# Patient Record
Sex: Female | Born: 1970
Health system: Southern US, Community
[De-identification: ages and names within clinical notes are randomized; demographics above are authoritative.]

## PROBLEM LIST (undated history)

## (undated) ENCOUNTER — Emergency Department (HOSPITAL_COMMUNITY): Admission: EM | Payer: Medicare Other | Source: Home / Self Care

## (undated) DIAGNOSIS — K589 Irritable bowel syndrome without diarrhea: Secondary | ICD-10-CM

## (undated) DIAGNOSIS — I509 Heart failure, unspecified: Secondary | ICD-10-CM

## (undated) DIAGNOSIS — F418 Other specified anxiety disorders: Secondary | ICD-10-CM

## (undated) DIAGNOSIS — D649 Anemia, unspecified: Secondary | ICD-10-CM

## (undated) DIAGNOSIS — I10 Essential (primary) hypertension: Secondary | ICD-10-CM

## (undated) DIAGNOSIS — G629 Polyneuropathy, unspecified: Secondary | ICD-10-CM

## (undated) DIAGNOSIS — M797 Fibromyalgia: Secondary | ICD-10-CM

## (undated) DIAGNOSIS — L732 Hidradenitis suppurativa: Secondary | ICD-10-CM

## (undated) DIAGNOSIS — K219 Gastro-esophageal reflux disease without esophagitis: Secondary | ICD-10-CM

## (undated) DIAGNOSIS — R0989 Other specified symptoms and signs involving the circulatory and respiratory systems: Secondary | ICD-10-CM

## (undated) DIAGNOSIS — J381 Polyp of vocal cord and larynx: Secondary | ICD-10-CM

## (undated) DIAGNOSIS — M545 Low back pain, unspecified: Secondary | ICD-10-CM

## (undated) DIAGNOSIS — G43909 Migraine, unspecified, not intractable, without status migrainosus: Secondary | ICD-10-CM

## (undated) DIAGNOSIS — K635 Polyp of colon: Secondary | ICD-10-CM

## (undated) DIAGNOSIS — F410 Panic disorder [episodic paroxysmal anxiety] without agoraphobia: Secondary | ICD-10-CM

## (undated) DIAGNOSIS — R600 Localized edema: Secondary | ICD-10-CM

## (undated) DIAGNOSIS — N189 Chronic kidney disease, unspecified: Secondary | ICD-10-CM

## (undated) DIAGNOSIS — G709 Myoneural disorder, unspecified: Secondary | ICD-10-CM

## (undated) DIAGNOSIS — R630 Anorexia: Secondary | ICD-10-CM

## (undated) DIAGNOSIS — I639 Cerebral infarction, unspecified: Secondary | ICD-10-CM

## (undated) HISTORY — DX: Polyp of vocal cord and larynx: J38.1

## (undated) HISTORY — DX: Irritable bowel syndrome, unspecified: K58.9

## (undated) HISTORY — DX: Myoneural disorder, unspecified: G70.9

## (undated) HISTORY — PX: AXILLARY HIDRADENITIS EXCISION: SUR522

## (undated) HISTORY — DX: Polyp of colon: K63.5

## (undated) HISTORY — DX: Fibromyalgia: M79.7

## (undated) HISTORY — DX: Low back pain: M54.5

## (undated) HISTORY — DX: Anemia, unspecified: D64.9

## (undated) HISTORY — DX: Low back pain, unspecified: M54.50

## (undated) HISTORY — PX: UPPER GASTROINTESTINAL ENDOSCOPY: SHX188

## (undated) HISTORY — PX: INGUINAL HIDRADENITIS EXCISION: SHX1827

## (undated) HISTORY — DX: Polyneuropathy, unspecified: G62.9

## (undated) HISTORY — DX: Panic disorder (episodic paroxysmal anxiety): F41.0

## (undated) HISTORY — DX: Essential (primary) hypertension: I10

## (undated) HISTORY — DX: Migraine, unspecified, not intractable, without status migrainosus: G43.909

## (undated) HISTORY — PX: HEMORRHOID SURGERY: SHX153

## (undated) HISTORY — DX: Morbid (severe) obesity due to excess calories: E66.01

## (undated) HISTORY — DX: Gastro-esophageal reflux disease without esophagitis: K21.9

## (undated) HISTORY — DX: Localized edema: R60.0

## (undated) HISTORY — DX: Anorexia: R63.0

## (undated) HISTORY — DX: Other specified symptoms and signs involving the circulatory and respiratory systems: R09.89

## (undated) HISTORY — DX: Other specified anxiety disorders: F41.8

## (undated) HISTORY — DX: Hidradenitis suppurativa: L73.2

---

## 1997-03-12 ENCOUNTER — Inpatient Hospital Stay (HOSPITAL_COMMUNITY): Admission: AD | Admit: 1997-03-12 | Discharge: 1997-03-13 | Payer: Self-pay | Admitting: Obstetrics

## 1997-03-24 ENCOUNTER — Ambulatory Visit (HOSPITAL_COMMUNITY): Admission: RE | Admit: 1997-03-24 | Discharge: 1997-03-24 | Payer: Self-pay | Admitting: Obstetrics

## 1997-05-09 ENCOUNTER — Inpatient Hospital Stay (HOSPITAL_COMMUNITY): Admission: AD | Admit: 1997-05-09 | Discharge: 1997-05-09 | Payer: Self-pay | Admitting: Obstetrics

## 1997-05-18 ENCOUNTER — Ambulatory Visit (HOSPITAL_COMMUNITY): Admission: RE | Admit: 1997-05-18 | Discharge: 1997-05-18 | Payer: Self-pay | Admitting: Obstetrics

## 1997-07-14 ENCOUNTER — Other Ambulatory Visit: Admission: RE | Admit: 1997-07-14 | Discharge: 1997-07-14 | Payer: Self-pay | Admitting: Obstetrics

## 1997-07-19 ENCOUNTER — Emergency Department (HOSPITAL_COMMUNITY): Admission: EM | Admit: 1997-07-19 | Discharge: 1997-07-19 | Payer: Self-pay | Admitting: Emergency Medicine

## 1997-08-16 ENCOUNTER — Inpatient Hospital Stay (HOSPITAL_COMMUNITY): Admission: AD | Admit: 1997-08-16 | Discharge: 1997-08-19 | Payer: Self-pay | Admitting: Obstetrics

## 1998-01-12 ENCOUNTER — Encounter: Admission: RE | Admit: 1998-01-12 | Discharge: 1998-01-12 | Payer: Self-pay | Admitting: Hematology and Oncology

## 1998-04-06 ENCOUNTER — Ambulatory Visit (HOSPITAL_BASED_OUTPATIENT_CLINIC_OR_DEPARTMENT_OTHER): Admission: RE | Admit: 1998-04-06 | Discharge: 1998-04-06 | Payer: Self-pay | Admitting: General Surgery

## 1998-05-31 ENCOUNTER — Encounter: Admission: RE | Admit: 1998-05-31 | Discharge: 1998-05-31 | Payer: Self-pay | Admitting: Hematology and Oncology

## 1998-06-09 ENCOUNTER — Encounter: Admission: RE | Admit: 1998-06-09 | Discharge: 1998-06-09 | Payer: Self-pay | Admitting: Internal Medicine

## 1998-06-12 ENCOUNTER — Encounter: Payer: Self-pay | Admitting: *Deleted

## 1998-06-12 ENCOUNTER — Ambulatory Visit (HOSPITAL_COMMUNITY): Admission: RE | Admit: 1998-06-12 | Discharge: 1998-06-12 | Payer: Self-pay | Admitting: *Deleted

## 1998-06-12 ENCOUNTER — Encounter: Admission: RE | Admit: 1998-06-12 | Discharge: 1998-06-12 | Payer: Self-pay | Admitting: Internal Medicine

## 1998-06-27 ENCOUNTER — Encounter: Admission: RE | Admit: 1998-06-27 | Discharge: 1998-06-27 | Payer: Self-pay | Admitting: Obstetrics

## 1998-07-10 ENCOUNTER — Other Ambulatory Visit: Admission: RE | Admit: 1998-07-10 | Discharge: 1998-07-10 | Payer: Self-pay | Admitting: Obstetrics

## 1998-08-07 ENCOUNTER — Ambulatory Visit (HOSPITAL_COMMUNITY): Admission: RE | Admit: 1998-08-07 | Discharge: 1998-08-07 | Payer: Self-pay | Admitting: General Surgery

## 1998-08-07 ENCOUNTER — Encounter (HOSPITAL_BASED_OUTPATIENT_CLINIC_OR_DEPARTMENT_OTHER): Payer: Self-pay | Admitting: General Surgery

## 1998-08-18 ENCOUNTER — Ambulatory Visit (HOSPITAL_COMMUNITY): Admission: RE | Admit: 1998-08-18 | Discharge: 1998-08-18 | Payer: Self-pay | Admitting: *Deleted

## 1998-09-01 ENCOUNTER — Encounter: Payer: Self-pay | Admitting: *Deleted

## 1998-09-01 ENCOUNTER — Ambulatory Visit (HOSPITAL_COMMUNITY): Admission: RE | Admit: 1998-09-01 | Discharge: 1998-09-01 | Payer: Self-pay | Admitting: *Deleted

## 1998-11-01 ENCOUNTER — Inpatient Hospital Stay (HOSPITAL_COMMUNITY): Admission: AD | Admit: 1998-11-01 | Discharge: 1998-11-01 | Payer: Self-pay | Admitting: *Deleted

## 1999-01-03 ENCOUNTER — Encounter: Payer: Self-pay | Admitting: Emergency Medicine

## 1999-01-03 ENCOUNTER — Emergency Department (HOSPITAL_COMMUNITY): Admission: EM | Admit: 1999-01-03 | Discharge: 1999-01-03 | Payer: Self-pay | Admitting: Emergency Medicine

## 1999-01-10 ENCOUNTER — Encounter: Admission: RE | Admit: 1999-01-10 | Discharge: 1999-01-10 | Payer: Self-pay | Admitting: Internal Medicine

## 1999-01-13 ENCOUNTER — Emergency Department (HOSPITAL_COMMUNITY): Admission: EM | Admit: 1999-01-13 | Discharge: 1999-01-13 | Payer: Self-pay | Admitting: Emergency Medicine

## 1999-02-09 ENCOUNTER — Encounter: Admission: RE | Admit: 1999-02-09 | Discharge: 1999-02-09 | Payer: Self-pay | Admitting: Internal Medicine

## 1999-03-07 ENCOUNTER — Ambulatory Visit (HOSPITAL_COMMUNITY): Admission: RE | Admit: 1999-03-07 | Discharge: 1999-03-07 | Payer: Self-pay | Admitting: Internal Medicine

## 1999-03-09 ENCOUNTER — Encounter: Admission: RE | Admit: 1999-03-09 | Discharge: 1999-03-09 | Payer: Self-pay | Admitting: Internal Medicine

## 1999-04-06 ENCOUNTER — Encounter: Admission: RE | Admit: 1999-04-06 | Discharge: 1999-04-06 | Payer: Self-pay | Admitting: Internal Medicine

## 1999-05-04 ENCOUNTER — Encounter: Admission: RE | Admit: 1999-05-04 | Discharge: 1999-05-04 | Payer: Self-pay | Admitting: Hematology and Oncology

## 1999-05-11 ENCOUNTER — Encounter (INDEPENDENT_AMBULATORY_CARE_PROVIDER_SITE_OTHER): Payer: Self-pay | Admitting: Specialist

## 1999-05-12 ENCOUNTER — Observation Stay (HOSPITAL_COMMUNITY): Admission: EM | Admit: 1999-05-12 | Discharge: 1999-05-12 | Payer: Self-pay | Admitting: Emergency Medicine

## 1999-07-30 ENCOUNTER — Encounter: Admission: RE | Admit: 1999-07-30 | Discharge: 1999-07-30 | Payer: Self-pay | Admitting: Internal Medicine

## 1999-08-03 ENCOUNTER — Inpatient Hospital Stay (HOSPITAL_COMMUNITY): Admission: EM | Admit: 1999-08-03 | Discharge: 1999-08-03 | Payer: Self-pay | Admitting: *Deleted

## 1999-08-23 ENCOUNTER — Encounter: Admission: RE | Admit: 1999-08-23 | Discharge: 1999-08-23 | Payer: Self-pay | Admitting: Hematology and Oncology

## 2000-03-04 ENCOUNTER — Encounter: Admission: RE | Admit: 2000-03-04 | Discharge: 2000-03-04 | Payer: Self-pay | Admitting: Hematology and Oncology

## 2000-03-13 ENCOUNTER — Encounter: Admission: RE | Admit: 2000-03-13 | Discharge: 2000-03-13 | Payer: Self-pay

## 2000-03-15 ENCOUNTER — Emergency Department (HOSPITAL_COMMUNITY): Admission: EM | Admit: 2000-03-15 | Discharge: 2000-03-15 | Payer: Self-pay | Admitting: Emergency Medicine

## 2000-03-15 ENCOUNTER — Encounter: Payer: Self-pay | Admitting: Emergency Medicine

## 2000-03-17 ENCOUNTER — Encounter: Admission: RE | Admit: 2000-03-17 | Discharge: 2000-03-17 | Payer: Self-pay | Admitting: Internal Medicine

## 2000-03-18 ENCOUNTER — Encounter: Admission: RE | Admit: 2000-03-18 | Discharge: 2000-03-18 | Payer: Self-pay | Admitting: Obstetrics & Gynecology

## 2000-03-18 ENCOUNTER — Ambulatory Visit (HOSPITAL_COMMUNITY): Admission: RE | Admit: 2000-03-18 | Discharge: 2000-03-18 | Payer: Self-pay | Admitting: Obstetrics

## 2000-03-19 ENCOUNTER — Encounter: Admission: RE | Admit: 2000-03-19 | Discharge: 2000-03-19 | Payer: Self-pay | Admitting: Internal Medicine

## 2000-04-30 ENCOUNTER — Encounter: Admission: RE | Admit: 2000-04-30 | Discharge: 2000-07-29 | Payer: Self-pay | Admitting: *Deleted

## 2000-05-01 ENCOUNTER — Encounter: Admission: RE | Admit: 2000-05-01 | Discharge: 2000-05-01 | Payer: Self-pay | Admitting: Hematology and Oncology

## 2000-05-22 ENCOUNTER — Encounter: Admission: RE | Admit: 2000-05-22 | Discharge: 2000-05-22 | Payer: Self-pay | Admitting: Hematology and Oncology

## 2000-05-23 ENCOUNTER — Ambulatory Visit (HOSPITAL_COMMUNITY): Admission: RE | Admit: 2000-05-23 | Discharge: 2000-05-23 | Payer: Self-pay | Admitting: Internal Medicine

## 2000-05-23 ENCOUNTER — Encounter: Payer: Self-pay | Admitting: Internal Medicine

## 2000-06-04 ENCOUNTER — Encounter: Admission: RE | Admit: 2000-06-04 | Discharge: 2000-06-04 | Payer: Self-pay | Admitting: Hematology and Oncology

## 2000-07-11 ENCOUNTER — Encounter: Admission: RE | Admit: 2000-07-11 | Discharge: 2000-07-11 | Payer: Self-pay | Admitting: Internal Medicine

## 2000-08-21 ENCOUNTER — Encounter: Admission: RE | Admit: 2000-08-21 | Discharge: 2000-08-21 | Payer: Self-pay | Admitting: Internal Medicine

## 2000-09-07 ENCOUNTER — Emergency Department (HOSPITAL_COMMUNITY): Admission: EM | Admit: 2000-09-07 | Discharge: 2000-09-07 | Payer: Self-pay | Admitting: Emergency Medicine

## 2000-09-11 ENCOUNTER — Encounter: Admission: RE | Admit: 2000-09-11 | Discharge: 2000-09-11 | Payer: Self-pay | Admitting: Internal Medicine

## 2000-10-08 ENCOUNTER — Encounter: Admission: RE | Admit: 2000-10-08 | Discharge: 2000-10-08 | Payer: Self-pay | Admitting: Internal Medicine

## 2000-12-23 ENCOUNTER — Encounter: Admission: RE | Admit: 2000-12-23 | Discharge: 2000-12-23 | Payer: Self-pay | Admitting: Internal Medicine

## 2000-12-25 ENCOUNTER — Emergency Department (HOSPITAL_COMMUNITY): Admission: EM | Admit: 2000-12-25 | Discharge: 2000-12-25 | Payer: Self-pay | Admitting: Emergency Medicine

## 2000-12-25 ENCOUNTER — Encounter: Payer: Self-pay | Admitting: Emergency Medicine

## 2001-03-11 ENCOUNTER — Encounter: Payer: Self-pay | Admitting: Emergency Medicine

## 2001-03-11 ENCOUNTER — Emergency Department (HOSPITAL_COMMUNITY): Admission: EM | Admit: 2001-03-11 | Discharge: 2001-03-11 | Payer: Self-pay | Admitting: Emergency Medicine

## 2001-03-31 ENCOUNTER — Encounter: Admission: RE | Admit: 2001-03-31 | Discharge: 2001-03-31 | Payer: Self-pay | Admitting: Internal Medicine

## 2001-07-30 ENCOUNTER — Encounter: Admission: RE | Admit: 2001-07-30 | Discharge: 2001-07-30 | Payer: Self-pay | Admitting: Internal Medicine

## 2001-07-30 ENCOUNTER — Ambulatory Visit (HOSPITAL_COMMUNITY): Admission: RE | Admit: 2001-07-30 | Discharge: 2001-07-30 | Payer: Self-pay | Admitting: Internal Medicine

## 2001-07-30 ENCOUNTER — Encounter: Payer: Self-pay | Admitting: Internal Medicine

## 2001-09-11 ENCOUNTER — Emergency Department (HOSPITAL_COMMUNITY): Admission: EM | Admit: 2001-09-11 | Discharge: 2001-09-11 | Payer: Self-pay | Admitting: Emergency Medicine

## 2001-09-13 ENCOUNTER — Emergency Department (HOSPITAL_COMMUNITY): Admission: EM | Admit: 2001-09-13 | Discharge: 2001-09-13 | Payer: Self-pay | Admitting: Emergency Medicine

## 2001-09-26 ENCOUNTER — Emergency Department (HOSPITAL_COMMUNITY): Admission: EM | Admit: 2001-09-26 | Discharge: 2001-09-27 | Payer: Self-pay | Admitting: Emergency Medicine

## 2001-09-27 ENCOUNTER — Emergency Department (HOSPITAL_COMMUNITY): Admission: EM | Admit: 2001-09-27 | Discharge: 2001-09-27 | Payer: Self-pay | Admitting: Emergency Medicine

## 2001-12-08 ENCOUNTER — Encounter: Admission: RE | Admit: 2001-12-08 | Discharge: 2001-12-08 | Payer: Self-pay | Admitting: Internal Medicine

## 2001-12-14 ENCOUNTER — Encounter: Admission: RE | Admit: 2001-12-14 | Discharge: 2001-12-14 | Payer: Self-pay | Admitting: Urology

## 2001-12-14 ENCOUNTER — Encounter: Payer: Self-pay | Admitting: Urology

## 2001-12-14 IMAGING — CT CT PELVIS W/O CM
1 series · 16 of 32 positions shown, 20 images · non-contrast
Comparison: none

FINDINGS
CLINICAL DATA: HEMATURIA, BACK PAIN.
CT ABDOMEN WITHOUT CONTRAST
MULTIDETECTOR HELICAL SCANS THROUGH THE ABDOMEN WERE PERFORMED IN THE UNENHANCED STATE, USING
PROTOCOL TO EVALUATE FOR RENAL OR URETERAL CALCULI.
THE LUNG BASES ARE CLEAR.  THE LIVER IS GROSSLY NORMAL IN THE UNENHANCED STATE.  THE PANCREAS AND
ADRENAL GLANDS ARE GROSSLY NORMAL.  SCANNING THROUGH THE KIDNEYS, NO RENAL CALCULI AR SEEN.  THERE
IS NO EVIDENCE OF HYDRONEPHROSIS.  THE URETERS ARE NORMAL IN CALIBER. THE ABDOMINAL AORTA IS NORMAL.
IMPRESSION
NEGATIVE CT OF THE ABDOMEN.  NO RENAL OR URETERAL CALCULI NOTED.  NO HYDRONEPHROSIS.
CT PELVIS WITHOUT CONTRAST
SCANS WERE CONTINUED THROUGH THE PELVIS IN THE UNENHANCED STATE. THE DISTAL URETERS ARE NORMAL IN
CALIBER.  THERE ARE CALCIFICATIONS IN THE BONY PELVIS WHICH APPEAR MOST TYPICAL OF PHLEBOLITHS. THE
UTERUS IS NORMAL IN SIZE. THE URINARY BLADDER IS UNREMARKABLE.  NO FLUID IS SEEN WITHIN THE PELVIS.
NEGATIVE CT OF THE PELVIS.  NO EVIDENCE OF DISTAL URETERAL CALCULI IS SEEN.

[Series 2: renal stone · axial · 0.94mm/px · z∈[-421,-116]mm · 16 of 69 slices shown, 20 images]
[im 5/69  soft-tissue]
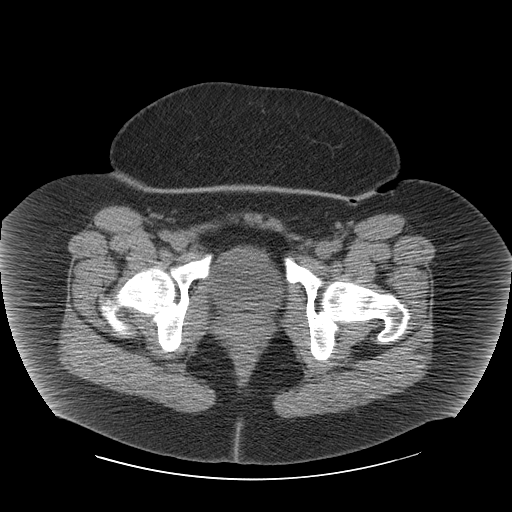
[im 5/69  bone]
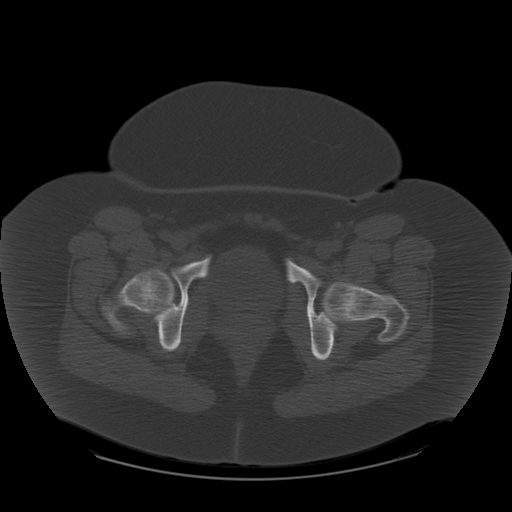
[im 9/69  soft-tissue]
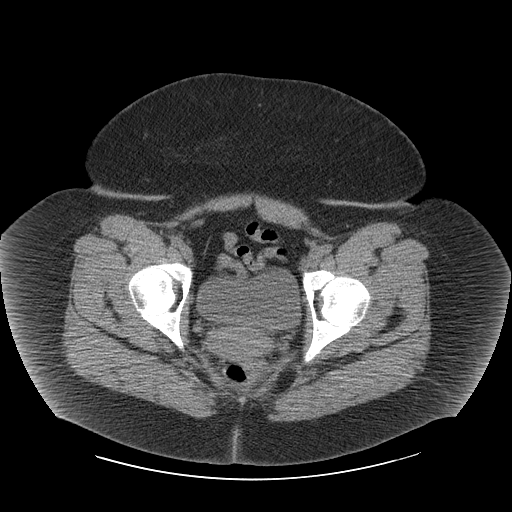
[im 14/69  soft-tissue]
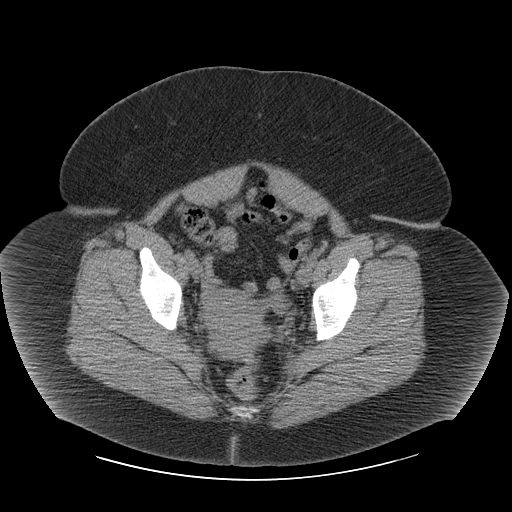
[im 18/69  soft-tissue]
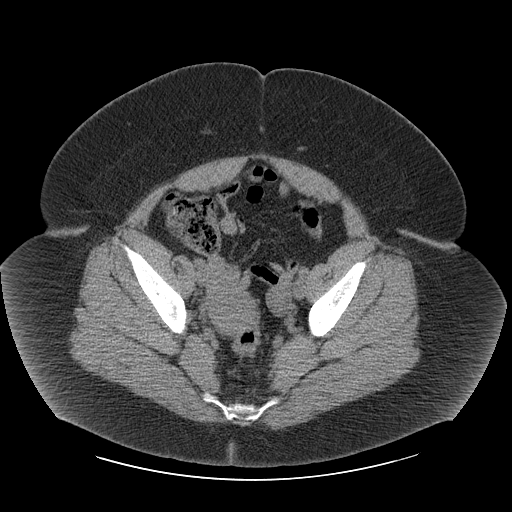
[im 22/69  soft-tissue]
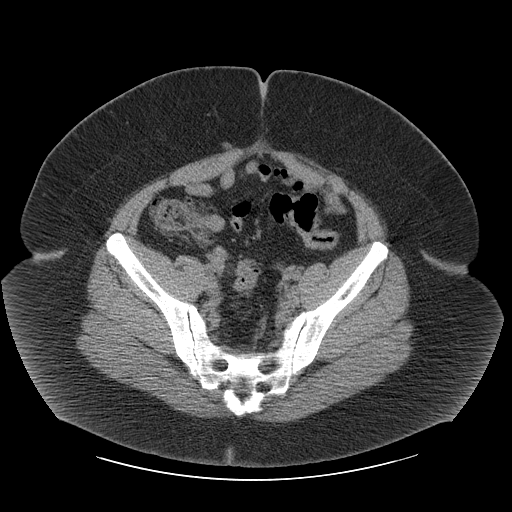
[im 27/69  soft-tissue]
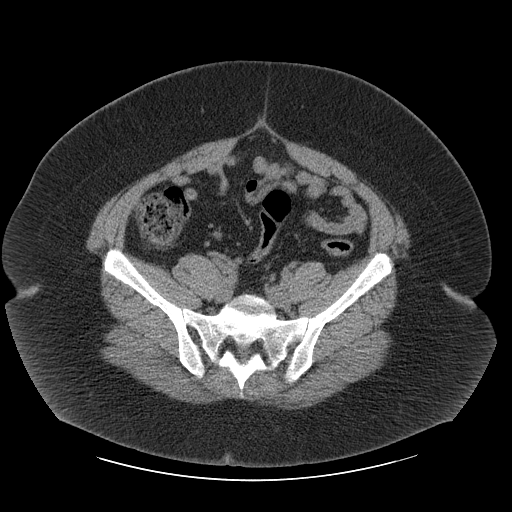
[im 31/69  soft-tissue]
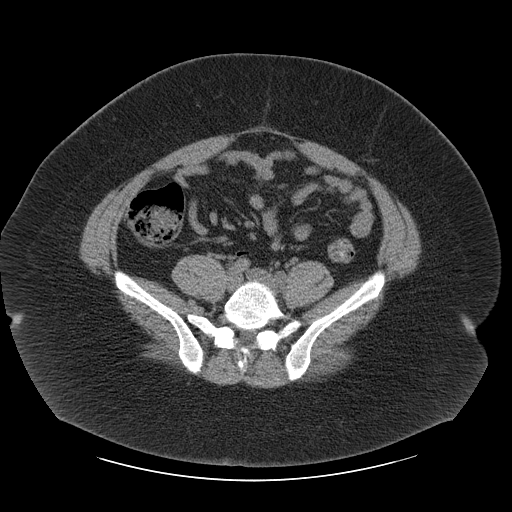
[im 38/69  soft-tissue]
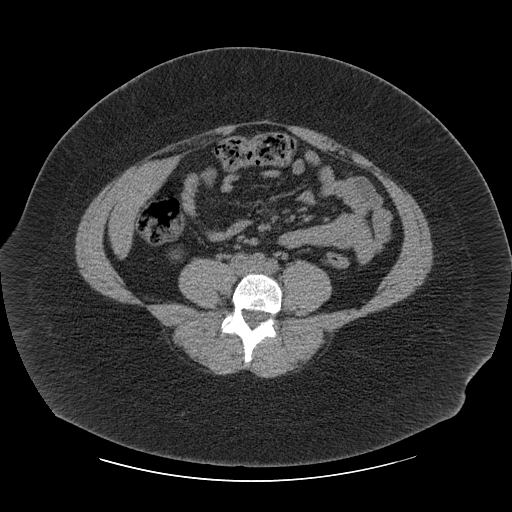
[im 42/69  soft-tissue]
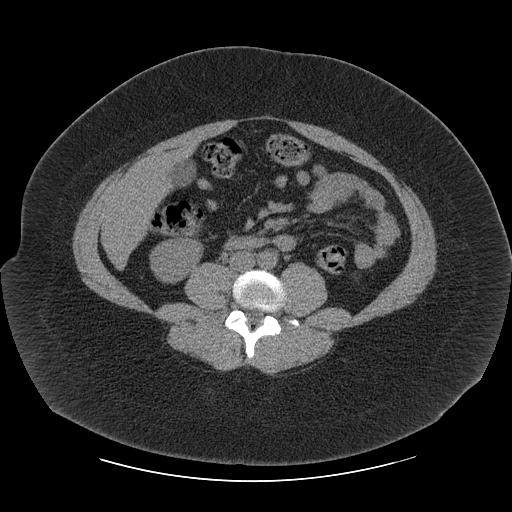
[im 42/69  bone]
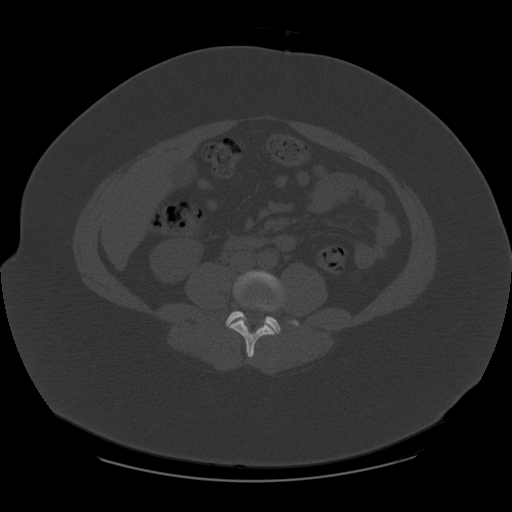
[im 47/69  soft-tissue]
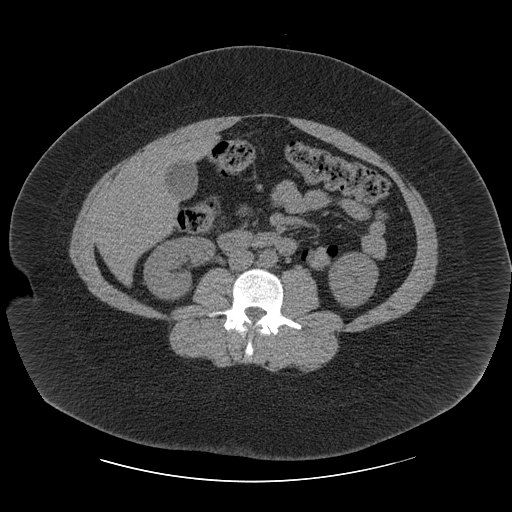
[im 51/69  soft-tissue]
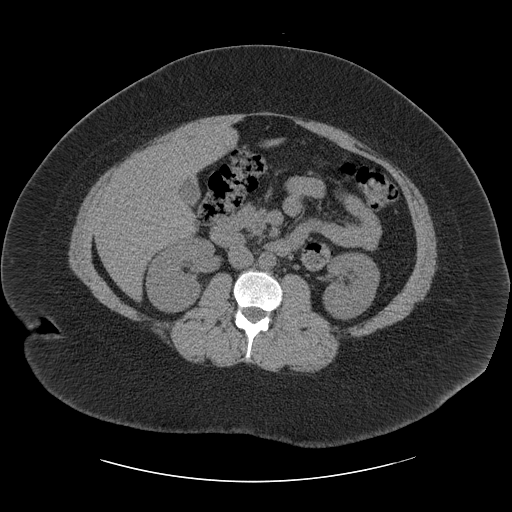
[im 55/69  soft-tissue]
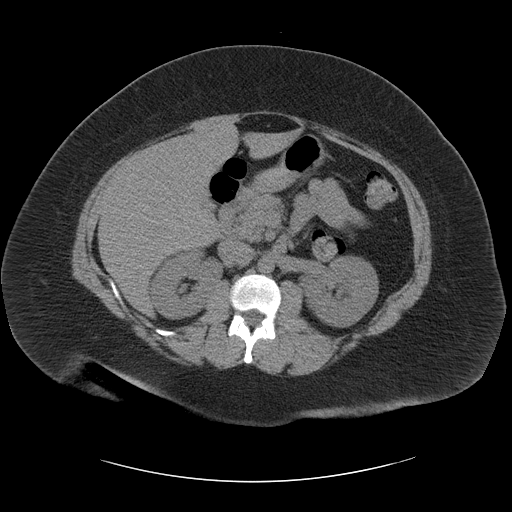
[im 60/69  soft-tissue]
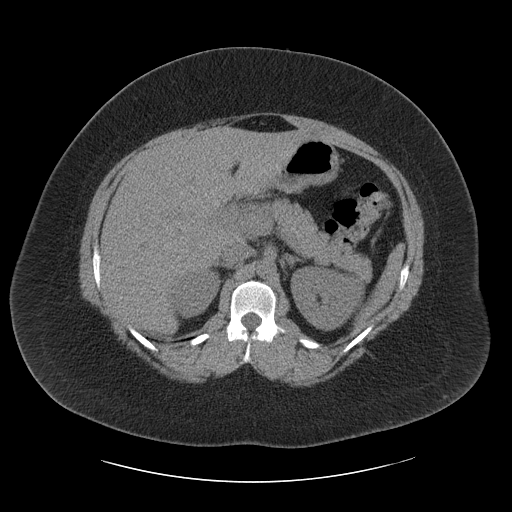
[im 60/69  lung]
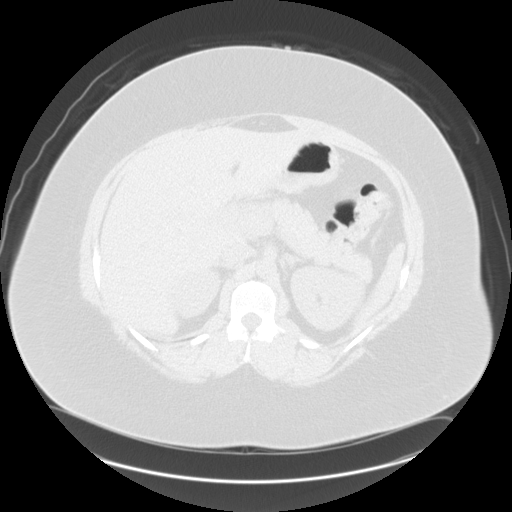
[im 62/69  lung]
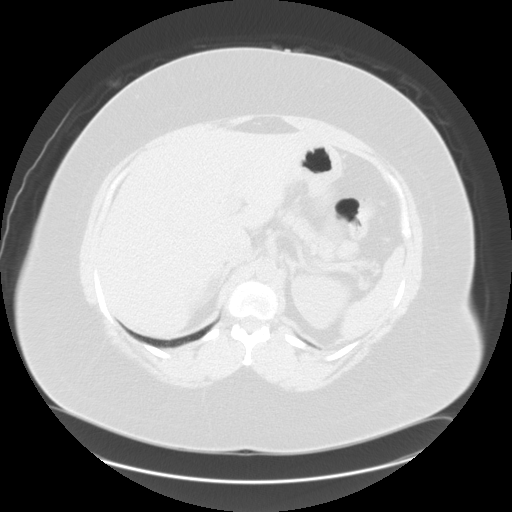
[im 64/69  soft-tissue]
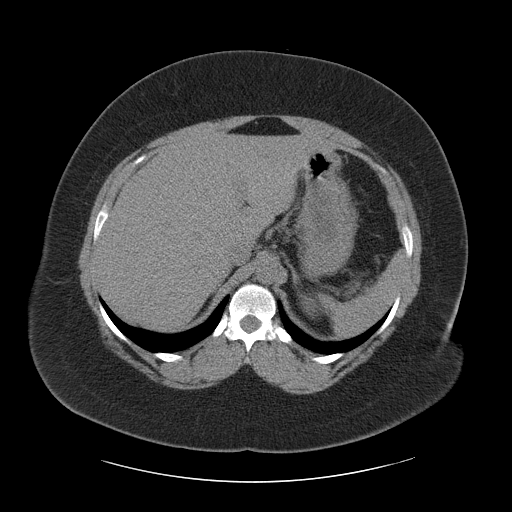
[im 64/69  lung]
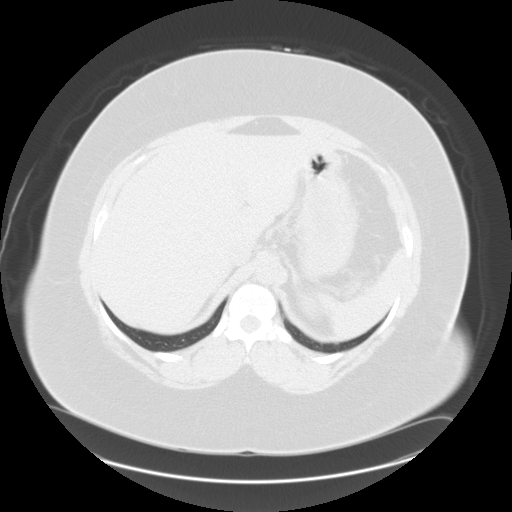
[im 66/69  lung]
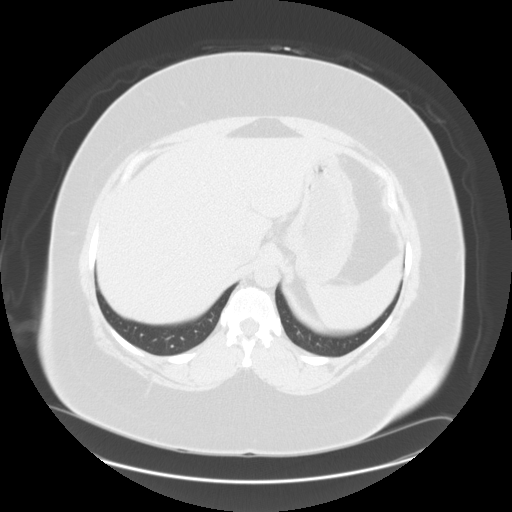

[16 of 32 positions shown; findings below may reference images not displayed]

## 2002-01-15 ENCOUNTER — Encounter: Admission: RE | Admit: 2002-01-15 | Discharge: 2002-01-15 | Payer: Self-pay | Admitting: Internal Medicine

## 2002-05-24 ENCOUNTER — Encounter: Admission: RE | Admit: 2002-05-24 | Discharge: 2002-05-24 | Payer: Self-pay | Admitting: Internal Medicine

## 2002-05-28 ENCOUNTER — Encounter: Admission: RE | Admit: 2002-05-28 | Discharge: 2002-05-28 | Payer: Self-pay | Admitting: Internal Medicine

## 2002-06-14 ENCOUNTER — Encounter: Admission: RE | Admit: 2002-06-14 | Discharge: 2002-06-14 | Payer: Self-pay | Admitting: Internal Medicine

## 2002-06-24 ENCOUNTER — Encounter: Admission: RE | Admit: 2002-06-24 | Discharge: 2002-06-24 | Payer: Self-pay | Admitting: Internal Medicine

## 2002-07-14 ENCOUNTER — Emergency Department (HOSPITAL_COMMUNITY): Admission: EM | Admit: 2002-07-14 | Discharge: 2002-07-14 | Payer: Self-pay | Admitting: Emergency Medicine

## 2002-08-20 ENCOUNTER — Emergency Department (HOSPITAL_COMMUNITY): Admission: EM | Admit: 2002-08-20 | Discharge: 2002-08-20 | Payer: Self-pay

## 2002-12-29 ENCOUNTER — Encounter: Admission: RE | Admit: 2002-12-29 | Discharge: 2002-12-29 | Payer: Self-pay | Admitting: Internal Medicine

## 2003-01-18 ENCOUNTER — Encounter: Admission: RE | Admit: 2003-01-18 | Discharge: 2003-01-18 | Payer: Self-pay | Admitting: Internal Medicine

## 2003-01-31 ENCOUNTER — Encounter: Admission: RE | Admit: 2003-01-31 | Discharge: 2003-01-31 | Payer: Self-pay | Admitting: Internal Medicine

## 2003-03-27 ENCOUNTER — Emergency Department (HOSPITAL_COMMUNITY): Admission: EM | Admit: 2003-03-27 | Discharge: 2003-03-27 | Payer: Self-pay | Admitting: Emergency Medicine

## 2003-03-27 IMAGING — US US ABDOMEN COMPLETE
1 series · 14 of 25 positions shown · non-contrast
Comparison: [DATE].

CLINICAL DATA: Right upper quadrant abdominal pain.  
 ABDOMINAL ULTRASOUND COMPLETE

[Series 1: abdomen · 0.27mm/px · 14 of 75 slices shown]
[im 1/75]
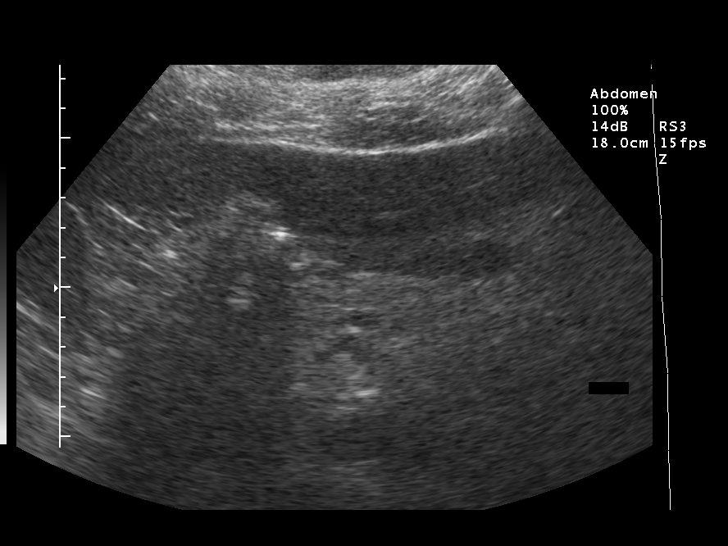
[im 7/75]
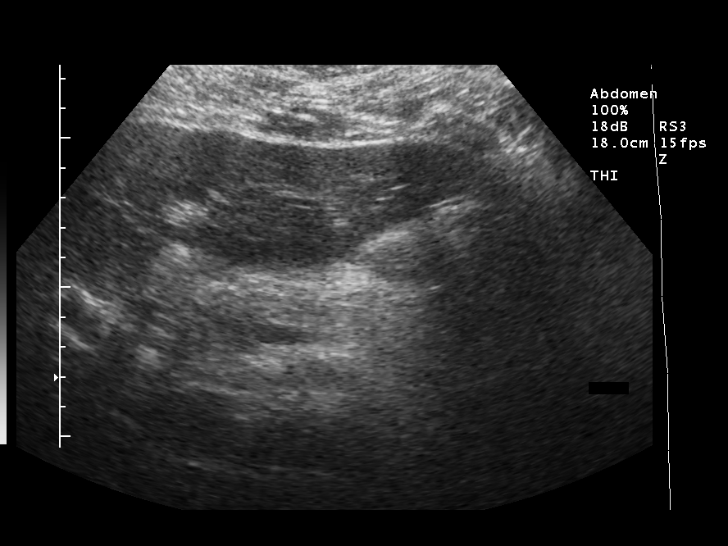
[im 13/75]
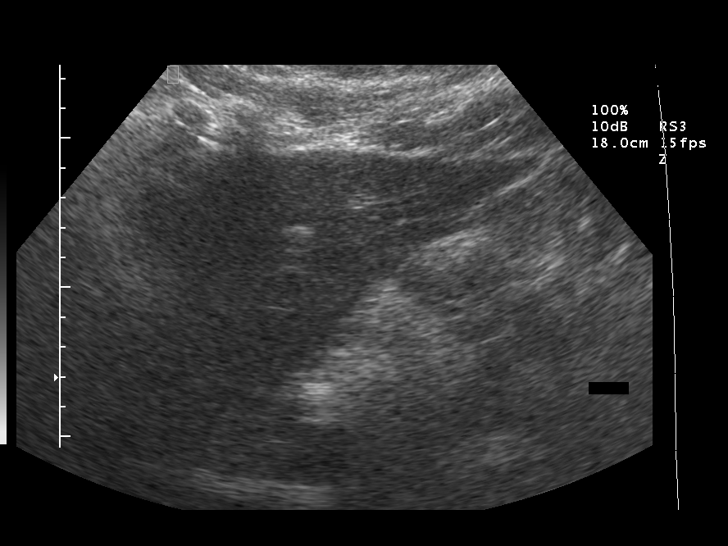
[im 19/75]
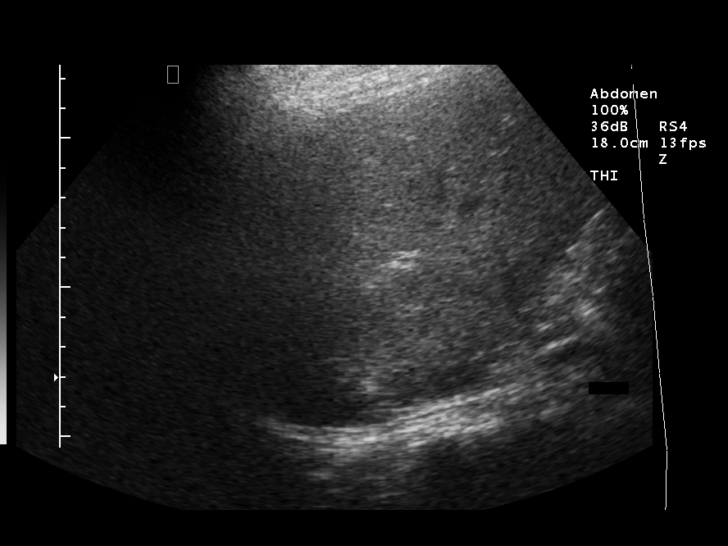
[im 25/75]
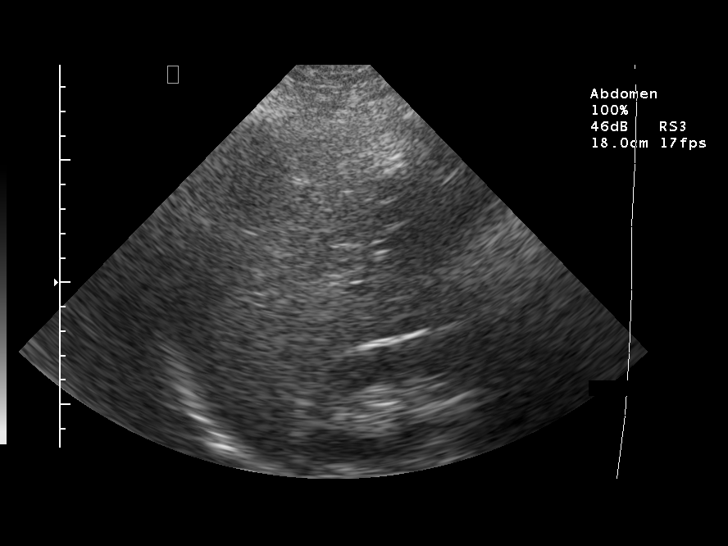
[im 28/75]
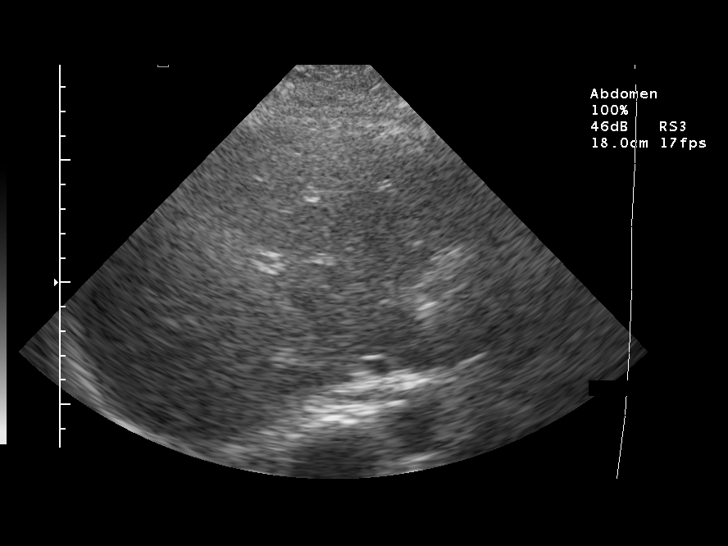
[im 34/75]
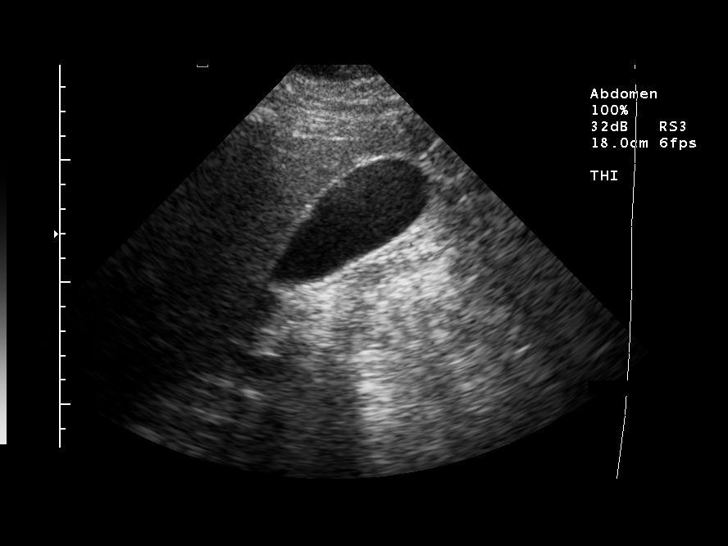
[im 41/75]
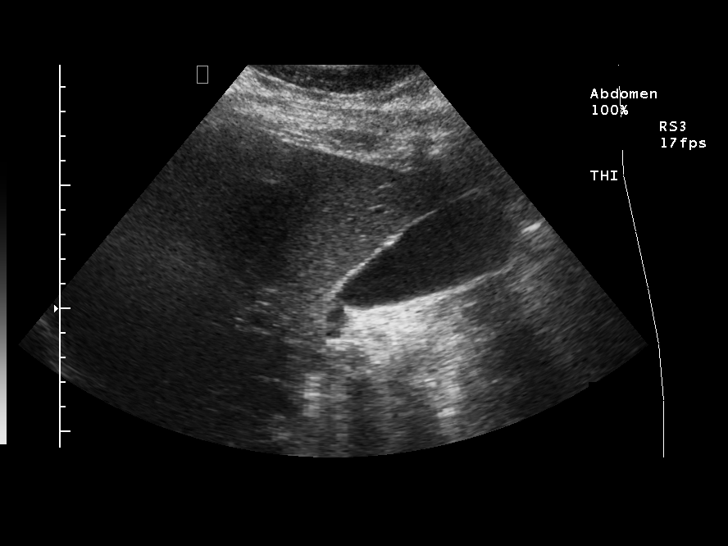
[im 47/75]
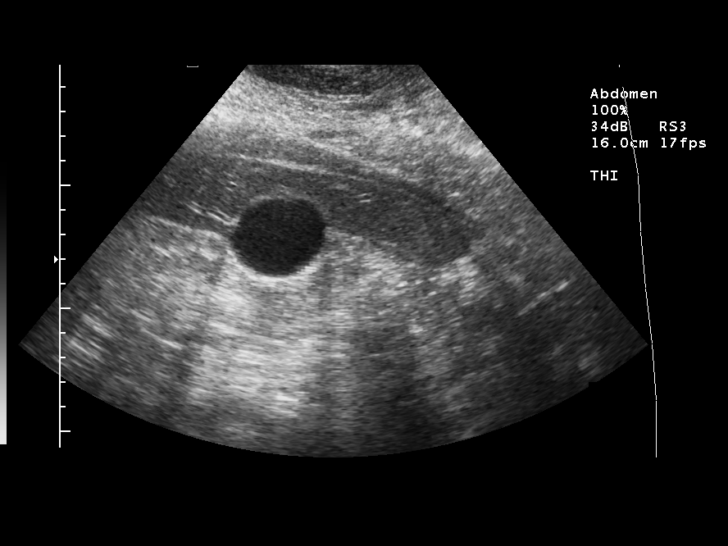
[im 50/75]
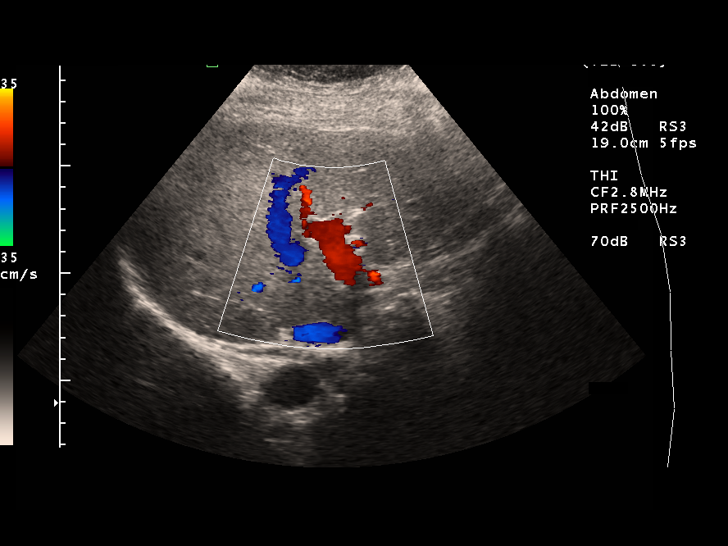
[im 56/75]
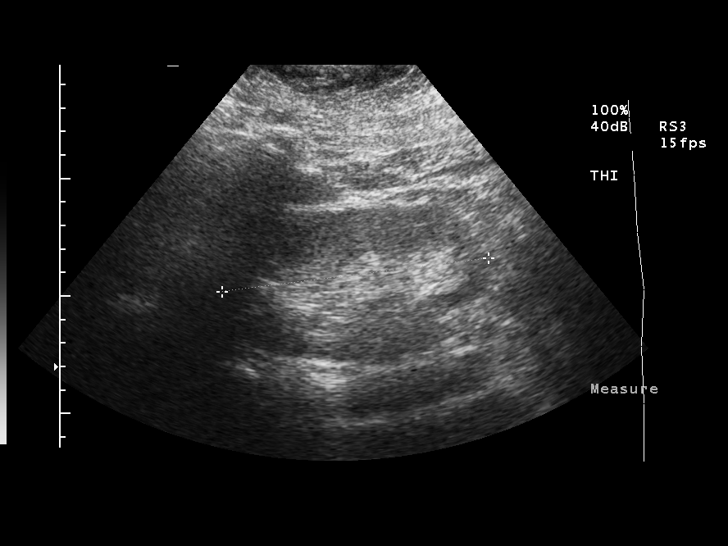
[im 62/75]
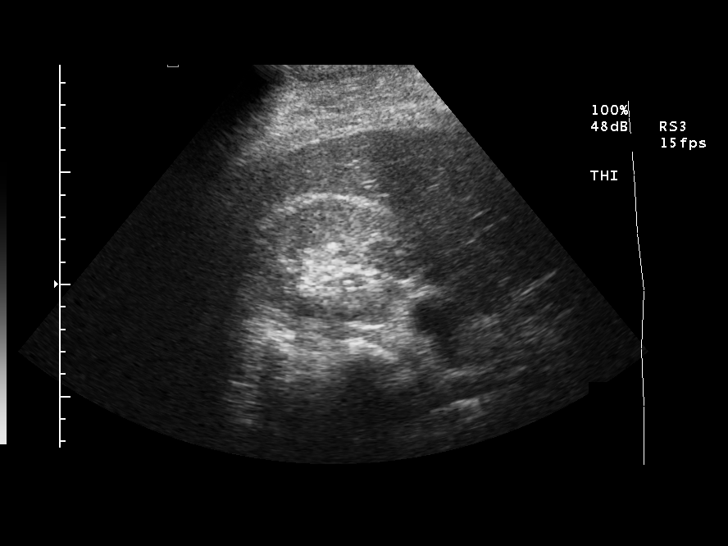
[im 68/75]
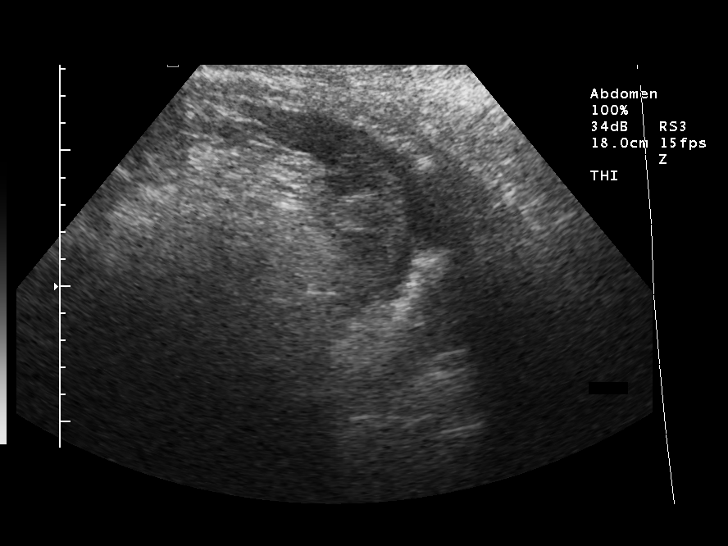
[im 75/75]
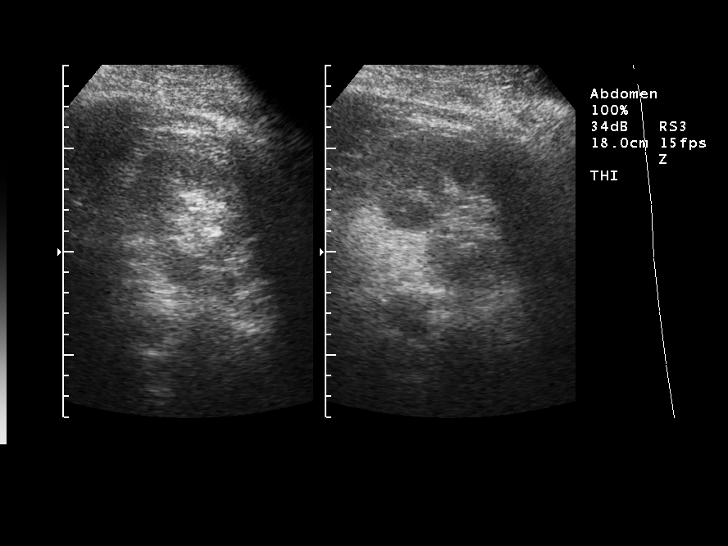

[14 of 25 positions shown; findings below may reference images not displayed]

Gallbladder remains normal in appearance without evidence of cholelithiasis, wall thickening or surrounding fluid.  There is no biliary dilatation.  The hepatic echogenicity is increased, most likely due to fatty infiltration.  No focal hepatic abnormalities are seen.  The IVC and visualized aorta appear unremarkable.  Distal aorta and the pancreatic body and tail are obscured by bowel gas.  The pancreatic head appears unremarkable.  The spleen and kidneys appear normal.  The right kidney measures 11.4 cm in length and the left kidney 13.3 cm.  
 IMPRESSION
 Fatty infiltration of the liver.  No evidence of gallbladder or biliary disease.  Portions of the pancreas and aorta are obscured by bowel gas.  There is a small amount of perihepatic ascites.

## 2003-03-30 ENCOUNTER — Encounter: Admission: RE | Admit: 2003-03-30 | Discharge: 2003-03-30 | Payer: Self-pay | Admitting: Internal Medicine

## 2003-06-05 IMAGING — CR DG CHEST 2V
2 series · 2 of 2 positions shown · non-contrast
Comparison: none

CLINICAL DATA: 33 year old female with right chest pain, shortness of breath and sore throat. 
 CHEST (2 VIEW):

[w chest pa]
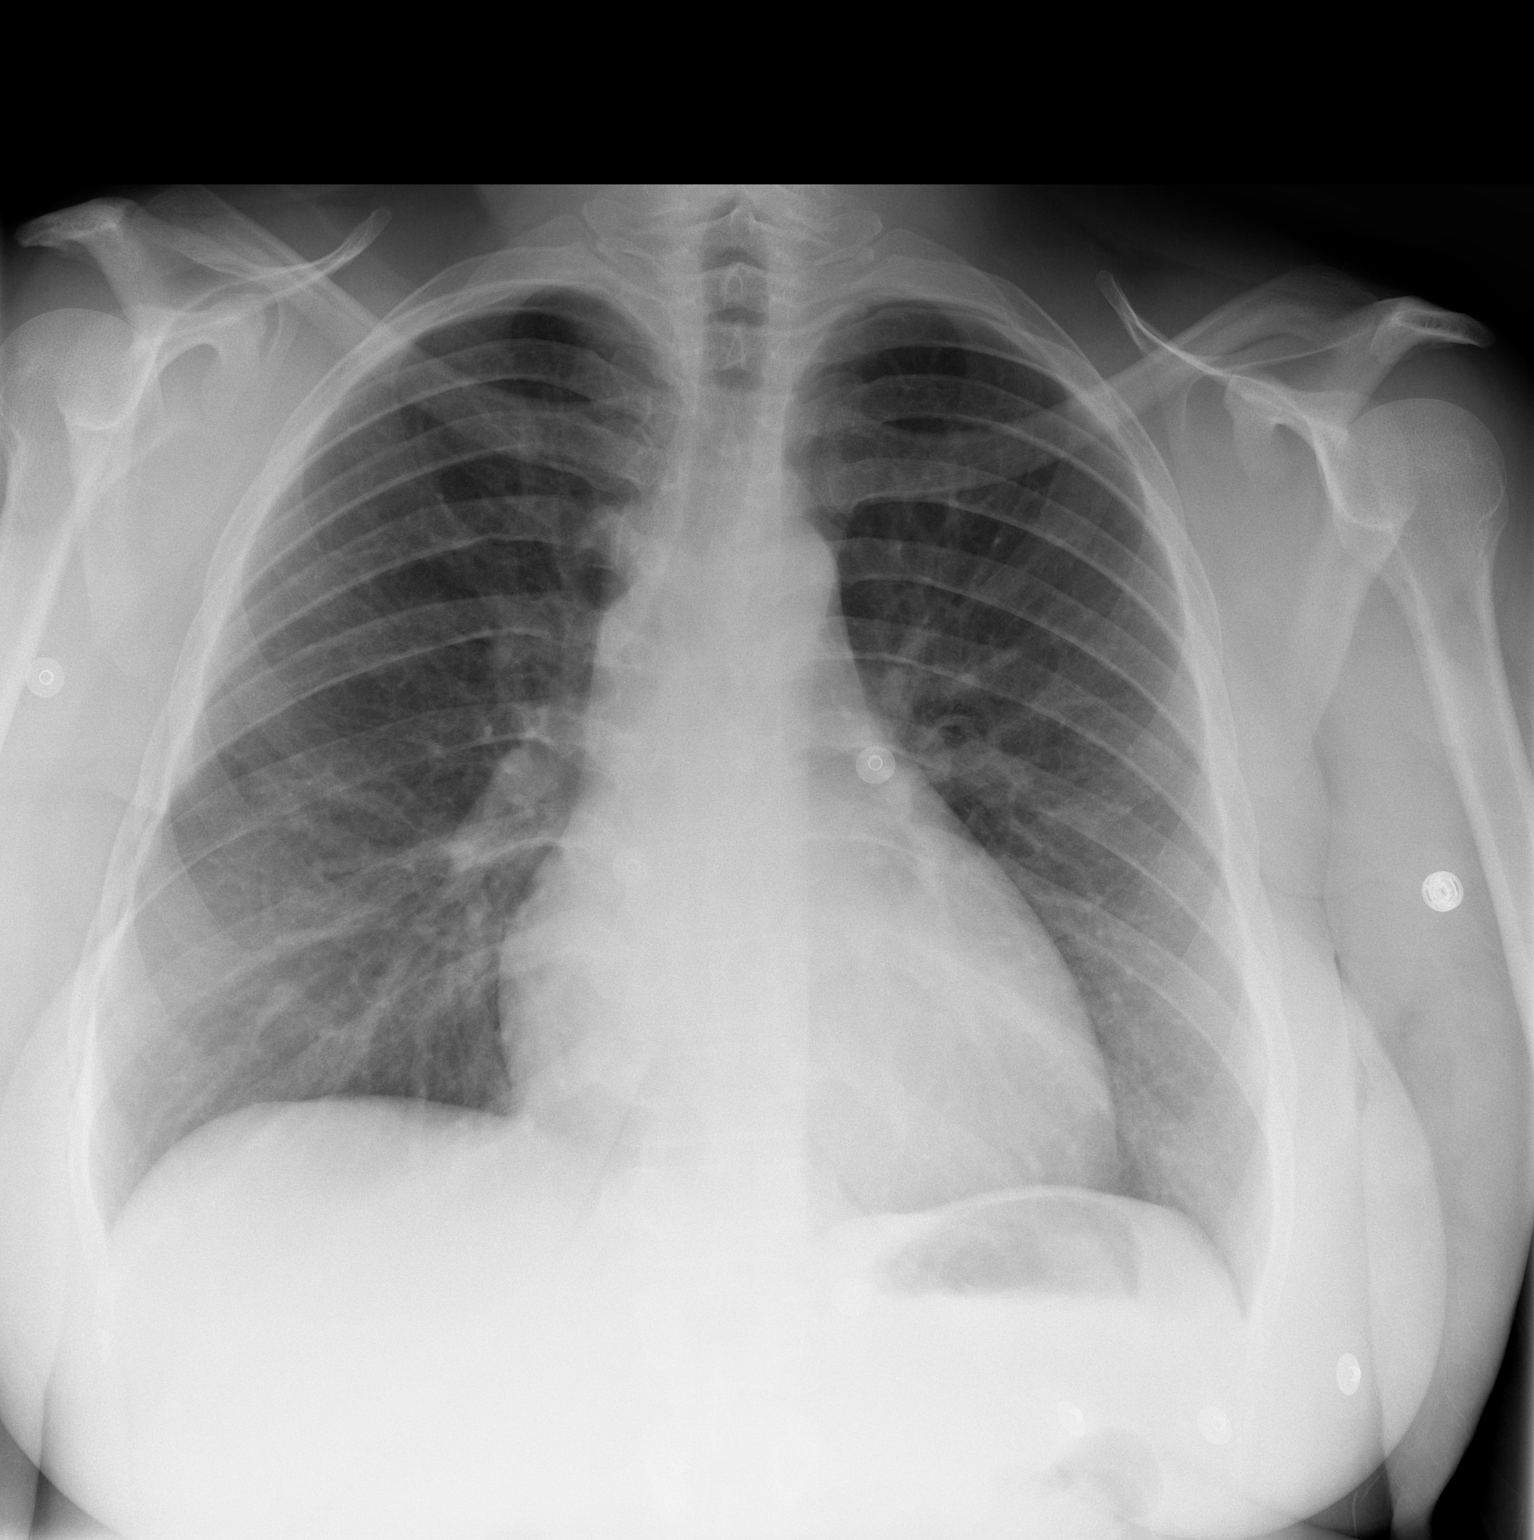

[w chest lat]
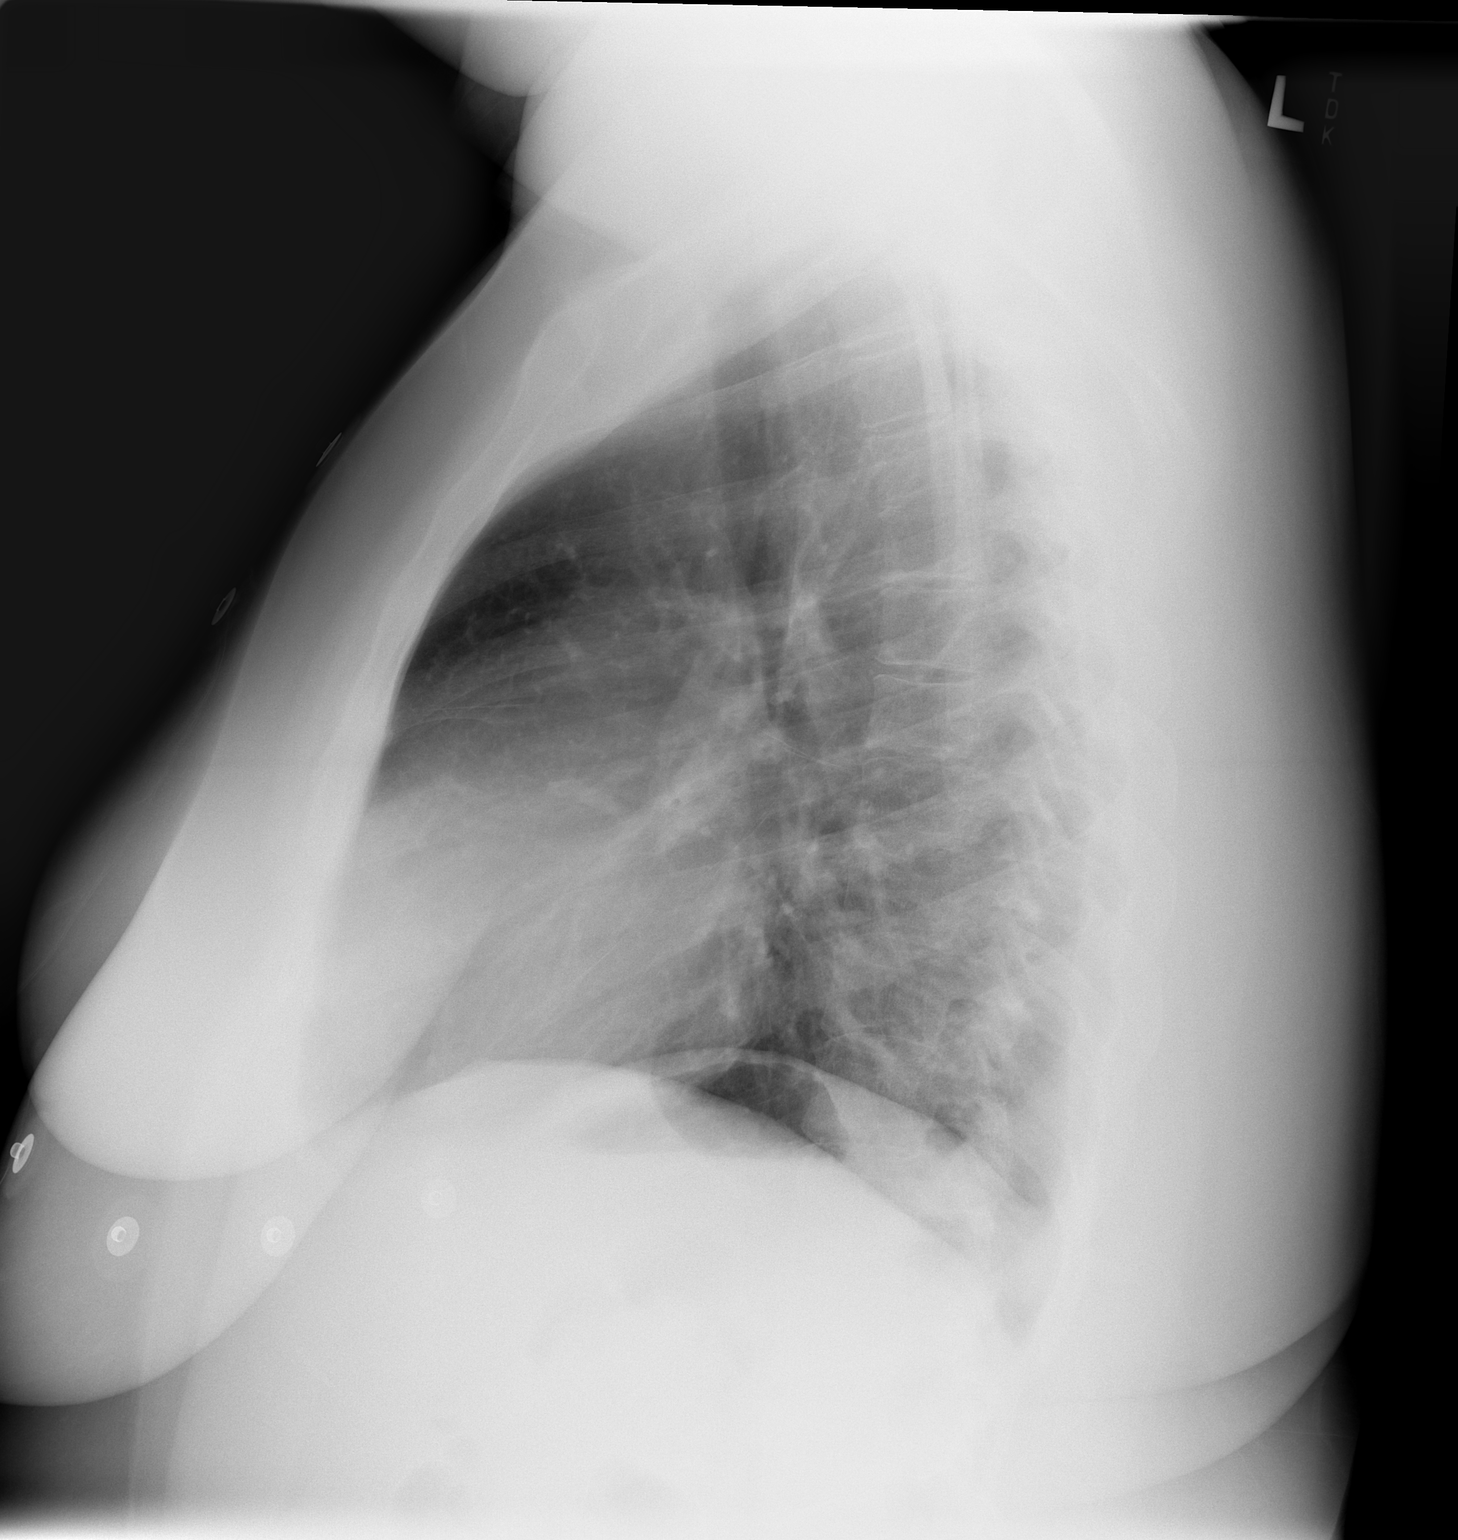

[2 of 2 positions shown; findings below may reference images not displayed]

FINDINGS: The lungs are clear. Heart size is prominent.  No acute pneumonia, consolidation, effusion or pneumothorax.
IMPRESSION: No acute chest disease.

## 2003-07-01 ENCOUNTER — Encounter: Admission: RE | Admit: 2003-07-01 | Discharge: 2003-07-01 | Payer: Self-pay | Admitting: Internal Medicine

## 2003-10-13 ENCOUNTER — Emergency Department (HOSPITAL_COMMUNITY): Admission: EM | Admit: 2003-10-13 | Discharge: 2003-10-13 | Payer: Self-pay | Admitting: Emergency Medicine

## 2003-10-13 IMAGING — CR DG LUMBAR SPINE COMPLETE 4+V
5 series · 5 of 5 positions shown · non-contrast
Comparison: none

CLINICAL DATA: Right flank pain.  
 LUMBAR SPINE COMPLETE 

  There is no evidence of fracture. Alignment is normal. The intervertebral disk spaces are within normal limits and no other significant bone abnormalities are identified. 
 IMPRESSION
 Normal study.

[view not recorded (1 of 5)]
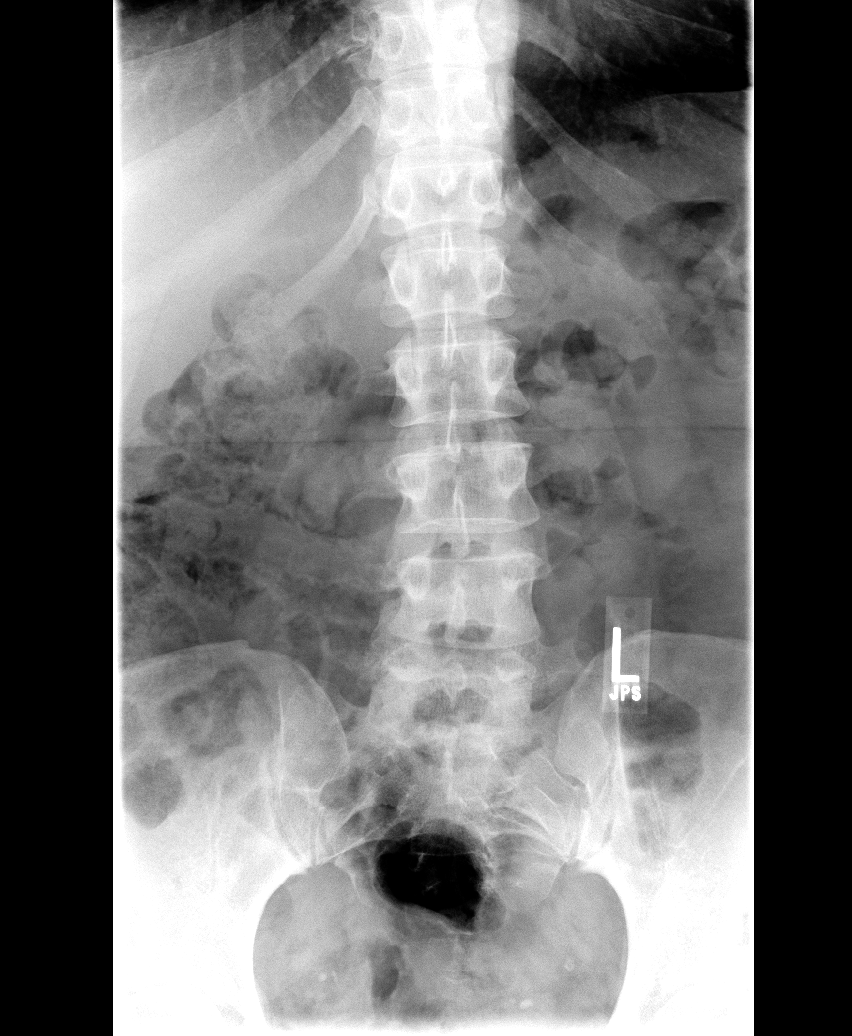

[view not recorded (2 of 5)]
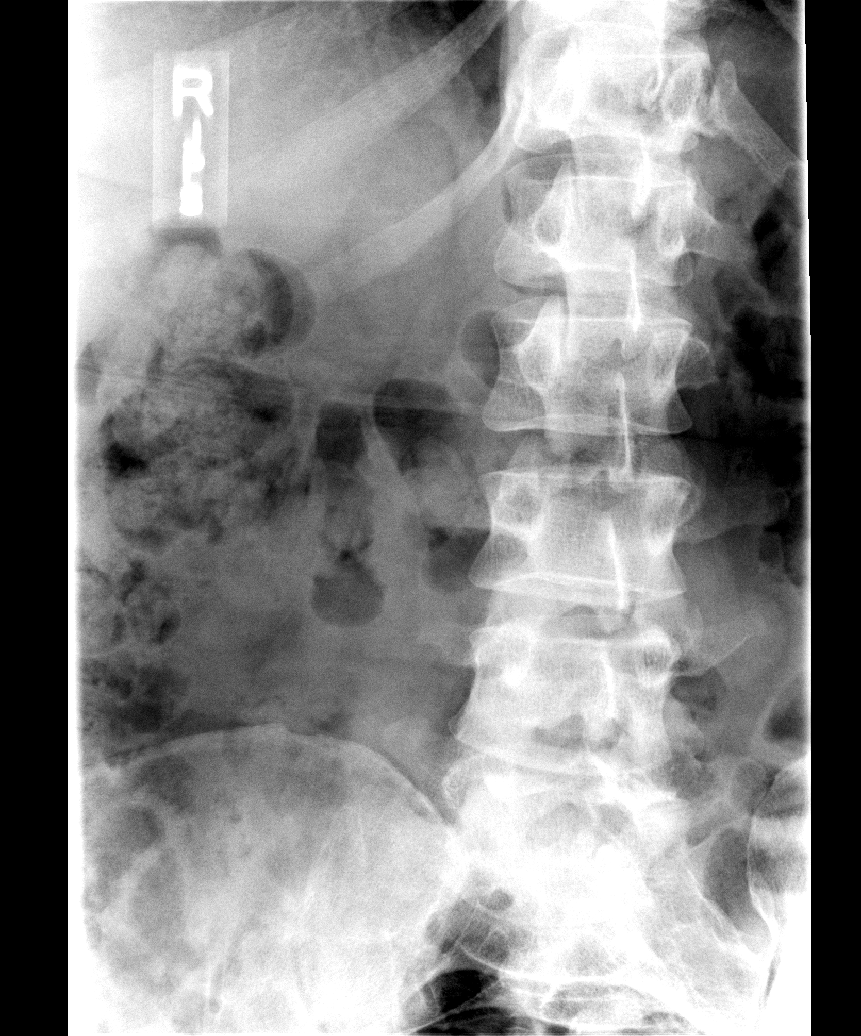

[view not recorded (3 of 5)]
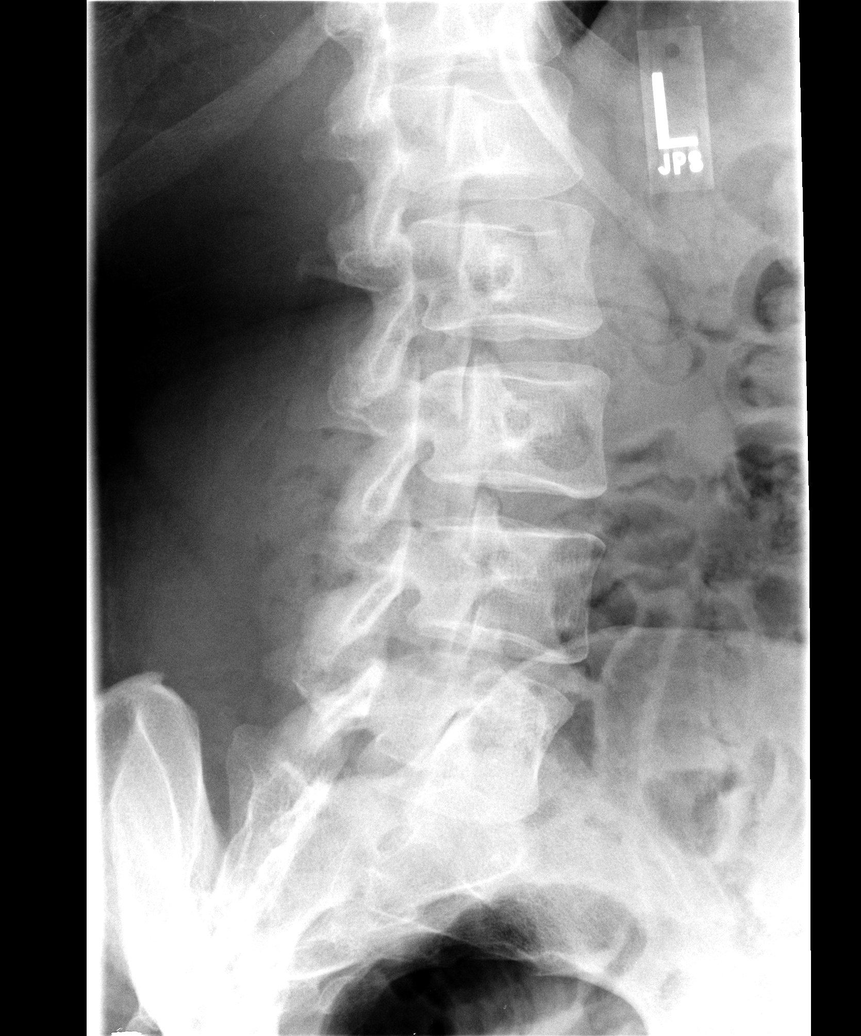

[view not recorded (4 of 5)]
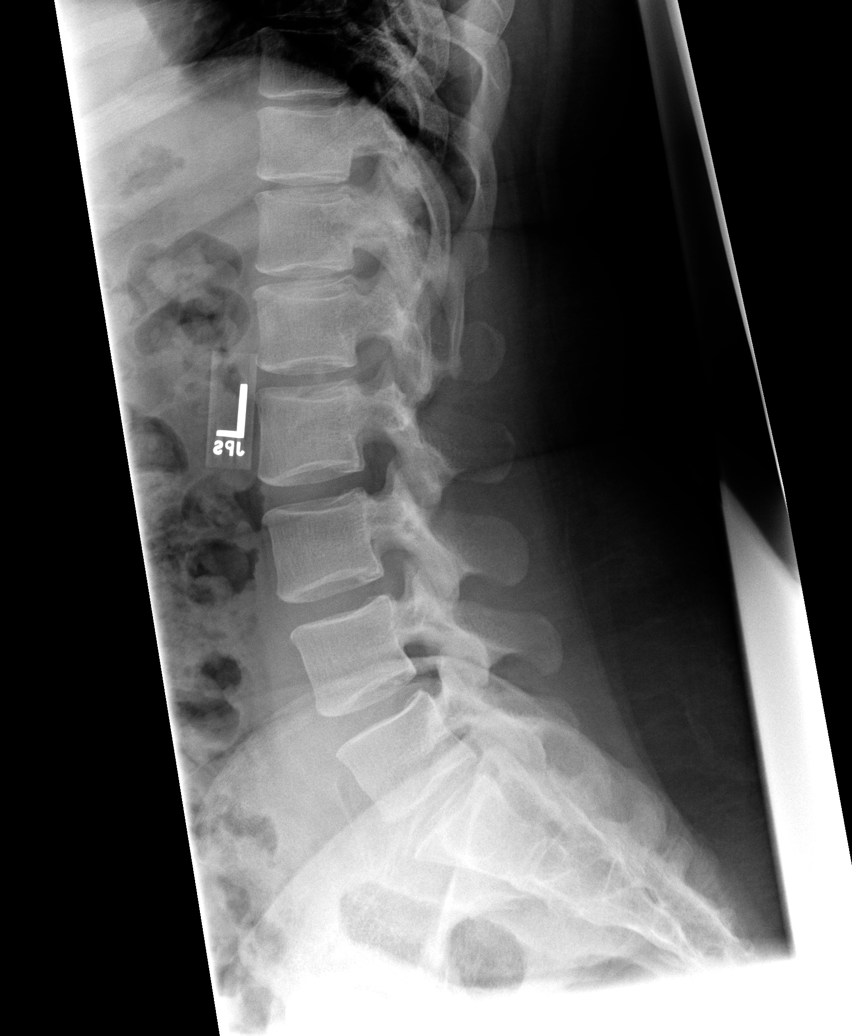

[view not recorded (5 of 5)]
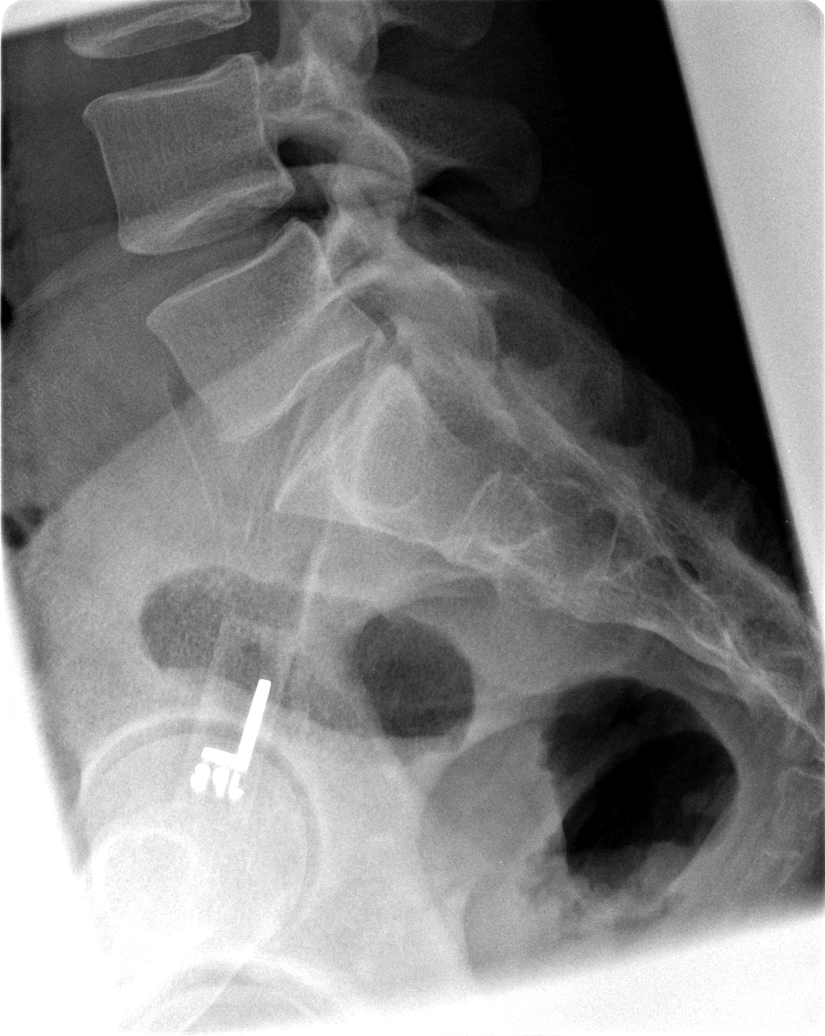

[5 of 5 positions shown; findings below may reference images not displayed]

## 2003-11-03 ENCOUNTER — Emergency Department (HOSPITAL_COMMUNITY): Admission: EM | Admit: 2003-11-03 | Discharge: 2003-11-03 | Payer: Self-pay | Admitting: Emergency Medicine

## 2003-11-03 IMAGING — CT CT PELVIS W/O CM
1 series · 16 of 32 positions shown, 20 images · non-contrast
Comparison: none

CLINICAL DATA: Right flank pain.  Question kidney stone.
CT SCAN OF THE ABDOMEN AND PELVIS UTILIZING RENAL CELL PROTOCOL WITHOUT INTRAVENOUS OR ORAL CONTRAST ? [DATE] 
No comparison CT scans.
CT ABDOMEN:
The lung bases are clear.  The unenhanced visualized portions of the liver, spleen, pancreas, kidneys, and adrenal glands are unremarkable.  No evidence of abdominal aortic aneurysm.  Mild degenerative changes of the lumbar spine.  No evidence of abnormal fluid collection or obvious inflammatory process.  Lack of oral and intravenous contrast limits evaluation of the bowel.  By CT, the gallbladder appears unremarkable.  
IMPRESSION
No evidence of renal calculi or findings to suggest collecting system obstruction.
CT PELVIS:
No evidence of ureteral calculi.  No evidence of abnormal fluid collection or inflammatory process.  No evidence of abnormality along the course of the appendix.  Degenerative changes of the lower lumbar spine.
1.  No evidence of ureteral calculi.
2.  Degenerative changes of lower lumbar spine.

[Series 2: abd/pelvis stone protocol · axial · 0.83mm/px · z∈[-473,-123]mm · 16 of 78 slices shown, 20 images]
[im 5/78  soft-tissue]
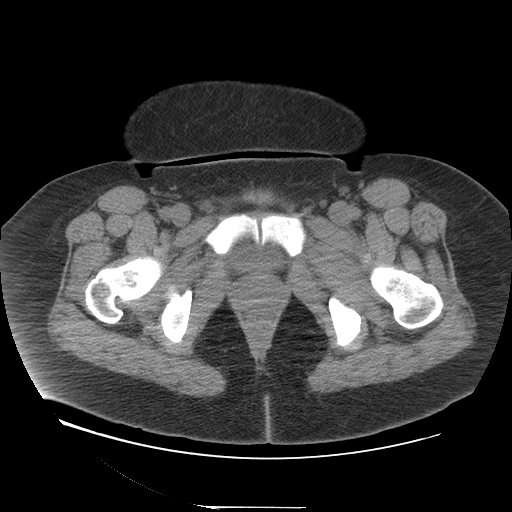
[im 5/78  bone]
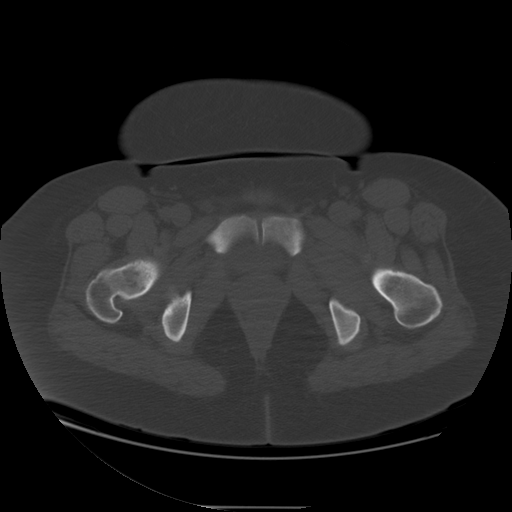
[im 10/78  soft-tissue]
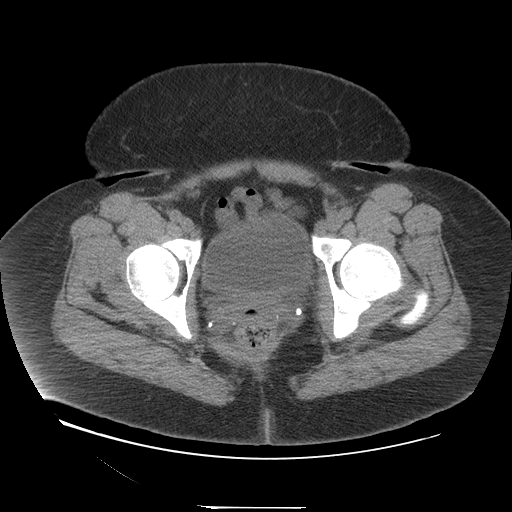
[im 15/78  soft-tissue]
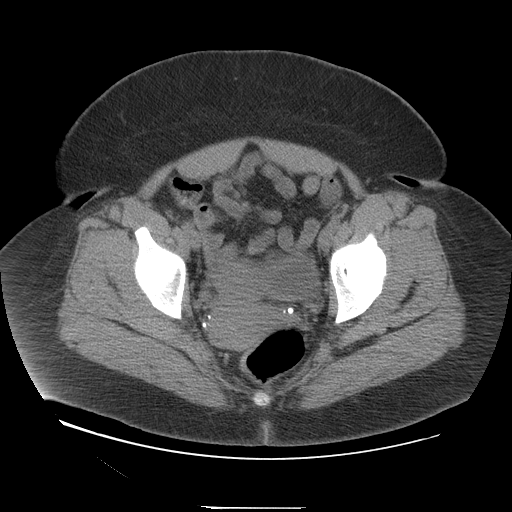
[im 20/78  soft-tissue]
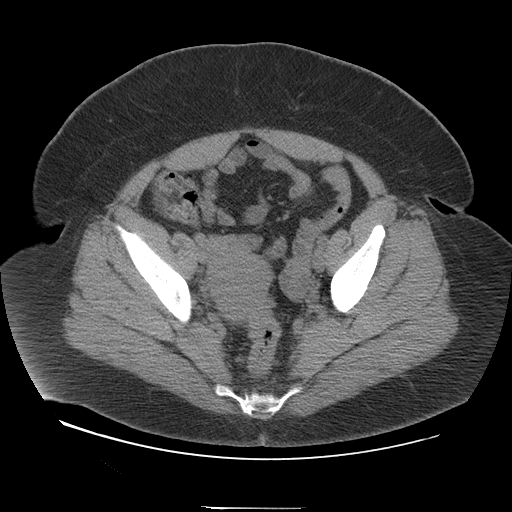
[im 25/78  soft-tissue]
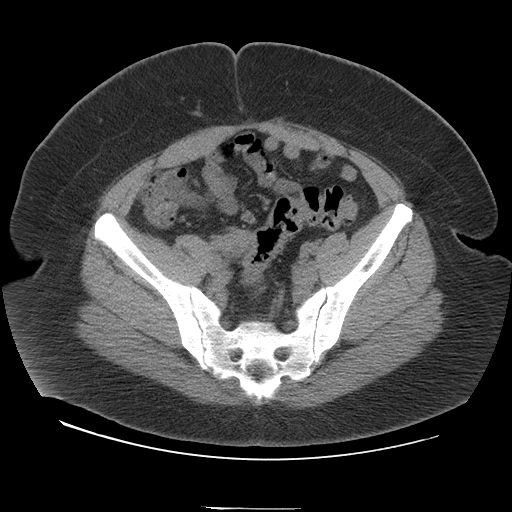
[im 30/78  soft-tissue]
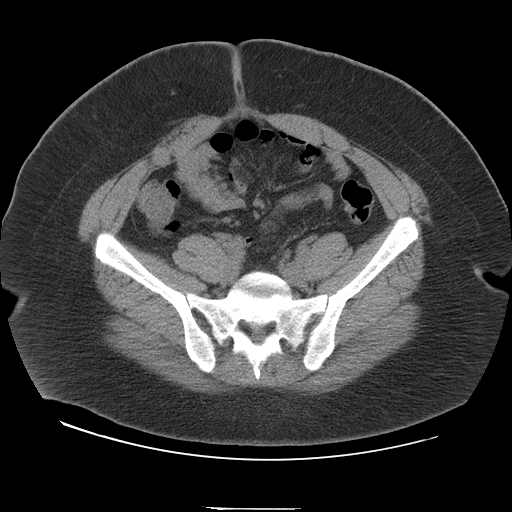
[im 35/78  soft-tissue]
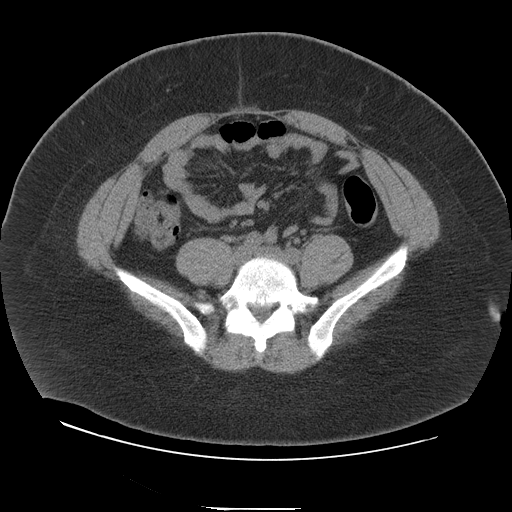
[im 43/78  soft-tissue]
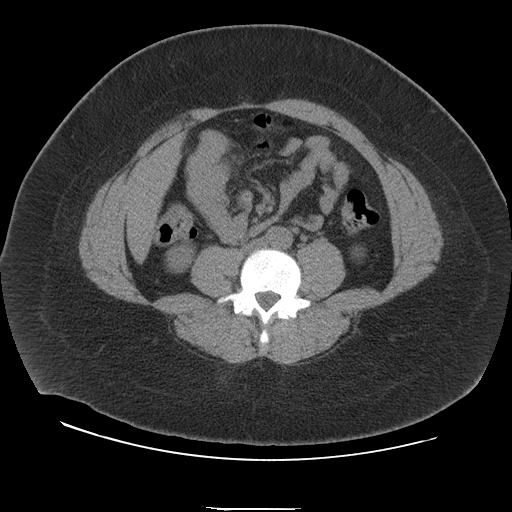
[im 48/78  soft-tissue]
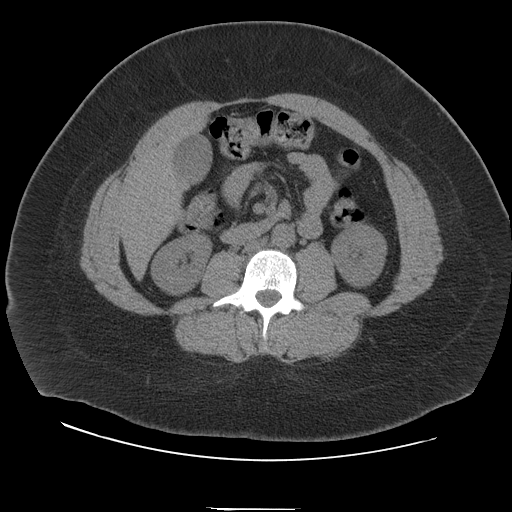
[im 48/78  bone]
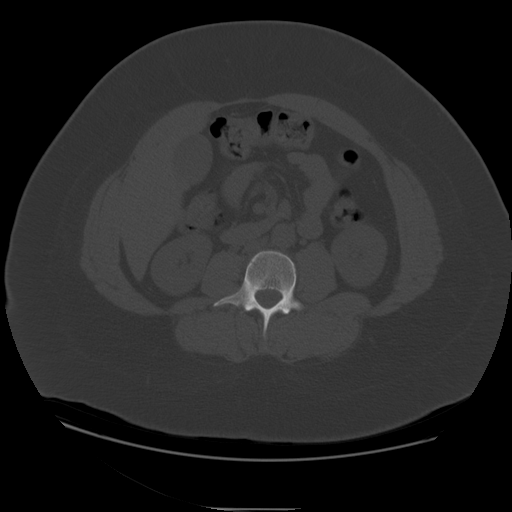
[im 53/78  soft-tissue]
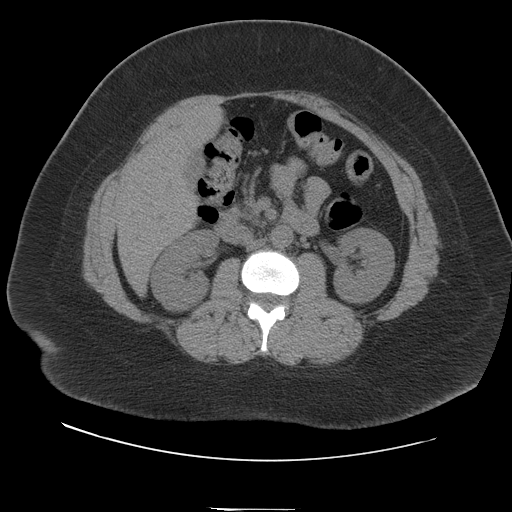
[im 58/78  soft-tissue]
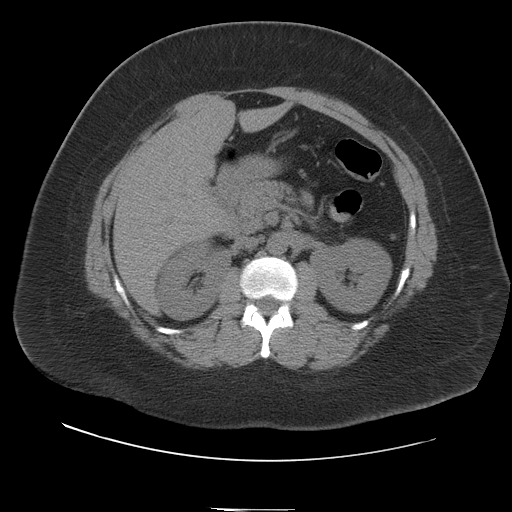
[im 63/78  soft-tissue]
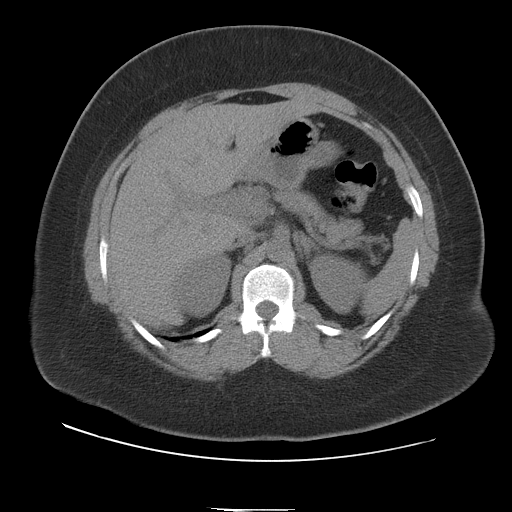
[im 68/78  soft-tissue]
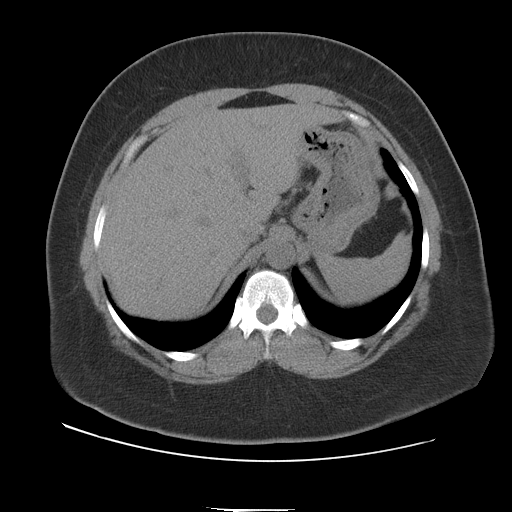
[im 68/78  lung]
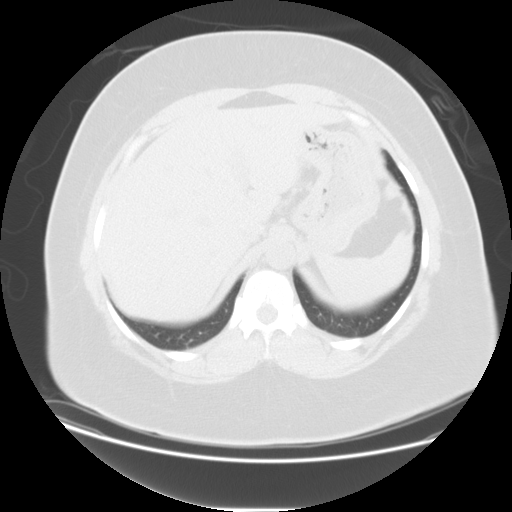
[im 70/78  lung]
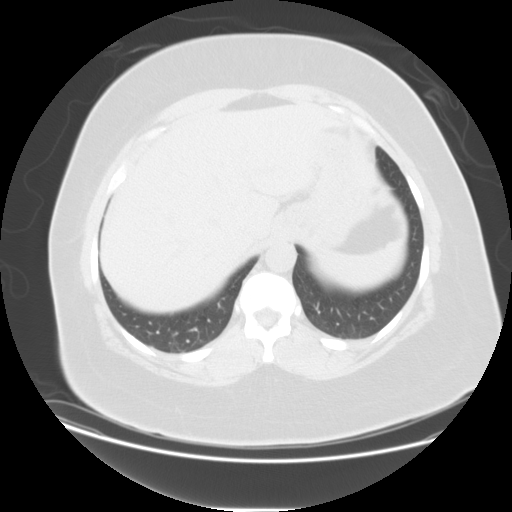
[im 73/78  soft-tissue]
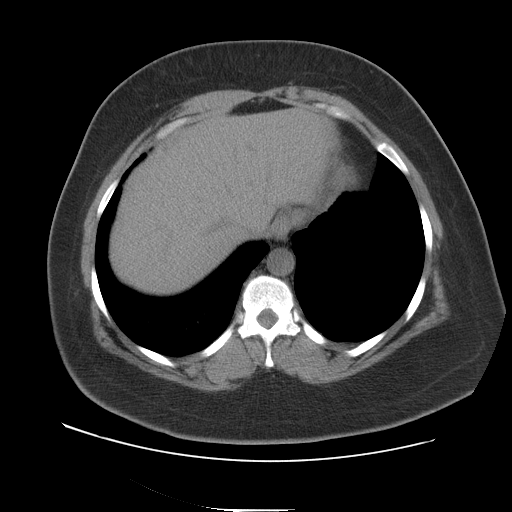
[im 73/78  lung]
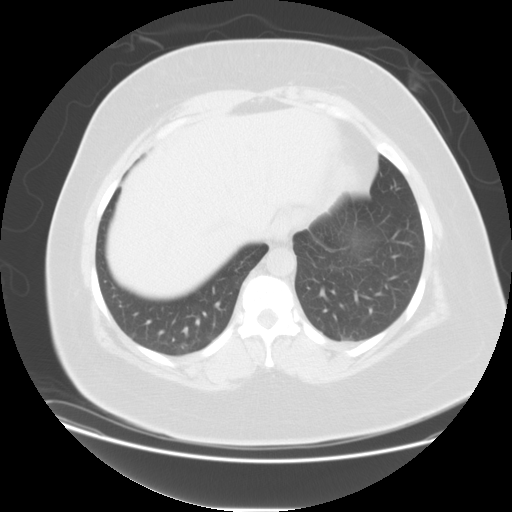
[im 75/78  lung]
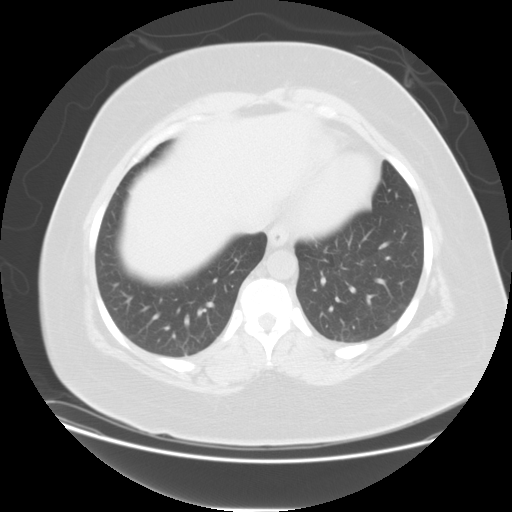

[16 of 32 positions shown; findings below may reference images not displayed]

## 2004-03-20 ENCOUNTER — Ambulatory Visit: Payer: Self-pay | Admitting: Internal Medicine

## 2004-04-18 ENCOUNTER — Emergency Department (HOSPITAL_COMMUNITY): Admission: EM | Admit: 2004-04-18 | Discharge: 2004-04-18 | Payer: Self-pay | Admitting: Emergency Medicine

## 2004-05-27 ENCOUNTER — Emergency Department (HOSPITAL_COMMUNITY): Admission: EM | Admit: 2004-05-27 | Discharge: 2004-05-27 | Payer: Self-pay | Admitting: Emergency Medicine

## 2004-06-13 ENCOUNTER — Emergency Department (HOSPITAL_COMMUNITY): Admission: EM | Admit: 2004-06-13 | Discharge: 2004-06-13 | Payer: Self-pay | Admitting: Emergency Medicine

## 2004-06-13 IMAGING — CT CT HEAD W/O CM
1 series · 16 of 30 positions shown, 20 images · non-contrast
Comparison: None.   
 CT HEAD WITHOUT CONTRAST:

CLINICAL DATA: Headache.
TECHNIQUE: Contiguous axial CT images were obtained from the skull base to the vertex without contrast administration.

[Series 2: brain · axial · 0.47mm/px · z∈[+184,+323]mm · 16 of 30 slices shown, 20 images]
[im 2/30  brain]
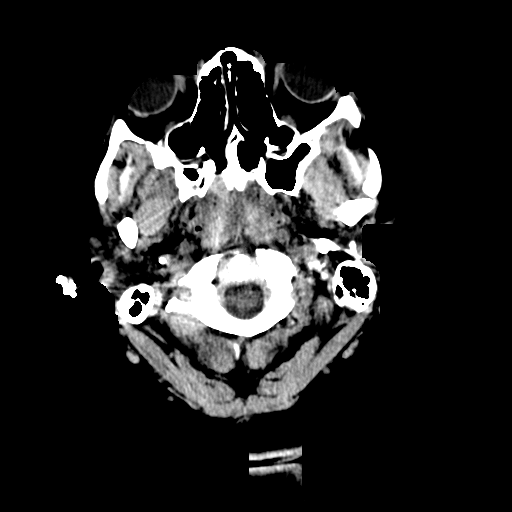
[im 2/30  bone]
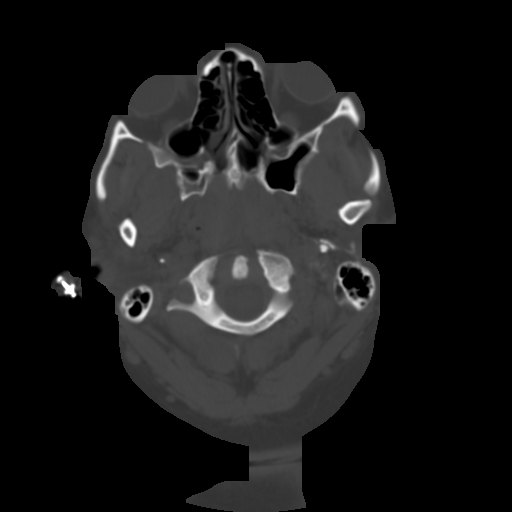
[im 4/30  brain]
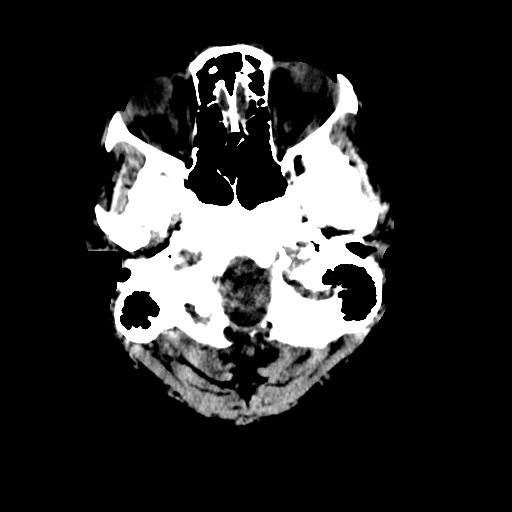
[im 6/30  brain]
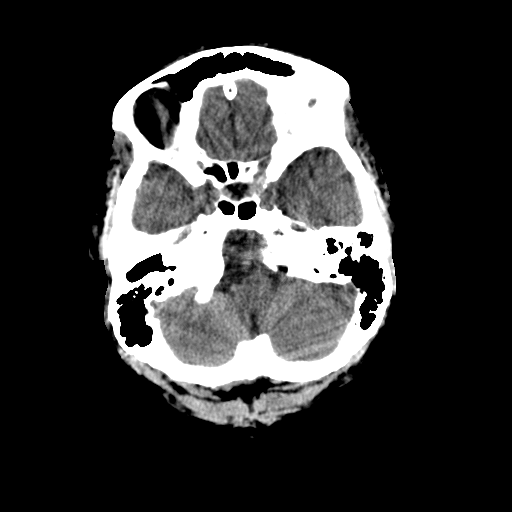
[im 8/30  brain]
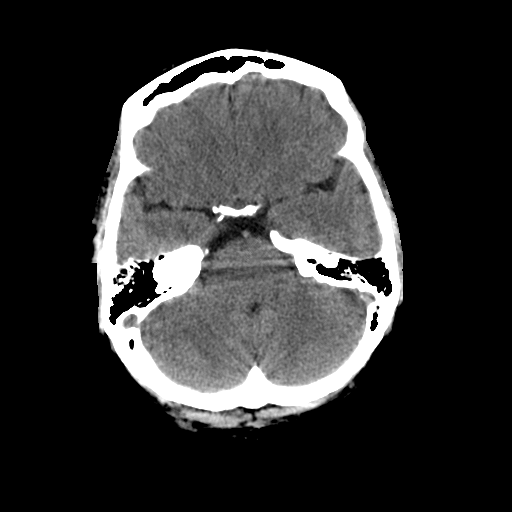
[im 9/30  brain]
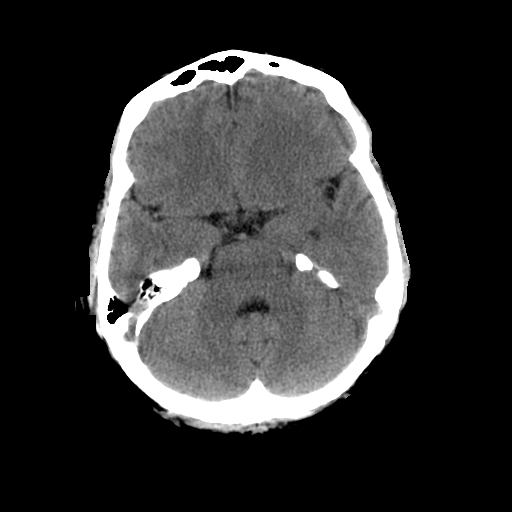
[im 9/30  bone]
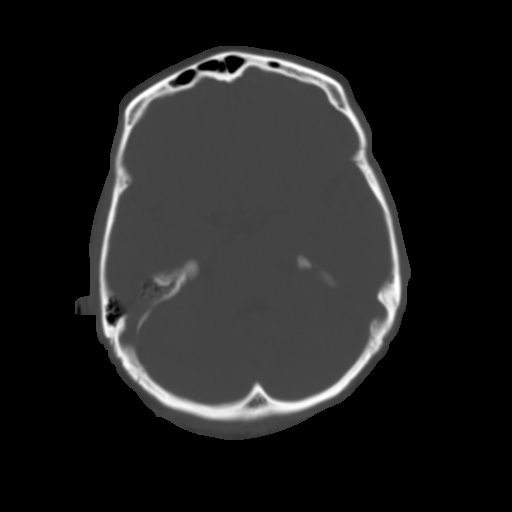
[im 11/30  brain]
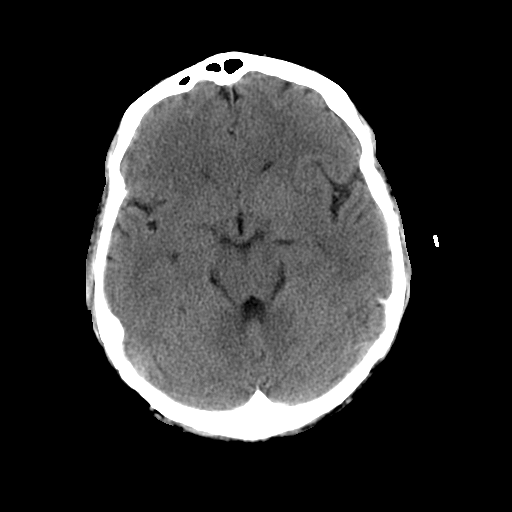
[im 13/30  brain]
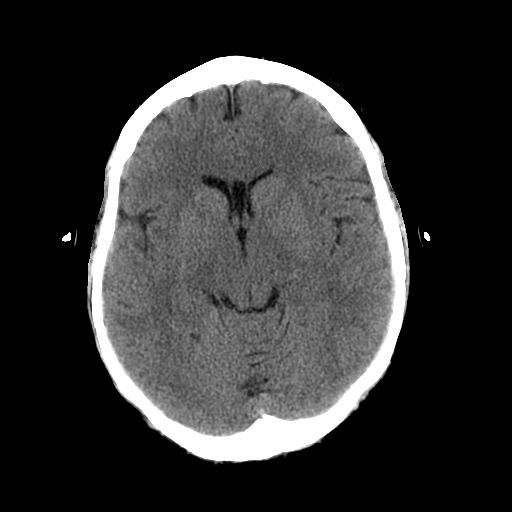
[im 15/30  brain]
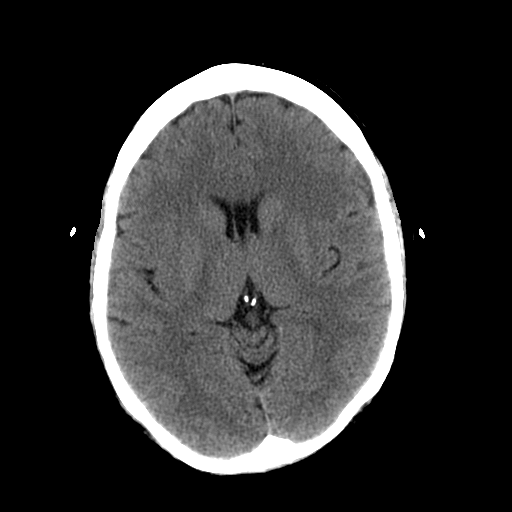
[im 16/30  brain]
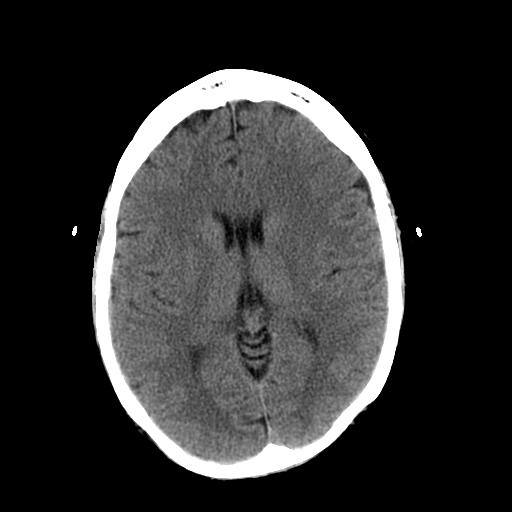
[im 16/30  bone]
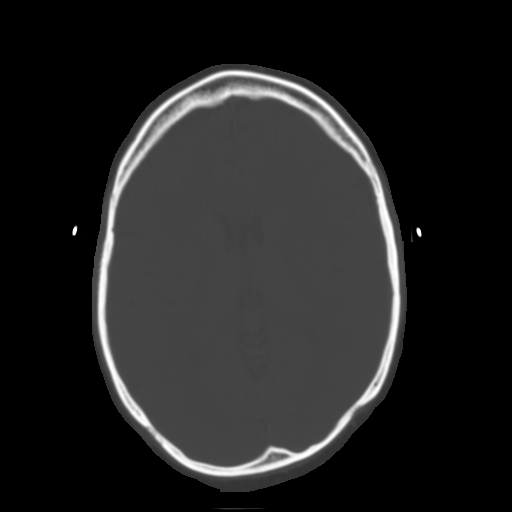
[im 18/30  brain]
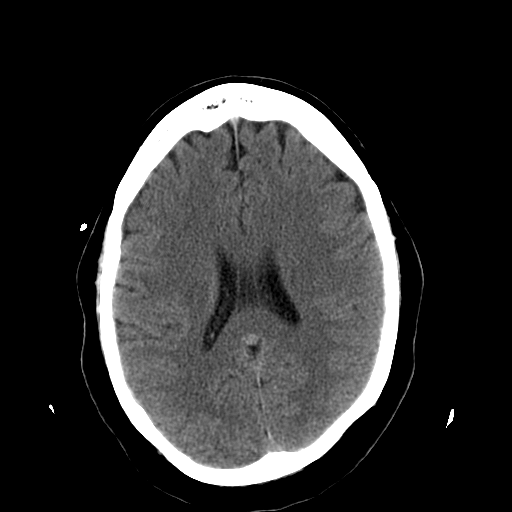
[im 20/30  brain]
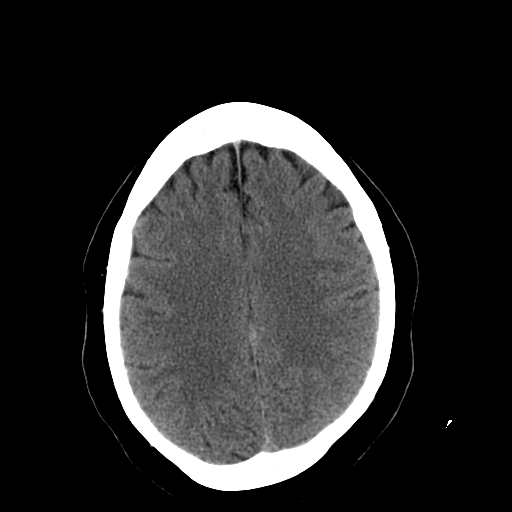
[im 22/30  brain]
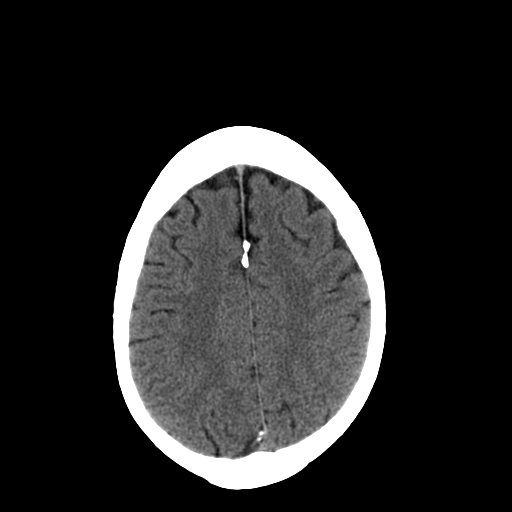
[im 23/30  brain]
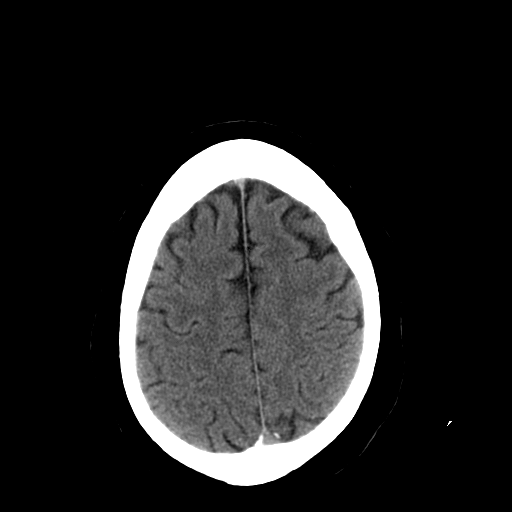
[im 23/30  bone]
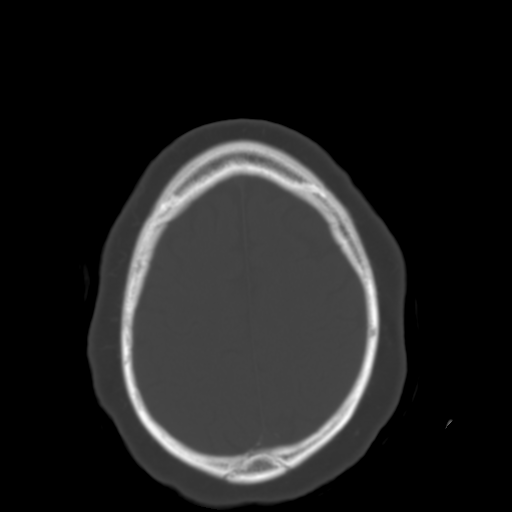
[im 25/30  brain]
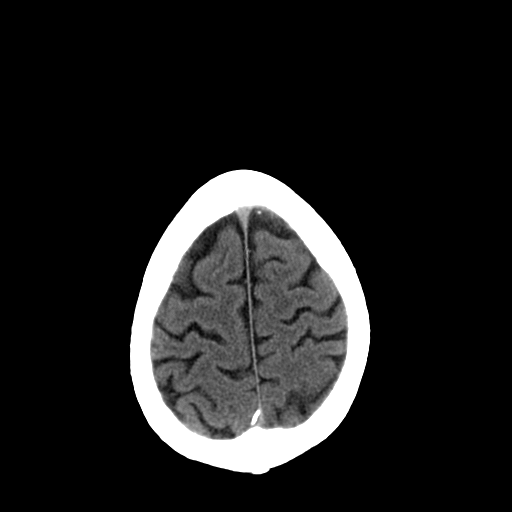
[im 27/30  brain]
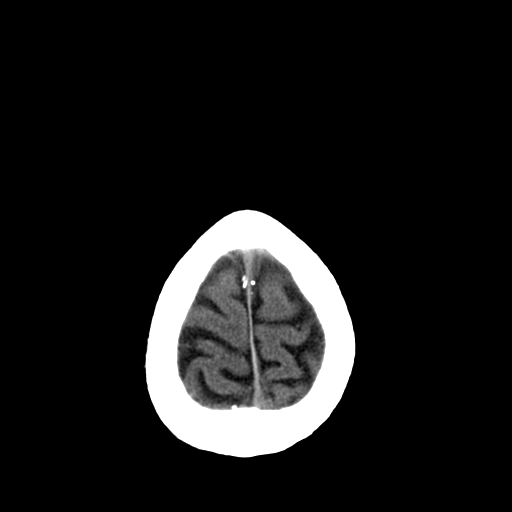
[im 29/30  brain]
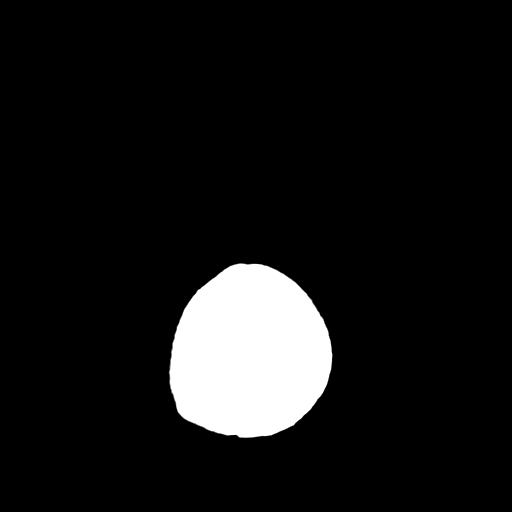

[16 of 30 positions shown; findings below may reference images not displayed]

FINDINGS: No evidence of acute intracranial abnormality including hemorrhage, infarct, mass, mass effect, midline shift, or abnormal extra-axial fluid collection.  No hydrocephalus.  Note is made of a cavum septum pellucidum.  Imaged paranasal sinuses and mastoid air cells are clear.
IMPRESSION: No acute finding. 
 [REDACTED]

## 2004-06-16 ENCOUNTER — Emergency Department (HOSPITAL_COMMUNITY): Admission: EM | Admit: 2004-06-16 | Discharge: 2004-06-16 | Payer: Self-pay | Admitting: Emergency Medicine

## 2004-06-29 ENCOUNTER — Ambulatory Visit: Payer: Self-pay | Admitting: Internal Medicine

## 2004-07-05 ENCOUNTER — Ambulatory Visit: Payer: Self-pay | Admitting: Internal Medicine

## 2004-07-13 ENCOUNTER — Ambulatory Visit: Payer: Self-pay | Admitting: Internal Medicine

## 2004-07-18 ENCOUNTER — Ambulatory Visit: Payer: Self-pay | Admitting: Internal Medicine

## 2004-08-06 ENCOUNTER — Ambulatory Visit: Payer: Self-pay | Admitting: Internal Medicine

## 2005-01-14 ENCOUNTER — Emergency Department (HOSPITAL_COMMUNITY): Admission: EM | Admit: 2005-01-14 | Discharge: 2005-01-14 | Payer: Self-pay | Admitting: Emergency Medicine

## 2005-01-14 IMAGING — CR DG CHEST 2V
3 series · 3 of 3 positions shown · non-contrast
Comparison: [DATE] which was negative.

CLINICAL DATA: Shortness of breath, cough, and fever.
 CHEST - 2 VIEWS:

[w chest pa *]
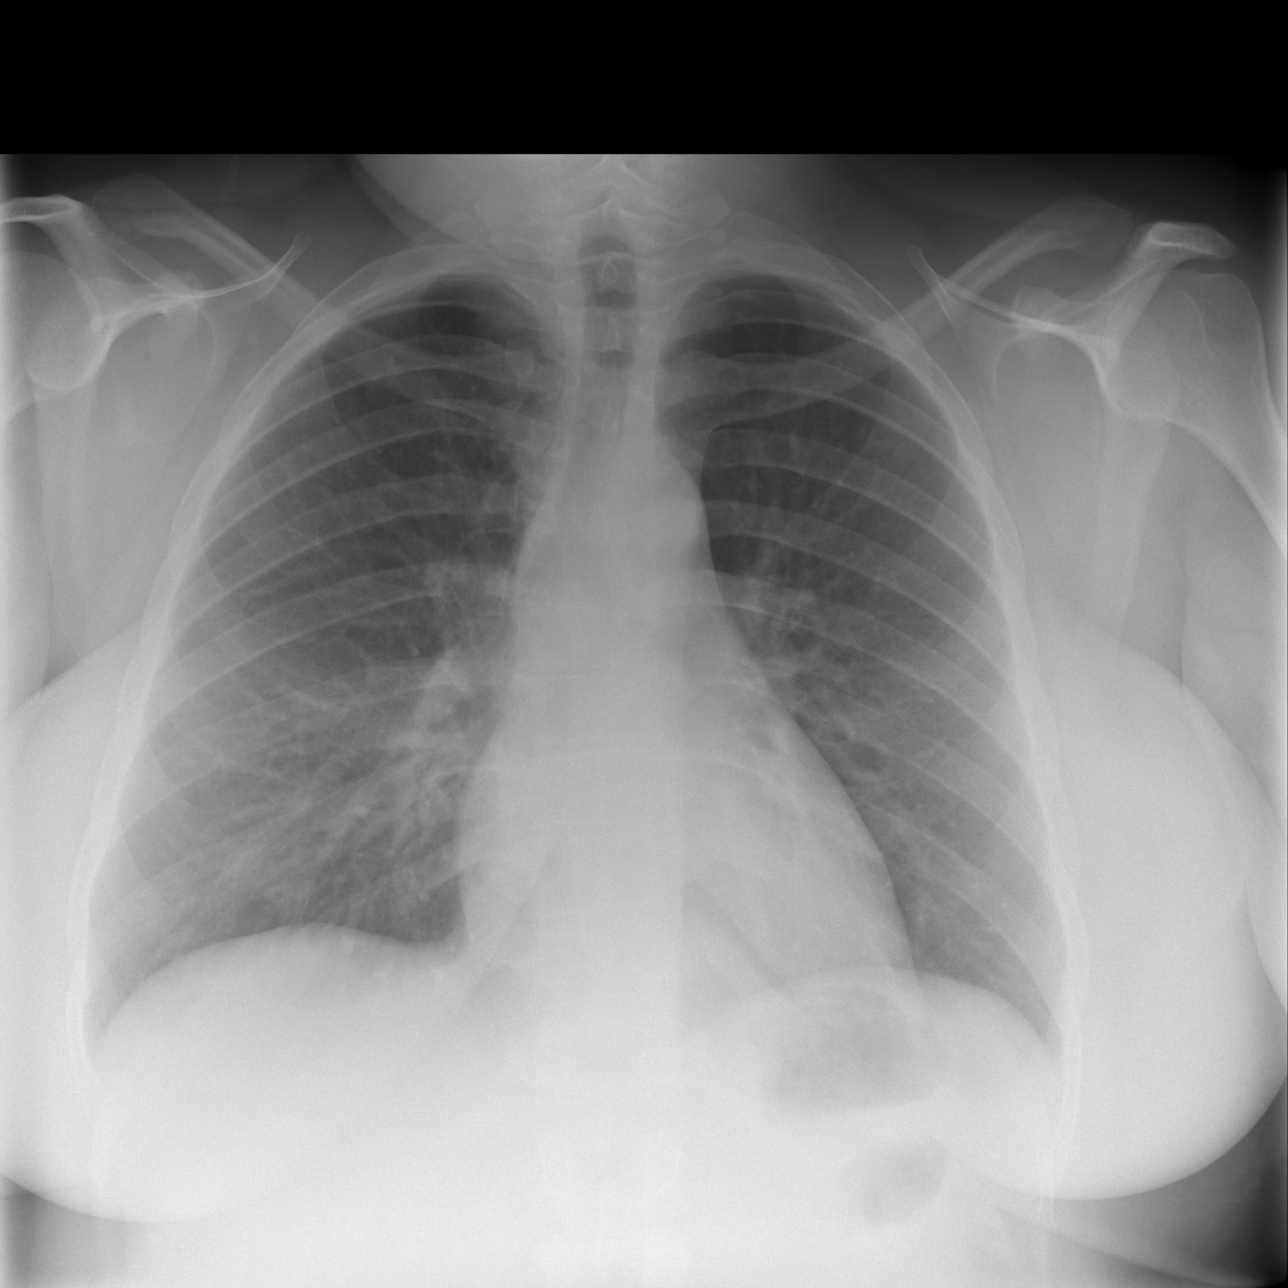

[w chest lat * (1 of 2)]
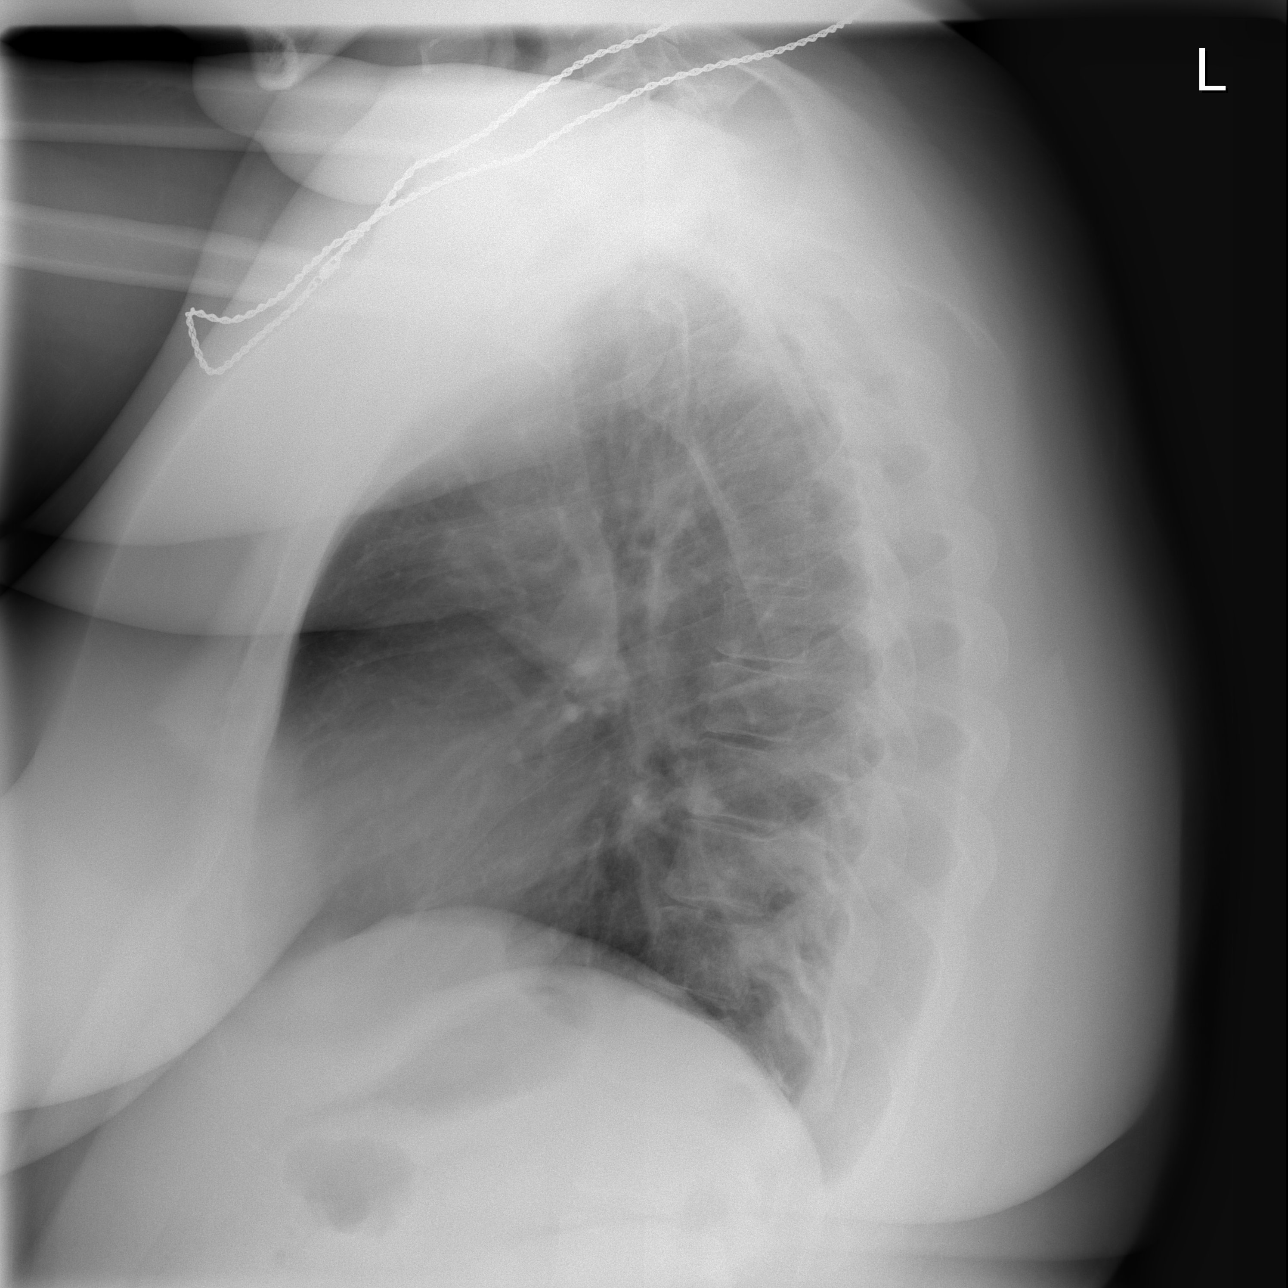

[w chest lat * (2 of 2)]
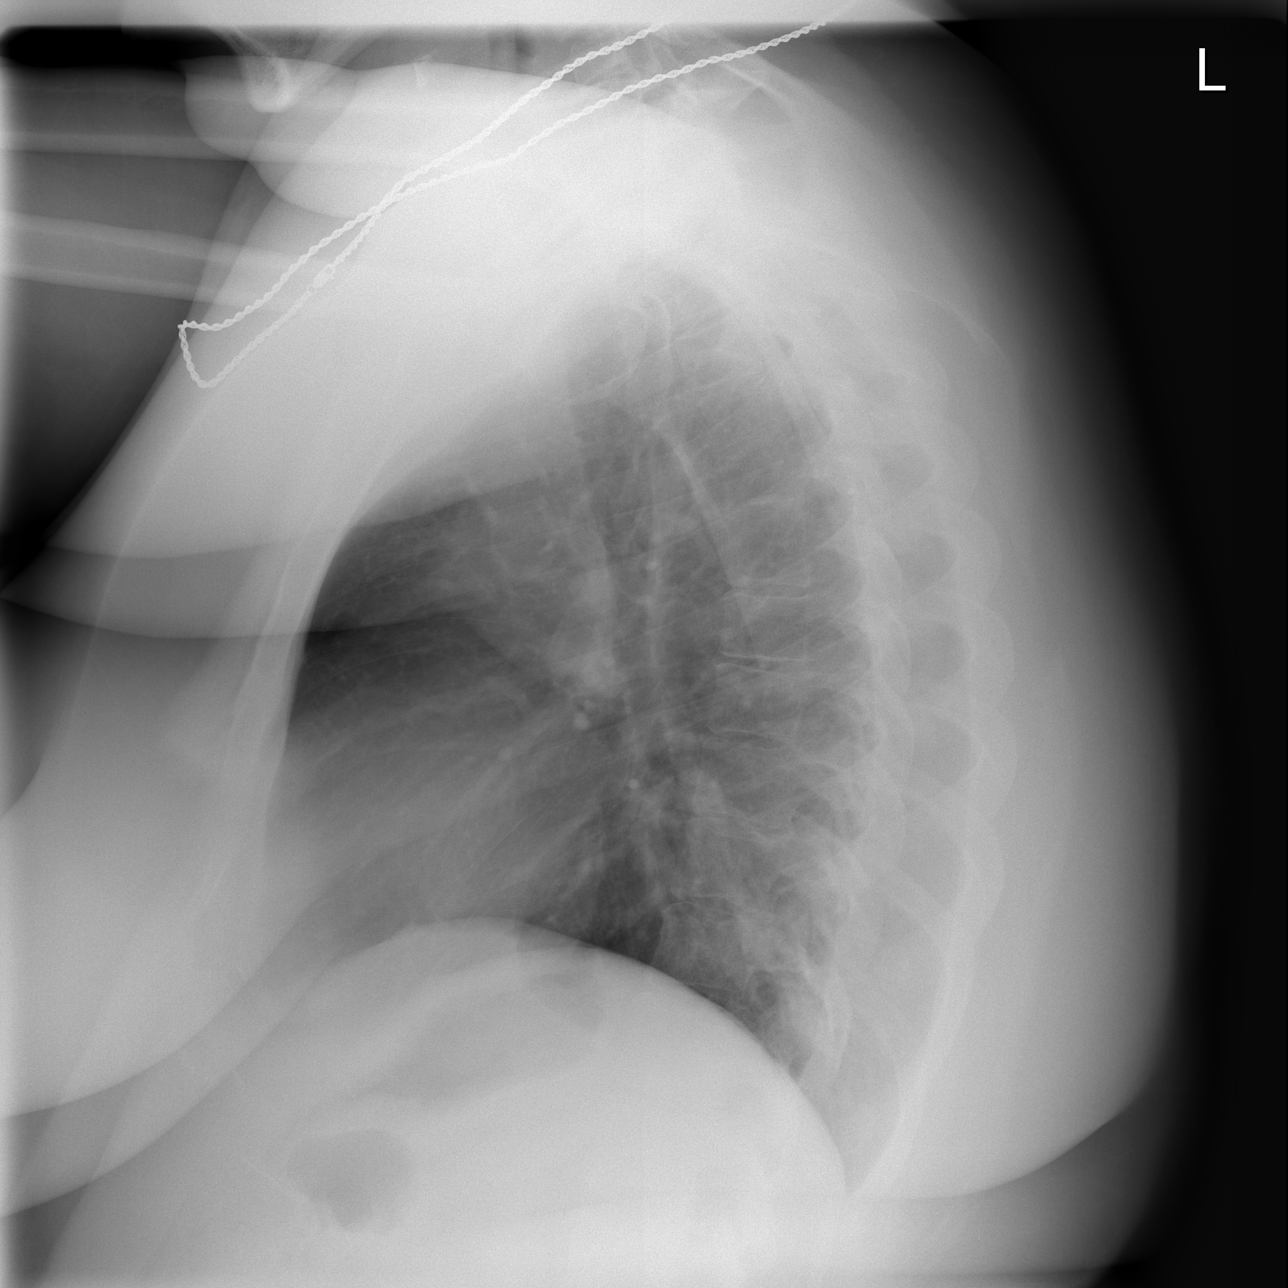

[3 of 3 positions shown; findings below may reference images not displayed]

FINDINGS: PA and lateral views of the chest reveal the heart size to be normal.  The lung fields are well aerated without acute abnormality.
IMPRESSION: No active disease.

## 2005-03-20 ENCOUNTER — Emergency Department (HOSPITAL_COMMUNITY): Admission: EM | Admit: 2005-03-20 | Discharge: 2005-03-20 | Payer: Self-pay | Admitting: *Deleted

## 2005-04-23 ENCOUNTER — Emergency Department (HOSPITAL_COMMUNITY): Admission: EM | Admit: 2005-04-23 | Discharge: 2005-04-24 | Payer: Self-pay | Admitting: *Deleted

## 2005-04-24 IMAGING — CT CT ABDOMEN W/ CM
2 of 5 series · 17 of 46 positions shown, 19 images · IV contrast (omnipaque)
Comparison: [DATE].

CLINICAL DATA: 34-year-old with abdominal pain.  Nausea. 
 ABDOMEN CT WITH CONTRAST:
TECHNIQUE: Multidetector CT imaging of the abdomen was performed following the standard protocol during bolus administration of intravenous contrast.
 Contrast:  100 cc Omnipaque 300.
TECHNIQUE: Multidetector CT imaging of the pelvis was performed following the standard protocol during bolus administration of intravenous contrast.

[Series 2: abd/pelv with 5.0 b31f st · axial · 0.85mm/px · z∈[-528,-138]mm · 14 of 90 slices shown, 16 images]
[im 6/90  soft-tissue]
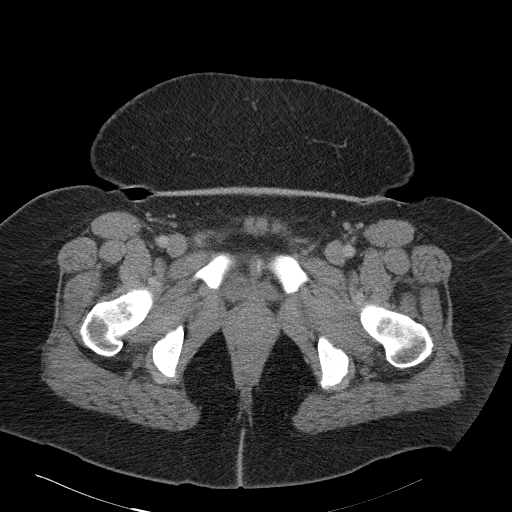
[im 6/90  bone]
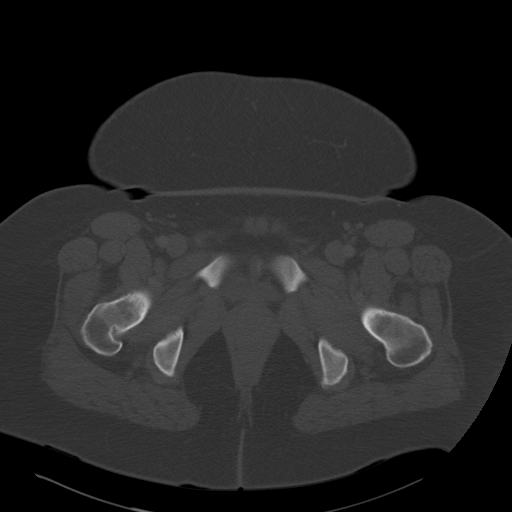
[im 11/90  soft-tissue]
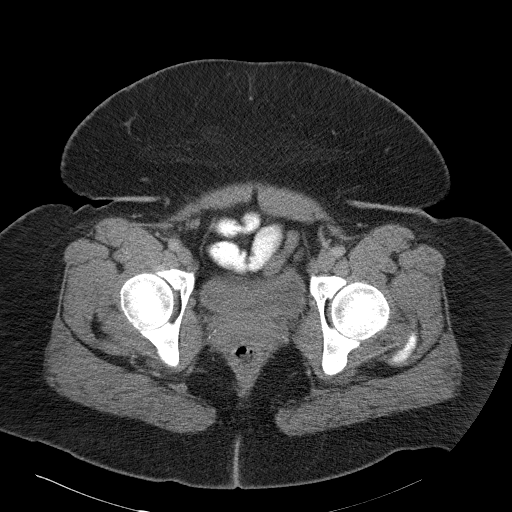
[im 16/90  soft-tissue]
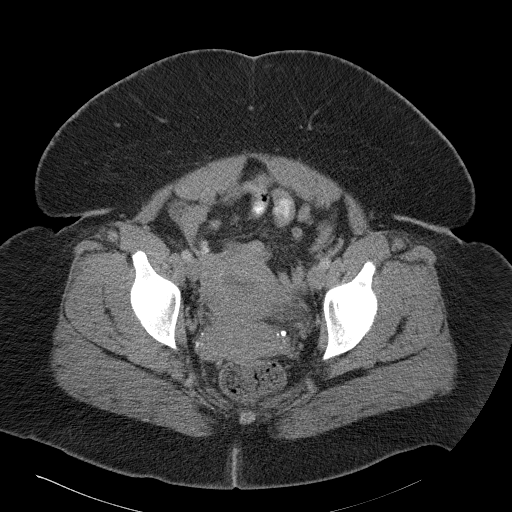
[im 27/90  soft-tissue]
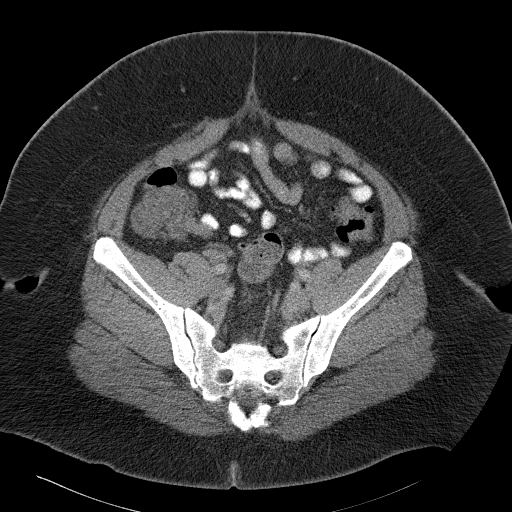
[im 32/90  soft-tissue]
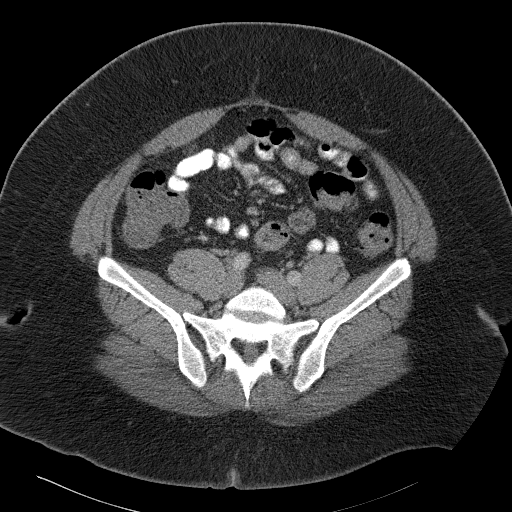
[im 37/90  soft-tissue]
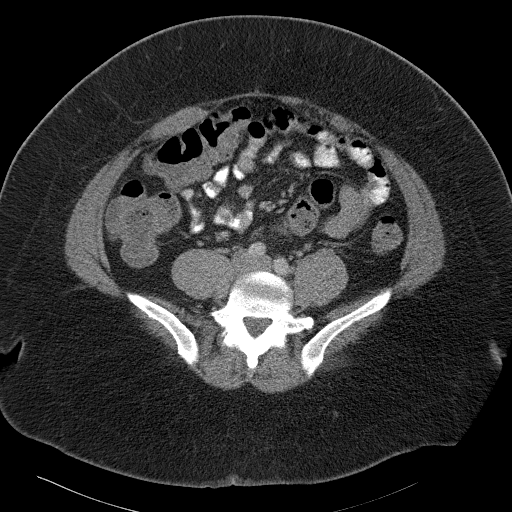
[im 42/90  soft-tissue]
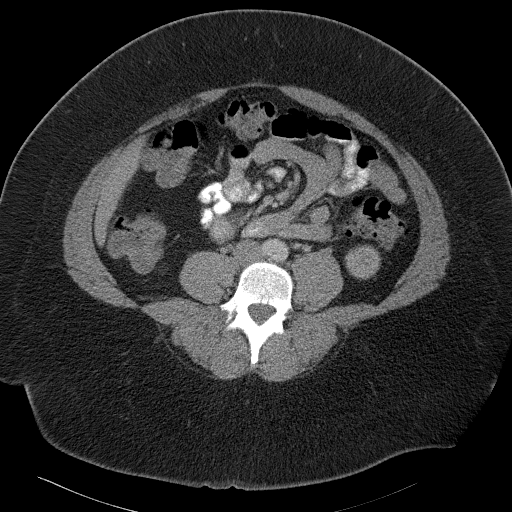
[im 48/90  soft-tissue]
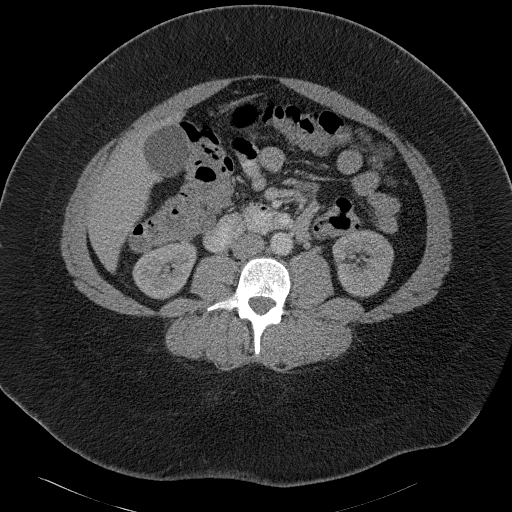
[im 53/90  soft-tissue]
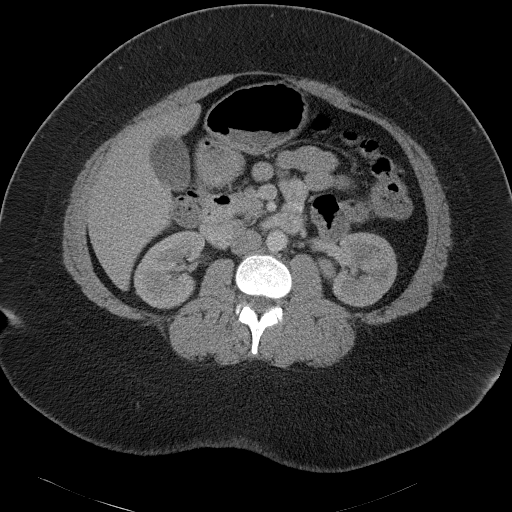
[im 53/90  bone]
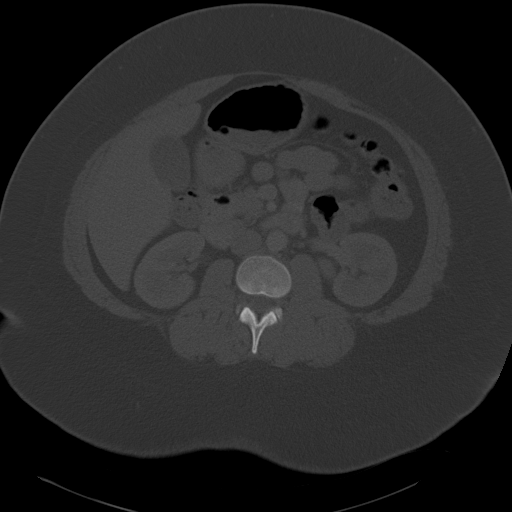
[im 58/90  soft-tissue]
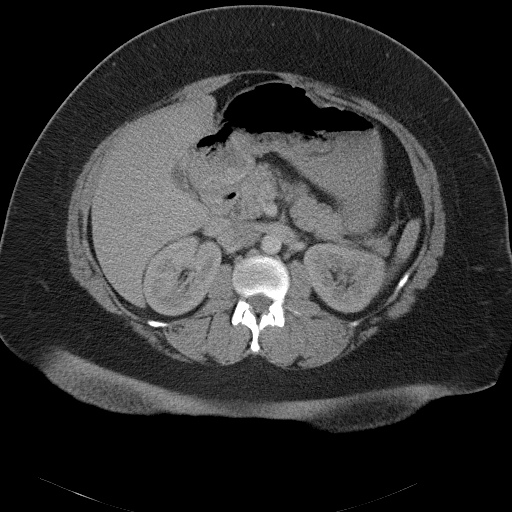
[im 69/90  soft-tissue]
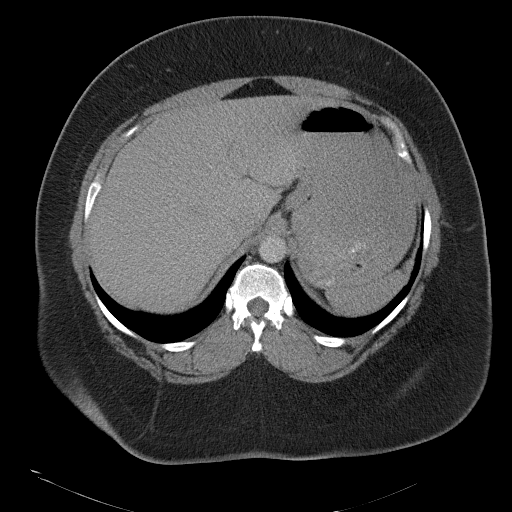
[im 74/90  soft-tissue]
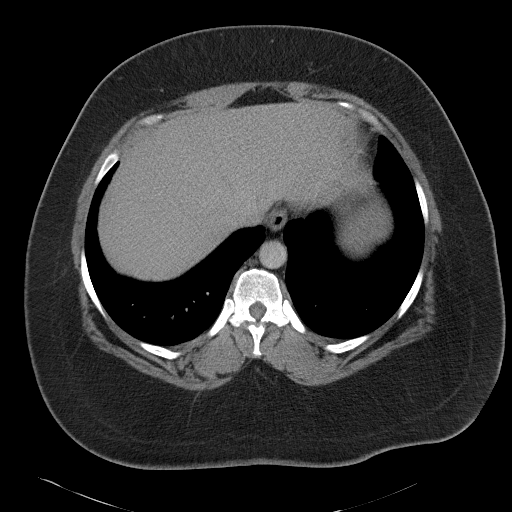
[im 79/90  soft-tissue]
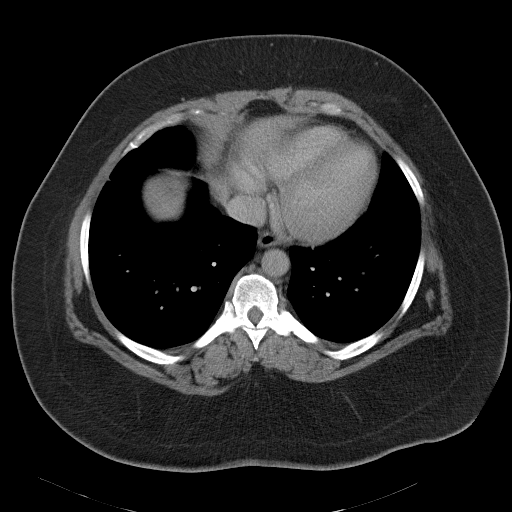
[im 84/90  soft-tissue]
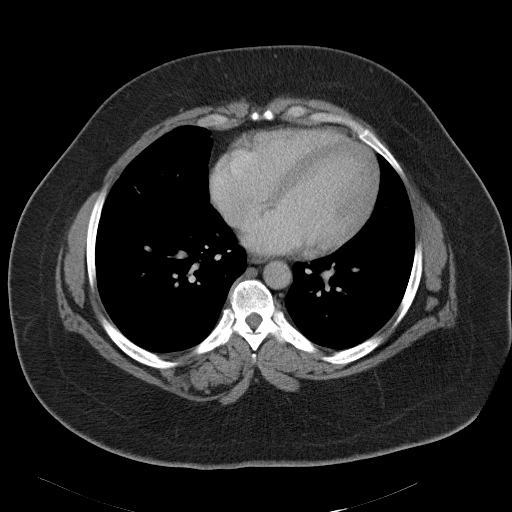

[Series 4: coronal · coronal · 0.88mm/px · 3 of 168 slices shown]
[im 56/168  soft-tissue]
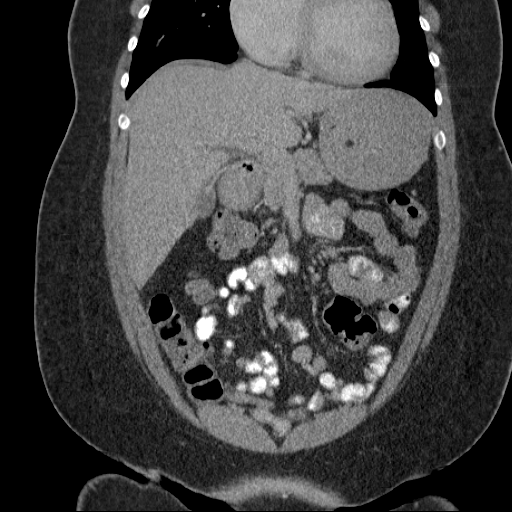
[im 75/168  soft-tissue]
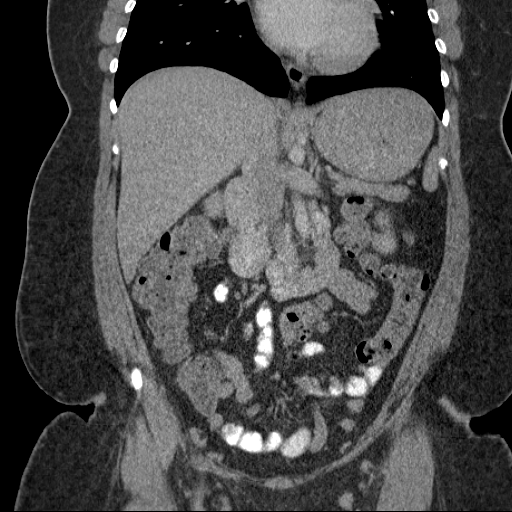
[im 93/168  soft-tissue]
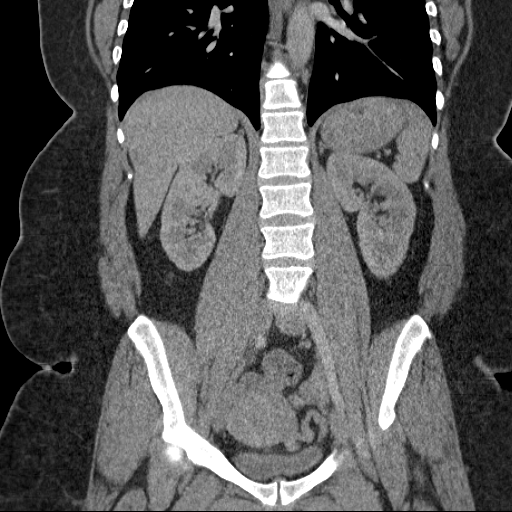

[17 of 46 positions shown; findings below may reference images not displayed]

FINDINGS: The lung bases are clear.  
 The liver, spleen, pancreas, adrenal glands, and kidneys are unremarkable.  I cannot exclude small gallstones but no findings for acute cholecystitis.  The aorta is normal in caliber.  No dissection.  Major branch vessels are normal.  The stomach is distended with fluid and food.  No gross abnormalities.  The duodenum, small bowel and colon are unremarkable except for mild constipation.  No mesenteric or retroperitoneal masses or adenopathy.
IMPRESSION: Unremarkable CT examination of the abdomen.  No acute abdominal findings.
 PELVIS CT WITH CONTRAST:
FINDINGS: The uterus and ovaries are grossly normal.  The rectum, sigmoid colon and visualized small bowel loops appear normal.  Scattered colonic diverticula.  The appendix is visualized and it is normal.  Bladder is unremarkable.  No pelvic masses, adenopathy, or free pelvic fluid collections.  
 No significant bony findings.
IMPRESSION: No acute pelvic findings.

## 2005-04-28 ENCOUNTER — Emergency Department (HOSPITAL_COMMUNITY): Admission: EM | Admit: 2005-04-28 | Discharge: 2005-04-28 | Payer: Self-pay | Admitting: Emergency Medicine

## 2005-06-04 ENCOUNTER — Ambulatory Visit: Payer: Self-pay | Admitting: Internal Medicine

## 2005-06-11 ENCOUNTER — Ambulatory Visit: Payer: Self-pay | Admitting: Internal Medicine

## 2005-09-03 ENCOUNTER — Emergency Department (HOSPITAL_COMMUNITY): Admission: EM | Admit: 2005-09-03 | Discharge: 2005-09-03 | Payer: Self-pay | Admitting: Emergency Medicine

## 2005-09-16 ENCOUNTER — Emergency Department (HOSPITAL_COMMUNITY): Admission: EM | Admit: 2005-09-16 | Discharge: 2005-09-16 | Payer: Self-pay | Admitting: Emergency Medicine

## 2005-11-18 DIAGNOSIS — F329 Major depressive disorder, single episode, unspecified: Secondary | ICD-10-CM

## 2005-11-18 DIAGNOSIS — F411 Generalized anxiety disorder: Secondary | ICD-10-CM | POA: Insufficient documentation

## 2005-11-18 DIAGNOSIS — G90519 Complex regional pain syndrome I of unspecified upper limb: Secondary | ICD-10-CM | POA: Insufficient documentation

## 2005-11-18 DIAGNOSIS — L732 Hidradenitis suppurativa: Secondary | ICD-10-CM

## 2005-11-18 DIAGNOSIS — F410 Panic disorder [episodic paroxysmal anxiety] without agoraphobia: Secondary | ICD-10-CM

## 2006-01-27 ENCOUNTER — Emergency Department (HOSPITAL_COMMUNITY): Admission: EM | Admit: 2006-01-27 | Discharge: 2006-01-27 | Payer: Self-pay | Admitting: Emergency Medicine

## 2006-01-27 IMAGING — CR DG CHEST 2V
2 series · 2 of 2 positions shown · non-contrast
Comparison: [DATE]

CLINICAL DATA: Cough

CHEST - 2 VIEW:

[w chest pa]
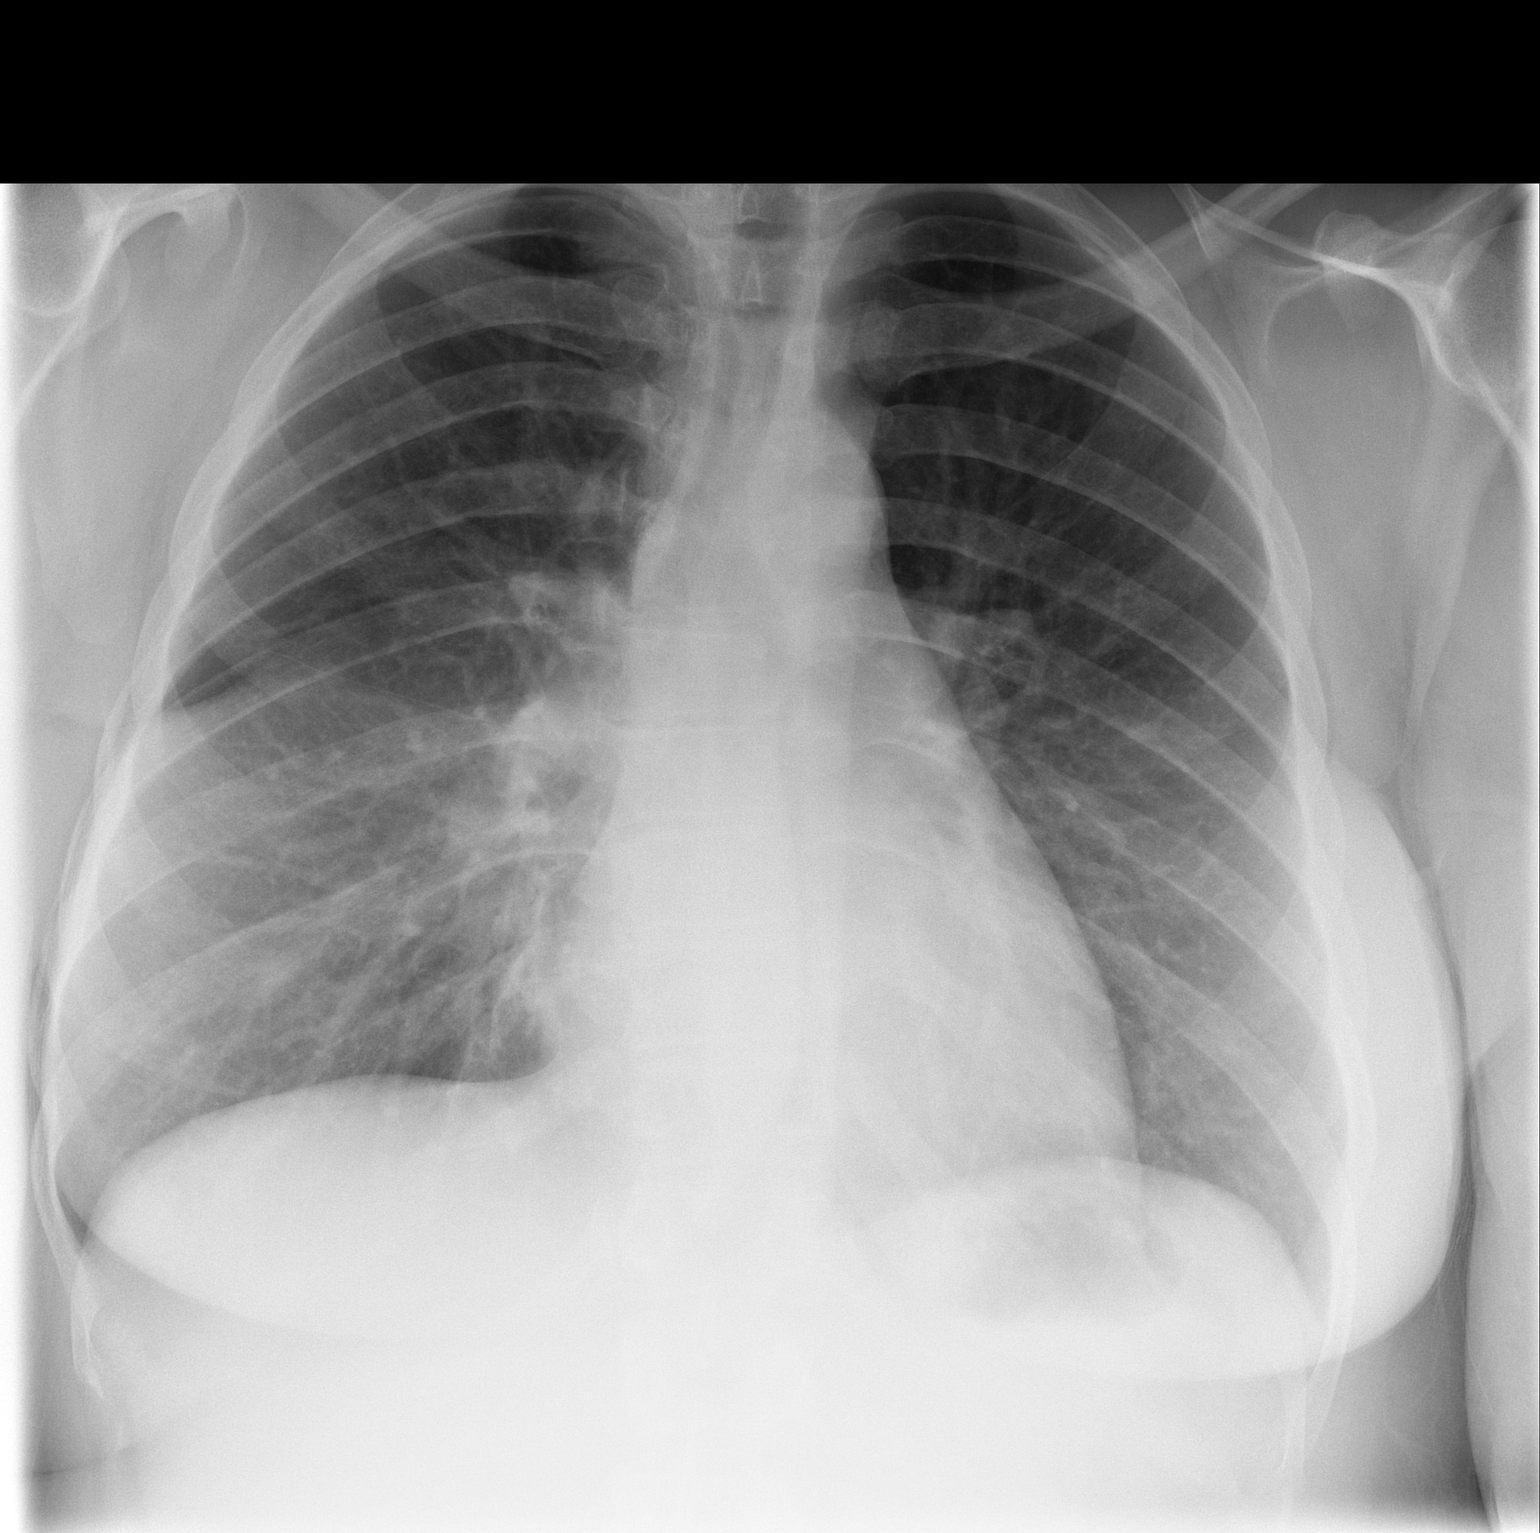

[w chest lat]
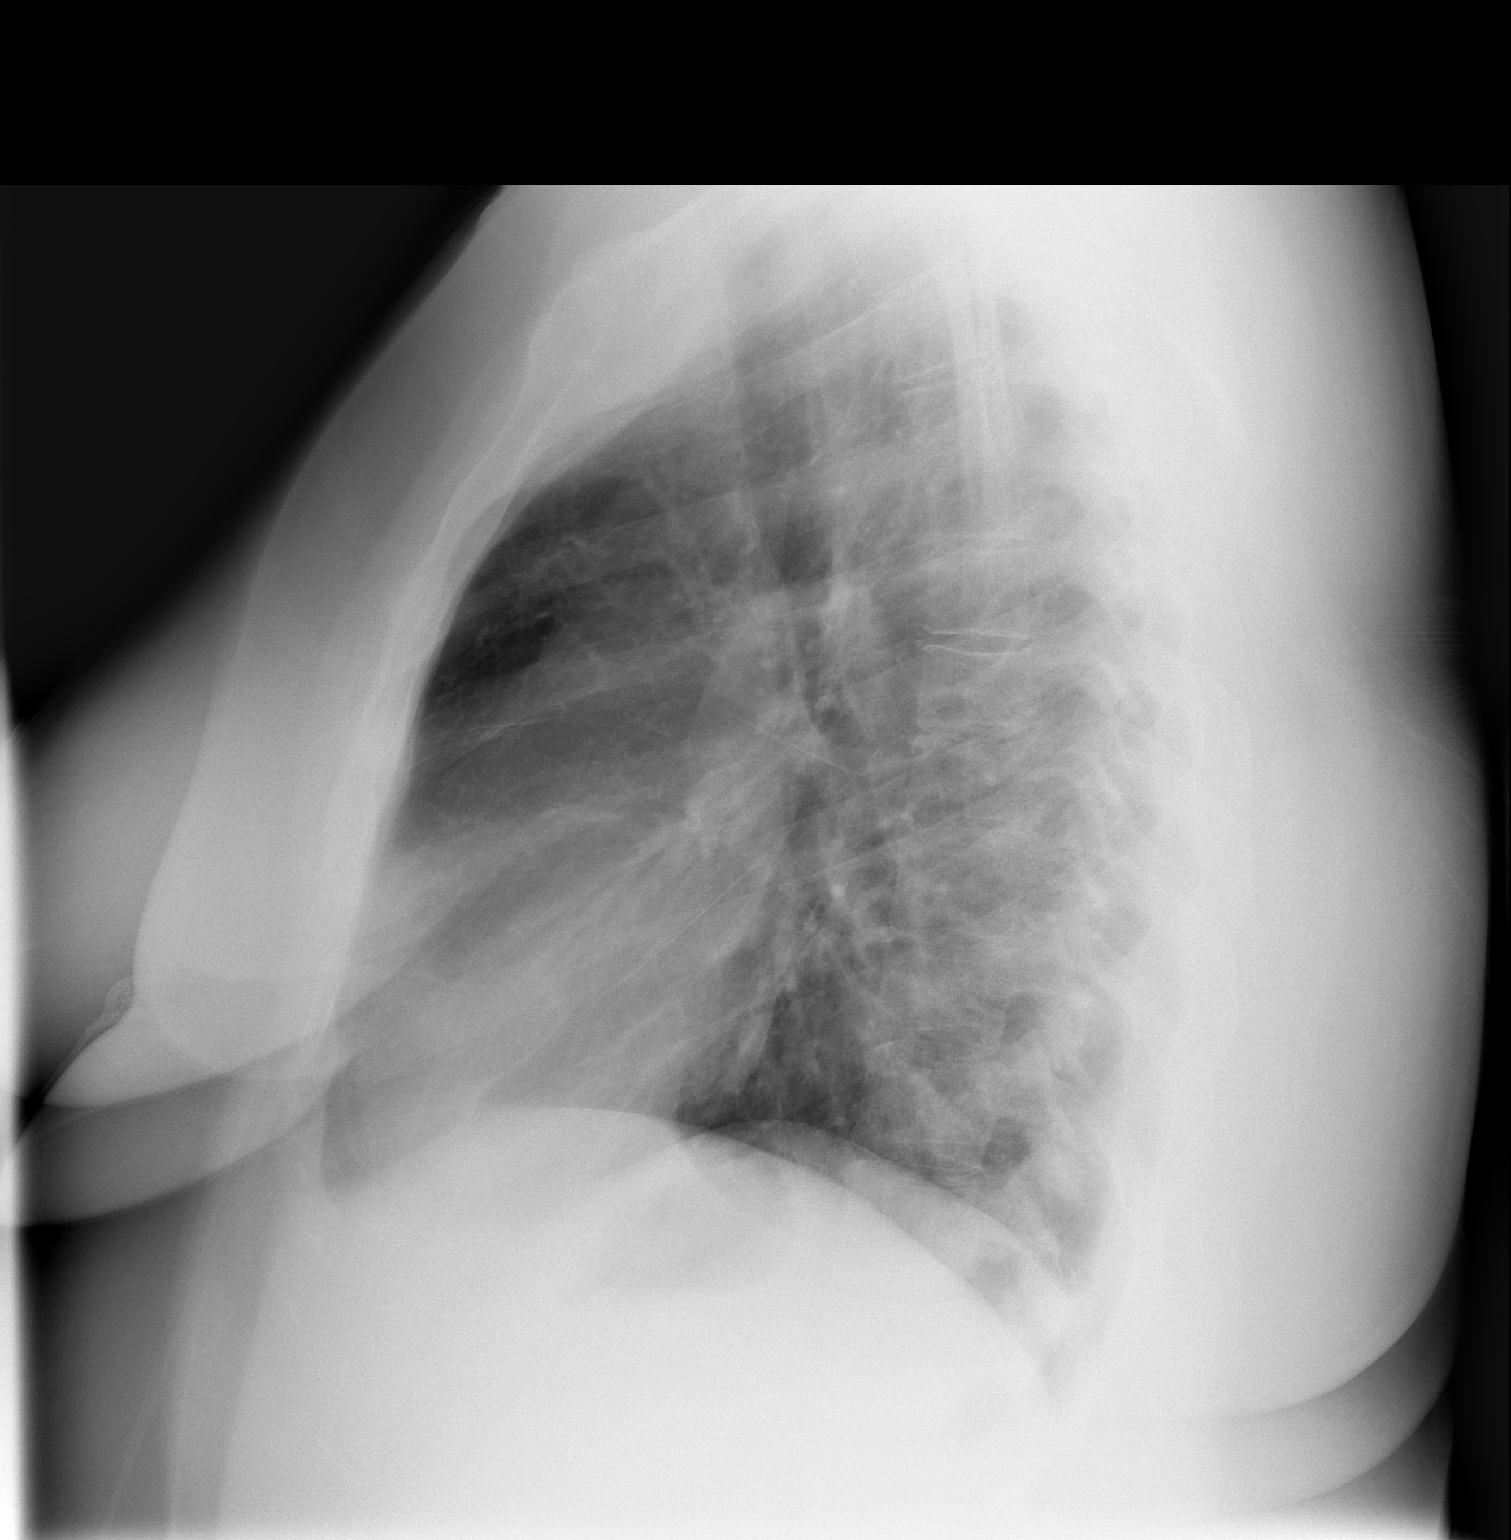

[2 of 2 positions shown; findings below may reference images not displayed]

FINDINGS: The heart size and mediastinal contours are within normal limits. 
Both lungs are clear.  The visualized skeletal structures are unremarkable.
IMPRESSION: No active cardiopulmonary disease

## 2006-02-06 DIAGNOSIS — I10 Essential (primary) hypertension: Secondary | ICD-10-CM | POA: Insufficient documentation

## 2006-02-06 DIAGNOSIS — D509 Iron deficiency anemia, unspecified: Secondary | ICD-10-CM

## 2006-04-04 ENCOUNTER — Emergency Department (HOSPITAL_COMMUNITY): Admission: EM | Admit: 2006-04-04 | Discharge: 2006-04-04 | Payer: Self-pay | Admitting: Emergency Medicine

## 2006-04-04 IMAGING — CR DG CHEST 2V
2 series · 2 of 2 positions shown · non-contrast
Comparison: [DATE].

CLINICAL DATA: Chronic cough, bronchitis, new onset fever.
 CHEST - 2 VIEW:

[w chest pa]
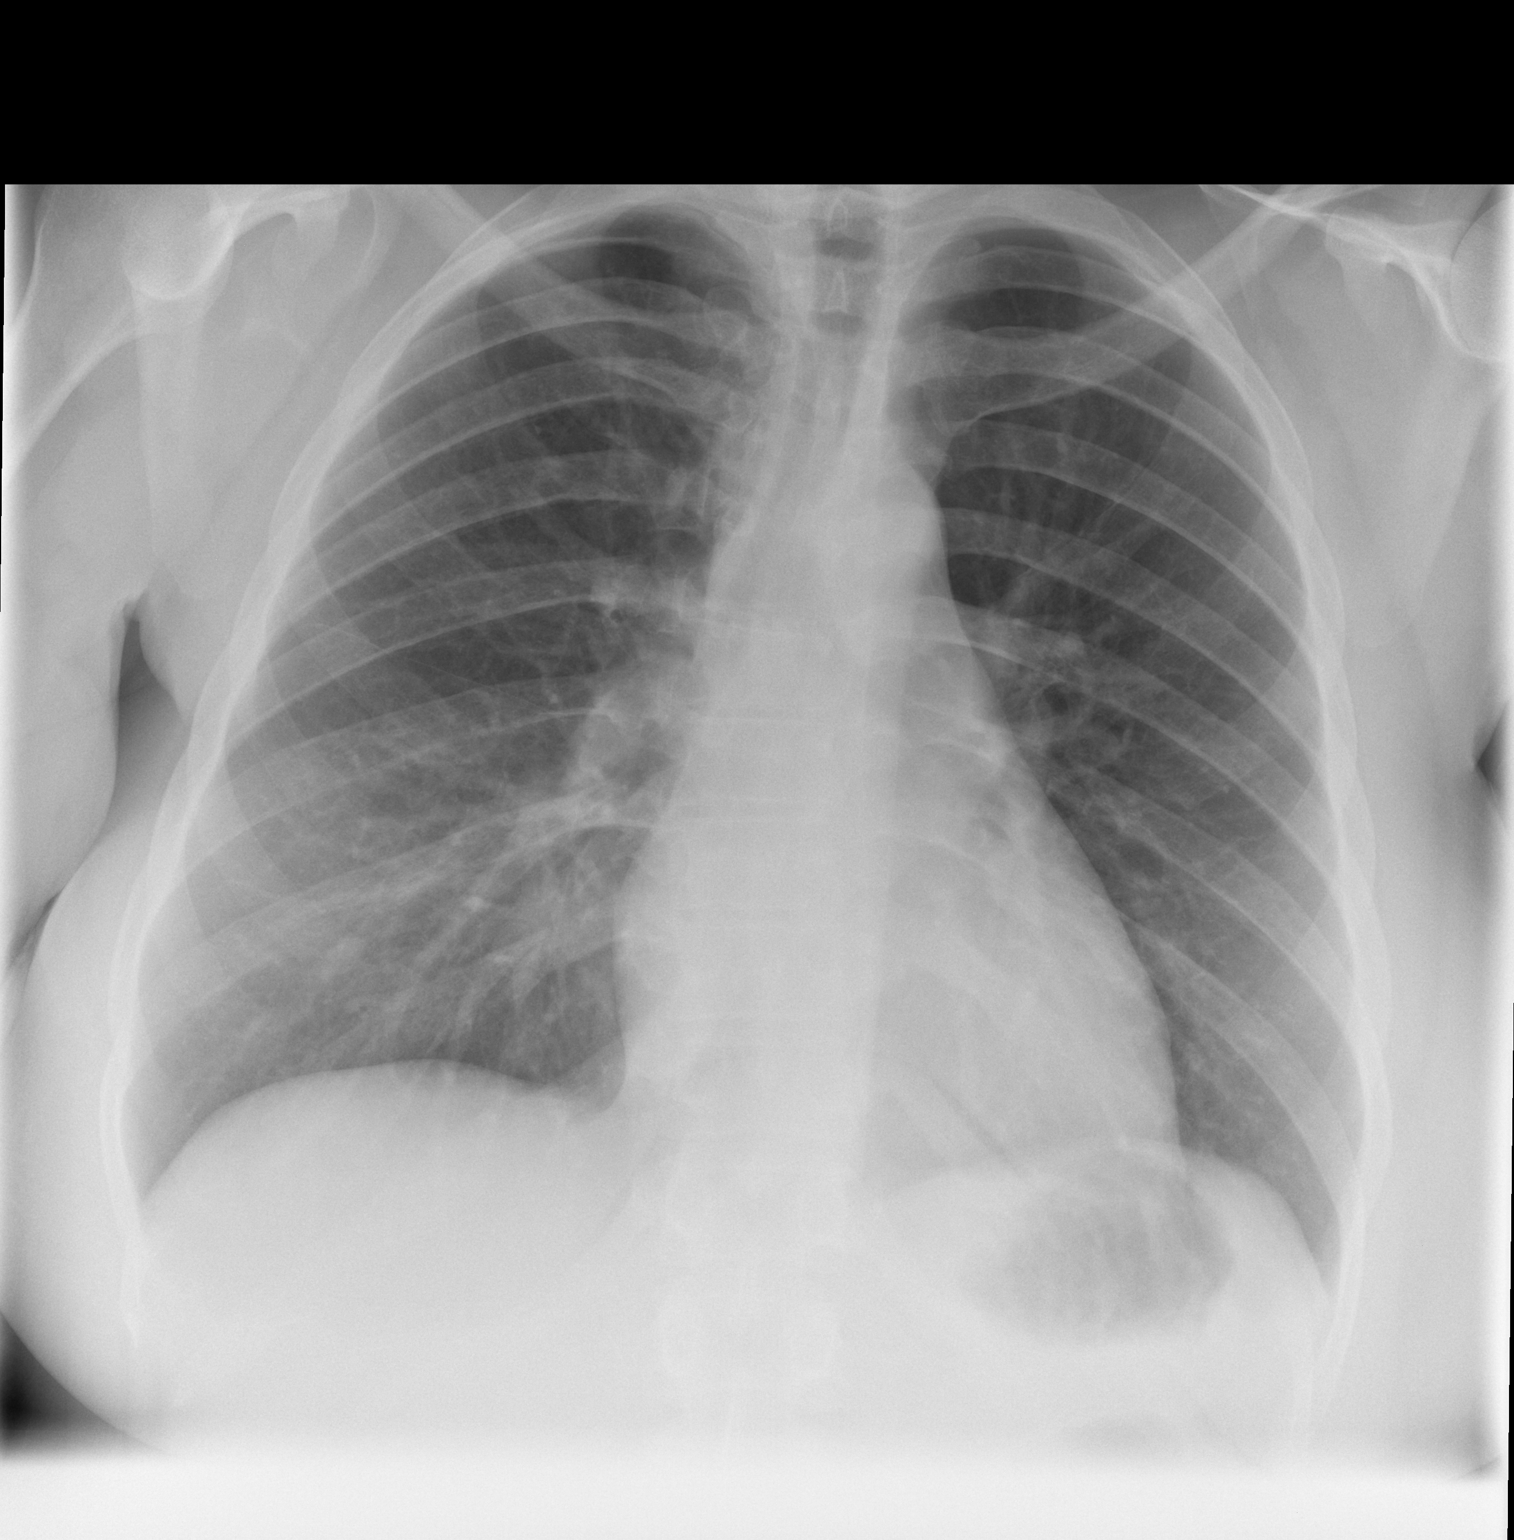

[w chest lat]
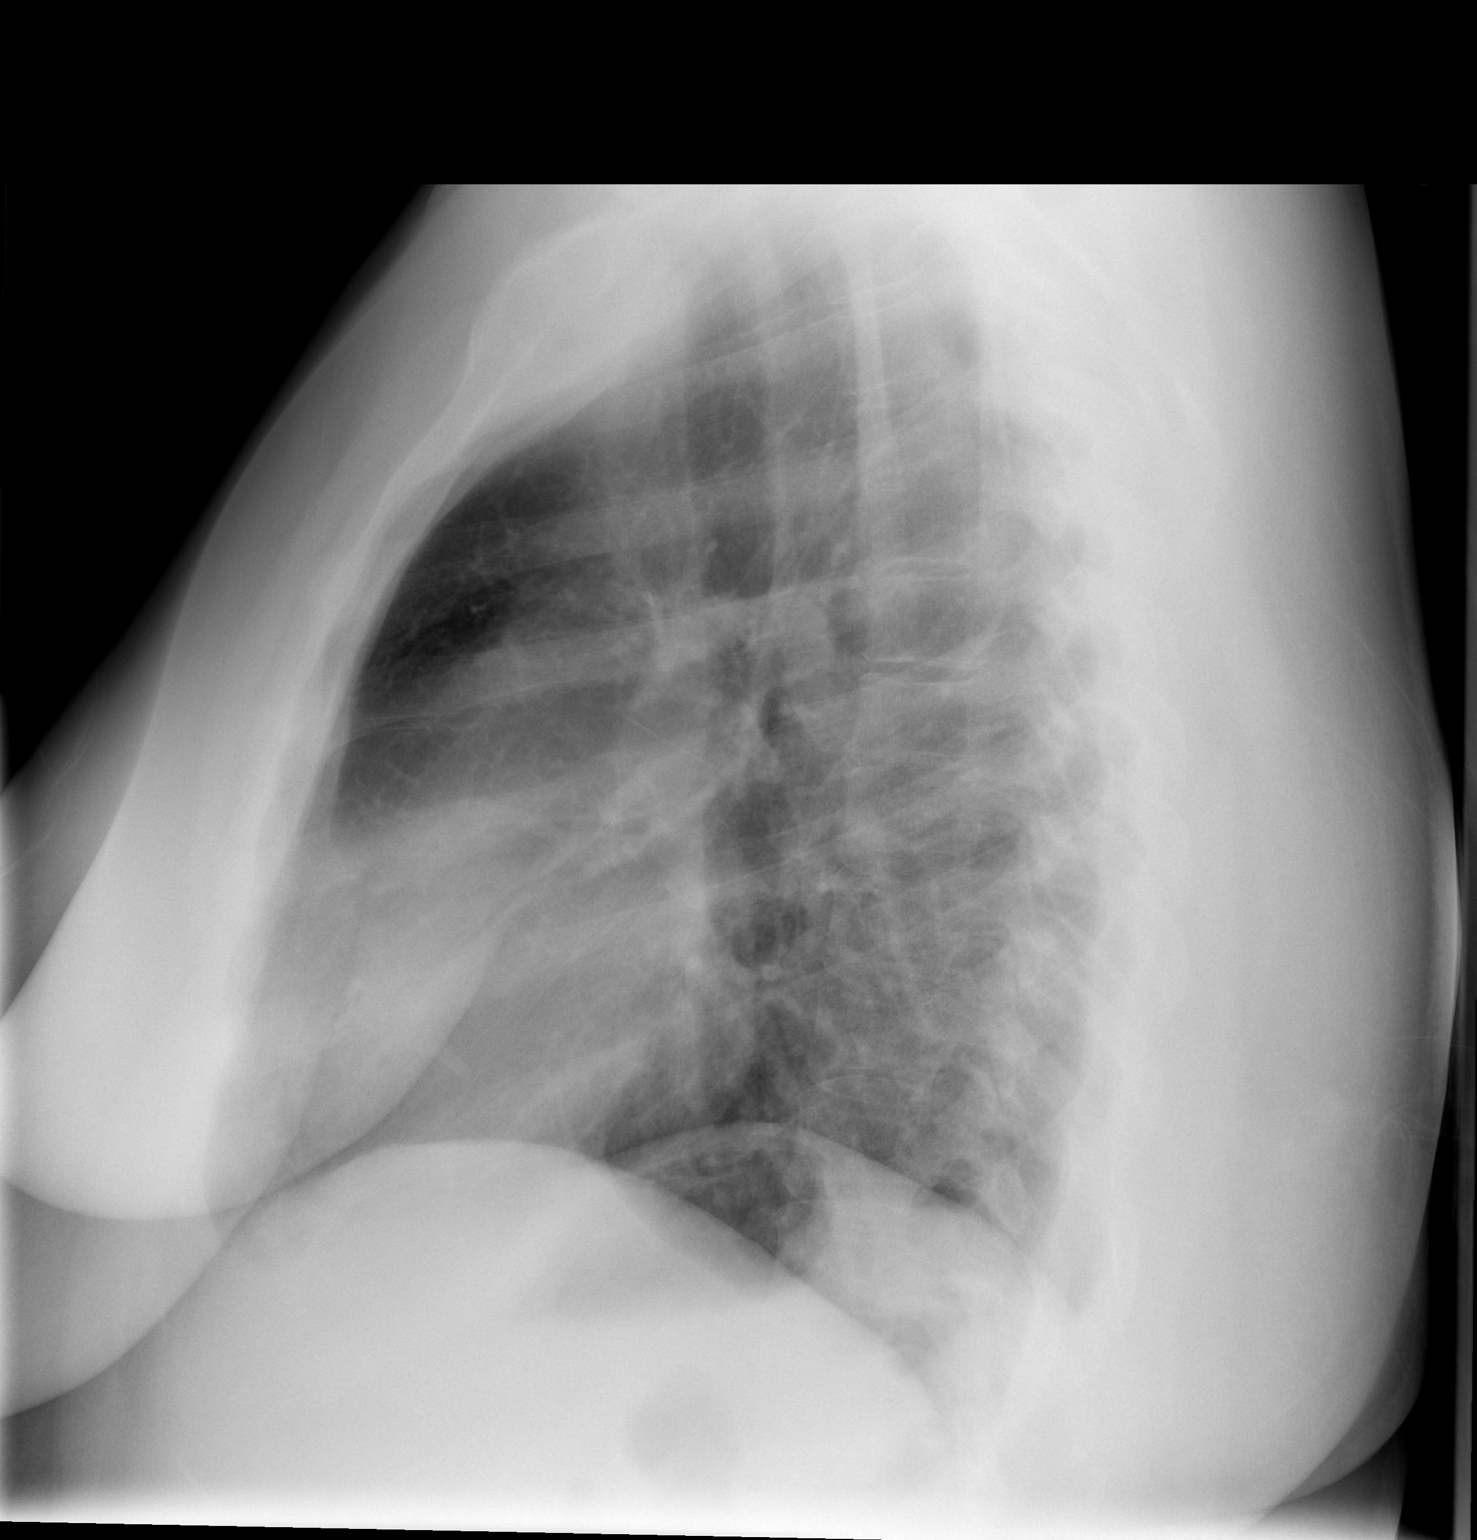

[2 of 2 positions shown; findings below may reference images not displayed]

FINDINGS: The heart size and mediastinal contours are within normal limits.  Both lungs are clear.  The visualized skeletal structures are unremarkable.
IMPRESSION: No active cardiopulmonary disease.

## 2006-04-11 ENCOUNTER — Ambulatory Visit: Payer: Self-pay | Admitting: Internal Medicine

## 2006-04-11 ENCOUNTER — Encounter (INDEPENDENT_AMBULATORY_CARE_PROVIDER_SITE_OTHER): Payer: Self-pay | Admitting: Internal Medicine

## 2006-04-11 DIAGNOSIS — R809 Proteinuria, unspecified: Secondary | ICD-10-CM

## 2006-04-14 ENCOUNTER — Telehealth: Payer: Self-pay | Admitting: *Deleted

## 2006-04-30 ENCOUNTER — Telehealth (INDEPENDENT_AMBULATORY_CARE_PROVIDER_SITE_OTHER): Payer: Self-pay | Admitting: *Deleted

## 2006-04-30 ENCOUNTER — Ambulatory Visit: Payer: Self-pay | Admitting: Hospitalist

## 2006-04-30 DIAGNOSIS — N921 Excessive and frequent menstruation with irregular cycle: Secondary | ICD-10-CM | POA: Insufficient documentation

## 2006-05-02 ENCOUNTER — Telehealth (INDEPENDENT_AMBULATORY_CARE_PROVIDER_SITE_OTHER): Payer: Self-pay | Admitting: *Deleted

## 2006-05-03 LAB — CONVERTED CEMR LAB
AST: 9 units/L (ref 0–37)
Alkaline Phosphatase: 52 units/L (ref 39–117)
BUN: 7 mg/dL (ref 6–23)
Basophils Relative: 1 % (ref 0–1)
Bilirubin Urine: NEGATIVE
Creatinine, Ser: 0.77 mg/dL (ref 0.40–1.20)
Eosinophils Absolute: 0.2 10*3/uL (ref 0.0–0.7)
Hemoglobin: 12.7 g/dL (ref 12.0–15.0)
MCHC: 31.4 g/dL (ref 30.0–36.0)
MCV: 80.7 fL (ref 78.0–100.0)
Monocytes Absolute: 0.6 10*3/uL (ref 0.2–0.7)
Monocytes Relative: 10 % (ref 3–11)
RBC: 5.02 M/uL (ref 3.87–5.11)
RDW: 18.4 % — ABNORMAL HIGH (ref 11.5–14.0)
Specific Gravity, Urine: 1.027 (ref 1.005–1.03)
Total Bilirubin: 0.5 mg/dL (ref 0.3–1.2)
Urine Glucose: NEGATIVE mg/dL
Urobilinogen, UA: 1 (ref 0.0–1.0)
pH: 8 (ref 5.0–8.0)

## 2006-05-06 ENCOUNTER — Telehealth (INDEPENDENT_AMBULATORY_CARE_PROVIDER_SITE_OTHER): Payer: Self-pay | Admitting: *Deleted

## 2006-05-27 ENCOUNTER — Encounter (INDEPENDENT_AMBULATORY_CARE_PROVIDER_SITE_OTHER): Payer: Self-pay | Admitting: Internal Medicine

## 2006-06-04 ENCOUNTER — Ambulatory Visit: Payer: Self-pay | Admitting: Internal Medicine

## 2006-06-04 ENCOUNTER — Encounter (INDEPENDENT_AMBULATORY_CARE_PROVIDER_SITE_OTHER): Payer: Self-pay | Admitting: Internal Medicine

## 2006-06-04 LAB — CONVERTED CEMR LAB: Protein, Ur: 143 mg/24hr — ABNORMAL HIGH (ref 50–100)

## 2006-06-05 ENCOUNTER — Telehealth (INDEPENDENT_AMBULATORY_CARE_PROVIDER_SITE_OTHER): Payer: Self-pay | Admitting: *Deleted

## 2006-06-18 ENCOUNTER — Telehealth (INDEPENDENT_AMBULATORY_CARE_PROVIDER_SITE_OTHER): Payer: Self-pay | Admitting: Internal Medicine

## 2006-06-26 ENCOUNTER — Ambulatory Visit (HOSPITAL_COMMUNITY): Payer: Self-pay | Admitting: Psychiatry

## 2006-07-06 ENCOUNTER — Emergency Department (HOSPITAL_COMMUNITY): Admission: EM | Admit: 2006-07-06 | Discharge: 2006-07-06 | Payer: Self-pay | Admitting: *Deleted

## 2006-07-16 ENCOUNTER — Ambulatory Visit (HOSPITAL_COMMUNITY): Payer: Self-pay | Admitting: Psychiatry

## 2006-07-17 ENCOUNTER — Emergency Department (HOSPITAL_COMMUNITY): Admission: EM | Admit: 2006-07-17 | Discharge: 2006-07-17 | Payer: Self-pay | Admitting: Emergency Medicine

## 2006-10-16 ENCOUNTER — Emergency Department (HOSPITAL_COMMUNITY): Admission: EM | Admit: 2006-10-16 | Discharge: 2006-10-16 | Payer: Self-pay | Admitting: Emergency Medicine

## 2006-11-07 ENCOUNTER — Emergency Department (HOSPITAL_COMMUNITY): Admission: EM | Admit: 2006-11-07 | Discharge: 2006-11-07 | Payer: Self-pay | Admitting: Emergency Medicine

## 2006-11-11 ENCOUNTER — Ambulatory Visit (HOSPITAL_COMMUNITY): Payer: Self-pay | Admitting: Psychiatry

## 2006-11-13 ENCOUNTER — Ambulatory Visit (HOSPITAL_COMMUNITY): Payer: Self-pay | Admitting: Marriage and Family Therapist

## 2006-11-20 ENCOUNTER — Ambulatory Visit (HOSPITAL_COMMUNITY): Payer: Self-pay | Admitting: Marriage and Family Therapist

## 2006-12-04 ENCOUNTER — Ambulatory Visit (HOSPITAL_COMMUNITY): Payer: Self-pay | Admitting: Marriage and Family Therapist

## 2006-12-11 ENCOUNTER — Ambulatory Visit (HOSPITAL_COMMUNITY): Payer: Self-pay | Admitting: Marriage and Family Therapist

## 2006-12-15 ENCOUNTER — Ambulatory Visit (HOSPITAL_COMMUNITY): Payer: Self-pay | Admitting: Psychiatry

## 2006-12-18 ENCOUNTER — Ambulatory Visit (HOSPITAL_COMMUNITY): Payer: Self-pay | Admitting: Marriage and Family Therapist

## 2006-12-22 ENCOUNTER — Ambulatory Visit (HOSPITAL_COMMUNITY): Payer: Self-pay | Admitting: Marriage and Family Therapist

## 2007-01-01 ENCOUNTER — Ambulatory Visit (HOSPITAL_COMMUNITY): Payer: Self-pay | Admitting: Marriage and Family Therapist

## 2007-01-08 ENCOUNTER — Ambulatory Visit (HOSPITAL_COMMUNITY): Payer: Self-pay | Admitting: Marriage and Family Therapist

## 2007-01-13 ENCOUNTER — Ambulatory Visit (HOSPITAL_COMMUNITY): Payer: Self-pay | Admitting: Psychiatry

## 2007-01-15 ENCOUNTER — Ambulatory Visit (HOSPITAL_COMMUNITY): Payer: Self-pay | Admitting: Marriage and Family Therapist

## 2007-01-29 DIAGNOSIS — K635 Polyp of colon: Secondary | ICD-10-CM

## 2007-01-29 HISTORY — DX: Polyp of colon: K63.5

## 2007-02-01 ENCOUNTER — Emergency Department (HOSPITAL_COMMUNITY): Admission: EM | Admit: 2007-02-01 | Discharge: 2007-02-02 | Payer: Self-pay | Admitting: Emergency Medicine

## 2007-02-06 ENCOUNTER — Ambulatory Visit (HOSPITAL_COMMUNITY): Payer: Self-pay | Admitting: Marriage and Family Therapist

## 2007-02-10 ENCOUNTER — Ambulatory Visit (HOSPITAL_COMMUNITY): Payer: Self-pay | Admitting: Psychiatry

## 2007-02-12 ENCOUNTER — Ambulatory Visit (HOSPITAL_COMMUNITY): Payer: Self-pay | Admitting: Marriage and Family Therapist

## 2007-02-19 ENCOUNTER — Ambulatory Visit (HOSPITAL_COMMUNITY): Payer: Self-pay | Admitting: Marriage and Family Therapist

## 2007-02-26 ENCOUNTER — Ambulatory Visit (HOSPITAL_COMMUNITY): Payer: Self-pay | Admitting: Marriage and Family Therapist

## 2007-03-06 ENCOUNTER — Ambulatory Visit (HOSPITAL_COMMUNITY): Payer: Self-pay | Admitting: Marriage and Family Therapist

## 2007-03-10 ENCOUNTER — Ambulatory Visit (HOSPITAL_COMMUNITY): Payer: Self-pay | Admitting: Marriage and Family Therapist

## 2007-03-12 ENCOUNTER — Ambulatory Visit (HOSPITAL_COMMUNITY): Payer: Self-pay | Admitting: Marriage and Family Therapist

## 2007-03-17 ENCOUNTER — Ambulatory Visit (HOSPITAL_COMMUNITY): Payer: Self-pay | Admitting: Marriage and Family Therapist

## 2007-03-18 ENCOUNTER — Ambulatory Visit (HOSPITAL_COMMUNITY): Payer: Self-pay | Admitting: Psychiatry

## 2007-03-19 ENCOUNTER — Ambulatory Visit (HOSPITAL_COMMUNITY): Payer: Self-pay | Admitting: Marriage and Family Therapist

## 2007-03-24 ENCOUNTER — Ambulatory Visit (HOSPITAL_COMMUNITY): Payer: Self-pay | Admitting: Marriage and Family Therapist

## 2007-03-26 ENCOUNTER — Ambulatory Visit (HOSPITAL_COMMUNITY): Payer: Self-pay | Admitting: Marriage and Family Therapist

## 2007-03-31 ENCOUNTER — Ambulatory Visit (HOSPITAL_COMMUNITY): Payer: Self-pay | Admitting: Psychiatry

## 2007-04-02 ENCOUNTER — Ambulatory Visit (HOSPITAL_COMMUNITY): Payer: Self-pay | Admitting: Marriage and Family Therapist

## 2007-04-07 ENCOUNTER — Ambulatory Visit (HOSPITAL_COMMUNITY): Payer: Self-pay | Admitting: Psychiatry

## 2007-04-09 ENCOUNTER — Ambulatory Visit (HOSPITAL_COMMUNITY): Payer: Self-pay | Admitting: Psychiatry

## 2007-04-14 ENCOUNTER — Ambulatory Visit (HOSPITAL_COMMUNITY): Payer: Self-pay | Admitting: Psychiatry

## 2007-04-16 ENCOUNTER — Ambulatory Visit (HOSPITAL_COMMUNITY): Payer: Self-pay | Admitting: Marriage and Family Therapist

## 2007-04-23 ENCOUNTER — Ambulatory Visit (HOSPITAL_COMMUNITY): Payer: Self-pay | Admitting: Marriage and Family Therapist

## 2007-04-30 ENCOUNTER — Ambulatory Visit (HOSPITAL_COMMUNITY): Payer: Self-pay | Admitting: Marriage and Family Therapist

## 2007-05-07 ENCOUNTER — Ambulatory Visit (HOSPITAL_COMMUNITY): Payer: Self-pay | Admitting: Marriage and Family Therapist

## 2007-05-14 ENCOUNTER — Ambulatory Visit (HOSPITAL_COMMUNITY): Payer: Self-pay | Admitting: Marriage and Family Therapist

## 2007-05-15 ENCOUNTER — Ambulatory Visit (HOSPITAL_COMMUNITY): Payer: Self-pay | Admitting: Psychiatry

## 2007-05-18 ENCOUNTER — Ambulatory Visit: Payer: Self-pay | Admitting: Infectious Disease

## 2007-05-18 ENCOUNTER — Ambulatory Visit (HOSPITAL_COMMUNITY): Admission: RE | Admit: 2007-05-18 | Discharge: 2007-05-18 | Payer: Self-pay | Admitting: Infectious Disease

## 2007-05-18 ENCOUNTER — Encounter (INDEPENDENT_AMBULATORY_CARE_PROVIDER_SITE_OTHER): Payer: Self-pay | Admitting: Infectious Diseases

## 2007-05-18 DIAGNOSIS — K645 Perianal venous thrombosis: Secondary | ICD-10-CM

## 2007-05-18 LAB — CONVERTED CEMR LAB
ALT: 12 units/L (ref 0–35)
Alkaline Phosphatase: 51 units/L (ref 39–117)
Basophils Absolute: 0.1 10*3/uL (ref 0.0–0.1)
CO2: 28 meq/L (ref 19–32)
Eosinophils Absolute: 0.1 10*3/uL (ref 0.0–0.7)
Eosinophils Relative: 1 % (ref 0–5)
HCT: 37.4 % (ref 36.0–46.0)
Lymphocytes Relative: 43 % (ref 12–46)
Platelets: 273 10*3/uL (ref 150–400)
RDW: 16.1 % — ABNORMAL HIGH (ref 11.5–15.5)
Sodium: 141 meq/L (ref 135–145)
Total Bilirubin: 0.1 mg/dL — ABNORMAL LOW (ref 0.3–1.2)
Total Protein: 6.2 g/dL (ref 6.0–8.3)

## 2007-05-18 IMAGING — CR DG CHEST 2V
2 series · 2 of 2 positions shown · non-contrast
Comparison: [DATE]

CLINICAL DATA: Shortness of breath

CHEST - 2 VIEW

[w chest pa]
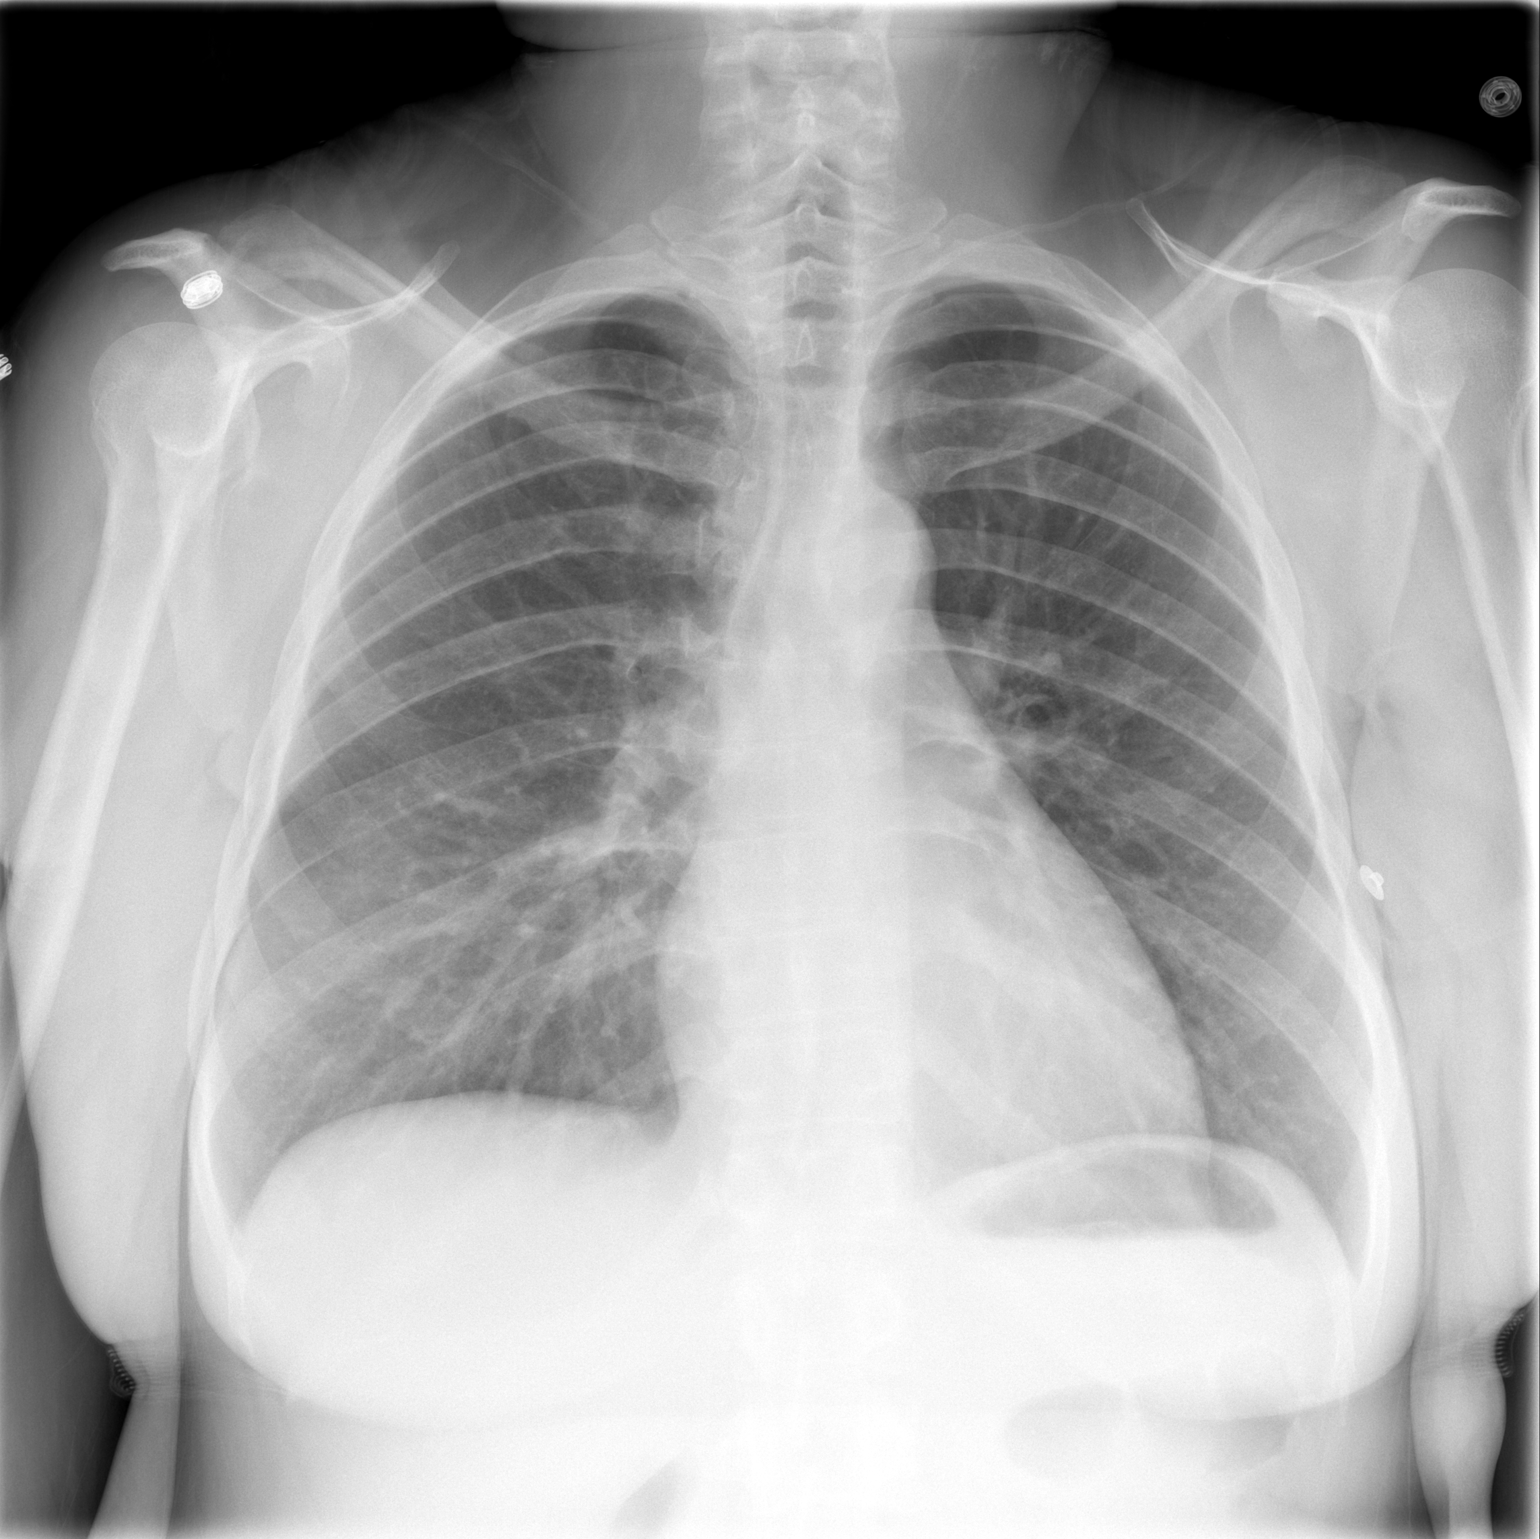

[w chest lat]
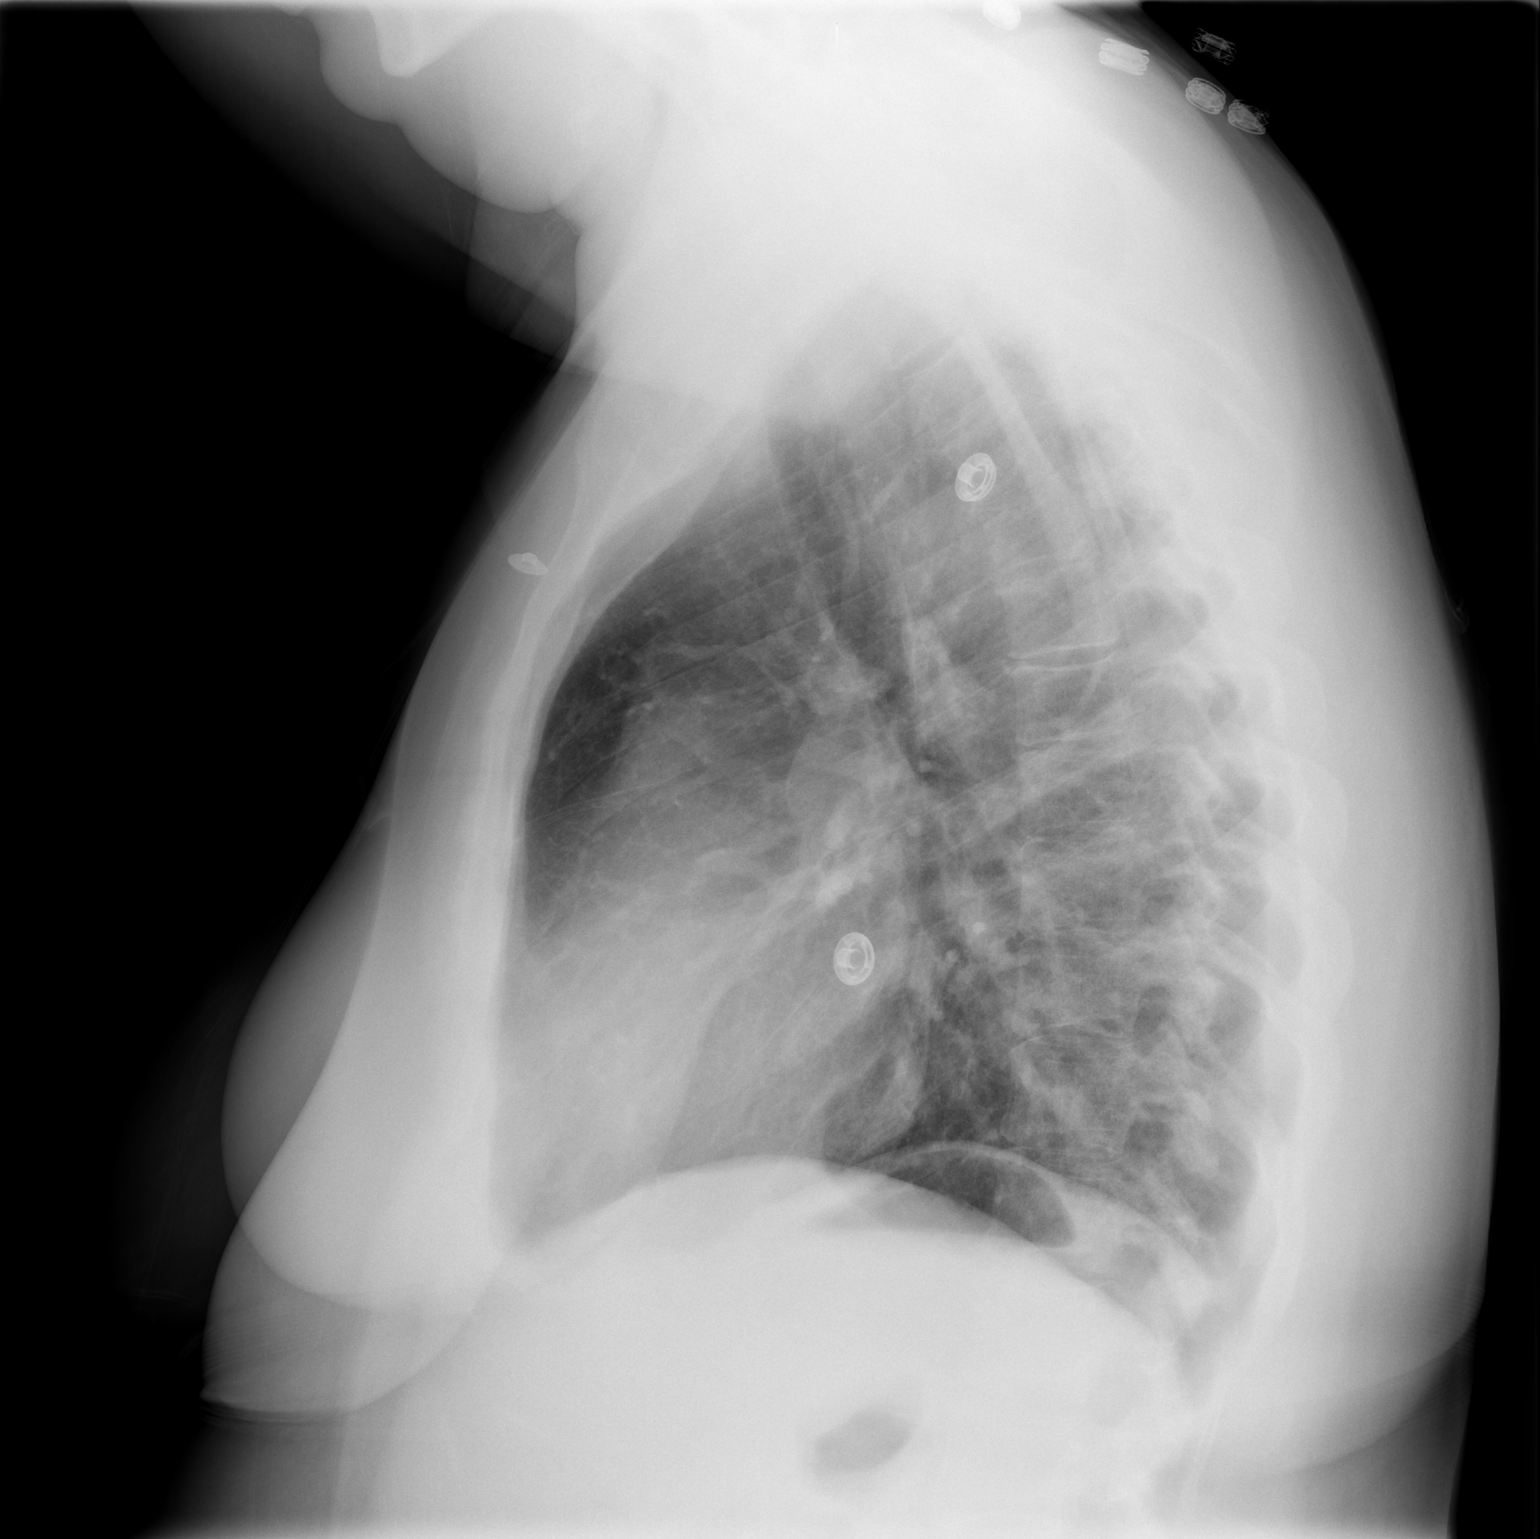

[2 of 2 positions shown; findings below may reference images not displayed]

FINDINGS: Lungs clear.  Heart size and pulmonary vascularity
normal.  No effusion.  Visualized bones unremarkable.]
IMPRESSION: [1.  No acute disease

## 2007-05-21 ENCOUNTER — Ambulatory Visit (HOSPITAL_COMMUNITY): Payer: Self-pay | Admitting: Marriage and Family Therapist

## 2007-05-25 ENCOUNTER — Ambulatory Visit (HOSPITAL_COMMUNITY): Payer: Self-pay | Admitting: Marriage and Family Therapist

## 2007-05-28 ENCOUNTER — Ambulatory Visit (HOSPITAL_COMMUNITY): Payer: Self-pay | Admitting: Marriage and Family Therapist

## 2007-06-02 ENCOUNTER — Ambulatory Visit (HOSPITAL_COMMUNITY): Payer: Self-pay | Admitting: Marriage and Family Therapist

## 2007-06-04 ENCOUNTER — Ambulatory Visit (HOSPITAL_COMMUNITY): Payer: Self-pay | Admitting: Marriage and Family Therapist

## 2007-06-11 ENCOUNTER — Ambulatory Visit: Payer: Self-pay | Admitting: Vascular Surgery

## 2007-06-11 ENCOUNTER — Ambulatory Visit (HOSPITAL_COMMUNITY): Payer: Self-pay | Admitting: Marriage and Family Therapist

## 2007-06-11 ENCOUNTER — Encounter (INDEPENDENT_AMBULATORY_CARE_PROVIDER_SITE_OTHER): Payer: Self-pay | Admitting: Emergency Medicine

## 2007-06-11 ENCOUNTER — Emergency Department (HOSPITAL_COMMUNITY): Admission: EM | Admit: 2007-06-11 | Discharge: 2007-06-11 | Payer: Self-pay | Admitting: Emergency Medicine

## 2007-06-16 ENCOUNTER — Ambulatory Visit: Payer: Self-pay | Admitting: Internal Medicine

## 2007-06-16 ENCOUNTER — Encounter (INDEPENDENT_AMBULATORY_CARE_PROVIDER_SITE_OTHER): Payer: Self-pay | Admitting: Internal Medicine

## 2007-06-16 DIAGNOSIS — L03119 Cellulitis of unspecified part of limb: Secondary | ICD-10-CM

## 2007-06-16 DIAGNOSIS — R634 Abnormal weight loss: Secondary | ICD-10-CM

## 2007-06-16 DIAGNOSIS — K589 Irritable bowel syndrome without diarrhea: Secondary | ICD-10-CM | POA: Insufficient documentation

## 2007-06-16 DIAGNOSIS — L02419 Cutaneous abscess of limb, unspecified: Secondary | ICD-10-CM

## 2007-06-16 LAB — CONVERTED CEMR LAB: GC Probe Amp, Genital: NEGATIVE

## 2007-06-17 ENCOUNTER — Ambulatory Visit (HOSPITAL_COMMUNITY): Payer: Self-pay | Admitting: Marriage and Family Therapist

## 2007-06-24 ENCOUNTER — Ambulatory Visit (HOSPITAL_COMMUNITY): Payer: Self-pay | Admitting: Marriage and Family Therapist

## 2007-07-02 ENCOUNTER — Ambulatory Visit (HOSPITAL_COMMUNITY): Payer: Self-pay | Admitting: Marriage and Family Therapist

## 2007-07-09 ENCOUNTER — Ambulatory Visit (HOSPITAL_COMMUNITY): Payer: Self-pay | Admitting: Marriage and Family Therapist

## 2007-07-14 ENCOUNTER — Ambulatory Visit (HOSPITAL_COMMUNITY): Payer: Self-pay | Admitting: Psychiatry

## 2007-07-14 ENCOUNTER — Ambulatory Visit: Payer: Self-pay | Admitting: Gastroenterology

## 2007-07-14 LAB — CONVERTED CEMR LAB
Albumin: 3.8 g/dL (ref 3.5–5.2)
Alkaline Phosphatase: 52 units/L (ref 39–117)
BUN: 4 mg/dL — ABNORMAL LOW (ref 6–23)
Eosinophils Relative: 1.4 % (ref 0.0–5.0)
GFR calc Af Amer: 104 mL/min
GFR calc non Af Amer: 86 mL/min
Glucose, Bld: 70 mg/dL (ref 70–99)
HCT: 36.5 % (ref 36.0–46.0)
Hemoglobin: 11.9 g/dL — ABNORMAL LOW (ref 12.0–15.0)
Monocytes Absolute: 0.8 10*3/uL (ref 0.1–1.0)
Monocytes Relative: 12.1 % — ABNORMAL HIGH (ref 3.0–12.0)
Neutro Abs: 2.5 10*3/uL (ref 1.4–7.7)
RBC: 4.34 M/uL (ref 3.87–5.11)
Total Bilirubin: 0.5 mg/dL (ref 0.3–1.2)
WBC: 6.5 10*3/uL (ref 4.5–10.5)

## 2007-07-15 ENCOUNTER — Telehealth: Payer: Self-pay | Admitting: Gastroenterology

## 2007-07-16 ENCOUNTER — Ambulatory Visit (HOSPITAL_COMMUNITY): Payer: Self-pay | Admitting: Marriage and Family Therapist

## 2007-07-23 ENCOUNTER — Ambulatory Visit (HOSPITAL_COMMUNITY): Payer: Self-pay | Admitting: Marriage and Family Therapist

## 2007-07-30 ENCOUNTER — Ambulatory Visit (HOSPITAL_COMMUNITY): Payer: Self-pay | Admitting: Marriage and Family Therapist

## 2007-08-04 ENCOUNTER — Telehealth (INDEPENDENT_AMBULATORY_CARE_PROVIDER_SITE_OTHER): Payer: Self-pay | Admitting: *Deleted

## 2007-08-06 ENCOUNTER — Ambulatory Visit (HOSPITAL_COMMUNITY): Admission: RE | Admit: 2007-08-06 | Discharge: 2007-08-06 | Payer: Self-pay | Admitting: Gastroenterology

## 2007-08-06 ENCOUNTER — Encounter: Payer: Self-pay | Admitting: Gastroenterology

## 2007-08-07 ENCOUNTER — Ambulatory Visit (HOSPITAL_COMMUNITY): Payer: Self-pay | Admitting: Marriage and Family Therapist

## 2007-08-09 ENCOUNTER — Encounter: Payer: Self-pay | Admitting: Gastroenterology

## 2007-08-11 ENCOUNTER — Ambulatory Visit: Payer: Self-pay | Admitting: Gastroenterology

## 2007-08-13 ENCOUNTER — Ambulatory Visit (HOSPITAL_COMMUNITY): Payer: Self-pay | Admitting: Marriage and Family Therapist

## 2007-08-20 ENCOUNTER — Ambulatory Visit (HOSPITAL_COMMUNITY): Payer: Self-pay | Admitting: Marriage and Family Therapist

## 2007-08-27 ENCOUNTER — Ambulatory Visit (HOSPITAL_COMMUNITY): Payer: Self-pay | Admitting: Marriage and Family Therapist

## 2007-08-28 ENCOUNTER — Ambulatory Visit (HOSPITAL_COMMUNITY): Payer: Self-pay | Admitting: Psychiatry

## 2007-09-03 ENCOUNTER — Ambulatory Visit (HOSPITAL_COMMUNITY): Payer: Self-pay | Admitting: Marriage and Family Therapist

## 2007-09-10 ENCOUNTER — Ambulatory Visit (HOSPITAL_COMMUNITY): Payer: Self-pay | Admitting: Marriage and Family Therapist

## 2007-09-25 ENCOUNTER — Emergency Department (HOSPITAL_COMMUNITY): Admission: EM | Admit: 2007-09-25 | Discharge: 2007-09-25 | Payer: Self-pay | Admitting: Emergency Medicine
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
## 2007-09-28 ENCOUNTER — Ambulatory Visit (HOSPITAL_COMMUNITY): Payer: Self-pay | Admitting: Marriage and Family Therapist

## 2007-09-29 ENCOUNTER — Ambulatory Visit (HOSPITAL_COMMUNITY): Payer: Self-pay | Admitting: Marriage and Family Therapist

## 2007-10-12 ENCOUNTER — Encounter (INDEPENDENT_AMBULATORY_CARE_PROVIDER_SITE_OTHER): Payer: Self-pay | Admitting: Internal Medicine

## 2007-10-12 ENCOUNTER — Ambulatory Visit: Payer: Self-pay | Admitting: Internal Medicine

## 2007-10-12 ENCOUNTER — Telehealth (INDEPENDENT_AMBULATORY_CARE_PROVIDER_SITE_OTHER): Payer: Self-pay | Admitting: Internal Medicine

## 2007-10-12 ENCOUNTER — Ambulatory Visit (HOSPITAL_COMMUNITY): Payer: Self-pay | Admitting: Marriage and Family Therapist

## 2007-10-12 DIAGNOSIS — N912 Amenorrhea, unspecified: Secondary | ICD-10-CM

## 2007-10-12 LAB — CONVERTED CEMR LAB: Beta hcg, urine, semiquantitative: POSITIVE

## 2007-10-13 ENCOUNTER — Ambulatory Visit (HOSPITAL_COMMUNITY): Payer: Self-pay | Admitting: Psychiatry

## 2007-10-22 ENCOUNTER — Ambulatory Visit (HOSPITAL_COMMUNITY): Payer: Self-pay | Admitting: Marriage and Family Therapist

## 2007-10-22 ENCOUNTER — Telehealth: Payer: Self-pay | Admitting: *Deleted

## 2007-10-25 ENCOUNTER — Emergency Department (HOSPITAL_COMMUNITY): Admission: EM | Admit: 2007-10-25 | Discharge: 2007-10-25 | Payer: Self-pay | Admitting: Emergency Medicine
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
## 2007-10-29 ENCOUNTER — Ambulatory Visit (HOSPITAL_COMMUNITY): Payer: Self-pay | Admitting: Marriage and Family Therapist

## 2007-11-03 ENCOUNTER — Emergency Department (HOSPITAL_COMMUNITY): Admission: EM | Admit: 2007-11-03 | Discharge: 2007-11-03 | Payer: Self-pay | Admitting: Emergency Medicine

## 2007-11-03 IMAGING — CR DG WRIST COMPLETE 3+V*L*
4 series · 4 of 4 positions shown · non-contrast
Comparison: None

CLINICAL DATA: Pain

LEFT WRIST - COMPLETE 3+ VIEW

[x wrist pa left]
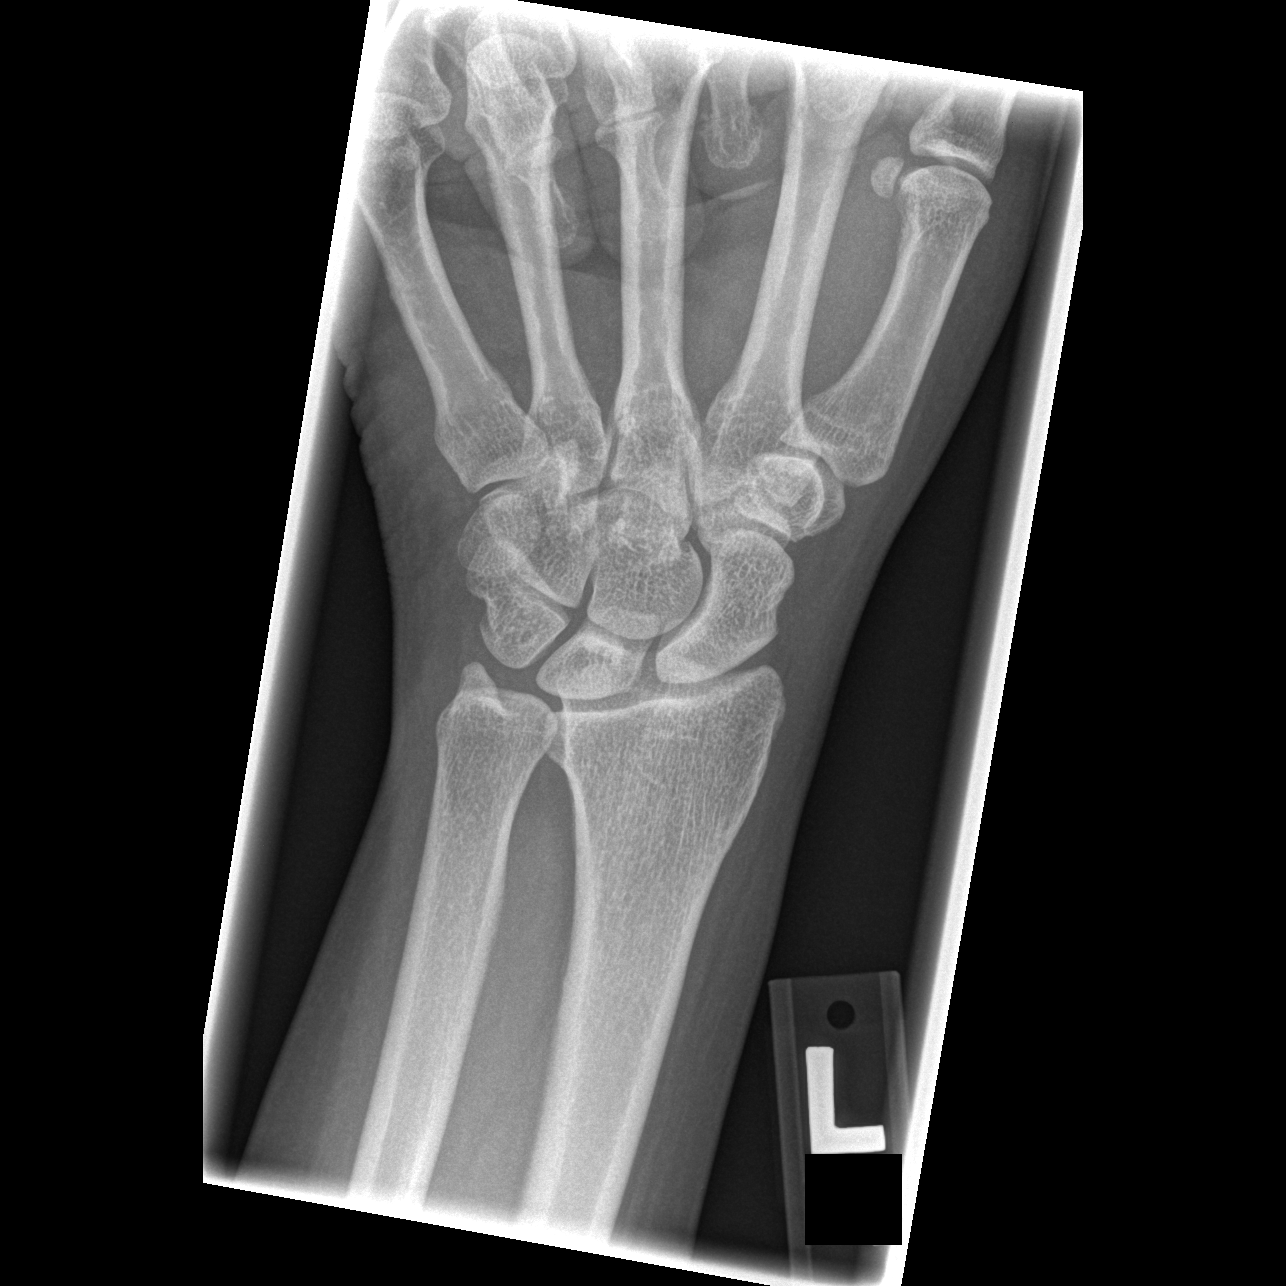

[x wrist obl left]
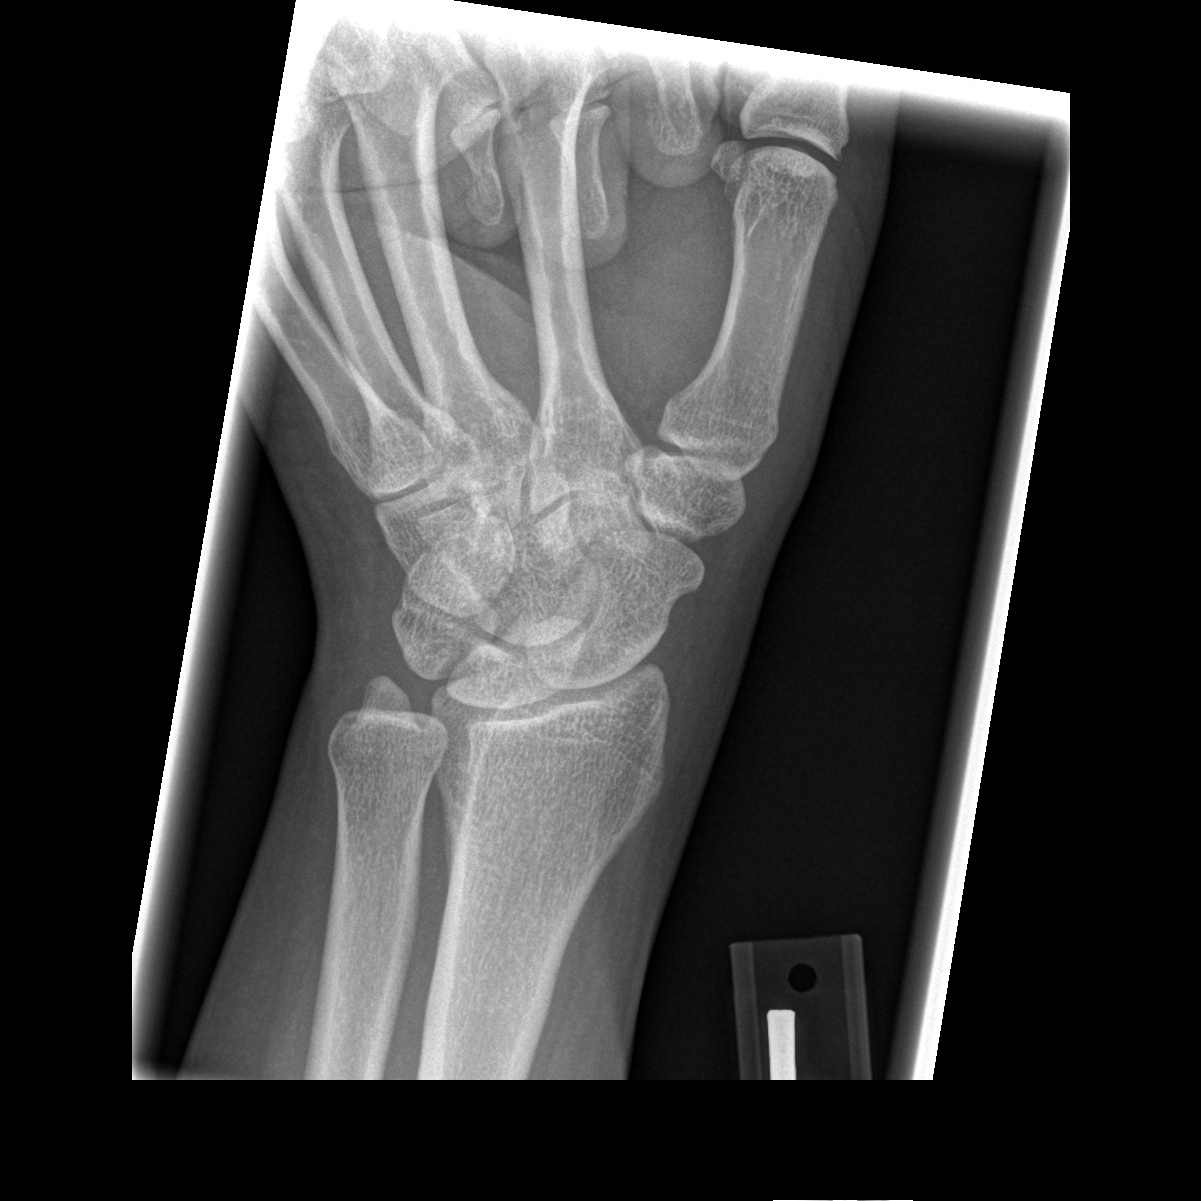

[x wrist lat left]
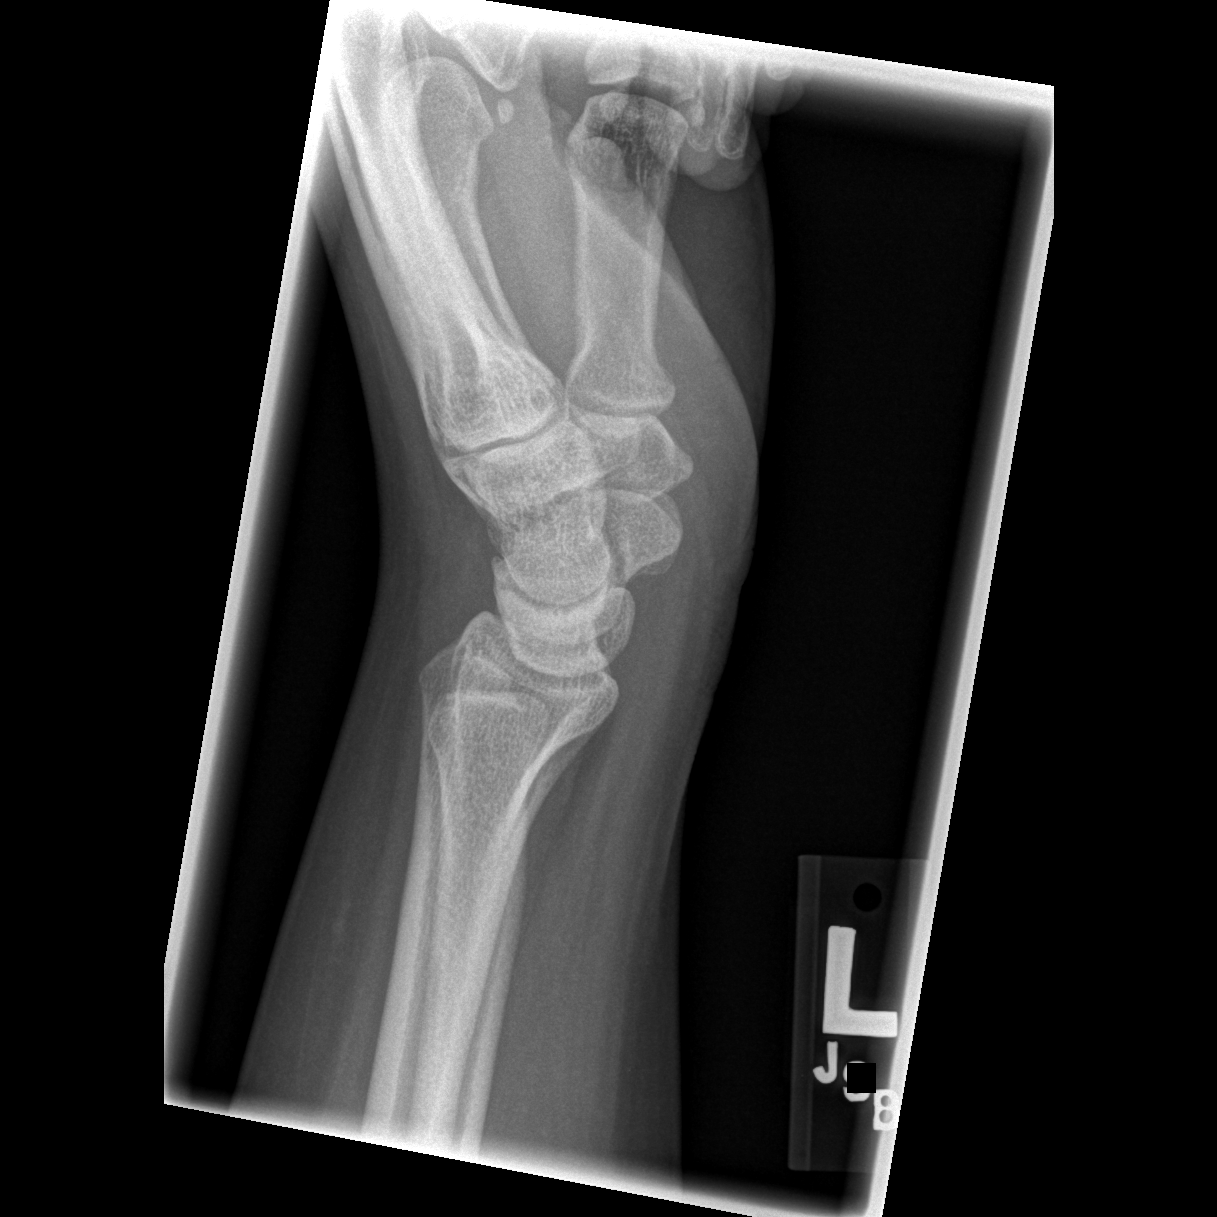

[x navicular]
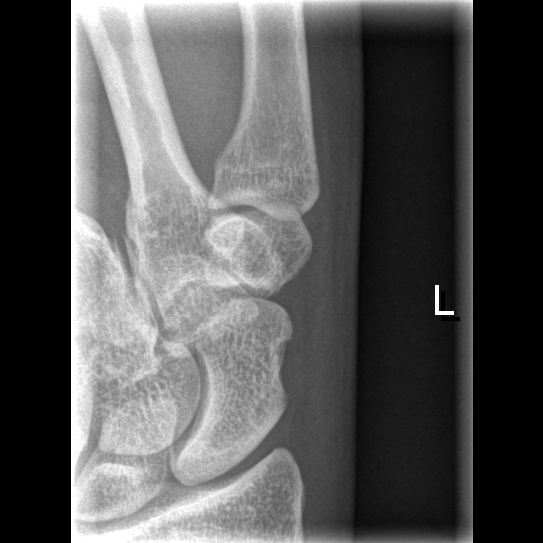

[4 of 4 positions shown; findings below may reference images not displayed]

FINDINGS: There is no evidence of fracture or dislocation.  There
is no evidence of arthropathy or other focal bone abnormality.
Soft tissues are unremarkable.
IMPRESSION: Negative.

## 2007-11-12 ENCOUNTER — Ambulatory Visit (HOSPITAL_COMMUNITY): Payer: Self-pay | Admitting: Marriage and Family Therapist

## 2007-11-19 ENCOUNTER — Ambulatory Visit (HOSPITAL_COMMUNITY): Payer: Self-pay | Admitting: Marriage and Family Therapist

## 2007-11-26 ENCOUNTER — Ambulatory Visit (HOSPITAL_COMMUNITY): Payer: Self-pay | Admitting: Marriage and Family Therapist

## 2007-12-03 ENCOUNTER — Ambulatory Visit (HOSPITAL_COMMUNITY): Payer: Self-pay | Admitting: Marriage and Family Therapist

## 2007-12-10 ENCOUNTER — Ambulatory Visit (HOSPITAL_COMMUNITY): Payer: Self-pay | Admitting: Marriage and Family Therapist

## 2007-12-17 ENCOUNTER — Ambulatory Visit (HOSPITAL_COMMUNITY): Payer: Self-pay | Admitting: Marriage and Family Therapist

## 2007-12-21 ENCOUNTER — Ambulatory Visit (HOSPITAL_COMMUNITY): Payer: Self-pay | Admitting: Marriage and Family Therapist

## 2007-12-23 ENCOUNTER — Ambulatory Visit (HOSPITAL_COMMUNITY): Payer: Self-pay | Admitting: Psychiatry

## 2007-12-30 ENCOUNTER — Ambulatory Visit (HOSPITAL_COMMUNITY): Payer: Self-pay | Admitting: Marriage and Family Therapist

## 2008-01-07 ENCOUNTER — Ambulatory Visit (HOSPITAL_COMMUNITY): Payer: Self-pay | Admitting: Marriage and Family Therapist

## 2008-01-14 ENCOUNTER — Ambulatory Visit (HOSPITAL_COMMUNITY): Payer: Self-pay | Admitting: Marriage and Family Therapist

## 2008-01-19 ENCOUNTER — Ambulatory Visit (HOSPITAL_COMMUNITY): Payer: Self-pay | Admitting: Marriage and Family Therapist

## 2008-01-26 ENCOUNTER — Ambulatory Visit (HOSPITAL_COMMUNITY): Payer: Self-pay | Admitting: Marriage and Family Therapist

## 2008-02-04 ENCOUNTER — Ambulatory Visit (HOSPITAL_COMMUNITY): Payer: Self-pay | Admitting: Marriage and Family Therapist

## 2008-02-09 ENCOUNTER — Ambulatory Visit (HOSPITAL_COMMUNITY): Payer: Self-pay | Admitting: Marriage and Family Therapist

## 2008-02-10 ENCOUNTER — Ambulatory Visit (HOSPITAL_COMMUNITY): Payer: Self-pay | Admitting: Psychiatry

## 2008-02-18 ENCOUNTER — Ambulatory Visit (HOSPITAL_COMMUNITY): Payer: Self-pay | Admitting: Marriage and Family Therapist

## 2008-02-25 ENCOUNTER — Ambulatory Visit (HOSPITAL_COMMUNITY): Payer: Self-pay | Admitting: Marriage and Family Therapist

## 2008-03-03 ENCOUNTER — Ambulatory Visit (HOSPITAL_COMMUNITY): Payer: Self-pay | Admitting: Marriage and Family Therapist

## 2008-03-10 ENCOUNTER — Ambulatory Visit (HOSPITAL_COMMUNITY): Payer: Self-pay | Admitting: Marriage and Family Therapist

## 2008-03-15 ENCOUNTER — Ambulatory Visit (HOSPITAL_COMMUNITY): Payer: Self-pay | Admitting: Marriage and Family Therapist

## 2008-03-21 ENCOUNTER — Emergency Department (HOSPITAL_COMMUNITY): Admission: EM | Admit: 2008-03-21 | Discharge: 2008-03-21 | Payer: Self-pay | Admitting: Emergency Medicine

## 2008-03-21 IMAGING — CT CT HEAD W/O CM
1 series · 16 of 30 positions shown, 20 images · non-contrast
Comparison: [DATE]

CLINICAL DATA: Headache.  Nausea.

CT HEAD WITHOUT CONTRAST
TECHNIQUE: Contiguous axial images were obtained from the base of
the skull through the vertex without contrast.

[Series 2: head_seq 4.5 h37s st · axial · 0.43mm/px · z∈[-219,-75]mm · 16 of 36 slices shown, 20 images]
[im 2/36  brain]
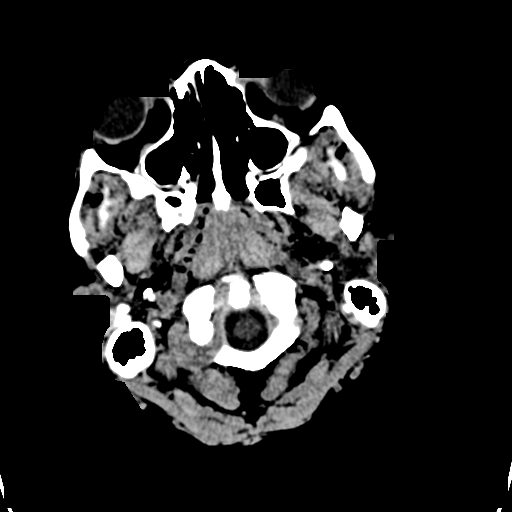
[im 2/36  bone]
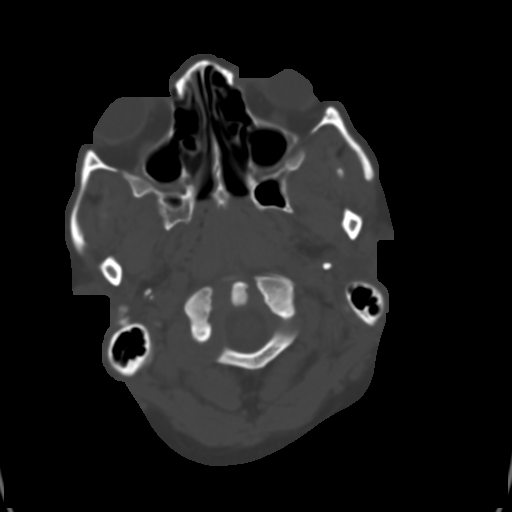
[im 4/36  brain]
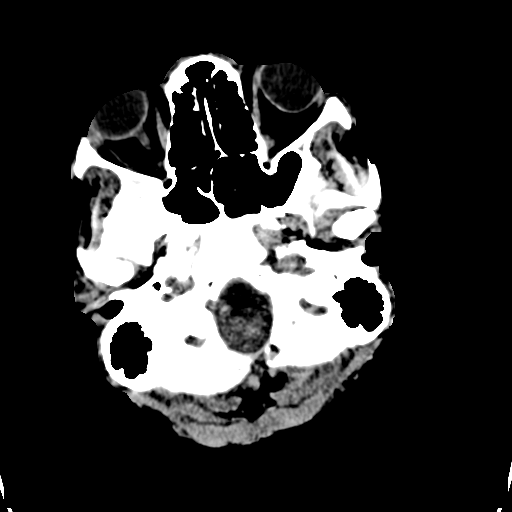
[im 7/36  brain]
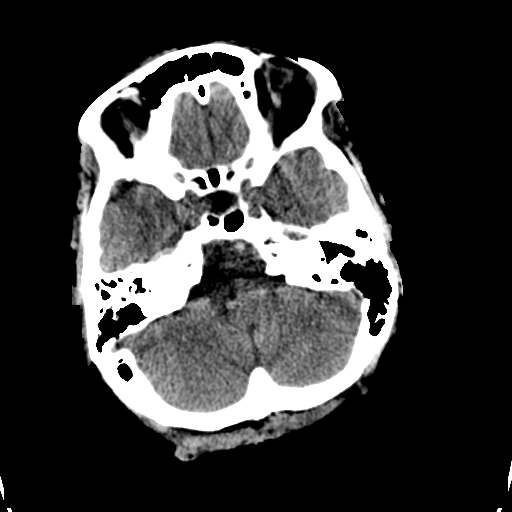
[im 9/36  brain]
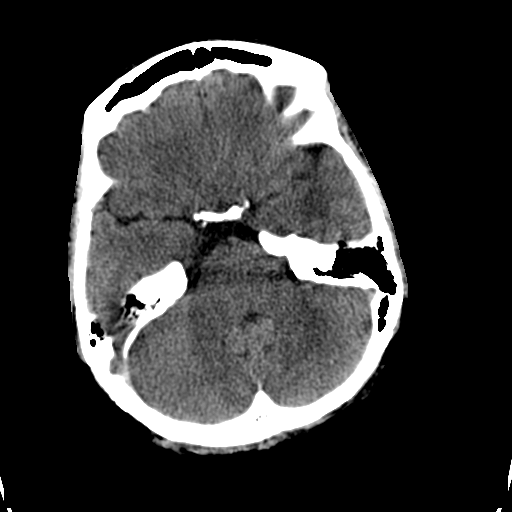
[im 10/36  brain]
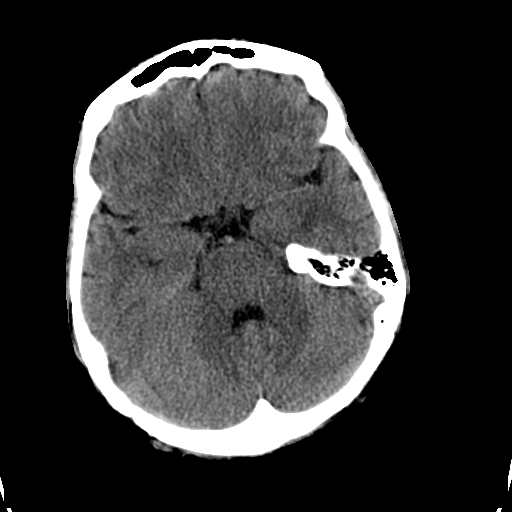
[im 10/36  bone]
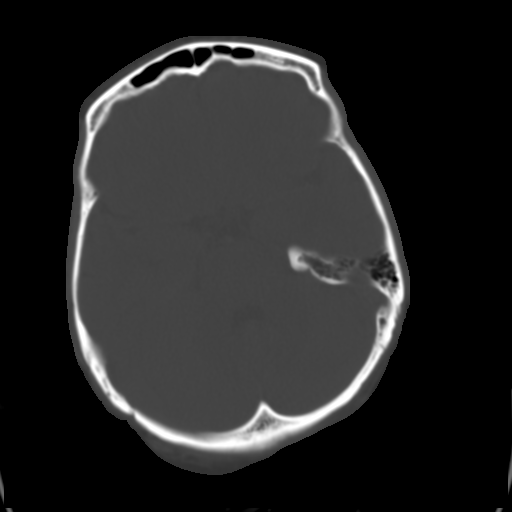
[im 13/36  brain]
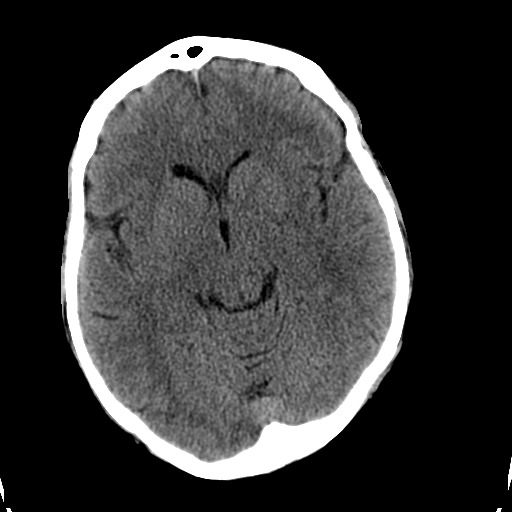
[im 15/36  brain]
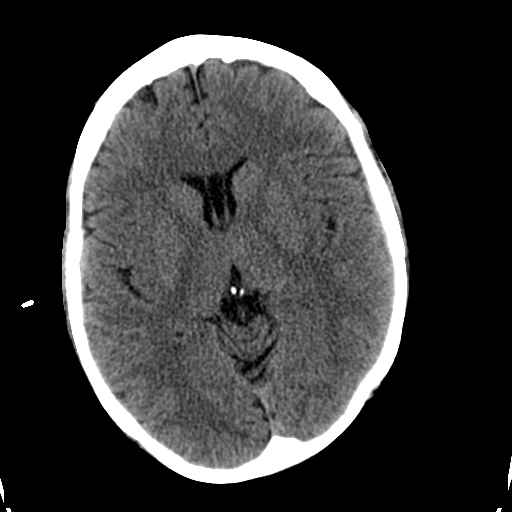
[im 17/36  brain]
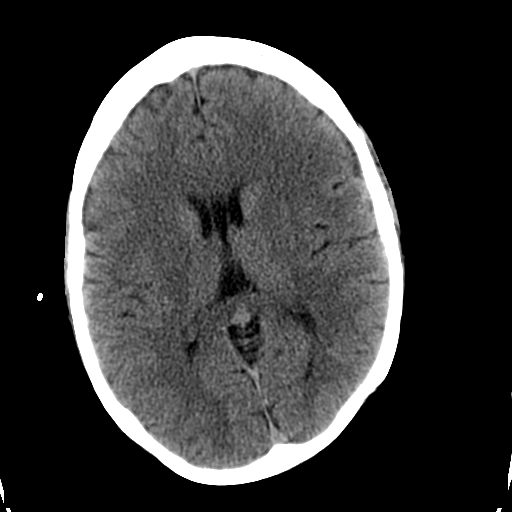
[im 19/36  brain]
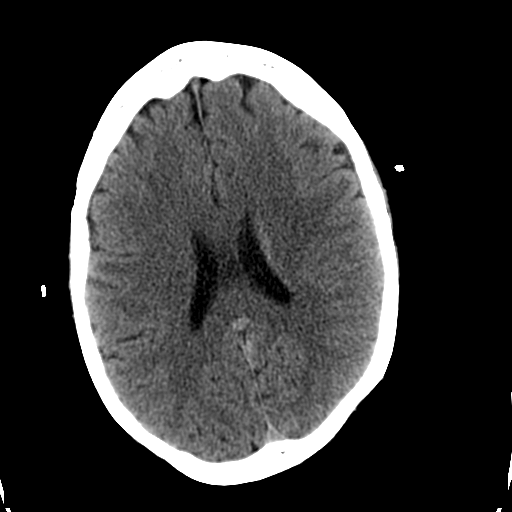
[im 19/36  bone]
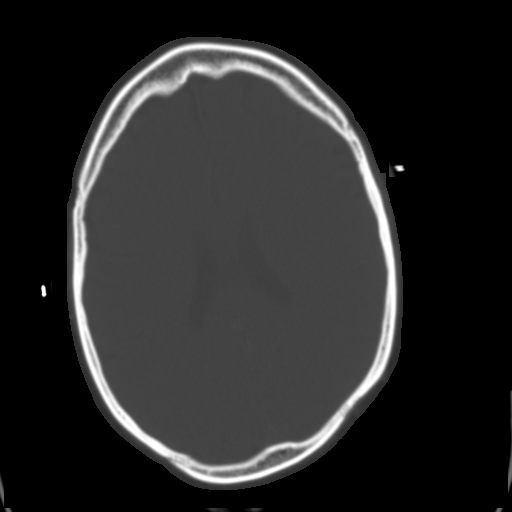
[im 21/36  brain]
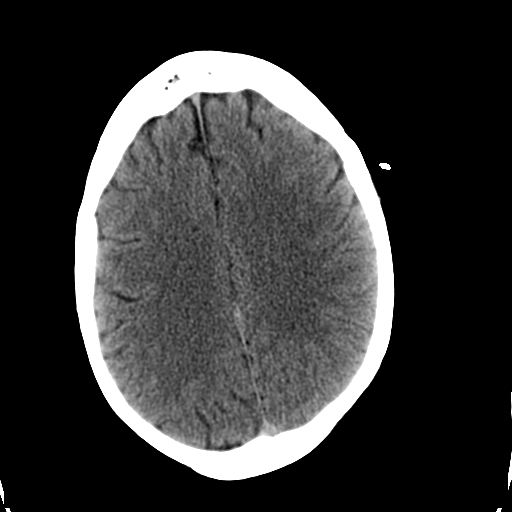
[im 23/36  brain]
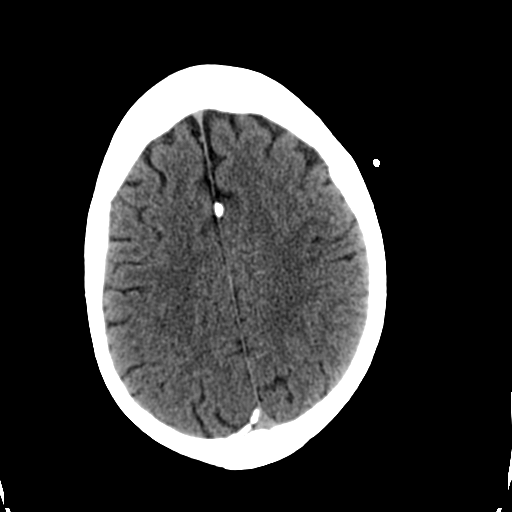
[im 26/36  brain]
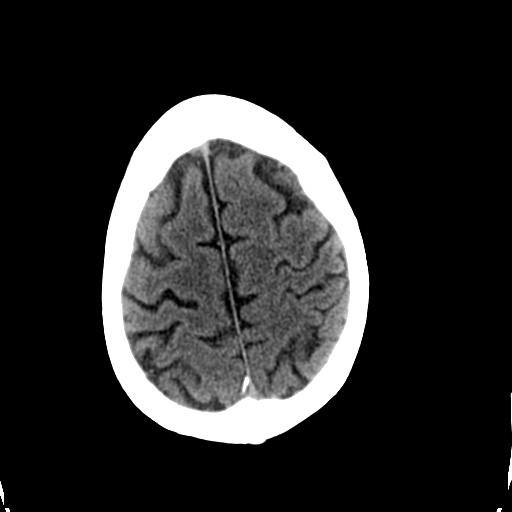
[im 27/36  brain]
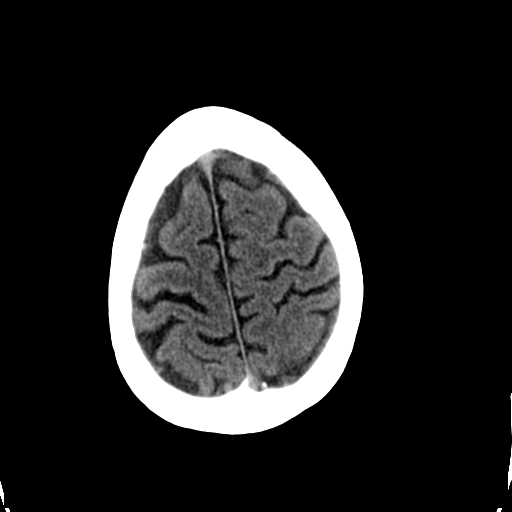
[im 27/36  bone]
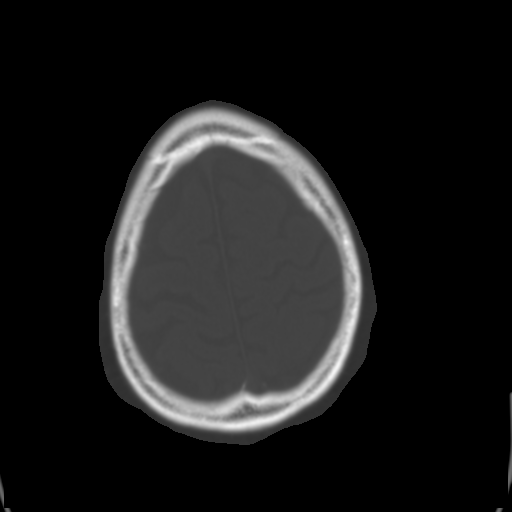
[im 29/36  brain]
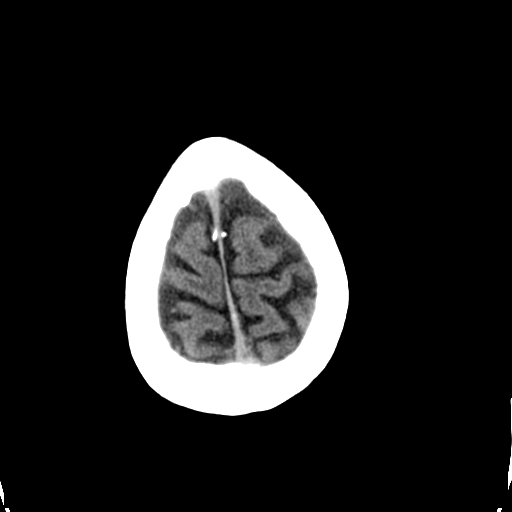
[im 32/36  brain]
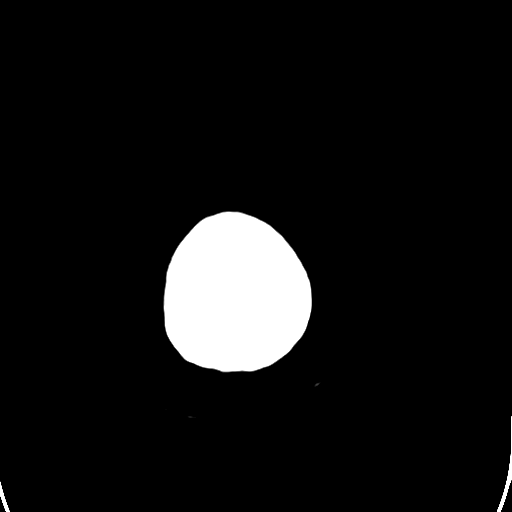
[im 34/36  brain]
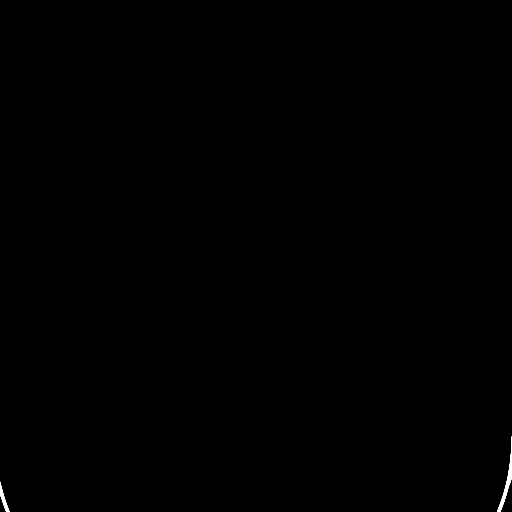

[16 of 30 positions shown; findings below may reference images not displayed]

FINDINGS: The brain has a normal appearance without evidence of
atrophy, infarction, mass lesion, hemorrhage, hydrocephalus or
extra-axial collection.  Calvarium is unremarkable.  The sinuses,
middle ears and mastoids are clear.
IMPRESSION: Normal examination

## 2008-03-24 ENCOUNTER — Ambulatory Visit (HOSPITAL_COMMUNITY): Payer: Self-pay | Admitting: Marriage and Family Therapist

## 2008-03-31 ENCOUNTER — Ambulatory Visit (HOSPITAL_COMMUNITY): Payer: Self-pay | Admitting: Marriage and Family Therapist

## 2008-04-04 ENCOUNTER — Ambulatory Visit: Payer: Self-pay | Admitting: Internal Medicine

## 2008-04-04 ENCOUNTER — Telehealth: Payer: Self-pay | Admitting: *Deleted

## 2008-04-04 ENCOUNTER — Encounter (INDEPENDENT_AMBULATORY_CARE_PROVIDER_SITE_OTHER): Payer: Self-pay | Admitting: Internal Medicine

## 2008-04-04 DIAGNOSIS — R630 Anorexia: Secondary | ICD-10-CM | POA: Insufficient documentation

## 2008-04-04 DIAGNOSIS — Z332 Encounter for elective termination of pregnancy: Secondary | ICD-10-CM | POA: Insufficient documentation

## 2008-04-04 LAB — CONVERTED CEMR LAB
ALT: 8 units/L (ref 0–35)
AST: 10 units/L (ref 0–37)
Albumin: 4.2 g/dL (ref 3.5–5.2)
BUN: 5 mg/dL — ABNORMAL LOW (ref 6–23)
Calcium: 8.7 mg/dL (ref 8.4–10.5)
Chloride: 110 meq/L (ref 96–112)
Hemoglobin: 12.5 g/dL (ref 12.0–15.0)
Potassium: 4.3 meq/L (ref 3.5–5.3)
RBC: 4.77 M/uL (ref 3.87–5.11)
RDW: 17.3 % — ABNORMAL HIGH (ref 11.5–15.5)
Sodium: 141 meq/L (ref 135–145)
Total Protein: 6.9 g/dL (ref 6.0–8.3)

## 2008-04-07 ENCOUNTER — Ambulatory Visit (HOSPITAL_COMMUNITY): Payer: Self-pay | Admitting: Marriage and Family Therapist

## 2008-04-14 ENCOUNTER — Ambulatory Visit (HOSPITAL_COMMUNITY): Payer: Self-pay | Admitting: Marriage and Family Therapist

## 2008-04-21 ENCOUNTER — Ambulatory Visit (HOSPITAL_COMMUNITY): Payer: Self-pay | Admitting: Marriage and Family Therapist

## 2008-04-28 ENCOUNTER — Ambulatory Visit (HOSPITAL_COMMUNITY): Payer: Self-pay | Admitting: Marriage and Family Therapist

## 2008-05-09 ENCOUNTER — Telehealth: Payer: Self-pay | Admitting: *Deleted

## 2008-05-11 ENCOUNTER — Ambulatory Visit (HOSPITAL_COMMUNITY): Payer: Self-pay | Admitting: Psychiatry

## 2008-05-11 ENCOUNTER — Telehealth: Payer: Self-pay | Admitting: Internal Medicine

## 2008-05-12 ENCOUNTER — Ambulatory Visit (HOSPITAL_COMMUNITY): Payer: Self-pay | Admitting: Marriage and Family Therapist

## 2008-05-17 ENCOUNTER — Ambulatory Visit (HOSPITAL_COMMUNITY): Payer: Self-pay | Admitting: Marriage and Family Therapist

## 2008-05-18 ENCOUNTER — Ambulatory Visit (HOSPITAL_COMMUNITY): Payer: Self-pay | Admitting: Marriage and Family Therapist

## 2008-05-25 ENCOUNTER — Ambulatory Visit: Payer: Self-pay | Admitting: Internal Medicine

## 2008-05-31 ENCOUNTER — Ambulatory Visit (HOSPITAL_COMMUNITY): Payer: Self-pay | Admitting: Marriage and Family Therapist

## 2008-06-02 ENCOUNTER — Ambulatory Visit (HOSPITAL_COMMUNITY): Payer: Self-pay | Admitting: Marriage and Family Therapist

## 2008-06-14 ENCOUNTER — Ambulatory Visit (HOSPITAL_COMMUNITY): Payer: Self-pay | Admitting: Marriage and Family Therapist

## 2008-06-16 ENCOUNTER — Ambulatory Visit (HOSPITAL_COMMUNITY): Payer: Self-pay | Admitting: Marriage and Family Therapist

## 2008-06-22 ENCOUNTER — Ambulatory Visit (HOSPITAL_COMMUNITY): Payer: Self-pay | Admitting: Marriage and Family Therapist

## 2008-06-23 ENCOUNTER — Ambulatory Visit: Payer: Self-pay | Admitting: Family Medicine

## 2008-06-23 ENCOUNTER — Encounter (INDEPENDENT_AMBULATORY_CARE_PROVIDER_SITE_OTHER): Payer: Self-pay | Admitting: Family Medicine

## 2008-06-23 ENCOUNTER — Other Ambulatory Visit: Admission: RE | Admit: 2008-06-23 | Discharge: 2008-06-23 | Payer: Self-pay | Admitting: Obstetrics & Gynecology

## 2008-06-24 ENCOUNTER — Encounter (INDEPENDENT_AMBULATORY_CARE_PROVIDER_SITE_OTHER): Payer: Self-pay | Admitting: Internal Medicine

## 2008-06-24 LAB — CONVERTED CEMR LAB
HCT: 36.6 % (ref 36.0–46.0)
MCHC: 33.3 g/dL (ref 30.0–36.0)
MCV: 82.4 fL (ref 78.0–100.0)
Platelets: 249 10*3/uL (ref 150–400)
RDW: 16.5 % — ABNORMAL HIGH (ref 11.5–15.5)
WBC: 7 10*3/uL (ref 4.0–10.5)

## 2008-06-26 ENCOUNTER — Ambulatory Visit: Payer: Self-pay | Admitting: Obstetrics and Gynecology

## 2008-06-26 ENCOUNTER — Inpatient Hospital Stay (HOSPITAL_COMMUNITY): Admission: AD | Admit: 2008-06-26 | Discharge: 2008-06-26 | Payer: Self-pay | Admitting: Obstetrics & Gynecology

## 2008-06-28 ENCOUNTER — Encounter (INDEPENDENT_AMBULATORY_CARE_PROVIDER_SITE_OTHER): Payer: Self-pay | Admitting: Internal Medicine

## 2008-06-29 ENCOUNTER — Ambulatory Visit (HOSPITAL_COMMUNITY): Admission: RE | Admit: 2008-06-29 | Discharge: 2008-06-29 | Payer: Self-pay | Admitting: Obstetrics and Gynecology

## 2008-06-29 IMAGING — US US PELVIS COMPLETE MODIFY
1 series · 14 of 25 positions shown · non-contrast
Comparison: None

CLINICAL DATA: Dysfunctional uterine bleeding

TRANSABDOMINAL AND TRANSVAGINAL ULTRASOUND OF PELVIS
TECHNIQUE: Both transabdominal and transvaginal ultrasound
examinations of the pelvis were performed including evaluation of
the uterus, ovaries, adnexal regions, and pelvic cul-de-sac.

[Series 1: us pelvis complete modify · 44 acquisitions, 14 frames shown]
[im 1/44]
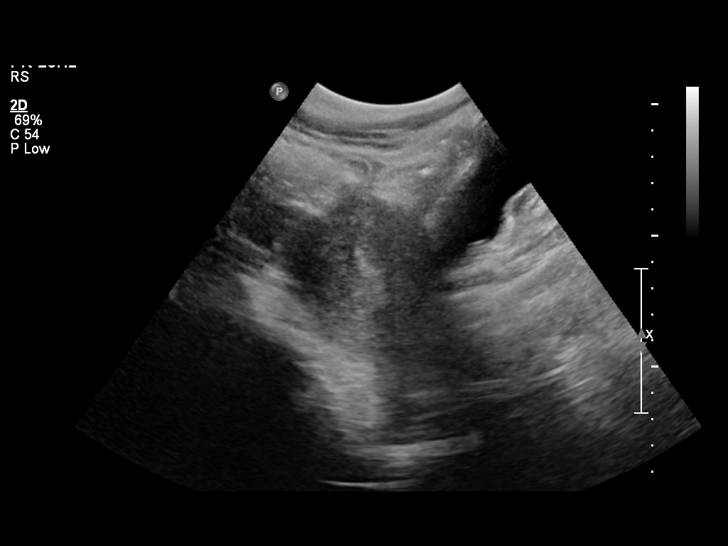
[im 4/44]
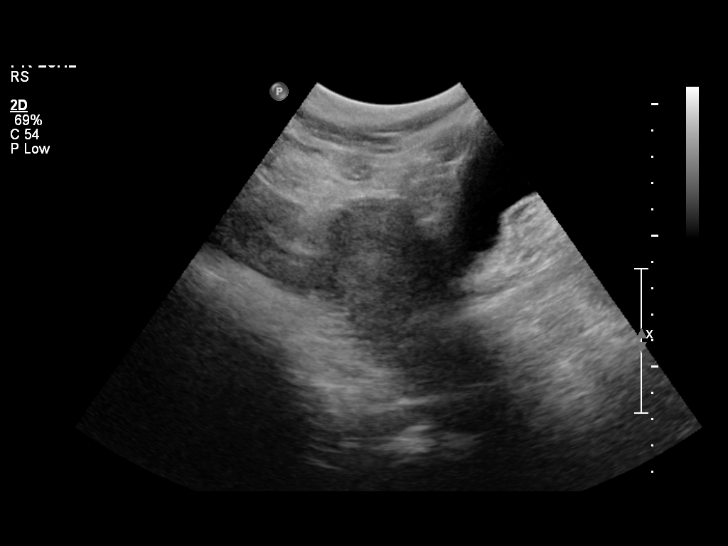
[im 8/44]
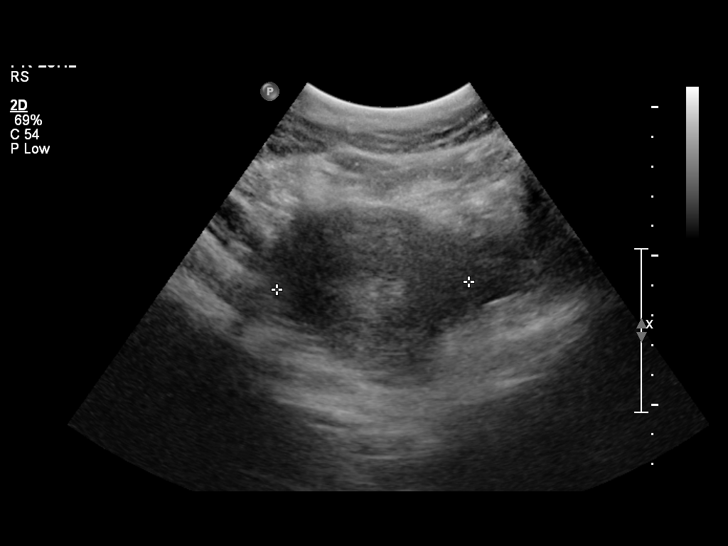
[im 11/44]
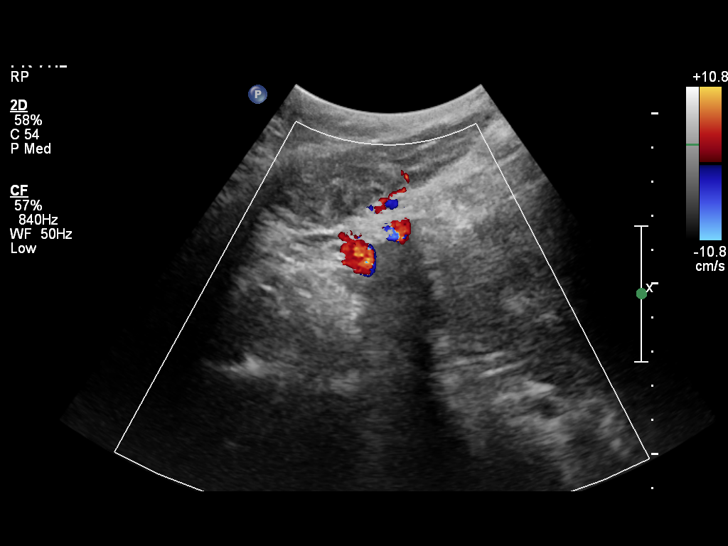
[im 15/44]
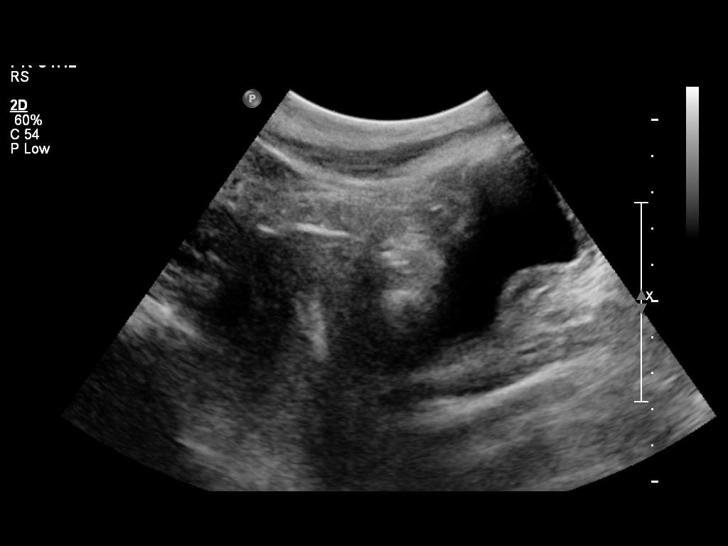
[im 17/44]
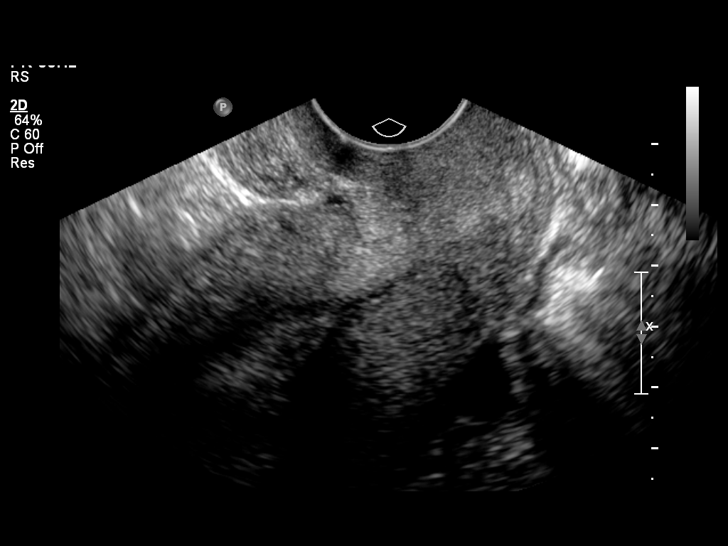
[im 20/44]
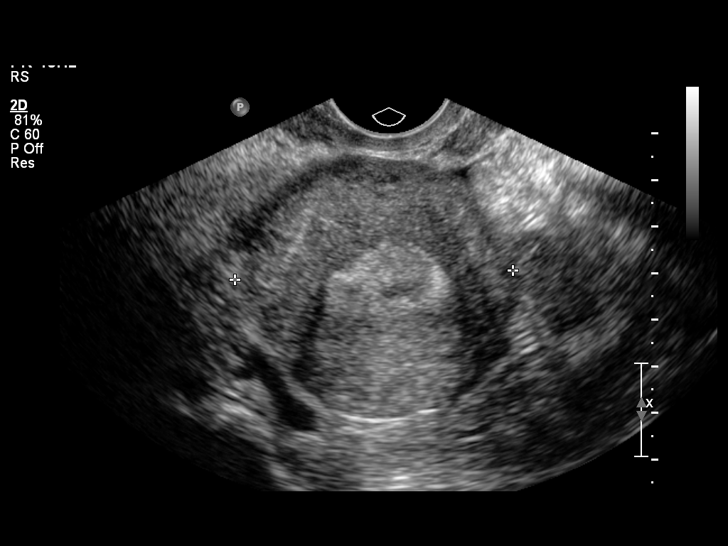
[im 24/44]
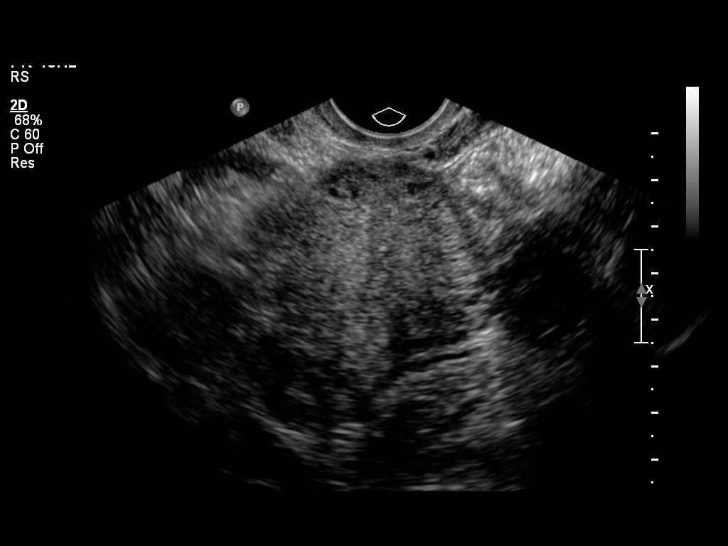
[im 27/44]
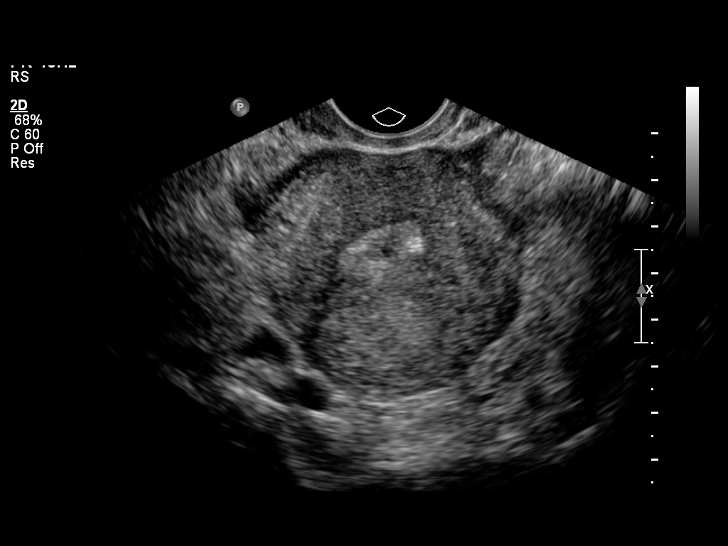
[im 29/44]
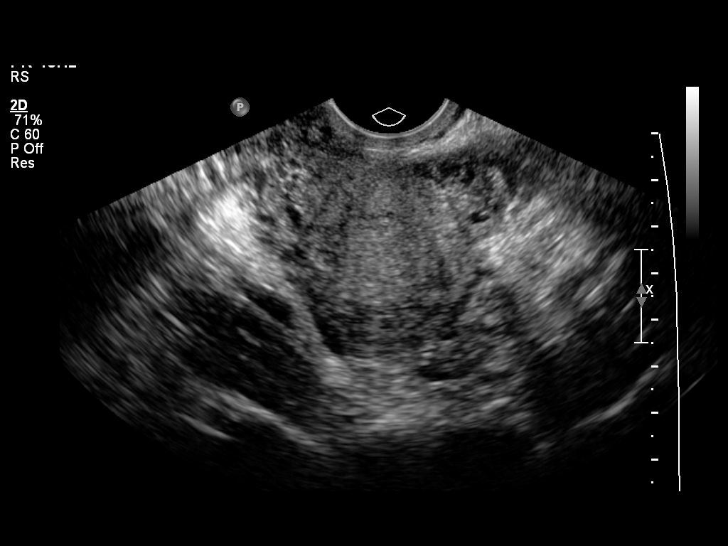
[im 33/44]
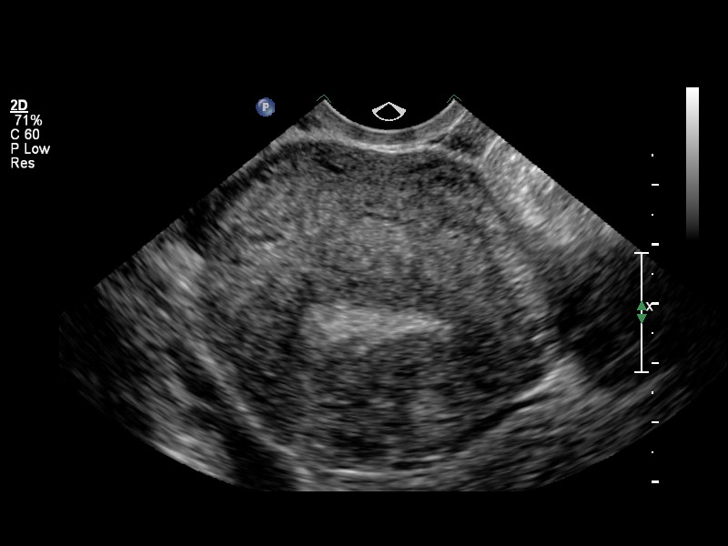
[im 36/44]
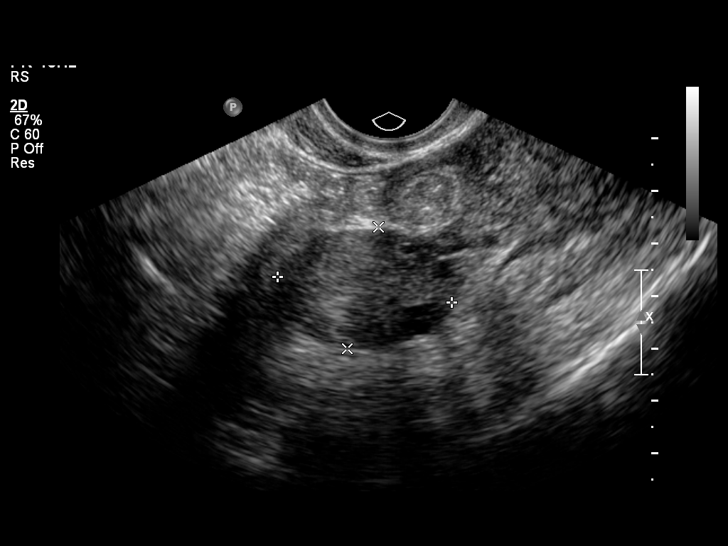
[im 40/44]
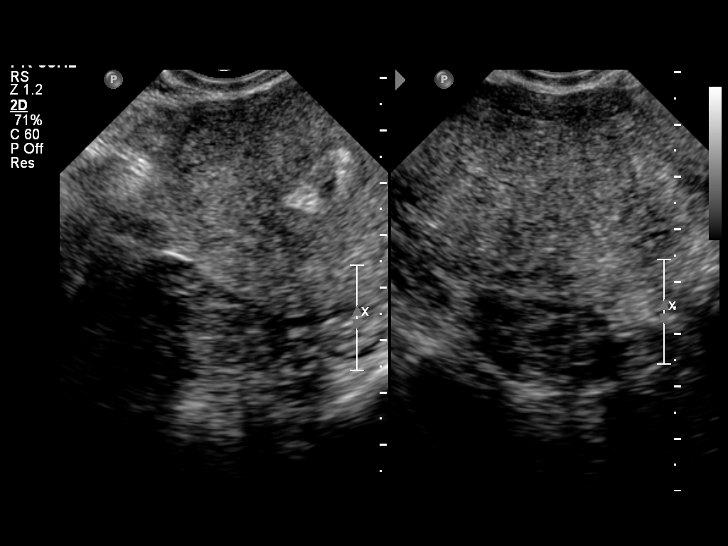
[im 44/44]
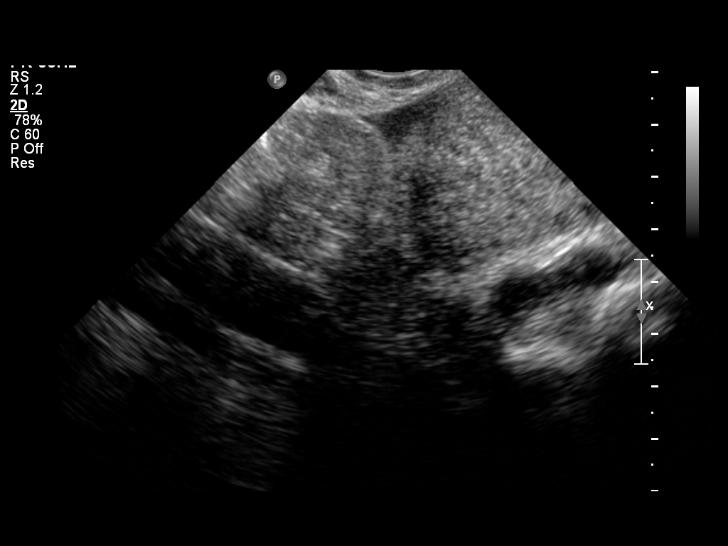

[14 of 25 positions shown; findings below may reference images not displayed]

FINDINGS: Uterus:  Anteverted, anteflexed, unremarkable

Endometrium:  9 mm, uniformly echogenic.  Trace endometrial fluid
incidentally noted.

Right Ovary:  Normal

Left Ovary:  Normal

Other Findings:  Small amount of free pelvic fluid incidentally
identified.
IMPRESSION: No acute intrapelvic finding.  No etiology is seen to explain the
history of dysfunctional uterine bleeding.  If symptoms continue,
sonohysterogram could be considered if there is suspicion for
submucosal fibroid or polyp.

## 2008-06-30 ENCOUNTER — Ambulatory Visit (HOSPITAL_COMMUNITY): Payer: Self-pay | Admitting: Marriage and Family Therapist

## 2008-07-07 ENCOUNTER — Ambulatory Visit (HOSPITAL_COMMUNITY): Payer: Self-pay | Admitting: Marriage and Family Therapist

## 2008-07-14 ENCOUNTER — Ambulatory Visit (HOSPITAL_COMMUNITY): Payer: Self-pay | Admitting: Marriage and Family Therapist

## 2008-07-15 IMAGING — CR DG FOOT COMPLETE 3+V*R*
3 series · 3 of 3 positions shown · non-contrast
Comparison: None.

CLINICAL DATA: Fell - pain and third through fifth toes

RIGHT FOOT COMPLETE - 3+ VIEW

[t foot lat right]
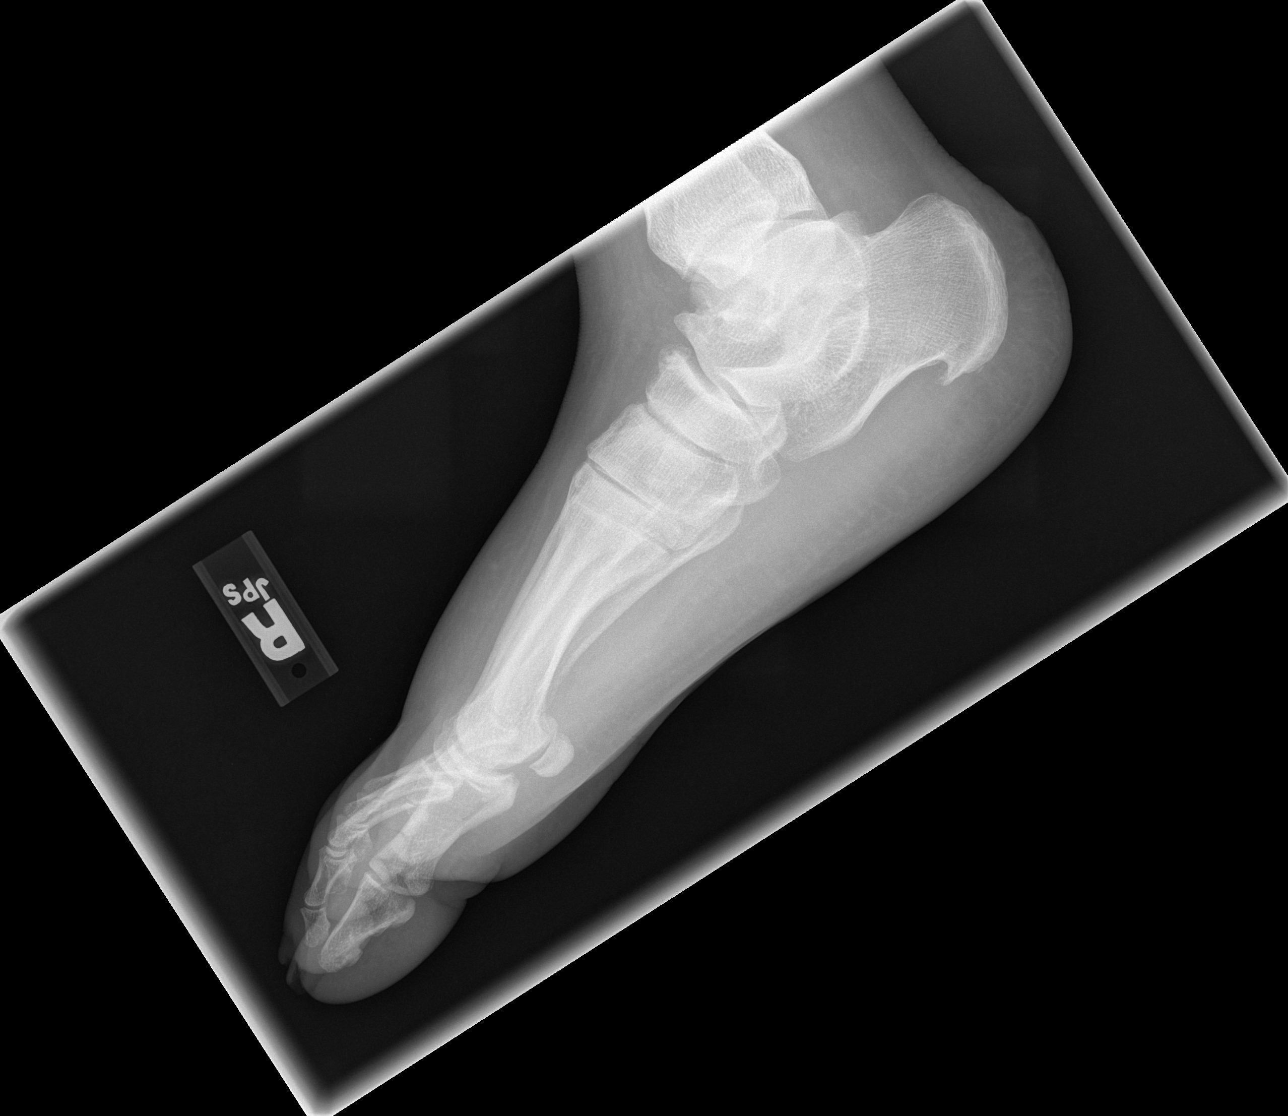

[t foot ap right]
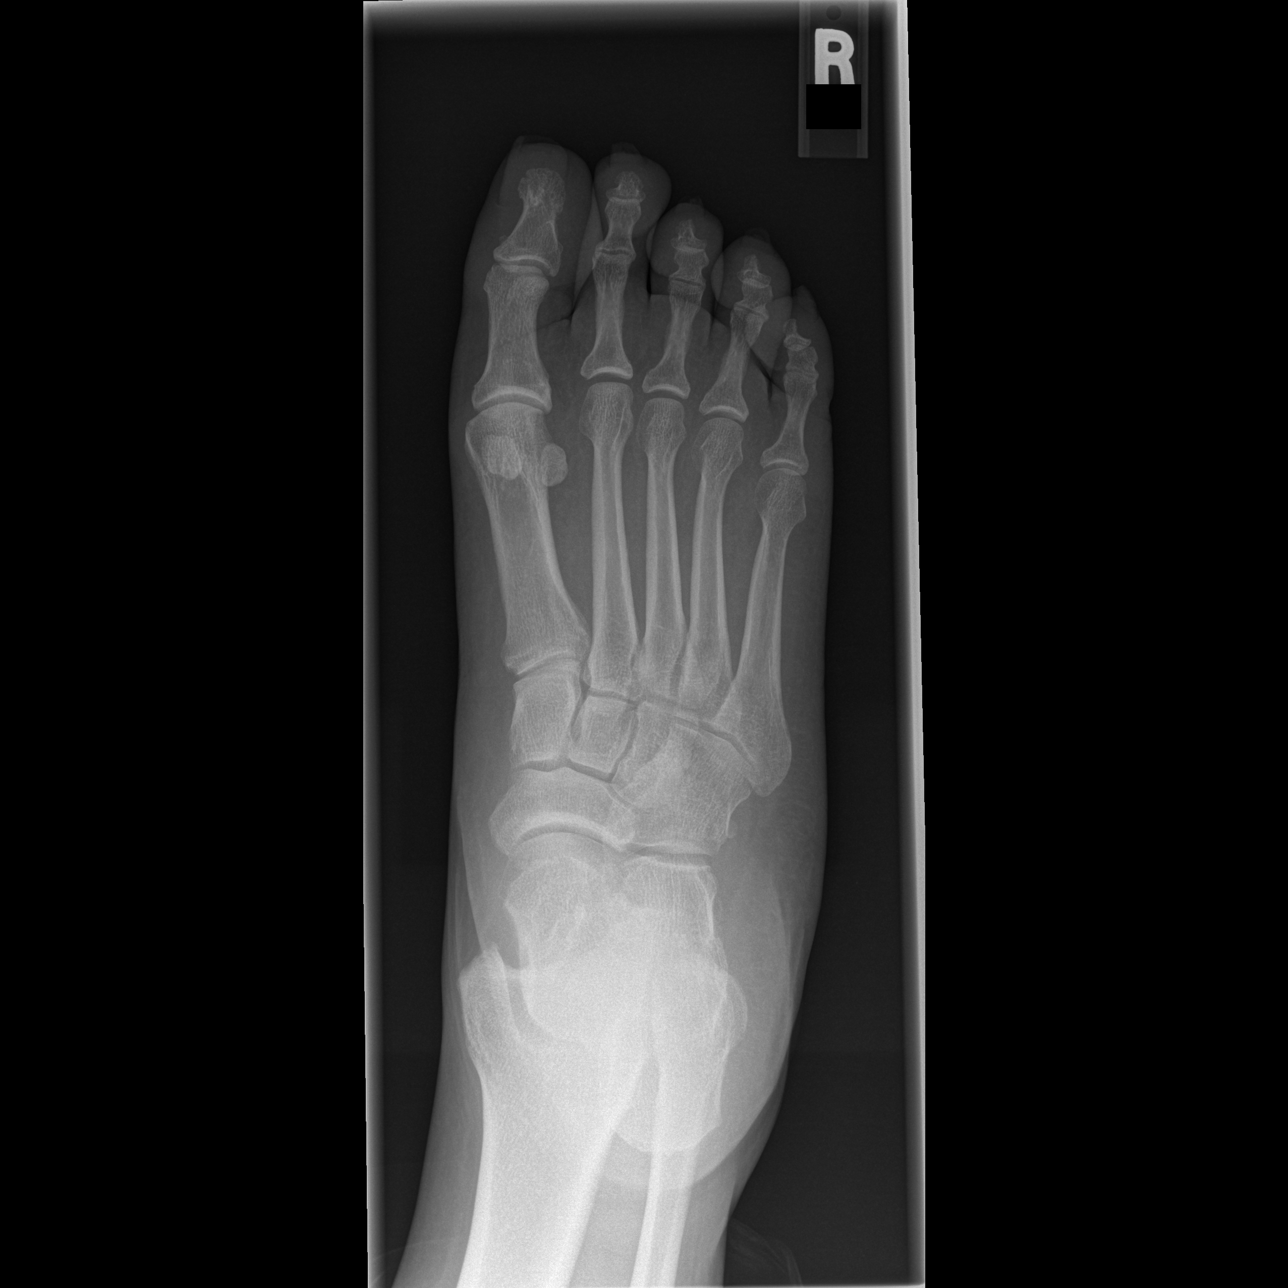

[t foot oblique right]
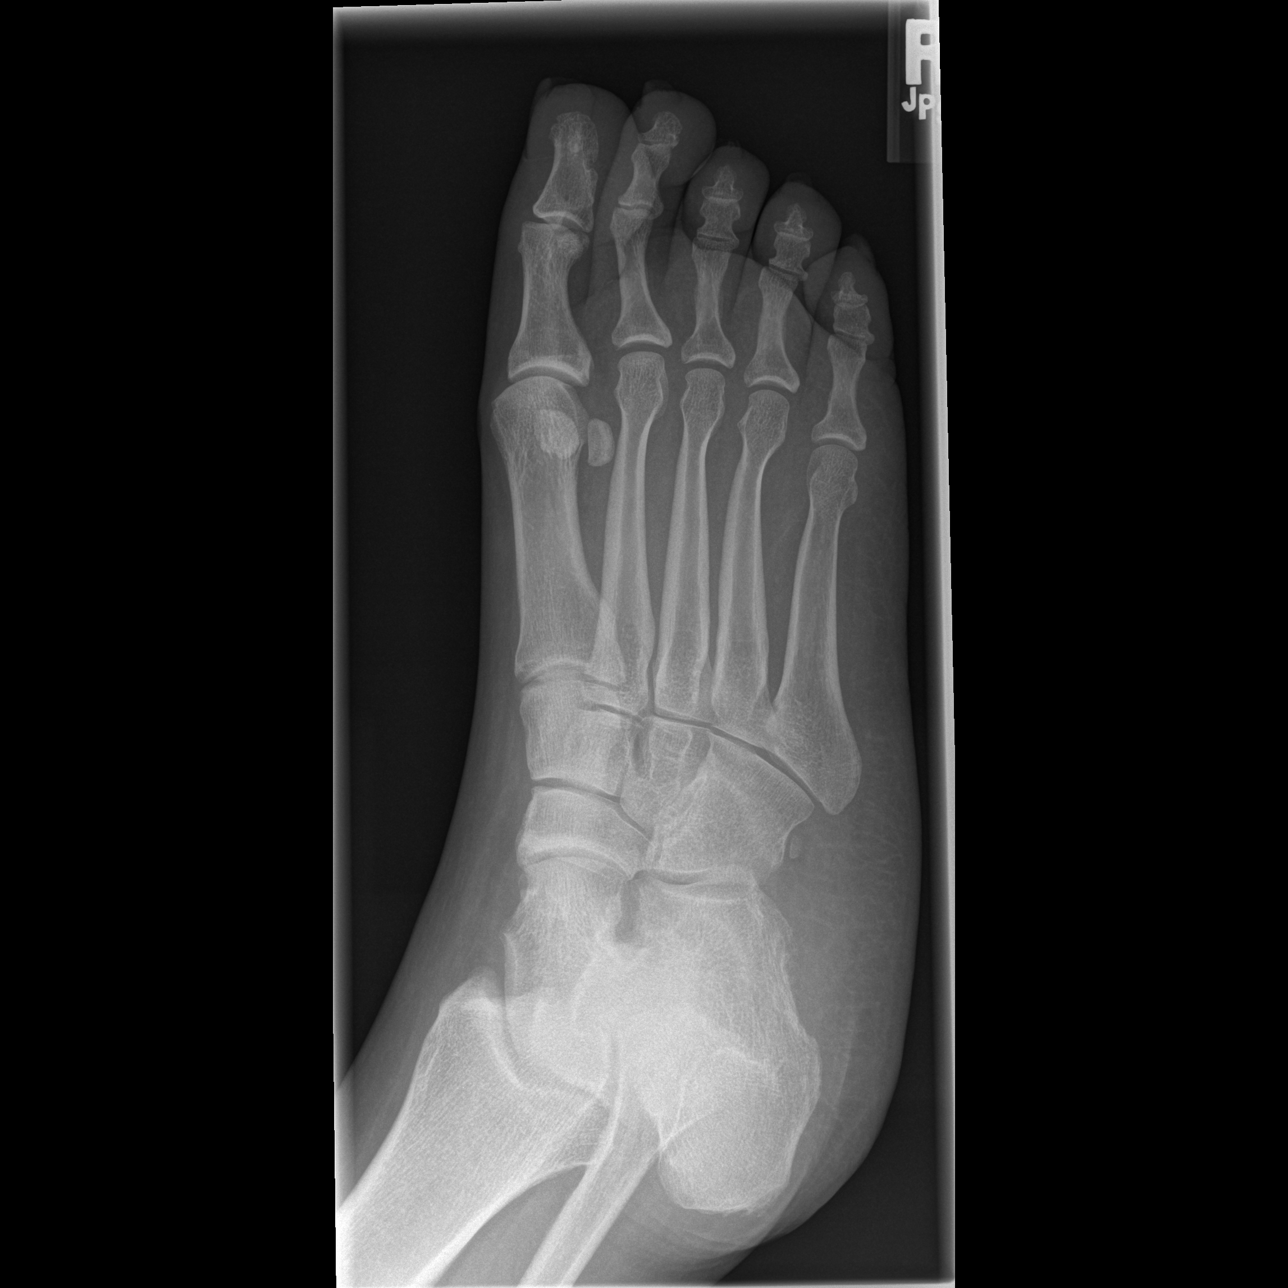

[3 of 3 positions shown; findings below may reference images not displayed]

FINDINGS: No fracture or dislocation noted.  Prominent calcaneal
spur present.
IMPRESSION: No acute findings.

## 2008-07-15 IMAGING — CR DG ANKLE COMPLETE 3+V*R*
3 series · 3 of 3 positions shown · non-contrast
Comparison: None.

CLINICAL DATA: Right foot pain

RIGHT ANKLE - COMPLETE 3+ VIEW

[t ankle joint ap right]
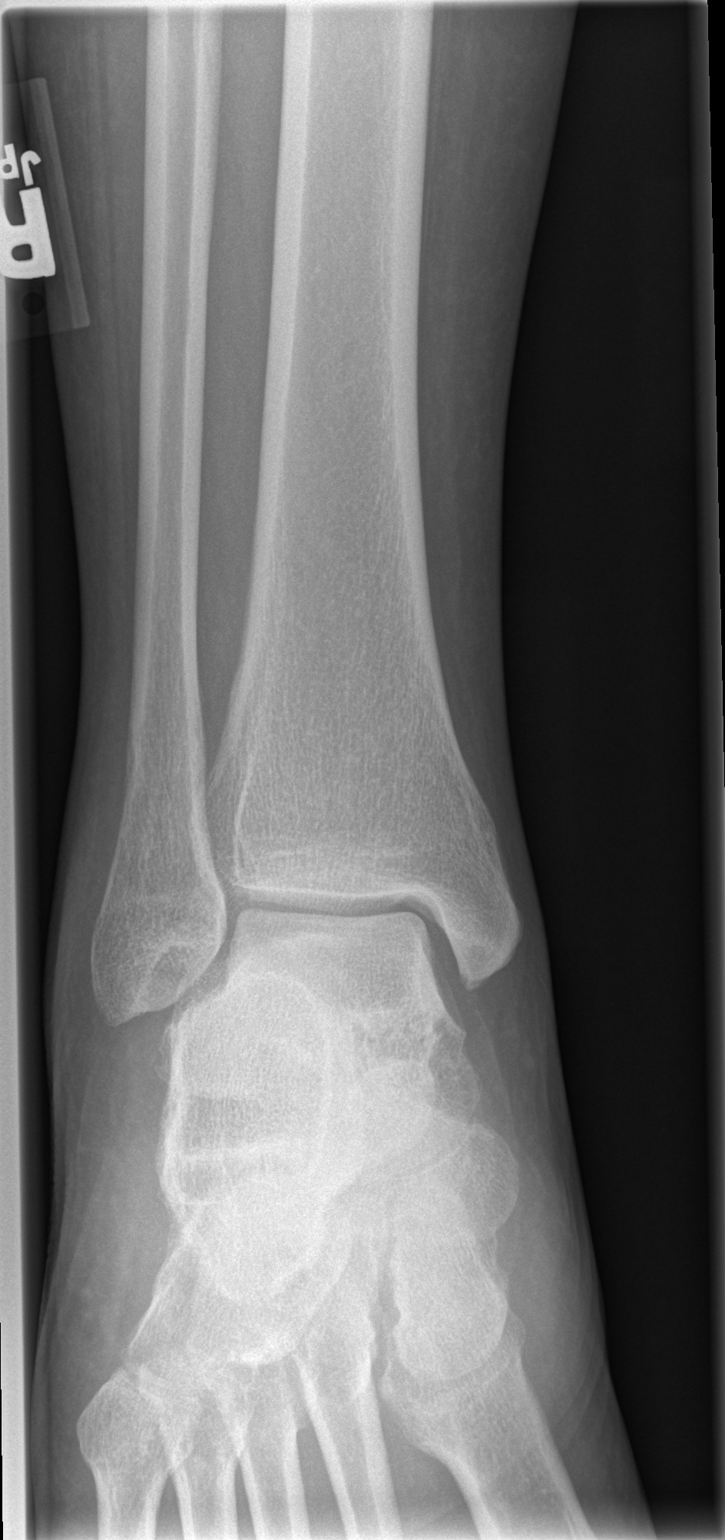

[t ankle joint oblique right]
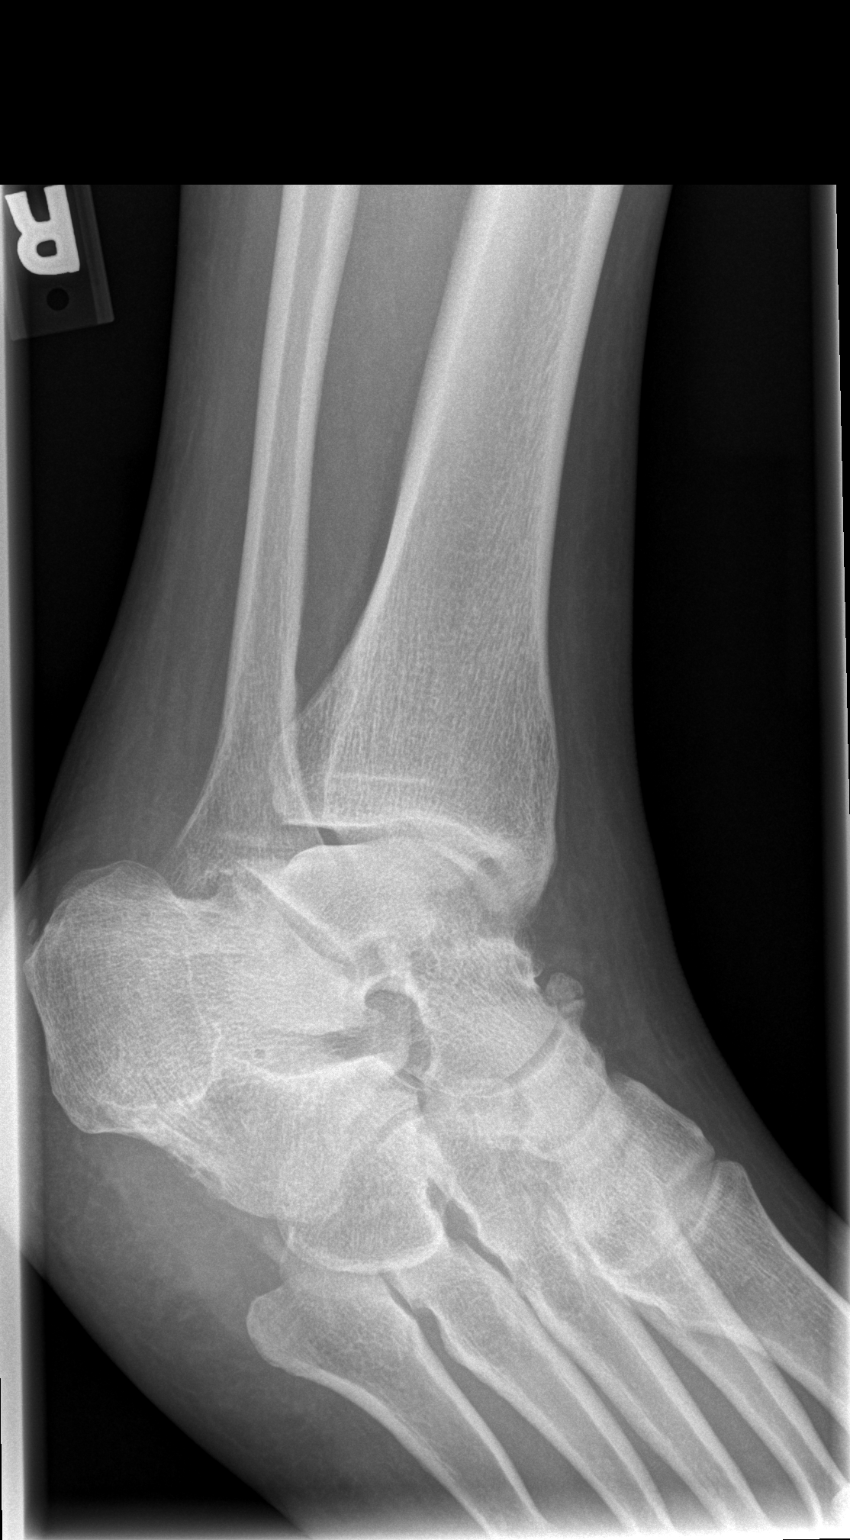

[t ankle joint lat right]
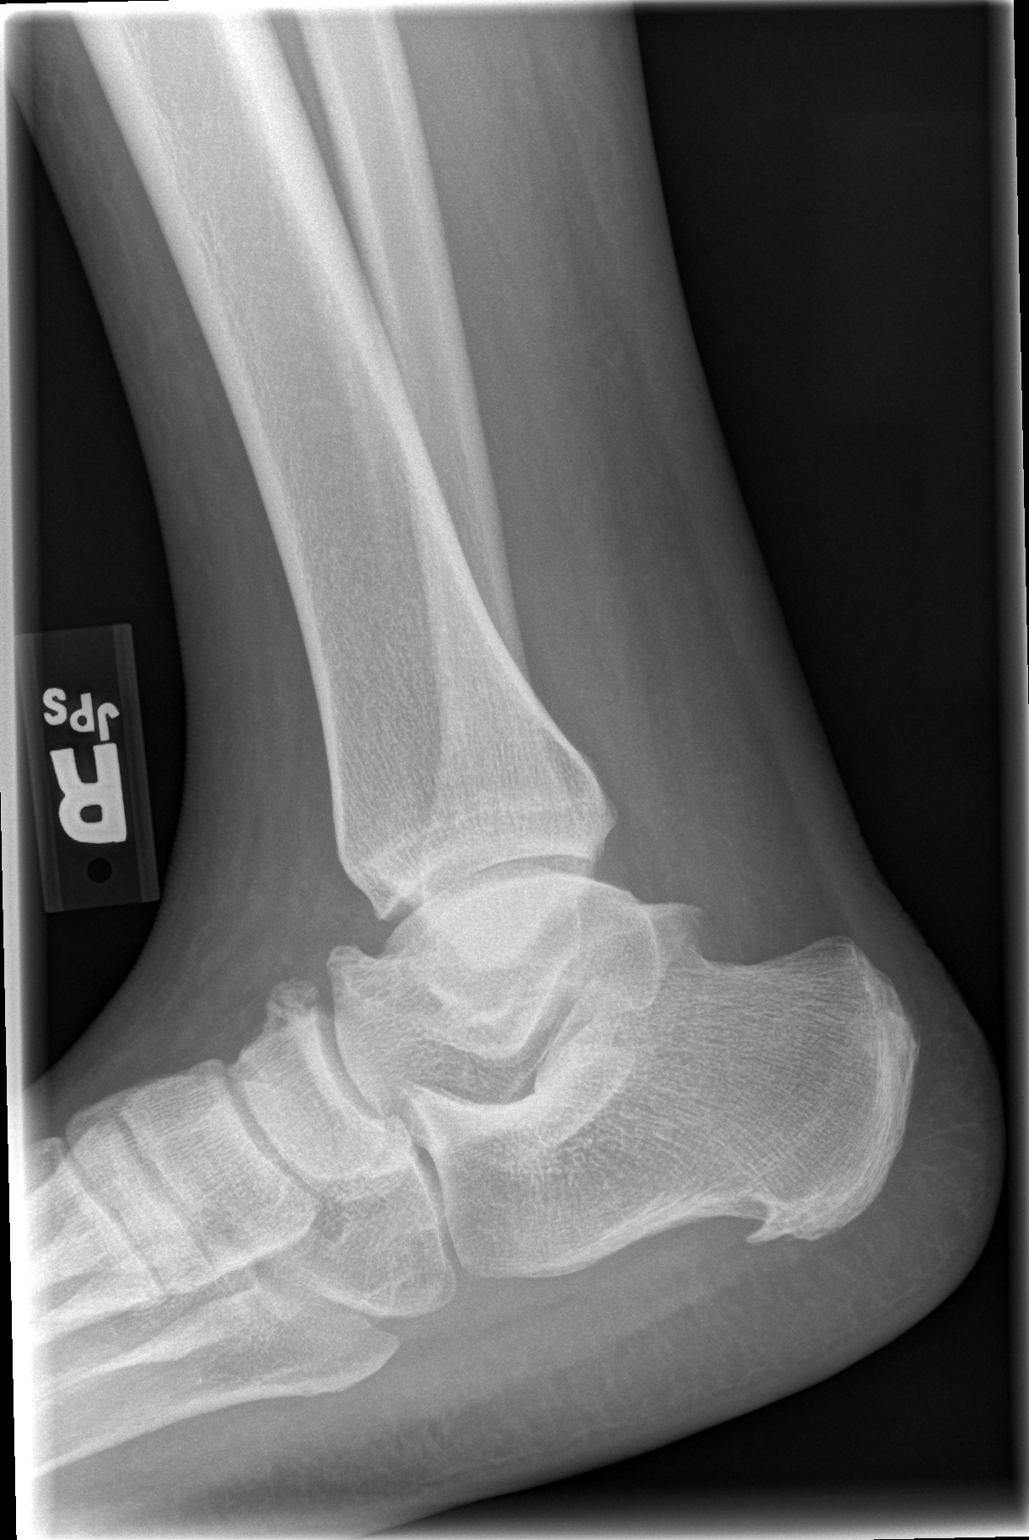

[3 of 3 positions shown; findings below may reference images not displayed]

FINDINGS: Three views of the right ankle submitted.  No acute
fracture or subluxation.  Ankle mortise is preserved. Plantar
spurring of the calcaneus is noted.
IMPRESSION: No acute fracture or subluxation. Plantar spur of the calcaneus.

## 2008-08-09 ENCOUNTER — Ambulatory Visit (HOSPITAL_COMMUNITY): Payer: Self-pay | Admitting: Psychiatry

## 2008-08-11 ENCOUNTER — Ambulatory Visit (HOSPITAL_COMMUNITY): Payer: Self-pay | Admitting: Marriage and Family Therapist

## 2008-08-18 ENCOUNTER — Ambulatory Visit (HOSPITAL_COMMUNITY): Payer: Self-pay | Admitting: Marriage and Family Therapist

## 2008-08-22 ENCOUNTER — Ambulatory Visit: Payer: Self-pay | Admitting: Internal Medicine

## 2008-08-25 ENCOUNTER — Ambulatory Visit (HOSPITAL_COMMUNITY): Payer: Self-pay | Admitting: Marriage and Family Therapist

## 2008-08-31 ENCOUNTER — Encounter (INDEPENDENT_AMBULATORY_CARE_PROVIDER_SITE_OTHER): Payer: Self-pay | Admitting: Internal Medicine

## 2008-09-01 ENCOUNTER — Emergency Department (HOSPITAL_COMMUNITY): Admission: EM | Admit: 2008-09-01 | Discharge: 2008-09-01 | Payer: Self-pay | Admitting: Emergency Medicine

## 2008-09-06 ENCOUNTER — Ambulatory Visit: Payer: Self-pay | Admitting: Internal Medicine

## 2008-09-06 ENCOUNTER — Telehealth: Payer: Self-pay | Admitting: *Deleted

## 2008-09-06 ENCOUNTER — Encounter (INDEPENDENT_AMBULATORY_CARE_PROVIDER_SITE_OTHER): Payer: Self-pay | Admitting: Internal Medicine

## 2008-09-06 DIAGNOSIS — M545 Low back pain: Secondary | ICD-10-CM

## 2008-09-06 DIAGNOSIS — R609 Edema, unspecified: Secondary | ICD-10-CM

## 2008-09-06 DIAGNOSIS — R109 Unspecified abdominal pain: Secondary | ICD-10-CM

## 2008-09-06 LAB — CONVERTED CEMR LAB
ALT: 9 units/L (ref 0–35)
Alkaline Phosphatase: 47 units/L (ref 39–117)
Basophils Absolute: 0 10*3/uL (ref 0.0–0.1)
Basophils Relative: 1 % (ref 0–1)
Bilirubin Urine: NEGATIVE
Bilirubin Urine: NEGATIVE
Chlamydia, Swab/Urine, PCR: NEGATIVE
Creatinine, Ser: 0.69 mg/dL (ref 0.40–1.20)
Eosinophils Relative: 1 % (ref 0–5)
HCT: 38.8 % (ref 36.0–46.0)
Hemoglobin: 12.6 g/dL (ref 12.0–15.0)
Leukocytes, UA: NEGATIVE
Lymphocytes Relative: 37 % (ref 12–46)
MCHC: 32.5 g/dL (ref 30.0–36.0)
Monocytes Absolute: 0.8 10*3/uL (ref 0.1–1.0)
Monocytes Relative: 12 % (ref 3–12)
Neutro Abs: 3.3 10*3/uL (ref 1.7–7.7)
RBC: 4.59 M/uL (ref 3.87–5.11)
RDW: 17.2 % — ABNORMAL HIGH (ref 11.5–15.5)
Sodium: 140 meq/L (ref 135–145)
Specific Gravity, Urine: 1.015
Total Bilirubin: 0.4 mg/dL (ref 0.3–1.2)
Total Protein: 7.1 g/dL (ref 6.0–8.3)
Urine Glucose: NEGATIVE mg/dL
Urobilinogen, UA: 0.2
WBC Urine, dipstick: NEGATIVE
pH: 5.5
pH: 6 (ref 5.0–8.0)

## 2008-09-07 ENCOUNTER — Encounter (INDEPENDENT_AMBULATORY_CARE_PROVIDER_SITE_OTHER): Payer: Self-pay | Admitting: Internal Medicine

## 2008-09-07 ENCOUNTER — Ambulatory Visit (HOSPITAL_COMMUNITY): Payer: Self-pay | Admitting: Marriage and Family Therapist

## 2008-09-08 ENCOUNTER — Telehealth (INDEPENDENT_AMBULATORY_CARE_PROVIDER_SITE_OTHER): Payer: Self-pay | Admitting: *Deleted

## 2008-09-13 ENCOUNTER — Ambulatory Visit (HOSPITAL_COMMUNITY): Admission: RE | Admit: 2008-09-13 | Discharge: 2008-09-13 | Payer: Self-pay | Admitting: Internal Medicine

## 2008-09-13 IMAGING — US US RENAL
1 series · 14 of 25 positions shown · non-contrast
Comparison: [DATE]

CLINICAL DATA: Pelvic and flank pain

RENAL/URINARY TRACT ULTRASOUND COMPLETE

[Series 1: us renal · 0.30mm/px · 14 of 51 slices shown]
[im 1/51]
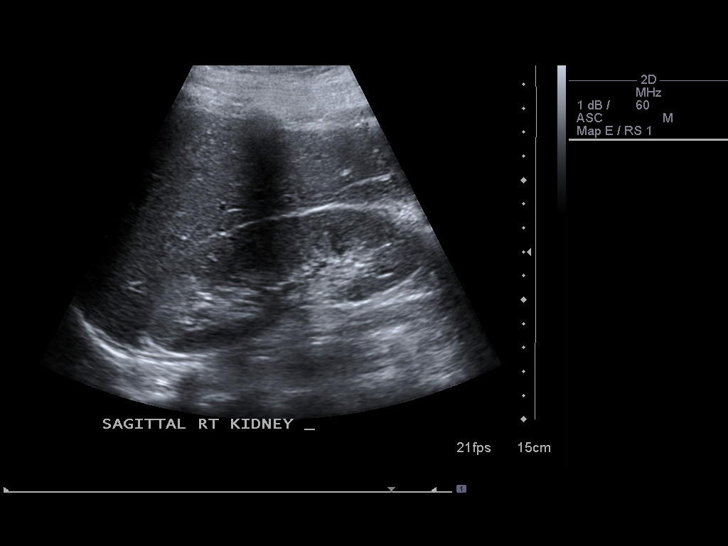
[im 5/51]
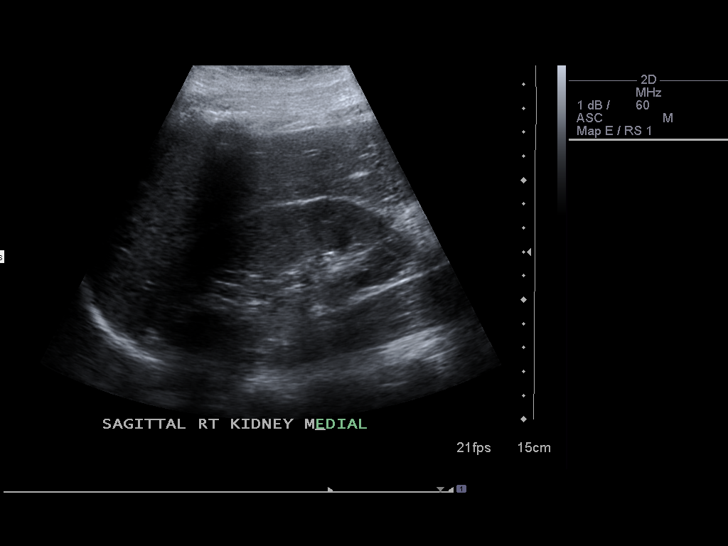
[im 9/51]
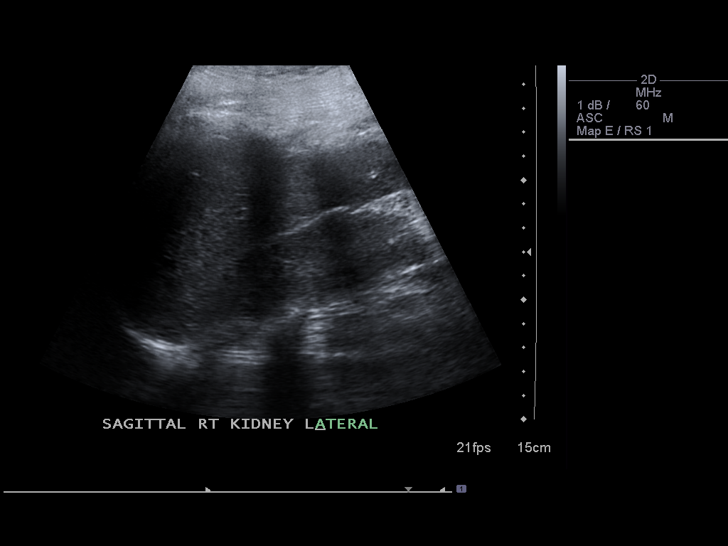
[im 13/51]
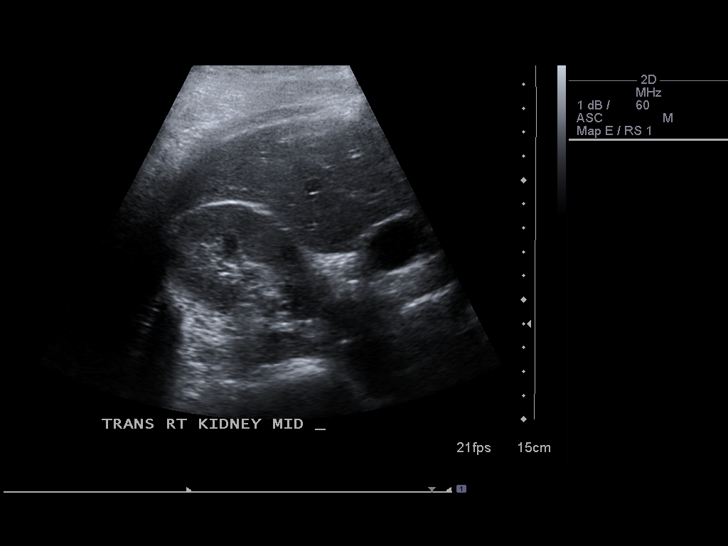
[im 17/51]
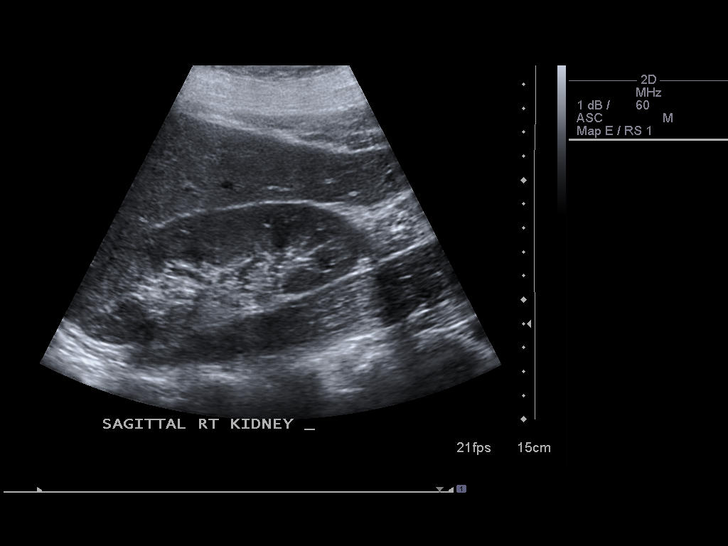
[im 19/51]
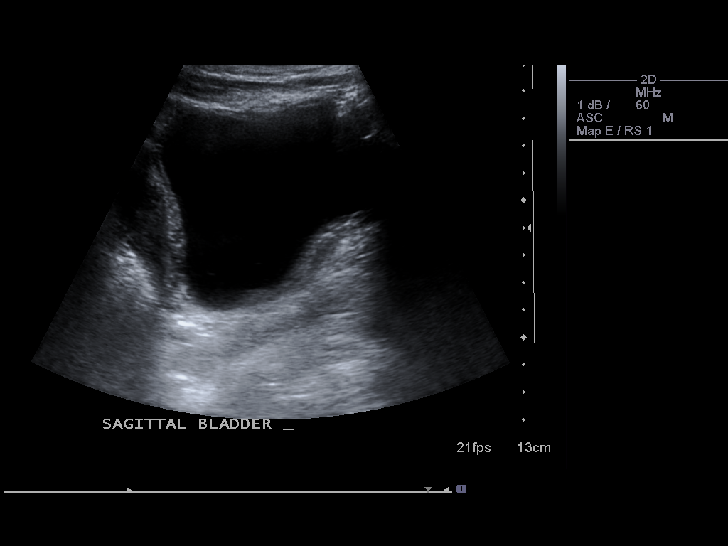
[im 23/51]
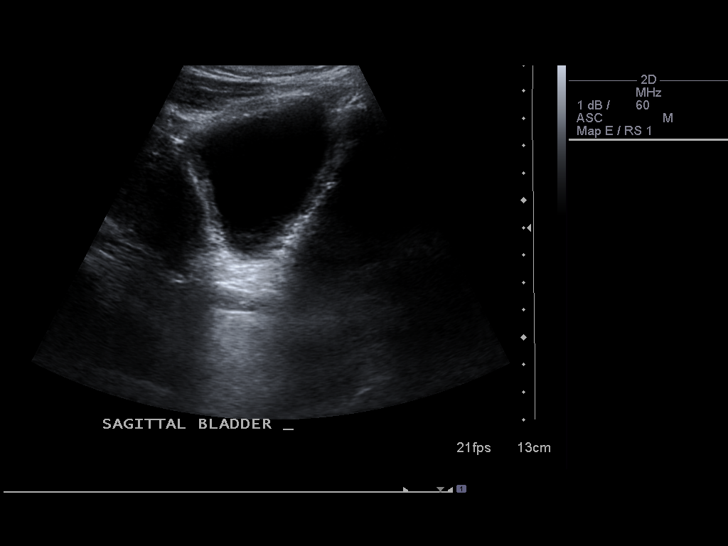
[im 28/51]
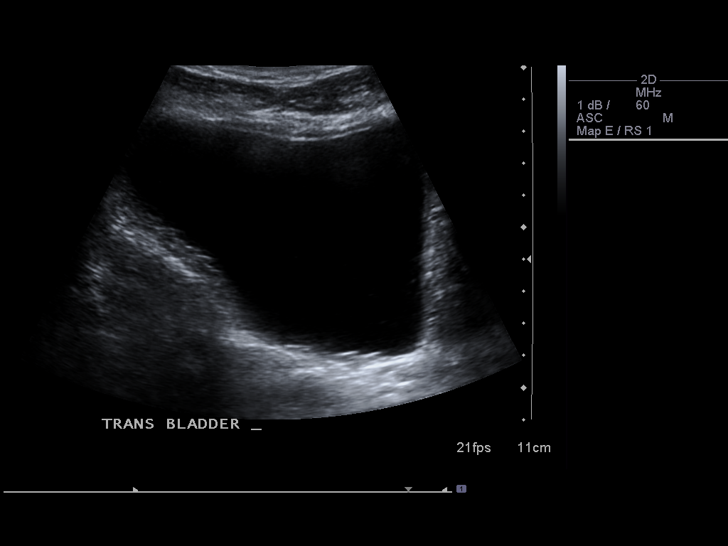
[im 32/51]
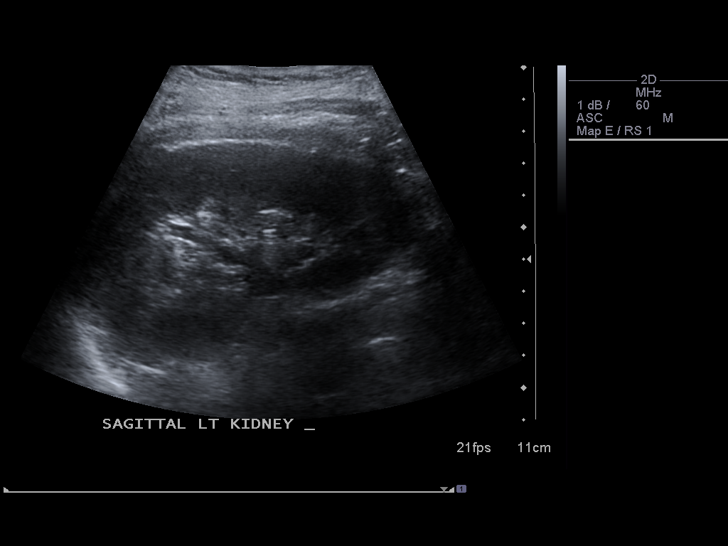
[im 34/51]
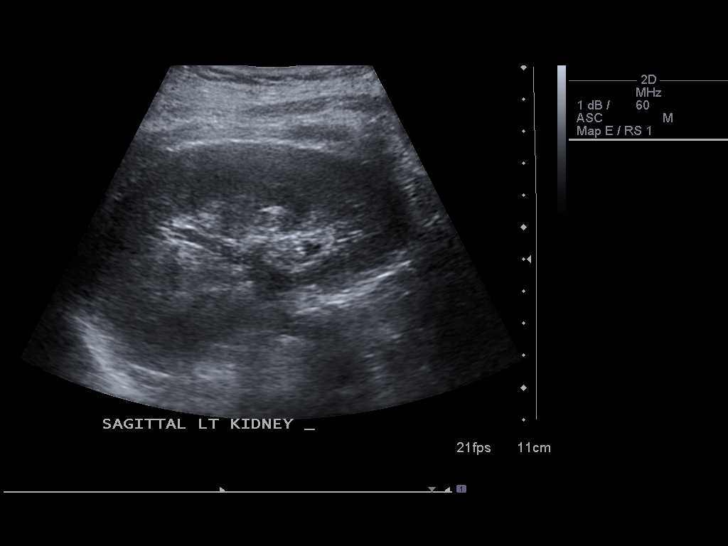
[im 38/51]
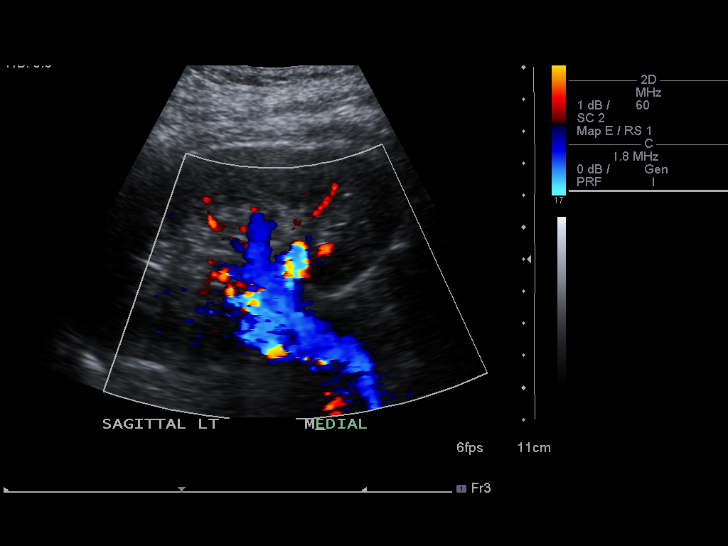
[im 42/51]
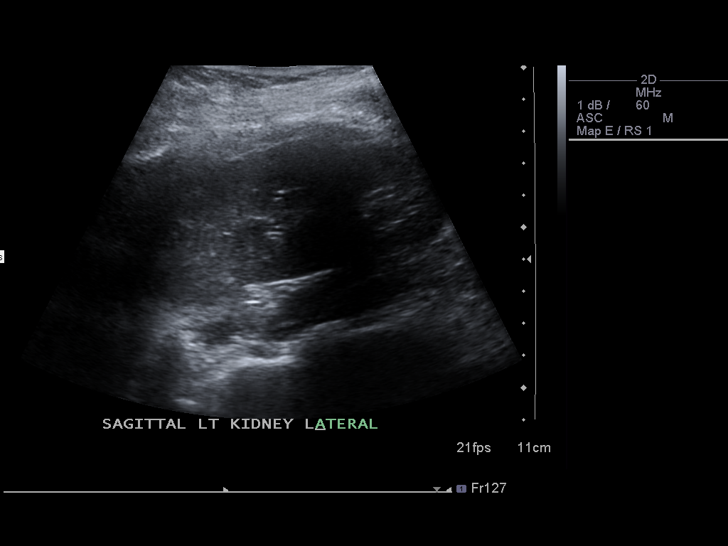
[im 46/51]
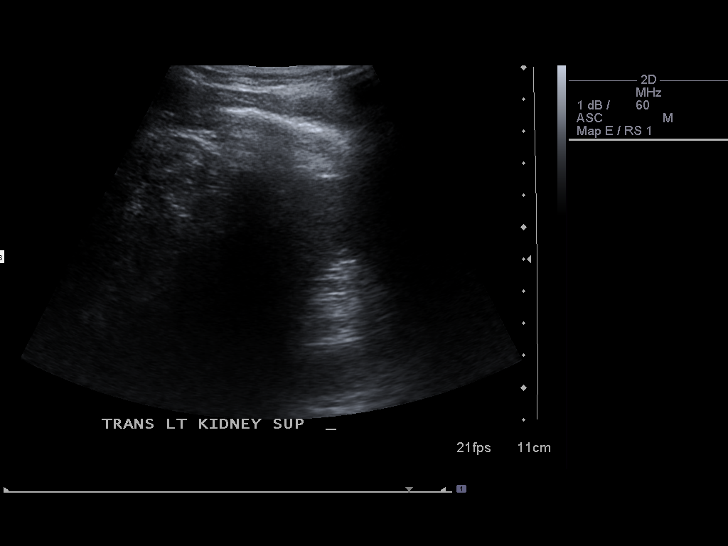
[im 51/51]
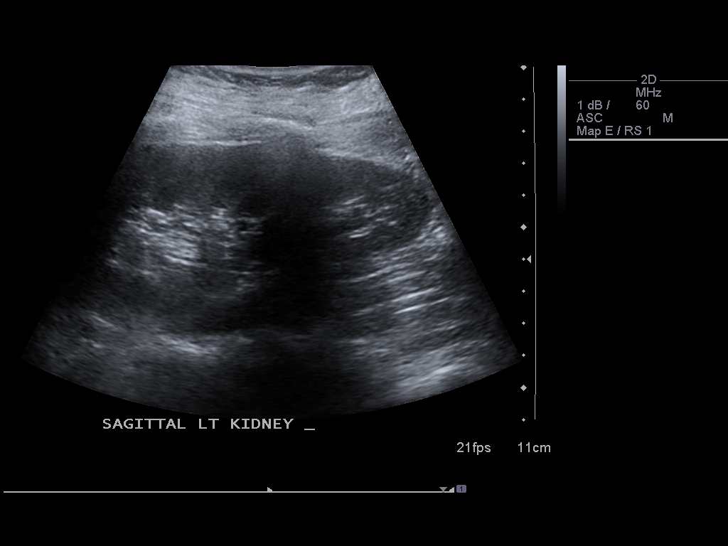

[14 of 25 positions shown; findings below may reference images not displayed]

FINDINGS: Right Kidney:  Normal in size and parenchymal echogenicity.  No
evidence of mass or hydronephrosis.

Left Kidney:  Normal in size and parenchymal echogenicity.  No
evidence of mass or hydronephrosis.

Bladder:  There is moderate, diffuse bladder wall thickening.
IMPRESSION: Normal kidneys.

There is moderate, diffuse bladder wall thickening which can be
seen with cystitis.

## 2008-09-15 ENCOUNTER — Ambulatory Visit (HOSPITAL_COMMUNITY): Payer: Self-pay | Admitting: Marriage and Family Therapist

## 2008-09-29 ENCOUNTER — Ambulatory Visit (HOSPITAL_COMMUNITY): Payer: Self-pay | Admitting: Marriage and Family Therapist

## 2008-10-06 ENCOUNTER — Ambulatory Visit (HOSPITAL_COMMUNITY): Payer: Self-pay | Admitting: Marriage and Family Therapist

## 2008-10-13 ENCOUNTER — Ambulatory Visit (HOSPITAL_COMMUNITY): Payer: Self-pay | Admitting: Marriage and Family Therapist

## 2008-10-18 ENCOUNTER — Ambulatory Visit: Payer: Self-pay | Admitting: Internal Medicine

## 2008-10-18 DIAGNOSIS — J387 Other diseases of larynx: Secondary | ICD-10-CM

## 2008-10-18 LAB — CONVERTED CEMR LAB
Anti Nuclear Antibody(ANA): NEGATIVE
Sed Rate: 2 mm/hr (ref 0–22)

## 2008-10-20 ENCOUNTER — Ambulatory Visit (HOSPITAL_COMMUNITY): Payer: Self-pay | Admitting: Marriage and Family Therapist

## 2008-10-31 ENCOUNTER — Ambulatory Visit (HOSPITAL_COMMUNITY): Payer: Self-pay | Admitting: Marriage and Family Therapist

## 2008-11-01 ENCOUNTER — Ambulatory Visit (HOSPITAL_COMMUNITY): Payer: Self-pay | Admitting: Psychiatry

## 2008-11-03 ENCOUNTER — Ambulatory Visit (HOSPITAL_COMMUNITY): Payer: Self-pay | Admitting: Marriage and Family Therapist

## 2008-11-04 ENCOUNTER — Ambulatory Visit: Payer: Self-pay | Admitting: Obstetrics & Gynecology

## 2008-11-10 ENCOUNTER — Ambulatory Visit (HOSPITAL_COMMUNITY): Payer: Self-pay | Admitting: Marriage and Family Therapist

## 2008-11-17 ENCOUNTER — Ambulatory Visit (HOSPITAL_COMMUNITY): Payer: Self-pay | Admitting: Marriage and Family Therapist

## 2008-11-18 ENCOUNTER — Ambulatory Visit: Payer: Self-pay | Admitting: Obstetrics & Gynecology

## 2008-11-21 ENCOUNTER — Encounter (INDEPENDENT_AMBULATORY_CARE_PROVIDER_SITE_OTHER): Payer: Self-pay | Admitting: Internal Medicine

## 2008-11-21 ENCOUNTER — Encounter: Payer: Self-pay | Admitting: Internal Medicine

## 2008-11-23 ENCOUNTER — Ambulatory Visit: Payer: Self-pay | Admitting: Internal Medicine

## 2008-11-24 ENCOUNTER — Ambulatory Visit (HOSPITAL_COMMUNITY): Payer: Self-pay | Admitting: Marriage and Family Therapist

## 2008-12-01 ENCOUNTER — Ambulatory Visit (HOSPITAL_COMMUNITY): Payer: Self-pay | Admitting: Marriage and Family Therapist

## 2008-12-08 ENCOUNTER — Ambulatory Visit (HOSPITAL_COMMUNITY): Payer: Self-pay | Admitting: Marriage and Family Therapist

## 2008-12-13 ENCOUNTER — Ambulatory Visit (HOSPITAL_COMMUNITY): Payer: Self-pay | Admitting: Marriage and Family Therapist

## 2008-12-15 ENCOUNTER — Ambulatory Visit (HOSPITAL_COMMUNITY): Payer: Self-pay | Admitting: Marriage and Family Therapist

## 2008-12-29 ENCOUNTER — Ambulatory Visit (HOSPITAL_COMMUNITY): Payer: Self-pay | Admitting: Marriage and Family Therapist

## 2008-12-29 ENCOUNTER — Telehealth (INDEPENDENT_AMBULATORY_CARE_PROVIDER_SITE_OTHER): Payer: Self-pay | Admitting: Internal Medicine

## 2008-12-30 ENCOUNTER — Ambulatory Visit: Payer: Self-pay | Admitting: Obstetrics & Gynecology

## 2008-12-30 LAB — CONVERTED CEMR LAB
Trich, Wet Prep: NONE SEEN
Yeast Wet Prep HPF POC: NONE SEEN

## 2009-01-05 ENCOUNTER — Ambulatory Visit (HOSPITAL_COMMUNITY): Payer: Self-pay | Admitting: Marriage and Family Therapist

## 2009-01-16 ENCOUNTER — Ambulatory Visit (HOSPITAL_COMMUNITY): Payer: Self-pay | Admitting: Marriage and Family Therapist

## 2009-01-19 ENCOUNTER — Ambulatory Visit (HOSPITAL_COMMUNITY): Payer: Self-pay | Admitting: Marriage and Family Therapist

## 2009-01-31 ENCOUNTER — Ambulatory Visit (HOSPITAL_COMMUNITY): Payer: Self-pay | Admitting: Marriage and Family Therapist

## 2009-02-09 ENCOUNTER — Ambulatory Visit (HOSPITAL_COMMUNITY): Payer: Self-pay | Admitting: Marriage and Family Therapist

## 2009-02-16 ENCOUNTER — Ambulatory Visit (HOSPITAL_COMMUNITY): Payer: Self-pay | Admitting: Marriage and Family Therapist

## 2009-02-17 ENCOUNTER — Ambulatory Visit (HOSPITAL_COMMUNITY): Payer: Self-pay | Admitting: Psychiatry

## 2009-03-02 ENCOUNTER — Ambulatory Visit (HOSPITAL_COMMUNITY): Payer: Self-pay | Admitting: Marriage and Family Therapist

## 2009-03-03 ENCOUNTER — Inpatient Hospital Stay (HOSPITAL_COMMUNITY): Admission: AD | Admit: 2009-03-03 | Discharge: 2009-03-03 | Payer: Self-pay | Admitting: Obstetrics & Gynecology

## 2009-03-03 ENCOUNTER — Ambulatory Visit: Payer: Self-pay | Admitting: Obstetrics and Gynecology

## 2009-03-16 ENCOUNTER — Ambulatory Visit (HOSPITAL_COMMUNITY): Payer: Self-pay | Admitting: Marriage and Family Therapist

## 2009-03-21 ENCOUNTER — Telehealth (INDEPENDENT_AMBULATORY_CARE_PROVIDER_SITE_OTHER): Payer: Self-pay | Admitting: Internal Medicine

## 2009-03-23 ENCOUNTER — Ambulatory Visit (HOSPITAL_COMMUNITY): Payer: Self-pay | Admitting: Marriage and Family Therapist

## 2009-03-30 ENCOUNTER — Ambulatory Visit (HOSPITAL_COMMUNITY): Payer: Self-pay | Admitting: Marriage and Family Therapist

## 2009-04-04 ENCOUNTER — Emergency Department (HOSPITAL_COMMUNITY): Admission: EM | Admit: 2009-04-04 | Discharge: 2009-04-04 | Payer: Self-pay | Admitting: Emergency Medicine

## 2009-04-04 IMAGING — CR DG ABDOMEN ACUTE W/ 1V CHEST
3 series · 3 of 3 positions shown · non-contrast
Comparison: None

CLINICAL DATA: Abdominal pain and fever.

ACUTE ABDOMEN SERIES (ABDOMEN 2 VIEW & CHEST 1 VIEW)

[w chest pa]
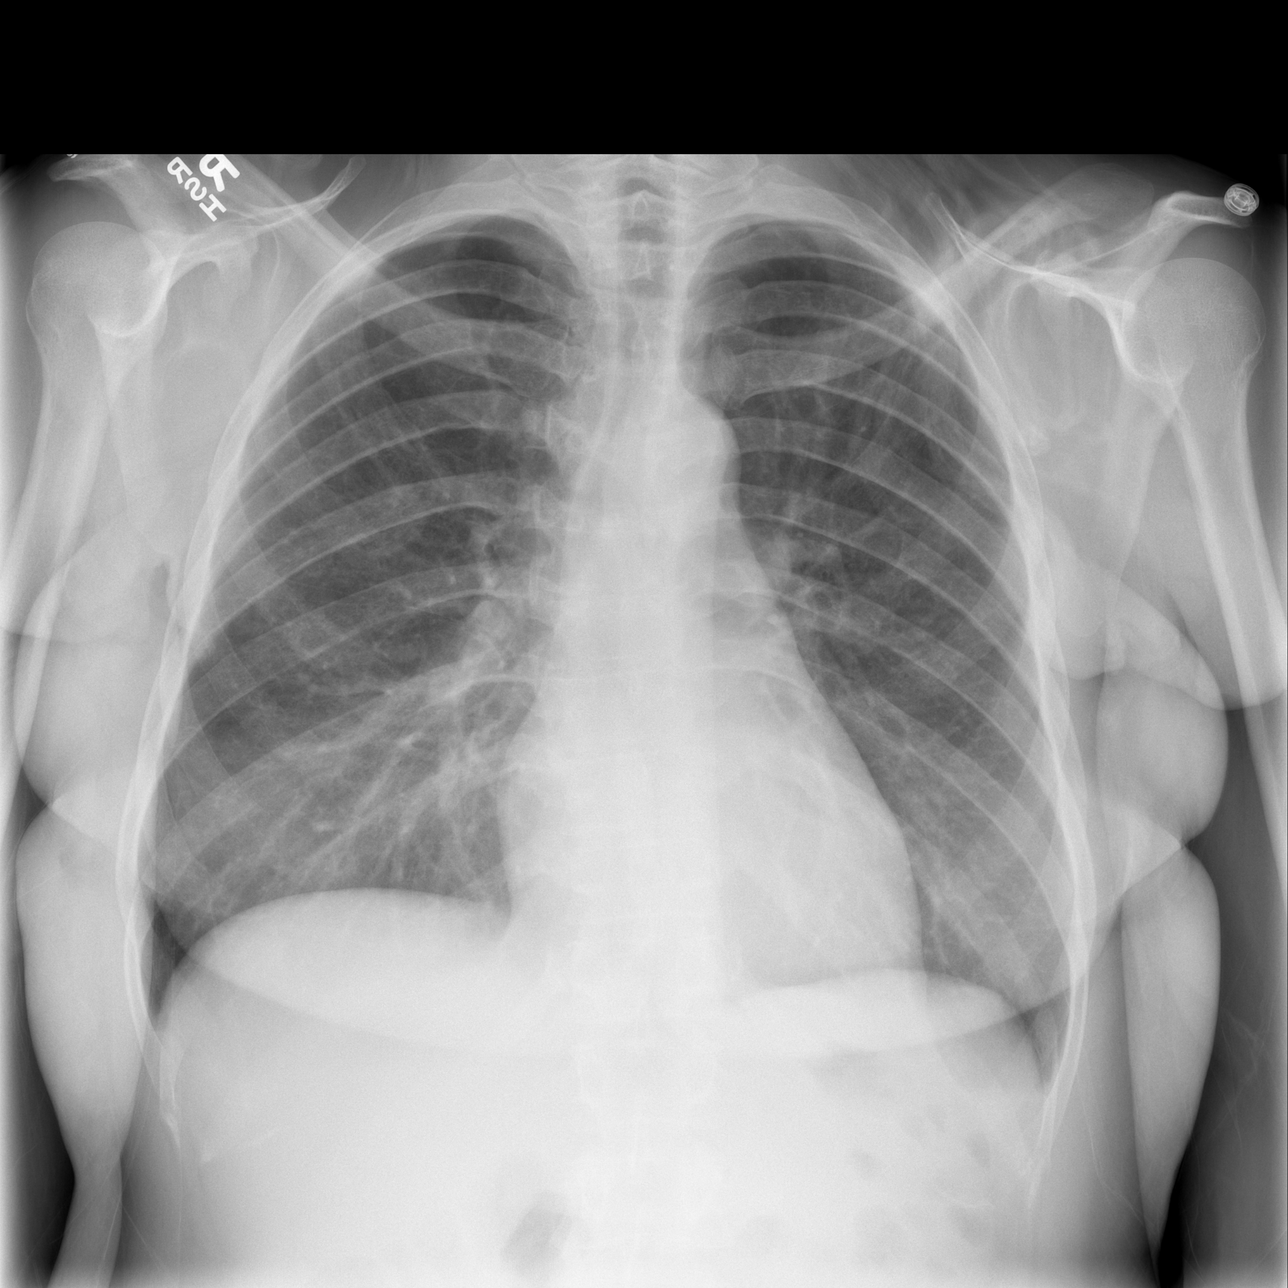

[w abdomen upright]
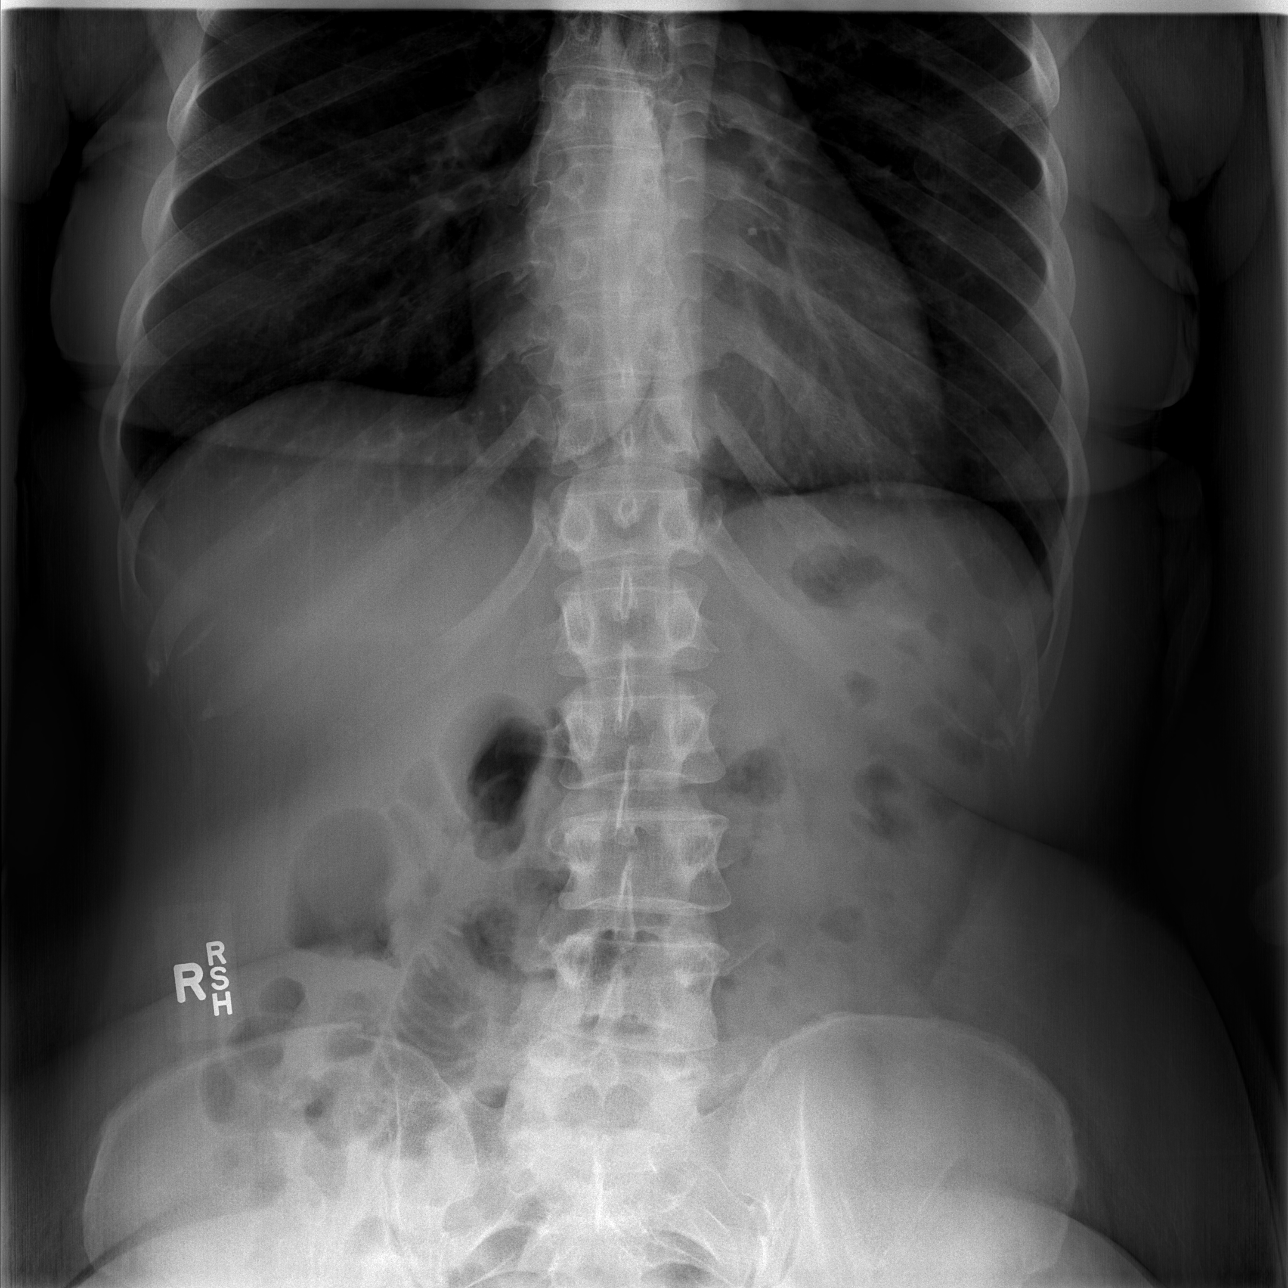

[t abdomen supine]
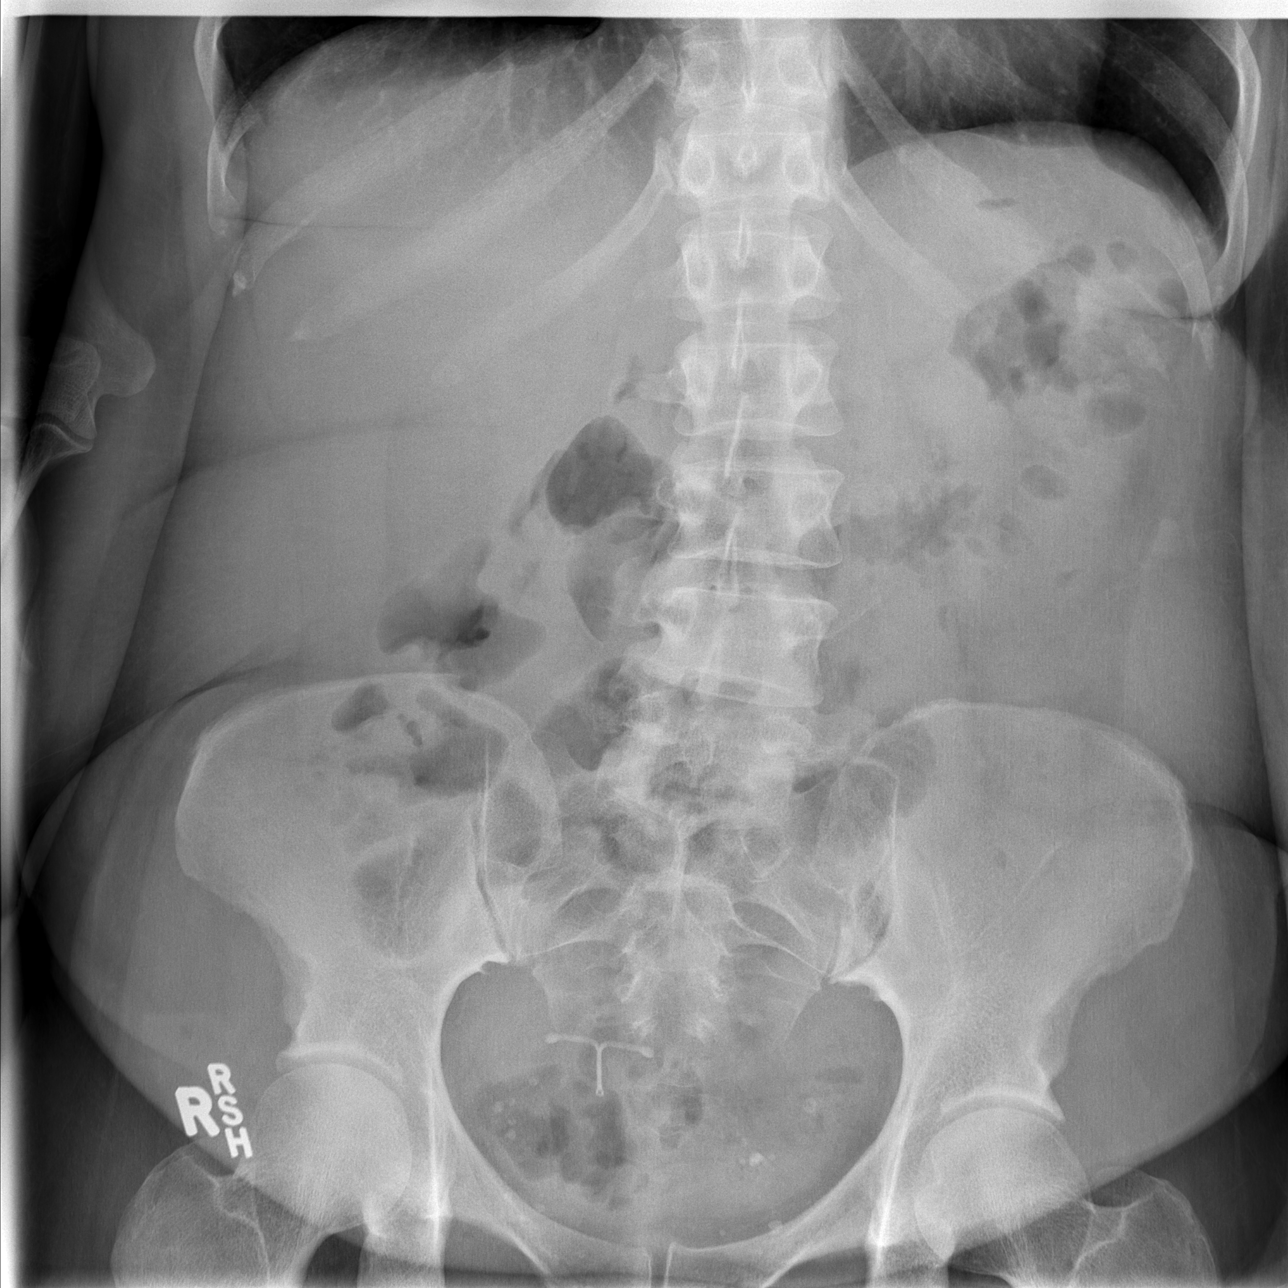

[3 of 3 positions shown; findings below may reference images not displayed]

FINDINGS: The cardiomediastinal silhouette is unremarkable.
The lungs are clear.
There is no evidence of focal airspace disease, pleural effusion,
or pneumothorax.

Nondistended gas-filled loops of small bowel are identified.
Gas in the colon and rectum are present.
There is no evidence of pneumoperitoneum.
An IUD is identified.
Mild apex left lumbar scoliosis is present without acute bony
abnormality.
IMPRESSION: Nonspecific nonobstructive bowel gas pattern - no evidence of
pneumoperitoneum.

No evidence of acute cardiopulmonary disease.

## 2009-04-06 ENCOUNTER — Ambulatory Visit (HOSPITAL_COMMUNITY): Payer: Self-pay | Admitting: Marriage and Family Therapist

## 2009-04-11 ENCOUNTER — Emergency Department (HOSPITAL_COMMUNITY): Admission: EM | Admit: 2009-04-11 | Discharge: 2009-04-11 | Payer: Self-pay | Admitting: Emergency Medicine

## 2009-04-11 ENCOUNTER — Ambulatory Visit: Payer: Self-pay | Admitting: Internal Medicine

## 2009-04-11 ENCOUNTER — Telehealth (INDEPENDENT_AMBULATORY_CARE_PROVIDER_SITE_OTHER): Payer: Self-pay | Admitting: Internal Medicine

## 2009-04-11 IMAGING — US US PELVIS LIMITED
1 series · 14 of 25 positions shown · non-contrast
Comparison: None.

CLINICAL DATA: Hidradentitis suppurativa with inflammatory lesion
right labia.

US PELVIS TRANSVAGINAL

[Series 1: us pelvis limited · 0.06mm/px · 14 of 26 slices shown]
[im 1/26]
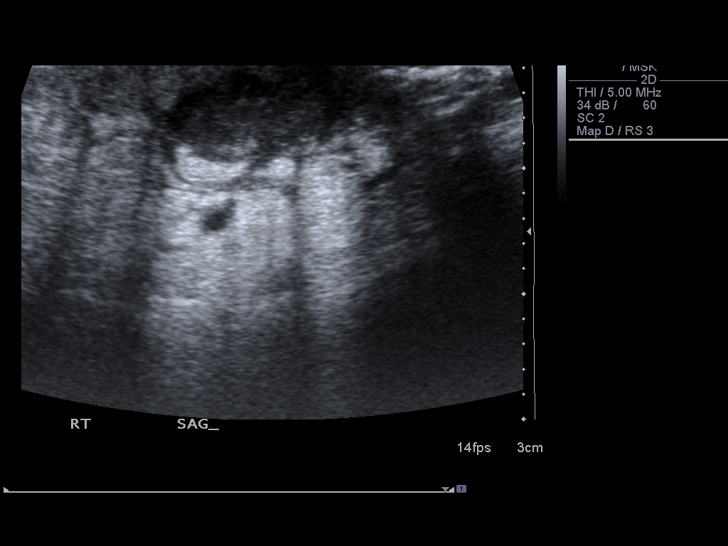
[im 3/26]
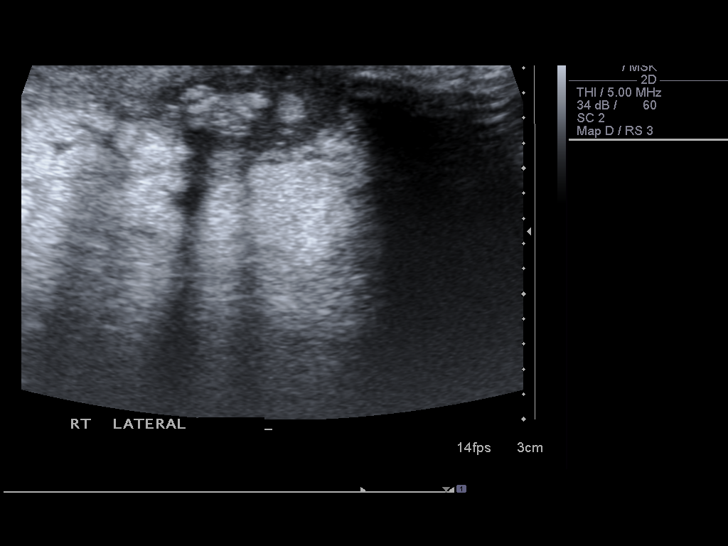
[im 5/26]
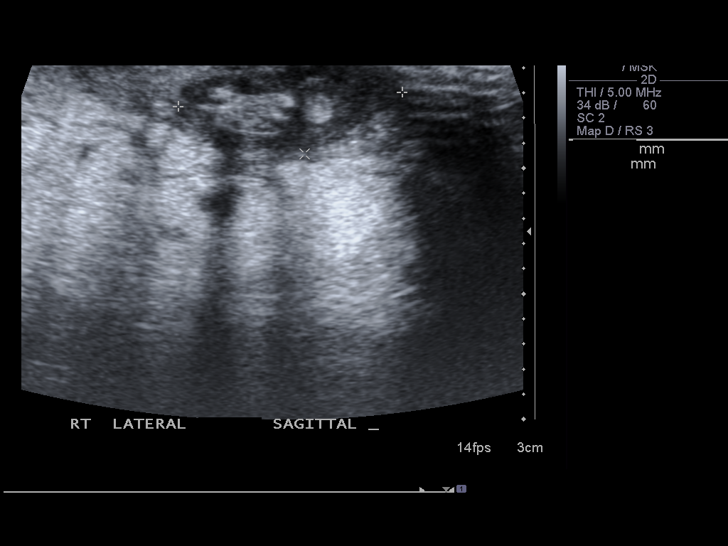
[im 7/26]
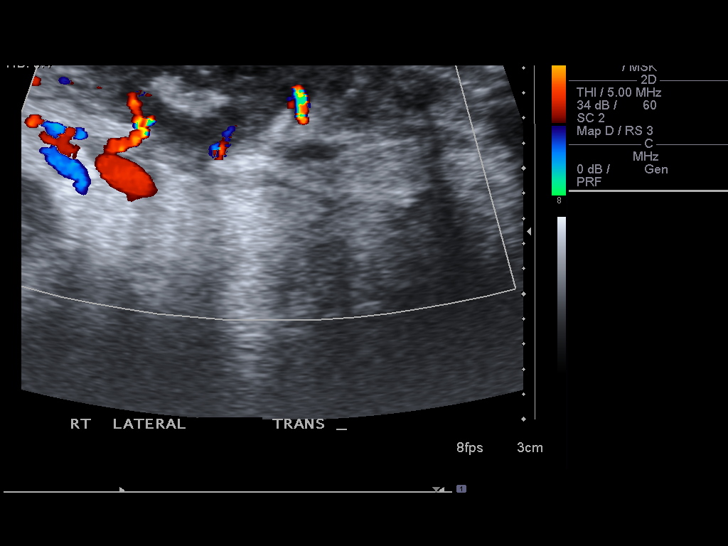
[im 9/26]
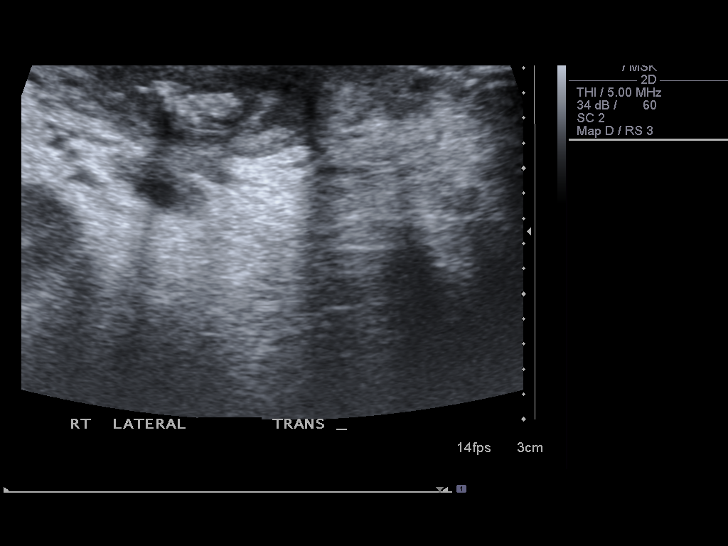
[im 10/26]
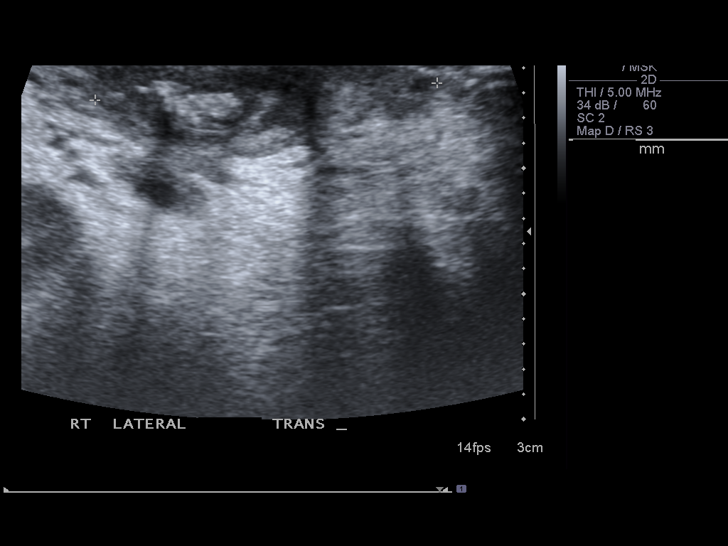
[im 12/26]
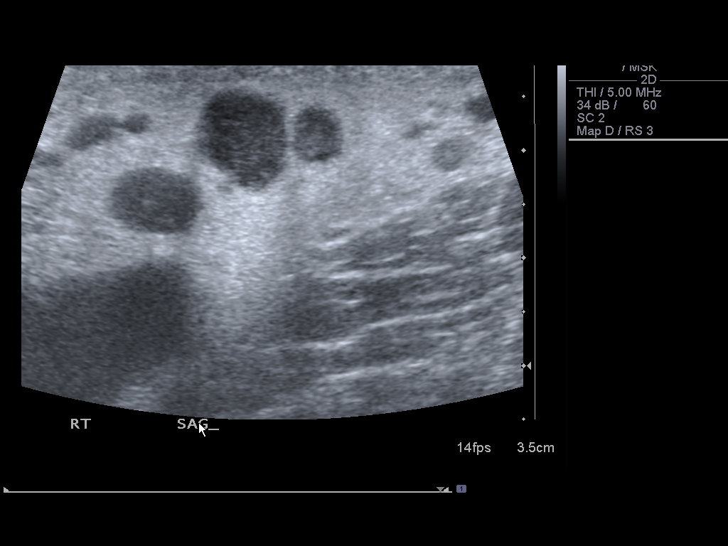
[im 14/26]
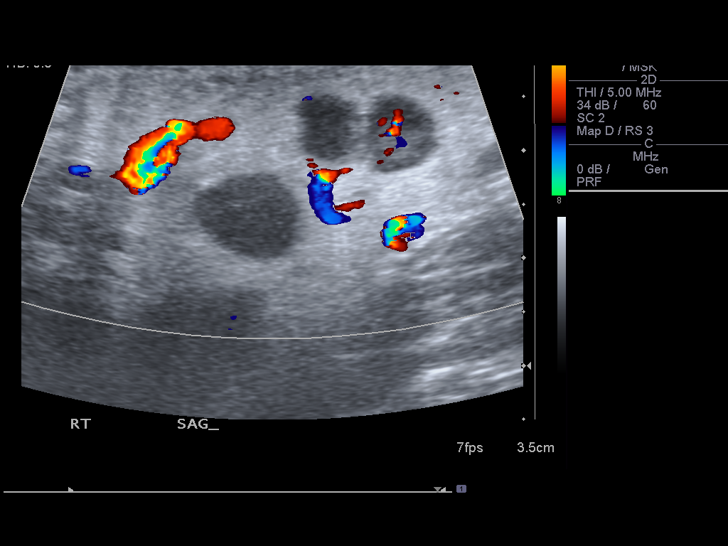
[im 16/26]
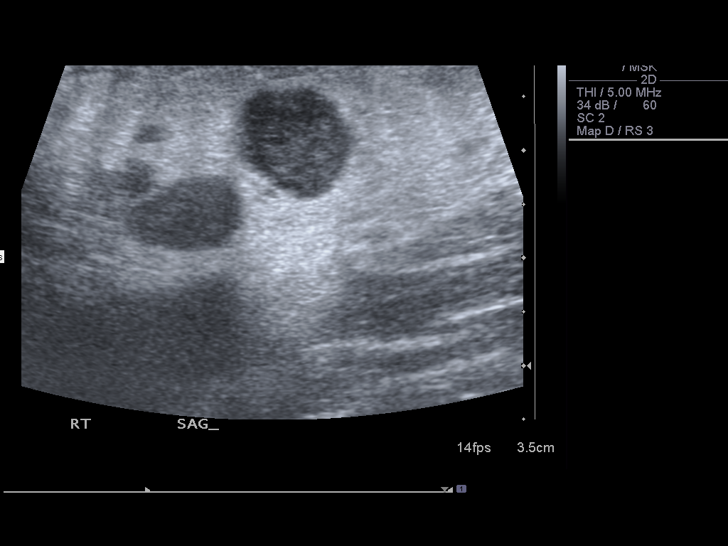
[im 17/26]
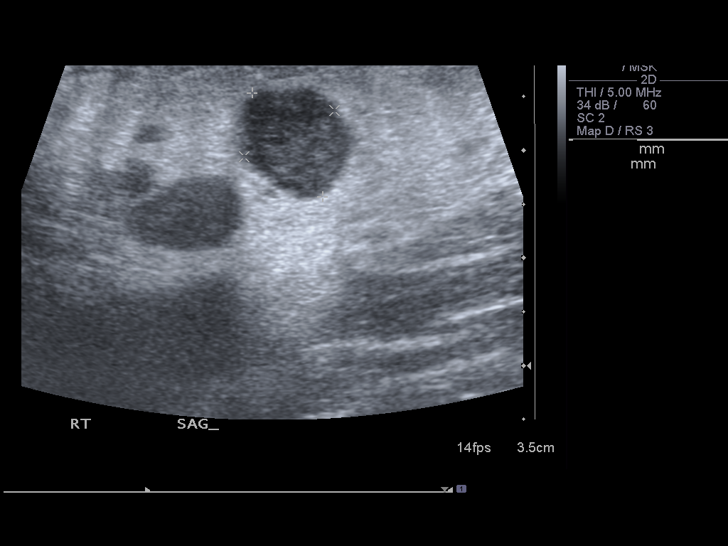
[im 19/26]
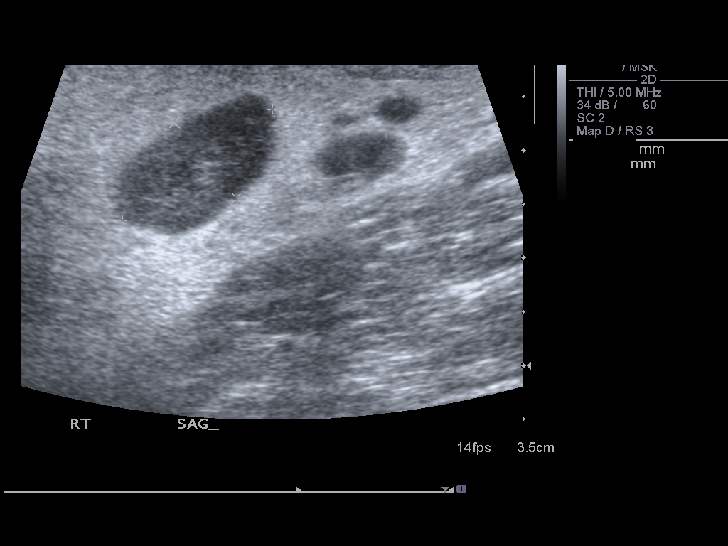
[im 21/26]
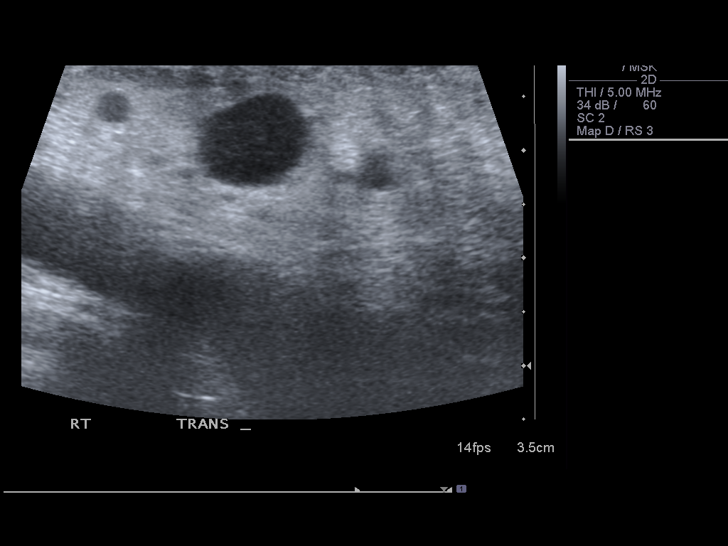
[im 23/26]
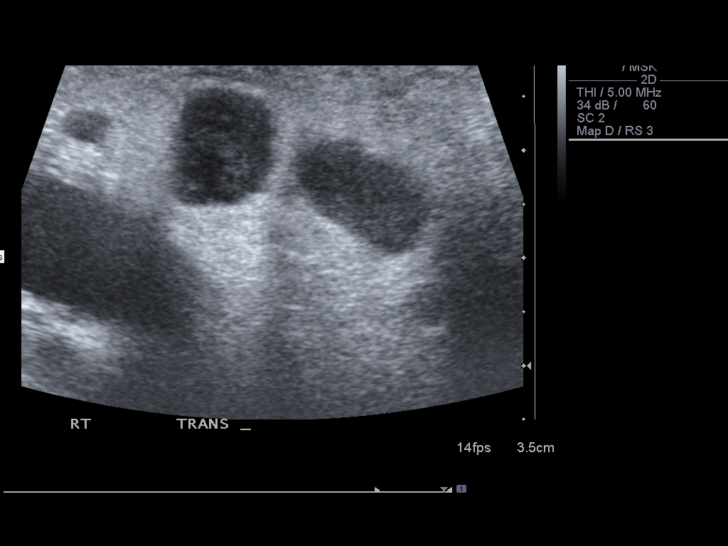
[im 26/26]
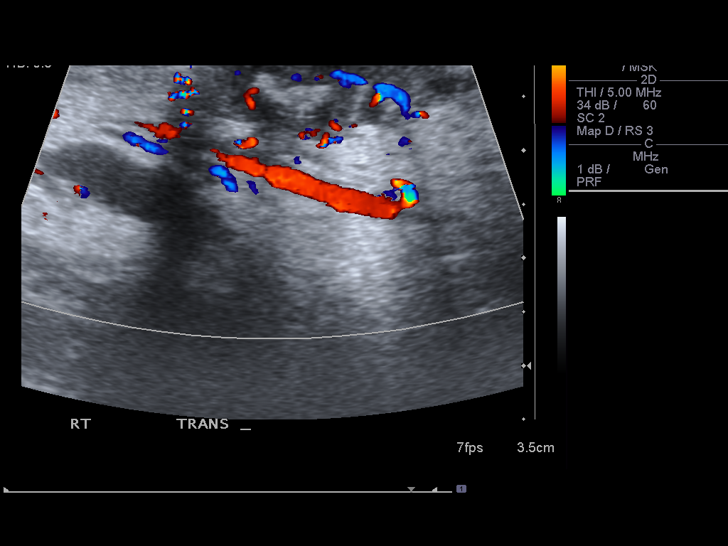

[14 of 25 positions shown; findings below may reference images not displayed]

FINDINGS: Ovoid complex focus measuring 1.8 cm long X 0.8 cm AP X
2.7 cm wide is consistent with clinical history hidradenitis
suppurativa superficially at symptomatic right labia as indicated
by patient.  Increased number small size right inguinal lymph nodes
with largest lymph node measuring upper limits of normal 1.7 cm
long X 0.9 cm AP X 1.1 cm wide.
IMPRESSION: 1.  In region of the suspected hidradenitis suppurativa at the
right labia is nonspecific focus consistent with inflamed apocrine
gland measuring 1.8 cm long X 0.8 cm AP X 2.7 cm wide.
2.  Slight right inguinal adenitis.

## 2009-04-20 ENCOUNTER — Ambulatory Visit (HOSPITAL_COMMUNITY): Payer: Self-pay | Admitting: Marriage and Family Therapist

## 2009-04-28 ENCOUNTER — Ambulatory Visit (HOSPITAL_COMMUNITY): Payer: Self-pay | Admitting: Psychiatry

## 2009-05-04 ENCOUNTER — Ambulatory Visit (HOSPITAL_COMMUNITY): Payer: Self-pay | Admitting: Marriage and Family Therapist

## 2009-05-11 ENCOUNTER — Ambulatory Visit (HOSPITAL_COMMUNITY): Payer: Self-pay | Admitting: Marriage and Family Therapist

## 2009-05-18 ENCOUNTER — Ambulatory Visit (HOSPITAL_COMMUNITY): Payer: Self-pay | Admitting: Marriage and Family Therapist

## 2009-05-23 ENCOUNTER — Ambulatory Visit (HOSPITAL_COMMUNITY): Payer: Self-pay | Admitting: Psychiatry

## 2009-05-29 ENCOUNTER — Emergency Department (HOSPITAL_COMMUNITY): Admission: EM | Admit: 2009-05-29 | Discharge: 2009-05-29 | Payer: Self-pay | Admitting: Emergency Medicine

## 2009-05-29 IMAGING — CR DG HAND COMPLETE 3+V*R*
3 series · 3 of 3 positions shown · non-contrast
Comparison: [DATE]

CLINICAL DATA: Status post fall, right hand pain

RIGHT HAND - COMPLETE 3+ VIEW

[x hand pa right]
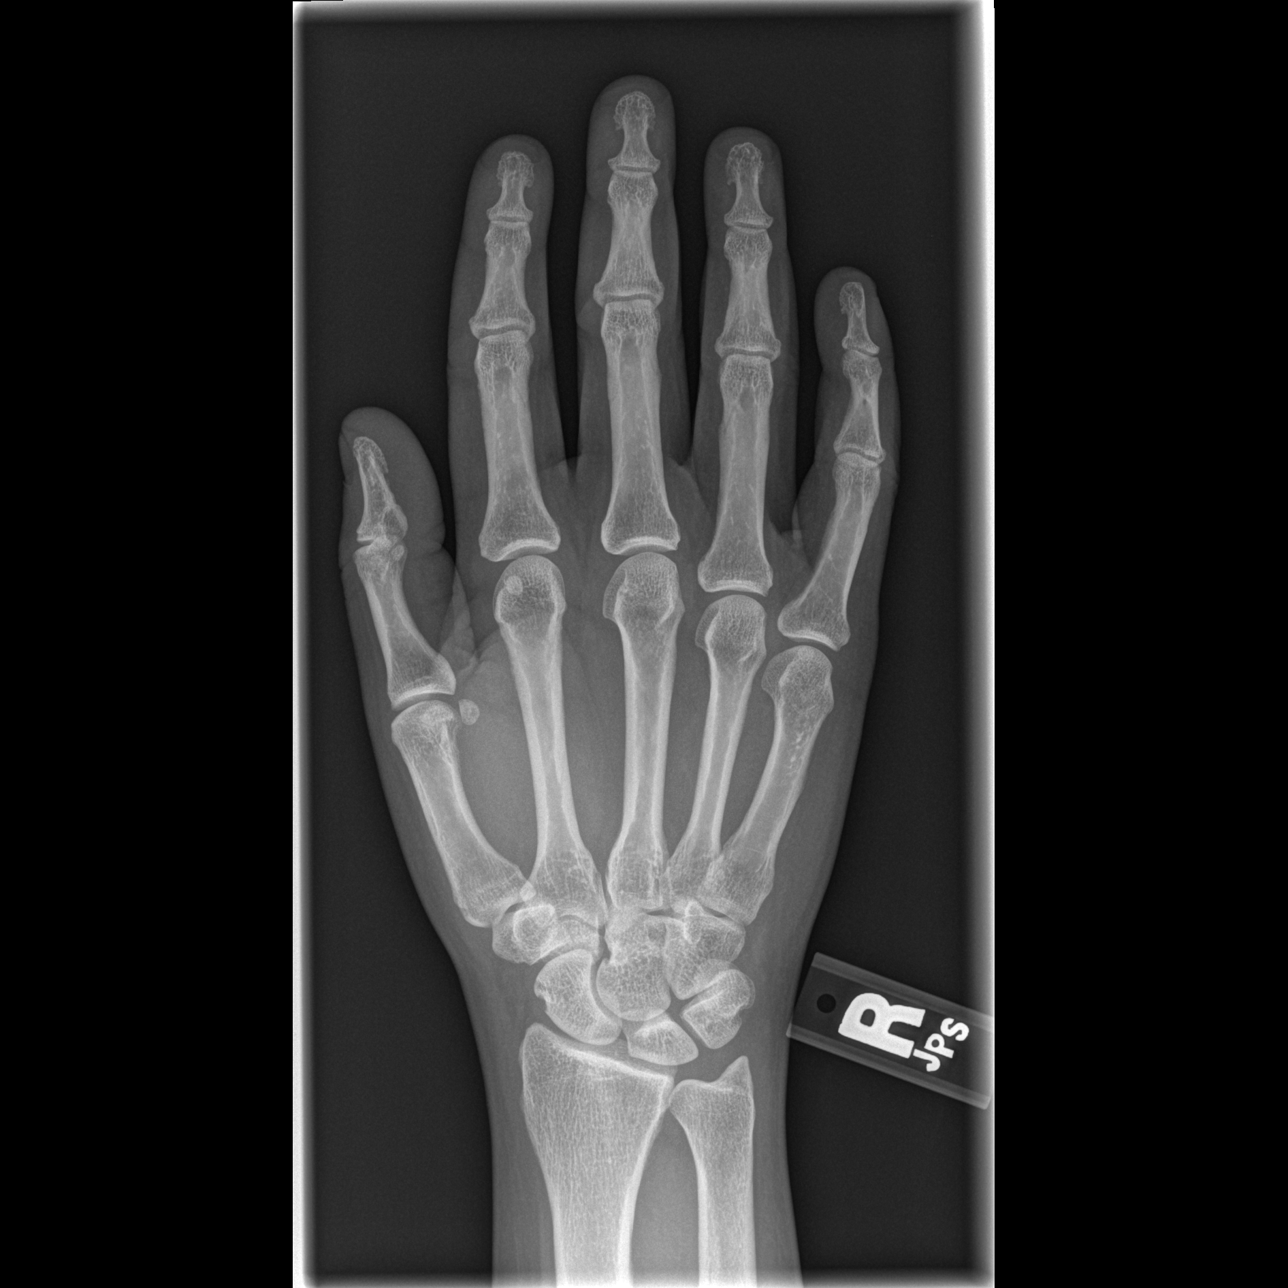

[x hand oblique right]
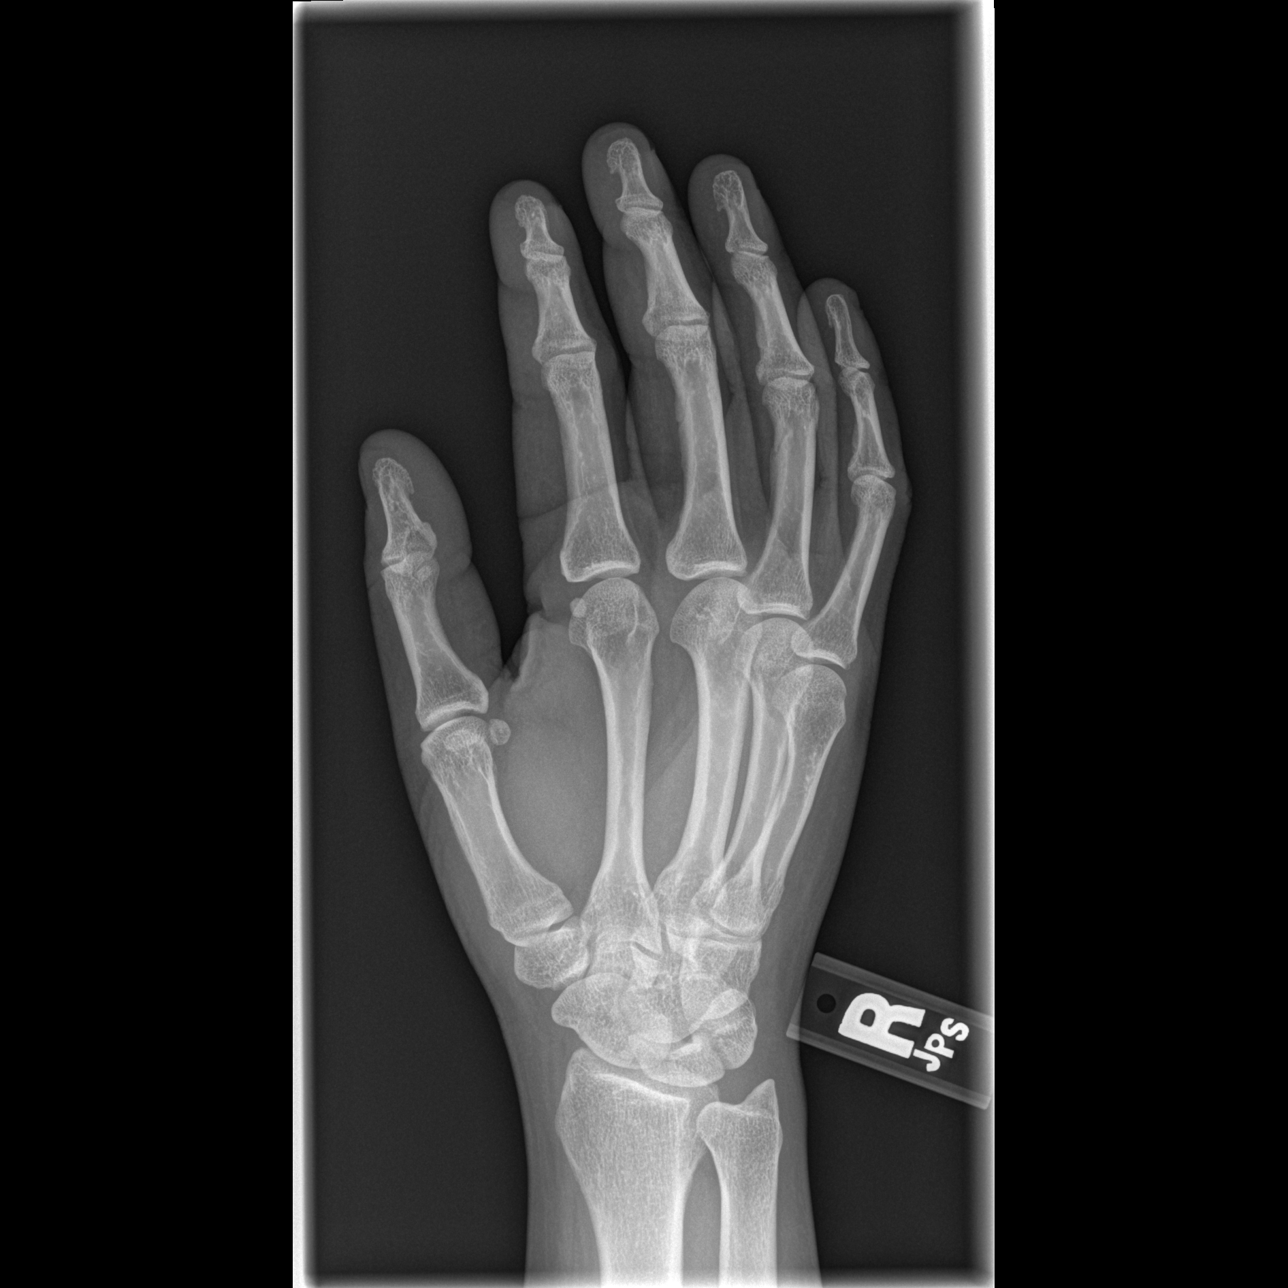

[x hand lat right]
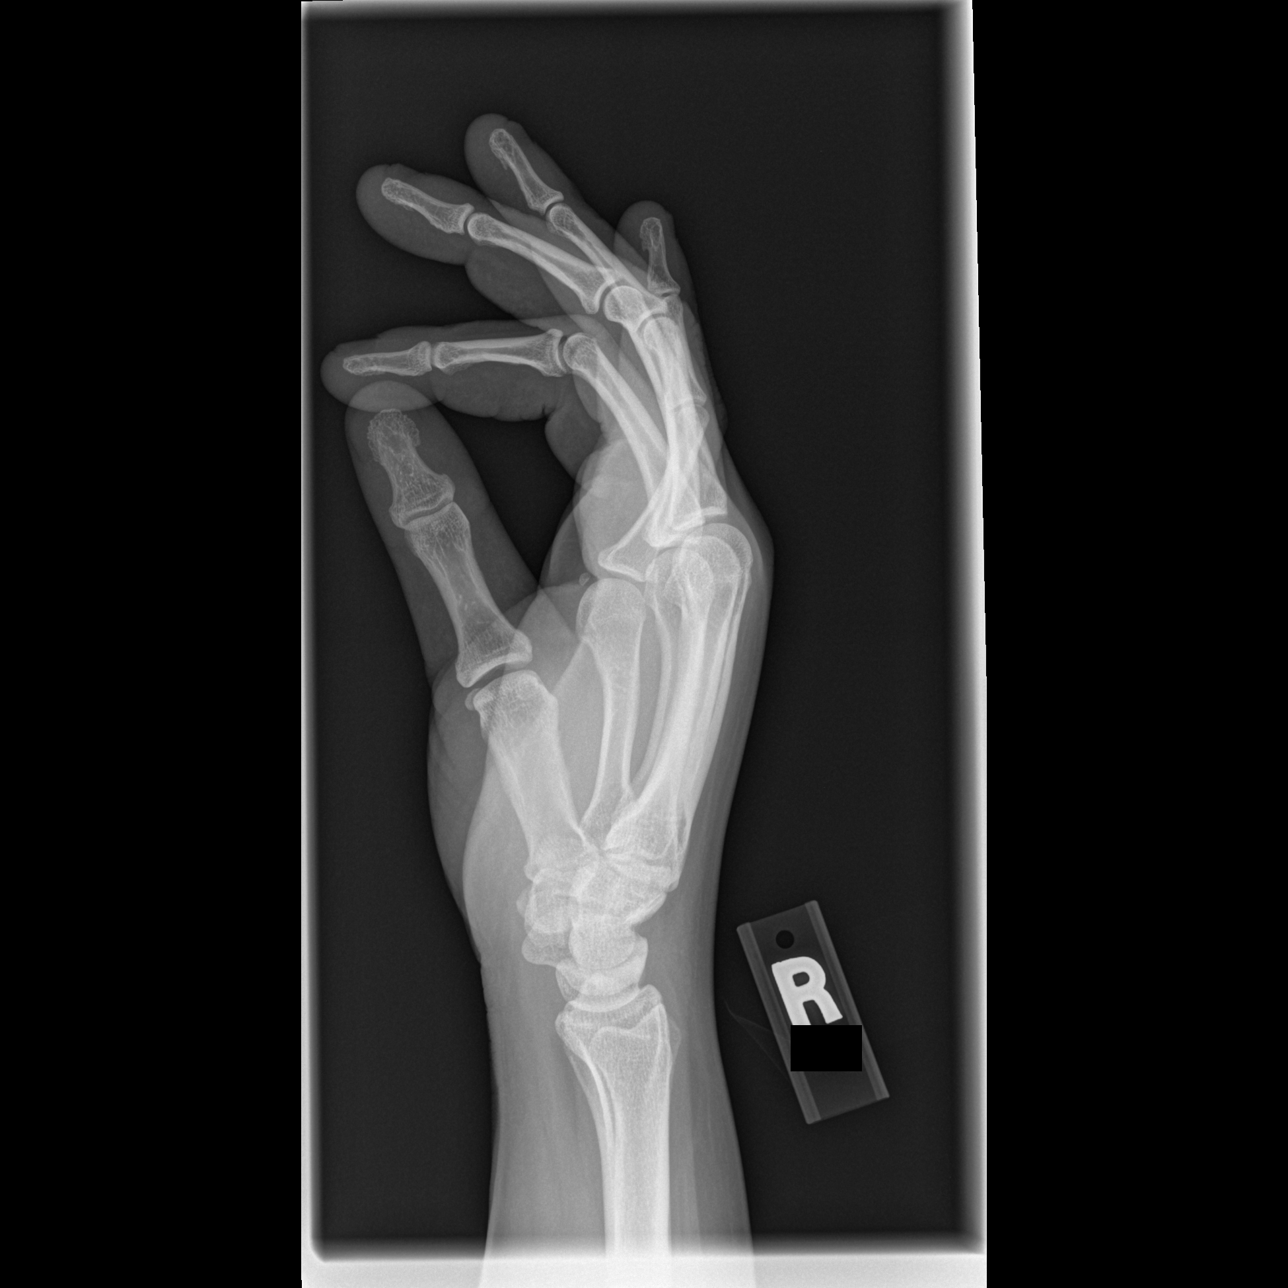

[3 of 3 positions shown; findings below may reference images not displayed]

FINDINGS: Three views of the right hand submitted.  No acute
fracture or subluxation.  No radiopaque foreign body.
IMPRESSION: No acute fracture or subluxation.

## 2009-06-08 ENCOUNTER — Ambulatory Visit (HOSPITAL_COMMUNITY): Payer: Self-pay | Admitting: Marriage and Family Therapist

## 2009-06-15 ENCOUNTER — Ambulatory Visit (HOSPITAL_COMMUNITY): Payer: Self-pay | Admitting: Marriage and Family Therapist

## 2009-06-21 ENCOUNTER — Ambulatory Visit: Payer: Self-pay | Admitting: Internal Medicine

## 2009-06-21 ENCOUNTER — Ambulatory Visit (HOSPITAL_COMMUNITY): Admission: RE | Admit: 2009-06-21 | Discharge: 2009-06-21 | Payer: Self-pay | Admitting: Internal Medicine

## 2009-06-21 IMAGING — CR DG HAND COMPLETE 3+V*R*
3 series · 3 of 3 positions shown · non-contrast
Comparison: Right hand films of [DATE]

CLINICAL DATA: Patient fell with pain and swelling

RIGHT HAND - COMPLETE 3+ VIEW

[x hand pa right]
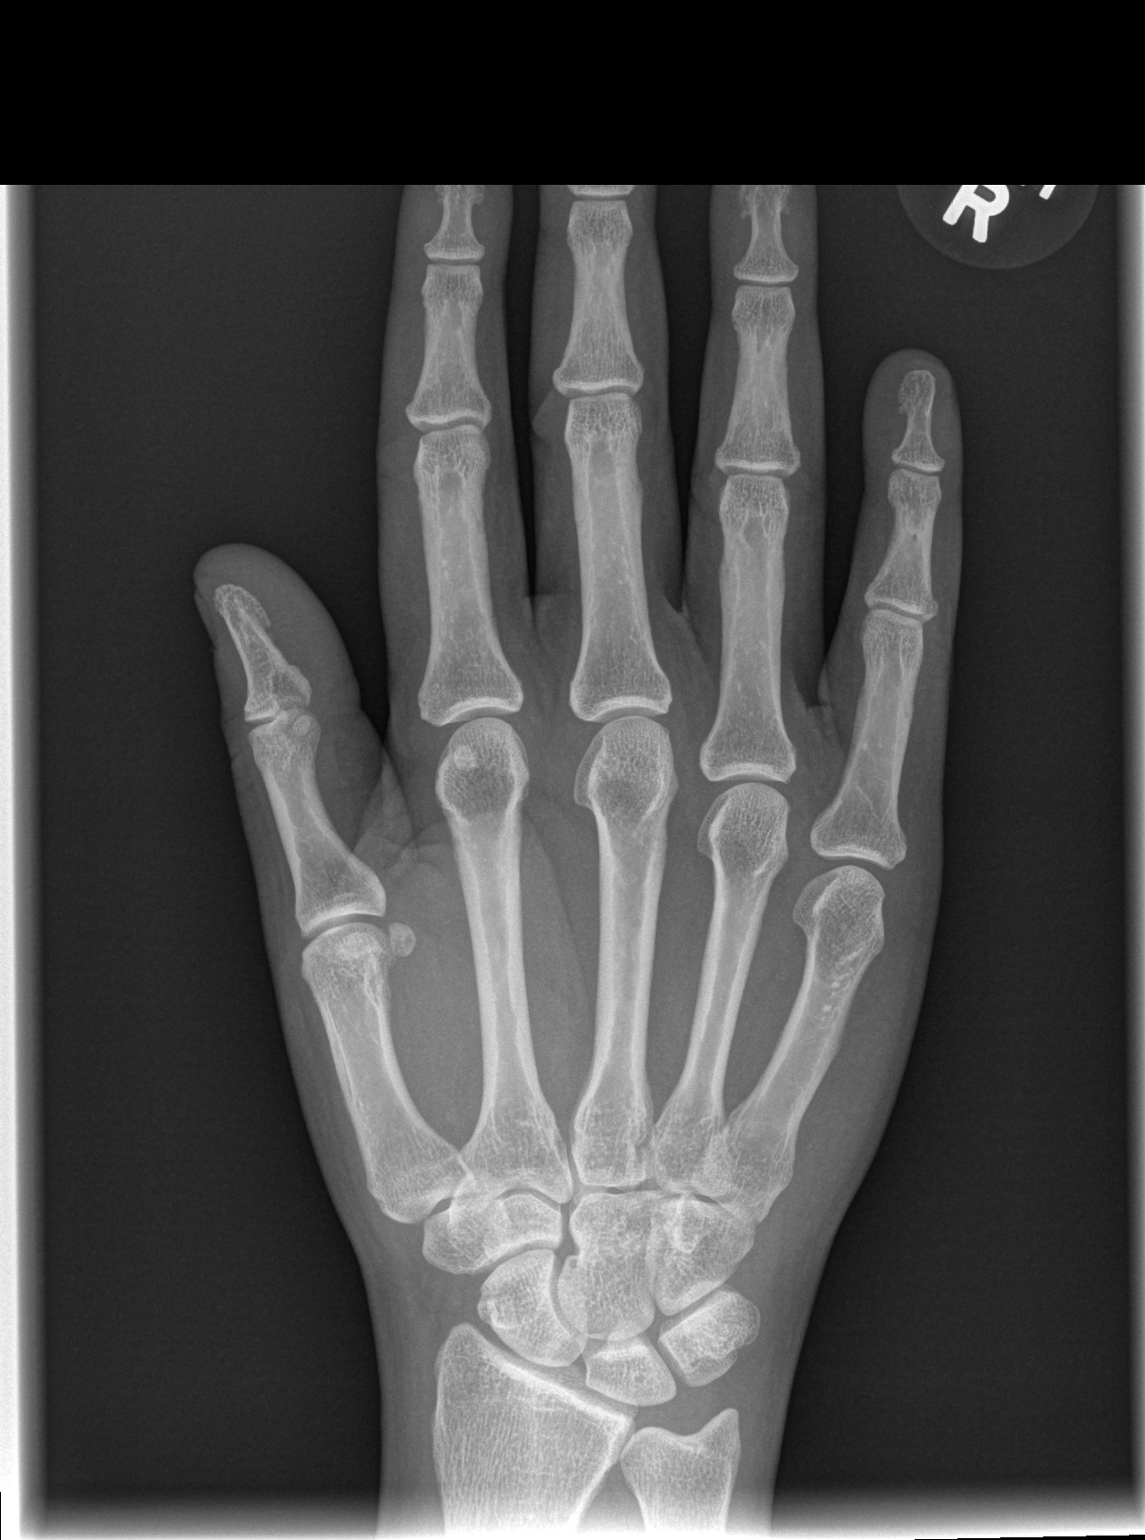

[x hand oblique right]
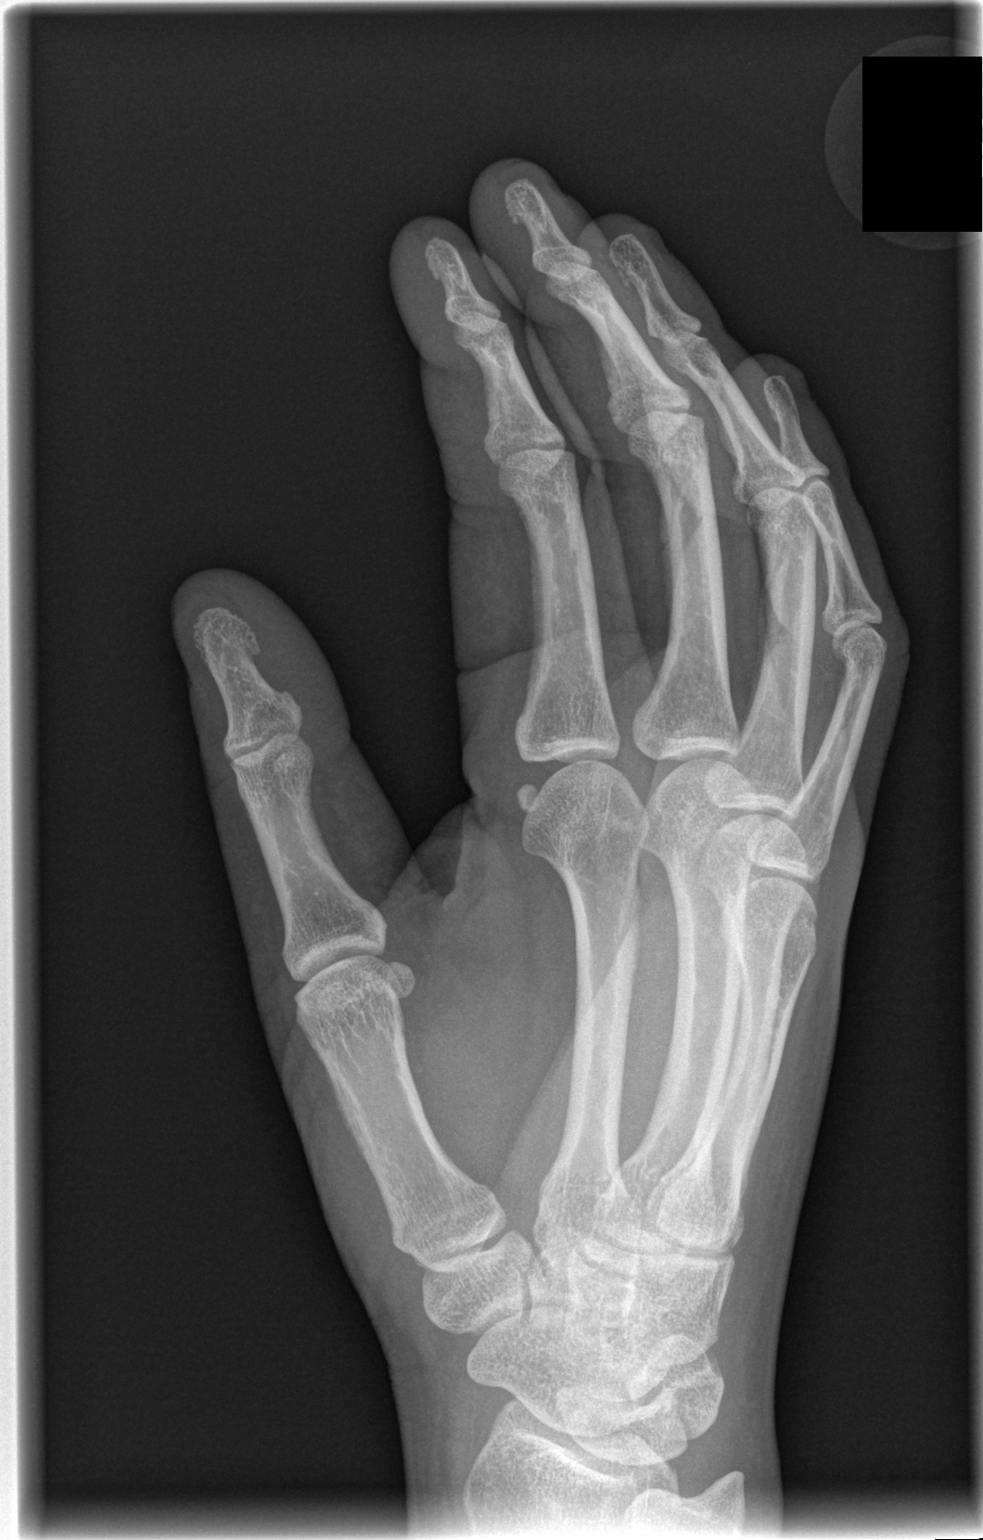

[x hand lat right]
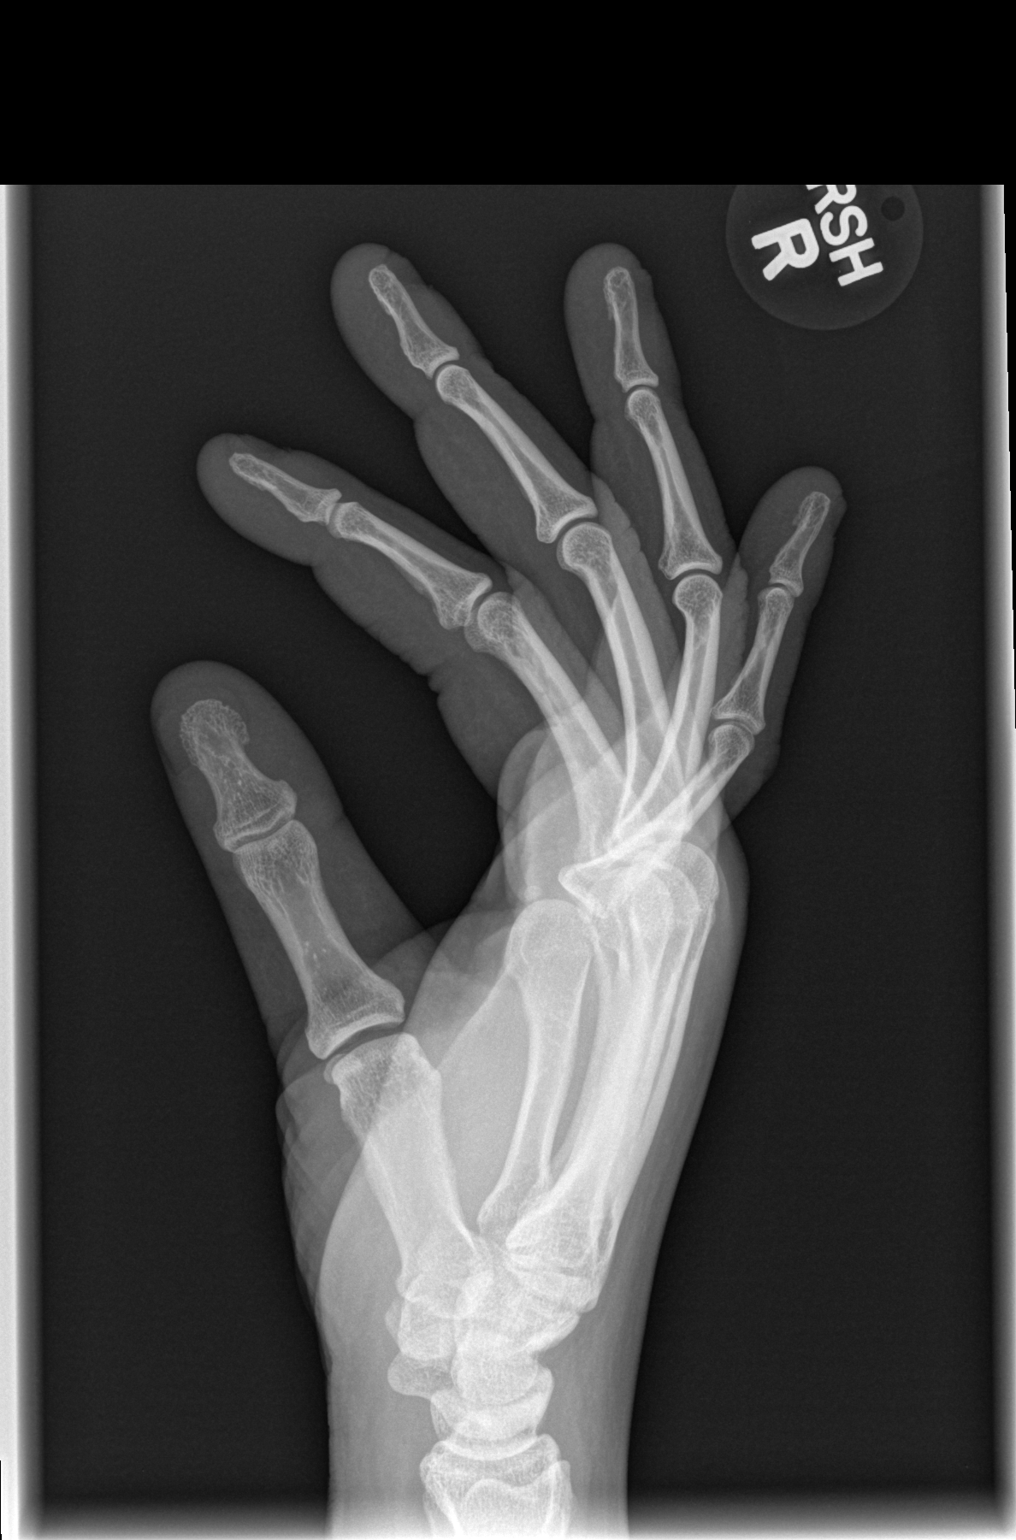

[3 of 3 positions shown; findings below may reference images not displayed]

FINDINGS: The carpal bones appear to be in normal position.  MCP,
PIP, and DIP joints appear normal.  No fracture is seen.
IMPRESSION: No acute fracture.

## 2009-06-21 IMAGING — CR DG FOOT COMPLETE 3+V*R*
3 series · 3 of 3 positions shown · non-contrast
Comparison: Right foot films of [DATE]

CLINICAL DATA: Fell with pain and swelling

RIGHT FOOT COMPLETE - 3+ VIEW

[t foot ap right]
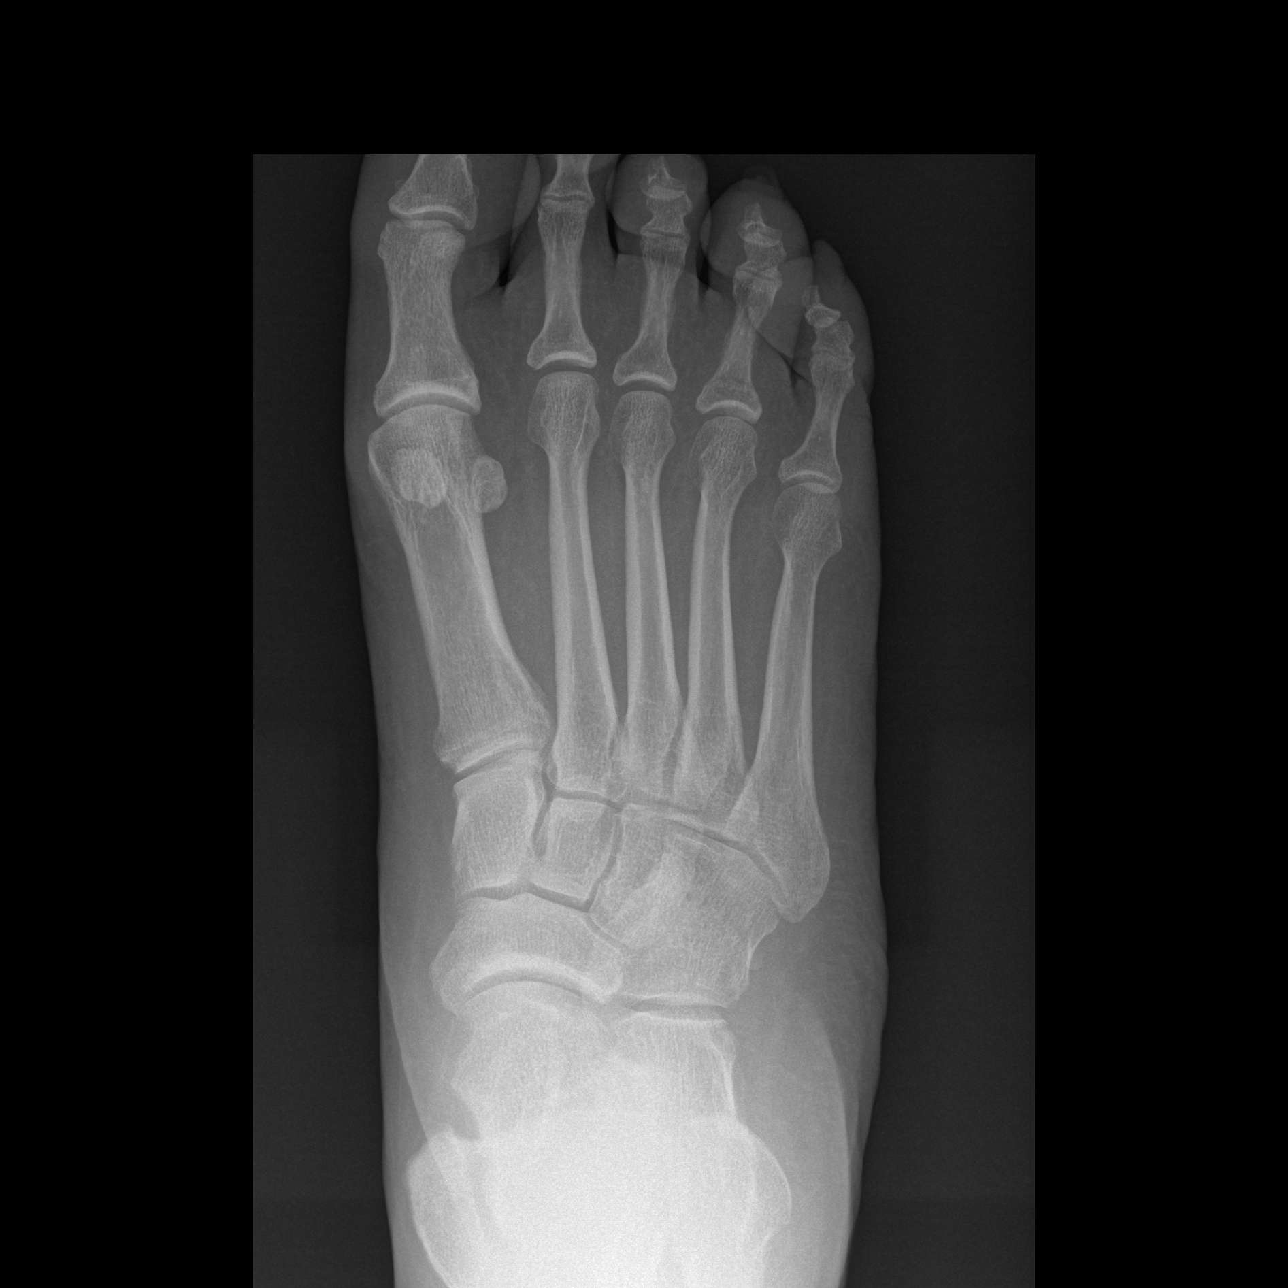

[t foot oblique right]
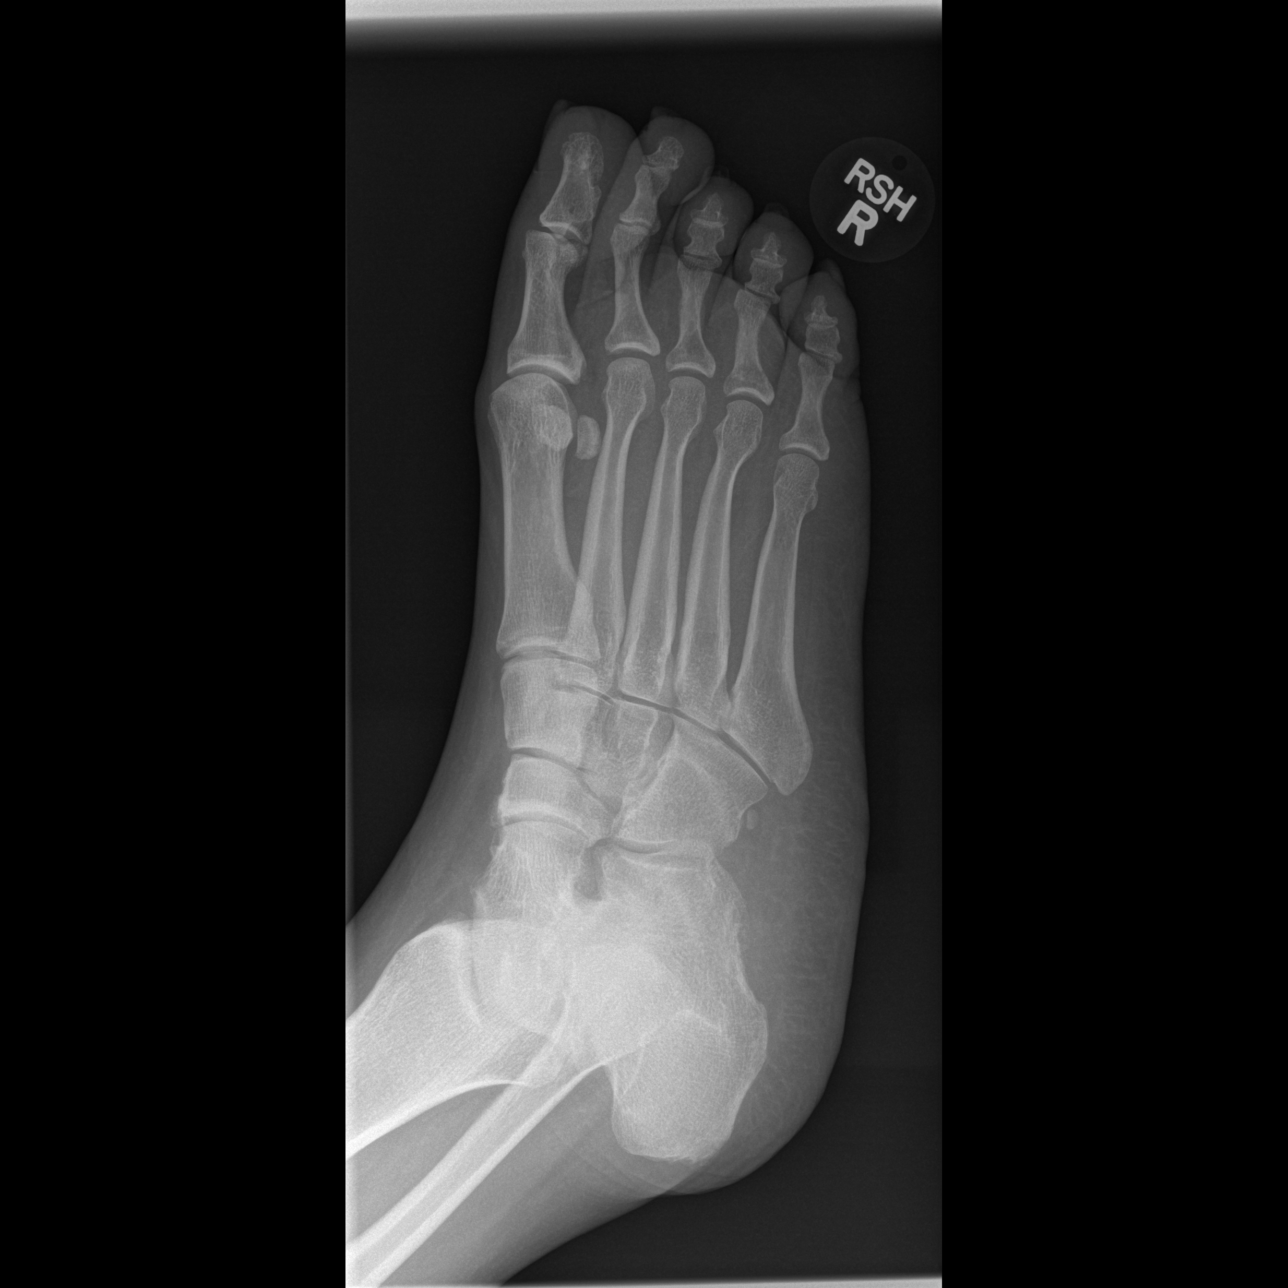

[t foot lat right]
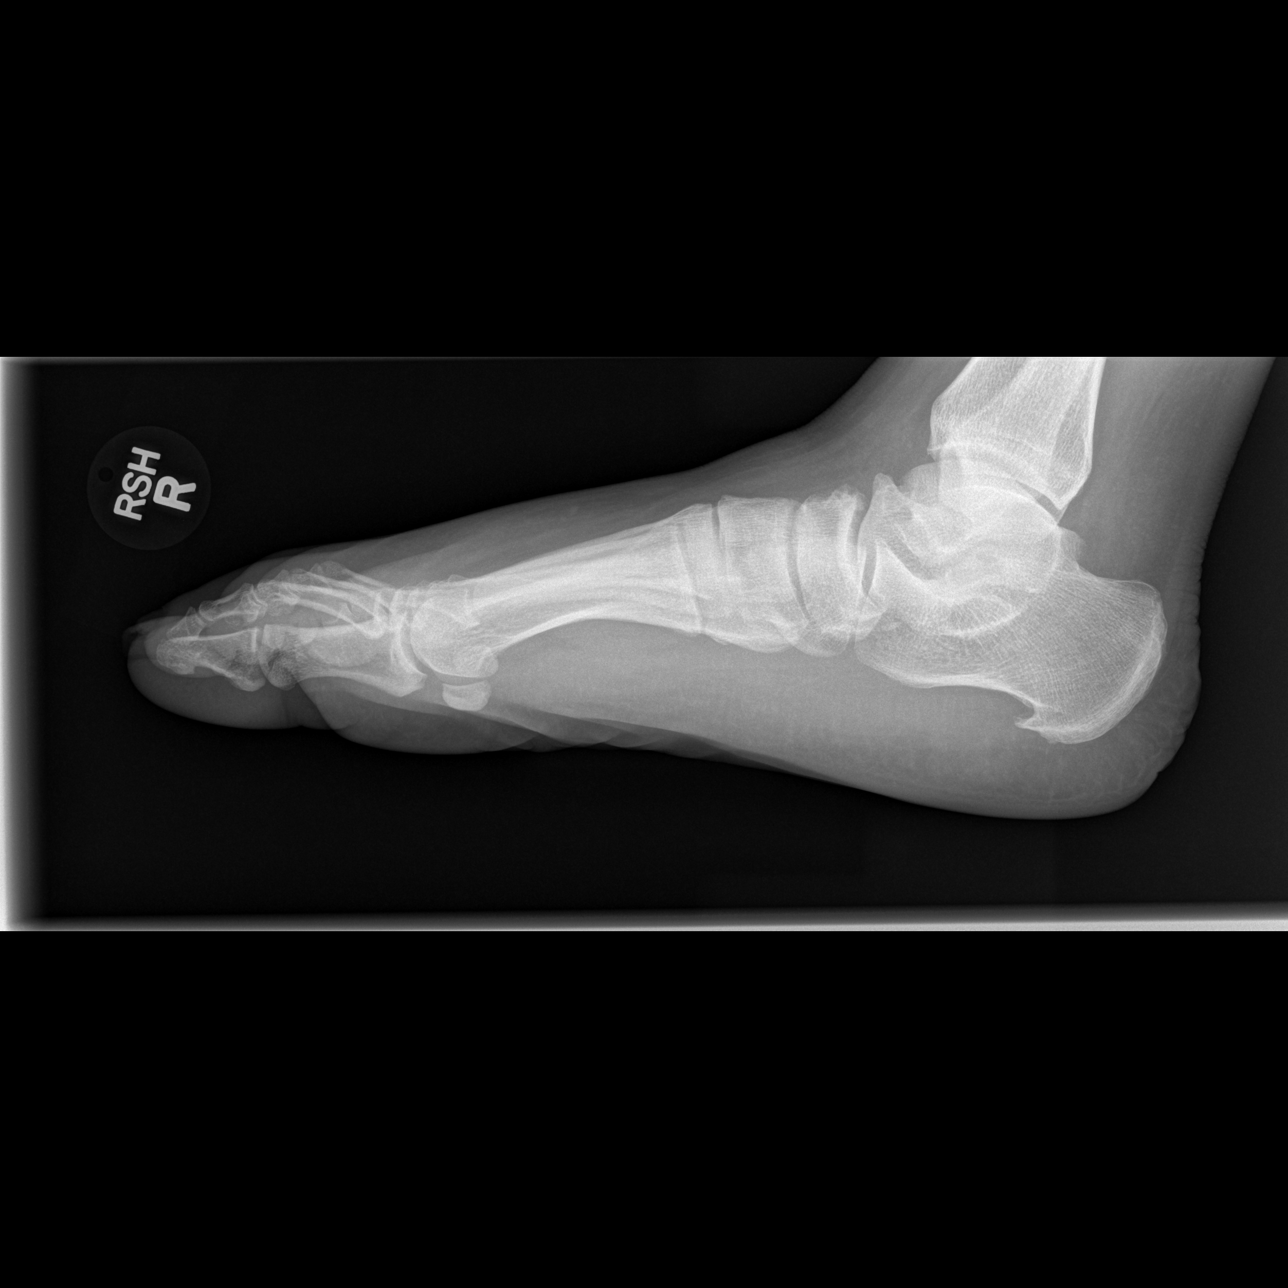

[3 of 3 positions shown; findings below may reference images not displayed]

FINDINGS: Tarsal - metatarsal alignment is normal.  No acute
fracture is seen.  Plantar calcaneal degenerative spur is noted and
there is some degenerative change in the midfoot.
IMPRESSION: No acute bony abnormality.

## 2009-06-22 ENCOUNTER — Ambulatory Visit (HOSPITAL_COMMUNITY): Payer: Self-pay | Admitting: Marriage and Family Therapist

## 2009-06-27 ENCOUNTER — Telehealth (INDEPENDENT_AMBULATORY_CARE_PROVIDER_SITE_OTHER): Payer: Self-pay | Admitting: Internal Medicine

## 2009-06-29 ENCOUNTER — Ambulatory Visit (HOSPITAL_COMMUNITY): Payer: Self-pay | Admitting: Marriage and Family Therapist

## 2009-07-13 ENCOUNTER — Ambulatory Visit (HOSPITAL_COMMUNITY): Payer: Self-pay | Admitting: Marriage and Family Therapist

## 2009-07-20 ENCOUNTER — Ambulatory Visit (HOSPITAL_COMMUNITY): Payer: Self-pay | Admitting: Marriage and Family Therapist

## 2009-07-20 ENCOUNTER — Emergency Department (HOSPITAL_COMMUNITY): Admission: EM | Admit: 2009-07-20 | Discharge: 2009-07-20 | Payer: Self-pay | Admitting: Emergency Medicine

## 2009-07-27 ENCOUNTER — Ambulatory Visit (HOSPITAL_COMMUNITY): Payer: Self-pay | Admitting: Marriage and Family Therapist

## 2009-08-03 ENCOUNTER — Ambulatory Visit (HOSPITAL_COMMUNITY): Payer: Self-pay | Admitting: Marriage and Family Therapist

## 2009-08-09 ENCOUNTER — Encounter: Admission: RE | Admit: 2009-08-09 | Discharge: 2009-09-14 | Payer: Self-pay | Admitting: Internal Medicine

## 2009-08-10 ENCOUNTER — Ambulatory Visit (HOSPITAL_COMMUNITY): Payer: Self-pay | Admitting: Marriage and Family Therapist

## 2009-08-16 ENCOUNTER — Telehealth: Payer: Self-pay | Admitting: Internal Medicine

## 2009-08-16 ENCOUNTER — Encounter: Payer: Self-pay | Admitting: Internal Medicine

## 2009-08-16 IMAGING — CR DG CHEST 2V
2 series · 2 of 2 positions shown · non-contrast
Comparison: [DATE]

CLINICAL DATA: 38-year-old female preop evaluation

CHEST - 2 VIEW

[w chest pa]
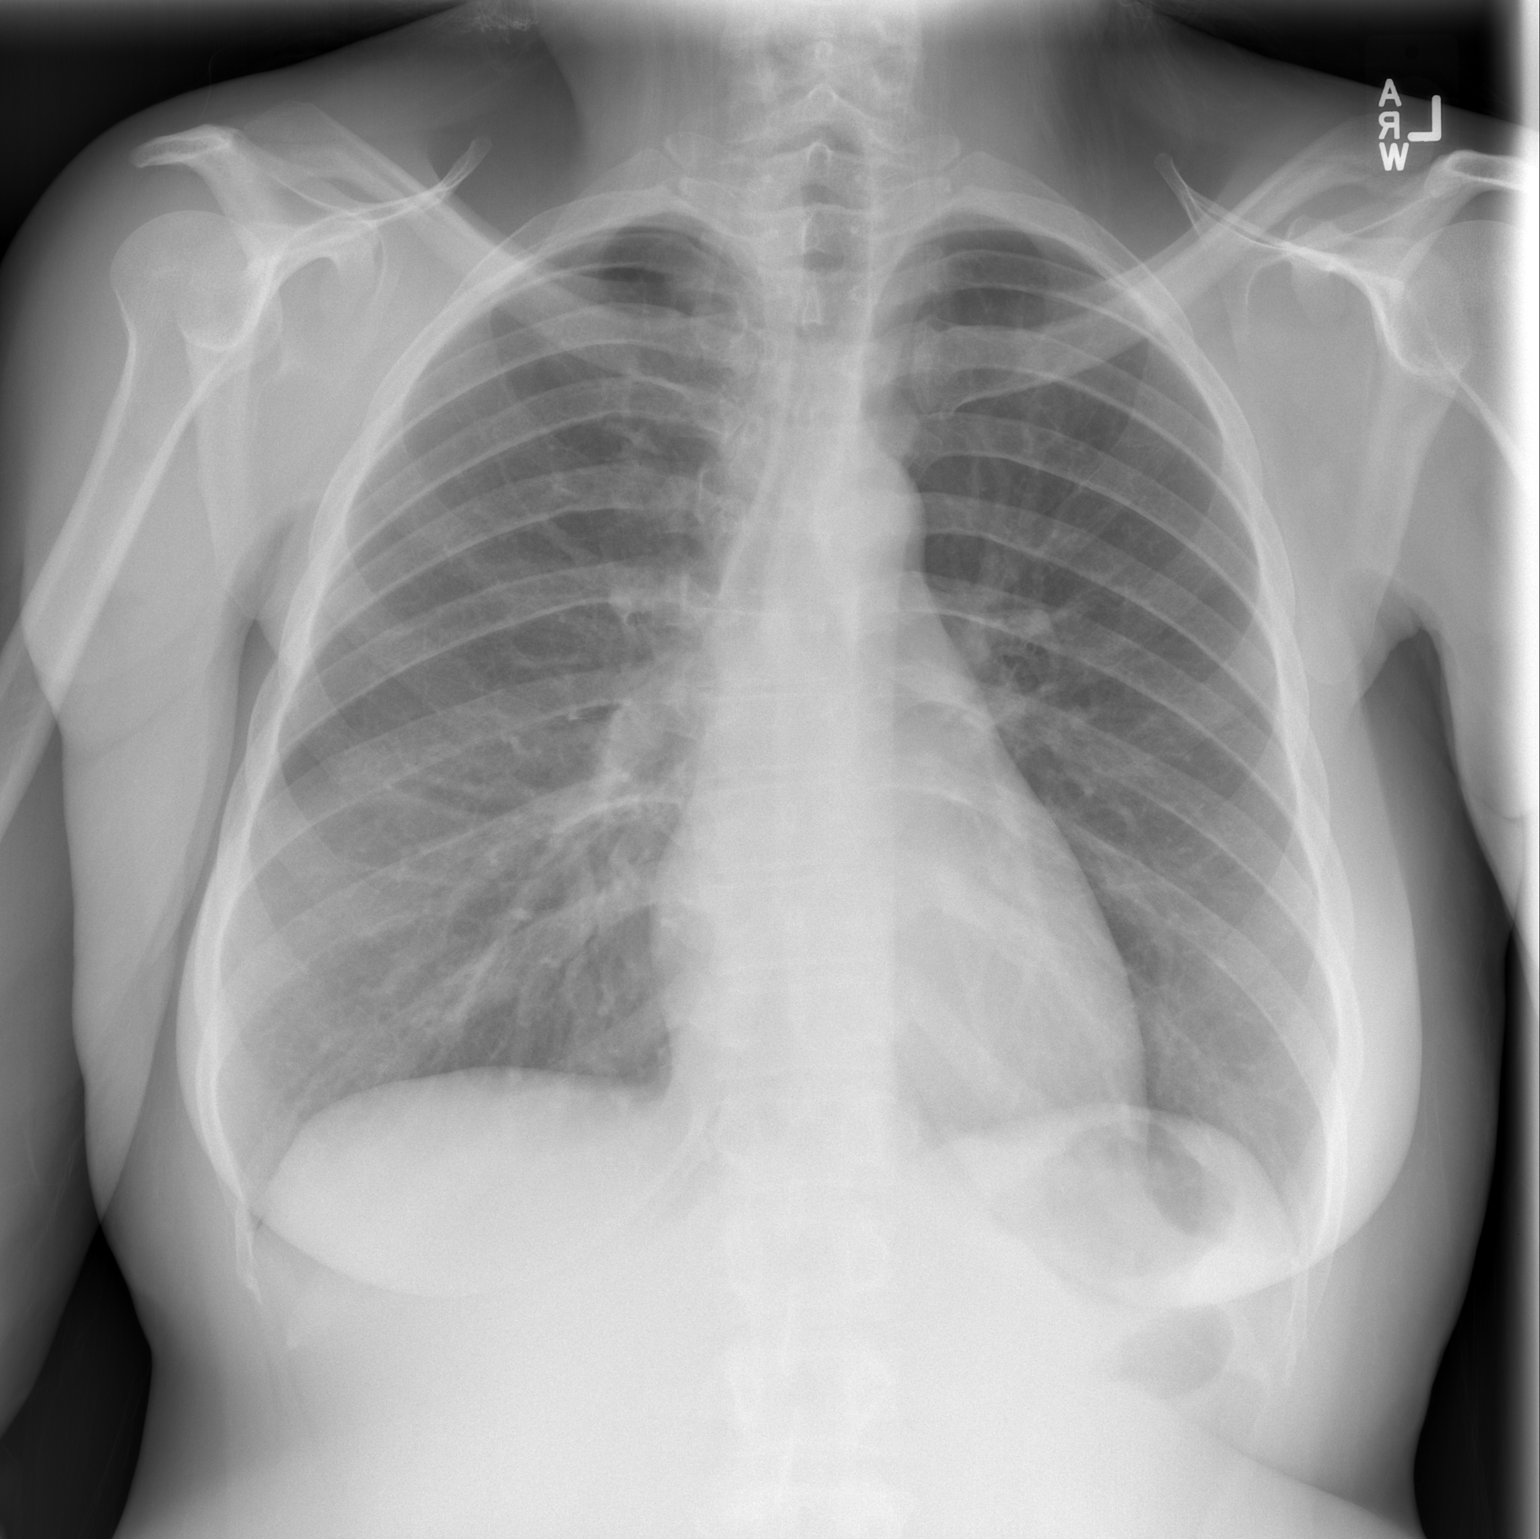

[w chest lat]
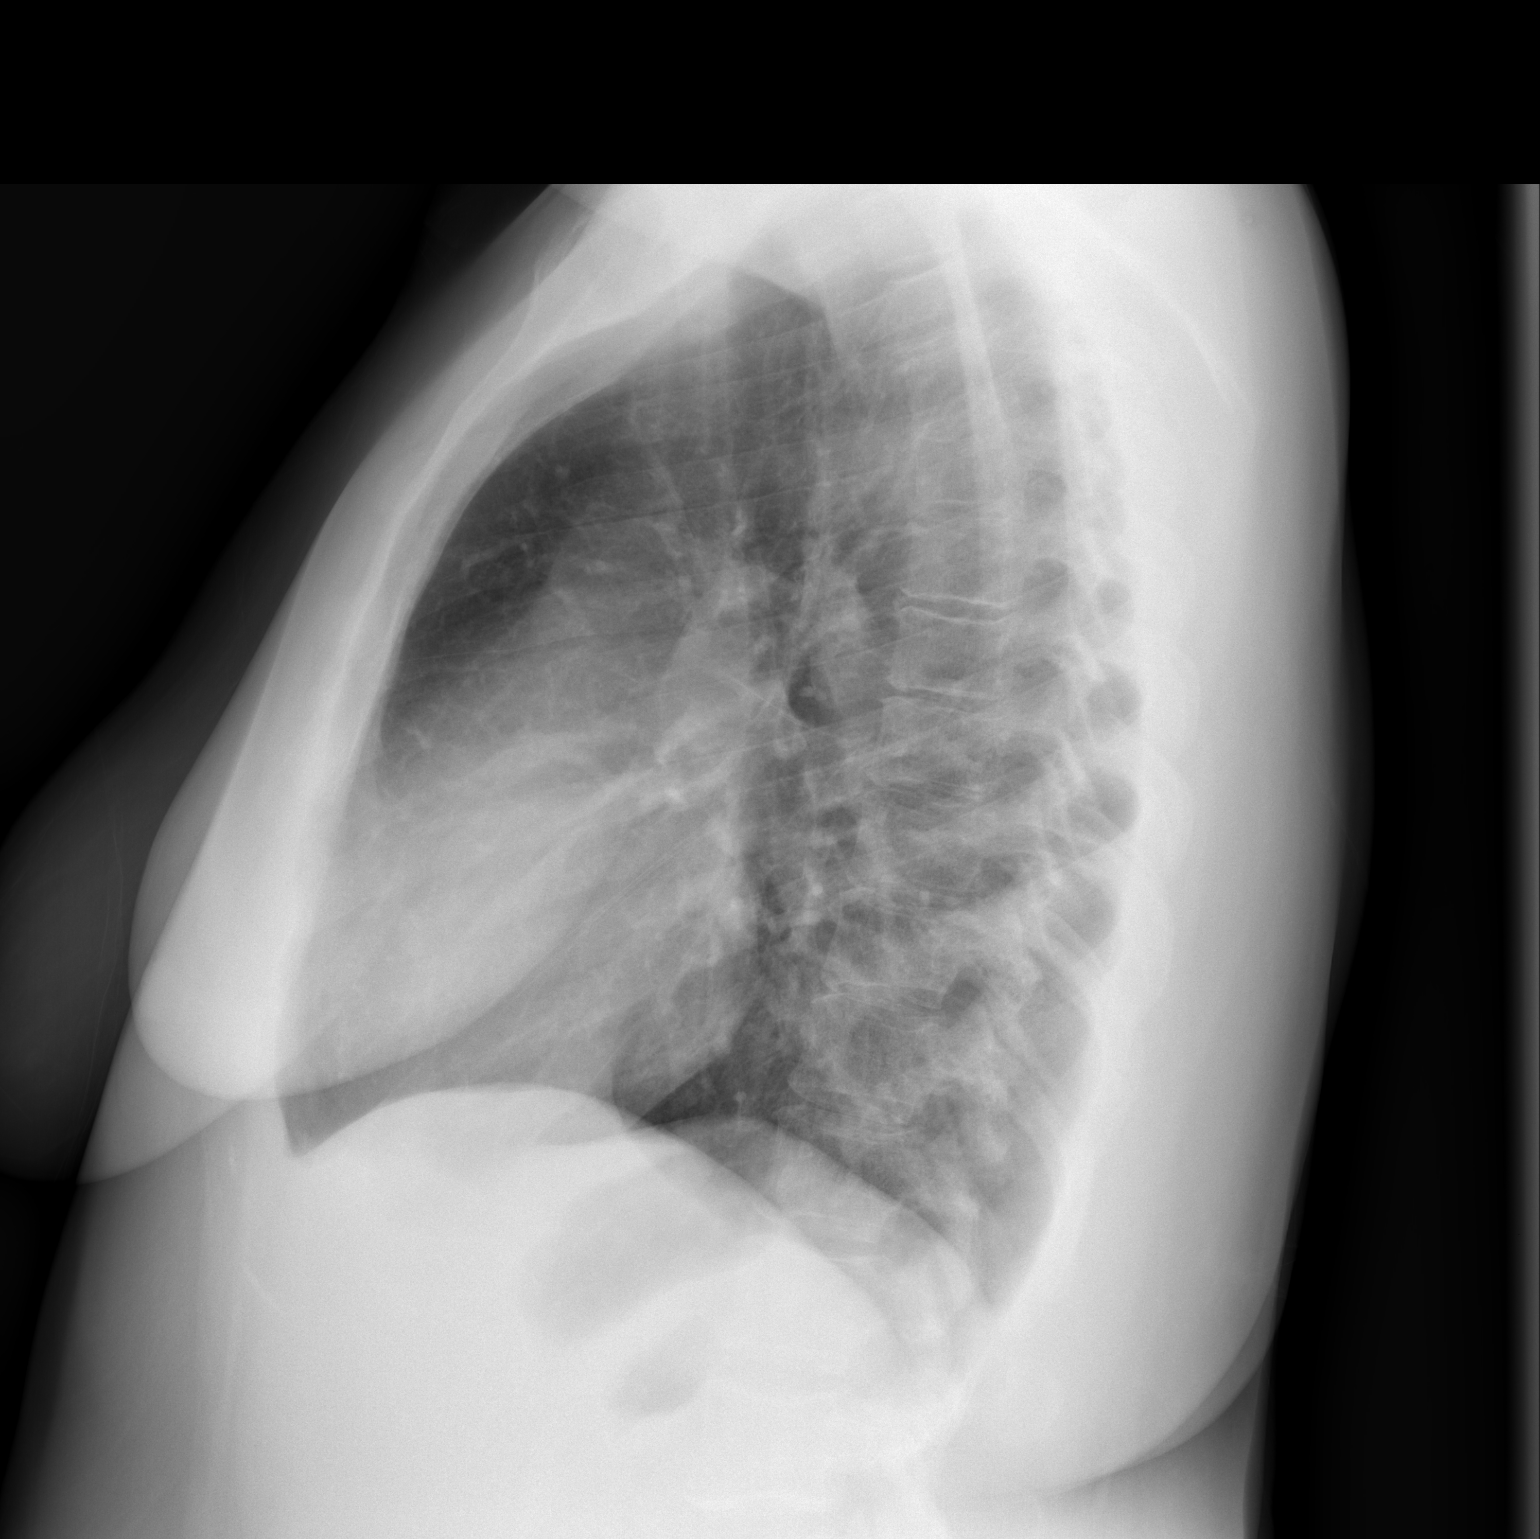

[2 of 2 positions shown; findings below may reference images not displayed]

FINDINGS: The lungs are clear bilaterally.  No confluent airspace
opacities or pleural effusions are seen.  The heart is normal in
size.  The upper abdomen and osseous structures are also normal.
IMPRESSION: No acute cardiopulmonary disease.

## 2009-08-17 ENCOUNTER — Ambulatory Visit (HOSPITAL_COMMUNITY): Payer: Self-pay | Admitting: Marriage and Family Therapist

## 2009-08-21 ENCOUNTER — Encounter (INDEPENDENT_AMBULATORY_CARE_PROVIDER_SITE_OTHER): Payer: Self-pay | Admitting: General Surgery

## 2009-08-21 ENCOUNTER — Ambulatory Visit (HOSPITAL_COMMUNITY): Admission: RE | Admit: 2009-08-21 | Discharge: 2009-08-22 | Payer: Self-pay | Admitting: General Surgery

## 2009-08-24 ENCOUNTER — Ambulatory Visit (HOSPITAL_COMMUNITY): Payer: Self-pay | Admitting: Marriage and Family Therapist

## 2009-09-07 ENCOUNTER — Ambulatory Visit (HOSPITAL_COMMUNITY): Payer: Self-pay | Admitting: Marriage and Family Therapist

## 2009-09-12 ENCOUNTER — Ambulatory Visit (HOSPITAL_COMMUNITY): Payer: Self-pay | Admitting: Marriage and Family Therapist

## 2009-09-13 ENCOUNTER — Encounter: Payer: Self-pay | Admitting: Internal Medicine

## 2009-09-21 ENCOUNTER — Ambulatory Visit (HOSPITAL_COMMUNITY): Payer: Self-pay | Admitting: Marriage and Family Therapist

## 2009-09-28 ENCOUNTER — Ambulatory Visit (HOSPITAL_COMMUNITY): Payer: Self-pay | Admitting: Marriage and Family Therapist

## 2009-09-29 ENCOUNTER — Emergency Department (HOSPITAL_COMMUNITY): Admission: EM | Admit: 2009-09-29 | Discharge: 2009-09-29 | Payer: Self-pay | Admitting: Emergency Medicine

## 2009-10-05 ENCOUNTER — Ambulatory Visit (HOSPITAL_COMMUNITY): Payer: Self-pay | Admitting: Marriage and Family Therapist

## 2009-10-24 ENCOUNTER — Telehealth: Payer: Self-pay | Admitting: *Deleted

## 2009-10-26 ENCOUNTER — Ambulatory Visit (HOSPITAL_COMMUNITY): Payer: Self-pay | Admitting: Marriage and Family Therapist

## 2009-10-31 ENCOUNTER — Ambulatory Visit (HOSPITAL_COMMUNITY): Payer: Self-pay | Admitting: Marriage and Family Therapist

## 2009-11-09 ENCOUNTER — Ambulatory Visit (HOSPITAL_COMMUNITY): Payer: Self-pay | Admitting: Marriage and Family Therapist

## 2009-11-13 ENCOUNTER — Ambulatory Visit: Payer: Self-pay | Admitting: Internal Medicine

## 2009-11-13 ENCOUNTER — Telehealth: Payer: Self-pay | Admitting: Internal Medicine

## 2009-11-13 DIAGNOSIS — R519 Headache, unspecified: Secondary | ICD-10-CM | POA: Insufficient documentation

## 2009-11-13 DIAGNOSIS — R51 Headache: Secondary | ICD-10-CM

## 2009-11-16 ENCOUNTER — Ambulatory Visit (HOSPITAL_COMMUNITY): Payer: Self-pay | Admitting: Marriage and Family Therapist

## 2009-11-28 ENCOUNTER — Encounter: Payer: Self-pay | Admitting: Internal Medicine

## 2009-11-30 ENCOUNTER — Ambulatory Visit (HOSPITAL_COMMUNITY): Payer: Self-pay | Admitting: Marriage and Family Therapist

## 2009-12-04 ENCOUNTER — Telehealth: Payer: Self-pay | Admitting: Internal Medicine

## 2009-12-19 ENCOUNTER — Ambulatory Visit (HOSPITAL_COMMUNITY): Payer: Self-pay | Admitting: Marriage and Family Therapist

## 2009-12-28 ENCOUNTER — Ambulatory Visit (HOSPITAL_COMMUNITY): Payer: Self-pay | Admitting: Marriage and Family Therapist

## 2009-12-29 ENCOUNTER — Encounter: Payer: Self-pay | Admitting: Internal Medicine

## 2009-12-29 ENCOUNTER — Ambulatory Visit: Payer: Self-pay | Admitting: Internal Medicine

## 2009-12-29 LAB — CONVERTED CEMR LAB
Amphetamine Screen, Ur: NEGATIVE
Barbiturate Quant, Ur: NEGATIVE
Marijuana Metabolite: POSITIVE — AB
Methadone: NEGATIVE
Opiates: NEGATIVE
Propoxyphene: NEGATIVE

## 2010-01-04 ENCOUNTER — Ambulatory Visit (HOSPITAL_COMMUNITY): Payer: Self-pay | Admitting: Marriage and Family Therapist

## 2010-01-09 ENCOUNTER — Encounter: Payer: Self-pay | Admitting: Internal Medicine

## 2010-01-11 ENCOUNTER — Telehealth: Payer: Self-pay | Admitting: Internal Medicine

## 2010-01-11 ENCOUNTER — Ambulatory Visit (HOSPITAL_COMMUNITY): Payer: Self-pay | Admitting: Marriage and Family Therapist

## 2010-01-18 ENCOUNTER — Ambulatory Visit (HOSPITAL_COMMUNITY): Payer: Self-pay | Admitting: Marriage and Family Therapist

## 2010-01-23 ENCOUNTER — Ambulatory Visit (HOSPITAL_COMMUNITY): Payer: Self-pay | Admitting: Psychiatry

## 2010-01-23 ENCOUNTER — Ambulatory Visit: Payer: Self-pay

## 2010-01-24 ENCOUNTER — Ambulatory Visit (HOSPITAL_COMMUNITY): Payer: Self-pay | Admitting: Marriage and Family Therapist

## 2010-01-30 ENCOUNTER — Ambulatory Visit (HOSPITAL_COMMUNITY): Admit: 2010-01-30 | Payer: Self-pay | Admitting: Marriage and Family Therapist

## 2010-02-06 ENCOUNTER — Ambulatory Visit: Admit: 2010-02-06 | Payer: Self-pay

## 2010-02-08 ENCOUNTER — Ambulatory Visit (HOSPITAL_COMMUNITY)
Admission: RE | Admit: 2010-02-08 | Discharge: 2010-02-08 | Payer: Self-pay | Source: Home / Self Care | Attending: Marriage and Family Therapist | Admitting: Marriage and Family Therapist

## 2010-02-15 ENCOUNTER — Ambulatory Visit (HOSPITAL_COMMUNITY): Admit: 2010-02-15 | Payer: Self-pay | Admitting: Marriage and Family Therapist

## 2010-02-18 ENCOUNTER — Encounter: Payer: Self-pay | Admitting: Unknown Physician Specialty

## 2010-02-22 ENCOUNTER — Ambulatory Visit (HOSPITAL_COMMUNITY): Admit: 2010-02-22 | Payer: Self-pay | Admitting: Marriage and Family Therapist

## 2010-02-22 ENCOUNTER — Ambulatory Visit (HOSPITAL_COMMUNITY)
Admission: RE | Admit: 2010-02-22 | Discharge: 2010-02-22 | Payer: Self-pay | Source: Home / Self Care | Attending: Psychiatry | Admitting: Psychiatry
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
## 2010-02-27 NOTE — Progress Notes (Signed)
Summary: med change/gp  Phone Note Refill Request Message from:  Fax from Pharmacy on Jun 27, 2009 10:36 AM  Refills Requested: Medication #1:  LIDOCAINE HCL 3 % CREA apply on hemorroids area as needed Lidocaine 3% cream is not availabe; but can get Gel 2% or Ointment 5%.   Thanks   Method Requested: Electronic Initial call taken by: Chinita Pester RN,  Jun 27, 2009 10:38 AM  Follow-up for Phone Call        I have changed the prescription to something that should work better.  Additional Follow-up for Phone Call Additional follow up Details #1::        Rx faxed to pharmacy Additional Follow-up by: Angelina Ok RN,  June 30, 2009 12:38 PM    New/Updated Medications: PROCTOZONE-HC 2.5 % CREA (HYDROCORTISONE) Apply as directed Prescriptions: PROCTOZONE-HC 2.5 % CREA (HYDROCORTISONE) Apply as directed  #1 tube x 1   Entered and Authorized by:   Elby Showers MD   Signed by:   Elby Showers MD on 06/29/2009   Method used:   [facility]        [facility] [address] [phone]* (retail)       [address]       [place], [place]  [address]       Ph: [phone] or [phone]       Fax: [phone]   RxID:   [id number]

## 2010-02-27 NOTE — Progress Notes (Signed)
Summary: refill/gg  Phone Note Refill Request  on March 21, 2009 4:39 PM  Refills Requested: Medication #1:  PROMETHAZINE HCL 12.5 MG TABS take 1-2 tablets every 6 hours as needed for for nausea   Dosage confirmed as above?Dosage Confirmed   Brand Name Necessary? No   Supply Requested: 3 months   Last Refilled: 02/24/2009  Method Requested: Electronic Initial call taken by: Merrie Roof RN,  March 21, 2009 4:39 PM    Prescriptions: PROMETHAZINE HCL 12.5 MG TABS (PROMETHAZINE HCL) take 1-2 tablets every 6 hours as needed for for nausea  #60 x 3   Entered and Authorized by:   Nilda Riggs MD   Signed by:   Nilda Riggs MD on 03/21/2009   Method used:   [facility]        [facility] [address] [phone]* (retail)       [address]       [place], [place]  [address]       Ph: [phone] or [phone]       Fax: [phone]   RxID:   [id number]

## 2010-02-27 NOTE — Progress Notes (Signed)
Summary: Refill/gh  Phone Note Refill Request Message from:  Patient on August 16, 2009 4:10 PM  Refills Requested: Medication #1:  PROMETHAZINE HCL 25 MG TABS Take 1 tablet by mouth four times a day as needed for nausea  Medication #2:  PROCTOZONE-HC 2.5 % CREA Apply as directed  Medication #3:  Vicodin 5/500 mg. Wants something for pain as well.  Has surgery scheduled for next week on Monday for her Hemmorrhoids.   Phone disconnected before end of conversation.   Method Requested: Electronic Initial call taken by: Angelina Ok RN,  August 16, 2009 4:10 PM  Follow-up for Phone Call        Why is she asking for vicodin, has she got vocodin before ?  Follow-up by: Bethel Born MD,  August 17, 2009 8:51 AM    Prescriptions: PROCTOZONE-HC 2.5 % CREA (HYDROCORTISONE) Apply as directed  #1 tube x 1   Entered and Authorized by:   Bethel Born MD   Signed by:   Bethel Born MD on 08/17/2009   Method used:   [facility]        [facility]  [address] [phone]* (retail)       [address]       [place], [place]  [address]       Ph: [phone] or [phone]       Fax: [phone]   RxID:   [id number] PROMETHAZINE HCL 25 MG TABS (PROMETHAZINE HCL) Take 1 tablet by mouth four times a day as needed for nausea  #25 x 1   Entered and Authorized by:   Bethel Born MD   Signed by:   Bethel Born MD on 08/17/2009   Method used:   [facility]ronically to        [facility]  [address] [phone]* (retail)       [address]       [place], [place]  [address]       Ph: [phone] or [phone]       Fax: [phone]   RxID:   [id number]   Appended Document: Refill/gh Vicodin was prescribed in the ER gor the abscess/growth in her groin as well as the Hemmorrhoids.  Both of which are going to be operated on on Monday.   Appended Document: Refill/gh ED records reiveed, patient was given vicodin for pain from hemorrhoids and  buttock abcess untill she gets the surgery.    Clinical Lists Changes  Medications: Added new medication of VICODIN 5-500 MG TABS (HYDROCODONE-ACETAMINOPHEN) 1 tab every 6 hrs as needed for pain - Signed Rx of VICODIN 5-500 MG TABS (HYDROCODONE-ACETAMINOPHEN) 1 tab every 6 hrs as needed for pain;  #20 x 0;  Signed;  Entered by: Bethel Born MD;  Authorized by: Bethel Born MD;  Method used: [facility] [address] [phone]*, [address] [place], [place], [place]  [address], Ph: [phone] or [phone], Fax: [phone]    Prescriptions: VICODIN 5-500 MG TABS (HYDROCODONE-ACETAMINOPHEN) 1 tab every 6 hrs as needed for pain  #20 x 0   Entered and Authorized by:   Bethel Born MD   Signed by:   Bethel Born MD on 08/17/2009   Method used:   Telephoned to ...       [facility]  [address] [phone]* (retail)       [address]  [place], [place]  [address]       Ph: [phone] or [phone]       Fax: [phone]   RxID:   [id number]

## 2010-02-27 NOTE — Progress Notes (Signed)
Summary: phone/gg  Phone Note Call from Patient   Caller: Patient Summary of Call: Pt called with c/o headache for several weeks.  She does not have her insurance card yet so can't be seen at headache clinic. Pt wants appointment tomorrow but clinic full this week.  Pt advised to go to ED for evaluation of headache, also call us tomorrow for appointment next week. Patient/caller verbalizes understanding of these instructions.  Initial call taken by: Merrie Roof RN,  October 24, 2009 4:21 PM

## 2010-02-27 NOTE — Miscellaneous (Signed)
Summary: [facility]  [facility]   Imported By: Shon Hough 09/28/2009 12:33:56  _____________________________________________________________________  External Attachment:    Type:   Image     Comment:   External Document

## 2010-02-27 NOTE — Consult Note (Signed)
Summary: [facility]   [facility]   Imported By: Florinda Marker 02/02/2009 14:20:28  _____________________________________________________________________  External Attachment:    Type:   Image     Comment:   External Document

## 2010-02-27 NOTE — Progress Notes (Signed)
Summary: Headaches  Phone Note Call from Patient   Caller: Patient Call For: Bethel Born MD Summary of Call: Call from pt c/o severe headaches.  Hoarseness.  Fevers.  Wants to be seen.  Pt was given an appointment for this am.Gladys Herbin RN  November 13, 2009 10:11 AM  Initial call taken by: Angelina Ok RN,  November 13, 2009 10:11 AM  Follow-up for Phone Call        Noted Follow-up by: Blanch Media MD,  November 13, 2009 10:24 AM

## 2010-02-27 NOTE — Progress Notes (Signed)
Summary: Refill/gh  Phone Note Refill Request Message from:  Fax from Pharmacy on December 04, 2009 11:01 AM  Refills Requested: Medication #1:  PROMETHAZINE HCL 12.5 MG TABS 1 tab every 6 hrs as needed for nausea. Pt is now taking 25 mg of the Promethazine.  Said that she called about increasing the medication.  Pt is taking medications on an empty stomach and this makes her sick.  Goes to [employer].   Taking the extra dosage hepls het to keep the meds and food down.    Method Requested: Electronic Initial call taken by: Angelina Ok RN,  December 04, 2009 11:04 AM  Follow-up for Phone Call        This patient will need to be seen-I cannot find anywhere in her medical record about underlying cause of chronic nausea or why it was started in the first place. Denied script until evaluation can be done- its probably not a good idea to just treat symptoms and not figure out the underlying cause. I am not infavor of giving her anti-nausea medicine so she can take other medicine unless it is lifesaving medicine and there are no other options. Follow-up by: Julaine Fusi  DO,  December 04, 2009 11:53 AM

## 2010-02-27 NOTE — Progress Notes (Signed)
Summary: phone/gg  Phone Note Call from Patient   Summary of Call: Pt was seen here today and given 2 Rx for promethazine and bentyl He went to ED after visit here.  He lost both Rx's and wants refill. Initial call taken by: Merrie Roof RN,  April 11, 2009 5:23 PM  Follow-up for Phone Call        please call inrx  thanks Follow-up by: Clydie Braun MD,  April 11, 2009 5:26 PM  Additional Follow-up for Phone Call Additional follow up Details #1::        meds called in Additional Follow-up by: Merrie Roof RN,  April 11, 2009 5:31 PM

## 2010-02-27 NOTE — Consult Note (Signed)
Summary: [facility]  [facility]   Imported By: Louretta Parma 12/01/2009 15:29:25  _____________________________________________________________________  External Attachment:    Type:   Image     Comment:   External Document

## 2010-02-27 NOTE — Miscellaneous (Signed)
Summary: [facility]  [facility]   Imported By: Shon Hough 08/23/2009 12:13:27  _____________________________________________________________________  External Attachment:    Type:   Image     Comment:   External Document

## 2010-02-27 NOTE — Medication Information (Signed)
Summary: CONTROLLED MEDICATION   CONTROLLED MEDICATION   Imported By: Margie Billet 01/02/2010 11:28:06  _____________________________________________________________________  External Attachment:    Type:   Image     Comment:   External Document

## 2010-02-27 NOTE — Assessment & Plan Note (Signed)
Summary: ER/FU-FALL HURT FINGERS AND TOE HARD TO HOLD THINGS/(EVANS)/[facility]   Vital Signs:  Patient profile:   40 year old female Height:      70.5 inches (179.07 cm) Weight:      222.8 pounds (101.27 kg) BMI:     31.63 Temp:     97.8 degrees F (36.56 degrees C) oral Pulse rate:   75 / minute BP sitting:   124 / 73  (right arm)  Vitals Entered By: Stanton Kidney Ditzler RN (Jun 21, 2009 9:43 AM) Is Patient Diabetic? No Pain Assessment Patient in pain? yes     Location: stomach,fingers and toes Intensity: 10 Type: sharp Onset of pain  past 4 weeks Nutritional Status BMI of > 30 = obese Nutritional Status Detail appetite fair  Have you ever been in a relationship where you felt threatened, hurt or afraid?denies   Does patient need assistance? Functional Status Self care Ambulation Normal Comments ER FU - lost balance and fell aganist TV. Still having problems with hands and feet.   Primary Care Provider:  Nilda Riggs MD   History of Present Illness: 38yof with pmh outlined in chart here for follow up after a fall.  Per the ED note she fell on 05/29/09 (3 weeks ago) when she tripped at home.  There was no LOC.  She had injury to the right hand, foot and ankle.  xrays at that time were negative for fracture. She comes in today with complaint of continued pain in these sites.  She also has questions about who we have refered her to so that she can make her own appointments.  She says that her schedule is very complicated taking care of her disabled daughter and it would be easier for her if she made these appontments herself.  Depression History:      The patient denies a depressed mood most of the day and a diminished interest in her usual daily activities.         Preventive Screening-Counseling & Management  Alcohol-Tobacco     Alcohol drinks/day: 0     Smoking Status: current     Smoking Cessation Counseling: yes     Packs/Day: 2cigs perday     Year Started: smoking x 9  yrs  Caffeine-Diet-Exercise     Does Patient Exercise: no  Current Medications (verified): 1)  Neurontin 800 Mg Tabs (Gabapentin) .... Take 1 Tablet By Mouth Three Times A Day 2)  Clorazepate Dipotassium 15 Mg Tabs (Clorazepate Dipotassium) .... Take 1 Tablet By Mouth Three Times A Day 3)  Lidocaine Hcl 3 % Crea (Lidocaine Hcl) .... Apply On Hemorroids Area As Needed 4)  Bentyl 20 Mg Tabs (Dicyclomine Hcl) .... Take 1 Tablet By Mouth Four Times A Day For Stomach Spasm 5)  Promethazine Hcl 25 Mg Tabs (Promethazine Hcl) .... Take 1 Tablet By Mouth Four Times A Day As Needed For Nausea  Allergies (verified): No Known Drug Allergies  Review of Systems       per hpi  Physical Exam  General:  alert and overweight-appearing.   Head:  normocephalic and atraumatic.   Lungs:  normal respiratory effort and normal breath sounds.   Heart:  normal rate, regular rhythm, and no murmur.   Msk:  right foot: no swelling, warmth or erythema.  No skin breaks.  pain in the lateral 4 toes, pain is with palpation or with any movement of these digits. right hand: no swelling, warmth or erythema.  no skin breaks.  pain  in the entire hand with palpation of the fingers (not the thumb) and with any motion of the fingers. Pulses:  2+ Extremities:  no edema Neurologic:  alert & oriented X3, cranial nerves II-XII intact, and gait normal.   Psych:  Oriented X3, memory intact for recent and remote, depressed affect, withdrawn, and poor eye contact.     Impression & Recommendations:  Problem # 1:  SCREENING FOR MALIGNANT NEOPLASM OF THE CERVIX (ICD-V76.2) hx of ASCUS pap in 2009 says that she went to [facility] hospital to follow this up, and she continues to go there.  Most recent pap 05/2008 was normal. continues to have recurrent BV, following at gyn clinic  Problem # 2:  HYPERTENSION (ICD-401.9) BP is great today  BP today: 124/73 Prior BP: 126/88 (04/11/2009)  Labs Reviewed: K+: 4.2 (09/06/2008) Creat:  : 0.69 (09/06/2008)     Problem # 3:  UNSPECIFIED FALL (ICD-E888.9)  will rexray the hand, and foot in case there could be some hairline fractures that were missed on the xray imediatly after the fall.  if not would consider physical therapy so that she regains motion in the hand especially.  Looks like she has been keeping it imoblie most of the time and I am concerned she could develop some contracture or atrophy.  Prescription for dicolfenac.  Orders: Diagnostic X-Ray/Fluoroscopy (Diagnostic X-Ray/Flu)  Problem # 4:  EXTERNAL HEMORRHOIDS, THROMBOSED (ICD-455.4) complains of pain today.   she wants to make her own appointment with [facility] for hemrhoidectomy, referal is already in, she can make the appointment.  Complete Medication List: 1)  Neurontin 800 Mg Tabs (Gabapentin) .... Take 1 tablet by mouth three times a day 2)  Clorazepate Dipotassium 15 Mg Tabs (Clorazepate dipotassium) .... Take 1 tablet by mouth three times a day 3)  Lidocaine Hcl 3 % Crea (Lidocaine hcl) .... Apply on hemorroids area as needed 4)  Bentyl 20 Mg Tabs (Dicyclomine hcl) .... Take 1 tablet by mouth four times a day for stomach spasm 5)  Promethazine Hcl 25 Mg Tabs (Promethazine hcl) .... Take 1 tablet by mouth four times a day as needed for nausea 6)  Diclofenac Potassium 50 Mg Tabs (Diclofenac potassium) .... Take one tablet three times a day for hand and foot pain.  Patient Instructions: 1)  You will have xrays of your hand and foot to be sure there are no small fractures.  If the xrays are normal we will refer you to physical therapy. 2)  You have a new prescription for a medication for pain and inflamation.  Take this with food. Prescriptions: LIDOCAINE HCL 3 % CREA (LIDOCAINE HCL) apply on hemorroids area as needed  #1 tube x 1   Entered and Authorized by:   Elby Showers MD   Signed by:   Elby Showers MD on 06/21/2009   Method used:   Electronically to        [facility] [address] [phone]* (retail)        [address]       [place], [place]  [id number]       Ph: [phone] or [phone]       Fax: [phone]   RxID:   [id number] BENTYL 20 MG TABS (DICYCLOMINE HCL) Take 1 tablet by mouth four times a day for stomach spasm  #25 x 1   Entered and Authorized by:   Elby Showers MD   Signed by:   Santina Evans  Clent Ridges MD on 06/21/2009   Method used:   Electronically to        [facility] [address] [phone]* (retail)       [address]       [place], [place]  [id number]       Ph: [phone] or [phone]       Fax: [phone]   RxID:   [id number] DICLOFENAC POTASSIUM 50 MG TABS (DICLOFENAC POTASSIUM) Take one tablet three times a day for hand and foot pain.  #30 x 0   Entered and Authorized by:   Elby Showers MD   Signed by:   Elby Showers MD on 06/21/2009   Method used:   [facility]        [facility] [address] [phone]* (retail)       [address]       [place], [place]  [id number]       Ph: [phone] or [phone]       Fax: [phone]   RxID:   [id number]    Prevention & Chronic Care Immunizations   Influenza vaccine: Fluvax 3+  (11/23/2008)   Influenza vaccine deferral: Deferred  (04/11/2009)    Tetanus booster: Not documented   Td booster deferral: Deferred  (04/11/2009)    Pneumococcal vaccine: Not documented   Pneumococcal vaccine deferral: Not indicated  (04/11/2009)  Other Screening   Pap smear:  Specimen Adequacy: Satisfactory for evaluation.   Ancillary Testing: HPV Typing.    Epithelial Cell Abnormalities.  Squamous Cell: Atypical Squamous Cells of undetermined significance (ASC-US).  Location: [facility].    (06/16/2007)   Pap smear action/deferral: Not indicated-other  (11/23/2008)   Pap smear due: 12/2007   Smoking status: current  (06/21/2009)   Smoking cessation counseling: yes  (06/21/2009)  Lipids   Total  Cholesterol: Not documented   Lipid panel action/deferral: Lipid Panel ordered   LDL: Not documented   LDL Direct: Not documented   HDL: Not documented   Triglycerides: Not documented  Hypertension   Last Blood Pressure: 124 / 73  (06/21/2009)   Serum creatinine: 0.69  (09/06/2008)   Serum potassium 4.2  (09/06/2008)    Hypertension flowsheet reviewed?: Yes   Progress toward BP goal: At goal  Self-Management Support :   Personal Goals (by the next clinic visit) :      Personal blood pressure goal: 140/90  (10/18/2008)   Patient will work on the following items until the next clinic visit to reach self-care goals:     Medications and monitoring: take my medicines every day, bring all of my medications to every visit  (06/21/2009)     Eating: eat more vegetables, use fresh or frozen vegetables, eat foods that are low in salt, eat baked foods instead of fried foods, eat fruit for snacks and desserts, limit or avoid alcohol  (06/21/2009)     Activity: take a 30 minute walk every day  (06/21/2009)    Hypertension self-management support: Written self-care plan, Resources for patients handout  (06/21/2009)   Hypertension self-care plan printed.      Resource handout printed.
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
## 2010-03-01 ENCOUNTER — Ambulatory Visit (HOSPITAL_COMMUNITY): Admit: 2010-03-01 | Payer: Self-pay | Admitting: Psychiatry

## 2010-03-01 ENCOUNTER — Encounter (HOSPITAL_COMMUNITY): Payer: Self-pay | Admitting: Marriage and Family Therapist

## 2010-03-01 NOTE — Consult Note (Signed)
Summary: [facility] 07-07-2009/08-10-2009  [facility] 07-07-2009/08-10-2009   Imported By: Margie Billet 01/09/2010 14:39:01  _____________________________________________________________________  External Attachment:    Type:   Image     Comment:   External Document

## 2010-03-01 NOTE — Progress Notes (Signed)
Summary: refill/ hla  Phone Note Refill Request Message from:  Patient on January 11, 2010 2:35 PM  Refills Requested: Medication #1:  BENTYL 20 MG TABS Take 1 tablet by mouth four times a day for stomach spasm   Dosage confirmed as above?Dosage Confirmed Initial call taken by: Marin Roberts RN,  January 11, 2010 2:35 PM  Follow-up for Phone Call       Follow-up by: Bethel Born MD,  January 11, 2010 2:41 PM    Prescriptions: BENTYL 20 MG TABS (DICYCLOMINE HCL) Take 1 tablet by mouth four times a day for stomach spasm  #25 x 1   Entered and Authorized by:   Bethel Born MD   Signed by:   Bethel Born MD on 01/11/2010   Method used:   Electronically to        [facility]  [address] [phone]* (retail)       [address]       [place], [place]  [id number]       Ph: [phone] or [phone]       Fax: [phone]   RxID:   [id number]

## 2010-03-08 ENCOUNTER — Other Ambulatory Visit: Payer: Self-pay | Admitting: Internal Medicine

## 2010-03-08 ENCOUNTER — Encounter (HOSPITAL_COMMUNITY): Payer: Medicare Other | Admitting: Marriage and Family Therapist

## 2010-03-08 DIAGNOSIS — F339 Major depressive disorder, recurrent, unspecified: Secondary | ICD-10-CM

## 2010-03-08 DIAGNOSIS — F410 Panic disorder [episodic paroxysmal anxiety] without agoraphobia: Secondary | ICD-10-CM

## 2010-03-10 ENCOUNTER — Other Ambulatory Visit: Payer: Self-pay | Admitting: Internal Medicine

## 2010-03-10 DIAGNOSIS — K589 Irritable bowel syndrome without diarrhea: Secondary | ICD-10-CM

## 2010-03-10 MED ORDER — DICYCLOMINE HCL 20 MG PO TABS: 20.0000 mg | ORAL_TABLET | Freq: Three times a day (TID) | ORAL | Status: DC | PRN

## 2010-03-15 ENCOUNTER — Encounter (HOSPITAL_COMMUNITY): Payer: Medicare Other | Admitting: Marriage and Family Therapist

## 2010-03-15 DIAGNOSIS — F339 Major depressive disorder, recurrent, unspecified: Secondary | ICD-10-CM

## 2010-03-15 DIAGNOSIS — F410 Panic disorder [episodic paroxysmal anxiety] without agoraphobia: Secondary | ICD-10-CM

## 2010-03-22 ENCOUNTER — Encounter (HOSPITAL_COMMUNITY): Payer: Medicare Other | Admitting: Marriage and Family Therapist

## 2010-03-29 ENCOUNTER — Encounter (HOSPITAL_COMMUNITY): Payer: Medicare Other | Admitting: Marriage and Family Therapist

## 2010-03-29 DIAGNOSIS — F339 Major depressive disorder, recurrent, unspecified: Secondary | ICD-10-CM

## 2010-03-29 DIAGNOSIS — F410 Panic disorder [episodic paroxysmal anxiety] without agoraphobia: Secondary | ICD-10-CM

## 2010-04-05 ENCOUNTER — Encounter (HOSPITAL_COMMUNITY): Payer: Medicare Other | Admitting: Marriage and Family Therapist

## 2010-04-10 ENCOUNTER — Encounter (HOSPITAL_COMMUNITY): Payer: Medicare Other | Admitting: Marriage and Family Therapist

## 2010-04-12 ENCOUNTER — Encounter (HOSPITAL_COMMUNITY): Payer: Medicare Other | Admitting: Marriage and Family Therapist

## 2010-04-12 DIAGNOSIS — F339 Major depressive disorder, recurrent, unspecified: Secondary | ICD-10-CM

## 2010-04-12 DIAGNOSIS — F410 Panic disorder [episodic paroxysmal anxiety] without agoraphobia: Secondary | ICD-10-CM

## 2010-04-12 LAB — POCT RAPID STREP A (OFFICE): Streptococcus, Group A Screen (Direct): NEGATIVE

## 2010-04-14 LAB — BASIC METABOLIC PANEL
CO2: 25 mEq/L (ref 19–32)
Calcium: 8.9 mg/dL (ref 8.4–10.5)
Chloride: 108 mEq/L (ref 96–112)
GFR calc Af Amer: >60 mL/min (ref >60)
Glucose, Bld: 80 mg/dL (ref 70–99)
Potassium: 3.7 mEq/L (ref 3.5–5.1)
Sodium: 140 mEq/L (ref 135–145)

## 2010-04-14 LAB — DIFFERENTIAL
Eosinophils Absolute: 0 10*3/uL (ref 0.0–0.7)
Lymphs Abs: 2.4 10*3/uL (ref 0.7–4.0)
Monocytes Absolute: 0.5 10*3/uL (ref 0.1–1.0)
Monocytes Relative: 10 % (ref 3–12)
Neutrophils Relative %: 39 % — ABNORMAL LOW (ref 43–77)

## 2010-04-14 LAB — CBC
HCT: 41.3 % (ref 36.0–46.0)
Hemoglobin: 13.8 g/dL (ref 12.0–15.0)
MCH: 29 pg (ref 26.0–34.0)
RBC: 4.78 MIL/uL (ref 3.87–5.11)

## 2010-04-18 LAB — URINALYSIS, ROUTINE W REFLEX MICROSCOPIC
Glucose, UA: NEGATIVE mg/dL
Hgb urine dipstick: NEGATIVE
Ketones, ur: NEGATIVE mg/dL
pH: 5.5 (ref 5.0–8.0)

## 2010-04-18 LAB — WET PREP, GENITAL: Trich, Wet Prep: NONE SEEN

## 2010-04-18 LAB — POCT PREGNANCY, URINE: Preg Test, Ur: NEGATIVE

## 2010-04-19 ENCOUNTER — Encounter (HOSPITAL_COMMUNITY): Payer: Medicare Other | Admitting: Marriage and Family Therapist

## 2010-04-19 DIAGNOSIS — F339 Major depressive disorder, recurrent, unspecified: Secondary | ICD-10-CM

## 2010-04-19 DIAGNOSIS — F410 Panic disorder [episodic paroxysmal anxiety] without agoraphobia: Secondary | ICD-10-CM

## 2010-04-23 LAB — COMPREHENSIVE METABOLIC PANEL
ALT: 16 U/L (ref 0–35)
AST: 24 U/L (ref 0–37)
Albumin: 4.2 g/dL (ref 3.5–5.2)
Alkaline Phosphatase: 57 U/L (ref 39–117)
CO2: 20 mEq/L (ref 19–32)
Chloride: 108 mEq/L (ref 96–112)
Creatinine, Ser: 0.86 mg/dL (ref 0.4–1.2)
GFR calc Af Amer: >60 mL/min (ref >60)
GFR calc non Af Amer: >60 mL/min (ref >60)
Potassium: 3.6 mEq/L (ref 3.5–5.1)
Total Bilirubin: 0.9 mg/dL (ref 0.3–1.2)

## 2010-04-23 LAB — URINE MICROSCOPIC-ADD ON

## 2010-04-23 LAB — URINALYSIS, ROUTINE W REFLEX MICROSCOPIC
Glucose, UA: NEGATIVE mg/dL
Ketones, ur: 40 mg/dL — AB
pH: 6 (ref 5.0–8.0)

## 2010-04-23 LAB — CBC
MCHC: 33.5 g/dL (ref 30.0–36.0)
RBC: 5.15 MIL/uL — ABNORMAL HIGH (ref 3.87–5.11)
WBC: 5 10*3/uL (ref 4.0–10.5)

## 2010-04-24 ENCOUNTER — Other Ambulatory Visit (INDEPENDENT_AMBULATORY_CARE_PROVIDER_SITE_OTHER): Payer: Medicare Other | Admitting: *Deleted

## 2010-04-24 ENCOUNTER — Telehealth: Payer: Self-pay | Admitting: *Deleted

## 2010-04-24 DIAGNOSIS — K589 Irritable bowel syndrome without diarrhea: Secondary | ICD-10-CM

## 2010-04-24 MED ORDER — PROMETHAZINE HCL 12.5 MG PO TABS: 12.5000 mg | ORAL_TABLET | Freq: Four times a day (QID) | ORAL | Status: DC | PRN

## 2010-04-24 NOTE — Telephone Encounter (Signed)
Pt c/o stomach spasms and nausea.  Pt is out of phenergan. She is having abd pain every time she eats or drinks. Onset 1 week ago, pain increasing.  She has Hx IBS  Appointment offered today but pt is unable to come in  Appointment given for tomorrow but pt asked to go to ED  If any changes  Or increase pain. Pt voices understanding. Chilon will schedule.

## 2010-04-25 ENCOUNTER — Emergency Department (HOSPITAL_COMMUNITY): Payer: Medicare Other

## 2010-04-25 ENCOUNTER — Emergency Department (HOSPITAL_COMMUNITY)
Admission: EM | Admit: 2010-04-25 | Discharge: 2010-04-25 | Disposition: A | Discharge status: Home / Self Care | Payer: Medicare Other | Attending: Emergency Medicine | Admitting: Emergency Medicine

## 2010-04-25 ENCOUNTER — Ambulatory Visit: Payer: Medicare Other | Admitting: Internal Medicine

## 2010-04-25 DIAGNOSIS — K589 Irritable bowel syndrome without diarrhea: Secondary | ICD-10-CM | POA: Insufficient documentation

## 2010-04-25 DIAGNOSIS — R109 Unspecified abdominal pain: Secondary | ICD-10-CM | POA: Insufficient documentation

## 2010-04-25 DIAGNOSIS — IMO0001 Reserved for inherently not codable concepts without codable children: Secondary | ICD-10-CM | POA: Insufficient documentation

## 2010-04-25 DIAGNOSIS — I10 Essential (primary) hypertension: Secondary | ICD-10-CM | POA: Insufficient documentation

## 2010-04-25 DIAGNOSIS — R197 Diarrhea, unspecified: Secondary | ICD-10-CM | POA: Insufficient documentation

## 2010-04-25 LAB — CBC
Hemoglobin: 14.4 g/dL (ref 12.0–15.0)
MCH: 28.9 pg (ref 26.0–34.0)
MCHC: 34.7 g/dL (ref 30.0–36.0)
RDW: 15.6 % — ABNORMAL HIGH (ref 11.5–15.5)

## 2010-04-25 LAB — LIPASE, BLOOD: Lipase: 19 U/L (ref 11–59)

## 2010-04-25 LAB — DIFFERENTIAL
Basophils Absolute: 0 10*3/uL (ref 0.0–0.1)
Basophils Relative: 0 % (ref 0–1)
Eosinophils Relative: 2 % (ref 0–5)
Monocytes Absolute: 0.9 10*3/uL (ref 0.1–1.0)
Monocytes Relative: 11 % (ref 3–12)
Neutro Abs: 4.1 10*3/uL (ref 1.7–7.7)

## 2010-04-25 LAB — POCT I-STAT, CHEM 8
BUN: 7 mg/dL (ref 6–23)
Calcium, Ion: 1.08 mmol/L — ABNORMAL LOW (ref 1.12–1.32)
Creatinine, Ser: 1 mg/dL (ref 0.4–1.2)
Glucose, Bld: 76 mg/dL (ref 70–99)
Hemoglobin: 16.3 g/dL — ABNORMAL HIGH (ref 12.0–15.0)
Sodium: 139 mEq/L (ref 135–145)
TCO2: 23 mmol/L (ref 0–100)

## 2010-04-25 LAB — COMPREHENSIVE METABOLIC PANEL
AST: 14 U/L (ref 0–37)
CO2: 26 mEq/L (ref 19–32)
Calcium: 8.9 mg/dL (ref 8.4–10.5)
Creatinine, Ser: 0.74 mg/dL (ref 0.4–1.2)
GFR calc Af Amer: >60 mL/min (ref >60)
GFR calc non Af Amer: >60 mL/min (ref >60)
Glucose, Bld: 71 mg/dL (ref 70–99)
Sodium: 138 mEq/L (ref 135–145)
Total Protein: 7.3 g/dL (ref 6.0–8.3)

## 2010-04-25 IMAGING — CR DG ABDOMEN ACUTE W/ 1V CHEST
3 series · 3 of 3 positions shown · non-contrast
Comparison: [DATE]

CLINICAL DATA: Cough/epigastric pain

ACUTE ABDOMEN SERIES (ABDOMEN 2 VIEW & CHEST 1 VIEW)

[w chest pa]
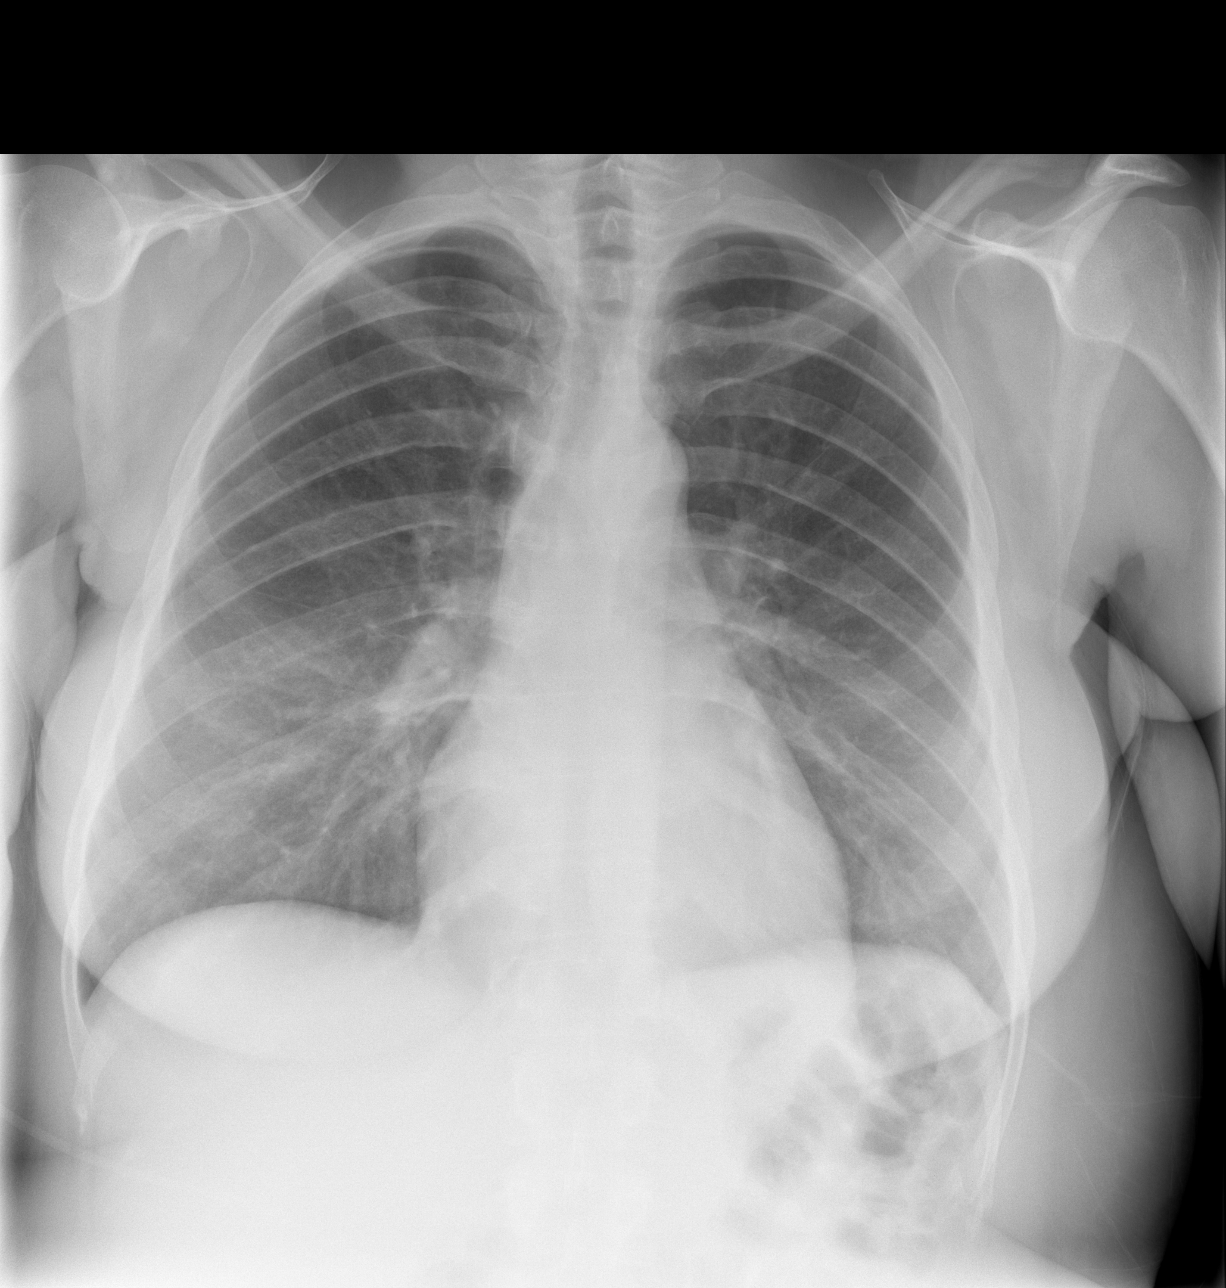

[w abdomen upright]
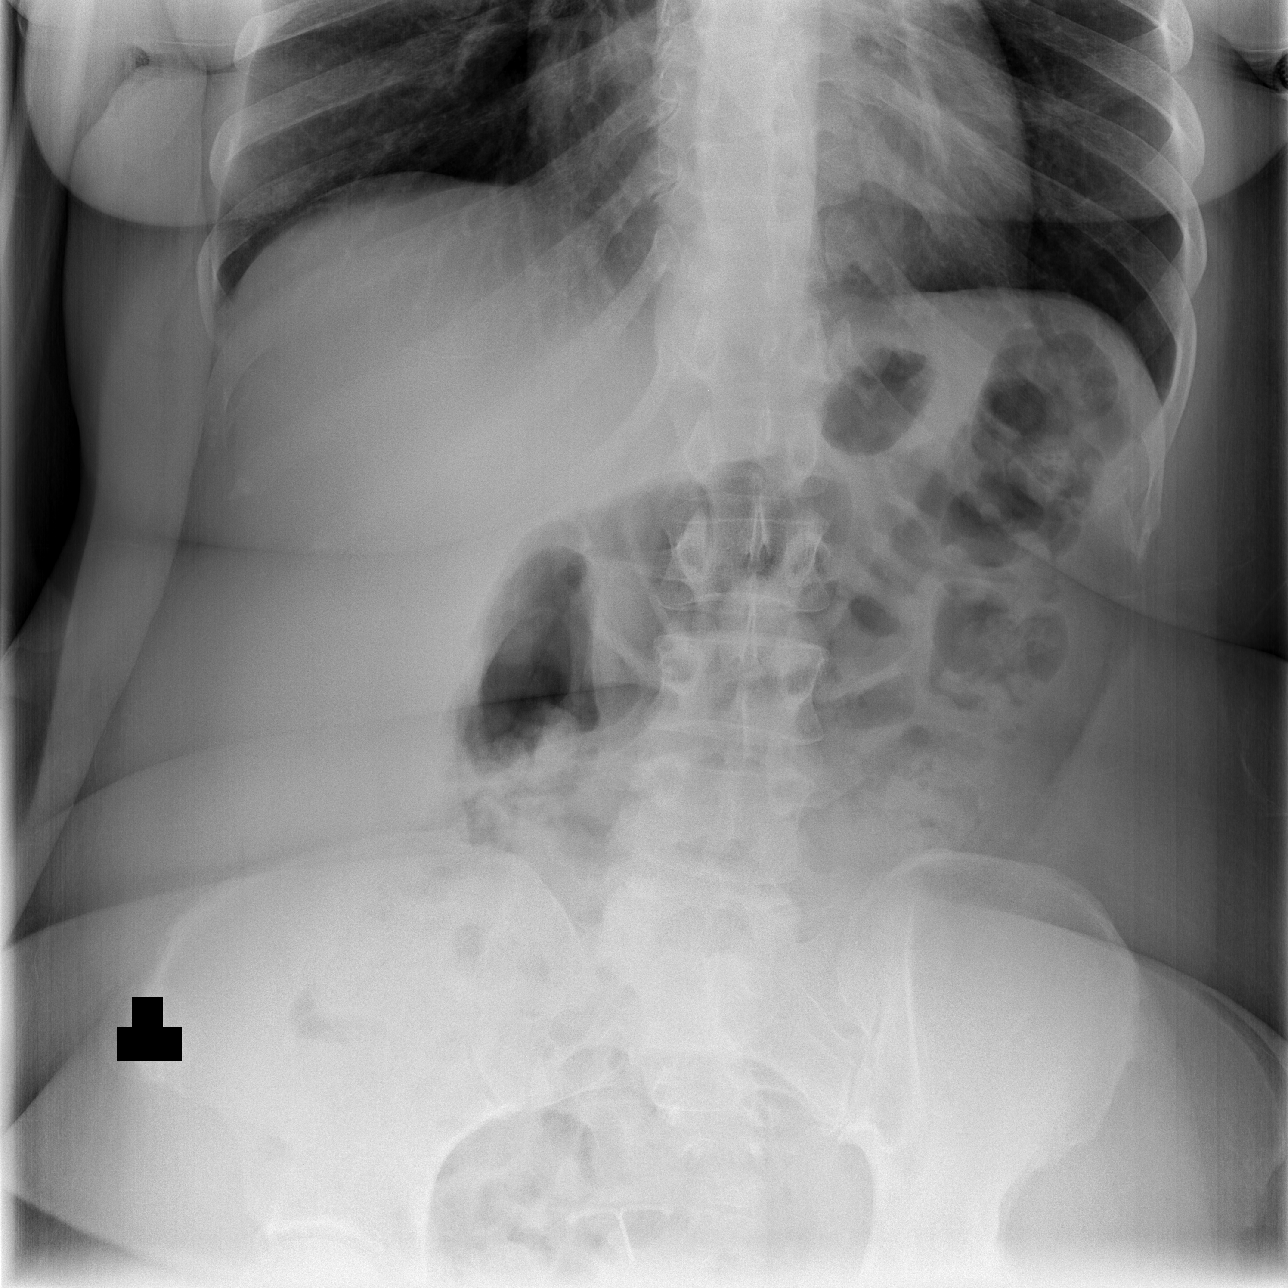

[t abdomen supine]
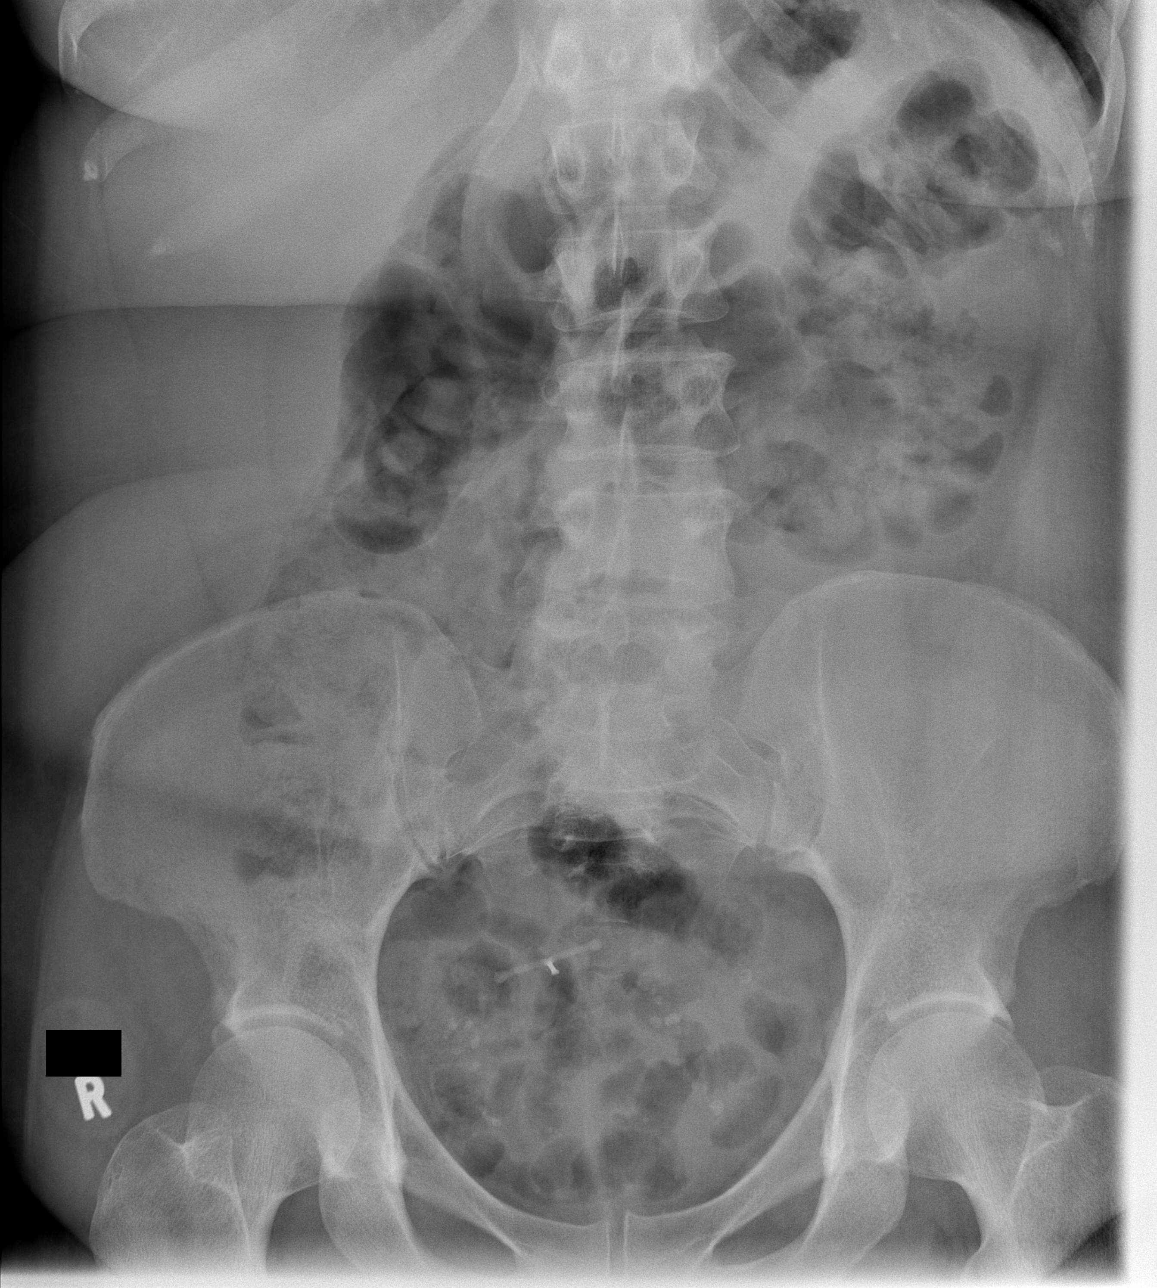

[3 of 3 positions shown; findings below may reference images not displayed]

FINDINGS: No active cardiopulmonary disease in one-view.  No free
air or acute/specific abnormality of the bowel gas pattern.  Psoas
margins intact.  No pathological calcifications.  IUD again noted.
IMPRESSION: 1.  No active cardiopulmonary disease in one-view.
2.  No acute or specific abdominal findings.

## 2010-04-26 ENCOUNTER — Encounter (HOSPITAL_COMMUNITY): Payer: Medicare Other | Admitting: Marriage and Family Therapist

## 2010-05-02 ENCOUNTER — Encounter (HOSPITAL_COMMUNITY): Payer: Medicare Other | Admitting: Marriage and Family Therapist

## 2010-05-03 ENCOUNTER — Telehealth: Payer: Self-pay | Admitting: Gastroenterology

## 2010-05-03 ENCOUNTER — Encounter: Payer: Self-pay | Admitting: Internal Medicine

## 2010-05-03 ENCOUNTER — Ambulatory Visit (INDEPENDENT_AMBULATORY_CARE_PROVIDER_SITE_OTHER): Payer: Medicare Other | Admitting: Internal Medicine

## 2010-05-03 DIAGNOSIS — K589 Irritable bowel syndrome without diarrhea: Secondary | ICD-10-CM

## 2010-05-03 DIAGNOSIS — G43909 Migraine, unspecified, not intractable, without status migrainosus: Secondary | ICD-10-CM

## 2010-05-03 DIAGNOSIS — R51 Headache: Secondary | ICD-10-CM

## 2010-05-03 DIAGNOSIS — R5383 Other fatigue: Secondary | ICD-10-CM

## 2010-05-03 DIAGNOSIS — R5381 Other malaise: Secondary | ICD-10-CM

## 2010-05-03 LAB — POCT URINALYSIS DIP (DEVICE)
Glucose, UA: NEGATIVE mg/dL
Protein, ur: NEGATIVE mg/dL
Urobilinogen, UA: 0.2 mg/dL (ref 0.0–1.0)

## 2010-05-03 LAB — T4, FREE: Free T4: 0.92 ng/dL (ref 0.80–1.80)

## 2010-05-03 LAB — TSH: TSH: 0.782 u[IU]/mL (ref 0.350–4.500)

## 2010-05-03 MED ORDER — TRAMADOL HCL 50 MG PO TABS: 50.0000 mg | ORAL_TABLET | Freq: Four times a day (QID) | ORAL | Status: DC | PRN

## 2010-05-03 MED ORDER — PROMETHAZINE HCL 25 MG PO TABS: 25.0000 mg | ORAL_TABLET | Freq: Four times a day (QID) | ORAL | Status: DC | PRN

## 2010-05-03 MED ORDER — DICYCLOMINE HCL 20 MG PO TABS: 20.0000 mg | ORAL_TABLET | Freq: Four times a day (QID) | ORAL | Status: DC

## 2010-05-03 MED ORDER — LOPERAMIDE HCL 2 MG PO TABS: 2.0000 mg | ORAL_TABLET | Freq: Four times a day (QID) | ORAL | Status: AC | PRN

## 2010-05-03 MED ORDER — HYDROCODONE-ACETAMINOPHEN 5-500 MG PO TABS: 2.0000 | ORAL_TABLET | Freq: Four times a day (QID) | ORAL | Status: DC | PRN

## 2010-05-03 MED ORDER — TOPIRAMATE 25 MG PO CPSP: 25.0000 mg | ORAL_CAPSULE | Freq: Two times a day (BID) | ORAL | Status: DC

## 2010-05-03 NOTE — Progress Notes (Signed)
  Subjective:    Patient ID: Lindsay [name], female    DOB: 1971-01-10, 40 y.o.   MRN: [id number]  HPI Patient is a 40 year old female with past medical history significant for irritable bowel syndrome has been having a flare for the past 2 weeks she has been seen by the emergency room physicians must come and have told her to followup with her GI and primary care doctor's. Today she complains of persistence symptoms of abdominal pain related to her irritable bowel syndrome, with 3-12 bowel movements a day but mucousy stools. Patient would like a referral back to Dr. Christella Hartigan for [facility] for further management. Patient also complains of migraine headaches and requests refills on her medications. Patient currently denies fevers chills night sweats.    Review of Systems  [all other systems reviewed and are negative       Objective:   Physical Exam  Constitutional: She is oriented to person, place, and time. She appears well-developed and well-nourished.  HENT:  Head: Normocephalic and atraumatic.  Eyes: Pupils are equal, round, and reactive to light.  Neck: Normal range of motion. Neck supple.  Cardiovascular: Normal rate, regular rhythm and normal heart sounds.   Pulmonary/Chest: Effort normal and breath sounds normal.  Abdominal: Soft. Bowel sounds are normal. She exhibits no distension and no mass. There is tenderness. There is no rebound and no guarding.  Musculoskeletal: Normal range of motion.  Neurological: She is alert and oriented to person, place, and time.  Skin: Skin is warm and dry.          Assessment & Plan:

## 2010-05-03 NOTE — Assessment & Plan Note (Addendum)
Patient complains of persistent migraines, and this chronic issue, I will give the patient a prescription of Topamax along with a prescription of tramadol, and 20 pills of Vicodin for severe pain.

## 2010-05-03 NOTE — Patient Instructions (Signed)
Irritable Bowel Syndrome You have a problem with your large bowel. This is called irritable bowel syndrome or spastic colon. It can cause cramping in the lower belly. You may also have more rumbling, gurgling, and bloating. Diarrhea and mucus in the stool are common. Constipation with hard small stools is also common. The pain will often recede after a bowel movement. This problem can come and go for months or years. The cause is unknown. A poor diet and emotional stress can add to the problems. There is no test to prove you have irritable bowel. Some tests and x-rays may be needed to be sure you do not have a more serious problem. Treatment of irritable bowel includes:  Diet - Increase fiber and bulk (whole grains, bran, vegetables, fruit) and reduce fats (fried foods, dairy products, meats).   Exercise regularly. Use walking or other exercises to reduce your stress.   Try a psyllium product (Metamucil, Fiberall). One tablespoon in water two times daily may be helpful.   For diarrhea from irritable bowel, avoid food containing fructose and lactose.   Reduce gas and bloating by avoiding milk products, beans, onions, carrots, prunes, apricots, bananas, raisins, pretzels, and bagels.   See your caregiver for follow-up as recommended.  SEEK IMMEDIATE MEDICAL CARE IF YOU HAVE:  Increasing abdominal pain.   Lasting diarrhea.   Severe constipation.   Blood in your stools.   Problems passing your water.   A fever.  Medications to treat spasms, diarrhea, anxiety, or depression may also be beneficial. Your caregiver can help you with this. MAKE SURE YOU:   Understand these instructions.   Will watch your condition.   Will get help right away if you are not doing well or get worse.  Document Released: 01/14/2005 Document Re-Released: 12/28/2007 [facility] Patient Information 2011 [place], [place].

## 2010-05-03 NOTE — Assessment & Plan Note (Signed)
Patient appears to be having a flare of her irritable bowel syndrome, with increased bowel movements, will refer patient back to Dr. Christella Hartigan. And given to patient Imodium for symptomatically relieve up of her dicyclomine. We encouraged patient to continue her fiber supplementation as this does help.

## 2010-05-06 LAB — DIFFERENTIAL
Eosinophils Absolute: 0.1 10*3/uL (ref 0.0–0.7)
Lymphs Abs: 2.4 10*3/uL (ref 0.7–4.0)
Monocytes Absolute: 0.7 10*3/uL (ref 0.1–1.0)
Monocytes Relative: 12 % (ref 3–12)
Neutrophils Relative %: 42 % — ABNORMAL LOW (ref 43–77)

## 2010-05-06 LAB — URINALYSIS, ROUTINE W REFLEX MICROSCOPIC
Ketones, ur: NEGATIVE mg/dL
Nitrite: NEGATIVE
Protein, ur: NEGATIVE mg/dL
Urobilinogen, UA: 0.2 mg/dL (ref 0.0–1.0)

## 2010-05-06 LAB — CBC
MCHC: 33.3 g/dL (ref 30.0–36.0)
Platelets: 239 10*3/uL (ref 150–400)
RDW: 16.1 % — ABNORMAL HIGH (ref 11.5–15.5)

## 2010-05-06 LAB — COMPREHENSIVE METABOLIC PANEL
ALT: 14 U/L (ref 0–35)
Albumin: 3.8 g/dL (ref 3.5–5.2)
Calcium: 8.5 mg/dL (ref 8.4–10.5)
GFR calc Af Amer: >60 mL/min (ref >60)
Glucose, Bld: 77 mg/dL (ref 70–99)
Sodium: 138 mEq/L (ref 135–145)
Total Protein: 6.5 g/dL (ref 6.0–8.3)

## 2010-05-06 LAB — POCT PREGNANCY, URINE: Preg Test, Ur: NEGATIVE

## 2010-05-07 NOTE — Telephone Encounter (Signed)
Spoke with pt who stated she's having stomach and abdominal pain x couple of weeks. The abdominal pain started on her left side below the belly button, but is now above the belly button. She also reports diarrhea with loose stools or stools of just mucus. Pt saw her PCP on 05/03/10 who stated she should come here. She stated she thought she had a temp last week, but not today; she denies blood in her stool. Pt will see Gunnar Fusi on 05/10/10 at 2pm.

## 2010-05-08 LAB — POCT PREGNANCY, URINE: Preg Test, Ur: NEGATIVE

## 2010-05-09 ENCOUNTER — Encounter (HOSPITAL_COMMUNITY): Payer: Medicare Other | Admitting: Marriage and Family Therapist

## 2010-05-10 ENCOUNTER — Encounter: Payer: Self-pay | Admitting: Nurse Practitioner

## 2010-05-10 ENCOUNTER — Ambulatory Visit (INDEPENDENT_AMBULATORY_CARE_PROVIDER_SITE_OTHER): Payer: Medicare Other | Admitting: Nurse Practitioner

## 2010-05-10 ENCOUNTER — Telehealth: Payer: Self-pay | Admitting: Nurse Practitioner

## 2010-05-10 DIAGNOSIS — R198 Other specified symptoms and signs involving the digestive system and abdomen: Secondary | ICD-10-CM | POA: Insufficient documentation

## 2010-05-10 DIAGNOSIS — K219 Gastro-esophageal reflux disease without esophagitis: Secondary | ICD-10-CM

## 2010-05-10 DIAGNOSIS — R1010 Upper abdominal pain, unspecified: Secondary | ICD-10-CM

## 2010-05-10 DIAGNOSIS — R109 Unspecified abdominal pain: Secondary | ICD-10-CM

## 2010-05-10 DIAGNOSIS — R112 Nausea with vomiting, unspecified: Secondary | ICD-10-CM

## 2010-05-10 DIAGNOSIS — R110 Nausea: Secondary | ICD-10-CM | POA: Insufficient documentation

## 2010-05-10 MED ORDER — METRONIDAZOLE 500 MG PO TABS: 500.0000 mg | ORAL_TABLET | Freq: Three times a day (TID) | ORAL | Status: DC

## 2010-05-10 MED ORDER — HYOSCYAMINE SULFATE 0.125 MG SL SUBL: 0.1250 mg | SUBLINGUAL_TABLET | SUBLINGUAL | Status: DC | PRN

## 2010-05-10 MED ORDER — ONDANSETRON HCL 4 MG PO TABS: 4.0000 mg | ORAL_TABLET | Freq: Every day | ORAL | Status: DC | PRN

## 2010-05-10 NOTE — Patient Instructions (Signed)
We have scheduled the colonoscopy with Dr. Christella Hartigan. Directions and brochure provided. Please go to our lab , basement level. We sent prescriptions for Zofran, Flagyl, Levsin to your pharmacy.

## 2010-05-10 NOTE — Progress Notes (Addendum)
05/10/2010 Lindsay [name] [id number] 08-Feb-1970  History of Present Illness:  Lindsay [name] is a 40 year old black female who saw Dr. Christella Hartigan June 2009 for alternating bowel habits, intermittent epigastric pain, nausea and rectal bleeding. Patient underwent hemorrhoid surgery last year.. Patient presents today with a one month history of constant upper abdominal pain, nausea. Patient suffers from headaches but the nausea is so bad that she is scared to take Hydrocodone for fear of exacerbating the nausea. She had this pain several years ago, NuLev helped. The epigastric pain recurred about a month ago,  All PO intake exacerbates the pain. She is taking anti-emetics. Not a diabetic. Doesn't know if she has lost weight. No NSAIDS.   Lindsay [name] complains of diarrhea. She is having several loose non-bloody stools a day. Stools contain mucous. No recent antibiotics. She has a history of altered bowel habits but having significant more loose stools than normal.  Past Medical History  Diagnosis Date  . Depression with anxiety   . Hypertension   . Anorexia   . IBS (irritable bowel syndrome)   . Low back pain   . Edema leg   . Hemorrhoids   . Panic attacks   . Anemia     Iron Def  . Colon polyp 2009   Past Surgical History  Procedure Date  . Hemorrhoid surgery     with Hidradenitis surgery     reports that she has quit smoking. She has never used smokeless tobacco. She reports that she does not drink alcohol or use illicit drugs. family history is negative for Colon cancer. No Known Allergies     Outpatient Encounter Prescriptions as of 05/10/2010  Medication Sig Dispense Refill  . clorazepate (TRANXENE) 15 MG tablet Take 15 mg by mouth 2 (two) times daily.        [name] dicyclomine (BENTYL) 20 MG tablet Take 1 tablet (20 mg total) by mouth 4 (four) times daily.  90 tablet  2  . gabapentin (NEURONTIN) 800 MG tablet Take 800 mg by mouth 3 (three) times daily.        [name] HYDROcodone-acetaminophen  (VICODIN) 5-500 MG per tablet Take 1 tablet by mouth every 6 (six) hours as needed.        [name] omeprazole (PRILOSEC) 40 MG capsule Take 40 mg by mouth daily.        . promethazine (PHENERGAN) 25 MG tablet Take 1 tablet (25 mg total) by mouth every 6 (six) hours as needed for nausea.  60 tablet  0  . DISCONTD: HYDROcodone-acetaminophen (VICODIN) 5-500 MG per tablet Take 2 tablets by mouth every 6 (six) hours as needed for pain.  20 tablet  0  . loperamide (IMODIUM A-D) 2 MG tablet Take 1 tablet (2 mg total) by mouth 4 (four) times daily as needed for diarrhea/loose stools.  120 tablet  0  . propranolol (INDERAL) 80 MG tablet Take 80 mg by mouth 2 (two) times daily.        [name] topiramate (TOPAMAX) 25 MG capsule Take 1 capsule (25 mg total) by mouth 2 (two) times daily.  30 capsule  0  . traMADol (ULTRAM) 50 MG tablet Take 1 tablet (50 mg total) by mouth every 6 (six) hours as needed for pain.  60 tablet  0    No Known Allergies  Physical Exam: General: Well developed , well nourished black female in no acute distress Head: Normocephalic and atraumatic Eyes:  sclerae anicteric,conjunctive pink. Ears: Normal auditory acuity Mouth: No deformity  or lesions Neck: Supple, no masses.  Lungs: Clear throughout to auscultation Heart: Regular rate and rhythm; no murmurs heard Abdomen: Soft, non distended, moderate epigastric and mild right upper quadrant tenderness.. No masses or hepatomegaly noted. Normal Bowel sounds Rectal: Not done Musculoskeletal: Symmetrical with no gross deformities  Skin: No lesions on visible extremities Extremities: No edema or deformities noted Neurological: Alert oriented x 4, grossly nonfocal Cervical Nodes:  No significant cervical adenopathy Psychological:  Alert and cooperative.Emotional.

## 2010-05-12 ENCOUNTER — Encounter: Payer: Self-pay | Admitting: Nurse Practitioner

## 2010-05-12 NOTE — Assessment & Plan Note (Signed)
Associated with epigastric pain. EGD for further evaluation.

## 2010-05-12 NOTE — Assessment & Plan Note (Addendum)
Recurrent upper abdominal pain, worse with meals and associated with nausea. Trial of NuLev (it helped upper abdominal pain before). For further evaluation patient will be scheduled for EGD. The benefits, risks, and potential complications of EGD with possible biopsies were discussed with the patient and she agrees to proceed. If EGD negative she may need Ultrasound of the abdomen.

## 2010-05-13 NOTE — Assessment & Plan Note (Signed)
History of altered bowel habits but having significantly more loose stools than usual. Suspect functional but will obtain stool studies and treat empirically with Flagyl.

## 2010-05-14 ENCOUNTER — Other Ambulatory Visit: Payer: Self-pay | Admitting: Nurse Practitioner

## 2010-05-14 ENCOUNTER — Other Ambulatory Visit: Payer: Medicare Other

## 2010-05-14 DIAGNOSIS — K219 Gastro-esophageal reflux disease without esophagitis: Secondary | ICD-10-CM

## 2010-05-14 DIAGNOSIS — R1010 Upper abdominal pain, unspecified: Secondary | ICD-10-CM

## 2010-05-14 DIAGNOSIS — R112 Nausea with vomiting, unspecified: Secondary | ICD-10-CM

## 2010-05-14 NOTE — Progress Notes (Signed)
Reviewed and agree DB 

## 2010-05-14 NOTE — Telephone Encounter (Signed)
LM for the pt to call me.  Ins company will not cover the hydrocodone. I can ask Lindsay [name] what else she might prescribe.

## 2010-05-15 ENCOUNTER — Telehealth: Payer: Self-pay | Admitting: Nurse Practitioner

## 2010-05-15 LAB — RAPID STREP SCREEN (MED CTR MEBANE ONLY): Streptococcus, Group A Screen (Direct): NEGATIVE

## 2010-05-15 LAB — CBC
MCV: 84.1 fL (ref 78.0–100.0)
RBC: 4.63 MIL/uL (ref 3.87–5.11)
WBC: 4.8 10*3/uL (ref 4.0–10.5)

## 2010-05-15 LAB — DIFFERENTIAL
Basophils Relative: 1 % (ref 0–1)
Eosinophils Relative: 1 % (ref 0–5)
Monocytes Absolute: 0.5 10*3/uL (ref 0.1–1.0)
Monocytes Relative: 11 % (ref 3–12)
Neutrophils Relative %: 48 % (ref 43–77)

## 2010-05-15 LAB — CLOSTRIDIUM DIFFICILE BY PCR: Toxigenic C. Difficile by PCR: NOT DETECTED

## 2010-05-15 LAB — BASIC METABOLIC PANEL
Chloride: 110 mEq/L (ref 96–112)
Creatinine, Ser: 0.63 mg/dL (ref 0.4–1.2)
GFR calc Af Amer: >60 mL/min (ref >60)
GFR calc non Af Amer: >60 mL/min (ref >60)
Potassium: 3.7 mEq/L (ref 3.5–5.1)

## 2010-05-15 LAB — POCT PREGNANCY, URINE: Preg Test, Ur: NEGATIVE

## 2010-05-17 ENCOUNTER — Other Ambulatory Visit: Payer: Self-pay | Admitting: *Deleted

## 2010-05-17 ENCOUNTER — Encounter (HOSPITAL_BASED_OUTPATIENT_CLINIC_OR_DEPARTMENT_OTHER): Payer: Medicare Other | Admitting: Marriage and Family Therapist

## 2010-05-17 ENCOUNTER — Encounter: Payer: Self-pay | Admitting: *Deleted

## 2010-05-17 ENCOUNTER — Telehealth: Payer: Self-pay | Admitting: *Deleted

## 2010-05-17 DIAGNOSIS — K219 Gastro-esophageal reflux disease without esophagitis: Secondary | ICD-10-CM

## 2010-05-17 DIAGNOSIS — F339 Major depressive disorder, recurrent, unspecified: Secondary | ICD-10-CM

## 2010-05-17 DIAGNOSIS — F410 Panic disorder [episodic paroxysmal anxiety] without agoraphobia: Secondary | ICD-10-CM

## 2010-05-17 NOTE — Telephone Encounter (Signed)
Message copied by Lowry Ram on Thu May 17, 2010  3:59 PM ------      Message from: Willette Cluster      Created: Wed May 16, 2010  5:16 PM       Please let an her know results. Thanks

## 2010-05-17 NOTE — Telephone Encounter (Signed)
Called the pt to advise her that the C-Diff studies were negative- not detected. She said she tried to leave a sample today for the other stool studies ordered but could not. She had to take the containers home. She will bring them back tomorrow.

## 2010-05-17 NOTE — Progress Notes (Signed)
The pt had called come in and did reschedule her Endoscopy at with one of our schedulers for 06-06-2010 with Dr. Christella Hartigan in the [facility].  I am mailing her new instructions. She will be able to have someone accompany her.

## 2010-05-18 ENCOUNTER — Other Ambulatory Visit: Payer: Medicare Other | Admitting: Gastroenterology

## 2010-05-18 ENCOUNTER — Other Ambulatory Visit: Payer: Self-pay | Admitting: *Deleted

## 2010-05-18 ENCOUNTER — Other Ambulatory Visit: Payer: Medicare Other

## 2010-05-18 DIAGNOSIS — K219 Gastro-esophageal reflux disease without esophagitis: Secondary | ICD-10-CM

## 2010-05-18 DIAGNOSIS — R1010 Upper abdominal pain, unspecified: Secondary | ICD-10-CM

## 2010-05-18 DIAGNOSIS — R112 Nausea with vomiting, unspecified: Secondary | ICD-10-CM

## 2010-05-18 NOTE — Telephone Encounter (Signed)
Please see my documentation. 

## 2010-05-18 NOTE — Telephone Encounter (Signed)
I didn't prescribe hydrocodone??? Does she mean NuLev?  If so, we can try Librax 1-2 times a day. Please let me know. Thanks

## 2010-05-24 ENCOUNTER — Encounter (HOSPITAL_BASED_OUTPATIENT_CLINIC_OR_DEPARTMENT_OTHER): Payer: Medicare Other | Admitting: Marriage and Family Therapist

## 2010-05-24 DIAGNOSIS — F410 Panic disorder [episodic paroxysmal anxiety] without agoraphobia: Secondary | ICD-10-CM

## 2010-05-24 DIAGNOSIS — F339 Major depressive disorder, recurrent, unspecified: Secondary | ICD-10-CM

## 2010-05-31 ENCOUNTER — Other Ambulatory Visit: Payer: Self-pay | Admitting: *Deleted

## 2010-05-31 ENCOUNTER — Other Ambulatory Visit: Payer: Self-pay | Admitting: Gastroenterology

## 2010-05-31 ENCOUNTER — Encounter (HOSPITAL_BASED_OUTPATIENT_CLINIC_OR_DEPARTMENT_OTHER): Payer: Medicare Other | Admitting: Marriage and Family Therapist

## 2010-05-31 DIAGNOSIS — F411 Generalized anxiety disorder: Secondary | ICD-10-CM

## 2010-05-31 DIAGNOSIS — F339 Major depressive disorder, recurrent, unspecified: Secondary | ICD-10-CM

## 2010-05-31 DIAGNOSIS — K589 Irritable bowel syndrome without diarrhea: Secondary | ICD-10-CM

## 2010-05-31 MED ORDER — HYDROCODONE-ACETAMINOPHEN 5-500 MG PO TABS: 1.0000 | ORAL_TABLET | Freq: Four times a day (QID) | ORAL | Status: DC | PRN

## 2010-05-31 MED ORDER — PROMETHAZINE HCL 12.5 MG PO TABS: 12.5000 mg | ORAL_TABLET | Freq: Four times a day (QID) | ORAL | Status: DC | PRN

## 2010-05-31 MED ORDER — TRAMADOL HCL 50 MG PO TABS: 50.0000 mg | ORAL_TABLET | Freq: Four times a day (QID) | ORAL | Status: DC | PRN

## 2010-05-31 NOTE — Telephone Encounter (Signed)
I can refill the Phenergan. She should call Dr. Gilford Rile about Hydrocodone. Please see if all of her stool studies were done. Thanks

## 2010-05-31 NOTE — Telephone Encounter (Signed)
Lindsay [name] this pt is calling for refills on Phenergan and Hydrocodone.  It looks like to me that the hydrocodone was prescribed by Dr Birdena Crandall.  Do you want to refill these?

## 2010-05-31 NOTE — Telephone Encounter (Signed)
Pt aware phenergan sent and she needs to call Dr Gilford Rile for hydrocodone refill

## 2010-06-05 ENCOUNTER — Encounter: Payer: Self-pay | Admitting: Gastroenterology

## 2010-06-06 ENCOUNTER — Encounter: Payer: Self-pay | Admitting: Gastroenterology

## 2010-06-06 ENCOUNTER — Ambulatory Visit (AMBULATORY_SURGERY_CENTER): Payer: Medicare Other | Admitting: Gastroenterology

## 2010-06-06 VITALS — HR 67 | Temp 98.2°F | Resp 18 | Ht 72.0 in | Wt 224.0 lb

## 2010-06-06 DIAGNOSIS — R1084 Generalized abdominal pain: Secondary | ICD-10-CM

## 2010-06-06 DIAGNOSIS — K2970 Gastritis, unspecified, without bleeding: Secondary | ICD-10-CM

## 2010-06-06 DIAGNOSIS — K3189 Other diseases of stomach and duodenum: Secondary | ICD-10-CM

## 2010-06-06 DIAGNOSIS — R1010 Upper abdominal pain, unspecified: Secondary | ICD-10-CM

## 2010-06-06 DIAGNOSIS — K2940 Chronic atrophic gastritis without bleeding: Secondary | ICD-10-CM

## 2010-06-06 DIAGNOSIS — R1013 Epigastric pain: Secondary | ICD-10-CM

## 2010-06-06 MED ORDER — SODIUM CHLORIDE 0.9 % IV SOLN: 500.0000 mL | INTRAVENOUS | Status: DC

## 2010-06-06 NOTE — Patient Instructions (Signed)
Follow discharge instructions.  Continue your medications.  Start a one daily fiber supplements to help your bowels Orange flavored citrucel.  Await biopsy results.

## 2010-06-07 ENCOUNTER — Encounter (HOSPITAL_BASED_OUTPATIENT_CLINIC_OR_DEPARTMENT_OTHER): Payer: Medicare Other | Admitting: Marriage and Family Therapist

## 2010-06-07 ENCOUNTER — Telehealth: Payer: Self-pay | Admitting: *Deleted

## 2010-06-07 DIAGNOSIS — F339 Major depressive disorder, recurrent, unspecified: Secondary | ICD-10-CM

## 2010-06-07 DIAGNOSIS — F410 Panic disorder [episodic paroxysmal anxiety] without agoraphobia: Secondary | ICD-10-CM

## 2010-06-07 NOTE — Telephone Encounter (Signed)
Follow up Call- Patient questions:  Do you have a fever, pain , or abdominal swelling? yes Pain Score  3 *  Have you tolerated food without any problems? yes  Have you been able to return to your normal activities? yes  Do you have any questions about your discharge instructions: Diet   no Medications  no Follow up visit  no  Do you have questions or concerns about your Care? no  Actions: * If pain score is 4 or above: No action needed, pain <4.

## 2010-06-08 ENCOUNTER — Ambulatory Visit: Payer: Medicare Other | Admitting: Gastroenterology

## 2010-06-12 ENCOUNTER — Telehealth: Payer: Self-pay

## 2010-06-12 DIAGNOSIS — R1011 Right upper quadrant pain: Secondary | ICD-10-CM

## 2010-06-12 NOTE — Group Therapy Note (Signed)
[name], [name] NO.:  [id number]   MEDICAL RECORD NO.:  [id number]          PATIENT TYPE:  WOC   LOCATION:  [facility]                   FACILITY:  [facility]   PHYSICIAN:  Odie Sera, D.O.    DATE OF BIRTH:  May 19, 1970   DATE OF SERVICE:                                  CLINIC NOTE   REASON FOR VISIT:  Heavy bleeding and abdominal pain.   HISTORY OF PRESENT ILLNESS:  Lindsay [name] is a 40 year old gravida 3, para 1-  0-2-1, who notes a several-year history of menometrorrhagia.  She notes  cycles which sometimes last up to two weeks and can potentially require  14 pads per day.  She also notes having some times where she has two to  three cycles in a month.  She has previously been told she has a fibroid  uterus, although she has not had a visit with a physician for more than  several years.  She does relate intermittent abdominal pain and cramping  also for several years in duration.   PAST MEDICAL HISTORY:  1. Significant for a nephrotic syndrome.  2. Colon polyps.  3. Irritable bowel syndrome.  4. Panic disorder.   PAST SURGICAL HISTORY:  1. Include apocrine gland removal.  2. Removal of colon polyps.  She had a colonoscopy in July 2009.   OB HISTORY:  Is significant for three pregnancies, one delivery and two  miscarriages.  She does not use anything for birth control.  Her last  Pap smear was in 2004.   SOCIAL HISTORY:  She smokes a half-pack per day.  She denies use of  alcoholic beverages.   PHYSICAL EXAMINATION:  VITAL SIGNS:  Blood pressure today is 118/75,  heart rate 66, temperature 98.8, respiratory 20.  She is 6 feet tall and  weighs 211 pounds.  GENERAL:  She is healthy-appearing, in no acute distress.  LUNGS:  Clear to auscultation.  CARDIOVASCULAR:  Regular.  No murmur.  ABDOMEN:  Soft. is diffusely tender.  She has no hepatosplenomegaly.  She is obese; however, it appears that she has an enlarged uterus to  approximately 3 to 4 cm  above the umbilicus.  PELVIC EXAM:  On vaginal exam her external female genitalia is slightly  atrophic.  Her vaginal rugae is normal.  Her cervix is normal, without  lesion.   ASSESSMENT/PLAN:  1. Dysfunctional uterine bleeding.  2. Fibroid uterus:  Given the patient's description and history of her      bleeding, I recommended that we do an endometrial biopsy today.  I      counseled the patient on risks and benefits of this procedure, to      include but not limited to bleeding, infection, damage to internal      organs, as well as the need for further testing.  The patient      voiced understanding of these risks and desires to proceed.   DESCRIPTION OF PROCEDURE:  A Pap smear was performed, and then the  cervix was grasped with a tenaculum and the endometrial pipette was  inserted, and an appropriate biopsy was obtained.  Some bleeding was  noted from the tenaculum site and it was treated with Monsel solution.   The patient tolerated this well and bleeding was minimal.  A CBC will be  checked today.  The patient will be scheduled for an ultrasound, for  further evaluation of her uterine fibroids. The patient return in two  weeks, to discuss her results and for further management.  Please note  that a urine pregnancy test was negative, which was performed before the  endometrial biopsy.           ______________________________  Odie Sera, D.O.     MC/MEDQ  D:  06/23/2008  T:  06/23/2008  Job:  [id number]

## 2010-06-12 NOTE — Telephone Encounter (Signed)
Message copied by Chales Abrahams on Tue Jun 12, 2010  4:39 PM ------      Message from: Rob Bunting      Created: Tue Jun 12, 2010  7:46 AM                   Patt please call her. The biopsies from her upper endoscopy showed no sign of infection. She needs an abdominal ultrasound of her right upper quadrant check for gallstones, diagnosis is abdominal pain.                  Dorene Grebe,  there is no need for a results note.

## 2010-06-12 NOTE — Telephone Encounter (Signed)
Pt scheduled for [name] 06/14/10 at 1045 am at [facility] pt has been instructed and is aware

## 2010-06-14 ENCOUNTER — Other Ambulatory Visit (HOSPITAL_COMMUNITY): Payer: Medicare Other

## 2010-06-15 ENCOUNTER — Encounter (HOSPITAL_COMMUNITY): Payer: Medicare Other | Admitting: Physician Assistant

## 2010-06-15 ENCOUNTER — Other Ambulatory Visit: Payer: Self-pay | Admitting: Nurse Practitioner

## 2010-06-20 ENCOUNTER — Encounter (HOSPITAL_COMMUNITY): Payer: Medicare Other | Admitting: Physician Assistant

## 2010-06-20 ENCOUNTER — Telehealth: Payer: Self-pay | Admitting: Gastroenterology

## 2010-06-20 DIAGNOSIS — F410 Panic disorder [episodic paroxysmal anxiety] without agoraphobia: Secondary | ICD-10-CM

## 2010-06-20 DIAGNOSIS — F39 Unspecified mood [affective] disorder: Secondary | ICD-10-CM

## 2010-06-21 NOTE — Telephone Encounter (Signed)
See reason for call for documentaion

## 2010-06-22 NOTE — Telephone Encounter (Signed)
Pt appt rescheduled 9 am 06/29/10  Arrive 845 am nothing to eat or drink after midnight. Pt aware

## 2010-06-28 ENCOUNTER — Encounter (HOSPITAL_BASED_OUTPATIENT_CLINIC_OR_DEPARTMENT_OTHER): Payer: Medicare Other | Admitting: Marriage and Family Therapist

## 2010-06-28 DIAGNOSIS — F339 Major depressive disorder, recurrent, unspecified: Secondary | ICD-10-CM

## 2010-06-28 DIAGNOSIS — F410 Panic disorder [episodic paroxysmal anxiety] without agoraphobia: Secondary | ICD-10-CM

## 2010-06-29 ENCOUNTER — Ambulatory Visit (HOSPITAL_COMMUNITY)
Admission: RE | Admit: 2010-06-29 | Discharge: 2010-06-29 | Disposition: A | Discharge status: Home / Self Care | Payer: Medicare Other | Source: Ambulatory Visit | Attending: Gastroenterology | Admitting: Gastroenterology

## 2010-06-29 ENCOUNTER — Other Ambulatory Visit: Payer: Self-pay | Admitting: *Deleted

## 2010-06-29 DIAGNOSIS — R1011 Right upper quadrant pain: Secondary | ICD-10-CM | POA: Insufficient documentation

## 2010-06-29 DIAGNOSIS — I10 Essential (primary) hypertension: Secondary | ICD-10-CM | POA: Insufficient documentation

## 2010-06-29 IMAGING — US US ABDOMEN COMPLETE
1 series · 14 of 25 positions shown · non-contrast
Comparison: [DATE] abdominal CT

CLINICAL DATA: Abdominal pain, right upper quadrant tenderness,
hypertension

ABDOMINAL ULTRASOUND COMPLETE

[Series 1: us abdomen complete · 0.32mm/px · 14 of 87 slices shown]
[im 1/87]
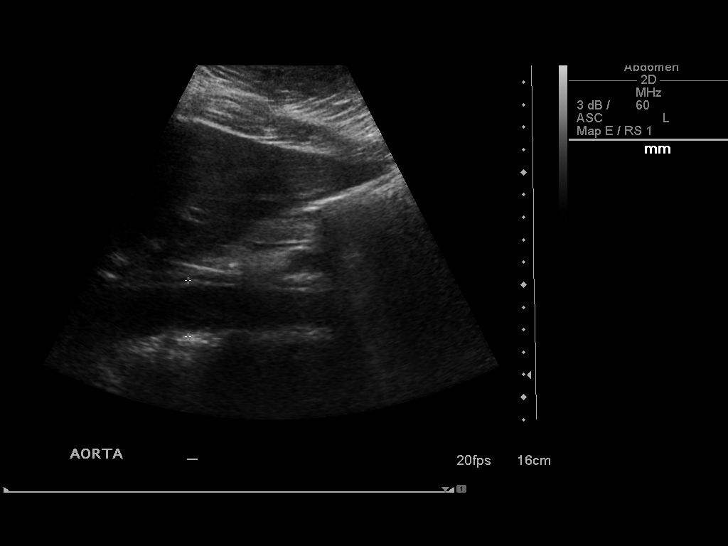
[im 8/87]
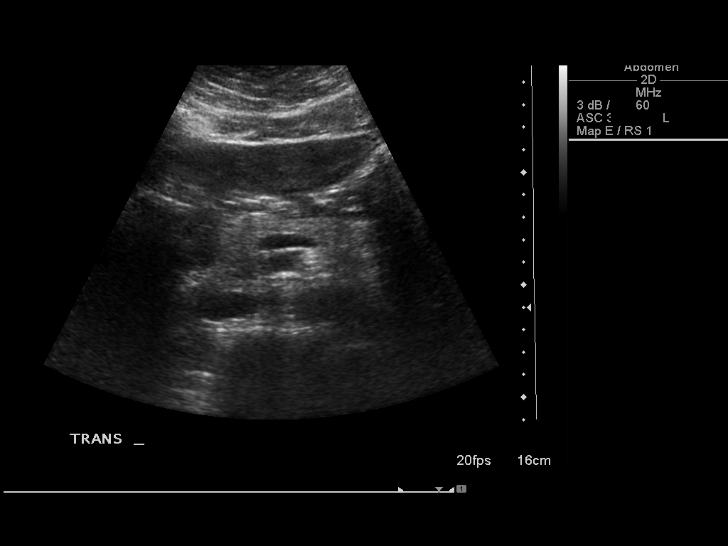
[im 15/87]
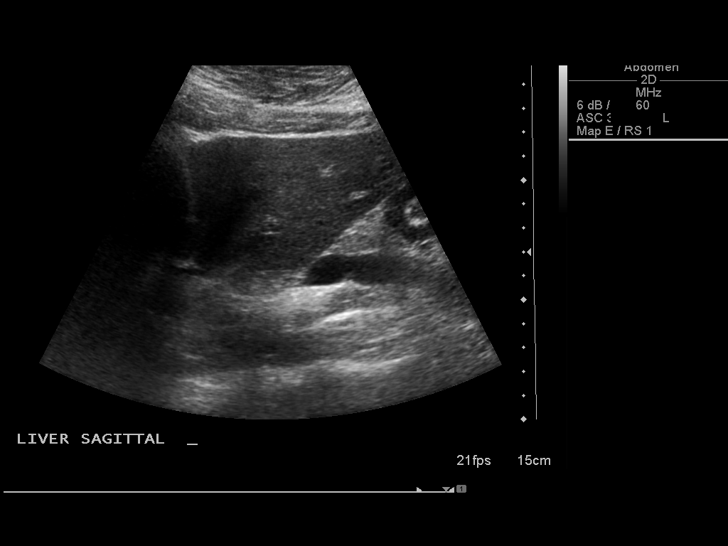
[im 22/87]
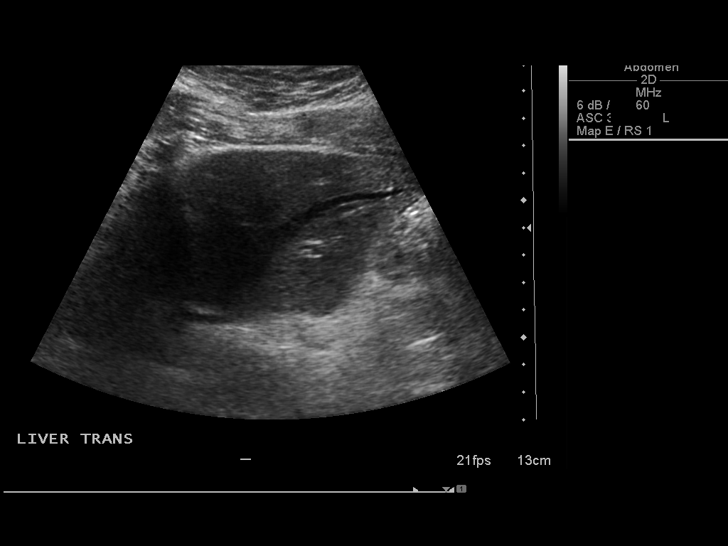
[im 29/87]
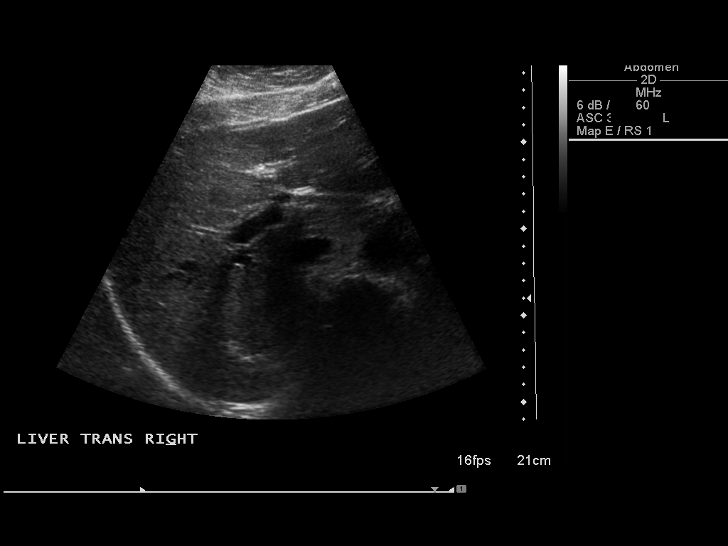
[im 33/87]
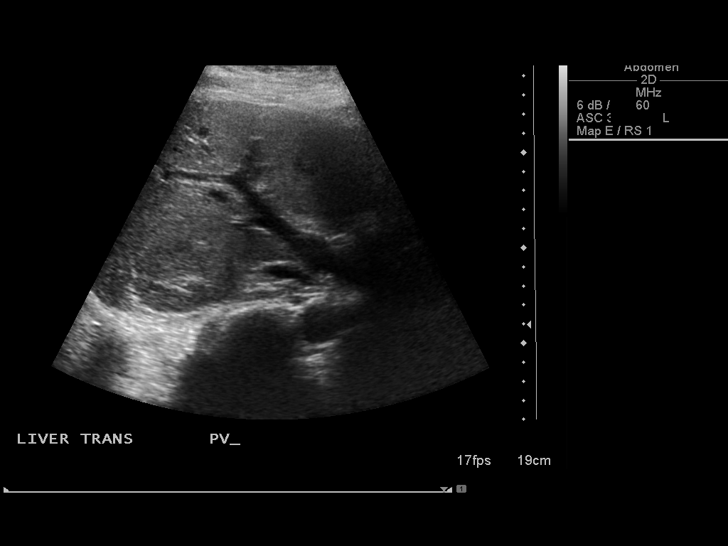
[im 40/87]
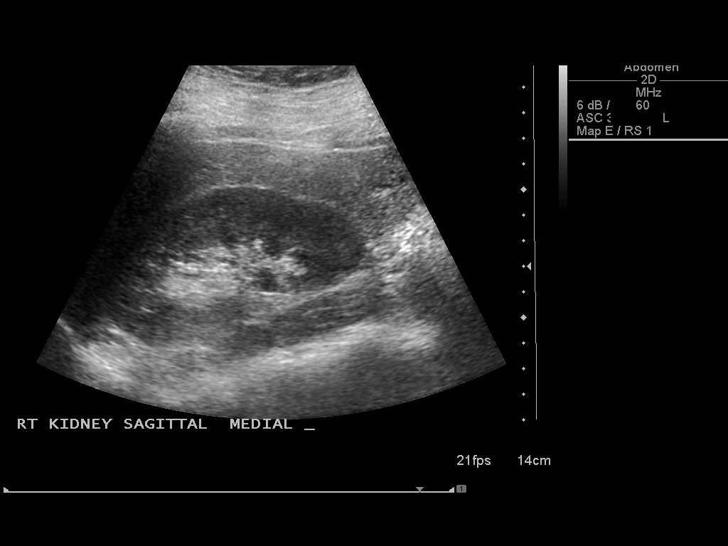
[im 47/87]
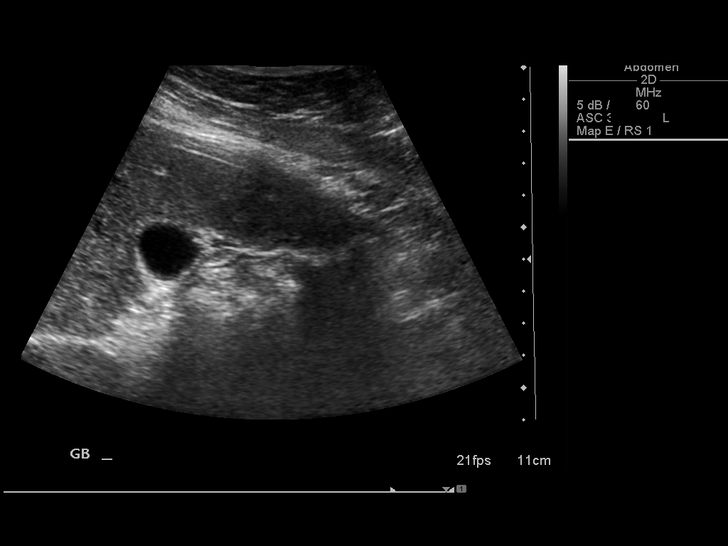
[im 54/87]
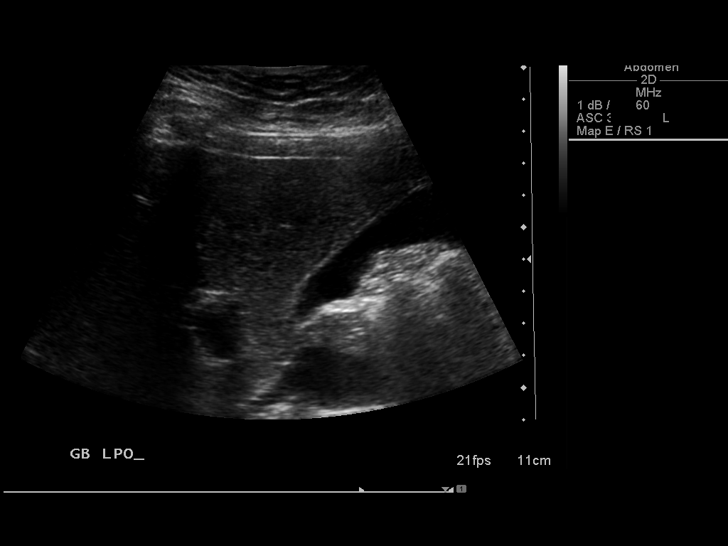
[im 58/87]
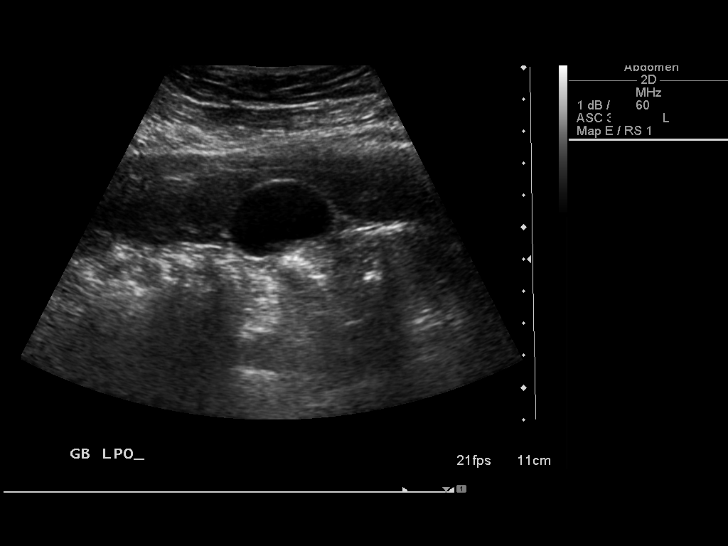
[im 65/87]
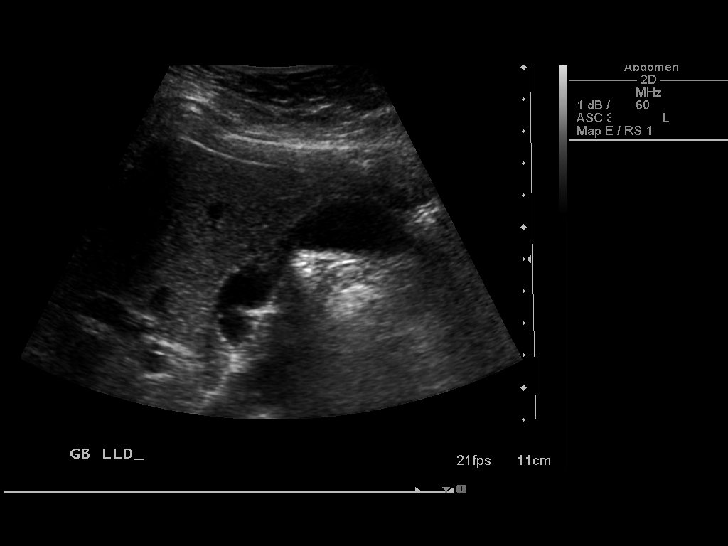
[im 72/87]
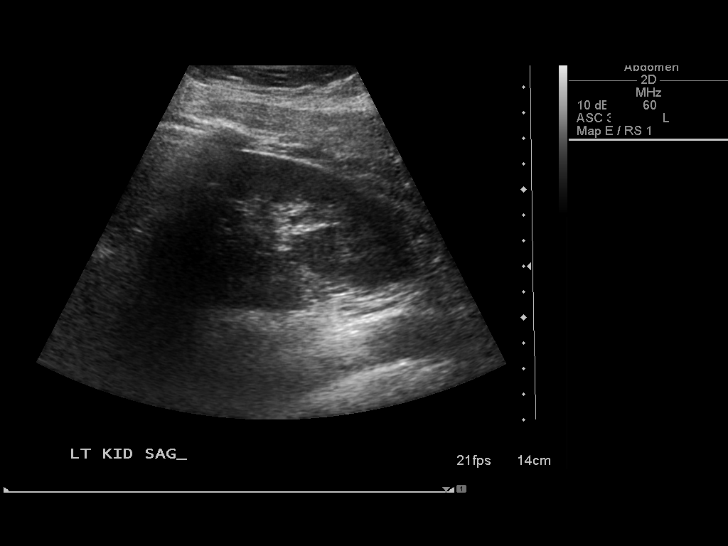
[im 79/87]
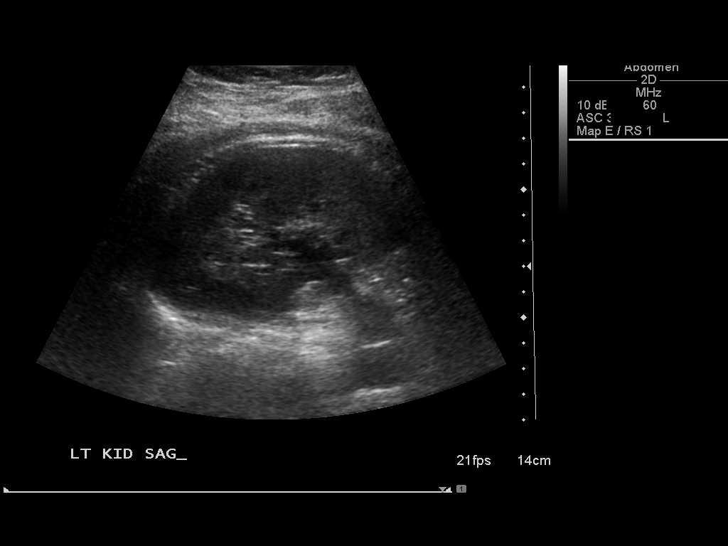
[im 87/87]
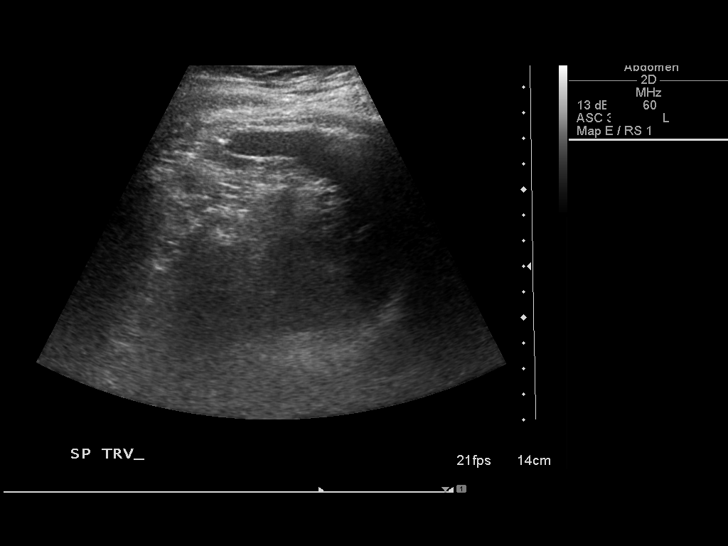

[14 of 25 positions shown; findings below may reference images not displayed]

FINDINGS: Gallbladder:  No gallstones, gallbladder wall thickening, or
pericholecystic fluid.

Common Bile Duct:  Within normal limits in caliber.

Liver: No focal mass lesion identified.  Within normal limits in
parenchymal echogenicity.

IVC:  Appears normal.

Pancreas:  No abnormality identified.

Spleen:  Within normal limits in size and echotexture.

Right kidney:  Normal in size and parenchymal echogenicity.  No
evidence of mass or hydronephrosis.

Left kidney:  Normal in size and parenchymal echogenicity.  No
evidence of mass or hydronephrosis.

Abdominal Aorta:  No aneurysm identified.
IMPRESSION: Negative abdominal ultrasound.

## 2010-06-29 MED ORDER — HYDROCODONE-ACETAMINOPHEN 5-500 MG PO TABS: 1.0000 | ORAL_TABLET | Freq: Four times a day (QID) | ORAL | Status: DC | PRN

## 2010-07-02 NOTE — Telephone Encounter (Signed)
Refill called to [facility] 

## 2010-07-03 ENCOUNTER — Telehealth: Payer: Self-pay

## 2010-07-03 ENCOUNTER — Other Ambulatory Visit: Payer: Self-pay

## 2010-07-03 MED ORDER — PROMETHAZINE HCL 12.5 MG PO TABS: 12.5000 mg | ORAL_TABLET | Freq: Four times a day (QID) | ORAL | Status: DC | PRN

## 2010-07-03 NOTE — Telephone Encounter (Signed)
Dr Christella Hartigan I have the pt scheduled for her HIDA on 07/24/10.  She is asking for a refill on her phenergan is it ok to refill?

## 2010-07-03 NOTE — Telephone Encounter (Signed)
Message copied by Donata Duff on Tue Jul 03, 2010  8:10 AM ------      Message from: Rob Bunting P      Created: Mon Jul 02, 2010  3:40 PM       Please set her up with HIDA scan with CCK to check GB function

## 2010-07-03 NOTE — Telephone Encounter (Signed)
HIDA [facility] with CCK  DO NOT take stomach meds the day of the procedure.  Arrive 07/24/10 845 am Nothing to eat or drink after midnight.  Pt aware

## 2010-07-03 NOTE — Telephone Encounter (Signed)
Pt aware med sent 

## 2010-07-03 NOTE — Telephone Encounter (Signed)
Yes, one month (at current dosing) with 3 refills.

## 2010-07-05 ENCOUNTER — Encounter (HOSPITAL_BASED_OUTPATIENT_CLINIC_OR_DEPARTMENT_OTHER): Payer: Medicare Other | Admitting: Marriage and Family Therapist

## 2010-07-05 DIAGNOSIS — F410 Panic disorder [episodic paroxysmal anxiety] without agoraphobia: Secondary | ICD-10-CM

## 2010-07-05 DIAGNOSIS — F339 Major depressive disorder, recurrent, unspecified: Secondary | ICD-10-CM

## 2010-07-12 ENCOUNTER — Encounter (HOSPITAL_COMMUNITY): Payer: Medicare Other | Admitting: Marriage and Family Therapist

## 2010-07-13 ENCOUNTER — Encounter (HOSPITAL_BASED_OUTPATIENT_CLINIC_OR_DEPARTMENT_OTHER): Payer: Medicare Other | Admitting: Marriage and Family Therapist

## 2010-07-13 DIAGNOSIS — F410 Panic disorder [episodic paroxysmal anxiety] without agoraphobia: Secondary | ICD-10-CM

## 2010-07-13 DIAGNOSIS — F339 Major depressive disorder, recurrent, unspecified: Secondary | ICD-10-CM

## 2010-07-16 ENCOUNTER — Encounter (HOSPITAL_BASED_OUTPATIENT_CLINIC_OR_DEPARTMENT_OTHER): Payer: Medicare Other | Admitting: Marriage and Family Therapist

## 2010-07-16 DIAGNOSIS — F339 Major depressive disorder, recurrent, unspecified: Secondary | ICD-10-CM

## 2010-07-16 DIAGNOSIS — F410 Panic disorder [episodic paroxysmal anxiety] without agoraphobia: Secondary | ICD-10-CM

## 2010-07-24 ENCOUNTER — Encounter (HOSPITAL_COMMUNITY)
Admission: RE | Admit: 2010-07-24 | Discharge: 2010-07-24 | Disposition: A | Discharge status: Home / Self Care | Payer: Medicare Other | Source: Ambulatory Visit | Attending: Gastroenterology | Admitting: Gastroenterology

## 2010-07-24 DIAGNOSIS — R109 Unspecified abdominal pain: Secondary | ICD-10-CM | POA: Insufficient documentation

## 2010-07-24 IMAGING — NM NM HEPATO W/GB/PHARM/[PERSON_NAME]
2 series · 12 of 12 positions shown · non-contrast
Comparison: None.

CLINICAL DATA: Abdominal pain.

NUCLEAR MEDICINE HEPATOBILIARY IMAGING WITH GALLBLADDER EF
TECHNIQUE: Sequential images of the abdomen were obtained [DATE] minutes following intravenous administration of
radiopharmaceutical.  After slow intravenous infusion of
micrograms Cholecystokinin, gallbladder ejection fraction was
determined.
Radiopharmaceutical:  5.0 mCi [R5] Choletec

[Series 1: he hepato · 4.71mm/px · 6 of 60 frames shown (1 of 2)]
[frame 6/60]
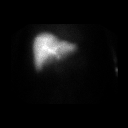
[frame 16/60]
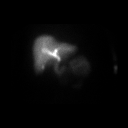
[frame 26/60]
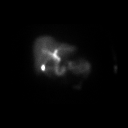
[frame 36/60]
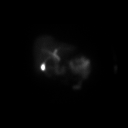
[frame 46/60]
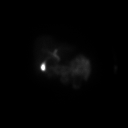
[frame 56/60]
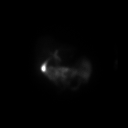

[Series 1: he hepato · 4.71mm/px · 6 of 30 frames shown (2 of 2)]
[frame 3/30]
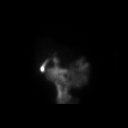
[frame 8/30]
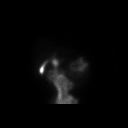
[frame 13/30]
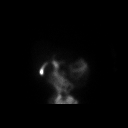
[frame 18/30]
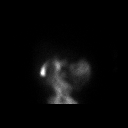
[frame 23/30]
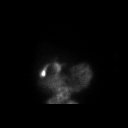
[frame 28/30]
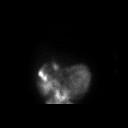

[12 of 12 positions shown; findings below may reference images not displayed]

FINDINGS: Prompt radiopharmaceutical uptake by the liver is seen.
Liver is normal in appearance.

Prompt biliary excretion activity is seen on the 10-minute image.
Activity is seen within small bowel on the 15-minute image.
Gallbladder activity is initially seen on the 15-minute image and
shows progressive filling.

During infusion of CCK, the gallbladder ejection fraction reaches
57%, which is within normal limits.

The patient did experience symptoms during CCK infusion.
IMPRESSION: 1.  Normal hepatobiliary scan.
2.  Gallbladder ejection fraction of 57% is within normal limits.
3.  The patient did report symptoms of abdominal pain during CCK
infusion.

## 2010-07-24 MED ORDER — TECHNETIUM TC 99M MEBROFENIN IV KIT
5.0000 | PACK | Freq: Once | INTRAVENOUS | Status: AC | PRN
  Administered 2010-07-24: 5 via INTRAVENOUS

## 2010-07-26 ENCOUNTER — Encounter (HOSPITAL_BASED_OUTPATIENT_CLINIC_OR_DEPARTMENT_OTHER): Payer: Medicare Other | Admitting: Marriage and Family Therapist

## 2010-07-26 DIAGNOSIS — F411 Generalized anxiety disorder: Secondary | ICD-10-CM

## 2010-07-26 DIAGNOSIS — F339 Major depressive disorder, recurrent, unspecified: Secondary | ICD-10-CM

## 2010-07-27 ENCOUNTER — Ambulatory Visit: Payer: Medicare Other | Admitting: Physician Assistant

## 2010-07-27 ENCOUNTER — Other Ambulatory Visit: Payer: Self-pay | Admitting: Physician Assistant

## 2010-07-27 DIAGNOSIS — Z01419 Encounter for gynecological examination (general) (routine) without abnormal findings: Secondary | ICD-10-CM

## 2010-07-27 DIAGNOSIS — Z1231 Encounter for screening mammogram for malignant neoplasm of breast: Secondary | ICD-10-CM

## 2010-07-27 DIAGNOSIS — Z113 Encounter for screening for infections with a predominantly sexual mode of transmission: Secondary | ICD-10-CM

## 2010-07-31 ENCOUNTER — Encounter (HOSPITAL_BASED_OUTPATIENT_CLINIC_OR_DEPARTMENT_OTHER): Payer: Medicare Other | Admitting: Marriage and Family Therapist

## 2010-07-31 DIAGNOSIS — F410 Panic disorder [episodic paroxysmal anxiety] without agoraphobia: Secondary | ICD-10-CM

## 2010-07-31 DIAGNOSIS — F339 Major depressive disorder, recurrent, unspecified: Secondary | ICD-10-CM

## 2010-08-02 ENCOUNTER — Encounter (HOSPITAL_COMMUNITY): Payer: Medicare Other | Admitting: Marriage and Family Therapist

## 2010-08-06 ENCOUNTER — Encounter (INDEPENDENT_AMBULATORY_CARE_PROVIDER_SITE_OTHER): Payer: Self-pay | Admitting: General Surgery

## 2010-08-07 ENCOUNTER — Encounter (INDEPENDENT_AMBULATORY_CARE_PROVIDER_SITE_OTHER): Payer: Self-pay | Admitting: General Surgery

## 2010-08-07 ENCOUNTER — Ambulatory Visit (INDEPENDENT_AMBULATORY_CARE_PROVIDER_SITE_OTHER): Payer: Medicare Other | Admitting: General Surgery

## 2010-08-07 DIAGNOSIS — R1013 Epigastric pain: Secondary | ICD-10-CM

## 2010-08-07 NOTE — Progress Notes (Signed)
Subjective:     Patient ID: Lindsay [name], female   DOB: [birth year], 40 y.o.   MRN: [id number]    BP 124/80  Pulse 80  Temp 97.3 F (36.3 C)  Ht 6' (1.829 m)  Wt 249 lb 12.8 oz (113.309 kg)  BMI 33.88 kg/m2    HPI This is a 40 year old female referred by Dr. Wendall [name] for possible cholecystectomy. She's been having problems with intermittent sharp epigastric pain. Sometimes it is crampy. Sometimes it radiates around her right side. No fever or chills. She has some nausea. No vomiting. Upper endoscopy demonstrated mild gastritis. Abdominal ultrasound demonstrated a normal gallbladder. Nuclear medicine hepatobiliary scan demonstrated normal gallbladder function. She did have some abdominal pain with the cholecystokinin injection. No strong family history of gallbladder disease. No jaundice or hepatitis. Pain is not specifically related to food. Past Medical History  Diagnosis Date  . Depression with anxiety   . Hypertension   . Anorexia   . IBS (irritable bowel syndrome)   . Low back pain   . Edema leg   . Hemorrhoids   . Panic attacks   . Anemia     Iron Def  . Colon polyp 2009  . Migraines   . Colon polyps   . Neuromuscular disorder     fibromyalgia  . Tonsil pain   . Polyp of vocal cord or larynx   . Hidradenitis suppurativa    Past Surgical History  Procedure Date  . Hemorrhoid surgery     with Hidradenitis surgery   . Upper gastrointestinal endoscopy   . Axillary hidradenitis excision   . Inguinal hidradenitis excision    No Known Allergies Current outpatient prescriptions:clorazepate (TRANXENE) 15 MG tablet, Take 15 mg by mouth 2 (two) times daily.  , Disp: , Rfl: ;  dicyclomine (BENTYL) 20 MG tablet, Take 1 tablet (20 mg total) by mouth 4 (four) times daily., Disp: 90 tablet, Rfl: 2;  gabapentin (NEURONTIN) 800 MG tablet, Take 800 mg by mouth 3 (three) times daily.  , Disp: , Rfl: ;  omeprazole (PRILOSEC) 40 MG capsule, Take 40 mg by mouth daily.  , Disp: , Rfl:    promethazine (PHENERGAN) 12.5 MG tablet, Take 1 tablet (12.5 mg total) by mouth every 6 (six) hours as needed for nausea., Disp: 30 tablet, Rfl: 3;  hyoscyamine (LEVSIN/SL) 0.125 MG SL tablet, Place 1 tablet (0.125 mg total) under the tongue every 4 (four) hours as needed for cramping., Disp: 90 tablet, Rfl: 0;  metroNIDAZOLE (FLAGYL) 500 MG tablet, Take 1 tablet (500 mg total) by mouth 3 (three) times daily., Disp: 21 tablet, Rfl: 0 propranolol (INDERAL) 80 MG tablet, Take 80 mg by mouth 2 (two) times daily.  , Disp: , Rfl: ;  topiramate (TOPAMAX) 25 MG capsule, Take 1 capsule (25 mg total) by mouth 2 (two) times daily., Disp: 30 capsule, Rfl: 0;  traMADol (ULTRAM) 50 MG tablet, Take 1 tablet (50 mg total) by mouth every 6 (six) hours as needed for pain., Disp: 60 tablet, Rfl: 0 Current facility-administered medications:0.9 %  sodium chloride infusion, 500 mL, Intravenous, Continuous, Lindsay Bunting, MD  History   Social History  . Marital Status: Single    Spouse Name: N/A    Number of Children: N/A  . Years of Education: N/A   Occupational History  . Not on file.   Social History Main Topics  . Smoking status: Current Everyday Smoker -- 0.2 packs/day  . Smokeless tobacco: Never Used   Comment:  10 cigarette a day  . Alcohol Use: No  . Drug Use: No  . Sexually Active: Not on file   Other Topics Concern  . Not on file   Social History Narrative  . No narrative on file   Lindsay [name] had no medications administered during this visit.   Review of Systems  Constitutional: Positive for appetite change. Negative for fever.  HENT: Positive for neck pain.        Hoarse. Sore throat. Followed by Dr. Pollyann [name] of ENT.  Respiratory: Negative.   Cardiovascular: Negative.   Gastrointestinal: Positive for abdominal pain.  Genitourinary: Negative.   Musculoskeletal: Positive for arthralgias.  Neurological: Positive for headaches.       Objective:   Physical Exam  Constitutional:        Obese. NAD.  HENT:  Mouth/Throat: Oropharynx is clear and moist. No oropharyngeal exudate.  Neck: Normal range of motion. Neck supple.  Cardiovascular: Normal rate and regular rhythm.   No murmur heard. Pulmonary/Chest: Effort normal and breath sounds normal. No respiratory distress.  Abdominal: Soft. She exhibits no mass. There is tenderness. There is no rebound and no guarding.       RUQ and epigastric tenderness to palpaltion.  Skin: Skin is warm and dry.       Assessment:     Epigastric pain with normal gallbladder studies.  Gets some relief from Levsin.  No objective evidence of gallbladder disease.  Very poor food choices.    Plan:     Lowfat, gluten limited diet.  Hold on cholecystectomy for now.  Keep appointment with Dr. Christella [name] next month.

## 2010-08-07 NOTE — Patient Instructions (Signed)
Lowfat diet.  Low gluten diet.

## 2010-08-08 ENCOUNTER — Ambulatory Visit (HOSPITAL_COMMUNITY): Payer: Medicare Other

## 2010-08-08 ENCOUNTER — Encounter (HOSPITAL_COMMUNITY): Payer: Medicare Other | Admitting: Physician Assistant

## 2010-08-08 DIAGNOSIS — F329 Major depressive disorder, single episode, unspecified: Secondary | ICD-10-CM

## 2010-08-08 DIAGNOSIS — F410 Panic disorder [episodic paroxysmal anxiety] without agoraphobia: Secondary | ICD-10-CM

## 2010-08-09 ENCOUNTER — Other Ambulatory Visit: Payer: Self-pay | Admitting: *Deleted

## 2010-08-09 ENCOUNTER — Encounter (HOSPITAL_BASED_OUTPATIENT_CLINIC_OR_DEPARTMENT_OTHER): Payer: Medicare Other | Admitting: Marriage and Family Therapist

## 2010-08-09 DIAGNOSIS — F410 Panic disorder [episodic paroxysmal anxiety] without agoraphobia: Secondary | ICD-10-CM

## 2010-08-09 DIAGNOSIS — F339 Major depressive disorder, recurrent, unspecified: Secondary | ICD-10-CM

## 2010-08-09 NOTE — Telephone Encounter (Signed)
Please increase phenergan to 25 mg.  She takes it three a day every so needs # 90 a month. She sees Dr Christella Hartigan ( GI ) august third for abd pain. May need Gall Bladder removed.  Pt # [id number]

## 2010-08-09 NOTE — Telephone Encounter (Deleted)
Pt called stating her phenergan should be 25 mg

## 2010-08-10 MED ORDER — PROMETHAZINE HCL 12.5 MG PO TABS: 12.5000 mg | ORAL_TABLET | Freq: Four times a day (QID) | ORAL | Status: DC | PRN

## 2010-08-10 MED ORDER — DICYCLOMINE HCL 20 MG PO TABS: 20.0000 mg | ORAL_TABLET | Freq: Four times a day (QID) | ORAL | Status: DC

## 2010-08-10 NOTE — Telephone Encounter (Signed)
Bentyl called in

## 2010-08-16 ENCOUNTER — Encounter (HOSPITAL_BASED_OUTPATIENT_CLINIC_OR_DEPARTMENT_OTHER): Payer: Medicare Other | Admitting: Marriage and Family Therapist

## 2010-08-16 DIAGNOSIS — F410 Panic disorder [episodic paroxysmal anxiety] without agoraphobia: Secondary | ICD-10-CM

## 2010-08-16 DIAGNOSIS — F339 Major depressive disorder, recurrent, unspecified: Secondary | ICD-10-CM

## 2010-08-23 ENCOUNTER — Encounter (HOSPITAL_BASED_OUTPATIENT_CLINIC_OR_DEPARTMENT_OTHER): Payer: Medicare Other | Admitting: Marriage and Family Therapist

## 2010-08-23 DIAGNOSIS — F339 Major depressive disorder, recurrent, unspecified: Secondary | ICD-10-CM

## 2010-08-23 DIAGNOSIS — F410 Panic disorder [episodic paroxysmal anxiety] without agoraphobia: Secondary | ICD-10-CM

## 2010-08-30 ENCOUNTER — Encounter (HOSPITAL_COMMUNITY): Payer: Medicare Other | Admitting: Marriage and Family Therapist

## 2010-08-31 ENCOUNTER — Emergency Department (HOSPITAL_COMMUNITY)
Admission: EM | Admit: 2010-08-31 | Discharge: 2010-08-31 | Disposition: A | Discharge status: Home / Self Care | Payer: Medicare Other | Attending: Emergency Medicine | Admitting: Emergency Medicine

## 2010-08-31 ENCOUNTER — Ambulatory Visit (INDEPENDENT_AMBULATORY_CARE_PROVIDER_SITE_OTHER): Payer: Medicare Other | Admitting: Gastroenterology

## 2010-08-31 ENCOUNTER — Encounter: Payer: Self-pay | Admitting: Gastroenterology

## 2010-08-31 ENCOUNTER — Ambulatory Visit: Payer: Medicare Other | Admitting: Internal Medicine

## 2010-08-31 ENCOUNTER — Emergency Department (HOSPITAL_COMMUNITY): Payer: Medicare Other

## 2010-08-31 ENCOUNTER — Telehealth: Payer: Self-pay | Admitting: *Deleted

## 2010-08-31 DIAGNOSIS — R109 Unspecified abdominal pain: Secondary | ICD-10-CM | POA: Insufficient documentation

## 2010-08-31 DIAGNOSIS — IMO0001 Reserved for inherently not codable concepts without codable children: Secondary | ICD-10-CM | POA: Insufficient documentation

## 2010-08-31 DIAGNOSIS — I10 Essential (primary) hypertension: Secondary | ICD-10-CM | POA: Insufficient documentation

## 2010-08-31 DIAGNOSIS — F411 Generalized anxiety disorder: Secondary | ICD-10-CM | POA: Insufficient documentation

## 2010-08-31 LAB — POCT I-STAT, CHEM 8
Creatinine, Ser: 0.9 mg/dL (ref 0.50–1.10)
HCT: 45 % (ref 36.0–46.0)
Hemoglobin: 15.3 g/dL — ABNORMAL HIGH (ref 12.0–15.0)
Potassium: 3.8 mEq/L (ref 3.5–5.1)
Sodium: 141 mEq/L (ref 135–145)

## 2010-08-31 LAB — CBC
Hemoglobin: 13.5 g/dL (ref 12.0–15.0)
MCHC: 33.8 g/dL (ref 30.0–36.0)
Platelets: 258 10*3/uL (ref 150–400)
RDW: 15.9 % — ABNORMAL HIGH (ref 11.5–15.5)

## 2010-08-31 LAB — DIFFERENTIAL
Basophils Absolute: 0 10*3/uL (ref 0.0–0.1)
Basophils Relative: 1 % (ref 0–1)
Eosinophils Absolute: 0.1 10*3/uL (ref 0.0–0.7)
Monocytes Absolute: 0.7 10*3/uL (ref 0.1–1.0)
Neutro Abs: 3.2 10*3/uL (ref 1.7–7.7)

## 2010-08-31 NOTE — Telephone Encounter (Signed)
I agree with this plan. Thanks 

## 2010-08-31 NOTE — Patient Instructions (Addendum)
One of your biggest health concerns is your smoking.  This increases your risk for most cancers and serious cardiovascular diseases such as strokes, heart attacks.  You should try your best to stop.  If you need assistence, please contact your PCP or Smoking Cessation Class at [facility] [phone]) or [facility] Quit-Line (1-800-QUIT-NOW). It is not clear that your pains are related to GI tract. You will be set up for a CT scan of abdomen and pelvis with IV and oral contrast (to abdominal pain).  You have had this 2003, 2005, 2007 for right sided abdominal pains without clear explanation. Should follow up with primary care for your chronic back, abdominal pains.   You have been scheduled for a CT scan of the abdomen and pelvis at [facility] ([address]---this is in the same building as [facility]ural [facility]).   You are scheduled on 09/03/10 at 100 pm  You should arrive 15 minutes prior to your appointment time for registration. Please follow the written instructions below on the day of your exam:  1) Do not eat or drink anything after 9 am  (4 hours prior to your test) 2) You have been given 2 bottles of oral contrast to drink. The solution may taste better if refrigerated, but do NOT add ice or any other liquid to this solution. Shake well before drinking.  Drink 1 bottle of contrast @ 11 am (2 hours prior to your exam)  Drink 1 bottle of contrast @ 12 pm (1 hour prior to your exam)  You may take any medications as prescribed with a small amount of water except for the following: Metformin, Glucophage, Glucovance, Avandamet, Riomet, Fortamet, Actoplus Met, Janumet, Glumetza or Metaglip. The above medications must be held the day of the exam and 48 hours after the exam.  The purpose of you drinking the oral contrast is to aid in the visualization of your intestinal tract. The contrast solution may cause some diarrhea. Before your exam is started, you will be given a small  amount of fluid to drink. Depending on your individual set of symptoms, you may also receive an intravenous injection of x-ray contrast/dye.  If you have any questions regarding your exam or if you need to reschedule, you may call the CT department at [phone] between the hours of 8:00 am and 5:00 pm, Monday-Friday.  ________________________________________________________________________

## 2010-08-31 NOTE — Telephone Encounter (Signed)
Pt calls from dr Larae Grooms office c/o abd and back pain stating that dr Christella Hartigan does not feel pain is gi related but has ordered a ct on mon 8/ 6. Pt states she needs something done and she is tired of the "students" that don't know what's wrong with her. She states she needs to see her pcp, she was informed that dr Gilford Rile has no immediate appts. She states she wants pain medicine for her back pain, i asked her if she was being seen for abd pain or back pain and she answered she has started to have the back pain a few days ago. She was offered an appt today and mon and refuses due to she has other obligations. She then ask for wed and was scheduled wed 8/8 at 1015 w/ dr Thad Ranger. She ask what she was to do about pain medicine for her back and was informed she may go to ED for eval of her back pain between now and wed, she states the ED won't see her they will tell her to go to opclinic, i informed her that the ED could not refuse to provide services to her under normal circumstances and she would be seen. She finally stated she would keep the appt in clinic for wed and if needed go to ED. appt was changed 4 times.

## 2010-08-31 NOTE — Progress Notes (Signed)
Review of pertinent gastrointestinal problems: 1. intermittent abdominal pains, right-sided; EGD showed mild gastritis, biopsies showed no H. pylori he June 2012. Abdominal ultrasound was normal. HIDA scan showed normal ejection fraction but she did have pain with CCK injection.  Evaluation by general surgeon recommended against cholecystectomy due to lack of evidence that would help 2 routine risk for colon cancer, colonoscopy 2009 found single small polyp that was hyperplastic. Also hemorrhoids.   HPI: This is a 40 year old woman with chronic abdominal pains of unclear etiology.  Looking back in her records she has had right-sided abdominal pains going back at least 9 years.  CT scan from 2003, 2005, 2007 failed to show any clear etiology. She has had a colonoscopy, she's had an EGD, she has had ultrasound and HIDA scan. Today in the office she was complaining of the same pains, she was getting tearful. She was also holding her back saying she has back pains as well.  She has BM once a day without much pushing or straining. Can have alternating loose stools and minor constipation with mucous.  She has "so many pains going on."     Has been having low back pains.  Sitting, laying, standing can all aggrivate the back pains.   Past Medical History:   Depression with anxiety                                      Hypertension                                                 Anorexia                                                     IBS (irritable bowel syndrome)                               Low back pain                                                Edema leg                                                    Hemorrhoids                                                  Panic attacks                                                Anemia  Comment:Iron Def   Colon polyp                                     2009         Migraines                                                     Colon polyps                                                 Neuromuscular disorder                                         Comment:fibromyalgia   Tonsil pain                                                  Polyp of vocal cord or larynx                                Hidradenitis suppurativa                                    Past Surgical History:   HEMORRHOID SURGERY                                             Comment:with Hidradenitis surgery    UPPER GASTROINTESTINAL ENDOSCOPY                             AXILLARY HIDRADENITIS EXCISION                               INGUINAL HIDRADENITIS EXCISION                               reports that she has been smoking.  She has never used smokeless tobacco. She reports that she does not drink alcohol or use illicit drugs.  family history includes Cancer in her father; Diabetes in her mother; and Heart disease in her father and mother.  There is no history of Colon cancer.    Current medicines and allergies were reviewed in [facility]    Physical Exam: BP 108/72  Pulse 64  Ht 6' (1.829 m)  Wt 245 lb (111.131 kg)  BMI 33.23 kg/m2  LMP 07/29/2010 Constitutional: generally well-appearing Psychiatric: alert and oriented x3 Abdomen: soft, nontender, nondistended, no obvious ascites, no peritoneal signs, normal bowel sounds     Assessment and plan: 39  y.o. female with chronic abdominal pain, back pain  It is not clear that her pains are gastrointestinal related. She has had very thorough workup, see that summarized above. She has not had abdominal cross-sectional imaging in about 5 years and so we will set her up with CT scan at her soonest convenience. I recommended she followup with her primary care physician for her chronic back and abdominal pains since I am unable to clearly identify the cause.

## 2010-09-02 ENCOUNTER — Emergency Department (HOSPITAL_COMMUNITY): Payer: Medicare Other

## 2010-09-02 ENCOUNTER — Emergency Department (HOSPITAL_COMMUNITY)
Admission: EM | Admit: 2010-09-02 | Discharge: 2010-09-02 | Disposition: A | Discharge status: Home / Self Care | Payer: Medicare Other | Attending: Emergency Medicine | Admitting: Emergency Medicine

## 2010-09-02 DIAGNOSIS — G8929 Other chronic pain: Secondary | ICD-10-CM | POA: Insufficient documentation

## 2010-09-02 DIAGNOSIS — M545 Low back pain, unspecified: Secondary | ICD-10-CM | POA: Insufficient documentation

## 2010-09-02 DIAGNOSIS — R109 Unspecified abdominal pain: Secondary | ICD-10-CM | POA: Insufficient documentation

## 2010-09-02 DIAGNOSIS — I10 Essential (primary) hypertension: Secondary | ICD-10-CM | POA: Insufficient documentation

## 2010-09-02 LAB — COMPREHENSIVE METABOLIC PANEL
ALT: 8 U/L (ref 0–35)
AST: 12 U/L (ref 0–37)
Albumin: 3.8 g/dL (ref 3.5–5.2)
CO2: 26 mEq/L (ref 19–32)
Calcium: 8.9 mg/dL (ref 8.4–10.5)
Chloride: 106 mEq/L (ref 96–112)
Creatinine, Ser: 0.76 mg/dL (ref 0.50–1.10)
GFR calc non Af Amer: >60 mL/min (ref >60)
Sodium: 138 mEq/L (ref 135–145)

## 2010-09-02 LAB — URINALYSIS, ROUTINE W REFLEX MICROSCOPIC
Ketones, ur: NEGATIVE mg/dL
Leukocytes, UA: NEGATIVE
Nitrite: NEGATIVE
Protein, ur: NEGATIVE mg/dL
Urobilinogen, UA: 1 mg/dL (ref 0.0–1.0)

## 2010-09-02 LAB — DIFFERENTIAL
Basophils Relative: 1 % (ref 0–1)
Eosinophils Absolute: 0.1 10*3/uL (ref 0.0–0.7)
Eosinophils Relative: 2 % (ref 0–5)
Neutrophils Relative %: 40 % — ABNORMAL LOW (ref 43–77)

## 2010-09-02 LAB — CBC
Platelets: 239 10*3/uL (ref 150–400)
RBC: 4.59 MIL/uL (ref 3.87–5.11)
RDW: 15.8 % — ABNORMAL HIGH (ref 11.5–15.5)
WBC: 5.1 10*3/uL (ref 4.0–10.5)

## 2010-09-02 LAB — POCT PREGNANCY, URINE: Preg Test, Ur: NEGATIVE

## 2010-09-02 IMAGING — CT CT ABD-PELV W/ CM
3 of 4 series · 16 of 46 positions shown, 20 images · IV contrast (APPLIED)
Comparison: [DATE]

CLINICAL DATA: Abdominal pain

CT ABDOMEN AND PELVIS WITH CONTRAST
TECHNIQUE: Multidetector CT imaging of the abdomen and pelvis was
performed following the standard protocol during bolus
administration of intravenous contrast.
Contrast: 125 ml [EZ]

[Series 2: abd_pel 5.0 b40f st · axial · 0.83mm/px · z∈[-494,-134]mm · 12 of 83 slices shown, 16 images]
[im 7/83  soft-tissue]
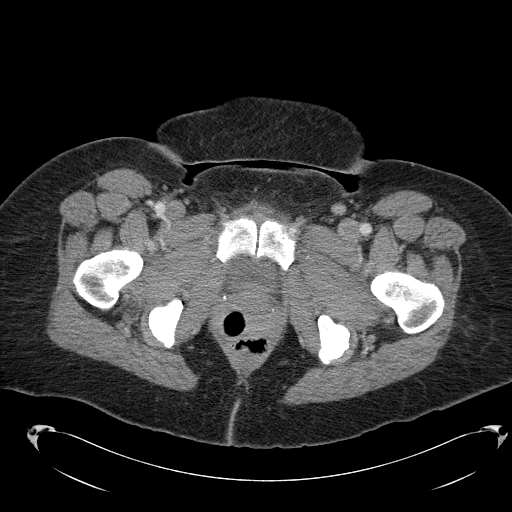
[im 7/83  bone]
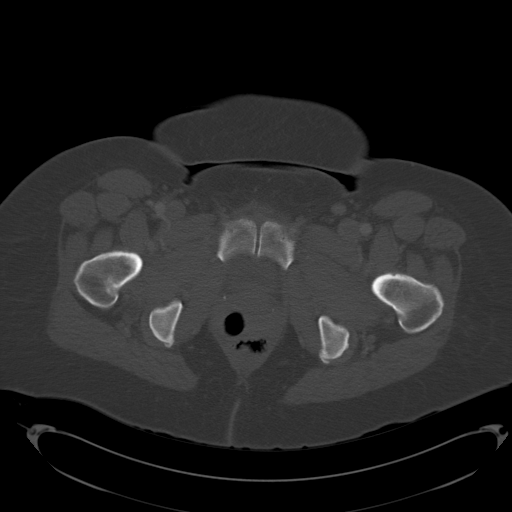
[im 14/83  soft-tissue]
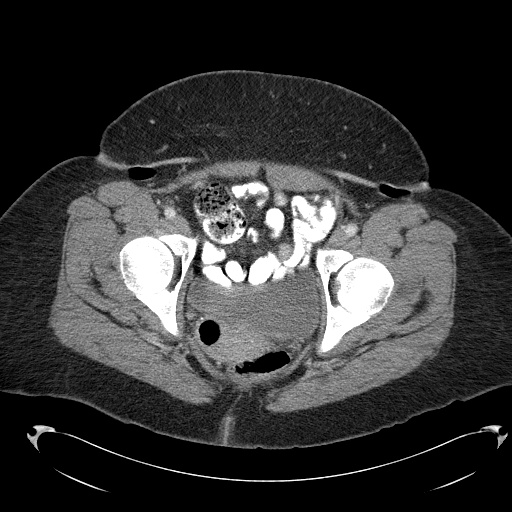
[im 23/83  soft-tissue]
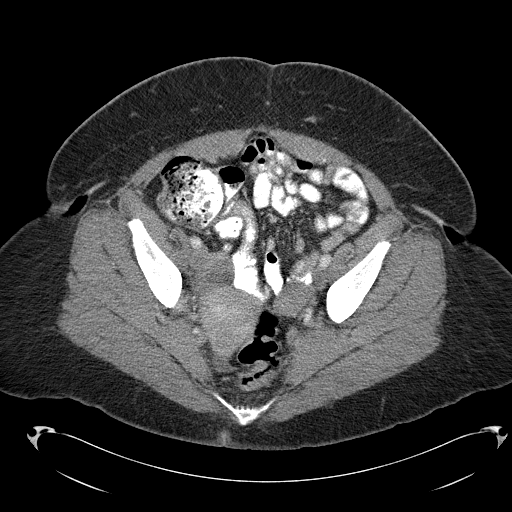
[im 30/83  soft-tissue]
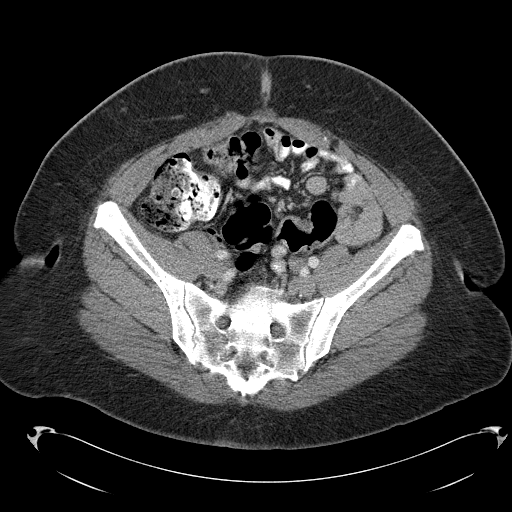
[im 37/83  soft-tissue]
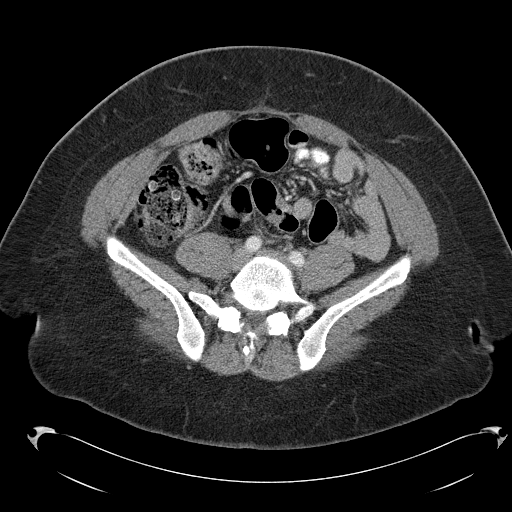
[im 46/83  soft-tissue]
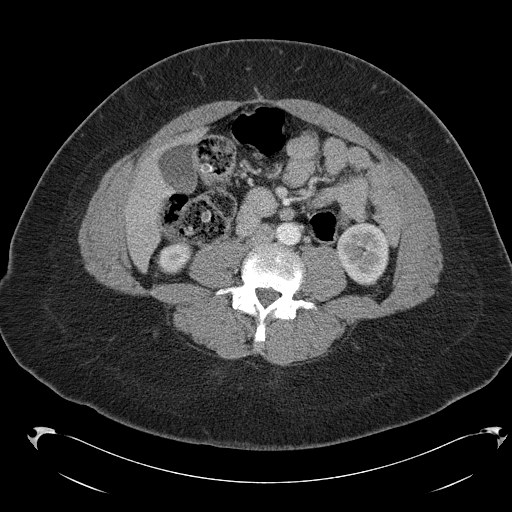
[im 53/83  soft-tissue]
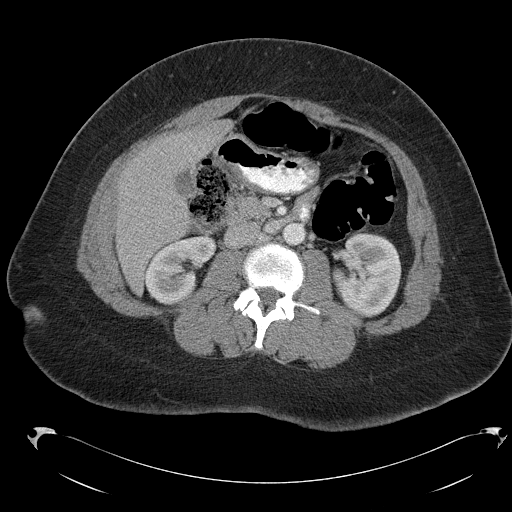
[im 63/83  soft-tissue]
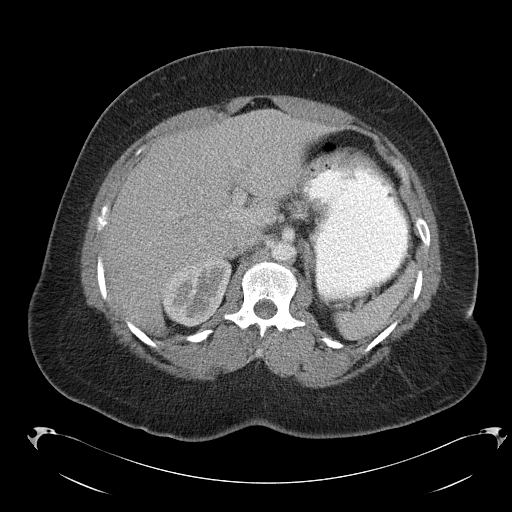
[im 69/83  soft-tissue]
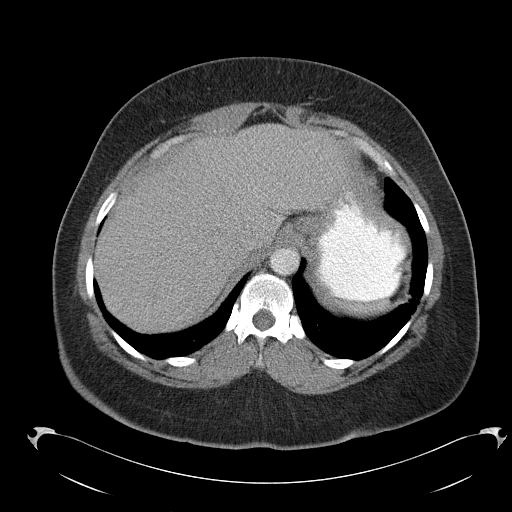
[im 69/83  lung]
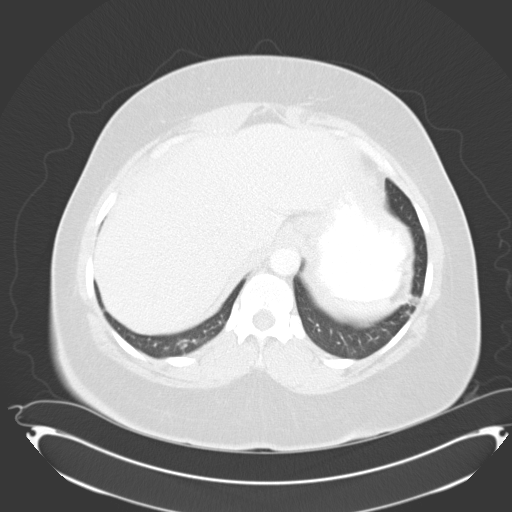
[im 69/83  bone]
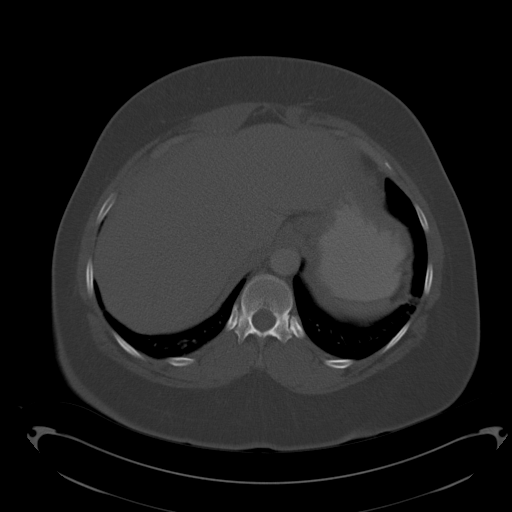
[im 73/83  lung]
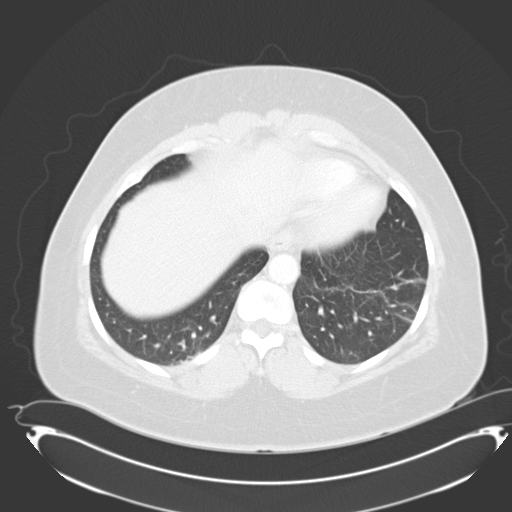
[im 76/83  soft-tissue]
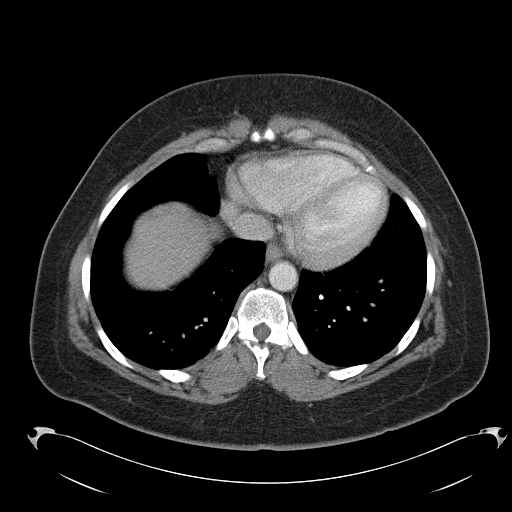
[im 76/83  lung]
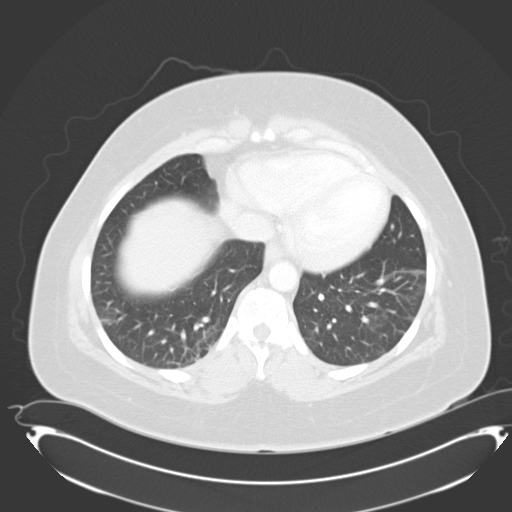
[im 79/83  lung]
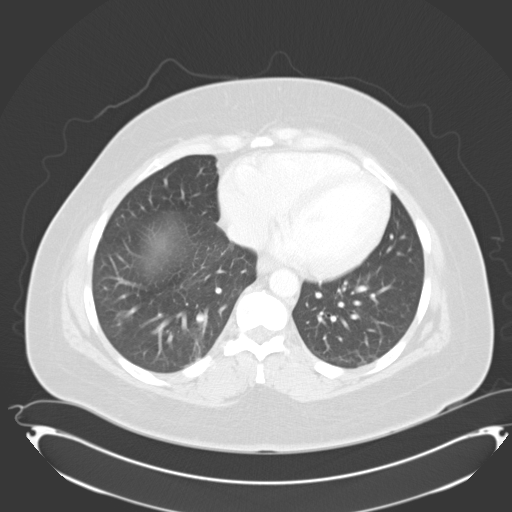

[Series 602: coronal abdomen · coronal · 0.84mm/px · 3 of 154 slices shown]
[im 52/154  soft-tissue]
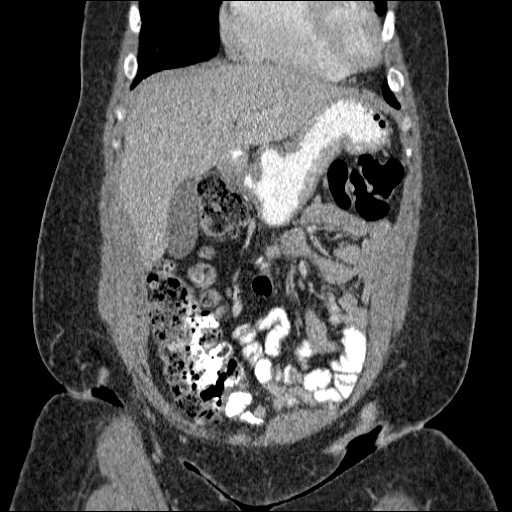
[im 69/154  soft-tissue]
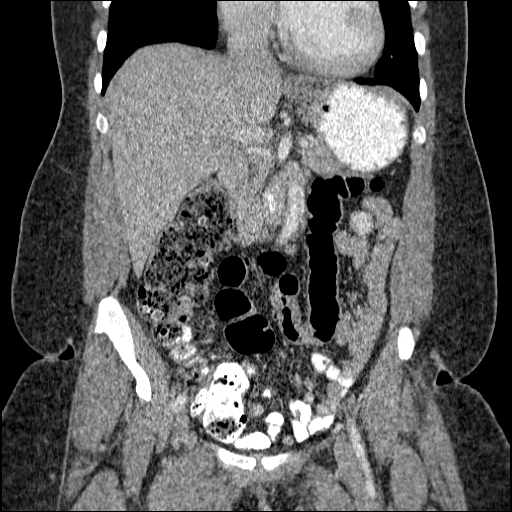
[im 86/154  soft-tissue]
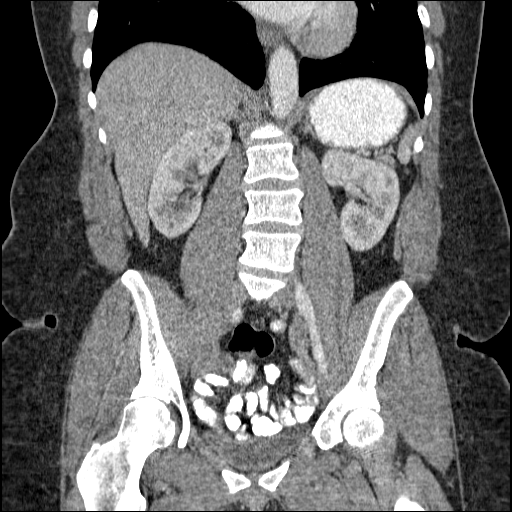

[Series 603: sagittal abdomen · sagittal · 0.84mm/px · 1 of 185 slices shown]
[im 62/185  soft-tissue]
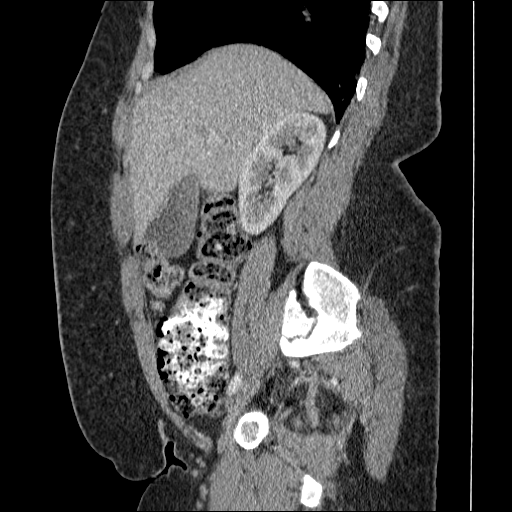

[16 of 46 positions shown; findings below may reference images not displayed]

FINDINGS: Liver, spleen, adrenal glands, kidneys, pancreas are
within normal limits.  Sludge in the gallbladder.  Prominent stool
in the ascending and transverse colon.

Normal appendix.  IUD device is present within the uterus.
Unremarkable bladder.  Adnexa are within normal limits.  Mild
diffuse lumbar degenerative disc disease.
IMPRESSION: No acute intra-abdominal pathology.  Prominent stool.

## 2010-09-02 MED ORDER — IOHEXOL 300 MG/ML  SOLN
125.0000 mL | Freq: Once | INTRAMUSCULAR | Status: AC | PRN
  Administered 2010-09-02: 125 mL via INTRAVENOUS

## 2010-09-03 ENCOUNTER — Other Ambulatory Visit: Payer: Medicare Other

## 2010-09-04 ENCOUNTER — Telehealth: Payer: Self-pay | Admitting: *Deleted
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
## 2010-09-05 ENCOUNTER — Encounter: Payer: Self-pay | Admitting: Internal Medicine

## 2010-09-05 ENCOUNTER — Ambulatory Visit (INDEPENDENT_AMBULATORY_CARE_PROVIDER_SITE_OTHER): Payer: Medicare Other | Admitting: Internal Medicine

## 2010-09-05 ENCOUNTER — Ambulatory Visit: Payer: Medicare Other | Admitting: Internal Medicine

## 2010-09-05 ENCOUNTER — Telehealth: Payer: Self-pay | Admitting: *Deleted

## 2010-09-05 VITALS — BP 107/68 | HR 85 | Temp 99.3°F | Ht 72.0 in | Wt 255.9 lb

## 2010-09-05 DIAGNOSIS — G8929 Other chronic pain: Secondary | ICD-10-CM | POA: Insufficient documentation

## 2010-09-05 DIAGNOSIS — R109 Unspecified abdominal pain: Secondary | ICD-10-CM

## 2010-09-05 DIAGNOSIS — M545 Low back pain: Secondary | ICD-10-CM | POA: Insufficient documentation

## 2010-09-05 MED ORDER — ACETAMINOPHEN 500 MG PO TABS: 500.0000 mg | ORAL_TABLET | Freq: Four times a day (QID) | ORAL | Status: DC

## 2010-09-05 NOTE — Progress Notes (Signed)
Subjective:    Patient ID: Lindsay [name], female    DOB: Dec 20, 1970, 40 y.o.   MRN: [id number]  HPI Pt is a 40 y.o. female who  has a past medical history of Depression with anxiety, colon polyps, chronic abdominal pain and presents to clinic today for the following:  1. Chronic abdominal pain - has been ongoing x 1 year. Pain is in epigastric area and right side, occasionally on left. Has had extensive GI workup with Dr. Christella Hartigan who performed EGD showed mild gastritis, biopsies showed no H. pylori he June 2012. Abdominal ultrasound was normal. HIDA scan showed normal ejection fraction but she did have pain with CCK injection. Evaluation by general surgeon recommended against cholecystectomy due to lack of evidence that would help. Also had colonoscopy 2009 found single small polyp that was hyperplastic. Also hemorrhoids. She actually also went to ER and had a CT abd performed on 08/05 that was negative for acute issues, shows some sludge in the gallbladder. Associated symptoms include nausea. Denies vomiting, fevers, chills, dysuria, hematuria. No history of diabetes or other neurologic conditions. Was recommend a gluten-free diet,   2) Back pain - Patient describes aching back pain located over left > right lower back that involves the flank as well. Rated 10/10 in severity. Aggravated by  physical activity. Alleviated by nothing. denies associated numbness, tingling, dysuria, leg weakness, history of cancer, or history of osteoporosis.   Review of Systems Per HPI.  Current Outpatient Medications Medication Sig  . amoxicillin-clavulanate (AUGMENTIN) 500-125 MG per tablet Take 1 tablet by mouth 3 (three) times daily.    [name] buPROPion (WELLBUTRIN SR) 100 MG 12 hr tablet Take 100 mg by mouth daily.    . clorazepate (TRANXENE) 15 MG tablet Take 15 mg by mouth 2 (two) times daily.    [name] dicyclomine (BENTYL) 20 MG tablet Take 1 tablet (20 mg total) by mouth 4 (four) times daily.  [name] gabapentin  (NEURONTIN) 800 MG tablet Take 800 mg by mouth 3 (three) times daily.    [name] omeprazole (PRILOSEC) 40 MG capsule Take 40 mg by mouth daily.      Allergies Review of patient's allergies indicates no known allergies.  Past Medical History  Diagnosis Date  . Depression with anxiety   . Hypertension   . Anorexia   . IBS (irritable bowel syndrome)   . Low back pain   . Edema leg   . Hemorrhoids   . Panic attacks   . Anemia     Iron Def  . Colon polyp 2009  . Migraines   . Colon polyps   . Neuromuscular disorder     fibromyalgia  . Tonsil pain   . Polyp of vocal cord or larynx   . Hidradenitis suppurativa     Past Surgical History  Procedure Date  . Hemorrhoid surgery     with Hidradenitis surgery   . Upper gastrointestinal endoscopy   . Axillary hidradenitis excision   . Inguinal hidradenitis excision        Objective:   Physical Exam   Filed Vitals:   09/05/10 1326  BP: 107/68  Pulse: 85  Temp: 99.3 F (37.4 C)      General: Vital signs reviewed and noted. Well-developed, well-nourished, in no acute distress; alert, appropriate and cooperative throughout examination.  Head: Normocephalic, atraumatic.  Neck: No deformities, masses, or tenderness noted.  Lungs:  Normal respiratory effort. Clear to auscultation BL without crackles or wheezes.  Heart: RRR. S1 and S2  normal without gallop, murmur, or rubs.  Abdomen:  BS normoactive. Soft, Nondistended, non-tender.  No masses or organomegaly.  Extremities: No pretibial edema. Normal DTRs, muscle strength and unable to assess straight leg raise due to abdominal pain. Tenderness to palpation over lumbar paraspinal musculature. Normal gait.         Assessment & Plan:  Case and plan of care discussed with Dr. Doneen Poisson.

## 2010-09-05 NOTE — Telephone Encounter (Signed)
Return pt.'s call. Pt wanted to know why she was given a rx for Tylenol,which she already has at home.  The pharmacist told her it was OTC medication. Stated she went to the pharmacy for nothing; she should had been told it was an OTC med. I talked to Dr. Saralyn Pilar; stated pt may take 1 or 2 tablets of the Tylenol. No new medication ordered. Pt was informed of this; not very happy.  Stated she will go to the ED.

## 2010-09-05 NOTE — Telephone Encounter (Signed)
I have discussed this patient's case with Dr. Josem Kaufmann, and he agrees that we will not be prescribing narcotic pain medication for this patient. The patient was given Tylenol 500 mg which he can take one to 2 tablets every 6 hours and was recommended to take it around the clock for the next 2 weeks, with subsequent deescalation to when necessary dosing. Although I am sympathetic with the patient's pain, eye, in conjunction with Dr. Josem Kaufmann, have decided that narcotic pain medication is not appropriate or indicated at this time. If she feels that she should ER, that is to the patient's discretion, however she has no red flag symptoms to suggest she needs further evaluation or treatment at this time.  Johnette Abraham, D.O.

## 2010-09-05 NOTE — Patient Instructions (Addendum)
   Please follow-up at the clinic in 1-2 months with your PCP, at which time we will reevaluate your abdominal pain.  You Tylenol only as directed (1 tab every 6 hours x 2 weeks, then only as needed, do not exceed 4 grams/day)  If you have been started on new medication(s), and you develop throat closing, tongue swelling, rash, please stop the medication and call the clinic at [phone] and go to the ER.  Please get your gastric emptying study done, we will follow-up with you regarding the results if abnormal.   If symptoms worsen, or new symptoms arise, please call the clinic or go to the ER.  Please bring all of your medications in a bag to your next visit.    Back Exercises Back exercises help treat and prevent back injuries. The goal of back exercises is to increase the strength of your abdominal and back muscles and the flexibility of your back. These exercises should be started when you no longer have back pain. Back exercises include: 1. Pelvic Tilt - Lie on your back with your knees bent. Tilt your pelvis until the lower part of your back is against the floor. Hold this position 5-10 sec and repeat 5-10 times.  2. Knee to Chest - Pull first one knee up against your chest and hold for 20-30 seconds, repeat this with the other knee, and then both knees. This may be done with the other leg straight or bent, whichever feels better.  3. Sit-Ups or Curl-Ups - Bend your knees 90 degrees. Start with tilting your pelvis, and do a partial, slow sit-up, lifting your trunk only 30-45 degrees off the floor. Take at least 2-3 sec for each sit-up. Do not do sit-ups with your knees out straight. If partial sit-ups are difficult, simply do the above but with only tightening your abdominal muscles and holding it as directed.  4. Hip-Lift - Lie on your back with your knees flexed 90 degrees. Push down with your feet and shoulders as you raise your hips a couple inches off the floor; hold for 10 sec, repeat  5-10 times.  5. Back arches - Lie on your stomach, propping yourself up on bent elbows. Slowly press on your hands, causing an arch in your low back. Repeat 3-5 times. Any initial stiffness and discomfort should lessen with repetition over time.  6. Shoulder-Lifts - Lie face down with arms beside your body. Keep hips and torso pressed to floor as you slowly lift your head and shoulders off the floor.  Do not overdo your exercises, especially in the beginning. Exercises may cause you some mild back discomfort which lasts for a few minutes; however, if the pain is more severe, or lasts for more than 15 minutes, do not continue exercises until you see your caregiver. Improvement with exercise therapy for back problems is slow.   See your caregivers for assistance with developing a proper back exercise program. Document Released: 02/22/2004 Document Re-Released: 04/12/2008 [facility] Patient Information 2011 [place], [place].

## 2010-09-05 NOTE — Assessment & Plan Note (Signed)
Patient has chronic abdominal pain, with no clear etiology. She has had an extensive GI workup including hiatus scan, CT abdomen, EGD within 2012, and colonoscopy (2009) without clear-cut evidence for the source of her pain. She does describe symptoms that may suggest gastroparesis particularly that she gets nauseous even after eating a small amount of food, and has early satiety. Therefore, gastric emptying study seems reasonable at this time. This patient's pain may otherwise be associated with her irritable bowel syndrome versus depression (which has just recently within the past 2 weeks been addressed with behavioral health at which time she was started on Wellbutrin). Patient even had an evaluation by general surgery, who did not feel that she is a candidate for cystectomy at this time given that there is no clear-cut gallbladder pathology noted. - Will continue gluten-free diet as recommended by general surgery. - Will get gastric emptying study to evaluate for evidence of gastroparesis, with consideration of motility agent such as Reglan if indicated - No further imaging aside from as mentioned above is indicated at this time here - We must also make sure that the patient continues to followup with behavioral health for management of her depression and anxiety

## 2010-09-05 NOTE — Assessment & Plan Note (Addendum)
The patient indicates a two-week course of low back pain the only mild diffuse lumbar degenerative disc disease noted per CT abdomen on 09/02/2010. Her physical exam findings are most consistent with lumbar strain, and she has therefore been recommended lumbar stretching exercises. There is no evidence of impingement or radiculopathy these are not consistent with the patient's presenting symptoms. - Will avoid NSAIDs given her complicated chronic abdominal pain. - Will start Tylenol 500 mg, 1-2 tablets every 6 hours for 2 weeks, then decrease to as-needed basis. - Will provide handout regarding back stretching and strengthening exercises. - I spoke with Dr. Josem Kaufmann (attending, program director), and we agreed that narcotics are not indicated at this time. She has not even attempted conservative therapy such as the recommendations for PT/ back stretching/ Ibuprofen that were recommended to her in the ER when she presented for evaluation on 09/02/2010. There is concern of the patient exhibiting red flag behavior, she gets very angry at the thought of not getting strong prescription strength medications for her pain.

## 2010-09-06 ENCOUNTER — Encounter (INDEPENDENT_AMBULATORY_CARE_PROVIDER_SITE_OTHER): Payer: Medicaid Other | Admitting: Marriage and Family Therapist

## 2010-09-06 DIAGNOSIS — F410 Panic disorder [episodic paroxysmal anxiety] without agoraphobia: Secondary | ICD-10-CM

## 2010-09-06 DIAGNOSIS — F339 Major depressive disorder, recurrent, unspecified: Secondary | ICD-10-CM

## 2010-09-13 ENCOUNTER — Encounter (HOSPITAL_BASED_OUTPATIENT_CLINIC_OR_DEPARTMENT_OTHER): Payer: Medicare Other | Admitting: Marriage and Family Therapist

## 2010-09-13 DIAGNOSIS — F410 Panic disorder [episodic paroxysmal anxiety] without agoraphobia: Secondary | ICD-10-CM

## 2010-09-13 DIAGNOSIS — F339 Major depressive disorder, recurrent, unspecified: Secondary | ICD-10-CM

## 2010-09-14 ENCOUNTER — Other Ambulatory Visit (HOSPITAL_COMMUNITY): Payer: Medicare Other

## 2010-09-16 ENCOUNTER — Emergency Department (HOSPITAL_COMMUNITY)
Admission: EM | Admit: 2010-09-16 | Discharge: 2010-09-16 | Disposition: A | Discharge status: Home / Self Care | Payer: Medicare Other | Attending: Emergency Medicine | Admitting: Emergency Medicine

## 2010-09-16 DIAGNOSIS — G8929 Other chronic pain: Secondary | ICD-10-CM | POA: Insufficient documentation

## 2010-09-16 DIAGNOSIS — I10 Essential (primary) hypertension: Secondary | ICD-10-CM | POA: Insufficient documentation

## 2010-09-16 DIAGNOSIS — R112 Nausea with vomiting, unspecified: Secondary | ICD-10-CM | POA: Insufficient documentation

## 2010-09-16 DIAGNOSIS — M549 Dorsalgia, unspecified: Secondary | ICD-10-CM | POA: Insufficient documentation

## 2010-09-16 DIAGNOSIS — Z79899 Other long term (current) drug therapy: Secondary | ICD-10-CM | POA: Insufficient documentation

## 2010-09-19 ENCOUNTER — Encounter (HOSPITAL_COMMUNITY): Payer: Medicare Other | Admitting: Marriage and Family Therapist

## 2010-09-20 ENCOUNTER — Encounter (HOSPITAL_COMMUNITY): Payer: Medicare Other | Admitting: Physician Assistant

## 2010-09-20 ENCOUNTER — Encounter (HOSPITAL_COMMUNITY): Payer: Medicare Other | Admitting: Marriage and Family Therapist

## 2010-09-27 ENCOUNTER — Encounter (HOSPITAL_COMMUNITY): Payer: Medicare Other | Admitting: Marriage and Family Therapist

## 2010-10-04 ENCOUNTER — Encounter (INDEPENDENT_AMBULATORY_CARE_PROVIDER_SITE_OTHER): Payer: Medicare Other | Admitting: Marriage and Family Therapist

## 2010-10-04 DIAGNOSIS — F411 Generalized anxiety disorder: Secondary | ICD-10-CM

## 2010-10-11 ENCOUNTER — Encounter (INDEPENDENT_AMBULATORY_CARE_PROVIDER_SITE_OTHER): Payer: Medicare Other | Admitting: Marriage and Family Therapist

## 2010-10-11 DIAGNOSIS — F411 Generalized anxiety disorder: Secondary | ICD-10-CM

## 2010-10-11 DIAGNOSIS — F339 Major depressive disorder, recurrent, unspecified: Secondary | ICD-10-CM

## 2010-10-16 ENCOUNTER — Telehealth: Payer: Self-pay | Admitting: Gastroenterology

## 2010-10-16 NOTE — Telephone Encounter (Signed)
Pt was advised to call the prescribing Dr and advise them that the med has caused constipation,  She has been taking Miralax, but wanted to know abut the welbutrin.  She was again advised to call her Prescribing Dr.

## 2010-10-17 ENCOUNTER — Telehealth: Payer: Self-pay | Admitting: *Deleted

## 2010-10-17 DIAGNOSIS — R110 Nausea: Secondary | ICD-10-CM

## 2010-10-17 MED ORDER — PROMETHAZINE HCL 12.5 MG PO TABS: 12.5000 mg | ORAL_TABLET | Freq: Four times a day (QID) | ORAL | Status: DC | PRN

## 2010-10-17 NOTE — Telephone Encounter (Signed)
Promethazine prescription was sent to [facility] as requested.  Please call Lindsay [name] to advise that her request for anti-nausea medication has been sent to [facility].  Thanks.

## 2010-10-17 NOTE — Telephone Encounter (Signed)
Call from pt stating that she would prefer to get the Phenergan as a suppository because of her Tonsillectomy tomorrow.  Dr. Coralee Pesa was consulted prescription changed to Promethazine 12/5 mg suppositories .  Po order was cancelled.

## 2010-10-17 NOTE — Telephone Encounter (Signed)
Call from pt said that she is having nausea.  Pt said that she is scheduled to have her Tonsils removed on tomorrow.  [facility] that she will need to contact her PCP.  No fever or chills.  Has had the nausea for awhile.  Not vomiting at present.  Wants to know if she can get something for this.  Med [facility] to [facility] on [address].

## 2010-10-18 ENCOUNTER — Other Ambulatory Visit: Payer: Self-pay | Admitting: Otolaryngology

## 2010-10-18 ENCOUNTER — Encounter (HOSPITAL_COMMUNITY): Payer: Medicare Other | Admitting: Marriage and Family Therapist

## 2010-10-18 HISTORY — PX: TONSILLECTOMY: SUR1361

## 2010-10-18 LAB — COMPREHENSIVE METABOLIC PANEL
ALT: 11
AST: 16
Albumin: 4.1
Alkaline Phosphatase: 53
BUN: 7
Chloride: 107
GFR calc Af Amer: >60
Potassium: 3.4 — ABNORMAL LOW
Sodium: 135
Total Bilirubin: 0.6
Total Protein: 7.1

## 2010-10-18 LAB — DIFFERENTIAL
Basophils Absolute: 0
Basophils Relative: 0
Eosinophils Relative: 0
Monocytes Absolute: 0.8
Monocytes Relative: 12
Neutro Abs: 4.8

## 2010-10-18 LAB — CBC
HCT: 42.5
Platelets: 317
RDW: 16.2 — ABNORMAL HIGH
WBC: 6.6

## 2010-10-18 LAB — URINALYSIS, ROUTINE W REFLEX MICROSCOPIC
Glucose, UA: NEGATIVE
Specific Gravity, Urine: >1.03 — ABNORMAL HIGH
Urobilinogen, UA: 0.2

## 2010-10-18 LAB — URINE MICROSCOPIC-ADD ON

## 2010-10-18 LAB — POCT PREGNANCY, URINE: Operator id: 294341

## 2010-10-24 ENCOUNTER — Encounter (INDEPENDENT_AMBULATORY_CARE_PROVIDER_SITE_OTHER): Payer: Medicare Other | Admitting: Marriage and Family Therapist

## 2010-10-24 DIAGNOSIS — F411 Generalized anxiety disorder: Secondary | ICD-10-CM

## 2010-10-24 DIAGNOSIS — F339 Major depressive disorder, recurrent, unspecified: Secondary | ICD-10-CM

## 2010-10-24 LAB — URINALYSIS, ROUTINE W REFLEX MICROSCOPIC
Glucose, UA: NEGATIVE
Ketones, ur: NEGATIVE
Protein, ur: NEGATIVE
Urobilinogen, UA: 0.2

## 2010-10-24 LAB — PROTIME-INR
INR: 1
Prothrombin Time: 13.7

## 2010-10-24 LAB — APTT: aPTT: 32

## 2010-10-24 LAB — CBC
MCV: 82.8
Platelets: 281
RBC: 4.44
WBC: 6

## 2010-10-24 LAB — DIFFERENTIAL
Eosinophils Absolute: 0.1
Lymphocytes Relative: 31
Lymphs Abs: 1.9
Monocytes Relative: 12
Neutro Abs: 3.3
Neutrophils Relative %: 54

## 2010-10-24 LAB — BASIC METABOLIC PANEL
BUN: 5 — ABNORMAL LOW
Calcium: 8.5
Chloride: 108
Creatinine, Ser: 0.64
GFR calc Af Amer: >60
GFR calc non Af Amer: >60

## 2010-10-25 LAB — BASIC METABOLIC PANEL
CO2: 27
Chloride: 108
Creatinine, Ser: 0.71
GFR calc Af Amer: >60
Glucose, Bld: 84
Sodium: 141

## 2010-10-25 LAB — HEMOGLOBIN AND HEMATOCRIT, BLOOD: HCT: 36.7

## 2010-10-30 ENCOUNTER — Encounter (INDEPENDENT_AMBULATORY_CARE_PROVIDER_SITE_OTHER): Payer: Medicare Other | Admitting: Physician Assistant

## 2010-10-30 DIAGNOSIS — F329 Major depressive disorder, single episode, unspecified: Secondary | ICD-10-CM

## 2010-10-30 DIAGNOSIS — F410 Panic disorder [episodic paroxysmal anxiety] without agoraphobia: Secondary | ICD-10-CM

## 2010-10-30 LAB — RAPID STREP SCREEN (MED CTR MEBANE ONLY): Streptococcus, Group A Screen (Direct): NEGATIVE

## 2010-11-01 ENCOUNTER — Telehealth: Payer: Self-pay | Admitting: *Deleted

## 2010-11-01 ENCOUNTER — Encounter (INDEPENDENT_AMBULATORY_CARE_PROVIDER_SITE_OTHER): Payer: Medicare Other | Admitting: Marriage and Family Therapist

## 2010-11-01 DIAGNOSIS — F339 Major depressive disorder, recurrent, unspecified: Secondary | ICD-10-CM

## 2010-11-01 DIAGNOSIS — F410 Panic disorder [episodic paroxysmal anxiety] without agoraphobia: Secondary | ICD-10-CM

## 2010-11-01 NOTE — Telephone Encounter (Signed)
Call from pt said that her insurance will not cover the Phenergan Suppositories.  Pt would now like for the prescription to be changed back to the pills if possible.

## 2010-11-08 ENCOUNTER — Encounter (INDEPENDENT_AMBULATORY_CARE_PROVIDER_SITE_OTHER): Payer: Medicare Other | Admitting: Marriage and Family Therapist

## 2010-11-08 DIAGNOSIS — F339 Major depressive disorder, recurrent, unspecified: Secondary | ICD-10-CM

## 2010-11-08 DIAGNOSIS — F410 Panic disorder [episodic paroxysmal anxiety] without agoraphobia: Secondary | ICD-10-CM

## 2010-11-08 LAB — POCT CARDIAC MARKERS
CKMB, poc: <1 — ABNORMAL LOW
Myoglobin, poc: 90.1
Troponin i, poc: <0.05

## 2010-11-15 ENCOUNTER — Encounter (INDEPENDENT_AMBULATORY_CARE_PROVIDER_SITE_OTHER): Payer: Medicare Other | Admitting: Marriage and Family Therapist

## 2010-11-15 DIAGNOSIS — F411 Generalized anxiety disorder: Secondary | ICD-10-CM

## 2010-11-15 DIAGNOSIS — F339 Major depressive disorder, recurrent, unspecified: Secondary | ICD-10-CM

## 2010-11-21 ENCOUNTER — Encounter (HOSPITAL_COMMUNITY): Payer: Medicare Other | Admitting: Marriage and Family Therapist

## 2010-11-22 ENCOUNTER — Encounter (HOSPITAL_COMMUNITY): Payer: Medicare Other | Admitting: Marriage and Family Therapist

## 2010-11-27 ENCOUNTER — Encounter: Payer: Self-pay | Admitting: Internal Medicine

## 2010-11-27 ENCOUNTER — Ambulatory Visit (INDEPENDENT_AMBULATORY_CARE_PROVIDER_SITE_OTHER): Payer: Medicare Other | Admitting: Internal Medicine

## 2010-11-27 DIAGNOSIS — B373 Candidiasis of vulva and vagina: Secondary | ICD-10-CM | POA: Insufficient documentation

## 2010-11-27 DIAGNOSIS — R51 Headache: Secondary | ICD-10-CM

## 2010-11-27 DIAGNOSIS — F3289 Other specified depressive episodes: Secondary | ICD-10-CM

## 2010-11-27 DIAGNOSIS — F329 Major depressive disorder, single episode, unspecified: Secondary | ICD-10-CM

## 2010-11-27 DIAGNOSIS — R110 Nausea: Secondary | ICD-10-CM

## 2010-11-27 DIAGNOSIS — Z23 Encounter for immunization: Secondary | ICD-10-CM

## 2010-11-27 DIAGNOSIS — B3731 Acute candidiasis of vulva and vagina: Secondary | ICD-10-CM | POA: Insufficient documentation

## 2010-11-27 MED ORDER — HYDROCODONE-ACETAMINOPHEN 5-500 MG PO TABS: 2.0000 | ORAL_TABLET | Freq: Four times a day (QID) | ORAL | Status: DC | PRN

## 2010-11-27 MED ORDER — FLUCONAZOLE 150 MG PO TABS: 150.0000 mg | ORAL_TABLET | Freq: Once | ORAL | Status: DC

## 2010-11-27 MED ORDER — PROMETHAZINE HCL 12.5 MG PO TABS: 12.5000 mg | ORAL_TABLET | Freq: Four times a day (QID) | ORAL | Status: DC | PRN

## 2010-11-27 NOTE — Patient Instructions (Signed)
Please take medications as prescribed.

## 2010-11-27 NOTE — Assessment & Plan Note (Signed)
Patient having chronic migraine, unable to take Topamax given that her insurance does not pay for it, and also unable to afford sumatriptan. Recommended patient take nonsteroidal anti-inflammatory medications for mild headaches, and I gave her prescription of Vicodin for severe headaches.

## 2010-11-27 NOTE — Assessment & Plan Note (Signed)
Stable, followed up by a behavioral health

## 2010-11-27 NOTE — Progress Notes (Signed)
  Subjective:    Patient ID: Lindsay [name], female    DOB: 1970-02-08, 40 y.o.   MRN: [id number]  HPI  Pt is a 40 y.o. female who has a past medical history of Depression with anxiety, colon polyps, chronic abdominal pain which had been fully worked up in the past by Dr. Christella Hartigan, findings were negative. Today presents to clinic today for the following:  1. Vaginal discharge ongoing for several days, described as white thick, occurred post antibiotic treatment and is associated with mild abdominal tenderness. 2) headache and nausea, this is likely related to chronic migraine, patient is unable to the go to her headaches and her due to insurance difficulties, and the patient is unable to afford a triptan, we discussed other possibilities for treatment.  She denies any fever chills nausea vomiting, or any other complaints  Review of Systems  Gastrointestinal: Positive for abdominal pain.  Genitourinary: Positive for vaginal discharge.  Neurological: Positive for weakness and headaches.  Psychiatric/Behavioral: Positive for decreased concentration.  All other systems reviewed and are negative.       Objective:   Physical Exam  Vitals reviewed. Constitutional: She is oriented to person, place, and time. She appears well-developed and well-nourished.  HENT:  Head: Normocephalic and atraumatic.  Eyes: Pupils are equal, round, and reactive to light.  Neck: Normal range of motion. Neck supple. No JVD present. No thyromegaly present.  Cardiovascular: Normal rate, regular rhythm and normal heart sounds.   No murmur heard. Pulmonary/Chest: Effort normal and breath sounds normal. She has no wheezes. She has no rales.  Abdominal: Soft. Bowel sounds are normal.  Genitourinary: Vaginal discharge found.  Musculoskeletal: Normal range of motion. She exhibits no edema.  Neurological: She is alert and oriented to person, place, and time.  Skin: Skin is warm and dry.          Assessment & Plan:

## 2010-11-27 NOTE — Assessment & Plan Note (Signed)
His post antibiotic treatment, she has thick white discharge and no other complaints denies any fever. Empirically treat with Diflucan. If symptoms do not resolve will need to perform a pelvic exam and cultures for definitive diagnosis

## 2010-11-29 ENCOUNTER — Encounter (INDEPENDENT_AMBULATORY_CARE_PROVIDER_SITE_OTHER): Payer: Medicare Other | Admitting: Marriage and Family Therapist

## 2010-11-29 DIAGNOSIS — F339 Major depressive disorder, recurrent, unspecified: Secondary | ICD-10-CM

## 2010-11-29 DIAGNOSIS — F410 Panic disorder [episodic paroxysmal anxiety] without agoraphobia: Secondary | ICD-10-CM

## 2010-12-06 ENCOUNTER — Ambulatory Visit (HOSPITAL_COMMUNITY): Payer: Medicare Other | Admitting: Marriage and Family Therapist

## 2010-12-18 ENCOUNTER — Other Ambulatory Visit: Payer: Self-pay | Admitting: Internal Medicine

## 2010-12-19 ENCOUNTER — Ambulatory Visit (HOSPITAL_COMMUNITY): Payer: Medicare Other | Admitting: Marriage and Family Therapist

## 2010-12-27 ENCOUNTER — Ambulatory Visit (INDEPENDENT_AMBULATORY_CARE_PROVIDER_SITE_OTHER): Payer: Medicare Other | Admitting: Marriage and Family Therapist

## 2010-12-27 ENCOUNTER — Other Ambulatory Visit (HOSPITAL_COMMUNITY): Payer: Self-pay | Admitting: Physician Assistant

## 2010-12-27 DIAGNOSIS — F329 Major depressive disorder, single episode, unspecified: Secondary | ICD-10-CM

## 2010-12-27 DIAGNOSIS — F331 Major depressive disorder, recurrent, moderate: Secondary | ICD-10-CM

## 2010-12-27 DIAGNOSIS — F410 Panic disorder [episodic paroxysmal anxiety] without agoraphobia: Secondary | ICD-10-CM

## 2010-12-27 NOTE — Progress Notes (Signed)
   THERAPIST PROGRESS NOTE  Session Time: 2:00 - 3:00 p.m.  Participation Level: Active  Behavioral Response: Well GroomedAlertAnxious and Depressed  Type of Therapy: Individual Therapy  Treatment Goals addressed: Coping  Interventions: CBT, Strength-based, Assertiveness Training, Supportive and Anger Management Training  Summary: Lindsay [name] is a 40 y.o. female who presents with anxiety and depression.  She was referred by [name], PA.  Patient's demeanor showed patient as calm, she was laughing and telling stories during the session.  However, when asked to rate her anxiety and depression over the past two days she rated them a "7-8" from a 0-10 scale.  Patient stated it had to do with how she was feeling physically because of her recent surgery.  Patient also discussed what she was doing to change her life and talked about getting baptized this Sunday because it is her birthday.  She also reports starting school for her GED because she got a ride.  Patient also talked about how she is trying not to get angry with others which is why she was acting calm in the session.  Suicidal/Homicidal: Nowithout intent/plan  Therapist Response: Helped patient identify how her behavior (not being angry) was not matching her rating and whether or not her thinking was distorted.  Also talked about patient making changes to see if she would follow through in the future with same.  Plan: Return again in 2 weeks.  Diagnosis: Axis I: Major depression, current, moderate; anxiety disorder, panic type    Axis II: Deferred    Dahlila Pfahler, LMFT 12/27/2010

## 2011-01-01 ENCOUNTER — Ambulatory Visit (HOSPITAL_COMMUNITY): Payer: Medicare Other | Admitting: Physician Assistant

## 2011-01-07 ENCOUNTER — Ambulatory Visit (HOSPITAL_COMMUNITY): Payer: Medicare Other | Admitting: Marriage and Family Therapist

## 2011-01-07 NOTE — Progress Notes (Unsigned)
   THERAPIST PROGRESS NOTE  Session Time: 3:00 - 4:00 p.m.  Participation Level: Did Not Attend  Behavioral Response:   Type of Therapy: Individual Therapy  Treatment Goals addressed:   Interventions:   Summary: Lindsay [name] is a 40 y.o. female who presents with anxiety and depression.  She was referred by [name], PA. Patient did not show up for her appointment.  States she got the date wrong.  Suicidal/Homicidal:   Therapist Response: ***  Plan: Return again in 2 weeks.  Diagnosis: Axis I: Anxiety D/O, panic type; Major depressive D/O, moderate    Axis II: Deferred    Saintclair Schroader, LMFT 01/07/2011

## 2011-01-09 ENCOUNTER — Ambulatory Visit (HOSPITAL_COMMUNITY): Payer: Medicare Other | Admitting: Marriage and Family Therapist

## 2011-01-09 ENCOUNTER — Ambulatory Visit (INDEPENDENT_AMBULATORY_CARE_PROVIDER_SITE_OTHER): Payer: Medicare Other | Admitting: Marriage and Family Therapist

## 2011-01-09 ENCOUNTER — Other Ambulatory Visit (HOSPITAL_COMMUNITY): Payer: Self-pay

## 2011-01-09 DIAGNOSIS — F410 Panic disorder [episodic paroxysmal anxiety] without agoraphobia: Secondary | ICD-10-CM

## 2011-01-09 DIAGNOSIS — F331 Major depressive disorder, recurrent, moderate: Secondary | ICD-10-CM

## 2011-01-09 NOTE — Progress Notes (Signed)
   THERAPIST PROGRESS NOTE  Session Time: 2:00 - 3:00 p.m.  Participation Level: Active  Behavioral Response: Well GroomedAlertAnxious and Depressed  Type of Therapy: Individual Therapy  Treatment Goals addressed: Coping  Interventions: Strength-based, Supportive and Family Systems  Summary: Lindsay [name] is a 40 y.o. female who presents with anxiety and depression.  She was referred by [name], PA. Patient reports that on 01/06/11 she finally got baptized.  She reports that since that time although she is behaving in the same manner as before (getting angry, cursing, smoked cigarettes and marijuana) there is a voice inside her that is reminding her that she is now baptized.  Patient also states that the church has suggested that she be part of grant applying since she has some skills in that area.  Overall patient was happy during the session, smiling and laughing.   Suicidal/Homicidal: Nowithout intent/plan  Therapist Response: Continue to work on helping patient to grow in a healthy way by using her experience of being baptized.  Plan: Return again in 2 weeks.  Diagnosis: Axis I: Major depressive d/o; anxiety d/o, panic type    Axis II: Deferred    Brylea Pita, LMFT 01/09/2011

## 2011-01-12 ENCOUNTER — Other Ambulatory Visit: Payer: Self-pay | Admitting: Internal Medicine

## 2011-01-14 ENCOUNTER — Ambulatory Visit (HOSPITAL_COMMUNITY): Payer: Medicare Other | Admitting: Marriage and Family Therapist

## 2011-01-18 ENCOUNTER — Emergency Department (HOSPITAL_COMMUNITY): Payer: Medicare Other

## 2011-01-18 ENCOUNTER — Emergency Department (HOSPITAL_COMMUNITY)
Admission: EM | Admit: 2011-01-18 | Discharge: 2011-01-18 | Payer: Medicare Other | Attending: Emergency Medicine | Admitting: Emergency Medicine

## 2011-01-18 ENCOUNTER — Encounter (HOSPITAL_COMMUNITY): Payer: Self-pay

## 2011-01-18 ENCOUNTER — Ambulatory Visit (INDEPENDENT_AMBULATORY_CARE_PROVIDER_SITE_OTHER): Payer: Medicare Other | Admitting: Marriage and Family Therapist

## 2011-01-18 DIAGNOSIS — F341 Dysthymic disorder: Secondary | ICD-10-CM | POA: Insufficient documentation

## 2011-01-18 DIAGNOSIS — F17200 Nicotine dependence, unspecified, uncomplicated: Secondary | ICD-10-CM | POA: Insufficient documentation

## 2011-01-18 DIAGNOSIS — R197 Diarrhea, unspecified: Secondary | ICD-10-CM | POA: Insufficient documentation

## 2011-01-18 DIAGNOSIS — M542 Cervicalgia: Secondary | ICD-10-CM | POA: Insufficient documentation

## 2011-01-18 DIAGNOSIS — Z79899 Other long term (current) drug therapy: Secondary | ICD-10-CM | POA: Insufficient documentation

## 2011-01-18 DIAGNOSIS — F410 Panic disorder [episodic paroxysmal anxiety] without agoraphobia: Secondary | ICD-10-CM

## 2011-01-18 DIAGNOSIS — R519 Headache, unspecified: Secondary | ICD-10-CM

## 2011-01-18 DIAGNOSIS — I10 Essential (primary) hypertension: Secondary | ICD-10-CM | POA: Insufficient documentation

## 2011-01-18 DIAGNOSIS — F331 Major depressive disorder, recurrent, moderate: Secondary | ICD-10-CM

## 2011-01-18 DIAGNOSIS — J029 Acute pharyngitis, unspecified: Secondary | ICD-10-CM

## 2011-01-18 DIAGNOSIS — R51 Headache: Secondary | ICD-10-CM | POA: Insufficient documentation

## 2011-01-18 DIAGNOSIS — R112 Nausea with vomiting, unspecified: Secondary | ICD-10-CM

## 2011-01-18 LAB — DIFFERENTIAL
Basophils Absolute: 0 10*3/uL (ref 0.0–0.1)
Basophils Relative: 0 % (ref 0–1)
Monocytes Absolute: 0.6 10*3/uL (ref 0.1–1.0)
Neutro Abs: 3.3 10*3/uL (ref 1.7–7.7)
Neutrophils Relative %: 47 % (ref 43–77)

## 2011-01-18 LAB — CBC
MCHC: 33.6 g/dL (ref 30.0–36.0)
Platelets: 269 10*3/uL (ref 150–400)
RDW: 15.7 % — ABNORMAL HIGH (ref 11.5–15.5)
WBC: 7.1 10*3/uL (ref 4.0–10.5)

## 2011-01-18 LAB — BASIC METABOLIC PANEL
BUN: 6 mg/dL (ref 6–23)
Calcium: 9.1 mg/dL (ref 8.4–10.5)
Creatinine, Ser: 0.68 mg/dL (ref 0.50–1.10)
GFR calc Af Amer: >90 mL/min (ref >90)
GFR calc non Af Amer: >90 mL/min (ref >90)

## 2011-01-18 IMAGING — CT CT NECK W/ CM
3 of 4 series · 11 of 20 positions shown, 13 images · IV contrast (100 ML OMNI 300)
Comparison: None.

CLINICAL DATA: Status post tonsillectomy.  Pain in back of throat.
Difficulty opening mouth.

CT NECK WITH CONTRAST
TECHNIQUE: Multidetector CT imaging of the neck was performed with
intravenous contrast.
Contrast: 100mL OMNIPAQUE IOHEXOL 300 MG/ML IV SOLN

[Series 2: neck st · axial · 0.39mm/px · z∈[-163,-63]mm · 3 of 100 slices shown]
[im 25/100  bone]
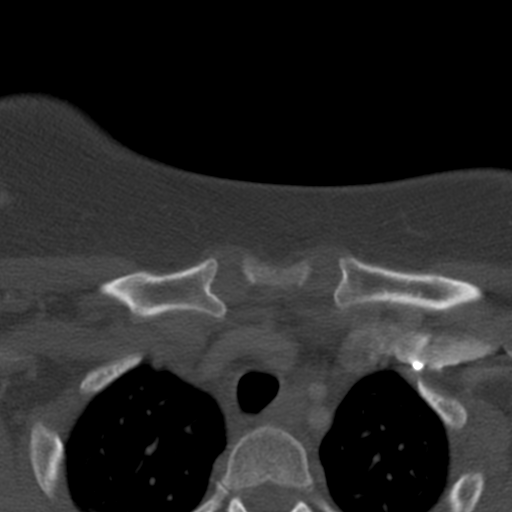
[im 50/100  bone]
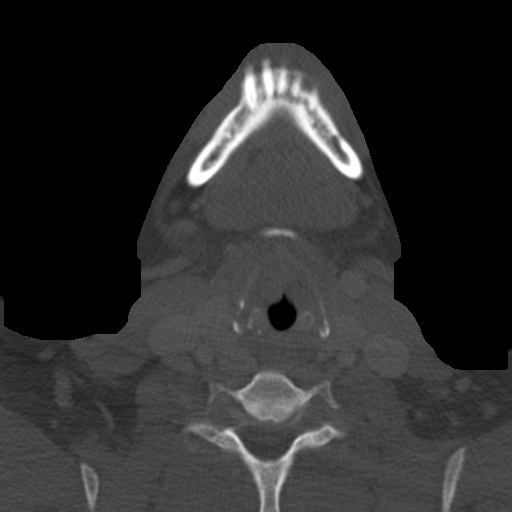
[im 75/100  bone]
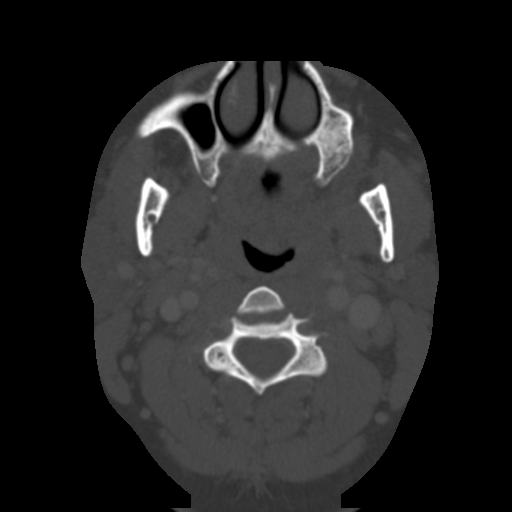

[Series 602: axial reformats · axial · 0.39mm/px · z∈[-228,-74]mm · 5 of 125 slices shown, 7 images]
[im 21/125  soft-tissue]
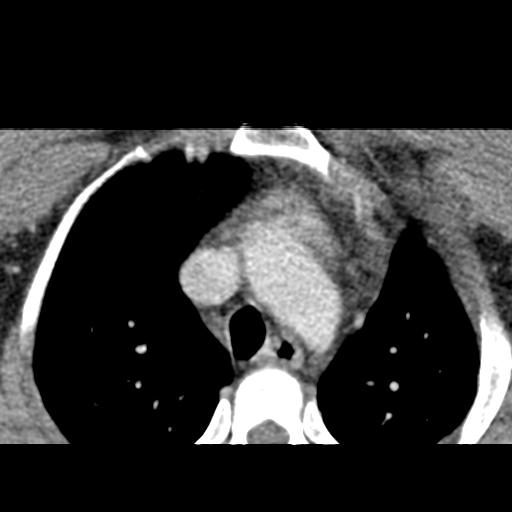
[im 21/125  bone]
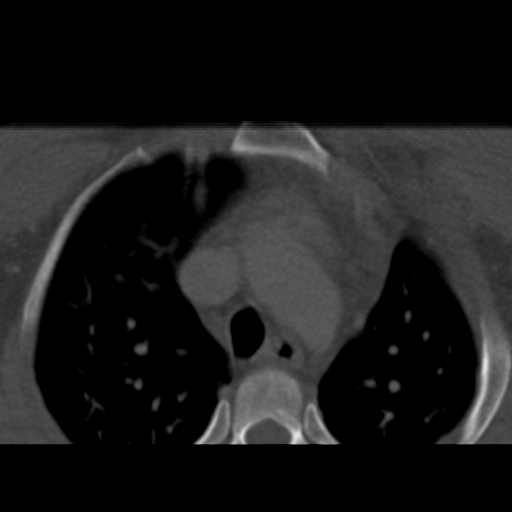
[im 42/125  bone]
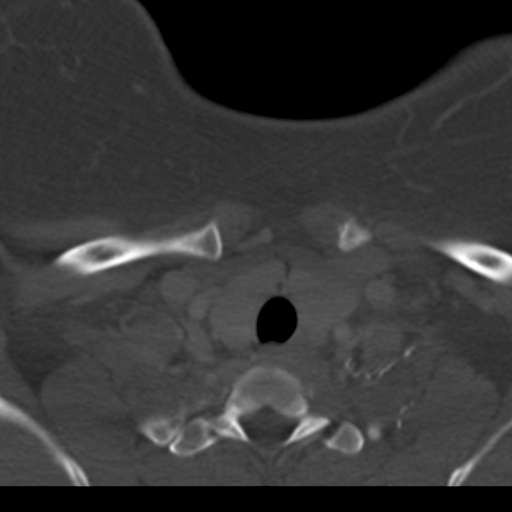
[im 63/125  bone]
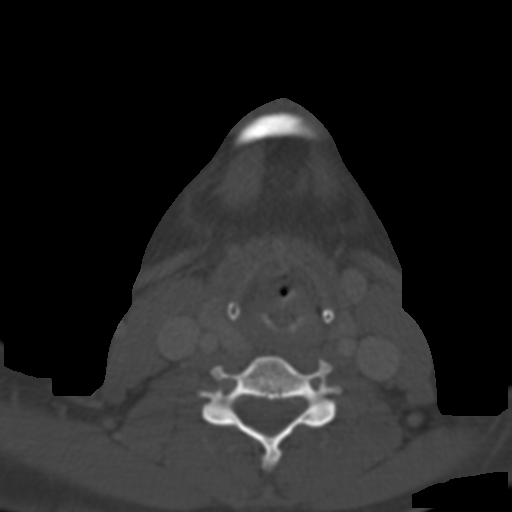
[im 83/125  bone]
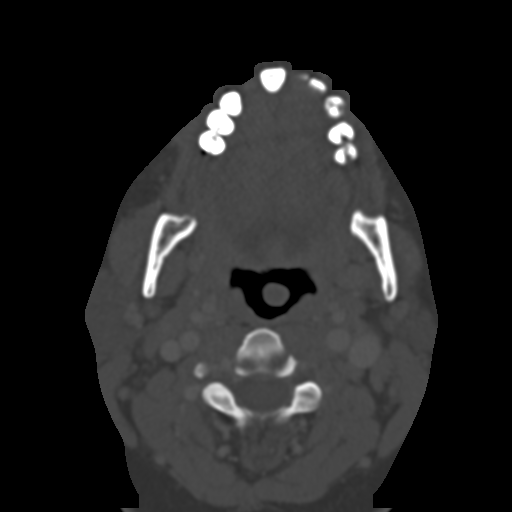
[im 104/125  soft-tissue]
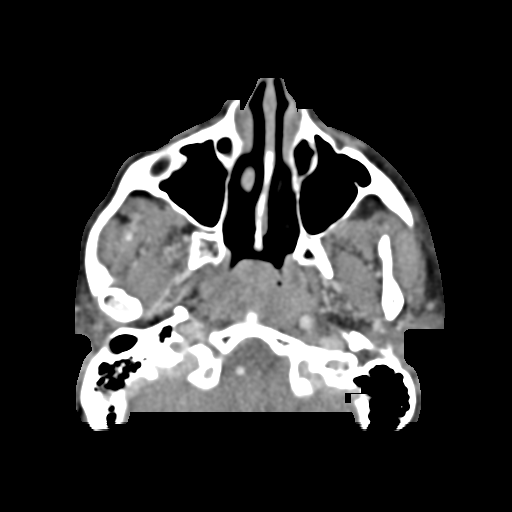
[im 104/125  bone]
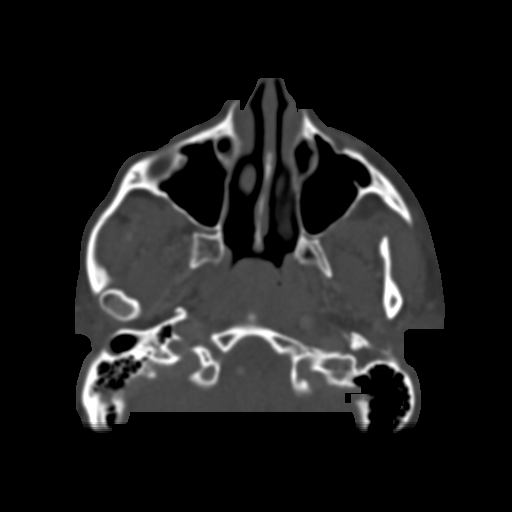

[Series 603: coronal images · coronal · 0.39mm/px · 3 of 99 slices shown]
[im 33/99  bone]
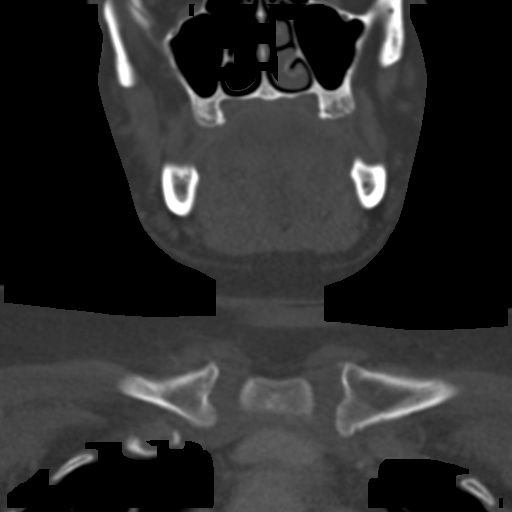
[im 44/99  bone]
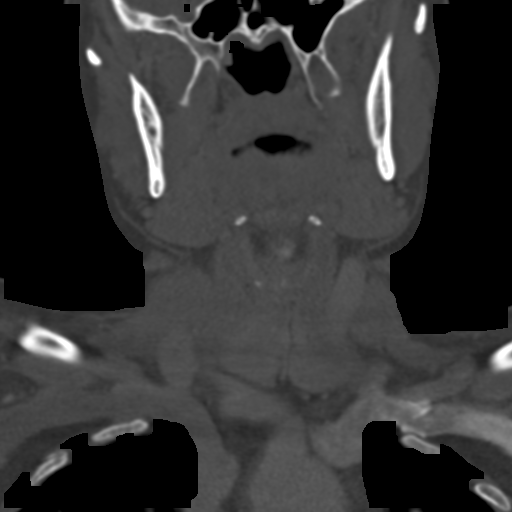
[im 55/99  bone]
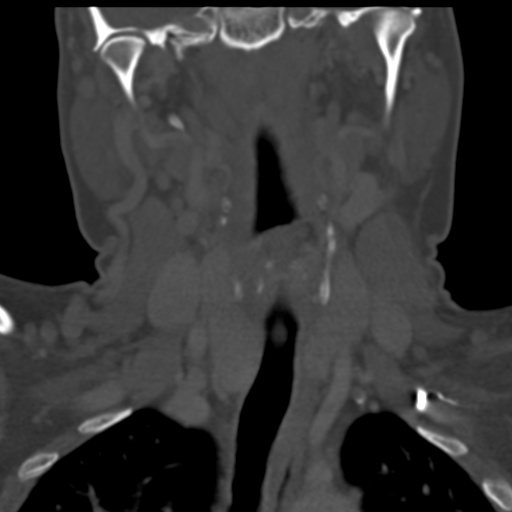

[11 of 20 positions shown; findings below may reference images not displayed]

FINDINGS: .

Suprahyoid neck:  Mild fullness in the region of the lingual and
adenoid tonsils.  No peritonsillar abscess.  No retropharyngeal
fluid collection.  No enhancing mucosal lesion.  No enlargement of
the epiglottis.  Airway patent. Normal salivary glands.

Larynx:  Normal.

Infrahyoid neck:  Normal thyroid.  No neck masses.  No subcutaneous
stranding or cellulitis.

Lymph nodes:  Mildly enlarged and relatively symmetric level II,
III, IV, and V lymph nodes, none greater than 1 cm short axis. No
mediastinal adenopathy.  No necrotic lymph nodes.

Upper chest/mediastinum:  Unremarkable.

Additional:  No significant spondylosis. Craniocervical vasculature
patent.
IMPRESSION: No evidence for postoperative peritonsillar or
retropharyngeal abscess.  Mild symmetric lymphadenopathy.  No
compromise of the airway.

## 2011-01-18 MED ORDER — ONDANSETRON HCL 4 MG PO TABS: 4.0000 mg | ORAL_TABLET | Freq: Four times a day (QID) | ORAL | Status: AC | PRN

## 2011-01-18 MED ORDER — ONDANSETRON HCL 4 MG/2ML IJ SOLN
4.0000 mg | Freq: Once | INTRAMUSCULAR | Status: AC
  Administered 2011-01-18: 4 mg via INTRAVENOUS
  Filled 2011-01-18: qty 2

## 2011-01-18 MED ORDER — OXYCODONE-ACETAMINOPHEN 5-325 MG PO TABS: 1.0000 | ORAL_TABLET | Freq: Four times a day (QID) | ORAL | Status: AC | PRN

## 2011-01-18 MED ORDER — SODIUM CHLORIDE 0.9 % IV BOLUS (SEPSIS)
1000.0000 mL | Freq: Once | INTRAVENOUS | Status: AC
  Administered 2011-01-18: 1000 mL via INTRAVENOUS

## 2011-01-18 MED ORDER — IOHEXOL 300 MG/ML  SOLN
100.0000 mL | Freq: Once | INTRAMUSCULAR | Status: AC | PRN
  Administered 2011-01-18: 100 mL via INTRAVENOUS

## 2011-01-18 MED ORDER — MORPHINE SULFATE 4 MG/ML IJ SOLN
4.0000 mg | Freq: Once | INTRAMUSCULAR | Status: AC
  Administered 2011-01-18: 4 mg via INTRAVENOUS
  Filled 2011-01-18: qty 1

## 2011-01-18 MED ORDER — MORPHINE SULFATE 4 MG/ML IJ SOLN
6.0000 mg | Freq: Once | INTRAMUSCULAR | Status: AC
  Administered 2011-01-18: 6 mg via INTRAVENOUS
  Filled 2011-01-18: qty 2

## 2011-01-18 NOTE — ED Notes (Signed)
Lights out, has call bell, blanket given for comfort.

## 2011-01-18 NOTE — ED Notes (Signed)
Pt c/o sharp pain primarily to LT side of face d/t pain in LT side of throat.  Had tonsillectomy 10/18/2010 but has been having some problems since.  Last 2wks pain is worse-states "it tastes like i have strep throat".  States the pain is making her migraine (has had constant migraine x 1.82yrs) worse.     Also c/o n/v/d since last night.  Vomited x2 last night and has diarrhea x 6 since 0400.

## 2011-01-18 NOTE — ED Notes (Signed)
Pt reports swollen left tr oat and reports minor difficulty swallowing. ABC intact.
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
## 2011-01-18 NOTE — ED Provider Notes (Signed)
History     CSN: [id number]  Arrival date & time 01/18/11  1107   First MD Initiated Contact with Patient 01/18/11 1306      Chief Complaint  Patient presents with  . Sore Throat    s/p tonsilectomy Sept. 20, 2012.  pt states she feels like she has strep throat.  pain > on left than right-states it is sharp pain.   . Nausea    vomited last night x 2  . Diarrhea    x 6 total since 0400 today.    (Consider location/radiation/quality/duration/timing/severity/associated sxs/prior treatment) HPI Comments: Pt presents with sorethroat x 3 mo s/p tonsillectomy, N/V/D x 2 days. 6 diarrhea episodes since last night. Daughter just got over the stomach flu.  In addition patient is complaining of left sided neck pain that goes up to her jaw.  She states it hurts when she opens and closes her mouth and when she moves her head.  ENT: Dr. Annalee Genta tonsillectomy 10/18/2010. Has not seen in 2-3 mo PCP: Dr. Kathlee Nations, [facility], seen last mo for same mouth, throat face pain   Patient is a 40 y.o. female presenting with pharyngitis and diarrhea. The history is provided by the patient.  Sore Throat This is a new problem. Associated symptoms include nausea, neck pain, a sore throat and vomiting. Pertinent negatives include no abdominal pain, anorexia, chest pain, chills, congestion, coughing, fever, headaches, numbness or weakness. The symptoms are aggravated by twisting.  Diarrhea The primary symptoms include nausea, vomiting and diarrhea. Primary symptoms do not include fever, abdominal pain or dysuria. The illness began yesterday.  The vomiting began yesterday. Vomiting occurred once. The emesis contains stomach contents.  The illness does not include chills, anorexia, bloating or constipation.    Past Medical History  Diagnosis Date  . Depression with anxiety   . Hypertension   . Anorexia   . IBS (irritable bowel syndrome)   . Low back pain   . Edema leg   . Hemorrhoids   . Panic  attacks   . Anemia     Iron Def  . Colon polyp 2009  . Migraines   . Colon polyps   . Neuromuscular disorder     fibromyalgia  . Tonsil pain   . Polyp of vocal cord or larynx   . Hidradenitis suppurativa     Past Surgical History  Procedure Date  . Hemorrhoid surgery     with Hidradenitis surgery   . Upper gastrointestinal endoscopy   . Axillary hidradenitis excision   . Inguinal hidradenitis excision   . Tonsillectomy 10/18/2010    by Dr. Annalee Genta    Family History  Problem Relation Age of Onset  . Colon cancer Neg Hx   . Diabetes Mother   . Heart disease Mother     valve leak  . Cancer Father     prostate  . Heart disease Father     History  Substance Use Topics  . Smoking status: Current Some Day Smoker -- 0.2 packs/day    Types: Cigarettes  . Smokeless tobacco: Never Used   Comment: 10 cigarette a day  . Alcohol Use: No    OB History    Grav Para Term Preterm Abortions TAB SAB Ect Mult Living                  Review of Systems  Constitutional: Negative for fever, chills and appetite change.  HENT: Positive for sore throat and neck pain. Negative for  ear pain, congestion, facial swelling, trouble swallowing, neck stiffness, dental problem and voice change.   Eyes: Negative for visual disturbance.  Respiratory: Negative for cough and shortness of breath.   Cardiovascular: Negative for chest pain and leg swelling.  Gastrointestinal: Positive for nausea, vomiting and diarrhea. Negative for abdominal pain, constipation, blood in stool, bloating and anorexia.  Genitourinary: Negative for dysuria, urgency and frequency.  Neurological: Negative for dizziness, syncope, weakness, light-headedness, numbness and headaches.  Psychiatric/Behavioral: Negative for confusion.    Allergies  Review of patient's allergies indicates no known allergies.  Home Medications   Current Outpatient Rx  Name Route Sig Dispense Refill  . ACETAMINOPHEN 500 MG PO TABS Oral  Take 1 tablet (500 mg total) by mouth every 6 (six) hours. Every 6 hours x 2 weeks, then use only as needed (DO NOT EXCEED 4 grams/ day), then change to as needed. 80 tablet 0  . BUPROPION HCL ER (XL) 150 MG PO TB24  TAKE 1 TABLET BY MOUTH EVERY DAY 30 tablet 1  . CLORAZEPATE DIPOTASSIUM 15 MG PO TABS Oral Take 15 mg by mouth 2 (two) times daily.      [name] GABAPENTIN 800 MG PO TABS Oral Take 800 mg by mouth 2 (two) times daily.     [name] HYDROCODONE-ACETAMINOPHEN 5-500 MG PO TABS Oral Take 2 tablets by mouth every 6 (six) hours as needed for pain. 20 tablet 0  . IBUPROFEN 200 MG PO TABS Oral Take 200 mg by mouth every 6 (six) hours as needed. PAIN     . OMEPRAZOLE 40 MG PO CPDR Oral Take 40 mg by mouth daily.      [name] PROMETHAZINE HCL 12.5 MG PO TABS  TAKE 1 TABLET (12.5 MG TOTAL) BY MOUTH EVERY 6 (SIX) HOURS AS NEEDED FOR NAUSEA. 30 tablet 0  . DICYCLOMINE HCL 20 MG PO TABS Oral Take 1 tablet (20 mg total) by mouth 4 (four) times daily. 90 tablet 2    BP 122/77  Pulse 87  Temp(Src) 99.5 F (37.5 C) (Oral)  Resp 22  Ht 6' (1.829 m)  Wt 220 lb (99.791 kg)  BMI 29.84 kg/m2  SpO2 100%  Physical Exam  Nursing note and vitals reviewed. Constitutional: She is oriented to person, place, and time. She appears well-developed and well-nourished. No distress.  HENT:  Head: Normocephalic and atraumatic. No trismus in the jaw.  Right Ear: Hearing, tympanic membrane, external ear and ear canal normal.  Left Ear: Hearing, tympanic membrane, external ear and ear canal normal.  Mouth/Throat: Uvula is midline and oropharynx is clear and moist. Abnormal dentition (multiple dental extractions ). No dental abscesses or uvula swelling. No oropharyngeal exudate.  Eyes: Conjunctivae and EOM are normal. No scleral icterus.  Neck: Normal range of motion. Neck supple. No tracheal deviation present. No thyromegaly present.  Cardiovascular: Normal rate, regular rhythm, normal heart sounds and intact distal pulses.     Pulmonary/Chest: Effort normal and breath sounds normal. No stridor.  Abdominal: Soft. Bowel sounds are normal.  Musculoskeletal: Normal range of motion. She exhibits no edema and no tenderness.  Neurological: She is alert and oriented to person, place, and time. She has normal reflexes. Coordination normal.  Skin: Skin is warm and dry. No rash noted. She is not diaphoretic. No erythema. No pallor.  Psychiatric: She has a normal mood and affect. Her behavior is normal.    ED Course  Procedures (including critical care time)  Labs Reviewed - No data to display No results  found.   No diagnosis found.  3:43 PM  Received call from radiologist who explained that there is no acute findings on CT of neck.  I discussed results with patient and explained that I recommended her followup with her ENT doctor that performed the surgery.  Patient became tearful and explained that she's been trying to get into his office for a week now and is not know what she is going to do about her pain.  I'll be discharging the patient with painkillers and strict followup instructions.  As for her nausea vomiting and diarrhea she'll be given Zofran to help with the nausea.  This patient was discussed and seen with Dr. Rosalia Hammers who agrees with my plan to discharge.  Based on the results of no acute findings on CT, no dental abscesses or gum abnormalities, no trismus, and that the patient is hemodynamically stable, afebrile, and has no abnormal labs including an elevated white blood count.  MDM  N/V/D Left side throat/neck pain          Juda, [place] 01/18/11 [phone]

## 2011-01-19 NOTE — ED Provider Notes (Signed)
History/physical exam/procedure(s) were performed by non-physician practitioner and as supervising physician I was immediately available for consultation/collaboration. I have reviewed all notes and am in agreement with care and plan.   Hilario Quarry, MD 01/19/11 1017

## 2011-01-24 ENCOUNTER — Ambulatory Visit (INDEPENDENT_AMBULATORY_CARE_PROVIDER_SITE_OTHER): Payer: Medicare Other | Admitting: Marriage and Family Therapist

## 2011-01-24 DIAGNOSIS — F331 Major depressive disorder, recurrent, moderate: Secondary | ICD-10-CM

## 2011-01-24 DIAGNOSIS — F410 Panic disorder [episodic paroxysmal anxiety] without agoraphobia: Secondary | ICD-10-CM
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
## 2011-01-31 ENCOUNTER — Ambulatory Visit (INDEPENDENT_AMBULATORY_CARE_PROVIDER_SITE_OTHER): Payer: Medicare Other | Admitting: Marriage and Family Therapist

## 2011-01-31 DIAGNOSIS — F4001 Agoraphobia with panic disorder: Secondary | ICD-10-CM

## 2011-01-31 DIAGNOSIS — F339 Major depressive disorder, recurrent, unspecified: Secondary | ICD-10-CM
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
## 2011-02-04 ENCOUNTER — Other Ambulatory Visit (HOSPITAL_COMMUNITY): Payer: Self-pay | Admitting: Physician Assistant

## 2011-02-04 DIAGNOSIS — F410 Panic disorder [episodic paroxysmal anxiety] without agoraphobia: Secondary | ICD-10-CM

## 2011-02-06 ENCOUNTER — Ambulatory Visit (HOSPITAL_COMMUNITY): Payer: Medicare Other | Admitting: Physician Assistant

## 2011-02-11 ENCOUNTER — Other Ambulatory Visit: Payer: Self-pay | Admitting: Internal Medicine

## 2011-02-12 DIAGNOSIS — M26609 Unspecified temporomandibular joint disorder, unspecified side: Secondary | ICD-10-CM | POA: Diagnosis not present

## 2011-02-12 DIAGNOSIS — H9209 Otalgia, unspecified ear: Secondary | ICD-10-CM | POA: Diagnosis not present

## 2011-02-14 NOTE — Progress Notes (Signed)
Addended by: Cleophas Dunker on: 02/14/2011 03:26 PM   Modules accepted: Level of Service

## 2011-02-19 ENCOUNTER — Ambulatory Visit (INDEPENDENT_AMBULATORY_CARE_PROVIDER_SITE_OTHER): Payer: Medicare Other | Admitting: Marriage and Family Therapist

## 2011-02-19 DIAGNOSIS — F419 Anxiety disorder, unspecified: Secondary | ICD-10-CM

## 2011-02-19 DIAGNOSIS — F339 Major depressive disorder, recurrent, unspecified: Secondary | ICD-10-CM

## 2011-02-19 DIAGNOSIS — F411 Generalized anxiety disorder: Secondary | ICD-10-CM
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
## 2011-02-26 ENCOUNTER — Ambulatory Visit (INDEPENDENT_AMBULATORY_CARE_PROVIDER_SITE_OTHER): Payer: Medicare Other | Admitting: Marriage and Family Therapist

## 2011-02-26 ENCOUNTER — Telehealth: Payer: Self-pay | Admitting: *Deleted

## 2011-02-26 DIAGNOSIS — F410 Panic disorder [episodic paroxysmal anxiety] without agoraphobia: Secondary | ICD-10-CM

## 2011-02-26 DIAGNOSIS — F331 Major depressive disorder, recurrent, moderate: Secondary | ICD-10-CM

## 2011-02-26 NOTE — Telephone Encounter (Signed)
I think the eval on Friday is OK for her sxs.

## 2011-02-26 NOTE — Telephone Encounter (Signed)
Pt called to let us know she had her tonsils extracted in September by ENT, Dr Annalee Genta. Two weeks ago she was having problems with opening her mouth so she went back to him and was sent to DDS.   The DDS told her it was nerve pain and sent her to a surgeon who sent her back to ENT.  She feels she is getting the "run around"  Today she is also c/o right side pain with radiation to back. States pain is constant and has been like this for one month!   She was seen for this while in the ED and told to f/u with PCP.  Pt given appointment in clinic on this Friday 2/1 for evaluation of problems. Please advise.

## 2011-03-01 ENCOUNTER — Encounter: Payer: Medicare Other | Admitting: Internal Medicine

## 2011-03-04 ENCOUNTER — Other Ambulatory Visit (HOSPITAL_COMMUNITY): Payer: Self-pay | Admitting: *Deleted

## 2011-03-04 DIAGNOSIS — F329 Major depressive disorder, single episode, unspecified: Secondary | ICD-10-CM

## 2011-03-04 DIAGNOSIS — F410 Panic disorder [episodic paroxysmal anxiety] without agoraphobia: Secondary | ICD-10-CM

## 2011-03-04 DIAGNOSIS — F3289 Other specified depressive episodes: Secondary | ICD-10-CM

## 2011-03-04 MED ORDER — CLORAZEPATE DIPOTASSIUM 15 MG PO TABS: ORAL_TABLET | ORAL | Status: DC

## 2011-03-07 ENCOUNTER — Other Ambulatory Visit (HOSPITAL_COMMUNITY): Payer: Self-pay | Admitting: Physician Assistant

## 2011-03-07 ENCOUNTER — Other Ambulatory Visit: Payer: Self-pay | Admitting: Internal Medicine

## 2011-03-07 ENCOUNTER — Ambulatory Visit (HOSPITAL_COMMUNITY): Payer: Medicare Other | Admitting: Marriage and Family Therapist

## 2011-03-07 DIAGNOSIS — F332 Major depressive disorder, recurrent severe without psychotic features: Secondary | ICD-10-CM

## 2011-03-07 NOTE — Progress Notes (Unsigned)
   THERAPIST PROGRESS NOTE  Session Time:  1:00 - 2:00 a.m.  Participation Level: Active  Behavioral Response: NeatAlertAnxious  Type of Therapy: Individual Therapy  Treatment Goals addressed: Coping  Interventions: Strength-based, Assertiveness Training, Supportive and Family Systems  Summary: Lindsay [name] is a 41 y.o. female who presents with anxiety and depression.  She He was referred by [name], PA.   Suicidal/Homicidal: Nowithout intent/plan  Therapist Response: ***  Plan: Return again in 1 weeks.  Diagnosis: Axis I: ***    Axis II: {psych axis 2:31910}    [name], LMFT 03/07/2011

## 2011-03-11 ENCOUNTER — Ambulatory Visit (INDEPENDENT_AMBULATORY_CARE_PROVIDER_SITE_OTHER): Payer: Medicare Other | Admitting: Physician Assistant

## 2011-03-11 DIAGNOSIS — F410 Panic disorder [episodic paroxysmal anxiety] without agoraphobia: Secondary | ICD-10-CM | POA: Diagnosis not present

## 2011-03-11 DIAGNOSIS — F329 Major depressive disorder, single episode, unspecified: Secondary | ICD-10-CM

## 2011-03-11 NOTE — Progress Notes (Signed)
   [facility] Follow-up Outpatient Visit  Lindsay [name] [birth year]  Date: 03/11/11   Subjective: Jericho presents today to followup on her medications for anxiety, agitation, and depression. She expresses frustration around trying to get her daughter and 2 middle college. She endorses anxiety and a feeling of being overwhelmed by that process. She also reports that she has cut back significantly on the amount of marijuana she smokes. She had been smoking multiple times daily, and now smokes approximately twice weekly. She is attending church on a daily basis. She denies any SI/HI or AVH. She reports multiple medical problems for which she needs to see her primary care doctor, and a gynecologist. She has multiple bald spots on her head, and reports she feels like she is giving birth to a large baby. She also expresses some frustration with not being able to see her therapist as often as she would like.  There were no vitals filed for this visit.  Mental Status Examination  Appearance: Casual Alert: Yes Attention: good  Cooperative: Yes Eye Contact: Good Speech: Clear and even Psychomotor Activity: Normal Memory/Concentration: Intact Oriented: person, place, time/date and situation Mood: Euthymic and Irritable Affect: Congruent Thought Processes and Associations: Circumstantial and Logical Fund of Knowledge: Fair Thought Content:  Insight: Fair Judgement: Fair  Diagnosis: Panic disorder, depressive disorder not otherwise specified  Treatment Plan: We will continue her Wellbutrin X. are at 150 mg daily, Neurontin 400 mg 3 times a day, and clorazepate 15 mg: 2 each morning, 2 at noon, and one at bedtime. She will return in 2 months for followup  Juanito Gonyer, PA

## 2011-03-14 ENCOUNTER — Ambulatory Visit (HOSPITAL_COMMUNITY): Payer: Medicare Other | Admitting: Marriage and Family Therapist

## 2011-03-26 ENCOUNTER — Ambulatory Visit (INDEPENDENT_AMBULATORY_CARE_PROVIDER_SITE_OTHER): Payer: Medicare Other | Admitting: Marriage and Family Therapist

## 2011-03-26 DIAGNOSIS — F331 Major depressive disorder, recurrent, moderate: Secondary | ICD-10-CM

## 2011-03-26 DIAGNOSIS — F410 Panic disorder [episodic paroxysmal anxiety] without agoraphobia: Secondary | ICD-10-CM
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
## 2011-04-01 ENCOUNTER — Encounter: Payer: Medicare Other | Admitting: Internal Medicine

## 2011-04-02 ENCOUNTER — Ambulatory Visit (INDEPENDENT_AMBULATORY_CARE_PROVIDER_SITE_OTHER): Payer: Medicare Other | Admitting: Marriage and Family Therapist

## 2011-04-02 DIAGNOSIS — F331 Major depressive disorder, recurrent, moderate: Secondary | ICD-10-CM

## 2011-04-02 DIAGNOSIS — F410 Panic disorder [episodic paroxysmal anxiety] without agoraphobia: Secondary | ICD-10-CM
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
## 2011-04-04 ENCOUNTER — Other Ambulatory Visit: Payer: Self-pay | Admitting: Internal Medicine

## 2011-04-04 ENCOUNTER — Ambulatory Visit (HOSPITAL_COMMUNITY): Payer: Medicare Other | Admitting: Marriage and Family Therapist

## 2011-04-05 ENCOUNTER — Ambulatory Visit (INDEPENDENT_AMBULATORY_CARE_PROVIDER_SITE_OTHER): Payer: Medicare Other | Admitting: Internal Medicine

## 2011-04-05 ENCOUNTER — Encounter: Payer: Self-pay | Admitting: Internal Medicine

## 2011-04-05 ENCOUNTER — Other Ambulatory Visit: Payer: Self-pay | Admitting: Internal Medicine

## 2011-04-05 ENCOUNTER — Other Ambulatory Visit (HOSPITAL_COMMUNITY)
Admission: RE | Admit: 2011-04-05 | Discharge: 2011-04-05 | Disposition: A | Discharge status: Home / Self Care | Payer: Medicare Other | Source: Ambulatory Visit | Attending: Internal Medicine | Admitting: Internal Medicine

## 2011-04-05 DIAGNOSIS — Z113 Encounter for screening for infections with a predominantly sexual mode of transmission: Secondary | ICD-10-CM | POA: Diagnosis not present

## 2011-04-05 DIAGNOSIS — G8929 Other chronic pain: Secondary | ICD-10-CM

## 2011-04-05 DIAGNOSIS — F410 Panic disorder [episodic paroxysmal anxiety] without agoraphobia: Secondary | ICD-10-CM

## 2011-04-05 DIAGNOSIS — F332 Major depressive disorder, recurrent severe without psychotic features: Secondary | ICD-10-CM

## 2011-04-05 DIAGNOSIS — K219 Gastro-esophageal reflux disease without esophagitis: Secondary | ICD-10-CM

## 2011-04-05 DIAGNOSIS — F329 Major depressive disorder, single episode, unspecified: Secondary | ICD-10-CM

## 2011-04-05 DIAGNOSIS — R109 Unspecified abdominal pain: Secondary | ICD-10-CM | POA: Diagnosis not present

## 2011-04-05 DIAGNOSIS — F3289 Other specified depressive episodes: Secondary | ICD-10-CM

## 2011-04-05 LAB — CBC
HCT: 41.4 % (ref 36.0–46.0)
Hemoglobin: 13.3 g/dL (ref 12.0–15.0)
MCH: 27.3 pg (ref 26.0–34.0)
MCHC: 32.1 g/dL (ref 30.0–36.0)
RBC: 4.87 MIL/uL (ref 3.87–5.11)

## 2011-04-05 LAB — BASIC METABOLIC PANEL
BUN: 6 mg/dL (ref 6–23)
CO2: 27 mEq/L (ref 19–32)
Glucose, Bld: 51 mg/dL — ABNORMAL LOW (ref 70–99)
Potassium: 4.4 mEq/L (ref 3.5–5.3)

## 2011-04-05 MED ORDER — DICYCLOMINE HCL 20 MG PO TABS: 20.0000 mg | ORAL_TABLET | Freq: Four times a day (QID) | ORAL | Status: DC

## 2011-04-05 MED ORDER — CLORAZEPATE DIPOTASSIUM 15 MG PO TABS: ORAL_TABLET | ORAL | Status: DC

## 2011-04-05 MED ORDER — OMEPRAZOLE 20 MG PO CPDR: 40.0000 mg | DELAYED_RELEASE_CAPSULE | Freq: Every day | ORAL | Status: DC

## 2011-04-05 MED ORDER — GABAPENTIN 800 MG PO TABS: 800.0000 mg | ORAL_TABLET | Freq: Two times a day (BID) | ORAL | Status: DC

## 2011-04-05 MED ORDER — PROMETHAZINE HCL 12.5 MG PO TABS: 12.5000 mg | ORAL_TABLET | Freq: Four times a day (QID) | ORAL | Status: DC | PRN

## 2011-04-05 MED ORDER — OMEPRAZOLE 40 MG PO CPDR: 40.0000 mg | DELAYED_RELEASE_CAPSULE | Freq: Every day | ORAL | Status: DC

## 2011-04-05 NOTE — Progress Notes (Signed)
Addended by: Bufford Spikes on: 04/05/2011 02:15 PM   Modules accepted: Orders

## 2011-04-05 NOTE — Assessment & Plan Note (Signed)
This is been worked up in the past by Dr. Christella Hartigan with negative findings, denies any discharge, dysuria or change in bowel habits. today will check CBC, basic metabolic panel, urinalysis and urine GC and Chlamydia to rule out any obvious causes that may worsen her abdominal pain. We'll refer the patient to GYN for further workup and evaluate if her IUDs the cause of her abdominal pain.

## 2011-04-05 NOTE — Telephone Encounter (Addendum)
  INTERNAL MEDICINE RESIDENCY PROGRAM After-Hours Telephone Call    Reason for call:   I received a call from [name] indicating that her chronic right sided abdominal pain is persistent - was seen in clinic today, and was supposed to get a refill of vicodin, but it was not called in. Also, she had issues with her diflucan that was not sent in, as well as omeprazole at 40mg  instead of the 20mg  x2 to allow for insurance coverage. Does confirm vaginal discharge, for which she was prescribed diflucan. She confirms chills, night sweats, but no overt fevers, urinary complaints.   Pt is frustrated about her continued pain, and threatens to call [facility] Fordyce because of the issues with her medications and continued pain.  I reviewed clinic note from today, and there is no mention of the Vicodin. It is clinic policy NOT to prescribe narcotic medications during after-hours and over the phone unless clear documentation specifies     Historical data:   04/05/2011 - Last clinic appointment with PCP, Dr. Gilford Rile for evaluation of chronic abdominal pain that was subjectively worsening. She otherwise denied dysuria, fevers, chills, bowel changes, nausea/ vomiting diarrhea. He prescribed diflucan, and checked bmet, UA, GC/ chlamydia, which are all pending at this time.  Pt has been evaluated by Dr. Christella Hartigan (GI) for this chronic abd pain in the past, extensively  Last EGD (05/2010) showing moderate gastritis, H.pylori negative.   Last CT abd/ pelvis (08/2010) - normal appendix, IUD device in place, no acute intraabdominal pathology, prominent stool.   Last Colonoscopy (07/2007) - single 8mm colon polyp that appears hyperplastic, (+) hemorrhoids  Last abdominal ultrasound (06/2010) - No gallstones, no gallbladder wall thickening, or pericholecystic fluid. Liver is within normal limits in parenchymal echogenicity. Negative abd [name].    Plan:   Patient informed that per clinic policy, I cannot prescribe  narcotics over the phone after hours, if there is not clear documentation of their need in last clinic note.  I offered to prescribe tramadol, extra strength tylenol, or ibuprofen - pt refused, stating she cannot tolerate tramadol, and tylenol/ NSAIDs do not work for her pain.  I tried to empathize with the pt, and informed that I am sorry that she is in pain, however, there is limited capability on my part to address her needs over the phone if the offered medications are refused.  Therefore, pt plans to come to the ER for evaluation and pain control, likely this evening.  I did fill her diflucan and omeprazole and sent to [facility] pharmacy.    Johnette Abraham, D.O.  04/05/2011, 9:28 PM

## 2011-04-05 NOTE — Progress Notes (Signed)
Subjective:     Patient ID: Lindsay [name], female   DOB: 05/03/1970, 41 y.o.   MRN: [id number]  HPI Pt is a 41 y.o. female who has a past medical history of Depression with anxiety, colon polyps, chronic abdominal pain which had been fully worked up in the past by Dr. Christella Hartigan, findings were negative. Today presents to clinic today for the subjective worsening of her abdominal pain denies any vaginal discharge, or dysuria, reports being able to tolerate oral intake . Denies any fever or chills, no change in bowel habit noted. No N/V/D.   Review of Systems  All other systems reviewed and are negative.       Objective:   Physical Exam  Vitals reviewed. Constitutional: She is oriented to person, place, and time. She appears well-developed and well-nourished.  HENT:  Head: Normocephalic and atraumatic.  Eyes: Pupils are equal, round, and reactive to light.  Neck: Normal range of motion. Neck supple. No JVD present. No thyromegaly present.  Cardiovascular: Normal rate, regular rhythm and normal heart sounds.   No murmur heard. Pulmonary/Chest: Effort normal and breath sounds normal. She has no wheezes. She has no rales.  Abdominal: Soft. Bowel sounds are normal. There is tenderness.  Musculoskeletal: Normal range of motion. She exhibits no edema.  Neurological: She is alert and oriented to person, place, and time.  Skin: Skin is warm and dry.

## 2011-04-05 NOTE — Patient Instructions (Signed)
Please followup with gynecology for her abdominal pain.

## 2011-04-06 LAB — URINALYSIS, ROUTINE W REFLEX MICROSCOPIC
Hgb urine dipstick: NEGATIVE
Leukocytes, UA: NEGATIVE
Nitrite: NEGATIVE
Protein, ur: NEGATIVE mg/dL

## 2011-04-08 ENCOUNTER — Other Ambulatory Visit: Payer: Self-pay | Admitting: *Deleted

## 2011-04-08 DIAGNOSIS — R51 Headache: Secondary | ICD-10-CM

## 2011-04-08 MED ORDER — ESOMEPRAZOLE MAGNESIUM 40 MG PO CPDR: 40.0000 mg | DELAYED_RELEASE_CAPSULE | Freq: Every day | ORAL | Status: DC

## 2011-04-08 MED ORDER — HYDROCODONE-ACETAMINOPHEN 5-500 MG PO TABS: 2.0000 | ORAL_TABLET | Freq: Four times a day (QID) | ORAL | Status: DC | PRN

## 2011-04-08 NOTE — Telephone Encounter (Signed)
Vicodin 5/500mg  called to [facility].

## 2011-04-08 NOTE — Telephone Encounter (Signed)
Fax from [facility] - Omeprazole 40mg  is not covered by pt's insurance Part D. Dexilant 60, Nexium 40, and Omeprazole 20 are covered; need new rx .  Thanks

## 2011-04-08 NOTE — Telephone Encounter (Signed)
Also per [facility] - Omeprazole 40mg  is not coverd by Part D.  Dexilant 60, Nexium 40, and Omeprazole 20 are covered; need new x.  Thanks

## 2011-04-08 NOTE — Telephone Encounter (Signed)
Script for nexium placed.

## 2011-04-11 ENCOUNTER — Ambulatory Visit (INDEPENDENT_AMBULATORY_CARE_PROVIDER_SITE_OTHER): Payer: Medicare Other | Admitting: Marriage and Family Therapist

## 2011-04-11 DIAGNOSIS — F331 Major depressive disorder, recurrent, moderate: Secondary | ICD-10-CM

## 2011-04-11 DIAGNOSIS — F410 Panic disorder [episodic paroxysmal anxiety] without agoraphobia: Secondary | ICD-10-CM
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
## 2011-04-16 ENCOUNTER — Telehealth (HOSPITAL_COMMUNITY): Payer: Self-pay | Admitting: *Deleted

## 2011-04-16 DIAGNOSIS — F332 Major depressive disorder, recurrent severe without psychotic features: Secondary | ICD-10-CM

## 2011-04-16 NOTE — Telephone Encounter (Signed)
Asks for refill on "all" medications. Next appt 4/17. Also, would like to know if Wellbutrin can be increased.

## 2011-04-18 ENCOUNTER — Ambulatory Visit (HOSPITAL_COMMUNITY): Payer: Self-pay | Admitting: Marriage and Family Therapist

## 2011-04-25 ENCOUNTER — Ambulatory Visit (HOSPITAL_COMMUNITY): Payer: Self-pay | Admitting: Marriage and Family Therapist

## 2011-04-25 ENCOUNTER — Other Ambulatory Visit (HOSPITAL_COMMUNITY): Payer: Self-pay | Admitting: *Deleted

## 2011-04-25 DIAGNOSIS — F332 Major depressive disorder, recurrent severe without psychotic features: Secondary | ICD-10-CM

## 2011-04-25 DIAGNOSIS — F329 Major depressive disorder, single episode, unspecified: Secondary | ICD-10-CM

## 2011-04-25 DIAGNOSIS — F410 Panic disorder [episodic paroxysmal anxiety] without agoraphobia: Secondary | ICD-10-CM

## 2011-04-25 MED ORDER — BUPROPION HCL ER (XL) 150 MG PO TB24: 150.0000 mg | ORAL_TABLET | ORAL | Status: DC

## 2011-04-25 MED ORDER — GABAPENTIN 400 MG PO CAPS: 800.0000 mg | ORAL_CAPSULE | Freq: Three times a day (TID) | ORAL | Status: DC

## 2011-04-25 MED ORDER — CLORAZEPATE DIPOTASSIUM 15 MG PO TABS: ORAL_TABLET | ORAL | Status: DC

## 2011-04-29 ENCOUNTER — Other Ambulatory Visit: Payer: Self-pay | Admitting: Internal Medicine

## 2011-04-29 DIAGNOSIS — R110 Nausea: Secondary | ICD-10-CM

## 2011-05-01 NOTE — Progress Notes (Signed)
   THERAPIST PROGRESS NOTE  Session Time:  10:00 - 11:00 a.m.  Participation Level: Active  Behavioral Response: CasualAlertAnxious  Type of Therapy: Individual Therapy  Treatment Goals addressed: Coping  Interventions: Strength-based, Assertiveness Training, Supportive and Family Systems  Summary: Lindsay [name] is a 41 y.o. female who presents with anxiety and depression.  She was referred by [name], PA.  Patient continues to focus more on the care of her daughter.  Talked about several instances of trying to get her through school both behaviorally and academically.  She reports she has neglected to get to church and also she continues to not feel well and having pain resting at home.   Suicidal/Homicidal: Nowithout intent/plan  Therapist Response:  When asked about whether or not she contacted the [facility] [place] regarding another gynecologist patient stated she forgot although when it came to her daughter's care she was focused immediately on her daughter's needs.  Confronted her on this.  The plan in next session is to give patient the [facility] number in session and have her set up the appointment in session.  Plan: Return again in 1 weeks.  Diagnosis: Axis I: Major depressive d/o, moderate; anxiety d/o, panic type    Axis II: Deferred    Reba Hulett, LMFT 05/01/2011

## 2011-05-02 ENCOUNTER — Ambulatory Visit (INDEPENDENT_AMBULATORY_CARE_PROVIDER_SITE_OTHER): Payer: Medicare Other | Admitting: Marriage and Family Therapist

## 2011-05-02 DIAGNOSIS — F410 Panic disorder [episodic paroxysmal anxiety] without agoraphobia: Secondary | ICD-10-CM

## 2011-05-02 DIAGNOSIS — F331 Major depressive disorder, recurrent, moderate: Secondary | ICD-10-CM

## 2011-05-09 ENCOUNTER — Ambulatory Visit (INDEPENDENT_AMBULATORY_CARE_PROVIDER_SITE_OTHER): Payer: Medicare Other | Admitting: Marriage and Family Therapist

## 2011-05-09 DIAGNOSIS — F331 Major depressive disorder, recurrent, moderate: Secondary | ICD-10-CM

## 2011-05-09 DIAGNOSIS — F410 Panic disorder [episodic paroxysmal anxiety] without agoraphobia: Secondary | ICD-10-CM
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
## 2011-05-15 ENCOUNTER — Ambulatory Visit (INDEPENDENT_AMBULATORY_CARE_PROVIDER_SITE_OTHER): Payer: Medicare Other | Admitting: Physician Assistant

## 2011-05-15 DIAGNOSIS — F410 Panic disorder [episodic paroxysmal anxiety] without agoraphobia: Secondary | ICD-10-CM

## 2011-05-15 DIAGNOSIS — F329 Major depressive disorder, single episode, unspecified: Secondary | ICD-10-CM | POA: Diagnosis not present

## 2011-05-15 MED ORDER — DULOXETINE HCL 30 MG PO CPEP: 30.0000 mg | ORAL_CAPSULE | Freq: Every day | ORAL | Status: DC

## 2011-05-16 ENCOUNTER — Ambulatory Visit (INDEPENDENT_AMBULATORY_CARE_PROVIDER_SITE_OTHER): Payer: Medicare Other | Admitting: Marriage and Family Therapist

## 2011-05-16 DIAGNOSIS — F410 Panic disorder [episodic paroxysmal anxiety] without agoraphobia: Secondary | ICD-10-CM

## 2011-05-16 DIAGNOSIS — F331 Major depressive disorder, recurrent, moderate: Secondary | ICD-10-CM

## 2011-05-16 NOTE — Progress Notes (Signed)
   [facility] Follow-up Outpatient Visit  Lindsay [name] 04-26-1970  Date: 05/15/2011  Subjective: Lindsay [name] presents today to followup on medications prescribed for depression and anxiety. She complains of persistent right flank pain for which she has been seen by a gastroenterologist who referred her for an abdominal ultrasound. She has been unable to get that ultrasound because of other health concerns. She reports that she has gained a significant amount of weight, and has increased from a size 12 to a size 18 over about 1-1/2 year period. She also reports that she has frequent headaches and diarrhea. She continues to crave and he junk food. She continues to smoke marijuana about 3-5 days per week and is smoking cigarettes at about 1 pack per day. She complains of poor sleep. She endorses depressed mood, and isolating behavior. She denies any suicidal or homicidal ideation. She denies any auditory or visual hallucinations.   There were no vitals filed for this visit.  Mental Status Examination  Appearance: Casual Alert: Yes Attention: good  Cooperative: Yes Eye Contact: Good Speech: Clear and even Psychomotor Activity: Normal Memory/Concentration: Intact Oriented: person, place, time/date and situation Mood: Anxious and Depressed Affect: Constricted Thought Processes and Associations: Logical Fund of Knowledge: Good Thought Content: Normal Insight: Fair Judgement: Fair  Diagnosis: Generalized anxiety disorder, depressive disorder NOS, cannabis abuse  Treatment Plan: We will change her Wellbutrin to Cymbalta, and start at 30 mg daily. She has been instructed to continue to take the Wellbutrin until it runs out, which should be about 2 weeks. Also she is encouraged to try to increase her level of exercise, and we discussed getting a scholarship to go to the [facility]. She will return for followup in 4 weeks.  Jakita Dutkiewicz, PA-C
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
## 2011-05-23 ENCOUNTER — Other Ambulatory Visit: Payer: Self-pay | Admitting: Internal Medicine

## 2011-06-06 ENCOUNTER — Ambulatory Visit (INDEPENDENT_AMBULATORY_CARE_PROVIDER_SITE_OTHER): Payer: Medicare Other | Admitting: Marriage and Family Therapist

## 2011-06-06 DIAGNOSIS — F331 Major depressive disorder, recurrent, moderate: Secondary | ICD-10-CM

## 2011-06-06 DIAGNOSIS — F410 Panic disorder [episodic paroxysmal anxiety] without agoraphobia: Secondary | ICD-10-CM
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
## 2011-06-08 ENCOUNTER — Other Ambulatory Visit: Payer: Self-pay | Admitting: Internal Medicine
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
## 2011-06-13 ENCOUNTER — Ambulatory Visit (INDEPENDENT_AMBULATORY_CARE_PROVIDER_SITE_OTHER): Payer: Medicare Other | Admitting: Marriage and Family Therapist

## 2011-06-13 DIAGNOSIS — F331 Major depressive disorder, recurrent, moderate: Secondary | ICD-10-CM

## 2011-06-13 DIAGNOSIS — F410 Panic disorder [episodic paroxysmal anxiety] without agoraphobia: Secondary | ICD-10-CM

## 2011-06-20 ENCOUNTER — Other Ambulatory Visit (HOSPITAL_COMMUNITY): Payer: Self-pay | Admitting: *Deleted

## 2011-06-20 ENCOUNTER — Ambulatory Visit (INDEPENDENT_AMBULATORY_CARE_PROVIDER_SITE_OTHER): Payer: Medicare Other | Admitting: Marriage and Family Therapist

## 2011-06-20 DIAGNOSIS — F410 Panic disorder [episodic paroxysmal anxiety] without agoraphobia: Secondary | ICD-10-CM

## 2011-06-20 DIAGNOSIS — F329 Major depressive disorder, single episode, unspecified: Secondary | ICD-10-CM

## 2011-06-20 DIAGNOSIS — F331 Major depressive disorder, recurrent, moderate: Secondary | ICD-10-CM

## 2011-06-20 MED ORDER — CLORAZEPATE DIPOTASSIUM 15 MG PO TABS: ORAL_TABLET | ORAL | Status: DC
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
## 2011-06-26 ENCOUNTER — Ambulatory Visit (HOSPITAL_COMMUNITY): Payer: Self-pay | Admitting: Marriage and Family Therapist

## 2011-06-26 NOTE — Progress Notes (Unsigned)
  THERAPIST PROGRESS NOTE  Session Time:  10:00 - 11:00 a.m.  Participation Level:   Behavioral Response:   Type of Therapy: Individual Therapy  Treatment Goals addressed:   Interventions:   Summary: Lindsay [name] is a 41 y.o. female who presents with history of anxiety and depression.  She also has multiple stressors.  She was referred by [name], PA. Patient did not show for her appointment.  Called patient who stated she thought her appointment was tomorrow.  Asked her if she wanted to do a telephone session, patient said "yes," but the telephone was disconnected. Tried to call her back but could only leave a message and patient never called back.  Suicidal/Homicidal:   Therapist Response: ***  Plan: Return again in 1 weeks.  Diagnosis: Axis I: Major depressive d/o, moderate; Panic d/o    Axis II: Deferred    Lindsay Delaughter, LMFT 06/26/2011

## 2011-06-27 ENCOUNTER — Ambulatory Visit (HOSPITAL_COMMUNITY): Payer: Self-pay | Admitting: Physician Assistant

## 2011-06-29 ENCOUNTER — Other Ambulatory Visit: Payer: Self-pay | Admitting: Internal Medicine

## 2011-06-29 ENCOUNTER — Other Ambulatory Visit (HOSPITAL_COMMUNITY): Payer: Self-pay | Admitting: Physician Assistant
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
## 2011-07-04 ENCOUNTER — Encounter (HOSPITAL_COMMUNITY): Payer: Self-pay | Admitting: *Deleted

## 2011-07-04 ENCOUNTER — Ambulatory Visit (INDEPENDENT_AMBULATORY_CARE_PROVIDER_SITE_OTHER): Payer: Medicare Other | Admitting: Marriage and Family Therapist

## 2011-07-04 DIAGNOSIS — F331 Major depressive disorder, recurrent, moderate: Secondary | ICD-10-CM

## 2011-07-04 DIAGNOSIS — F410 Panic disorder [episodic paroxysmal anxiety] without agoraphobia: Secondary | ICD-10-CM
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
## 2011-07-10 ENCOUNTER — Ambulatory Visit (INDEPENDENT_AMBULATORY_CARE_PROVIDER_SITE_OTHER): Payer: Medicare Other | Admitting: Marriage and Family Therapist

## 2011-07-10 DIAGNOSIS — F410 Panic disorder [episodic paroxysmal anxiety] without agoraphobia: Secondary | ICD-10-CM

## 2011-07-10 DIAGNOSIS — F331 Major depressive disorder, recurrent, moderate: Secondary | ICD-10-CM

## 2011-07-15 ENCOUNTER — Other Ambulatory Visit (HOSPITAL_COMMUNITY): Payer: Self-pay | Admitting: Physician Assistant

## 2011-07-18 ENCOUNTER — Ambulatory Visit (INDEPENDENT_AMBULATORY_CARE_PROVIDER_SITE_OTHER): Payer: Medicare Other | Admitting: Marriage and Family Therapist

## 2011-07-18 DIAGNOSIS — F331 Major depressive disorder, recurrent, moderate: Secondary | ICD-10-CM

## 2011-07-18 DIAGNOSIS — F410 Panic disorder [episodic paroxysmal anxiety] without agoraphobia: Secondary | ICD-10-CM
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
## 2011-07-23 ENCOUNTER — Other Ambulatory Visit: Payer: Self-pay | Admitting: Internal Medicine

## 2011-07-25 ENCOUNTER — Ambulatory Visit (INDEPENDENT_AMBULATORY_CARE_PROVIDER_SITE_OTHER): Payer: Medicare Other | Admitting: Marriage and Family Therapist

## 2011-07-25 DIAGNOSIS — F410 Panic disorder [episodic paroxysmal anxiety] without agoraphobia: Secondary | ICD-10-CM

## 2011-07-25 DIAGNOSIS — F331 Major depressive disorder, recurrent, moderate: Secondary | ICD-10-CM
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
## 2011-08-05 ENCOUNTER — Other Ambulatory Visit: Payer: Self-pay | Admitting: Internal Medicine

## 2011-08-08 ENCOUNTER — Ambulatory Visit (INDEPENDENT_AMBULATORY_CARE_PROVIDER_SITE_OTHER): Payer: Medicare Other | Admitting: Marriage and Family Therapist

## 2011-08-08 DIAGNOSIS — F410 Panic disorder [episodic paroxysmal anxiety] without agoraphobia: Secondary | ICD-10-CM

## 2011-08-08 DIAGNOSIS — F331 Major depressive disorder, recurrent, moderate: Secondary | ICD-10-CM

## 2011-08-08 NOTE — Progress Notes (Signed)
   THERAPIST PROGRESS NOTE  Session Time:  10:00 - 11:00 a.m.  Participation Level: Active  Behavioral Response: CasualAlertAnxious  Type of Therapy: Individual Therapy  Treatment Goals addressed: Coping  Interventions: Strength-based, Assertiveness Training and Supportive  Summary: Lindsay [name] is a 41 y.o. female who presents with depression and anxiety.  She was referred by [name], PA. Patient reports she continues to have problems with her medication Cymbalta.  She reports after taking the medication a few hours later she begins to "sweat profusely."  She states this has only been happening since her increase dosage of the medication.  Patient also reports that although she has been busy with her daughter's summer activities, that [facility] she has not been [facility] her [facility] and made an appointment with her to come in.  Patient has been there twice, once for the introduction and again to work out.  Patient likes this, and talked about going leading to her doing other things for herself, like getting back into church.  Suicidal/Homicidal: Nowithout intent/plan  Therapist Response:  Patient will be having an appointment with [name], PA and suggested she keep a list of symptoms associated with medications for the visit.  Encouraged patient to continue with exercising because it will help her both physically and mentally.  Patient again had to be confronted about focusing on her daughter versus focusing on herself but once she was confronted spent the session talking about [facility].  Discussed patient being courageous and coming out of her comfort zone by going to the [facility].  Plan: Return again in 1 weeks.  Diagnosis: Axis I:  Major depressive d/o, moderate; Panic d/o    Axis II: Deferred    Froylan Hobby, LMFT 08/08/2011

## 2011-08-14 ENCOUNTER — Ambulatory Visit (HOSPITAL_COMMUNITY): Payer: Self-pay | Admitting: Marriage and Family Therapist

## 2011-08-15 ENCOUNTER — Ambulatory Visit (INDEPENDENT_AMBULATORY_CARE_PROVIDER_SITE_OTHER): Payer: Medicare Other | Admitting: Physician Assistant

## 2011-08-15 DIAGNOSIS — F411 Generalized anxiety disorder: Secondary | ICD-10-CM | POA: Diagnosis not present

## 2011-08-15 DIAGNOSIS — F329 Major depressive disorder, single episode, unspecified: Secondary | ICD-10-CM

## 2011-08-15 DIAGNOSIS — F410 Panic disorder [episodic paroxysmal anxiety] without agoraphobia: Secondary | ICD-10-CM

## 2011-08-15 MED ORDER — GABAPENTIN 400 MG PO CAPS: 800.0000 mg | ORAL_CAPSULE | Freq: Three times a day (TID) | ORAL | Status: DC

## 2011-08-15 MED ORDER — CLORAZEPATE DIPOTASSIUM 15 MG PO TABS: ORAL_TABLET | ORAL | Status: DC

## 2011-08-15 MED ORDER — DULOXETINE HCL 60 MG PO CPEP: 60.0000 mg | ORAL_CAPSULE | Freq: Every day | ORAL | Status: DC

## 2011-08-15 NOTE — Progress Notes (Signed)
   [facility] Follow-up Outpatient Visit  Lindsay [name] [birth year]  Date: 08/15/2011   Subjective: Lindsay [name] presents today to followup on her treatment for anxiety and depression. She continues to experience multiple physical problems. She reports that she feels tired a lot, but she only sleeps a total of about 5 hours per day, and that is split between 2 hours in the daytime and 3 hours at night. She also reports that she is experiencing increased perspiration and hot flashes, with changes in her menstrual cycle. She also had reports that her headaches continue to be frequent. She reports that her diarrhea has resolved. She did get a scholarship to the [facility] and is getting a lot of attention and assistance there. She denies any suicidal or homicidal ideation. She denies any auditory or visual hallucinations.  There were no vitals filed for this visit.  Mental Status Examination  Appearance: Casual Alert: Yes Attention: good  Cooperative: Yes Eye Contact: Good Speech: Clear and coherent Psychomotor Activity: Normal Memory/Concentration: Intact Oriented: person, place, time/date and situation Mood: Dysphoric Affect: Congruent Thought Processes and Associations: Logical Fund of Knowledge: Good Thought Content: Normal Insight: Fair Judgement: Good  Diagnosis: Generalized anxiety disorder, depressive disorder NOS  Treatment Plan: We will continue her Cymbalta 60 mg daily, Tranxene 30 mg twice daily and 15 mg at bedtime, and Neurontin 800 mg 3 times daily. She will return for followup in 2 months.  Anginette Espejo, PA-C

## 2011-08-16 ENCOUNTER — Other Ambulatory Visit: Payer: Self-pay | Admitting: Internal Medicine

## 2011-08-22 ENCOUNTER — Ambulatory Visit (INDEPENDENT_AMBULATORY_CARE_PROVIDER_SITE_OTHER): Payer: Medicare Other | Admitting: Marriage and Family Therapist

## 2011-08-22 DIAGNOSIS — F331 Major depressive disorder, recurrent, moderate: Secondary | ICD-10-CM

## 2011-08-22 DIAGNOSIS — F410 Panic disorder [episodic paroxysmal anxiety] without agoraphobia: Secondary | ICD-10-CM

## 2011-08-22 NOTE — Progress Notes (Signed)
   THERAPIST PROGRESS NOTE  Session Time:  10:00 - 11:00 a.m.  Participation Level: Active  Behavioral Response:   CashualAlertAnxious and Depressed  Type of Therapy: Individual Therapy  Treatment Goals addressed: Coping  Interventions: Strength-based, Supportive and Family Systems  Summary: Lindsay [name] is a 41 y.o. female who presents with anxiety and depression.  She was referred by [name], PA.  Patient could not find transportation and had a telephone session.  Patient states she was alone for about four days when her daughter went to the beach with her Big Sister.  she reports taking the time to rest.  She did state going to a picnic in memory of one of her friends who was murdered last September.  She reports the girlfriend of the friend who died has been calling her every day still mourning the loss but not in a healthy way.  Patient was also talking about all of the losses she has experienced and what her living conditions are like.  Patient stated "this is below poverty and no one should live like this."  She reports she is angry over all the losses and how she lives.  She also discussed continuing to not feel well physically and is frustrated that she is not getting the medical help she needs.  Patient did state she was feeling better after talking.  Suicidal/Homicidal: Nowithout intent/plan  Therapist Response:  Discussed patient's self-care in small ways despite what is happening around her.  Also discussed that her friend's girlfriend needs help that patient cannot give her and her need to put limits on helping the friend's GF.  Basically supported patient regarding her life's conditions.    Plan: Return again in 1 weeks.  Diagnosis: Axis I: Major depressive d/o, moderate; Panic d/o    Axis II: Deferred    Ladarrell Cornwall, LMFT 08/22/2011

## 2011-08-28 ENCOUNTER — Ambulatory Visit (INDEPENDENT_AMBULATORY_CARE_PROVIDER_SITE_OTHER): Payer: Medicare Other | Admitting: Marriage and Family Therapist

## 2011-08-28 DIAGNOSIS — F410 Panic disorder [episodic paroxysmal anxiety] without agoraphobia: Secondary | ICD-10-CM

## 2011-08-28 DIAGNOSIS — F331 Major depressive disorder, recurrent, moderate: Secondary | ICD-10-CM
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
## 2011-09-05 ENCOUNTER — Ambulatory Visit (INDEPENDENT_AMBULATORY_CARE_PROVIDER_SITE_OTHER): Payer: Medicare Other | Admitting: Marriage and Family Therapist

## 2011-09-05 DIAGNOSIS — F410 Panic disorder [episodic paroxysmal anxiety] without agoraphobia: Secondary | ICD-10-CM

## 2011-09-05 DIAGNOSIS — F331 Major depressive disorder, recurrent, moderate: Secondary | ICD-10-CM

## 2011-09-05 NOTE — Progress Notes (Signed)
   THERAPIST PROGRESS NOTE  Session Time:  10:00 - 11:00 a.m.  Participation Level: Active  Behavioral Response: CasualAlertAnxious and Depressed  Type of Therapy: Individual Therapy  Treatment Goals addressed: Coping  Interventions: Strength-based, Assertiveness Training and Supportive  Summary: Lindsay [name] is a 41 y.o. female who presents with anxiety and depression.  She was referred by [name], PA.  Patient continues to report not feeling well physically having pain throughout her body and having digestive problems.  Patient states that when she does go to the outpatient clinic or to the emergency room she believes they look at her medication list and find out she is a "mental health patient" and treat her differently.  Patient also states she has been "giving up" on other areas of her life including going to church because she is in pain and not sleeping, leading to her sitting in church and falling asleep.  She says her daughter has to nudge her to wake her up.  Patient also talked about starting a list of goals she wants to achieve in and out of session.  She also reports from the discussion from last session that she needs to not worry about how others are telling her to raise her child (e.g., telling her she needs to be punitive) and focus on her daughter having special needs.  Suicidal/Homicidal: Nowithout intent/plan  Therapist Response:  Discussed with patient how she has not gone to the doctor's office because she believes she is not getting the answers she needs to find out what is happening with her physically.  Specifically discussed how patient can be assertive with doctors and how to proceed in finding a doctor who will consistently see her based on her insurance.  Discussed whether her feelings were coming from her own embarrassment or from how others are treating her.  Also talked about developing another treatment plan and/or to discuss in next session whether patient  needs to add additional goals.  Patient will be making a list of goals and will discuss in next session.  Will continue to discuss patient parenting a special needs child, not a "bad child" in order to help patient view her child differently.  Plan: Return again in 1 weeks.  Diagnosis: Axis I: Major depressive d/o, moderate; Panic d/o    Axis II: Deferred    Detric Scalisi, LMFT 09/05/2011

## 2011-09-08 ENCOUNTER — Other Ambulatory Visit: Payer: Self-pay | Admitting: Internal Medicine

## 2011-09-12 ENCOUNTER — Ambulatory Visit (INDEPENDENT_AMBULATORY_CARE_PROVIDER_SITE_OTHER): Payer: Medicare Other | Admitting: Marriage and Family Therapist

## 2011-09-12 ENCOUNTER — Ambulatory Visit (HOSPITAL_COMMUNITY): Payer: Self-pay | Admitting: Marriage and Family Therapist

## 2011-09-12 ENCOUNTER — Other Ambulatory Visit: Payer: Self-pay | Admitting: Internal Medicine

## 2011-09-12 DIAGNOSIS — F331 Major depressive disorder, recurrent, moderate: Secondary | ICD-10-CM

## 2011-09-12 DIAGNOSIS — F410 Panic disorder [episodic paroxysmal anxiety] without agoraphobia: Secondary | ICD-10-CM

## 2011-09-12 NOTE — Progress Notes (Signed)
   THERAPIST PROGRESS NOTE  Session Time:  10:00 - 11:00 a.m.  Participation Level: Active  Behavioral Response: CasualAlertAnxious and Depressed  Type of Therapy: Individual Therapy  Treatment Goals addressed: Coping  Interventions: Supportive, strength-based, confrontational  Summary: Lindsay [name] is a 41 y.o. female who presents with depression and anxiety.  She He was referred by [name], PA.  Patient spent the session talking about one problem after another from problems with the government to problems in the neighborhood, her house, and her daughter, and that projects she wants to work on with her daughter away.  Right now her daughter is away at camp and patient states the only thing she is doing is sleeping.  She believes it is because of the Cymbalta.  She continues to complain about physical problems and not being able to get the help she needs.    Suicidal/Homicidal: Nowithout intent/plan  Therapist Response:  It was clear that patient was ruminating based on having to be stopped from jumping from one subject to another.  When confronted about not being focused and asking questions about her self care, she would complain about something else that explained why she could not take care of herself.  Patient was unable to focus during the entire session.  Will discuss with [name] patient's behavior during this session.  Plan: Return again in 1 weeks.  Diagnosis: Axis I: major depressive d/o moderate; panic d/o    Axis II: Deferred    Lindsay Schuermann, LMFT 09/12/2011

## 2011-09-12 NOTE — Telephone Encounter (Signed)
Ordered 08/16/2011 by our clinic, but no documentation noted as to reason.  Will speak with patient at next visit.  Likely for relief from chronic abdominal pain.

## 2011-09-19 ENCOUNTER — Ambulatory Visit (INDEPENDENT_AMBULATORY_CARE_PROVIDER_SITE_OTHER): Payer: Medicare Other | Admitting: Marriage and Family Therapist

## 2011-09-19 DIAGNOSIS — F410 Panic disorder [episodic paroxysmal anxiety] without agoraphobia: Secondary | ICD-10-CM

## 2011-09-19 DIAGNOSIS — F331 Major depressive disorder, recurrent, moderate: Secondary | ICD-10-CM
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
## 2011-09-26 ENCOUNTER — Ambulatory Visit (HOSPITAL_COMMUNITY): Payer: Self-pay | Admitting: Marriage and Family Therapist

## 2011-10-03 ENCOUNTER — Ambulatory Visit (INDEPENDENT_AMBULATORY_CARE_PROVIDER_SITE_OTHER): Payer: Medicare Other | Admitting: Marriage and Family Therapist

## 2011-10-03 DIAGNOSIS — F410 Panic disorder [episodic paroxysmal anxiety] without agoraphobia: Secondary | ICD-10-CM

## 2011-10-03 DIAGNOSIS — F331 Major depressive disorder, recurrent, moderate: Secondary | ICD-10-CM
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
## 2011-10-10 ENCOUNTER — Ambulatory Visit (INDEPENDENT_AMBULATORY_CARE_PROVIDER_SITE_OTHER): Payer: Medicare Other | Admitting: Marriage and Family Therapist

## 2011-10-10 DIAGNOSIS — F331 Major depressive disorder, recurrent, moderate: Secondary | ICD-10-CM

## 2011-10-10 DIAGNOSIS — F410 Panic disorder [episodic paroxysmal anxiety] without agoraphobia: Secondary | ICD-10-CM

## 2011-10-10 NOTE — Progress Notes (Signed)
   THERAPIST PROGRESS NOTE  Session Time:  10:00 - 11:00 a.m.  Participation Level: Active  Behavioral Response: CasualAlertAnxious and Depressed  Type of Therapy: Individual Therapy  Treatment Goals addressed: Coping  Interventions: Strength-based, Assertiveness Training, Supportive and Family Systems  Summary: Lindsay [name] is a 41 y.o. female who presents with anxiety and depression.  She was referred by [name], PA.  Patient reports she was unable to get more appointments until October 3.  Discussed patient spending time with her pastor who told her she "needed to let go of things that keep her aware from the [place]."  She reports feeling like she is going to [place] since she has not taken care of herself in the ways she needs to to keep her "closer to the [place]."  She talked about her anger, smoking cigarettes and marijuana, and getting caught up in other people's problems.  Patient talked about maybe part of why she feels sick physically is because of how she is living her life.  Suicidal/Homicidal: Nowithout intent/plan  Therapist Response:  Took the fact that patient cannot get another appointment until October 3 for her to use self-care through her church since this is what patient states is so important to her.  Plan: Return again in 1 weeks.  Diagnosis: Axis I: Major depressive d/o, moderate; Panic d/o    Axis II: Deferred    Adrik Khim, LMFT 10/10/2011

## 2011-10-16 ENCOUNTER — Ambulatory Visit (INDEPENDENT_AMBULATORY_CARE_PROVIDER_SITE_OTHER): Payer: Medicare Other | Admitting: Physician Assistant

## 2011-10-16 DIAGNOSIS — F988 Other specified behavioral and emotional disorders with onset usually occurring in childhood and adolescence: Secondary | ICD-10-CM

## 2011-10-16 DIAGNOSIS — F329 Major depressive disorder, single episode, unspecified: Secondary | ICD-10-CM

## 2011-10-16 DIAGNOSIS — F411 Generalized anxiety disorder: Secondary | ICD-10-CM | POA: Diagnosis not present

## 2011-10-16 DIAGNOSIS — F410 Panic disorder [episodic paroxysmal anxiety] without agoraphobia: Secondary | ICD-10-CM

## 2011-10-16 DIAGNOSIS — F331 Major depressive disorder, recurrent, moderate: Secondary | ICD-10-CM | POA: Diagnosis not present

## 2011-10-16 DIAGNOSIS — F3289 Other specified depressive episodes: Secondary | ICD-10-CM

## 2011-10-16 MED ORDER — GABAPENTIN 400 MG PO CAPS: 800.0000 mg | ORAL_CAPSULE | Freq: Three times a day (TID) | ORAL | Status: DC

## 2011-10-16 MED ORDER — DULOXETINE HCL 60 MG PO CPEP: 60.0000 mg | ORAL_CAPSULE | Freq: Every day | ORAL | Status: DC

## 2011-10-16 MED ORDER — LISDEXAMFETAMINE DIMESYLATE 20 MG PO CAPS: 20.0000 mg | ORAL_CAPSULE | ORAL | Status: DC

## 2011-10-16 MED ORDER — CLORAZEPATE DIPOTASSIUM 15 MG PO TABS: ORAL_TABLET | ORAL | Status: DC
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
## 2011-10-23 ENCOUNTER — Other Ambulatory Visit (HOSPITAL_COMMUNITY): Payer: Self-pay | Admitting: *Deleted

## 2011-10-23 DIAGNOSIS — F988 Other specified behavioral and emotional disorders with onset usually occurring in childhood and adolescence: Secondary | ICD-10-CM

## 2011-10-23 MED ORDER — AMPHETAMINE-DEXTROAMPHET ER 10 MG PO CP24: 10.0000 mg | ORAL_CAPSULE | ORAL | Status: DC

## 2011-10-23 MED ORDER — AMPHETAMINE-DEXTROAMPHET ER 20 MG PO CP24: 20.0000 mg | ORAL_CAPSULE | ORAL | Status: DC

## 2011-10-23 NOTE — Telephone Encounter (Signed)
Correction of original order from provider for Adderall XR 10 mg, instead of 20 mg as printed.

## 2011-10-31 ENCOUNTER — Ambulatory Visit (HOSPITAL_COMMUNITY): Payer: Self-pay | Admitting: Marriage and Family Therapist

## 2011-10-31 ENCOUNTER — Telehealth (HOSPITAL_COMMUNITY): Payer: Self-pay

## 2011-10-31 ENCOUNTER — Ambulatory Visit (INDEPENDENT_AMBULATORY_CARE_PROVIDER_SITE_OTHER): Payer: Medicare Other | Admitting: Marriage and Family Therapist

## 2011-10-31 ENCOUNTER — Other Ambulatory Visit: Payer: Self-pay | Admitting: Internal Medicine

## 2011-10-31 DIAGNOSIS — F410 Panic disorder [episodic paroxysmal anxiety] without agoraphobia: Secondary | ICD-10-CM | POA: Diagnosis not present

## 2011-10-31 DIAGNOSIS — F331 Major depressive disorder, recurrent, moderate: Secondary | ICD-10-CM | POA: Diagnosis not present

## 2011-10-31 NOTE — Telephone Encounter (Signed)
10/31/11 9:07am pt pick-up medication during her appt./sh

## 2011-10-31 NOTE — Progress Notes (Signed)
   THERAPIST PROGRESS NOTE  Session Time:  9:00 - 10:00 a.m.  Participation Level: Active  Behavioral Response: CasualDrowsyDepressed  Type of Therapy: Individual Therapy  Treatment Goals addressed: Coping  Interventions: Strength-based and Supportive  Summary: Lindsay [name] is a 41 y.o. female who presents with anxiety and depression. She was referred by [name], PA.  Patient admits that she only went to church once since her last session.  She reports she does not know why she did not go.  She reports basically she took care of her daughter and stayed home.  Patient was focused on the changes in her medication due to Medicare not paying for what was prescribed.  Overall patient has done very little to help herself in the last three weeks.  Suicidal/Homicidal: Nowithout intent/plan  Therapist Response:  Discussed with patient that this writer the idea that there cannot be anymore done for her if she was not willing to take care of herself.  Did discuss patient being very fatigued and that if she gets her medications right it may help her with her energy levels.  Assessed for depression and suicide and patient is depressed but shows no evidence of suicide, only fatigue.  Plan: Return again in 1 weeks.  Diagnosis: Axis I: Major depressive d/o moderate; Panic d/o    Axis II: Deferred    Caci Orren, LMFT 10/31/2011

## 2011-11-07 ENCOUNTER — Ambulatory Visit (INDEPENDENT_AMBULATORY_CARE_PROVIDER_SITE_OTHER): Payer: Medicare Other | Admitting: Marriage and Family Therapist

## 2011-11-07 DIAGNOSIS — F410 Panic disorder [episodic paroxysmal anxiety] without agoraphobia: Secondary | ICD-10-CM

## 2011-11-07 DIAGNOSIS — F331 Major depressive disorder, recurrent, moderate: Secondary | ICD-10-CM

## 2011-11-07 NOTE — Progress Notes (Signed)
   THERAPIST PROGRESS NOTE  Session Time:  1:00 - 2:00 p.m.  Participation Level: Active  Behavioral Response: Patient sick, completed session by telephoneDrowsyDepressed  Type of Therapy: Individual Therapy  Treatment Goals addressed: Coping  Interventions: Strength-based and Supportive  Summary: Lindsay [name] is a 41 y.o. female who presents with depression and anxiety.  She was referred by [name], PA.  Note that patient was unable to come in to therapy and requested a telephone session.  Patient reports she is feeling depressed because of her medical condition (patient reports continued chronic intense pain).  She also talked about not being able to get the help she needs because "no one can find out what is wrong with me."  Patient also talked about not being able to get to church due to her pain.  She talked about having a "soul sickness," relating to her psychological condition effecting her medical condition.  Suicidal/Homicidal: Nowithout intent/plan  Therapist Response:  Patient presented with depression today evidenced by her self-report.  Discussed with patient how the mental can effect the physical.  Also continue to encourage patient not only going to church (something patient states is important to her) but also talking more to her pastor and his wife about how she is really doing (patient states she trusts them).  Plan: Return again in 1 weeks.  Diagnosis: Axis I: Major depressive d/o, moderate; panic d/o    Axis II: Deferred    Mathius Birkeland, LMFT 11/07/2011

## 2011-11-14 ENCOUNTER — Ambulatory Visit (INDEPENDENT_AMBULATORY_CARE_PROVIDER_SITE_OTHER): Payer: Medicare Other | Admitting: Marriage and Family Therapist

## 2011-11-14 DIAGNOSIS — F331 Major depressive disorder, recurrent, moderate: Secondary | ICD-10-CM

## 2011-11-14 DIAGNOSIS — F410 Panic disorder [episodic paroxysmal anxiety] without agoraphobia: Secondary | ICD-10-CM | POA: Diagnosis not present

## 2011-11-14 NOTE — Progress Notes (Signed)
   THERAPIST PROGRESS NOTE  Session Time:  9:00 - 10:00 a.m.  Participation Level: Active  Behavioral Response: CasualAlertAnxious and Depressed  Type of Therapy: Individual Therapy  Treatment Goals addressed: Coping  Interventions: Strength-based, Supportive and Family Systems  Summary: Lindsay [name] is a 41 y.o. female who presents with depression and anxiety.  She was referred by [name], PA.  Patient reports she has taken two days of Cymbalta for "my spinning thoughts."  She reports noticing she has less intrusive thoughts.  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  She talked at length about her having "soul-sickness".  Patient states she believes this is her biggest problem.  She wrote a poem about this sickness in her.  She also stated she has a battle going in in her where one part of her "continues to do the wrong thing and the other part of her wants to be healthy and do the right thing." patient states it is difficult to move forward in her life because of her environment.  Patient wants to record what goes on in her house and bring in to session to validate her home stressors leading to why it is so difficult for her to move forward consistently.  Suicidal/Homicidal: Nowithout intent/plan  Therapist Response:  Patient continues to express depression, anxiety.  Continue to help and educate patient relating to healthy behaviors.  Will continue to use the term "soul-sickness" in her moving forward since this term seems to have stayed with patient.  Plan: Return again in 1 weeks.  Diagnosis: Axis I: Major depressive d/o, moderate; Panic  d/o    Axis II: Deferred    Laurie Penado, LMFT 11/14/2011

## 2011-11-18 ENCOUNTER — Other Ambulatory Visit (HOSPITAL_COMMUNITY): Payer: Self-pay | Admitting: Physician Assistant

## 2011-11-18 DIAGNOSIS — F988 Other specified behavioral and emotional disorders with onset usually occurring in childhood and adolescence: Secondary | ICD-10-CM

## 2011-11-18 MED ORDER — AMPHETAMINE-DEXTROAMPHET ER 20 MG PO CP24: 20.0000 mg | ORAL_CAPSULE | ORAL | Status: DC

## 2011-11-21 ENCOUNTER — Ambulatory Visit (HOSPITAL_COMMUNITY): Payer: Self-pay | Admitting: Marriage and Family Therapist

## 2011-11-28 ENCOUNTER — Ambulatory Visit (INDEPENDENT_AMBULATORY_CARE_PROVIDER_SITE_OTHER): Payer: Medicare Other | Admitting: Marriage and Family Therapist

## 2011-11-28 DIAGNOSIS — F410 Panic disorder [episodic paroxysmal anxiety] without agoraphobia: Secondary | ICD-10-CM

## 2011-11-28 DIAGNOSIS — F331 Major depressive disorder, recurrent, moderate: Secondary | ICD-10-CM

## 2011-11-28 NOTE — Progress Notes (Signed)
   THERAPIST PROGRESS NOTE  Session Time:  11:00 - Noon  Participation Level: Active  Behavioral Response: CasualAlertDepressed  Type of Therapy: Individual Therapy  Treatment Goals addressed: Coping  Interventions: Strength-based and Supportive  Summary: Lindsay [name] is a 41 y.o. female who presents with depression and anxiety.  Patient was referred by [name], PA.  Patient continues to focus on her health and states it makes it very difficult to focus on her mental health.  She reports she has not done anything to get medical help due to trying in the past and "not being taken seriously."  She reports she is in a lot of pain throughout her body due to multiple problems.  Patient did state she was "changing her soul suffering" through the church.  She reports talking to the pastor's wife about getting together food bags for the hungry by using teens who are troubled.  She spent the remainder talking about being worried about her daughter who is sexually acting out.  Patient states she is not sure if the ADHD medication is working but that it has been increased to 20 mg. Daily.    Suicidal/Homicidal: Nowithout intent/plan  Therapist Response:  It has become difficult to help patient focus on decreasing her mental health symptoms since she is so focused on her medical symptoms and her daughter.  Commended patient for being able to focus on getting more involved in the church.  Plan: Return again in 1 weeks.  Diagnosis: Axis I: Major depressive d/o, moderate; Panic d/o    Axis II: Deferred    Lindsay Roberson, LMFT 11/28/2011

## 2011-12-04 ENCOUNTER — Other Ambulatory Visit: Payer: Self-pay | Admitting: Internal Medicine

## 2011-12-04 MED ORDER — PROMETHAZINE HCL 12.5 MG PO TABS: 12.5000 mg | ORAL_TABLET | Freq: Four times a day (QID) | ORAL | Status: DC | PRN

## 2011-12-05 ENCOUNTER — Ambulatory Visit (INDEPENDENT_AMBULATORY_CARE_PROVIDER_SITE_OTHER): Payer: Medicare Other | Admitting: Marriage and Family Therapist

## 2011-12-05 DIAGNOSIS — F410 Panic disorder [episodic paroxysmal anxiety] without agoraphobia: Secondary | ICD-10-CM | POA: Diagnosis not present

## 2011-12-05 DIAGNOSIS — F331 Major depressive disorder, recurrent, moderate: Secondary | ICD-10-CM | POA: Diagnosis not present

## 2011-12-05 NOTE — Progress Notes (Signed)
   THERAPIST PROGRESS NOTE  Session Time:  11:00 - Noon  Participation Level: Active  Behavioral Response: CasualAlertIrritable  Type of Therapy: Individual Therapy  Treatment Goals addressed: Coping  Interventions: Other: Reality therapy; confrontational  Summary: Lindsay [name] is a 41 y.o. female who presents with depression and anxiety.  She was referred by [name].  Patient talked about "all the deaths in the past week."  Patient also continued to talk about her daughter as well as her mother relating to the negativity in the house.  She reports she has not been to church, and could not come up with any self-care. She also continues to report health problems.  She admits she started drinking.  She reports "I hope I'm not starting another problem."  She stated, "I've got to focus."  She did state she stopped biting her nails, indicating that the ADHD medication may be calming her down.  Suicidal/Homicidal: Nowithout intent/plan  Therapist Response:  This Clinical research associate asked specifics relating to who died and patient stated a friend's uncle but also what she was hearing on the news.  Told her to stop watching the news.  The entire session was spent talking about others and their problems, complaining about her health but not seeing a doctor, and has started drinking.  Since patient continuously is not taking care of herself. Confronted patient about her doing little to nothing to help herself in a healthy way and this writer did not know anything else that could be done in therapy unless she helped herself.  Left her with this thought.   Plan: Return again in 1 weeks.  Diagnosis: Axis I: Major depressive d/o, moderate; Panic d/o    Axis II: Deferred    [name], LMFT 12/05/2011

## 2011-12-12 ENCOUNTER — Ambulatory Visit (INDEPENDENT_AMBULATORY_CARE_PROVIDER_SITE_OTHER): Payer: Medicare Other | Admitting: Marriage and Family Therapist

## 2011-12-12 DIAGNOSIS — F410 Panic disorder [episodic paroxysmal anxiety] without agoraphobia: Secondary | ICD-10-CM

## 2011-12-12 DIAGNOSIS — F331 Major depressive disorder, recurrent, moderate: Secondary | ICD-10-CM

## 2011-12-12 NOTE — Progress Notes (Signed)
   THERAPIST PROGRESS NOTE  Session Time:  Noon - 1 p.m.  Participation Level: Active  Behavioral Response: CasualAlertDepressed  Type of Therapy: Individual Therapy  Treatment Goals addressed: Coping  Interventions: Solution Focused  Summary: Lindsay [name] is a 41 y.o. female who presents with depression and anxiety.  She was referred by [name], PA.  Patient came in stating she continues not to feel well.  She also admitted that she did nothing to take care of herself this past week.  Suicidal/Homicidal: Nowithout intent/plan  Therapist Response:  Because of patient's lack of progress over the past six years, this writer had to give patient an ultimatum to start making some changes in her self-care in order to alleviate her mental health symptoms.  Gave patient 30 days (wrote letter to patient stating same along with several other referrals).  Was specific in what is expected of her including multiple self-care suggestions given to patient throughout the years including the most recent being patient beginning daily help to alleviate her "soul sickness."  Patient admits she has not accomplished any part of her treatment plan because she is overwhelmed.  She also refused any suggestions.  Patient gave multiple reasons why she cannot progress i.e., "no one will help me, the women at the church are bitches so I can't be involved in the church, everyone comes to me for help."  She will not go anywhere else if she cannot come here (threatening).  Gave patient the book, "Man's search for meaning" by Marlin Canary, who found beauty in a concentration [place].  She understands that this is her homework and will discuss in next week's session.  Plan: Return again in 1 weeks.  Diagnosis: Axis I: Major depressive d/o, moderate; panic d/o    Axis II: Deferred    Sofi Bryars, LMFT 12/12/2011

## 2011-12-18 ENCOUNTER — Ambulatory Visit (HOSPITAL_COMMUNITY): Payer: Self-pay | Admitting: Physician Assistant

## 2011-12-19 ENCOUNTER — Ambulatory Visit (HOSPITAL_COMMUNITY): Payer: Self-pay | Admitting: Marriage and Family Therapist

## 2011-12-19 ENCOUNTER — Telehealth (HOSPITAL_COMMUNITY): Payer: Self-pay | Admitting: Marriage and Family Therapist

## 2011-12-19 NOTE — Progress Notes (Unsigned)
   THERAPIST PROGRESS NOTE  Session Time:  10:00 - 11:00 a.m.  Participation Level: Did Not Attend  Behavioral Response: {Appearance:22683}{BHH LEVEL OF CONSCIOUSNESS:22305}{BHH MOOD:22306}  Type of Therapy: Individual Therapy  Treatment Goals addressed: Coping  Interventions: {CHL AMB BH Type of Intervention:21022753}  Summary: Lindsay [name] is a 41 y.o. female who presents with depression and anxiety.  She was referred by [name], PA.   Suicidal/Homicidal: {BHH YES OR NO:22294}{yes/no/with/without intent/plan:22693}  Therapist Response: ***PATIENT DID NOT SHOW FOR APPOINTMENT.  THIS WRITER DID SPEAK WITH HER.  SHE STATED SHE FORGOT.  Plan: Return again in 1 weeks.  Diagnosis: Axis I: Major depressive d/o moderate; Panic d/o    Axis II: Deferred    Dalayna Lauter, LMFT 12/19/2011

## 2011-12-19 NOTE — Telephone Encounter (Signed)
Patient did not show for her session.  Called and patient picked up.  She reports forgetting both this session and her appointment with [name], PA yesterday.  She reports after her session last week she "shut down," and has been forgetful.  Patient did say she had been writing lists of short- and long-term goals that she would like to achieve.  Told patient that she has until January 30, 2012 to make changes discussed in last-week's session.  Patient agreed.  Cleophas Dunker, LMFT, CTS

## 2011-12-20 ENCOUNTER — Telehealth (HOSPITAL_COMMUNITY): Payer: Self-pay

## 2011-12-20 DIAGNOSIS — F410 Panic disorder [episodic paroxysmal anxiety] without agoraphobia: Secondary | ICD-10-CM

## 2011-12-20 DIAGNOSIS — F988 Other specified behavioral and emotional disorders with onset usually occurring in childhood and adolescence: Secondary | ICD-10-CM

## 2011-12-20 DIAGNOSIS — F329 Major depressive disorder, single episode, unspecified: Secondary | ICD-10-CM

## 2011-12-20 MED ORDER — DULOXETINE HCL 60 MG PO CPEP: 60.0000 mg | ORAL_CAPSULE | Freq: Every day | ORAL | Status: DC

## 2011-12-20 MED ORDER — CLORAZEPATE DIPOTASSIUM 15 MG PO TABS: ORAL_TABLET | ORAL | Status: DC

## 2011-12-20 MED ORDER — GABAPENTIN 400 MG PO CAPS: 800.0000 mg | ORAL_CAPSULE | Freq: Three times a day (TID) | ORAL | Status: DC

## 2011-12-20 MED ORDER — AMPHETAMINE-DEXTROAMPHET ER 20 MG PO CP24: 20.0000 mg | ORAL_CAPSULE | ORAL | Status: DC

## 2011-12-20 NOTE — Telephone Encounter (Signed)
Pt requested refills.  Needs to schedule a follow-up appt, as she no-showed earlier this week.  Refills authorized.

## 2012-01-02 ENCOUNTER — Ambulatory Visit (INDEPENDENT_AMBULATORY_CARE_PROVIDER_SITE_OTHER): Payer: Medicare Other | Admitting: Marriage and Family Therapist

## 2012-01-02 ENCOUNTER — Telehealth (HOSPITAL_COMMUNITY): Payer: Self-pay

## 2012-01-02 DIAGNOSIS — F331 Major depressive disorder, recurrent, moderate: Secondary | ICD-10-CM

## 2012-01-02 DIAGNOSIS — F410 Panic disorder [episodic paroxysmal anxiety] without agoraphobia: Secondary | ICD-10-CM

## 2012-01-02 NOTE — Progress Notes (Signed)
   THERAPIST PROGRESS NOTE  Session Time:  10:00 - 11:00 a.m.  Participation Level: Active  Behavioral Response: CasualAlertAnxious  Type of Therapy: Individual Therapy  Treatment Goals addressed: Coping  Interventions: Supportive  Summary: Lindsay [name] is a 41 y.o. female who presents with depression, anxiety.  She was referred by [name], PA.  Patient started the session talking about her self-care and what she has been thinking about doing.  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  She has been interacting with the teacher in a way a daughter and mother does, lovingly and with support.  Patient states she wants to have her teacher in for a session to discuss ways to help patient develop a better pattern of self-care.  Patient also reports she accidently (her word) took twice the dosage of Adderall (40 mg).  She reports "I could sit down and my thoughts were not spiraling and I did not feel rushed."  Patient admitted that she did not know how to start to take care of herself and asked for help from this writer.  Suicidal/Homicidal: Nowithout intent/plan  Therapist Response:  Discussed patient talking to [name], PA about her experience on 40 mg of Adderall.  Told patient this Clinical research associate has nothing to with her medications and did not know the average dose of Adderall.  Regarding self-care, on patient's IPAD she wrote down columns: physical, emotional, and spiritual and helped patient come up with what she could do over the next week to help herself pertaining to each category.  For physical patient states she will make an appointment with her primary care physician and her surgeon relating to multiple symptoms she is experiencing.  Under emotional she will contact her teach to ask her to help her including coming in for a session (patient has a difficult time asking for help for herself so this will be progress).  Under spiritual  patient will contact the pastor's wife and start talking about what is happening with her relating to her needing help.  If she cannot get in touch the the pastor's wife and will go to the next person in the church until she gets to talk to someone.  She will report how she does in her next session.  Plan: Return again in 1 weeks.  Diagnosis: Axis I: Major depressive d/o moderate; Panic d/o    Axis II: Deferred    Srijan Givan, LMFT 01/02/2012

## 2012-01-09 ENCOUNTER — Ambulatory Visit (INDEPENDENT_AMBULATORY_CARE_PROVIDER_SITE_OTHER): Payer: Medicare Other | Admitting: Marriage and Family Therapist

## 2012-01-09 DIAGNOSIS — F410 Panic disorder [episodic paroxysmal anxiety] without agoraphobia: Secondary | ICD-10-CM

## 2012-01-09 DIAGNOSIS — F331 Major depressive disorder, recurrent, moderate: Secondary | ICD-10-CM

## 2012-01-09 NOTE — Progress Notes (Signed)
   THERAPIST PROGRESS NOTE  Session Time:  10:00 - 11:00 a.m.  Participation Level: Active  Behavioral Response: CasualAlertAnxious and Depressed (mild)  Type of Therapy: Individual Therapy  Treatment Goals addressed: Coping  Interventions: Strength-based and Supportive  Summary: Lindsay [name] is a 41 y.o. female who presents with anxiety and depression.  She was referred by [name], PA.  Patient came in with her second-grade teacher, Marylu Lund (see release).  Patient wanted to bring her teacher in because she has been willing to help patient.  Discussed in detail ways her teacher can be helpful including helping patient with her daughter's needs being provided for at school.  Also discussed patient's situation physically, emotionally, and her situation and discussed how patient's life changed when her father died.  Discussed patient not being able to obtain her GED because of lack of support.  Patient states that her teacher is part of her self-care (her words).  She reports feeling very happy she found the teacher and how much she is looking forward to spending time with her.  Patient's teacher admitted that helping patient was also for her own self-care because she needed to feel like she was making a difference in someone's life, particularly since she retired and no longer taught students.  Suicidal/Homicidal: Nowithout intent/plan  Therapist Response:  Overall patient and her teacher did most of the talking.  The purpose was to give them a forum to discuss specifics on how her teacher can help her and that her teacher would commit to following through on the help.  Each made a plan on how they would utilize each other's help.  Patient has an appointment at her daughter's school and her teacher will be going with her next week. They will also be getting together to go to church.  Will discuss in next session how patient is utilizing this help.  Plan: Return again in 1  weeks.  Diagnosis: Axis I: Major depressive d/o, moderate; Panic d/o    Axis II: Deferred    Aviraj Kentner, LMFT 01/09/2012

## 2012-01-15 ENCOUNTER — Other Ambulatory Visit (HOSPITAL_COMMUNITY): Payer: Self-pay | Admitting: *Deleted

## 2012-01-15 DIAGNOSIS — F988 Other specified behavioral and emotional disorders with onset usually occurring in childhood and adolescence: Secondary | ICD-10-CM

## 2012-01-15 MED ORDER — AMPHETAMINE-DEXTROAMPHET ER 20 MG PO CP24: 20.0000 mg | ORAL_CAPSULE | ORAL | Status: DC

## 2012-01-15 NOTE — Telephone Encounter (Signed)
Patient requested refill of Adderall. States she wants to talk to provider about increasing dose.  Informed pt that she should discuss this with provider at next appt 02/24/11.

## 2012-01-15 NOTE — Progress Notes (Signed)
   THERAPIST PROGRESS NOTE  Session Time:  10:00 - 11:00 a.m.  Participation Level: Active  Behavioral Response: CasualConfusedAnxious  Type of Therapy: Individual Therapy  Treatment Goals addressed: Coping  Interventions: Strength-based and Supportive  Summary: Lindsay [name] is a 41 y.o. female who presents with depression and anxiety.  She was referred by [name], PA.  Patient talked about her new relationship with her school teacher and what that could mean to her, getting back into the church, having support from her friend's wife.  She talked about a school meeting for her daughter and how she was trying to find time to get her daughter Christmas presents.  She talked about her relationship with her mother and being worried about mom's health.  Suicidal/Homicidal: Nowithout intent/plan  Therapist Response:  Patient had to be interrupted multiple times because she was jumping from one topic to another and also not making sense at times.  Made several attempts to keep patient on task and had to point out to her that she was not making senseShe reported she was "spiraling" because she has not had her Adderall for six days because the medication was damaged (spoke to [name] who did see her).    Plan: Return again in 2 weeks.  Diagnosis: Axis I: Major depressive d/o, moderate; Panic d/o    Axis II: Deferred    Madilynne Mullan, LMFT 01/15/2012

## 2012-01-16 ENCOUNTER — Ambulatory Visit (INDEPENDENT_AMBULATORY_CARE_PROVIDER_SITE_OTHER): Payer: Medicare Other | Admitting: Marriage and Family Therapist

## 2012-01-16 DIAGNOSIS — F410 Panic disorder [episodic paroxysmal anxiety] without agoraphobia: Secondary | ICD-10-CM | POA: Diagnosis not present

## 2012-01-16 DIAGNOSIS — F331 Major depressive disorder, recurrent, moderate: Secondary | ICD-10-CM | POA: Diagnosis not present

## 2012-01-17 ENCOUNTER — Other Ambulatory Visit: Payer: Self-pay | Admitting: *Deleted

## 2012-01-17 MED ORDER — DICYCLOMINE HCL 20 MG PO TABS: 20.0000 mg | ORAL_TABLET | Freq: Four times a day (QID) | ORAL | Status: DC

## 2012-01-20 ENCOUNTER — Encounter: Payer: Self-pay | Admitting: Internal Medicine

## 2012-01-30 ENCOUNTER — Ambulatory Visit (INDEPENDENT_AMBULATORY_CARE_PROVIDER_SITE_OTHER): Payer: Medicare Other | Admitting: Marriage and Family Therapist

## 2012-01-30 DIAGNOSIS — F331 Major depressive disorder, recurrent, moderate: Secondary | ICD-10-CM

## 2012-01-30 DIAGNOSIS — F410 Panic disorder [episodic paroxysmal anxiety] without agoraphobia: Secondary | ICD-10-CM | POA: Diagnosis not present

## 2012-01-30 NOTE — Progress Notes (Signed)
   THERAPIST PROGRESS NOTE  Session Time:  10:00 - 11:00 a.m.  Participation Level: Active  Behavioral Response: CasualAlertAnxious and Depressed  Type of Therapy: Individual Therapy  Treatment Goals addressed: Coping  Interventions: Strength-based and Supportive  Summary: Lindsay [name] is a 42 y.o. female who presents with depression and anxiety.  She was referred by [name], PA.  Patient cried throughout the session.  She reports "everyone in my life is getting on my case."  She reports her sister and mother are telling her she is angry "all the time" but patient does not see her anger.  She reports she does not feel the support here since she is on "probation."  She reports she does not feel the support at church.  However, patient reports on New Year's Eve she went to the church at midnight, fell to her knees, cried and prayed.  She reports it was a service and she told the members she "needs help and is not strong enough to cope with the world outside of church."  She did reports getting a lot of support from the members.    Suicidal/Homicidal: Nowithout intent/plan  Therapist Response:  Discussed patient being at a "crossroad" in her life.  Discussed keeping in mind the three areas of her life, physical, psychological/emotional, and spiritual as the areas where she needs to change.  Also discussed the idea of "what can I do today to make myself better?"  Discussed patient "surrendering," what this word means.  Discussed patient not listening to her negative thoughts and trying to control her life but to listen to what others are telling her and then do what they tell her to do (through church).  Discussed patient allowing her ego to get in the way of her gaining support.  Also discussed patient not having this guidance until now.  Patient equated [place]."  Patient will use these discussions to continue with her self-care daily.  Plan: Return again in 1  weeks.  Diagnosis: Axis I: Major depressive d/o, moderate; Panic d/o    Axis II: Deferred    Diamantina Edinger, LMFT, CTS 01/30/2012

## 2012-02-03 ENCOUNTER — Other Ambulatory Visit: Payer: Self-pay | Admitting: *Deleted

## 2012-02-03 DIAGNOSIS — R51 Headache: Secondary | ICD-10-CM

## 2012-02-04 ENCOUNTER — Ambulatory Visit (HOSPITAL_COMMUNITY): Payer: Medicare Other | Admitting: Marriage and Family Therapist

## 2012-02-04 ENCOUNTER — Telehealth: Payer: Self-pay | Admitting: *Deleted

## 2012-02-04 ENCOUNTER — Other Ambulatory Visit: Payer: Self-pay | Admitting: *Deleted

## 2012-02-04 MED ORDER — ESOMEPRAZOLE MAGNESIUM 40 MG PO CPDR: 40.0000 mg | DELAYED_RELEASE_CAPSULE | Freq: Every day | ORAL | Status: DC

## 2012-02-04 NOTE — Telephone Encounter (Signed)
Talked with pt  - to make appt to be seen in clinic. Pt wants Phenergan.

## 2012-02-04 NOTE — Progress Notes (Signed)
This encounter was created in error - please disregard.

## 2012-02-04 NOTE — Telephone Encounter (Signed)
Ms. Hartney has not been seen by the clinic since 03/2011. She has had refill requests refused due to needing an appointment and has had a letter asking her to schedule an appointment.   From our records, she has not had these medications(phenergan/vicodin) filled since 03/2011 as well.  Will only refill her omeprazole at this time, however, I won't refill phenergan/vicodin or diflucan until she is seen and I can assess for needs/reasoning for continued therapy.  She will also need a pain contract and discussion re: goals of care for narcotics (headache not necessarily an appropriate diagnosis, but I can assess this better when I see her).    Also, if she is having a yeast infection (diflucan) she could get OTC medication for that until she is seen.   Thanks  Can you please seen another appointment letter out?  EBM

## 2012-02-04 NOTE — Telephone Encounter (Addendum)
Pt calls c/o because her meds were not refilled, she states she mainly needs phenergan at this time, she is informed about dr mullen's answer to refills, she is made an appt for 1/13 at 0815 dr sadek per charsetta h., she has multiple complaints and is informed that the visit will be for her nausea and all other issues will be addressed at her appt w/ dr Criselda Peaches unless dr Burtis Junes feels others are emergent, she is informed she may go to ED or urg care if the problem cannot wait until 1/13. She starts naming all the concerns that she has at the moment... H/a, draining areas, birth control device issues... She is told that the ED or urg care are for emergent issues not problems you would address at a pcp visit and is ask to only address the nausea but that she will be evaluated by the md at [facility] care and he/ she will decide what can wait.

## 2012-02-05 ENCOUNTER — Ambulatory Visit (INDEPENDENT_AMBULATORY_CARE_PROVIDER_SITE_OTHER): Payer: Medicare Other | Admitting: Physician Assistant

## 2012-02-05 DIAGNOSIS — F39 Unspecified mood [affective] disorder: Secondary | ICD-10-CM

## 2012-02-05 DIAGNOSIS — F411 Generalized anxiety disorder: Secondary | ICD-10-CM | POA: Diagnosis not present

## 2012-02-05 DIAGNOSIS — F909 Attention-deficit hyperactivity disorder, unspecified type: Secondary | ICD-10-CM | POA: Diagnosis not present

## 2012-02-05 DIAGNOSIS — F988 Other specified behavioral and emotional disorders with onset usually occurring in childhood and adolescence: Secondary | ICD-10-CM

## 2012-02-05 MED ORDER — PROMETHAZINE HCL 12.5 MG PO TABS: 12.5000 mg | ORAL_TABLET | Freq: Four times a day (QID) | ORAL | Status: DC | PRN

## 2012-02-05 MED ORDER — AMPHETAMINE-DEXTROAMPHET ER 25 MG PO CP24: 25.0000 mg | ORAL_CAPSULE | ORAL | Status: DC

## 2012-02-05 NOTE — Telephone Encounter (Signed)
Will refill X 1 until her appointment with Dr. Burtis Junes, next week.  If she takes TID, this will give her a 10 day supply.   Thanks  EBM

## 2012-02-05 NOTE — Telephone Encounter (Signed)
I filled her phenergan for a 10 day supply, to get her through until she sees Dr. Burtis Junes.  She has not had these meds from [name] since 03/2011.  Regardless, Dr. Burtis Junes can address her needs for further nausea medication at the appointment.  She also has one with me at the end of the month I believe and we can address more chronic issues then.  Thanks!  EBM

## 2012-02-05 NOTE — Progress Notes (Signed)
   [facility] Follow-up Outpatient Visit  Lindsay [name] 03/27/1970  Date: 02/05/2012   Subjective: Lindsay [name] presents today to followup on her treatment for mood disorder, anxiety, and ADHD. She complains that the holidays have been rough. She states that it is cold in her house and she has to sleep and her close. She reports that her daughter has been diagnosed with some food intolerance, and she has to be on a special diet. Velvia needs to watch what she feeds her daughter, and his having to cook much more than she is used to. She feels that the Adderall XO R. 20 mg has helped her in that she is more able to organize her thoughts, and she has stopped biting her fingernails. She is more able to talk with people and build her sleep port network. She also reports that it helps her with cravings for marijuana and nicotine. She reports that she continues to have some disorganization and racing of thought, and feels an increase in Adderall may help more with that.. She states that the Adderall lasts about 12 hours. She states that her mood is stable.  There were no vitals filed for this visit.  Mental Status Examination  Appearance: Well groomed and casually dressed Alert: Yes Attention: good  Cooperative: Yes Eye Contact: Good Speech: Clear and coherent Psychomotor Activity: Psychomotor Retardation Memory/Concentration: Intact Oriented: person, place, time/date and situation Mood: Dysphoric Affect: Blunt Thought Processes and Associations: Logical Fund of Knowledge: Fair Thought Content: Normal Insight: Fair Judgement: Good  Diagnosis: Mood disorder NOS, generalized anxiety disorder, ADHD  Treatment Plan: We will increase her Adderall XR to 25 mg daily, continue her Cymbalta 60 mg daily, Neurontin 400 mg 3 times daily, and clorazepate 30 mg twice daily and 15 mg in the evening.  She has an appointment already scheduled for January 27, and she will followup at that  time.  Samay Delcarlo, PA-C

## 2012-02-05 NOTE — Telephone Encounter (Signed)
Pt aware.

## 2012-02-07 NOTE — Telephone Encounter (Signed)
i have tried to call pt 3 times, i have left messages no rtc as of yet

## 2012-02-10 ENCOUNTER — Ambulatory Visit: Payer: Self-pay | Admitting: Internal Medicine

## 2012-02-10 ENCOUNTER — Telehealth: Payer: Self-pay | Admitting: *Deleted

## 2012-02-10 NOTE — Telephone Encounter (Signed)
Thank you for resch

## 2012-02-10 NOTE — Telephone Encounter (Signed)
Pt calls and states transportation has not arrived and when called they state she will be next, she is ask to cancel this am and to be here at 1345 today for dr Collier Bullock clinic, she is agreeable

## 2012-02-13 ENCOUNTER — Ambulatory Visit (INDEPENDENT_AMBULATORY_CARE_PROVIDER_SITE_OTHER): Payer: Medicare Other | Admitting: Marriage and Family Therapist

## 2012-02-13 DIAGNOSIS — F331 Major depressive disorder, recurrent, moderate: Secondary | ICD-10-CM

## 2012-02-13 DIAGNOSIS — F410 Panic disorder [episodic paroxysmal anxiety] without agoraphobia: Secondary | ICD-10-CM | POA: Diagnosis not present

## 2012-02-13 NOTE — Progress Notes (Signed)
   THERAPIST PROGRESS NOTE  Session Time:  10:00 - 11:00 a.m.  Participation Level: Active  Behavioral Response: CasualAlertAnxious and Depressed  Type of Therapy: Individual Therapy  Treatment Goals addressed: Coping  Interventions: Strength-based and Supportive  Summary: Lindsay [name] is a 42 y.o. female who presents with depression and anxiety.  She was referred by [name], PA.   Patient reports she has experienced some roadblocks pertaining to her self-care.  She reports the people who have been handling her daughter's fund are stopping patient from spending the monies due to their concern that her daughter will not have any money by age 43.  She reports she no longer want her daughter to use a QP.  The daughter's QP has been helpful in patient getting some time off relating to the daughter's special needs.  She also reports her QP has told patient the reason her daughter calls her so frequently is because she is concerned about her mother's health.  She also states she has seen very little of her second grade teacher but that she expressed to her teacher that she was not happy about not spending time together.  She also reports she has not been in church.  She has also made appointments with physicians relating to multiple medical problems including a new primary physician.  She said their office called to cancel her appointment.   She is concerned that she will not be able to make appointments due to transportation problems as well. In spite of these setbacks, patient states she is not going to let these roadblocks get in her way.    Suicidal/Homicidal: Nowithout intent/plan  Therapist Response:  Discussed patient appealing the decision to stop some of the monies of her daughter, particularly around the daughter having a QP.  Discussed patient not seeing her teacher, stating that the teacher agreed to spend time with her.  Also commended patient for telling her teacher how she felt.     Plan: Return again in 2 weeks.  Diagnosis: Axis I: Major depressive disorder, moderate; Panic d/o    Axis II: Deferred    Bretta Fees, LMFT, CTS 02/13/2012

## 2012-02-18 NOTE — Progress Notes (Signed)
This encounter was created in error - please disregard.

## 2012-02-18 NOTE — Telephone Encounter (Signed)
No contact.

## 2012-02-19 ENCOUNTER — Other Ambulatory Visit (HOSPITAL_COMMUNITY): Payer: Self-pay | Admitting: Physician Assistant

## 2012-02-19 DIAGNOSIS — F39 Unspecified mood [affective] disorder: Secondary | ICD-10-CM

## 2012-02-24 ENCOUNTER — Ambulatory Visit (INDEPENDENT_AMBULATORY_CARE_PROVIDER_SITE_OTHER): Payer: Medicare Other | Admitting: Physician Assistant

## 2012-02-24 DIAGNOSIS — F988 Other specified behavioral and emotional disorders with onset usually occurring in childhood and adolescence: Secondary | ICD-10-CM | POA: Diagnosis not present

## 2012-02-24 DIAGNOSIS — F39 Unspecified mood [affective] disorder: Secondary | ICD-10-CM | POA: Diagnosis not present

## 2012-02-24 DIAGNOSIS — F3289 Other specified depressive episodes: Secondary | ICD-10-CM

## 2012-02-24 DIAGNOSIS — F410 Panic disorder [episodic paroxysmal anxiety] without agoraphobia: Secondary | ICD-10-CM | POA: Diagnosis not present

## 2012-02-24 DIAGNOSIS — F329 Major depressive disorder, single episode, unspecified: Secondary | ICD-10-CM

## 2012-02-24 MED ORDER — AMPHETAMINE-DEXTROAMPHET ER 25 MG PO CP24: 25.0000 mg | ORAL_CAPSULE | ORAL | Status: DC

## 2012-02-24 MED ORDER — AMPHETAMINE-DEXTROAMPHETAMINE 10 MG PO TABS: 10.0000 mg | ORAL_TABLET | Freq: Every day | ORAL | Status: DC

## 2012-02-24 MED ORDER — CLORAZEPATE DIPOTASSIUM 15 MG PO TABS: ORAL_TABLET | ORAL | Status: DC

## 2012-02-24 MED ORDER — GABAPENTIN 400 MG PO CAPS: 800.0000 mg | ORAL_CAPSULE | Freq: Three times a day (TID) | ORAL | Status: DC
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
## 2012-03-03 ENCOUNTER — Ambulatory Visit (INDEPENDENT_AMBULATORY_CARE_PROVIDER_SITE_OTHER): Payer: Medicare Other | Admitting: Marriage and Family Therapist

## 2012-03-03 DIAGNOSIS — F331 Major depressive disorder, recurrent, moderate: Secondary | ICD-10-CM | POA: Diagnosis not present

## 2012-03-03 DIAGNOSIS — F410 Panic disorder [episodic paroxysmal anxiety] without agoraphobia: Secondary | ICD-10-CM
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
## 2012-03-12 ENCOUNTER — Ambulatory Visit (HOSPITAL_COMMUNITY): Payer: Self-pay | Admitting: Marriage and Family Therapist

## 2012-03-18 ENCOUNTER — Ambulatory Visit (INDEPENDENT_AMBULATORY_CARE_PROVIDER_SITE_OTHER): Payer: Medicare Other | Admitting: Marriage and Family Therapist

## 2012-03-18 DIAGNOSIS — F410 Panic disorder [episodic paroxysmal anxiety] without agoraphobia: Secondary | ICD-10-CM | POA: Diagnosis not present

## 2012-03-18 NOTE — Progress Notes (Signed)
   THERAPIST PROGRESS NOTE  Session Time: 10:00 - 11:00 a.m.  Participation Level: Active  Behavioral Response: CasualLethargicDepressed  Type of Therapy: Individual Therapy  Treatment Goals addressed: Coping  Interventions: Strength-based and Supportive  Summary: Lindsay [name] is a 42 y.o. female who presents with depression and anxiety.  She was referred by [name], PA.  Patient reports she has been feeling hopeless and depressed.  She reports having little food in the house and others who do not live there are taking the food.  She reports feeling like she is getting no support from others in her family and in her neighborhood and with friends.  She did state that she has been asking for help from others, including friends and family.  Specifically patient stated her mother said patient does not seem to need the help like patient's sisters need help.  Patient states she has not been in church since January.  She talked about her discussions with her teacher.  She also had difficulty walking today and her feet, ankles, and thighs were very swollen.  Suicidal/Homicidal: Nowithout intent/plan  Therapist Response:  Assessed suicidal ideation and patient denies same.  However, she admits to feeling hopeless.  After discussion it seemed that patient may be feeling hopeless because she has been asking for help but is not getting the help through asking.  Discussed patient not being used to asking for help from her friends, the neighborhood, and her family, therefore, this behavior is new for her.  Discussed patient not going to church since January, the time when she asked for help from the congregation.  She still continues to communicate with her teacher but is afraid she is scaring her teacher because of the amount of help she needs.  She is also not taking care of herself physically, but did promise to go to the emergency room in the next 24 hours.  Plan: Return again in 1  weeks.  Diagnosis: Axis I: Major depressive d/o, moderate, severe; Panic d/o    Axis II: Deferred    Ladarrius Bogdanski, LMFT, CTS 03/18/2012

## 2012-03-25 ENCOUNTER — Ambulatory Visit (HOSPITAL_COMMUNITY): Payer: Self-pay | Admitting: Marriage and Family Therapist

## 2012-03-26 ENCOUNTER — Ambulatory Visit (INDEPENDENT_AMBULATORY_CARE_PROVIDER_SITE_OTHER): Payer: Medicare Other | Admitting: Marriage and Family Therapist

## 2012-03-26 NOTE — Progress Notes (Signed)
   THERAPIST PROGRESS NOTE  Session Time: 10;00 - 11:00 a.m.  Participation Level: Active  Behavioral Response: CasualAlertAngry  Type of Therapy: Individual Therapy  Treatment Goals addressed: Coping  Interventions: Strength-based and Supportive  Summary: Lindsay [name] is a 42 y.o. female who presents with  Depression and anxiety.  Today patient reports she has been asking, "begging," for help from her community and her school teacher.  She reports she has been yelling and arguing with others in the neighborhood because she is asking for help and no one is responding.  She reports she is "looked at like she is not human."  She said she is "tired of 42 years of being treated as a "non-human by others."  She also talked about how she gets no support from her family and tired of her mother "screaming at her."  She believes she scared her teacher away because of needing so much help.   Suicidal/Homicidal: Nowithout intent/plan  Therapist Response:  It was evident by patient's communication and body language and self-report that she is angry and feeling rage over how she has been treated in her life.  Discussed the idea that patient is not one to lash out like she has been lashing out towards others but thinks it is because she has been holding on to "42 years of anger."  She has been in touch with the church for support which is helping.  Discussed how patient has been making changes in her life including trying to use support for self-care with little success.  She believes, the more I ask, the more people don't help me."  Discussed the idea that patient's anger is healthy, and will be discussed more in session.  Told patient not to act out on her anger but she wants to express it through recording and writing and states this is what she is doing.   Plan: Return again in 1 weeks.  Diagnosis: Axis I: Major depressive d/o; moderate; Panic d/o    Axis II: Deferred    Leva Baine, LMFT,  CTS 03/26/2012

## 2012-04-01 ENCOUNTER — Ambulatory Visit (HOSPITAL_COMMUNITY): Payer: Self-pay | Admitting: Marriage and Family Therapist

## 2012-04-02 ENCOUNTER — Ambulatory Visit (HOSPITAL_COMMUNITY): Payer: Self-pay | Admitting: Marriage and Family Therapist

## 2012-04-02 NOTE — Progress Notes (Unsigned)
   THERAPIST PROGRESS NOTE  Session Time: 10:00 - 11;00 a.m.  Participation Level: Active  Behavioral Response: {Appearance:22683}{BHH LEVEL OF CONSCIOUSNESS:22305}{BHH MOOD:22306}  Type of Therapy: Individual Therapy  Treatment Goals addressed: Coping  Interventions: {CHL AMB BH Type of Intervention:21022753}  Summary: Lindsay [name] is a 42 y.o. female who presents with depression and anxiety.  She was referred by [name], PA.   Suicidal/Homicidal: {BHH YES OR NO:22294}{yes/no/with/without intent/plan:22693}  Therapist Response: ***  Plan: Return again in 1 weeks.  Diagnosis: Axis I: Major depressive d/o, severe; Panic d/o    Axis II: Deferred    TOMAR,JOANIE, LMFT, CTS 04/02/2012

## 2012-04-09 ENCOUNTER — Ambulatory Visit (HOSPITAL_COMMUNITY): Payer: Self-pay | Admitting: Marriage and Family Therapist

## 2012-04-16 ENCOUNTER — Ambulatory Visit (INDEPENDENT_AMBULATORY_CARE_PROVIDER_SITE_OTHER): Payer: Medicare Other | Admitting: Marriage and Family Therapist

## 2012-04-16 DIAGNOSIS — F410 Panic disorder [episodic paroxysmal anxiety] without agoraphobia: Secondary | ICD-10-CM
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
## 2012-04-17 ENCOUNTER — Other Ambulatory Visit (HOSPITAL_COMMUNITY): Payer: Self-pay | Admitting: Physician Assistant

## 2012-04-17 DIAGNOSIS — F329 Major depressive disorder, single episode, unspecified: Secondary | ICD-10-CM

## 2012-04-23 ENCOUNTER — Ambulatory Visit (INDEPENDENT_AMBULATORY_CARE_PROVIDER_SITE_OTHER): Payer: Medicare Other | Admitting: Marriage and Family Therapist

## 2012-04-23 DIAGNOSIS — F331 Major depressive disorder, recurrent, moderate: Secondary | ICD-10-CM

## 2012-04-23 DIAGNOSIS — F411 Generalized anxiety disorder: Secondary | ICD-10-CM | POA: Diagnosis not present
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
## 2012-04-27 ENCOUNTER — Telehealth (HOSPITAL_COMMUNITY): Payer: Self-pay | Admitting: *Deleted

## 2012-04-27 ENCOUNTER — Ambulatory Visit (HOSPITAL_COMMUNITY): Payer: Self-pay | Admitting: Physician Assistant

## 2012-04-27 DIAGNOSIS — F329 Major depressive disorder, single episode, unspecified: Secondary | ICD-10-CM

## 2012-04-27 DIAGNOSIS — F410 Panic disorder [episodic paroxysmal anxiety] without agoraphobia: Secondary | ICD-10-CM

## 2012-04-27 DIAGNOSIS — F988 Other specified behavioral and emotional disorders with onset usually occurring in childhood and adolescence: Secondary | ICD-10-CM

## 2012-04-27 MED ORDER — CLORAZEPATE DIPOTASSIUM 15 MG PO TABS: ORAL_TABLET | ORAL | Status: DC

## 2012-04-27 MED ORDER — AMPHETAMINE-DEXTROAMPHET ER 25 MG PO CP24: 25.0000 mg | ORAL_CAPSULE | ORAL | Status: DC

## 2012-04-27 MED ORDER — AMPHETAMINE-DEXTROAMPHETAMINE 10 MG PO TABS
10.0000 mg | ORAL_TABLET | Freq: Every day | ORAL | Status: DC
Start: 2012-04-27 — End: 2012-06-26

## 2012-04-27 MED ORDER — GABAPENTIN 400 MG PO CAPS: 800.0000 mg | ORAL_CAPSULE | Freq: Three times a day (TID) | ORAL | Status: DC

## 2012-04-27 NOTE — Telephone Encounter (Signed)
Verified Cymbalta refilled on 04/17/12 with pharmacy.They state it is available for pick up. Neurontin, Clorazepate and Adderall will be refilled.

## 2012-04-30 ENCOUNTER — Telehealth (HOSPITAL_COMMUNITY): Payer: Self-pay

## 2012-04-30 ENCOUNTER — Ambulatory Visit (INDEPENDENT_AMBULATORY_CARE_PROVIDER_SITE_OTHER): Payer: Medicare Other | Admitting: Marriage and Family Therapist

## 2012-04-30 DIAGNOSIS — F331 Major depressive disorder, recurrent, moderate: Secondary | ICD-10-CM

## 2012-04-30 DIAGNOSIS — F410 Panic disorder [episodic paroxysmal anxiety] without agoraphobia: Secondary | ICD-10-CM

## 2012-04-30 NOTE — Telephone Encounter (Signed)
10:49AM 04/30/12 GAVE PT HER RX SCRIPT BEFORE LEAVING HER APPT/SH

## 2012-04-30 NOTE — Progress Notes (Signed)
   THERAPIST PROGRESS NOTE  Session Time: 10:00 - 11:00 a.m.  Participation Level: Active  Behavioral Response: CasualAlertDepressed  Type of Therapy: Individual Therapy  Treatment Goals addressed: Coping  Interventions: Strength-based and Supportive  Summary: Lindsay [name] is a 42 y.o. female who presents with depression and anxiety.  She was referred by [name], PA.  Patient states she believes over the past few months the events that have been happening in her environment and financially have been getting worse.  She reports feeling confused even though she is on ADHD medications.  She admits that although things are getting worse she continues to try to find resources for herself relating to eventually providing herself with better care.  For example, patient is trying to get an appointment with an attorney to have her daughter's funds released in order to help herself including respite care.  She also talked to the deacon at her church about the difficulties getting her daughter to come to church with her (patient's mother does not want the child home while patient goes to church).  She states the deacon volunteered to come to her home to get her daughter to go (supporting patient.)  Suicidal/Homicidal: Nowithout intent/plan  Therapist Response:  Commended patient for trying to better her life despite difficulties.  Discussed at length what patient is trying to do and what she can continue to do.  She states her homework is to get to church this week with the help of her deacon.  Plan: Return again in 1 weeks.  Diagnosis: Axis I: Major depressive d/o, moderate; Panic d/o    Axis II: Deferred    [name], LMFT, CTS 04/30/2012

## 2012-05-07 ENCOUNTER — Ambulatory Visit (INDEPENDENT_AMBULATORY_CARE_PROVIDER_SITE_OTHER): Payer: Medicare Other | Admitting: Marriage and Family Therapist

## 2012-05-07 DIAGNOSIS — F410 Panic disorder [episodic paroxysmal anxiety] without agoraphobia: Secondary | ICD-10-CM | POA: Diagnosis not present

## 2012-05-07 DIAGNOSIS — F331 Major depressive disorder, recurrent, moderate: Secondary | ICD-10-CM

## 2012-05-07 NOTE — Progress Notes (Signed)
   THERAPIST PROGRESS NOTE  Session Time: 10:00 - 11:00 a.m.  Participation Level: Active  Behavioral Response: CasualConfusedAnxious  Type of Therapy: Individual Therapy  Treatment Goals addressed: Coping  Interventions: Strength-based and Supportive  Summary: Lindsay [name] is a 42 y.o. female who presents with depression and anxiety.  She was referred by [name], PA.  Patient was 20 minutes late for the session due to transportation problems.  She did report going to church for the first time but that she did not get more involved that praying in the church.  She reports it is strange for her because the church members seem to seek her out to try and help her.  She talked about now having any money and is unable to pay her bills.  She also talked about problems at school that her daughter is having.  She talked about "no one caring about me" and wanting to detach from family and friends that are not good for her.  Suicidal/Homicidal: No  Therapist Response:  Patient's communication was tangential and she jumping from one subject to another.  This Clinical research associate pointed this out.  It was very difficult to get patient focused today and it was very difficult following her training of thought.  Patient says she will not be seeing [name] until some time in March and this writer recommended she try to get in sooner.  Patient states she will work this next week to get more involved with people who are healthier and seem to care for her (patient states for her it is the church).  Plan: Return again in 1 weeks.  Diagnosis: Axis I: Major depressive d/o, moderate; Panic d/o    Axis II: Deferred    De Jaworski, LMFT, CTS 05/07/2012

## 2012-05-14 ENCOUNTER — Ambulatory Visit (INDEPENDENT_AMBULATORY_CARE_PROVIDER_SITE_OTHER): Payer: Medicare Other | Admitting: Marriage and Family Therapist

## 2012-05-14 DIAGNOSIS — F331 Major depressive disorder, recurrent, moderate: Secondary | ICD-10-CM | POA: Diagnosis not present

## 2012-05-14 DIAGNOSIS — F410 Panic disorder [episodic paroxysmal anxiety] without agoraphobia: Secondary | ICD-10-CM

## 2012-05-14 NOTE — Progress Notes (Signed)
   THERAPIST PROGRESS NOTE  Session Time: 10:00 - 11:00 a.m.  Participation Level: Active  Behavioral Response: CasualAlertDepressed  Type of Therapy: Individual Therapy  Treatment Goals addressed: Coping  Interventions: Strength-based and Supportive  Summary: Lindsay [name] is a 42 y.o. female who presents with depression and anxiety.  She was referred by [name], PA.  Patient reports her daughter is failing 9th grade and she is trying to get her services.  She admits she has been again focused more on her daughter's needs and not on her own.  Patient talked about health problems continuing and states her daughter's guardianship members have suggested patient put her daughter in foster care and then take the time to take care of herself.  Patient states this is not an option.  Patient did at first say (regarding her homework: reaching out to her church) that she prayed this week.  Suicidal/Homicidal: No  Therapist Response: confronted patient on not focusing on herself.  Suggested to patient that when she first comes in to talk about her homework assignment.  Told patient she would be getting assignments every week in order for her to see progress despite what is going on in her life.  Patient finally reported that she requested of the minister's wife that she would like to volunteer in some capacity.  She talked to another church member about the church being involved in some of patient's ideas around creating a non-profit agency to help teens with mental health problems.  Patient did say she got a good response.  Patient's communication today continues to be tangential and it was difficult to keep her focused on one subject at a time.  However, she is doing her homework.  Continue to push patient to obtain a healthy support through the church since this is what she has requested.    Plan: Return again in 1 weeks.  Diagnosis: Axis I: Major depressive d/o, moderate; Panic d/o    Axis II:  Deferred    Lateef Juncaj, LMFT, CTS 05/14/2012

## 2012-05-21 ENCOUNTER — Ambulatory Visit (HOSPITAL_COMMUNITY): Payer: Self-pay | Admitting: Marriage and Family Therapist

## 2012-05-21 ENCOUNTER — Telehealth (HOSPITAL_COMMUNITY): Payer: Self-pay | Admitting: Marriage and Family Therapist

## 2012-05-21 NOTE — Progress Notes (Unsigned)
   THERAPIST PROGRESS NOTE  Session Time:  9:00 - 10:00 a.m.  Participation Level:  DID NOT ATTEND  Behavioral Response:   Type of Therapy: Individual Therapy  Treatment Goals addressed: Coping  Interventions:   Summary: Lindsay [name] is a 42 y.o. female who presents with anxiety and depression.  She was referred by [name], PA.  PATIENT DID NOT SHOW FOR HER APPOINTMENT.  CALLED HER AND SHE RETURNED THE CALL STATING SHE THOUGHT HER APPOINTMENT WAS AT 10 RATHER THAN 9 A.M.  Suicidal/Homicidal:   Therapist Response: PUT HER IN AS A LATE CANCELLATION.  DISCUSSED WITH PATIENT HER NEED TO DEPEND MORE ON HER  CHURCH GROUP WHEN SHE CANNOT GET IN FOR A SESSION.  PATIENT AGREED THAT SHE WOULD MAKE PLANS THIS WEEK TO CONTACT THEM.  Plan: Return again in 1 weeks.  Diagnosis: Axis I: Major depressive disorder, moderate; Panic d/o    Axis II: Deferred    Kiaan Overholser, LMFT, CTS 05/21/2012

## 2012-05-21 NOTE — Telephone Encounter (Signed)
When patient did not show for her 9 a.m. Appointment called and left message.  Patient immediately called back stating she thought her appointment was for 10 a.m.  She was crying, and stated she needed to get in to "get things off of her chest."  Talked about patient needing to use her church group as her plan A rather than using her therapist as plan A since they are in her life.  Patient promised she would and stated one of the church members was just over her house talking to her.  Cleophas Dunker, LMFT, CTS

## 2012-05-28 ENCOUNTER — Ambulatory Visit (INDEPENDENT_AMBULATORY_CARE_PROVIDER_SITE_OTHER): Payer: Medicare Other | Admitting: Marriage and Family Therapist

## 2012-05-28 DIAGNOSIS — F410 Panic disorder [episodic paroxysmal anxiety] without agoraphobia: Secondary | ICD-10-CM | POA: Diagnosis not present

## 2012-05-28 DIAGNOSIS — F331 Major depressive disorder, recurrent, moderate: Secondary | ICD-10-CM
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
## 2012-06-04 ENCOUNTER — Ambulatory Visit (HOSPITAL_BASED_OUTPATIENT_CLINIC_OR_DEPARTMENT_OTHER): Payer: Medicare Other | Admitting: Marriage and Family Therapist

## 2012-06-04 DIAGNOSIS — F410 Panic disorder [episodic paroxysmal anxiety] without agoraphobia: Secondary | ICD-10-CM | POA: Diagnosis not present

## 2012-06-04 DIAGNOSIS — F332 Major depressive disorder, recurrent severe without psychotic features: Secondary | ICD-10-CM | POA: Diagnosis not present
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
## 2012-06-11 ENCOUNTER — Ambulatory Visit (INDEPENDENT_AMBULATORY_CARE_PROVIDER_SITE_OTHER): Payer: Medicare Other | Admitting: Marriage and Family Therapist

## 2012-06-11 DIAGNOSIS — F410 Panic disorder [episodic paroxysmal anxiety] without agoraphobia: Secondary | ICD-10-CM

## 2012-06-11 DIAGNOSIS — F331 Major depressive disorder, recurrent, moderate: Secondary | ICD-10-CM

## 2012-06-11 NOTE — Progress Notes (Signed)
   THERAPIST PROGRESS NOTE  Session Time: 10:00 - 11:00 a.m.  Participation Level: Active  Behavioral Response: CasualAlertAnxious and Depressed  Type of Therapy: Individual Therapy  Treatment Goals addressed: Coping  Interventions: Strength-based and Supportive  Summary: Deziray Nabi is a 42 y.o. female who presents with depression and anxiety.  She was referred by [name], PA.  Patient was very late for her appointment.  She admits that the cab driver continues to be more and more late, causing her problems getting her.  Patient reported she did not know if she "could take anymore stress."  Her biggest stressor right now is that she owes over $900 in rent and has no money.   She did say she is going to church.  She also states her second grade teacher stopped by to tell her she loved her and is thinking of her which helped patient feel better for a short time.  Suicidal/Homicidal: Yeswithout intent/plan  Therapist Response:  Discussed patient not getting her needs met in therapy because of the problems with the cab driver.  Patient states she will talk to him again about the importance of getting her to her appointments on time.  Discussed concerns that patient is overwhelmed and depressed.  Discussed inpatient.  Patient states there is no one to take care of her child.  Assessed for suicide.  Patient is having passive thoughts with no plan e.g., "maybe I will go away or die."   Discussed this writer going on vacation and what patient needs to do if her ability to handle stress gets worse.  Note:  Discussed concerns relating to patient's stressful situation and difficulty getting to appointments with [name], RN.  Plan: Return again in 3 weeks.  Diagnosis: Axis I: Major depressive d/o, moderate; Panic d/o    Axis II:  Deferred    Davidlee Jeanbaptiste, LMFT, CTS 06/11/2012

## 2012-06-17 ENCOUNTER — Ambulatory Visit (HOSPITAL_COMMUNITY): Payer: Self-pay | Admitting: Physician Assistant

## 2012-06-17 ENCOUNTER — Telehealth (HOSPITAL_COMMUNITY): Payer: Self-pay

## 2012-06-18 ENCOUNTER — Telehealth (HOSPITAL_COMMUNITY): Payer: Self-pay | Admitting: *Deleted

## 2012-06-18 DIAGNOSIS — F988 Other specified behavioral and emotional disorders with onset usually occurring in childhood and adolescence: Secondary | ICD-10-CM

## 2012-06-18 DIAGNOSIS — F329 Major depressive disorder, single episode, unspecified: Secondary | ICD-10-CM

## 2012-06-18 DIAGNOSIS — F410 Panic disorder [episodic paroxysmal anxiety] without agoraphobia: Secondary | ICD-10-CM

## 2012-06-18 NOTE — Telephone Encounter (Signed)
Patient left 3 long, rambling Voice Mails regarding herself and her daughter (also pt in this clinic). Summary of VM follows: Could not come to appt 5/21 due to lack of money and transportation. She states she tried to press #2 to cancel appt, but it did not work. Won't go to [place] or [place] or anywhere else because she does not trust them. Really wants provider, [name], PA to contact her as she thinks talking to him would help. States she could just come and sit here in the clinic and talk to him all day. States he is her "lifeline" right now while Mis Dorina Hoyer is gone. Also requests refills of "all" her medicine, especially her Adderall

## 2012-06-23 ENCOUNTER — Telehealth (HOSPITAL_COMMUNITY): Payer: Self-pay | Admitting: Physician Assistant

## 2012-06-23 NOTE — Telephone Encounter (Signed)
Returned patient's call. Patient missed her appointment on 06/17/2012 due to transportation problems. Feel she needs to get in to see me emergently, but has no appointment scheduled. Told patient to call and have appointment scheduled as early as possible.

## 2012-06-25 ENCOUNTER — Other Ambulatory Visit (HOSPITAL_COMMUNITY): Payer: Self-pay | Admitting: *Deleted

## 2012-06-25 NOTE — Telephone Encounter (Signed)
Patient called 5/27 to repeat message left on 5/22. Patient called 5/29 again stating she and daughter (pt in this clinic) need medications. Has appt 6/18

## 2012-06-25 NOTE — Addendum Note (Signed)
Addended by: Tonny Bollman on: 06/25/2012 01:38 PM   Modules accepted: Orders

## 2012-06-26 ENCOUNTER — Other Ambulatory Visit (HOSPITAL_COMMUNITY): Payer: Self-pay | Admitting: Physician Assistant

## 2012-06-26 ENCOUNTER — Telehealth (HOSPITAL_COMMUNITY): Payer: Self-pay

## 2012-06-26 DIAGNOSIS — F329 Major depressive disorder, single episode, unspecified: Secondary | ICD-10-CM

## 2012-06-26 DIAGNOSIS — F410 Panic disorder [episodic paroxysmal anxiety] without agoraphobia: Secondary | ICD-10-CM

## 2012-06-26 DIAGNOSIS — F988 Other specified behavioral and emotional disorders with onset usually occurring in childhood and adolescence: Secondary | ICD-10-CM

## 2012-06-26 MED ORDER — GABAPENTIN 400 MG PO CAPS: 800.0000 mg | ORAL_CAPSULE | Freq: Three times a day (TID) | ORAL | Status: DC

## 2012-06-26 MED ORDER — AMPHETAMINE-DEXTROAMPHETAMINE 10 MG PO TABS: 10.0000 mg | ORAL_TABLET | Freq: Every day | ORAL | Status: DC

## 2012-06-26 MED ORDER — AMPHETAMINE-DEXTROAMPHET ER 25 MG PO CP24: 25.0000 mg | ORAL_CAPSULE | ORAL | Status: DC

## 2012-06-26 MED ORDER — DULOXETINE HCL 60 MG PO CPEP: ORAL_CAPSULE | ORAL | Status: DC

## 2012-06-26 MED ORDER — CLORAZEPATE DIPOTASSIUM 15 MG PO TABS: ORAL_TABLET | ORAL | Status: DC

## 2012-06-29 ENCOUNTER — Telehealth (HOSPITAL_COMMUNITY): Payer: Self-pay

## 2012-06-29 NOTE — Telephone Encounter (Signed)
8:03am 06/29/12 Called cell# - left msg..called hm# s/w pt informed that rx script for ADDERALL XR 25MG  AND ADDERALL 10MG  is ready for pick-up/sh

## 2012-07-06 ENCOUNTER — Ambulatory Visit (INDEPENDENT_AMBULATORY_CARE_PROVIDER_SITE_OTHER): Payer: Medicare Other | Admitting: Physician Assistant

## 2012-07-06 ENCOUNTER — Encounter (HOSPITAL_COMMUNITY): Payer: Self-pay | Admitting: Physician Assistant

## 2012-07-06 VITALS — BP 121/80 | HR 80 | Ht 72.0 in | Wt 223.0 lb

## 2012-07-06 DIAGNOSIS — F331 Major depressive disorder, recurrent, moderate: Secondary | ICD-10-CM | POA: Diagnosis not present

## 2012-07-06 DIAGNOSIS — F988 Other specified behavioral and emotional disorders with onset usually occurring in childhood and adolescence: Secondary | ICD-10-CM

## 2012-07-06 DIAGNOSIS — F410 Panic disorder [episodic paroxysmal anxiety] without agoraphobia: Secondary | ICD-10-CM

## 2012-07-06 MED ORDER — AMPHETAMINE-DEXTROAMPHETAMINE 10 MG PO TABS: 10.0000 mg | ORAL_TABLET | Freq: Every day | ORAL | Status: DC

## 2012-07-06 MED ORDER — AMPHETAMINE-DEXTROAMPHET ER 25 MG PO CP24: 25.0000 mg | ORAL_CAPSULE | ORAL | Status: DC

## 2012-07-06 NOTE — Progress Notes (Signed)
[facility] [id number] Progress Note  Lindsay [name] [id number] 42 y.o.  07/06/2012 1:49 PM  Chief Complaint: Depression, anxiety, inability to focus  History of Present Illness: Lindsay [name] presents today to follow up on her treatment for depression, anxiety, and ADHD. When asked how she is doing she replied "blah." She states that she ran out of her Adderall and Cymbalta for a period of about one week, and has been back on them for about one week now. She reports that she was unable to focus during that time that her medication had run out. She reports that she is extremely overwhelmed with life. She endorses poor sleep only 3-4 hours per night. She also endorses a poor appetite and only eats a full meal about twice monthly. She states that she normally eats crackers and drinks water. She denies any suicidal or homicidal ideation. She denies any auditory or visual hallucinations.  Suicidal Ideation: No Plan Formed: No Patient has means to carry out plan: No  Homicidal Ideation: No Plan Formed: No Patient has means to carry out plan: No  Review of Systems: Psychiatric: Agitation: Yes Hallucination: No Depressed Mood: Yes Insomnia: Yes Hypersomnia: No Altered Concentration: Yes Feels Worthless: Yes Grandiose Ideas: No Belief In Special Powers: No New/Increased Substance Abuse: No Compulsions: No  Neurologic: Headache: No Seizure: No Paresthesias: No  Outpatient Encounter Prescriptions as of 07/06/2012  Medication Sig Dispense Refill  . acetaminophen (TYLENOL) 500 MG tablet Take 1 tablet (500 mg total) by mouth every 6 (six) hours. Every 6 hours x 2 weeks, then use only as needed (DO NOT EXCEED 4 grams/ day), then change to as needed.  80 tablet  0  . amphetamine-dextroamphetamine (ADDERALL XR) 25 MG 24 hr capsule Take 1 capsule (25 mg total) by mouth every morning.  30 capsule  0  . amphetamine-dextroamphetamine (ADDERALL) 10 MG tablet Take 1 tablet (10 mg total) by mouth  daily.  30 tablet  0  . clorazepate (TRANXENE) 15 MG tablet Take 2 (two) tablets in the morning, Take 2 (two) tablets at noon, and Take 1 tablet in the evening  150 tablet  1  . dicyclomine (BENTYL) 20 MG tablet Take 1 tablet (20 mg total) by mouth 4 (four) times daily.  120 tablet  2  . DULoxetine (CYMBALTA) 60 MG capsule TAKE 1 CAPSULE (60 MG TOTAL) BY MOUTH DAILY.  30 capsule  1  . esomeprazole (NEXIUM) 40 MG capsule Take 1 capsule (40 mg total) by mouth daily before breakfast.  30 capsule  2  . fluconazole (DIFLUCAN) 150 MG tablet TAKE 1 TABLET (150 MG TOTAL) BY MOUTH ONCE.  2 tablet  0  . gabapentin (NEURONTIN) 400 MG capsule Take 2 capsules (800 mg total) by mouth 3 (three) times daily.  180 capsule  1  . ibuprofen (ADVIL,MOTRIN) 200 MG tablet Take 200 mg by mouth every 6 (six) hours as needed. PAIN       . omeprazole (PRILOSEC) 20 MG capsule Take 2 capsules (40 mg total) by mouth daily.  60 capsule  5  . promethazine (PHENERGAN) 12.5 MG tablet Take 1 tablet (12.5 mg total) by mouth every 6 (six) hours as needed for nausea.  30 tablet  0  . promethazine (PHENERGAN) 12.5 MG tablet Take 1 tablet (12.5 mg total) by mouth every 6 (six) hours as needed for nausea.  30 tablet  0  . promethazine (PHENERGAN) 12.5 MG tablet Take 1 tablet (12.5 mg total) by mouth every 6 (six) hours as  needed for nausea.  30 tablet  0  . promethazine (PHENERGAN) 25 MG tablet Take 1 tablet (25 mg total) by mouth every 6 (six) hours as needed for nausea.  60 tablet  0  . [DISCONTINUED] amphetamine-dextroamphetamine (ADDERALL XR) 25 MG 24 hr capsule Take 1 capsule (25 mg total) by mouth every morning.  30 capsule  0  . [DISCONTINUED] amphetamine-dextroamphetamine (ADDERALL) 10 MG tablet Take 1 tablet (10 mg total) by mouth daily.  30 tablet  0  . [DISCONTINUED] buPROPion (WELLBUTRIN XL) 150 MG 24 hr tablet TAKE 1 TABLET (150 MG TOTAL) BY MOUTH EVERY MORNING.  30 tablet  3   No facility-administered encounter medications on  file as of 07/06/2012.    Past Psychiatric History/Hospitalization(s): Anxiety: Yes Bipolar Disorder: No Depression: Yes Mania: No Psychosis: No Schizophrenia: No Personality Disorder: No Hospitalization for psychiatric illness: No History of Electroconvulsive Shock Therapy: No Prior Suicide Attempts: No  Physical Exam: Constitutional:  BP 121/80  Pulse 80  Ht 6' (1.829 m)  Wt 223 lb (101.152 kg)  BMI 30.24 kg/m2  General Appearance: alert, oriented, no acute distress, well nourished and casually dressed  Musculoskeletal: Strength & Muscle Tone: within normal limits Gait & Station: normal Patient leans: N/A  Psychiatric: Speech (describe rate, volume, coherence, spontaneity, and abnormalities if any): Coherent, yet somewhat garbled, with a regular rate and rhythm and normal volume  Thought Process (describe rate, content, abstract reasoning, and computation): Somewhat tangential with flight of ideas  Associations: Tangential and Loose  Thoughts: normal  Mental Status: Orientation: oriented to person, place, time/date and situation Mood & Affect: depressed affect Attention Span & Concentration: Intact  Medical Decision Making (Choose Three): Established Problem, Stable/Improving (1), Review of Psycho-Social Stressors (1), Review and summation of old records (2) and Review of Medication Regimen & Side Effects (2)  Assessment: Axis I: Maj. depressive disorder, recurrent, moderate; panic disorder; ADHD, inattentive type  Axis II: Deferred  Axis III: Irritable bowel syndrome, low back pain, GERD  Axis IV: Moderate  Axis V: 60   Plan: We will continue her medications as previously prescribed: Adderall XR 25 mg daily, Adderall 10 mg to be taken daily in the afternoon. We will continue her Cymbalta 60 mg daily, Neurontin 800 mg 3 times daily, and clorazepate 30 mg twice a day and 15 mg at bedtime.  She has been instructed to learn how to separate herself from on  healthy people in her life. She'll return for followup in 2 months.  Lindsay Zent, PA-C 07/06/2012

## 2012-07-09 ENCOUNTER — Ambulatory Visit (INDEPENDENT_AMBULATORY_CARE_PROVIDER_SITE_OTHER): Payer: Medicare Other | Admitting: Marriage and Family Therapist

## 2012-07-09 DIAGNOSIS — F332 Major depressive disorder, recurrent severe without psychotic features: Secondary | ICD-10-CM | POA: Diagnosis not present

## 2012-07-09 DIAGNOSIS — F410 Panic disorder [episodic paroxysmal anxiety] without agoraphobia: Secondary | ICD-10-CM
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
## 2012-07-15 ENCOUNTER — Ambulatory Visit (HOSPITAL_COMMUNITY): Payer: Self-pay | Admitting: Physician Assistant

## 2012-07-23 ENCOUNTER — Ambulatory Visit (INDEPENDENT_AMBULATORY_CARE_PROVIDER_SITE_OTHER): Payer: Medicare Other | Admitting: Marriage and Family Therapist

## 2012-07-23 DIAGNOSIS — F331 Major depressive disorder, recurrent, moderate: Secondary | ICD-10-CM

## 2012-07-23 DIAGNOSIS — F410 Panic disorder [episodic paroxysmal anxiety] without agoraphobia: Secondary | ICD-10-CM
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
## 2012-07-30 ENCOUNTER — Ambulatory Visit (HOSPITAL_COMMUNITY): Payer: Self-pay | Admitting: Marriage and Family Therapist

## 2012-07-30 NOTE — Progress Notes (Unsigned)
   THERAPIST PROGRESS NOTE  Session Time:  9:00 - 10:00 a.m.  Participation Level: DID NOT ATTEND     Behavioral Response:   Type of Therapy: Individual Therapy  Treatment Goals addressed: Coping  Interventions:   Summary: Lindsay [name] is a 42 y.o. female who presents with depression and anxiety.  He was referred by Dr. Lolly Mustache.  PATIENT DID NOT SHOW FOR HER APPOINTMENT.  SHE REPORTED HER CARD SAID 10 AM VERSUS 9 AM.  Suicidal/Homicidal:   Therapist Response:  PATIENT WAS PUT IN AS A NO-SHOW.  CANNOT CHARGE SINCE PATIENT IS ON DISABILITY.  Plan: Return again in 1 weeks.  Diagnosis: Axis I: Major depressive d/o, moderate; Panic d/o    Axis II: Deferred    Alistair Senft, LMFT, CTS 07/30/2012

## 2012-08-06 ENCOUNTER — Ambulatory Visit (INDEPENDENT_AMBULATORY_CARE_PROVIDER_SITE_OTHER): Payer: Medicare Other | Admitting: Marriage and Family Therapist

## 2012-08-06 DIAGNOSIS — F410 Panic disorder [episodic paroxysmal anxiety] without agoraphobia: Secondary | ICD-10-CM | POA: Diagnosis not present

## 2012-08-06 DIAGNOSIS — F331 Major depressive disorder, recurrent, moderate: Secondary | ICD-10-CM | POA: Diagnosis not present

## 2012-08-06 NOTE — Progress Notes (Signed)
   THERAPIST PROGRESS NOTE  Session Time: 10:00 - 11:00 a.m.  Participation Level: Minimal  Behavioral Response: CasualAlertDepressed  Type of Therapy: Individual Therapy  Treatment Goals addressed: Coping  Interventions: Strength-based and Supportive  Summary: Lindsay [name] is a 42 y.o. female who presents with depression and anxiety.  She was referred by [name], PA. Patient was able to get to the session on time because she was able to get a ride.  She reports asking [employer] for a ride and believes she was successful.  Patient did talk about how she was not able to get support from her family, the [facility], [employer], her daughter's guardianship, and others who have not helped her.  She admits her biggest problem right now is financial and that she continues to be unable to keep up with her rent.  She stated, "I know it should be God who should be my biggest problem."  Suicidal/Homicidal: Nowithout intent/plan  Therapist Response:  Discussed patient's problems relating to above.  Also talked about patient's daughter's guardianship's concern that patient was possibly committing fraud to get monies through them.  Patient denied same.  Also discussed patient having "kidney failure" and patient did admit she has some sort of kidney problem.  It was again difficult to get patient pinned down to any subject without her jumping to another subject and had to be redirected several times.  Talked about patient needing more care and suggested patient get involved in an in-home therapy group.  Patient became angry and started crying, stating "nothing ever works," and "are you saying I can't come here?"  Eventually patient stated she no longer was going to come here and to give her the name of an in-home agency, at which point she stopped talking.  This writer decided not to give patient the name due to patient's reaction to same, but rather told her to think about it for a week and decide  what she would like to do.  Reiterated patient needing more help and that the reason for the recommendation was to help her, not reject her or to try to get her out of our agency.  The agency's name is [facility] [place], [phone].  Plan: Return again in 1 weeks.  Diagnosis: Axis I: Major depressive d/o, moderate; Panic d/o    Axis II: Deferred    Lexie Morini, LMFT, CTS 08/06/2012

## 2012-08-17 ENCOUNTER — Telehealth (HOSPITAL_COMMUNITY): Payer: Self-pay | Admitting: *Deleted

## 2012-08-17 NOTE — Telephone Encounter (Signed)
VM left by Chevis Pretty, court appointed Trustee from [employer] - they oversee the [facility] for pt's daughter, Molly Maduro. Ms. Melvyn Novas states they made a home visit, 3hrs on 08/13/12. She is reporting concerns about pt's mother Terre Hanneman to this office because she knows mother is pt here. She reports Ms. Aycock was very disorganized, anxious at times and perseverated on topics. Unable to sequence at times. Of concern to [facility]-- according to Ms. Winebarger, she is unable to get her medicines from this office. Ms.Johns is requesting call back from Ms. Nordmeyer's provider. States she can be reached at [phone], okay to leave message with call back time as she may be out of office.

## 2012-08-20 ENCOUNTER — Inpatient Hospital Stay (HOSPITAL_COMMUNITY)
Admission: AD | Admit: 2012-08-20 | Discharge: 2012-08-21 | DRG: 885 | Disposition: A | Discharge status: Home / Self Care | Payer: Medicare Other | Source: Ambulatory Visit | Attending: Psychiatry | Admitting: Psychiatry

## 2012-08-20 ENCOUNTER — Encounter (HOSPITAL_COMMUNITY): Payer: Self-pay | Admitting: *Deleted

## 2012-08-20 ENCOUNTER — Ambulatory Visit (INDEPENDENT_AMBULATORY_CARE_PROVIDER_SITE_OTHER): Payer: Medicare Other | Admitting: Marriage and Family Therapist

## 2012-08-20 DIAGNOSIS — F410 Panic disorder [episodic paroxysmal anxiety] without agoraphobia: Secondary | ICD-10-CM

## 2012-08-20 DIAGNOSIS — I129 Hypertensive chronic kidney disease with stage 1 through stage 4 chronic kidney disease, or unspecified chronic kidney disease: Secondary | ICD-10-CM | POA: Diagnosis present

## 2012-08-20 DIAGNOSIS — F332 Major depressive disorder, recurrent severe without psychotic features: Secondary | ICD-10-CM

## 2012-08-20 DIAGNOSIS — N189 Chronic kidney disease, unspecified: Secondary | ICD-10-CM | POA: Diagnosis present

## 2012-08-20 DIAGNOSIS — Z79899 Other long term (current) drug therapy: Secondary | ICD-10-CM

## 2012-08-20 DIAGNOSIS — F411 Generalized anxiety disorder: Secondary | ICD-10-CM | POA: Diagnosis not present

## 2012-08-20 DIAGNOSIS — F329 Major depressive disorder, single episode, unspecified: Secondary | ICD-10-CM

## 2012-08-20 HISTORY — DX: Chronic kidney disease, unspecified: N18.9

## 2012-08-20 MED ORDER — HYDROXYZINE HCL 25 MG PO TABS
25.0000 mg | ORAL_TABLET | Freq: Four times a day (QID) | ORAL | Status: DC | PRN
  Administered 2012-08-20: 25 mg via ORAL

## 2012-08-20 MED ORDER — ALUM & MAG HYDROXIDE-SIMETH 200-200-20 MG/5ML PO SUSP: 30.0000 mL | ORAL | Status: DC | PRN

## 2012-08-20 MED ORDER — HYDROXYZINE HCL 50 MG PO TABS
50.0000 mg | ORAL_TABLET | Freq: Every evening | ORAL | Status: DC | PRN
  Administered 2012-08-20: 50 mg via ORAL
  Filled 2012-08-20 (×6): qty 1

## 2012-08-20 MED ORDER — MAGNESIUM HYDROXIDE 400 MG/5ML PO SUSP: 30.0000 mL | Freq: Every day | ORAL | Status: DC | PRN

## 2012-08-20 MED ORDER — PNEUMOCOCCAL VAC POLYVALENT 25 MCG/0.5ML IJ INJ: 0.5000 mL | INJECTION | INTRAMUSCULAR | Status: DC

## 2012-08-20 MED ORDER — NICOTINE 21 MG/24HR TD PT24
21.0000 mg | MEDICATED_PATCH | Freq: Every day | TRANSDERMAL | Status: DC
  Administered 2012-08-20 – 2012-08-21 (×2): 21 mg via TRANSDERMAL
  Filled 2012-08-20 (×5): qty 1

## 2012-08-20 MED ORDER — ACETAMINOPHEN 325 MG PO TABS
650.0000 mg | ORAL_TABLET | Freq: Four times a day (QID) | ORAL | Status: DC | PRN
  Administered 2012-08-20 – 2012-08-21 (×2): 650 mg via ORAL

## 2012-08-20 NOTE — Clinical Social Work Note (Addendum)
Writer aadvised by RN that patient's elderlymother who is elderly, sick, and home alone . RN also advised Clinical research associate there is no running water in th homeWriter asked to contact APS for help.  Writer met with patient who provided Clinical research associate with contact information. Message left on APS voice mail with writer's office number and cell phone number to be called if writer has left for the afternoon.    Writer spoke Nadeen Landau, APS, who took information on patient's mother.  She advised she would take the report but it could take up to 72  Hours for them to send someone out.  She stated she needed patient name due to her being mother's caretaker.  She also asked that writer contact 911 for immediately assistance.  Writer called 911 who advised they will have someone go out and check on patient's mother.  They were given phone number for adult RN station to call back to speak with Charge RN.  Patient's number was not released to 911.
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
## 2012-08-20 NOTE — Progress Notes (Signed)
Patient ID: Lindsay [name], female   DOB: [birth year], 42 y.o.   MRN: [id number] D: Patient denies SI/HI/AVH and pain. Pt seems to have a depressed mood and flat affect.  Pt has been crying in bathroom. Pt reports people talking about her and she feels she been mistreated by staff and roommate. Pt c/o why she is here and does not understand why she is IVC. Pt did not attend evening karaoke group because of increase anxiety. Pt denies any needs or concerns.  Cooperative with assessment. No acute distressed noted at this time.   A: Met with pt 1:1. Medications administered as prescribed. Writer encouraged pt to discuss feelings. Pt encouraged to come to staff with any question or concerns. 15 minutes checks for safety.  R: Patient remains safe. She is complaint with medications and denies any adverse reaction. Continue current POC.

## 2012-08-20 NOTE — Progress Notes (Signed)
   THERAPIST PROGRESS NOTE  Session Time:  10:00 - 11:00 a.m.  Participation Level: Active  Behavioral Response: Bizarre and GuardedConfusedDysphoric, paranoid, speech disorganized and tangential.  Type of Therapy: Individual Therapy  Treatment Goals addressed: Coping  Interventions: Other: implementing having patient admitted  Summary: Lindsay [name] is a 42 y.o. female who presents with depression and anxiety.  She was referred by [name], PA.    This Clinical research associate was contacted by [name] RN  and Dr. Lucianne Muss before patient's session.  Dr. Lucianne Muss reported having an appointment with patient's daughter.  She observed that the patient appeared tangential in speech and disorganized in thought jumping from one sentence, not finishing the sentence and then talking about another subject.  She observed her pulling her hair out and picking at her skin.  Dr. Lucianne Muss stated patient needed to be admitted inpatient.  [name] was notified and this Clinical research associate and [name] met with patient to discuss her behavior and to tell her she was being admitted.  Patient stated she would not go and was concerned about who would take care of her child.  Patient cried throughout continuing to jump from one sentence to another and focusing in on "not pulling her hair out in front of Dr. Lucianne Muss."  It took over an hour to help patient get too assessment.  She spoke to her daughter before taking her.   Suicidal/Homicidal: Nowithout intent/plan  Therapist Response:  This Clinical research associate worked with patient to help her understand why she was being admitted including weeks of concern in sessions about patient's behavior.  Explained some of what she could expect inpatient including medication assessment, group attendance, support, that she would be able to have visitors and talk on the phone and average length of time.  Discussed how to help mother find a place for the daughter to stay while mother is inpatient with [name] and the daughter  called her "big sister" who said she could stay with her.  Mother was present for this conversation.    Plan: Return again after discharge.  Diagnosis: Axis I:  Major depressive disorder, severe with disorganized thinking, Panic d/o    Axis II: Deferred    Cliffie Gingras, LMFT, CTS 08/20/2012

## 2012-08-20 NOTE — BHH Group Notes (Signed)
[facility] LCSW Group Therapy  [facility] 1:15 - 2:30 PM  08/20/2012   Type of Therapy:  Group Therapy  Wynn Banker 08/20/2012

## 2012-08-20 NOTE — Tx Team (Signed)
Initial Interdisciplinary Treatment Plan  PATIENT STRENGTHS: (choose at least two) Average or above average intelligence Capable of independent living Religious Affiliation  PATIENT STRESSORS: Financial difficulties Health problems Occupational concerns   PROBLEM LIST: Problem List/Patient Goals Date to be addressed Date deferred Reason deferred Estimated date of resolution  Depression with psychotic features 20 Aug 2012                                                      DISCHARGE CRITERIA:  Ability to meet basic life and health needs Adequate post-discharge living arrangements Improved stabilization in mood, thinking, and/or behavior Medical problems require only outpatient monitoring Motivation to continue treatment in a less acute level of care  PRELIMINARY DISCHARGE PLAN: Outpatient therapy Participate in family therapy  PATIENT/FAMIILY INVOLVEMENT: This treatment plan has been presented to and reviewed with the patient, Lindsay [name], and/or family member.  The patient and family have been given the opportunity to ask questions and make suggestions.  [name] 08/20/2012, 6:44 PM

## 2012-08-21 DIAGNOSIS — F332 Major depressive disorder, recurrent severe without psychotic features: Principal | ICD-10-CM

## 2012-08-21 DIAGNOSIS — F411 Generalized anxiety disorder: Secondary | ICD-10-CM

## 2012-08-21 LAB — CBC
HCT: 49.6 % — ABNORMAL HIGH (ref 36.0–46.0)
MCH: 29.4 pg (ref 26.0–34.0)
MCV: 87.3 fL (ref 78.0–100.0)
Platelets: 260 10*3/uL (ref 150–400)
RBC: 5.68 MIL/uL — ABNORMAL HIGH (ref 3.87–5.11)

## 2012-08-21 LAB — COMPREHENSIVE METABOLIC PANEL
BUN: 7 mg/dL (ref 6–23)
CO2: 29 mEq/L (ref 19–32)
Calcium: 9.1 mg/dL (ref 8.4–10.5)
Creatinine, Ser: 0.78 mg/dL (ref 0.50–1.10)
GFR calc Af Amer: >90 mL/min (ref >90)
GFR calc non Af Amer: >90 mL/min (ref >90)
Glucose, Bld: 97 mg/dL (ref 70–99)
Total Bilirubin: 1 mg/dL (ref 0.3–1.2)

## 2012-08-21 LAB — TSH: TSH: 1.84 u[IU]/mL (ref 0.350–4.500)

## 2012-08-21 LAB — T4, FREE: Free T4: 0.84 ng/dL (ref 0.80–1.80)

## 2012-08-21 MED ORDER — GABAPENTIN 400 MG PO CAPS
800.0000 mg | ORAL_CAPSULE | Freq: Two times a day (BID) | ORAL | Status: DC
  Administered 2012-08-21: 800 mg via ORAL
  Filled 2012-08-21 (×4): qty 2

## 2012-08-21 MED ORDER — CLORAZEPATE DIPOTASSIUM 15 MG PO TABS: 15.0000 mg | ORAL_TABLET | Freq: Three times a day (TID) | ORAL | Status: DC

## 2012-08-21 MED ORDER — ACETAMINOPHEN 500 MG PO TABS
500.0000 mg | ORAL_TABLET | Freq: Four times a day (QID) | ORAL | Status: DC
  Filled 2012-08-21 (×9): qty 1

## 2012-08-21 MED ORDER — ACETAMINOPHEN 500 MG PO TABS
1000.0000 mg | ORAL_TABLET | Freq: Four times a day (QID) | ORAL | Status: DC
  Filled 2012-08-21 (×4): qty 2

## 2012-08-21 MED ORDER — GABAPENTIN 400 MG PO CAPS: 800.0000 mg | ORAL_CAPSULE | Freq: Two times a day (BID) | ORAL | Status: DC

## 2012-08-21 MED ORDER — IBUPROFEN 800 MG PO TABS: 800.0000 mg | ORAL_TABLET | Freq: Four times a day (QID) | ORAL | Status: DC | PRN

## 2012-08-21 NOTE — Progress Notes (Signed)
BHH Group Notes:  (Nursing/MHT/Case Management/Adjunct)  Date:  08/21/2012  Time:  6:34 PM  Type of Therapy:  Therapeutic Activity  Participation Level:  Minimal  Participation Quality:  Attentive  Affect:  Appropriate  Cognitive:  Appropriate  Insight:  Good  Engagement in Group:  Developing/Improving  Modes of Intervention:  Activity, Discussion, Exploration and Socialization  Summary of Progress/Problems: Antonae attended and participated in coping skills pictionary, where one person in the group comes up and chooses a coping skill and draws a picture on the board and patient guesses what the picture is. Patients learned how communication was a big part of the game and how you interpret and share information with others to get your point across.    Karleen Hampshire Brittini 08/21/2012, 6:34 PM

## 2012-08-21 NOTE — BHH Suicide Risk Assessment (Signed)
Suicide Risk Assessment  Discharge Assessment     Demographic Factors:  Adolescent or young adult, Low socioeconomic status and Unemployed  Mental Status Per Nursing Assessment::   On Admission:  NA  Current Mental Status by Physician: Patient is calm, and cooperative. she has normal speech and thought process. she has sad mood and dysphoric affect when asked to stay for medication stabilization. she is worried about loosing her benefits because of staying in hospital but denied SI/HI and has no evidence of psychosis.   Loss Factors: Financial problems/change in socioeconomic status  Historical Factors: Family history of mental illness or substance abuse  Risk Reduction Factors:   Responsible for children under 40 years of age, Sense of responsibility to family, Religious beliefs about death, Living with another person, especially a relative, Positive social support, Positive therapeutic relationship and Positive coping skills or problem solving skills  Continued Clinical Symptoms:  Severe Anxiety and/or Agitation Depression:   Recent sense of peace/wellbeing Severe Previous Psychiatric Diagnoses and Treatments Medical Diagnoses and Treatments/Surgeries  Cognitive Features That Contribute To Risk:  Loss of executive function    Suicide Risk:  Minimal: No identifiable suicidal ideation.  Patients presenting with no risk factors but with morbid ruminations; may be classified as minimal risk based on the severity of the depressive symptoms  Discharge Diagnoses:   AXIS I:  Generalized Anxiety Disorder and Major Depression, Recurrent severe AXIS II:  Deferred AXIS III:   Past Medical History  Diagnosis Date  . Depression with anxiety   . Hypertension   . Anorexia   . IBS (irritable bowel syndrome)   . Low back pain   . Edema leg   . Hemorrhoids   . Panic attacks   . Anemia     Iron Def  . Colon polyp 2009  . Migraines   . Colon polyps   . Neuromuscular disorder    fibromyalgia  . Tonsil pain   . Polyp of vocal cord or larynx   . Hidradenitis suppurativa   . Depression   . Chronic kidney disease     Nephrotic syndrome   AXIS IV:  economic problems, occupational problems, other psychosocial or environmental problems and problems related to social environment AXIS V:  51-60 moderate symptoms  Plan Of Care/Follow-up recommendations:  Activity:  as tolerated Diet:  Regular  Is patient on multiple antipsychotic therapies at discharge:  No   Has Patient had three or more failed trials of antipsychotic monotherapy by history:  No  Recommended Plan for Multiple Antipsychotic Therapies: Not applicable  Illias Pantano,JANARDHAHA R. 08/21/2012, 12:51 PM

## 2012-08-21 NOTE — BHH Group Notes (Signed)
[facility] LCSW Aftercare Discharge Planning Group Note   08/21/2012 8:45 AM  Participation Quality:  Alert and Appropriate   Mood/Affect:  Appropriate, Labile, Tearful  Depression Rating:  5  Anxiety Rating:  5  Thoughts of Suicide:  Pt denies SI/HI  Will you contract for safety?   Yes  Current AVH:  No  Plan for Discharge/Comments:  Pt attended discharge planning group and actively participated in group.  CSW provided pt with today's workbook.  Pt states that she was downstairs in outpatient for her daughter's appointment and the doctor committed her; pt reports not knowing what she did to have to come inpatient.  Pt was tearful, stating she is stressed out about her recent eviction notice and finances.  Pt reports having outpatient providers with [facility].  CSW will confirm pt's follow up appointments.  No further needs voiced by pt at this time.    Transportation Means: Pt reports access to transportation  Supports: No supports mentioned at this time  Lindsay [name], LCSWA 08/21/2012 10:06 AM
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
## 2012-08-21 NOTE — Progress Notes (Signed)
[facility] Adult Case Management Discharge Plan :  Will you be returning to the same living situation after discharge: Yes,  returning home At discharge, do you have transportation home?:Yes,  access to transportation Do you have the ability to pay for your medications:Yes,  access to meds  Release of information consent forms completed and in the chart;  Patient's signature needed at discharge.  Patient to Follow up at: Follow-up Information   Follow up with [facility] On 08/31/2012. (Appointment scheduled at 1:30 pm with [name], PA for medication management)    Contact information:   [address] [place], [place] [address] [phone]      Follow up with [facility] On 09/03/2012. (Appointment scheduled at 10:00 am with Cleophas Dunker for therapy)    Contact information:   [address] [place], [place] [address] [phone]      Patient denies SI/HI:   Yes,  denies SI/HI    Safety Planning and Suicide Prevention discussed:  Yes,  discussed with pt.  N/A to contact family/friend.  See suicide prevention note.   Carmina Miller 08/21/2012, 11:24 AM

## 2012-08-21 NOTE — Discharge Summary (Signed)
Physician Discharge Summary Note  Patient:  Lindsay [name] is an 42 y.o., female MRN:  [id number] DOB:  04/25/1970 Patient phone:  [phone] (home)  Patient address:   [address] [place] [address],   Date of Admission:  08/20/2012 Date of Discharge: 08/21/2012  Reason for Admission:  disorganized  Discharge Diagnoses: Active Problems:   * No active hospital problems. *  Review of Systems  Constitutional: Negative.  Negative for fever, chills, weight loss, malaise/fatigue and diaphoresis.  HENT: Negative for congestion and sore throat.   Eyes: Negative for blurred vision, double vision and photophobia.  Respiratory: Negative for cough, shortness of breath and wheezing.   Cardiovascular: Negative for chest pain, palpitations and PND.  Gastrointestinal: Negative for heartburn, nausea, vomiting, abdominal pain, diarrhea and constipation.  Musculoskeletal: Negative for myalgias, joint pain and falls.  Neurological: Negative for dizziness, tingling, tremors, sensory change, speech change, focal weakness, seizures, loss of consciousness, weakness and headaches.  Endo/Heme/Allergies: Negative for polydipsia. Does not bruise/bleed easily.  Psychiatric/Behavioral: Negative for depression, suicidal ideas, hallucinations, memory loss and substance abuse. The patient is not nervous/anxious and does not have insomnia.   Discharge Diagnoses:  AXIS I: Generalized Anxiety Disorder and Major Depression, Recurrent severe  AXIS II: Deferred  AXIS III:  Past Medical History   Diagnosis  Date   .  Depression with anxiety    .  Hypertension    .  Anorexia    .  IBS (irritable bowel syndrome)    .  Low back pain    .  Edema leg    .  Hemorrhoids    .  Panic attacks    .  Anemia      Iron Def   .  Colon polyp  2009   .  Migraines    .  Colon polyps    .  Neuromuscular disorder      fibromyalgia   .  Tonsil pain    .  Polyp of vocal cord or larynx    .  Hidradenitis suppurativa    .   Depression    .  Chronic kidney disease      Nephrotic syndrome    AXIS IV: economic problems, occupational problems, other psychosocial or environmental problems and problems related to social environment  AXIS V: 51-60 moderate symptoms     Level of Care:  OP  Hospital Course:  Patient was admitted but did not meet requirement for acute psychiatric hospitalization. The patient was discharged home with plans to follow up with the out patient program as noted below.  Consults:  None  Significant Diagnostic Studies:  None  Discharge Vitals:   Blood pressure 126/85, pulse 101, temperature 98 F (36.7 C), temperature source Oral, resp. rate 20, height 6' (1.829 m), weight 93.441 kg (206 lb), last menstrual period 08/19/2012. Body mass index is 27.93 kg/(m^2). Lab Results:   Results for orders placed during the hospital encounter of 08/20/12 (from the past 72 hour(s))  CBC     Status: Abnormal   Collection Time    08/21/12  6:49 AM      Result Value Range   WBC 5.9  4.0 - 10.5 K/uL   RBC 5.68 (*) 3.87 - 5.11 MIL/uL   Hemoglobin 16.7 (*) 12.0 - 15.0 g/dL   HCT 69.6 (*) 29.5 - 28.4 %   MCV 87.3  78.0 - 100.0 fL   MCH 29.4  26.0 - 34.0 pg   MCHC 33.7  30.0 -  36.0 g/dL   RDW 13.0 (*) 86.5 - 78.4 %   Platelets 260  150 - 400 K/uL  COMPREHENSIVE METABOLIC PANEL     Status: None   Collection Time    08/21/12  6:49 AM      Result Value Range   Sodium 139  135 - 145 mEq/L   Potassium 3.6  3.5 - 5.1 mEq/L   Chloride 103  96 - 112 mEq/L   CO2 29  19 - 32 mEq/L   Glucose, Bld 97  70 - 99 mg/dL   BUN 7  6 - 23 mg/dL   Creatinine, Ser 6.96  0.50 - 1.10 mg/dL   Calcium 9.1  8.4 - 29.5 mg/dL   Total Protein 7.0  6.0 - 8.3 g/dL   Albumin 3.7  3.5 - 5.2 g/dL   AST 13  0 - 37 U/L   ALT 12  0 - 35 U/L   Alkaline Phosphatase 56  39 - 117 U/L   Total Bilirubin 1.0  0.3 - 1.2 mg/dL   GFR calc non Af Amer >90  >90 mL/min   GFR calc Af Amer >90  >90 mL/min   Comment:            The eGFR  has been calculated     using the CKD EPI equation.     This calculation has not been     validated in all clinical     situations.     eGFR's persistently     <90 mL/min signify     possible Chronic Kidney Disease.  T4, FREE     Status: None   Collection Time    08/21/12  6:49 AM      Result Value Range   Free T4 0.84  0.80 - 1.80 ng/dL  TSH     Status: None   Collection Time    08/21/12  6:49 AM      Result Value Range   TSH 1.840  0.350 - 4.500 uIU/mL    Physical Findings: AIMS: Facial and Oral Movements Muscles of Facial Expression: None, normal Lips and Perioral Area: None, normal Jaw: None, normal Tongue: None, normal,Extremity Movements Upper (arms, wrists, hands, fingers): None, normal Lower (legs, knees, ankles, toes): None, normal, Trunk Movements Neck, shoulders, hips: None, normal, Overall Severity Severity of abnormal movements (highest score from questions above): None, normal Incapacitation due to abnormal movements: None, normal Patient's awareness of abnormal movements (rate only patient's report): No Awareness, Dental Status Current problems with teeth and/or dentures?: No Does patient usually wear dentures?: No  CIWA:    COWS:     Psychiatric Specialty Exam: See Psychiatric Specialty Exam and Suicide Risk Assessment completed by Attending Physician prior to discharge.  Discharge destination:  Home  Is patient on multiple antipsychotic therapies at discharge:  No   Has Patient had three or more failed trials of antipsychotic monotherapy by history:  No  Recommended Plan for Multiple Antipsychotic Therapies:Not applicable   Discharge Orders   Future Appointments Provider Department Dept Phone   09/03/2012 10:00 AM Cleophas Dunker, LMFT [facility] OUT[facility] THER[facility] [place] [phone]   09/10/2012 10:00 AM Cleophas Dunker, LMFT [facility] OUTPATIENT THERAPY [place] [phone]   09/24/2012 11:00 AM Cleophas Dunker, LMFT BE[facility]  OUTPATIENT THERAPY [place] [phone]   10/01/2012 10:00 AM Cleophas Dunker, LMFT [facility] OUTPATIENT THERAPY  [phone]   Future Orders Complete By Expires     Diet - low sodium heart healthy  As  directed     Discharge instructions  As directed     Comments:      Take your medications as prescribed by your provider. Keep all follow up appointments as scheduled.  We have not prescribed any medications for you at this time.    Increase activity slowly  As directed         Medication List    STOP taking these medications       acetaminophen 500 MG tablet  Commonly known as:  TYLENOL     amphetamine-dextroamphetamine 10 MG tablet  Commonly known as:  ADDERALL     amphetamine-dextroamphetamine 25 MG 24 hr capsule  Commonly known as:  ADDERALL XR     DULoxetine 60 MG capsule  Commonly known as:  CYMBALTA     ibuprofen 200 MG tablet  Commonly known as:  ADVIL,MOTRIN      TAKE these medications     Indication   clorazepate 15 MG tablet  Commonly known as:  TRANXENE  Take 1 tablet (15 mg total) by mouth 3 (three) times daily. Take 2 (two) tablets in the morning, Take 2 (two) tablets at noon, and Take 1 tablet in the evening      gabapentin 400 MG capsule  Commonly known as:  NEURONTIN  Take 2 capsules (800 mg total) by mouth 2 (two) times daily.   Indication:  Fibromyalgia Syndrome           Follow-up Information   Follow up with [facility] On 08/31/2012. (Appointment scheduled at 1:30 pm with [name], PA for medication management)    Contact information:   [address] [place], [place] [address] [phone]      Follow up with [facility] On 09/03/2012. (Appointment scheduled at 10:00 am with Cleophas Dunker for therapy)    Contact information:   [address] [place], [place] [address] [phone]      Follow-up recommendations:   Activities: Resume activity as tolerated. Diet: Heart healthy low  sodium diet Tests: Follow up testing will be determined by your out patient provider. Comments:    Total Discharge Time:  Less than 30 minutes.  Signed: MASHBURN,NEIL 08/21/2012, 1:34 PM  Patient was seen and evaluated for suicide risk assessment, formulated discharge plans and case discussed with physician extender.Reviewed the information documented and agree with the treatment plan.  Nehemiah Settle., M.D. 09/01/2012 12:52 PM

## 2012-08-21 NOTE — Progress Notes (Signed)
Adult Psychoeducational Group Note  Date:  08/21/2012 Time:  1:06 PM  Group Topic/Focus:  Relapse Prevention Planning:   The focus of this group is to define relapse and discuss the need for planning to combat relapse.  Participation Level:  None  Participation Quality:  Drowsy and Inattentive  Affect:  Flat  Cognitive:  Oriented  Insight: Lacking and Limited  Engagement in Group:  Pt fell asleep in group.  Modes of Intervention:  Activity, Discussion, Socialization and Support  Additional Comments:  Pt came to group and was engaged in the beginning, but pt fell asleep in group and slept until the end.  Cathlean Cower 08/21/2012, 1:06 PM

## 2012-08-21 NOTE — Progress Notes (Signed)
Patient ID: Lindsay [name], female   DOB: 1970-11-30, 42 y.o.   MRN: [id number]     Psychiatric Admission Note  Patient was admitted from outpatient program for being confused, disorganized am unable to support her family with her multiple psychosocial stressors especially financial problems. Patient has and that he mother and they teenaged daughter with her multiple behavioral and emotional problems. Patient denies suicidal and homicidal ideation disturbed last. She has no evidence of psychotic symptoms.   Mental Status Examination: Patient appeared as per his stated age, casually dressed, and fairly groomed, and maintaining good eye contact. Patient has good mood and his affect was constricted. He has normal rate, rhythm, and volume of speech. His thought process is linear and goal directed. Patient has denied suicidal, homicidal ideations, intentions or plans. Patient has no evidence of auditory or visual hallucinations, delusions, and paranoia. Patient has fair insight judgment and impulse control. Assessment: #1 Maj. depressive disorder recurrent, #2 generalized anxiety disorder  Plan: Patient will be referred to the outpatient psychiatric services as she does not meet criteria for inpatient psychiatric hospitalization as she has normal specific suicidal or homicidal ideations intentions or plans and she has no evidence of psychotic symptoms.

## 2012-08-21 NOTE — Progress Notes (Signed)
Discharge Note: Discharge instructions and bus pass given to patient. Patient verbalized understanding of discharge instructions. Returned belongings to patient. Denies SI/HI/AVH. Patient d/c without incident to the lobby and transported by [employer] city bus.

## 2012-08-21 NOTE — Progress Notes (Signed)
Patient ID: Lindsay [name], female   DOB: October 25, 1970, 42 y.o.   MRN: [id number] D. The patient was resting in bed with eyes closed. C/o that it was too cold in her room.  A. Heat adjusted for the patient in her room. Reassessed for headache pain. R. Resting. Safety maintained.

## 2012-08-21 NOTE — [facility] Suicide Risk Assessment (Signed)
[facility] INPATIENT: Family/Significant Other Suicide Prevention Education  Suicide Prevention Education:  Education Completed; No one has been identified by the patient as the family member/significant other with whom the patient will be residing, and identified as the person(s) who will aid the patient in the event of a mental health crisis (suicidal ideations/suicide attempt). With written consent from the patient, the family member/significant other has been provided the following suicide prevention education, prior to the and/or following the discharge of the patient.  The suicide prevention education provided includes the following:  Suicide risk factors  Suicide prevention and interventions  National Suicide Hotline telephone number  [facility] assessment telephone number  [facility] Emergency Assistance 911  [facility] and/or Residential Mobile Crisis Unit telephone number Request made of family/significant other to:  Remove weapons (e.g., guns, rifles, knives), all items previously/currently identified as safety concern.  Remove drugs/medications (over-the-counter, prescriptions, illicit drugs), all items previously/currently identified as a safety concern. The family member/significant other verbalizes understanding of the suicide prevention education information provided. The family member/significant other agrees to remove the items of safety concern listed above. Pt did not c/o SI at admission, nor have they endorsed SI during their stay here. SPE not required.  Reyes Ivan, LCSWA 08/21/2012  10:05 AM

## 2012-08-21 NOTE — Tx Team (Addendum)
Interdisciplinary Treatment Plan Update (Adult)  Date: 08/21/2012  Time Reviewed:  9:45 AM  Progress in Treatment: Attending groups: Yes Participating in groups:  Yes Taking medication as prescribed:  Yes Tolerating medication:  Yes Family/Significant othe contact made: N/A Patient understands diagnosis:  Yes Discussing patient identified problems/goals with staff:  Yes Medical problems stabilized or resolved:  Yes Denies suicidal/homicidal ideation: Yes Issues/concerns per patient self-inventory:  Yes Other:  New problem(s) identified: N/A  Discharge Plan or Barriers: Pt has follow up scheduled with [facility] for medication management and therapy.    Reason for Continuation of Hospitalization: Stable to d/c  Comments: N/A  Estimated length of stay: D/C today  For review of initial/current patient goals, please see plan of care.  Attendees: Patient:     Family:     Physician:  Dr. Javier Glazier 08/21/2012 10:13 AM   Nursing:   Rowan Blase, RN 08/21/2012 10:13 AM   Clinical Social Worker:  Reyes Ivan, LCSWA 08/21/2012 10:13 AM   Other: Verne Spurr, PA 08/21/2012 10:13 AM   Other:  Alease Frame, RN 08/21/2012 10:13 AM   Other:  Juline Patch, LCSW 08/21/2012 10:13 AM   Other:  Onnie Boer, case manager 08/21/2012 10:13 AM   Other:    Other:    Other:    Other:    Other:    Other:     Scribe for Treatment Team:   Carmina Miller, 08/21/2012 10:13 AM
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
## 2012-08-26 NOTE — Progress Notes (Signed)
Patient Discharge Instructions:  Next Level Care Provider Has Access to the EMR, 08/27/12 Records provided to [facility] via [facility]/Epic access.  Jerelene Redden, 08/26/2012, 3:18 PM

## 2012-08-31 ENCOUNTER — Ambulatory Visit (HOSPITAL_COMMUNITY): Payer: Self-pay | Admitting: Physician Assistant

## 2012-09-03 ENCOUNTER — Ambulatory Visit (INDEPENDENT_AMBULATORY_CARE_PROVIDER_SITE_OTHER): Payer: Medicare Other | Admitting: Marriage and Family Therapist

## 2012-09-03 ENCOUNTER — Other Ambulatory Visit (HOSPITAL_COMMUNITY): Payer: Self-pay | Admitting: *Deleted

## 2012-09-03 DIAGNOSIS — F331 Major depressive disorder, recurrent, moderate: Secondary | ICD-10-CM | POA: Diagnosis not present

## 2012-09-03 DIAGNOSIS — F410 Panic disorder [episodic paroxysmal anxiety] without agoraphobia: Secondary | ICD-10-CM | POA: Diagnosis not present

## 2012-09-03 MED ORDER — CLORAZEPATE DIPOTASSIUM 15 MG PO TABS: ORAL_TABLET | ORAL | Status: DC

## 2012-09-03 MED ORDER — GABAPENTIN 400 MG PO CAPS: 800.0000 mg | ORAL_CAPSULE | Freq: Two times a day (BID) | ORAL | Status: DC
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
## 2012-09-03 NOTE — Telephone Encounter (Signed)
Per provider, refill Tranxene and Gabapentin as Adderall and Cymbalta discontinued when patient discharged from inpatient unit.

## 2012-09-03 NOTE — Telephone Encounter (Signed)
Talked with pt today 09/03/12, while here to see therapist. Informed her that Tranxene and Gabapentin would be refilled through escripts if acceptable, or asked if did pt needed printed prescriptions. Patient states she only needs printed RX for Adderall. Informed pt that per discharge instructions, only Tranxene and Gabapentin were continued,  Adderall and Cymbalta were to be stopped. Patient states she did not receive these instructions at discharge.  Informed her that only Tranxene and Gabapentin would be refilled per provider and other mediation s would be discussed at next appt.

## 2012-09-10 ENCOUNTER — Ambulatory Visit (HOSPITAL_COMMUNITY): Payer: Self-pay | Admitting: Marriage and Family Therapist

## 2012-09-10 ENCOUNTER — Encounter (HOSPITAL_COMMUNITY): Payer: Self-pay

## 2012-09-10 NOTE — Progress Notes (Unsigned)
   THERAPIST PROGRESS NOTE  Session Time:  10:00 - 11:00 a.m.  Participation Level: {BHH PARTICIPATION LEVEL:22264}  Behavioral Response: {Appearance:22683}{BHH LEVEL OF CONSCIOUSNESS:22305}{BHH MOOD:22306}  Type of Therapy: Individual Therapy  Treatment Goals addressed: Coping  Interventions: {CHL AMB BH Type of Intervention:21022753}  Summary: Lindsay [name] is a 42 y.o. female who presents with depression and anxiety.  She was referred by [name], PA.   Suicidal/Homicidal: {BHH YES OR NO:22294}{yes/no/with/without intent/plan:22693}  Therapist Response: ***  Plan: Return again in 1 weeks.  Diagnosis: Axis I: Major depressive d/o, moderate; Panic d/o    Axis II: Deferred    Daryan Buell, LMFT, CTS 09/10/2012

## 2012-09-17 ENCOUNTER — Ambulatory Visit (HOSPITAL_COMMUNITY): Payer: Self-pay | Admitting: Marriage and Family Therapist

## 2012-09-24 ENCOUNTER — Ambulatory Visit (INDEPENDENT_AMBULATORY_CARE_PROVIDER_SITE_OTHER): Payer: Medicare Other | Admitting: Marriage and Family Therapist

## 2012-09-24 DIAGNOSIS — F410 Panic disorder [episodic paroxysmal anxiety] without agoraphobia: Secondary | ICD-10-CM

## 2012-09-24 DIAGNOSIS — F331 Major depressive disorder, recurrent, moderate: Secondary | ICD-10-CM | POA: Diagnosis not present
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
## 2012-09-30 ENCOUNTER — Other Ambulatory Visit: Payer: Self-pay | Admitting: Internal Medicine

## 2012-09-30 ENCOUNTER — Other Ambulatory Visit (HOSPITAL_COMMUNITY): Payer: Self-pay | Admitting: Physician Assistant

## 2012-09-30 NOTE — Telephone Encounter (Signed)
Not seen in well over a year.  If she is having a yeast infection, she needs to be seen or try something OTC.   Thanks

## 2012-09-30 NOTE — Telephone Encounter (Signed)
Will refill X 1, however, she has not been seen in [facility] since 3/13, and never by me.  No further refills until seen either by me (PCP) or in clinic for chronic visit.   Thanks  EBM

## 2012-10-01 ENCOUNTER — Ambulatory Visit (HOSPITAL_COMMUNITY): Payer: Self-pay | Admitting: Marriage and Family Therapist

## 2012-10-05 ENCOUNTER — Ambulatory Visit: Payer: Self-pay | Admitting: Internal Medicine

## 2012-10-06 ENCOUNTER — Encounter: Payer: Self-pay | Admitting: Internal Medicine

## 2012-10-08 ENCOUNTER — Ambulatory Visit (HOSPITAL_COMMUNITY): Payer: Self-pay | Admitting: Marriage and Family Therapist

## 2012-10-13 ENCOUNTER — Ambulatory Visit (INDEPENDENT_AMBULATORY_CARE_PROVIDER_SITE_OTHER): Payer: Medicare Other | Admitting: Physician Assistant

## 2012-10-13 ENCOUNTER — Telehealth (HOSPITAL_COMMUNITY): Payer: Self-pay

## 2012-10-13 ENCOUNTER — Encounter (HOSPITAL_COMMUNITY): Payer: Self-pay | Admitting: Physician Assistant

## 2012-10-13 VITALS — BP 118/89 | HR 81 | Ht 72.0 in | Wt 208.0 lb

## 2012-10-13 DIAGNOSIS — F331 Major depressive disorder, recurrent, moderate: Secondary | ICD-10-CM | POA: Diagnosis not present

## 2012-10-13 DIAGNOSIS — F411 Generalized anxiety disorder: Secondary | ICD-10-CM | POA: Insufficient documentation

## 2012-10-13 MED ORDER — HYDROXYZINE HCL 25 MG PO TABS: 25.0000 mg | ORAL_TABLET | Freq: Every day | ORAL | Status: DC

## 2012-10-13 MED ORDER — GABAPENTIN 400 MG PO CAPS: 800.0000 mg | ORAL_CAPSULE | Freq: Two times a day (BID) | ORAL | Status: DC

## 2012-10-13 MED ORDER — CLORAZEPATE DIPOTASSIUM 15 MG PO TABS: ORAL_TABLET | ORAL | Status: DC

## 2012-10-13 NOTE — Progress Notes (Signed)
[facility] [id number] Progress Note  Lindsay [name] [id number] 42 y.o.  10/13/2012 2:26 PM  Chief Complaint: Depression and insomnia  History of Present Illness: Lindsay [name] presents today to followup on her treatment for anxiety and depression. This is her first clinic visit since she was hospitalized at [facility] for disorganized thought. She reports that the hospitalization was rather traumatic. She missed a previously scheduled appointment on August 4 because of transportation problems. She has been discharged by her therapist and referred to another therapist in the community. She reports that she continues to be very depressed, and is not sleeping well. She states that she goes to bed around 1 or 2 AM, then experiences about one to one and a half hours of sleep latency, and must be up at 6 to get her to our to school. She states that her appetite is a little better than it was the last time she described it. She is cooking her own meals and eating vegetables. She feels that her anxiety is well managed with the Tranxene. She denies any suicidal or homicidal ideation. She denies any auditory or visual hallucinations. She reports that she is going to church 2 times per week.  Suicidal Ideation: No Plan Formed: No Patient has means to carry out plan: No  Homicidal Ideation: No Plan Formed: No Patient has means to carry out plan: No  Review of Systems: Psychiatric: Agitation: No Hallucination: No Depressed Mood: Yes Insomnia: Yes Hypersomnia: No Altered Concentration: No Feels Worthless: No Grandiose Ideas: No Belief In Special Powers: No New/Increased Substance Abuse: No Compulsions: No  Neurologic: Headache: No Seizure: No Paresthesias: No  Past Medical History: Hypertension, low back pain, IBS, migraines, hemorrhoids, chronic kidney disease  Outpatient Encounter Prescriptions as of 10/13/2012  Medication Sig Dispense Refill  . clorazepate (TRANXENE) 15 MG tablet Take 2 (two)  tablets in the morning, Take 2 (two) tablets at noon, and Take 1 tablet in the evening  150 tablet  1  . gabapentin (NEURONTIN) 400 MG capsule Take 2 capsules (800 mg total) by mouth 2 (two) times daily.  120 capsule  1  . hydrOXYzine (ATARAX/VISTARIL) 25 MG tablet Take 1 tablet (25 mg total) by mouth at bedtime.  30 tablet  1  . promethazine (PHENERGAN) 12.5 MG tablet TAKE 1 TABLET (12.5 MG TOTAL) BY MOUTH EVERY 6 (SIX) HOURS AS NEEDED FOR NAUSEA.  30 tablet  0  . [DISCONTINUED] clorazepate (TRANXENE) 15 MG tablet Take 2 (two) tablets in the morning, Take 2 (two) tablets at noon, and Take 1 tablet in the evening  150 tablet  0  . [DISCONTINUED] gabapentin (NEURONTIN) 400 MG capsule Take 2 capsules (800 mg total) by mouth 2 (two) times daily.  120 capsule  0   No facility-administered encounter medications on file as of 10/13/2012.    Past Psychiatric History/Hospitalization(s): Anxiety: Yes Bipolar Disorder: No Depression: Yes Mania: No Psychosis: No Schizophrenia: No Personality Disorder: No Hospitalization for psychiatric illness: Yes History of Electroconvulsive Shock Therapy: No Prior Suicide Attempts: No  Physical Exam: Constitutional:  BP 118/89  Pulse 81  Ht 6' (1.829 m)  Wt 208 lb (94.348 kg)  BMI 28.2 kg/m2  General Appearance: alert, oriented, no acute distress, well nourished and disheveled  Musculoskeletal: Strength & Muscle Tone: within normal limits Gait & Station: normal Patient leans: N/A  Psychiatric: Speech (describe rate, volume, coherence, spontaneity, and abnormalities if any): Clear and coherent a regular rate and rhythm and normal volume  Thought Process (describe rate,  content, abstract reasoning, and computation): Within normal limits  Associations: Disorganized  Thoughts: Illogical  Mental Status: Orientation: oriented to person, place, time/date and situation Mood & Affect: anxiety Attention Span & Concentration: Intact  Medical Decision  Making (Choose Three): Established Problem, Stable/Improving (1), Review of Psycho-Social Stressors (1), Review of Medication Regimen & Side Effects (2) and Review of New Medication or Change in Dosage (2)  Assessment: Axis I: Maj. depressive disorder, recurrent, moderate; generalized anxiety disorder  Axis II: Deferred  Axis III: Hypertension, low back pain, IBS, migraines, hemorrhoids, chronic kidney disease  Axis IV: Severe  Axis V: 50   Plan: She has been given information on, and instructed to contact the [facility] at the [facility] [place] to begin taking classes there. We will start her on trazodone 50 mg at bedtime for sleep, continue her temazepam 30 mg twice daily and 15 mg at bedtime, and Neurontin 800 mg 2 times daily. We will discontinue her Adderall and Cymbalta per her discharge from inpatient hospitalization. She will return for followup in 4 weeks.  Jaire Pinkham, PA-C 10/13/2012

## 2012-10-13 NOTE — Telephone Encounter (Signed)
10/13/12 1:59PM Patient stated that she needs a 48hrs notice for cancels due to transportation - need appt times around 1 - 2pm due to picking child up from school.Lindsay KitchenMarguerite Olea

## 2012-10-22 ENCOUNTER — Ambulatory Visit: Payer: Self-pay | Admitting: Internal Medicine

## 2012-10-22 ENCOUNTER — Encounter: Payer: Self-pay | Admitting: Internal Medicine

## 2012-11-09 ENCOUNTER — Ambulatory Visit (HOSPITAL_COMMUNITY): Payer: Self-pay | Admitting: Physician Assistant

## 2012-11-11 ENCOUNTER — Encounter (HOSPITAL_COMMUNITY): Payer: Self-pay

## 2012-11-24 ENCOUNTER — Encounter (HOSPITAL_COMMUNITY): Payer: Self-pay | Admitting: Psychiatry

## 2012-11-24 ENCOUNTER — Ambulatory Visit (INDEPENDENT_AMBULATORY_CARE_PROVIDER_SITE_OTHER): Payer: Medicare Other | Admitting: Psychiatry

## 2012-11-24 ENCOUNTER — Encounter (INDEPENDENT_AMBULATORY_CARE_PROVIDER_SITE_OTHER): Payer: Self-pay

## 2012-11-24 VITALS — BP 130/88 | HR 76 | Ht 72.0 in | Wt 214.4 lb

## 2012-11-24 DIAGNOSIS — F411 Generalized anxiety disorder: Secondary | ICD-10-CM

## 2012-11-24 DIAGNOSIS — F331 Major depressive disorder, recurrent, moderate: Secondary | ICD-10-CM

## 2012-11-24 MED ORDER — BUPROPION HCL ER (XL) 150 MG PO TB24: 150.0000 mg | ORAL_TABLET | ORAL | Status: DC

## 2012-11-24 MED ORDER — CLORAZEPATE DIPOTASSIUM 15 MG PO TABS: ORAL_TABLET | ORAL | Status: DC

## 2012-11-24 MED ORDER — ARIPIPRAZOLE 2 MG PO TABS: 2.0000 mg | ORAL_TABLET | Freq: Every day | ORAL | Status: DC

## 2012-11-24 MED ORDER — HYDROXYZINE HCL 25 MG PO TABS: 25.0000 mg | ORAL_TABLET | Freq: Every day | ORAL | Status: DC

## 2012-11-24 MED ORDER — GABAPENTIN 400 MG PO CAPS: 800.0000 mg | ORAL_CAPSULE | Freq: Every day | ORAL | Status: DC
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
## 2012-11-25 ENCOUNTER — Ambulatory Visit (HOSPITAL_COMMUNITY): Payer: Self-pay | Admitting: Marriage and Family Therapist

## 2012-12-01 ENCOUNTER — Other Ambulatory Visit (HOSPITAL_COMMUNITY): Payer: Self-pay | Admitting: Physician Assistant

## 2012-12-09 ENCOUNTER — Ambulatory Visit (HOSPITAL_COMMUNITY): Payer: Self-pay | Admitting: Marriage and Family Therapist

## 2012-12-22 ENCOUNTER — Encounter (HOSPITAL_COMMUNITY): Payer: Self-pay | Admitting: Psychiatry

## 2012-12-22 ENCOUNTER — Ambulatory Visit (INDEPENDENT_AMBULATORY_CARE_PROVIDER_SITE_OTHER): Payer: Medicare Other | Admitting: Psychiatry

## 2012-12-22 VITALS — BP 132/81 | HR 73 | Ht 72.0 in | Wt 224.0 lb

## 2012-12-22 DIAGNOSIS — F411 Generalized anxiety disorder: Secondary | ICD-10-CM | POA: Diagnosis not present

## 2012-12-22 DIAGNOSIS — F331 Major depressive disorder, recurrent, moderate: Secondary | ICD-10-CM

## 2012-12-22 MED ORDER — CLORAZEPATE DIPOTASSIUM 15 MG PO TABS: ORAL_TABLET | ORAL | Status: DC

## 2012-12-22 MED ORDER — GABAPENTIN 400 MG PO CAPS: 800.0000 mg | ORAL_CAPSULE | Freq: Every day | ORAL | Status: DC

## 2012-12-22 MED ORDER — ARIPIPRAZOLE 2 MG PO TABS: 2.0000 mg | ORAL_TABLET | Freq: Every day | ORAL | Status: DC

## 2012-12-22 MED ORDER — BUPROPION HCL ER (XL) 300 MG PO TB24: 300.0000 mg | ORAL_TABLET | ORAL | Status: DC
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
## 2012-12-30 ENCOUNTER — Other Ambulatory Visit (HOSPITAL_COMMUNITY): Payer: Self-pay | Admitting: Psychiatry

## 2012-12-30 DIAGNOSIS — F331 Major depressive disorder, recurrent, moderate: Secondary | ICD-10-CM

## 2013-01-01 ENCOUNTER — Other Ambulatory Visit (HOSPITAL_COMMUNITY): Payer: Self-pay | Admitting: Psychiatry

## 2013-03-01 ENCOUNTER — Encounter (HOSPITAL_COMMUNITY): Payer: Self-pay | Admitting: Psychiatry

## 2013-03-01 ENCOUNTER — Ambulatory Visit (INDEPENDENT_AMBULATORY_CARE_PROVIDER_SITE_OTHER): Payer: Medicare Other | Admitting: Psychiatry

## 2013-03-01 VITALS — BP 117/85 | HR 71 | Ht 72.0 in | Wt 242.0 lb

## 2013-03-01 DIAGNOSIS — F331 Major depressive disorder, recurrent, moderate: Secondary | ICD-10-CM

## 2013-03-01 MED ORDER — GABAPENTIN 400 MG PO CAPS: 800.0000 mg | ORAL_CAPSULE | Freq: Every day | ORAL | Status: DC

## 2013-03-01 MED ORDER — BUPROPION HCL ER (XL) 300 MG PO TB24: 300.0000 mg | ORAL_TABLET | ORAL | Status: DC

## 2013-03-01 MED ORDER — CLORAZEPATE DIPOTASSIUM 15 MG PO TABS: ORAL_TABLET | ORAL | Status: DC

## 2013-03-01 MED ORDER — HYDROXYZINE HCL 25 MG PO TABS: ORAL_TABLET | ORAL | Status: DC

## 2013-03-01 MED ORDER — ARIPIPRAZOLE 5 MG PO TABS: 5.0000 mg | ORAL_TABLET | Freq: Every day | ORAL | Status: DC
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
## 2013-03-03 ENCOUNTER — Emergency Department (HOSPITAL_COMMUNITY)
Admission: EM | Admit: 2013-03-03 | Discharge: 2013-03-03 | Disposition: A | Discharge status: Home / Self Care | Payer: Medicare Other | Attending: Emergency Medicine | Admitting: Emergency Medicine

## 2013-03-03 ENCOUNTER — Encounter (HOSPITAL_COMMUNITY): Payer: Self-pay | Admitting: Emergency Medicine

## 2013-03-03 DIAGNOSIS — N189 Chronic kidney disease, unspecified: Secondary | ICD-10-CM | POA: Insufficient documentation

## 2013-03-03 DIAGNOSIS — Z862 Personal history of diseases of the blood and blood-forming organs and certain disorders involving the immune mechanism: Secondary | ICD-10-CM | POA: Diagnosis not present

## 2013-03-03 DIAGNOSIS — Z8719 Personal history of other diseases of the digestive system: Secondary | ICD-10-CM | POA: Diagnosis not present

## 2013-03-03 DIAGNOSIS — G43909 Migraine, unspecified, not intractable, without status migrainosus: Secondary | ICD-10-CM | POA: Diagnosis not present

## 2013-03-03 DIAGNOSIS — L03317 Cellulitis of buttock: Principal | ICD-10-CM

## 2013-03-03 DIAGNOSIS — L0231 Cutaneous abscess of buttock: Secondary | ICD-10-CM | POA: Diagnosis not present

## 2013-03-03 DIAGNOSIS — F410 Panic disorder [episodic paroxysmal anxiety] without agoraphobia: Secondary | ICD-10-CM | POA: Insufficient documentation

## 2013-03-03 DIAGNOSIS — I129 Hypertensive chronic kidney disease with stage 1 through stage 4 chronic kidney disease, or unspecified chronic kidney disease: Secondary | ICD-10-CM | POA: Diagnosis not present

## 2013-03-03 DIAGNOSIS — Z860100 Personal history of colon polyps, unspecified: Secondary | ICD-10-CM | POA: Insufficient documentation

## 2013-03-03 DIAGNOSIS — Z79899 Other long term (current) drug therapy: Secondary | ICD-10-CM | POA: Insufficient documentation

## 2013-03-03 DIAGNOSIS — I10 Essential (primary) hypertension: Secondary | ICD-10-CM | POA: Diagnosis not present

## 2013-03-03 DIAGNOSIS — F17200 Nicotine dependence, unspecified, uncomplicated: Secondary | ICD-10-CM | POA: Diagnosis not present

## 2013-03-03 DIAGNOSIS — R590 Localized enlarged lymph nodes: Secondary | ICD-10-CM

## 2013-03-03 DIAGNOSIS — F341 Dysthymic disorder: Secondary | ICD-10-CM | POA: Diagnosis not present

## 2013-03-03 DIAGNOSIS — Z8709 Personal history of other diseases of the respiratory system: Secondary | ICD-10-CM | POA: Insufficient documentation

## 2013-03-03 DIAGNOSIS — R599 Enlarged lymph nodes, unspecified: Secondary | ICD-10-CM | POA: Insufficient documentation

## 2013-03-03 MED ORDER — ONDANSETRON 4 MG PO TBDP
4.0000 mg | ORAL_TABLET | Freq: Once | ORAL | Status: AC
  Administered 2013-03-03: 4 mg via ORAL
  Filled 2013-03-03: qty 1

## 2013-03-03 MED ORDER — HYDROCODONE-ACETAMINOPHEN 5-325 MG PO TABS
1.0000 | ORAL_TABLET | Freq: Once | ORAL | Status: AC
  Administered 2013-03-03: 1 via ORAL
  Filled 2013-03-03: qty 1

## 2013-03-03 MED ORDER — HYDROCODONE-ACETAMINOPHEN 5-325 MG PO TABS: 2.0000 | ORAL_TABLET | ORAL | Status: DC | PRN

## 2013-03-03 NOTE — ED Provider Notes (Signed)
CSN: [id number]     Arrival date & time 03/03/13  1204 History  This chart was scribed for non-physician practitioner Wille Glaser, PA-C working with Wandra Arthurs, MD by Anastasia Pall, ED scribe. This patient was seen in room [place] and the patient's care was started at 12:53 PM.   Chief Complaint  Patient presents with  . Abscess   (Consider location/radiation/quality/duration/timing/severity/associated sxs/prior Treatment) The history is provided by the patient. No language interpreter was used.   HPI Comments: Lindsay [name] is a 43 y.o. female who presents to the Emergency Department complaining of a gradually worsening, hard abscess over her right buttocks, with drainage and constant pain to the area, onset several years ago. She denies any abscess and pain in the rectal area. She also reports small abscesses over her groin/genital area, but denies drainage. She reports a h/o similar abscesses. She states she normally sees Dr. Barkley Bruns for her abscesses, having h/o axilla abscesses and sweat gland removal. She reports drainage from anus due to h/o IBS. She denies fever, and any other associated symptoms.   PCP - Gilles Chiquito, MD  Past Medical History  Diagnosis Date  . Depression with anxiety   . Hypertension   . Anorexia   . IBS (irritable bowel syndrome)   . Low back pain   . Edema leg   . Hemorrhoids   . Panic attacks   . Anemia     Iron Def  . Colon polyp 2009  . Migraines   . Colon polyps   . Neuromuscular disorder     fibromyalgia  . Tonsil pain   . Polyp of vocal cord or larynx   . Hidradenitis suppurativa   . Depression   . Chronic kidney disease     Nephrotic syndrome   Past Surgical History  Procedure Laterality Date  . Hemorrhoid surgery      with Hidradenitis surgery   . Upper gastrointestinal endoscopy    . Axillary hidradenitis excision    . Inguinal hidradenitis excision    . Tonsillectomy  10/18/2010    by Dr. Wilburn Cornelia   Family History   Problem Relation Age of Onset  . Colon cancer Neg Hx   . Diabetes Mother   . Heart disease Mother     valve leak  . Cancer Father     prostate  . Heart disease Father    History  Substance Use Topics  . Smoking status: Current Every Day Smoker -- 1.50 packs/day for 25 years    Types: Cigarettes  . Smokeless tobacco: Never Used     Comment: 10 cigarette a day  . Alcohol Use: No     Comment: Occasional   OB History   Grav Para Term Preterm Abortions TAB SAB Ect Mult Living                 Review of Systems  All other systems reviewed and are negative.    Allergies  Review of patient's allergies indicates no known allergies.  Home Medications   Current Outpatient Rx  Name  Route  Sig  Dispense  Refill  . ARIPiprazole (ABILIFY) 5 MG tablet   Oral   Take 5 mg by mouth every morning.         . Ascorbic Acid (VITAMIN C PO)   Oral   Take 1 tablet by mouth daily as needed (For Vitamin C supplement.).          [name] buPROPion (WELLBUTRIN XL) 300 MG 24  hr tablet   Oral   Take 1 tablet (300 mg total) by mouth every morning.   30 tablet   1   . clorazepate (TRANXENE) 15 MG tablet   Oral   Take 15 mg by mouth every morning.         . gabapentin (NEURONTIN) 400 MG capsule   Oral   Take 2 capsules (800 mg total) by mouth daily at 8 pm.   60 capsule   1   . hydrOXYzine (ATARAX/VISTARIL) 25 MG tablet   Oral   Take 25 mg by mouth at bedtime as needed (For anxiety or sleep.).          [name] ibuprofen (ADVIL,MOTRIN) 200 MG tablet   Oral   Take 800 mg by mouth every 6 (six) hours as needed (For pain.).          BP 126/74  Pulse 80  Temp(Src) 98.7 F (37.1 C) (Oral)  Resp 18  SpO2 100%  Physical Exam  Nursing note and vitals reviewed. Constitutional: She is oriented to person, place, and time. She appears well-developed and well-nourished. No distress.  HENT:  Head: Normocephalic and atraumatic.  Mouth/Throat: Oropharynx is clear and moist.  Eyes:  Conjunctivae are normal. No scleral icterus.  Pulmonary/Chest: Effort normal.  Musculoskeletal: Normal range of motion. She exhibits no edema and no tenderness.  Lymphadenopathy:       Right: Inguinal adenopathy present.       Left: Inguinal adenopathy present.  Neurological: She is alert and oriented to person, place, and time. She exhibits normal muscle tone. Coordination normal.  Skin: Skin is warm and dry. No rash noted. No pallor.     2 cm area of induration with fluctuance - no surrounding erythema, bilateral groin tenderness with 2cm firm nodules without fluctuance  Psychiatric: She has a normal mood and affect. Her behavior is normal. Judgment and thought content normal.    ED Course  Procedures (including critical care time)  DIAGNOSTIC STUDIES: Oxygen Saturation is 100% on room air, normal by my interpretation.    COORDINATION OF CARE: 12:57 PM-Discussed treatment plan which includes I&D with pt at bedside and pt agreed to plan.   INCISION AND DRAINAGE PROCEDURE NOTE: Patient identification was confirmed and verbal consent was obtained. This procedure was performed by Wille Glaser, PA-C at 1:44 PM. Site: right buttock Sterile procedures observed: yes Needle size: 27 guage Anesthetic used (type and amt): 2% lidocaine without epi Blade size: 11 Drainage: small Complexity: Complex Packing used: none  Patient was not able to tolerate infusion of 93ml of lidocaine, continued to complain that area was not adequately anethetized, though I was able to insert an 11 blade in and obtained small amount of purulent drainage, I attempted to insert curved hemostat to break up loculations but patient was not able to tolerate this and asked that the procedure stop which I did.  Labs Review Labs Reviewed - No data to display Imaging Review No results found.  EKG Interpretation   None      Medications - No data to display  MDM  Right buttock  abscess Lymphadenopathy  Patient here with history of hidradenitis suppurativa who sees Dr. Zella Richer for her abscesses.  She is unable to see him for a week - I attempted to perform I&D of right buttocks abscess was able to get out small amount of drainage but patient was not able to tolerate the procedure.  I have instructed her to do sitz baths, follow  up with Dr. Zella Richer and take the pain medication.  I personally performed the services described in this documentation, which was scribed in my presence. The recorded information has been reviewed and is accurate.   Idalia Needle Joelyn Oms, PA-C 03/03/13 1414

## 2013-03-03 NOTE — ED Notes (Signed)
Pt states she has an abscess on buttock area on panty line. Pt states it stared off small and now has grown and is hard for her to sit down. Pt states she has had them in the past. Pain is 10/10

## 2013-03-03 NOTE — ED Provider Notes (Signed)
Medical screening examination/treatment/procedure(s) were performed by non-physician practitioner and as supervising physician I was immediately available for consultation/collaboration.  EKG Interpretation   None         Wandra Arthurs, MD 03/03/13 [phone]

## 2013-03-03 NOTE — Discharge Instructions (Signed)
Abscess An abscess is an infected area that contains a collection of pus and debris.It can occur in almost any part of the body. An abscess is also known as a furuncle or boil. CAUSES  An abscess occurs when tissue gets infected. This can occur from blockage of oil or sweat glands, infection of hair follicles, or a minor injury to the skin. As the body tries to fight the infection, pus collects in the area and creates pressure under the skin. This pressure causes pain. People with weakened immune systems have difficulty fighting infections and get certain abscesses more often.  SYMPTOMS Usually an abscess develops on the skin and becomes a painful mass that is red, warm, and tender. If the abscess forms under the skin, you may feel a moveable soft area under the skin. Some abscesses break open (rupture) on their own, but most will continue to get worse without care. The infection can spread deeper into the body and eventually into the bloodstream, causing you to feel ill.  DIAGNOSIS  Your caregiver will take your medical history and perform a physical exam. A sample of fluid may also be taken from the abscess to determine what is causing your infection. TREATMENT  Your caregiver may prescribe antibiotic medicines to fight the infection. However, taking antibiotics alone usually does not cure an abscess. Your caregiver may need to make a small cut (incision) in the abscess to drain the pus. In some cases, gauze is packed into the abscess to reduce pain and to continue draining the area. HOME CARE INSTRUCTIONS   Only take over-the-counter or prescription medicines for pain, discomfort, or fever as directed by your caregiver.  If you were prescribed antibiotics, take them as directed. Finish them even if you start to feel better.  If gauze is used, follow your caregiver's directions for changing the gauze.  To avoid spreading the infection:  Keep your draining abscess covered with a  bandage.  Wash your hands well.  Do not share personal care items, towels, or whirlpools with others.  Avoid skin contact with others.  Keep your skin and clothes clean around the abscess.  Keep all follow-up appointments as directed by your caregiver. SEEK MEDICAL CARE IF:   You have increased pain, swelling, redness, fluid drainage, or bleeding.  You have muscle aches, chills, or a general ill feeling.  You have a fever. MAKE SURE YOU:   Understand these instructions.  Will watch your condition.  Will get help right away if you are not doing well or get worse. Document Released: 10/24/2004 Document Revised: 07/16/2011 Document Reviewed: 03/29/2011 [facility] Patient Information 2014 [place].  Lymphadenopathy Lymphadenopathy means "disease of the lymph glands." But the term is usually used to describe swollen or enlarged lymph glands, also called lymph nodes. These are the bean-shaped organs found in many locations including the neck, underarm, and groin. Lymph glands are part of the immune system, which fights infections in your body. Lymphadenopathy can occur in just one area of the body, such as the neck, or it can be generalized, with lymph node enlargement in several areas. The nodes found in the neck are the most common sites of lymphadenopathy. CAUSES  When your immune system responds to germs (such as viruses or bacteria ), infection-fighting cells and fluid build up. This causes the glands to grow in size. This is usually not something to worry about. Sometimes, the glands themselves can become infected and inflamed. This is called lymphadenitis. Enlarged lymph nodes can be  caused by many diseases:  Bacterial disease, such as strep throat or a skin infection.  Viral disease, such as a common cold.  Other germs, such as lyme disease, tuberculosis, or sexually transmitted diseases.  Cancers, such as lymphoma (cancer of the lymphatic system) or leukemia (cancer of  the white blood cells).  Inflammatory diseases such as lupus or rheumatoid arthritis.  Reactions to medications. Many of the diseases above are rare, but important. This is why you should see your caregiver if you have lymphadenopathy. SYMPTOMS   Swollen, enlarged lumps in the neck, back of the head or other locations.  Tenderness.  Warmth or redness of the skin over the lymph nodes.  Fever. DIAGNOSIS  Enlarged lymph nodes are often near the source of infection. They can help healthcare providers diagnose your illness. For instance:   Swollen lymph nodes around the jaw might be caused by an infection in the mouth.  Enlarged glands in the neck often signal a throat infection.  Lymph nodes that are swollen in more than one area often indicate an illness caused by a virus. Your caregiver most likely will know what is causing your lymphadenopathy after listening to your history and examining you. Blood tests, x-rays or other tests may be needed. If the cause of the enlarged lymph node cannot be found, and it does not go away by itself, then a biopsy may be needed. Your caregiver will discuss this with you. TREATMENT  Treatment for your enlarged lymph nodes will depend on the cause. Many times the nodes will shrink to normal size by themselves, with no treatment. Antibiotics or other medicines may be needed for infection. Only take over-the-counter or prescription medicines for pain, discomfort or fever as directed by your caregiver. HOME CARE INSTRUCTIONS  Swollen lymph glands usually return to normal when the underlying medical condition goes away. If they persist, contact your health-care provider. He/she might prescribe antibiotics or other treatments, depending on the diagnosis. Take any medications exactly as prescribed. Keep any follow-up appointments made to check on the condition of your enlarged nodes.  SEEK MEDICAL CARE IF:   Swelling lasts for more than two weeks.  You have  symptoms such as weight loss, night sweats, fatigue or fever that does not go away.  The lymph nodes are hard, seem fixed to the skin or are growing rapidly.  Skin over the lymph nodes is red and inflamed. This could mean there is an infection. SEEK IMMEDIATE MEDICAL CARE IF:   Fluid starts leaking from the area of the enlarged lymph node.  You develop a fever of 102 F (38.9 C) or greater.  Severe pain develops (not necessarily at the site of a large lymph node).  You develop chest pain or shortness of breath.  You develop worsening abdominal pain. MAKE SURE YOU:   Understand these instructions.  Will watch your condition.  Will get help right away if you are not doing well or get worse. Document Released: 10/24/2007 Document Revised: 04/08/2011 Document Reviewed: 10/24/2007 [facility] Patient Information 2014 [place].

## 2013-03-22 ENCOUNTER — Other Ambulatory Visit (HOSPITAL_COMMUNITY): Payer: Self-pay | Admitting: Psychiatry

## 2013-03-22 NOTE — Telephone Encounter (Signed)
Given new script of 5 mg on 03/01/13.

## 2013-04-07 ENCOUNTER — Telehealth (HOSPITAL_COMMUNITY): Payer: Self-pay | Admitting: *Deleted

## 2013-04-07 NOTE — Telephone Encounter (Signed)
Patient left [id number] Dr. Adele Schilder to know she feels anxious and agitated.Cannot focus and not able to remember things.Taking her medicine as directed. Contacted patient:Informed her that MD out of office today and message will be placed in MD "In Basket".

## 2013-04-29 ENCOUNTER — Ambulatory Visit (INDEPENDENT_AMBULATORY_CARE_PROVIDER_SITE_OTHER): Payer: Medicare Other | Admitting: Psychiatry

## 2013-04-29 ENCOUNTER — Encounter (HOSPITAL_COMMUNITY): Payer: Self-pay | Admitting: Psychiatry

## 2013-04-29 VITALS — BP 120/80 | HR 80 | Ht 68.5 in | Wt 255.4 lb

## 2013-04-29 DIAGNOSIS — F411 Generalized anxiety disorder: Secondary | ICD-10-CM | POA: Diagnosis not present

## 2013-04-29 DIAGNOSIS — F331 Major depressive disorder, recurrent, moderate: Secondary | ICD-10-CM | POA: Diagnosis not present

## 2013-04-29 MED ORDER — HYDROXYZINE HCL 25 MG PO TABS: 25.0000 mg | ORAL_TABLET | Freq: Every evening | ORAL | Status: DC | PRN

## 2013-04-29 MED ORDER — ARIPIPRAZOLE 5 MG PO TABS: 5.0000 mg | ORAL_TABLET | Freq: Every morning | ORAL | Status: DC

## 2013-04-29 MED ORDER — CLORAZEPATE DIPOTASSIUM 15 MG PO TABS: 15.0000 mg | ORAL_TABLET | Freq: Every morning | ORAL | Status: DC

## 2013-04-29 MED ORDER — LAMOTRIGINE 25 MG PO TABS: 25.0000 mg | ORAL_TABLET | Freq: Every day | ORAL | Status: DC

## 2013-04-29 MED ORDER — BUPROPION HCL ER (XL) 300 MG PO TB24: 300.0000 mg | ORAL_TABLET | ORAL | Status: DC

## 2013-04-29 MED ORDER — GABAPENTIN 400 MG PO CAPS: 800.0000 mg | ORAL_CAPSULE | Freq: Every day | ORAL | Status: DC
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
## 2013-06-02 ENCOUNTER — Encounter (HOSPITAL_COMMUNITY): Payer: Self-pay | Admitting: Psychiatry

## 2013-06-02 ENCOUNTER — Ambulatory Visit (INDEPENDENT_AMBULATORY_CARE_PROVIDER_SITE_OTHER): Payer: Medicare Other | Admitting: Psychiatry

## 2013-06-02 VITALS — BP 141/89 | HR 80 | Ht 68.0 in | Wt 259.0 lb

## 2013-06-02 DIAGNOSIS — F411 Generalized anxiety disorder: Secondary | ICD-10-CM

## 2013-06-02 DIAGNOSIS — F331 Major depressive disorder, recurrent, moderate: Secondary | ICD-10-CM | POA: Diagnosis not present

## 2013-06-02 MED ORDER — GABAPENTIN 400 MG PO CAPS: 400.0000 mg | ORAL_CAPSULE | Freq: Every day | ORAL | Status: DC
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
## 2013-06-25 ENCOUNTER — Other Ambulatory Visit (HOSPITAL_COMMUNITY): Payer: Self-pay | Admitting: Psychiatry

## 2013-06-25 DIAGNOSIS — F331 Major depressive disorder, recurrent, moderate: Secondary | ICD-10-CM

## 2013-06-30 ENCOUNTER — Other Ambulatory Visit (HOSPITAL_COMMUNITY): Payer: Self-pay | Admitting: Psychiatry

## 2013-07-08 ENCOUNTER — Other Ambulatory Visit (HOSPITAL_COMMUNITY): Payer: Self-pay | Admitting: Psychiatry

## 2013-07-08 DIAGNOSIS — F331 Major depressive disorder, recurrent, moderate: Secondary | ICD-10-CM

## 2013-07-08 MED ORDER — ARIPIPRAZOLE 5 MG PO TABS: 5.0000 mg | ORAL_TABLET | Freq: Every morning | ORAL | Status: DC

## 2013-07-09 ENCOUNTER — Telehealth (HOSPITAL_COMMUNITY): Payer: Self-pay

## 2013-07-12 ENCOUNTER — Other Ambulatory Visit (HOSPITAL_COMMUNITY): Payer: Self-pay | Admitting: Psychiatry

## 2013-07-13 NOTE — Telephone Encounter (Signed)
Returned patient's phone call.  She is complaining of not getting Abilify ample amount because pharmacy does not have enough medication.  I explained that medicine recently got generic and if she can try a different pharmacy then she should let us know and I am happy to transfer her prescription to that pharmacy.  Patient told us she will find out and call us back.

## 2013-07-28 ENCOUNTER — Other Ambulatory Visit (HOSPITAL_COMMUNITY): Payer: Self-pay | Admitting: Psychiatry

## 2013-08-05 DIAGNOSIS — F331 Major depressive disorder, recurrent, moderate: Secondary | ICD-10-CM | POA: Diagnosis not present

## 2013-08-05 DIAGNOSIS — F411 Generalized anxiety disorder: Secondary | ICD-10-CM | POA: Diagnosis not present

## 2013-09-01 ENCOUNTER — Other Ambulatory Visit (HOSPITAL_COMMUNITY): Payer: Self-pay | Admitting: Psychiatry

## 2013-09-02 ENCOUNTER — Encounter (HOSPITAL_COMMUNITY): Payer: Self-pay | Admitting: Psychiatry

## 2013-09-02 ENCOUNTER — Ambulatory Visit (INDEPENDENT_AMBULATORY_CARE_PROVIDER_SITE_OTHER): Payer: Medicare Other | Admitting: Psychiatry

## 2013-09-02 VITALS — BP 131/87 | HR 71 | Wt 267.0 lb

## 2013-09-02 DIAGNOSIS — F331 Major depressive disorder, recurrent, moderate: Secondary | ICD-10-CM | POA: Diagnosis not present

## 2013-09-02 DIAGNOSIS — Z79899 Other long term (current) drug therapy: Secondary | ICD-10-CM

## 2013-09-02 MED ORDER — BUPROPION HCL ER (XL) 300 MG PO TB24: ORAL_TABLET | ORAL | Status: DC

## 2013-09-02 MED ORDER — GABAPENTIN 400 MG PO CAPS
ORAL_CAPSULE | ORAL | Status: DC
Start: 2013-09-02 — End: 2013-11-02

## 2013-09-02 MED ORDER — CLORAZEPATE DIPOTASSIUM 15 MG PO TABS: 15.0000 mg | ORAL_TABLET | Freq: Every morning | ORAL | Status: DC

## 2013-09-02 MED ORDER — ARIPIPRAZOLE 2 MG PO TABS: 2.0000 mg | ORAL_TABLET | Freq: Every morning | ORAL | Status: DC

## 2013-09-02 MED ORDER — HYDROXYZINE HCL 25 MG PO TABS: 25.0000 mg | ORAL_TABLET | Freq: Two times a day (BID) | ORAL | Status: DC
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
## 2013-09-15 DIAGNOSIS — Z79899 Other long term (current) drug therapy: Secondary | ICD-10-CM | POA: Diagnosis not present

## 2013-09-15 DIAGNOSIS — F331 Major depressive disorder, recurrent, moderate: Secondary | ICD-10-CM | POA: Diagnosis not present

## 2013-09-15 LAB — LIPID PANEL
CHOLESTEROL: 119 mg/dL (ref 0–200)
HDL: 58 mg/dL (ref >39)
LDL CALC: 53 mg/dL (ref 0–99)
TRIGLYCERIDES: 38 mg/dL (ref <150)
Total CHOL/HDL Ratio: 2.1 Ratio
VLDL: 8 mg/dL (ref 0–40)

## 2013-09-15 LAB — CBC WITH DIFFERENTIAL/PLATELET
BASOS ABS: 0 10*3/uL (ref 0.0–0.1)
Basophils Relative: 0 % (ref 0–1)
EOS PCT: 2 % (ref 0–5)
Eosinophils Absolute: 0.1 10*3/uL (ref 0.0–0.7)
HCT: 40.3 % (ref 36.0–46.0)
HEMOGLOBIN: 13.2 g/dL (ref 12.0–15.0)
LYMPHS ABS: 2 10*3/uL (ref 0.7–4.0)
Lymphocytes Relative: 35 % (ref 12–46)
MCH: 28.8 pg (ref 26.0–34.0)
MCHC: 32.8 g/dL (ref 30.0–36.0)
MCV: 87.8 fL (ref 78.0–100.0)
MONO ABS: 0.5 10*3/uL (ref 0.1–1.0)
MONOS PCT: 9 % (ref 3–12)
Neutro Abs: 3.1 10*3/uL (ref 1.7–7.7)
Neutrophils Relative %: 54 % (ref 43–77)
Platelets: 261 10*3/uL (ref 150–400)
RBC: 4.59 MIL/uL (ref 3.87–5.11)
RDW: 14.4 % (ref 11.5–15.5)
WBC: 5.8 10*3/uL (ref 4.0–10.5)

## 2013-09-15 LAB — COMPREHENSIVE METABOLIC PANEL
ALT: 12 U/L (ref 0–35)
AST: 13 U/L (ref 0–37)
Albumin: 3.9 g/dL (ref 3.5–5.2)
Alkaline Phosphatase: 56 U/L (ref 39–117)
BUN: 8 mg/dL (ref 6–23)
CALCIUM: 8.6 mg/dL (ref 8.4–10.5)
CHLORIDE: 107 meq/L (ref 96–112)
CO2: 27 meq/L (ref 19–32)
Creat: 0.88 mg/dL (ref 0.50–1.10)
GLUCOSE: 78 mg/dL (ref 70–99)
Potassium: 4.5 mEq/L (ref 3.5–5.3)
Sodium: 140 mEq/L (ref 135–145)
Total Bilirubin: 0.4 mg/dL (ref 0.2–1.2)
Total Protein: 6.6 g/dL (ref 6.0–8.3)

## 2013-09-15 LAB — TSH: TSH: 0.705 u[IU]/mL (ref 0.350–4.500)

## 2013-09-15 LAB — HEMOGLOBIN A1C
HEMOGLOBIN A1C: 5.6 % (ref <5.7)
Mean Plasma Glucose: 114 mg/dL (ref <117)

## 2013-09-20 DIAGNOSIS — F331 Major depressive disorder, recurrent, moderate: Secondary | ICD-10-CM | POA: Diagnosis not present

## 2013-09-20 DIAGNOSIS — F411 Generalized anxiety disorder: Secondary | ICD-10-CM | POA: Diagnosis not present

## 2013-10-13 ENCOUNTER — Ambulatory Visit: Payer: Self-pay | Admitting: Internal Medicine

## 2013-10-13 ENCOUNTER — Ambulatory Visit (INDEPENDENT_AMBULATORY_CARE_PROVIDER_SITE_OTHER): Payer: Medicare Other | Admitting: Internal Medicine

## 2013-10-13 VITALS — BP 141/93 | HR 82 | Temp 98.4°F

## 2013-10-13 DIAGNOSIS — Z20818 Contact with and (suspected) exposure to other bacterial communicable diseases: Secondary | ICD-10-CM | POA: Insufficient documentation

## 2013-10-13 DIAGNOSIS — Z0000 Encounter for general adult medical examination without abnormal findings: Secondary | ICD-10-CM | POA: Insufficient documentation

## 2013-10-13 DIAGNOSIS — A499 Bacterial infection, unspecified: Secondary | ICD-10-CM | POA: Diagnosis not present

## 2013-10-13 DIAGNOSIS — Z2089 Contact with and (suspected) exposure to other communicable diseases: Secondary | ICD-10-CM

## 2013-10-13 DIAGNOSIS — B9689 Other specified bacterial agents as the cause of diseases classified elsewhere: Secondary | ICD-10-CM

## 2013-10-13 DIAGNOSIS — H1089 Other conjunctivitis: Secondary | ICD-10-CM | POA: Diagnosis not present

## 2013-10-13 DIAGNOSIS — H109 Unspecified conjunctivitis: Secondary | ICD-10-CM | POA: Insufficient documentation

## 2013-10-13 MED ORDER — AZITHROMYCIN 1 % OP SOLN: 1.0000 [drp] | Freq: Two times a day (BID) | OPHTHALMIC | Status: DC

## 2013-10-13 MED ORDER — AMOXICILLIN 500 MG PO CAPS: 500.0000 mg | ORAL_CAPSULE | Freq: Two times a day (BID) | ORAL | Status: DC

## 2013-10-13 NOTE — Assessment & Plan Note (Addendum)
Daughter with strep throat. Pt with 3d sore throat with lymphadenopathy and chills. No pharyngeal exudate but with her exposure and other symptoms, will treat empirically with Amoxicillin. - Amoxicillin 500mg  BID x10d.  - Sending off Rapid Strep A Ag

## 2013-10-13 NOTE — Assessment & Plan Note (Signed)
Referred pt to GYN for pap/pelvic

## 2013-10-13 NOTE — Patient Instructions (Addendum)
**Take the Amoxicillin 500mg  2 times a day for 10 days. We will call you with the results of the strep test. If you do not hear anything, please call us on Friday.  **Use the Azithromycin eye drops, 1 drop in the right eye 2 times a day for 2 days, then 1 drop daily for 5 days. Also apply warm compresses to the eye. Try to avoid rubbing the eye and keep your hands clean.  **Return to the clinic in 1 week for a follow up with your primary care provider.     General Instructions:   Please bring your medicines with you each time you come to clinic.  Medicines may include prescription medications, over-the-counter medications, herbal remedies, eye drops, vitamins, or other pills.   Progress Toward Treatment Goals:  No flowsheet data found.  Self Care Goals & Plans:  No flowsheet data found.  No flowsheet data found.   Care Management & Community Referrals:  No flowsheet data found.        Bacterial Conjunctivitis Bacterial conjunctivitis, commonly called pink eye, is an inflammation of the clear membrane that covers the white part of the eye (conjunctiva). The inflammation can also happen on the underside of the eyelids. The blood vessels in the conjunctiva become inflamed, causing the eye to become red or pink. Bacterial conjunctivitis may spread easily from one eye to another and from person to person (contagious).  CAUSES  Bacterial conjunctivitis is caused by bacteria. The bacteria may come from your own skin, your upper respiratory tract, or from someone else with bacterial conjunctivitis. SYMPTOMS  The normally white color of the eye or the underside of the eyelid is usually pink or red. The pink eye is usually associated with irritation, tearing, and some sensitivity to light. Bacterial conjunctivitis is often associated with a thick, yellowish discharge from the eye. The discharge may turn into a crust on the eyelids overnight, which causes your eyelids to stick together.  If a discharge is present, there may also be some blurred vision in the affected eye. DIAGNOSIS  Bacterial conjunctivitis is diagnosed by your caregiver through an eye exam and the symptoms that you report. Your caregiver looks for changes in the surface tissues of your eyes, which may point to the specific type of conjunctivitis. A sample of any discharge may be collected on a cotton-tip swab if you have a severe case of conjunctivitis, if your cornea is affected, or if you keep getting repeat infections that do not respond to treatment. The sample will be sent to a lab to see if the inflammation is caused by a bacterial infection and to see if the infection will respond to antibiotic medicines. TREATMENT   Bacterial conjunctivitis is treated with antibiotics. Antibiotic eyedrops are most often used. However, antibiotic ointments are also available. Antibiotics pills are sometimes used. Artificial tears or eye washes may ease discomfort. HOME CARE INSTRUCTIONS   To ease discomfort, apply a cool, clean washcloth to your eye for 10-20 minutes, 3-4 times a day.  Gently wipe away any drainage from your eye with a warm, wet washcloth or a cotton ball.  Wash your hands often with soap and water. Use paper towels to dry your hands.  Do not share towels or washcloths. This may spread the infection.  Change or wash your pillowcase every day.  You should not use eye makeup until the infection is gone.  Do not operate machinery or drive if your vision is blurred.  Stop using contact  lenses. Ask your caregiver how to sterilize or replace your contacts before using them again. This depends on the type of contact lenses that you use.  When applying medicine to the infected eye, do not touch the edge of your eyelid with the eyedrop bottle or ointment tube. SEEK IMMEDIATE MEDICAL CARE IF:   Your infection has not improved within 3 days after beginning treatment.  You had yellow discharge from your eye  and it returns.  You have increased eye pain.  Your eye redness is spreading.  Your vision becomes blurred.  You have a fever or persistent symptoms for more than 2-3 days.  You have a fever and your symptoms suddenly get worse.  You have facial pain, redness, or swelling. MAKE SURE YOU:   Understand these instructions.  Will watch your condition.  Will get help right away if you are not doing well or get worse. Document Released: 01/14/2005 Document Revised: 05/31/2013 Document Reviewed: 06/17/2011 [facility] Patient Information 2015 [place], [place]. This information is not intended to replace advice given to you by your health care provider. Make sure you discuss any questions you have with your health care provider.

## 2013-10-13 NOTE — Assessment & Plan Note (Signed)
Daughter with Pink Eye and is on eye drops per pt. Pt endorses yellow-green discharge from the eye, which is itchy and painful. On exam, the right eye is very injected with clear drainage. Given her exposure to conjunctivitis and her symptoms, will treat for bacterial conjunctivitis. - Azithromycin 1% opthalmic solution: 1 drop in the right eye BID x2 days, then 1 drop qday x5 days - Pt instructed to apply warm compresses to the eye frequently - Pt instructed to avoid touching her eye and keep her hands clean as bacterial conjunctivitis is easily spread.

## 2013-10-13 NOTE — Progress Notes (Signed)
Patient ID: Lindsay [name], female   DOB: 1970/10/28, 43 y.o.   MRN: [id number]  Subjective:   Patient ID: Lindsay [name] female   DOB: [birth year] 43 y.o.   MRN: [id number]  HPI: [name] is a 43 y.o. F w/ PMH GAD, depression, and GERD presents with acute onset sore throat.   She states that she began having a sore throat, chills, and neck tenderness 3-4days ago and is having eye redness, drainage, and pain x3-4 days as well. She reports that her daughter is sick with strep throat and pink eye.     Past Medical History  Diagnosis Date  . Depression with anxiety   . Hypertension   . Anorexia   . IBS (irritable bowel syndrome)   . Low back pain   . Edema leg   . Hemorrhoids   . Panic attacks   . Anemia     Iron Def  . Colon polyp 2009  . Migraines   . Colon polyps   . Neuromuscular disorder     fibromyalgia  . Tonsil pain   . Polyp of vocal cord or larynx   . Hidradenitis suppurativa   . Depression   . Chronic kidney disease     Nephrotic syndrome   Current Outpatient Prescriptions  Medication Sig Dispense Refill  . ARIPiprazole (ABILIFY) 2 MG tablet Take 1 tablet (2 mg total) by mouth every morning.  30 tablet  1  . Ascorbic Acid (VITAMIN C PO) Take 1 tablet by mouth daily as needed (For Vitamin C supplement.).       [name] buPROPion (WELLBUTRIN XL) 300 MG 24 hr tablet TAKE 1 TABLET (300 MG TOTAL) BY MOUTH EVERY MORNING.  30 tablet  1  . clorazepate (TRANXENE) 15 MG tablet Take 1 tablet (15 mg total) by mouth every morning.  30 tablet  1  . gabapentin (NEURONTIN) 400 MG capsule TAKE 1 CAPSULE (400 MG TOTAL) BY MOUTH AT BEDTIME.  30 capsule  1  . hydrOXYzine (ATARAX/VISTARIL) 25 MG tablet Take 1 tablet (25 mg total) by mouth 2 (two) times daily.  60 tablet  1  . ibuprofen (ADVIL,MOTRIN) 200 MG tablet Take 800 mg by mouth every 6 (six) hours as needed (For pain.).       No current facility-administered medications for this visit.   Family History  Problem Relation Age of  Onset  . Colon cancer Neg Hx   . Diabetes Mother   . Heart disease Mother     valve leak  . Cancer Father     prostate  . Heart disease Father    History   Social History  . Marital Status: Single    Spouse Name: N/A    Number of Children: N/A  . Years of Education: N/A   Social History Main Topics  . Smoking status: Current Every Day Smoker -- 1.50 packs/day for 25 years    Types: Cigarettes  . Smokeless tobacco: Never Used     Comment: 10 cigarette a day  . Alcohol Use: No     Comment: Occasional  . Drug Use: No     Comment: Occasional  . Sexual Activity: No     Comment: States she has a Lenda Kelp that was placed aprroximately 2 years ago   Other Topics Concern  . Not on file   Social History Narrative  . No narrative on file   Review of Systems: A 12 point ROS was performed; pertinent positives and negatives are noted below  Constitutional: Denies fever. +chills and fatigue.  HEENT: +R eye pain, redness, itching, green-yellow drainage. +sore throat, hoarseness, and tenderness in her neck to touch. Respiratory: Denies SOB. + productive cough with yellow mucus.   Cardiovascular: Denies chest pain.  Skin: Denies rash   Objective:  Physical Exam: Filed Vitals:   10/13/13 1429  BP: 141/93  Pulse: 82  Temp: 98.4 F (36.9 C)  TempSrc: Oral  SpO2: 100%   Constitutional: Vital signs reviewed.  Patient is a well-developed and well-nourished female who is in moderate distress. Alert and oriented x3.  Head: Normocephalic and atraumatic Ear: TM appear normal bilaterally, exam limited by cerumen in bilateral ear canals Nose: Greenish drainage noted.  Turbinates normal Mouth: +erythema. No exudates Eyes: PERRL, EOMI, No scleral icterus.  Neck: Supple, Trachea midline, normal ROM Cardiovascular: RRR, no MRG Pulmonary/Chest: Normal respiratory effort, CTAB, no wheezes, rales, or rhonchi Abdominal: Soft. Non-tender, non-distended Musculoskeletal: Moves all 4  extremities Hematology: +sublingual adenopathy.  Neurological: A&O x3, no focal deficits   Skin: Warm, dry and intact.  Psychiatric: Appears ill.   Assessment & Plan:   Please refer to Problem List based Assessment and Plan

## 2013-10-13 NOTE — Progress Notes (Signed)
INTERNAL MEDICINE TEACHING ATTENDING ADDENDUM - Talal Fritchman, MD: I reviewed and discussed at the time of visit with the resident Dr. Glenn, the patient's medical history, physical examination, diagnosis and results of pertinent tests and treatment and I agree with the patient's care as documented.  

## 2013-10-14 ENCOUNTER — Telehealth: Payer: Self-pay | Admitting: *Deleted

## 2013-10-14 DIAGNOSIS — F411 Generalized anxiety disorder: Secondary | ICD-10-CM | POA: Diagnosis not present

## 2013-10-14 DIAGNOSIS — F331 Major depressive disorder, recurrent, moderate: Secondary | ICD-10-CM | POA: Diagnosis not present

## 2013-10-14 LAB — RAPID STREP SCREEN (MED CTR MEBANE ONLY): STREPTOCOCCUS, GROUP A SCREEN (DIRECT): NEGATIVE

## 2013-10-14 NOTE — Telephone Encounter (Signed)
Received PA request from pt's pharmacy for azasite 1% eye drops. Medication is non-preferred. See preferred list of all antibiotics below (oral, topical, and drops)

## 2013-10-15 ENCOUNTER — Other Ambulatory Visit: Payer: Self-pay | Admitting: Internal Medicine

## 2013-10-15 DIAGNOSIS — F411 Generalized anxiety disorder: Secondary | ICD-10-CM | POA: Diagnosis not present

## 2013-10-15 DIAGNOSIS — F331 Major depressive disorder, recurrent, moderate: Secondary | ICD-10-CM | POA: Diagnosis not present

## 2013-10-15 MED ORDER — AZITHROMYCIN 1 % OP SOLN: OPHTHALMIC | Status: DC

## 2013-10-16 LAB — CULTURE, GROUP A STREP: Organism ID, Bacteria: NORMAL

## 2013-10-18 DIAGNOSIS — F331 Major depressive disorder, recurrent, moderate: Secondary | ICD-10-CM | POA: Diagnosis not present

## 2013-10-18 DIAGNOSIS — F411 Generalized anxiety disorder: Secondary | ICD-10-CM | POA: Diagnosis not present

## 2013-10-20 ENCOUNTER — Encounter: Payer: Self-pay | Admitting: Internal Medicine

## 2013-10-20 ENCOUNTER — Ambulatory Visit (INDEPENDENT_AMBULATORY_CARE_PROVIDER_SITE_OTHER): Payer: Medicare Other | Admitting: Internal Medicine

## 2013-10-20 VITALS — BP 150/87 | HR 78 | Temp 98.4°F | Ht 70.0 in | Wt 267.0 lb

## 2013-10-20 DIAGNOSIS — A499 Bacterial infection, unspecified: Secondary | ICD-10-CM

## 2013-10-20 DIAGNOSIS — H60393 Other infective otitis externa, bilateral: Secondary | ICD-10-CM

## 2013-10-20 DIAGNOSIS — H109 Unspecified conjunctivitis: Secondary | ICD-10-CM

## 2013-10-20 DIAGNOSIS — J302 Other seasonal allergic rhinitis: Secondary | ICD-10-CM

## 2013-10-20 DIAGNOSIS — B9689 Other specified bacterial agents as the cause of diseases classified elsewhere: Secondary | ICD-10-CM | POA: Diagnosis not present

## 2013-10-20 DIAGNOSIS — J040 Acute laryngitis: Secondary | ICD-10-CM

## 2013-10-20 DIAGNOSIS — F331 Major depressive disorder, recurrent, moderate: Secondary | ICD-10-CM | POA: Diagnosis not present

## 2013-10-20 DIAGNOSIS — H60399 Other infective otitis externa, unspecified ear: Secondary | ICD-10-CM

## 2013-10-20 DIAGNOSIS — J301 Allergic rhinitis due to pollen: Secondary | ICD-10-CM | POA: Diagnosis not present

## 2013-10-20 DIAGNOSIS — F411 Generalized anxiety disorder: Secondary | ICD-10-CM | POA: Diagnosis not present

## 2013-10-20 DIAGNOSIS — H1089 Other conjunctivitis: Secondary | ICD-10-CM | POA: Diagnosis not present

## 2013-10-20 MED ORDER — CETIRIZINE HCL 10 MG PO CHEW: 10.0000 mg | CHEWABLE_TABLET | Freq: Every day | ORAL | Status: DC

## 2013-10-20 MED ORDER — ERYTHROMYCIN 5 MG/GM OP OINT: 1.0000 "application " | TOPICAL_OINTMENT | Freq: Four times a day (QID) | OPHTHALMIC | Status: DC

## 2013-10-20 MED ORDER — ERYTHROMYCIN 5 MG/GM OP OINT: 1.0000 "application " | TOPICAL_OINTMENT | Freq: Every day | OPHTHALMIC | Status: DC

## 2013-10-20 MED ORDER — OFLOXACIN 0.3 % OT SOLN: 10.0000 [drp] | Freq: Every day | OTIC | Status: DC

## 2013-10-20 NOTE — Patient Instructions (Signed)
-You have otitis externa, which is an infection of the ear canal which is worse on the left side. Start using the ofloxacin ear drops with 10 drops in each ear for 10 days. You may apply a cotton ball to the outside of your ears to keep the drops from leaking out.  -You have bacterial conjunctivitis. Start using erythromycin ointment, apply a pea size to each eye 4 times per day. Use it for 5 to 7 days depending how quickly your symptoms improve.  -Stay hydrated, you may want drink cold beverages to soothe your throat.  -You may take Motrin and Tylenol for the inflammation in your throat.  -Continue using lozenges to soothe your throat.  -Go to the ER if you develop severe jaw pain, if you have changes in vision, fever/chills, or vomiting and you are not able to eat or drink.   Laryngitis Laryngitis is redness, soreness, and puffiness (inflammation) of the vocal cords. It causes hoarseness, cough, loss of voice, sore throat, and dry throat. It may be caused by:  Infection.  Too much smoking.  Too much talking or yelling.  Breathing in of toxic fumes.  Allergies.  A backup of acid from your stomach. HOME CARE  Drink enough fluids to keep your pee (urine) clear or pale yellow.  Rest until you no longer have problems or as told by your doctor.  Breathe in moist air.  Take all medicine as told by your doctor.  Do not smoke.  Talk as little as possible (this includes whispering).  Write on paper instead of talking until your voice is back to normal.  Follow up with your doctor if you have not improved after 10 days. GET HELP IF:   You have trouble breathing.  You cough up blood.  You have a fever that will not go away.  You have increasing pain.  You have trouble swallowing. MAKE SURE YOU:  Understand these instructions.  Will watch your condition.  Will get help right away if you are not doing well or get worse. Document Released: 01/03/2011 Document Revised:  04/08/2011 Document Reviewed: 01/03/2011 [facility] Patient Information 2015 [place], [place]. This information is not intended to replace advice given to you by your health care provider. Make sure you discuss any questions you have with your health care provider.  Bacterial Conjunctivitis Bacterial conjunctivitis, commonly called pink eye, is an inflammation of the clear membrane that covers the white part of the eye (conjunctiva). The inflammation can also happen on the underside of the eyelids. The blood vessels in the conjunctiva become inflamed, causing the eye to become red or pink. Bacterial conjunctivitis may spread easily from one eye to another and from person to person (contagious).  CAUSES  Bacterial conjunctivitis is caused by bacteria. The bacteria may come from your own skin, your upper respiratory tract, or from someone else with bacterial conjunctivitis. SYMPTOMS  The normally white color of the eye or the underside of the eyelid is usually pink or red. The pink eye is usually associated with irritation, tearing, and some sensitivity to light. Bacterial conjunctivitis is often associated with a thick, yellowish discharge from the eye. The discharge may turn into a crust on the eyelids overnight, which causes your eyelids to stick together. If a discharge is present, there may also be some blurred vision in the affected eye. DIAGNOSIS  Bacterial conjunctivitis is diagnosed by your caregiver through an eye exam and the symptoms that you report. Your caregiver looks for changes in  the surface tissues of your eyes, which may point to the specific type of conjunctivitis. A sample of any discharge may be collected on a cotton-tip swab if you have a severe case of conjunctivitis, if your cornea is affected, or if you keep getting repeat infections that do not respond to treatment. The sample will be sent to a lab to see if the inflammation is caused by a bacterial infection and to see if the  infection will respond to antibiotic medicines. TREATMENT   Bacterial conjunctivitis is treated with antibiotics. Antibiotic eyedrops are most often used. However, antibiotic ointments are also available. Antibiotics pills are sometimes used. Artificial tears or eye washes may ease discomfort. HOME CARE INSTRUCTIONS   To ease discomfort, apply a cool, clean washcloth to your eye for 10-20 minutes, 3-4 times a day.  Gently wipe away any drainage from your eye with a warm, wet washcloth or a cotton ball.  Wash your hands often with soap and water. Use paper towels to dry your hands.  Do not share towels or washcloths. This may spread the infection.  Change or wash your pillowcase every day.  You should not use eye makeup until the infection is gone.  Do not operate machinery or drive if your vision is blurred.  Stop using contact lenses. Ask your caregiver how to sterilize or replace your contacts before using them again. This depends on the type of contact lenses that you use.  When applying medicine to the infected eye, do not touch the edge of your eyelid with the eyedrop bottle or ointment tube. SEEK IMMEDIATE MEDICAL CARE IF:   Your infection has not improved within 3 days after beginning treatment.  You had yellow discharge from your eye and it returns.  You have increased eye pain.  Your eye redness is spreading.  Your vision becomes blurred.  You have a fever or persistent symptoms for more than 2-3 days.  You have a fever and your symptoms suddenly get worse.  You have facial pain, redness, or swelling. MAKE SURE YOU:   Understand these instructions.  Will watch your condition.  Will get help right away if you are not doing well or get worse. Document Released: 01/14/2005 Document Revised: 05/31/2013 Document Reviewed: 06/17/2011 [facility] Patient Information 2015 [place], [place]. This information is not intended to replace advice given to you by your health care  provider. Make sure you discuss any questions you have with your health care provider.  Otitis Externa Otitis externa is a bacterial or fungal infection of the outer ear canal. This is the area from the eardrum to the outside of the ear. Otitis externa is sometimes called "swimmer's ear." CAUSES  Possible causes of infection include:  Swimming in dirty water.  Moisture remaining in the ear after swimming or bathing.  Mild injury (trauma) to the ear.  Objects stuck in the ear (foreign body).  Cuts or scrapes (abrasions) on the outside of the ear. SIGNS AND SYMPTOMS  The first symptom of infection is often itching in the ear canal. Later signs and symptoms may include swelling and redness of the ear canal, ear pain, and yellowish-white fluid (pus) coming from the ear. The ear pain may be worse when pulling on the earlobe. DIAGNOSIS  Your health care provider will perform a physical exam. A sample of fluid may be taken from the ear and examined for bacteria or fungi. TREATMENT  Antibiotic ear drops are often given for 10 to 14 days. Treatment may also  include pain medicine or corticosteroids to reduce itching and swelling. HOME CARE INSTRUCTIONS   Apply antibiotic ear drops to the ear canal as prescribed by your health care provider.  Take medicines only as directed by your health care provider.  If you have diabetes, follow any additional treatment instructions from your health care provider.  Keep all follow-up visits as directed by your health care provider. PREVENTION   Keep your ear dry. Use the corner of a towel to absorb water out of the ear canal after swimming or bathing.  Avoid scratching or putting objects inside your ear. This can damage the ear canal or remove the protective wax that lines the canal. This makes it easier for bacteria and fungi to grow.  Avoid swimming in lakes, polluted water, or poorly chlorinated pools.  You may use ear drops made of rubbing alcohol  and vinegar after swimming. Combine equal parts of white vinegar and alcohol in a bottle. Put 3 or 4 drops into each ear after swimming. SEEK MEDICAL CARE IF:   You have a fever.  Your ear is still red, swollen, painful, or draining pus after 3 days.  Your redness, swelling, or pain gets worse.  You have a severe headache.  You have redness, swelling, pain, or tenderness in the area behind your ear. MAKE SURE YOU:   Understand these instructions.  Will watch your condition.  Will get help right away if you are not doing well or get worse. Document Released: 01/14/2005 Document Revised: 05/31/2013 Document Reviewed: 01/31/2011 [facility] Patient Information 2015 [place], [place]. This information is not intended to replace advice given to you by your health care provider. Make sure you discuss any questions you have with your health care provider.

## 2013-10-21 DIAGNOSIS — H60399 Other infective otitis externa, unspecified ear: Secondary | ICD-10-CM | POA: Insufficient documentation

## 2013-10-21 DIAGNOSIS — J302 Other seasonal allergic rhinitis: Secondary | ICD-10-CM | POA: Insufficient documentation

## 2013-10-21 DIAGNOSIS — J040 Acute laryngitis: Secondary | ICD-10-CM | POA: Insufficient documentation

## 2013-10-21 DIAGNOSIS — H109 Unspecified conjunctivitis: Secondary | ICD-10-CM | POA: Insufficient documentation

## 2013-10-21 NOTE — Assessment & Plan Note (Addendum)
Rx erythromycin op ointment 4 times  X 7days which has no co-pay with her insurance

## 2013-10-21 NOTE — Progress Notes (Signed)
   Subjective:    Patient ID: Lindsay [name], female    DOB: [birth year], 43 y.o.   MRN: [id number]  HPI [name] is a 43 year old woman with PMH of anxiety, depression, presenting for follow up visit for URI symptoms, bilateral conjunctivitis for 2 weeks and ear pain for 2-3 days. She was seen at the [facility] last week, was prescribed amoxicillin PO which she finished the day prior to this visit but states that her sore throat is worse. She was concerned that the had Strep throat because her daughter had been diagnosed with this but throat swab and culture were negative for infectious organisms. She could not afford the eye drops and continues to have bilaterally injected conjunctiva with discharge noticed in the morning.  She started having ear pain 3 days ago, denies scratching her ears or using Qtips, has no decreased hearing.    Review of Systems  Constitutional: Negative for fever, chills, diaphoresis, activity change, appetite change, fatigue and unexpected weight change.  HENT: Positive for congestion, sore throat and voice change. Negative for hearing loss, postnasal drip, rhinorrhea, sinus pressure and sneezing.   Eyes: Positive for pain, discharge and redness. Negative for photophobia and itching.  Respiratory: Positive for cough. Negative for shortness of breath.   Cardiovascular: Negative for chest pain.  Gastrointestinal: Negative for nausea, vomiting, abdominal pain and diarrhea.  Genitourinary: Negative for dysuria.  Skin: Negative for rash.  Neurological: Negative for dizziness, weakness and light-headedness.  Hematological: Negative for adenopathy.  Psychiatric/Behavioral: Negative for agitation.       Objective:   Physical Exam  Nursing note and vitals reviewed. Constitutional: She is oriented to person, place, and time. She appears well-developed and well-nourished.  Tired appearing  HENT:  Right ear canal with redness, no bleeding, no discharge, TM intact, non bulging Left  ear canal with mild redness, intact TM Tonsils with erythema but no edema or exudate Hoarseness   Eyes: Right eye exhibits no discharge. Left eye exhibits no discharge.  Bilateral conjunctiva diffusely injected with no noticed discharge   Cardiovascular: Normal rate and regular rhythm.   Pulmonary/Chest: Effort normal. No respiratory distress. She has no wheezes. She has no rales.  Lymphadenopathy:    She has no cervical adenopathy.  Neurological: She is alert and oriented to person, place, and time.  Skin: Skin is warm and dry. She is not diaphoretic.  Psychiatric: She has a normal mood and affect.          Assessment & Plan:

## 2013-10-21 NOTE — Assessment & Plan Note (Signed)
She will start Zyrtec 10mg  daily

## 2013-10-21 NOTE — Assessment & Plan Note (Addendum)
Likely due to viral etiology with no stridor or SOB.  Oral hydration, rest, lemon tea, lozenges She was advised to go to the ED if she has fever/chills, not able to eat/drink or her symptoms worsen She was given work note with return to work date on 9/28

## 2013-10-21 NOTE — Assessment & Plan Note (Signed)
Pt did not improve with amoxicillin Ofloxacin 0.3% otic solution, 10 drops in each ear for 10 days.

## 2013-10-25 DIAGNOSIS — F411 Generalized anxiety disorder: Secondary | ICD-10-CM | POA: Diagnosis not present

## 2013-10-25 DIAGNOSIS — F331 Major depressive disorder, recurrent, moderate: Secondary | ICD-10-CM | POA: Diagnosis not present

## 2013-10-25 NOTE — Progress Notes (Signed)
Medicine attending: Medical history, presented problems, physical findings, medications, reviewed with resident physician Dr. Hayes Ludwig on the day of the patient's clinic visit and I concurred with her evaluation and management plan. Murriel Hopper, M.D., [place]

## 2013-10-27 ENCOUNTER — Encounter: Payer: Self-pay | Admitting: Obstetrics & Gynecology

## 2013-10-27 DIAGNOSIS — F331 Major depressive disorder, recurrent, moderate: Secondary | ICD-10-CM | POA: Diagnosis not present

## 2013-10-27 DIAGNOSIS — F411 Generalized anxiety disorder: Secondary | ICD-10-CM | POA: Diagnosis not present

## 2013-10-29 ENCOUNTER — Other Ambulatory Visit (HOSPITAL_COMMUNITY): Payer: Self-pay | Admitting: Psychiatry

## 2013-10-29 NOTE — Telephone Encounter (Signed)
Chart reviewed, refill not appropriate. Has appointment 10/6 at time refill is due. Will be reviewed at appointment and filled if needed.

## 2013-11-01 DIAGNOSIS — F331 Major depressive disorder, recurrent, moderate: Secondary | ICD-10-CM | POA: Diagnosis not present

## 2013-11-01 DIAGNOSIS — F411 Generalized anxiety disorder: Secondary | ICD-10-CM | POA: Diagnosis not present

## 2013-11-02 ENCOUNTER — Ambulatory Visit (INDEPENDENT_AMBULATORY_CARE_PROVIDER_SITE_OTHER): Payer: Medicare Other | Admitting: Psychiatry

## 2013-11-02 ENCOUNTER — Encounter (HOSPITAL_COMMUNITY): Payer: Self-pay | Admitting: Psychiatry

## 2013-11-02 VITALS — BP 128/88 | HR 80 | Ht 70.0 in | Wt 272.8 lb

## 2013-11-02 DIAGNOSIS — F331 Major depressive disorder, recurrent, moderate: Secondary | ICD-10-CM | POA: Diagnosis not present

## 2013-11-02 DIAGNOSIS — F411 Generalized anxiety disorder: Secondary | ICD-10-CM | POA: Diagnosis not present

## 2013-11-02 MED ORDER — ARIPIPRAZOLE 2 MG PO TABS: 2.0000 mg | ORAL_TABLET | Freq: Every morning | ORAL | Status: DC

## 2013-11-02 MED ORDER — BUPROPION HCL ER (XL) 300 MG PO TB24: ORAL_TABLET | ORAL | Status: DC

## 2013-11-02 MED ORDER — GABAPENTIN 400 MG PO CAPS: ORAL_CAPSULE | ORAL | Status: DC

## 2013-11-02 MED ORDER — CLORAZEPATE DIPOTASSIUM 15 MG PO TABS: 15.0000 mg | ORAL_TABLET | Freq: Every morning | ORAL | Status: DC
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
## 2013-11-03 DIAGNOSIS — F331 Major depressive disorder, recurrent, moderate: Secondary | ICD-10-CM | POA: Diagnosis not present

## 2013-11-03 DIAGNOSIS — F411 Generalized anxiety disorder: Secondary | ICD-10-CM | POA: Diagnosis not present

## 2013-11-07 ENCOUNTER — Other Ambulatory Visit (HOSPITAL_COMMUNITY): Payer: Self-pay | Admitting: Psychiatry

## 2013-11-07 NOTE — Telephone Encounter (Signed)
Given on 11/02/13 with refills.

## 2013-11-08 DIAGNOSIS — F411 Generalized anxiety disorder: Secondary | ICD-10-CM | POA: Diagnosis not present

## 2013-11-08 DIAGNOSIS — F331 Major depressive disorder, recurrent, moderate: Secondary | ICD-10-CM | POA: Diagnosis not present

## 2013-11-10 DIAGNOSIS — F411 Generalized anxiety disorder: Secondary | ICD-10-CM | POA: Diagnosis not present

## 2013-11-10 DIAGNOSIS — F331 Major depressive disorder, recurrent, moderate: Secondary | ICD-10-CM | POA: Diagnosis not present

## 2013-11-15 DIAGNOSIS — F331 Major depressive disorder, recurrent, moderate: Secondary | ICD-10-CM | POA: Diagnosis not present

## 2013-11-15 DIAGNOSIS — F411 Generalized anxiety disorder: Secondary | ICD-10-CM | POA: Diagnosis not present

## 2013-11-18 DIAGNOSIS — F331 Major depressive disorder, recurrent, moderate: Secondary | ICD-10-CM | POA: Diagnosis not present

## 2013-11-18 DIAGNOSIS — F411 Generalized anxiety disorder: Secondary | ICD-10-CM | POA: Diagnosis not present

## 2013-11-22 DIAGNOSIS — F411 Generalized anxiety disorder: Secondary | ICD-10-CM | POA: Diagnosis not present

## 2013-11-22 DIAGNOSIS — F331 Major depressive disorder, recurrent, moderate: Secondary | ICD-10-CM | POA: Diagnosis not present

## 2013-11-25 DIAGNOSIS — F411 Generalized anxiety disorder: Secondary | ICD-10-CM | POA: Diagnosis not present

## 2013-11-25 DIAGNOSIS — F331 Major depressive disorder, recurrent, moderate: Secondary | ICD-10-CM | POA: Diagnosis not present

## 2013-11-29 DIAGNOSIS — F331 Major depressive disorder, recurrent, moderate: Secondary | ICD-10-CM | POA: Diagnosis not present

## 2013-11-29 DIAGNOSIS — F411 Generalized anxiety disorder: Secondary | ICD-10-CM | POA: Diagnosis not present

## 2013-12-02 DIAGNOSIS — F411 Generalized anxiety disorder: Secondary | ICD-10-CM | POA: Diagnosis not present

## 2013-12-02 DIAGNOSIS — F331 Major depressive disorder, recurrent, moderate: Secondary | ICD-10-CM | POA: Diagnosis not present

## 2013-12-06 DIAGNOSIS — F331 Major depressive disorder, recurrent, moderate: Secondary | ICD-10-CM | POA: Diagnosis not present

## 2013-12-06 DIAGNOSIS — F411 Generalized anxiety disorder: Secondary | ICD-10-CM | POA: Diagnosis not present

## 2013-12-08 ENCOUNTER — Encounter: Payer: Self-pay | Admitting: Obstetrics & Gynecology

## 2013-12-08 ENCOUNTER — Ambulatory Visit (INDEPENDENT_AMBULATORY_CARE_PROVIDER_SITE_OTHER): Payer: Medicare Other | Admitting: Obstetrics & Gynecology

## 2013-12-08 ENCOUNTER — Other Ambulatory Visit (HOSPITAL_COMMUNITY)
Admission: RE | Admit: 2013-12-08 | Discharge: 2013-12-08 | Disposition: A | Discharge status: Home / Self Care | Payer: Medicare Other | Source: Ambulatory Visit | Attending: Obstetrics & Gynecology | Admitting: Obstetrics & Gynecology

## 2013-12-08 VITALS — BP 138/84 | HR 80 | Temp 98.6°F | Resp 20 | Ht 69.0 in | Wt 279.9 lb

## 2013-12-08 DIAGNOSIS — Z1231 Encounter for screening mammogram for malignant neoplasm of breast: Secondary | ICD-10-CM

## 2013-12-08 DIAGNOSIS — G8929 Other chronic pain: Secondary | ICD-10-CM | POA: Diagnosis not present

## 2013-12-08 DIAGNOSIS — R102 Pelvic and perineal pain: Secondary | ICD-10-CM

## 2013-12-08 DIAGNOSIS — Z01419 Encounter for gynecological examination (general) (routine) without abnormal findings: Secondary | ICD-10-CM | POA: Diagnosis not present

## 2013-12-08 DIAGNOSIS — Z124 Encounter for screening for malignant neoplasm of cervix: Secondary | ICD-10-CM | POA: Diagnosis not present

## 2013-12-08 DIAGNOSIS — F331 Major depressive disorder, recurrent, moderate: Secondary | ICD-10-CM | POA: Diagnosis not present

## 2013-12-08 DIAGNOSIS — Z1151 Encounter for screening for human papillomavirus (HPV): Secondary | ICD-10-CM | POA: Insufficient documentation

## 2013-12-08 DIAGNOSIS — Z01812 Encounter for preprocedural laboratory examination: Secondary | ICD-10-CM | POA: Diagnosis not present

## 2013-12-08 DIAGNOSIS — Z30432 Encounter for removal of intrauterine contraceptive device: Secondary | ICD-10-CM | POA: Diagnosis not present

## 2013-12-08 DIAGNOSIS — F411 Generalized anxiety disorder: Secondary | ICD-10-CM | POA: Diagnosis not present

## 2013-12-08 DIAGNOSIS — N949 Unspecified condition associated with female genital organs and menstrual cycle: Secondary | ICD-10-CM | POA: Diagnosis not present

## 2013-12-08 DIAGNOSIS — Z30019 Encounter for initial prescription of contraceptives, unspecified: Secondary | ICD-10-CM

## 2013-12-08 LAB — POCT PREGNANCY, URINE: Preg Test, Ur: NEGATIVE

## 2013-12-08 MED ORDER — NORETHINDRONE 0.35 MG PO TABS: 1.0000 | ORAL_TABLET | Freq: Every day | ORAL | Status: DC

## 2013-12-08 NOTE — Progress Notes (Signed)
    GYNECOLOGY CLINIC ANNUAL PREVENTATIVE CARE ENCOUNTER NOTE  Subjective:     Lindsay [name] is a 43 y.o. female here for a routine annual gynecologic exam.  Current complaints: Mirena IUD has been in place for over 7 years, she is concerned it is embedded as she cannot feel the strings.  Wants this removed if possible. Also reports chronic pelvic pain which she attributes to the IUD.  No other concerns.     Gynecologic History Patient's last menstrual period was 12/06/2013. Contraception: IUD Last Pap: 07/27/10. Results were: normal.  Never had a mammogram.  The following portions of the patient's history were reviewed and updated as appropriate: allergies, current medications, past family history, past medical history, past social history, past surgical history and problem list.  Review of Systems Pertinent items are noted in HPI.    Objective:   BP 138/84 mmHg  Pulse 80  Temp(Src) 98.6 F (37 C) (Oral)  Resp 20  Ht 5\' 9"  (1.753 m)  Wt 279 lb 14.4 oz (126.962 kg)  BMI 41.32 kg/m2  LMP 12/06/2013 GENERAL: Well-developed, well-nourished female in no acute distress.  HEENT: Normocephalic, atraumatic. Sclerae anicteric.  NECK: Supple. Normal thyroid.  LUNGS: Clear to auscultation bilaterally.  HEART: Regular rate and rhythm. BREASTS: Symmetric in size. No masses, skin changes, nipple drainage, or lymphadenopathy. ABDOMEN: Soft, nontender, nondistended. No organomegaly. PELVIC: Normal external female genitalia. Vagina is pink and rugated.  Normal discharge. Normal cervix contour; IUD strings initially not visualized but were extruded from cervical canal using cytobrush during Pap smear. Unable to feel uterus or adnexa secondary to habitus.  Diffuse tenderness on exam.  EXTREMITIES: No cyanosis, clubbing, or edema, 2+ distal pulses.   IUD Removal  During the pelvic exam, the strings of the IUD were grasped and pulled using ring forceps. The IUD was removed in its entirety.   Patient tolerated the procedure well.  Patient will use Micronor for contraception   Assessment:   Annual gynecologic examination Mirena removal Chronic pelvic pain   Plan:   Pap done, will follow up results and manage accordingly. Micronor prescribed for now. Smokes a lot and borderline HTN, increased risk of thromboembolic disease with estrogen. May decide to have Mirena replaced after analysis for pelvic pain. Mammogram and pelvic ultrasound scheduled Routine preventative health maintenance measures emphasized   Verita Schneiders, MD, FACOG Attending [place] for [place], [place]

## 2013-12-08 NOTE — Patient Instructions (Signed)
Pelvic Pain Female pelvic pain can be caused by many different things and start from a variety of places. Pelvic pain refers to pain that is located in the lower half of the abdomen and between your hips. The pain may occur over a short period of time (acute) or may be reoccurring (chronic). The cause of pelvic pain may be related to disorders affecting the female reproductive organs (gynecologic), but it may also be related to the bladder, kidney stones, an intestinal complication, or muscle or skeletal problems. Getting help right away for pelvic pain is important, especially if there has been severe, sharp, or a sudden onset of unusual pain. It is also important to get help right away because some types of pelvic pain can be life threatening.  CAUSES  Below are only some of the causes of pelvic pain. The causes of pelvic pain can be in one of several categories.   Gynecologic.  Pelvic inflammatory disease.  Sexually transmitted infection.  Ovarian cyst or a twisted ovarian ligament (ovarian torsion).  Uterine lining that grows outside the uterus (endometriosis).  Fibroids, cysts, or tumors.  Ovulation.  Pregnancy.  Pregnancy that occurs outside the uterus (ectopic pregnancy).  Miscarriage.  Labor.  Abruption of the placenta or ruptured uterus.  Infection.  Uterine infection (endometritis).  Bladder infection.  Diverticulitis.  Miscarriage related to a uterine infection (septic abortion).  Bladder.  Inflammation of the bladder (cystitis).  Kidney stone(s).  Gastrointestinal.  Constipation.  Diverticulitis.  Neurologic.  Trauma.  Feeling pelvic pain because of mental or emotional causes (psychosomatic).  Cancers of the bowel or pelvis. EVALUATION  Your caregiver will want to take a careful history of your concerns. This includes recent changes in your health, a careful gynecologic history of your periods (menses), and a sexual history. Obtaining your family  history and medical history is also important. Your caregiver may suggest a pelvic exam. A pelvic exam will help identify the location and severity of the pain. It also helps in the evaluation of which organ system may be involved. In order to identify the cause of the pelvic pain and be properly treated, your caregiver may order tests. These tests may include:   A pregnancy test.  Pelvic ultrasonography.  An X-ray exam of the abdomen.  A urinalysis or evaluation of vaginal discharge.  Blood tests. HOME CARE INSTRUCTIONS   Only take over-the-counter or prescription medicines for pain, discomfort, or fever as directed by your caregiver.   Rest as directed by your caregiver.   Eat a balanced diet.   Drink enough fluids to make your urine clear or pale yellow, or as directed.   Avoid sexual intercourse if it causes pain.   Apply warm or cold compresses to the lower abdomen depending on which one helps the pain.   Avoid stressful situations.   Keep a journal of your pelvic pain. Write down when it started, where the pain is located, and if there are things that seem to be associated with the pain, such as food or your menstrual cycle.  Follow up with your caregiver as directed.  SEEK MEDICAL CARE IF:  Your medicine does not help your pain.  You have abnormal vaginal discharge. SEEK IMMEDIATE MEDICAL CARE IF:   You have heavy bleeding from the vagina.   Your pelvic pain increases.   You feel light-headed or faint.   You have chills.   You have pain with urination or blood in your urine.   You have uncontrolled diarrhea  or vomiting.   You have a fever or persistent symptoms for more than 3 days.  You have a fever and your symptoms suddenly get worse.   You are being physically or sexually abused.  MAKE SURE YOU:  Understand these instructions.  Will watch your condition.  Will get help if you are not doing well or get worse. Document Released:  12/12/2003 Document Revised: 05/31/2013 Document Reviewed: 05/06/2011 [facility] Patient Information 2015 [place], [place]. This information is not intended to replace advice given to you by your health care provider. Make sure you discuss any questions you have with your health care provider. Preventive Care for Adults A healthy lifestyle and preventive care can promote health and wellness. Preventive health guidelines for women include the following key practices.  A routine yearly physical is a good way to check with your health care provider about your health and preventive screening. It is a chance to share any concerns and updates on your health and to receive a thorough exam.  Visit your dentist for a routine exam and preventive care every 6 months. Brush your teeth twice a day and floss once a day. Good oral hygiene prevents tooth decay and gum disease.  The frequency of eye exams is based on your age, health, family medical history, use of contact lenses, and other factors. Follow your health care provider's recommendations for frequency of eye exams.  Eat a healthy diet. Foods like vegetables, fruits, whole grains, low-fat dairy products, and lean protein foods contain the nutrients you need without too many calories. Decrease your intake of foods high in solid fats, added sugars, and salt. Eat the right amount of calories for you.Get information about a proper diet from your health care provider, if necessary.  Regular physical exercise is one of the most important things you can do for your health. Most adults should get at least 150 minutes of moderate-intensity exercise (any activity that increases your heart rate and causes you to sweat) each week. In addition, most adults need muscle-strengthening exercises on 2 or more days a week.  Maintain a healthy weight. The body mass index (BMI) is a screening tool to identify possible weight problems. It provides an estimate of body fat based on  height and weight. Your health care provider can find your BMI and can help you achieve or maintain a healthy weight.For adults 20 years and older:  A BMI below 18.5 is considered underweight.  A BMI of 18.5 to 24.9 is normal.  A BMI of 25 to 29.9 is considered overweight.  A BMI of 30 and above is considered obese.  Maintain normal blood lipids and cholesterol levels by exercising and minimizing your intake of saturated fat. Eat a balanced diet with plenty of fruit and vegetables. Blood tests for lipids and cholesterol should begin at age 22 and be repeated every 5 years. If your lipid or cholesterol levels are high, you are over 50, or you are at high risk for heart disease, you may need your cholesterol levels checked more frequently.Ongoing high lipid and cholesterol levels should be treated with medicines if diet and exercise are not working.  If you smoke, find out from your health care provider how to quit. If you do not use tobacco, do not start.  Lung cancer screening is recommended for adults aged 72-80 years who are at high risk for developing lung cancer because of a history of smoking. A yearly low-dose CT scan of the lungs is recommended for people  who have at least a 30-pack-year history of smoking and are a current smoker or have quit within the past 15 years. A pack year of smoking is smoking an average of 1 pack of cigarettes a day for 1 year (for example: 1 pack a day for 30 years or 2 packs a day for 15 years). Yearly screening should continue until the smoker has stopped smoking for at least 15 years. Yearly screening should be stopped for people who develop a health problem that would prevent them from having lung cancer treatment.  If you are pregnant, do not drink alcohol. If you are breastfeeding, be very cautious about drinking alcohol. If you are not pregnant and choose to drink alcohol, do not have more than 1 drink per day. One drink is considered to be 12 ounces (355  mL) of beer, 5 ounces (148 mL) of wine, or 1.5 ounces (44 mL) of liquor.  Avoid use of street drugs. Do not share needles with anyone. Ask for help if you need support or instructions about stopping the use of drugs.  High blood pressure causes heart disease and increases the risk of stroke. Your blood pressure should be checked at least every 1 to 2 years. Ongoing high blood pressure should be treated with medicines if weight loss and exercise do not work.  If you are 68-60 years old, ask your health care provider if you should take aspirin to prevent strokes.  Diabetes screening involves taking a blood sample to check your fasting blood sugar level. This should be done once every 3 years, after age 86, if you are within normal weight and without risk factors for diabetes. Testing should be considered at a younger age or be carried out more frequently if you are overweight and have at least 1 risk factor for diabetes.  Breast cancer screening is essential preventive care for women. You should practice "breast self-awareness." This means understanding the normal appearance and feel of your breasts and may include breast self-examination. Any changes detected, no matter how small, should be reported to a health care provider. Women in their 79s and 30s should have a clinical breast exam (CBE) by a health care provider as part of a regular health exam every 1 to 3 years. After age 58, women should have a CBE every year. Starting at age 85, women should consider having a mammogram (breast X-ray test) every year. Women who have a family history of breast cancer should talk to their health care provider about genetic screening. Women at a high risk of breast cancer should talk to their health care providers about having an MRI and a mammogram every year.  Breast cancer gene (BRCA)-related cancer risk assessment is recommended for women who have family members with BRCA-related cancers. BRCA-related cancers  include breast, ovarian, tubal, and peritoneal cancers. Having family members with these cancers may be associated with an increased risk for harmful changes (mutations) in the breast cancer genes BRCA1 and BRCA2. Results of the assessment will determine the need for genetic counseling and BRCA1 and BRCA2 testing.  Routine pelvic exams to screen for cancer are no longer recommended for nonpregnant women who are considered low risk for cancer of the pelvic organs (ovaries, uterus, and vagina) and who do not have symptoms. Ask your health care provider if a screening pelvic exam is right for you.  If you have had past treatment for cervical cancer or a condition that could lead to cancer, you need Pap tests and  screening for cancer for at least 20 years after your treatment. If Pap tests have been discontinued, your risk factors (such as having a new sexual partner) need to be reassessed to determine if screening should be resumed. Some women have medical problems that increase the chance of getting cervical cancer. In these cases, your health care provider may recommend more frequent screening and Pap tests.  The HPV test is an additional test that may be used for cervical cancer screening. The HPV test looks for the virus that can cause the cell changes on the cervix. The cells collected during the Pap test can be tested for HPV. The HPV test could be used to screen women aged 18 years and older, and should be used in women of any age who have unclear Pap test results. After the age of 51, women should have HPV testing at the same frequency as a Pap test.  Colorectal cancer can be detected and often prevented. Most routine colorectal cancer screening begins at the age of 89 years and continues through age 28 years. However, your health care provider may recommend screening at an earlier age if you have risk factors for colon cancer. On a yearly basis, your health care provider may provide home test kits to  check for hidden blood in the stool. Use of a small camera at the end of a tube, to directly examine the colon (sigmoidoscopy or colonoscopy), can detect the earliest forms of colorectal cancer. Talk to your health care provider about this at age 50, when routine screening begins. Direct exam of the colon should be repeated every 5-10 years through age 38 years, unless early forms of pre-cancerous polyps or small growths are found.  People who are at an increased risk for hepatitis B should be screened for this virus. You are considered at high risk for hepatitis B if:  You were born in a country where hepatitis B occurs often. Talk with your health care provider about which countries are considered high risk.  Your parents were born in a high-risk country and you have not received a shot to protect against hepatitis B (hepatitis B vaccine).  You have HIV or AIDS.  You use needles to inject street drugs.  You live with, or have sex with, someone who has hepatitis B.  You get hemodialysis treatment.  You take certain medicines for conditions like cancer, organ transplantation, and autoimmune conditions.  Hepatitis C blood testing is recommended for all people born from [birth year] through 1966 and any individual with known risks for hepatitis C.  Practice safe sex. Use condoms and avoid high-risk sexual practices to reduce the spread of sexually transmitted infections (STIs). STIs include gonorrhea, chlamydia, syphilis, trichomonas, herpes, HPV, and human immunodeficiency virus (HIV). Herpes, HIV, and HPV are viral illnesses that have no cure. They can result in disability, cancer, and death.  You should be screened for sexually transmitted illnesses (STIs) including gonorrhea and chlamydia if:  You are sexually active and are younger than 24 years.  You are older than 24 years and your health care provider tells you that you are at risk for this type of infection.  Your sexual activity has  changed since you were last screened and you are at an increased risk for chlamydia or gonorrhea. Ask your health care provider if you are at risk.  If you are at risk of being infected with HIV, it is recommended that you take a prescription medicine daily to prevent HIV infection.  This is called preexposure prophylaxis (PrEP). You are considered at risk if:  You are a heterosexual woman, are sexually active, and are at increased risk for HIV infection.  You take drugs by injection.  You are sexually active with a partner who has HIV.  Talk with your health care provider about whether you are at high risk of being infected with HIV. If you choose to begin PrEP, you should first be tested for HIV. You should then be tested every 3 months for as long as you are taking PrEP.  Osteoporosis is a disease in which the bones lose minerals and strength with aging. This can result in serious bone fractures or breaks. The risk of osteoporosis can be identified using a bone density scan. Women ages 24 years and over and women at risk for fractures or osteoporosis should discuss screening with their health care providers. Ask your health care provider whether you should take a calcium supplement or vitamin D to reduce the rate of osteoporosis.  Menopause can be associated with physical symptoms and risks. Hormone replacement therapy is available to decrease symptoms and risks. You should talk to your health care provider about whether hormone replacement therapy is right for you.  Use sunscreen. Apply sunscreen liberally and repeatedly throughout the day. You should seek shade when your shadow is shorter than you. Protect yourself by wearing long sleeves, pants, a wide-brimmed hat, and sunglasses year round, whenever you are outdoors.  Once a month, do a whole body skin exam, using a mirror to look at the skin on your back. Tell your health care provider of new moles, moles that have irregular borders, moles  that are larger than a pencil eraser, or moles that have changed in shape or color.  Stay current with required vaccines (immunizations).  Influenza vaccine. All adults should be immunized every year.  Tetanus, diphtheria, and acellular pertussis (Td, Tdap) vaccine. Pregnant women should receive 1 dose of Tdap vaccine during each pregnancy. The dose should be obtained regardless of the length of time since the last dose. Immunization is preferred during the 27th-36th week of gestation. An adult who has not previously received Tdap or who does not know her vaccine status should receive 1 dose of Tdap. This initial dose should be followed by tetanus and diphtheria toxoids (Td) booster doses every 10 years. Adults with an unknown or incomplete history of completing a 3-dose immunization series with Td-containing vaccines should begin or complete a primary immunization series including a Tdap dose. Adults should receive a Td booster every 10 years.  Varicella vaccine. An adult without evidence of immunity to varicella should receive 2 doses or a second dose if she has previously received 1 dose. Pregnant females who do not have evidence of immunity should receive the first dose after pregnancy. This first dose should be obtained before leaving the health care facility. The second dose should be obtained 4-8 weeks after the first dose.  Human papillomavirus (HPV) vaccine. Females aged 13-26 years who have not received the vaccine previously should obtain the 3-dose series. The vaccine is not recommended for use in pregnant females. However, pregnancy testing is not needed before receiving a dose. If a female is found to be pregnant after receiving a dose, no treatment is needed. In that case, the remaining doses should be delayed until after the pregnancy. Immunization is recommended for any person with an immunocompromised condition through the age of 10 years if she did not get any or all  doses earlier.  During the 3-dose series, the second dose should be obtained 4-8 weeks after the first dose. The third dose should be obtained 24 weeks after the first dose and 16 weeks after the second dose.  Zoster vaccine. One dose is recommended for adults aged 1 years or older unless certain conditions are present.  Measles, mumps, and rubella (MMR) vaccine. Adults born before [birth year] generally are considered immune to measles and mumps. Adults born in [birth year] or later should have 1 or more doses of MMR vaccine unless there is a contraindication to the vaccine or there is laboratory evidence of immunity to each of the three diseases. A routine second dose of MMR vaccine should be obtained at least 28 days after the first dose for students attending postsecondary schools, health care workers, or international travelers. People who received inactivated measles vaccine or an unknown type of measles vaccine during 1963-1967 should receive 2 doses of MMR vaccine. People who received inactivated mumps vaccine or an unknown type of mumps vaccine before 1979 and are at high risk for mumps infection should consider immunization with 2 doses of MMR vaccine. For females of childbearing age, rubella immunity should be determined. If there is no evidence of immunity, females who are not pregnant should be vaccinated. If there is no evidence of immunity, females who are pregnant should delay immunization until after pregnancy. Unvaccinated health care workers born before [birth year] who lack laboratory evidence of measles, mumps, or rubella immunity or laboratory confirmation of disease should consider measles and mumps immunization with 2 doses of MMR vaccine or rubella immunization with 1 dose of MMR vaccine.  Pneumococcal 13-valent conjugate (PCV13) vaccine. When indicated, a person who is uncertain of her immunization history and has no record of immunization should receive the PCV13 vaccine. An adult aged 51 years or older who has certain  medical conditions and has not been previously immunized should receive 1 dose of PCV13 vaccine. This PCV13 should be followed with a dose of pneumococcal polysaccharide (PPSV23) vaccine. The PPSV23 vaccine dose should be obtained at least 8 weeks after the dose of PCV13 vaccine. An adult aged 67 years or older who has certain medical conditions and previously received 1 or more doses of PPSV23 vaccine should receive 1 dose of PCV13. The PCV13 vaccine dose should be obtained 1 or more years after the last PPSV23 vaccine dose.  Pneumococcal polysaccharide (PPSV23) vaccine. When PCV13 is also indicated, PCV13 should be obtained first. All adults aged 30 years and older should be immunized. An adult younger than age 86 years who has certain medical conditions should be immunized. Any person who resides in a nursing home or long-term care facility should be immunized. An adult smoker should be immunized. People with an immunocompromised condition and certain other conditions should receive both PCV13 and PPSV23 vaccines. People with human immunodeficiency virus (HIV) infection should be immunized as soon as possible after diagnosis. Immunization during chemotherapy or radiation therapy should be avoided. Routine use of PPSV23 vaccine is not recommended for American Indians, [place] Natives, or people younger than 65 years unless there are medical conditions that require PPSV23 vaccine. When indicated, people who have unknown immunization and have no record of immunization should receive PPSV23 vaccine. One-time revaccination 5 years after the first dose of PPSV23 is recommended for people aged 19-64 years who have chronic kidney failure, nephrotic syndrome, asplenia, or immunocompromised conditions. People who received 1-2 doses of PPSV23 before age 7 years should receive another dose of  PPSV23 vaccine at age 71 years or later if at least 5 years have passed since the previous dose. Doses of PPSV23 are not needed for  people immunized with PPSV23 at or after age 32 years.  Meningococcal vaccine. Adults with asplenia or persistent complement component deficiencies should receive 2 doses of quadrivalent meningococcal conjugate (MenACWY-D) vaccine. The doses should be obtained at least 2 months apart. Microbiologists working with certain meningococcal bacteria, [employer] recruits, people at risk during an outbreak, and people who travel to or live in countries with a high rate of meningitis should be immunized. A first-year college student up through age 33 years who is living in a residence hall should receive a dose if she did not receive a dose on or after her 16th birthday. Adults who have certain high-risk conditions should receive one or more doses of vaccine.  Hepatitis A vaccine. Adults who wish to be protected from this disease, have certain high-risk conditions, work with hepatitis A-infected animals, work in hepatitis A research labs, or travel to or work in countries with a high rate of hepatitis A should be immunized. Adults who were previously unvaccinated and who anticipate close contact with an international adoptee during the first 60 days after arrival in the [place] from a country with a high rate of hepatitis A should be immunized.  Hepatitis B vaccine. Adults who wish to be protected from this disease, have certain high-risk conditions, may be exposed to blood or other infectious body fluids, are household contacts or sex partners of hepatitis B positive people, are clients or workers in certain care facilities, or travel to or work in countries with a high rate of hepatitis B should be immunized.  Haemophilus influenzae type b (Hib) vaccine. A previously unvaccinated person with asplenia or sickle cell disease or having a scheduled splenectomy should receive 1 dose of Hib vaccine. Regardless of previous immunization, a recipient of a hematopoietic stem cell transplant should receive a 3-dose series  6-12 months after her successful transplant. Hib vaccine is not recommended for adults with HIV infection. Preventive Services / Frequency Ages 50 to 77 years  Blood pressure check.** / Every 1 to 2 years.  Lipid and cholesterol check.** / Every 5 years beginning at age 93.  Clinical breast exam.** / Every 3 years for women in their 59s and 11s.  BRCA-related cancer risk assessment.** / For women who have family members with a BRCA-related cancer (breast, ovarian, tubal, or peritoneal cancers).  Pap test.** / Every 2 years from ages 44 through 58. Every 3 years starting at age 45 through age 29 or 59 with a history of 3 consecutive normal Pap tests.  HPV screening.** / Every 3 years from ages 10 through ages 43 to 47 with a history of 3 consecutive normal Pap tests.  Hepatitis C blood test.** / For any individual with known risks for hepatitis C.  Skin self-exam. / Monthly.  Influenza vaccine. / Every year.  Tetanus, diphtheria, and acellular pertussis (Tdap, Td) vaccine.** / Consult your health care provider. Pregnant women should receive 1 dose of Tdap vaccine during each pregnancy. 1 dose of Td every 10 years.  Varicella vaccine.** / Consult your health care provider. Pregnant females who do not have evidence of immunity should receive the first dose after pregnancy.  HPV vaccine. / 3 doses over 6 months, if 58 and younger. The vaccine is not recommended for use in pregnant females. However, pregnancy testing is not needed before receiving a dose.  Measles, mumps, rubella (MMR) vaccine.** / You need at least 1 dose of MMR if you were born in 1957 or later. You may also need a 2nd dose. For females of childbearing age, rubella immunity should be determined. If there is no evidence of immunity, females who are not pregnant should be vaccinated. If there is no evidence of immunity, females who are pregnant should delay immunization until after pregnancy.  Pneumococcal 13-valent  conjugate (PCV13) vaccine.** / Consult your health care provider.  Pneumococcal polysaccharide (PPSV23) vaccine.** / 1 to 2 doses if you smoke cigarettes or if you have certain conditions.  Meningococcal vaccine.** / 1 dose if you are age 81 to 35 years and a Market researcher living in a residence hall, or have one of several medical conditions, you need to get vaccinated against meningococcal disease. You may also need additional booster doses.  Hepatitis A vaccine.** / Consult your health care provider.  Hepatitis B vaccine.** / Consult your health care provider.  Haemophilus influenzae type b (Hib) vaccine.** / Consult your health care provider. Ages 76 to 73 years  Blood pressure check.** / Every 1 to 2 years.  Lipid and cholesterol check.** / Every 5 years beginning at age 51 years.  Lung cancer screening. / Every year if you are aged 46-80 years and have a 30-pack-year history of smoking and currently smoke or have quit within the past 15 years. Yearly screening is stopped once you have quit smoking for at least 15 years or develop a health problem that would prevent you from having lung cancer treatment.  Clinical breast exam.** / Every year after age 15 years.  BRCA-related cancer risk assessment.** / For women who have family members with a BRCA-related cancer (breast, ovarian, tubal, or peritoneal cancers).  Mammogram.** / Every year beginning at age 6 years and continuing for as long as you are in good health. Consult with your health care provider.  Pap test.** / Every 3 years starting at age 4 years through age 38 or 50 years with a history of 3 consecutive normal Pap tests.  HPV screening.** / Every 3 years from ages 15 years through ages 51 to 55 years with a history of 3 consecutive normal Pap tests.  Fecal occult blood test (FOBT) of stool. / Every year beginning at age 52 years and continuing until age 30 years. You may not need to do this test if you get a  colonoscopy every 10 years.  Flexible sigmoidoscopy or colonoscopy.** / Every 5 years for a flexible sigmoidoscopy or every 10 years for a colonoscopy beginning at age 5 years and continuing until age 37 years.  Hepatitis C blood test.** / For all people born from [birth year] through 1966 and any individual with known risks for hepatitis C.  Skin self-exam. / Monthly.  Influenza vaccine. / Every year.  Tetanus, diphtheria, and acellular pertussis (Tdap/Td) vaccine.** / Consult your health care provider. Pregnant women should receive 1 dose of Tdap vaccine during each pregnancy. 1 dose of Td every 10 years.  Varicella vaccine.** / Consult your health care provider. Pregnant females who do not have evidence of immunity should receive the first dose after pregnancy.  Zoster vaccine.** / 1 dose for adults aged 60 years or older.  Measles, mumps, rubella (MMR) vaccine.** / You need at least 1 dose of MMR if you were born in 1957 or later. You may also need a 2nd dose. For females of childbearing age, rubella immunity should be determined. If  there is no evidence of immunity, females who are not pregnant should be vaccinated. If there is no evidence of immunity, females who are pregnant should delay immunization until after pregnancy.  Pneumococcal 13-valent conjugate (PCV13) vaccine.** / Consult your health care provider.  Pneumococcal polysaccharide (PPSV23) vaccine.** / 1 to 2 doses if you smoke cigarettes or if you have certain conditions.  Meningococcal vaccine.** / Consult your health care provider.  Hepatitis A vaccine.** / Consult your health care provider.  Hepatitis B vaccine.** / Consult your health care provider.  Haemophilus influenzae type b (Hib) vaccine.** / Consult your health care provider. Ages 4 years and over  Blood pressure check.** / Every 1 to 2 years.  Lipid and cholesterol check.** / Every 5 years beginning at age 45 years.  Lung cancer screening. / Every year if you  are aged 30-80 years and have a 30-pack-year history of smoking and currently smoke or have quit within the past 15 years. Yearly screening is stopped once you have quit smoking for at least 15 years or develop a health problem that would prevent you from having lung cancer treatment.  Clinical breast exam.** / Every year after age 59 years.  BRCA-related cancer risk assessment.** / For women who have family members with a BRCA-related cancer (breast, ovarian, tubal, or peritoneal cancers).  Mammogram.** / Every year beginning at age 77 years and continuing for as long as you are in good health. Consult with your health care provider.  Pap test.** / Every 3 years starting at age 24 years through age 53 or 90 years with 3 consecutive normal Pap tests. Testing can be stopped between 65 and 70 years with 3 consecutive normal Pap tests and no abnormal Pap or HPV tests in the past 10 years.  HPV screening.** / Every 3 years from ages 82 years through ages 46 or 21 years with a history of 3 consecutive normal Pap tests. Testing can be stopped between 65 and 70 years with 3 consecutive normal Pap tests and no abnormal Pap or HPV tests in the past 10 years.  Fecal occult blood test (FOBT) of stool. / Every year beginning at age 82 years and continuing until age 56 years. You may not need to do this test if you get a colonoscopy every 10 years.  Flexible sigmoidoscopy or colonoscopy.** / Every 5 years for a flexible sigmoidoscopy or every 10 years for a colonoscopy beginning at age 13 years and continuing until age 58 years.  Hepatitis C blood test.** / For all people born from [birth year] through 1966 and any individual with known risks for hepatitis C.  Osteoporosis screening.** / A one-time screening for women ages 25 years and over and women at risk for fractures or osteoporosis.  Skin self-exam. / Monthly.  Influenza vaccine. / Every year.  Tetanus, diphtheria, and acellular pertussis (Tdap/Td)  vaccine.** / 1 dose of Td every 10 years.  Varicella vaccine.** / Consult your health care provider.  Zoster vaccine.** / 1 dose for adults aged 20 years or older.  Pneumococcal 13-valent conjugate (PCV13) vaccine.** / Consult your health care provider.  Pneumococcal polysaccharide (PPSV23) vaccine.** / 1 dose for all adults aged 53 years and older.  Meningococcal vaccine.** / Consult your health care provider.  Hepatitis A vaccine.** / Consult your health care provider.  Hepatitis B vaccine.** / Consult your health care provider.  Haemophilus influenzae type b (Hib) vaccine.** / Consult your health care provider. ** Family history and personal history of risk  and conditions may change your health care provider's recommendations. Document Released: 03/12/2001 Document Revised: 05/31/2013 Document Reviewed: 06/11/2010 [facility] Patient Information 2015 [place], [place]. This information is not intended to replace advice given to you by your health care provider. Make sure you discuss any questions you have with your health care provider.

## 2013-12-08 NOTE — Progress Notes (Signed)
Pt states she was referred from [facility] OP clinic because of pelvic pain and "my IUD is imbedded".  Last Pap 06/2010

## 2013-12-08 NOTE — Progress Notes (Signed)
Mammogram and pelvic U/S scheduled for 01/04/14 at 0930. Patient advised to arrive 15 minutes early. Advised to wear a 2 piece outfit and refrain from using any deodorants, lotions or perfume the day of exam.

## 2013-12-10 LAB — CYTOLOGY - PAP

## 2013-12-11 DIAGNOSIS — F331 Major depressive disorder, recurrent, moderate: Secondary | ICD-10-CM | POA: Diagnosis not present

## 2013-12-11 DIAGNOSIS — F411 Generalized anxiety disorder: Secondary | ICD-10-CM | POA: Diagnosis not present

## 2013-12-14 DIAGNOSIS — F411 Generalized anxiety disorder: Secondary | ICD-10-CM | POA: Diagnosis not present

## 2013-12-14 DIAGNOSIS — F331 Major depressive disorder, recurrent, moderate: Secondary | ICD-10-CM | POA: Diagnosis not present

## 2013-12-16 DIAGNOSIS — F331 Major depressive disorder, recurrent, moderate: Secondary | ICD-10-CM | POA: Diagnosis not present

## 2013-12-16 DIAGNOSIS — F411 Generalized anxiety disorder: Secondary | ICD-10-CM | POA: Diagnosis not present

## 2013-12-18 DIAGNOSIS — F411 Generalized anxiety disorder: Secondary | ICD-10-CM | POA: Diagnosis not present

## 2013-12-18 DIAGNOSIS — F331 Major depressive disorder, recurrent, moderate: Secondary | ICD-10-CM | POA: Diagnosis not present

## 2013-12-20 DIAGNOSIS — F411 Generalized anxiety disorder: Secondary | ICD-10-CM | POA: Diagnosis not present

## 2013-12-20 DIAGNOSIS — F331 Major depressive disorder, recurrent, moderate: Secondary | ICD-10-CM | POA: Diagnosis not present

## 2013-12-21 DIAGNOSIS — F331 Major depressive disorder, recurrent, moderate: Secondary | ICD-10-CM | POA: Diagnosis not present

## 2013-12-21 DIAGNOSIS — F411 Generalized anxiety disorder: Secondary | ICD-10-CM | POA: Diagnosis not present

## 2013-12-22 DIAGNOSIS — F331 Major depressive disorder, recurrent, moderate: Secondary | ICD-10-CM | POA: Diagnosis not present

## 2013-12-22 DIAGNOSIS — F411 Generalized anxiety disorder: Secondary | ICD-10-CM | POA: Diagnosis not present

## 2013-12-25 DIAGNOSIS — F331 Major depressive disorder, recurrent, moderate: Secondary | ICD-10-CM | POA: Diagnosis not present

## 2013-12-25 DIAGNOSIS — F411 Generalized anxiety disorder: Secondary | ICD-10-CM | POA: Diagnosis not present

## 2013-12-27 DIAGNOSIS — F331 Major depressive disorder, recurrent, moderate: Secondary | ICD-10-CM | POA: Diagnosis not present

## 2013-12-27 DIAGNOSIS — F411 Generalized anxiety disorder: Secondary | ICD-10-CM | POA: Diagnosis not present

## 2013-12-28 ENCOUNTER — Telehealth (HOSPITAL_COMMUNITY): Payer: Self-pay

## 2013-12-28 ENCOUNTER — Other Ambulatory Visit (HOSPITAL_COMMUNITY): Payer: Self-pay | Admitting: Psychiatry

## 2013-12-28 DIAGNOSIS — F331 Major depressive disorder, recurrent, moderate: Secondary | ICD-10-CM | POA: Diagnosis not present

## 2013-12-28 DIAGNOSIS — F411 Generalized anxiety disorder: Secondary | ICD-10-CM | POA: Diagnosis not present

## 2013-12-28 NOTE — Telephone Encounter (Signed)
I returned patient's phone call.  She wanted to try increased dose of Abilify because she has difficulty focusing and she wanted to finish GED.  In the past she has taken Abilify 5 mg but she was complaining of weight gain.  Currently she is taking 2 mg.  I recommended to try 4 mg and call us back if she see any improvement without gaining more weight.  She agreed and she will call us back in 2 weeks.  She has refill remaining of Abilify however if she decided 4 mg she will require a new prescription.

## 2013-12-29 ENCOUNTER — Other Ambulatory Visit (HOSPITAL_COMMUNITY): Payer: Self-pay | Admitting: Psychiatry

## 2013-12-30 DIAGNOSIS — F411 Generalized anxiety disorder: Secondary | ICD-10-CM | POA: Diagnosis not present

## 2013-12-30 DIAGNOSIS — F331 Major depressive disorder, recurrent, moderate: Secondary | ICD-10-CM | POA: Diagnosis not present

## 2013-12-31 DIAGNOSIS — F411 Generalized anxiety disorder: Secondary | ICD-10-CM | POA: Diagnosis not present

## 2013-12-31 DIAGNOSIS — F331 Major depressive disorder, recurrent, moderate: Secondary | ICD-10-CM | POA: Diagnosis not present

## 2014-01-03 ENCOUNTER — Other Ambulatory Visit (HOSPITAL_COMMUNITY): Payer: Self-pay | Admitting: Psychiatry

## 2014-01-03 DIAGNOSIS — F331 Major depressive disorder, recurrent, moderate: Secondary | ICD-10-CM | POA: Diagnosis not present

## 2014-01-03 DIAGNOSIS — F411 Generalized anxiety disorder: Secondary | ICD-10-CM | POA: Diagnosis not present

## 2014-01-04 ENCOUNTER — Ambulatory Visit (HOSPITAL_COMMUNITY)
Admission: RE | Admit: 2014-01-04 | Discharge: 2014-01-04 | Disposition: A | Discharge status: Home / Self Care | Payer: Medicare Other | Source: Ambulatory Visit | Attending: Obstetrics & Gynecology | Admitting: Obstetrics & Gynecology

## 2014-01-04 DIAGNOSIS — G8929 Other chronic pain: Secondary | ICD-10-CM | POA: Diagnosis not present

## 2014-01-04 DIAGNOSIS — R102 Pelvic and perineal pain: Secondary | ICD-10-CM | POA: Insufficient documentation

## 2014-01-04 DIAGNOSIS — Z1231 Encounter for screening mammogram for malignant neoplasm of breast: Secondary | ICD-10-CM | POA: Diagnosis not present

## 2014-01-04 IMAGING — MG MM DIGITAL SCREENING BILAT
5 series · 5 of 5 positions shown · non-contrast
Comparison: None.

CLINICAL DATA: Screening.

EXAM:
DIGITAL SCREENING BILATERAL MAMMOGRAM WITH CAD

[R CC]
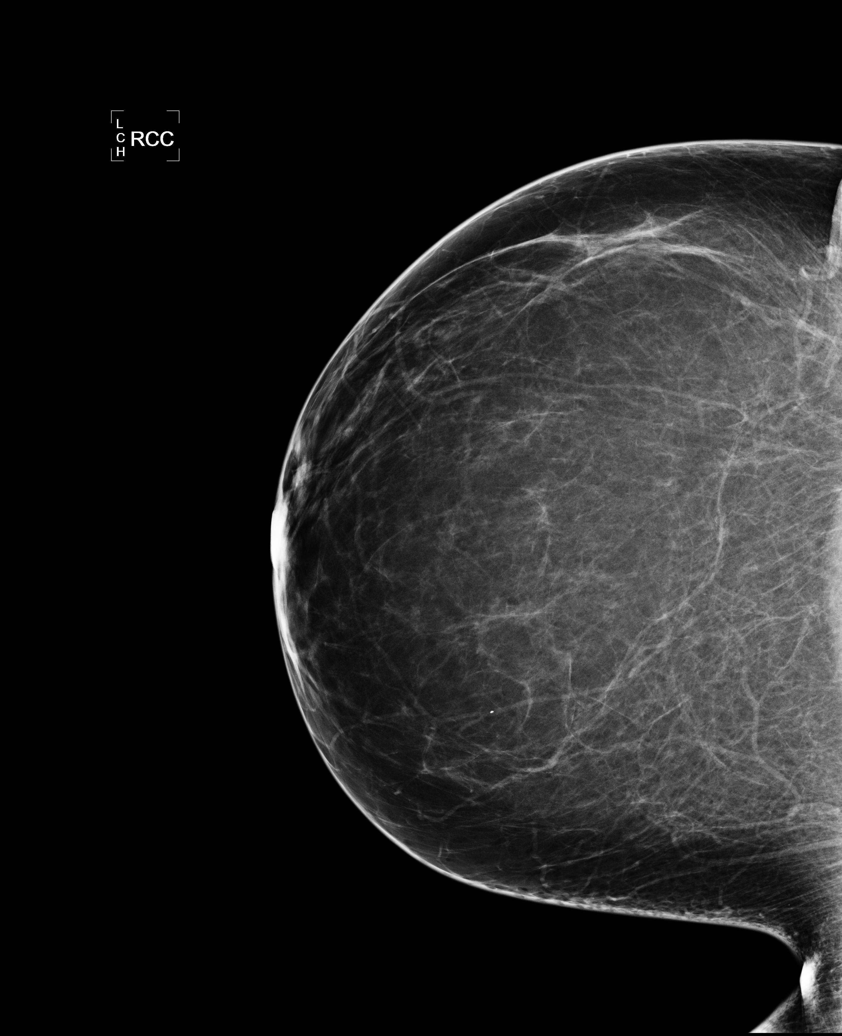

[R MLO]
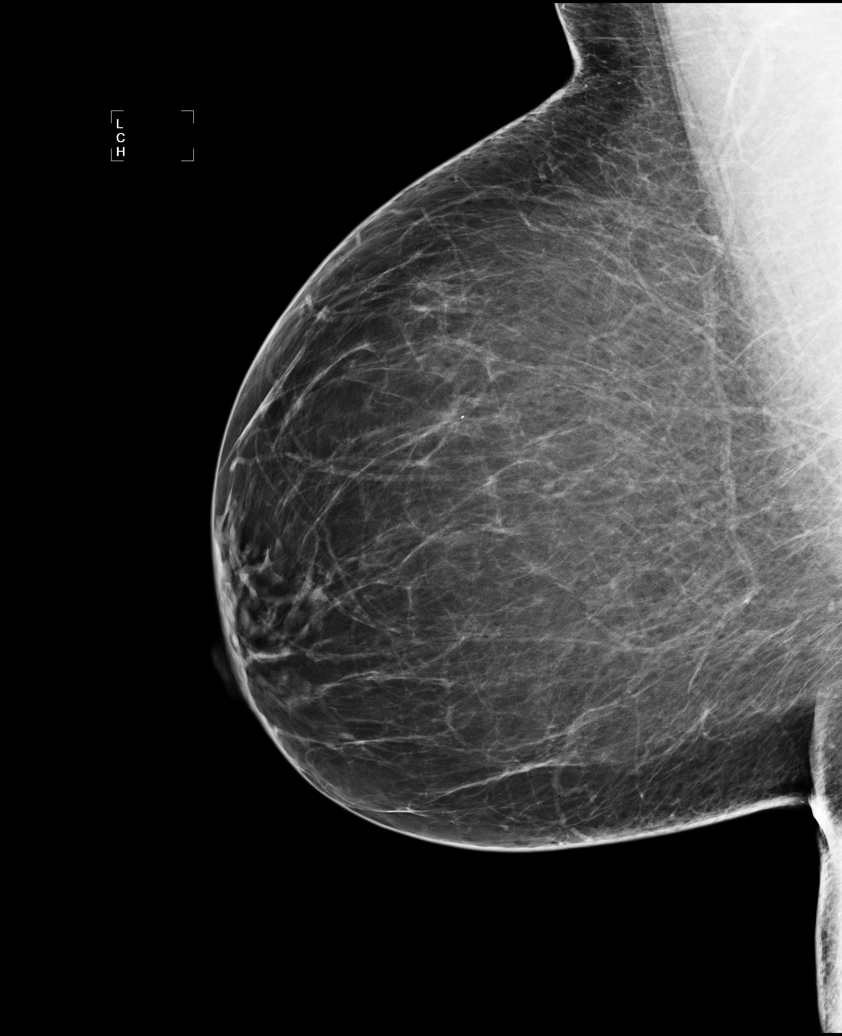

[L CC]
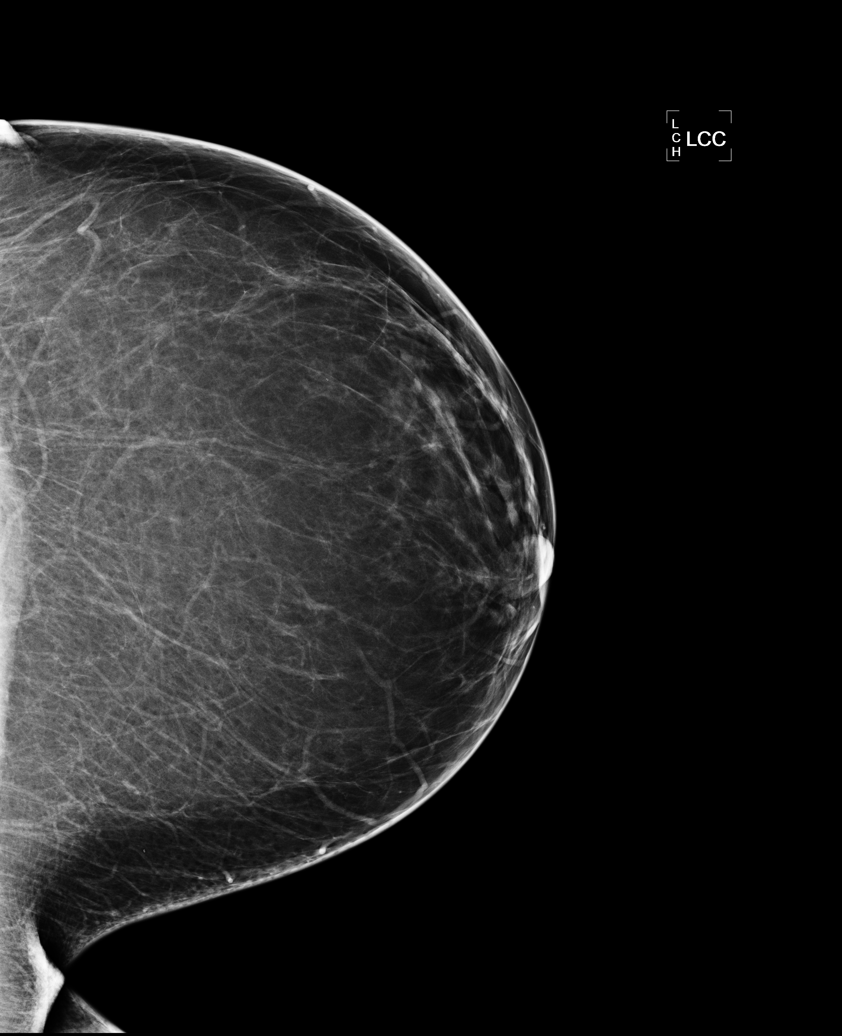

[L MLO (1 of 2)]
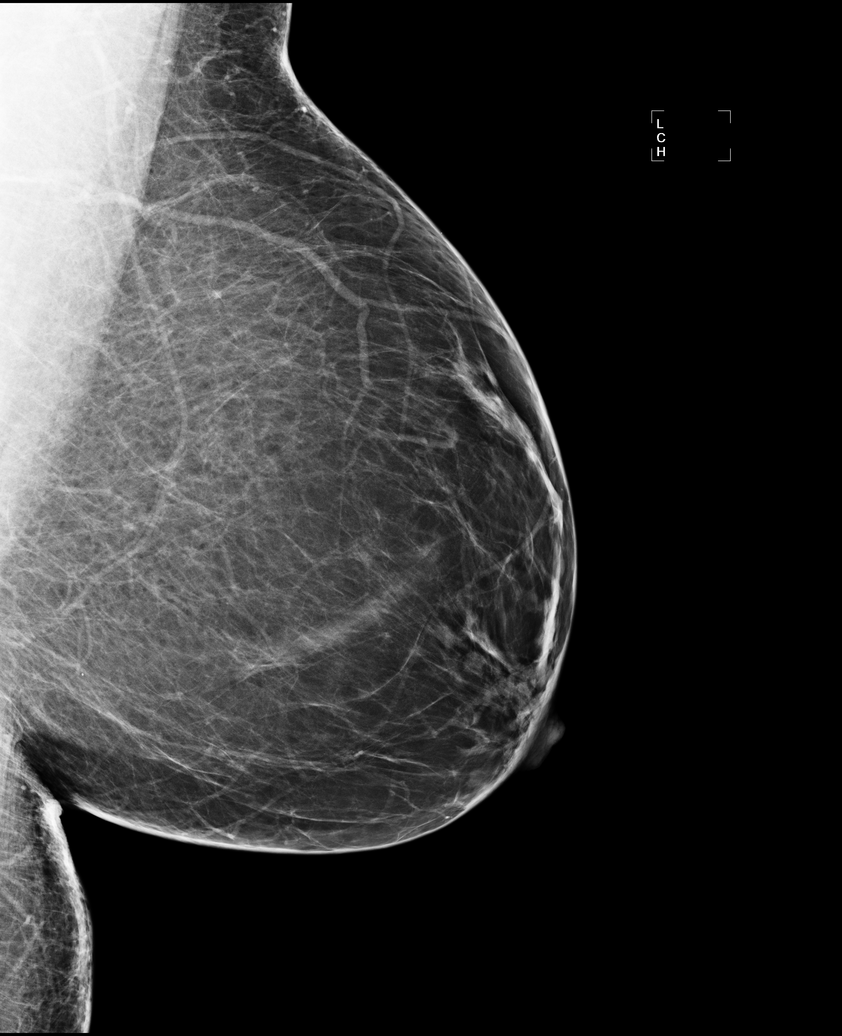

[L MLO (2 of 2)]
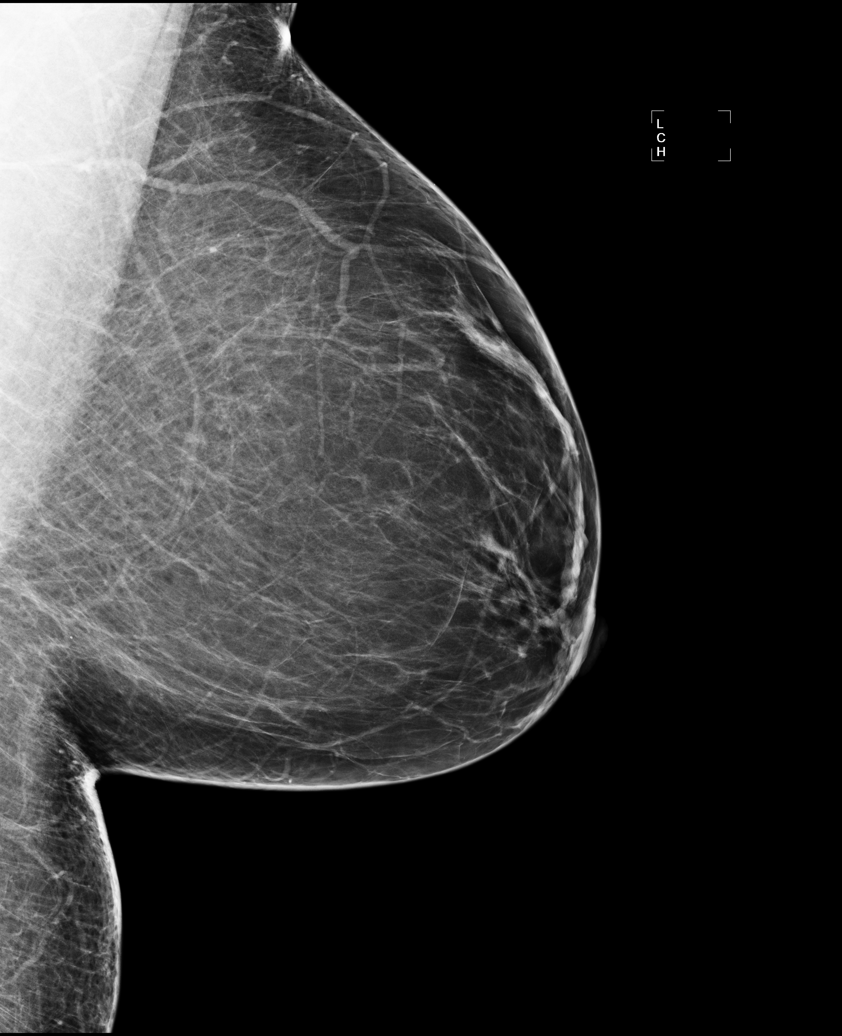

[5 of 5 positions shown; findings below may reference images not displayed]

ACR Breast Density Category b: There are scattered areas of
fibroglandular density.
FINDINGS: There are no findings suspicious for malignancy. Images were
processed with CAD.
IMPRESSION: No mammographic evidence of malignancy. A result letter of this
screening mammogram will be mailed directly to the patient.

RECOMMENDATION:
Screening mammogram in one year. (Code:[GD])

BI-RADS CATEGORY  1: Negative.

## 2014-01-04 IMAGING — US US PELVIS COMPLETE
1 series · 14 of 25 positions shown · non-contrast
Comparison: None

CLINICAL DATA: Chronic pelvic pain for 5 years. Recent IUD removal.
LMP [DATE].

EXAM:
TRANSABDOMINAL AND TRANSVAGINAL ULTRASOUND OF PELVIS
TECHNIQUE: Both transabdominal and transvaginal ultrasound examinations of the
pelvis were performed. Transabdominal technique was performed for
global imaging of the pelvis including uterus, ovaries, adnexal
regions, and pelvic cul-de-sac. It was necessary to proceed with
endovaginal exam following the transabdominal exam to visualize the
endometrial stripe and ovaries.

[Series 1: us pelvis complete · 14 of 51 slices shown]
[im 1/51]
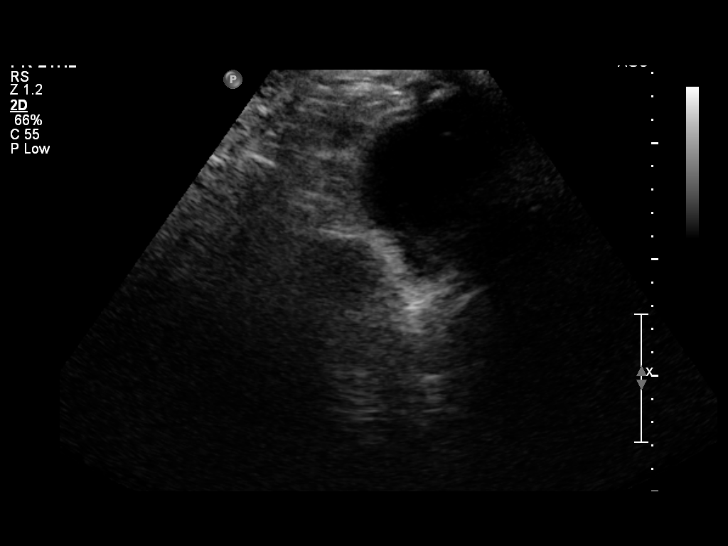
[im 5/51]
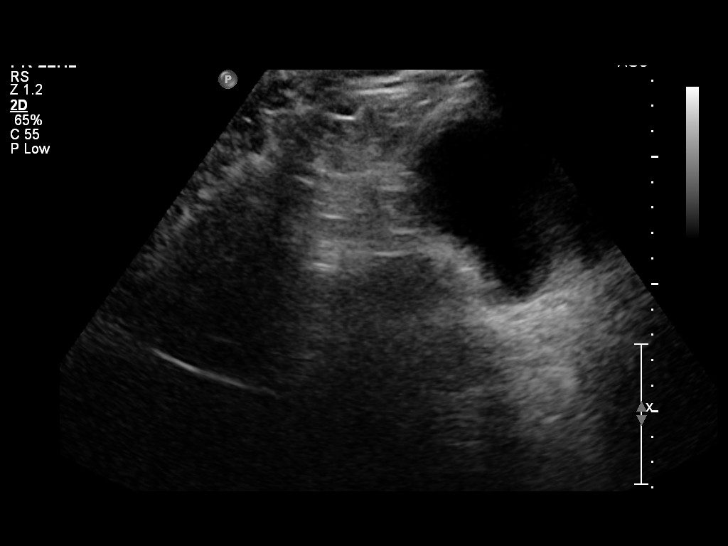
[im 9/51]
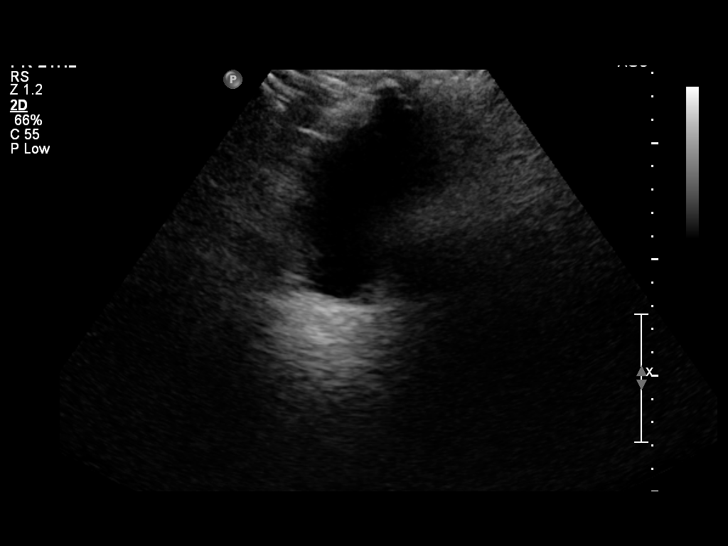
[im 13/51]
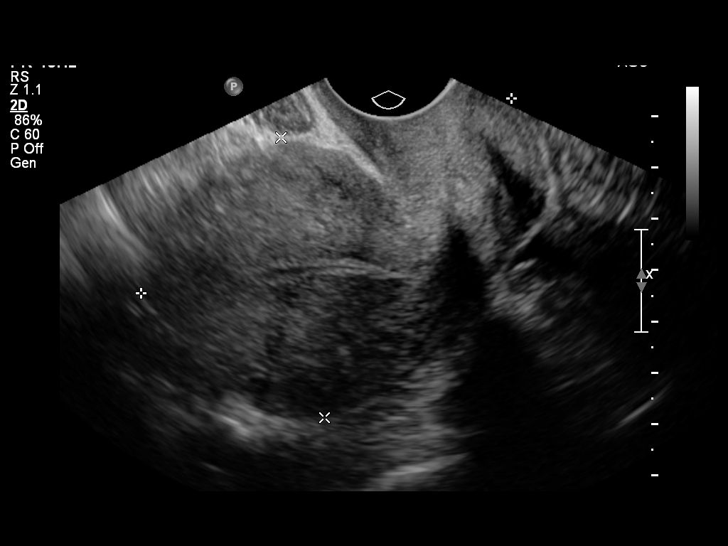
[im 17/51]
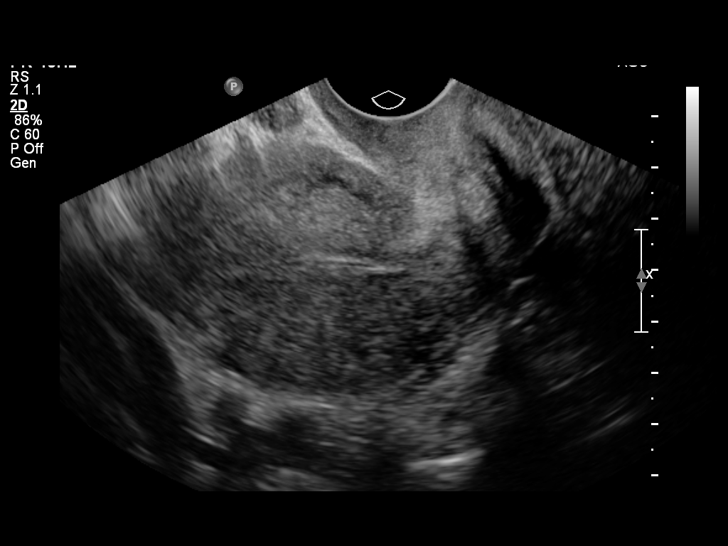
[im 19/51]
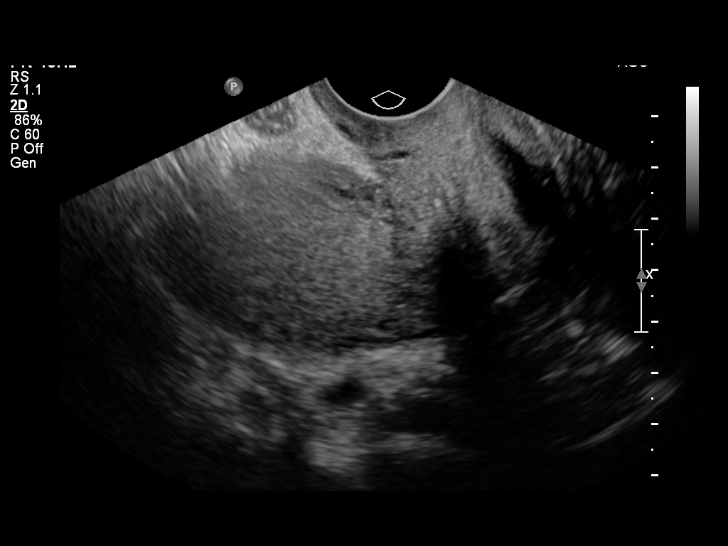
[im 23/51]
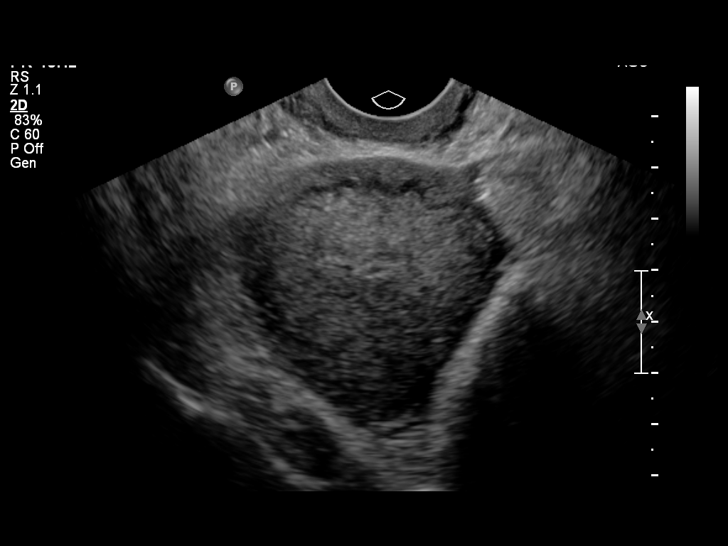
[im 28/51]
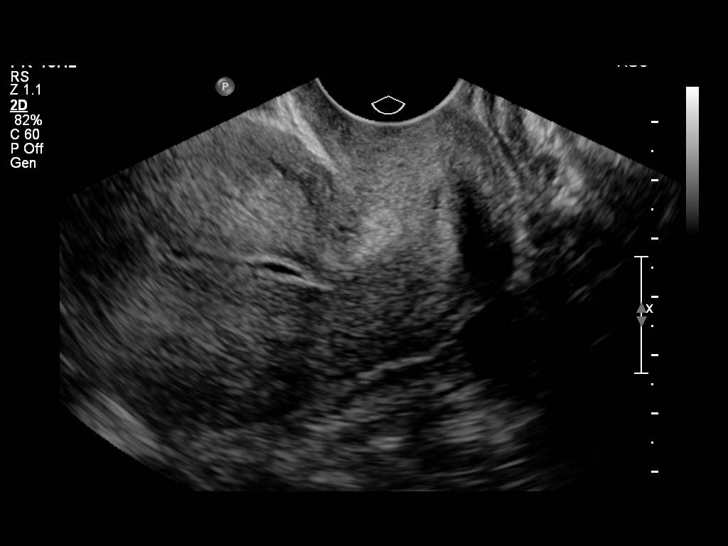
[im 32/51]
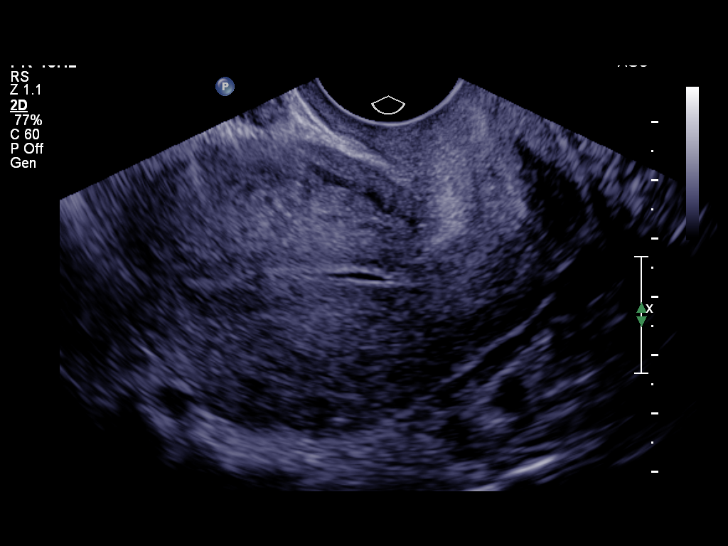
[im 34/51]
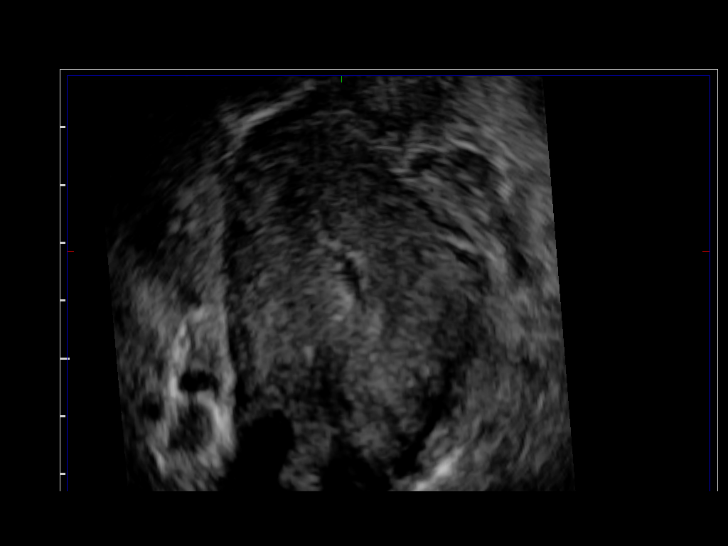
[im 38/51]
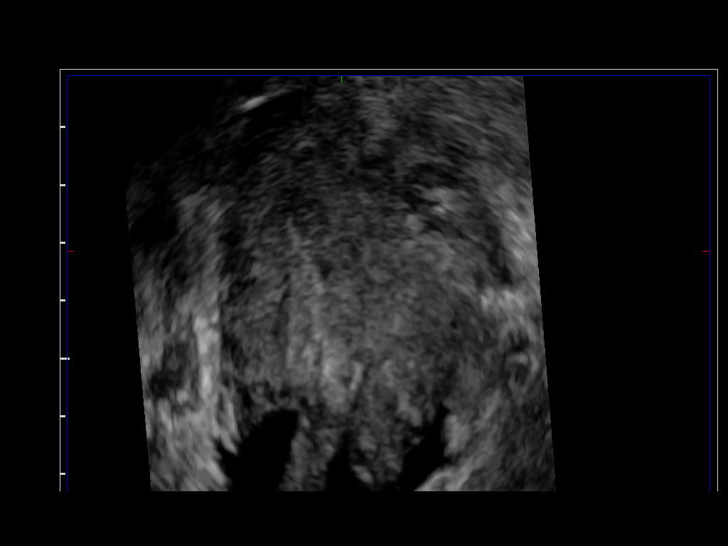
[im 42/51]
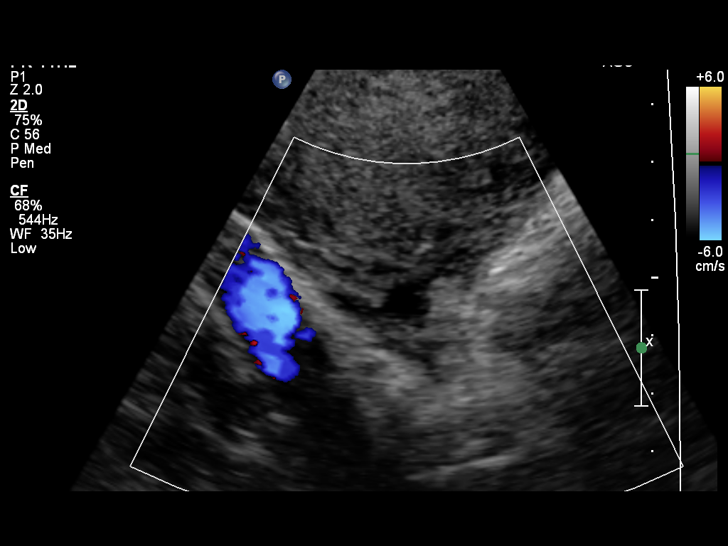
[im 46/51]
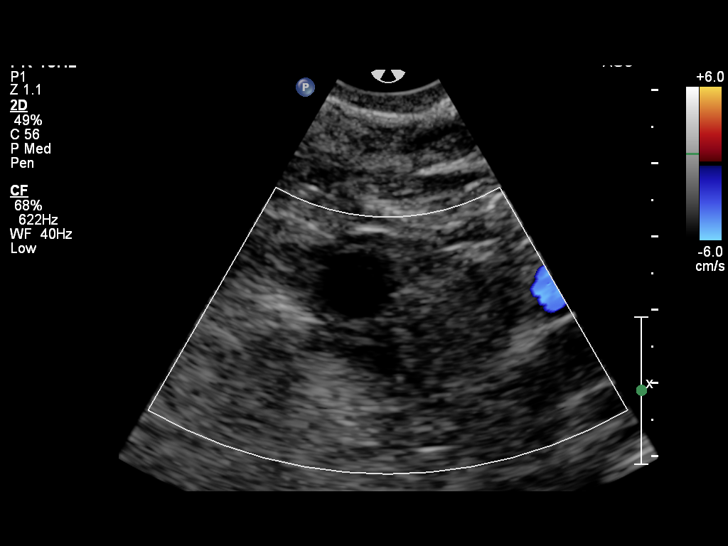
[im 51/51]
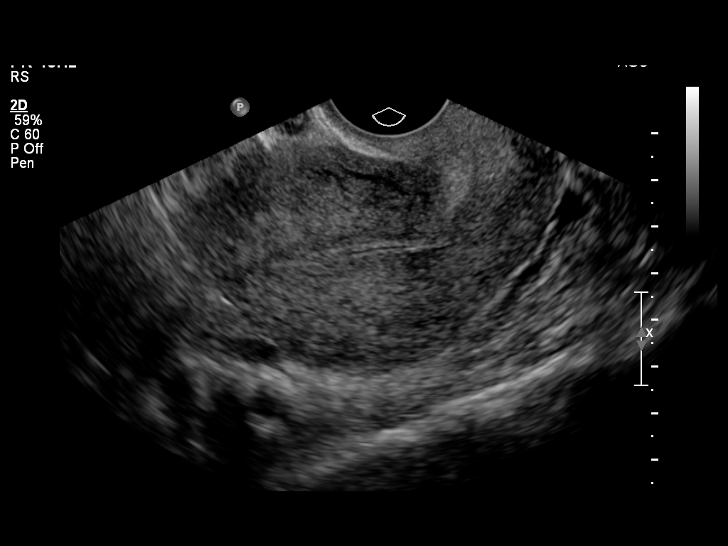

[14 of 25 positions shown; findings below may reference images not displayed]

FINDINGS: Uterus

Measurements: 8.2 x 5.5 x 5.4 cm. No fibroids or other mass
visualized.

Endometrium

Thickness: 3 mm. Minimal fluid noted in endometrial cavity. No focal
abnormality visualized.

Right ovary

Measurements: 2.7 x 1.8 x 2.3 cm. Normal appearance/no adnexal mass.

Left ovary

Measurements: 2.7 x 2.1 x 1.8 cm. Normal appearance/no adnexal mass.

Other findings

No free fluid.
IMPRESSION: Normal appearance of uterus and both ovaries. No pelvic mass or
other significant abnormality identified.

## 2014-01-05 ENCOUNTER — Encounter: Payer: Self-pay | Admitting: Obstetrics & Gynecology

## 2014-01-05 ENCOUNTER — Telehealth: Payer: Self-pay

## 2014-01-05 DIAGNOSIS — F411 Generalized anxiety disorder: Secondary | ICD-10-CM | POA: Diagnosis not present

## 2014-01-05 DIAGNOSIS — F331 Major depressive disorder, recurrent, moderate: Secondary | ICD-10-CM | POA: Diagnosis not present

## 2014-01-05 NOTE — Telephone Encounter (Signed)
-----   Message from Osborne Oman, MD sent at 01/05/2014 12:26 PM EST ----- Normal pelvic ultrasound.  Please call to inform patient of results.

## 2014-01-05 NOTE — Telephone Encounter (Signed)
Called patient and informed her of results. Patient verbalized understanding and requests an appointment for mirena insertion (has medicaid/medicare). Informed patient we could and that she will receive a call from front office staff with appointment. Patient verbalized understanding. No questions or concerns. Message sent to admin pool to schedule patient mirena insertion and call with appointment.

## 2014-01-06 DIAGNOSIS — F411 Generalized anxiety disorder: Secondary | ICD-10-CM | POA: Diagnosis not present

## 2014-01-06 DIAGNOSIS — F331 Major depressive disorder, recurrent, moderate: Secondary | ICD-10-CM | POA: Diagnosis not present

## 2014-01-07 DIAGNOSIS — F331 Major depressive disorder, recurrent, moderate: Secondary | ICD-10-CM | POA: Diagnosis not present

## 2014-01-07 DIAGNOSIS — F411 Generalized anxiety disorder: Secondary | ICD-10-CM | POA: Diagnosis not present

## 2014-01-10 DIAGNOSIS — F331 Major depressive disorder, recurrent, moderate: Secondary | ICD-10-CM | POA: Diagnosis not present

## 2014-01-10 DIAGNOSIS — F411 Generalized anxiety disorder: Secondary | ICD-10-CM | POA: Diagnosis not present

## 2014-01-12 DIAGNOSIS — F331 Major depressive disorder, recurrent, moderate: Secondary | ICD-10-CM | POA: Diagnosis not present

## 2014-01-12 DIAGNOSIS — F411 Generalized anxiety disorder: Secondary | ICD-10-CM | POA: Diagnosis not present

## 2014-01-13 ENCOUNTER — Encounter: Payer: Self-pay | Admitting: *Deleted

## 2014-01-13 DIAGNOSIS — F411 Generalized anxiety disorder: Secondary | ICD-10-CM | POA: Diagnosis not present

## 2014-01-13 DIAGNOSIS — F331 Major depressive disorder, recurrent, moderate: Secondary | ICD-10-CM | POA: Diagnosis not present

## 2014-01-14 ENCOUNTER — Other Ambulatory Visit (HOSPITAL_COMMUNITY): Payer: Self-pay | Admitting: Psychiatry

## 2014-01-14 ENCOUNTER — Telehealth (HOSPITAL_COMMUNITY): Payer: Self-pay | Admitting: *Deleted

## 2014-01-14 DIAGNOSIS — F411 Generalized anxiety disorder: Secondary | ICD-10-CM | POA: Diagnosis not present

## 2014-01-14 DIAGNOSIS — F331 Major depressive disorder, recurrent, moderate: Secondary | ICD-10-CM | POA: Diagnosis not present

## 2014-01-14 MED ORDER — ARIPIPRAZOLE 2 MG PO TABS: 4.0000 mg | ORAL_TABLET | Freq: Every morning | ORAL | Status: DC

## 2014-01-14 NOTE — Telephone Encounter (Signed)
Chart reviewed. Abilify 5mg  refill not appropriate. Was last given 4mg  12/28/13 by [name] and patient was to call back if refill was needed for 4mg  if no weight gain. Pt no longer on 5mg .

## 2014-01-14 NOTE — Telephone Encounter (Signed)
Pt called stating 4mg  Abilify has been working well for her and she would like to refill as Dr. Adele Schilder suggested. Chart reviewed. Refill appropriate as per Dr Adele Schilder note 12/28/13.

## 2014-01-17 ENCOUNTER — Telehealth (HOSPITAL_COMMUNITY): Payer: Self-pay | Admitting: *Deleted

## 2014-01-17 ENCOUNTER — Other Ambulatory Visit (HOSPITAL_COMMUNITY): Payer: Self-pay | Admitting: *Deleted

## 2014-01-17 DIAGNOSIS — F411 Generalized anxiety disorder: Secondary | ICD-10-CM | POA: Diagnosis not present

## 2014-01-17 DIAGNOSIS — F331 Major depressive disorder, recurrent, moderate: Secondary | ICD-10-CM | POA: Diagnosis not present

## 2014-01-17 MED ORDER — ARIPIPRAZOLE 5 MG PO TABS: 5.0000 mg | ORAL_TABLET | Freq: Every day | ORAL | Status: DC

## 2014-01-17 NOTE — Telephone Encounter (Signed)
RX for Abioify 5 mg printed in error. Phoned in to pharmacy at this time.

## 2014-01-17 NOTE — Telephone Encounter (Signed)
Insurance plan only allows one tablet daily of Abilify. When new RX sent for Abilify 2mg  BID - Insurance would not pay due to quantity.  NOTE:Patient originally on 5 mg, was decreased by Dr. Adele Schilder on 09/02/13 to 2 mg due to concerns ID:[id number]. Patient did not tolerate decrease in dose, was increased to 4 mg on 12/28/13 - pt took medication from current RX. When new RX sent for Abilify 2mg  BID - Insurance would not pay due to quantity.  Patient states she will be fine on 5 mg and asked if she could have 5 mg dose? Pharmacy sent request to change to 5 mg due to insurance. Information reviewed by Dr. Donnelly Angelica in Dr. [name] absence. Abilify 5 mg daily ordered.

## 2014-01-18 DIAGNOSIS — F331 Major depressive disorder, recurrent, moderate: Secondary | ICD-10-CM | POA: Diagnosis not present

## 2014-01-18 DIAGNOSIS — F411 Generalized anxiety disorder: Secondary | ICD-10-CM | POA: Diagnosis not present

## 2014-01-19 DIAGNOSIS — F331 Major depressive disorder, recurrent, moderate: Secondary | ICD-10-CM | POA: Diagnosis not present

## 2014-01-19 DIAGNOSIS — F411 Generalized anxiety disorder: Secondary | ICD-10-CM | POA: Diagnosis not present

## 2014-01-26 ENCOUNTER — Other Ambulatory Visit (HOSPITAL_COMMUNITY): Payer: Self-pay | Admitting: Psychiatry

## 2014-01-29 ENCOUNTER — Other Ambulatory Visit (HOSPITAL_COMMUNITY): Payer: Self-pay | Admitting: Psychiatry

## 2014-01-31 DIAGNOSIS — F331 Major depressive disorder, recurrent, moderate: Secondary | ICD-10-CM | POA: Diagnosis not present

## 2014-02-02 ENCOUNTER — Encounter (HOSPITAL_COMMUNITY): Payer: Self-pay | Admitting: Psychiatry

## 2014-02-02 ENCOUNTER — Ambulatory Visit (INDEPENDENT_AMBULATORY_CARE_PROVIDER_SITE_OTHER): Payer: Medicare Other | Admitting: Psychiatry

## 2014-02-02 VITALS — BP 138/90 | HR 92 | Ht 70.0 in | Wt 288.8 lb

## 2014-02-02 DIAGNOSIS — F331 Major depressive disorder, recurrent, moderate: Secondary | ICD-10-CM

## 2014-02-02 DIAGNOSIS — F411 Generalized anxiety disorder: Secondary | ICD-10-CM

## 2014-02-02 MED ORDER — ARIPIPRAZOLE 5 MG PO TABS: 5.0000 mg | ORAL_TABLET | Freq: Every day | ORAL | Status: DC

## 2014-02-02 MED ORDER — BUPROPION HCL ER (XL) 300 MG PO TB24: ORAL_TABLET | ORAL | Status: DC

## 2014-02-02 MED ORDER — CLORAZEPATE DIPOTASSIUM 15 MG PO TABS: 15.0000 mg | ORAL_TABLET | Freq: Every morning | ORAL | Status: DC

## 2014-02-02 MED ORDER — GABAPENTIN 400 MG PO CAPS: 400.0000 mg | ORAL_CAPSULE | Freq: Every day | ORAL | Status: DC
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
## 2014-02-14 ENCOUNTER — Ambulatory Visit (HOSPITAL_COMMUNITY): Payer: Self-pay | Admitting: Clinical

## 2014-02-14 DIAGNOSIS — F331 Major depressive disorder, recurrent, moderate: Secondary | ICD-10-CM | POA: Diagnosis not present

## 2014-02-15 DIAGNOSIS — F331 Major depressive disorder, recurrent, moderate: Secondary | ICD-10-CM | POA: Diagnosis not present

## 2014-02-16 DIAGNOSIS — F331 Major depressive disorder, recurrent, moderate: Secondary | ICD-10-CM | POA: Diagnosis not present

## 2014-02-17 DIAGNOSIS — F331 Major depressive disorder, recurrent, moderate: Secondary | ICD-10-CM | POA: Diagnosis not present

## 2014-02-18 DIAGNOSIS — F331 Major depressive disorder, recurrent, moderate: Secondary | ICD-10-CM | POA: Diagnosis not present

## 2014-02-21 ENCOUNTER — Ambulatory Visit (INDEPENDENT_AMBULATORY_CARE_PROVIDER_SITE_OTHER): Payer: Medicare Other | Admitting: Obstetrics & Gynecology

## 2014-02-21 ENCOUNTER — Encounter: Payer: Self-pay | Admitting: Obstetrics & Gynecology

## 2014-02-21 VITALS — BP 143/87 | HR 88 | Ht 70.0 in | Wt 293.9 lb

## 2014-02-21 DIAGNOSIS — N938 Other specified abnormal uterine and vaginal bleeding: Secondary | ICD-10-CM

## 2014-02-21 DIAGNOSIS — Z30430 Encounter for insertion of intrauterine contraceptive device: Secondary | ICD-10-CM

## 2014-02-21 DIAGNOSIS — Z01812 Encounter for preprocedural laboratory examination: Secondary | ICD-10-CM

## 2014-02-21 DIAGNOSIS — F331 Major depressive disorder, recurrent, moderate: Secondary | ICD-10-CM | POA: Diagnosis not present

## 2014-02-21 LAB — POCT PREGNANCY, URINE: PREG TEST UR: NEGATIVE

## 2014-02-21 MED ORDER — LEVONORGESTREL 20 MCG/24HR IU IUD
INTRAUTERINE_SYSTEM | Freq: Once | INTRAUTERINE | Status: AC
  Administered 2014-02-21: 1 via INTRAUTERINE

## 2014-02-21 NOTE — Patient Instructions (Signed)
Levonorgestrel intrauterine device (IUD) What is this medicine? LEVONORGESTREL IUD (LEE voe nor jes trel) is a contraceptive (birth control) device. The device is placed inside the uterus by a healthcare professional. It is used to prevent pregnancy and can also be used to treat heavy bleeding that occurs during your period. Depending on the device, it can be used for 3 to 5 years. This medicine may be used for other purposes; ask your health care provider or pharmacist if you have questions. COMMON BRAND NAME(S): LILETTA, Mirena, Skyla What should I tell my health care provider before I take this medicine? They need to know if you have any of these conditions: -abnormal Pap smear -cancer of the breast, uterus, or cervix -diabetes -endometritis -genital or pelvic infection now or in the past -have more than one sexual partner or your partner has more than one partner -heart disease -history of an ectopic or tubal pregnancy -immune system problems -IUD in place -liver disease or tumor -problems with blood clots or take blood-thinners -use intravenous drugs -uterus of unusual shape -vaginal bleeding that has not been explained -an unusual or allergic reaction to levonorgestrel, other hormones, silicone, or polyethylene, medicines, foods, dyes, or preservatives -pregnant or trying to get pregnant -breast-feeding How should I use this medicine? This device is placed inside the uterus by a health care professional. Talk to your pediatrician regarding the use of this medicine in children. Special care may be needed. Overdosage: If you think you have taken too much of this medicine contact a poison control center or emergency room at once. NOTE: This medicine is only for you. Do not share this medicine with others. What if I miss a dose? This does not apply. What may interact with this medicine? Do not take this medicine with any of the following  medications: -amprenavir -bosentan -fosamprenavir This medicine may also interact with the following medications: -aprepitant -barbiturate medicines for inducing sleep or treating seizures -bexarotene -griseofulvin -medicines to treat seizures like carbamazepine, ethotoin, felbamate, oxcarbazepine, phenytoin, topiramate -modafinil -pioglitazone -rifabutin -rifampin -rifapentine -some medicines to treat HIV infection like atazanavir, indinavir, lopinavir, nelfinavir, tipranavir, ritonavir -St. John's wort -warfarin This list may not describe all possible interactions. Give your health care provider a list of all the medicines, herbs, non-prescription drugs, or dietary supplements you use. Also tell them if you smoke, drink alcohol, or use illegal drugs. Some items may interact with your medicine. What should I watch for while using this medicine? Visit your doctor or health care professional for regular check ups. See your doctor if you or your partner has sexual contact with others, becomes HIV positive, or gets a sexual transmitted disease. This product does not protect you against HIV infection (AIDS) or other sexually transmitted diseases. You can check the placement of the IUD yourself by reaching up to the top of your vagina with clean fingers to feel the threads. Do not pull on the threads. It is a good habit to check placement after each menstrual period. Call your doctor right away if you feel more of the IUD than just the threads or if you cannot feel the threads at all. The IUD may come out by itself. You may become pregnant if the device comes out. If you notice that the IUD has come out use a backup birth control method like condoms and call your health care provider. Using tampons will not change the position of the IUD and are okay to use during your period. What side effects may   I notice from receiving this medicine? Side effects that you should report to your doctor or  health care professional as soon as possible: -allergic reactions like skin rash, itching or hives, swelling of the face, lips, or tongue -fever, flu-like symptoms -genital sores -high blood pressure -no menstrual period for 6 weeks during use -pain, swelling, warmth in the leg -pelvic pain or tenderness -severe or sudden headache -signs of pregnancy -stomach cramping -sudden shortness of breath -trouble with balance, talking, or walking -unusual vaginal bleeding, discharge -yellowing of the eyes or skin Side effects that usually do not require medical attention (report to your doctor or health care professional if they continue or are bothersome): -acne -breast pain -change in sex drive or performance -changes in weight -cramping, dizziness, or faintness while the device is being inserted -headache -irregular menstrual bleeding within first 3 to 6 months of use -nausea This list may not describe all possible side effects. Call your doctor for medical advice about side effects. You may report side effects to FDA at 1-800-FDA-1088. Where should I keep my medicine? This does not apply. NOTE: This sheet is a summary. It may not cover all possible information. If you have questions about this medicine, talk to your doctor, pharmacist, or health care provider.  2015, Elsevier/Gold Standard. (2011-02-14 13:54:04)  

## 2014-02-21 NOTE — Progress Notes (Signed)
Patient ID: Lindsay [name], female   DOB: [birth year], 44 y.o.   MRN: [id number] GYNECOLOGY CLINIC PROCEDURE NOTE  Lindsay [name] is a 44 y.o. No obstetric history on file. here for Mirena IUD insertion. No GYN concerns.  Last pap smear was on 11/2013 and was normal.  IUD Insertion Procedure Note Patient identified, informed consent performed.  Discussed risks of irregular bleeding, cramping, infection, malpositioning or misplacement of the IUD outside the uterus which may require further procedures. Time out was performed.  Urine pregnancy test negative.  Speculum placed in the vagina.  Cervix visualized.  Cleaned with Betadine x 2.  Grasped anteriorly with a single tooth tenaculum.  Uterus sounded to 8 cm.  Mirena IUD placed per manufacturer's recommendations.  Strings trimmed to 3 cm. Tenaculum was removed, good hemostasis noted.  Patient tolerated procedure well.   Patient was given post-procedure instructions.  Patient was also asked to check IUD strings periodically and follow up in 4 weeks for IUD check.

## 2014-02-22 DIAGNOSIS — F331 Major depressive disorder, recurrent, moderate: Secondary | ICD-10-CM | POA: Diagnosis not present

## 2014-02-23 DIAGNOSIS — F331 Major depressive disorder, recurrent, moderate: Secondary | ICD-10-CM | POA: Diagnosis not present

## 2014-02-25 DIAGNOSIS — F331 Major depressive disorder, recurrent, moderate: Secondary | ICD-10-CM | POA: Diagnosis not present

## 2014-03-01 ENCOUNTER — Ambulatory Visit (INDEPENDENT_AMBULATORY_CARE_PROVIDER_SITE_OTHER): Payer: Medicare Other | Admitting: Clinical

## 2014-03-01 ENCOUNTER — Encounter (HOSPITAL_COMMUNITY): Payer: Self-pay | Admitting: Clinical

## 2014-03-01 DIAGNOSIS — F331 Major depressive disorder, recurrent, moderate: Secondary | ICD-10-CM

## 2014-03-01 DIAGNOSIS — F411 Generalized anxiety disorder: Secondary | ICD-10-CM | POA: Diagnosis not present
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
## 2014-03-07 ENCOUNTER — Other Ambulatory Visit (HOSPITAL_COMMUNITY): Payer: Self-pay | Admitting: Psychiatry

## 2014-03-07 DIAGNOSIS — F331 Major depressive disorder, recurrent, moderate: Secondary | ICD-10-CM | POA: Diagnosis not present

## 2014-03-07 NOTE — Telephone Encounter (Signed)
Telephone call from patient requesting a refill of her Tranxene.  Discussed patient's status with receiving a new prescription on 02/02/14 with 2 refills.  Agreed to call patient's [facility] pharmacy as they are reporting to her they need a new order.  Telephone call with pharmacist at [facility] on [address] to verify they had patient's most current prescription.  Pharmacist stated they were looking at an old order but they indeed did have patients 02/02/14 new order for Tranxene.  Called patient back to inform they were now filling her prescription and had the one from 02/02/14.  Patient to call back if any further difficulties filling the medicine today.

## 2014-03-08 DIAGNOSIS — F331 Major depressive disorder, recurrent, moderate: Secondary | ICD-10-CM | POA: Diagnosis not present

## 2014-03-09 DIAGNOSIS — F331 Major depressive disorder, recurrent, moderate: Secondary | ICD-10-CM | POA: Diagnosis not present

## 2014-03-10 DIAGNOSIS — F331 Major depressive disorder, recurrent, moderate: Secondary | ICD-10-CM | POA: Diagnosis not present

## 2014-03-11 ENCOUNTER — Telehealth: Payer: Self-pay | Admitting: Obstetrics and Gynecology

## 2014-03-11 DIAGNOSIS — F331 Major depressive disorder, recurrent, moderate: Secondary | ICD-10-CM | POA: Diagnosis not present

## 2014-03-11 NOTE — Telephone Encounter (Signed)
Patient called with c/o pelvic pain and vaginal bleeding post IUD insertion on 02/21/14. Pt states pad count is 1-2 blood saturated pad per hour. Pt follow up appt on 03/25/13 moved up to this coming Monday 03/14/14 @ 1230p with Monna Fam, [place]. Ms Lorenso Courier at front desk aware to make appt changes. Pt advised that if symptoms worsens such as increase in bleeding and/or pain, that she is to go to [facility] for evaluation. Pt agrees to all and satisfied.

## 2014-03-14 ENCOUNTER — Ambulatory Visit: Payer: Medicare Other | Admitting: Nurse Practitioner

## 2014-03-17 ENCOUNTER — Encounter (HOSPITAL_COMMUNITY): Payer: Self-pay | Admitting: Clinical

## 2014-03-17 ENCOUNTER — Ambulatory Visit (INDEPENDENT_AMBULATORY_CARE_PROVIDER_SITE_OTHER): Payer: Medicare Other | Admitting: Clinical

## 2014-03-17 DIAGNOSIS — F411 Generalized anxiety disorder: Secondary | ICD-10-CM

## 2014-03-17 DIAGNOSIS — F331 Major depressive disorder, recurrent, moderate: Secondary | ICD-10-CM

## 2014-03-17 NOTE — Progress Notes (Deleted)
   THERAPIST PROGRESS NOTE  Session Time: ***  Participation Level: {BHH PARTICIPATION LEVEL:22264}  Behavioral Response: {Appearance:22683}{BHH LEVEL OF CONSCIOUSNESS:22305}{BHH MOOD:22306}  Type of Therapy: {CHL AMB BH Type of Therapy:21022741}  Treatment Goals addressed: {CHL AMB BH Treatment Goals Addressed:21022754}  Interventions: {CHL AMB BH Type of Intervention:21022753}  Summary: Lindsay [name] is a 44 y.o. female who presents with ***.   Suicidal/Homicidal: {BHH YES OR NO:22294}{yes/no/with/without intent/plan:22693}  Therapist Response: ***  Plan: Return again in *** weeks.  Diagnosis: Axis I: {psych axis 1:31909}    Axis II: {psych axis 2:31910}    Lindsay [name] A, LCSW 03/17/2014   [id number]

## 2014-03-18 DIAGNOSIS — F331 Major depressive disorder, recurrent, moderate: Secondary | ICD-10-CM | POA: Diagnosis not present

## 2014-03-18 NOTE — Progress Notes (Signed)
   THERAPIST PROGRESS NOTE  Session Time: 8:07 -9:03  Participation Level: Active  Behavioral Response: Casual and NeatAlertDepressed  Type of Therapy: Individual Therapy  Treatment Goals addressed: Psychiatric Symptoms, Calming Skills, Emotional Regulation Skills  Interventions: Motivational Interviewing and Other: Grounding and mindfulness techniques  Summary: Lindsay [name] is a 44 y.o. female who presents with Major Depressive Disorder and Generalized Anxiety Disorder.   Suicidal/Homicidal: Nowithout intent/plan  Therapist Response:  Haylynn met with clinician for an individual session. Ashliegh discussed her psychiatric symptoms and current life events. Beverlee  Shared that she experiences a lot of depression related to taking care of the needs of her family. She shared that she loves her family but that she is responsible for her daughter and her mother's  Appointments and care. She shared that care taking others leaves little time for self care. Client and clinician discussed the things that she can change and those that she cannot.  Aolani shared that she was aware that worrying and ruminating about her tasks in between her tasks increases her feelings of being overwhelmed. Clinician discussed how she could begin to change her thoughts. Clinician introduced  Grounding and mindfulness techniques. Clinician discussed the process and application of the techniques. Client and clinician practiced some of the techniques together. Keegan agreed to practice the techniques until next session.   Plan: Return again in 2 weeks.  Diagnosis: Axis I: Major Depressive Disorder and Generalized Anxiety Disorder.        Kallum Jorgensen A, LCSW 03/18/2014

## 2014-03-21 DIAGNOSIS — F331 Major depressive disorder, recurrent, moderate: Secondary | ICD-10-CM | POA: Diagnosis not present

## 2014-03-23 DIAGNOSIS — F331 Major depressive disorder, recurrent, moderate: Secondary | ICD-10-CM | POA: Diagnosis not present

## 2014-03-24 DIAGNOSIS — F331 Major depressive disorder, recurrent, moderate: Secondary | ICD-10-CM | POA: Diagnosis not present

## 2014-03-25 ENCOUNTER — Encounter: Payer: Self-pay | Admitting: *Deleted

## 2014-03-25 ENCOUNTER — Ambulatory Visit (INDEPENDENT_AMBULATORY_CARE_PROVIDER_SITE_OTHER): Payer: Medicare Other | Admitting: Family Medicine

## 2014-03-25 ENCOUNTER — Encounter: Payer: Self-pay | Admitting: Obstetrics & Gynecology

## 2014-03-25 VITALS — BP 126/76 | HR 77 | Temp 98.6°F | Wt 298.0 lb

## 2014-03-25 DIAGNOSIS — R102 Pelvic and perineal pain: Secondary | ICD-10-CM | POA: Diagnosis not present

## 2014-03-25 DIAGNOSIS — N951 Menopausal and female climacteric states: Secondary | ICD-10-CM

## 2014-03-25 DIAGNOSIS — N949 Unspecified condition associated with female genital organs and menstrual cycle: Secondary | ICD-10-CM | POA: Diagnosis not present

## 2014-03-25 DIAGNOSIS — R232 Flushing: Secondary | ICD-10-CM

## 2014-03-25 DIAGNOSIS — F331 Major depressive disorder, recurrent, moderate: Secondary | ICD-10-CM | POA: Diagnosis not present

## 2014-03-25 DIAGNOSIS — R1110 Vomiting, unspecified: Secondary | ICD-10-CM

## 2014-03-25 LAB — FOLLICLE STIMULATING HORMONE: FSH: 6.5 m[IU]/mL

## 2014-03-25 MED ORDER — PROMETHAZINE HCL 25 MG PO TABS: 25.0000 mg | ORAL_TABLET | Freq: Four times a day (QID) | ORAL | Status: DC | PRN

## 2014-03-25 MED ORDER — DICLOFENAC SODIUM 75 MG PO TBEC: 75.0000 mg | DELAYED_RELEASE_TABLET | Freq: Two times a day (BID) | ORAL | Status: DC

## 2014-03-25 NOTE — Progress Notes (Signed)
    Subjective:    Patient ID: Lindsay [name] is a 44 y.o. female presenting with IUD string check  on 03/25/2014  HPI: Reports serious cramping lower abdominal pain since placement of IUD 02/21/14. Previously had Mirena x 7 years. Had cycle regularly and was using her IUD for period control. No pain with that one.  Has tried papmprin and Motrin without relief. Pain is better while sleeping. Has had spotting and bleeding since. Notes bleeding was really heavy x 3 wks.  Has since lightened up. Notes recurrent hot flashes. No intercourse since insertion.  Review of Systems  Constitutional: Negative.  Negative for fever and chills.  Respiratory: Negative for shortness of breath.   Cardiovascular: Negative for chest pain.  Gastrointestinal: Negative for nausea, vomiting and abdominal pain.  Genitourinary: Positive for vaginal bleeding and pelvic pain. Negative for dysuria. Difficulty urinating: hot flashes.  Skin: Negative for rash.      Objective:    BP 126/76 mmHg  Pulse 77  Temp(Src) 98.6 F (37 C)  Wt 298 lb (135.172 kg)  LMP 02/21/2014 Physical Exam  Constitutional: She is oriented to person, place, and time. She appears well-developed and well-nourished. No distress.  HENT:  Head: Normocephalic and atraumatic.  Eyes: No scleral icterus.  Neck: Neck supple.  Cardiovascular: Normal rate.   Pulmonary/Chest: Effort normal.  Abdominal: Soft.  Genitourinary: Uterus is tender. Cervix exhibits no motion tenderness. Right adnexum displays no mass and no tenderness. Left adnexum displays no mass and no tenderness. There is bleeding in the vagina.  Neurological: She is alert and oriented to person, place, and time.  Skin: Skin is warm and dry.  Psychiatric: She has a normal mood and affect.        Assessment & Plan:    Problem List Items Addressed This Visit      Unprioritized   Pelvic pain in female - Primary    The patient has had pain since IUD insertion.  Suspect this  might be related to malpositioned IUD.  Will check U/s and GC/Chlam today.      Relevant Medications   diclofenac (VOLTAREN) EC tablet   Other Relevant Orders   US Pelvis Complete   US Transvaginal Non-OB   GC/Chlamydia Probe Amp    Other Visit Diagnoses    Intractable vomiting with nausea, vomiting of unspecified type        Trial of phenergan, ? relation to pain.    Relevant Medications    promethazine (PHENERGAN)  tablet    Hot flashes        Will check [facility] and see how close she is to menopause.    Relevant Orders    Follicle stimulating hormone

## 2014-03-25 NOTE — Patient Instructions (Signed)
Pelvic Pain Female pelvic pain can be caused by many different things and start from a variety of places. Pelvic pain refers to pain that is located in the lower half of the abdomen and between your hips. The pain may occur over a short period of time (acute) or may be reoccurring (chronic). The cause of pelvic pain may be related to disorders affecting the female reproductive organs (gynecologic), but it may also be related to the bladder, kidney stones, an intestinal complication, or muscle or skeletal problems. Getting help right away for pelvic pain is important, especially if there has been severe, sharp, or a sudden onset of unusual pain. It is also important to get help right away because some types of pelvic pain can be life threatening.  CAUSES  Below are only some of the causes of pelvic pain. The causes of pelvic pain can be in one of several categories.   Gynecologic.  Pelvic inflammatory disease.  Sexually transmitted infection.  Ovarian cyst or a twisted ovarian ligament (ovarian torsion).  Uterine lining that grows outside the uterus (endometriosis).  Fibroids, cysts, or tumors.  Ovulation.  Pregnancy.  Pregnancy that occurs outside the uterus (ectopic pregnancy).  Miscarriage.  Labor.  Abruption of the placenta or ruptured uterus.  Infection.  Uterine infection (endometritis).  Bladder infection.  Diverticulitis.  Miscarriage related to a uterine infection (septic abortion).  Bladder.  Inflammation of the bladder (cystitis).  Kidney stone(s).  Gastrointestinal.  Constipation.  Diverticulitis.  Neurologic.  Trauma.  Feeling pelvic pain because of mental or emotional causes (psychosomatic).  Cancers of the bowel or pelvis. EVALUATION  Your caregiver will want to take a careful history of your concerns. This includes recent changes in your health, a careful gynecologic history of your periods (menses), and a sexual history. Obtaining your family  history and medical history is also important. Your caregiver may suggest a pelvic exam. A pelvic exam will help identify the location and severity of the pain. It also helps in the evaluation of which organ system may be involved. In order to identify the cause of the pelvic pain and be properly treated, your caregiver may order tests. These tests may include:   A pregnancy test.  Pelvic ultrasonography.  An X-ray exam of the abdomen.  A urinalysis or evaluation of vaginal discharge.  Blood tests. HOME CARE INSTRUCTIONS   Only take over-the-counter or prescription medicines for pain, discomfort, or fever as directed by your caregiver.   Rest as directed by your caregiver.   Eat a balanced diet.   Drink enough fluids to make your urine clear or pale yellow, or as directed.   Avoid sexual intercourse if it causes pain.   Apply warm or cold compresses to the lower abdomen depending on which one helps the pain.   Avoid stressful situations.   Keep a journal of your pelvic pain. Write down when it started, where the pain is located, and if there are things that seem to be associated with the pain, such as food or your menstrual cycle.  Follow up with your caregiver as directed.  SEEK MEDICAL CARE IF:  Your medicine does not help your pain.  You have abnormal vaginal discharge. SEEK IMMEDIATE MEDICAL CARE IF:   You have heavy bleeding from the vagina.   Your pelvic pain increases.   You feel light-headed or faint.   You have chills.   You have pain with urination or blood in your urine.   You have uncontrolled diarrhea   or vomiting.   You have a fever or persistent symptoms for more than 3 days.  You have a fever and your symptoms suddenly get worse.   You are being physically or sexually abused.  MAKE SURE YOU:  Understand these instructions.  Will watch your condition.  Will get help if you are not doing well or get worse. Document Released:  12/12/2003 Document Revised: 05/31/2013 Document Reviewed: 05/06/2011 ExitCare Patient Information 2015 [employer]. This information is not intended to replace advice given to you by your health care provider. Make sure you discuss any questions you have with your health care provider.  

## 2014-03-25 NOTE — Progress Notes (Signed)
Here for IUD check. States has been bleeding since IUD placed. C/o nausea and pelvic pain - worse on right lower side.

## 2014-03-25 NOTE — Assessment & Plan Note (Signed)
The patient has had pain since IUD insertion.  Suspect this might be related to malpositioned IUD.  Will check U/s and GC/Chlam today.

## 2014-03-26 DIAGNOSIS — F331 Major depressive disorder, recurrent, moderate: Secondary | ICD-10-CM | POA: Diagnosis not present

## 2014-03-26 LAB — GC/CHLAMYDIA PROBE AMP
CT Probe RNA: NEGATIVE
GC Probe RNA: NEGATIVE

## 2014-03-28 DIAGNOSIS — F331 Major depressive disorder, recurrent, moderate: Secondary | ICD-10-CM | POA: Diagnosis not present

## 2014-03-29 DIAGNOSIS — F331 Major depressive disorder, recurrent, moderate: Secondary | ICD-10-CM | POA: Diagnosis not present

## 2014-03-30 DIAGNOSIS — F331 Major depressive disorder, recurrent, moderate: Secondary | ICD-10-CM | POA: Diagnosis not present

## 2014-03-31 DIAGNOSIS — F331 Major depressive disorder, recurrent, moderate: Secondary | ICD-10-CM | POA: Diagnosis not present

## 2014-04-01 DIAGNOSIS — F331 Major depressive disorder, recurrent, moderate: Secondary | ICD-10-CM | POA: Diagnosis not present

## 2014-04-02 DIAGNOSIS — F331 Major depressive disorder, recurrent, moderate: Secondary | ICD-10-CM | POA: Diagnosis not present

## 2014-04-04 DIAGNOSIS — F331 Major depressive disorder, recurrent, moderate: Secondary | ICD-10-CM | POA: Diagnosis not present

## 2014-04-05 ENCOUNTER — Ambulatory Visit (HOSPITAL_COMMUNITY): Payer: Medicare Other

## 2014-04-05 DIAGNOSIS — F331 Major depressive disorder, recurrent, moderate: Secondary | ICD-10-CM | POA: Diagnosis not present

## 2014-04-06 ENCOUNTER — Ambulatory Visit (HOSPITAL_COMMUNITY)
Admission: RE | Admit: 2014-04-06 | Discharge: 2014-04-06 | Disposition: A | Discharge status: Home / Self Care | Payer: Medicare Other | Source: Ambulatory Visit | Attending: Family Medicine | Admitting: Family Medicine

## 2014-04-06 DIAGNOSIS — R102 Pelvic and perineal pain: Secondary | ICD-10-CM

## 2014-04-06 DIAGNOSIS — Z975 Presence of (intrauterine) contraceptive device: Secondary | ICD-10-CM | POA: Diagnosis not present

## 2014-04-06 DIAGNOSIS — F331 Major depressive disorder, recurrent, moderate: Secondary | ICD-10-CM | POA: Diagnosis not present

## 2014-04-06 IMAGING — US US TRANSVAGINAL NON-OB
1 series · 14 of 25 positions shown · non-contrast
Comparison: [DATE]

CLINICAL DATA: Pelvic pain since IUD insertion.

EXAM:
ULTRASOUND PELVIS TRANSVAGINAL
TECHNIQUE: Transvaginal ultrasound examination of the pelvis was performed
including evaluation of the uterus, ovaries, adnexal regions, and
pelvic cul-de-sac.

[Series 1: us transvaginal non-ob · 14 of 27 slices shown]
[im 1/27]
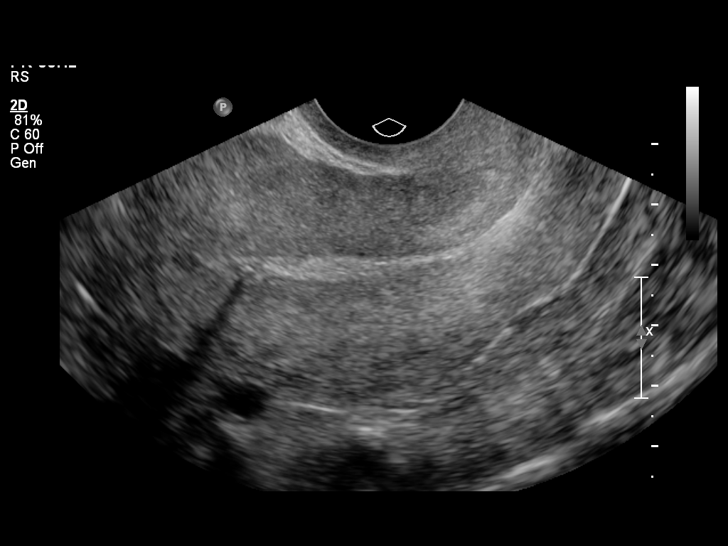
[im 3/27]
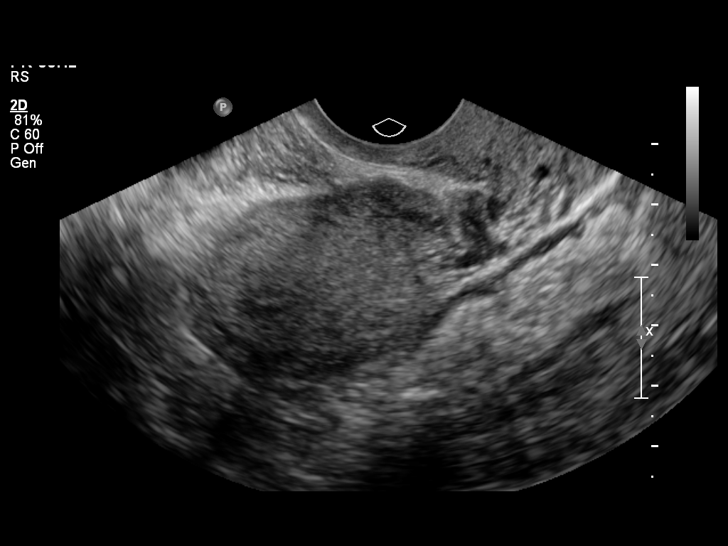
[im 5/27]
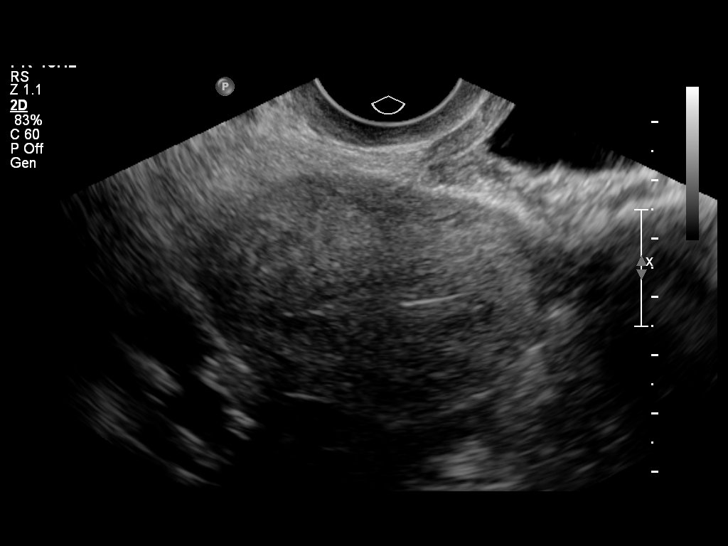
[im 7/27]
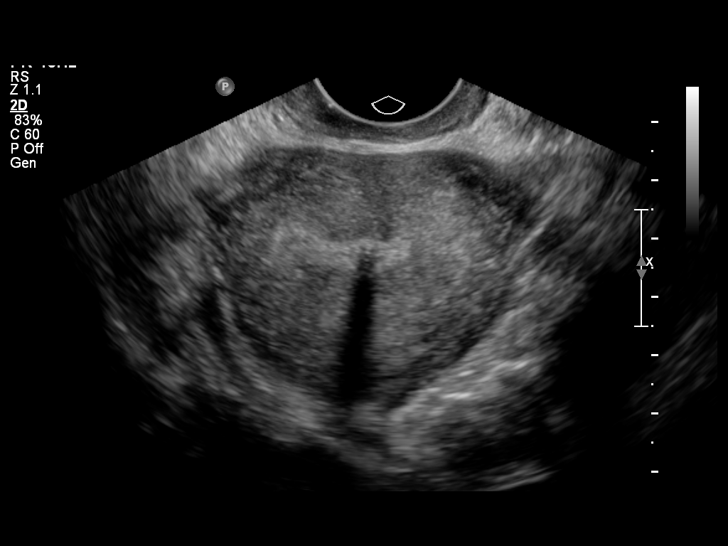
[im 9/27]
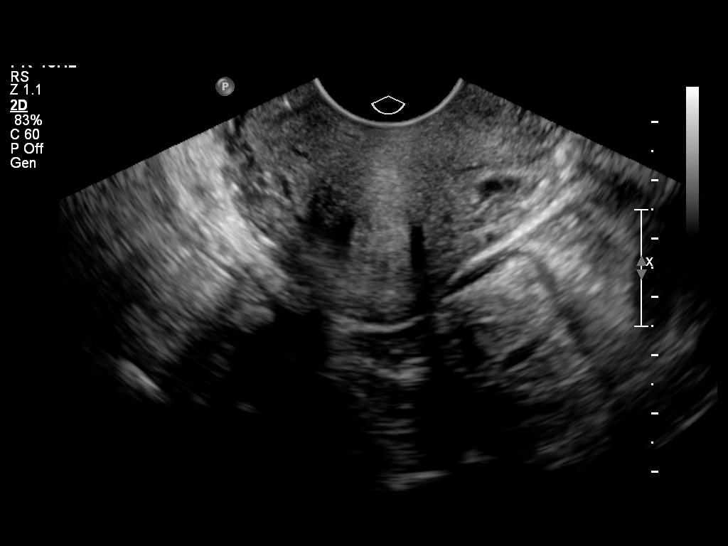
[im 10/27]
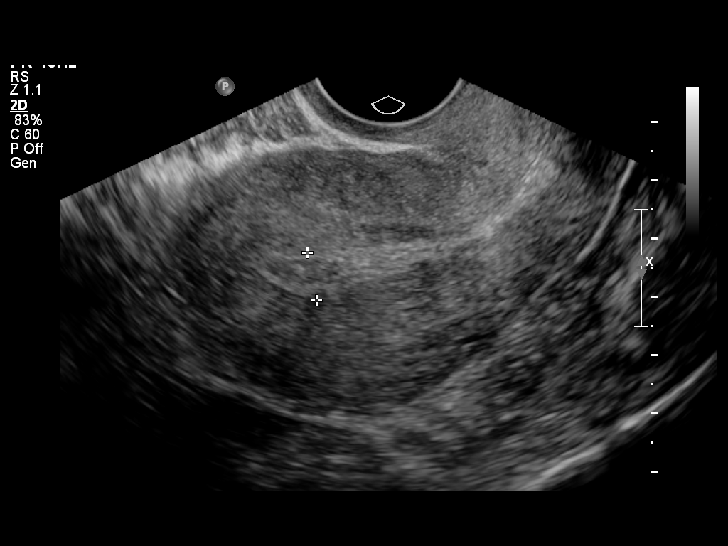
[im 12/27]
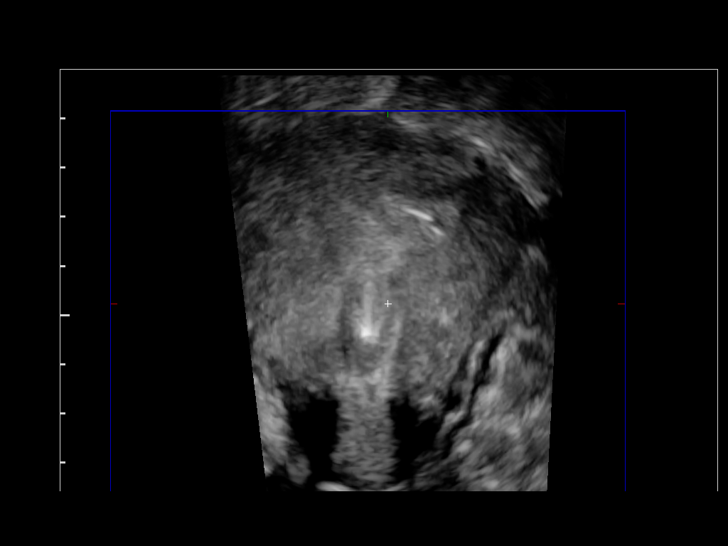
[im 15/27]
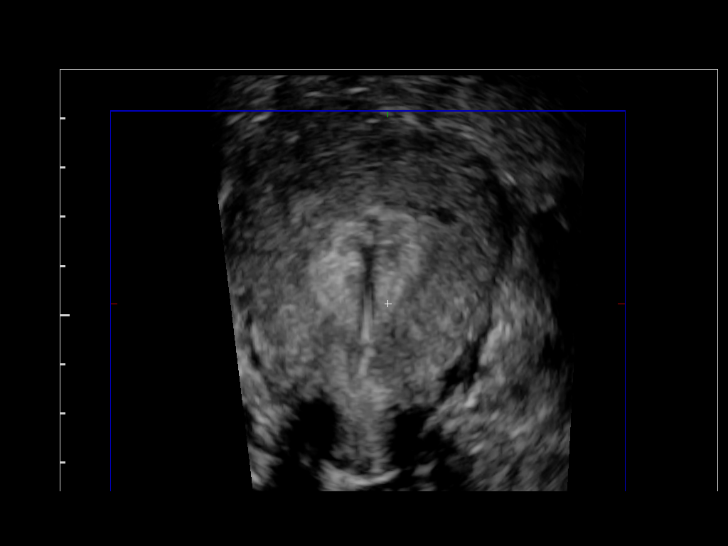
[im 17/27]
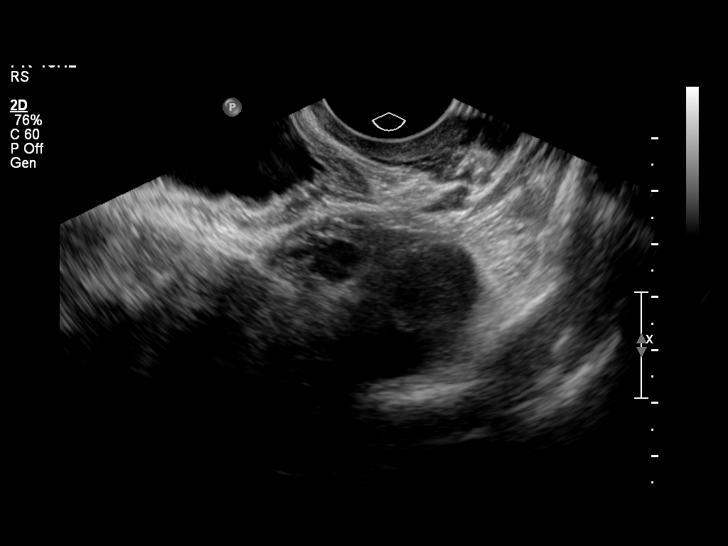
[im 18/27]
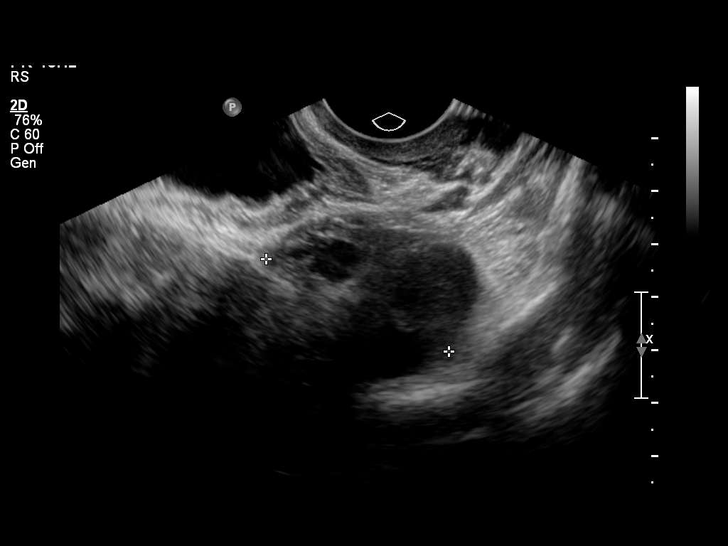
[im 20/27]
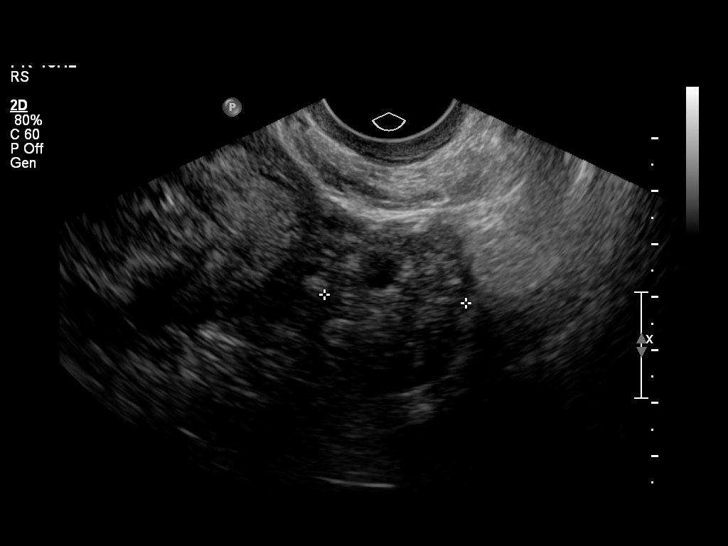
[im 22/27]
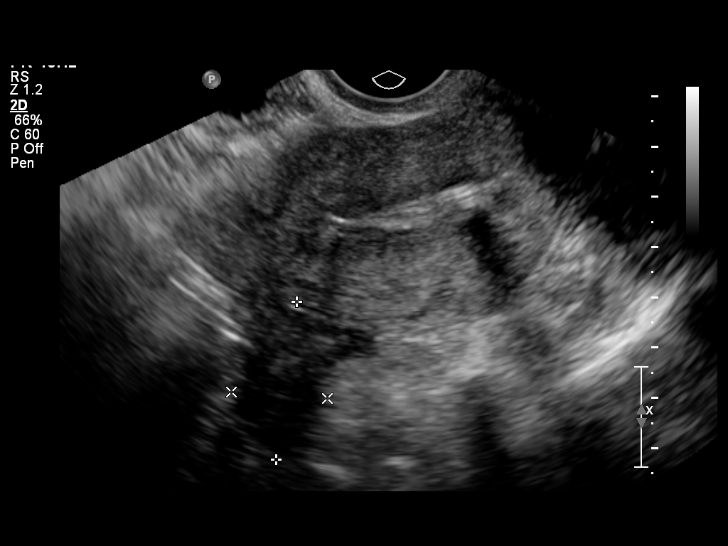
[im 24/27]
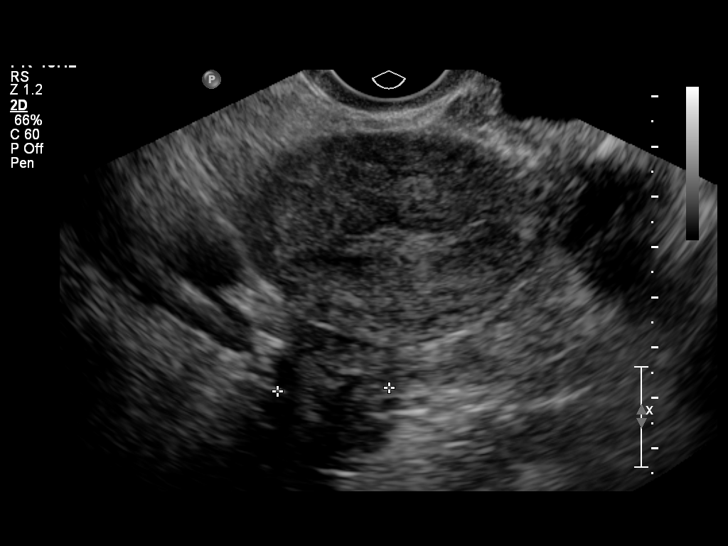
[im 27/27]
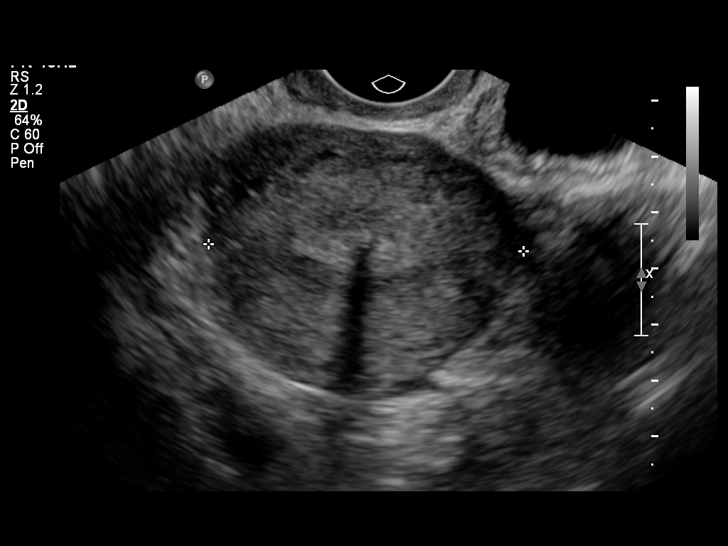

[14 of 25 positions shown; findings below may reference images not displayed]

FINDINGS: Uterus

Measurements: 9.2 x 5.0 x 5.6 cm. No fibroids or other mass
visualized.

Endometrium

Thickness: 8 mm in thickness. IUD in expected position. No focal
abnormality visualized.

Right ovary

Measurements: 3.1 x 1.9 x 2.2 cm. Normal appearance/no adnexal mass.

Left ovary

Measurements: 3.9 x 3.4 x 2.7 cm. Normal appearance/no adnexal mass.

Other findings:  No free fluid
IMPRESSION: IUD in expected position.  No acute findings.

## 2014-04-07 ENCOUNTER — Ambulatory Visit (INDEPENDENT_AMBULATORY_CARE_PROVIDER_SITE_OTHER): Payer: Medicare Other | Admitting: Family Medicine

## 2014-04-07 ENCOUNTER — Encounter: Payer: Self-pay | Admitting: Family Medicine

## 2014-04-07 ENCOUNTER — Encounter: Payer: Self-pay | Admitting: *Deleted

## 2014-04-07 VITALS — BP 128/74 | HR 82 | Temp 98.6°F | Ht 70.0 in | Wt 303.0 lb

## 2014-04-07 DIAGNOSIS — F331 Major depressive disorder, recurrent, moderate: Secondary | ICD-10-CM | POA: Diagnosis not present

## 2014-04-07 DIAGNOSIS — R102 Pelvic and perineal pain: Secondary | ICD-10-CM

## 2014-04-07 DIAGNOSIS — N938 Other specified abnormal uterine and vaginal bleeding: Secondary | ICD-10-CM | POA: Diagnosis not present

## 2014-04-07 DIAGNOSIS — Z30433 Encounter for removal and reinsertion of intrauterine contraceptive device: Secondary | ICD-10-CM | POA: Diagnosis not present

## 2014-04-07 MED ORDER — ETONOGESTREL 68 MG ~~LOC~~ IMPL
68.0000 mg | DRUG_IMPLANT | Freq: Once | SUBCUTANEOUS | Status: AC
  Administered 2014-04-07: 68 mg via SUBCUTANEOUS

## 2014-04-07 NOTE — Assessment & Plan Note (Signed)
Have removed possible offending IUD.  Replaced with Nexplanon to improve cycle control and for contraception.  Advised of bleeding profile.

## 2014-04-07 NOTE — Patient Instructions (Signed)
Etonogestrel implant What is this medicine? ETONOGESTREL (et oh noe JES trel) is a contraceptive (birth control) device. It is used to prevent pregnancy. It can be used for up to 3 years. This medicine may be used for other purposes; ask your health care provider or pharmacist if you have questions. COMMON BRAND NAME(S): Implanon, Nexplanon What should I tell my health care provider before I take this medicine? They need to know if you have any of these conditions: -abnormal vaginal bleeding -blood vessel disease or blood clots -cancer of the breast, cervix, or liver -depression -diabetes -gallbladder disease -headaches -heart disease or recent heart attack -high blood pressure -high cholesterol -kidney disease -liver disease -renal disease -seizures -tobacco smoker -an unusual or allergic reaction to etonogestrel, other hormones, anesthetics or antiseptics, medicines, foods, dyes, or preservatives -pregnant or trying to get pregnant -breast-feeding How should I use this medicine? This device is inserted just under the skin on the inner side of your upper arm by a health care professional. Talk to your pediatrician regarding the use of this medicine in children. Special care may be needed. Overdosage: If you think you've taken too much of this medicine contact a poison control center or emergency room at once. Overdosage: If you think you have taken too much of this medicine contact a poison control center or emergency room at once. NOTE: This medicine is only for you. Do not share this medicine with others. What if I miss a dose? This does not apply. What may interact with this medicine? Do not take this medicine with any of the following medications: -amprenavir -bosentan -fosamprenavir This medicine may also interact with the following medications: -barbiturate medicines for inducing sleep or treating seizures -certain medicines for fungal infections like ketoconazole and  itraconazole -griseofulvin -medicines to treat seizures like carbamazepine, felbamate, oxcarbazepine, phenytoin, topiramate -modafinil -phenylbutazone -rifampin -some medicines to treat HIV infection like atazanavir, indinavir, lopinavir, nelfinavir, tipranavir, ritonavir -St. John's wort This list may not describe all possible interactions. Give your health care provider a list of all the medicines, herbs, non-prescription drugs, or dietary supplements you use. Also tell them if you smoke, drink alcohol, or use illegal drugs. Some items may interact with your medicine. What should I watch for while using this medicine? This product does not protect you against HIV infection (AIDS) or other sexually transmitted diseases. You should be able to feel the implant by pressing your fingertips over the skin where it was inserted. Tell your doctor if you cannot feel the implant. What side effects may I notice from receiving this medicine? Side effects that you should report to your doctor or health care professional as soon as possible: -allergic reactions like skin rash, itching or hives, swelling of the face, lips, or tongue -breast lumps -changes in vision -confusion, trouble speaking or understanding -dark urine -depressed mood -general ill feeling or flu-like symptoms -light-colored stools -loss of appetite, nausea -right upper belly pain -severe headaches -severe pain, swelling, or tenderness in the abdomen -shortness of breath, chest pain, swelling in a leg -signs of pregnancy -sudden numbness or weakness of the face, arm or leg -trouble walking, dizziness, loss of balance or coordination -unusual vaginal bleeding, discharge -unusually weak or tired -yellowing of the eyes or skin Side effects that usually do not require medical attention (Report these to your doctor or health care professional if they continue or are bothersome.): -acne -breast pain -changes in  weight -cough -fever or chills -headache -irregular menstrual bleeding -itching, burning, and   vaginal discharge -pain or difficulty passing urine -sore throat This list may not describe all possible side effects. Call your doctor for medical advice about side effects. You may report side effects to FDA at 1-800-FDA-1088. Where should I keep my medicine? This drug is given in a hospital or clinic and will not be stored at home. NOTE: This sheet is a summary. It may not cover all possible information. If you have questions about this medicine, talk to your doctor, pharmacist, or health care provider.  2015, Elsevier/Gold Standard. (2011-07-22 15:37:45)  

## 2014-04-07 NOTE — Progress Notes (Signed)
   Subjective:    Patient ID: Lindsay [name] is a 44 y.o. female presenting with Follow-up  on 04/07/2014  HPI: Here following up for acute lower abdominal and pelvic pain related to IUD placement.  We did an u/s to see if the IUD was somehow misplaced.  U/s shows normal placement.  We checked cultures, which were negative.  She continues to have pain and some dysfunctional bleeding.  She had previously been using it for contraception as well as for bleeding.   Review of Systems  Constitutional: Negative for fever and chills.  HENT: Negative for congestion.   Respiratory: Negative for shortness of breath.   Cardiovascular: Negative for leg swelling.  Gastrointestinal: Positive for abdominal pain. Negative for nausea and vomiting.  Genitourinary: Positive for menstrual problem and pelvic pain. Negative for dysuria.  Musculoskeletal: Negative for arthralgias.      Objective:    BP 128/74 mmHg  Pulse 82  Temp(Src) 98.6 F (37 C) (Oral)  Ht 5\' 10"  (1.778 m)  Wt 303 lb (137.44 kg)  BMI 43.48 kg/m2  LMP 02/21/2014 Physical Exam  Constitutional: She is oriented to person, place, and time. She appears well-developed and well-nourished. No distress.  HENT:  Head: Normocephalic and atraumatic.  Eyes: No scleral icterus.  Neck: Neck supple.  Cardiovascular: Normal rate.   Pulmonary/Chest: Effort normal.  Abdominal: Soft.  Neurological: She is alert and oriented to person, place, and time.  Skin: Skin is warm and dry.  Psychiatric: She has a normal mood and affect.    Procedure: Speculum placed inside vagina.  Cervix visualized.  Strings grasped with ring forceps.  IUD removed intact. Procedure: Patient given informed consent, signed copy in the chart, time out was performed. Appropriate time out taken.  Patient's left arm was prepped and draped in the usual sterile fashion.. The ruler used to measure and mark insertion area.  Pt was prepped with alcohol swab and then injected with 3  cc of 1% lidocaine with epinephrine.  Pt was prepped with betadine, Nexplanon removed form packaging,  Device confirmed in needle, then inserted full length of needle and withdrawn per handbook instructions.  Pt insertion site covered with pressure dressing.   Minimal blood loss.  Pt tolerated the procedure well.       Assessment & Plan:   Problem List Items Addressed This Visit      Unprioritized   Pelvic pain in female    Have removed possible offending IUD.  Replaced with Nexplanon to improve cycle control and for contraception.  Advised of bleeding profile.       Other Visit Diagnoses    DUB (dysfunctional uterine bleeding)    -  Primary    Relevant Medications    etonogestrel (NEXPLANON) implant 68 mg (Completed)      Return in about 4 weeks (around 05/05/2014) for a follow-up.

## 2014-04-08 DIAGNOSIS — F331 Major depressive disorder, recurrent, moderate: Secondary | ICD-10-CM | POA: Diagnosis not present

## 2014-04-09 DIAGNOSIS — F331 Major depressive disorder, recurrent, moderate: Secondary | ICD-10-CM | POA: Diagnosis not present

## 2014-04-11 DIAGNOSIS — F331 Major depressive disorder, recurrent, moderate: Secondary | ICD-10-CM | POA: Diagnosis not present

## 2014-04-12 ENCOUNTER — Encounter (HOSPITAL_COMMUNITY): Payer: Self-pay | Admitting: Clinical

## 2014-04-12 ENCOUNTER — Ambulatory Visit (INDEPENDENT_AMBULATORY_CARE_PROVIDER_SITE_OTHER): Payer: Medicare Other | Admitting: Clinical

## 2014-04-12 DIAGNOSIS — F411 Generalized anxiety disorder: Secondary | ICD-10-CM | POA: Diagnosis not present

## 2014-04-12 DIAGNOSIS — F331 Major depressive disorder, recurrent, moderate: Secondary | ICD-10-CM | POA: Diagnosis not present

## 2014-04-12 NOTE — Progress Notes (Signed)
   THERAPIST PROGRESS NOTE  Session Time: 8:03 -905  Participation Level: Active  Behavioral Response: CasualDrowsyDepressed  Type of Therapy: Individual Therapy  Treatment Goals addressed: improve psychiatric symptoms, calming skills, healthy coping skills (self-care), emotional regulations skills  Interventions: CBT, Motivational Interviewing and Other: Mindfulness and grounding techniques  Summary: Lindsay [name] is a 44 y.o. female who presents with Major Depressive Disorder, and Generalized Anxiety.   Suicidal/Homicidal: No    -   without intent/plan  Therapist Response:  Lindsay [name] met with clinician for an individual session. Lindsay [name] discussed her psychiatric symptoms, her current life events and her homework. Lindsay [name] shared that she had been felling tired and depressed. She shared that she is often running from appointment to appointment and caring for  either for her daughter or mother - both of which have significant health issues. Client shared that she does not have much time for herself and that when she does she feels too exhausted to do anything. Client and clinician discussed her homework. Lindsay [name] shared that while she has done some she has not done as well as she would like.. Client and clinician reviewed her homework and discussed ways she could do it within her time schedule. Lindsay [name] shared that she spends time in the waiting rooms. Client and clinician brainstormed how she could use this time to practice her calming techniques. Clinician introduced her to guided mindfulness meditations (apps / pod cast) Cherine agreed to try some before next session. Lindsay [name] shared she liked being out doors. Clinician encouraged her to take breaks in her yard if only for ten minutes to either do yard work (which she reported she enjoys) or to quite herself and ground. Lindsay [name] agreed to try. Clinician introduced cbt and client and clinician worked a Human resources officer together, Lindsay [name] reported  her emotions would be less anxious if she believed her healthier alternative thoughts. Client and clinician agreed to explore this further at next session  Plan: Return again in 2 weeks.  Diagnosis: Axis I: Major Depressive Disorder, and Generalized Anxiety       Masoud Nyce A, LCSW 04/12/2014

## 2014-04-13 DIAGNOSIS — F331 Major depressive disorder, recurrent, moderate: Secondary | ICD-10-CM | POA: Diagnosis not present

## 2014-04-15 DIAGNOSIS — F331 Major depressive disorder, recurrent, moderate: Secondary | ICD-10-CM | POA: Diagnosis not present

## 2014-04-16 DIAGNOSIS — F331 Major depressive disorder, recurrent, moderate: Secondary | ICD-10-CM | POA: Diagnosis not present

## 2014-04-18 DIAGNOSIS — F331 Major depressive disorder, recurrent, moderate: Secondary | ICD-10-CM | POA: Diagnosis not present

## 2014-04-19 DIAGNOSIS — F331 Major depressive disorder, recurrent, moderate: Secondary | ICD-10-CM | POA: Diagnosis not present

## 2014-04-20 DIAGNOSIS — F331 Major depressive disorder, recurrent, moderate: Secondary | ICD-10-CM | POA: Diagnosis not present

## 2014-04-21 ENCOUNTER — Telehealth: Payer: Self-pay | Admitting: General Practice

## 2014-04-21 DIAGNOSIS — N939 Abnormal uterine and vaginal bleeding, unspecified: Secondary | ICD-10-CM

## 2014-04-21 MED ORDER — DOXYCYCLINE HYCLATE 100 MG PO CAPS: 100.0000 mg | ORAL_CAPSULE | Freq: Two times a day (BID) | ORAL | Status: DC

## 2014-04-21 NOTE — Telephone Encounter (Signed)
Patient called and left message stating she is calling about her implant. States she is still having bleeding since January and feels weak, tired, and having hot flashes, please call back. Called patient and she states her bleeding has even worsened since coming in to see [name]. Called Dr Kennon Rounds who stated this is normal for patient to experience but can do trial of doxycycline 100mg  BID x 10 days to see if patient's bleeding improves. Discussed with patient that when changing birth control options bleeding is a side effect. Told patient we will send a medication to her pharmacy to see if that helps with the bleeding. She will need to take it twice a day for 10 days. Patient verbalized understanding to all and had no questions

## 2014-04-22 DIAGNOSIS — F331 Major depressive disorder, recurrent, moderate: Secondary | ICD-10-CM | POA: Diagnosis not present

## 2014-04-23 DIAGNOSIS — F331 Major depressive disorder, recurrent, moderate: Secondary | ICD-10-CM | POA: Diagnosis not present

## 2014-04-25 DIAGNOSIS — F331 Major depressive disorder, recurrent, moderate: Secondary | ICD-10-CM | POA: Diagnosis not present

## 2014-04-26 DIAGNOSIS — F331 Major depressive disorder, recurrent, moderate: Secondary | ICD-10-CM | POA: Diagnosis not present

## 2014-04-27 DIAGNOSIS — F331 Major depressive disorder, recurrent, moderate: Secondary | ICD-10-CM | POA: Diagnosis not present

## 2014-04-28 DIAGNOSIS — F331 Major depressive disorder, recurrent, moderate: Secondary | ICD-10-CM | POA: Diagnosis not present

## 2014-04-29 DIAGNOSIS — F331 Major depressive disorder, recurrent, moderate: Secondary | ICD-10-CM | POA: Diagnosis not present

## 2014-05-03 ENCOUNTER — Encounter: Payer: Self-pay | Admitting: General Practice

## 2014-05-03 DIAGNOSIS — F331 Major depressive disorder, recurrent, moderate: Secondary | ICD-10-CM | POA: Diagnosis not present

## 2014-05-04 ENCOUNTER — Ambulatory Visit (INDEPENDENT_AMBULATORY_CARE_PROVIDER_SITE_OTHER): Payer: Medicare Other | Admitting: Psychiatry

## 2014-05-04 ENCOUNTER — Encounter (HOSPITAL_COMMUNITY): Payer: Self-pay | Admitting: Psychiatry

## 2014-05-04 VITALS — BP 134/80 | HR 81 | Ht 70.0 in | Wt 315.0 lb

## 2014-05-04 DIAGNOSIS — F411 Generalized anxiety disorder: Secondary | ICD-10-CM | POA: Diagnosis not present

## 2014-05-04 DIAGNOSIS — F331 Major depressive disorder, recurrent, moderate: Secondary | ICD-10-CM | POA: Diagnosis not present

## 2014-05-04 MED ORDER — ARIPIPRAZOLE 5 MG PO TABS: 5.0000 mg | ORAL_TABLET | Freq: Every day | ORAL | Status: DC

## 2014-05-04 MED ORDER — CLORAZEPATE DIPOTASSIUM 15 MG PO TABS: 15.0000 mg | ORAL_TABLET | Freq: Every morning | ORAL | Status: DC

## 2014-05-04 MED ORDER — BUPROPION HCL ER (XL) 300 MG PO TB24: ORAL_TABLET | ORAL | Status: DC

## 2014-05-04 MED ORDER — GABAPENTIN 400 MG PO CAPS: 400.0000 mg | ORAL_CAPSULE | Freq: Every day | ORAL | Status: DC
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
## 2014-05-05 DIAGNOSIS — F331 Major depressive disorder, recurrent, moderate: Secondary | ICD-10-CM | POA: Diagnosis not present

## 2014-05-11 ENCOUNTER — Encounter: Payer: Self-pay | Admitting: Internal Medicine

## 2014-05-11 ENCOUNTER — Ambulatory Visit (HOSPITAL_COMMUNITY)
Admission: RE | Admit: 2014-05-11 | Discharge: 2014-05-11 | Disposition: A | Discharge status: Home / Self Care | Payer: Medicare Other | Source: Ambulatory Visit | Attending: Oncology | Admitting: Oncology

## 2014-05-11 ENCOUNTER — Ambulatory Visit (INDEPENDENT_AMBULATORY_CARE_PROVIDER_SITE_OTHER): Payer: Medicare Other | Admitting: Internal Medicine

## 2014-05-11 VITALS — BP 140/81 | HR 85 | Temp 98.3°F | Ht 70.0 in | Wt 316.6 lb

## 2014-05-11 DIAGNOSIS — N920 Excessive and frequent menstruation with regular cycle: Secondary | ICD-10-CM | POA: Diagnosis not present

## 2014-05-11 DIAGNOSIS — R5381 Other malaise: Secondary | ICD-10-CM

## 2014-05-11 DIAGNOSIS — M25571 Pain in right ankle and joints of right foot: Secondary | ICD-10-CM | POA: Diagnosis not present

## 2014-05-11 DIAGNOSIS — R51 Headache: Secondary | ICD-10-CM | POA: Diagnosis not present

## 2014-05-11 DIAGNOSIS — R635 Abnormal weight gain: Secondary | ICD-10-CM | POA: Insufficient documentation

## 2014-05-11 DIAGNOSIS — R5383 Other fatigue: Secondary | ICD-10-CM

## 2014-05-11 DIAGNOSIS — M799 Soft tissue disorder, unspecified: Secondary | ICD-10-CM | POA: Insufficient documentation

## 2014-05-11 DIAGNOSIS — N921 Excessive and frequent menstruation with irregular cycle: Secondary | ICD-10-CM | POA: Diagnosis not present

## 2014-05-11 DIAGNOSIS — M19071 Primary osteoarthritis, right ankle and foot: Secondary | ICD-10-CM | POA: Diagnosis not present

## 2014-05-11 DIAGNOSIS — F17210 Nicotine dependence, cigarettes, uncomplicated: Secondary | ICD-10-CM | POA: Diagnosis not present

## 2014-05-11 DIAGNOSIS — M7989 Other specified soft tissue disorders: Secondary | ICD-10-CM | POA: Diagnosis not present

## 2014-05-11 DIAGNOSIS — R109 Unspecified abdominal pain: Secondary | ICD-10-CM

## 2014-05-11 DIAGNOSIS — R519 Headache, unspecified: Secondary | ICD-10-CM

## 2014-05-11 DIAGNOSIS — G8929 Other chronic pain: Secondary | ICD-10-CM

## 2014-05-11 LAB — CBC WITH DIFFERENTIAL/PLATELET
BASOS ABS: 0 10*3/uL (ref 0.0–0.1)
BASOS PCT: 0 % (ref 0–1)
EOS PCT: 2 % (ref 0–5)
Eosinophils Absolute: 0.2 10*3/uL (ref 0.0–0.7)
HCT: 43 % (ref 36.0–46.0)
Hemoglobin: 14 g/dL (ref 12.0–15.0)
Lymphocytes Relative: 38 % (ref 12–46)
Lymphs Abs: 3.4 10*3/uL (ref 0.7–4.0)
MCH: 28.2 pg (ref 26.0–34.0)
MCHC: 32.6 g/dL (ref 30.0–36.0)
MCV: 86.7 fL (ref 78.0–100.0)
MONO ABS: 0.8 10*3/uL (ref 0.1–1.0)
MPV: 8.8 fL (ref 8.6–12.4)
Monocytes Relative: 9 % (ref 3–12)
Neutro Abs: 4.5 10*3/uL (ref 1.7–7.7)
Neutrophils Relative %: 51 % (ref 43–77)
PLATELETS: 345 10*3/uL (ref 150–400)
RBC: 4.96 MIL/uL (ref 3.87–5.11)
RDW: 14.9 % (ref 11.5–15.5)
WBC: 8.9 10*3/uL (ref 4.0–10.5)

## 2014-05-11 LAB — FERRITIN: FERRITIN: 39 ng/mL (ref 10–291)

## 2014-05-11 IMAGING — CR DG ANKLE COMPLETE 3+V*R*
3 series · 3 of 3 positions shown · non-contrast
Comparison: [DATE].

CLINICAL DATA: RIGHT ankle pain. Swelling. Symptoms for 3 months.
No injury. Lateral malleolar pain. Initial encounter.

EXAM:
RIGHT ANKLE - COMPLETE 3+ VIEW

[x ankle ap right]
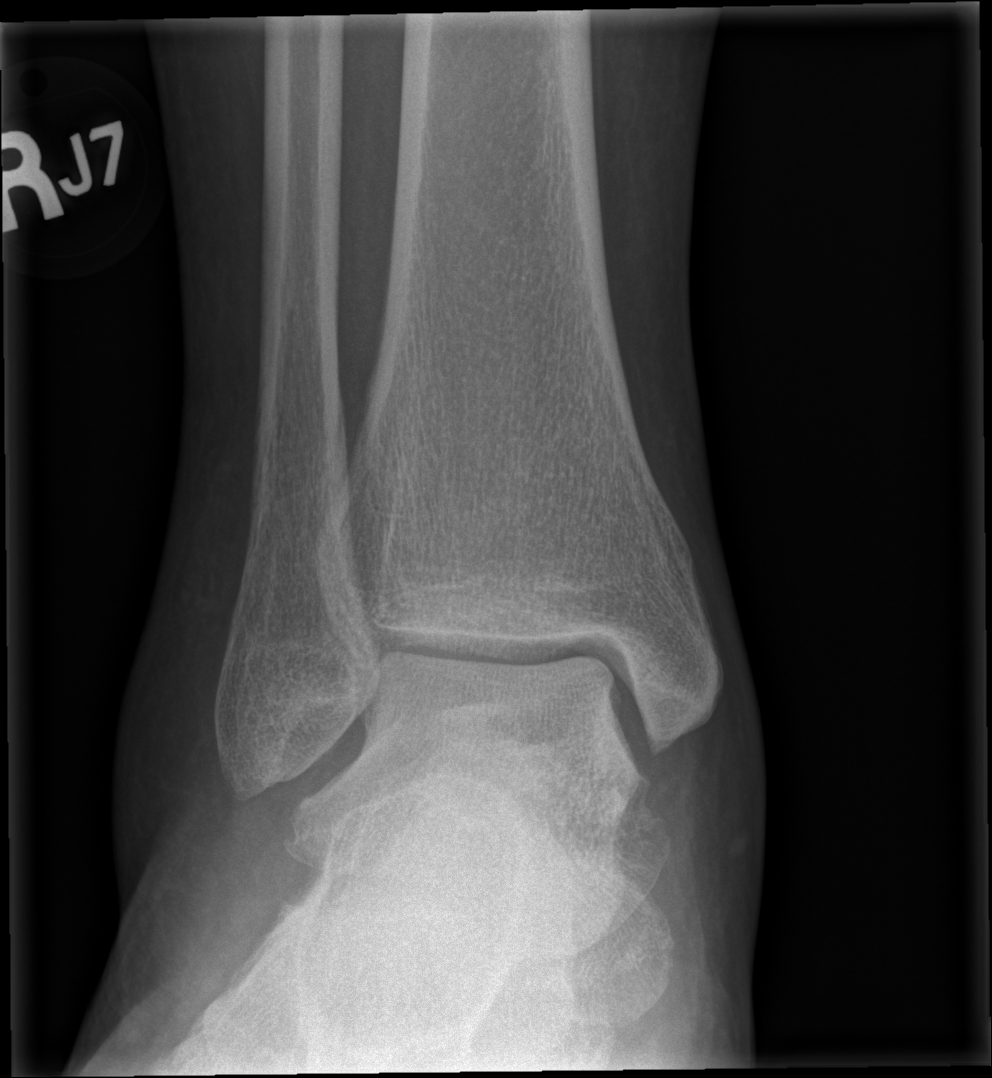

[x ankle obl right]
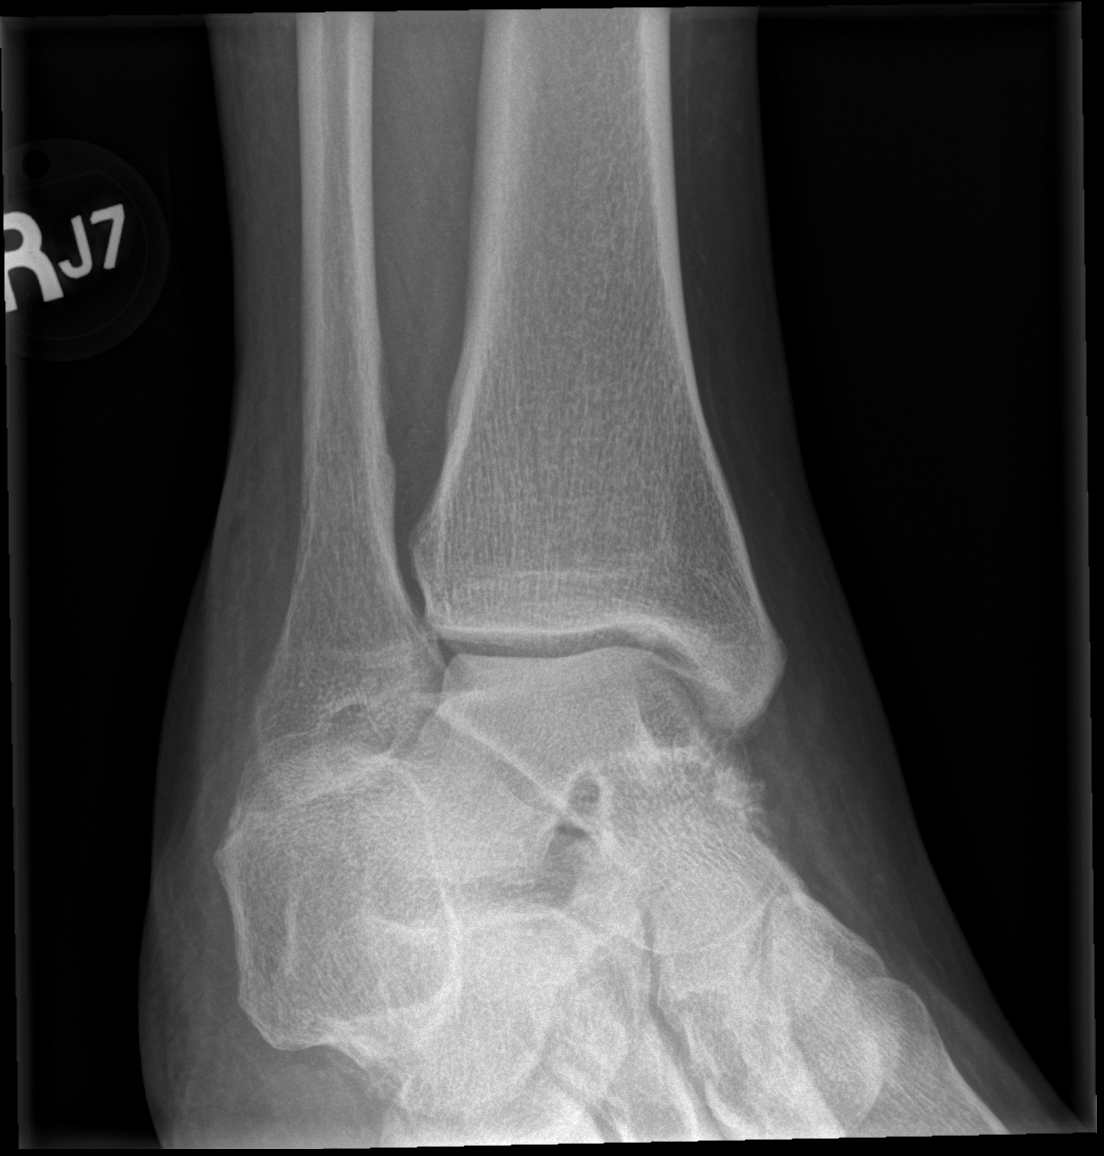

[x ankle lat right]
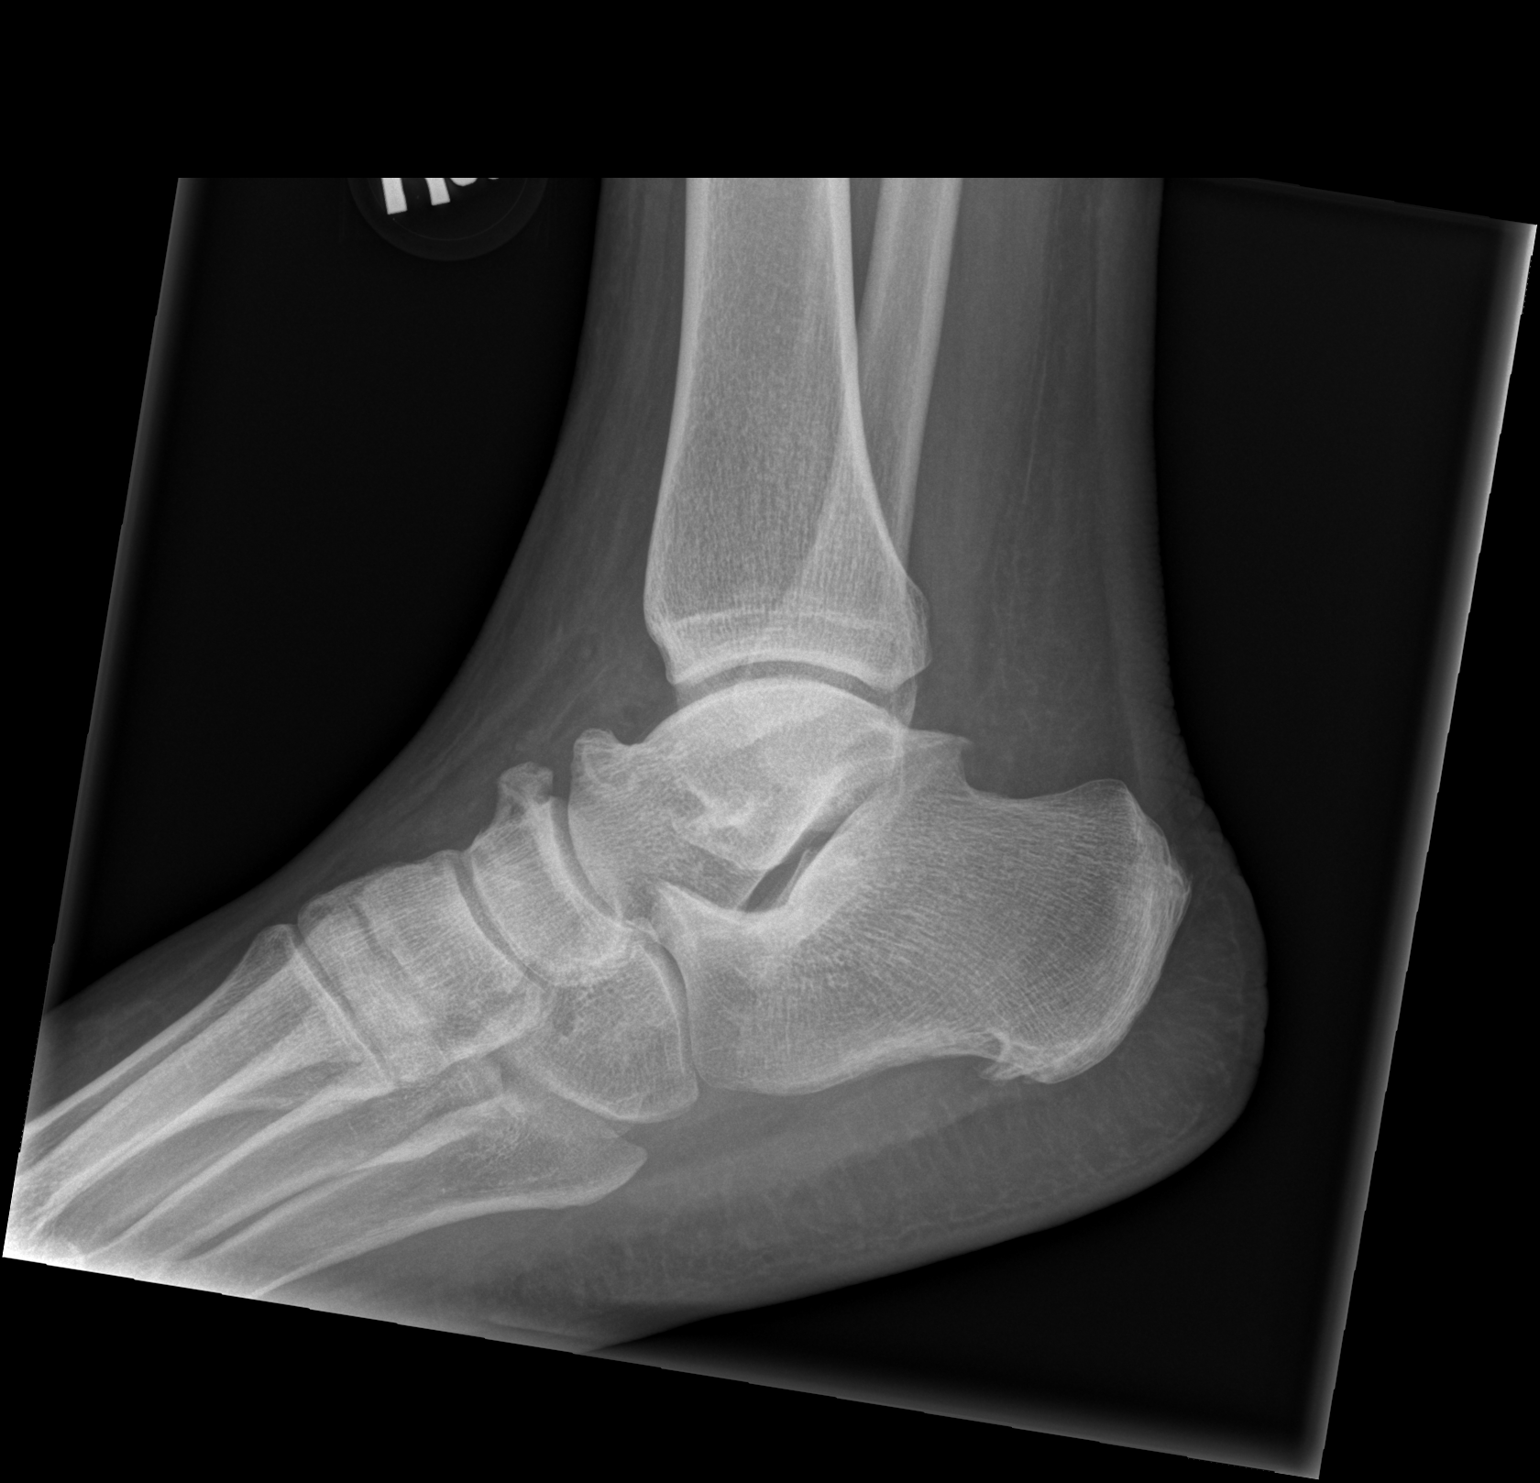

[3 of 3 positions shown; findings below may reference images not displayed]

FINDINGS: Anatomic alignment of the ankle. Talar dome is intact. No fracture.
Progressive talonavicular osteoarthritis is present with progressive
dorsal spurring at the talonavicular joint. Subtalar joint spaces
appear preserved. Relatively flat talar dome suggests an old injury
and with talonavicular osteoarthritis, posttraumatic osteoarthritis
is suspected. The posterior talus is hypoplastic. Calcaneal spurs
are incidentally noted.
IMPRESSION: No acute abnormality. Progressive talonavicular osteoarthritis and
hypoplastic talus, suggesting remote injury.

## 2014-05-11 MED ORDER — TOPIRAMATE 25 MG PO TABS: 25.0000 mg | ORAL_TABLET | Freq: Every day | ORAL | Status: DC

## 2014-05-11 NOTE — Assessment & Plan Note (Signed)
-  Patient has a 1cm dark tender lesion on her foot (reports has grown over 4 months) - Will refer to podiatry for further evaluation.

## 2014-05-11 NOTE — Patient Instructions (Signed)
General Instructions: Please start taking topiramate 25mg  once a day to reduce the number of headaches.  I will talk to you psychiatrist about potentially starting you on other medications than abilify as i am concerned this could be causing your  Weight gain.  Please bring your medicines with you each time you come to clinic.  Medicines may include prescription medications, over-the-counter medications, herbal remedies, eye drops, vitamins, or other pills.   Progress Toward Treatment Goals:  No flowsheet data found.  Self Care Goals & Plans:  Self Care Goal 10/20/2013  Manage my medications take my medicines as prescribed; bring my medications to every visit; refill my medications on time  Stop smoking cut down the number of cigarettes smoked    No flowsheet data found.   Care Management & Community Referrals:  No flowsheet data found.

## 2014-05-11 NOTE — Assessment & Plan Note (Signed)
-  Reports previous diagnosis of Migraine headaches which is consistent with her reports.  She reports she was previously followed at the "[facility]." She does not think Imitrex was effective, she has been on topiramate in the past. - Currently using diclofenac, reports 5 headahces a week - Will add low dose topiramate for prevention, which may have added benefit of weight loss.

## 2014-05-11 NOTE — Assessment & Plan Note (Signed)
Wt Readings from Last 5 Encounters:  05/11/14 316 lb 9.6 oz (143.609 kg)  05/04/14 315 lb (142.883 kg)  04/07/14 303 lb (137.44 kg)  03/25/14 298 lb (135.172 kg)  02/21/14 293 lb 14.4 oz (133.312 kg)   She has continued to gain weight. I have reviewed her weight chart which seems to show that her weight was stable around 205-215 until late 2014 when she started to steadily gain weight.  She reports she is not eating more food in fact is eating less and still having weight gain.  She has discussed this with her psychiatrist who does not feel this is due to abilify and she was tried on a lower dose but still had weight gain. I am concerned that this could still be a medication side effect of abilify

## 2014-05-11 NOTE — Assessment & Plan Note (Signed)
-  Ankle pain since 3 months ago, no trauma. - Obtain Xray today

## 2014-05-11 NOTE — Progress Notes (Signed)
Medicine attending: I personally interviewed and briefly examined this patient and reviewed pertinent clinical data together with resident physician Dr.Erik Heber Robins and I concur with his evaluation and management plan. Patient weight gain clearly correlated with initiation of abilify in 2014 and has not improved despite dose decrease. We will communicate with her Psychiatrist to find a suitable substitution. She had a tender, hyperpigmented, non-ulcerated lesion along medial border, right foot, growing over last 4 months.  Will will make a Podiatry referral for further eval. A melanoma would not be typically tender

## 2014-05-11 NOTE — Progress Notes (Signed)
[facility] Subjective:   Patient ID: Lindsay [name] female   DOB: 1970/04/15 44 y.o.   MRN: [id number]  HPI: [name] is a 44 y.o. female with a PMH detailed below who presents for abdominal pain.  Patient reports she has had abdominal fullness and abdominal pain for a few months  (she does have history of chronic abdominal pain over the past number of years documented in our EHR). She has been seeing OBgyn and had and IUD replaced, removed, replaced again (confirmed in correct position with U/S) and removed again.  She now has the Nexplanon implant to improve her dysmenorrhea.  She reports associated symptoms of occasional nausea and abdominal bloating.  She also reports decreased energy.    Weight gain: review of her chart appears that she was previously at a fairly steady weight of 205-215 until late 2014 when she started steadily gaining weight to 316 today.  Per [facility] she started Abilify in 10-2012.  She notes that her psychiatrist dried a lower dose to see if this was due to the medication but that she continued to gain weight.  She has previously had an extensive workup for weight gain in August last year with a CMP, CBC, TSH, and A1c, all of which was normal.  She denies increased oral intake.  She also reports a lesion on her right foot which has grown in size since she first noticed it in January.  She reports it is painful, it has not drained anything.  In addition she reports HA, please see further details under assessment and plan below  In addition she reports right ankle pain for the last 3 months, no history of trauma.      Past Medical History  Diagnosis Date  . Depression with anxiety   . Hypertension   . Anorexia   . IBS (irritable bowel syndrome)   . Low back pain   . Edema leg   . Hemorrhoids   . Panic attacks   . Anemia     Iron Def  . Colon polyp 2009  . Migraines   . Colon polyps   . Neuromuscular disorder     fibromyalgia   . Tonsil pain   . Polyp of vocal cord or larynx   . Hidradenitis suppurativa   . Depression   . Chronic kidney disease     Nephrotic syndrome   Current Outpatient Prescriptions  Medication Sig Dispense Refill  . ARIPiprazole (ABILIFY) 5 MG tablet Take 1 tablet (5 mg total) by mouth daily. 30 tablet 2  . buPROPion (WELLBUTRIN XL) 300 MG 24 hr tablet TAKE 1 TABLET (300 MG TOTAL) BY MOUTH EVERY MORNING. 30 tablet 2  . clorazepate (TRANXENE) 15 MG tablet Take 1 tablet (15 mg total) by mouth every morning. 30 tablet 2  . diclofenac (VOLTAREN) 75 MG EC tablet Take 1 tablet (75 mg total) by mouth 2 (two) times daily with a meal. (Patient taking differently: Take 75 mg by mouth daily. ) 60 tablet 2  . doxycycline (VIBRAMYCIN) 100 MG capsule Take 1 capsule (100 mg total) by mouth 2 (two) times daily. 20 capsule 0  . gabapentin (NEURONTIN) 400 MG capsule Take 1 capsule (400 mg total) by mouth daily. 30 capsule 2  . promethazine (PHENERGAN) 25 MG tablet Take 1 tablet (25 mg total) by mouth every 6 (six) hours as needed for nausea. 42 tablet 2  . topiramate (TOPAMAX) 25 MG tablet Take 1 tablet (25 mg total) by mouth  daily. 30 tablet 2   No current facility-administered medications for this visit.   Family History  Problem Relation Age of Onset  . Colon cancer Neg Hx   . Diabetes Mother   . Heart disease Mother     valve leak  . Anxiety disorder Mother   . Depression Mother   . Cancer Father     prostate  . Heart disease Father   . Learning disabilities Sister   . Depression Sister   . Depression Sister   . Anxiety disorder Sister    History   Social History  . Marital Status: Single    Spouse Name: N/A  . Number of Children: N/A  . Years of Education: N/A   Social History Main Topics  . Smoking status: Current Some Day Smoker -- 0.50 packs/day for 25 years    Types: Cigarettes  . Smokeless tobacco: Never Used     Comment: 10 cigarette a day  . Alcohol Use: No     Comment:  Occasional  . Drug Use: No  . Sexual Activity: No     Comment: States she has a Lenda Kelp that was placed aprroximately 2 years ago   Other Topics Concern  . None   Social History Narrative   Review of Systems: Review of Systems  Constitutional: Negative for fever and chills.  Respiratory: Negative for cough and shortness of breath.   Cardiovascular: Negative for chest pain.  Gastrointestinal: Positive for nausea and abdominal pain. Negative for vomiting, diarrhea, constipation and blood in stool.  Genitourinary: Negative for dysuria.  Musculoskeletal: Positive for joint pain.  Neurological: Positive for headaches. Negative for dizziness.  Psychiatric/Behavioral: Positive for depression. Negative for suicidal ideas.     Objective:  Physical Exam: Filed Vitals:   05/11/14 0906  BP: 140/81  Pulse: 85  Temp: 98.3 F (36.8 C)  TempSrc: Oral  Height: 5\' 10"  (1.778 m)  Weight: 316 lb 9.6 oz (143.609 kg)  SpO2: 100%  Physical Exam  Constitutional:  Obese female  HENT:  Head: Normocephalic and atraumatic.  Neck:  No appreciated "buffalo hump"  Cardiovascular: Normal rate and regular rhythm.   Pulmonary/Chest: Effort normal and breath sounds normal.  Abdominal: Soft. Bowel sounds are normal. There is no tenderness.  obese  Musculoskeletal: She exhibits no edema.  Right ankle normal temperature, normal active and passive ROM, no tenderness. Right foot has a 1cm darked raised lesion at the medial aspect of the arch of the foot, this is tender to palpation.  Psychiatric: She is not agitated. She exhibits a depressed mood.  tearful  Nursing note and vitals reviewed.   Assessment & Plan:  Case discussed with Dr. Beryle Beams  Right ankle pain -Ankle pain since 3 months ago, no trauma. - Obtain Xray today   Headache -Reports previous diagnosis of Migraine headaches which is consistent with her reports.  She reports she was previously followed at the "[facility] center." She  does not think Imitrex was effective, she has been on topiramate in the past. - Currently using diclofenac, reports 5 headahces a week - Will add low dose topiramate for prevention, which may have added benefit of weight loss.   Soft tissue lesion of foot -Patient has a 1cm dark tender lesion on her foot (reports has grown over 4 months) - Will refer to podiatry for further evaluation.   Weight gain Wt Readings from Last 5 Encounters:  05/11/14 316 lb 9.6 oz (143.609 kg)  05/04/14 315 lb (142.883 kg)  04/07/14  303 lb (137.44 kg)  03/25/14 298 lb (135.172 kg)  02/21/14 293 lb 14.4 oz (133.312 kg)   She has continued to gain weight. I have reviewed her weight chart which seems to show that her weight was stable around 205-215 until late 2014 when she started to steadily gain weight.  She reports she is not eating more food in fact is eating less and still having weight gain.  She has discussed this with her psychiatrist who does not feel this is due to abilify and she was tried on a lower dose but still had weight gain. I am concerned that this could still be a medication side effect of abilify     Medications Ordered Meds ordered this encounter  Medications  . topiramate (TOPAMAX) 25 MG tablet    Sig: Take 1 tablet (25 mg total) by mouth daily.    Dispense:  30 tablet    Refill:  2   Other Orders Orders Placed This Encounter  Procedures  . DG Ankle Complete Right    Standing Status: Future     Number of Occurrences:      Standing Expiration Date: 07/11/2015    Order Specific Question:  Reason for Exam (SYMPTOM  OR DIAGNOSIS REQUIRED)    Answer:  right ankle pain for 3 months, no history of trauma    Order Specific Question:  Is the patient pregnant?    Answer:  No    Order Specific Question:  Preferred imaging location?    Answer:  [facility]  . CBC with Diff  . Ferritin  . Ambulatory referral to Podiatry    Referral Priority:  Routine    Referral Type:   Consultation    Referral Reason:  Specialty Services Required    Requested Specialty:  Podiatry    Number of Visits Requested:  1

## 2014-05-12 ENCOUNTER — Encounter: Payer: Self-pay | Admitting: Internal Medicine

## 2014-05-12 DIAGNOSIS — F331 Major depressive disorder, recurrent, moderate: Secondary | ICD-10-CM | POA: Diagnosis not present

## 2014-05-17 DIAGNOSIS — F331 Major depressive disorder, recurrent, moderate: Secondary | ICD-10-CM | POA: Diagnosis not present

## 2014-05-18 ENCOUNTER — Encounter: Payer: Self-pay | Admitting: *Deleted

## 2014-05-19 DIAGNOSIS — F331 Major depressive disorder, recurrent, moderate: Secondary | ICD-10-CM | POA: Diagnosis not present

## 2014-05-24 DIAGNOSIS — F331 Major depressive disorder, recurrent, moderate: Secondary | ICD-10-CM | POA: Diagnosis not present

## 2014-05-26 DIAGNOSIS — F331 Major depressive disorder, recurrent, moderate: Secondary | ICD-10-CM | POA: Diagnosis not present

## 2014-05-30 ENCOUNTER — Other Ambulatory Visit: Payer: Self-pay | Admitting: Family Medicine

## 2014-06-03 ENCOUNTER — Telehealth: Payer: Self-pay | Admitting: *Deleted

## 2014-06-03 NOTE — Telephone Encounter (Signed)
Pt called stating Topamax is not and has never helped her headaches.   Can she try something else?   She also never heard back about her results of her Ankle xrays, a letter was mailed out with lab results.  Pt # [id number]

## 2014-06-08 NOTE — Telephone Encounter (Signed)
I called Ms Paullin back and discussed the results of her ankle Xray and told her to continue Diclofenac, weight loss, and exercise. She reports she is still having the same number of HA and doesn't think Topimax is working, she would like to be referred to a HA clinic.  I discussed that we will need to get her back in clinic with her PCP to discuss that referral.  Front desk could you please schedule her a visit with her PCP at the next available time?  -Cindee Salt

## 2014-06-10 ENCOUNTER — Encounter: Payer: Self-pay | Admitting: *Deleted

## 2014-06-29 ENCOUNTER — Ambulatory Visit: Payer: Medicare Other | Admitting: Podiatry

## 2014-07-02 ENCOUNTER — Emergency Department (HOSPITAL_COMMUNITY)
Admission: EM | Admit: 2014-07-02 | Discharge: 2014-07-02 | Disposition: A | Discharge status: Home / Self Care | Payer: Medicare Other | Attending: Emergency Medicine | Admitting: Emergency Medicine

## 2014-07-02 ENCOUNTER — Encounter (HOSPITAL_COMMUNITY): Payer: Self-pay | Admitting: Emergency Medicine

## 2014-07-02 DIAGNOSIS — Z792 Long term (current) use of antibiotics: Secondary | ICD-10-CM | POA: Diagnosis not present

## 2014-07-02 DIAGNOSIS — Z86010 Personal history of colonic polyps: Secondary | ICD-10-CM | POA: Diagnosis not present

## 2014-07-02 DIAGNOSIS — Z79899 Other long term (current) drug therapy: Secondary | ICD-10-CM | POA: Insufficient documentation

## 2014-07-02 DIAGNOSIS — N189 Chronic kidney disease, unspecified: Secondary | ICD-10-CM | POA: Insufficient documentation

## 2014-07-02 DIAGNOSIS — Z862 Personal history of diseases of the blood and blood-forming organs and certain disorders involving the immune mechanism: Secondary | ICD-10-CM | POA: Diagnosis not present

## 2014-07-02 DIAGNOSIS — Z872 Personal history of diseases of the skin and subcutaneous tissue: Secondary | ICD-10-CM | POA: Insufficient documentation

## 2014-07-02 DIAGNOSIS — Z720 Tobacco use: Secondary | ICD-10-CM | POA: Diagnosis not present

## 2014-07-02 DIAGNOSIS — Z3202 Encounter for pregnancy test, result negative: Secondary | ICD-10-CM | POA: Diagnosis not present

## 2014-07-02 DIAGNOSIS — L0231 Cutaneous abscess of buttock: Secondary | ICD-10-CM | POA: Diagnosis not present

## 2014-07-02 DIAGNOSIS — F329 Major depressive disorder, single episode, unspecified: Secondary | ICD-10-CM | POA: Diagnosis not present

## 2014-07-02 DIAGNOSIS — F410 Panic disorder [episodic paroxysmal anxiety] without agoraphobia: Secondary | ICD-10-CM | POA: Diagnosis not present

## 2014-07-02 DIAGNOSIS — I129 Hypertensive chronic kidney disease with stage 1 through stage 4 chronic kidney disease, or unspecified chronic kidney disease: Secondary | ICD-10-CM | POA: Insufficient documentation

## 2014-07-02 DIAGNOSIS — G43909 Migraine, unspecified, not intractable, without status migrainosus: Secondary | ICD-10-CM | POA: Diagnosis not present

## 2014-07-02 DIAGNOSIS — M797 Fibromyalgia: Secondary | ICD-10-CM | POA: Insufficient documentation

## 2014-07-02 DIAGNOSIS — F418 Other specified anxiety disorders: Secondary | ICD-10-CM | POA: Diagnosis not present

## 2014-07-02 DIAGNOSIS — Z791 Long term (current) use of non-steroidal anti-inflammatories (NSAID): Secondary | ICD-10-CM | POA: Insufficient documentation

## 2014-07-02 DIAGNOSIS — R51 Headache: Secondary | ICD-10-CM | POA: Diagnosis present

## 2014-07-02 LAB — CBC WITH DIFFERENTIAL/PLATELET
BASOS PCT: 0 % (ref 0–1)
Basophils Absolute: 0 10*3/uL (ref 0.0–0.1)
Eosinophils Absolute: 0.1 10*3/uL (ref 0.0–0.7)
Eosinophils Relative: 1 % (ref 0–5)
HEMATOCRIT: 40.8 % (ref 36.0–46.0)
Hemoglobin: 13.5 g/dL (ref 12.0–15.0)
LYMPHS ABS: 2.4 10*3/uL (ref 0.7–4.0)
Lymphocytes Relative: 29 % (ref 12–46)
MCH: 28 pg (ref 26.0–34.0)
MCHC: 33.1 g/dL (ref 30.0–36.0)
MCV: 84.5 fL (ref 78.0–100.0)
MONO ABS: 0.8 10*3/uL (ref 0.1–1.0)
Monocytes Relative: 9 % (ref 3–12)
Neutro Abs: 5 10*3/uL (ref 1.7–7.7)
Neutrophils Relative %: 61 % (ref 43–77)
PLATELETS: 320 10*3/uL (ref 150–400)
RBC: 4.83 MIL/uL (ref 3.87–5.11)
RDW: 15 % (ref 11.5–15.5)
WBC: 8.4 10*3/uL (ref 4.0–10.5)

## 2014-07-02 LAB — COMPREHENSIVE METABOLIC PANEL
ALBUMIN: 3.8 g/dL (ref 3.5–5.0)
ALK PHOS: 69 U/L (ref 38–126)
ALT: 14 U/L (ref 14–54)
AST: 13 U/L — ABNORMAL LOW (ref 15–41)
Anion gap: 9 (ref 5–15)
BUN: 7 mg/dL (ref 6–20)
CALCIUM: 9 mg/dL (ref 8.9–10.3)
CHLORIDE: 109 mmol/L (ref 101–111)
CO2: 21 mmol/L — ABNORMAL LOW (ref 22–32)
Creatinine, Ser: 0.87 mg/dL (ref 0.44–1.00)
GFR calc Af Amer: >60 mL/min (ref >60)
GFR calc non Af Amer: >60 mL/min (ref >60)
Glucose, Bld: 90 mg/dL (ref 65–99)
Potassium: 3.9 mmol/L (ref 3.5–5.1)
Sodium: 139 mmol/L (ref 135–145)
TOTAL PROTEIN: 7.4 g/dL (ref 6.5–8.1)
Total Bilirubin: 0.6 mg/dL (ref 0.3–1.2)

## 2014-07-02 LAB — I-STAT BETA HCG BLOOD, ED (MC, WL, AP ONLY): I-stat hCG, quantitative: <5 m[IU]/mL (ref <5)

## 2014-07-02 MED ORDER — OXYCODONE-ACETAMINOPHEN 5-325 MG PO TABS
2.0000 | ORAL_TABLET | Freq: Once | ORAL | Status: AC
  Administered 2014-07-02: 2 via ORAL
  Filled 2014-07-02: qty 2

## 2014-07-02 MED ORDER — HYDROMORPHONE HCL 1 MG/ML IJ SOLN
2.0000 mg | Freq: Once | INTRAMUSCULAR | Status: AC
  Administered 2014-07-02: 2 mg via INTRAMUSCULAR
  Filled 2014-07-02: qty 2

## 2014-07-02 MED ORDER — OXYCODONE-ACETAMINOPHEN 5-325 MG PO TABS: 1.0000 | ORAL_TABLET | ORAL | Status: DC | PRN

## 2014-07-02 MED ORDER — SULFAMETHOXAZOLE-TRIMETHOPRIM 800-160 MG PO TABS: 1.0000 | ORAL_TABLET | Freq: Two times a day (BID) | ORAL | Status: AC

## 2014-07-02 MED ORDER — LIDOCAINE-EPINEPHRINE (PF) 2 %-1:200000 IJ SOLN
10.0000 mL | Freq: Once | INTRAMUSCULAR | Status: AC
  Administered 2014-07-02: 10 mL
  Filled 2014-07-02: qty 20

## 2014-07-02 MED ORDER — OXYCODONE-ACETAMINOPHEN 5-325 MG PO TABS
2.0000 | ORAL_TABLET | Freq: Once | ORAL | Status: DC
  Filled 2014-07-02: qty 2

## 2014-07-02 NOTE — ED Notes (Signed)
Pt here with c/o abscess to right buttock area x 3 days. Pt also c/o migraine headaches, neck pain, N/V. Pt thinks she is beginning to get an abscess in her right groin area.

## 2014-07-02 NOTE — Discharge Instructions (Signed)
Abscess °An abscess (boil or furuncle) is an infected area on or under the skin. This area is filled with yellowish-white fluid (pus) and other material (debris). °HOME CARE  °· Only take medicines as told by your doctor. °· If you were given antibiotic medicine, take it as directed. Finish the medicine even if you start to feel better. °· If gauze is used, follow your doctor's directions for changing the gauze. °· To avoid spreading the infection: °¨ Keep your abscess covered with a bandage. °¨ Wash your hands well. °¨ Do not share personal care items, towels, or whirlpools with others. °¨ Avoid skin contact with others. °· Keep your skin and clothes clean around the abscess. °· Keep all doctor visits as told. °GET HELP RIGHT AWAY IF:  °· You have more pain, puffiness (swelling), or redness in the wound site. °· You have more fluid or blood coming from the wound site. °· You have muscle aches, chills, or you feel sick. °· You have a fever. °MAKE SURE YOU:  °· Understand these instructions. °· Will watch your condition. °· Will get help right away if you are not doing well or get worse. °Document Released: 07/03/2007 Document Revised: 07/16/2011 Document Reviewed: 03/29/2011 °ExitCare® Patient Information ©2015 [employer]. This information is not intended to replace advice given to you by your health care provider. Make sure you discuss any questions you have with your health care provider. ° °

## 2014-07-02 NOTE — ED Provider Notes (Addendum)
CSN: [id number]     Arrival date & time 07/02/14  8127 History   First MD Initiated Contact with Patient 07/02/14 [phone]     Chief Complaint  Patient presents with  . Abscess  . Neck Pain  . Headache  . Emesis     (Consider location/radiation/quality/duration/timing/severity/associated sxs/prior Treatment) HPI 44 year old female with history of hidradenitis suppurativa who presents today complaining of area of swelling and tenderness to the right meal we'll area. She also feels that there is an abscess forming in the right groin area. The first abscess has been present for the past 2-3 days. There is worsening in nature. It is painful. The right groin area started about 2 days ago. She has not noted any definite swelling there. She has not done anything for pain. She was last on nonantibiotic approximately a month ago. She also complained of some headache. She states it is in the left side of her for head and radiates behind her ear and down into her neck. This is consistent with her prior migraine headaches. She has noted an episode of diaphoresis that started outside in the heat at the [employer] today. He also felt somewhat generally weak. She has not had any trauma to her head, history of IV drug abuse, exposure to any known meningitis, or other noted risk factors for meningitis or epidural abscess.  Past Medical History  Diagnosis Date  . Depression with anxiety   . Hypertension   . Anorexia   . IBS (irritable bowel syndrome)   . Low back pain   . Edema leg   . Hemorrhoids   . Panic attacks   . Anemia     Iron Def  . Colon polyp 2009  . Migraines   . Colon polyps   . Neuromuscular disorder     fibromyalgia  . Tonsil pain   . Polyp of vocal cord or larynx   . Hidradenitis suppurativa   . Depression   . Chronic kidney disease     Nephrotic syndrome   Past Surgical History  Procedure Laterality Date  . Hemorrhoid surgery      with Hidradenitis surgery   . Upper  gastrointestinal endoscopy    . Axillary hidradenitis excision    . Inguinal hidradenitis excision    . Tonsillectomy  10/18/2010    by Dr. Wilburn Cornelia   Family History  Problem Relation Age of Onset  . Colon cancer Neg Hx   . Diabetes Mother   . Heart disease Mother     valve leak  . Anxiety disorder Mother   . Depression Mother   . Cancer Father     prostate  . Heart disease Father   . Learning disabilities Sister   . Depression Sister   . Depression Sister   . Anxiety disorder Sister    History  Substance Use Topics  . Smoking status: Current Some Day Smoker -- 0.50 packs/day for 25 years    Types: Cigarettes  . Smokeless tobacco: Never Used     Comment: 10 cigarette a day  . Alcohol Use: No     Comment: Occasional   OB History    Gravida Para Term Preterm AB TAB SAB Ectopic Multiple Living   2 1   1 1          Review of Systems  All other systems reviewed and are negative.     Allergies  Review of patient's allergies indicates no known allergies.  Home Medications   Prior  to Admission medications   Medication Sig Start Date End Date Taking? Authorizing Provider  ARIPiprazole (ABILIFY) 5 MG tablet Take 1 tablet (5 mg total) by mouth daily. 05/04/14   Kathlee Nations, MD  buPROPion (WELLBUTRIN XL) 300 MG 24 hr tablet TAKE 1 TABLET (300 MG TOTAL) BY MOUTH EVERY MORNING. 05/04/14   Kathlee Nations, MD  clorazepate (TRANXENE) 15 MG tablet Take 1 tablet (15 mg total) by mouth every morning. 05/04/14   Kathlee Nations, MD  diclofenac (VOLTAREN) 75 MG EC tablet Take 1 tablet (75 mg total) by mouth 2 (two) times daily with a meal. Patient taking differently: Take 75 mg by mouth daily.  03/25/14   Donnamae Jude, MD  doxycycline (VIBRAMYCIN) 100 MG capsule Take 1 capsule (100 mg total) by mouth 2 (two) times daily. 04/21/14   Donnamae Jude, MD  gabapentin (NEURONTIN) 400 MG capsule Take 1 capsule (400 mg total) by mouth daily. 05/04/14   Kathlee Nations, MD  promethazine (PHENERGAN) 25 MG  tablet TAKE 1 TABLET (25 MG TOTAL) BY MOUTH EVERY 6 (SIX) HOURS AS NEEDED FOR NAUSEA. 05/30/14   Donnamae Jude, MD  topiramate (TOPAMAX) 25 MG tablet Take 1 tablet (25 mg total) by mouth daily. 05/11/14   Lucious Groves, DO   BP 131/72 mmHg  Pulse 100  Temp(Src) 99.1 F (37.3 C) (Oral)  Resp 18  Ht 5\' 11"  (1.803 m)  Wt 320 lb (145.151 kg)  BMI 44.65 kg/m2  SpO2 98%  LMP 05/11/2014 Physical Exam  Constitutional: She is oriented to person, place, and time. She appears well-developed and well-nourished.  Morbidly obese  HENT:  Head: Normocephalic and atraumatic.  Right Ear: External ear normal.  Left Ear: External ear normal.  Nose: Nose normal.  Mouth/Throat: Oropharynx is clear and moist.  Eyes: Conjunctivae and EOM are normal. Pupils are equal, round, and reactive to light.  Neck: Normal range of motion. Neck supple. No JVD present. No tracheal deviation present. No thyromegaly present.  Cardiovascular: Normal rate, regular rhythm, normal heart sounds and intact distal pulses.   Pulmonary/Chest: Effort normal and breath sounds normal. She has no wheezes.  Abdominal: Soft. Bowel sounds are normal. She exhibits no mass. There is no tenderness. There is no guarding.  Musculoskeletal: Normal range of motion.  Lymphadenopathy:    She has no cervical adenopathy.  Neurological: She is alert and oriented to person, place, and time. She has normal reflexes. No cranial nerve deficit or sensory deficit. Gait normal. GCS eye subscore is 4. GCS verbal subscore is 5. GCS motor subscore is 6.  Reflex Scores:      Bicep reflexes are 2+ on the right side and 2+ on the left side.      Patellar reflexes are 2+ on the right side and 2+ on the left side. Strength is normal and equal throughout. Cranial nerves grossly intact. Patient fluent. No gross ataxia and patient able to ambulate without difficulty.  Skin: Skin is warm and dry.     Tender, fluctuant area to medial right buttock does not extend  to perirectal area. Right groin diffusely mildly tender without evidence of induration or fluctuance.  Psychiatric: She has a normal mood and affect. Her behavior is normal. Judgment and thought content normal.  Nursing note and vitals reviewed.   ED Course  INCISION AND DRAINAGE Date/Time: 07/02/2014 10:10 AM Performed by: Pattricia Boss Authorized by: Pattricia Boss Consent: Verbal consent obtained. Risks and benefits: risks, benefits and  alternatives were discussed Consent given by: patient Required items: required blood products, implants, devices, and special equipment available Patient identity confirmed: verbally with patient Type: abscess Body area: anogenital Location details: gluteal cleft Anesthesia: local infiltration Local anesthetic: lidocaine 1% with epinephrine Patient sedated: no Scalpel size: 11 Incision type: single straight Complexity: simple Drainage: purulent Drainage amount: moderate Patient tolerance: Patient tolerated the procedure well with no immediate complications   (including critical care time) Labs Review Labs Reviewed - No data to display  Imaging Review No results found.   EKG Interpretation None      MDM   Final diagnoses:  Abscess, gluteal, right  Migraine without status migrainosus, not intractable, unspecified migraine type      44 year old female with hidradenitis and migraine headaches who presents today with abscess in the right gluteal area and migraine headache. She is treated here with pain medicine and I and D.. She is hemodynamically stable. I have noted the statement in the nurse's notes that her neck is stiff. On my exam, she has no neck stiffness. She does complain of pain from her headache radiating into her neck which is normal for her migraine headaches. I have discussed follow-up plans with her including antibodies pain medicine and she voices understanding.  Pattricia Boss, MD 07/02/14 [place],  MD 07/02/14 1027

## 2014-07-04 ENCOUNTER — Ambulatory Visit (INDEPENDENT_AMBULATORY_CARE_PROVIDER_SITE_OTHER): Payer: Medicare Other | Admitting: Internal Medicine

## 2014-07-04 ENCOUNTER — Encounter: Payer: Self-pay | Admitting: Internal Medicine

## 2014-07-04 VITALS — BP 133/81 | HR 92 | Temp 98.8°F | Wt 319.3 lb

## 2014-07-04 DIAGNOSIS — Z79899 Other long term (current) drug therapy: Secondary | ICD-10-CM

## 2014-07-04 DIAGNOSIS — G43001 Migraine without aura, not intractable, with status migrainosus: Secondary | ICD-10-CM

## 2014-07-04 LAB — POCT GLYCOSYLATED HEMOGLOBIN (HGB A1C): Hemoglobin A1C: 5.3

## 2014-07-04 LAB — GLUCOSE, CAPILLARY: Glucose-Capillary: 113 mg/dL — ABNORMAL HIGH (ref 65–99)

## 2014-07-04 MED ORDER — KETOROLAC TROMETHAMINE 60 MG/2ML IM SOLN
60.0000 mg | Freq: Once | INTRAMUSCULAR | Status: AC
  Administered 2014-07-04: 60 mg via INTRAMUSCULAR

## 2014-07-04 NOTE — Assessment & Plan Note (Addendum)
Though her weight gain could have been contributed by Abilify use, she does report not drinking water, not exercising due to tingling in her feet.  She has gained >15 lbs in 2 months and is interested in being screened for DM2.  POCT A1c today is normal at 5.3% She is interested in starting using the [facility] for exercise--application for scholarship was discussed with her during this visit.

## 2014-07-04 NOTE — Assessment & Plan Note (Addendum)
She has had headache in the past and used to be seen at the headache clinic, she is interested in being referred there again. Topamax did not improve the HA. The headache is present all over, worse when she does not sleep and stress out. Recently she has not slept much due to her daughter being hospitalized. She reports that her headaches used to improve with medications IM injection. She takes phenergan PRN for nausea due to headaches and has enough of this medication at home.  -Toradol 60mg  once -Referred to HA clinic -Pt to continue taking phenergan PRN for nausea due to headache.

## 2014-07-04 NOTE — Assessment & Plan Note (Addendum)
She had been on abilify which may increase incident of diabetes. Checked POCT HgA1C today that was normal at 5.3%.

## 2014-07-04 NOTE — Progress Notes (Signed)
Internal Medicine Clinic Attending  Case discussed with Dr. Kennerly at the time of the visit.  We reviewed the resident's history and exam and pertinent patient test results.  I agree with the assessment, diagnosis, and plan of care documented in the resident's note.  

## 2014-07-04 NOTE — Patient Instructions (Addendum)
General Instructions: -It was a pleasure taking care of you today! -Please call and make a follow up appointment with your psychiatrist to discuss the use of Abilify.  -You have been referred to the headache clinic. Try to keep a headache diary until then as provided to you today. Drink plenty of water, and sleep at least 7-8 hours per night.  -Starting regular exercises at the [facility] is an excellent plan for your weight loss goal. Let us know if we can further assist you with this goal.  -Follow up with [name] or go to the ED if the headache worsens.    Please bring your medicines with you each time you come to clinic.  Medicines may include prescription medications, over-the-counter medications, herbal remedies, eye drops, vitamins, or other pills.   Progress Toward Treatment Goals:  No flowsheet data found.  Self Care Goals & Plans:  Self Care Goal 07/04/2014  Manage my medications refill my medications on time  Stop smoking go to the [employer] website (https://scott-booker.info/); cut down the number of cigarettes smoked    No flowsheet data found.   Care Management & Community Referrals:  No flowsheet data found.    Migraine Headache A migraine headache is an intense, throbbing pain on one or both sides of your head. A migraine can last for 30 minutes to several hours. CAUSES  The exact cause of a migraine headache is not always known. However, a migraine may be caused when nerves in the brain become irritated and release chemicals that cause inflammation. This causes pain. Certain things may also trigger migraines, such as:  Alcohol.  Smoking.  Stress.  Menstruation.  Aged cheeses.  Foods or drinks that contain nitrates, glutamate, aspartame, or tyramine.  Lack of sleep.  Chocolate.  Caffeine.  Hunger.  Physical exertion.  Fatigue.  Medicines used to treat chest pain (nitroglycerine), birth control pills, estrogen, and some blood pressure medicines. SIGNS AND  SYMPTOMS  Pain on one or both sides of your head.  Pulsating or throbbing pain.  Severe pain that prevents daily activities.  Pain that is aggravated by any physical activity.  Nausea, vomiting, or both.  Dizziness.  Pain with exposure to bright lights, loud noises, or activity.  General sensitivity to bright lights, loud noises, or smells. Before you get a migraine, you may get warning signs that a migraine is coming (aura). An aura may include:  Seeing flashing lights.  Seeing bright spots, halos, or zigzag lines.  Having tunnel vision or blurred vision.  Having feelings of numbness or tingling.  Having trouble talking.  Having muscle weakness. DIAGNOSIS  A migraine headache is often diagnosed based on:  Symptoms.  Physical exam.  A CT scan or MRI of your head. These imaging tests cannot diagnose migraines, but they can help rule out other causes of headaches. TREATMENT Medicines may be given for pain and nausea. Medicines can also be given to help prevent recurrent migraines.  HOME CARE INSTRUCTIONS  Only take over-the-counter or prescription medicines for pain or discomfort as directed by your health care provider. The use of long-term narcotics is not recommended.  Lie down in a dark, quiet room when you have a migraine.  Keep a journal to find out what may trigger your migraine headaches. For example, write down:  What you eat and drink.  How much sleep you get.  Any change to your diet or medicines.  Limit alcohol consumption.  Quit smoking if you smoke.  Get 7-9 hours of  sleep, or as recommended by your health care provider.  Limit stress.  Keep lights dim if bright lights bother you and make your migraines worse. SEEK IMMEDIATE MEDICAL CARE IF:   Your migraine becomes severe.  You have a fever.  You have a stiff neck.  You have vision loss.  You have muscular weakness or loss of muscle control.  You start losing your balance or have  trouble walking.  You feel faint or pass out.  You have severe symptoms that are different from your first symptoms. MAKE SURE YOU:   Understand these instructions.  Will watch your condition.  Will get help right away if you are not doing well or get worse. Document Released: 01/14/2005 Document Revised: 05/31/2013 Document Reviewed: 09/21/2012 [facility] Patient Information 2015 [place], [place]. This information is not intended to replace advice given to you by your health care provider. Make sure you discuss any questions you have with your health care provider.  Exercise to Lose Weight Exercise and a healthy diet may help you lose weight. Your doctor may suggest specific exercises. EXERCISE IDEAS AND TIPS  Choose low-cost things you enjoy doing, such as walking, bicycling, or exercising to workout videos.  Take stairs instead of the elevator.  Walk during your lunch break.  Park your car further away from work or school.  Go to a gym or an exercise class.  Start with 5 to 10 minutes of exercise each day. Build up to 30 minutes of exercise 4 to 6 days a week.  Wear shoes with good support and comfortable clothes.  Stretch before and after working out.  Work out until you breathe harder and your heart beats faster.  Drink extra water when you exercise.  Do not do so much that you hurt yourself, feel dizzy, or get very short of breath. Exercises that burn about 150 calories:  Running 1  miles in 15 minutes.  Playing volleyball for 45 to 60 minutes.  Washing and waxing a car for 45 to 60 minutes.  Playing touch football for 45 minutes.  Walking 1  miles in 35 minutes.  Pushing a stroller 1  miles in 30 minutes.  Playing basketball for 30 minutes.  Raking leaves for 30 minutes.  Bicycling 5 miles in 30 minutes.  Walking 2 miles in 30 minutes.  Dancing for 30 minutes.  Shoveling snow for 15 minutes.  Swimming laps for 20 minutes.  Walking up stairs  for 15 minutes.  Bicycling 4 miles in 15 minutes.  Gardening for 30 to 45 minutes.  Jumping rope for 15 minutes.  Washing windows or floors for 45 to 60 minutes. Document Released: 02/16/2010 Document Revised: 04/08/2011 Document Reviewed: 02/16/2010 [facility] Patient Information 2015 [place], [place]. This information is not intended to replace advice given to you by your health care provider. Make sure you discuss any questions you have with your health care provider.  Calorie Counting for Weight Loss Calories are energy you get from the things you eat and drink. Your body uses this energy to keep you going throughout the day. The number of calories you eat affects your weight. When you eat more calories than your body needs, your body stores the extra calories as fat. When you eat fewer calories than your body needs, your body burns fat to get the energy it needs. Calorie counting means keeping track of how many calories you eat and drink each day. If you make sure to eat fewer calories than your body needs,  you should lose weight. In order for calorie counting to work, you will need to eat the number of calories that are right for you in a day to lose a healthy amount of weight per week. A healthy amount of weight to lose per week is usually 1-2 lb (0.5-0.9 kg). A dietitian can determine how many calories you need in a day and give you suggestions on how to reach your calorie goal.  WHAT IS MY MY PLAN? My goal is to have __________ calories per day.  If I have this many calories per day, I should lose around __________ pounds per week. WHAT DO I NEED TO KNOW ABOUT CALORIE COUNTING? In order to meet your daily calorie goal, you will need to:  Find out how many calories are in each food you would like to eat. Try to do this before you eat.  Decide how much of the food you can eat.  Write down what you ate and how many calories it had. Doing this is called keeping a food log. WHERE DO I FIND  CALORIE INFORMATION? The number of calories in a food can be found on a Nutrition Facts label. Note that all the information on a label is based on a specific serving of the food. If a food does not have a Nutrition Facts label, try to look up the calories online or ask your dietitian for help. HOW DO I DECIDE HOW MUCH TO EAT? To decide how much of the food you can eat, you will need to consider both the number of calories in one serving and the size of one serving. This information can be found on the Nutrition Facts label. If a food does not have a Nutrition Facts label, look up the information online or ask your dietitian for help. Remember that calories are listed per serving. If you choose to have more than one serving of a food, you will have to multiply the calories per serving by the amount of servings you plan to eat. For example, the label on a package of bread might say that a serving size is 1 slice and that there are 90 calories in a serving. If you eat 1 slice, you will have eaten 90 calories. If you eat 2 slices, you will have eaten 180 calories. HOW DO I KEEP A FOOD LOG? After each meal, record the following information in your food log:  What you ate.  How much of it you ate.  How many calories it had.  Then, add up your calories. Keep your food log near you, such as in a small notebook in your pocket. Another option is to use a mobile app or website. Some programs will calculate calories for you and show you how many calories you have left each time you add an item to the log. WHAT ARE SOME CALORIE COUNTING TIPS?  Use your calories on foods and drinks that will fill you up and not leave you hungry. Some examples of this include foods like nuts and nut butters, vegetables, lean proteins, and high-fiber foods (more than 5 g fiber per serving).  Eat nutritious foods and avoid empty calories. Empty calories are calories you get from foods or beverages that do not have many  nutrients, such as candy and soda. It is better to have a nutritious high-calorie food (such as an avocado) than a food with few nutrients (such as a bag of chips).  Know how many calories are in the foods you  eat most often. This way, you do not have to look up how many calories they have each time you eat them.  Look out for foods that may seem like low-calorie foods but are really high-calorie foods, such as baked goods, soda, and fat-free candy.  Pay attention to calories in drinks. Drinks such as sodas, specialty coffee drinks, alcohol, and juices have a lot of calories yet do not fill you up. Choose low-calorie drinks like water and diet drinks.  Focus your calorie counting efforts on higher calorie items. Logging the calories in a garden salad that contains only vegetables is less important than calculating the calories in a milk shake.  Find a way of tracking calories that works for you. Get creative. Most people who are successful find ways to keep track of how much they eat in a day, even if they do not count every calorie. WHAT ARE SOME PORTION CONTROL TIPS?  Know how many calories are in a serving. This will help you know how many servings of a certain food you can have.  Use a measuring cup to measure serving sizes. This is helpful when you start out. With time, you will be able to estimate serving sizes for some foods.  Take some time to put servings of different foods on your favorite plates, bowls, and cups so you know what a serving looks like.  Try not to eat straight from a bag or box. Doing this can lead to overeating. Put the amount you would like to eat in a cup or on a plate to make sure you are eating the right portion.  Use smaller plates, glasses, and bowls to prevent overeating. This is a quick and easy way to practice portion control. If your plate is smaller, less food can fit on it.  Try not to multitask while eating, such as watching TV or using your computer. If  it is time to eat, sit down at a table and enjoy your food. Doing this will help you to start recognizing when you are full. It will also make you more aware of what and how much you are eating. HOW CAN I CALORIE COUNT WHEN EATING OUT?  Ask for smaller portion sizes or child-sized portions.  Consider sharing an entree and sides instead of getting your own entree.  If you get your own entree, eat only half. Ask for a box at the beginning of your meal and put the rest of your entree in it so you are not tempted to eat it.  Look for the calories on the menu. If calories are listed, choose the lower calorie options.  Choose dishes that include vegetables, fruits, whole grains, low-fat dairy products, and lean protein. Focusing on smart food choices from each of the 5 food groups can help you stay on track at restaurants.  Choose items that are boiled, broiled, grilled, or steamed.  Choose water, milk, unsweetened iced tea, or other drinks without added sugars. If you want an alcoholic beverage, choose a lower calorie option. For example, a regular margarita can have up to 700 calories and a glass of wine has around 150.  Stay away from items that are buttered, battered, fried, or served with cream sauce. Items labeled "crispy" are usually fried, unless stated otherwise.  Ask for dressings, sauces, and syrups on the side. These are usually very high in calories, so do not eat much of them.  Watch out for salads. Many people think salads are a healthy  option, but this is often not the case. Many salads come with bacon, fried chicken, lots of cheese, fried chips, and dressing. All of these items have a lot of calories. If you want a salad, choose a garden salad and ask for grilled meats or steak. Ask for the dressing on the side, or ask for olive oil and vinegar or lemon to use as dressing.  Estimate how many servings of a food you are given. For example, a serving of cooked rice is  cup or about  the size of half a tennis ball or one cupcake wrapper. Knowing serving sizes will help you be aware of how much food you are eating at restaurants. The list below tells you how big or small some common portion sizes are based on everyday objects.  1 oz--4 stacked dice.  3 oz--1 deck of cards.  1 tsp--1 dice.  1 Tbsp-- a Ping-Pong ball.  2 Tbsp--1 Ping-Pong ball.   cup--1 tennis ball or 1 cupcake wrapper.  1 cup--1 baseball. Document Released: 01/14/2005 Document Revised: 05/31/2013 Document Reviewed: 11/19/2012 [facility] Patient Information 2015 [place], [place]. This information is not intended to replace advice given to you by your health care provider. Make sure you discuss any questions you have with your health care provider.

## 2014-07-04 NOTE — Progress Notes (Signed)
   Subjective:    Patient ID: Lindsay [name], female    DOB: August 20, 1970, 44 y.o.   MRN: [id number]  HPI  Ms. Wilds is a 44 yr old woman with PMH of major depression disorder, anxiety, hidradenitis suppurative who presents for follow up visit for migraine headache and weight gain. She states that she has stopped taking abilify since she say Dr. Heber [place] but still has gained weight. She describes sensation of bloatness at times with stomach discomfort that improves when she takes omeprazole.  She complains of tingling in her feet and is interesting in being screened for diabetes. She had labs done this past weekend in the ED when she was seen for a boil in her buttocks that was drained. She is taking antibiotic for this boil and states that it is improving.  She is under a lot of stress with her daughter in the hospital currently for a flair up of HS. She has not slept well and is not drinking much water and notes that her headache worsened in the past 1.5 weeks. Her headache is accompanied by sensitivity to light, nausea, and is all over her head, sometimes going down her shoulders. Has had no fever/chills, rash, neck stiffness.    Review of Systems  Constitutional: Negative for fever, chills, activity change, appetite change and fatigue.  HENT: Negative for congestion, ear pain, postnasal drip, rhinorrhea, sinus pressure and sore throat.   Eyes: Negative for pain.  Respiratory: Negative for cough and shortness of breath.   Cardiovascular: Negative for chest pain and leg swelling.  Gastrointestinal: Negative for abdominal pain, diarrhea and constipation.  Genitourinary: Negative for dysuria and difficulty urinating.  Neurological: Positive for headaches. Negative for dizziness and weakness.  Psychiatric/Behavioral: Negative for agitation.       Objective:   Physical Exam  Constitutional: She is oriented to person, place, and time. No distress.  Morbidly obese  HENT:  Head: Normocephalic and  atraumatic.  Eyes: Conjunctivae are normal. Right eye exhibits no discharge. Left eye exhibits no discharge. No scleral icterus.  Cardiovascular: Normal rate.   Pulmonary/Chest: Effort normal. No respiratory distress. She has no wheezes. She has no rales.  Abdominal: Soft. There is no tenderness. There is no rebound.  Musculoskeletal: She exhibits no edema.  Neurological: She is alert and oriented to person, place, and time. Coordination normal.  No neck stiffness   Skin: Skin is warm and dry. No rash noted. She is not diaphoretic.  Psychiatric: She has a normal mood and affect.  Nursing note and vitals reviewed.         Assessment & Plan:

## 2014-07-11 ENCOUNTER — Encounter: Payer: Self-pay | Admitting: *Deleted

## 2014-07-12 ENCOUNTER — Other Ambulatory Visit (HOSPITAL_COMMUNITY): Payer: Self-pay | Admitting: Psychiatry

## 2014-07-12 ENCOUNTER — Telehealth (HOSPITAL_COMMUNITY): Payer: Self-pay

## 2014-07-12 NOTE — Telephone Encounter (Signed)
I tried to return phone call but the voicemail is full and cannot leave a message.

## 2014-07-13 ENCOUNTER — Telehealth (HOSPITAL_COMMUNITY): Payer: Self-pay | Admitting: Psychiatry

## 2014-07-13 MED ORDER — HYDROXYZINE PAMOATE 25 MG PO CAPS: 25.0000 mg | ORAL_CAPSULE | Freq: Every day | ORAL | Status: DC | PRN

## 2014-07-13 NOTE — Telephone Encounter (Signed)
I returned patient's phone call.  She is complaining of increased anxiety and psychosocial stressors.  She cannot use transportation because it is under investigation for fraud.  Her daughter is in the hospital.  She is no longer taking Abilify because her primary care physician stopped Abilify due to weight gain.  She is requesting to increase clorazepate however I feel that she should see a therapist for her psychosocial stressors.  I suggested not to increase clorazepate however I will give Vistaril 25 mg as needed for anxiety.  Encouraged to keep appointment and we will discuss more on her next appointment.  Patient agreed.

## 2014-07-13 NOTE — Telephone Encounter (Signed)
Telephone call with patient to inform of need to continue to take clorazepate only one a day as directed.  Informed Dr. Adele Schilder wanted patient to reconsider coming back in for therapy as a way to deal with periodic panic attacks and not self medicating with more medication than prescribed.  Patient informed nurse she had been going through a lot, her daughter had been in the hospital, she lost her Social Security check, bills were backing up and she lost the company that use to help her with transportation in for therapy appointments as they were closed down for fraud investigation.  Patient stated she could not currently afford to come in for therapy and was also upset her appointment for 08/03/14 with Dr. Adele Schilder was moved back to 08/29/14 due to provider being out of town.  Patient stated she wanted to be able to come in to discuss problems with provider "but now I can't see him until August".  Patient stated concern she is going to run out of clorazepate at least 6 days early as reports on approximately 6 days she took 2 a day.  Patient requests since she cannot see MD until August she would like to discuss medications further with Dr. Adele Schilder as she reports she "needs something" to help during days with increased anxiety.

## 2014-07-17 ENCOUNTER — Emergency Department (HOSPITAL_COMMUNITY): Payer: Medicare Other

## 2014-07-17 ENCOUNTER — Encounter (HOSPITAL_COMMUNITY): Payer: Self-pay | Admitting: Emergency Medicine

## 2014-07-17 ENCOUNTER — Emergency Department (HOSPITAL_COMMUNITY)
Admission: EM | Admit: 2014-07-17 | Discharge: 2014-07-17 | Disposition: A | Discharge status: Home / Self Care | Payer: Medicare Other | Attending: Emergency Medicine | Admitting: Emergency Medicine

## 2014-07-17 DIAGNOSIS — F418 Other specified anxiety disorders: Secondary | ICD-10-CM | POA: Diagnosis not present

## 2014-07-17 DIAGNOSIS — Z86010 Personal history of colonic polyps: Secondary | ICD-10-CM | POA: Insufficient documentation

## 2014-07-17 DIAGNOSIS — K5900 Constipation, unspecified: Secondary | ICD-10-CM | POA: Insufficient documentation

## 2014-07-17 DIAGNOSIS — R109 Unspecified abdominal pain: Secondary | ICD-10-CM

## 2014-07-17 DIAGNOSIS — Z8709 Personal history of other diseases of the respiratory system: Secondary | ICD-10-CM | POA: Insufficient documentation

## 2014-07-17 DIAGNOSIS — N189 Chronic kidney disease, unspecified: Secondary | ICD-10-CM | POA: Diagnosis not present

## 2014-07-17 DIAGNOSIS — Z720 Tobacco use: Secondary | ICD-10-CM | POA: Insufficient documentation

## 2014-07-17 DIAGNOSIS — Z3202 Encounter for pregnancy test, result negative: Secondary | ICD-10-CM | POA: Diagnosis not present

## 2014-07-17 DIAGNOSIS — Z872 Personal history of diseases of the skin and subcutaneous tissue: Secondary | ICD-10-CM | POA: Insufficient documentation

## 2014-07-17 DIAGNOSIS — Z862 Personal history of diseases of the blood and blood-forming organs and certain disorders involving the immune mechanism: Secondary | ICD-10-CM | POA: Insufficient documentation

## 2014-07-17 DIAGNOSIS — G43909 Migraine, unspecified, not intractable, without status migrainosus: Secondary | ICD-10-CM | POA: Insufficient documentation

## 2014-07-17 DIAGNOSIS — M549 Dorsalgia, unspecified: Secondary | ICD-10-CM | POA: Diagnosis not present

## 2014-07-17 DIAGNOSIS — Z79899 Other long term (current) drug therapy: Secondary | ICD-10-CM | POA: Insufficient documentation

## 2014-07-17 DIAGNOSIS — I129 Hypertensive chronic kidney disease with stage 1 through stage 4 chronic kidney disease, or unspecified chronic kidney disease: Secondary | ICD-10-CM | POA: Diagnosis not present

## 2014-07-17 DIAGNOSIS — G709 Myoneural disorder, unspecified: Secondary | ICD-10-CM | POA: Insufficient documentation

## 2014-07-17 LAB — I-STAT BETA HCG BLOOD, ED (MC, WL, AP ONLY): I-stat hCG, quantitative: <5 m[IU]/mL (ref <5)

## 2014-07-17 LAB — CBC WITH DIFFERENTIAL/PLATELET
Basophils Absolute: 0 10*3/uL (ref 0.0–0.1)
Basophils Relative: 0 % (ref 0–1)
Eosinophils Absolute: 0.1 10*3/uL (ref 0.0–0.7)
Eosinophils Relative: 2 % (ref 0–5)
HEMATOCRIT: 39.1 % (ref 36.0–46.0)
Hemoglobin: 12.8 g/dL (ref 12.0–15.0)
Lymphocytes Relative: 42 % (ref 12–46)
Lymphs Abs: 2.7 10*3/uL (ref 0.7–4.0)
MCH: 27.6 pg (ref 26.0–34.0)
MCHC: 32.7 g/dL (ref 30.0–36.0)
MCV: 84.3 fL (ref 78.0–100.0)
MONOS PCT: 10 % (ref 3–12)
Monocytes Absolute: 0.7 10*3/uL (ref 0.1–1.0)
NEUTROS PCT: 46 % (ref 43–77)
Neutro Abs: 3 10*3/uL (ref 1.7–7.7)
Platelets: 321 10*3/uL (ref 150–400)
RBC: 4.64 MIL/uL (ref 3.87–5.11)
RDW: 14.8 % (ref 11.5–15.5)
WBC: 6.4 10*3/uL (ref 4.0–10.5)

## 2014-07-17 LAB — URINALYSIS, ROUTINE W REFLEX MICROSCOPIC
Bilirubin Urine: NEGATIVE
GLUCOSE, UA: NEGATIVE mg/dL
Hgb urine dipstick: NEGATIVE
Ketones, ur: NEGATIVE mg/dL
Leukocytes, UA: NEGATIVE
NITRITE: NEGATIVE
PROTEIN: NEGATIVE mg/dL
SPECIFIC GRAVITY, URINE: 1.016 (ref 1.005–1.030)
Urobilinogen, UA: 0.2 mg/dL (ref 0.0–1.0)
pH: 7 (ref 5.0–8.0)

## 2014-07-17 LAB — COMPREHENSIVE METABOLIC PANEL
ALBUMIN: 3.3 g/dL — AB (ref 3.5–5.0)
ALK PHOS: 64 U/L (ref 38–126)
ALT: 14 U/L (ref 14–54)
AST: 16 U/L (ref 15–41)
Anion gap: 4 — ABNORMAL LOW (ref 5–15)
BUN: 5 mg/dL — ABNORMAL LOW (ref 6–20)
CALCIUM: 8.5 mg/dL — AB (ref 8.9–10.3)
CHLORIDE: 108 mmol/L (ref 101–111)
CO2: 25 mmol/L (ref 22–32)
Creatinine, Ser: 0.86 mg/dL (ref 0.44–1.00)
GFR calc Af Amer: >60 mL/min (ref >60)
GFR calc non Af Amer: >60 mL/min (ref >60)
GLUCOSE: 97 mg/dL (ref 65–99)
Potassium: 4.2 mmol/L (ref 3.5–5.1)
Sodium: 137 mmol/L (ref 135–145)
Total Bilirubin: 0.5 mg/dL (ref 0.3–1.2)
Total Protein: 6.5 g/dL (ref 6.5–8.1)

## 2014-07-17 LAB — URINE MICROSCOPIC-ADD ON

## 2014-07-17 IMAGING — CT CT ABD-PELV W/ CM
2 of 5 series · 17 of 46 positions shown, 19 images · IV contrast (Omni 300)
Comparison: [DATE].

CLINICAL DATA: Left abdominal pain and nausea for the past 4 days.
Back and flank pain. Constipation.

EXAM:
CT ABDOMEN AND PELVIS WITH CONTRAST
TECHNIQUE: Multidetector CT imaging of the abdomen and pelvis was performed
using the standard protocol following bolus administration of
intravenous contrast.
CONTRAST:  100mL OMNIPAQUE IOHEXOL 300 MG/ML  SOLN

[Series 2: abd/ pelvis 5.0 i30f 1 · axial · 0.83mm/px · z∈[+1039,+1424]mm · 14 of 87 slices shown, 16 images]
[im 5/87  soft-tissue]
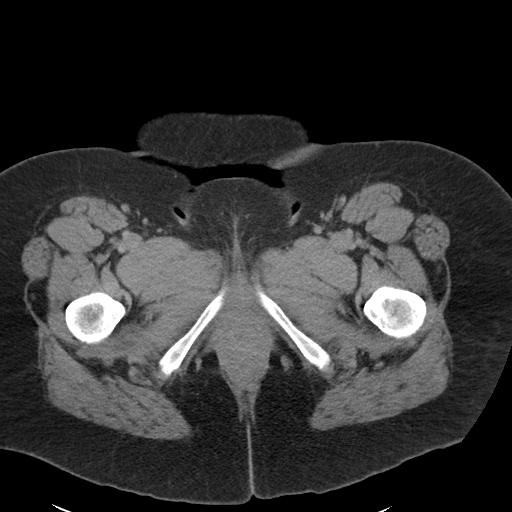
[im 5/87  bone]
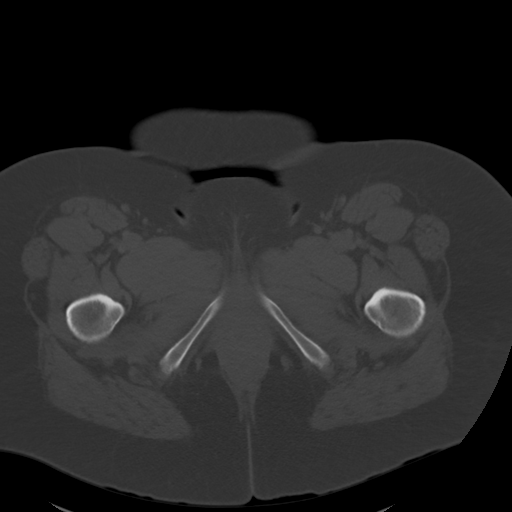
[im 13/87  soft-tissue]
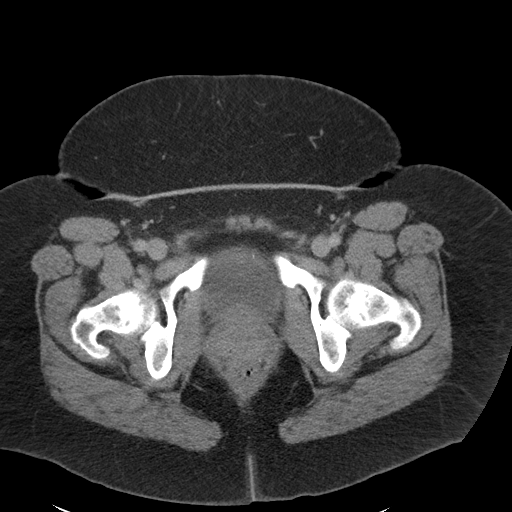
[im 18/87  soft-tissue]
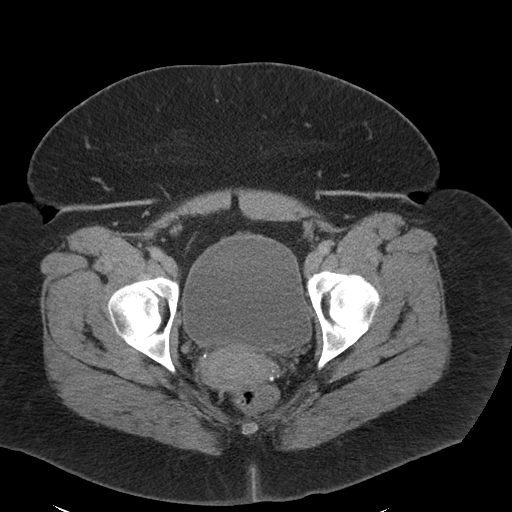
[im 22/87  soft-tissue]
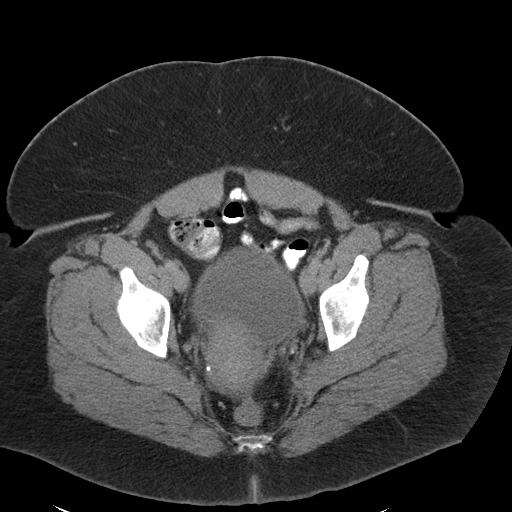
[im 31/87  soft-tissue]
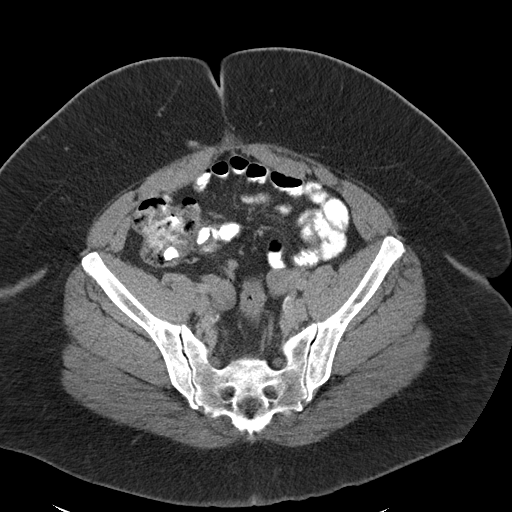
[im 35/87  soft-tissue]
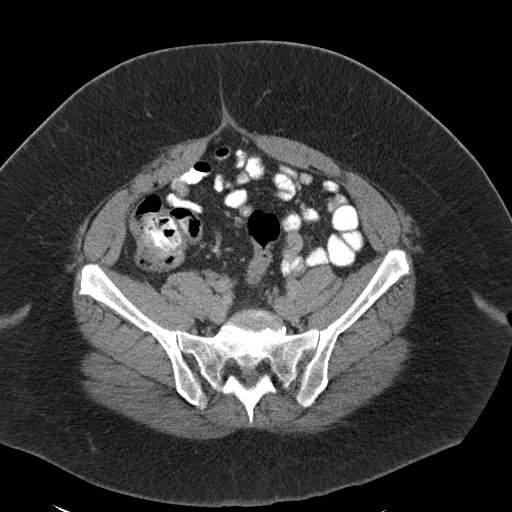
[im 39/87  soft-tissue]
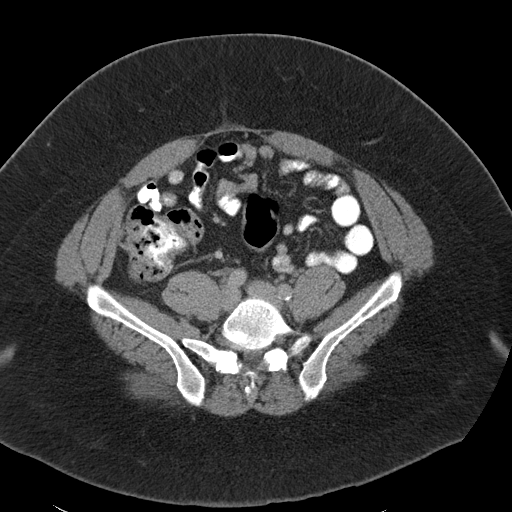
[im 48/87  soft-tissue]
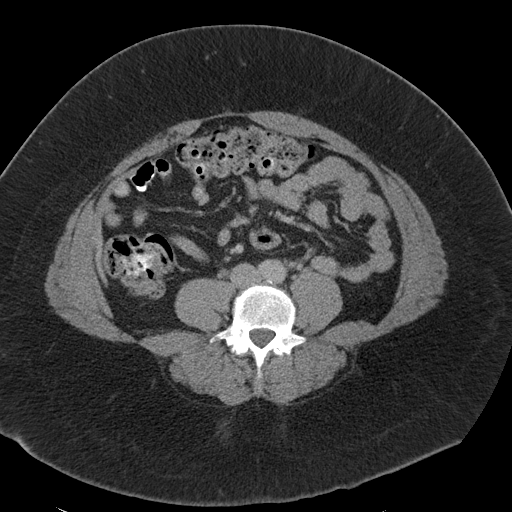
[im 52/87  soft-tissue]
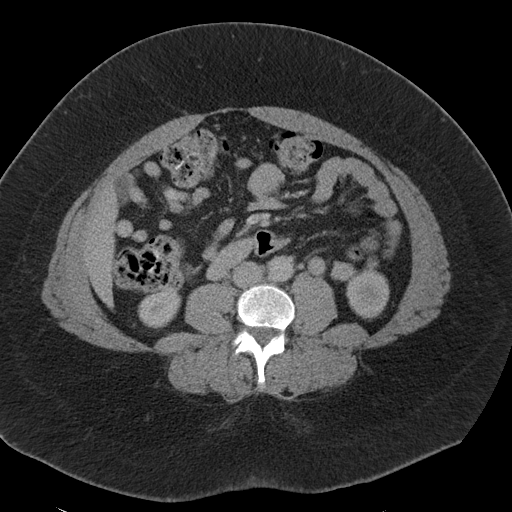
[im 52/87  bone]
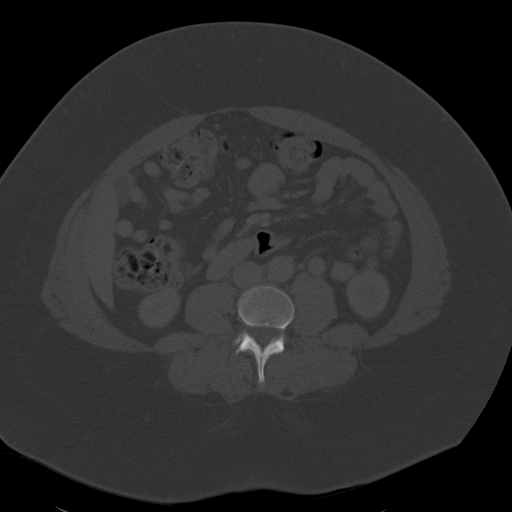
[im 56/87  soft-tissue]
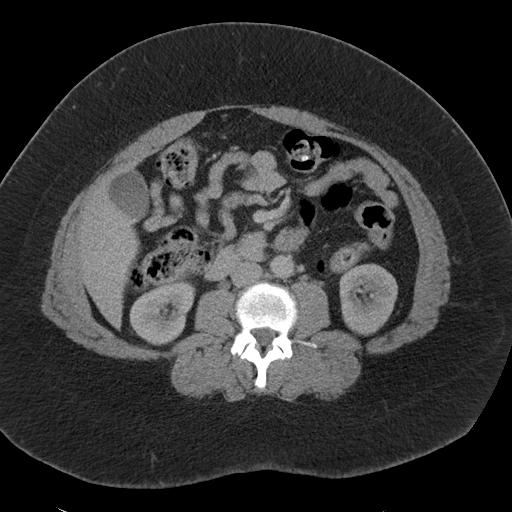
[im 65/87  soft-tissue]
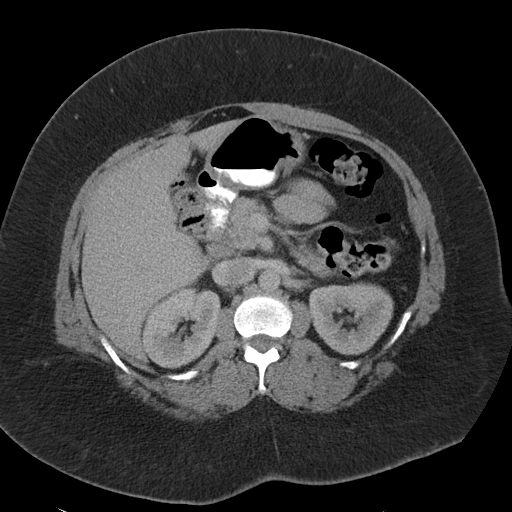
[im 69/87  soft-tissue]
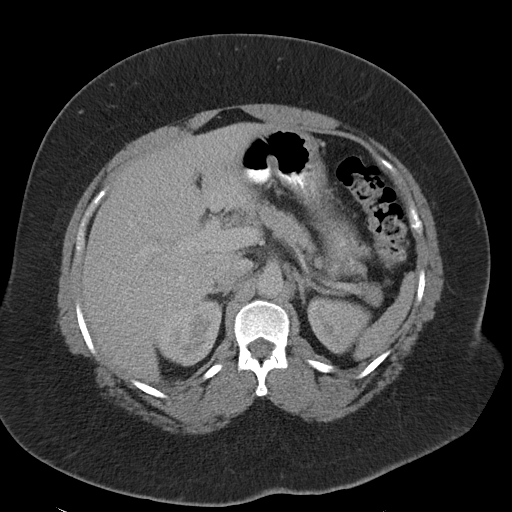
[im 74/87  soft-tissue]
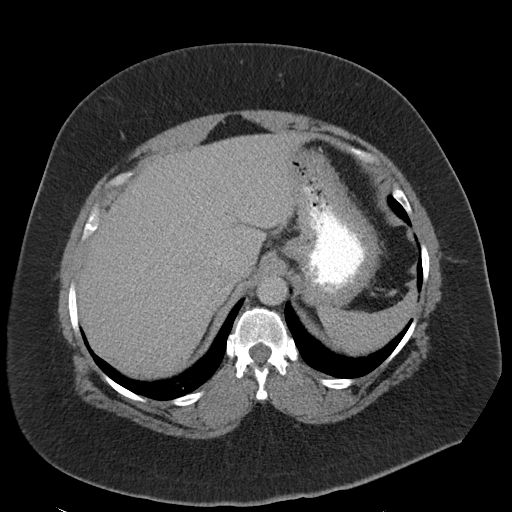
[im 82/87  soft-tissue]
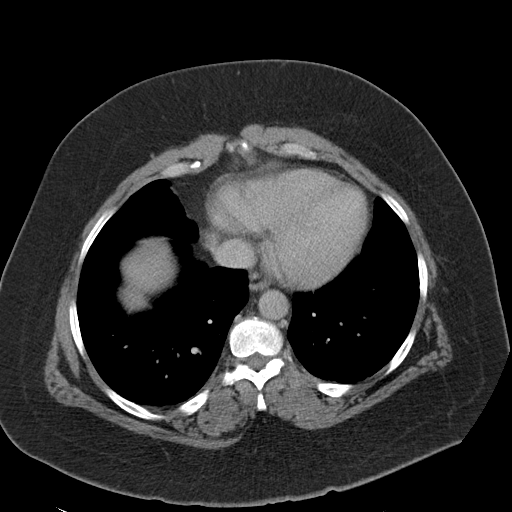

[Series 5: coronals · coronal · 0.75mm/px · 3 of 188 slices shown]
[im 63/188  soft-tissue]
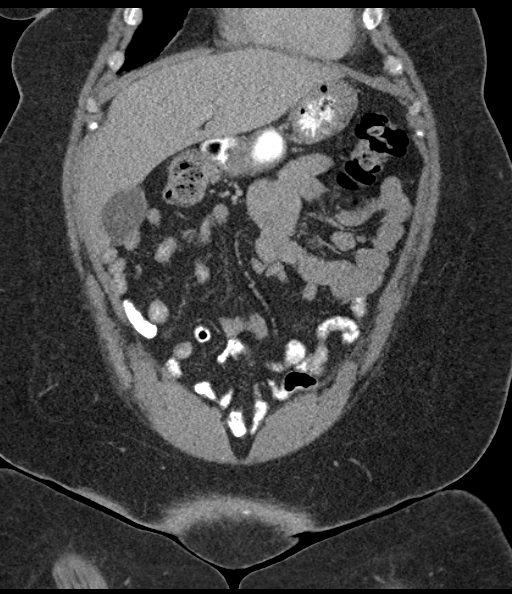
[im 84/188  soft-tissue]
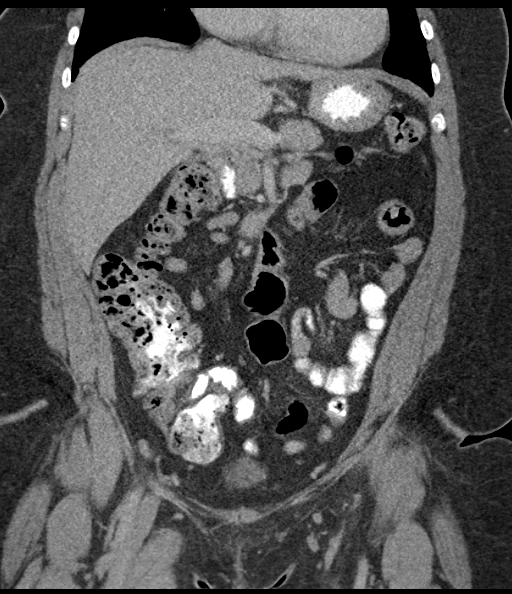
[im 104/188  soft-tissue]
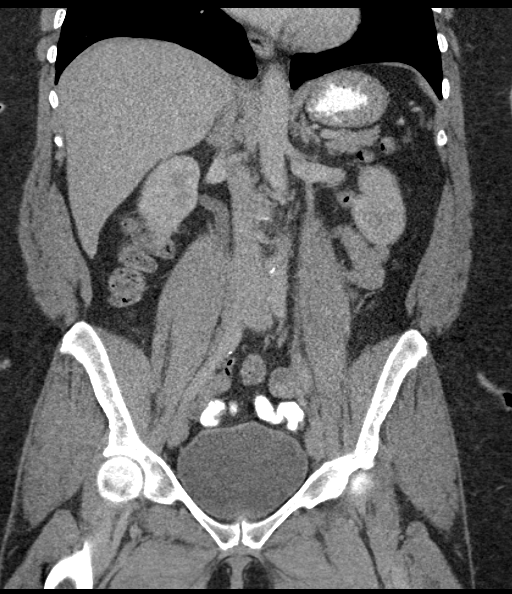

[17 of 46 positions shown; findings below may reference images not displayed]

FINDINGS: Normal appearing liver, spleen, pancreas, gallbladder, adrenal
glands, kidneys, ureters, urinary bladder, uterus and ovaries. No
urinary tract calculi or hydronephrosis. No gastrointestinal
abnormalities or enlarged lymph nodes. Mildly prominent stool.
Normal appearing appendix. Stable mild linear scarring at the left
lung base. Mild lumbar and lower thoracic spine degenerative changes
and mild scoliosis.
IMPRESSION: No acute abnormality.  Mildly prominent stool.

## 2014-07-17 MED ORDER — HYDROMORPHONE HCL 1 MG/ML IJ SOLN
1.0000 mg | Freq: Once | INTRAMUSCULAR | Status: AC
  Administered 2014-07-17: 1 mg via INTRAVENOUS
  Filled 2014-07-17: qty 1

## 2014-07-17 MED ORDER — SODIUM CHLORIDE 0.9 % IV BOLUS (SEPSIS)
1000.0000 mL | Freq: Once | INTRAVENOUS | Status: AC
  Administered 2014-07-17: 1000 mL via INTRAVENOUS

## 2014-07-17 MED ORDER — IOHEXOL 300 MG/ML  SOLN
100.0000 mL | Freq: Once | INTRAMUSCULAR | Status: AC | PRN
  Administered 2014-07-17: 100 mL via INTRAVENOUS

## 2014-07-17 MED ORDER — DIAZEPAM 5 MG PO TABS: 5.0000 mg | ORAL_TABLET | Freq: Three times a day (TID) | ORAL | Status: DC | PRN

## 2014-07-17 MED ORDER — DOCUSATE SODIUM 100 MG PO CAPS: 100.0000 mg | ORAL_CAPSULE | Freq: Two times a day (BID) | ORAL | Status: DC | PRN

## 2014-07-17 MED ORDER — DIAZEPAM 5 MG PO TABS
5.0000 mg | ORAL_TABLET | Freq: Once | ORAL | Status: AC
  Administered 2014-07-17: 5 mg via ORAL
  Filled 2014-07-17: qty 1

## 2014-07-17 MED ORDER — ONDANSETRON HCL 4 MG/2ML IJ SOLN
4.0000 mg | Freq: Once | INTRAMUSCULAR | Status: AC
  Administered 2014-07-17: 4 mg via INTRAVENOUS
  Filled 2014-07-17: qty 2

## 2014-07-17 MED ORDER — IOHEXOL 300 MG/ML  SOLN
25.0000 mL | Freq: Once | INTRAMUSCULAR | Status: AC | PRN
  Administered 2014-07-17: 25 mL via ORAL

## 2014-07-17 NOTE — Discharge Instructions (Signed)
Read the information below.  Use the prescribed medication as directed.  Please discuss all new medications with your pharmacist.  You may return to the Emergency Department at any time for worsening condition or any new symptoms that concern you.   If you develop high fevers, worsening abdominal/flank pain, uncontrolled vomiting, or are unable to tolerate fluids by mouth, return to the ER for a recheck.   °

## 2014-07-17 NOTE — ED Notes (Signed)
Pt c/o left low back pain that radiates around to left flank area. Pt denies urinary symptoms or recent injury.

## 2014-07-17 NOTE — ED Provider Notes (Signed)
CSN: [id number]     Arrival date & time 07/17/14  1194 History   First MD Initiated Contact with Patient 07/17/14 0840     Chief Complaint  Patient presents with  . Back Pain  . Flank Pain     (Consider location/radiation/quality/duration/timing/severity/associated sxs/prior Treatment) The history is provided by the patient.     Pt p/w left sided back and flank pain that has been sharp/aching, nonradiating, constant x 4 days.  Mild constipation.  No associated symptoms.  Has taken percocet and 800mg  ibuprofen without relief.  Denies exacerbating or palliative symptoms.  Denies fevers, chills, loss of control of bowel or bladder, weakness of numbness of the extremities, saddle anesthesia, urinary, or vaginal complaints.   Denies diarrhea or bloody stool.   Past Medical History  Diagnosis Date  . Depression with anxiety   . Hypertension   . Anorexia   . IBS (irritable bowel syndrome)   . Low back pain   . Edema leg   . Hemorrhoids   . Panic attacks   . Anemia     Iron Def  . Colon polyp 2009  . Migraines   . Colon polyps   . Neuromuscular disorder     fibromyalgia  . Tonsil pain   . Polyp of vocal cord or larynx   . Hidradenitis suppurativa   . Depression   . Chronic kidney disease     Nephrotic syndrome   Past Surgical History  Procedure Laterality Date  . Hemorrhoid surgery      with Hidradenitis surgery   . Upper gastrointestinal endoscopy    . Axillary hidradenitis excision    . Inguinal hidradenitis excision    . Tonsillectomy  10/18/2010    by Dr. Wilburn Cornelia   Family History  Problem Relation Age of Onset  . Colon cancer Neg Hx   . Diabetes Mother   . Heart disease Mother     valve leak  . Anxiety disorder Mother   . Depression Mother   . Cancer Father     prostate  . Heart disease Father   . Learning disabilities Sister   . Depression Sister   . Depression Sister   . Anxiety disorder Sister    History  Substance Use Topics  . Smoking status:  Current Some Day Smoker -- 0.50 packs/day for 25 years    Types: Cigarettes  . Smokeless tobacco: Never Used     Comment: 10 cigarette a day  . Alcohol Use: No     Comment: Occasional   OB History    Gravida Para Term Preterm AB TAB SAB Ectopic Multiple Living   2 1   1 1          Review of Systems  All other systems reviewed and are negative.     Allergies  Review of patient's allergies indicates no known allergies.  Home Medications   Prior to Admission medications   Medication Sig Start Date End Date Taking? Authorizing Provider  ARIPiprazole (ABILIFY) 5 MG tablet Take 1 tablet (5 mg total) by mouth daily. Patient not taking: Reported on 07/02/2014 05/04/14   Kathlee Nations, MD  buPROPion (WELLBUTRIN XL) 300 MG 24 hr tablet TAKE 1 TABLET (300 MG TOTAL) BY MOUTH EVERY MORNING. 05/04/14   Kathlee Nations, MD  clorazepate (TRANXENE) 15 MG tablet Take 1 tablet (15 mg total) by mouth every morning. 05/04/14   Kathlee Nations, MD  gabapentin (NEURONTIN) 400 MG capsule Take 1 capsule (400 mg total)  by mouth daily. 05/04/14   Kathlee Nations, MD  hydrOXYzine (VISTARIL) 25 MG capsule Take 1 capsule (25 mg total) by mouth daily as needed for anxiety. 07/13/14   Kathlee Nations, MD  omeprazole (PRILOSEC) 40 MG capsule Take 40 mg by mouth daily.    Historical Provider, MD  oxyCODONE-acetaminophen (PERCOCET/ROXICET) 5-325 MG per tablet Take 1 tablet by mouth every 4 (four) hours as needed for severe pain. 07/02/14   Pattricia Boss, MD  promethazine (PHENERGAN) 25 MG tablet TAKE 1 TABLET (25 MG TOTAL) BY MOUTH EVERY 6 (SIX) HOURS AS NEEDED FOR NAUSEA. 05/30/14   Donnamae Jude, MD  topiramate (TOPAMAX) 25 MG tablet Take 1 tablet (25 mg total) by mouth daily. Patient not taking: Reported on 07/02/2014 05/11/14   Lucious Groves, DO   BP 145/86 mmHg  Pulse 101  Temp(Src) 98.4 F (36.9 C)  Resp 20  Ht 5\' 11"  (1.803 m)  Wt 319 lb 3.6 oz (144.8 kg)  BMI 44.54 kg/m2  SpO2 98%  LMP 05/11/2014 Physical Exam    Constitutional: She appears well-developed and well-nourished. No distress.  HENT:  Head: Normocephalic and atraumatic.  Neck: Neck supple.  Cardiovascular: Normal rate and regular rhythm.   Pulmonary/Chest: Effort normal and breath sounds normal. No respiratory distress. She has no wheezes. She has no rales.  Abdominal: Soft. She exhibits no distension and no mass. There is tenderness. There is no rebound and no guarding.    Neurological: She is alert.  Skin: She is not diaphoretic.  Nursing note and vitals reviewed.   ED Course  Procedures (including critical care time) Labs Review Labs Reviewed  COMPREHENSIVE METABOLIC PANEL - Abnormal; Notable for the following:    BUN 5 (*)    Calcium 8.5 (*)    Albumin 3.3 (*)    Anion gap 4 (*)    All other components within normal limits  URINALYSIS, ROUTINE W REFLEX MICROSCOPIC (NOT AT [facility]) - Abnormal; Notable for the following:    APPearance TURBID (*)    All other components within normal limits  URINE MICROSCOPIC-ADD ON - Abnormal; Notable for the following:    Squamous Epithelial / LPF MANY (*)    Bacteria, UA MANY (*)    All other components within normal limits  CBC WITH DIFFERENTIAL/PLATELET  I-STAT BETA HCG BLOOD, ED ([facility], [facility], [facility] ONLY)    Imaging Review Ct Abdomen Pelvis W Contrast  07/17/2014   CLINICAL DATA:  Left abdominal pain and nausea for the past 4 days. Back and flank pain. Constipation.  EXAM: CT ABDOMEN AND PELVIS WITH CONTRAST  TECHNIQUE: Multidetector CT imaging of the abdomen and pelvis was performed using the standard protocol following bolus administration of intravenous contrast.  CONTRAST:  168mL OMNIPAQUE IOHEXOL 300 MG/ML  SOLN  COMPARISON:  09/02/2010.  FINDINGS: Normal appearing liver, spleen, pancreas, gallbladder, adrenal glands, kidneys, ureters, urinary bladder, uterus and ovaries. No urinary tract calculi or hydronephrosis. No gastrointestinal abnormalities or enlarged lymph nodes. Mildly prominent  stool. Normal appearing appendix. Stable mild linear scarring at the left lung base. Mild lumbar and lower thoracic spine degenerative changes and mild scoliosis.  IMPRESSION: No acute abnormality.  Mildly prominent stool.   Electronically Signed   By: Claudie Revering M.D.   On: 07/17/2014 11:19     EKG Interpretation None      MDM   Final diagnoses:  Left flank pain  Constipation, unspecified constipation type    Afebrile, nontoxic patient with left flank pain  x 4 days without associated symptoms.  Labs, UA unremarkable.  CT abd/pelvis without acute abnormality, there is a significant stool burden.  IVF, pain/nausea medication given.  After workup, pt notes the pain did start after she suddenly got up from bed to answer the door - possibility this is muscular, also possibly constipation.    D/C home with laxative, valium to serve as muscle relaxer.  I did not prescribe pt narcotics as this has potential to worsen his constipation.  PCP follow up.   Discussed result, findings, treatment, and follow up  with patient.  Pt given return precautions.  Pt verbalizes understanding and agrees with plan.         Clayton Bibles, PA-C 07/17/14 [place], MD 07/17/14 [phone]

## 2014-07-22 ENCOUNTER — Telehealth: Payer: Self-pay | Admitting: *Deleted

## 2014-07-22 NOTE — Telephone Encounter (Signed)
CALLED PATIENT LEFT VOICE MESSAGE / PATIENT WILL NEED TO CALL THE HEADACHE OFFICE TO LET THEM KNOW WHEN SHE CAN BE SEEN. PATIENT CAN CALL [facility] FOR THE PHONE NUMBER IF NEED BE.

## 2014-07-25 ENCOUNTER — Ambulatory Visit (INDEPENDENT_AMBULATORY_CARE_PROVIDER_SITE_OTHER): Payer: Medicare Other

## 2014-07-25 ENCOUNTER — Encounter: Payer: Self-pay | Admitting: Podiatry

## 2014-07-25 ENCOUNTER — Telehealth: Payer: Self-pay | Admitting: *Deleted

## 2014-07-25 ENCOUNTER — Ambulatory Visit (INDEPENDENT_AMBULATORY_CARE_PROVIDER_SITE_OTHER): Payer: Medicare Other | Admitting: Podiatry

## 2014-07-25 VITALS — BP 128/78 | HR 86 | Resp 12

## 2014-07-25 DIAGNOSIS — M19079 Primary osteoarthritis, unspecified ankle and foot: Secondary | ICD-10-CM | POA: Diagnosis not present

## 2014-07-25 DIAGNOSIS — M2141 Flat foot [pes planus] (acquired), right foot: Secondary | ICD-10-CM | POA: Diagnosis not present

## 2014-07-25 DIAGNOSIS — B351 Tinea unguium: Secondary | ICD-10-CM

## 2014-07-25 DIAGNOSIS — L603 Nail dystrophy: Secondary | ICD-10-CM

## 2014-07-25 DIAGNOSIS — S92912A Unspecified fracture of left toe(s), initial encounter for closed fracture: Secondary | ICD-10-CM

## 2014-07-25 DIAGNOSIS — R52 Pain, unspecified: Secondary | ICD-10-CM

## 2014-07-25 DIAGNOSIS — M2142 Flat foot [pes planus] (acquired), left foot: Secondary | ICD-10-CM | POA: Diagnosis not present

## 2014-07-25 NOTE — Progress Notes (Signed)
Subjective:    Patient ID: Lindsay [name], female    DOB: December 11, 1970, 44 y.o.   MRN: [id number]  HPI 44 year old female presented also with multiple complaints. For she initially made the appointment for painful callus on the right foot which is been ongoing for about 4-6 months and she has pain to the area particular with pressure in shoe gear. She denies any drainage or redness from around the area. She said no prior treatment for this. She also states that over the last couple weeks she has started to notice increased pain to her ankles. She has matured or pain with ambulation or prolonged weightbearing. She denies any swelling or redness. Denies any tingling or numbness. She's had no prior treatment for this either. She has a states that she's had some pain to her left fourth and fifth toes after she stopped her toes approximate one week ago. She has pain particular treatment then teratoma pressure to the toes. She states this been some mild swelling to the area. No treatment for this. Lastly she also states that her nails are thick and discolored which cause pain particular shoe gear and pressure. Denies any redness or drainage on nail sites. No other complaints at this time.    Review of Systems  Constitutional: Positive for activity change.  Gastrointestinal: Positive for nausea.  Musculoskeletal: Positive for back pain and joint swelling.  All other systems reviewed and are negative.      Objective:   Physical Exam AAO x3, NAD, obese DP/PT pulses palpable bilaterally, CRT less than 3 seconds Protective sensation intact with Simms Weinstein monofilament, vibratory sensation intact, Achilles tendon reflex intact There is a decrease in medial arch height upon weightbearing bilaterally. There is a decrease in range of motion of the subtalar joint on the right side with the left and there is mild crepitation with range of motion on the right side. Joint range of motion is intact bilaterally  and pain-free. There is mild discomfort along midtarsal joint range of motion on the right side. MTPJ range of motion is intact bilaterally.  There is tenderness to palpation overlying the left fourth and fifth digits bilaterally with mild edema overlying the area. There is no overlying edema, erythema, increase in warmth bilaterally except for otherwise stated. MMT 5/5, ROM WNL except for otherwise stated. On the medial aspect of the right foot there is a new hyperkeratotic lesion with tenderness overlying the area. Upon debridement no underlying ulceration, drainage or any clinical signs of infection. No open lesions or other pre-ulcerative lesions.  Nails are hypertrophic, dystrophic, brittle, elongated, discolored 10. No swelling erythema or drainage on nail sites. No overlying edema, erythema, increase in warmth to bilateral lower extremities.  No pain with calf compression, swelling, warmth, erythema bilaterally.       Assessment & Plan:  44 year old female with bilateral symptomatic flatfoot with arthritic changes; symptomatically hyperkeratotic lesion; onychodystrophy, likely onychomycosis; possible fracture left 4th/5th toes.  -X-rays were obtained and reviewed with the patient.  -Treatment options discussed including all alternatives, risks, and complications -In regards the hyperkeratotic lesion was sharply debrided without consultation/bleeding. Upon was applied around the area followed by salinocaine and a bandage. Post procedure care was discussed the patient for which she verbally understood. Monitoring signs or symptoms of infection and directed to call the office immediately should any occur or go to the ER. -For the likely fracture to the fourth and fifth toes of left foot will place and surgical shoe which was dispensed  to her today. Ice and elevation. -Discussed likely etiology for foot pain. There is arthritic changes present more on the right and the left due to her with a  flatfoot. She would likely benefit from brace/inserts. Rx given for [employer] for brace/inserts.  -Nails are biopsied and sent to [facility] labs for evaluation of onychomycosis. Discussed her treatment options however we will await the results of the biopsy before proceeding with treatment. -Follow-up in 4 weeks or sooner should any problems arise or any change in symptoms. Call any questions, concerns in the meantime. Follow-up with PCP for other issues mentioned in the review of systems.  Celesta Gentile, DPM

## 2014-07-26 NOTE — Telephone Encounter (Signed)
Pt's insurance is Medicaid and toenail fragments are sent to [facility].

## 2014-07-29 ENCOUNTER — Telehealth: Payer: Self-pay | Admitting: *Deleted

## 2014-07-29 NOTE — Telephone Encounter (Signed)
[facility] called to speak with you about test results for pt.  She said she spoke with you yesterday, return number is  [phone], option 3. Reference pt. Name Lindsay [name] DOB 1970-03-11

## 2014-08-03 ENCOUNTER — Ambulatory Visit (HOSPITAL_COMMUNITY): Payer: Self-pay | Admitting: Psychiatry

## 2014-08-05 ENCOUNTER — Other Ambulatory Visit (HOSPITAL_COMMUNITY): Payer: Self-pay | Admitting: Psychiatry

## 2014-08-05 ENCOUNTER — Other Ambulatory Visit: Payer: Self-pay | Admitting: Family Medicine

## 2014-08-05 DIAGNOSIS — F331 Major depressive disorder, recurrent, moderate: Secondary | ICD-10-CM

## 2014-08-05 NOTE — Telephone Encounter (Signed)
Met with Dr. Salem Senate who approved one time refill orders of patients Tranxene and Wellbutrin XL.  Patient's Wellbutrin XL e-scribed into patient's [facility] but Tranxene was printed.  Nurse went to call in Tranxene order and learned from patient's pharmacy they had gotten an order for Valium 73m, one every 8 hours, #10 from another provider.  Discussed with CDarlyne Russian PA-C voided prescription as will let Dr. AAdele Schilderdecide if patient should still be taking at this time.

## 2014-08-06 ENCOUNTER — Other Ambulatory Visit (HOSPITAL_COMMUNITY): Payer: Self-pay | Admitting: Psychiatry

## 2014-08-06 DIAGNOSIS — F331 Major depressive disorder, recurrent, moderate: Secondary | ICD-10-CM

## 2014-08-09 NOTE — Telephone Encounter (Signed)
Telephone call with patient after she left a message requesting a refill of her Tranxene.  Patient denied any misuse of Valium or other benzodiazepines, stating she was given 10 pills of Valium at an ED visit 07/17/14 for problems related to constipation pain and did not take them.  Patient stated "I only take what you all prescribed for me" and stated she was taking medications daily as Dr. Adele Schilder has written.  Discussed with Dr. Salem Senate who agreed to have a one time refill of patient's requested Tranxene called in for patient this date with reminder patient would need to keep appointment set with Dr. Adele Schilder on 08/29/14 for any further refills.  Patient agreed with plan and called in new one time Tranxene order to [facility] on [address] with Barnabas Lister, Software engineer.

## 2014-08-11 DIAGNOSIS — G43719 Chronic migraine without aura, intractable, without status migrainosus: Secondary | ICD-10-CM | POA: Diagnosis not present

## 2014-08-11 DIAGNOSIS — Z049 Encounter for examination and observation for unspecified reason: Secondary | ICD-10-CM | POA: Diagnosis not present

## 2014-08-12 DIAGNOSIS — M791 Myalgia: Secondary | ICD-10-CM | POA: Diagnosis not present

## 2014-08-12 DIAGNOSIS — R51 Headache: Secondary | ICD-10-CM | POA: Diagnosis not present

## 2014-08-12 DIAGNOSIS — G43719 Chronic migraine without aura, intractable, without status migrainosus: Secondary | ICD-10-CM | POA: Diagnosis not present

## 2014-08-12 DIAGNOSIS — M542 Cervicalgia: Secondary | ICD-10-CM | POA: Diagnosis not present

## 2014-08-12 DIAGNOSIS — G518 Other disorders of facial nerve: Secondary | ICD-10-CM | POA: Diagnosis not present

## 2014-08-15 ENCOUNTER — Institutional Professional Consult (permissible substitution): Payer: Self-pay | Admitting: Internal Medicine

## 2014-08-26 ENCOUNTER — Ambulatory Visit (INDEPENDENT_AMBULATORY_CARE_PROVIDER_SITE_OTHER): Payer: Medicare Other

## 2014-08-26 ENCOUNTER — Encounter: Payer: Self-pay | Admitting: Podiatry

## 2014-08-26 ENCOUNTER — Ambulatory Visit (INDEPENDENT_AMBULATORY_CARE_PROVIDER_SITE_OTHER): Payer: Medicare Other | Admitting: Podiatry

## 2014-08-26 VITALS — BP 119/75 | HR 89 | Resp 15

## 2014-08-26 DIAGNOSIS — M6789 Other specified disorders of synovium and tendon, multiple sites: Secondary | ICD-10-CM

## 2014-08-26 DIAGNOSIS — S92912A Unspecified fracture of left toe(s), initial encounter for closed fracture: Secondary | ICD-10-CM | POA: Diagnosis not present

## 2014-08-26 DIAGNOSIS — R52 Pain, unspecified: Secondary | ICD-10-CM | POA: Diagnosis not present

## 2014-08-26 DIAGNOSIS — M76829 Posterior tibial tendinitis, unspecified leg: Secondary | ICD-10-CM

## 2014-08-26 DIAGNOSIS — M19079 Primary osteoarthritis, unspecified ankle and foot: Secondary | ICD-10-CM | POA: Diagnosis not present

## 2014-08-26 DIAGNOSIS — M2141 Flat foot [pes planus] (acquired), right foot: Secondary | ICD-10-CM

## 2014-08-26 DIAGNOSIS — L603 Nail dystrophy: Secondary | ICD-10-CM

## 2014-08-26 DIAGNOSIS — M2142 Flat foot [pes planus] (acquired), left foot: Secondary | ICD-10-CM | POA: Diagnosis not present

## 2014-08-26 MED ORDER — TRAMADOL HCL 50 MG PO TABS: 50.0000 mg | ORAL_TABLET | Freq: Three times a day (TID) | ORAL | Status: DC | PRN

## 2014-08-26 NOTE — Patient Instructions (Signed)
Over the counter treatment for nail fungus is fungi-nail

## 2014-08-29 ENCOUNTER — Ambulatory Visit (HOSPITAL_COMMUNITY): Payer: Self-pay | Admitting: Psychiatry

## 2014-08-31 DIAGNOSIS — G43719 Chronic migraine without aura, intractable, without status migrainosus: Secondary | ICD-10-CM | POA: Diagnosis not present

## 2014-08-31 DIAGNOSIS — M542 Cervicalgia: Secondary | ICD-10-CM | POA: Diagnosis not present

## 2014-08-31 DIAGNOSIS — G518 Other disorders of facial nerve: Secondary | ICD-10-CM | POA: Diagnosis not present

## 2014-08-31 DIAGNOSIS — R51 Headache: Secondary | ICD-10-CM | POA: Diagnosis not present

## 2014-08-31 DIAGNOSIS — M791 Myalgia: Secondary | ICD-10-CM | POA: Diagnosis not present

## 2014-09-01 ENCOUNTER — Other Ambulatory Visit: Payer: Self-pay | Admitting: Student in an Organized Health Care Education/Training Program

## 2014-09-01 ENCOUNTER — Other Ambulatory Visit (HOSPITAL_COMMUNITY): Payer: Self-pay | Admitting: Psychiatry

## 2014-09-01 ENCOUNTER — Other Ambulatory Visit: Payer: Self-pay | Admitting: Internal Medicine

## 2014-09-01 DIAGNOSIS — R51 Headache: Principal | ICD-10-CM

## 2014-09-01 DIAGNOSIS — R519 Headache, unspecified: Secondary | ICD-10-CM

## 2014-09-01 NOTE — Progress Notes (Addendum)
Patient ID: [name], female   DOB: 17-Dec-1970, 44 y.o.   MRN: [id number]  Subjective: 44 year old female presents the office today for follow-up evaluation of bilateral foot pain. She states that she has consistent pain to her feet for which she points the arch of her feet which also goes into her ankles at times. She does not have much pain at rest and the majority of the pain is with weightbearing and ambulation. She denies any history of injury or trauma. She has continued some pain to the fourth and fifth toes on the left foot as well. She states this has improved. Denies any swelling or redness overlying the toes. Chest presents to discuss nail biopsy results. No other complaints at this time.  Objective: AAO 3, NAD DP/PT pulses palpable, CRT less than 3 seconds Protective sensation intact with Simms Weinstein monofilament; negative Tinel sign Upon weightbearing there is a significant decrease in medial arch height upon weightbearing bilaterally. There is decreased range of motion of the subtalar joint on the right or the left and there is crepitation with range of motion on the right side. Ankle joint range of motion is intact. There is mild discomfort along the dorsal medial aspect of the foot on the talonavicular joint. MTPJ range of motion is intact. There is mild discomfort along the course of the posterior tibial tendon on the insertion into the navicular tuberosity.  There is mild discomfort about fashion along the fourth and fifth digits in the left foot. There is no other areas of tenderness to bilateral lower extremities. There is no overlying edema, erythema, increase in warmth to bilateral lower extremity's. Nails are hypertrophic, dystrophic, discolored, brittle 10. There is no surrounding erythema or drainage on nail sites. No open lesions or pre-ulcerative lesions. No pain with calf compression, swelling, warmth, erythema.  Assessment: 44 year old female with symptomatic  flatfoot deformity, possible healing fractures fourth and fifth left toes; onychodystrophy  Plan: -Treatment options discussed including all alternatives, risks, and complications -I can reviewed x-rays of the patient and I discussed with conservative and surgical treatment options were flatfoot deformity. I do believe that she would benefit from a AFO particularly on the right side given the arthritic changes and flatfoot. I will have her follow up with Betha to evaluate for AFO on the right, although the left is painful as well. Could we do bilateral braces? For now, dispensed trilock ankle brace to see if this helps.  -Taping for left 4th and 5th digital fractures -Discussed nail fungus cultures which is apparently negative for fungus. Clinically does appear to be onychomycosis. I discussed with her she could try an over-the-counter anti-fungal states this helps. Discussed sponge nail. Otherwise continue debridement and discussed possibly area cream. -Follow-up with Betha or sooner if any problems arise. In the meantime, encouraged to call the office with any questions, concerns, change in symptoms.   *Addendum: patient actually went to [facility] for the AFO. She should have it before the next appointment.   Celesta Gentile, DPM

## 2014-09-01 NOTE — Telephone Encounter (Signed)
Received fax from [place] for medication refill on Abilify 5mg . Per Dr. Adele Schilder, pt is authorized for a refill for Abilify 5mg , Qty 30. Prescription was sent to pharmacy. Pt has a f/u appt on 8/30. Called and informed pt of prescription status. PT states and shows understanding.

## 2014-09-02 ENCOUNTER — Other Ambulatory Visit (HOSPITAL_COMMUNITY): Payer: Self-pay | Admitting: *Deleted

## 2014-09-02 DIAGNOSIS — F331 Major depressive disorder, recurrent, moderate: Secondary | ICD-10-CM

## 2014-09-02 MED ORDER — BUPROPION HCL ER (XL) 300 MG PO TB24: ORAL_TABLET | ORAL | Status: DC

## 2014-09-02 NOTE — Telephone Encounter (Signed)
Pt called for refills of Wellbutrin and Clorazepate. Chart reviewed, refill given for Wellbutrin but note on Clorazepate states no further refills until evaluated. Pt does have appointment on 09/27/14. Will clarify with MD if refill can be given.

## 2014-09-05 MED ORDER — CLORAZEPATE DIPOTASSIUM 15 MG PO TABS: 15.0000 mg | ORAL_TABLET | Freq: Every morning | ORAL | Status: DC

## 2014-09-05 NOTE — Addendum Note (Signed)
Addended by: Watt Climes on: 09/05/2014 04:15 PM   Modules accepted: Orders

## 2014-09-05 NOTE — Telephone Encounter (Signed)
Met with Dr. Adele Schilder who authorized another one time refill of patient's Tranxene since patient's appointment was moved back now to 09/27/14.  Called patient and she will pick up new order on 09/06/14.   Agrees to keep rescheduled appointment for 09/27/14.

## 2014-09-07 ENCOUNTER — Ambulatory Visit (INDEPENDENT_AMBULATORY_CARE_PROVIDER_SITE_OTHER): Payer: Medicare Other | Admitting: Clinical

## 2014-09-07 ENCOUNTER — Telehealth (HOSPITAL_COMMUNITY): Payer: Self-pay

## 2014-09-07 DIAGNOSIS — F410 Panic disorder [episodic paroxysmal anxiety] without agoraphobia: Secondary | ICD-10-CM

## 2014-09-07 DIAGNOSIS — F411 Generalized anxiety disorder: Secondary | ICD-10-CM

## 2014-09-07 DIAGNOSIS — F331 Major depressive disorder, recurrent, moderate: Secondary | ICD-10-CM

## 2014-09-07 NOTE — Telephone Encounter (Signed)
[place] picked up prescription on 09/07/14 dlo

## 2014-09-12 ENCOUNTER — Encounter (HOSPITAL_COMMUNITY): Payer: Self-pay | Admitting: Clinical

## 2014-09-12 DIAGNOSIS — G5600 Carpal tunnel syndrome, unspecified upper limb: Secondary | ICD-10-CM | POA: Diagnosis not present

## 2014-09-12 DIAGNOSIS — G629 Polyneuropathy, unspecified: Secondary | ICD-10-CM | POA: Diagnosis not present
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
## 2014-09-12 NOTE — Progress Notes (Signed)
   THERAPIST PROGRESS NOTE  Session Time: 9:03 - 10:00  Participation Level: Active  Behavioral Response: CasualLethargicDepressed  Type of Therapy: Individual Therapy  Treatment Goals addressed: improve psychiatric symptoms, Calming skills  Interventions: Motivational Interviewing  Summary: Raynelle Fujikawa is a 44 y.o. female who presents with Major depressive disorder, recurrent episode, moderate, and generalized anxiety disorder.   Suicidal/Homicidal: Nowithout intent/plan  Therapist Response: Avri met with clinician for an individual session. Coree discussed his psychiatric symptoms and his current life events. Pearla and clinician had completed an assessment on 03/01/14. She reported that he had a problem scheduling and then making it to his follow up appointment. She reports that her symptoms have not improve. Client and clinician reviewed and updated her assessment and discussed her goals for therapy. Clinician introduced grounding and mindfulness techniques. Clinician explained the purpose, practice and application of the techniques. Client and clinician practiced some of the techniques together. Kamyia  asked some clarifying questions which clinician answered. Lilinoe agreed to practice the techniques daily until next session.  Plan: Return again in 1-2  weeks.  Diagnosis: Axis I: Major depressive disorder, recurrent episode, moderate, and generalized anxiety disorder.        Rutha Melgoza A, LCSW 09/12/2014

## 2014-09-14 ENCOUNTER — Ambulatory Visit (HOSPITAL_COMMUNITY): Payer: Self-pay | Admitting: Psychiatry

## 2014-09-26 ENCOUNTER — Telehealth: Payer: Self-pay | Admitting: *Deleted

## 2014-09-26 ENCOUNTER — Encounter: Payer: Self-pay | Admitting: Internal Medicine

## 2014-09-26 ENCOUNTER — Ambulatory Visit (INDEPENDENT_AMBULATORY_CARE_PROVIDER_SITE_OTHER): Payer: Medicare Other | Admitting: Internal Medicine

## 2014-09-26 ENCOUNTER — Ambulatory Visit: Payer: Self-pay | Admitting: Internal Medicine

## 2014-09-26 ENCOUNTER — Ambulatory Visit: Payer: Medicare Other | Admitting: Podiatry

## 2014-09-26 VITALS — BP 155/87 | HR 85 | Temp 98.6°F | Ht 71.0 in | Wt 322.7 lb

## 2014-09-26 DIAGNOSIS — M25562 Pain in left knee: Secondary | ICD-10-CM | POA: Insufficient documentation

## 2014-09-26 DIAGNOSIS — F17210 Nicotine dependence, cigarettes, uncomplicated: Secondary | ICD-10-CM | POA: Diagnosis not present

## 2014-09-26 DIAGNOSIS — M79606 Pain in leg, unspecified: Secondary | ICD-10-CM

## 2014-09-26 DIAGNOSIS — Z0000 Encounter for general adult medical examination without abnormal findings: Secondary | ICD-10-CM

## 2014-09-26 DIAGNOSIS — M7989 Other specified soft tissue disorders: Secondary | ICD-10-CM | POA: Diagnosis present

## 2014-09-26 DIAGNOSIS — G8929 Other chronic pain: Secondary | ICD-10-CM | POA: Insufficient documentation

## 2014-09-26 LAB — D-DIMER, QUANTITATIVE: D-Dimer, Quant: <0.27 ug/mL-FEU (ref 0.00–0.48)

## 2014-09-26 NOTE — Telephone Encounter (Signed)
Received a call from pt with complaints about last visit in clinic. She states her legs are swollen X 4 days on rt and X 1 1/2 days on left.  She is in pain and fells like doctor just brushed her off. I told pt I would talk with doctor and call her back in morning.  She can come back for a f/u opinion tomorrow.
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
## 2014-09-26 NOTE — Patient Instructions (Signed)
-  Will check your bloodwork today and will call you with the results  -We may need to do a ultrasound of your legs to make sure there's no clot in your leg -Keep using the boot and see your podiatrist soon   General Instructions:   Please bring your medicines with you each time you come to clinic.  Medicines may include prescription medications, over-the-counter medications, herbal remedies, eye drops, vitamins, or other pills.   Progress Toward Treatment Goals:  No flowsheet data found.  Self Care Goals & Plans:  Self Care Goal 09/26/2014  Manage my medications take my medicines as prescribed; bring my medications to every visit; refill my medications on time; follow the sick day instructions if I am sick  Monitor my health keep track of my weight  Eat healthy foods eat more vegetables; eat fruit for snacks and desserts; eat baked foods instead of fried foods; eat smaller portions  Be physically active find an activity I enjoy  Stop smoking -    No flowsheet data found.   Care Management & Community Referrals:  No flowsheet data found.

## 2014-09-26 NOTE — Assessment & Plan Note (Signed)
-  Obtain screening HIV Ab at next visit  -Inquire about annual influenza vaccination at next visit

## 2014-09-26 NOTE — Progress Notes (Signed)
Patient ID: Lindsay [name], female   DOB: [birth year], 44 y.o.   MRN: [id number]    Subjective:   Patient ID: Lindsay [name] female   DOB: [birth year] 44 y.o.   MRN: [id number]  HPI: [name] is a 44 y.o. woman with past medical history of depression, anxiety, IBS, migraine headaches, obesity, and GERD who presents with chief complaint of left lower extremity swelling.   She reports tripping over her daughter's slipper about 2 months ago with resulting hyperextension of her toes. She was seen by podiatry and had xray imaging of both feet on 8/4 which revealed possible left proximal phalanx fracture of the 4th and 5th digits. She also had evidence of flatfoot deformity and spurring in both feet. Her right foot revealed arthritic changes. She has been wearing a boot on her left foot that was given to her by podiatry. She was able to bear weight on her left LE but limping at times due to pain. For the past 1.5 weeks she has noticed left LE edema and pain without erythema or warmth. She has not been ambulating as much with resulting right LE swelling for the past 4 days. She reports "bumping" her left toe about 2 weeks ago. She denies history of DVT. Her last CMP on 07/17/14 revealed normal kidney and liver function and UA revealed no proteinuria. She reports having nephrotic syndrome in the past. She does not have history of CHF and denies dyspnea with exertion, orthopnea, weight gain, and PND. She denies history of thyroid problems. She has been using tramadol for the pain with moderate relief of pain. She was to see her podiatrist today but rescheduled. She has chronic neuropathy in her feet and is on gabapentin 400 mg daily which was previously reduced in dosage to interaction with her other medications. She has occasional chills but denies fever, pleuritic chest pain, palpitations, or dyspnea.     Past Medical History  Diagnosis Date  . Depression with anxiety   . Hypertension   . Anorexia   .  IBS (irritable bowel syndrome)   . Low back pain   . Edema leg   . Hemorrhoids   . Panic attacks   . Anemia     Iron Def  . Colon polyp 2009  . Migraines   . Colon polyps   . Neuromuscular disorder     fibromyalgia  . Tonsil pain   . Polyp of vocal cord or larynx   . Hidradenitis suppurativa   . Depression   . Chronic kidney disease     Nephrotic syndrome   Current Outpatient Prescriptions  Medication Sig Dispense Refill  . ARIPiprazole (ABILIFY) 5 MG tablet TAKE 1 TABLET (5 MG TOTAL) BY MOUTH DAILY. 30 tablet 0  . baclofen (LIORESAL) 10 MG tablet TAKE 1 TABLET BY MOUTH TWICE A DAY AS NEEDED . AVOID DAILY USE. LIMIIT USE TO 1-2 DAYS PER WEEK  0  . buPROPion (WELLBUTRIN XL) 300 MG 24 hr tablet TAKE 1 TABLET (300 MG TOTAL) BY MOUTH EVERY MORNING. 30 tablet 0  . clorazepate (TRANXENE) 15 MG tablet Take 1 tablet (15 mg total) by mouth every morning. 30 tablet 0  . gabapentin (NEURONTIN) 400 MG capsule Take 1 capsule (400 mg total) by mouth daily. 30 capsule 2  . hydrOXYzine (VISTARIL) 25 MG capsule Take 1 capsule (25 mg total) by mouth daily as needed for anxiety. 30 capsule 0  . omeprazole (PRILOSEC) 40 MG capsule Take 40 mg by mouth daily.    [name]  promethazine (PHENERGAN) 25 MG tablet TAKE 1 TABLET (25 MG TOTAL) BY MOUTH EVERY 6 (SIX) HOURS AS NEEDED FOR NAUSEA. 42 tablet 2  . topiramate (TOPAMAX) 25 MG tablet TAKE 1 TABLET (25 MG TOTAL) BY MOUTH DAILY. 30 tablet 2  . traMADol (ULTRAM) 50 MG tablet Take 1 tablet (50 mg total) by mouth every 8 (eight) hours as needed. 30 tablet 0  . zonisamide (ZONEGRAN) 25 MG capsule INCREASE BY 25MG  WEEKLY AS DIRECTED TO A TOTAL OF 4 AT BEDTIME  1   No current facility-administered medications for this visit.   Family History  Problem Relation Age of Onset  . Colon cancer Neg Hx   . Diabetes Mother   . Heart disease Mother     valve leak  . Anxiety disorder Mother   . Depression Mother   . Cancer Father     prostate  . Heart disease Father     . Learning disabilities Sister   . Depression Sister   . Depression Sister   . Anxiety disorder Sister    Social History   Social History  . Marital Status: Single    Spouse Name: N/A  . Number of Children: N/A  . Years of Education: N/A   Social History Main Topics  . Smoking status: Current Some Day Smoker -- 0.50 packs/day for 25 years    Types: Cigarettes  . Smokeless tobacco: Never Used     Comment: 10 cigarette a day  . Alcohol Use: No     Comment: Occasional  . Drug Use: No  . Sexual Activity: No     Comment: States she has a Lenda Kelp that was placed aprroximately 2 years ago   Other Topics Concern  . None   Social History Narrative   Review of Systems: Review of Systems  Constitutional: Positive for chills. Negative for fever.  Eyes: Negative for blurred vision.  Respiratory: Negative for cough, shortness of breath and wheezing.   Cardiovascular: Positive for leg swelling (Left>right). Negative for chest pain, palpitations and orthopnea.  Gastrointestinal: Positive for diarrhea (loose stools). Negative for nausea, vomiting, abdominal pain and constipation.  Genitourinary: Negative for dysuria, urgency and frequency.  Musculoskeletal: Positive for joint pain (b/l feet). Negative for falls.  Neurological: Positive for sensory change (b/l feet) and headaches (chronic migraines).  Psychiatric/Behavioral: Positive for depression (follows with psychiatry ).    Objective:  Physical Exam: Filed Vitals:   09/26/14 1440  BP: 155/87  Pulse: 85  Temp: 98.6 F (37 C)  TempSrc: Oral  Height: 5\' 11"  (1.803 m)  Weight: 322 lb 11.2 oz (146.376 kg)  SpO2: 100%    Physical Exam  Constitutional: She is oriented to person, place, and time. She appears well-developed and well-nourished. No distress.  HENT:  Head: Normocephalic and atraumatic.  Right Ear: External ear normal.  Left Ear: External ear normal.  Nose: Nose normal.  Mouth/Throat: Oropharynx is clear and  moist. No oropharyngeal exudate.  Eyes: Conjunctivae and EOM are normal. Pupils are equal, round, and reactive to light. Right eye exhibits no discharge. Left eye exhibits no discharge. No scleral icterus.  Neck: Normal range of motion. Neck supple.  Cardiovascular: Normal rate, regular rhythm and normal heart sounds.   Pulmonary/Chest: Effort normal and breath sounds normal. No respiratory distress. She has no wheezes. She has no rales.  Abdominal: Soft. Bowel sounds are normal. She exhibits no distension. There is no tenderness. There is no rebound and no guarding.  Musculoskeletal: She exhibits edema (Difficult  to assess extent of swelling due to excessive adipose tissue, +1 left LE nonpitting edema to knee, trace right LE edema ) and tenderness (b/l LE and b/l feet).  Neurological: She is alert and oriented to person, place, and time.  Skin: Skin is warm and dry. No rash noted. She is not diaphoretic. No erythema. No pallor.  Psychiatric:  Angry at times    Assessment & Plan:   Please see problem list for problem-based assessment and plan

## 2014-09-27 ENCOUNTER — Ambulatory Visit (INDEPENDENT_AMBULATORY_CARE_PROVIDER_SITE_OTHER): Payer: Medicare Other | Admitting: Psychiatry

## 2014-09-27 ENCOUNTER — Encounter (HOSPITAL_COMMUNITY): Payer: Self-pay | Admitting: Psychiatry

## 2014-09-27 VITALS — BP 137/83 | HR 93 | Ht 71.0 in | Wt 322.7 lb

## 2014-09-27 DIAGNOSIS — F331 Major depressive disorder, recurrent, moderate: Secondary | ICD-10-CM

## 2014-09-27 DIAGNOSIS — F411 Generalized anxiety disorder: Secondary | ICD-10-CM | POA: Diagnosis not present

## 2014-09-27 MED ORDER — CLORAZEPATE DIPOTASSIUM 15 MG PO TABS: 15.0000 mg | ORAL_TABLET | Freq: Every morning | ORAL | Status: DC

## 2014-09-27 MED ORDER — BUPROPION HCL ER (XL) 300 MG PO TB24: ORAL_TABLET | ORAL | Status: DC

## 2014-09-27 MED ORDER — GABAPENTIN 400 MG PO CAPS: 400.0000 mg | ORAL_CAPSULE | Freq: Every day | ORAL | Status: DC

## 2014-09-27 MED ORDER — BREXPIPRAZOLE 1 MG PO TABS: 1.0000 mg | ORAL_TABLET | Freq: Every day | ORAL | Status: DC

## 2014-09-27 NOTE — Telephone Encounter (Signed)
Talked with pt again and scheduled her for a f/u visit on Friday Sept 2 with Dr Hulen Luster. Pt agrees.
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
## 2014-09-30 ENCOUNTER — Ambulatory Visit (INDEPENDENT_AMBULATORY_CARE_PROVIDER_SITE_OTHER): Payer: Medicare Other | Admitting: Internal Medicine

## 2014-09-30 ENCOUNTER — Encounter: Payer: Self-pay | Admitting: Internal Medicine

## 2014-09-30 VITALS — BP 147/82 | HR 88 | Temp 98.3°F | Ht 71.0 in | Wt 327.0 lb

## 2014-09-30 DIAGNOSIS — R0602 Shortness of breath: Secondary | ICD-10-CM

## 2014-09-30 DIAGNOSIS — R635 Abnormal weight gain: Secondary | ICD-10-CM | POA: Diagnosis not present

## 2014-09-30 DIAGNOSIS — R600 Localized edema: Secondary | ICD-10-CM | POA: Diagnosis not present

## 2014-09-30 LAB — BRAIN NATRIURETIC PEPTIDE: B NATRIURETIC PEPTIDE 5: 27.3 pg/mL (ref 0.0–100.0)

## 2014-09-30 MED ORDER — GABAPENTIN 300 MG PO CAPS: 300.0000 mg | ORAL_CAPSULE | Freq: Three times a day (TID) | ORAL | Status: DC

## 2014-09-30 MED ORDER — PROMETHAZINE HCL 25 MG PO TABS: ORAL_TABLET | ORAL | Status: DC

## 2014-09-30 NOTE — Assessment & Plan Note (Signed)
Endorses SOB x 3.5 weeks. Lungs CTAB on exam. Having increased pedal edema, 2+ today b/l compared to 1+ on rt and trace on left 4 days ago. Also complaining of fatigue and has a hx of iron deficiency anemia.   - checking CBC and BNP

## 2014-09-30 NOTE — Assessment & Plan Note (Signed)
Pt fractured her 4th and 5th metatarsals on left foot 3 months ago. She then developed lt LE edema and was seen in clinic for this 4 days ago. D- dimer at that time was negative. Today she is complaining of b/l LE edema up to her thighs with a numb/tingling sensation on the top of her feet up to her thigh. Edema has actually improved today compared to Monday. This is associated with a sharp shooting sensation that starts at her toes. She has b/l leg pain that feels like a constant ache. Nothing makes pain better, she was given tramadol at her last appt but has not used it b/c it sets off her migraines. Movement aggravates pain. She denies being sedentary and has been more active with spring cleaning and walking her daughter to the bus stop. She does not have difficulty with ambulation. NCS were ordered by her headache specialist and is schedule for Sept 15th. She is currently on gabapentin 400mg  qhs. She also endorses increased SOB x 3.5 weeks, moving in bed will cause her to be SOB. She has a hx of nephrotic syndrome and she had HTN with mild hand edema on presentation during that time. She has also been drinking more water than usual. Today she weighs 327lbs and 4 days ago weighed 322lbs, she states is wearing the same clothes as that visit 4 days ago. Likely pt experiencing neuropathy, other DDx include CHF, nephrotic syndrome, psychogenic polydipsia, hypoalbuminemia, liver etiology, and hypothyroidism.   - CMET  - albumin - BNP, TSH, UA, CBC - compression stockings given to patient in the clinic, instructed to use on rt leg. She can start using on left leg once fractured toes are well healed.  - increased gabapentin to 300mg  TID  - f/u in 2 weeks.

## 2014-09-30 NOTE — Progress Notes (Signed)
   Subjective:    Patient ID: Lindsay [name], female    DOB: 11/23/1970, 44 y.o.   MRN: [id number]  HPI 43 y/o F w/ PMHx of IBS, seasonal allergies, anxiety, depression, and morbid obesity who presents for LE swelling and weight gain. Please see problem list for further details.      Review of Systems  Constitutional: Positive for activity change (increased activity level), fatigue and unexpected weight change (weight gain). Negative for fever.  Respiratory: Positive for shortness of breath.   Cardiovascular: Positive for leg swelling. Negative for chest pain.  Endocrine: Positive for polydipsia.  Genitourinary: Negative for difficulty urinating.  Musculoskeletal: Positive for joint swelling. Negative for gait problem.  Skin: Negative for color change and pallor.  Neurological: Positive for numbness (b/l LEs).       Objective:   Physical Exam  Constitutional: She appears well-developed and well-nourished. No distress.  HENT:  Head: Normocephalic.  Cardiovascular: Normal rate, regular rhythm and normal heart sounds.   No murmur heard. Pulmonary/Chest: Effort normal and breath sounds normal. No respiratory distress. She has no wheezes. She has no rales.  Abdominal: Soft. Bowel sounds are normal. She exhibits distension (obese). There is no tenderness.  Musculoskeletal: She exhibits edema (2+ pitting edema up to mid calf b/l).  Neurological: She is alert.  Intact sensation in b/l LE.   Skin: Skin is warm and dry. No pallor (neg for conjuctival pallor).          Assessment & Plan:  Please see problem based assessment and plan.

## 2014-09-30 NOTE — Patient Instructions (Signed)
Start taking gabapentin 300mg  three times a day. You can use ted hose for your rt leg swelling as well.   If you continue to notice an increase in your weight or shortness of breath call the clinic for an appointment.

## 2014-10-01 LAB — CBC
HEMOGLOBIN: 12.9 g/dL (ref 11.1–15.9)
Hematocrit: 41.5 % (ref 34.0–46.6)
MCH: 27.4 pg (ref 26.6–33.0)
MCHC: 31.1 g/dL — AB (ref 31.5–35.7)
MCV: 88 fL (ref 79–97)
Platelets: 356 10*3/uL (ref 150–379)
RBC: 4.7 x10E6/uL (ref 3.77–5.28)
RDW: 15.2 % (ref 12.3–15.4)
WBC: 7.2 10*3/uL (ref 3.4–10.8)

## 2014-10-01 LAB — URINALYSIS, ROUTINE W REFLEX MICROSCOPIC
Bilirubin, UA: NEGATIVE
Glucose, UA: NEGATIVE
KETONES UA: NEGATIVE
LEUKOCYTES UA: NEGATIVE
Nitrite, UA: NEGATIVE
PROTEIN UA: NEGATIVE
RBC, UA: NEGATIVE
SPEC GRAV UA: 1.017 (ref 1.005–1.030)
Urobilinogen, Ur: 0.2 mg/dL (ref 0.2–1.0)
pH, UA: 5.5 (ref 5.0–7.5)

## 2014-10-01 LAB — COMPREHENSIVE METABOLIC PANEL
ALBUMIN: 4 g/dL (ref 3.5–5.5)
ALT: 10 IU/L (ref 0–32)
AST: 10 IU/L (ref 0–40)
Albumin/Globulin Ratio: 1.5 (ref 1.1–2.5)
Alkaline Phosphatase: 73 IU/L (ref 39–117)
BUN / CREAT RATIO: 11 (ref 9–23)
BUN: 9 mg/dL (ref 6–24)
Bilirubin Total: 0.3 mg/dL (ref 0.0–1.2)
CALCIUM: 8.6 mg/dL — AB (ref 8.7–10.2)
CO2: 21 mmol/L (ref 18–29)
CREATININE: 0.79 mg/dL (ref 0.57–1.00)
Chloride: 105 mmol/L (ref 97–108)
GFR, EST AFRICAN AMERICAN: 105 mL/min/{1.73_m2} (ref >59)
GFR, EST NON AFRICAN AMERICAN: 91 mL/min/{1.73_m2} (ref >59)
GLUCOSE: 88 mg/dL (ref 65–99)
Globulin, Total: 2.6 g/dL (ref 1.5–4.5)
Potassium: 4.4 mmol/L (ref 3.5–5.2)
Sodium: 139 mmol/L (ref 134–144)
TOTAL PROTEIN: 6.6 g/dL (ref 6.0–8.5)

## 2014-10-01 LAB — TSH: TSH: 0.594 u[IU]/mL (ref 0.450–4.500)

## 2014-10-02 ENCOUNTER — Other Ambulatory Visit (HOSPITAL_COMMUNITY): Payer: Self-pay | Admitting: Psychiatry

## 2014-10-04 NOTE — Progress Notes (Signed)
Internal Medicine Clinic Attending  Case discussed with Dr. Truong at the time of the visit.  We reviewed the resident's history and exam and pertinent patient test results.  I agree with the assessment, diagnosis, and plan of care documented in the resident's note.  

## 2014-10-04 NOTE — Progress Notes (Signed)
Internal Medicine Clinic Attending  Case discussed with Dr. Rabbani soon after the resident saw the patient.  We reviewed the resident's history and exam and pertinent patient test results.  I agree with the assessment, diagnosis, and plan of care documented in the resident's note.  

## 2014-10-04 NOTE — Addendum Note (Signed)
Addended by: Truddie Crumble on: 10/04/2014 09:10 AM   Modules accepted: Orders

## 2014-10-11 ENCOUNTER — Ambulatory Visit (HOSPITAL_COMMUNITY): Payer: Self-pay | Admitting: Clinical

## 2014-10-13 DIAGNOSIS — G629 Polyneuropathy, unspecified: Secondary | ICD-10-CM | POA: Diagnosis not present

## 2014-10-19 ENCOUNTER — Other Ambulatory Visit: Payer: Self-pay | Admitting: Podiatry

## 2014-10-19 DIAGNOSIS — G518 Other disorders of facial nerve: Secondary | ICD-10-CM | POA: Diagnosis not present

## 2014-10-19 DIAGNOSIS — M791 Myalgia: Secondary | ICD-10-CM | POA: Diagnosis not present

## 2014-10-19 DIAGNOSIS — R51 Headache: Secondary | ICD-10-CM | POA: Diagnosis not present

## 2014-10-19 DIAGNOSIS — G43719 Chronic migraine without aura, intractable, without status migrainosus: Secondary | ICD-10-CM | POA: Diagnosis not present

## 2014-10-19 DIAGNOSIS — M542 Cervicalgia: Secondary | ICD-10-CM | POA: Diagnosis not present

## 2014-10-19 NOTE — Telephone Encounter (Signed)
Pt request Tramadol refill last appt 09/02/2014.  I spoke with pt and asked if she was having a lot of pain, and she states that in the last 3 weeks she has had a lot of pain and swelling.  I offered pt an appt, and transferred to schedulers.

## 2014-10-20 ENCOUNTER — Encounter: Payer: Self-pay | Admitting: Internal Medicine

## 2014-10-20 ENCOUNTER — Ambulatory Visit (INDEPENDENT_AMBULATORY_CARE_PROVIDER_SITE_OTHER): Payer: Medicare Other | Admitting: Internal Medicine

## 2014-10-20 VITALS — BP 124/68 | HR 86 | Temp 98.6°F | Ht 71.0 in | Wt 322.5 lb

## 2014-10-20 DIAGNOSIS — G5792 Unspecified mononeuropathy of left lower limb: Secondary | ICD-10-CM | POA: Diagnosis not present

## 2014-10-20 DIAGNOSIS — G5793 Unspecified mononeuropathy of bilateral lower limbs: Secondary | ICD-10-CM

## 2014-10-20 DIAGNOSIS — R600 Localized edema: Secondary | ICD-10-CM | POA: Diagnosis not present

## 2014-10-20 DIAGNOSIS — G5791 Unspecified mononeuropathy of right lower limb: Secondary | ICD-10-CM

## 2014-10-20 DIAGNOSIS — R0602 Shortness of breath: Secondary | ICD-10-CM | POA: Diagnosis not present

## 2014-10-20 DIAGNOSIS — Z23 Encounter for immunization: Secondary | ICD-10-CM

## 2014-10-20 MED ORDER — TRAMADOL HCL 50 MG PO TABS: 50.0000 mg | ORAL_TABLET | Freq: Three times a day (TID) | ORAL | Status: DC | PRN

## 2014-10-20 MED ORDER — PROMETHAZINE HCL 25 MG PO TABS: ORAL_TABLET | ORAL | Status: DC

## 2014-10-20 MED ORDER — GABAPENTIN 400 MG PO CAPS: 400.0000 mg | ORAL_CAPSULE | Freq: Three times a day (TID) | ORAL | Status: DC

## 2014-10-20 NOTE — Progress Notes (Signed)
   Subjective:    Patient ID: Lindsay [name], female    DOB: 22-Oct-1970, 44 y.o.   MRN: [id number]  HPI Lindsay [name] is a 44 y.o. with PMH of morbid obesity, anxiety and depression, and bilateral lower extremity pain and swelling who presents to [facility] today for routine follow-up. Please see problem-based charting for further pertinent information.    Review of Systems  Constitutional: Positive for unexpected weight change. Negative for fever, chills and diaphoresis.  HENT: Negative for ear pain, sinus pressure and sore throat.   Eyes: Negative for visual disturbance.  Respiratory: Negative for cough, shortness of breath and wheezing.   Cardiovascular: Positive for leg swelling. Negative for chest pain and palpitations.  Gastrointestinal: Negative for nausea, vomiting, abdominal pain, diarrhea, constipation and blood in stool.  Endocrine: Negative for cold intolerance, heat intolerance, polydipsia, polyphagia and polyuria.  Genitourinary: Negative for dysuria, urgency, frequency, flank pain, decreased urine volume and difficulty urinating.  Musculoskeletal: Positive for joint swelling and arthralgias. Negative for back pain and gait problem.  Skin: Negative for rash.  Neurological: Positive for numbness. Negative for dizziness, syncope, weakness and headaches.  Hematological: Does not bruise/bleed easily.  Psychiatric/Behavioral: Negative for confusion and agitation.  All other systems reviewed and are negative.      Objective:   Physical Exam  Constitutional: She is oriented to person, place, and time. She appears well-nourished. No distress.  HENT:  Head: Normocephalic and atraumatic.  Right Ear: External ear normal.  Left Ear: External ear normal.  Eyes: Conjunctivae are normal. Pupils are equal, round, and reactive to light.  Neck: Normal range of motion. Neck supple.  Cardiovascular: Normal rate, regular rhythm, normal heart sounds and intact distal pulses.  Exam reveals no  gallop and no friction rub.   No murmur heard. Pulmonary/Chest: Effort normal and breath sounds normal. She has no wheezes. She has no rales.  Abdominal: Soft. Bowel sounds are normal. She exhibits no distension. There is no tenderness.  Musculoskeletal: Normal range of motion. She exhibits edema. She exhibits no tenderness.  Patient has L foot brace from metatarsal fracture. Swelling up to midcalf bilaterally, left worse than right. Left is pitting, right is non-pitting. Strength 5/5 in all extremities bilaterally, normal ROM in bilat lower extremities.  Neurological: She is alert and oriented to person, place, and time. She has normal reflexes. No cranial nerve deficit. Coordination normal.  There is decreased sensation to light touch symmetrically in the distal phalanges. Normal sensation in the palmar and dorsal aspects of the hands. Decreased sensation in the toes bilaterally, symmetric. No other neurologic deficits appreciated on exam.  Skin: Skin is warm. No rash noted. She is not diaphoretic. No erythema.  Psychiatric: She has a normal mood and affect. Her behavior is normal.  Nursing note and vitals reviewed.         Assessment & Plan:  Please see problem-based charting for assessment and plan.  Blane Ohara, MD Resident Physician, PGY-1 Department of Internal Medicine [facility]

## 2014-10-20 NOTE — Progress Notes (Signed)
Medicine attending: I personally interviewed and briefly examined this patient, and reviewed pertinent clinical laboratory  data  with resident physician Dr.William Merrilyn Puma and we discussed a   management plan. Idiopathic painful, distal neuropathy, hands and feet in a young, non-diabetic, non-anemic, overly nourished woman. No lead or toxic exposures. Currently on gabapentin 300 mg TID. Recent nerve conduction studies - told c/w carpal tunnel but this does not explain lower extrem sxs. We will do a screening lab panel & urine heavy metal screen. Increase gaba dose while eval in progress.

## 2014-10-20 NOTE — Assessment & Plan Note (Signed)
Patient states SOB has improved, and CBC, BNP normal.

## 2014-10-20 NOTE — Assessment & Plan Note (Signed)
Pt still endorses L > R LE swelling as well as achy pain in knees bilaterally and shooting pain/numbness in both feet, exacerbated by walking. Weight is stable from last visit 3 weeks ago. Her headache specialist did nerve conduction studies which showed bilateral moderate carpal tunnel as well as peripheral neuropathy in both lower extremities. Labs drawn 3 weeks ago otherwise negative for diabetes, renal, cardiac, hepatic, thyroid, polydipsia etiologies. Of note, patient was unable to wear compression stockings because didn't fit her. In light of normal previous labs, other etiologies include chronic venous insufficiency, folate/B12 deficiencies, heavy metal exposure, and monoclonal gammaopathies, although unlikely.  -Order IFE, urine heavy metal screen, B12/MMA, folate -Re-measure compression stockings and provide -Re-filled tramadol; neurologist said will not exacerbate headaches and patient says she has had no issue with this in the past -Increased gabapentin to 400mg  TID. -F/u in 1 month

## 2014-10-20 NOTE — Progress Notes (Signed)
TED hose placed in the outgoing mail.

## 2014-10-20 NOTE — Patient Instructions (Signed)
Gabapentin capsules or tablets What is this medicine? GABAPENTIN (GA ba pen tin) is used to control partial seizures in adults with epilepsy. It is also used to treat certain types of nerve pain. This medicine may be used for other purposes; ask your health care provider or pharmacist if you have questions. COMMON BRAND NAME(S): Gabarone, Neurontin What should I tell my health care provider before I take this medicine? They need to know if you have any of these conditions: -kidney disease -suicidal thoughts, plans, or attempt; a previous suicide attempt by you or a family member -an unusual or allergic reaction to gabapentin, other medicines, foods, dyes, or preservatives -pregnant or trying to get pregnant -breast-feeding How should I use this medicine? Take this medicine by mouth with a glass of water. Follow the directions on the prescription label. You can take it with or without food. If it upsets your stomach, take it with food.Take your medicine at regular intervals. Do not take it more often than directed. Do not stop taking except on your doctor's advice. If you are directed to break the 600 or 800 mg tablets in half as part of your dose, the extra half tablet should be used for the next dose. If you have not used the extra half tablet within 28 days, it should be thrown away. A special MedGuide will be given to you by the pharmacist with each prescription and refill. Be sure to read this information carefully each time. Talk to your pediatrician regarding the use of this medicine in children. Special care may be needed. Overdosage: If you think you have taken too much of this medicine contact a poison control center or emergency room at once. NOTE: This medicine is only for you. Do not share this medicine with others. What if I miss a dose? If you miss a dose, take it as soon as you can. If it is almost time for your next dose, take only that dose. Do not take double or extra  doses. What may interact with this medicine? Do not take this medicine with any of the following medications: -other gabapentin products This medicine may also interact with the following medications: -alcohol -antacids -antihistamines for allergy, cough and cold -certain medicines for anxiety or sleep -certain medicines for depression or psychotic disturbances -homatropine; hydrocodone -naproxen -narcotic medicines (opiates) for pain -phenothiazines like chlorpromazine, mesoridazine, prochlorperazine, thioridazine This list may not describe all possible interactions. Give your health care provider a list of all the medicines, herbs, non-prescription drugs, or dietary supplements you use. Also tell them if you smoke, drink alcohol, or use illegal drugs. Some items may interact with your medicine. What should I watch for while using this medicine? Visit your doctor or health care professional for regular checks on your progress. You may want to keep a record at home of how you feel your condition is responding to treatment. You may want to share this information with your doctor or health care professional at each visit. You should contact your doctor or health care professional if your seizures get worse or if you have any new types of seizures. Do not stop taking this medicine or any of your seizure medicines unless instructed by your doctor or health care professional. Stopping your medicine suddenly can increase your seizures or their severity. Wear a medical identification bracelet or chain if you are taking this medicine for seizures, and carry a card that lists all your medications. You may get drowsy, dizzy, or have blurred   vision. Do not drive, use machinery, or do anything that needs mental alertness until you know how this medicine affects you. To reduce dizzy or fainting spells, do not sit or stand up quickly, especially if you are an older patient. Alcohol can increase drowsiness and  dizziness. Avoid alcoholic drinks. Your mouth may get dry. Chewing sugarless gum or sucking hard candy, and drinking plenty of water will help. The use of this medicine may increase the chance of suicidal thoughts or actions. Pay special attention to how you are responding while on this medicine. Any worsening of mood, or thoughts of suicide or dying should be reported to your health care professional right away. Women who become pregnant while using this medicine may enroll in the [place] American Antiepileptic Drug Pregnancy Registry by calling 1-888-233-2334. This registry collects information about the safety of antiepileptic drug use during pregnancy. What side effects may I notice from receiving this medicine? Side effects that you should report to your doctor or health care professional as soon as possible: -allergic reactions like skin rash, itching or hives, swelling of the face, lips, or tongue -worsening of mood, thoughts or actions of suicide or dying Side effects that usually do not require medical attention (report to your doctor or health care professional if they continue or are bothersome): -constipation -difficulty walking or controlling muscle movements -dizziness -nausea -slurred speech -tiredness -tremors -weight gain This list may not describe all possible side effects. Call your doctor for medical advice about side effects. You may report side effects to FDA at 1-800-FDA-1088. Where should I keep my medicine? Keep out of reach of children. Store at room temperature between 15 and 30 degrees C (59 and 86 degrees F). Throw away any unused medicine after the expiration date. NOTE: This sheet is a summary. It may not cover all possible information. If you have questions about this medicine, talk to your doctor, pharmacist, or health care provider.  2015, Elsevier/Gold Standard. (2012-09-17 09:12:48)  

## 2014-10-21 ENCOUNTER — Ambulatory Visit: Payer: Medicare Other | Admitting: Podiatry

## 2014-10-21 LAB — FOLATE RBC
FOLATE, HEMOLYSATE: 395.9 ng/mL
Folate, RBC: 995 ng/mL (ref >498)
Hematocrit: 39.8 % (ref 34.0–46.6)

## 2014-10-22 LAB — PROTEIN ELECTROPHORESIS, SERUM, WITH REFLEX
A/G Ratio: 1 (ref 0.7–1.7)
ALBUMIN ELP: 3.3 g/dL (ref 2.9–4.4)
ALPHA 1: 0.3 g/dL (ref 0.0–0.4)
Alpha 2: 0.7 g/dL (ref 0.4–1.0)
Beta: 0.9 g/dL (ref 0.7–1.3)
Gamma Globulin: 1.3 g/dL (ref 0.4–1.8)
Globulin, Total: 3.2 g/dL (ref 2.2–3.9)
TOTAL PROTEIN: 6.5 g/dL (ref 6.0–8.5)

## 2014-10-23 LAB — METHYLMALONIC ACID, SERUM: Methylmalonic Acid: 92 nmol/L (ref 0–378)

## 2014-10-24 LAB — HEAVY METALS PROFILE II, URINE
ARSENIC UR: 19 ug/L (ref 0–50)
Arsenic(Inorganic),U: NOT DETECTED ug/L (ref 0–19)
Arsenic, 24H Ur: 1 ug/24 hr (ref 0–50)
CADMIUM 24H UR: 0.1 ug/(24.h) (ref 0.0–2.9)
CADMIUM UR: 2.5 ug/L
CADMIUM/CREAT. RATIO, URINE: 0.6 ug/g{creat} (ref 0.0–1.9)
CREATININE(CRT), U: 4.54 g/L — AB (ref 0.30–3.00)
LEAD/CREAT. RATIO: 0 ug/g{creat} (ref 0–49)
Lead, 24H Ur: 0 ug/24 hr (ref 0–80)
Lead, Rand Ur: 1 ug/L (ref 0–49)
Mercury, Ur: NOT DETECTED ug/L (ref 0–19)

## 2014-11-01 ENCOUNTER — Ambulatory Visit (HOSPITAL_COMMUNITY): Payer: Self-pay | Admitting: Clinical

## 2014-11-07 ENCOUNTER — Ambulatory Visit (INDEPENDENT_AMBULATORY_CARE_PROVIDER_SITE_OTHER): Payer: Medicare Other | Admitting: Podiatry

## 2014-11-07 ENCOUNTER — Encounter: Payer: Self-pay | Admitting: Podiatry

## 2014-11-07 VITALS — BP 140/88 | HR 87 | Resp 18

## 2014-11-07 DIAGNOSIS — M2141 Flat foot [pes planus] (acquired), right foot: Secondary | ICD-10-CM | POA: Diagnosis not present

## 2014-11-07 DIAGNOSIS — M76829 Posterior tibial tendinitis, unspecified leg: Secondary | ICD-10-CM

## 2014-11-07 DIAGNOSIS — M2142 Flat foot [pes planus] (acquired), left foot: Secondary | ICD-10-CM

## 2014-11-07 DIAGNOSIS — M79676 Pain in unspecified toe(s): Secondary | ICD-10-CM | POA: Diagnosis not present

## 2014-11-07 DIAGNOSIS — M6789 Other specified disorders of synovium and tendon, multiple sites: Secondary | ICD-10-CM | POA: Diagnosis not present

## 2014-11-07 DIAGNOSIS — B351 Tinea unguium: Secondary | ICD-10-CM | POA: Diagnosis not present

## 2014-11-07 DIAGNOSIS — M19079 Primary osteoarthritis, unspecified ankle and foot: Secondary | ICD-10-CM | POA: Diagnosis not present

## 2014-11-07 NOTE — Progress Notes (Signed)
Patient ID: Lindsay [name], female   DOB: 04/03/1970, 44 y.o.   MRN: [id number]  Subjective: 44 year old female presents the office today for follow-up evaluation of bilateral foot pain. She states that she continues have pain to both of her feet without much change. She did go to [employer] and she was told the inserts will not covered however they would cover AFOs. She also gets numbness and tingling to her feet. She was recently placed on gabapentin 400 mg 3 times a day. She states that she was at a higher dose previously but was decreased by her behavioral health specialist. She does not feel that the gabapentin is helping at this dosage. She gets cramping to her legs when walking at times but not on a consistent basis. She denies any swelling or redness to her feet. No other complaints at this time in no acute changes.  Objective: AAO 3, NAD DP/PT pulses palpable, CRT less than 3 seconds Protective sensation mildly decreased with Simms Weinstein monofilament; decreased vibratory sensation.  Upon weightbearing there is a decrease in medial arch height upon weightbearing bilaterally. There is decreased range of motion of the subtalar joint on the right or the left and there is crepitation with range of motion on the right side. Ankle joint range of motion is intact. There is mild discomfort along the dorsal medial aspect of the foot on the talonavicular joint. MTPJ range of motion is intact. There is continuation of mild discomfort along the course of the posterior tibial tendon on the insertion into the navicular tuberosity.  There is slight discomfort about fashion along the fourth and fifth digits in the left foot. There are  no other areas of tenderness to bilateral lower extremities. There is no overlying edema, erythema, increase in warmth to bilateral lower extremities.  No open lesions or pre-ulcerative lesions. No pain with calf compression, swelling, warmth, erythema.  Assessment: 44 year old  female with symptomatic flatfoot deformity, neuropathy   Plan: -Treatment options discussed including all alternatives, risks, and complications -As she was unable to get the orthotics, rx for AFO was given -Also discussed with her possibly increase her gabapentin or change and to Lyrica. Also discussed possible neurology evaluation. As it seems to be some discrepancy between her primary care physician and her behavioral health doctor in regards to the good dosage will refer to her primary care physician for this. -Continue supportive shoe gear. -Follow-up after AFO or sooner if any palms are to arise. In the meantime call the office with any questions, concerns, change in symptoms.  Celesta Gentile, DPM

## 2014-11-08 ENCOUNTER — Telehealth: Payer: Self-pay | Admitting: *Deleted

## 2014-11-08 ENCOUNTER — Ambulatory Visit (HOSPITAL_COMMUNITY): Payer: Self-pay | Admitting: Psychiatry

## 2014-11-08 DIAGNOSIS — R0989 Other specified symptoms and signs involving the circulatory and respiratory systems: Secondary | ICD-10-CM

## 2014-11-08 NOTE — Telephone Encounter (Signed)
Informed pt of Dr. Leigh Aurora orders for B/L arterial dopplers and location of testing.  Orders faxed to [facility] [phone].

## 2014-11-12 ENCOUNTER — Other Ambulatory Visit: Payer: Self-pay | Admitting: Internal Medicine

## 2014-11-18 ENCOUNTER — Inpatient Hospital Stay (HOSPITAL_COMMUNITY): Admission: RE | Admit: 2014-11-18 | Payer: Self-pay | Source: Ambulatory Visit

## 2014-11-25 ENCOUNTER — Other Ambulatory Visit (HOSPITAL_COMMUNITY): Payer: Self-pay | Admitting: Psychiatry

## 2014-11-29 ENCOUNTER — Ambulatory Visit: Payer: Self-pay | Admitting: Internal Medicine

## 2014-11-29 ENCOUNTER — Other Ambulatory Visit (HOSPITAL_COMMUNITY): Payer: Self-pay | Admitting: Psychiatry

## 2014-11-29 DIAGNOSIS — F331 Major depressive disorder, recurrent, moderate: Secondary | ICD-10-CM

## 2014-11-29 MED ORDER — BUPROPION HCL ER (XL) 300 MG PO TB24: ORAL_TABLET | ORAL | Status: DC

## 2014-11-30 ENCOUNTER — Ambulatory Visit (INDEPENDENT_AMBULATORY_CARE_PROVIDER_SITE_OTHER): Payer: Medicare Other | Admitting: Internal Medicine

## 2014-11-30 ENCOUNTER — Encounter: Payer: Self-pay | Admitting: Internal Medicine

## 2014-11-30 ENCOUNTER — Ambulatory Visit (HOSPITAL_BASED_OUTPATIENT_CLINIC_OR_DEPARTMENT_OTHER): Payer: Medicare Other | Attending: Student in an Organized Health Care Education/Training Program | Admitting: Radiology

## 2014-11-30 VITALS — BP 142/79 | HR 97 | Temp 98.3°F | Wt 327.7 lb

## 2014-11-30 DIAGNOSIS — L732 Hidradenitis suppurativa: Secondary | ICD-10-CM

## 2014-11-30 DIAGNOSIS — M7989 Other specified soft tissue disorders: Secondary | ICD-10-CM | POA: Diagnosis not present

## 2014-11-30 DIAGNOSIS — R519 Headache, unspecified: Secondary | ICD-10-CM

## 2014-11-30 DIAGNOSIS — G4719 Other hypersomnia: Secondary | ICD-10-CM | POA: Insufficient documentation

## 2014-11-30 DIAGNOSIS — R600 Localized edema: Secondary | ICD-10-CM

## 2014-11-30 DIAGNOSIS — R5383 Other fatigue: Secondary | ICD-10-CM | POA: Insufficient documentation

## 2014-11-30 DIAGNOSIS — Z1239 Encounter for other screening for malignant neoplasm of breast: Secondary | ICD-10-CM

## 2014-11-30 DIAGNOSIS — R51 Headache: Secondary | ICD-10-CM | POA: Insufficient documentation

## 2014-11-30 DIAGNOSIS — R0683 Snoring: Secondary | ICD-10-CM | POA: Diagnosis not present

## 2014-11-30 DIAGNOSIS — N912 Amenorrhea, unspecified: Secondary | ICD-10-CM

## 2014-11-30 MED ORDER — GABAPENTIN 400 MG PO CAPS: 800.0000 mg | ORAL_CAPSULE | Freq: Three times a day (TID) | ORAL | Status: DC

## 2014-11-30 MED ORDER — BENZONATATE 100 MG PO CAPS: 100.0000 mg | ORAL_CAPSULE | Freq: Three times a day (TID) | ORAL | Status: DC | PRN

## 2014-11-30 MED ORDER — GABAPENTIN 800 MG PO TABS: 800.0000 mg | ORAL_TABLET | Freq: Three times a day (TID) | ORAL | Status: DC

## 2014-11-30 MED ORDER — NAPROXEN SODIUM 550 MG PO TABS: 550.0000 mg | ORAL_TABLET | Freq: Two times a day (BID) | ORAL | Status: DC

## 2014-11-30 MED ORDER — CLINDAMYCIN PHOSPHATE 1 % EX SOLN: CUTANEOUS | Status: DC

## 2014-11-30 NOTE — Progress Notes (Signed)
   Subjective:    Patient ID: Lindsay [name], female    DOB: 08-Dec-1970, 44 y.o.   MRN: [id number]  HPI [name] is a 44 y.o. who presents to [facility] today for routine follow-up. Her only new issue is URI like symptoms for the past 5 days, with productive cough, sore throat, subjective fevers and malaise, and flank pain from coughing. Please see problem-based charting for further pertinent information.   Review of Systems  Constitutional: Positive for fever. Negative for chills and diaphoresis.  HENT: Positive for sore throat. Negative for ear pain and sinus pressure.   Eyes: Negative for visual disturbance.  Respiratory: Positive for cough. Negative for shortness of breath and wheezing.   Cardiovascular: Negative for chest pain, palpitations and leg swelling.  Gastrointestinal: Negative for nausea, vomiting, abdominal pain, diarrhea, constipation and blood in stool.  Endocrine: Negative for polyuria.  Genitourinary: Negative for dysuria, urgency, frequency, flank pain, decreased urine volume and difficulty urinating.  Musculoskeletal: Positive for back pain. Negative for gait problem.  Skin: Negative for rash.  Neurological: Positive for numbness. Negative for dizziness, syncope, weakness and headaches.  Hematological: Does not bruise/bleed easily.  Psychiatric/Behavioral: Negative for confusion and agitation.  All other systems reviewed and are negative.      Objective:   Physical Exam  Constitutional: She is oriented to person, place, and time. She appears well-nourished. No distress.  HENT:  Head: Normocephalic and atraumatic.  Right Ear: External ear normal.  Left Ear: External ear normal.  Eyes: Conjunctivae are normal. Pupils are equal, round, and reactive to light.  Neck: Normal range of motion. Neck supple.  Cardiovascular: Normal rate, regular rhythm, normal heart sounds and intact distal pulses.  Exam reveals no gallop and no friction rub.   No murmur  heard. Pulmonary/Chest: Effort normal and breath sounds normal. She has no wheezes. She has no rales.  Abdominal: Soft. Bowel sounds are normal. She exhibits no distension. There is no tenderness.  Musculoskeletal: Normal range of motion. She exhibits edema. She exhibits no tenderness.  Patient has L foot brace from metatarsal fracture. Only mild swelling up to midcalf bilaterally, left worse than right but improved from previous exams. Left is pitting, right is non-pitting. Strength 5/5 in all extremities bilaterally, normal RO  Neurological: She is alert and oriented to person, place, and time. She has normal reflexes. No cranial nerve deficit. Coordination normal.  There is decreased sensation to light touch symmetrically in the distal phalanges. Normal sensation in the palmar and dorsal aspects of the hands. Decreased sensation in the toes bilaterally, symmetric. No other neurologic deficits appreciated on exam.   Skin: Skin is warm. No rash noted. She is not diaphoretic. No erythema.  Psychiatric: She has a normal mood and affect. Her behavior is normal.  Nursing note and vitals reviewed.         Assessment & Plan:  Please see problem-based charting for assessment and plan.  Blane Ohara, MD Resident Physician, PGY-1 Department of Internal Medicine [facility]

## 2014-11-30 NOTE — Assessment & Plan Note (Signed)
Pt says L > R LE swelling is mildly improved with compression stocking use. However, achy pain in bilateral knees and shooting pain/numbess in both feet is still persistent. Previous labs are essentially normal, even going as far as IFE, heavy metal screen, vitamin levels. While patient does not have classic symptoms of claudication, vascular study is important to consider. -Increased gabapentin to 800mg  TID, which was her previous dose for upper extremity neuropathy in 2012, but decreased more recently to evaluate causes of her weight gain - weight has not changed at decreased doses so we are comfortable increasing again to previous dose -Continue tramadol -Follow-up vascular studies -F/u in 3 months

## 2014-11-30 NOTE — Assessment & Plan Note (Signed)
Patient w h/o hidradenitis s/p R axillary excision. Now says she has in her genital/groin area. Has tried her daughter's topical clindamycin which has helped significantly. Likely Hurley stage I. -Start clinda ointment -If does not improve, will need to better stage and consider oral abx/steroids.

## 2014-11-30 NOTE — Patient Instructions (Signed)
Mrs Banales,  Please increase your gabapentin to 800mg  three times per day. Take the Tessalon for your cough and the naproxen for your side pain. Let us know if these things are working. Make sure to get the circulation studies and the sleep study.  Thanks!  Blane Ohara

## 2014-12-01 NOTE — Progress Notes (Signed)
Internal Medicine Clinic Attending  I saw and evaluated the patient.  I personally confirmed the key portions of the history and exam documented by Dr. Kennedy and I reviewed pertinent patient test results.  The assessment, diagnosis, and plan were formulated together and I agree with the documentation in the resident's note.  

## 2014-12-04 ENCOUNTER — Other Ambulatory Visit (HOSPITAL_COMMUNITY): Payer: Self-pay | Admitting: Psychiatry

## 2014-12-05 ENCOUNTER — Other Ambulatory Visit (HOSPITAL_COMMUNITY): Payer: Self-pay | Admitting: Psychiatry

## 2014-12-05 ENCOUNTER — Other Ambulatory Visit: Payer: Self-pay | Admitting: Internal Medicine

## 2014-12-05 NOTE — Telephone Encounter (Signed)
rx phoned in

## 2014-12-10 DIAGNOSIS — R51 Headache: Secondary | ICD-10-CM | POA: Diagnosis not present

## 2014-12-10 NOTE — Progress Notes (Signed)
  Patient Name: Lindsay [name], Lindsay [name] Date: 11/30/2014 Gender: Female D.O.B: 07/03/1970 Age (years): 43 Referring Provider: Axel Filler Height (inches): 64 Interpreting Physician: Baird Lyons MD, ABSM Weight (lbs): 327 RPSGT: Carolin Coy BMI: 39 MRN: [id number] Neck Size: 16.00 CLINICAL INFORMATION Sleep Study Type: NPSG Indication for sleep study: Excessive Daytime Sleepiness, Fatigue, Morning Headaches, Snoring Epworth Sleepiness Score: 10 SLEEP STUDY TECHNIQUE As per the AASM Manual for the Scoring of Sleep and Associated Events v2.3 (April 2016) with a hypopnea requiring 4% desaturations. The channels recorded and monitored were frontal, central and occipital EEG, electrooculogram (EOG), submentalis EMG (chin), nasal and oral airflow, thoracic and abdominal wall motion, anterior tibialis EMG, snore microphone, electrocardiogram, and pulse oximetry.  MEDICATIONS Patient's medications include: charted for review. Medications self-administered by patient during sleep study : No sleep medicine administered.  SLEEP ARCHITECTURE The study was initiated at 10:23:26 PM and ended at 4:51:05 AM. Sleep onset time was 46.6 minutes and the sleep efficiency was 83.2%. The total sleep time was 322.5 minutes. Stage REM latency was 162.0 minutes. The patient spent 6.36% of the night in stage N1 sleep, 75.04% in stage N2 sleep, 0.00% in stage N3 and 18.60% in REM. Alpha intrusion was absent. Supine sleep was 74.47%.  RESPIRATORY PARAMETERS The overall apnea/hypopnea index (AHI) was 3.5 per hour. There were 12 total apneas, including 12 obstructive, 0 central and 0 mixed apneas. There were 7 hypopneas and 5 RERAs. The AHI during Stage REM sleep was 19.0 per hour. AHI while supine was 4.5 per hour. The mean oxygen saturation was 92.95%. The minimum SpO2 during sleep was 80.00%. Moderate snoring was noted during this study.  CARDIAC DATA The 2 lead EKG demonstrated sinus  rhythm. The mean heart rate was 74.77 beats per minute. Other EKG findings include: None.  LEG MOVEMENT DATA The total PLMS were 0 with a resulting PLMS index of 0.00. Associated arousal with leg movement index was 0.0 .  IMPRESSIONS - No significant obstructive sleep apnea occurred during this study (AHI = 3.5/h). - No significant central sleep apnea occurred during this study (CAI = 0.0/h). - Moderate oxygen desaturation was noted during this study (Min O2 = 80.00%). - The patient snored with Moderate snoring volume. - No cardiac abnormalities were noted during this study. - Clinically significant periodic limb movements did not occur during sleep. No significant associated arousals.  DIAGNOSIS - Primary Snoring (786.09 [R06.83 ICD-10]) - Normal study  RECOMMENDATIONS - Avoid alcohol, sedatives and other CNS depressants that may worsen sleep apnea and disrupt normal sleep architecture. - Sleep hygiene should be reviewed to assess factors that may improve sleep quality. - Weight management and regular exercise should be initiated or continued if appropriate.  [place], American Board of Sleep Medicine  ELECTRONICALLY SIGNED ON:  12/10/2014, 11:14 AM [place] PH: [phone]   FX: [phone] [place]

## 2014-12-15 ENCOUNTER — Encounter (HOSPITAL_COMMUNITY): Payer: Self-pay | Admitting: Clinical

## 2014-12-15 ENCOUNTER — Ambulatory Visit (INDEPENDENT_AMBULATORY_CARE_PROVIDER_SITE_OTHER): Payer: Medicare Other | Admitting: Clinical

## 2014-12-15 DIAGNOSIS — F411 Generalized anxiety disorder: Secondary | ICD-10-CM | POA: Diagnosis not present

## 2014-12-15 DIAGNOSIS — F331 Major depressive disorder, recurrent, moderate: Secondary | ICD-10-CM | POA: Diagnosis not present

## 2014-12-15 DIAGNOSIS — F410 Panic disorder [episodic paroxysmal anxiety] without agoraphobia: Secondary | ICD-10-CM | POA: Diagnosis not present

## 2014-12-17 IMAGING — US US EXTREM LOW*L* LIMITED
1 series · 14 of 25 positions shown · non-contrast
Comparison: No recent prior.

CLINICAL DATA: Leg pain.

EXAM:
ULTRASOUND LEFT LOWER EXTREMITY LIMITED
TECHNIQUE: Ultrasound examination of the lower extremity soft tissues was
performed in the area of clinical concern.

[Series 1: us extrem low*left* limited · 0.06mm/px · 33 acquisitions, 14 frames shown]
[im 1/33]
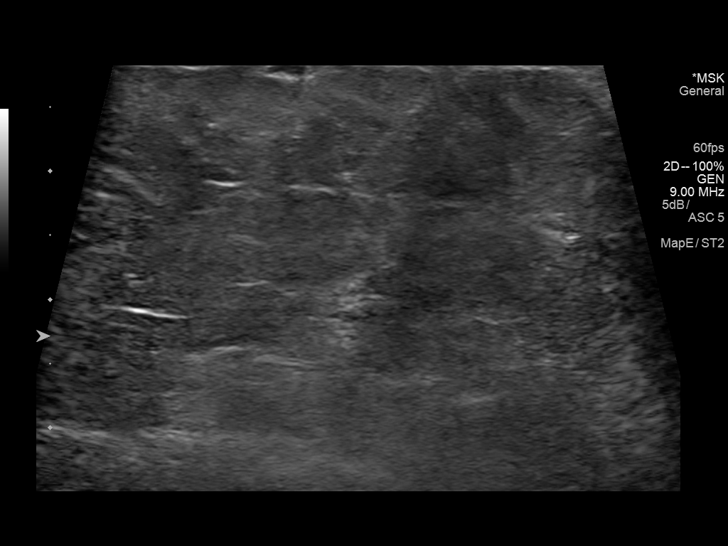
[im 3/33]
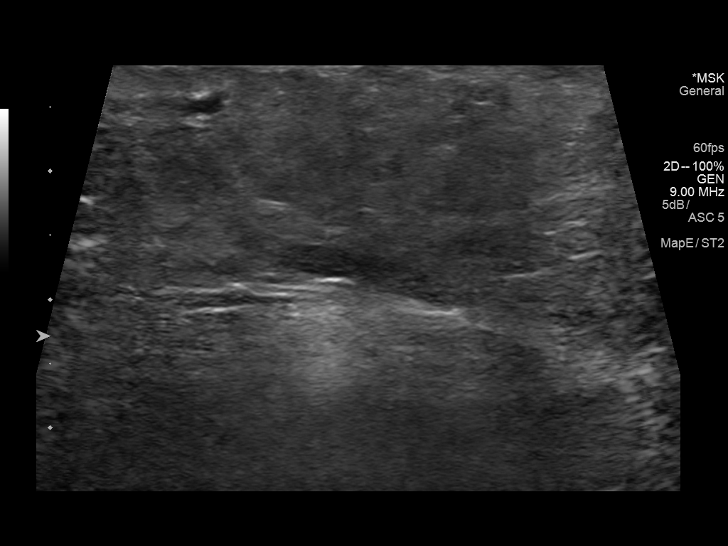
[im 6/33]
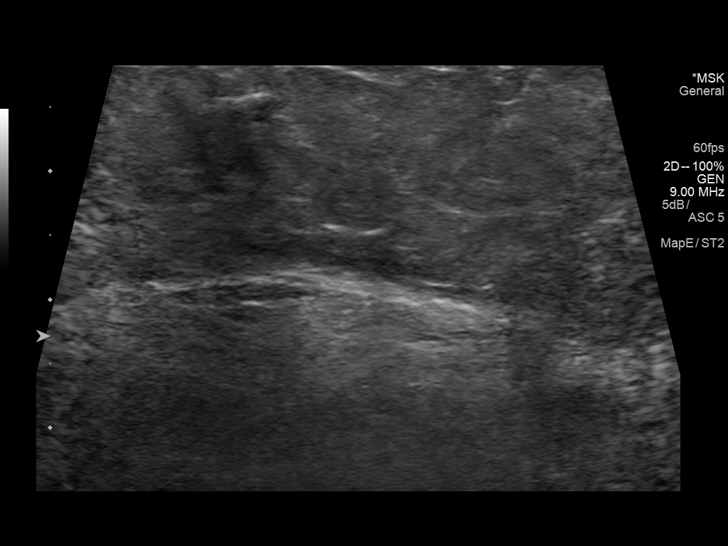
[im 9/33]
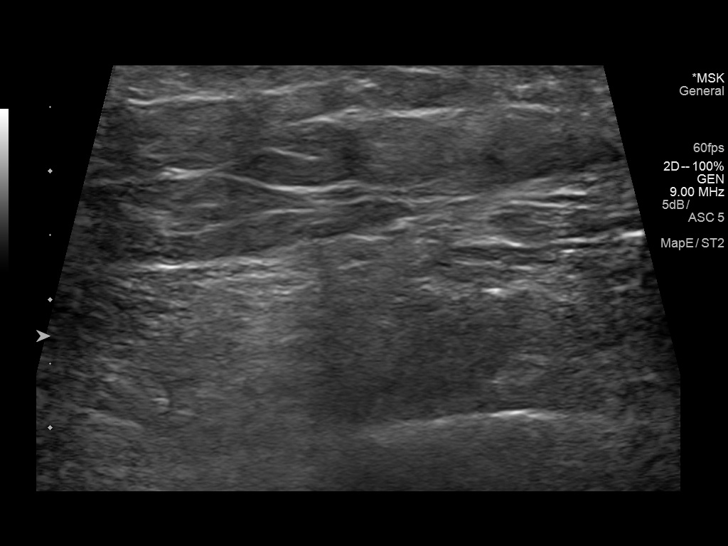
[im 11/33]
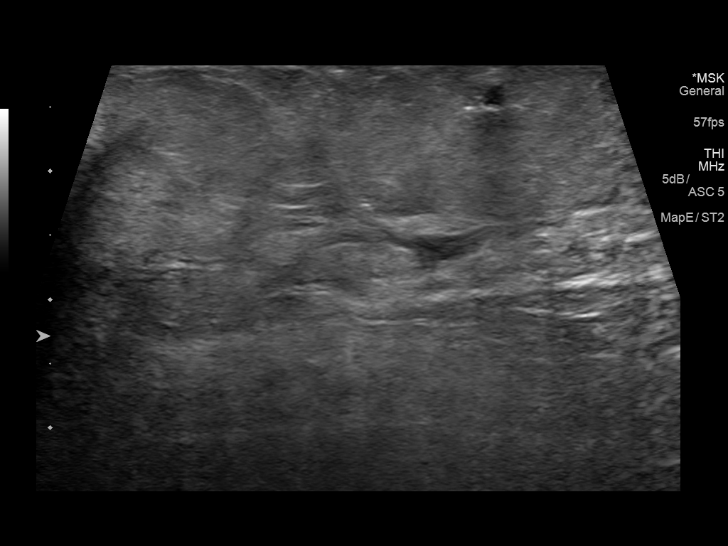
[im 13/33]
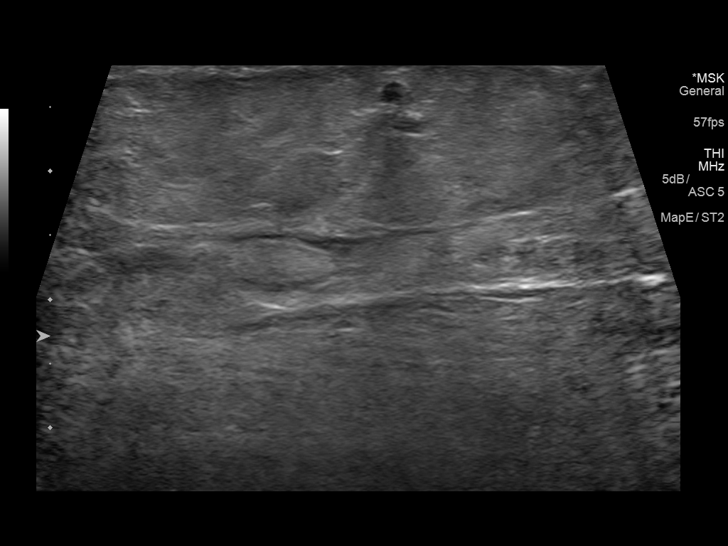
[im 15/33]
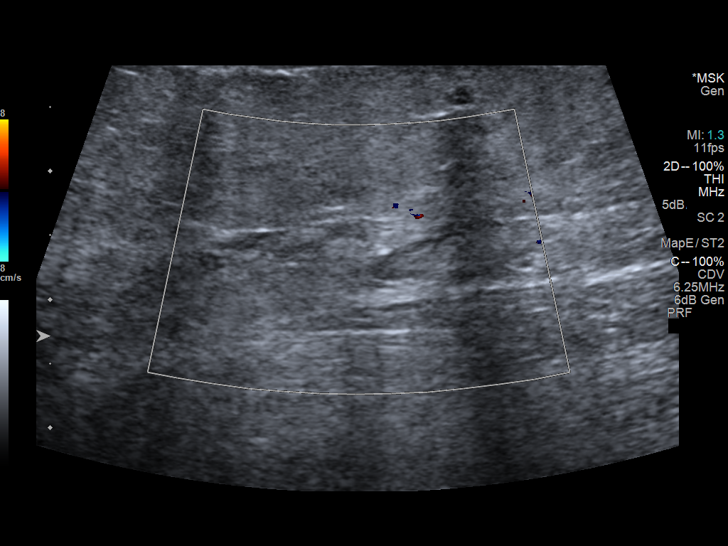
[im 18/33]
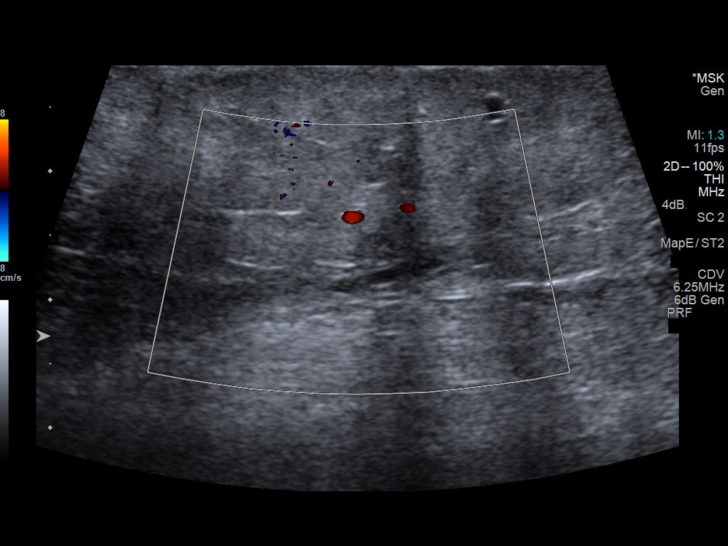
[im 21/33]
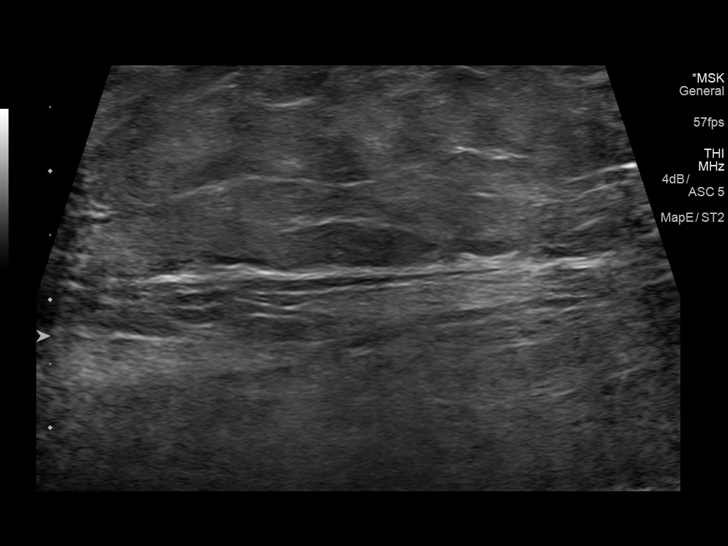
[im 22/33]
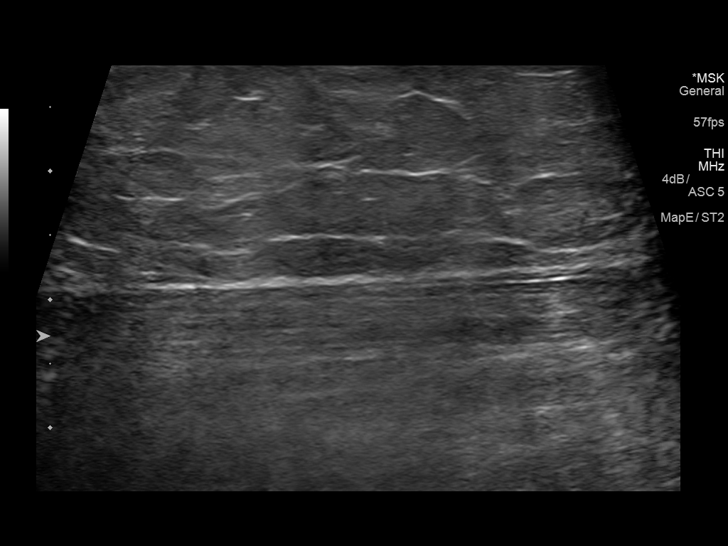
[im 25/33]
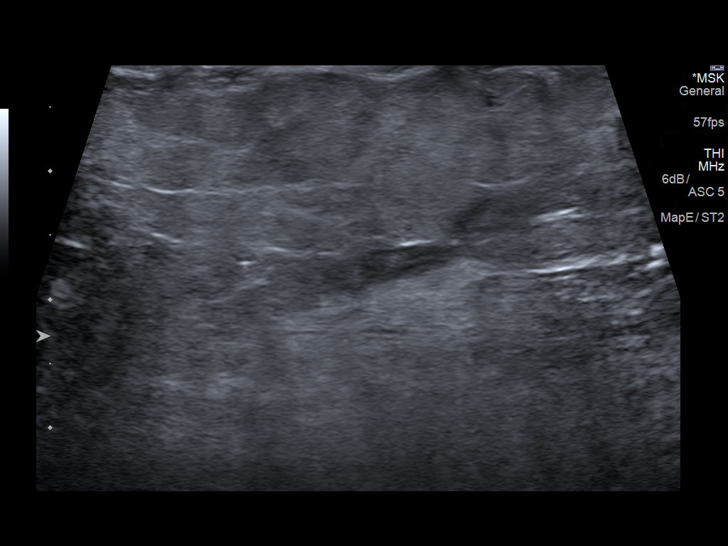
[im 27/33]
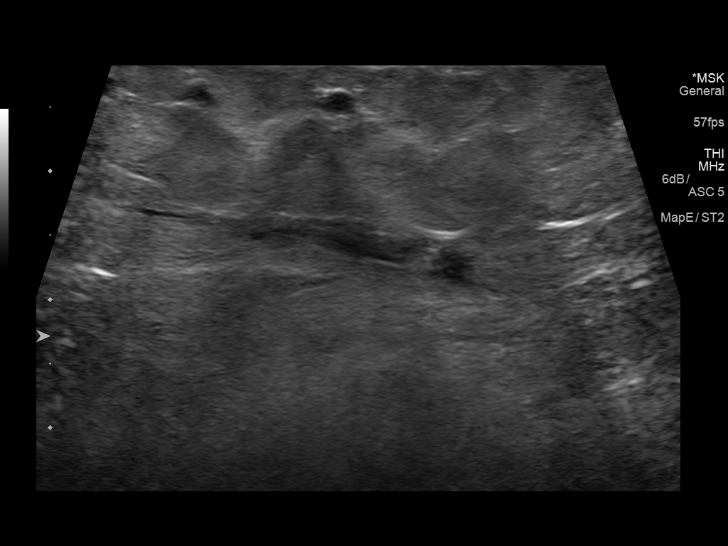
[im 30/33]
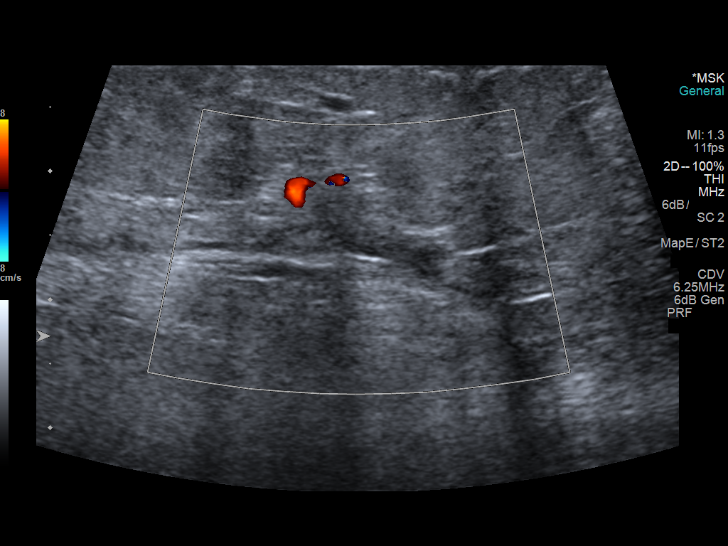
[im 33/33]
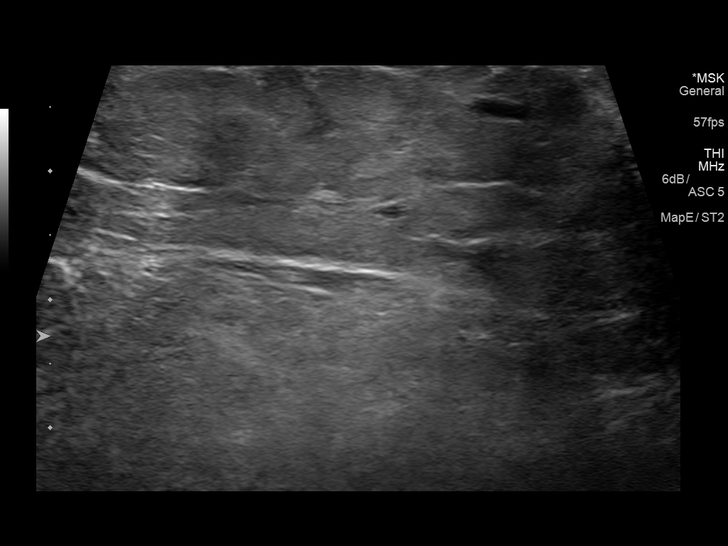

[14 of 25 positions shown; findings below may reference images not displayed]

FINDINGS: Small linear inter or intramuscular fluid collection versus edema
noted. No solid mass lesion noted. Further evaluation with
gadolinium-enhanced MRI of the lower extremity can be obtained .
IMPRESSION: Small linear inter or intramuscular fluid collection noted in the
left calf region. No solid mass lesion noted. Further evaluation
with gadolinium-enhanced MRI of the left lower extremity can be
obtained .

## 2014-12-20 ENCOUNTER — Ambulatory Visit (HOSPITAL_COMMUNITY): Payer: Self-pay | Admitting: Psychiatry

## 2014-12-20 ENCOUNTER — Other Ambulatory Visit: Payer: Self-pay | Admitting: Podiatry

## 2014-12-20 DIAGNOSIS — R0989 Other specified symptoms and signs involving the circulatory and respiratory systems: Secondary | ICD-10-CM

## 2014-12-25 NOTE — Progress Notes (Addendum)
   THERAPIST PROGRESS NOTE  Session Time: 9:00 -9:57  Participation Level: Active  Behavioral Response: CasualAlertDepressed  Type of Therapy: Individual Therapy  Treatment Goals addressed: improve psychiatric symptoms, Calming skills  Interventions: Motivational Interviewing  Summary: Lindsay [name] is a 44 y.o. female who presents with Major depressive disorder, recurrent episode, moderate, and generalized anxiety disorder.   Suicidal/Homicidal: Nowithout intent/plan  Therapist Response: [name] met with clinician for an individual session. [name] discussed her psychiatric symptoms her current life events, and her homework. [name] shared that she had been practicing her grounding and mindfulness techniques some but not everyday. Clinician encouraged her to continue to practice and to practice more frequently. [name] shared that from October until now she knew 24 young men who were either killed or had killed another young person. 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 She shared her daughter was grieving too. She shared this has caused her to have more trouble sleeping, feeling sad, and not wanting to look in the mirror. Client and clinician discussed the grieving process and support of others. [name] shared about her support system and her current stage of grief. [name] shared  [name] shared they were moving their stuff back into her apartment. She  She that they stayed there but that all their stuff had to be moved into storage while the landlord fixed issues with mold. She shared she was unsure if it was properly fixed because she has been experiencing a cough a lot lately. She stated that she is behind on her rent now because the building was not in compliance, her funding could not be applied and the land lord still charged the same rent. She shared she lives with her mother.She shared she would like to  move but cannot due to finances. She stated she had a sleep study done but it waiting on the results. She believes that if she sleeps better she will not have as much depression.  Client and clinician discussed some basic cbt concepts. Client and clinician discussed her thoughts about what is in her power to change and what is not. [name] agreed to complete some cbt homework as well as continue her grounding and mindfulness techniques until next session.    Plan: Return again in 1-2 weeks.  Diagnosis:Axis I: Major depressive disorder, recurrent episode, moderate, and generalized anxiety disorder.         Justino Boze A, LCSW 12/25/2014

## 2014-12-29 ENCOUNTER — Other Ambulatory Visit: Payer: Self-pay | Admitting: Internal Medicine

## 2014-12-29 ENCOUNTER — Other Ambulatory Visit (HOSPITAL_COMMUNITY): Payer: Self-pay | Admitting: Psychiatry

## 2015-01-02 NOTE — Telephone Encounter (Signed)
Met with Dr. Adele Schilder who approved new orders for patient's Rexulti, Wellbutrin XL and Tranxene.  Called in Tranxene order with Barnabas Lister, pharmacist at [facility] on [facility] for Tranxene $RemoveBefo'15mg'[id number]$ , one each morning, #30 plus one refill and e-scribed in new Rexulti and Wellbutrin XL orders plus one refill as ordered by Dr. Adele Schilder.  Called patient to inform orders were sent to her pharmacy as she had left a message with request.

## 2015-01-09 ENCOUNTER — Ambulatory Visit (HOSPITAL_COMMUNITY)
Admission: RE | Admit: 2015-01-09 | Discharge: 2015-01-09 | Disposition: A | Discharge status: Home / Self Care | Payer: Medicare Other | Source: Ambulatory Visit | Attending: Cardiology | Admitting: Cardiology

## 2015-01-09 DIAGNOSIS — R0989 Other specified symptoms and signs involving the circulatory and respiratory systems: Secondary | ICD-10-CM

## 2015-01-09 DIAGNOSIS — N189 Chronic kidney disease, unspecified: Secondary | ICD-10-CM | POA: Diagnosis not present

## 2015-01-09 DIAGNOSIS — I129 Hypertensive chronic kidney disease with stage 1 through stage 4 chronic kidney disease, or unspecified chronic kidney disease: Secondary | ICD-10-CM | POA: Diagnosis not present

## 2015-01-16 ENCOUNTER — Telehealth: Payer: Self-pay | Admitting: *Deleted

## 2015-01-16 NOTE — Telephone Encounter (Addendum)
-----   Message from Trula Slade, DPM sent at 01/16/2015  7:55 AM EST ----- Please let her know arterial studies were normal.  Informed pt of results.  Pt states she's not certain if the AFO braces will be approved but the feet are hurting worse.  I offered pt an appt and she said she will call next week.

## 2015-01-17 ENCOUNTER — Other Ambulatory Visit: Payer: Self-pay | Admitting: Internal Medicine

## 2015-02-07 ENCOUNTER — Other Ambulatory Visit: Payer: Self-pay | Admitting: Internal Medicine

## 2015-02-08 ENCOUNTER — Ambulatory Visit (HOSPITAL_COMMUNITY): Payer: Self-pay | Admitting: Psychiatry

## 2015-02-21 ENCOUNTER — Encounter (HOSPITAL_COMMUNITY): Payer: Self-pay | Admitting: Psychiatry

## 2015-02-21 ENCOUNTER — Ambulatory Visit (INDEPENDENT_AMBULATORY_CARE_PROVIDER_SITE_OTHER): Payer: Medicare Other | Admitting: Psychiatry

## 2015-02-21 VITALS — BP 136/84 | HR 86 | Ht 71.0 in | Wt 343.0 lb

## 2015-02-21 DIAGNOSIS — F331 Major depressive disorder, recurrent, moderate: Secondary | ICD-10-CM

## 2015-02-21 MED ORDER — CLORAZEPATE DIPOTASSIUM 15 MG PO TABS: ORAL_TABLET | ORAL | Status: DC

## 2015-02-21 MED ORDER — HYDROXYZINE PAMOATE 25 MG PO CAPS: 25.0000 mg | ORAL_CAPSULE | Freq: Every day | ORAL | Status: DC | PRN

## 2015-02-21 MED ORDER — BREXPIPRAZOLE 1 MG PO TABS: 1.0000 | ORAL_TABLET | Freq: Every day | ORAL | Status: DC

## 2015-02-21 MED ORDER — BUPROPION HCL ER (XL) 300 MG PO TB24: ORAL_TABLET | ORAL | Status: DC
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
## 2015-03-09 ENCOUNTER — Other Ambulatory Visit: Payer: Self-pay | Admitting: Internal Medicine

## 2015-03-10 ENCOUNTER — Telehealth: Payer: Self-pay | Admitting: Internal Medicine

## 2015-03-10 MED ORDER — PROMETHAZINE HCL 25 MG PO TABS: 25.0000 mg | ORAL_TABLET | Freq: Four times a day (QID) | ORAL | Status: DC | PRN

## 2015-03-10 MED ORDER — GABAPENTIN 800 MG PO TABS: 800.0000 mg | ORAL_TABLET | Freq: Three times a day (TID) | ORAL | Status: DC

## 2015-03-10 NOTE — Telephone Encounter (Signed)
Patient requesting a refill on her gabapentin and also that the patient states the quantity and has been without it for 4 days and . The Patient states she has been without her Phenergan for 7 days she took her last one on Saturday.  She would like a call back today.

## 2015-03-10 NOTE — Telephone Encounter (Signed)
I filled the gabapenetin  She is on a boat load of phenergan. She got #42 on 11/7, 12/1, 12/20, and 1/10. That equals 168 for 3 months or 56 per 30 days, so basically 2 per day each and every day. 2016 note indicated she used this for HA. IF this is true, we need to work on better HA control. She has appt [facility] next week - she must keep this appt or jeopardize refills.

## 2015-03-10 NOTE — Telephone Encounter (Signed)
Request is pending to her PCP as of yesterday afternoon, we have updated the quantity on the gabapentin to make it a months supply per her orders.  Spoke with patient to update her, she is still very nauseated and can't function well without the phenergan, will task to Dr. Merrilyn Puma as urgent since pt has been out and tomorrow starts the weekend.

## 2015-03-14 NOTE — Telephone Encounter (Signed)
Called pt, reminded her of appt, she is agreeable

## 2015-03-16 ENCOUNTER — Ambulatory Visit (INDEPENDENT_AMBULATORY_CARE_PROVIDER_SITE_OTHER): Payer: Medicare Other | Admitting: Internal Medicine

## 2015-03-16 ENCOUNTER — Encounter: Payer: Self-pay | Admitting: Internal Medicine

## 2015-03-16 VITALS — BP 151/95 | HR 98 | Temp 98.6°F | Ht 71.0 in | Wt 347.7 lb

## 2015-03-16 DIAGNOSIS — M797 Fibromyalgia: Secondary | ICD-10-CM

## 2015-03-16 DIAGNOSIS — K909 Intestinal malabsorption, unspecified: Secondary | ICD-10-CM

## 2015-03-16 DIAGNOSIS — M7989 Other specified soft tissue disorders: Secondary | ICD-10-CM | POA: Diagnosis not present

## 2015-03-16 DIAGNOSIS — R1084 Generalized abdominal pain: Secondary | ICD-10-CM

## 2015-03-16 DIAGNOSIS — G44001 Cluster headache syndrome, unspecified, intractable: Secondary | ICD-10-CM

## 2015-03-16 MED ORDER — OMEPRAZOLE 40 MG PO CPDR: 40.0000 mg | DELAYED_RELEASE_CAPSULE | Freq: Every day | ORAL | Status: DC

## 2015-03-16 MED ORDER — KETOROLAC TROMETHAMINE 30 MG/ML IJ SOLN
60.0000 mg | Freq: Once | INTRAMUSCULAR | Status: AC
  Administered 2015-03-16: 60 mg via INTRAMUSCULAR

## 2015-03-16 MED ORDER — GABAPENTIN 600 MG PO TABS: 300.0000 mg | ORAL_TABLET | Freq: Every day | ORAL | Status: DC

## 2015-03-16 NOTE — Progress Notes (Signed)
   Patient ID: Lindsay [name] female   DOB: 1970-08-11 45 y.o.   MRN: [id number]  Subjective:   HPI: [name] is a 45 y.o. with PMH of morbid obesity, fibromyalgia, anxiety/depression, chronic headaches, and chronic bilateral lower extremity edema and neuropathy who presents to [facility] today for routine follow-up. She says recently, her chronic tension headache has worsened x 1 week without any red flag symptoms, unchanged in quality. Her only new complaint is that over the past 2 months, she has noticed that her bowel movements have been all 'undigested food' mixed with mucus and, on 2 occasions, scant bright red blood. She does have a history of chronic nausea and abdominal pain but denies any changes in these recently.  Please see problem-based charting for status of medical issues pertinent to this visit.  Review of Systems: Pertinent items noted in HPI and remainder of comprehensive ROS otherwise negative.  Objective:  Physical Exam: Filed Vitals:   03/16/15 1400  BP: 151/95  Pulse: 98  Temp: 98.6 F (37 C)  TempSrc: Oral  Height: 5\' 11"  (1.803 m)  Weight: 347 lb 11.2 oz (157.716 kg)  SpO2: 100%   Gen: Tired-appearing, alert and oriented to person, place, and time. Morbidly obese. HEENT: Oropharynx clear without erythema or exudate.  Neck: No cervical LAD, no thyromegaly or nodules, no JVD noted. CV: Normal rate, regular rhythm, no murmurs, rubs, or gallops Pulmonary: Normal effort, CTA bilaterally, no wheezing, rales, or rhonchi Abdominal: Soft, non-tender, non-distended, without rebound, guarding, or masses Extremities: Distal pulses 2+ in upper and lower extremities bilaterally, no tenderness, erythema or edema Skin: No atypical appearing moles. No rashes  Assessment & Plan:  Please see problem-based charting for assessment and plan.  Blane Ohara, MD Resident Physician, PGY-1 Department of Internal Medicine [facility]

## 2015-03-17 ENCOUNTER — Encounter (INDEPENDENT_AMBULATORY_CARE_PROVIDER_SITE_OTHER): Payer: Self-pay

## 2015-03-17 ENCOUNTER — Encounter: Payer: Self-pay | Admitting: Internal Medicine

## 2015-03-17 DIAGNOSIS — R109 Unspecified abdominal pain: Secondary | ICD-10-CM | POA: Insufficient documentation

## 2015-03-17 NOTE — Assessment & Plan Note (Signed)
Bilateral LE swelling is stable, if not slightly improved with compression stocking use and her pain/numbness is stable as well despite her titrating down to gabapentin 300mg  QD. Vascular studies were normal. -Continue tramadol -Continue gabapentin 300mg  QD

## 2015-03-17 NOTE — Assessment & Plan Note (Signed)
Chronic, intermittent nature previously followed by Dr. Ardis Hughs a few years' back. Says this is unchanged in nature recently, managed with phenergan PRN, but does have increased BMs of 'undigested food' x 2 months, mixed with mucus and very rarely, scant blood. Mentioned all this in passing at the end of our encounter. Would like to be seen again by Dr. Ardis Hughs. It appears that previous workup did not yield any causes of her abdominal pain (likely functional, related to depression/fibromyalgia) but new bowel symptoms are concerning for a malabsorptive process. -Refer to GI for evaluation of possible malabsorption syndrome

## 2015-03-17 NOTE — Assessment & Plan Note (Signed)
She continues to follow with HA clinic, says her typical tension-like headache has increased over the past week, which responds well to IM toradol. -Toradol 60mg  once -Follow-up with HA clinic, next appt next month -Continue PRN phenergan (1-2 times/day) for HA-induced nausea

## 2015-03-17 NOTE — Assessment & Plan Note (Addendum)
She continues to gain weight, more than 20 lbs in a 24-month span, now well over 100 pounds in the past 2 1/2 years, which she has attributed to [name] increasing her gabapentin back to previous doses (last visit increased to 800mg  TID) for her neuropathy. She tapered down to 300mg  QD of gabapentin herself, and has gained 4 lbs over the past 4 weeks (less than the first 2 months but still gaining). She says she 'doesn't eat' because of her chronic nausea for which she takes phenergan for 1-2 times/day. She is on brexipiprazole, which is much more likely to be the source of any iatrogenic weight gain than gapapentin, if medications are contributing. More likely, this is a combination of her dietary and exercise habits as well as depression. She continues to not exercise because of the strain on her back and getting short of breath. We have done an extensive workup for organic causes of this, but lab and imaging has been negative so far. Sleep study was normal. -Counseled on improved dietary habits -Wrote doctor's letter for [employer] - will call in 2 weeks to see if she has had any issues going -Consider further medication changes if still gaining weight

## 2015-03-17 NOTE — Progress Notes (Signed)
Internal Medicine Clinic Attending  Case discussed with Dr. Kennedy at the time of the visit.  We reviewed the resident's history and exam and pertinent patient test results.  I agree with the assessment, diagnosis, and plan of care documented in the resident's note.  

## 2015-03-21 ENCOUNTER — Encounter (HOSPITAL_COMMUNITY): Payer: Self-pay | Admitting: Clinical

## 2015-03-21 ENCOUNTER — Ambulatory Visit (INDEPENDENT_AMBULATORY_CARE_PROVIDER_SITE_OTHER): Payer: Medicare Other | Admitting: Clinical

## 2015-03-21 DIAGNOSIS — F411 Generalized anxiety disorder: Secondary | ICD-10-CM

## 2015-03-21 DIAGNOSIS — F331 Major depressive disorder, recurrent, moderate: Secondary | ICD-10-CM
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
## 2015-03-24 ENCOUNTER — Other Ambulatory Visit: Payer: Self-pay | Admitting: Internal Medicine

## 2015-03-30 ENCOUNTER — Other Ambulatory Visit (HOSPITAL_COMMUNITY): Payer: Self-pay | Admitting: Psychiatry

## 2015-03-30 DIAGNOSIS — F331 Major depressive disorder, recurrent, moderate: Secondary | ICD-10-CM

## 2015-03-30 NOTE — Telephone Encounter (Signed)
Met with Dr. Adele Schilder who approved a new one time order for patient's prescribed Hydroxyzine 25 mg, one capsule by mouth daily as needed for anxiety, #30 with no refills and order e-scribed to patient's [facility] on [address] as prescribed.

## 2015-04-04 ENCOUNTER — Encounter: Payer: Self-pay | Admitting: Gastroenterology

## 2015-04-04 ENCOUNTER — Ambulatory Visit (INDEPENDENT_AMBULATORY_CARE_PROVIDER_SITE_OTHER): Payer: Medicare Other | Admitting: Gastroenterology

## 2015-04-04 VITALS — BP 130/80 | HR 84 | Ht 71.0 in | Wt 354.4 lb

## 2015-04-04 DIAGNOSIS — R194 Change in bowel habit: Secondary | ICD-10-CM | POA: Diagnosis not present

## 2015-04-04 DIAGNOSIS — K625 Hemorrhage of anus and rectum: Secondary | ICD-10-CM

## 2015-04-04 NOTE — Patient Instructions (Addendum)
You have been given a separate informational sheet regarding your tobacco use, the importance of quitting and local resources to help you quit.  We will contact you about scheduling a colonoscopy once we hear back from the doctor.   Please start benefiber daily.

## 2015-04-06 ENCOUNTER — Encounter: Payer: Self-pay | Admitting: Gastroenterology

## 2015-04-06 DIAGNOSIS — R194 Change in bowel habit: Secondary | ICD-10-CM | POA: Insufficient documentation

## 2015-04-06 NOTE — Progress Notes (Signed)
04/04/2015 Lindsay [name] [id number] 11/14/1970   HISTORY OF PRESENT ILLNESS:  This is a 45 year old female previously known to Dr. Ardis Hughs.  Last seen here in 2012, suspected to have some functional bowel issues.  Had colonoscopy in 07/2007 with one polyp that was removed and was a hyperplastic polyp; also had hemorrhoids.  Her PMH includes bipolar/depression and anxiety, fibromyalgia, migraines, and hidradenitis.  Presents to the office today at the request of her PCP, Dr. Merrilyn Puma, with complaints of rectal bleeding and mucus in her stool.  Says that since November she's had two episodes of rectal bleeding with moderate amount of blood.  Each time bleeding only occurred with one stool before resolving.  Also passes mucus from her rectum as well.  Says that she moves her bowels once a day but sometimes they are loose and sometimes hard and feels constipated.  Says that she sees undigested food such as lettuce floating in her stool at times.  Also has bloating and diffuse abdominal discomfort on and off.  Has gained 100 pounds in the past 1.5 years, which is thought to possibly have been from the Abilify that she was on previously.  Does not take any medications for her bowels.  Says that she used to take a fiber supplement but hasn't taken that since 2010 or 2011.  Last labs were in September of 2016 with normal CBC, TSH, and CMP at that time.  Past Medical History  Diagnosis Date  . Depression with anxiety   . Hypertension   . Anorexia   . IBS (irritable bowel syndrome)   . Low back pain   . Edema leg   . Hemorrhoids   . Panic attacks   . Anemia     Iron Def  . Colon polyp 2009  . Migraines   . Colon polyps   . Neuromuscular disorder (HCC)     fibromyalgia  . Tonsil pain   . Polyp of vocal cord or larynx   . Hidradenitis suppurativa   . Depression   . Chronic kidney disease     Nephrotic syndrome   Past Surgical History  Procedure Laterality Date  . Hemorrhoid surgery      with  Hidradenitis surgery   . Upper gastrointestinal endoscopy    . Axillary hidradenitis excision    . Inguinal hidradenitis excision    . Tonsillectomy  10/18/2010    by Dr. Wilburn Cornelia    reports that she has been smoking Cigarettes.  She has a 12.5 pack-year smoking history. She has never used smokeless tobacco. She reports that she does not drink alcohol or use illicit drugs. family history includes Anxiety disorder in her mother and sister; Cancer in her father; Depression in her mother, sister, and sister; Diabetes in her mother; Heart disease in her father and mother; Learning disabilities in her sister. There is no history of Colon cancer. No Known Allergies    Outpatient Encounter Prescriptions as of 04/04/2015  Medication Sig  . BIOTIN PO Take 1 capsule by mouth daily.  . Brexpiprazole (REXULTI) 1 MG TABS Take 1 tablet by mouth daily.  [name] buPROPion (WELLBUTRIN XL) 300 MG 24 hr tablet TAKE 1 TABLET (300 MG TOTAL) BY MOUTH EVERY MORNING.  . cholecalciferol (VITAMIN D) 1000 units tablet Take 1,000 Units by mouth daily.  . clindamycin (CLEOCIN T) 1 % external solution Apply 1 application topically as needed. For hidradenitis flare ups  . clorazepate (TRANXENE) 15 MG tablet TAKE 1 TABLET BY  MOUTH EVERY DAY IN THE MORNING  . gabapentin (NEURONTIN) 600 MG tablet Take 0.5 tablets (300 mg total) by mouth daily.  . hydrOXYzine (VISTARIL) 25 MG capsule TAKE 1 CAPSULE (25 MG TOTAL) BY MOUTH DAILY AS NEEDED FOR ANXIETY.  . Multiple Vitamins-Minerals (MULTIVITAMIN PO) Take 1 capsule by mouth daily.  [name] omeprazole (PRILOSEC) 40 MG capsule Take 1 capsule (40 mg total) by mouth daily.  . promethazine (PHENERGAN) 25 MG tablet TAKE 1 TABLET (25 MG TOTAL) BY MOUTH EVERY 6 (SIX) HOURS AS NEEDED. FOR NAUSEA  . [DISCONTINUED] clindamycin (CLEOCIN-T) 1 % external solution Apply to affected area 2 times daily (Patient taking differently: as needed. Apply to affected area prn for hidradentis flares)   No  facility-administered encounter medications on file as of 04/04/2015.     REVIEW OF SYSTEMS  : All other systems reviewed and negative except where noted in the History of Present Illness.   PHYSICAL EXAM: BP 130/80 mmHg  Pulse 84  Ht 5\' 11"  (1.803 m)  Wt 354 lb 6.4 oz (160.755 kg)  BMI 49.45 kg/m2  LMP  General: Well developed black female in no acute distress Head: Normocephalic and atraumatic Eyes:  Sclerae anicteric, conjunctiva pink. Ears: Normal auditory acuity Lungs: Clear throughout to auscultation Heart: Regular rate and rhythm Abdomen: Soft, non-distended.  Obese.  Normal bowel sounds.  Non-tender. Rectal:  Will be done at the time of colonoscopy. Musculoskeletal: Symmetrical with no gross deformities  Skin: No lesions on visible extremities Extremities: No edema  Neurological: Alert oriented x 4, grossly non-focal Psychological:  Alert and cooperative. Normal mood and affect  ASSESSMENT AND PLAN: -45 year old AA female with alternating bowel habits of constipation and loose stools who has also had rectal bleeding and mucus with BM's.  Very likely could have IBS/functional symptoms as suspected in the past and rectal bleeding from hemorrhoids, but she is at the age for regular colonoscopies and last was almost 8 years ago.  Will schedule for colonoscopy, but due to her weight she cannot be done in the [facility].  Will discuss with Dr. Ardis Hughs regarding when we can add her on for a hospital case.  The risks, benefits, and alternatives to colonoscopy were discussed with the patient and she consents to proceed.  Will have her start taking a daily fiber supplement such as Benefiber for her alternating bowel habits.  CC:  Norval Gable, MD

## 2015-04-07 NOTE — Progress Notes (Signed)
We can add her on to my next available EUS Thursday with MAC.  I'll send to patty as well.

## 2015-04-10 ENCOUNTER — Other Ambulatory Visit: Payer: Self-pay

## 2015-04-10 ENCOUNTER — Telehealth: Payer: Self-pay

## 2015-04-10 DIAGNOSIS — R194 Change in bowel habit: Secondary | ICD-10-CM

## 2015-04-10 DIAGNOSIS — K625 Hemorrhage of anus and rectum: Secondary | ICD-10-CM

## 2015-04-10 NOTE — Telephone Encounter (Signed)
Pt has been scheduled for colon at [facility] 04/27/15 1130 am she has the written instructions, she needs to be verbally instructed.  Phone was not accepting calls or messages.  I tried the emergency contact as well and left a message to have the pt call our office.

## 2015-04-10 NOTE — Telephone Encounter (Signed)
-----   Message from Milus Banister, MD sent at 04/07/2015  8:12 AM EST -----   ----- Message -----    From: Loralie Champagne, PA-C    Sent: 04/06/2015   3:30 PM      To: Milus Banister, MD

## 2015-04-11 NOTE — Telephone Encounter (Signed)
Left message on machine to call back  

## 2015-04-12 NOTE — Telephone Encounter (Signed)
Colon scheduled, pt instructed and medications reviewed.  Patient instructions were given to her at her office visit, she has filled in the appt date, time and arrival time as well as directions for completing the prep.  Patient to call with any questions or concerns.

## 2015-04-15 ENCOUNTER — Other Ambulatory Visit: Payer: Self-pay | Admitting: Internal Medicine

## 2015-04-24 ENCOUNTER — Telehealth: Payer: Self-pay | Admitting: Gastroenterology

## 2015-04-24 NOTE — Telephone Encounter (Signed)
The pt has been rescheduled for 05/25/15 730 am she has been reinstructed and will call with any further concerns

## 2015-04-26 ENCOUNTER — Ambulatory Visit (HOSPITAL_COMMUNITY): Payer: Self-pay | Admitting: Clinical

## 2015-05-01 DIAGNOSIS — G43719 Chronic migraine without aura, intractable, without status migrainosus: Secondary | ICD-10-CM | POA: Diagnosis not present

## 2015-05-13 ENCOUNTER — Other Ambulatory Visit (HOSPITAL_COMMUNITY): Payer: Self-pay | Admitting: Psychiatry

## 2015-05-15 ENCOUNTER — Other Ambulatory Visit: Payer: Self-pay | Admitting: Internal Medicine

## 2015-05-17 ENCOUNTER — Other Ambulatory Visit (HOSPITAL_COMMUNITY): Payer: Self-pay | Admitting: Psychiatry

## 2015-05-17 DIAGNOSIS — F331 Major depressive disorder, recurrent, moderate: Secondary | ICD-10-CM

## 2015-05-17 MED ORDER — HYDROXYZINE PAMOATE 25 MG PO CAPS: ORAL_CAPSULE | ORAL | Status: DC

## 2015-05-22 ENCOUNTER — Telehealth: Payer: Self-pay | Admitting: Gastroenterology

## 2015-05-22 MED ORDER — NA SULFATE-K SULFATE-MG SULF 17.5-3.13-1.6 GM/177ML PO SOLN: 1.0000 | Freq: Once | ORAL | Status: DC

## 2015-05-22 NOTE — Telephone Encounter (Signed)
Prescription has been sent as requested.

## 2015-05-23 ENCOUNTER — Encounter (HOSPITAL_COMMUNITY): Payer: Self-pay | Admitting: Psychiatry

## 2015-05-23 ENCOUNTER — Ambulatory Visit (INDEPENDENT_AMBULATORY_CARE_PROVIDER_SITE_OTHER): Payer: Medicare Other | Admitting: Psychiatry

## 2015-05-23 VITALS — BP 132/84 | HR 89 | Ht 71.0 in | Wt 355.0 lb

## 2015-05-23 DIAGNOSIS — F331 Major depressive disorder, recurrent, moderate: Secondary | ICD-10-CM | POA: Diagnosis not present

## 2015-05-23 MED ORDER — BREXPIPRAZOLE 1 MG PO TABS: 1.0000 | ORAL_TABLET | Freq: Every day | ORAL | Status: DC

## 2015-05-23 MED ORDER — CLORAZEPATE DIPOTASSIUM 15 MG PO TABS: ORAL_TABLET | ORAL | Status: DC

## 2015-05-23 MED ORDER — BUPROPION HCL ER (XL) 300 MG PO TB24: ORAL_TABLET | ORAL | Status: DC

## 2015-05-23 MED ORDER — HYDROXYZINE PAMOATE 25 MG PO CAPS: ORAL_CAPSULE | ORAL | Status: DC
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
## 2015-05-24 ENCOUNTER — Telehealth: Payer: Self-pay | Admitting: Gastroenterology

## 2015-05-24 ENCOUNTER — Encounter (HOSPITAL_COMMUNITY): Payer: Self-pay | Admitting: *Deleted

## 2015-05-24 MED ORDER — NA SULFATE-K SULFATE-MG SULF 17.5-3.13-1.6 GM/177ML PO SOLN: 1.0000 | Freq: Once | ORAL | Status: DC

## 2015-05-24 NOTE — Telephone Encounter (Signed)
Patient called c/o that she accidentally dropped the 1st bottle of the kit when she started gagging after drinking some. Advised patient to drink small sips and can mix it with crystal light to flavor. Sent a new Rx for suprep kit to pharmacy

## 2015-05-24 NOTE — Telephone Encounter (Signed)
The pt was advised that she can continue with the procedure as scheduled but to follow the instructions carefully from this point on.  The pt agreed and verbalized understanding of the instructions

## 2015-05-25 ENCOUNTER — Encounter (HOSPITAL_COMMUNITY): Payer: Self-pay

## 2015-05-25 ENCOUNTER — Ambulatory Visit (HOSPITAL_COMMUNITY): Payer: Medicare Other | Admitting: Certified Registered Nurse Anesthetist

## 2015-05-25 ENCOUNTER — Ambulatory Visit (HOSPITAL_COMMUNITY)
Admission: RE | Admit: 2015-05-25 | Discharge: 2015-05-25 | Disposition: A | Discharge status: Home / Self Care | Payer: Medicare Other | Source: Ambulatory Visit | Attending: Gastroenterology | Admitting: Gastroenterology

## 2015-05-25 ENCOUNTER — Telehealth: Payer: Self-pay

## 2015-05-25 ENCOUNTER — Telehealth: Payer: Self-pay | Admitting: Gastroenterology

## 2015-05-25 ENCOUNTER — Encounter (HOSPITAL_COMMUNITY)
Admission: RE | Disposition: A | Discharge status: Home / Self Care | Payer: Self-pay | Source: Ambulatory Visit | Attending: Gastroenterology

## 2015-05-25 DIAGNOSIS — N189 Chronic kidney disease, unspecified: Secondary | ICD-10-CM | POA: Insufficient documentation

## 2015-05-25 DIAGNOSIS — F329 Major depressive disorder, single episode, unspecified: Secondary | ICD-10-CM | POA: Diagnosis not present

## 2015-05-25 DIAGNOSIS — K644 Residual hemorrhoidal skin tags: Secondary | ICD-10-CM | POA: Diagnosis not present

## 2015-05-25 DIAGNOSIS — K648 Other hemorrhoids: Secondary | ICD-10-CM | POA: Insufficient documentation

## 2015-05-25 DIAGNOSIS — D124 Benign neoplasm of descending colon: Secondary | ICD-10-CM | POA: Diagnosis not present

## 2015-05-25 DIAGNOSIS — K625 Hemorrhage of anus and rectum: Secondary | ICD-10-CM | POA: Diagnosis present

## 2015-05-25 DIAGNOSIS — D123 Benign neoplasm of transverse colon: Secondary | ICD-10-CM | POA: Diagnosis not present

## 2015-05-25 DIAGNOSIS — R194 Change in bowel habit: Secondary | ICD-10-CM | POA: Diagnosis not present

## 2015-05-25 DIAGNOSIS — D125 Benign neoplasm of sigmoid colon: Secondary | ICD-10-CM | POA: Diagnosis not present

## 2015-05-25 DIAGNOSIS — K921 Melena: Secondary | ICD-10-CM | POA: Diagnosis not present

## 2015-05-25 DIAGNOSIS — K635 Polyp of colon: Secondary | ICD-10-CM | POA: Diagnosis not present

## 2015-05-25 DIAGNOSIS — F17200 Nicotine dependence, unspecified, uncomplicated: Secondary | ICD-10-CM | POA: Insufficient documentation

## 2015-05-25 DIAGNOSIS — Z79899 Other long term (current) drug therapy: Secondary | ICD-10-CM | POA: Insufficient documentation

## 2015-05-25 DIAGNOSIS — I129 Hypertensive chronic kidney disease with stage 1 through stage 4 chronic kidney disease, or unspecified chronic kidney disease: Secondary | ICD-10-CM | POA: Diagnosis not present

## 2015-05-25 DIAGNOSIS — Z6841 Body Mass Index (BMI) 40.0 and over, adult: Secondary | ICD-10-CM | POA: Insufficient documentation

## 2015-05-25 DIAGNOSIS — Z8719 Personal history of other diseases of the digestive system: Secondary | ICD-10-CM | POA: Diagnosis not present

## 2015-05-25 HISTORY — PX: COLONOSCOPY WITH PROPOFOL: SHX5780

## 2015-05-25 SURGERY — COLONOSCOPY WITH PROPOFOL
Anesthesia: Monitor Anesthesia Care

## 2015-05-25 MED ORDER — SODIUM CHLORIDE 0.9 % IV SOLN: INTRAVENOUS | Status: DC

## 2015-05-25 MED ORDER — PROPOFOL 10 MG/ML IV BOLUS
INTRAVENOUS | Status: AC
  Filled 2015-05-25: qty 20

## 2015-05-25 MED ORDER — ONDANSETRON HCL 4 MG/2ML IJ SOLN
INTRAMUSCULAR | Status: DC | PRN
  Administered 2015-05-25: 4 mg via INTRAVENOUS

## 2015-05-25 MED ORDER — LIDOCAINE HCL (CARDIAC) 20 MG/ML IV SOLN
INTRAVENOUS | Status: DC | PRN
  Administered 2015-05-25: 60 mg via INTRATRACHEAL

## 2015-05-25 MED ORDER — PROPOFOL 10 MG/ML IV BOLUS
INTRAVENOUS | Status: AC
  Filled 2015-05-25: qty 40

## 2015-05-25 MED ORDER — ONDANSETRON HCL 4 MG/2ML IJ SOLN
INTRAMUSCULAR | Status: AC
  Filled 2015-05-25: qty 2

## 2015-05-25 MED ORDER — EPHEDRINE SULFATE 50 MG/ML IJ SOLN
INTRAMUSCULAR | Status: AC
  Filled 2015-05-25: qty 1

## 2015-05-25 MED ORDER — SODIUM CHLORIDE 0.9 % IJ SOLN
INTRAMUSCULAR | Status: AC
  Filled 2015-05-25: qty 10

## 2015-05-25 MED ORDER — PROPOFOL 10 MG/ML IV BOLUS
INTRAVENOUS | Status: DC | PRN
  Administered 2015-05-25 (×3): 10 mg via INTRAVENOUS

## 2015-05-25 MED ORDER — LACTATED RINGERS IV SOLN
INTRAVENOUS | Status: DC
  Administered 2015-05-25: 1000 mL via INTRAVENOUS

## 2015-05-25 MED ORDER — LIDOCAINE HCL (CARDIAC) 20 MG/ML IV SOLN
INTRAVENOUS | Status: AC
  Filled 2015-05-25: qty 5

## 2015-05-25 MED ORDER — PROPOFOL 500 MG/50ML IV EMUL
INTRAVENOUS | Status: DC | PRN
  Administered 2015-05-25: 100 ug/kg/min via INTRAVENOUS

## 2015-05-25 SURGICAL SUPPLY — 21 items

## 2015-05-25 NOTE — Op Note (Signed)
[facility] Patient Name: Lindsay [name] Procedure Date: 05/25/2015 MRN: [id number] Attending MD: Milus Banister , MD Date of Birth: [birth year] CSN:  Age: 45 Admit Type: Outpatient Procedure:                Colonoscopy Indications:              Hematochezia Providers:                Milus Banister, MD, Cleda Daub, RN, Ralene Bathe, Technician, Cherylynn Ridges, Technician Referring MD:              Medicines:                Monitored Anesthesia Care Complications:            No immediate complications. Estimated blood loss:                            None. Estimated Blood Loss:     Estimated blood loss: none. Procedure:                Pre-Anesthesia Assessment:                           - Prior to the procedure, a History and Physical                            was performed, and patient medications and                            allergies were reviewed. The patient's tolerance of                            previous anesthesia was also reviewed. The risks                            and benefits of the procedure and the sedation                            options and risks were discussed with the patient.                            All questions were answered, and informed consent                            was obtained. Prior Anticoagulants: The patient has                            taken no previous anticoagulant or antiplatelet                            agents. ASA Grade Assessment: III - A patient with                            severe systemic  disease. After reviewing the risks                            and benefits, the patient was deemed in                            satisfactory condition to undergo the procedure.                           After obtaining informed consent, the colonoscope                            was passed under direct vision. Throughout the                            procedure, the patient's blood pressure,  pulse, and                            oxygen saturations were monitored continuously. The                            [id number]) scope was introduced through                            the anus and advanced to the the ascending colon.                            This was an incomplete examination. Due to patients                            body habitus (weight 355 pounds, BMI 50) I could                            not advance into the cecum despite position changes                            and abdominal pressure. The colonoscopy was                            performed without difficulty. The patient tolerated                            the procedure well. The quality of the bowel                            preparation was excellent. The ileocecal valve was                            photographed. Scope In: 7:40:36 AM Scope Out: 8:01:02 AM Total Procedure Duration: 0 hours 20 minutes 26 seconds  Findings:      A 5 mm polyp was found in the transverse colon. The polyp was sessile.       The polyp was removed with a cold snare. Resection and retrieval were  complete.      Three pedunculated polyps were found in the sigmoid colon and descending       colon. The polyps were 6 to 9 mm in size. These polyps were removed with       a hot snare. Resection and retrieval were complete.      External and internal hemorrhoids were found. The hemorrhoids were small.      The exam was otherwise without abnormality on direct and retroflexion       views. Impression:               - One 5 mm polyp in the transverse colon, removed                            with a cold snare. Resected and retrieved.                           - Three 6 to 9 mm polyps in the sigmoid colon and                            in the descending colon, removed with a hot snare.                            Resected and retrieved.                           - External and internal hemorrhoids (these are the                             cause of your mild intermittent rectal bleeding).                           - The examination was otherwise normal on direct                            and retroflexion views.                           - This was an incomplete examination. Due to                            patients body habitus (weight 355 pounds, BMI 50) I                            could not advance into the cecum despite position                            changes and abdominal pressure Moderate Sedation:      N/A- Per Anesthesia Care Recommendation:           - Patient has a contact number available for                            emergencies. The signs and symptoms of potential  delayed complications were discussed with the                            patient. Return to normal activities tomorrow.                            Written discharge instructions were provided to the                            patient.                           - Resume previous diet.                           - Continue present medications.                           Dr. Ardis Hughs' office will contact you about barium                            enema to complete the examination of your colon                            (cecum was not seen during this examination).                           You will receive a letter within 2-3 weeks with the                            pathology results and my final recommendations.                           If the polyp(s) is proven to be 'pre-cancerous' on                            pathology, you will need repeat colonoscopy in 3                            years. If the polyp(s) is NOT 'precancerous' on                            pathology then you should repeat colon cancer                            screening in 10 years with colonoscopy without need                            for colon cancer screening by any method prior to                            then (including  stool testing). Procedure Code(s):        --- Professional ---  [id number], 52, Colonoscopy, flexible; with removal of                            tumor(s), polyp(s), or other lesion(s) by snare                            technique Diagnosis Code(s):        --- Professional ---                           D12.3, Benign neoplasm of transverse colon (hepatic                            flexure or splenic flexure)                           D12.5, Benign neoplasm of sigmoid colon                           D12.4, Benign neoplasm of descending colon                           K64.8, Other hemorrhoids                           K92.1, Melena (includes Hematochezia) CPT copyright 2016 American Medical Association. All rights reserved. The codes documented in this report are preliminary and upon coder review may  be revised to meet current compliance requirements. Milus Banister, MD 05/25/2015 8:15:45 AM This report has been signed electronically. Number of Addenda: 0

## 2015-05-25 NOTE — Telephone Encounter (Signed)
Pt aware she can take medication for her HA, she takes a migraine HA preventative Zonigran.

## 2015-05-25 NOTE — Transfer of Care (Signed)
Immediate Anesthesia Transfer of Care Note  Patient: Lindsay [name]  Procedure(s) Performed: Procedure(s): COLONOSCOPY WITH PROPOFOL (N/A)  Patient Location: PACU  Anesthesia Type:MAC  Level of Consciousness:  sedated, patient cooperative and responds to stimulation  Airway & Oxygen Therapy:Patient Spontanous Breathing and Patient connected to face mask oxgen  Post-op Assessment:  Report given to PACU RN and Post -op Vital signs reviewed and stable  Post vital signs:  Reviewed and stable  Last Vitals:  Filed Vitals:   05/25/15 0629  BP: 158/93  Pulse: 81  Temp: 36.9 C  Resp: 17    Complications: No apparent anesthesia complications

## 2015-05-25 NOTE — Discharge Instructions (Signed)

## 2015-05-25 NOTE — Anesthesia Preprocedure Evaluation (Signed)
Anesthesia Evaluation  Patient identified by MRN, date of birth, ID band Patient awake    Reviewed: Allergy & Precautions, NPO status , Patient's Chart, lab work & pertinent test results  Airway Mallampati: II  TM Distance: >3 FB Neck ROM: Full    Dental no notable dental hx.    Pulmonary Current Smoker,    Pulmonary exam normal breath sounds clear to auscultation + decreased breath sounds      Cardiovascular hypertension, Pt. on medications Normal cardiovascular exam Rhythm:Regular Rate:Normal     Neuro/Psych negative neurological ROS  negative psych ROS   GI/Hepatic negative GI ROS, Neg liver ROS,   Endo/Other  Morbid obesity  Renal/GU negative Renal ROS  negative genitourinary   Musculoskeletal negative musculoskeletal ROS (+)   Abdominal (+) + obese,   Peds negative pediatric ROS (+)  Hematology negative hematology ROS (+)   Anesthesia Other Findings   Reproductive/Obstetrics negative OB ROS                             Anesthesia Physical Anesthesia Plan  ASA: III  Anesthesia Plan: MAC   Post-op Pain Management:    Induction: Intravenous  Airway Management Planned: Simple Face Mask  Additional Equipment:   Intra-op Plan:   Post-operative Plan: Extubation in OR  Informed Consent: I have reviewed the patients History and Physical, chart, labs and discussed the procedure including the risks, benefits and alternatives for the proposed anesthesia with the patient or authorized representative who has indicated his/her understanding and acceptance.   Dental advisory given  Plan Discussed with: CRNA and Surgeon  Anesthesia Plan Comments:         Anesthesia Quick Evaluation

## 2015-05-25 NOTE — Anesthesia Procedure Notes (Signed)
Procedure Name: MAC Date/Time: 05/25/2015 7:36 AM Performed by: West Pugh Pre-anesthesia Checklist: Patient identified, Timeout performed, Emergency Drugs available, Suction available and Patient being monitored Patient Re-evaluated:Patient Re-evaluated prior to inductionOxygen Delivery Method: Simple face mask Dental Injury: Teeth and Oropharynx as per pre-operative assessment

## 2015-05-25 NOTE — Anesthesia Postprocedure Evaluation (Signed)
Anesthesia Post Note  Patient: Lindsay [name]  Procedure(s) Performed: Procedure(s) (LRB): COLONOSCOPY WITH PROPOFOL (N/A)  Patient location during evaluation: PACU Anesthesia Type: MAC Level of consciousness: awake and alert Pain management: pain level controlled Vital Signs Assessment: post-procedure vital signs reviewed and stable Respiratory status: spontaneous breathing, nonlabored ventilation, respiratory function stable and patient connected to nasal cannula oxygen Cardiovascular status: blood pressure returned to baseline and stable Postop Assessment: no signs of nausea or vomiting Anesthetic complications: no    Last Vitals:  Filed Vitals:   05/25/15 0814 05/25/15 0820  BP: 142/84 138/77  Pulse:    Temp: 36.7 C   Resp:      Last Pain: There were no vitals filed for this visit.               Gudrun Axe S

## 2015-05-25 NOTE — H&P (Signed)
04/04/2015 Lindsay [name] [id number] 1970/07/01   HISTORY OF PRESENT ILLNESS: This is a 45 year old female previously known to Dr. Ardis Hughs. Last seen here in 2012, suspected to have some functional bowel issues. Had colonoscopy in 07/2007 with one polyp that was removed and was a hyperplastic polyp; also had hemorrhoids. Her PMH includes bipolar/depression and anxiety, fibromyalgia, migraines, and hidradenitis. Presents to the office today at the request of her PCP, Dr. Merrilyn Puma, with complaints of rectal bleeding and mucus in her stool. Says that since November she's had two episodes of rectal bleeding with moderate amount of blood. Each time bleeding only occurred with one stool before resolving. Also passes mucus from her rectum as well. Says that she moves her bowels once a day but sometimes they are loose and sometimes hard and feels constipated. Says that she sees undigested food such as lettuce floating in her stool at times. Also has bloating and diffuse abdominal discomfort on and off. Has gained 100 pounds in the past 1.5 years, which is thought to possibly have been from the Abilify that she was on previously. Does not take any medications for her bowels. Says that she used to take a fiber supplement but hasn't taken that since 2010 or 2011. Last labs were in September of 2016 with normal CBC, TSH, and CMP at that time.  Past Medical History  Diagnosis Date  . Depression with anxiety   . Hypertension   . Anorexia   . IBS (irritable bowel syndrome)   . Low back pain   . Edema leg   . Hemorrhoids   . Panic attacks   . Anemia     Iron Def  . Colon polyp 2009  . Migraines   . Colon polyps   . Neuromuscular disorder (HCC)     fibromyalgia  . Tonsil pain   . Polyp of vocal cord or larynx   . Hidradenitis suppurativa   . Depression   . Chronic kidney disease     Nephrotic syndrome   Past Surgical History   Procedure Laterality Date  . Hemorrhoid surgery      with Hidradenitis surgery   . Upper gastrointestinal endoscopy    . Axillary hidradenitis excision    . Inguinal hidradenitis excision    . Tonsillectomy  10/18/2010    by Dr. Wilburn Cornelia    reports that she has been smoking Cigarettes. She has a 12.5 pack-year smoking history. She has never used smokeless tobacco. She reports that she does not drink alcohol or use illicit drugs. family history includes Anxiety disorder in her mother and sister; Cancer in her father; Depression in her mother, sister, and sister; Diabetes in her mother; Heart disease in her father and mother; Learning disabilities in her sister. There is no history of Colon cancer. No Known Allergies   Outpatient Encounter Prescriptions as of 04/04/2015  Medication Sig  . BIOTIN PO Take 1 capsule by mouth daily.  . Brexpiprazole (REXULTI) 1 MG TABS Take 1 tablet by mouth daily.  [name] buPROPion (WELLBUTRIN XL) 300 MG 24 hr tablet TAKE 1 TABLET (300 MG TOTAL) BY MOUTH EVERY MORNING.  . cholecalciferol (VITAMIN D) 1000 units tablet Take 1,000 Units by mouth daily.  . clindamycin (CLEOCIN T) 1 % external solution Apply 1 application topically as needed. For hidradenitis flare ups  . clorazepate (TRANXENE) 15 MG tablet TAKE 1 TABLET BY MOUTH EVERY DAY IN THE MORNING  . gabapentin (NEURONTIN) 600 MG tablet Take 0.5 tablets (300 mg total) by  mouth daily.  . hydrOXYzine (VISTARIL) 25 MG capsule TAKE 1 CAPSULE (25 MG TOTAL) BY MOUTH DAILY AS NEEDED FOR ANXIETY.  . Multiple Vitamins-Minerals (MULTIVITAMIN PO) Take 1 capsule by mouth daily.  [name] omeprazole (PRILOSEC) 40 MG capsule Take 1 capsule (40 mg total) by mouth daily.  . promethazine (PHENERGAN) 25 MG tablet TAKE 1 TABLET (25 MG TOTAL) BY MOUTH EVERY 6 (SIX) HOURS AS NEEDED. FOR NAUSEA  . [DISCONTINUED] clindamycin (CLEOCIN-T) 1 % external solution Apply to  affected area 2 times daily (Patient taking differently: as needed. Apply to affected area prn for hidradentis flares)   No facility-administered encounter medications on file as of 04/04/2015.     REVIEW OF SYSTEMS : All other systems reviewed and negative except where noted in the History of Present Illness.   PHYSICAL EXAM: BP 130/80 mmHg  Pulse 84  Ht 5\' 11"  (1.803 m)  Wt 354 lb 6.4 oz (160.755 kg)  BMI 49.45 kg/m2  LMP  General: Well developed black female in no acute distress Head: Normocephalic and atraumatic Eyes: Sclerae anicteric, conjunctiva pink. Ears: Normal auditory acuity Lungs: Clear throughout to auscultation Heart: Regular rate and rhythm Abdomen: Soft, non-distended. Obese. Normal bowel sounds. Non-tender. Rectal: Will be done at the time of colonoscopy. Musculoskeletal: Symmetrical with no gross deformities  Skin: No lesions on visible extremities Extremities: No edema  Neurological: Alert oriented x 4, grossly non-focal Psychological: Alert and cooperative. Normal mood and affect  ASSESSMENT AND PLAN: -45 year old AA female with alternating bowel habits of constipation and loose stools who has also had rectal bleeding and mucus with BM's. Very likely could have IBS/functional symptoms as suspected in the past and rectal bleeding from hemorrhoids, but she is at the age for regular colonoscopies and last was almost 8 years ago. Will schedule for colonoscopy, but due to her weight she cannot be done in the [place]. Will discuss with Dr. Ardis Hughs regarding when we can add her on for a hospital case. The risks, benefits, and alternatives to colonoscopy were discussed with the patient and she consents to proceed. Will have her start taking a daily fiber supplement such as Benefiber for her alternating bowel habits.

## 2015-05-25 NOTE — Telephone Encounter (Signed)
-----   Message from Milus Banister, MD sent at 05/25/2015  8:17 AM EDT ----- Marykay Lex,  She needs barium enema to complete the examination of her colon.  Colonoscopy today was not able to visualize her cecum.  Thanks  dj

## 2015-05-27 ENCOUNTER — Encounter (HOSPITAL_COMMUNITY): Payer: Self-pay | Admitting: Gastroenterology

## 2015-05-29 ENCOUNTER — Other Ambulatory Visit: Payer: Self-pay

## 2015-05-29 DIAGNOSIS — R933 Abnormal findings on diagnostic imaging of other parts of digestive tract: Secondary | ICD-10-CM

## 2015-05-29 NOTE — Telephone Encounter (Signed)
See alternate note  

## 2015-06-02 ENCOUNTER — Ambulatory Visit (HOSPITAL_COMMUNITY): Payer: Medicare Other

## 2015-06-14 ENCOUNTER — Other Ambulatory Visit: Payer: Self-pay | Admitting: Internal Medicine

## 2015-06-14 NOTE — Telephone Encounter (Signed)
Last filled 4/29 per [facility].

## 2015-06-28 ENCOUNTER — Encounter (HOSPITAL_COMMUNITY): Payer: Self-pay | Admitting: Clinical

## 2015-06-28 ENCOUNTER — Ambulatory Visit (INDEPENDENT_AMBULATORY_CARE_PROVIDER_SITE_OTHER): Payer: Medicare Other | Admitting: Clinical

## 2015-06-28 DIAGNOSIS — F411 Generalized anxiety disorder: Secondary | ICD-10-CM | POA: Diagnosis not present

## 2015-06-28 DIAGNOSIS — F410 Panic disorder [episodic paroxysmal anxiety] without agoraphobia: Secondary | ICD-10-CM

## 2015-06-28 DIAGNOSIS — F331 Major depressive disorder, recurrent, moderate: Secondary | ICD-10-CM
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
## 2015-06-30 ENCOUNTER — Ambulatory Visit: Payer: Self-pay | Admitting: Internal Medicine

## 2015-07-03 NOTE — Progress Notes (Signed)
   THERAPIST PROGRESS NOTE  Session Time: 11:02 -11:58  Participation Level: Active  Behavioral Response: CasualAlertDepressed  Type of Therapy: Individual Therapy  Treatment Goals addressed: improve psychiatric symptoms, calming skills, healthy coping skills (self-care), emotional regulations skills  Interventions: CBT, Motivational Interviewing and Other: Mindfulness and grounding techniques  Summary: Lindsay [name] is a 45 y.o. female who presents with Major Depressive Disorder, and Generalized Anxiety, panic disorder without agoraphobia  Suicidal/Homicidal: No    -   without intent/plan  Therapist Response:  Lindsay [name] met with clinician for an individual session. Lindsay [name] discussed her psychiatric symptoms, her current life events. Client and clinician updated her assessment. Lindsay [name] shared that she has been going to the doctor a lot due to pain, taking care of her mother and her daughter. Lindsay [name] shared that she has been overwhelmed with this. Client and clinician reviewed and discussed the coping skills she has been using. Client shared that she has not been practicing what she learned in therapy as often as would be helpful. Client and clinician reviewed some healthy coping skills. Client and clinician reviewed some of the grounding and mindfulness techniques and practiced one together. Clinician gave Lindsay [name] a homework packet which she agreed to complete before next session in addition to practicing her grounding and mindfulness techniques.  Plan: Return again in 2 weeks.  Diagnosis: Axis I: Major Depressive Disorder, and Generalized Anxiety, panic disorder without agoraphobia    Lindsay [name] A, LCSW 07/03/2015

## 2015-07-04 ENCOUNTER — Encounter: Payer: Self-pay | Admitting: Internal Medicine

## 2015-07-04 ENCOUNTER — Ambulatory Visit (INDEPENDENT_AMBULATORY_CARE_PROVIDER_SITE_OTHER): Payer: Medicare Other | Admitting: Internal Medicine

## 2015-07-04 VITALS — BP 177/91 | HR 90 | Temp 98.6°F | Wt 361.0 lb

## 2015-07-04 DIAGNOSIS — M797 Fibromyalgia: Secondary | ICD-10-CM | POA: Diagnosis not present

## 2015-07-04 DIAGNOSIS — K649 Unspecified hemorrhoids: Secondary | ICD-10-CM

## 2015-07-04 DIAGNOSIS — Z6841 Body Mass Index (BMI) 40.0 and over, adult: Secondary | ICD-10-CM | POA: Diagnosis not present

## 2015-07-04 DIAGNOSIS — R194 Change in bowel habit: Secondary | ICD-10-CM

## 2015-07-04 DIAGNOSIS — K625 Hemorrhage of anus and rectum: Secondary | ICD-10-CM

## 2015-07-04 MED ORDER — KETOROLAC TROMETHAMINE 30 MG/ML IJ SOLN
60.0000 mg | Freq: Once | INTRAMUSCULAR | Status: AC
  Administered 2015-07-04: 60 mg via INTRAMUSCULAR

## 2015-07-04 MED ORDER — LIDOCAINE (ANORECTAL) 5 % EX GEL: 1.0000 [drp] | Freq: Every day | CUTANEOUS | Status: DC | PRN

## 2015-07-04 NOTE — Patient Instructions (Signed)
Mrs. Sturdivant,  Make sure to make her appointments with the counselor and psychiatrist to see about adjusting her medications to help with the weight. I hope he injection today did help with your pain and will help you be able to start going to the [facility]. I did give you a scholarship letter again today. I really do think even 20 minutes of exercise a few times a week will help you feel better and will actually help your pain long-term as well as help with the weight. We do want to see you in a couple of months, after you've seen the psychiatrist at the end of July. I have also prescribed some topical lidocaine that should give you some relief.  Thanks, Blane Ohara

## 2015-07-05 DIAGNOSIS — M797 Fibromyalgia: Secondary | ICD-10-CM | POA: Insufficient documentation

## 2015-07-05 DIAGNOSIS — K649 Unspecified hemorrhoids: Secondary | ICD-10-CM | POA: Insufficient documentation

## 2015-07-05 NOTE — Assessment & Plan Note (Addendum)
Continues to steadily gain weight and likely due to combination of increased caloric intake, decreased physical activity, and her psychiatric medicines, almost certainly brexipiprazole. I doubt that the degree of this waking is due solely to medications that she is only taking very low-dose of gabapentin and only taking intermittently suggests that that is not the cause. She has not yet gone to the [facility] despite me writing a letter. I reiterated the importance of this and she is in agreement. -Decrease caloric intake -Re-wrote scholarship letter for [employer]. Patient says she will do this this week. Our ideal goal is 20 minutes a day a few times a week, even if it is just walking. -Follow-up with psychiatry regarding changing her psychiatric meds for her disorder and fibromyalgia. She has an appointment next month. Brexipiprazole is likely contributing to her weight gain and this may need to be switched

## 2015-07-05 NOTE — Progress Notes (Signed)
   Patient ID: Lindsay [name] female   DOB: 1970-05-18 45 y.o.   MRN: [id number]  Subjective:   HPI: [name] is a 45 y.o. with PMH of morbid obesity, fibromyalgia, anxiety/depression, chronic headaches, and chronic bilateral lower extremity edema and neuropathy who presents to [facility] today for evaluation of worsening fibromyalgia and weight gain.   Please see problem-based charting for status of medical issues pertinent to this visit.  Review of Systems: Pertinent items noted in HPI and remainder of comprehensive ROS otherwise negative.  Objective:  Physical Exam: Filed Vitals:   07/04/15 1050 07/04/15 1138  BP: 181/115 177/91  Pulse: 90   Temp: 98.6 F (37 C)   TempSrc: Oral   Weight: 361 lb (163.749 kg)   SpO2: 100%    Gen: Tired-appearing, alert and oriented to person, place, and time. Morbidly obese. HEENT: Oropharynx clear without erythema or exudate.  Neck: No cervical LAD, no thyromegaly or nodules, no JVD noted. CV: Normal rate, regular rhythm, no murmurs, rubs, or gallops Pulmonary: Normal effort, CTA bilaterally, no wheezing, rales, or rhonchi Abdominal: Soft, non-tender, non-distended, without rebound, guarding, or masses Extremities: Distal pulses 2+ in upper and lower extremities bilaterally, diffuse tenderness in several areas over all extremities and back, erythema or edema Skin: No atypical appearing moles. No rashes  Assessment & Plan:  Please see problem-based charting for assessment and plan.  Blane Ohara, MD Resident Physician, PGY-1 Department of Internal Medicine [facility]

## 2015-07-05 NOTE — Assessment & Plan Note (Signed)
Patient to follow-up with Dr. Ardis Hughs for repeat colonoscopy, but exam was limited due to incomplete prep. She is supposed to get a barium study to complete. -Instructed patient follow-up with Dr. Ardis Hughs regarding this

## 2015-07-05 NOTE — Assessment & Plan Note (Signed)
Describes no recent worsening of rectal bleeding or itching pain or irritation. She does request topical lidocaine gel as this is helped in the past better than Preparation H. -Prescribed topical lidocaine 5% anorectal

## 2015-07-05 NOTE — Assessment & Plan Note (Signed)
Patient has worsening diffuse achiness in her upper and lower extremities as well as her lower back and neck and head, consistent with worsening fibromyalgia flare. She denies any apparent triggers. In terms of her concomitant depressive symptoms, she describes stable but severe avolition as her most disabling symptom. -Toradol 60 mg injection here for fibromyalgia flare -Encourage physical activity. Once again wrote a scholarship for her to go to the [facility] for membership, which she has done in the past -Follow-up with psychiatry and behavioral counseling

## 2015-07-05 NOTE — Progress Notes (Signed)
Internal Medicine Clinic Attending  Case discussed with Dr. Kennedy at the time of the visit.  We reviewed the resident's history and exam and pertinent patient test results.  I agree with the assessment, diagnosis, and plan of care documented in the resident's note.  

## 2015-07-06 ENCOUNTER — Telehealth: Payer: Self-pay

## 2015-07-06 NOTE — Telephone Encounter (Signed)
Mendel Ryder from [place] requesting the nurse to call back regarding Lidocaine.

## 2015-07-06 NOTE — Telephone Encounter (Signed)
Pharm calls and needs clarification on the lidocaine, is this : Lidocaine 5% w/ hydrocortisone 2.5% rectal, is this what you wanted Or do you want a lidocaine 5% topical gel- Do you want rectal or topical? Please advise

## 2015-07-07 NOTE — Telephone Encounter (Signed)
Lindsay [name],  I meant to order topical lidocaine 5% gel (not with epi) rectally for hemorrhoids.  Lindsay [name]

## 2015-07-07 NOTE — Telephone Encounter (Signed)
Called pharm and clarified.

## 2015-07-07 NOTE — Telephone Encounter (Signed)
Pharm now calls and states that the one you sent in is no longer stocked, they do stock nupercainal ointment, would this be acceptable? If so could you send a new script?

## 2015-07-11 ENCOUNTER — Other Ambulatory Visit: Payer: Self-pay | Admitting: Internal Medicine

## 2015-07-11 MED ORDER — HYDROCORTISONE 1 % EX CREA: TOPICAL_CREAM | CUTANEOUS | Status: DC

## 2015-07-18 ENCOUNTER — Encounter: Payer: Self-pay | Admitting: *Deleted

## 2015-07-20 ENCOUNTER — Other Ambulatory Visit: Payer: Self-pay | Admitting: Internal Medicine

## 2015-08-03 ENCOUNTER — Encounter (HOSPITAL_COMMUNITY): Payer: Self-pay | Admitting: Clinical

## 2015-08-03 ENCOUNTER — Ambulatory Visit (INDEPENDENT_AMBULATORY_CARE_PROVIDER_SITE_OTHER): Payer: Medicare Other | Admitting: Clinical

## 2015-08-03 DIAGNOSIS — F331 Major depressive disorder, recurrent, moderate: Secondary | ICD-10-CM | POA: Diagnosis not present

## 2015-08-03 DIAGNOSIS — F411 Generalized anxiety disorder: Secondary | ICD-10-CM | POA: Diagnosis not present

## 2015-08-03 NOTE — Progress Notes (Signed)
   THERAPIST PROGRESS NOTE  Session Time:8:00 - 8:55  Participation Level: Active  Behavioral Response: CasualAlertDepressed  Type of Therapy: Individual Therapy  Treatment Goals addressed: improve psychiatric symptoms, calming skills, healthy coping skills (self-care), emotional regulations skills  Interventions: CBT, Motivational Interviewing and Other: Mindfulness  techniques  Summary: Lindsay [name] is a 45 y.o. female who presents with Major Depressive Disorder, and Generalized Anxiety, panic disorder without agoraphobia  Suicidal/Homicidal: No    -   without intent/plan  Therapist Response:  Arrielle met with clinician for an individual session. Lindsay [name] discussed her psychiatric symptoms, her current life events and her homework. Lindsay [name] shared that she has been depressed, in a lot of physical pain and overwhelmed. Lindsay [name] shared that she is exhausted taking care of her mother and daughter. She shared that she also has her sister and niece living with her. Lindsay [name] shared about the family dynamics and her role. Clinician asked open ended questions and Lindsay [name] identified some of her thoughts and behaviors that were contributing to her overdoing for others. Lindsay [name] and clinician discussed some basic cbt concepts. Lindsay [name] and clinician discussed how our thoughts inform our emotions and actions. Clinician asked open ended questions and Lindsay [name] identified some healthy boundaries she could create and maintain while still taking care of her "duties". Lindsay [name] and clinician discussed self care. Clinician introduced some mindfulness techniques. Clinician gave her resources she could use to support her practice. Lindsay [name] and clinician discussed the benefit of doing these daily. Lindsay [name] agreed to try to practice daily.  Plan: Return again in 2 weeks.  Diagnosis: Axis I: Major Depressive Disorder, and Generalized Anxiety, panic disorder without agoraphobia   Chonte Ricke A, LCSW 08/03/2015

## 2015-08-07 ENCOUNTER — Other Ambulatory Visit: Payer: Self-pay | Admitting: Internal Medicine

## 2015-08-21 ENCOUNTER — Ambulatory Visit (HOSPITAL_COMMUNITY): Payer: Self-pay | Admitting: Psychiatry

## 2015-08-26 ENCOUNTER — Other Ambulatory Visit (HOSPITAL_COMMUNITY): Payer: Self-pay | Admitting: Psychiatry

## 2015-08-26 DIAGNOSIS — F331 Major depressive disorder, recurrent, moderate: Secondary | ICD-10-CM

## 2015-09-05 ENCOUNTER — Other Ambulatory Visit (HOSPITAL_COMMUNITY): Payer: Self-pay | Admitting: Psychiatry

## 2015-09-05 DIAGNOSIS — F331 Major depressive disorder, recurrent, moderate: Secondary | ICD-10-CM

## 2015-09-06 ENCOUNTER — Ambulatory Visit (HOSPITAL_COMMUNITY): Payer: Self-pay | Admitting: Clinical

## 2015-09-06 ENCOUNTER — Other Ambulatory Visit (HOSPITAL_COMMUNITY): Payer: Self-pay

## 2015-09-06 DIAGNOSIS — F331 Major depressive disorder, recurrent, moderate: Secondary | ICD-10-CM

## 2015-09-06 MED ORDER — CLORAZEPATE DIPOTASSIUM 15 MG PO TABS: ORAL_TABLET | ORAL | 0 refills | Status: DC

## 2015-09-06 MED ORDER — BREXPIPRAZOLE 1 MG PO TABS: 1.0000 | ORAL_TABLET | Freq: Every day | ORAL | 0 refills | Status: DC

## 2015-09-14 ENCOUNTER — Telehealth (HOSPITAL_COMMUNITY): Payer: Self-pay | Admitting: *Deleted

## 2015-09-14 NOTE — Telephone Encounter (Signed)
Prior authorization for Wellbutrin received. Called pharmacy to verify it still needed. They state it was done on 8/3 and no longer required.

## 2015-09-18 ENCOUNTER — Other Ambulatory Visit: Payer: Self-pay | Admitting: Internal Medicine

## 2015-09-26 ENCOUNTER — Ambulatory Visit (INDEPENDENT_AMBULATORY_CARE_PROVIDER_SITE_OTHER): Payer: Medicare Other | Admitting: Clinical

## 2015-09-26 DIAGNOSIS — F331 Major depressive disorder, recurrent, moderate: Secondary | ICD-10-CM

## 2015-09-26 DIAGNOSIS — F411 Generalized anxiety disorder: Secondary | ICD-10-CM | POA: Diagnosis not present

## 2015-09-26 DIAGNOSIS — F410 Panic disorder [episodic paroxysmal anxiety] without agoraphobia: Secondary | ICD-10-CM

## 2015-09-26 NOTE — Progress Notes (Signed)
   THERAPIST PROGRESS NOTE  Session Time: 8:05 - 8:58  Participation Level: Active  Behavioral Response: CasualAlertDepressed  Type of Therapy: Individual Therapy  Treatment Goals addressed: improve psychiatric symptoms, calming skills, healthy coping skills (self-care), emotional regulations skills  Interventions: CBT, Motivational Interviewing and grounding techniques  Summary: Lindsay [name] is a 45 y.o. female who presents with Major Depressive Disorder, and Generalized Anxiety, panic disorder without agoraphobia  Suicidal/Homicidal: No    -   without intent/plan  Therapist Response:  Latrell met with clinician for an individual session. Nikeria discussed her psychiatric symptoms and her current life events. Edelin shared that she has not completed a homework packet. She shared that she has continued to use her grounding and mindfulness techniques. She shared that she continues to feel depressed she shared that  young man in her area continue to die. She shared that this is contributed to her depression has also a affected her daughter. Client and clinician discussed her thoughts and her emotions. Client and clinician discussed grief. Clanging clinician discussed what was in her power to change and what was not. Client and clinician discussed of emotional regulation skills and choice focus. Clinician used a 7 panel thought record sheet to use as an example. Tyronda identified negative thought. client and clinician discussed the evidence for and against the thought thought. Kaprice was able to formulate healthier alternative thoughts client and clinician discussed how the different thoughts would affect her emotions and in turn her actions. Clinician asked open ended questions and client identified ways she could practice self care. Client and clinician practiced a grounding technique together    Plan: Return again in 2 weeks.  Diagnosis: Axis I: Major Depressive Disorder, and  Generalized Anxiety, panic disorder without agoraphobia     Zak Gondek A, LCSW 09/26/2015

## 2015-09-27 ENCOUNTER — Other Ambulatory Visit: Payer: Self-pay | Admitting: Internal Medicine

## 2015-09-27 ENCOUNTER — Other Ambulatory Visit (HOSPITAL_COMMUNITY): Payer: Self-pay | Admitting: Psychiatry

## 2015-09-27 DIAGNOSIS — F331 Major depressive disorder, recurrent, moderate: Secondary | ICD-10-CM

## 2015-10-03 ENCOUNTER — Encounter (HOSPITAL_COMMUNITY): Payer: Self-pay | Admitting: Clinical

## 2015-10-05 ENCOUNTER — Other Ambulatory Visit (HOSPITAL_COMMUNITY): Payer: Self-pay | Admitting: Psychiatry

## 2015-10-05 DIAGNOSIS — F331 Major depressive disorder, recurrent, moderate: Secondary | ICD-10-CM

## 2015-10-06 ENCOUNTER — Ambulatory Visit (HOSPITAL_COMMUNITY): Payer: Self-pay | Admitting: Psychiatry

## 2015-10-06 ENCOUNTER — Other Ambulatory Visit: Payer: Self-pay | Admitting: *Deleted

## 2015-10-08 ENCOUNTER — Other Ambulatory Visit (HOSPITAL_COMMUNITY): Payer: Self-pay | Admitting: Psychiatry

## 2015-10-08 DIAGNOSIS — F331 Major depressive disorder, recurrent, moderate: Secondary | ICD-10-CM

## 2015-10-09 ENCOUNTER — Ambulatory Visit (INDEPENDENT_AMBULATORY_CARE_PROVIDER_SITE_OTHER): Payer: Medicare Other | Admitting: Internal Medicine

## 2015-10-09 ENCOUNTER — Other Ambulatory Visit (HOSPITAL_COMMUNITY): Payer: Self-pay

## 2015-10-09 VITALS — BP 161/102 | HR 86 | Temp 98.4°F | Ht 71.0 in | Wt 359.4 lb

## 2015-10-09 DIAGNOSIS — Z833 Family history of diabetes mellitus: Secondary | ICD-10-CM | POA: Diagnosis not present

## 2015-10-09 DIAGNOSIS — Z0000 Encounter for general adult medical examination without abnormal findings: Secondary | ICD-10-CM

## 2015-10-09 DIAGNOSIS — M797 Fibromyalgia: Secondary | ICD-10-CM

## 2015-10-09 DIAGNOSIS — K649 Unspecified hemorrhoids: Secondary | ICD-10-CM

## 2015-10-09 DIAGNOSIS — Z8249 Family history of ischemic heart disease and other diseases of the circulatory system: Secondary | ICD-10-CM | POA: Diagnosis not present

## 2015-10-09 DIAGNOSIS — I10 Essential (primary) hypertension: Secondary | ICD-10-CM

## 2015-10-09 DIAGNOSIS — M79605 Pain in left leg: Secondary | ICD-10-CM | POA: Diagnosis not present

## 2015-10-09 DIAGNOSIS — R194 Change in bowel habit: Secondary | ICD-10-CM | POA: Diagnosis not present

## 2015-10-09 DIAGNOSIS — K648 Other hemorrhoids: Secondary | ICD-10-CM

## 2015-10-09 DIAGNOSIS — Z86010 Personal history of colonic polyps: Secondary | ICD-10-CM

## 2015-10-09 DIAGNOSIS — Z23 Encounter for immunization: Secondary | ICD-10-CM | POA: Diagnosis not present

## 2015-10-09 DIAGNOSIS — F17210 Nicotine dependence, cigarettes, uncomplicated: Secondary | ICD-10-CM

## 2015-10-09 DIAGNOSIS — M7989 Other specified soft tissue disorders: Secondary | ICD-10-CM

## 2015-10-09 DIAGNOSIS — Z6841 Body Mass Index (BMI) 40.0 and over, adult: Secondary | ICD-10-CM | POA: Diagnosis not present

## 2015-10-09 DIAGNOSIS — M79602 Pain in left arm: Secondary | ICD-10-CM | POA: Diagnosis not present

## 2015-10-09 DIAGNOSIS — F331 Major depressive disorder, recurrent, moderate: Secondary | ICD-10-CM

## 2015-10-09 MED ORDER — CLORAZEPATE DIPOTASSIUM 15 MG PO TABS: ORAL_TABLET | ORAL | 1 refills | Status: DC

## 2015-10-09 MED ORDER — BREXPIPRAZOLE 1 MG PO TABS: 1.0000 | ORAL_TABLET | Freq: Every day | ORAL | 1 refills | Status: DC

## 2015-10-09 MED ORDER — LISINOPRIL 10 MG PO TABS
10.0000 mg | ORAL_TABLET | Freq: Every day | ORAL | 11 refills | Status: DC
Start: 2015-10-09 — End: 2015-11-06

## 2015-10-09 MED ORDER — GABAPENTIN 600 MG PO TABS: 600.0000 mg | ORAL_TABLET | Freq: Two times a day (BID) | ORAL | 3 refills | Status: DC

## 2015-10-09 MED ORDER — LIDOCAINE (ANORECTAL) 5 % EX GEL: 1.0000 [drp] | Freq: Every day | CUTANEOUS | 3 refills | Status: DC | PRN

## 2015-10-09 MED ORDER — PROMETHAZINE HCL 25 MG PO TABS: ORAL_TABLET | ORAL | 0 refills | Status: DC

## 2015-10-09 MED ORDER — MELOXICAM 7.5 MG PO TABS: 7.5000 mg | ORAL_TABLET | Freq: Every day | ORAL | 2 refills | Status: DC

## 2015-10-09 MED ORDER — BUPROPION HCL ER (XL) 300 MG PO TB24: 300.0000 mg | ORAL_TABLET | Freq: Every morning | ORAL | 1 refills | Status: DC

## 2015-10-09 NOTE — Telephone Encounter (Signed)
Promethazine being refilled today.

## 2015-10-09 NOTE — Assessment & Plan Note (Signed)
We gave her flu shot today.

## 2015-10-09 NOTE — Assessment & Plan Note (Signed)
She has history of worsening pain for the last couple of weeks, she states the pain is worse in his lower extremity especially around the hip and knee bilaterally. It's mostly dull ache which she said around 10/10 most of the time.  She gives history of more pain around her left knee accompanied with some swelling and increased in temperature. She complains of having fever on last Thursday and Saturday, but she was afebrile today, she denies any recent use of ibuprofen or naproxen. She is unable to walk, not been able to get a good night's sleep due to this pain. She also complains of some shooting pains accompanied with pain and needles sensation in her leg bilaterally, she gets some relieved with Neurontin. She has an history of fibromyalgia, has tried ibuprofen 800 mg 3 times a day, naproxen 500 mg twice daily without any relief. Assessment. It might be due to her fibromyalgia exacerbation. Her increased in temperature and more swelling around left knee can be due to some infective causes. She complains of having fever on Thursday and Saturday but she was afebrile during this visit. She denies any recent use of antipyretics. There was increase in temperature and more swelling of left below knee as compared to right. Plan. Ultrasound of her left leg. Meloxicam  7.5 mg daily for inflammation. Increase Neurontin to 600 mg twice a day. I will consider starting her on amitriptyline 25 mg at night after ruling out any acute infection or inflammation.

## 2015-10-09 NOTE — Assessment & Plan Note (Signed)
BP Readings from Last 3 Encounters:  10/09/15 (!) 161/102  07/04/15 (!) 177/91  05/25/15 138/77  She states that she was also told about increased in blood pressure during check at some pharmacies. Plan. Start her on lisinopril 10 mg daily Reassess during next visit.

## 2015-10-09 NOTE — Progress Notes (Signed)
   [id number] body aches and weight gain.  HPI:  [name] is a 45 y.o. with past medical history as listed below, presented to the clinic today with complaints of worsening body aches and pains mostly in her legs and lower back. She has an history of fibromyalgia, has tried ibuprofen 800 mg 3 times a day, naproxen 500 mg twice daily without any relief. She has history of worsening pain for the last couple of weeks, she states the pain is worse in his lower extremity especially around the hip and knee bilaterally. It's mostly dull ache which she said around 10/10 most of the time.  She gives history of more pain around her left knee accompanied with some swelling and increased in temperature. She complains of having fever on last Thursday and Saturday, but she was afebrile today, she denies any recent use of ibuprofen or naproxen. She is unable to walk, not been able to get a good night's sleep due to this pain. She also complains of some shooting pains accompanied with pain and needles sensation in her leg bilaterally, she gets some relieved with Neurontin. She also complains of nausea almost every day for which she uses Phenergan 50 mg in the morning. She denies any vomiting, change in her bowel habits.  She is being followed up at behavioral health for her psychological issues and on multiple medicines for depression.  She is still a smoker, stating that she is trying to quit and now uses just 2 cigarettes per day. Denies any alcohol or illicit drug use.  Her family history is positive for CHF, diabetes, hypertension in her mother. Her 13 year old daughter is recently diagnosed with diabetes.    Past Medical History:  Diagnosis Date  . Anemia    Iron Def  . Anorexia   . Chronic kidney disease    Nephrotic syndrome  . Colon polyp 2009  . Colon polyps   . Depression   . Depression with anxiety   . Edema leg   . Hemorrhoids   . Hidradenitis suppurativa   . Hypertension     . IBS (irritable bowel syndrome)   . Low back pain   . Migraines   . Neuromuscular disorder (HCC)    fibromyalgia  . Panic attacks   . Polyp of vocal cord or larynx   . Tonsil pain     Review of Systems:  As per HPI.  Physical Exam:  Vitals:   10/09/15 1011  BP: (!) 161/102  Pulse: 86  Temp: 98.4 F (36.9 C)  TempSrc: Oral  SpO2: 98%  Weight: (!) 359 lb 6.4 oz (163 kg)  Height: 5\' 11"  (1.803 m)   Gen. well-built, obese lady, looks uncomfortable because of pain, no acute distress. Musculoskeletal. She has increased temperature and little more swelling around her left knee as compared to right. She has bilateral swelling just below her both knees. She has tenderness around both knees, hips, lower back and ankles. Lungs. Clear bilaterally CV.RRR, No R/M/G. Abdomen. Soft, nontender, obese, bowel sounds positive. Extremities. No pedal edema, peripheral pulse is 2+ bilaterally.  Assessment & Plan:   See Encounters Tab for problem based charting.  Patient seen with Dr. Eppie Gibson

## 2015-10-09 NOTE — Assessment & Plan Note (Signed)
She has an history of adenomatous polyp which were removed in April 2017 and she was advised to repeat colonoscopy in 3 years. She was also scheduled to have barium studies due to incomplete viewing of colon in May 2017. She never went for that appointment stating that she was not feeling well and she never rescheduled either. I advised her to follow-up with her gastroenterology for that purpose.

## 2015-10-09 NOTE — Assessment & Plan Note (Signed)
She states that lidocaine gel really helps and she needs a refill. Refill was provided.

## 2015-10-09 NOTE — Assessment & Plan Note (Signed)
Her current symptoms may be due to her fibromyalgia exacerbation. I will consider starting amitriptyline 25 mg daily at night after ruling out any acute inflammation.

## 2015-10-09 NOTE — Assessment & Plan Note (Addendum)
She states that she wants to lose weight but these pains are not letting her do so. [facility] but recently was unable to go due to increased in her pain. Her Rexulti might be a contributor. She was encouraged to continue her exercise regimen and try to eat healthy.

## 2015-10-09 NOTE — Patient Instructions (Addendum)
It was pleasure taking care of few today. As we have discussed we will does some blood workup and ultrasound of your left leg to find any cause of that pain. I am giving you Mobic for your pain and increasing Neurontin to 600 mg twice daily. I am starting a new medicine name lisinopril 10 mg daily for your increase in blood pressure. Please let us know if you experience any excessive coughing or swelling off your face and lips. Please, and see [name] again after your ultrasound.

## 2015-10-10 LAB — CBC WITH DIFFERENTIAL/PLATELET
Basophils Absolute: 0 10*3/uL (ref 0.0–0.2)
Basos: 0 %
EOS (ABSOLUTE): 0.2 10*3/uL (ref 0.0–0.4)
Eos: 3 %
HEMATOCRIT: 42.2 % (ref 34.0–46.6)
Hemoglobin: 13.5 g/dL (ref 11.1–15.9)
IMMATURE GRANULOCYTES: 0 %
Immature Grans (Abs): 0 10*3/uL (ref 0.0–0.1)
LYMPHS ABS: 3.6 10*3/uL — AB (ref 0.7–3.1)
Lymphs: 40 %
MCH: 26.3 pg — ABNORMAL LOW (ref 26.6–33.0)
MCHC: 32 g/dL (ref 31.5–35.7)
MCV: 82 fL (ref 79–97)
MONOS ABS: 0.9 10*3/uL (ref 0.1–0.9)
Monocytes: 10 %
NEUTROS PCT: 47 %
Neutrophils Absolute: 4.3 10*3/uL (ref 1.4–7.0)
PLATELETS: 375 10*3/uL (ref 150–379)
RBC: 5.14 x10E6/uL (ref 3.77–5.28)
RDW: 15.9 % — AB (ref 12.3–15.4)
WBC: 9.1 10*3/uL (ref 3.4–10.8)

## 2015-10-10 LAB — BMP8+ANION GAP
ANION GAP: 17 mmol/L (ref 10.0–18.0)
BUN / CREAT RATIO: 8 — AB (ref 9–23)
BUN: 7 mg/dL (ref 6–24)
CHLORIDE: 101 mmol/L (ref 96–106)
CO2: 21 mmol/L (ref 18–29)
Calcium: 8.9 mg/dL (ref 8.7–10.2)
Creatinine, Ser: 0.84 mg/dL (ref 0.57–1.00)
GFR calc Af Amer: 97 mL/min/{1.73_m2} (ref >59)
GFR calc non Af Amer: 84 mL/min/{1.73_m2} (ref >59)
GLUCOSE: 71 mg/dL (ref 65–99)
POTASSIUM: 4.3 mmol/L (ref 3.5–5.2)
SODIUM: 139 mmol/L (ref 134–144)

## 2015-10-10 NOTE — Progress Notes (Signed)
Patient ID: Lindsay [name], female   DOB: August 15, 1970, 45 y.o.   MRN: [id number]  I saw and evaluated the patient. I personally confirmed the key portions of Dr. Latina Craver history and exam and reviewed pertinent patient test results. The assessment, diagnosis, and plan were formulated together and I agree with the documentation in the resident's note.

## 2015-10-16 ENCOUNTER — Ambulatory Visit: Payer: Self-pay

## 2015-10-17 ENCOUNTER — Encounter: Payer: Self-pay | Admitting: Internal Medicine

## 2015-10-18 NOTE — Addendum Note (Signed)
Addended by: Colin Rhein on: 10/18/2015 03:32 PM   Modules accepted: Orders

## 2015-10-24 ENCOUNTER — Encounter (HOSPITAL_COMMUNITY): Payer: Self-pay

## 2015-10-24 ENCOUNTER — Ambulatory Visit (HOSPITAL_COMMUNITY): Payer: Medicare Other

## 2015-10-26 ENCOUNTER — Ambulatory Visit (HOSPITAL_COMMUNITY): Payer: Self-pay | Admitting: Clinical

## 2015-10-26 ENCOUNTER — Other Ambulatory Visit: Payer: Self-pay | Admitting: Internal Medicine

## 2015-10-27 ENCOUNTER — Other Ambulatory Visit: Payer: Self-pay | Admitting: *Deleted

## 2015-10-31 ENCOUNTER — Other Ambulatory Visit: Payer: Self-pay | Admitting: Internal Medicine

## 2015-10-31 ENCOUNTER — Ambulatory Visit (HOSPITAL_COMMUNITY)
Admission: RE | Admit: 2015-10-31 | Discharge: 2015-10-31 | Disposition: A | Discharge status: Home / Self Care | Payer: Medicare Other | Source: Ambulatory Visit | Attending: Internal Medicine | Admitting: Internal Medicine

## 2015-10-31 ENCOUNTER — Ambulatory Visit (HOSPITAL_BASED_OUTPATIENT_CLINIC_OR_DEPARTMENT_OTHER)
Admission: RE | Admit: 2015-10-31 | Discharge: 2015-10-31 | Disposition: A | Discharge status: Home / Self Care | Payer: Medicare Other | Source: Ambulatory Visit | Attending: Internal Medicine | Admitting: Internal Medicine

## 2015-10-31 DIAGNOSIS — M79605 Pain in left leg: Secondary | ICD-10-CM | POA: Diagnosis not present

## 2015-10-31 DIAGNOSIS — M7989 Other specified soft tissue disorders: Secondary | ICD-10-CM

## 2015-10-31 MED ORDER — PROMETHAZINE HCL 25 MG PO TABS: ORAL_TABLET | ORAL | 0 refills | Status: DC

## 2015-10-31 NOTE — Progress Notes (Addendum)
**  Preliminary report by tech**  Left lower extremity venous duplex completed. There is no evidence of deep or superficial vein thrombosis involving the left lower extremity. All visualized vessels appear patent and compressible. There is no evidence of a Baker's cyst on the left. Results given to Dr. Reesa Chew at [phone].  10/31/15 8:40 AM Carlos Levering RVT

## 2015-11-03 ENCOUNTER — Other Ambulatory Visit: Payer: Self-pay | Admitting: Internal Medicine

## 2015-11-03 DIAGNOSIS — Z1231 Encounter for screening mammogram for malignant neoplasm of breast: Secondary | ICD-10-CM

## 2015-11-06 ENCOUNTER — Ambulatory Visit (INDEPENDENT_AMBULATORY_CARE_PROVIDER_SITE_OTHER): Payer: Medicare Other | Admitting: Internal Medicine

## 2015-11-06 VITALS — BP 155/78 | HR 83 | Temp 98.5°F | Ht 71.0 in | Wt 363.0 lb

## 2015-11-06 DIAGNOSIS — M797 Fibromyalgia: Secondary | ICD-10-CM | POA: Diagnosis not present

## 2015-11-06 DIAGNOSIS — F418 Other specified anxiety disorders: Secondary | ICD-10-CM | POA: Diagnosis not present

## 2015-11-06 DIAGNOSIS — I10 Essential (primary) hypertension: Secondary | ICD-10-CM | POA: Diagnosis not present

## 2015-11-06 DIAGNOSIS — K581 Irritable bowel syndrome with constipation: Secondary | ICD-10-CM

## 2015-11-06 DIAGNOSIS — R600 Localized edema: Secondary | ICD-10-CM | POA: Diagnosis not present

## 2015-11-06 DIAGNOSIS — Z79899 Other long term (current) drug therapy: Secondary | ICD-10-CM | POA: Diagnosis not present

## 2015-11-06 DIAGNOSIS — F17210 Nicotine dependence, cigarettes, uncomplicated: Secondary | ICD-10-CM | POA: Diagnosis not present

## 2015-11-06 DIAGNOSIS — M7989 Other specified soft tissue disorders: Secondary | ICD-10-CM

## 2015-11-06 DIAGNOSIS — M2141 Flat foot [pes planus] (acquired), right foot: Secondary | ICD-10-CM | POA: Diagnosis not present

## 2015-11-06 DIAGNOSIS — M2142 Flat foot [pes planus] (acquired), left foot: Secondary | ICD-10-CM | POA: Diagnosis not present

## 2015-11-06 MED ORDER — HYDROCHLOROTHIAZIDE 12.5 MG PO TABS: 12.5000 mg | ORAL_TABLET | Freq: Every day | ORAL | 2 refills | Status: DC

## 2015-11-06 NOTE — Assessment & Plan Note (Signed)
Patient with pain in feet with radiation to knee, worsened with walking. On exam, no alarm signs are present, extremities are warm, well perfused, without lesions. She does have bilateral pes planus that is rigid and pain is reproducible with palpation.   Plan: --discussed with patient importance of arch support, supportive therapies for stretching plantar fascia

## 2015-11-06 NOTE — Progress Notes (Signed)
CC: leg pain and swelling  HPI:  [name] is a 45 y.o. with a PMH listed below presenting to clinic for continued leg pain and swelling and f/u for HTN.   Patient has had about 1 year history of bilateral leg pain radiating from feet up to knees that is worse with walking, as well as general aching at rest and with activity and swelling. Patient has tried ibuprofen 800mg  TID and naproxen 500mg  BID in the past without improvement. She was started on meloxicam 7.5mg  daily and neurontin 600mg  BID on 10/09/2015. She states she has not had any change in her pain with the addition of these medicines but that they have given her constipation. She also had a ultrasound of her left leg since at the time, it had increased swelling and warmth compared to the right leg; ultrasound was negative for DVT, and was positive for small linear inter or intramuscular collection of fluid in the left calf. Patient states she has been on an antidepressant in the past, but has had unfavorable side-effects like aggression; she does not specifically remember which SSRI's she was on in the past.  For her hypertension, she has been started on lisinopril. Since starting it, she has developed a dry cough, without shortness of breath, sputum production, or chest pain. She has not had any mucosal swelling.  Please see problem based Assessment and Plan for status of patients chronic conditions.  Past Medical History:  Diagnosis Date  . Anemia    Iron Def  . Anorexia   . Chronic kidney disease    Nephrotic syndrome  . Colon polyp 2009  . Colon polyps   . Depression   . Depression with anxiety   . Edema leg   . Hemorrhoids   . Hidradenitis suppurativa   . Hypertension   . IBS (irritable bowel syndrome)   . Low back pain   . Migraines   . Neuromuscular disorder (HCC)    fibromyalgia  . Panic attacks   . Polyp of vocal cord or larynx   . Tonsil pain     Review of Systems:   Review of Systems    Constitutional: Positive for fever (2weeks ago).  Eyes: Negative for blurred vision.  Respiratory: Positive for cough. Negative for sputum production and shortness of breath.   Cardiovascular: Positive for leg swelling. Negative for chest pain and orthopnea.  Gastrointestinal: Positive for constipation and nausea (2 weeks ago). Negative for abdominal pain, blood in stool, diarrhea, heartburn, melena and vomiting.  Genitourinary: Negative for dysuria, frequency, hematuria and urgency.  Musculoskeletal: Positive for joint pain and myalgias. Negative for falls.  Skin: Negative for rash.  Neurological: Positive for sensory change.    Physical Exam:  Vitals:   11/06/15 0818 11/06/15 0820  BP: (!) 155/78   Pulse: 83   Temp:  98.5 F (36.9 C)  TempSrc:  Oral  SpO2: 100%   Weight: (!) 363 lb (164.7 kg)   Height: 5\' 11"  (1.803 m)    Physical Exam  Constitutional: mild distress, teary at times CV: RRR, no murmurs, rubs or gallops Resp: CTAB, no wheezing or crackles, no increased work of breathing Back: no spinal tenderness; some Right sided paraspinal tendernes Abd: soft, +BS, obese, NT Ext: warm, 1+ bilateral LE pulses, 1+ pretibial and pedal pitting edema bilaterally, bilateral pes planus with extreme tenderness and reproduction of radiating pain with palpation, LE diffusely tender, range of motion decreased in ankles due to swelling, decreased sensation on medial  LLE  Assessment & Plan:   See Encounters Tab for problem based charting.   Patient seen with Dr. Abelardo Diesel, MD Internal Medicine PGY1

## 2015-11-06 NOTE — Assessment & Plan Note (Signed)
Patient with history of fibromyalgia, depression, anxiety and IBS on multiple psychiatric medications and bilateral leg pain radiating from feet to knees. Pain is consistent with fibromyalgia and can be made worse with edema as discussed in 'HTN' and 'swelling'. Patient states she has been on antidepressants in the past but has had unfavorable reactions to them. Patient has already tried and failed NSAID therapy and gabapentin; amitryptaline is a choice but is contraindicated with Buproprion  Plan: --d/c meloxicam and neurontin as they are not effective --will try and get in touch with Dr. Adele Schilder of [facility] see what medical options are available without interfering with current medical therapy of MDD and anxiety.

## 2015-11-06 NOTE — Assessment & Plan Note (Signed)
Patient with bilateral leg swelling and pain. [name] has ruled out DVT in left leg as she was having increased swelling and perhaps warmth in left leg on prior presentation. On today's evaluation, patient has significant LE pitting edema with diffuse tenderness.  Plan: --started on HCTZ 12.5mg  daily; f/u in 4 weeks for medication management

## 2015-11-06 NOTE — Assessment & Plan Note (Signed)
Patient with hypertension who was recently started on lisinopril but has since developed a cough. She also shows signs of pitting edema and would benefit from a diuretic, that will hopefully relieve some pain as well.  Plan: --stop lisinopril --start HCTZ 12.5mg  daily, f/u in 4 weeks to evaluate effectiveness; BMP on 9/11 was unremarkable

## 2015-11-06 NOTE — Patient Instructions (Addendum)
I have sent in a new blood pressure medicine to help control your high blood pressure and relieve some of the swelling in your legs.  I will get in touch with your behavioral health office and see what options we have for fibromyalgia and what you have already tried.   Since the gabapentin has not changed your pain, we will stop it for right now.   As we talked about constipation, please increase your water intake to about 4 bottles of water a day and take your stool softener daily to clear out your bowels. Once they are regulated again, I expect your bloating will get better.  Plantar Fasciitis Plantar fasciitis is a painful foot condition that affects the heel. It occurs when the band of tissue that connects the toes to the heel bone (plantar fascia) becomes irritated. This can happen after exercising too much or doing other repetitive activities (overuse injury). The pain from plantar fasciitis can range from mild irritation to severe pain that makes it difficult for you to walk or move. The pain is usually worse in the morning or after you have been sitting or lying down for a while. CAUSES This condition may be caused by:  Standing for long periods of time.  Wearing shoes that do not fit.  Doing high-impact activities, including running, aerobics, and ballet.  Being overweight.  Having an abnormal way of walking (gait).  Having tight calf muscles.  Having high arches in your feet.  Starting a new athletic activity. SYMPTOMS The main symptom of this condition is heel pain. Other symptoms include:  Pain that gets worse after activity or exercise.  Pain that is worse in the morning or after resting.  Pain that goes away after you walk for a few minutes. DIAGNOSIS This condition may be diagnosed based on your signs and symptoms. Your health care provider will also do a physical exam to check for:  A tender area on the bottom of your foot.  A high arch in your foot.  Pain  when you move your foot.  Difficulty moving your foot. You may also need to have imaging studies to confirm the diagnosis. These can include:  X-rays.  Ultrasound.  MRI. TREATMENT  Treatment for plantar fasciitis depends on the severity of the condition. Your treatment may include:  Rest, ice, and over-the-counter pain medicines to manage your pain.  Exercises to stretch your calves and your plantar fascia.  A splint that holds your foot in a stretched, upward position while you sleep (night splint).  Physical therapy to relieve symptoms and prevent problems in the future.  Cortisone injections to relieve severe pain.  Extracorporeal shock wave therapy (ESWT) to stimulate damaged plantar fascia with electrical impulses. It is often used as a last resort before surgery.  Surgery, if other treatments have not worked after 12 months. HOME CARE INSTRUCTIONS  Take medicines only as directed by your health care provider.  Avoid activities that cause pain.  Roll the bottom of your foot over a bag of ice or a bottle of cold water. Do this for 20 minutes, 3-4 times a day.  Perform simple stretches as directed by your health care provider.  Try wearing athletic shoes with air-sole or gel-sole cushions or soft shoe inserts.  Wear a night splint while sleeping, if directed by your health care provider.  Keep all follow-up appointments with your health care provider. PREVENTION   Do not perform exercises or activities that cause heel pain.  Consider  finding low-impact activities if you continue to have problems.  Lose weight if you need to. The best way to prevent plantar fasciitis is to avoid the activities that aggravate your plantar fascia. SEEK MEDICAL CARE IF:  Your symptoms do not go away after treatment with home care measures.  Your pain gets worse.  Your pain affects your ability to move or do your daily activities.   This information is not intended to replace  advice given to you by your health care provider. Make sure you discuss any questions you have with your health care provider.   Document Released: 10/09/2000 Document Revised: 10/05/2014 Document Reviewed: 11/24/2013 Elsevier Interactive Patient Education [employer].

## 2015-11-07 ENCOUNTER — Other Ambulatory Visit (HOSPITAL_COMMUNITY): Payer: Self-pay | Admitting: Psychiatry

## 2015-11-07 DIAGNOSIS — F331 Major depressive disorder, recurrent, moderate: Secondary | ICD-10-CM

## 2015-11-08 NOTE — Progress Notes (Signed)
Internal Medicine Clinic Attending  I saw and evaluated the patient.  I personally confirmed the key portions of the history and exam documented by Dr. Svalina and I reviewed pertinent patient test results.  The assessment, diagnosis, and plan were formulated together and I agree with the documentation in the resident's note.  

## 2015-11-17 ENCOUNTER — Ambulatory Visit
Admission: RE | Admit: 2015-11-17 | Discharge: 2015-11-17 | Disposition: A | Discharge status: Home / Self Care | Payer: Medicare Other | Source: Ambulatory Visit | Attending: Family Medicine | Admitting: Family Medicine

## 2015-11-17 DIAGNOSIS — Z1231 Encounter for screening mammogram for malignant neoplasm of breast: Secondary | ICD-10-CM

## 2015-11-22 ENCOUNTER — Other Ambulatory Visit: Payer: Self-pay | Admitting: *Deleted

## 2015-11-22 NOTE — Telephone Encounter (Signed)
Last filled on 10/3 #30.  If this is going to be a long-term rx please consider adding additional refills if appropriate.Lindsay [name], Lindsay [employer] Cassady10/25/201710:15 AM

## 2015-11-23 ENCOUNTER — Telehealth: Payer: Self-pay | Admitting: *Deleted

## 2015-11-23 NOTE — Telephone Encounter (Signed)
Pt calls and is mad, after calming her down she states she has "been treated like dirt", she states dr Reesa Chew stated to her when she walked in the room "you dont have any teeth, you make me feel old" she states dr Jari Favre was rude and dr Dareen Piano insinuated she is crazy. She does not like being thrown from doctor to doctor and being treated like dirt. She was informed that all patients are told that they may not be able to see their pcp at each visit and that since this is a residency program that it is impossible for the doctors to be here daily. She was ask what she would like to happen, she hesitated and said I want my medicine. She was advised that she would need to be seen and it would not be with dr's amin and svalina. appt made

## 2015-11-29 ENCOUNTER — Ambulatory Visit (INDEPENDENT_AMBULATORY_CARE_PROVIDER_SITE_OTHER): Payer: Medicare Other | Admitting: Internal Medicine

## 2015-11-29 ENCOUNTER — Encounter: Payer: Self-pay | Admitting: Internal Medicine

## 2015-11-29 VITALS — BP 152/81 | HR 89 | Temp 98.3°F | Ht 71.0 in | Wt 354.8 lb

## 2015-11-29 DIAGNOSIS — M79604 Pain in right leg: Secondary | ICD-10-CM | POA: Insufficient documentation

## 2015-11-29 DIAGNOSIS — M79605 Pain in left leg: Secondary | ICD-10-CM

## 2015-11-29 DIAGNOSIS — R51 Headache: Secondary | ICD-10-CM | POA: Diagnosis not present

## 2015-11-29 DIAGNOSIS — Z6841 Body Mass Index (BMI) 40.0 and over, adult: Secondary | ICD-10-CM

## 2015-11-29 DIAGNOSIS — Z79899 Other long term (current) drug therapy: Secondary | ICD-10-CM

## 2015-11-29 DIAGNOSIS — F39 Unspecified mood [affective] disorder: Secondary | ICD-10-CM | POA: Diagnosis not present

## 2015-11-29 DIAGNOSIS — F17210 Nicotine dependence, cigarettes, uncomplicated: Secondary | ICD-10-CM | POA: Diagnosis not present

## 2015-11-29 MED ORDER — KETOROLAC TROMETHAMINE 30 MG/ML IJ SOLN
30.0000 mg | Freq: Once | INTRAMUSCULAR | Status: AC
  Administered 2015-11-29: 30 mg via INTRAMUSCULAR

## 2015-11-29 MED ORDER — MELOXICAM 15 MG PO TABS: 15.0000 mg | ORAL_TABLET | Freq: Every day | ORAL | 0 refills | Status: DC

## 2015-11-29 MED ORDER — PROMETHAZINE HCL 25 MG PO TABS: ORAL_TABLET | ORAL | 0 refills | Status: DC

## 2015-11-29 MED ORDER — KETOROLAC TROMETHAMINE 30 MG/ML IJ SOLN: 30.0000 mg | Freq: Once | INTRAMUSCULAR | Status: DC

## 2015-11-29 NOTE — Progress Notes (Signed)
   CC: "Pain in my legs"  HPI:  [name] is a 45 y.o. female who presents today for "pain in my legs." Please see assessment & plan for status of chronic medical problems.   Past Medical History:  Diagnosis Date  . Anemia    Iron Def  . Anorexia   . Chronic kidney disease    Nephrotic syndrome  . Colon polyp 2009  . Colon polyps   . Depression   . Depression with anxiety   . Edema leg   . Hemorrhoids   . Hidradenitis suppurativa   . Hypertension   . IBS (irritable bowel syndrome)   . Low back pain   . Migraines   . Neuromuscular disorder (HCC)    fibromyalgia  . Panic attacks   . Polyp of vocal cord or larynx   . Tonsil pain     Review of Systems:  Please see each problem below for a pertinent review of systems.  Physical Exam:  Vitals:   11/29/15 0847  BP: (!) 152/81  Pulse: 89  Temp: 98.3 F (36.8 C)  TempSrc: Oral  SpO2: 98%  Weight: (!) 354 lb 12.8 oz (160.9 kg)  Height: 5\' 11"  (1.803 m)   Physical Exam  Constitutional: No distress.  HENT:  Head: Normocephalic and atraumatic.  Eyes: Conjunctivae are normal. No scleral icterus.  Pulmonary/Chest: No respiratory distress.  Musculoskeletal:  Tenderness to palpation overlying bilateral lower extremities. No calf tenderness noted bilaterally. 1+ pitting edema bilaterally.  Skin: She is not diaphoretic.     Assessment & Plan:   See Encounters Tab for problem based charting.  Patient discussed with Dr. Beryle Beams

## 2015-11-29 NOTE — Patient Instructions (Addendum)
With regards to your leg pain, it's safe to say that you do not have blood clots or problems with your circulation.  For your headaches, there are medications we can try but need to have input from the mental health doctor.   Please see your PCP, Dr. Reesa Chew, back in 1 month to discuss.

## 2015-11-29 NOTE — Assessment & Plan Note (Addendum)
Assessment Social office visit here, she continues to complain of bilateral leg pain. She felt some relief with meloxicam 7.5 mg daily that was unable to take it for the last 5 days because she ran out of her nausea medication. Her pain has been persistent over the last year and is particular worse when she tries to get out of bed. She also has difficulty with ambulation. She does complain of numbness and tingling of both feet bilaterally with radiation of pain upwards. She describes her pain as "stretching and pulling, like working out more than your body can tolerate."  Review prior workup is unremarkable for thromboembolic disease as well as peripheral vascular disease which could very well masquerade in her current presentation. The complaints of her feet pain suggest neuropathy though she is not diabetic and has been worked up for this in the past as well. Review of her most recent CT abdominal imaging in June 2016 was notable for mild degenerative changes of the lumbar and lower thoracic spine which are expected given her morbid obesity. Certainly, her symptoms could be associated with fibromyalgia though it would be a diagnosis of exclusion.  Given her comorbid mood disorder, I wonder if duloxetine may be helpful though would need clearance from her psychiatrist.  Plan -Give ketorolac 30 mg IM today -Prescribed meloxicam 15 mg 30 days and promethazine 25 mg 30 days. I advised her that these medication should be short-term and not long-term to which she expressed understanding. -Speak with her psychiatrist about duloxetine -Follow-up with PCP in one month

## 2015-11-30 ENCOUNTER — Encounter (HOSPITAL_COMMUNITY): Payer: Self-pay | Admitting: Psychiatry

## 2015-11-30 ENCOUNTER — Ambulatory Visit (INDEPENDENT_AMBULATORY_CARE_PROVIDER_SITE_OTHER): Payer: Medicare Other | Admitting: Psychiatry

## 2015-11-30 DIAGNOSIS — F331 Major depressive disorder, recurrent, moderate: Secondary | ICD-10-CM | POA: Diagnosis not present

## 2015-11-30 MED ORDER — CLORAZEPATE DIPOTASSIUM 15 MG PO TABS: ORAL_TABLET | ORAL | 2 refills | Status: DC

## 2015-11-30 MED ORDER — BREXPIPRAZOLE 1 MG PO TABS: 1.0000 | ORAL_TABLET | Freq: Every day | ORAL | 2 refills | Status: DC

## 2015-11-30 MED ORDER — HYDROXYZINE PAMOATE 25 MG PO CAPS: ORAL_CAPSULE | ORAL | 2 refills | Status: DC

## 2015-11-30 MED ORDER — BUPROPION HCL ER (XL) 300 MG PO TB24: 300.0000 mg | ORAL_TABLET | Freq: Every morning | ORAL | 2 refills | Status: DC
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
## 2015-11-30 NOTE — Progress Notes (Signed)
Medicine attending: Medical history, presenting problems, physical findings, and medications, reviewed with resident physician Dr Rushil Patel on the day of the patient visit and I concur with his evaluation and management plan. 

## 2015-12-26 ENCOUNTER — Other Ambulatory Visit: Payer: Self-pay | Admitting: Internal Medicine

## 2015-12-26 DIAGNOSIS — M79604 Pain in right leg: Secondary | ICD-10-CM

## 2015-12-26 DIAGNOSIS — M79605 Pain in left leg: Principal | ICD-10-CM

## 2015-12-26 NOTE — Telephone Encounter (Signed)
Per Dr. Serita Grit note (11/29/15) promethazine was meant to be a short term prescription. It is documented that she voiced understand at that time. Refill declined - we will discuss at her upcoming appointment on 01/08/2016.

## 2015-12-27 ENCOUNTER — Encounter: Payer: Self-pay | Admitting: Internal Medicine

## 2015-12-27 DIAGNOSIS — N921 Excessive and frequent menstruation with irregular cycle: Secondary | ICD-10-CM

## 2015-12-27 NOTE — Telephone Encounter (Signed)
Patient called back @ 4:37p.m. Requesting her Phenergan until her appt on 01/08/2016.  Explained to patient that the office was closed and that she will get a call back on tomorrow in reference to this message.

## 2015-12-28 MED ORDER — PROMETHAZINE HCL 25 MG PO TABS: ORAL_TABLET | ORAL | 0 refills | Status: DC

## 2015-12-28 NOTE — Telephone Encounter (Signed)
I had lm for pt to rtc, I called again early this am and got her, the "rod" in her arm is a birth control device that [facility] was advised to call them and see about a f/u appt, could you place a referral in case she needs one? She is scheduled in [facility] for tues 12/5 for her nausea and f/u from her 11/1 appt.

## 2015-12-28 NOTE — Telephone Encounter (Addendum)
Called and talked to pt - informed Dr Philipp Ovens had refill Phenergan; given her enough med to last until her appt on 12/11. Pt voiced understanding.

## 2015-12-28 NOTE — Telephone Encounter (Signed)
Pt stated it was explained at her visit w/Dr Posey Pronto that he would prescribe enough Phenergan until her appt "with my new doctor". Stated she will run out of med by tomorrow (Dec 1). Told her I would relay her message to Dr Philipp Ovens.

## 2015-12-28 NOTE — Telephone Encounter (Signed)
Patient requesting refill of her phenergan to make it to her scheduled appointment on 01/08/2016. I will approve a short supply (15 tabs) to last her the next two weeks. We will discuss continued use of this medication at her up coming appointment.

## 2016-01-01 ENCOUNTER — Ambulatory Visit (INDEPENDENT_AMBULATORY_CARE_PROVIDER_SITE_OTHER): Payer: Medicare Other | Admitting: Clinical

## 2016-01-01 DIAGNOSIS — F411 Generalized anxiety disorder: Secondary | ICD-10-CM

## 2016-01-01 DIAGNOSIS — F410 Panic disorder [episodic paroxysmal anxiety] without agoraphobia: Secondary | ICD-10-CM | POA: Diagnosis not present

## 2016-01-01 DIAGNOSIS — F331 Major depressive disorder, recurrent, moderate: Secondary | ICD-10-CM | POA: Diagnosis not present

## 2016-01-01 NOTE — Progress Notes (Signed)
THERAPIST PROGRESS NOTE  Session Time: 9:03 - 10:00   Participation Level: Active  Behavioral Response: CasualAlertDepressed  Type of Therapy: Individual Therapy  Treatment Goals addressed: improve psychiatric symptoms,  healthy coping skills (self-care), emotional regulations skills  Interventions: CBT, Motivational Interviewing   Summary: Lindsay [name] is a 45 y.o. female who presents with Major Depressive Disorder, and Generalized Anxiety, panic disorder without agoraphobia  Suicidal/Homicidal: No    -   without intent/plan  Therapist Response:  Lindsay [name] met with clinician for an individual session. Lindsay [name] discussed her psychiatric symptoms and her current life events. Lindsay [name] reports that she has been depressed lately. She shared that her depression has been increased because she is also having a lot of physical pain. Lindsay [name] shared that it was her birthday a few days ago but that she did not enjoy herself. She shared that her symptoms of depression are also increased due to family issues and her "role" of being a care taker. Clinician asked open ended questions and Lindsay [name] shared about her desire to have more time for herself to do things she would like. Lindsay [name] and clinician discussed using a schedule to map her time so that she could schedule time slots to do the things she would like to do. Lindsay [name] also shared that one of the things that keeps her isolating is how others react to her outside the home. She shared that others make negative comments about her looks , size and darkness of her skin. She reports that this has happened since she was in elementary school. Lindsay [name] and clinician discussed her beliefs about herself. Lindsay [name] and clinician discussed how it affects her mood (depression and isolation) . Lindsay [name] and clinician discussed how to begin to challenge the belief. Lindsay [name] and clinician agreed to discuss it further at future session.   Plan: Return again in 2  weeks.  Diagnosis: Axis I: Major Depressive Disorder, and Generalized Anxiety, panic disorder without agoraphobia    Lindsay [name] A, LCSW 01/01/2016

## 2016-01-02 ENCOUNTER — Ambulatory Visit: Payer: Self-pay

## 2016-01-03 ENCOUNTER — Encounter (HOSPITAL_COMMUNITY): Payer: Self-pay | Admitting: Clinical

## 2016-01-05 ENCOUNTER — Telehealth: Payer: Self-pay | Admitting: Internal Medicine

## 2016-01-05 NOTE — Telephone Encounter (Signed)
APT. REMINDER CALL, LMTCB °

## 2016-01-08 ENCOUNTER — Ambulatory Visit (INDEPENDENT_AMBULATORY_CARE_PROVIDER_SITE_OTHER): Payer: Medicare Other | Admitting: Internal Medicine

## 2016-01-08 ENCOUNTER — Encounter: Payer: Self-pay | Admitting: Internal Medicine

## 2016-01-08 VITALS — BP 141/91 | HR 83 | Temp 98.7°F | Ht 71.0 in | Wt 363.0 lb

## 2016-01-08 DIAGNOSIS — R600 Localized edema: Secondary | ICD-10-CM | POA: Diagnosis not present

## 2016-01-08 DIAGNOSIS — N920 Excessive and frequent menstruation with regular cycle: Secondary | ICD-10-CM | POA: Diagnosis not present

## 2016-01-08 DIAGNOSIS — G8929 Other chronic pain: Secondary | ICD-10-CM | POA: Diagnosis not present

## 2016-01-08 DIAGNOSIS — Z978 Presence of other specified devices: Secondary | ICD-10-CM

## 2016-01-08 DIAGNOSIS — M7989 Other specified soft tissue disorders: Secondary | ICD-10-CM

## 2016-01-08 DIAGNOSIS — F17210 Nicotine dependence, cigarettes, uncomplicated: Secondary | ICD-10-CM

## 2016-01-08 DIAGNOSIS — R6889 Other general symptoms and signs: Secondary | ICD-10-CM | POA: Diagnosis not present

## 2016-01-08 DIAGNOSIS — Z79899 Other long term (current) drug therapy: Secondary | ICD-10-CM

## 2016-01-08 DIAGNOSIS — N921 Excessive and frequent menstruation with irregular cycle: Secondary | ICD-10-CM | POA: Insufficient documentation

## 2016-01-08 DIAGNOSIS — M79605 Pain in left leg: Secondary | ICD-10-CM

## 2016-01-08 DIAGNOSIS — M79604 Pain in right leg: Secondary | ICD-10-CM | POA: Diagnosis not present

## 2016-01-08 DIAGNOSIS — Z0000 Encounter for general adult medical examination without abnormal findings: Secondary | ICD-10-CM

## 2016-01-08 DIAGNOSIS — I10 Essential (primary) hypertension: Secondary | ICD-10-CM

## 2016-01-08 DIAGNOSIS — K219 Gastro-esophageal reflux disease without esophagitis: Secondary | ICD-10-CM

## 2016-01-08 DIAGNOSIS — G43909 Migraine, unspecified, not intractable, without status migrainosus: Secondary | ICD-10-CM

## 2016-01-08 LAB — GLUCOSE, CAPILLARY: Glucose-Capillary: 94 mg/dL (ref 65–99)

## 2016-01-08 LAB — POCT GLYCOSYLATED HEMOGLOBIN (HGB A1C): HEMOGLOBIN A1C: 5.7

## 2016-01-08 MED ORDER — OMEPRAZOLE 40 MG PO CPDR: 40.0000 mg | DELAYED_RELEASE_CAPSULE | Freq: Every day | ORAL | 11 refills | Status: DC

## 2016-01-08 MED ORDER — MELOXICAM 15 MG PO TABS: 15.0000 mg | ORAL_TABLET | Freq: Every day | ORAL | 2 refills | Status: DC

## 2016-01-08 MED ORDER — PROMETHAZINE HCL 25 MG PO TABS: ORAL_TABLET | ORAL | 0 refills | Status: DC

## 2016-01-08 MED ORDER — HYDROCHLOROTHIAZIDE 12.5 MG PO TABS: 12.5000 mg | ORAL_TABLET | Freq: Every day | ORAL | 2 refills | Status: DC

## 2016-01-08 NOTE — Assessment & Plan Note (Addendum)
Patient reports progressive worsening of her bilateral leg pain in the past year. Description of pain is concerning for claudication vs. Neuropathy. She complains of a pins and needles sensation in her bilateral toes as well as throbbing pain below the knees bilaterally that is worse with exertion. Risk factors for PVD include HTN and obesity. Distal pulses on exam are 1+ bilaterally. She is not a diabetic and her last A1C was 5.3 in June of 2016. Patient struggles with depression and she reports that her leg pain is limiting her ability to function and making her more depressed.  -- ABIs for vascular work up -- A1C, TSH, B12, HIV, and RPR for neuropathy work-up. If negative may consider nerve conduction studies.  -- Follow up in 3 months

## 2016-01-08 NOTE — Assessment & Plan Note (Signed)
Started on HCTZ 12.5 at her last visit for worsening lower extremity edema. Somewhat improved today per patient. BP today 141/91. Will recheck at her next visit.  -- Continue HCTZ 12.5 mg daily -- Follow up 3 months

## 2016-01-08 NOTE — Progress Notes (Signed)
   CC: Leg pain  HPI:  [name] is a 45 y.o. F here for follow up of her bilateral leg pain. She reports the pain started about one year ago and has progressively worsened. She describes the pain as numbness and tingling in her toes with aching and throbbing pain (that is sometimes sharp) below the knees bilaterally. Pain is worse with activity and exertion. She was started on HCTZ at her last visit for lower extremity edema which has helped somewhat. She also complaining of heavy menstrual flow with cramps and nausea. She had a birth control implant placed in her arm by her gynecologist about one year ago. She began having issue with her menstrual cycle in October and has been bleeding ever since. She was prescribed phenergan at a prior appointment for the nausea which helps and she is requesting a refill. She is also requesting a refill of her meloxicam for her migraines and another referral to the headache clinic. She was previously seen by Dr. Domingo Cocking at the [facility] but missed an appointment and was told she needed another referral.   Past Medical History:  Diagnosis Date  . Anemia    Iron Def  . Anorexia   . Chronic kidney disease    Nephrotic syndrome  . Colon polyp 2009  . Colon polyps   . Depression   . Depression with anxiety   . Edema leg   . Hemorrhoids   . Hidradenitis suppurativa   . Hypertension   . IBS (irritable bowel syndrome)   . Low back pain   . Migraines   . Neuromuscular disorder (HCC)    fibromyalgia  . Panic attacks   . Polyp of vocal cord or larynx   . Tonsil pain     Review of Systems:  All pertinents listed in HPI, otherwise negative   Physical Exam:  Vitals:   01/08/16 1320  BP: (!) 141/91  Pulse: 83  Temp: 98.7 F (37.1 C)  TempSrc: Oral  SpO2: 99%  Weight: (!) 363 lb (164.7 kg)  Height: 5\' 11"  (1.803 m)   Physical Exam Constitutional: Obese, appears comfortable Cardiovascular: Ascultation limited due to body  habitus but RRR, no murmurs, rubs, or gallops.  Pulmonary/Chest: CTAB, no wheezes, rales, or rhonchi. No chest wall abnormalities.  Abdominal: Soft, non tender, non distended. +BS.  Extremities: 2+ pitting edema to mid shins bilaterally. RLE slightly cooler to touch than LLE. Distal pulses 1+ bilaterally.  Neurological: A&Ox3, CN II - XII grossly intact.  Skin: No rashes or erythema  Psychiatric: Normal mood and affect  Assessment & Plan:   See Encounters Tab for problem based charting.  Patient seen with Dr. Evette Doffing

## 2016-01-08 NOTE — Assessment & Plan Note (Signed)
Patient reports abnormal uterine bleeding that has been continuous since October. Bleeding is associated with cramps and nausea. She has a birth control implant in her arm that was placed by her gynecologist one year ago. She was prescribed phenergan one month ago for the nausea. Dr. Posey Pronto discussed with the patient at that time that phenergan was not meant to be a long term medication. Patient called prior to her appointment requesting a refill and I initially declined. She called back asking for a refill just to last her until she could be seen in our clinic due to her severe uncontrolled nausea. I approved a short 2 week course. Today she is again requesting a refill of the medication. I talked with patient about needing to follow up with her gynecologist to have her nexplanon removed. I placed a referral prior to her appointment but she said she had not yet been contacted. Placed a second referral today. I approved one more refill of her phenergan for her nausea but stressed the importance of following up with her gynecologist to have the implant removed. Will  -- Phenergan 25 mg q6 hr (#30 tabs) refilled once  -- Gynecology referral

## 2016-01-08 NOTE — Patient Instructions (Signed)
I have refilled your medications and sent them to your pharmacy. I will call you with the results of your blood work today. Please follow up in 3 months for your leg pain. Please call your gynecologist to schedule an appointment to have your birth control implant removed. If you have any questions or concerns, call our clinic at [phone] or after hours call [phone] and ask for the internal medicine resident on call. Thank you!

## 2016-01-08 NOTE — Addendum Note (Signed)
Addended by: Hulan Fray on: 01/08/2016 05:49 PM   Modules accepted: Orders

## 2016-01-08 NOTE — Assessment & Plan Note (Signed)
Patient reports a history of migraines. Well controlled with meloxicam. Previously seen at the [facility] by Dr. Domingo Cocking but missed an appointment and is requesting another referral.  -- Refilled meloxicam 15 mg daily prn for headaches -- Referred to [facility]

## 2016-01-08 NOTE — Assessment & Plan Note (Addendum)
Patient has 2+ lower extremity edema on exam. She was started on HCTZ at her last visit with some improvement in the swelling. She does not have a history of heart failure and previous lower extremity dopplers were negative for DVTs.  -- Will check urine protein/creatinine ratio -- If negative may consider getting an ECHO

## 2016-01-09 LAB — VITAMIN B12: VITAMIN B 12: 288 pg/mL (ref 232–1245)

## 2016-01-09 LAB — TSH: TSH: 1.14 u[IU]/mL (ref 0.450–4.500)

## 2016-01-09 LAB — MICROALBUMIN / CREATININE URINE RATIO
Creatinine, Urine: 186.6 mg/dL
Microalb/Creat Ratio: 22 mg/g creat (ref 0.0–30.0)
Microalbumin, Urine: 41 ug/mL

## 2016-01-09 LAB — RPR: RPR Ser Ql: NONREACTIVE

## 2016-01-09 LAB — HIV ANTIBODY (ROUTINE TESTING W REFLEX): HIV SCREEN 4TH GENERATION: NONREACTIVE

## 2016-01-09 NOTE — Progress Notes (Signed)
Internal Medicine Clinic Attending  I saw and evaluated the patient.  I personally confirmed the key portions of the history and exam documented by Dr. Guilloud and I reviewed pertinent patient test results.  The assessment, diagnosis, and plan were formulated together and I agree with the documentation in the resident's note.  

## 2016-01-12 ENCOUNTER — Encounter: Payer: Self-pay | Admitting: Internal Medicine

## 2016-01-12 ENCOUNTER — Other Ambulatory Visit: Payer: Self-pay | Admitting: Internal Medicine

## 2016-01-16 ENCOUNTER — Encounter: Payer: Self-pay | Admitting: Internal Medicine

## 2016-01-18 ENCOUNTER — Telehealth: Payer: Self-pay | Admitting: *Deleted

## 2016-01-18 NOTE — Telephone Encounter (Signed)
PATIENT WAS CONTACTED WITH THIS APPOINTMENT FOR GYN : January 9.018 @ 2:00PM TO ARRIVE 1:45PM. PATIENT WAS INSTRUCTED TO CALL THEIR OFFICE AT [id number] IF THIS APPOINTMENT WAS NOT A GOOD TIME.

## 2016-02-01 ENCOUNTER — Ambulatory Visit (HOSPITAL_COMMUNITY): Payer: Self-pay | Admitting: Clinical

## 2016-02-02 ENCOUNTER — Other Ambulatory Visit: Payer: Self-pay | Admitting: Internal Medicine

## 2016-02-02 DIAGNOSIS — M79604 Pain in right leg: Secondary | ICD-10-CM

## 2016-02-02 DIAGNOSIS — M79605 Pain in left leg: Principal | ICD-10-CM

## 2016-02-02 NOTE — Telephone Encounter (Signed)
Patient is requesting a refill of her Phenergan again. I suspect her nausea is a side effect from her birth control implant. Per chart review, patient has an appointment scheduled on 02/06/16 with gynecology to have it removed. Will provide her with a short course (10 tabs) to last until that appointment.

## 2016-02-05 DIAGNOSIS — H40013 Open angle with borderline findings, low risk, bilateral: Secondary | ICD-10-CM | POA: Diagnosis not present

## 2016-02-06 ENCOUNTER — Encounter: Payer: Self-pay | Admitting: Medical

## 2016-02-14 ENCOUNTER — Other Ambulatory Visit (HOSPITAL_COMMUNITY): Payer: Self-pay | Admitting: Psychiatry

## 2016-02-14 DIAGNOSIS — F331 Major depressive disorder, recurrent, moderate: Secondary | ICD-10-CM

## 2016-02-16 ENCOUNTER — Encounter: Payer: Self-pay | Admitting: Internal Medicine

## 2016-02-21 ENCOUNTER — Ambulatory Visit (INDEPENDENT_AMBULATORY_CARE_PROVIDER_SITE_OTHER): Payer: Medicare Other | Admitting: Neurology

## 2016-02-21 ENCOUNTER — Encounter: Payer: Self-pay | Admitting: Neurology

## 2016-02-21 DIAGNOSIS — M79605 Pain in left leg: Secondary | ICD-10-CM

## 2016-02-21 DIAGNOSIS — M79604 Pain in right leg: Secondary | ICD-10-CM | POA: Diagnosis not present

## 2016-02-21 DIAGNOSIS — E538 Deficiency of other specified B group vitamins: Secondary | ICD-10-CM | POA: Diagnosis not present

## 2016-02-21 MED ORDER — DULOXETINE HCL 30 MG PO CPEP: 30.0000 mg | ORAL_CAPSULE | Freq: Two times a day (BID) | ORAL | 3 refills | Status: DC

## 2016-02-21 NOTE — Progress Notes (Signed)
Reason for visit: Leg discomfort  Referring physician: Dr. Margretta Ditty is a 46 y.o. female  History of present illness:  Lindsay [name] is a 46 year old right-handed black female with a history of morbid obesity and a history of chronic daily headaches. The patient is followed by Dr. Domingo Cocking from the [facility] for the headaches. The patient is sent to this office for evaluation of back pain and bilateral lower extremity discomfort. The patient claims that the symptoms began around June 2016. The patient began having discomfort in the feet and she was seen by Dr. Jacqualyn Posey from podiatry. The patient eventually began developing tingling in the feet and toes and the tingling sensations have ascended both legs across the ankle on the right, and up to the knee level on the left. The patient also reports inability to fully extend the left knee, with pain in the left knee with weightbearing, and pain in both ankles with weightbearing. The patient weighs greater than 350 pounds. The patient does report some low back pain, she has pain radiating from the back down the left leg, not down the right leg. The patient believes that there is some weakness of the legs. She denies issues controlling the bowels or the bladder. She has tingling in the hands bilaterally, she indicates that nerve conduction studies done previously have shown bilateral carpal tunnel syndrome. The patient has a cold sensation in the feet, she has difficulty sleeping at night because of this. She is on gabapentin without full control of the neuropathy type pain. She is sent to this office for further evaluation. She denies a history of diabetes.   Past Medical History:  Diagnosis Date  . Acid reflux   . Anemia    Iron Def  . Anorexia   . Chronic kidney disease    Nephrotic syndrome  . Colon polyp 2009  . Depression with anxiety   . Edema leg   . Fibromyalgia   . Hemorrhoids   . Hidradenitis suppurativa   .  Hypertension   . IBS (irritable bowel syndrome)   . Low back pain   . Migraines   . Morbidly obese ([place])   . Neuromuscular disorder ([facility])    fibromyalgia  . Neuropathy ([place])   . Panic attacks   . Polyp of vocal cord or larynx   . Tonsil pain     Past Surgical History:  Procedure Laterality Date  . AXILLARY HIDRADENITIS EXCISION    . COLONOSCOPY WITH PROPOFOL N/A 05/25/2015   Procedure: COLONOSCOPY WITH PROPOFOL;  Surgeon: Milus Banister, MD;  Location: [facility];  Service: Endoscopy;  Laterality: N/A;  . HEMORRHOID SURGERY     with Hidradenitis surgery   . INGUINAL HIDRADENITIS EXCISION    . TONSILLECTOMY  10/18/2010   by Dr. Wilburn Cornelia  . UPPER GASTROINTESTINAL ENDOSCOPY      Family History  Problem Relation Age of Onset  . Diabetes Mother   . Heart disease Mother     valve leak  . Anxiety disorder Mother   . Depression Mother   . High blood pressure Mother   . Kidney failure Mother   . Cancer Father     prostate  . Heart disease Father   . Learning disabilities Sister   . Depression Sister   . Depression Sister   . Anxiety disorder Sister   . Colon cancer Neg Hx     Social history:  reports that she has been smoking Cigarettes.  She  has a 12.50 pack-year smoking history. She has never used smokeless tobacco. She reports that she does not drink alcohol or use drugs.  Medications:  Prior to Admission medications   Medication Sig Start Date End Date Taking? Authorizing Provider  BIOTIN PO Take 1 capsule by mouth daily.   Yes Historical Provider, MD  Brexpiprazole (REXULTI) 1 MG TABS Take 1 tablet (1 mg total) by mouth daily. 11/30/15  Yes Kathlee Nations, MD  buPROPion (WELLBUTRIN XL) 300 MG 24 hr tablet Take 1 tablet (300 mg total) by mouth every morning. 11/30/15  Yes Kathlee Nations, MD  clorazepate (TRANXENE) 15 MG tablet TAKE 1 TABLET BY MOUTH EVERY DAY IN THE MORNING 11/30/15  Yes Kathlee Nations, MD  gabapentin (NEURONTIN) 600 MG tablet Take 1 tablet (600 mg  total) by mouth 2 (two) times daily. 10/09/15  Yes Lorella Nimrod, MD  hydrochlorothiazide (HYDRODIURIL) 12.5 MG tablet Take 1 tablet (12.5 mg total) by mouth daily. 01/08/16  Yes Velna Ochs, MD  hydrOXYzine (VISTARIL) 25 MG capsule TAKE 1 CAPSULE BY MOUTH DAILY AS NEEDED FOR ANXIETY. 02/16/16  Yes Kathlee Nations, MD  ibuprofen (ADVIL,MOTRIN) 800 MG tablet Take 1 tablet (800 mg total) by mouth every 8 (eight) hours as needed. 08/30/15  Yes Oval Linsey, MD  Lidocaine, Anorectal, 5 % GEL Apply 1 drop topically daily as needed. 10/09/15  Yes Lorella Nimrod, MD  meloxicam (MOBIC) 15 MG tablet Take 1 tablet (15 mg total) by mouth daily. 01/08/16 01/07/17 Yes Velna Ochs, MD  Multiple Vitamins-Minerals (MULTIVITAMIN PO) Take 1 capsule by mouth daily.   Yes Historical Provider, MD  omeprazole (PRILOSEC) 40 MG capsule Take 1 capsule (40 mg total) by mouth daily. 01/08/16  Yes Velna Ochs, MD  promethazine (PHENERGAN) 25 MG tablet TAKE 1 TABLET (25 MG TOTAL) BY MOUTH EVERY 6 (SIX) HOURS AS NEEDED. FOR NAUSEA 02/02/16  Yes Velna Ochs, MD      Allergies  Allergen Reactions  . Lisinopril Cough    ROS:  Out of a complete 14 system review of symptoms, the patient complains only of the following symptoms, and all other reviewed systems are negative.  Weight gain, fatigue  Swelling in the legs Birthmarks Snoring Feeling hot Joint pain, achy muscles Allergies Headache, numbness, weakness Depression, anxiety, not enough sleep, decreased energy, change in appetite, disinterest in activities Insomnia   Blood pressure (!) 151/98, pulse 87, height 5\' 11"  (1.803 m), weight (!) 358 lb (162.4 kg).  Physical Exam  General: The patient is alert and cooperative at the time of the examination.The patient is morbidly obese.  Eyes: Pupils are equal, round, and reactive to light. Discs are flat bilaterally.  Neck: The neck is supple, no carotid bruits are noted.  Respiratory: The respiratory  examination is clear.  Cardiovascular: The cardiovascular examination reveals a regular rate and rhythm, no obvious murmurs or rubs are noted.  Neuromuscular: The patient is able to flex the low back to about 80. There is good rotational movement of the low back.  Skin: Extremities are with 1+ edema of ankles bilaterally.  Neurologic Exam  Mental status: The patient is alert and oriented x 3 at the time of the examination. The patient has apparent normal recent and remote memory, with an apparently normal attention span and concentration ability.  Cranial nerves: Facial symmetry is present. There is good sensation of the face to pinprick and soft touch bilaterally. The strength of the facial muscles and the muscles to head turning and  shoulder shrug are normal bilaterally. Speech is well enunciated, no aphasia or dysarthria is noted. Extraocular movements are full. Visual fields are full. The tongue is midline, and the patient has symmetric elevation of the soft palate. No obvious hearing deficits are noted.  Motor: The motor testing reveals 5 over 5 strength of all 4 extremities. Good symmetric motor tone is noted throughout.  Sensory: Sensory testing is intact to pinprick, soft touch, vibration sensation, and position sense on the upper  extremities.With the lower extremity is, there is a stocking pattern pinprick sensory deficit about 1 [address] up the legs bilaterally. Vibration sensation is intact in both feet, position sensation is mildly reduced in both feet.  No evidence of extinction is noted.  Coordination: Cerebellar testing reveals good finger-nose-finger and heel-to-shin bilaterally.  Gait and station: Gait is normal. Tandem gait is unsteady. Romberg is negative. No drift is seen.  Reflexes: Deep tendon reflexes are symmetric, but are depressed  bilaterally. Toes are downgoing bilaterally.   Assessment/Plan:  1. Numbness in the hands and feet, possible peripheral neuropathy     2. Probable degenerative arthritic pain in the left knee and both ankles   3. Morbid obesity   4. Chronic low back pain   5. Chronic daily headache   The patient suffers from morbid obesity. She indicates that she has never been considered for bariatric surgery, this should be a consideration in the future. The patient is developing degenerative changes in the left knee and both ankles that are resulting in pain. The patient is also having tingling and dysesthesias in the feet that may represent a peripheral neuropathy. She will be set up for nerve conduction studies of both legs and one arm, she will have EMG evaluation of the left leg. Given the low back pain and pain down the left leg, we need to exclude a contribution from the lumbar spine with the lower extremity symptoms. The patient will follow-up for the EMG evaluation. Blood work will be done today. The patient will be placed on low-dose Cymbalta, if this is effective, the Wellbutrin may be discontinued and the Cymbalta increased.   Jill Alexanders MD 02/21/2016 8:35 AM  [facility] [address] [place], [place] [address]  Phone [phone] Fax [phone]

## 2016-02-21 NOTE — Patient Instructions (Signed)
   We will get blood work today and get EMG and NCV evaluation.   With the Cymbalta 30 mg, start on one tablet daily for 1 week, then take one twice a day.

## 2016-02-23 ENCOUNTER — Other Ambulatory Visit: Payer: Self-pay | Admitting: *Deleted

## 2016-02-23 ENCOUNTER — Encounter: Payer: Self-pay | Admitting: Internal Medicine

## 2016-02-23 DIAGNOSIS — M79605 Pain in left leg: Principal | ICD-10-CM

## 2016-02-23 DIAGNOSIS — M79604 Pain in right leg: Secondary | ICD-10-CM

## 2016-02-23 MED ORDER — PROMETHAZINE HCL 25 MG PO TABS: 25.0000 mg | ORAL_TABLET | Freq: Two times a day (BID) | ORAL | 0 refills | Status: DC | PRN

## 2016-02-26 LAB — MULTIPLE MYELOMA PANEL, SERUM
ALBUMIN/GLOB SERPL: 1.1 (ref 0.7–1.7)
Albumin SerPl Elph-Mcnc: 3.5 g/dL (ref 2.9–4.4)
Alpha 1: 0.3 g/dL (ref 0.0–0.4)
Alpha2 Glob SerPl Elph-Mcnc: 0.8 g/dL (ref 0.4–1.0)
B-GLOBULIN SERPL ELPH-MCNC: 1.1 g/dL (ref 0.7–1.3)
GAMMA GLOB SERPL ELPH-MCNC: 1.1 g/dL (ref 0.4–1.8)
GLOBULIN, TOTAL: 3.4 g/dL (ref 2.2–3.9)
IGA/IMMUNOGLOBULIN A, SERUM: 110 mg/dL (ref 87–352)
IgG (Immunoglobin G), Serum: 1170 mg/dL (ref 700–1600)
IgM (Immunoglobulin M), Srm: 65 mg/dL (ref 26–217)
Total Protein: 6.9 g/dL (ref 6.0–8.5)

## 2016-02-26 LAB — VITAMIN B12: Vitamin B-12: 361 pg/mL (ref 232–1245)

## 2016-02-26 LAB — B. BURGDORFI ANTIBODIES: Lyme IgG/IgM Ab: <0.91 {ISR} (ref 0.00–0.90)

## 2016-02-26 LAB — SEDIMENTATION RATE: SED RATE: 34 mm/h — AB (ref 0–32)

## 2016-02-26 LAB — ANA W/REFLEX: Anti Nuclear Antibody(ANA): NEGATIVE

## 2016-02-26 LAB — RHEUMATOID FACTOR

## 2016-02-26 LAB — ANGIOTENSIN CONVERTING ENZYME: ANGIO CONVERT ENZYME: 36 U/L (ref 14–82)

## 2016-02-28 ENCOUNTER — Other Ambulatory Visit (HOSPITAL_COMMUNITY): Payer: Self-pay | Admitting: Psychiatry

## 2016-02-28 ENCOUNTER — Telehealth: Payer: Self-pay | Admitting: Neurology

## 2016-02-28 DIAGNOSIS — F331 Major depressive disorder, recurrent, moderate: Secondary | ICD-10-CM

## 2016-02-28 MED ORDER — BREXPIPRAZOLE 1 MG PO TABS
1.0000 | ORAL_TABLET | Freq: Every day | ORAL | 0 refills | Status: DC
Start: 2016-02-28 — End: 2016-04-09

## 2016-02-28 NOTE — Telephone Encounter (Signed)
Returned call and spoke to pt. She was started on Cymbalta one 30 mg tab daily x 1 wk, then is to take one twice a day. Let her know that if this is effective, the Wellbutrin may then be discontinued and the Cymbalta increased. She verbalized understanding and appreciation for call. Agreed to keep [name] informed on toleration/benefit of new med.

## 2016-02-28 NOTE — Telephone Encounter (Signed)
Pt called, needs clarification that while taking Cymbalta she does not take Wellbutrin. Please call

## 2016-03-01 ENCOUNTER — Ambulatory Visit: Payer: Medicare Other | Admitting: Clinical

## 2016-03-01 ENCOUNTER — Ambulatory Visit (INDEPENDENT_AMBULATORY_CARE_PROVIDER_SITE_OTHER): Payer: Medicare Other | Admitting: Family Medicine

## 2016-03-01 VITALS — BP 144/98 | HR 90 | Ht 71.0 in | Wt 348.8 lb

## 2016-03-01 DIAGNOSIS — N921 Excessive and frequent menstruation with irregular cycle: Secondary | ICD-10-CM

## 2016-03-01 DIAGNOSIS — R110 Nausea: Secondary | ICD-10-CM | POA: Diagnosis not present

## 2016-03-01 DIAGNOSIS — F331 Major depressive disorder, recurrent, moderate: Secondary | ICD-10-CM

## 2016-03-01 NOTE — Patient Instructions (Addendum)
Menorrhagia Menorrhagia is the medical term for when your menstrual periods are heavy or last longer than usual. With menorrhagia, every period you have may cause enough blood loss and cramping that you are unable to maintain your usual activities. CAUSES  In some cases, the cause of heavy periods is unknown, but a number of conditions may cause menorrhagia. Common causes include:  A problem with the hormone-producing thyroid gland (hypothyroid).  Noncancerous growths in the uterus (polyps or fibroids).  An imbalance of the estrogen and progesterone hormones.  One of your ovaries not releasing an egg during one or more months.  Side effects of having an intrauterine device (IUD).  Side effects of some medicines, such as anti-inflammatory medicines or blood thinners.  A bleeding disorder that stops your blood from clotting normally. SIGNS AND SYMPTOMS  During a normal period, bleeding lasts between 4 and 8 days. Signs that your periods are too heavy include:  You routinely have to change your pad or tampon every 1 or 2 hours because it is completely soaked.  You pass blood clots larger than 1 inch (2.5 cm) in size.  You have bleeding for more than 7 days.  You need to use pads and tampons at the same time because of heavy bleeding.  You need to wake up to change your pads or tampons during the night.  You have symptoms of anemia, such as tiredness, fatigue, or shortness of breath. DIAGNOSIS  Your health care provider will perform a physical exam and ask you questions about your symptoms and menstrual history. Other tests may be ordered based on what the health care provider finds during the exam. These tests can include:  Blood tests. Blood tests are used to check if you are pregnant or have hormonal changes, a bleeding or thyroid disorder, low iron levels (anemia), or other problems.  Endometrial biopsy. Your health care provider takes a sample of tissue from the inside of your  uterus to be examined under a microscope.  Pelvic ultrasound. This test uses sound waves to make a picture of your uterus, ovaries, and vagina. The pictures can show if you have fibroids or other growths.  Hysteroscopy. For this test, your health care provider will use a small telescope to look inside your uterus. Based on the results of your initial tests, your health care provider may recommend further testing. TREATMENT  Treatment may not be needed. If it is needed, your health care provider may recommend treatment with one or more medicines first. If these do not reduce bleeding enough, a surgical treatment might be an option. The best treatment for you will depend on:   Whether you need to prevent pregnancy.  Your desire to have children in the future.  The cause and severity of your bleeding.  Your opinion and personal preference.  Medicines for menorrhagia may include:  Birth control methods that use hormones. These include the pill, skin patch, vaginal ring, shots that you get every 3 months, hormonal IUD, and implant. These treatments reduce bleeding during your menstrual period.  Medicines that thicken blood and slow bleeding.  Medicines that reduce swelling, such as ibuprofen.  Medicines that contain a synthetic hormone called progestin.   Medicines that make the ovaries stop working for a short time.  You may need surgical treatment for menorrhagia if the medicines are unsuccessful. Treatment options include:  Dilation and curettage (D&C). In this procedure, your health care provider opens (dilates) your cervix and then scrapes or suctions tissue from   the lining of your uterus to reduce menstrual bleeding.  Operative hysteroscopy. This procedure uses a tiny tube with a light (hysteroscope) to view your uterine cavity and can help in the surgical removal of a polyp that may be causing heavy periods.  Endometrial ablation. Through various techniques, your health care  provider permanently destroys the entire lining of your uterus (endometrium). After endometrial ablation, most women have little or no menstrual flow. Endometrial ablation reduces your ability to become pregnant.  Endometrial resection. This surgical procedure uses an electrosurgical wire loop to remove the lining of the uterus. This procedure also reduces your ability to become pregnant.  Hysterectomy. Surgical removal of the uterus and cervix is a permanent procedure that stops menstrual periods. Pregnancy is not possible after a hysterectomy. This procedure requires anesthesia and hospitalization. HOME CARE INSTRUCTIONS   Only take over-the-counter or prescription medicines as directed by your health care provider. Take prescribed medicines exactly as directed. Do not change or switch medicines without consulting your health care provider.  Take any prescribed iron pills exactly as directed by your health care provider. Long-term heavy bleeding may result in low iron levels. Iron pills help replace the iron your body lost from heavy bleeding. Iron may cause constipation. If this becomes a problem, increase the bran, fruits, and roughage in your diet.  Do not take aspirin or medicines that contain aspirin 1 week before or during your menstrual period. Aspirin may make the bleeding worse.  If you need to change your sanitary pad or tampon more than once every 2 hours, stay in bed and rest as much as possible until the bleeding stops.  Eat well-balanced meals. Eat foods high in iron. Examples are leafy green vegetables, meat, liver, eggs, and whole grain breads and cereals. Do not try to lose weight until the abnormal bleeding has stopped and your blood iron level is back to normal. SEEK MEDICAL CARE IF:   You soak through a pad or tampon every 1 or 2 hours, and this happens every time you have a period.  You need to use pads and tampons at the same time because you are bleeding so much.  You  need to change your pad or tampon during the night.  You have a period that lasts for more than 8 days.  You pass clots bigger than 1 inch wide.  You have irregular periods that happen more or less often than once a month.  You feel dizzy or faint.  You feel very weak or tired.  You feel short of breath or feel your heart is beating too fast when you exercise.  You have nausea and vomiting or diarrhea while you are taking your medicine.  You have any problems that may be related to the medicine you are taking. SEEK IMMEDIATE MEDICAL CARE IF:   You soak through 4 or more pads or tampons in 2 hours.  You have any bleeding while you are pregnant. MAKE SURE YOU:   Understand these instructions.  Will watch your condition.  Will get help right away if you are not doing well or get worse. This information is not intended to replace advice given to you by your health care provider. Make sure you discuss any questions you have with your health care provider. Document Released: 01/14/2005 Document Revised: 05/08/2015 Document Reviewed: 07/05/2012 Elsevier Interactive Patient Education  2017 Elsevier Inc.  Nausea, Adult Introduction Feeling sick to your stomach (nausea) means that your stomach is upset or you feel  like you have to throw up (vomit). Feeling sick to your stomach is usually not serious, but it may be an early sign of a more serious medical problem. As you feel sicker to your stomach, it can lead to throwing up (vomiting). If you throw up, or if you are not able to drink enough fluids, there is a risk of dehydration. Dehydration can make you feel tired and thirsty, have a dry mouth, and pee (urinate) less often. Older adults and people who have other diseases or a weak defense (immune) system have a higher risk of dehydration. The main goal of treating this condition is to:  Limit how often you feel sick to your stomach.  Prevent throwing up and dehydration. Follow these  instructions at home: Follow instructions from your doctor about how to care for yourself at home. Eating and drinking Follow these recommendations as told by your doctor:  Take an oral rehydration solution (ORS). This is a drink that is sold at pharmacies and stores.  Drink clear fluids in small amounts as you are able, such as:  Water.  Ice chips.  Fruit juice that has water added (diluted fruit juice).  Low-calorie sports drinks.  Eat bland, easy to digest foods in small amounts as you are able, such as:  Bananas.  Applesauce.  Rice.  Lean meats.  Toast.  Crackers.  Avoid drinking fluids that contain a lot of sugar or caffeine.  Avoid alcohol.  Avoid spicy or fatty foods.  Try SeaBands  Try Ginger root General instructions  Drink enough fluid to keep your pee (urine) clear or pale yellow.  Wash your hands often. If you cannot use soap and water, use hand sanitizer.  Make sure that all people in your household wash their hands well and often.  Rest at home while you get better.  Take over-the-counter and prescription medicines only as told by your doctor.  Breathe slowly and deeply when you feel sick to your stomach.  Watch your condition for any changes.  Keep all follow-up visits as told by your doctor. This is important. Contact a doctor if:  You have a headache.  You have new symptoms.  You feel sicker to your stomach.  You have a fever.  You feel light-headed or dizzy.  You throw up.  You are not able to keep fluids down. Get help right away if:  You have pain in your chest, neck, arm, or jaw.  You feel very weak or you pass out (faint).  You have throw up that is bright red or looks like coffee grounds.  You have bloody or black poop (stools), or poop that looks like tar.  You have a very bad headache, a stiff neck, or both.  You have very bad pain, cramping, or bloating in your belly.  You have a rash.  You have trouble  breathing or you are breathing very quickly.  Your heart is beating very quickly.  Your skin feels cold and clammy.  You feel confused.  You have pain while peeing.  You have signs of dehydration, such as:  Dark pee, or very little or no pee.  Cracked lips.  Dry mouth.  Sunken eyes.  Sleepiness.  Weakness. These symptoms may be an emergency. Do not wait to see if the symptoms will go away. Get medical help right away. Call your local emergency services (911 in the [place] Do not drive yourself to the hospital.  This information is not intended to replace advice  given to you by your health care provider. Make sure you discuss any questions you have with your health care provider. Document Released: 01/03/2011 Document Revised: 06/22/2015 Document Reviewed: 09/20/2014  2017 Elsevier

## 2016-03-01 NOTE — Progress Notes (Signed)
Pt has nexplanon in arm. Her pcp wants her to have it removed due to prolonged menstrual bleeding, however patient does not want it taken out.

## 2016-03-01 NOTE — Progress Notes (Signed)
    Subjective:    Patient ID: Dajsha Turk is a 46 y.o. female presenting with Menstrual Problem  on 03/01/2016  HPI: Patient with on Mirena x 7 days. Removed in 03/2014 and placed Nexplanon. Initially had no cycle. Has had more abnormal bleeding with prolonged bleeding. Patient is referred for removal due to needing phenergan too often. She does have irregular bleeding but her nausea occurs all the time, with or without her cycle. It is worse after eating and she has h/o w/u for gall stones including hepatobiliary scan which elicited pain during the excretion phase. She would like to keep her Nexplanon. She is not anemic. Irregular bleeding is normal with this contraceptive.  Review of Systems  Constitutional: Negative for chills and fever.  Respiratory: Negative for shortness of breath.   Cardiovascular: Negative for chest pain.  Gastrointestinal: Positive for nausea. Negative for abdominal pain and vomiting.  Genitourinary: Positive for vaginal bleeding. Negative for dysuria.  Musculoskeletal: Positive for arthralgias, joint swelling and myalgias.  Skin: Negative for rash.  Neurological: Positive for numbness.      Objective:    BP (!) 144/98   Pulse 90   Ht 5\' 11"  (1.803 m)   Wt (!) 348 lb 12.8 oz (158.2 kg)   BMI 48.65 kg/m  Physical Exam  Constitutional: She is oriented to person, place, and time. She appears well-developed and well-nourished. No distress.  HENT:  Head: Normocephalic and atraumatic.  Eyes: No scleral icterus.  Neck: Neck supple.  Cardiovascular: Normal rate.   Pulmonary/Chest: Effort normal.  Abdominal: Soft.  Neurological: She is alert and oriented to person, place, and time.  Skin: Skin is warm and dry.  Nexplanon easily palpated in LUE  Psychiatric: She has a normal mood and affect.        Assessment & Plan:   Problem List Items Addressed This Visit      Unprioritized   Nausea in adult    Unclear etiology--? Gall bladder disease vs.  Gastroparesis. Unlikely related to her Nexplanon and not associated with menses. Further w/u is left to her primary MD. She will try ginger root and Sea Bands to avoid Phenergan overuse.      Menorrhagia - Primary    Not bleeding heavily, and no anemia, and bleeding does not bother the patient. She wants to keep it in. Will allow--needs to be changed in 3/19 and would need pap at that time as well.         Total face-to-face time with patient: 25 minutes. Over 50% of encounter was spent on counseling and coordination of care. Return in about 6 months (around 08/29/2016) for repeat pap.  Donnamae Jude 03/01/2016 8:33 AM

## 2016-03-01 NOTE — BH Specialist Note (Signed)
Session Start time: 8:15   End Time: 8:25 Total Time:  10 minutes Type of Service: [facility] Interpreter: No.   Interpreter Name & Language: n/a # [facility] Visits July 2017-June 2018: 1st  SUBJECTIVE: Lindsay [name] is a 46 y.o. female  Pt. was referred by Dr Kennon Rounds for:  depression. Pt. reports the following symptoms/concerns: Pt states that she is being treated at [facility] via therapy and psychiatry; attributes depression to daily physical pain. Pt copes by listening to soothing sounds(ocean, rain, etc.). Duration of problem:  Undetermined number of years Severity: Moderately severe Previous treatment: Currently in treatment   OBJECTIVE: Mood: Appropriate & Affect: Appropriate Risk of harm to self or others: No known risk of harm to self or others Assessments administered: PHQ9: 18/ GAD7: 8  LIFE CONTEXT:  Family & Social: - (Who,family proximity, relationship, friends) Higher education careers adviser Work: - (Where, how often, or financial support) Self-Care: Soothing sounds for self-care, therapy Life changes: - What is important to pt/family (values): Coping with pain  GOALS ADDRESSED:  -Reduce symptoms of depression  INTERVENTIONS: Supportive   ASSESSMENT:  Pt currently experiencing Major depressive disorder, .  Pt may benefit from psychoeducation and supportive counseling regarding coping with symptoms of depression.   PLAN: 1. F/U with behavioral health clinician: As needed 2. Behavioral Health meds: Wellbutrin, Cymbalta 3. Behavioral recommendations:  -Continue with regular therapy sessions at [facility] with psychiatry appointments at [facility] -Consider reading educational material regarding coping with symptoms of depression -Consider additional apps, discussed in office visit, for additional self-care strategy 4. Referral: Psychoeducation and Supportive Counseling 5. From scale of 1-10, how likely are you to follow plan: [place]:   Warm Hand Off Completed.        Depression screen [facility] 2/9 03/01/2016 01/08/2016 11/29/2015 11/06/2015 10/09/2015  Decreased Interest 3 3 - 3 0  Down, Depressed, Hopeless 1 3 - 3 3  PHQ - 2 Score 4 6 - 6 3  Altered sleeping 3 3 - 3 -  Tired, decreased energy 3 3 - 3 -  Change in appetite 3 3 - 3 -  Feeling bad or failure about yourself  1 3 - 2 -  Trouble concentrating 2 2 - 3 -  Moving slowly or fidgety/restless 2 3 - 3 -  Suicidal thoughts 0 0 - 0 -  PHQ-9 Score 18 23 - 23 -  Difficult doing work/chores - Extremely dIfficult Extremely dIfficult Extremely dIfficult -  Some encounter information is confidential and restricted. Go to Review Flowsheets activity to see all data.  Some recent data might be hidden   GAD 7 : Generalized Anxiety Score 03/01/2016  Nervous, Anxious, on Edge 1  Control/stop worrying 1  Worry too much - different things 2  Trouble relaxing 3  Restless 0  Easily annoyed or irritable 0  Afraid - awful might happen 1  Total GAD 7 Score 8

## 2016-03-01 NOTE — Assessment & Plan Note (Signed)
Unclear etiology--? Gall bladder disease vs. Gastroparesis. Unlikely related to her Nexplanon and not associated with menses. Further w/u is left to her primary MD. She will try ginger root and Sea Bands to avoid Phenergan overuse.

## 2016-03-01 NOTE — Assessment & Plan Note (Signed)
Not bleeding heavily, and no anemia, and bleeding does not bother the patient. She wants to keep it in. Will allow--needs to be changed in 3/19 and would need pap at that time as well.

## 2016-03-05 ENCOUNTER — Ambulatory Visit (HOSPITAL_COMMUNITY): Payer: Self-pay | Admitting: Psychiatry

## 2016-03-18 ENCOUNTER — Encounter: Payer: Self-pay | Admitting: Internal Medicine

## 2016-03-21 ENCOUNTER — Telehealth: Payer: Self-pay | Admitting: *Deleted

## 2016-03-21 MED ORDER — DULOXETINE HCL 60 MG PO CPEP: 60.0000 mg | ORAL_CAPSULE | Freq: Two times a day (BID) | ORAL | 5 refills | Status: DC

## 2016-03-21 NOTE — Telephone Encounter (Signed)
I called patient. She is doing well with Cymbalta taking 30 mg twice daily. We will go to 90 mg daily for 2 weeks, then go to 60 mg twice daily.  Will send in a prescription for the Cymbalta 60 mg.

## 2016-03-21 NOTE — Telephone Encounter (Signed)
Called and spoke to pt. Advised we received fax request to send in 90 day supply cymbalta 1 capsule po 2x/day. Advised per CW,MD last OV note, he spoke with her about increasing the dose more if she tolerated medication well.  She stated she is doing well on cymbalta. At first, she thought it made her sleepy but doing better on it now. She is interested in increasing the dose if possible and ok with CW,MD.  Advised I will send message to CW,MD to see if ok. May just send month supply to make sure she tolerates increase in dose. She is ok with this.   Reminded/verified appt for EMG/NCS on 04/01/16.

## 2016-03-22 ENCOUNTER — Other Ambulatory Visit (HOSPITAL_COMMUNITY): Payer: Self-pay | Admitting: Psychiatry

## 2016-03-22 DIAGNOSIS — F331 Major depressive disorder, recurrent, moderate: Secondary | ICD-10-CM

## 2016-03-26 ENCOUNTER — Ambulatory Visit (INDEPENDENT_AMBULATORY_CARE_PROVIDER_SITE_OTHER): Payer: Medicare Other | Admitting: Psychiatry

## 2016-03-26 ENCOUNTER — Other Ambulatory Visit (HOSPITAL_COMMUNITY): Payer: Self-pay

## 2016-03-26 ENCOUNTER — Encounter (HOSPITAL_COMMUNITY): Payer: Self-pay | Admitting: Psychiatry

## 2016-03-26 DIAGNOSIS — Z810 Family history of intellectual disabilities: Secondary | ICD-10-CM | POA: Diagnosis not present

## 2016-03-26 DIAGNOSIS — Z79899 Other long term (current) drug therapy: Secondary | ICD-10-CM | POA: Diagnosis not present

## 2016-03-26 DIAGNOSIS — Z818 Family history of other mental and behavioral disorders: Secondary | ICD-10-CM

## 2016-03-26 DIAGNOSIS — Z841 Family history of disorders of kidney and ureter: Secondary | ICD-10-CM

## 2016-03-26 DIAGNOSIS — Z8042 Family history of malignant neoplasm of prostate: Secondary | ICD-10-CM | POA: Diagnosis not present

## 2016-03-26 DIAGNOSIS — F17210 Nicotine dependence, cigarettes, uncomplicated: Secondary | ICD-10-CM

## 2016-03-26 DIAGNOSIS — F331 Major depressive disorder, recurrent, moderate: Secondary | ICD-10-CM

## 2016-03-26 DIAGNOSIS — Z833 Family history of diabetes mellitus: Secondary | ICD-10-CM

## 2016-03-26 DIAGNOSIS — Z8249 Family history of ischemic heart disease and other diseases of the circulatory system: Secondary | ICD-10-CM | POA: Diagnosis not present

## 2016-03-26 DIAGNOSIS — Z9889 Other specified postprocedural states: Secondary | ICD-10-CM | POA: Diagnosis not present

## 2016-03-26 MED ORDER — HYDROXYZINE PAMOATE 25 MG PO CAPS: ORAL_CAPSULE | ORAL | 0 refills | Status: DC

## 2016-03-26 MED ORDER — CLORAZEPATE DIPOTASSIUM 15 MG PO TABS: ORAL_TABLET | ORAL | 2 refills | Status: DC
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
## 2016-03-28 ENCOUNTER — Other Ambulatory Visit: Payer: Self-pay | Admitting: Internal Medicine

## 2016-03-28 ENCOUNTER — Other Ambulatory Visit (HOSPITAL_COMMUNITY): Payer: Self-pay | Admitting: Psychiatry

## 2016-03-28 DIAGNOSIS — I10 Essential (primary) hypertension: Secondary | ICD-10-CM

## 2016-03-28 DIAGNOSIS — M79604 Pain in right leg: Secondary | ICD-10-CM

## 2016-03-28 DIAGNOSIS — M7989 Other specified soft tissue disorders: Secondary | ICD-10-CM

## 2016-03-28 DIAGNOSIS — F331 Major depressive disorder, recurrent, moderate: Secondary | ICD-10-CM

## 2016-03-28 DIAGNOSIS — M79605 Pain in left leg: Secondary | ICD-10-CM

## 2016-04-01 ENCOUNTER — Encounter: Payer: Self-pay | Admitting: Neurology

## 2016-04-01 ENCOUNTER — Ambulatory Visit (INDEPENDENT_AMBULATORY_CARE_PROVIDER_SITE_OTHER): Payer: Self-pay | Admitting: Neurology

## 2016-04-01 ENCOUNTER — Ambulatory Visit (INDEPENDENT_AMBULATORY_CARE_PROVIDER_SITE_OTHER): Payer: Medicare Other | Admitting: Neurology

## 2016-04-01 DIAGNOSIS — M79604 Pain in right leg: Secondary | ICD-10-CM

## 2016-04-01 DIAGNOSIS — M79605 Pain in left leg: Secondary | ICD-10-CM

## 2016-04-01 NOTE — Procedures (Signed)
     HISTORY:  Lindsay [name] is a 46 year old patient with a history of morbid obesity who complains of bilateral knee and ankle discomfort, she has some back pain with pain down the left leg. The patient is being evaluated for these issues.   NERVE CONDUCTION STUDIES:  Nerve conduction studies were performed on the right upper extremity. The distal motor latency for the right median nerve was prolonged, with a normal motor amplitude. The distal motor latency and motor amplitudes for the right ulnar nerve was normal. The F wave latencies for the right median and ulnar nerves were normal, with normal nerve conduction velocities seen for these nerves. The sensory latencies for the right median nerve was prolonged, slightly prolonged as well for the right ulnar nerve.  Nerve conduction studies were performed on both lower extremities. The distal motor latency for the peroneal nerves were normal on the right with a normal motor amplitude and normal nerve conduction velocity. The left peroneal nerve was unobtainable. The posterior tibial nerves were unobtainable on the right, and there was prolongation of the distal motor latency on the left with a normal motor amplitude and normal nerve conduction velocity for this nerve. The sensory latencies for the peroneal nerves were unobtainable bilaterally, the H reflex latencies were unobtainable bilaterally.  EMG STUDIES:  EMG study was performed on the left lower extremity:  The tibialis anterior muscle reveals 2 to 4K motor units with full recruitment. No fibrillations or positive waves were seen. The peroneus tertius muscle reveals 2 to 4K motor units with full recruitment. No fibrillations or positive waves were seen. The medial gastrocnemius muscle reveals 1 to 3K motor units with full recruitment. No fibrillations or positive waves were seen. The vastus lateralis muscle reveals 2 to 4K motor units with full recruitment. No fibrillations or positive  waves were seen. The iliopsoas muscle reveals 2 to 4K motor units with full recruitment. No fibrillations or positive waves were seen. The biceps femoris muscle (long head) reveals 2 to 4K motor units with full recruitment. No fibrillations or positive waves were seen. The lumbosacral paraspinal muscles were tested at 3 levels, and revealed no abnormalities of insertional activity at all 3 levels tested. There was good relaxation.   IMPRESSION:  Nerve conduction studies done on both lower extremities and on the right upper extremity shows evidence for mild right carpal tunnel syndrome and evidence of an overlying primarily axonal peripheral neuropathy. The nerve conduction studies were technically difficult, however secondary to body habitus. EMG evaluation of the left lower extremity was completely normal, no evidence of a lumbosacral radiculopathy was seen. The EMG evaluation of the left leg suggests that if a peripheral neuropathy is present it is relatively mild.  Jill Alexanders MD 04/01/2016 1:03 PM  [facility] [address] [place], [place] [address]  Phone [phone] Fax [phone]

## 2016-04-01 NOTE — Progress Notes (Signed)
Please refer to EMG and nerve conduction study evaluation. 

## 2016-04-05 ENCOUNTER — Other Ambulatory Visit: Payer: Self-pay | Admitting: Internal Medicine

## 2016-04-09 ENCOUNTER — Other Ambulatory Visit (HOSPITAL_COMMUNITY): Payer: Self-pay | Admitting: Psychiatry

## 2016-04-09 DIAGNOSIS — F331 Major depressive disorder, recurrent, moderate: Secondary | ICD-10-CM

## 2016-04-22 ENCOUNTER — Ambulatory Visit (INDEPENDENT_AMBULATORY_CARE_PROVIDER_SITE_OTHER): Payer: Medicare Other | Admitting: Clinical

## 2016-04-22 DIAGNOSIS — F331 Major depressive disorder, recurrent, moderate: Secondary | ICD-10-CM | POA: Diagnosis not present

## 2016-04-22 DIAGNOSIS — F4310 Post-traumatic stress disorder, unspecified: Secondary | ICD-10-CM | POA: Diagnosis not present

## 2016-04-22 DIAGNOSIS — F411 Generalized anxiety disorder: Secondary | ICD-10-CM

## 2016-04-24 ENCOUNTER — Encounter (HOSPITAL_COMMUNITY): Payer: Self-pay | Admitting: Clinical
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
## 2016-04-24 NOTE — Progress Notes (Signed)
   THERAPIST PROGRESS NOTE  Session Time: 9:00 -9:54  Participation Level: Active  Behavioral Response: CasualAlertDepressed  Type of Therapy: Individual Therapy  Treatment Goals addressed: improve psychiatric symptoms, review and update assessment  Interventions: motivational interviewing  Summary: Lindsay [name] is a 46 y.o. female who presents with major depressive disorder, recurrent, moderate and generalized anxiety disorder and PTSD.   Suicidal/Homicidal: Nowithout intent/plan  Therapist Response: Lindsay [name] met with clinician for an individual session. Lindsay [name] had not been to therapy in several months. Client and clinician worked together to review and update her assessment. Clinician asked open ended questions and Lindsay [name] identified her motivations for therapy.  Plan: Return again in 1-2 weeks.  Diagnosis: Axis I: major depressive disorder, recurrent, moderate and generalized anxiety disorder and PTSD.       Rheta Hemmelgarn A, LCSW 04/24/2016

## 2016-04-25 ENCOUNTER — Encounter: Payer: Self-pay | Admitting: Internal Medicine

## 2016-04-29 ENCOUNTER — Encounter: Payer: Self-pay | Admitting: Internal Medicine

## 2016-04-29 ENCOUNTER — Telehealth: Payer: Self-pay | Admitting: *Deleted

## 2016-04-29 ENCOUNTER — Ambulatory Visit: Payer: Self-pay

## 2016-04-29 NOTE — Telephone Encounter (Signed)
Pt states she's having leg pain and "sick on my stomach"- hear gagging in the background. States having trouble keeping food down. And when she takes meds prescribed by neurologist,  she throws up b/c unable to eat.and when she eats, she throws up. States Promethazine works well but has been taken off by her doctor.. She has an appt today at 1:45 PM; she has called Medicaid transportation who she hopes will bring her. Wants to know if something could be prescribed for N/V that her insurance will cover - told I did not know but need to keep today's appt . Informed if condition worsens in meantime, go to UC or ER.

## 2016-04-29 NOTE — Telephone Encounter (Signed)
Thank you [name]... We will evaluate her in [facility] today

## 2016-05-10 ENCOUNTER — Other Ambulatory Visit (HOSPITAL_COMMUNITY): Payer: Self-pay | Admitting: Psychiatry

## 2016-05-10 DIAGNOSIS — F331 Major depressive disorder, recurrent, moderate: Secondary | ICD-10-CM

## 2016-05-13 NOTE — Telephone Encounter (Signed)
Met with Dr. Adele Schilder who approved a one time refill of patient's Rexulti 1 mg, #30 with no refills until patient returns to see him on 06/20/16.  [facility] [address] as approved by Dr. Adele Schilder.

## 2016-05-15 ENCOUNTER — Ambulatory Visit (INDEPENDENT_AMBULATORY_CARE_PROVIDER_SITE_OTHER): Payer: Medicare Other | Admitting: Clinical

## 2016-05-15 ENCOUNTER — Encounter (HOSPITAL_COMMUNITY): Payer: Self-pay | Admitting: Clinical

## 2016-05-15 DIAGNOSIS — F410 Panic disorder [episodic paroxysmal anxiety] without agoraphobia: Secondary | ICD-10-CM | POA: Diagnosis not present

## 2016-05-15 DIAGNOSIS — F331 Major depressive disorder, recurrent, moderate: Secondary | ICD-10-CM | POA: Diagnosis not present

## 2016-05-15 DIAGNOSIS — F411 Generalized anxiety disorder: Secondary | ICD-10-CM
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
## 2016-05-16 ENCOUNTER — Other Ambulatory Visit (HOSPITAL_COMMUNITY): Payer: Self-pay | Admitting: Psychiatry

## 2016-05-16 DIAGNOSIS — F331 Major depressive disorder, recurrent, moderate: Secondary | ICD-10-CM

## 2016-05-27 ENCOUNTER — Other Ambulatory Visit (HOSPITAL_COMMUNITY): Payer: Self-pay

## 2016-05-27 DIAGNOSIS — F331 Major depressive disorder, recurrent, moderate: Secondary | ICD-10-CM

## 2016-05-27 MED ORDER — HYDROXYZINE PAMOATE 25 MG PO CAPS: ORAL_CAPSULE | ORAL | 0 refills | Status: DC

## 2016-06-11 ENCOUNTER — Telehealth: Payer: Self-pay

## 2016-06-11 NOTE — Telephone Encounter (Signed)
Left MSG for appt reminder.  

## 2016-06-12 ENCOUNTER — Ambulatory Visit (INDEPENDENT_AMBULATORY_CARE_PROVIDER_SITE_OTHER): Payer: Medicare Other | Admitting: Internal Medicine

## 2016-06-12 ENCOUNTER — Other Ambulatory Visit (HOSPITAL_COMMUNITY): Payer: Self-pay | Admitting: Psychiatry

## 2016-06-12 VITALS — BP 141/76 | HR 90 | Temp 99.0°F | Wt 346.0 lb

## 2016-06-12 DIAGNOSIS — K08409 Partial loss of teeth, unspecified cause, unspecified class: Secondary | ICD-10-CM

## 2016-06-12 DIAGNOSIS — E559 Vitamin D deficiency, unspecified: Secondary | ICD-10-CM | POA: Diagnosis not present

## 2016-06-12 DIAGNOSIS — S025XXB Fracture of tooth (traumatic), initial encounter for open fracture: Secondary | ICD-10-CM | POA: Diagnosis not present

## 2016-06-12 DIAGNOSIS — X58XXXA Exposure to other specified factors, initial encounter: Secondary | ICD-10-CM

## 2016-06-12 DIAGNOSIS — R0789 Other chest pain: Secondary | ICD-10-CM | POA: Diagnosis not present

## 2016-06-12 DIAGNOSIS — B3731 Acute candidiasis of vulva and vagina: Secondary | ICD-10-CM

## 2016-06-12 DIAGNOSIS — J302 Other seasonal allergic rhinitis: Secondary | ICD-10-CM

## 2016-06-12 DIAGNOSIS — R112 Nausea with vomiting, unspecified: Secondary | ICD-10-CM | POA: Diagnosis not present

## 2016-06-12 DIAGNOSIS — F17210 Nicotine dependence, cigarettes, uncomplicated: Secondary | ICD-10-CM

## 2016-06-12 DIAGNOSIS — S025XXA Fracture of tooth (traumatic), initial encounter for closed fracture: Secondary | ICD-10-CM | POA: Insufficient documentation

## 2016-06-12 DIAGNOSIS — E611 Iron deficiency: Secondary | ICD-10-CM

## 2016-06-12 DIAGNOSIS — M79661 Pain in right lower leg: Secondary | ICD-10-CM | POA: Diagnosis not present

## 2016-06-12 DIAGNOSIS — K0889 Other specified disorders of teeth and supporting structures: Secondary | ICD-10-CM | POA: Diagnosis not present

## 2016-06-12 DIAGNOSIS — G8929 Other chronic pain: Secondary | ICD-10-CM | POA: Diagnosis not present

## 2016-06-12 DIAGNOSIS — D509 Iron deficiency anemia, unspecified: Secondary | ICD-10-CM | POA: Diagnosis not present

## 2016-06-12 DIAGNOSIS — M79605 Pain in left leg: Principal | ICD-10-CM

## 2016-06-12 DIAGNOSIS — B373 Candidiasis of vulva and vagina: Secondary | ICD-10-CM

## 2016-06-12 DIAGNOSIS — F17200 Nicotine dependence, unspecified, uncomplicated: Secondary | ICD-10-CM | POA: Insufficient documentation

## 2016-06-12 DIAGNOSIS — M79604 Pain in right leg: Secondary | ICD-10-CM | POA: Diagnosis not present

## 2016-06-12 DIAGNOSIS — J309 Allergic rhinitis, unspecified: Secondary | ICD-10-CM | POA: Insufficient documentation

## 2016-06-12 DIAGNOSIS — M79662 Pain in left lower leg: Secondary | ICD-10-CM

## 2016-06-12 DIAGNOSIS — R110 Nausea: Secondary | ICD-10-CM

## 2016-06-12 DIAGNOSIS — Z6841 Body Mass Index (BMI) 40.0 and over, adult: Secondary | ICD-10-CM

## 2016-06-12 DIAGNOSIS — F331 Major depressive disorder, recurrent, moderate: Secondary | ICD-10-CM

## 2016-06-12 MED ORDER — FLUTICASONE PROPIONATE 50 MCG/ACT NA SUSP: 2.0000 | Freq: Every day | NASAL | 2 refills | Status: DC

## 2016-06-12 MED ORDER — CYCLOBENZAPRINE HCL 5 MG PO TABS: 5.0000 mg | ORAL_TABLET | Freq: Three times a day (TID) | ORAL | 0 refills | Status: DC | PRN

## 2016-06-12 MED ORDER — PREGABALIN 100 MG PO CAPS: 100.0000 mg | ORAL_CAPSULE | Freq: Three times a day (TID) | ORAL | 2 refills | Status: DC

## 2016-06-12 MED ORDER — PROMETHAZINE HCL 25 MG PO TABS: 25.0000 mg | ORAL_TABLET | Freq: Two times a day (BID) | ORAL | 0 refills | Status: DC | PRN

## 2016-06-12 MED ORDER — AMOXICILLIN-POT CLAVULANATE 875-125 MG PO TABS: 1.0000 | ORAL_TABLET | Freq: Two times a day (BID) | ORAL | 0 refills | Status: DC

## 2016-06-12 MED ORDER — NICOTINE 7 MG/24HR TD PT24: 7.0000 mg | MEDICATED_PATCH | TRANSDERMAL | 0 refills | Status: DC

## 2016-06-12 MED ORDER — NICOTINE 14 MG/24HR TD PT24: 14.0000 mg | MEDICATED_PATCH | TRANSDERMAL | 0 refills | Status: DC

## 2016-06-12 MED ORDER — BENZOCAINE 10 % MT GEL: 1.0000 "application " | OROMUCOSAL | 0 refills | Status: DC | PRN

## 2016-06-12 MED ORDER — KETOROLAC TROMETHAMINE 30 MG/ML IJ SOLN
30.0000 mg | Freq: Once | INTRAMUSCULAR | Status: AC
  Administered 2016-06-12: 30 mg via INTRAMUSCULAR

## 2016-06-12 NOTE — Assessment & Plan Note (Signed)
Patient reports seasonal allergies and cough. She is endorsing symptoms of nasal congestion and postnasal drip.  -- Flonase daily

## 2016-06-12 NOTE — Progress Notes (Signed)
   CC: Tooth pain, cough, leg pain, rib pain   HPI:  [name] is a 46 y.o. female with past medical history outlined below here with multiple complaints of tooth pain, cough, leg pain, and rib pain. For the details of today's visit, please refer to the assessment and plan.  Past Medical History:  Diagnosis Date  . Acid reflux   . Anemia    Iron Def  . Anorexia   . Chronic kidney disease    Nephrotic syndrome  . Colon polyp 2009  . Depression with anxiety   . Edema leg   . Fibromyalgia   . Hemorrhoids   . Hidradenitis suppurativa   . Hypertension   . IBS (irritable bowel syndrome)   . Low back pain   . Migraines   . Morbidly obese ([place])   . Neuromuscular disorder (HCC)    fibromyalgia  . Neuropathy   . Panic attacks   . Polyp of vocal cord or larynx   . Tonsil pain     Review of Systems:  All pertinents listed in HPI, otherwise negative  Physical Exam:  Vitals:   06/12/16 1344  BP: (!) 141/76  Pulse: 90  Temp: 99 F (37.2 C)  SpO2: 97%  Weight: (!) 346 lb (156.9 kg)    Constitutional: Obese, NAD, appears comfortable HEENT: Multiple missing teeth, poor dentition, central incisor is open and fractured, surrounding gum is erythematous and inflamed Cardiovascular: RRR, no murmurs, rubs, or gallops.  Pulmonary/Chest: CTAB, Reproducible chest wall tenderness over left 4th rib space Abdominal: Soft, non tender, non distended. +BS.  Extremities: Warm and well perfused. No edema. Strength and sensation intact bilaterally  Neurological: A&Ox3, CN II - XII grossly intact.   Assessment & Plan:   See Encounters Tab for problem based charting.  Patient seen with Dr. Angelia Mould

## 2016-06-12 NOTE — Assessment & Plan Note (Signed)
Patient is complaining of pinpoint tenderness over one of her ribs that she feels like is consistent with a muscular strain. On exam she has reproducible chest wall tenderness over her left fourth rib space. Etiology is likely MSK secondary to her cough from allergies and post nasal drip. Patient requested a Toradol injection as this has worked well for her in the past. -- IV Toradol 30 mg x 1 today  -- Flexeril 5 mg TID prn x 5 days  -- Flonase for cough

## 2016-06-12 NOTE — Patient Instructions (Addendum)
Ms. Tuccillo,  For you dental pain, I have sent a prescription for Orajel to your pharmacy. You may use this 3 times a day as needed for pain. I have also sent a prescription for an antibiotic, Augmentin, to your pharmacy. Please take this twice a day for the next 5 days. It is important that you follow up with a dentis. For your chest wall pain, we have given you an injection of Toradol today in clinic. I have also sent a prescription for Flexeril, a muscle relaxer, to your pharmacy. You may take this 3 times a day as needed. For your cough, please use Flonase twice a day. For your nausea, I have sent a short course of Phenergan to your pharmacy. Please use this very sparingly. I have changed her gabapentin to Lyrica for your leg pain. I will call you with the results of your lab work today. I have sent prescriptions for nicotine patches to your pharmacy. Please use the 14 g patches daily for 2 weeks then switch to the lower dose 7 g patches for 2 weeks, then stop. Please do not smoke while using these patches. Please follow up with me in 1 month. If you have any questions or concerns, call our clinic at [phone] or after hours call [phone] and ask for the internal medicine resident on call. Thank you!  - Dr. Philipp Ovens

## 2016-06-12 NOTE — Assessment & Plan Note (Signed)
Patient was recently started on Cymbalta for her leg pain per neurology and her Wellbutrin was discontinued. Since stopping the Wellbutrin, her smoking has increased to 1/2 - 1 PPD. She had previously cut back to 2 cigarettes a day. Patient has tried Nicorette gum in the past without success. Today we discussed nicotine patches and patient is agreeable if covered by her insurance. Given that patient had a successful response to the Wellbutrin in the past, we may consider adding this back if the patches are ineffective. Wellbutrin should be safe to use in combination with Cymbalta. -- Nicotine patches: 14 mg patch q24 hours x 2 weeks, then 7 mg patch q24 hours x 2 weeks, then stop  -- Consider restarted wellbutrin if patches are ineffective

## 2016-06-12 NOTE — Assessment & Plan Note (Addendum)
Patient has a history of chronic bilateral leg pain. She has undergone extensive workup and etiology is unclear. She complains of a pins and needle sensation in her bilateral feet as well as throbbing pain below the knees bilaterally that is worse with exertion. Neuropathy workup thus far has remained negative (A1c, TSH, B12, HIV, and RPR). She was referred to neurology and underwent a nerve conduction study that was normal - no evidence of lumbar radiculopathy. ABIs were ordered for vasular work up unfortunately patient never got these done. My suspicion for vascular disease at this point is low. Her symptoms do not sound consistent with claudication. Unfortuantely patient is inconsistent with her pain description and is very easily to lead with questions. She is pan positive on ROS. She was recently started on Cymbalta by her neurologist and does feel that this is helping somewhat. Today she is complaining that the pain is worse at night and keeps her awake throughout the night. There could be a component of restless leg syndrome. We discussed changing her gabapentin to lyrica and patient is agreeable. We will also check for iron deficiency anemia and vitamin D deficiency has this can lead to non specific leg pain and exacerbate restless leg as well. Of note - patient is morbidly obese with a BMI of 49. I advised patient that weight loss would relieve a lot of pressure off of her legs and likely be the most beneficial thing for her leg pain. She is actively walking 30-45 a day and attempting to lose weight. I encouraged patient to keep up the good work.  -- Encouraged weight loss  -- Stop gabapentin; start Lyrica 100 mg TID  -- Ferritin, Vit D level  -- F/u 1 month   ADDENDUM: Ferritin low normal at 16, Vitamin D is low at 6.2. Called patient with results. Sent prescription for 50,000 units qweekly to her pharmacy. Will decrease to 800 units daily after 8 weeks and repeat Vit D levels.

## 2016-06-12 NOTE — Assessment & Plan Note (Signed)
Patient is complaining of dental pain today. She reports fracturing 2 of her teeth a few days ago. On exam she has very poor dentition. She is missing multiple teeth and has 2 teeth with open fractures. The surrounding gum is inflamed and erythematous. I provided a list of local dental resources that accept medicare. -- Orajel PRN for pain  -- Toradol 30 mg injection x 1 today in clinic  -- Augmentin BID x 5 days  -- F/u dentist

## 2016-06-12 NOTE — Assessment & Plan Note (Addendum)
Checking Vit D levels in the setting of her chronic, non specific leg pain.    ADDENDUM: Vitamin D is low at 6.2. Called patient with results. Sent prescription for 50,000 units qweekly to her pharmacy. Will decrease to 800 units daily after 8 weeks and repeat Vit D levels.

## 2016-06-12 NOTE — Assessment & Plan Note (Signed)
Etiology unclear but patient has chronic nausea and is requesting a refill of her Phenergan. She denies abdominal pain. No emesis. Nausea is constant and unrelated to food. A1c checked in December was 5.7. She is not diabetic and I do not feel this is consistent with gastroparesis. Patient is on multiple psychiatric medications and many medications that can potentially exacerbate nausea. Given the chronicity of her symptoms, her nausea is likely medication induced. Encouraged patient to follow up with her psychiatrist. We will need to work on narrowing down her medication list in the future. -- Refilled phenergan PRN #20 tabs, encouraged her to use sparingly.  -- Review med list -- F/u psychiatry

## 2016-06-13 LAB — VITAMIN D 25 HYDROXY (VIT D DEFICIENCY, FRACTURES): Vit D, 25-Hydroxy: 6.2 ng/mL — ABNORMAL LOW (ref 30.0–100.0)

## 2016-06-13 LAB — FERRITIN: FERRITIN: 16 ng/mL (ref 15–150)

## 2016-06-13 MED ORDER — VITAMIN D (ERGOCALCIFEROL) 1.25 MG (50000 UNIT) PO CAPS: 50000.0000 [IU] | ORAL_CAPSULE | ORAL | 0 refills | Status: DC

## 2016-06-13 NOTE — Addendum Note (Signed)
Addended by: Jodean Lima on: 06/13/2016 09:11 AM   Modules accepted: Orders

## 2016-06-14 NOTE — Progress Notes (Signed)
Internal Medicine Clinic Attending  Case discussed with Dr. Philipp Ovens at the time of the visit.  We reviewed the resident's history and exam and pertinent patient test results.  I agree with the assessment, diagnosis, and plan of care documented in the resident's note. I agree with Dr Philipp Ovens that vitamin d may be the cause, she has started treatment for this.  I would also recommend starting daily oral iron supplementation and treat to a ferritin >70.

## 2016-06-17 DIAGNOSIS — E611 Iron deficiency: Secondary | ICD-10-CM

## 2016-06-17 DIAGNOSIS — D539 Nutritional anemia, unspecified: Secondary | ICD-10-CM | POA: Insufficient documentation

## 2016-06-17 MED ORDER — FERROUS SULFATE 325 (65 FE) MG PO TABS: 325.0000 mg | ORAL_TABLET | Freq: Every day | ORAL | 3 refills | Status: DC

## 2016-06-17 MED ORDER — FLUCONAZOLE 150 MG PO TABS: 150.0000 mg | ORAL_TABLET | ORAL | 0 refills | Status: DC | PRN

## 2016-06-17 NOTE — Telephone Encounter (Signed)
Medication refill request - [facility] [address] [facility] a [facility] of her prescribed Rexulti, last filled 05/13/16 + 0. Pt. canceled appt. 06/18/16 and rescheduled for 06/20/16.

## 2016-06-17 NOTE — Assessment & Plan Note (Addendum)
Called patient with labs results today and she is complaining of a vaginal yeast infection, requesting diflucan. She gets vaginal yeast infections often and is well aware of the symptoms. She is having excessive white discharge and vaginal itching.  -- Diflucan 150 mg q72 hours x 2 tablets  -- Instructed patient to return to clinic for evaluation if symptoms do not resolve with diflucan

## 2016-06-17 NOTE — Assessment & Plan Note (Signed)
Ferritin low at 16. Called in iron supplements to her pharmacy. -- Ferrous sulfate 325 mg daily

## 2016-06-17 NOTE — Addendum Note (Signed)
Addended by: Jodean Lima on: 06/17/2016 10:07 AM   Modules accepted: Orders

## 2016-06-18 ENCOUNTER — Other Ambulatory Visit (HOSPITAL_COMMUNITY): Payer: Self-pay

## 2016-06-18 ENCOUNTER — Ambulatory Visit (HOSPITAL_COMMUNITY): Payer: Medicare Other | Admitting: Psychiatry

## 2016-06-18 DIAGNOSIS — F331 Major depressive disorder, recurrent, moderate: Secondary | ICD-10-CM

## 2016-06-18 MED ORDER — BREXPIPRAZOLE 1 MG PO TABS: 1.0000 | ORAL_TABLET | Freq: Every day | ORAL | 0 refills | Status: DC

## 2016-06-18 NOTE — Progress Notes (Signed)
Patient called and is out of Rexulti, per protocol I sent a 30 day supply to pharmacy, patient follows up Thursday

## 2016-06-20 ENCOUNTER — Ambulatory Visit (HOSPITAL_COMMUNITY): Payer: Medicare Other | Admitting: Psychiatry

## 2016-06-26 ENCOUNTER — Other Ambulatory Visit: Payer: Self-pay | Admitting: Internal Medicine

## 2016-06-26 DIAGNOSIS — I10 Essential (primary) hypertension: Secondary | ICD-10-CM

## 2016-06-26 DIAGNOSIS — M79604 Pain in right leg: Secondary | ICD-10-CM

## 2016-06-26 DIAGNOSIS — M79605 Pain in left leg: Principal | ICD-10-CM

## 2016-06-26 DIAGNOSIS — M7989 Other specified soft tissue disorders: Secondary | ICD-10-CM

## 2016-06-30 ENCOUNTER — Other Ambulatory Visit (HOSPITAL_COMMUNITY): Payer: Self-pay | Admitting: Psychiatry

## 2016-06-30 DIAGNOSIS — F331 Major depressive disorder, recurrent, moderate: Secondary | ICD-10-CM

## 2016-07-16 ENCOUNTER — Other Ambulatory Visit (HOSPITAL_COMMUNITY): Payer: Self-pay | Admitting: Psychiatry

## 2016-07-16 DIAGNOSIS — F331 Major depressive disorder, recurrent, moderate: Secondary | ICD-10-CM

## 2016-07-23 ENCOUNTER — Telehealth: Payer: Self-pay

## 2016-07-23 DIAGNOSIS — M79605 Pain in left leg: Principal | ICD-10-CM

## 2016-07-23 DIAGNOSIS — M79604 Pain in right leg: Secondary | ICD-10-CM

## 2016-07-23 NOTE — Telephone Encounter (Signed)
Fax from pharmacy: alternative requested on Lyrica 100 mg insurance requiring new hard copy for Dr. Roosvelt Harps number please send new Rx if appropriate

## 2016-07-26 ENCOUNTER — Other Ambulatory Visit (HOSPITAL_COMMUNITY): Payer: Self-pay | Admitting: Psychiatry

## 2016-07-26 ENCOUNTER — Other Ambulatory Visit (HOSPITAL_COMMUNITY): Payer: Self-pay

## 2016-07-26 DIAGNOSIS — F331 Major depressive disorder, recurrent, moderate: Secondary | ICD-10-CM

## 2016-07-26 MED ORDER — CLORAZEPATE DIPOTASSIUM 15 MG PO TABS: ORAL_TABLET | ORAL | 0 refills | Status: DC

## 2016-07-26 MED ORDER — BREXPIPRAZOLE 1 MG PO TABS: 1.0000 | ORAL_TABLET | Freq: Every day | ORAL | 0 refills | Status: DC

## 2016-07-26 MED ORDER — PREGABALIN 100 MG PO CAPS: 100.0000 mg | ORAL_CAPSULE | Freq: Three times a day (TID) | ORAL | 2 refills | Status: DC

## 2016-07-26 NOTE — Telephone Encounter (Signed)
Lyrica called in to [facility]

## 2016-07-30 ENCOUNTER — Other Ambulatory Visit: Payer: Self-pay

## 2016-07-30 DIAGNOSIS — E611 Iron deficiency: Secondary | ICD-10-CM

## 2016-07-30 MED ORDER — FERROUS SULFATE 325 (65 FE) MG PO TABS: 325.0000 mg | ORAL_TABLET | Freq: Every day | ORAL | 3 refills | Status: DC

## 2016-08-13 ENCOUNTER — Encounter (HOSPITAL_COMMUNITY): Payer: Self-pay | Admitting: Psychiatry

## 2016-08-13 ENCOUNTER — Ambulatory Visit (INDEPENDENT_AMBULATORY_CARE_PROVIDER_SITE_OTHER): Payer: Medicare Other | Admitting: Psychiatry

## 2016-08-13 DIAGNOSIS — F419 Anxiety disorder, unspecified: Secondary | ICD-10-CM | POA: Diagnosis not present

## 2016-08-13 DIAGNOSIS — F17210 Nicotine dependence, cigarettes, uncomplicated: Secondary | ICD-10-CM

## 2016-08-13 DIAGNOSIS — F331 Major depressive disorder, recurrent, moderate: Secondary | ICD-10-CM | POA: Diagnosis not present

## 2016-08-13 MED ORDER — CLORAZEPATE DIPOTASSIUM 15 MG PO TABS: ORAL_TABLET | ORAL | 2 refills | Status: DC

## 2016-08-13 MED ORDER — BREXPIPRAZOLE 1 MG PO TABS: 1.0000 | ORAL_TABLET | Freq: Every day | ORAL | 2 refills | Status: DC

## 2016-08-13 MED ORDER — HYDROXYZINE PAMOATE 50 MG PO CAPS: ORAL_CAPSULE | ORAL | 2 refills | Status: DC
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
## 2016-09-27 ENCOUNTER — Ambulatory Visit (INDEPENDENT_AMBULATORY_CARE_PROVIDER_SITE_OTHER): Payer: Medicare Other | Admitting: Internal Medicine

## 2016-09-27 VITALS — BP 155/95 | HR 83 | Temp 98.2°F | Ht 71.0 in | Wt 336.0 lb

## 2016-09-27 DIAGNOSIS — M1711 Unilateral primary osteoarthritis, right knee: Secondary | ICD-10-CM

## 2016-09-27 DIAGNOSIS — M7989 Other specified soft tissue disorders: Secondary | ICD-10-CM

## 2016-09-27 DIAGNOSIS — M25561 Pain in right knee: Secondary | ICD-10-CM

## 2016-09-27 DIAGNOSIS — S025XXB Fracture of tooth (traumatic), initial encounter for open fracture: Secondary | ICD-10-CM

## 2016-09-27 DIAGNOSIS — K0381 Cracked tooth: Secondary | ICD-10-CM

## 2016-09-27 MED ORDER — BENZOCAINE 10 % MT GEL: 1.0000 "application " | OROMUCOSAL | 0 refills | Status: DC | PRN

## 2016-09-27 NOTE — Assessment & Plan Note (Addendum)
Patient presents with complaint of left lower extremity swelling and pain that is chronic in nature and is associated with numbness and tingling. Pain is constant and located on lateral aspect of L calf. It radiates up to mid L thigh. She has tried Tylenol, Ibuprofen, Naproxen and ice with no relief. She has also tried cymbalta and gabapentin with no improvement in symptoms.  Movement aggravates the pain. She also endorses morning stiffness that persists throughout the day. Denies prolonged immobilization and recent long distance travel. Denies recent trauma, but bruising appreciated on exam. Patient has had extensive workup that has been unrevealing (A1c, TSH, B12, HIV, RPR, and nerve conduction studies). She was last seen 06/12/2016 and found to be vitamin D deficient with Vit D 6.2. She was started on D supplementation which she remains compliant with. On exam, there is significant swelling on the lateral aspect of left calf that feels warm to the touch. There is no erythema, induration, or fluctuance. Bruising noted on the same area, but no tenderness to palpation. [name] of LLE unrevealing. Initially concern for DVT however unlikely given chronicity, location of swelling, absence of risk factors and calf tenderness, and no findings on imaging.   - Continue vitamin D supplementation - Continue her Lyrica100 mg TID - Patient has follow appt with PCP 9/24

## 2016-09-27 NOTE — Progress Notes (Signed)
   CC: Right knee pain   HPI:  [name] is a 46 y.o. female with PMH listed below who presents to clinic for R knee pain. Please see problem based assessment and plan for further details.   Past Medical History:  Diagnosis Date  . Acid reflux   . Anemia    Iron Def  . Anorexia   . Chronic kidney disease    Nephrotic syndrome  . Colon polyp 2009  . Depression with anxiety   . Edema leg   . Fibromyalgia   . Hemorrhoids   . Hidradenitis suppurativa   . Hypertension   . IBS (irritable bowel syndrome)   . Low back pain   . Migraines   . Morbidly obese ([place])   . Neuromuscular disorder (HCC)    fibromyalgia  . Neuropathy   . Panic attacks   . Polyp of vocal cord or larynx   . Tonsil pain    Review of Systems:   Review of Systems  Constitutional: Negative for chills and fever.  Cardiovascular: Positive for leg swelling.  Musculoskeletal: Positive for joint pain. Negative for back pain, falls and myalgias.  Skin: Negative for rash.  Neurological: Positive for sensory change. Negative for focal weakness.    Physical Exam:  Vitals:   09/27/16 0948  BP: (!) 155/95  Pulse: 83  Temp: 98.2 F (36.8 C)  TempSrc: Oral  SpO2: 100%  Weight: (!) 336 lb (152.4 kg)  Height: 5\' 11"  (1.803 m)   General: obese female, well-developed, in pain but in no acute distress  Mouth: very poor dentition, remnant of fractured central incisor noted in tooth socket, no gingivitis observed  Neuro: limited examination secondary to severe pain and tenderness, 5/5 strength on both lower extremities  LLE: there is an area of swelling on the posterolateral aspect of LLE below the knee that feels warm to the touch, several hyperpigmented lesions noted in the same area consistent with bruising, no erythema, induration, or fluctuance noted. Crepitus noted on active and passive range of motion RLE: examination is limited by severe tenderness to palpation on lateral aspect of R knee. No erythema,  warmth, induration or fluctuance noted. Crepitus noted on active and passive range of motion  Ext: warm and well perfused, mild pitting edema up to ankles     Assessment & Plan:   See Encounters Tab for problem based charting.  Patient seen with Dr. Angelia Mould   Knee Injection Note  Pre-operative Diagnosis: right    Indications: Pain secondary to osteoarthritis of the right knee   Anesthesia: Lidocaine 1% without epinephrine without added sodium bicarbonate  Procedure Details   Point of care ultrasound was used to identify knee structures and evaluate for joint effusion. Consent was obtained for the procedure. The lateral aspect of right knee was prepped with iodine and injected with 2 mL 1% lidocaine and 1 mL of triamcinolone (KENALOG) 40mg /ml using a 22 gauge needle.  The needle was removed and a dressing was applied.  Complications:  None; patient tolerated the procedure well.

## 2016-09-27 NOTE — Assessment & Plan Note (Signed)
Patient complains of central incisor tooth pain. She has very poor dentition on exam and remnant of fractured tooth noted inside tooth socket. No gingival erythema or inflammation or other signs of infection appreciate on exam.   - Orajel 10% mucosal gel for pain  - Recommended follow up with dentist

## 2016-09-27 NOTE — Assessment & Plan Note (Addendum)
Patient presents with complaint of non-radiating, constant pain in the R knee that started 3 days ago. Denies progressive worsening. States pain is present all day and she has been unable to identify alleviating factors. Has tried Tylenol, Ibuprofen, Naproxen, and ice with no improvement in pain. Movement aggravates the pain. Denies past and recent trauma. No recent illnesses. On exam she is exquisitely tender to palpation over R knee, but no erythema, warmth, induration or fluctuance noted. [name] of R knee performed today unrevealing. No knee imaging available on chart to review. Suspect pain is due osteoarthritis secondary to obesity. BMI 47. Do not suspect ligament or meniscal injury given no recent trauma. She does not have risk factors or signs/symptoms of infection concerning for septic arthritis.   - Intra-articular steroid injection of R knee  - Patient instructed to call [facility] clinic or go to the ED if she develops new fever, swelling, or worsening pain of R knee - Patient has scheduled appt with PCP 9/24

## 2016-09-27 NOTE — Patient Instructions (Addendum)
It was nice to meet you today, Lindsay [name].    You received a steroid injection today for the pain in your right knee. Some times it can take a few days to notice improvement in pain.   Please follow up with your primary care doctor on 10/21/2016.   Please the internal medicine clinic or go the ED if you start to experience fever, swelling of the right knee, or worsening pain in the next days.

## 2016-09-30 NOTE — Progress Notes (Signed)
Internal Medicine Clinic Attending  I saw and evaluated the patient.  I personally confirmed the key portions of the history and exam documented by Dr. Isac Sarna and I reviewed pertinent patient test results.  The assessment, diagnosis, and plan were formulated together and I agree with the documentation in the resident's note. I was present for the entire procedure, however to correct the procedure note:the needle size was 25 guage 1.5 inch.

## 2016-10-03 ENCOUNTER — Other Ambulatory Visit (HOSPITAL_COMMUNITY): Payer: Self-pay | Admitting: Psychiatry

## 2016-10-03 DIAGNOSIS — F331 Major depressive disorder, recurrent, moderate: Secondary | ICD-10-CM

## 2016-10-18 ENCOUNTER — Other Ambulatory Visit (HOSPITAL_COMMUNITY): Payer: Self-pay | Admitting: Psychiatry

## 2016-10-18 ENCOUNTER — Other Ambulatory Visit: Payer: Self-pay | Admitting: Internal Medicine

## 2016-10-18 DIAGNOSIS — M79604 Pain in right leg: Secondary | ICD-10-CM

## 2016-10-18 DIAGNOSIS — F331 Major depressive disorder, recurrent, moderate: Secondary | ICD-10-CM

## 2016-10-18 DIAGNOSIS — M79605 Pain in left leg: Principal | ICD-10-CM

## 2016-10-21 ENCOUNTER — Other Ambulatory Visit: Payer: Self-pay | Admitting: Neurology

## 2016-10-21 ENCOUNTER — Encounter: Payer: Self-pay | Admitting: Internal Medicine

## 2016-10-23 ENCOUNTER — Telehealth (HOSPITAL_COMMUNITY): Payer: Self-pay

## 2016-10-23 DIAGNOSIS — F331 Major depressive disorder, recurrent, moderate: Secondary | ICD-10-CM

## 2016-10-23 NOTE — Telephone Encounter (Signed)
Medication management - Telephone call with Caryl Pina, pharmacist at [facility] about patient's requested Tanxene refill to inform pt. was given a prescription on 08/13/16 + 2 refills. Pharmacist stated this had not been brought in so would call pt. for order.  Patient to call our office back if problems locating her prescription.

## 2016-10-24 NOTE — Telephone Encounter (Signed)
Medication problem - Patient called back stating she did not have the order from 08/13/16. Stated not sure if given to pharmacy but reports she is not overtaking.  Checked with Caryl Pina, pharmacist at [facility] who verified they had no new order.  Caryl Pina, pharmacist reported patient last had an order filled there on 07/07/16 and 08/06/16 from a June prescription but no other orders on file.  Patient requests Dr. Adele Schilder rewrite or authorize a refill and agreed to send request to him.  Patient set to return 11/13/16.

## 2016-10-24 NOTE — Telephone Encounter (Signed)
She was given a hard copy, not clear if she lost but if she has not filled the prescription we can provide 30 day supply.

## 2016-10-25 ENCOUNTER — Other Ambulatory Visit (HOSPITAL_COMMUNITY): Payer: Self-pay | Admitting: Psychiatry

## 2016-10-25 DIAGNOSIS — F331 Major depressive disorder, recurrent, moderate: Secondary | ICD-10-CM

## 2016-10-25 MED ORDER — CLORAZEPATE DIPOTASSIUM 15 MG PO TABS: ORAL_TABLET | ORAL | 0 refills | Status: DC

## 2016-10-25 NOTE — Telephone Encounter (Signed)
Medication management - Telephone call with Lindsay [name], pharmacist at [facility] on [address] who verified pt has not filled Tranxens since 08/06/16 and provided one time new order per Dr. Adele Schilder verbal approval with no refills.   Called patient back to inform the one time order was approved and called into her pharmacy and reminded patient of need to keep appointment set with Dr. Adele Schilder 11/13/16 for any further orders.

## 2016-10-25 NOTE — Addendum Note (Signed)
Addended by: Watt Climes on: 10/25/2016 01:11 PM   Modules accepted: Orders

## 2016-11-04 ENCOUNTER — Ambulatory Visit (INDEPENDENT_AMBULATORY_CARE_PROVIDER_SITE_OTHER): Payer: Medicare Other | Admitting: Internal Medicine

## 2016-11-04 ENCOUNTER — Encounter: Payer: Self-pay | Admitting: Internal Medicine

## 2016-11-04 VITALS — BP 165/99 | HR 73 | Temp 98.3°F | Wt 337.4 lb

## 2016-11-04 DIAGNOSIS — M79605 Pain in left leg: Secondary | ICD-10-CM | POA: Diagnosis not present

## 2016-11-04 DIAGNOSIS — G8929 Other chronic pain: Secondary | ICD-10-CM

## 2016-11-04 DIAGNOSIS — E611 Iron deficiency: Secondary | ICD-10-CM | POA: Diagnosis not present

## 2016-11-04 DIAGNOSIS — J309 Allergic rhinitis, unspecified: Secondary | ICD-10-CM

## 2016-11-04 DIAGNOSIS — D509 Iron deficiency anemia, unspecified: Secondary | ICD-10-CM | POA: Diagnosis not present

## 2016-11-04 DIAGNOSIS — E559 Vitamin D deficiency, unspecified: Secondary | ICD-10-CM | POA: Diagnosis not present

## 2016-11-04 DIAGNOSIS — K219 Gastro-esophageal reflux disease without esophagitis: Secondary | ICD-10-CM | POA: Diagnosis not present

## 2016-11-04 DIAGNOSIS — I10 Essential (primary) hypertension: Secondary | ICD-10-CM | POA: Diagnosis not present

## 2016-11-04 DIAGNOSIS — F17210 Nicotine dependence, cigarettes, uncomplicated: Secondary | ICD-10-CM | POA: Diagnosis not present

## 2016-11-04 DIAGNOSIS — M7989 Other specified soft tissue disorders: Secondary | ICD-10-CM | POA: Diagnosis not present

## 2016-11-04 DIAGNOSIS — Z23 Encounter for immunization: Secondary | ICD-10-CM | POA: Diagnosis not present

## 2016-11-04 MED ORDER — FERROUS SULFATE 325 (65 FE) MG PO TABS: 325.0000 mg | ORAL_TABLET | Freq: Every day | ORAL | 3 refills | Status: DC

## 2016-11-04 MED ORDER — FLUTICASONE PROPIONATE 50 MCG/ACT NA SUSP: 2.0000 | Freq: Every day | NASAL | 2 refills | Status: DC

## 2016-11-04 MED ORDER — OMEPRAZOLE 40 MG PO CPDR: 40.0000 mg | DELAYED_RELEASE_CAPSULE | Freq: Every day | ORAL | 11 refills | Status: DC

## 2016-11-04 MED ORDER — HYDROCHLOROTHIAZIDE 12.5 MG PO TABS: 12.5000 mg | ORAL_TABLET | Freq: Every day | ORAL | 1 refills | Status: DC

## 2016-11-04 MED ORDER — NICOTINE 21 MG/24HR TD PT24: 21.0000 mg | MEDICATED_PATCH | TRANSDERMAL | 0 refills | Status: DC

## 2016-11-04 NOTE — Assessment & Plan Note (Addendum)
Patient was diagnosed with Vit D deficiency in the setting of chronic leg pain earlier this year. She completed 8 weeks of 50,000 units of vit D but then ran out of the prescription and did not follow up. Plan was to decrease to 800 units daily after the initial 8 weeks. Given that she has gone so long without supplements, we will recheck level today and re-dose pending level.  -- F/u Vit D level   ADDENDUM: Vit D low but improved from prior lab, 6.2 -> 16.7. Called patient with results, left message with daughter. [facility] on [address]. 1,000 unit Vit D daily x 3 months, then repeat level.

## 2016-11-04 NOTE — Assessment & Plan Note (Signed)
BP uncontrolled today 165/99 but patient ran out of her BP medications.  -- Refilled HCTZ 12.5 mg daily

## 2016-11-04 NOTE — Assessment & Plan Note (Signed)
Patient has chronic left lower leg pain associated with numbness and tingling that has been extensively worked up and difficult to control. She is currently taking cymbalta and lyrica without relief. NSAIDs, tylenol, muscle relaxers, and heating pads have all been tried and ineffective as well. Work up thus far is significantly only for vit D and iron deficiency which she has only been partially compliant with supplements for. A1c, TSH, B12, HIV, RPR, and nerve conduction studies were all normal. She is requesting something for pain management. She reports that it interferes with her day to day activities and limits her ability to leave her home. We discussed referral to pain management and patient is agreeable.  -- Will follow up Vit D level & replete pending level  -- Refilled iron supplements, goal ferritin > 70 -- Continue lyrica and duloxetine  -- Pain clinic referral

## 2016-11-04 NOTE — Assessment & Plan Note (Addendum)
Last ferritin checked in May of 2018 was low at 16. Goal of > 70 in the setting of chronic leg pain. Patient reports she ran out of her iron supplements and has not been taking them.  -- Refilled Ferrous sulfate tablets  -- F/u Repeat Ferritin level   ADDENDUM: Ferritin 13. Continue supplements, repeat at follow up.

## 2016-11-04 NOTE — Progress Notes (Signed)
   CC: Chronic leg pain follow up  HPI:  [name] is a 46 y.o. female with past medical history outlined below here for follow up of chronic leg pain. For the details of today's visit, please refer to the assessment and plan.  Past Medical History:  Diagnosis Date  . Acid reflux   . Anemia    Iron Def  . Anorexia   . Chronic kidney disease    Nephrotic syndrome  . Colon polyp 2009  . Depression with anxiety   . Edema leg   . Fibromyalgia   . Hemorrhoids   . Hidradenitis suppurativa   . Hypertension   . IBS (irritable bowel syndrome)   . Low back pain   . Migraines   . Morbidly obese ([place])   . Neuromuscular disorder (HCC)    fibromyalgia  . Neuropathy   . Panic attacks   . Polyp of vocal cord or larynx   . Tonsil pain     Review of Systems  Musculoskeletal: Negative for falls.  Neurological: Negative for weakness.    Physical Exam:  Vitals:   11/04/16 1311  BP: (!) 165/99  Pulse: 73  Temp: 98.3 F (36.8 C)  TempSrc: Oral  SpO2: 97%  Weight: (!) 337 lb 6.4 oz (153 kg)    Constitutional: Obese, NAD, appears comfortable Cardiovascular: RRR, no murmurs, rubs, or gallops.  Pulmonary/Chest: CTAB, no wheezes, rales, or rhonchi.   Abdominal: Soft, non tender, non distended. +BS.  Extremities: Warm and well perfused. No edema. Diffuse tenderness to light palpation of left lower extremity  Psychiatric: Normal mood and affect  Assessment & Plan:   See Encounters Tab for problem based charting.  Patient discussed with Dr. Angelia Mould

## 2016-11-04 NOTE — Progress Notes (Signed)
Internal Medicine Clinic Attending  Case discussed with Dr. Guilloud at the time of the visit.  We reviewed the resident's history and exam and pertinent patient test results.  I agree with the assessment, diagnosis, and plan of care documented in the resident's note.  

## 2016-11-04 NOTE — Assessment & Plan Note (Signed)
Endorses sinus congestion, rhinorrhea, sinus pain, and ear pain. Advised flonase 2 sprays each nostril daily.  -- Sent flonase refill to pharmacy

## 2016-11-04 NOTE — Patient Instructions (Signed)
Lindsay [name],  It was a pleasure to see you today. For your sinus headaches, please use flonase 2 sprays each nostril daily. I have refilled your blood pressure medicine, iron tablets, and nicotine patches. Please continue to take all of your medicaitons as previously prescribed. I am rechecking your vitamin D levels today and based on the level will call you in a prescription to your pharmacy. For your leg pain, I have referred you to a pain specialist. You will be contacted for an appointment. If you have any questions or concerns, call our clinic at [phone] or after hours call [phone] and ask for the internal medicine resident on call. Thank you!  - Dr. Philipp Ovens

## 2016-11-04 NOTE — Assessment & Plan Note (Signed)
Chronic smoker, currently smoking ~1/2 PPD but trying to quit. Felt that nicotine patches were only partially effective at reducing her cravings, requesting higher dose. Would like to attempt cessation again.  -- Refilled nicotine patches

## 2016-11-05 LAB — FERRITIN: FERRITIN: 13 ng/mL — AB (ref 15–150)

## 2016-11-05 LAB — VITAMIN D 25 HYDROXY (VIT D DEFICIENCY, FRACTURES): Vit D, 25-Hydroxy: 16.7 ng/mL — ABNORMAL LOW (ref 30.0–100.0)

## 2016-11-06 ENCOUNTER — Telehealth: Payer: Self-pay | Admitting: Internal Medicine

## 2016-11-06 MED ORDER — VITAMIN D 1000 UNITS PO TABS: 1000.0000 [IU] | ORAL_TABLET | Freq: Every day | ORAL | 2 refills | Status: DC

## 2016-11-06 NOTE — Addendum Note (Signed)
Addended by: Jodean Lima on: 11/06/2016 08:46 AM   Modules accepted: Orders

## 2016-11-06 NOTE — Telephone Encounter (Signed)
CONSTANT COUGH, WANTS A REFILL FOR PEARLS, [facility] [place]

## 2016-11-07 MED ORDER — BENZONATATE 100 MG PO CAPS: 100.0000 mg | ORAL_CAPSULE | Freq: Three times a day (TID) | ORAL | 0 refills | Status: DC | PRN

## 2016-11-07 NOTE — Telephone Encounter (Signed)
[facility] [address].

## 2016-11-13 ENCOUNTER — Ambulatory Visit (HOSPITAL_COMMUNITY): Payer: Self-pay | Admitting: Psychiatry

## 2016-11-18 ENCOUNTER — Ambulatory Visit (HOSPITAL_COMMUNITY): Payer: Self-pay | Admitting: Psychiatry

## 2016-11-28 ENCOUNTER — Other Ambulatory Visit: Payer: Self-pay | Admitting: Neurology

## 2016-11-28 ENCOUNTER — Other Ambulatory Visit (HOSPITAL_COMMUNITY): Payer: Self-pay | Admitting: Psychiatry

## 2016-11-28 DIAGNOSIS — F331 Major depressive disorder, recurrent, moderate: Secondary | ICD-10-CM

## 2016-12-02 ENCOUNTER — Other Ambulatory Visit (HOSPITAL_COMMUNITY): Payer: Self-pay

## 2016-12-02 DIAGNOSIS — F331 Major depressive disorder, recurrent, moderate: Secondary | ICD-10-CM

## 2016-12-02 MED ORDER — BREXPIPRAZOLE 1 MG PO TABS: 1.0000 | ORAL_TABLET | Freq: Every day | ORAL | 0 refills | Status: DC

## 2016-12-04 ENCOUNTER — Ambulatory Visit (INDEPENDENT_AMBULATORY_CARE_PROVIDER_SITE_OTHER): Payer: Medicare Other | Admitting: Psychiatry

## 2016-12-04 ENCOUNTER — Encounter (HOSPITAL_COMMUNITY): Payer: Self-pay | Admitting: Psychiatry

## 2016-12-04 DIAGNOSIS — Z818 Family history of other mental and behavioral disorders: Secondary | ICD-10-CM | POA: Diagnosis not present

## 2016-12-04 DIAGNOSIS — F17210 Nicotine dependence, cigarettes, uncomplicated: Secondary | ICD-10-CM | POA: Diagnosis not present

## 2016-12-04 DIAGNOSIS — F419 Anxiety disorder, unspecified: Secondary | ICD-10-CM

## 2016-12-04 DIAGNOSIS — F331 Major depressive disorder, recurrent, moderate: Secondary | ICD-10-CM | POA: Diagnosis not present

## 2016-12-04 DIAGNOSIS — M549 Dorsalgia, unspecified: Secondary | ICD-10-CM

## 2016-12-04 DIAGNOSIS — M2550 Pain in unspecified joint: Secondary | ICD-10-CM | POA: Diagnosis not present

## 2016-12-04 MED ORDER — CLORAZEPATE DIPOTASSIUM 15 MG PO TABS: ORAL_TABLET | ORAL | 2 refills | Status: DC

## 2016-12-04 MED ORDER — BREXPIPRAZOLE 1 MG PO TABS: 1.0000 | ORAL_TABLET | Freq: Every day | ORAL | 2 refills | Status: DC

## 2016-12-04 MED ORDER — HYDROXYZINE PAMOATE 50 MG PO CAPS: ORAL_CAPSULE | ORAL | 2 refills | Status: DC
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
## 2016-12-23 ENCOUNTER — Other Ambulatory Visit: Payer: Self-pay | Admitting: Internal Medicine

## 2016-12-23 DIAGNOSIS — M79604 Pain in right leg: Secondary | ICD-10-CM

## 2016-12-23 DIAGNOSIS — M79605 Pain in left leg: Principal | ICD-10-CM

## 2017-01-26 ENCOUNTER — Other Ambulatory Visit: Payer: Self-pay | Admitting: Internal Medicine

## 2017-01-26 ENCOUNTER — Other Ambulatory Visit: Payer: Self-pay | Admitting: Neurology

## 2017-01-26 DIAGNOSIS — M79605 Pain in left leg: Principal | ICD-10-CM

## 2017-01-26 DIAGNOSIS — M79604 Pain in right leg: Secondary | ICD-10-CM

## 2017-02-25 ENCOUNTER — Other Ambulatory Visit: Payer: Self-pay | Admitting: Neurology

## 2017-03-03 ENCOUNTER — Other Ambulatory Visit (INDEPENDENT_AMBULATORY_CARE_PROVIDER_SITE_OTHER): Payer: Self-pay

## 2017-03-03 ENCOUNTER — Other Ambulatory Visit: Payer: Self-pay | Admitting: Internal Medicine

## 2017-03-03 ENCOUNTER — Other Ambulatory Visit: Payer: Self-pay | Admitting: Neurology

## 2017-03-03 DIAGNOSIS — M79604 Pain in right leg: Secondary | ICD-10-CM

## 2017-03-03 DIAGNOSIS — M79605 Pain in left leg: Principal | ICD-10-CM

## 2017-03-03 MED ORDER — DULOXETINE HCL 60 MG PO CPEP: 60.0000 mg | ORAL_CAPSULE | Freq: Two times a day (BID) | ORAL | 0 refills | Status: DC

## 2017-03-04 NOTE — Telephone Encounter (Signed)
Refill for tessalon perles and phenergan declined. Patient needs an appointment for these. Thanks.

## 2017-03-06 ENCOUNTER — Ambulatory Visit: Payer: Self-pay

## 2017-03-06 ENCOUNTER — Ambulatory Visit (HOSPITAL_COMMUNITY): Payer: Self-pay | Admitting: Psychiatry

## 2017-03-06 ENCOUNTER — Other Ambulatory Visit: Payer: Self-pay | Admitting: Internal Medicine

## 2017-03-06 DIAGNOSIS — J309 Allergic rhinitis, unspecified: Secondary | ICD-10-CM

## 2017-03-06 DIAGNOSIS — M79604 Pain in right leg: Secondary | ICD-10-CM

## 2017-03-06 DIAGNOSIS — E611 Iron deficiency: Secondary | ICD-10-CM

## 2017-03-06 DIAGNOSIS — M79605 Pain in left leg: Secondary | ICD-10-CM

## 2017-03-06 DIAGNOSIS — E559 Vitamin D deficiency, unspecified: Secondary | ICD-10-CM

## 2017-03-06 DIAGNOSIS — M7989 Other specified soft tissue disorders: Secondary | ICD-10-CM

## 2017-03-06 DIAGNOSIS — I10 Essential (primary) hypertension: Secondary | ICD-10-CM

## 2017-03-06 NOTE — Telephone Encounter (Signed)
Patient unable to make appt, she is wanting a refills on meds. Pls call patient

## 2017-03-07 MED ORDER — PREGABALIN 100 MG PO CAPS: 100.0000 mg | ORAL_CAPSULE | Freq: Three times a day (TID) | ORAL | 0 refills | Status: DC

## 2017-03-07 MED ORDER — VITAMIN D 1000 UNITS PO TABS: 1000.0000 [IU] | ORAL_TABLET | Freq: Every day | ORAL | 0 refills | Status: DC

## 2017-03-07 MED ORDER — HYDROCHLOROTHIAZIDE 12.5 MG PO TABS: 12.5000 mg | ORAL_TABLET | Freq: Every day | ORAL | 0 refills | Status: DC

## 2017-03-07 MED ORDER — FERROUS SULFATE 325 (65 FE) MG PO TABS: 325.0000 mg | ORAL_TABLET | Freq: Every day | ORAL | 0 refills | Status: DC

## 2017-03-07 MED ORDER — FLUTICASONE PROPIONATE 50 MCG/ACT NA SUSP: 2.0000 | Freq: Every day | NASAL | 2 refills | Status: DC

## 2017-03-07 NOTE — Telephone Encounter (Signed)
Approved 1 month [facility] [address]. Please have her reschedule follow up. Thanks.

## 2017-03-25 ENCOUNTER — Encounter (HOSPITAL_COMMUNITY): Payer: Self-pay | Admitting: *Deleted

## 2017-03-25 ENCOUNTER — Telehealth: Payer: Self-pay | Admitting: Internal Medicine

## 2017-03-25 ENCOUNTER — Emergency Department (HOSPITAL_COMMUNITY)
Admit: 2017-03-25 | Discharge: 2017-03-25 | Disposition: A | Discharge status: Home / Self Care | Payer: Medicare Other | Attending: Student in an Organized Health Care Education/Training Program | Admitting: Student in an Organized Health Care Education/Training Program

## 2017-03-25 ENCOUNTER — Emergency Department (HOSPITAL_COMMUNITY)
Admission: EM | Admit: 2017-03-25 | Discharge: 2017-03-25 | Disposition: A | Discharge status: Home / Self Care | Payer: Medicare Other | Attending: Emergency Medicine | Admitting: Emergency Medicine

## 2017-03-25 ENCOUNTER — Ambulatory Visit (INDEPENDENT_AMBULATORY_CARE_PROVIDER_SITE_OTHER): Payer: Medicare Other | Admitting: Internal Medicine

## 2017-03-25 ENCOUNTER — Encounter: Payer: Self-pay | Admitting: Internal Medicine

## 2017-03-25 ENCOUNTER — Other Ambulatory Visit: Payer: Self-pay | Admitting: Internal Medicine

## 2017-03-25 ENCOUNTER — Other Ambulatory Visit: Payer: Self-pay

## 2017-03-25 VITALS — BP 162/95 | HR 87 | Temp 98.7°F | Ht 71.0 in | Wt 322.9 lb

## 2017-03-25 DIAGNOSIS — R110 Nausea: Secondary | ICD-10-CM

## 2017-03-25 DIAGNOSIS — R059 Cough, unspecified: Secondary | ICD-10-CM

## 2017-03-25 DIAGNOSIS — R5383 Other fatigue: Secondary | ICD-10-CM

## 2017-03-25 DIAGNOSIS — L732 Hidradenitis suppurativa: Secondary | ICD-10-CM

## 2017-03-25 DIAGNOSIS — R52 Pain, unspecified: Secondary | ICD-10-CM | POA: Diagnosis not present

## 2017-03-25 DIAGNOSIS — J3489 Other specified disorders of nose and nasal sinuses: Secondary | ICD-10-CM

## 2017-03-25 DIAGNOSIS — R05 Cough: Secondary | ICD-10-CM

## 2017-03-25 DIAGNOSIS — M79604 Pain in right leg: Secondary | ICD-10-CM

## 2017-03-25 DIAGNOSIS — R0602 Shortness of breath: Secondary | ICD-10-CM

## 2017-03-25 DIAGNOSIS — R600 Localized edema: Secondary | ICD-10-CM | POA: Insufficient documentation

## 2017-03-25 DIAGNOSIS — M791 Myalgia, unspecified site: Secondary | ICD-10-CM

## 2017-03-25 DIAGNOSIS — R0981 Nasal congestion: Secondary | ICD-10-CM | POA: Diagnosis not present

## 2017-03-25 DIAGNOSIS — Z5321 Procedure and treatment not carried out due to patient leaving prior to being seen by health care provider: Secondary | ICD-10-CM | POA: Insufficient documentation

## 2017-03-25 DIAGNOSIS — F17200 Nicotine dependence, unspecified, uncomplicated: Secondary | ICD-10-CM | POA: Diagnosis not present

## 2017-03-25 DIAGNOSIS — R093 Abnormal sputum: Secondary | ICD-10-CM | POA: Diagnosis not present

## 2017-03-25 DIAGNOSIS — R5381 Other malaise: Secondary | ICD-10-CM | POA: Diagnosis not present

## 2017-03-25 DIAGNOSIS — R509 Fever, unspecified: Secondary | ICD-10-CM | POA: Diagnosis not present

## 2017-03-25 DIAGNOSIS — M79605 Pain in left leg: Principal | ICD-10-CM

## 2017-03-25 IMAGING — DX DG CHEST 2V
2 series · 2 of 2 positions shown · non-contrast
Comparison: Chest x-ray of [DATE]

CLINICAL DATA: Cough for 2 weeks, intermittent fever, smoking
history

EXAM:
CHEST  2 VIEW

[chest pa]
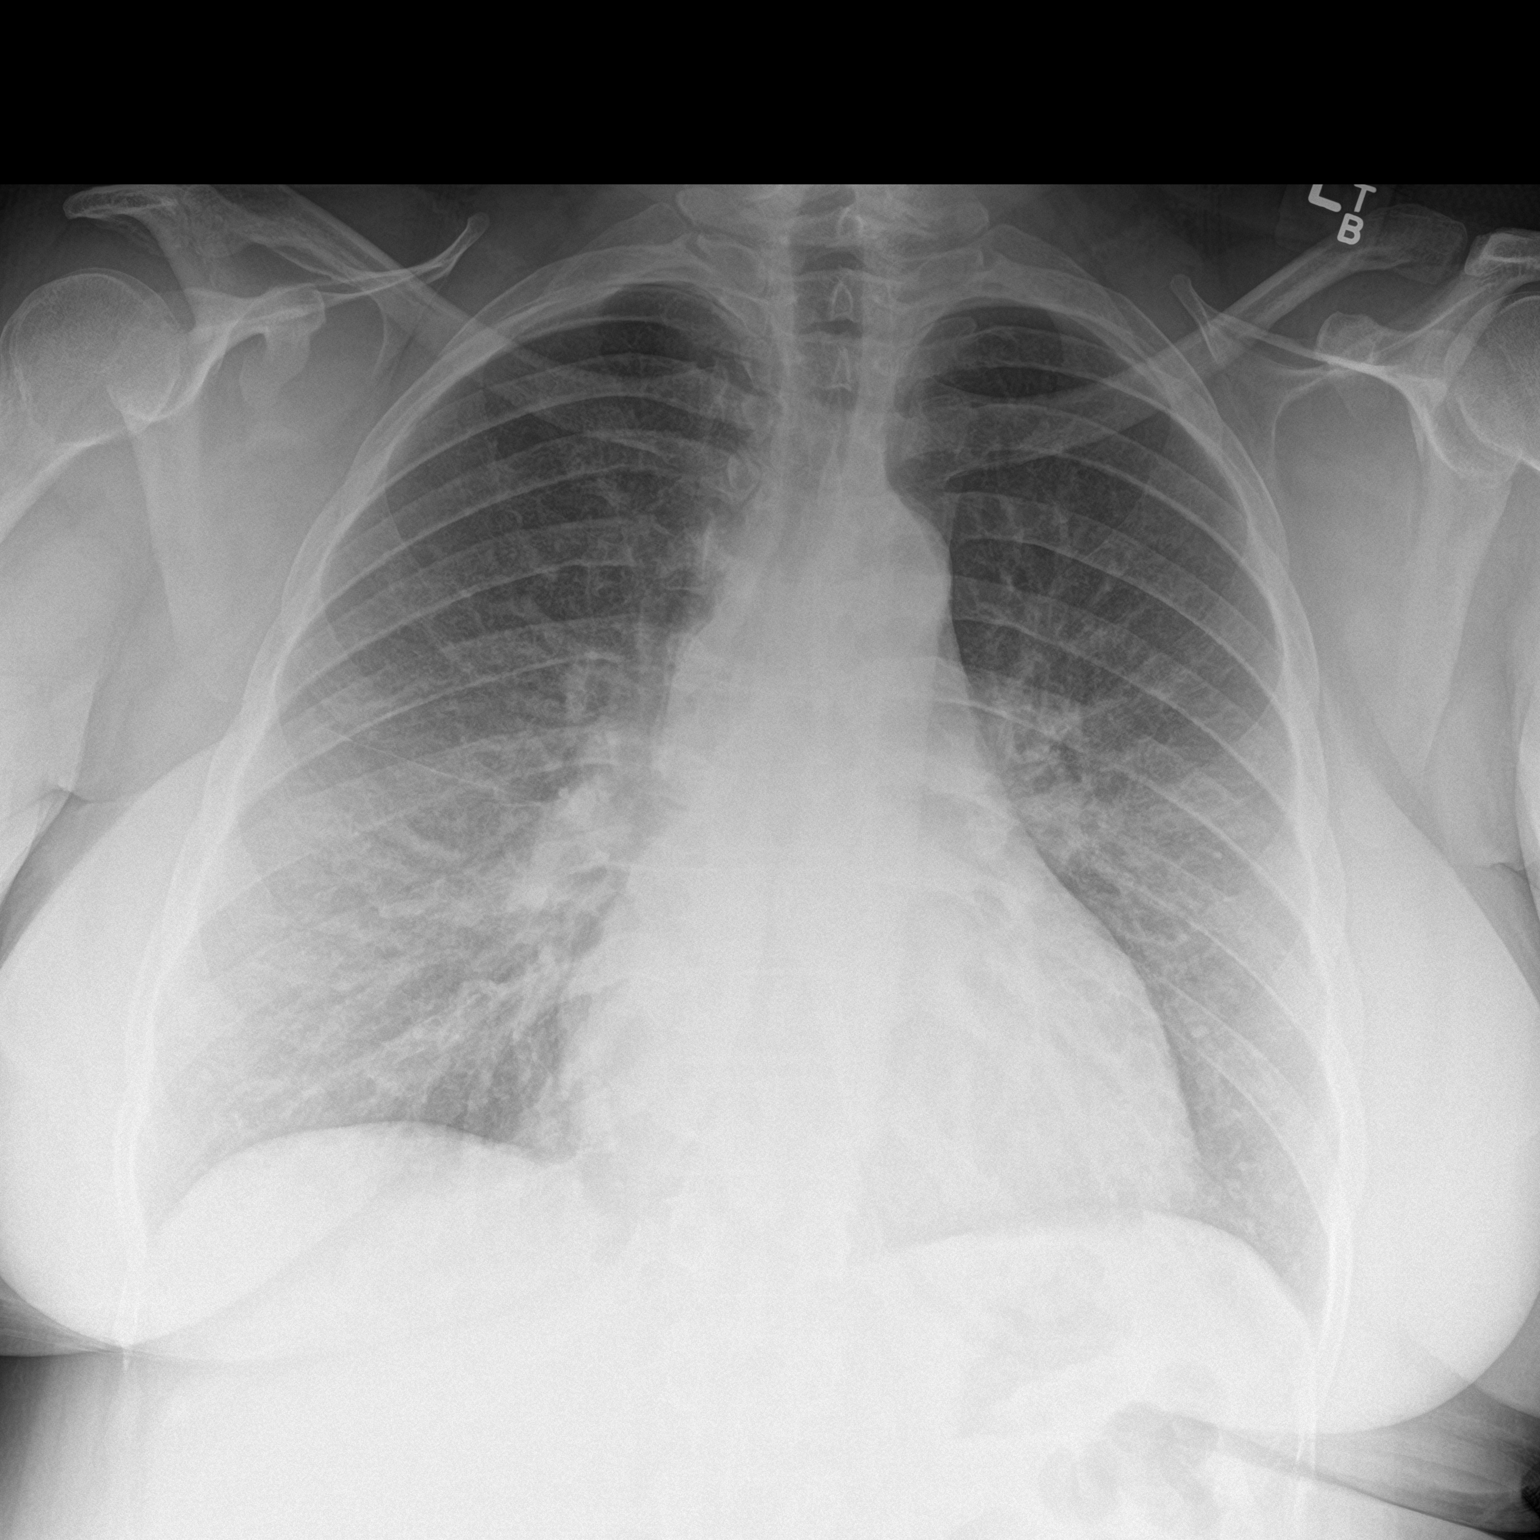

[chest lat]
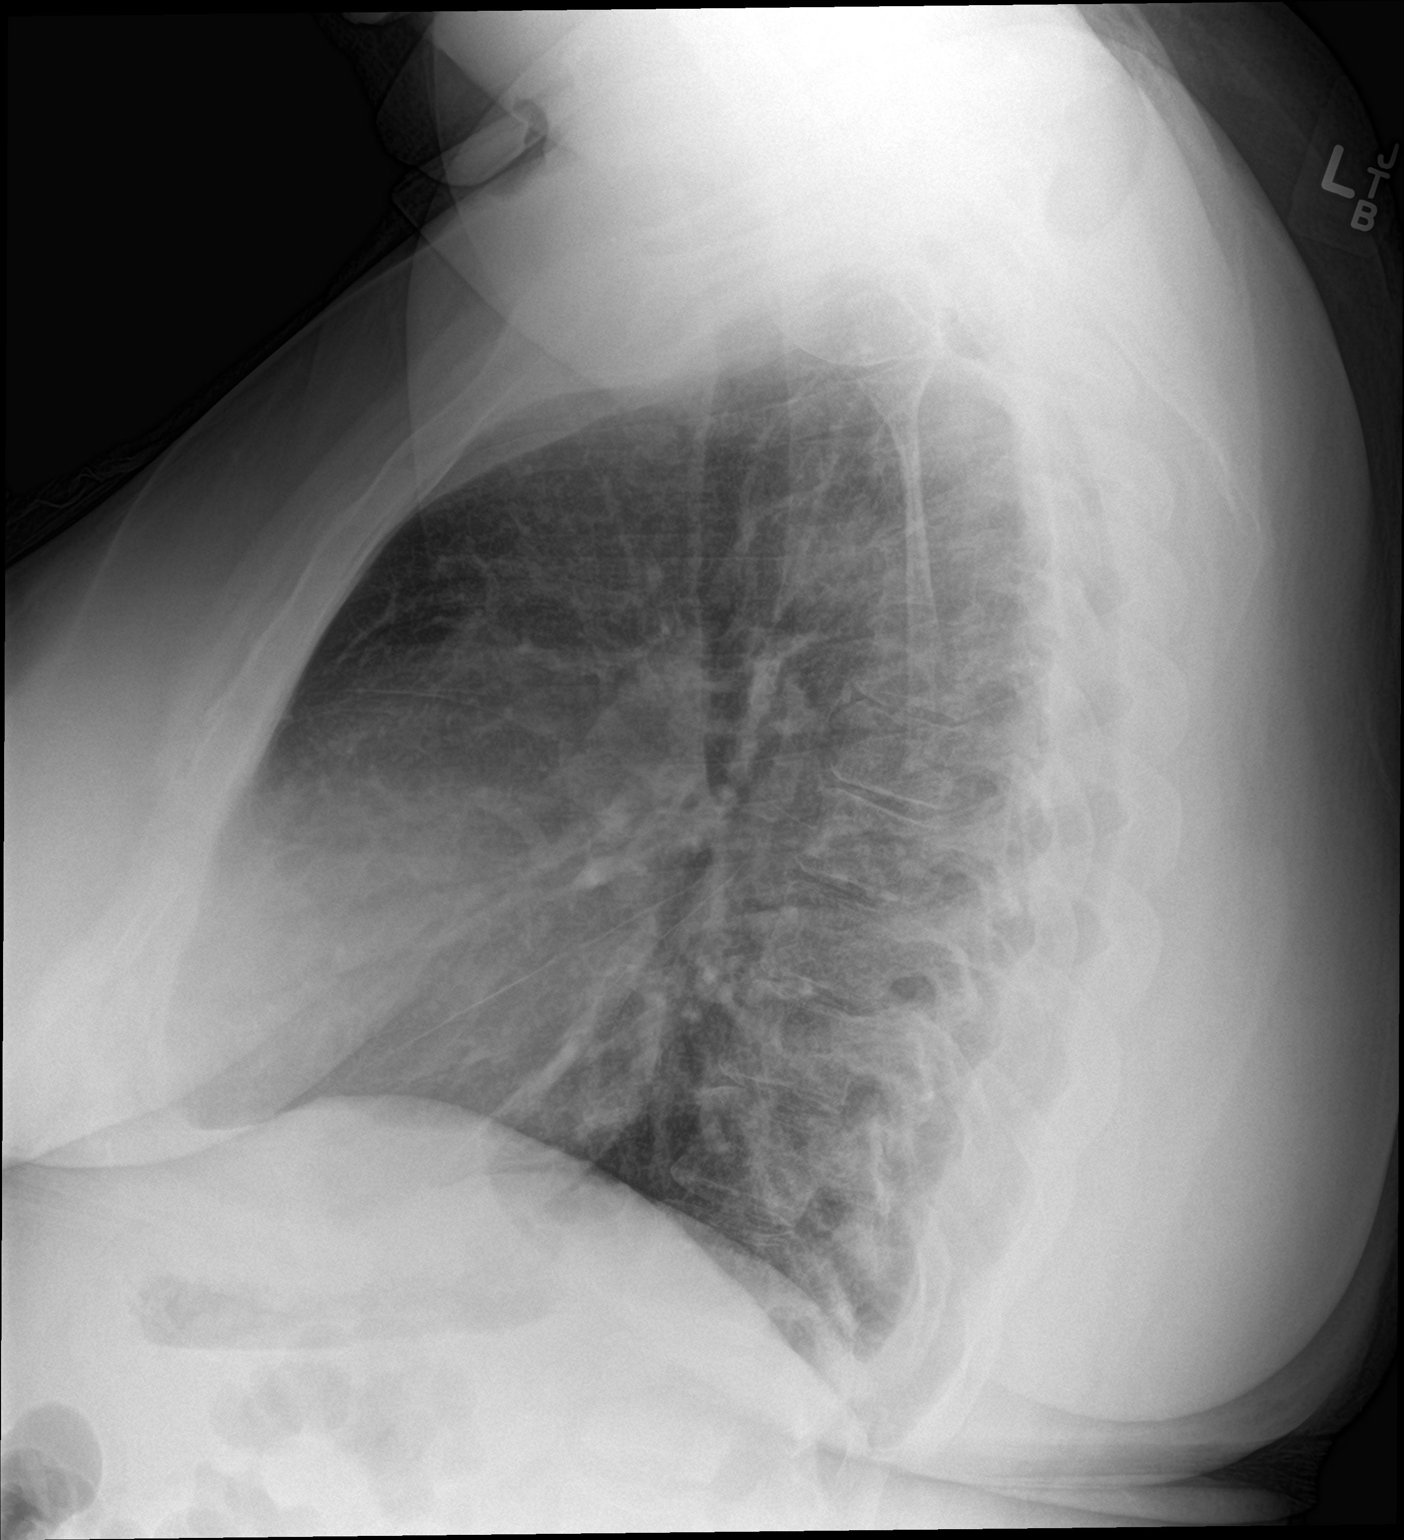

[2 of 2 positions shown; findings below may reference images not displayed]

FINDINGS: There is slight prominence of interstitial markings throughout the
lungs which may be related to the patient's smoking history. No
focal infiltrate is seen and no mass is noted. Mediastinal and hilar
contours are unremarkable. The heart is mildly enlarged. No bony
abnormality is seen.
IMPRESSION: Slightly more prominent interstitial markings may be related to the
patient's smoking history. No pneumonia is noted and there is no
evidence of pleural effusion.

## 2017-03-25 MED ORDER — CLINDAMYCIN PHOSPHATE 1 % EX GEL: Freq: Two times a day (BID) | CUTANEOUS | 1 refills | Status: DC

## 2017-03-25 NOTE — Telephone Encounter (Signed)
PT WANTS TEST RESULTS FROM X-RAY TODAY

## 2017-03-25 NOTE — Patient Instructions (Signed)
Lindsay [name],   Please go to radiology to get a chest x ray. I will call you later today with the results and let you know if you will or will not need medication.   Please call us or make an appointment if you symptoms have not improved in the next 1-2 weeks.   I will send a refill for topical clindamycin to your pharmacy.

## 2017-03-25 NOTE — Progress Notes (Signed)
   CC: Cough and hidradenitis suppurativa   HPI:  [name] is a 47 y.o. female with PMH listed below who presents to clinic for cough and hidradenitis suppurativa. Please see problem based assessment and plan for further details.   Past Medical History:  Diagnosis Date  . Acid reflux   . Anemia    Iron Def  . Anorexia   . Chronic kidney disease    Nephrotic syndrome  . Colon polyp 2009  . Depression with anxiety   . Edema leg   . Fibromyalgia   . Hemorrhoids   . Hidradenitis suppurativa   . Hypertension   . IBS (irritable bowel syndrome)   . Low back pain   . Migraines   . Morbidly obese ([place])   . Neuromuscular disorder (HCC)    fibromyalgia  . Neuropathy   . Panic attacks   . Polyp of vocal cord or larynx   . Tonsil pain    Review of Systems:   Review of Systems  Constitutional: Positive for chills, fever and malaise/fatigue.  HENT: Positive for congestion and sinus pain.   Respiratory: Positive for cough, sputum production and shortness of breath. Negative for hemoptysis and wheezing.   Gastrointestinal: Positive for nausea. Negative for abdominal pain and vomiting.  Musculoskeletal: Positive for myalgias.    Physical Exam:  Vitals:   03/25/17 0857 03/25/17 1022  BP: (!) 165/100 (!) 162/95  Pulse: 87 87  Temp: 98.7 F (37.1 C)   TempSrc: Oral   SpO2: 96% 97%  Weight: (!) 322 lb 14.4 oz (146.5 kg)   Height: 5\' 11"  (1.803 m)    Physical Exam  Constitutional:  Patient appears tired but is able to speak in full sentences   HENT:  Mouth/Throat: Oropharynx is clear and moist. No oropharyngeal exudate.  Neck: Normal range of motion. Neck supple.  Cardiovascular: Normal rate, regular rhythm and normal heart sounds. Exam reveals no gallop and no friction rub.  No murmur heard. Pulmonary/Chest: Effort normal and breath sounds normal. No respiratory distress. She has no wheezes. She has no rales.  Lymphadenopathy:    She has no cervical adenopathy.     Assessment & Plan:   See Encounters Tab for problem based charting.  Patient seen with Dr. Evette Doffing

## 2017-03-25 NOTE — ED Notes (Signed)
Called pt for room multiple times. No answer

## 2017-03-25 NOTE — ED Notes (Signed)
Called x2 for room

## 2017-03-25 NOTE — ED Triage Notes (Addendum)
Pt reports cough, congestion, fevers and bodyaches for approx 1 week. Pt was prescribed tessalon pearls and phenergan previously for this however she has ran out. Pt reports a "cyst"under her left arm as well.

## 2017-03-26 ENCOUNTER — Encounter: Payer: Self-pay | Admitting: Internal Medicine

## 2017-03-26 ENCOUNTER — Other Ambulatory Visit: Payer: Self-pay | Admitting: Internal Medicine

## 2017-03-26 DIAGNOSIS — M79604 Pain in right leg: Secondary | ICD-10-CM

## 2017-03-26 DIAGNOSIS — M79605 Pain in left leg: Principal | ICD-10-CM

## 2017-03-26 MED ORDER — PROMETHAZINE HCL 25 MG PO TABS: 25.0000 mg | ORAL_TABLET | Freq: Two times a day (BID) | ORAL | 0 refills | Status: DC | PRN

## 2017-03-26 MED ORDER — BENZONATATE 100 MG PO CAPS: 100.0000 mg | ORAL_CAPSULE | Freq: Three times a day (TID) | ORAL | 0 refills | Status: DC | PRN

## 2017-03-26 NOTE — Assessment & Plan Note (Addendum)
Patient presents with a 2-week history of cough productive of yellow/green sputum that is associated with fever, chills, nausea, and myalgias. She also endorses shortness of breath and poor appetite during this time. Denies sick contacts. Received flu shot on 10/2016. Denies history of asthma or COPD, but is a current smoker. States she has been unable to smoke for the last 3-4 days due worsening of her cough. She has been taking Tessalon pearls which help with the cough as well as Dayquil and Nyquil. On exam, she is satting 96% on RA.  She appears tired, but is able to speak in full sentences. Cardiac and lung exam are benign and no muscle retraction observed. Suspect URI vs acute bronchitis vs pneumonia. Will obtain CXR today and start antibiotic therapy if consolidation is seen.   - CXR  - Advise patient to purchase products with dextromethorphan HBR if she desires to purchase OTC medications for symptom relief  - Advise patient to return to clinic if symptoms worsen or if no improvement within the next 2 weeks   CXR with prominent interstitial markings and no consolidations or pleural effusions. Suspect LRI from viral etiology and will continue supportive care at this time. Called patient to discuss results and unable to reach her. Will attempt again today (2/27).

## 2017-03-26 NOTE — Progress Notes (Signed)
Internal Medicine Clinic Attending  I saw and evaluated the patient.  I personally confirmed the key portions of the history and exam documented by Dr. Santos-Sanchez and I reviewed pertinent patient test results.  The assessment, diagnosis, and plan were formulated together and I agree with the documentation in the resident's note. 

## 2017-03-26 NOTE — Assessment & Plan Note (Signed)
Patient presents with painful lesion on L axilla. States she noticed it ~ 2 weeks ago and has been using topical clindamycin with good results, but ran out of it 2 days ago. Has not noticed drainage. On exam there is a pink nodule on L axilla that is tender to the touch. No fluctuance noted, sinus tract, or drainage noted.   - Refilled topical clindamycin gel

## 2017-03-30 ENCOUNTER — Other Ambulatory Visit (HOSPITAL_COMMUNITY): Payer: Self-pay | Admitting: Psychiatry

## 2017-03-30 DIAGNOSIS — F331 Major depressive disorder, recurrent, moderate: Secondary | ICD-10-CM

## 2017-03-31 ENCOUNTER — Other Ambulatory Visit: Payer: Self-pay | Admitting: Neurology

## 2017-04-01 ENCOUNTER — Other Ambulatory Visit: Payer: Self-pay | Admitting: Neurology

## 2017-04-02 ENCOUNTER — Other Ambulatory Visit (HOSPITAL_COMMUNITY): Payer: Self-pay

## 2017-04-02 ENCOUNTER — Other Ambulatory Visit (HOSPITAL_COMMUNITY): Payer: Self-pay | Admitting: Psychiatry

## 2017-04-02 ENCOUNTER — Encounter: Payer: Self-pay | Admitting: *Deleted

## 2017-04-02 ENCOUNTER — Other Ambulatory Visit: Payer: Self-pay | Admitting: Neurology

## 2017-04-02 DIAGNOSIS — F331 Major depressive disorder, recurrent, moderate: Secondary | ICD-10-CM

## 2017-04-02 MED ORDER — BREXPIPRAZOLE 1 MG PO TABS: 1.0000 | ORAL_TABLET | Freq: Every day | ORAL | 0 refills | Status: DC

## 2017-04-02 MED ORDER — CLORAZEPATE DIPOTASSIUM 15 MG PO TABS: ORAL_TABLET | ORAL | 0 refills | Status: DC

## 2017-04-02 MED ORDER — HYDROXYZINE PAMOATE 50 MG PO CAPS: ORAL_CAPSULE | ORAL | 0 refills | Status: DC

## 2017-04-03 NOTE — Telephone Encounter (Signed)
Dr Frederico Hamman addressed this 2/27 via phone

## 2017-04-05 DIAGNOSIS — M25579 Pain in unspecified ankle and joints of unspecified foot: Secondary | ICD-10-CM | POA: Diagnosis not present

## 2017-04-05 DIAGNOSIS — M25549 Pain in joints of unspecified hand: Secondary | ICD-10-CM | POA: Diagnosis not present

## 2017-04-05 DIAGNOSIS — G894 Chronic pain syndrome: Secondary | ICD-10-CM | POA: Diagnosis not present

## 2017-04-05 DIAGNOSIS — M545 Low back pain: Secondary | ICD-10-CM | POA: Diagnosis not present

## 2017-04-05 DIAGNOSIS — M25569 Pain in unspecified knee: Secondary | ICD-10-CM | POA: Diagnosis not present

## 2017-04-07 ENCOUNTER — Other Ambulatory Visit: Payer: Self-pay | Admitting: Internal Medicine

## 2017-04-07 DIAGNOSIS — E559 Vitamin D deficiency, unspecified: Secondary | ICD-10-CM

## 2017-04-07 NOTE — Telephone Encounter (Signed)
Has PCP appt next week. May get repeat Vit D F/U level then

## 2017-04-08 ENCOUNTER — Encounter: Payer: Self-pay | Admitting: Internal Medicine

## 2017-04-10 ENCOUNTER — Telehealth: Payer: Self-pay | Admitting: *Deleted

## 2017-04-10 NOTE — Telephone Encounter (Signed)
Called pt - no answer; left message to call the office to schedule an appt tomorrow.

## 2017-04-10 NOTE — Telephone Encounter (Signed)
Called pt again - no answer.  Forward message to front office - please schedule pt a Friday appt per Dr Eppie Gibson . Thanks

## 2017-04-10 NOTE — Telephone Encounter (Signed)
Pt answered ph will be in [facility] 3/15 at 1300 if she can get transportation, if she cant triage will try to arrange transportation

## 2017-04-10 NOTE — Telephone Encounter (Signed)
-----   Message from Oval Linsey, MD sent at 04/10/2017  3:44 PM EDT ----- Regarding: RE: Please get records from [facility] [place] Dear Lindsay [name] or Bonnita Nasuti,  Please schedule [name] in [facility] tomorrow for cardiac exam with stethoscope, ECG, and lower extremity pulse check.  I e-mailed her that we would be doing this.  Thanks,   Fritz Pickerel ----- Message ----- From: [name], RN Sent: 04/10/2017   8:53 AM To: Oval Linsey, MD Subject: RE: Please get records from [facility]  Dr Eppie Gibson Notes are in Dr Rivka Safer mailbox. [name] ----- Message ----- From: Oval Linsey, MD Sent: 04/08/2017   2:44 PM To: [name], RN, Velna Ochs, MD Subject: Please get records from [facility] #  Dear Lindsay Raring,  Ms. Pujol e-mailed Dr. Philipp Ovens.  I can not address her concerns until we have the records from Dr. Mirna Mires at [facility] that is referred in her e-mail.  Please obtain these records for our review.  She claims to have visited the clinic on Saturday March 9th.  Thanks,   Terressa Koyanagi

## 2017-04-11 ENCOUNTER — Ambulatory Visit (HOSPITAL_COMMUNITY)
Admission: RE | Admit: 2017-04-11 | Discharge: 2017-04-11 | Disposition: A | Discharge status: Home / Self Care | Payer: Medicare Other | Source: Ambulatory Visit | Attending: Family Medicine | Admitting: Family Medicine

## 2017-04-11 ENCOUNTER — Ambulatory Visit (INDEPENDENT_AMBULATORY_CARE_PROVIDER_SITE_OTHER): Payer: Medicare Other | Admitting: Internal Medicine

## 2017-04-11 ENCOUNTER — Encounter: Payer: Self-pay | Admitting: Internal Medicine

## 2017-04-11 VITALS — BP 161/97 | HR 90 | Temp 98.3°F | Wt 326.1 lb

## 2017-04-11 DIAGNOSIS — R002 Palpitations: Secondary | ICD-10-CM | POA: Insufficient documentation

## 2017-04-11 DIAGNOSIS — Z6841 Body Mass Index (BMI) 40.0 and over, adult: Secondary | ICD-10-CM | POA: Diagnosis not present

## 2017-04-11 DIAGNOSIS — G8929 Other chronic pain: Secondary | ICD-10-CM

## 2017-04-11 DIAGNOSIS — I10 Essential (primary) hypertension: Secondary | ICD-10-CM

## 2017-04-11 DIAGNOSIS — M79605 Pain in left leg: Secondary | ICD-10-CM

## 2017-04-11 DIAGNOSIS — M79604 Pain in right leg: Secondary | ICD-10-CM

## 2017-04-11 DIAGNOSIS — G43909 Migraine, unspecified, not intractable, without status migrainosus: Secondary | ICD-10-CM | POA: Diagnosis not present

## 2017-04-11 DIAGNOSIS — E669 Obesity, unspecified: Secondary | ICD-10-CM | POA: Diagnosis not present

## 2017-04-11 DIAGNOSIS — Z720 Tobacco use: Secondary | ICD-10-CM | POA: Diagnosis not present

## 2017-04-11 MED ORDER — KETOROLAC TROMETHAMINE 30 MG/ML IJ SOLN
15.0000 mg | Freq: Once | INTRAMUSCULAR | Status: AC
  Administered 2017-04-11: 15 mg via INTRAMUSCULAR

## 2017-04-11 MED ORDER — KETOROLAC TROMETHAMINE 15 MG/ML IJ SOLN: 15.0000 mg | Freq: Once | INTRAMUSCULAR | Status: DC

## 2017-04-11 MED ORDER — KETOROLAC TROMETHAMINE 30 MG/ML IJ SOLN: 15.0000 mg | Freq: Once | INTRAMUSCULAR | Status: DC

## 2017-04-11 NOTE — Assessment & Plan Note (Signed)
She has chronic bilateral lower extremity pain. She was recently evaluated by pain management who felt that some of her pain may be related to her vascular system. ABIs were obtained today that were within normal limits. The patient will follow-up with pain management at the end of this month. No changes to her medications were made today.

## 2017-04-11 NOTE — Progress Notes (Signed)
   CC: Palpitations  HPI:  [name] is a 47 y.o. female who presented to clinic after being evaluated by Dr. Mirna Mires with pain management and being told she had an abnormal heart rhythm and absent pulses in her left leg. She is very concerned. She does feel that she has had increasing left LE pain but states it is the same pain she typically has. She has also noticed tachycardia and palpitations with physical exertion. These symptoms occur daily. She denies chest pain, lightheadedness, dizziness, abdominal pain, nausea/vomiting, shortness of breath at rest.  Past Medical History:  Diagnosis Date  . Acid reflux   . Anemia    Iron Def  . Anorexia   . Chronic kidney disease    Nephrotic syndrome  . Colon polyp 2009  . Depression with anxiety   . Edema leg   . Fibromyalgia   . Hemorrhoids   . Hidradenitis suppurativa   . Hypertension   . IBS (irritable bowel syndrome)   . Low back pain   . Migraines   . Morbidly obese ([place])   . Neuromuscular disorder (HCC)    fibromyalgia  . Neuropathy   . Panic attacks   . Polyp of vocal cord or larynx   . Tonsil pain    Review of Systems:   Denies chest pain, lightheadedness, dizziness, abdominal pain, nausea/vomiting, shortness of breath at rest.  Physical Exam: Vitals:   04/11/17 1317  BP: (!) 161/97  Pulse: 90  Temp: 98.3 F (36.8 C)  TempSrc: Oral  SpO2: 96%  Weight: (!) 326 lb 1.6 oz (147.9 kg)   General: Obese female in no acute distress Pulm: Good air movement with no wheezing or crackles  CV: RRR, no murmurs, no rubs  Extremities: LE are cool. Both dorsalis pedis and posterior tibial are palpable in the bilateral LEs. ABIs within normal limits.   Assessment & Plan:   See Encounters Tab for problem based charting.  Patient discussed with Dr. Evette Doffing

## 2017-04-11 NOTE — Assessment & Plan Note (Signed)
Patient presented with 4 to 5 days of intense unilateral throbbing headache. Associated symptoms include nausea/vomiting and inability to sleep. She is requesting a Toradol shot today. Most recent labs reviewed and creatinine is within normal limits.  Plan: - Ketoralac 15 mg IM

## 2017-04-11 NOTE — Assessment & Plan Note (Signed)
Patient presented with exertional palpitations and irregular heartbeat noted at her recent pain management visit. EKG obtained today is normal sinus rhythm with normal access and intervals. No ST segment elevation or T-wave conversion. With ambulation the patient becomes tachycardic but her rhythm is still regular. She does have multiple risk factors for a fib/flutter including her obesity, hypertension, and tobacco use. We will refer her to cardiology for further evaluation and cardiac monitoring.  Plan: - Referral to cardiology for outpatient cardiac monitoring

## 2017-04-11 NOTE — Patient Instructions (Signed)
Thank you for allowing [name] to provide your care. I have sent over a referral to cardiology to get you on a heart monitor. Please let us know if you have not heard anything within the next week.

## 2017-04-14 ENCOUNTER — Encounter: Payer: Self-pay | Admitting: Internal Medicine

## 2017-04-14 ENCOUNTER — Telehealth: Payer: Self-pay | Admitting: Internal Medicine

## 2017-04-14 NOTE — Progress Notes (Signed)
Internal Medicine Clinic Attending  Case discussed with Dr. Helberg at the time of the visit.  We reviewed the resident's history and exam and pertinent patient test results.  I agree with the assessment, diagnosis, and plan of care documented in the resident's note.    

## 2017-04-14 NOTE — Telephone Encounter (Signed)
Called Heart care to discuss patient referral. They have her scheduled form 05/06/17 at 10 am. I discussed this with the patient. She is concerned that she will see a PA. We discussed that I have no doubts that she will get the care she needs.

## 2017-04-18 ENCOUNTER — Other Ambulatory Visit: Payer: Self-pay | Admitting: Internal Medicine

## 2017-04-18 DIAGNOSIS — M79604 Pain in right leg: Secondary | ICD-10-CM

## 2017-04-18 DIAGNOSIS — M79605 Pain in left leg: Principal | ICD-10-CM

## 2017-04-21 NOTE — Telephone Encounter (Signed)
Refill request for phenergan declined. Patient no showed her last appointment with me. Please have her schedule follow up. Thank you!

## 2017-04-21 NOTE — Telephone Encounter (Signed)
Please assist patient in scheduling appt with PCP for refill on phenergan. Hubbard Hartshorn, RN, BSN

## 2017-04-22 ENCOUNTER — Encounter (HOSPITAL_COMMUNITY): Payer: Self-pay | Admitting: Psychiatry

## 2017-04-22 ENCOUNTER — Ambulatory Visit (INDEPENDENT_AMBULATORY_CARE_PROVIDER_SITE_OTHER): Payer: Medicare Other | Admitting: Psychiatry

## 2017-04-22 DIAGNOSIS — F331 Major depressive disorder, recurrent, moderate: Secondary | ICD-10-CM

## 2017-04-22 DIAGNOSIS — M549 Dorsalgia, unspecified: Secondary | ICD-10-CM

## 2017-04-22 DIAGNOSIS — Z818 Family history of other mental and behavioral disorders: Secondary | ICD-10-CM | POA: Diagnosis not present

## 2017-04-22 DIAGNOSIS — M2550 Pain in unspecified joint: Secondary | ICD-10-CM

## 2017-04-22 DIAGNOSIS — F17210 Nicotine dependence, cigarettes, uncomplicated: Secondary | ICD-10-CM

## 2017-04-22 DIAGNOSIS — F419 Anxiety disorder, unspecified: Secondary | ICD-10-CM

## 2017-04-22 MED ORDER — BREXPIPRAZOLE 1 MG PO TABS: 1.0000 | ORAL_TABLET | Freq: Every day | ORAL | 0 refills | Status: DC

## 2017-04-22 MED ORDER — HYDROXYZINE PAMOATE 50 MG PO CAPS: ORAL_CAPSULE | ORAL | 0 refills | Status: DC

## 2017-04-22 MED ORDER — CLORAZEPATE DIPOTASSIUM 15 MG PO TABS: ORAL_TABLET | ORAL | 2 refills | Status: DC
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
## 2017-05-02 ENCOUNTER — Encounter: Payer: Self-pay | Admitting: Internal Medicine

## 2017-05-02 ENCOUNTER — Ambulatory Visit (INDEPENDENT_AMBULATORY_CARE_PROVIDER_SITE_OTHER): Payer: Medicare Other | Admitting: Internal Medicine

## 2017-05-02 ENCOUNTER — Other Ambulatory Visit: Payer: Self-pay

## 2017-05-02 VITALS — BP 154/77 | HR 71 | Temp 98.6°F | Ht 71.0 in | Wt 334.8 lb

## 2017-05-02 DIAGNOSIS — G5600 Carpal tunnel syndrome, unspecified upper limb: Secondary | ICD-10-CM | POA: Diagnosis not present

## 2017-05-02 DIAGNOSIS — E611 Iron deficiency: Secondary | ICD-10-CM | POA: Diagnosis not present

## 2017-05-02 DIAGNOSIS — G629 Polyneuropathy, unspecified: Secondary | ICD-10-CM

## 2017-05-02 DIAGNOSIS — Z8719 Personal history of other diseases of the digestive system: Secondary | ICD-10-CM | POA: Diagnosis not present

## 2017-05-02 DIAGNOSIS — R110 Nausea: Secondary | ICD-10-CM | POA: Diagnosis not present

## 2017-05-02 DIAGNOSIS — I10 Essential (primary) hypertension: Secondary | ICD-10-CM

## 2017-05-02 DIAGNOSIS — G8929 Other chronic pain: Secondary | ICD-10-CM | POA: Diagnosis not present

## 2017-05-02 DIAGNOSIS — K589 Irritable bowel syndrome without diarrhea: Secondary | ICD-10-CM

## 2017-05-02 DIAGNOSIS — K219 Gastro-esophageal reflux disease without esophagitis: Secondary | ICD-10-CM

## 2017-05-02 DIAGNOSIS — M7989 Other specified soft tissue disorders: Secondary | ICD-10-CM

## 2017-05-02 DIAGNOSIS — Z79899 Other long term (current) drug therapy: Secondary | ICD-10-CM

## 2017-05-02 DIAGNOSIS — E538 Deficiency of other specified B group vitamins: Secondary | ICD-10-CM | POA: Insufficient documentation

## 2017-05-02 DIAGNOSIS — M79605 Pain in left leg: Secondary | ICD-10-CM

## 2017-05-02 DIAGNOSIS — E559 Vitamin D deficiency, unspecified: Secondary | ICD-10-CM

## 2017-05-02 DIAGNOSIS — M79604 Pain in right leg: Secondary | ICD-10-CM

## 2017-05-02 MED ORDER — HYOSCYAMINE SULFATE ER 0.375 MG PO TB12: 0.3750 mg | ORAL_TABLET | Freq: Two times a day (BID) | ORAL | 5 refills | Status: DC

## 2017-05-02 MED ORDER — OMEPRAZOLE 40 MG PO CPDR: 40.0000 mg | DELAYED_RELEASE_CAPSULE | Freq: Every day | ORAL | 11 refills | Status: DC

## 2017-05-02 MED ORDER — HYDROCHLOROTHIAZIDE 25 MG PO TABS: 25.0000 mg | ORAL_TABLET | Freq: Every day | ORAL | 2 refills | Status: DC

## 2017-05-02 MED ORDER — PROMETHAZINE HCL 25 MG PO TABS: 25.0000 mg | ORAL_TABLET | Freq: Two times a day (BID) | ORAL | 0 refills | Status: DC | PRN

## 2017-05-02 NOTE — Progress Notes (Signed)
   CC: Nausea f/u, leg pain  HPI:  [name] is a 47 y.o. female with past medical history outlined below here for follow up of nausea and chronic leg pain. For the details of today's visit, please refer to the assessment and plan.  Past Medical History:  Diagnosis Date  . Acid reflux   . Anemia    Iron Def  . Anorexia   . Chronic kidney disease    Nephrotic syndrome  . Colon polyp 2009  . Depression with anxiety   . Edema leg   . Fibromyalgia   . Hemorrhoids   . Hidradenitis suppurativa   . Hypertension   . IBS (irritable bowel syndrome)   . Low back pain   . Migraines   . Morbidly obese ([place])   . Neuromuscular disorder (HCC)    fibromyalgia  . Neuropathy   . Panic attacks   . Polyp of vocal cord or larynx   . Tonsil pain     Review of Systems  Gastrointestinal: Positive for abdominal pain and nausea. Negative for vomiting.   Physical Exam:  Vitals:   05/02/17 0933 05/02/17 1003  BP: (!) 174/96 (!) 154/77  Pulse: 81 71  Temp: 98.6 F (37 C)   TempSrc: Oral   SpO2: 95%   Weight: (!) 334 lb 12.8 oz (151.9 kg)   Height: 5\' 11"  (1.803 m)     Constitutional: Obese, NAD, appears comfortable Cardiovascular: RRR, no murmurs, rubs, or gallops.  Pulmonary/Chest: CTAB, no wheezes, rales, or rhonchi.  Abdominal: Soft, non tender, non distended. +BS.  Extremities: Warm and well perfused. No edema.  Neurological: A&Ox3, CN II - XII grossly intact. Proximal lower extremity muscle strength intact, decreased 4/5 distal lower extremity strength bilaterally (L>R) but also limited due to poor effort.. Arefelix patellar tendon bialterally. Sensation to light touch intact.  Psychiatric: Normal mood and affect  Assessment & Plan:   See Encounters Tab for problem based charting.  Patient discussed with Dr. Angelia Mould

## 2017-05-02 NOTE — Assessment & Plan Note (Signed)
Patient has chronic bilateral lower extremity leg pain which has been extensively worked up (normal ABIs 04/14/17, TSH, RPR, HIV, Vit B12, ACE, RF, ANA, SED rate, Lyme serologies, & MM panel - all negative / non contributory). Today she reports the pain, numbness, and tingling has spread to her bilateral fingers and hands causing her to frequently drop things. She does have iron and vitamin D deficiency which we are repleting in hopes of some improvement, however we have been unable to get her ferritin above goal of 70 or vitamin D over 20. I suspect she is non compliant with vitamin D supplements as her last prescription was almost 1 year ago, but she did refill her iron supplements two months ago. We will repeat levels today. She may ultimately require IV iron. She was referred to neurology for this leg pain and underwent nerve conduction studies on 04/01/16 consistent with a mild left lower extremity peripheral neuropathy and mild carpal tunnel. She has follow up scheduled with neurology in the next 1-2 months.  -- F/u pain management  -- F/u neurology  -- F/u repeat ferritin & vitamin D

## 2017-05-02 NOTE — Assessment & Plan Note (Addendum)
Reports she is still taking her vitamin D supplements however last prescription was written almost one year ago and she has not followed up. Rechecking level today.   ADDENDUM: Vitamin D low. Called patient with results, prescription sent to pharmacy. Repeat level 3 months.

## 2017-05-02 NOTE — Patient Instructions (Addendum)
FOLLOW-UP INSTRUCTIONS When: 3 months PCP For: BP & nausea f/u What to bring: Medications   Lindsay [name],  It was a pleasure to see you. For your nausea, I have restarted you on nulev. Please take this twice a day. I have also refilled your phenergan, please only take this when needed. Ideally, I would like to eventually get you off of this medicine.   For your leg pain, I am rechecking your iron levels, vitamin B12 levels, and vitamin D levels. I would like you to follow up with your neurologist, Dr. Jannifer Franklin. Please give his office a call to schedule an appointment.   Your blood pressure is elevated today. I have doubled the dose of your hydrochlorothiazide. You may take two pills until you run out then start your new prescription.  Please follow up with me again in 3 months. If you have any questions or concerns, call our clinic at [phone] or after hours call [phone] and ask for the internal medicine resident on call. Thank you!  - Dr. Philipp Ovens

## 2017-05-02 NOTE — Assessment & Plan Note (Addendum)
Rechecking ferritin. Continue iron supplements.   ADDENDUM: ferritin persistently low.  Refilled iron supplements.

## 2017-05-02 NOTE — Assessment & Plan Note (Signed)
Patient has a history of chronic uncontrolled nausea with multiple non specific GI complaints for which she takes PRN phenergan. She reports her nausea is constant, but sometimes worse after eating or taking medications causing her to dry heave. Nausea is sometimes accompanied by cramping suprapubic abdominal pain, but not always. She denies emesis or change in her stool. Of note she is on multiple psychiatric medications that could potentially exacerbate nausea. She asked her psychiatrist at her last office visit about these medications however was told it was unlikely the cause. She does have a prior history of IBS. She reports being on hyoscyamine BID and sublingual prn many years ago with relief of her nausea. It is possible this is a symptom of her IBS or a form of functional nausea. She is agreeable to restarting this medicine with the hopes of eventually stopping the phenergan. If nausea persist despite this, consider GI follow up for further evaluation.  -- Start hyoscyamine 0.375 mg BID -- Consider adding additional SL prn in future if needed  -- Refilled prn phenergan, plan to stop if improvement with hyoscyamine  -- Consider GI follow up if no improvement

## 2017-05-02 NOTE — Assessment & Plan Note (Addendum)
Last vitamin B12 level was low normal. In the setting of her peripheral neuropathy, will repeat level today.   ADDENDUM: vitamin B12 is low.  Supplements called into pharmacy.  Repeat level in 3 months.

## 2017-05-02 NOTE — Assessment & Plan Note (Addendum)
Blood pressure uncontrolled today, 174/96 and 154/77 on repeat.  -- Increase HCTZ 12.5 mg -> 25 mg daily  -- Follow up 3 months

## 2017-05-03 LAB — FERRITIN: FERRITIN: 17 ng/mL (ref 15–150)

## 2017-05-03 LAB — VITAMIN B12: Vitamin B-12: 216 pg/mL — ABNORMAL LOW (ref 232–1245)

## 2017-05-03 LAB — VITAMIN D 25 HYDROXY (VIT D DEFICIENCY, FRACTURES): VIT D 25 HYDROXY: 14.9 ng/mL — AB (ref 30.0–100.0)

## 2017-05-05 NOTE — Progress Notes (Signed)
Cardiology Office Note   Date:  05/06/2017   ID:  Lindsay [name], DOB 1970-07-29, MRN [id number]  PCP:  Velna Ochs, MD  Cardiologist:  New Chief Complaint  Patient presents with  . Palpitations    new patient  . Shortness of Breath     History of Present Illness: Lindsay [name] is a 47 y.o. female who presents at the request of Dr. Ina Homes for palpitations and mild chest discomfort. She is on disability due to chronic pain in her legs and feet. She has had ABI's which were negative on 04/11/2017. Negative sleep study in 2016.   She states HR increases with minimal exertion, walking or doing ADL's. She discussed this with her PCP who recommended that she have cardiac evaluation.    Past Medical History:  Diagnosis Date  . Acid reflux   . Anemia    Iron Def  . Anorexia   . Chronic kidney disease    Nephrotic syndrome  . Colon polyp 2009  . Depression with anxiety   . Edema leg   . Fibromyalgia   . Hemorrhoids   . Hidradenitis suppurativa   . Hypertension   . IBS (irritable bowel syndrome)   . Low back pain   . Migraines   . Morbidly obese ([place])   . Neuromuscular disorder (HCC)    fibromyalgia  . Neuropathy   . Panic attacks   . Polyp of vocal cord or larynx   . Tonsil pain     Past Surgical History:  Procedure Laterality Date  . AXILLARY HIDRADENITIS EXCISION    . COLONOSCOPY WITH PROPOFOL N/A 05/25/2015   Procedure: COLONOSCOPY WITH PROPOFOL;  Surgeon: Milus Banister, MD;  Location: [facility];  Service: Endoscopy;  Laterality: N/A;  . HEMORRHOID SURGERY     with Hidradenitis surgery   . INGUINAL HIDRADENITIS EXCISION    . TONSILLECTOMY  10/18/2010   by Dr. Wilburn Cornelia  . UPPER GASTROINTESTINAL ENDOSCOPY       Current Outpatient Medications  Medication Sig Dispense Refill  . benzonatate (TESSALON PERLES) 100 MG capsule Take 1 capsule (100 mg total) by mouth 3 (three) times daily as needed for cough. 30 capsule 0  . Brexpiprazole (REXULTI) 1 MG  TABS Take 1 tablet (1 mg total) by mouth daily. 90 tablet 0  . cholecalciferol (VITAMIN D) 1000 units tablet Take 1 tablet (1,000 Units total) by mouth daily. 30 tablet 3  . clindamycin (CLINDAGEL) 1 % gel Apply topically 2 (two) times daily. Apply to left axilla. 30 g 1  . clorazepate (TRANXENE) 15 MG tablet TAKE 1 TABLET BY MOUTH EVERY DAY IN THE MORNING 30 tablet 2  . DULoxetine (CYMBALTA) 60 MG capsule TAKE 1 CAPSULE (60 MG TOTAL) BY MOUTH 2 (TWO) TIMES DAILY. 60 capsule 4  . ferrous sulfate 325 (65 FE) MG tablet Take 1 tablet (325 mg total) by mouth daily. 30 tablet 3  . fluticasone (FLONASE) 50 MCG/ACT nasal spray Place 2 sprays into both nostrils daily. 16 g 2  . hydrochlorothiazide (HYDRODIURIL) 25 MG tablet Take 1 tablet (25 mg total) by mouth daily. 30 tablet 2  . hydrOXYzine (VISTARIL) 50 MG capsule TAKE 1 CAPSULE BY MOUTH DAILY AS NEEDED FOR ANXIETY. 90 capsule 0  . omeprazole (PRILOSEC) 40 MG capsule Take 1 capsule (40 mg total) by mouth daily. 30 capsule 11  . pregabalin (LYRICA) 100 MG capsule Take 1 capsule (100 mg total) by mouth 3 (three) times daily. 90 capsule 0  . promethazine (PHENERGAN)  25 MG tablet Take 1 tablet (25 mg total) by mouth 2 (two) times daily as needed for nausea or vomiting. 60 tablet 0  . vitamin B-12 (CYANOCOBALAMIN) 1000 MCG tablet Take 1 tablet (1,000 mcg total) by mouth daily. 30 tablet 3  . Vitamin D, Ergocalciferol, (DRISDOL) 50000 units CAPS capsule Take 1 capsule (50,000 Units total) by mouth every 7 (seven) days. 8 capsule 0   No current facility-administered medications for this visit.     Allergies:   Lisinopril    Social History:  The patient  reports that she has been smoking cigarettes.  She has a 12.50 pack-year smoking history. She has never used smokeless tobacco. She reports that she does not drink alcohol or use drugs.   Family History:  The patient's family history includes Anxiety disorder in her mother and sister; Cancer in her  father; Depression in her mother, sister, and sister; Diabetes in her mother; Heart disease in her father and mother; High blood pressure in her mother; Kidney failure in her mother; Learning disabilities in her sister.    ROS: All other systems are reviewed and negative. Unless otherwise mentioned in H&P    PHYSICAL EXAM: VS:  BP (!) 158/98 (BP Location: Left Arm)   Pulse 82   Ht 5\' 11"  (1.803 m)   Wt (!) 329 lb 12.8 oz (149.6 kg)   BMI 46.00 kg/m  , BMI Body mass index is 46 kg/m. GEN: Well nourished, well developed, in no acute distress  HEENT: normal  Neck: no JVD, carotid bruits, or masses Cardiac: RRR; no murmurs, rubs, or gallops,no edema  Respiratory:  clear to auscultation bilaterally, normal work of breathing GI: soft, nontender, nondistended, + BS MS: no deformity or atrophy  Skin: warm and dry, no rash Neuro:  [name] and sensation are intact Psych: euthymic mood, full affect   EKG:  NSR rate of 82 bpm mild LVH.  Recent Labs: No results found for requested labs within last 8760 hours.    Lipid Panel    Component Value Date/Time   CHOL 119 09/15/2013 1032   TRIG 38 09/15/2013 1032   HDL 58 09/15/2013 1032   CHOLHDL 2.1 09/15/2013 1032   VLDL 8 09/15/2013 1032   LDLCALC 53 09/15/2013 1032      Wt Readings from Last 3 Encounters:  05/06/17 (!) 329 lb 12.8 oz (149.6 kg)  05/02/17 (!) 334 lb 12.8 oz (151.9 kg)  04/11/17 (!) 326 lb 1.6 oz (147.9 kg)      Other studies Reviewed: Sleep study and ABI's as discussed above.    ASSESSMENT AND PLAN:  1. Frequent palpitations and heart racing: Self reported by patient and associated with minimal exertion. I will place a 30 day cardiac monitor to evaluate for frequency and morphology of her palpitations.   2. DOE: May be multifactorial with morbid obesity, deconditioning, and possible OSA. She has gained a considerable amount of weight and may need to be restudied, as this has not been completed since 2016. I  will check echocardiogram for structural heart disease and LV fx.   3. Hypertension: Elevated today. She is currently not on antihypertensive therapy, Will follow up on this. If continues elevated on next visit (this is a new patient visit), will need to start her on ARB or CCB. She is to have BMET today.   4. Unknown lipid status: Will check fasting lipids and LFT's   5. Morbid Obesity: I have looked at recent labs. TSH is normal. She  has not had Hgb A1C recently. Will check this on blood draw.   Current medicines are reviewed at length with the patient today.    Labs/ tests ordered today include: Echocardiogram, BMET, Hgb A1c. Cardiac monitor.   Phill Myron. [place] Pugh, ANP, AACC   05/06/2017 12:38 PM     [facility] [address], [place], [place] [address] Phone: [phone]; Fax: [phone]

## 2017-05-06 ENCOUNTER — Encounter: Payer: Self-pay | Admitting: Adult Health

## 2017-05-06 ENCOUNTER — Ambulatory Visit (INDEPENDENT_AMBULATORY_CARE_PROVIDER_SITE_OTHER): Payer: Medicare Other | Admitting: Adult Health

## 2017-05-06 VITALS — BP 158/98 | HR 82 | Ht 71.0 in | Wt 329.8 lb

## 2017-05-06 DIAGNOSIS — Z79899 Other long term (current) drug therapy: Secondary | ICD-10-CM | POA: Diagnosis not present

## 2017-05-06 DIAGNOSIS — I10 Essential (primary) hypertension: Secondary | ICD-10-CM | POA: Diagnosis not present

## 2017-05-06 DIAGNOSIS — R000 Tachycardia, unspecified: Secondary | ICD-10-CM

## 2017-05-06 DIAGNOSIS — R0609 Other forms of dyspnea: Secondary | ICD-10-CM

## 2017-05-06 MED ORDER — VITAMIN B-12 1000 MCG PO TABS: 1000.0000 ug | ORAL_TABLET | Freq: Every day | ORAL | 3 refills | Status: DC

## 2017-05-06 MED ORDER — VITAMIN D3 25 MCG (1000 UNIT) PO TABS: 1000.0000 [IU] | ORAL_TABLET | Freq: Every day | ORAL | 3 refills | Status: DC

## 2017-05-06 MED ORDER — FERROUS SULFATE 325 (65 FE) MG PO TABS: 325.0000 mg | ORAL_TABLET | Freq: Every day | ORAL | 3 refills | Status: DC

## 2017-05-06 NOTE — Patient Instructions (Signed)
If you need a refill on your cardiac medications before your next appointment, please call your pharmacy.  Labwork: A1C,LIPID,LFT AND BMET TODAY HERE IN OUR OFFICE AT [facility]  Take the provided lab slips for you to take with you to the lab for you blood draw.   Testing/Procedures: Echocardiogram - Your physician has requested that you have an echocardiogram. Echocardiography is a painless test that uses sound waves to create images of your heart. It provides your doctor with information about the size and shape of your heart and how well your heart's chambers and valves are working. This procedure takes approximately one hour. There are no restrictions for this procedure. This will be performed at our [facility] location - [address], [address].  Your physician has recommended that you wear an event monitor. This will be performed at our [facility] location - [address], [address].  Event monitors are medical devices that record the heart's electrical activity. Doctors most often [name] these monitors to diagnose arrhythmias. Arrhythmias are problems with the speed or rhythm of the heartbeat. The monitor is a small, portable device. You can wear one while you do your normal daily activities. This is usually used to diagnose what is causing palpitations/syncope (passing out).  Follow-Up: Your physician wants you to follow-up in: AFTER TESTING/MONITOR ~5 WEEKS WITH DR Stanford Breed   Thank you for choosing [facility] at [facility]!!

## 2017-05-06 NOTE — Addendum Note (Signed)
Addended by: Jodean Lima on: 05/06/2017 08:21 AM   Modules accepted: Orders

## 2017-05-07 LAB — LIPID PANEL
Chol/HDL Ratio: 2 ratio (ref 0.0–4.4)
Cholesterol, Total: 139 mg/dL (ref 100–199)
HDL: 69 mg/dL (ref >39)
LDL CALC: 61 mg/dL (ref 0–99)
Triglycerides: 47 mg/dL (ref 0–149)
VLDL Cholesterol Cal: 9 mg/dL (ref 5–40)

## 2017-05-07 LAB — BASIC METABOLIC PANEL
BUN / CREAT RATIO: 10 (ref 9–23)
BUN: 8 mg/dL (ref 6–24)
CO2: 27 mmol/L (ref 20–29)
CREATININE: 0.84 mg/dL (ref 0.57–1.00)
Calcium: 8.9 mg/dL (ref 8.7–10.2)
Chloride: 103 mmol/L (ref 96–106)
GFR, EST AFRICAN AMERICAN: 96 mL/min/{1.73_m2} (ref >59)
GFR, EST NON AFRICAN AMERICAN: 83 mL/min/{1.73_m2} (ref >59)
Glucose: 70 mg/dL (ref 65–99)
Potassium: 4.9 mmol/L (ref 3.5–5.2)
Sodium: 141 mmol/L (ref 134–144)

## 2017-05-07 LAB — HEMOGLOBIN A1C
ESTIMATED AVERAGE GLUCOSE: 111 mg/dL
HEMOGLOBIN A1C: 5.5 % (ref 4.8–5.6)

## 2017-05-07 LAB — HEPATIC FUNCTION PANEL
ALBUMIN: 4.1 g/dL (ref 3.5–5.5)
ALT: 11 IU/L (ref 0–32)
AST: 11 IU/L (ref 0–40)
Alkaline Phosphatase: 87 IU/L (ref 39–117)
Bilirubin Total: 0.3 mg/dL (ref 0.0–1.2)
Bilirubin, Direct: 0.1 mg/dL (ref 0.00–0.40)
TOTAL PROTEIN: 6.6 g/dL (ref 6.0–8.5)

## 2017-05-08 ENCOUNTER — Ambulatory Visit: Payer: Medicare Other | Admitting: Psychology

## 2017-05-08 NOTE — Progress Notes (Signed)
Internal Medicine Clinic Attending  Case discussed with Dr. Guilloud at the time of the visit.  We reviewed the resident's history and exam and pertinent patient test results.  I agree with the assessment, diagnosis, and plan of care documented in the resident's note.  

## 2017-05-18 ENCOUNTER — Other Ambulatory Visit: Payer: Self-pay | Admitting: Internal Medicine

## 2017-05-18 DIAGNOSIS — I10 Essential (primary) hypertension: Secondary | ICD-10-CM

## 2017-05-18 DIAGNOSIS — M7989 Other specified soft tissue disorders: Secondary | ICD-10-CM

## 2017-05-19 ENCOUNTER — Ambulatory Visit (HOSPITAL_COMMUNITY): Payer: Medicare Other | Attending: Internal Medicine

## 2017-05-19 ENCOUNTER — Other Ambulatory Visit: Payer: Self-pay

## 2017-05-19 ENCOUNTER — Ambulatory Visit (INDEPENDENT_AMBULATORY_CARE_PROVIDER_SITE_OTHER): Payer: Medicare Other

## 2017-05-19 DIAGNOSIS — Z79899 Other long term (current) drug therapy: Secondary | ICD-10-CM | POA: Diagnosis not present

## 2017-05-19 DIAGNOSIS — D649 Anemia, unspecified: Secondary | ICD-10-CM | POA: Diagnosis not present

## 2017-05-19 DIAGNOSIS — R000 Tachycardia, unspecified: Secondary | ICD-10-CM

## 2017-05-19 DIAGNOSIS — R609 Edema, unspecified: Secondary | ICD-10-CM | POA: Insufficient documentation

## 2017-05-19 DIAGNOSIS — N189 Chronic kidney disease, unspecified: Secondary | ICD-10-CM | POA: Diagnosis not present

## 2017-05-19 DIAGNOSIS — Z6841 Body Mass Index (BMI) 40.0 and over, adult: Secondary | ICD-10-CM | POA: Diagnosis not present

## 2017-05-19 DIAGNOSIS — R0609 Other forms of dyspnea: Secondary | ICD-10-CM | POA: Diagnosis not present

## 2017-05-19 DIAGNOSIS — G629 Polyneuropathy, unspecified: Secondary | ICD-10-CM | POA: Insufficient documentation

## 2017-05-19 DIAGNOSIS — I129 Hypertensive chronic kidney disease with stage 1 through stage 4 chronic kidney disease, or unspecified chronic kidney disease: Secondary | ICD-10-CM | POA: Insufficient documentation

## 2017-05-19 DIAGNOSIS — G43909 Migraine, unspecified, not intractable, without status migrainosus: Secondary | ICD-10-CM | POA: Diagnosis not present

## 2017-05-19 DIAGNOSIS — E669 Obesity, unspecified: Secondary | ICD-10-CM | POA: Diagnosis not present

## 2017-05-24 ENCOUNTER — Other Ambulatory Visit: Payer: Self-pay | Admitting: Internal Medicine

## 2017-05-24 DIAGNOSIS — I10 Essential (primary) hypertension: Secondary | ICD-10-CM

## 2017-05-31 ENCOUNTER — Other Ambulatory Visit: Payer: Self-pay | Admitting: Internal Medicine

## 2017-05-31 DIAGNOSIS — L732 Hidradenitis suppurativa: Secondary | ICD-10-CM

## 2017-06-16 NOTE — Progress Notes (Deleted)
HPI: Follow-up chest pain and palpitations.  Recently by Jory Sims for the symptoms.  Echocardiogram April 2019 showed normal LV systolic function, grade 1 diastolic dysfunction and mild left atrial enlargement.  Monitor showed .  Potassium 4.9.  Since last seen  Current Outpatient Medications  Medication Sig Dispense Refill  . benzonatate (TESSALON PERLES) 100 MG capsule Take 1 capsule (100 mg total) by mouth 3 (three) times daily as needed for cough. 30 capsule 0  . Brexpiprazole (REXULTI) 1 MG TABS Take 1 tablet (1 mg total) by mouth daily. 90 tablet 0  . cholecalciferol (VITAMIN D) 1000 units tablet Take 1 tablet (1,000 Units total) by mouth daily. 30 tablet 3  . clindamycin (CLINDAGEL) 1 % gel APPLY TOPICALLY 2 (TWO) TIMES DAILY. APPLY TO LEFT AXILLA. 30 g 0  . clorazepate (TRANXENE) 15 MG tablet TAKE 1 TABLET BY MOUTH EVERY DAY IN THE MORNING 30 tablet 2  . DULoxetine (CYMBALTA) 60 MG capsule TAKE 1 CAPSULE (60 MG TOTAL) BY MOUTH 2 (TWO) TIMES DAILY. 60 capsule 4  . ferrous sulfate 325 (65 FE) MG tablet Take 1 tablet (325 mg total) by mouth daily. 30 tablet 3  . fluticasone (FLONASE) 50 MCG/ACT nasal spray Place 2 sprays into both nostrils daily. 16 g 2  . hydrochlorothiazide (HYDRODIURIL) 25 MG tablet Take 1 tablet (25 mg total) by mouth daily. 30 tablet 2  . hydrOXYzine (VISTARIL) 50 MG capsule TAKE 1 CAPSULE BY MOUTH DAILY AS NEEDED FOR ANXIETY. 90 capsule 0  . omeprazole (PRILOSEC) 40 MG capsule Take 1 capsule (40 mg total) by mouth daily. 30 capsule 11  . pregabalin (LYRICA) 100 MG capsule Take 1 capsule (100 mg total) by mouth 3 (three) times daily. 90 capsule 0  . promethazine (PHENERGAN) 25 MG tablet Take 1 tablet (25 mg total) by mouth 2 (two) times daily as needed for nausea or vomiting. 60 tablet 0  . vitamin B-12 (CYANOCOBALAMIN) 1000 MCG tablet Take 1 tablet (1,000 mcg total) by mouth daily. 30 tablet 3  . Vitamin D, Ergocalciferol, (DRISDOL) 50000 units CAPS  capsule Take 1 capsule (50,000 Units total) by mouth every 7 (seven) days. 8 capsule 0   No current facility-administered medications for this visit.      Past Medical History:  Diagnosis Date  . Acid reflux   . Anemia    Iron Def  . Anorexia   . Chronic kidney disease    Nephrotic syndrome  . Colon polyp 2009  . Depression with anxiety   . Edema leg   . Fibromyalgia   . Hemorrhoids   . Hidradenitis suppurativa   . Hypertension   . IBS (irritable bowel syndrome)   . Low back pain   . Migraines   . Morbidly obese ([place])   . Neuromuscular disorder (HCC)    fibromyalgia  . Neuropathy   . Panic attacks   . Polyp of vocal cord or larynx   . Tonsil pain     Past Surgical History:  Procedure Laterality Date  . AXILLARY HIDRADENITIS EXCISION    . COLONOSCOPY WITH PROPOFOL N/A 05/25/2015   Procedure: COLONOSCOPY WITH PROPOFOL;  Surgeon: Milus Banister, MD;  Location: [facility];  Service: Endoscopy;  Laterality: N/A;  . HEMORRHOID SURGERY     with Hidradenitis surgery   . INGUINAL HIDRADENITIS EXCISION    . TONSILLECTOMY  10/18/2010   by Dr. Wilburn Cornelia  . UPPER GASTROINTESTINAL ENDOSCOPY      Social History  Socioeconomic History  . Marital status: Single    Spouse name: Not on file  . Number of children: 1  . Years of education: 76  . Highest education level: Not on file  Occupational History  . Occupation: Disabled  Social Needs  . Financial resource strain: Not on file  . Food insecurity:    Worry: Not on file    Inability: Not on file  . Transportation needs:    Medical: Not on file    Non-medical: Not on file  Tobacco Use  . Smoking status: Current Some Day Smoker    Packs/day: 0.50    Years: 25.00    Pack years: 12.50    Types: Cigarettes  . Smokeless tobacco: Never Used  . Tobacco comment: 5-7 cigarette a day  Substance and Sexual Activity  . Alcohol use: No    Alcohol/week: 0.0 oz    Comment: occassionally  . Drug use: No  . Sexual  activity: Not on file  Lifestyle  . Physical activity:    Days per week: Not on file    Minutes per session: Not on file  . Stress: Not on file  Relationships  . Social connections:    Talks on phone: Not on file    Gets together: Not on file    Attends religious service: Not on file    Active member of club or organization: Not on file    Attends meetings of clubs or organizations: Not on file    Relationship status: Not on file  . Intimate partner violence:    Fear of current or ex partner: Not on file    Emotionally abused: Not on file    Physically abused: Not on file    Forced sexual activity: Not on file  Other Topics Concern  . Not on file  Social History Narrative   Lives at home w/ her mother and daughter   Right-handed   Caffeine: about 3 Cokes per week    Family History  Problem Relation Age of Onset  . Diabetes Mother   . Heart disease Mother        valve leak  . Anxiety disorder Mother   . Depression Mother   . High blood pressure Mother   . Kidney failure Mother   . Cancer Father        prostate  . Heart disease Father   . Learning disabilities Sister   . Depression Sister   . Depression Sister   . Anxiety disorder Sister   . Colon cancer Neg Hx     ROS: no fevers or chills, productive cough, hemoptysis, dysphasia, odynophagia, melena, hematochezia, dysuria, hematuria, rash, seizure activity, orthopnea, PND, pedal edema, claudication. Remaining systems are negative.  Physical Exam: Well-developed well-nourished in no acute distress.  Skin is warm and dry.  HEENT is normal.  Neck is supple.  Chest is clear to auscultation with normal expansion.  Cardiovascular exam is regular rate and rhythm.  Abdominal exam nontender or distended. No masses palpated. Extremities show no edema. neuro grossly intact  ECG- personally reviewed  A/P  1  Kirk Ruths, MD

## 2017-06-24 ENCOUNTER — Ambulatory Visit: Payer: Medicare Other | Admitting: Cardiology

## 2017-07-01 ENCOUNTER — Ambulatory Visit: Payer: Medicare Other | Admitting: Psychology

## 2017-07-03 ENCOUNTER — Ambulatory Visit: Payer: Medicare Other | Admitting: Psychology

## 2017-07-05 ENCOUNTER — Other Ambulatory Visit: Payer: Self-pay | Admitting: Internal Medicine

## 2017-07-05 DIAGNOSIS — M79604 Pain in right leg: Secondary | ICD-10-CM

## 2017-07-05 DIAGNOSIS — M79605 Pain in left leg: Principal | ICD-10-CM

## 2017-07-10 ENCOUNTER — Other Ambulatory Visit: Payer: Self-pay | Admitting: Internal Medicine

## 2017-07-10 ENCOUNTER — Other Ambulatory Visit: Payer: Self-pay

## 2017-07-10 DIAGNOSIS — M79605 Pain in left leg: Principal | ICD-10-CM

## 2017-07-10 DIAGNOSIS — M79604 Pain in right leg: Secondary | ICD-10-CM

## 2017-07-10 DIAGNOSIS — L732 Hidradenitis suppurativa: Secondary | ICD-10-CM

## 2017-07-10 NOTE — Telephone Encounter (Signed)
promethazine (PHENERGAN) 25 MG tablet, [facility] [address].

## 2017-07-11 MED ORDER — PROMETHAZINE HCL 25 MG PO TABS: 25.0000 mg | ORAL_TABLET | Freq: Two times a day (BID) | ORAL | 0 refills | Status: DC | PRN

## 2017-07-11 MED ORDER — CLINDAMYCIN PHOSPHATE 1 % EX GEL: Freq: Two times a day (BID) | CUTANEOUS | 0 refills | Status: DC

## 2017-07-11 NOTE — Telephone Encounter (Signed)
I have approved one month of phenergan. Please have her schedule follow up either with me or in [facility] in the next month. Plan was to stop phenergan if nausea improved with hyoscyamine, or refer to GI if no improvement. I do not want to continue phenergan long term she has already been on this for too long. Thank you!

## 2017-07-14 NOTE — Progress Notes (Deleted)
Cardiology Office Note:    Date:  07/14/2017   ID:  [name], DOB 1970-02-12, MRN [id number]  PCP:  Velna Ochs, MD  Cardiologist:  Pixie Casino, MD   Referring MD: Velna Ochs, MD   No chief complaint on file. ***  History of Present Illness:    Lindsay [name] is a 47 y.o. female with a hx of HTN, migraines, morbid obesity, fibromyalgia, and palpitations. She is on disability for chronic pain in her legs and feet. ABIs negative 04/11/17.  She was first seen by [facility] 05/06/17 as a new patient to establish care at the request of her PCP for palpitations. She reported frequent palpitations, heart racing with minimal exertion, and DOE. 30-day event monitor was ordered along with lipids, LFTs, BMP, and A1c.  She presents today for follow up of her event monitor. Monitor showed normal sinus rhythm to sinus tachcyardia with PVCs. A1c was normal, lipids well-controlled, BMP WNL. She reports     Palpitations Will obtain a magnesium.   HTN Pressure remains elevated*** Will start losartan ***          Past Medical History:  Diagnosis Date  . Acid reflux   . Anemia    Iron Def  . Anorexia   . Chronic kidney disease    Nephrotic syndrome  . Colon polyp 2009  . Depression with anxiety   . Edema leg   . Fibromyalgia   . Hemorrhoids   . Hidradenitis suppurativa   . Hypertension   . IBS (irritable bowel syndrome)   . Low back pain   . Migraines   . Morbidly obese ([place])   . Neuromuscular disorder (HCC)    fibromyalgia  . Neuropathy   . Panic attacks   . Polyp of vocal cord or larynx   . Tonsil pain     Past Surgical History:  Procedure Laterality Date  . AXILLARY HIDRADENITIS EXCISION    . COLONOSCOPY WITH PROPOFOL N/A 05/25/2015   Procedure: COLONOSCOPY WITH PROPOFOL;  Surgeon: Milus Banister, MD;  Location: [facility];  Service: Endoscopy;  Laterality: N/A;  . HEMORRHOID SURGERY     with Hidradenitis surgery   . INGUINAL HIDRADENITIS  EXCISION    . TONSILLECTOMY  10/18/2010   by Dr. Wilburn Cornelia  . UPPER GASTROINTESTINAL ENDOSCOPY      Current Medications: No outpatient medications have been marked as taking for the 07/16/17 encounter (Appointment) with Almyra Deforest, [place].     Allergies:   Lisinopril   Social History   Socioeconomic History  . Marital status: Single    Spouse name: Not on file  . Number of children: 1  . Years of education: 16  . Highest education level: Not on file  Occupational History  . Occupation: Disabled  Social Needs  . Financial resource strain: Not on file  . Food insecurity:    Worry: Not on file    Inability: Not on file  . Transportation needs:    Medical: Not on file    Non-medical: Not on file  Tobacco Use  . Smoking status: Current Some Day Smoker    Packs/day: 0.50    Years: 25.00    Pack years: 12.50    Types: Cigarettes  . Smokeless tobacco: Never Used  . Tobacco comment: 5-7 cigarette a day  Substance and Sexual Activity  . Alcohol use: No    Alcohol/week: 0.0 oz    Comment: occassionally  . Drug use: No  . Sexual activity: Not on  file  Lifestyle  . Physical activity:    Days per week: Not on file    Minutes per session: Not on file  . Stress: Not on file  Relationships  . Social connections:    Talks on phone: Not on file    Gets together: Not on file    Attends religious service: Not on file    Active member of club or organization: Not on file    Attends meetings of clubs or organizations: Not on file    Relationship status: Not on file  Other Topics Concern  . Not on file  Social History Narrative   Lives at home w/ her mother and daughter   Right-handed   Caffeine: about 3 Cokes per week     Family History: The patient's ***family history includes Anxiety disorder in her mother and sister; Cancer in her father; Depression in her mother, sister, and sister; Diabetes in her mother; Heart disease in her father and mother; High blood pressure in her  mother; Kidney failure in her mother; Learning disabilities in her sister. There is no history of Colon cancer.  ROS:   Please see the history of present illness.    *** All other systems reviewed and are negative.  EKGs/Labs/Other Studies Reviewed:    The following studies were reviewed today: ***  EKG:  EKG is *** ordered today.  The ekg ordered today demonstrates ***  Recent Labs: 05/06/2017: ALT 11; BUN 8; Creatinine, Ser 0.84; Potassium 4.9; Sodium 141  Recent Lipid Panel    Component Value Date/Time   CHOL 139 05/06/2017 1114   TRIG 47 05/06/2017 1114   HDL 69 05/06/2017 1114   CHOLHDL 2.0 05/06/2017 1114   CHOLHDL 2.1 09/15/2013 1032   VLDL 8 09/15/2013 1032   LDLCALC 61 05/06/2017 1114    Physical Exam:    VS:  There were no vitals taken for this visit.    Wt Readings from Last 3 Encounters:  05/06/17 (!) 329 lb 12.8 oz (149.6 kg)  05/02/17 (!) 334 lb 12.8 oz (151.9 kg)  04/11/17 (!) 326 lb 1.6 oz (147.9 kg)     GEN: *** Well nourished, well developed in no acute distress HEENT: Normal NECK: No JVD; No carotid bruits LYMPHATICS: No lymphadenopathy CARDIAC: ***RRR, no murmurs, rubs, gallops RESPIRATORY:  Clear to auscultation without rales, wheezing or rhonchi  ABDOMEN: Soft, non-tender, non-distended MUSCULOSKELETAL:  No edema; No deformity  SKIN: Warm and dry NEUROLOGIC:  Alert and oriented x 3 PSYCHIATRIC:  Normal affect   ASSESSMENT:    No diagnosis found. PLAN:    In order of problems listed above:  No diagnosis found.   Medication Adjustments/Labs and Tests Ordered: Current medicines are reviewed at length with the patient today.  Concerns regarding medicines are outlined above.  No orders of the defined types were placed in this encounter.  No orders of the defined types were placed in this encounter.   Signed, Ledora Bottcher, [place]  07/14/2017 4:27 PM    [facility]

## 2017-07-15 NOTE — Telephone Encounter (Signed)
Rx sent in on 06/14 #60-request denied

## 2017-07-16 ENCOUNTER — Encounter

## 2017-07-16 ENCOUNTER — Ambulatory Visit: Payer: Medicare Other | Admitting: Physician Assistant

## 2017-07-23 ENCOUNTER — Ambulatory Visit (HOSPITAL_COMMUNITY): Payer: Medicare Other | Admitting: Psychiatry

## 2017-07-25 ENCOUNTER — Other Ambulatory Visit (HOSPITAL_COMMUNITY): Payer: Self-pay | Admitting: Psychiatry

## 2017-07-25 DIAGNOSIS — F331 Major depressive disorder, recurrent, moderate: Secondary | ICD-10-CM

## 2017-07-27 ENCOUNTER — Other Ambulatory Visit (HOSPITAL_COMMUNITY): Payer: Self-pay | Admitting: Psychiatry

## 2017-07-27 DIAGNOSIS — F331 Major depressive disorder, recurrent, moderate: Secondary | ICD-10-CM

## 2017-08-01 ENCOUNTER — Telehealth (HOSPITAL_COMMUNITY): Payer: Self-pay

## 2017-08-01 NOTE — Telephone Encounter (Signed)
Medication refill request - [facility] of Rexulti, last ordered 04/22/17 for 90 days.  Patient no showed 07/23/17 and no follow up scheduled currently.

## 2017-08-04 ENCOUNTER — Other Ambulatory Visit: Payer: Self-pay | Admitting: Neurology

## 2017-08-06 NOTE — Telephone Encounter (Signed)
A new one time 30 day order for patient's Hydroxyzine 50 mg approved by Dr. Dwyane Dee as patient no showed for appointment 07/23/17 and requested patient call for a new appointment.

## 2017-08-08 ENCOUNTER — Other Ambulatory Visit: Payer: Self-pay | Admitting: Internal Medicine

## 2017-08-08 DIAGNOSIS — I10 Essential (primary) hypertension: Secondary | ICD-10-CM

## 2017-08-08 NOTE — Telephone Encounter (Signed)
Next appt scheduled 10/21 with PCP.

## 2017-08-19 ENCOUNTER — Ambulatory Visit: Payer: Self-pay | Admitting: Neurology

## 2017-08-19 ENCOUNTER — Telehealth: Payer: Self-pay | Admitting: Neurology

## 2017-08-19 NOTE — Telephone Encounter (Signed)
This patient did not show for a revisit appointment today. 

## 2017-08-20 ENCOUNTER — Encounter: Payer: Self-pay | Admitting: Neurology

## 2017-08-23 ENCOUNTER — Other Ambulatory Visit: Payer: Self-pay | Admitting: Internal Medicine

## 2017-08-23 DIAGNOSIS — M79604 Pain in right leg: Secondary | ICD-10-CM

## 2017-08-23 DIAGNOSIS — M79605 Pain in left leg: Principal | ICD-10-CM

## 2017-08-25 ENCOUNTER — Telehealth: Payer: Self-pay | Admitting: *Deleted

## 2017-08-25 NOTE — Telephone Encounter (Signed)
Return letter for missed appt.

## 2017-08-28 ENCOUNTER — Other Ambulatory Visit: Payer: Self-pay | Admitting: Internal Medicine

## 2017-08-28 DIAGNOSIS — M79604 Pain in right leg: Secondary | ICD-10-CM

## 2017-08-28 DIAGNOSIS — M79605 Pain in left leg: Principal | ICD-10-CM

## 2017-08-28 NOTE — Telephone Encounter (Signed)
Next appt scheduled 10/21 with PCP.

## 2017-08-29 NOTE — Telephone Encounter (Signed)
Refill for phenergan declined. I approved a 1 month refill on 6/14 with the stipulation that she needed to be seen in the next 30 days. She has not followed up. I am in [facility] until 8/16. Please have her schedule an appointment. Thanks.

## 2017-08-29 NOTE — Telephone Encounter (Signed)
Called pt - no answer; left message to call the office about scheduling an appt by 8/16.

## 2017-08-29 NOTE — Telephone Encounter (Signed)
Return call from pt - explained she needs to be seen earlier than 10/21 to be eval for Phenergan refill. Appt scheduled on Monday 8/5 @ 0915 AM in [facility].

## 2017-09-01 ENCOUNTER — Encounter: Payer: Self-pay | Admitting: Internal Medicine

## 2017-09-01 ENCOUNTER — Ambulatory Visit (INDEPENDENT_AMBULATORY_CARE_PROVIDER_SITE_OTHER): Payer: Medicare Other | Admitting: Internal Medicine

## 2017-09-01 ENCOUNTER — Other Ambulatory Visit: Payer: Self-pay

## 2017-09-01 VITALS — BP 145/86 | HR 80 | Temp 98.6°F | Ht 71.0 in | Wt 331.4 lb

## 2017-09-01 DIAGNOSIS — R110 Nausea: Secondary | ICD-10-CM | POA: Diagnosis not present

## 2017-09-01 DIAGNOSIS — Z79899 Other long term (current) drug therapy: Secondary | ICD-10-CM | POA: Diagnosis not present

## 2017-09-01 DIAGNOSIS — W19XXXA Unspecified fall, initial encounter: Secondary | ICD-10-CM

## 2017-09-01 DIAGNOSIS — M62830 Muscle spasm of back: Secondary | ICD-10-CM

## 2017-09-01 DIAGNOSIS — Z6841 Body Mass Index (BMI) 40.0 and over, adult: Secondary | ICD-10-CM

## 2017-09-01 MED ORDER — PROMETHAZINE HCL 25 MG PO TABS: 25.0000 mg | ORAL_TABLET | Freq: Two times a day (BID) | ORAL | 0 refills | Status: DC | PRN

## 2017-09-01 MED ORDER — CYCLOBENZAPRINE HCL 5 MG PO TABS: 5.0000 mg | ORAL_TABLET | Freq: Three times a day (TID) | ORAL | 0 refills | Status: DC | PRN

## 2017-09-01 NOTE — Assessment & Plan Note (Signed)
Patient reports a fall Friday afternoon while she was moving out of her apartment and wearing flip flops. Her flip flops folded back on themselves and she fell onto her left side onto the concrete. She has been having back spasms every since. No relief with OTC NSAIDs or tylenol.  -- Flexeril 5 mg prn (#10 tabs) -- Reassure patient MSK may take weeks to heal  -- Follow up as needed

## 2017-09-01 NOTE — Assessment & Plan Note (Signed)
Patient has a difficult case of chronic nausea, I suspect is either functional with underlying IBS vs. Medication induced from her antipsychotics. Weight today and per chart review is stable. She denies emesis. No abdominal pain, no change in BMs. I have been unsuccessful in getting her off chronic phenergan. At her last appointment, she told me she was on an antispasmodic years ago that she found helpful. I prescribed hyoscyamine unfortunately she was unable to afford the medicine and did not follow up appropriately. She is here today requesting a refill of her phenergan. We discussed again today that phenergan is not appropriate chronic medication, and that we need to figure out what is causing her nausea. She is agreeable to follow up with her GI doctor, Dr. Ardis Hughs.  -- Refilled 1 month supply of phenergan to bridge her to see GI -- Re-referred to GI, appreciate any recommendations

## 2017-09-01 NOTE — Progress Notes (Signed)
Internal Medicine Clinic Attending  Case discussed with Dr. Guilloud at the time of the visit.  We reviewed the resident's history and exam and pertinent patient test results.  I agree with the assessment, diagnosis, and plan of care documented in the resident's note.  

## 2017-09-01 NOTE — Patient Instructions (Signed)
Ms. Glaus,  It was a pleasure to see you. I am sorry to hear you are still having issues with nausea. I have refilled your phenergan and put in a referral for you to see Dr. Ardis Hughs. You will be called to schedule this appointment. If you have any questions or concerns, call our clinic at [phone] or after hours call [phone] and ask for the internal medicine resident on call. Thank you!  - Dr. Philipp Ovens

## 2017-09-01 NOTE — Progress Notes (Signed)
   CC: Chronic nausea follow up   HPI:  [name] is a 47 y.o. female with past medical history outlined below here for chronic nausea follow up. For the details of today's visit, please refer to the assessment and plan.  Past Medical History:  Diagnosis Date  . Acid reflux   . Anemia    Iron Def  . Anorexia   . Chronic kidney disease    Nephrotic syndrome  . Colon polyp 2009  . Depression with anxiety   . Edema leg   . Fibromyalgia   . Hemorrhoids   . Hidradenitis suppurativa   . Hypertension   . IBS (irritable bowel syndrome)   . Low back pain   . Migraines   . Morbidly obese ([place])   . Neuromuscular disorder (HCC)    fibromyalgia  . Neuropathy   . Panic attacks   . Polyp of vocal cord or larynx   . Tonsil pain     Review of Systems  Gastrointestinal: Positive for nausea. Negative for vomiting.    Physical Exam:  Vitals:   09/01/17 0857  BP: (!) 145/86  Pulse: 80  Temp: 98.6 F (37 C)  TempSrc: Oral  SpO2: 97%  Weight: (!) 331 lb 6.4 oz (150.3 kg)  Height: 5\' 11"  (1.803 m)   Constitutional: Morbidly obese, NAD, appears comfortable Cardiovascular: RRR, no murmurs, rubs, or gallops.  Pulmonary/Chest: CTAB, no wheezes, rales, or rhonchi.  Abdominal: Soft, non tender, non distended. +BS.  Extremities: Warm and well perfused. No edema.   Assessment & Plan:   See Encounters Tab for problem based charting.  Patient discussed with Dr. Dareen Piano

## 2017-09-12 ENCOUNTER — Other Ambulatory Visit (HOSPITAL_COMMUNITY): Payer: Self-pay | Admitting: Psychiatry

## 2017-09-12 DIAGNOSIS — F331 Major depressive disorder, recurrent, moderate: Secondary | ICD-10-CM

## 2017-09-15 ENCOUNTER — Encounter: Payer: Self-pay | Admitting: Internal Medicine

## 2017-09-24 ENCOUNTER — Other Ambulatory Visit (HOSPITAL_COMMUNITY): Payer: Self-pay | Admitting: Psychiatry

## 2017-09-24 DIAGNOSIS — F331 Major depressive disorder, recurrent, moderate: Secondary | ICD-10-CM

## 2017-09-26 ENCOUNTER — Other Ambulatory Visit (HOSPITAL_COMMUNITY): Payer: Self-pay | Admitting: Psychiatry

## 2017-09-26 DIAGNOSIS — F331 Major depressive disorder, recurrent, moderate: Secondary | ICD-10-CM

## 2017-10-01 ENCOUNTER — Other Ambulatory Visit (HOSPITAL_COMMUNITY): Payer: Self-pay

## 2017-10-01 DIAGNOSIS — I10 Essential (primary) hypertension: Secondary | ICD-10-CM

## 2017-10-01 DIAGNOSIS — F331 Major depressive disorder, recurrent, moderate: Secondary | ICD-10-CM

## 2017-10-01 MED ORDER — CLORAZEPATE DIPOTASSIUM 15 MG PO TABS: ORAL_TABLET | ORAL | 0 refills | Status: DC

## 2017-10-01 MED ORDER — HYDROXYZINE PAMOATE 50 MG PO CAPS: ORAL_CAPSULE | ORAL | 0 refills | Status: DC

## 2017-10-01 MED ORDER — BREXPIPRAZOLE 1 MG PO TABS: 1.0000 | ORAL_TABLET | Freq: Every day | ORAL | 0 refills | Status: DC

## 2017-10-23 ENCOUNTER — Ambulatory Visit (INDEPENDENT_AMBULATORY_CARE_PROVIDER_SITE_OTHER): Payer: Medicare Other | Admitting: Psychiatry

## 2017-10-23 ENCOUNTER — Encounter (HOSPITAL_COMMUNITY): Payer: Self-pay | Admitting: Psychiatry

## 2017-10-23 VITALS — BP 126/84 | HR 90 | Ht 69.25 in | Wt 328.0 lb

## 2017-10-23 DIAGNOSIS — F4323 Adjustment disorder with mixed anxiety and depressed mood: Secondary | ICD-10-CM

## 2017-10-23 DIAGNOSIS — F17210 Nicotine dependence, cigarettes, uncomplicated: Secondary | ICD-10-CM | POA: Diagnosis not present

## 2017-10-23 DIAGNOSIS — F331 Major depressive disorder, recurrent, moderate: Secondary | ICD-10-CM

## 2017-10-23 MED ORDER — HYDROXYZINE PAMOATE 50 MG PO CAPS: ORAL_CAPSULE | ORAL | 1 refills | Status: DC

## 2017-10-23 MED ORDER — BREXPIPRAZOLE 1 MG PO TABS: 2.0000 | ORAL_TABLET | Freq: Every day | ORAL | 1 refills | Status: DC

## 2017-10-23 MED ORDER — CLORAZEPATE DIPOTASSIUM 15 MG PO TABS: ORAL_TABLET | ORAL | 1 refills | Status: DC
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
## 2017-10-25 ENCOUNTER — Other Ambulatory Visit: Payer: Self-pay | Admitting: Internal Medicine

## 2017-10-25 ENCOUNTER — Ambulatory Visit (HOSPITAL_COMMUNITY): Payer: Self-pay | Admitting: Psychiatry

## 2017-10-25 DIAGNOSIS — R110 Nausea: Secondary | ICD-10-CM

## 2017-10-27 ENCOUNTER — Telehealth (HOSPITAL_COMMUNITY): Payer: Self-pay

## 2017-10-27 NOTE — Telephone Encounter (Signed)
Next appt scheduled 10/21 with PCP.

## 2017-10-27 NOTE — Telephone Encounter (Signed)
Patient called and said that her Rexulti was increased from 1mg  to 2mg  and the insurance will not pay for the 2mg . Should it be back decrease or changed to something different? Please review and advise

## 2017-10-28 ENCOUNTER — Other Ambulatory Visit (HOSPITAL_COMMUNITY): Payer: Self-pay | Admitting: Psychiatry

## 2017-10-28 DIAGNOSIS — F331 Major depressive disorder, recurrent, moderate: Secondary | ICD-10-CM

## 2017-10-28 NOTE — Telephone Encounter (Signed)
We can provide samples of Rexulti 2 mg until she comes for her appointment.  We will discuss other options on her next appointment.  Please call patient to pick up the samples.

## 2017-10-28 NOTE — Telephone Encounter (Signed)
Refill declined. We discussed at her last appointment that I will not continue filling her phenergan, she needs to follow up with GI (Dr. Ardis Hughs) for her chronic nausea.

## 2017-10-30 NOTE — Telephone Encounter (Signed)
Patient came to pick up samples on 10-29-17.

## 2017-10-31 ENCOUNTER — Other Ambulatory Visit: Payer: Self-pay | Admitting: Internal Medicine

## 2017-10-31 DIAGNOSIS — R110 Nausea: Secondary | ICD-10-CM

## 2017-11-07 ENCOUNTER — Encounter (HOSPITAL_COMMUNITY): Payer: Self-pay

## 2017-11-07 ENCOUNTER — Telehealth: Payer: Self-pay

## 2017-11-07 ENCOUNTER — Inpatient Hospital Stay (HOSPITAL_COMMUNITY)
Admission: EM | Admit: 2017-11-07 | Discharge: 2017-11-09 | DRG: 202 | Disposition: A | Discharge status: Home / Self Care | Payer: Medicare Other | Attending: Internal Medicine | Admitting: Internal Medicine

## 2017-11-07 ENCOUNTER — Other Ambulatory Visit: Payer: Self-pay

## 2017-11-07 ENCOUNTER — Emergency Department (HOSPITAL_COMMUNITY): Payer: Medicare Other

## 2017-11-07 ENCOUNTER — Telehealth: Payer: Self-pay | Admitting: *Deleted

## 2017-11-07 DIAGNOSIS — K589 Irritable bowel syndrome without diarrhea: Secondary | ICD-10-CM | POA: Diagnosis present

## 2017-11-07 DIAGNOSIS — N926 Irregular menstruation, unspecified: Secondary | ICD-10-CM | POA: Diagnosis not present

## 2017-11-07 DIAGNOSIS — I509 Heart failure, unspecified: Secondary | ICD-10-CM | POA: Diagnosis not present

## 2017-11-07 DIAGNOSIS — Z8042 Family history of malignant neoplasm of prostate: Secondary | ICD-10-CM

## 2017-11-07 DIAGNOSIS — R0602 Shortness of breath: Secondary | ICD-10-CM | POA: Diagnosis not present

## 2017-11-07 DIAGNOSIS — R079 Chest pain, unspecified: Secondary | ICD-10-CM | POA: Diagnosis not present

## 2017-11-07 DIAGNOSIS — Z79899 Other long term (current) drug therapy: Secondary | ICD-10-CM

## 2017-11-07 DIAGNOSIS — J181 Lobar pneumonia, unspecified organism: Secondary | ICD-10-CM | POA: Diagnosis not present

## 2017-11-07 DIAGNOSIS — D509 Iron deficiency anemia, unspecified: Secondary | ICD-10-CM | POA: Diagnosis present

## 2017-11-07 DIAGNOSIS — F411 Generalized anxiety disorder: Secondary | ICD-10-CM | POA: Diagnosis present

## 2017-11-07 DIAGNOSIS — R05 Cough: Secondary | ICD-10-CM | POA: Diagnosis not present

## 2017-11-07 DIAGNOSIS — J9621 Acute and chronic respiratory failure with hypoxia: Secondary | ICD-10-CM | POA: Diagnosis not present

## 2017-11-07 DIAGNOSIS — Z818 Family history of other mental and behavioral disorders: Secondary | ICD-10-CM

## 2017-11-07 DIAGNOSIS — L732 Hidradenitis suppurativa: Secondary | ICD-10-CM | POA: Diagnosis present

## 2017-11-07 DIAGNOSIS — Z8249 Family history of ischemic heart disease and other diseases of the circulatory system: Secondary | ICD-10-CM

## 2017-11-07 DIAGNOSIS — Z23 Encounter for immunization: Secondary | ICD-10-CM

## 2017-11-07 DIAGNOSIS — Z888 Allergy status to other drugs, medicaments and biological substances status: Secondary | ICD-10-CM | POA: Diagnosis not present

## 2017-11-07 DIAGNOSIS — K219 Gastro-esophageal reflux disease without esophagitis: Secondary | ICD-10-CM | POA: Diagnosis present

## 2017-11-07 DIAGNOSIS — J189 Pneumonia, unspecified organism: Secondary | ICD-10-CM | POA: Diagnosis present

## 2017-11-07 DIAGNOSIS — J9620 Acute and chronic respiratory failure, unspecified whether with hypoxia or hypercapnia: Secondary | ICD-10-CM | POA: Diagnosis not present

## 2017-11-07 DIAGNOSIS — Z833 Family history of diabetes mellitus: Secondary | ICD-10-CM

## 2017-11-07 DIAGNOSIS — J206 Acute bronchitis due to rhinovirus: Secondary | ICD-10-CM | POA: Diagnosis present

## 2017-11-07 DIAGNOSIS — J9611 Chronic respiratory failure with hypoxia: Secondary | ICD-10-CM

## 2017-11-07 DIAGNOSIS — Z841 Family history of disorders of kidney and ureter: Secondary | ICD-10-CM

## 2017-11-07 DIAGNOSIS — F17210 Nicotine dependence, cigarettes, uncomplicated: Secondary | ICD-10-CM | POA: Diagnosis present

## 2017-11-07 DIAGNOSIS — F329 Major depressive disorder, single episode, unspecified: Secondary | ICD-10-CM | POA: Diagnosis present

## 2017-11-07 DIAGNOSIS — Z6841 Body Mass Index (BMI) 40.0 and over, adult: Secondary | ICD-10-CM

## 2017-11-07 DIAGNOSIS — I5033 Acute on chronic diastolic (congestive) heart failure: Secondary | ICD-10-CM | POA: Diagnosis not present

## 2017-11-07 DIAGNOSIS — E559 Vitamin D deficiency, unspecified: Secondary | ICD-10-CM | POA: Diagnosis not present

## 2017-11-07 DIAGNOSIS — Z86010 Personal history of colonic polyps: Secondary | ICD-10-CM

## 2017-11-07 DIAGNOSIS — J9601 Acute respiratory failure with hypoxia: Secondary | ICD-10-CM | POA: Diagnosis not present

## 2017-11-07 DIAGNOSIS — Z87441 Personal history of nephrotic syndrome: Secondary | ICD-10-CM | POA: Diagnosis not present

## 2017-11-07 DIAGNOSIS — M25512 Pain in left shoulder: Secondary | ICD-10-CM | POA: Diagnosis not present

## 2017-11-07 DIAGNOSIS — R0902 Hypoxemia: Secondary | ICD-10-CM | POA: Diagnosis not present

## 2017-11-07 DIAGNOSIS — I110 Hypertensive heart disease with heart failure: Secondary | ICD-10-CM | POA: Diagnosis present

## 2017-11-07 DIAGNOSIS — F339 Major depressive disorder, recurrent, unspecified: Secondary | ICD-10-CM | POA: Diagnosis not present

## 2017-11-07 DIAGNOSIS — M797 Fibromyalgia: Secondary | ICD-10-CM | POA: Diagnosis not present

## 2017-11-07 DIAGNOSIS — E669 Obesity, unspecified: Secondary | ICD-10-CM | POA: Diagnosis not present

## 2017-11-07 DIAGNOSIS — M542 Cervicalgia: Secondary | ICD-10-CM | POA: Diagnosis not present

## 2017-11-07 DIAGNOSIS — K5900 Constipation, unspecified: Secondary | ICD-10-CM | POA: Diagnosis present

## 2017-11-07 DIAGNOSIS — M25511 Pain in right shoulder: Secondary | ICD-10-CM | POA: Diagnosis not present

## 2017-11-07 LAB — RESPIRATORY PANEL BY PCR
Adenovirus: NOT DETECTED
Bordetella pertussis: NOT DETECTED
Chlamydophila pneumoniae: NOT DETECTED
Coronavirus 229E: NOT DETECTED
Coronavirus HKU1: NOT DETECTED
Coronavirus NL63: NOT DETECTED
Coronavirus OC43: NOT DETECTED
Influenza A H1 2009: NOT DETECTED
Influenza A H1: NOT DETECTED
Influenza A H3: NOT DETECTED
Influenza A: NOT DETECTED
Influenza B: NOT DETECTED
Metapneumovirus: NOT DETECTED
Mycoplasma pneumoniae: NOT DETECTED
Parainfluenza Virus 1: NOT DETECTED
Parainfluenza Virus 2: NOT DETECTED
Parainfluenza Virus 3: NOT DETECTED
Parainfluenza Virus 4: NOT DETECTED
Respiratory Syncytial Virus: NOT DETECTED
Rhinovirus / Enterovirus: DETECTED — AB

## 2017-11-07 LAB — CBC
HCT: 39.5 % (ref 36.0–46.0)
Hemoglobin: 11.8 g/dL — ABNORMAL LOW (ref 12.0–15.0)
MCH: 23.6 pg — ABNORMAL LOW (ref 26.0–34.0)
MCHC: 29.9 g/dL — ABNORMAL LOW (ref 30.0–36.0)
MCV: 79 fL — ABNORMAL LOW (ref 80.0–100.0)
Platelets: 387 10*3/uL (ref 150–400)
RBC: 5 MIL/uL (ref 3.87–5.11)
RDW: 19.4 % — ABNORMAL HIGH (ref 11.5–15.5)
WBC: 9.4 10*3/uL (ref 4.0–10.5)
nRBC: 0 % (ref 0.0–0.2)

## 2017-11-07 LAB — BASIC METABOLIC PANEL
Anion gap: 5 (ref 5–15)
BUN: 7 mg/dL (ref 6–20)
CO2: 27 mmol/L (ref 22–32)
Calcium: 8.4 mg/dL — ABNORMAL LOW (ref 8.9–10.3)
Chloride: 106 mmol/L (ref 98–111)
Creatinine, Ser: 0.82 mg/dL (ref 0.44–1.00)
GFR calc Af Amer: >60 mL/min (ref >60)
GFR calc non Af Amer: >60 mL/min (ref >60)
Glucose, Bld: 82 mg/dL (ref 70–99)
Potassium: 3.9 mmol/L (ref 3.5–5.1)
Sodium: 138 mmol/L (ref 135–145)

## 2017-11-07 LAB — I-STAT TROPONIN, ED: Troponin i, poc: 0 ng/mL (ref 0.00–0.08)

## 2017-11-07 LAB — I-STAT BETA HCG BLOOD, ED (MC, WL, AP ONLY): I-stat hCG, quantitative: <5 m[IU]/mL (ref <5)

## 2017-11-07 LAB — BRAIN NATRIURETIC PEPTIDE: B Natriuretic Peptide: 295.5 pg/mL — ABNORMAL HIGH (ref 0.0–100.0)

## 2017-11-07 LAB — PROCALCITONIN: Procalcitonin: <0.1 ng/mL

## 2017-11-07 IMAGING — DX DG CHEST 2V
2 series · 2 of 2 positions shown · non-contrast
Comparison: Chest x-ray dated [DATE].

CLINICAL DATA: Shortness of breath and productive cough for the
past 3-4 days. Fever.

EXAM:
CHEST - 2 VIEW

[chest pa]
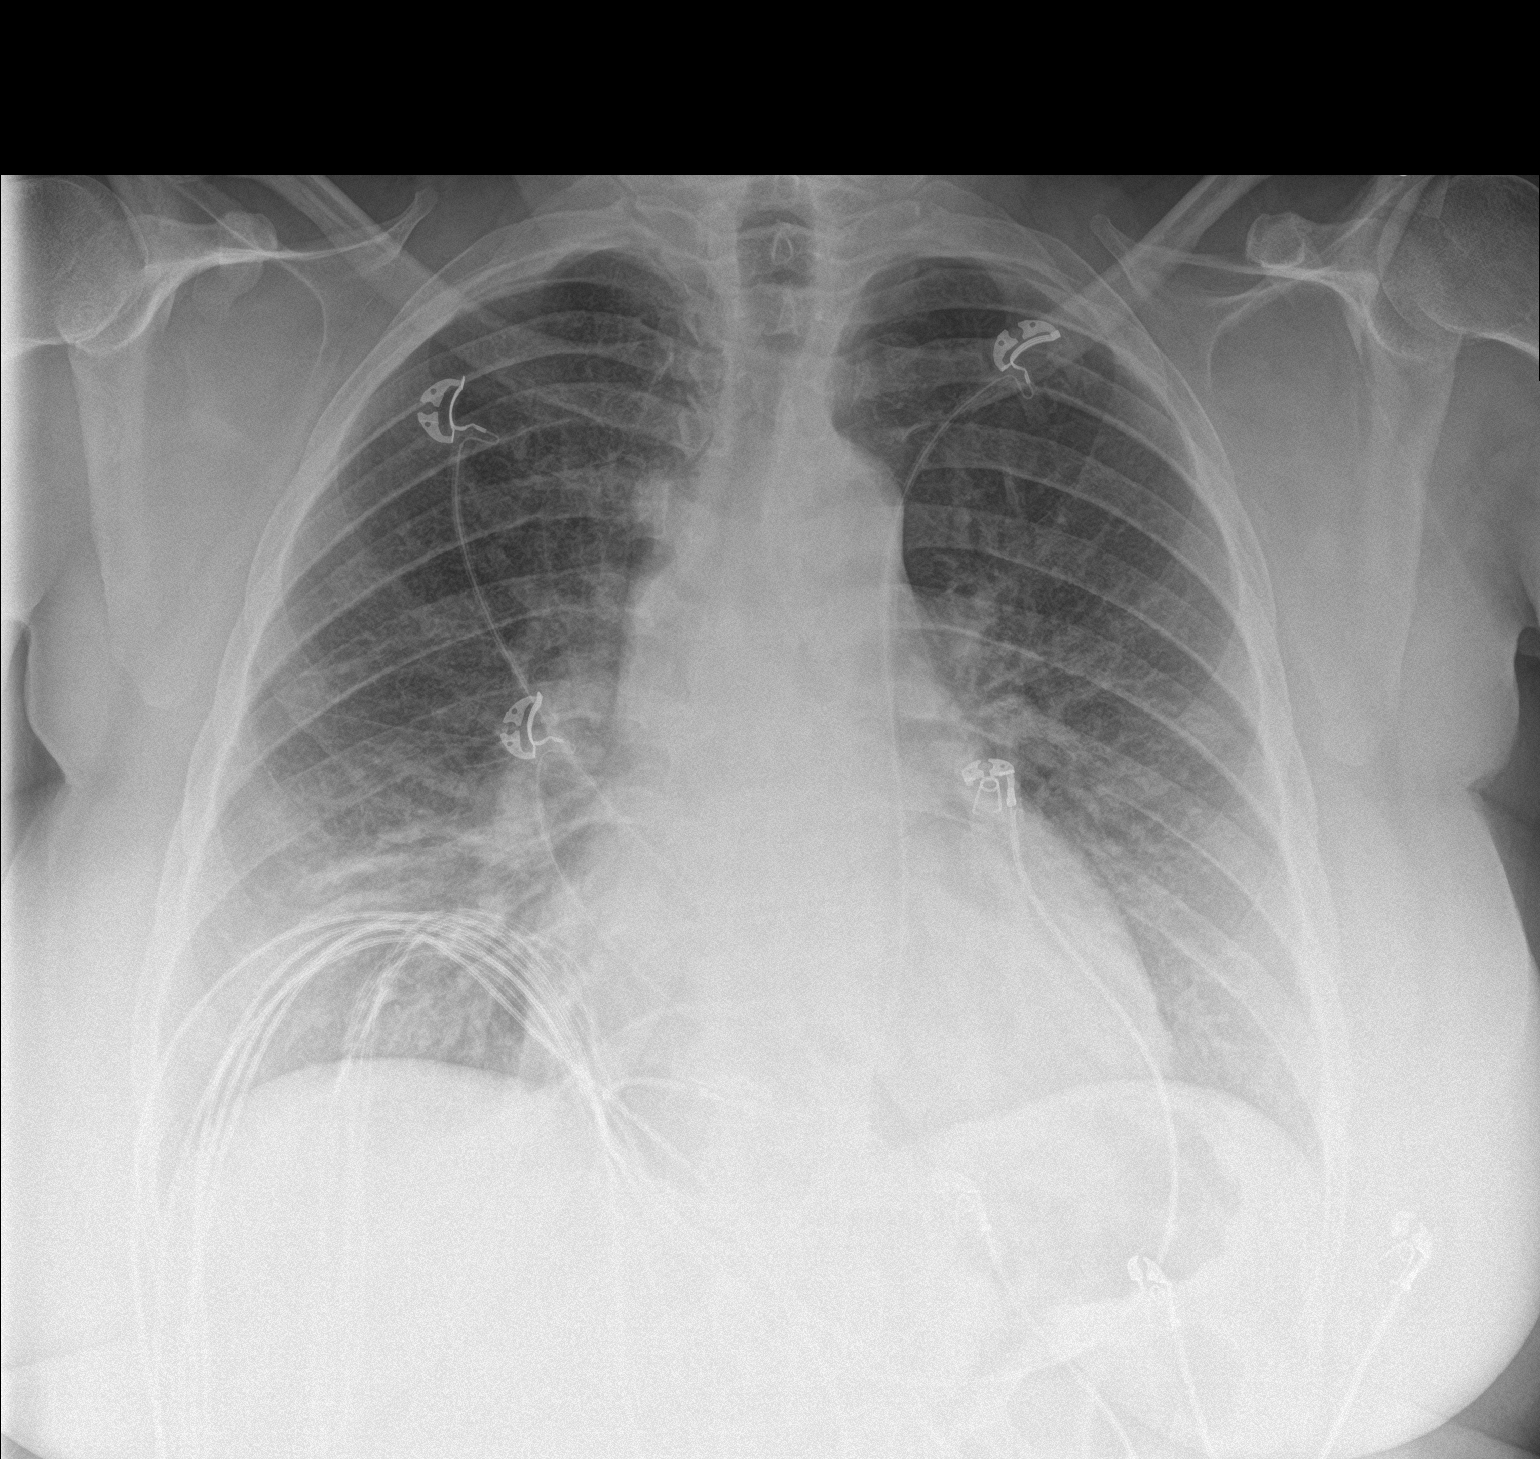

[chest lat]
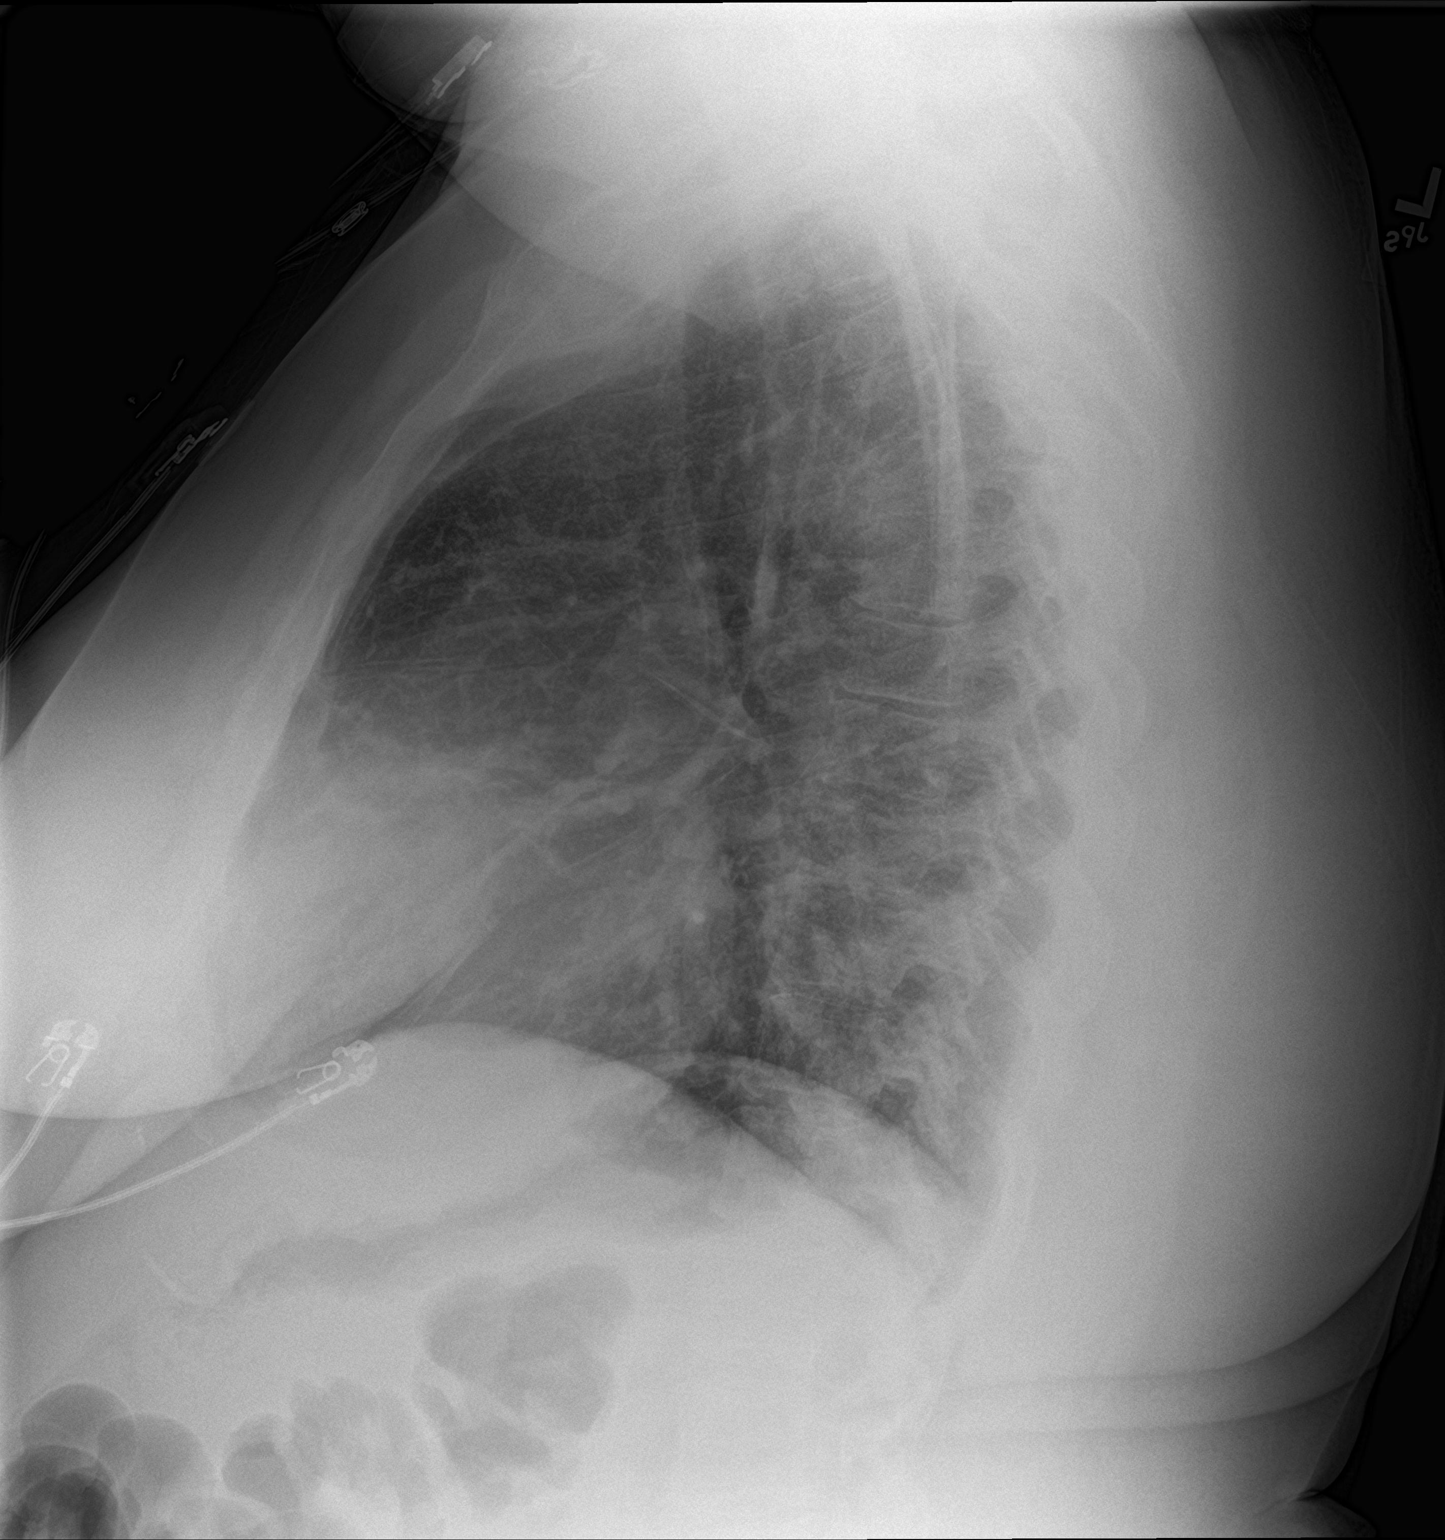

[2 of 2 positions shown; findings below may reference images not displayed]

FINDINGS: Stable mild cardiomegaly. Normal pulmonary vascularity. Coarsened
interstitial markings are similar to prior study and may be
smoking-related. Increased patchy opacity in the right lower lobe.
No pleural effusion or pneumothorax. No acute osseous abnormality.
IMPRESSION: Patchy density in the right lower lobe, suspicious for pneumonia
given clinical history.

## 2017-11-07 MED ORDER — ENOXAPARIN SODIUM 40 MG/0.4ML ~~LOC~~ SOLN
40.0000 mg | SUBCUTANEOUS | Status: DC
  Administered 2017-11-07 – 2017-11-08 (×2): 40 mg via SUBCUTANEOUS
  Filled 2017-11-07 (×2): qty 0.4

## 2017-11-07 MED ORDER — DULOXETINE HCL 60 MG PO CPEP
60.0000 mg | ORAL_CAPSULE | Freq: Two times a day (BID) | ORAL | Status: DC
  Administered 2017-11-07 – 2017-11-09 (×4): 60 mg via ORAL
  Filled 2017-11-07 (×4): qty 1

## 2017-11-07 MED ORDER — CLINDAMYCIN PHOSPHATE 1 % EX GEL: Freq: Two times a day (BID) | CUTANEOUS | Status: DC

## 2017-11-07 MED ORDER — FLUTICASONE PROPIONATE 50 MCG/ACT NA SUSP
2.0000 | Freq: Every day | NASAL | Status: DC
  Administered 2017-11-07 – 2017-11-09 (×3): 2 via NASAL
  Filled 2017-11-07: qty 16

## 2017-11-07 MED ORDER — ALBUTEROL SULFATE (2.5 MG/3ML) 0.083% IN NEBU
2.5000 mg | INHALATION_SOLUTION | RESPIRATORY_TRACT | Status: DC | PRN
  Administered 2017-11-07: 2.5 mg via RESPIRATORY_TRACT
  Filled 2017-11-07: qty 3

## 2017-11-07 MED ORDER — ALBUTEROL SULFATE (2.5 MG/3ML) 0.083% IN NEBU
5.0000 mg | INHALATION_SOLUTION | Freq: Once | RESPIRATORY_TRACT | Status: AC
  Administered 2017-11-07: 5 mg via RESPIRATORY_TRACT
  Filled 2017-11-07: qty 6

## 2017-11-07 MED ORDER — PREGABALIN 100 MG PO CAPS
100.0000 mg | ORAL_CAPSULE | Freq: Three times a day (TID) | ORAL | Status: DC
  Administered 2017-11-07 – 2017-11-09 (×6): 100 mg via ORAL
  Filled 2017-11-07 (×6): qty 1

## 2017-11-07 MED ORDER — CLONAZEPAM 0.5 MG PO TABS
0.5000 mg | ORAL_TABLET | Freq: Two times a day (BID) | ORAL | Status: DC
  Administered 2017-11-07 – 2017-11-09 (×4): 0.5 mg via ORAL
  Filled 2017-11-07 (×4): qty 1

## 2017-11-07 MED ORDER — ONDANSETRON HCL 4 MG/2ML IJ SOLN: 4.0000 mg | Freq: Once | INTRAMUSCULAR | Status: DC

## 2017-11-07 MED ORDER — DOXYCYCLINE HYCLATE 100 MG PO TABS
100.0000 mg | ORAL_TABLET | Freq: Once | ORAL | Status: AC
  Administered 2017-11-07: 100 mg via ORAL
  Filled 2017-11-07: qty 1

## 2017-11-07 MED ORDER — ACETAMINOPHEN 325 MG PO TABS
650.0000 mg | ORAL_TABLET | Freq: Four times a day (QID) | ORAL | Status: DC | PRN
  Administered 2017-11-07 – 2017-11-09 (×3): 650 mg via ORAL
  Filled 2017-11-07 (×3): qty 2

## 2017-11-07 MED ORDER — ACETAMINOPHEN 325 MG PO TABS
650.0000 mg | ORAL_TABLET | Freq: Once | ORAL | Status: AC
  Administered 2017-11-07: 650 mg via ORAL
  Filled 2017-11-07: qty 2

## 2017-11-07 MED ORDER — GUAIFENESIN ER 600 MG PO TB12
600.0000 mg | ORAL_TABLET | Freq: Two times a day (BID) | ORAL | Status: DC | PRN
  Administered 2017-11-07 – 2017-11-09 (×3): 600 mg via ORAL
  Filled 2017-11-07 (×3): qty 1

## 2017-11-07 MED ORDER — CLORAZEPATE DIPOTASSIUM 7.5 MG PO TABS: 15.0000 mg | ORAL_TABLET | Freq: Every day | ORAL | Status: DC

## 2017-11-07 MED ORDER — HYDROCHLOROTHIAZIDE 25 MG PO TABS
25.0000 mg | ORAL_TABLET | Freq: Every day | ORAL | Status: DC
  Administered 2017-11-08 – 2017-11-09 (×2): 25 mg via ORAL
  Filled 2017-11-07 (×2): qty 1

## 2017-11-07 MED ORDER — PROMETHAZINE HCL 25 MG PO TABS
12.5000 mg | ORAL_TABLET | Freq: Four times a day (QID) | ORAL | Status: DC | PRN
  Administered 2017-11-08: 12.5 mg via ORAL
  Filled 2017-11-07: qty 1

## 2017-11-07 MED ORDER — PANTOPRAZOLE SODIUM 40 MG PO TBEC
40.0000 mg | DELAYED_RELEASE_TABLET | Freq: Every day | ORAL | Status: DC
  Administered 2017-11-08 – 2017-11-09 (×2): 40 mg via ORAL
  Filled 2017-11-07 (×2): qty 1

## 2017-11-07 MED ORDER — BREXPIPRAZOLE 1 MG PO TABS
2.0000 | ORAL_TABLET | Freq: Every day | ORAL | Status: DC
  Administered 2017-11-08 – 2017-11-09 (×2): 2 mg via ORAL
  Filled 2017-11-07 (×2): qty 2

## 2017-11-07 MED ORDER — ACETAMINOPHEN 650 MG RE SUPP: 650.0000 mg | Freq: Four times a day (QID) | RECTAL | Status: DC | PRN

## 2017-11-07 MED ORDER — ONDANSETRON 4 MG PO TBDP
4.0000 mg | ORAL_TABLET | Freq: Once | ORAL | Status: AC
  Administered 2017-11-07: 4 mg via ORAL
  Filled 2017-11-07: qty 1

## 2017-11-07 MED ORDER — FUROSEMIDE 10 MG/ML IJ SOLN
20.0000 mg | Freq: Once | INTRAMUSCULAR | Status: AC
  Administered 2017-11-07: 20 mg via INTRAVENOUS
  Filled 2017-11-07: qty 2

## 2017-11-07 MED ORDER — POLYETHYLENE GLYCOL 3350 17 G PO PACK
17.0000 g | PACK | Freq: Every day | ORAL | Status: DC
  Administered 2017-11-09: 17 g via ORAL
  Filled 2017-11-07 (×2): qty 1

## 2017-11-07 MED ORDER — HYDROXYZINE PAMOATE 50 MG PO CAPS
50.0000 mg | ORAL_CAPSULE | Freq: Every day | ORAL | Status: DC | PRN
  Filled 2017-11-07: qty 1

## 2017-11-07 NOTE — ED Triage Notes (Signed)
Pt presents for sob with productive cough x 3-4 days. Reports fevers at home. States some runny nose and congestion. Denies cp.

## 2017-11-07 NOTE — Telephone Encounter (Signed)
Pt needs to speak with the nurse about getting promethazine (PHENERGAN) 25 MG tablet. Please call pt back.

## 2017-11-07 NOTE — ED Notes (Signed)
Reposition pulse ox to the ear, with results 100%, per RN O2 decreased to 2L

## 2017-11-07 NOTE — ED Notes (Signed)
Admitting at bedside 

## 2017-11-07 NOTE — Telephone Encounter (Signed)
Pt presented to lobby, c/o short of breath and "not feeling good" very hard tight cough, no production. Taken vis wh/ch to ED for eval.

## 2017-11-07 NOTE — H&P (Addendum)
Date: 11/07/2017               Patient Name:  Lindsay [name] MRN: [id number]  DOB: 1970-08-13 Age / Sex: 47 y.o., female   PCP: Velna Ochs, MD         Medical Service: Internal Medicine Teaching Service         Attending Physician: Dr. Lucious Groves, DO    First Contact: Dr. Myrtie Hawk Pager: [phone]  Second Contact: Dr. Tarri Abernethy Pager: [phone]       After Hours (After 5p/  First Contact Pager: [phone]  weekends / holidays): Second Contact Pager: [phone]   Chief Complaint: Cough  History of Present Illness: Ms. Adryanna Friedt is a 47 y.o female with IBS, fibromyalgia, HTN, and obesity who presented to the ED with 3-4 days of productive cough, rhinorrhea, and subjective fevers.  She endorses exertional dyspnea since 9/26 when she presented to behavioral health.   Endorses orthopnea but states that she feels it is more related to her secretions strangling her. Endorses oral ulcers, worsening of her hidrandenitis suppurativa, pleuritic chest pain, exertional dyspnea, lightheadedness, abdominal pain, N/V, alternating constipation-diarrhea, no blood in her stool, polyuria but has resolved, no dysuria. Reports some runny nose and congestion at her nose and throat but no sore throat. She reports family member with upper respiratory symptoms and some mild gi symptoms. Denies any traveling.  No hx of asthma or COPD, but use to be on inhalers about 20 years ago.   -She noted to be tachypneic with O2sat of 90% at room air when arrived to ED.  Meds:  Current Meds  Medication Sig  . Brexpiprazole (REXULTI) 1 MG TABS Take 2 tablets (2 mg total) by mouth daily.  . cholecalciferol (VITAMIN D) 1000 units tablet Take 1 tablet (1,000 Units total) by mouth daily.  . clorazepate (TRANXENE) 15 MG tablet TAKE 1 TABLET BY MOUTH EVERY DAY IN THE MORNING  . Dextromethorphan-guaiFENesin (TUSSIN DM) 10-100 MG/5ML liquid Take 10 mLs by mouth as needed (cough).  . DULoxetine (CYMBALTA) 60 MG  capsule TAKE 1 CAPSULE (60 MG TOTAL) BY MOUTH 2 (TWO) TIMES DAILY.  . fluticasone (FLONASE) 50 MCG/ACT nasal spray Place 2 sprays into both nostrils daily.  [name] guaiFENesin (MUCINEX) 600 MG 12 hr tablet Take 600 mg by mouth 2 (two) times daily as needed for cough or to loosen phlegm.  . hydrOXYzine (VISTARIL) 50 MG capsule Take one capsule daily as needed for anxiety  . LYRICA 100 MG capsule TAKE 1 CAPSULE BY MOUTH THREE TIMES A DAY (Patient taking differently: Take 100 mg by mouth 3 (three) times daily. )  . Multiple Vitamin (MULTIVITAMIN WITH MINERALS) TABS tablet Take 1 tablet by mouth daily.  [name] omeprazole (PRILOSEC) 40 MG capsule Take 1 capsule (40 mg total) by mouth daily.  . vitamin B-12 (CYANOCOBALAMIN) 1000 MCG tablet Take 1 tablet (1,000 mcg total) by mouth daily.  . vitamin C (ASCORBIC ACID) 500 MG tablet Take 500 mg by mouth daily.     Allergies: Allergies as of 11/07/2017 - Review Complete 11/07/2017  Allergen Reaction Noted  . Lisinopril Cough 11/06/2015   Past Medical History:  Diagnosis Date  . Acid reflux   . Anemia    Iron Def  . Anorexia   . Chronic kidney disease    Nephrotic syndrome  . Colon polyp 2009  . Depression with anxiety   . Edema leg   . Fibromyalgia   . Hemorrhoids   . Hidradenitis  suppurativa   . Hypertension   . IBS (irritable bowel syndrome)   . Low back pain   . Migraines   . Morbidly obese ([place])   . Neuromuscular disorder (HCC)    fibromyalgia  . Neuropathy   . Panic attacks   . Polyp of vocal cord or larynx   . Tonsil pain     Family History: Heart failure in mother, DM in mother, DM in daughter, Prostate cancer in father                             Social History: Smokes 5 cigarettes/day                           Denies any illicit drug use                           Denies alcohol use  Review of Systems: A complete ROS was negative except as per HPI.   Physical Exam: Blood pressure (!) 154/82, pulse 96, temperature 98.7 F  (37.1 C), temperature source Oral, resp. rate 18, last menstrual period 11/07/2017, SpO2 93 %.  Physical Exam  Constitutional: She is morbidly obese. Alert and oriented to person, place, and time. She appears well-developed and well-nourished.  HENT:  Head: Normocephalic and atraumatic.  Cardiovascular: Normal rate, regular rhythm and normal heart sounds.  No murmur heard. Pulmonary/Chest: Effort normal. No respiratory distress. She has no decreased breath sounds. She has wheezes in the right lower field and the left lower field. Some fine crackle at bases. Abdominal: Soft. There is tenderness.  Musculoskeletal:       Left lower leg: She exhibits no tenderness and no edema.  Neurological: She is alert and oriented to person, place, and time.    Small mildly tender hidradenitis at right axillary area. Trace drainage  EKG: personally reviewed my interpretation is occasional PVC, poor progression  CXR: personally reviewed my interpretation is bilateral increased interstitial marking at lower fields  Assessment & Plan by Problem: Active Problems:   Community acquired pneumonia  Fever, Cough, SOB and hypoxia: Hypoxia resolved nasal O2 , normal saturation at room air. no leukocytosis.  Chest x-ray shows some increased interstitial marking not highly consistent with pneumonia. Calcitonin negative.  Also reports similar but shorter course of symptoms in family members. Likely secondary to viral upper respiratory infection.  Patient some history of asthma 20 years ago but not on any inhalor for years. Also with Hx of HTN and grade 1 DD with EF: 55-60% (Echo 05/19/2017), recent HX of DOE can be in seeting of worsening of CHF.  CBC Latest Ref Rng & Units 11/07/2017 10/09/2015 10/20/2014  WBC 4.0 - 10.5 K/uL 9.4 9.1 -  Hemoglobin 12.0 - 15.0 g/dL 11.8(L) 13.5 -  Hematocrit 36.0 - 46.0 % 39.5 42.2 39.8  Platelets 150 - 400 K/uL 387 375 -  -Respiratory viral panel PCR -Albuterol nebulizer -BNP -20  mg IV lasix once -Flonase 2 spray daily -Mucinex 600 mg 2 times daily as needed   HTN: -Continue home dose of HCTZ 25 mg daily -BMP daily  Depression: Stable -Continue home dose of duloxetine, clonazepam  Hidradenitis: Small lesion at right axillary area with trace dried drainage. -Consider treating with doxycycline if not improved in next days   Diet: 2 gram salt diet Code: Full VTE: Levonox IV fluid: None  Dispo:  Admit patient to Inpatient with expected length of stay greater than 2 midnights.  SignedDewayne Hatch, MD 11/07/2017, 7:35 PM  Pager: [phone]

## 2017-11-07 NOTE — ED Notes (Signed)
IV access attempted without success.

## 2017-11-07 NOTE — ED Provider Notes (Signed)
[facility] EMERGENCY DEPARTMENT Provider Note   CSN: [id number] Arrival date & time: 11/07/17  1138     History   Chief Complaint Chief Complaint  Patient presents with  . Shortness of Breath    HPI Lindsay [name] is a 47 y.o. female with past medical history of iron deficiency anemia, CKD, hypertension, IBS, fibromyalgia, presenting to emergency department with 4 days of gradually worsening shortness of breath with productive cough of brown sputum.  She states she also has associated fever of 101.4 which she treated with Tylenol at home yesterday.  Feels it is difficult to catch her breath and her chest is tight.  She has associated nasal congestion and rhinorrhea.  Denies chest pain, hemoptysis, sore throat.  Also has complaint of patient x2 days, though without significant abdominal pain.  The history is provided by the patient.    Past Medical History:  Diagnosis Date  . Acid reflux   . Anemia    Iron Def  . Anorexia   . Chronic kidney disease    Nephrotic syndrome  . Colon polyp 2009  . Depression with anxiety   . Edema leg   . Fibromyalgia   . Hemorrhoids   . Hidradenitis suppurativa   . Hypertension   . IBS (irritable bowel syndrome)   . Low back pain   . Migraines   . Morbidly obese ([place])   . Neuromuscular disorder (HCC)    fibromyalgia  . Neuropathy   . Panic attacks   . Polyp of vocal cord or larynx   . Tonsil pain     Patient Active Problem List   Diagnosis Date Noted  . Fall 09/01/2017  . Low serum vitamin B12 05/02/2017  . Palpitations 04/11/2017  . Cough 03/25/2017  . 'light-for-dates' infant with signs of fetal malnutrition 03/06/2017  . Acute pain of right knee 09/27/2016  . Iron deficiency 06/17/2016  . Chest wall pain 06/12/2016  . Tooth fracture 06/12/2016  . Allergic rhinitis 06/12/2016  . Vitamin D deficiency, unspecified 06/12/2016  . Nicotine dependence 06/12/2016  . Menorrhagia 01/08/2016  . Migraines  01/08/2016  . Bilateral leg pain 11/29/2015  . Bilateral pes planus 11/06/2015  . Hemorrhoids 07/05/2015  . Fibromyalgia 07/05/2015  . Altered bowel habits 04/06/2015  . Chronic leg pain 09/26/2014  . Healthcare maintenance 09/26/2014  . Morbid obesity ([place]) 07/04/2014  . Major depressive disorder, recurrent episode, moderate ([place]) 10/13/2012  . Generalized anxiety disorder 10/13/2012  . Vaginal yeast infection 11/27/2010  . Chronic nausea 05/10/2010  . IBS (irritable bowel syndrome) 06/16/2007  . Essential hypertension 02/06/2006  . HIDRADENITIS 11/18/2005    Past Surgical History:  Procedure Laterality Date  . AXILLARY HIDRADENITIS EXCISION    . COLONOSCOPY WITH PROPOFOL N/A 05/25/2015   Procedure: COLONOSCOPY WITH PROPOFOL;  Surgeon: Milus Banister, MD;  Location: [facility];  Service: Endoscopy;  Laterality: N/A;  . HEMORRHOID SURGERY     with Hidradenitis surgery   . INGUINAL HIDRADENITIS EXCISION    . TONSILLECTOMY  10/18/2010   by Dr. Wilburn Cornelia  . UPPER GASTROINTESTINAL ENDOSCOPY       OB History    Gravida  2   Para  1   Term      Preterm      AB  1   Living        SAB      TAB  1   Ectopic      Multiple      Live  Births               Home Medications    Prior to Admission medications   Medication Sig Start Date End Date Taking? Authorizing Provider  Brexpiprazole (REXULTI) 1 MG TABS Take 2 tablets (2 mg total) by mouth daily. 10/23/17  Yes Charlcie Cradle, MD  cholecalciferol (VITAMIN D) 1000 units tablet Take 1 tablet (1,000 Units total) by mouth daily. 05/06/17  Yes Velna Ochs, MD  clorazepate (TRANXENE) 15 MG tablet TAKE 1 TABLET BY MOUTH EVERY DAY IN THE MORNING 10/23/17  Yes Charlcie Cradle, MD  Dextromethorphan-guaiFENesin (TUSSIN DM) 10-100 MG/5ML liquid Take 10 mLs by mouth as needed (cough).   Yes [provider]  DULoxetine (CYMBALTA) 60 MG capsule TAKE 1 CAPSULE (60 MG TOTAL) BY MOUTH 2 (TWO) TIMES DAILY. 04/02/17   Yes Kathrynn Ducking, MD  fluticasone [facility]) 50 MCG/ACT nasal spray Place 2 sprays into both nostrils daily. 03/07/17 03/07/18 Yes Velna Ochs, MD  guaiFENesin (MUCINEX) 600 MG 12 hr tablet Take 600 mg by mouth 2 (two) times daily as needed for cough or to loosen phlegm.   Yes [provider]  hydrOXYzine (VISTARIL) 50 MG capsule Take one capsule daily as needed for anxiety 10/23/17  Yes Charlcie Cradle, MD  LYRICA 100 MG capsule TAKE 1 CAPSULE BY MOUTH THREE TIMES A DAY Patient taking differently: Take 100 mg by mouth 3 (three) times daily.  08/26/17  Yes Velna Ochs, MD  Multiple Vitamin (MULTIVITAMIN WITH MINERALS) TABS tablet Take 1 tablet by mouth daily.   Yes [provider]  omeprazole (PRILOSEC) 40 MG capsule Take 1 capsule (40 mg total) by mouth daily. 05/02/17  Yes Velna Ochs, MD  vitamin B-12 (CYANOCOBALAMIN) 1000 MCG tablet Take 1 tablet (1,000 mcg total) by mouth daily. 05/06/17  Yes Velna Ochs, MD  vitamin C (ASCORBIC ACID) 500 MG tablet Take 500 mg by mouth daily.   Yes [provider]  benzonatate (TESSALON PERLES) 100 MG capsule Take 1 capsule (100 mg total) by mouth 3 (three) times daily as needed for cough. Patient not taking: Reported on 10/23/2017 03/26/17 03/26/18  Welford Roche, MD  clindamycin (CLINDAGEL) 1 % gel Apply topically 2 (two) times daily. Apply to left axilla. DIAG CODE L73.2 Patient not taking: Reported on 11/07/2017 07/11/17   Velna Ochs, MD  cyclobenzaprine (FLEXERIL) 5 MG tablet Take 1 tablet (5 mg total) by mouth 3 (three) times daily as needed for muscle spasms. Patient not taking: Reported on 10/23/2017 09/01/17   Velna Ochs, MD  ferrous sulfate 325 (65 FE) MG tablet Take 1 tablet (325 mg total) by mouth daily. Patient not taking: Reported on 11/07/2017 05/06/17 05/06/18  Velna Ochs, MD  hydrochlorothiazide (HYDRODIURIL) 25 MG tablet TAKE 1 TABLET BY MOUTH EVERY DAY Patient not taking:  Reported on 11/07/2017 08/08/17   Velna Ochs, MD  promethazine (PHENERGAN) 25 MG tablet Take 1 tablet (25 mg total) by mouth 2 (two) times daily as needed for nausea or vomiting. Patient not taking: Reported on 11/07/2017 09/01/17   Velna Ochs, MD  Vitamin D, Ergocalciferol, (DRISDOL) 50000 units CAPS capsule Take 1 capsule (50,000 Units total) by mouth every 7 (seven) days. Patient not taking: Reported on 11/07/2017 06/13/16   Velna Ochs, MD    Family History Family History  Problem Relation Age of Onset  . Diabetes Mother   . Heart disease Mother        valve leak  . Anxiety disorder Mother   . Depression  Mother   . High blood pressure Mother   . Kidney failure Mother   . Cancer Father        prostate  . Heart disease Father   . Learning disabilities Sister   . Depression Sister   . Depression Sister   . Anxiety disorder Sister   . Colon cancer Neg Hx     Social History Social History   Tobacco Use  . Smoking status: Current Some Day Smoker    Packs/day: 0.50    Years: 25.00    Pack years: 12.50    Types: Cigarettes  . Smokeless tobacco: Never Used  . Tobacco comment: 4-5 cigs per day trying to quit  Substance Use Topics  . Alcohol use: No    Alcohol/week: 0.0 standard drinks    Comment: occassionally  . Drug use: No     Allergies   Lisinopril   Review of Systems Review of Systems  Constitutional: Positive for fever.  Respiratory: Positive for cough, chest tightness and shortness of breath.   Cardiovascular: Negative for chest pain.  Gastrointestinal: Positive for constipation and nausea. Negative for abdominal pain.  All other systems reviewed and are negative.    Physical Exam Updated Vital Signs BP (!) 139/96   Pulse 93   Temp 98.9 F (37.2 C)   Resp 19   LMP 11/07/2017   SpO2 100%   Physical Exam  Constitutional: She appears well-developed and well-nourished.  Morbidly obese  HENT:  Head: Normocephalic and atraumatic.    Eyes: Conjunctivae are normal.  Neck: Normal range of motion.  Cardiovascular: Normal rate, regular rhythm and intact distal pulses.  Pulmonary/Chest: Effort normal.  Increased work of breathing though not in distress.  Lung sounds are rhonchorous with wheezing bilaterally.  Abdominal: Soft. Bowel sounds are normal. There is no tenderness. There is no guarding.  Neurological: She is alert.  Skin: Skin is warm.  Psychiatric: She has a normal mood and affect. Her behavior is normal.  Nursing note and vitals reviewed.    ED Treatments / Results  Labs (all labs ordered are listed, but only abnormal results are displayed) Labs Reviewed  BASIC METABOLIC PANEL - Abnormal; Notable for the following components:      Result Value   Calcium 8.4 (*)    All other components within normal limits  CBC - Abnormal; Notable for the following components:   Hemoglobin 11.8 (*)    MCV 79.0 (*)    MCH 23.6 (*)    MCHC 29.9 (*)    RDW 19.4 (*)    All other components within normal limits  I-STAT TROPONIN, ED  I-STAT BETA HCG BLOOD, ED ([facility], [facility], [facility] ONLY)    EKG EKG Interpretation  Date/Time:  Friday November 07 2017 11:41:28 EDT Ventricular Rate:  97 PR Interval:  146 QRS Duration: 88 QT Interval:  368 QTC Calculation: 467 R Axis:   75 Text Interpretation:  Sinus rhythm with occasional Premature ventricular complexes Septal infarct , age undetermined Abnormal ECG Confirmed by Virgel Manifold [phone]) on 11/07/2017 11:53:41 AM Also confirmed by Virgel Manifold [phone]), editor Hattie Perch [phone])  on 11/07/2017 1:58:43 PM   Radiology Dg Chest 2 View  Result Date: 11/07/2017 CLINICAL DATA:  Shortness of breath and productive cough for the past 3-4 days. Fever. EXAM: CHEST - 2 VIEW COMPARISON:  Chest x-ray dated March 25, 2017. FINDINGS: Stable mild cardiomegaly. Normal pulmonary vascularity. Coarsened interstitial markings are similar to prior study and may be smoking-related. Increased  patchy opacity in the right lower lobe. No pleural effusion or pneumothorax. No acute osseous abnormality. IMPRESSION: Patchy density in the right lower lobe, suspicious for pneumonia given clinical history. Electronically Signed   By: Titus Dubin M.D.   On: 11/07/2017 12:54    Procedures Procedures (including critical care time)  Medications Ordered in ED Medications  albuterol (PROVENTIL) (2.5 MG/3ML) 0.083% nebulizer solution 5 mg (has no administration in time range)  albuterol (PROVENTIL) (2.5 MG/3ML) 0.083% nebulizer solution 5 mg (5 mg Nebulization Given 11/07/17 1329)  acetaminophen (TYLENOL) tablet 650 mg (650 mg Oral Given 11/07/17 1353)  doxycycline (VIBRA-TABS) tablet 100 mg (100 mg Oral Given 11/07/17 1354)  ondansetron (ZOFRAN-ODT) disintegrating tablet 4 mg (4 mg Oral Given 11/07/17 1353)     Initial Impression / Assessment and Plan / ED Course  I have reviewed the triage vital signs and the nursing notes.  Pertinent labs & imaging results that were available during my care of the patient were reviewed by me and considered in my medical decision making (see chart for details).  Clinical Course as of Nov 07 1556  Fri Nov 07, 2017  1553 IM teaching service accepting admission.   [JR]    Clinical Course User Index [JR] Robinson, [place] N, PA-C    Patient presenting with shortness of breath, productive cough, fever and fatigue 4 days.  On arrival to the ED her O2 saturation is 90% on room air and she is tachypneic.  She has some increased work of breathing though is not in distress.  Lungs with wheezes and rhonchi bilaterally with some decreased movement.  Afebrile. Chest x-ray confirming right lower lobe pneumonia. She is also complaining of 2 days of constipation though abdomen is benign on exam.    Leukocytosis.  BMP unremarkable.  Troponin negative.  Patient given antibiotics and nebulizer treatment.  Albuterol improved symptoms with some improvement in lung sounds  though patient remains tachypneic with lowest oxygen saturation at 86%.  Requiring 2 L nasal cannula.  Recommend admission for community-acquired pneumonia with new oxygen requirement.  Internal medicine teaching service accepting admission.  Discussed with Dr. Lajuan Lines, who agrees with care plan for admission.  The patient appears reasonably stabilized for admission considering the current resources, flow, and capabilities available in the ED at this time, and I doubt any other [facility] requiring further screening and/or treatment in the ED prior to admission.,  Final Clinical Impressions(s) / ED Diagnoses   Final diagnoses:  Community acquired pneumonia of right lower lobe of lung [facility])    ED Discharge Orders    None       Robinson, [place] N, PA-C 11/07/17 1558    Virgel Manifold, MD 11/07/17 2315

## 2017-11-07 NOTE — Telephone Encounter (Signed)
Made appt for mon, may change til today if can find transportation

## 2017-11-07 NOTE — ED Notes (Signed)
Pt noted to desat to 70% with good pleth while asleep. Pt placed on 6L [place] and ED PA notified. Will cont to monitor.

## 2017-11-08 DIAGNOSIS — M797 Fibromyalgia: Secondary | ICD-10-CM

## 2017-11-08 DIAGNOSIS — Z79899 Other long term (current) drug therapy: Secondary | ICD-10-CM

## 2017-11-08 DIAGNOSIS — K589 Irritable bowel syndrome without diarrhea: Secondary | ICD-10-CM

## 2017-11-08 DIAGNOSIS — M542 Cervicalgia: Secondary | ICD-10-CM

## 2017-11-08 DIAGNOSIS — R079 Chest pain, unspecified: Secondary | ICD-10-CM

## 2017-11-08 DIAGNOSIS — R0902 Hypoxemia: Secondary | ICD-10-CM

## 2017-11-08 DIAGNOSIS — F339 Major depressive disorder, recurrent, unspecified: Secondary | ICD-10-CM

## 2017-11-08 DIAGNOSIS — L732 Hidradenitis suppurativa: Secondary | ICD-10-CM

## 2017-11-08 DIAGNOSIS — J9611 Chronic respiratory failure with hypoxia: Secondary | ICD-10-CM

## 2017-11-08 DIAGNOSIS — I509 Heart failure, unspecified: Secondary | ICD-10-CM

## 2017-11-08 DIAGNOSIS — J206 Acute bronchitis due to rhinovirus: Principal | ICD-10-CM

## 2017-11-08 DIAGNOSIS — E669 Obesity, unspecified: Secondary | ICD-10-CM

## 2017-11-08 DIAGNOSIS — I110 Hypertensive heart disease with heart failure: Secondary | ICD-10-CM

## 2017-11-08 DIAGNOSIS — M25512 Pain in left shoulder: Secondary | ICD-10-CM

## 2017-11-08 DIAGNOSIS — M25511 Pain in right shoulder: Secondary | ICD-10-CM

## 2017-11-08 LAB — CBC
HCT: 39.7 % (ref 36.0–46.0)
Hemoglobin: 11.4 g/dL — ABNORMAL LOW (ref 12.0–15.0)
MCH: 23.1 pg — ABNORMAL LOW (ref 26.0–34.0)
MCHC: 28.7 g/dL — ABNORMAL LOW (ref 30.0–36.0)
MCV: 80.4 fL (ref 80.0–100.0)
Platelets: 345 10*3/uL (ref 150–400)
RBC: 4.94 MIL/uL (ref 3.87–5.11)
RDW: 19.7 % — ABNORMAL HIGH (ref 11.5–15.5)
WBC: 8.8 10*3/uL (ref 4.0–10.5)
nRBC: 0 % (ref 0.0–0.2)

## 2017-11-08 LAB — BASIC METABOLIC PANEL
Anion gap: 7 (ref 5–15)
BUN: <5 mg/dL — ABNORMAL LOW (ref 6–20)
CO2: 27 mmol/L (ref 22–32)
Calcium: 8.2 mg/dL — ABNORMAL LOW (ref 8.9–10.3)
Chloride: 104 mmol/L (ref 98–111)
Creatinine, Ser: 0.77 mg/dL (ref 0.44–1.00)
GFR calc Af Amer: >60 mL/min (ref >60)
GFR calc non Af Amer: >60 mL/min (ref >60)
Glucose, Bld: 98 mg/dL (ref 70–99)
Potassium: 3.6 mmol/L (ref 3.5–5.1)
Sodium: 138 mmol/L (ref 135–145)

## 2017-11-08 LAB — HIV ANTIBODY (ROUTINE TESTING W REFLEX): HIV Screen 4th Generation wRfx: NONREACTIVE

## 2017-11-08 MED ORDER — FUROSEMIDE 10 MG/ML IJ SOLN
20.0000 mg | Freq: Once | INTRAMUSCULAR | Status: AC
  Administered 2017-11-08: 20 mg via INTRAVENOUS
  Filled 2017-11-08: qty 2

## 2017-11-08 MED ORDER — KETOROLAC TROMETHAMINE 15 MG/ML IJ SOLN: 15.0000 mg | Freq: Once | INTRAMUSCULAR | Status: DC | PRN

## 2017-11-08 MED ORDER — KETOROLAC TROMETHAMINE 15 MG/ML IJ SOLN
7.5000 mg | Freq: Once | INTRAMUSCULAR | Status: AC
  Administered 2017-11-08: 7.5 mg via INTRAVENOUS
  Filled 2017-11-08: qty 1

## 2017-11-08 MED ORDER — HYDROXYZINE HCL 25 MG PO TABS
50.0000 mg | ORAL_TABLET | Freq: Every day | ORAL | Status: DC | PRN
  Administered 2017-11-08: 50 mg via ORAL
  Filled 2017-11-08: qty 2

## 2017-11-08 MED ORDER — KETOROLAC TROMETHAMINE 15 MG/ML IJ SOLN
7.5000 mg | Freq: Once | INTRAMUSCULAR | Status: AC | PRN
  Administered 2017-11-08: 7.5 mg via INTRAVENOUS
  Filled 2017-11-08: qty 1

## 2017-11-08 MED ORDER — BENZONATATE 100 MG PO CAPS
100.0000 mg | ORAL_CAPSULE | Freq: Three times a day (TID) | ORAL | Status: DC | PRN
  Administered 2017-11-08 – 2017-11-09 (×4): 100 mg via ORAL
  Filled 2017-11-08 (×3): qty 1

## 2017-11-08 MED ORDER — KETOROLAC TROMETHAMINE 10 MG PO TABS
10.0000 mg | ORAL_TABLET | Freq: Four times a day (QID) | ORAL | Status: DC | PRN
  Administered 2017-11-09: 10 mg via ORAL
  Filled 2017-11-08 (×3): qty 1

## 2017-11-08 NOTE — Progress Notes (Signed)
   Subjective: Patient was seen and evaluated at bedside on morning rounds. She complained of some muscle pain at her shoulders and neck. Still has some cough.  Reports some chest  Pain when cough. SOB is improved with nasal O2.  Objective:  Vital signs in last 24 hours: Vitals:   11/07/17 2358 11/08/17 0724 11/08/17 0823 11/08/17 1058  BP: (!) 165/112  (!) 170/116 (!) 156/106  Pulse: (!) 120 (!) 101 92 97  Resp: (!) 30  18 18   Temp: 100 F (37.8 C)  98.8 F (37.1 C)   TempSrc: Oral  Oral   SpO2: 100% (!) 84% (!) 89% 93%   Physical Exam  VS reviewed. Still has hypoxia withoutO2 Constitutional: She is oriented to person, place, and time.  Cardiovascular: Normal rate, regular rhythm, normal heart sounds and intact distal pulses.  No murmur heard. Head and neck: no neck stiffness. Tenderness at shoulder and neck and face muscles. Ear exam: Tympanic membranes are intact. Pulmonary/Chest: Currently on 2 li nasal O2. Effort normal. No accessory muscle usage. No tachypnea. No respiratory distress. Has some diffuse wheezing. Bibasilar mild crackles Abdominal: Soft. There is no tenderness.  Neurological: She is alert and oriented to person, place, and time.  Extremitis: No LE edema   Assessment/Plan:  Active Problems:   Community acquired pneumonia  [name] is a 47 y.o female with IBS, fibromyalgia, HTN, and obesity who presented to the ED with 3-4 days of productive cough, rhinorrhea, and subjective fevers.  Found to have tachypnea with O2 sat of 90% at room air when arrived to ED.  Fever, Cough, SOB and hypoxia: Hypoxia resolved with nasal O2 Mild wheezing at lung auscultation and mostly at upper respiratory track. Mild bibasilar crackle  Exam is not consistent with PNA. Also no sign of PNA on CXR. No leucocytosis, PCT negative. Her symptoms is likely 2/2 viral upper respiratory infection. Respiratory viral panel came back + for Rhinovirus. Saturation improved today.  Talked to the patient about her symptoms are likely due to viral infection, and not PNA at this time but recommended come back to ED if worsened or not improved as she may develop PNA on top of it.  -Evaluate ambulatory O2 saturation without nasal oxygen----> 85-88% -Continue albuterol PRN and supportive care for viral upper respiratory infection -Will observe today to reassure about room air saturation. -Ketorolac PRN for body ache -Nasal O2 PRN 2-4 li  Grade 2 diastolic HF on echo at 30/9407: BNP increased at this admission.  Has mild bibasilar crackle. Her Hx of recent DOE and increased BNP may be in setting of CHF progression.   -IV Lasix 20 mg IV -Recoomend further follow up for repeating Echo out patient if shortness of breath persist after resolving of upper respiratory Infection.  HTN: Slightly hypertensive today. BP 156/100 this AM. likely 2/2 cough and fever.  -Continue home dose of HCTZ 25 mg daily -BMP daily -Monitor BP  Depression: Stable -Continue home dose of duloxetine, clonazepam  Hidradenitis: Small lesion at right axillary area with trace dried drainage. -f/u outpatient if worsened   Diet: 2 gram salt diet Code: Full VTE: Levonox IV fluid: None    Dispo: Anticipated discharge tomorrow Dewayne Hatch, MD 11/08/2017, 12:29 PM Pager: [phone]

## 2017-11-08 NOTE — Progress Notes (Signed)
SATURATION QUALIFICATIONS: (This note is used to comply with regulatory documentation for home oxygen)  Patient Saturations on Room Air at Rest = 87%  Patient Saturations on Room Air while Ambulating = 88%  Patient Saturations on 4Liters of oxygen while Ambulating = 90%  Please briefly explain why patient needs home oxygen: patient c/o of shortness of breath while walking

## 2017-11-08 NOTE — Progress Notes (Signed)
Internal Medicine Attending:   I saw and examined the patient. I reviewed the Dr Darcey Nora note and I agree with the resident's findings and plan as documented in the resident's note. See H&P attestaton for further details, overall she has Acute bronchitis due to Rhinovirus, tx with supportive care, however of note she does have greater than expected hypoxia based on pulse oximeter, this is not well explained by her CXR or medical history, BNP is elevated, no overt pulm edema on exam or CXR, but does have history of DD.  Will give dose of IV lasix and assess response.

## 2017-11-08 NOTE — Progress Notes (Addendum)
At [phone] this AM pt very upset because she says her head is hurting and bp elevated, pt has been coughing. Explained to pt about giving her bp med and pain  Med. Pt wanted something other than tylenol but MD said he did not want to give anthing due to elevated bp. Explained this to pt. MD was notified about bp. See MAR.  1735 notified Md of BP 176/110.

## 2017-11-08 NOTE — Progress Notes (Signed)
BP elevated see flowsheet notified MD of bp.

## 2017-11-09 ENCOUNTER — Other Ambulatory Visit: Payer: Self-pay | Admitting: Internal Medicine

## 2017-11-09 DIAGNOSIS — Z888 Allergy status to other drugs, medicaments and biological substances status: Secondary | ICD-10-CM

## 2017-11-09 DIAGNOSIS — Z6841 Body Mass Index (BMI) 40.0 and over, adult: Secondary | ICD-10-CM

## 2017-11-09 DIAGNOSIS — J9621 Acute and chronic respiratory failure with hypoxia: Secondary | ICD-10-CM

## 2017-11-09 DIAGNOSIS — N926 Irregular menstruation, unspecified: Secondary | ICD-10-CM

## 2017-11-09 DIAGNOSIS — R110 Nausea: Secondary | ICD-10-CM

## 2017-11-09 DIAGNOSIS — I5033 Acute on chronic diastolic (congestive) heart failure: Secondary | ICD-10-CM

## 2017-11-09 DIAGNOSIS — Z23 Encounter for immunization: Secondary | ICD-10-CM | POA: Diagnosis not present

## 2017-11-09 MED ORDER — ALBUTEROL SULFATE HFA 108 (90 BASE) MCG/ACT IN AERS: 2.0000 | INHALATION_SPRAY | Freq: Four times a day (QID) | RESPIRATORY_TRACT | 2 refills | Status: DC | PRN

## 2017-11-09 MED ORDER — INFLUENZA VAC SPLIT QUAD 0.5 ML IM SUSY: 0.5000 mL | PREFILLED_SYRINGE | INTRAMUSCULAR | Status: DC

## 2017-11-09 MED ORDER — LOSARTAN POTASSIUM 25 MG PO TABS: 25.0000 mg | ORAL_TABLET | Freq: Every day | ORAL | 1 refills | Status: DC

## 2017-11-09 MED ORDER — ALBUTEROL SULFATE (2.5 MG/3ML) 0.083% IN NEBU: 2.5000 mg | INHALATION_SOLUTION | RESPIRATORY_TRACT | 12 refills | Status: DC

## 2017-11-09 MED ORDER — ALBUTEROL SULFATE (2.5 MG/3ML) 0.083% IN NEBU
2.5000 mg | INHALATION_SOLUTION | RESPIRATORY_TRACT | Status: DC
  Administered 2017-11-09 (×2): 2.5 mg via RESPIRATORY_TRACT
  Filled 2017-11-09: qty 3

## 2017-11-09 MED ORDER — ALBUTEROL SULFATE (2.5 MG/3ML) 0.083% IN NEBU: 2.5000 mg | INHALATION_SOLUTION | RESPIRATORY_TRACT | Status: DC | PRN

## 2017-11-09 MED ORDER — LOSARTAN POTASSIUM 25 MG PO TABS
25.0000 mg | ORAL_TABLET | Freq: Every day | ORAL | Status: DC
  Administered 2017-11-09: 25 mg via ORAL
  Filled 2017-11-09: qty 1

## 2017-11-09 MED ORDER — INFLUENZA VAC SPLIT QUAD 0.5 ML IM SUSY
0.5000 mL | PREFILLED_SYRINGE | INTRAMUSCULAR | Status: AC | PRN
  Administered 2017-11-09: 0.5 mL via INTRAMUSCULAR
  Filled 2017-11-09: qty 0.5

## 2017-11-09 NOTE — Progress Notes (Signed)
DC and f/u paperwork reviewed with patient with verbal understanding. Awaiting delivery of o2 tank for home use

## 2017-11-09 NOTE — Progress Notes (Addendum)
Spoke w patient. She would like to use [facility] for DME O2. Referral made to [facility], clinical liaison.  O2 and nebulizer will be delivered to room prior to DC. Patient aware this may take two hours for insurance to process and to be delivered. Patient will obtain ride home from family once DME has been delivered to room. No other CM needs, signing off. 14:30 Notified [facility] that patient asking about O2 ETA. They state approx 30 minutes.

## 2017-11-09 NOTE — Progress Notes (Signed)
   Subjective: Patient is doing okay this AM. She was not able to get much sleep because of menstrual irregularities causing frequent bedding changes. She had a good urine output with the dose of IV furosemide yesterday. She feels a little better and would like to go home today. We discussed the plan for repeat ambulatory pulse ox and if okay discharge home. She agrees.   Objective: Vital signs in last 24 hours: Vitals:   11/08/17 1910 11/08/17 2000 11/08/17 2111 11/09/17 0411  BP:   (!) 155/76 (!) 176/115  Pulse:  78 73 82  Resp:   16 15  Temp:   97.8 F (36.6 C) 97.8 F (36.6 C)  TempSrc:   Oral Oral  SpO2: (!) 88% 92% 97% 98%  Weight:    (!) 147.7 kg   General: Obese female in no acute distress Pulm: Good air movement with diffuse wheezing  CV: RRR, no murmurs, no rubs  Abdomen: Soft, non-distended, no tenderness to palpation  Extremities: No LE edema   Assessment/Plan:  Lindsay [name] is a 47 y.o female with IBS, Fibromyalgia, HTN, obesity and HFpEF who presented the ED with 3-4 days of subjective fevers, cough, and rhinorrhea. She was subsequently found to have rhinovirus. We are continuing supportive care as outline below.   Acute Bronchitis secondary to Rhinovirus - Afebrile since admission  - Continuing supportive care  - Recommend using acetaminophen for fevers/pain, avoid NSAIDs due to HTN  - Continue Tessalon perils for cough suppression  - Albuterol PRN   Possible acute on chronic diastolic heart failure with hypoxia - Patient continues to have an oxygen requirement. Goal is to maintain O2 >88%   - Has received lasix x 2  - Will try to ambulate today, please ensure good waveform and pre-treat the patiet with albuterol  - If she remains hypoxic the next step may be to obtain an ABG while on NRB and obtain a repeat CXR   Dispo: Anticipated discharge possibly today is ambulatory pulse ox is within normal range.   Ina Homes, MD 11/09/2017, 7:11 AM

## 2017-11-09 NOTE — Progress Notes (Signed)
Patient Sp02 test in halls on RA. Sp02 consistently dips to mid 80's although patient asymptomatic.   Placed back on 2L Sp02 92-94%

## 2017-11-09 NOTE — Discharge Instructions (Signed)
Thank you for allowing [name] to provide your care. You have bronchitis cause by a virus. It is important that you stay hydrate. I have sent out a prescription of an inhaler and a nebulizer machine. You will also be going home with some oxygen. Please use this until it is determined that you do not need it.   Acute Bronchitis, Adult Acute bronchitis is when air tubes (bronchi) in the lungs suddenly get swollen. The condition can make it hard to breathe. It can also cause these symptoms:  A cough.  Coughing up clear, yellow, or green mucus.  Wheezing.  Chest congestion.  Shortness of breath.  A fever.  Body aches.  Chills.  A sore throat.  Follow these instructions at home: Medicines  Take over-the-counter and prescription medicines only as told by your doctor.  If you were prescribed an antibiotic medicine, take it as told by your doctor. Do not stop taking the antibiotic even if you start to feel better. General instructions  Rest.  Drink enough fluids to keep your pee (urine) clear or pale yellow.  Avoid smoking and secondhand smoke. If you smoke and you need help quitting, ask your doctor. Quitting will help your lungs heal faster.  Use an inhaler, cool mist vaporizer, or humidifier as told by your doctor.  Keep all follow-up visits as told by your doctor. This is important. How is this prevented? To lower your risk of getting this condition again:  Wash your hands often with soap and water. If you cannot use soap and water, use hand sanitizer.  Avoid contact with people who have cold symptoms.  Try not to touch your hands to your mouth, nose, or eyes.  Make sure to get the flu shot every year.  Contact a doctor if:  Your symptoms do not get better in 2 weeks. Get help right away if:  You cough up blood.  You have chest pain.  You have very bad shortness of breath.  You become dehydrated.  You faint (pass out) or keep feeling like you are going to pass  out.  You keep throwing up (vomiting).  You have a very bad headache.  Your fever or chills gets worse. This information is not intended to replace advice given to you by your health care provider. Make sure you discuss any questions you have with your health care provider. Document Released: 07/03/2007 Document Revised: 08/23/2015 Document Reviewed: 07/05/2015 Elsevier Interactive Patient Education  [employer].

## 2017-11-09 NOTE — Discharge Summary (Addendum)
Name: Lindsay [name] MRN: [id number] DOB: [birth year] 47 y.o. PCP: Velna Ochs, MD  Date of Admission: 11/07/2017 11:39 AM Date of Discharge:  Attending Physician: Lucious Groves, DO  Discharge Diagnosis: 1. Acute Bronchitis 2/2 Rhinovirus  2. Acute on Chronic Respiratory Failure with Hypoxia  3. Hypertension   Discharge Medications: Allergies as of 11/09/2017      Reactions   Lisinopril Cough      Medication List    STOP taking these medications   TUSSIN DM 10-100 MG/5ML liquid Generic drug:  Dextromethorphan-guaiFENesin     TAKE these medications   albuterol (2.5 MG/3ML) 0.083% nebulizer solution Commonly known as:  PROVENTIL Take 3 mLs (2.5 mg total) by nebulization every 4 (four) hours.   albuterol 108 (90 Base) MCG/ACT inhaler Commonly known as:  PROVENTIL HFA;VENTOLIN HFA Inhale 2 puffs into the lungs every 6 (six) hours as needed for wheezing or shortness of breath.   benzonatate 100 MG capsule Commonly known as:  TESSALON Take 1 capsule (100 mg total) by mouth 3 (three) times daily as needed for cough.   Brexpiprazole 1 MG Tabs Take 2 tablets (2 mg total) by mouth daily.   cholecalciferol 1000 units tablet Commonly known as:  VITAMIN D Take 1 tablet (1,000 Units total) by mouth daily.   clindamycin 1 % gel Commonly known as:  CLINDAGEL Apply topically 2 (two) times daily. Apply to left axilla. DIAG CODE L73.2   clorazepate 15 MG tablet Commonly known as:  TRANXENE TAKE 1 TABLET BY MOUTH EVERY DAY IN THE MORNING   cyclobenzaprine 5 MG tablet Commonly known as:  FLEXERIL Take 1 tablet (5 mg total) by mouth 3 (three) times daily as needed for muscle spasms.   DULoxetine 60 MG capsule Commonly known as:  CYMBALTA TAKE 1 CAPSULE (60 MG TOTAL) BY MOUTH 2 (TWO) TIMES DAILY.   ferrous sulfate 325 (65 FE) MG tablet Take 1 tablet (325 mg total) by mouth daily.   fluticasone 50 MCG/ACT nasal spray Commonly known as:  FLONASE Place 2 sprays into  both nostrils daily.   guaiFENesin 600 MG 12 hr tablet Commonly known as:  MUCINEX Take 600 mg by mouth 2 (two) times daily as needed for cough or to loosen phlegm.   hydrochlorothiazide 25 MG tablet Commonly known as:  HYDRODIURIL TAKE 1 TABLET BY MOUTH EVERY DAY   hydrOXYzine 50 MG capsule Commonly known as:  VISTARIL Take one capsule daily as needed for anxiety   losartan 25 MG tablet Commonly known as:  COZAAR Take 1 tablet (25 mg total) by mouth daily. Start taking on:  11/10/2017   LYRICA 100 MG capsule Generic drug:  pregabalin TAKE 1 CAPSULE BY MOUTH THREE TIMES A DAY What changed:  See the new instructions.   multivitamin with minerals Tabs tablet Take 1 tablet by mouth daily.   omeprazole 40 MG capsule Commonly known as:  PRILOSEC Take 1 capsule (40 mg total) by mouth daily.   promethazine 25 MG tablet Commonly known as:  PHENERGAN Take 1 tablet (25 mg total) by mouth 2 (two) times daily as needed for nausea or vomiting.   vitamin B-12 1000 MCG tablet Commonly known as:  CYANOCOBALAMIN Take 1 tablet (1,000 mcg total) by mouth daily.   vitamin C 500 MG tablet Commonly known as:  ASCORBIC ACID Take 500 mg by mouth daily.   Vitamin D (Ergocalciferol) 50000 units Caps capsule Commonly known as:  DRISDOL Take 1 capsule (50,000 Units total) by mouth every 7 (  seven) days.            Durable Medical Equipment  (From admission, onward)         Start     Ordered   11/09/17 1049  For home use only DME oxygen  Once    Question Answer Comment  Mode or (Route) Nasal cannula   Liters per Minute 2   Frequency Continuous (stationary and portable oxygen unit needed)   Oxygen conserving device No   Oxygen delivery system Gas      11/09/17 1048   11/09/17 0000  DME Nebulizer machine    Question Answer Comment  Patient needs a nebulizer to treat with the following condition Hypoxia   Patient needs a nebulizer to treat with the following condition Bronchitis       11/09/17 1100         Disposition and follow-up:   [name] was discharged from [facility] in Stable condition.  At the hospital follow up visit please address:  1.  Acute on Chronic Respiratory Failure with Hypoxia. Please preform an ambulatory pulse ox in clinic to assess the need for continue oxygen therapy. If she continues to need oxygen further investigation should be pursued. It is possible it is related to her HFpEF although her CXR does not show significant pulmonary edema. I would recommend placing the patient on NRB and obtaining an ABG to assess her A-a gradient. This would help to narrow the differential. Of note her bicarb is elevated to 27 which indicates this may be more of a chronic process. Hypertension. Patient was consistently hypertensive while hospitalized losartan was added to her medication regimen.   2.  Labs / imaging needed at time of follow-up: Ambulatory pulse ox  3.  Pending labs/ test needing follow-up: None  Follow-up Appointments: Follow-up Information    Velna Ochs, MD Follow up.   Specialty:  Internal Medicine Contact information: [place] [address] [phone]        Pixie Casino, MD .   Specialty:  Cardiology Contact information: [place] [address] [place] Hospital Course by problem list:  Acute Bronchitis 2/2 Rhinovirus. [name] is a 47 y.o female with IBS, Fibromyalgia, HTN, obesity and HFpEF who presented the ED with 3-4 days of subjective fevers, cough, and rhinorrhea. She was subsequently found to have rhinovirus. She was treated with symptomatic management.   Acute on Chronic Respiratory Failure with Hypoxia. [name] was found to have a new oxygen requirement on admission, primarily with ambulation. Chest x-ray did not show overt pulmonary edema; however, her BNP was elevated at 295 (likely falsely low given her obesity).  Diuresis was attempted with IV furosemide 20 mg. She had adequate response and it somewhat decreased her oxygen requirement from 4 L per minute with ambulation to 2 L per minute with ambulation. She was discharged home on 2 L per minute nasal cannula with ambulation. At follow-up I would recommend repeating an ambulatory pulse ox to assess the patient's need for continued oxygen and diuretic therapy. Her etiology is likely multifactorial with her HFpEF and obesity. Her bicarbonate of 27 indicates that she may be a chronic CO2 retainer from her obesity which can lead to hypoxia.  Hypertension. [name] remains hypertensive, both systolic and diastolic, throughout her hospitalization. Outpatient records were reviewed that she was noted to be on hydrochlorothiazide 25 mg daily. She was  previously on lisinopril however this was stopped due to cough. Losartan 25 mg is added to her medication regimen.  Discharge Vitals:   BP (!) 152/72   Pulse 81   Temp 98.8 F (37.1 C) (Oral)   Resp 18   Wt (!) 147.7 kg   LMP 11/07/2017   SpO2 96%   BMI 47.74 kg/m   Discharge Instructions: Discharge Instructions    DME Nebulizer machine   Complete by:  As directed    Patient needs a nebulizer to treat with the following condition:   Hypoxia Bronchitis     Diet - low sodium heart healthy   Complete by:  As directed    Increase activity slowly   Complete by:  As directed      Signed: Ina Homes, MD 11/09/2017, 11:00 AM

## 2017-11-10 ENCOUNTER — Ambulatory Visit: Payer: Self-pay

## 2017-11-10 DIAGNOSIS — J9620 Acute and chronic respiratory failure, unspecified whether with hypoxia or hypercapnia: Secondary | ICD-10-CM | POA: Diagnosis not present

## 2017-11-10 NOTE — Telephone Encounter (Signed)
Refill declined. See last telephone note from me on 10/1.

## 2017-11-10 NOTE — Telephone Encounter (Signed)
Next appt scheduled 10/21 with PCP.

## 2017-11-11 DIAGNOSIS — J9620 Acute and chronic respiratory failure, unspecified whether with hypoxia or hypercapnia: Secondary | ICD-10-CM | POA: Diagnosis not present

## 2017-11-13 ENCOUNTER — Telehealth: Payer: Self-pay | Admitting: Internal Medicine

## 2017-11-13 NOTE — Telephone Encounter (Signed)
Thank you helen. Yes sounds like she needs to be seen. If she is feeling worse and cannot get transportation to come to [facility], she needs to present to the ER.

## 2017-11-13 NOTE — Telephone Encounter (Signed)
Larene Beach stated that pt doesn't have any home health services on file, pt does seems like she still have some congestion; pls contact [phone]

## 2017-11-13 NOTE — Telephone Encounter (Signed)
Spoke to [facility] nurse, she is concerned after speaking w/ pt via ph that pt "sounds bad" but she states pt has transportation problems and could not come in today for f/u since admission. Pt does have appt Monday. Larene Beach will try to help w/ transportation and have pt call for appt in [facility] tomorrow. She also ask about HH resp support.  Will send to pcp

## 2017-11-17 ENCOUNTER — Ambulatory Visit (INDEPENDENT_AMBULATORY_CARE_PROVIDER_SITE_OTHER): Payer: Medicare Other | Admitting: Internal Medicine

## 2017-11-17 ENCOUNTER — Other Ambulatory Visit: Payer: Self-pay

## 2017-11-17 ENCOUNTER — Other Ambulatory Visit: Payer: Self-pay | Admitting: Internal Medicine

## 2017-11-17 ENCOUNTER — Telehealth: Payer: Self-pay | Admitting: *Deleted

## 2017-11-17 ENCOUNTER — Encounter: Payer: Self-pay | Admitting: Internal Medicine

## 2017-11-17 VITALS — BP 157/95 | HR 86 | Temp 99.2°F | Ht 71.0 in | Wt 330.9 lb

## 2017-11-17 DIAGNOSIS — M79605 Pain in left leg: Secondary | ICD-10-CM | POA: Diagnosis not present

## 2017-11-17 DIAGNOSIS — J9601 Acute respiratory failure with hypoxia: Secondary | ICD-10-CM

## 2017-11-17 DIAGNOSIS — M79604 Pain in right leg: Secondary | ICD-10-CM

## 2017-11-17 DIAGNOSIS — E611 Iron deficiency: Secondary | ICD-10-CM

## 2017-11-17 DIAGNOSIS — Z79899 Other long term (current) drug therapy: Secondary | ICD-10-CM

## 2017-11-17 DIAGNOSIS — J206 Acute bronchitis due to rhinovirus: Secondary | ICD-10-CM | POA: Diagnosis not present

## 2017-11-17 DIAGNOSIS — I10 Essential (primary) hypertension: Secondary | ICD-10-CM

## 2017-11-17 LAB — D-DIMER, QUANTITATIVE (NOT AT ARMC): D DIMER QUANT: 0.78 ug{FEU}/mL — AB (ref 0.00–0.50)

## 2017-11-17 LAB — FERRITIN: FERRITIN: 22 ng/mL (ref 11–307)

## 2017-11-17 MED ORDER — FUROSEMIDE 10 MG/ML IJ SOLN
20.0000 mg | Freq: Once | INTRAMUSCULAR | Status: AC
  Administered 2017-11-17: 20 mg via INTRAMUSCULAR

## 2017-11-17 MED ORDER — HYDROCHLOROTHIAZIDE 25 MG PO TABS: 25.0000 mg | ORAL_TABLET | Freq: Every day | ORAL | 3 refills | Status: DC

## 2017-11-17 MED ORDER — KETOROLAC TROMETHAMINE 30 MG/ML IJ SOLN
60.0000 mg | Freq: Once | INTRAMUSCULAR | Status: AC
  Administered 2017-11-17: 60 mg via INTRAVENOUS

## 2017-11-17 MED ORDER — FUROSEMIDE 20 MG PO TABS: 20.0000 mg | ORAL_TABLET | Freq: Every day | ORAL | 0 refills | Status: DC

## 2017-11-17 MED ORDER — KETOROLAC TROMETHAMINE 10 MG PO TABS: 10.0000 mg | ORAL_TABLET | Freq: Four times a day (QID) | ORAL | 0 refills | Status: DC | PRN

## 2017-11-17 MED ORDER — KETOROLAC TROMETHAMINE 30 MG/ML IJ SOLN: 60.0000 mg | Freq: Once | INTRAMUSCULAR | Status: DC

## 2017-11-17 NOTE — Assessment & Plan Note (Signed)
Patient is non compliant with iron supplements because they upset her stomach. Will recheck ferritin today and arrange for IV iron infusion if needed.

## 2017-11-17 NOTE — Assessment & Plan Note (Signed)
Blood pressure is uncontrolled, 155/93. There was some confusion about her medications on discharge. Patient was started on losartan in addition to her HCTZ, however she was under the impression HCTZ was discontinued and has only been taking losartan. Instructed patient to take both. Refills sent to pharmacy.  -- Continue Losartan 25 mg daily -- Re start HCTZ 25 mg daily

## 2017-11-17 NOTE — Progress Notes (Signed)
   CC: Hypoxia follow up  HPI:  [name] is a 47 y.o. female with past medical history outlined below here for hypoxia follow up. For the details of today's visit, please refer to the assessment and plan.  Past Medical History:  Diagnosis Date  . Acid reflux   . Anemia    Iron Def  . Anorexia   . Chronic kidney disease    Nephrotic syndrome  . Colon polyp 2009  . Depression with anxiety   . Edema leg   . Fibromyalgia   . Hemorrhoids   . Hidradenitis suppurativa   . Hypertension   . IBS (irritable bowel syndrome)   . Low back pain   . Migraines   . Morbidly obese ([place])   . Neuromuscular disorder (HCC)    fibromyalgia  . Neuropathy   . Panic attacks   . Polyp of vocal cord or larynx   . Tonsil pain     Review of Systems  Respiratory: Positive for cough and shortness of breath.     Physical Exam:  Vitals:   11/17/17 1317 11/17/17 1342  BP: (!) 155/93 (!) 157/95  Pulse: 97 86  Temp: 99.2 F (37.3 C)   TempSrc: Oral   SpO2: 93% 93%  Weight: (!) 330 lb 14.4 oz (150.1 kg)   Height: 5\' 11"  (1.803 m)     Constitutional: Obese, NAD, appears comfortable Cardiovascular: RRR, no murmurs, rubs, or gallops.  Pulmonary/Chest: CTAB, no wheezes, rales, or rhonchi.  Extremities: Warm and well perfused. No edema.  Psychiatric: Normal mood and affect  Assessment & Plan:   See Encounters Tab for problem based charting.  Patient discussed with Dr. Lynnae January

## 2017-11-17 NOTE — Progress Notes (Signed)
IV Lasix given with IV NS at 100 cc/HR per hospital policy.[id number]. [place]  [id number].  Patient tolerated well.  IV was discontinued after Lasix and Toradol administration.  Dry dressing was applied to site.   Sander Nephew, RN 11/17/2017 4:43 PM.

## 2017-11-17 NOTE — Assessment & Plan Note (Signed)
Symptoms are slowly improving. Continue supportive treatment with mucinex & rest.

## 2017-11-17 NOTE — Patient Instructions (Addendum)
Ms. Bobb,  It was a pleasure to see you. I am sorry to hear you are still not feeling well. I will call you with the results of your blood work today. We have given you a dose of IV lasix that will help pull fluid off your lungs. Tomorrow, please start taking lasix 20 mg once daily until your follow up appointment on Friday.   For your blood pressure, please take both losartan (the new medication) and hydrochlorothiazide once daily. I have sent refills to your pharmacy.   If you have any questions or concerns, call our clinic at [phone] or after hours call [phone] and ask for the internal medicine resident on call. Thank you!  Dr. Philipp Ovens

## 2017-11-17 NOTE — Assessment & Plan Note (Signed)
Patient requesting dose of toradol and prescription as this has previously helped. Prior work up for etiology negative aside from iron deficiency anemia. Repeating ferritin, will arrange for IV iron if persistently low. -- IV toradol 60 x 1 -- Toradol 10 mg q6h prn

## 2017-11-17 NOTE — Telephone Encounter (Addendum)
Alerted by lab staff that patient's D Dimer elevated to 0.78. Page placed to Dr. Philipp Ovens. Dr. Eppie Gibson aware.  Dr. Philipp Ovens and Dr. Eppie Gibson are in communication regarding result via text messages. Hubbard Hartshorn, RN, BSN

## 2017-11-17 NOTE — Assessment & Plan Note (Addendum)
Patient is here for hospital follow up. She was recently discharged on 11/09/2017 after admission for acute bronchitis 2/2 rhinovirus and new onset acute hypoxic respiratory failure. Hypoxia seemed to be out of proportion to her viral bronchitis, and there was some concern for possible decompensated CHF. She has no prior cardiac history. Echocardiogram obtain in April of 2019 for heart palpitations demonstrated normal EF with grade 1 diastolic dysfunction. In the hospital she was given IV lasix 20 mg x 1 with good UOP and improvement in her oxygen requirement from 4L -> 2L. She was discharged on home oxygen. On arrival today, she was saturating 93% at rest on RA, but desaturated quickly to 78% when ambulating. She recovered to 98% on 2L [place]. Again - unclear reason for persistent hypoxia which is not explained by her viral bronchitis. Weight is up 3 kg since hospital discharge. I do not appreciate crackles on ascultation but patient is morbidly obese and exam is limited. Volume status is difficult to assess. PE is another consideration, however her pre test probability is low. Given that patient previously responded well to diuretics, will empirically treat as decompensated diastolic HF exacerbation. Will also check d-dimer to hopefully rule out PE.  -- IV lasix 20 mg x 1 given in clinic -- PO lasix 20 mg daily until follow up later this week -- Check BMP at follow up -- F/u D-dimer, if elevated will need to pursue further work up with CTA chest  -- If hypoxia does not respond to diuresis and d-dimer is normal, may need to evaluate for shunt etiology   ADDENDUM: D-dimer elevated. Called patient with results, explained PE cannot be ruled out and further testing is needed. CTA chest ordered. She reports feeling "a little better" than yesterday. Still short of breath.

## 2017-11-18 DIAGNOSIS — J9620 Acute and chronic respiratory failure, unspecified whether with hypoxia or hypercapnia: Secondary | ICD-10-CM | POA: Diagnosis not present

## 2017-11-18 NOTE — Addendum Note (Signed)
Addended by: Jodean Lima on: 11/18/2017 09:03 AM   Modules accepted: Orders

## 2017-11-18 NOTE — Progress Notes (Signed)
Internal Medicine Clinic Attending  Case discussed with Dr. Philipp Ovens at the time of the visit.  We reviewed the resident's history and exam and pertinent patient test results.  I agree with the assessment, diagnosis, and plan of care documented in the resident's note. Pt did have recent hospitalization with new 02 demand. Well's low risk. D Dimer elevated. Agree with CT chest.

## 2017-11-19 ENCOUNTER — Ambulatory Visit (HOSPITAL_COMMUNITY): Payer: Self-pay | Admitting: Licensed Clinical Social Worker

## 2017-11-21 ENCOUNTER — Ambulatory Visit (HOSPITAL_COMMUNITY): Payer: Medicare Other | Attending: Internal Medicine

## 2017-11-21 ENCOUNTER — Ambulatory Visit: Payer: Self-pay

## 2017-11-21 DIAGNOSIS — J9620 Acute and chronic respiratory failure, unspecified whether with hypoxia or hypercapnia: Secondary | ICD-10-CM | POA: Diagnosis not present

## 2017-11-25 ENCOUNTER — Other Ambulatory Visit: Payer: Self-pay | Admitting: Internal Medicine

## 2017-11-25 DIAGNOSIS — J9601 Acute respiratory failure with hypoxia: Secondary | ICD-10-CM

## 2017-11-25 DIAGNOSIS — R110 Nausea: Secondary | ICD-10-CM

## 2017-11-26 NOTE — Telephone Encounter (Signed)
Phenergan refill declined again. Needs to follow up with GI.

## 2017-11-28 ENCOUNTER — Telehealth: Payer: Self-pay

## 2017-11-28 DIAGNOSIS — M79605 Pain in left leg: Principal | ICD-10-CM

## 2017-11-28 DIAGNOSIS — G8929 Other chronic pain: Secondary | ICD-10-CM

## 2017-11-28 NOTE — Telephone Encounter (Signed)
Spoke to pt, made f/u appt for wed 11/6, will have CT 11/5. She also ask about promethazine and ketorolac, informed her that GI would need to be asked for promethazine, she is agreeable but she states she would like dr guilloud to consider some ketorolac tablets, states she would like it for h/a and pain from coughing. Please advise, is appt wed ok?

## 2017-11-28 NOTE — Telephone Encounter (Signed)
Needs to speak with a nurse about meds. Please call back.  

## 2017-12-02 ENCOUNTER — Other Ambulatory Visit: Payer: Self-pay | Admitting: Internal Medicine

## 2017-12-02 ENCOUNTER — Ambulatory Visit (HOSPITAL_COMMUNITY): Admission: RE | Admit: 2017-12-02 | Payer: Medicare Other | Source: Ambulatory Visit

## 2017-12-02 NOTE — Telephone Encounter (Signed)
I gave prescription for ketoralac (toradol) on 10/21. Does she needs a refill?

## 2017-12-03 ENCOUNTER — Ambulatory Visit: Payer: Self-pay

## 2017-12-03 MED ORDER — KETOROLAC TROMETHAMINE 10 MG PO TABS: 10.0000 mg | ORAL_TABLET | Freq: Four times a day (QID) | ORAL | 1 refills | Status: DC | PRN

## 2017-12-03 NOTE — Addendum Note (Signed)
Addended by: Jodean Lima on: 12/03/2017 01:59 PM   Modules accepted: Orders

## 2017-12-03 NOTE — Telephone Encounter (Signed)
She states she needs refill

## 2017-12-03 NOTE — Telephone Encounter (Signed)
Done

## 2017-12-03 NOTE — Telephone Encounter (Signed)
Refill sent to pharmacy.   

## 2017-12-04 ENCOUNTER — Ambulatory Visit (HOSPITAL_COMMUNITY)
Admission: RE | Admit: 2017-12-04 | Discharge: 2017-12-04 | Disposition: A | Discharge status: Home / Self Care | Payer: Medicare Other | Source: Ambulatory Visit | Attending: Internal Medicine | Admitting: Internal Medicine

## 2017-12-04 DIAGNOSIS — J9601 Acute respiratory failure with hypoxia: Secondary | ICD-10-CM | POA: Insufficient documentation

## 2017-12-04 DIAGNOSIS — R0902 Hypoxemia: Secondary | ICD-10-CM | POA: Diagnosis not present

## 2017-12-04 IMAGING — CT CT ANGIO CHEST
2 of 6 series · 18 of 36 positions shown · IV contrast (iopamidol)
Comparison: Chest radiography [DATE]

CLINICAL DATA: Poor oxygen saturation.

EXAM:
CT ANGIOGRAPHY CHEST WITH CONTRAST
TECHNIQUE: Multidetector CT imaging of the chest was performed using the
standard protocol during bolus administration of intravenous
contrast. Multiplanar CT image reconstructions and MIPs were
obtained to evaluate the vascular anatomy.
CONTRAST:  100mL [VK] IOPAMIDOL ([VK]) INJECTION 76%

[Series 7: pe thins · axial · 0.83mm/px · z∈[+970,+1293]mm · 17 of 514 slices shown]
[im 26/514  lung]
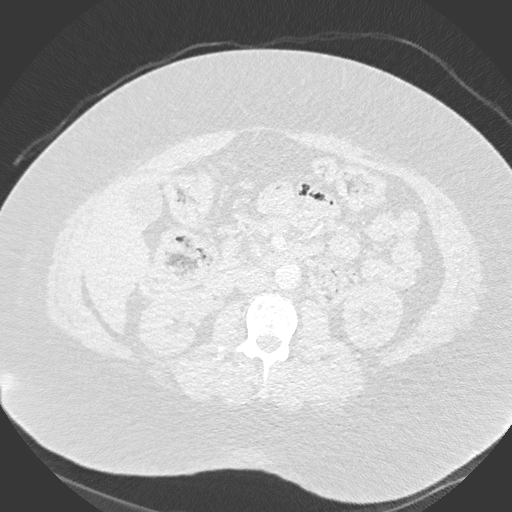
[im 52/514  mediastinal]
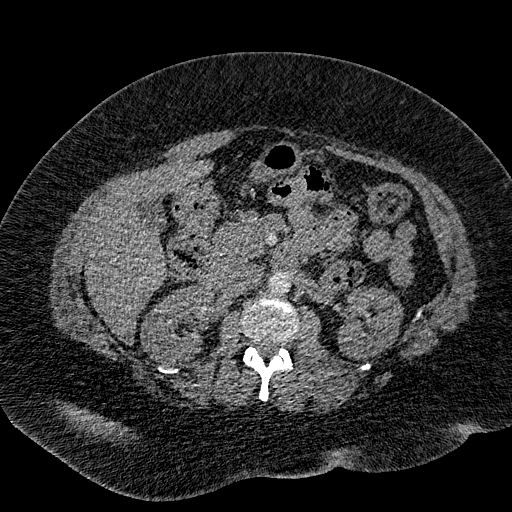
[im 77/514  lung]
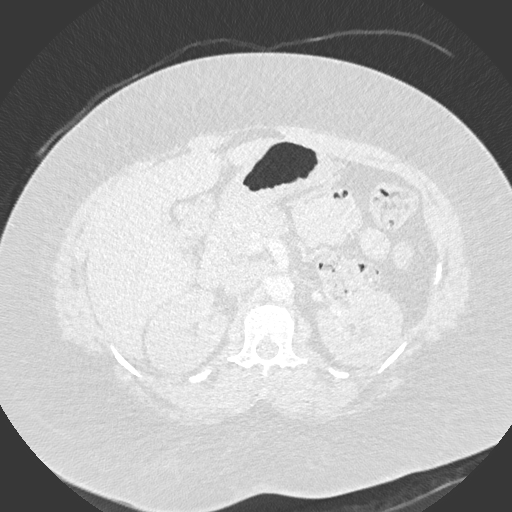
[im 103/514  mediastinal]
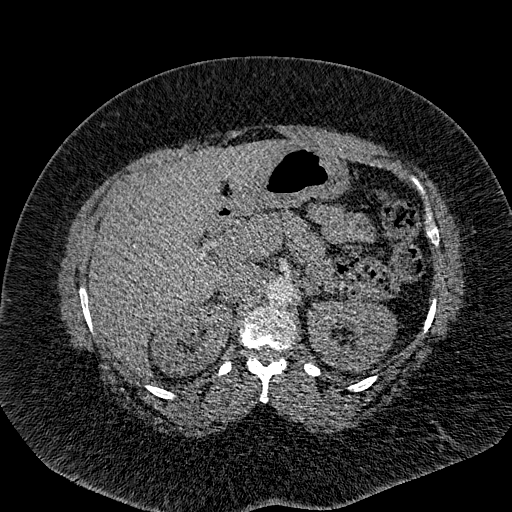
[im 154/514  lung]
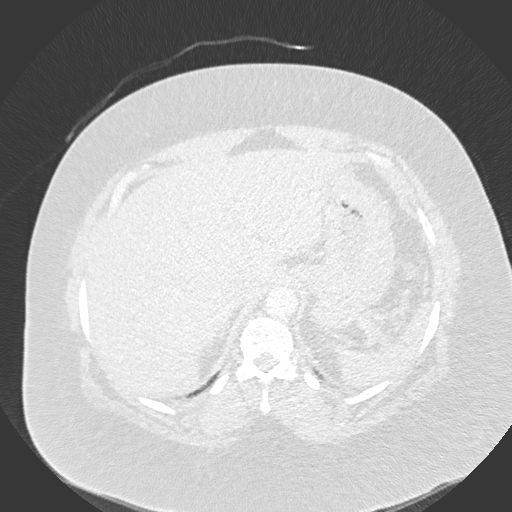
[im 180/514  mediastinal]
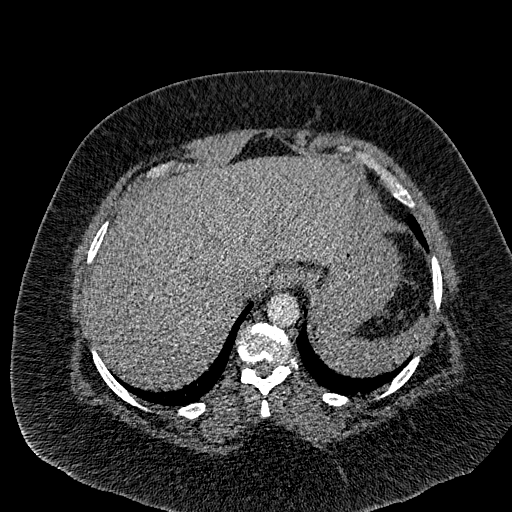
[im 206/514  lung]
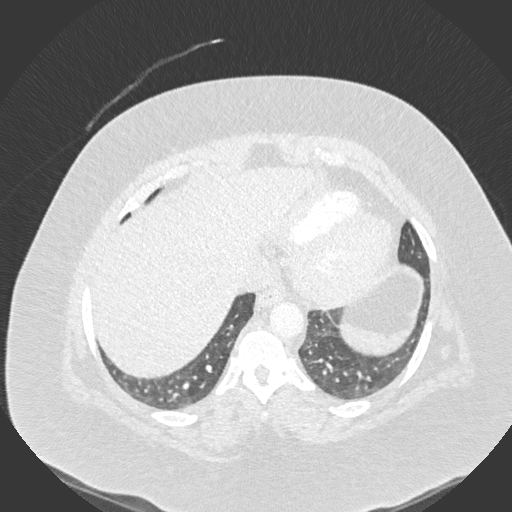
[im 231/514  mediastinal]
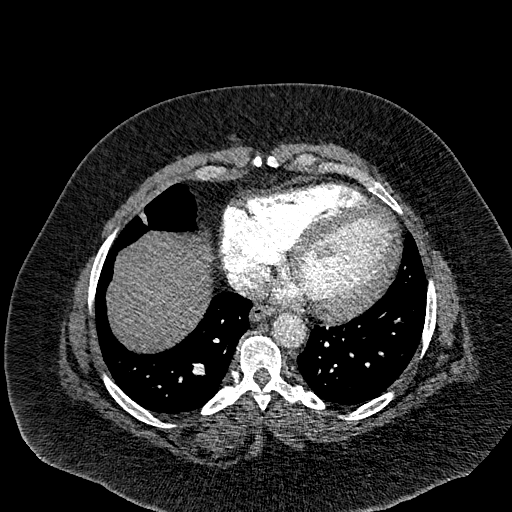
[im 257/514  lung]
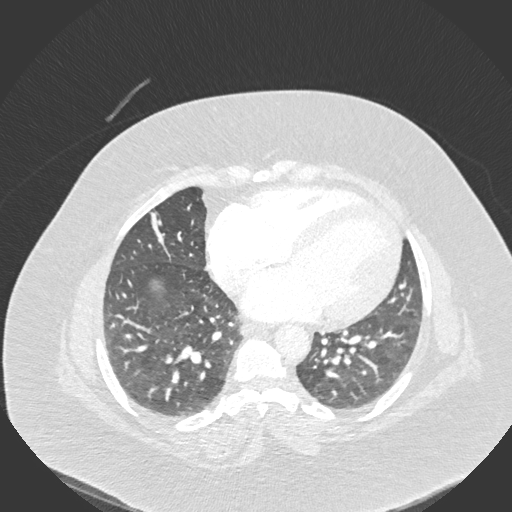
[im 283/514  mediastinal]
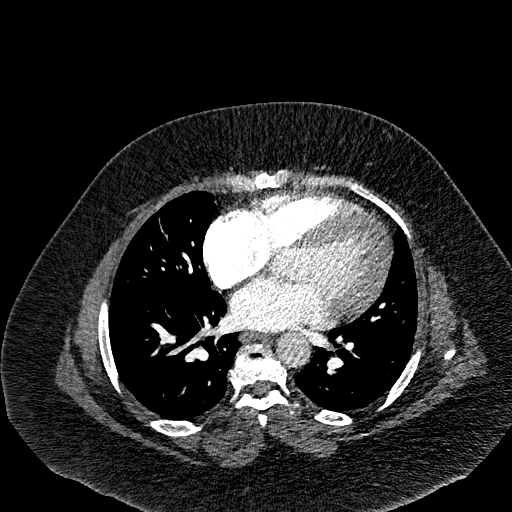
[im 308/514  lung]
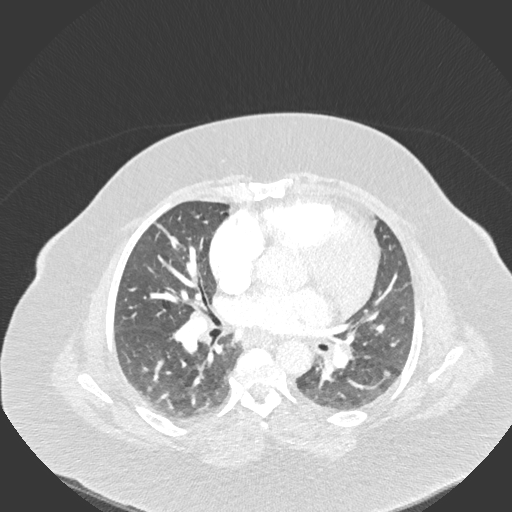
[im 334/514  mediastinal]
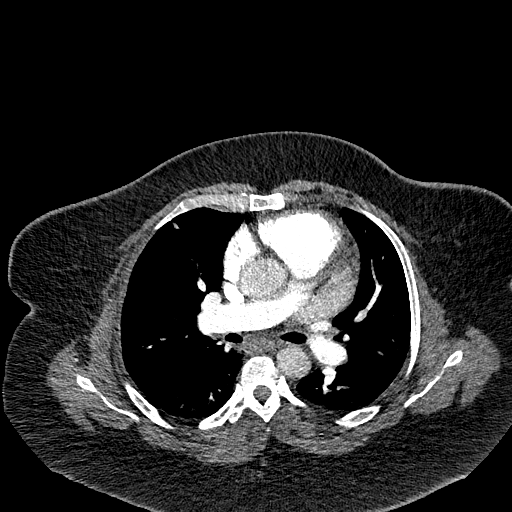
[im 360/514  lung]
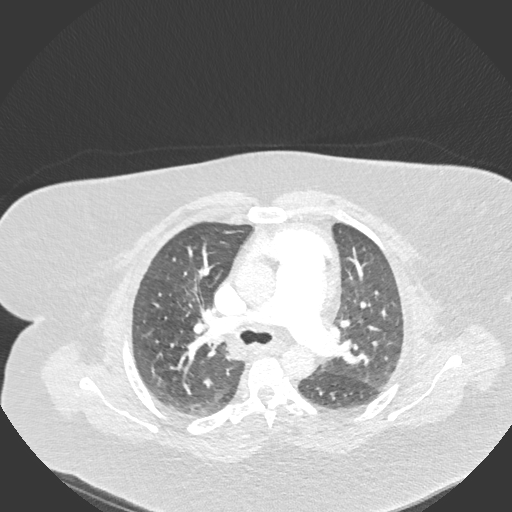
[im 411/514  mediastinal]
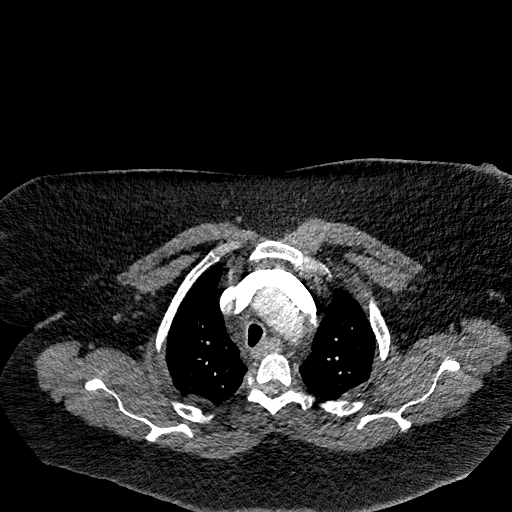
[im 437/514  lung]
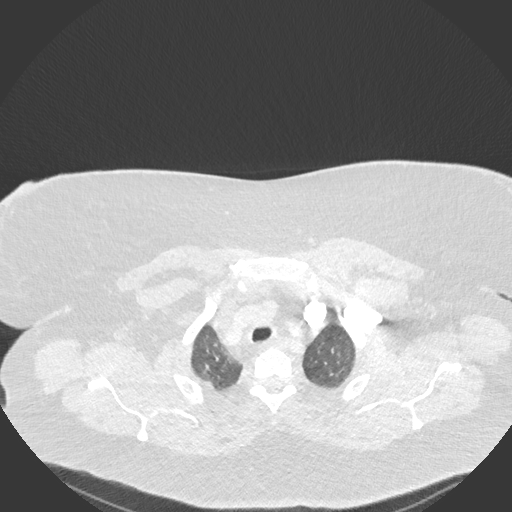
[im 462/514  mediastinal]
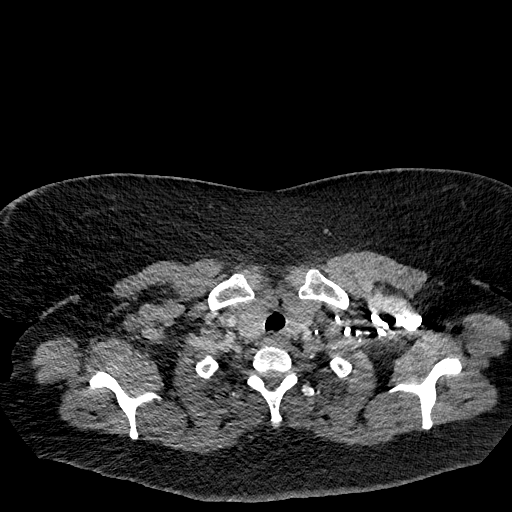
[im 488/514  lung]
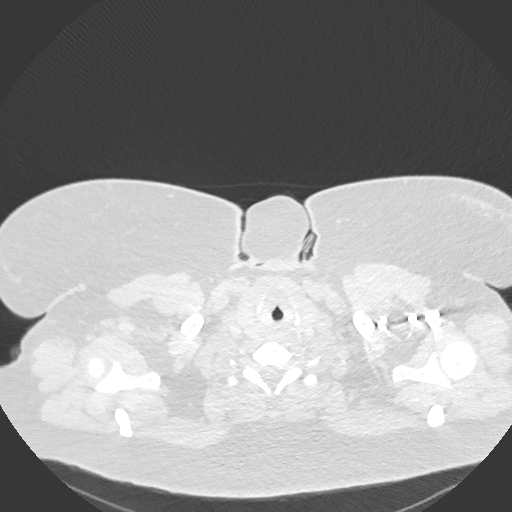

[Series 8: pe 2mm cor · coronal · 0.73mm/px · 1 of 151 slices shown]
[im 76/151  mediastinal]
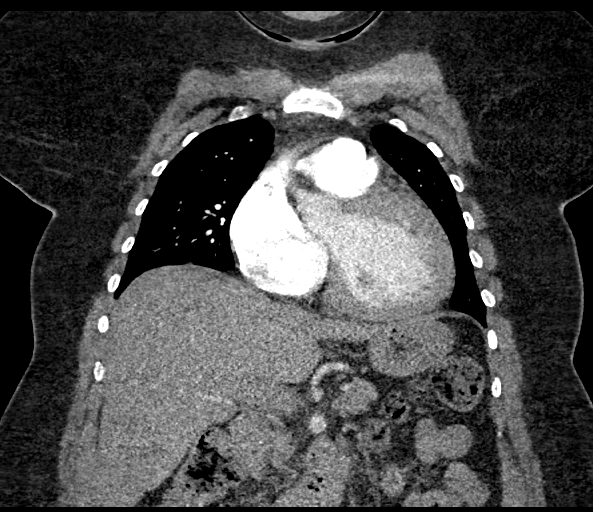

[18 of 36 positions shown; findings below may reference images not displayed]

FINDINGS: Cardiovascular: Pulmonary arterial opacification is excellent. There
are no pulmonary emboli. Heart size is normal. No pericardial fluid.
No aortic atherosclerosis identified. No coronary artery
calcification is seen. The pulmonary artery is large, however,
measuring up to 4 cm in diameter. This suggest pulmonary arterial
hypertension.

Mediastinum/Nodes: No mass or lymphadenopathy.

Lungs/Pleura: No pleural effusion. The left lung does not show any
focal lesion. There is a band of atelectasis within the right middle
lobe. I do not see an endobronchial lesion in the right middle lobe
bronchial tree. Mild mosaic attenuation probably secondary to
pulmonary arterial disease. Cannot rule out the earliest findings of
centrilobular emphysema.

Upper Abdomen: Normal

Musculoskeletal: Mild spinal curvature. No acute or significant bone
finding.

Review of the MIP images confirms the above findings.
IMPRESSION: No pulmonary emboli. Pulmonary artery is large, measuring up to 4
cm. This suggests pulmonary arterial hypertensive pathology. There
is also mosaic attenuation of the lungs that could be secondary to
pulmonary vascular pathology. Cannot rule out the earliest findings
of central lobular emphysema.

Band of linear atelectasis in the right middle lobe, etiology
uncertain. I do not see a proximal endobronchial lesion.

## 2017-12-04 MED ORDER — IOPAMIDOL (ISOVUE-370) INJECTION 76%
100.0000 mL | Freq: Once | INTRAVENOUS | Status: AC | PRN
  Administered 2017-12-04: 100 mL via INTRAVENOUS

## 2017-12-05 ENCOUNTER — Other Ambulatory Visit: Payer: Self-pay

## 2017-12-05 ENCOUNTER — Ambulatory Visit (INDEPENDENT_AMBULATORY_CARE_PROVIDER_SITE_OTHER): Payer: Medicare Other | Admitting: Internal Medicine

## 2017-12-05 VITALS — BP 147/90 | HR 77 | Temp 98.9°F | Ht 71.0 in | Wt 339.6 lb

## 2017-12-05 DIAGNOSIS — J9601 Acute respiratory failure with hypoxia: Secondary | ICD-10-CM | POA: Diagnosis not present

## 2017-12-05 DIAGNOSIS — R05 Cough: Secondary | ICD-10-CM

## 2017-12-05 DIAGNOSIS — K047 Periapical abscess without sinus: Secondary | ICD-10-CM | POA: Insufficient documentation

## 2017-12-05 DIAGNOSIS — M549 Dorsalgia, unspecified: Secondary | ICD-10-CM

## 2017-12-05 DIAGNOSIS — G8929 Other chronic pain: Secondary | ICD-10-CM | POA: Insufficient documentation

## 2017-12-05 DIAGNOSIS — M79601 Pain in right arm: Secondary | ICD-10-CM | POA: Insufficient documentation

## 2017-12-05 DIAGNOSIS — I10 Essential (primary) hypertension: Secondary | ICD-10-CM | POA: Diagnosis not present

## 2017-12-05 DIAGNOSIS — R059 Cough, unspecified: Secondary | ICD-10-CM

## 2017-12-05 DIAGNOSIS — Z9981 Dependence on supplemental oxygen: Secondary | ICD-10-CM

## 2017-12-05 DIAGNOSIS — Z79899 Other long term (current) drug therapy: Secondary | ICD-10-CM

## 2017-12-05 DIAGNOSIS — F1190 Opioid use, unspecified, uncomplicated: Secondary | ICD-10-CM | POA: Insufficient documentation

## 2017-12-05 DIAGNOSIS — Z87891 Personal history of nicotine dependence: Secondary | ICD-10-CM

## 2017-12-05 LAB — INFLUENZA PANEL BY PCR (TYPE A & B)
Influenza A By PCR: NEGATIVE
Influenza B By PCR: NEGATIVE

## 2017-12-05 LAB — POCT URINALYSIS DIPSTICK
Bilirubin, UA: NEGATIVE
Blood, UA: NEGATIVE
Glucose, UA: NEGATIVE
Ketones, UA: NEGATIVE
LEUKOCYTES UA: NEGATIVE
Nitrite, UA: NEGATIVE
PROTEIN UA: NEGATIVE
Spec Grav, UA: 1.025 (ref 1.010–1.025)
Urobilinogen, UA: 0.2 E.U./dL
pH, UA: 6.5 (ref 5.0–8.0)

## 2017-12-05 MED ORDER — FUROSEMIDE 20 MG PO TABS: 40.0000 mg | ORAL_TABLET | Freq: Every day | ORAL | 0 refills | Status: DC

## 2017-12-05 MED ORDER — AMOXICILLIN-POT CLAVULANATE 875-125 MG PO TABS: 1.0000 | ORAL_TABLET | Freq: Two times a day (BID) | ORAL | 0 refills | Status: AC

## 2017-12-05 MED ORDER — FLUCONAZOLE 150 MG PO TABS: 150.0000 mg | ORAL_TABLET | Freq: Every day | ORAL | 0 refills | Status: DC

## 2017-12-05 NOTE — Assessment & Plan Note (Addendum)
HPI: Patient reports that her right side hurts when she urinates. This has been occurring since she started the lasix on 10/21, which has made her urinate more frequently. The pain lasts for a few hours after each void. She denies dysuria, urinary urgency, or abdominal pain.   Assessment: UA negative for nitrites or leukocytes. Unlikely to be a UTI. Most likely a muscular strain of the abdominal side wall from her excessive coughing that is exacerbated with urination. The patient's toradol was refilled on 11/6, but she hasn't picked this up yet. I advised her that the toradol should help with the side wall pain as well.  Plan 1. Continue toradol PRN for pain

## 2017-12-05 NOTE — Assessment & Plan Note (Signed)
HPI: Patient reports a few days of pain and swelling of her upper middle gum. She has had an abscess there in the past that was treated with tooth extraction and antibiotics. She does not regularly see a dentist.   Assessment: On exam, the pocket with the missing tooth appears erythematous and swollen. Unable to appreciate any fluctuance. The abx for her sinusitis should also cover this infection. Advised the patient to return if the pain does not improve for consideration of referral to oral surgery.  Plan 1. Augmentin x7 days

## 2017-12-05 NOTE — Assessment & Plan Note (Signed)
Was last seen in [facility] on 10/21 for hospital f/u after an admission for acute bronchitis 2/2 rhinovirus and new onset acute hypoxic respiratory failure, requiring discharge home on supplemental oxygen. Her saturation in clinic today is 100% on 2L [place]. However, she reports that she still feels dyspneic and needs to turn her oxygen up to 3L when she exerts herself, for example when she walks to the mailbox. Her continued hypoxia seems incongruent with viral bronchitis. Decompensated diastolic heart failure has also been considered as the etiology of respiratory failure. She was given 20mg  IV lasix in the [facility] on 10/21 followed by PO lasix 20mg  daily. D-dimer was elevated at this visit. She underwent CTA yesterday which was negative for PE, but did show a large pulmonary artery (up to 4cm) suggestive of pulmonary arterial hypertension as well as a band of linear atelectasis in the right middle lobe without observed endobronchial lesion.   Assessment: Lungs are clear on exam. She appears fluid overloaded despite lasix therapy based on her lower extremity edema and her 9-lb weight gain since 10/21 (330 --> 339lbs). The most likely causes of pulmonary hypertension in this patient would be heart failure, intrinsic lung disease like COPD given smoking history and mild emphysematous findings on CTA, and OSA. Her most recent ECHO in 04/2017 showed normal EF with grade 1 diastolic dysfunction. Will repeat ECHO at this time given volume status and possible PAH. She has never had PFTs before. She had a sleep study in 2016 which showed 3 apneic events per hour, and thus did not meet criteria for OSA.   Plan 1. Increase lasix from 20mg  daily to 40mg  BID for 3 days. Then continue on lasix 40mg  daily. 2. Repeat BMP today 3. ECHO 4. PFTs 5. Consider a second sleep study if current workup is unrevealing

## 2017-12-05 NOTE — Progress Notes (Signed)
   CC: cough and dyspnea  HPI:   [name] is a 47 y.o. female with a medical history of HTN and hypoxic respiratory failure on supplemental oxygen (2L) as well as the other medical conditions as listed below who presents to the internal medicine clinic for continued dyspnea and new cough. Please see problem based charting for the status of the patient's current and chronic medical conditions.   Past Medical History:  Diagnosis Date  . Acid reflux   . Anemia    Iron Def  . Anorexia   . Chronic kidney disease    Nephrotic syndrome  . Colon polyp 2009  . Depression with anxiety   . Edema leg   . Fibromyalgia   . Hemorrhoids   . Hidradenitis suppurativa   . Hypertension   . IBS (irritable bowel syndrome)   . Low back pain   . Migraines   . Morbidly obese ([place])   . Neuromuscular disorder (HCC)    fibromyalgia  . Neuropathy   . Panic attacks   . Polyp of vocal cord or larynx   . Tonsil pain     Review of Systems:   Pertinent positives mentioned in HPI. Remainder of all ROS negative.   Physical Exam: Vitals:   12/05/17 0912  BP: (!) 147/90  Pulse: 77  Temp: 98.9 F (37.2 C)  TempSrc: Oral  SpO2: 100%  Weight: (!) 339 lb 9.6 oz (154 kg)  Height: 5\' 11"  (1.803 m)   Physical Exam  Constitutional: Obese. No distress.  Eyes: Pupils are equal, round, and reactive to light. EOM are normal.  HENT: Mildly erythematous oropharynx. Maxillary sinus tenderness to palpation. No cervical lymphadenopathy. Normal TM left ear; unable to visualize TM in the right ear due to cerumen. Cardiovascular: Normal rate and regular rhythm. No murmurs, rubs, or gallops. Pulmonary/Chest: Effort normal. Clear to auscultation bilaterally. No wheezes, rales, or rhonchi. Abdominal: Bowel sounds present. Soft, non-distended. TTP of RLQ that wraps around to the right CVA. Ext: Right upper lateral arm has 0.5x0.5cm round mass that is tender to palpation. No surrounding erythema or increased  warmth. Bilateral lower extremity edema that is TTP. Unable to assess pitting status due to tenderness. Skin: Warm and dry. No rashes or wounds.   Assessment & Plan:   See Encounters Tab for problem based charting.  Patient seen with Dr. Angelia Mould

## 2017-12-05 NOTE — Assessment & Plan Note (Addendum)
HPI: Lindsay [name] reports that after she was seen in clinic on 10/21, her symptoms of cough and congestion improved. However, about one week ago they returned and began to worsen. Her cough is productive of a yellow-brown sputum. She endorses fever at home to 101 (although afebrile in clinic), body aches, right ear pain that radiates down into her throat, sore throat, and sinus pain. She denies any sick contacts.  Assessment: Afebrile. Erythematous oropharynx on exam with mild maxillary sinus tenderness, but otherwise HEENT and lung exam are normal. Influenza swab today was negative. Given that the patient improved after her rhinovirus bronchitis, but then experienced worsening of symptoms, I suspect that her URI has evolved into a bacterial sinusitis. Will treat with Augmentin. As the patient has a history of yeast infections with abx, will provide her with diflucan 150mg  x2 tablets to take if she experiences symptoms of yeast infection while on or immediately after the abx.  Plan 1. Augmentin 875-125mg  BID for 7 days 2. Diflucan 150mg . Instructed patient to take 1 tablet if she experiences symptoms of yeast infection. If not resolved, can take another tablet 3 days later.

## 2017-12-05 NOTE — Assessment & Plan Note (Signed)
Patient reports swelling and pain in the right arm since she received a toradol injection on 10/21. On exam, there is a small mass under the skin at the injection site, but no erythema, increased warmth, or fluctuance. This most likely represents either a hematoma or muscular inflammation at the site of the injection and should improve with time. Low suspicion of infection.

## 2017-12-05 NOTE — Patient Instructions (Addendum)
It was a pleasure taking care of you today, Ms. Spare!  For your cough, sinus congestion, and gum pain, I have prescribed you a 7-day course of an antibiotic called augmentin. Take 1 pill twice a day.  - We also collected a flu test, and I will contact you with those results.  For your difficulty breathing and fluid accumulation - Increase your lasix from 20mg  daily to 40mg  daily. Take 40mg  twice a day for the first 3 days, and then switch to 40mg  once a day.  - I have ordered an ECHO ([name] of your heart) and pulmonary function tests (to tests your lungs) - We may consider repeating a sleep study in the near future  For your back pain with urination - We will get a urine sample today to test for infection  Please come back to clinic if your symptoms do not improve. Feel free to call the clinic at [phone] if you have any questions. Once all of the test have been done, we would like to see you back. Please make an appointment in 1 month or sooner if needed.  Thanks, Dr. Annie Paras

## 2017-12-05 NOTE — Assessment & Plan Note (Signed)
BP still uncontrolled at 147/90 today. Is currently taking losartan 25mg  and hctz 25mg . Volume overload may be contributing to HTN.  Plan 1. Increased lasix to 40mg  daily.  2. If still uncontrolled, consider increasing losartan at next visit.

## 2017-12-06 LAB — BMP8+ANION GAP
ANION GAP: 9 mmol/L — AB (ref 10.0–18.0)
BUN/Creatinine Ratio: 11 (ref 9–23)
BUN: 7 mg/dL (ref 6–24)
CALCIUM: 8.4 mg/dL — AB (ref 8.7–10.2)
CHLORIDE: 104 mmol/L (ref 96–106)
CO2: 27 mmol/L (ref 20–29)
Creatinine, Ser: 0.65 mg/dL (ref 0.57–1.00)
GFR calc Af Amer: 122 mL/min/{1.73_m2} (ref >59)
GFR calc non Af Amer: 106 mL/min/{1.73_m2} (ref >59)
GLUCOSE: 72 mg/dL (ref 65–99)
POTASSIUM: 4.5 mmol/L (ref 3.5–5.2)
Sodium: 140 mmol/L (ref 134–144)

## 2017-12-08 NOTE — Progress Notes (Signed)
Internal Medicine Clinic Attending  I saw and evaluated the patient.  I personally confirmed the key portions of the history and exam documented by Dr. Annie Paras and I reviewed pertinent patient test results.  The assessment, diagnosis, and plan were formulated together and I agree with the documentation in the resident's note.    Agree lungs are clear on auscultation, suggestion is that she may have pulmonary HTN.  Will repeat echo (reassess LV function, estimate of PA pressure) as well as obtain PFTS. If still unrevealing will need to consider V/Q for chronic VTE versus referral to cardiology for right heart cath.

## 2017-12-10 DIAGNOSIS — J9620 Acute and chronic respiratory failure, unspecified whether with hypoxia or hypercapnia: Secondary | ICD-10-CM | POA: Diagnosis not present

## 2017-12-11 DIAGNOSIS — J9620 Acute and chronic respiratory failure, unspecified whether with hypoxia or hypercapnia: Secondary | ICD-10-CM | POA: Diagnosis not present

## 2017-12-16 ENCOUNTER — Other Ambulatory Visit: Payer: Self-pay | Admitting: Neurology

## 2017-12-18 ENCOUNTER — Ambulatory Visit (INDEPENDENT_AMBULATORY_CARE_PROVIDER_SITE_OTHER): Payer: Medicare Other | Admitting: Psychiatry

## 2017-12-18 ENCOUNTER — Encounter (HOSPITAL_COMMUNITY): Payer: Self-pay | Admitting: Psychiatry

## 2017-12-18 VITALS — BP 111/82 | HR 68 | Ht 71.0 in | Wt 328.0 lb

## 2017-12-18 DIAGNOSIS — F331 Major depressive disorder, recurrent, moderate: Secondary | ICD-10-CM | POA: Diagnosis not present

## 2017-12-18 DIAGNOSIS — F411 Generalized anxiety disorder: Secondary | ICD-10-CM | POA: Diagnosis not present

## 2017-12-18 MED ORDER — HYDROXYZINE PAMOATE 50 MG PO CAPS: ORAL_CAPSULE | ORAL | 1 refills | Status: DC

## 2017-12-18 MED ORDER — CLORAZEPATE DIPOTASSIUM 15 MG PO TABS: ORAL_TABLET | ORAL | 1 refills | Status: DC

## 2017-12-18 MED ORDER — BREXPIPRAZOLE 2 MG PO TABS: 2.0000 mg | ORAL_TABLET | Freq: Every day | ORAL | 1 refills | Status: DC
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
## 2017-12-22 ENCOUNTER — Ambulatory Visit (HOSPITAL_COMMUNITY): Payer: Medicare Other | Admitting: Licensed Clinical Social Worker

## 2017-12-23 ENCOUNTER — Ambulatory Visit: Payer: Self-pay | Admitting: Gastroenterology

## 2017-12-27 ENCOUNTER — Other Ambulatory Visit: Payer: Self-pay | Admitting: Internal Medicine

## 2017-12-27 DIAGNOSIS — J9601 Acute respiratory failure with hypoxia: Secondary | ICD-10-CM

## 2017-12-29 NOTE — Telephone Encounter (Signed)
Approved 1 month refill of lasix 40 mg daily. Please have her schedule follow up with me or in [facility] this month.

## 2017-12-30 ENCOUNTER — Ambulatory Visit (HOSPITAL_COMMUNITY): Admission: RE | Admit: 2017-12-30 | Payer: Medicare Other | Source: Ambulatory Visit

## 2017-12-30 ENCOUNTER — Encounter (HOSPITAL_COMMUNITY): Payer: Self-pay

## 2018-01-09 DIAGNOSIS — J9620 Acute and chronic respiratory failure, unspecified whether with hypoxia or hypercapnia: Secondary | ICD-10-CM | POA: Diagnosis not present

## 2018-01-10 DIAGNOSIS — J9620 Acute and chronic respiratory failure, unspecified whether with hypoxia or hypercapnia: Secondary | ICD-10-CM | POA: Diagnosis not present

## 2018-01-11 ENCOUNTER — Other Ambulatory Visit: Payer: Self-pay | Admitting: Neurology

## 2018-01-12 ENCOUNTER — Other Ambulatory Visit: Payer: Self-pay

## 2018-01-12 ENCOUNTER — Encounter: Payer: Self-pay | Admitting: Internal Medicine

## 2018-01-12 ENCOUNTER — Ambulatory Visit (INDEPENDENT_AMBULATORY_CARE_PROVIDER_SITE_OTHER): Payer: Medicare Other | Admitting: Internal Medicine

## 2018-01-12 VITALS — BP 135/69 | HR 85 | Temp 99.2°F | Ht 71.0 in | Wt 342.1 lb

## 2018-01-12 DIAGNOSIS — M79604 Pain in right leg: Secondary | ICD-10-CM

## 2018-01-12 DIAGNOSIS — J9601 Acute respiratory failure with hypoxia: Secondary | ICD-10-CM

## 2018-01-12 DIAGNOSIS — Z79899 Other long term (current) drug therapy: Secondary | ICD-10-CM

## 2018-01-12 DIAGNOSIS — J9611 Chronic respiratory failure with hypoxia: Secondary | ICD-10-CM

## 2018-01-12 DIAGNOSIS — M79605 Pain in left leg: Secondary | ICD-10-CM

## 2018-01-12 DIAGNOSIS — M797 Fibromyalgia: Secondary | ICD-10-CM | POA: Diagnosis not present

## 2018-01-12 MED ORDER — PREGABALIN 100 MG PO CAPS: ORAL_CAPSULE | ORAL | 2 refills | Status: DC

## 2018-01-12 MED ORDER — FUROSEMIDE 80 MG PO TABS: 80.0000 mg | ORAL_TABLET | Freq: Two times a day (BID) | ORAL | 0 refills | Status: DC

## 2018-01-12 MED ORDER — KETOROLAC TROMETHAMINE 60 MG/2ML IM SOLN
60.0000 mg | Freq: Once | INTRAMUSCULAR | Status: AC
  Administered 2018-01-12: 60 mg via INTRAMUSCULAR

## 2018-01-12 NOTE — Progress Notes (Signed)
   CC: Hypoxia follow up  HPI:  [name] is a 47 y.o. female with past medical history outlined below here for hypoxia follow up. For the details of today's visit, please refer to the assessment and plan.  Past Medical History:  Diagnosis Date  . Acid reflux   . Anemia    Iron Def  . Anorexia   . Chronic kidney disease    Nephrotic syndrome  . Colon polyp 2009  . Depression with anxiety   . Edema leg   . Fibromyalgia   . Hemorrhoids   . Hidradenitis suppurativa   . Hypertension   . IBS (irritable bowel syndrome)   . Low back pain   . Migraines   . Morbidly obese ([place])   . Neuromuscular disorder (HCC)    fibromyalgia  . Neuropathy   . Panic attacks   . Polyp of vocal cord or larynx   . Tonsil pain     Review of Systems  Respiratory: Positive for shortness of breath.   Musculoskeletal: Positive for back pain and myalgias.    Physical Exam:  Vitals:   01/12/18 1412  BP: 135/69  Pulse: 85  Temp: 99.2 F (37.3 C)  TempSrc: Oral  SpO2: 97%  Weight: (!) 342 lb 1.6 oz (155.2 kg)  Height: 5\' 11"  (1.803 m)    Constitutional: NAD, appears comfortable, morbidly obese  Cardiovascular: RRR, no murmurs, rubs, or gallops.  Pulmonary/Chest: CTAB, no wheezes, rales, or rhonchi.  Extremities: Warm and well perfused. No edema.  Psychiatric: Normal mood and affect  Assessment & Plan:   See Encounters Tab for problem based charting.  Patient discussed with Dr. Eppie Gibson

## 2018-01-12 NOTE — Assessment & Plan Note (Addendum)
Patient is here for follow up of her now subacute / chronic hypoxia. She was initially hospitalized in October, 2019 for acute hypoxic respiratory failure attributed to viral bronchitis. Unfortunately her hypoxia has persisted and now inconsistent with this diagnosis. There has also been some concern for diastolic heart failure as she responded well to IV lasix while admitted. She had a prior echo in April of this year prior to hospitalization that demonstrated normal systolic function and grade 1 diastolic dysfunction. She was seen by me for hospital follow up on 10/21, at which point he was oxygenating well at rest but desaturated to 78% with ambulation. She was noted to have a 6.5 lb weight gain. She was given 1 x IV lasix in clinic and started on 20 mg PO daily. CTA chest was also done and negative for PE, but noted a dilated pulmonary artery of 4 cm, concerning for pulmonary artery hypertension. See was seen again for follow up on 11/8, at which point she had gained an additional 9 lbs despite lasix. PFTs and repeat echocardiogram were ordered for work up of pulmonary artery hypertension. Her lasix was increased to 40 mg BID.   Today, she is complaining of feeling lethargic and having poor appetite. She has persistent shortness of breath that is significantly worse with exertion. She is only able to ambulate a short distance before needing to rest. She currently wears 3L [place] at night and 2L with ambulation. She has not yet had her repeat echo or PFTs done. Ambulatory pulse ox today is significant for desaturation to 83% with exertion. She recovered to 93% on RA with rest. On further review of her prior echocardiogram, there is documentation of "Aneursymal interatrial septum - cannot exclude PFO". Currently the differential is most concerning for intracardiac shunt vs. Chronic PE with possible PAH. Although PA was dilated on recent CTA chest, pressure was estimated to be 18 on recent echo. Repeat echocardiogram  will be helpful to re evaluate pulmonary pressures and possible shunt etiology. If DLCO is decreased on PFTs, would pursue V/Q scan to rule out chronic PEs. Today we will increase her lasix to 80 mg BID given her on going weight gain. She is up 14 lbs in the past month. Unfortunately volume status is difficult to judge given her morbid obesity. Of note - patient did have a sleep study in 2016 that was not consistent with OSA. We have discussed repeating this study pending her echo and PFTs if other etiology cannot be identified.  -- Increase lasix 80 mg BID -- Check BMP -- Repeat Echo & PFTs; further work up pending results  -- Follow up 2 weeks [facility]

## 2018-01-12 NOTE — Progress Notes (Signed)
SATURATION QUALIFICATIONS: (This note is used to comply with regulatory documentation for home oxygen)  Patient Saturations on Room Air at Rest = 93%  Patient Saturations on Room Air while Ambulating = 83%  Patient Saturations on  Room Air Liters of oxygen while Ambulating = 95%  Rested after 1 minute no Oxygen was given   Please briefly explain why patient needs home oxygen:

## 2018-01-12 NOTE — Patient Instructions (Addendum)
Lindsay [name],  It was a pleasure to see you. I am sorry to hear you are not doing well. We still have some test we need to complete to help [name] figure out why your oxygen levels are low. The following tests have been ordered:  - Echocardiogram - Pulmonary Function testing  I have increased your lasix to 80 mg BID. I will call you with the results of your blood work. Please follow up with [name] again in 2-4 weeks.   If you have any questions or concerns, call our clinic at [phone] or after hours call [phone] and ask for the internal medicine resident on call. Thank you!  Dr. Philipp Ovens

## 2018-01-12 NOTE — Assessment & Plan Note (Signed)
Patient has chronic fibromyalgia currently in flair, with muscular pain in her neck, bilateral shoulders, and upper back. Therapy has been limited due to her multiple psychiatric medications and potential interaction. She has achieved moderate control on TID lyrica. She is currently unable to exercise due to chronic hypoxia and exertional dyspnea. She responds well to NSAIDs, in particular IM toradol. Requesting injection today.  -- Refilled lyrica 100 mg TID -- IM toradol 60 mg x 1

## 2018-01-13 LAB — BMP8+ANION GAP
ANION GAP: 14 mmol/L (ref 10.0–18.0)
BUN/Creatinine Ratio: 13 (ref 9–23)
BUN: 9 mg/dL (ref 6–24)
CO2: 21 mmol/L (ref 20–29)
CREATININE: 0.72 mg/dL (ref 0.57–1.00)
Calcium: 8.8 mg/dL (ref 8.7–10.2)
Chloride: 105 mmol/L (ref 96–106)
GFR calc Af Amer: 115 mL/min/{1.73_m2} (ref >59)
GFR calc non Af Amer: 99 mL/min/{1.73_m2} (ref >59)
Glucose: 70 mg/dL (ref 65–99)
Potassium: 4.5 mmol/L (ref 3.5–5.2)
SODIUM: 140 mmol/L (ref 134–144)

## 2018-01-14 ENCOUNTER — Other Ambulatory Visit: Payer: Self-pay | Admitting: Internal Medicine

## 2018-01-14 MED ORDER — PROMETHAZINE HCL 12.5 MG PO TABS: 12.5000 mg | ORAL_TABLET | Freq: Two times a day (BID) | ORAL | 0 refills | Status: DC | PRN

## 2018-01-14 NOTE — Progress Notes (Signed)
Patient called this evening with nausea feeling sick to her stomach.  She is not sure if it is due to sinus drainage, her dental abscess drainage or the abx prescribed for it.  She denies other systemic symptoms dizziness, fever, tachycardia, itching, skin changes etc.  Will send her a short course of phenergan and have her follow up with her dentist tomorrow about possibly switching to a different abx if necessary.

## 2018-01-15 DIAGNOSIS — J9620 Acute and chronic respiratory failure, unspecified whether with hypoxia or hypercapnia: Secondary | ICD-10-CM | POA: Diagnosis not present

## 2018-01-15 NOTE — Progress Notes (Signed)
Case discussed with Dr. Guilloud at the time of the visit. We reviewed the resident's history and exam and pertinent patient test results. I agree with the assessment, diagnosis, and plan of care documented in the resident's note. 

## 2018-01-23 ENCOUNTER — Ambulatory Visit (HOSPITAL_COMMUNITY): Admission: RE | Admit: 2018-01-23 | Payer: Medicare Other | Source: Ambulatory Visit

## 2018-01-29 ENCOUNTER — Ambulatory Visit: Payer: Self-pay

## 2018-01-29 ENCOUNTER — Other Ambulatory Visit (HOSPITAL_COMMUNITY): Payer: Self-pay | Admitting: Internal Medicine

## 2018-01-29 NOTE — Telephone Encounter (Signed)
Next appt scheduled 03/16/18 with PCP. 

## 2018-01-30 NOTE — Telephone Encounter (Signed)
Refill for albuterol declined. This was prescribed during hospitalization in October for bronchitis. She is currently being worked up for new hypoxia. Unfortunately no showed her echocardiogram two times now. Please ask patient to reschedule this. Thank you.

## 2018-01-30 NOTE — Telephone Encounter (Signed)
Per Epic, echo and PFTs have been re-scheduled for 1/13.

## 2018-02-05 ENCOUNTER — Other Ambulatory Visit: Payer: Self-pay | Admitting: Internal Medicine

## 2018-02-05 DIAGNOSIS — J9601 Acute respiratory failure with hypoxia: Secondary | ICD-10-CM

## 2018-02-06 ENCOUNTER — Telehealth: Payer: Self-pay

## 2018-02-06 NOTE — Telephone Encounter (Signed)
Patient requesting refill of her lasix. This was increased 12/16 with plan for 2 week follow up. She has an appointment scheduled with me in February. Please ask her to keep this appointment but schedule another appointment in [facility] next week. Approved 1 week of lasix.

## 2018-02-06 NOTE — Telephone Encounter (Signed)
Pt called and notified she will need to be seen in [facility] next week for evalulation for additional refills for Lasix per Dr. Rivka Safer instructions.  Pt states she has another MD appt on Monday morning and will be at [facility], appt scheduled for 02-09-18 @ 1015.    SChaplin, RN,BSN

## 2018-02-09 ENCOUNTER — Ambulatory Visit (INDEPENDENT_AMBULATORY_CARE_PROVIDER_SITE_OTHER): Payer: Medicare Other | Admitting: Internal Medicine

## 2018-02-09 ENCOUNTER — Ambulatory Visit (HOSPITAL_COMMUNITY)
Admission: RE | Admit: 2018-02-09 | Discharge: 2018-02-09 | Disposition: A | Discharge status: Home / Self Care | Payer: Medicare Other | Source: Ambulatory Visit | Attending: Internal Medicine | Admitting: Internal Medicine

## 2018-02-09 ENCOUNTER — Encounter: Payer: Self-pay | Admitting: Internal Medicine

## 2018-02-09 VITALS — BP 144/74 | HR 81 | Temp 98.9°F | Ht 71.0 in | Wt 338.3 lb

## 2018-02-09 DIAGNOSIS — J9611 Chronic respiratory failure with hypoxia: Secondary | ICD-10-CM

## 2018-02-09 DIAGNOSIS — M797 Fibromyalgia: Secondary | ICD-10-CM

## 2018-02-09 DIAGNOSIS — L732 Hidradenitis suppurativa: Secondary | ICD-10-CM

## 2018-02-09 DIAGNOSIS — R002 Palpitations: Secondary | ICD-10-CM | POA: Insufficient documentation

## 2018-02-09 DIAGNOSIS — Z79899 Other long term (current) drug therapy: Secondary | ICD-10-CM

## 2018-02-09 DIAGNOSIS — E669 Obesity, unspecified: Secondary | ICD-10-CM | POA: Diagnosis not present

## 2018-02-09 DIAGNOSIS — M79604 Pain in right leg: Secondary | ICD-10-CM

## 2018-02-09 DIAGNOSIS — G43909 Migraine, unspecified, not intractable, without status migrainosus: Secondary | ICD-10-CM

## 2018-02-09 DIAGNOSIS — F339 Major depressive disorder, recurrent, unspecified: Secondary | ICD-10-CM

## 2018-02-09 DIAGNOSIS — J9601 Acute respiratory failure with hypoxia: Secondary | ICD-10-CM | POA: Diagnosis not present

## 2018-02-09 DIAGNOSIS — Z6841 Body Mass Index (BMI) 40.0 and over, adult: Secondary | ICD-10-CM

## 2018-02-09 DIAGNOSIS — M79605 Pain in left leg: Secondary | ICD-10-CM

## 2018-02-09 DIAGNOSIS — F419 Anxiety disorder, unspecified: Secondary | ICD-10-CM

## 2018-02-09 LAB — PULMONARY FUNCTION TEST
DL/VA % pred: 60 %
DL/VA: 3.36 ml/min/mmHg/L
DLCO UNC % PRED: 40 %
DLCO unc: 13.67 ml/min/mmHg
FEF 25-75 PRE: 1.33 L/s
FEF 25-75 Post: 1.14 L/sec
FEF2575-%Change-Post: -14 %
FEF2575-%Pred-Post: 37 %
FEF2575-%Pred-Pre: 43 %
FEV1-%CHANGE-POST: -3 %
FEV1-%Pred-Post: 55 %
FEV1-%Pred-Pre: 57 %
FEV1-Post: 1.67 L
FEV1-Pre: 1.74 L
FEV1FVC-%Change-Post: 2 %
FEV1FVC-%PRED-PRE: 93 %
FEV6-%Change-Post: -6 %
FEV6-%Pred-Post: 57 %
FEV6-%Pred-Pre: 61 %
FEV6-Post: 2.13 L
FEV6-Pre: 2.28 L
FEV6FVC-%Pred-Post: 102 %
FEV6FVC-%Pred-Pre: 102 %
FVC-%Change-Post: -6 %
FVC-%Pred-Post: 56 %
FVC-%Pred-Pre: 60 %
FVC-Post: 2.13 L
FVC-Pre: 2.28 L
POST FEV1/FVC RATIO: 78 %
Post FEV6/FVC ratio: 100 %
Pre FEV1/FVC ratio: 76 %
Pre FEV6/FVC Ratio: 100 %
RV % pred: 112 %
RV: 2.35 L
TLC % PRED: 74 %
TLC: 4.55 L

## 2018-02-09 MED ORDER — KETOROLAC TROMETHAMINE 30 MG/ML IJ SOLN
15.0000 mg | Freq: Once | INTRAMUSCULAR | Status: AC
  Administered 2018-02-09: 15 mg via INTRAMUSCULAR

## 2018-02-09 MED ORDER — ALBUTEROL SULFATE (2.5 MG/3ML) 0.083% IN NEBU
2.5000 mg | INHALATION_SOLUTION | Freq: Once | RESPIRATORY_TRACT | Status: AC
  Administered 2018-02-09: 2.5 mg via RESPIRATORY_TRACT

## 2018-02-09 NOTE — Progress Notes (Signed)
   CC: chronic hypoxic respiratory failure  HPI:  [name] is a 48 y.o. with a PMH of morbid obesity, MDD with anxiety, fibromyalgia, hidradenitis suppurative, migraines, ?nephrotic syndrome, and chronic hypoxic respiratory failure presenting to clinic for follow up on her respiratory failure.  Patient endorses continued symptoms of shortness of breath with exertion which limits her activity. After increasing her lasix at last visit, she did notice increased urine output but no change in her symptoms. She ended up increasing her oxygen to 4L continuous which does help some. She has attempted checking her O2 sat at home but is not able to get an accurate and consistent value. She endorses bil LE L>R w/o pain, erythema, warmth.   Please see problem based Assessment and Plan for status of patients chronic conditions.  Past Medical History:  Diagnosis Date  . Acid reflux   . Anemia    Iron Def  . Anorexia   . Chronic kidney disease    Nephrotic syndrome  . Colon polyp 2009  . Depression with anxiety   . Edema leg   . Fibromyalgia   . Hemorrhoids   . Hidradenitis suppurativa   . Hypertension   . IBS (irritable bowel syndrome)   . Low back pain   . Migraines   . Morbidly obese ([place])   . Neuromuscular disorder (HCC)    fibromyalgia  . Neuropathy   . Panic attacks   . Polyp of vocal cord or larynx   . Tonsil pain     Review of Systems:   Endorses shortness of breath, LE swelling L>R, bil leg pain, chronic nausea, headaches.   Physical Exam:  Vitals:   02/09/18 0930  BP: (!) 144/74  Pulse: 81  Temp: 98.9 F (37.2 C)  TempSrc: Oral  SpO2: 97%  Weight: (!) 338 lb 4.8 oz (153.5 kg)  Height: 5\' 11"  (1.803 m)   GENERAL- alert, co-operative, appears as stated age, not in any distress. HEENT- oral mucosa appears moist. CARDIAC- RRR, no murmurs, rubs or gallops. RESP- Moving equal volumes of air, and clear to auscultation bilaterally, no wheezes or  crackles. EXTREMITIES- pulse 2+ PT, symmetric. Bil LE edema L (+1 pitting) >R with diffuse tenderness. Mild increased warmth over edematous areas in LEs over anterior and posterior areas.  Assessment & Plan:   See Encounters Tab for problem based charting.   Patient discussed with Dr. Gerrit Friends, MD Internal Medicine PGY-3

## 2018-02-09 NOTE — Progress Notes (Signed)
  Echocardiogram 2D Echocardiogram has been performed.  Lindsay [name] 02/09/2018, 8:27 AM

## 2018-02-09 NOTE — Patient Instructions (Signed)
We'll plan on doing a different breathing test called a VQ scan. You should get a call in the next week to schedule this.   Continue your lasix like before.   F/u with Dr. Philipp Ovens in February, however if you feel like your symptoms are changing please schedule an earlier appointment.  We'll start the process of getting you a home health aid.

## 2018-02-09 NOTE — Assessment & Plan Note (Addendum)
Patient reports symptoms of shortness of breath that began in Dec 2018; during Oct 2019 admission for bronchitis she was found to have hypoxia on ambulation and was started on oxygen therapy. The thought was her hypoxia was acute and transient in the setting of bronchitis, however it has persisted. Initially she had some improvement with Lasix.   Patient was ambulated with pulse ox: maintained oxygenation >92% at rest, desaturated to 87% with ambulation  Workup has consisted of: Echo 04/2017 (prior to finding of hypoxia): G1DD, normal PA pressures (18), trivial tricuspid regurg. Intra-atrial aneurysmal dilation CTA 11/2017: no evidence of acute PE; dilation of pulmonary artery to 4cm ?PAH; some atelectic changes to RLL w/o nodules or consolidation Sleep study 11/2014 - did not meet criteria for second half of split study but did have some apneic episodes.  PFTs 02/09/2018: moderate restriction with low DLCO, some obstruction (FEV1/FVC 93%, FVC 60%, TLC 74%, DLCO 40). Echo 02/09/2018: no mention of PA pressures but still trivial tricuspid regurg so likely w/in normal limits (though this is imperfect assessment), no mention of intraatrial septum aneurysmal dilation.  Plan: --will continue workup with a VQ scan to evaluate for chronic PE  --if negative can pursue RHC to best assess whether she does have PAH  --PCS ordered for patient per request as she is having trouble with ADLs due to her symptoms --continue lasix as previous --f/u w/in 1 month

## 2018-02-10 ENCOUNTER — Encounter: Payer: Self-pay | Admitting: Internal Medicine

## 2018-02-10 ENCOUNTER — Telehealth: Payer: Self-pay | Admitting: *Deleted

## 2018-02-10 DIAGNOSIS — J9620 Acute and chronic respiratory failure, unspecified whether with hypoxia or hypercapnia: Secondary | ICD-10-CM | POA: Diagnosis not present

## 2018-02-10 NOTE — Telephone Encounter (Signed)
Request for Independent Assessment for Personal Care Services Attestation of Medical Need given to Dr. Jari Favre to complete. Once completed will need to fax to [place] at [phone].  Hubbard Hartshorn, RN, BSN

## 2018-02-10 NOTE — Telephone Encounter (Signed)
Completed and signed Request for Independent Assessment for [place] of Medical Need faxed to [place] at [phone].  Hubbard Hartshorn, RN, BSN

## 2018-02-10 NOTE — Telephone Encounter (Signed)
Documentation completed. Thanks for your help!  [name]

## 2018-02-11 NOTE — Progress Notes (Signed)
Internal Medicine Clinic Attending  Case discussed with Dr. Svalina  at the time of the visit.  We reviewed the resident's history and exam and pertinent patient test results.  I agree with the assessment, diagnosis, and plan of care documented in the resident's note.  

## 2018-02-17 ENCOUNTER — Encounter: Payer: Self-pay | Admitting: Gastroenterology

## 2018-02-17 ENCOUNTER — Ambulatory Visit (INDEPENDENT_AMBULATORY_CARE_PROVIDER_SITE_OTHER): Payer: Medicare Other | Admitting: Gastroenterology

## 2018-02-17 VITALS — BP 132/96 | HR 82 | Ht 71.0 in | Wt 339.2 lb

## 2018-02-17 DIAGNOSIS — Z86010 Personal history of colonic polyps: Secondary | ICD-10-CM

## 2018-02-17 DIAGNOSIS — R109 Unspecified abdominal pain: Secondary | ICD-10-CM

## 2018-02-17 DIAGNOSIS — R112 Nausea with vomiting, unspecified: Secondary | ICD-10-CM

## 2018-02-17 MED ORDER — PEG 3350-KCL-NA BICARB-NACL 420 G PO SOLR: 4000.0000 mL | ORAL | 0 refills | Status: DC

## 2018-02-17 MED ORDER — PROMETHAZINE HCL 12.5 MG PO TABS: 12.5000 mg | ORAL_TABLET | Freq: Four times a day (QID) | ORAL | 11 refills | Status: DC | PRN

## 2018-02-17 NOTE — Patient Instructions (Addendum)
You will be set up for a colonoscopy at [facility] with MAC sedation for h/o precancerous polyps, generalized chronic abd pains.  New prescription for phenergan 12.36m pills, one pill bid PRN nauesa.  Disp 60 with 11 refills.  #1 side effect of cymbalta is nausea.  Thank you for entrusting [facility] with your care and [facility]  Dr JArdis Hughs

## 2018-02-17 NOTE — Progress Notes (Signed)
Review of pertinent gastrointestinal problems: 1. Adenomatous polyps in her colon;  colonoscopy in 07/2007 with one polyp that was removed and was a hyperplastic polyp; also had hemorrhoids. Colonoscopy  04/2015 Dr. Ardis Hughs (minor rectal bleeding); this was an incomplete colonoscopy 2 to BMI 50, I was only able to get to the ascending colon; 4 polyps were removed, 3 of them were adenomatous.  My office arranged barium enema but she never had that done. 2.  Dyspepsia led to upper endoscopy May 2012.  Minor gastritis was noted, biopsies showed no sign of H. Pylori.  Ultrasound June 2012 was normal.  HIDA scan June 2012 was also normal.  CT scan August 2012 was essentially normal except for "prominent stool"   HPI: This is a very pleasant 48 year old woman who was referred to me by Velna Ochs, MD  to evaluate chronic nausea, generalized abdominal pains.    She was last seen here in our office March 2017.  That was for minor rectal bleeding.  I did a colonoscopy.  See those results summarized above.  She is here today for a different problem.  She has chronic nausea for years.  Zofran has helped her in the past but it is too expensive on her insurance plan.  Phenergan 12-1/2 mg pills which she takes once or twice a day is also very helpful.  She tells me her primary care physician prefers that she be seen for continued prescription for the Phenergan.  I did an upper endoscopy for.  See those results summarized above.  She is morbidly obese.  She is on 4 L of oxygen nasal cannula chronically.  She has chronic generalized abdominal discomforts that have been worked up extensively with imaging multiple times.  Without significant findings.   I let her know that the #1 side effect of Cymbalta is nausea  Chief complaint is chronic nausea, generalized abdominal pains     Review of systems: Pertinent positive and negative review of systems were noted in the above HPI section. All other review  negative.   Past Medical History:  Diagnosis Date  . Acid reflux   . Anemia    Iron Def  . Anorexia   . Chronic kidney disease    Nephrotic syndrome  . Colon polyp 2009  . Depression with anxiety   . Edema leg   . Fibromyalgia   . Hemorrhoids   . Hidradenitis suppurativa   . Hypertension   . IBS (irritable bowel syndrome)   . Low back pain   . Migraines   . Morbidly obese ([place])   . Neuromuscular disorder (HCC)    fibromyalgia  . Neuropathy   . Panic attacks   . Polyp of vocal cord or larynx   . Tonsil pain     Past Surgical History:  Procedure Laterality Date  . AXILLARY HIDRADENITIS EXCISION    . COLONOSCOPY WITH PROPOFOL N/A 05/25/2015   Procedure: COLONOSCOPY WITH PROPOFOL;  Surgeon: Milus Banister, MD;  Location: [facility] ENDOSCOPY;  Service: Endoscopy;  Laterality: N/A;  . HEMORRHOID SURGERY     with Hidradenitis surgery   . INGUINAL HIDRADENITIS EXCISION    . TONSILLECTOMY  10/18/2010   by Dr. Wilburn Cornelia  . UPPER GASTROINTESTINAL ENDOSCOPY      Current Outpatient Medications  Medication Sig Dispense Refill  . albuterol (PROVENTIL HFA;VENTOLIN HFA) 108 (90 Base) MCG/ACT inhaler Inhale 2 puffs into the lungs every 6 (six) hours as needed for wheezing or shortness of breath. 1 Inhaler 2  .  albuterol (PROVENTIL) (2.5 MG/3ML) 0.083% nebulizer solution Take 3 mLs (2.5 mg total) by nebulization every 4 (four) hours. 75 mL 12  . Brexpiprazole (REXULTI) 2 MG TABS Take 2 mg by mouth daily. 30 tablet 1  . cholecalciferol (VITAMIN D) 1000 units tablet Take 1 tablet (1,000 Units total) by mouth daily. 30 tablet 3  . clorazepate (TRANXENE) 15 MG tablet TAKE 1 TABLET BY MOUTH EVERY DAY IN THE MORNING 30 tablet 1  . DULoxetine (CYMBALTA) 60 MG capsule TAKE 1 CAPSULE (60 MG TOTAL) BY MOUTH 2 (TWO) TIMES DAILY. 60 capsule 4  . fluticasone (FLONASE) 50 MCG/ACT nasal spray Place 2 sprays into both nostrils daily. 16 g 2  . furosemide (LASIX) 80 MG tablet TAKE 1 TABLET (80 MG TOTAL) BY  MOUTH 2 (TWO) TIMES DAILY. 14 tablet 0  . guaiFENesin (MUCINEX) 600 MG 12 hr tablet Take 600 mg by mouth 2 (two) times daily as needed for cough or to loosen phlegm.    . hydrochlorothiazide (HYDRODIURIL) 25 MG tablet Take 1 tablet (25 mg total) by mouth daily. 90 tablet 3  . hydrOXYzine (VISTARIL) 50 MG capsule Take one capsule daily as needed for anxiety 30 capsule 1  . losartan (COZAAR) 25 MG tablet TAKE 1 TABLET BY MOUTH EVERY DAY 90 tablet 1  . Multiple Vitamin (MULTIVITAMIN WITH MINERALS) TABS tablet Take 1 tablet by mouth daily.    [name] omeprazole (PRILOSEC) 40 MG capsule Take 1 capsule (40 mg total) by mouth daily. 30 capsule 11  . pregabalin (LYRICA) 100 MG capsule TAKE 1 CAPSULE BY MOUTH THREE TIMES A DAY 90 capsule 2  . promethazine (PHENERGAN) 12.5 MG tablet Take 1 tablet (12.5 mg total) by mouth every 12 (twelve) hours as needed for nausea or vomiting. 14 tablet 0  . vitamin B-12 (CYANOCOBALAMIN) 1000 MCG tablet Take 1 tablet (1,000 mcg total) by mouth daily. 30 tablet 3  . vitamin C (ASCORBIC ACID) 500 MG tablet Take 500 mg by mouth daily.     No current facility-administered medications for this visit.     Allergies as of 02/17/2018 - Review Complete 02/17/2018  Allergen Reaction Noted  . Lisinopril Cough 11/06/2015    Family History  Problem Relation Age of Onset  . Diabetes Mother   . Heart disease Mother        valve leak  . Anxiety disorder Mother   . Depression Mother   . High blood pressure Mother   . Kidney failure Mother   . Cancer Father        prostate  . Heart disease Father   . Learning disabilities Sister   . Depression Sister   . Depression Sister   . Anxiety disorder Sister   . Colon cancer Neg Hx     Social History   Socioeconomic History  . Marital status: Single    Spouse name: Not on file  . Number of children: 1  . Years of education: 38  . Highest education level: Not on file  Occupational History  . Occupation: Disabled  Social Needs   . Financial resource strain: Not on file  . Food insecurity:    Worry: Not on file    Inability: Not on file  . Transportation needs:    Medical: Not on file    Non-medical: Not on file  Tobacco Use  . Smoking status: Current Some Day Smoker    Packs/day: 0.50    Years: 25.00    Pack years: 12.50  Types: Cigarettes  . Smokeless tobacco: Never Used  . Tobacco comment: 3-4  cig per day trying to quit  Substance and Sexual Activity  . Alcohol use: No    Alcohol/week: 0.0 standard drinks    Comment: occassionally  . Drug use: No  . Sexual activity: Not Currently    Birth control/protection: Implant  Lifestyle  . Physical activity:    Days per week: Not on file    Minutes per session: Not on file  . Stress: Not on file  Relationships  . Social connections:    Talks on phone: Not on file    Gets together: Not on file    Attends religious service: Not on file    Active member of club or organization: Not on file    Attends meetings of clubs or organizations: Not on file    Relationship status: Not on file  . Intimate partner violence:    Fear of current or ex partner: Not on file    Emotionally abused: Not on file    Physically abused: Not on file    Forced sexual activity: Not on file  Other Topics Concern  . Not on file  Social History Narrative   Lives at home w/ her mother and daughter   Right-handed   Caffeine: about 3 Cokes per week     Physical Exam: BP (!) 132/96   Pulse 82   Ht 5\' 11"  (1.803 m)   Wt (!) 339 lb 4 oz (153.9 kg)   LMP 01/19/2018   BMI 47.32 kg/m  Constitutional: generally well-appearing Psychiatric: alert and oriented x3 Eyes: extraocular movements intact Mouth: oral pharynx moist, no lesions Neck: supple no lymphadenopathy Cardiovascular: heart regular rate and rhythm Lungs: clear to auscultation bilaterally Abdomen: soft, nontender, nondistended, no obvious ascites, no peritoneal signs, normal bowel sounds Extremities: no lower  extremity edema bilaterally Skin: no lesions on visible extremities   Assessment and plan: 48 y.o. female with morbid obesity, chronic nausea that is likely medicine related, personal history of precancerous colon polyps  First she is due for repeat colonoscopy for personal history of precancerous colon polyps.  We discussed the fact that I had ordered a barium enema for her in the past.  She tells me that she never had it done.  She is not sure why.  Hopefully I will be able to get all the way around with a colonoscopy now, she is due for surveillance colonoscopy in 2 or 3 months from now.  We will schedule that for her.  She understands she is at increased risk for procedure related complications given her significant comorbid conditions including massive obesity, chronic respiratory insufficiency on home oxygen 4 L daily.  Procedure will be done at the hospital with anesthesia assistance.  Second I am happy to refill her relatively low-dose Phenergan on a chronic basis.  I think her nausea is related to her morbid obesity and her Cymbalta that she takes twice a day.  The #1 side effect of Cymbalta is nausea.   Please see the "Patient Instructions" section for addition details about the plan.   Owens Loffler, MD [facility] 02/17/2018, 9:50 AM  Cc: Velna Ochs, MD

## 2018-02-18 ENCOUNTER — Ambulatory Visit (HOSPITAL_COMMUNITY): Payer: Medicare Other | Admitting: Psychiatry

## 2018-02-18 ENCOUNTER — Other Ambulatory Visit (HOSPITAL_COMMUNITY): Payer: Self-pay

## 2018-02-18 DIAGNOSIS — F331 Major depressive disorder, recurrent, moderate: Secondary | ICD-10-CM

## 2018-02-18 DIAGNOSIS — F411 Generalized anxiety disorder: Secondary | ICD-10-CM

## 2018-02-18 MED ORDER — CLORAZEPATE DIPOTASSIUM 15 MG PO TABS: ORAL_TABLET | ORAL | 0 refills | Status: DC

## 2018-02-19 ENCOUNTER — Telehealth: Payer: Self-pay

## 2018-02-19 ENCOUNTER — Other Ambulatory Visit (HOSPITAL_COMMUNITY): Payer: Self-pay | Admitting: Psychiatry

## 2018-02-19 DIAGNOSIS — J9620 Acute and chronic respiratory failure, unspecified whether with hypoxia or hypercapnia: Secondary | ICD-10-CM | POA: Diagnosis not present

## 2018-02-19 DIAGNOSIS — F411 Generalized anxiety disorder: Secondary | ICD-10-CM

## 2018-02-19 DIAGNOSIS — F331 Major depressive disorder, recurrent, moderate: Secondary | ICD-10-CM

## 2018-02-19 DIAGNOSIS — J9611 Chronic respiratory failure with hypoxia: Secondary | ICD-10-CM

## 2018-02-19 NOTE — Telephone Encounter (Signed)
Needs to speak with you about portable oxygen. Please call pt back.

## 2018-02-20 NOTE — Telephone Encounter (Signed)
Ok just let me know what I need to order. Thanks

## 2018-02-20 NOTE — Telephone Encounter (Signed)
Patient called in states per [facility] she needs new oxygen concentrator now that she is on 4 L. Message sent to Darlina Guys at [facility] to see exactly what needs to be ordered. Hubbard Hartshorn, RN, BSN

## 2018-02-23 NOTE — Telephone Encounter (Signed)
Community message sent to Jolee Ewing at [facility] that order has been placed. Hubbard Hartshorn, RN, BSN

## 2018-02-23 NOTE — Telephone Encounter (Signed)
Following [employer] received from Jolee Ewing at [facility]:  DME Other--You would just write in due to patient liter flow increasing to 4lpm she will require a 10 liter concentrator.   Hubbard Hartshorn, RN, BSN

## 2018-02-24 ENCOUNTER — Telehealth: Payer: Self-pay

## 2018-02-24 DIAGNOSIS — J9620 Acute and chronic respiratory failure, unspecified whether with hypoxia or hypercapnia: Secondary | ICD-10-CM | POA: Diagnosis not present

## 2018-02-24 NOTE — Telephone Encounter (Signed)
Received a refill request from [facility] on [address] [place] for Duloxetine 60 mg 1 tab daily.  Refill has been denied based on pt's last o/v being on 02/21/2016. Pharmacy advised to have the pt call our office to schedule f/u. # [phone] provided to pharmacy.  If patients call's regarding this refill, please schedule f/u, then refills may be given.

## 2018-02-25 ENCOUNTER — Telehealth: Payer: Self-pay | Admitting: *Deleted

## 2018-02-25 NOTE — Telephone Encounter (Signed)
Pt would cyclobenzaprine 5mg  take 1 tablet 3 times daily as needed for muscle spasms

## 2018-02-27 NOTE — Telephone Encounter (Signed)
Needs an appointment, thanks

## 2018-03-03 ENCOUNTER — Telehealth: Payer: Self-pay | Admitting: Internal Medicine

## 2018-03-03 NOTE — Telephone Encounter (Signed)
Per Epic, pt has an [facility] appt on 2/5 and with Dr Philipp Ovens on 2/17.

## 2018-03-04 ENCOUNTER — Other Ambulatory Visit: Payer: Self-pay

## 2018-03-04 ENCOUNTER — Encounter: Payer: Self-pay | Admitting: Internal Medicine

## 2018-03-04 ENCOUNTER — Ambulatory Visit (HOSPITAL_COMMUNITY)
Admission: RE | Admit: 2018-03-04 | Discharge: 2018-03-04 | Disposition: A | Discharge status: Home / Self Care | Payer: Medicare Other | Source: Ambulatory Visit | Attending: Internal Medicine | Admitting: Internal Medicine

## 2018-03-04 ENCOUNTER — Ambulatory Visit (INDEPENDENT_AMBULATORY_CARE_PROVIDER_SITE_OTHER): Payer: Medicare Other | Admitting: Internal Medicine

## 2018-03-04 VITALS — BP 154/98 | HR 95 | Temp 98.9°F | Ht 71.0 in | Wt 329.2 lb

## 2018-03-04 DIAGNOSIS — E611 Iron deficiency: Secondary | ICD-10-CM | POA: Diagnosis not present

## 2018-03-04 DIAGNOSIS — Z9981 Dependence on supplemental oxygen: Secondary | ICD-10-CM | POA: Diagnosis not present

## 2018-03-04 DIAGNOSIS — Z79899 Other long term (current) drug therapy: Secondary | ICD-10-CM

## 2018-03-04 DIAGNOSIS — J101 Influenza due to other identified influenza virus with other respiratory manifestations: Secondary | ICD-10-CM

## 2018-03-04 DIAGNOSIS — M79604 Pain in right leg: Secondary | ICD-10-CM

## 2018-03-04 DIAGNOSIS — M79605 Pain in left leg: Secondary | ICD-10-CM

## 2018-03-04 DIAGNOSIS — M797 Fibromyalgia: Secondary | ICD-10-CM

## 2018-03-04 DIAGNOSIS — R6889 Other general symptoms and signs: Secondary | ICD-10-CM

## 2018-03-04 DIAGNOSIS — J9611 Chronic respiratory failure with hypoxia: Secondary | ICD-10-CM | POA: Diagnosis not present

## 2018-03-04 DIAGNOSIS — R053 Chronic cough: Secondary | ICD-10-CM | POA: Insufficient documentation

## 2018-03-04 DIAGNOSIS — R05 Cough: Secondary | ICD-10-CM | POA: Insufficient documentation

## 2018-03-04 LAB — INFLUENZA PANEL BY PCR (TYPE A & B)
INFLAPCR: POSITIVE — AB
Influenza B By PCR: NEGATIVE

## 2018-03-04 IMAGING — DX DG CHEST 2V
2 series · 2 of 2 positions shown · non-contrast
Comparison: [DATE]

CLINICAL DATA: Cough and hypoxia over the last 4 days.

EXAM:
CHEST - 2 VIEW

[chest pa]
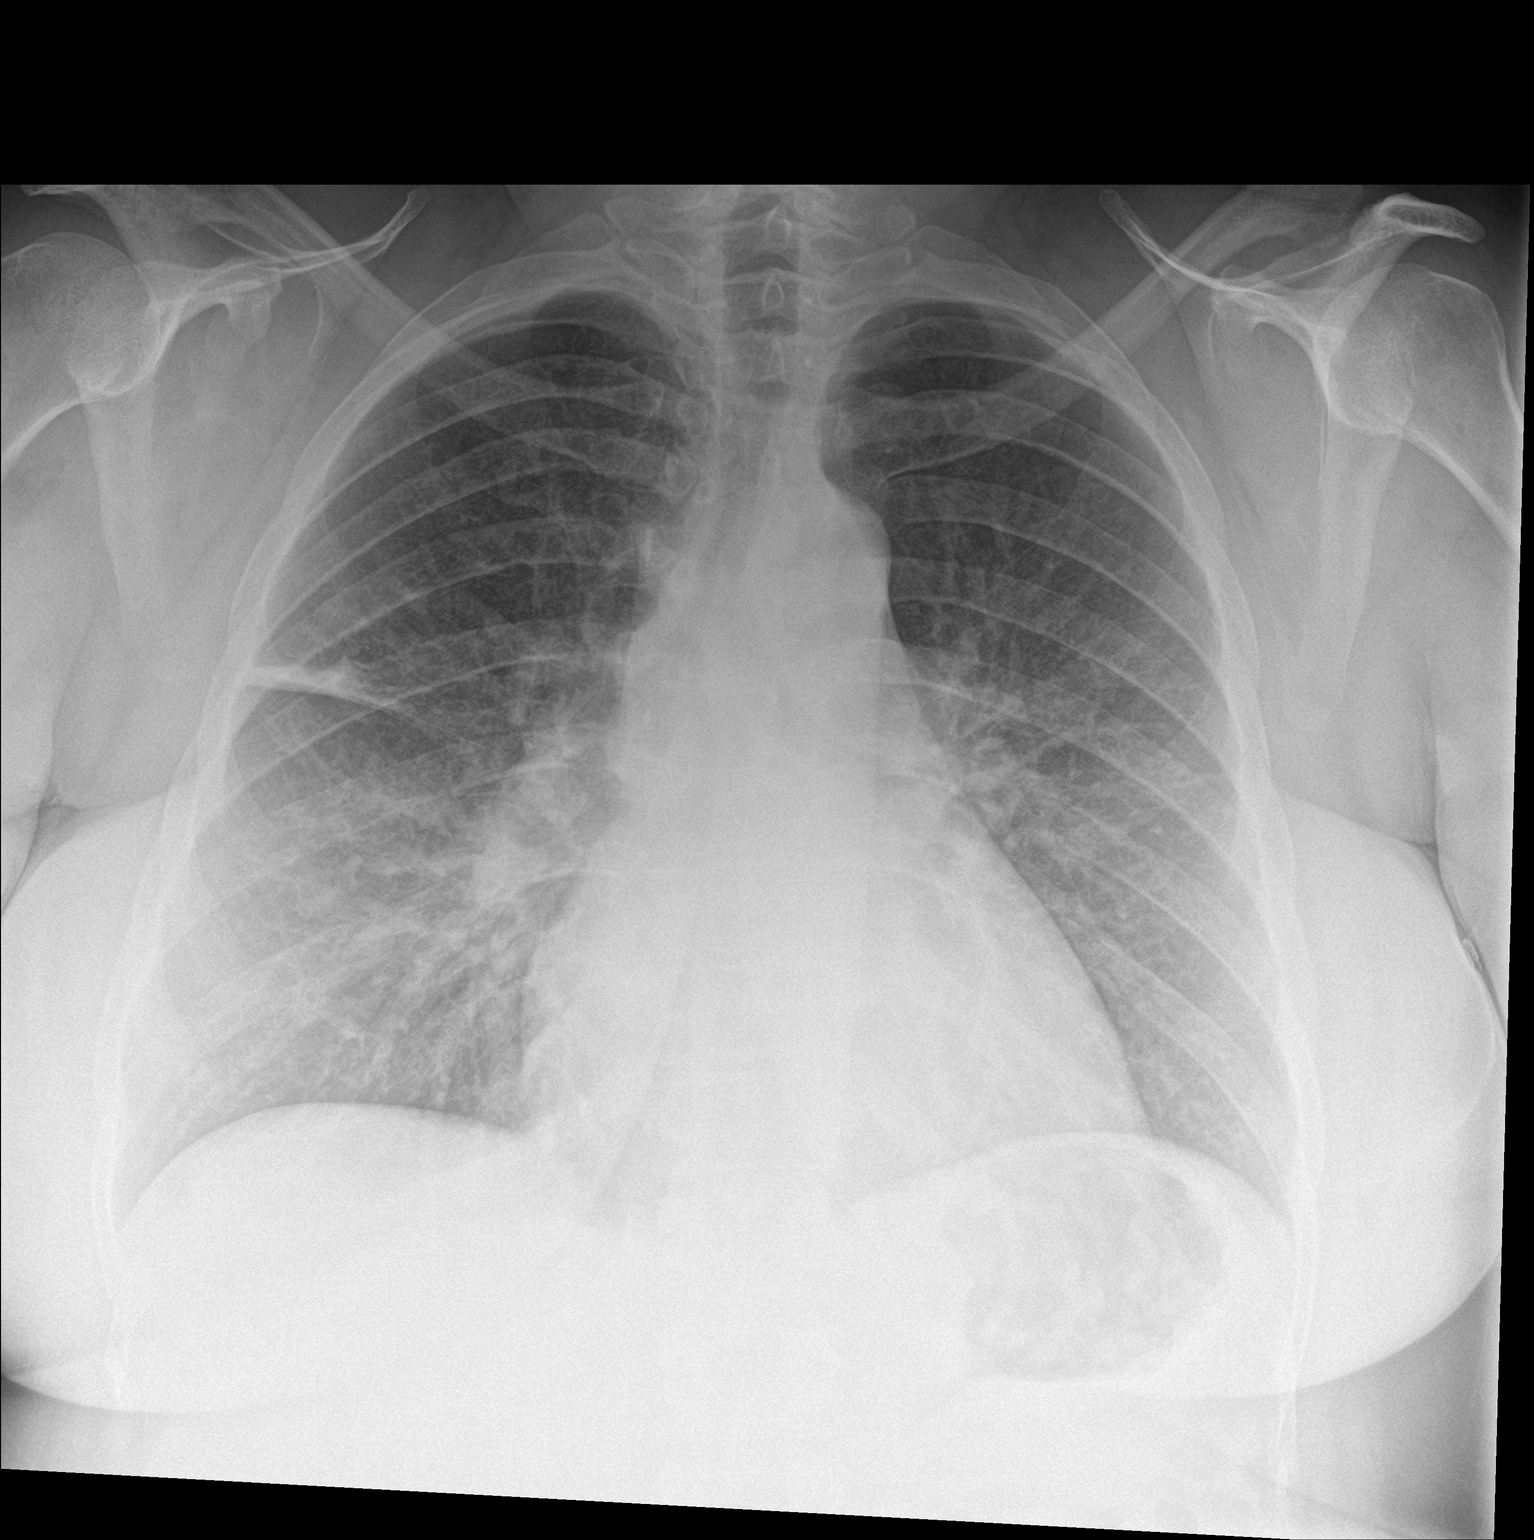

[chest lat]
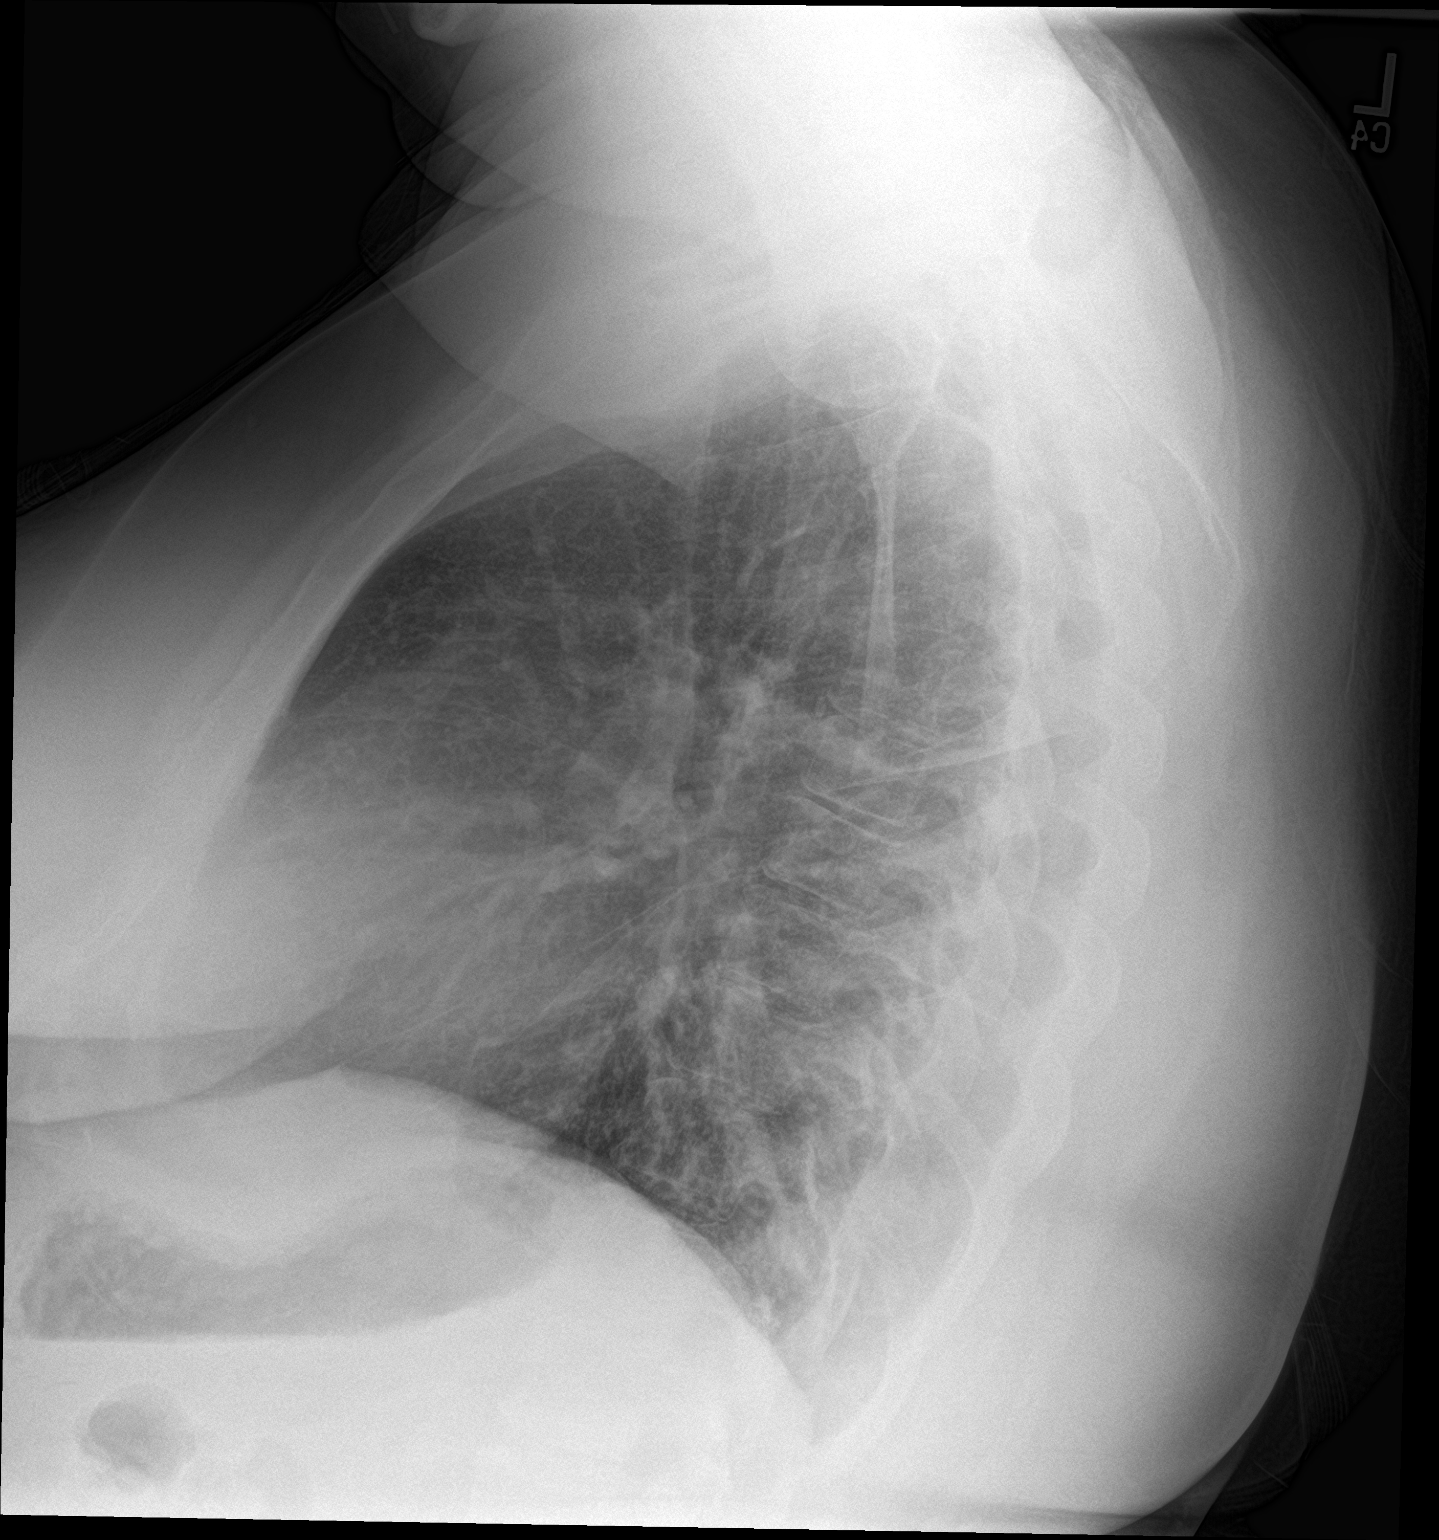

[2 of 2 positions shown; findings below may reference images not displayed]

FINDINGS: Heart size upper limits of normal. Mediastinal shadows are normal.
There chronic interstitial lung markings that appears somewhat more
accentuated, probably indicating bronchitis. There is a small amount
of fluid in the major fissure on the right. No lobar consolidation
or collapse.
IMPRESSION: Chronic interstitial markings. Probable superimposed bronchitis.
Small amount of pleural fluid on the right.

## 2018-03-04 MED ORDER — BENZONATATE 100 MG PO CAPS: 100.0000 mg | ORAL_CAPSULE | Freq: Three times a day (TID) | ORAL | 0 refills | Status: DC | PRN

## 2018-03-04 MED ORDER — PREGABALIN 100 MG PO CAPS: ORAL_CAPSULE | ORAL | 2 refills | Status: DC

## 2018-03-04 MED ORDER — DULOXETINE HCL 60 MG PO CPEP: 60.0000 mg | ORAL_CAPSULE | Freq: Two times a day (BID) | ORAL | 4 refills | Status: DC

## 2018-03-04 NOTE — Patient Instructions (Addendum)
Ms. Welge,  I am sorry to hear you have not been feeling well. For your cough and cold symptoms, you may take tessalon perles up to three times a day as needed. You may also take over the counter mucinex for congestion as needed.   I will call you with the results of your blood test and your chest x-ray. Please keep your scheduled appointment with me later this month. If you have any questions or concerns, call our clinic at [phone] or after hours call [phone] and ask for the internal medicine resident on call. Thank you!  Dr. Philipp Ovens

## 2018-03-04 NOTE — Assessment & Plan Note (Signed)
Attempted to arrange IV iron infusion previously, was not scheduled. Will follow up and re order if needed.

## 2018-03-04 NOTE — Assessment & Plan Note (Addendum)
Patient is here with complaint of cough, myalgias, fevers, nausea, and one episode of emesis ongoing for the past week. Other family members in the house have been sick with similar symptoms. She is afebrile here, hypoxic but this is chronic. On exam she has bilateral expiratory wheezing and decreased air movement which is new. Discussed possibly etiology of influenza vs. Other viral URIs and supportive treatment. Will check a CXR. Out side of window for tamiflu treatment but will check flu swab for diagnostic purposes.  -- CXR  -- Flu A/B swab  -- Tessalon perles prn -- Albuterol prn   ADDENDUM: Flu A positive, CXR without definite consolidation. Possible early development of right upper lobe infiltrate. Will hold off on antibiotics for now, continue supportive care. No tamiflu because outside of treatment window. Low threshold to treat for superimposed bacterial pneumonia if she does not improve or symptoms worsen.

## 2018-03-04 NOTE — Addendum Note (Signed)
Addended by: Jodean Lima on: 03/04/2018 03:54 PM   Modules accepted: Orders

## 2018-03-04 NOTE — Assessment & Plan Note (Signed)
Patient has developed chronic hypoxia after hospitalization in October for viral bronchitis that is currently being worked up. Please see prior office visit notes. Patient is now requiring 4L [place] at home. She desaturated to 84% during ambulatory pulse ox today in clinic, improved to low 90s with rest and supplemental oxygen.    Work up thus far has consisted of: -- 12/14/2014 Sleep Study: did not meet criteria for OSA or second half of split study but did have some apneic episodes.  -- 05/19/2017  ECHO (prior to hospitalization & hypoxia): EF 55-60%, G1DD, normal PA pressures (18), Intra-atrial aneurysmal dilation, ?PFO -- 10/2017 Hospitalized for viral bronchitis; persistent hypoxia on discharge -- 11/17/2017 Elevated D-dimer -- 12/04/2017 CTA Chest: no evidence of acute PE; dilation of pulmonary artery to 4cm ?PAH; some atelectic changes to RLL w/o nodules or consolidation -- 02/09/2018 PFTs: No obstruction, mild restriction, severe diffusion defect with low DLCO (FEV1/FVC 93%, FEV1, 57, TLC 74%, DLCO 40) -- 02/09/2018 Repeat ECHO: EF 55-60%, mild right atrial dilation, no mention of PA pressures or diastolic function, no mention of intraatrial septum aneurysmal dilation.  Assessment:  Etiology at this point is concerning for PAH (dilated on CT chest but estimated normal pressures on ehco), Chronic PEs, or intracardiac shunt. V/Q scan has been ordered but not yet scheduled. If V/Q scan is negative, may need right heart cath and / or bubbles study for further work up. Of note, she did have some improvement in her hypoxia initially with diuresis. It is possible there is a component of diastolic heart failure contributing, however hypoxia has now persisted despite adequate diuresis. She is currently taking 80 mg lasix BID and weight is down 10 lbs since her last visit.   Plan: -- Continue Supplemental O2 -- V/Q scan, further work up pending results  -- Continue lasix 80 mg BID for now; checking BMP

## 2018-03-04 NOTE — Progress Notes (Signed)
   CC: Cough, shortness of breath   HPI:  [name] is a 48 y.o. female with past medical history outlined below here for cough and shortness of breath. For the details of today's visit, please refer to the assessment and plan.  Past Medical History:  Diagnosis Date  . Acid reflux   . Anemia    Iron Def  . Anorexia   . Chronic kidney disease    Nephrotic syndrome  . Colon polyp 2009  . Depression with anxiety   . Edema leg   . Fibromyalgia   . Hemorrhoids   . Hidradenitis suppurativa   . Hypertension   . IBS (irritable bowel syndrome)   . Low back pain   . Migraines   . Morbidly obese ([place])   . Neuromuscular disorder (HCC)    fibromyalgia  . Neuropathy   . Panic attacks   . Polyp of vocal cord or larynx   . Tonsil pain     Review of Systems  Respiratory: Positive for cough, shortness of breath and wheezing.   Gastrointestinal: Positive for nausea and vomiting.  Musculoskeletal: Positive for myalgias.    Physical Exam:  Vitals:   03/04/18 0837  BP: (!) 154/98  Pulse: 95  Temp: 98.9 F (37.2 C)  TempSrc: Oral  SpO2: 95%  Weight: (!) 329 lb 3.2 oz (149.3 kg)  Height: 5\' 11"  (1.803 m)    Constitutional: NAD, appears comfortable Cardiovascular: RRR, no m/r/g Pulmonary/Chest: Bilateral expiratory wheezing, no rales or rhonchi  Extremities: Warm and well perfused. No edema.  Psychiatric: Normal mood and affect  Assessment & Plan:   See Encounters Tab for problem based charting.  Patient discussed with Dr. Lynnae January

## 2018-03-04 NOTE — Progress Notes (Signed)
Internal Medicine Clinic Attending  Case discussed with Dr. Guilloud at the time of the visit.  We reviewed the resident's history and exam and pertinent patient test results.  I agree with the assessment, diagnosis, and plan of care documented in the resident's note.  

## 2018-03-05 LAB — BMP8+ANION GAP
Anion Gap: 20 mmol/L — ABNORMAL HIGH (ref 10.0–18.0)
BUN/Creatinine Ratio: 9 (ref 9–23)
BUN: 6 mg/dL (ref 6–24)
CHLORIDE: 106 mmol/L (ref 96–106)
CO2: 19 mmol/L — ABNORMAL LOW (ref 20–29)
Calcium: 9.1 mg/dL (ref 8.7–10.2)
Creatinine, Ser: 0.68 mg/dL (ref 0.57–1.00)
GFR calc Af Amer: 120 mL/min/{1.73_m2} (ref >59)
GFR calc non Af Amer: 104 mL/min/{1.73_m2} (ref >59)
GLUCOSE: 109 mg/dL — AB (ref 65–99)
Potassium: 4 mmol/L (ref 3.5–5.2)
Sodium: 145 mmol/L — ABNORMAL HIGH (ref 134–144)

## 2018-03-06 ENCOUNTER — Other Ambulatory Visit (HOSPITAL_COMMUNITY): Payer: Self-pay | Admitting: Psychiatry

## 2018-03-06 DIAGNOSIS — F331 Major depressive disorder, recurrent, moderate: Secondary | ICD-10-CM

## 2018-03-06 DIAGNOSIS — F411 Generalized anxiety disorder: Secondary | ICD-10-CM

## 2018-03-09 ENCOUNTER — Other Ambulatory Visit: Payer: Self-pay | Admitting: Internal Medicine

## 2018-03-09 DIAGNOSIS — J9611 Chronic respiratory failure with hypoxia: Secondary | ICD-10-CM

## 2018-03-16 ENCOUNTER — Encounter: Payer: Self-pay | Admitting: Internal Medicine

## 2018-03-16 ENCOUNTER — Telehealth: Payer: Self-pay

## 2018-03-16 ENCOUNTER — Other Ambulatory Visit: Payer: Self-pay | Admitting: Internal Medicine

## 2018-03-16 ENCOUNTER — Telehealth: Payer: Self-pay | Admitting: Internal Medicine

## 2018-03-16 ENCOUNTER — Telehealth (HOSPITAL_COMMUNITY): Payer: Self-pay

## 2018-03-16 DIAGNOSIS — F331 Major depressive disorder, recurrent, moderate: Secondary | ICD-10-CM

## 2018-03-16 DIAGNOSIS — E611 Iron deficiency: Secondary | ICD-10-CM

## 2018-03-16 DIAGNOSIS — F411 Generalized anxiety disorder: Secondary | ICD-10-CM

## 2018-03-16 DIAGNOSIS — J189 Pneumonia, unspecified organism: Secondary | ICD-10-CM

## 2018-03-16 MED ORDER — CEFDINIR 300 MG PO CAPS: 300.0000 mg | ORAL_CAPSULE | Freq: Two times a day (BID) | ORAL | 0 refills | Status: DC

## 2018-03-16 NOTE — Telephone Encounter (Signed)
Patient called and reported that she has been very sick, she had the Flu and respiratory failure (see chart) - she is supposed to come in on 2/20 , but is concerned about her illness. I advised patient that it would be up to you and that I would call her back tomorrow. Patient states that she has not been seen since November and is concerned she will not get refills. I advised patient that I would discuss with you and call her back tomorrow.

## 2018-03-16 NOTE — Telephone Encounter (Addendum)
NM pulmonary perf and vent is approved by the insurance from 03/16/2018-07/30/2018. The authorization number is [id number]

## 2018-03-16 NOTE — Telephone Encounter (Signed)
Patient was called to reschedule appointment. V/Q scan has not yet been done due to pending insurance approval. She reports persistent cough and fevers with worsening mucous production. Given her recent CXR with possible early consolidation and recent influenza infection, will cover empirically for superimposed bacterial PNA. Called in [facility] [address]

## 2018-03-17 MED ORDER — FERUMOXYTOL INJECTION 510 MG/17 ML: 510.0000 mg | Freq: Once | INTRAVENOUS | 0 refills | Status: DC

## 2018-03-17 NOTE — Progress Notes (Signed)
Orders placed for IV feraheme 510 mg x 2. Second dose to be given 1 week after first dose.

## 2018-03-17 NOTE — Telephone Encounter (Signed)
We can provide 30-day supply until she get better and seen in the office.

## 2018-03-18 ENCOUNTER — Ambulatory Visit (INDEPENDENT_AMBULATORY_CARE_PROVIDER_SITE_OTHER): Payer: Medicare Other | Admitting: Internal Medicine

## 2018-03-18 ENCOUNTER — Ambulatory Visit (HOSPITAL_COMMUNITY)
Admission: RE | Admit: 2018-03-18 | Discharge: 2018-03-18 | Disposition: A | Discharge status: Home / Self Care | Payer: Medicare Other | Source: Ambulatory Visit | Attending: Internal Medicine | Admitting: Internal Medicine

## 2018-03-18 ENCOUNTER — Encounter: Payer: Self-pay | Admitting: Internal Medicine

## 2018-03-18 ENCOUNTER — Other Ambulatory Visit: Payer: Self-pay

## 2018-03-18 VITALS — BP 163/93 | HR 96 | Temp 98.6°F | Ht 71.0 in | Wt 341.1 lb

## 2018-03-18 DIAGNOSIS — J9611 Chronic respiratory failure with hypoxia: Secondary | ICD-10-CM

## 2018-03-18 DIAGNOSIS — Z9981 Dependence on supplemental oxygen: Secondary | ICD-10-CM

## 2018-03-18 DIAGNOSIS — R05 Cough: Secondary | ICD-10-CM

## 2018-03-18 DIAGNOSIS — Z792 Long term (current) use of antibiotics: Secondary | ICD-10-CM

## 2018-03-18 DIAGNOSIS — R0602 Shortness of breath: Secondary | ICD-10-CM | POA: Diagnosis not present

## 2018-03-18 DIAGNOSIS — R058 Other specified cough: Secondary | ICD-10-CM

## 2018-03-18 DIAGNOSIS — Z8709 Personal history of other diseases of the respiratory system: Secondary | ICD-10-CM

## 2018-03-18 DIAGNOSIS — G43909 Migraine, unspecified, not intractable, without status migrainosus: Secondary | ICD-10-CM

## 2018-03-18 IMAGING — DX DG CHEST 2V
2 series · 2 of 2 positions shown · non-contrast
Comparison: [DATE]

CLINICAL DATA: Shortness of breath

EXAM:
CHEST - 2 VIEW

[chest pa]
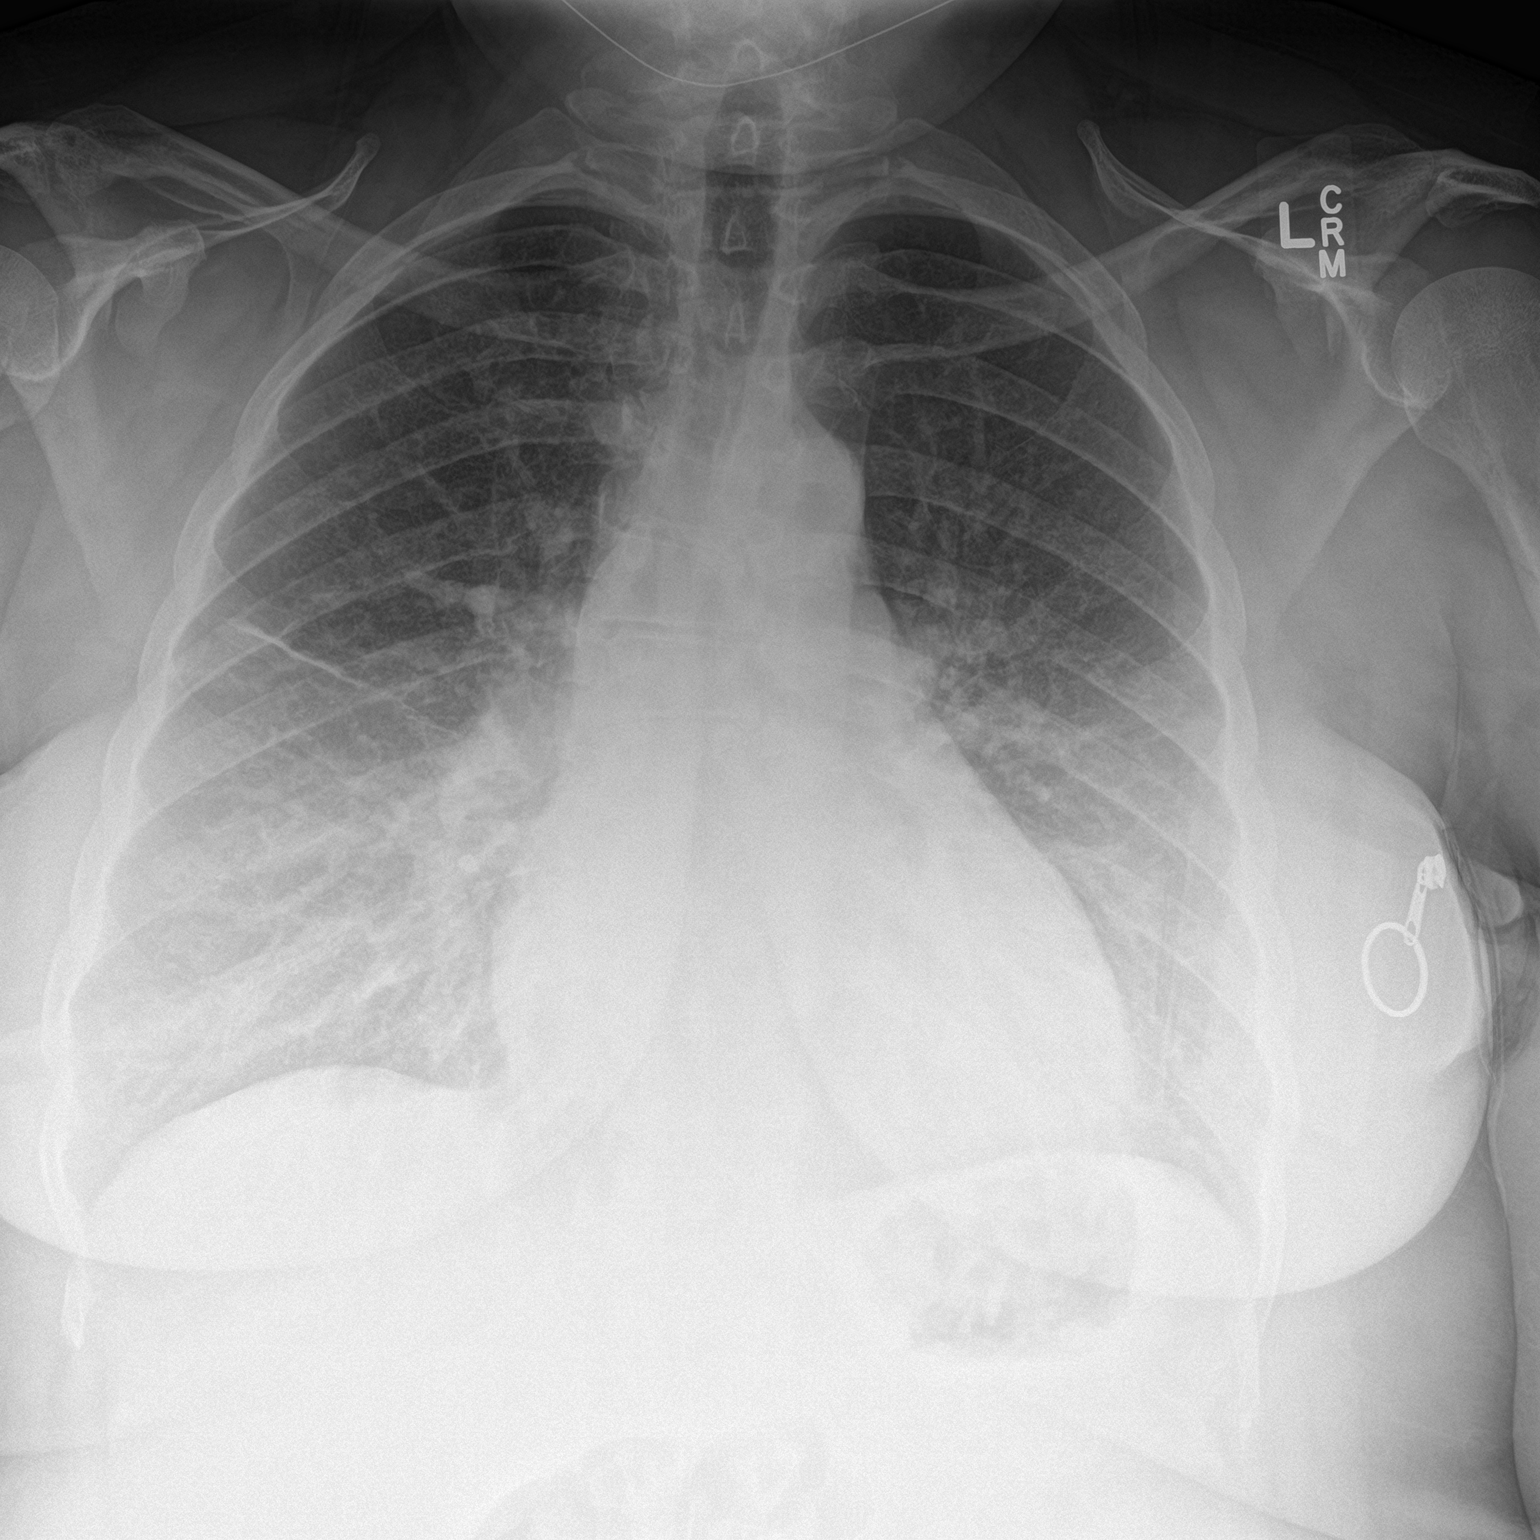

[chest lat]
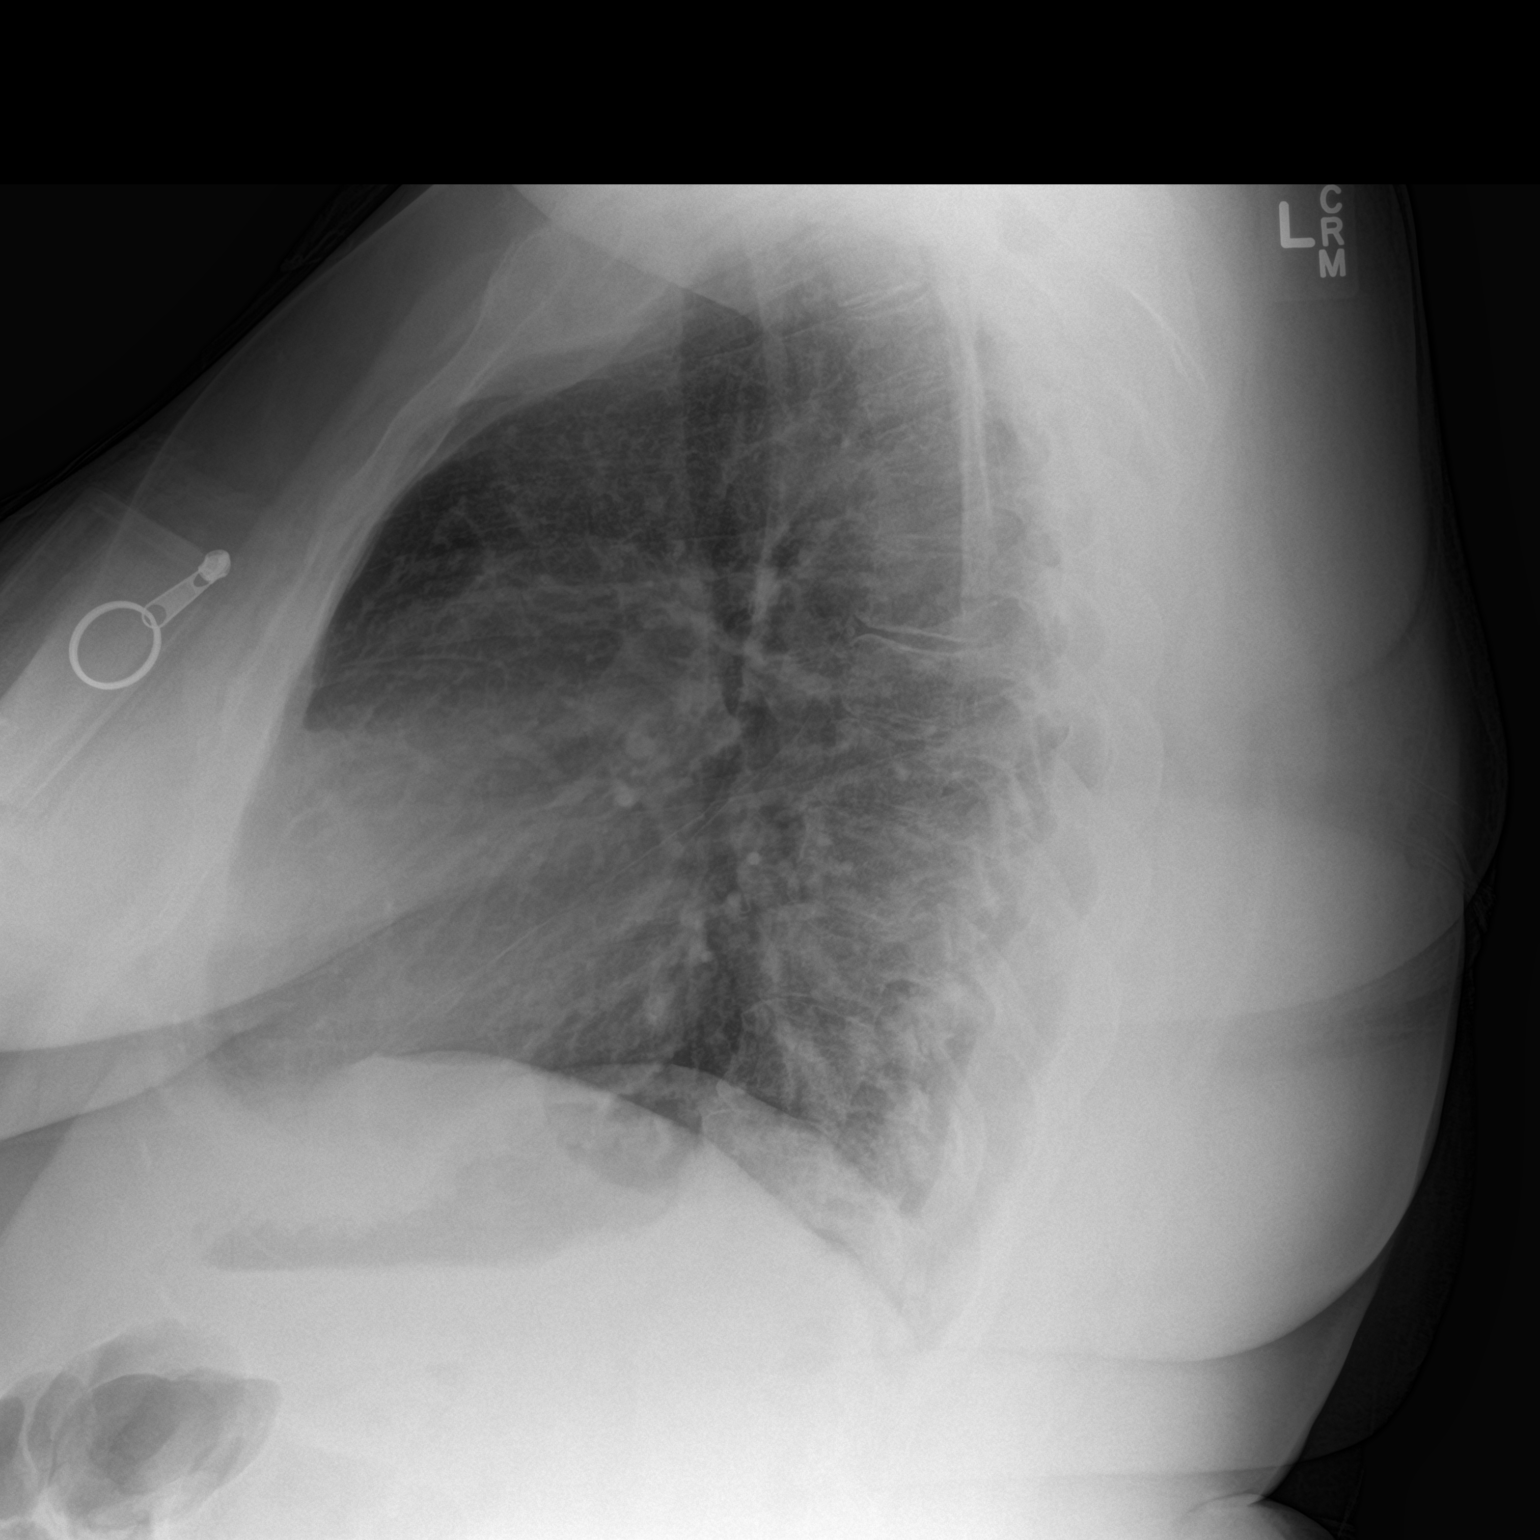

[2 of 2 positions shown; findings below may reference images not displayed]

FINDINGS: There is atelectatic change in the right lung. There is no edema or
consolidation. Heart is upper normal in size with pulmonary
vascularity normal. No adenopathy. No bone lesions.
IMPRESSION: Atelectatic change right mid lung. No edema or consolidation. Heart
upper normal in size.

## 2018-03-18 IMAGING — NM NM PULMONARY VENT & PERF
15 series · 15 of 15 positions shown · non-contrast
Comparison: None chest radiograph [DATE]

CLINICAL DATA: Hypoxia and shortness of breath

EXAM:
NUCLEAR MEDICINE VENTILATION - PERFUSION LUNG SCAN
VIEWS:
Anterior, posterior, left lateral, right lateral, RPO, LPO, RAO,
LAO-ventilation perfusion
RADIOPHARMACEUTICALS:  31.4 mCi of [E4] DTPA aerosol inhalation
and 4.3 mCi [E4] MAA IV

[Series 1: ant/post vent · 4.14mm/px · 1 of 1 slices shown]
[im 1/1]
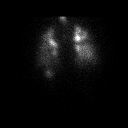

[Series 2: lao/rpo vent · 4.14mm/px · 1 of 1 slices shown (1 of 2)]
[im 1/1]
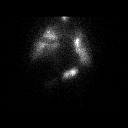

[Series 2: lao/rpo vent · 4.14mm/px · 1 of 1 slices shown (2 of 2)]
[im 1/1]
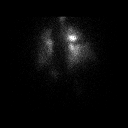

[Series 3: lpo/rao vent · 4.14mm/px · 1 of 1 slices shown (1 of 2)]
[im 1/1]
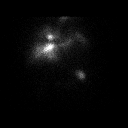

[Series 3: lpo/rao vent · 4.14mm/px · 1 of 1 slices shown (2 of 2)]
[im 1/1]
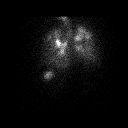

[Series 4: lt lat/rt lat vent · 4.14mm/px · 1 of 1 slices shown (1 of 2)]
[im 1/1]
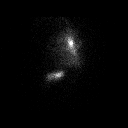

[Series 4: lt lat/rt lat vent · 4.14mm/px · 1 of 1 slices shown (2 of 2)]
[im 1/1]
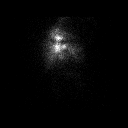

[Series 5: lt lat/rt lat perf · 4.14mm/px · 1 of 1 slices shown (1 of 2)]
[im 1/1]
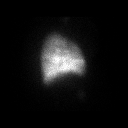

[Series 5: lt lat/rt lat perf · 4.14mm/px · 1 of 1 slices shown (2 of 2)]
[im 1/1]
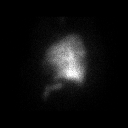

[Series 6: lpo/rao perf · 4.14mm/px · 1 of 1 slices shown (1 of 2)]
[im 1/1]
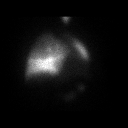

[Series 6: lpo/rao perf · 4.14mm/px · 1 of 1 slices shown (2 of 2)]
[im 1/1]
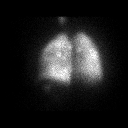

[Series 7: ant/post perf · 4.14mm/px · 1 of 1 slices shown (1 of 2)]
[im 1/1]
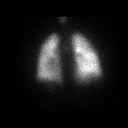

[Series 7: ant/post perf · 4.14mm/px · 1 of 1 slices shown (2 of 2)]
[im 1/1]
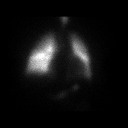

[Series 8: lao/rpo perf · 4.14mm/px · 1 of 1 slices shown (1 of 2)]
[im 1/1]
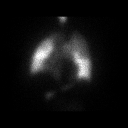

[Series 8: lao/rpo perf · 4.14mm/px · 1 of 1 slices shown (2 of 2)]
[im 1/1]
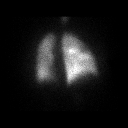

[15 of 15 positions shown; findings below may reference images not displayed]

FINDINGS: Ventilation: Radiotracer uptake is homogeneous and symmetric
bilaterally. Relative photopenia along the minor fissure on the
right is not felt to be indicative of pulmonary emboli. No segmental
type photopenic defects are evident.

Perfusion: Radiotracer uptake is homogeneous and symmetric
bilaterally. No perfusion defects are evident.
IMPRESSION: No perfusion defects evident. Ventilation study appears
unremarkable. Decreased uptake on the ventilation study in the
region of the right minor fissure is not felt to have clinical
significance. This study constitutes a very low probability of
pulmonary embolus.

## 2018-03-18 MED ORDER — KETOROLAC TROMETHAMINE 30 MG/ML IJ SOLN
60.0000 mg | Freq: Once | INTRAMUSCULAR | Status: AC
  Administered 2018-03-18: 60 mg via INTRAMUSCULAR

## 2018-03-18 MED ORDER — TECHNETIUM TO 99M ALBUMIN AGGREGATED
4.3000 | Freq: Once | INTRAVENOUS | Status: AC | PRN
  Administered 2018-03-18: 4.3 via INTRAVENOUS

## 2018-03-18 MED ORDER — BENZONATATE 100 MG PO CAPS: 100.0000 mg | ORAL_CAPSULE | Freq: Four times a day (QID) | ORAL | 1 refills | Status: DC | PRN

## 2018-03-18 MED ORDER — NAPROXEN 500 MG PO TABS: 500.0000 mg | ORAL_TABLET | Freq: Two times a day (BID) | ORAL | 0 refills | Status: DC | PRN

## 2018-03-18 MED ORDER — TECHNETIUM TC 99M DIETHYLENETRIAME-PENTAACETIC ACID
31.4000 | Freq: Once | INTRAVENOUS | Status: AC | PRN
  Administered 2018-03-18: 31.4 via RESPIRATORY_TRACT

## 2018-03-18 NOTE — Assessment & Plan Note (Addendum)
Patient has developed chronic hypoxia following hospitalization in October for viral bronchitis that is currently being worked up.  Please see prior office visit notes.  She underwent VQ scan today and radiology read is currently pending.  She is still requiring 4 L of nasal cannula with exertion.  She is persistently hypoxic with ambulation to the 80s today, improved to the low 90s with rest.  Prior work-up as listed below.   Work up thus far has consisted of: -- 12/14/2014 Sleep Study: did not meet criteria for OSA or second half of split study but did have some apneic episodes.  -- 05/19/2017  ECHO (prior to hospitalization & hypoxia): EF 55-60%, G1DD, normal PA pressures (18), Intra-atrial aneurysmal dilation, ?PFO -- 10/2017 Hospitalized for viral bronchitis; persistent hypoxia on discharge -- 11/17/2017 Elevated D-dimer -- 12/04/2017 CTA Chest: no evidence of acute PE; dilation of pulmonary artery to 4cm ?PAH; some atelectic changes to RLL w/o nodules or consolidation -- 02/09/2018 PFTs: No obstruction, mild restriction, severe diffusion defect with low DLCO (FEV1/FVC 93%, FEV1, 57, TLC 74%, DLCO 40) -- 02/09/2018 Repeat ECHO: EF 55-60%, mild right atrial dilation, no mention of PA pressures or diastolic function, no mention of intraatrial septum aneurysmal dilation.  PLAN: --Continue supplemental oxygen -Follow-up VQ scan --Will likely need cardiology referral for pulmonary hypertension and RHC  ADDENDUM: VQ scan with no perfusion defects and normal ventilation study. Overall puzzling clinical picture. Unclear reason for persistent hypoxia. Work up thus far significant only for a dilated pulmonary artery on CTA chest but normal estimated pressure on two echocardiograms as well as PFTs with isolated decreased DLCO. Will refer to cardiology for evaluation for possible RHC to work up pulmonary artery hypertension. May also need bubbles study to rule out intrapulmonary shunt.

## 2018-03-18 NOTE — Progress Notes (Signed)
Medicine attending: Medical history, presenting problems, physical findings, and medications, reviewed with resident physician Dr Carolyn Guilioud on the day of the patient visit and I concur with her evaluation and management plan. 

## 2018-03-18 NOTE — Assessment & Plan Note (Signed)
Patient is complaining of a headache typical of her normal migraine.  She has not taken anything for this yet.  Normally responds well to Toradol. -IM Toradol 601 in clinic -Naproxen 500 twice daily as needed for headaches

## 2018-03-18 NOTE — Patient Instructions (Addendum)
Lindsay [name],  It was a pleasure to see you. I am sorry you are not feeling well. I sent a prescription for tessalon perels to your pharmacy. I have given you a prescription for naproxen, you may take this twice a day with food as needed for your headaches. We have given you a toradol shot today.   Follow up with me again in 3 months or sooner if you have any issues. If you have any questions or concerns, call our clinic at [phone] or after hours call [phone] and ask for the internal medicine resident on call. Thank you!  Dr. Philipp Ovens

## 2018-03-18 NOTE — Assessment & Plan Note (Signed)
Patient is here for persistent cough following influenza infection. Symptoms have been ongoing now for over 2 weeks.  She initially presented outside of the window for Tamiflu and was treated supportively.  Unfortunately her cough and symptoms continue to worsen.  She was covered empirically for superimposed bacterial infection given questionable early infiltrate on chest x-ray.  She is currently taking cefdinir 300 mg twice a day (day 3 of 7).  She continues to have productive cough, feels that her shortness of breath may be slightly improved.  Advised patient to complete antibiotic course. --Continue cefdinir to complete course -Refilled Tessalon Perles

## 2018-03-18 NOTE — Progress Notes (Signed)
   CC: cough  HPI:  [name] is a 48 y.o. female with past medical history outlined below here for cough. For the details of today's visit, please refer to the assessment and plan.  Past Medical History:  Diagnosis Date  . Acid reflux   . Anemia    Iron Def  . Anorexia   . Chronic kidney disease    Nephrotic syndrome  . Colon polyp 2009  . Depression with anxiety   . Edema leg   . Fibromyalgia   . Hemorrhoids   . Hidradenitis suppurativa   . Hypertension   . IBS (irritable bowel syndrome)   . Low back pain   . Migraines   . Morbidly obese ([place])   . Neuromuscular disorder (HCC)    fibromyalgia  . Neuropathy   . Panic attacks   . Polyp of vocal cord or larynx   . Tonsil pain     Review of Systems  Respiratory: Positive for cough, sputum production and shortness of breath.   Neurological: Positive for headaches.    Physical Exam:  Vitals:   03/18/18 1400  BP: (!) 163/93  Pulse: 96  Temp: 98.6 F (37 C)  TempSrc: Oral  SpO2: 91%  Weight: (!) 341 lb 1.6 oz (154.7 kg)  Height: 5\' 11"  (1.803 m)    Constitutional: NAD, appears comfortable HEENT: left tympanic membrane erythematous, right tympanic membrane appears normal  Cardiovascular: RRR, no m/r/g Pulmonary/Chest: CTAB, no wheezes, rales, or rhonchi.  Extremities: Warm and well perfused. No edema.  Psychiatric: Normal mood and affect  Assessment & Plan:   See Encounters Tab for problem based charting.  Patient discussed with Dr. Beryle Beams

## 2018-03-19 ENCOUNTER — Ambulatory Visit (HOSPITAL_COMMUNITY): Payer: Medicare Other | Admitting: Psychiatry

## 2018-03-19 NOTE — Addendum Note (Signed)
Addended by: Jodean Lima on: 03/19/2018 09:54 AM   Modules accepted: Orders

## 2018-03-21 MED ORDER — CLORAZEPATE DIPOTASSIUM 15 MG PO TABS: ORAL_TABLET | ORAL | 0 refills | Status: DC

## 2018-03-21 MED ORDER — BREXPIPRAZOLE 2 MG PO TABS: 2.0000 mg | ORAL_TABLET | Freq: Every day | ORAL | 0 refills | Status: DC

## 2018-03-21 MED ORDER — HYDROXYZINE PAMOATE 50 MG PO CAPS: ORAL_CAPSULE | ORAL | 0 refills | Status: DC

## 2018-03-21 NOTE — Telephone Encounter (Signed)
30 day orders sent to the pharmacy

## 2018-03-23 ENCOUNTER — Telehealth: Payer: Self-pay | Admitting: *Deleted

## 2018-03-23 ENCOUNTER — Telehealth: Payer: Self-pay | Admitting: Internal Medicine

## 2018-03-23 DIAGNOSIS — M797 Fibromyalgia: Secondary | ICD-10-CM

## 2018-03-23 DIAGNOSIS — M79605 Pain in left leg: Secondary | ICD-10-CM

## 2018-03-23 DIAGNOSIS — M79604 Pain in right leg: Secondary | ICD-10-CM

## 2018-03-23 NOTE — Telephone Encounter (Signed)
Pt needs a prior approval on medicine ;pt contact [phone]

## 2018-03-23 NOTE — Telephone Encounter (Signed)
We discussed this at her visit. Her insurance does not cover toradol so I sent naproxen instead. Please tell her to try the naproxen first. Thanks!

## 2018-03-23 NOTE — Telephone Encounter (Signed)
Addressed in new encounter.

## 2018-03-23 NOTE — Telephone Encounter (Signed)
Would like a refill for toradol, last script was done in last year, she never filled, pharm has now explained she needs a PA. She would like a refill and PA

## 2018-03-24 ENCOUNTER — Telehealth: Payer: Self-pay | Admitting: Gastroenterology

## 2018-03-24 ENCOUNTER — Telehealth: Payer: Self-pay | Admitting: *Deleted

## 2018-03-24 NOTE — Telephone Encounter (Signed)
Call to patient to ask which medication a PA was needed.   Patient stated Toradol.  Message was addressed on yesterday by Dt.  Guilloud who wants patient to try Naproxen first.  Patient stated that she has already tried the Naproxen and this does nor helpthe pain in her knee.  Message to be sent to Dr. Philipp Ovens to order another med for pain.  Sander Nephew, RN 03/24/2018 11:34 AM.

## 2018-03-24 NOTE — Telephone Encounter (Signed)
Pt called in and stated that she lost her prescription that was giving to her by the doctor and if one can be sent into the drug store for her to pick up .

## 2018-03-24 NOTE — Telephone Encounter (Signed)
Spoke to patient. Informed her that her prescription was sent to her pharmacy at her last office visit. She also said she lost her procedure instruction for colonoscopy. Instruction printed out and mailed to patient. She will call if she has any questions

## 2018-03-25 ENCOUNTER — Telehealth: Payer: Self-pay | Admitting: Gastroenterology

## 2018-03-25 NOTE — Telephone Encounter (Signed)
Dr Ardis Hughs the pt has a procedure scheduled for 3/12 at [facility] and was seen by her internal med MD and would like for you to review to be sure she is safe to proceed. Please advise    Office Visit   03/04/2018 [place]    Velna Ochs, MD  Internal Medicine   Chronic respiratory failure with hypoxia [facility]) +5 more  Dx   Cough , Fever , Muscle Pain   ; Referred by Velna Ochs, MD  Reason for Visit   Additional Documentation   Vitals:   BP 154/98 (BP Location: Right Arm, Patient Position: Sitting, Cuff Size: Large)   Pulse 95   Temp 98.9 F (37.2 C) (Oral)   Ht 5\' 11"  (1.803 m)   Wt 329 lb 3.2 oz (149.3 kg)    LMP 02/17/2018 (Exact Date)   SpO2 95%    BMI 45.91 kg/m   BSA 2.73 m   Pain [place] 10-Worst pain ever     More Vitals   Flowsheets:   MEWS Score,   Anthropometrics,   Clinical Intake,   Falls,   Functional Status Survey,   Healthcare Directives,   PHQ-9 Depression Scale   .[name].(3 more)  Encounter Info:   Billing Info,   History,   Allergies,   Detailed Report     All Notes   Progress Notes by Bartholomew Crews, MD at 03/04/2018 8:45 AM  Author: Bartholomew Crews, MD Author Type: Physician Filed: 03/04/2018 4:10 PM  Note Status: Signed Cosign: Cosign Not Required Encounter Date: 03/04/2018  Editor: Bartholomew Crews, MD (Physician)    Internal Medicine Clinic Attending  Case discussed with Dr. Ivan Anchors the time of the visit. We reviewed the resident's history and exam and pertinent patient test results. I agree with the assessment, diagnosis, and plan of care documented in the resident's note.       Addendum Note by Velna Ochs, MD at 03/04/2018 8:45 AM  Author: Velna Ochs, MD Author Type: Resident Filed: 03/04/2018 3:54 PM  Note Status: Signed Cosign: Cosign Not Required Encounter Date: 03/04/2018  Editor: Velna Ochs, MD (Resident)    Addended by: Jodean Lima on: 03/04/2018 03:54 PM    Modules accepted: Orders     Assessment & Plan Note by Velna Ochs, MD at 03/04/2018 9:19 AM  Author: Velna Ochs, MD Author Type: Resident Filed: 03/04/2018 3:53 PM  Note Status: [place] List: Cosign Not Required Encounter Date: 03/04/2018  Problem: Influenza A  Editor: Velna Ochs, MD (Resident)  Prior Versions: 1. Velna Ochs, MD (Resident) at 03/04/2018 9:24 AM - Written    Patient is here with complaint of cough, myalgias, fevers, nausea, and one episode of emesis ongoing for the past week. Other family members in the house have been sick with similar symptoms. She is afebrile here, hypoxic but this is chronic. On exam she has bilateral expiratory wheezing and decreased air movement which is new. Discussed possibly etiology of influenza vs. Other viral URIs and supportive treatment. Will check a CXR. Out side of window for tamiflu treatment but will check flu swab for diagnostic purposes.  -- CXR  -- Flu A/B swab  -- Tessalon perles prn -- Albuterol prn   ADDENDUM: Flu A positive, CXR without definite consolidation. Possible early development of right upper lobe infiltrate. Will hold off on antibiotics for now, continue supportive care. No tamiflu because outside of treatment window. Low threshold to treat for superimposed bacterial pneumonia if she does not  improve or symptoms worsen.     Progress Notes by Velna Ochs, MD at 03/04/2018 8:45 AM  Author: Velna Ochs, MD Author Type: Resident Filed: 03/04/2018 10:01 AM  Note Status: Signed Cosign: Cosign Not Required Encounter Date: 03/04/2018  Editor: Velna Ochs, MD (Resident)      CC: Cough, shortness of breath   HPI:  Lindsay [name] is a 48 y.o. female with past medical history outlined below here for cough and shortness of breath. For the details of today's visit, please refer to the assessment and plan.      Past Medical History:  Diagnosis Date  . Acid reflux   . Anemia    Iron  Def  . Anorexia   . Chronic kidney disease    Nephrotic syndrome  . Colon polyp 2009  . Depression with anxiety   . Edema leg   . Fibromyalgia   . Hemorrhoids   . Hidradenitis suppurativa   . Hypertension   . IBS (irritable bowel syndrome)   . Low back pain   . Migraines   . Morbidly obese ([place])   . Neuromuscular disorder (HCC)    fibromyalgia  . Neuropathy   . Panic attacks   . Polyp of vocal cord or larynx   . Tonsil pain     Review of Systems  Respiratory: Positive for cough, shortness of breath and wheezing.   Gastrointestinal: Positive for nausea and vomiting.  Musculoskeletal: Positive for myalgias.    Physical Exam:     Vitals:   03/04/18 0837  BP: (!) 154/98  Pulse: 95  Temp: 98.9 F (37.2 C)  TempSrc: Oral  SpO2: 95%  Weight: (!) 329 lb 3.2 oz (149.3 kg)  Height: 5\' 11"  (1.803 m)    Constitutional: NAD, appears comfortable Cardiovascular: RRR, no m/r/g Pulmonary/Chest: Bilateral expiratory wheezing, no rales or rhonchi  Extremities: Warm and well perfused. No edema.  Psychiatric: Normal mood and affect  Assessment & Plan:   See Encounters Tab for problem based charting.  Patient discussed with Dr. Lynnae January    Assessment & Plan Note by Velna Ochs, MD at 03/04/2018 10:00 AM  Author: Velna Ochs, MD Author Type: Resident Filed: 03/04/2018 10:01 AM  Note Status: Written Cosign: Cosign Not Required Encounter Date: 03/04/2018  Problem: Iron deficiency  Editor: Velna Ochs, MD (Resident)    Attempted to arrange IV iron infusion previously, was not scheduled. Will follow up and re order if needed.     Assessment & Plan Note by Velna Ochs, MD at 03/04/2018 9:24 AM  Author: Velna Ochs, MD Author Type: Resident Filed: 03/04/2018 9:58 AM  Note Status: Written Cosign: Cosign Not Required Encounter Date: 03/04/2018  Problem: Chronic respiratory failure with hypoxia [facility])  Editor: Velna Ochs, MD  (Resident)    Patient has developed chronic hypoxia after hospitalization in October for viral bronchitis that is currently being worked up. Please see prior office visit notes. Patient is now requiring 4L [place] at home. She desaturated to 84% during ambulatory pulse ox today in clinic, improved to low 90s with rest and supplemental oxygen.    Work up thus far has consisted of: -- 12/14/2014 Sleep Study: did not meet criteria for OSA or second half of split study but did have some apneic episodes.  -- 05/19/2017  ECHO (prior to hospitalization & hypoxia): EF 55-60%, G1DD, normal PA pressures (18), Intra-atrial aneurysmal dilation, ?PFO -- 10/2017 Hospitalized for viral bronchitis; persistent hypoxia on discharge -- 11/17/2017 Elevated D-dimer -- 12/04/2017 CTA Chest: no  evidence of acute PE; dilation of pulmonary artery to 4cm ?PAH; some atelectic changes to RLL w/o nodules or consolidation -- 02/09/2018 PFTs: No obstruction, mild restriction, severe diffusion defect with low DLCO (FEV1/FVC 93%, FEV1, 57, TLC 74%, DLCO 40) -- 02/09/2018 Repeat ECHO: EF 55-60%, mild right atrial dilation, no mention of PA pressures or diastolic function, no mention of intraatrial septum aneurysmal dilation.  Assessment:  Etiology at this point is concerning for PAH (dilated on CT chest but estimated normal pressures on ehco), Chronic PEs, or intracardiac shunt. V/Q scan has been ordered but not yet scheduled. If V/Q scan is negative, may need right heart cath and / or bubbles study for further work up. Of note, she did have some improvement in her hypoxia initially with diuresis. It is possible there is a component of diastolic heart failure contributing, however hypoxia has now persisted despite adequate diuresis. She is currently taking 80 mg lasix BID and weight is down 10 lbs since her last visit.   Plan: -- Continue Supplemental O2 -- V/Q scan, further work up pending results  -- Continue lasix 80 mg BID for now;  checking BMP     Patient Instructions by Velna Ochs, MD at 03/04/2018 8:45 AM  Author: Velna Ochs, MD Author Type: Resident Filed: 03/04/2018 9:15 AM  Note Status: Addendum Cosign: Cosign Not Required Encounter Date: 03/04/2018  Editor: Velna Ochs, MD (Resident)  Prior Versions: 1. Velna Ochs, MD (Resident) at 03/04/2018 9:13 AM - Signed    Ms. Wonders,  I am sorry to hear you have not been feeling well. For your cough and cold symptoms, you may take tessalon perles up to three times a day as needed. You may also take over the counter mucinex for congestion as needed.   I will call you with the results of your blood test and your chest x-ray. Please keep your scheduled appointment with me later this month. If you have any questions or concerns, call our clinic at [phone] or after hours call [phone] and ask for the internal medicine resident on call. Thank you!  Dr. Philipp Ovens     Instructions   Ms. Dittmar,  I am sorry to hear you have not been feeling well. For your cough and cold symptoms, you may take tessalon perles up to three times a day as needed. You may also take over the counter mucinex for congestion as needed.   I will call you with the results of your blood test and your chest x-ray. Please keep your scheduled appointment with me later this month. If you have any questions or concerns, call our clinic at [phone] or after hours call [phone] and ask for the internal medicine resident on call. Thank you!  Dr. Philipp Ovens         After Visit Summary (Printed 03/04/2018)

## 2018-03-25 NOTE — Telephone Encounter (Signed)
It should be safe for her to proceed as planned with colonoscopy in early to mid March.

## 2018-03-25 NOTE — Telephone Encounter (Signed)
PT said that she is having an appt with her doctor for chronic respiratory failure with hypoxia on 3-13 and would like to know if she should have her appt with [name] after the visit with her doctor to make sure she is okay to be put to sleep. Pt apt with [name] is on 3-12 at [facility].

## 2018-03-25 NOTE — Telephone Encounter (Signed)
The patient has been notified of this information and all questions answered. The pt has been advised of the information and verbalized understanding.    

## 2018-03-25 NOTE — Telephone Encounter (Signed)
Maxie Barb, RN    03/24/18 11:34 AM  Note    Call to patient to ask which medication a PA was needed.   Patient stated Toradol.  Message was addressed on yesterday by Dt.  Guilloud who wants patient to try Naproxen first.  Patient stated that she has already tried the Naproxen and this does nor helpthe pain in her knee.  Message to be sent to Dr. Philipp Ovens to order another med for pain.  Sander Nephew, RN 03/24/2018 11:34 AM.     Above conversation copied from 03/24/18 telephone encounter.  Spoke with patient again today-she verbalized same message above. States pain keeps her from standing at times, pain 9/10 today.  Will f/u with pcp for a response to this request.[place], Darlene Cassady2/26/202011:27 AM

## 2018-03-25 NOTE — Telephone Encounter (Signed)
Sorry for the duplicate request, but pt asked that I speak to you again regarding request for toradol.  Please advise.Despina Hidden Cassady2/26/20203:57 PM

## 2018-03-26 MED ORDER — KETOROLAC TROMETHAMINE 10 MG PO TABS: 10.0000 mg | ORAL_TABLET | Freq: Four times a day (QID) | ORAL | 1 refills | Status: DC | PRN

## 2018-03-26 NOTE — Telephone Encounter (Signed)
Prescription for toradol sent to [facility] on [address].

## 2018-03-27 DIAGNOSIS — J9620 Acute and chronic respiratory failure, unspecified whether with hypoxia or hypercapnia: Secondary | ICD-10-CM | POA: Diagnosis not present

## 2018-04-06 ENCOUNTER — Telehealth: Payer: Self-pay | Admitting: Gastroenterology

## 2018-04-06 ENCOUNTER — Other Ambulatory Visit: Payer: Self-pay | Admitting: Internal Medicine

## 2018-04-06 ENCOUNTER — Other Ambulatory Visit (HOSPITAL_COMMUNITY): Payer: Self-pay | Admitting: Psychiatry

## 2018-04-06 DIAGNOSIS — J9601 Acute respiratory failure with hypoxia: Secondary | ICD-10-CM

## 2018-04-06 DIAGNOSIS — F331 Major depressive disorder, recurrent, moderate: Secondary | ICD-10-CM

## 2018-04-06 DIAGNOSIS — F411 Generalized anxiety disorder: Secondary | ICD-10-CM

## 2018-04-06 NOTE — Telephone Encounter (Signed)
The pt called to cancel her procedure has possible pneumonia.  Will call back to reschedule when better.

## 2018-04-06 NOTE — Telephone Encounter (Signed)
Pt called to cancel her procedure scheduled at [facility] on 3/12. She stated that she has fever and feels very sick. She will call back to r/s.

## 2018-04-07 ENCOUNTER — Ambulatory Visit: Payer: Self-pay

## 2018-04-08 ENCOUNTER — Ambulatory Visit (INDEPENDENT_AMBULATORY_CARE_PROVIDER_SITE_OTHER): Payer: Medicare Other | Admitting: Internal Medicine

## 2018-04-08 ENCOUNTER — Other Ambulatory Visit: Payer: Self-pay | Admitting: Internal Medicine

## 2018-04-08 ENCOUNTER — Other Ambulatory Visit: Payer: Self-pay

## 2018-04-08 ENCOUNTER — Other Ambulatory Visit (HOSPITAL_COMMUNITY): Payer: Self-pay | Admitting: Internal Medicine

## 2018-04-08 VITALS — BP 154/79 | HR 86 | Temp 99.0°F | Wt 343.1 lb

## 2018-04-08 DIAGNOSIS — G8929 Other chronic pain: Secondary | ICD-10-CM

## 2018-04-08 DIAGNOSIS — Z923 Personal history of irradiation: Secondary | ICD-10-CM

## 2018-04-08 DIAGNOSIS — R093 Abnormal sputum: Secondary | ICD-10-CM | POA: Diagnosis not present

## 2018-04-08 DIAGNOSIS — Z9981 Dependence on supplemental oxygen: Secondary | ICD-10-CM

## 2018-04-08 DIAGNOSIS — M79605 Pain in left leg: Principal | ICD-10-CM

## 2018-04-08 DIAGNOSIS — R509 Fever, unspecified: Secondary | ICD-10-CM | POA: Diagnosis not present

## 2018-04-08 DIAGNOSIS — M79606 Pain in leg, unspecified: Secondary | ICD-10-CM

## 2018-04-08 DIAGNOSIS — R51 Headache: Secondary | ICD-10-CM | POA: Diagnosis not present

## 2018-04-08 DIAGNOSIS — J9611 Chronic respiratory failure with hypoxia: Secondary | ICD-10-CM

## 2018-04-08 DIAGNOSIS — R05 Cough: Secondary | ICD-10-CM | POA: Diagnosis not present

## 2018-04-08 DIAGNOSIS — Z8709 Personal history of other diseases of the respiratory system: Secondary | ICD-10-CM

## 2018-04-08 DIAGNOSIS — R062 Wheezing: Secondary | ICD-10-CM | POA: Diagnosis not present

## 2018-04-08 DIAGNOSIS — I10 Essential (primary) hypertension: Secondary | ICD-10-CM

## 2018-04-08 DIAGNOSIS — J309 Allergic rhinitis, unspecified: Secondary | ICD-10-CM

## 2018-04-08 DIAGNOSIS — R053 Chronic cough: Secondary | ICD-10-CM

## 2018-04-08 MED ORDER — ACETAMINOPHEN-CODEINE #3 300-30 MG PO TABS: 1.0000 | ORAL_TABLET | ORAL | 0 refills | Status: AC | PRN

## 2018-04-08 MED ORDER — FERUMOXYTOL INJECTION 510 MG/17 ML: 510.0000 mg | Freq: Once | INTRAVENOUS | Status: DC

## 2018-04-08 MED ORDER — PREDNISONE 20 MG PO TABS: 40.0000 mg | ORAL_TABLET | Freq: Every day | ORAL | 0 refills | Status: AC

## 2018-04-08 MED ORDER — AMLODIPINE BESYLATE 10 MG PO TABS: 10.0000 mg | ORAL_TABLET | Freq: Every day | ORAL | 0 refills | Status: DC

## 2018-04-08 NOTE — Patient Instructions (Addendum)
It was a pleasure to see you today Lindsay [name]. I am sorry to hear that you are continuing to not feel well.   -Please stop losartan and start taking amlodipine 10mg  daily -Please start taking prednisone 40mg  daily (during the daytime) for 5 days -Please use albuterol inhaler on an as needed basis  -Please use tylenol#3 every 4 hours for next 5 days -Follow up in 4 weeks   If you have any questions or concerns, please call our clinic at [phone] between 9am-5pm and after hours call [phone] and ask for the internal medicine resident on call. If you feel you are having a medical emergency please call 911.   Thank you, we look forward to help you remain healthy!  Lars Mage, MD Internal Medicine PGY2

## 2018-04-08 NOTE — Assessment & Plan Note (Signed)
Patient's feraheme order has expired will refill and place new hospital order.

## 2018-04-08 NOTE — Progress Notes (Signed)
I saw and evaluated the patient. I personally confirmed the key portions of Dr. Thea Gist history and exam and reviewed pertinent patient test results. The assessment, diagnosis, and plan were formulated together and I agree with the documentation in the resident's note.  She has had brown sputum X 1 week.  With the reported fever we were concerned initially about a pneumonia, although Lindsay [name] was appropriately concerned about all of the radiation she has recently received and therefore asked if the CXR could be deferred unless absolutely necessary.  As she explained her symptomatology and time course, we became more concerned about asthma given obstruction on examination, worsening cough (? acute exacerbation), prior history of asthma, failure to respond to a decent antibiotic course, and onset of her more chronic symptoms coming after moving to a different home.  Her main complaint to me was the cough and associated headache.  We decided to empirically treat for an asthma exacerbation with prednisone 40 mg PO QAM X 5 days and treat the associated headache with tylenol #3 one tablet PO Q6H X 7 days.  We will reassess her response to this therapy.  If positive, she will require a steroid inhaler and likely a LABA for maintenance.  This still does not explain the hypoxia and she was encouraged to complete the work-up including a bubble Echo scheduled for Friday.  We will call her on Monday to assess her response to the steroid burst, and if significant, will then initiate the steroid/LABA combination.  We will also assess the cough off of the ARB.

## 2018-04-08 NOTE — Assessment & Plan Note (Signed)
Patient continuing to require 4 L via nasal cannula she is scheduled for bubble study and cardiology referral for right heart cath to further evaluate because of chronic hypoxia.

## 2018-04-08 NOTE — Progress Notes (Signed)
   CC: Respiratory symptoms  HPI:  Lindsay [name] is a 48 y.o. with essential hypertension, allergic rhinitis who presents for elevation of upper respiratory symptoms. Please see problem based charting for evaluation, assessment, and plan.  Past Medical History:  Diagnosis Date  . Acid reflux   . Anemia    Iron Def  . Anorexia   . Chronic kidney disease    Nephrotic syndrome  . Colon polyp 2009  . Depression with anxiety   . Edema leg   . Fibromyalgia   . Hemorrhoids   . Hidradenitis suppurativa   . Hypertension   . IBS (irritable bowel syndrome)   . Low back pain   . Migraines   . Morbidly obese ([place])   . Neuromuscular disorder (HCC)    fibromyalgia  . Neuropathy   . Panic attacks   . Polyp of vocal cord or larynx   . Tonsil pain    Review of Systems:    Review of Systems  Constitutional: Positive for fever and malaise/fatigue. Negative for chills and weight loss.  Respiratory: Positive for cough, sputum production, shortness of breath and wheezing.   Cardiovascular: Negative for chest pain and leg swelling.  Gastrointestinal: Positive for constipation. Negative for abdominal pain, nausea and vomiting.  Musculoskeletal: Positive for joint pain and myalgias.  Neurological: Positive for headaches. Negative for dizziness.   Physical Exam:  Vitals:   04/08/18 0834  BP: (!) 154/79  Pulse: 86  Temp: 99 F (37.2 C)  TempSrc: Oral  SpO2: 95%  Weight: (!) 343 lb 1.6 oz (155.6 kg)   Physical Exam  Constitutional: She appears well-developed and well-nourished. No distress.  HENT:  Head: Normocephalic and atraumatic.  Eyes: Conjunctivae are normal.  Cardiovascular: Normal rate, regular rhythm and normal heart sounds.  Respiratory: Effort normal. No respiratory distress. She has wheezes (mild amount throughout lung fields).  GI: Soft. Bowel sounds are normal. She exhibits no distension. There is no abdominal tenderness.  Musculoskeletal:        General:  Tenderness (to palpation of bilateral lower extremities ) present. No edema.  Neurological: She is alert.  Skin: She is not diaphoretic. No erythema.  Psychiatric: She has a normal mood and affect. Her behavior is normal. Judgment and thought content normal.   Assessment & Plan:   See Encounters Tab for problem based charting.  Patient discussed with Dr. Eppie Gibson

## 2018-04-08 NOTE — Progress Notes (Signed)
Placed order for feraheme infusion

## 2018-04-08 NOTE — Assessment & Plan Note (Signed)
Patient states that she has been having productive cough, subjective fevers and chills, sweating, bilateral ear pain, myalgias. The cough has been present for the past 3-4 months with recent change over the past week of bringing up yellowish brown colored sputum. She states that her temperature has been 101.2 this morning. Sick contacts of little sister who had the flu and niece who had a cold approximately one month ago. She has been using tylenol and ibuprofen without relief.   Patient was diagnosed with influenza a on 03/04/2018, but she was outside the treatment window so was not treated with Tamiflu. At subsequent encounter when symptoms persisted she was given a 7-day course of cefdinir 300 mg twice daily.  Assessment and plan  The patient has been having a chronic cough for the past 3-4 months now. There is concern for the patient having asthma. She used to use albuterol and another inhaler in the past. Her family member who lives with her also has asthma. She states that she recently moved in July 2019 and she felt that her cough became worse at that time. Due to environmental change precipitating asthma we will treat.  PFTs done 02/09/2018 showed FEV1/FVC 93%, FEV1 57%, DLCO 40.  Although PFTs do not suggest asthma it is still possible for patient to have this diagnosis as it might not have been captured on pfts.  -Prescribed prednisone 40mg  daily for 5 days  -Prescribed tylenol #3 q4hrs for 5 days  -Stopped losartan and started patient on amlodipine 10mg  qd -Patient is to follow up in 4 weeks time. If patient's cough persists at that time would recommend chest xray and placing patient on ppi therapy for gerd

## 2018-04-08 NOTE — Progress Notes (Deleted)
Cardiology Office Note   Date:  04/08/2018   ID:  Lindsay [name], DOB 1971-01-01, MRN [id number]  PCP:  Velna Ochs, MD  Cardiologist: Dr. Kirk Ruths, MD  No chief complaint on file.  History of Present Illness: Lindsay [name] is a 48 y.o. female who presents for 6 month follow up, seen for Dr. Stanford Breed.    Lindsay [name] has a prior hx of fibromyalgia, depression, anxiety and GERD who was last seen as a new pt in the office 05/06/2017 for palpitations. She was sent to Cardiology at the request of Dr. Ina Homes for palpitations and mild chest discomfort. She was noted to be on disability due to chronic pain in her legs and feet. She has had ABI's which were negative on 04/11/2017. Negative sleep study in 2016.   During her last appointment, she reported that her HR would increase with minimal exertion including walking or doing ADL's. She discussed this with her PCP who recommended that she have cardiac evaluation.   She was then placed on a 30-day monitor of which she wore. This showed no significant arrthymias with NSR and PVCs. She missed her follow-up appointment and has not been seen in the office since this time.   On a separate occasion, she had an echocardiogram completed earlier this year (01/2018) which showed a normal LVEF and no WMA.   She has been followed by [facility] clinic in which she has had issues with chronic respiratory failure after hospitalization in October for viral bronchitis. She was seen 03/04/2018 and tested + for the flu. CXR was without consolidation. At last office visit, she was requiring 4L [place] at home. She desaturated to 84% during ambulatory pulse ox in clinic, improved to low 90s with rest and supplemental oxygen. So far, her workup has included a sleep study which did not meet criteria for OSA, performed 12/14/2014. Normal echo 05/19/2017. 10/2017 hospitalized with bronchitis and has had persistent hypoxia since that time. 11/17/2017,  elevated d-dimer. CTA 12/04/2017 with no evidence of PE however had pulmonary artery dilation to 4cm consistent with PAH??. PFT's 02/09/2018 with no obstruction, mild restriction, severe diffusion defect with low DLCO (FEV1/FVC 93%, FEV1, 57, TLC 74%, DLCO 40). 02/09/2018 Repeat ECHO: EF 55-60%, mild right atrial dilation, no mention of PA pressures or diastolic function, no mention of intraatrial septum aneurysmal dilation. Given no clear etiology for her persistent hypoxia and dilated PA on CTA, there is concern for PAH. Plan was for V/Q scan per IM. This was performed 03/18/2018 which showed no perfusion defects.    At this point, may require right heart cath for more definitive evaluation. She currently takes 80mg  Lasix twice daily. Additionally, she is to undergo a bubble study.   Today she presents     1. Frequent palpitations and heart racing: Self reported by patient and associated with minimal exertion. I will place a 30 day cardiac monitor to evaluate for frequency and morphology of her palpitations.   2. DOE: May be multifactorial with morbid obesity, deconditioning, and possible OSA. She has gained a considerable amount of weight and may need to be restudied, as this has not been completed since 2016. I will check echocardiogram for structural heart disease and LV fx.   3. Hypertension: Elevated today. She is currently not on antihypertensive therapy, Will follow up on this. If continues elevated on next visit (this is a new patient visit), will need to start her on ARB or CCB. She is to have  BMET today.   4. Unknown lipid status: Will check fasting lipids and LFT's   5. Morbid Obesity: I have looked at recent labs. TSH is normal. She has not had Hgb A1C recently. Will check this on blood draw    Past Medical History:  Diagnosis Date  . Acid reflux   . Anemia    Iron Def  . Anorexia   . Chronic kidney disease    Nephrotic syndrome  . Colon polyp 2009  . Depression with  anxiety   . Edema leg   . Fibromyalgia   . Hemorrhoids   . Hidradenitis suppurativa   . Hypertension   . IBS (irritable bowel syndrome)   . Low back pain   . Migraines   . Morbidly obese ([place])   . Neuromuscular disorder (HCC)    fibromyalgia  . Neuropathy   . Panic attacks   . Polyp of vocal cord or larynx   . Tonsil pain     Past Surgical History:  Procedure Laterality Date  . AXILLARY HIDRADENITIS EXCISION    . COLONOSCOPY WITH PROPOFOL N/A 05/25/2015   Procedure: COLONOSCOPY WITH PROPOFOL;  Surgeon: Milus Banister, MD;  Location: [facility];  Service: Endoscopy;  Laterality: N/A;  . HEMORRHOID SURGERY     with Hidradenitis surgery   . INGUINAL HIDRADENITIS EXCISION    . TONSILLECTOMY  10/18/2010   by Dr. Wilburn Cornelia  . UPPER GASTROINTESTINAL ENDOSCOPY       Current Outpatient Medications  Medication Sig Dispense Refill  . acetaminophen-codeine (TYLENOL #3) 300-30 MG tablet Take 1 tablet by mouth every 4 (four) hours as needed for up to 5 days for moderate pain. 30 tablet 0  . albuterol (PROVENTIL HFA;VENTOLIN HFA) 108 (90 Base) MCG/ACT inhaler Inhale 2 puffs into the lungs every 6 (six) hours as needed for wheezing or shortness of breath. 1 Inhaler 2  . albuterol (PROVENTIL) (2.5 MG/3ML) 0.083% nebulizer solution Take 3 mLs (2.5 mg total) by nebulization every 4 (four) hours. 75 mL 12  . amLODipine (NORVASC) 10 MG tablet Take 1 tablet (10 mg total) by mouth daily. 90 tablet 0  . benzonatate (TESSALON PERLES) 100 MG capsule Take 1 capsule (100 mg total) by mouth every 6 (six) hours as needed for cough. 30 capsule 1  . Brexpiprazole (REXULTI) 2 MG TABS Take 2 mg by mouth daily. 30 tablet 0  . cefdinir (OMNICEF) 300 MG capsule Take 1 capsule (300 mg total) by mouth 2 (two) times daily. 14 capsule 0  . cholecalciferol (VITAMIN D) 1000 units tablet Take 1 tablet (1,000 Units total) by mouth daily. 30 tablet 3  . clorazepate (TRANXENE) 15 MG tablet TAKE 1 TABLET BY MOUTH EVERY  DAY IN THE MORNING 30 tablet 0  . DULoxetine (CYMBALTA) 60 MG capsule Take 1 capsule (60 mg total) by mouth 2 (two) times daily. 60 capsule 4  . ferumoxytol (FERAHEME) 510 MG/17ML SOLN injection Inject 17 mLs (510 mg total) into the vein once for 1 dose. Rx 2 of 2; please administer 1 week after first dose 17 mL 0  . ferumoxytol (FERAHEME) 510 MG/17ML SOLN injection Inject 17 mLs (510 mg total) into the vein once for 1 dose. Rx 1 of 2 17 mL 0  . fluticasone (FLONASE) 50 MCG/ACT nasal spray Place 2 sprays into both nostrils daily. 16 g 2  . furosemide (LASIX) 80 MG tablet TAKE 1 TABLET (80 MG TOTAL) BY MOUTH 2 (TWO) TIMES DAILY. 14 tablet 0  . guaiFENesin (MUCINEX)  600 MG 12 hr tablet Take 600 mg by mouth 2 (two) times daily as needed for cough or to loosen phlegm.    . hydrochlorothiazide (HYDRODIURIL) 25 MG tablet Take 1 tablet (25 mg total) by mouth daily. 90 tablet 3  . hydrOXYzine (VISTARIL) 50 MG capsule Take one capsule daily as needed for anxiety 30 capsule 0  . ketorolac (TORADOL) 10 MG tablet Take 1 tablet (10 mg total) by mouth every 6 (six) hours as needed. 30 tablet 1  . Multiple Vitamin (MULTIVITAMIN WITH MINERALS) TABS tablet Take 1 tablet by mouth daily.    . naproxen (NAPROSYN) 500 MG tablet Take 1 tablet (500 mg total) by mouth 2 (two) times daily as needed for headache (with meals). 30 tablet 0  . omeprazole (PRILOSEC) 40 MG capsule Take 1 capsule (40 mg total) by mouth daily. 30 capsule 11  . polyethylene glycol-electrolytes (NULYTELY/GOLYTELY) 420 g solution Take 4,000 mLs by mouth as directed. 4000 mL 0  . predniSONE (DELTASONE) 20 MG tablet Take 2 tablets (40 mg total) by mouth daily with breakfast for 5 days. 10 tablet 0  . pregabalin (LYRICA) 100 MG capsule TAKE 1 CAPSULE BY MOUTH THREE TIMES A DAY 90 capsule 2  . promethazine (PHENERGAN) 12.5 MG tablet Take 1 tablet (12.5 mg total) by mouth every 12 (twelve) hours as needed for nausea or vomiting. 14 tablet 0  . promethazine  (PHENERGAN) 12.5 MG tablet Take 1 tablet (12.5 mg total) by mouth every 6 (six) hours as needed for nausea or vomiting. 60 tablet 11  . vitamin B-12 (CYANOCOBALAMIN) 1000 MCG tablet Take 1 tablet (1,000 mcg total) by mouth daily. 30 tablet 3  . vitamin C (ASCORBIC ACID) 500 MG tablet Take 500 mg by mouth daily.     Current Facility-Administered Medications  Medication Dose Route Frequency Provider Last Rate Last Dose  . ferumoxytol [facility]) injection 510 mg  510 mg Intravenous Once Chundi, Verne Spurr, MD        Allergies:   Lisinopril    Social History:  The patient  reports that she has been smoking cigarettes. She has a 12.50 pack-year smoking history. She has never used smokeless tobacco. She reports that she does not drink alcohol or use drugs.   Family History:  The patient'sfamily history includes Anxiety disorder in her mother and sister; Cancer in her father; Depression in her mother, sister, and sister; Diabetes in her mother; Heart disease in her father and mother; High blood pressure in her mother; Kidney failure in her mother; Learning disabilities in her sister.    ROS:  Please see the history of present illness. Otherwise, review of systems are positive for {NONE DEFAULTED:18576::"none"}. All other systems are reviewed and negative.    PHYSICAL EXAM: VS:  There were no vitals taken for this visit. , BMI There is no height or weight on file to calculate BMI.   General: Well developed, well nourished, NAD Skin: Warm, dry, intact  Head: Normocephalic, atraumatic, sclera non-icteric, no xanthomas, clear, moist mucus membranes. Neck: Negative for carotid bruits. No JVD Lungs:Clear to ausculation bilaterally. No wheezes, rales, or rhonchi. Breathing is unlabored. Cardiovascular: RRR with S1 S2. No murmurs, rubs, gallops, or LV heave appreciated. Abdomen: Soft, non-tender, non-distended with normoactive bowel sounds. No hepatomegaly, No rebound/guarding. No obvious abdominal  masses. MSK: Strength and tone appear normal for age. 5/5 in all extremities Extremities: No edema. No clubbing or cyanosis. DP/PT pulses 2+ bilaterally Neuro: Alert and oriented. No focal deficits. No facial  asymmetry. MAE spontaneously. Psych: Responds to questions appropriately with normal affect.     EKG:  EKG {ACTION; IS/IS GMW:10272536} ordered today. The ekg ordered today demonstrates ***   Recent Labs: 05/06/2017: ALT 11 11/07/2017: B Natriuretic Peptide 295.5 11/08/2017: Hemoglobin 11.4; Platelets 345 03/04/2018: BUN 6; Creatinine, Ser 0.68; Potassium 4.0; Sodium 145    Lipid Panel    Component Value Date/Time   CHOL 139 05/06/2017 1114   TRIG 47 05/06/2017 1114   HDL 69 05/06/2017 1114   CHOLHDL 2.0 05/06/2017 1114   CHOLHDL 2.1 09/15/2013 1032   VLDL 8 09/15/2013 1032   LDLCALC 61 05/06/2017 1114      Wt Readings from Last 3 Encounters:  04/08/18 (!) 343 lb 1.6 oz (155.6 kg)  03/18/18 (!) 341 lb 1.6 oz (154.7 kg)  03/04/18 (!) 329 lb 3.2 oz (149.3 kg)    Other studies Reviewed: Additional studies/ records that were reviewed today include:   Echocardiogram 02/09/2018:  Study Conclusions  - Left ventricle: The cavity size was normal. Wall thickness was   normal. Systolic function was normal. The estimated ejection   fraction was in the range of 55% to 60%. Wall motion was normal;   there were no regional wall motion abnormalities. - Right atrium: The atrium was mildly dilated.   Event monitor 05/19/2017:  Normal sinus rhythm to sinus tachycardia with PVCs. Kirk Ruths, MD  ASSESSMENT AND PLAN:  1.  ***   Current medicines are reviewed at length with the patient today.  The patient {ACTIONS; HAS/DOES NOT HAVE:19233} concerns regarding medicines.  The following changes have been made:  {PLAN; NO CHANGE:13088:s}  Labs/ tests ordered today include: *** No orders of the defined types were placed in this encounter.    Disposition:   FU with ***  in {gen number 6-44:034742} {Days to years:10300}  Signed, Kathyrn Drown, NP  04/08/2018 7:54 PM    [facility] [place], [place], [place]  [address] Phone: [phone]; Fax: [phone]

## 2018-04-09 ENCOUNTER — Other Ambulatory Visit: Payer: Self-pay

## 2018-04-09 ENCOUNTER — Ambulatory Visit (HOSPITAL_COMMUNITY): Admission: RE | Admit: 2018-04-09 | Payer: Medicare Other | Source: Home / Self Care | Admitting: Gastroenterology

## 2018-04-09 DIAGNOSIS — J9601 Acute respiratory failure with hypoxia: Secondary | ICD-10-CM

## 2018-04-09 SURGERY — COLONOSCOPY WITH PROPOFOL
Anesthesia: Monitor Anesthesia Care

## 2018-04-09 MED ORDER — FUROSEMIDE 80 MG PO TABS: 80.0000 mg | ORAL_TABLET | Freq: Two times a day (BID) | ORAL | 0 refills | Status: DC

## 2018-04-09 NOTE — Telephone Encounter (Signed)
Lindsay [name] with [facility] needs to speak with a nurse about furosemide (LASIX) 80 MG tablet. Please call back @ [phone].

## 2018-04-09 NOTE — Telephone Encounter (Signed)
Called [facility] packaging - pt needs refill on lasix.  Called pt - stated she's now using them ;delivery to her home.

## 2018-04-10 ENCOUNTER — Encounter: Payer: Self-pay | Admitting: *Deleted

## 2018-04-10 ENCOUNTER — Ambulatory Visit: Payer: Medicare Other | Admitting: Cardiology

## 2018-04-10 ENCOUNTER — Other Ambulatory Visit (HOSPITAL_COMMUNITY): Payer: Self-pay | Admitting: Internal Medicine

## 2018-04-10 ENCOUNTER — Ambulatory Visit: Payer: Medicare Other | Admitting: Physician Assistant

## 2018-04-10 NOTE — Addendum Note (Signed)
Addended by: Hulan Fray on: 04/10/2018 04:12 PM   Modules accepted: Orders

## 2018-04-13 ENCOUNTER — Other Ambulatory Visit (HOSPITAL_COMMUNITY): Payer: Self-pay

## 2018-04-13 DIAGNOSIS — F411 Generalized anxiety disorder: Secondary | ICD-10-CM

## 2018-04-13 DIAGNOSIS — F331 Major depressive disorder, recurrent, moderate: Secondary | ICD-10-CM

## 2018-04-13 MED ORDER — CLORAZEPATE DIPOTASSIUM 15 MG PO TABS: ORAL_TABLET | ORAL | 0 refills | Status: DC

## 2018-04-13 MED ORDER — HYDROXYZINE PAMOATE 50 MG PO CAPS: ORAL_CAPSULE | ORAL | 0 refills | Status: DC

## 2018-04-13 MED ORDER — BREXPIPRAZOLE 2 MG PO TABS: 2.0000 mg | ORAL_TABLET | Freq: Every day | ORAL | 0 refills | Status: DC

## 2018-04-14 ENCOUNTER — Telehealth: Payer: Self-pay | Admitting: *Deleted

## 2018-04-14 ENCOUNTER — Telehealth: Payer: Self-pay | Admitting: General Practice

## 2018-04-14 NOTE — Telephone Encounter (Signed)
RETURNED CALL TO PATIENT, PATIENT STATES SHE HAS BEEN CALLING [facility] FOR ABOUT 2-3 WEEKS LEAVING MESSAGES WITH NO CALL BACK.  OFFICE CONTACTED / OFFICE TO FOLLOW UP.

## 2018-04-14 NOTE — Telephone Encounter (Signed)
Refill for lasix declined. Refill was just sent on 3/12.

## 2018-04-14 NOTE — Telephone Encounter (Signed)
Received call from patient's PCP office stating the patient told them she had been trying to call us for weeks but cannot reach anyone. They are requesting we contact the patient.  Called patient and she states she wants to make an appt with Dr Kennon Rounds and has been calling for weeks. Patient states she is always on hold for 40 minutes and no one calls her back. Apologized to patient and told her I would have someone from our front office call her with an appt. Patient verbalized understanding & had no questions.

## 2018-04-15 ENCOUNTER — Encounter: Payer: Self-pay | Admitting: Family Medicine

## 2018-04-15 ENCOUNTER — Ambulatory Visit: Payer: Self-pay | Admitting: Family Medicine

## 2018-04-16 DIAGNOSIS — R0602 Shortness of breath: Secondary | ICD-10-CM | POA: Diagnosis not present

## 2018-04-16 DIAGNOSIS — J9620 Acute and chronic respiratory failure, unspecified whether with hypoxia or hypercapnia: Secondary | ICD-10-CM | POA: Diagnosis not present

## 2018-04-16 DIAGNOSIS — J40 Bronchitis, not specified as acute or chronic: Secondary | ICD-10-CM | POA: Diagnosis not present

## 2018-04-22 ENCOUNTER — Telehealth: Payer: Self-pay | Admitting: *Deleted

## 2018-04-22 NOTE — Telephone Encounter (Signed)
Sounds like a tough situation. I don't have an easy solution to deliver over the phone. I think we can offer her an [facility] appointment tomorrow, eval for active infection, and trouble shoot from there. Thanks.

## 2018-04-22 NOTE — Telephone Encounter (Signed)
Returned call to patient. [facility] appt scheduled for tomorrow at 0915. Hubbard Hartshorn, RN, BSN

## 2018-04-22 NOTE — Telephone Encounter (Signed)
Patient called in stating she is having nausea and vomiting X 6 days she thinks is related to abscessed front tooth. No relief with phenergan. Tooth is broken at gum line. Dentist [facility] said he cannot remove tooth because she would need anesthesia and he will not do that with her health problems. Patient rates tooth pain at 9/10. Also running fever 99-100.9. Has been keeping liquids down but no solid food other than crackers. Had been feeling better while taking cefdnir for potential pneumonia in February 2020. Patient denies cough. Will route to PCP to advise. Hubbard Hartshorn, RN, BSN

## 2018-04-23 ENCOUNTER — Other Ambulatory Visit (HOSPITAL_COMMUNITY)
Admission: RE | Admit: 2018-04-23 | Discharge: 2018-04-23 | Disposition: A | Discharge status: Home / Self Care | Payer: Medicare Other | Source: Ambulatory Visit | Attending: Internal Medicine | Admitting: Internal Medicine

## 2018-04-23 ENCOUNTER — Ambulatory Visit (INDEPENDENT_AMBULATORY_CARE_PROVIDER_SITE_OTHER): Payer: Medicare Other | Admitting: Internal Medicine

## 2018-04-23 ENCOUNTER — Other Ambulatory Visit: Payer: Self-pay

## 2018-04-23 ENCOUNTER — Encounter: Payer: Self-pay | Admitting: Internal Medicine

## 2018-04-23 VITALS — BP 140/73 | HR 47 | Temp 99.4°F | Ht 71.0 in | Wt 340.6 lb

## 2018-04-23 DIAGNOSIS — R05 Cough: Secondary | ICD-10-CM

## 2018-04-23 DIAGNOSIS — K047 Periapical abscess without sinus: Secondary | ICD-10-CM | POA: Diagnosis not present

## 2018-04-23 DIAGNOSIS — R351 Nocturia: Secondary | ICD-10-CM

## 2018-04-23 DIAGNOSIS — R1031 Right lower quadrant pain: Secondary | ICD-10-CM

## 2018-04-23 DIAGNOSIS — I10 Essential (primary) hypertension: Secondary | ICD-10-CM

## 2018-04-23 DIAGNOSIS — Z79899 Other long term (current) drug therapy: Secondary | ICD-10-CM

## 2018-04-23 DIAGNOSIS — R053 Chronic cough: Secondary | ICD-10-CM

## 2018-04-23 LAB — POCT URINALYSIS DIPSTICK
Bilirubin, UA: NEGATIVE
Blood, UA: NEGATIVE
Glucose, UA: NEGATIVE
Ketones, UA: NEGATIVE
Leukocytes, UA: NEGATIVE
Nitrite, UA: NEGATIVE
Protein, UA: NEGATIVE
Spec Grav, UA: 1.02 (ref 1.010–1.025)
Urobilinogen, UA: 0.2 E.U./dL
pH, UA: 7 (ref 5.0–8.0)

## 2018-04-23 LAB — GLUCOSE, CAPILLARY: GLUCOSE-CAPILLARY: 82 mg/dL (ref 70–99)

## 2018-04-23 MED ORDER — BUDESONIDE-FORMOTEROL FUMARATE 160-4.5 MCG/ACT IN AERO: 2.0000 | INHALATION_SPRAY | Freq: Two times a day (BID) | RESPIRATORY_TRACT | 1 refills | Status: DC

## 2018-04-23 MED ORDER — AMOXICILLIN-POT CLAVULANATE 875-125 MG PO TABS: 1.0000 | ORAL_TABLET | Freq: Two times a day (BID) | ORAL | 0 refills | Status: AC

## 2018-04-23 NOTE — Assessment & Plan Note (Addendum)
  The patient presents to be seen for tooth pain for the past week. The tooth pain is present in the front and radiating to right side. She states that she has had dental pain intermittently for the past year. The patient was seen by dentist Dr. Diona Browner who mentioned to her that he wanted to take her teeth out under general anesthesia, but due to patient's comorbidies and respiratory issues did not feel that it was safe to perform the procedure.   Patient reports that the her temperature has been around 100-101.   Assessment and plan  On exam, there was not a significant amount of erythema or drainage around the central incisor. However, due to the patient's fever there is concern for infectious process and thus will treat with 7 day course of Augmentin.

## 2018-04-23 NOTE — Assessment & Plan Note (Addendum)
Patient states that she has been urinating frequently especially at night time. She also has pain in right lower abdomen when urinating. Denies burning while urinating.   She takes both hctz and furosemide. Her last dose of furosemide is at 12/2pm in the afternoon.   She denies vaginal discharge or vaginal itching and states that her last sexual encounter was 1 year ago.   Assessment and plan  Got ua complete and urine culture to evaluate for uti. Will also look for amy hematuria to see if patient may have nephrolithiasis. Patient's glucose was checked to note if it can explain symptoms, but it was 82 (within normal limits).

## 2018-04-23 NOTE — Progress Notes (Signed)
   CC: Tooth infection  HPI:  [name] is a 49 y.o. female with hypertension, dental infections who presents for tooth infection. Please see problem based charting for evaluation, assessment, and plan.  Past Medical History:  Diagnosis Date  . Acid reflux   . Anemia    Iron Def  . Anorexia   . Chronic kidney disease    Nephrotic syndrome  . Colon polyp 2009  . Depression with anxiety   . Edema leg   . Fibromyalgia   . Hemorrhoids   . Hidradenitis suppurativa   . Hypertension   . IBS (irritable bowel syndrome)   . Low back pain   . Migraines   . Morbidly obese ([place])   . Neuromuscular disorder (HCC)    fibromyalgia  . Neuropathy   . Panic attacks   . Polyp of vocal cord or larynx   . Tonsil pain    Review of Systems:    Review of Systems  Constitutional: Positive for fever. Negative for chills.  Respiratory: Positive for cough. Negative for shortness of breath.   Gastrointestinal: Positive for abdominal pain. Negative for nausea and vomiting.  Genitourinary: Positive for flank pain and frequency. Negative for dysuria.  Neurological: Negative for dizziness.   Physical Exam:  Vitals:   04/23/18 0927 04/23/18 1002  BP: (!) 164/100 140/73  Pulse: 95 (!) 47  Temp: 99.4 F (37.4 C)   TempSrc: Oral   SpO2: 96%   Weight: (!) 340 lb 9.6 oz (154.5 kg)   Height: 5\' 11"  (1.803 m)    Physical Exam  Constitutional: She appears well-developed and well-nourished. No distress.  HENT:  Head: Normocephalic and atraumatic.  Mouth/Throat: Abnormal dentition. No dental abscesses or uvula swelling. No oropharyngeal exudate or posterior oropharyngeal erythema.  Eyes: Conjunctivae are normal.  Cardiovascular: Normal rate, regular rhythm and normal heart sounds.  Respiratory: Effort normal and breath sounds normal. No respiratory distress. She has no wheezes.  GI: Soft. Bowel sounds are normal. She exhibits no distension. There is abdominal tenderness (rlq ).   Musculoskeletal:        General: Tenderness (right flank) present.  Neurological: She is alert.  Skin: She is not diaphoretic.  Psychiatric: She has a normal mood and affect. Her behavior is normal. Judgment and thought content normal.       Assessment & Plan:   See Encounters Tab for problem based charting.  Patient discussed with Dr. Beryle Beams

## 2018-04-23 NOTE — Assessment & Plan Note (Signed)
The patient responded well to prednisone and therefore will continue to treat patient with symbicort for maintenance.

## 2018-04-23 NOTE — Patient Instructions (Signed)
It was a pleasure to see you today [name]. Please make the following changes:  -For your tooth infection: use augmentin twice a day for 7 days. please alternate tylenol and ibuprofen for pain. -For your breathing: please start taking symbicort 2 puffs twice a day -For your frequent urination: I have ordered some tests and will call you with results  If you have any questions or concerns, please call our clinic at [phone] between 9am-5pm and after hours call [phone] and ask for the internal medicine resident on call. If you feel you are having a medical emergency please call 911.   Thank you, we look forward to help you remain healthy!  Lars Mage, MD Internal Medicine PGY2

## 2018-04-24 LAB — URINALYSIS, COMPLETE
Bilirubin, UA: NEGATIVE
Glucose, UA: NEGATIVE
Ketones, UA: NEGATIVE
Leukocytes, UA: NEGATIVE
Nitrite, UA: NEGATIVE
Protein, UA: NEGATIVE
RBC UA: NEGATIVE
SPEC GRAV UA: 1.011 (ref 1.005–1.030)
Urobilinogen, Ur: 0.2 mg/dL (ref 0.2–1.0)
pH, UA: 7.5 (ref 5.0–7.5)

## 2018-04-24 LAB — CERVICOVAGINAL ANCILLARY ONLY
Bacterial vaginitis: POSITIVE — AB
Candida vaginitis: NEGATIVE
Chlamydia: NEGATIVE
Neisseria Gonorrhea: NEGATIVE
Trichomonas: NEGATIVE

## 2018-04-24 LAB — MICROSCOPIC EXAMINATION
CASTS: NONE SEEN /LPF
WBC, UA: NONE SEEN /hpf (ref 0–5)

## 2018-04-24 MED ORDER — METRONIDAZOLE 500 MG PO TABS: 500.0000 mg | ORAL_TABLET | Freq: Two times a day (BID) | ORAL | 0 refills | Status: AC

## 2018-04-24 NOTE — Progress Notes (Signed)
Medicine attending: Medical history, presenting problems, physical findings, and medications, reviewed with resident physician Dr Lars Mage on the day of the patient visit and I concur with her evaluation and management plan.

## 2018-04-24 NOTE — Addendum Note (Signed)
Addended by: Lars Mage on: 04/24/2018 03:32 PM   Modules accepted: Orders

## 2018-04-25 DIAGNOSIS — J9620 Acute and chronic respiratory failure, unspecified whether with hypoxia or hypercapnia: Secondary | ICD-10-CM | POA: Diagnosis not present

## 2018-04-25 LAB — URINE CULTURE: Organism ID, Bacteria: NO GROWTH

## 2018-04-27 ENCOUNTER — Telehealth: Payer: Self-pay | Admitting: *Deleted

## 2018-04-27 DIAGNOSIS — K047 Periapical abscess without sinus: Secondary | ICD-10-CM

## 2018-04-27 NOTE — Telephone Encounter (Signed)
Patient seen on 04/23/2018 for tooth pain. States she has not had any relief from antibiotic. She has been alternating 1000 mg ibuprofen with 650 mg tylenol every 4 hours with no relief. Rates "throbbing" gum pain and right sided facial pain extending to right ear 10/10. States temp is 99 degrees F. Has eaten potatoes today but eating is very difficult and painful. Trying to stay hydrated. She will call dentist today to see if there are any options to remove tooth that don't involve anesthesia, however, doesn't know what to do in interim. Will forward to PCP and provider who saw pt on 3/26. Hubbard Hartshorn, RN, BSN

## 2018-04-27 NOTE — Telephone Encounter (Signed)
Recommend the patient be seen by dentist if she continues to not have pain and worsening of her symptoms.

## 2018-04-29 ENCOUNTER — Ambulatory Visit (INDEPENDENT_AMBULATORY_CARE_PROVIDER_SITE_OTHER): Payer: Medicare Other | Admitting: Psychiatry

## 2018-04-29 ENCOUNTER — Other Ambulatory Visit: Payer: Self-pay

## 2018-04-29 DIAGNOSIS — F331 Major depressive disorder, recurrent, moderate: Secondary | ICD-10-CM | POA: Diagnosis not present

## 2018-04-29 DIAGNOSIS — F411 Generalized anxiety disorder: Secondary | ICD-10-CM

## 2018-04-29 MED ORDER — BENZOCAINE 10 % MT GEL: 1.0000 "application " | Freq: Three times a day (TID) | OROMUCOSAL | 1 refills | Status: DC | PRN

## 2018-04-29 MED ORDER — HYDROXYZINE PAMOATE 50 MG PO CAPS: ORAL_CAPSULE | ORAL | 0 refills | Status: DC

## 2018-04-29 MED ORDER — BREXPIPRAZOLE 3 MG PO TABS: 3.0000 mg | ORAL_TABLET | Freq: Every day | ORAL | 0 refills | Status: DC

## 2018-04-29 MED ORDER — CLORAZEPATE DIPOTASSIUM 15 MG PO TABS: ORAL_TABLET | ORAL | 0 refills | Status: DC

## 2018-04-29 NOTE — Telephone Encounter (Signed)
Pres[facility] [address]. Tell her to apply to affected area three times daily as needed for pain. Thank you!
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
## 2018-05-03 ENCOUNTER — Other Ambulatory Visit: Payer: Self-pay | Admitting: Internal Medicine

## 2018-05-03 DIAGNOSIS — G43909 Migraine, unspecified, not intractable, without status migrainosus: Secondary | ICD-10-CM

## 2018-05-06 ENCOUNTER — Telehealth: Payer: Self-pay | Admitting: Internal Medicine

## 2018-05-06 DIAGNOSIS — B3731 Acute candidiasis of vulva and vagina: Secondary | ICD-10-CM

## 2018-05-06 DIAGNOSIS — B373 Candidiasis of vulva and vagina: Secondary | ICD-10-CM

## 2018-05-06 MED ORDER — FLUCONAZOLE 150 MG PO TABS: 150.0000 mg | ORAL_TABLET | ORAL | 0 refills | Status: DC | PRN

## 2018-05-06 NOTE — Telephone Encounter (Signed)
Sent!

## 2018-05-06 NOTE — Telephone Encounter (Signed)
Patient notified that naproxen refill was sent to her pharmacy yesterday. States she wasn't aware and that this should be enough to help with H/A and left leg pain. States she doesn't need the Toradol now.   Also, completed course of augmentin and flagy 2 days ago and now with 4 day hx of vaginal itching and white, malodorous vaginal discharge. States she always needs 2 doses of diflucan to eliminate these symptoms. Hubbard Hartshorn, RN, BSN

## 2018-05-06 NOTE — Telephone Encounter (Signed)
Spoke with patient regarding another matter. Asked patient if she was aware of this rx and she stated she did pick this up but had already been using this at home along with tylenol. Only c/o today are H/A and left leg pain which is being addressed in separate encounter. Hubbard Hartshorn, RN, BSN

## 2018-05-06 NOTE — Telephone Encounter (Signed)
[facility] [address]. Take 1 tablet every 3 days as needed. Thank you.

## 2018-05-06 NOTE — Telephone Encounter (Signed)
Need refill on naproxen (NAPROSYN) 500 MG tablet ketorolac (TORADOL) 10 MG tablet  ;pt contact [phone] [id number] [place], [place]

## 2018-05-07 NOTE — Telephone Encounter (Signed)
Patient notified and is very Patent attorney. Hubbard Hartshorn, RN, BSN

## 2018-05-07 NOTE — Telephone Encounter (Signed)
Thank you :)

## 2018-05-14 ENCOUNTER — Other Ambulatory Visit: Payer: Self-pay | Admitting: Internal Medicine

## 2018-05-14 NOTE — Telephone Encounter (Signed)
Called pt to schedule an [facility] tele health appt; explained to pt who's agreeable. Appt scheduled tomorrow @ 55831 PM.

## 2018-05-14 NOTE — Telephone Encounter (Signed)
Called pt - stated she continues to have face/head pain. Stated her dentist (Dr Quincy Simmonds) will not be open until May 4th. Stated Tylenol and Aleve do not help. She will like a refill or something else like Tramadol. Thanks

## 2018-05-14 NOTE — Telephone Encounter (Signed)
She needs to be seen before refill.  Please have her placed for a telehealth visit.  She was prescribed for cough, not for tooth pain, so I am not clear on a good indication for refill.   Thanks.

## 2018-05-15 ENCOUNTER — Ambulatory Visit (INDEPENDENT_AMBULATORY_CARE_PROVIDER_SITE_OTHER): Payer: Medicare Other | Admitting: Internal Medicine

## 2018-05-15 ENCOUNTER — Other Ambulatory Visit: Payer: Self-pay | Admitting: Internal Medicine

## 2018-05-15 ENCOUNTER — Other Ambulatory Visit: Payer: Self-pay

## 2018-05-15 DIAGNOSIS — R053 Chronic cough: Secondary | ICD-10-CM

## 2018-05-15 DIAGNOSIS — B3731 Acute candidiasis of vulva and vagina: Secondary | ICD-10-CM

## 2018-05-15 DIAGNOSIS — B373 Candidiasis of vulva and vagina: Secondary | ICD-10-CM

## 2018-05-15 DIAGNOSIS — K047 Periapical abscess without sinus: Secondary | ICD-10-CM | POA: Diagnosis not present

## 2018-05-15 DIAGNOSIS — R05 Cough: Secondary | ICD-10-CM

## 2018-05-15 MED ORDER — FLUCONAZOLE 150 MG PO TABS: 150.0000 mg | ORAL_TABLET | ORAL | 0 refills | Status: DC | PRN

## 2018-05-15 MED ORDER — ACETAMINOPHEN-CODEINE #3 300-30 MG PO TABS: 1.0000 | ORAL_TABLET | Freq: Four times a day (QID) | ORAL | 0 refills | Status: AC | PRN

## 2018-05-15 MED ORDER — AMOXICILLIN-POT CLAVULANATE 875-125 MG PO TABS: 1.0000 | ORAL_TABLET | Freq: Two times a day (BID) | ORAL | 0 refills | Status: AC

## 2018-05-15 NOTE — Progress Notes (Signed)
   CC: Tooth pain  This is a telephone encounter between [name] and [facility] on 05/15/2018 for tooth pain. The visit was conducted with the patient located at home and [facility] at [facility]. The patient's identity was confirmed using their DOB and current address. The patient has consented to being evaluated through a telephone encounter and understands the associated risks (an examination cannot be done and the patient may need to come in for an appointment) / benefits (allows the patient to remain at home, decreasing exposure to coronavirus). I personally spent 8 minutes on medical discussion.   HPI:  [name] is a 48 y.o. with PMH as below.   Please see A&P for assessment of the patient's acute and chronic medical conditions.   Past Medical History:  Diagnosis Date  . Acid reflux   . Anemia    Iron Def  . Anorexia   . Chronic kidney disease    Nephrotic syndrome  . Colon polyp 2009  . Depression with anxiety   . Edema leg   . Fibromyalgia   . Hemorrhoids   . Hidradenitis suppurativa   . Hypertension   . IBS (irritable bowel syndrome)   . Low back pain   . Migraines   . Morbidly obese ([facility])   . Neuromuscular disorder (HCC)    fibromyalgia  . Neuropathy   . Panic attacks   . Polyp of vocal cord or larynx   . Tonsil pain    Review of Systems:  Performed and all others negative.  Assessment & Plan:   See Encounters Tab for problem based charting.  Patient discussed with Dr. Beryle Beams

## 2018-05-15 NOTE — Assessment & Plan Note (Signed)
HPI: Patient called with concerns of tooth pain. She states that she was seen on 3/26 prior to compaction and prescribed antibiotics at that point. Her pain significantly improved however 2 to 3 days ago her pain returned and she now has purulent discharge and a foul taste in her mouth. She was scheduled to see a dentist however with recent [place] outbreak has been pushed off till May 4. She has had multiple dental infections requiring several rounds of antibiotics and pain prescriptions.  A/P: - Without seeing the patient I think that she likely has a dental infection. We will start Augmentin 875 BID for seven days and Tylenol #3 for pain control - Ultimately she needs to see a dentist have her teeth pulled.

## 2018-05-15 NOTE — Progress Notes (Signed)
Medicine attending: Medical history, presenting problems, , and medications, reviewed with resident physician Dr Ina Homes on the day of the patient telephone consultation and I concur with his evaluation and management plan. 48 y/o known poor dentition. Suspected dental abscess s/P one course Augmentin. Now recurrent dental pain. Her dentist's office closed for Coronavirus. Refill antibiotic/Tylenol#3; add diflucan for secondary vaginal candidiasis.

## 2018-05-17 ENCOUNTER — Other Ambulatory Visit (HOSPITAL_COMMUNITY): Payer: Self-pay | Admitting: Psychiatry

## 2018-05-17 DIAGNOSIS — F331 Major depressive disorder, recurrent, moderate: Secondary | ICD-10-CM

## 2018-05-17 DIAGNOSIS — F411 Generalized anxiety disorder: Secondary | ICD-10-CM

## 2018-05-25 ENCOUNTER — Other Ambulatory Visit: Payer: Self-pay | Admitting: Internal Medicine

## 2018-05-25 DIAGNOSIS — G43909 Migraine, unspecified, not intractable, without status migrainosus: Secondary | ICD-10-CM

## 2018-05-26 DIAGNOSIS — J9620 Acute and chronic respiratory failure, unspecified whether with hypoxia or hypercapnia: Secondary | ICD-10-CM | POA: Diagnosis not present

## 2018-05-30 ENCOUNTER — Other Ambulatory Visit: Payer: Self-pay | Admitting: Internal Medicine

## 2018-05-30 DIAGNOSIS — M79605 Pain in left leg: Principal | ICD-10-CM

## 2018-05-30 DIAGNOSIS — J9601 Acute respiratory failure with hypoxia: Secondary | ICD-10-CM

## 2018-05-30 DIAGNOSIS — M797 Fibromyalgia: Secondary | ICD-10-CM

## 2018-05-30 DIAGNOSIS — M79604 Pain in right leg: Secondary | ICD-10-CM

## 2018-06-02 NOTE — Telephone Encounter (Signed)
LVM for patient to call back to schedule an appt.

## 2018-06-02 NOTE — Telephone Encounter (Signed)
Called pt to schedule an apt - no answer; left message to call the office.

## 2018-06-02 NOTE — Telephone Encounter (Signed)
Refill for lyrica approved, refill for lasix 80 mg BID declined. Please have patient schedule an in person follow up for this. She needs to have lab work done and be re evaluated for on going lasix need. Thank you.

## 2018-06-11 ENCOUNTER — Telehealth (HOSPITAL_COMMUNITY): Payer: Self-pay

## 2018-06-11 NOTE — Telephone Encounter (Signed)
Spoke with patient. She requested to have Clorazepate 15mg  transferred [facility] [address] [facility] [place], [place]. I have done that.

## 2018-06-11 NOTE — Telephone Encounter (Signed)
Patient called and requested her Rexulti 3mg  be transferred [facility] [place], [place] [facility] the [facility] [address]. I  called the [facility] in [place] and discontinued her Rexulti and her Hydroxyzine 50mg  and transferred them to the [facility] on [address]. She has an appointment scheduled for 08/03/18. Thank you.

## 2018-06-12 ENCOUNTER — Encounter: Payer: Self-pay | Admitting: Gastroenterology

## 2018-06-15 ENCOUNTER — Other Ambulatory Visit: Payer: Self-pay

## 2018-06-15 ENCOUNTER — Ambulatory Visit (INDEPENDENT_AMBULATORY_CARE_PROVIDER_SITE_OTHER): Payer: Medicare Other | Admitting: Internal Medicine

## 2018-06-15 VITALS — BP 159/93 | HR 93 | Temp 98.8°F | Ht 71.0 in | Wt 336.1 lb

## 2018-06-15 DIAGNOSIS — G629 Polyneuropathy, unspecified: Secondary | ICD-10-CM

## 2018-06-15 DIAGNOSIS — I10 Essential (primary) hypertension: Secondary | ICD-10-CM

## 2018-06-15 DIAGNOSIS — M79605 Pain in left leg: Secondary | ICD-10-CM | POA: Diagnosis not present

## 2018-06-15 DIAGNOSIS — E538 Deficiency of other specified B group vitamins: Secondary | ICD-10-CM

## 2018-06-15 DIAGNOSIS — R109 Unspecified abdominal pain: Secondary | ICD-10-CM

## 2018-06-15 DIAGNOSIS — E559 Vitamin D deficiency, unspecified: Secondary | ICD-10-CM | POA: Diagnosis not present

## 2018-06-15 DIAGNOSIS — K047 Periapical abscess without sinus: Secondary | ICD-10-CM | POA: Diagnosis not present

## 2018-06-15 DIAGNOSIS — N921 Excessive and frequent menstruation with irregular cycle: Secondary | ICD-10-CM

## 2018-06-15 DIAGNOSIS — D539 Nutritional anemia, unspecified: Secondary | ICD-10-CM | POA: Diagnosis not present

## 2018-06-15 DIAGNOSIS — J9601 Acute respiratory failure with hypoxia: Secondary | ICD-10-CM

## 2018-06-15 DIAGNOSIS — E611 Iron deficiency: Secondary | ICD-10-CM

## 2018-06-15 DIAGNOSIS — N920 Excessive and frequent menstruation with regular cycle: Secondary | ICD-10-CM

## 2018-06-15 DIAGNOSIS — M79604 Pain in right leg: Secondary | ICD-10-CM

## 2018-06-15 DIAGNOSIS — D509 Iron deficiency anemia, unspecified: Secondary | ICD-10-CM

## 2018-06-15 DIAGNOSIS — Z79899 Other long term (current) drug therapy: Secondary | ICD-10-CM

## 2018-06-15 DIAGNOSIS — M79606 Pain in leg, unspecified: Secondary | ICD-10-CM | POA: Diagnosis not present

## 2018-06-15 MED ORDER — NAPROXEN 500 MG PO TABS: 500.0000 mg | ORAL_TABLET | Freq: Two times a day (BID) | ORAL | 0 refills | Status: DC | PRN

## 2018-06-15 MED ORDER — VALSARTAN 160 MG PO TABS: 160.0000 mg | ORAL_TABLET | Freq: Every day | ORAL | 0 refills | Status: DC

## 2018-06-15 MED ORDER — AMLODIPINE BESYLATE 10 MG PO TABS: 5.0000 mg | ORAL_TABLET | Freq: Every day | ORAL | 0 refills | Status: DC

## 2018-06-15 MED ORDER — AMOXICILLIN-POT CLAVULANATE 875-125 MG PO TABS: 1.0000 | ORAL_TABLET | Freq: Two times a day (BID) | ORAL | 0 refills | Status: DC

## 2018-06-15 NOTE — Assessment & Plan Note (Signed)
Patient has a history of iron deficiency anemia, she was supposed to get a Feraheme injection her last visit however has been unable to schedule it due to scheduling issues.  She is currently having significant vaginal bleeding which may be contributing to her iron deficiency.  Vitals have been stable and she has been denying any symptomatic anemia.  Will repeat labs today and patient will still need to get the Feraheme.  Plan: -Check CBC, iron, IBC, and ferritin -Advised patient to schedule the Feraheme infusion

## 2018-06-15 NOTE — Assessment & Plan Note (Signed)
Patient has a history of vaginal bleeding that she has been having since February, she is currently using the super plus pad and requires changing it every 2 hours.  Patient also reports abdominal cramping and is requesting ibuprofen.  Patient is going to follow-up with the OB/GYN however there is an issue with the transportation and timing of the appointment so she was unable to get this.  She does not appear to have symptomatic anemia at this time, denies any lightheadedness, dizziness, fatigue, or generalized weakness.  Plan: -Check CBC -Advised patient to follow-up with the OB/GYN -We will give a small dose of naproxen for abdominal cramps as well as her other comorbidities

## 2018-06-15 NOTE — Assessment & Plan Note (Signed)
Patient has a history of bilateral lower extremity leg pain, and received Toradol in the past.  She reports that she still having occasional leg pain, the left is worse than the right, it is sharp in nature, continuous aching pain, and worse with movement.  Patient has had this since 2016 and has been diagnosed with neuropathy. She reports that she has been using Tylenol which she states does not help very much.  On exam she does have some tenderness in her left lower extremity, she has bilateral lower extremity swelling that is equal, pulses were normal.  Has a history of iron deficiency anemia, the low iron may be contributing to her symptoms, will need to have a ferritin goal of greater than 70.

## 2018-06-15 NOTE — Assessment & Plan Note (Signed)
Patient has been having frequent tooth infections, she had a telehealth visit on 4/17 they were concerned for a dental infection, given Augmentin 8 7598 twice daily for 7 days and Tylenol for pain control.  Today she reports that it has been feeling a little better, however about 1 to 2 days ago she started having worsening mouth pain, has a cracked tooth, some swelling, and is not currently draining anything.  She reported that she was try to follow-up with a dentist, however reports that they not excepting anybody until July due to the coronavirus.  She denied any fevers, chills, nausea, vomiting, difficulty swallowing, changes in voice, or shortness of breath.  On exam there is some tenderness to palpation on the right mandibular area, as well as a missing teeth and a small cracked tooth(see image on physical exam).  Unclear if she currently has an infection, however will give 1 more week of antibiotics to cover and advised patient to follow-up with the dentist.  Plan: -Augmentin 500 mg twice daily x1 week -Giving naproxen for her abdominal cramping, this may help with her teeth pain as well -Advised patient to follow-up with the dentist when able to

## 2018-06-15 NOTE — Progress Notes (Signed)
Internal Medicine Clinic Attending  Case discussed with Dr. Krienke at the time of the visit.  We reviewed the resident's history and exam and pertinent patient test results.  I agree with the assessment, diagnosis, and plan of care documented in the resident's note.    

## 2018-06-15 NOTE — Progress Notes (Signed)
   CC: Dental issues, bilateral leg pain, vaginal bleeding and cramps, and hypertension  HPI:  [name] is a 48 y.o.   Past Medical History:  Diagnosis Date  . Acid reflux   . Anemia    Iron Def  . Anorexia   . Chronic kidney disease    Nephrotic syndrome  . Colon polyp 2009  . Depression with anxiety   . Edema leg   . Fibromyalgia   . Hemorrhoids   . Hidradenitis suppurativa   . Hypertension   . IBS (irritable bowel syndrome)   . Low back pain   . Migraines   . Morbidly obese (Lindsay [name])   . Neuromuscular disorder (HCC)    fibromyalgia  . Neuropathy   . Panic attacks   . Polyp of vocal cord or larynx   . Tonsil pain    Review of Systems: Reports pain on the right side of her mouth, swelling, lower extremity leg pain, vaginal bleeding, and abdominal cramps.  Denies fevers, chills, nausea, vomiting, headaches, lightheadedness, dizziness, chest pain, shortness of breath, difficulty swallowing, or other symptoms.  Physical Exam:  Vitals:   06/15/18 0914  BP: (!) 159/93  Pulse: 93  Temp: 98.8 F (37.1 C)  TempSrc: Oral  SpO2: 98%  Weight: (!) 336 lb 1.6 oz (152.5 kg)  Height: 5\' 11"  (1.803 m)   Physical Exam  Constitutional: She is oriented to person, place, and time.  Tired appearing female, no acute distress  HENT:  Mouth/Throat: No oropharyngeal exudate.  Tender to palpation on right mandibular area, no swelling noted, multiple teeth missing with some cracked and damaged teeth, no obvious drainage noted  Neck: Normal range of motion. Neck supple.  Cardiovascular: Normal rate, regular rhythm and normal heart sounds.  Pulmonary/Chest: Effort normal and breath sounds normal. No respiratory distress.  Abdominal: Soft. Bowel sounds are normal.  Musculoskeletal:        General: Tenderness (TTP of left LE) and edema (1+ BL LE edema) present.  Lymphadenopathy:    She has no cervical adenopathy.  Neurological: She is alert and oriented to person, place, and time.   Skin: Skin is warm and dry.  Psychiatric: Mood and affect normal.         Assessment & Plan:   See Encounters Tab for problem based charting.  Patient discussed with Dr. Angelia Mould

## 2018-06-15 NOTE — Patient Instructions (Addendum)
Ms. Lavada Langsam,  It was a pleasure to see you today. Thank you for coming in.   Today we discussed your dental issues, your leg pain, cramping, and your blood pressure. Please make the following changes:  Follow up with the Dentist, I will send a message to the front desk to see if we can get you in earlier Start taking Augmentin 500 mg twice a day for 1 week  For your blood pressure: Stop taking Lasix 80 mg twice daily Decrease your amlodipine to 5 mg daily Continue taking hydrochlorothiazide 25 mg daily Start taking valsartan 160 mg daily Continue to check your blood pressure at home  In regards to your bleeding and cramping I have sent in a prescription for naproxen 500 mg twice daily, after this has completed you can alternate Tylenol and ibuprofen for your pain.  Please follow-up with the OB/GYN to further discuss this. Check some labs and will contact you with the results.  Please return to clinic in 1 month or sooner if needed.   Thank you again for coming in.   Asencion Noble.D.

## 2018-06-15 NOTE — Assessment & Plan Note (Signed)
Patient's vitamin B12 was low and she was started on B12 supplementation.  Repeating B12 levels at this time.

## 2018-06-15 NOTE — Assessment & Plan Note (Signed)
Blood pressure still uncontrolled at 159/93.  Patient is currently taking amlodipine 10 mg daily, hydrochlorothiazide 25 mg daily, and Lasix 80 mg twice daily.  She had been taken off her losartan due to possible connection to her cough, patient reports that this was not the cause of the cough and did not think that they needed. She denied any symptoms of fluid overload including shortness of breath, orthopnea, or changes in her weight. On exam she does have bilateral lower extremity edema, however lungs were clear to auscultation, and she had no noted JVD.  We will stop her Lasix today and start her on valsartan 160 mg daily.  We will also decrease her of amlodipine from 10 mg to 5 mg to decrease the risk of developing lower extremity edema.  Plan -Discontinue Lasix -Continue hydrochlorothiazide 25 mg daily -Decrease amlodipine from 10 mg to 5 mg daily to decrease risk of lower extremity edema -Add valsartan 160 mg daily -Return to clinic in 1 month to follow-up blood pressure, if well controlled can switch to [place] for better compliance

## 2018-06-16 LAB — BMP8+ANION GAP
Anion Gap: 14 mmol/L (ref 10.0–18.0)
BUN/Creatinine Ratio: 10 (ref 9–23)
BUN: 6 mg/dL (ref 6–24)
CO2: 23 mmol/L (ref 20–29)
Calcium: 8.9 mg/dL (ref 8.7–10.2)
Chloride: 104 mmol/L (ref 96–106)
Creatinine, Ser: 0.6 mg/dL (ref 0.57–1.00)
GFR calc Af Amer: 125 mL/min/{1.73_m2} (ref >59)
GFR calc non Af Amer: 108 mL/min/{1.73_m2} (ref >59)
Glucose: 76 mg/dL (ref 65–99)
Potassium: 4 mmol/L (ref 3.5–5.2)
Sodium: 141 mmol/L (ref 134–144)

## 2018-06-16 LAB — CBC
Hematocrit: 40.7 % (ref 34.0–46.6)
Hemoglobin: 11.9 g/dL (ref 11.1–15.9)
MCH: 21.8 pg — ABNORMAL LOW (ref 26.6–33.0)
MCHC: 29.2 g/dL — ABNORMAL LOW (ref 31.5–35.7)
MCV: 74 fL — ABNORMAL LOW (ref 79–97)
Platelets: 486 10*3/uL — ABNORMAL HIGH (ref 150–450)
RBC: 5.47 x10E6/uL — ABNORMAL HIGH (ref 3.77–5.28)
RDW: 18.1 % — ABNORMAL HIGH (ref 11.7–15.4)
WBC: 8.1 10*3/uL (ref 3.4–10.8)

## 2018-06-16 LAB — IRON AND TIBC
Iron Saturation: 5 % — CL (ref 15–55)
Iron: 21 ug/dL — ABNORMAL LOW (ref 27–159)
Total Iron Binding Capacity: 385 ug/dL (ref 250–450)
UIBC: 364 ug/dL (ref 131–425)

## 2018-06-16 LAB — FERRITIN: Ferritin: 20 ng/mL (ref 15–150)

## 2018-06-16 LAB — VITAMIN B12: Vitamin B-12: 231 pg/mL — ABNORMAL LOW (ref 232–1245)

## 2018-06-16 LAB — VITAMIN D 25 HYDROXY (VIT D DEFICIENCY, FRACTURES): Vit D, 25-Hydroxy: 16.5 ng/mL — ABNORMAL LOW (ref 30.0–100.0)

## 2018-06-17 ENCOUNTER — Telehealth: Payer: Self-pay | Admitting: Internal Medicine

## 2018-06-17 ENCOUNTER — Other Ambulatory Visit: Payer: Self-pay | Admitting: *Deleted

## 2018-06-17 DIAGNOSIS — E611 Iron deficiency: Secondary | ICD-10-CM

## 2018-06-17 MED ORDER — SODIUM CHLORIDE 0.9 % IV SOLN: 510.0000 mg | INTRAVENOUS | Status: AC

## 2018-06-17 NOTE — Progress Notes (Signed)
Signed. Thank you.

## 2018-06-18 ENCOUNTER — Telehealth: Payer: Self-pay | Admitting: *Deleted

## 2018-06-18 NOTE — Telephone Encounter (Signed)
Pt was called / informed order for feraheme infusion has been placed and to call LaVerne, short stay at [facility], to schedule an appt. [phone]). Pt requesting recent lab results - sending her request to her doctor.. Thanks

## 2018-06-22 ENCOUNTER — Telehealth: Payer: Self-pay | Admitting: Internal Medicine

## 2018-06-22 NOTE — Telephone Encounter (Signed)
Calls in after a fall at home.  She reports she was getting something out of a storage building stepdown into some mild and slipped fell on her back.  She did not hit her head.  After the fall she reports some knee pain in her right knee.  She is still able to ambulate but now the knee is painful.  She is currently treating it with RICE therapy and NSAIDs.  She feels it has not improved very much.  She was hoping to get an appointment in the clinic today but it was closed for [facility] Day.  I explained may be she could go to an urgent care center for evaluation but she reports that she has no transportation.  She then asked about Tylenol with codeine, I explained that if she does not have transportation how will she get the Tylenol with codeine and she replied "I see what you are saying".    -I told her I would let her primary care provider know in the morning and that maybe we can get an appointment tomorrow morning.  She agreed but said that if her pain does not improve she may call 911 -I advised her to continue over the counter NSAIDs and RICE therapy

## 2018-06-23 NOTE — Telephone Encounter (Signed)
F/U call - stated she has not call to schedule feraheme appt; stated she does not have the # which I gave to her. Then she stated it must be in her phone. But she will call today ; she has an [facility] appt tomorrow b/c she had a recent fall.

## 2018-06-24 ENCOUNTER — Other Ambulatory Visit: Payer: Self-pay

## 2018-06-24 ENCOUNTER — Emergency Department (HOSPITAL_COMMUNITY): Payer: Medicare Other

## 2018-06-24 ENCOUNTER — Emergency Department (HOSPITAL_COMMUNITY)
Admission: EM | Admit: 2018-06-24 | Discharge: 2018-06-24 | Disposition: A | Discharge status: Home / Self Care | Payer: Medicare Other | Attending: Emergency Medicine | Admitting: Emergency Medicine

## 2018-06-24 ENCOUNTER — Ambulatory Visit: Payer: Medicare Other

## 2018-06-24 ENCOUNTER — Encounter (HOSPITAL_COMMUNITY): Payer: Self-pay | Admitting: Emergency Medicine

## 2018-06-24 ENCOUNTER — Other Ambulatory Visit: Payer: Self-pay | Admitting: Internal Medicine

## 2018-06-24 DIAGNOSIS — M79604 Pain in right leg: Secondary | ICD-10-CM

## 2018-06-24 DIAGNOSIS — M7918 Myalgia, other site: Secondary | ICD-10-CM | POA: Diagnosis not present

## 2018-06-24 DIAGNOSIS — N189 Chronic kidney disease, unspecified: Secondary | ICD-10-CM | POA: Diagnosis not present

## 2018-06-24 DIAGNOSIS — I129 Hypertensive chronic kidney disease with stage 1 through stage 4 chronic kidney disease, or unspecified chronic kidney disease: Secondary | ICD-10-CM | POA: Insufficient documentation

## 2018-06-24 DIAGNOSIS — S299XXA Unspecified injury of thorax, initial encounter: Secondary | ICD-10-CM | POA: Diagnosis not present

## 2018-06-24 DIAGNOSIS — R079 Chest pain, unspecified: Secondary | ICD-10-CM | POA: Diagnosis not present

## 2018-06-24 DIAGNOSIS — M549 Dorsalgia, unspecified: Secondary | ICD-10-CM | POA: Diagnosis not present

## 2018-06-24 DIAGNOSIS — F17210 Nicotine dependence, cigarettes, uncomplicated: Secondary | ICD-10-CM | POA: Diagnosis not present

## 2018-06-24 DIAGNOSIS — Z79899 Other long term (current) drug therapy: Secondary | ICD-10-CM | POA: Insufficient documentation

## 2018-06-24 DIAGNOSIS — S199XXA Unspecified injury of neck, initial encounter: Secondary | ICD-10-CM | POA: Diagnosis not present

## 2018-06-24 DIAGNOSIS — M542 Cervicalgia: Secondary | ICD-10-CM | POA: Diagnosis present

## 2018-06-24 DIAGNOSIS — M797 Fibromyalgia: Secondary | ICD-10-CM

## 2018-06-24 DIAGNOSIS — M79605 Pain in left leg: Secondary | ICD-10-CM

## 2018-06-24 LAB — BASIC METABOLIC PANEL
Anion gap: 14 (ref 5–15)
BUN: 8 mg/dL (ref 6–20)
CO2: 23 mmol/L (ref 22–32)
Calcium: 8.9 mg/dL (ref 8.9–10.3)
Chloride: 104 mmol/L (ref 98–111)
Creatinine, Ser: 0.81 mg/dL (ref 0.44–1.00)
GFR calc Af Amer: >60 mL/min (ref >60)
GFR calc non Af Amer: >60 mL/min (ref >60)
Glucose, Bld: 83 mg/dL (ref 70–99)
Potassium: 3.7 mmol/L (ref 3.5–5.1)
Sodium: 141 mmol/L (ref 135–145)

## 2018-06-24 LAB — TROPONIN I: Troponin I: <0.03 ng/mL (ref <0.03)

## 2018-06-24 LAB — I-STAT BETA HCG BLOOD, ED (MC, WL, AP ONLY): I-stat hCG, quantitative: <5 m[IU]/mL (ref <5)

## 2018-06-24 LAB — CBC
HCT: 38.1 % (ref 36.0–46.0)
Hemoglobin: 11.3 g/dL — ABNORMAL LOW (ref 12.0–15.0)
MCH: 21.7 pg — ABNORMAL LOW (ref 26.0–34.0)
MCHC: 29.7 g/dL — ABNORMAL LOW (ref 30.0–36.0)
MCV: 73.1 fL — ABNORMAL LOW (ref 80.0–100.0)
Platelets: 504 10*3/uL — ABNORMAL HIGH (ref 150–400)
RBC: 5.21 MIL/uL — ABNORMAL HIGH (ref 3.87–5.11)
RDW: 19.8 % — ABNORMAL HIGH (ref 11.5–15.5)
WBC: 10.2 10*3/uL (ref 4.0–10.5)
nRBC: 0 % (ref 0.0–0.2)

## 2018-06-24 IMAGING — DX THORACIC SPINE 2 VIEWS
2 series · 2 of 2 positions shown · non-contrast
Comparison: Chest CT [DATE]

CLINICAL DATA: Fall yesterday with back pain.  Initial encounter.

EXAM:
THORACIC SPINE 2 VIEWS

[t-spine ap]
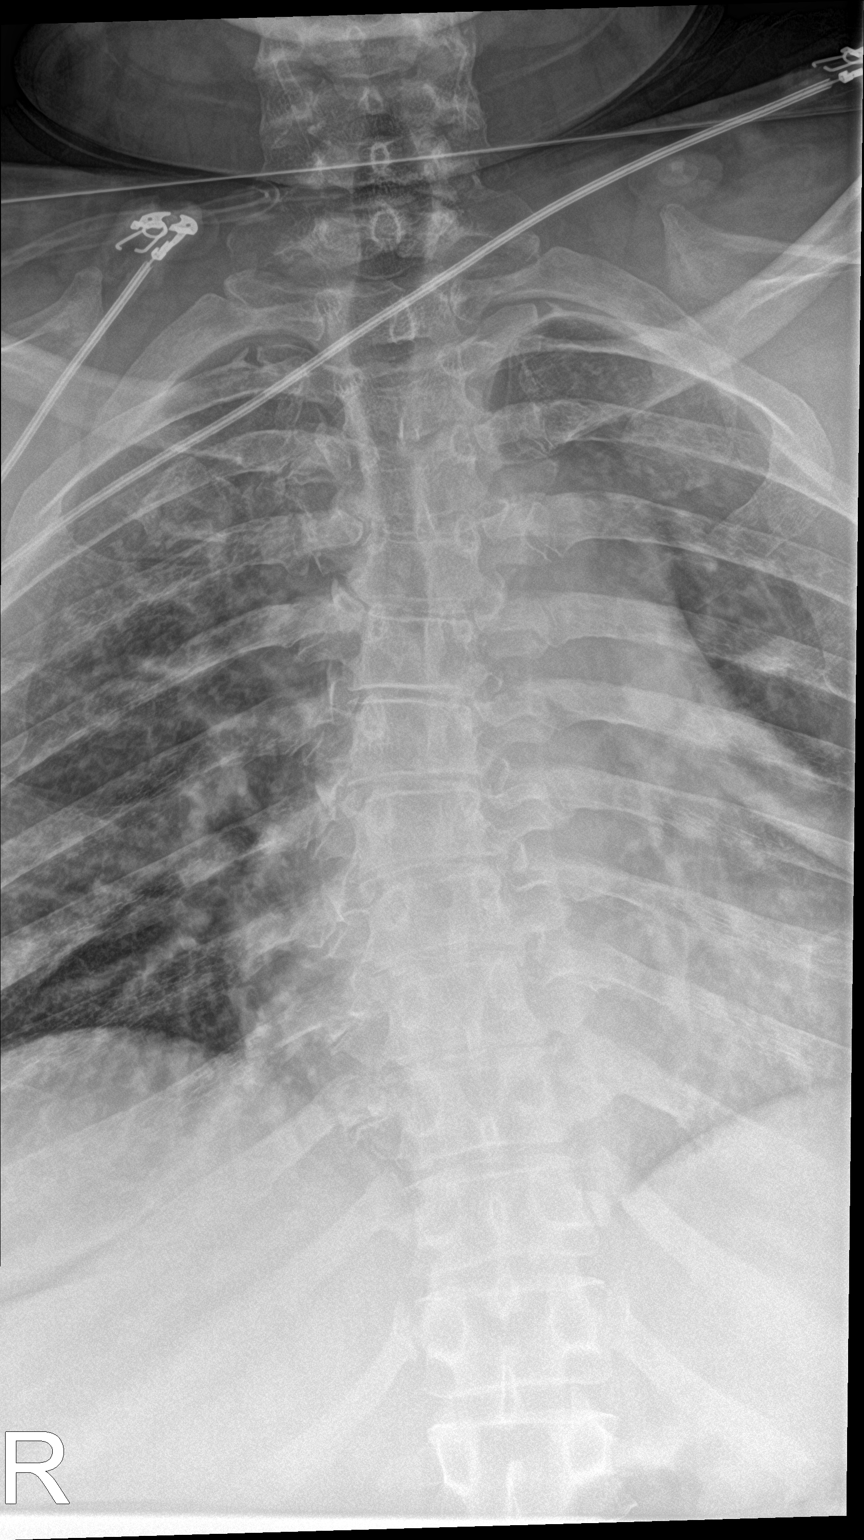

[t-spine lat]
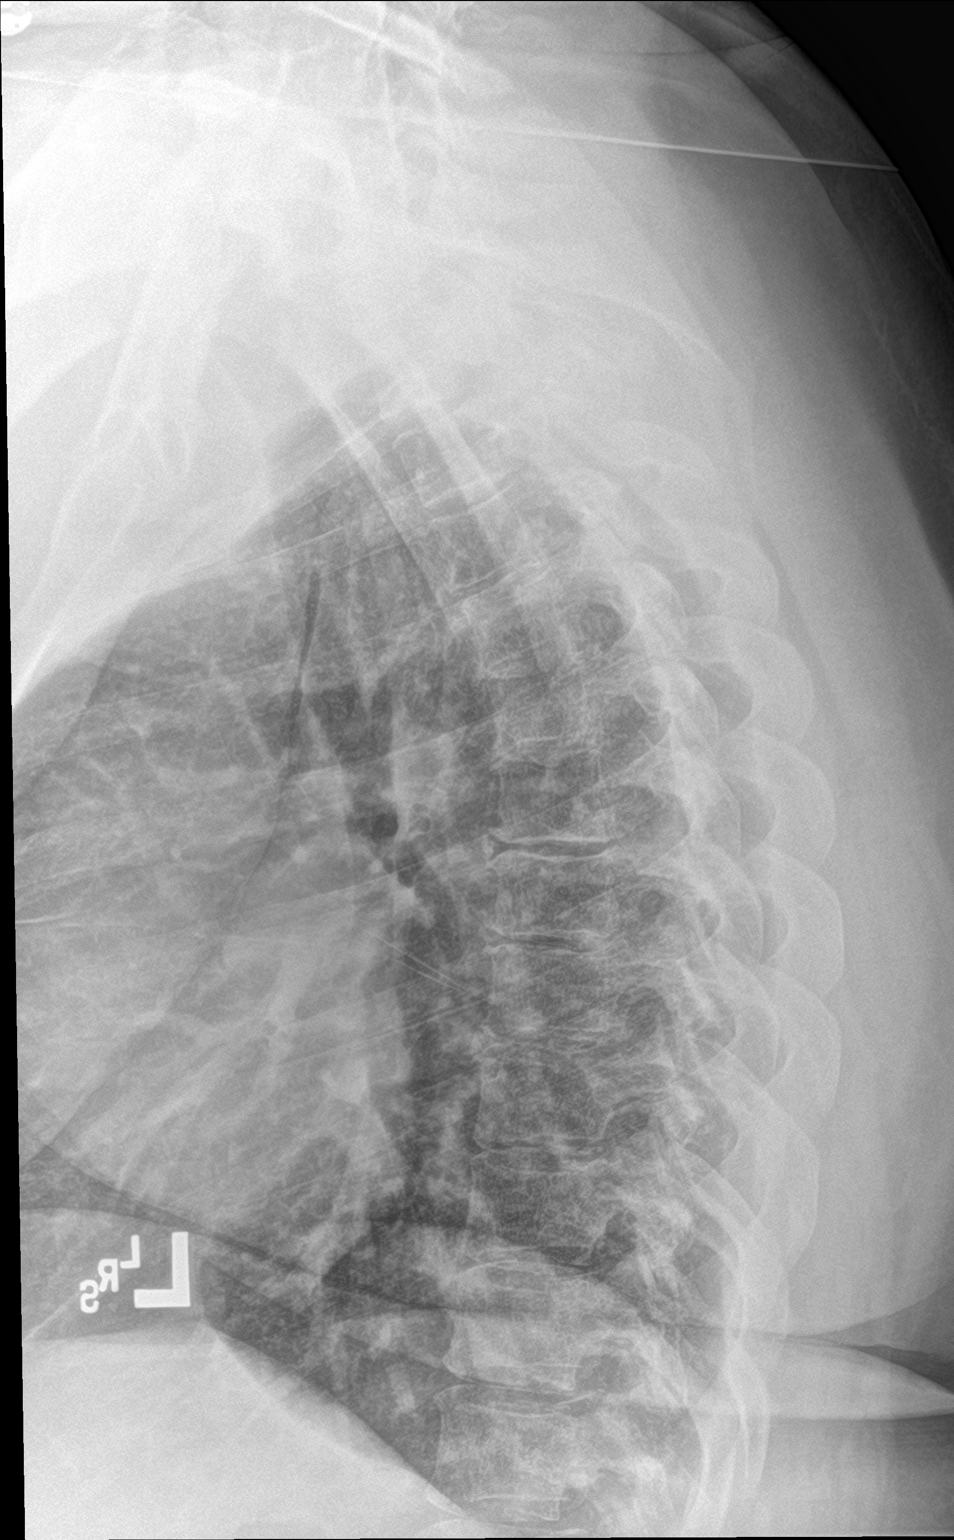

[2 of 2 positions shown; findings below may reference images not displayed]

FINDINGS: No evidence of fracture or traumatic malalignment. Mild scoliosis
with generalized disc narrowing and endplate spurring, stable.
Maintained posterior mediastinal fat planes.
IMPRESSION: 1. No acute finding.
2. Spondylosis and disc narrowing.  Mild scoliosis.

## 2018-06-24 IMAGING — DX CHEST - 2 VIEW
2 series · 2 of 2 positions shown · non-contrast
Comparison: [DATE]

CLINICAL DATA: Chest pain

EXAM:
CHEST - 2 VIEW

[chest lat]
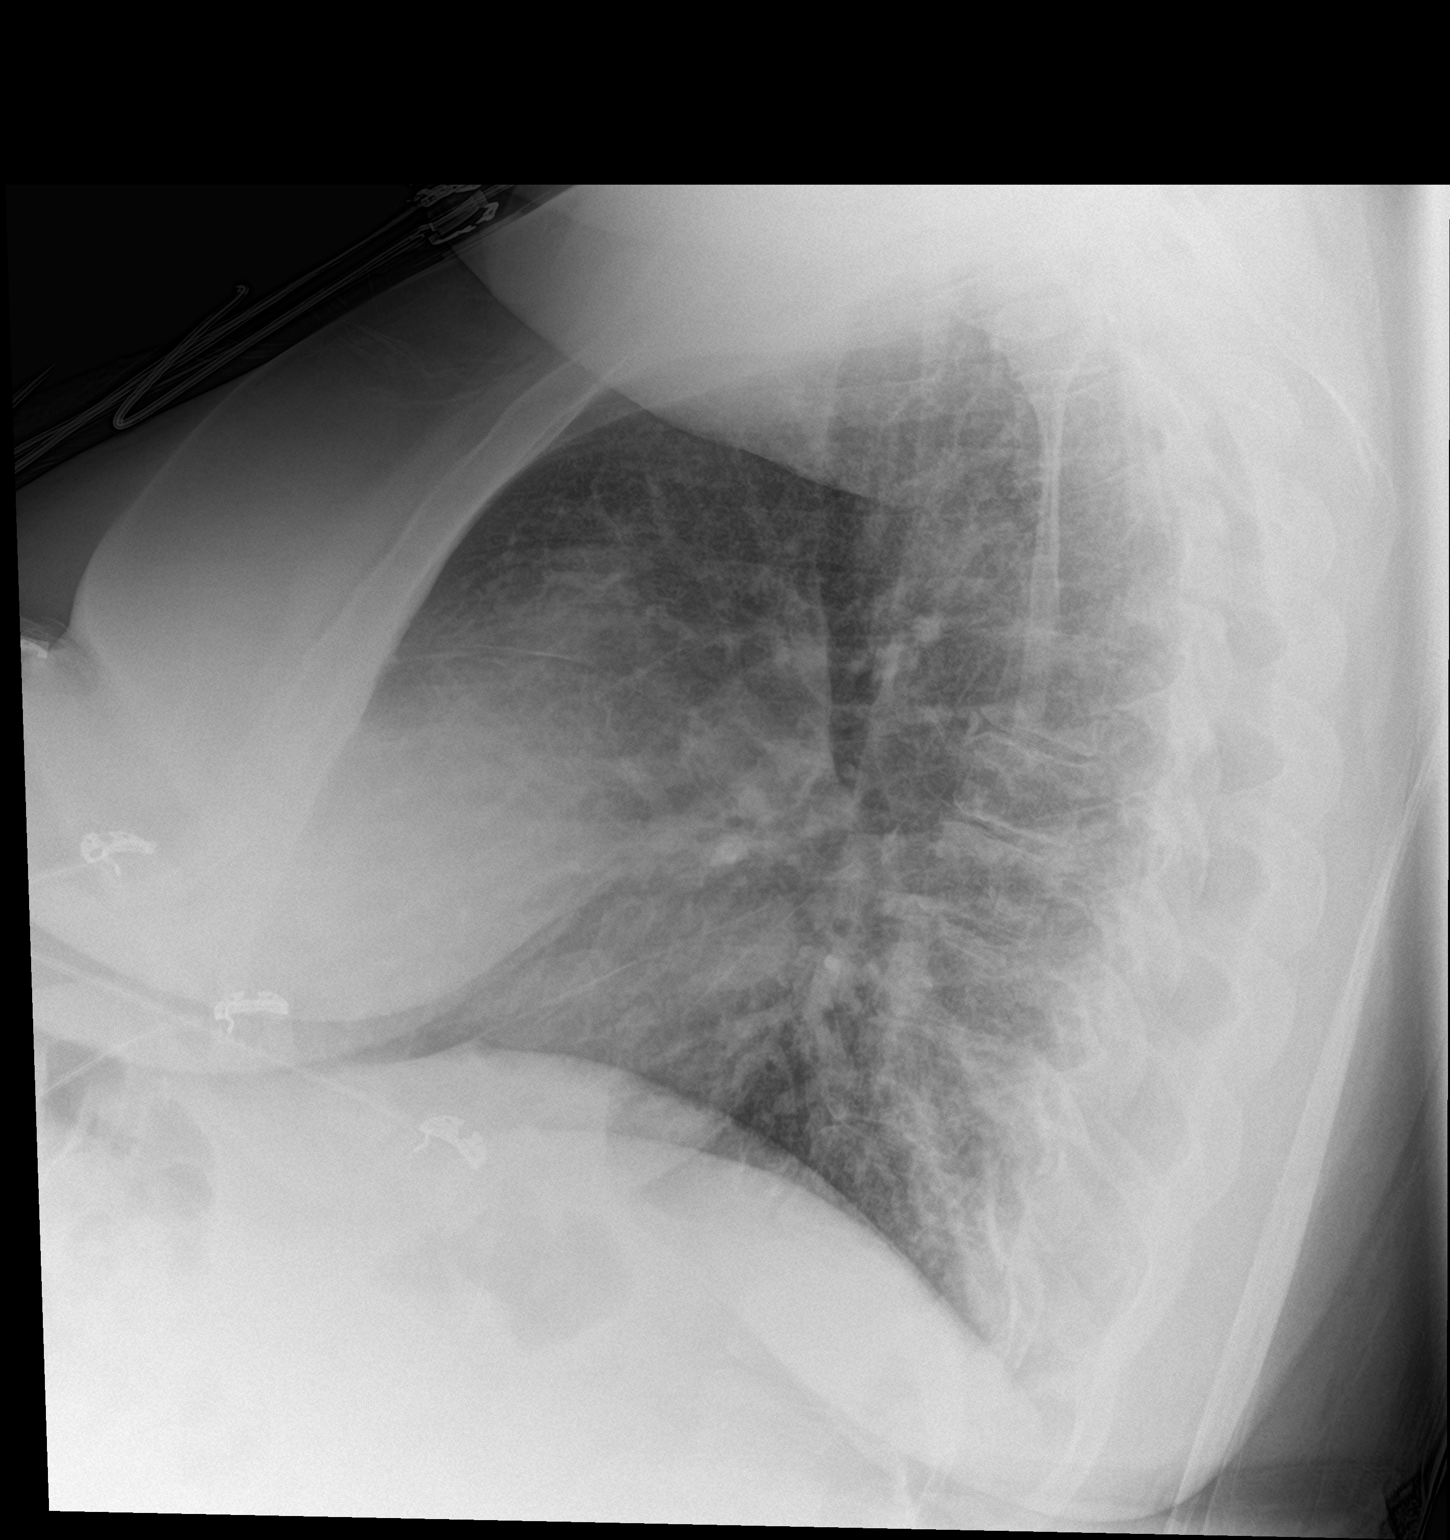

[chest ap]
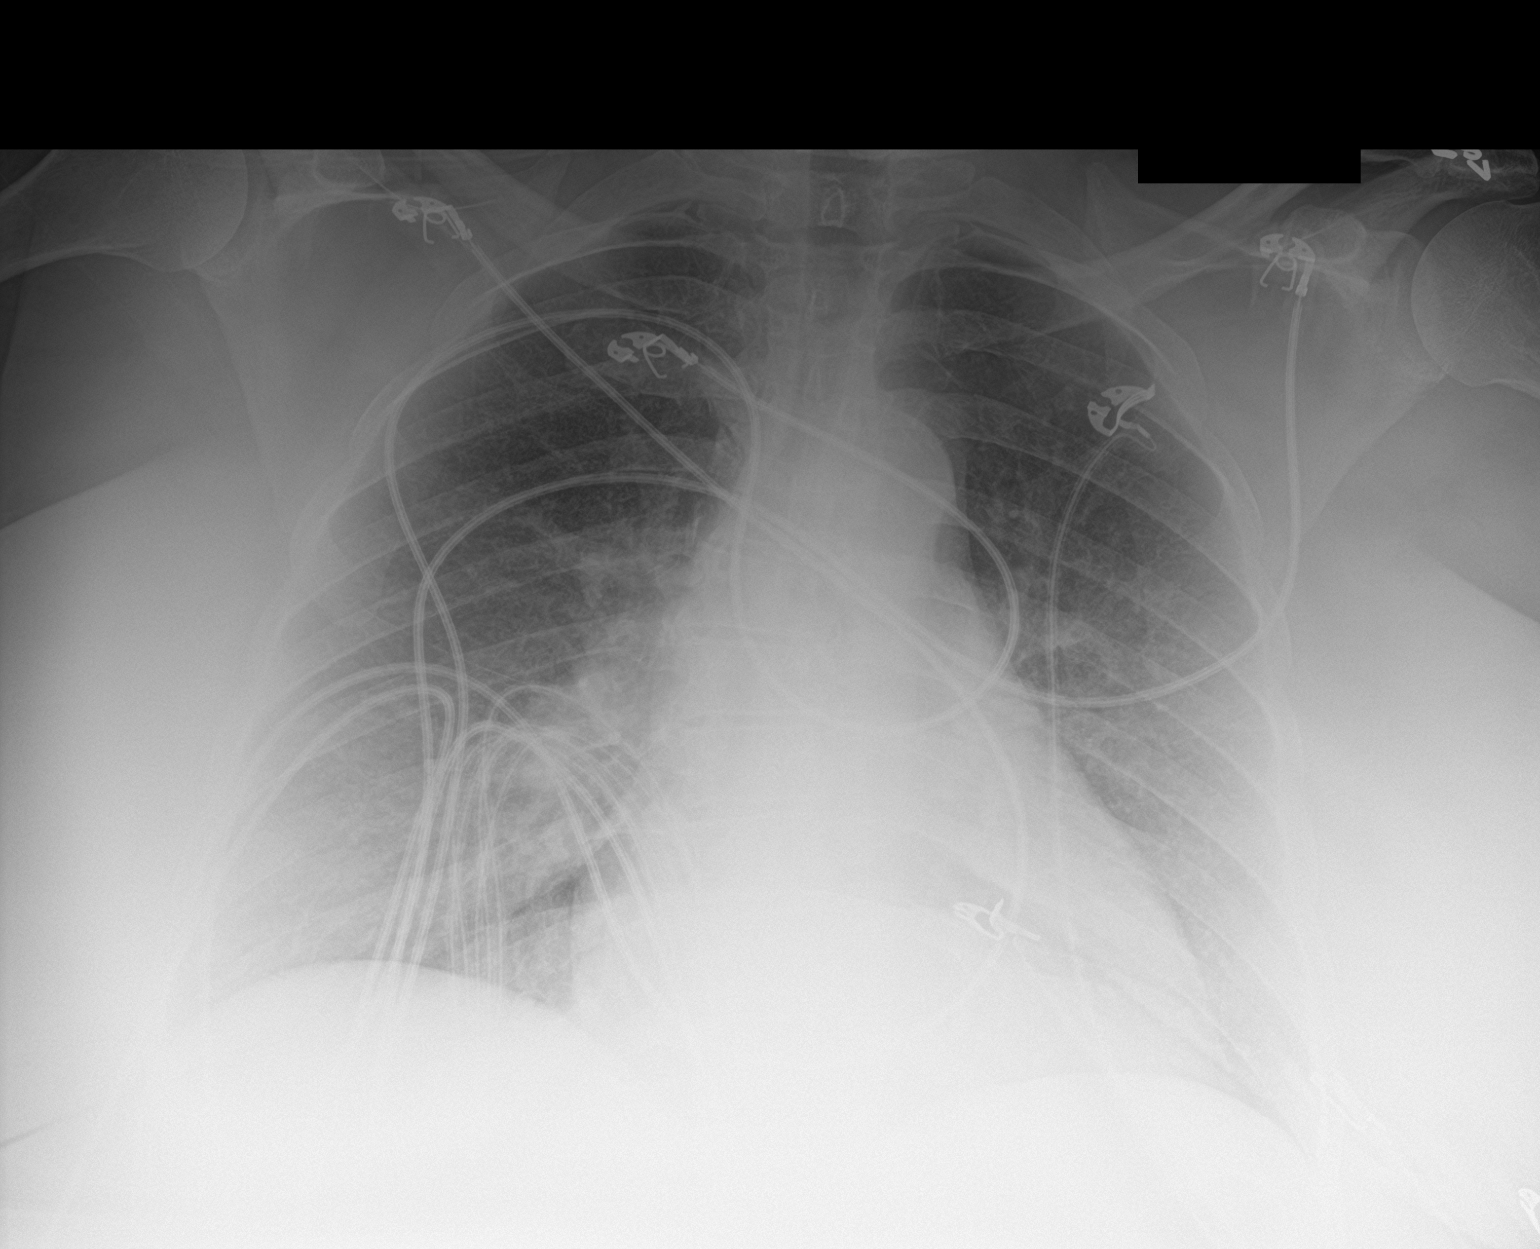

[2 of 2 positions shown; findings below may reference images not displayed]

FINDINGS: Cardiac shadows within normal limits. The lungs are well aerated
bilaterally. No focal infiltrate or effusion is seen. No acute bony
abnormality is noted.
IMPRESSION: No acute abnormality noted.

## 2018-06-24 IMAGING — DX CERVICAL SPINE - COMPLETE 4+ VIEW
6 series · 6 of 6 positions shown · non-contrast
Comparison: Neck CT [DATE]

CLINICAL DATA: Fall with back pain.

EXAM:
CERVICAL SPINE - COMPLETE 4+ VIEW

[c-spine lat]
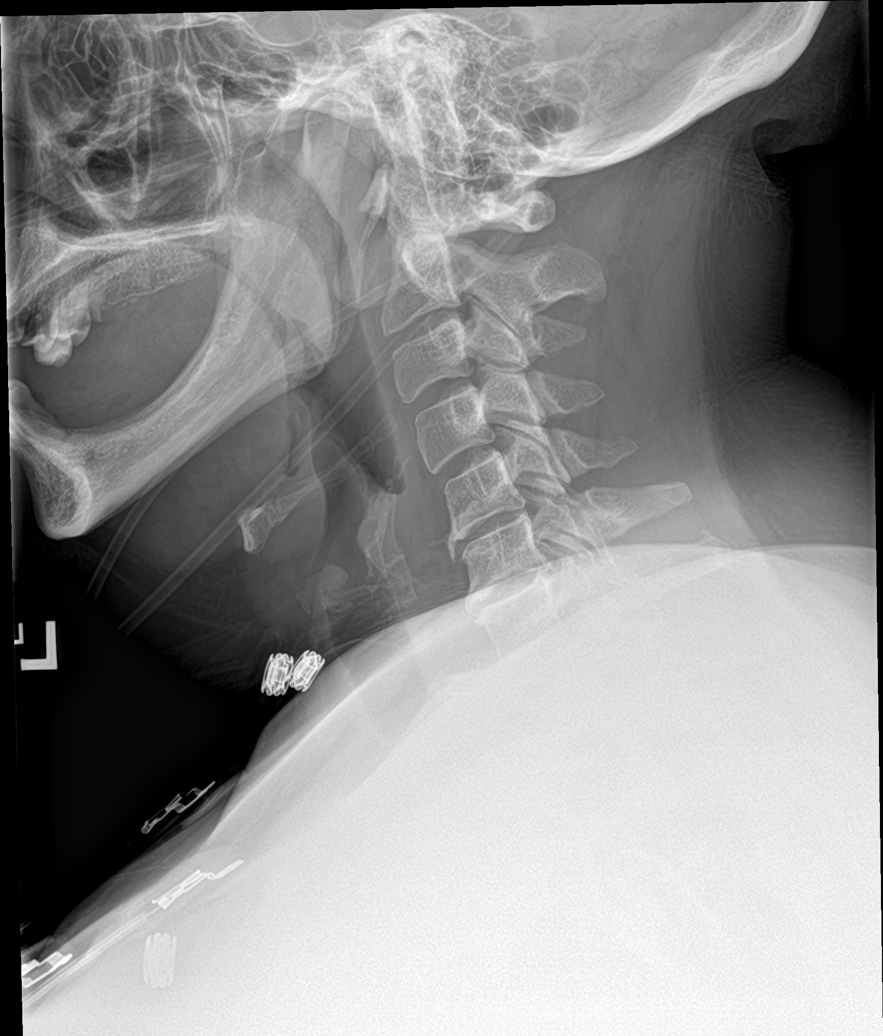

[c-spine obl (1 of 2)]
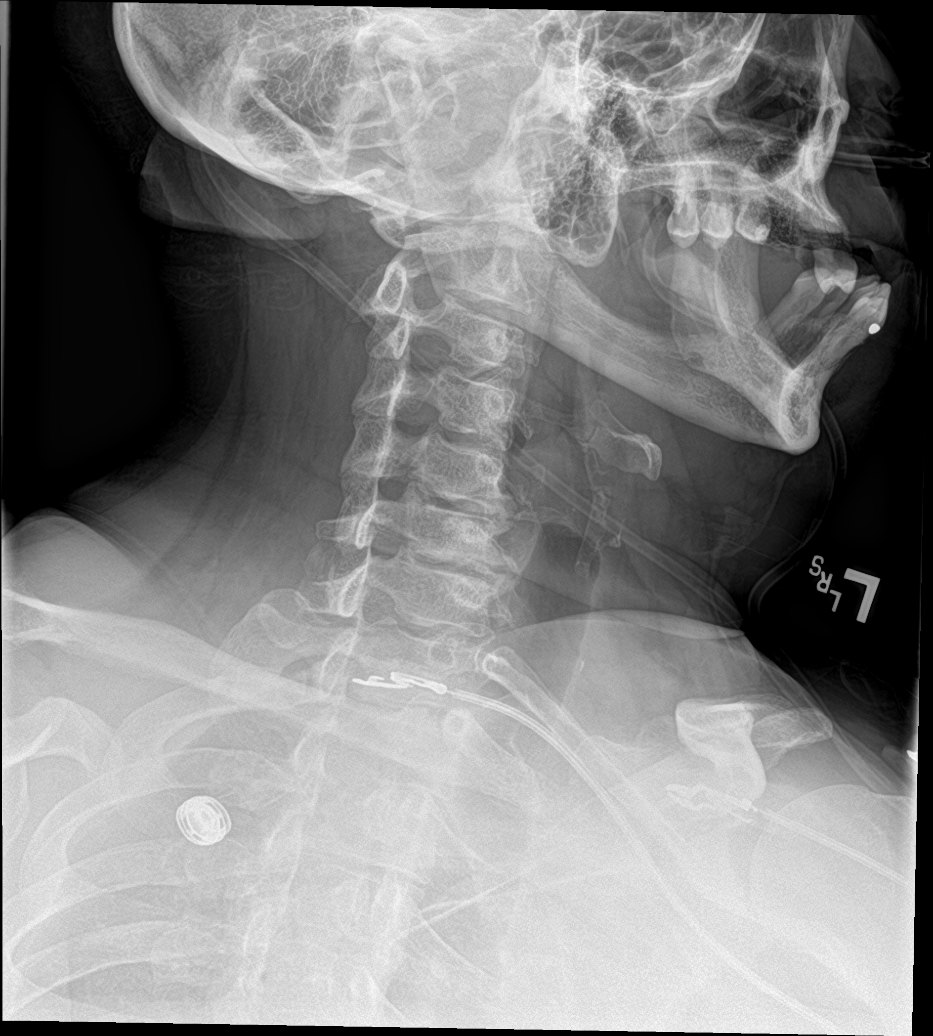

[c-spine obl (2 of 2)]
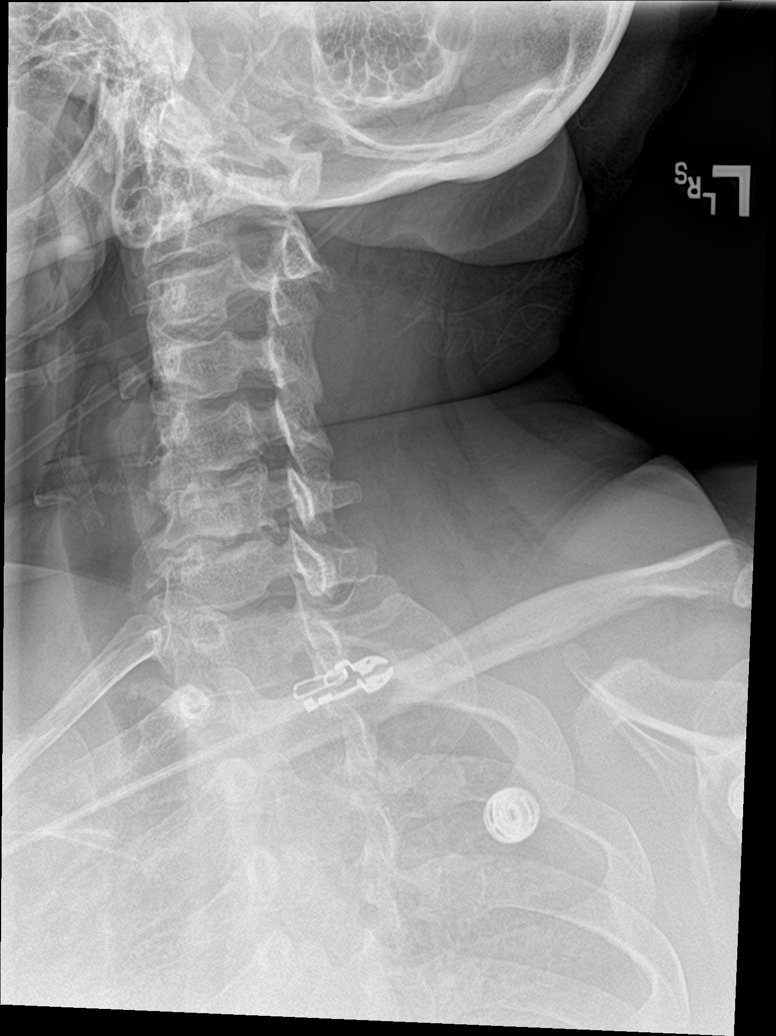

[c-spine ap]
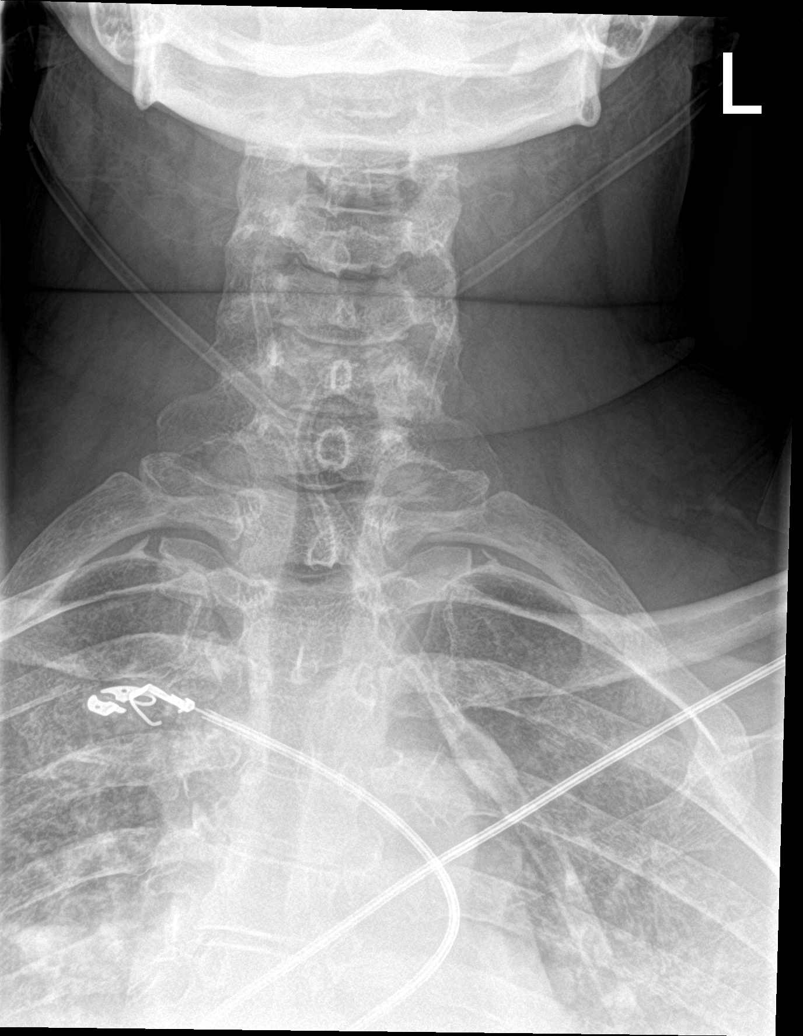

[c-spine open mouth]
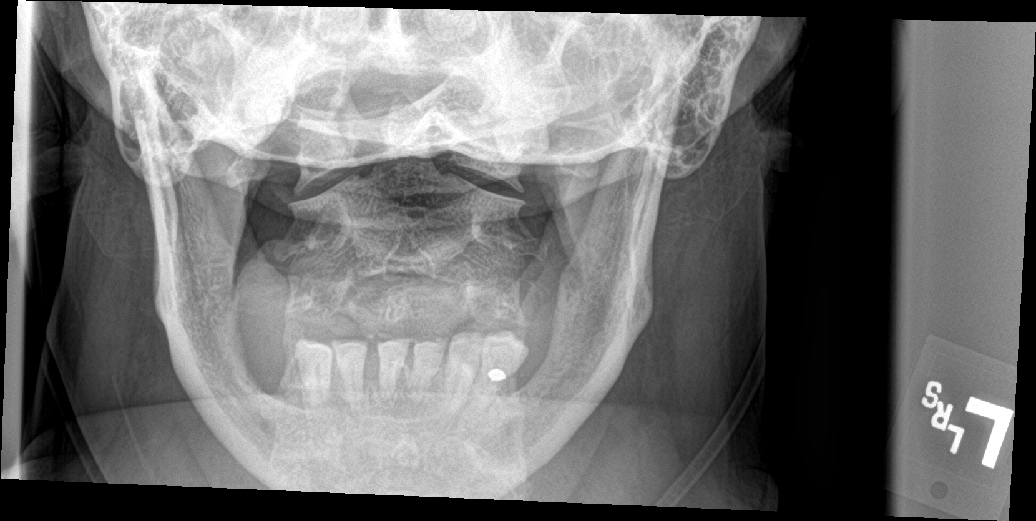

[c-spine swimmers]
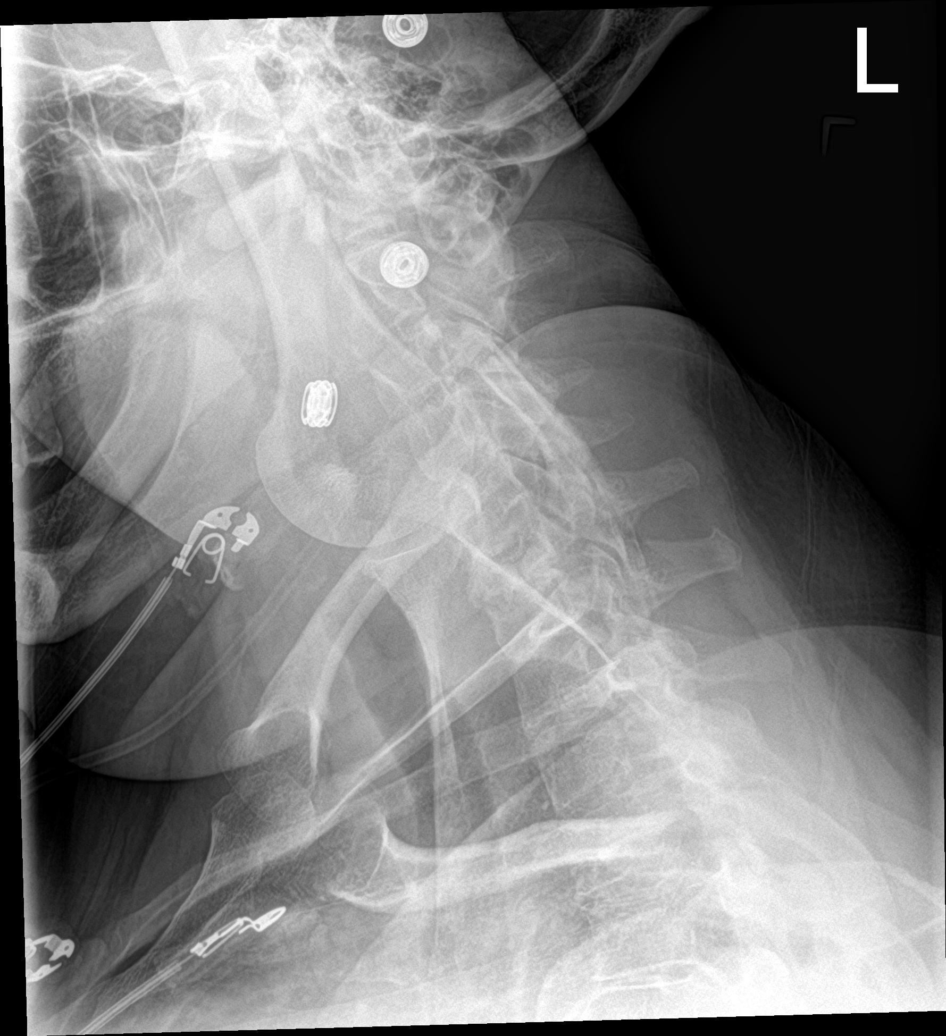

[6 of 6 positions shown; findings below may reference images not displayed]

FINDINGS: There is no evidence of cervical spine fracture or prevertebral soft
tissue swelling. Left C6-7 uncovertebral spur and foraminal
narrowing.
IMPRESSION: No evidence of cervical spine injury.

## 2018-06-24 MED ORDER — OXYCODONE HCL 5 MG PO TABS
5.0000 mg | ORAL_TABLET | Freq: Once | ORAL | Status: AC
  Administered 2018-06-24: 5 mg via ORAL
  Filled 2018-06-24: qty 1

## 2018-06-24 MED ORDER — SODIUM CHLORIDE 0.9% FLUSH: 3.0000 mL | Freq: Once | INTRAVENOUS | Status: DC

## 2018-06-24 MED ORDER — METHOCARBAMOL 500 MG PO TABS
1000.0000 mg | ORAL_TABLET | Freq: Once | ORAL | Status: AC
  Administered 2018-06-24: 04:00:00 1000 mg via ORAL
  Filled 2018-06-24: qty 2

## 2018-06-24 MED ORDER — METHOCARBAMOL 500 MG PO TABS: 1000.0000 mg | ORAL_TABLET | Freq: Four times a day (QID) | ORAL | 0 refills | Status: DC

## 2018-06-24 NOTE — Discharge Instructions (Signed)
Please read and follow all provided instructions.  Your diagnoses today include:  1. Musculoskeletal pain     Tests performed today include:  An EKG of your heart  A chest x-ray  Cardiac enzymes - a blood test for heart muscle damage  Blood counts and electrolytes  Vital signs. See below for your results today.   Medications prescribed:   Robaxin (methocarbamol) - muscle relaxer medication  DO NOT drive or perform any activities that require you to be awake and alert because this medicine can make you drowsy.   Take any prescribed medications only as directed.  Follow-up instructions: Please follow-up with your primary care provider as soon as you can for further evaluation of your symptoms.   Return instructions:  SEEK IMMEDIATE MEDICAL ATTENTION IF:  You have severe chest pain, especially if the pain is crushing or pressure-like and spreads to the arms, back, neck, or jaw, or if you have sweating, nausea (feeling sick to your stomach), or shortness of breath. THIS IS AN EMERGENCY. Don't wait to see if the pain will go away. Get medical help at once. Call 911 or 0 (operator). DO NOT drive yourself to the hospital.   Your chest pain gets worse and does not go away with rest.   You have an attack of chest pain lasting longer than usual, despite rest and treatment with the medications your caregiver has prescribed.   You wake from sleep with chest pain or shortness of breath.  You feel dizzy or faint.  You have chest pain not typical of your usual pain for which you originally saw your caregiver.   You have any other emergent concerns regarding your health.  Additional Information: Chest pain comes from many different causes. Your caregiver has diagnosed you as having chest pain that is not specific for one problem, but does not require admission.  You are at low risk for an acute heart condition or other serious illness.   Your vital signs today were: BP (!) 146/98     Pulse 88    Temp 98.5 F (36.9 C) (Oral)    Resp (!) 23    Ht 5\' 11"  (1.803 m)    Wt (!) 152.5 kg    SpO2 100%    BMI 46.89 kg/m  If your blood pressure (BP) was elevated above 135/85 this visit, please have this repeated by your doctor within one month. --------------

## 2018-06-24 NOTE — ED Notes (Signed)
Patient verbalizes understanding of discharge instructions. Opportunity for questioning and answers were provided. Armband removed by staff, pt discharged from ED.  

## 2018-06-24 NOTE — ED Notes (Signed)
Ambulated pt in hall, pt.tolerated well and O2 sats were 100 consistent at 3 L

## 2018-06-24 NOTE — ED Provider Notes (Signed)
[facility] EMERGENCY DEPARTMENT Provider Note   CSN: [id number] Arrival date & time: 06/24/18  0215    History   Chief Complaint Chief Complaint  Patient presents with  . Chest Pain    HPI Lindsay [name] is a 48 y.o. female.     Patient with history of fibromyalgia --presents the emergency department after a fall.  Patient states that the fall occurred 2 days ago.  She tripped and her legs went out from under her.  She fell onto her buttocks and onto her upper back.  She also twisted her knee.  Patient's main complaint tonight is neck pain, upper back pain, and chest pain.  Over-the-counter medications at home have not been helping.  She states that she could not wait any longer to go see her primary care doctor.  She has contacted the office for an appointment.  She denies any numbness, tingling, weakness in her legs. Patient denies warning symptoms of back pain including: fecal incontinence, urinary retention or overflow incontinence.  Pain is worse with movement and palpation.  Pain is gradually getting worse.  Onset of symptoms acute.  Course is constant.      Past Medical History:  Diagnosis Date  . Acid reflux   . Anemia    Iron Def  . Anorexia   . Chronic kidney disease    Nephrotic syndrome  . Colon polyp 2009  . Depression with anxiety   . Edema leg   . Fibromyalgia   . Hemorrhoids   . Hidradenitis suppurativa   . Hypertension   . IBS (irritable bowel syndrome)   . Low back pain   . Migraines   . Morbidly obese ([place])   . Neuromuscular disorder (HCC)    fibromyalgia  . Neuropathy   . Panic attacks   . Polyp of vocal cord or larynx   . Tonsil pain     Patient Active Problem List   Diagnosis Date Noted  . Acute respiratory failure with hypoxia ([place]) 06/15/2018  . Frequent urination at night 04/23/2018  . Chronic cough 03/04/2018  . Dental infection 12/05/2017  . Right arm pain 12/05/2017  . Chronic respiratory failure with hypoxia  ([place])   . Low serum vitamin B12 05/02/2017  . Palpitations 04/11/2017  . Iron deficiency 06/17/2016  . Allergic rhinitis 06/12/2016  . Vitamin D deficiency, unspecified 06/12/2016  . Nicotine dependence 06/12/2016  . Menorrhagia 01/08/2016  . Migraines 01/08/2016  . Bilateral leg pain 11/29/2015  . Hemorrhoids 07/05/2015  . Fibromyalgia 07/05/2015  . Chronic leg pain 09/26/2014  . Healthcare maintenance 09/26/2014  . Morbid obesity ([place]) 07/04/2014  . Major depressive disorder, recurrent episode, moderate ([place]) 10/13/2012  . Generalized anxiety disorder 10/13/2012  . Chronic nausea 05/10/2010  . IBS (irritable bowel syndrome) 06/16/2007  . Essential hypertension 02/06/2006  . HIDRADENITIS 11/18/2005    Past Surgical History:  Procedure Laterality Date  . AXILLARY HIDRADENITIS EXCISION    . COLONOSCOPY WITH PROPOFOL N/A 05/25/2015   Procedure: COLONOSCOPY WITH PROPOFOL;  Surgeon: Milus Banister, MD;  Location: [facility];  Service: Endoscopy;  Laterality: N/A;  . HEMORRHOID SURGERY     with Hidradenitis surgery   . INGUINAL HIDRADENITIS EXCISION    . TONSILLECTOMY  10/18/2010   by Dr. Wilburn Cornelia  . UPPER GASTROINTESTINAL ENDOSCOPY       OB History    Gravida  2   Para  1   Term      Preterm  AB  1   Living        SAB      TAB  1   Ectopic      Multiple      Live Births               Home Medications    Prior to Admission medications   Medication Sig Start Date End Date Taking? Authorizing Provider  albuterol (PROVENTIL HFA;VENTOLIN HFA) 108 (90 Base) MCG/ACT inhaler Inhale 2 puffs into the lungs every 6 (six) hours as needed for wheezing or shortness of breath. 11/09/17   Ina Homes, MD  albuterol (PROVENTIL) (2.5 MG/3ML) 0.083% nebulizer solution Take 3 mLs (2.5 mg total) by nebulization every 4 (four) hours. 11/09/17   Ina Homes, MD  amLODipine (NORVASC) 10 MG tablet Take 0.5 tablets (5 mg total) by mouth daily. 06/15/18 09/13/18   Asencion Noble, MD  amoxicillin-clavulanate (AUGMENTIN) 875-125 MG tablet Take 1 tablet by mouth 2 (two) times daily. 06/15/18   Asencion Noble, MD  benzocaine (ORAJEL) 10 % mucosal gel Use as directed 1 application in the mouth or throat 3 (three) times daily as needed for mouth pain. 04/29/18   Velna Ochs, MD  benzonatate (TESSALON PERLES) 100 MG capsule Take 1 capsule (100 mg total) by mouth every 6 (six) hours as needed for cough. 03/18/18 03/18/19  Velna Ochs, MD  Brexpiprazole (REXULTI) 3 MG TABS Take 3 mg by mouth daily. 04/29/18   [name], Arlyce Harman, MD  budesonide-formoterol (SYMBICORT) 160-4.5 MCG/ACT inhaler Inhale 2 puffs into the lungs 2 (two) times daily. 04/23/18   Chundi, Verne Spurr, MD  cefdinir (OMNICEF) 300 MG capsule Take 1 capsule (300 mg total) by mouth 2 (two) times daily. 03/16/18   Velna Ochs, MD  cholecalciferol (VITAMIN D) 1000 units tablet Take 1 tablet (1,000 Units total) by mouth daily. 05/06/17   Velna Ochs, MD  clorazepate (TRANXENE) 15 MG tablet TAKE 1 TABLET BY MOUTH EVERY DAY IN THE MORNING 04/29/18   [name], Arlyce Harman, MD  DULoxetine (CYMBALTA) 60 MG capsule Take 1 capsule (60 mg total) by mouth 2 (two) times daily. 03/04/18   Velna Ochs, MD  ferumoxytol [facility]) 510 MG/17ML SOLN injection Inject 17 mLs (510 mg total) into the vein once for 1 dose. Rx 2 of 2; please administer 1 week after first dose 03/17/18 03/17/18  Velna Ochs, MD  ferumoxytol [facility]) 510 MG/17ML SOLN injection Inject 17 mLs (510 mg total) into the vein once for 1 dose. Rx 1 of 2 03/17/18 03/17/18  Velna Ochs, MD  fluconazole (DIFLUCAN) 150 MG tablet Take 1 tablet (150 mg total) by mouth every three (3) days as needed. 05/15/18   Ina Homes, MD  fluticasone (FLONASE) 50 MCG/ACT nasal spray Place 2 sprays into both nostrils daily. 03/07/17 03/07/18  Velna Ochs, MD  guaiFENesin (MUCINEX) 600 MG 12 hr tablet Take 600 mg by mouth 2 (two) times daily as needed for  cough or to loosen phlegm.    [provider]  hydrochlorothiazide (HYDRODIURIL) 25 MG tablet Take 1 tablet (25 mg total) by mouth daily. 11/17/17   Velna Ochs, MD  hydrOXYzine (VISTARIL) 50 MG capsule Take one capsule daily as needed for anxiety 04/29/18   [name], Arlyce Harman, MD  Multiple Vitamin (MULTIVITAMIN WITH MINERALS) TABS tablet Take 1 tablet by mouth daily.    [provider]  naproxen (NAPROSYN) 500 MG tablet Take 1 tablet (500 mg total) by mouth 2 (two) times daily as needed for  headache (with meals). 06/15/18 06/15/19  Asencion Noble, MD  omeprazole (PRILOSEC) 40 MG capsule Take 1 capsule (40 mg total) by mouth daily. 05/02/17   Velna Ochs, MD  polyethylene glycol-electrolytes (NULYTELY/GOLYTELY) 420 g solution Take 4,000 mLs by mouth as directed. 02/17/18   Milus Banister, MD  pregabalin (LYRICA) 100 MG capsule TAKE 1 CAPSULE BY MOUTH THREE TIMES A DAY 06/02/18   Velna Ochs, MD  promethazine (PHENERGAN) 12.5 MG tablet Take 1 tablet (12.5 mg total) by mouth every 12 (twelve) hours as needed for nausea or vomiting. 01/14/18   Katherine Roan, MD  promethazine (PHENERGAN) 12.5 MG tablet Take 1 tablet (12.5 mg total) by mouth every 6 (six) hours as needed for nausea or vomiting. 02/17/18   Milus Banister, MD  valsartan (DIOVAN) 160 MG tablet Take 1 tablet (160 mg total) by mouth daily. 06/15/18   Asencion Noble, MD  vitamin B-12 (CYANOCOBALAMIN) 1000 MCG tablet Take 1 tablet (1,000 mcg total) by mouth daily. 05/06/17   Velna Ochs, MD  vitamin C (ASCORBIC ACID) 500 MG tablet Take 500 mg by mouth daily.    [provider]    Family History Family History  Problem Relation Age of Onset  . Diabetes Mother   . Heart disease Mother        valve leak  . Anxiety disorder Mother   . Depression Mother   . High blood pressure Mother   . Kidney failure Mother   . Cancer Father        prostate  . Heart disease Father   . Learning  disabilities Sister   . Depression Sister   . Depression Sister   . Anxiety disorder Sister   . Colon cancer Neg Hx     Social History Social History   Tobacco Use  . Smoking status: Current Some Day Smoker    Packs/day: 0.50    Years: 25.00    Pack years: 12.50    Types: Cigarettes  . Smokeless tobacco: Never Used  . Tobacco comment: 3-4  cig per day trying to quit.  Stopped 02/27/2018   Substance Use Topics  . Alcohol use: No    Alcohol/week: 0.0 standard drinks    Comment: occassionally  . Drug use: No     Allergies   Lisinopril   Review of Systems Review of Systems  Constitutional: Negative for fever and unexpected weight change.  HENT: Negative for rhinorrhea and sore throat.   Eyes: Negative for redness.  Respiratory: Negative for cough and shortness of breath.   Cardiovascular: Positive for chest pain. Negative for leg swelling.  Gastrointestinal: Negative for abdominal pain, constipation, diarrhea, nausea and vomiting.       Negative for fecal incontinence.   Genitourinary: Negative for dysuria, flank pain, hematuria, pelvic pain, vaginal bleeding and vaginal discharge.       Negative for urinary incontinence or retention.  Musculoskeletal: Positive for arthralgias, back pain, gait problem, myalgias and neck pain.  Skin: Negative for rash.  Neurological: Negative for weakness, numbness and headaches.       Denies saddle paresthesias.     Physical Exam Updated Vital Signs BP 136/84 (BP Location: Right Arm)   Pulse 88   Temp 98.5 F (36.9 C) (Oral)   Resp (!) 21   Ht 5\' 11"  (1.803 m)   Wt (!) 152.5 kg   SpO2 100%   BMI 46.89 kg/m   Physical Exam Vitals signs and nursing note reviewed.  Constitutional:  Appearance: She is well-developed. She is not diaphoretic.  HENT:     Head: Normocephalic and atraumatic.     Mouth/Throat:     Mouth: Mucous membranes are not dry.  Eyes:     Conjunctiva/sclera: Conjunctivae normal.  Neck:      Musculoskeletal: Normal range of motion and neck supple. No muscular tenderness.     Vascular: Normal carotid pulses. No carotid bruit or JVD.     Trachea: Trachea normal. No tracheal deviation.  Cardiovascular:     Rate and Rhythm: Normal rate and regular rhythm.     Pulses: No decreased pulses.     Heart sounds: Normal heart sounds, S1 normal and S2 normal. No murmur.     Comments: Patient winces with palpation over the anterior chest wall.  No ecchymosis or external signs of trauma. Pulmonary:     Effort: Pulmonary effort is normal. No respiratory distress.     Breath sounds: No wheezing.  Chest:     Chest wall: No tenderness.  Abdominal:     General: Bowel sounds are normal.     Palpations: Abdomen is soft.     Tenderness: There is no abdominal tenderness. There is no guarding or rebound.     Comments: No abdominal tenderness to palpation.  Musculoskeletal:     Right shoulder: She exhibits tenderness. She exhibits normal range of motion and no bony tenderness.     Left shoulder: She exhibits tenderness. She exhibits normal range of motion and no bony tenderness.     Right hip: Normal.     Left hip: Normal.     Right knee: She exhibits normal range of motion, no swelling and no effusion. Tenderness found.     Left knee: She exhibits normal range of motion, no swelling and no effusion. No tenderness found.     Right ankle: Normal.     Left ankle: Normal.     Cervical back: She exhibits decreased range of motion, tenderness and bony tenderness.     Thoracic back: She exhibits decreased range of motion, tenderness and bony tenderness.       Back:     Right lower leg: No edema.     Left lower leg: No edema.     Comments: No step-off noted with palpation of spine.   Skin:    General: Skin is warm and dry.     Coloration: Skin is not pale.     Findings: No rash.  Neurological:     Mental Status: She is alert.     Sensory: No sensory deficit.     Deep Tendon Reflexes: Reflexes  are normal and symmetric.     Comments: 5/5 strength in entire lower extremities bilaterally. No sensation deficit distally of the upper or lower extremities.      ED Treatments / Results  Labs (all labs ordered are listed, but only abnormal results are displayed) Labs Reviewed  CBC - Abnormal; Notable for the following components:      Result Value   RBC 5.21 (*)    Hemoglobin 11.3 (*)    MCV 73.1 (*)    MCH 21.7 (*)    MCHC 29.7 (*)    RDW 19.8 (*)    Platelets 504 (*)    All other components within normal limits  BASIC METABOLIC PANEL  TROPONIN I  I-STAT BETA HCG BLOOD, ED ([facility], [facility], [facility] ONLY)    EKG EKG Interpretation  Date/Time:  Wednesday Jun 24 2018 02:24:45 EDT Ventricular  Rate:  98 PR Interval:  152 QRS Duration: 80 QT Interval:  370 QTC Calculation: 472 R Axis:   59 Text Interpretation:  Normal sinus rhythm Septal infarct , age undetermined Abnormal ECG No significant change since last tracing Confirmed by Ripley Fraise [phone]) on 06/24/2018 3:24:29 AM   Radiology Dg Chest 2 View  Result Date: 06/24/2018 CLINICAL DATA:  Chest pain EXAM: CHEST - 2 VIEW COMPARISON:  03/18/2018 FINDINGS: Cardiac shadows within normal limits. The lungs are well aerated bilaterally. No focal infiltrate or effusion is seen. No acute bony abnormality is noted. IMPRESSION: No acute abnormality noted. Electronically Signed   By: Inez Catalina M.D.   On: 06/24/2018 03:53   Dg Cervical Spine Complete  Result Date: 06/24/2018 CLINICAL DATA:  Fall with back pain. EXAM: CERVICAL SPINE - COMPLETE 4+ VIEW COMPARISON:  Neck CT 01/18/2011 FINDINGS: There is no evidence of cervical spine fracture or prevertebral soft tissue swelling. Left C6-7 uncovertebral spur and foraminal narrowing. IMPRESSION: No evidence of cervical spine injury. Electronically Signed   By: Monte Fantasia M.D.   On: 06/24/2018 05:00   Dg Thoracic Spine 2 View  Result Date: 06/24/2018 CLINICAL DATA:  Fall yesterday with  back pain.  Initial encounter. EXAM: THORACIC SPINE 2 VIEWS COMPARISON:  Chest CT 12/04/2017 FINDINGS: No evidence of fracture or traumatic malalignment. Mild scoliosis with generalized disc narrowing and endplate spurring, stable. Maintained posterior mediastinal fat planes. IMPRESSION: 1. No acute finding. 2. Spondylosis and disc narrowing.  Mild scoliosis. Electronically Signed   By: Monte Fantasia M.D.   On: 06/24/2018 05:01    Procedures Procedures (including critical care time)  Medications Ordered in ED Medications  sodium chloride flush (NS) 0.9 % injection 3 mL (has no administration in time range)  oxyCODONE (Oxy IR/ROXICODONE) immediate release tablet 5 mg (5 mg Oral Given 06/24/18 0403)  methocarbamol (ROBAXIN) tablet 1,000 mg (1,000 mg Oral Given 06/24/18 0403)     Initial Impression / Assessment and Plan / ED Course  I have reviewed the triage vital signs and the nursing notes.  Pertinent labs & imaging results that were available during my care of the patient were reviewed by me and considered in my medical decision making (see chart for details).        Patient seen and examined.  Lab work and history/exam not suggestive of ACS or cardiac etiology.  Chest x-ray is clear.  Lab work does not demonstrate any acute abnormalities or cardiac causes.  I sent the patient back to x-ray for x-ray of her cervical and thoracic spine to ensure no compression fractures.  Pain seems more musculoskeletal in nature.  Will treat with a dose of oxycodone here and Robaxin.  If x-rays are negative will attempt to ambulate the patient.  Vital signs reviewed and are as follows: BP 136/84 (BP Location: Right Arm)   Pulse 88   Temp 98.5 F (36.9 C) (Oral)   Resp (!) 21   Ht 5\' 11"  (1.803 m)   Wt (!) 152.5 kg   SpO2 100%   BMI 46.89 kg/m   5:45 AM patient has ambulated.  Updated on x-ray results.  We will discharged home with prescription for Robaxin.  She will continue rice protocol,  heating pad.  Encourage PCP follow-up as needed.  We discussed the typical course of symptoms after injury such as she had.  Encourage PCP follow-up as needed.  Patient counseled on proper use of muscle relaxant medication.  They were told not to  drink alcohol, drive any vehicle, or do any dangerous activities while taking this medication.  Patient verbalized understanding.   Final Clinical Impressions(s) / ED Diagnoses   Final diagnoses:  Musculoskeletal pain   Patient with several areas of pain after a mechanical fall.  Chest pain labs and x-ray were ordered given her complaint of chest pain however this seems musculoskeletal in nature.  Patient also has pain in her neck and back as well as her lower extremities.  Imaging of the cervical and thoracic spines do not demonstrate any compression fractures or other abnormalities.  She does not have any neurological deficits on her exam or warning symptoms of back pain to necessitate advanced imaging at this point.  Patient has been ambulatory in the department.  Symptoms relatively controlled after administration of Robaxin and dose of oxycodone.  Will be given prescription for muscle relaxer.  ED Discharge Orders    None       Carlisle Cater, Hershal Coria 06/24/18 0550    Ripley Fraise, MD 06/24/18 [phone]

## 2018-06-24 NOTE — ED Triage Notes (Signed)
Reports falling down yesterday landing on her butt.  Having neck/upper back pain and now reports having sharp central chest pain.

## 2018-06-25 ENCOUNTER — Other Ambulatory Visit: Payer: Self-pay | Admitting: *Deleted

## 2018-06-25 DIAGNOSIS — J9620 Acute and chronic respiratory failure, unspecified whether with hypoxia or hypercapnia: Secondary | ICD-10-CM | POA: Diagnosis not present

## 2018-06-25 DIAGNOSIS — M79604 Pain in right leg: Secondary | ICD-10-CM

## 2018-06-25 NOTE — Telephone Encounter (Signed)
Called pt - [facility] appt schedule for Tues 6/2 @ 1015 AM after iron infusion that morning. Pt upset; stated she needs something her appt like naproxen and lyrica. Informed pt lyrica was refilled today.

## 2018-06-25 NOTE — Telephone Encounter (Signed)
Call from [facility] - stated pt had a fall recently; talked to Dr Shan Levans on 5/25 also went to ED yesterday. Pt is still havinga lot of pain in spite of taking mus relaxant as prescribed.  I called pt - c/o shoulder,back and neck pain. No available appts until next week. Stated she has take Tylenol # 3 before. Wants to to know if her doctor could prescribe this or something else. Thanks

## 2018-06-25 NOTE — Telephone Encounter (Signed)
Please have her schedule a follow up appointment in [facility] to be seen. Thank you!

## 2018-06-26 ENCOUNTER — Other Ambulatory Visit: Payer: Self-pay | Admitting: Internal Medicine

## 2018-06-26 ENCOUNTER — Other Ambulatory Visit: Payer: Self-pay

## 2018-06-26 DIAGNOSIS — M79605 Pain in left leg: Secondary | ICD-10-CM

## 2018-06-26 DIAGNOSIS — M79604 Pain in right leg: Secondary | ICD-10-CM

## 2018-06-26 MED ORDER — PEG 3350-KCL-NA BICARB-NACL 420 G PO SOLR: 4000.0000 mL | ORAL | 0 refills | Status: DC

## 2018-06-26 MED ORDER — NAPROXEN 500 MG PO TABS: 500.0000 mg | ORAL_TABLET | Freq: Two times a day (BID) | ORAL | 0 refills | Status: DC | PRN

## 2018-06-26 NOTE — Patient Outreach (Signed)
[facility]) Care Management  06/26/2018  Lindsay [name] 1970-05-30 [id number]   Telephone Screen  Referral Date: 06/26/2018 Referral Source: [facility] Dept. Referral Reason: "sometimes it is difficult for her to afford her medications" Insurance: [facility] Medicare   Outreach attempt # 1 to patient. No answer. RN CM left HIPAA compliant voicemail message along with contact info.    Plan: RN CM will make outreach attempt to patient within 3-4 business days. RN CM will send unsuccessful outreach letter to patient.  Enzo Montgomery, RN,BSN,CCM [facility] Telephonic Care Management Coordinator Direct Phone: [phone] Toll Free: [phone] Fax: [phone]

## 2018-06-29 ENCOUNTER — Other Ambulatory Visit: Payer: Self-pay | Admitting: Internal Medicine

## 2018-06-29 ENCOUNTER — Other Ambulatory Visit: Payer: Self-pay | Admitting: *Deleted

## 2018-06-29 ENCOUNTER — Telehealth: Payer: Self-pay | Admitting: Internal Medicine

## 2018-06-29 DIAGNOSIS — I10 Essential (primary) hypertension: Secondary | ICD-10-CM

## 2018-06-29 DIAGNOSIS — K219 Gastro-esophageal reflux disease without esophagitis: Secondary | ICD-10-CM

## 2018-06-29 MED ORDER — OMEPRAZOLE 40 MG PO CPDR: 40.0000 mg | DELAYED_RELEASE_CAPSULE | Freq: Every day | ORAL | 11 refills | Status: DC

## 2018-06-29 NOTE — Discharge Instructions (Signed)

## 2018-06-29 NOTE — Telephone Encounter (Signed)
Medical Day Marissa Nestle is needing orders in the system for patient appt tomorrow [phone]

## 2018-06-30 ENCOUNTER — Other Ambulatory Visit: Payer: Self-pay

## 2018-06-30 ENCOUNTER — Other Ambulatory Visit (HOSPITAL_COMMUNITY): Payer: Self-pay | Admitting: *Deleted

## 2018-06-30 ENCOUNTER — Encounter (HOSPITAL_COMMUNITY)
Admission: RE | Admit: 2018-06-30 | Discharge: 2018-06-30 | Disposition: A | Discharge status: Home / Self Care | Payer: Medicare Other | Source: Ambulatory Visit | Attending: Internal Medicine | Admitting: Internal Medicine

## 2018-06-30 ENCOUNTER — Encounter: Payer: Self-pay | Admitting: Internal Medicine

## 2018-06-30 ENCOUNTER — Ambulatory Visit (INDEPENDENT_AMBULATORY_CARE_PROVIDER_SITE_OTHER): Payer: Medicare Other | Admitting: Internal Medicine

## 2018-06-30 VITALS — BP 140/89 | HR 84 | Temp 98.5°F | Ht 71.0 in | Wt 342.6 lb

## 2018-06-30 DIAGNOSIS — M542 Cervicalgia: Secondary | ICD-10-CM

## 2018-06-30 DIAGNOSIS — G8911 Acute pain due to trauma: Secondary | ICD-10-CM | POA: Diagnosis not present

## 2018-06-30 DIAGNOSIS — M62838 Other muscle spasm: Secondary | ICD-10-CM

## 2018-06-30 DIAGNOSIS — R0789 Other chest pain: Secondary | ICD-10-CM | POA: Diagnosis not present

## 2018-06-30 DIAGNOSIS — Z791 Long term (current) use of non-steroidal anti-inflammatories (NSAID): Secondary | ICD-10-CM

## 2018-06-30 DIAGNOSIS — M940 Chondrocostal junction syndrome [Tietze]: Secondary | ICD-10-CM

## 2018-06-30 DIAGNOSIS — M546 Pain in thoracic spine: Secondary | ICD-10-CM | POA: Diagnosis not present

## 2018-06-30 DIAGNOSIS — D509 Iron deficiency anemia, unspecified: Secondary | ICD-10-CM | POA: Insufficient documentation

## 2018-06-30 DIAGNOSIS — W19XXXD Unspecified fall, subsequent encounter: Secondary | ICD-10-CM

## 2018-06-30 DIAGNOSIS — Z9181 History of falling: Secondary | ICD-10-CM

## 2018-06-30 DIAGNOSIS — W19XXXA Unspecified fall, initial encounter: Secondary | ICD-10-CM | POA: Insufficient documentation

## 2018-06-30 MED ORDER — SODIUM CHLORIDE 0.9 % IV SOLN
510.0000 mg | INTRAVENOUS | Status: DC
  Administered 2018-06-30: 510 mg via INTRAVENOUS
  Filled 2018-06-30: qty 510

## 2018-06-30 NOTE — Patient Outreach (Signed)
[facility]) Care Management  06/30/2018  Lindsay [name] December 03, 1970 [id number]   Telephone Screen  Referral Date: 06/26/2018 Referral Source: [facility] Dept. Referral Reason: "sometimes it is difficult for her to afford her medications" Insurance: [facility] Medicare   Outreach attempt #2 to patient. Spoke with patient. She voiced that she was at iron infusion appt and could not talk at present.      Plan: RN CM will make outreach attempt to patient within 3-4 business days.   Enzo Montgomery, RN,BSN,CCM [place] [facility] Telephonic Care Management Coordinator Direct Phone: [phone] Toll Free: [phone] Fax: [phone]

## 2018-06-30 NOTE — Progress Notes (Signed)
CC: Pain, ED Follow up  HPI:  [name] is a 48 y.o. F with PMHx listed below presenting for Pain, ED Follow up. Please see the A&P for the status of the patient's chronic medical problems.  Patient experienced a fall on 5/25. OTC pain medication was not working, so patient went to the ED on 5/27. She had X-ray of C-spine, T-spine and chest that showed no acute changes, but did show known mild scoliosis.   She describes R neck tightness and pain. And Chest wall pain on the right. She states the pain is around a 9/10, but appears comfortable. Her neck pain is worse with any movement of her neck or palpation. Her chest wall pain is worsen with palpation, movement, and pressure on her back.  She has tried Tylenol, Naproxen, and BC powder at various time for her pain, which did not help. The robaxin she received in the ED did not help either. She was alternated heat and ice and that did not seem to help much either.  She has plenty of her Lyrica and naproxen that she takes chronically.   Past Medical History:  Diagnosis Date  . Acid reflux   . Anemia    Iron Def  . Anorexia   . Chronic kidney disease    Nephrotic syndrome  . Colon polyp 2009  . Depression with anxiety   . Edema leg   . Fibromyalgia   . Hemorrhoids   . Hidradenitis suppurativa   . Hypertension   . IBS (irritable bowel syndrome)   . Low back pain   . Migraines   . Morbidly obese ([place])   . Neuromuscular disorder (HCC)    fibromyalgia  . Neuropathy   . Panic attacks   . Polyp of vocal cord or larynx   . Tonsil pain    Review of Systems:  Performed and all others negative.  Physical Exam:  Vitals:   06/30/18 1043  BP: 140/89  Pulse: 84  Temp: 98.5 F (36.9 C)  TempSrc: Oral  SpO2: 97%  Weight: (!) 342 lb 9.6 oz (155.4 kg)  Height: 5\' 11"  (1.803 m)   Physical Exam Constitutional:      General: She is not in acute distress.    Appearance: Normal appearance.     Comments: Obese Female in wheel  chair  Cardiovascular:     Rate and Rhythm: Normal rate and regular rhythm.     Pulses: Normal pulses.     Heart sounds: Normal heart sounds.  Pulmonary:     Effort: Pulmonary effort is normal. No respiratory distress.     Breath sounds: Normal breath sounds.  Abdominal:     General: There is no distension.     Palpations: Abdomen is soft.     Tenderness: There is no abdominal tenderness.  Musculoskeletal:        General: Tenderness present.     Comments: Cervical Spine - At rest, appears laterally flexed to the right - ROM limited by pain for rotation, flexion, and extension - - TTP on Right - Mild neck pain with >90 degree of Shoulder Abduction N Thoracic Spine - Paraspinal thoracic-spine muscle tenderness Chest Wall - Tender to palpation of right costochondral joints.    Skin:    General: Skin is warm and dry.  Neurological:     General: No focal deficit present.     Mental Status: Mental status is at baseline.    Assessment & Plan:   See Encounters Tab  for problem based charting.  Patient discussed with Dr. Daryll Drown

## 2018-06-30 NOTE — Assessment & Plan Note (Addendum)
Patient experienced a fall on 5/25. OTC pain medication was not working, so patient went to the ED on 5/27. She had X-ray of C-spine, T-spine and chest that showed no acute changes, but did show known mild scoliosis.   She describes R neck tightness and pain. And Chest wall pain on the right. She states the pain is around a 9/10, but appears comfortable. Her neck pain is worse with any movement of her neck or palpation. Her chest wall pain is worsen with palpation, movement, and pressure on her back.  She has tried Tylenol, Naproxen, and BC powder at various time for her pain, which did not help. The robaxin she received in the ED did not help either. She was alternated heat and ice and that did not seem to help much either.  She has plenty of her Lyrica and naproxen that she takes chronically.   On exam, cervical rotation, flexion, and extension are limited by pain. Neck is tender to palpation. At rest, patient's head appears laterally flexed to the right. Chest wall is tender to palpation of costochondral joints. Paraspinal thoracic-spine muscle tenderness.  Symptoms from fall consistent with R costochondritis, muscle spasm of her R neck, MSK pain of her thoracic spine. - Naproxen 500mg  scheduled BID for 2 weeks, then PRN - Continue to apply heat neck 3-4 times per day - Perform gentle neck stretching exercises (information provided) - Referral to physical therapy

## 2018-06-30 NOTE — Patient Instructions (Addendum)
Thank you for allowing [name] to care for you  Your symptoms from your fall are consistent with joint inflammation of your chest wall, and muscle spasm of your neck and back - Take your Naproxen 500mg  Twice a day for 2 weeks - Continue to apply heat to your neck 3-4 times per day - Perform gentle neck stretching exercises as below (hold about 10 seconds, several repetitions, 2-3 times per day - We have sent a referral to physical therapy  Please follow up with PCP as scheduled. Return for significant worsening of pain   Cervical Strain and Sprain Rehab Ask your health care provider which exercises are safe for you. Do exercises exactly as told by your health care provider and adjust them as directed. It is normal to feel mild stretching, pulling, tightness, or discomfort as you do these exercises, but you should stop right away if you feel sudden pain or your pain gets worse.Do not begin these exercises until told by your health care provider. Stretching and range of motion exercises These exercises warm up your muscles and joints and improve the movement and flexibility of your neck. These exercises also help to relieve pain, numbness, and tingling. Exercise A: Cervical side bend  1. Using good posture, sit on a stable chair or stand up. 2. Without moving your shoulders, slowly tilt your left / right ear to your shoulder until you feel a stretch in your neck muscles. You should be looking straight ahead. 3. Hold for __________ seconds. 4. Repeat with the other side of your neck. Repeat __________ times. Complete this exercise __________ times a day. Exercise B: Cervical rotation  1. Using good posture, sit on a stable chair or stand up. 2. Slowly turn your head to the side as if you are looking over your left / right shoulder. ? Keep your eyes level with the ground. ? Stop when you feel a stretch along the side and the back of your neck. 3. Hold for __________ seconds. 4. Repeat this by  turning to your other side. Repeat __________ times. Complete this exercise __________ times a day. Exercise C: Thoracic extension and pectoral stretch 1. Roll a towel or a small blanket so it is about 4 inches (10 cm) in diameter. 2. Lie down on your back on a firm surface. 3. Put the towel lengthwise, under your spine in the middle of your back. It should not be not under your shoulder blades. The towel should line up with your spine from your middle back to your lower back. 4. Put your hands behind your head and let your elbows fall out to your sides. 5. Hold for __________ seconds. Repeat __________ times. Complete this exercise __________ times a day. Strengthening exercises These exercises build strength and endurance in your neck. Endurance is the ability to use your muscles for a long time, even after your muscles get tired. Exercise D: Upper cervical flexion, isometric 1. Lie on your back with a thin pillow behind your head and a small rolled-up towel under your neck. 2. Gently tuck your chin toward your chest and nod your head down to look toward your feet. Do not lift your head off the pillow. 3. Hold for __________ seconds. 4. Release the tension slowly. Relax your neck muscles completely before you repeat this exercise. Repeat __________ times. Complete this exercise __________ times a day. Exercise E: Cervical extension, isometric  1. Stand about 6 inches (15 cm) away from a wall, with your back facing the  wall. 2. Place a soft object, about 6-8 inches (15-20 cm) in diameter, between the back of your head and the wall. A soft object could be a small pillow, a ball, or a folded towel. 3. Gently tilt your head back and press into the soft object. Keep your jaw and forehead relaxed. 4. Hold for __________ seconds. 5. Release the tension slowly. Relax your neck muscles completely before you repeat this exercise. Repeat __________ times. Complete this exercise __________ times a  day. Posture and body mechanics Body mechanics refers to the movements and positions of your body while you do your daily activities. Posture is part of body mechanics. Good posture and healthy body mechanics can help to relieve stress in your body's tissues and joints. Good posture means that your spine is in its natural S-curve position (your spine is neutral), your shoulders are pulled back slightly, and your head is not tipped forward. The following are general guidelines for applying improved posture and body mechanics to your everyday activities. Standing   When standing, keep your spine neutral and keep your feet about hip-width apart. Keep a slight bend in your knees. Your ears, shoulders, and hips should line up.  When you do a task in which you stand in one place for a long time, place one foot up on a stable object that is 2-4 inches (5-10 cm) high, such as a footstool. This helps keep your spine neutral. Sitting   When sitting, keep your spine neutral and your keep feet flat on the floor. Use a footrest, if necessary, and keep your thighs parallel to the floor. Avoid rounding your shoulders, and avoid tilting your head forward.  When working at a desk or a computer, keep your desk at a height where your hands are slightly lower than your elbows. Slide your chair under your desk so you are close enough to maintain good posture.  When working at a computer, place your monitor at a height where you are looking straight ahead and you do not have to tilt your head forward or downward to look at the screen. Resting When lying down and resting, avoid positions that are most painful for you. Try to support your neck in a neutral position. You can use a contour pillow or a small rolled-up towel. Your pillow should support your neck but not push on it. This information is not intended to replace advice given to you by your health care provider. Make sure you discuss any questions you have with  your health care provider. Document Released: 01/14/2005 Document Revised: 09/21/2015 Document Reviewed: 12/21/2014 Elsevier Interactive Patient Education  2019 [employer].

## 2018-07-01 ENCOUNTER — Other Ambulatory Visit: Payer: Self-pay

## 2018-07-01 NOTE — Patient Outreach (Signed)
[facility]) Care Management  07/01/2018  Lindsay [name] Apr 10, 1970 [id number]   Successful outreach to patient regarding social work referral for transportation resources.  Patient has transportation benefit through [employer] but has almost exhausted all rides.  She is also able to utilize Medicaid transportation but prefers not to   [place] educated her about SCAT services and the application/assessment process.  BSW completed application via phone and submitted to eligibility.  BSW will follow up with her within the next two weeks regarding status of application.  Ronn Melena, BSW Social Worker [phone]

## 2018-07-01 NOTE — Patient Outreach (Signed)
[facility]) Care Management  07/01/2018  Lindsay [name] 08-04-1970 [id number]   Telephone Screen  Referral Date:06/26/2018 Referral Source:[facility]. Referral Reason:"sometimes it is difficult for her to afford her medications" Insurance:[employer] Medicare   Outreach attempt #3 to patient. Spoke with patient. RN CM reviewed and discussed referral reason and source with patient. Patient looking for assistance with paying for Toradol. Advised patient that federal regulations prohibit assistance with pain and narcotic meds. Patient then reported that she has [facility] Medicare and Medicaid. She states that her med co-pays frange from $1 to $4 and she is able to get them. She reports that she has almost used up all her transportation benefit rides with [facility] and will run out before the end of the year. Patient requesting assistance with SCAT application and securing Medicaid transportation. She is agreeable to SW referral for further info and possible assistance. She has supportive daughter who assists her as needed. She denies any issues managing her chronic conditions. She voices no further RN CM needs or concerns at this time.       Plan: RN CM will send [facility] SW referral for possible transportation assistance and resources.  Enzo Montgomery, RN,BSN,CCM [facility] Telephonic Care Management Coordinator Direct Phone: [phone] Toll Free: [phone] Fax: [phone]

## 2018-07-01 NOTE — Progress Notes (Signed)
Internal Medicine Clinic Attending  Case discussed with Dr. Melvin  at the time of the visit.  We reviewed the resident's history and exam and pertinent patient test results.  I agree with the assessment, diagnosis, and plan of care documented in the resident's note.  

## 2018-07-03 ENCOUNTER — Other Ambulatory Visit: Payer: Self-pay | Admitting: Internal Medicine

## 2018-07-03 NOTE — Telephone Encounter (Signed)
Needs refill on pain medicine pain [id number] [place], [place] - [place]  ;pt contact [phone]  Pls call patient back [phone], she is in pain

## 2018-07-03 NOTE — Telephone Encounter (Signed)
rtc to pt, lm for rtc 

## 2018-07-06 ENCOUNTER — Other Ambulatory Visit: Payer: Self-pay | Admitting: Internal Medicine

## 2018-07-06 ENCOUNTER — Other Ambulatory Visit: Payer: Self-pay

## 2018-07-06 DIAGNOSIS — M79604 Pain in right leg: Secondary | ICD-10-CM

## 2018-07-06 NOTE — Telephone Encounter (Signed)
Dr. Philipp Ovens, Juluis Rainier.[name] Pt was also referred out to [facility], no pending appointment.  This nurse called neuro-rehab, spoke with [place] and she states she will call patient now to schedule appt in neuro-rehab. Thanks, SChaplin, RN,BSN

## 2018-07-06 NOTE — Telephone Encounter (Signed)
Return call to patient.  States she is still having neck and head pain which is sharp and continuous.  She was seen in [facility] on 06/30/18 and has a f/u with PCP on 07/23/18.   She is requesting refill on Robaxin and naproxen. Will forward to PCP. SChaplin, RN,BSN

## 2018-07-06 NOTE — Telephone Encounter (Signed)
Pt missed call on Friday pls contact back

## 2018-07-06 NOTE — Telephone Encounter (Signed)
Thank you :)

## 2018-07-07 ENCOUNTER — Encounter (HOSPITAL_COMMUNITY)
Admission: RE | Admit: 2018-07-07 | Discharge: 2018-07-07 | Disposition: A | Discharge status: Home / Self Care | Payer: Medicare Other | Source: Ambulatory Visit | Attending: Internal Medicine | Admitting: Internal Medicine

## 2018-07-07 DIAGNOSIS — D509 Iron deficiency anemia, unspecified: Secondary | ICD-10-CM | POA: Diagnosis not present

## 2018-07-07 MED ORDER — METHOCARBAMOL 500 MG PO TABS: 500.0000 mg | ORAL_TABLET | Freq: Three times a day (TID) | ORAL | 0 refills | Status: DC | PRN

## 2018-07-07 MED ORDER — NAPROXEN 500 MG PO TABS: 500.0000 mg | ORAL_TABLET | Freq: Two times a day (BID) | ORAL | 0 refills | Status: DC | PRN

## 2018-07-07 MED ORDER — SODIUM CHLORIDE 0.9 % IV SOLN
510.0000 mg | INTRAVENOUS | Status: DC
  Administered 2018-07-07: 510 mg via INTRAVENOUS
  Filled 2018-07-07: qty 510

## 2018-07-07 NOTE — Telephone Encounter (Signed)
Return pt's call - stated she's in a lot of pain with any kind of movement; unable to lie flat. Neuro PT is schedule 6/29. She's out of naproxen and robaxin. She has an appt on Friday in [place]; stated she waiting for a response from Transportation.

## 2018-07-07 NOTE — Telephone Encounter (Signed)
Refills approved for 1 week supply with dose and quantity changes made

## 2018-07-07 NOTE — Telephone Encounter (Signed)
Pt is calling back, pls contact pt

## 2018-07-07 NOTE — Telephone Encounter (Signed)
Looks like she is on campus today getting feraheme infusion (says "arrived" under encounters). Can she be seen in [facility] today? Forwarded refill request to Dr. Trilby Drummer who saw her in [facility].

## 2018-07-08 ENCOUNTER — Other Ambulatory Visit: Payer: Self-pay | Admitting: Internal Medicine

## 2018-07-08 DIAGNOSIS — I10 Essential (primary) hypertension: Secondary | ICD-10-CM

## 2018-07-08 NOTE — Telephone Encounter (Signed)
Pt called / informed of refills. 

## 2018-07-10 ENCOUNTER — Other Ambulatory Visit: Payer: Self-pay

## 2018-07-10 ENCOUNTER — Encounter: Payer: Self-pay | Admitting: Internal Medicine

## 2018-07-10 ENCOUNTER — Ambulatory Visit (INDEPENDENT_AMBULATORY_CARE_PROVIDER_SITE_OTHER): Payer: Medicare Other | Admitting: Internal Medicine

## 2018-07-10 ENCOUNTER — Other Ambulatory Visit: Payer: Self-pay | Admitting: Internal Medicine

## 2018-07-10 VITALS — BP 140/89 | HR 92 | Temp 99.1°F | Wt 340.0 lb

## 2018-07-10 DIAGNOSIS — Z9181 History of falling: Secondary | ICD-10-CM | POA: Diagnosis not present

## 2018-07-10 DIAGNOSIS — M542 Cervicalgia: Secondary | ICD-10-CM | POA: Diagnosis not present

## 2018-07-10 DIAGNOSIS — Z9981 Dependence on supplemental oxygen: Secondary | ICD-10-CM

## 2018-07-10 DIAGNOSIS — G8911 Acute pain due to trauma: Secondary | ICD-10-CM | POA: Diagnosis not present

## 2018-07-10 DIAGNOSIS — R0789 Other chest pain: Secondary | ICD-10-CM | POA: Diagnosis not present

## 2018-07-10 DIAGNOSIS — M79604 Pain in right leg: Secondary | ICD-10-CM

## 2018-07-10 DIAGNOSIS — W19XXXD Unspecified fall, subsequent encounter: Secondary | ICD-10-CM

## 2018-07-10 NOTE — Patient Instructions (Signed)
For your ongoing pain and muscle spasm following your fall - Continue with naproxen and heat - Apply voltaren gel 4 times a day to your chest wall - While wait for PT we recommend gentle massage, since no one is at home to help with this you can try "kneaded [employer]". They have professional and student massages.

## 2018-07-10 NOTE — Progress Notes (Signed)
   CC: Fall follow up  HPI:  [name] is a 48 y.o. F with PMHx listed below presenting for Fall follow up. Please see the A&P for the status of the patient's chronic medical problems.  Past Medical History:  Diagnosis Date  . Acid reflux   . Anemia    Iron Def  . Anorexia   . Chronic kidney disease    Nephrotic syndrome  . Colon polyp 2009  . Depression with anxiety   . Edema leg   . Fibromyalgia   . Hemorrhoids   . Hidradenitis suppurativa   . Hypertension   . IBS (irritable bowel syndrome)   . Low back pain   . Migraines   . Morbidly obese ([place])   . Neuromuscular disorder (HCC)    fibromyalgia  . Neuropathy   . Panic attacks   . Polyp of vocal cord or larynx   . Tonsil pain    Review of Systems: Performed and all others negative.  Physical Exam:  Vitals:   07/10/18 0912  BP: 140/89  Pulse: 92  Temp: 99.1 F (37.3 C)  TempSrc: Oral  SpO2: 93%  Weight: (!) 340 lb (154.2 kg)    Physical Exam Constitutional:      General: She is not in acute distress.    Appearance: Normal appearance.     Comments: Obese Female in wheel chair  Cardiovascular:     Rate and Rhythm: Normal rate and regular rhythm.     Pulses: Normal pulses.     Heart sounds: Normal heart sounds.  Pulmonary:     Effort: Pulmonary effort is normal. No respiratory distress.     Breath sounds: Normal breath sounds.  Abdominal:     General: There is no distension.     Palpations: Abdomen is soft.     Tenderness: There is no abdominal tenderness.  Musculoskeletal:        General: Tenderness present.     Comments: Cervical Spine - At rest, appears laterally flexed to the right (able to straighten) - ROM limited by pain for rotation, flexion, and extension - - TTP on Right - Neck pain with >90 degree of Shoulder Abduction Thoracic Spine - Paraspinal thoracic-spine muscle tenderness R>L Chest Wall - Tender to palpation of costochondral joints R>L   Skin:    General: Skin is warm  and dry.  Neurological:     General: No focal deficit present.     Mental Status: Mental status is at baseline.    Assessment & Plan:   See Encounters Tab for problem based charting.  Patient discussed with Dr. Daryll Drown

## 2018-07-10 NOTE — Assessment & Plan Note (Addendum)
Patient presents again for follow up after a fall on 5/25. See previous note from 6/2 for details. She comes in today reporting minimal improvement with treatments provided (Naproxen, Heat, Stretching, Muscle relaxant). She has been referred to PT, but her appointment is not until 6/29.  She states her neck pain has persisted and the muscle relaxant has allowed her to function but she remains in pain. The pain continues to radiate down her back in the distribution of her trapezius and causes intermittent tension headaches.  She continue to experience chest wall pain at her costochondral joints and a sharper pain in the same area with deep inspiration. Pain is reproducible on palpation. She states she has additionally tried alternating Aspercreme and Voltaren gel on her chest.  She does not have anyone at home to help her with her stretching or to provide therapeutic massage. She does not have PT until 6/29. A local massage practice with discounts was discussed with her as a possible option while she awaits PT, but she is unable to afford this.  She asks if there is any possible of collapsed lung or rib fracture that was not caught on the first set of imaging. She was informed that a collapsed lung was not seen on imaging and is not likely given her oxygen saturation of 93% in the setting of her chronic lung problems. She was informed that a rib fracture can, at times, be missed; but repeat imaging would not change her management.  When discussing that she is overall on the right medications to treat her muscle spasms and musculoskeletal pain, patient became frustrated and stated "so y'all ain't gonna do shit for me". I attempted to comfort patient and inform that it can take time and she should see further improvement with PT; but she was too frustrated to listen at the time.  She stood up, pick-up her oxygen take and walked out of the office before she could receive her AVS. Wheel chair was offered, but  patient refuse. She was able to walk out under her own power, standing up straight, while carrying her oxygen. While she remained frustrated I am encourage by the posture she was able to maintain and her relative ease with walking that she is improving and does retain adequate function.  Will continue current medications. Patient was advised before she left to try using Voltaren gel alone instead of alternating this to give it the best chance to work. There is some concern that her pain may be further exacerbated by her history of Anxiety, Depression and Fibromyalgia; but there was not an opportunity to ask further question about this. - Naproxen 500mg , PRN BID - Zanaflex PRN (muscle spasm) - Voltaren gel, QID PRN (chest wall pain) - Home stretches and massage as able - PT starting on June 29 - She remains on chronic Lyrica - Refills will be provided as needed, as patient left before this could be adressed

## 2018-07-11 ENCOUNTER — Telehealth: Payer: Self-pay | Admitting: Internal Medicine

## 2018-07-11 DIAGNOSIS — M79604 Pain in right leg: Secondary | ICD-10-CM

## 2018-07-11 NOTE — Telephone Encounter (Signed)
   Reason for call:   I received a call from [name] at ~2 PM indicating that her chest pain for which she was evaluated for in the ED on 5/27 and again in the IMTS clinic twice 2/2 to a fall was persisting.   Pertinent Data:   Chest Pain: fell on 5/25, called and reported knee pain and discussed with Dr. Shan Levans.  Called again on 05/27 stating she now had neck/back and sharp chest pain after the fall.   Was seen in the ED with negative troponin and EKG unremarkable. CXR did not reveal an occult fracture.   Seen in [facility] and treated with muscle relaxer due to spasm and naproxen.   Returned to [facility] on 06/12 for same due to persistent pain.  Today her primary concern is with the persistent pleuritic chest pain. After personally reviewing the images I am unable to identify any change missed by the radiologist. Exam is limited by body habitus.     Assessment / Plan / Recommendations:   I am not certain what to make of her pain but do not feel it is cardiac in nature given the progression of the pain and persistence since her fall. Likely musculoskeletal vs small rib fracture as this sound suspicious for such. Low suspicion for a spinal injury but possible. Plain films unremarkable.   May need to consider additional imaging if pain persists.   Will refill the naproxen for a short additional course as well as a muscle relaxer.   Advised patient to call back if her symptoms worsen, she develops new symptoms or has other concerns.   As always, pt is advised that if symptoms worsen or new symptoms arise, they should go to an urgent care facility or to to ER for further evaluation.   Kathi Ludwig, MD   07/11/2018, 2:21 PM

## 2018-07-14 NOTE — Progress Notes (Signed)
Internal Medicine Clinic Attending  Case discussed with Dr. Melvin  at the time of the visit.  We reviewed the resident's history and exam and pertinent patient test results.  I agree with the assessment, diagnosis, and plan of care documented in the resident's note.  

## 2018-07-15 ENCOUNTER — Other Ambulatory Visit: Payer: Self-pay

## 2018-07-15 NOTE — Patient Outreach (Signed)
[facility]) Care Management  07/15/2018  Tor[place] Sponsel 10-10-1970 [id number]  Patient certified for [facility] services.  Contacted her today to inform her of this and that she will receive certification packet via mail.  BSW closing case but encouraged her to call if additional needs arise.  Ronn Melena, BSW Social Worker [phone]

## 2018-07-16 ENCOUNTER — Telehealth: Payer: Self-pay

## 2018-07-16 DIAGNOSIS — J9620 Acute and chronic respiratory failure, unspecified whether with hypoxia or hypercapnia: Secondary | ICD-10-CM | POA: Diagnosis not present

## 2018-07-16 NOTE — Telephone Encounter (Signed)
Received TC from pt:  Has multiple complaints starting with she can't see a dentist about her mouth/tooth abscess until after 7/4 due to "covid restrictions at dental office".  Informed pt to keep her standing appt with dentist. Pt also c/o continued back and neck pain from a fall in late May which she presented to ER for evaluation.  She states pain is the same and it "hurts to breathe when she smells a flower".  Pt alternating naproxen and tylenol, using Voltaren gel, states no relief.  Hasn't tried massage therapy because "she can't afford it". Pt asking several times for referral to a pain management clinic. Pt has appt w PCP on 07/23/18 and [facility] on 07/27/18.  Pt instructed to keep both appointments and discuss pain management at [facility].  Pt instructed if pain becomes severe or she experiences SOB, difficulty breathing, to go to ER, she verbalized understanding. Will route to PCP to further advise. SChaplin, RN,BSN

## 2018-07-17 ENCOUNTER — Other Ambulatory Visit: Payer: Self-pay | Admitting: Internal Medicine

## 2018-07-17 NOTE — Telephone Encounter (Signed)
Pt is stating is calling her back, she is in pain  pls contact pt [phone] Pt she is not wanting to go through this pain all weekend, she is wanting something for the pain

## 2018-07-20 NOTE — Telephone Encounter (Signed)
Please send refill request to Dr. Trilby Drummer, looks like he has been addressing this issue with her in [facility]. Thank you.

## 2018-07-20 NOTE — Addendum Note (Signed)
Addended by: Neva Seat B on: 07/20/2018 12:17 PM   Modules accepted: Orders

## 2018-07-20 NOTE — Telephone Encounter (Signed)
Refill approved.

## 2018-07-22 NOTE — Telephone Encounter (Signed)
Entered in error

## 2018-07-23 ENCOUNTER — Ambulatory Visit (INDEPENDENT_AMBULATORY_CARE_PROVIDER_SITE_OTHER): Payer: Medicare Other | Admitting: Internal Medicine

## 2018-07-23 ENCOUNTER — Other Ambulatory Visit: Payer: Self-pay

## 2018-07-23 DIAGNOSIS — G8929 Other chronic pain: Secondary | ICD-10-CM

## 2018-07-23 DIAGNOSIS — J9611 Chronic respiratory failure with hypoxia: Secondary | ICD-10-CM | POA: Diagnosis not present

## 2018-07-23 DIAGNOSIS — E661 Drug-induced obesity: Secondary | ICD-10-CM | POA: Diagnosis not present

## 2018-07-23 DIAGNOSIS — M797 Fibromyalgia: Secondary | ICD-10-CM | POA: Diagnosis not present

## 2018-07-23 DIAGNOSIS — M79604 Pain in right leg: Secondary | ICD-10-CM

## 2018-07-23 DIAGNOSIS — Z79899 Other long term (current) drug therapy: Secondary | ICD-10-CM

## 2018-07-23 DIAGNOSIS — E538 Deficiency of other specified B group vitamins: Secondary | ICD-10-CM | POA: Diagnosis not present

## 2018-07-23 DIAGNOSIS — Z9181 History of falling: Secondary | ICD-10-CM

## 2018-07-23 DIAGNOSIS — E611 Iron deficiency: Secondary | ICD-10-CM

## 2018-07-23 DIAGNOSIS — Z9981 Dependence on supplemental oxygen: Secondary | ICD-10-CM

## 2018-07-23 MED ORDER — NAPROXEN 500 MG PO TABS: 500.0000 mg | ORAL_TABLET | Freq: Two times a day (BID) | ORAL | 2 refills | Status: DC | PRN

## 2018-07-23 NOTE — Progress Notes (Signed)
    This is a telephone encounter between [name] and Velna Ochs on 07/23/2018 for  . The visit was conducted with the patient located at home and Velna Ochs at [facility]. The patient's identity was confirmed using their DOB and current address. The patient has consented to being evaluated through a telephone encounter and understands the associated risks (an examination cannot be done and the patient may need to come in for an appointment) / benefits (allows the patient to remain at home, decreasing exposure to coronavirus). I personally spent 18 minutes on medical discussion.   CC: leg and back pain  HPI:  [name] is a 47 y.o. female with past medical history outlined below. Patient was contacted for a telehealth appointment for leg and back pain. For the details of today's visit, please refer to the assessment and plan.   Review of Systems  Respiratory: Positive for shortness of breath.   Musculoskeletal: Positive for back pain, falls and myalgias.     Assessment & Plan:   See Encounters Tab for problem based charting.  Patient discussed with Dr. Daryll Drown

## 2018-07-23 NOTE — Assessment & Plan Note (Signed)
Patient has history of vitamin B12 on supplementation. Level was rechecked at her last visit and still borderline low, however patient admits she was not taking her supplements regularly. She has fallen twice this year resulting in severe MSK strains. She reports both falls as mechanical, but in light of her chronic leg pains, discussed the importance of repleting vit B12 in case this is contributing. Instructed patient to take Vit B12 daily. Will recheck again in 1 month. If persistently low, she may need work up for malabsorption and pernicious anemia. Interestingly, she also has concomitant iron and vitamin D deficiency for which she is being treated.  -- Continue Vitamin B12 1,000 daily  -- Encouraged compliance -- Repeat 1 month

## 2018-07-23 NOTE — Assessment & Plan Note (Signed)
Patient reports recently receiving both IV iron infusion (unable to tolerate oral). Will repeat iron and ferritin in 1 month.

## 2018-07-23 NOTE — Assessment & Plan Note (Signed)
Patient has chronic pain and fibromyalgia that is poorly controlled with her current medications. She has tried gabapentin, SSRIs, NSAIDs, over the counter topicals, tylenol, and Voltaren gel without relief. She is currently on lyrica and cymbalta which worked for her initially, but no longer controls her pain. She reports being unable to exercise for multiple reasons including gyms being closed with COVID-19. She has tried heating pads with no relief. We have had difficulty getting her vitamin D, iron, and vitamin B12 levels repleted due to compliance and follow up issues. I have discussed my concerns with her that these deficiencies are likely contributing to her symptoms. She has very poor coping skills and is very distressed with about her pain. Muscle relaxers have been the only thing that seem to provide her relief. Discussed my concerns with patient that muscle relaxers are not a good long term option, and concerning to take with her chronic benzodiazepine use, especially since she has fallen twice now. I do not feel it is safe to prescribe opioids since she has not been able to show compliance with her other medications, adhere to our treatment plans for her multiple vitamin deficiencies, or be reliable in follow up. I have no other non opioid therapies to offer aside from lifestyle modifications to which she has already dismissed. Offered pain clinic referral and patient agreed.  -- Continue cymbalta & lyrica for now -- Pain clinic referral placed  -- Continue repleting

## 2018-07-23 NOTE — Assessment & Plan Note (Signed)
Patient has chronic hypoxic respiratory failure on 4L [place]. She has had extensive work up listed below.   Work up thus far has consisted of: -- 11/16/2016Sleep Study:did not meet criteria for OSA orsecond half of split study but did have some apneic episodes. -- 05/19/2017 ECHO(prior to hospitalization &hypoxia):EF 55-60%,G1DD, normal PA pressures (18), Intra-atrial aneurysmal dilation, ?PFO -- 10/2017 Hospitalized for viral bronchitis; persistent hypoxia on discharge -- 11/17/2017 Elevated D-dimer -- 12/04/2017 CTA Chest:no evidence of acute PE; dilation of pulmonary artery to 4cm ?PAH; some atelectic changes to RLL w/o nodules or consolidation -- 1/13/2020PFTs: No obstruction, mildrestriction, severe diffusion defectwith low DLCO (FEV1/FVC 93%, FEV1, 57,TLC 74%, DLCO 40) -- 02/09/2018 Repeat ECHO: EF 55-60%, mild right atrial dilation,no mention of PA pressuresor diastolic function,no mention of intraatrial septum aneurysmal  -- 03/18/2018 VQ Scan: Low probability of PE  Overall puzzling clinical picture. Unclear reason for persistent hypoxia. Work up thus far significant only for a dilated pulmonary artery on CTA chest but normal estimated pressure on two echocardiograms as well as PFTs with isolated decreased DLCO. Discussed informally with cardiology due to concern for [facility] and need for RHC. Cardiology recommended pulmonology referral first, as echocardiogram pressures were normal. Patient may benefit from a bubble study to look for intrapulmonary shunt.   -- Pulmonology referral placed

## 2018-07-26 DIAGNOSIS — J9620 Acute and chronic respiratory failure, unspecified whether with hypoxia or hypercapnia: Secondary | ICD-10-CM | POA: Diagnosis not present

## 2018-07-27 ENCOUNTER — Ambulatory Visit: Payer: Medicare Other | Attending: Physical Therapy | Admitting: Physical Therapy

## 2018-07-28 ENCOUNTER — Telehealth: Payer: Self-pay | Admitting: *Deleted

## 2018-07-28 DIAGNOSIS — K047 Periapical abscess without sinus: Secondary | ICD-10-CM

## 2018-07-28 MED ORDER — AMOXICILLIN-POT CLAVULANATE 875-125 MG PO TABS: 1.0000 | ORAL_TABLET | Freq: Two times a day (BID) | ORAL | 0 refills | Status: DC
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
## 2018-07-28 NOTE — Telephone Encounter (Signed)
Since it is draining what sounds like pus, will prescribe augmentin, but defnitive treatment will be dental intervention, so it is imperative she goes. Thanks!

## 2018-07-29 NOTE — Telephone Encounter (Signed)
Pt informed

## 2018-07-31 ENCOUNTER — Other Ambulatory Visit: Payer: Self-pay | Admitting: Internal Medicine

## 2018-07-31 DIAGNOSIS — R05 Cough: Secondary | ICD-10-CM

## 2018-07-31 DIAGNOSIS — R053 Chronic cough: Secondary | ICD-10-CM

## 2018-08-02 NOTE — Progress Notes (Signed)
Internal Medicine Clinic Attending  Case discussed with Dr. Guilloud at the time of the visit.  We reviewed the resident's history and exam and pertinent patient test results.  I agree with the assessment, diagnosis, and plan of care documented in the resident's note.  

## 2018-08-03 ENCOUNTER — Other Ambulatory Visit: Payer: Self-pay | Admitting: Internal Medicine

## 2018-08-03 ENCOUNTER — Encounter (HOSPITAL_COMMUNITY): Payer: Self-pay | Admitting: Psychiatry

## 2018-08-03 ENCOUNTER — Ambulatory Visit (INDEPENDENT_AMBULATORY_CARE_PROVIDER_SITE_OTHER): Payer: Medicare Other | Admitting: Psychiatry

## 2018-08-03 ENCOUNTER — Other Ambulatory Visit: Payer: Self-pay

## 2018-08-03 DIAGNOSIS — F411 Generalized anxiety disorder: Secondary | ICD-10-CM | POA: Diagnosis not present

## 2018-08-03 DIAGNOSIS — F331 Major depressive disorder, recurrent, moderate: Secondary | ICD-10-CM

## 2018-08-03 MED ORDER — REXULTI 3 MG PO TABS: 3.0000 mg | ORAL_TABLET | Freq: Every day | ORAL | 0 refills | Status: DC

## 2018-08-03 MED ORDER — HYDROXYZINE PAMOATE 50 MG PO CAPS: ORAL_CAPSULE | ORAL | 0 refills | Status: DC

## 2018-08-03 MED ORDER — CLORAZEPATE DIPOTASSIUM 15 MG PO TABS: ORAL_TABLET | ORAL | 0 refills | Status: DC

## 2018-08-03 NOTE — Telephone Encounter (Signed)
Will send one refill, she has a referral out to a pain clinic, so would anticipate this will be taken over by the pain clinic in the future.
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
## 2018-08-10 ENCOUNTER — Encounter: Payer: Self-pay | Admitting: Physical Therapy

## 2018-08-10 ENCOUNTER — Ambulatory Visit: Payer: Medicare Other | Attending: Internal Medicine | Admitting: Physical Therapy

## 2018-08-10 ENCOUNTER — Other Ambulatory Visit: Payer: Self-pay

## 2018-08-10 VITALS — HR 100

## 2018-08-10 DIAGNOSIS — M545 Low back pain, unspecified: Secondary | ICD-10-CM

## 2018-08-10 DIAGNOSIS — Z9181 History of falling: Secondary | ICD-10-CM | POA: Diagnosis not present

## 2018-08-10 DIAGNOSIS — G8929 Other chronic pain: Secondary | ICD-10-CM | POA: Diagnosis not present

## 2018-08-10 DIAGNOSIS — M6281 Muscle weakness (generalized): Secondary | ICD-10-CM | POA: Insufficient documentation

## 2018-08-10 DIAGNOSIS — R293 Abnormal posture: Secondary | ICD-10-CM

## 2018-08-10 DIAGNOSIS — R601 Generalized edema: Secondary | ICD-10-CM | POA: Insufficient documentation

## 2018-08-10 DIAGNOSIS — M542 Cervicalgia: Secondary | ICD-10-CM | POA: Insufficient documentation

## 2018-08-10 DIAGNOSIS — R2689 Other abnormalities of gait and mobility: Secondary | ICD-10-CM | POA: Diagnosis not present

## 2018-08-10 DIAGNOSIS — R2681 Unsteadiness on feet: Secondary | ICD-10-CM | POA: Diagnosis not present

## 2018-08-10 NOTE — Therapy (Signed)
[facility] [address] [place], [place], [address] Phone: [phone]   Fax:  [phone]  Physical Therapy Evaluation  Patient Details  Name: Lindsay [name] MRN: [id number] Date of Birth: 04-04-1970 Referring Provider (PT): Gilles Chiquito, MD   Encounter Date: 08/10/2018   CLINIC OPERATION CHANGES: [facility] is open at lower capacity following universal masking, social distancing, and patient screening.  The patient's COVID risk of complications score is 3.   PT End of Session - 08/10/18 1223    Visit Number  1    Number of Visits  25    Date for PT Re-Evaluation  11/06/18    Authorization Type  [employer]HC Medicare & Medicaid    PT Start Time  0705    PT Stop Time  0755    PT Time Calculation (min)  50 min    Equipment Utilized During Treatment  Gait belt;Oxygen   arrived without her oxygen on & PT had her put on her oxygen   Activity Tolerance  Patient tolerated treatment well;Patient limited by fatigue;Patient limited by pain    Behavior During Therapy  [facility] for tasks assessed/performed       Past Medical History:  Diagnosis Date  . Acid reflux   . Anemia    Iron Def  . Anorexia   . Chronic kidney disease    Nephrotic syndrome  . Colon polyp 2009  . Depression with anxiety   . Edema leg   . Fibromyalgia   . Hemorrhoids   . Hidradenitis suppurativa   . Hypertension   . IBS (irritable bowel syndrome)   . Low back pain   . Migraines   . Morbidly obese ([place])   . Neuromuscular disorder (HCC)    fibromyalgia  . Neuropathy   . Panic attacks   . Polyp of vocal cord or larynx   . Tonsil pain     Past Surgical History:  Procedure Laterality Date  . AXILLARY HIDRADENITIS EXCISION    . COLONOSCOPY WITH PROPOFOL N/A 05/25/2015   Procedure: COLONOSCOPY WITH PROPOFOL;  Surgeon: Milus Banister, MD;  Location: [facility];  Service: Endoscopy;  Laterality: N/A;  . HEMORRHOID SURGERY     with  Hidradenitis surgery   . INGUINAL HIDRADENITIS EXCISION    . TONSILLECTOMY  10/18/2010   by Dr. Wilburn Cornelia  . UPPER GASTROINTESTINAL ENDOSCOPY      Vitals:   08/10/18 0713 08/10/18 0731 08/10/18 0742  Pulse: 90 85 100  SpO2: 90% 96% 96%     Subjective Assessment - 08/10/18 0713    Subjective  This 48yo female was referred for PT evaluation on 06/30/2018 by Gilles Chiquito, MD with Fall on 06/22/2018. The wet pavement with mud & grass on it caused fall. She has history of fibromyalgia and now pain is worse.    Pertinent History  Chronic Kidney disease, Fibromyalgia, HTN, IBS, LBP, neuropathy,    Patient Stated Goals  To improve walking, move legs better. tolerating pain.    Currently in Pain?  Yes    Pain Score  8    in last week, worst 10/10, best 7/10   Pain Location  Neck    Pain Orientation  Upper;Right;Left   neck, upper trapezius   Pain Descriptors / Indicators  Aching    Pain Type  Chronic pain    Pain Onset  More than a month ago    Pain Frequency  Constant    Aggravating Factors   movement &  trying to lay down on both back or sides    Pain Relieving Factors  medication    Multiple Pain Sites  Yes    Pain Score  7   in last week, worst 9/10, best 5/10   Pain Location  Back    Pain Orientation  Right;Left;Mid;Lower    Pain Descriptors / Indicators  Aching;Sore    Pain Type  Chronic pain    Pain Radiating Towards  into buttocks & left leg to ankle    Pain Onset  More than a month ago    Pain Frequency  Constant    Aggravating Factors   walking, standing or sitting too lone    Pain Relieving Factors  lay on side         [facility] PT Assessment - 08/10/18 0700      Assessment   Medical Diagnosis  fall & chronic pain    Referring Provider (PT)  Gilles Chiquito, MD    Onset Date/Surgical Date  06/22/18    Hand Dominance  Right      Precautions   Precautions  Fall    Precaution Comments  wear oxygen 4 liters      Balance Screen   Has the patient fallen in the past 6  months  Yes    How many times?  2    Has the patient had a decrease in activity level because of a fear of falling?   Yes    Is the patient reluctant to leave their home because of a fear of falling?   Yes      [place] residence    Living Arrangements  Parent;Children   mother with CHF & dtr special needs   Type of [place] to enter;Other (comment)   inclined   Entrance Stairs-Number of Steps  2    Entrance Stairs-Rails  Right    Home Layout  One level    Home Equipment  None      Prior Function   Level of Independence  Independent;Independent with household mobility without device;Independent with community mobility without device    Vocation  On disability      Posture/Postural Control   Posture/Postural Control  Postural limitations    Postural Limitations  Rounded Shoulders;Forward head;Flexed trunk      ROM / Strength   AROM / PROM / Strength  AROM;Strength      AROM   Cervical Flexion  17   posterior neck pain   Cervical Extension  20   posterior neck pain   Cervical - Right Side Bend  20   left side muscle pain   Cervical - Left Side Bend  30   no pain   Cervical - Right Rotation  31   right side pain   Cervical - Left Rotation  22   left neck pain   Lumbar Flexion  50% ROM   posterior lumbar & gluteal pain   Lumbar Extension  25% of ROM    posterior lumbar pain   Lumbar - Right Side Bend  25% of ROM   left lumbar & right hip pain   Lumbar - Left Side Bend  25% of ROM   right lumbar & left hip pain   Lumbar - Right Rotation  25% of ROM   left lumbar & left hip pain   Lumbar - Left Rotation  25% of ROM  right lumbar & hip pain     Strength   Overall Strength  Deficits    Overall Strength Comments  BUEs grossly 5/5    Strength Assessment Site  Hip;Knee;Ankle    Right Hip Flexion  3-/5    Right Hip Extension  3-/5   tested in standing with BUE support   Right Hip ABduction  3-/5    tested in standing with BUE support   Left Hip Flexion  3-/5    Left Hip Extension  3-/5   tested in standing with BUE support   Left Hip ABduction  3-/5   tested in standing with BUE support   Right Knee Flexion  3-/5   tested in standing with BUE support   Right Knee Extension  3-/5    Left Knee Flexion  3-/5   tested in standing with BUE support   Left Knee Extension  3-/5    Right Ankle Dorsiflexion  4/5    Right Ankle Plantar Flexion  2+/5   tested in standing with BUE support   Left Ankle Dorsiflexion  4/5    Left Ankle Plantar Flexion  4/5   tested in standing with BUE support     Transfers   Transfers  Sit to Stand;Stand to Sit    Sit to Stand  5: Supervision;With upper extremity assist;With armrests;From chair/3-in-1   uses back of legs against chair to stabilize   Stand to Sit  5: Supervision;With upper extremity assist;With armrests;To chair/3-in-1   uses back of legs against chair to control / stabilize     Ambulation/Gait   Ambulation/Gait  Yes    Ambulation/Gait Assistance  5: Supervision    Ambulation Distance (Feet)  200 Feet    Assistive device  None;Other (Comment)   PT carrying oxygen tank   Gait Pattern  Step-through pattern;Decreased arm swing - left;Decreased arm swing - right;Decreased stride length;Right foot flat;Left foot flat;Right flexed knee in stance;Left flexed knee in stance;Shuffle;Lateral hip instability;Decreased trunk rotation;Trunk flexed;Wide base of support    Ambulation Surface  Indoor;Level    Gait velocity  2.34 ft/sec    Stairs  Yes    Stairs Assistance  5: Supervision    Stairs Assistance Details (indicate cue type and reason)  slow controlled movements    Stair Management Technique  Two rails;Step to pattern;Forwards   unable to perform with single rail   Number of Stairs  4    Height of Stairs  6      Standardized Balance Assessment   Standardized Balance Assessment  Berg Balance Test      Berg Balance Test   Sit to Stand   Able to stand  independently using hands    Standing Unsupported  Able to stand safely 2 minutes    Sitting with Back Unsupported but Feet Supported on Floor or Stool  Able to sit safely and securely 2 minutes    Stand to Sit  Controls descent by using hands    Transfers  Able to transfer safely, minor use of hands    Standing Unsupported with Eyes Closed  Able to stand 10 seconds with supervision    Standing Unsupported with Feet Together  Able to place feet together independently and stand for 1 minute with supervision    From Standing, Reach Forward with Outstretched Arm  Can reach forward >12 cm safely (5")    From Standing Position, Pick up Object from Floor  Unable to pick up shoe, but reaches 2-5  cm (1-2") from shoe and balances independently    From Standing Position, Turn to Look Behind Over each Shoulder  Looks behind one side only/other side shows less weight shift    Turn 360 Degrees  Able to turn 360 degrees safely but slowly    Standing Unsupported, Alternately Place Feet on Step/Stool  Able to complete 4 steps without aid or supervision    Standing Unsupported, One Foot in Front  Able to take small step independently and hold 30 seconds    Standing on One Leg  Tries to lift leg/unable to hold 3 seconds but remains standing independently    Total Score  39      Functional Gait  Assessment   Gait assessed   Yes    Gait Level Surface  Walks 20 ft, slow speed, abnormal gait pattern, evidence for imbalance or deviates 10-15 in outside of the 12 in walkway width. Requires more than 7 sec to ambulate 20 ft.    Change in Gait Speed  Cannot change speeds, deviates greater than 15 in outside 12 in walkway width, or loses balance and has to reach for wall or be caught.    Gait with Horizontal Head Turns  Performs head turns with moderate changes in gait velocity, slows down, deviates 10-15 in outside 12 in walkway width but recovers, can continue to walk.    Gait with Vertical Head Turns   Performs task with moderate change in gait velocity, slows down, deviates 10-15 in outside 12 in walkway width but recovers, can continue to walk.    Gait and Pivot Turn  Turns slowly, requires verbal cueing, or requires several small steps to catch balance following turn and stop    Step Over Obstacle  Cannot perform without assistance.    Gait with Narrow Base of Support  Ambulates less than 4 steps heel to toe or cannot perform without assistance.    Gait with Eyes Closed  Cannot walk 20 ft without assistance, severe gait deviations or imbalance, deviates greater than 15 in outside 12 in walkway width or will not attempt task.    Ambulating Backwards  Cannot walk 20 ft without assistance, severe gait deviations or imbalance, deviates greater than 15 in outside 12 in walkway width or will not attempt task.    Steps  Two feet to a stair, must use rail.    Total Score  5                Objective measurements completed on examination: See above findings.              PT Education - 08/10/18 0757    Education Details  POC and compliance with HEP to make gains    Person(s) Educated  Patient    Methods  Explanation;Verbal cues    Comprehension  Verbalized understanding;Need further instruction       PT Short Term Goals - 08/10/18 1834      PT SHORT TERM GOAL #1   Title  Patient verbalizes & demonstrates understanding of initial HEP. (All STGs Target Date: 09/09/2018)    Time  4    Period  Weeks    Status  New    Target Date  09/09/18      PT SHORT TERM GOAL #2   Title  Patient reports 25% improvement in cervical pain.    Time  4    Period  Weeks    Status  New    Target Date  09/09/18      PT SHORT TERM GOAL #3   Title  Patient reports 25% improvement in lumbar & buttocks / hip pain.    Time  4    Period  Weeks    Status  New    Target Date  09/09/18      PT SHORT TERM GOAL #4   Title  Berg Balance Test >/= 43/56    Time  4    Period  Weeks    Status   New    Target Date  09/09/18      PT SHORT TERM GOAL #5   Title  Patient ambulates with head turns to scan environment without change in pace or path.    Time  4    Period  Weeks    Status  New    Target Date  09/09/18      Additional Short Term Goals   Additional Short Term Goals  Yes      PT SHORT TERM GOAL #6   Title  Gait Velocity >2.5 ft/sec    Time  4    Period  Weeks    Status  New    Target Date  09/09/18        PT Long Term Goals - 08/10/18 1824      PT LONG TERM GOAL #1   Title  Patient verbalizes & demonstrates ongoing HEP / fitness plan and exercise program for [employer]. (All LTGs Target Date 11/06/2018)    Time  12    Period  Weeks    Status  New    Target Date  11/06/18      PT LONG TERM GOAL #2   Title  Patient reports cervical pain to </= 5/10 with activities.    Time  12    Period  Weeks    Status  New    Target Date  11/06/18      PT LONG TERM GOAL #3   Title  Patient reports lumbar & buttock / hip pain </= 4/10 with standing & gait activities.    Time  12    Period  Weeks    Status  New    Target Date  11/06/18      PT LONG TERM GOAL #4   Title  Berg Balance Test >/= 47/56 to indicate lower fall risk.    Time  12    Period  Weeks    Status  New    Target Date  11/06/18      PT LONG TERM GOAL #5   Title  Functional Gait Assessment >/= 15/30 to indicate lower fall risk.    Time  12    Period  Weeks    Status  New    Target Date  11/06/18      Additional Long Term Goals   Additional Long Term Goals  Yes      PT LONG TERM GOAL #6   Title  Gait Velocity >2.62 ft/sec to indicate fuller community mobility.    Time  12    Period  Weeks    Status  New    Target Date  11/06/18             Plan - 08/10/18 1758    Clinical Impression Statement  This 48yo female had fall 06/22/2018 resulting in flare up of her fibromylagia. She has neck pain ranging 7/10-10/10 and lumbar / buttocks / hip ranging from 5/10-9/10 which is higher  than prior  to fall. She reports that she went to [facility] to work out prior to [employer] pandemic closing facility. She has increased sedentary behavior with pain & limited knowledge of appropriate exercises.  She arrived to PT evaluation without oxygen & short walk to gym caused oxygen saturation 90%. PT had patient put on her oxygen at 4L prescribed. Oxygen saturation after standing balance 96% and after gait assessment 96%. Her heart rate increased from 85bpm to 100bpm with 5 minutes light intensity activity. Berg Balance test [phone] indicates high fall risk. Gait velocity of 2.34 ft/sec indicates limited community mobility. Functional Gait Assessment of 35/30 indicates high fall risk with gait activities. Patient would benefit from skilled care to improve functional mobility and safety.    Personal Factors and Comorbidities  Comorbidity 3+;Behavior Pattern;Fitness;Past/Current Experience;Social Background;Time since onset of injury/illness/exacerbation    Comorbidities  Chronic Kidney disease, Fibromyalgia, HTN, IBS, LBP, neuropathy,    Examination-Activity Limitations  Bend;Caring for Others;Carry;Lift;Locomotion Level;Reach Overhead;Squat;Stairs;Stand;Transfers    Examination-Participation Restrictions  Community Activity    Stability/Clinical Decision Making  Evolving/Moderate complexity    Clinical Decision Making  Moderate    Rehab Potential  Good    PT Frequency  2x / week    PT Duration  12 weeks    PT Treatment/Interventions  ADLs/Self Care Home Management;Aquatic Therapy;Cryotherapy;Electrical Stimulation;Moist Heat;Traction;Ultrasound;DME Instruction;Gait training;Stair training;Functional mobility training;Therapeutic activities;Therapeutic exercise;Balance training;Neuromuscular re-education;Patient/family education;Manual techniques;Passive range of motion;Dry needling;Vestibular;Spinal Manipulations;Joint Manipulations    PT Next Visit Plan  HEP for spinal flexibility & core stabilization, manual therapy  for pain management    Consulted and Agree with Plan of Care  Patient       Patient will benefit from skilled therapeutic intervention in order to improve the following deficits and impairments:  Abnormal gait, Decreased activity tolerance, Cardiopulmonary status limiting activity, Decreased balance, Decreased endurance, Decreased knowledge of use of DME, Decreased mobility, Decreased range of motion, Decreased strength, Dizziness, Increased edema, Impaired flexibility, Postural dysfunction, Obesity, Pain  Visit Diagnosis: 1. Muscle weakness (generalized)   2. History of falling   3. Unsteadiness on feet   4. Other abnormalities of gait and mobility   5. Chronic midline low back pain without sciatica   6. Cervicalgia   7. Abnormal posture   8. Generalized edema        Problem List Patient Active Problem List   Diagnosis Date Noted  . Fall 06/30/2018  . Frequent urination at night 04/23/2018  . Chronic cough 03/04/2018  . Dental infection 12/05/2017  . Right arm pain 12/05/2017  . Chronic respiratory failure with hypoxia ([place])   . Low serum vitamin B12 05/02/2017  . Palpitations 04/11/2017  . Nutritional anemia 06/17/2016  . Allergic rhinitis 06/12/2016  . Vitamin D deficiency, unspecified 06/12/2016  . Nicotine dependence 06/12/2016  . Menorrhagia 01/08/2016  . Migraines 01/08/2016  . Bilateral leg pain 11/29/2015  . Hemorrhoids 07/05/2015  . Fibromyalgia 07/05/2015  . Chronic leg pain 09/26/2014  . Healthcare maintenance 09/26/2014  . Morbid obesity ([place]) 07/04/2014  . Major depressive disorder, recurrent episode, moderate ([place]) 10/13/2012  . Generalized anxiety disorder 10/13/2012  . Chronic nausea 05/10/2010  . IBS (irritable bowel syndrome) 06/16/2007  . Essential hypertension 02/06/2006  . HIDRADENITIS 11/18/2005    Jeray Shugart PT, DPT 08/10/2018, 6:40 PM  [facility] [address] [place], [place], [address] Phone: [phone]   Fax:  [phone]  Name: Evadna Donaghy MRN: [id number] Date of Birth: 12-21-1970

## 2018-08-12 ENCOUNTER — Encounter: Payer: Self-pay | Admitting: Pulmonary Disease

## 2018-08-12 ENCOUNTER — Other Ambulatory Visit: Payer: Self-pay

## 2018-08-12 ENCOUNTER — Ambulatory Visit: Payer: Self-pay

## 2018-08-12 ENCOUNTER — Ambulatory Visit (INDEPENDENT_AMBULATORY_CARE_PROVIDER_SITE_OTHER): Payer: Medicare Other | Admitting: Pulmonary Disease

## 2018-08-12 VITALS — BP 136/62 | HR 99 | Temp 98.2°F | Ht 71.0 in | Wt 346.0 lb

## 2018-08-12 DIAGNOSIS — G4733 Obstructive sleep apnea (adult) (pediatric): Secondary | ICD-10-CM

## 2018-08-12 DIAGNOSIS — R05 Cough: Secondary | ICD-10-CM | POA: Diagnosis not present

## 2018-08-12 DIAGNOSIS — F17210 Nicotine dependence, cigarettes, uncomplicated: Secondary | ICD-10-CM

## 2018-08-12 DIAGNOSIS — R053 Chronic cough: Secondary | ICD-10-CM

## 2018-08-12 DIAGNOSIS — Z23 Encounter for immunization: Secondary | ICD-10-CM | POA: Diagnosis not present

## 2018-08-12 MED ORDER — SYMBICORT 160-4.5 MCG/ACT IN AERO: INHALATION_SPRAY | RESPIRATORY_TRACT | 1 refills | Status: DC

## 2018-08-12 MED ORDER — VARENICLINE TARTRATE 0.5 MG X 11 & 1 MG X 42 PO MISC: ORAL | 0 refills | Status: DC

## 2018-08-12 MED ORDER — ALBUTEROL SULFATE HFA 108 (90 BASE) MCG/ACT IN AERS: 2.0000 | INHALATION_SPRAY | Freq: Four times a day (QID) | RESPIRATORY_TRACT | 1 refills | Status: DC | PRN

## 2018-08-12 MED ORDER — ALBUTEROL SULFATE (2.5 MG/3ML) 0.083% IN NEBU: 2.5000 mg | INHALATION_SOLUTION | RESPIRATORY_TRACT | 12 refills | Status: DC

## 2018-08-12 MED ORDER — BUDESONIDE-FORMOTEROL FUMARATE 160-4.5 MCG/ACT IN AERO: 2.0000 | INHALATION_SPRAY | Freq: Two times a day (BID) | RESPIRATORY_TRACT | 0 refills | Status: DC

## 2018-08-12 NOTE — Progress Notes (Signed)
QUITLINE [place] REFERRAL:   Referring Organization Information:     Organization: [employer] [place]:  Beulah Gandy Zip code: [phone]  In order to receive a participant's Outcome Report, you must be a HIPAA-Covered Entity __x___ Yes, we are a HIPAA-Covered Entity __x___ Yes, I would like to receive an Outcome Report  Provider: Dr. Ander Slade  Fax:[phone] Phone: [phone]    Person being referred to [facility]:  Name: Lindsay [name] DOB: [birth year]  Address:  [address] [place] [address]  Gender: female Pregnant:no  Contact Information: [phone]   Language preference: English English  _YES_ I am ready to quit tobacco use within the next 30 days or have recently quit. I request [facility] to contact me to help me with my quit plan.  ____ I DO NOT give permission to [facility] to leave a message when contacting me.  Signature: Patient gave verbal permission to provider  Date: 08/12/2018   Check the BEST time for [facility] to call:  - 9am - 12pm - 12pm - 3pm - 3pm - 6pm - 6pm - 9pm - 9pm - 12am  NOTE: [facility] is open 24/7, but call attempts to participants are only made until midnight. Calls made over the weekend may be made at times outside of the 3-hour time frame selection.  Confidentiality Notice: This facsimile contains confidential information. If you have received this facsimile in error, please notify the sender immediately by telephone and confidentially dispose of the material. Do not review, disclose, copy, or distribute.  In Chart Review, highlight encounter and choose Route Choose 'Other" and enter [facility]ITLINE and the fax number [phone] Accept and send.

## 2018-08-12 NOTE — Patient Instructions (Signed)
Shortness of breath  Multiple factors contributing including obstructive sleep apnea, morbid obesity, some component of deconditioning  Continue Symbicort Continue albuterol use  Immunization-23 valent  Smoking cessation counseling Initiation of Chantix-watch for symptoms of worsening depression/anxiety/abnormal thoughts/dreams -Discontinue medication if above occurring  Will see back in the office in 4 to 6 weeks  Continue oxygen use and try and keep her saturations above 89%

## 2018-08-12 NOTE — Progress Notes (Signed)
Subjective:    Patient ID: Lindsay [name], female    DOB: [birth year], 48 y.o.   MRN: [id number]  Patient with a history of obstructive sleep apnea Recurrent bronchitis Recent pneumonia Being seen in the office today for shortness of breath  Active smoker On chronic home O2 [facility] a [facility] Did have influenza a few months later  Limited with activities of daily living Shortness of breath with about a block  History of PTSD History of depression   PFT in the past did reveal obstructive disease Previous echocardiogram showed normal systolic function Previous sleep study from 2016 did reveal evidence of sleep apnea     Review of Systems  Constitutional: Negative for fever and unexpected weight change.  HENT: Positive for dental problem, ear pain and sinus pressure. Negative for congestion, nosebleeds, postnasal drip, rhinorrhea, sneezing, sore throat and trouble swallowing.   Eyes: Negative for redness and itching.  Respiratory: Positive for cough and shortness of breath. Negative for chest tightness and wheezing.   Cardiovascular: Positive for palpitations and leg swelling.  Gastrointestinal: Positive for nausea. Negative for vomiting.  Genitourinary: Negative for dysuria.  Musculoskeletal: Negative for joint swelling.  Skin: Negative for rash.  Allergic/Immunologic: Positive for environmental allergies. Negative for food allergies and immunocompromised state.  Neurological: Positive for headaches.  Hematological: Does not bruise/bleed easily.  Psychiatric/Behavioral: Negative for dysphoric mood. The patient is not nervous/anxious.    Past Medical History:  Diagnosis Date  . Acid reflux   . Anemia    Iron Def  . Anorexia   . Chronic kidney disease    Nephrotic syndrome  . Colon polyp 2009  . Depression with anxiety   . Edema leg   . Fibromyalgia   . Hemorrhoids   . Hidradenitis suppurativa   . Hypertension   . IBS  (irritable bowel syndrome)   . Low back pain   . Migraines   . Morbidly obese ([place])   . Neuromuscular disorder (HCC)    fibromyalgia  . Neuropathy   . Panic attacks   . Polyp of vocal cord or larynx   . Tonsil pain    Social History   Socioeconomic History  . Marital status: Single    Spouse name: Not on file  . Number of children: 1  . Years of education: 45  . Highest education level: Not on file  Occupational History  . Occupation: Disabled  Social Needs  . Financial resource strain: Not on file  . Food insecurity    Worry: Not on file    Inability: Not on file  . Transportation needs    Medical: Not on file    Non-medical: Not on file  Tobacco Use  . Smoking status: Current Some Day Smoker    Packs/day: 0.50    Years: 25.00    Pack years: 12.50    Types: Cigarettes  . Smokeless tobacco: Never Used  . Tobacco comment: 1/2 pack a day 08/12/18  Substance and Sexual Activity  . Alcohol use: No    Alcohol/week: 0.0 standard drinks    Comment: occassionally  . Drug use: No  . Sexual activity: Not Currently    Birth control/protection: Implant  Lifestyle  . Physical activity    Days per week: Not on file    Minutes per session: Not on file  . Stress: Not on file  Relationships  . Social connections    Talks on phone: Not on file  Gets together: Not on file    Attends religious service: Not on file    Active member of club or organization: Not on file    Attends meetings of clubs or organizations: Not on file    Relationship status: Not on file  . Intimate partner violence    Fear of current or ex partner: Not on file    Emotionally abused: Not on file    Physically abused: Not on file    Forced sexual activity: Not on file  Other Topics Concern  . Not on file  Social History Narrative   Lives at home w/ her mother and daughter   Right-handed   Caffeine: about 3 Cokes per week   Family History  Problem Relation Age of Onset  . Diabetes Mother   .  Heart disease Mother        valve leak  . Anxiety disorder Mother   . Depression Mother   . High blood pressure Mother   . Kidney failure Mother   . Cancer Father        prostate  . Heart disease Father   . Learning disabilities Sister   . Depression Sister   . Depression Sister   . Anxiety disorder Sister   . Colon cancer Neg Hx        Objective:   Physical Exam Constitutional:      Appearance: Normal appearance.  HENT:     Head: Normocephalic and atraumatic.     Mouth/Throat:     Comments: Crowded oropharynx Eyes:     Extraocular Movements: Extraocular movements intact.     Pupils: Pupils are equal, round, and reactive to light.  Neck:     Musculoskeletal: Normal range of motion and neck supple.  Cardiovascular:     Rate and Rhythm: Normal rate and regular rhythm.     Pulses: Normal pulses.     Heart sounds: Normal heart sounds. No murmur.  Pulmonary:     Effort: Pulmonary effort is normal. No respiratory distress.     Breath sounds: Normal breath sounds. No stridor. No wheezing.  Abdominal:     General: There is no distension.     Palpations: There is no mass.     Tenderness: There is no abdominal tenderness.  Musculoskeletal: Normal range of motion.        General: Swelling present.     Right lower leg: Edema present.     Left lower leg: Edema present.  Skin:    General: Skin is warm and dry.     Coloration: Skin is not jaundiced or pale.  Neurological:     General: No focal deficit present.     Mental Status: She is alert.     Cranial Nerves: No cranial nerve deficit.     Sensory: No sensory deficit.  Psychiatric:        Mood and Affect: Mood normal.    Vitals:   08/12/18 0902  BP: 136/62  Pulse: 99  Temp: 98.2 F (36.8 C)  SpO2: 97%   Echo from 2020 did reveal normal systolic function Polysomnogram 2016-moderate obstructive sleep apnea    Assessment & Plan:  Chronic respiratory failure -Likely multifactorial -Underlying respiratory  disease-COPD -Recent echo was unremarkable -Continue oxygen supplementation  .  Obstructive lung disease -Bronchodilators -Symbicort -Nebulizer use  Obstructive sleep apnea -We will repeat a split-night study -Last study was 2016 showing moderate obstructive sleep apnea -has gained weight   Fatigue/tiredness -Multifactorial  .  History  of depression .  History of PTSD  Plan: Smoking cessation counseling Initiated [facility] Immunization-we will get 23 valent pneumonia shot Continue oxygen supplementation Obtain split-night study  The most significant change in medications losing weight and quitting smoking Encouraged to call with any significant concerns  Multifactorial reasons for shortness of breath

## 2018-08-13 ENCOUNTER — Telehealth: Payer: Self-pay | Admitting: Internal Medicine

## 2018-08-13 NOTE — Telephone Encounter (Signed)
Pt wants a letter to have an escort to ride with her on the bus; pt contact [phone]  Fax# [phone] [facility]

## 2018-08-14 ENCOUNTER — Telehealth: Payer: Self-pay | Admitting: Internal Medicine

## 2018-08-14 NOTE — Telephone Encounter (Signed)
I think she needs to be evaluated. Please ask her to go to urgent care today or make a follow up appointment for Monday. Thank you

## 2018-08-14 NOTE — Telephone Encounter (Signed)
Called pt - feet and ankles are swollen bilaterally; right knee hurts. Stated they have been swollen intermittently X 2 weeks. She went to sleep and when she woke up; they were swollen and "shiny". Stated BP 140/90.

## 2018-08-14 NOTE — Telephone Encounter (Signed)
Called pt - she does not think it had been done but she will call transportation to find out b/c the letter needs to be faxed to them.

## 2018-08-14 NOTE — Telephone Encounter (Signed)
Pt took nap and legs are swollen, pt contact [phone]

## 2018-08-14 NOTE — Telephone Encounter (Signed)
Has this been addressed?

## 2018-08-14 NOTE — Telephone Encounter (Signed)
Thank you. Let me know

## 2018-08-14 NOTE — Telephone Encounter (Signed)
Talked to pt - informed she needs to be seen by someone; suggested UC. Stated she will go to UC.

## 2018-08-14 NOTE — Telephone Encounter (Signed)
Also what is the reason she needs a companion?

## 2018-08-17 ENCOUNTER — Other Ambulatory Visit: Payer: Self-pay | Admitting: Internal Medicine

## 2018-08-17 DIAGNOSIS — M797 Fibromyalgia: Secondary | ICD-10-CM

## 2018-08-17 NOTE — Telephone Encounter (Signed)
I agree. Thank you.

## 2018-08-17 NOTE — Telephone Encounter (Signed)
F/U call - pt stated she did not have transportation to go to UC over the w/e. Stated her ankles/ feet are still "the same". Call transferred to front office to schedule an [facility] appt - appt scheduled on Thurs 7/23 @ 1015 AM.

## 2018-08-20 ENCOUNTER — Ambulatory Visit: Payer: Medicare Other | Admitting: Physical Therapy

## 2018-08-20 ENCOUNTER — Ambulatory Visit (INDEPENDENT_AMBULATORY_CARE_PROVIDER_SITE_OTHER): Payer: Medicare Other | Admitting: Internal Medicine

## 2018-08-20 ENCOUNTER — Other Ambulatory Visit: Payer: Self-pay

## 2018-08-20 VITALS — BP 119/81 | HR 101 | Temp 99.1°F | Ht 71.0 in | Wt 340.6 lb

## 2018-08-20 DIAGNOSIS — E538 Deficiency of other specified B group vitamins: Secondary | ICD-10-CM | POA: Diagnosis not present

## 2018-08-20 DIAGNOSIS — G8929 Other chronic pain: Secondary | ICD-10-CM

## 2018-08-20 DIAGNOSIS — E559 Vitamin D deficiency, unspecified: Secondary | ICD-10-CM | POA: Diagnosis not present

## 2018-08-20 DIAGNOSIS — D539 Nutritional anemia, unspecified: Secondary | ICD-10-CM

## 2018-08-20 DIAGNOSIS — M79604 Pain in right leg: Secondary | ICD-10-CM | POA: Diagnosis not present

## 2018-08-20 DIAGNOSIS — E611 Iron deficiency: Secondary | ICD-10-CM

## 2018-08-20 DIAGNOSIS — D509 Iron deficiency anemia, unspecified: Secondary | ICD-10-CM

## 2018-08-20 DIAGNOSIS — Z793 Long term (current) use of hormonal contraceptives: Secondary | ICD-10-CM

## 2018-08-20 DIAGNOSIS — N921 Excessive and frequent menstruation with irregular cycle: Secondary | ICD-10-CM | POA: Diagnosis not present

## 2018-08-20 DIAGNOSIS — M79605 Pain in left leg: Secondary | ICD-10-CM | POA: Diagnosis not present

## 2018-08-20 DIAGNOSIS — Z79899 Other long term (current) drug therapy: Secondary | ICD-10-CM

## 2018-08-20 DIAGNOSIS — G629 Polyneuropathy, unspecified: Secondary | ICD-10-CM

## 2018-08-20 MED ORDER — GABAPENTIN 100 MG PO CAPS: 100.0000 mg | ORAL_CAPSULE | Freq: Three times a day (TID) | ORAL | 0 refills | Status: DC

## 2018-08-20 MED ORDER — POLYSACCHARIDE IRON COMPLEX 150 MG PO CAPS: 150.0000 mg | ORAL_CAPSULE | Freq: Every day | ORAL | 5 refills | Status: DC

## 2018-08-20 MED ORDER — CAPSAICIN 0.075 % EX GEL: 1.0000 "application " | Freq: Four times a day (QID) | CUTANEOUS | 0 refills | Status: DC | PRN

## 2018-08-20 MED ORDER — CYANOCOBALAMIN 1000 MCG/ML IJ SOLN
1000.0000 ug | Freq: Once | INTRAMUSCULAR | Status: AC
  Administered 2018-08-20: 1000 ug via INTRAMUSCULAR

## 2018-08-20 NOTE — Progress Notes (Signed)
   CC: leg pain   HPI:  [name] is a 48 y.o. with PMH as below presenting with LE pain. She has a history of chronic bilateral lower extremity pain with burning and tingling thought to be secondary to iron deficiency anemia and Vitamin B12 deficiency. Her pain is increased today with numbness, burning and tingling in both of her feet in addition to tingling in her hands now, which is fairly new. She endorses diffuse weakness in her hands as well. She has chronic shortness of breath and recently saw her pulmonologist, this is not increased from usual. She has been taking her OTC vitamin D and B12. She is not taking po iron supplement but last had an iron infusion two weeks ago. She has been taking a lot of naproxen and tylenol for her pain, and it is not helping. She will be establishing care with pain clinic soon, but is worried because they stated they would not be able to prescribe medication for pain during her first visit.  She also has a history of heavy menstrual bleeding, thought to be the source of her anemia. She has the nexplanon, which she thinks is what causes her heavy bleeding. She states she is supposed to follow-up with ob/gyn July 30th.   Please see A&P for assessment of the patient's acute and chronic medical conditions.    Past Medical History:  Diagnosis Date  . Acid reflux   . Anemia    Iron Def  . Anorexia   . Chronic kidney disease    Nephrotic syndrome  . Colon polyp 2009  . Depression with anxiety   . Edema leg   . Fibromyalgia   . Hemorrhoids   . Hidradenitis suppurativa   . Hypertension   . IBS (irritable bowel syndrome)   . Low back pain   . Migraines   . Morbidly obese ([place])   . Neuromuscular disorder (HCC)    fibromyalgia  . Neuropathy   . Panic attacks   . Polyp of vocal cord or larynx   . Tonsil pain    Review of Systems:   ROS negative except as noted in HPI   Physical Exam:  Constitution: mild distress, obese Eyes: no conjunctival  pallor, no icterus Cardio: RRR, no m/r/g; no LE edema  MSK: LE and UE b/l diffusely TTP, full ROM intact strength 4/5 secondary to pain Neuro: alert and oriented, anxious affect  Skin: clean dry and intact    Vitals:   08/20/18 1014  BP: 119/81  Pulse: (!) 101  Temp: 99.1 F (37.3 C)  TempSrc: Oral  SpO2: 95%  Weight: (!) 340 lb 9.6 oz (154.5 kg)  Height: 5\' 11"  (1.803 m)    Assessment & Plan:   See Encounters Tab for problem based charting.  Patient discussed with Dr. Rebeca Alert

## 2018-08-20 NOTE — Patient Instructions (Addendum)
Thank you for allowing [name] to provide your care today. Today we discussed your lower leg pain and anemia.   I have ordered the following labs for you:  B12, Vitamin D, complete metabolic panel    I will call if any are abnormal.    I am giving your a B12 shot for your B12 deficiency.   Today we made the following changes to your medications:   Please START taking  Nu-iron 150 mg tablet - please take one tablet once per day   Gabapentin 100 mg tablet - please take one tablet three times per day. If you have dizziness or weakness please stop this medication.   Take Vitamin D 2,000 Units once per day for one month. After this decrease to 1,000 Units per day.   Please continue to take your vitamin B12 daily.   Please make sure to follow-up with your Ob/Gyn.     Please follow-up in two weeks for B12 shot. Follow-up in two months for repeat labs.    Should you have any questions or concerns please call the internal medicine clinic at [phone].

## 2018-08-21 LAB — IRON AND TIBC
Iron Saturation: 40 % (ref 15–55)
Iron: 131 ug/dL (ref 27–159)
Total Iron Binding Capacity: 326 ug/dL (ref 250–450)
UIBC: 195 ug/dL (ref 131–425)

## 2018-08-21 LAB — FERRITIN: Ferritin: 23 ng/mL (ref 15–150)

## 2018-08-21 NOTE — Assessment & Plan Note (Signed)
See chronic leg pain for A&P details.   - start nu-iron 150 mg po  - CBC, ferritin  - f/u with ob/gyn 7/30

## 2018-08-21 NOTE — Assessment & Plan Note (Signed)
Increase vitamin D to 2,000 U for one month, then 1,000 U. See leg pain for more information  - vitamin D level today  - f/u two months for repeat labs

## 2018-08-21 NOTE — Assessment & Plan Note (Signed)
She is currently on duloxetine 60 mg qd and pregabalin 100 mg bid for her pain. Had extensive discussion that her pain and neuropathy is related her anemia from iron deficiency and B12 deficiency in addition to Vitamin D deficiency and that control of these deficiencies are very important to improving her pain. B12, ferritin, vitamin D consistently decreased, last labs in May. There has been concern in the past for adherence to necessary medications and vitamins. She also has not tolerated po ferrous sulfate in the past.  She used to be on gabapentin alone, but this was stopped and switched to pregabalin. I spent much of the visit explaining that we needed to get control of anemia and vitamin D deficiency and this would likely help her pain significantly.   - counseled on high dose nsaid use - continue ppi  - trial dose of triple therapy - start gabapentin 100 mg tid - can increase to 200 mg tid if this does not help, cont. pregabalin and duloxetine  - f/u one week telehealth visit, f/u two weeks for repeat B12 shot, f/u two months for repeat labs  - capsaicin cream prn  - B12 shot today - start nu-iron  - Vit D level, B12, MMA, CBC, ferritin - increase Vitamin D to 2000 U for one months, then decrease to 1,000 U - continue physical therapy  - encouraged her to f/u with gyn

## 2018-08-21 NOTE — Assessment & Plan Note (Signed)
See chronic leg pain for details.   - B12 shot today - B12 level obtained before shot  - f/u two weeks for next injection  - continue B12 supplementation

## 2018-08-24 ENCOUNTER — Telehealth: Payer: Self-pay | Admitting: Physical Therapy

## 2018-08-24 ENCOUNTER — Ambulatory Visit: Payer: Medicare Other | Admitting: Physical Therapy

## 2018-08-24 ENCOUNTER — Encounter: Payer: Self-pay | Admitting: Physical Therapy

## 2018-08-24 ENCOUNTER — Other Ambulatory Visit: Payer: Self-pay

## 2018-08-24 DIAGNOSIS — R601 Generalized edema: Secondary | ICD-10-CM | POA: Diagnosis not present

## 2018-08-24 DIAGNOSIS — Z9181 History of falling: Secondary | ICD-10-CM | POA: Diagnosis not present

## 2018-08-24 DIAGNOSIS — M6281 Muscle weakness (generalized): Secondary | ICD-10-CM

## 2018-08-24 DIAGNOSIS — R2689 Other abnormalities of gait and mobility: Secondary | ICD-10-CM

## 2018-08-24 DIAGNOSIS — R293 Abnormal posture: Secondary | ICD-10-CM

## 2018-08-24 DIAGNOSIS — G8929 Other chronic pain: Secondary | ICD-10-CM | POA: Diagnosis not present

## 2018-08-24 DIAGNOSIS — R2681 Unsteadiness on feet: Secondary | ICD-10-CM | POA: Diagnosis not present

## 2018-08-24 DIAGNOSIS — M542 Cervicalgia: Secondary | ICD-10-CM

## 2018-08-24 DIAGNOSIS — J9611 Chronic respiratory failure with hypoxia: Secondary | ICD-10-CM

## 2018-08-24 DIAGNOSIS — M545 Low back pain: Secondary | ICD-10-CM | POA: Diagnosis not present

## 2018-08-24 LAB — CBC
Hematocrit: 50.3 % — ABNORMAL HIGH (ref 34.0–46.6)
Hemoglobin: 14.9 g/dL (ref 11.1–15.9)
MCH: 24.5 pg — ABNORMAL LOW (ref 26.6–33.0)
MCHC: 29.6 g/dL — ABNORMAL LOW (ref 31.5–35.7)
MCV: 83 fL (ref 79–97)
Platelets: 375 10*3/uL (ref 150–450)
RBC: 6.07 x10E6/uL — ABNORMAL HIGH (ref 3.77–5.28)
RDW: 22 % — ABNORMAL HIGH (ref 11.7–15.4)
WBC: 7 10*3/uL (ref 3.4–10.8)

## 2018-08-24 LAB — VITAMIN D 1,25 DIHYDROXY
Vitamin D 1, 25 (OH)2 Total: 54 pg/mL
Vitamin D2 1, 25 (OH)2: <10 pg/mL
Vitamin D3 1, 25 (OH)2: 53 pg/mL

## 2018-08-24 LAB — VITAMIN B12: Vitamin B-12: 489 pg/mL (ref 232–1245)

## 2018-08-24 LAB — METHYLMALONIC ACID, SERUM: Methylmalonic Acid: 169 nmol/L (ref 0–378)

## 2018-08-24 NOTE — Patient Instructions (Signed)
Access Code: [email or web address]  URL: [email or web address]  Date: 08/24/2018  Prepared by: Jamey Reas   Exercises Seated Cervical Traction - 3 reps - 1 sets - 10 hold - 1x daily - 7x weekly Seated Assisted Cervical Rotation with Towel - 3 reps - 1 sets - 10 hold - 1x daily - 7x weekly Seated Levator Scapulae Stretch - 3 reps - 1 sets - 10 hold - 1x daily - 7x weekly Seated Lumbar Flexion Stretch - 2 reps - 1 sets - 30 hold - 1x daily -7x weekly Standing Forward Trunk Flexion - 10 reps - 1 sets - 5 hold - 3x daily - 7x weekly

## 2018-08-24 NOTE — Telephone Encounter (Signed)
I have ordered this walker for Lindsay [name].    Thank you!  Gilles Chiquito, MD

## 2018-08-24 NOTE — Therapy (Signed)
[name] [address] [place], [place], [address] Phone: [phone]   Fax:  [phone]  Physical Therapy Treatment  Patient Details  Name: Lindsay [name] MRN: [id number] Date of Birth: April 07, 1970 Referring Provider (PT): Gilles Chiquito, MD   Encounter Date: 08/24/2018   CLINIC OPERATION CHANGES: Outpatient [facility] is open at lower capacity following universal masking, social distancing, and patient screening.  The patient's COVID risk of complications score is 3.   PT End of Session - 08/24/18 0752    Visit Number  2    Number of Visits  25    Date for PT Re-Evaluation  11/06/18    Authorization Type  [employer] Medicare & Medicaid    PT Start Time  0737    PT Stop Time  0741    PT Time Calculation (min)  46 min    Equipment Utilized During Treatment  Gait belt;Oxygen   arrived without her oxygen on & PT had her put on her oxygen   Activity Tolerance  Patient tolerated treatment well;Patient limited by fatigue;Patient limited by pain    Behavior During Therapy  [facility] for tasks assessed/performed       Past Medical History:  Diagnosis Date  . Acid reflux   . Anemia    Iron Def  . Anorexia   . Chronic kidney disease    Nephrotic syndrome  . Colon polyp 2009  . Depression with anxiety   . Edema leg   . Fibromyalgia   . Hemorrhoids   . Hidradenitis suppurativa   . Hypertension   . IBS (irritable bowel syndrome)   . Low back pain   . Migraines   . Morbidly obese ([name])   . Neuromuscular disorder (HCC)    fibromyalgia  . Neuropathy   . Panic attacks   . Polyp of vocal cord or larynx   . Tonsil pain     Past Surgical History:  Procedure Laterality Date  . AXILLARY HIDRADENITIS EXCISION    . COLONOSCOPY WITH PROPOFOL N/A 05/25/2015   Procedure: COLONOSCOPY WITH PROPOFOL;  Surgeon: Milus Banister, MD;  Location: [facility];  Service: Endoscopy;  Laterality: N/A;  . HEMORRHOID SURGERY     with  Hidradenitis surgery   . INGUINAL HIDRADENITIS EXCISION    . TONSILLECTOMY  10/18/2010   by Dr. Wilburn Cornelia  . UPPER GASTROINTESTINAL ENDOSCOPY      There were no vitals filed for this visit.  Subjective Assessment - 08/24/18 0658    Subjective  She fell last night when tripped over uneven concrete. She hit her left UE & LE.    Pertinent History  Chronic Kidney disease, Fibromyalgia, HTN, IBS, LBP, neuropathy,    Patient Stated Goals  To improve walking, move legs better. tolerating pain.    Currently in Pain?  Yes    Pain Score  8    in last week, range 8/10-10/10   Pain Location  Neck    Pain Orientation  Posterior;Right;Left    Pain Descriptors / Indicators  Aching;Burning    Pain Type  Chronic pain    Pain Onset  More than a month ago    Pain Frequency  Constant    Aggravating Factors   laying down & turning    Pain Relieving Factors  nothing    Pain Score  10   in last week, range 10/10   Pain Location  Back    Pain Orientation  Posterior    Pain Descriptors /  Indicators  Aching;Burning    Pain Type  Chronic pain    Pain Radiating Towards  hips/buttocks bilateral    Pain Onset  More than a month ago    Pain Frequency  Constant    Aggravating Factors   nothing       Patient arrived with oxygen tank but not connected. She ambulated 100' to gym with supervision. SpO2 91% HR 105 PT recommended applying O2  See patient education & instructions.   PT demo safe utilization of rollator walker including ability to transport oxygen & enable seated rest.  Pt ambulated 100' to lobby with rollator walker with improved posture & fluency.  SpO2 90% HR 92  Pt reports body weight a little over 350# and ht 5'11" Pt in agreement with PT requesting Rx from MD for Bariatric Tall Adult rollator walker                        PT Education - 08/24/18 0733    Education Details  spinal anatomy & stenosis and relationship to posture,  low oxygen rates & hypoxia,   initiated HEP (4 seated),  use & benefits to rollator walker use    Person(s) Educated  Patient    Methods  Explanation;Demonstration;Verbal cues;Handout    Comprehension  Verbalized understanding;Returned demonstration;Verbal cues required;Tactile cues required;Need further instruction       PT Short Term Goals - 08/10/18 1834      PT SHORT TERM GOAL #1   Title  Patient verbalizes & demonstrates understanding of initial HEP. (All STGs Target Date: 09/09/2018)    Time  4    Period  Weeks    Status  New    Target Date  09/09/18      PT SHORT TERM GOAL #2   Title  Patient reports 25% improvement in cervical pain.    Time  4    Period  Weeks    Status  New    Target Date  09/09/18      PT SHORT TERM GOAL #3   Title  Patient reports 25% improvement in lumbar & buttocks / hip pain.    Time  4    Period  Weeks    Status  New    Target Date  09/09/18      PT SHORT TERM GOAL #4   Title  Berg Balance Test >/= 43/56    Time  4    Period  Weeks    Status  New    Target Date  09/09/18      PT SHORT TERM GOAL #5   Title  Patient ambulates with head turns to scan environment without change in pace or path.    Time  4    Period  Weeks    Status  New    Target Date  09/09/18      Additional Short Term Goals   Additional Short Term Goals  Yes      PT SHORT TERM GOAL #6   Title  Gait Velocity >2.5 ft/sec    Time  4    Period  Weeks    Status  New    Target Date  09/09/18        PT Long Term Goals - 08/10/18 1824      PT LONG TERM GOAL #1   Title  Patient verbalizes & demonstrates ongoing HEP / fitness plan and exercise program for [employer]. (All LTGs Target Date 11/06/2018)  Time  12    Period  Weeks    Status  New    Target Date  11/06/18      PT LONG TERM GOAL #2   Title  Patient reports cervical pain to </= 5/10 with activities.    Time  12    Period  Weeks    Status  New    Target Date  11/06/18      PT LONG TERM GOAL #3   Title  Patient reports lumbar &  buttock / hip pain </= 4/10 with standing & gait activities.    Time  12    Period  Weeks    Status  New    Target Date  11/06/18      PT LONG TERM GOAL #4   Title  Berg Balance Test >/= 47/56 to indicate lower fall risk.    Time  12    Period  Weeks    Status  New    Target Date  11/06/18      PT LONG TERM GOAL #5   Title  Functional Gait Assessment >/= 15/30 to indicate lower fall risk.    Time  12    Period  Weeks    Status  New    Target Date  11/06/18      Additional Long Term Goals   Additional Long Term Goals  Yes      PT LONG TERM GOAL #6   Title  Gait Velocity >2.62 ft/sec to indicate fuller community mobility.    Time  12    Period  Weeks    Status  New    Target Date  11/06/18            Plan - 08/24/18 0752    Clinical Impression Statement  Patient would benefit from rollator walker for community mobility as would decrease falls, energy expenditure & transport her oxygen. She has general understanding to initial HEP.    Personal Factors and Comorbidities  Comorbidity 3+;Behavior Pattern;Fitness;Past/Current Experience;Social Background;Time since onset of injury/illness/exacerbation    Comorbidities  Chronic Kidney disease, Fibromyalgia, HTN, IBS, LBP, neuropathy,    Examination-Activity Limitations  Bend;Caring for Others;Carry;Lift;Locomotion Level;Reach Overhead;Squat;Stairs;Stand;Transfers    Examination-Participation Restrictions  Community Activity    Stability/Clinical Decision Making  Evolving/Moderate complexity    Rehab Potential  Good    PT Frequency  2x / week    PT Duration  12 weeks    PT Treatment/Interventions  ADLs/Self Care Home Management;Aquatic Therapy;Cryotherapy;Electrical Stimulation;Moist Heat;Traction;Ultrasound;DME Instruction;Gait training;Stair training;Functional mobility training;Therapeutic activities;Therapeutic exercise;Balance training;Neuromuscular re-education;Patient/family education;Manual techniques;Passive range of  motion;Dry needling;Vestibular;Spinal Manipulations;Joint Manipulations    PT Next Visit Plan  check & add to HEP for spinal flexibility & core stabilization, gait with rollator walker including ramps & curbs,  manual therapy for pain management    PT Home Exercise Plan  Access Code: [email or web address]    Consulted and Agree with Plan of Care  Patient       Patient will benefit from skilled therapeutic intervention in order to improve the following deficits and impairments:  Abnormal gait, Decreased activity tolerance, Cardiopulmonary status limiting activity, Decreased balance, Decreased endurance, Decreased knowledge of use of DME, Decreased mobility, Decreased range of motion, Decreased strength, Dizziness, Increased edema, Impaired flexibility, Postural dysfunction, Obesity, Pain  Visit Diagnosis: 1. Muscle weakness (generalized)   2. Cervicalgia   3. Unsteadiness on feet   4. Other abnormalities of gait and mobility   5. Chronic midline low back pain without sciatica  6. Abnormal posture        Problem List Patient Active Problem List   Diagnosis Date Noted  . Fall 06/30/2018  . Frequent urination at night 04/23/2018  . Chronic cough 03/04/2018  . Dental infection 12/05/2017  . Right arm pain 12/05/2017  . Chronic respiratory failure with hypoxia ([place])   . Low serum vitamin B12 05/02/2017  . Palpitations 04/11/2017  . Nutritional anemia 06/17/2016  . Allergic rhinitis 06/12/2016  . Vitamin D deficiency, unspecified 06/12/2016  . Nicotine dependence 06/12/2016  . Menorrhagia 01/08/2016  . Migraines 01/08/2016  . Bilateral leg pain 11/29/2015  . Hemorrhoids 07/05/2015  . Fibromyalgia 07/05/2015  . Chronic leg pain 09/26/2014  . Healthcare maintenance 09/26/2014  . Morbid obesity ([place]) 07/04/2014  . Major depressive disorder, recurrent episode, moderate ([place]) 10/13/2012  . Generalized anxiety disorder 10/13/2012  . Chronic nausea 05/10/2010  . IBS (irritable bowel syndrome)  06/16/2007  . Essential hypertension 02/06/2006  . HIDRADENITIS 11/18/2005    Jina Olenick PT, DPT 08/24/2018, 7:56 AM  [facility] [address] [place], [place], [address] Phone: [phone]   Fax:  [phone]  Name: Jakeya Gherardi MRN: [id number] Date of Birth: Nov 28, 1970

## 2018-08-24 NOTE — Telephone Encounter (Signed)
Lindsay [name] would benefit from a Bariatric Tall Adult Rollator Walker with oxygen tank transporter. She weighs over 350# & 5'11".  Use of rollator walker decreases her fall risk, lower energy expenditure and transporting oxygen. Please place a prescription for Bariatric Tall Adult Rollator Walker with oxygen tank transporter Please contact me with any questions, Jamey Reas, PT, DPT PT Specializing in [place] 08/24/2018@ 8:07 AM Phone:  [phone]  Fax:  [phone] [facility] [address] [place], [place] [address]

## 2018-08-25 ENCOUNTER — Encounter (HOSPITAL_BASED_OUTPATIENT_CLINIC_OR_DEPARTMENT_OTHER): Payer: Medicare Other

## 2018-08-25 ENCOUNTER — Other Ambulatory Visit: Payer: Self-pay | Admitting: Internal Medicine

## 2018-08-25 DIAGNOSIS — J9620 Acute and chronic respiratory failure, unspecified whether with hypoxia or hypercapnia: Secondary | ICD-10-CM | POA: Diagnosis not present

## 2018-08-25 DIAGNOSIS — R05 Cough: Secondary | ICD-10-CM

## 2018-08-25 DIAGNOSIS — R053 Chronic cough: Secondary | ICD-10-CM

## 2018-08-26 NOTE — Telephone Encounter (Signed)
This was filled in July according to the chart. Does she need a refill?

## 2018-08-27 ENCOUNTER — Ambulatory Visit: Payer: Medicare Other | Admitting: Internal Medicine

## 2018-08-27 ENCOUNTER — Other Ambulatory Visit: Payer: Self-pay

## 2018-08-27 ENCOUNTER — Other Ambulatory Visit: Payer: Self-pay | Admitting: Internal Medicine

## 2018-08-27 ENCOUNTER — Ambulatory Visit: Payer: Medicare Other | Admitting: Family Medicine

## 2018-08-27 ENCOUNTER — Ambulatory Visit: Payer: Medicare Other | Admitting: Physical Therapy

## 2018-08-27 DIAGNOSIS — M79604 Pain in right leg: Secondary | ICD-10-CM

## 2018-08-27 DIAGNOSIS — M79605 Pain in left leg: Secondary | ICD-10-CM

## 2018-08-27 DIAGNOSIS — G629 Polyneuropathy, unspecified: Secondary | ICD-10-CM

## 2018-08-27 DIAGNOSIS — W19XXXA Unspecified fall, initial encounter: Secondary | ICD-10-CM

## 2018-08-27 MED ORDER — GABAPENTIN 100 MG PO CAPS: 300.0000 mg | ORAL_CAPSULE | Freq: Three times a day (TID) | ORAL | 0 refills | Status: DC

## 2018-08-27 NOTE — Progress Notes (Signed)
   CC: follow-up leg pain  This is a telephone encounter between [name] and Lindsay [name] on 08/27/2018 for follow-up leg pain. The visit was conducted with the patient located at home and Lindsay [name] at [facility]. The patient's identity was confirmed using their DOB and current address. The patient has consented to being evaluated through a telephone encounter and understands the associated risks (an examination cannot be done and the patient may need to come in for an appointment) / benefits (allows the patient to remain at home, decreasing exposure to coronavirus). I personally spent 14 minutes on medical discussion.   HPI:  [name] is a 48 y.o. with PMH as below presenting via telehealth visit for follow-up after appointment last week for bilateral leg pain and neuropathy. She continues to have lower extremity pain. At her last visit gabapentin 100 mg tid was started with plans to increase to 200 mg tid if no effect. She states she has increased to 400 mg tid and this is not helping at all.  She states she also fell four days ago when she tripped on the sidewalk and fell onto a chair she was trying to carry. She did not hit her head but has a lot of bruises on her arms and is in a lot of pain. She went to PT the next and was barely able to participate and had to cancel her appointment today due to the pain. She is very concerned that she is having a lot of pain in her legs and where she fell and that she cannot participate in PT due to her current pain.  Please see A&P for assessment of the patient's acute and chronic medical conditions.   Past Medical History:  Diagnosis Date  . Acid reflux   . Anemia    Iron Def  . Anorexia   . Chronic kidney disease    Nephrotic syndrome  . Colon polyp 2009  . Depression with anxiety   . Edema leg   . Fibromyalgia   . Hemorrhoids   . Hidradenitis suppurativa   . Hypertension   . IBS (irritable bowel syndrome)   . Low back pain   .  Migraines   . Morbidly obese ([place])   . Neuromuscular disorder (HCC)    fibromyalgia  . Neuropathy   . Panic attacks   . Polyp of vocal cord or larynx   . Tonsil pain    Review of Systems:   ROS negative except as noted in HPI.     Assessment & Plan:   See Encounters Tab for problem based charting.  Patient discussed with Dr. Evette Doffing

## 2018-08-27 NOTE — Assessment & Plan Note (Signed)
With her increased pain and possible injury I think it prudent to have her come into the clinic to be evaluated after her recent fall. I discussed that she is nearly at maximal medical therapy currently and options are very limited as she is on several centrally acting medications and opioids and muscle relaxers are not indicated long term for her neuropathic type pain. She has follow-up planned with the pain clinic August 3rd but they will not prescribe her pain medication at her first visit, which she is very concerned about. She is agreeable to come in and be evaluated as she has increased pain and bruising from her recent fall.

## 2018-08-27 NOTE — Assessment & Plan Note (Deleted)
-   with her increased pain and possible injury I think it prudent to have her come into the clinic to be evaluated after her recent fall. I discussed that she is nearly at maximal medical therapy currently and options are very limited as she is on several centrally acting medications and opioids and muscle relaxers are not indicated long term for her neuropathic type pain. She has follow-up planned with the pain clinic August 3rd but they will not prescribe her pain medication at her first visit, which she is very concerned about but would like to come in to be evaluated to make sure she has no acute injury.

## 2018-08-28 NOTE — Progress Notes (Signed)
Internal Medicine Clinic Attending  Case discussed with Dr. Seawell at the time of the visit.  We reviewed the resident's history and exam and pertinent patient test results.  I agree with the assessment, diagnosis, and plan of care documented in the resident's note.    

## 2018-08-31 ENCOUNTER — Ambulatory Visit: Payer: Medicare Other | Attending: Internal Medicine | Admitting: Physical Therapy

## 2018-08-31 ENCOUNTER — Telehealth: Payer: Self-pay

## 2018-08-31 ENCOUNTER — Other Ambulatory Visit: Payer: Self-pay

## 2018-08-31 DIAGNOSIS — R293 Abnormal posture: Secondary | ICD-10-CM

## 2018-08-31 DIAGNOSIS — M545 Low back pain, unspecified: Secondary | ICD-10-CM

## 2018-08-31 DIAGNOSIS — G8929 Other chronic pain: Secondary | ICD-10-CM

## 2018-08-31 DIAGNOSIS — M6281 Muscle weakness (generalized): Secondary | ICD-10-CM

## 2018-08-31 DIAGNOSIS — R2681 Unsteadiness on feet: Secondary | ICD-10-CM | POA: Diagnosis not present

## 2018-08-31 DIAGNOSIS — R2689 Other abnormalities of gait and mobility: Secondary | ICD-10-CM

## 2018-08-31 DIAGNOSIS — M542 Cervicalgia: Secondary | ICD-10-CM

## 2018-08-31 NOTE — Patient Instructions (Signed)
Access Code: [email or web address]  URL: [email or web address]  Date: 08/31/2018  Prepared by: Elsie Ra   Exercises  Supine Hamstring Stretch with Strap - 2 reps - 1 sets - 30 hold - 2x daily - 6x weekly  Hooklying Single Knee to Chest Stretch - 2 reps - 1 sets - 30 hold - 2x daily - 6x weekly  Seated Lumbar Flexion Stretch - 10 reps - 1 sets - 5 hold - 2x daily - 6x weekly  Supine Lower Trunk Rotation - 10 reps - 1-2 sets - 5 hold - 2x daily - 6x weekly  Hooklying Lumbar Traction - 10 reps - 2 sets - 5 hold - 2x daily - 6x weekly

## 2018-08-31 NOTE — Telephone Encounter (Signed)
Referral to quitline

## 2018-08-31 NOTE — Therapy (Signed)
[facility] [address] [place], [place], [address] Phone: [phone]   Fax:  [phone]  Physical Therapy Treatment  Patient Details  Name: Lindsay [name] MRN: [id number] Date of Birth: [birth year] Referring Provider (PT): Gilles Chiquito, MD   Encounter Date: 08/31/2018  PT End of Session - 08/31/18 1101    Visit Number  3    Number of Visits  25    Date for PT Re-Evaluation  11/06/18    Authorization Type  [employer] Medicare & Medicaid    PT Start Time  0800    PT Stop Time  0845    PT Time Calculation (min)  45 min    Equipment Utilized During Treatment  Gait belt;Oxygen   arrived without her oxygen on & PT had her put on her oxygen   Activity Tolerance  Patient tolerated treatment well;Patient limited by fatigue;Patient limited by pain    Behavior During Therapy  [facility] for tasks assessed/performed       Past Medical History:  Diagnosis Date  . Acid reflux   . Anemia    Iron Def  . Anorexia   . Chronic kidney disease    Nephrotic syndrome  . Colon polyp 2009  . Depression with anxiety   . Edema leg   . Fibromyalgia   . Hemorrhoids   . Hidradenitis suppurativa   . Hypertension   . IBS (irritable bowel syndrome)   . Low back pain   . Migraines   . Morbidly obese ([place])   . Neuromuscular disorder (HCC)    fibromyalgia  . Neuropathy   . Panic attacks   . Polyp of vocal cord or larynx   . Tonsil pain     Past Surgical History:  Procedure Laterality Date  . AXILLARY HIDRADENITIS EXCISION    . COLONOSCOPY WITH PROPOFOL N/A 05/25/2015   Procedure: COLONOSCOPY WITH PROPOFOL;  Surgeon: Milus Banister, MD;  Location: [facility];  Service: Endoscopy;  Laterality: N/A;  . HEMORRHOID SURGERY     with Hidradenitis surgery   . INGUINAL HIDRADENITIS EXCISION    . TONSILLECTOMY  10/18/2010   by Dr. Wilburn Cornelia  . UPPER GASTROINTESTINAL ENDOSCOPY      There were no vitals filed for this visit.  Subjective  Assessment - 08/31/18 0810    Subjective  Relays her back and legs are really bothering her.    Pertinent History  Chronic Kidney disease, Fibromyalgia, HTN, IBS, LBP, neuropathy,    Patient Stated Goals  To improve walking, move legs better. tolerating pain.    Currently in Pain?  Yes    Pain Score  9     Pain Location  Back    Pain Orientation  Lower    Pain Descriptors / Indicators  Aching    Pain Type  Chronic pain    Pain Radiating Towards  both legs    Pain Onset  More than a month ago    Pain Frequency  Constant    Pain Onset  More than a month ago                       [facility] Adult PT Treatment/Exercise - 08/31/18 0001      Ambulation/Gait   Ambulation/Gait  Yes    Ambulation/Gait Assistance  5: Supervision    Ambulation Distance (Feet)  200 Feet   one lap with rollator and one lap without with gait training for safe rollator use of breaks and  using seat     Exercises   Exercises  Lumbar      Lumbar Exercises: Stretches   Passive Hamstring Stretch  Right;Left;2 reps;30 seconds    Passive Hamstring Stretch Limitations  supine with strap    Single Knee to Chest Stretch  Right;Left;2 reps;30 seconds    Single Knee to Chest Stretch Limitations  supine with strap    Lower Trunk Rotation Limitations  5 sec X 10 each side    Other Lumbar Stretch Exercise  seated flexion stretch 5 sec X 10      Lumbar Exercises: Aerobic   Nustep  L1 5 min with one rest break      Manual Therapy   Manual therapy comments  supine long axis distraction mobs Lt leg and Rt leg               PT Short Term Goals - 08/10/18 1834      PT SHORT TERM GOAL #1   Title  Patient verbalizes & demonstrates understanding of initial HEP. (All STGs Target Date: 09/09/2018)    Time  4    Period  Weeks    Status  New    Target Date  09/09/18      PT SHORT TERM GOAL #2   Title  Patient reports 25% improvement in cervical pain.    Time  4    Period  Weeks    Status  New     Target Date  09/09/18      PT SHORT TERM GOAL #3   Title  Patient reports 25% improvement in lumbar & buttocks / hip pain.    Time  4    Period  Weeks    Status  New    Target Date  09/09/18      PT SHORT TERM GOAL #4   Title  Berg Balance Test >/= 43/56    Time  4    Period  Weeks    Status  New    Target Date  09/09/18      PT SHORT TERM GOAL #5   Title  Patient ambulates with head turns to scan environment without change in pace or path.    Time  4    Period  Weeks    Status  New    Target Date  09/09/18      Additional Short Term Goals   Additional Short Term Goals  Yes      PT SHORT TERM GOAL #6   Title  Gait Velocity >2.5 ft/sec    Time  4    Period  Weeks    Status  New    Target Date  09/09/18        PT Long Term Goals - 08/10/18 1824      PT LONG TERM GOAL #1   Title  Patient verbalizes & demonstrates ongoing HEP / fitness plan and exercise program for [employer]. (All LTGs Target Date 11/06/2018)    Time  12    Period  Weeks    Status  New    Target Date  11/06/18      PT LONG TERM GOAL #2   Title  Patient reports cervical pain to </= 5/10 with activities.    Time  12    Period  Weeks    Status  New    Target Date  11/06/18      PT LONG TERM GOAL #3   Title  Patient reports lumbar &  buttock / hip pain </= 4/10 with standing & gait activities.    Time  12    Period  Weeks    Status  New    Target Date  11/06/18      PT LONG TERM GOAL #4   Title  Berg Balance Test >/= 47/56 to indicate lower fall risk.    Time  12    Period  Weeks    Status  New    Target Date  11/06/18      PT LONG TERM GOAL #5   Title  Functional Gait Assessment >/= 15/30 to indicate lower fall risk.    Time  12    Period  Weeks    Status  New    Target Date  11/06/18      Additional Long Term Goals   Additional Long Term Goals  Yes      PT LONG TERM GOAL #6   Title  Gait Velocity >2.62 ft/sec to indicate fuller community mobility.    Time  12    Period  Weeks     Status  New    Target Date  11/06/18            Plan - 08/31/18 1101    Clinical Impression Statement  Session focused on lumbar today as this was her biggest compliant. She was treated with stretching and manual therapy and her HEP was updated for lumbar stretching. After this she rode nu step for endurance followed by gait training and endurance with rollator. Her O2 levels did drop to 80% after one lap but then rebounded in 2 min to >90%.    Personal Factors and Comorbidities  Comorbidity 3+;Behavior Pattern;Fitness;Past/Current Experience;Social Background;Time since onset of injury/illness/exacerbation    Comorbidities  Chronic Kidney disease, Fibromyalgia, HTN, IBS, LBP, neuropathy,    Examination-Activity Limitations  Bend;Caring for Others;Carry;Lift;Locomotion Level;Reach Overhead;Squat;Stairs;Stand;Transfers    Examination-Participation Restrictions  Community Activity    Stability/Clinical Decision Making  Evolving/Moderate complexity    Rehab Potential  Good    PT Frequency  2x / week    PT Duration  12 weeks    PT Treatment/Interventions  ADLs/Self Care Home Management;Aquatic Therapy;Cryotherapy;Electrical Stimulation;Moist Heat;Traction;Ultrasound;DME Instruction;Gait training;Stair training;Functional mobility training;Therapeutic activities;Therapeutic exercise;Balance training;Neuromuscular re-education;Patient/family education;Manual techniques;Passive range of motion;Dry needling;Vestibular;Spinal Manipulations;Joint Manipulations    PT Next Visit Plan  check & add to HEP for spinal flexibility & core stabilization, gait with rollator walker including ramps & curbs,  manual therapy for pain management    PT Home Exercise Plan  Access Code: [email or web address]    Consulted and Agree with Plan of Care  Patient       Patient will benefit from skilled therapeutic intervention in order to improve the following deficits and impairments:  Abnormal gait, Decreased activity tolerance,  Cardiopulmonary status limiting activity, Decreased balance, Decreased endurance, Decreased knowledge of use of DME, Decreased mobility, Decreased range of motion, Decreased strength, Dizziness, Increased edema, Impaired flexibility, Postural dysfunction, Obesity, Pain  Visit Diagnosis: 1. Muscle weakness (generalized)   2. Cervicalgia   3. Unsteadiness on feet   4. Other abnormalities of gait and mobility   5. Chronic midline low back pain without sciatica   6. Abnormal posture        Problem List Patient Active Problem List   Diagnosis Date Noted  . Fall 06/30/2018  . Frequent urination at night 04/23/2018  . Chronic cough 03/04/2018  . Dental infection 12/05/2017  . Right arm pain 12/05/2017  . Chronic  respiratory failure with hypoxia ([place])   . Low serum vitamin B12 05/02/2017  . Palpitations 04/11/2017  . Nutritional anemia 06/17/2016  . Allergic rhinitis 06/12/2016  . Vitamin D deficiency, unspecified 06/12/2016  . Nicotine dependence 06/12/2016  . Menorrhagia 01/08/2016  . Migraines 01/08/2016  . Bilateral leg pain 11/29/2015  . Hemorrhoids 07/05/2015  . Fibromyalgia 07/05/2015  . Chronic leg pain 09/26/2014  . Healthcare maintenance 09/26/2014  . Morbid obesity ([place]) 07/04/2014  . Major depressive disorder, recurrent episode, moderate ([place]) 10/13/2012  . Generalized anxiety disorder 10/13/2012  . Chronic nausea 05/10/2010  . IBS (irritable bowel syndrome) 06/16/2007  . Essential hypertension 02/06/2006  . HIDRADENITIS 11/18/2005    Silvestre Mesi 08/31/2018, 11:09 AM  [facility] [address] [place], [place], [address] Phone: [phone]   Fax:  [phone]  Name: Leen Tworek MRN: [id number] Date of Birth: 1970/10/20

## 2018-09-01 ENCOUNTER — Other Ambulatory Visit: Payer: Self-pay | Admitting: *Deleted

## 2018-09-01 ENCOUNTER — Other Ambulatory Visit: Payer: Self-pay

## 2018-09-01 DIAGNOSIS — M797 Fibromyalgia: Secondary | ICD-10-CM

## 2018-09-01 DIAGNOSIS — M79604 Pain in right leg: Secondary | ICD-10-CM

## 2018-09-01 DIAGNOSIS — M79605 Pain in left leg: Secondary | ICD-10-CM

## 2018-09-01 DIAGNOSIS — G629 Polyneuropathy, unspecified: Secondary | ICD-10-CM

## 2018-09-01 NOTE — Telephone Encounter (Signed)
Requesting to speak with a nurse about   gabapentin (NEURONTIN) 100 MG capsule   Please call pt back.

## 2018-09-01 NOTE — Telephone Encounter (Signed)
Will discuss the gabapentin with Dr. Dareen Piano, but her pregabalin should be sent to her PCP for refill.

## 2018-09-01 NOTE — Telephone Encounter (Signed)
I do not believe that patient should be on both gabapentin and lyrica. I will forward this to Dr. Sharon Seller who last saw the patient and started her on gabapentin for further review

## 2018-09-02 ENCOUNTER — Ambulatory Visit: Payer: Medicare Other | Admitting: Physical Therapy

## 2018-09-02 ENCOUNTER — Other Ambulatory Visit: Payer: Self-pay

## 2018-09-02 ENCOUNTER — Other Ambulatory Visit: Payer: Self-pay | Admitting: *Deleted

## 2018-09-02 DIAGNOSIS — M79605 Pain in left leg: Secondary | ICD-10-CM

## 2018-09-02 DIAGNOSIS — R2681 Unsteadiness on feet: Secondary | ICD-10-CM

## 2018-09-02 DIAGNOSIS — R2689 Other abnormalities of gait and mobility: Secondary | ICD-10-CM | POA: Diagnosis not present

## 2018-09-02 DIAGNOSIS — R293 Abnormal posture: Secondary | ICD-10-CM

## 2018-09-02 DIAGNOSIS — M79604 Pain in right leg: Secondary | ICD-10-CM

## 2018-09-02 DIAGNOSIS — G629 Polyneuropathy, unspecified: Secondary | ICD-10-CM

## 2018-09-02 DIAGNOSIS — G8929 Other chronic pain: Secondary | ICD-10-CM | POA: Diagnosis not present

## 2018-09-02 DIAGNOSIS — M6281 Muscle weakness (generalized): Secondary | ICD-10-CM | POA: Diagnosis not present

## 2018-09-02 DIAGNOSIS — M542 Cervicalgia: Secondary | ICD-10-CM | POA: Diagnosis not present

## 2018-09-02 DIAGNOSIS — M545 Low back pain: Secondary | ICD-10-CM | POA: Diagnosis not present

## 2018-09-02 NOTE — Therapy (Signed)
[facility] [address] [place], [place], [address] Phone: [phone]   Fax:  [phone]  Physical Therapy Treatment  Patient Details  Name: Lindsay [name] MRN: [id number] Date of Birth: March 27, 1970 Referring Provider (PT): Gilles Chiquito, MD   Encounter Date: 09/02/2018  PT End of Session - 09/02/18 0846    Visit Number  4    Number of Visits  25    Date for PT Re-Evaluation  11/06/18    Authorization Type  [employer]HC Medicare & Medicaid    PT Start Time  0805    PT Stop Time  0835    PT Time Calculation (min)  30 min    Equipment Utilized During Treatment  Gait belt;Oxygen   arrived without her oxygen on & PT had her put on her oxygen   Activity Tolerance  Patient tolerated treatment well;Patient limited by fatigue;Patient limited by pain    Behavior During Therapy  [facility] for tasks assessed/performed       Past Medical History:  Diagnosis Date  . Acid reflux   . Anemia    Iron Def  . Anorexia   . Chronic kidney disease    Nephrotic syndrome  . Colon polyp 2009  . Depression with anxiety   . Edema leg   . Fibromyalgia   . Hemorrhoids   . Hidradenitis suppurativa   . Hypertension   . IBS (irritable bowel syndrome)   . Low back pain   . Migraines   . Morbidly obese ([place])   . Neuromuscular disorder (HCC)    fibromyalgia  . Neuropathy   . Panic attacks   . Polyp of vocal cord or larynx   . Tonsil pain     Past Surgical History:  Procedure Laterality Date  . AXILLARY HIDRADENITIS EXCISION    . COLONOSCOPY WITH PROPOFOL N/A 05/25/2015   Procedure: COLONOSCOPY WITH PROPOFOL;  Surgeon: Milus Banister, MD;  Location: [facility];  Service: Endoscopy;  Laterality: N/A;  . HEMORRHOID SURGERY     with Hidradenitis surgery   . INGUINAL HIDRADENITIS EXCISION    . TONSILLECTOMY  10/18/2010   by Dr. Wilburn Cornelia  . UPPER GASTROINTESTINAL ENDOSCOPY      There were no vitals filed for this visit.  Subjective  Assessment - 09/02/18 0815    Subjective  Relays her Lt leg is really bothering her "15 out of 10, but not so severe she needs to go to hospital"    Pertinent History  Chronic Kidney disease, Fibromyalgia, HTN, IBS, LBP, neuropathy,    Patient Stated Goals  To improve walking, move legs better. tolerating pain.    Pain Onset  More than a month ago    Pain Onset  More than a month ago                       [facility] Adult PT Treatment/Exercise - 09/02/18 0001      Lumbar Exercises: Stretches   Passive Hamstring Stretch  Right;Left;2 reps;30 seconds    Passive Hamstring Stretch Limitations  manual    Single Knee to Chest Stretch  Right;Left    Single Knee to Chest Stretch Limitations  5 sec  X10 supine with strap    Lower Trunk Rotation Limitations  5 sec X 10 each side    Pelvic Tilt  20 reps    Other Lumbar Stretch Exercise  lumbar DKTC with feet on ball with PT overpressure 5 sec X 10  Lumbar Exercises: Supine   Clam Limitations  unilat X 10    Other Supine Lumbar Exercises  hip add ball sq 5 sec X 15             PT Education - 09/02/18 0816    Education Details  instructions to go to [facility] to pick up her rollator and they have prescription in epic.       PT Short Term Goals - 08/10/18 1834      PT SHORT TERM GOAL #1   Title  Patient verbalizes & demonstrates understanding of initial HEP. (All STGs Target Date: 09/09/2018)    Time  4    Period  Weeks    Status  New    Target Date  09/09/18      PT SHORT TERM GOAL #2   Title  Patient reports 25% improvement in cervical pain.    Time  4    Period  Weeks    Status  New    Target Date  09/09/18      PT SHORT TERM GOAL #3   Title  Patient reports 25% improvement in lumbar & buttocks / hip pain.    Time  4    Period  Weeks    Status  New    Target Date  09/09/18      PT SHORT TERM GOAL #4   Title  Berg Balance Test >/= 43/56    Time  4    Period  Weeks    Status  New    Target Date   09/09/18      PT SHORT TERM GOAL #5   Title  Patient ambulates with head turns to scan environment without change in pace or path.    Time  4    Period  Weeks    Status  New    Target Date  09/09/18      Additional Short Term Goals   Additional Short Term Goals  Yes      PT SHORT TERM GOAL #6   Title  Gait Velocity >2.5 ft/sec    Time  4    Period  Weeks    Status  New    Target Date  09/09/18        PT Long Term Goals - 08/10/18 1824      PT LONG TERM GOAL #1   Title  Patient verbalizes & demonstrates ongoing HEP / fitness plan and exercise program for [employer]. (All LTGs Target Date 11/06/2018)    Time  12    Period  Weeks    Status  New    Target Date  11/06/18      PT LONG TERM GOAL #2   Title  Patient reports cervical pain to </= 5/10 with activities.    Time  12    Period  Weeks    Status  New    Target Date  11/06/18      PT LONG TERM GOAL #3   Title  Patient reports lumbar & buttock / hip pain </= 4/10 with standing & gait activities.    Time  12    Period  Weeks    Status  New    Target Date  11/06/18      PT LONG TERM GOAL #4   Title  Berg Balance Test >/= 47/56 to indicate lower fall risk.    Time  12    Period  Weeks    Status  New    Target Date  11/06/18      PT LONG TERM GOAL #5   Title  Functional Gait Assessment >/= 15/30 to indicate lower fall risk.    Time  12    Period  Weeks    Status  New    Target Date  11/06/18      Additional Long Term Goals   Additional Long Term Goals  Yes      PT LONG TERM GOAL #6   Title  Gait Velocity >2.62 ft/sec to indicate fuller community mobility.    Time  12    Period  Weeks    Status  New    Target Date  11/06/18            Plan - 09/02/18 5621    Clinical Impression Statement  session again focused on Leg and lumbar stretching as this was her biggest complaint. She shows up with her O2 tank empty so some therex and all endurance activity was held today so she did not get SOB. She was  informed that her perscripton for rollator was in epic and that she can take it down to [facility] and pick it up.    Personal Factors and Comorbidities  Comorbidity 3+;Behavior Pattern;Fitness;Past/Current Experience;Social Background;Time since onset of injury/illness/exacerbation    Comorbidities  Chronic Kidney disease, Fibromyalgia, HTN, IBS, LBP, neuropathy,    Examination-Activity Limitations  Bend;Caring for Others;Carry;Lift;Locomotion Level;Reach Overhead;Squat;Stairs;Stand;Transfers    Examination-Participation Restrictions  Community Activity    Stability/Clinical Decision Making  Evolving/Moderate complexity    Rehab Potential  Good    PT Frequency  2x / week    PT Duration  12 weeks    PT Treatment/Interventions  ADLs/Self Care Home Management;Aquatic Therapy;Cryotherapy;Electrical Stimulation;Moist Heat;Traction;Ultrasound;DME Instruction;Gait training;Stair training;Functional mobility training;Therapeutic activities;Therapeutic exercise;Balance training;Neuromuscular re-education;Patient/family education;Manual techniques;Passive range of motion;Dry needling;Vestibular;Spinal Manipulations;Joint Manipulations    PT Next Visit Plan  check & add to HEP for spinal flexibility & core stabilization, gait with rollator walker including ramps & curbs,  manual therapy for pain management    PT Home Exercise Plan  Access Code: [email or web address]    Consulted and Agree with Plan of Care  Patient       Patient will benefit from skilled therapeutic intervention in order to improve the following deficits and impairments:  Abnormal gait, Decreased activity tolerance, Cardiopulmonary status limiting activity, Decreased balance, Decreased endurance, Decreased knowledge of use of DME, Decreased mobility, Decreased range of motion, Decreased strength, Dizziness, Increased edema, Impaired flexibility, Postural dysfunction, Obesity, Pain  Visit Diagnosis: 1. Muscle weakness (generalized)   2. Unsteadiness on  feet   3. Other abnormalities of gait and mobility   4. Chronic midline low back pain without sciatica   5. Abnormal posture        Problem List Patient Active Problem List   Diagnosis Date Noted  . Fall 06/30/2018  . Frequent urination at night 04/23/2018  . Chronic cough 03/04/2018  . Dental infection 12/05/2017  . Right arm pain 12/05/2017  . Chronic respiratory failure with hypoxia ([place])   . Low serum vitamin B12 05/02/2017  . Palpitations 04/11/2017  . Nutritional anemia 06/17/2016  . Allergic rhinitis 06/12/2016  . Vitamin D deficiency, unspecified 06/12/2016  . Nicotine dependence 06/12/2016  . Menorrhagia 01/08/2016  . Migraines 01/08/2016  . Bilateral leg pain 11/29/2015  . Hemorrhoids 07/05/2015  . Fibromyalgia 07/05/2015  . Chronic leg pain 09/26/2014  . Healthcare maintenance 09/26/2014  . Morbid obesity ([place]) 07/04/2014  .  Major depressive disorder, recurrent episode, moderate ([place]) 10/13/2012  . Generalized anxiety disorder 10/13/2012  . Chronic nausea 05/10/2010  . IBS (irritable bowel syndrome) 06/16/2007  . Essential hypertension 02/06/2006  . HIDRADENITIS 11/18/2005    Debbe Odea, PT,DPT 09/02/2018, 8:47 AM  [facility] [address] [place], [place], [address] Phone: [phone]   Fax:  [phone]  Name: Lindsay [name] MRN: [id number] Date of Birth: May 31, 1970

## 2018-09-02 NOTE — Telephone Encounter (Signed)
Sent new script to dr Dareen Piano

## 2018-09-03 ENCOUNTER — Ambulatory Visit (HOSPITAL_COMMUNITY): Payer: Medicare Other | Admitting: Licensed Clinical Social Worker

## 2018-09-03 ENCOUNTER — Other Ambulatory Visit: Payer: Self-pay | Admitting: Internal Medicine

## 2018-09-03 NOTE — Telephone Encounter (Signed)
Please send the refill to Dr. Sharon Seller. I think that after discussion with Dr. Maudie Mercury she was going to d/c this medication

## 2018-09-04 NOTE — Progress Notes (Signed)
Internal Medicine Clinic Attending  Case discussed with Dr. Seawell at the time of the visit.  We reviewed the resident's history and exam and pertinent patient test results.  I agree with the assessment, diagnosis, and plan of care documented in the resident's note.  Alexander Raines, M.D., Ph.D.  

## 2018-09-05 ENCOUNTER — Other Ambulatory Visit: Payer: Self-pay | Admitting: Pulmonary Disease

## 2018-09-07 ENCOUNTER — Ambulatory Visit: Payer: Medicare Other | Admitting: Physical Therapy

## 2018-09-07 MED ORDER — VARENICLINE TARTRATE 1 MG PO TABS: 1.0000 mg | ORAL_TABLET | Freq: Two times a day (BID) | ORAL | 0 refills | Status: DC

## 2018-09-08 ENCOUNTER — Other Ambulatory Visit: Payer: Self-pay | Admitting: Student in an Organized Health Care Education/Training Program

## 2018-09-08 DIAGNOSIS — M797 Fibromyalgia: Secondary | ICD-10-CM

## 2018-09-08 DIAGNOSIS — H40013 Open angle with borderline findings, low risk, bilateral: Secondary | ICD-10-CM | POA: Diagnosis not present

## 2018-09-08 LAB — HM DIABETES EYE EXAM

## 2018-09-08 NOTE — Telephone Encounter (Signed)
Filled 7/20 for 2 months. Right pharmacy. Nl method

## 2018-09-09 ENCOUNTER — Other Ambulatory Visit: Payer: Self-pay

## 2018-09-09 ENCOUNTER — Ambulatory Visit: Payer: Medicare Other | Admitting: Physical Therapy

## 2018-09-09 DIAGNOSIS — M6281 Muscle weakness (generalized): Secondary | ICD-10-CM

## 2018-09-09 DIAGNOSIS — R2681 Unsteadiness on feet: Secondary | ICD-10-CM

## 2018-09-09 DIAGNOSIS — R2689 Other abnormalities of gait and mobility: Secondary | ICD-10-CM | POA: Diagnosis not present

## 2018-09-09 DIAGNOSIS — M542 Cervicalgia: Secondary | ICD-10-CM | POA: Diagnosis not present

## 2018-09-09 DIAGNOSIS — R293 Abnormal posture: Secondary | ICD-10-CM | POA: Diagnosis not present

## 2018-09-09 DIAGNOSIS — H5213 Myopia, bilateral: Secondary | ICD-10-CM | POA: Diagnosis not present

## 2018-09-09 DIAGNOSIS — G8929 Other chronic pain: Secondary | ICD-10-CM | POA: Diagnosis not present

## 2018-09-09 DIAGNOSIS — M545 Low back pain: Secondary | ICD-10-CM | POA: Diagnosis not present

## 2018-09-09 NOTE — Therapy (Signed)
[facility] [address] [place], [place], [address] Phone: [phone]   Fax:  [phone]  Physical Therapy Treatment  Patient Details  Name: Lindsay [name] MRN: [id number] Date of Birth: May 06, 1970 Referring Provider (PT): Gilles Chiquito, MD   Encounter Date: 09/09/2018  PT End of Session - 09/09/18 0849    Visit Number  5    Number of Visits  25    Date for PT Re-Evaluation  11/06/18    Authorization Type  UHC Medicare & Medicaid    PT Start Time  0805    PT Stop Time  0845    PT Time Calculation (min)  40 min    Equipment Utilized During Treatment  Gait belt;Oxygen   arrived without her oxygen on & PT had her put on her oxygen   Activity Tolerance  Patient tolerated treatment well;Patient limited by fatigue;Patient limited by pain    Behavior During Therapy  [facility] for tasks assessed/performed       Past Medical History:  Diagnosis Date  . Acid reflux   . Anemia    Iron Def  . Anorexia   . Chronic kidney disease    Nephrotic syndrome  . Colon polyp 2009  . Depression with anxiety   . Edema leg   . Fibromyalgia   . Hemorrhoids   . Hidradenitis suppurativa   . Hypertension   . IBS (irritable bowel syndrome)   . Low back pain   . Migraines   . Morbidly obese ([place])   . Neuromuscular disorder (HCC)    fibromyalgia  . Neuropathy   . Panic attacks   . Polyp of vocal cord or larynx   . Tonsil pain     Past Surgical History:  Procedure Laterality Date  . AXILLARY HIDRADENITIS EXCISION    . COLONOSCOPY WITH PROPOFOL N/A 05/25/2015   Procedure: COLONOSCOPY WITH PROPOFOL;  Surgeon: Milus Banister, MD;  Location: [facility];  Service: Endoscopy;  Laterality: N/A;  . HEMORRHOID SURGERY     with Hidradenitis surgery   . INGUINAL HIDRADENITIS EXCISION    . TONSILLECTOMY  10/18/2010   by Dr. Wilburn Cornelia  . UPPER GASTROINTESTINAL ENDOSCOPY      There were no vitals filed for this visit.  Subjective  Assessment - 09/09/18 0826    Subjective  Relays her Lt leg pain is 9/10 and her back pain is 8/10.    Pertinent History  Chronic Kidney disease, Fibromyalgia, HTN, IBS, LBP, neuropathy,    Patient Stated Goals  To improve walking, move legs better. tolerating pain.    Pain Onset  More than a month ago    Pain Onset  More than a month ago                       [facility] Adult PT Treatment/Exercise - 09/09/18 0001      Ambulation/Gait   Gait Comments  175 ft no AD with supervision       High Level Balance   High Level Balance Comments  tandem balance 30 sec X 2 bilat, then balance on airex feet together 1 min progressed to head turns X 10      Lumbar Exercises: Stretches   Passive Hamstring Stretch  Right;Left;2 reps;30 seconds    Passive Hamstring Stretch Limitations  seated with foot on stool    Pelvic Tilt  20 reps    Pelvic Tilt Limitations  standing A-P and lateral  Lumbar Exercises: Aerobic   Nustep  L3 5 min UE/LE      Lumbar Exercises: Standing   Other Standing Lumbar Exercises  hip abd and flexion and  hamstring curl  x10 each with 2 lbs all with UE support.       Lumbar Exercises: Seated   Long Arc Quad on Chair  Both;2 sets;10 reps    LAQ on Chair Weights (lbs)  2 lbs    Other Seated Lumbar Exercises  add ball sq 3 sec  X15               PT Short Term Goals - 08/10/18 1834      PT SHORT TERM GOAL #1   Title  Patient verbalizes & demonstrates understanding of initial HEP. (All STGs Target Date: 09/09/2018)    Time  4    Period  Weeks    Status  New    Target Date  09/09/18      PT SHORT TERM GOAL #2   Title  Patient reports 25% improvement in cervical pain.    Time  4    Period  Weeks    Status  New    Target Date  09/09/18      PT SHORT TERM GOAL #3   Title  Patient reports 25% improvement in lumbar & buttocks / hip pain.    Time  4    Period  Weeks    Status  New    Target Date  09/09/18      PT SHORT TERM GOAL #4   Title   Berg Balance Test >/= 43/56    Time  4    Period  Weeks    Status  New    Target Date  09/09/18      PT SHORT TERM GOAL #5   Title  Patient ambulates with head turns to scan environment without change in pace or path.    Time  4    Period  Weeks    Status  New    Target Date  09/09/18      Additional Short Term Goals   Additional Short Term Goals  Yes      PT SHORT TERM GOAL #6   Title  Gait Velocity >2.5 ft/sec    Time  4    Period  Weeks    Status  New    Target Date  09/09/18        PT Long Term Goals - 08/10/18 1824      PT LONG TERM GOAL #1   Title  Patient verbalizes & demonstrates ongoing HEP / fitness plan and exercise program for [employer]. (All LTGs Target Date 11/06/2018)    Time  12    Period  Weeks    Status  New    Target Date  11/06/18      PT LONG TERM GOAL #2   Title  Patient reports cervical pain to </= 5/10 with activities.    Time  12    Period  Weeks    Status  New    Target Date  11/06/18      PT LONG TERM GOAL #3   Title  Patient reports lumbar & buttock / hip pain </= 4/10 with standing & gait activities.    Time  12    Period  Weeks    Status  New    Target Date  11/06/18      PT LONG TERM GOAL #  4   Title  [employer] >/= 47/56 to indicate lower fall risk.    Time  12    Period  Weeks    Status  New    Target Date  11/06/18      PT LONG TERM GOAL #5   Title  Functional Gait Assessment >/= 15/30 to indicate lower fall risk.    Time  12    Period  Weeks    Status  New    Target Date  11/06/18      Additional Long Term Goals   Additional Long Term Goals  Yes      PT LONG TERM GOAL #6   Title  Gait Velocity >2.62 ft/sec to indicate fuller community mobility.    Time  12    Period  Weeks    Status  New    Target Date  11/06/18            Plan - 09/09/18 0850    Clinical Impression Statement  She was able to progress her strength and balance and ambulation tolerance today without getting SOB but she was on 1 L O2. Pt  will progress as able.    Personal Factors and Comorbidities  Comorbidity 3+;Behavior Pattern;Fitness;Past/Current Experience;Social Background;Time since onset of injury/illness/exacerbation    Comorbidities  Chronic Kidney disease, Fibromyalgia, HTN, IBS, LBP, neuropathy,    Examination-Activity Limitations  Bend;Caring for Others;Carry;Lift;Locomotion Level;Reach Overhead;Squat;Stairs;Stand;Transfers    Examination-Participation Restrictions  Community Activity    Stability/Clinical Decision Making  Evolving/Moderate complexity    Rehab Potential  Good    PT Frequency  2x / week    PT Duration  12 weeks    PT Treatment/Interventions  ADLs/Self Care Home Management;Aquatic Therapy;Cryotherapy;Electrical Stimulation;Moist Heat;Traction;Ultrasound;DME Instruction;Gait training;Stair training;Functional mobility training;Therapeutic activities;Therapeutic exercise;Balance training;Neuromuscular re-education;Patient/family education;Manual techniques;Passive range of motion;Dry needling;Vestibular;Spinal Manipulations;Joint Manipulations    PT Next Visit Plan  check & add to HEP for spinal flexibility & core stabilization, gait with rollator walker including ramps & curbs,  manual therapy for pain management    PT Home Exercise Plan  Access Code: [email or web address]    Consulted and Agree with Plan of Care  Patient       Patient will benefit from skilled therapeutic intervention in order to improve the following deficits and impairments:  Abnormal gait, Decreased activity tolerance, Cardiopulmonary status limiting activity, Decreased balance, Decreased endurance, Decreased knowledge of use of DME, Decreased mobility, Decreased range of motion, Decreased strength, Dizziness, Increased edema, Impaired flexibility, Postural dysfunction, Obesity, Pain  Visit Diagnosis: 1. Muscle weakness (generalized)   2. Unsteadiness on feet   3. Other abnormalities of gait and mobility   4. Chronic midline low back pain  without sciatica   5. Abnormal posture   6. Cervicalgia        Problem List Patient Active Problem List   Diagnosis Date Noted  . Fall 06/30/2018  . Frequent urination at night 04/23/2018  . Chronic cough 03/04/2018  . Dental infection 12/05/2017  . Right arm pain 12/05/2017  . Chronic respiratory failure with hypoxia ([place])   . Low serum vitamin B12 05/02/2017  . Palpitations 04/11/2017  . Nutritional anemia 06/17/2016  . Allergic rhinitis 06/12/2016  . Vitamin D deficiency, unspecified 06/12/2016  . Nicotine dependence 06/12/2016  . Menorrhagia 01/08/2016  . Migraines 01/08/2016  . Bilateral leg pain 11/29/2015  . Hemorrhoids 07/05/2015  . Fibromyalgia 07/05/2015  . Chronic leg pain 09/26/2014  . Healthcare maintenance 09/26/2014  . Morbid obesity ([place]) 07/04/2014  .  Major depressive disorder, recurrent episode, moderate ([place]) 10/13/2012  . Generalized anxiety disorder 10/13/2012  . Chronic nausea 05/10/2010  . IBS (irritable bowel syndrome) 06/16/2007  . Essential hypertension 02/06/2006  . HIDRADENITIS 11/18/2005    Debbe Odea , PT,DPT 09/09/2018, 8:52 AM  [facility] [address] [place], [place], [address] Phone: [phone]   Fax:  [phone]  Name: Skarleth Delmonico MRN: [id number] Date of Birth: 07-29-1970

## 2018-09-10 ENCOUNTER — Other Ambulatory Visit: Payer: Self-pay | Admitting: Internal Medicine

## 2018-09-10 DIAGNOSIS — M4807 Spinal stenosis, lumbosacral region: Secondary | ICD-10-CM | POA: Diagnosis not present

## 2018-09-10 DIAGNOSIS — I10 Essential (primary) hypertension: Secondary | ICD-10-CM

## 2018-09-10 DIAGNOSIS — M47897 Other spondylosis, lumbosacral region: Secondary | ICD-10-CM | POA: Diagnosis not present

## 2018-09-10 DIAGNOSIS — M48062 Spinal stenosis, lumbar region with neurogenic claudication: Secondary | ICD-10-CM | POA: Diagnosis not present

## 2018-09-10 DIAGNOSIS — M940 Chondrocostal junction syndrome [Tietze]: Secondary | ICD-10-CM | POA: Diagnosis not present

## 2018-09-10 DIAGNOSIS — M792 Neuralgia and neuritis, unspecified: Secondary | ICD-10-CM | POA: Diagnosis not present

## 2018-09-10 DIAGNOSIS — G894 Chronic pain syndrome: Secondary | ICD-10-CM | POA: Diagnosis not present

## 2018-09-10 DIAGNOSIS — M542 Cervicalgia: Secondary | ICD-10-CM | POA: Diagnosis not present

## 2018-09-10 DIAGNOSIS — G8929 Other chronic pain: Secondary | ICD-10-CM | POA: Diagnosis not present

## 2018-09-10 DIAGNOSIS — M545 Low back pain: Secondary | ICD-10-CM | POA: Diagnosis not present

## 2018-09-10 NOTE — Telephone Encounter (Signed)
Last note addressing amlodipine from Dr. Sherry Ruffing 05/2018 states plan was to decrease amlodipine to 5 mg daily. Will edit dose and refill at 5 mg daily.

## 2018-09-14 ENCOUNTER — Other Ambulatory Visit: Payer: Self-pay

## 2018-09-14 ENCOUNTER — Telehealth (HOSPITAL_COMMUNITY): Payer: Self-pay

## 2018-09-14 ENCOUNTER — Ambulatory Visit: Payer: Medicare Other | Admitting: Physical Therapy

## 2018-09-14 DIAGNOSIS — R293 Abnormal posture: Secondary | ICD-10-CM

## 2018-09-14 DIAGNOSIS — R2681 Unsteadiness on feet: Secondary | ICD-10-CM

## 2018-09-14 DIAGNOSIS — G8929 Other chronic pain: Secondary | ICD-10-CM

## 2018-09-14 DIAGNOSIS — M542 Cervicalgia: Secondary | ICD-10-CM

## 2018-09-14 DIAGNOSIS — R2689 Other abnormalities of gait and mobility: Secondary | ICD-10-CM

## 2018-09-14 DIAGNOSIS — M6281 Muscle weakness (generalized): Secondary | ICD-10-CM

## 2018-09-14 DIAGNOSIS — M545 Low back pain: Secondary | ICD-10-CM | POA: Diagnosis not present

## 2018-09-14 NOTE — Therapy (Signed)
[facility] [address] [place], [place], [address] Phone: [phone]   Fax:  [phone]  Physical Therapy Treatment  Patient Details  Name: Lindsay [name] MRN: [id number] Date of Birth: 09-30-1970 Referring Provider (PT): Gilles Chiquito, MD   Encounter Date: 09/14/2018  PT End of Session - 09/14/18 0835    Visit Number  6    Number of Visits  25    Date for PT Re-Evaluation  11/06/18    Authorization Type  UHC Medicare & Medicaid    PT Start Time  0805    PT Stop Time  0845    PT Time Calculation (min)  40 min    Equipment Utilized During Treatment  Gait belt;Oxygen   arrived without her oxygen on & PT had her put on her oxygen   Activity Tolerance  Patient tolerated treatment well;Patient limited by pain    Behavior During Therapy  [facility] for tasks assessed/performed       Past Medical History:  Diagnosis Date  . Acid reflux   . Anemia    Iron Def  . Anorexia   . Chronic kidney disease    Nephrotic syndrome  . Colon polyp 2009  . Depression with anxiety   . Edema leg   . Fibromyalgia   . Hemorrhoids   . Hidradenitis suppurativa   . Hypertension   . IBS (irritable bowel syndrome)   . Low back pain   . Migraines   . Morbidly obese ([place])   . Neuromuscular disorder (HCC)    fibromyalgia  . Neuropathy   . Panic attacks   . Polyp of vocal cord or larynx   . Tonsil pain     Past Surgical History:  Procedure Laterality Date  . AXILLARY HIDRADENITIS EXCISION    . COLONOSCOPY WITH PROPOFOL N/A 05/25/2015   Procedure: COLONOSCOPY WITH PROPOFOL;  Surgeon: Milus Banister, MD;  Location: [facility];  Service: Endoscopy;  Laterality: N/A;  . HEMORRHOID SURGERY     with Hidradenitis surgery   . INGUINAL HIDRADENITIS EXCISION    . TONSILLECTOMY  10/18/2010   by Dr. Wilburn Cornelia  . UPPER GASTROINTESTINAL ENDOSCOPY      There were no vitals filed for this visit.  Subjective Assessment - 09/14/18 0820    Subjective  Relays still constant Lt leg pain that is 9/10. She says that the medical supply store does not have her prescribed rollator in stock and she is waiting on this. She at least says she has overall less neck pain and she can turn it more                       [facility] Adult PT Treatment/Exercise - 09/14/18 0001      Ambulation/Gait   Gait Comments  220 ft no AD with supervision but PT did need to carry her O2 tank for her       High Level Balance   High Level Balance Comments  tandem balance, sidestepping, and retrowalking      Lumbar Exercises: Stretches   Passive Hamstring Stretch  Right;Left;2 reps;30 seconds    Passive Hamstring Stretch Limitations  seated with foot on stool    Lower Trunk Rotation Limitations  10 reps hold 3 sec, supine laying on wedge    Pelvic Tilt  20 reps    Pelvic Tilt Limitations  supine laying on wedge    Piriformis Stretch  Right;Left;2 reps;30 seconds    Piriformis Stretch  Limitations  supine laying on wegde      Lumbar Exercises: Aerobic   Nustep  L1 7 min      Lumbar Exercises: Standing   Other Standing Lumbar Exercises  heel toe raises X 15 ea      Lumbar Exercises: Seated   Long Arc Quad on Chair  Both;15 reps    LAQ on Chair Weights (lbs)  3 lbs               PT Short Term Goals - 08/10/18 1834      PT SHORT TERM GOAL #1   Title  Patient verbalizes & demonstrates understanding of initial HEP. (All STGs Target Date: 09/09/2018)    Time  4    Period  Weeks    Status  New    Target Date  09/09/18      PT SHORT TERM GOAL #2   Title  Patient reports 25% improvement in cervical pain.    Time  4    Period  Weeks    Status  New    Target Date  09/09/18      PT SHORT TERM GOAL #3   Title  Patient reports 25% improvement in lumbar & buttocks / hip pain.    Time  4    Period  Weeks    Status  New    Target Date  09/09/18      PT SHORT TERM GOAL #4   Title  Berg Balance Test >/= 43/56    Time  4    Period   Weeks    Status  New    Target Date  09/09/18      PT SHORT TERM GOAL #5   Title  Patient ambulates with head turns to scan environment without change in pace or path.    Time  4    Period  Weeks    Status  New    Target Date  09/09/18      Additional Short Term Goals   Additional Short Term Goals  Yes      PT SHORT TERM GOAL #6   Title  Gait Velocity >2.5 ft/sec    Time  4    Period  Weeks    Status  New    Target Date  09/09/18        PT Long Term Goals - 08/10/18 1824      PT LONG TERM GOAL #1   Title  Patient verbalizes & demonstrates ongoing HEP / fitness plan and exercise program for [employer]. (All LTGs Target Date 11/06/2018)    Time  12    Period  Weeks    Status  New    Target Date  11/06/18      PT LONG TERM GOAL #2   Title  Patient reports cervical pain to </= 5/10 with activities.    Time  12    Period  Weeks    Status  New    Target Date  11/06/18      PT LONG TERM GOAL #3   Title  Patient reports lumbar & buttock / hip pain </= 4/10 with standing & gait activities.    Time  12    Period  Weeks    Status  New    Target Date  11/06/18      PT LONG TERM GOAL #4   Title  Berg Balance Test >/= 47/56 to indicate lower fall risk.    Time  12    Period  Weeks    Status  New    Target Date  11/06/18      PT LONG TERM GOAL #5   Title  Functional Gait Assessment >/= 15/30 to indicate lower fall risk.    Time  12    Period  Weeks    Status  New    Target Date  11/06/18      Additional Long Term Goals   Additional Long Term Goals  Yes      PT LONG TERM GOAL #6   Title  Gait Velocity >2.62 ft/sec to indicate fuller community mobility.    Time  12    Period  Weeks    Status  New    Target Date  11/06/18            Plan - 09/14/18 0836    Clinical Impression Statement  Her biggest complaint is back and Lt leg pain so session focused more on stretching for these along with continued progression of activity tolerance and balance. She was able to  ambulate further today before needing rest break.    Personal Factors and Comorbidities  Comorbidity 3+;Behavior Pattern;Fitness;Past/Current Experience;Social Background;Time since onset of injury/illness/exacerbation    Comorbidities  Chronic Kidney disease, Fibromyalgia, HTN, IBS, LBP, neuropathy,    Examination-Activity Limitations  Bend;Caring for Others;Carry;Lift;Locomotion Level;Reach Overhead;Squat;Stairs;Stand;Transfers    Examination-Participation Restrictions  Community Activity    Stability/Clinical Decision Making  Evolving/Moderate complexity    Rehab Potential  Good    PT Frequency  2x / week    PT Duration  12 weeks    PT Treatment/Interventions  ADLs/Self Care Home Management;Aquatic Therapy;Cryotherapy;Electrical Stimulation;Moist Heat;Traction;Ultrasound;DME Instruction;Gait training;Stair training;Functional mobility training;Therapeutic activities;Therapeutic exercise;Balance training;Neuromuscular re-education;Patient/family education;Manual techniques;Passive range of motion;Dry needling;Vestibular;Spinal Manipulations;Joint Manipulations    PT Next Visit Plan  check & add to HEP for spinal flexibility & core stabilization, gait with rollator walker including ramps & curbs,  manual therapy for pain management    PT Home Exercise Plan  Access Code: [email or web address]    Consulted and Agree with Plan of Care  Patient       Patient will benefit from skilled therapeutic intervention in order to improve the following deficits and impairments:  Abnormal gait, Decreased activity tolerance, Cardiopulmonary status limiting activity, Decreased balance, Decreased endurance, Decreased knowledge of use of DME, Decreased mobility, Decreased range of motion, Decreased strength, Dizziness, Increased edema, Impaired flexibility, Postural dysfunction, Obesity, Pain  Visit Diagnosis: 1. Muscle weakness (generalized)   2. Unsteadiness on feet   3. Other abnormalities of gait and mobility   4. Chronic  midline low back pain without sciatica   5. Cervicalgia   6. Abnormal posture        Problem List Patient Active Problem List   Diagnosis Date Noted  . Fall 06/30/2018  . Frequent urination at night 04/23/2018  . Chronic cough 03/04/2018  . Dental infection 12/05/2017  . Right arm pain 12/05/2017  . Chronic respiratory failure with hypoxia ([place])   . Low serum vitamin B12 05/02/2017  . Palpitations 04/11/2017  . Nutritional anemia 06/17/2016  . Allergic rhinitis 06/12/2016  . Vitamin D deficiency, unspecified 06/12/2016  . Nicotine dependence 06/12/2016  . Menorrhagia 01/08/2016  . Migraines 01/08/2016  . Bilateral leg pain 11/29/2015  . Hemorrhoids 07/05/2015  . Fibromyalgia 07/05/2015  . Chronic leg pain 09/26/2014  . Healthcare maintenance 09/26/2014  . Morbid obesity ([place]) 07/04/2014  . Major depressive disorder, recurrent episode, moderate ([place]) 10/13/2012  .  Generalized anxiety disorder 10/13/2012  . Chronic nausea 05/10/2010  . IBS (irritable bowel syndrome) 06/16/2007  . Essential hypertension 02/06/2006  . HIDRADENITIS 11/18/2005    Lindsay [name] 09/14/2018, 8:46 AM  [facility] [address] [place], [place], [address] Phone: [phone]   Fax:  [phone]  Name: Lindsay [name] MRN: [id number] Date of Birth: 17-Jan-1971

## 2018-09-14 NOTE — Telephone Encounter (Signed)
She can have a letter stating that she is taking Tranxene for her generalized anxiety disorder.

## 2018-09-14 NOTE — Telephone Encounter (Signed)
Patient called, she needs a letter of medical necessity for her new pain management doctor Dr. Mohammed Kindle. She needs the letter because she is taking Tranxene 15 mg at bedtime and it should state that you prescribe for anxiety and sleep. Please review and advise, thank you

## 2018-09-16 ENCOUNTER — Ambulatory Visit: Payer: Medicare Other | Admitting: Physical Therapy

## 2018-09-16 ENCOUNTER — Other Ambulatory Visit: Payer: Self-pay

## 2018-09-16 ENCOUNTER — Telehealth: Payer: Self-pay | Admitting: *Deleted

## 2018-09-16 ENCOUNTER — Telehealth: Payer: Self-pay

## 2018-09-16 DIAGNOSIS — M542 Cervicalgia: Secondary | ICD-10-CM | POA: Diagnosis not present

## 2018-09-16 DIAGNOSIS — M545 Low back pain: Secondary | ICD-10-CM | POA: Diagnosis not present

## 2018-09-16 DIAGNOSIS — R293 Abnormal posture: Secondary | ICD-10-CM

## 2018-09-16 DIAGNOSIS — R2689 Other abnormalities of gait and mobility: Secondary | ICD-10-CM

## 2018-09-16 DIAGNOSIS — M6281 Muscle weakness (generalized): Secondary | ICD-10-CM

## 2018-09-16 DIAGNOSIS — G8929 Other chronic pain: Secondary | ICD-10-CM | POA: Diagnosis not present

## 2018-09-16 DIAGNOSIS — R2681 Unsteadiness on feet: Secondary | ICD-10-CM

## 2018-09-16 NOTE — Telephone Encounter (Signed)
thanks

## 2018-09-16 NOTE — Telephone Encounter (Signed)
I don't think I have ever met this patient and would not be able to write this letter. She was seen by Dr. Seawell on her last 2 visits and the attendings who supervised were Dr. Vincent and Dr. Raines. They would probably be able to help you more than me.   

## 2018-09-16 NOTE — Telephone Encounter (Signed)
Pt want to notified the doctor,  GYN office cancel her appt for 09/23/2018 due emergency in the office.

## 2018-09-16 NOTE — Therapy (Signed)
[facility] [address] [place], [place], [address] Phone: [phone]   Fax:  [phone]  Physical Therapy Treatment  Patient Details  Name: Lindsay [name] MRN: [id number] Date of Birth: 1970-04-25 Referring Provider (PT): Gilles Chiquito, MD   Encounter Date: 09/16/2018  PT End of Session - 09/16/18 0848    Visit Number  7    Number of Visits  25    Date for PT Re-Evaluation  11/06/18    Authorization Type  UHC Medicare & Medicaid    PT Start Time  0807    PT Stop Time  0847    PT Time Calculation (min)  40 min    Equipment Utilized During Treatment  Gait belt;Oxygen   arrived without her oxygen on & PT had her put on her oxygen   Activity Tolerance  Patient tolerated treatment well    Behavior During Therapy  [facility] for tasks assessed/performed       Past Medical History:  Diagnosis Date  . Acid reflux   . Anemia    Iron Def  . Anorexia   . Chronic kidney disease    Nephrotic syndrome  . Colon polyp 2009  . Depression with anxiety   . Edema leg   . Fibromyalgia   . Hemorrhoids   . Hidradenitis suppurativa   . Hypertension   . IBS (irritable bowel syndrome)   . Low back pain   . Migraines   . Morbidly obese ([place])   . Neuromuscular disorder (HCC)    fibromyalgia  . Neuropathy   . Panic attacks   . Polyp of vocal cord or larynx   . Tonsil pain     Past Surgical History:  Procedure Laterality Date  . AXILLARY HIDRADENITIS EXCISION    . COLONOSCOPY WITH PROPOFOL N/A 05/25/2015   Procedure: COLONOSCOPY WITH PROPOFOL;  Surgeon: Milus Banister, MD;  Location: [facility];  Service: Endoscopy;  Laterality: N/A;  . HEMORRHOID SURGERY     with Hidradenitis surgery   . INGUINAL HIDRADENITIS EXCISION    . TONSILLECTOMY  10/18/2010   by Dr. Wilburn Cornelia  . UPPER GASTROINTESTINAL ENDOSCOPY      There were no vitals filed for this visit.  Subjective Assessment - 09/16/18 0841    Subjective  Primary  complaint today is Rt heel pain, "it may be after doing the heel toe raises last time".    Pertinent History  Chronic Kidney disease, Fibromyalgia, HTN, IBS, LBP, neuropathy,    Patient Stated Goals  To improve walking, move legs better. tolerating pain.    Pain Onset  More than a month ago    Pain Onset  More than a month ago                       [facility] Adult PT Treatment/Exercise - 09/16/18 0001      Ambulation/Gait   Gait Comments  330 ft and carrying her own O2 tank this time with supervision and 2L      Lumbar Exercises: Stretches   Passive Hamstring Stretch  Right;Left;2 reps;30 seconds    Passive Hamstring Stretch Limitations  seated with foot on stool    Lower Trunk Rotation Limitations  10 reps hold 3 sec, supine laying on wedge    Piriformis Stretch  Right;Left;2 reps;30 seconds    Piriformis Stretch Limitations  supine laying on wegde    Gastroc Stretch Limitations  Rt leg 2 times seated with strap, and 2  times standing using wall       Lumbar Exercises: Aerobic   Nustep  Sci fit 6 min      Lumbar Exercises: Seated   Sit to Stand Limitations  2X5, had normal O2 levels after first set but then after 2nd set dropped below 85 so advised to put on her O2               PT Short Term Goals - 08/10/18 1834      PT SHORT TERM GOAL #1   Title  Patient verbalizes & demonstrates understanding of initial HEP. (All STGs Target Date: 09/09/2018)    Time  4    Period  Weeks    Status  New    Target Date  09/09/18      PT SHORT TERM GOAL #2   Title  Patient reports 25% improvement in cervical pain.    Time  4    Period  Weeks    Status  New    Target Date  09/09/18      PT SHORT TERM GOAL #3   Title  Patient reports 25% improvement in lumbar & buttocks / hip pain.    Time  4    Period  Weeks    Status  New    Target Date  09/09/18      PT SHORT TERM GOAL #4   Title  Berg Balance Test >/= 43/56    Time  4    Period  Weeks    Status  New     Target Date  09/09/18      PT SHORT TERM GOAL #5   Title  Patient ambulates with head turns to scan environment without change in pace or path.    Time  4    Period  Weeks    Status  New    Target Date  09/09/18      Additional Short Term Goals   Additional Short Term Goals  Yes      PT SHORT TERM GOAL #6   Title  Gait Velocity >2.5 ft/sec    Time  4    Period  Weeks    Status  New    Target Date  09/09/18        PT Long Term Goals - 08/10/18 1824      PT LONG TERM GOAL #1   Title  Patient verbalizes & demonstrates ongoing HEP / fitness plan and exercise program for [employer]. (All LTGs Target Date 11/06/2018)    Time  12    Period  Weeks    Status  New    Target Date  11/06/18      PT LONG TERM GOAL #2   Title  Patient reports cervical pain to </= 5/10 with activities.    Time  12    Period  Weeks    Status  New    Target Date  11/06/18      PT LONG TERM GOAL #3   Title  Patient reports lumbar & buttock / hip pain </= 4/10 with standing & gait activities.    Time  12    Period  Weeks    Status  New    Target Date  11/06/18      PT LONG TERM GOAL #4   Title  Berg Balance Test >/= 47/56 to indicate lower fall risk.    Time  12    Period  Weeks    Status  New    Target Date  11/06/18      PT LONG TERM GOAL #5   Title  Functional Gait Assessment >/= 15/30 to indicate lower fall risk.    Time  12    Period  Weeks    Status  New    Target Date  11/06/18      Additional Long Term Goals   Additional Long Term Goals  Yes      PT LONG TERM GOAL #6   Title  Gait Velocity >2.62 ft/sec to indicate fuller community mobility.    Time  12    Period  Weeks    Status  New    Target Date  11/06/18            Plan - 09/16/18 0844    Clinical Impression Statement  Lt leg did not appear to be bothering her today instead it was Rt heel pain so she was shown calf stretching and discontinued Heel toe raises. She trialed first half of session without supplemental O2 and  had good tolerance with sci fit for endurance, and stretching, but when she performed her 2nd set of sit to stands her O2 levels dropped below 85% so advised to put on her supplemental O2, she was then able to ambulate further today (330 ft) with normal O2 levels on 2L. She was advised so set up more PT appointments to continue to work on endurance, strength, and function.    Personal Factors and Comorbidities  Comorbidity 3+;Behavior Pattern;Fitness;Past/Current Experience;Social Background;Time since onset of injury/illness/exacerbation    Comorbidities  Chronic Kidney disease, Fibromyalgia, HTN, IBS, LBP, neuropathy,    Examination-Activity Limitations  Bend;Caring for Others;Carry;Lift;Locomotion Level;Reach Overhead;Squat;Stairs;Stand;Transfers    Examination-Participation Restrictions  Community Activity    Stability/Clinical Decision Making  Evolving/Moderate complexity    Rehab Potential  Good    PT Frequency  2x / week    PT Duration  12 weeks    PT Treatment/Interventions  ADLs/Self Care Home Management;Aquatic Therapy;Cryotherapy;Electrical Stimulation;Moist Heat;Traction;Ultrasound;DME Instruction;Gait training;Stair training;Functional mobility training;Therapeutic activities;Therapeutic exercise;Balance training;Neuromuscular re-education;Patient/family education;Manual techniques;Passive range of motion;Dry needling;Vestibular;Spinal Manipulations;Joint Manipulations    PT Next Visit Plan  check & add to HEP for spinal flexibility & core stabilization, gait with rollator walker including ramps & curbs,  manual therapy for pain management    PT Home Exercise Plan  Access Code: [email or web address]    Consulted and Agree with Plan of Care  Patient       Patient will benefit from skilled therapeutic intervention in order to improve the following deficits and impairments:  Abnormal gait, Decreased activity tolerance, Cardiopulmonary status limiting activity, Decreased balance, Decreased endurance,  Decreased knowledge of use of DME, Decreased mobility, Decreased range of motion, Decreased strength, Dizziness, Increased edema, Impaired flexibility, Postural dysfunction, Obesity, Pain  Visit Diagnosis: 1. Muscle weakness (generalized)   2. Unsteadiness on feet   3. Other abnormalities of gait and mobility   4. Chronic midline low back pain without sciatica   5. Abnormal posture        Problem List Patient Active Problem List   Diagnosis Date Noted  . Fall 06/30/2018  . Frequent urination at night 04/23/2018  . Chronic cough 03/04/2018  . Dental infection 12/05/2017  . Right arm pain 12/05/2017  . Chronic respiratory failure with hypoxia ([place])   . Low serum vitamin B12 05/02/2017  . Palpitations 04/11/2017  . Nutritional anemia 06/17/2016  . Allergic rhinitis 06/12/2016  . Vitamin D deficiency, unspecified 06/12/2016  .  Nicotine dependence 06/12/2016  . Menorrhagia 01/08/2016  . Migraines 01/08/2016  . Bilateral leg pain 11/29/2015  . Hemorrhoids 07/05/2015  . Fibromyalgia 07/05/2015  . Chronic leg pain 09/26/2014  . Healthcare maintenance 09/26/2014  . Morbid obesity ([place]) 07/04/2014  . Major depressive disorder, recurrent episode, moderate ([place]) 10/13/2012  . Generalized anxiety disorder 10/13/2012  . Chronic nausea 05/10/2010  . IBS (irritable bowel syndrome) 06/16/2007  . Essential hypertension 02/06/2006  . HIDRADENITIS 11/18/2005    Silvestre Mesi 09/16/2018, 8:48 AM  [facility] [address] [place], [place], [address] Phone: [phone]   Fax:  [phone]  Name: Lindsay [name] MRN: [id number] Date of Birth: Apr 30, 1970

## 2018-09-16 NOTE — Telephone Encounter (Signed)
I think she will need to make an appointment with her PCP or maybe a telehealth visit. We did not discuss her vision during her acute visit.

## 2018-09-16 NOTE — Telephone Encounter (Signed)
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

## 2018-09-17 ENCOUNTER — Encounter: Payer: Self-pay | Admitting: Internal Medicine

## 2018-09-17 ENCOUNTER — Other Ambulatory Visit: Payer: Self-pay | Admitting: Internal Medicine

## 2018-09-17 ENCOUNTER — Ambulatory Visit (INDEPENDENT_AMBULATORY_CARE_PROVIDER_SITE_OTHER): Payer: Medicare Other | Admitting: Internal Medicine

## 2018-09-17 DIAGNOSIS — Z9981 Dependence on supplemental oxygen: Secondary | ICD-10-CM | POA: Diagnosis not present

## 2018-09-17 DIAGNOSIS — H538 Other visual disturbances: Secondary | ICD-10-CM | POA: Insufficient documentation

## 2018-09-17 DIAGNOSIS — J9611 Chronic respiratory failure with hypoxia: Secondary | ICD-10-CM | POA: Diagnosis not present

## 2018-09-17 NOTE — Progress Notes (Signed)
  [facility] Internal Medicine Residency Telephone Encounter Continuity Care Appointment  HPI:   This telephone encounter was created for Lindsay [name] on 09/17/2018 for the following purpose/cc: Requesting a letter to receive escort to her clinic appointment.  [name] is a 48 year old woman with medical history of chronic hypoxic respiratory failure on 4 L O2 at home and vision abnormalities currently being worked up by ophthalmology who called to the clinic requesting a letter to have an escort to her clinic appointments.   Past Medical History:  Past Medical History:  Diagnosis Date  . Acid reflux   . Anemia    Iron Def  . Anorexia   . Chronic kidney disease    Nephrotic syndrome  . Colon polyp 2009  . Depression with anxiety   . Edema leg   . Fibromyalgia   . Hemorrhoids   . Hidradenitis suppurativa   . Hypertension   . IBS (irritable bowel syndrome)   . Low back pain   . Migraines   . Morbidly obese ([place])   . Neuromuscular disorder (HCC)    fibromyalgia  . Neuropathy   . Panic attacks   . Polyp of vocal cord or larynx   . Tonsil pain       ROS:  Review of Systems  Eyes: Positive for blurred vision. Negative for redness.     Assessment / Plan / Recommendations:   Please see A&P under problem oriented charting for assessment of the patient's acute and chronic medical conditions.   As always, pt is advised that if symptoms worsen or new symptoms arise, they should go to an urgent care facility or to to ER for further evaluation.   Consent and Medical Decision Making:   Patient discussed with Dr. Evette Doffing  This is a telephone encounter between Druscilla [name] and Lindsay [name] on 09/17/2018 for patient requesting letter for escort services. The visit was conducted with the patient located at home and Lindsay [name] at [facility]. The patient's identity was confirmed using their DOB and current address. The patient has consented to being evaluated through a telephone  encounter and understands the associated risks (an examination cannot be done and the patient may need to come in for an appointment) / benefits (allows the patient to remain at home, decreasing exposure to coronavirus). I personally spent 6 minutes on medical discussion.

## 2018-09-17 NOTE — Telephone Encounter (Signed)
Hi,   I called Ms. Hodgman but was unable to reach her so I contacted the doctor at [facility]. She saw Dr. Wynetta Emery on 8/11 and was referred to Dr. Franklyn Lor for glaucoma evaluation and Dr. Anibal Henderson for "possible hole in retina." I will write the letter stating she requires an escort and an official note once I am able to reach her.   Thanks

## 2018-09-17 NOTE — Progress Notes (Signed)
Internal Medicine Clinic Attending  Case discussed with Dr. Agyei at the time of the visit.  We reviewed the resident's history and exam and pertinent patient test results.  I agree with the assessment, diagnosis, and plan of care documented in the resident's note.    

## 2018-09-17 NOTE — Assessment & Plan Note (Signed)
Lindsay [name] reports that she has been experiencing blurry vision and a sensation of "tear in eye" and also "feeling like something is on her eye."She has been evaluated at Dr. Wynetta Emery at [facility] on September 08, 2018 who referred her to see Dr. Carolynn Sayers for glaucoma evaluation.  She is also to see Dr. Anibal Henderson on September 14 to evaluate for possible hole in her right retina.  His phone number is [phone].  I have provided her a letter in my chart and have also printed out a physical copy to provide to the agency that we will evaluate her for escort services to her clinic appointments.

## 2018-09-17 NOTE — Telephone Encounter (Signed)
Called pt - stated she needs a letter stating she needs an escort to go with her to her appts since she using a rolator now and went to [facility] on the 11th, she has right "macular hole" and has to carry oxygen. She agreed to a telehealth visit ; scheduled for today, pt informed to be available between 3 -4 PM.

## 2018-09-18 NOTE — Telephone Encounter (Signed)
Called pt to let her know letter is ready; she asked to fax it to transportation [phone]). But incorrect #.  I called [employer] for correct fax# [phone]). Fax was received per Pamala Hurry.

## 2018-09-21 ENCOUNTER — Other Ambulatory Visit: Payer: Self-pay

## 2018-09-21 ENCOUNTER — Ambulatory Visit: Payer: Medicare Other | Admitting: Physical Therapy

## 2018-09-21 DIAGNOSIS — R2681 Unsteadiness on feet: Secondary | ICD-10-CM | POA: Diagnosis not present

## 2018-09-21 DIAGNOSIS — M545 Low back pain: Secondary | ICD-10-CM | POA: Diagnosis not present

## 2018-09-21 DIAGNOSIS — M6281 Muscle weakness (generalized): Secondary | ICD-10-CM

## 2018-09-21 DIAGNOSIS — G8929 Other chronic pain: Secondary | ICD-10-CM | POA: Diagnosis not present

## 2018-09-21 DIAGNOSIS — M542 Cervicalgia: Secondary | ICD-10-CM

## 2018-09-21 DIAGNOSIS — R293 Abnormal posture: Secondary | ICD-10-CM

## 2018-09-21 DIAGNOSIS — R2689 Other abnormalities of gait and mobility: Secondary | ICD-10-CM | POA: Diagnosis not present

## 2018-09-21 NOTE — Therapy (Signed)
[facility] [address] [place], [place], [address] Phone: [phone]   Fax:  [phone]  Physical Therapy Treatment  Patient Details  Name: Lindsay [name] MRN: [id number] Date of Birth: Mar 15, 1970 Referring Provider (PT): Gilles Chiquito, MD   Encounter Date: 09/21/2018  PT End of Session - 09/21/18 0841    Visit Number  8    Number of Visits  25    Date for PT Re-Evaluation  11/06/18    Authorization Type  [employer] Medicare & Medicaid    PT Start Time  0800    PT Stop Time  0838    PT Time Calculation (min)  38 min    Equipment Utilized During Treatment  --   arrived without her oxygen on & PT had her put on her oxygen   Activity Tolerance  Patient tolerated treatment well    Behavior During Therapy  [facility] for tasks assessed/performed       Past Medical History:  Diagnosis Date  . Acid reflux   . Anemia    Iron Def  . Anorexia   . Chronic kidney disease    Nephrotic syndrome  . Colon polyp 2009  . Depression with anxiety   . Edema leg   . Fibromyalgia   . Hemorrhoids   . Hidradenitis suppurativa   . Hypertension   . IBS (irritable bowel syndrome)   . Low back pain   . Migraines   . Morbidly obese ([place])   . Neuromuscular disorder (HCC)    fibromyalgia  . Neuropathy   . Panic attacks   . Polyp of vocal cord or larynx   . Tonsil pain     Past Surgical History:  Procedure Laterality Date  . AXILLARY HIDRADENITIS EXCISION    . COLONOSCOPY WITH PROPOFOL N/A 05/25/2015   Procedure: COLONOSCOPY WITH PROPOFOL;  Surgeon: Milus Banister, MD;  Location: [facility];  Service: Endoscopy;  Laterality: N/A;  . HEMORRHOID SURGERY     with Hidradenitis surgery   . INGUINAL HIDRADENITIS EXCISION    . TONSILLECTOMY  10/18/2010   by Dr. Wilburn Cornelia  . UPPER GASTROINTESTINAL ENDOSCOPY      There were no vitals filed for this visit.  Subjective Assessment - 09/21/18 0810    Subjective  Relays she tossed and turned  all night so did not sleep well, did not rate her pain today but says overall her neck pain does not bother her, still some back and leg pain   Pertinent History  Chronic Kidney disease, Fibromyalgia, HTN, IBS, LBP, neuropathy,    Patient Stated Goals  To improve walking, move legs better. tolerating pain.    Pain Onset  More than a month ago    Pain Onset  More than a month ago                       [facility] Adult PT Treatment/Exercise - 09/21/18 0001      Ambulation/Gait   Gait Comments  220 ft X 2 without O2, needed seated rest break and pursed lip breathing instructions after each 220 ft set      Lumbar Exercises: Stretches   Passive Hamstring Stretch  Right;Left;2 reps;30 seconds    Passive Hamstring Stretch Limitations  seated with foot on stool    Lower Trunk Rotation Limitations  10 reps hold 3 sec, supine laying on wedge    Piriformis Stretch  Right;Left;2 reps;30 seconds    Piriformis Stretch Limitations  supine laying on wegde      Lumbar Exercises: Aerobic   Nustep  7 min L5 UE/LE      Lumbar Exercises: Seated   Sit to Stand Limitations  3X5 checking o2 levels after each set and allowing time for rebound and pursed lip breathing                PT Short Term Goals - 09/21/18 0844      PT SHORT TERM GOAL #1   Title  Patient verbalizes & demonstrates understanding of initial HEP. (All STGs Target Date: 09/09/2018)    Time  4    Period  Weeks    Status  Achieved    Target Date  09/09/18      PT SHORT TERM GOAL #2   Title  Patient reports 25% improvement in cervical pain.    Time  4    Period  Weeks    Status  Achieved    Target Date  09/09/18      PT SHORT TERM GOAL #3   Title  Patient reports 25% improvement in lumbar & buttocks / hip pain.    Time  4    Period  Weeks    Status  On-going    Target Date  09/09/18      PT SHORT TERM GOAL #4   Title  Berg Balance Test >/= 43/56    Baseline  not tested, will retest on progress note visit 10     Time  4    Period  Weeks    Status  On-going    Target Date  09/09/18      PT SHORT TERM GOAL #5   Title  Patient ambulates with head turns to scan environment without change in pace or path.    Time  4    Period  Weeks    Status  Achieved    Target Date  09/09/18      PT SHORT TERM GOAL #6   Title  Gait Velocity >2.5 ft/sec    Baseline  not tested today, will restest on progress note 10    Time  4    Period  Weeks    Status  On-going    Target Date  09/09/18        PT Long Term Goals - 08/10/18 1824      PT LONG TERM GOAL #1   Title  Patient verbalizes & demonstrates ongoing HEP / fitness plan and exercise program for [employer]. (All LTGs Target Date 11/06/2018)    Time  12    Period  Weeks    Status  New    Target Date  11/06/18      PT LONG TERM GOAL #2   Title  Patient reports cervical pain to </= 5/10 with activities.    Time  12    Period  Weeks    Status  New    Target Date  11/06/18      PT LONG TERM GOAL #3   Title  Patient reports lumbar & buttock / hip pain </= 4/10 with standing & gait activities.    Time  12    Period  Weeks    Status  New    Target Date  11/06/18      PT LONG TERM GOAL #4   Title  Berg Balance Test >/= 47/56 to indicate lower fall risk.    Time  12    Period  Weeks  Status  New    Target Date  11/06/18      PT LONG TERM GOAL #5   Title  Functional Gait Assessment >/= 15/30 to indicate lower fall risk.    Time  12    Period  Weeks    Status  New    Target Date  11/06/18      Additional Long Term Goals   Additional Long Term Goals  Yes      PT LONG TERM GOAL #6   Title  Gait Velocity >2.62 ft/sec to indicate fuller community mobility.    Time  12    Period  Weeks    Status  New    Target Date  11/06/18            Plan - 09/21/18 0841    Clinical Impression Statement  performed session without her supplemental O2, she had pulse ox on and does dip below 90% with ambulation or repeated sit to stands however she  was able to rebound to 97% with rest and pursed lip breathing. Then continued endurance training today without O2 with rest breaks as needed. No evidence of gait instability today and overall her balance is improving    Personal Factors and Comorbidities  Comorbidity 3+;Behavior Pattern;Fitness;Past/Current Experience;Social Background;Time since onset of injury/illness/exacerbation    Comorbidities  Chronic Kidney disease, Fibromyalgia, HTN, IBS, LBP, neuropathy,    Examination-Activity Limitations  Bend;Caring for Others;Carry;Lift;Locomotion Level;Reach Overhead;Squat;Stairs;Stand;Transfers    Examination-Participation Restrictions  Community Activity    Stability/Clinical Decision Making  Evolving/Moderate complexity    Rehab Potential  Good    PT Frequency  2x / week    PT Duration  12 weeks    PT Treatment/Interventions  ADLs/Self Care Home Management;Aquatic Therapy;Cryotherapy;Electrical Stimulation;Moist Heat;Traction;Ultrasound;DME Instruction;Gait training;Stair training;Functional mobility training;Therapeutic activities;Therapeutic exercise;Balance training;Neuromuscular re-education;Patient/family education;Manual techniques;Passive range of motion;Dry needling;Vestibular;Spinal Manipulations;Joint Manipulations    PT Next Visit Plan  check & add to HEP for spinal flexibility & core stabilization, gait with rollator walker including ramps & curbs,  manual therapy for pain management    PT Home Exercise Plan  Access Code: [email or web address]    Consulted and Agree with Plan of Care  Patient       Patient will benefit from skilled therapeutic intervention in order to improve the following deficits and impairments:  Abnormal gait, Decreased activity tolerance, Cardiopulmonary status limiting activity, Decreased balance, Decreased endurance, Decreased knowledge of use of DME, Decreased mobility, Decreased range of motion, Decreased strength, Dizziness, Increased edema, Impaired flexibility, Postural  dysfunction, Obesity, Pain  Visit Diagnosis: Muscle weakness (generalized)  Unsteadiness on feet  Other abnormalities of gait and mobility  Chronic midline low back pain without sciatica  Abnormal posture  Cervicalgia     Problem List Patient Active Problem List   Diagnosis Date Noted  . Vision blurring 09/17/2018  . Fall 06/30/2018  . Frequent urination at night 04/23/2018  . Chronic cough 03/04/2018  . Dental infection 12/05/2017  . Right arm pain 12/05/2017  . Chronic respiratory failure with hypoxia ([place])   . Low serum vitamin B12 05/02/2017  . Palpitations 04/11/2017  . Nutritional anemia 06/17/2016  . Allergic rhinitis 06/12/2016  . Vitamin D deficiency, unspecified 06/12/2016  . Nicotine dependence 06/12/2016  . Menorrhagia 01/08/2016  . Migraines 01/08/2016  . Bilateral leg pain 11/29/2015  . Hemorrhoids 07/05/2015  . Fibromyalgia 07/05/2015  . Chronic leg pain 09/26/2014  . Healthcare maintenance 09/26/2014  . Morbid obesity ([place]) 07/04/2014  . Major depressive disorder, recurrent  episode, moderate ([place]) 10/13/2012  . Generalized anxiety disorder 10/13/2012  . Chronic nausea 05/10/2010  . IBS (irritable bowel syndrome) 06/16/2007  . Essential hypertension 02/06/2006  . HIDRADENITIS 11/18/2005    Debbe Odea, PT,DPT 09/21/2018, 8:47 AM  [facility] [address] [place], [place], [address] Phone: [phone]   Fax:  [phone]  Name: Lindsay [name] MRN: [id number] Date of Birth: [birth year]

## 2018-09-22 ENCOUNTER — Telehealth: Payer: Self-pay | Admitting: Internal Medicine

## 2018-09-22 NOTE — Telephone Encounter (Signed)
Pt is requesting a call back  [phone] °

## 2018-09-23 ENCOUNTER — Other Ambulatory Visit: Payer: Self-pay

## 2018-09-23 ENCOUNTER — Other Ambulatory Visit: Payer: Self-pay | Admitting: Internal Medicine

## 2018-09-23 ENCOUNTER — Ambulatory Visit: Payer: Medicare Other | Admitting: Family Medicine

## 2018-09-23 ENCOUNTER — Encounter (HOSPITAL_COMMUNITY): Payer: Self-pay

## 2018-09-23 ENCOUNTER — Ambulatory Visit: Payer: Medicare Other | Admitting: Physical Therapy

## 2018-09-23 ENCOUNTER — Encounter: Payer: Self-pay | Admitting: Internal Medicine

## 2018-09-23 DIAGNOSIS — M545 Low back pain: Secondary | ICD-10-CM | POA: Diagnosis not present

## 2018-09-23 DIAGNOSIS — G8929 Other chronic pain: Secondary | ICD-10-CM

## 2018-09-23 DIAGNOSIS — M6281 Muscle weakness (generalized): Secondary | ICD-10-CM | POA: Diagnosis not present

## 2018-09-23 DIAGNOSIS — M542 Cervicalgia: Secondary | ICD-10-CM

## 2018-09-23 DIAGNOSIS — R293 Abnormal posture: Secondary | ICD-10-CM

## 2018-09-23 DIAGNOSIS — R2689 Other abnormalities of gait and mobility: Secondary | ICD-10-CM | POA: Diagnosis not present

## 2018-09-23 DIAGNOSIS — R2681 Unsteadiness on feet: Secondary | ICD-10-CM | POA: Diagnosis not present

## 2018-09-23 DIAGNOSIS — H538 Other visual disturbances: Secondary | ICD-10-CM

## 2018-09-23 NOTE — Telephone Encounter (Signed)
Letter done and faxed to Dr. Primus Bravo. Patient is asking about a substance abuse program, I am forwarding this message to [facility] to call patient back.

## 2018-09-23 NOTE — Progress Notes (Signed)
Ophthalmology referral placed per patient request.

## 2018-09-23 NOTE — Progress Notes (Signed)
Letter sent.

## 2018-09-23 NOTE — Therapy (Signed)
[facility] [address] [place], [place], [address] Phone: [phone]   Fax:  [phone]  Physical Therapy Treatment  Patient Details  Name: Lindsay [name] MRN: [id number] Date of Birth: 08-07-1970 Referring Provider (PT): Gilles Chiquito, MD   Encounter Date: 09/23/2018  PT End of Session - 09/23/18 0814    Visit Number  9    Number of Visits  25    Date for PT Re-Evaluation  11/06/18    Authorization Type  [employer]HC Medicare & Medicaid    PT Start Time  0800    PT Stop Time  0838    PT Time Calculation (min)  38 min    Equipment Utilized During Treatment  --   arrived without her oxygen on & PT had her put on her oxygen   Activity Tolerance  Patient tolerated treatment well    Behavior During Therapy  [facility] for tasks assessed/performed       Past Medical History:  Diagnosis Date  . Acid reflux   . Anemia    Iron Def  . Anorexia   . Chronic kidney disease    Nephrotic syndrome  . Colon polyp 2009  . Depression with anxiety   . Edema leg   . Fibromyalgia   . Hemorrhoids   . Hidradenitis suppurativa   . Hypertension   . IBS (irritable bowel syndrome)   . Low back pain   . Migraines   . Morbidly obese ([place])   . Neuromuscular disorder (HCC)    fibromyalgia  . Neuropathy   . Panic attacks   . Polyp of vocal cord or larynx   . Tonsil pain     Past Surgical History:  Procedure Laterality Date  . AXILLARY HIDRADENITIS EXCISION    . COLONOSCOPY WITH PROPOFOL N/A 05/25/2015   Procedure: COLONOSCOPY WITH PROPOFOL;  Surgeon: Milus Banister, MD;  Location: [facility];  Service: Endoscopy;  Laterality: N/A;  . HEMORRHOID SURGERY     with Hidradenitis surgery   . INGUINAL HIDRADENITIS EXCISION    . TONSILLECTOMY  10/18/2010   by Dr. Wilburn Cornelia  . UPPER GASTROINTESTINAL ENDOSCOPY      There were no vitals filed for this visit.  Subjective Assessment - 09/23/18 0813    Subjective  Relays 9/10 pain today and  that her stomach hurts. she also feels more unsteady on her feet today    Pertinent History  Chronic Kidney disease, Fibromyalgia, HTN, IBS, LBP, neuropathy,    Patient Stated Goals  To improve walking, move legs better. tolerating pain.    Pain Onset  More than a month ago    Pain Onset  More than a month ago                       [facility] Adult PT Treatment/Exercise - 09/23/18 0001      Ambulation/Gait   Gait Comments  110 ft X 1 with supervision, only able to complete one lap today due to SOB      High Level Balance   High Level Balance Comments  in bars, tandem walk and retro walk up/down X 3 ea with intermit UE support as needed      Lumbar Exercises: Stretches   Passive Hamstring Stretch  Right;Left;2 reps;30 seconds    Passive Hamstring Stretch Limitations  seated with foot on stool    Lower Trunk Rotation Limitations  --    Piriformis Stretch  --    Piriformis  Stretch Limitations  --    Gastroc Stretch  Right;Left;2 reps;30 seconds      Lumbar Exercises: Aerobic   Nustep  7 min L5 UE/LE      Lumbar Exercises: Standing   Other Standing Lumbar Exercises  standing hip flexion, abd, and ext X 10 ea with intermitt UE support PRN               PT Short Term Goals - 09/21/18 0844      PT SHORT TERM GOAL #1   Title  Patient verbalizes & demonstrates understanding of initial HEP. (All STGs Target Date: 09/09/2018)    Time  4    Period  Weeks    Status  Achieved    Target Date  09/09/18      PT SHORT TERM GOAL #2   Title  Patient reports 25% improvement in cervical pain.    Time  4    Period  Weeks    Status  Achieved    Target Date  09/09/18      PT SHORT TERM GOAL #3   Title  Patient reports 25% improvement in lumbar & buttocks / hip pain.    Time  4    Period  Weeks    Status  On-going    Target Date  09/09/18      PT SHORT TERM GOAL #4   Title  Berg Balance Test >/= 43/56    Baseline  not tested, will retest on progress note visit 10     Time  4    Period  Weeks    Status  On-going    Target Date  09/09/18      PT SHORT TERM GOAL #5   Title  Patient ambulates with head turns to scan environment without change in pace or path.    Time  4    Period  Weeks    Status  Achieved    Target Date  09/09/18      PT SHORT TERM GOAL #6   Title  Gait Velocity >2.5 ft/sec    Baseline  not tested today, will restest on progress note 10    Time  4    Period  Weeks    Status  On-going    Target Date  09/09/18        PT Long Term Goals - 08/10/18 1824      PT LONG TERM GOAL #1   Title  Patient verbalizes & demonstrates ongoing HEP / fitness plan and exercise program for [employer]. (All LTGs Target Date 11/06/2018)    Time  12    Period  Weeks    Status  New    Target Date  11/06/18      PT LONG TERM GOAL #2   Title  Patient reports cervical pain to </= 5/10 with activities.    Time  12    Period  Weeks    Status  New    Target Date  11/06/18      PT LONG TERM GOAL #3   Title  Patient reports lumbar & buttock / hip pain </= 4/10 with standing & gait activities.    Time  12    Period  Weeks    Status  New    Target Date  11/06/18      PT LONG TERM GOAL #4   Title  Berg Balance Test >/= 47/56 to indicate lower fall risk.    Time  12  Period  Weeks    Status  New    Target Date  11/06/18      PT LONG TERM GOAL #5   Title  Functional Gait Assessment >/= 15/30 to indicate lower fall risk.    Time  12    Period  Weeks    Status  New    Target Date  11/06/18      Additional Long Term Goals   Additional Long Term Goals  Yes      PT LONG TERM GOAL #6   Title  Gait Velocity >2.62 ft/sec to indicate fuller community mobility.    Time  12    Period  Weeks    Status  New    Target Date  11/06/18            Plan - 09/23/18 1036    Clinical Impression Statement  She was more fatiged and SOB today, she needed to don her supplemental O2 two times as her numbers did not rebound with rest breaks. Her O2 was less  than 80% with light activity and needed 2L of 02 for a minute to rebound to >90%. She reported being more unsteady so more dynamic balance training added today.    Comorbidities  Chronic Kidney disease, Fibromyalgia, HTN, IBS, LBP, neuropathy,    Rehab Potential  Good    PT Frequency  2x / week    PT Treatment/Interventions  ADLs/Self Care Home Management;Aquatic Therapy;Cryotherapy;Electrical Stimulation;Moist Heat;Traction;Ultrasound;DME Instruction;Gait training;Stair training;Functional mobility training;Therapeutic activities;Therapeutic exercise;Balance training;Neuromuscular re-education;Patient/family education;Manual techniques;Passive range of motion;Dry needling;Vestibular;Spinal Manipulations;Joint Manipulations    PT Next Visit Plan  will need progress note next visit, needs endurance, leg strength, dynamic balance, monitor O2 levels    PT Home Exercise Plan  Access Code: [email or web address]    Consulted and Agree with Plan of Care  Patient       Patient will benefit from skilled therapeutic intervention in order to improve the following deficits and impairments:     Visit Diagnosis: Muscle weakness (generalized)  Unsteadiness on feet  Other abnormalities of gait and mobility  Chronic midline low back pain without sciatica  Abnormal posture  Cervicalgia     Problem List Patient Active Problem List   Diagnosis Date Noted  . Vision blurring 09/17/2018  . Fall 06/30/2018  . Frequent urination at night 04/23/2018  . Chronic cough 03/04/2018  . Dental infection 12/05/2017  . Right arm pain 12/05/2017  . Chronic respiratory failure with hypoxia ([place])   . Low serum vitamin B12 05/02/2017  . Palpitations 04/11/2017  . Nutritional anemia 06/17/2016  . Allergic rhinitis 06/12/2016  . Vitamin D deficiency, unspecified 06/12/2016  . Nicotine dependence 06/12/2016  . Menorrhagia 01/08/2016  . Migraines 01/08/2016  . Bilateral leg pain 11/29/2015  . Hemorrhoids 07/05/2015  .  Fibromyalgia 07/05/2015  . Chronic leg pain 09/26/2014  . Healthcare maintenance 09/26/2014  . Morbid obesity ([place]) 07/04/2014  . Major depressive disorder, recurrent episode, moderate ([place]) 10/13/2012  . Generalized anxiety disorder 10/13/2012  . Chronic nausea 05/10/2010  . IBS (irritable bowel syndrome) 06/16/2007  . Essential hypertension 02/06/2006  . HIDRADENITIS 11/18/2005    Silvestre Mesi 09/23/2018, 10:41 AM  [facility] [address] [place], [place], [address] Phone: [phone]   Fax:  [phone]  Name: Lindsay [name] MRN: [id number] Date of Birth: Jun 11, 1970

## 2018-09-25 ENCOUNTER — Other Ambulatory Visit: Payer: Self-pay | Admitting: Internal Medicine

## 2018-09-25 DIAGNOSIS — J9620 Acute and chronic respiratory failure, unspecified whether with hypoxia or hypercapnia: Secondary | ICD-10-CM | POA: Diagnosis not present

## 2018-09-25 DIAGNOSIS — I10 Essential (primary) hypertension: Secondary | ICD-10-CM

## 2018-09-28 ENCOUNTER — Ambulatory Visit: Payer: Medicare Other | Admitting: Pulmonary Disease

## 2018-09-28 ENCOUNTER — Other Ambulatory Visit: Payer: Self-pay | Admitting: Pulmonary Disease

## 2018-09-28 NOTE — Telephone Encounter (Signed)
Scheduled CCA for 10/02/18 at 11am

## 2018-09-29 ENCOUNTER — Other Ambulatory Visit: Payer: Self-pay

## 2018-09-29 ENCOUNTER — Other Ambulatory Visit: Payer: Self-pay | Admitting: Gastroenterology

## 2018-09-29 ENCOUNTER — Ambulatory Visit: Payer: Medicare Other | Attending: Internal Medicine | Admitting: Physical Therapy

## 2018-09-29 ENCOUNTER — Telehealth: Payer: Self-pay | Admitting: Gastroenterology

## 2018-09-29 ENCOUNTER — Encounter: Payer: Self-pay | Admitting: Physical Therapy

## 2018-09-29 DIAGNOSIS — R2689 Other abnormalities of gait and mobility: Secondary | ICD-10-CM | POA: Insufficient documentation

## 2018-09-29 DIAGNOSIS — R293 Abnormal posture: Secondary | ICD-10-CM | POA: Diagnosis not present

## 2018-09-29 DIAGNOSIS — M6281 Muscle weakness (generalized): Secondary | ICD-10-CM | POA: Diagnosis not present

## 2018-09-29 DIAGNOSIS — M545 Low back pain, unspecified: Secondary | ICD-10-CM

## 2018-09-29 DIAGNOSIS — M542 Cervicalgia: Secondary | ICD-10-CM | POA: Insufficient documentation

## 2018-09-29 DIAGNOSIS — G8929 Other chronic pain: Secondary | ICD-10-CM | POA: Insufficient documentation

## 2018-09-29 DIAGNOSIS — R2681 Unsteadiness on feet: Secondary | ICD-10-CM | POA: Insufficient documentation

## 2018-09-29 MED ORDER — PROMETHAZINE HCL 12.5 MG PO TABS: 12.5000 mg | ORAL_TABLET | Freq: Four times a day (QID) | ORAL | 1 refills | Status: DC | PRN

## 2018-09-29 NOTE — Therapy (Addendum)
[facility] [address] [place], [place], [address] Phone: [phone]   Fax:  [phone]  Physical Therapy Treatment & 10th Visit Progress Note  Patient Details  Name: Lindsay [name] MRN: [id number] Date of Birth: February 04, 1970 Referring Provider (PT): Gilles Chiquito, MD   Encounter Date: 09/29/2018   CLINIC OPERATION CHANGES: [facility]patient [facility] is open at lower capacity following universal masking, social distancing, and patient screening.  The patient's COVID risk of complications score is 3.  Progress Note Reporting Period 08/10/2018 to 09/29/2018  See note below for Objective Data and Assessment of Progress/Goals.        PT End of Session - 09/29/18 0917    Visit Number  10    Number of Visits  25    Date for PT Re-Evaluation  11/06/18    Authorization Type  UHC Medicare & Medicaid    PT Start Time  0710    PT Stop Time  0800    PT Time Calculation (min)  50 min    Equipment Utilized During Treatment  Oxygen   arrived without her oxygen on & PT had her put on her oxygen   Activity Tolerance  Patient tolerated treatment well;Patient limited by pain    Behavior During Therapy  [facility] for tasks assessed/performed       Past Medical History:  Diagnosis Date  . Acid reflux   . Anemia    Iron Def  . Anorexia   . Chronic kidney disease    Nephrotic syndrome  . Colon polyp 2009  . Depression with anxiety   . Edema leg   . Fibromyalgia   . Hemorrhoids   . Hidradenitis suppurativa   . Hypertension   . IBS (irritable bowel syndrome)   . Low back pain   . Migraines   . Morbidly obese ([place])   . Neuromuscular disorder (HCC)    fibromyalgia  . Neuropathy   . Panic attacks   . Polyp of vocal cord or larynx   . Tonsil pain     Past Surgical History:  Procedure Laterality Date  . AXILLARY HIDRADENITIS EXCISION    . COLONOSCOPY WITH PROPOFOL N/A 05/25/2015   Procedure: COLONOSCOPY WITH PROPOFOL;  Surgeon:  Milus Banister, MD;  Location: [facility];  Service: Endoscopy;  Laterality: N/A;  . HEMORRHOID SURGERY     with Hidradenitis surgery   . INGUINAL HIDRADENITIS EXCISION    . TONSILLECTOMY  10/18/2010   by Dr. Wilburn Cornelia  . UPPER GASTROINTESTINAL ENDOSCOPY      There were no vitals filed for this visit.  Subjective Assessment - 09/29/18 0708    Subjective  She is stiffer in mornings & her back hurts worse. She has not tried stretches yet this morning. She tried yesterday but was sick to her stomach.    Pertinent History  Chronic Kidney disease, Fibromyalgia, HTN, IBS, LBP, neuropathy,    Patient Stated Goals  To improve walking, move legs better. tolerating pain.    Currently in Pain?  Yes    Pain Score  9    in last week, worst 10/10, best 7/10   Pain Location  Back    Pain Orientation  Lower;Right;Left   right > left   Pain Descriptors / Indicators  Throbbing;Sharp    Pain Type  Chronic pain    Pain Onset  More than a month ago    Pain Frequency  Constant    Aggravating Factors   urinating increasing sharp  pain in area of kidneys,  epidural is constant throbbing    Pain Relieving Factors  on its own, nothing helps to decrease it (heat, ice, medications, exercises)    Pain Score  8   in last week, worst 10/10,  best 8/10   Pain Location  Leg   knee to foot   Pain Orientation  Right;Left    Pain Descriptors / Indicators  Numbness;Tingling;Sharp    Pain Type  Chronic pain    Pain Onset  More than a month ago    Pain Frequency  Constant    Aggravating Factors   standing & walking    Pain Relieving Factors  nothing         Patient ambulated from lobby to gym 54' carrying oxygen tank with O2 with supervision. O2 Saturation 91%  Patient education provided while seated with back support & LEs positioned on Yoga Blocks to level thigh in sitting. Discussed how to do this at home. Pt verbalized understanding.   SciFit 2 minutes Level 1 with BUEs & BLEs O2 Saturation 90%    Seated rest for 2 minutes & O2 Saturation 96%                      PT Education - 09/29/18 0755    Education Details  See pt instructions for benefits of rollator walker for community & well-rounded exercise program, sitting LE posture    Person(s) Educated  Patient    Methods  Explanation;Demonstration;Verbal cues    Comprehension  Verbalized understanding;Returned demonstration;Need further instruction       PT Short Term Goals - 09/29/18 [id number]      PT SHORT TERM GOAL #1   Title  Patient verbalizes & demonstrates understanding of initial HEP. (All STGs Target Date: 09/09/2018)    Time  4    Period  Weeks    Status  Achieved    Target Date  09/09/18      PT SHORT TERM GOAL #2   Title  Patient reports 25% improvement in cervical pain.    Time  4    Period  Weeks    Status  Achieved    Target Date  09/09/18      PT SHORT TERM GOAL #3   Title  Patient reports 25% improvement in lumbar & buttocks / hip pain.    Time  4    Period  Weeks    Status  On-going    Target Date  10/09/18      PT SHORT TERM GOAL #4   Title  Berg Balance Test >/= 43/56    Baseline  not tested, will retest on progress note visit 10    Time  4    Period  Weeks    Status  On-going    Target Date  10/09/18      PT SHORT TERM GOAL #5   Title  Patient ambulates with head turns to scan environment without change in pace or path.    Time  4    Period  Weeks    Status  Achieved      PT SHORT TERM GOAL #6   Title  Gait Velocity >2.5 ft/sec    Baseline  not tested today, will restest on progress note 10    Time  4    Period  Weeks    Status  On-going    Target Date  10/09/18        PT  Long Term Goals - 08/10/18 1824      PT LONG TERM GOAL #1   Title  Patient verbalizes & demonstrates ongoing HEP / fitness plan and exercise program for [employer]. (All LTGs Target Date 11/06/2018)    Time  12    Period  Weeks    Status  New    Target Date  11/06/18      PT LONG TERM GOAL #2    Title  Patient reports cervical pain to </= 5/10 with activities.    Time  12    Period  Weeks    Status  New    Target Date  11/06/18      PT LONG TERM GOAL #3   Title  Patient reports lumbar & buttock / hip pain </= 4/10 with standing & gait activities.    Time  12    Period  Weeks    Status  New    Target Date  11/06/18      PT LONG TERM GOAL #4   Title  Berg Balance Test >/= 47/56 to indicate lower fall risk.    Time  12    Period  Weeks    Status  New    Target Date  11/06/18      PT LONG TERM GOAL #5   Title  Functional Gait Assessment >/= 15/30 to indicate lower fall risk.    Time  12    Period  Weeks    Status  New    Target Date  11/06/18      Additional Long Term Goals   Additional Long Term Goals  Yes      PT LONG TERM GOAL #6   Title  Gait Velocity >2.62 ft/sec to indicate fuller community mobility.    Time  12    Period  Weeks    Status  New    Target Date  11/06/18            Plan - 09/29/18 0919    Clinical Impression Statement  Today's skilled PT session focused on patient education to address behaviors that limit her function & mobility.  She is still waiting on [place] to get a bariatric rollator.  She appears to understand education provided regarding rollator walker use, compliance with HEP & well rounded exercise program and sitting posture /LE positioning.    Comorbidities  Chronic Kidney disease, Fibromyalgia, HTN, IBS, LBP, neuropathy,    Rehab Potential  Good    PT Frequency  2x / week    PT Treatment/Interventions  ADLs/Self Care Home Management;Aquatic Therapy;Cryotherapy;Electrical Stimulation;Moist Heat;Traction;Ultrasound;DME Instruction;Gait training;Stair training;Functional mobility training;Therapeutic activities;Therapeutic exercise;Balance training;Neuromuscular re-education;Patient/family education;Manual techniques;Passive range of motion;Dry needling;Vestibular;Spinal Manipulations;Joint Manipulations    PT Next Visit  Plan  check if she has gotten her rollator walker yet, needs endurance, leg strength, dynamic balance, monitor O2 levels    PT Home Exercise Plan  Access Code: [email or web address]    Consulted and Agree with Plan of Care  Patient       Patient will benefit from skilled therapeutic intervention in order to improve the following deficits and impairments:     Visit Diagnosis: Unsteadiness on feet  Muscle weakness (generalized)  Other abnormalities of gait and mobility  Chronic midline low back pain without sciatica  Abnormal posture  Cervicalgia     Problem List Patient Active Problem List   Diagnosis Date Noted  . Vision blurring 09/17/2018  . Fall 06/30/2018  . Frequent urination at  night 04/23/2018  . Chronic cough 03/04/2018  . Dental infection 12/05/2017  . Right arm pain 12/05/2017  . Chronic respiratory failure with hypoxia ([place])   . Low serum vitamin B12 05/02/2017  . Palpitations 04/11/2017  . Nutritional anemia 06/17/2016  . Allergic rhinitis 06/12/2016  . Vitamin D deficiency, unspecified 06/12/2016  . Nicotine dependence 06/12/2016  . Menorrhagia 01/08/2016  . Migraines 01/08/2016  . Bilateral leg pain 11/29/2015  . Hemorrhoids 07/05/2015  . Fibromyalgia 07/05/2015  . Chronic leg pain 09/26/2014  . Healthcare maintenance 09/26/2014  . Morbid obesity ([place]) 07/04/2014  . Major depressive disorder, recurrent episode, moderate ([place]) 10/13/2012  . Generalized anxiety disorder 10/13/2012  . Chronic nausea 05/10/2010  . IBS (irritable bowel syndrome) 06/16/2007  . Essential hypertension 02/06/2006  . HIDRADENITIS 11/18/2005    Kegan Mckeithan PT, DPT 09/29/2018, 9:24 AM  [facility] [address] [place], [place], [address] Phone: [phone]   Fax:  [phone]  Name: Lindsay [name] MRN: [id number] Date of Birth: 1970-07-31

## 2018-09-29 NOTE — Patient Instructions (Signed)
Benefits to using rollator walker for community mobility:  Transport oxygen, decrease energy expenditure with possible longer distances tolerated, support back with upright posture, permit resting point and transport objects like groceries.  Benefits & need for well-rounded exercise program to decrease sedentary issues, improve overall mobility & function, decrease pain limiting her function and decrease fall risk.  Well-rounded program addressed endurance, strength, flexibility and balance. Goal is to address each area at least 3 times per week.  You can divide into groups based on location like bed, sitting, standing near counter, etc. You can use Silver Sneakers to join [employer]. Using machine seated that uses both arms & legs to operate to do sets to build overall tolerance / endurance.  All exercises should be low intensity and not strenuous.

## 2018-09-29 NOTE — Telephone Encounter (Signed)
Sent phenergan 12.5 mg to take Q6hr PRN for nausea to [facility]. Pharmacy will contact patient when ready for pick up.

## 2018-09-30 DIAGNOSIS — M542 Cervicalgia: Secondary | ICD-10-CM | POA: Diagnosis not present

## 2018-09-30 DIAGNOSIS — M940 Chondrocostal junction syndrome [Tietze]: Secondary | ICD-10-CM | POA: Diagnosis not present

## 2018-09-30 DIAGNOSIS — M545 Low back pain: Secondary | ICD-10-CM | POA: Diagnosis not present

## 2018-09-30 DIAGNOSIS — G894 Chronic pain syndrome: Secondary | ICD-10-CM | POA: Diagnosis not present

## 2018-10-01 ENCOUNTER — Encounter: Payer: Self-pay | Admitting: Physical Therapy

## 2018-10-01 ENCOUNTER — Ambulatory Visit: Payer: Medicare Other | Admitting: Physical Therapy

## 2018-10-01 ENCOUNTER — Other Ambulatory Visit: Payer: Self-pay

## 2018-10-01 DIAGNOSIS — R293 Abnormal posture: Secondary | ICD-10-CM | POA: Diagnosis not present

## 2018-10-01 DIAGNOSIS — M542 Cervicalgia: Secondary | ICD-10-CM

## 2018-10-01 DIAGNOSIS — R2689 Other abnormalities of gait and mobility: Secondary | ICD-10-CM | POA: Diagnosis not present

## 2018-10-01 DIAGNOSIS — M6281 Muscle weakness (generalized): Secondary | ICD-10-CM | POA: Diagnosis not present

## 2018-10-01 DIAGNOSIS — M545 Low back pain, unspecified: Secondary | ICD-10-CM

## 2018-10-01 DIAGNOSIS — R2681 Unsteadiness on feet: Secondary | ICD-10-CM | POA: Diagnosis not present

## 2018-10-01 DIAGNOSIS — G8929 Other chronic pain: Secondary | ICD-10-CM | POA: Diagnosis not present

## 2018-10-01 NOTE — Therapy (Signed)
[facility] [address] [place], [place], [address] Phone: [phone]   Fax:  [phone]  Physical Therapy Treatment  Patient Details  Name: Lindsay [name] MRN: [id number] Date of Birth: [birth year] Referring Provider (PT): Gilles Chiquito, MD   Encounter Date: 10/01/2018  PT End of Session - 10/01/18 1128    Visit Number  11    Number of Visits  25    Date for PT Re-Evaluation  11/06/18    Authorization Type  UHC Medicare & Medicaid    PT Start Time  0802    PT Stop Time  0848    PT Time Calculation (min)  46 min    Equipment Utilized During Treatment  Oxygen   arrived without her oxygen on & PT had her put on her oxygen   Activity Tolerance  Patient tolerated treatment well    Behavior During Therapy  [facility] for tasks assessed/performed       Past Medical History:  Diagnosis Date  . Acid reflux   . Anemia    Iron Def  . Anorexia   . Chronic kidney disease    Nephrotic syndrome  . Colon polyp 2009  . Depression with anxiety   . Edema leg   . Fibromyalgia   . Hemorrhoids   . Hidradenitis suppurativa   . Hypertension   . IBS (irritable bowel syndrome)   . Low back pain   . Migraines   . Morbidly obese ([place])   . Neuromuscular disorder (HCC)    fibromyalgia  . Neuropathy   . Panic attacks   . Polyp of vocal cord or larynx   . Tonsil pain     Past Surgical History:  Procedure Laterality Date  . AXILLARY HIDRADENITIS EXCISION    . COLONOSCOPY WITH PROPOFOL N/A 05/25/2015   Procedure: COLONOSCOPY WITH PROPOFOL;  Surgeon: Milus Banister, MD;  Location: [facility];  Service: Endoscopy;  Laterality: N/A;  . HEMORRHOID SURGERY     with Hidradenitis surgery   . INGUINAL HIDRADENITIS EXCISION    . TONSILLECTOMY  10/18/2010   by Dr. Wilburn Cornelia  . UPPER GASTROINTESTINAL ENDOSCOPY      There were no vitals filed for this visit.  Subjective Assessment - 10/01/18 0804    Subjective  Pt reports feeling so-so  due to the fatigue and L knee pain.  Has a question about her exercises.    Pertinent History  Chronic Kidney disease, Fibromyalgia, HTN, IBS, LBP, neuropathy,    Patient Stated Goals  To improve walking, move legs better. tolerating pain.    Currently in Pain?  Yes    Pain Onset  More than a month ago    Pain Onset  More than a month ago         Access Code: [email or web address]: [email or web address]  Date: 10/01/2018  Prepared by: Misty Stanley   Exercises Seated Cervical Traction - 3 reps - 1 sets - 10 hold - 1x daily - 7x weekly Seated Assisted Cervical Rotation with Towel - 3 reps - 1 sets - 10 hold - 1x daily - 7x weekly Seated Upper Trapezius Stretch - 2 reps - 1 sets - 30 hold - 2x daily - 7x weekly Seated Levator Scapulae Stretch - 3 reps - 1 sets - 10 hold - 1x daily - 7x weekly Seated Cat Camel - 10 reps - 3 sets - 1x daily - 7x weekly Standing Shoulder Horizontal Abduction - Palms Down - 10 reps - 3  sets - 1x daily - 7x weekly Standing 'L' Stretch at [employer] - 2 reps - 1 sets - 30 hold - 2x daily - 7x weekly  Patient Education: Low Back Pain Handout     PT Education - 10/01/18 1125    Education Details  role of movement in joint health and mobility and importance of lifelong wellness and exercise, how stress affects fatigue and pain, how lack of sleep affects fatigue and pain; updated HEP    Person(s) Educated  Patient    Methods  Explanation;Demonstration;Handout    Comprehension  Verbalized understanding;Returned demonstration       PT Short Term Goals - 09/29/18 0918      PT SHORT TERM GOAL #1   Title  Patient verbalizes & demonstrates understanding of initial HEP. (All STGs Target Date: 09/09/2018)    Time  4    Period  Weeks    Status  Achieved    Target Date  09/09/18      PT SHORT TERM GOAL #2   Title  Patient reports 25% improvement in cervical pain.    Time  4    Period  Weeks    Status  Achieved    Target Date  09/09/18      PT SHORT  TERM GOAL #3   Title  Patient reports 25% improvement in lumbar & buttocks / hip pain.    Time  4    Period  Weeks    Status  On-going    Target Date  10/09/18      PT SHORT TERM GOAL #4   Title  Berg Balance Test >/= 43/56    Baseline  not tested, will retest on progress note visit 10    Time  4    Period  Weeks    Status  On-going    Target Date  10/09/18      PT SHORT TERM GOAL #5   Title  Patient ambulates with head turns to scan environment without change in pace or path.    Time  4    Period  Weeks    Status  Achieved      PT SHORT TERM GOAL #6   Title  Gait Velocity >2.5 ft/sec    Baseline  not tested today, will restest on progress note 10    Time  4    Period  Weeks    Status  On-going    Target Date  10/09/18        PT Long Term Goals - 08/10/18 1824      PT LONG TERM GOAL #1   Title  Patient verbalizes & demonstrates ongoing HEP / fitness plan and exercise program for [employer]. (All LTGs Target Date 11/06/2018)    Time  12    Period  Weeks    Status  New    Target Date  11/06/18      PT LONG TERM GOAL #2   Title  Patient reports cervical pain to </= 5/10 with activities.    Time  12    Period  Weeks    Status  New    Target Date  11/06/18      PT LONG TERM GOAL #3   Title  Patient reports lumbar & buttock / hip pain </= 4/10 with standing & gait activities.    Time  12    Period  Weeks    Status  New    Target Date  11/06/18  PT LONG TERM GOAL #4   Title  Berg Balance Test >/= 47/56 to indicate lower fall risk.    Time  12    Period  Weeks    Status  New    Target Date  11/06/18      PT LONG TERM GOAL #5   Title  Functional Gait Assessment >/= 15/30 to indicate lower fall risk.    Time  12    Period  Weeks    Status  New    Target Date  11/06/18      Additional Long Term Goals   Additional Long Term Goals  Yes      PT LONG TERM GOAL #6   Title  Gait Velocity >2.62 ft/sec to indicate fuller community mobility.    Time  12    Period   Weeks    Status  New    Target Date  11/06/18            Plan - 10/01/18 1128    Clinical Impression Statement  Pt having difficulty recalling all exercises and keeping up with handouts.  Pt has two separate Medbridge exercise programs so session focused on condensing, revising and reviewing first set of exercises focusing on spinal, shoulder and back ROM through active movement and breathing.  Removed exercises that required excessive trunk flexion or quadruped due to difficulty with breathing and pt unable to tolerate pressure on knee right now.  During exercises continued to educate pt on benefits of lifelong wellness and movement, current barriers to movement, role of therapy and patient's role to continue activity outside of therapy.  Pt reports she is looking into the [employer] so she can use the [employer].  Also continued education on benefits of movement, blood flow and 02 to decrease pain and fatigue and how stress and lack of sleep negatively impact fatigue and pain.  Discussed how exercises today can be used to reduce stiffness in the morning "motion is lotion".  Pt verbalized understanding.  Will continue to address in order to progress towards LTG.    Comorbidities  Chronic Kidney disease, Fibromyalgia, HTN, IBS, LBP, neuropathy,    Rehab Potential  Good    PT Frequency  2x / week    PT Treatment/Interventions  ADLs/Self Care Home Management;Aquatic Therapy;Cryotherapy;Electrical Stimulation;Moist Heat;Traction;Ultrasound;DME Instruction;Gait training;Stair training;Functional mobility training;Therapeutic activities;Therapeutic exercise;Balance training;Neuromuscular re-education;Patient/family education;Manual techniques;Passive range of motion;Dry needling;Vestibular;Spinal Manipulations;Joint Manipulations    PT Next Visit Plan  she has not received her rollator yet.  She has two medbridge exercise programs.  We condensed the first one but the second one needs to be condensed and added to  first so she has it all together; need to add LE strengthening to HEP (sit <> stand, standing strengthening).  Continue endurance on SCI fit    PT Home Exercise Plan  Access Code: [email or web address] (original exercises); second one [email or web address] - needs to be condensed and added to first one    Consulted and Agree with Plan of Care  Patient       Patient will benefit from skilled therapeutic intervention in order to improve the following deficits and impairments:     Visit Diagnosis: Unsteadiness on feet  Muscle weakness (generalized)  Other abnormalities of gait and mobility  Chronic midline low back pain without sciatica  Cervicalgia  Abnormal posture     Problem List Patient Active Problem List   Diagnosis Date Noted  . Vision blurring 09/17/2018  . Fall 06/30/2018  . Frequent urination  at night 04/23/2018  . Chronic cough 03/04/2018  . Dental infection 12/05/2017  . Right arm pain 12/05/2017  . Chronic respiratory failure with hypoxia ([place])   . Low serum vitamin B12 05/02/2017  . Palpitations 04/11/2017  . Nutritional anemia 06/17/2016  . Allergic rhinitis 06/12/2016  . Vitamin D deficiency, unspecified 06/12/2016  . Nicotine dependence 06/12/2016  . Menorrhagia 01/08/2016  . Migraines 01/08/2016  . Bilateral leg pain 11/29/2015  . Hemorrhoids 07/05/2015  . Fibromyalgia 07/05/2015  . Chronic leg pain 09/26/2014  . Healthcare maintenance 09/26/2014  . Morbid obesity ([place]) 07/04/2014  . Major depressive disorder, recurrent episode, moderate ([place]) 10/13/2012  . Generalized anxiety disorder 10/13/2012  . Chronic nausea 05/10/2010  . IBS (irritable bowel syndrome) 06/16/2007  . Essential hypertension 02/06/2006  . HIDRADENITIS 11/18/2005   Rico Junker, PT, DPT 10/01/18    11:37 AM    [facility] [address] [place], [place], [address] Phone: [phone]   Fax:  [phone]  Name: Lindsay [name] MRN:  [id number] Date of Birth: [birth year]

## 2018-10-01 NOTE — Patient Instructions (Addendum)
Access Code: [email or web address]  URL: [email or web address]  Date: 10/01/2018  Prepared by: Misty Stanley   Exercises Seated Cervical Traction - 3 reps - 1 sets - 10 hold - 1x daily - 7x weekly Seated Assisted Cervical Rotation with Towel - 3 reps - 1 sets - 10 hold - 1x daily - 7x weekly Seated Upper Trapezius Stretch - 2 reps - 1 sets - 30 hold - 2x daily - 7x weekly Seated Levator Scapulae Stretch - 3 reps - 1 sets - 10 hold - 1x daily - 7x weekly Seated Cat Camel - 10 reps - 3 sets - 1x daily - 7x weekly Standing Shoulder Horizontal Abduction - Palms Down - 10 reps - 3 sets - 1x daily - 7x weekly Standing 'L' Stretch at [employer] - 2 reps - 1 sets - 30 hold - 2x daily - 7x weekly  Patient Education: Low Back Pain Handout

## 2018-10-02 ENCOUNTER — Ambulatory Visit (INDEPENDENT_AMBULATORY_CARE_PROVIDER_SITE_OTHER): Payer: Medicare Other | Admitting: Licensed Clinical Social Worker

## 2018-10-02 ENCOUNTER — Encounter (HOSPITAL_COMMUNITY): Payer: Self-pay

## 2018-10-02 DIAGNOSIS — F411 Generalized anxiety disorder: Secondary | ICD-10-CM

## 2018-10-02 DIAGNOSIS — F331 Major depressive disorder, recurrent, moderate: Secondary | ICD-10-CM
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
## 2018-10-05 ENCOUNTER — Ambulatory Visit (HOSPITAL_COMMUNITY): Payer: Medicare Other | Admitting: Licensed Clinical Social Worker

## 2018-10-06 ENCOUNTER — Ambulatory Visit: Payer: Medicare Other | Admitting: Physical Therapy

## 2018-10-06 ENCOUNTER — Other Ambulatory Visit: Payer: Self-pay | Admitting: Student in an Organized Health Care Education/Training Program

## 2018-10-06 ENCOUNTER — Other Ambulatory Visit: Payer: Self-pay

## 2018-10-06 ENCOUNTER — Encounter: Payer: Self-pay | Admitting: Physical Therapy

## 2018-10-06 ENCOUNTER — Other Ambulatory Visit: Payer: Self-pay | Admitting: Pulmonary Disease

## 2018-10-06 DIAGNOSIS — M797 Fibromyalgia: Secondary | ICD-10-CM

## 2018-10-06 DIAGNOSIS — R2689 Other abnormalities of gait and mobility: Secondary | ICD-10-CM | POA: Diagnosis not present

## 2018-10-06 DIAGNOSIS — R2681 Unsteadiness on feet: Secondary | ICD-10-CM | POA: Diagnosis not present

## 2018-10-06 DIAGNOSIS — M542 Cervicalgia: Secondary | ICD-10-CM | POA: Diagnosis not present

## 2018-10-06 DIAGNOSIS — M6281 Muscle weakness (generalized): Secondary | ICD-10-CM | POA: Diagnosis not present

## 2018-10-06 DIAGNOSIS — G8929 Other chronic pain: Secondary | ICD-10-CM | POA: Diagnosis not present

## 2018-10-06 DIAGNOSIS — R293 Abnormal posture: Secondary | ICD-10-CM

## 2018-10-06 DIAGNOSIS — M545 Low back pain: Secondary | ICD-10-CM | POA: Diagnosis not present

## 2018-10-06 NOTE — Therapy (Signed)
[facility] [address] [place], [place], [address] Phone: [phone]   Fax:  [phone]  Physical Therapy Treatment  Patient Details  Name: Lindsay [name] MRN: [id number] Date of Birth: 10-Jul-1970 Referring Provider (PT): Gilles Chiquito, MD   Encounter Date: 10/06/2018   CLINIC OPERATION CHANGES: [facility] is open at lower capacity following universal masking, social distancing, and patient screening.  The patient's COVID risk of complications score is 3.   PT End of Session - 10/06/18 0844    Visit Number  12    Number of Visits  25    Date for PT Re-Evaluation  11/06/18    Authorization Type  [employer]HC Medicare & Medicaid    PT Start Time  0752    PT Stop Time  0844    PT Time Calculation (min)  52 min    Equipment Utilized During Treatment  Oxygen   arrived without her oxygen on & PT had her put on her oxygen   Activity Tolerance  Patient tolerated treatment well    Behavior During Therapy  WFL for tasks assessed/performed       Past Medical History:  Diagnosis Date  . Acid reflux   . Anemia    Iron Def  . Anorexia   . Chronic kidney disease    Nephrotic syndrome  . Colon polyp 2009  . Depression with anxiety   . Edema leg   . Fibromyalgia   . Hemorrhoids   . Hidradenitis suppurativa   . Hypertension   . IBS (irritable bowel syndrome)   . Low back pain   . Migraines   . Morbidly obese ([place])   . Neuromuscular disorder (HCC)    fibromyalgia  . Neuropathy   . Panic attacks   . Polyp of vocal cord or larynx   . Tonsil pain     Past Surgical History:  Procedure Laterality Date  . AXILLARY HIDRADENITIS EXCISION    . COLONOSCOPY WITH PROPOFOL N/A 05/25/2015   Procedure: COLONOSCOPY WITH PROPOFOL;  Surgeon: Milus Banister, MD;  Location: [facility];  Service: Endoscopy;  Laterality: N/A;  . HEMORRHOID SURGERY     with Hidradenitis surgery   . INGUINAL HIDRADENITIS EXCISION    .  TONSILLECTOMY  10/18/2010   by Dr. Wilburn Cornelia  . UPPER GASTROINTESTINAL ENDOSCOPY      There were no vitals filed for this visit.  Subjective Assessment - 10/06/18 0753    Subjective  Her left  knee flared up on Saturday (3 days ago) with normal walking. She spoke to [employer] on Thursday and they still don't have bariatric rollator.  She has been doing exercises from last session and they seem to help.  She has not verified that she has [employer].    Pertinent History  Chronic Kidney disease, Fibromyalgia, HTN, IBS, LBP, neuropathy,    Patient Stated Goals  To improve walking, move legs better. tolerating pain.    Currently in Pain?  Yes    Pain Score  9    in last week, worst 10/10, since Saturday best 9/10   Pain Location  Knee    Pain Orientation  Left    Pain Descriptors / Indicators  Sharp;Throbbing;Aching    Pain Type  Chronic pain    Pain Onset  More than a month ago    Pain Frequency  Constant    Aggravating Factors   standing & walking    Pain Relieving Factors  nothing  Pain Score  6   In last week, worst 9/10, best 6/10   Pain Location  Back    Pain Orientation  Lower;Right;Left    Pain Descriptors / Indicators  Aching;Throbbing    Pain Type  Chronic pain    Pain Onset  More than a month ago    Pain Frequency  Constant    Aggravating Factors   sitting too long    Pain Relieving Factors  stretch                       [facility] Adult PT Treatment/Exercise - 10/06/18 0800      Ambulation/Gait   Ambulation/Gait  Yes    Ambulation/Gait Assistance  5: Supervision    Ambulation/Gait Assistance Details  Oxygen saturation 94% HR 104 after gait without AD    Ambulation Distance (Feet)  100 Feet   100' X 2   Assistive device  None;Other (Comment)   carrying O2 tank   Gait Comments  --      High Level Balance   High Level Balance Comments  --      Lumbar Exercises: Stretches   Passive Hamstring Stretch  Right;Left;30 seconds;1 rep    Passive Hamstring  Stretch Limitations  seated with LE extended & towel to add gastroc stretch for combo    Single Knee to Chest Stretch  Right;Left   verbal cues as HEP   Lower Trunk Rotation  30 seconds   verbal cues as HEP   Gastroc Stretch  --      Lumbar Exercises: Aerobic   Nustep  5 minutes 3 sets with PT providing PT education during rest breaks & instructing in equipment while exercising  Oxygen saturation rate 93-96%     Lumbar Exercises: Standing   Other Standing Lumbar Exercises  --             PT Education - 10/06/18 0844    Education Details  Breaking up exercises by location (bed, chair & standing)  With neck, back, hip & knee pain issues, stretching only one area may not help another. But don't want to over work, so break up during day.  If goes to [facility], then can cut back those days on HEPs unless feels need to stretch.  At [facility], PT recommends recumbent machine that uses both arms & legs to operate starting with sets and SLOWLY building up.  Aquatic exercises may also help. If class she can reduce reps or skip exercises until she builds up tolerance.    Person(s) Educated  Patient    Methods  Explanation;Demonstration;Tactile cues;Verbal cues;Handout    Comprehension  Verbalized understanding;Need further instruction       PT Short Term Goals - 10/06/18 1140      PT SHORT TERM GOAL #1   Title  Patient verbalizes & demonstrates understanding of initial HEP. (All STGs Target Date: 09/09/2018)    Baseline  MET 10/06/2018    Time  4    Period  Weeks    Status  Achieved    Target Date  10/09/18      PT SHORT TERM GOAL #2   Title  Patient reports 25% improvement in cervical pain.    Baseline  MET 10/06/2018    Time  4    Period  Weeks    Status  Achieved    Target Date  10/09/18      PT SHORT TERM GOAL #3   Title  Patient reports  25% improvement in lumbar & buttocks / hip pain.    Time  4    Period  Weeks    Status  On-going    Target Date  10/09/18      PT SHORT TERM GOAL #4    Title  Berg Balance Test >/= 43/56    Baseline  not tested, will retest on progress note visit 10    Time  4    Period  Weeks    Status  On-going    Target Date  10/09/18      PT SHORT TERM GOAL #5   Title  Patient ambulates with head turns to scan environment without change in pace or path.    Baseline  MET 10/06/2018    Time  4    Period  Weeks    Status  Achieved      PT SHORT TERM GOAL #6   Title  Gait Velocity >2.5 ft/sec    Time  4    Period  Weeks    Status  On-going    Target Date  10/09/18        PT Long Term Goals - 08/10/18 1824      PT LONG TERM GOAL #1   Title  Patient verbalizes & demonstrates ongoing HEP / fitness plan and exercise program for [employer]. (All LTGs Target Date 11/06/2018)    Time  12    Period  Weeks    Status  New    Target Date  11/06/18      PT LONG TERM GOAL #2   Title  Patient reports cervical pain to </= 5/10 with activities.    Time  12    Period  Weeks    Status  New    Target Date  11/06/18      PT LONG TERM GOAL #3   Title  Patient reports lumbar & buttock / hip pain </= 4/10 with standing & gait activities.    Time  12    Period  Weeks    Status  New    Target Date  11/06/18      PT LONG TERM GOAL #4   Title  Berg Balance Test >/= 47/56 to indicate lower fall risk.    Time  12    Period  Weeks    Status  New    Target Date  11/06/18      PT LONG TERM GOAL #5   Title  Functional Gait Assessment >/= 15/30 to indicate lower fall risk.    Time  12    Period  Weeks    Status  New    Target Date  11/06/18      Additional Long Term Goals   Additional Long Term Goals  Yes      PT LONG TERM GOAL #6   Title  Gait Velocity >2.62 ft/sec to indicate fuller community mobility.    Time  12    Period  Weeks    Status  New    Target Date  11/06/18            Plan - 10/06/18 1141    Clinical Impression Statement  PT consolidated HEP but due to multiple areas of pain the HEP has 6 seated, 2 standing & 4 supine. She  appears to understand PT recommendation to break exercises by location & perform ea. location at different times per day.  Patient met 3 STGs checked today.    Comorbidities  Chronic Kidney disease, Fibromyalgia, HTN, IBS, LBP, neuropathy,    Rehab Potential  Good    PT Frequency  2x / week    PT Treatment/Interventions  ADLs/Self Care Home Management;Aquatic Therapy;Cryotherapy;Electrical Stimulation;Moist Heat;Traction;Ultrasound;DME Instruction;Gait training;Stair training;Functional mobility training;Therapeutic activities;Therapeutic exercise;Balance training;Neuromuscular re-education;Patient/family education;Manual techniques;Passive range of motion;Dry needling;Vestibular;Spinal Manipulations;Joint Manipulations    PT Next Visit Plan  she has not received her rollator yet.  check remaining 3 STGs.    PT Home Exercise Plan  Access Code: [email or web address]    Consulted and Agree with Plan of Care  Patient       Patient will benefit from skilled therapeutic intervention in order to improve the following deficits and impairments:     Visit Diagnosis: Muscle weakness (generalized)  Unsteadiness on feet  Other abnormalities of gait and mobility  Chronic midline low back pain without sciatica  Cervicalgia  Abnormal posture     Problem List Patient Active Problem List   Diagnosis Date Noted  . Vision blurring 09/17/2018  . Fall 06/30/2018  . Frequent urination at night 04/23/2018  . Chronic cough 03/04/2018  . Dental infection 12/05/2017  . Right arm pain 12/05/2017  . Chronic respiratory failure with hypoxia ([place])   . Low serum vitamin B12 05/02/2017  . Palpitations 04/11/2017  . Nutritional anemia 06/17/2016  . Allergic rhinitis 06/12/2016  . Vitamin D deficiency, unspecified 06/12/2016  . Nicotine dependence 06/12/2016  . Menorrhagia 01/08/2016  . Migraines 01/08/2016  . Bilateral leg pain 11/29/2015  . Hemorrhoids 07/05/2015  . Fibromyalgia 07/05/2015  . Chronic leg pain  09/26/2014  . Healthcare maintenance 09/26/2014  . Morbid obesity ([place]) 07/04/2014  . Major depressive disorder, recurrent episode, moderate ([place]) 10/13/2012  . Generalized anxiety disorder 10/13/2012  . Chronic nausea 05/10/2010  . IBS (irritable bowel syndrome) 06/16/2007  . Essential hypertension 02/06/2006  . HIDRADENITIS 11/18/2005    Kariem Wolfson PT, DPT 10/06/2018, 11:45 AM  [facility] [address] [place], [place], [address] Phone: [phone]   Fax:  [phone]  Name: Lindsay [name] MRN: [id number] Date of Birth: 1970-11-16

## 2018-10-06 NOTE — Patient Instructions (Signed)
Access Code: [email or web address]  URL: [email or web address]  Date: 10/06/2018  Prepared by: Jamey Reas   Exercises Seated Cervical Traction - 3 reps - 1 sets - 10 hold - 1x daily - 7x weekly Seated Assisted Cervical Rotation with Towel - 3 reps - 1 sets - 10 hold - 1x daily - 7x weekly Seated Upper Trapezius Stretch - 2 reps - 1 sets - 30 hold - 2x daily - 7x weekly Seated Levator Scapulae Stretch - 3 reps - 1 sets - 10 hold - 1x daily - 7x weekly Seated Cat Camel - 10 reps - 1 sets - 1x daily - 7x weekly Seated Combined Hamstring / Calf Stretch with cane - 2 reps - 1 sets - 30 seconds hold - 1x daily - 7x weekly Standing Shoulder Horizontal Abduction - Palms Down - 10 reps - 1 sets - 1x daily - 7x weekly Standing 'L' Stretch at [employer] - 2 reps - 1 sets - 30 hold - 2x daily - 7x weekly Hooklying Single Knee to Chest Stretch - 2 reps - 1 sets - 30 seconds hold - 1x daily - 7x weekly Supine Lower Trunk Rotation - 2 reps - 1 sets - 30 seconds hold - 1x daily - 7x weekly Supine Hip External Rotation Stretch - 2 reps - 1 sets - 30 seconds hold - 1x daily - 7x weekly Supine Bilateral Hip Internal Rotation Stretch - 2 reps - 1 sets - 30 seconds hold - 1x daily - 7x weekly

## 2018-10-07 ENCOUNTER — Ambulatory Visit: Payer: Medicare Other | Admitting: Physical Therapy

## 2018-10-07 DIAGNOSIS — R2689 Other abnormalities of gait and mobility: Secondary | ICD-10-CM | POA: Diagnosis not present

## 2018-10-07 DIAGNOSIS — M542 Cervicalgia: Secondary | ICD-10-CM | POA: Diagnosis not present

## 2018-10-07 DIAGNOSIS — G8929 Other chronic pain: Secondary | ICD-10-CM

## 2018-10-07 DIAGNOSIS — M6281 Muscle weakness (generalized): Secondary | ICD-10-CM

## 2018-10-07 DIAGNOSIS — R293 Abnormal posture: Secondary | ICD-10-CM

## 2018-10-07 DIAGNOSIS — R2681 Unsteadiness on feet: Secondary | ICD-10-CM | POA: Diagnosis not present

## 2018-10-07 DIAGNOSIS — M545 Low back pain: Secondary | ICD-10-CM | POA: Diagnosis not present

## 2018-10-07 NOTE — Therapy (Signed)
[facility] [address] [place], [place], [address] Phone: [phone]   Fax:  [phone]  Physical Therapy Treatment  Patient Details  Name: Lindsay [name] MRN: [id number] Date of Birth: August 30, 1970 Referring Provider (PT): Gilles Chiquito, MD   Encounter Date: 10/07/2018  PT End of Session - 10/07/18 0844    Visit Number  13    Number of Visits  25    Date for PT Re-Evaluation  11/06/18    Authorization Type  UHC Medicare & Medicaid    PT Start Time  0800    PT Stop Time  0840    PT Time Calculation (min)  40 min    Equipment Utilized During Treatment  Oxygen   arrived without her oxygen on & PT had her put on her oxygen   Activity Tolerance  Patient tolerated treatment well    Behavior During Therapy  [facility] for tasks assessed/performed       Past Medical History:  Diagnosis Date  . Acid reflux   . Anemia    Iron Def  . Anorexia   . Chronic kidney disease    Nephrotic syndrome  . Colon polyp 2009  . Depression with anxiety   . Edema leg   . Fibromyalgia   . Hemorrhoids   . Hidradenitis suppurativa   . Hypertension   . IBS (irritable bowel syndrome)   . Low back pain   . Migraines   . Morbidly obese ([place])   . Neuromuscular disorder (HCC)    fibromyalgia  . Neuropathy   . Panic attacks   . Polyp of vocal cord or larynx   . Tonsil pain     Past Surgical History:  Procedure Laterality Date  . AXILLARY HIDRADENITIS EXCISION    . COLONOSCOPY WITH PROPOFOL N/A 05/25/2015   Procedure: COLONOSCOPY WITH PROPOFOL;  Surgeon: Milus Banister, MD;  Location: [facility];  Service: Endoscopy;  Laterality: N/A;  . HEMORRHOID SURGERY     with Hidradenitis surgery   . INGUINAL HIDRADENITIS EXCISION    . TONSILLECTOMY  10/18/2010   by Dr. Wilburn Cornelia  . UPPER GASTROINTESTINAL ENDOSCOPY      There were no vitals filed for this visit.      [facility] PT Assessment - 10/07/18 0001      Assessment   Medical  Diagnosis  fall & chronic pain    Referring Provider (PT)  Gilles Chiquito, MD      Ambulation/Gait   Ambulation/Gait  Yes    Ambulation/Gait Assistance  5: Supervision    Ambulation Distance (Feet)  230 Feet    Gait velocity  2.5 ft/sec      Berg Balance Test   Sit to Stand  Able to stand without using hands and stabilize independently    Standing Unsupported  Able to stand safely 2 minutes    Sitting with Back Unsupported but Feet Supported on Floor or Stool  Able to sit safely and securely 2 minutes    Stand to Sit  Sits safely with minimal use of hands    Transfers  Able to transfer safely, minor use of hands    Standing Unsupported with Eyes Closed  Able to stand 10 seconds safely    Standing Unsupported with Feet Together  Able to place feet together independently and stand 1 minute safely    From Standing, Reach Forward with Outstretched Arm  Can reach forward >12 cm safely (5")    From Standing Position, Pick up  Object from Floor  Able to pick up shoe safely and easily    From Standing Position, Turn to Look Behind Over each Shoulder  Looks behind one side only/other side shows less weight shift    Turn 360 Degrees  Able to turn 360 degrees safely in 4 seconds or less    Standing Unsupported, Alternately Place Feet on Step/Stool  Able to stand independently and safely and complete 8 steps in 20 seconds    Standing Unsupported, One Foot in Front  Able to plae foot ahead of the other independently and hold 30 seconds    Standing on One Leg  Tries to lift leg/unable to hold 3 seconds but remains standing independently    Total Score  50                   [facility] Adult PT Treatment/Exercise - 10/07/18 0001      Lumbar Exercises: Stretches   Passive Hamstring Stretch  Right;Left;2 reps;30 seconds    Piriformis Stretch  Right;Left;2 reps;30 seconds    Piriformis Stretch Limitations  seated    Gastroc Stretch  Right;Left;2 reps;30 seconds    Gastroc Stretch Limitations  off  edge of step      Lumbar Exercises: Aerobic   Nustep  75mn L5 UE/LE      Lumbar Exercises: Standing   Other Standing Lumbar Exercises  up down stairs X 5 times with bilat UE support               PT Short Term Goals - 10/07/18 0846      PT SHORT TERM GOAL #1   Title  Patient verbalizes & demonstrates understanding of initial HEP. (All STGs Target Date: 09/09/2018)    Baseline  MET 10/06/2018    Time  4    Period  Weeks    Status  Achieved    Target Date  10/09/18      PT SHORT TERM GOAL #2   Title  Patient reports 25% improvement in cervical pain.    Baseline  MET 10/06/2018    Time  4    Period  Weeks    Status  Achieved    Target Date  10/09/18      PT SHORT TERM GOAL #3   Title  Patient reports 25% improvement in lumbar & buttocks / hip pain.    Time  4    Period  Weeks    Status  On-going    Target Date  10/09/18      PT SHORT TERM GOAL #4   Title  Berg Balance Test >/= 43/56    Baseline  50 on 9/9    Time  4    Period  Weeks    Status  Achieved    Target Date  10/09/18      PT SHORT TERM GOAL #5   Title  Patient ambulates with head turns to scan environment without change in pace or path.    Baseline  MET 10/06/2018    Time  4    Period  Weeks    Status  Achieved      PT SHORT TERM GOAL #6   Title  Gait Velocity >2.5 ft/sec    Baseline  2.5 ft/sec on 9/9    Time  4    Period  Weeks    Status  Achieved    Target Date  10/09/18        PT Long Term Goals -  08/10/18 1824      PT LONG TERM GOAL #1   Title  Patient verbalizes & demonstrates ongoing HEP / fitness plan and exercise program for [employer]. (All LTGs Target Date 11/06/2018)    Time  12    Period  Weeks    Status  New    Target Date  11/06/18      PT LONG TERM GOAL #2   Title  Patient reports cervical pain to </= 5/10 with activities.    Time  12    Period  Weeks    Status  New    Target Date  11/06/18      PT LONG TERM GOAL #3   Title  Patient reports lumbar & buttock / hip pain </=  4/10 with standing & gait activities.    Time  12    Period  Weeks    Status  New    Target Date  11/06/18      PT LONG TERM GOAL #4   Title  Berg Balance Test >/= 47/56 to indicate lower fall risk.    Time  12    Period  Weeks    Status  New    Target Date  11/06/18      PT LONG TERM GOAL #5   Title  Functional Gait Assessment >/= 15/30 to indicate lower fall risk.    Time  12    Period  Weeks    Status  New    Target Date  11/06/18      Additional Long Term Goals   Additional Long Term Goals  Yes      PT LONG TERM GOAL #6   Title  Gait Velocity >2.62 ft/sec to indicate fuller community mobility.    Time  12    Period  Weeks    Status  New    Target Date  11/06/18            Plan - 10/07/18 0844    Clinical Impression Statement  She expresses intrest in joinin [employer] and this was highly encouraged for her, she will need print out of what equipment to use once [facility] transitions [facility] to [facility] [employer]. The rest of her STG were tested today and she has met all STG except for back/leg pain goal. She showed big improvements in balance and gait speed however still lacks endurance and is limited by leg pain. She will get spinal injection soon she reports    Comorbidities  Chronic Kidney disease, Fibromyalgia, HTN, IBS, LBP, neuropathy,    Rehab Potential  Good    PT Frequency  2x / week    PT Treatment/Interventions  ADLs/Self Care Home Management;Aquatic Therapy;Cryotherapy;Electrical Stimulation;Moist Heat;Traction;Ultrasound;DME Instruction;Gait training;Stair training;Functional mobility training;Therapeutic activities;Therapeutic exercise;Balance training;Neuromuscular re-education;Patient/family education;Manual techniques;Passive range of motion;Dry needling;Vestibular;Spinal Manipulations;Joint Manipulations    PT Next Visit Plan  she has not received her rollator yet.  check remaining 3 STGs.    PT Home Exercise Plan  Access Code: [email or web address]    Consulted and Agree with Plan of  Care  Patient       Patient will benefit from skilled therapeutic intervention in order to improve the following deficits and impairments:     Visit Diagnosis: Muscle weakness (generalized)  Unsteadiness on feet  Other abnormalities of gait and mobility  Chronic midline low back pain without sciatica  Cervicalgia  Abnormal posture     Problem List Patient Active Problem List   Diagnosis Date Noted  .  Vision blurring 09/17/2018  . Fall 06/30/2018  . Frequent urination at night 04/23/2018  . Chronic cough 03/04/2018  . Dental infection 12/05/2017  . Right arm pain 12/05/2017  . Chronic respiratory failure with hypoxia ([place])   . Low serum vitamin B12 05/02/2017  . Palpitations 04/11/2017  . Nutritional anemia 06/17/2016  . Allergic rhinitis 06/12/2016  . Vitamin D deficiency, unspecified 06/12/2016  . Nicotine dependence 06/12/2016  . Menorrhagia 01/08/2016  . Migraines 01/08/2016  . Bilateral leg pain 11/29/2015  . Hemorrhoids 07/05/2015  . Fibromyalgia 07/05/2015  . Chronic leg pain 09/26/2014  . Healthcare maintenance 09/26/2014  . Morbid obesity ([place]) 07/04/2014  . Major depressive disorder, recurrent episode, moderate ([place]) 10/13/2012  . Generalized anxiety disorder 10/13/2012  . Chronic nausea 05/10/2010  . IBS (irritable bowel syndrome) 06/16/2007  . Essential hypertension 02/06/2006  . HIDRADENITIS 11/18/2005    Silvestre Mesi 10/07/2018, 8:56 AM  [facility] [address] [place], [place], [address] Phone: [phone]   Fax:  [phone]  Name: Lindsay [name] MRN: [id number] Date of Birth: Oct 08, 1970

## 2018-10-08 ENCOUNTER — Ambulatory Visit (INDEPENDENT_AMBULATORY_CARE_PROVIDER_SITE_OTHER): Payer: Medicare Other | Admitting: Licensed Clinical Social Worker

## 2018-10-08 ENCOUNTER — Other Ambulatory Visit (HOSPITAL_COMMUNITY): Payer: Self-pay | Admitting: Psychiatry

## 2018-10-08 ENCOUNTER — Encounter: Payer: Self-pay | Admitting: Internal Medicine

## 2018-10-08 DIAGNOSIS — F331 Major depressive disorder, recurrent, moderate: Secondary | ICD-10-CM | POA: Diagnosis not present

## 2018-10-08 DIAGNOSIS — F411 Generalized anxiety disorder: Secondary | ICD-10-CM
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
## 2018-10-10 ENCOUNTER — Other Ambulatory Visit: Payer: Self-pay | Admitting: Internal Medicine

## 2018-10-10 DIAGNOSIS — M79604 Pain in right leg: Secondary | ICD-10-CM

## 2018-10-11 ENCOUNTER — Emergency Department (HOSPITAL_COMMUNITY)
Admission: EM | Admit: 2018-10-11 | Discharge: 2018-10-11 | Disposition: A | Discharge status: Home / Self Care | Payer: Medicare Other | Attending: Emergency Medicine | Admitting: Emergency Medicine

## 2018-10-11 ENCOUNTER — Encounter (HOSPITAL_COMMUNITY): Payer: Self-pay

## 2018-10-11 ENCOUNTER — Other Ambulatory Visit: Payer: Self-pay

## 2018-10-11 ENCOUNTER — Emergency Department (HOSPITAL_COMMUNITY): Payer: Medicare Other

## 2018-10-11 DIAGNOSIS — R300 Dysuria: Secondary | ICD-10-CM | POA: Diagnosis not present

## 2018-10-11 DIAGNOSIS — S0990XA Unspecified injury of head, initial encounter: Secondary | ICD-10-CM

## 2018-10-11 DIAGNOSIS — F17210 Nicotine dependence, cigarettes, uncomplicated: Secondary | ICD-10-CM | POA: Diagnosis not present

## 2018-10-11 DIAGNOSIS — M542 Cervicalgia: Secondary | ICD-10-CM | POA: Diagnosis not present

## 2018-10-11 DIAGNOSIS — R51 Headache: Secondary | ICD-10-CM | POA: Diagnosis not present

## 2018-10-11 DIAGNOSIS — Z79899 Other long term (current) drug therapy: Secondary | ICD-10-CM | POA: Insufficient documentation

## 2018-10-11 DIAGNOSIS — S199XXA Unspecified injury of neck, initial encounter: Secondary | ICD-10-CM | POA: Diagnosis not present

## 2018-10-11 DIAGNOSIS — I129 Hypertensive chronic kidney disease with stage 1 through stage 4 chronic kidney disease, or unspecified chronic kidney disease: Secondary | ICD-10-CM | POA: Insufficient documentation

## 2018-10-11 DIAGNOSIS — Y92003 Bedroom of unspecified non-institutional (private) residence as the place of occurrence of the external cause: Secondary | ICD-10-CM | POA: Diagnosis not present

## 2018-10-11 DIAGNOSIS — S6992XA Unspecified injury of left wrist, hand and finger(s), initial encounter: Secondary | ICD-10-CM | POA: Diagnosis not present

## 2018-10-11 DIAGNOSIS — N189 Chronic kidney disease, unspecified: Secondary | ICD-10-CM | POA: Diagnosis not present

## 2018-10-11 DIAGNOSIS — R109 Unspecified abdominal pain: Secondary | ICD-10-CM | POA: Diagnosis not present

## 2018-10-11 DIAGNOSIS — R519 Headache, unspecified: Secondary | ICD-10-CM

## 2018-10-11 DIAGNOSIS — Y999 Unspecified external cause status: Secondary | ICD-10-CM | POA: Insufficient documentation

## 2018-10-11 DIAGNOSIS — Y939 Activity, unspecified: Secondary | ICD-10-CM | POA: Insufficient documentation

## 2018-10-11 DIAGNOSIS — W06XXXA Fall from bed, initial encounter: Secondary | ICD-10-CM | POA: Diagnosis not present

## 2018-10-11 DIAGNOSIS — M79642 Pain in left hand: Secondary | ICD-10-CM | POA: Diagnosis not present

## 2018-10-11 LAB — CBC WITH DIFFERENTIAL/PLATELET
Abs Immature Granulocytes: 0.02 10*3/uL (ref 0.00–0.07)
Basophils Absolute: 0.1 10*3/uL (ref 0.0–0.1)
Basophils Relative: 1 %
Eosinophils Absolute: 0.2 10*3/uL (ref 0.0–0.5)
Eosinophils Relative: 3 %
HCT: 51.3 % — ABNORMAL HIGH (ref 36.0–46.0)
Hemoglobin: 15.4 g/dL — ABNORMAL HIGH (ref 12.0–15.0)
Immature Granulocytes: 0 %
Lymphocytes Relative: 36 %
Lymphs Abs: 2.8 10*3/uL (ref 0.7–4.0)
MCH: 25.3 pg — ABNORMAL LOW (ref 26.0–34.0)
MCHC: 30 g/dL (ref 30.0–36.0)
MCV: 84.4 fL (ref 80.0–100.0)
Monocytes Absolute: 0.9 10*3/uL (ref 0.1–1.0)
Monocytes Relative: 11 %
Neutro Abs: 3.9 10*3/uL (ref 1.7–7.7)
Neutrophils Relative %: 49 %
Platelets: 386 10*3/uL (ref 150–400)
RBC: 6.08 MIL/uL — ABNORMAL HIGH (ref 3.87–5.11)
RDW: 19.1 % — ABNORMAL HIGH (ref 11.5–15.5)
WBC: 7.9 10*3/uL (ref 4.0–10.5)
nRBC: 0 % (ref 0.0–0.2)

## 2018-10-11 LAB — COMPREHENSIVE METABOLIC PANEL
ALT: 13 U/L (ref 0–44)
AST: 12 U/L — ABNORMAL LOW (ref 15–41)
Albumin: 4.3 g/dL (ref 3.5–5.0)
Alkaline Phosphatase: 79 U/L (ref 38–126)
Anion gap: 9 (ref 5–15)
BUN: 8 mg/dL (ref 6–20)
CO2: 27 mmol/L (ref 22–32)
Calcium: 8.7 mg/dL — ABNORMAL LOW (ref 8.9–10.3)
Chloride: 104 mmol/L (ref 98–111)
Creatinine, Ser: 0.67 mg/dL (ref 0.44–1.00)
GFR calc Af Amer: >60 mL/min (ref >60)
GFR calc non Af Amer: >60 mL/min (ref >60)
Glucose, Bld: 85 mg/dL (ref 70–99)
Potassium: 4 mmol/L (ref 3.5–5.1)
Sodium: 140 mmol/L (ref 135–145)
Total Bilirubin: 0.3 mg/dL (ref 0.3–1.2)
Total Protein: 8.3 g/dL — ABNORMAL HIGH (ref 6.5–8.1)

## 2018-10-11 LAB — URINALYSIS, ROUTINE W REFLEX MICROSCOPIC
Bilirubin Urine: NEGATIVE
Glucose, UA: NEGATIVE mg/dL
Hgb urine dipstick: NEGATIVE
Ketones, ur: NEGATIVE mg/dL
Leukocytes,Ua: NEGATIVE
Nitrite: NEGATIVE
Protein, ur: NEGATIVE mg/dL
Specific Gravity, Urine: 1.01 (ref 1.005–1.030)
pH: 6 (ref 5.0–8.0)

## 2018-10-11 LAB — POC URINE PREG, ED: Preg Test, Ur: NEGATIVE

## 2018-10-11 IMAGING — CT CT RENAL STONE PROTOCOL
2 of 4 series · 17 of 46 positions shown, 19 images · non-contrast
Comparison: [DATE] CT

CLINICAL DATA: 47-year-old female with acute RIGHT flank and
abdominal pain for 1 week.

EXAM:
CT ABDOMEN AND PELVIS WITHOUT CONTRAST
TECHNIQUE: Multidetector CT imaging of the abdomen and pelvis was performed
following the standard protocol without IV contrast.

[Series 2: axial st · axial · 0.87mm/px · z∈[-599,-169]mm · 14 of 98 slices shown, 16 images]
[im 6/98  soft-tissue]
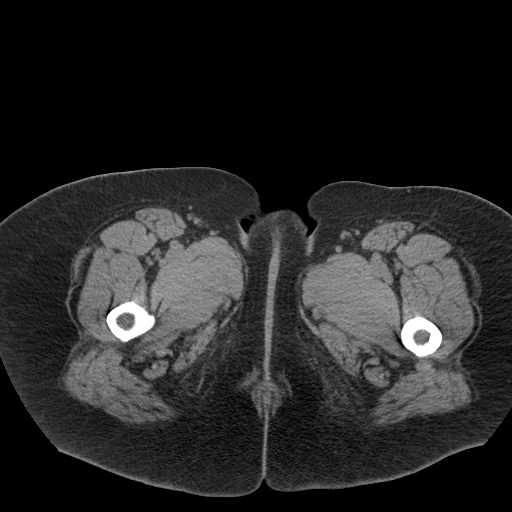
[im 6/98  bone]
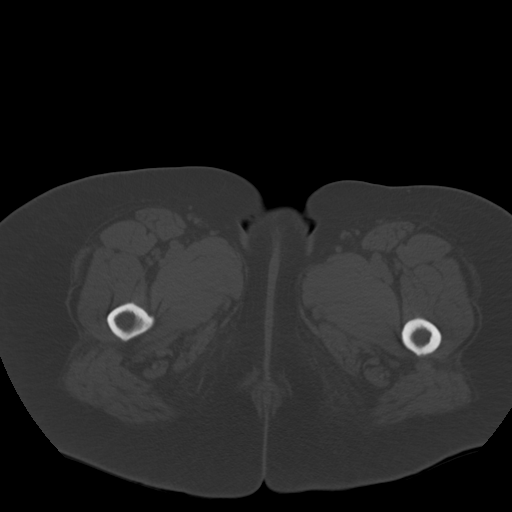
[im 12/98  soft-tissue]
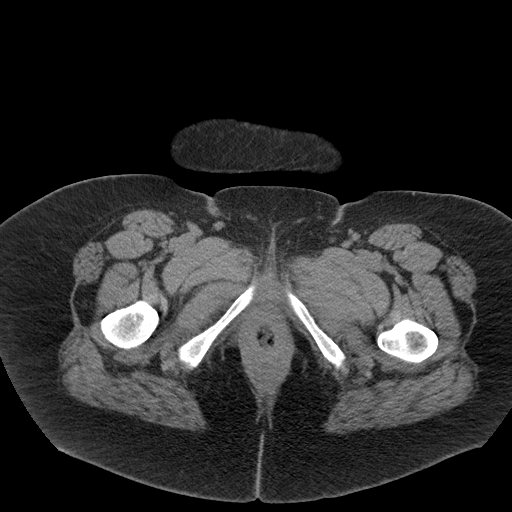
[im 18/98  soft-tissue]
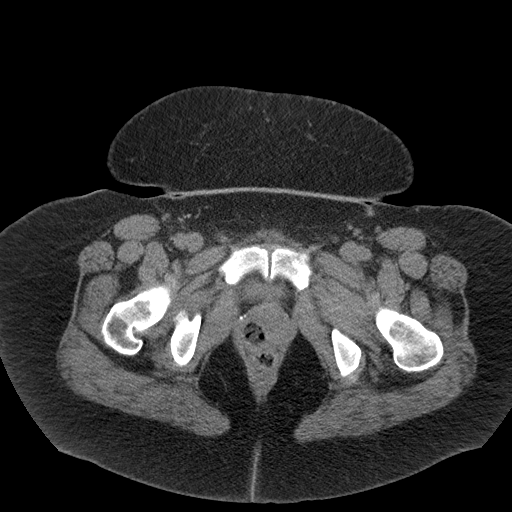
[im 29/98  soft-tissue]
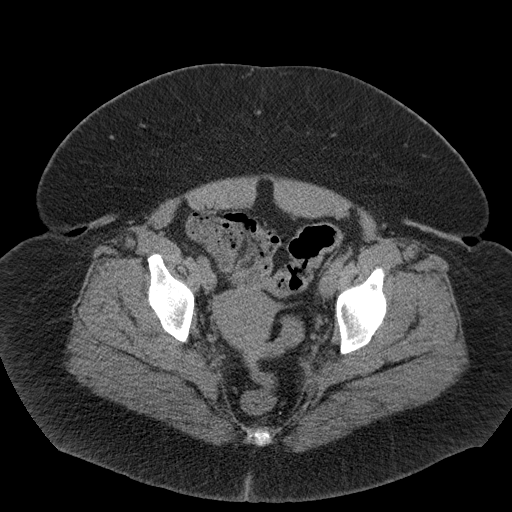
[im 35/98  soft-tissue]
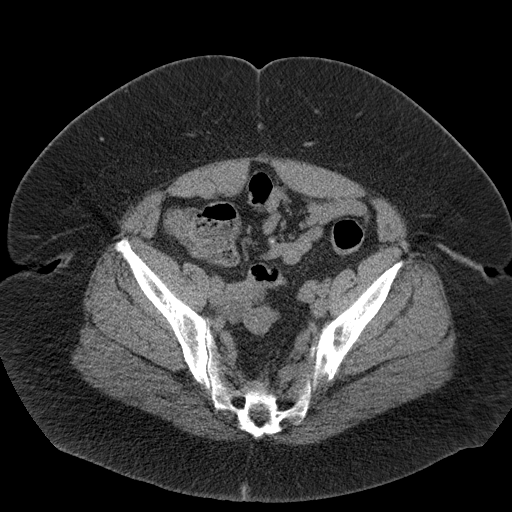
[im 40/98  soft-tissue]
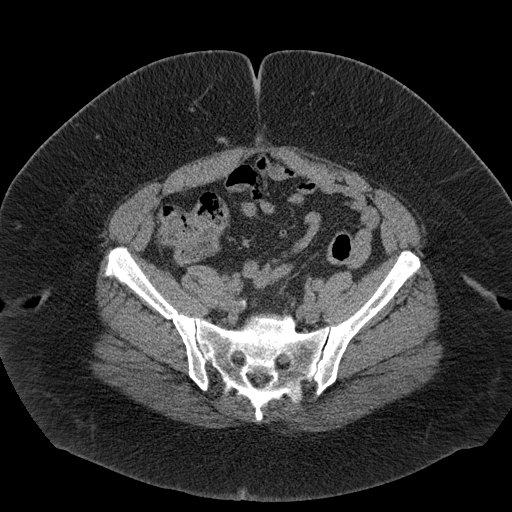
[im 46/98  soft-tissue]
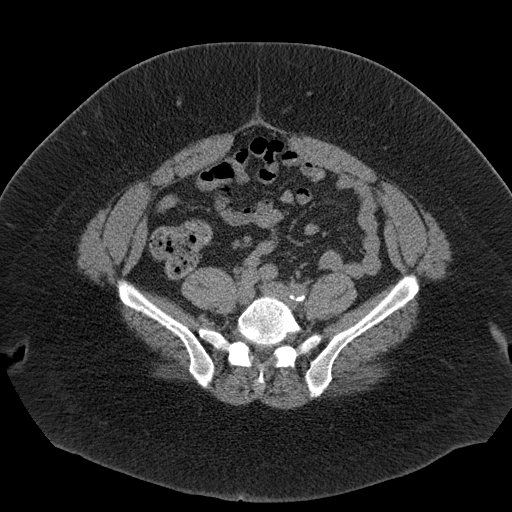
[im 52/98  soft-tissue]
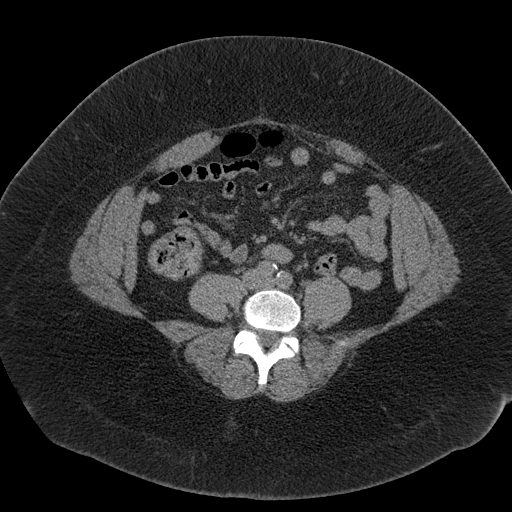
[im 58/98  soft-tissue]
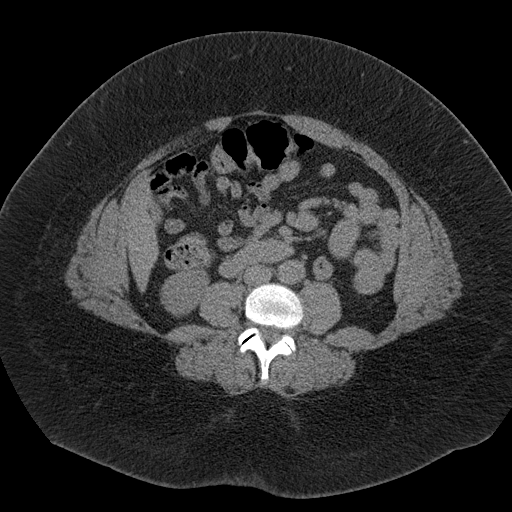
[im 58/98  bone]
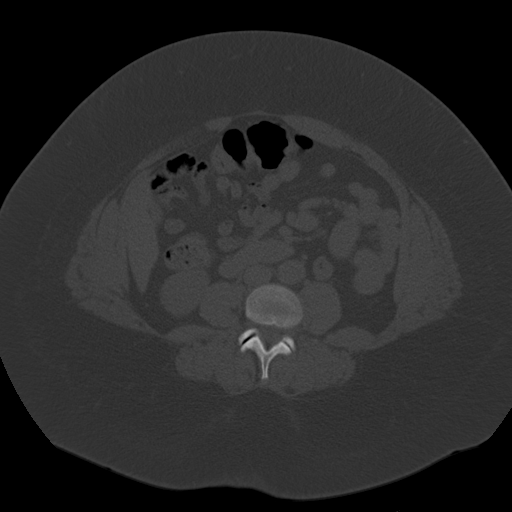
[im 63/98  soft-tissue]
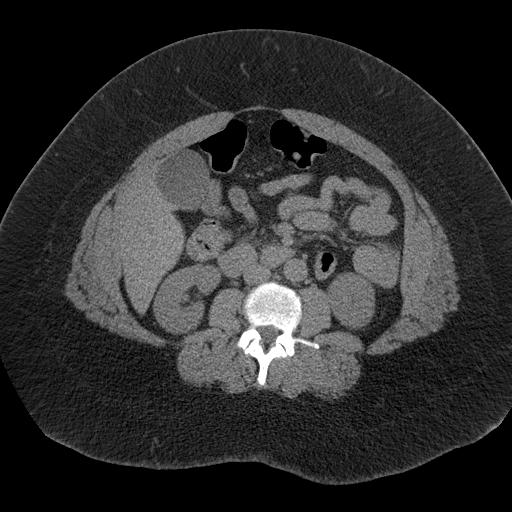
[im 75/98  soft-tissue]
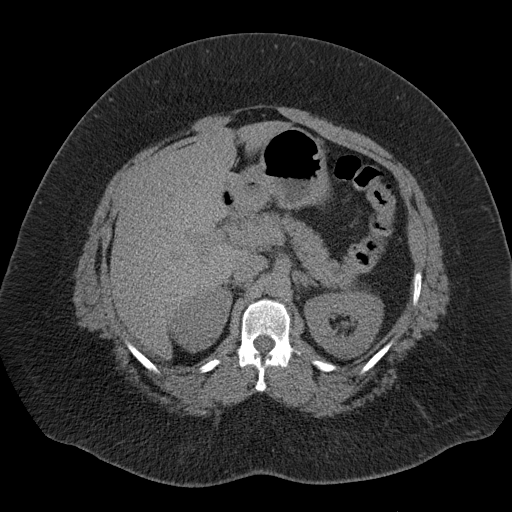
[im 80/98  soft-tissue]
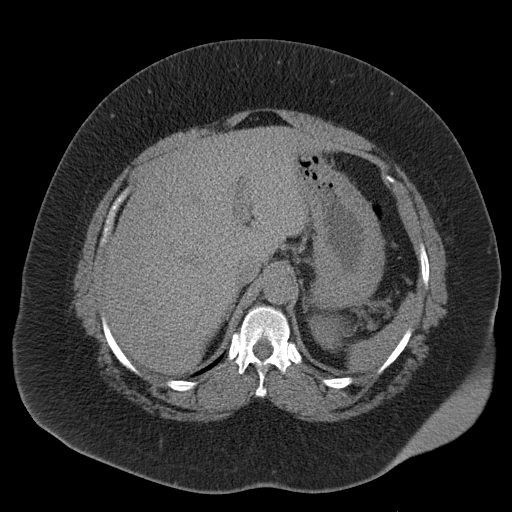
[im 86/98  soft-tissue]
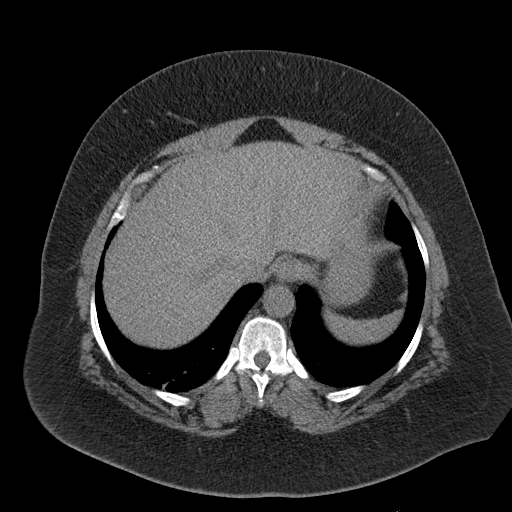
[im 92/98  soft-tissue]
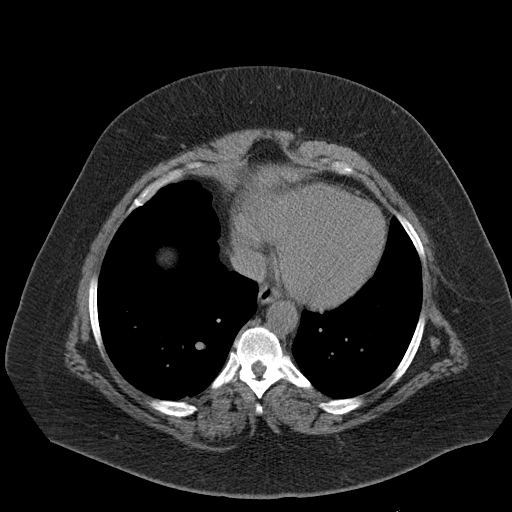

[Series 4: coronal · coronal · 0.96mm/px · 3 of 180 slices shown]
[im 60/180  soft-tissue]
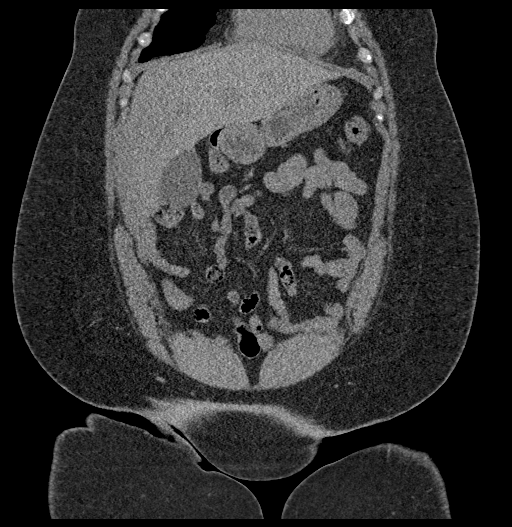
[im 80/180  soft-tissue]
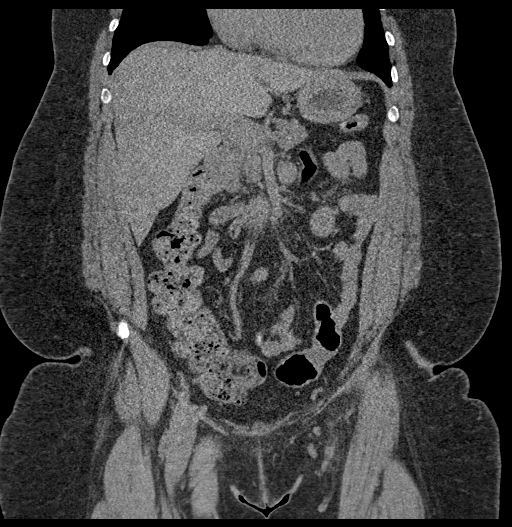
[im 100/180  soft-tissue]
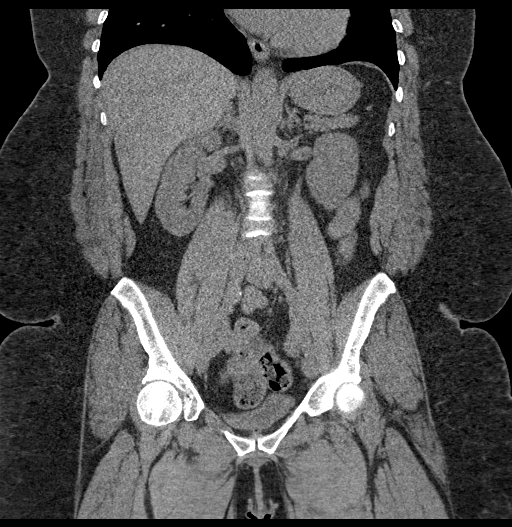

[17 of 46 positions shown; findings below may reference images not displayed]

FINDINGS: Please note that parenchymal abnormalities may be missed without
intravenous contrast.

Lower chest: No acute abnormality.

Hepatobiliary: The liver and gallbladder are unremarkable. No
biliary dilatation.

Pancreas: Unremarkable

Spleen: Unremarkable

Adrenals/Urinary Tract: The kidneys, adrenal glands and bladder are
unremarkable. No hydronephrosis or urinary calculi identified.

Stomach/Bowel: Stomach is within normal limits. Appendix appears
normal. No evidence of bowel wall thickening, distention, or
inflammatory changes.

Vascular/Lymphatic: Mild aortic atherosclerosis. No enlarged
abdominal or pelvic lymph nodes.

Reproductive: Uterus and bilateral adnexa are unremarkable.

Other: No ascites, pneumoperitoneum or focal collection.

Musculoskeletal: No acute or suspicious bony abnormalities.
IMPRESSION: 1. No evidence of acute abnormality. No CT findings to suggest a
cause for this patient's abdominal pain.
2.  Aortic Atherosclerosis ([VW]-[VW]).

## 2018-10-11 IMAGING — CT CT HEAD W/O CM
3 series · 14 of 47 positions shown, 16 images · non-contrast
Comparison: [DATE] CT

CLINICAL DATA: 47-year-old female with acute head and neck pain
following fall 2 days ago. Initial encounter.

EXAM:
CT HEAD WITHOUT CONTRAST
CT CERVICAL SPINE WITHOUT CONTRAST
TECHNIQUE: Multidetector CT imaging of the head and cervical spine was
performed following the standard protocol without intravenous
contrast. Multiplanar CT image reconstructions of the cervical spine
were also generated.

[Series 3: head wo · axial · 0.47mm/px · z∈[-138,-8]mm · 8 of 32 slices shown, 10 images]
[im 3/32  brain]
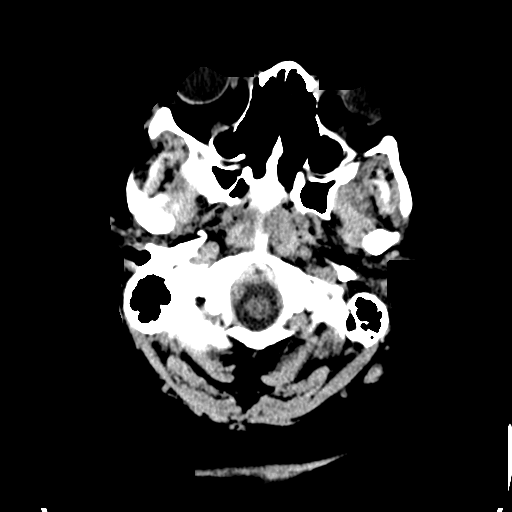
[im 3/32  bone]
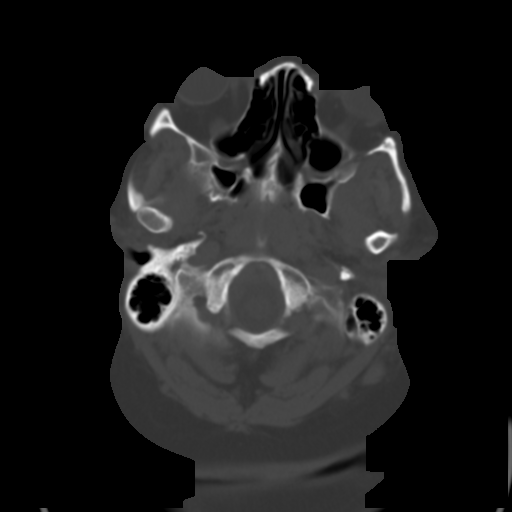
[im 7/32  brain]
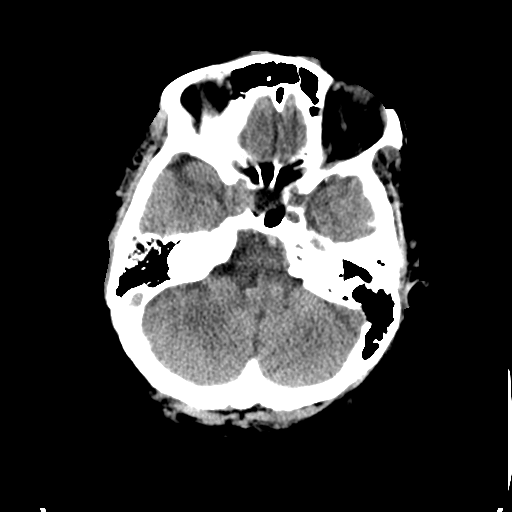
[im 10/32  brain]
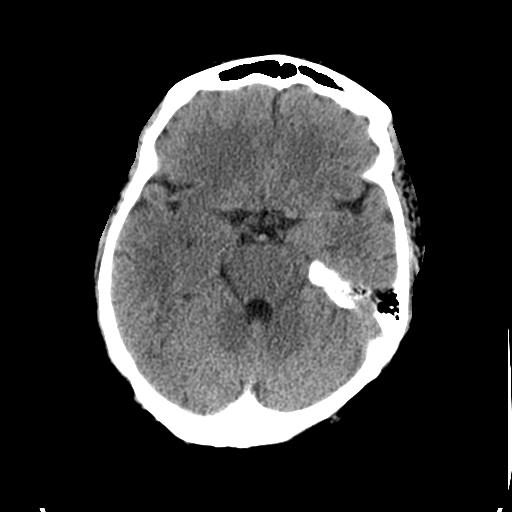
[im 14/32  brain]
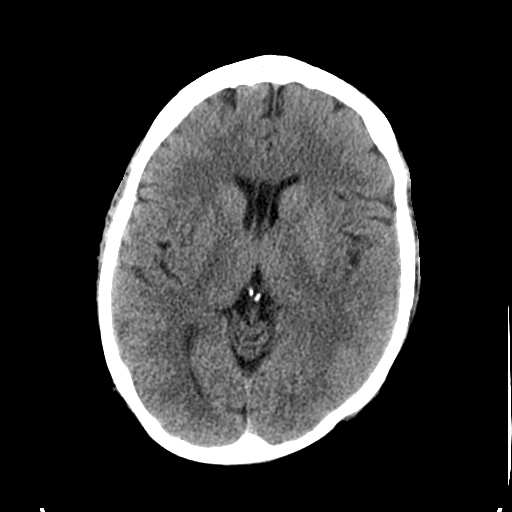
[im 18/32  brain]
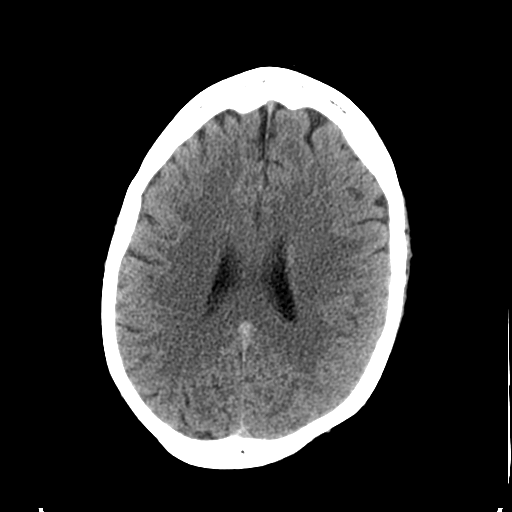
[im 18/32  bone]
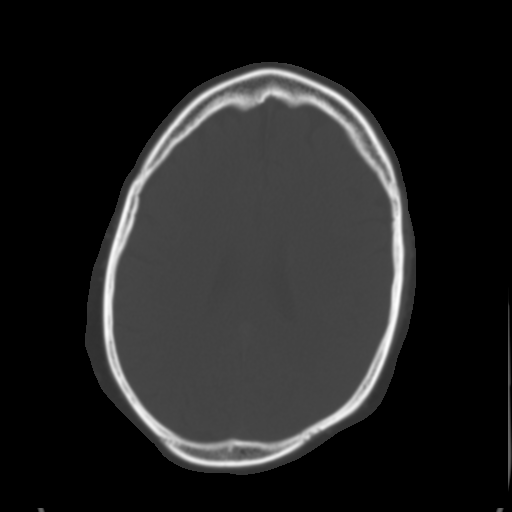
[im 22/32  brain]
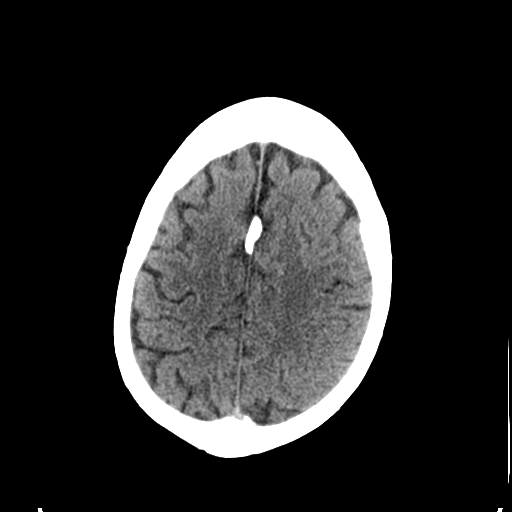
[im 25/32  brain]
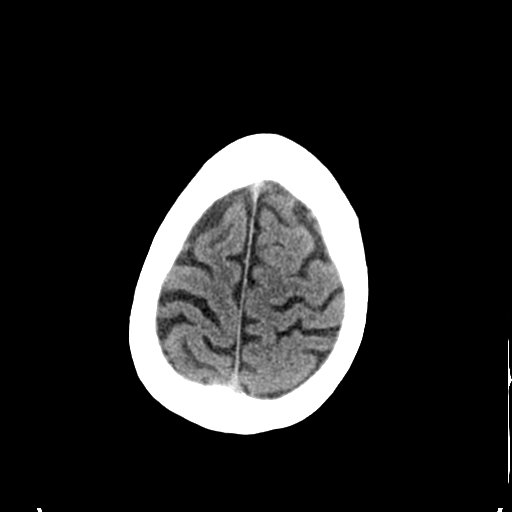
[im 29/32  brain]
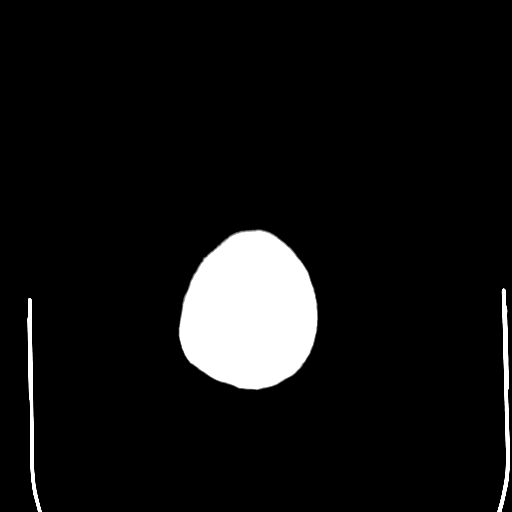

[Series 5: coronal soft tissue · coronal · 0.30mm/px · 3 of 65 slices shown]
[im 22/65  brain]
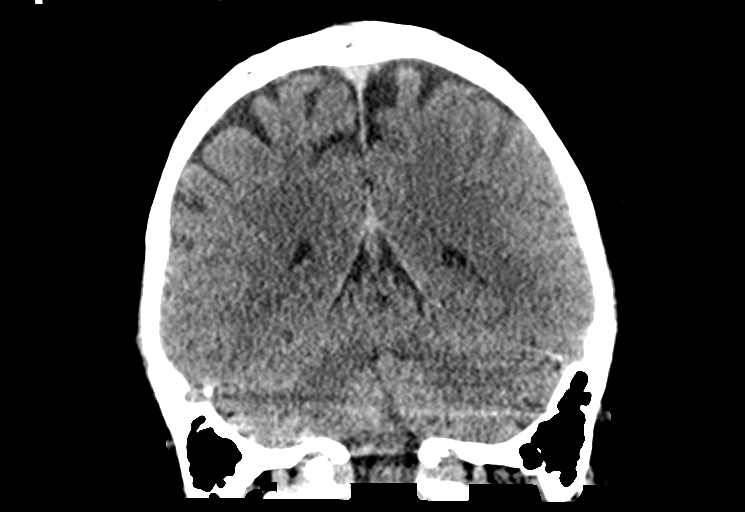
[im 29/65  brain]
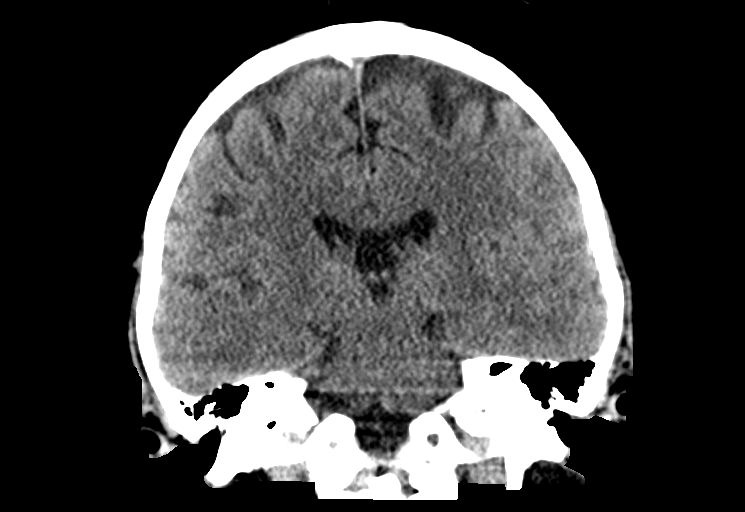
[im 36/65  brain]
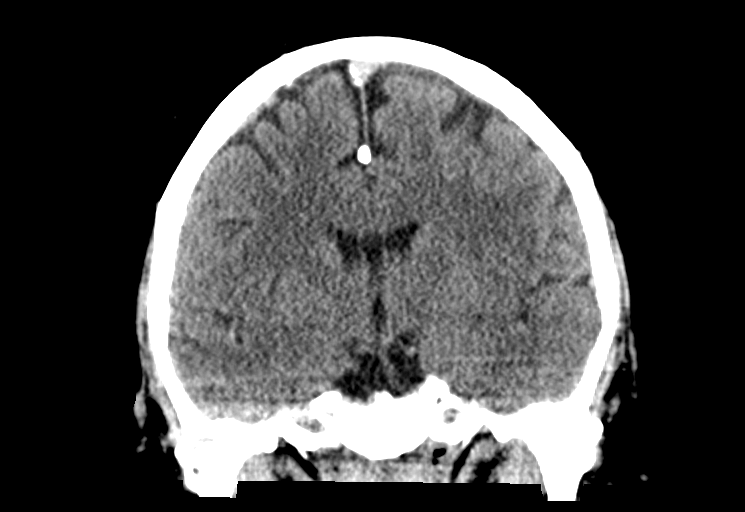

[Series 6: sagittal soft tissue · sagittal · 0.33mm/px · 3 of 52 slices shown]
[im 18/52  brain]
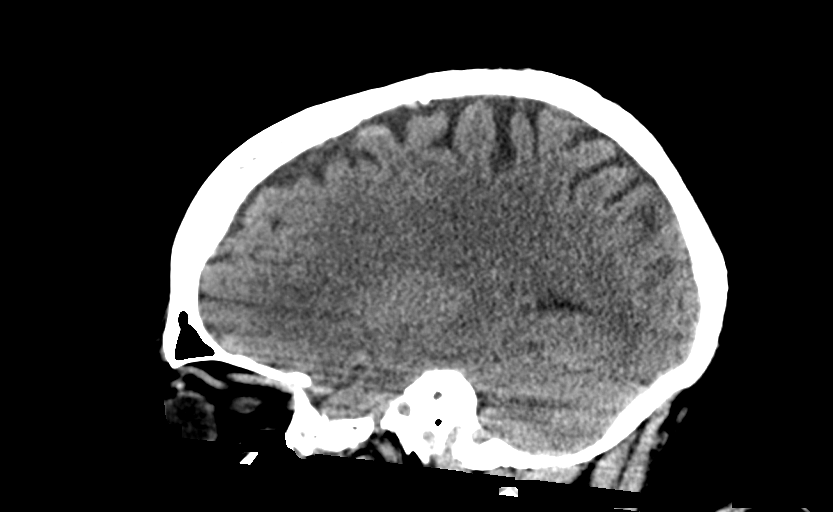
[im 26/52  brain]
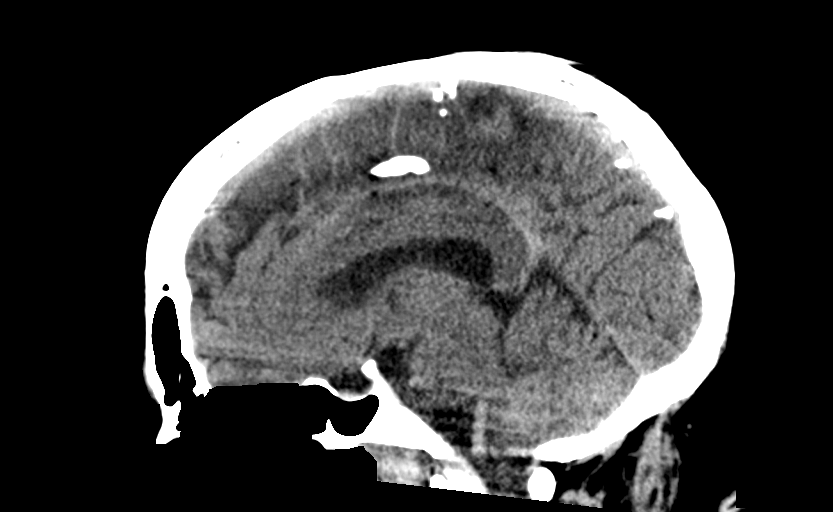
[im 35/52  brain]
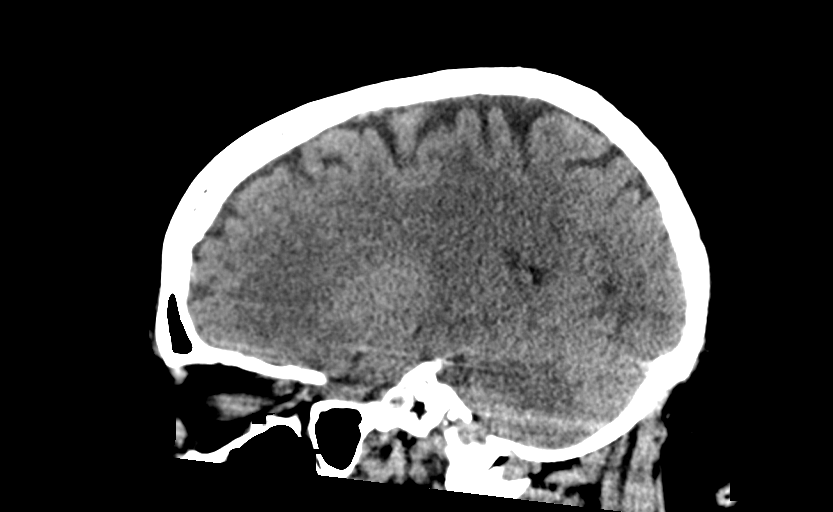

[14 of 47 positions shown; findings below may reference images not displayed]

FINDINGS: CT HEAD FINDINGS

Brain: No evidence of acute infarction, hemorrhage, hydrocephalus,
extra-axial collection or mass lesion/mass effect.

Vascular: No hyperdense vessel or unexpected calcification.

Skull: Normal. Negative for fracture or focal lesion.

Sinuses/Orbits: No acute finding.

Other: None.

CT CERVICAL SPINE FINDINGS

Alignment: Normal.

Skull base and vertebrae: No acute fracture. No primary bone lesion
or focal pathologic process.

Soft tissues and spinal canal: No prevertebral fluid or swelling. No
visible canal hematoma.

Disc levels: Mild to moderate degenerative disc disease at C5-6 and
C6-7 noted.

Upper chest: Negative.

Other: None
IMPRESSION: 1. No evidence of intracranial abnormality.
2. No static evidence of acute injury to the cervical spine. Mild to
moderate degenerative disc disease at C5-6 and C6-7.

## 2018-10-11 IMAGING — CR DG HAND COMPLETE 3+V*L*
3 series · 3 of 3 positions shown · non-contrast
Comparison: None.

CLINICAL DATA: Patient status post fall.  Fifth metacarpal pain.

EXAM:
LEFT HAND - COMPLETE 3+ VIEW

[x hand pa left]
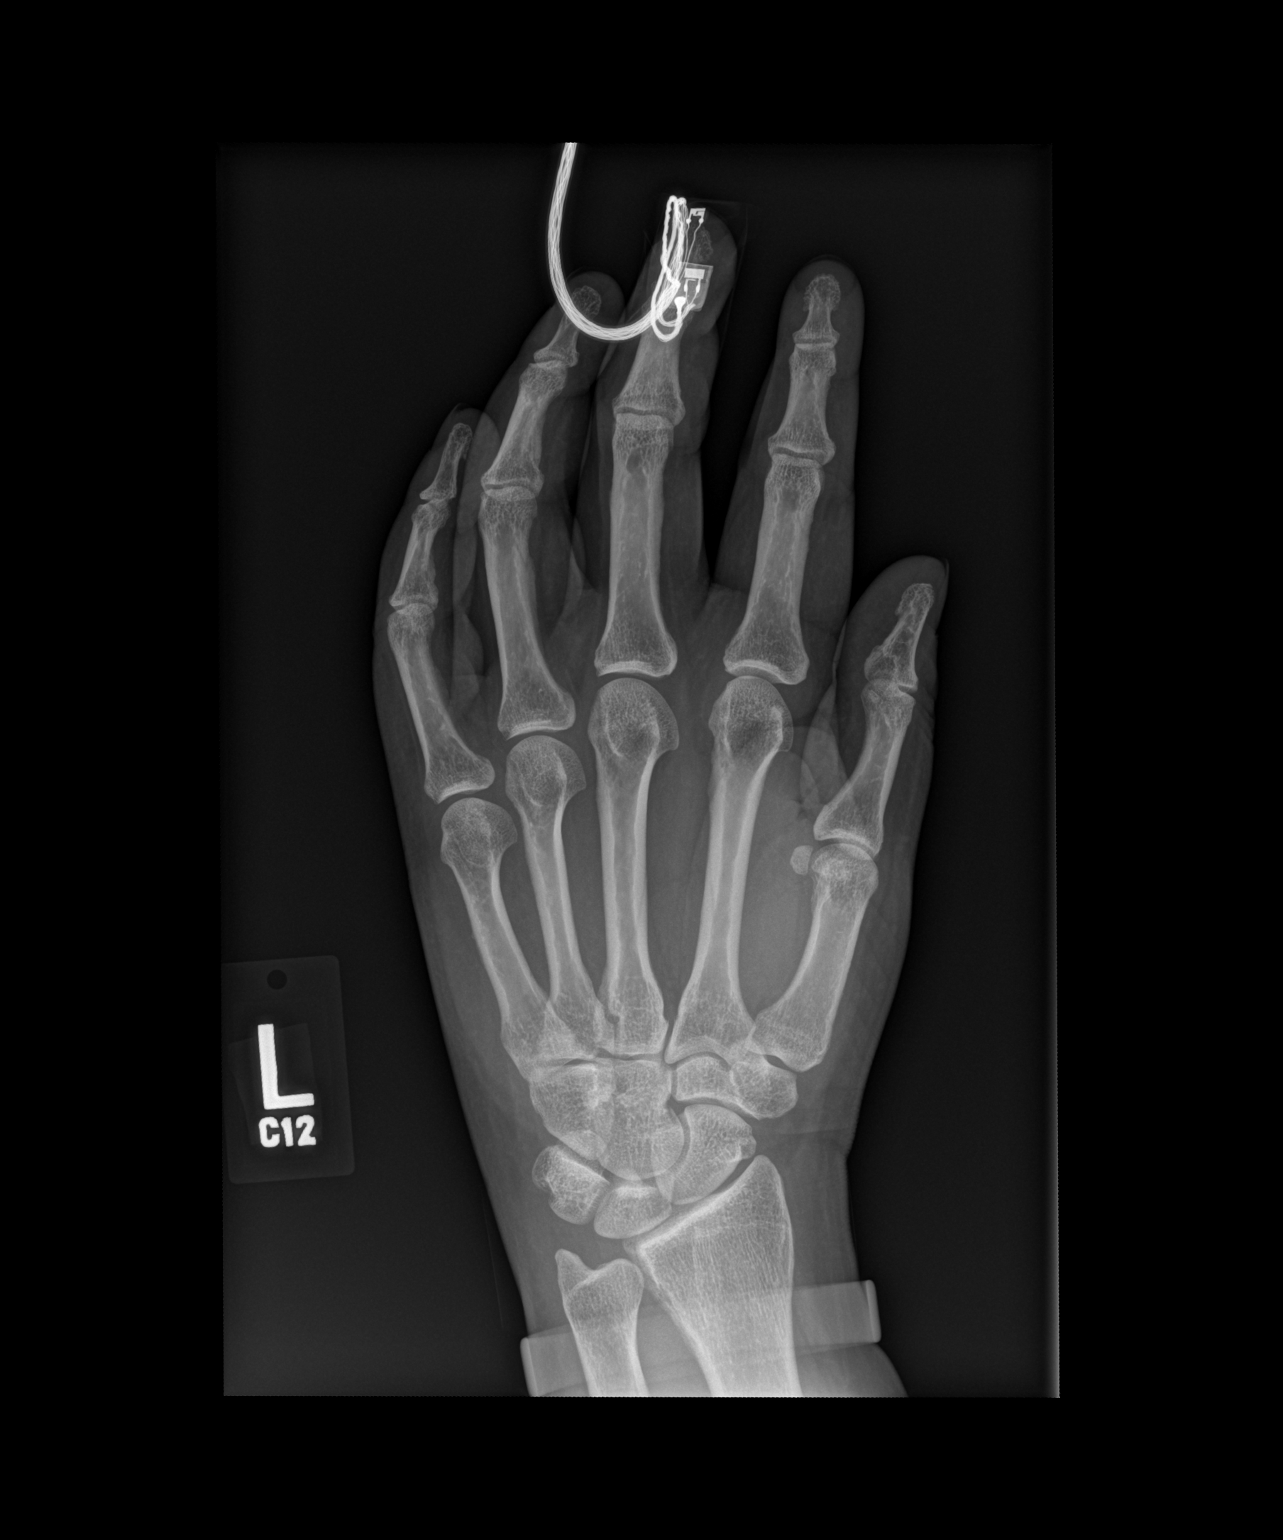

[x hand obl left]
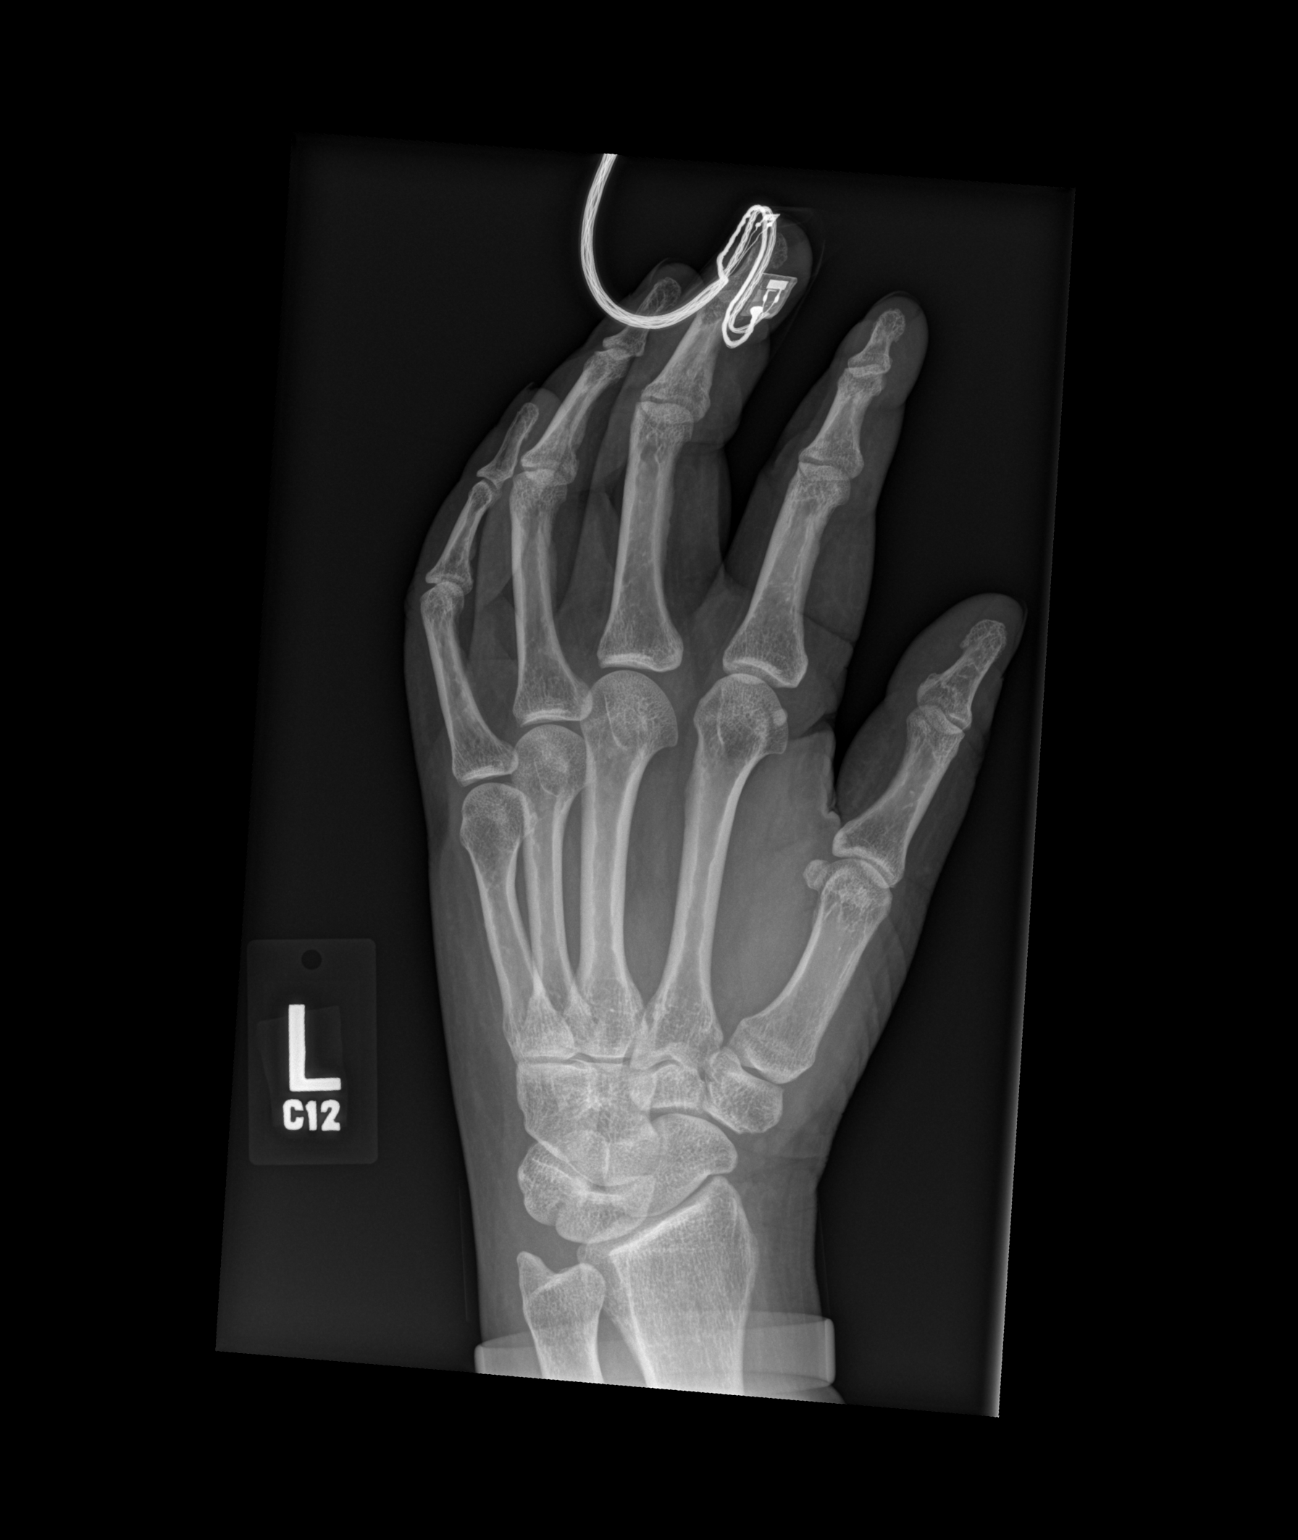

[x hand lat left]
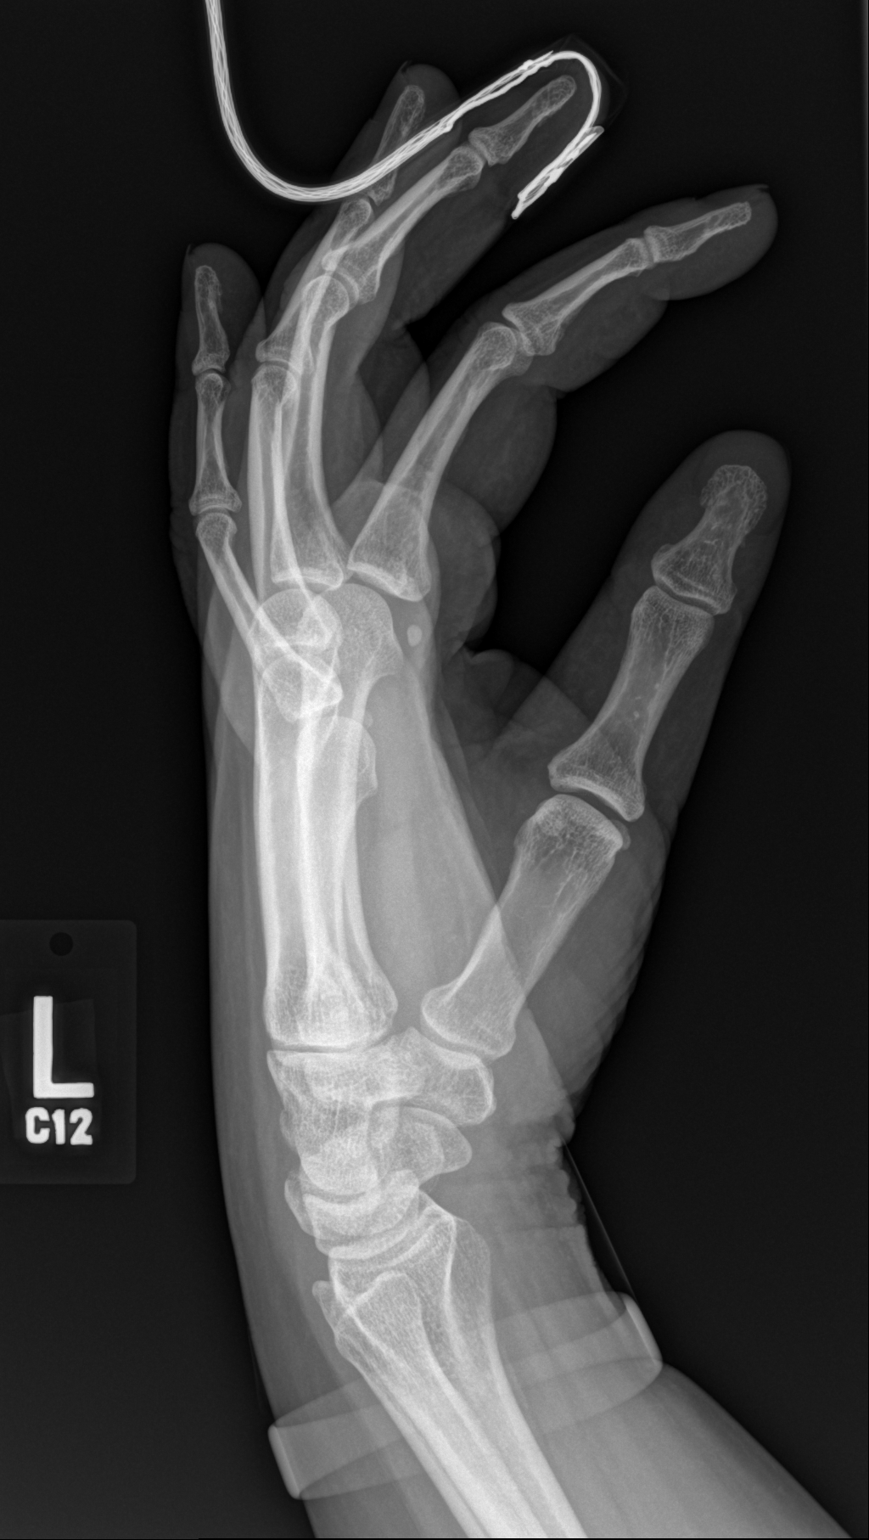

[3 of 3 positions shown; findings below may reference images not displayed]

FINDINGS: There is no evidence of fracture or dislocation. There is no
evidence of arthropathy or other focal bone abnormality. Soft
tissues are unremarkable.
IMPRESSION: No acute osseous abnormality.

## 2018-10-11 IMAGING — CT CT CERVICAL SPINE W/O CM
3 of 4 series · 12 of 33 positions shown, 14 images · non-contrast
Comparison: [DATE] CT

CLINICAL DATA: 47-year-old female with acute head and neck pain
following fall 2 days ago. Initial encounter.

EXAM:
CT HEAD WITHOUT CONTRAST
CT CERVICAL SPINE WITHOUT CONTRAST
TECHNIQUE: Multidetector CT imaging of the head and cervical spine was
performed following the standard protocol without intravenous
contrast. Multiplanar CT image reconstructions of the cervical spine
were also generated.

[Series 5: orthogonal bone · axial · 0.31mm/px · z∈[-280,-184]mm · 4 of 79 slices shown, 5 images]
[im 14/79  soft-tissue]
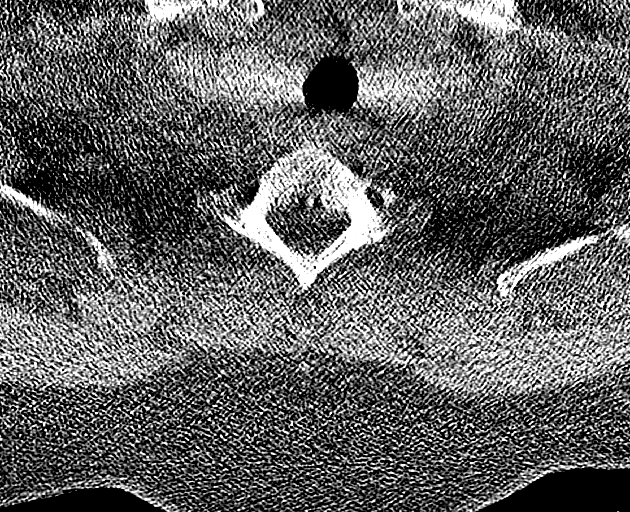
[im 14/79  bone]
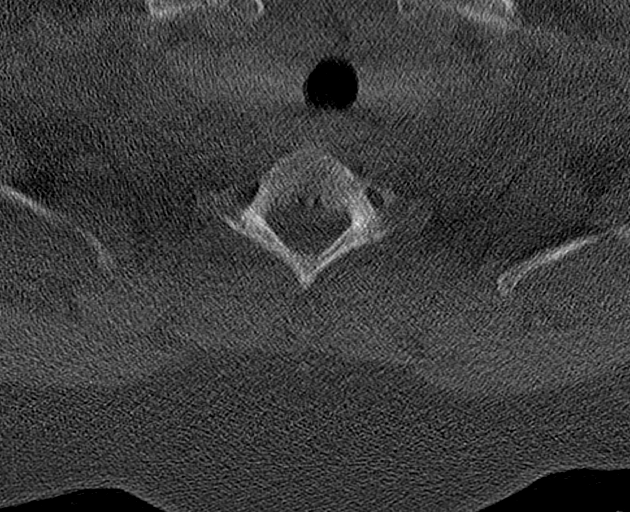
[im 27/79  bone]
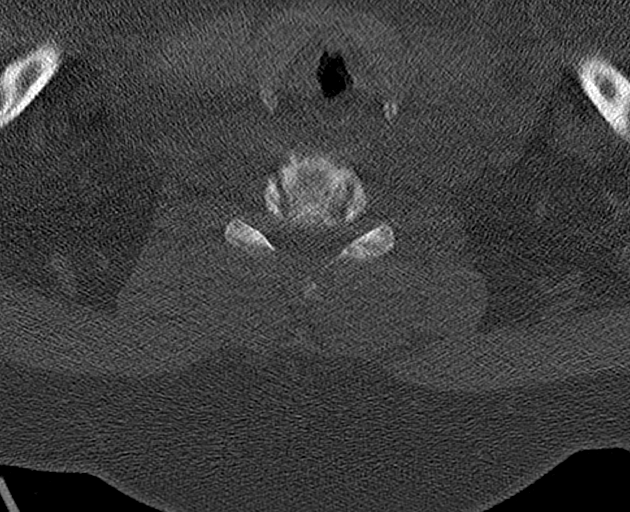
[im 53/79  bone]
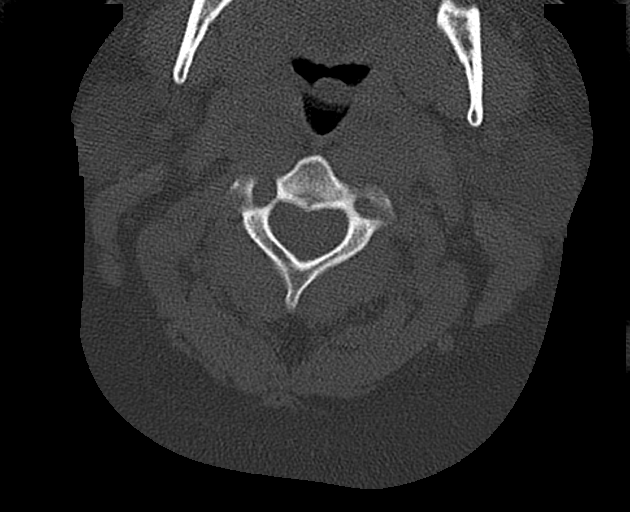
[im 66/79  bone]
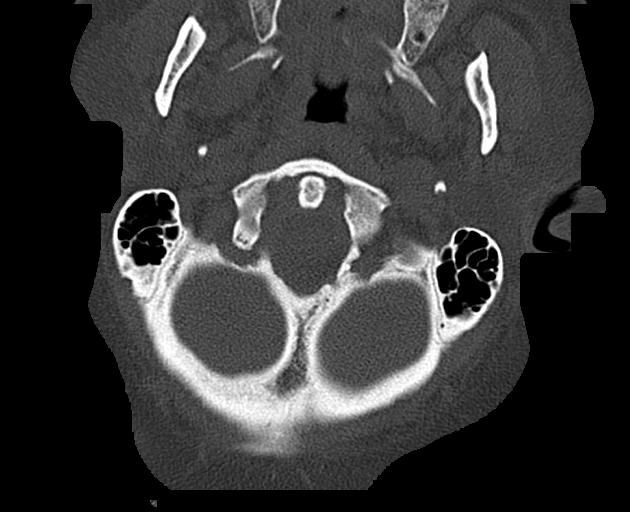

[Series 6: coronal bone · coronal · 0.24mm/px · 3 of 59 slices shown]
[im 12/59  bone]
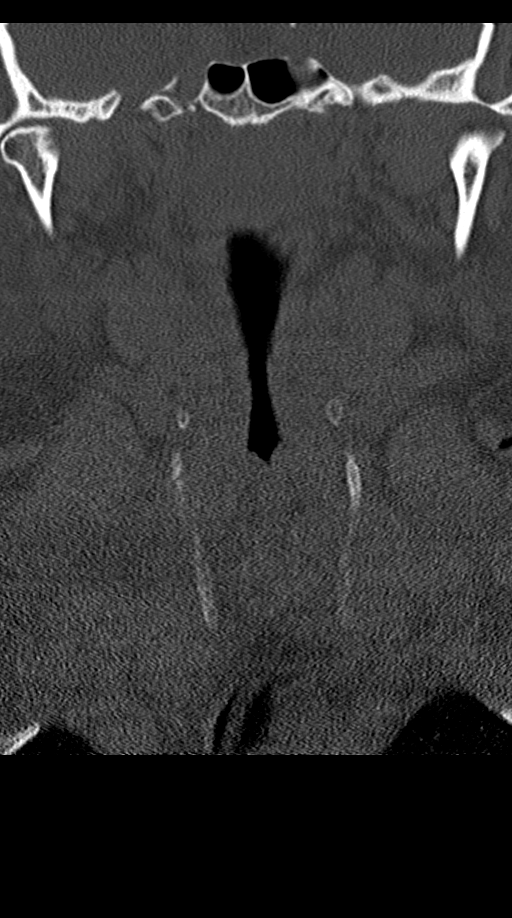
[im 24/59  bone]
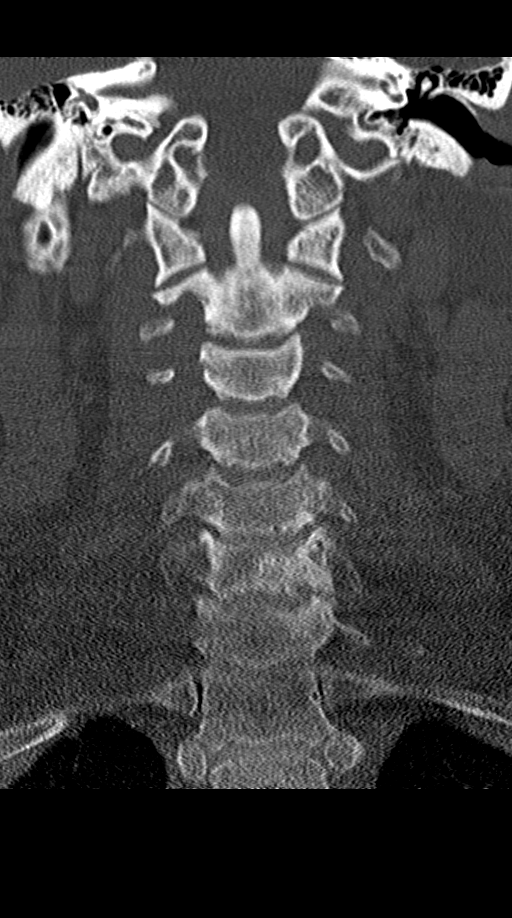
[im 35/59  bone]
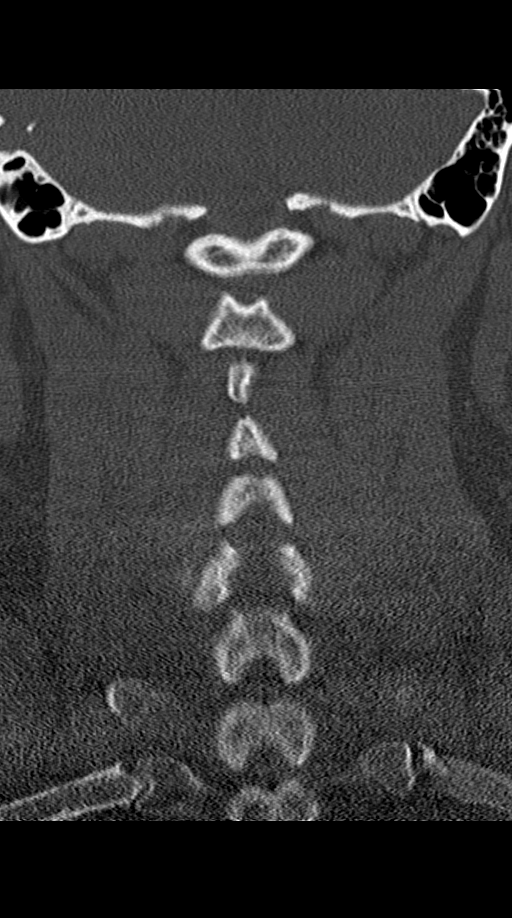

[Series 7: sagittal bone · sagittal · 0.39mm/px · 5 of 58 slices shown, 6 images]
[im 20/58  bone]
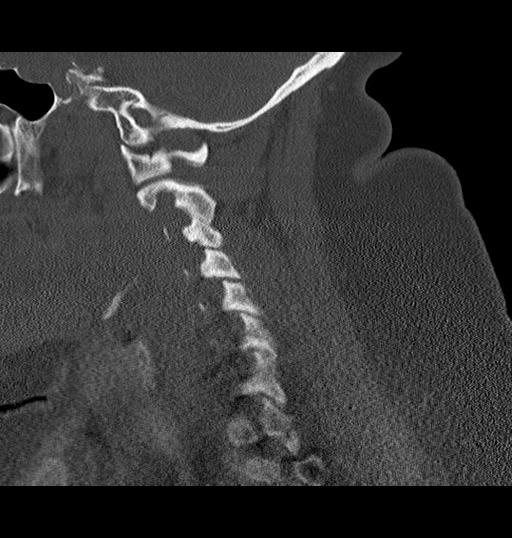
[im 24/58  bone]
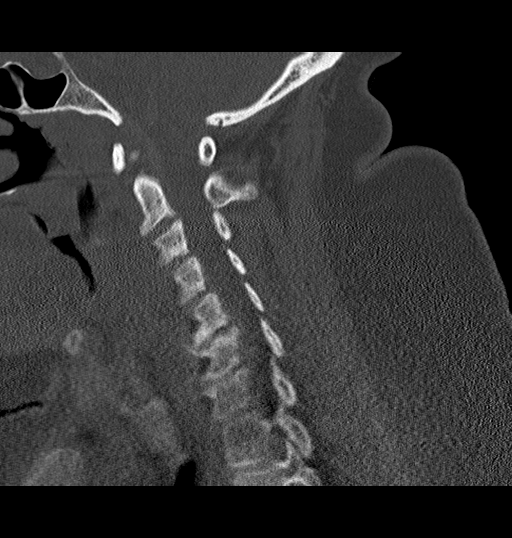
[im 29/58  soft-tissue]
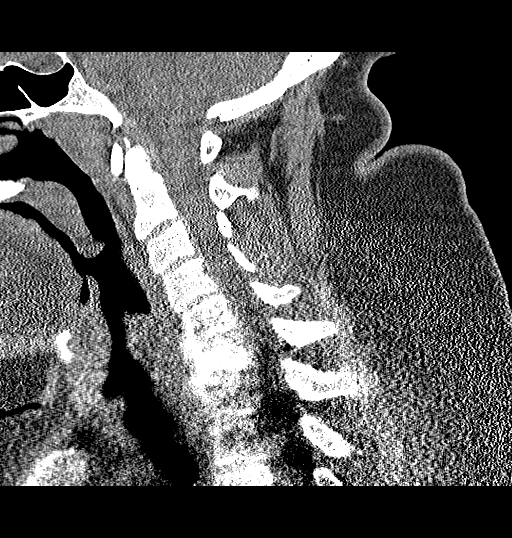
[im 29/58  bone]
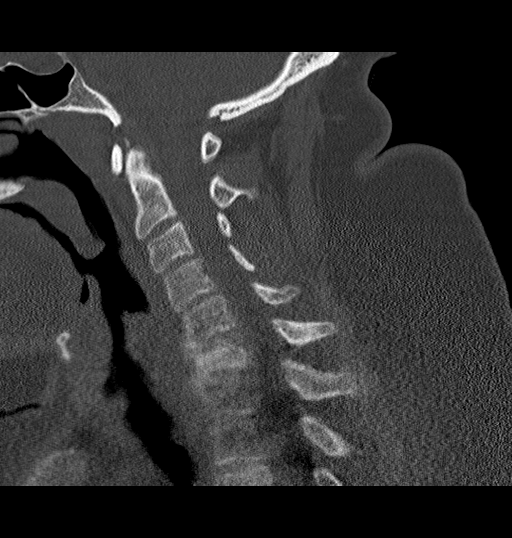
[im 34/58  bone]
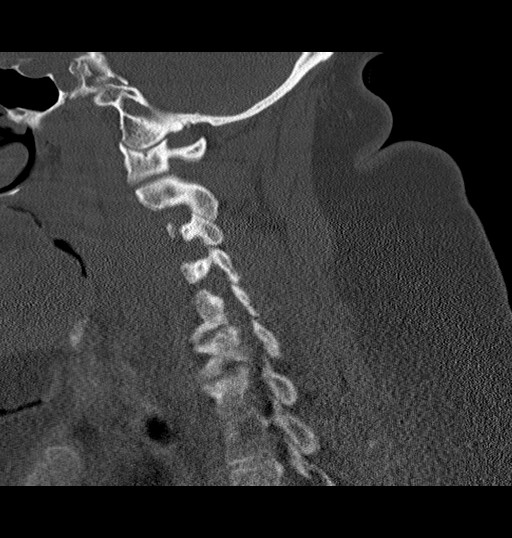
[im 39/58  bone]
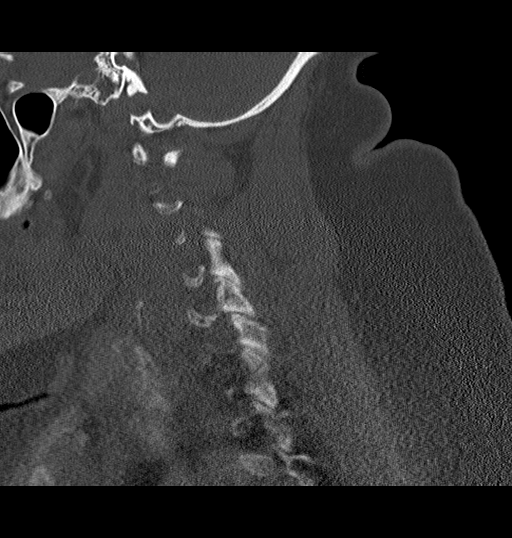

[12 of 33 positions shown; findings below may reference images not displayed]

FINDINGS: CT HEAD FINDINGS

Brain: No evidence of acute infarction, hemorrhage, hydrocephalus,
extra-axial collection or mass lesion/mass effect.

Vascular: No hyperdense vessel or unexpected calcification.

Skull: Normal. Negative for fracture or focal lesion.

Sinuses/Orbits: No acute finding.

Other: None.

CT CERVICAL SPINE FINDINGS

Alignment: Normal.

Skull base and vertebrae: No acute fracture. No primary bone lesion
or focal pathologic process.

Soft tissues and spinal canal: No prevertebral fluid or swelling. No
visible canal hematoma.

Disc levels: Mild to moderate degenerative disc disease at C5-6 and
C6-7 noted.

Upper chest: Negative.

Other: None
IMPRESSION: 1. No evidence of intracranial abnormality.
2. No static evidence of acute injury to the cervical spine. Mild to
moderate degenerative disc disease at C5-6 and C6-7.

## 2018-10-11 MED ORDER — AMLODIPINE BESYLATE 5 MG PO TABS
5.0000 mg | ORAL_TABLET | Freq: Once | ORAL | Status: AC
  Administered 2018-10-11: 5 mg via ORAL
  Filled 2018-10-11: qty 1

## 2018-10-11 MED ORDER — OXYCODONE-ACETAMINOPHEN 5-325 MG PO TABS
1.0000 | ORAL_TABLET | Freq: Once | ORAL | Status: AC
  Administered 2018-10-11: 1 via ORAL
  Filled 2018-10-11: qty 1

## 2018-10-11 NOTE — ED Notes (Signed)
ED Provider at bedside. 

## 2018-10-11 NOTE — ED Notes (Signed)
Gennette Pac RN at bedside with ultrasound

## 2018-10-11 NOTE — ED Notes (Signed)
Pt requesting medication for head and neck pain.  Message Dr Rex Kras in [place]. Awaiting new orders.

## 2018-10-11 NOTE — ED Notes (Signed)
Pt hard stick and requires ultrasound.

## 2018-10-11 NOTE — ED Provider Notes (Signed)
[facility] DEPT Provider Note   CSN: [id number] Arrival date & time: 10/11/18  1003     History   Chief Complaint Chief Complaint  Patient presents with  . Fall  . Neck Pain  . Urinary Frequency  . Dysuria    HPI Lindsay [name] is a 48 y.o. female.     48yo F w/ PMH including HTN, morbid obesity, CKD, fibromyalgia, anxiety/depression, anemia, IBS who p/w headache.2 days ago, she fell ~3.5 feet from her bed in the middle of the night when she tipped forward. No LOC. She landed on her head and since then has had severe top of head and neck pain. No extremity weakness/numbness. She also reports L hand pain since the fall. She has a h/o migraines but this feels different, started immediately after the fall.   Pt also reports 1 week of sharp, shooting R flank pain that wraps around towards bladder. Pain is constant but worse w/ urination. Denies hematuria, dysuria, or fever. Home health nurse this week told her she had protein in her urine (has h/o nephrotic syndrome.)  The history is provided by the patient.  Fall  Neck Pain Urinary Frequency  Dysuria   Past Medical History:  Diagnosis Date  . Acid reflux   . Anemia    Iron Def  . Anorexia   . Chronic kidney disease    Nephrotic syndrome  . Colon polyp 2009  . Depression with anxiety   . Edema leg   . Fibromyalgia   . Hemorrhoids   . Hidradenitis suppurativa   . Hypertension   . IBS (irritable bowel syndrome)   . Low back pain   . Migraines   . Morbidly obese ([place])   . Neuromuscular disorder (HCC)    fibromyalgia  . Neuropathy   . Panic attacks   . Polyp of vocal cord or larynx   . Tonsil pain     Patient Active Problem List   Diagnosis Date Noted  . Vision blurring 09/17/2018  . Fall 06/30/2018  . Frequent urination at night 04/23/2018  . Chronic cough 03/04/2018  . Dental infection 12/05/2017  . Right arm pain 12/05/2017  . Chronic respiratory failure with hypoxia  ([place])   . Low serum vitamin B12 05/02/2017  . Palpitations 04/11/2017  . Nutritional anemia 06/17/2016  . Allergic rhinitis 06/12/2016  . Vitamin D deficiency, unspecified 06/12/2016  . Nicotine dependence 06/12/2016  . Menorrhagia 01/08/2016  . Migraines 01/08/2016  . Bilateral leg pain 11/29/2015  . Hemorrhoids 07/05/2015  . Fibromyalgia 07/05/2015  . Chronic leg pain 09/26/2014  . Healthcare maintenance 09/26/2014  . Morbid obesity ([place]) 07/04/2014  . Major depressive disorder, recurrent episode, moderate ([place]) 10/13/2012  . Generalized anxiety disorder 10/13/2012  . Chronic nausea 05/10/2010  . IBS (irritable bowel syndrome) 06/16/2007  . Essential hypertension 02/06/2006  . HIDRADENITIS 11/18/2005    Past Surgical History:  Procedure Laterality Date  . AXILLARY HIDRADENITIS EXCISION    . COLONOSCOPY WITH PROPOFOL N/A 05/25/2015   Procedure: COLONOSCOPY WITH PROPOFOL;  Surgeon: Milus Banister, MD;  Location: [facility]COPY;  Service: Endoscopy;  Laterality: N/A;  . HEMORRHOID SURGERY     with Hidradenitis surgery   . INGUINAL HIDRADENITIS EXCISION    . TONSILLECTOMY  10/18/2010   by Dr. Wilburn Cornelia  . UPPER GASTROINTESTINAL ENDOSCOPY       OB History    Gravida  2   Para  1   Term      Preterm  AB  1   Living        SAB      TAB  1   Ectopic      Multiple      Live Births               Home Medications    Prior to Admission medications   Medication Sig Start Date End Date Taking? Authorizing Provider  albuterol (PROVENTIL) (2.5 MG/3ML) 0.083% nebulizer solution Take 3 mLs (2.5 mg total) by nebulization every 4 (four) hours. 08/12/18  Yes Olalere, Adewale A, MD  albuterol (VENTOLIN HFA) 108 (90 Base) MCG/ACT inhaler TAKE 2 PUFFS BY MOUTH EVERY 6 HOURS AS NEEDED FOR WHEEZE OR SHORTNESS OF BREATH Patient taking differently: Inhale 2 puffs into the lungs every 6 (six) hours as needed for wheezing or shortness of breath.  10/06/18  Yes Olalere,  Adewale A, MD  amLODipine (NORVASC) 10 MG tablet TAKE 1/2 TABLET BY MOUTH EVERY DAY Patient taking differently: Take 5 mg by mouth daily.  09/28/18  Yes Velna Ochs, MD  Brexpiprazole (REXULTI) 3 MG TABS Take 1 tablet (3 mg total) by mouth daily. 08/03/18  Yes [name], Arlyce Harman, MD  budesonide-formoterol (SYMBICORT) 160-4.5 MCG/ACT inhaler Inhale 2 puffs into the lungs 2 (two) times daily. 08/12/18  Yes Olalere, Adewale A, MD  Capsaicin 0.075 % GEL Apply 1 application topically 4 (four) times daily as needed. 08/20/18  Yes Seawell, Jaimie A, DO  CHANTIX CONTINUING MONTH PAK 1 MG tablet TAKE 1 TABLET BY MOUTH TWICE A DAY Patient taking differently: Take 1 mg by mouth 2 (two) times daily.  09/28/18  Yes Olalere, Adewale A, MD  cholecalciferol (VITAMIN D) 1000 units tablet Take 1 tablet (1,000 Units total) by mouth daily. 05/06/17  Yes Velna Ochs, MD  clorazepate (TRANXENE) 15 MG tablet TAKE 1 TABLET BY MOUTH EVERY DAY IN THE MORNING Patient taking differently: Take 7.5 mg by mouth daily.  08/03/18  Yes [name], Arlyce Harman, MD  DULoxetine (CYMBALTA) 60 MG capsule TAKE 1 CAPSULE BY MOUTH TWICE A DAY Patient taking differently: Take 60 mg by mouth 2 (two) times daily.  10/06/18  Yes Velna Ochs, MD  guaiFENesin (MUCINEX) 600 MG 12 hr tablet Take 600 mg by mouth 2 (two) times daily as needed for cough or to loosen phlegm.   Yes [provider]  hydrochlorothiazide (HYDRODIURIL) 25 MG tablet Take 1 tablet (25 mg total) by mouth daily. 11/17/17  Yes Velna Ochs, MD  hydrOXYzine (VISTARIL) 50 MG capsule Take one capsule daily as needed for anxiety Patient taking differently: Take 50 mg by mouth daily as needed for anxiety.  08/03/18  Yes [name], Arlyce Harman, MD  iron polysaccharides (NU-IRON) 150 MG capsule Take 1 capsule (150 mg total) by mouth daily. 08/20/18  Yes Seawell, Jaimie A, DO  Multiple Vitamin (MULTIVITAMIN WITH MINERALS) TABS tablet Take 1 tablet by mouth daily.   Yes [provider]  omeprazole (PRILOSEC) 40 MG capsule Take 1 capsule (40 mg total) by mouth daily. 06/29/18  Yes Sid Falcon, MD  pregabalin (LYRICA) 100 MG capsule TAKE 1 CAPSULE BY MOUTH THREE TIMES A DAY Patient taking differently: Take 100 mg by mouth 3 (three) times daily.  06/25/18  Yes Velna Ochs, MD  promethazine (PHENERGAN) 12.5 MG tablet Take 1 tablet (12.5 mg total) by mouth every 6 (six) hours as needed for nausea or vomiting. 09/29/18  Yes Milus Banister, MD  SYMBICORT 160-4.5 MCG/ACT inhaler TAKE 2 PUFFS  BY MOUTH TWICE A DAY Patient taking differently: Inhale 2 puffs into the lungs 2 (two) times daily. TAKE 2 PUFFS BY MOUTH TWICE A DAY 08/12/18  Yes Olalere, Adewale A, MD  valsartan (DIOVAN) 160 MG tablet Take 1 tablet (160 mg total) by mouth daily. 07/08/18  Yes Velna Ochs, MD  vitamin B-12 (CYANOCOBALAMIN) 1000 MCG tablet Take 1 tablet (1,000 mcg total) by mouth daily. 05/06/17  Yes Velna Ochs, MD  vitamin C (ASCORBIC ACID) 500 MG tablet Take 500 mg by mouth daily.   Yes [provider]  benzonatate (TESSALON PERLES) 100 MG capsule Take 1 capsule (100 mg total) by mouth every 6 (six) hours as needed for cough. Patient not taking: Reported on 10/11/2018 03/18/18 03/18/19  Velna Ochs, MD  naproxen (NAPROSYN) 500 MG tablet Take 1 tablet (500 mg total) by mouth 2 (two) times daily as needed. Patient not taking: Reported on 10/11/2018 07/23/18   Velna Ochs, MD  polyethylene glycol-electrolytes (NULYTELY/GOLYTELY) 420 g solution Take 4,000 mLs by mouth as directed. Patient not taking: Reported on 10/11/2018 06/26/18   Velna Ochs, MD  tiZANidine (ZANAFLEX) 4 MG tablet TAKE 1 TABLET BY MOUTH EVERY 8 HOURS AS NEEDED Patient not taking: Reported on 10/11/2018 08/03/18   Sid Falcon, MD  varenicline (CHANTIX PAK) 0.5 MG X 11 & 1 MG X 42 tablet Take one 0.5 mg tablet by mouth once daily for 3 days, then increase to one 0.5 mg tablet twice daily for 4  days, then increase to one 1 mg tablet twice daily. Patient not taking: Reported on 10/11/2018 08/12/18   Laurin Coder, MD    Family History Family History  Problem Relation Age of Onset  . Diabetes Mother   . Heart disease Mother        valve leak  . Anxiety disorder Mother   . Depression Mother   . High blood pressure Mother   . Kidney failure Mother   . Cancer Father        prostate  . Heart disease Father   . Learning disabilities Sister   . Depression Sister   . Depression Sister   . Anxiety disorder Sister   . Colon cancer Neg Hx     Social History Social History   Tobacco Use  . Smoking status: Current Some Day Smoker    Packs/day: 0.50    Years: 25.00    Pack years: 12.50    Types: Cigarettes  . Smokeless tobacco: Never Used  . Tobacco comment: 1/2 pack a day 08/12/18  Substance Use Topics  . Alcohol use: No    Alcohol/week: 0.0 standard drinks    Comment: occassionally  . Drug use: No     Allergies   Lisinopril   Review of Systems Review of Systems  Genitourinary: Positive for dysuria and frequency.  Musculoskeletal: Positive for neck pain.   All other systems reviewed and are negative except that which was mentioned in HPI   Physical Exam Updated Vital Signs BP (!) 149/101 (BP Location: Right Arm)   Pulse 78   Temp 98.7 F (37.1 C) (Oral)   Resp 17   Ht 5\' 11"  (1.803 m)   Wt (!) 163.3 kg   LMP 10/11/2018   SpO2 100%   BMI 50.21 kg/m   Physical Exam Vitals signs and nursing note reviewed.  Constitutional:      General: She is not in acute distress.    Appearance: She is well-developed. She is obese.  HENT:  Head: Normocephalic.     Comments: Tenderness with hematoma top of scalp, no abrasions Eyes:     Extraocular Movements: Extraocular movements intact.     Conjunctiva/sclera: Conjunctivae normal.     Pupils: Pupils are equal, round, and reactive to light.  Neck:     Musculoskeletal: Neck supple.  Cardiovascular:      Rate and Rhythm: Normal rate and regular rhythm.     Heart sounds: Normal heart sounds. No murmur.  Pulmonary:     Effort: Pulmonary effort is normal.     Breath sounds: Normal breath sounds.  Abdominal:     General: Bowel sounds are normal. There is no distension.     Palpations: Abdomen is soft.     Tenderness: There is no abdominal tenderness.  Musculoskeletal:        General: Tenderness present.     Comments: Tenderness dorsal L hand over 3rd and 4th metacarpals, full ROM LUE and hand  Skin:    General: Skin is warm and dry.  Neurological:     Mental Status: She is alert and oriented to person, place, and time.     Sensory: No sensory deficit.     Motor: No weakness.     Comments: Fluent speech  Psychiatric:        Judgment: Judgment normal.      ED Treatments / Results  Labs (all labs ordered are listed, but only abnormal results are displayed) Labs Reviewed  COMPREHENSIVE METABOLIC PANEL - Abnormal; Notable for the following components:      Result Value   Calcium 8.7 (*)    Total Protein 8.3 (*)    AST 12 (*)    All other components within normal limits  CBC WITH DIFFERENTIAL/PLATELET - Abnormal; Notable for the following components:   RBC 6.08 (*)    Hemoglobin 15.4 (*)    HCT 51.3 (*)    MCH 25.3 (*)    RDW 19.1 (*)    All other components within normal limits  URINE CULTURE  URINALYSIS, ROUTINE W REFLEX MICROSCOPIC  POC URINE PREG, ED    EKG None  Radiology Ct Head Wo Contrast  Result Date: 10/11/2018 CLINICAL DATA:  48 year old female with acute head and neck pain following fall 2 days ago. Initial encounter. EXAM: CT HEAD WITHOUT CONTRAST CT CERVICAL SPINE WITHOUT CONTRAST TECHNIQUE: Multidetector CT imaging of the head and cervical spine was performed following the standard protocol without intravenous contrast. Multiplanar CT image reconstructions of the cervical spine were also generated. COMPARISON:  03/21/2008 CT FINDINGS: CT HEAD FINDINGS Brain:  No evidence of acute infarction, hemorrhage, hydrocephalus, extra-axial collection or mass lesion/mass effect. Vascular: No hyperdense vessel or unexpected calcification. Skull: Normal. Negative for fracture or focal lesion. Sinuses/Orbits: No acute finding. Other: None. CT CERVICAL SPINE FINDINGS Alignment: Normal. Skull base and vertebrae: No acute fracture. No primary bone lesion or focal pathologic process. Soft tissues and spinal canal: No prevertebral fluid or swelling. No visible canal hematoma. Disc levels: Mild to moderate degenerative disc disease at C5-6 and C6-7 noted. Upper chest: Negative. Other: None IMPRESSION: 1. No evidence of intracranial abnormality. 2. No static evidence of acute injury to the cervical spine. Mild to moderate degenerative disc disease at C5-6 and C6-7. Electronically Signed   By: Margarette Canada M.D.   On: 10/11/2018 14:25   Ct Cervical Spine Wo Contrast  Result Date: 10/11/2018 CLINICAL DATA:  48 year old female with acute head and neck pain following fall 2 days ago. Initial encounter.  EXAM: CT HEAD WITHOUT CONTRAST CT CERVICAL SPINE WITHOUT CONTRAST TECHNIQUE: Multidetector CT imaging of the head and cervical spine was performed following the standard protocol without intravenous contrast. Multiplanar CT image reconstructions of the cervical spine were also generated. COMPARISON:  03/21/2008 CT FINDINGS: CT HEAD FINDINGS Brain: No evidence of acute infarction, hemorrhage, hydrocephalus, extra-axial collection or mass lesion/mass effect. Vascular: No hyperdense vessel or unexpected calcification. Skull: Normal. Negative for fracture or focal lesion. Sinuses/Orbits: No acute finding. Other: None. CT CERVICAL SPINE FINDINGS Alignment: Normal. Skull base and vertebrae: No acute fracture. No primary bone lesion or focal pathologic process. Soft tissues and spinal canal: No prevertebral fluid or swelling. No visible canal hematoma. Disc levels: Mild to moderate degenerative disc  disease at C5-6 and C6-7 noted. Upper chest: Negative. Other: None IMPRESSION: 1. No evidence of intracranial abnormality. 2. No static evidence of acute injury to the cervical spine. Mild to moderate degenerative disc disease at C5-6 and C6-7. Electronically Signed   By: Margarette Canada M.D.   On: 10/11/2018 14:25   Dg Hand Complete Left  Result Date: 10/11/2018 CLINICAL DATA:  Patient status post fall.  Fifth metacarpal pain. EXAM: LEFT HAND - COMPLETE 3+ VIEW COMPARISON:  None. FINDINGS: There is no evidence of fracture or dislocation. There is no evidence of arthropathy or other focal bone abnormality. Soft tissues are unremarkable. IMPRESSION: No acute osseous abnormality. Electronically Signed   By: Lovey Newcomer M.D.   On: 10/11/2018 13:37   Ct Renal Stone Study  Result Date: 10/11/2018 CLINICAL DATA:  48 year old female with acute RIGHT flank and abdominal pain for 1 week. EXAM: CT ABDOMEN AND PELVIS WITHOUT CONTRAST TECHNIQUE: Multidetector CT imaging of the abdomen and pelvis was performed following the standard protocol without IV contrast. COMPARISON:  07/17/2014 CT FINDINGS: Please note that parenchymal abnormalities may be missed without intravenous contrast. Lower chest: No acute abnormality. Hepatobiliary: The liver and gallbladder are unremarkable. No biliary dilatation. Pancreas: Unremarkable Spleen: Unremarkable Adrenals/Urinary Tract: The kidneys, adrenal glands and bladder are unremarkable. No hydronephrosis or urinary calculi identified. Stomach/Bowel: Stomach is within normal limits. Appendix appears normal. No evidence of bowel wall thickening, distention, or inflammatory changes. Vascular/Lymphatic: Mild aortic atherosclerosis. No enlarged abdominal or pelvic lymph nodes. Reproductive: Uterus and bilateral adnexa are unremarkable. Other: No ascites, pneumoperitoneum or focal collection. Musculoskeletal: No acute or suspicious bony abnormalities. IMPRESSION: 1. No evidence of acute  abnormality. No CT findings to suggest a cause for this patient's abdominal pain. 2.  Aortic Atherosclerosis (ICD10-I70.0). Electronically Signed   By: Margarette Canada M.D.   On: 10/11/2018 14:30    Procedures Procedures (including critical care time)  Medications Ordered in ED Medications  amLODipine (NORVASC) tablet 5 mg (5 mg Oral Given 10/11/18 1155)  oxyCODONE-acetaminophen (PERCOCET/ROXICET) 5-325 MG per tablet 1 tablet (1 tablet Oral Given 10/11/18 1236)     Initial Impression / Assessment and Plan / ED Course  I have reviewed the triage vital signs and the nursing notes.  Pertinent labs & imaging results that were available during my care of the patient were reviewed by me and considered in my medical decision making (see chart for details).        Neurologically intact on exam.  CT head and C-spine negative for acute injury.  Lab work reassuring including normal creatinine, normal WBC count, normal urine.  Given her recent trauma and preceding flank pain, obtain CT renal study to evaluate for stone or injury.  CT negative acute.  Suspect musculoskeletal cause  of pain.  I have discussed supportive measures and reviewed return precautions.  Instructed to follow-up with PCP if symptoms do not improve.  Patient voiced understanding.  Final Clinical Impressions(s) / ED Diagnoses   Final diagnoses:  Nonintractable headache, unspecified chronicity pattern, unspecified headache type  Injury of head, initial encounter  Right flank pain    ED Discharge Orders    None       Little, Wenda Overland, MD 10/11/18 1507

## 2018-10-11 NOTE — ED Triage Notes (Addendum)
Patient reports that she had to go to the bathroom 2 days ago at 0200 and she sat up, was very sleepy, still sitting on the side of the bed and tipped forward, hitting her head on a hardwood floor. Patient c/o headache and neck pain.  Patient also c/o dysuria and urinary frequency x 1 week.  Patient was also hypertensive in triage, but states she did not take her Amlodipine today. (191/120)  Patient has home O2 4L/min via [place].

## 2018-10-11 NOTE — ED Notes (Signed)
Pt. In X-ray. Nurse aware.

## 2018-10-12 ENCOUNTER — Other Ambulatory Visit (HOSPITAL_COMMUNITY): Payer: Self-pay

## 2018-10-12 ENCOUNTER — Ambulatory Visit: Payer: Medicare Other | Admitting: Physical Therapy

## 2018-10-12 DIAGNOSIS — H442E2 Degenerative myopia with other maculopathy, left eye: Secondary | ICD-10-CM | POA: Diagnosis not present

## 2018-10-12 DIAGNOSIS — H35462 Secondary vitreoretinal degeneration, left eye: Secondary | ICD-10-CM | POA: Diagnosis not present

## 2018-10-12 DIAGNOSIS — F411 Generalized anxiety disorder: Secondary | ICD-10-CM

## 2018-10-12 DIAGNOSIS — H43823 Vitreomacular adhesion, bilateral: Secondary | ICD-10-CM | POA: Diagnosis not present

## 2018-10-12 DIAGNOSIS — H35033 Hypertensive retinopathy, bilateral: Secondary | ICD-10-CM | POA: Diagnosis not present

## 2018-10-12 DIAGNOSIS — F331 Major depressive disorder, recurrent, moderate: Secondary | ICD-10-CM

## 2018-10-12 MED ORDER — REXULTI 3 MG PO TABS: 3.0000 mg | ORAL_TABLET | Freq: Every day | ORAL | 0 refills | Status: DC

## 2018-10-13 DIAGNOSIS — J9620 Acute and chronic respiratory failure, unspecified whether with hypoxia or hypercapnia: Secondary | ICD-10-CM | POA: Diagnosis not present

## 2018-10-13 DIAGNOSIS — H5203 Hypermetropia, bilateral: Secondary | ICD-10-CM | POA: Diagnosis not present

## 2018-10-14 ENCOUNTER — Ambulatory Visit: Payer: Medicare Other | Admitting: Physical Therapy

## 2018-10-14 ENCOUNTER — Telehealth: Payer: Self-pay | Admitting: Gastroenterology

## 2018-10-14 LAB — URINE CULTURE
Culture: 20000 — AB
Special Requests: NORMAL

## 2018-10-14 NOTE — Telephone Encounter (Signed)
Pt requested a call back to discuss pain management and drug interactions.  She also stated she was not able to tolerate the prep soln from previous colonoscopy.

## 2018-10-14 NOTE — Telephone Encounter (Signed)
The pt has been scheduled to come in and discuss colonoscopy and concerns about being sedated.

## 2018-10-15 ENCOUNTER — Telehealth: Payer: Self-pay

## 2018-10-15 NOTE — Telephone Encounter (Signed)
No treatment for UC ED 10/11/2018 per Providence Lanius PA

## 2018-10-15 NOTE — Progress Notes (Signed)
ED Antimicrobial Stewardship Positive Culture Follow Up   Lindsay [name] is an 48 y.o. female who presented to [facility] on 10/11/2018 with a chief complaint of  Fall, unrinary frequency, dysuria.  Chief Complaint  Patient presents with  . Fall  . Neck Pain  . Urinary Frequency  . Dysuria    Recent Results (from the past 720 hour(s))  Urine culture     Status: Abnormal   Collection Time: 10/11/18 11:43 AM   Specimen: Urine, Clean Catch  Result Value Ref Range Status   Specimen Description   Final    URINE, CLEAN CATCH Performed at [facility], [place] [address], [place], [place] [address]    Special Requests   Final    Normal Performed at [facility], [place] [address], [place], [place] [address]    Culture 20,000 COLONIES/mL KLEBSIELLA PNEUMONIAE (A)  Final   Report Status 10/14/2018 FINAL  Final   Organism ID, Bacteria KLEBSIELLA PNEUMONIAE (A)  Final      Susceptibility   Klebsiella pneumoniae - MIC*    AMPICILLIN >=32 RESISTANT Resistant     CEFAZOLIN >=64 RESISTANT Resistant     CEFTRIAXONE <=1 SENSITIVE Sensitive     CIPROFLOXACIN 1 SENSITIVE Sensitive     GENTAMICIN <=1 SENSITIVE Sensitive     IMIPENEM <=0.25 SENSITIVE Sensitive     NITROFURANTOIN 128 RESISTANT Resistant     TRIMETH/SULFA <=20 SENSITIVE Sensitive     AMPICILLIN/SULBACTAM >=32 RESISTANT Resistant     PIP/TAZO 8 SENSITIVE Sensitive     Extended ESBL NEGATIVE Sensitive     * 20,000 COLONIES/mL KLEBSIELLA PNEUMONIAE    Plan: - no treatment is needed at this time  ED Provider: Providence Lanius, PA-C   Dia Sitter P 10/15/2018, 9:44 AM Clinical Pharmacist [phone]

## 2018-10-16 DIAGNOSIS — M545 Low back pain: Secondary | ICD-10-CM | POA: Diagnosis not present

## 2018-10-19 ENCOUNTER — Ambulatory Visit: Payer: Medicare Other | Admitting: Physical Therapy

## 2018-10-19 ENCOUNTER — Other Ambulatory Visit: Payer: Self-pay | Admitting: Pulmonary Disease

## 2018-10-19 ENCOUNTER — Other Ambulatory Visit: Payer: Self-pay

## 2018-10-19 DIAGNOSIS — R2681 Unsteadiness on feet: Secondary | ICD-10-CM | POA: Diagnosis not present

## 2018-10-19 DIAGNOSIS — G8929 Other chronic pain: Secondary | ICD-10-CM

## 2018-10-19 DIAGNOSIS — R2689 Other abnormalities of gait and mobility: Secondary | ICD-10-CM | POA: Diagnosis not present

## 2018-10-19 DIAGNOSIS — M545 Low back pain, unspecified: Secondary | ICD-10-CM

## 2018-10-19 DIAGNOSIS — M6281 Muscle weakness (generalized): Secondary | ICD-10-CM | POA: Diagnosis not present

## 2018-10-19 DIAGNOSIS — M542 Cervicalgia: Secondary | ICD-10-CM | POA: Diagnosis not present

## 2018-10-19 DIAGNOSIS — R293 Abnormal posture: Secondary | ICD-10-CM

## 2018-10-19 NOTE — Therapy (Signed)
[facility] [address] [place], [place], [address] Phone: [phone]   Fax:  [phone]  Physical Therapy Treatment  Patient Details  Name: Lindsay [name] MRN: [id number] Date of Birth: Jul 20, 1970 Referring Provider (PT): Gilles Chiquito, MD   Encounter Date: 10/19/2018  PT End of Session - 10/19/18 0818    Visit Number  14    Number of Visits  25    Date for PT Re-Evaluation  11/06/18    Authorization Type  [employer] Medicare & Medicaid    PT Start Time  0805    PT Stop Time  0845    PT Time Calculation (min)  40 min    Equipment Utilized During Treatment  Oxygen   arrived without her oxygen on & PT had her put on her oxygen   Activity Tolerance  Patient tolerated treatment well    Behavior During Therapy  [facility] for tasks assessed/performed       Past Medical History:  Diagnosis Date  . Acid reflux   . Anemia    Iron Def  . Anorexia   . Chronic kidney disease    Nephrotic syndrome  . Colon polyp 2009  . Depression with anxiety   . Edema leg   . Fibromyalgia   . Hemorrhoids   . Hidradenitis suppurativa   . Hypertension   . IBS (irritable bowel syndrome)   . Low back pain   . Migraines   . Morbidly obese ([place])   . Neuromuscular disorder (HCC)    fibromyalgia  . Neuropathy   . Panic attacks   . Polyp of vocal cord or larynx   . Tonsil pain     Past Surgical History:  Procedure Laterality Date  . AXILLARY HIDRADENITIS EXCISION    . COLONOSCOPY WITH PROPOFOL N/A 05/25/2015   Procedure: COLONOSCOPY WITH PROPOFOL;  Surgeon: Milus Banister, MD;  Location: [facility];  Service: Endoscopy;  Laterality: N/A;  . HEMORRHOID SURGERY     with Hidradenitis surgery   . INGUINAL HIDRADENITIS EXCISION    . TONSILLECTOMY  10/18/2010   by Dr. Wilburn Cornelia  . UPPER GASTROINTESTINAL ENDOSCOPY      There were no vitals filed for this visit.  Subjective Assessment - 10/19/18 0819    Subjective  She missed last visit  due to having another fall when she got out of bed she tripped forward and hit her head. She went to ER and had CT scans of her head and neck which were negative for acute injury but do show some mild to mod DDD.    Pertinent History  Chronic Kidney disease, Fibromyalgia, HTN, IBS, LBP, neuropathy,    Patient Stated Goals  To improve walking, move legs better. tolerating pain.    Currently in Pain?  Yes    Pain Score  9     Pain Location  --   neck, back, legs, hand   Pain Descriptors / Indicators  Aching    Pain Type  Acute pain    Pain Onset  More than a month ago    Pain Onset  More than a month ago       Treatment/Interventions:  seated upper trap stretch 30 sec X 2 ea seated neck rotations AAROM with towel X 10 bilat chin tucks X 15 supine LTR X 10 bilat hold 3 sec and piriformis stretch 30 sec X 2 bilat Neurorehab: walking in hall with with head turns up/down and Lt/Rt, walking with eyes closed, retro  walking, and tandem walking up/down X 2 ea with supervision (performed without O2 and after this monitored O2 levels was 93%). Sit to stands no UE 2X5 Stairs no UE X 5 (O2 dropped to 86% but able to rebound in 2 min with pursed lip breathing >91% without need of supplemental O2)    PT Short Term Goals - 10/19/18 0841      PT SHORT TERM GOAL #1   Title  Patient verbalizes & demonstrates understanding of initial HEP. (All STGs Target Date: 09/09/2018)    Baseline  MET 10/06/2018    Time  4    Period  Weeks    Status  Achieved    Target Date  10/09/18      PT SHORT TERM GOAL #2   Title  Patient reports 25% improvement in cervical pain.    Baseline  MET 10/06/2018 but now more neck pain after recent fall    Time  4    Period  Weeks    Status  On-going   was met but now more neck pain after recent fall   Target Date  10/09/18      PT SHORT TERM GOAL #3   Title  Patient reports 25% improvement in lumbar & buttocks / hip pain.    Time  4    Period  Weeks    Status  On-going     Target Date  10/09/18      PT SHORT TERM GOAL #4   Title  Berg Balance Test >/= 43/56    Baseline  50 on 9/9    Time  4    Period  Weeks    Status  Achieved    Target Date  10/09/18      PT SHORT TERM GOAL #5   Title  Patient ambulates with head turns to scan environment without change in pace or path.    Baseline  MET 10/06/2018    Time  4    Period  Weeks    Status  Achieved      PT SHORT TERM GOAL #6   Title  Gait Velocity >2.5 ft/sec    Baseline  2.5 ft/sec on 9/9    Time  4    Period  Weeks    Status  Achieved    Target Date  10/09/18        PT Long Term Goals - 08/10/18 1824      PT LONG TERM GOAL #1   Title  Patient verbalizes & demonstrates ongoing HEP / fitness plan and exercise program for [employer]. (All LTGs Target Date 11/06/2018)    Time  12    Period  Weeks    Status  New    Target Date  11/06/18      PT LONG TERM GOAL #2   Title  Patient reports cervical pain to </= 5/10 with activities.    Time  12    Period  Weeks    Status  New    Target Date  11/06/18      PT LONG TERM GOAL #3   Title  Patient reports lumbar & buttock / hip pain </= 4/10 with standing & gait activities.    Time  12    Period  Weeks    Status  New    Target Date  11/06/18      PT LONG TERM GOAL #4   Title  Berg Balance Test >/= 47/56 to indicate lower fall risk.  Time  12    Period  Weeks    Status  New    Target Date  11/06/18      PT LONG TERM GOAL #5   Title  Functional Gait Assessment >/= 15/30 to indicate lower fall risk.    Time  12    Period  Weeks    Status  New    Target Date  11/06/18      Additional Long Term Goals   Additional Long Term Goals  Yes      PT LONG TERM GOAL #6   Title  Gait Velocity >2.62 ft/sec to indicate fuller community mobility.    Time  12    Period  Weeks    Status  New    Target Date  11/06/18            Plan - 10/19/18 0851    Clinical Impression Statement  Despite having a fall last week and being in more pain overall,  she showed improvements with dynamic gait/balance and endurance however still limited in these areas. Session performed without supplemental O2 and O2 levels were good until the end of session with up/down the stairs X 5 dropped her O2 levels to 86% but after 2 min of deep breathing she was able to rebound >91% without relying on her supplemental O2.    Comorbidities  Chronic Kidney disease, Fibromyalgia, HTN, IBS, LBP, neuropathy,    Rehab Potential  Good    PT Frequency  2x / week    PT Treatment/Interventions  ADLs/Self Care Home Management;Aquatic Therapy;Cryotherapy;Electrical Stimulation;Moist Heat;Traction;Ultrasound;DME Instruction;Gait training;Stair training;Functional mobility training;Therapeutic activities;Therapeutic exercise;Balance training;Neuromuscular re-education;Patient/family education;Manual techniques;Passive range of motion;Dry needling;Vestibular;Spinal Manipulations;Joint Manipulations    PT Next Visit Plan  she has not received her rollator yet.  check remaining 3 STGs.    PT Home Exercise Plan  Access Code: [email or web address]    Consulted and Agree with Plan of Care  Patient       Patient will benefit from skilled therapeutic intervention in order to improve the following deficits and impairments:     Visit Diagnosis: Muscle weakness (generalized)  Unsteadiness on feet  Other abnormalities of gait and mobility  Chronic midline low back pain without sciatica  Cervicalgia  Abnormal posture     Problem List Patient Active Problem List   Diagnosis Date Noted  . Vision blurring 09/17/2018  . Fall 06/30/2018  . Frequent urination at night 04/23/2018  . Chronic cough 03/04/2018  . Dental infection 12/05/2017  . Right arm pain 12/05/2017  . Chronic respiratory failure with hypoxia ([place])   . Low serum vitamin B12 05/02/2017  . Palpitations 04/11/2017  . Nutritional anemia 06/17/2016  . Allergic rhinitis 06/12/2016  . Vitamin D deficiency, unspecified 06/12/2016   . Nicotine dependence 06/12/2016  . Menorrhagia 01/08/2016  . Migraines 01/08/2016  . Bilateral leg pain 11/29/2015  . Hemorrhoids 07/05/2015  . Fibromyalgia 07/05/2015  . Chronic leg pain 09/26/2014  . Healthcare maintenance 09/26/2014  . Morbid obesity ([place]) 07/04/2014  . Major depressive disorder, recurrent episode, moderate ([place]) 10/13/2012  . Generalized anxiety disorder 10/13/2012  . Chronic nausea 05/10/2010  . IBS (irritable bowel syndrome) 06/16/2007  . Essential hypertension 02/06/2006  . HIDRADENITIS 11/18/2005    Silvestre Mesi 10/19/2018, 9:01 AM  [facility] [address] [employer] [place], [place], [address] Phone: [phone]   Fax:  [phone]  Name: Lindsay [name] MRN: [id number] Date of Birth: 1970/04/20

## 2018-10-21 ENCOUNTER — Other Ambulatory Visit: Payer: Self-pay

## 2018-10-21 ENCOUNTER — Ambulatory Visit: Payer: Medicare Other | Admitting: Physical Therapy

## 2018-10-21 DIAGNOSIS — M545 Low back pain: Secondary | ICD-10-CM | POA: Diagnosis not present

## 2018-10-21 DIAGNOSIS — R2689 Other abnormalities of gait and mobility: Secondary | ICD-10-CM

## 2018-10-21 DIAGNOSIS — R293 Abnormal posture: Secondary | ICD-10-CM | POA: Diagnosis not present

## 2018-10-21 DIAGNOSIS — G8929 Other chronic pain: Secondary | ICD-10-CM | POA: Diagnosis not present

## 2018-10-21 DIAGNOSIS — M542 Cervicalgia: Secondary | ICD-10-CM

## 2018-10-21 DIAGNOSIS — R2681 Unsteadiness on feet: Secondary | ICD-10-CM | POA: Diagnosis not present

## 2018-10-21 DIAGNOSIS — M6281 Muscle weakness (generalized): Secondary | ICD-10-CM

## 2018-10-21 NOTE — Therapy (Signed)
[facility] [address] [place], [place], [address] Phone: [phone]   Fax:  [phone]  Physical Therapy Treatment  Patient Details  Name: Lindsay [name] MRN: [id number] Date of Birth: 23-Feb-1970 Referring Provider (PT): Gilles Chiquito, MD   Encounter Date: 10/21/2018  PT End of Session - 10/21/18 0812    Visit Number  15    Number of Visits  25    Date for PT Re-Evaluation  11/06/18    Authorization Type  UHC Medicare & Medicaid    PT Start Time  0805    PT Stop Time  0845    PT Time Calculation (min)  40 min    Equipment Utilized During Treatment  Oxygen   arrived without her oxygen on & PT had her put on her oxygen   Activity Tolerance  Patient tolerated treatment well    Behavior During Therapy  [facility] for tasks assessed/performed       Past Medical History:  Diagnosis Date  . Acid reflux   . Anemia    Iron Def  . Anorexia   . Chronic kidney disease    Nephrotic syndrome  . Colon polyp 2009  . Depression with anxiety   . Edema leg   . Fibromyalgia   . Hemorrhoids   . Hidradenitis suppurativa   . Hypertension   . IBS (irritable bowel syndrome)   . Low back pain   . Migraines   . Morbidly obese ([place])   . Neuromuscular disorder (HCC)    fibromyalgia  . Neuropathy   . Panic attacks   . Polyp of vocal cord or larynx   . Tonsil pain     Past Surgical History:  Procedure Laterality Date  . AXILLARY HIDRADENITIS EXCISION    . COLONOSCOPY WITH PROPOFOL N/A 05/25/2015   Procedure: COLONOSCOPY WITH PROPOFOL;  Surgeon: Milus Banister, MD;  Location: [facility];  Service: Endoscopy;  Laterality: N/A;  . HEMORRHOID SURGERY     with Hidradenitis surgery   . INGUINAL HIDRADENITIS EXCISION    . TONSILLECTOMY  10/18/2010   by Dr. Wilburn Cornelia  . UPPER GASTROINTESTINAL ENDOSCOPY      There were no vitals filed for this visit.  Subjective Assessment - 10/21/18 0811    Subjective  Relays she feels a  little sick on her stomach so she is not sure how much she can do today. Says the colder weather is really making her hurt worse. Relays 9/10 pain in neck, back, legs         [facility] PT Assessment - 10/21/18 0001      Assessment   Medical Diagnosis  fall & chronic pain    Referring Provider (PT)  Gilles Chiquito, MD      AROM   Cervical Flexion  [facility]    Cervical Extension  25%    Cervical - Right Side Bend  50%    Cervical - Left Side Bend  50%    Cervical - Right Rotation  WFL    Cervical - Left Rotation  WFL    Lumbar Flexion  50% ROM   limited by pain   Lumbar Extension  50%   limited by pain   Lumbar - Right Side Bend  [facility]    Lumbar - Left Side Bend  [facility]    Lumbar - Right Rotation  [facility]    Lumbar - Left Rotation  [facility]      Strength   Right Hip Flexion  4/5  Right Hip Extension  4/5    Right Hip ABduction  4/5    Left Hip Flexion  4/5    Left Hip Extension  4/5    Left Hip ABduction  4/5    Right Knee Flexion  5/5    Right Knee Extension  5/5    Left Knee Flexion  5/5    Left Knee Extension  5/5                   [facility] Adult PT Treatment/Exercise - 10/21/18 0001      High Level Balance   High Level Balance Comments  in bars, tandem walk,etro walk, walking with head turns up/down and left/right, and sidestepping on foam beam all  up/down X 3 ea with intermit UE support as needed      Exercises   Exercises  Ankle      Lumbar Exercises: Stretches   Passive Hamstring Stretch  Right;Left;2 reps;30 seconds    Passive Hamstring Stretch Limitations  seated with LE extended & towel to add gastroc stretch for combo    Lower Trunk Rotation Limitations  10 reps X 5 sec    Piriformis Stretch  Right;Left;2 reps;30 seconds    Piriformis Stretch Limitations  seated    Other Lumbar Stretch Exercise  supine chin tucks X 20, then upper trap stretch for neck sidebending ROM 30 sec X 2 ea      Lumbar Exercises: Aerobic   Other Aerobic Exercise  7 min on sci fit UE/LE with  heat on her back               PT Short Term Goals - 10/19/18 0841      PT SHORT TERM GOAL #1   Title  Patient verbalizes & demonstrates understanding of initial HEP. (All STGs Target Date: 09/09/2018)    Baseline  MET 10/06/2018    Time  4    Period  Weeks    Status  Achieved    Target Date  10/09/18      PT SHORT TERM GOAL #2   Title  Patient reports 25% improvement in cervical pain.    Baseline  MET 10/06/2018 but now more neck pain after recent fall    Time  4    Period  Weeks    Status  On-going   was met but now more neck pain after recent fall   Target Date  10/09/18      PT SHORT TERM GOAL #3   Title  Patient reports 25% improvement in lumbar & buttocks / hip pain.    Time  4    Period  Weeks    Status  On-going    Target Date  10/09/18      PT SHORT TERM GOAL #4   Title  Berg Balance Test >/= 43/56    Baseline  50 on 9/9    Time  4    Period  Weeks    Status  Achieved    Target Date  10/09/18      PT SHORT TERM GOAL #5   Title  Patient ambulates with head turns to scan environment without change in pace or path.    Baseline  MET 10/06/2018    Time  4    Period  Weeks    Status  Achieved      PT SHORT TERM GOAL #6   Title  Gait Velocity >2.5 ft/sec    Baseline  2.5 ft/sec  on 9/9    Time  4    Period  Weeks    Status  Achieved    Target Date  10/09/18        PT Long Term Goals - 08/10/18 1824      PT LONG TERM GOAL #1   Title  Patient verbalizes & demonstrates ongoing HEP / fitness plan and exercise program for [employer]. (All LTGs Target Date 11/06/2018)    Time  12    Period  Weeks    Status  New    Target Date  11/06/18      PT LONG TERM GOAL #2   Title  Patient reports cervical pain to </= 5/10 with activities.    Time  12    Period  Weeks    Status  New    Target Date  11/06/18      PT LONG TERM GOAL #3   Title  Patient reports lumbar & buttock / hip pain </= 4/10 with standing & gait activities.    Time  12    Period  Weeks    Status   New    Target Date  11/06/18      PT LONG TERM GOAL #4   Title  Berg Balance Test >/= 47/56 to indicate lower fall risk.    Time  12    Period  Weeks    Status  New    Target Date  11/06/18      PT LONG TERM GOAL #5   Title  Functional Gait Assessment >/= 15/30 to indicate lower fall risk.    Time  12    Period  Weeks    Status  New    Target Date  11/06/18      Additional Long Term Goals   Additional Long Term Goals  Yes      PT LONG TERM GOAL #6   Title  Gait Velocity >2.62 ft/sec to indicate fuller community mobility.    Time  12    Period  Weeks    Status  New    Target Date  11/06/18            Plan - 10/21/18 0846    Clinical Impression Statement  Measurements updated which do show she has made some progress with overall strength and ROM but still has deficitis in these areas and lacks some endurance. Balance overall improving and she did good with higher level balance challenges today. O2 sats dropped to 88 after standing dynamic balance activites but rebounded to 94 quickly with pursed lip breathing and she did not need to put on her supplemental O2. She was encouraged to look into joining a gym to transition her program there at the end of PT.    Comorbidities  Chronic Kidney disease, Fibromyalgia, HTN, IBS, LBP, neuropathy,    Rehab Potential  Good    PT Frequency  2x / week    PT Treatment/Interventions  ADLs/Self Care Home Management;Aquatic Therapy;Cryotherapy;Electrical Stimulation;Moist Heat;Traction;Ultrasound;DME Instruction;Gait training;Stair training;Functional mobility training;Therapeutic activities;Therapeutic exercise;Balance training;Neuromuscular re-education;Patient/family education;Manual techniques;Passive range of motion;Dry needling;Vestibular;Spinal Manipulations;Joint Manipulations    PT Next Visit Plan  she has not received her rollator yet.  check remaining 3 STGs.    PT Home Exercise Plan  Access Code: [email or web address]    Consulted and Agree with  Plan of Care  Patient       Patient will benefit from skilled therapeutic intervention in order to improve the following deficits and impairments:  Visit Diagnosis: Muscle weakness (generalized)  Unsteadiness on feet  Other abnormalities of gait and mobility  Chronic midline low back pain without sciatica  Cervicalgia     Problem List Patient Active Problem List   Diagnosis Date Noted  . Vision blurring 09/17/2018  . Fall 06/30/2018  . Frequent urination at night 04/23/2018  . Chronic cough 03/04/2018  . Dental infection 12/05/2017  . Right arm pain 12/05/2017  . Chronic respiratory failure with hypoxia ([place])   . Low serum vitamin B12 05/02/2017  . Palpitations 04/11/2017  . Nutritional anemia 06/17/2016  . Allergic rhinitis 06/12/2016  . Vitamin D deficiency, unspecified 06/12/2016  . Nicotine dependence 06/12/2016  . Menorrhagia 01/08/2016  . Migraines 01/08/2016  . Bilateral leg pain 11/29/2015  . Hemorrhoids 07/05/2015  . Fibromyalgia 07/05/2015  . Chronic leg pain 09/26/2014  . Healthcare maintenance 09/26/2014  . Morbid obesity ([place]) 07/04/2014  . Major depressive disorder, recurrent episode, moderate ([place]) 10/13/2012  . Generalized anxiety disorder 10/13/2012  . Chronic nausea 05/10/2010  . IBS (irritable bowel syndrome) 06/16/2007  . Essential hypertension 02/06/2006  . HIDRADENITIS 11/18/2005    Silvestre Mesi 10/21/2018, 9:23 AM  [facility] [address] [place], [place], [address] Phone: [phone]   Fax:  [phone]  Name: Lindsay [name] MRN: [id number] Date of Birth: 01-30-1970

## 2018-10-26 ENCOUNTER — Ambulatory Visit: Payer: Medicare Other | Admitting: Physical Therapy

## 2018-10-26 DIAGNOSIS — J9620 Acute and chronic respiratory failure, unspecified whether with hypoxia or hypercapnia: Secondary | ICD-10-CM | POA: Diagnosis not present

## 2018-10-27 ENCOUNTER — Other Ambulatory Visit: Payer: Self-pay | Admitting: Physical Medicine and Rehabilitation

## 2018-10-27 DIAGNOSIS — M5136 Other intervertebral disc degeneration, lumbar region: Secondary | ICD-10-CM

## 2018-10-28 ENCOUNTER — Ambulatory Visit: Payer: Medicare Other | Admitting: Physical Therapy

## 2018-10-28 DIAGNOSIS — M940 Chondrocostal junction syndrome [Tietze]: Secondary | ICD-10-CM | POA: Diagnosis not present

## 2018-10-28 DIAGNOSIS — M542 Cervicalgia: Secondary | ICD-10-CM | POA: Diagnosis not present

## 2018-10-28 DIAGNOSIS — G894 Chronic pain syndrome: Secondary | ICD-10-CM | POA: Diagnosis not present

## 2018-10-28 DIAGNOSIS — M545 Low back pain: Secondary | ICD-10-CM | POA: Diagnosis not present

## 2018-10-30 ENCOUNTER — Other Ambulatory Visit: Payer: Self-pay | Admitting: Pulmonary Disease

## 2018-11-02 ENCOUNTER — Ambulatory Visit: Payer: Medicare Other | Admitting: Family Medicine

## 2018-11-02 ENCOUNTER — Ambulatory Visit: Payer: Medicare Other | Admitting: Physical Therapy

## 2018-11-03 ENCOUNTER — Ambulatory Visit: Payer: Medicare Other | Admitting: Physical Therapy

## 2018-11-04 ENCOUNTER — Ambulatory Visit (INDEPENDENT_AMBULATORY_CARE_PROVIDER_SITE_OTHER): Payer: Medicare Other | Admitting: Psychiatry

## 2018-11-04 ENCOUNTER — Other Ambulatory Visit: Payer: Self-pay

## 2018-11-04 ENCOUNTER — Ambulatory Visit: Payer: Medicare Other | Admitting: Family Medicine

## 2018-11-04 ENCOUNTER — Ambulatory Visit: Payer: Medicare Other | Admitting: Physical Therapy

## 2018-11-04 ENCOUNTER — Encounter (HOSPITAL_COMMUNITY): Payer: Self-pay | Admitting: Psychiatry

## 2018-11-04 DIAGNOSIS — F331 Major depressive disorder, recurrent, moderate: Secondary | ICD-10-CM

## 2018-11-04 DIAGNOSIS — F411 Generalized anxiety disorder: Secondary | ICD-10-CM | POA: Diagnosis not present

## 2018-11-04 MED ORDER — REXULTI 3 MG PO TABS: 3.0000 mg | ORAL_TABLET | Freq: Every day | ORAL | 0 refills | Status: DC

## 2018-11-04 MED ORDER — CLORAZEPATE DIPOTASSIUM 15 MG PO TABS: ORAL_TABLET | ORAL | 0 refills | Status: DC

## 2018-11-04 MED ORDER — HYDROXYZINE PAMOATE 50 MG PO CAPS: ORAL_CAPSULE | ORAL | 0 refills | Status: DC
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
## 2018-11-05 ENCOUNTER — Ambulatory Visit (HOSPITAL_COMMUNITY): Payer: Medicare Other | Admitting: Licensed Clinical Social Worker

## 2018-11-05 NOTE — Addendum Note (Signed)
Addended by: Hulan Fray on: 11/05/2018 05:47 PM   Modules accepted: Orders

## 2018-11-09 ENCOUNTER — Ambulatory Visit: Payer: Medicare Other | Attending: Internal Medicine | Admitting: Physical Therapy

## 2018-11-10 ENCOUNTER — Ambulatory Visit: Payer: Medicare Other | Admitting: Gastroenterology

## 2018-11-11 ENCOUNTER — Ambulatory Visit: Payer: Medicare Other | Admitting: Physical Therapy

## 2018-11-12 ENCOUNTER — Ambulatory Visit (INDEPENDENT_AMBULATORY_CARE_PROVIDER_SITE_OTHER): Payer: Medicare Other | Admitting: Licensed Clinical Social Worker

## 2018-11-12 DIAGNOSIS — F331 Major depressive disorder, recurrent, moderate: Secondary | ICD-10-CM | POA: Diagnosis not present

## 2018-11-12 DIAGNOSIS — F411 Generalized anxiety disorder: Secondary | ICD-10-CM | POA: Diagnosis not present
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
## 2018-11-13 ENCOUNTER — Other Ambulatory Visit: Payer: Self-pay | Admitting: Pulmonary Disease

## 2018-11-18 ENCOUNTER — Encounter: Payer: Self-pay | Admitting: Family Medicine

## 2018-11-18 ENCOUNTER — Other Ambulatory Visit (HOSPITAL_COMMUNITY)
Admission: RE | Admit: 2018-11-18 | Discharge: 2018-11-18 | Disposition: A | Discharge status: Home / Self Care | Payer: Medicare Other | Source: Ambulatory Visit | Attending: Family Medicine | Admitting: Family Medicine

## 2018-11-18 ENCOUNTER — Ambulatory Visit (INDEPENDENT_AMBULATORY_CARE_PROVIDER_SITE_OTHER): Payer: Medicare Other | Admitting: Family Medicine

## 2018-11-18 ENCOUNTER — Other Ambulatory Visit: Payer: Self-pay

## 2018-11-18 VITALS — BP 143/97 | HR 86 | Wt 351.9 lb

## 2018-11-18 DIAGNOSIS — Z23 Encounter for immunization: Secondary | ICD-10-CM

## 2018-11-18 DIAGNOSIS — Z01419 Encounter for gynecological examination (general) (routine) without abnormal findings: Secondary | ICD-10-CM | POA: Insufficient documentation

## 2018-11-18 DIAGNOSIS — Z30017 Encounter for initial prescription of implantable subdermal contraceptive: Secondary | ICD-10-CM

## 2018-11-18 DIAGNOSIS — N921 Excessive and frequent menstruation with irregular cycle: Secondary | ICD-10-CM | POA: Insufficient documentation

## 2018-11-18 DIAGNOSIS — Z1151 Encounter for screening for human papillomavirus (HPV): Secondary | ICD-10-CM | POA: Diagnosis not present

## 2018-11-18 DIAGNOSIS — Z3046 Encounter for surveillance of implantable subdermal contraceptive: Secondary | ICD-10-CM

## 2018-11-18 MED ORDER — ACETAMINOPHEN 500 MG PO TABS
500.0000 mg | ORAL_TABLET | Freq: Once | ORAL | Status: AC
  Administered 2018-11-18: 10:00:00 500 mg via ORAL

## 2018-11-18 MED ORDER — ETONOGESTREL 68 MG ~~LOC~~ IMPL
68.0000 mg | DRUG_IMPLANT | Freq: Once | SUBCUTANEOUS | Status: AC
  Administered 2018-11-18: 68 mg via SUBCUTANEOUS

## 2018-11-18 NOTE — Progress Notes (Signed)
Subjective:    Patient ID: Lindsay [name] is a 48 y.o. female presenting with abnormal bleeding  on 11/18/2018  HPI: Here for f/u. She continues to have bleeding. First seen for same in 2016. Had Mirena, and then had that removed and changed to  Hardin in 03/16. Minimal bleeding which she could tolerate, but bleeding is worse. Due for change in 2019. Bleeding is worse over last few months. No u/s recently. Needs pap, flu, mammogram.  Review of Systems  Constitutional: Negative for chills and fever.  Respiratory: Negative for shortness of breath.   Cardiovascular: Negative for chest pain.  Gastrointestinal: Negative for abdominal pain, nausea and vomiting.  Genitourinary: Negative for dysuria.  Skin: Negative for rash.      Objective:    BP (!) 143/97   Pulse 86   Wt (!) 351 lb 14.4 oz (159.6 kg)   LMP 11/16/2018 (LMP Unknown)   BMI 49.08 kg/m  Physical Exam Exam conducted with a chaperone present.  Constitutional:      Appearance: She is well-developed.  HENT:     Head: Normocephalic and atraumatic.  Eyes:     General: No scleral icterus.    Pupils: Pupils are equal, round, and reactive to light.  Neck:     Musculoskeletal: Normal range of motion.     Thyroid: No thyromegaly.  Cardiovascular:     Rate and Rhythm: Normal rate and regular rhythm.     Heart sounds: No murmur.  Pulmonary:     Effort: Pulmonary effort is normal.     Breath sounds: Normal breath sounds.  Chest:     Breasts:        Right: Normal. No inverted nipple or mass.        Left: Normal. No inverted nipple or mass.  Abdominal:     General: There is no distension.     Palpations: Abdomen is soft.     Tenderness: There is no abdominal tenderness.  Genitourinary:    General: Normal vulva.     Vagina: Normal.     Cervix: Normal.     Uterus: Normal.      Adnexa: Right adnexa normal and left adnexa normal.     Comments: Limited by body habitus Lymphadenopathy:     Upper Body:     Right  upper body: No supraclavicular or axillary adenopathy.     Left upper body: No supraclavicular or axillary adenopathy.  Skin:    General: Skin is warm and dry.  Neurological:     General: No focal deficit present.     Mental Status: She is alert and oriented to person, place, and time.      Procedure: Patient given informed consent, signed copy in the chart, time out was performed. Appropriate time out taken. . The patient was placed in the lithotomy position and the cervix brought into view with sterile speculum.  Portio of cervix cleansed x 2 with betadine swabs.  A tenaculum was placed in the anterior lip of the cervix.  The uterus was sounded for depth of 8 cm. A pipelle was introduced to into the uterus, suction created,  and an endometrial sample was obtained. All equipment was removed and accounted for.  The patient tolerated the procedure well.   Procedure: Patient given informed consent for removal of her Nexplanon.   Appropriate time out taken. Nexplanon site identified.  Area prepped in usual sterile fashon. Three cc of 1% lidocaine was used to anesthetize the area at the  distal end of the implant. A small stab incision was made right beside the implant on the distal portion.  The Nexplanon rod was grasped using hemostats and removed without difficulty.  Nexplanon removed form packaging,  Device confirmed in needle, then inserted full length of needle and withdrawn per handbook instructions. A small amount of antibiotic ointment and steri-strips were applied over the small incision.  A pressure bandage was applied to reduce any bruising.  The patient tolerated the procedure well and was given post procedure instructions. Minimal blood loss.  Pt tolerated the procedure well.   Assessment & Plan:   Problem List Items Addressed This Visit      Unprioritized   Menorrhagia    Await biopsy and u/s results. Hopefully Nexplanon will keep bleeding to a minimum.       Relevant Orders    Surgical pathology( [facility]/ [facility])   US PELVIC COMPLETE WITH TRANSVAGINAL    Other Visit Diagnoses    Well woman exam    -  Primary   Relevant Medications   acetaminophen (TYLENOL) tablet 500 mg (Completed)   Other Relevant Orders   MM DIGITAL SCREENING BILATERAL   Cytology - PAP( [facility])   Encounter for removal and reinsertion of Nexplanon          Total face-to-face time with patient: 25 minutes. Over 50% of encounter was spent on counseling and coordination of care. Return in 3 months (on 02/18/2019) for schedule mammgram, pelvic sono, a follow-up, in person.  Lindsay [name] 11/18/2018 11:57 AM

## 2018-11-18 NOTE — Assessment & Plan Note (Signed)
Await biopsy and u/s results. Hopefully Nexplanon will keep bleeding to a minimum.

## 2018-11-18 NOTE — Patient Instructions (Signed)

## 2018-11-19 ENCOUNTER — Other Ambulatory Visit: Payer: Medicare Other

## 2018-11-19 ENCOUNTER — Other Ambulatory Visit: Payer: Self-pay | Admitting: Gastroenterology

## 2018-11-19 ENCOUNTER — Encounter: Payer: Self-pay | Admitting: Physical Therapy

## 2018-11-19 ENCOUNTER — Encounter

## 2018-11-19 LAB — SURGICAL PATHOLOGY

## 2018-11-19 NOTE — Therapy (Signed)
[place] [address] [place], [place], [address] Phone: [phone]   Fax:  [phone]  Patient Details  Name: Varsha Knock MRN: [id number] Date of Birth: 1970/07/23 Referring Provider:  Gilles Chiquito, MD  Encounter Date: 11/19/2018  PHYSICAL THERAPY DISCHARGE SUMMARY  Visits from Start of Care: 15  Current functional level related to goals / functional outcomes: See last PT note on 10/21/2018.  She cancelled last 2 weeks of scheduled appointments due to not feeling well. Her Medicaid authorization period ended at this time.    Remaining deficits: Patient continues to have chronic pain issues limiting her mobility.  She is able to tolerate community based gait with rollator walker as this enables rest periods. She requires a tall adult rollator walker due to her height and a standard height version would result in poor posture and thereby increase her pain & balance issues.  A tall adult rollator walker prescription was written. [employer] is new company that replaced [employer] and only known company in area that will take & bill Medicaid. Per patient report, the rollator walker has been on back order since submitted the end of July. She reports regular phone calls to check on status but is told it's on order.  Not having this equipment significantly limits her mobility & adds to her deconditioning.     Education / Equipment: HEP, see note above for tall adult rollator walker status.   Plan: Patient agrees to discharge.  Patient goals were not met. Patient is being discharged due to                                                    [facility] authorization expired. Jamey Reas PT, DPT 11/19/2018, 8:01 AM  [place] [address] [place], [place], [address] Phone: [phone]   Fax:  [phone]

## 2018-11-20 LAB — CYTOLOGY - PAP
Comment: NEGATIVE
Diagnosis: NEGATIVE
High risk HPV: NEGATIVE

## 2018-11-24 ENCOUNTER — Ambulatory Visit (INDEPENDENT_AMBULATORY_CARE_PROVIDER_SITE_OTHER): Payer: Medicare Other | Admitting: Gastroenterology

## 2018-11-24 ENCOUNTER — Other Ambulatory Visit: Payer: Self-pay

## 2018-11-24 ENCOUNTER — Encounter: Payer: Self-pay | Admitting: Gastroenterology

## 2018-11-24 VITALS — BP 142/88 | HR 100 | Temp 98.1°F | Ht 70.0 in | Wt 355.6 lb

## 2018-11-24 DIAGNOSIS — Z860100 Personal history of colon polyps, unspecified: Secondary | ICD-10-CM

## 2018-11-24 NOTE — Progress Notes (Signed)
Review of pertinent gastrointestinal problems: 1. Adenomatous polyps in her colon;  colonoscopy in 07/2007 with one polyp that was removed and was a hyperplastic polyp; also had hemorrhoids. Colonoscopy  04/2015 Dr. Ardis Hughs (minor rectal bleeding); this was an incomplete colonoscopy 2 to BMI 50, I was only able to get to the ascending colon; 4 polyps were removed, 3 of them were adenomatous.  My office arranged barium enema but she never had that done. 2.  Dyspepsia led to upper endoscopy May 2012.  Minor gastritis was noted, biopsies showed no sign of H. Pylori.  Ultrasound June 2012 was normal.  HIDA scan June 2012 was also normal.  CT scan August 2012 was essentially normal except for "prominent stool"   HPI: This is a very pleasant 48 year old woman whom I last saw 9 months ago.  I last saw her January 2020.  At that time we discussed repeat colonoscopy for her personal history of precancerous colon polyps.  Given her morbid obesity and her chronic respiratory issues we decided it was safest to proceed with this at the hospital.  Since then she has gained 16 pounds and , her BMI is now greater than 50  She has had no changes in her bowels in the interim.  She is still on chronic oxygen therapy at 4 L nasal cannula 24 hours a day.    Chief complaint is personal history of adenomatous polyps  ROS: complete GI ROS as described in HPI, all other review negative.  Constitutional:  No unintentional weight loss   Past Medical History:  Diagnosis Date  . Acid reflux   . Anemia    Iron Def  . Anorexia   . Chronic kidney disease    Nephrotic syndrome  . Colon polyp 2009  . Depression with anxiety   . Edema leg   . Fibromyalgia   . Hemorrhoids   . Hidradenitis suppurativa   . Hypertension   . IBS (irritable bowel syndrome)   . Low back pain   . Migraines   . Morbidly obese ([place])   . Neuromuscular disorder (HCC)    fibromyalgia  . Neuropathy   . Panic attacks   . Polyp of vocal cord  or larynx   . Tonsil pain     Past Surgical History:  Procedure Laterality Date  . AXILLARY HIDRADENITIS EXCISION    . COLONOSCOPY WITH PROPOFOL N/A 05/25/2015   Procedure: COLONOSCOPY WITH PROPOFOL;  Surgeon: Milus Banister, MD;  Location: [facility];  Service: Endoscopy;  Laterality: N/A;  . HEMORRHOID SURGERY     with Hidradenitis surgery   . INGUINAL HIDRADENITIS EXCISION    . TONSILLECTOMY  10/18/2010   by Dr. Wilburn Cornelia  . UPPER GASTROINTESTINAL ENDOSCOPY      Current Outpatient Medications  Medication Sig Dispense Refill  . albuterol (PROVENTIL) (2.5 MG/3ML) 0.083% nebulizer solution Take 3 mLs (2.5 mg total) by nebulization every 4 (four) hours. 75 mL 12  . albuterol (VENTOLIN HFA) 108 (90 Base) MCG/ACT inhaler TAKE 2 PUFFS BY MOUTH EVERY 6 HOURS AS NEEDED FOR WHEEZE OR SHORTNESS OF BREATH 18 g 0  . amLODipine (NORVASC) 10 MG tablet TAKE 1/2 TABLET BY MOUTH EVERY DAY 45 tablet 0  . Brexpiprazole (REXULTI) 3 MG TABS Take 1 tablet (3 mg total) by mouth daily. 90 tablet 0  . budesonide-formoterol (SYMBICORT) 160-4.5 MCG/ACT inhaler Inhale 2 puffs into the lungs 2 (two) times daily. 1 Inhaler 0  . Capsaicin 0.075 % GEL Apply 1 application topically 4 (  four) times daily as needed. 30 g 0  . CHANTIX CONTINUING MONTH PAK 1 MG tablet TAKE 1 TABLET BY MOUTH TWICE A DAY 56 tablet 0  . cholecalciferol (VITAMIN D) 1000 units tablet Take 1 tablet (1,000 Units total) by mouth daily. 30 tablet 3  . clorazepate (TRANXENE) 15 MG tablet TAKE 1 TABLET BY MOUTH EVERY DAY IN THE MORNING 90 tablet 0  . DULoxetine (CYMBALTA) 60 MG capsule TAKE 1 CAPSULE BY MOUTH TWICE A DAY (Patient taking differently: Take 60 mg by mouth 2 (two) times daily. ) 180 capsule 1  . hydrOXYzine (VISTARIL) 50 MG capsule Take one capsule daily as needed for anxiety 90 capsule 0  . iron polysaccharides (NU-IRON) 150 MG capsule Take 1 capsule (150 mg total) by mouth daily. 30 capsule 5  . Multiple Vitamin (MULTIVITAMIN WITH  MINERALS) TABS tablet Take 1 tablet by mouth daily.    [name] omeprazole (PRILOSEC) 40 MG capsule Take 1 capsule (40 mg total) by mouth daily. 30 capsule 11  . OXYGEN Inhale 4 L into the lungs.    . pregabalin (LYRICA) 100 MG capsule TAKE 1 CAPSULE BY MOUTH THREE TIMES A DAY (Patient taking differently: Take 100 mg by mouth 3 (three) times daily. ) 90 capsule 2  . promethazine (PHENERGAN) 12.5 MG tablet TAKE 1 TABLET (12.5 MG TOTAL) BY MOUTH EVERY 6 (SIX) HOURS AS NEEDED FOR NAUSEA OR VOMITING. 30 tablet 1  . valsartan (DIOVAN) 160 MG tablet Take 1 tablet (160 mg total) by mouth daily. 90 tablet 3  . vitamin B-12 (CYANOCOBALAMIN) 1000 MCG tablet Take 1 tablet (1,000 mcg total) by mouth daily. 30 tablet 3  . vitamin C (ASCORBIC ACID) 500 MG tablet Take 500 mg by mouth daily.     Current Facility-Administered Medications  Medication Dose Route Frequency Provider Last Rate Last Dose  . ferumoxytol [facility]) injection 510 mg  510 mg Intravenous Once Lars Mage, MD        Allergies as of 11/24/2018 - Review Complete 11/24/2018  Allergen Reaction Noted  . Lisinopril Cough 11/06/2015    Family History  Problem Relation Age of Onset  . Diabetes Mother   . Heart disease Mother        valve leak  . Anxiety disorder Mother   . Depression Mother   . High blood pressure Mother   . Kidney failure Mother   . Cancer Father        prostate  . Heart disease Father   . Learning disabilities Sister   . Depression Sister   . Depression Sister   . Anxiety disorder Sister   . Colon cancer Neg Hx     Social History   Socioeconomic History  . Marital status: Single    Spouse name: Not on file  . Number of children: 1  . Years of education: 57  . Highest education level: Not on file  Occupational History  . Occupation: Disabled  Social Needs  . Financial resource strain: Not on file  . Food insecurity    Worry: Not on file    Inability: Not on file  . Transportation needs    Medical: Not  on file    Non-medical: Not on file  Tobacco Use  . Smoking status: Current Some Day Smoker    Packs/day: 0.50    Years: 25.00    Pack years: 12.50    Types: Cigarettes  . Smokeless tobacco: Never Used  . Tobacco comment: 1 cigeratte a day now  Substance and Sexual Activity  . Alcohol use: No    Alcohol/week: 0.0 standard drinks    Comment: occassionally  . Drug use: No  . Sexual activity: Not Currently    Birth control/protection: Implant  Lifestyle  . Physical activity    Days per week: Not on file    Minutes per session: Not on file  . Stress: Not on file  Relationships  . Social Herbalist on phone: Not on file    Gets together: Not on file    Attends religious service: Not on file    Active member of club or organization: Not on file    Attends meetings of clubs or organizations: Not on file    Relationship status: Not on file  . Intimate partner violence    Fear of current or ex partner: Not on file    Emotionally abused: Not on file    Physically abused: Not on file    Forced sexual activity: Not on file  Other Topics Concern  . Not on file  Social History Narrative   Lives at home w/ her mother and daughter   Right-handed   Caffeine: about 3 Cokes per week     Physical Exam: BP (!) 142/88   Pulse 100   Temp 98.1 F (36.7 C)   Ht 5\' 10"  (1.778 m)   Wt (!) 355 lb 9.6 oz (161.3 kg)   LMP 11/16/2018 (LMP Unknown)   BMI 51.02 kg/m  Constitutional: generally well-appearing Psychiatric: alert and oriented x3 Abdomen: soft, nontender, nondistended, no obvious ascites, no peritoneal signs, normal bowel sounds No peripheral edema noted in lower extremities  Assessment and plan: 48 y.o. female with personal history of adenomatous polyps, morbid obesity, chronic oxygen requirement  I recommended we proceed with a colonoscopy at her soonest convenience to be done at the hospital given her morbid obesity as well as her oxygen requiring respiratory  failure.  She had difficult time with previous GoLYTELY prep and so we will use a split dose MiraLAX, Gatorade prep instead this time.  I see no reason for any further blood tests or imaging studies prior to then.  Please see the "Patient Instructions" section for addition details about the plan.  Owens Loffler, MD [facility] 11/24/2018, 9:49 AM

## 2018-11-24 NOTE — Patient Instructions (Signed)
Due to recent COVID-19 restrictions implemented by our local and state authorities and in an effort to keep both patients and staff as safe as possible, our hospital system now requires COVID-19 testing prior to any scheduled hospital procedure. Please go to our [facility] location drive thru testing site ([address] [place], [place] [address]) on 12/28/18 at  9am. There will be multiple testing areas, the first checkpoint being for pre-procedure/surgery testing. Get into the right (yellow) lane that leads to the PAT testing team. You will not be billed at the time of testing but may receive a bill later depending on your insurance. The approximate cost of the test is $100. You must agree to quarantine from the time of your testing until the procedure date on 01/02/2019 . This should include staying at home with ONLY the people you live with. Avoid take-out, grocery store shopping or leaving the house for any non-emergent reason. Failure to have your COVID-19 test done on the date and time you have been scheduled will result in cancellation of procedure. Please call our office at [phone] if you have any questions.   You have been scheduled for a colonoscopy. Please follow written instructions given to you at your visit today.  Please pick up your prep supplies at the pharmacy within the next 1-3 days. If you use inhalers (even only as needed), please bring them with you on the day of your procedure. Your physician has requested that you go to www.startemmi.com and enter the access code given to you at your visit today. This web site gives a general overview about your procedure. However, you should still follow specific instructions given to you by our office regarding your preparation for the procedure.  Thank you for entrusting me with your care and choosing [facility].  Dr Ardis Hughs

## 2018-11-25 ENCOUNTER — Other Ambulatory Visit: Payer: Self-pay | Admitting: Pulmonary Disease

## 2018-11-25 DIAGNOSIS — R05 Cough: Secondary | ICD-10-CM

## 2018-11-25 DIAGNOSIS — J9620 Acute and chronic respiratory failure, unspecified whether with hypoxia or hypercapnia: Secondary | ICD-10-CM | POA: Diagnosis not present

## 2018-11-25 DIAGNOSIS — R059 Cough, unspecified: Secondary | ICD-10-CM

## 2018-11-27 ENCOUNTER — Encounter: Payer: Self-pay | Admitting: *Deleted

## 2018-11-27 ENCOUNTER — Other Ambulatory Visit: Payer: Self-pay | Admitting: Internal Medicine

## 2018-11-27 NOTE — Telephone Encounter (Signed)
This med was removed by dr Kennon Rounds on 10/21 due to pt not taking

## 2018-11-30 ENCOUNTER — Ambulatory Visit (INDEPENDENT_AMBULATORY_CARE_PROVIDER_SITE_OTHER): Payer: Medicare Other | Admitting: Licensed Clinical Social Worker

## 2018-11-30 DIAGNOSIS — F331 Major depressive disorder, recurrent, moderate: Secondary | ICD-10-CM

## 2018-11-30 DIAGNOSIS — F411 Generalized anxiety disorder: Secondary | ICD-10-CM | POA: Diagnosis not present

## 2018-11-30 NOTE — Progress Notes (Signed)
Virtual Visit via Telephone Note  I connected with Lindsay [name] on 11/30/18 at  4:00 PM EST by telephone and verified that I am speaking with the correct person using two identifiers.   I discussed the limitations, risks, security and privacy concerns of performing an evaluation and management service by telephone and the availability of in person appointments. I also discussed with the patient that there may be a patient responsible charge related to this service. The patient expressed understanding and agreed to proceed.  The patient was provided an opportunity to ask questions and all were answered. The patient agreed with the plan and demonstrated an understanding of the instructions.   The patient was advised to call back or seek an in-person evaluation if the symptoms worsen or if the condition fails to improve as anticipated.  I provided 52 minutes of non-face-to-face time during this encounter.   THERAPIST PROGRESS NOTE  Session Time: 4:05 PM to 4:57 PM  Participation Level: Active  Behavioral Response: AlertAnxious  Type of Therapy: Individual Therapy  Treatment Goals addressed:  Anxiety,Depression, self-esteem, coping  Interventions: Solution Focused, Strength-based, Supportive, Reframing and Other: addressing trauma symptoms, providing education on trauma, coping  Summary: Lindsay [name] is a 48 y.o. female who presents with not doing all right.  Since October 21 things have been a blur.  Reviewed difficulty with procedure of endometrial biopsy.  Patient describes she was not at the house and got a call from daughter who shared police had searched house.  Patient shares stress related to not being there.  Other stress related to not knowing how this came about.  Treating daughter she was a kingpin.  They had been searching 3 months in the garbage to get enough information to have a search for it.  Patient relates having marijuana, insignificant amounts anxiety about whether  she will be charged.  Have not been charged and talk to an attorney whose advice was to let things calm down and pointing out no charges have been filed.  Patient describes it is like a nightmare that she cannot wake up from.  Gust the stressors well being African-American and problems with the police that increases the stress and anxiety.  Therapist validated patient on it being very scary experience. Patient shares she has been having nightmares more than she was before talking in her sleep but now crying out for her daughter.  Hard for her to get to sleep.  Therapist reviewed some exercises that could help her with trauma.  Guided her in exercise that she could follow as a ritual before sleep to prepare for any nightmares that may occur.  The exercise helping her with dual awareness, grounding techniques when she wakes up.  Viewed options of running down her nightmare talking it through and rewriting its ending as a means to take control of dream.  Discussed important to look and try to understand the content of your nightmares.  In therapy to examine the meaning behind the nightmares and can give you a safe place to release associated emotions.  Therapist went on to do that with patient and patient describes moving around, mom's boyfriend in the dream (source of trauma) a lot of moving around moving from old to new neighborhood.  Reviewed not feeling comfortable in house and recent events have escalated anxiety.  Therapist reviewed recent event was probably a trigger for her past trauma.  In trauma her sense of safety is violated and recent event caused that to surface.  Reviewed  and trauma we have distorted ways of viewing the world and trauma work will help her see distortions such as there is about safety but it is a process trauma work that helps [name] to rewrite our narratives for more accurate and helpful perspectives.  Reviewed we still can work on managing triggers until ready when emotions are regulated  to work on trauma.  Therapist utilized intervention of having patient talk about nightmares having patient a safe place to release associated emotions.  Working on exploring useful sources of information that have an obvious and messages.  Dealing with nightmares to help reduce some of the hyperarousal and hypervigilance by desensitizing her to the content.  Discussed emotional regulation strategies in general that self-awareness leads to better self-management strategies.  1 thing to become aware as is her triggers, triggers related to trauma symptoms then we are better prepared with strategies to manage.  In this session we managed trigger by helping her with some strategies to help her de-escalate when she has nightmare.  This reviewed session with patient emphasizing importance of grounding to addressed symptoms.  Patient went on to share scared going out, afraid of going to jail been in jail thinking about it makes her heart start racing.  Something does not sit right looking at her trash.  Therapist directed patient to think about maybe never knowing the answer and also focusing on what she can control is effective way to direct her energy. It was also processed with patient feelings related to cousin whose place was rated by police as well then cross examined daughter who has no idea what the reasons for what is happening.  Thought him like a brother and now rethinking that relationship and this is something that adds to stressful events for her. Therapist provided strength based and supportive intervention  Suicidal/Homicidal: No  Plan: Return again in 1-2 weeks.2.Marland Kitchencoping strategies to address stressors, depression, anxiety, self-esteem, trauma  Diagnosis: Axis I:   major depressive disorder, recurrent, moderate, generalized anxiety disorder    Axis II: No diagnosis    [name], LCSW 11/30/2018

## 2018-12-02 DIAGNOSIS — J9621 Acute and chronic respiratory failure with hypoxia: Secondary | ICD-10-CM | POA: Diagnosis not present

## 2018-12-08 ENCOUNTER — Telehealth: Payer: Self-pay | Admitting: Internal Medicine

## 2018-12-08 DIAGNOSIS — G8929 Other chronic pain: Secondary | ICD-10-CM

## 2018-12-08 NOTE — Telephone Encounter (Signed)
Pt is requesting a New Referral be placed to [facility].  Pt states her old one has now expired.  Pt is requesting to continue her PT with [facility] to help with the New Rollator Walker they ordered for her.  Please Advise.

## 2018-12-08 NOTE — Telephone Encounter (Signed)
Pt is requesting a call back about pain in her legs and feet.  Pt states she was referred by [facility] currently sees Dr. Primus Bravo at the [facility]. Patient states she would like for someone to call her back about her pain. Requested records from Dr. Milana Na office and they have been received.  Patient also reporting she is unable to sleep and is waking up.  Pt also Currently see [facility].  Please call patient back.

## 2018-12-08 NOTE — Telephone Encounter (Signed)
Pt is

## 2018-12-08 NOTE — Telephone Encounter (Signed)
Called pt - informed her doctor has placed a new [facility] referral. Informed she needs to schedule an appt to discuss her other issues - agreed to a telehealth appt for tomorrow with Dr Philipp Ovens @ [phone] AM, informed to have her phone close by.

## 2018-12-08 NOTE — Telephone Encounter (Signed)
I have reviewed the patient's pain clinic note from Dr. Wyline Copas. He requested she follow up with her psychiatrist about her chronic benzodiazepine use prior to initiating opioid therapy. He prefers she be tapered off first, but if psychiatry wants to continue it he requested documentation from them that it is an essential medication for the patient. I think this is a completely reasonable and appropriate request. I agree with his assessment and plan. I do not feel that another referral is appropriate right now. She needs to discuss her concerns with Dr. Wyline Copas and follow up with her psychiatrist as instructed.   I have placed another referral to neuro rehab. Please have her schedule an appointment for her other concerns. Thank you!

## 2018-12-08 NOTE — Telephone Encounter (Signed)
Called pt - stated she does not want to see Dr Primus Bravo at the pain clinic "they have not done anything for me" ; requesting a new referral somewhere else. Also stated she had called physical therapy, she was told she needs a new referral. Received notes from pain clinic per [place]; I will put in Dr Rivka Safer box to review. Thanks

## 2018-12-09 ENCOUNTER — Encounter: Payer: Self-pay | Admitting: Internal Medicine

## 2018-12-09 ENCOUNTER — Ambulatory Visit (INDEPENDENT_AMBULATORY_CARE_PROVIDER_SITE_OTHER): Payer: Medicare Other | Admitting: Internal Medicine

## 2018-12-09 ENCOUNTER — Other Ambulatory Visit: Payer: Self-pay

## 2018-12-09 DIAGNOSIS — R0602 Shortness of breath: Secondary | ICD-10-CM

## 2018-12-09 DIAGNOSIS — J9611 Chronic respiratory failure with hypoxia: Secondary | ICD-10-CM

## 2018-12-09 DIAGNOSIS — R002 Palpitations: Secondary | ICD-10-CM

## 2018-12-09 DIAGNOSIS — Z8639 Personal history of other endocrine, nutritional and metabolic disease: Secondary | ICD-10-CM

## 2018-12-09 DIAGNOSIS — M79604 Pain in right leg: Secondary | ICD-10-CM

## 2018-12-09 DIAGNOSIS — M549 Dorsalgia, unspecified: Secondary | ICD-10-CM | POA: Diagnosis not present

## 2018-12-09 DIAGNOSIS — M79605 Pain in left leg: Secondary | ICD-10-CM | POA: Diagnosis not present

## 2018-12-09 DIAGNOSIS — F1290 Cannabis use, unspecified, uncomplicated: Secondary | ICD-10-CM

## 2018-12-09 DIAGNOSIS — Z79899 Other long term (current) drug therapy: Secondary | ICD-10-CM

## 2018-12-09 DIAGNOSIS — Z6841 Body Mass Index (BMI) 40.0 and over, adult: Secondary | ICD-10-CM

## 2018-12-09 DIAGNOSIS — G8929 Other chronic pain: Secondary | ICD-10-CM

## 2018-12-09 DIAGNOSIS — M797 Fibromyalgia: Secondary | ICD-10-CM

## 2018-12-09 DIAGNOSIS — F419 Anxiety disorder, unspecified: Secondary | ICD-10-CM

## 2018-12-09 DIAGNOSIS — M5030 Other cervical disc degeneration, unspecified cervical region: Secondary | ICD-10-CM

## 2018-12-09 DIAGNOSIS — Z862 Personal history of diseases of the blood and blood-forming organs and certain disorders involving the immune mechanism: Secondary | ICD-10-CM

## 2018-12-09 NOTE — Assessment & Plan Note (Addendum)
Overall difficult situation. Patient continues to have uncontrolled chronic leg and back pain that is likely multifactorial. She has a history of iron deficiency requiring IV repletion, vitamin B12 deficiency, and vitamin D deficiency. She also has fibromyalgia, is morbidly obese with a BMI of 51, and degenerative changes of her cervical spine seen on CT. Her adherence to our treatment plans have been questionable. We have tried numerous non opioid therapies without relief. I eventually referred her to a pain clinic where she has been seen multiple times. I have only received one note from them that appears to be her first visit. Will recheck the records that were sent and request the remaining visits be faxed over. Patient is very frustrated with her care at the pain clinic and how she has been treated. Apparently her urine was positive for [facility] and reports she was scolded for this. She also complains there was an incident where her confidentiality was breeched in the hallway around other patients when the clinic was discussing her drug screen results. She was very upset by this and is requesting referral to a different pain clinic. I advised patient that our clinic and every clinic has the same policy regarding marijuana use and will not prescribe controlled substances if she is using. She admits to regular marijuana use now to control her pain but is agreeable to stop if started on opioid therapy. She has quit in the past, and due to her extended daily use, reportedly took months to clear from her system.   I am not opposed to starting the patient on an opioid therapy but had a long discussion with patient regarding expectations if we were to move forward with a pain contract. I worry about her morbid obesity and chronic hypoxic respiratory failure. She will need to demonstrate compliance with her vitamin supplementation, work on weight loss, and meet with our clinic dietition. I also suggested possible medical  therapy for her weight loss and the option of bariatric surgery.  At this point, patient's mobility is limited due to morbidly obesity and she is unable to exercise. We will need to come up with an effective plan for her weight loss, and potentially start a medication like Liraglutide. She has also been referred for possible spinal injections and follows with Dr. Mina Marble at [facility]. She is scheduled for MRI in the near future. She will need to follow up with him regularly as well and not miss her appointments (history of no shows). She has been compliant with her physical therapy and a new referral was recently placed to continue with this. With the understanding that it will take time for her urine to test negative for [facility], she will also need to have a negative urine drug screen within 6 months of starting any therapy. Last, she is at risk for fall and oversedation due to polypharmacy. She is prescribed a chronic benzodiazepine by her psychiatrist. Her pain clinic physician had requested a letter from him stating medical necessity which she said was provided. We may need to request a copy of this. She is also on chronic hydroxyzine for sleep and anxiety and lyrica for her chronic pain. She seems resistant to stopping hydroxyzine as she finds it effective. Seems unlikely that we will be able to taper off or discontinue any of these medications.  She needs to be seen in person for repeat labs, further discussion, and possible pain contract.   25 minutes were spent on this encounter.

## 2018-12-09 NOTE — Telephone Encounter (Signed)
Referral has been rec'd ad sent to [facility] for sch.

## 2018-12-09 NOTE — Progress Notes (Signed)
    This is a telephone encounter between [name] and Velna Ochs on 12/09/2018 for Chronic pain follow up. The visit was conducted with the patient located at home and Velna Ochs at [facility]. The patient's identity was confirmed using their DOB and current address. The patient has consented to being evaluated through a telephone encounter and understands the associated risks (an examination cannot be done and the patient may need to come in for an appointment) / benefits (allows the patient to remain at home, decreasing exposure to coronavirus). I personally spent 24 minutes on medical discussion.   CC: Chronic pain   HPI:  [name] is a 48 y.o. female with past medical history outlined below. Patient was contacted for a telehealth appointment for chronic pain. For the details of today's visit, please refer to the assessment and plan.   Review of Systems  Constitutional: Negative for chills and fever.  Respiratory: Positive for shortness of breath.   Cardiovascular: Positive for palpitations.  Musculoskeletal: Positive for back pain and joint pain.     Assessment & Plan:   See Encounters Tab for problem based charting.

## 2018-12-10 ENCOUNTER — Ambulatory Visit (HOSPITAL_COMMUNITY): Payer: Medicare Other | Admitting: Licensed Clinical Social Worker

## 2018-12-10 ENCOUNTER — Ambulatory Visit (INDEPENDENT_AMBULATORY_CARE_PROVIDER_SITE_OTHER): Payer: Medicare Other | Admitting: Licensed Clinical Social Worker

## 2018-12-10 DIAGNOSIS — F331 Major depressive disorder, recurrent, moderate: Secondary | ICD-10-CM | POA: Diagnosis not present

## 2018-12-10 DIAGNOSIS — F411 Generalized anxiety disorder: Secondary | ICD-10-CM | POA: Diagnosis not present

## 2018-12-10 NOTE — Progress Notes (Signed)
Virtual Visit via Telephone Note  I connected with Lindsay [name] on 12/10/18 at  9:00 AM EST by telephone and verified that I am speaking with the correct person using two identifiers.   I discussed the limitations, risks, security and privacy concerns of performing an evaluation and management service by telephone and the availability of in person appointments. I also discussed with the patient that there may be a patient responsible charge related to this service. The patient expressed understanding and agreed to proceed.   I discussed the assessment and treatment plan with the patient. The patient was provided an opportunity to ask questions and all were answered. The patient agreed with the plan and demonstrated an understanding of the instructions.   The patient was advised to call back or seek an in-person evaluation if the symptoms worsen or if the condition fails to improve as anticipated.  I provided 52 minutes of non-face-to-face time during this encounter.   THERAPIST PROGRESS NOTE  Session Time: 9:02 AM to 9:56 AM  Participation Level: Active  Behavioral Response: AlertAnxious and Irritable  Type of Therapy: Individual Therapy  Treatment Goals addressed:  Anxiety,Depression, self-esteem, coping  Interventions: Solution Focused, Strength-based, Supportive, Reframing and Other: skills for managing trauma symptoms  Summary: Lindsay [name] is a 48 y.o. female who presents with afraid to leave the house. Still experiencing symptoms from police raid. Shares that people  shouldn't be coming house to house. Talked about negative experiences with police in the past. Shares can't sleep, wake up and thinks she heard something such as back door and nothing there. Sees mom at night because she can't sleep. Has been ongoing symptoms. Reviewed patient being influence to be triggered based on past trauma, and negative experiences from police.  Patient related her anxiety is manifested different  ways.  Reports identified symptoms she described as hypervigilance, hyperarousal.  Patient shares also anxiety manifested in different ways for example worries about bills she withdraws.  Therapist pointed out these are different ways anxiety can manifest himself.  Therapist encouraged her not to avoid symptoms but use grounding to work through them helping her to decrease symptoms and store memory.  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  Patient right now going to doctors to address eye issues but once that is resolved she can get back to reading.  Patient describes another symptom of PTSD continues to have memory of moving.  Emphasized using grounding to help her through distressing memory and current distress that will help her get through it. Better understanding of grounding, spirituality, self-care.  Suicidal/Homicidal: No  Therapist Response: I offered supportive, open questions, active listening and provided psychoeducation to patient regarding the acute stress response, emphasize response is a transient condition and normalize the response.  Reviewed helpful grounding techniques and patient plan to do self-care.  Utilize strength-based intervention and highlighting patient's skills and hobbies to help her with self-care.  Discussed flashbacks are part of the healing process her brain trying to store the memory and When you feel ready, you might want to write down about the flashback or anxiety attack, and how you got through it. This will help to remind you that you did get through it, and can again.  Plan: Return again in 3-4 weeks.2. Therapist work with patient on managing PTSD symptoms, working through trauma, emotional  regulation, coping.3.Patient will apply strategies of grounding and self-care  Diagnosis: Axis I:  major depressive disorder, recurrent, moderate, generalized anxiety disorder    Axis II: No diagnosis    [name], LCSW 12/10/2018

## 2018-12-11 ENCOUNTER — Other Ambulatory Visit: Payer: Self-pay | Admitting: Pulmonary Disease

## 2018-12-14 NOTE — Telephone Encounter (Signed)
Dr. Ander Slade, please advise if you are okay with [name] refilling med for pt. Thanks!

## 2018-12-16 ENCOUNTER — Telehealth: Payer: Self-pay

## 2018-12-16 ENCOUNTER — Encounter: Payer: Self-pay | Admitting: *Deleted

## 2018-12-16 ENCOUNTER — Ambulatory Visit: Payer: Medicare Other | Admitting: Physical Therapy

## 2018-12-16 ENCOUNTER — Telehealth: Payer: Self-pay | Admitting: Gastroenterology

## 2018-12-16 NOTE — Telephone Encounter (Signed)
Pt left VM on nurse line stating she had an endometrial biopsy with Kennon Rounds, MD on 11/18/18. Pt reports cramping and bleeding since that time. States she tried to make an appt with Kennon Rounds, MD but there was no availability until January and she would like to be seen before that time. Pt requesting a call back.

## 2018-12-16 NOTE — Telephone Encounter (Signed)
Patient is calling saying that the transportation from medicare and other transportation places do not transport people to [place] testing areas. So she has to try and find her own funds/transportation to get there and she will not be able to have transportation until the beginning of the month. She needs to change COVID test and procedure to after 12/3.

## 2018-12-17 NOTE — Telephone Encounter (Signed)
Pt called front office and requested to be seen by Dr. Kennon Rounds as soon as possible. [place] office gave first available appt and pt requested to speak with clinical staff.  Pt reports continued heavy bleeding and cramping that has not stopped since endometrial biopsy on 11/18/18. Dr. Kennon Rounds notified of pt's symptoms and recommends pt come to after hours clinic on Monday, 12/21/18 4:30- 7:30. Pt states she will not have transportation at that time.   Discussed pt's situation with front office. Offered pt appt today in office with another provider; pt states she will be unable to come today. Pt states she will go to the ED or come to after hours clinic. Explained to pt she needs to be seen in the office and the ED is not appropriate for her symptoms. Pt states she will try to come to after hours clinic and has no other concerns at this time.

## 2018-12-21 NOTE — Telephone Encounter (Signed)
I spoke with the pt and she advised that she will keep appt as planned and is actively trying to find transportation to get the COVID test.  She will call back in a few days if she is unable to work out the transportation issue.

## 2018-12-22 ENCOUNTER — Telehealth: Payer: Self-pay | Admitting: Internal Medicine

## 2018-12-22 NOTE — Telephone Encounter (Signed)
Made appt of pt's choosing 12/4 at 0915 [facility], can not do before due to colonoscopy

## 2018-12-22 NOTE — Telephone Encounter (Signed)
Pt is requesting pain medicine, she is in pain. Pls contact [phone]

## 2018-12-22 NOTE — Telephone Encounter (Signed)
Her pain is chronic. We discussed it at her last tele health visit. She can either call her pain clinic, wait to follow up with me in January, or make an [facility] appointment. Thank you!

## 2018-12-26 DIAGNOSIS — J9620 Acute and chronic respiratory failure, unspecified whether with hypoxia or hypercapnia: Secondary | ICD-10-CM | POA: Diagnosis not present

## 2018-12-28 ENCOUNTER — Other Ambulatory Visit (HOSPITAL_COMMUNITY)
Admission: RE | Admit: 2018-12-28 | Discharge: 2018-12-28 | Disposition: A | Discharge status: Home / Self Care | Payer: Medicare Other | Source: Ambulatory Visit | Attending: Gastroenterology | Admitting: Gastroenterology

## 2018-12-28 ENCOUNTER — Other Ambulatory Visit (HOSPITAL_COMMUNITY): Payer: Medicare Other

## 2018-12-28 ENCOUNTER — Ambulatory Visit: Payer: Medicare Other

## 2018-12-28 DIAGNOSIS — Z01812 Encounter for preprocedural laboratory examination: Secondary | ICD-10-CM | POA: Diagnosis not present

## 2018-12-28 DIAGNOSIS — Z20828 Contact with and (suspected) exposure to other viral communicable diseases: Secondary | ICD-10-CM | POA: Diagnosis not present

## 2018-12-29 ENCOUNTER — Other Ambulatory Visit: Payer: Medicare Other

## 2018-12-29 LAB — NOVEL CORONAVIRUS, NAA (HOSP ORDER, SEND-OUT TO REF LAB; TAT 18-24 HRS): SARS-CoV-2, NAA: NOT DETECTED

## 2018-12-30 ENCOUNTER — Other Ambulatory Visit: Payer: Self-pay

## 2018-12-30 ENCOUNTER — Encounter (HOSPITAL_COMMUNITY): Payer: Self-pay | Admitting: *Deleted

## 2018-12-30 ENCOUNTER — Other Ambulatory Visit: Payer: Self-pay | Admitting: Pulmonary Disease

## 2018-12-30 ENCOUNTER — Telehealth: Payer: Self-pay | Admitting: Gastroenterology

## 2018-12-30 DIAGNOSIS — R059 Cough, unspecified: Secondary | ICD-10-CM

## 2018-12-30 DIAGNOSIS — R05 Cough: Secondary | ICD-10-CM

## 2018-12-30 NOTE — Progress Notes (Signed)
   12/30/18 1417  OBSTRUCTIVE SLEEP APNEA  Have you ever been diagnosed with sleep apnea through a sleep study? No (was due to have a sleep study 2 months ago-waiting on appointment)  Do you snore loudly (loud enough to be heard through closed doors)?  1  Do you often feel tired, fatigued, or sleepy during the daytime (such as falling asleep during driving or talking to someone)? 1  Has anyone observed you stop breathing during your sleep? 1  Do you have, or are you being treated for high blood pressure? 1  BMI more than 35 kg/m2? 1  Age > 50 (1-yes) 0  Neck circumference greater than:Female 16 inches or larger, Female 17inches or larger? 1  Female Gender (Yes=1) 0  Obstructive Sleep Apnea Score 6  Score 5 or greater  Results sent to PCP

## 2018-12-30 NOTE — Telephone Encounter (Signed)
Yes, she should continue to prepare for the testing tomorrow.  Thanks

## 2018-12-30 NOTE — Telephone Encounter (Signed)
The pt is scheduled for colon tomorrow at [facility].  She is calling to make Dr Ardis Hughs aware that she has been taking BC powders and smoking marijuana.  She was advised that she could proceed with procedure as planned.  Dr Ardis Hughs is this ok?

## 2018-12-30 NOTE — Anesthesia Preprocedure Evaluation (Addendum)
Anesthesia Evaluation  Patient identified by MRN, date of birth, ID band Patient awake    Reviewed: Allergy & Precautions, NPO status , Patient's Chart, lab work & pertinent test results  Airway Mallampati: II  TM Distance: >3 FB Neck ROM: Full    Dental  (+) Dental Advisory Given, Poor Dentition, Missing, Chipped,    Pulmonary Current Smoker,    Pulmonary exam normal breath sounds clear to auscultation + decreased breath sounds      Cardiovascular hypertension, Pt. on medications Normal cardiovascular exam Rhythm:Regular Rate:Normal     Neuro/Psych  Headaches, PSYCHIATRIC DISORDERS Anxiety Depression    GI/Hepatic Neg liver ROS, GERD  ,  Endo/Other  Morbid obesity  Renal/GU Renal disease  negative genitourinary   Musculoskeletal  (+) Fibromyalgia -  Abdominal (+) + obese,   Peds negative pediatric ROS (+)  Hematology negative hematology ROS (+) anemia ,   Anesthesia Other Findings   Reproductive/Obstetrics negative OB ROS                            Anesthesia Physical  Anesthesia Plan  ASA: III  Anesthesia Plan: MAC   Post-op Pain Management:    Induction: Intravenous  PONV Risk Score and Plan:   Airway Management Planned: Simple Face Mask  Additional Equipment:   Intra-op Plan:   Post-operative Plan: Extubation in OR  Informed Consent: I have reviewed the patients History and Physical, chart, labs and discussed the procedure including the risks, benefits and alternatives for the proposed anesthesia with the patient or authorized representative who has indicated his/her understanding and acceptance.     Dental advisory given  Plan Discussed with: CRNA and Surgeon  Anesthesia Plan Comments:         Anesthesia Quick Evaluation

## 2018-12-31 ENCOUNTER — Encounter (HOSPITAL_COMMUNITY)
Admission: RE | Disposition: A | Discharge status: Home / Self Care | Payer: Self-pay | Source: Home / Self Care | Attending: Gastroenterology

## 2018-12-31 ENCOUNTER — Ambulatory Visit (HOSPITAL_COMMUNITY)
Admission: RE | Admit: 2018-12-31 | Discharge: 2018-12-31 | Disposition: A | Discharge status: Home / Self Care | Payer: Medicare Other | Attending: Gastroenterology | Admitting: Gastroenterology

## 2018-12-31 ENCOUNTER — Encounter (HOSPITAL_COMMUNITY): Payer: Self-pay | Admitting: *Deleted

## 2018-12-31 ENCOUNTER — Ambulatory Visit (HOSPITAL_COMMUNITY): Payer: Medicare Other | Admitting: Anesthesiology

## 2018-12-31 ENCOUNTER — Other Ambulatory Visit: Payer: Self-pay

## 2018-12-31 DIAGNOSIS — Z9981 Dependence on supplemental oxygen: Secondary | ICD-10-CM | POA: Insufficient documentation

## 2018-12-31 DIAGNOSIS — M797 Fibromyalgia: Secondary | ICD-10-CM | POA: Insufficient documentation

## 2018-12-31 DIAGNOSIS — Z8249 Family history of ischemic heart disease and other diseases of the circulatory system: Secondary | ICD-10-CM | POA: Insufficient documentation

## 2018-12-31 DIAGNOSIS — I129 Hypertensive chronic kidney disease with stage 1 through stage 4 chronic kidney disease, or unspecified chronic kidney disease: Secondary | ICD-10-CM | POA: Diagnosis not present

## 2018-12-31 DIAGNOSIS — I10 Essential (primary) hypertension: Secondary | ICD-10-CM | POA: Diagnosis not present

## 2018-12-31 DIAGNOSIS — Z841 Family history of disorders of kidney and ureter: Secondary | ICD-10-CM | POA: Diagnosis not present

## 2018-12-31 DIAGNOSIS — F418 Other specified anxiety disorders: Secondary | ICD-10-CM | POA: Insufficient documentation

## 2018-12-31 DIAGNOSIS — J9611 Chronic respiratory failure with hypoxia: Secondary | ICD-10-CM | POA: Diagnosis not present

## 2018-12-31 DIAGNOSIS — K5790 Diverticulosis of intestine, part unspecified, without perforation or abscess without bleeding: Secondary | ICD-10-CM | POA: Diagnosis not present

## 2018-12-31 DIAGNOSIS — F17210 Nicotine dependence, cigarettes, uncomplicated: Secondary | ICD-10-CM | POA: Diagnosis not present

## 2018-12-31 DIAGNOSIS — Z79899 Other long term (current) drug therapy: Secondary | ICD-10-CM | POA: Diagnosis not present

## 2018-12-31 DIAGNOSIS — Z6841 Body Mass Index (BMI) 40.0 and over, adult: Secondary | ICD-10-CM | POA: Insufficient documentation

## 2018-12-31 DIAGNOSIS — G629 Polyneuropathy, unspecified: Secondary | ICD-10-CM | POA: Insufficient documentation

## 2018-12-31 DIAGNOSIS — Z86010 Personal history of colonic polyps: Secondary | ICD-10-CM | POA: Diagnosis not present

## 2018-12-31 DIAGNOSIS — Z1211 Encounter for screening for malignant neoplasm of colon: Secondary | ICD-10-CM | POA: Diagnosis not present

## 2018-12-31 DIAGNOSIS — K219 Gastro-esophageal reflux disease without esophagitis: Secondary | ICD-10-CM | POA: Diagnosis not present

## 2018-12-31 DIAGNOSIS — N189 Chronic kidney disease, unspecified: Secondary | ICD-10-CM | POA: Insufficient documentation

## 2018-12-31 DIAGNOSIS — K5730 Diverticulosis of large intestine without perforation or abscess without bleeding: Secondary | ICD-10-CM | POA: Diagnosis not present

## 2018-12-31 DIAGNOSIS — Z888 Allergy status to other drugs, medicaments and biological substances status: Secondary | ICD-10-CM | POA: Insufficient documentation

## 2018-12-31 DIAGNOSIS — Z791 Long term (current) use of non-steroidal anti-inflammatories (NSAID): Secondary | ICD-10-CM | POA: Insufficient documentation

## 2018-12-31 HISTORY — PX: COLONOSCOPY WITH PROPOFOL: SHX5780

## 2018-12-31 SURGERY — COLONOSCOPY WITH PROPOFOL
Anesthesia: Monitor Anesthesia Care

## 2018-12-31 MED ORDER — PROPOFOL 10 MG/ML IV BOLUS
INTRAVENOUS | Status: AC
  Filled 2018-12-31: qty 20

## 2018-12-31 MED ORDER — LACTATED RINGERS IV SOLN
INTRAVENOUS | Status: DC
  Administered 2018-12-31: 1000 mL via INTRAVENOUS

## 2018-12-31 MED ORDER — HYDROXYZINE HCL 50 MG PO TABS
50.0000 mg | ORAL_TABLET | ORAL | Status: AC
  Administered 2018-12-31: 50 mg via ORAL
  Filled 2018-12-31: qty 1

## 2018-12-31 MED ORDER — PROPOFOL 10 MG/ML IV BOLUS
INTRAVENOUS | Status: DC | PRN
  Administered 2018-12-31: 30 mg via INTRAVENOUS
  Administered 2018-12-31: 20 mg via INTRAVENOUS

## 2018-12-31 MED ORDER — PROPOFOL 500 MG/50ML IV EMUL
INTRAVENOUS | Status: AC
  Filled 2018-12-31: qty 50

## 2018-12-31 MED ORDER — PROPOFOL 500 MG/50ML IV EMUL
INTRAVENOUS | Status: DC | PRN
  Administered 2018-12-31: 100 ug/kg/min via INTRAVENOUS

## 2018-12-31 MED ORDER — LIDOCAINE 2% (20 MG/ML) 5 ML SYRINGE
INTRAMUSCULAR | Status: DC | PRN
  Administered 2018-12-31: 100 mg via INTRAVENOUS

## 2018-12-31 MED ORDER — AMLODIPINE BESYLATE 10 MG PO TABS
10.0000 mg | ORAL_TABLET | ORAL | Status: AC
  Administered 2018-12-31: 10 mg via ORAL
  Filled 2018-12-31: qty 1

## 2018-12-31 MED ORDER — SODIUM CHLORIDE 0.9 % IV SOLN: INTRAVENOUS | Status: DC

## 2018-12-31 SURGICAL SUPPLY — 21 items

## 2018-12-31 NOTE — Transfer of Care (Signed)
Immediate Anesthesia Transfer of Care Note  Patient: Lindsay [name]  Procedure(s) Performed: COLONOSCOPY WITH PROPOFOL (N/A )  Patient Location: PACU  Anesthesia Type:MAC  Level of Consciousness: sedated  Airway & Oxygen Therapy: Patient Spontanous Breathing and Patient connected to face mask oxygen  Post-op Assessment: Report given to RN and Post -op Vital signs reviewed and stable  Post vital signs: Reviewed and stable  Last Vitals:  Vitals Value Taken Time  BP    Temp    Pulse    Resp    SpO2      Last Pain:  Vitals:   12/31/18 0637  TempSrc: Oral  PainSc: 0-No pain         Complications: No apparent anesthesia complications

## 2018-12-31 NOTE — Anesthesia Procedure Notes (Signed)
Date/Time: 12/31/2018 7:29 AM Performed by: Talbot Grumbling, CRNA Oxygen Delivery Method: Simple face mask

## 2018-12-31 NOTE — H&P (Signed)
HPI: This is a 48 yo woman  Chief complaint is history of precancerous colon polyps  ROS: complete GI ROS as described in HPI, all other review negative.  Constitutional:  No unintentional weight loss   Past Medical History:  Diagnosis Date  . Acid reflux   . Anemia    Iron Def  . Anorexia   . Chronic kidney disease    Nephrotic syndrome  . Colon polyp 2009  . Depression with anxiety   . Edema leg   . Fibromyalgia   . Hemorrhoids   . Hidradenitis suppurativa   . Hypertension   . IBS (irritable bowel syndrome)   . Low back pain   . Migraines   . Morbidly obese ([place])   . Neuromuscular disorder (HCC)    fibromyalgia  . Neuropathy   . Panic attacks   . Polyp of vocal cord or larynx   . Tonsil pain     Past Surgical History:  Procedure Laterality Date  . AXILLARY HIDRADENITIS EXCISION    . COLONOSCOPY WITH PROPOFOL N/A 05/25/2015   Procedure: COLONOSCOPY WITH PROPOFOL;  Surgeon: Milus Banister, MD;  Location: [facility] ENDOSCOPY;  Service: Endoscopy;  Laterality: N/A;  . HEMORRHOID SURGERY     with Hidradenitis surgery   . INGUINAL HIDRADENITIS EXCISION    . TONSILLECTOMY  10/18/2010   by Dr. Wilburn Cornelia  . UPPER GASTROINTESTINAL ENDOSCOPY      Current Facility-Administered Medications  Medication Dose Route Frequency Provider Last Rate Last Dose  . 0.9 %  sodium chloride infusion   Intravenous Continuous Milus Banister, MD      . lactated ringers infusion   Intravenous Continuous Milus Banister, MD 10 mL/hr at 12/31/18 0657 1,000 mL at 12/31/18 0657    Allergies as of 11/24/2018 - Review Complete 11/24/2018  Allergen Reaction Noted  . Lisinopril Cough 11/06/2015    Family History  Problem Relation Age of Onset  . Diabetes Mother   . Heart disease Mother        valve leak  . Anxiety disorder Mother   . Depression Mother   . High blood pressure Mother   . Kidney failure Mother   . Cancer Father        prostate  . Heart disease Father   . Learning  disabilities Sister   . Depression Sister   . Depression Sister   . Anxiety disorder Sister   . Colon cancer Neg Hx     Social History   Socioeconomic History  . Marital status: Single    Spouse name: Not on file  . Number of children: 1  . Years of education: 62  . Highest education level: Not on file  Occupational History  . Occupation: Disabled  Social Needs  . Financial resource strain: Not on file  . Food insecurity    Worry: Not on file    Inability: Not on file  . Transportation needs    Medical: Not on file    Non-medical: Not on file  Tobacco Use  . Smoking status: Current Some Day Smoker    Packs/day: 0.50    Years: 25.00    Pack years: 12.50    Types: Cigarettes  . Smokeless tobacco: Never Used  . Tobacco comment: 4  cigeratte a day now  Substance and Sexual Activity  . Alcohol use: Not Currently    Alcohol/week: 0.0 standard drinks    Comment: none sine 05/2018  . Drug use: Yes    Frequency:  3.0 times per week    Types: Marijuana    Comment: smoked marjuana 12/30/2018  . Sexual activity: Not Currently    Birth control/protection: Implant  Lifestyle  . Physical activity    Days per week: Not on file    Minutes per session: Not on file  . Stress: Not on file  Relationships  . Social Herbalist on phone: Not on file    Gets together: Not on file    Attends religious service: Not on file    Active member of club or organization: Not on file    Attends meetings of clubs or organizations: Not on file    Relationship status: Not on file  . Intimate partner violence    Fear of current or ex partner: Not on file    Emotionally abused: Not on file    Physically abused: Not on file    Forced sexual activity: Not on file  Other Topics Concern  . Not on file  Social History Narrative   Lives at home w/ her mother and daughter   Right-handed   Caffeine: about 3 Cokes per week     Physical Exam: BP (!) 156/89   Pulse 96   Temp 98.5 F  (36.9 C) (Oral)   Resp (!) 22   Ht 5\' 10"  (1.778 m)   Wt (!) 163.3 kg   LMP 10/31/2018 (Approximate) Comment: still been having a cycle from 10/2018  SpO2 98% Comment: wears 4L at home all the time per pt  BMI 51.65 kg/m  Constitutional: generally well-appearing, excpet for morbid obseity Psychiatric: alert and oriented x3 Abdomen: soft, nontender, nondistended, no obvious ascites, no peritoneal signs, normal bowel sounds No peripheral edema noted in lower extremities  Assessment and plan: 48 y.o. female with morbid obesity, chronic supplemental oxygen requirement, history of precancerous colon polyps  Colonoscopy today  Please see the "Patient Instructions" section for addition details about the plan.  Owens Loffler, MD [facility] 12/31/2018, 7:04 AM

## 2018-12-31 NOTE — Progress Notes (Signed)
Her post procedure BP was quite elevated. I recommended that we work to get it down, to a safer place and explained that she could have a stroke or heart attack or worse if we do not. Anesthesia advised the same. She decided to leave AMA instead.

## 2018-12-31 NOTE — Progress Notes (Addendum)
Patient had stable BP pre procedure and post procedure.  After IV was removed patient started to have elevated BP.  It was explained that for her safety it would be best if she stayed and let us give her some of her home meds to try to bring BP down.  Anesthesia MD and GI MD both spoke with patient and explained the risks for leaving.  Patient was starting to panic and just stated she needed to go. We were able to obtain patients home dose of Norvasc and hydroxyzine prior to patient departure.  Patient and daughter were instructed to have patient take her HCTZ and Valsartan on arrival at home. Patient left AMA. She did agree to be wheeled out via wheelchair.  She was taken to where her daughter was waiting and left with her.

## 2018-12-31 NOTE — Discharge Instructions (Signed)
YOU HAD AN ENDOSCOPIC PROCEDURE TODAY: Refer to the procedure report and other information in the discharge instructions given to you for any specific questions about what was found during the examination. If this information does not answer your questions, please call [place] office at [phone] to clarify.  ° °YOU SHOULD EXPECT: Some feelings of bloating in the abdomen. Passage of more gas than usual. Walking can help get rid of the air that was put into your GI tract during the procedure and reduce the bloating. If you had a lower endoscopy (such as a colonoscopy or flexible sigmoidoscopy) you may notice spotting of blood in your stool or on the toilet paper. Some abdominal soreness may be present for a day or two, also. ° °DIET: Your first meal following the procedure should be a light meal and then it is ok to progress to your normal diet. A half-sandwich or bowl of soup is an example of a good first meal. Heavy or fried foods are harder to digest and may make you feel nauseous or bloated. Drink plenty of fluids but you should avoid alcoholic beverages for 24 hours. If you had a esophageal dilation, please see attached instructions for diet.   ° °ACTIVITY: Your care partner should take you home directly after the procedure. You should plan to take it easy, moving slowly for the rest of the day. You can resume normal activity the day after the procedure however YOU SHOULD NOT DRIVE, use power tools, machinery or perform tasks that involve climbing or major physical exertion for 24 hours (because of the sedation medicines used during the test).  ° °SYMPTOMS TO REPORT IMMEDIATELY: °A gastroenterologist can be reached at any hour. Please call [phone]  for any of the following symptoms:  °Following lower endoscopy (colonoscopy, flexible sigmoidoscopy) °Excessive amounts of blood in the stool  °Significant tenderness, worsening of abdominal pains  °Swelling of the abdomen that is new, acute  °Fever of 100° or  higher  °Following upper endoscopy (EGD, EUS, ERCP, esophageal dilation) °Vomiting of blood or coffee ground material  °New, significant abdominal pain  °New, significant chest pain or pain under the shoulder blades  °Painful or persistently difficult swallowing  °New shortness of breath  °Black, tarry-looking or red, bloody stools ° °FOLLOW UP:  °If any biopsies were taken you will be contacted by phone or by letter within the next 1-3 weeks. Call [phone]  if you have not heard about the biopsies in 3 weeks.  °Please also call with any specific questions about appointments or follow up tests. ° °

## 2018-12-31 NOTE — Op Note (Signed)
[facility] Patient Name: Lindsay [name] Procedure Date: 12/31/2018 MRN: [id number] Attending MD: Milus Banister , MD Date of Birth: 1970-09-03 CSN: [id number] Age: 48 Admit Type: Outpatient Procedure:                Colonoscopy Indications:              High risk colon cancer surveillance: Personal                            history of colonic polyps; colonoscopy 2017                            (incomplete) found 3 adenomatous polyps Providers:                Milus Banister, MD, Cleda Daub, RN, Lazaro Arms, Technician Referring MD:              Medicines:                Monitored Anesthesia Care Complications:            No immediate complications. Estimated blood loss:                            None. Estimated Blood Loss:     Estimated blood loss: none. Procedure:                Pre-Anesthesia Assessment:                           - Prior to the procedure, a History and Physical                            was performed, and patient medications and                            allergies were reviewed. The patient's tolerance of                            previous anesthesia was also reviewed. The risks                            and benefits of the procedure and the sedation                            options and risks were discussed with the patient.                            All questions were answered, and informed consent                            was obtained. Prior Anticoagulants: The patient has                            taken no previous anticoagulant  or antiplatelet                            agents. ASA Grade Assessment: IV - A patient with                            severe systemic disease that is a constant threat                            to life. After reviewing the risks and benefits,                            the patient was deemed in satisfactory condition to                            undergo the procedure.                      After obtaining informed consent, the colonoscope                            was passed under direct vision. Throughout the                            procedure, the patient's blood pressure, pulse, and                            oxygen saturations were monitored continuously. The                            [id number] Olympus colonoscope was                            introduced through the anus and advanced to the the                            cecum, identified by appendiceal orifice and                            ileocecal valve. The colonoscopy was performed                            without difficulty. The patient tolerated the                            procedure well. The quality of the bowel                            preparation was good. The ileocecal valve,                            appendiceal orifice, and rectum were photographed. Scope In: 7:35:35 AM Scope Out: 7:52:37 AM Scope Withdrawal Time: 0 hours 11 minutes 20 seconds  Total Procedure Duration: 0 hours 17 minutes 2 seconds  Findings:  Multiple small-mouthed diverticula were found in the left colon.      The exam was otherwise without abnormality on direct and retroflexion       views. Impression:               - Diverticulosis in the left colon.                           - The examination was otherwise normal on direct                            and retroflexion views.                           - No polyps or cancers. Moderate Sedation:      Not Applicable - Patient had care per Anesthesia. Recommendation:           - Patient has a contact number available for                            emergencies. The signs and symptoms of potential                            delayed complications were discussed with the                            patient. Return to normal activities tomorrow.                            Written discharge instructions were provided to the                             patient.                           - Resume previous diet.                           - Continue present medications.                           - Repeat colonoscopy in 7 years for surveillance. Procedure Code(s):        --- Professional ---                           [phone], Colonoscopy, flexible; diagnostic, including                            collection of specimen(s) by brushing or washing,                            when performed (separate procedure) Diagnosis Code(s):        --- Professional ---                           Z86.010, Personal history of colonic polyps  K57.30, Diverticulosis of large intestine without                            perforation or abscess without bleeding CPT copyright 2019 [employer]. All rights reserved. The codes documented in this report are preliminary and upon coder review may  be revised to meet current compliance requirements. Milus Banister, MD 12/31/2018 7:58:01 AM This report has been signed electronically. Number of Addenda: 0

## 2018-12-31 NOTE — Anesthesia Postprocedure Evaluation (Signed)
Anesthesia Post Note  Patient: Lindsay [name]  Procedure(s) Performed: COLONOSCOPY WITH PROPOFOL (N/A )     Patient location during evaluation: PACU Anesthesia Type: MAC Level of consciousness: awake and alert Pain management: pain level controlled Vital Signs Assessment: post-procedure vital signs reviewed and stable Respiratory status: spontaneous breathing Cardiovascular status: stable Anesthetic complications: no Comments: Pt hypertensive in recovery. Called by RN for orders. Requested IV hydralazine, but they had already pulled her IV. Patient claims PAT told her not to take her BP meds. I requested that we give her PO meds. Pt states she wants to leave AMA. Dr. Ardis Hughs and I both spoke with her emphasizing the desire to get her BP under control before D/C. She adamantly refused and said she wants to leave AMA. She agreed to take PO meds prior to D/C and were able to get some of her BP meds in before she left.    Last Vitals:  Vitals:   12/31/18 0830 12/31/18 0845  BP: (!) 170/127 (!) 205/124  Pulse: 84   Resp: (!) 21   Temp:    SpO2: 97%     Last Pain:  Vitals:   12/31/18 0800  TempSrc: Oral  PainSc: 0-No pain                 Nolon Nations

## 2019-01-01 ENCOUNTER — Encounter: Payer: Self-pay | Admitting: Internal Medicine

## 2019-01-01 ENCOUNTER — Ambulatory Visit (INDEPENDENT_AMBULATORY_CARE_PROVIDER_SITE_OTHER): Payer: Medicare Other | Admitting: Internal Medicine

## 2019-01-01 ENCOUNTER — Telehealth: Payer: Self-pay | Admitting: Gastroenterology

## 2019-01-01 VITALS — BP 149/97 | HR 99 | Temp 98.8°F | Ht 70.0 in | Wt 351.0 lb

## 2019-01-01 DIAGNOSIS — Z79891 Long term (current) use of opiate analgesic: Secondary | ICD-10-CM

## 2019-01-01 DIAGNOSIS — D539 Nutritional anemia, unspecified: Secondary | ICD-10-CM | POA: Diagnosis not present

## 2019-01-01 DIAGNOSIS — G8929 Other chronic pain: Secondary | ICD-10-CM | POA: Diagnosis not present

## 2019-01-01 DIAGNOSIS — M25562 Pain in left knee: Secondary | ICD-10-CM | POA: Diagnosis not present

## 2019-01-01 DIAGNOSIS — E538 Deficiency of other specified B group vitamins: Secondary | ICD-10-CM

## 2019-01-01 DIAGNOSIS — Z79899 Other long term (current) drug therapy: Secondary | ICD-10-CM

## 2019-01-01 DIAGNOSIS — M79605 Pain in left leg: Secondary | ICD-10-CM

## 2019-01-01 DIAGNOSIS — M25561 Pain in right knee: Secondary | ICD-10-CM

## 2019-01-01 DIAGNOSIS — Z6841 Body Mass Index (BMI) 40.0 and over, adult: Secondary | ICD-10-CM

## 2019-01-01 DIAGNOSIS — E559 Vitamin D deficiency, unspecified: Secondary | ICD-10-CM

## 2019-01-01 MED ORDER — HYDROCODONE-ACETAMINOPHEN 5-325 MG PO TABS: 1.0000 | ORAL_TABLET | Freq: Four times a day (QID) | ORAL | 0 refills | Status: DC | PRN

## 2019-01-01 NOTE — Assessment & Plan Note (Signed)
Discussed importance of weight loss, it is imperative that she attempt to begin this process.  Advised her on avoiding prepared foods and or packaged foods instead substituting these with fresh food.  See chronic pain A/P for additional details from the same day

## 2019-01-01 NOTE — Telephone Encounter (Signed)
What medication was suppose to be sent in for this patient? Please advise.

## 2019-01-01 NOTE — Assessment & Plan Note (Signed)
Vitamin D serum lab obtained today

## 2019-01-01 NOTE — Patient Instructions (Signed)
FOLLOW-UP INSTRUCTIONS When: With Dr. Philipp Ovens in January For: Routine visit and pain follow-up What to bring: All of your medications  Today we discussed your chronic pain in your legs and neck. I agree with Dr. Philipp Ovens that non-weight bearing exercises such as water aerobics and an exercise bike are ideal. You will need to continue to work on weight loss as decreasing the strain on your joints is the only way to decrease the progression of your underlying illness. Please make certain you follow-up I will notify you of the results of any labs from today's evaluation when available to me.    Thank you for your visit to the [facility] today. If you have any questions or concerns please call us at [phone].

## 2019-01-01 NOTE — Progress Notes (Signed)
   CC: Acutely worsening of her chronic bilateral knee pain  HPI:[name] is a 48 y.o. female who presents for evaluation of knee pain. Please see individual problem based A/P for details.  Past Medical History:  Diagnosis Date  . Acid reflux   . Anemia    Iron Def  . Anorexia   . Chronic kidney disease    Nephrotic syndrome  . Colon polyp 2009  . Depression with anxiety   . Edema leg   . Fibromyalgia   . Hemorrhoids   . Hidradenitis suppurativa   . Hypertension   . IBS (irritable bowel syndrome)   . Low back pain   . Migraines   . Morbidly obese ([place])   . Neuromuscular disorder ([facility])    fibromyalgia  . Neuropathy   . Panic attacks   . Polyp of vocal cord or larynx   . Tonsil pain    Review of Systems:  ROS negative except as per HPI.  Physical Exam: Vitals:   01/01/19 0932  BP: (!) 149/97  Pulse: 99  Temp: 98.8 F (37.1 C)  TempSrc: Oral  SpO2: 99%  Weight: (!) 351 lb (159.2 kg)  Height: 5\' 10"  (1.778 m)   General: O x4, in no acute distress, afebrile, nondiaphoretic HEENT: PEERL, EMO intact MSK: BLE nontender, nonedematous Neuro: Alert,  conversational, strength 5/5 in the upper and lower extremities bilaterally Psych: Appropriate affect, not depressed in appearance, engages well  Assessment & Plan:   See Encounters Tab for problem based charting.  Patient discussed with Dr. Angelia Mould

## 2019-01-01 NOTE — Assessment & Plan Note (Signed)
Chronically low.  Will obtain serum B12 today

## 2019-01-01 NOTE — Assessment & Plan Note (Signed)
We will obtain a ferritin today for evaluation and follow-up

## 2019-01-01 NOTE — Assessment & Plan Note (Signed)
Acutely worsening of her chronic knee pain: Left leg pain from the knee to the foot, right knee 2-3 years now. The pain progressed from there to the extent that it is severe now. After her bought of pneumonia in the hospital in late 2019 her pain began to grow rapidly worse with the resulting immobilization. She subsequently developed respiratory failure due to the pulmonary illness and underlying process. This has led to decreased mobilization which in turn progressively worsens her pain. This past memorial day she stated that while exiting a storage unit she slipped on the wet surface and fell. She attempted to prevent her head from striking the ground but did hit her back hard causing her neck pain to worsen. Since that time she has not been to her baseline and feels that in the past two weeks she feels as if she has been working out in [employer]. Although she is not able to do this in reality. She continues to complete her physical therapy sessions which she finds to be very difficult given the stiffness and pain. She understands that she will need to lose weight but she has yet to make a concerted effort to do so.  We discussed the risks and benefits of not losing weight and how that if she is to continues on the only real benefit to prevent progression will be weight loss and exercise. She will need to make a more concerted effort to lose weight. She understands that she is scheduled for the MRI later this month with her orthopedic surgeon follow-up. We have discussed the benefits and risks of a GLP-1 agonist with weight loss.  However, it appears her insurance will not approve this and as such she is not able/willing to pay the co-pay and her out-of-pocket price. We discussed the risk versus benefits of opioid medication therapy including the risk of respiratory suppression.  She desires to continue with therapy.  Plan: Obtained labs for Vitamin D, vitamin B12 and ferritin today Continue physical  therapy Norco 60 tablets at a dose of 5/325 until her return visit in January 13 with Dr. Philipp Ovens, she understands this is the last until that appointment Guidance given weight loss exercise Patient to follow-up with orthopedic surgeon after MRI of the lumbar spine

## 2019-01-02 LAB — VITAMIN B12: Vitamin B-12: 1434 pg/mL — ABNORMAL HIGH (ref 232–1245)

## 2019-01-02 LAB — VITAMIN D 25 HYDROXY (VIT D DEFICIENCY, FRACTURES): Vit D, 25-Hydroxy: 17.3 ng/mL — ABNORMAL LOW (ref 30.0–100.0)

## 2019-01-02 LAB — FERRITIN: Ferritin: 32 ng/mL (ref 15–150)

## 2019-01-04 MED ORDER — PROMETHAZINE HCL 12.5 MG PO TABS: 12.5000 mg | ORAL_TABLET | Freq: Four times a day (QID) | ORAL | 1 refills | Status: DC | PRN

## 2019-01-04 NOTE — Telephone Encounter (Signed)
I apologize but I don't recall this and I don't see any notes in her colonoscopy report about it.  Can you ask her what this is about?

## 2019-01-04 NOTE — Progress Notes (Signed)
Internal Medicine Clinic Attending  Case discussed with Dr. Harbrecht at the time of the visit.  We reviewed the resident's history and exam and pertinent patient test results.  I agree with the assessment, diagnosis, and plan of care documented in the resident's note.   

## 2019-01-04 NOTE — Telephone Encounter (Signed)
Patient states she needs a refill on Promethazine. Script sent to pharmacy.

## 2019-01-07 ENCOUNTER — Telehealth: Payer: Self-pay | Admitting: Internal Medicine

## 2019-01-07 DIAGNOSIS — M79605 Pain in left leg: Secondary | ICD-10-CM

## 2019-01-07 DIAGNOSIS — G8929 Other chronic pain: Secondary | ICD-10-CM

## 2019-01-07 NOTE — Telephone Encounter (Signed)
Thank you Dr. Berline Lopes. If you could send in another prescription for the remaining tablets that would be great. If there are any more issues, let me know and I will call her pharmacy and insurance. Thank you!

## 2019-01-07 NOTE — Telephone Encounter (Signed)
Called patient to discuss why her prescription was only filled for #30 tablets. She stated that she is in notable pain still today. I called and spoke with the pharmacist at [facility] who states that the only manner in which he believes this could have occurred as of insurance refused to fill the prescription for 60 and opted instead to fill for 30 tablets an acute 7-day supply.  I am unsure why they were authorized to do this or how this is permitted but will attempt to rectify the situation.  I advised the patient to only take a maximum of 2 tablets daily as we had originally discussed as opposed to the manner in which the prescription was written by the pharmacist.  I will forward this to her PCP as well. I informed her that her Vit D was low at 17 and I advised her to increase this to 2000U daily.

## 2019-01-11 ENCOUNTER — Ambulatory Visit (HOSPITAL_COMMUNITY): Admission: RE | Admit: 2019-01-11 | Payer: Medicare Other | Source: Ambulatory Visit

## 2019-01-11 ENCOUNTER — Other Ambulatory Visit: Payer: Self-pay | Admitting: Pulmonary Disease

## 2019-01-11 ENCOUNTER — Ambulatory Visit (INDEPENDENT_AMBULATORY_CARE_PROVIDER_SITE_OTHER): Payer: Medicare Other | Admitting: Licensed Clinical Social Worker

## 2019-01-11 DIAGNOSIS — F411 Generalized anxiety disorder: Secondary | ICD-10-CM | POA: Diagnosis not present

## 2019-01-11 DIAGNOSIS — F331 Major depressive disorder, recurrent, moderate: Secondary | ICD-10-CM | POA: Diagnosis not present

## 2019-01-11 MED ORDER — HYDROCODONE-ACETAMINOPHEN 5-325 MG PO TABS: 1.0000 | ORAL_TABLET | Freq: Two times a day (BID) | ORAL | 0 refills | Status: DC | PRN

## 2019-01-11 NOTE — Addendum Note (Signed)
Addended by: Nicola Girt on: 01/11/2019 07:58 AM   Modules accepted: Orders
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
## 2019-01-12 ENCOUNTER — Other Ambulatory Visit (HOSPITAL_COMMUNITY): Payer: Self-pay | Admitting: Psychiatry

## 2019-01-12 ENCOUNTER — Ambulatory Visit: Payer: Medicare Other | Admitting: Family Medicine

## 2019-01-12 DIAGNOSIS — F331 Major depressive disorder, recurrent, moderate: Secondary | ICD-10-CM

## 2019-01-12 DIAGNOSIS — F411 Generalized anxiety disorder: Secondary | ICD-10-CM

## 2019-01-13 ENCOUNTER — Other Ambulatory Visit: Payer: Self-pay | Admitting: Internal Medicine

## 2019-01-13 DIAGNOSIS — M79605 Pain in left leg: Secondary | ICD-10-CM

## 2019-01-13 DIAGNOSIS — M79604 Pain in right leg: Secondary | ICD-10-CM

## 2019-01-13 DIAGNOSIS — M797 Fibromyalgia: Secondary | ICD-10-CM

## 2019-01-14 NOTE — Telephone Encounter (Addendum)
Received refill request for patient's lyrica. Per database, she has not filled this medication since June 30th of this year (six months ago) for a 1 month supply. Called patient with my concerns has she has called multiple times recently for uncontrolled pain requesting "stronger pain meds". Patient reports she did not realize she was running low on lyrica until recently, and has been only taking her tablet's once a day instead of TID to make them last. She tried to call her pharmacy for a refill but tells me that her refills had expired. This further validates my concern for non compliance. I informed patient it is very important that she stays on top of her lyrica and cymbalta prescriptions given her poorly controlled fibromyalgia. Moving forward, I will not provide additional opioid prescriptions unless she is able to reliably take these medications. She expressed understanding. Sent 6 months supply of lyrica to her pharmacy. She has a follow up appointment with me in 1 month.

## 2019-01-17 ENCOUNTER — Other Ambulatory Visit: Payer: Self-pay | Admitting: Internal Medicine

## 2019-01-17 DIAGNOSIS — M79604 Pain in right leg: Secondary | ICD-10-CM

## 2019-01-17 DIAGNOSIS — M79605 Pain in left leg: Secondary | ICD-10-CM

## 2019-01-20 ENCOUNTER — Ambulatory Visit
Admission: RE | Admit: 2019-01-20 | Discharge: 2019-01-20 | Disposition: A | Discharge status: Home / Self Care | Payer: Medicare Other | Source: Ambulatory Visit | Attending: Physical Medicine and Rehabilitation | Admitting: Physical Medicine and Rehabilitation

## 2019-01-20 DIAGNOSIS — M545 Low back pain: Secondary | ICD-10-CM | POA: Diagnosis not present

## 2019-01-20 DIAGNOSIS — M5136 Other intervertebral disc degeneration, lumbar region: Secondary | ICD-10-CM

## 2019-01-20 IMAGING — MR MR LUMBAR SPINE W/O CM
4 of 5 series · 27 of 48 positions shown · non-contrast
Comparison: Lumbar radiographs dated [DATE]

CLINICAL DATA: Low back pain and bilateral buttock pain since a
fall 1 year ago.

EXAM:
MRI LUMBAR SPINE WITHOUT CONTRAST
TECHNIQUE: Multiplanar, multisequence MR imaging of the lumbar spine was
performed. No intravenous contrast was administered.

[Series 3: T2 · sagittal · 4.0mm · 0.55mm/px · 5 of 14 slices shown (1 of 2)]
[im 1/14]
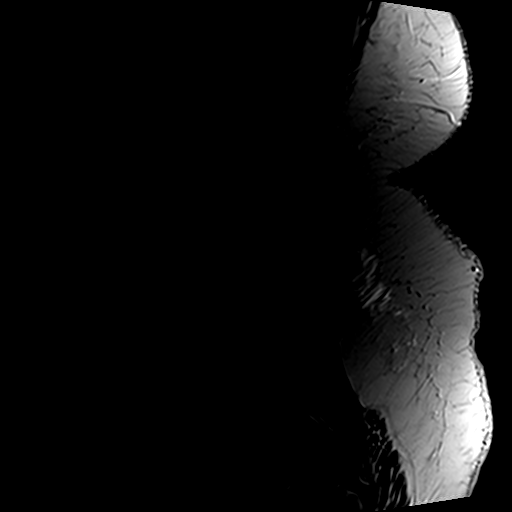
[im 4/14]
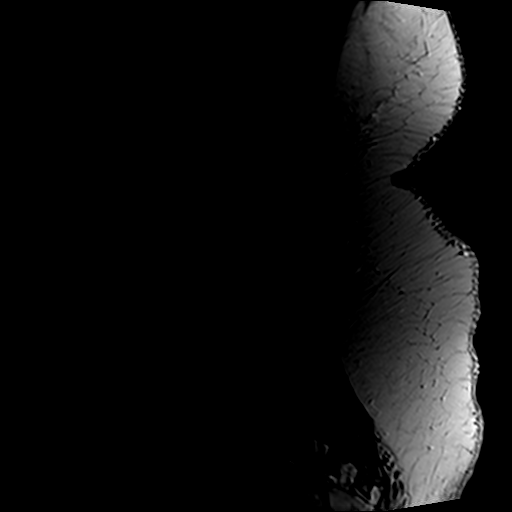
[im 7/14]
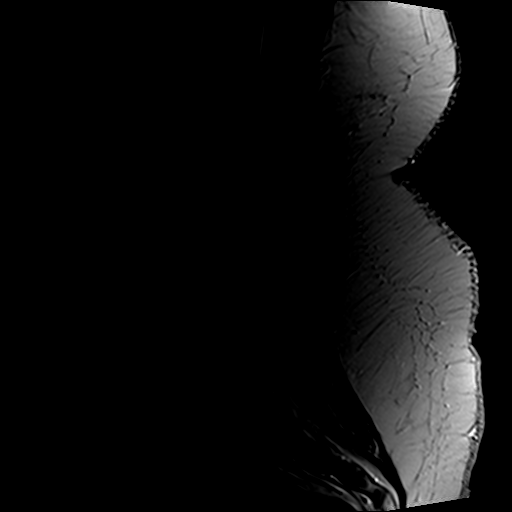
[im 10/14]
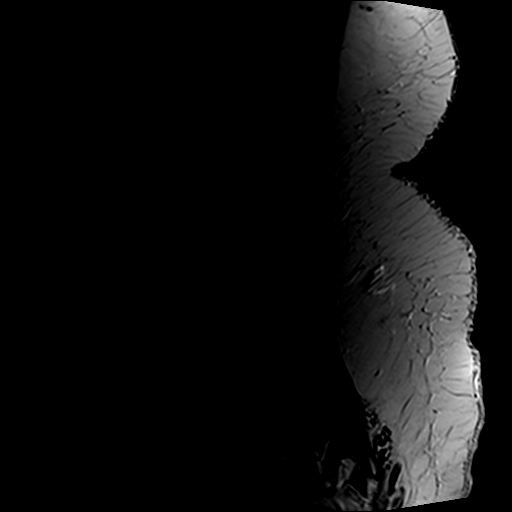
[im 14/14]
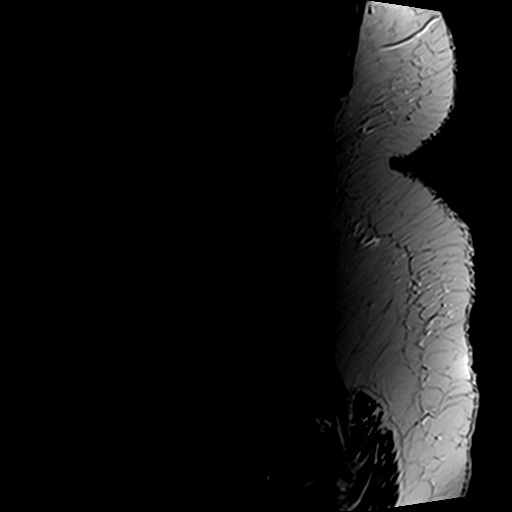

[Series 4: T1 · sagittal · 4.0mm · 0.55mm/px · 5 of 14 slices shown (1 of 2)]
[im 1/14]
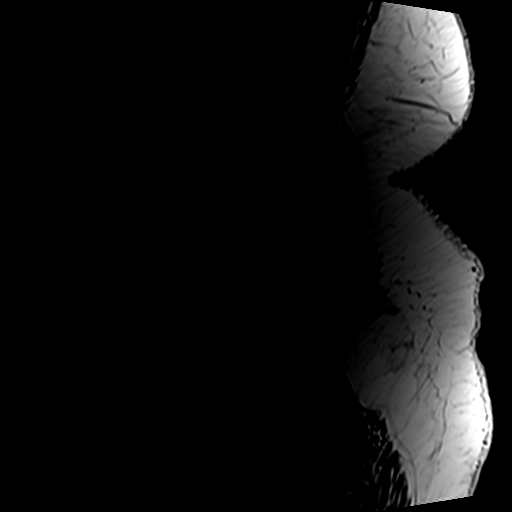
[im 4/14]
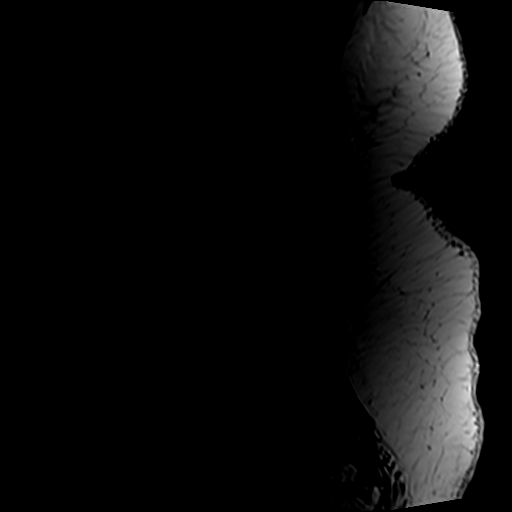
[im 7/14]
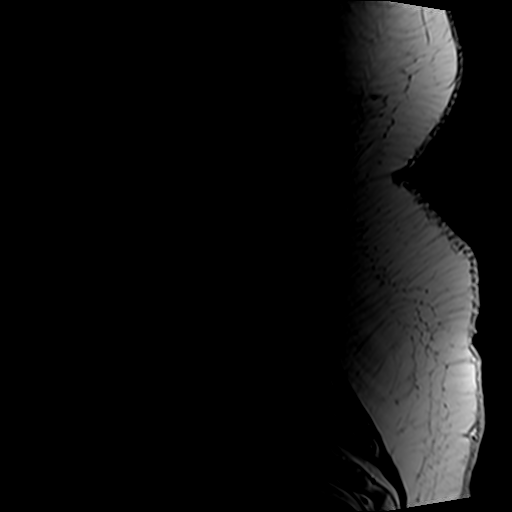
[im 10/14]
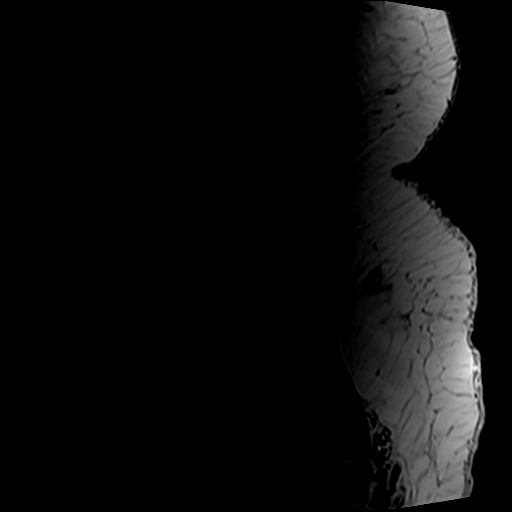
[im 14/14]
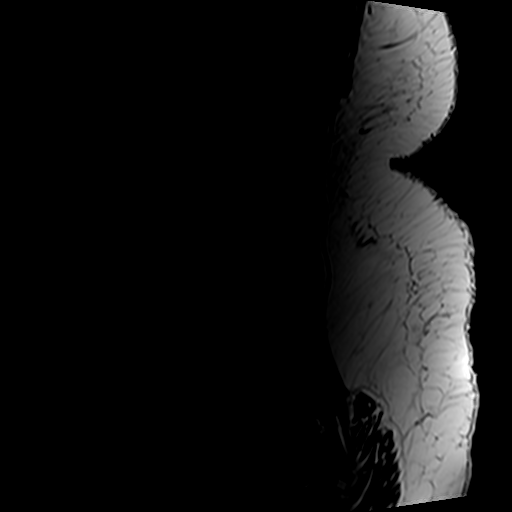

[Series 6: T2 · axial · 4.0mm · 0.70mm/px · z∈[-93,+114]mm · 10 of 39 slices shown (2 of 2)]
[im 3/39]
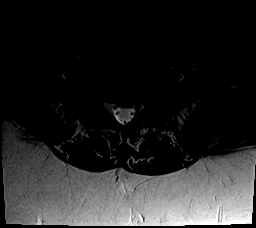
[im 6/39]
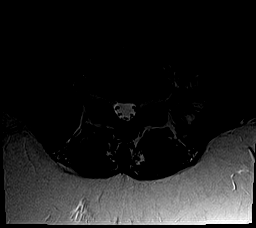
[im 8/39]
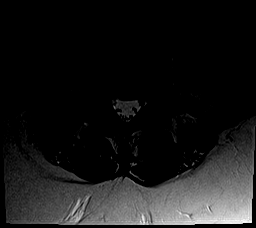
[im 13/39]
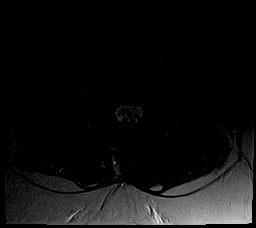
[im 18/39]
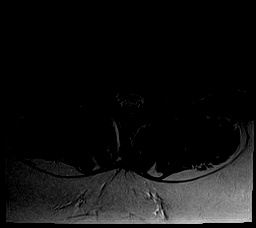
[im 21/39]
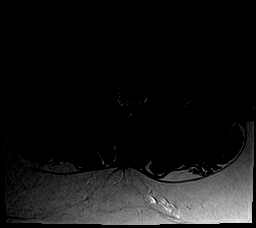
[im 23/39]
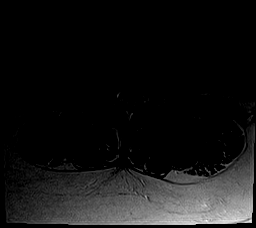
[im 28/39]
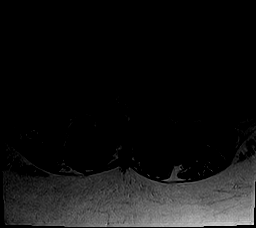
[im 33/39]
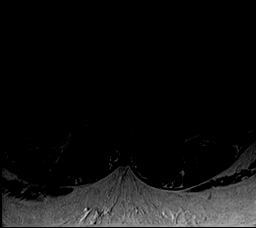
[im 39/39]
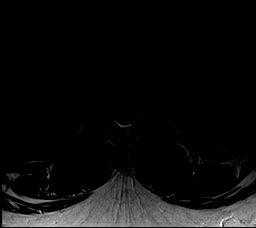

[Series 7: T1 · axial · 4.0mm · 0.35mm/px · z∈[-93,+84]mm · 7 of 39 slices shown (2 of 2)]
[im 3/39]
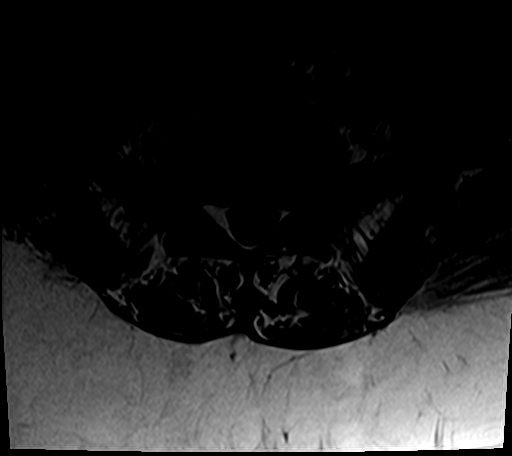
[im 6/39]
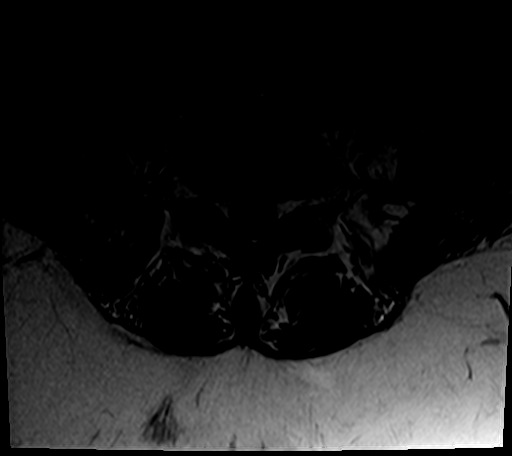
[im 8/39]
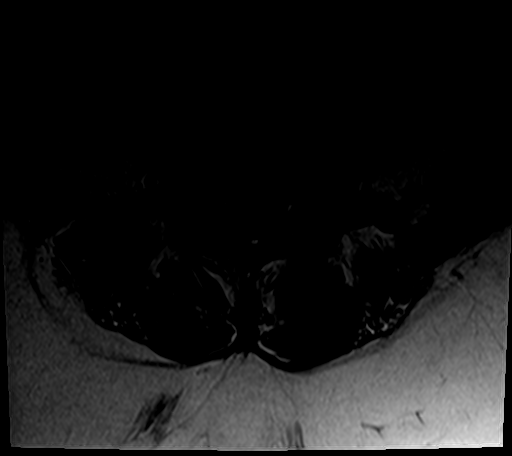
[im 13/39]
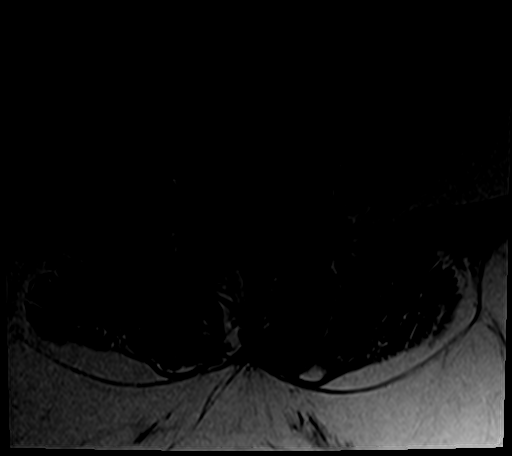
[im 18/39]
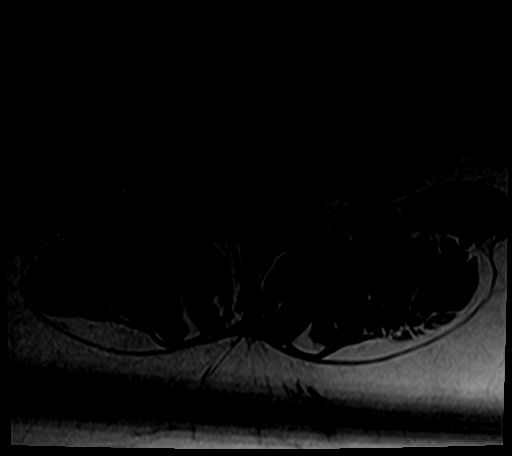
[im 21/39]
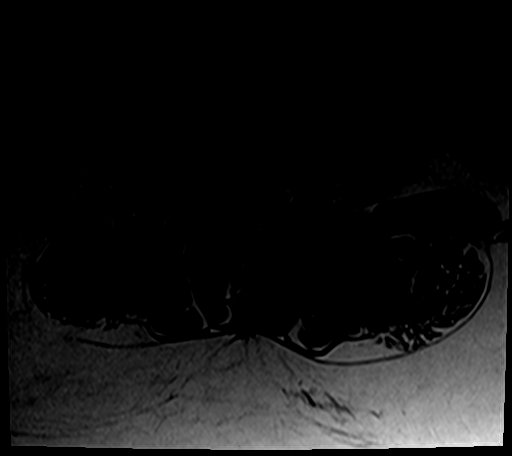
[im 33/39]
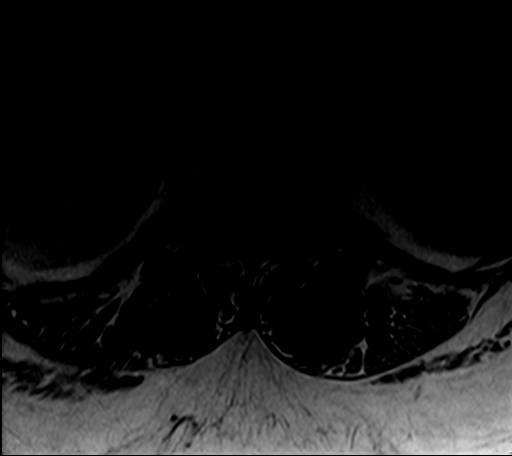

[27 of 48 positions shown; findings below may reference images not displayed]

FINDINGS: Segmentation:  Standard.

Alignment:  Normal.

Vertebrae:  No fracture, evidence of discitis, or bone lesion.

Conus medullaris and cauda equina: Conus extends to the L2 level.
Conus and cauda equina appear normal.

Paraspinal and other soft tissues: Negative.

Disc levels:

T12-L1 and L1-2: Normal.

L2-3 through L4-5: Minimal disc desiccation. No disc bulging or
protrusion. No spinal or foraminal stenosis. No facet arthritis.
Tiny Schmorl's node in the inferior endplate of L4.

L5-S1: Normal.
IMPRESSION: No significant abnormality of the lumbar spine.

## 2019-01-23 ENCOUNTER — Other Ambulatory Visit: Payer: Self-pay | Admitting: Pulmonary Disease

## 2019-01-25 ENCOUNTER — Other Ambulatory Visit: Payer: Self-pay

## 2019-01-25 ENCOUNTER — Ambulatory Visit
Admission: RE | Admit: 2019-01-25 | Discharge: 2019-01-25 | Disposition: A | Discharge status: Home / Self Care | Payer: Medicare Other | Source: Ambulatory Visit | Attending: Family Medicine | Admitting: Family Medicine

## 2019-01-25 DIAGNOSIS — Z01419 Encounter for gynecological examination (general) (routine) without abnormal findings: Secondary | ICD-10-CM

## 2019-01-25 DIAGNOSIS — J9620 Acute and chronic respiratory failure, unspecified whether with hypoxia or hypercapnia: Secondary | ICD-10-CM | POA: Diagnosis not present

## 2019-01-25 DIAGNOSIS — Z1231 Encounter for screening mammogram for malignant neoplasm of breast: Secondary | ICD-10-CM | POA: Diagnosis not present

## 2019-01-25 IMAGING — MG DIGITAL SCREENING BILAT W/ CAD
4 series · 4 of 4 positions shown · non-contrast
Comparison: Previous exam(s).

ACR Breast Density Category a: The breast tissue is almost entirely
fatty.

CLINICAL DATA: Screening.

EXAM:
DIGITAL SCREENING BILATERAL MAMMOGRAM WITH CAD

[R CC]
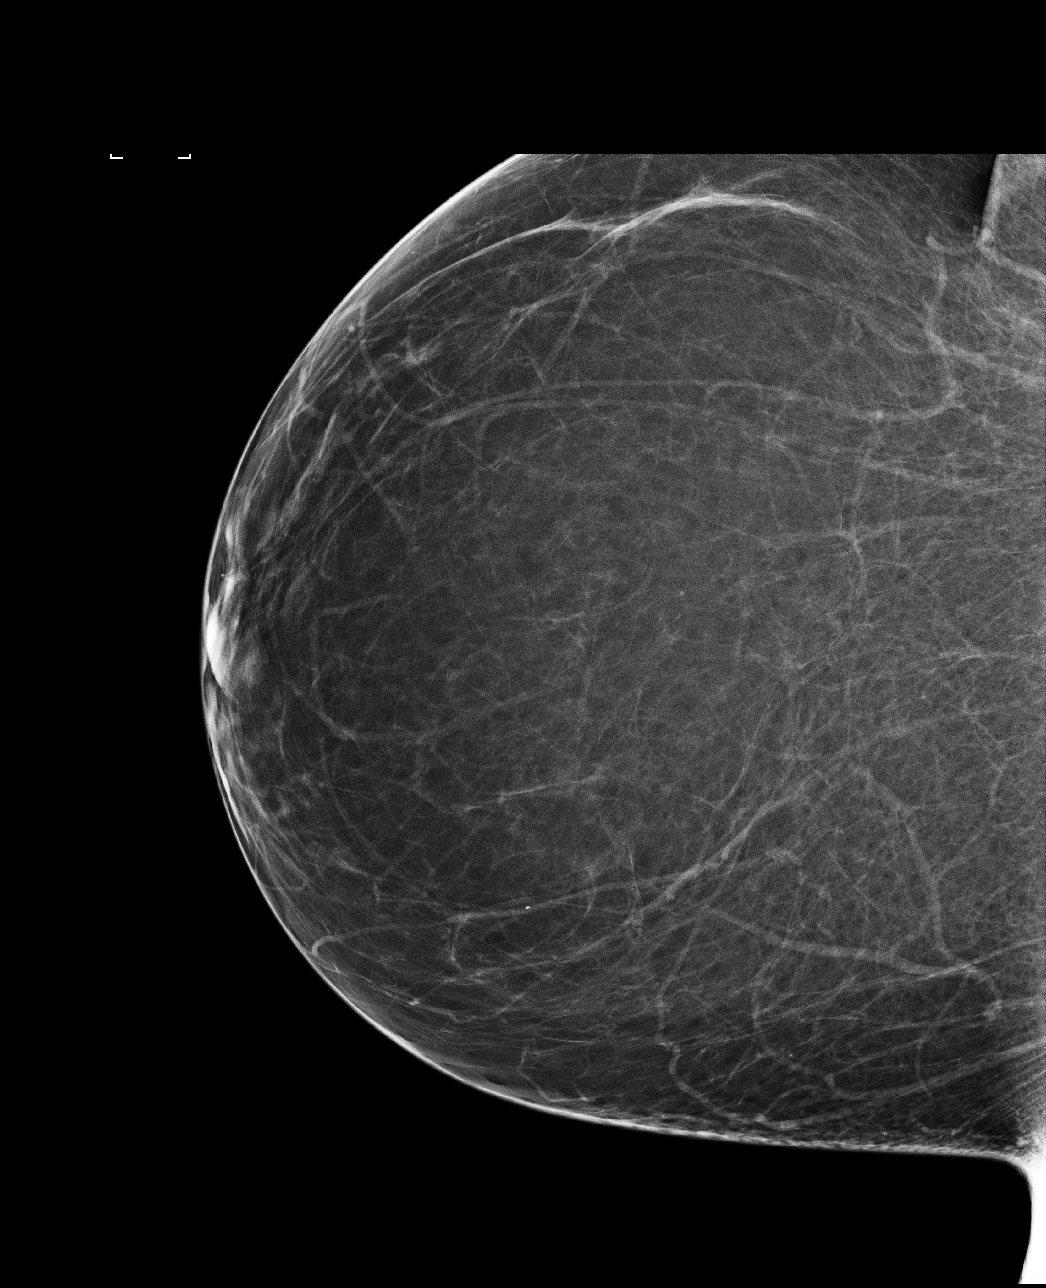

[R MLO]
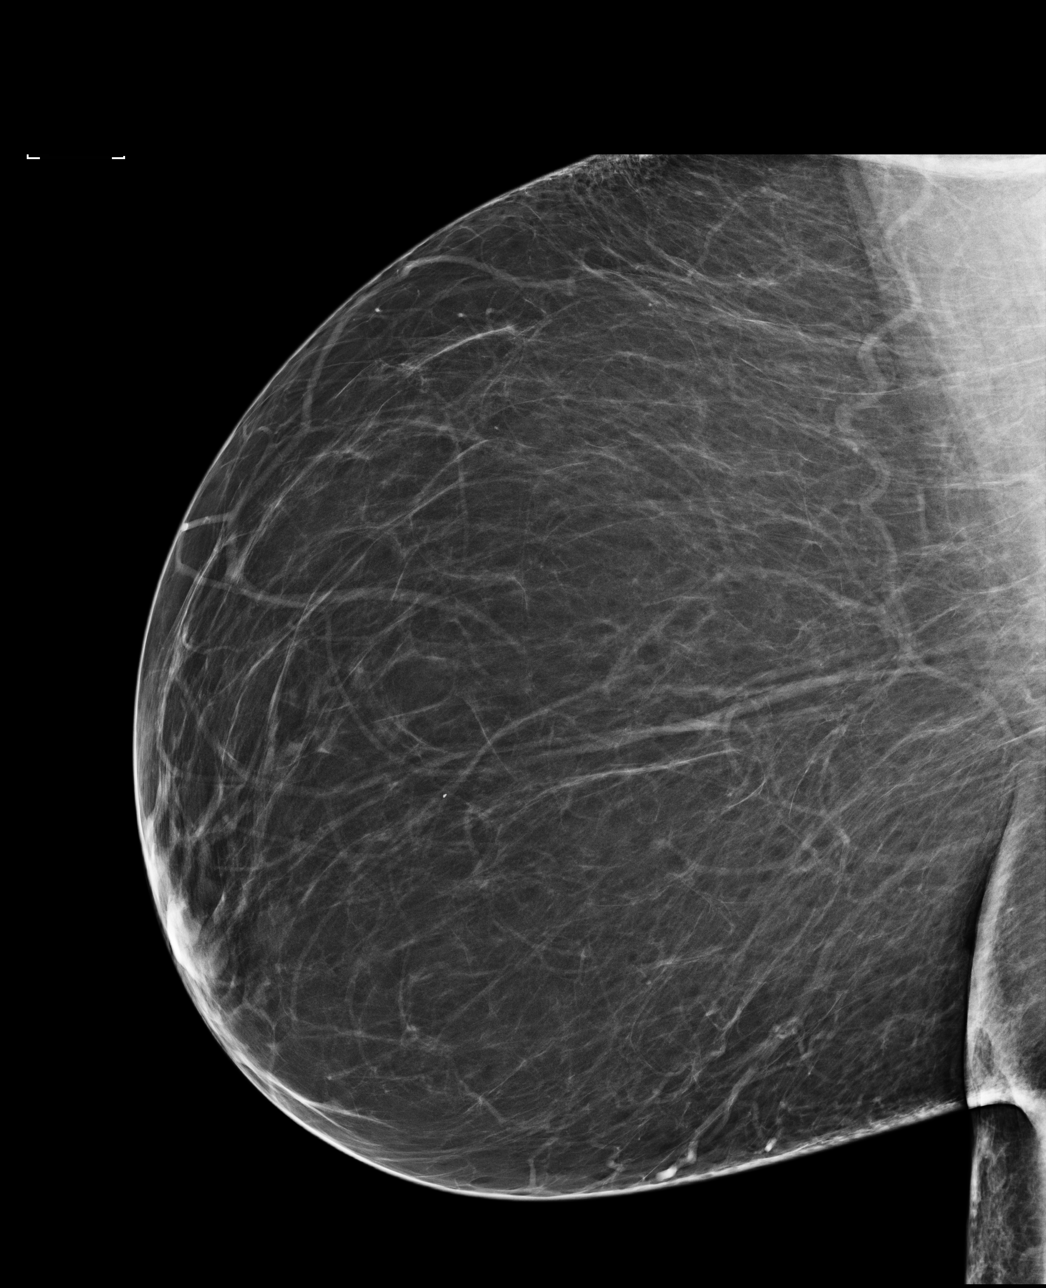

[L MLO]
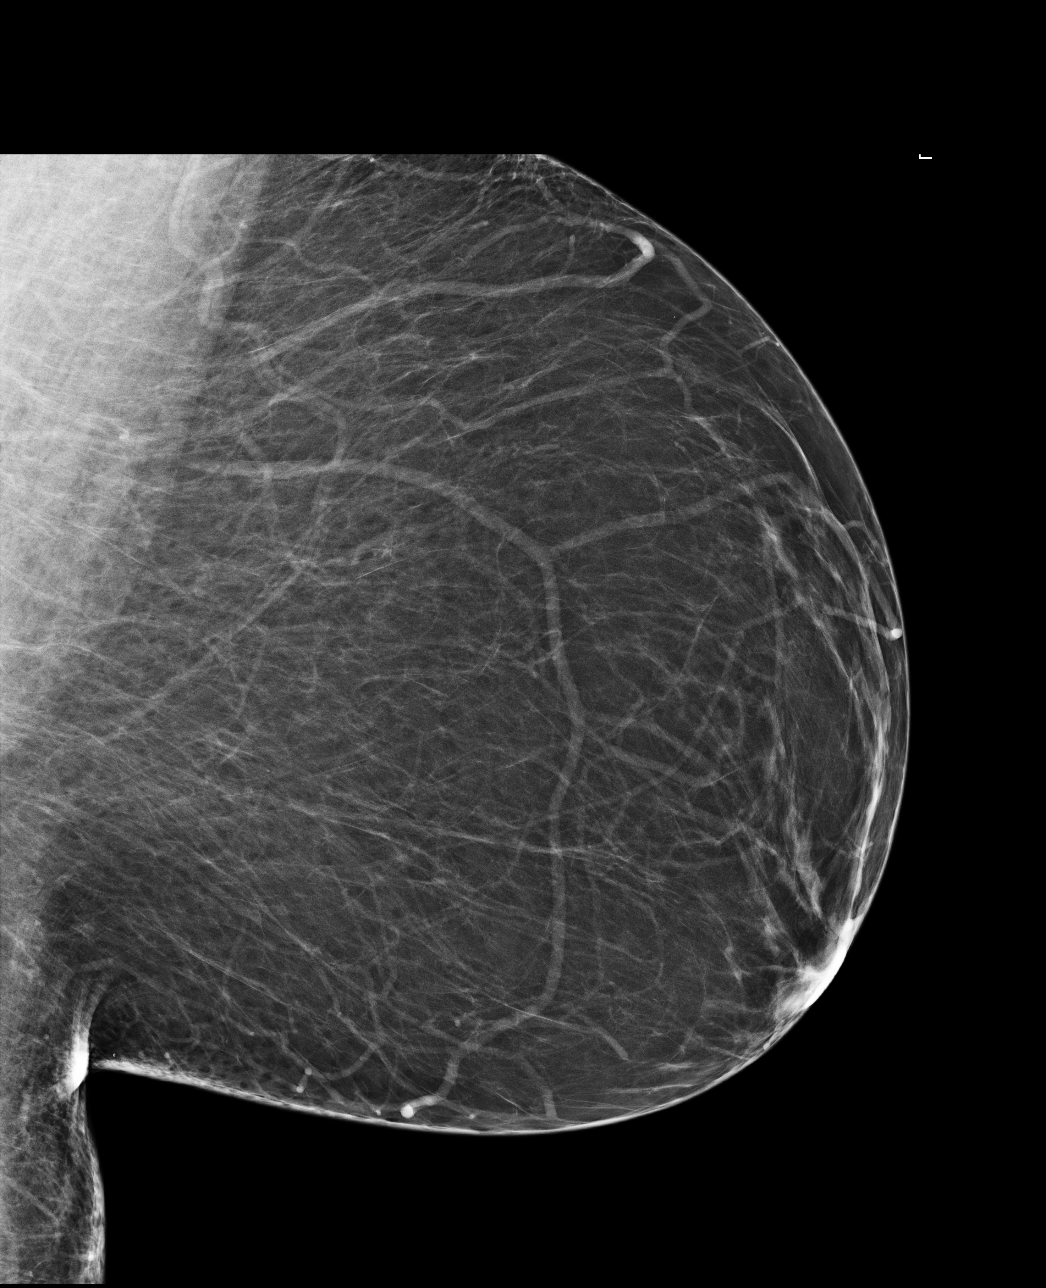

[L CC]
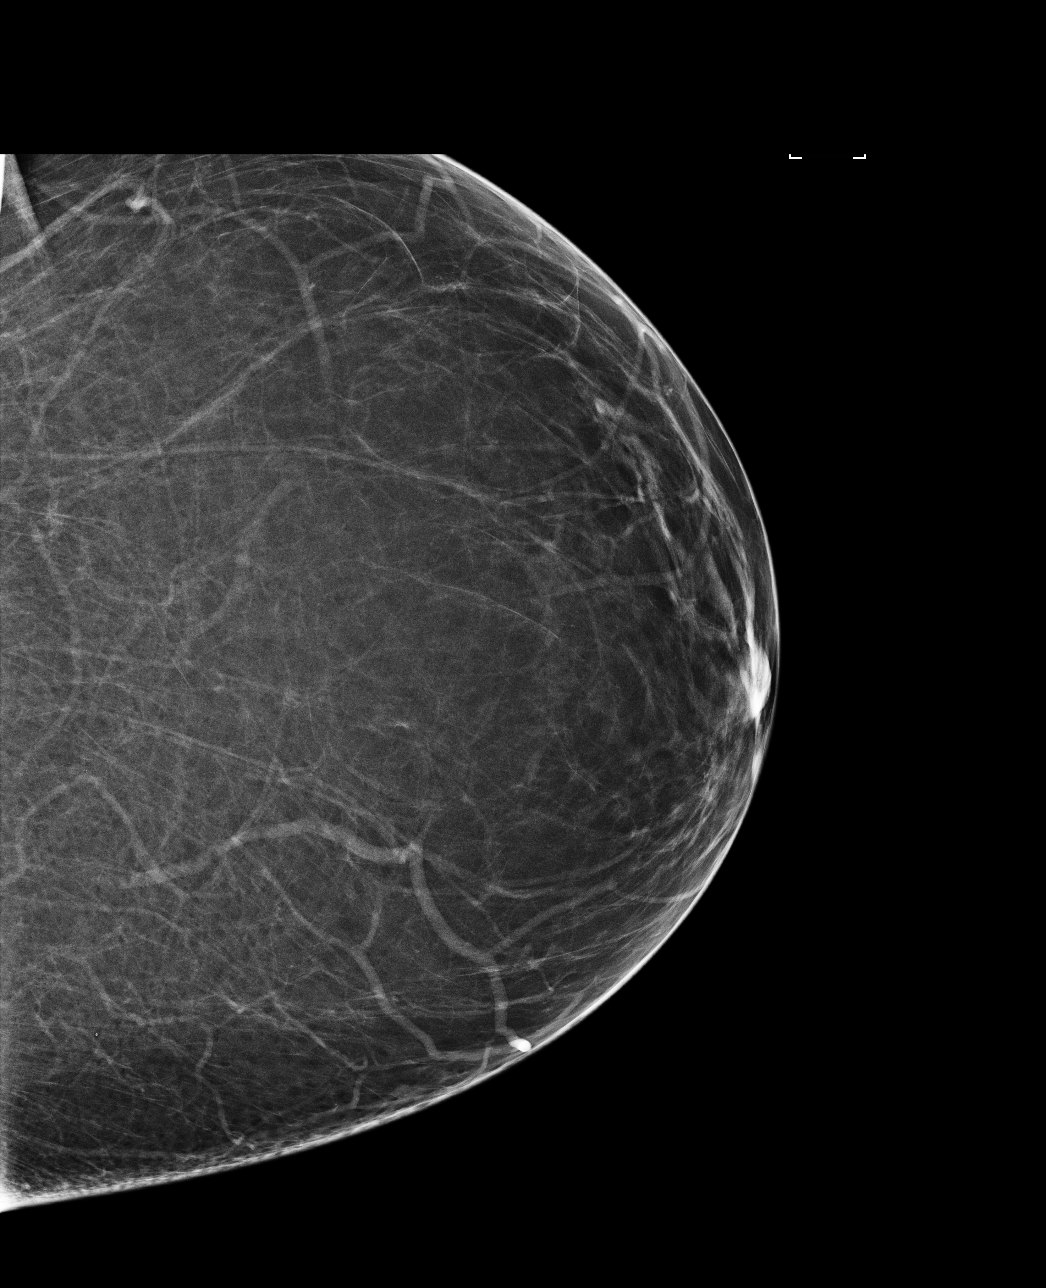

[4 of 4 positions shown; findings below may reference images not displayed]

FINDINGS: There are no findings suspicious for malignancy. Images were
processed with CAD.
IMPRESSION: No mammographic evidence of malignancy. A result letter of this
screening mammogram will be mailed directly to the patient.

RECOMMENDATION:
Screening mammogram in one year. (Code:[0V])

BI-RADS CATEGORY  1: Negative.

## 2019-02-01 ENCOUNTER — Ambulatory Visit: Payer: Medicare Other | Attending: Internal Medicine

## 2019-02-03 ENCOUNTER — Other Ambulatory Visit: Payer: Self-pay | Admitting: Pulmonary Disease

## 2019-02-04 ENCOUNTER — Other Ambulatory Visit: Payer: Self-pay

## 2019-02-04 ENCOUNTER — Ambulatory Visit (INDEPENDENT_AMBULATORY_CARE_PROVIDER_SITE_OTHER): Payer: Medicare Other | Admitting: Psychiatry

## 2019-02-04 ENCOUNTER — Encounter (HOSPITAL_COMMUNITY): Payer: Self-pay | Admitting: Psychiatry

## 2019-02-04 DIAGNOSIS — F411 Generalized anxiety disorder: Secondary | ICD-10-CM | POA: Diagnosis not present

## 2019-02-04 DIAGNOSIS — F331 Major depressive disorder, recurrent, moderate: Secondary | ICD-10-CM

## 2019-02-04 MED ORDER — REXULTI 3 MG PO TABS: 3.0000 mg | ORAL_TABLET | Freq: Every day | ORAL | 0 refills | Status: DC

## 2019-02-04 MED ORDER — CLORAZEPATE DIPOTASSIUM 7.5 MG PO TABS: 7.5000 mg | ORAL_TABLET | Freq: Every day | ORAL | 1 refills | Status: DC

## 2019-02-04 MED ORDER — HYDROXYZINE PAMOATE 50 MG PO CAPS: 50.0000 mg | ORAL_CAPSULE | Freq: Every evening | ORAL | 1 refills | Status: DC | PRN

## 2019-02-04 MED ORDER — LAMOTRIGINE 25 MG PO TABS: ORAL_TABLET | ORAL | 1 refills | Status: DC
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
## 2019-02-05 ENCOUNTER — Other Ambulatory Visit: Payer: Self-pay | Admitting: Gastroenterology

## 2019-02-08 ENCOUNTER — Other Ambulatory Visit: Payer: Self-pay

## 2019-02-08 ENCOUNTER — Ambulatory Visit (INDEPENDENT_AMBULATORY_CARE_PROVIDER_SITE_OTHER): Payer: Medicare Other | Admitting: Licensed Clinical Social Worker

## 2019-02-08 DIAGNOSIS — F411 Generalized anxiety disorder: Secondary | ICD-10-CM

## 2019-02-08 DIAGNOSIS — F331 Major depressive disorder, recurrent, moderate: Secondary | ICD-10-CM | POA: Diagnosis not present

## 2019-02-08 NOTE — Progress Notes (Signed)
Virtual Visit via Telephone Note  I connected with Lindsay [name] on 02/08/19 at  9:00 AM EST by telephone and verified that I am speaking with the correct person using two identifiers.   I discussed the limitations, risks, security and privacy concerns of performing an evaluation and management service by telephone and the availability of in person appointments. I also discussed with the patient that there may be a patient responsible charge related to this service. The patient expressed understanding and agreed to proceed. I discussed the assessment and treatment plan with the patient. The patient was provided an opportunity to ask questions and all were answered. The patient agreed with the plan and demonstrated an understanding of the instructions.   The patient was advised to call back or seek an in-person evaluation if the symptoms worsen or if the condition fails to improve as anticipated.  I provided 52 minutes of non-face-to-face time during this encounter.  THERAPIST PROGRESS NOTE  Session Time: 9:03 AM to 9:55 AM  Participation Level: Active  Behavioral Response: CasualAlertDysphoric  Type of Therapy: Individual Therapy  Treatment Goals addressed:  Anxiety,Depression, self-esteem, coping  Interventions: Solution Focused, Strength-based, Supportive and Reframing, psychoeducation on marijuana, stress management, coping  Summary: Lindsay [name] is a 49 y.o. female who presents with struggling. One issues is dealing with her medical issues. Worried about getting basic necessities as a normal person. Daughter just having dystonic reactions for third time to medications. Going to call today at behavioral health to get help. Talking to doctor about edibles and explains that this has what she has used to manage stress and a lot of pain. Discussed doctor concern about this and patient recognizes his expertise to give her guidance with medications. Norco was helping but not enough now to  really help. Shares "I am not going to  ingest any medications that does not do my body any good." Says that if anything such as gummies or tea that would step the pain she would take it. Lower back causes lots of pain (she describes nocrotic syndrome) gets out of breath quickly. Shares she feels regressing and not progressing. Health declining. Started with a doctor (PCP) is getting ready to manage pain, on 13th. Drug test to get pain pill. Leaving pain clinic because way treating her. See what they can do until she can get to a more specialized pain management clinic. Only smoke two cigarettes a day. Discussed this as a major accomplishment. Discussed getting out of breath easily related to being overweight and compressed lungs, also respiratory failure.  Reviewed taking care of health as get older and patient shares "Every time pick myself I fall down again" descrbies feeing worn out and "dragged out". Shares news that daughter's grandfather passing away. Didn't sound good and started crying. "Tried to not take on things like that, I have enough already". Friend run over. All this going on after she talked to the doctor all happened within a week of talking to doctor. Discussed negative impact of marijuana and patient shares that it is not going to be hard to make a change. Need to make a change will make a change. Needs to be more disciplined about praying. Start back into religion believes that is where will get her best results, let God handle things but doesn't do that and takes it back. Review healthy diet and problem is drinking Dr. Malachi Bonds and eating candy. Realizes that she needs to pick up water instead of soda. Discussed basics to address stressors  related to having a bed where she not is sleeping close to the floor. Patient had suggestions of what she can look into and therapist will look as well.           Suicidal/Homicidal: No  Therapist Response: Reviewed symptoms, discussed stressors  facilitated expression of thoughts and feelings.  Therapist pointed out positive attitudes patient has about moving in the direction of healthier choices such as not wanting to ingest any medications that does not do her body good.  As discussed in general recognizing aging process and needing to take care of our bodies by making healthy choices and assess as a positive thing patient recognizes this.  Reviewed negative impact of smoking marijuana by providing education withdrawal causes anxiety, smoking not good for respiratory as she is inhaling a lot more carcinogenic's than cigarettes.  Of note assessed as positive patient reports not wanting to continue to smoke marijuana. Utilize reframing to help patient perceived things and hopeful will use such as starting with a new doctor to help with pain management.  Explored stress management, engaged in problem solving to address specific stressors of day-to-day living that would help with pain and alleviate stress and mental health symptoms.  Provided strength based and supportive intervention  Plan: Return again in 2 weeks.2.Complete assessment.3.Look into any programs that would donate furniture.3.Therapist work with patient on mood management, coping  Diagnosis: Axis I:  ajor depressive disorder, recurrent, moderate, generalized anxiety disorder    Axis II: No diagnosis    [name], LCSW 02/08/2019

## 2019-02-10 ENCOUNTER — Encounter: Payer: Self-pay | Admitting: Internal Medicine

## 2019-02-10 ENCOUNTER — Ambulatory Visit (INDEPENDENT_AMBULATORY_CARE_PROVIDER_SITE_OTHER): Payer: Medicare Other | Admitting: Internal Medicine

## 2019-02-10 ENCOUNTER — Other Ambulatory Visit: Payer: Self-pay

## 2019-02-10 VITALS — BP 161/86 | HR 92 | Temp 98.8°F | Ht 70.0 in | Wt 360.7 lb

## 2019-02-10 DIAGNOSIS — G8929 Other chronic pain: Secondary | ICD-10-CM | POA: Diagnosis not present

## 2019-02-10 DIAGNOSIS — M549 Dorsalgia, unspecified: Secondary | ICD-10-CM

## 2019-02-10 DIAGNOSIS — I10 Essential (primary) hypertension: Secondary | ICD-10-CM | POA: Diagnosis not present

## 2019-02-10 DIAGNOSIS — F329 Major depressive disorder, single episode, unspecified: Secondary | ICD-10-CM

## 2019-02-10 DIAGNOSIS — M79605 Pain in left leg: Secondary | ICD-10-CM

## 2019-02-10 DIAGNOSIS — M25562 Pain in left knee: Secondary | ICD-10-CM

## 2019-02-10 DIAGNOSIS — E559 Vitamin D deficiency, unspecified: Secondary | ICD-10-CM | POA: Diagnosis not present

## 2019-02-10 DIAGNOSIS — M25561 Pain in right knee: Secondary | ICD-10-CM | POA: Diagnosis not present

## 2019-02-10 DIAGNOSIS — Z720 Tobacco use: Secondary | ICD-10-CM

## 2019-02-10 DIAGNOSIS — Z79899 Other long term (current) drug therapy: Secondary | ICD-10-CM

## 2019-02-10 DIAGNOSIS — E611 Iron deficiency: Secondary | ICD-10-CM | POA: Diagnosis not present

## 2019-02-10 DIAGNOSIS — M797 Fibromyalgia: Secondary | ICD-10-CM | POA: Diagnosis not present

## 2019-02-10 DIAGNOSIS — Z9114 Patient's other noncompliance with medication regimen: Secondary | ICD-10-CM

## 2019-02-10 DIAGNOSIS — J9611 Chronic respiratory failure with hypoxia: Secondary | ICD-10-CM

## 2019-02-10 DIAGNOSIS — F419 Anxiety disorder, unspecified: Secondary | ICD-10-CM

## 2019-02-10 DIAGNOSIS — Z6841 Body Mass Index (BMI) 40.0 and over, adult: Secondary | ICD-10-CM

## 2019-02-10 MED ORDER — DULOXETINE HCL 60 MG PO CPEP: ORAL_CAPSULE | ORAL | 0 refills | Status: DC

## 2019-02-10 MED ORDER — DULOXETINE HCL 30 MG PO CPEP: ORAL_CAPSULE | ORAL | 0 refills | Status: DC

## 2019-02-10 MED ORDER — PREGABALIN 300 MG PO CAPS: 300.0000 mg | ORAL_CAPSULE | Freq: Two times a day (BID) | ORAL | 2 refills | Status: DC

## 2019-02-10 NOTE — Progress Notes (Signed)
Subjective:   Patient ID: Lindsay [name] female   DOB: 25-Jan-1971 49 y.o.   MRN: [id number]  HPI: [name] is a 49 y.o. female with past medical history outlined below here for follow up of chronic pain. For the details of today's visit, please refer to the assessment and plan.   Past Medical History:  Diagnosis Date  . Acid reflux   . Anemia    Iron Def  . Anorexia   . Chronic kidney disease    Nephrotic syndrome  . Colon polyp 2009  . Depression with anxiety   . Edema leg   . Fibromyalgia   . Hemorrhoids   . Hidradenitis suppurativa   . Hypertension   . IBS (irritable bowel syndrome)   . Low back pain   . Migraines   . Morbidly obese ([place])   . Neuromuscular disorder (HCC)    fibromyalgia  . Neuropathy   . Panic attacks   . Polyp of vocal cord or larynx   . Tonsil pain    Current Outpatient Medications  Medication Sig Dispense Refill  . albuterol (PROVENTIL) (2.5 MG/3ML) 0.083% nebulizer solution Take 3 mLs (2.5 mg total) by nebulization every 4 (four) hours. 75 mL 12  . albuterol (VENTOLIN HFA) 108 (90 Base) MCG/ACT inhaler TAKE 2 PUFFS BY MOUTH EVERY 6 HOURS AS NEEDED FOR WHEEZE OR SHORTNESS OF BREATH 18 g 0  . amLODipine (NORVASC) 10 MG tablet TAKE 1/2 TABLET BY MOUTH EVERY DAY (Patient taking differently: Take 5 mg by mouth daily. ) 45 tablet 0  . Ascorbic Acid (VITAMIN C PO) Take 1 tablet by mouth daily.    [name] BIOTIN PO Take 1 tablet by mouth daily.    . Brexpiprazole (REXULTI) 3 MG TABS Take 1 tablet (3 mg total) by mouth daily. 90 tablet 0  . Capsaicin 0.075 % GEL Apply 1 application topically 4 (four) times daily as needed. (Patient not taking: Reported on 12/23/2018) 30 g 0  . CHANTIX CONTINUING MONTH PAK 1 MG tablet TAKE 1 TABLET BY MOUTH TWICE A DAY 56 tablet 0  . cholecalciferol (VITAMIN D) 1000 units tablet Take 1 tablet (1,000 Units total) by mouth daily. 30 tablet 3  . clorazepate (TRANXENE) 7.5 MG tablet Take 1 tablet (7.5 mg total) by mouth daily.  30 tablet 1  . DULoxetine (CYMBALTA) 30 MG capsule Take once daily x 1 week then stop to complete taper 7 capsule 0  . hydrochlorothiazide (HYDRODIURIL) 25 MG tablet TAKE 1 TABLET BY MOUTH EVERY DAY (Patient not taking: Reported on 12/23/2018) 90 tablet 1  . HYDROcodone-acetaminophen (NORCO) 5-325 MG tablet Take 1 tablet by mouth every 12 (twelve) hours as needed for moderate pain. 30 tablet 0  . hydrOXYzine (VISTARIL) 50 MG capsule Take 1 capsule (50 mg total) by mouth at bedtime as needed for anxiety. 30 capsule 1  . iron polysaccharides (NU-IRON) 150 MG capsule Take 1 capsule (150 mg total) by mouth daily. 30 capsule 5  . lamoTRIgine (LAMICTAL) 25 MG tablet Take one tab daily for 2 one week and than two tab daily 60 tablet 1  . naproxen (NAPROSYN) 500 MG tablet Take 1 tablet (500 mg total) by mouth 2 (two) times daily as needed. 60 tablet 1  . omeprazole (PRILOSEC) 40 MG capsule Take 1 capsule (40 mg total) by mouth daily. 30 capsule 11  . OXYGEN Inhale 4 L into the lungs.    . pregabalin (LYRICA) 300 MG capsule Take 1 capsule (300 mg total) by  mouth 2 (two) times daily. 60 capsule 2  . Probiotic Product (PROBIOTIC PO) Take 1 capsule by mouth daily.    . promethazine (PHENERGAN) 12.5 MG tablet TAKE 1 TABLET (12.5 MG TOTAL) BY MOUTH EVERY 6 (SIX) HOURS AS NEEDED FOR NAUSEA OR VOMITING. 30 tablet 1  . SYMBICORT 160-4.5 MCG/ACT inhaler TAKE 2 PUFFS BY MOUTH TWICE A DAY 10.2 Inhaler 3  . valsartan (DIOVAN) 160 MG tablet Take 1 tablet (160 mg total) by mouth daily. 90 tablet 3  . valsartan (DIOVAN) 80 MG tablet Take 160 mg by mouth daily.    . vitamin B-12 (CYANOCOBALAMIN) 1000 MCG tablet Take 1 tablet (1,000 mcg total) by mouth daily. 30 tablet 3  . VITAMIN E PO Take 1 capsule by mouth daily.     Current Facility-Administered Medications  Medication Dose Route Frequency Provider Last Rate Last Admin  . ferumoxytol (FERAHEME) injection 510 mg  510 mg Intravenous Once Chundi, Verne Spurr, MD        Family History  Problem Relation Age of Onset  . Diabetes Mother   . Heart disease Mother        valve leak  . Anxiety disorder Mother   . Depression Mother   . High blood pressure Mother   . Kidney failure Mother   . Cancer Father        prostate  . Heart disease Father   . Learning disabilities Sister   . Depression Sister   . Depression Sister   . Anxiety disorder Sister   . Colon cancer Neg Hx    Social History   Socioeconomic History  . Marital status: Single    Spouse name: Not on file  . Number of children: 1  . Years of education: 7  . Highest education level: Not on file  Occupational History  . Occupation: Disabled  Tobacco Use  . Smoking status: Current Every Day Smoker    Packs/day: 0.10    Years: 25.00    Pack years: 2.50    Types: Cigarettes  . Smokeless tobacco: Never Used  . Tobacco comment: DOWN TO 1 A DAY  Substance and Sexual Activity  . Alcohol use: Not Currently    Alcohol/week: 0.0 standard drinks    Comment: none sine 05/2018  . Drug use: Yes    Frequency: 3.0 times per week    Types: Marijuana  . Sexual activity: Not Currently    Birth control/protection: Implant  Other Topics Concern  . Not on file  Social History Narrative   Lives at home w/ her mother and daughter   Right-handed   Caffeine: about 3 Cokes per week   Social Determinants of Health   Financial Resource Strain:   . Difficulty of Paying Living Expenses: Not on file  Food Insecurity:   . Worried About Charity fundraiser in the Last Year: Not on file  . Ran Out of Food in the Last Year: Not on file  Transportation Needs:   . Lack of Transportation (Medical): Not on file  . Lack of Transportation (Non-Medical): Not on file  Physical Activity:   . Days of Exercise per Week: Not on file  . Minutes of Exercise per Session: Not on file  Stress:   . Feeling of Stress : Not on file  Social Connections:   . Frequency of Communication with Friends and Family: Not on  file  . Frequency of Social Gatherings with Friends and Family: Not on file  . Attends Religious Services:  Not on file  . Active Member of Clubs or Organizations: Not on file  . Attends Archivist Meetings: Not on file  . Marital Status: Not on file    Review of Systems: Review of Systems  Respiratory: Negative for shortness of breath.   Cardiovascular: Negative for chest pain.  Musculoskeletal: Positive for back pain and joint pain. Negative for falls.     Objective:  Physical Exam:  Vitals:   02/10/19 0842  BP: (!) 161/86  Pulse: 92  Temp: 98.8 F (37.1 C)  TempSrc: Oral  SpO2: 98%  Weight: (!) 360 lb 11.2 oz (163.6 kg)  Height: 5\' 10"  (1.778 m)    Physical Exam  Constitutional: She is oriented to person, place, and time and well-developed, well-nourished, and in no distress.  Morbidly obese   Cardiovascular: Normal rate, regular rhythm and normal heart sounds.  Pulmonary/Chest: Effort normal and breath sounds normal. No respiratory distress.  Musculoskeletal:     Comments: Bilateral knees tender to palpation. Landmarks difficult to identify 2/2 to body habitus but no obvious joint effusion.   Neurological: She is alert and oriented to person, place, and time. No cranial nerve deficit.     Assessment & Plan:   See Encounters Tab for problem based charting.

## 2019-02-10 NOTE — Assessment & Plan Note (Signed)
Reports compliance with vit D supplements. Rechecking level today.

## 2019-02-10 NOTE — Assessment & Plan Note (Signed)
Rechecking ferritin.

## 2019-02-11 ENCOUNTER — Ambulatory Visit: Payer: Medicare Other

## 2019-02-11 ENCOUNTER — Encounter: Payer: Self-pay | Admitting: Internal Medicine

## 2019-02-11 ENCOUNTER — Telehealth: Payer: Self-pay | Admitting: Internal Medicine

## 2019-02-11 LAB — BMP8+ANION GAP
Anion Gap: 15 mmol/L (ref 10.0–18.0)
BUN/Creatinine Ratio: 13 (ref 9–23)
BUN: 9 mg/dL (ref 6–24)
CO2: 22 mmol/L (ref 20–29)
Calcium: 9 mg/dL (ref 8.7–10.2)
Chloride: 104 mmol/L (ref 96–106)
Creatinine, Ser: 0.71 mg/dL (ref 0.57–1.00)
GFR calc Af Amer: 116 mL/min/{1.73_m2} (ref >59)
GFR calc non Af Amer: 100 mL/min/{1.73_m2} (ref >59)
Glucose: 63 mg/dL — ABNORMAL LOW (ref 65–99)
Potassium: 4.3 mmol/L (ref 3.5–5.2)
Sodium: 141 mmol/L (ref 134–144)

## 2019-02-11 LAB — CBC
Hematocrit: 47.3 % — ABNORMAL HIGH (ref 34.0–46.6)
Hemoglobin: 14.7 g/dL (ref 11.1–15.9)
MCH: 27 pg (ref 26.6–33.0)
MCHC: 31.1 g/dL — ABNORMAL LOW (ref 31.5–35.7)
MCV: 87 fL (ref 79–97)
Platelets: 374 10*3/uL (ref 150–450)
RBC: 5.44 x10E6/uL — ABNORMAL HIGH (ref 3.77–5.28)
RDW: 15.2 % (ref 11.7–15.4)
WBC: 7 10*3/uL (ref 3.4–10.8)

## 2019-02-11 LAB — FERRITIN: Ferritin: 26 ng/mL (ref 15–150)

## 2019-02-11 LAB — VITAMIN D 25 HYDROXY (VIT D DEFICIENCY, FRACTURES): Vit D, 25-Hydroxy: 21.9 ng/mL — ABNORMAL LOW (ref 30.0–100.0)

## 2019-02-11 NOTE — Telephone Encounter (Signed)
Called pt - c/o sharp, shooting pain in her knees esp right knee after receiving injections yesterday; stated pain started last night Thanks

## 2019-02-11 NOTE — Telephone Encounter (Signed)
Pr call to report excruciating pain in both legs since her injections in her knee at yesterday's 02/10/2019 OV with Dr. Philipp Ovens.  Pt requesting a call back as soon as possible.

## 2019-02-11 NOTE — Assessment & Plan Note (Signed)
Uncontrolled today. Has not taken her medications this morning. Emphasized the importance of compliance.

## 2019-02-11 NOTE — Telephone Encounter (Signed)
[facility] appt scheduled tomorrow @ [id number] AM; stated she call if she's unable to come.

## 2019-02-11 NOTE — Telephone Encounter (Signed)
Please have her schedule [facility] appointment to be evaluated in person if pain persists.

## 2019-02-11 NOTE — Telephone Encounter (Signed)
No available appts today.  Called pt - stated she will not have transportation for today. Advised pt to go to Urgent Care.  Talked to Dr Philipp Ovens - stated to add pt to [facility].  Called pt back - stated she does not want to go to UC d/t close space. She told her aide not to come today; so no transportation to the clinic.  I asked if she can come tomorrow - stated she will see if her aide can bring her and call us back.

## 2019-02-11 NOTE — Assessment & Plan Note (Addendum)
Patient is here for chronic pain follow up. She has difficult to controlled fibromyalgia that I think is complicated by poor coping skills and poorly controlled anxiety and depression. We have tried numerous pharmacologic therapies without benefit. She is currently on lyrica and duloxetine. I had previously referred her to a pain clinic but she was unhappy with her care and declined to follow up. I had finally agreed to a trial of opioids under the agreement that she would stop using THC products, but this was prior to me realizing she had been off her lyrica for 6 months (see prior notes). I have had suspicion for awhile now that she is non compliant or at least inconsistent in her current therapies. She was seen by psychiatry last week. Due to [facility] in her urine, she is being weaned off her benzodiazepine and was started on lamictal instead. Pyschiatry discussed the possibility of starting another antidepressant if she is able to be weaned off duloxetine. Because she has not noticed any benefit with cymbalta, I agree with titrating this off today. She has not followed with neurology since her normal EMG studies; her Cymbalta and lyrica are managed by me. She is currently taking lyrica 100 mg TID but does not think this is helping with her pain either. We will increase this to 300 mg BID. Today she is mainly complaining of pain in her bilateral knees. We discussed the importance of weight loss for her joints, current BMI is 51. She was also agreeable to trying bilateral knee injections today for pain relief.   Plan:  -- Taper off duloxetine; decrease to 60 mg once a day x 1 week, then 30 mg once a day x 1 week, then stop  -- Increase Lyrica to 300 mg BID -- Moving forward will continue to avoid opioid therapy if possible  -- Bilateral knee injections today; procedure note below -- Recommended she attend the bariatric surgery seminar at [facility]  -- I agree with tapering off her benzodiazepine but will  defer this to psychiatry  -- Follow up psychiatry for depression and anxiety; I think this is complicating her chronic pain picture and should definitely take priority    Procedure Note  Indication:  Bilateral Knee Pain 2/2 osteoarthritis   Operator: Dr. Philipp Ovens  The patient was provided with risks, benefits, and alternatives to intraarticular injection. She consented to intraarticular knee injections for her bilateral knee pain.  After a time out was preformed, both knees were prepped in a sterile fashion. A mixture of 1 cc 1% lidocaine and 40 mg of Kenalog were injected into both knees using a 25 gauge 1 and 1/2 inch needle.  The needle was inserted into the lateral inferior patellar aspect of the knee. The left knee space was entered successfully on the first attempt, the right knee space entered successfully on the second attempt. The patient tolerated the procedures well without complication.

## 2019-02-12 ENCOUNTER — Ambulatory Visit: Payer: Medicare Other

## 2019-02-12 ENCOUNTER — Encounter: Payer: Self-pay | Admitting: Internal Medicine

## 2019-02-16 ENCOUNTER — Other Ambulatory Visit: Payer: Self-pay | Admitting: Internal Medicine

## 2019-02-16 DIAGNOSIS — I10 Essential (primary) hypertension: Secondary | ICD-10-CM

## 2019-02-19 ENCOUNTER — Ambulatory Visit: Payer: Medicare Other

## 2019-02-20 ENCOUNTER — Other Ambulatory Visit: Payer: Self-pay | Admitting: Pulmonary Disease

## 2019-02-22 ENCOUNTER — Other Ambulatory Visit: Payer: Self-pay

## 2019-02-22 ENCOUNTER — Ambulatory Visit (HOSPITAL_COMMUNITY): Payer: Medicare Other | Admitting: Licensed Clinical Social Worker

## 2019-02-22 DIAGNOSIS — J9620 Acute and chronic respiratory failure, unspecified whether with hypoxia or hypercapnia: Secondary | ICD-10-CM | POA: Diagnosis not present

## 2019-02-23 ENCOUNTER — Telehealth: Payer: Self-pay | Admitting: *Deleted

## 2019-02-23 ENCOUNTER — Telehealth (HOSPITAL_COMMUNITY): Payer: Self-pay | Admitting: *Deleted

## 2019-02-23 DIAGNOSIS — F411 Generalized anxiety disorder: Secondary | ICD-10-CM

## 2019-02-23 NOTE — Telephone Encounter (Signed)
Pt calls and can barely talk, she states she has severe congestion in her head and when checks Temp 99.0 to 99.8 oral, states her resp. Are more labored. She also talks about shes worried because dr Philipp Ovens is changing her meds so much, states she called and spoke to a nurse with dr Adele Schilder and he is supposed to be looking at her psych meds.  [facility] 1/29 telehealth at [place] pt ph# for appt to have COVID testing but she is not sure she is going to do that.

## 2019-02-23 NOTE — Telephone Encounter (Signed)
Pt called c/o "night terrors" and "talking out in my sleep" which is waking her up. As you are aware she is getting Lyrica and Cymbalta from her primary Dr. Velna [name]. Pt was very difficult to understand due to poor connection and respiratory issues. Seems she would like medication for "night terrors" and for you to coordinate care with PCP. Provider notes ae in Epic.Please review and advise.

## 2019-02-24 ENCOUNTER — Ambulatory Visit (INDEPENDENT_AMBULATORY_CARE_PROVIDER_SITE_OTHER): Payer: Medicare Other | Admitting: Internal Medicine

## 2019-02-24 ENCOUNTER — Other Ambulatory Visit: Payer: Self-pay | Admitting: Pulmonary Disease

## 2019-02-24 ENCOUNTER — Other Ambulatory Visit: Payer: Self-pay

## 2019-02-24 DIAGNOSIS — B3731 Acute candidiasis of vulva and vagina: Secondary | ICD-10-CM

## 2019-02-24 DIAGNOSIS — R0981 Nasal congestion: Secondary | ICD-10-CM

## 2019-02-24 DIAGNOSIS — B373 Candidiasis of vulva and vagina: Secondary | ICD-10-CM

## 2019-02-24 DIAGNOSIS — J449 Chronic obstructive pulmonary disease, unspecified: Secondary | ICD-10-CM | POA: Diagnosis not present

## 2019-02-24 DIAGNOSIS — F411 Generalized anxiety disorder: Secondary | ICD-10-CM

## 2019-02-24 DIAGNOSIS — J9610 Chronic respiratory failure, unspecified whether with hypoxia or hypercapnia: Secondary | ICD-10-CM

## 2019-02-24 DIAGNOSIS — Z79899 Other long term (current) drug therapy: Secondary | ICD-10-CM

## 2019-02-24 DIAGNOSIS — J069 Acute upper respiratory infection, unspecified: Secondary | ICD-10-CM

## 2019-02-24 DIAGNOSIS — Z9981 Dependence on supplemental oxygen: Secondary | ICD-10-CM

## 2019-02-24 MED ORDER — ESCITALOPRAM OXALATE 10 MG PO TABS: ORAL_TABLET | ORAL | 0 refills | Status: DC

## 2019-02-24 NOTE — Telephone Encounter (Signed)
Thank you Lindsay [name]. With her chronic hypoxia she needs to have a low threshold to come in for evaluation if her breathing gets worse.

## 2019-02-24 NOTE — Assessment & Plan Note (Signed)
49 yo female with chronic respiratory failure (4LNC) and COPD who requested a televisit for 2w history of sinus congestion. Endorses clear nasal drainage. Denies fever, cough, shortness of breath, n/v/d, headache or rash. She has been using OTC medications with some relief. No recent known sick contacts. Notes she was tested for COVID on 1/30 which was negative. Assessment: likely post viral. May be bacterial however she is already on amoxacillin for a dental infection so she should be covered from that standpoint as well. Plan: discussed conservative management. Encouraged her to obtain another COVID test to rule that out. Instructed to report to the ED if she develops SOB or other worsening symptoms.

## 2019-02-24 NOTE — Progress Notes (Signed)
[facility] Internal Medicine Residency Telephone Encounter Continuity Care Appointment  HPI:   This telephone encounter was created for Lindsay [name] on 02/24/2019 for the following purpose/cc nasal congestion, yeast infection.   Past Medical History:  Past Medical History:  Diagnosis Date  . Acid reflux   . Anemia    Iron Def  . Anorexia   . Chronic kidney disease    Nephrotic syndrome  . Colon polyp 2009  . Depression with anxiety   . Edema leg   . Fibromyalgia   . Hemorrhoids   . Hidradenitis suppurativa   . Hypertension   . IBS (irritable bowel syndrome)   . Low back pain   . Migraines   . Morbidly obese ([place])   . Neuromuscular disorder (HCC)    fibromyalgia  . Neuropathy   . Panic attacks   . Polyp of vocal cord or larynx   . Tonsil pain       ROS:  Review of Systems - General ROS: negative for - chills or fever ENT ROS: positive for - nasal congestion Respiratory ROS: negative for - cough, shortness of breath, sputum changes, tachypnea or wheezing Gastrointestinal ROS: negative for - abdominal pain, diarrhea or nausea/vomiting Genito-Urinary ROS: positive for - vulvar/vaginal symptoms negative for - urinary frequency/urgency    Assessment / Plan / Recommendations:  1. Sinus congestion. 49 yo female with chronic respiratory failure (4LNC) and COPD who requested a televisit for 2w history of sinus congestion. Endorses clear nasal drainage. Denies fever, cough, shortness of breath, n/v/d, headache or rash. She has been using OTC medications with some relief. No recent known sick contacts. Notes she was tested for COVID on 1/30 which was negative. Assessment: likely post viral. May be bacterial however she is already on amoxacillin for a dental infection so she should be covered from that standpoint as well. Plan: discussed conservative management. Encouraged her to obtain another COVID test to rule that out. Instructed to report to the ED if she develops SOB  or other worsening symptoms.  2. Vaginal yeast infection. Patient notes symptoms began after starting amoxacillin for a dental infection. Endorses recurrent yeast infections with antibiotic use and typically takes fluconazole on day #1 and #3 which resolves it. She has tried monistat without relief.  Plan: will send in 2 doses of fluconazole to be taken on day #1 and #3 and instructed her to let us know if symptoms do not resolve.  3. GAD. Patient notes that several of her psychiatric medications including Cymbalta, lyrica and klonopin were recently changed as well as starting lamotrigine. Since that time, she has been talking in her sleep but not acting out her dreams. I told her I would discuss this with her PCP Dr. Philipp Ovens and have her follow up with Vincente Liberty to avoid to many people adjusting her medications.  As always, pt is advised that if symptoms worsen or new symptoms arise, they should go to an urgent care facility or to to ER for further evaluation.   Consent and Medical Decision Making:   Patient discussed with Dr. Dareen Piano  This is a telephone encounter between Lindsay [name] and Afomia Blackley on 02/24/2019 for nasal congestion and yeast infection. The visit was conducted with the patient located at home and Lindsay [name] at [facility]. The patient's identity was confirmed using their DOB and current address. The patient has consented to being evaluated through a telephone encounter and understands the associated risks (an examination cannot be done and the patient  may need to come in for an appointment) / benefits (allows the patient to remain at home, decreasing exposure to coronavirus). I personally spent 15 minutes on medical discussion.    Mitzi Hansen, MD Internal medicine Resident-PGY1 02/24/19

## 2019-02-24 NOTE — Assessment & Plan Note (Signed)
Patient notes symptoms began after starting amoxacillin for a dental infection. Endorses recurrent yeast infections with antibiotic use and typically takes fluconazole on day #1 and #3 which resolves it. She has tried monistat without relief.  Plan: will send in 2 doses of fluconazole to be taken on day #1 and #3 and instructed her to let us know if symptoms do not resolve.

## 2019-02-24 NOTE — Telephone Encounter (Signed)
I returned patient's phone call.  She is no longer on Cymbalta which was weaned off from her PCP.  She is now taking higher dose of Lyrica.  She is having a lot of anxiety, night terrors along with symptoms of congestion, low-grade fever, sometimes difficulty breathing and chronic pain.  She is taking Lamictal 50 mg without any side effects.  She noticed marginal improvement in her depression but feels very nervous and anxious.  I discussed her anxiety could be withdrawal from Cymbalta since she had stopped after weaned.  In the past she had tried Paxil and Zoloft with poor outcome.  She recalled good response with Lexapro but not sure why it was discontinued.  I recommend to start Lexapro 10 mg a day but she will take half tablet for few days to avoid any side effects.  Continue Lamictal 50 mg daily since it is helping her depression.  I also encouraged that she should get Covid test since she had low-grade fever, congestion and difficulty breathing.  She agreed with the plan.  I have called Lexapro to her pharmacy.  I recommend if symptoms do not improve then she should call us back.  I would also forward my note to her PCP.

## 2019-02-24 NOTE — Assessment & Plan Note (Signed)
Patient notes that several of her psychiatric medications including symbalta, lyrica and klonapin were recently changed as well as starting lamotragine. Since that time, she has been talking in her sleep but not acting out her dreams. I told her I would discuss this with her PCP Dr. Philipp Ovens and have her follow up with Vincente Liberty to avoid to many people adjusting her medications.

## 2019-02-25 DIAGNOSIS — J9620 Acute and chronic respiratory failure, unspecified whether with hypoxia or hypercapnia: Secondary | ICD-10-CM | POA: Diagnosis not present

## 2019-02-25 NOTE — Progress Notes (Signed)
Internal Medicine Clinic Attending  Case discussed with Dr. Christian at the time of the visit.  We reviewed the resident's history and exam and pertinent patient test results.  I agree with the assessment, diagnosis, and plan of care documented in the resident's note.    

## 2019-02-26 ENCOUNTER — Other Ambulatory Visit (HOSPITAL_COMMUNITY): Payer: Self-pay | Admitting: Psychiatry

## 2019-02-26 ENCOUNTER — Ambulatory Visit (INDEPENDENT_AMBULATORY_CARE_PROVIDER_SITE_OTHER): Payer: Medicare Other | Admitting: Licensed Clinical Social Worker

## 2019-02-26 ENCOUNTER — Other Ambulatory Visit: Payer: Self-pay

## 2019-02-26 DIAGNOSIS — F331 Major depressive disorder, recurrent, moderate: Secondary | ICD-10-CM | POA: Diagnosis not present

## 2019-02-26 DIAGNOSIS — F411 Generalized anxiety disorder: Secondary | ICD-10-CM

## 2019-02-26 NOTE — Progress Notes (Signed)
Virtual Visit via Telephone Note  I connected with [name] on 02/26/19 at  8:00 AM EST by telephone and verified that I am speaking with the correct person using two identifiers.   I discussed the limitations, risks, security and privacy concerns of performing an evaluation and management service by telephone and the availability of in person appointments. I also discussed with the patient that there may be a patient responsible charge related to this service. The patient expressed understanding and agreed to proceed.   I discussed the assessment and treatment plan with the patient. The patient was provided an opportunity to ask questions and all were answered. The patient agreed with the plan and demonstrated an understanding of the instructions.   The patient was advised to call back or seek an in-person evaluation if the symptoms worsen or if the condition fails to improve as anticipated.  I provided 16 minutes of non-face-to-face time during this encounter.  THERAPIST PROGRESS NOTE  Session Time: 8:00 AM to 8:16 AM  Participation Level: Active  Behavioral Response: CasualAlertAnxious  Type of Therapy: Individual Therapy  Treatment Goals addressed:  Anxiety,Depression, self-esteem, coping Interventions: Solution Focused, Strength-based, Supportive and Other: coping  Summary: Lindsay [name] is a 49 y.o. female who presents with having chills, temperature about 99 F, sharp pains in head, shortness of breath and on oxygen.  She is going to go to the hospital her nurses aide is coming at 54 to take her.  Patient shared she has a lot talk about friend was run over, having night terrors she believes because of changes in medication.  Relates off of Cymbalta cutting down on Tranxene, want to start Lexapro.  Shares she is scared with all these changes in medication.  Therapist assured patient that the doctors goal is to improve her symptoms, he is there to help her feel better, as well as  therapist goal.  That in this process it may take a while to get there.  Patient provided positive response to therapist input indicating treatment team has the school.  Therapist encourage patient to rest and relax, and due to shortness of breath difficulty in communicating with therapist also on oxygen and difficulty here patient session was ended early so patient could rest.  Suicidal/Homicidal: No  Plan: Return again in 2 weeks.  Diagnosis: Axis I: Major depressive disorder, recurrent, moderate, generalized anxiety disorder    Axis II: No diagnosis    [name], LCSW 02/26/2019

## 2019-03-06 ENCOUNTER — Other Ambulatory Visit: Payer: Self-pay

## 2019-03-06 ENCOUNTER — Emergency Department (HOSPITAL_COMMUNITY): Payer: Medicare Other

## 2019-03-06 ENCOUNTER — Encounter (HOSPITAL_COMMUNITY): Payer: Self-pay | Admitting: Emergency Medicine

## 2019-03-06 ENCOUNTER — Inpatient Hospital Stay (HOSPITAL_COMMUNITY)
Admission: EM | Admit: 2019-03-06 | Discharge: 2019-03-08 | DRG: 542 | Disposition: A | Discharge status: Home / Self Care | Payer: Medicare Other | Attending: Student in an Organized Health Care Education/Training Program | Admitting: Student in an Organized Health Care Education/Training Program

## 2019-03-06 DIAGNOSIS — M8000XA Age-related osteoporosis with current pathological fracture, unspecified site, initial encounter for fracture: Secondary | ICD-10-CM | POA: Diagnosis not present

## 2019-03-06 DIAGNOSIS — F418 Other specified anxiety disorders: Secondary | ICD-10-CM | POA: Diagnosis present

## 2019-03-06 DIAGNOSIS — K219 Gastro-esophageal reflux disease without esophagitis: Secondary | ICD-10-CM | POA: Diagnosis present

## 2019-03-06 DIAGNOSIS — S22030A Wedge compression fracture of third thoracic vertebra, initial encounter for closed fracture: Secondary | ICD-10-CM | POA: Diagnosis not present

## 2019-03-06 DIAGNOSIS — R7989 Other specified abnormal findings of blood chemistry: Secondary | ICD-10-CM | POA: Diagnosis not present

## 2019-03-06 DIAGNOSIS — J309 Allergic rhinitis, unspecified: Secondary | ICD-10-CM | POA: Diagnosis present

## 2019-03-06 DIAGNOSIS — I10 Essential (primary) hypertension: Secondary | ICD-10-CM | POA: Diagnosis not present

## 2019-03-06 DIAGNOSIS — Z9981 Dependence on supplemental oxygen: Secondary | ICD-10-CM

## 2019-03-06 DIAGNOSIS — Z833 Family history of diabetes mellitus: Secondary | ICD-10-CM

## 2019-03-06 DIAGNOSIS — F1290 Cannabis use, unspecified, uncomplicated: Secondary | ICD-10-CM | POA: Diagnosis present

## 2019-03-06 DIAGNOSIS — R0602 Shortness of breath: Secondary | ICD-10-CM | POA: Diagnosis not present

## 2019-03-06 DIAGNOSIS — G8929 Other chronic pain: Secondary | ICD-10-CM | POA: Diagnosis not present

## 2019-03-06 DIAGNOSIS — J449 Chronic obstructive pulmonary disease, unspecified: Secondary | ICD-10-CM | POA: Diagnosis not present

## 2019-03-06 DIAGNOSIS — Z6841 Body Mass Index (BMI) 40.0 and over, adult: Secondary | ICD-10-CM | POA: Diagnosis not present

## 2019-03-06 DIAGNOSIS — Z818 Family history of other mental and behavioral disorders: Secondary | ICD-10-CM

## 2019-03-06 DIAGNOSIS — I2721 Secondary pulmonary arterial hypertension: Secondary | ICD-10-CM | POA: Diagnosis not present

## 2019-03-06 DIAGNOSIS — J9600 Acute respiratory failure, unspecified whether with hypoxia or hypercapnia: Secondary | ICD-10-CM | POA: Diagnosis present

## 2019-03-06 DIAGNOSIS — Z20822 Contact with and (suspected) exposure to covid-19: Secondary | ICD-10-CM | POA: Diagnosis present

## 2019-03-06 DIAGNOSIS — Z8249 Family history of ischemic heart disease and other diseases of the circulatory system: Secondary | ICD-10-CM | POA: Diagnosis not present

## 2019-03-06 DIAGNOSIS — F419 Anxiety disorder, unspecified: Secondary | ICD-10-CM | POA: Diagnosis not present

## 2019-03-06 DIAGNOSIS — Z888 Allergy status to other drugs, medicaments and biological substances status: Secondary | ICD-10-CM | POA: Diagnosis not present

## 2019-03-06 DIAGNOSIS — Z79899 Other long term (current) drug therapy: Secondary | ICD-10-CM | POA: Diagnosis not present

## 2019-03-06 DIAGNOSIS — R0789 Other chest pain: Secondary | ICD-10-CM | POA: Diagnosis not present

## 2019-03-06 DIAGNOSIS — M797 Fibromyalgia: Secondary | ICD-10-CM | POA: Diagnosis present

## 2019-03-06 DIAGNOSIS — K589 Irritable bowel syndrome without diarrhea: Secondary | ICD-10-CM | POA: Diagnosis not present

## 2019-03-06 DIAGNOSIS — E8770 Fluid overload, unspecified: Secondary | ICD-10-CM | POA: Diagnosis not present

## 2019-03-06 DIAGNOSIS — S22000A Wedge compression fracture of unspecified thoracic vertebra, initial encounter for closed fracture: Secondary | ICD-10-CM | POA: Insufficient documentation

## 2019-03-06 DIAGNOSIS — M4854XA Collapsed vertebra, not elsewhere classified, thoracic region, initial encounter for fracture: Principal | ICD-10-CM | POA: Diagnosis present

## 2019-03-06 DIAGNOSIS — J9601 Acute respiratory failure with hypoxia: Secondary | ICD-10-CM | POA: Diagnosis not present

## 2019-03-06 DIAGNOSIS — Z7951 Long term (current) use of inhaled steroids: Secondary | ICD-10-CM | POA: Diagnosis not present

## 2019-03-06 DIAGNOSIS — J9611 Chronic respiratory failure with hypoxia: Secondary | ICD-10-CM

## 2019-03-06 DIAGNOSIS — M8448XA Pathological fracture, other site, initial encounter for fracture: Secondary | ICD-10-CM | POA: Diagnosis not present

## 2019-03-06 DIAGNOSIS — J9621 Acute and chronic respiratory failure with hypoxia: Secondary | ICD-10-CM | POA: Diagnosis not present

## 2019-03-06 DIAGNOSIS — J9690 Respiratory failure, unspecified, unspecified whether with hypoxia or hypercapnia: Secondary | ICD-10-CM | POA: Diagnosis not present

## 2019-03-06 DIAGNOSIS — R079 Chest pain, unspecified: Secondary | ICD-10-CM | POA: Diagnosis not present

## 2019-03-06 DIAGNOSIS — Z791 Long term (current) use of non-steroidal anti-inflammatories (NSAID): Secondary | ICD-10-CM

## 2019-03-06 DIAGNOSIS — R0781 Pleurodynia: Secondary | ICD-10-CM | POA: Diagnosis not present

## 2019-03-06 DIAGNOSIS — F17210 Nicotine dependence, cigarettes, uncomplicated: Secondary | ICD-10-CM | POA: Diagnosis present

## 2019-03-06 DIAGNOSIS — F329 Major depressive disorder, single episode, unspecified: Secondary | ICD-10-CM | POA: Diagnosis not present

## 2019-03-06 LAB — CBC
HCT: 48.4 % — ABNORMAL HIGH (ref 36.0–46.0)
HCT: 47.9 % — ABNORMAL HIGH (ref 36.0–46.0)
Hemoglobin: 15 g/dL (ref 12.0–15.0)
Hemoglobin: 14.8 g/dL (ref 12.0–15.0)
MCH: 26.6 pg (ref 26.0–34.0)
MCH: 26.2 pg (ref 26.0–34.0)
MCHC: 31 g/dL (ref 30.0–36.0)
MCHC: 30.9 g/dL (ref 30.0–36.0)
MCV: 85.8 fL (ref 80.0–100.0)
MCV: 84.9 fL (ref 80.0–100.0)
Platelets: 500 10*3/uL — ABNORMAL HIGH (ref 150–400)
Platelets: 469 10*3/uL — ABNORMAL HIGH (ref 150–400)
RBC: 5.64 MIL/uL — ABNORMAL HIGH (ref 3.87–5.11)
RBC: 5.64 MIL/uL — ABNORMAL HIGH (ref 3.87–5.11)
RDW: 17.8 % — ABNORMAL HIGH (ref 11.5–15.5)
RDW: 17 % — ABNORMAL HIGH (ref 11.5–15.5)
WBC: 6.9 10*3/uL (ref 4.0–10.5)
WBC: 7.8 10*3/uL (ref 4.0–10.5)
nRBC: 0.3 % — ABNORMAL HIGH (ref 0.0–0.2)
nRBC: 0 % (ref 0.0–0.2)

## 2019-03-06 LAB — HEPATIC FUNCTION PANEL
ALT: 20 U/L (ref 0–44)
AST: 16 U/L (ref 15–41)
Albumin: 3.8 g/dL (ref 3.5–5.0)
Alkaline Phosphatase: 67 U/L (ref 38–126)
Bilirubin, Direct: <0.1 mg/dL (ref 0.0–0.2)
Total Bilirubin: 0.7 mg/dL (ref 0.3–1.2)
Total Protein: 7.2 g/dL (ref 6.5–8.1)

## 2019-03-06 LAB — D-DIMER, QUANTITATIVE: D-Dimer, Quant: 0.92 ug/mL-FEU — ABNORMAL HIGH (ref 0.00–0.50)

## 2019-03-06 LAB — CREATININE, SERUM
Creatinine, Ser: 0.96 mg/dL (ref 0.44–1.00)
GFR calc Af Amer: >60 mL/min (ref >60)
GFR calc non Af Amer: >60 mL/min (ref >60)

## 2019-03-06 LAB — I-STAT BETA HCG BLOOD, ED (MC, WL, AP ONLY): I-stat hCG, quantitative: <5 m[IU]/mL (ref <5)

## 2019-03-06 LAB — BASIC METABOLIC PANEL
Anion gap: 11 (ref 5–15)
BUN: <5 mg/dL — ABNORMAL LOW (ref 6–20)
CO2: 26 mmol/L (ref 22–32)
Calcium: 9.1 mg/dL (ref 8.9–10.3)
Chloride: 102 mmol/L (ref 98–111)
Creatinine, Ser: 0.95 mg/dL (ref 0.44–1.00)
GFR calc Af Amer: >60 mL/min (ref >60)
GFR calc non Af Amer: >60 mL/min (ref >60)
Glucose, Bld: 91 mg/dL (ref 70–99)
Potassium: 3.5 mmol/L (ref 3.5–5.1)
Sodium: 139 mmol/L (ref 135–145)

## 2019-03-06 LAB — BRAIN NATRIURETIC PEPTIDE: B Natriuretic Peptide: 332.8 pg/mL — ABNORMAL HIGH (ref 0.0–100.0)

## 2019-03-06 LAB — RESPIRATORY PANEL BY RT PCR (FLU A&B, COVID)
Influenza A by PCR: NEGATIVE
Influenza B by PCR: NEGATIVE
SARS Coronavirus 2 by RT PCR: NEGATIVE

## 2019-03-06 LAB — TROPONIN I (HIGH SENSITIVITY)
Troponin I (High Sensitivity): 15 ng/L (ref <18)
Troponin I (High Sensitivity): 14 ng/L (ref <18)

## 2019-03-06 LAB — HIV ANTIBODY (ROUTINE TESTING W REFLEX): HIV Screen 4th Generation wRfx: NONREACTIVE

## 2019-03-06 IMAGING — CR DG CHEST 2V
2 series · 2 of 2 positions shown · non-contrast
Comparison: [DATE].

CLINICAL DATA: Chest pain.

EXAM:
CHEST - 2 VIEW

[chest lat]
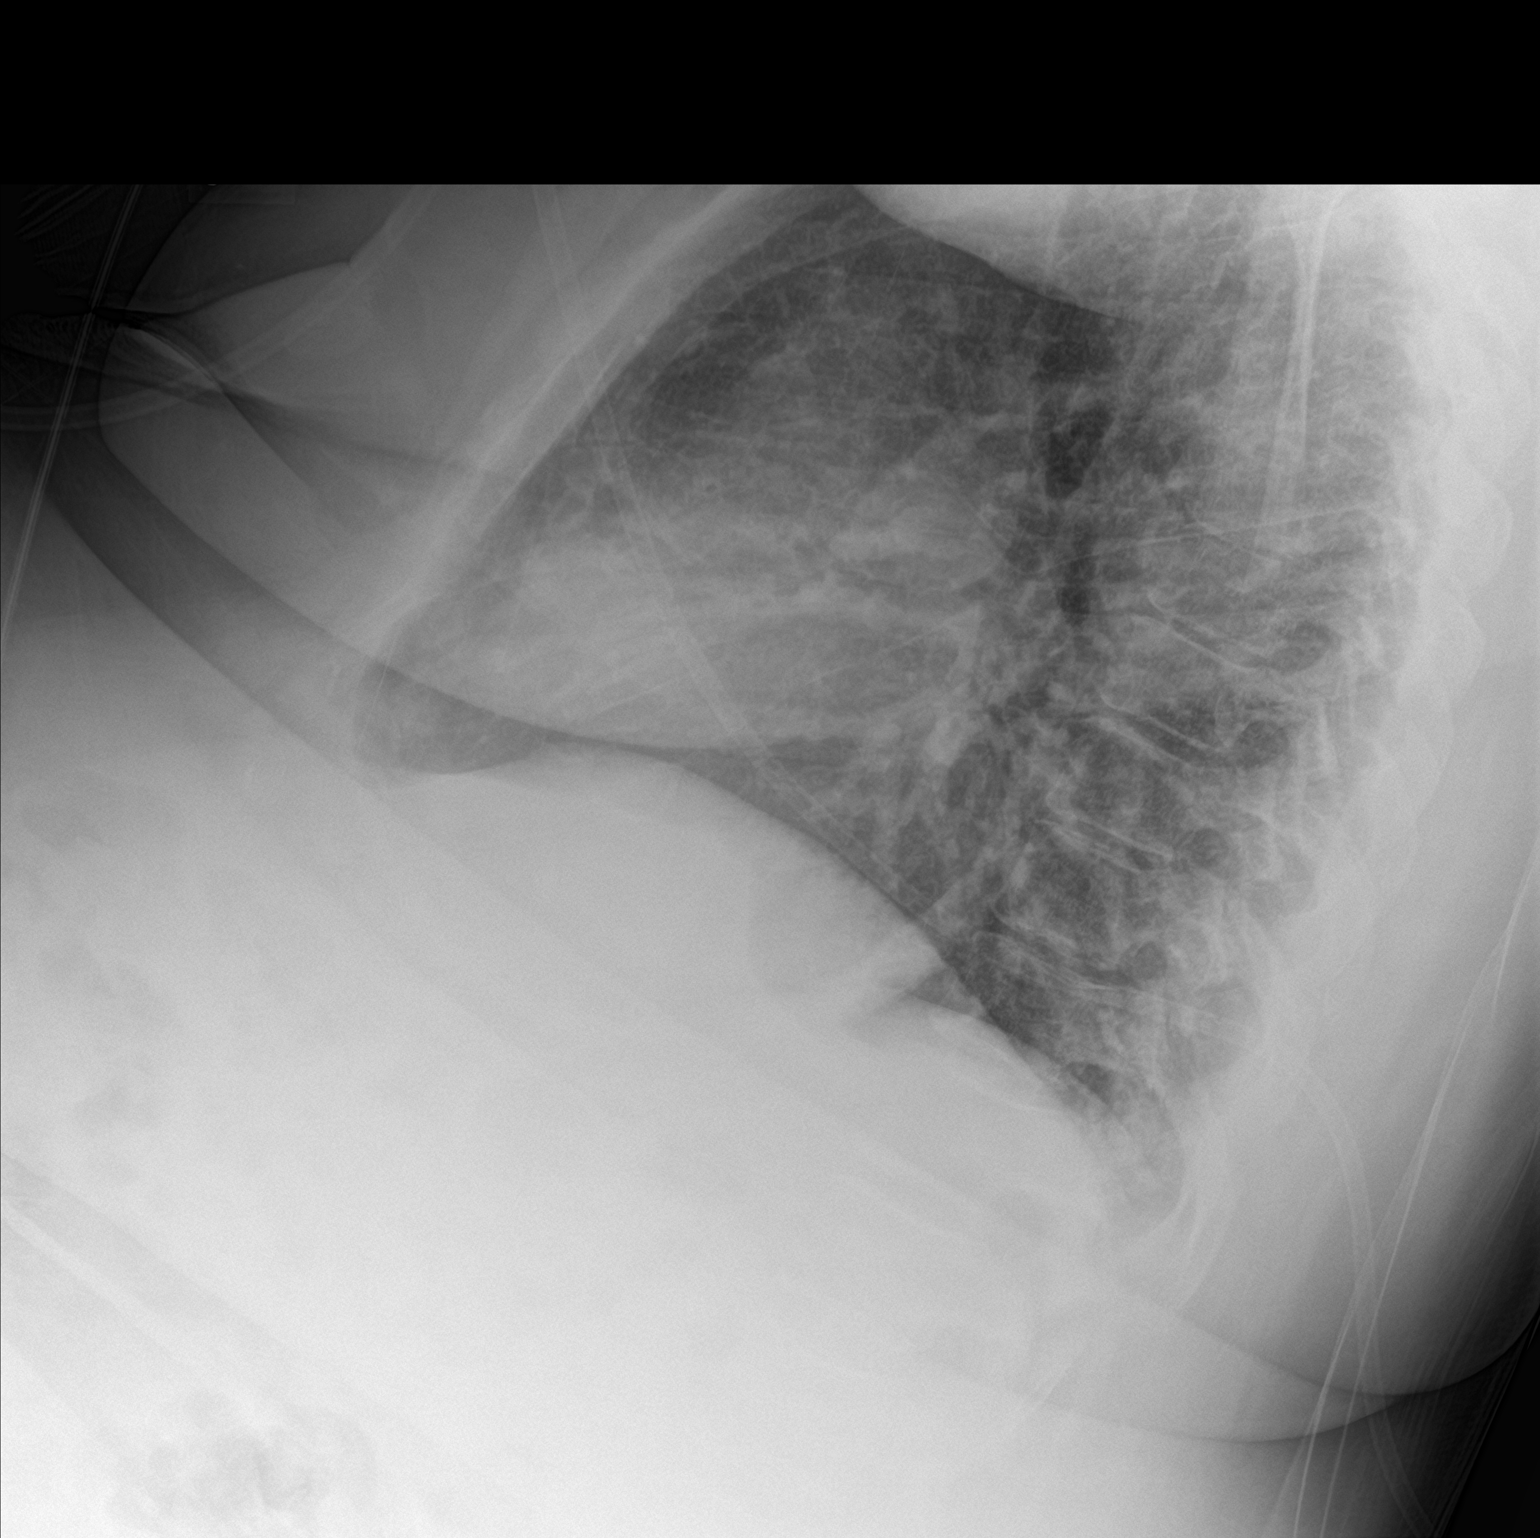

[chest ap]
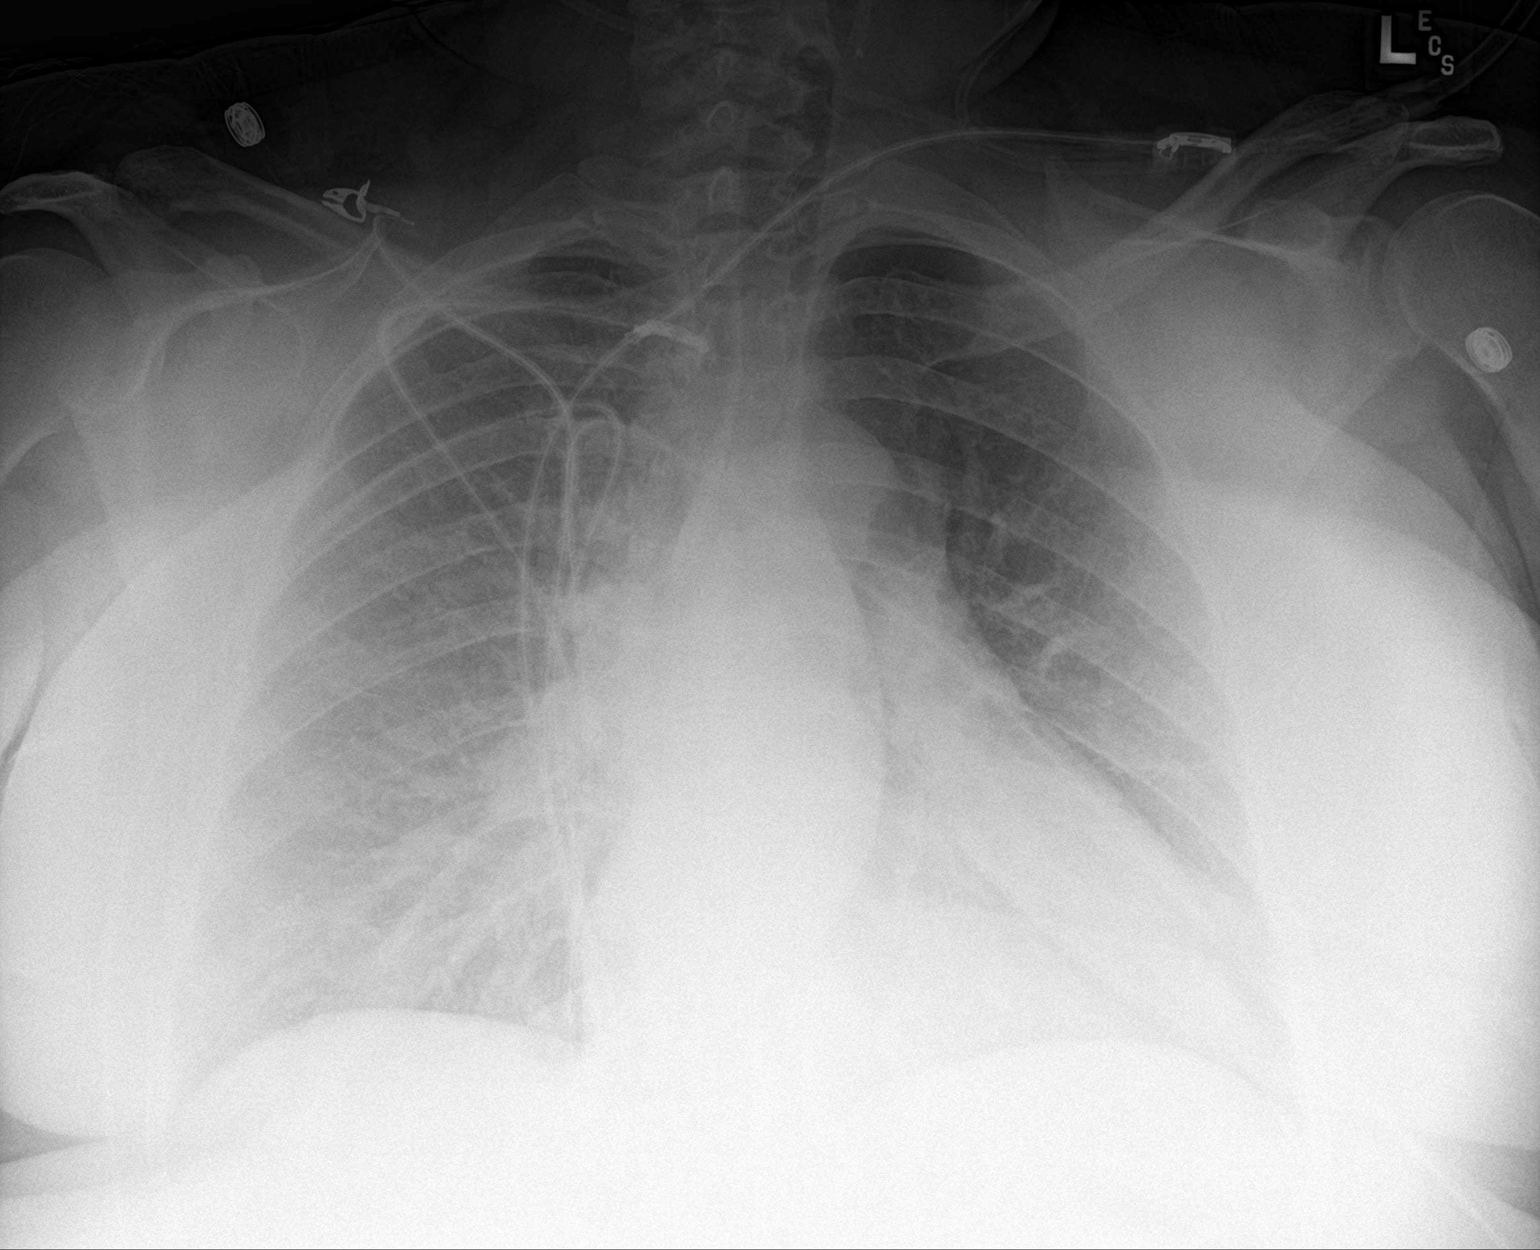

[2 of 2 positions shown; findings below may reference images not displayed]

FINDINGS: Stable cardiomediastinal silhouette. No pneumothorax or pleural
effusion is noted. Both lungs are clear. The visualized skeletal
structures are unremarkable.
IMPRESSION: No active cardiopulmonary disease.

## 2019-03-06 IMAGING — CT CT ANGIO CHEST
2 of 7 series · 18 of 46 positions shown · IV contrast (omnipaque)
Comparison: Prior CT scan of the chest [DATE]

CLINICAL DATA: 48-year-old female with respiratory failure, chest
pain and swelling for the past 4 days. Positive D-dimer, low to
intermediate probability for pulmonary emboli.

EXAM:
CT ANGIOGRAPHY CHEST WITH CONTRAST
TECHNIQUE: Multidetector CT imaging of the chest was performed using the
standard protocol during bolus administration of intravenous
contrast. Multiplanar CT image reconstructions and MIPs were
obtained to evaluate the vascular anatomy.
CONTRAST:  77mL OMNIPAQUE IOHEXOL 350 MG/ML SOLN

[Series 7: thins · axial · 0.74mm/px · z∈[-386,-113]mm · 15 of 440 slices shown]
[im 25/440  lung]
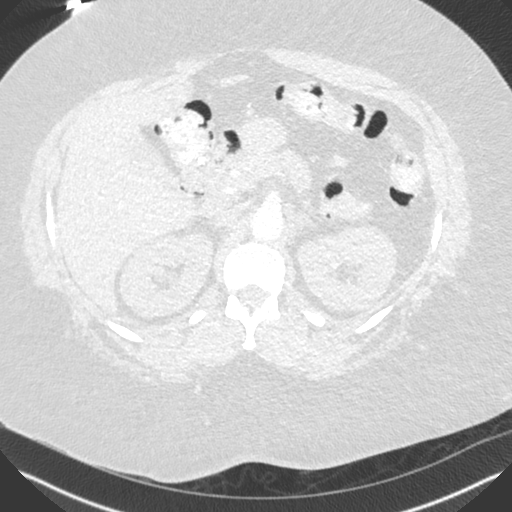
[im 49/440  soft-tissue]
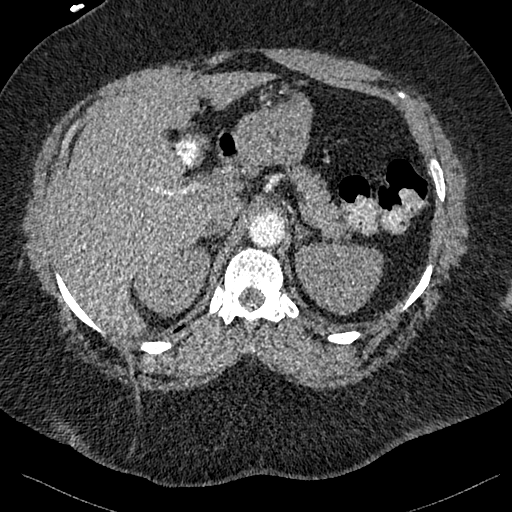
[im 74/440  lung]
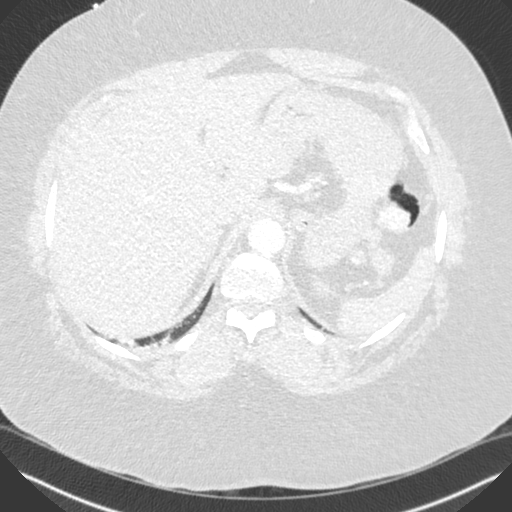
[im 98/440  soft-tissue]
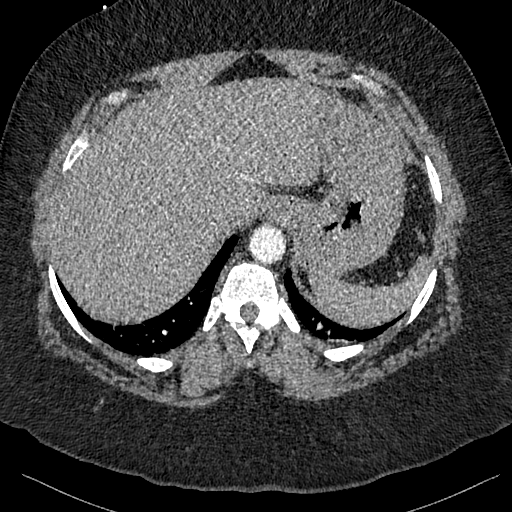
[im 147/440  lung]
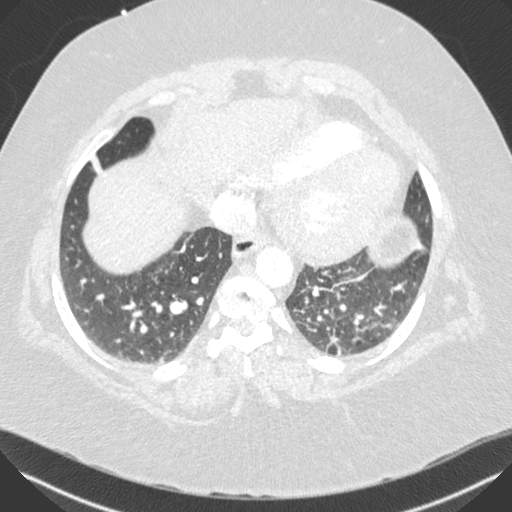
[im 171/440  soft-tissue]
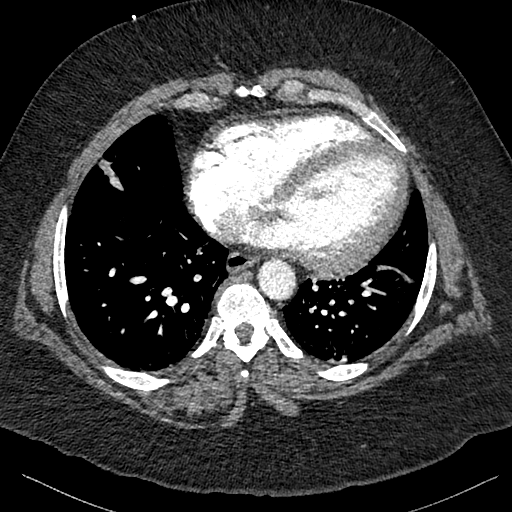
[im 196/440  lung]
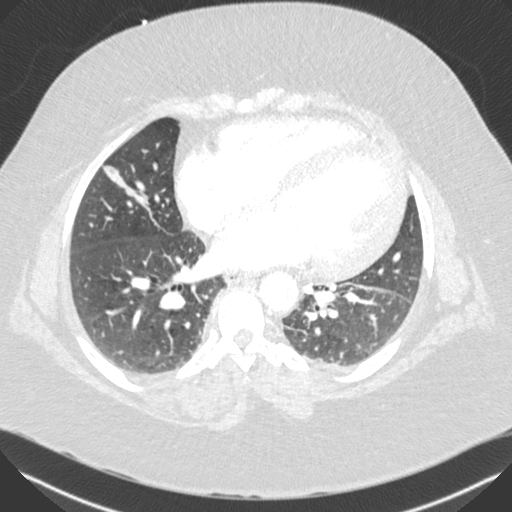
[im 220/440  soft-tissue]
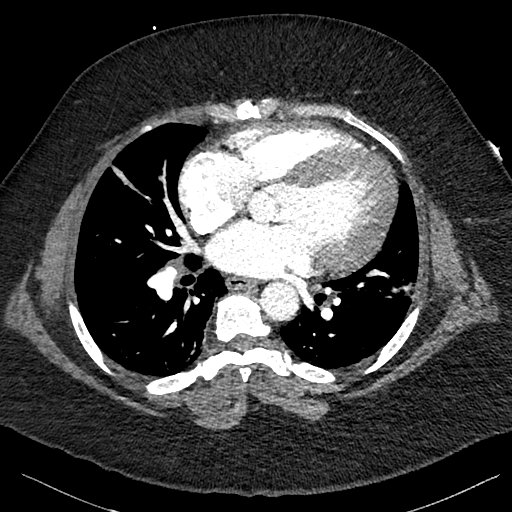
[im 244/440  lung]
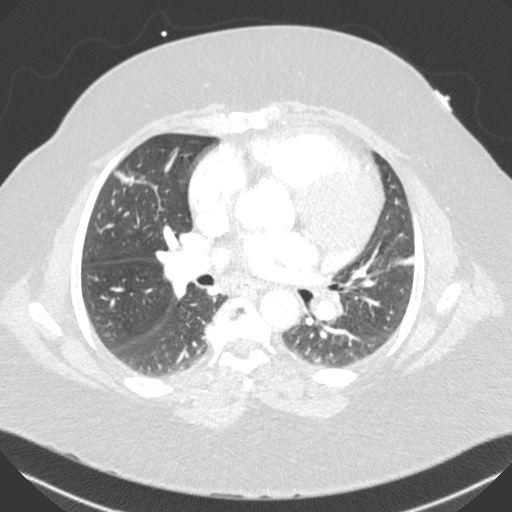
[im 269/440  soft-tissue]
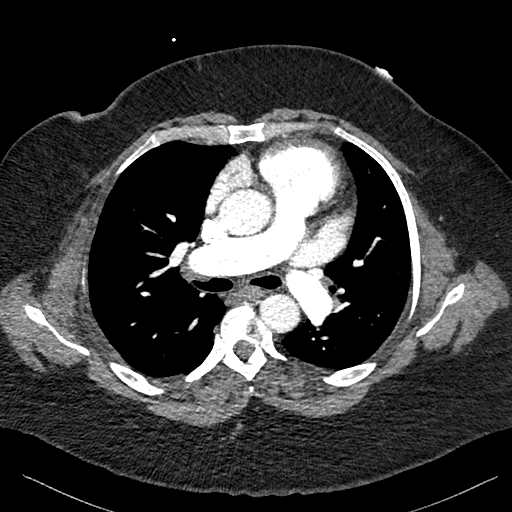
[im 293/440  lung]
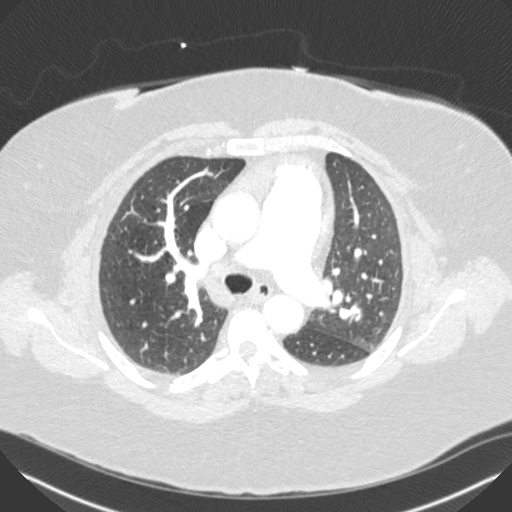
[im 342/440  soft-tissue]
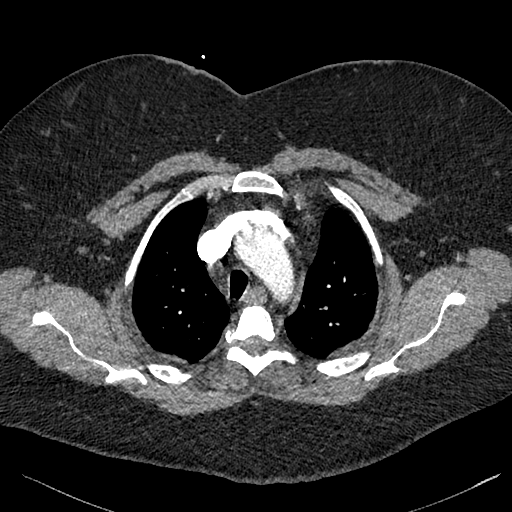
[im 366/440  lung]
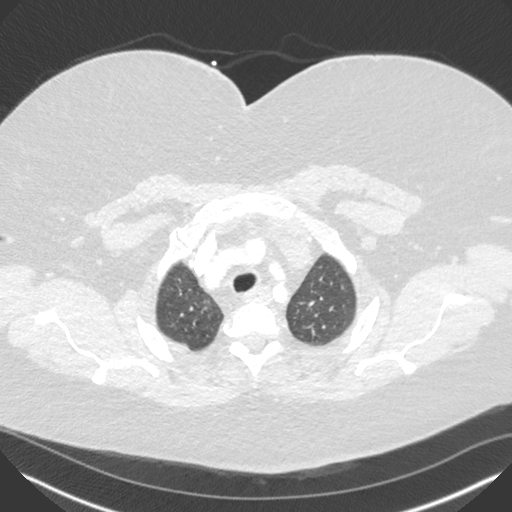
[im 391/440  soft-tissue]
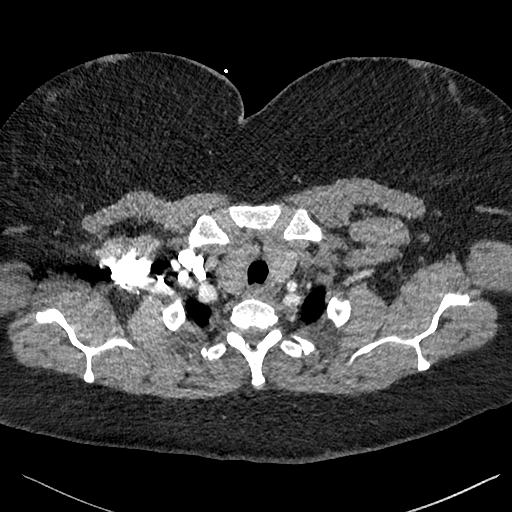
[im 415/440  lung]
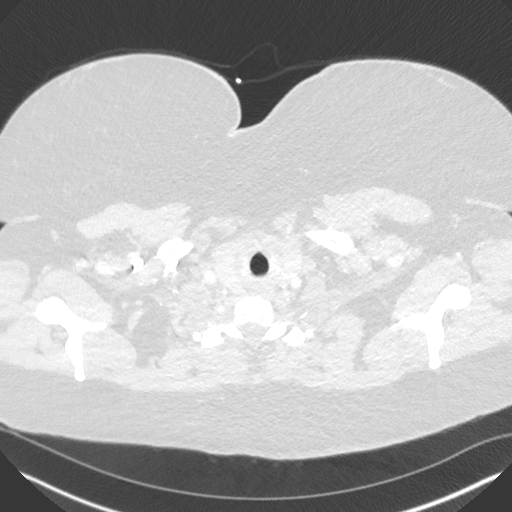

[Series 8: cor · coronal · 0.63mm/px · 3 of 169 slices shown]
[im 43/169  soft-tissue]
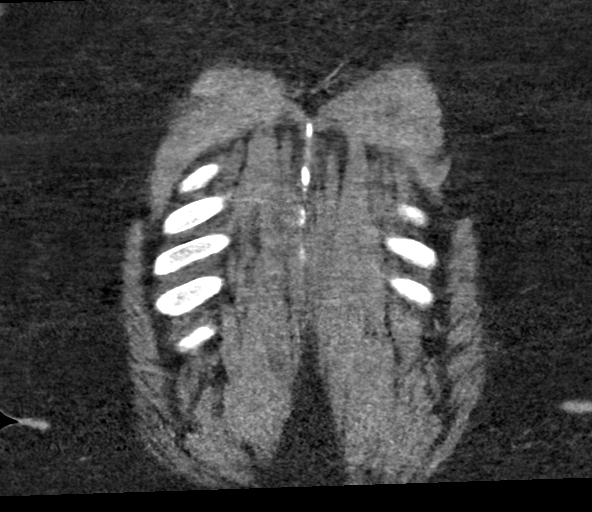
[im 85/169  soft-tissue]
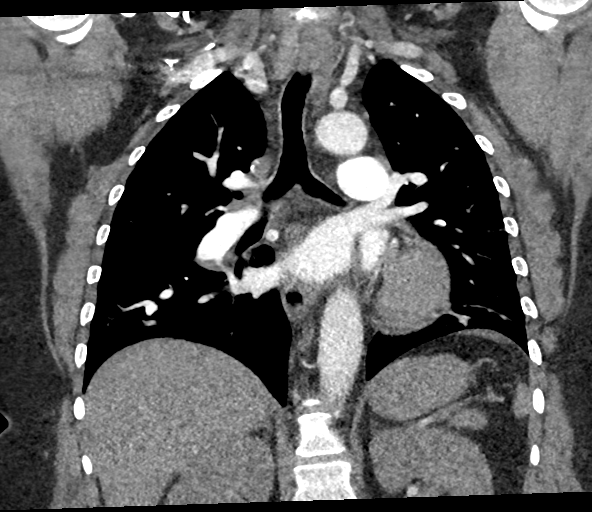
[im 127/169  soft-tissue]
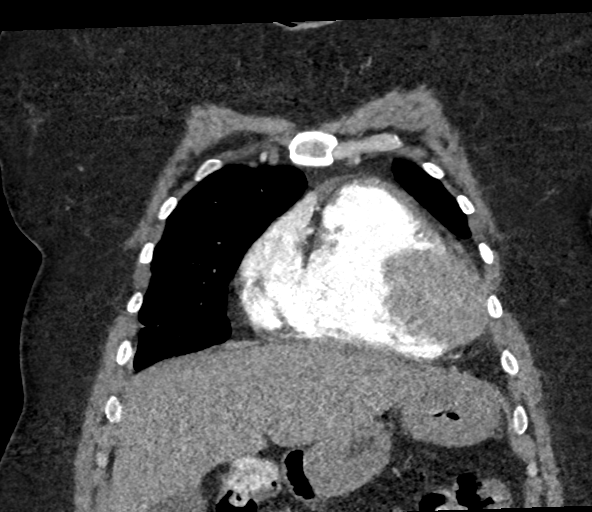

[18 of 46 positions shown; findings below may reference images not displayed]

FINDINGS: Cardiovascular: Excellent opacification of the pulmonary arteries to
the proximal segmental level. No evidence of central filling defect
to suggest acute pulmonary embolus. However, the main pulmonary
artery is markedly enlarged at 4.2 cm indicative of pulmonary
arterial hypertension. The heart is enlarged. The aorta is normal in
caliber. No evidence of aneurysm. No significant atherosclerotic
plaque. Conventional 3 vessel arch anatomy. No pericardial effusion.

Mediastinum/Nodes: Unremarkable CT appearance of the thyroid gland.
No suspicious mediastinal or hilar adenopathy. No soft tissue
mediastinal mass. The thoracic esophagus is unremarkable.

Lungs/Pleura: Mild dependent atelectasis in the lower lobes. Areas
of linear atelectasis versus scarring are also identified in the
inferior right middle lobe and lingula. No suspicious pulmonary mass
or nodule. No evidence of pleural effusion, pulmonary edema or
pneumothorax.

Upper Abdomen: No acute abnormality in the upper abdomen.

Musculoskeletal: Compression deformity present at T3. There is less
than 20% height loss anteriorly. This is a new finding compared to
prior imaging from [DATE].

Review of the MIP images confirms the above findings.
IMPRESSION: 1. Negative for acute pulmonary embolus, pneumonia or other acute
cardiopulmonary process.
2. Enlarged main and central pulmonary arteries consistent with
pulmonary arterial hypertension.
3. Stable cardiomegaly.
4. Scattered areas of dependent atelectasis.
5. Age indeterminate compression fracture of the superior endplate
of T3 is a new finding compared to [DATE].

## 2019-03-06 MED ORDER — ALBUTEROL SULFATE (2.5 MG/3ML) 0.083% IN NEBU
2.5000 mg | INHALATION_SOLUTION | RESPIRATORY_TRACT | Status: DC
  Administered 2019-03-06: 2.5 mg via RESPIRATORY_TRACT
  Filled 2019-03-06: qty 3

## 2019-03-06 MED ORDER — LAMOTRIGINE 25 MG PO TABS
25.0000 mg | ORAL_TABLET | Freq: Two times a day (BID) | ORAL | Status: DC
  Administered 2019-03-06 – 2019-03-08 (×4): 25 mg via ORAL
  Filled 2019-03-06 (×4): qty 1

## 2019-03-06 MED ORDER — ACETAMINOPHEN 325 MG PO TABS
650.0000 mg | ORAL_TABLET | Freq: Four times a day (QID) | ORAL | Status: DC | PRN
  Administered 2019-03-06: 650 mg via ORAL
  Filled 2019-03-06: qty 2

## 2019-03-06 MED ORDER — SENNOSIDES-DOCUSATE SODIUM 8.6-50 MG PO TABS: 1.0000 | ORAL_TABLET | Freq: Every evening | ORAL | Status: DC | PRN

## 2019-03-06 MED ORDER — PREGABALIN 100 MG PO CAPS
300.0000 mg | ORAL_CAPSULE | Freq: Two times a day (BID) | ORAL | Status: DC
  Administered 2019-03-06 – 2019-03-08 (×5): 300 mg via ORAL
  Filled 2019-03-06 (×5): qty 3

## 2019-03-06 MED ORDER — PROMETHAZINE HCL 25 MG PO TABS: 12.5000 mg | ORAL_TABLET | Freq: Four times a day (QID) | ORAL | Status: DC | PRN

## 2019-03-06 MED ORDER — SODIUM CHLORIDE 0.9% FLUSH
3.0000 mL | Freq: Once | INTRAVENOUS | Status: AC
  Administered 2019-03-06: 3 mL via INTRAVENOUS

## 2019-03-06 MED ORDER — ALBUTEROL SULFATE HFA 108 (90 BASE) MCG/ACT IN AERS
2.0000 | INHALATION_SPRAY | Freq: Once | RESPIRATORY_TRACT | Status: AC
  Administered 2019-03-06: 10:00:00 2 via RESPIRATORY_TRACT
  Filled 2019-03-06: qty 6.7

## 2019-03-06 MED ORDER — AMLODIPINE BESYLATE 5 MG PO TABS
5.0000 mg | ORAL_TABLET | Freq: Every day | ORAL | Status: DC
  Administered 2019-03-07 – 2019-03-08 (×2): 5 mg via ORAL
  Filled 2019-03-06 (×2): qty 1

## 2019-03-06 MED ORDER — MOMETASONE FURO-FORMOTEROL FUM 200-5 MCG/ACT IN AERO
2.0000 | INHALATION_SPRAY | Freq: Two times a day (BID) | RESPIRATORY_TRACT | Status: DC
  Administered 2019-03-06 – 2019-03-08 (×4): 2 via RESPIRATORY_TRACT
  Filled 2019-03-06: qty 8.8

## 2019-03-06 MED ORDER — VITAMIN D 25 MCG (1000 UNIT) PO TABS
1000.0000 [IU] | ORAL_TABLET | Freq: Every day | ORAL | Status: DC
  Administered 2019-03-06 – 2019-03-08 (×3): 1000 [IU] via ORAL
  Filled 2019-03-06 (×3): qty 1

## 2019-03-06 MED ORDER — FUROSEMIDE 10 MG/ML IJ SOLN
40.0000 mg | Freq: Once | INTRAMUSCULAR | Status: AC
  Administered 2019-03-06: 40 mg via INTRAVENOUS
  Filled 2019-03-06: qty 4

## 2019-03-06 MED ORDER — IOHEXOL 350 MG/ML SOLN
100.0000 mL | Freq: Once | INTRAVENOUS | Status: AC | PRN
  Administered 2019-03-06: 12:00:00 77 mL via INTRAVENOUS

## 2019-03-06 MED ORDER — ENOXAPARIN SODIUM 80 MG/0.8ML ~~LOC~~ SOLN
80.0000 mg | SUBCUTANEOUS | Status: DC
  Administered 2019-03-06 – 2019-03-07 (×2): 80 mg via SUBCUTANEOUS
  Filled 2019-03-06 (×2): qty 0.8

## 2019-03-06 MED ORDER — IBUPROFEN 600 MG PO TABS
600.0000 mg | ORAL_TABLET | Freq: Four times a day (QID) | ORAL | Status: AC | PRN
  Administered 2019-03-07 (×2): 600 mg via ORAL
  Filled 2019-03-06 (×3): qty 1

## 2019-03-06 MED ORDER — HYDROXYZINE HCL 25 MG PO TABS
50.0000 mg | ORAL_TABLET | Freq: Once | ORAL | Status: AC
  Administered 2019-03-06: 50 mg via ORAL
  Filled 2019-03-06 (×2): qty 2

## 2019-03-06 MED ORDER — ACETAMINOPHEN 650 MG RE SUPP: 650.0000 mg | Freq: Four times a day (QID) | RECTAL | Status: DC | PRN

## 2019-03-06 MED ORDER — PANTOPRAZOLE SODIUM 40 MG PO TBEC
80.0000 mg | DELAYED_RELEASE_TABLET | Freq: Every day | ORAL | Status: DC
  Administered 2019-03-06 – 2019-03-08 (×3): 80 mg via ORAL
  Filled 2019-03-06 (×3): qty 2

## 2019-03-06 MED ORDER — OXYCODONE HCL 5 MG PO TABS
5.0000 mg | ORAL_TABLET | Freq: Four times a day (QID) | ORAL | Status: AC | PRN
  Administered 2019-03-06 – 2019-03-07 (×2): 5 mg via ORAL
  Filled 2019-03-06 (×2): qty 1

## 2019-03-06 MED ORDER — ASPIRIN 81 MG PO CHEW
324.0000 mg | CHEWABLE_TABLET | Freq: Once | ORAL | Status: AC
  Administered 2019-03-06: 324 mg via ORAL
  Filled 2019-03-06: qty 4

## 2019-03-06 MED ORDER — ALBUTEROL SULFATE HFA 108 (90 BASE) MCG/ACT IN AERS: 2.0000 | INHALATION_SPRAY | Freq: Four times a day (QID) | RESPIRATORY_TRACT | Status: DC | PRN

## 2019-03-06 NOTE — ED Notes (Signed)
Attempted report x 2 

## 2019-03-06 NOTE — H&P (Signed)
Date: 03/06/2019               Patient Name:  Lindsay [name] MRN: [id number]  DOB: November 07, 1970 Age / Sex: 49 y.o., female   PCP: Velna Ochs, MD         Medical Service: Internal Medicine Teaching Service         Attending Physician: Dr. Sid Falcon, MD    First Contact: Dr. Benjamine Mola Pager: [phone]  Second Contact: Dr. Maricela Bo Pager: [phone]       After Hours (After 5p/  First Contact Pager: [phone]  weekends / holidays): Second Contact Pager: [phone]   Chief Complaint: Shortness of breath  History of Present Illness:  Patient is a 49 year old female with PMH significant for COPD on 4L at home, HTN, and depression who presented with a 7 day history of shortness of breath.  Patient reports that prior to this she had some congestion and felt sick.  Patient reports progressive worsening shortness of breath along with 4 pillow orthopnea and PND every night. Patient also reports sweats at night, denies fever. Patient states she has a sharp, right-sided, intermittent chest pain that radiates to the left, which started 7 days ago, is worse with deep breathing and when lying on left side. Patient also reports that over the past week she has had leg swelling, weight gain, and abdominal pain with bloating. Patient reports that she can ambulate by herself at home but uses a rollator when going outside. She states that she gets short of breath when walking short distances.  Meds:  Current Facility-Administered Medications for the 03/06/19 encounter [facility] Encounter)  Medication  . ferumoxytol [facility]) injection 510 mg   Current Meds  Medication Sig  . acetaminophen (TYLENOL) 650 MG CR tablet Take 1,300 mg by mouth every 8 (eight) hours as needed for pain.  [name] albuterol (PROVENTIL) (2.5 MG/3ML) 0.083% nebulizer solution Take 3 mLs (2.5 mg total) by nebulization every 4 (four) hours.  [name] albuterol (VENTOLIN HFA) 108 (90 Base) MCG/ACT inhaler TAKE 2 PUFFS BY MOUTH EVERY 6 HOURS AS  NEEDED FOR WHEEZE OR SHORTNESS OF BREATH (Patient taking differently: Inhale 2 puffs into the lungs every 6 (six) hours as needed for wheezing or shortness of breath. )  . amLODipine (NORVASC) 10 MG tablet TAKE 1/2 TABLET BY MOUTH EVERY DAY (Patient taking differently: Take 5 mg by mouth daily. )  . Ascorbic Acid (VITAMIN C PO) Take 1 tablet by mouth daily.  . Aspirin-Salicylamide-Caffeine (BC HEADACHE POWDER PO) Take 2 Packages by mouth as needed (headache).  [name] BIOTIN PO Take 1 tablet by mouth daily.  . Brexpiprazole (REXULTI) 3 MG TABS Take 1 tablet (3 mg total) by mouth daily.  Hendricks Limes CONTINUING MONTH PAK 1 MG tablet TAKE 1 TABLET BY MOUTH TWICE A DAY (Patient taking differently: Take 1 mg by mouth 2 (two) times daily. )  . cholecalciferol (VITAMIN D) 1000 units tablet Take 1 tablet (1,000 Units total) by mouth daily.  . clorazepate (TRANXENE) 7.5 MG tablet Take 1 tablet (7.5 mg total) by mouth daily.  . hydrochlorothiazide (HYDRODIURIL) 25 MG tablet TAKE 1 TABLET BY MOUTH EVERY DAY (Patient taking differently: Take 25 mg by mouth daily. )  . hydrOXYzine (VISTARIL) 50 MG capsule Take 1 capsule (50 mg total) by mouth at bedtime as needed for anxiety.  . lamoTRIgine (LAMICTAL) 25 MG tablet Take one tab daily for 2 one week and than two tab daily (Patient taking differently: Take  25 mg by mouth 2 (two) times daily. )  . omeprazole (PRILOSEC) 40 MG capsule Take 1 capsule (40 mg total) by mouth daily.  . OXYGEN Inhale 4 L into the lungs.  . pregabalin (LYRICA) 300 MG capsule Take 1 capsule (300 mg total) by mouth 2 (two) times daily.  . Probiotic Product (PROBIOTIC PO) Take 1 capsule by mouth daily.  . promethazine (PHENERGAN) 12.5 MG tablet TAKE 1 TABLET (12.5 MG TOTAL) BY MOUTH EVERY 6 (SIX) HOURS AS NEEDED FOR NAUSEA OR VOMITING.  . SYMBICORT 160-4.5 MCG/ACT inhaler TAKE 2 PUFFS BY MOUTH TWICE A DAY (Patient taking differently: Inhale 2 puffs into the lungs 2 (two) times daily. )  . valsartan  (DIOVAN) 160 MG tablet Take 1 tablet (160 mg total) by mouth daily.  . vitamin B-12 (CYANOCOBALAMIN) 1000 MCG tablet Take 1 tablet (1,000 mcg total) by mouth daily.  [name] VITAMIN E PO Take 1 capsule by mouth daily.    Allergies: Allergies as of 03/06/2019 - Review Complete 03/06/2019  Allergen Reaction Noted  . Lisinopril Rash and Cough 11/06/2015   Past Medical History:  Diagnosis Date  . Acid reflux   . Anemia    Iron Def  . Anorexia   . Chronic kidney disease    Nephrotic syndrome  . Colon polyp 2009  . Depression with anxiety   . Edema leg   . Fibromyalgia   . Hemorrhoids   . Hidradenitis suppurativa   . Hypertension   . IBS (irritable bowel syndrome)   . Low back pain   . Migraines   . Morbidly obese ([place])   . Neuromuscular disorder (HCC)    fibromyalgia  . Neuropathy   . Panic attacks   . Polyp of vocal cord or larynx   . Tonsil pain     Family History:  * Denies family history of lung disease  Social History:  * Patient denies alcohol or other substance usage. Patient states she used to spoke 1 ppd for 21 years but now takes chantix * Patient states she lives in [place] with mother and daughter. Patient has a nurse aide that comes in every day for 2 hours and helps with ADLs  Review of Systems: A complete ROS was negative except as per HPI.   Physical Exam: Blood pressure (!) 160/105, pulse 99, temperature 98.6 F (37 C), resp. rate 18, height 5\' 11"  (1.803 m), weight (!) 155.1 kg, last menstrual period 02/26/2019, SpO2 99 %. Physical Exam  Constitutional: She is well-developed, well-nourished, and in no distress.  HENT:  Head: Normocephalic and atraumatic.  Eyes: EOM are normal. Right eye exhibits no discharge. Left eye exhibits no discharge.  Neck: No tracheal deviation present.  Cardiovascular: Normal rate and regular rhythm. Exam reveals no gallop and no friction rub.  No murmur heard. Pulmonary/Chest: Effort normal and breath sounds normal. No  respiratory distress. She has no wheezes. She has no rales.  Abdominal: Soft. She exhibits no distension. There is abdominal tenderness (Left-sided abdmoinal pain to moderate palpation). There is no rebound and no guarding.  Musculoskeletal:        General: No tenderness, deformity or edema. Normal range of motion.     Cervical back: Normal range of motion.     Comments: Pain when pressing on sternum  Neurological: She is alert. Coordination normal.  Skin: Skin is warm and dry. No rash noted. She is not diaphoretic. No erythema.  Psychiatric: Memory and judgment normal.   EKG: personally reviewed my  interpretation is sinus tachycardia  CXR: personally reviewed my interpretation is no acute cardiopulmonary process  Assessment & Plan by Problem: Active Problems:   Acute respiratory failure [facility])  Patient is a 49 year old female with PMH significant for COPD on 4L at home, HTN, and depression who presented with a 7 day history of shortness of breath.   # Acute hypoxic respiratory failure # COPD Patient with increased oxygen requirement from baseline, pleuritic chest pain, CT angiogram on admission was negative for PE or other acute cardiopulmonary process.  CT angiogram does demonstrate enlarged main and central pulmonary arteries consistent with pulmonary arterial hypertension. PFTs from January 2020 show FEV1/FVC ratio of 78, FEV1 %Pred of 55. Patient is without wheezing on exam or increased cough. Patient articulates symptoms suggestive of heart failure but patient is without history of heart failure and physical exam is not consistent with volume-overload. Patient was given lasix 40 mg IV in the ER. The signs of pulmonary hypertension could be secondary to chronic lung disease vs. left-sided heart failure. * Will get TTE  * Albuterol nebulizer Q4HRs * Dulera 2 puff twice daily * PT/OT evaluation * Continuous pulse ox, telemetry * Strict I/Os, daily weights  # Hypertension: Amlodipine 5  mg daily  # GERD: Pantoprazole 80 mg daily   Diet: Heart healthy DVT Ppx: Lovenox 80 mg daily Dispo: Admit patient to Inpatient with expected length of stay greater than 2 midnights.  Signed: Jeanmarie Hubert, MD 03/06/2019, 5:13 PM  Pager: [phone]

## 2019-03-06 NOTE — ED Provider Notes (Signed)
[facility] EMERGENCY DEPARTMENT Provider Note   CSN: [id number] Arrival date & time: 03/06/19  [id number]     History Chief Complaint  Patient presents with  . Chest Pain  . Leg Swelling    Lindsay [name] is a 49 y.o. female.  HPI   49 year old female history of GERD, anorexia, CKD, depression/anxiety, hidradenitis suppurativa, hypertension, migraines, morbidly obese, neuromuscular disorder, and COPD (on chronic 4L O2) who presents the emergency department today for evaluation of shortness of breath, chest pain and leg swelling.  She states that about a week ago she noticed swelling to her legs.  She also noticed that she has been more short of breath especially when laying flat.  She also complains of right-sided chest pain that feels sharp and aching.  It radiates to the left side of her chest.  She rates it an 8/10.  It has been intermittent.  Pain is exacerbated by lifting the right upper extremity and is also exacerbated with exertion.  She reports a chronic history of nausea which is unchanged and she has had some intermittent episodes of vomiting.  She reports some diaphoresis and states the chest pain is somewhat pleuritic in nature.  She has not had any increased cough.  Denies lhemoptysis, recent surgery/trauma, recent long travel, hormone use, personal hx of cancer, or hx of DVT/PE.    Past Medical History:  Diagnosis Date  . Acid reflux   . Anemia    Iron Def  . Anorexia   . Chronic kidney disease    Nephrotic syndrome  . Colon polyp 2009  . Depression with anxiety   . Edema leg   . Fibromyalgia   . Hemorrhoids   . Hidradenitis suppurativa   . Hypertension   . IBS (irritable bowel syndrome)   . Low back pain   . Migraines   . Morbidly obese ([place])   . Neuromuscular disorder (HCC)    fibromyalgia  . Neuropathy   . Panic attacks   . Polyp of vocal cord or larynx   . Tonsil pain     Patient Active Problem List   Diagnosis Date Noted  . URI  (upper respiratory infection) 02/24/2019  . Iron deficiency 02/10/2019  . History of colon polyps   . Vision blurring 09/17/2018  . Fall 06/30/2018  . Frequent urination at night 04/23/2018  . Chronic cough 03/04/2018  . Dental infection 12/05/2017  . Right arm pain 12/05/2017  . Chronic respiratory failure with hypoxia ([place])   . Low serum vitamin B12 05/02/2017  . Palpitations 04/11/2017  . Nutritional anemia 06/17/2016  . Allergic rhinitis 06/12/2016  . Vitamin D deficiency, unspecified 06/12/2016  . Nicotine dependence 06/12/2016  . Menorrhagia 01/08/2016  . Migraines 01/08/2016  . Bilateral leg pain 11/29/2015  . Hemorrhoids 07/05/2015  . Fibromyalgia 07/05/2015  . Chronic leg pain 09/26/2014  . Healthcare maintenance 09/26/2014  . Morbid obesity ([place]) 07/04/2014  . Major depressive disorder, recurrent episode, moderate ([place]) 10/13/2012  . Generalized anxiety disorder 10/13/2012  . Vaginal yeast infection 11/27/2010  . Chronic nausea 05/10/2010  . IBS (irritable bowel syndrome) 06/16/2007  . Essential hypertension 02/06/2006  . HIDRADENITIS 11/18/2005    Past Surgical History:  Procedure Laterality Date  . AXILLARY HIDRADENITIS EXCISION    . COLONOSCOPY WITH PROPOFOL N/A 05/25/2015   Procedure: COLONOSCOPY WITH PROPOFOL;  Surgeon: Milus Banister, MD;  Location: [facility] END[facility]COPY;  Service: Endoscopy;  Laterality: N/A;  . COLONOSCOPY WITH PROPOFOL N/A 12/31/2018   Procedure:  COLONOSCOPY WITH PROPOFOL;  Surgeon: Milus Banister, MD;  Location: [facility];  Service: Endoscopy;  Laterality: N/A;  . HEMORRHOID SURGERY     with Hidradenitis surgery   . INGUINAL HIDRADENITIS EXCISION    . TONSILLECTOMY  10/18/2010   by Dr. Wilburn Cornelia  . UPPER GASTROINTESTINAL ENDOSCOPY       OB History    Gravida  2   Para  1   Term      Preterm      AB  1   Living        SAB      TAB  1   Ectopic      Multiple      Live Births              Family History    Problem Relation Age of Onset  . Diabetes Mother   . Heart disease Mother        valve leak  . Anxiety disorder Mother   . Depression Mother   . High blood pressure Mother   . Kidney failure Mother   . Cancer Father        prostate  . Heart disease Father   . Learning disabilities Sister   . Depression Sister   . Depression Sister   . Anxiety disorder Sister   . Colon cancer Neg Hx     Social History   Tobacco Use  . Smoking status: Current Every Day Smoker    Packs/day: 0.10    Years: 25.00    Pack years: 2.50    Types: Cigarettes  . Smokeless tobacco: Never Used  . Tobacco comment: DOWN TO 1 A DAY  Substance Use Topics  . Alcohol use: Not Currently    Alcohol/week: 0.0 standard drinks    Comment: none sine 05/2018  . Drug use: Not Currently    Frequency: 3.0 times per week    Types: Marijuana    Home Medications Prior to Admission medications   Medication Sig Start Date End Date Taking? Authorizing Provider  acetaminophen (TYLENOL) 650 MG CR tablet Take 1,300 mg by mouth every 8 (eight) hours as needed for pain.   Yes [provider]  albuterol (PROVENTIL) (2.5 MG/3ML) 0.083% nebulizer solution Take 3 mLs (2.5 mg total) by nebulization every 4 (four) hours. 08/12/18  Yes Olalere, Adewale A, MD  albuterol (VENTOLIN HFA) 108 (90 Base) MCG/ACT inhaler TAKE 2 PUFFS BY MOUTH EVERY 6 HOURS AS NEEDED FOR WHEEZE OR SHORTNESS OF BREATH Patient taking differently: Inhale 2 puffs into the lungs every 6 (six) hours as needed for wheezing or shortness of breath.  02/22/19  Yes Olalere, Adewale A, MD  amLODipine (NORVASC) 10 MG tablet TAKE 1/2 TABLET BY MOUTH EVERY DAY Patient taking differently: Take 5 mg by mouth daily.  02/16/19  Yes Velna Ochs, MD  Ascorbic Acid (VITAMIN C PO) Take 1 tablet by mouth daily.   Yes [provider]  Aspirin-Salicylamide-Caffeine (BC HEADACHE POWDER PO) Take 2 Packages by mouth as needed (headache).   Yes [provider]  BIOTIN PO Take 1 tablet by mouth daily.   Yes [provider]  Brexpiprazole (REXULTI) 3 MG TABS Take 1 tablet (3 mg total) by mouth daily. 02/04/19  Yes [name], Arlyce Harman, MD  CHANTIX CONTINUING MONTH PAK 1 MG tablet TAKE 1 TABLET BY MOUTH TWICE A DAY Patient taking differently: Take 1 mg by mouth 2 (two) times daily.  02/03/19  Yes Olalere, Adewale A,  MD  cholecalciferol (VITAMIN D) 1000 units tablet Take 1 tablet (1,000 Units total) by mouth daily. 05/06/17  Yes Velna Ochs, MD  clorazepate (TRANXENE) 7.5 MG tablet Take 1 tablet (7.5 mg total) by mouth daily. 02/04/19  Yes [name], Arlyce Harman, MD  hydrochlorothiazide (HYDRODIURIL) 25 MG tablet TAKE 1 TABLET BY MOUTH EVERY DAY Patient taking differently: Take 25 mg by mouth daily.  11/27/18  Yes Velna Ochs, MD  hydrOXYzine (VISTARIL) 50 MG capsule Take 1 capsule (50 mg total) by mouth at bedtime as needed for anxiety. 02/04/19  Yes [name], Arlyce Harman, MD  lamoTRIgine (LAMICTAL) 25 MG tablet Take one tab daily for 2 one week and than two tab daily Patient taking differently: Take 25 mg by mouth 2 (two) times daily.  02/04/19  Yes [name], Arlyce Harman, MD  omeprazole (PRILOSEC) 40 MG capsule Take 1 capsule (40 mg total) by mouth daily. 06/29/18  Yes Sid Falcon, MD  OXYGEN Inhale 4 L into the lungs.   Yes [provider]  pregabalin (LYRICA) 300 MG capsule Take 1 capsule (300 mg total) by mouth 2 (two) times daily. 02/10/19  Yes Velna Ochs, MD  Probiotic Product (PROBIOTIC PO) Take 1 capsule by mouth daily.   Yes [provider]  promethazine (PHENERGAN) 12.5 MG tablet TAKE 1 TABLET (12.5 MG TOTAL) BY MOUTH EVERY 6 (SIX) HOURS AS NEEDED FOR NAUSEA OR VOMITING. 02/08/19  Yes Milus Banister, MD  SYMBICORT 160-4.5 MCG/ACT inhaler TAKE 2 PUFFS BY MOUTH TWICE A DAY Patient taking differently: Inhale 2 puffs into the lungs 2 (two) times daily.  12/30/18  Yes Olalere, Adewale A, MD  valsartan (DIOVAN) 160 MG tablet Take  1 tablet (160 mg total) by mouth daily. 07/08/18  Yes Velna Ochs, MD  vitamin B-12 (CYANOCOBALAMIN) 1000 MCG tablet Take 1 tablet (1,000 mcg total) by mouth daily. 05/06/17  Yes Velna Ochs, MD  VITAMIN E PO Take 1 capsule by mouth daily.   Yes [provider]  Capsaicin 0.075 % GEL Apply 1 application topically 4 (four) times daily as needed. Patient not taking: Reported on 12/23/2018 08/20/18   Molli Hazard A, DO  escitalopram (LEXAPRO) 10 MG tablet Take 1/2 tab daily for one week and than full tab daily Patient not taking: Reported on 03/06/2019 02/24/19   [name], Arlyce Harman, MD  HYDROcodone-acetaminophen (NORCO) 5-325 MG tablet Take 1 tablet by mouth every 12 (twelve) hours as needed for moderate pain. Patient not taking: Reported on 03/06/2019 01/11/19   Kathi Ludwig, MD  iron polysaccharides (NU-IRON) 150 MG capsule Take 1 capsule (150 mg total) by mouth daily. Patient not taking: Reported on 03/06/2019 08/20/18   Seawell, Andris Baumann A, DO  naproxen (NAPROSYN) 500 MG tablet Take 1 tablet (500 mg total) by mouth 2 (two) times daily as needed. Patient not taking: Reported on 03/06/2019 01/18/19   Velna Ochs, MD    Allergies    Lisinopril  Review of Systems   Review of Systems  Constitutional: Negative for fever.  HENT: Negative for ear pain and sore throat.   Eyes: Negative for visual disturbance.  Respiratory: Positive for shortness of breath. Negative for cough.   Cardiovascular: Positive for chest pain and leg swelling. Negative for palpitations.  Gastrointestinal: Positive for nausea and vomiting. Negative for abdominal pain, constipation and diarrhea.  Genitourinary: Negative for dysuria and hematuria.  Musculoskeletal: Negative for back pain.  Skin: Negative for color change and rash.  Neurological: Negative for headaches.  All other systems reviewed  and are negative.   Physical Exam Updated Vital Signs BP (!) 147/100   Pulse (!) 103   Temp 98.5 F  (36.9 C) (Oral)   Resp 16   Ht 5\' 11"  (1.803 m)   Wt (!) 165.6 kg   LMP 02/26/2019   SpO2 98%   BMI 50.91 kg/m   Physical Exam Vitals and nursing note reviewed.  Constitutional:      General: She is not in acute distress.    Appearance: She is well-developed.  HENT:     Head: Normocephalic and atraumatic.  Eyes:     Conjunctiva/sclera: Conjunctivae normal.  Cardiovascular:     Rate and Rhythm: Normal rate and regular rhythm.     Heart sounds: No murmur.  Pulmonary:     Effort: Pulmonary effort is normal. Tachypnea present. No respiratory distress.     Breath sounds: Examination of the right-middle field reveals decreased breath sounds. Examination of the left-middle field reveals decreased breath sounds. Examination of the right-lower field reveals decreased breath sounds. Examination of the left-lower field reveals decreased breath sounds. Decreased breath sounds present.  Abdominal:     Palpations: Abdomen is soft.     Tenderness: There is no abdominal tenderness.  Musculoskeletal:     Cervical back: Neck supple.     Comments: 2+ pitting edema to the BLE, no calf TTP  Skin:    General: Skin is warm and dry.  Neurological:     Mental Status: She is alert.     ED Results / Procedures / Treatments   Labs (all labs ordered are listed, but only abnormal results are displayed) Labs Reviewed  BASIC METABOLIC PANEL - Abnormal; Notable for the following components:      Result Value   BUN <5 (*)    All other components within normal limits  CBC - Abnormal; Notable for the following components:   RBC 5.64 (*)    HCT 48.4 (*)    RDW 17.8 (*)    Platelets 500 (*)    nRBC 0.3 (*)    All other components within normal limits  BRAIN NATRIURETIC PEPTIDE - Abnormal; Notable for the following components:   B Natriuretic Peptide 332.8 (*)    All other components within normal limits  D-DIMER, QUANTITATIVE (NOT AT [facility]) - Abnormal; Notable for the following components:    D-Dimer, Quant 0.92 (*)    All other components within normal limits  RESPIRATORY PANEL BY RT PCR (FLU A&B, COVID)  HEPATIC FUNCTION PANEL  I-STAT BETA HCG BLOOD, ED ([facility], [facility], [facility] ONLY)  TROPONIN I (HIGH SENSITIVITY)  TROPONIN I (HIGH SENSITIVITY)    EKG EKG Interpretation  Date/Time:  Saturday March 06 2019 08:46:11 EST Ventricular Rate:  110 PR Interval:  142 QRS Duration: 86 QT Interval:  360 QTC Calculation: 487 R Axis:   38 Text Interpretation: Sinus tachycardia with frequent Premature ventricular complexes Right atrial enlargement Minimal voltage criteria for LVH, may be normal variant ( Cornell product ) Septal infarct , age undetermined Abnormal ECG Confirmed by Quintella Reichert [phone]) on 03/06/2019 10:56:57 AM   Radiology DG Chest 2 View  Result Date: 03/06/2019 CLINICAL DATA:  Chest pain. EXAM: CHEST - 2 VIEW COMPARISON:  Jun 24, 2018. FINDINGS: Stable cardiomediastinal silhouette. No pneumothorax or pleural effusion is noted. Both lungs are clear. The visualized skeletal structures are unremarkable. IMPRESSION: No active cardiopulmonary disease. Electronically Signed   By: Marijo Conception M.D.   On: 03/06/2019 10:34   CT Angio  Chest PE W and/or Wo Contrast  Result Date: 03/06/2019 CLINICAL DATA:  49 year old female with respiratory failure, chest pain and swelling for the past 4 days. Positive D-dimer, low to intermediate probability for pulmonary emboli. EXAM: CT ANGIOGRAPHY CHEST WITH CONTRAST TECHNIQUE: Multidetector CT imaging of the chest was performed using the standard protocol during bolus administration of intravenous contrast. Multiplanar CT image reconstructions and MIPs were obtained to evaluate the vascular anatomy. CONTRAST:  85mL OMNIPAQUE IOHEXOL 350 MG/ML SOLN COMPARISON:  Prior CT scan of the chest 12/04/2017 FINDINGS: Cardiovascular: Excellent opacification of the pulmonary arteries to the proximal segmental level. No evidence of central filling defect to  suggest acute pulmonary embolus. However, the main pulmonary artery is markedly enlarged at 4.2 cm indicative of pulmonary arterial hypertension. The heart is enlarged. The aorta is normal in caliber. No evidence of aneurysm. No significant atherosclerotic plaque. Conventional 3 vessel arch anatomy. No pericardial effusion. Mediastinum/Nodes: Unremarkable CT appearance of the thyroid gland. No suspicious mediastinal or hilar adenopathy. No soft tissue mediastinal mass. The thoracic esophagus is unremarkable. Lungs/Pleura: Mild dependent atelectasis in the lower lobes. Areas of linear atelectasis versus scarring are also identified in the inferior right middle lobe and lingula. No suspicious pulmonary mass or nodule. No evidence of pleural effusion, pulmonary edema or pneumothorax. Upper Abdomen: No acute abnormality in the upper abdomen. Musculoskeletal: Compression deformity present at T3. There is less than 20% height loss anteriorly. This is a new finding compared to prior imaging from November 2019. Review of the MIP images confirms the above findings. IMPRESSION: 1. Negative for acute pulmonary embolus, pneumonia or other acute cardiopulmonary process. 2. Enlarged main and central pulmonary arteries consistent with pulmonary arterial hypertension. 3. Stable cardiomegaly. 4. Scattered areas of dependent atelectasis. 5. Age indeterminate compression fracture of the superior endplate of T3 is a new finding compared to November of 2019. Electronically Signed   By: Jacqulynn Cadet M.D.   On: 03/06/2019 12:35    Procedures Procedures (including critical care time)  Medications Ordered in ED Medications  sodium chloride flush (NS) 0.9 % injection 3 mL (3 mLs Intravenous Given 03/06/19 0916)  aspirin chewable tablet 324 mg (324 mg Oral Given 03/06/19 1023)  albuterol (VENTOLIN HFA) 108 (90 Base) MCG/ACT inhaler 2 puff (2 puffs Inhalation Given 03/06/19 1023)  furosemide (LASIX) injection 40 mg (40 mg Intravenous  Given 03/06/19 1117)  iohexol (OMNIPAQUE) 350 MG/ML injection 100 mL (77 mLs Intravenous Contrast Given 03/06/19 1203)    ED Course  I have reviewed the triage vital signs and the nursing notes.  Pertinent labs & imaging results that were available during my care of the patient were reviewed by me and considered in my medical decision making (see chart for details).    MDM Rules/Calculators/A&P                      49 year old female with a history of COPD, respiratory failure, who presents emergency department today for evaluation of shortness of breath.  She is also had bilateral lower extremity swelling and chest pain for the last week.  On arrival patient somewhat hypertensive, somewhat tachypneic.  She is on her home 4 L of O2.  The remainder of her vital signs are within normal limits.  She does appear to be fluid overloaded with peripheral edema.  Abdomen soft and nontender.  She has decreased breath sounds throughout.  Heart with regular rate and rhythm.  cbc nonacute Bmp nonacute Liver enzymes wnl Beta hcg  neg Trop neg x2 bnp elevated  ddimer elevated  - will get cta  EKG with sinus tachycardia with frequent Premature ventricular complexes Right atrial enlargement Minimal voltage criteria for LVH, may be normal variant ( Cornell product ) Septal infarct , age undetermined Abnormal ECG   CXR neg for acute abnormality  CTA chest Negative for acute pulmonary embolus, pneumonia or other acute cardiopulmonary process. 2. Enlarged main and central pulmonary arteries consistent with pulmonary arterial hypertension. 3. Stable cardiomegaly. 4. Scattered areas of dependent atelectasis. 5. Age indeterminate compression fracture of the superior endplate of T3 is a new finding compared to November of 2019   Pt was ambulated with pulse ox home O2. She became dyspneic which is not normal for her and her sats dropped to 91% on RA.   Will plan for admission for further tx of hypervolemia with  concern for new onset heart failure. Pt given 40 lasix IV in the ED.   2:04 PM Discussed case with Dr. Romualdo Bolk with internal medicine residency service who accepts patient for admission.   Final Clinical Impression(s) / ED Diagnoses Final diagnoses:  Hypervolemia, unspecified hypervolemia type    Rx / DC Orders ED Discharge Orders    None       Bishop Dublin 03/06/19 1412    Quintella Reichert, MD 03/07/19 [phone]

## 2019-03-06 NOTE — ED Notes (Signed)
Pt's O2 started at 97% on 3.5 L [place] while at rest. Pt ambulated around room and O2 sat dropped to 91% on 4 L [place]. Pt stated that she felt short of breath while ambulating around the room.

## 2019-03-06 NOTE — ED Notes (Addendum)
ED TO INPATIENT HANDOFF REPORT  ED Nurse Name and Phone #:   S Name/Age/Gender Lindsay [name] 49 y.o. female Room/Bed: 029C/029C  Code Status   Code Status: Full Code  Home/SNF/Other Home Patient oriented to: self, place, time and situation Is this baseline? Yes   Triage Complete: Triage complete  Chief Complaint Acute respiratory failure ([place]) [J96.00]  Triage Note Pt. Stated, I have respiratory failure, chest pain , and swelling that started about 4 days ago.I go to Dr. Internal medicine. I can't seem to get my oxygen up any.     Allergies Allergies  Allergen Reactions  . Lisinopril Rash and Cough    Level of Care/Admitting Diagnosis ED Disposition    ED Disposition Condition Comment   Admit  Hospital Area: [facility] [[id number]]  Level of Care: Telemetry Medical [[id number]]  Covid Evaluation: Confirmed COVID Negative  Date Laboratory Confirmed COVID Negative: 03/06/2019  Diagnosis: Acute respiratory failure ([place]) [518.81.ICD-9-CM]  Admitting Physician: Sid Falcon [phone]  Attending Physician: Sid Falcon [phone]  Estimated length of stay: past midnight tomorrow  Certification:: I certify this patient will need inpatient services for at least 2 midnights       B Medical/Surgery History Past Medical History:  Diagnosis Date  . Acid reflux   . Anemia    Iron Def  . Anorexia   . Chronic kidney disease    Nephrotic syndrome  . Colon polyp 2009  . Depression with anxiety   . Edema leg   . Fibromyalgia   . Hemorrhoids   . Hidradenitis suppurativa   . Hypertension   . IBS (irritable bowel syndrome)   . Low back pain   . Migraines   . Morbidly obese ([place])   . Neuromuscular disorder (H[facility])    fibromyalgia  . Neuropathy   . Panic attacks   . Polyp of vocal cord or larynx   . Tonsil pain    Past Surgical History:  Procedure Laterality Date  . AXILLARY HIDRADENITIS EXCISION    . COLONOSCOPY WITH PROPOFOL N/A 05/25/2015   Procedure:  COLONOSCOPY WITH PROPOFOL;  Surgeon: Milus Banister, MD;  Location: [facility];  Service: Endoscopy;  Laterality: N/A;  . COLONOSCOPY WITH PROPOFOL N/A 12/31/2018   Procedure: COLONOSCOPY WITH PROPOFOL;  Surgeon: Milus Banister, MD;  Location: [facility];  Service: Endoscopy;  Laterality: N/A;  . HEMORRHOID SURGERY     with Hidradenitis surgery   . INGUINAL HIDRADENITIS EXCISION    . TONSILLECTOMY  10/18/2010   by Dr. Wilburn Cornelia  . UPPER GASTROINTESTINAL ENDOSCOPY       A IV Location/Drains/Wounds Patient Lines/Drains/Airways Status   Active Line/Drains/Airways    Name:   Placement date:   Placement time:   Site:   Days:   Peripheral IV 03/06/19 Right Antecubital   03/06/19    0916    Antecubital   less than 1   External Urinary Catheter   03/06/19    1255    --   less than 1          Intake/Output Last 24 hours  Intake/Output Summary (Last 24 hours) at 03/06/2019 1517 Last data filed at 03/06/2019 1253 Gross per 24 hour  Intake --  Output 800 ml  Net -800 ml    Labs/Imaging Results for orders placed or performed during the hospital encounter of 03/06/19 (from the past 48 hour(s))  Basic metabolic panel     Status: Abnormal   Collection Time: 03/06/19  9:20 AM  Result Value Ref Range   Sodium 139 135 - 145 mmol/L   Potassium 3.5 3.5 - 5.1 mmol/L   Chloride 102 98 - 111 mmol/L   CO2 26 22 - 32 mmol/L   Glucose, Bld 91 70 - 99 mg/dL   BUN <5 (L) 6 - 20 mg/dL   Creatinine, Ser 0.95 0.44 - 1.00 mg/dL   Calcium 9.1 8.9 - 10.3 mg/dL   GFR calc non Af Amer >60 >60 mL/min   GFR calc Af Amer >60 >60 mL/min   Anion gap 11 5 - 15    Comment: Performed at [facility] [address], [place], [place] [address]  CBC     Status: Abnormal   Collection Time: 03/06/19  9:20 AM  Result Value Ref Range   WBC 6.9 4.0 - 10.5 K/uL   RBC 5.64 (H) 3.87 - 5.11 MIL/uL   Hemoglobin 15.0 12.0 - 15.0 g/dL   HCT 48.4 (H) 36.0 - 46.0 %   MCV 85.8 80.0 - 100.0 fL   MCH 26.6 26.0 -  34.0 pg   MCHC 31.0 30.0 - 36.0 g/dL   RDW 17.8 (H) 11.5 - 15.5 %   Platelets 500 (H) 150 - 400 K/uL   nRBC 0.3 (H) 0.0 - 0.2 %    Comment: Performed at [facility] [address], [place], [place] [address]  Troponin I (High Sensitivity)     Status: None   Collection Time: 03/06/19  9:20 AM  Result Value Ref Range   Troponin I (High Sensitivity) 15 <18 ng/L    Comment: (NOTE) Elevated high sensitivity troponin I (hsTnI) values and significant  changes across serial measurements may suggest ACS but many other  chronic and acute conditions are known to elevate hsTnI results.  Refer to the Links section for chest pain algorithms and additional  guidance. Performed at [facility], [place] [address] [place], [place] [address]   Hepatic function panel     Status: None   Collection Time: 03/06/19  9:20 AM  Result Value Ref Range   Total Protein 7.2 6.5 - 8.1 g/dL   Albumin 3.8 3.5 - 5.0 g/dL   AST 16 15 - 41 U/L   ALT 20 0 - 44 U/L   Alkaline Phosphatase 67 38 - 126 U/L   Total Bilirubin 0.7 0.3 - 1.2 mg/dL   Bilirubin, Direct <0.1 0.0 - 0.2 mg/dL   Indirect Bilirubin NOT CALCULATED 0.3 - 0.9 mg/dL    Comment: Performed at [facility] [address], [place], [place] [address]  Brain natriuretic peptide     Status: Abnormal   Collection Time: 03/06/19  9:20 AM  Result Value Ref Range   B Natriuretic Peptide 332.8 (H) 0.0 - 100.0 pg/mL    Comment: Performed at [facility] [address], [place], [place] [address]  D-dimer, quantitative (not at [facility])     Status: Abnormal   Collection Time: 03/06/19  9:20 AM  Result Value Ref Range   D-Dimer, Quant 0.92 (H) 0.00 - 0.50 ug/mL-FEU    Comment: (NOTE) At the manufacturer cut-off of 0.50 ug/mL FEU, this assay has been documented to exclude PE with a sensitivity and negative predictive value of 97 to 99%.  At this time, this assay has not been approved by the FDA to exclude DVT/VTE. Results should be  correlated with clinical presentation. Performed at [facility], [place] [address] [place], [place]  [address]   I-Stat beta hCG blood, ED     Status: None   Collection Time: 03/06/19  9:24 AM  Result Value Ref Range   I-stat hCG, quantitative <5.0 <5 mIU/mL   Comment 3            Comment:   GEST. AGE      CONC.  (mIU/mL)   <=1 WEEK        5 - 50     2 WEEKS       50 - 500     3 WEEKS       100 - 10,000     4 WEEKS     1,000 - 30,000        FEMALE AND NON-PREGNANT FEMALE:     LESS THAN 5 mIU/mL   Respiratory Panel by RT PCR (Flu A&B, Covid) - Nasopharyngeal Swab     Status: None   Collection Time: 03/06/19 10:56 AM   Specimen: Nasopharyngeal Swab  Result Value Ref Range   SARS Coronavirus 2 by RT PCR NEGATIVE NEGATIVE    Comment: (NOTE) SARS-CoV-2 target nucleic acids are NOT DETECTED. The SARS-CoV-2 RNA is generally detectable in upper respiratoy specimens during the acute phase of infection. The lowest concentration of SARS-CoV-2 viral copies this assay can detect is 131 copies/mL. A negative result does not preclude SARS-Cov-2 infection and should not be used as the sole basis for treatment or other patient management decisions. A negative result may occur with  improper specimen collection/handling, submission of specimen other than nasopharyngeal swab, presence of viral mutation(s) within the areas targeted by this assay, and inadequate number of viral copies (<131 copies/mL). A negative result must be combined with clinical observations, patient history, and epidemiological information. The expected result is Negative. Fact Sheet for Patients:  PinkCheek.be Fact Sheet for Healthcare Providers:  GravelBags.it This test is not yet ap proved or cleared by the [place] FDA and  has been authorized for detection and/or diagnosis of SARS-CoV-2 by FDA under an Emergency Use Authorization (EUA). This EUA will  remain  in effect (meaning this test can be used) for the duration of the COVID-19 declaration under Section 564(b)(1) of the Act, 21 U.S.C. section 360bbb-3(b)(1), unless the authorization is terminated or revoked sooner.    Influenza A by PCR NEGATIVE NEGATIVE   Influenza B by PCR NEGATIVE NEGATIVE    Comment: (NOTE) The Xpert Xpress SARS-CoV-2/FLU/RSV assay is intended as an aid in  the diagnosis of influenza from Nasopharyngeal swab specimens and  should not be used as a sole basis for treatment. Nasal washings and  aspirates are unacceptable for Xpert Xpress SARS-CoV-2/FLU/RSV  testing. Fact Sheet for Patients: PinkCheek.be Fact Sheet for Healthcare Providers: GravelBags.it This test is not yet approved or cleared by the [place] FDA and  has been authorized for detection and/or diagnosis of SARS-CoV-2 by  FDA under an Emergency Use Authorization (EUA). This EUA will remain  in effect (meaning this test can be used) for the duration of the  Covid-19 declaration under Section 564(b)(1) of the Act, 21  U.S.C. section 360bbb-3(b)(1), unless the authorization is  terminated or revoked. Performed at [facility], [place] [address], [place], [place] [address]   Troponin I (High Sensitivity)     Status: None   Collection Time: 03/06/19 11:17 AM  Result Value Ref Range   Troponin I (High Sensitivity) 14 <18 ng/L    Comment: (NOTE) Elevated high sensitivity troponin I (hsTnI) values  and significant  changes across serial measurements may suggest ACS but many other  chronic and acute conditions are known to elevate hsTnI results.  Refer to the "Links" section for chest pain algorithms and additional  guidance. Performed at [facility], [place] [address] [place], [place] [address]    DG Chest 2 View  Result Date: 03/06/2019 CLINICAL DATA:  Chest pain. EXAM: CHEST - 2 VIEW COMPARISON:  Jun 24, 2018. FINDINGS:  Stable cardiomediastinal silhouette. No pneumothorax or pleural effusion is noted. Both lungs are clear. The visualized skeletal structures are unremarkable. IMPRESSION: No active cardiopulmonary disease. Electronically Signed   By: Marijo Conception M.D.   On: 03/06/2019 10:34   CT Angio Chest PE W and/or Wo Contrast  Result Date: 03/06/2019 CLINICAL DATA:  50 year old female with respiratory failure, chest pain and swelling for the past 4 days. Positive D-dimer, low to intermediate probability for pulmonary emboli. EXAM: CT ANGIOGRAPHY CHEST WITH CONTRAST TECHNIQUE: Multidetector CT imaging of the chest was performed using the standard protocol during bolus administration of intravenous contrast. Multiplanar CT image reconstructions and MIPs were obtained to evaluate the vascular anatomy. CONTRAST:  61mL OMNIPAQUE IOHEXOL 350 MG/ML SOLN COMPARISON:  Prior CT scan of the chest 12/04/2017 FINDINGS: Cardiovascular: Excellent opacification of the pulmonary arteries to the proximal segmental level. No evidence of central filling defect to suggest acute pulmonary embolus. However, the main pulmonary artery is markedly enlarged at 4.2 cm indicative of pulmonary arterial hypertension. The heart is enlarged. The aorta is normal in caliber. No evidence of aneurysm. No significant atherosclerotic plaque. Conventional 3 vessel arch anatomy. No pericardial effusion. Mediastinum/Nodes: Unremarkable CT appearance of the thyroid gland. No suspicious mediastinal or hilar adenopathy. No soft tissue mediastinal mass. The thoracic esophagus is unremarkable. Lungs/Pleura: Mild dependent atelectasis in the lower lobes. Areas of linear atelectasis versus scarring are also identified in the inferior right middle lobe and lingula. No suspicious pulmonary mass or nodule. No evidence of pleural effusion, pulmonary edema or pneumothorax. Upper Abdomen: No acute abnormality in the upper abdomen. Musculoskeletal: Compression deformity present  at T3. There is less than 20% height loss anteriorly. This is a new finding compared to prior imaging from November 2019. Review of the MIP images confirms the above findings. IMPRESSION: 1. Negative for acute pulmonary embolus, pneumonia or other acute cardiopulmonary process. 2. Enlarged main and central pulmonary arteries consistent with pulmonary arterial hypertension. 3. Stable cardiomegaly. 4. Scattered areas of dependent atelectasis. 5. Age indeterminate compression fracture of the superior endplate of T3 is a new finding compared to November of 2019. Electronically Signed   By: Jacqulynn Cadet M.D.   On: 03/06/2019 12:35    Pending Labs Unresulted Labs (From admission, onward)    Start     Ordered   03/13/19 0500  Creatinine, serum  (enoxaparin (LOVENOX)    CrCl >/= 30 ml/min)  Weekly,   R    Comments: while on enoxaparin therapy    03/06/19 1456   03/07/19 [id number]  Basic metabolic panel  Tomorrow morning,   R     03/06/19 1456   03/07/19 0500  CBC  Tomorrow morning,   R     03/06/19 1456   03/06/19 1450  HIV Antibody (routine testing w rflx)  (HIV Antibody (Routine testing w reflex) panel)  Once,   STAT     03/06/19 1456   03/06/19 1450  CBC  (enoxaparin (LOVENOX)    CrCl >/= 30 ml/min)  Once,   STAT  Comments: Baseline for enoxaparin therapy IF NOT ALREADY DRAWN.  Notify MD if PLT < 100 K.    03/06/19 1456   03/06/19 1450  Creatinine, serum  (enoxaparin (LOVENOX)    CrCl >/= 30 ml/min)  Once,   STAT    Comments: Baseline for enoxaparin therapy IF NOT ALREADY DRAWN.    03/06/19 1456          Vitals/Pain Today's Vitals   03/06/19 1421 03/06/19 1430 03/06/19 1430 03/06/19 1445  BP: (!) 150/121   (!) 149/107  Pulse:  92  99  Resp: 15 16  15   Temp:      TempSrc:      SpO2:  99%  98%  Weight:      Height:      PainSc:   9      Isolation Precautions No active isolations  Medications Medications  enoxaparin (LOVENOX) injection 80 mg (has no administration in time  range)  acetaminophen (TYLENOL) tablet 650 mg (has no administration in time range)    Or  acetaminophen (TYLENOL) suppository 650 mg (has no administration in time range)  senna-docusate (Senokot-S) tablet 1 tablet (has no administration in time range)  promethazine (PHENERGAN) tablet 12.5 mg (has no administration in time range)  albuterol (PROVENTIL) (2.5 MG/3ML) 0.083% nebulizer solution 2.5 mg (has no administration in time range)  cholecalciferol (VITAMIN D3) tablet 1,000 Units (has no administration in time range)  pregabalin (LYRICA) capsule 300 mg (has no administration in time range)  amLODipine (NORVASC) tablet 5 mg (has no administration in time range)  lamoTRIgine (LAMICTAL) tablet 25 mg (has no administration in time range)  mometasone-formoterol (DULERA) 200-5 MCG/ACT inhaler 2 puff (has no administration in time range)  pantoprazole (PROTONIX) EC tablet 80 mg (has no administration in time range)  sodium chloride flush (NS) 0.9 % injection 3 mL (3 mLs Intravenous Given 03/06/19 0916)  aspirin chewable tablet 324 mg (324 mg Oral Given 03/06/19 1023)  albuterol (VENTOLIN HFA) 108 (90 Base) MCG/ACT inhaler 2 puff (2 puffs Inhalation Given 03/06/19 1023)  furosemide (LASIX) injection 40 mg (40 mg Intravenous Given 03/06/19 1117)  iohexol (OMNIPAQUE) 350 MG/ML injection 100 mL (77 mLs Intravenous Contrast Given 03/06/19 1203)    Mobility walks Low fall risk   Focused Assessments Cardiac Assessment Handoff:  Cardiac Rhythm: Normal sinus rhythm Lab Results  Component Value Date   TROPONINI <0.03 06/24/2018   Lab Results  Component Value Date   DDIMER 0.92 (H) 03/06/2019   Does the Patient currently have chest pain?     R Recommendations: See Admitting Provider Note  Report given to:   Additional Notes: Pt uses 3-4L [place] at baseline Trop x2 Neg BNP elevated D-dimer elevated  CXR Negative CTA- Neg for PE  Pt received 40mg  Lasix IV   Pt was ambulated with pulse ox  home O2. She became dyspneic which is not normal for her and her sats dropped to 91% on RA.

## 2019-03-06 NOTE — ED Notes (Addendum)
Attempted report x1. 

## 2019-03-06 NOTE — ED Notes (Signed)
Pt to CT

## 2019-03-06 NOTE — ED Triage Notes (Signed)
Pt. Stated, I have respiratory failure, chest pain , and swelling that started about 4 days ago.I go to Dr. Internal medicine. I can't seem to get my oxygen up any.

## 2019-03-07 ENCOUNTER — Inpatient Hospital Stay (HOSPITAL_COMMUNITY): Payer: Medicare Other

## 2019-03-07 DIAGNOSIS — J9621 Acute and chronic respiratory failure with hypoxia: Secondary | ICD-10-CM

## 2019-03-07 DIAGNOSIS — K219 Gastro-esophageal reflux disease without esophagitis: Secondary | ICD-10-CM

## 2019-03-07 DIAGNOSIS — G8929 Other chronic pain: Secondary | ICD-10-CM

## 2019-03-07 DIAGNOSIS — J449 Chronic obstructive pulmonary disease, unspecified: Secondary | ICD-10-CM

## 2019-03-07 DIAGNOSIS — F329 Major depressive disorder, single episode, unspecified: Secondary | ICD-10-CM

## 2019-03-07 DIAGNOSIS — R0602 Shortness of breath: Secondary | ICD-10-CM

## 2019-03-07 DIAGNOSIS — M8448XA Pathological fracture, other site, initial encounter for fracture: Secondary | ICD-10-CM

## 2019-03-07 DIAGNOSIS — I10 Essential (primary) hypertension: Secondary | ICD-10-CM

## 2019-03-07 DIAGNOSIS — Z9981 Dependence on supplemental oxygen: Secondary | ICD-10-CM

## 2019-03-07 DIAGNOSIS — Z79899 Other long term (current) drug therapy: Secondary | ICD-10-CM

## 2019-03-07 LAB — ECHOCARDIOGRAM COMPLETE
Height: 71 in
Weight: 5629.67 oz

## 2019-03-07 LAB — CBC
HCT: 48.3 % — ABNORMAL HIGH (ref 36.0–46.0)
Hemoglobin: 14.9 g/dL (ref 12.0–15.0)
MCH: 26.3 pg (ref 26.0–34.0)
MCHC: 30.8 g/dL (ref 30.0–36.0)
MCV: 85.3 fL (ref 80.0–100.0)
Platelets: 499 10*3/uL — ABNORMAL HIGH (ref 150–400)
RBC: 5.66 MIL/uL — ABNORMAL HIGH (ref 3.87–5.11)
RDW: 16.8 % — ABNORMAL HIGH (ref 11.5–15.5)
WBC: 10 10*3/uL (ref 4.0–10.5)
nRBC: 0 % (ref 0.0–0.2)

## 2019-03-07 LAB — BASIC METABOLIC PANEL
Anion gap: 12 (ref 5–15)
BUN: 11 mg/dL (ref 6–20)
CO2: 29 mmol/L (ref 22–32)
Calcium: 9.1 mg/dL (ref 8.9–10.3)
Chloride: 99 mmol/L (ref 98–111)
Creatinine, Ser: 1.11 mg/dL — ABNORMAL HIGH (ref 0.44–1.00)
GFR calc Af Amer: >60 mL/min (ref >60)
GFR calc non Af Amer: 58 mL/min — ABNORMAL LOW (ref >60)
Glucose, Bld: 115 mg/dL — ABNORMAL HIGH (ref 70–99)
Potassium: 4.3 mmol/L (ref 3.5–5.1)
Sodium: 140 mmol/L (ref 135–145)

## 2019-03-07 MED ORDER — CELECOXIB 200 MG PO CAPS
400.0000 mg | ORAL_CAPSULE | Freq: Once | ORAL | Status: AC
  Administered 2019-03-07: 400 mg via ORAL
  Filled 2019-03-07: qty 2
  Filled 2019-03-07: qty 1

## 2019-03-07 MED ORDER — ALBUTEROL SULFATE (2.5 MG/3ML) 0.083% IN NEBU
2.5000 mg | INHALATION_SOLUTION | Freq: Two times a day (BID) | RESPIRATORY_TRACT | Status: DC
  Administered 2019-03-07 – 2019-03-08 (×3): 2.5 mg via RESPIRATORY_TRACT
  Filled 2019-03-07 (×3): qty 3

## 2019-03-07 MED ORDER — IRBESARTAN 150 MG PO TABS
150.0000 mg | ORAL_TABLET | Freq: Every day | ORAL | Status: DC
  Administered 2019-03-07 – 2019-03-08 (×2): 150 mg via ORAL
  Filled 2019-03-07 (×2): qty 1

## 2019-03-07 MED ORDER — LIDOCAINE 5 % EX PTCH: 1.0000 | MEDICATED_PATCH | CUTANEOUS | Status: DC

## 2019-03-07 MED ORDER — LIDOCAINE 5 % EX PTCH
1.0000 | MEDICATED_PATCH | CUTANEOUS | Status: DC
  Administered 2019-03-07 – 2019-03-08 (×2): 1 via TRANSDERMAL
  Filled 2019-03-07 (×3): qty 1

## 2019-03-07 MED ORDER — HYDROXYZINE HCL 25 MG PO TABS
50.0000 mg | ORAL_TABLET | Freq: Once | ORAL | Status: AC
  Administered 2019-03-07: 50 mg via ORAL
  Filled 2019-03-07: qty 2

## 2019-03-07 MED ORDER — BREXPIPRAZOLE 1 MG PO TABS
3.0000 mg | ORAL_TABLET | Freq: Every day | ORAL | Status: DC
  Administered 2019-03-07 – 2019-03-08 (×2): 3 mg via ORAL
  Filled 2019-03-07 (×2): qty 3

## 2019-03-07 MED ORDER — VARENICLINE TARTRATE 1 MG PO TABS
1.0000 mg | ORAL_TABLET | Freq: Two times a day (BID) | ORAL | Status: DC
  Administered 2019-03-07 – 2019-03-08 (×3): 1 mg via ORAL
  Filled 2019-03-07 (×4): qty 1

## 2019-03-07 NOTE — Progress Notes (Addendum)
  Subjective:  Lindsay [name] was seen laying in her bed this morning She stated that she feels terrible. She states that her back hurts and this makes it hard for her to take a deep breath.  Objective:   Vital Signs (last 24 hours): Vitals:   03/06/19 1901 03/06/19 1947 03/06/19 2237 03/07/19 0510  BP:   (!) 149/98   Pulse:  92 100   Resp: (!) 22 20 20    Temp:   99.1 F (37.3 C)   TempSrc:   Oral   SpO2:  99% 98%   Weight:    (!) 159.6 kg  Height:       Physical Exam: General Alert and answers questions appropriately  Cardiac Regular rate and rhythm, no murmurs, rubs, or gallops  Pulmonary Diminished breath sounds. No wheezes, rhonchi, or rales   BMP Latest Ref Rng & Units 03/07/2019 03/06/2019 03/06/2019  Glucose 70 - 99 mg/dL 115(H) - 91  BUN 6 - 20 mg/dL 11 - <5(L)  Creatinine 0.44 - 1.00 mg/dL 1.11(H) 0.96 0.95  BUN/Creat Ratio 9 - 23 - - -  Sodium 135 - 145 mmol/L 140 - 139  Potassium 3.5 - 5.1 mmol/L 4.3 - 3.5  Chloride 98 - 111 mmol/L 99 - 102  CO2 22 - 32 mmol/L 29 - 26  Calcium 8.9 - 10.3 mg/dL 9.1 - 9.1   CBC Latest Ref Rng & Units 03/07/2019 03/06/2019 03/06/2019  WBC 4.0 - 10.5 K/uL 10.0 7.8 6.9  Hemoglobin 12.0 - 15.0 g/dL 14.9 14.8 15.0  Hematocrit 36.0 - 46.0 % 48.3(H) 47.9(H) 48.4(H)  Platelets 150 - 400 K/uL 499(H) 469(H) 500(H)    Assessment/Plan:   Active Problems:   Acute respiratory failure [facility])  Patient is a 49 year old female with past medical history significant for COPD on 4 L at home, hypertension, and depression who presented on 03/06/2019 with a 7-day history of shortness of breath.  # Acute hypoxic respiratory failure: # COPD: Patient with increased oxygen requirement at home from baseline, pleuritic chest pain, CT angiogram on admission was negative for PE, but did demonstrate findings consistent with pulmonary arterial hypertension. Patient does have COPD but is without wheezing, increased cough, or other signs of exacerbation. Patient articulates  history consistent with heart failure but patient is without history of heart failure and patient appears euvolemic on exam. CTA chest did reveal a compression fracture at T3. Patient's symptoms may be 2/2 hypoventilation from pain. Will work on adequate pain control * TTE demonstrates EF 45-50% and indeterminate diastolic dysfunction. Pulmonary pressures not commented on. Will followup with cardiology *Patient was given 40 mg IV Lasix yesterday and has had 800 mL output recorded, no weight this a.m. *Albuterol nebulizer every 4 hours *Dulera 2 puffs twice daily *PT/OT evaluation *Continuous pulse ox, telemetry *Strict I's and O's, daily weights * Patient would benefit from outpatient sleep study  # Compression fracture: * Lidocaine patch, ibuprofen and tylenol PRN * Outpatient sleep study  # Hypertension: Amlodipine 5 mg daily  # GERD: Pantoprazole 80 mg daily  Diet: Heart healthy DVT Ppx: Lovenox 80 mg daily Dispo: Anticipated discharge in approximately 0-1 days  Jeanmarie Hubert, MD 03/07/2019, 6:21 AM Pager: [phone]

## 2019-03-07 NOTE — Progress Notes (Signed)
Spoke with patient about findings on echocardiogram.  Discussed the plan to optimize her pain management and have outpatient follow-up for sleep study.  Patient was tearful during interview and expressed frustration that we were not fixing her.  Patient states that she is very unhappy with the care that she received at this hospital and plans on talking with the [facility] about her experience.  I encouraged patient to share her experience and that we would value her feedback.  When asked to elaborate on her frustrations, patient states she is very frustrated as she has chronic pain and has been tried on many different medications without success.  Patient states that she was seen in pain clinic but was unable to be started on medications due to her psychiatric medications.  I counseled patient that there are risks associated with using narcotic medications especially considering that she is having shortness of breath secondary to decreased ventilation.  We discussed trying lidocaine patches and celecoxib for pain control.  If patient has improvement with this, we can send a prescription and follow-up outpatient.  Jeanmarie Hubert, MD 03/07/2019, 3:09 PM

## 2019-03-07 NOTE — Evaluation (Signed)
Physical Therapy Evaluation Patient Details Name: Lindsay [name] MRN: [id number] DOB: 03-02-1970 Today's Date: 03/07/2019   History of Present Illness  49 year old female with PMH significant for COPD on 4L at home, HTN, and depression who presented 03/06/19 with a 7 day history of shortness of breath. CT angiogram negative for PE but consistent with pulmonary arterial HTN. PMHx-chronic kidney disease, depression/anxiety, fibromyalgia, neuropathy,   Clinical Impression   Patient evaluated by Physical Therapy with no further acute PT needs identified. Patient educated on importance of wearing oxygen at all times (prescribed 4L home O2) as she admitted walking to bathroom without O2 or assistance last night. Educated on effect of low sats on her heart and brain. She verbalized understanding. All education has been completed and the patient has no further questions.  See below for any follow-up Physical Therapy or equipment needs. PT is signing off. Thank you for this referral.     Follow Up Recommendations Outpatient PT (pt wants to return to [facility]--previous appt cancelled due to illness); has been working on balance, posture (related to back pain).     Equipment Recommendations  None recommended by PT    Recommendations for Other Services       Precautions / Restrictions Precautions Precautions: Other (comment) Precaution Comments: monitor sats, HR Restrictions Weight Bearing Restrictions: No      Mobility  Bed Mobility Overal bed mobility: Modified Independent                Transfers Overall transfer level: Independent Equipment used: None                Ambulation/Gait Ambulation/Gait assistance: Independent Gait Distance (Feet): 150 Feet Assistive device: None Gait Pattern/deviations: WFL(Within Functional Limits) Gait velocity: WNL   General Gait Details: on 4L (uses same at home) with sats >90% until final 10 ft decr to 89%) seated rest with sats  90% in 30 seconds; by end of session on 4L 95%  Stairs            Wheelchair Mobility    Modified Rankin (Stroke Patients Only)       Balance Overall balance assessment: No apparent balance deficits (not formally assessed)(reports difficulty stepping over tub into shower)                                           Pertinent Vitals/Pain Pain Assessment: Faces Faces Pain Scale: Hurts little more Pain Location: rt lower back Pain Descriptors / Indicators: Aching;Discomfort Pain Intervention(s): Limited activity within patient's tolerance;Monitored during session;Repositioned    Home Living Family/patient expects to be discharged to:: Private residence Living Arrangements: Children;Parent(23 yo daughter, 32 yo mother) Available Help at Discharge: Family;Personal care attendant(aide comes 6 days/week for 2-3 hrs (B/D/cleaning)) Type of Home: House Home Access: Stairs to enter Entrance Stairs-Rails: Right Entrance Stairs-Number of Steps: 2 Home Layout: One level Home Equipment: Walker - 4 wheels;Other (comment)(home O2 )      Prior Function Level of Independence: Needs assistance   Gait / Transfers Assistance Needed: uses rollator only outside (too tight/small inside to use it); uses no device inside and denies furniture walking  ADL's / Homemaking Assistance Needed: assist by aide or mother for bathing (especially if gets in shower, mostly doing "sink baths")        Hand Dominance        Extremity/Trunk Assessment  Upper Extremity Assessment Upper Extremity Assessment: Overall WFL for tasks assessed    Lower Extremity Assessment Lower Extremity Assessment: Overall WFL for tasks assessed    Cervical / Trunk Assessment Cervical / Trunk Assessment: Other exceptions Cervical / Trunk Exceptions: mild kyphosis; obese  Communication   Communication: No difficulties  Cognition Arousal/Alertness: Awake/alert Behavior During Therapy: WFL for  tasks assessed/performed Overall Cognitive Status: Within Functional Limits for tasks assessed                                        General Comments General comments (skin integrity, edema, etc.): Patient reports she was scheduled to be seen at [facility] and had to cancel appt due to illness; rescheduled and cancelled due to transportation issues; pt wants to return for back pain and balance training    Exercises     Assessment/Plan    PT Assessment All further PT needs can be met in the next venue of care  PT Problem List Decreased activity tolerance;Decreased balance;Decreased mobility;Cardiopulmonary status limiting activity;Obesity;Pain       PT Treatment Interventions      PT Goals (Current goals can be found in the Care Plan section)  Acute Rehab PT Goals Patient Stated Goal: to get stronger and balance better so she can get in/out of shower safely PT Goal Formulation: All assessment and education complete, DC therapy    Frequency     Barriers to discharge        Co-evaluation               AM-PAC PT "6 Clicks" Mobility  Outcome Measure Help needed turning from your back to your side while in a flat bed without using bedrails?: A Little Help needed moving from lying on your back to sitting on the side of a flat bed without using bedrails?: A Little Help needed moving to and from a bed to a chair (including a wheelchair)?: None Help needed standing up from a chair using your arms (e.g., wheelchair or bedside chair)?: None Help needed to walk in hospital room?: None Help needed climbing 3-5 steps with a railing? : A Little 6 Click Score: 21    End of Session Equipment Utilized During Treatment: Oxygen Activity Tolerance: Patient tolerated treatment well Patient left: in chair;with call bell/phone within reach Nurse Communication: Mobility status;Other (comment)(sats while walking; [facility] Neuro referral) PT Visit Diagnosis: Difficulty in  walking, not elsewhere classified (R26.2)    Time: 2010-0712 PT Time Calculation (min) (ACUTE ONLY): 32 min   Charges:   PT Evaluation $PT Eval Low Complexity: 1 Low PT Treatments $Self Care/Home Management: 8-22         Arby Barrette, PT Pager [phone]   Rexanne Mano 03/07/2019, 10:50 AM

## 2019-03-07 NOTE — Progress Notes (Signed)
Echocardiogram 2D Echocardiogram has been performed.  Lindsay [name] G Jamier Urbas 03/07/2019, 9:55 AM

## 2019-03-07 NOTE — Progress Notes (Signed)
Occupational Therapy Treatment Patient Details Name: Analecia Ytuarte MRN: [id number] DOB: Mar 08, 1970 Today's Date: 03/07/2019    History of present illness 49 year old female with PMH significant for COPD on 4L at home, HTN, and depression who presented 03/06/19 with a 7 day history of shortness of breath. CT angiogram negative for PE but consistent with pulmonary arterial HTN. PMHx-chronic kidney disease, depression/anxiety, fibromyalgia, neuropathy,    OT comments  Pt provided with AE and was instructed in it's use to increase her independence with LB ADLs.  She was able to return demonstration.    Follow Up Recommendations  No OT follow up;Supervision - Intermittent    Equipment Recommendations  None recommended by OT    Recommendations for Other Services      Precautions / Restrictions Precautions Precautions: Other (comment) Precaution Comments: monitor sats, HR       Mobility Bed Mobility Overal bed mobility: Modified Independent                Transfers Overall transfer level: Independent Equipment used: None                  Balance Overall balance assessment: No apparent balance deficits (not formally assessed)(reports difficulty stepping over tub into shower)                                         ADL either performed or assessed with clinical judgement   ADL Overall ADL's : At baseline;Needs assistance/impaired           Upper Body Bathing Details (indicate cue type and reason): Pt reports she performs mod I (except for her back), but lifting her pannus is challenging, and wears her out, and causes pain            Lower Body Dressing Details (indicate cue type and reason): Pt is able to perform at mod I, but reports she fatigues rapidly and some days has difficulty accessing her feet                General ADL Comments: Pt provided with AE and was instructed in use of LH shoe horn, LH bath sponge, Sock aid, toieting  aid, reacher, and sock aid.  She was able to verbalize understanding and return demonstration      Vision Baseline Vision/History: Wears glasses Patient Visual Report: No change from baseline     Perception     Praxis      Cognition Arousal/Alertness: Awake/alert Behavior During Therapy: WFL for tasks assessed/performed Overall Cognitive Status: Within Functional Limits for tasks assessed                                          Exercises     Shoulder Instructions       General Comments Pt verbalizes desire to increase her independence and not rely on her family or her aid.  She reports OPPT was working with her on overall strengthening and conditioning with the goal of her being able to transition to a gym.  Discussed AE with her and she is very interested in this option to increase her independence     Pertinent Vitals/ Pain       Pain Assessment: Faces Faces Pain Scale: Hurts little more Pain Location: rt lower back  Pain Descriptors / Indicators: Aching;Discomfort Pain Intervention(s): Monitored during session  Home Living Family/patient expects to be discharged to:: Private residence Living Arrangements: Children;Parent(23 yo daughter, 24 yo mother) Available Help at Discharge: Family;Personal care attendant(aide comes 6 days/week for 2-3 hrs (B/D/cleaning)) Type of Home: House Home Access: Stairs to enter [employer] of Steps: 2 Entrance Stairs-Rails: Right Home Layout: One level     Bathroom Shower/Tub: Tub/shower unit;Door   Bathroom Toilet: Handicapped height Bathroom Accessibility: No   Home Equipment: Environmental consultant - 4 wheels;Other (comment)(home O2 )          Prior Functioning/Environment Level of Independence: Needs assistance  Gait / Transfers Assistance Needed: uses rollator only outside (too tight/small inside to use it); uses no device inside and denies furniture walking ADL's / Homemaking Assistance Needed: assist by aide  or mother for bathing (especially if gets in shower, mostly doing "sink baths")  She reports she is typically able to perform dressing mod I, but family, or her aide assist if she is unable to perform        Frequency  Min 2X/week        Progress Toward Goals  OT Goals(current goals can now be found in the care plan section)  Progress towards OT goals: Progressing toward goals  Acute Rehab OT Goals Patient Stated Goal: to be independent and not rely on others  OT Goal Formulation: With patient Time For Goal Achievement: 03/21/19 Potential to Achieve Goals: Good ADL Goals Additional ADL Goal #1: Pt will be independent with use of AE to improve efficiency of performing ADLs Additional ADL Goal #2: Pt will verbalize understanding of energy conservation techniques  Plan Discharge plan remains appropriate    Co-evaluation                 AM-PAC OT "6 Clicks" Daily Activity     Outcome Measure   Help from another person eating meals?: None Help from another person taking care of personal grooming?: None Help from another person toileting, which includes using toliet, bedpan, or urinal?: None Help from another person bathing (including washing, rinsing, drying)?: None Help from another person to put on and taking off regular upper body clothing?: None Help from another person to put on and taking off regular lower body clothing?: None 6 Click Score: 24    End of Session Equipment Utilized During Treatment: Oxygen  OT Visit Diagnosis: Pain Pain - part of body: (back )   Activity Tolerance Patient limited by fatigue   Patient Left in bed;with call bell/phone within reach;Other (comment)(MD with pt )   Nurse Communication          Time: [phone] OT Time Calculation (min): 24 min  Charges: OT General Charges $OT Visit: 1 Visit OT Evaluation $OT Eval Moderate Complexity: 1 Mod OT Treatments $Self Care/Home Management : 23-37 mins  Nilsa Nutting., OTR/L Acute  Rehabilitation Services Pager [phone] Office [place], [place] 03/07/2019, 2:46 PM

## 2019-03-07 NOTE — Progress Notes (Signed)
  Date: 03/07/2019  Patient name: Lindsay [name]  Medical record number: [id number]  Date of birth: April 20, 1970   I have seen and evaluated Lindsay [name] and discussed their care with the Residency Team. Briefly, [name] is a 49 year old woman with PMH of COPD with chronic hypoxic respiratory failure (on 4LNC at home), HTN, depression who presented for worsening SOB.  She further noted chest pain and back pain that were worse with taking a deep breath.  She has also felt bloated and had leg swelling which has made it harder for her to be active over the time period.  She is saturating well on her home oxygen dose when I saw her. CT chest showed no PE or pneumonia.  She had enlarged pulmonary arteries concerning for PAH, she also had a subacute compression fracture of T3.    Vitals:   03/07/19 0720 03/07/19 0822  BP:  (!) 163/110  Pulse:  98  Resp:  (!) 23  Temp:  98.9 F (37.2 C)  SpO2: 99% 98%   General: Lying in bed, states she is feeling terrible Eyes: + conjunctival injection bilaterally Neck: Neck veins are flat CV: Mildly tachycardic, regular rhythm, non tender to palpation on the chest Pulm: CTAB, she had difficulty taking a deep breath due to pain, no wheezing Abd: Obese, NT, ND, +BS MSK: She has pain to palpation of the paraspinal muscles of the T spine, she has pain to palpation and decreased passive ROM of the right shoulder Psych: Tearful, frustrated with breathing issues.   Assessment and Plan: I have seen and evaluated the patient as outlined above. I agree with the formulated Assessment and Plan as detailed in the residents' note, with the following changes:   1. Worsening dyspnea COPD Enlarged pulmonary arteries -- ? PAH Possible OSA (outpatient work up is ongoing) - No clear diagnosis for her worsening SOB.  She is certainly having difficulty taking a deep breath due to pain.  - PT, pain control with voltaren, lidocaine patches - Continue oxygen - TTE pending - if PAP  is high, consider RHC - Outpatient sleep study  2. Compression Fracture, subacute - Pain control - Outpatient DEXA  Possible discharge today if she remains on a stable dose of oxygen.   Sid Falcon, MD 2/7/20218:32 AM

## 2019-03-07 NOTE — Evaluation (Signed)
Occupational Therapy Evaluation Patient Details Name: Lindsay [name] MRN: [id number] DOB: September 14, 1970 Today's Date: 03/07/2019    History of Present Illness 49 year old female with PMH significant for COPD on 4L at home, HTN, and depression who presented 03/06/19 with a 7 day history of shortness of breath. CT angiogram negative for PE but consistent with pulmonary arterial HTN. PMHx-chronic kidney disease, depression/anxiety, fibromyalgia, neuropathy,    Clinical Impression   Pt admitted with above. She demonstrates the below listed deficits and will benefit from continued OT to maximize safety and independence with BADLs.  Pt presents to OT with generalized weakness and pain.  She appears to be at her baseline level of functioning, but would benefit from instruction in adaptive equipment to increase the ease and independence in which she performs ADLs as well as instruction in energy conservation techniques.  Will see again today to address these needs.  She lives with her daughter and mother, and has a HHaide who assists her as needed, but she ultimately would prefer to regain full independence.        Follow Up Recommendations  No OT follow up;Supervision - Intermittent    Equipment Recommendations  None recommended by OT(pt reports she has a tub transfer bench at home )    Recommendations for Other Services       Precautions / Restrictions Precautions Precautions: Other (comment) Precaution Comments: monitor sats, HR Restrictions Weight Bearing Restrictions: No      Mobility Bed Mobility Overal bed mobility: Modified Independent                Transfers Overall transfer level: Independent Equipment used: None                  Balance Overall balance assessment: No apparent balance deficits (not formally assessed)(reports difficulty stepping over tub into shower)                                         ADL either performed or assessed with  clinical judgement   ADL Overall ADL's : At baseline;Needs assistance/impaired           Upper Body Bathing Details (indicate cue type and reason): Pt reports she performs mod I (except for her back), but lifting her pannus is challenging, and wears her out, and causes pain            Lower Body Dressing Details (indicate cue type and reason): Pt is able to perform at mod I, but reports she fatigues rapidly and some days has difficulty accessing her feet                General ADL Comments: Pt reports she she struggles to step into the bathtub due to height of tub  Discussed options for tub DME, and removing shower doors to allow for use of tub tranfer bench, however, she lives in a rental home and is unsure if her lanlord will allow for the doors to be removed  She reports she will investigate options - she does have a tub transfer bench in a box that a friend acquired for her     Vision Baseline Vision/History: Wears glasses Patient Visual Report: No change from baseline       Perception     Praxis      Pertinent Vitals/Pain Pain Assessment: Faces Faces Pain Scale: Hurts little more Pain Location:  rt lower back Pain Descriptors / Indicators: Aching;Discomfort Pain Intervention(s): Premedicated before session;Limited activity within patient's tolerance;Repositioned     Hand Dominance Right   Extremity/Trunk Assessment Upper Extremity Assessment Upper Extremity Assessment: Overall WFL for tasks assessed   Lower Extremity Assessment Lower Extremity Assessment: Overall WFL for tasks assessed   Cervical / Trunk Assessment Cervical / Trunk Assessment: Other exceptions Cervical / Trunk Exceptions: mild kyphosis; obese   Communication Communication Communication: No difficulties   Cognition Arousal/Alertness: Awake/alert Behavior During Therapy: WFL for tasks assessed/performed Overall Cognitive Status: Within Functional Limits for tasks assessed                                      General Comments  Pt verbalizes desire to increase her independence and not rely on her family or her aid.  She reports OPPT was working with her on overall strengthening and conditioning with the goal of her being able to transition to a gym.  Discussed AE with her and she is very interested in this option to increase her independence     Exercises     Shoulder Instructions      Home Living Family/patient expects to be discharged to:: Private residence Living Arrangements: Children;Parent(23 yo daughter, 84 yo mother) Available Help at Discharge: Family;Personal care attendant(aide comes 6 days/week for 2-3 hrs (B/D/cleaning)) Type of Home: House Home Access: Stairs to enter [employer] of Steps: 2 Entrance Stairs-Rails: Right Home Layout: One level     Bathroom Shower/Tub: Tub/shower unit;Door   Bathroom Toilet: Handicapped height Bathroom Accessibility: No   Home Equipment: Environmental consultant - 4 wheels;Other (comment)(home O2 )          Prior Functioning/Environment Level of Independence: Needs assistance  Gait / Transfers Assistance Needed: uses rollator only outside (too tight/small inside to use it); uses no device inside and denies furniture walking ADL's / Homemaking Assistance Needed: assist by aide or mother for bathing (especially if gets in shower, mostly doing "sink baths")  She reports she is typically able to perform dressing mod I, but family, or her aide assist if she is unable to perform             OT Problem List: Decreased strength;Decreased activity tolerance;Cardiopulmonary status limiting activity;Obesity;Pain      OT Treatment/Interventions: Self-care/ADL training;DME and/or AE instruction;Energy conservation;Therapeutic activities;Patient/family education    OT Goals(Current goals can be found in the care plan section) Acute Rehab OT Goals Patient Stated Goal: to be independent and not rely on others  OT Goal  Formulation: With patient Time For Goal Achievement: 03/21/19 Potential to Achieve Goals: Good ADL Goals Additional ADL Goal #1: Pt will be independent with use of AE to improve efficiency of performing ADLs Additional ADL Goal #2: Pt will verbalize understanding of energy conservation techniques  OT Frequency: Min 2X/week   Barriers to D/C:            Co-evaluation              AM-PAC OT "6 Clicks" Daily Activity     Outcome Measure Help from another person eating meals?: None Help from another person taking care of personal grooming?: None Help from another person toileting, which includes using toliet, bedpan, or urinal?: None Help from another person bathing (including washing, rinsing, drying)?: A Little Help from another person to put on and taking off regular upper body clothing?: None Help from another  person to put on and taking off regular lower body clothing?: A Little 6 Click Score: 22   End of Session Equipment Utilized During Treatment: Oxygen  Activity Tolerance: Patient limited by fatigue Patient left: in bed;with call bell/phone within reach  OT Visit Diagnosis: Pain Pain - part of body: (back )                Time: [id number] OT Time Calculation (min): 24 min Charges:  OT General Charges $OT Visit: 1 Visit OT Evaluation $OT Eval Moderate Complexity: 1 Mod OT Treatments $Self Care/Home Management : 8-22 mins  Nilsa Nutting., OTR/L Acute Rehabilitation Services Pager [phone] Office [phone]   Lucille Passy M 03/07/2019, 12:42 PM

## 2019-03-08 DIAGNOSIS — S22000A Wedge compression fracture of unspecified thoracic vertebra, initial encounter for closed fracture: Secondary | ICD-10-CM | POA: Insufficient documentation

## 2019-03-08 DIAGNOSIS — M8000XA Age-related osteoporosis with current pathological fracture, unspecified site, initial encounter for fracture: Secondary | ICD-10-CM

## 2019-03-08 DIAGNOSIS — J9601 Acute respiratory failure with hypoxia: Secondary | ICD-10-CM

## 2019-03-08 DIAGNOSIS — F419 Anxiety disorder, unspecified: Secondary | ICD-10-CM

## 2019-03-08 DIAGNOSIS — Z888 Allergy status to other drugs, medicaments and biological substances status: Secondary | ICD-10-CM

## 2019-03-08 LAB — BASIC METABOLIC PANEL
Anion gap: 16 — ABNORMAL HIGH (ref 5–15)
BUN: 11 mg/dL (ref 6–20)
CO2: 28 mmol/L (ref 22–32)
Calcium: 9 mg/dL (ref 8.9–10.3)
Chloride: 100 mmol/L (ref 98–111)
Creatinine, Ser: 0.92 mg/dL (ref 0.44–1.00)
GFR calc Af Amer: >60 mL/min (ref >60)
GFR calc non Af Amer: >60 mL/min (ref >60)
Glucose, Bld: 113 mg/dL — ABNORMAL HIGH (ref 70–99)
Potassium: 3.7 mmol/L (ref 3.5–5.1)
Sodium: 144 mmol/L (ref 135–145)

## 2019-03-08 MED ORDER — HYDROCODONE-ACETAMINOPHEN 7.5-325 MG PO TABS: 1.0000 | ORAL_TABLET | Freq: Four times a day (QID) | ORAL | 0 refills | Status: DC | PRN

## 2019-03-08 MED ORDER — HYDROCODONE-ACETAMINOPHEN 7.5-325 MG PO TABS
1.0000 | ORAL_TABLET | Freq: Four times a day (QID) | ORAL | Status: DC | PRN
  Administered 2019-03-08: 1 via ORAL
  Filled 2019-03-08: qty 1

## 2019-03-08 MED FILL — HYDROCODON-APAP 7.5-325: 7.5-325 | 5 days supply | Qty: 20 | Fill #0

## 2019-03-08 NOTE — Care Management (Addendum)
Pt deemed stable for discharge home today.  Pt was on home oxygen PTA 4 liters intermittently.  Pt now requiring 4 liters continuous.  PTA active with [employer] for home oxygen  - agency contacted and informed that pt now has a new order for continuous.    Pt confirms she has her portable tank with her in the hospital room - pt request assistance with transport home - unit to provide cab voucher and provide TOC yellow slip from completed voucher.  Pt denied [place] 360 barriers.  CM informed by attending that IM clinic will arrange outpt neuro PT as recommended.  NO other TOC needs determined - CM signing off

## 2019-03-08 NOTE — Progress Notes (Signed)
SATURATION QUALIFICATIONS: (This note is used to comply with regulatory documentation for home oxygen)  Patient Saturations on Room Air at Rest = 84%  Patient Saturations on Room Air while Ambulating = 84%  Patient Saturations on 4L Liters of oxygen while Ambulating =96%  Please briefly explain why patient needs home oxygen:

## 2019-03-08 NOTE — Progress Notes (Signed)
Subjective:  Patient was seen on rounds this morning She stated that she feels about the same with continued pain in her back. Patient states that prior to her worsening shortness of breath she would only require oxygen intermittently at home. She states she would even be able to walk to the grocery store down the block without oxygen. Since about a week prior to admission she was requiring the oxygen all the time.  We discussed the risks of using opiate pain medications in that they could provide better pain control but would have the risk of worsening her breathing and that while it is a small risk they could be lethal. Patient states that she understands this risk but her pain is so bad that she wants to try taking opiate pain medications. Patient asked about clorazepate medication and counseled that it would be dangerous to take this medication with Norco. Patient states that she has been weaned down on this medication and will stop taking it when she leaves.  Objective:    Vital Signs (last 24 hours): Vitals:   03/08/19 0300 03/08/19 0738 03/08/19 0747 03/08/19 0953  BP:  (!) 123/94    Pulse:  100  83  Resp:  20    Temp:  98.7 F (37.1 C)    TempSrc:  Oral    SpO2:  96% 99% (!) 86%  Weight: (!) 161.1 kg     Height:       Physical Exam: General Alert and answers questions appropriately, no acute distress  Cardiac Regular rate and rhythm, no murmurs, rubs, or gallops  Pulmonary Diminished breath sounds. No wheezes, rhonchi, or rales. Able to breath up to 1500 ml on IS   BMP Latest Ref Rng & Units 03/08/2019 03/07/2019 03/06/2019  Glucose 70 - 99 mg/dL 113(H) 115(H) -  BUN 6 - 20 mg/dL 11 11 -  Creatinine 0.44 - 1.00 mg/dL 0.92 1.11(H) 0.96  BUN/Creat Ratio 9 - 23 - - -  Sodium 135 - 145 mmol/L 144 140 -  Potassium 3.5 - 5.1 mmol/L 3.7 4.3 -  Chloride 98 - 111 mmol/L 100 99 -  CO2 22 - 32 mmol/L 28 29 -  Calcium 8.9 - 10.3 mg/dL 9.0 9.1 -   Assessment/Plan:   Active Problems:  Acute respiratory failure [facility])  Patient is a 49 year old female with past medical history significant for COPD on 4 L intermittently at home, hypertension, and depression who presented on 03/06/2019 with a 7-day history of shortness of breath.   # Acute hypoxic respiratory failure Patient's COPD is GOLD-stage 2 based on PFTs in January 2020 and would not expect degree of dyspnea/oxygen requirement based on this alone. Etiology of patients symptoms is likely multifactorial from hypoventilation 2/2 pain from compression fracture, obstructive sleep apnea, COPD, and body habitus. Patient was trialed on several NSAID pain medications without benefit. Patient is having shallow respirations secondary to significant pain from an acute compression fracture and may benefit from a trial of Norco for pain control. Patient was counseled on risks and desires to proceed with this medication * Will trial Norco for pain control * Outpatient physical therapy * Will need outpatient sleep study * Continue incentive spirometry * Ambulatory pulse oximetry * Consider bariatric surgery as outpatient * Followup with pulmonology  # COPD: *Continue albuterol nebulizers every 4 hours + Dulera 2 puffs twice daily  # Hypertension: Amlodipine 5 mg daily + Valsartan 160 mg daily  # Osteoporosis # Compression fracture: Patient with new diagnosis of  osteoporosis given nontraumatic compression fracture. May benefit from bariatric surgery and bisphosphonate therapy  # Anxiety disorder: Has been on clorazepate for several years and has been on a lowered dose of 7.5 for the past 2 weeks.  Patient has been off this medication since admission, should not to be restarted at discharge  PT/OT: PT recommends outpatient PT, no OT recommendations Diet: Heart healthy DVT Ppx: Lovenox 80 mg daily Dispo: Anticipated discharge in approximately 0-1 days  Jeanmarie Hubert, MD 03/08/2019, 1:43 PM

## 2019-03-09 DIAGNOSIS — J9620 Acute and chronic respiratory failure, unspecified whether with hypoxia or hypercapnia: Secondary | ICD-10-CM | POA: Diagnosis not present

## 2019-03-09 NOTE — Discharge Summary (Signed)
Name: Lindsay [name] MRN: [id number] DOB: 06-17-1970 49 y.o. PCP: Velna Ochs, MD  Date of Admission: 03/06/2019  8:45 AM Date of Discharge: 03/08/2019 Attending Physician: Lalla Brothers, MD FACP  Discharge Diagnosis: 1. Acute hypoxic respiratory failure 2. T3 compression fracture  Discharge Medications: Allergies as of 03/08/2019      Reactions   Lisinopril Rash, Cough      Medication List    STOP taking these medications   Capsaicin 0.075 % Gel   clorazepate 7.5 MG tablet Commonly known as: TRANXENE   escitalopram 10 MG tablet Commonly known as: Lexapro   HYDROcodone-acetaminophen 5-325 MG tablet Commonly known as: Norco Replaced by: HYDROcodone-acetaminophen 7.5-325 MG tablet   iron polysaccharides 150 MG capsule Commonly known as: Nu-Iron   naproxen 500 MG tablet Commonly known as: NAPROSYN     TAKE these medications   acetaminophen 650 MG CR tablet Commonly known as: TYLENOL Take 1,300 mg by mouth every 8 (eight) hours as needed for pain.   albuterol (2.5 MG/3ML) 0.083% nebulizer solution Commonly known as: PROVENTIL Take 3 mLs (2.5 mg total) by nebulization every 4 (four) hours. What changed: Another medication with the same name was changed. Make sure you understand how and when to take each.   albuterol 108 (90 Base) MCG/ACT inhaler Commonly known as: VENTOLIN HFA TAKE 2 PUFFS BY MOUTH EVERY 6 HOURS AS NEEDED FOR WHEEZE OR SHORTNESS OF BREATH What changed: See the new instructions.   amLODipine 10 MG tablet Commonly known as: NORVASC TAKE 1/2 TABLET BY MOUTH EVERY DAY   BC HEADACHE POWDER PO Take 2 Packages by mouth as needed (headache).   BIOTIN PO Take 1 tablet by mouth daily.   Chantix Continuing Month Pak 1 MG tablet Generic drug: varenicline TAKE 1 TABLET BY MOUTH TWICE A DAY What changed: how much to take   cholecalciferol 25 MCG (1000 UNIT) tablet Commonly known as: VITAMIN D Take 1 tablet (1,000 Units total) by mouth daily.   hydrochlorothiazide 25 MG tablet Commonly known as: HYDRODIURIL TAKE 1 TABLET BY MOUTH EVERY DAY   HYDROcodone-acetaminophen 7.5-325 MG tablet Commonly known as: NORCO Take 1 tablet by mouth every 6 (six) hours as needed for up to 5 days for severe pain. Replaces: HYDROcodone-acetaminophen 5-325 MG tablet   hydrOXYzine 50 MG capsule Commonly known as: VISTARIL Take 1 capsule (50 mg total) by mouth at bedtime as needed for anxiety.   lamoTRIgine 25 MG tablet Commonly known as: LaMICtal Take one tab daily for 2 one week and than two tab daily What changed:   how much to take  how to take this  when to take this  additional instructions   omeprazole 40 MG capsule Commonly known as: PRILOSEC Take 1 capsule (40 mg total) by mouth daily.   OXYGEN Inhale 4 L into the lungs.   pregabalin 300 MG capsule Commonly known as: LYRICA Take 1 capsule (300 mg total) by mouth 2 (two) times daily.   PROBIOTIC PO Take 1 capsule by mouth daily.   promethazine 12.5 MG tablet Commonly known as: PHENERGAN TAKE 1 TABLET (12.5 MG TOTAL) BY MOUTH EVERY 6 (SIX) HOURS AS NEEDED FOR NAUSEA OR VOMITING.   Rexulti 3 MG Tabs Generic drug: Brexpiprazole Take 1 tablet (3 mg total) by mouth daily.   Symbicort 160-4.5 MCG/ACT inhaler Generic drug: budesonide-formoterol TAKE 2 PUFFS BY MOUTH TWICE A DAY What changed: See the new instructions.   valsartan 160 MG tablet Commonly known as: DIOVAN Take 1 tablet (160  mg total) by mouth daily.   vitamin B-12 1000 MCG tablet Commonly known as: CYANOCOBALAMIN Take 1 tablet (1,000 mcg total) by mouth daily.   VITAMIN C PO Take 1 tablet by mouth daily.   VITAMIN E PO Take 1 capsule by mouth daily.            Durable Medical Equipment  (From admission, onward)         Start     Ordered   03/08/19 0000  For home use only DME oxygen    Question Answer Comment  Length of Need 12 Months   Mode or (Route) Nasal cannula   Liters per  Minute 4   Frequency Continuous (stationary and portable oxygen unit needed)   Oxygen delivery system Gas      03/08/19 1538          Disposition and follow-up:   [name] was discharged from [facility] in Stable condition.  At the hospital follow up visit please address:  * Please discuss if patient's pain is controlled with Norco. Patient was provided with 5 day supply, please consider prescribing this for continued use. See discussion in problem below. * Please consider referral for sleep study and referral for bariatric surgery * Please assess if patient has been setup for outpatient PT with neuro rehab. Referral was placed at discharge.  2.  Labs / imaging needed at time of follow-up: None  3.  Pending labs/ test needing follow-up: None  Follow-up Appointments: Follow-up Information    Velna Ochs, MD. Go on 03/12/2019.   Specialty: Internal Medicine Why: You have an appointment scheduled for this Friday 03/12/2019 at 10:45 am. If this time does not work, please call to reschedule Contact information: [place] [address] [phone]        Pixie Casino, MD .   Specialty: Cardiology Contact information: [place] [address] [phone]           Hospital Course by problem list:  # Acute hypoxic respiratory failure # COPD # T3 Compression Fracture Patient presented on 03/06/2019 with a 7-day history of worsening shortness of breath and chest pain.  Patient had normal troponin and EKG without signs of acute ischemia.  Patient does have COPD but was without signs/symptoms of COPD exacerbation.  Patient had history suggestive of heart failure exacerbation, but patient was without signs of volume overload on exam.  Given pleuritic nature of patient's pain, CT angiogram was performed which was negative for pulmonary embolism.  This study did demonstrate enlarged pulmonary arteries but repeat  TTE was without evidence for pulmonary arterial hypertension.    Patient COPD is Gold stage II based on PFTs from January 2020 and we would not expect patient's degree of oxygen requirement based on this alone.  Etiology of patient's symptoms is likely multifactorial from hypoventilation secondary to pain from compression fracture (detected on CT Angiogram), obstructive sleep apnea, COPD, and body habitus. Patient was trialed on several NSAID pain medications without benefit.  Patient was having shallow respirations secondary to significant pain from the compression fracture.  The risks and benefits of narcotic pain medications was discussed with patient and we decided to trial patient on Norco.  Following administration, patient had severe improvement in symptoms and was discharged with 5-day course with outpatient follow-up. Patient will likely benefit from continued pain control with Norco while she is with pain from this compression fracture. She would additionally benefit from  outpatient physical therapy (referral for neuro rehab placed), a sleep study, continued incentive spirometry, and consideration of bariatric surgery.  Discharge Vitals:   BP (!) 123/94 (BP Location: Right Arm)   Pulse 83   Temp 98.7 F (37.1 C) (Oral)   Resp 20   Ht 5\' 11"  (1.803 m)   Wt (!) 161.1 kg   LMP 02/26/2019   SpO2 (!) 86%   BMI 49.53 kg/m   Pertinent Labs, Studies, and Procedures:   CTA Chest PE W OR WO Contrast (03/06/19): IMPRESSION: 1. Negative for acute pulmonary embolus, pneumonia or other acute cardiopulmonary process. 2. Enlarged main and central pulmonary arteries consistent with pulmonary arterial hypertension. 3. Stable cardiomegaly. 4. Scattered areas of dependent atelectasis. 5. Age indeterminate compression fracture of the superior endplate of T3 is a new finding compared to November of 2019.  CBC Latest Ref Rng & Units 03/07/2019 03/06/2019 03/06/2019  WBC 4.0 - 10.5 K/uL 10.0 7.8 6.9    Hemoglobin 12.0 - 15.0 g/dL 14.9 14.8 15.0  Hematocrit 36.0 - 46.0 % 48.3(H) 47.9(H) 48.4(H)  Platelets 150 - 400 K/uL 499(H) 469(H) 500(H)   BMP Latest Ref Rng & Units 03/08/2019 03/07/2019 03/06/2019  Glucose 70 - 99 mg/dL 113(H) 115(H) -  BUN 6 - 20 mg/dL 11 11 -  Creatinine 0.44 - 1.00 mg/dL 0.92 1.11(H) 0.96  BUN/Creat Ratio 9 - 23 - - -  Sodium 135 - 145 mmol/L 144 140 -  Potassium 3.5 - 5.1 mmol/L 3.7 4.3 -  Chloride 98 - 111 mmol/L 100 99 -  CO2 22 - 32 mmol/L 28 29 -  Calcium 8.9 - 10.3 mg/dL 9.0 9.1 -    Discharge Instructions: Discharge Instructions    Ambulatory Referral to Neuro Rehab   Complete by: As directed    Call MD for:  difficulty breathing, headache or visual disturbances   Complete by: As directed    Call MD for:  extreme fatigue   Complete by: As directed    Call MD for:  persistant dizziness or light-headedness   Complete by: As directed    Call MD for:  severe uncontrolled pain   Complete by: As directed    Discharge instructions   Complete by: As directed    You were seen in the hospital for back pain and difficulty breathing. We have provided you with a short supply of Norco for pain relief. Each one of these tablets contains 325 mg of acetaminophen so please do not take with other medications containing acetaminophen. Your total daily amount of acetaminophen should not exceed 3,000 mg and you should not take more than 1 Norco tablet every 6 hours. At your clinic followup they can discuss further pain management. Thank you for allowing [name] to be part of your medical care!   For home use only DME oxygen   Complete by: As directed    Length of Need: 12 Months   Mode or (Route): Nasal cannula   Liters per Minute: 4   Frequency: Continuous (stationary and portable oxygen unit needed)   Oxygen delivery system: Gas   Increase activity slowly   Complete by: As directed    Increase activity slowly   Complete by: As directed       Signed: Jeanmarie Hubert,  MD 03/09/2019, 7:47 AM   Pager: [phone]

## 2019-03-11 ENCOUNTER — Other Ambulatory Visit: Payer: Self-pay

## 2019-03-11 MED ORDER — PROMETHAZINE HCL 12.5 MG PO TABS: 12.5000 mg | ORAL_TABLET | Freq: Four times a day (QID) | ORAL | 6 refills | Status: DC | PRN

## 2019-03-12 ENCOUNTER — Ambulatory Visit (INDEPENDENT_AMBULATORY_CARE_PROVIDER_SITE_OTHER): Payer: Medicare Other | Admitting: Licensed Clinical Social Worker

## 2019-03-12 ENCOUNTER — Ambulatory Visit: Payer: Medicare Other

## 2019-03-12 ENCOUNTER — Other Ambulatory Visit: Payer: Self-pay

## 2019-03-12 DIAGNOSIS — F331 Major depressive disorder, recurrent, moderate: Secondary | ICD-10-CM

## 2019-03-12 DIAGNOSIS — F411 Generalized anxiety disorder: Secondary | ICD-10-CM

## 2019-03-12 NOTE — Progress Notes (Signed)
Virtual Visit via Telephone Note  I connected with Lindsay [name] on 03/12/19 at  9:00 AM EST by telephone and verified that I am speaking with the correct person using two identifiers.   I discussed the limitations, risks, security and privacy concerns of performing an evaluation and management service by telephone and the availability of in person appointments. I also discussed with the patient that there may be a patient responsible charge related to this service. The patient expressed understanding and agreed to proceed.   I discussed the assessment and treatment plan with the patient. The patient was provided an opportunity to ask questions and all were answered. The patient agreed with the plan and demonstrated an understanding of the instructions.   The patient was advised to call back or seek an in-person evaluation if the symptoms worsen or if the condition fails to improve as anticipated.  I provided 55 minutes of non-face-to-face time during this encounter.   THERAPIST PROGRESS NOTE  Session Time: 9:02 AM to 9:57 AM  Participation Level: Active  Behavioral Response: CasualAlertAnxious and Dysphoric  Type of Therapy: Individual Therapy  Treatment Goals addressed: Anxiety,Depression, self-esteem, coping  Interventions: Solution Focused, Strength-based, Supportive and Other: coping  Summary: Lindsay [name] is a 49 y.o. female who presents update from last session when she was not feeling well and went to hospital. Shares that she found out she has a fracture on back. Relates that she fell last Memorial day. Her feet were swollen, couldn't breath, was told that her heart not functioning right. Her arteries around the heart have enlarged. Shares breathing in has hurt probably because of fracture. Her breaths have been shallow because hurts her back.. She was given a  device to expand lungs and uses this during the day. Medical issues have made scoliosis worse. Seeing PCP Monday.   Feeling better, tried to cook herself something, took everything out of her and then didn't feel hungry.   Patient shares they are talking about doing a gastric sleeve, needs to find out more about that. Shared with therapist put on Lexapro and notices that gets angry easily.  Was put on that once Dr. Marcille Buffy out was using marijuana. Will need to review with doctor. Reviewed session and patient feels that she "got a lot off my chest". Suicidal/Homicidal: No  Therapist Response: Therapist reviewed symptoms, discussed stressors, facilitated expression of thoughts and feelings.  Significant issues with medical problems so therapist provided supportive interventions, facilitated patient talking about struggles in the hospital, discussed effective coping to manage her medical issues encourage patient with making that a priority at this point, provided strength based intervention.  Plan: Return again in 1 week.2.  Therapist work with patient on decrease in anxiety and depression, coping with stressors. 3.  Therapist reviewed resources provided to patient for discounted furniture  Diagnosis: Axis I:  Major depressive disorder, recurrent, moderate, generalized anxiety disorder    Axis II: No diagnosis    Lindsay Register, LCSW 03/12/2019

## 2019-03-15 ENCOUNTER — Ambulatory Visit (INDEPENDENT_AMBULATORY_CARE_PROVIDER_SITE_OTHER): Payer: Medicare Other | Admitting: Internal Medicine

## 2019-03-15 ENCOUNTER — Other Ambulatory Visit: Payer: Self-pay | Admitting: *Deleted

## 2019-03-15 ENCOUNTER — Other Ambulatory Visit: Payer: Self-pay

## 2019-03-15 VITALS — BP 162/94 | HR 102 | Temp 98.9°F | Ht 71.0 in | Wt 358.2 lb

## 2019-03-15 DIAGNOSIS — I10 Essential (primary) hypertension: Secondary | ICD-10-CM | POA: Diagnosis not present

## 2019-03-15 DIAGNOSIS — J9611 Chronic respiratory failure with hypoxia: Secondary | ICD-10-CM

## 2019-03-15 DIAGNOSIS — B3731 Acute candidiasis of vulva and vagina: Secondary | ICD-10-CM

## 2019-03-15 DIAGNOSIS — M4854XA Collapsed vertebra, not elsewhere classified, thoracic region, initial encounter for fracture: Secondary | ICD-10-CM | POA: Diagnosis not present

## 2019-03-15 DIAGNOSIS — B373 Candidiasis of vulva and vagina: Secondary | ICD-10-CM | POA: Diagnosis not present

## 2019-03-15 DIAGNOSIS — K068 Other specified disorders of gingiva and edentulous alveolar ridge: Secondary | ICD-10-CM | POA: Diagnosis not present

## 2019-03-15 DIAGNOSIS — S22000A Wedge compression fracture of unspecified thoracic vertebra, initial encounter for closed fracture: Secondary | ICD-10-CM

## 2019-03-15 DIAGNOSIS — J9620 Acute and chronic respiratory failure, unspecified whether with hypoxia or hypercapnia: Secondary | ICD-10-CM | POA: Diagnosis not present

## 2019-03-15 DIAGNOSIS — Z9981 Dependence on supplemental oxygen: Secondary | ICD-10-CM

## 2019-03-15 DIAGNOSIS — Z79891 Long term (current) use of opiate analgesic: Secondary | ICD-10-CM

## 2019-03-15 MED ORDER — FLUCONAZOLE 150 MG PO TABS: 150.0000 mg | ORAL_TABLET | Freq: Every day | ORAL | 0 refills | Status: DC

## 2019-03-15 MED ORDER — LIDOCAINE 5 % EX OINT: 1.0000 "application " | TOPICAL_OINTMENT | CUTANEOUS | 0 refills | Status: DC | PRN

## 2019-03-15 MED ORDER — HYDROCODONE-ACETAMINOPHEN 7.5-325 MG PO TABS: 1.0000 | ORAL_TABLET | Freq: Four times a day (QID) | ORAL | 0 refills | Status: DC | PRN

## 2019-03-15 NOTE — Assessment & Plan Note (Addendum)
Ms. Heinert was recently hospitalized and CTA for PE rule out was notable for an age indeterminate T3 compression fracture of the superior endplate. She was discharged with 5 day supply of Norco for pain management after pain was not adequately relieved by NSAIDs. She was also referred to Neuro rehab.   Today, patient states she continues to have upper back pain but it is worst in the right shoulder.  No radiation into her arm, no paresthesia.  She describes the pain as sharp.  The pain has kept her from being able to turn her head towards the right or have full range of motion of her right arm.  She has taken the Norco twice daily since discharge and finds that it helps ease some of the pain.  On examination, she has active and passive pain with range of motion and is quite tender to palpation in the anterior chest along collarbone radiating into the right shoulder.  This is not consistent with the location of her fracture and may be musculoskeletal.  Possibly concerning for frozen shoulder, which may have stemmed from the original pain of the T3 fracture.   Will work on neuro rehab referral initially ordered while admitted. In the meantime we will refill Norco for short-term relief of severe pain, as well as lidocaine 5% cream.  Plan:  - Neuro rehab referral placed and scheduled today - Norco 7.5-325 mg q6h PRN, #10 tablets 0 refills  - Lidocaine 5% cream

## 2019-03-15 NOTE — Progress Notes (Signed)
   CC: chronic hypoxic respiratory failure, T3 compression fracture  HPI:  [name] is a 49 y.o. with a PMHx of chronic hypoxic respiratory failure, HTN who presents to the clinic for chronic hypoxic respiratory failure, T3 compression fracture, and hospital follow up.   [name] endorses pain in her gums on the right that has been occurring since discharge from hospital last week. She feels that the area is swollen but denies redness, fever, chills. She feels this is likely related to her teeth, as she has not been able to follow up with the denture clinic to have her teeth pulled.   Please see the Encounters tab for problem-based Assessment & Plan regarding status of patient's chronic conditions.  Past Medical History:  Diagnosis Date  . Acid reflux   . Anemia    Iron Def  . Anorexia   . Chronic kidney disease    Nephrotic syndrome  . Colon polyp 2009  . Depression with anxiety   . Edema leg   . Fibromyalgia   . Hemorrhoids   . Hidradenitis suppurativa   . Hypertension   . IBS (irritable bowel syndrome)   . Low back pain   . Migraines   . Morbidly obese ([place])   . Neuromuscular disorder (HCC)    fibromyalgia  . Neuropathy   . Panic attacks   . Polyp of vocal cord or larynx   . Tonsil pain    Review of Systems: Review of Systems  Constitutional: Negative for chills, fever and weight loss.  Respiratory: Positive for shortness of breath. Negative for cough.   Cardiovascular: Positive for chest pain.  Gastrointestinal: Negative for abdominal pain, diarrhea, nausea and vomiting.  Genitourinary:       Vaginal burning, discharge (white)  Musculoskeletal: Positive for back pain, myalgias and neck pain. Negative for falls.  Neurological: Negative for focal weakness.   Physical Exam:  Vitals:   03/15/19 0912  Weight: (!) 358 lb 3.2 oz (162.5 kg)  Height: 5\' 11"  (1.803 m)   Physical Exam Vitals and nursing note reviewed.  Constitutional:      General: She is not  in acute distress.    Appearance: She is obese.  Pulmonary:     Effort: No tachypnea, accessory muscle usage or respiratory distress.     Breath sounds: No decreased breath sounds, wheezing, rhonchi or rales.  Musculoskeletal:     Right shoulder: Tenderness (diffusely TTP on the anterior aspect along collarbone with extension to the shoulder, upper back) present. No swelling, deformity or effusion. Decreased range of motion (2/2 pain). Normal strength.  Skin:    General: Skin is warm and dry.  Neurological:     General: No focal deficit present.     Mental Status: She is alert and oriented to person, place, and time. Mental status is at baseline.     Motor: No weakness.     Gait: Gait normal.  Psychiatric:        Mood and Affect: Mood normal.        Behavior: Behavior normal.    Assessment & Plan:   See Encounters Tab for problem based charting.  Patient discussed with Dr. Philipp Ovens

## 2019-03-15 NOTE — Assessment & Plan Note (Signed)
Lindsay [name] endorses continued vaginal burning with some white chunky discharge.  She followed up regarding this in January and states her Diflucan was never ordered.  She is requesting it be sent in today.  Plan:  - Diflucan 150 mg, 2 tablets with instructions to take 1 tablet on the first day and 2nd tablet on day 3

## 2019-03-15 NOTE — Assessment & Plan Note (Signed)
Ms. Gensel was recently hospitalized and [facility] on 03/06/2019 for acute on chronic hypoxic respiratory failure.  It was felt to be multifactorial, particularly including recent compression fracture that was making it difficult to take deep breaths.  She was discharged on Norco to alleviate this.  Today, Ms. Ricketson states that her shortness of breath is at baseline.  She feels particularly most short of breath after exerting herself and is usually alleviated by wearing her oxygen.  She is not requiring oxygen at all times when walking just with long distances.  Of note, in the clinic when she walked to the bathroom and returned she endorsed feeling short of breath, however her O2 saturation was 95%.  Ms. Malki has previously followed up with pulmonology back in July 2020.  A repeat sleep study was recommended at that time to evaluate for possibly worsening OSA.  Patient was unaware the referral was placed.  Appointment was able to be made today.  Plan: -4L supplemental oxygen as needed -Follow-up with pulmonology -Sleep study scheduled

## 2019-03-15 NOTE — Assessment & Plan Note (Addendum)
No evidence of acute infection or abscess at this time. Recommended she follow up with her dentist and treat pain symptomatically with Orajel   Plan:  - [place]  - Follow up with dentist

## 2019-03-15 NOTE — Patient Instructions (Addendum)
It was nice seeing you today! Thank you for choosing [facility] for your Primary Care.    Today we talked about:   1. Shortness of Breathe: Please contact your lung doctor to schedule a follow up. In addition, the sleep study will be very important to determine what treatments you may need.   2. Back Pain: I refilled the Norco. Please use only when pain is severe. I also sent in a prescription for Lidocaine 5%.   3. Gum Pain: Please follow up with your dentist. For pain relief, Orajel may be helpful.   4. Yeast Infection: Take 1 tablet of Diflucan on Day #1 and then another tablet 2 days later (Day #3)

## 2019-03-15 NOTE — Patient Outreach (Signed)
[facility]) Care Management  03/15/2019  Azra Pantel 08-02-1970 [id number]    EMMI-GENERAL DISCHARGE RED ON EMMI ALERT Day #1 Date: 03/10/2019 Red Alert Reason: Not read d/c paperwork, transportation issues, no wounds present  Outreach#1 RN spoke with pt who indicated all above emmi issues have been resolved. States she has read all discharge paperwork and discontinued all medication noted, Reports follow up with her provider with [facility] transport services and she has no wounds at this time.   Plan: RN inquired on any other needs as pt states none at this time. Case will be closed.  Raina Mina, RN Care Management Coordinator [place] [facility] [phone]

## 2019-03-17 ENCOUNTER — Other Ambulatory Visit: Payer: Self-pay | Admitting: Pulmonary Disease

## 2019-03-17 NOTE — Progress Notes (Signed)
Internal Medicine Clinic Attending  Case discussed with Dr. Basaraba at the time of the visit.  We reviewed the resident's history and exam and pertinent patient test results.  I agree with the assessment, diagnosis, and plan of care documented in the resident's note.    

## 2019-03-17 NOTE — Telephone Encounter (Signed)
Dr. Jenetta Downer, please advise if it is okay to refill med for pt.

## 2019-03-18 ENCOUNTER — Telehealth: Payer: Self-pay | Admitting: Internal Medicine

## 2019-03-18 ENCOUNTER — Ambulatory Visit (INDEPENDENT_AMBULATORY_CARE_PROVIDER_SITE_OTHER): Payer: Medicare Other | Admitting: Psychiatry

## 2019-03-18 ENCOUNTER — Encounter (HOSPITAL_COMMUNITY): Payer: Self-pay | Admitting: Psychiatry

## 2019-03-18 ENCOUNTER — Other Ambulatory Visit: Payer: Self-pay

## 2019-03-18 DIAGNOSIS — F411 Generalized anxiety disorder: Secondary | ICD-10-CM | POA: Diagnosis not present

## 2019-03-18 DIAGNOSIS — F4312 Post-traumatic stress disorder, chronic: Secondary | ICD-10-CM

## 2019-03-18 DIAGNOSIS — F331 Major depressive disorder, recurrent, moderate: Secondary | ICD-10-CM | POA: Diagnosis not present

## 2019-03-18 MED ORDER — HYDROCODONE-ACETAMINOPHEN 7.5-325 MG PO TABS: 1.0000 | ORAL_TABLET | Freq: Three times a day (TID) | ORAL | 0 refills | Status: DC | PRN

## 2019-03-18 MED ORDER — REXULTI 3 MG PO TABS: 3.0000 mg | ORAL_TABLET | Freq: Every day | ORAL | 1 refills | Status: DC

## 2019-03-18 MED ORDER — LAMOTRIGINE 25 MG PO TABS: ORAL_TABLET | ORAL | 1 refills | Status: DC

## 2019-03-18 MED ORDER — LIDOCAINE 5 % EX OINT: 1.0000 "application " | TOPICAL_OINTMENT | CUTANEOUS | 0 refills | Status: DC | PRN

## 2019-03-18 NOTE — Addendum Note (Signed)
Addended by: Lars Mage on: 03/18/2019 01:14 PM   Modules accepted: Orders

## 2019-03-18 NOTE — Telephone Encounter (Addendum)
   Reason for call:   I received a call from [name] at 11:36 AM indicating that she is in her lower back and right shoulders.   Pertinent Data:   States that she was supposed to see Dr. Jacelyn Grip at [facility] 03/19/19 to discuss how to control pain, but his office is closed till 03/22/19. Was last seen by [facility] on 03/15/19 at which time referred to neuro rehab, lidocaine cream, and given #10 tablets of norco 7.5-325mg  q6hrs prn. Patient states she has only been taking 3 tablets per day and is almost out.    CTA chest pe w or wo contrast 03/06/19 showed a compression fracture of superior endplate of t3 which was new since nov 2019.     Assessment / Plan / Recommendations:   Refilled Norco 7.5-325mg  q8hrs prn #10 till 2/22 and lidocaine ointment so that pain is controlled till she can follow up with Dr. Jacelyn Grip.   Reviewed pdmp to make sure no inappropriate opiate use  As always, pt is advised that if symptoms worsen or new symptoms arise, they should go to an urgent care facility or to to ER for further evaluation.   Lars Mage, MD   03/18/2019, 11:41 AM
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
## 2019-03-19 ENCOUNTER — Ambulatory Visit (INDEPENDENT_AMBULATORY_CARE_PROVIDER_SITE_OTHER): Payer: Medicare Other | Admitting: Licensed Clinical Social Worker

## 2019-03-19 DIAGNOSIS — F331 Major depressive disorder, recurrent, moderate: Secondary | ICD-10-CM

## 2019-03-19 DIAGNOSIS — F411 Generalized anxiety disorder: Secondary | ICD-10-CM | POA: Diagnosis not present

## 2019-03-19 NOTE — Progress Notes (Signed)
Virtual Visit via Telephone Note  I connected with Lindsay [name] on 03/19/19 at  8:00 AM EST by telephone and verified that I am speaking with the correct person using two identifiers.   I discussed the limitations, risks, security and privacy concerns of performing an evaluation and management service by telephone and the availability of in person appointments. I also discussed with the patient that there may be a patient responsible charge related to this service. The patient expressed understanding and agreed to proceed.  I discussed the assessment and treatment plan with the patient. The patient was provided an opportunity to ask questions and all were answered. The patient agreed with the plan and demonstrated an understanding of the instructions.   The patient was advised to call back or seek an in-person evaluation if the symptoms worsen or if the condition fails to improve as anticipated.  I provided 55 minutes of non-face-to-face time during this encounter.   THERAPIST PROGRESS NOTE  Session Time: 8:01 AM to 8:56 AM  Participation Level: Active  Behavioral Response: CasualAlertAnxious and Dysphoric  Type of Therapy: Individual Therapy  Treatment Goals addressed:  Anxiety,Depression, self-esteem, coping, help patient to cope with health issues  Interventions: CBT, Solution Focused, Strength-based, Supportive and Other: coping  Summary: Lindsay [name] is a 49 y.o. female who presents with in pain supposed to get MRI. Reviewed sources of pain and that includes broke back and scoliosis. "Collar bone is broken" shoulder hurting, slid on kitchen floor and caught herself. Reach out it hurts or hand to hair and it hurts. Reviewed what the doctor's are recommending. Needs to get a test for sleep apnea, needs to see pulmonary doctor. Has Narco. See what the MRI says. Patient feels overwhelmed and feels bad about health wants to feel better. Worried about health. Feels like should have pushed  back and said "enough is enough". Want to tell people they need to do more. She has had respiratory failure for awhile pneumonia triggered it. Patient is frustrated with situation. Wants to reach out to head doctor at hospital to do more to help with her physical issues. Feels depressed about the health issues. Financial stressors and can't get a temporary job because of being out of breath.  Reviewed treatment goals and main goal for patient is helping her cope with health issues, as well as addressing anxiety.  Patient reviewed session and felt there was enough time in session for her to be able to tell  therapist what I wanted to say.       Suicidal/Homicidal: No  Therapist Response: Therapist reviewed symptoms, discussed stressors, facilitated expression of thoughts and feelings, completed treatment plan and patient gave verbal consent to complete virtually.  Utilize interventions to help with patient's anxiety, coping mechanisms that will help her deal with stress of physical health.  Encourage patient with cognitive messages that were realistic and positive related to health also encouraging patient's own strategy of speaking of herself with doctors to make sure her needs meet. In this way encouraging patient's coping strategy of being assertive that helps to empower her, utilizing her own strengths and resources.  Discussed strategies to realistically address worries and concerns.  Therapist provided strength based and supportive interventions.  Plan: Return again in 2 weeks.2.  Therapist work with patient on coping with health issues, depression, anxiety, coping  Diagnosis: Axis I:  Major depressive disorder, recurrent, moderate, generalized anxiety disorder    Axis II: No diagnosis    [name], LCSW 03/19/2019

## 2019-03-23 ENCOUNTER — Other Ambulatory Visit (HOSPITAL_COMMUNITY): Payer: Self-pay | Admitting: Psychiatry

## 2019-03-23 DIAGNOSIS — F411 Generalized anxiety disorder: Secondary | ICD-10-CM

## 2019-03-24 ENCOUNTER — Other Ambulatory Visit (HOSPITAL_COMMUNITY)
Admission: RE | Admit: 2019-03-24 | Discharge: 2019-03-24 | Disposition: A | Discharge status: Home / Self Care | Payer: Medicare Other | Source: Ambulatory Visit | Attending: Pulmonary Disease | Admitting: Pulmonary Disease

## 2019-03-24 DIAGNOSIS — Z20822 Contact with and (suspected) exposure to covid-19: Secondary | ICD-10-CM | POA: Insufficient documentation

## 2019-03-24 DIAGNOSIS — Z01812 Encounter for preprocedural laboratory examination: Secondary | ICD-10-CM | POA: Diagnosis not present

## 2019-03-24 LAB — SARS CORONAVIRUS 2 (TAT 6-24 HRS): SARS Coronavirus 2: NEGATIVE

## 2019-03-26 ENCOUNTER — Ambulatory Visit (HOSPITAL_BASED_OUTPATIENT_CLINIC_OR_DEPARTMENT_OTHER): Payer: Medicare Other | Attending: Pulmonary Disease | Admitting: Pulmonary Disease

## 2019-03-26 ENCOUNTER — Other Ambulatory Visit: Payer: Self-pay

## 2019-03-26 DIAGNOSIS — R0902 Hypoxemia: Secondary | ICD-10-CM | POA: Insufficient documentation

## 2019-03-26 DIAGNOSIS — I493 Ventricular premature depolarization: Secondary | ICD-10-CM | POA: Diagnosis not present

## 2019-03-26 DIAGNOSIS — G4733 Obstructive sleep apnea (adult) (pediatric): Secondary | ICD-10-CM | POA: Insufficient documentation

## 2019-03-26 DIAGNOSIS — J449 Chronic obstructive pulmonary disease, unspecified: Secondary | ICD-10-CM | POA: Insufficient documentation

## 2019-03-28 DIAGNOSIS — J9620 Acute and chronic respiratory failure, unspecified whether with hypoxia or hypercapnia: Secondary | ICD-10-CM | POA: Diagnosis not present

## 2019-03-29 ENCOUNTER — Other Ambulatory Visit: Payer: Self-pay | Admitting: Pulmonary Disease

## 2019-03-29 DIAGNOSIS — R059 Cough, unspecified: Secondary | ICD-10-CM

## 2019-03-29 DIAGNOSIS — R05 Cough: Secondary | ICD-10-CM

## 2019-03-30 ENCOUNTER — Other Ambulatory Visit: Payer: Self-pay

## 2019-03-30 ENCOUNTER — Ambulatory Visit: Payer: Medicare Other

## 2019-03-30 ENCOUNTER — Ambulatory Visit (HOSPITAL_COMMUNITY): Payer: Medicare Other | Admitting: Licensed Clinical Social Worker

## 2019-03-30 DIAGNOSIS — F411 Generalized anxiety disorder: Secondary | ICD-10-CM

## 2019-03-30 DIAGNOSIS — M47816 Spondylosis without myelopathy or radiculopathy, lumbar region: Secondary | ICD-10-CM | POA: Diagnosis not present

## 2019-03-30 DIAGNOSIS — F331 Major depressive disorder, recurrent, moderate: Secondary | ICD-10-CM

## 2019-03-30 NOTE — Telephone Encounter (Signed)
Refill for hydrocodone declined. This was meant to be a short term prescription after her hospitalization. She had two refills since then. Thanks.

## 2019-03-30 NOTE — Telephone Encounter (Signed)
HYDROcodone-acetaminophen (NORCO) 7.5-325 MG tablet(Expired, refill request @  [id number] [address], [place] [phone] (Phone) [phone] (Fax)

## 2019-03-30 NOTE — Telephone Encounter (Signed)
Called pt, she states the ortho dr was going to do injections in her back for pain but their table is too small for her weight so it is postponed and she has 1 pain pill left, she is advised to call ortho and request short course of pain med, she is agreeable

## 2019-03-30 NOTE — Telephone Encounter (Signed)
Last rx written 03/18/19. Last OV  1/13with PCP. Next OV  05/19/19. UDS none

## 2019-03-30 NOTE — Progress Notes (Signed)
Virtual Visit via Telephone Note  I connected with [name] on 03/30/19 at  8:00 AM EST by telephone and verified that I am speaking with the correct person using two identifiers.   I discussed the limitations, risks, security and privacy concerns of performing an evaluation and management service by telephone and the availability of in person appointments. I also discussed with the patient that there may be a patient responsible charge related to this service. The patient expressed understanding and agreed to proceed.   I discussed the assessment and treatment plan with the patient. The patient was provided an opportunity to ask questions and all were answered. The patient agreed with the plan and demonstrated an understanding of the instructions.   The patient was advised to call back or seek an in-person evaluation if the symptoms worsen or if the condition fails to improve as anticipated.  I provided 10 minutes of non-face-to-face time during this encounter.  THERAPIST PROGRESS NOTE  Session Time: 8:00 AM to 8:10 AM  Participation Level: Active  Behavioral Response: CasualAlertAnxious  Type of Therapy: Individual Therapy  Treatment Goals addressed: help cope with health issues, depression, stress management  Interventions: Solution Focused, Strength-based, Supportive and Reframing  Summary: Tassia Meder is a 49 y.o. female who presents with relating that she is waiting to get results for MRI. Therapist pointed out things seem to be moving forward as last session patient was talking about getting her MRI done. She relates "slowly". Describes has a lot of appointments this month, feeling overwhelmed because does not want to overbook appointments like today where she also had a therapy appointment. She realizes mental health is important. Had sleep study last Friday. Therapist will review outcome of tests and health status with patient at next session, help her address anxiety and  coping strategies for managing health issues. Assessed patient actively working on treatment goals as she is working on getting treatment for health issues at the same time utilizing treatment to manage anxiety and health issues.   Suicidal/Homicidal: No  Plan: Return again in 1 week.2.Stress management, anxiety.  Diagnosis: Axis I:  Major depressive disorder, recurrent, moderate, generalized anxiety disorder    Axis II: No diagnosis    [name], LCSW 03/30/2019

## 2019-04-01 ENCOUNTER — Other Ambulatory Visit: Payer: Self-pay

## 2019-04-01 ENCOUNTER — Other Ambulatory Visit: Payer: Self-pay | Admitting: Internal Medicine

## 2019-04-01 ENCOUNTER — Ambulatory Visit (INDEPENDENT_AMBULATORY_CARE_PROVIDER_SITE_OTHER): Payer: Medicare Other | Admitting: Internal Medicine

## 2019-04-01 DIAGNOSIS — X58XXXA Exposure to other specified factors, initial encounter: Secondary | ICD-10-CM | POA: Diagnosis not present

## 2019-04-01 DIAGNOSIS — M797 Fibromyalgia: Secondary | ICD-10-CM

## 2019-04-01 DIAGNOSIS — S22000A Wedge compression fracture of unspecified thoracic vertebra, initial encounter for closed fracture: Secondary | ICD-10-CM

## 2019-04-01 MED ORDER — LIDOCAINE 5 % EX OINT: 1.0000 "application " | TOPICAL_OINTMENT | CUTANEOUS | 0 refills | Status: DC | PRN

## 2019-04-01 MED ORDER — HYDROCODONE-ACETAMINOPHEN 7.5-325 MG PO TABS: 1.0000 | ORAL_TABLET | Freq: Three times a day (TID) | ORAL | 0 refills | Status: DC | PRN

## 2019-04-01 NOTE — Progress Notes (Signed)
Internal Medicine Clinic Attending  Case discussed with Dr. MacLean at the time of the visit.  We reviewed the resident's history and exam and pertinent patient test results.  I agree with the assessment, diagnosis, and plan of care documented in the resident's note.    

## 2019-04-01 NOTE — Assessment & Plan Note (Signed)
Patient with T3 compression fraction, age-indeterminate detected on hospitalization on 03/06/2019.  Patient reports continued pain as before from this compression fracture.  Pain is in her back, right shoulder, neck, does not radiate down her arms, is not associated with weakness, or changes in sensation.  Pain worse when looking to the right, not significantly worse with neck extension.  Patient states she is able to ambulate around the grocery store without using oxygen.  Patient states she uses her incentive spirometry every day.  Patient has appointment with orthopedics on March 25th (Dr. Jacelyn Grip) who plans for a joint injection.  Patient also has neuro rehab scheduled, evaluation on 04/06/2019.  Plan: *Lidocaine PRN for neck pain *Norco 7.5-325 mg, 15 tablets for back pain *Joint injection per orthopedics on March 25 *Neuro rehab discharged on 04/06/2019 *If patient is with continued symptoms despite rehab, joint injection, patient would benefit from in person encounter.

## 2019-04-01 NOTE — Progress Notes (Signed)
  [facility] Internal Medicine Residency Telephone Encounter Continuity Care Appointment  HPI:   This telephone encounter was created for [name] on 04/01/2019 for the following purpose/cc back pain.   Past Medical History:  Past Medical History:  Diagnosis Date  . Acid reflux   . Anemia    Iron Def  . Anorexia   . Chronic kidney disease    Nephrotic syndrome  . Colon polyp 2009  . Depression with anxiety   . Edema leg   . Fibromyalgia   . Hemorrhoids   . Hidradenitis suppurativa   . Hypertension   . IBS (irritable bowel syndrome)   . Low back pain   . Migraines   . Morbidly obese ([place])   . Neuromuscular disorder (HCC)    fibromyalgia  . Neuropathy   . Panic attacks   . Polyp of vocal cord or larynx   . Tonsil pain       ROS:  Review of Systems  HENT: Negative for congestion.   Respiratory: Negative for cough and shortness of breath.   Cardiovascular: Negative for chest pain.  Musculoskeletal: Positive for back pain and neck pain.  All other systems reviewed and are negative.    Assessment / Plan / Recommendations:   Please see A&P under problem oriented charting for assessment of the patient's acute and chronic medical conditions.   As always, pt is advised that if symptoms worsen or new symptoms arise, they should go to an urgent care facility or to to ER for further evaluation.   Consent and Medical Decision Making:   Patient discussed with Dr. Dareen Piano  This is a telephone encounter between Lindsay [name] and Lindsay [name] on 04/01/2019 for discussion of back pain. The visit was conducted with the patient located at home and Lindsay [name] at [facility]. The patient's identity was confirmed using their DOB and current address. The patient has consented to being evaluated through a telephone encounter and understands the associated risks (an examination cannot be done and the patient may need to come in for an appointment) / benefits (allows the patient to  remain at home, decreasing exposure to coronavirus). I personally spent 20 minutes on medical discussion.

## 2019-04-02 ENCOUNTER — Ambulatory Visit: Payer: Medicare Other

## 2019-04-02 ENCOUNTER — Other Ambulatory Visit (HOSPITAL_COMMUNITY): Payer: Self-pay | Admitting: Psychiatry

## 2019-04-02 DIAGNOSIS — F331 Major depressive disorder, recurrent, moderate: Secondary | ICD-10-CM

## 2019-04-05 ENCOUNTER — Telehealth: Payer: Self-pay | Admitting: Pulmonary Disease

## 2019-04-05 NOTE — Procedures (Signed)
POLYSOMNOGRAPHY  Last, First: Lindsay, [name] MRN: [id number] Gender: Female Age (years): 49 Weight (lbs): 368 DOB: [birth year] BMI: 53 Primary Care: Norval Gable Epworth Score: 8 Referring: Laurin Coder MD Technician: Earney Hamburg Interpreting: Laurin Coder MD Study Type: NPSG Ordered Study Type: Split Night CPAP Study date: 03/26/2019 Location: [place] CLINICAL INFORMATION Lindsay [name] is a 49 year old Female and was referred to the sleep center for evaluation of N/A. Indications include COPD, Snoring, Witnessed Apneas.   Most recent polysomnogram dated 11/30/2014 revealed an AHI of 3.5/h and RDI of 4.5/h. MEDICATIONS Patient self administered medications include: HYDROXYZINE PAMOATE. Medications administered during study include No sleep medicine administered.  SLEEP STUDY TECHNIQUE A multi-channel overnight Polysomnography study was performed. The channels recorded and monitored were central and occipital EEG, electrooculogram (EOG), submentalis EMG (chin), nasal and oral airflow, thoracic and abdominal wall motion, anterior tibialis EMG, snore microphone, electrocardiogram, and a pulse oximetry. TECHNICIAN COMMENTS Comments added by Technician: NONE Comments added by Scorer: N/A SLEEP ARCHITECTURE The study was initiated at 10:15:38 PM and terminated at 4:25:39 AM. The total recorded time was 370 minutes. EEG confirmed total sleep time was 315.1 minutes yielding a sleep efficiency of 85.2%%. Sleep onset after lights out was 5.4 minutes with a REM latency of 54.0 minutes. The patient spent 2.1%% of the night in stage N1 sleep, 81.8%% in stage N2 sleep, 0.0%% in stage N3 and 16.2% in REM. Wake after sleep onset (WASO) was 49.5 minutes. The Arousal Index was 10.9/hour. RESPIRATORY PARAMETERS There were a total of 33 respiratory disturbances out of which 33 were apneas ( 32 obstructive, 0 mixed, 1 central) and 0 hypopneas. The apnea/hypopnea index (AHI) was 6.3  events/hour. The central sleep apnea index was 0.2 events/hour. The REM AHI was 36.5 events/hour and NREM AHI was 0.5 events/hour. The supine AHI was 5.3 events/hour and the non supine AHI was 11.3 supine during 83.21% of sleep. Respiratory disturbances were associated with oxygen desaturation down to a nadir of 74.0% during sleep. The mean oxygen saturation during the study was 96.0%. The cumulative time under 88% oxygen saturation was 5.5 minutes.  LEG MOVEMENT DATA The total leg movements were 0 with a resulting leg movement index of 0.0/hr .Associated arousal with leg movement index was 0.0/hr.  CARDIAC DATA The underlying cardiac rhythm was most consistent with sinus rhythm. Mean heart rate during sleep was 73.2 bpm. Additional rhythm abnormalities include PVCs.  IMPRESSIONS - Mild Obstructive Sleep apnea(OSA) - Electrocardiographic data showed presence of PVCs. - Moderate Oxygen Desaturations during REM sleep. - The patient snored with moderate snoring volume. - No significant periodic leg movements(PLMs) during sleep. However, no significant associated arousals.  DIAGNOSIS - Obstructive Sleep Apnea (327.23 [G47.33 ICD-10]) - Nocturnal Hypoxemia (327.26 [G47.36 ICD-10])  RECOMMENDATIONS - Very mild obstructive sleep apnea. Return to discuss treatment options. - Cpap treatment may be considered if there is significant daytime symptoms. - Avoid alcohol, sedatives and other CNS depressants that may worsen sleep apnea and disrupt normal sleep architecture. - Sleep hygiene should be reviewed to assess factors that may improve sleep quality. - Weight management and regular exercise should be initiated or continued.  [Electronically signed] 04/05/2019 06:18 AM  Sherrilyn Rist MD NPI: [id number]

## 2019-04-05 NOTE — Telephone Encounter (Signed)
Called spoke with patient She is already scheduled for follow up visit with Dr Ander Slade for next week on 3.17.2021 to discuss results.  Nothing further needed; will sign off.

## 2019-04-05 NOTE — Telephone Encounter (Signed)
Sleep study results  Date of study 03/26/2019  Impression: Mild obstructive sleep apnea Moderate oxygen desaturations  Recommendation: Follow-up for discussions regarding treatment options CPAP may be considered if she has significant daytime sleepiness  Encouraged weight loss measures Continue oxygen supplementation.

## 2019-04-06 ENCOUNTER — Ambulatory Visit: Payer: Medicare Other

## 2019-04-07 ENCOUNTER — Other Ambulatory Visit: Payer: Self-pay

## 2019-04-07 ENCOUNTER — Ambulatory Visit: Payer: Medicare Other | Admitting: Internal Medicine

## 2019-04-07 DIAGNOSIS — R1013 Epigastric pain: Secondary | ICD-10-CM | POA: Insufficient documentation

## 2019-04-07 DIAGNOSIS — G4733 Obstructive sleep apnea (adult) (pediatric): Secondary | ICD-10-CM | POA: Diagnosis not present

## 2019-04-07 NOTE — Assessment & Plan Note (Addendum)
Patient reports onset of pain on Monday (04/05/19) following consumption of meatballs and pasta.  Patient describes pain as a dull, aching, sometimes sharp pain in the epigastric region, radiating to the left side, comes and goes, worse with movement and any oral intake.  She reports 3 episodes of nonbilious, nonbloody emesis since Monday.  Patient reports very limited oral intake due to nausea.  She reports subjective fever, chills, and sweating, maximum temperature of 99.3 F.  Patient reports 2 bowel movement since Monday, normal caliber, no blood.  She reports taking Goody powder twice daily since last hospitalization, I counseled patient to avoid this medication for now as it could worsen stomach pain.  Assessment/Plan: With location and quality of pain and intolerance to oral intake, I have concern for acute pancreatitis versus acute gastritis.  Patient does have history of GERD (on omeprazole).  I recommended to patient that she go to emergency room today for evaluation.

## 2019-04-07 NOTE — Progress Notes (Signed)
Internal Medicine Clinic Attending  Case discussed with Dr. MacLean at the time of the visit.  We reviewed the resident's history and exam and pertinent patient test results.  I agree with the assessment, diagnosis, and plan of care documented in the resident's note.    

## 2019-04-07 NOTE — Progress Notes (Signed)
  [facility] Internal Medicine Residency Telephone Encounter Continuity Care Appointment  HPI:   This telephone encounter was created for [name] on 04/07/2019 for the following purpose/cc abdominal pain, nausea, vomiting. Please see problem  Based charting for further details   Past Medical History:  Past Medical History:  Diagnosis Date  . Acid reflux   . Anemia    Iron Def  . Anorexia   . Chronic kidney disease    Nephrotic syndrome  . Colon polyp 2009  . Depression with anxiety   . Edema leg   . Fibromyalgia   . Hemorrhoids   . Hidradenitis suppurativa   . Hypertension   . IBS (irritable bowel syndrome)   . Low back pain   . Migraines   . Morbidly obese ([place])   . Neuromuscular disorder (HCC)    fibromyalgia  . Neuropathy   . Panic attacks   . Polyp of vocal cord or larynx   . Tonsil pain       ROS:  Review of Systems  Constitutional: Positive for chills and fever.  Respiratory: Negative for shortness of breath.   Cardiovascular: Negative for chest pain.  Gastrointestinal: Positive for abdominal pain, nausea and vomiting. Negative for blood in stool, constipation and diarrhea.  Genitourinary: Negative for dysuria.  Musculoskeletal: Positive for back pain (chronic).  All other systems reviewed and are negative.     Assessment / Plan / Recommendations:   Please see A&P under problem oriented charting for assessment of the patient's acute and chronic medical conditions.   As always, pt is advised that if symptoms worsen or new symptoms arise, they should go to an urgent care facility or to to ER for further evaluation.   Consent and Medical Decision Making:   Patient discussed with Dr. Dareen Piano  This is a telephone encounter between Lindsay [name] and Jeanmarie Hubert on 04/07/2019 for discussion of patient's abdominal pain. The visit was conducted with the patient located at home and Jeanmarie Hubert at [facility]. The patient's identity was confirmed using  their DOB and current address. The patient has consented to being evaluated through a telephone encounter and understands the associated risks (an examination cannot be done and the patient may need to come in for an appointment) / benefits (allows the patient to remain at home, decreasing exposure to coronavirus). I personally spent 15 minutes on medical discussion.

## 2019-04-08 ENCOUNTER — Other Ambulatory Visit: Payer: Self-pay

## 2019-04-08 ENCOUNTER — Encounter: Payer: Self-pay | Admitting: Internal Medicine

## 2019-04-08 ENCOUNTER — Emergency Department (HOSPITAL_COMMUNITY): Payer: Medicare Other

## 2019-04-08 ENCOUNTER — Ambulatory Visit (INDEPENDENT_AMBULATORY_CARE_PROVIDER_SITE_OTHER): Payer: Medicare Other | Admitting: Internal Medicine

## 2019-04-08 ENCOUNTER — Encounter (HOSPITAL_COMMUNITY): Payer: Self-pay

## 2019-04-08 ENCOUNTER — Emergency Department (HOSPITAL_COMMUNITY)
Admission: EM | Admit: 2019-04-08 | Discharge: 2019-04-08 | Disposition: A | Discharge status: Home / Self Care | Payer: Medicare Other | Attending: Emergency Medicine | Admitting: Emergency Medicine

## 2019-04-08 VITALS — BP 133/75 | HR 94 | Temp 99.4°F | Ht 71.0 in | Wt 346.1 lb

## 2019-04-08 DIAGNOSIS — R1013 Epigastric pain: Secondary | ICD-10-CM | POA: Diagnosis not present

## 2019-04-08 DIAGNOSIS — F17210 Nicotine dependence, cigarettes, uncomplicated: Secondary | ICD-10-CM | POA: Diagnosis not present

## 2019-04-08 DIAGNOSIS — N189 Chronic kidney disease, unspecified: Secondary | ICD-10-CM | POA: Insufficient documentation

## 2019-04-08 DIAGNOSIS — Z79899 Other long term (current) drug therapy: Secondary | ICD-10-CM | POA: Insufficient documentation

## 2019-04-08 DIAGNOSIS — I129 Hypertensive chronic kidney disease with stage 1 through stage 4 chronic kidney disease, or unspecified chronic kidney disease: Secondary | ICD-10-CM | POA: Insufficient documentation

## 2019-04-08 LAB — URINALYSIS, ROUTINE W REFLEX MICROSCOPIC
Bilirubin Urine: NEGATIVE
Glucose, UA: NEGATIVE mg/dL
Hgb urine dipstick: NEGATIVE
Ketones, ur: NEGATIVE mg/dL
Leukocytes,Ua: NEGATIVE
Nitrite: NEGATIVE
Protein, ur: 100 mg/dL — AB
Specific Gravity, Urine: 1.039 — ABNORMAL HIGH (ref 1.005–1.030)
pH: 5 (ref 5.0–8.0)

## 2019-04-08 LAB — COMPREHENSIVE METABOLIC PANEL
ALT: 9 U/L (ref 0–44)
AST: 10 U/L — ABNORMAL LOW (ref 15–41)
Albumin: 3.8 g/dL (ref 3.5–5.0)
Alkaline Phosphatase: 65 U/L (ref 38–126)
Anion gap: 9 (ref 5–15)
BUN: 5 mg/dL — ABNORMAL LOW (ref 6–20)
CO2: 26 mmol/L (ref 22–32)
Calcium: 8.9 mg/dL (ref 8.9–10.3)
Chloride: 103 mmol/L (ref 98–111)
Creatinine, Ser: 0.93 mg/dL (ref 0.44–1.00)
GFR calc Af Amer: >60 mL/min (ref >60)
GFR calc non Af Amer: >60 mL/min (ref >60)
Glucose, Bld: 98 mg/dL (ref 70–99)
Potassium: 4.3 mmol/L (ref 3.5–5.1)
Sodium: 138 mmol/L (ref 135–145)
Total Bilirubin: 0.7 mg/dL (ref 0.3–1.2)
Total Protein: 6.9 g/dL (ref 6.5–8.1)

## 2019-04-08 LAB — CBC WITH DIFFERENTIAL/PLATELET
Abs Immature Granulocytes: 0.02 10*3/uL (ref 0.00–0.07)
Basophils Absolute: 0 10*3/uL (ref 0.0–0.1)
Basophils Relative: 0 %
Eosinophils Absolute: 0.2 10*3/uL (ref 0.0–0.5)
Eosinophils Relative: 3 %
HCT: 46.4 % — ABNORMAL HIGH (ref 36.0–46.0)
Hemoglobin: 14.4 g/dL (ref 12.0–15.0)
Immature Granulocytes: 0 %
Lymphocytes Relative: 36 %
Lymphs Abs: 2.4 10*3/uL (ref 0.7–4.0)
MCH: 26.1 pg (ref 26.0–34.0)
MCHC: 31 g/dL (ref 30.0–36.0)
MCV: 84.2 fL (ref 80.0–100.0)
Monocytes Absolute: 0.7 10*3/uL (ref 0.1–1.0)
Monocytes Relative: 11 %
Neutro Abs: 3.3 10*3/uL (ref 1.7–7.7)
Neutrophils Relative %: 50 %
Platelets: 388 10*3/uL (ref 150–400)
RBC: 5.51 MIL/uL — ABNORMAL HIGH (ref 3.87–5.11)
RDW: 16.3 % — ABNORMAL HIGH (ref 11.5–15.5)
WBC: 6.7 10*3/uL (ref 4.0–10.5)
nRBC: 0 % (ref 0.0–0.2)

## 2019-04-08 LAB — LIPASE, BLOOD: Lipase: 18 U/L (ref 11–51)

## 2019-04-08 LAB — POC OCCULT BLOOD, ED: Fecal Occult Bld: NEGATIVE

## 2019-04-08 IMAGING — CT CT ABD-PELV W/ CM
2 of 5 series · 17 of 46 positions shown, 19 images · IV contrast (Omni 300)
Comparison: [DATE]

CLINICAL DATA: Epigastric pain.

EXAM:
CT ABDOMEN AND PELVIS WITH CONTRAST
TECHNIQUE: Multidetector CT imaging of the abdomen and pelvis was performed
using the standard protocol following bolus administration of
intravenous contrast.
CONTRAST:  100mL OMNIPAQUE IOHEXOL 300 MG/ML  SOLN

[Series 3: a/p w/ 5mm · axial · 0.87mm/px · z∈[+806,+1231]mm · 14 of 97 slices shown, 16 images]
[im 6/97  soft-tissue]
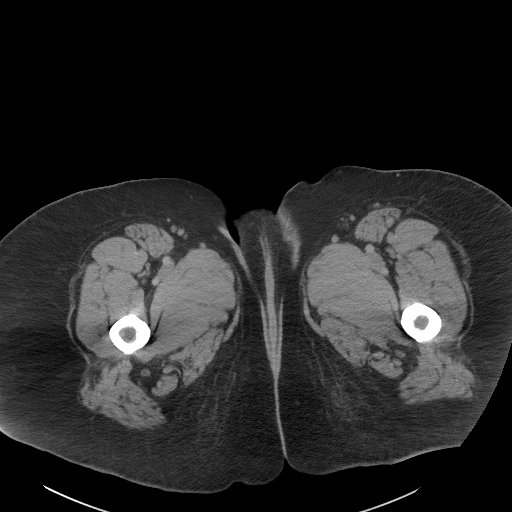
[im 6/97  bone]
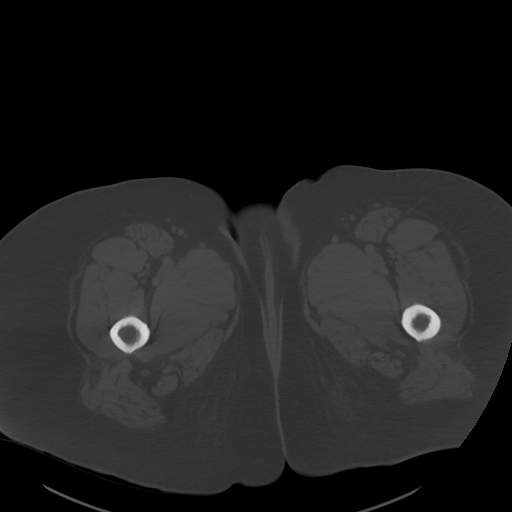
[im 12/97  soft-tissue]
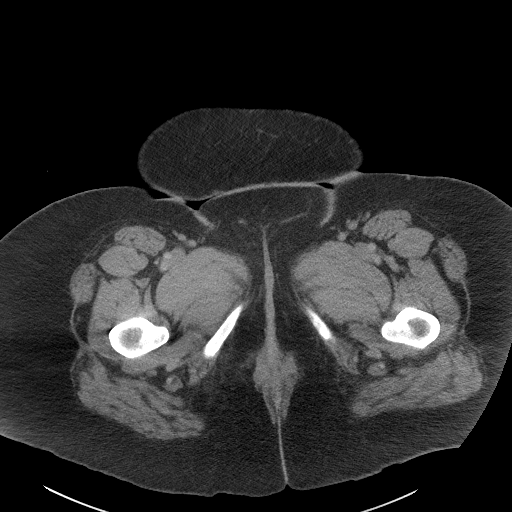
[im 17/97  soft-tissue]
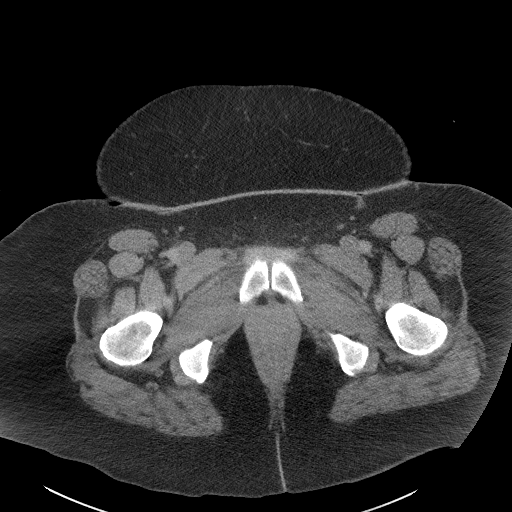
[im 29/97  soft-tissue]
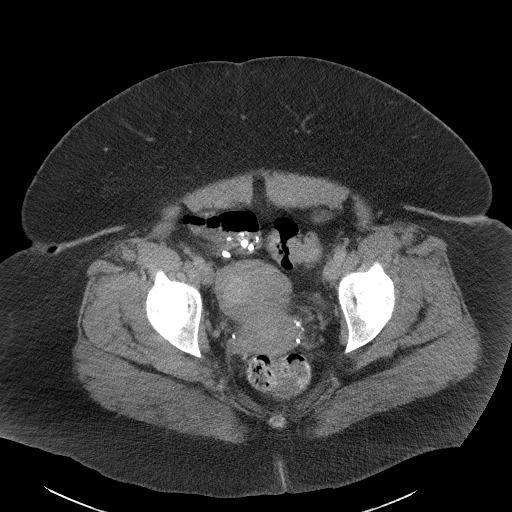
[im 34/97  soft-tissue]
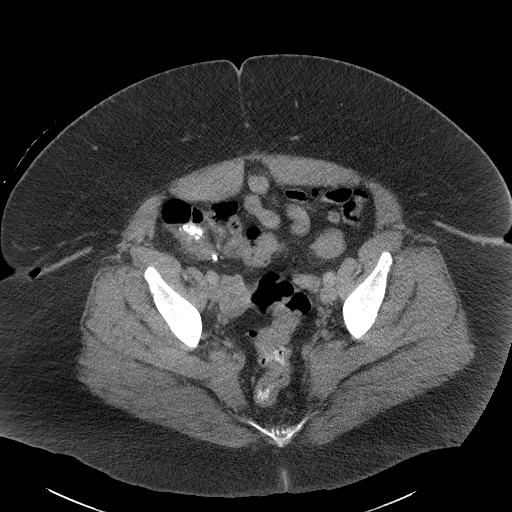
[im 40/97  soft-tissue]
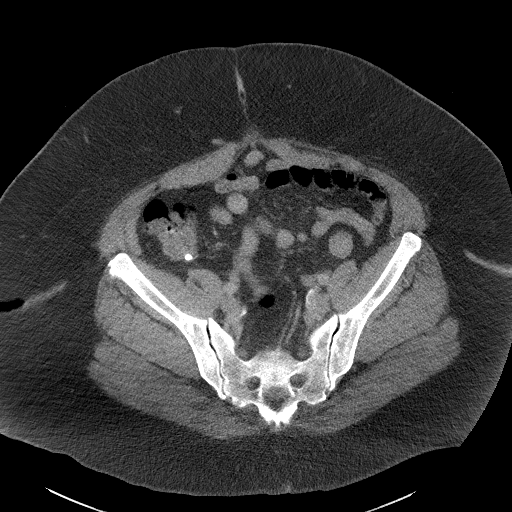
[im 46/97  soft-tissue]
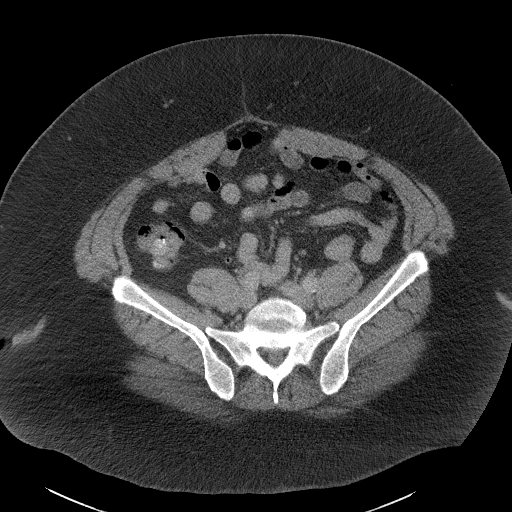
[im 51/97  soft-tissue]
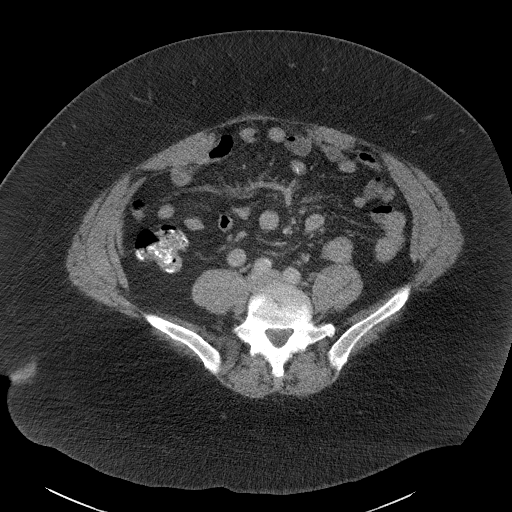
[im 57/97  soft-tissue]
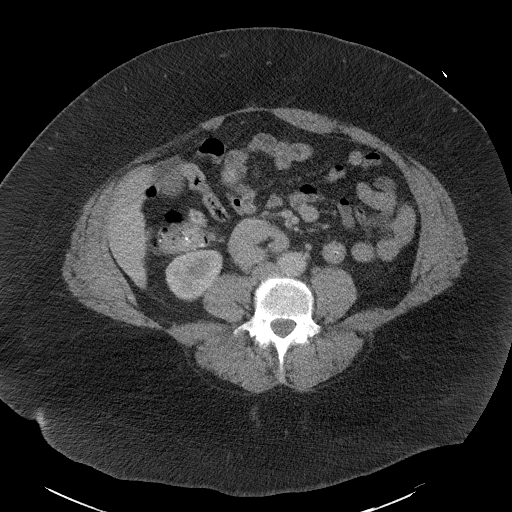
[im 57/97  bone]
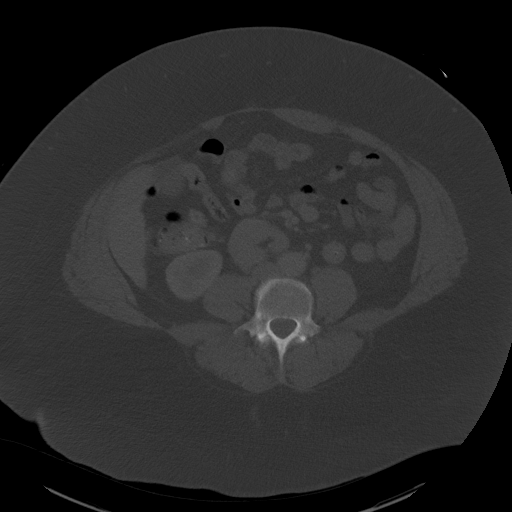
[im 63/97  soft-tissue]
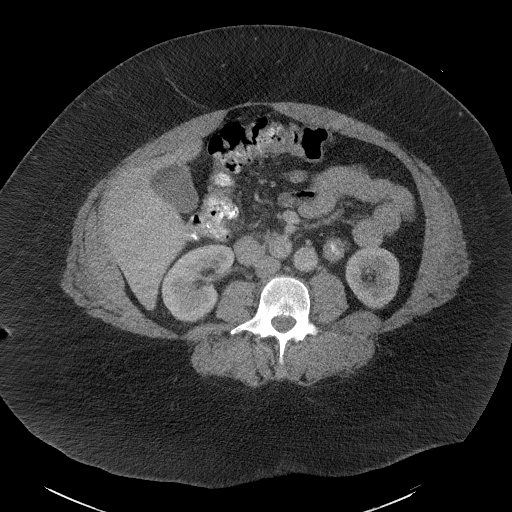
[im 74/97  soft-tissue]
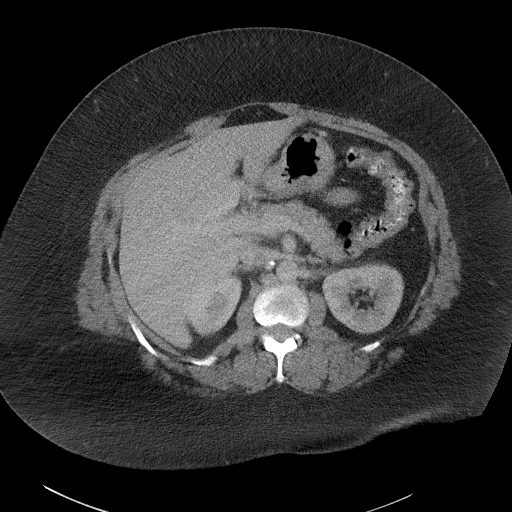
[im 80/97  soft-tissue]
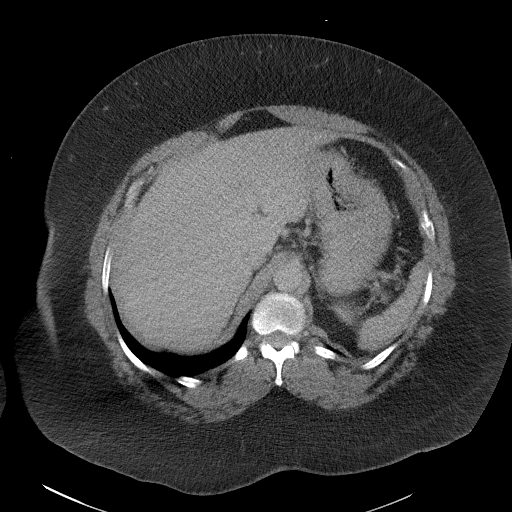
[im 85/97  soft-tissue]
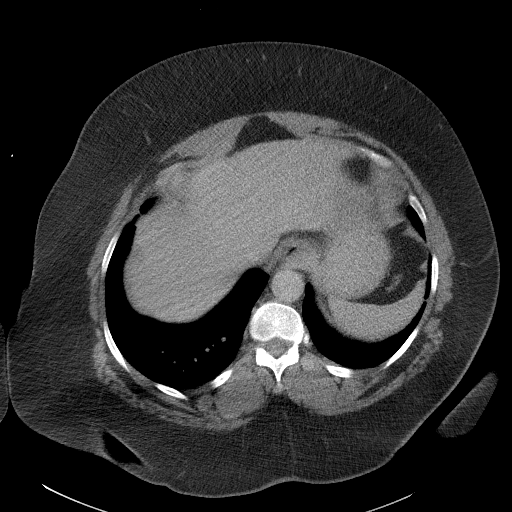
[im 91/97  soft-tissue]
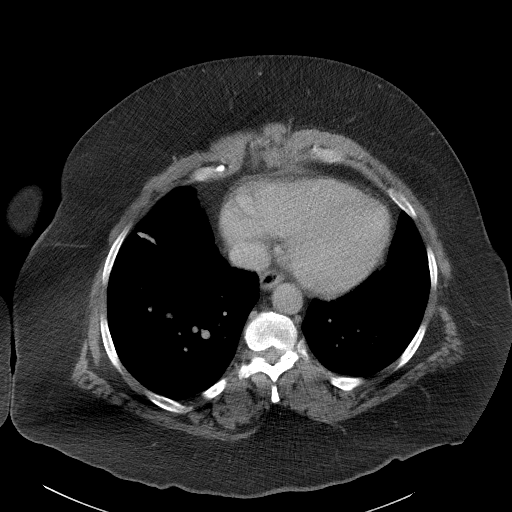

[Series 6: a/p w/ cor · coronal · 0.96mm/px · 3 of 200 slices shown]
[im 67/200  soft-tissue]
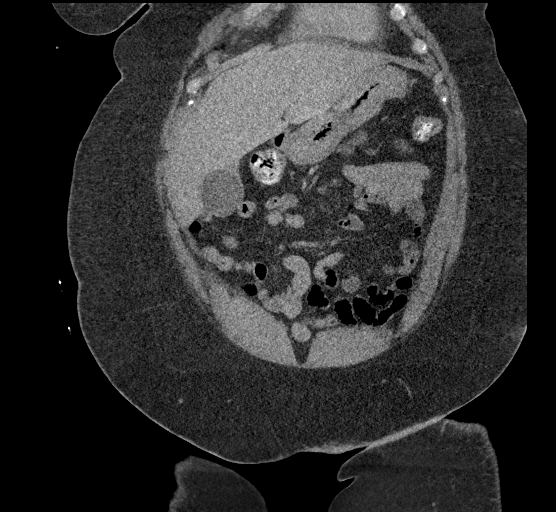
[im 89/200  soft-tissue]
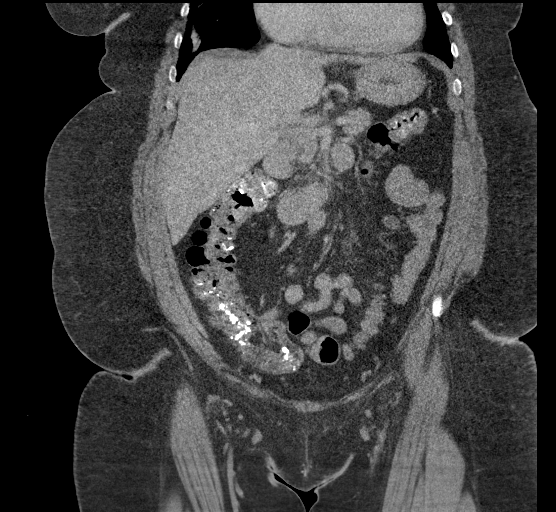
[im 111/200  soft-tissue]
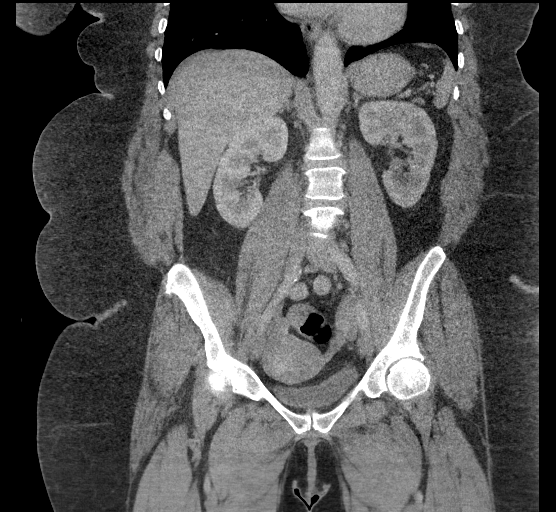

[17 of 46 positions shown; findings below may reference images not displayed]

FINDINGS: Lower chest: No acute abnormality.

Hepatobiliary: No focal liver abnormality is seen. No gallstones,
gallbladder wall thickening, or biliary dilatation.

Pancreas: Unremarkable. No pancreatic ductal dilatation or
surrounding inflammatory changes.

Spleen: Normal in size without focal abnormality.

Adrenals/Urinary Tract: Adrenal glands are unremarkable. Kidneys are
normal, without renal calculi, focal lesion, or hydronephrosis.
Bladder is unremarkable.

Stomach/Bowel: Stomach is within normal limits. Appendix appears
normal. No evidence of bowel wall thickening, distention, or
inflammatory changes.

Vascular/Lymphatic: Aortic atherosclerosis. No enlarged abdominal or
pelvic lymph nodes.

Reproductive: Uterus and bilateral adnexa are unremarkable.

Other: None.

Musculoskeletal: No acute abnormality identified.
IMPRESSION: 1. No acute abnormality identified in the abdomen and pelvis.
2. No bowel obstruction. The appendix is normal.

Aortic Atherosclerosis ([MI]-[MI]).

## 2019-04-08 MED ORDER — IOHEXOL 300 MG/ML  SOLN
100.0000 mL | Freq: Once | INTRAMUSCULAR | Status: AC | PRN
  Administered 2019-04-08: 100 mL via INTRAVENOUS

## 2019-04-08 MED ORDER — MORPHINE SULFATE (PF) 4 MG/ML IV SOLN
4.0000 mg | Freq: Once | INTRAVENOUS | Status: AC
  Administered 2019-04-08: 13:00:00 4 mg via INTRAVENOUS
  Filled 2019-04-08: qty 1

## 2019-04-08 NOTE — Discharge Instructions (Signed)
As discussed, your labs and CT scan were reassuring today. I have included the number of a GI doctor. Call today to schedule an appointment for further evaluation. Do not take NSAIDs until you have been evaluated by GI. You may take over the counter Tylenol as needed for pain. Follow-up with PCP within the next week if symptoms do not improve. Return to the ER for new or worsening symptoms.

## 2019-04-08 NOTE — Progress Notes (Signed)
   CC: severe epigastric abdominal pain  HPI:[name] is a 49 y.o. female who presents for evaluation of ABD pain. Please see individual problem based A/P for details.  Depression, PHQ-9: Based on the patients    Office Visit from 04/08/2019 in [facility]  PHQ-9 Total Score  14     score we have decided to continue current therapy and to treat her acute illness.  Past Medical History:  Diagnosis Date  . Acid reflux   . Anemia    Iron Def  . Anorexia   . Chronic kidney disease    Nephrotic syndrome  . Colon polyp 2009  . Depression with anxiety   . Edema leg   . Fibromyalgia   . Hemorrhoids   . Hidradenitis suppurativa   . Hypertension   . IBS (irritable bowel syndrome)   . Low back pain   . Migraines   . Morbidly obese ([place])   . Neuromuscular disorder (HCC)    fibromyalgia  . Neuropathy   . Panic attacks   . Polyp of vocal cord or larynx   . Tonsil pain    Review of Systems:  ROS negative except as per HPI.  Physical Exam: Vitals:   04/08/19 1118  BP: 133/75  Pulse: 94  Temp: 99.4 F (37.4 C)  TempSrc: Oral  SpO2: 98%  Weight: (!) 346 lb 1.6 oz (157 kg)  Height: 5\' 11"  (1.803 m)   Filed Weights   04/08/19 1118  Weight: (!) 346 lb 1.6 oz (157 kg)   General: A/O x4, in no acute distress, afebrile, nondiaphoretic HEENT: PEERL, EMO intact Cardio: RRR, no mrg's  Abdomen: Soft, diffusely tender to palpation MSK: BLE nonedematous, small area of tenderness of the anterior ankle with no specific finding noted.  Neuro: Alert, CNII-XII grossly intact, conversational, strength 5/5 in the upper and lower extremities bilaterally, normal gait Psych: Appropriate affect, not depressed in appearance, engages well  Assessment & Plan:   See Encounters Tab for problem based charting.  Patient discussed with Dr. Rebeca Alert

## 2019-04-08 NOTE — Assessment & Plan Note (Addendum)
Epigastric abdominal pain: Onset on the 8th. Severe 10/10 pain, epigastric location with radiation to her back at times more so on the right than the left but bilateral. It is worse with any food and as such she has not been eating much in the last two days. She has associated dark stool, and heavy NSAID use with BC powder at least 4 packets daily. She denied hematuria or dysuria. Has a history of constipation but more normal bowel movements of recent. Denied notable EtOH use or tobacco use.  My concern is most high for PUD but the differential also includes pancreatitis and cholecystitis/choledocholithiasis. At the minimum she needs pain control, basic labs with reflex to possible imagining.  (CT renal study in 9/20 did not demonstrate hepatobiliary or renal process per my review.) She does not appear to have an acute abdomin or prominent peritoneal signs but she is tender diffusely in the abdomin.  She denied fever, chills, cough, dyspnea or joint pain.   Plan: STAT CBC, CMP and lipase ordered Will refer to ER as we can not treat her pain nor complete the evaluation in the clinic unfortunately  Additionally, if severe, she may require admission and imaging

## 2019-04-08 NOTE — ED Provider Notes (Signed)
[facility] DEPARTMENT Provider Note   CSN: [id number] Arrival date & time: 04/08/19  1159     History Chief Complaint  Patient presents with  . Abdominal Pain    Lindsay [name] is a 49 y.o. female with a past medical history significant for GERD, anemia, IBS, depression, anxiety, fibromyalgia, hypertension, and morbid obesity who presents to the ED due to worsening abdominal pain x4 days.  Patient states abdominal pain started in her epigastric region and has now migrated around her umbilicus which radiates to the right side of her abdomen associated with nausea and 4 episodes of nonbloody, nonbilious emesis.  She also admits to black tarry stool that started last night.  Abdominal pain is worse with meals.  She admits to taking 4 BC powders daily for arthritic pain.  Denies urinary and vaginal symptoms.  Denies diarrhea.  No history of pancreatitis, PUD, or kidney stones.  No past abdominal operations.  Denies fever and chills.  Chart reviewed.  Patient was seen at PCP just prior to arrival and sent to the ED for further evaluation of abdominal pain.  CBC, CMP, and lipase ordered in office.  Patient denies chronic alcohol use.      Past Medical History:  Diagnosis Date  . Acid reflux   . Anemia    Iron Def  . Anorexia   . Chronic kidney disease    Nephrotic syndrome  . Colon polyp 2009  . Depression with anxiety   . Edema leg   . Fibromyalgia   . Hemorrhoids   . Hidradenitis suppurativa   . Hypertension   . IBS (irritable bowel syndrome)   . Low back pain   . Migraines   . Morbidly obese ([place])   . Neuromuscular disorder (HCC)    fibromyalgia  . Neuropathy   . Panic attacks   . Polyp of vocal cord or larynx   . Tonsil pain     Patient Active Problem List   Diagnosis Date Noted  . Abdominal pain, acute, epigastric 04/07/2019  . Pain in gums 03/15/2019  . Compression fracture of body of thoracic vertebra ([place]) 03/08/2019  . Acute  respiratory failure ([place]) 03/06/2019  . URI (upper respiratory infection) 02/24/2019  . Iron deficiency 02/10/2019  . History of colon polyps   . Vision blurring 09/17/2018  . Fall 06/30/2018  . Frequent urination at night 04/23/2018  . Chronic cough 03/04/2018  . Chronic back pain 12/05/2017  . Dental infection 12/05/2017  . Right arm pain 12/05/2017  . Chronic respiratory failure with hypoxia ([place])   . Low serum vitamin B12 05/02/2017  . Palpitations 04/11/2017  . Nutritional anemia 06/17/2016  . Allergic rhinitis 06/12/2016  . Vitamin D deficiency, unspecified 06/12/2016  . Nicotine dependence 06/12/2016  . Menorrhagia 01/08/2016  . Migraines 01/08/2016  . Bilateral leg pain 11/29/2015  . Hemorrhoids 07/05/2015  . Fibromyalgia 07/05/2015  . Chronic leg pain 09/26/2014  . Healthcare maintenance 09/26/2014  . Morbid obesity ([place]) 07/04/2014  . Major depressive disorder, recurrent episode, moderate ([place]) 10/13/2012  . Generalized anxiety disorder 10/13/2012  . Vaginal yeast infection 11/27/2010  . Chronic nausea 05/10/2010  . IBS (irritable bowel syndrome) 06/16/2007  . Essential hypertension 02/06/2006  . HIDRADENITIS 11/18/2005    Past Surgical History:  Procedure Laterality Date  . AXILLARY HIDRADENITIS EXCISION    . COLONOSCOPY WITH PROPOFOL N/A 05/25/2015   Procedure: COLONOSCOPY WITH PROPOFOL;  Surgeon: Milus Banister, MD;  Location: [facility];  Service: Endoscopy;  Laterality: N/A;  . COLONOSCOPY WITH PROPOFOL N/A 12/31/2018   Procedure: COLONOSCOPY WITH PROPOFOL;  Surgeon: Milus Banister, MD;  Location: [facility];  Service: Endoscopy;  Laterality: N/A;  . HEMORRHOID SURGERY     with Hidradenitis surgery   . INGUINAL HIDRADENITIS EXCISION    . TONSILLECTOMY  10/18/2010   by Dr. Wilburn Cornelia  . UPPER GASTROINTESTINAL ENDOSCOPY       OB History    Gravida  2   Para  1   Term      Preterm      AB  1   Living        SAB      TAB  1   Ectopic        Multiple      Live Births              Family History  Problem Relation Age of Onset  . Diabetes Mother   . Heart disease Mother        valve leak  . Anxiety disorder Mother   . Depression Mother   . High blood pressure Mother   . Kidney failure Mother   . Cancer Father        prostate  . Heart disease Father   . Learning disabilities Sister   . Depression Sister   . Depression Sister   . Anxiety disorder Sister   . Colon cancer Neg Hx     Social History   Tobacco Use  . Smoking status: Current Every Day Smoker    Packs/day: 0.10    Years: 25.00    Pack years: 2.50    Types: Cigarettes  . Smokeless tobacco: Never Used  . Tobacco comment: DOWN TO 1 A DAY  Substance Use Topics  . Alcohol use: Not Currently    Alcohol/week: 0.0 standard drinks    Comment: none sine 05/2018  . Drug use: Not Currently    Frequency: 3.0 times per week    Types: Marijuana    Home Medications Prior to Admission medications   Medication Sig Start Date End Date Taking? Authorizing Provider  acetaminophen (TYLENOL) 650 MG CR tablet Take 1,300 mg by mouth every 8 (eight) hours as needed for pain.   Yes [provider]  albuterol (PROVENTIL) (2.5 MG/3ML) 0.083% nebulizer solution Take 3 mLs (2.5 mg total) by nebulization every 4 (four) hours. 08/12/18  Yes Olalere, Adewale A, MD  albuterol (VENTOLIN HFA) 108 (90 Base) MCG/ACT inhaler TAKE 2 PUFFS BY MOUTH EVERY 6 HOURS AS NEEDED FOR WHEEZE OR SHORTNESS OF BREATH Patient taking differently: Inhale 2 puffs into the lungs every 6 (six) hours as needed for wheezing or shortness of breath.  02/22/19  Yes Olalere, Adewale A, MD  amLODipine (NORVASC) 10 MG tablet TAKE 1/2 TABLET BY MOUTH EVERY DAY Patient taking differently: Take 5 mg by mouth daily.  02/16/19  Yes Velna Ochs, MD  Ascorbic Acid (VITAMIN C PO) Take 1 tablet by mouth daily.   Yes [provider]  Aspirin-Salicylamide-Caffeine (BC HEADACHE POWDER PO)  Take 2 Packages by mouth as needed (headache).   Yes [provider]  BIOTIN PO Take 1 tablet by mouth daily.   Yes [provider]  Brexpiprazole (REXULTI) 3 MG TABS Take 1 tablet (3 mg total) by mouth daily. 03/18/19  Yes [name], Arlyce Harman, MD  CHANTIX CONTINUING MONTH PAK 1 MG tablet TAKE 1 TABLET BY MOUTH TWICE A DAY Patient taking differently: Take 1 mg by  mouth 2 (two) times daily.  03/17/19  Yes Olalere, Adewale A, MD  cholecalciferol (VITAMIN D) 1000 units tablet Take 1 tablet (1,000 Units total) by mouth daily. 05/06/17  Yes Velna Ochs, MD  clorazepate (TRANXENE) 7.5 MG tablet Take 7.5 mg by mouth daily as needed. Severe panic attacks 03/30/19  Yes [provider]  hydrochlorothiazide (HYDRODIURIL) 25 MG tablet TAKE 1 TABLET BY MOUTH EVERY DAY Patient taking differently: Take 25 mg by mouth daily.  11/27/18  Yes Velna Ochs, MD  HYDROcodone-acetaminophen (NORCO) 7.5-325 MG tablet Take 1 tablet by mouth every 8 (eight) hours as needed for up to 5 days for severe pain. 04/01/19 04/08/19 Yes Jeanmarie Hubert, MD  hydrOXYzine (VISTARIL) 50 MG capsule Take 1 capsule (50 mg total) by mouth at bedtime as needed for anxiety. 02/04/19  Yes [name], Arlyce Harman, MD  lamoTRIgine (LAMICTAL) 25 MG tablet Take three tab daily Patient taking differently: Take 25-50 mg by mouth See admin instructions. Take 50mg  in the morning and 25mg  in the evening. 03/18/19  Yes [name], Arlyce Harman, MD  lidocaine (XYLOCAINE) 5 % ointment Apply 1 application topically as needed. 04/01/19  Yes Jeanmarie Hubert, MD  omeprazole (PRILOSEC) 40 MG capsule Take 1 capsule (40 mg total) by mouth daily. 06/29/18  Yes Sid Falcon, MD  OXYGEN Inhale 4 L into the lungs.   Yes [provider]  pregabalin (LYRICA) 300 MG capsule Take 1 capsule (300 mg total) by mouth 2 (two) times daily. 02/10/19  Yes Velna Ochs, MD  Probiotic Product (PROBIOTIC PO) Take 1 capsule by mouth daily.   Yes [provider]  promethazine (PHENERGAN) 12.5 MG tablet Take 1 tablet (12.5 mg total) by mouth every 6 (six) hours as needed for nausea or vomiting. 03/11/19  Yes Milus Banister, MD  SYMBICORT 160-4.5 MCG/ACT inhaler TAKE 2 PUFFS BY MOUTH TWICE A DAY Patient taking differently: Inhale 2 puffs into the lungs in the morning and at bedtime.  03/29/19  Yes Olalere, Adewale A, MD  valsartan (DIOVAN) 160 MG tablet Take 1 tablet (160 mg total) by mouth daily. 07/08/18  Yes Velna Ochs, MD  vitamin B-12 (CYANOCOBALAMIN) 1000 MCG tablet Take 1 tablet (1,000 mcg total) by mouth daily. 05/06/17  Yes Velna Ochs, MD  VITAMIN E PO Take 1 capsule by mouth daily.   Yes [provider]    Allergies    Lisinopril  Review of Systems   Review of Systems  Constitutional: Negative for chills and fever.  Respiratory: Negative for shortness of breath.   Cardiovascular: Negative for chest pain.  Gastrointestinal: Positive for abdominal pain, nausea and vomiting. Negative for diarrhea.  Genitourinary: Negative for dysuria.  All other systems reviewed and are negative.   Physical Exam Updated Vital Signs BP (!) 137/96   Pulse 73   Temp 98.6 F (37 C) (Oral)   Resp 14   SpO2 98%   Physical Exam Vitals and nursing note reviewed.  Constitutional:      General: She is not in acute distress.    Appearance: She is not toxic-appearing.     Comments: Appears uncomfortable in bed.  HENT:     Head: Normocephalic.  Eyes:     Pupils: Pupils are equal, round, and reactive to light.  Cardiovascular:     Rate and Rhythm: Normal rate and regular rhythm.     Pulses: Normal pulses.     Heart sounds: Normal heart sounds. No murmur. No friction rub. No gallop.  Pulmonary:     Effort: Pulmonary effort is normal.     Breath sounds: Normal breath sounds.  Abdominal:     General: Abdomen is flat. Bowel sounds are normal. There is no distension.     Palpations: Abdomen is soft.     Tenderness: There is  abdominal tenderness. There is right CVA tenderness and left CVA tenderness. There is no guarding or rebound.     Comments: Diffuse abdominal tenderness most significant in epigastric region.  No rebound or guarding.  Positive CVA tenderness bilaterally.  Genitourinary:    Rectum: Normal. Guaiac result negative.     Comments: No visible hemorrhoid or fissure. Black stool. Fecal occult negative. Musculoskeletal:     Cervical back: Neck supple.     Comments: Able to move all 4 extremities without difficulty.  No lower extremity edema.  Skin:    General: Skin is warm and dry.  Neurological:     General: No focal deficit present.     Mental Status: She is alert.  Psychiatric:        Mood and Affect: Mood normal.        Behavior: Behavior normal.     ED Results / Procedures / Treatments   Labs (all labs ordered are listed, but only abnormal results are displayed) Labs Reviewed  URINALYSIS, ROUTINE W REFLEX MICROSCOPIC - Abnormal; Notable for the following components:      Result Value   Color, Urine AMBER (*)    APPearance HAZY (*)    Specific Gravity, Urine 1.039 (*)    Protein, ur 100 (*)    Bacteria, UA RARE (*)    All other components within normal limits  POC OCCULT BLOOD, ED    EKG None  Radiology CT ABDOMEN PELVIS W CONTRAST  Result Date: 04/08/2019 CLINICAL DATA:  Epigastric pain. EXAM: CT ABDOMEN AND PELVIS WITH CONTRAST TECHNIQUE: Multidetector CT imaging of the abdomen and pelvis was performed using the standard protocol following bolus administration of intravenous contrast. CONTRAST:  160mL OMNIPAQUE IOHEXOL 300 MG/ML  SOLN COMPARISON:  October 11, 2018 FINDINGS: Lower chest: No acute abnormality. Hepatobiliary: No focal liver abnormality is seen. No gallstones, gallbladder wall thickening, or biliary dilatation. Pancreas: Unremarkable. No pancreatic ductal dilatation or surrounding inflammatory changes. Spleen: Normal in size without focal abnormality.  Adrenals/Urinary Tract: Adrenal glands are unremarkable. Kidneys are normal, without renal calculi, focal lesion, or hydronephrosis. Bladder is unremarkable. Stomach/Bowel: Stomach is within normal limits. Appendix appears normal. No evidence of bowel wall thickening, distention, or inflammatory changes. Vascular/Lymphatic: Aortic atherosclerosis. No enlarged abdominal or pelvic lymph nodes. Reproductive: Uterus and bilateral adnexa are unremarkable. Other: None. Musculoskeletal: No acute abnormality identified. IMPRESSION: 1. No acute abnormality identified in the abdomen and pelvis. 2. No bowel obstruction. The appendix is normal. Aortic Atherosclerosis (ICD10-I70.0). Electronically Signed   By: Abelardo Diesel M.D.   On: 04/08/2019 13:49   SLEEP STUDY DOCUMENTS  Result Date: 04/07/2019 Ordered by an unspecified provider.   Procedures Procedures (including critical care time)  Medications Ordered in ED Medications  morphine 4 MG/ML injection 4 mg (4 mg Intravenous Given 04/08/19 1322)  iohexol (OMNIPAQUE) 300 MG/ML solution 100 mL (100 mLs Intravenous Contrast Given 04/08/19 1326)    ED Course  I have reviewed the triage vital signs and the nursing notes.  Pertinent labs & imaging results that were available during my care of the patient were reviewed by me and considered in my medical decision making (see chart for details).  Clinical Course  as of Apr 08 1430  Thu Apr 08, 2019  1339 Fecal Occult Blood, POC: NEGATIVE [CA]    Clinical Course User Index [CA] Suzy Bouchard, PA-C   MDM Rules/Calculators/A&P                     49 year old female presents to the ED from PCP due to abdominal pain worse in the epigastric region x4 days.  Abdominal pain is worse after meals.  History of chronic NSAID use.  No alcohol use.  No past abdominal operations.  Stable vitals.  Patient is afebrile, not tachycardic or hypoxic.  Patient in no acute distress and nontoxic-appearing.  Diffuse abdominal  tenderness throughout most significant in epigastric region.  No rebound or guarding. Will obtain CT abdomen to rule out emergent intraabdominal etiology.  Lab work obtained at PCP office just prior to arrival.  CBC reassuring with no leukocytosis.  CMP reassuring with no electrolyte derangements and normal renal function.  Lipase normal at 18.  Doubt pancreatitis. Fecal occult negative. Suspect black stool from pepto bismol patient used yesterday. No concern for GI bleed at this time.  CT abdomen personally reviewed which demonstrates:   IMPRESSION:  1. No acute abnormality identified in the abdomen and pelvis.  2. No bowel obstruction. The appendix is normal.   Upon reassessment, patient notes pain has improved after pain medication. Abdomen soft, non-distended with mild diffuse tenderness. Improvement from previous evaluation. Will discharge patient with GI referral. Advised patient to hold NSAIDs at this time. Advised patient to take over the counter Tylenol as needed for pain. Strict ED precautions discussed with patient. Patient states understanding and agrees to plan. Patient discharged home in no acute distress and stable vitals.  Discussed case with Dr. Alvino Chapel who agrees with assessment and plan.  Final Clinical Impression(s) / ED Diagnoses Final diagnoses:  Epigastric pain    Rx / DC Orders ED Discharge Orders    None       Karie Kirks 04/08/19 1432    Davonna Belling, MD 04/08/19 1458

## 2019-04-08 NOTE — Patient Instructions (Signed)
I recommend strongly that you proceed to the ER for consideration of imaging. We will obtain STAT labs at this time to assess for some of the more common issues that can cause abdominal pain such as yours but feel that it is highly possible you have either an ulcer, pancreatitis(inflammation of the pancreas) or cholecystitis (inflammation of the gallbladder. For this, you will need pain control medications and possibly fluids as you can not eat.   Thank you for your visit to the [facility] today. If you have any questions or concerns please call us at [phone].

## 2019-04-08 NOTE — Progress Notes (Signed)
Internal Medicine Clinic Attending  Case discussed with Dr. Berline Lopes at the time of the visit.  We reviewed the resident's history and exam and pertinent patient test results.  I agree with the assessment, diagnosis, and plan of care documented in the resident's note.  Fortunately, workup reassuring with normal BMP, LFTs, lipase, and CBC. Evaluated in ED, appears CT abdomen was normal, pain improved on reevaluation, she was discharged from ED with GI referral. We will need to follow up with her on how her pain is doing with no clear etiology.   Lenice Pressman, M.D., Ph.D.

## 2019-04-08 NOTE — ED Notes (Signed)
Pt transported to CT ?

## 2019-04-08 NOTE — ED Triage Notes (Signed)
Pt reports abd pain since Monday from her umbilicus to her right side. Pt sent here from IM for imaging. Lab work done in office. Still in process.

## 2019-04-08 NOTE — Addendum Note (Signed)
Addended byJeanmarie Hubert T on: 04/08/2019 01:16 PM   Modules accepted: Level of Service

## 2019-04-09 ENCOUNTER — Telehealth: Payer: Self-pay | Admitting: Internal Medicine

## 2019-04-09 ENCOUNTER — Ambulatory Visit (INDEPENDENT_AMBULATORY_CARE_PROVIDER_SITE_OTHER): Payer: Medicare Other | Admitting: Licensed Clinical Social Worker

## 2019-04-09 DIAGNOSIS — F4312 Post-traumatic stress disorder, chronic: Secondary | ICD-10-CM

## 2019-04-09 DIAGNOSIS — F331 Major depressive disorder, recurrent, moderate: Secondary | ICD-10-CM

## 2019-04-09 DIAGNOSIS — F411 Generalized anxiety disorder: Secondary | ICD-10-CM

## 2019-04-09 NOTE — Progress Notes (Signed)
Virtual Visit via Telephone Note  I connected with Lindsay [name] on 04/09/19 at  8:00 AM EST by telephone and verified that I am speaking with the correct person using two identifiers.   I discussed the limitations, risks, security and privacy concerns of performing an evaluation and management service by telephone and the availability of in person appointments. I also discussed with the patient that there may be a patient responsible charge related to this service. The patient expressed understanding and agreed to proceed.   I discussed the assessment and treatment plan with the patient. The patient was provided an opportunity to ask questions and all were answered. The patient agreed with the plan and demonstrated an understanding of the instructions.   The patient was advised to call back or seek an in-person evaluation if the symptoms worsen or if the condition fails to improve as anticipated.  I provided 55 minutes of non-face-to-face time during this encounter.  THERAPIST PROGRESS NOTE  Session Time: 8:00 AM to 8:55 AM  Participation Level: Active  Behavioral Response: CasualAlertAnxious and Dysphoric  Type of Therapy: Individual Therapy  Treatment Goals addressed: help cope with health issues, depression, stress management  Interventions: Solution Focused, Strength-based, Supportive and Other: grief, self-esteem, coping  Summary: Lindsay [name] is a 49 y.o. female who presents with new medical incident of pain in stomach. Sent to ER and relates that gets the same thing all the time, that is doesn't get answers. Outcome was for her to go to GI and PCP. Went to see Lindsay [name] who is doctor of pain management and sports orthopedic doctor. Supposed to get back injections. Worked for legs and knees so she hopes it works for back. Shoulder hurts since February 6. Patient relates that P3 is fractured but needs somebody to address pain in shoulder. Lidocaine helps but not when it gets that bad.  Ponders if rotator cuff gone?. Can't lay on that side. Plan for CT scan. Shared felt bad when private imformation shared where people could over hear. Describes being in a box or a shell and can't get out of it with people saying things like that. Describes it is hard for her to get out of the shell or box. Not comfortable with self. Don't look at mirrors. That is ever since a child.  Work with patient on self-esteem strategies (see below) at the same time validating patient with recognition of need to be assertive in situations like at the office to tell people how we feel when they do something we are not happy about.  Shared anniversary of Dad's death 36 years ago yesterday. He was very humble and quiet. Mom raise hell. He never argued with her. Laid back. Did lawn work. Patient was 49 y.o when he died in 41.  Reviewed ways patient may be like her dad and she shares doesn't like conflict and help everyone in the neighborhood.  Therapist pointed out how she was able to see the big picture and how helping people was in the best interest for everyone. Shared she didn't plant flowers or read anything, did want to cry. At doctor's focus on the medical part. Was in so much pain.  Reviewed ways to come memory anniversary even if not on the day patient said at least she is going to plant her garden her sister gave her and her windowsill.    Worried about her image made her feel bad about herself. Never good enough.    Suicidal/Homicidal: No  Therapist Response:  Reviewed symptoms, actively working with patient on treatment goal of coping with health issues through facilitating expression of thoughts and feelings to help process feelings, assessing feelings to help with stress and anxiety related to health issues.  Reviewed current status of medical issues, where patient is in terms of treatment she is getting and upcoming plan for treatment so that therapist can be supportive as patient addresses her health  issues.  Validated patient on how she was feeling related to stressors of addressing her health issues and at the same time providing positive feedback for positive developments in her care to be both supportive and positive focused as a strategy that will help patient cope.  Work with patient on self-esteem providing education as basic foundation of self-esteem is unconditional worth, work with patient on less concern of what other people think, reviewing when it comes to self-esteem only 1 thing matters that is her own and that needs to be scrutinized.  Reviewed we will have a negative critical voice this is universal and we have to learn to pay less attention to it challenge it.  Utilize strength-based intervention and discussing aspects of patient's character that helped other people and reviewed she was acting from her best self and helping people.  Processed patient's feelings related to anniversary of father's death, pointed out positive qualities that she has that she got from him and ways she can memorialize anniversary even if not able to do it on the day he died.  Plan: Return again in 2 weeks.2.Stress management, anxiety, self-esteem.3.  Patient review article from psychology today "8 steps to improving self-esteem"  Diagnosis: Axis I:   Major depressive disorder, recurrent, moderate, generalized anxiety disorder    Axis II: No diagnosis    [name], LCSW 04/09/2019

## 2019-04-09 NOTE — Telephone Encounter (Signed)
Pt calls and states the pain in her R shoulder and arm and she feels she needs a scan of this, states she cant take much more. Advised if she needs to, to go to urg care or ED this weekend

## 2019-04-09 NOTE — Telephone Encounter (Signed)
Pt is still having pain in neck and shoulder, pt is asking what should she do [phone]

## 2019-04-11 ENCOUNTER — Other Ambulatory Visit: Payer: Self-pay | Admitting: Pulmonary Disease

## 2019-04-12 NOTE — Telephone Encounter (Signed)
If this is a new pain that is not and emergent process she would need to be evaluated to determine if imagining is needed. Thank you Bonnita Nasuti.

## 2019-04-13 NOTE — Telephone Encounter (Signed)
It is an ongoing pain. Will send to front desk for appt w/ pcp

## 2019-04-14 ENCOUNTER — Other Ambulatory Visit: Payer: Self-pay

## 2019-04-14 ENCOUNTER — Ambulatory Visit (INDEPENDENT_AMBULATORY_CARE_PROVIDER_SITE_OTHER): Payer: Medicare Other | Admitting: Pulmonary Disease

## 2019-04-14 ENCOUNTER — Encounter: Payer: Self-pay | Admitting: Pulmonary Disease

## 2019-04-14 ENCOUNTER — Other Ambulatory Visit (HOSPITAL_COMMUNITY): Payer: Self-pay | Admitting: *Deleted

## 2019-04-14 ENCOUNTER — Ambulatory Visit: Payer: Medicare Other | Admitting: Internal Medicine

## 2019-04-14 VITALS — BP 144/88 | HR 92 | Temp 97.2°F | Ht 71.0 in | Wt 347.8 lb

## 2019-04-14 DIAGNOSIS — F411 Generalized anxiety disorder: Secondary | ICD-10-CM

## 2019-04-14 DIAGNOSIS — G4733 Obstructive sleep apnea (adult) (pediatric): Secondary | ICD-10-CM | POA: Diagnosis not present

## 2019-04-14 DIAGNOSIS — R0602 Shortness of breath: Secondary | ICD-10-CM | POA: Diagnosis not present

## 2019-04-14 DIAGNOSIS — S22000A Wedge compression fracture of unspecified thoracic vertebra, initial encounter for closed fracture: Secondary | ICD-10-CM

## 2019-04-14 DIAGNOSIS — R1013 Epigastric pain: Secondary | ICD-10-CM

## 2019-04-14 DIAGNOSIS — F331 Major depressive disorder, recurrent, moderate: Secondary | ICD-10-CM

## 2019-04-14 DIAGNOSIS — J9611 Chronic respiratory failure with hypoxia: Secondary | ICD-10-CM

## 2019-04-14 MED ORDER — HYDROXYZINE PAMOATE 50 MG PO CAPS: 50.0000 mg | ORAL_CAPSULE | Freq: Every evening | ORAL | 0 refills | Status: DC | PRN

## 2019-04-14 MED ORDER — FAMOTIDINE 20 MG PO TABS: 20.0000 mg | ORAL_TABLET | Freq: Two times a day (BID) | ORAL | 0 refills | Status: DC

## 2019-04-14 MED ORDER — LIDOCAINE 5 % EX OINT: 1.0000 "application " | TOPICAL_OINTMENT | Freq: Two times a day (BID) | CUTANEOUS | 0 refills | Status: DC | PRN

## 2019-04-14 NOTE — Progress Notes (Signed)
  [facility] Internal Medicine Residency Telephone Encounter Continuity Care Appointment  HPI:   This telephone encounter was created for [name] on 04/14/2019 for the following purpose/cc neck, back, and shoulder pain.   Past Medical History:  Past Medical History:  Diagnosis Date  . Acid reflux   . Anemia    Iron Def  . Anorexia   . Chronic kidney disease    Nephrotic syndrome  . Colon polyp 2009  . Depression with anxiety   . Edema leg   . Fibromyalgia   . Hemorrhoids   . Hidradenitis suppurativa   . Hypertension   . IBS (irritable bowel syndrome)   . Low back pain   . Migraines   . Morbidly obese ([place])   . Neuromuscular disorder (HCC)    fibromyalgia  . Neuropathy   . Panic attacks   . Polyp of vocal cord or larynx   . Tonsil pain       ROS:  Review of Systems  Respiratory: Negative for cough.   Gastrointestinal: Positive for abdominal pain.  Musculoskeletal: Positive for back pain, joint pain and neck pain.  All other systems reviewed and are negative.     Assessment / Plan / Recommendations:   Please see A&P under problem oriented charting for assessment of the patient's acute and chronic medical conditions.   As always, pt is advised that if symptoms worsen or new symptoms arise, they should go to an urgent care facility or to to ER for further evaluation.   Consent and Medical Decision Making:   Patient discussed with Dr. Lynnae January  This is a telephone encounter between Lindsay [name] and Jeanmarie Hubert on 04/14/2019 for neck, back, and shoulder pain. The visit was conducted with the patient located at home and Jeanmarie Hubert at [facility]. The patient's identity was confirmed using their DOB and current address. The patient has consented to being evaluated through a telephone encounter and understands the associated risks (an examination cannot be done and the patient may need to come in for an appointment) / benefits (allows the patient to remain at  home, decreasing exposure to coronavirus). I personally spent 15 minutes on medical discussion.

## 2019-04-14 NOTE — Patient Instructions (Addendum)
Mild obstructive sleep apnea with significant daytime symptoms  We will start you on CPAP therapy through [facility]   CPAP settings 5-18  I will see you back in about 6 to 8 weeks  Call with significant concerns

## 2019-04-14 NOTE — Progress Notes (Signed)
Subjective:    Patient ID: Lindsay [name], female    DOB: 01-29-1970, 49 y.o.   MRN: [id number]  Patient with a history of obstructive sleep apnea  Recent sleep study 03/26/2019 showing mild obstructive sleep apnea I had the patient come in today to review her symptoms and review treatment options  She does have significant daytime sleepiness, significant fatigue, nonrestorative sleep I believe she will benefit from CPAP treatment  She does have some shortness of breath with activity Does have a lot of musculoskeletal pains and discomfort She is limited with activities of daily living  Active smoker On chronic home O2 Was in the hospital in December with a pneumonia  History of PTSD History of depression  PFT in the past did reveal obstructive disease Previous echocardiogram showed normal systolic function Previous sleep study from 2016 did reveal evidence of sleep apnea   Review of Systems  Constitutional: Negative for fever and unexpected weight change.  HENT: Positive for dental problem, ear pain and sinus pressure. Negative for congestion, nosebleeds, postnasal drip, rhinorrhea, sneezing, sore throat and trouble swallowing.   Eyes: Negative for redness and itching.  Respiratory: Positive for cough and shortness of breath. Negative for chest tightness and wheezing.   Cardiovascular: Positive for palpitations and leg swelling.  Gastrointestinal: Positive for nausea. Negative for vomiting.  Genitourinary: Negative for dysuria.  Musculoskeletal: Positive for back pain. Negative for joint swelling.  Skin: Negative for rash.  Allergic/Immunologic: Positive for environmental allergies. Negative for food allergies and immunocompromised state.  Neurological: Negative for headaches.  Hematological: Does not bruise/bleed easily.  Psychiatric/Behavioral: Negative for dysphoric mood. The patient is not nervous/anxious.    Past Medical History:  Diagnosis Date  . Acid reflux   .  Anemia    Iron Def  . Anorexia   . Chronic kidney disease    Nephrotic syndrome  . Colon polyp 2009  . Depression with anxiety   . Edema leg   . Fibromyalgia   . Hemorrhoids   . Hidradenitis suppurativa   . Hypertension   . IBS (irritable bowel syndrome)   . Low back pain   . Migraines   . Morbidly obese ([place])   . Neuromuscular disorder (HCC)    fibromyalgia  . Neuropathy   . Panic attacks   . Polyp of vocal cord or larynx   . Tonsil pain    Social History   Socioeconomic History  . Marital status: Single    Spouse name: Not on file  . Number of children: 1  . Years of education: 71  . Highest education level: Not on file  Occupational History  . Occupation: Disabled  Tobacco Use  . Smoking status: Current Every Day Smoker    Packs/day: 0.10    Years: 25.00    Pack years: 2.50    Types: Cigarettes  . Smokeless tobacco: Never Used  . Tobacco comment: DOWN TO 1 A DAY  Substance and Sexual Activity  . Alcohol use: Not Currently    Alcohol/week: 0.0 standard drinks    Comment: none sine 05/2018  . Drug use: Not Currently    Frequency: 3.0 times per week    Types: Marijuana  . Sexual activity: Not Currently    Birth control/protection: Implant  Other Topics Concern  . Not on file  Social History Narrative   Lives at home w/ her mother and daughter   Right-handed   Caffeine: about 3 Cokes per week   Social Determinants of Health  Financial Resource Strain:   . Difficulty of Paying Living Expenses:   Food Insecurity:   . Worried About Charity fundraiser in the Last Year:   . Arboriculturist in the Last Year:   Transportation Needs:   . Film/video editor (Medical):   [name] Lack of Transportation (Non-Medical):   Physical Activity:   . Days of Exercise per Week:   . Minutes of Exercise per Session:   Stress:   . Feeling of Stress :   Social Connections:   . Frequency of Communication with Friends and Family:   . Frequency of Social Gatherings with  Friends and Family:   . Attends Religious Services:   . Active Member of Clubs or Organizations:   . Attends Archivist Meetings:   [name] Marital Status:   Intimate Partner Violence:   . Fear of Current or Ex-Partner:   . Emotionally Abused:   [name] Physically Abused:   . Sexually Abused:    Family History  Problem Relation Age of Onset  . Diabetes Mother   . Heart disease Mother        valve leak  . Anxiety disorder Mother   . Depression Mother   . High blood pressure Mother   . Kidney failure Mother   . Cancer Father        prostate  . Heart disease Father   . Learning disabilities Sister   . Depression Sister   . Depression Sister   . Anxiety disorder Sister   . Colon cancer Neg Hx        Objective:   Physical Exam Constitutional:      Appearance: Normal appearance.  HENT:     Head: Normocephalic and atraumatic.     Mouth/Throat:     Comments: Crowded oropharynx, macroglossia, Mallampati 4 Eyes:     Extraocular Movements: Extraocular movements intact.     Pupils: Pupils are equal, round, and reactive to light.  Cardiovascular:     Rate and Rhythm: Normal rate and regular rhythm.     Pulses: Normal pulses.     Heart sounds: Normal heart sounds. No murmur.  Pulmonary:     Effort: Pulmonary effort is normal. No respiratory distress.     Breath sounds: Normal breath sounds. No stridor. No wheezing or rhonchi.  Musculoskeletal:        General: Swelling present. Normal range of motion.     Cervical back: Normal range of motion and neck supple.     Right lower leg: Edema present.     Left lower leg: Edema present.  Neurological:     Mental Status: She is alert.     Cranial Nerves: No cranial nerve deficit.     Sensory: No sensory deficit.    Vitals:   04/14/19 0847  BP: (!) 144/88  Pulse: 92  Temp: (!) 97.2 F (36.2 C)  SpO2: 94%   Echo from 2020 did reveal normal systolic function Polysomnogram 2016-moderate obstructive sleep apnea    Assessment &  Plan:  Chronic respiratory failure -Likely multifactorial -Underlying COPD -Recent echo was unremarkable -Continue oxygen supplementation with activity  .  Obstructive lung disease -Bronchodilators -Symbicort-encouraged to continue using on a regular basis -Nebulizer use  Obstructive sleep apnea -Recent sleep study did reveal mild obstructive sleep apnea -Last study in 2016 did reveal moderate obstructive sleep apnea -Patient still has significant daytime symptoms -Check excessive fatigue, tiredness, nonrestorative sleep  .  History of depression .  History  of PTSD  Plan: Smoking cessation counseling Continue oxygen supplementation  Sleep study showing mild obstructive sleep apnea with significant daytime symptoms Patient will benefit from CPAP therapy  We will initiate CPAP 5-18  The most significant change in medications losing weight and quitting smoking Encouraged to call with any significant concerns  Multifactorial reasons for shortness of breath  Will follow-up in 6 to 8 weeks

## 2019-04-14 NOTE — Addendum Note (Signed)
Addended by: Tery Sanfilippo R on: 04/14/2019 09:34 AM   Modules accepted: Orders

## 2019-04-14 NOTE — Assessment & Plan Note (Addendum)
Patient reports continued pain in her neck, back, right shoulder.  Patient reports that she has some numbness and tingling in her bilateral hands and has been dropping things as she has difficulty feeling them.  Patient's neck pain is worse with neck movement, radiates partially down her right arm, not all the way into the fingers.  Patient has neuro rehab evaluation scheduled for tomorrow.  Location of patient's T3 compression fracture does not explain patient's symptoms.  Patient's new onset of numbness, possible weakness is concerning and would benefit from in person evaluation.  I have concern for nerve compression injury.  Patient shoulder pain may also be secondary to rotator cuff injury.  Plan: *Will evaluate patient at next available appointment *Lidocaine as needed for neck pain *Narcotic pain medications not indicated at this time. *Patient was previously seen at pain clinic but was not able to be initiated on opiate therapy due to benzodiazepine psychiatric medication.  Patient has been off this medication for over 1 month

## 2019-04-14 NOTE — Assessment & Plan Note (Signed)
Patient reports persistent abdominal pain as before.  Patient was evaluated in the emergency room with a CT abdomen/pelvis which did not reveal any acute finding.  She has been referred to [facility] and is scheduling appointment with them.  Plan: Patient pain may be secondary to acute gastritis.  Patient is on omeprazole and has occasional acid reflux symptoms. *Continue omeprazole *Prescription sent for famotidine

## 2019-04-15 ENCOUNTER — Ambulatory Visit: Payer: Medicare Other | Admitting: Physical Therapy

## 2019-04-15 NOTE — Progress Notes (Signed)
Internal Medicine Clinic Attending  Case discussed with Dr. MacLean at the time of the visit.  We reviewed the resident's history and exam and pertinent patient test results.  I agree with the assessment, diagnosis, and plan of care documented in the resident's note.    

## 2019-04-16 NOTE — Telephone Encounter (Signed)
Patient called back and sch an appt for the [facility] on 04/21/2019 @ 9:15 am.

## 2019-04-16 NOTE — Telephone Encounter (Signed)
Spoke with the patient this morning.  She states she will call back because she will have to call [facility] transportation to arrange her visit.

## 2019-04-20 DIAGNOSIS — G4733 Obstructive sleep apnea (adult) (pediatric): Secondary | ICD-10-CM | POA: Diagnosis not present

## 2019-04-21 ENCOUNTER — Ambulatory Visit (INDEPENDENT_AMBULATORY_CARE_PROVIDER_SITE_OTHER): Payer: Medicare Other | Admitting: Internal Medicine

## 2019-04-21 ENCOUNTER — Telehealth: Payer: Self-pay | Admitting: Internal Medicine

## 2019-04-21 ENCOUNTER — Other Ambulatory Visit: Payer: Self-pay

## 2019-04-21 ENCOUNTER — Encounter: Payer: Self-pay | Admitting: Internal Medicine

## 2019-04-21 VITALS — BP 146/104 | HR 90 | Temp 98.6°F | Ht 71.0 in | Wt 245.9 lb

## 2019-04-21 DIAGNOSIS — M4854XD Collapsed vertebra, not elsewhere classified, thoracic region, subsequent encounter for fracture with routine healing: Secondary | ICD-10-CM | POA: Diagnosis not present

## 2019-04-21 DIAGNOSIS — M25511 Pain in right shoulder: Secondary | ICD-10-CM

## 2019-04-21 MED ORDER — HYDROCODONE-ACETAMINOPHEN 7.5-325 MG PO TABS: 1.0000 | ORAL_TABLET | Freq: Three times a day (TID) | ORAL | 0 refills | Status: DC | PRN

## 2019-04-21 MED ORDER — NAPROXEN 500 MG PO TABS: 500.0000 mg | ORAL_TABLET | Freq: Two times a day (BID) | ORAL | 0 refills | Status: DC

## 2019-04-21 NOTE — Patient Instructions (Addendum)
Thank you for trusting me with your care. To recap, today we discussed the following:  Right shoulder pain - I will reach out to Dr.Wang's office and ask him to give a shoulder injection tomorrow.  - You have PT schedule next week for your shoulder so I will not send another referral. - I sent you a prescription of Naproxen , you can start after your procedure tomorrow if Dr.Wang is unable to give you a shoulder injection.    My best,  Tamsen Snider, MD

## 2019-04-21 NOTE — Telephone Encounter (Signed)
Patient requesting a phone call back in reference to not being able to receive her injection tomorrow.  Pt would like to know what to do now .  Patient states she was sch for an appt with Dr. Normajean Glasgow to be performed at the [facility].  Per Office please contact the [facility] directly @ [phone] Ext [phone] and speak with Kennyth Lose or Suanne Marker in reference to the cancellation Reason.

## 2019-04-21 NOTE — Progress Notes (Signed)
   CC: right shoulder pain  HPI:Ms.Seleena Delprado is a 49 y.o. female who presents for evaluation of right shoulder pain. Please see individual problem based A/P for details.  Past Medical History:  Diagnosis Date  . Acid reflux   . Anemia    Iron Def  . Anorexia   . Chronic kidney disease    Nephrotic syndrome  . Colon polyp 2009  . Depression with anxiety   . Edema leg   . Fibromyalgia   . Hemorrhoids   . Hidradenitis suppurativa   . Hypertension   . IBS (irritable bowel syndrome)   . Low back pain   . Migraines   . Morbidly obese ([place])   . Neuromuscular disorder (HCC)    fibromyalgia  . Neuropathy   . Panic attacks   . Polyp of vocal cord or larynx   . Tonsil pain    Review of Systems:  ROS negative except as per HPI.  Physical Exam: Vitals:   04/21/19 0908  BP: (!) 146/104  Pulse: 90  Temp: 98.6 F (37 C)  TempSrc: Oral  SpO2: 96%  Weight: 245 lb 14.4 oz (111.5 kg)  Height: 5\' 11"  (1.803 m)    General: Obese, chronically ill appearing  HEENT: Normocephalic, atraumatic , EOMI, Conjunctivae normal Cardiovascular: Normal rate, regular rhythm.  No murmurs, rubs, or gallops Pulmonary : Effort normal, diminished breath sounds, No wheezes, rales, or rhonchi Abdominal: soft, nontender,  bowel sounds present Skin: Warm, dry , no bruising, erythema, or rash  Shoulder: No obvious deformity or asymmetry. No bruising. No swelling, TTP Full ROM in flexion, abduction, internal/external rotation NV intact distally Special Tests:  - Impingement: positive Hawkins and Neers.  - Supraspinatus: 4/5 strength on right side with empty can, limited by pain in anterior shoulder.  5/5 strength on left - Infraspinatus/Teres: 5/5 strength with ER - Subscapularis: 5/5 strength with IR - Biceps tendon: Negative Speeds.  - Labrum: Negative Obriens.  - Drop arm sign negative    Assessment & Plan:   See Encounters Tab for problem based charting.  Patient discussed with Dr.  Daryll Drown

## 2019-04-21 NOTE — Assessment & Plan Note (Signed)
Right Shoulder Pain: Patient says this pain started in about 3 months ago and she went to the hospital for this pain in February. On review she was admitted February 6th to [facility] for acute hypoxic respiratory failure and also found to have a T3 compression fracture. Lidocaine cream was initially helping, but no longer gives her relief. She describes the pain as sharp pain which radiates to neck. Movement worsens pain. Tylenol doesn't help. Shoulder exam suggest rotator cuff impingement, anterior should pain elicited with Neer's and Hawkins test. Negative drop arm sign. Patient has been using her Rolator more and likely has caused tendinitis. Scheduled for spine injections tomorrow with Dr.Wang, [facility], will reach out to office to see if she can receive shoulder injection tomorrow as well. Plan: - Naproxen 500 mg BID x 2 weeks - Patient already has referral to PT  Addendum: After visit , I called Dr.Wang's office to request patient be evaluated for shoulder injection tomorrow as well. After lunch received message patient procedure cancelled after pre-op anesthesia evaluation. Spoke to [name] and she conveyed patient was deemed a high risk candidate. Her procedure would need to be scheduled at the hospital. Called patient and patient continues to have uncontrolled pain in her back and shoulder.  Plan: For T3 compression fracture, PRN Norco 7.5-325 TID x 5 days for acute pain, recommended following up with Ortho to have procedure rescheduled - Continue treatment for should as above

## 2019-04-22 ENCOUNTER — Other Ambulatory Visit: Payer: Self-pay | Admitting: Physical Medicine and Rehabilitation

## 2019-04-22 ENCOUNTER — Telehealth: Payer: Self-pay | Admitting: Pulmonary Disease

## 2019-04-22 DIAGNOSIS — J9611 Chronic respiratory failure with hypoxia: Secondary | ICD-10-CM

## 2019-04-22 DIAGNOSIS — M4306 Spondylolysis, lumbar region: Secondary | ICD-10-CM

## 2019-04-22 DIAGNOSIS — G4733 Obstructive sleep apnea (adult) (pediatric): Secondary | ICD-10-CM

## 2019-04-22 NOTE — Telephone Encounter (Signed)
I spoke to [facility] yesterday and patient, documented in note.

## 2019-04-22 NOTE — Telephone Encounter (Signed)
I have placed an order to add oxygen in with CPAP per Melissa with [employer].

## 2019-04-22 NOTE — Progress Notes (Signed)
Internal Medicine Clinic Attending ° °Case discussed with Dr. Steen  at the time of the visit.  We reviewed the resident’s history and exam and pertinent patient test results.  I agree with the assessment, diagnosis, and plan of care documented in the resident’s note.  °

## 2019-04-23 ENCOUNTER — Encounter: Payer: Self-pay | Admitting: Internal Medicine

## 2019-04-26 ENCOUNTER — Other Ambulatory Visit: Payer: Self-pay | Admitting: Physical Medicine and Rehabilitation

## 2019-04-26 ENCOUNTER — Ambulatory Visit
Admission: RE | Admit: 2019-04-26 | Discharge: 2019-04-26 | Disposition: A | Discharge status: Home / Self Care | Payer: Medicare Other | Source: Ambulatory Visit | Attending: Physical Medicine and Rehabilitation | Admitting: Physical Medicine and Rehabilitation

## 2019-04-26 ENCOUNTER — Other Ambulatory Visit: Payer: Self-pay

## 2019-04-26 DIAGNOSIS — M4306 Spondylolysis, lumbar region: Secondary | ICD-10-CM

## 2019-04-26 DIAGNOSIS — M545 Low back pain: Secondary | ICD-10-CM | POA: Diagnosis not present

## 2019-04-26 IMAGING — XA DG FACET JT INJ L OR S SPINE SINGLE LEVEL UNI
7 series · 7 of 7 positions shown · non-contrast
Comparison: none

CLINICAL DATA: Chronic low back pain, possibly facet mediated.

[Series 1: ortho adipose · 1 of 1 slices shown (1 of 7)]
[im 1/1]
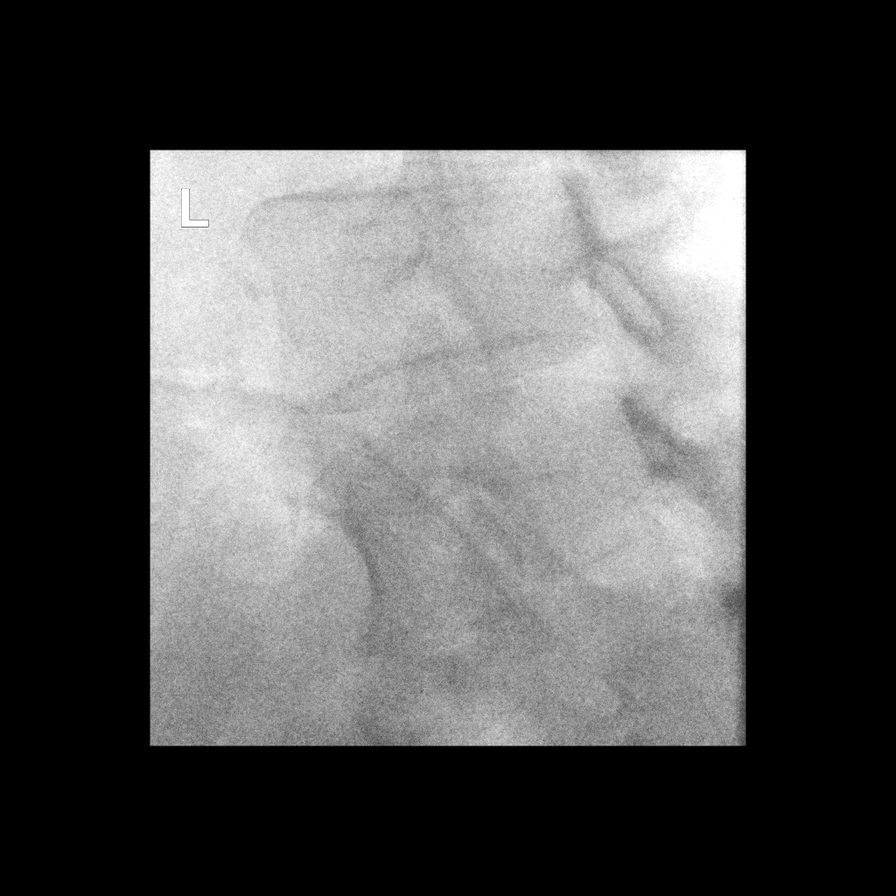

[Series 2: ortho adipose · 1 of 1 slices shown (2 of 7)]
[im 1/1]
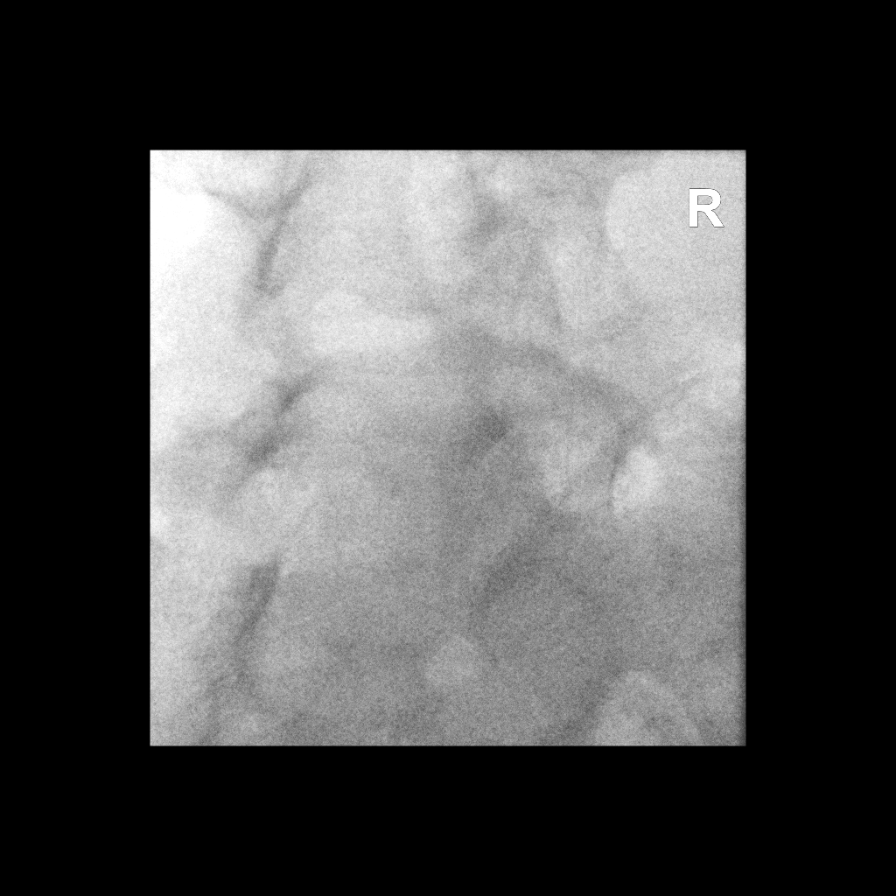

[Series 3: ortho adipose · 1 of 1 slices shown (3 of 7)]
[im 1/1]
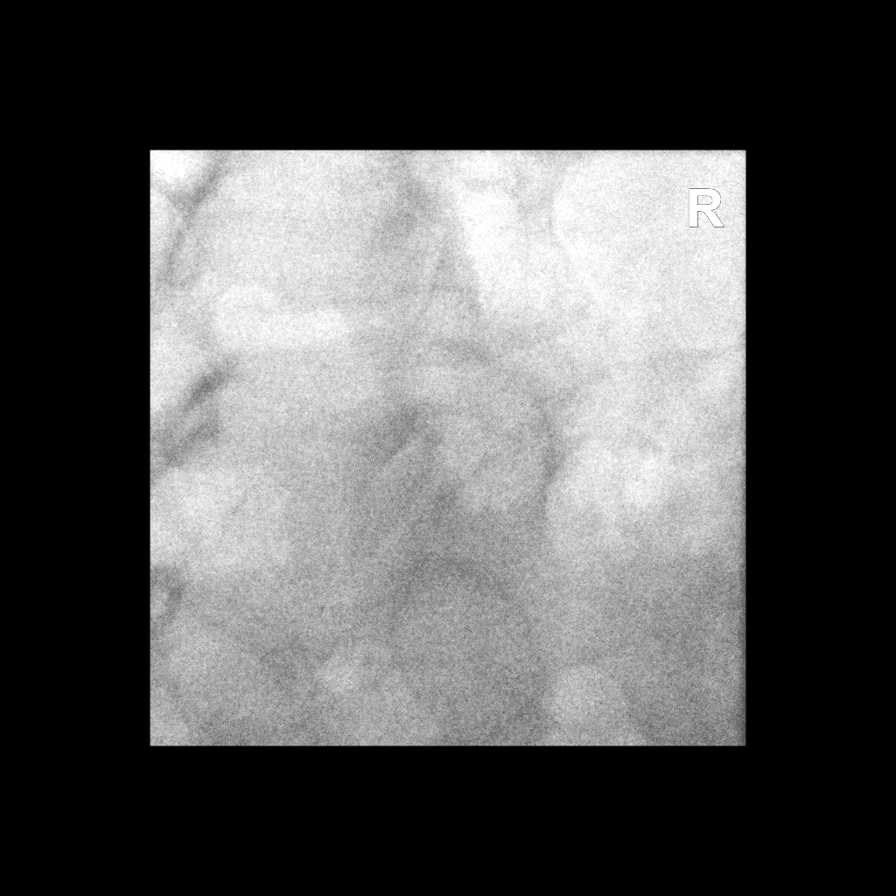

[Series 4: ortho adipose · 1 of 1 slices shown (4 of 7)]
[im 1/1]
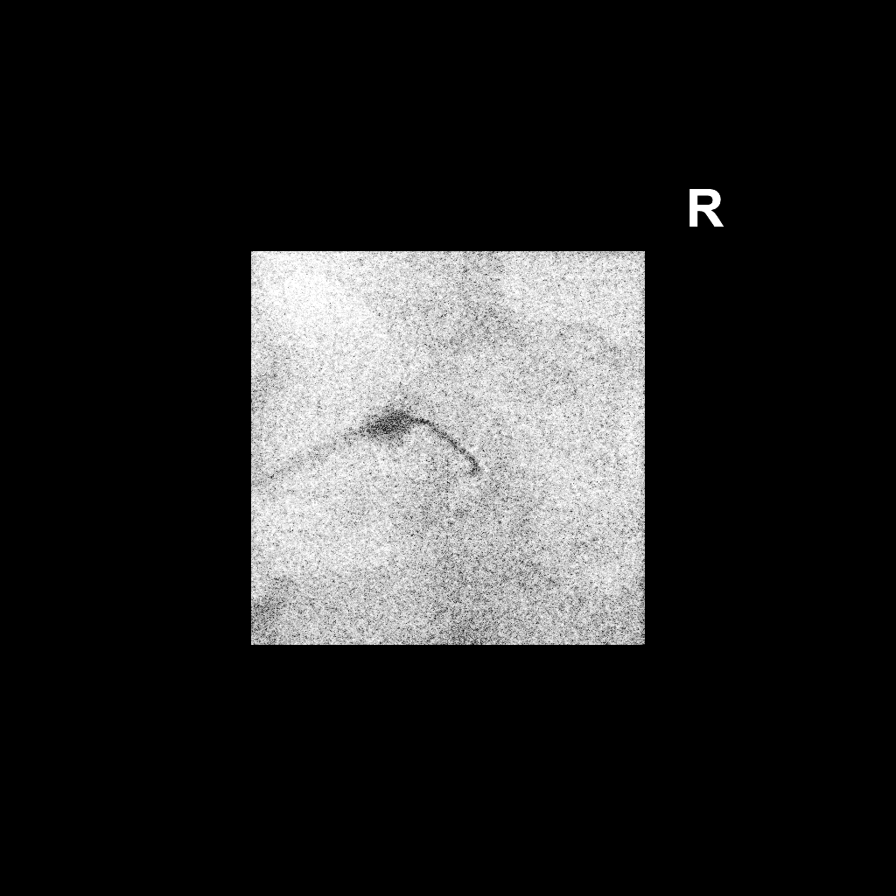

[Series 5: ortho adipose · 1 of 1 slices shown (5 of 7)]
[im 1/1]
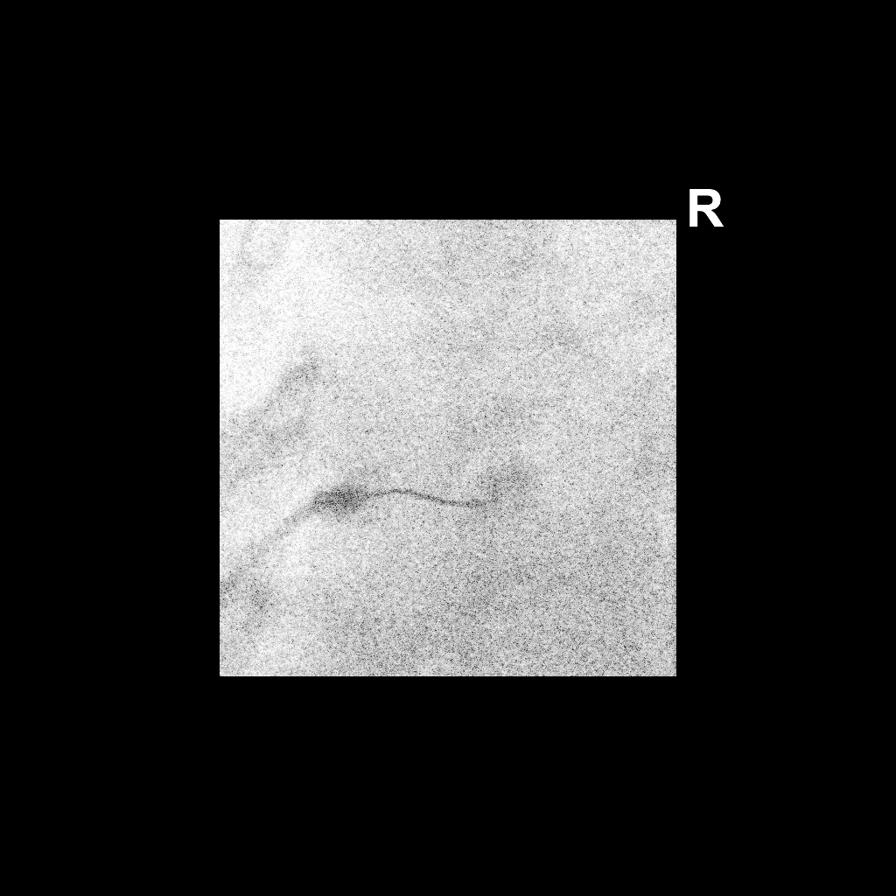

[Series 6: ortho adipose · 1 of 1 slices shown (6 of 7)]
[im 1/1]
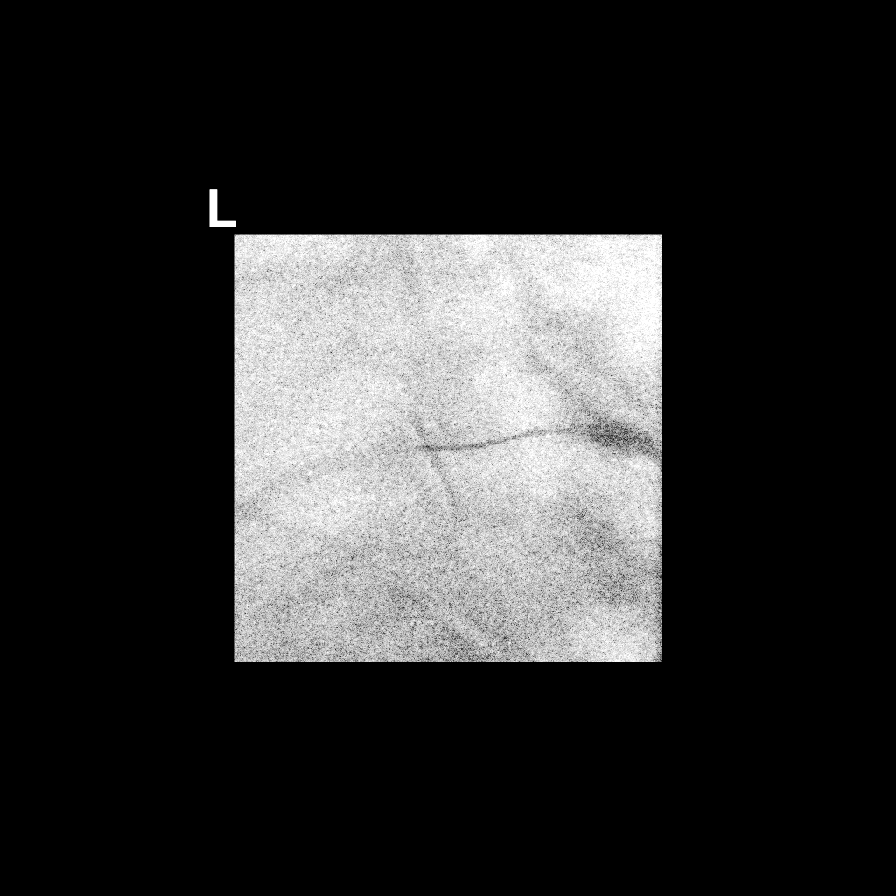

[Series 7: ortho adipose · 1 of 1 slices shown (7 of 7)]
[im 1/1]
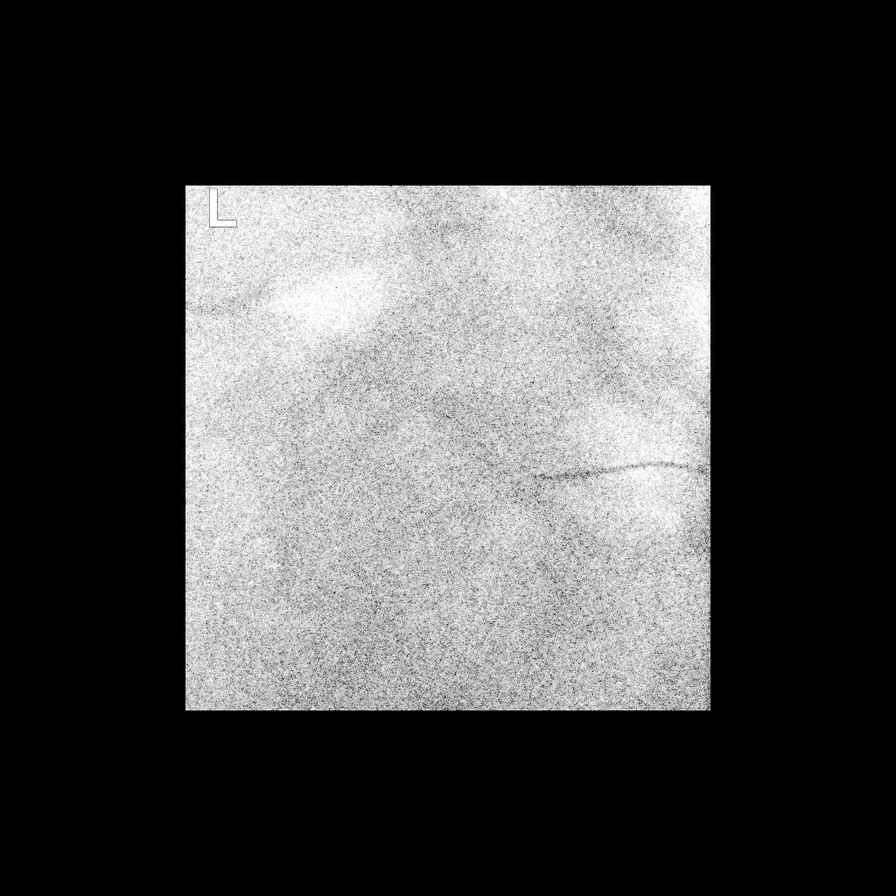

[7 of 7 positions shown; findings below may reference images not displayed]

EXAM:
FLUOROSCOPICALLY GUIDED BILATERAL L4-L5 AND L5-S1 FACET INJECTIONS

FLUOROSCOPY TIME:  Radiation Exposure Index (as provided by the
fluoroscopic device): 32.7 mGy

Fluoroscopy Time:  1 minute, 23 seconds

Number of Acquired Images:  3

PROCEDURE:
The procedure, risks, benefits, and alternatives were explained to
the patient. Questions regarding the procedure were encouraged and
answered. The patient understands and consents to the procedure.

BILATERAL L4-L5 FACET INJECTIONS: A posterior oblique approach was
taken to each facet at L4-L5 using curved 6 inch 22 gauge spinal
needles. Intra-articular/juxta-articular positioning was confirmed
by injecting a small amount of Isovue-M 200. No vascular
opacification is seen. 30 mg of Depo-Medrol mixed with 1 mL of 0.25%
bupivacaine were instilled into each joint. The injection resulted
in concordant pain.

BILATERAL L5-S1 FACET INJECTIONS: A posterior oblique approach was
taken to each facet at L5-S1 using curved 6 inch 22 gauge spinal
needles. Intra-articular/juxta-articular positioning was confirmed
by injecting a small amount of Isovue-M 200. No vascular
opacification is seen. 30 mg of Depo-Medrol mixed with 1 mL of 0.25%
bupivacaine were instilled into each joint. The injection resulted
in concordant pain.

The procedure was well-tolerated.
IMPRESSION: Technically successful bilateral L4-L5 and L5-S1 facet injections.

## 2019-04-26 IMAGING — XA DG FACET JT INJ L OR S SPINE SINGLE LEVEL UNI
7 series · 7 of 7 positions shown · non-contrast
Comparison: none

CLINICAL DATA: Chronic low back pain, possibly facet mediated.

[Series 1: ortho adipose · 1 of 1 slices shown (1 of 7)]
[im 1/1]
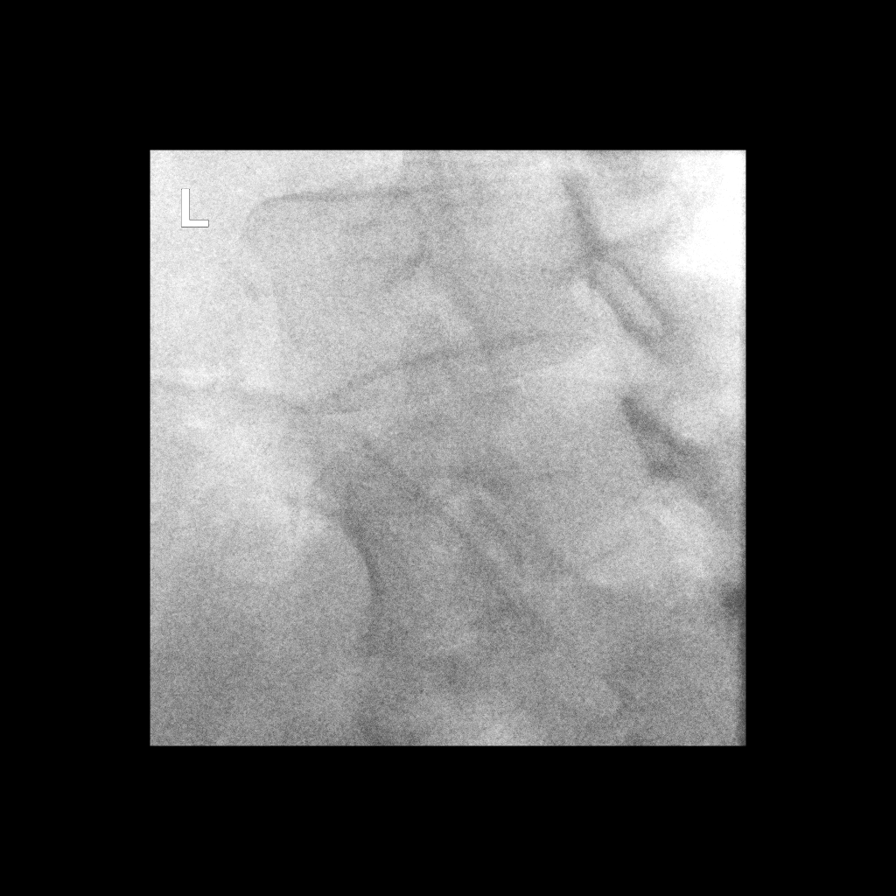

[Series 2: ortho adipose · 1 of 1 slices shown (2 of 7)]
[im 1/1]
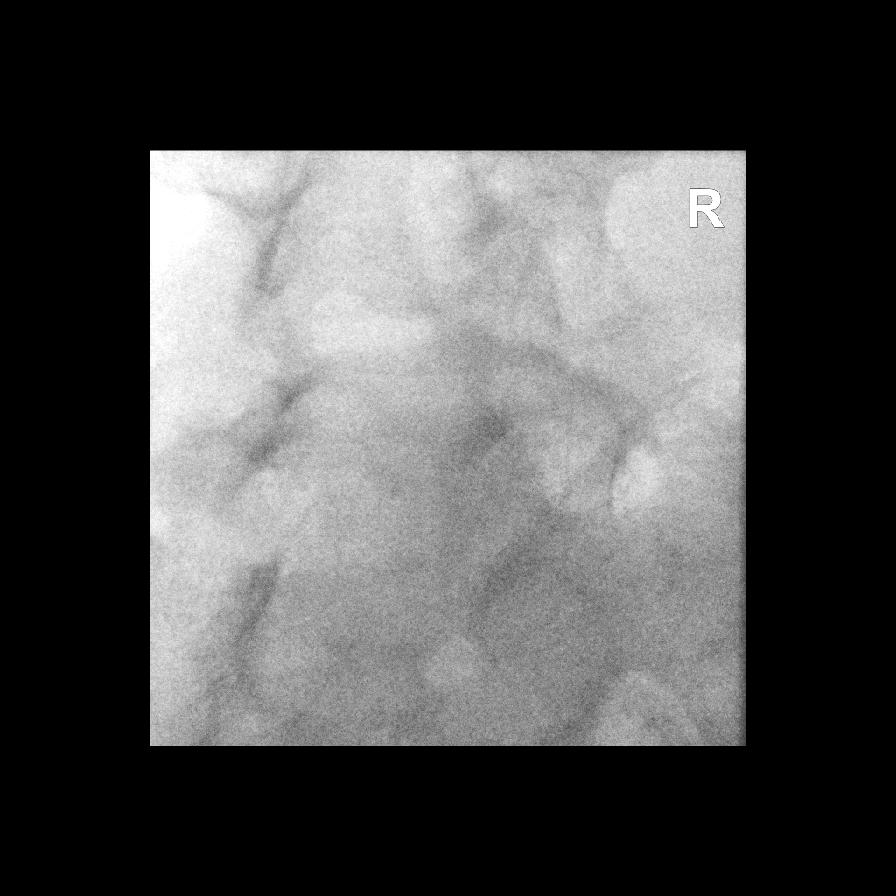

[Series 3: ortho adipose · 1 of 1 slices shown (3 of 7)]
[im 1/1]
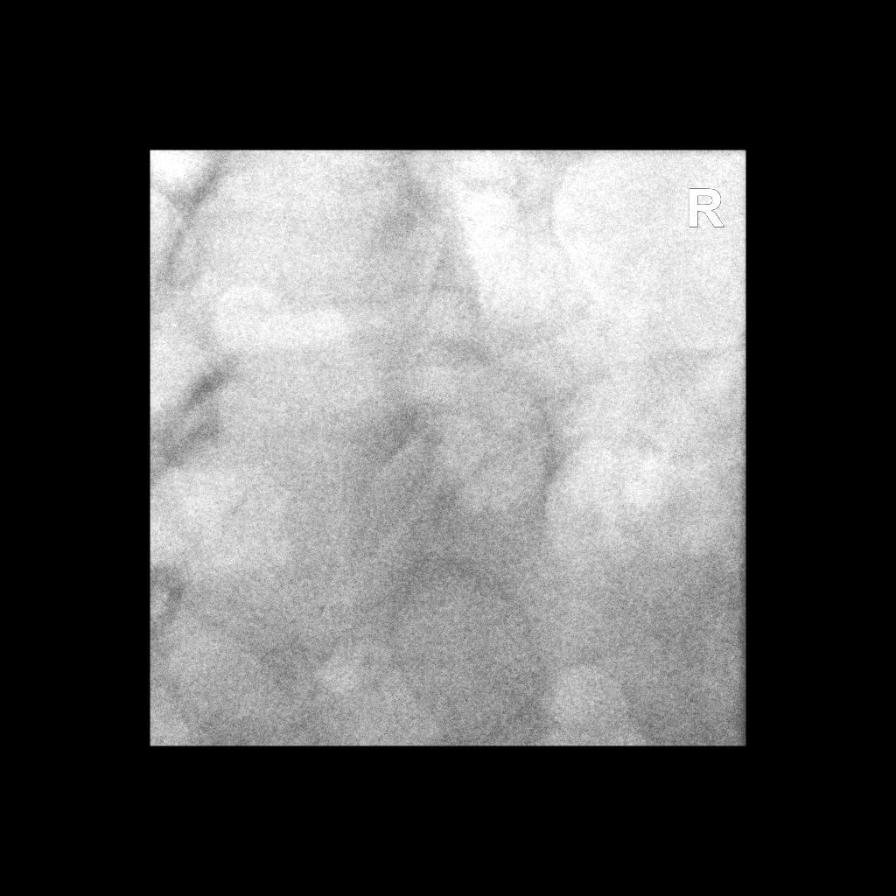

[Series 4: ortho adipose · 1 of 1 slices shown (4 of 7)]
[im 1/1]
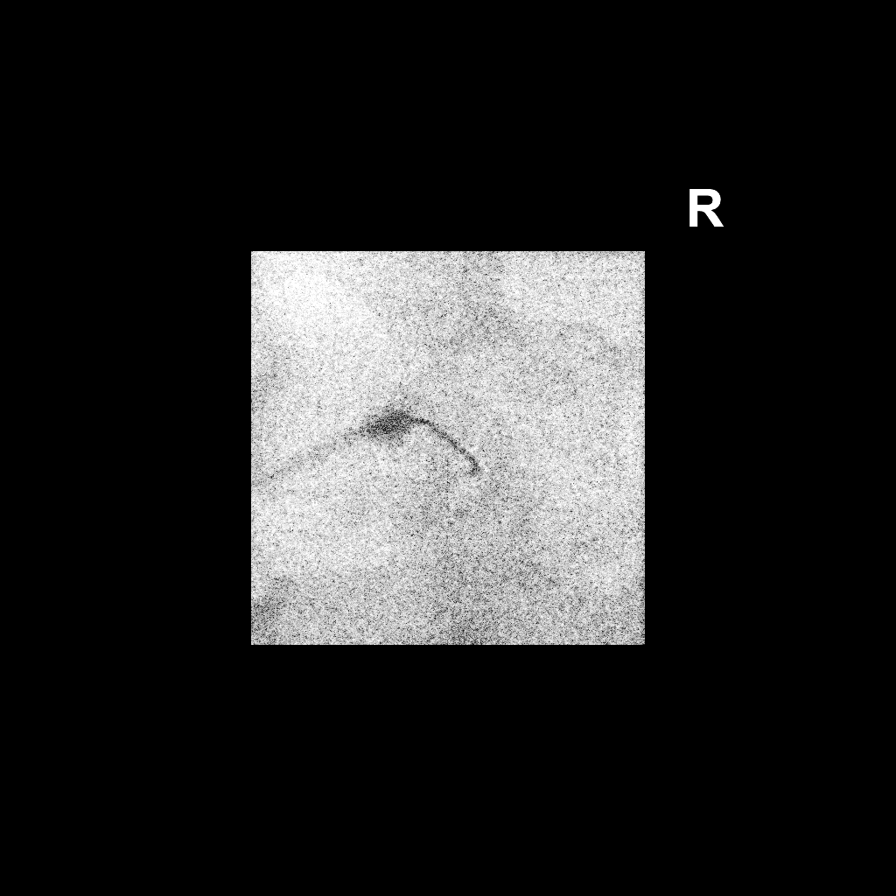

[Series 5: ortho adipose · 1 of 1 slices shown (5 of 7)]
[im 1/1]
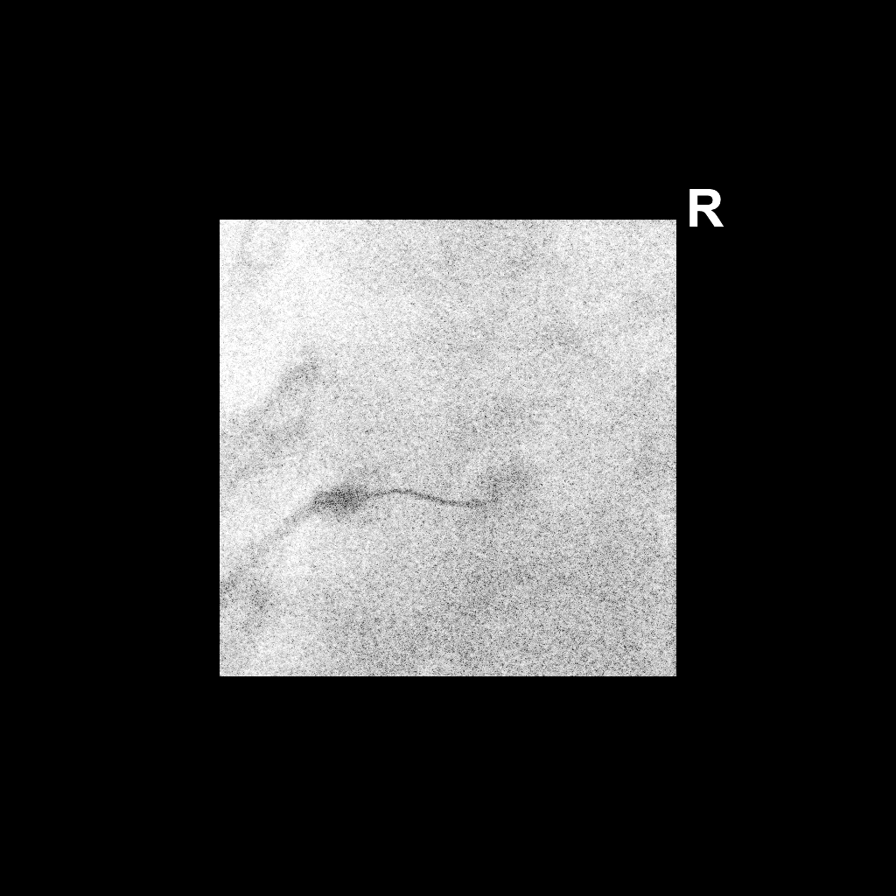

[Series 6: ortho adipose · 1 of 1 slices shown (6 of 7)]
[im 1/1]
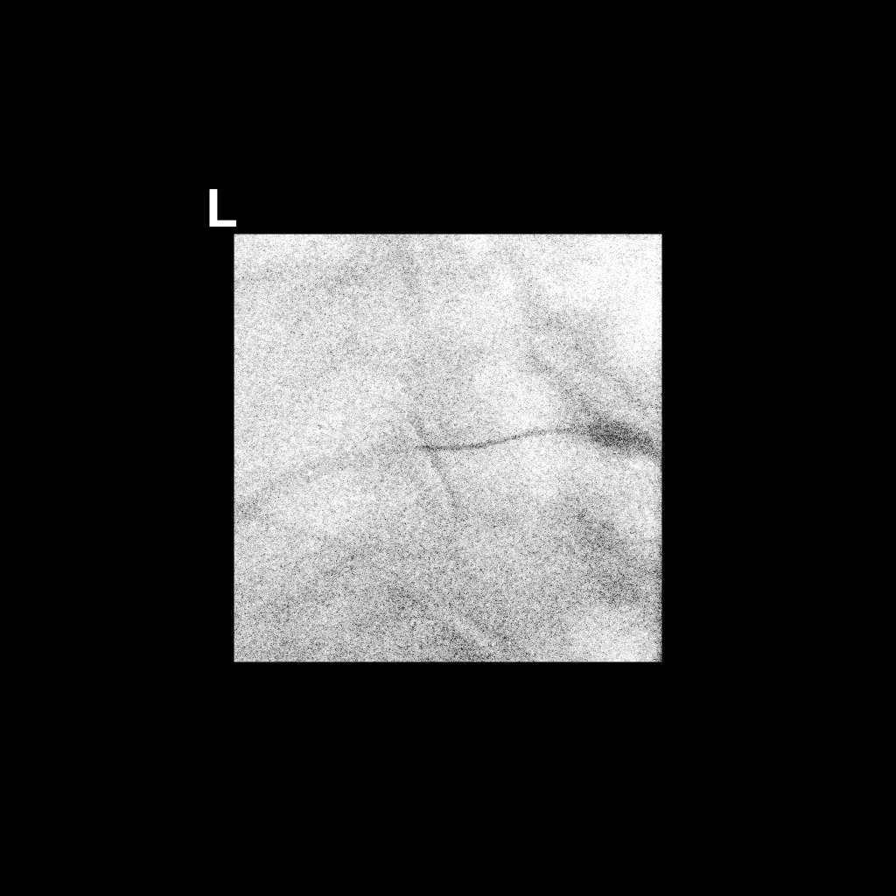

[Series 7: ortho adipose · 1 of 1 slices shown (7 of 7)]
[im 1/1]
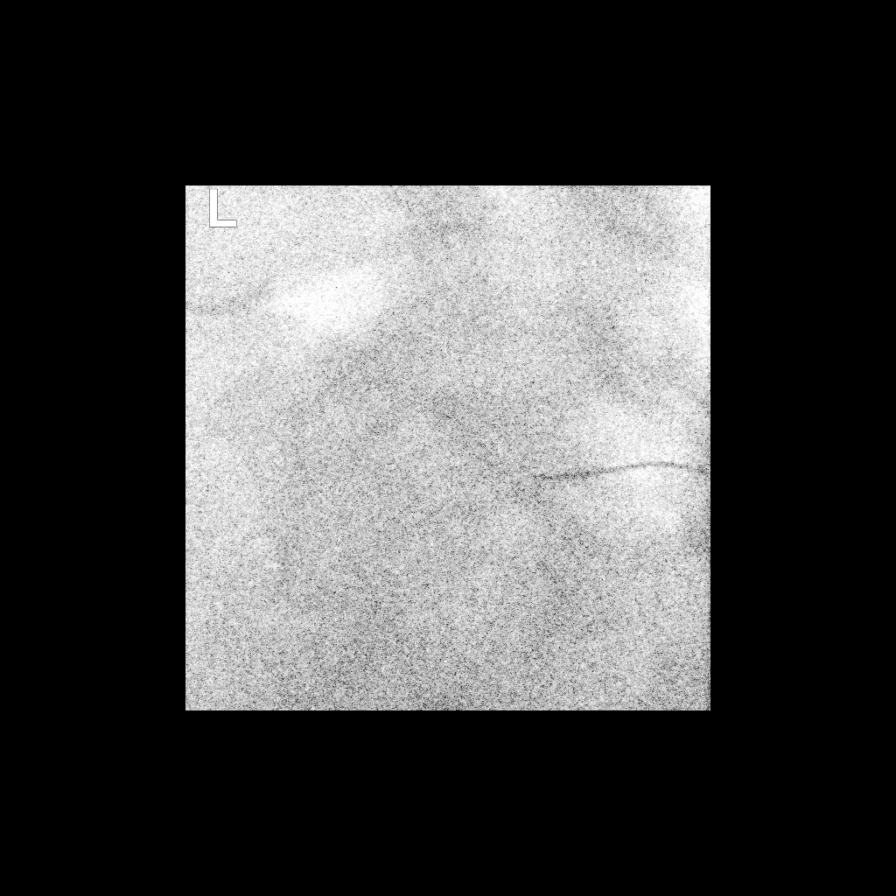

[7 of 7 positions shown; findings below may reference images not displayed]

EXAM:
FLUOROSCOPICALLY GUIDED BILATERAL L4-L5 AND L5-S1 FACET INJECTIONS

FLUOROSCOPY TIME:  Radiation Exposure Index (as provided by the
fluoroscopic device): 32.7 mGy

Fluoroscopy Time:  1 minute, 23 seconds

Number of Acquired Images:  3

PROCEDURE:
The procedure, risks, benefits, and alternatives were explained to
the patient. Questions regarding the procedure were encouraged and
answered. The patient understands and consents to the procedure.

BILATERAL L4-L5 FACET INJECTIONS: A posterior oblique approach was
taken to each facet at L4-L5 using curved 6 inch 22 gauge spinal
needles. Intra-articular/juxta-articular positioning was confirmed
by injecting a small amount of Isovue-M 200. No vascular
opacification is seen. 30 mg of Depo-Medrol mixed with 1 mL of 0.25%
bupivacaine were instilled into each joint. The injection resulted
in concordant pain.

BILATERAL L5-S1 FACET INJECTIONS: A posterior oblique approach was
taken to each facet at L5-S1 using curved 6 inch 22 gauge spinal
needles. Intra-articular/juxta-articular positioning was confirmed
by injecting a small amount of Isovue-M 200. No vascular
opacification is seen. 30 mg of Depo-Medrol mixed with 1 mL of 0.25%
bupivacaine were instilled into each joint. The injection resulted
in concordant pain.

The procedure was well-tolerated.
IMPRESSION: Technically successful bilateral L4-L5 and L5-S1 facet injections.

## 2019-04-26 IMAGING — XA DG FACET JT INJ L OR S SPINE SECOND LEVEL UNILATERAL
7 series · 7 of 7 positions shown · non-contrast
Comparison: none

CLINICAL DATA: Chronic low back pain, possibly facet mediated.

[Series 1: ortho adipose · 1 of 1 slices shown (1 of 7)]
[im 1/1]
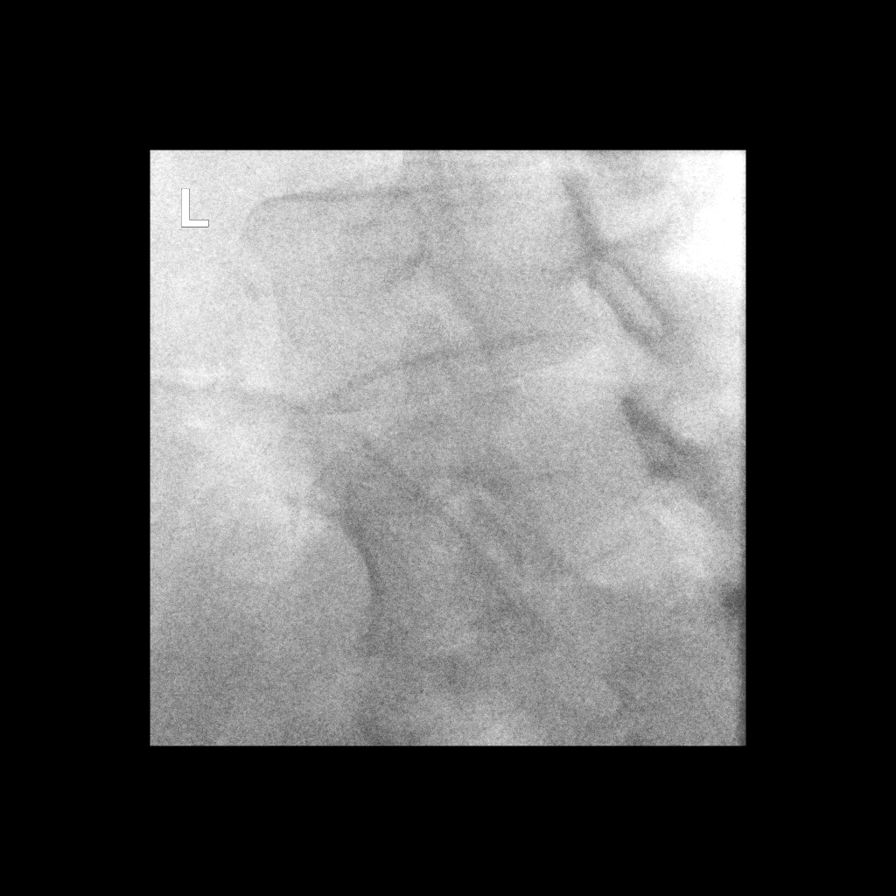

[Series 2: ortho adipose · 1 of 1 slices shown (2 of 7)]
[im 1/1]
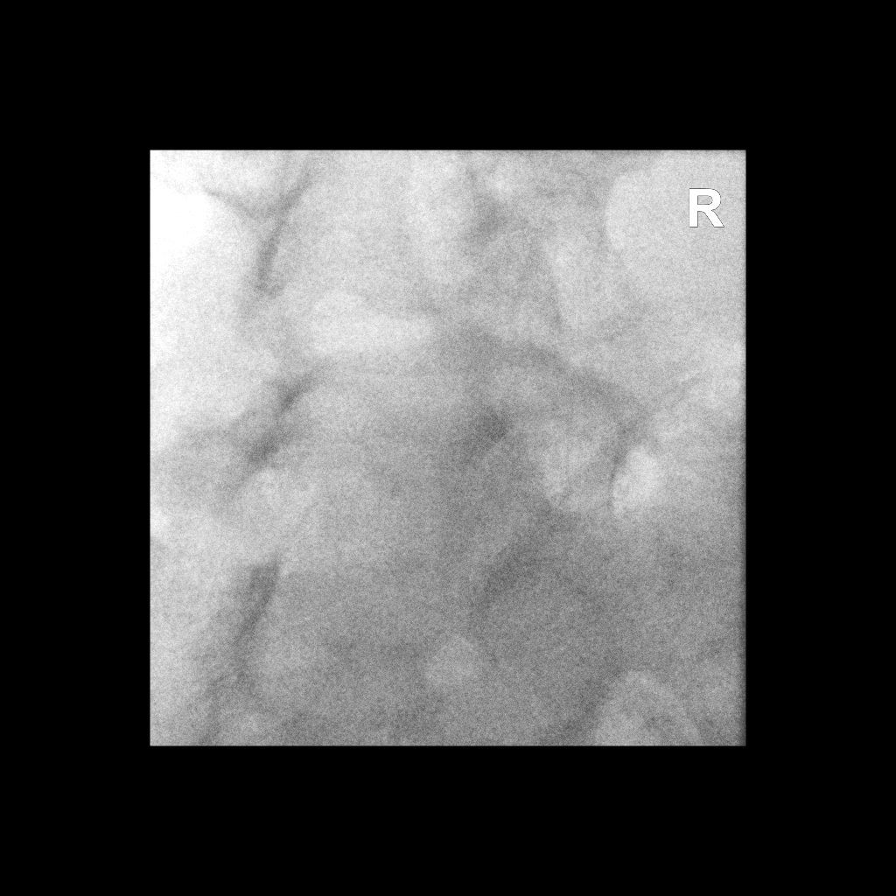

[Series 3: ortho adipose · 1 of 1 slices shown (3 of 7)]
[im 1/1]
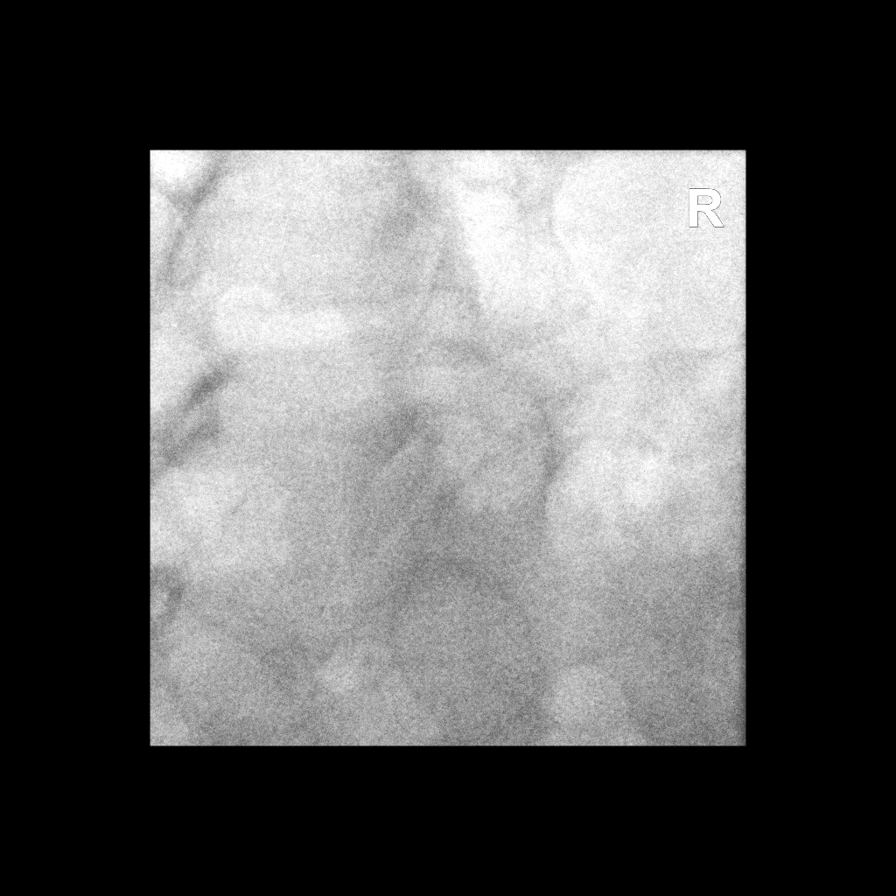

[Series 4: ortho adipose · 1 of 1 slices shown (4 of 7)]
[im 1/1]
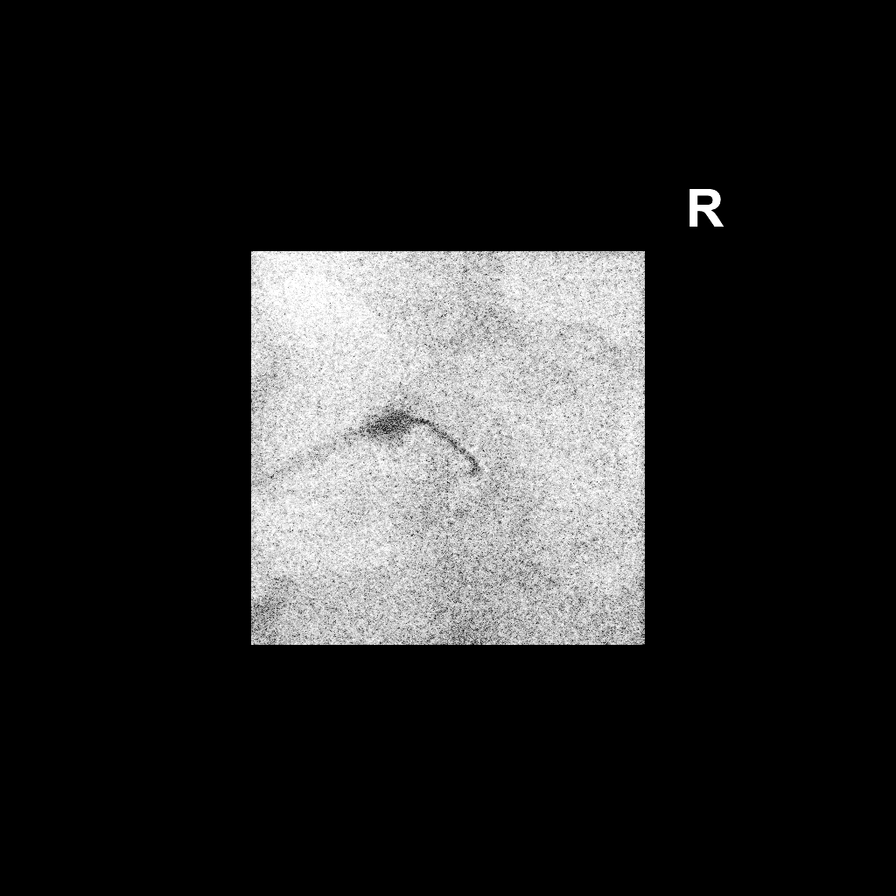

[Series 5: ortho adipose · 1 of 1 slices shown (5 of 7)]
[im 1/1]
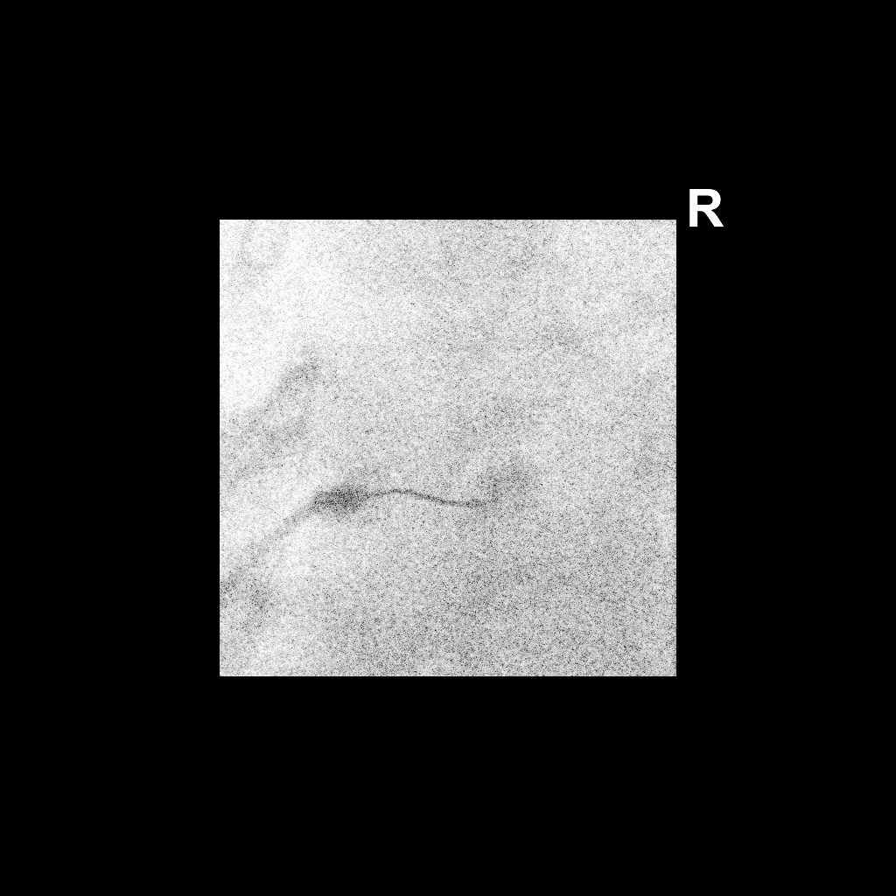

[Series 6: ortho adipose · 1 of 1 slices shown (6 of 7)]
[im 1/1]
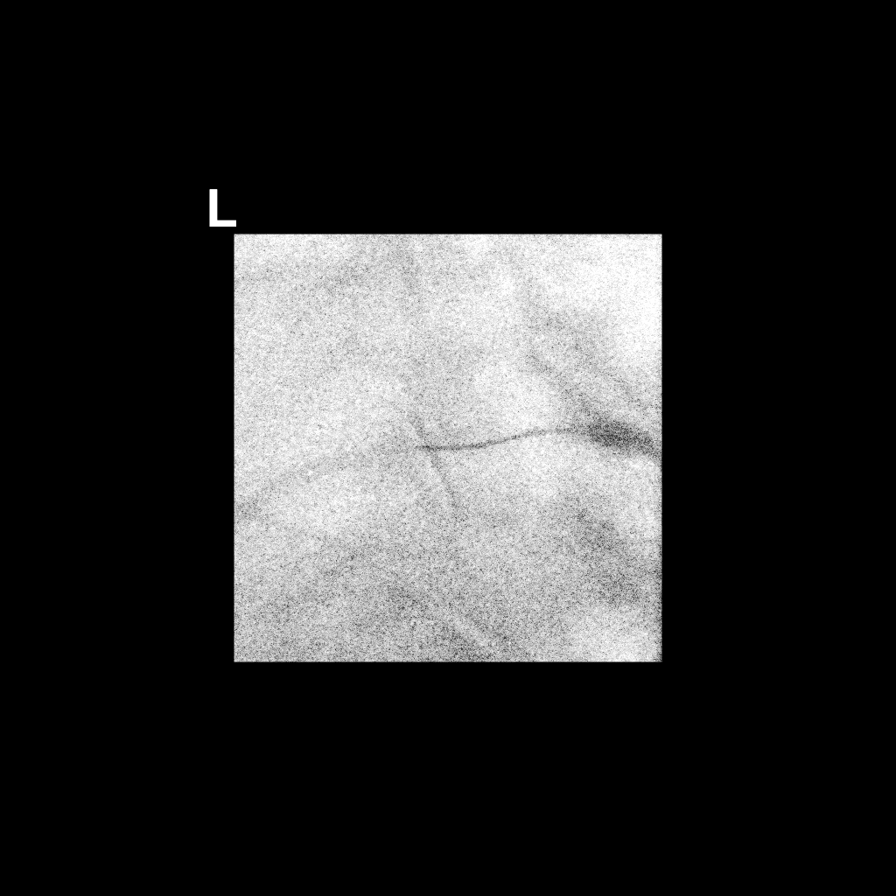

[Series 7: ortho adipose · 1 of 1 slices shown (7 of 7)]
[im 1/1]
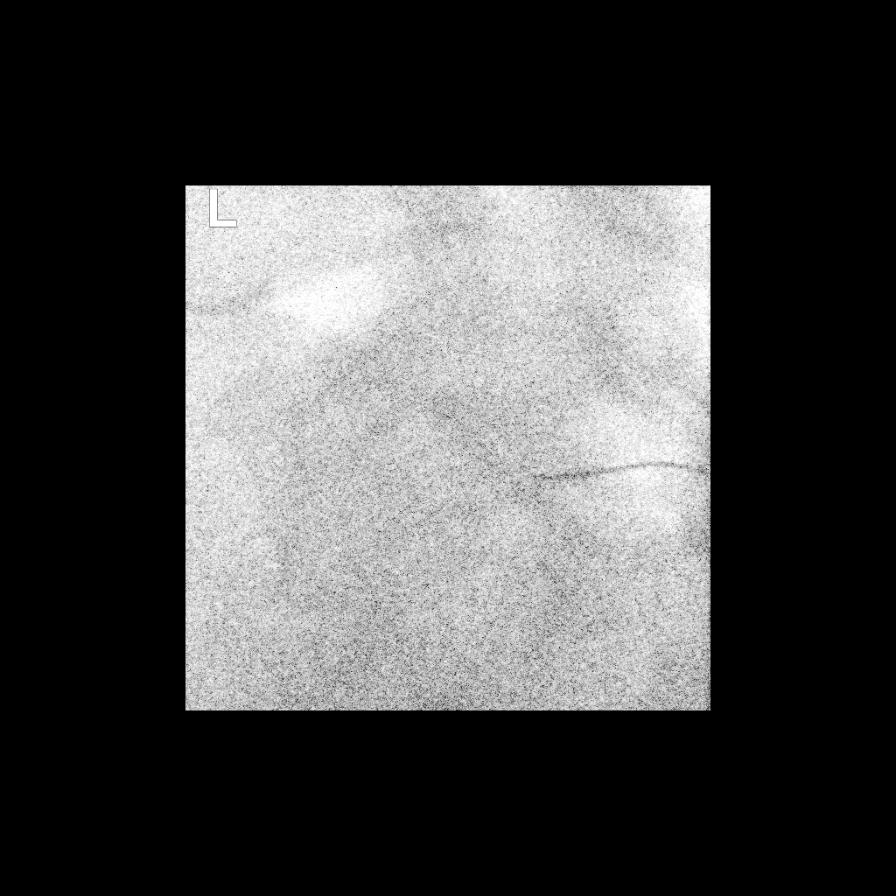

[7 of 7 positions shown; findings below may reference images not displayed]

EXAM:
FLUOROSCOPICALLY GUIDED BILATERAL L4-L5 AND L5-S1 FACET INJECTIONS

FLUOROSCOPY TIME:  Radiation Exposure Index (as provided by the
fluoroscopic device): 32.7 mGy

Fluoroscopy Time:  1 minute, 23 seconds

Number of Acquired Images:  3

PROCEDURE:
The procedure, risks, benefits, and alternatives were explained to
the patient. Questions regarding the procedure were encouraged and
answered. The patient understands and consents to the procedure.

BILATERAL L4-L5 FACET INJECTIONS: A posterior oblique approach was
taken to each facet at L4-L5 using curved 6 inch 22 gauge spinal
needles. Intra-articular/juxta-articular positioning was confirmed
by injecting a small amount of Isovue-M 200. No vascular
opacification is seen. 30 mg of Depo-Medrol mixed with 1 mL of 0.25%
bupivacaine were instilled into each joint. The injection resulted
in concordant pain.

BILATERAL L5-S1 FACET INJECTIONS: A posterior oblique approach was
taken to each facet at L5-S1 using curved 6 inch 22 gauge spinal
needles. Intra-articular/juxta-articular positioning was confirmed
by injecting a small amount of Isovue-M 200. No vascular
opacification is seen. 30 mg of Depo-Medrol mixed with 1 mL of 0.25%
bupivacaine were instilled into each joint. The injection resulted
in concordant pain.

The procedure was well-tolerated.
IMPRESSION: Technically successful bilateral L4-L5 and L5-S1 facet injections.

## 2019-04-26 IMAGING — XA DG FACET JT INJ L OR S SPINE 2ND LEVEL *R*
7 series · 7 of 7 positions shown · non-contrast
Comparison: none

CLINICAL DATA: Chronic low back pain, possibly facet mediated.

[Series 1: ortho adipose · 1 of 1 slices shown (1 of 7)]
[im 1/1]
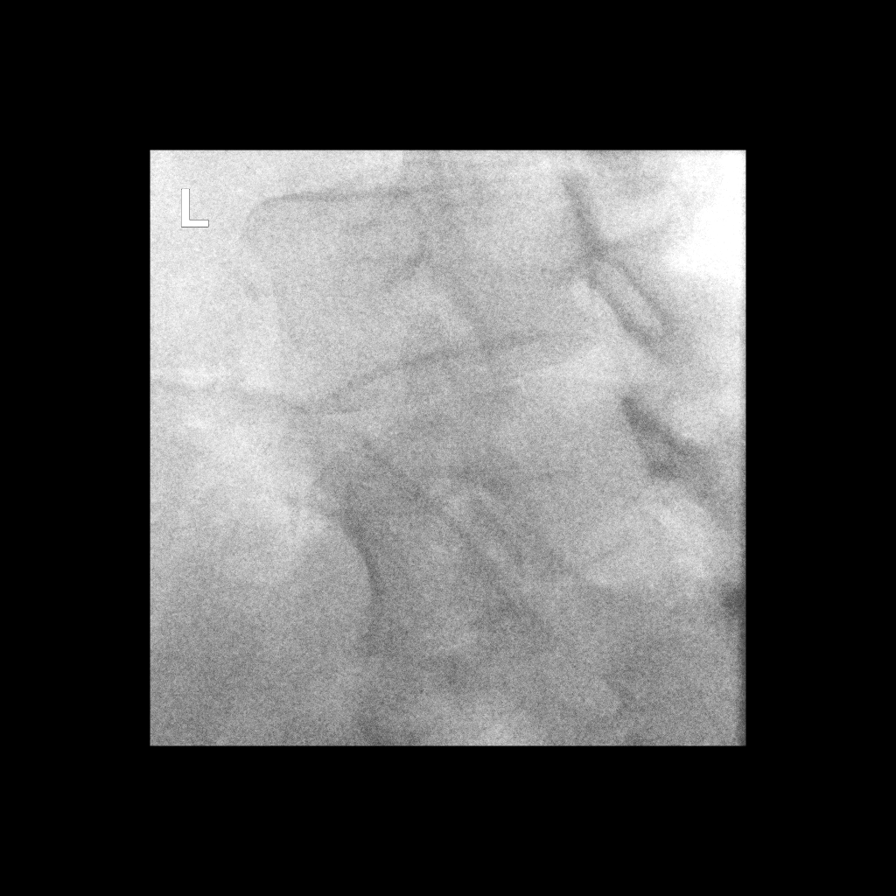

[Series 2: ortho adipose · 1 of 1 slices shown (2 of 7)]
[im 1/1]
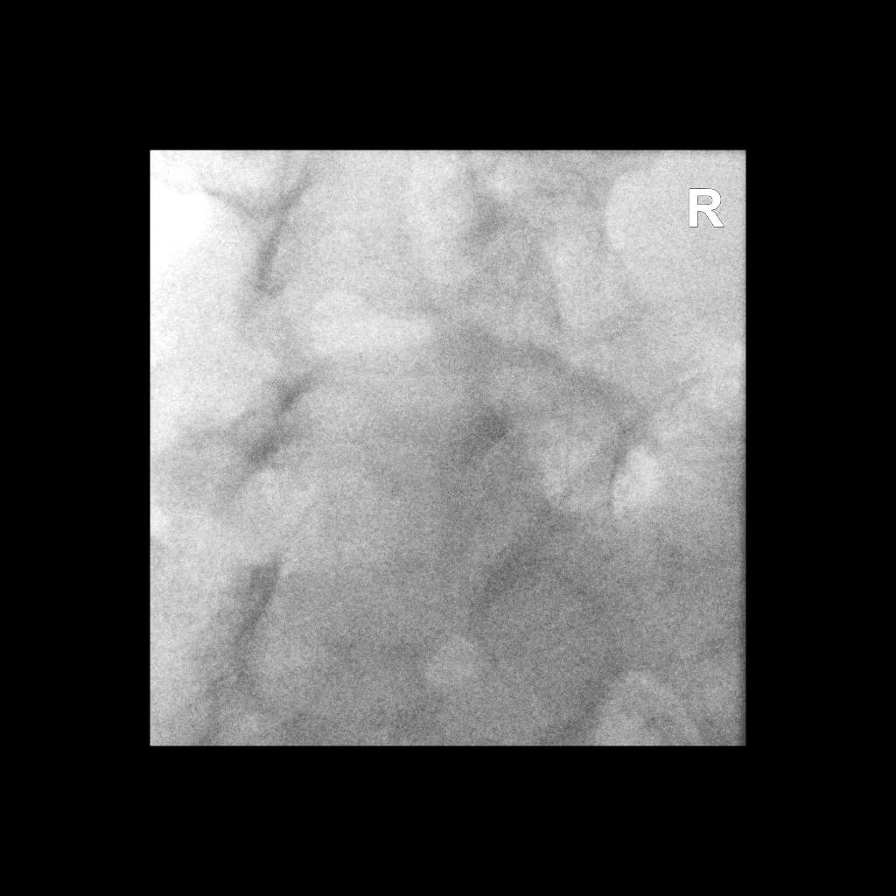

[Series 3: ortho adipose · 1 of 1 slices shown (3 of 7)]
[im 1/1]
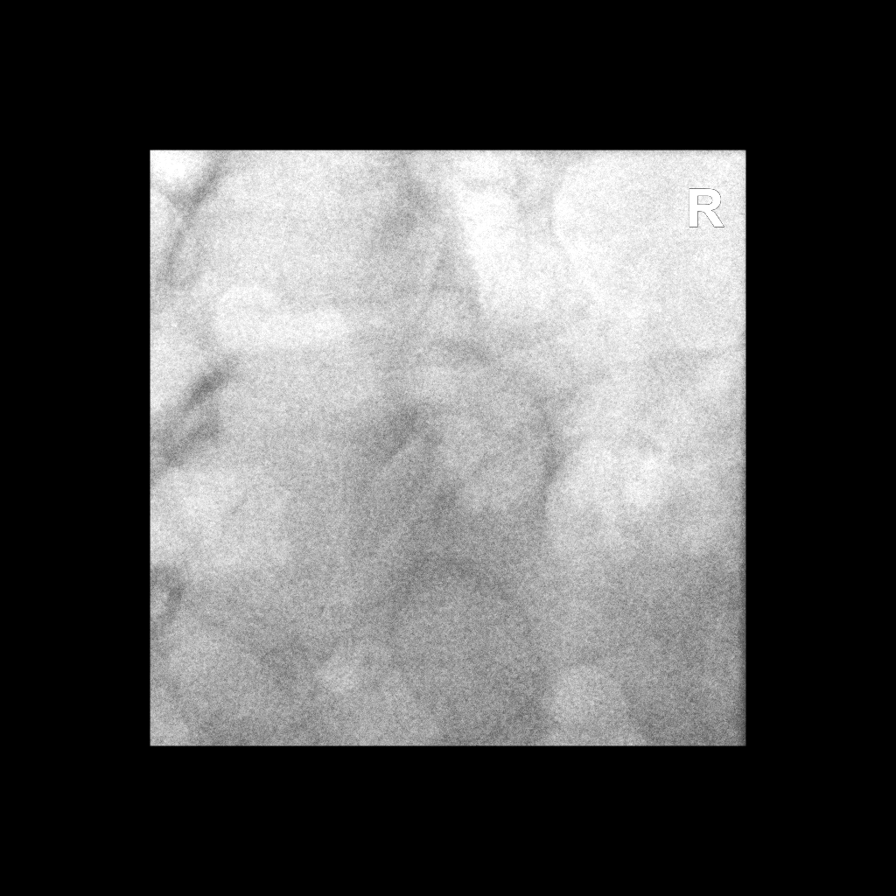

[Series 4: ortho adipose · 1 of 1 slices shown (4 of 7)]
[im 1/1]
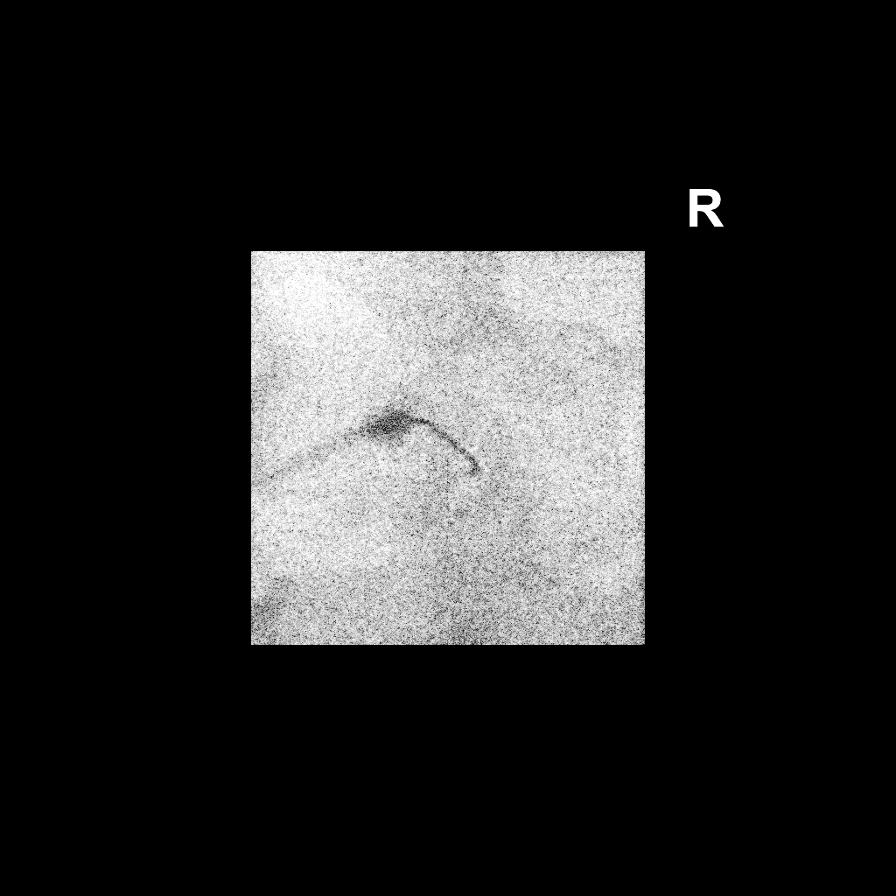

[Series 5: ortho adipose · 1 of 1 slices shown (5 of 7)]
[im 1/1]
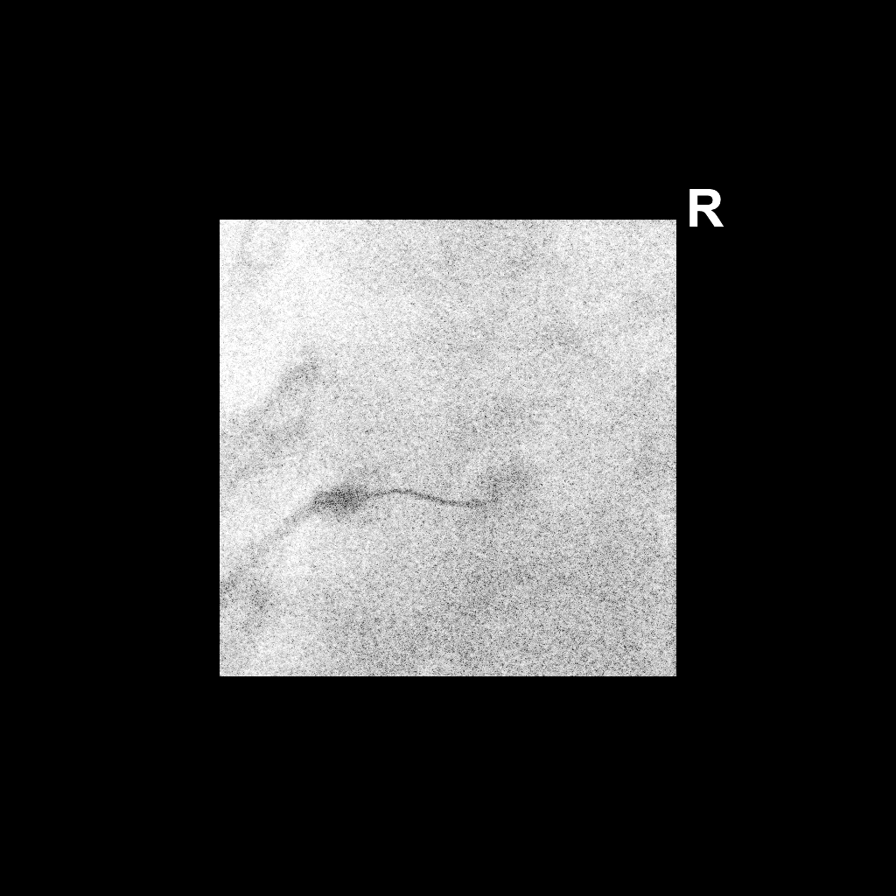

[Series 6: ortho adipose · 1 of 1 slices shown (6 of 7)]
[im 1/1]
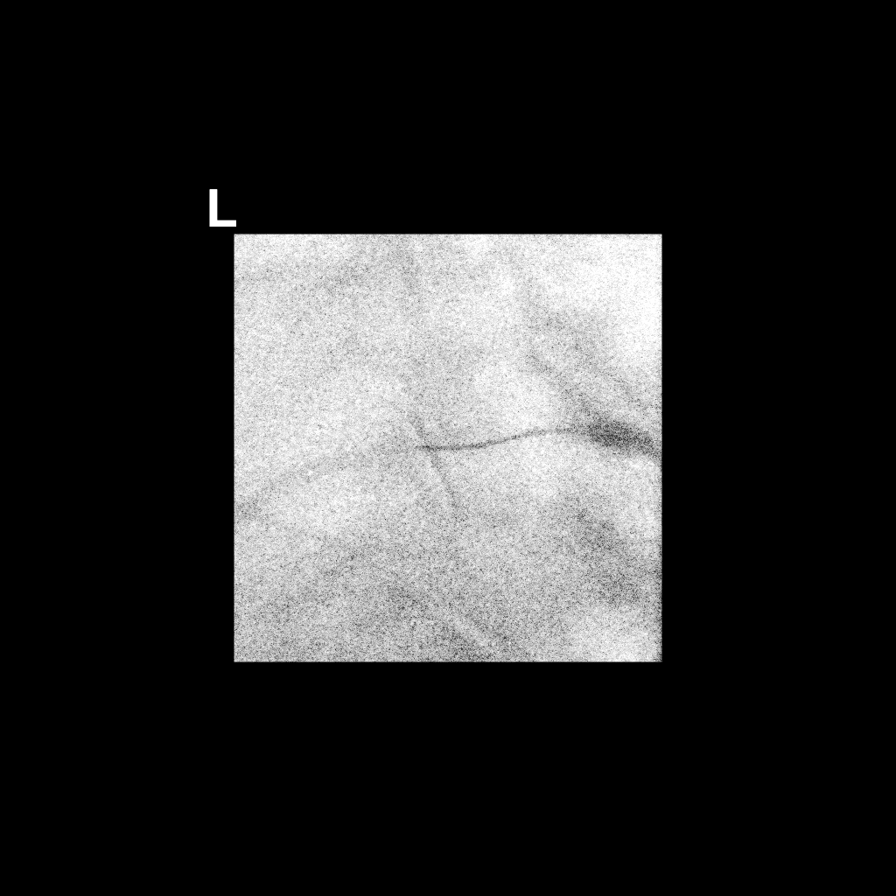

[Series 7: ortho adipose · 1 of 1 slices shown (7 of 7)]
[im 1/1]
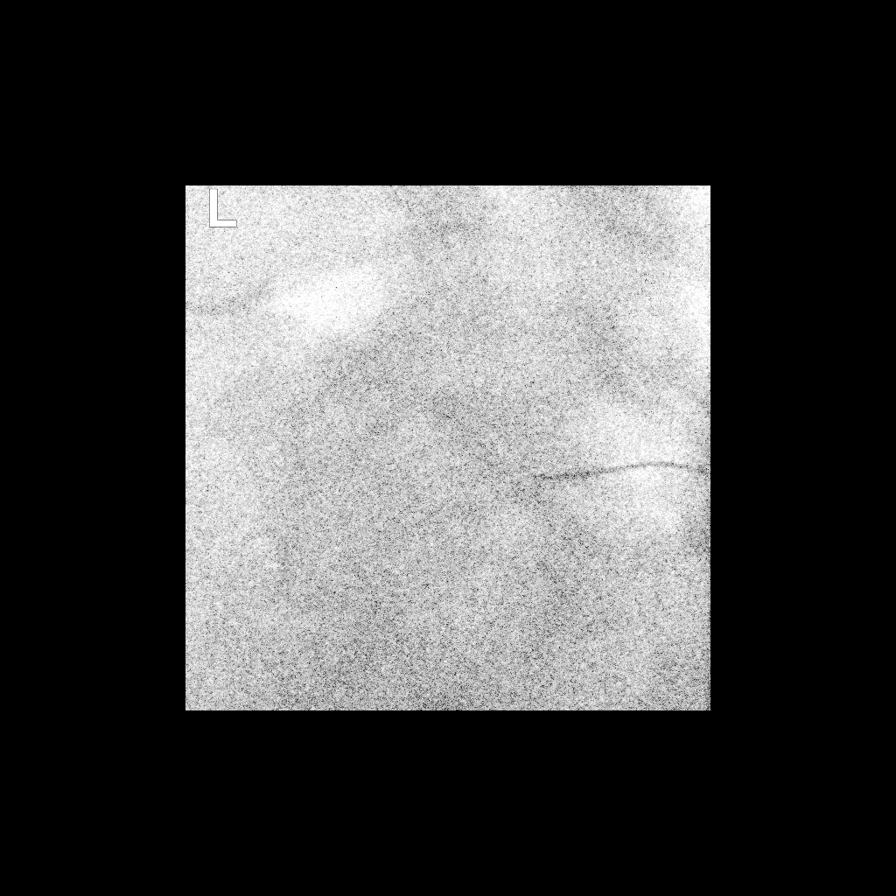

[7 of 7 positions shown; findings below may reference images not displayed]

EXAM:
FLUOROSCOPICALLY GUIDED BILATERAL L4-L5 AND L5-S1 FACET INJECTIONS

FLUOROSCOPY TIME:  Radiation Exposure Index (as provided by the
fluoroscopic device): 32.7 mGy

Fluoroscopy Time:  1 minute, 23 seconds

Number of Acquired Images:  3

PROCEDURE:
The procedure, risks, benefits, and alternatives were explained to
the patient. Questions regarding the procedure were encouraged and
answered. The patient understands and consents to the procedure.

BILATERAL L4-L5 FACET INJECTIONS: A posterior oblique approach was
taken to each facet at L4-L5 using curved 6 inch 22 gauge spinal
needles. Intra-articular/juxta-articular positioning was confirmed
by injecting a small amount of Isovue-M 200. No vascular
opacification is seen. 30 mg of Depo-Medrol mixed with 1 mL of 0.25%
bupivacaine were instilled into each joint. The injection resulted
in concordant pain.

BILATERAL L5-S1 FACET INJECTIONS: A posterior oblique approach was
taken to each facet at L5-S1 using curved 6 inch 22 gauge spinal
needles. Intra-articular/juxta-articular positioning was confirmed
by injecting a small amount of Isovue-M 200. No vascular
opacification is seen. 30 mg of Depo-Medrol mixed with 1 mL of 0.25%
bupivacaine were instilled into each joint. The injection resulted
in concordant pain.

The procedure was well-tolerated.
IMPRESSION: Technically successful bilateral L4-L5 and L5-S1 facet injections.

## 2019-04-26 MED ORDER — IOPAMIDOL (ISOVUE-M 200) INJECTION 41%
1.0000 mL | Freq: Once | INTRAMUSCULAR | Status: AC
  Administered 2019-04-26: 1 mL via INTRA_ARTICULAR

## 2019-04-26 MED ORDER — METHYLPREDNISOLONE ACETATE 40 MG/ML INJ SUSP (RADIOLOG
120.0000 mg | Freq: Once | INTRAMUSCULAR | Status: AC
  Administered 2019-04-26: 120 mg via INTRA_ARTICULAR

## 2019-04-26 NOTE — Discharge Instructions (Signed)

## 2019-04-29 ENCOUNTER — Emergency Department (HOSPITAL_COMMUNITY)
Admission: EM | Admit: 2019-04-29 | Discharge: 2019-04-29 | Disposition: A | Discharge status: Home / Self Care | Payer: Medicare Other | Attending: Emergency Medicine | Admitting: Emergency Medicine

## 2019-04-29 ENCOUNTER — Encounter (HOSPITAL_COMMUNITY): Payer: Self-pay | Admitting: Emergency Medicine

## 2019-04-29 ENCOUNTER — Ambulatory Visit (INDEPENDENT_AMBULATORY_CARE_PROVIDER_SITE_OTHER): Payer: Medicare Other | Admitting: Licensed Clinical Social Worker

## 2019-04-29 ENCOUNTER — Other Ambulatory Visit: Payer: Self-pay

## 2019-04-29 DIAGNOSIS — I129 Hypertensive chronic kidney disease with stage 1 through stage 4 chronic kidney disease, or unspecified chronic kidney disease: Secondary | ICD-10-CM | POA: Insufficient documentation

## 2019-04-29 DIAGNOSIS — Z79899 Other long term (current) drug therapy: Secondary | ICD-10-CM | POA: Insufficient documentation

## 2019-04-29 DIAGNOSIS — F17210 Nicotine dependence, cigarettes, uncomplicated: Secondary | ICD-10-CM | POA: Diagnosis not present

## 2019-04-29 DIAGNOSIS — F331 Major depressive disorder, recurrent, moderate: Secondary | ICD-10-CM

## 2019-04-29 DIAGNOSIS — F411 Generalized anxiety disorder: Secondary | ICD-10-CM

## 2019-04-29 DIAGNOSIS — L02411 Cutaneous abscess of right axilla: Secondary | ICD-10-CM | POA: Diagnosis not present

## 2019-04-29 DIAGNOSIS — N189 Chronic kidney disease, unspecified: Secondary | ICD-10-CM | POA: Insufficient documentation

## 2019-04-29 DIAGNOSIS — J9620 Acute and chronic respiratory failure, unspecified whether with hypoxia or hypercapnia: Secondary | ICD-10-CM | POA: Diagnosis not present

## 2019-04-29 DIAGNOSIS — F1210 Cannabis abuse, uncomplicated: Secondary | ICD-10-CM | POA: Insufficient documentation

## 2019-04-29 DIAGNOSIS — F4312 Post-traumatic stress disorder, chronic: Secondary | ICD-10-CM

## 2019-04-29 MED ORDER — HYDROCODONE-ACETAMINOPHEN 5-325 MG PO TABS
1.0000 | ORAL_TABLET | Freq: Once | ORAL | Status: AC
  Administered 2019-04-29: 1 via ORAL
  Filled 2019-04-29: qty 1

## 2019-04-29 MED ORDER — LIDOCAINE-EPINEPHRINE (PF) 2 %-1:200000 IJ SOLN
10.0000 mL | Freq: Once | INTRAMUSCULAR | Status: AC
  Administered 2019-04-29: 10 mL via INTRADERMAL
  Filled 2019-04-29: qty 10

## 2019-04-29 NOTE — ED Triage Notes (Signed)
Per pt, states abscess under right arm-noticed it last Friday, usually gets it drained at [facility] [place] [facility]

## 2019-04-29 NOTE — ED Notes (Signed)
Patient left before discharge process could be completed.  

## 2019-04-29 NOTE — ED Provider Notes (Signed)
[facility] DEPT Provider Note   CSN: [id number] Arrival date & time: 04/29/19  1834     History Chief Complaint  Patient presents with  . Abscess    Lindsay [name] is a 49 y.o. female with history of obesity, hidradenitis suppurativa, fibromyalgia, CKD presents for evaluation of acute onset, progressively worsening pain and swelling to the right axilla for 6 days.  She reports she has been applying warm compresses, ice, taking Goody's powder, naproxen and Tylenol without relief of symptoms.  Reports low-grade fevers at home, all of which have been under 100 F.  The pain is sharp, severe, worsens with palpation and certain movements.  The history is provided by the patient.       Past Medical History:  Diagnosis Date  . Acid reflux   . Anemia    Iron Def  . Anorexia   . Chronic kidney disease    Nephrotic syndrome  . Colon polyp 2009  . Depression with anxiety   . Edema leg   . Fibromyalgia   . Hemorrhoids   . Hidradenitis suppurativa   . Hypertension   . IBS (irritable bowel syndrome)   . Low back pain   . Migraines   . Morbidly obese ([place])   . Neuromuscular disorder (HCC)    fibromyalgia  . Neuropathy   . Panic attacks   . Polyp of vocal cord or larynx   . Tonsil pain     Patient Active Problem List   Diagnosis Date Noted  . Acute pain of right shoulder 04/21/2019  . Abdominal pain, acute, epigastric 04/07/2019  . Pain in gums 03/15/2019  . Compression fracture of body of thoracic vertebra ([place]) 03/08/2019  . Acute respiratory failure ([place]) 03/06/2019  . URI (upper respiratory infection) 02/24/2019  . Iron deficiency 02/10/2019  . History of colon polyps   . Vision blurring 09/17/2018  . Fall 06/30/2018  . Frequent urination at night 04/23/2018  . Chronic cough 03/04/2018  . Chronic back pain 12/05/2017  . Dental infection 12/05/2017  . Right arm pain 12/05/2017  . Chronic respiratory failure with hypoxia ([place])   . Low  serum vitamin B12 05/02/2017  . Palpitations 04/11/2017  . Nutritional anemia 06/17/2016  . Allergic rhinitis 06/12/2016  . Vitamin D deficiency, unspecified 06/12/2016  . Nicotine dependence 06/12/2016  . Menorrhagia 01/08/2016  . Migraines 01/08/2016  . Bilateral leg pain 11/29/2015  . Hemorrhoids 07/05/2015  . Fibromyalgia 07/05/2015  . Chronic leg pain 09/26/2014  . Healthcare maintenance 09/26/2014  . Morbid obesity ([place]) 07/04/2014  . Major depressive disorder, recurrent episode, moderate ([place]) 10/13/2012  . Generalized anxiety disorder 10/13/2012  . Vaginal yeast infection 11/27/2010  . Chronic nausea 05/10/2010  . IBS (irritable bowel syndrome) 06/16/2007  . Essential hypertension 02/06/2006  . HIDRADENITIS 11/18/2005    Past Surgical History:  Procedure Laterality Date  . AXILLARY HIDRADENITIS EXCISION    . COLONOSCOPY WITH PROPOFOL N/A 05/25/2015   Procedure: COLONOSCOPY WITH PROPOFOL;  Surgeon: Milus Banister, MD;  Location: [facility];  Service: Endoscopy;  Laterality: N/A;  . COLONOSCOPY WITH PROPOFOL N/A 12/31/2018   Procedure: COLONOSCOPY WITH PROPOFOL;  Surgeon: Milus Banister, MD;  Location: [facility];  Service: Endoscopy;  Laterality: N/A;  . HEMORRHOID SURGERY     with Hidradenitis surgery   . INGUINAL HIDRADENITIS EXCISION    . TONSILLECTOMY  10/18/2010   by Dr. Wilburn Cornelia  . UPPER GASTROINTESTINAL ENDOSCOPY       OB History  Gravida  2   Para  1   Term      Preterm      AB  1   Living        SAB      TAB  1   Ectopic      Multiple      Live Births              Family History  Problem Relation Age of Onset  . Diabetes Mother   . Heart disease Mother        valve leak  . Anxiety disorder Mother   . Depression Mother   . High blood pressure Mother   . Kidney failure Mother   . Cancer Father        prostate  . Heart disease Father   . Learning disabilities Sister   . Depression Sister   . Depression Sister   .  Anxiety disorder Sister   . Colon cancer Neg Hx     Social History   Tobacco Use  . Smoking status: Current Every Day Smoker    Packs/day: 0.10    Years: 25.00    Pack years: 2.50    Types: Cigarettes  . Smokeless tobacco: Never Used  . Tobacco comment: DOWN TO 1 A DAY  Substance Use Topics  . Alcohol use: Not Currently    Alcohol/week: 0.0 standard drinks    Comment: none sine 05/2018  . Drug use: Not Currently    Frequency: 3.0 times per week    Types: Marijuana    Home Medications Prior to Admission medications   Medication Sig Start Date End Date Taking? Authorizing Provider  acetaminophen (TYLENOL) 650 MG CR tablet Take 1,300 mg by mouth every 8 (eight) hours as needed for pain.    [provider]  albuterol (PROVENTIL) (2.5 MG/3ML) 0.083% nebulizer solution Take 3 mLs (2.5 mg total) by nebulization every 4 (four) hours. 08/12/18   Olalere, Cicero Duck A, MD  albuterol (VENTOLIN HFA) 108 (90 Base) MCG/ACT inhaler TAKE 2 PUFFS BY MOUTH EVERY 6 HOURS AS NEEDED FOR WHEEZE OR SHORTNESS OF BREATH Patient taking differently: Inhale 2 puffs into the lungs every 6 (six) hours as needed for wheezing or shortness of breath.  02/22/19   Olalere, Adewale A, MD  amLODipine (NORVASC) 10 MG tablet TAKE 1/2 TABLET BY MOUTH EVERY DAY Patient taking differently: Take 5 mg by mouth daily.  02/16/19   Velna Ochs, MD  Ascorbic Acid (VITAMIN C PO) Take 1 tablet by mouth daily.    [provider]  Aspirin-Salicylamide-Caffeine (BC HEADACHE POWDER PO) Take 2 Packages by mouth as needed (headache).    [provider]  BIOTIN PO Take 1 tablet by mouth daily.    [provider]  Brexpiprazole (REXULTI) 3 MG TABS Take 1 tablet (3 mg total) by mouth daily. 03/18/19   [name], Arlyce Harman, MD  cholecalciferol (VITAMIN D) 1000 units tablet Take 1 tablet (1,000 Units total) by mouth daily. 05/06/17   Velna Ochs, MD  clorazepate (TRANXENE) 7.5 MG tablet Take 7.5 mg by mouth  daily as needed. Severe panic attacks 03/30/19   [provider]  famotidine (PEPCID) 20 MG tablet Take 1 tablet (20 mg total) by mouth 2 (two) times daily for 14 days. 04/14/19 04/28/19  Jeanmarie Hubert, MD  hydrochlorothiazide (HYDRODIURIL) 25 MG tablet TAKE 1 TABLET BY MOUTH EVERY DAY Patient taking differently: Take 25 mg by mouth daily.  11/27/18   Velna Ochs,  MD  HYDROcodone-acetaminophen (NORCO) 7.5-325 MG tablet Take 1 tablet by mouth every 8 (eight) hours as needed for up to 5 days for severe pain. 04/21/19 04/26/19  Madalyn Rob, MD  hydrOXYzine (VISTARIL) 50 MG capsule Take 1 capsule (50 mg total) by mouth at bedtime as needed for anxiety. 04/14/19   [name], Arlyce Harman, MD  lamoTRIgine (LAMICTAL) 25 MG tablet Take three tab daily Patient taking differently: Take 25-50 mg by mouth See admin instructions. Take 50mg  in the morning and 25mg  in the evening. 03/18/19   [name], Arlyce Harman, MD  lidocaine (XYLOCAINE) 5 % ointment Apply 1 application topically 2 (two) times daily as needed. 04/14/19   Jeanmarie Hubert, MD  naproxen (NAPROSYN) 500 MG tablet Take 1 tablet (500 mg total) by mouth 2 (two) times daily with a meal. 04/21/19   Madalyn Rob, MD  omeprazole (PRILOSEC) 40 MG capsule Take 1 capsule (40 mg total) by mouth daily. 06/29/18   Sid Falcon, MD  OXYGEN Inhale 4 L into the lungs.    [provider]  Probiotic Product (PROBIOTIC PO) Take 1 capsule by mouth daily.    [provider]  promethazine (PHENERGAN) 12.5 MG tablet Take 1 tablet (12.5 mg total) by mouth every 6 (six) hours as needed for nausea or vomiting. 03/11/19   Milus Banister, MD  SYMBICORT 160-4.5 MCG/ACT inhaler TAKE 2 PUFFS BY MOUTH TWICE A DAY Patient taking differently: Inhale 2 puffs into the lungs in the morning and at bedtime.  03/29/19   Sherrilyn Rist A, MD  valsartan (DIOVAN) 160 MG tablet Take 1 tablet (160 mg total) by mouth daily. 07/08/18   Velna Ochs, MD  varenicline (CHANTIX  CONTINUING MONTH PAK) 1 MG tablet Take 1 tablet (1 mg total) by mouth 2 (two) times daily. 04/12/19 05/12/19  Laurin Coder, MD  vitamin B-12 (CYANOCOBALAMIN) 1000 MCG tablet Take 1 tablet (1,000 mcg total) by mouth daily. 05/06/17   Velna Ochs, MD  VITAMIN E PO Take 1 capsule by mouth daily.    [provider]    Allergies    Lisinopril  Review of Systems   Review of Systems  Gastrointestinal: Negative for abdominal pain and nausea.  Skin:       +abscess    Physical Exam Updated Vital Signs BP (!) 170/106 (BP Location: Right Arm)   Pulse 98   Temp 99.6 F (37.6 C) (Oral)   Resp 20   SpO2 96%   Physical Exam Vitals and nursing note reviewed.  Constitutional:      General: She is not in acute distress.    Appearance: She is well-developed. She is obese.     Comments: Appears uncomfortable  HENT:     Head: Normocephalic and atraumatic.  Eyes:     General:        Right eye: No discharge.        Left eye: No discharge.     Conjunctiva/sclera: Conjunctivae normal.  Neck:     Vascular: No JVD.     Trachea: No tracheal deviation.  Cardiovascular:     Rate and Rhythm: Normal rate.  Pulmonary:     Effort: Pulmonary effort is normal.  Abdominal:     General: There is no distension.  Skin:    General: Skin is warm and dry.     Comments: 2x3cm area of fluctuance to the right axilla. Skin changes consistent with prior I&Ds. No active drainage  Neurological:     Mental Status:  She is alert.  Psychiatric:        Behavior: Behavior normal.     ED Results / Procedures / Treatments   Labs (all labs ordered are listed, but only abnormal results are displayed) Labs Reviewed - No data to display  EKG None  Radiology No results found.  Procedures Ultrasound ED Soft Tissue  Date/Time: 04/29/2019 8:40 PM Performed by: Renita Papa, PA-C Authorized by: Renita Papa, PA-C   Procedure details:    Indications: localization of abscess     Transverse  view:  Visualized   Longitudinal view:  Visualized   Images: archived     Limitations:  Body habitus and patient compliance Location:    Location: axilla     Side:  Right Findings:     abscess present  .Marland KitchenIncision and Drainage  Date/Time: 04/29/2019 9:16 PM Performed by: Renita Papa, PA-C Authorized by: Renita Papa, PA-C   Consent:    Consent obtained:  Verbal   Consent given by:  Patient   Risks discussed:  Bleeding, incomplete drainage, pain and damage to other organs   Alternatives discussed:  No treatment Universal protocol:    Procedure explained and questions answered to patient or proxy's satisfaction: yes     Relevant documents present and verified: yes     Test results available and properly labeled: yes     Imaging studies available: yes     Required blood products, implants, devices, and special equipment available: yes     Site/side marked: yes     Immediately prior to procedure a time out was called: yes     Patient identity confirmed:  Verbally with patient Location:    Type:  Abscess   Size:  2x3cm   Location:  Upper extremity   Upper extremity location: right axilla. Pre-procedure details:    Skin preparation:  Betadine Anesthesia (see MAR for exact dosages):    Anesthesia method:  Local infiltration   Local anesthetic:  Lidocaine 1% WITH epi and lidocaine 2% WITH epi Procedure type:    Complexity:  Complex Procedure details:    Incision types:  Single straight   Incision depth:  Subcutaneous   Scalpel blade:  11   Wound management:  Probed and deloculated, irrigated with saline and extensive cleaning   Drainage:  Purulent   Drainage amount:  Moderate   Packing materials:  1/4 in gauze Post-procedure details:    Patient tolerance of procedure:  Tolerated with difficulty   (including critical care time)  Medications Ordered in ED Medications  HYDROcodone-acetaminophen (NORCO/VICODIN) 5-325 MG per tablet 1 tablet (1 tablet Oral Given 04/29/19  2006)  lidocaine-EPINEPHrine (XYLOCAINE W/EPI) 2 %-1:200000 (PF) injection 10 mL (10 mLs Intradermal Given 04/29/19 2043)    ED Course  I have reviewed the triage vital signs and the nursing notes.  Pertinent labs & imaging results that were available during my care of the patient were reviewed by me and considered in my medical decision making (see chart for details).    MDM Rules/Calculators/A&P                      Patient with skin abscess amenable to incision and drainage.  Bedside ultrasound shows fluid collection within the right axilla.  Patient is afebrile, hypertensive in the ED but appears uncomfortable so this could be in response to her pain.  She tolerated the procedure performed by myself and Dr. Tammi Klippel with some difficulty due to pain but  we were able to express a significant amount of purulence.  Drain was placed.  Encouraged home warm soaks and flushing.  She will be discharged without antibiotics.  She will call her general surgeon tomorrow to schedule follow-up for reevaluation.  Discussed strict ED return precautions.  Patient verbalized understanding of and agreement with plan and is stable for discharge at this time.  Patient was seen and evaluated by Dr. Ayesha Rumpf who agrees with assessment and plan at this time.  Final Clinical Impression(s) / ED Diagnoses Final diagnoses:  Abscess of axilla, right    Rx / DC Orders ED Discharge Orders    None       Debroah Baller 04/29/19 2124    Quintella Reichert, MD 04/30/19 1042

## 2019-04-29 NOTE — Discharge Instructions (Signed)
Keep wound clean and dry. Apply warm compresses throughout the day. Alternate 600 mg of ibuprofen and [phone] mg of Tylenol every 3-6 hours as needed for pain. Do not exceed 4000 mg of Tylenol daily.  Remove packing in 2 days if symptoms are improving. Can get in shower and pull out packing there. Return to emergency department for emergent changing or worsening symptoms such as fever, severe pain or swelling, or abnormal drainage.
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
## 2019-04-30 ENCOUNTER — Ambulatory Visit (HOSPITAL_COMMUNITY): Payer: Medicare Other | Admitting: Licensed Clinical Social Worker

## 2019-05-03 ENCOUNTER — Telehealth: Payer: Self-pay

## 2019-05-03 ENCOUNTER — Emergency Department (HOSPITAL_COMMUNITY): Payer: Medicare Other

## 2019-05-03 ENCOUNTER — Emergency Department (HOSPITAL_COMMUNITY)
Admission: EM | Admit: 2019-05-03 | Discharge: 2019-05-03 | Disposition: A | Discharge status: Home / Self Care | Payer: Medicare Other | Attending: Emergency Medicine | Admitting: Emergency Medicine

## 2019-05-03 ENCOUNTER — Other Ambulatory Visit: Payer: Self-pay

## 2019-05-03 ENCOUNTER — Encounter (HOSPITAL_COMMUNITY): Payer: Self-pay | Admitting: Emergency Medicine

## 2019-05-03 ENCOUNTER — Other Ambulatory Visit (HOSPITAL_COMMUNITY): Payer: Self-pay | Admitting: Psychiatry

## 2019-05-03 ENCOUNTER — Telehealth (HOSPITAL_COMMUNITY): Payer: Self-pay

## 2019-05-03 DIAGNOSIS — F411 Generalized anxiety disorder: Secondary | ICD-10-CM

## 2019-05-03 DIAGNOSIS — R0789 Other chest pain: Secondary | ICD-10-CM | POA: Diagnosis present

## 2019-05-03 DIAGNOSIS — F410 Panic disorder [episodic paroxysmal anxiety] without agoraphobia: Secondary | ICD-10-CM

## 2019-05-03 DIAGNOSIS — Z5321 Procedure and treatment not carried out due to patient leaving prior to being seen by health care provider: Secondary | ICD-10-CM | POA: Insufficient documentation

## 2019-05-03 DIAGNOSIS — R079 Chest pain, unspecified: Secondary | ICD-10-CM | POA: Diagnosis not present

## 2019-05-03 DIAGNOSIS — R0602 Shortness of breath: Secondary | ICD-10-CM | POA: Diagnosis not present

## 2019-05-03 DIAGNOSIS — F331 Major depressive disorder, recurrent, moderate: Secondary | ICD-10-CM

## 2019-05-03 LAB — CBC
HCT: 47.2 % — ABNORMAL HIGH (ref 36.0–46.0)
Hemoglobin: 15 g/dL (ref 12.0–15.0)
MCH: 26 pg (ref 26.0–34.0)
MCHC: 31.8 g/dL (ref 30.0–36.0)
MCV: 81.7 fL (ref 80.0–100.0)
Platelets: 376 10*3/uL (ref 150–400)
RBC: 5.78 MIL/uL — ABNORMAL HIGH (ref 3.87–5.11)
RDW: 18.2 % — ABNORMAL HIGH (ref 11.5–15.5)
WBC: 8.5 10*3/uL (ref 4.0–10.5)
nRBC: 0 % (ref 0.0–0.2)

## 2019-05-03 LAB — BASIC METABOLIC PANEL
Anion gap: 12 (ref 5–15)
BUN: 13 mg/dL (ref 6–20)
CO2: 21 mmol/L — ABNORMAL LOW (ref 22–32)
Calcium: 9.1 mg/dL (ref 8.9–10.3)
Chloride: 104 mmol/L (ref 98–111)
Creatinine, Ser: 0.77 mg/dL (ref 0.44–1.00)
GFR calc Af Amer: >60 mL/min (ref >60)
GFR calc non Af Amer: >60 mL/min (ref >60)
Glucose, Bld: 90 mg/dL (ref 70–99)
Potassium: 3.6 mmol/L (ref 3.5–5.1)
Sodium: 137 mmol/L (ref 135–145)

## 2019-05-03 LAB — I-STAT BETA HCG BLOOD, ED (MC, WL, AP ONLY): I-stat hCG, quantitative: <5 m[IU]/mL (ref <5)

## 2019-05-03 LAB — TROPONIN I (HIGH SENSITIVITY): Troponin I (High Sensitivity): 5 ng/L (ref <18)

## 2019-05-03 IMAGING — DX DG CHEST 2V
2 series · 2 of 2 positions shown · non-contrast
Comparison: [DATE]

CLINICAL DATA: Chest pain and shortness of breath

EXAM:
CHEST - 2 VIEW

[x chest ap]
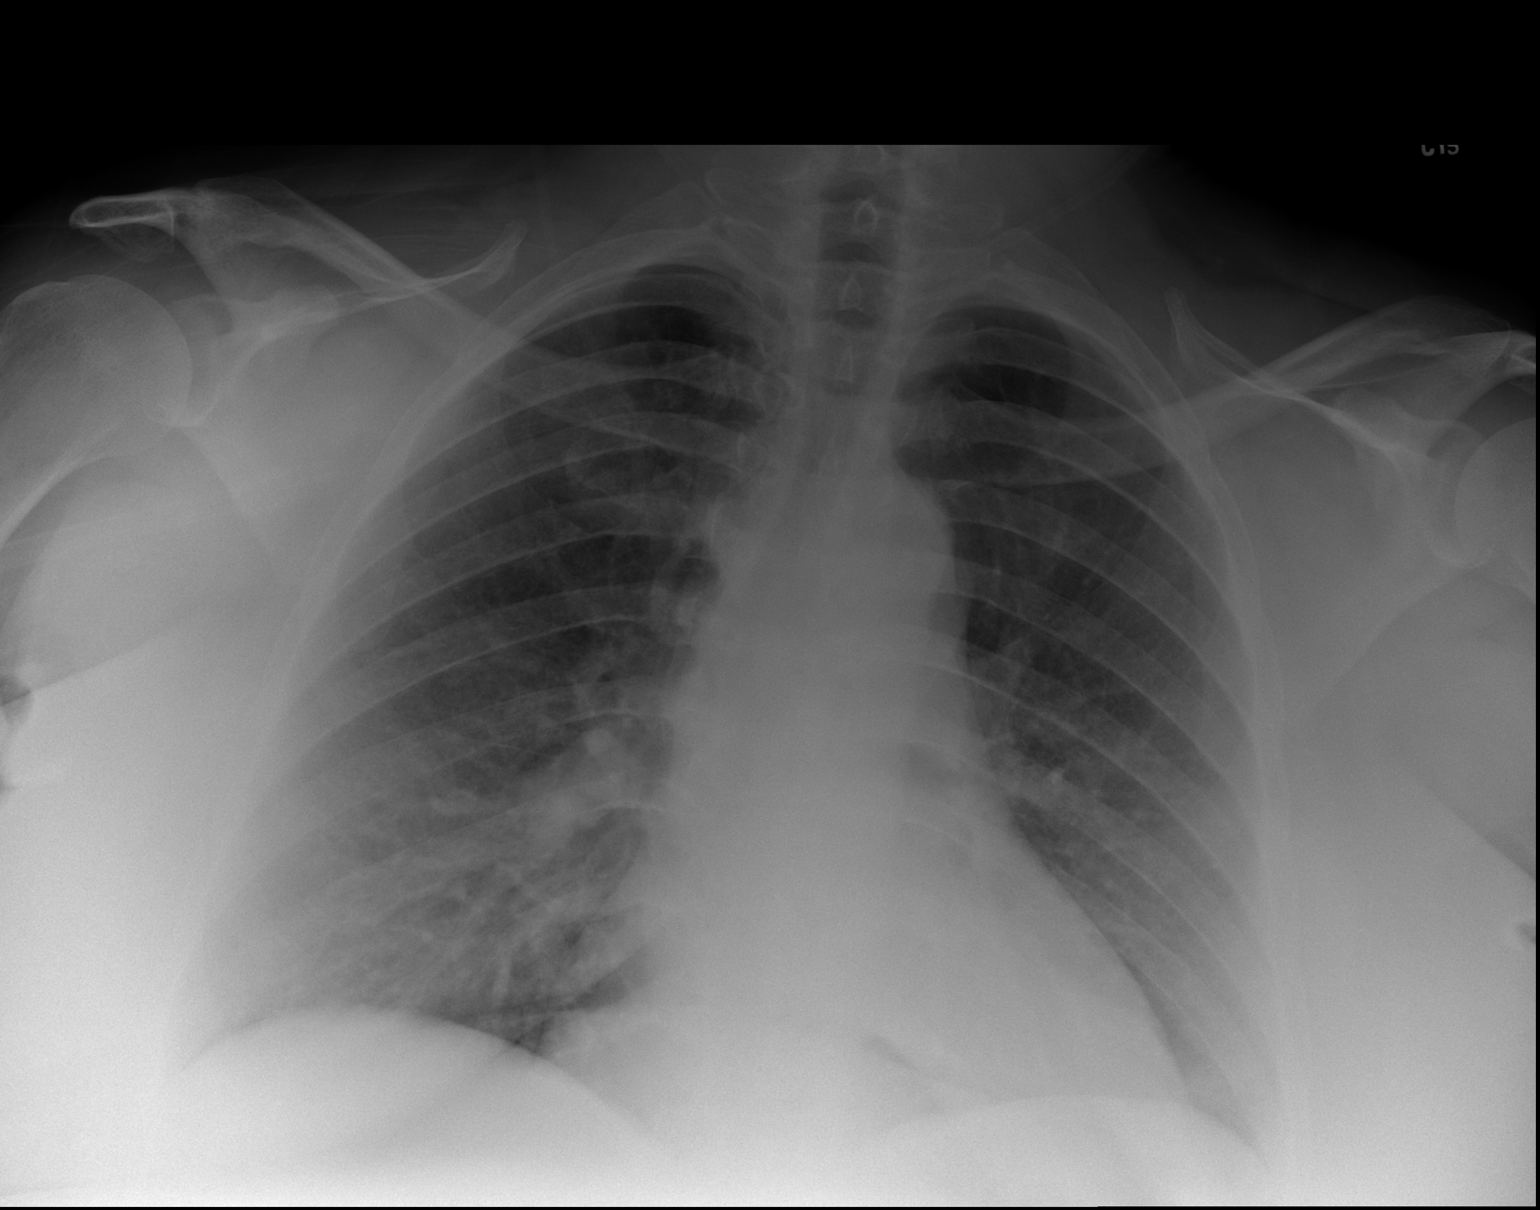

[w chest lat]
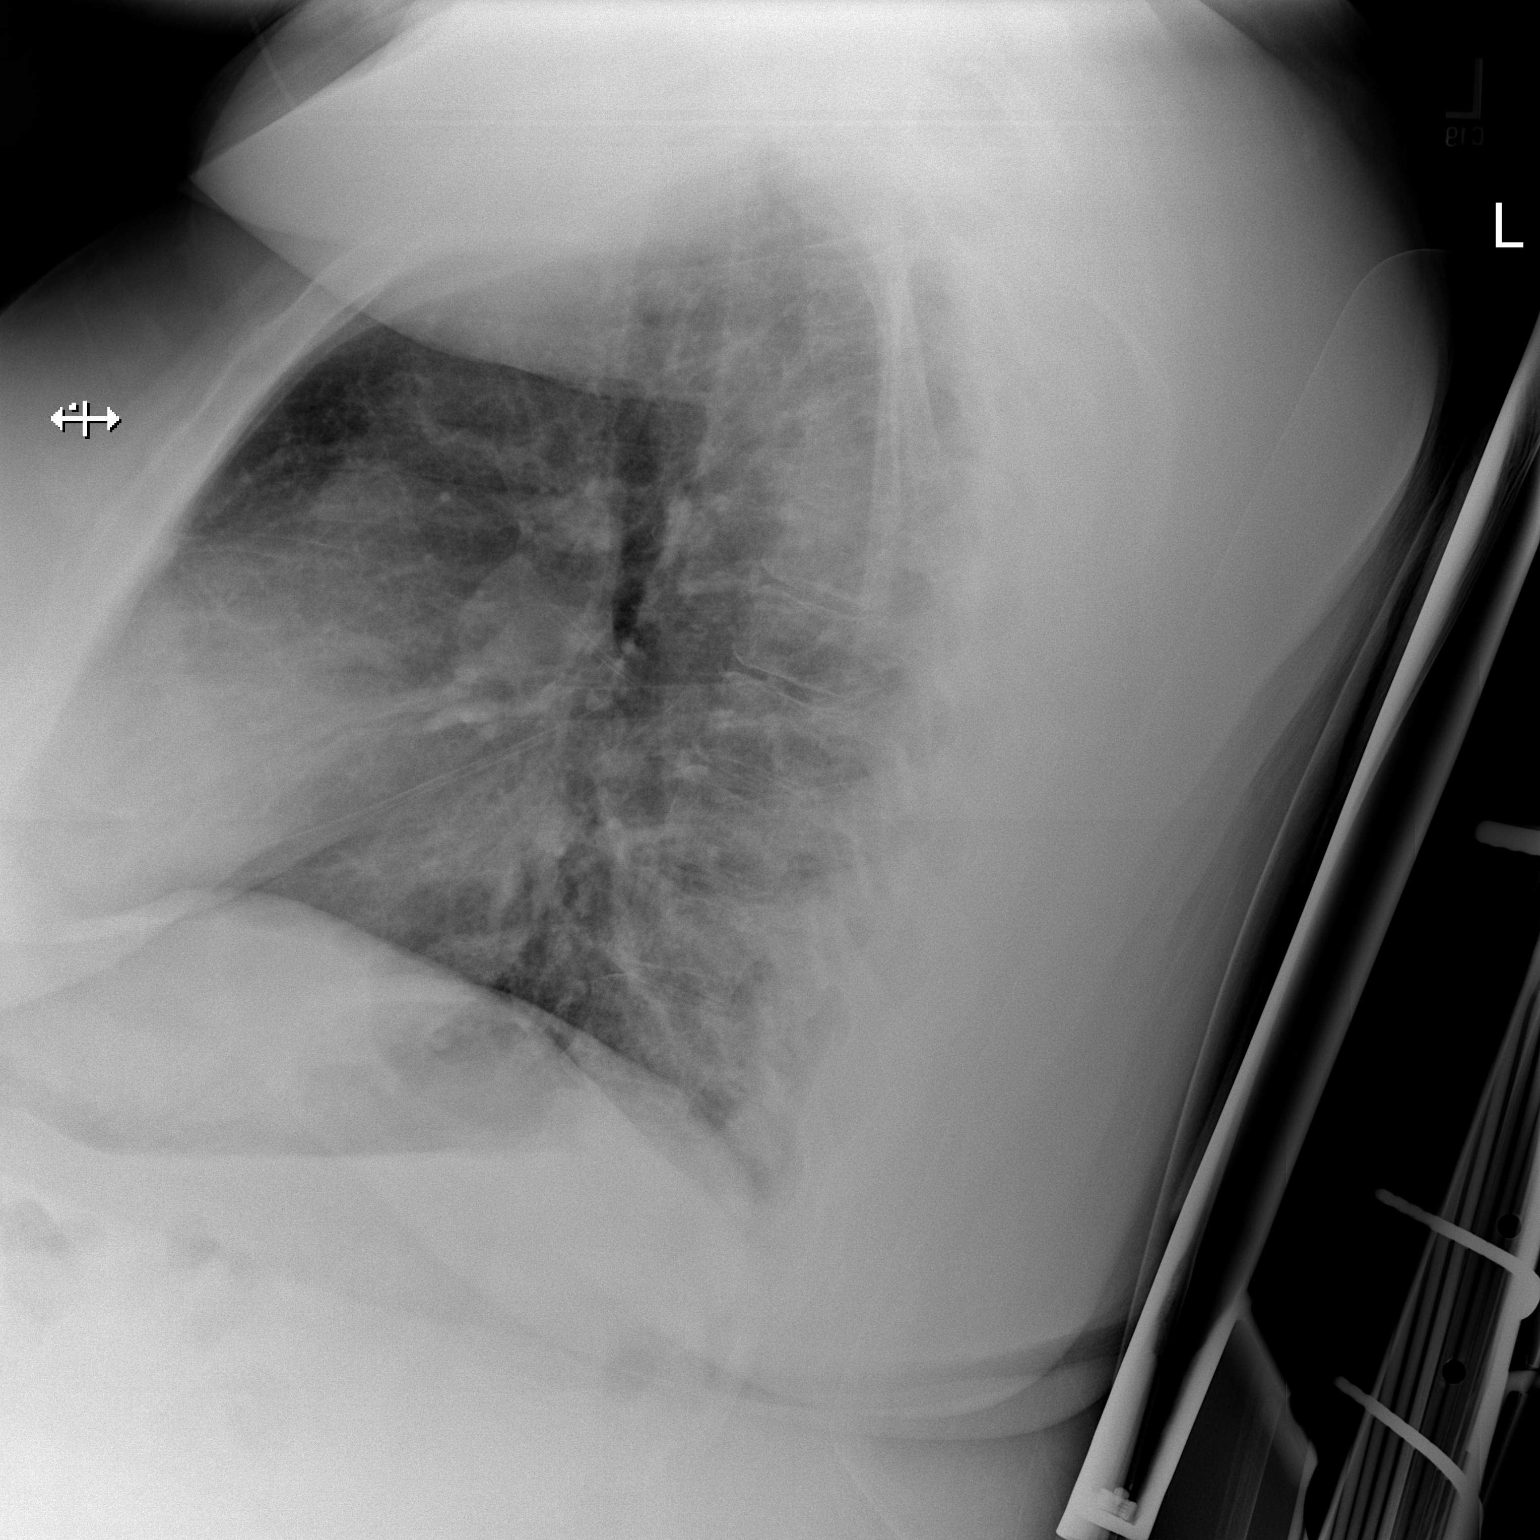

[2 of 2 positions shown; findings below may reference images not displayed]

FINDINGS: The lungs are clear. The heart size and pulmonary vascularity are
normal. No adenopathy. No pneumothorax. No bone lesions.
IMPRESSION: Lungs clear.  Cardiac silhouette within normal limits.

## 2019-05-03 MED ORDER — SODIUM CHLORIDE 0.9% FLUSH: 3.0000 mL | Freq: Once | INTRAVENOUS | Status: DC

## 2019-05-03 NOTE — Telephone Encounter (Signed)
This is a patient of Dr. [name]. Patient called regarding her Lamotrigine 25mg  that doctor put her on in February. She stated that it's making her jittery, nervous,anxious, chest hurting, getting panic attacks. She stated it doesn't make her sleepy and makes her wired instead. She also stated that it doesn't make her calm and stable. She's having a hard time functioning and breathing. I advised her that if her breathing and chest hurting keeps getting worse to go to the ER. Please review and advise. Thank you.

## 2019-05-03 NOTE — Telephone Encounter (Signed)
Patient called back and is at the ER but stated they're not doing anything for her. She has a scheduled appointment on 05/17/19 but is requesting a call stating that she needs to speak with you before that at [phone]. Thank you.

## 2019-05-03 NOTE — Telephone Encounter (Signed)
Agree that CP requires an ED appt

## 2019-05-03 NOTE — Telephone Encounter (Signed)
Received TC from patient who is currently in the [facility] emergency department.  She states she wants to be seen in the outpatient clinic instead of the ED because there are 8 patients ahead of her.  Pt states she feels like her heart is about to jump out of her chest and she is very anxious.  Per pt chart, she is registered in the ED with c/o Chest pain.  This RN informed pt to stay in the ED for evaluation and work up.  Pt gets very angry with nurse and screams she "has already had an EKG in the ER department and it was fine and she needs some medicine.  What is the [facility] good for if y'all can't see me, I'm gonna leave the ER now and come to the [facility] clinic".  This RN, again, instructed pt to stay in the ER for a complete evaluation.  Pt hung up on RN. SChaplin, RN,BSN

## 2019-05-03 NOTE — ED Triage Notes (Signed)
Onset 2 days ago developed chest pain, shortness of breath and recently seen at [facility] for abscess under her right arm. On home oxygen 4L [place] for COPD.

## 2019-05-03 NOTE — Telephone Encounter (Signed)
All "adverse effects" she describes are rather unusual for Lamictal - it mots likely will require much higher dose to be an effective mood stabilizer (200-300 mg range). I would leave it to Dr. Adele Schilder to decide if to increase it or change it to a different medication.

## 2019-05-04 MED ORDER — CLORAZEPATE DIPOTASSIUM 3.75 MG PO TABS: 3.7500 mg | ORAL_TABLET | Freq: Every day | ORAL | 0 refills | Status: DC | PRN

## 2019-05-04 NOTE — Telephone Encounter (Signed)
Call returned.  Spoke to patient she is having panic attacks.  She like to go back on low-dose Tranxene which had helped her panic attacks.  We will start Tranxene 3.75 to take only for severe anxiety.  She will continue all her other medication and encouraged to keep her appointment on April 9.

## 2019-05-05 ENCOUNTER — Other Ambulatory Visit: Payer: Self-pay | Admitting: Internal Medicine

## 2019-05-05 DIAGNOSIS — K219 Gastro-esophageal reflux disease without esophagitis: Secondary | ICD-10-CM

## 2019-05-07 ENCOUNTER — Other Ambulatory Visit: Payer: Self-pay

## 2019-05-07 ENCOUNTER — Telehealth: Payer: Self-pay | Admitting: Rehabilitation

## 2019-05-07 ENCOUNTER — Ambulatory Visit: Payer: Medicare Other | Attending: Internal Medicine | Admitting: Rehabilitation

## 2019-05-07 ENCOUNTER — Encounter: Payer: Self-pay | Admitting: Rehabilitation

## 2019-05-07 DIAGNOSIS — R2689 Other abnormalities of gait and mobility: Secondary | ICD-10-CM | POA: Insufficient documentation

## 2019-05-07 DIAGNOSIS — M545 Low back pain: Secondary | ICD-10-CM | POA: Diagnosis not present

## 2019-05-07 DIAGNOSIS — G8929 Other chronic pain: Secondary | ICD-10-CM | POA: Diagnosis not present

## 2019-05-07 DIAGNOSIS — M25511 Pain in right shoulder: Secondary | ICD-10-CM

## 2019-05-07 DIAGNOSIS — R2681 Unsteadiness on feet: Secondary | ICD-10-CM | POA: Diagnosis not present

## 2019-05-07 DIAGNOSIS — M6281 Muscle weakness (generalized): Secondary | ICD-10-CM | POA: Insufficient documentation

## 2019-05-07 DIAGNOSIS — R293 Abnormal posture: Secondary | ICD-10-CM | POA: Diagnosis not present

## 2019-05-07 NOTE — Therapy (Signed)
[facility] [address] [place], [place], [address] Phone: [phone]   Fax:  [phone]  Physical Therapy Evaluation  Patient Details  Name: Lindsay [name] MRN: [id number] Date of Birth: 1970-08-06 Referring Provider (PT): Velna Ochs, MD   Encounter Date: 05/07/2019  PT End of Session - 05/07/19 1310    Visit Number  1    Number of Visits  17    Date for PT Re-Evaluation  07/06/19    Authorization Type  [employer] Medicare & Medicaid    PT Start Time  0930    PT Stop Time  1016    PT Time Calculation (min)  46 min    Equipment Utilized During Treatment  Oxygen   arrived without her oxygen on & PT had her put on her oxygen   Activity Tolerance  Patient tolerated treatment well    Behavior During Therapy  [facility] for tasks assessed/performed       Past Medical History:  Diagnosis Date  . Acid reflux   . Anemia    Iron Def  . Anorexia   . Chronic kidney disease    Nephrotic syndrome  . Colon polyp 2009  . Depression with anxiety   . Edema leg   . Fibromyalgia   . Hemorrhoids   . Hidradenitis suppurativa   . Hypertension   . IBS (irritable bowel syndrome)   . Low back pain   . Migraines   . Morbidly obese ([place])   . Neuromuscular disorder (HCC)    fibromyalgia  . Neuropathy   . Panic attacks   . Polyp of vocal cord or larynx   . Tonsil pain     Past Surgical History:  Procedure Laterality Date  . AXILLARY HIDRADENITIS EXCISION    . COLONOSCOPY WITH PROPOFOL N/A 05/25/2015   Procedure: COLONOSCOPY WITH PROPOFOL;  Surgeon: Milus Banister, MD;  Location: [facility];  Service: Endoscopy;  Laterality: N/A;  . COLONOSCOPY WITH PROPOFOL N/A 12/31/2018   Procedure: COLONOSCOPY WITH PROPOFOL;  Surgeon: Milus Banister, MD;  Location: [facility];  Service: Endoscopy;  Laterality: N/A;  . HEMORRHOID SURGERY     with Hidradenitis surgery   . INGUINAL HIDRADENITIS EXCISION    . TONSILLECTOMY  10/18/2010   by Dr. Wilburn Cornelia  . UPPER GASTROINTESTINAL ENDOSCOPY      There were no vitals filed for this visit.   Subjective Assessment - 05/07/19 0931    Subjective  They wanted me to come here for an evaluation for my shoulder and for my legs.  Also having back pain.    Pertinent History  Chronic Kidney disease, Fibromyalgia, HTN, IBS, LBP, neuropathy,    Limitations  Walking;House hold activities;Standing;Lifting    How long can you sit comfortably?  10  mins    How long can you stand comfortably?  5-10 mins    How long can you walk comfortably?  <5 mins    Diagnostic tests  compression fracture at T3    Patient Stated Goals  To reduce pain, improve strength in legs and help shoulder    Currently in Pain?  Yes    Pain Score  10-Worst pain ever    Pain Location  Shoulder    Pain Orientation  Right    Pain Descriptors / Indicators  Sharp    Pain Type  Acute pain    Pain Radiating Towards  from neck all the way down to hand at times    Pain Onset  More than a month ago    Pain Frequency  Constant    Aggravating Factors   any movement    Pain Relieving Factors  nothing    Multiple Pain Sites  Yes    Pain Score  8    Pain Location  Back    Pain Orientation  Left;Mid    Pain Descriptors / Indicators  Throbbing    Pain Type  Chronic pain    Pain Radiating Towards  B hips/buttocks at times    Pain Onset  More than a month ago    Pain Frequency  Intermittent    Aggravating Factors   moving    Pain Relieving Factors  nothing         [facility] PT Assessment - 05/07/19 0938      Assessment   Medical Diagnosis  Back pain, LE weakness, and R shoulder pain    Referring Provider (PT)  Velna Ochs, MD    Onset Date/Surgical Date  03/06/19   R shoulder pain also in Feb 2021   Prior Therapy  therapy for neck and legs 2020      Precautions   Precautions  Fall    Precaution Comments  wear oxygen 4 liters      Restrictions   Weight Bearing Restrictions  No      Balance Screen   Has the  patient fallen in the past 6 months  No    Has the patient had a decrease in activity level because of a fear of falling?   Yes    Is the patient reluctant to leave their home because of a fear of falling?   Yes      [place]  Private residence    Living Arrangements  Children    Available Help at Discharge  Family;Available PRN/intermittently    Type of Home  House    Home Access  Stairs to enter;Other (comment)    Entrance Stairs-Number of Steps  2    Entrance Stairs-Rails  Right    Home Layout  One level    Home Equipment  Walker - 4 wheels   tub/shower, oxygen     Prior Function   Level of Independence  Needs assistance with ADLs    Leisure  likes to write poetry, used to like to dance     Comments  HH aide helps with bathing and light housework      Cognition   Overall Cognitive Status  Within Functional Limits for tasks assessed      Sensation   Light Touch  Impaired Detail    Light Touch Impaired Details  Impaired RUE;Impaired LUE;Impaired RLE;Impaired LLE   N/T in hands and feet, moving upward slightly    Hot/Cold  Appears Intact    Proprioception  Impaired by gross assessment      Coordination   Gross Motor Movements are Fluid and Coordinated  Yes   LEs   Fine Motor Movements are Fluid and Coordinated  Yes   LEs     ROM / Strength   AROM / PROM / Strength  AROM;Strength      AROM   Lumbar Flexion  25% ROM   limited by pain and hamstring tightness   Lumbar Extension  40%    Lumbar - Right Side Bend  WFL    Lumbar - Left Side Bend  slightly limited ROM, painful    Lumbar - Right Rotation  limited with pain at  mid back    Lumbar - Left Rotation  limited with pain at mid  back       Strength   Overall Strength  Deficits    Strength Assessment Site  Shoulder    Right/Left Shoulder  Right;Left    Right Shoulder Flexion  2/5   unable to perform full ROM 2/2 pain   Right Shoulder ABduction  2/5   unable to get full ROM 2/2 pain    Right Shoulder Internal Rotation  3+/5    Right Shoulder External Rotation  3/5    Left Shoulder Flexion  5/5    Left Shoulder ABduction  5/5    Left Shoulder Internal Rotation  5/5    Left Shoulder External Rotation  5/5    Right Hip Flexion  4/5    Right Hip ABduction  4/5    Left Hip Flexion  4/5    Left Hip ABduction  4/5    Right Knee Flexion  4/5    Right Knee Extension  4/5    Left Knee Flexion  3+/5   some pain from resistance from PT   Left Knee Extension  3+/5   some pain from resistance from PT   Right Ankle Dorsiflexion  5/5    Left Ankle Dorsiflexion  5/5      Flexibility   Soft Tissue Assessment /Muscle Length  yes    Hamstrings  Bilaterally tight      Transfers   Transfers  Sit to Stand;Stand to Sit    Five time sit to stand comments   16.78 secs with single UE use     Comments  Note that all mobility was done without oxygen as pt reports she has a longer walk for transportation once home and will need to conserve tank.  SaO2 throughout did not drop below 90% on RA.  HR up to 101 at times.       Ambulation/Gait   Ambulation/Gait  Yes    Ambulation/Gait Assistance  5: Supervision    Ambulation/Gait Assistance Details  No overt gait compensations, however with fatigue did note some slight Trendelenburg tendencies.     Ambulation Distance (Feet)  200 Feet    Assistive device  None    Gait velocity  3.07 ft/sec       High Level Balance   High Level Balance Comments  Did a form of modified CTSIB (did not have time for 3 trials nor further formal balance testing).  Pt able to make 30 secs on all                 Objective measurements completed on examination: See above findings.              PT Education - 05/07/19 1309    Education Details  Evaluation results, POC, transferring to [facility] for more orthopedic specific care.    Person(s) Educated  Patient    Methods  Explanation    Comprehension  Verbalized understanding       PT Short  Term Goals - 05/07/19 1319      PT SHORT TERM GOAL #1   Title  Patient verbalizes & demonstrates understanding of initial HEP. (All STGs Target Date: 06/06/19)    Time  4    Period  Weeks    Status  New    Target Date  06/06/19      PT SHORT TERM GOAL #2   Title  Patient reports 25% improvement in  mid to low back pain.    Time  4    Period  Weeks    Status  New      PT SHORT TERM GOAL #3   Title  Will have more formal R shoulder assessment and LTGs to be set accordingly.    Time  4    Period  Weeks    Status  New      PT SHORT TERM GOAL #4   Title  Will perform formal balance assessment and update LTG as needed.    Time  4    Period  Weeks    Status  New      PT SHORT TERM GOAL #5   Title  Patient ambulates with head turns to scan environment without change in pace or path x 300' with LRAD.    Time  4    Period  Weeks    Status  New        PT Long Term Goals - 05/07/19 1325      PT LONG TERM GOAL #1   Title  Patient verbalizes & demonstrates ongoing HEP / fitness plan. (All LTGs Target Date: 07/06/19)    Time  8    Period  Weeks    Status  New    Target Date  07/06/19      PT LONG TERM GOAL #2   Title  Patient reports mid to low back pain to </= 4/10 with activities.    Time  8    Period  Weeks    Status  New      PT LONG TERM GOAL #3   Title  Pt will report 50% improvement in R shoulder pain with functional activities.    Time  8    Period  Weeks    Status  New      PT LONG TERM GOAL #4   Title  Pt will improve BERG balance score by 8 points from baseline in order to indicate decreased fall risk.    Time  8    Period  Weeks    Status  New      PT LONG TERM GOAL #5   Title  Pt will ambulate x 500' w/ LRAD (and oxygen as needed) over unlevel outdoor paved surfaces to indicate safe community mobility.    Time  8    Period  Weeks    Status  New             Plan - 05/07/19 1311    Clinical Impression Statement  Pt is pleasant 49 y/o female with  back pain and R shoulder pain along with BLE weakness and pain.  Note T3 compression fracture that was discovered during ED visit on 03/06/19 and R shoulder has been bothering her since then.  Pt with history of COPD (on 4L O2 at all times, did not have on during session as she reports tank was getting low-sats remained 90 or better throughout), CKD, and fibromylagia.  Upon PT evaluation, note decreased strength and pain with all motions in R shoulder and limited lumbar mobility in all but lateral side bend along with pain.  Note 5TSS time of 16.78 secs without UE support indicative of decreased functional strength and fall risk, and gait speed of 3.07 ft/sec without device, which is below normal for age.  Pt also demonstrates significant postural sway with last condition of modified CTSIB.  Pt will benefit from skilled OP to address these deficits.  Will plan to transfer pt to [facility] for more orthopedic specific deficits.  Pt verbalized understanding.    Personal Factors and Comorbidities  Comorbidity 3+;Fitness;Past/Current Experience;Social Background;Time since onset of injury/illness/exacerbation    Comorbidities  Chronic Kidney disease, Fibromyalgia, HTN, IBS, LBP, neuropathy,    Examination-Activity Limitations  Bend;Caring for Others;Carry;Lift;Locomotion Level;Reach Overhead;Squat;Stairs;Stand;Transfers;Hygiene/Grooming;Dressing    Examination-Participation Restrictions  Community Activity;Laundry;Cleaning;Meal Prep    Stability/Clinical Decision Making  Evolving/Moderate complexity    Clinical Decision Making  Moderate    Rehab Potential  Good    PT Frequency  2x / week    PT Duration  8 weeks    PT Treatment/Interventions  ADLs/Self Care Home Management;Aquatic Therapy;Cryotherapy;Electrical Stimulation;Moist Heat;Traction;Ultrasound;DME Instruction;Gait training;Stair training;Functional mobility training;Therapeutic activities;Therapeutic exercise;Balance training;Neuromuscular  re-education;Patient/family education;Manual techniques;Passive range of motion;Dry needling;Vestibular;Spinal Manipulations;Joint Manipulations;Iontophoresis 4mg /ml Dexamethasone;Taping    PT Next Visit Plan  Please update goals as you see fit, more in depth R shoulder assessment (MD should have sent new order to include R shoulder), BERG, initiate HEP for postural strengthening, core, R shoulder    Consulted and Agree with Plan of Care  Patient       Patient will benefit from skilled therapeutic intervention in order to improve the following deficits and impairments:  Abnormal gait, Decreased activity tolerance, Cardiopulmonary status limiting activity, Decreased balance, Decreased endurance, Decreased knowledge of use of DME, Decreased mobility, Decreased range of motion, Decreased strength, Dizziness, Impaired flexibility, Postural dysfunction, Obesity, Pain, Impaired UE functional use, Impaired perceived functional ability, Improper body mechanics, Impaired sensation, Hypomobility  Visit Diagnosis: Muscle weakness (generalized)  Unsteadiness on feet  Other abnormalities of gait and mobility  Chronic midline low back pain without sciatica  Abnormal posture  Acute pain of right shoulder     Problem List Patient Active Problem List   Diagnosis Date Noted  . Acute pain of right shoulder 04/21/2019  . Abdominal pain, acute, epigastric 04/07/2019  . Pain in gums 03/15/2019  . Compression fracture of body of thoracic vertebra ([place]) 03/08/2019  . Acute respiratory failure ([place]) 03/06/2019  . URI (upper respiratory infection) 02/24/2019  . Iron deficiency 02/10/2019  . History of colon polyps   . Vision blurring 09/17/2018  . Fall 06/30/2018  . Frequent urination at night 04/23/2018  . Chronic cough 03/04/2018  . Chronic back pain 12/05/2017  . Dental infection 12/05/2017  . Right arm pain 12/05/2017  . Chronic respiratory failure with hypoxia ([place])   . Low serum vitamin B12  05/02/2017  . Palpitations 04/11/2017  . Nutritional anemia 06/17/2016  . Allergic rhinitis 06/12/2016  . Vitamin D deficiency, unspecified 06/12/2016  . Nicotine dependence 06/12/2016  . Menorrhagia 01/08/2016  . Migraines 01/08/2016  . Bilateral leg pain 11/29/2015  . Hemorrhoids 07/05/2015  . Fibromyalgia 07/05/2015  . Chronic leg pain 09/26/2014  . Healthcare maintenance 09/26/2014  . Morbid obesity ([place]) 07/04/2014  . Major depressive disorder, recurrent episode, moderate ([place]) 10/13/2012  . Generalized anxiety disorder 10/13/2012  . Vaginal yeast infection 11/27/2010  . Chronic nausea 05/10/2010  . IBS (irritable bowel syndrome) 06/16/2007  . Essential hypertension 02/06/2006  . HIDRADENITIS 11/18/2005    Cameron Sprang, PT, MPT [facility] [address] [place], [place], [address] Phone: [phone]   Fax:  [phone] 05/07/19, 1:29 PM  Name: Lindsay [name] MRN: [id number] Date of Birth: [birth year]

## 2019-05-07 NOTE — Telephone Encounter (Signed)
Dr. Philipp Ovens,   I evaluated Lindsay [name] this morning here at OP neuro for thoracic compression fracture.  She also reports R shoulder pain that was supposed to be included.  Could you please send an order in Epic for R shoulder pain so that we may address that also.     Thanks,  Cameron Sprang, PT, MPT [facility] [address] [place], [place], [address] Phone: [phone]   Fax:  [phone] 05/07/19, 12:50 PM

## 2019-05-07 NOTE — Telephone Encounter (Signed)
Done. Placed PT referral for right shoulder pain. Thanks!

## 2019-05-09 ENCOUNTER — Other Ambulatory Visit (HOSPITAL_COMMUNITY): Payer: Self-pay | Admitting: Psychiatry

## 2019-05-09 DIAGNOSIS — F331 Major depressive disorder, recurrent, moderate: Secondary | ICD-10-CM

## 2019-05-09 DIAGNOSIS — F411 Generalized anxiety disorder: Secondary | ICD-10-CM

## 2019-05-14 ENCOUNTER — Ambulatory Visit: Payer: Medicare Other | Admitting: Physical Therapy

## 2019-05-17 ENCOUNTER — Other Ambulatory Visit: Payer: Self-pay

## 2019-05-17 ENCOUNTER — Encounter (HOSPITAL_COMMUNITY): Payer: Self-pay | Admitting: Psychiatry

## 2019-05-17 ENCOUNTER — Ambulatory Visit (INDEPENDENT_AMBULATORY_CARE_PROVIDER_SITE_OTHER): Payer: Medicare Other | Admitting: Psychiatry

## 2019-05-17 DIAGNOSIS — F4312 Post-traumatic stress disorder, chronic: Secondary | ICD-10-CM | POA: Diagnosis not present

## 2019-05-17 DIAGNOSIS — F331 Major depressive disorder, recurrent, moderate: Secondary | ICD-10-CM | POA: Diagnosis not present

## 2019-05-17 DIAGNOSIS — F411 Generalized anxiety disorder: Secondary | ICD-10-CM

## 2019-05-17 DIAGNOSIS — F410 Panic disorder [episodic paroxysmal anxiety] without agoraphobia: Secondary | ICD-10-CM | POA: Diagnosis not present

## 2019-05-17 MED ORDER — REXULTI 3 MG PO TABS: 3.0000 mg | ORAL_TABLET | Freq: Every day | ORAL | 1 refills | Status: DC

## 2019-05-17 MED ORDER — LAMOTRIGINE 25 MG PO TABS: ORAL_TABLET | ORAL | 1 refills | Status: DC

## 2019-05-17 MED ORDER — HYDROXYZINE PAMOATE 50 MG PO CAPS: 50.0000 mg | ORAL_CAPSULE | Freq: Every evening | ORAL | 0 refills | Status: DC | PRN

## 2019-05-17 MED ORDER — CLORAZEPATE DIPOTASSIUM 3.75 MG PO TABS: 3.7500 mg | ORAL_TABLET | Freq: Every day | ORAL | 0 refills | Status: DC | PRN
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
## 2019-05-19 ENCOUNTER — Ambulatory Visit: Payer: Medicare Other | Admitting: Internal Medicine

## 2019-05-19 ENCOUNTER — Telehealth: Payer: Self-pay | Admitting: *Deleted

## 2019-05-19 NOTE — Telephone Encounter (Signed)
Pt did not come to her appt this morning; I called pt who stated she did not realize it was today. Stated she would like a cortisone injection to her knee and shoulder and to re-schedule her appt for later next week

## 2019-05-20 ENCOUNTER — Other Ambulatory Visit: Payer: Self-pay | Admitting: Pulmonary Disease

## 2019-05-24 NOTE — Telephone Encounter (Signed)
Lindsay [name], can you f/u to see if she has another primary care?

## 2019-05-25 ENCOUNTER — Other Ambulatory Visit: Payer: Self-pay

## 2019-05-25 ENCOUNTER — Other Ambulatory Visit: Payer: Self-pay | Admitting: Internal Medicine

## 2019-05-25 MED ORDER — LIDOCAINE 5 % EX OINT: 1.0000 "application " | TOPICAL_OINTMENT | Freq: Two times a day (BID) | CUTANEOUS | 0 refills | Status: DC | PRN

## 2019-05-25 MED ORDER — FAMOTIDINE 20 MG PO TABS: 20.0000 mg | ORAL_TABLET | Freq: Two times a day (BID) | ORAL | 0 refills | Status: DC

## 2019-05-25 MED ORDER — ALBUTEROL SULFATE HFA 108 (90 BASE) MCG/ACT IN AERS: 2.0000 | INHALATION_SPRAY | Freq: Four times a day (QID) | RESPIRATORY_TRACT | 5 refills | Status: DC | PRN

## 2019-05-25 NOTE — Telephone Encounter (Signed)
Last rx written 04/21/19. Last OV 3/24 in [facility]. Next OV has not been scheduled. UDS none

## 2019-05-25 NOTE — Telephone Encounter (Signed)
Called pt to schedule an appt - no answer, left message to call the office.

## 2019-05-25 NOTE — Telephone Encounter (Signed)
Refill for norco declined. This was meant to be a short term prescription and she has already had this refilled multiple times. She is not appropriate for chronic opioid therapy.

## 2019-05-26 NOTE — Telephone Encounter (Signed)
Patient is still a patient with [facility] .  Appt has been sch for 06/02/2019 with [facility] @ 1:15 pm.

## 2019-05-28 ENCOUNTER — Telehealth (INDEPENDENT_AMBULATORY_CARE_PROVIDER_SITE_OTHER): Payer: Medicare Other | Admitting: Licensed Clinical Social Worker

## 2019-05-28 DIAGNOSIS — F411 Generalized anxiety disorder: Secondary | ICD-10-CM

## 2019-05-28 DIAGNOSIS — F4312 Post-traumatic stress disorder, chronic: Secondary | ICD-10-CM

## 2019-05-28 DIAGNOSIS — F331 Major depressive disorder, recurrent, moderate: Secondary | ICD-10-CM
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
## 2019-05-29 ENCOUNTER — Other Ambulatory Visit: Payer: Self-pay | Admitting: Internal Medicine

## 2019-05-31 ENCOUNTER — Other Ambulatory Visit: Payer: Self-pay | Admitting: Gastroenterology

## 2019-05-31 ENCOUNTER — Other Ambulatory Visit: Payer: Self-pay | Admitting: Internal Medicine

## 2019-05-31 DIAGNOSIS — I10 Essential (primary) hypertension: Secondary | ICD-10-CM

## 2019-06-02 ENCOUNTER — Encounter: Payer: Self-pay | Admitting: Internal Medicine

## 2019-06-02 ENCOUNTER — Ambulatory Visit (INDEPENDENT_AMBULATORY_CARE_PROVIDER_SITE_OTHER): Payer: Medicare Other | Admitting: Internal Medicine

## 2019-06-02 ENCOUNTER — Telehealth: Payer: Self-pay | Admitting: Pulmonary Disease

## 2019-06-02 ENCOUNTER — Telehealth: Payer: Self-pay | Admitting: Gastroenterology

## 2019-06-02 ENCOUNTER — Other Ambulatory Visit: Payer: Self-pay | Admitting: Pulmonary Disease

## 2019-06-02 ENCOUNTER — Other Ambulatory Visit: Payer: Self-pay | Admitting: Gastroenterology

## 2019-06-02 VITALS — BP 157/98 | HR 103 | Temp 98.3°F | Ht 71.0 in | Wt 349.9 lb

## 2019-06-02 DIAGNOSIS — M25561 Pain in right knee: Secondary | ICD-10-CM

## 2019-06-02 DIAGNOSIS — M25562 Pain in left knee: Secondary | ICD-10-CM | POA: Diagnosis not present

## 2019-06-02 DIAGNOSIS — G8929 Other chronic pain: Secondary | ICD-10-CM

## 2019-06-02 DIAGNOSIS — M79604 Pain in right leg: Secondary | ICD-10-CM

## 2019-06-02 DIAGNOSIS — M25511 Pain in right shoulder: Secondary | ICD-10-CM

## 2019-06-02 DIAGNOSIS — M79605 Pain in left leg: Secondary | ICD-10-CM

## 2019-06-02 MED ORDER — PROMETHAZINE HCL 12.5 MG PO TABS: 12.5000 mg | ORAL_TABLET | Freq: Four times a day (QID) | ORAL | 1 refills | Status: DC | PRN

## 2019-06-02 MED ORDER — PREDNISONE 20 MG PO TABS: 40.0000 mg | ORAL_TABLET | Freq: Every day | ORAL | 0 refills | Status: DC

## 2019-06-02 NOTE — Telephone Encounter (Signed)
Encouraged to use nebulizer up to 4 times a day  We will call in prednisone for about 5 to 7 days-i will put in a prescription  If she starts prednisone and using nebulizer on a regular basis and she still feeling poorly tomorrow she can call in for sick visit-bring in tomorrow for myself or one of the APPs  If she is feeling sick with cough, mucus production then an antibiotic may be called in as well if she let us know

## 2019-06-02 NOTE — Patient Instructions (Signed)
It was a pleasure to see you today [name]. Please make the following changes:  During this visit you got a steroid injection of both your right shoulder and left knee.   For your breathing please follow up with pulmonology and psychiatry  If you have any questions or concerns, please call our clinic at [phone] between 9am-5pm and after hours call [phone] and ask for the internal medicine resident on call. If you feel you are having a medical emergency please call 911.   Thank you, we look forward to help you remain healthy!  Lars Mage, MD Internal Medicine PGY3

## 2019-06-02 NOTE — Telephone Encounter (Signed)
Patient requesting refill on Phenergan

## 2019-06-02 NOTE — Telephone Encounter (Signed)
Spoke with the pt and notified of recs per Dr Ander Slade. She verbalized understanding. Nothing further needed. Will call for appt/abx if not improving.

## 2019-06-02 NOTE — Progress Notes (Signed)
Called in prednisone  Encouraged to use nebulizer on a regular basis  Call in sick visit by 06/03/2019 if not feeling better-to be seen by myself or APP

## 2019-06-02 NOTE — Telephone Encounter (Signed)
Rx sent to pharmacy as requested.

## 2019-06-02 NOTE — Telephone Encounter (Signed)
Spoke with pt. States that she has been having increased shortness of breath. She had a panic attack last week and has been having issues with her breathing since. Pt is currently on 4L continuous and is still feeling like she can't catch her breath. While speaking to the pt, she was at her PCP but they advised her that they could not evaluate her breathing, she needed to call us. PCP checked her oxygen level and it was 99% on 1.5L, pt turned the oxygen down to conserve her oxygen while she is not at home. Denies having chest tightness, wheezing or coughing. Pt would like Dr. Judson Roch recommendations.  Dr. Ander Slade - please advise. Thanks.

## 2019-06-02 NOTE — Progress Notes (Signed)
CC: Bilateral knee pain and right shoulder evaluation  HPI:  [name] is a 49 y.o. female with chronic hypoxic respiratory failure, essential hypertension who presents for bilateral knee pain and right shoulder evaluation.  Past Medical History:  Diagnosis Date  . Acid reflux   . Anemia    Iron Def  . Anorexia   . Chronic kidney disease    Nephrotic syndrome  . Colon polyp 2009  . Depression with anxiety   . Edema leg   . Fibromyalgia   . Hemorrhoids   . Hidradenitis suppurativa   . Hypertension   . IBS (irritable bowel syndrome)   . Low back pain   . Migraines   . Morbidly obese ([place])   . Neuromuscular disorder (HCC)    fibromyalgia  . Neuropathy   . Panic attacks   . Polyp of vocal cord or larynx   . Tonsil pain    Review of Systems:    Dyspnea, anxiety Denies nausea, vomiting  Physical Exam:  Vitals:   06/02/19 1355  BP: (!) 157/98  Pulse: (!) 103  Temp: 98.3 F (36.8 C)  TempSrc: Oral  SpO2: 94%  Weight: (!) 349 lb 14.4 oz (158.7 kg)  Height: 5\' 11"  (1.803 m)   Physical Exam  Constitutional: She is oriented to person, place, and time. She appears well-developed and well-nourished. No distress.  HENT:  Head: Normocephalic and atraumatic.  Eyes: Conjunctivae are normal.  Cardiovascular: Normal rate, regular rhythm and normal heart sounds.  Respiratory: Effort normal and breath sounds normal. No respiratory distress. She has no wheezes.  Musculoskeletal:     Comments: Positive Neer's and Hawkins test.  4/5 strength in bilateral lower extremities.  Tenderness to palpation over patella bilaterally  Neurological: She is alert and oriented to person, place, and time.  Skin: Skin is warm and dry. She is not diaphoretic. No erythema.  Psychiatric: She has a normal mood and affect. Her behavior is normal. Judgment and thought content normal.     Shoulder Injection Procedure Note  Pre-operative Diagnosis: right shoulder  Indications: Symptom  relief from osteoarthritis  Anesthesia: Lidocaine 1% without epinephrine without added sodium bicarbonate  Procedure Details   Point of care ultrasound was used to identify the subacromial bursa and evaluate for rotator cuff tendinopathy or tears. Consent was obtained for the procedure. The shoulder was prepped with iodine. Using a 22 gauge needle the subacromial bursa was injected with 1 mL 1% lidocaine and 1 mL of triamcinolone (KENALOG) 40mg /ml. The needle was removed and a dressing was applied.  Complications:  None; patient tolerated the procedure well.  PROCEDURE NOTE  PROCEDURE: left knee joint steroid injection.  PREOPERATIVE DIAGNOSIS: Osteoarthritis of the left knee.  POSTOPERATIVE DIAGNOSIS: Osteoarthritis of the left knee.  PROCEDURE: The patient was apprised of the risks and the benefits of the procedure and informed consent was obtained, as witnessed by Dr. Heber [place]. Time-out procedure was performed, with confirmation of the patient's name, date of birth, and correct identification of the left knee to be injected. The patient's knee was then marked at the appropriate site for injection placement. The knee was sterilely prepped with Betadine. A 40 mg (1 milliliter) solution of Kenalog was drawn up into a 5 mL syringe with a 1 mL of 1% lidocaine. The patient was injected with a 25-gauge needle at the right inferolateral aspect of her left flexed knee. There were no complications. The patient tolerated the procedure well. There was minimal bleeding. The patient was  instructed to ice her knee upon leaving clinic and refrain from overuse over the next 3 days. The patient was instructed to go to the emergency room with any usual pain, swelling, or redness occurred in the injected area. The patient was given a followup appointment to evaluate response to the injection to his increased range of motion and reduction of pain.  The procedure was supervised by attending physician, Dr.  Heber [place].  Assessment & Plan:   See Encounters Tab for problem based charting.  Patient discussed with Dr. Heber [place]

## 2019-06-03 NOTE — Assessment & Plan Note (Addendum)
  Patient has had right shoulder pain over the past .  She states that it has become difficult to for her to carry her oxygen tank.  No recent imaging has been done to joint, but pcp requested steroid injection to be done.  Assessment and plan Patient had a positive Hawkins and Neer's test.  Will give right shoulder injection to help with pain alleviation.

## 2019-06-03 NOTE — Assessment & Plan Note (Addendum)
Patient has had bilateral knee pain over the past few years.  She states that her pain is 8/10 when sitting and 9/10 when she is standing.  Patient had bilateral knee injections in January 2021.  Patient's obesity is likely contributing to the pain.  Her BMI today is 48.8.  Plan The patient's knee pain is thought to be secondary to osteoarthritis.  There are no recent imaging found in EMR. Patient will get left knee steroid injection today 06/02/2019.

## 2019-06-04 DIAGNOSIS — G4733 Obstructive sleep apnea (adult) (pediatric): Secondary | ICD-10-CM | POA: Diagnosis not present

## 2019-06-04 NOTE — Progress Notes (Signed)
Internal Medicine Clinic Attending  I saw and evaluated the patient.  I personally confirmed the key portions of the history and exam documented by Dr. Maricela Bo and I reviewed pertinent patient test results.  The assessment, diagnosis, and plan were formulated together and I agree with the documentation in the resident's note.   I was present for both injections.

## 2019-06-05 ENCOUNTER — Telehealth: Payer: Self-pay | Admitting: Internal Medicine

## 2019-06-05 NOTE — Telephone Encounter (Signed)
Cough is worse 24 h despite neb every 4 hours and prednisone / no fever, minimally discolored sputum/ already on ppi ac daily / breathing is ok but cough is not   mucinex dm 1200 mg every 12 hours and phenergan 12.5 mg - 1-2 every 4 hours if needed and to ER if worse.   Needs to be seen asap on Monday 5/10 if passes covid screening questions - not clear from epic whether she's had vaccination so may need to be seen virtually

## 2019-06-06 DIAGNOSIS — J9620 Acute and chronic respiratory failure, unspecified whether with hypoxia or hypercapnia: Secondary | ICD-10-CM | POA: Diagnosis not present

## 2019-06-07 ENCOUNTER — Encounter: Payer: Self-pay | Admitting: Primary Care

## 2019-06-07 ENCOUNTER — Telehealth (INDEPENDENT_AMBULATORY_CARE_PROVIDER_SITE_OTHER): Payer: Medicare Other | Admitting: Primary Care

## 2019-06-07 DIAGNOSIS — J440 Chronic obstructive pulmonary disease with acute lower respiratory infection: Secondary | ICD-10-CM | POA: Diagnosis not present

## 2019-06-07 DIAGNOSIS — J209 Acute bronchitis, unspecified: Secondary | ICD-10-CM | POA: Diagnosis not present

## 2019-06-07 MED ORDER — PREDNISONE 20 MG PO TABS: 40.0000 mg | ORAL_TABLET | Freq: Every day | ORAL | 0 refills | Status: DC

## 2019-06-07 MED ORDER — DOXYCYCLINE HYCLATE 100 MG PO TABS: 100.0000 mg | ORAL_TABLET | Freq: Two times a day (BID) | ORAL | 0 refills | Status: DC

## 2019-06-07 MED ORDER — FLUTICASONE PROPIONATE 50 MCG/ACT NA SUSP
1.0000 | Freq: Every day | NASAL | 2 refills | Status: DC
Start: 2019-06-07 — End: 2019-09-22

## 2019-06-07 MED ORDER — CETIRIZINE HCL 10 MG PO TABS: 10.0000 mg | ORAL_TABLET | Freq: Every day | ORAL | 5 refills | Status: DC
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
## 2019-06-07 NOTE — Telephone Encounter (Signed)
Called and spoke with patient. She states she is slightly better since talking with doc this weekend. Patient unable to schedule with [facility] today due to transportation. She would rather do a video visit via link sent to phone.  Patient scheduled with BW at 1530 Nothing further needed at this time.

## 2019-06-07 NOTE — Patient Instructions (Signed)
  Obstructive Bronchitis: - Acute exacerbation - Rx doxycycline 1 tab twice daily x 7 days; additional prednisone 20mg  x 5 days - Continue Symbicort 160 two puffs twice daily; albuterol nebulizer q 4-6 hours prn shortness of breath or wheezing  Allergic rhinitis: - Resume Zyrtec 10mg  daily and flonase nasal spray   Panic disorder: - Continue to follow with be  Follow Up Instructions:   - Return if symptoms do not improve or worsen in 7-10 dayshavioral health

## 2019-06-08 ENCOUNTER — Telehealth: Payer: Self-pay

## 2019-06-08 NOTE — Telephone Encounter (Signed)
Received TC from Juliann Pulse, Therapist, sports at [employer].  She states "patient needs her RX for Valsartan called in, has been without RX for 1 week and her b/p is up".  [facility] nurse reports b/p of  195/129, O2 99%, P 84, R-22.  [facility] nurse reports patient is having no visual disturbances or other CNS symptoms.  RN informed [facility] nurse with b/p's this high, pt needs to go to ER for evaluation.  RN also informed [place] that Valsartan RX #180 tablets with 3 RF's was sent via [facility] on 06/01/19 by Dr. Philipp Ovens.  Pt states she didn't know this and doesn't have a way to pick up b/p medication.  [place] nurse states she will pick up meds today for patient.  Pt states she wants appt at [facility], declines ER, pt also declines [facility] appt today because of transportation issues (although [place] offering to assist w/ transporation), appt made for tomorrow 06/09/19 @ 0945.  Pt and [place] nurse informed if pt has visual changes, SOB, chest pain, or any other CNS symptoms to go to the ER, understanding verbalized. SChaplin, RN,BSN

## 2019-06-09 ENCOUNTER — Ambulatory Visit: Payer: Medicare Other

## 2019-06-12 ENCOUNTER — Other Ambulatory Visit: Payer: Self-pay | Admitting: Internal Medicine

## 2019-06-12 DIAGNOSIS — M25511 Pain in right shoulder: Secondary | ICD-10-CM

## 2019-06-14 ENCOUNTER — Encounter: Payer: Self-pay | Admitting: Internal Medicine

## 2019-06-14 ENCOUNTER — Ambulatory Visit (INDEPENDENT_AMBULATORY_CARE_PROVIDER_SITE_OTHER): Payer: Medicare Other | Admitting: Internal Medicine

## 2019-06-14 VITALS — BP 140/98 | HR 98 | Temp 98.4°F | Wt 356.6 lb

## 2019-06-14 DIAGNOSIS — J9611 Chronic respiratory failure with hypoxia: Secondary | ICD-10-CM

## 2019-06-14 DIAGNOSIS — G4733 Obstructive sleep apnea (adult) (pediatric): Secondary | ICD-10-CM

## 2019-06-14 DIAGNOSIS — L732 Hidradenitis suppurativa: Secondary | ICD-10-CM | POA: Diagnosis not present

## 2019-06-14 DIAGNOSIS — I10 Essential (primary) hypertension: Secondary | ICD-10-CM | POA: Diagnosis not present

## 2019-06-14 DIAGNOSIS — M797 Fibromyalgia: Secondary | ICD-10-CM | POA: Diagnosis not present

## 2019-06-14 MED ORDER — CLINDAMYCIN PHOSPHATE 1 % EX GEL: Freq: Two times a day (BID) | CUTANEOUS | 0 refills | Status: DC

## 2019-06-14 MED ORDER — VALSARTAN 80 MG PO TABS: 160.0000 mg | ORAL_TABLET | Freq: Every day | ORAL | 3 refills | Status: DC

## 2019-06-14 NOTE — Assessment & Plan Note (Signed)
BP Readings from Last 3 Encounters:  06/14/19 (!) 140/98  06/02/19 (!) 157/98  05/03/19 (!) 143/87   Presents for f/u for bp management. Currently on HCTZ, amlodipine, valsartan. Mentions being out of bp meds on previous visit but she has been taking for >7 days now. Bp improved compared to last visit but still above goal.  - C/w amlodipine, hctz - Increase valsartan to 160mg  daily - Bmp - F/u in 4 weeks

## 2019-06-14 NOTE — Assessment & Plan Note (Signed)
Continues to have diffuse pain of multiple joints. Received multiple joint steroid injections last clinic visit. Had some benefit but continues to endorse significant pain. Currently on cymbalta and lyrica for pain control. Previously referred to pain clinic.  - F/u with pain clinic

## 2019-06-14 NOTE — Assessment & Plan Note (Addendum)
Lindsay [name] is a 50 yo F w/ PMH of hidardenitis, COPD, chronic resp failure on 2L at home, obesity, OSA presenting to clinic with complaint of painful lesion on her R axilla. She states that she was in her usual state of health until 3 days prior when she had gradual onset worsening pain and growth of her L axilla. She mentions significant warmth and burning sensation of the axilla as well but denies any fevers, chills, nausea, vomiting. She mentions prior hx of similar boils in the past requiring surgical excision but was told by her surgeon's office that she needs a new referral.   A/P Lindsay [name] presents with new R axillary abscess associated with hidradenitits. Exquisitely tender, warm erythematous nodule. No fluctuance, sinus tract or obvious drainage. Previously treated with topical clindamycin in the past. Currently on doxycycline for 'COPD exacerbation' Likely will not improve with anti-microbial therapy alone as she was on day 4 of her doxy when this developed. Will refer for surgical management.  - Start topical clindamycin - Referral for general surgery - Having difficulty with pain control, have been taking naproxen and tylenol without relief, considering treatable acute issue, may benefit from short course of opioids but per chart review, previously referred to pain clinic. Will monitor for now.

## 2019-06-14 NOTE — Assessment & Plan Note (Signed)
Diagnosed in 03/2019. Received CPAP via pulm clinic. Has the machine at home but mentions not having been educated on usage. She states 'someone is scheduled to teach her how to use it this week' Mentions episodes of fatigue and tiredness.  - Discussed importance of daily adherence to CPAP use

## 2019-06-14 NOTE — Progress Notes (Signed)
CC: Boil on armpit  HPI: [name] is a 49 y.o. with PMH listed below presenting with complaint of new boil. Please see problem based assessment and plan for further details.  Past Medical History:  Diagnosis Date  . Acid reflux   . Anemia    Iron Def  . Anorexia   . Chronic kidney disease    Nephrotic syndrome  . Colon polyp 2009  . Depression with anxiety   . Edema leg   . Fibromyalgia   . Hemorrhoids   . Hidradenitis suppurativa   . Hypertension   . IBS (irritable bowel syndrome)   . Low back pain   . Migraines   . Morbidly obese ([place])   . Neuromuscular disorder (HCC)    fibromyalgia  . Neuropathy   . Panic attacks   . Polyp of vocal cord or larynx   . Tonsil pain     Review of Systems: Review of Systems  Constitutional: Positive for malaise/fatigue. Negative for chills and fever.  Eyes: Negative for blurred vision.  Respiratory: Positive for cough and shortness of breath. Negative for sputum production and wheezing.   Cardiovascular: Negative for chest pain and leg swelling.  Gastrointestinal: Negative for constipation, diarrhea, nausea and vomiting.  Genitourinary: Negative for dysuria.  Musculoskeletal: Positive for back pain, joint pain, myalgias and neck pain.  Neurological: Negative for dizziness and headaches.     Physical Exam: Vitals:   06/14/19 0945 06/14/19 1001  BP: (!) 160/107 (!) 140/98  Pulse: 98   Temp: 98.4 F (36.9 C)   TempSrc: Oral   SpO2: 97%   Weight: (!) 356 lb 9.6 oz (161.8 kg)    Physical Exam  Constitutional: She is oriented to person, place, and time. She appears well-developed.  Chronically ill-appearing  HENT:  Mouth/Throat: Oropharynx is clear and moist.  Eyes: Conjunctivae are normal.  Cardiovascular: Regular rhythm, normal heart sounds and intact distal pulses.  No murmur heard. Tachycardic  Respiratory: Effort normal and breath sounds normal. She has no wheezes. She has no rales.  GI: Soft. Bowel sounds  are normal. She exhibits no distension. There is no abdominal tenderness.  Musculoskeletal:        General: No edema. Normal range of motion.  Neurological: She is alert and oriented to person, place, and time.  Skin: Skin is warm and dry. There is erythema (Tender, warm, erythematous nodule on R axilla without drainge or fluctuance).        Assessment & Plan:   Hidradenitis Lindsay [name] is a 49 yo F w/ PMH of hidardenitis, COPD, chronic resp failure on 2L at home, obesity, OSA presenting to clinic with complaint of painful lesion on her R axilla. She states that she was in her usual state of health until 3 days prior when she had gradual onset worsening pain and growth of her L axilla. She mentions significant warmth and burning sensation of the axilla as well but denies any fevers, chills, nausea, vomiting. She mentions prior hx of similar boils in the past requiring surgical excision but was told by her surgeon's office that she needs a new referral.   A/P Lindsay [name] presents with new R axillary abscess associated with hidradenitits. Exquisitely tender, warm erythematous nodule. No fluctuance, sinus tract or obvious drainage. Previously treated with topical clindamycin in the past. Currently on doxycycline for 'COPD exacerbation' Likely will not improve with anti-microbial therapy alone as she was on day 4 of her doxy when this developed. Will refer for surgical management.  -  Start topical clindamycin - Referral for general surgery - Having difficulty with pain control, have been taking naproxen and tylenol without relief, considering treatable acute issue, may benefit from short course of opioids but per chart review, previously referred to pain clinic. Will monitor for now.  Fibromyalgia Continues to have diffuse pain of multiple joints. Received multiple joint steroid injections last clinic visit. Had some benefit but continues to endorse significant pain. Currently on cymbalta and lyrica for  pain control. Previously referred to pain clinic.  - F/u with pain clinic  Essential hypertension BP Readings from Last 3 Encounters:  06/14/19 (!) 140/98  06/02/19 (!) 157/98  05/03/19 (!) 143/87   Presents for f/u for bp management. Currently on HCTZ, amlodipine, valsartan. Mentions being out of bp meds on previous visit but she has been taking for >7 days now. Bp improved compared to last visit but still above goal.  - C/w amlodipine, hctz - Increase valsartan to 160mg  daily - Bmp - F/u in 4 weeks  OSA (obstructive sleep apnea) Diagnosed in 03/2019. Received CPAP via pulm clinic. Has the machine at home but mentions not having been educated on usage. She states 'someone is scheduled to teach her how to use it this week' Mentions episodes of fatigue and tiredness.  - Discussed importance of daily adherence to CPAP use  Chronic respiratory failure with hypoxia ([place]) Recently seen by pulmonology for worsening cough and shortness of breath. Was diagnosed with obstructive bronchitis and started on 7 days of doxycycline and 5 days of prednisone. Finished prednisone course without difficulty. Currently on day 6/7 of doxycycline. Mentions feeling dyspnea, fatigue and cough but denies any fevers, chills.  A/P On physical exam, lungs sound clear. O2 saturation wnl on RA. Would not continue further therapy for acute exacerbation. Likely also has component of OSA with daytime drowsiness. Scheduled to start CPAP this week. - C/w maintenance inhalers - Finish course of doxy, prednisone - Duonebs PRN - F/u with pulmnology - Start CPAP     Patient discussed with Dr. Rebeca Alert   -Gilberto Better, [facility] Pager: [phone]

## 2019-06-14 NOTE — Patient Instructions (Addendum)
Thank you for allowing [name] to provide your care today. Today we discussed your boil, fatigue, shortness of breath    I have ordered bmp labs for you. I will call if any are abnormal.    Today we made the following changes to your medications.    Please start clindamycin ointment Please follow up with [place] surgery Please take valsartan at increased dose Make sure to use your CPAP at home  Please follow-up as needed.    Should you have any questions or concerns please call the internal medicine clinic at [phone].    Clindamycin skin solution or gel What is this medicine? CLINDAMYCIN ([place] sin) is a lincosamide antibiotic. It is used on the skin to stop the growth of certain bacteria that cause acne. This medicine may be used for other purposes; ask your health care provider or pharmacist if you have questions. COMMON BRAND NAME(S): Cleocin T, Clinda-Derm, Clindagel, ClindaMax What should I tell my health care provider before I take this medicine? They need to know if you have any of these conditions:  diarrhea  inflammatory bowel disease  kidney or liver disease  stomach problems like colitis  an unusual or allergic reaction to clindamycin, lincomycin, other medicines, foods, dyes or preservatives  pregnant or trying to get pregnant  breast-feeding How should I use this medicine? This medicine is for external use only. Wash hands before and after use. Wash affected area and gently pat dry. Apply a thin layer of this medicine to the affected area as often as prescribed by your doctor or health care professional. Do not use skin products near the eyes, nose, or mouth. If you do get any in your eyes rinse out with plenty of cool tap water. Do not use your medicine more often than directed. Talk to your pediatrician regarding the use of this medicine in children. Special care may be needed. While this medicine may be prescribed for children as young as 12 years for  selected conditions, precautions do apply. Overdosage: If you think you have taken too much of this medicine contact a poison control center or emergency room at once. NOTE: This medicine is only for you. Do not share this medicine with others. What if I miss a dose? If you miss a dose, use it as soon as you can. If it is almost time for your next dose, use only that dose. Do not use double or extra doses. What may interact with this medicine?  medicated cosmetics, including coverup preparations  other acne products including benzoyl peroxide, salicylic acid, or tretinoin  skin care products that have a high alcohol content (some shaving creams, lotions, or after shave lotion)  some skin cleansers or medicated soaps This list may not describe all possible interactions. Give your health care provider a list of all the medicines, herbs, non-prescription drugs, or dietary supplements you use. Also tell them if you smoke, drink alcohol, or use illegal drugs. Some items may interact with your medicine. What should I watch for while using this medicine? Your acne should start to get better within about 6 weeks. Complete improvement may take longer. Tell your doctor or health care professional if you do not see any improvement. Your skin may get very dry and scale or peel. Let your doctor or health care professional know if this happens. Do not use any soothing cream or ointment without advice. What side effects may I notice from receiving this medicine? Side effects that you should  report to your doctor or health care professional as soon as possible:  allergic reactions like skin rash, itching or hives, swelling of the face, lips, or tongue  diarrhea that is watery or severe  pain on swallowing  stomach pain or cramps  unusual bleeding or bruising Side effects that usually do not require medical attention (report to your doctor or health care professional if they continue or are  bothersome):  dry skin  nausea, vomiting This list may not describe all possible side effects. Call your doctor for medical advice about side effects. You may report side effects to FDA at 1-800-FDA-1088. Where should I keep my medicine? Keep out of the reach of children. Store at room temperature between 20 and 25 degrees C (68 and 77 degrees F). Do not freeze. Throw away any unused medicine after the expiration date. NOTE: This sheet is a summary. It may not cover all possible information. If you have questions about this medicine, talk to your doctor, pharmacist, or health care provider.  2020 Elsevier/Gold Standard (2012-08-20 16:17:10)

## 2019-06-14 NOTE — Assessment & Plan Note (Addendum)
Recently seen by pulmonology for worsening cough and shortness of breath. Was diagnosed with obstructive bronchitis and started on 7 days of doxycycline and 5 days of prednisone. Finished prednisone course without difficulty. Currently on day 6/7 of doxycycline. Mentions feeling dyspnea, fatigue and cough but denies any fevers, chills.  A/P On physical exam, lungs sound clear. O2 saturation wnl on RA. Would not continue further therapy for acute exacerbation. Likely also has component of OSA with daytime drowsiness. Scheduled to start CPAP this week. - C/w maintenance inhalers - Finish course of doxy, prednisone - Duonebs PRN - F/u with pulmnology - Start CPAP

## 2019-06-15 ENCOUNTER — Telehealth (HOSPITAL_COMMUNITY): Payer: Self-pay

## 2019-06-15 ENCOUNTER — Other Ambulatory Visit (HOSPITAL_COMMUNITY): Payer: Self-pay | Admitting: Psychiatry

## 2019-06-15 ENCOUNTER — Other Ambulatory Visit: Payer: Self-pay | Admitting: Internal Medicine

## 2019-06-15 ENCOUNTER — Ambulatory Visit (INDEPENDENT_AMBULATORY_CARE_PROVIDER_SITE_OTHER): Payer: Medicare Other | Admitting: Licensed Clinical Social Worker

## 2019-06-15 ENCOUNTER — Telehealth: Payer: Self-pay

## 2019-06-15 DIAGNOSIS — F4312 Post-traumatic stress disorder, chronic: Secondary | ICD-10-CM

## 2019-06-15 DIAGNOSIS — F410 Panic disorder [episodic paroxysmal anxiety] without agoraphobia: Secondary | ICD-10-CM

## 2019-06-15 DIAGNOSIS — F411 Generalized anxiety disorder: Secondary | ICD-10-CM

## 2019-06-15 DIAGNOSIS — F331 Major depressive disorder, recurrent, moderate: Secondary | ICD-10-CM

## 2019-06-15 LAB — BMP8+ANION GAP
Anion Gap: 17 mmol/L (ref 10.0–18.0)
BUN/Creatinine Ratio: 15 (ref 9–23)
BUN: 14 mg/dL (ref 6–24)
CO2: 21 mmol/L (ref 20–29)
Calcium: 9.3 mg/dL (ref 8.7–10.2)
Chloride: 102 mmol/L (ref 96–106)
Creatinine, Ser: 0.92 mg/dL (ref 0.57–1.00)
GFR calc Af Amer: 85 mL/min/{1.73_m2} (ref >59)
GFR calc non Af Amer: 73 mL/min/{1.73_m2} (ref >59)
Glucose: 73 mg/dL (ref 65–99)
Potassium: 4.7 mmol/L (ref 3.5–5.2)
Sodium: 140 mmol/L (ref 134–144)

## 2019-06-15 MED ORDER — HYDROXYZINE PAMOATE 50 MG PO CAPS: 50.0000 mg | ORAL_CAPSULE | Freq: Every evening | ORAL | 0 refills | Status: DC | PRN

## 2019-06-15 MED ORDER — CLORAZEPATE DIPOTASSIUM 3.75 MG PO TABS: 3.7500 mg | ORAL_TABLET | Freq: Every day | ORAL | 0 refills | Status: DC | PRN

## 2019-06-15 NOTE — Telephone Encounter (Signed)
Patient called requesting a refill on her Hydroxyzine 50mg  and her Clonazepate 3.75mg . She uses [facility] [address] in [place].  Followup scheduled 07/20/19. Thank you

## 2019-06-15 NOTE — Progress Notes (Signed)
Internal Medicine Clinic Attending  Case discussed with Dr. Lee at the time of the visit.  We reviewed the resident's history and exam and pertinent patient test results.  I agree with the assessment, diagnosis, and plan of care documented in the resident's note.  Verina Galeno, M.D., Ph.D.  

## 2019-06-15 NOTE — Telephone Encounter (Signed)
Attempted to call Lindsay [name] to discuss arm pain. Patient did not pick up. Left voicemail with callback number.

## 2019-06-15 NOTE — Telephone Encounter (Signed)
I'll giver her a call. Thank you.

## 2019-06-15 NOTE — Telephone Encounter (Signed)
Return pt's call - stated her right arm is throbbing with pain. Naproxen and Tylenol not helping. Waiting on surg referral appt. She saw Dr Truman Hayward yesterday.Requesting something for the pain. Thanks

## 2019-06-15 NOTE — Telephone Encounter (Signed)
Done

## 2019-06-15 NOTE — Telephone Encounter (Signed)
Requesting to speak with a nurse about pain med, please call pt back.  
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
## 2019-06-16 ENCOUNTER — Telehealth: Payer: Self-pay | Admitting: Internal Medicine

## 2019-06-16 DIAGNOSIS — L732 Hidradenitis suppurativa: Secondary | ICD-10-CM

## 2019-06-16 MED ORDER — FAMOTIDINE 20 MG PO TABS: 20.0000 mg | ORAL_TABLET | Freq: Two times a day (BID) | ORAL | 0 refills | Status: DC

## 2019-06-16 MED ORDER — MELOXICAM 7.5 MG PO TABS: 7.5000 mg | ORAL_TABLET | Freq: Every day | ORAL | 0 refills | Status: DC

## 2019-06-16 MED ORDER — LIDOCAINE 5 % EX OINT: 1.0000 "application " | TOPICAL_OINTMENT | Freq: Two times a day (BID) | CUTANEOUS | 0 refills | Status: DC | PRN

## 2019-06-16 NOTE — Telephone Encounter (Signed)
Refill for Norco declined. Not appropriate for hidradenitis. Discussed with Dr. Truman Hayward.

## 2019-06-16 NOTE — Telephone Encounter (Signed)
Spoke with Lindsay [name] regarding her pain. She mentions that her arm / shoulder pain is still severe and would like alternative therapies. She states that she stopped her naproxen and Tylenol because they were ineffective. Discussed the appropriate indications for opioid medications and attempting trial with alternative methods of pain control. She is agreeable to trying different NSAID therapy. Script for meloxicam sent to pharmacy.

## 2019-06-17 ENCOUNTER — Ambulatory Visit (INDEPENDENT_AMBULATORY_CARE_PROVIDER_SITE_OTHER): Payer: Medicare Other | Admitting: Licensed Clinical Social Worker

## 2019-06-17 ENCOUNTER — Telehealth (HOSPITAL_COMMUNITY): Payer: Self-pay

## 2019-06-17 DIAGNOSIS — F331 Major depressive disorder, recurrent, moderate: Secondary | ICD-10-CM

## 2019-06-17 DIAGNOSIS — F411 Generalized anxiety disorder: Secondary | ICD-10-CM | POA: Diagnosis not present

## 2019-06-17 DIAGNOSIS — F4312 Post-traumatic stress disorder, chronic: Secondary | ICD-10-CM

## 2019-06-17 NOTE — Telephone Encounter (Signed)
She should take as needed. I told her that we will not prescribe more than 20 a month.
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
## 2019-06-17 NOTE — Telephone Encounter (Signed)
Called patient to notify her that her Hydroxyzine 50mg  & her Clonazepate 3.75mg  were sent in. She requested a 30 day supply of the Clonazepate instead of 20. Is it possible for it to be resent for #30? Followup appt scheduled for 07/20/19. Please review and advise. Thank you.

## 2019-06-18 NOTE — Telephone Encounter (Signed)
Spoke with patient and reiterated that she is only supposed to take the Clonazepate 3.75 mg PRN and that's why she's only prescribed 20 a month as opposed to #30 that she thought she was supposed to be getting. She said she misunderstood the doctor when he told her and she expressed verbal understanding when speaking with me

## 2019-06-21 ENCOUNTER — Telehealth: Payer: Self-pay | Admitting: Gastroenterology

## 2019-06-21 ENCOUNTER — Other Ambulatory Visit: Payer: Self-pay | Admitting: Gastroenterology

## 2019-06-21 MED ORDER — PROMETHAZINE HCL 12.5 MG PO TABS: 12.5000 mg | ORAL_TABLET | Freq: Four times a day (QID) | ORAL | 0 refills | Status: DC | PRN

## 2019-06-21 NOTE — Telephone Encounter (Signed)
Rx for promethazine sent to pharmacy as requested.

## 2019-06-22 ENCOUNTER — Telehealth: Payer: Self-pay

## 2019-06-22 ENCOUNTER — Ambulatory Visit: Payer: Medicare Other | Admitting: Physician Assistant

## 2019-06-22 DIAGNOSIS — L732 Hidradenitis suppurativa: Secondary | ICD-10-CM

## 2019-06-22 NOTE — Telephone Encounter (Signed)
Pt calls and states the meloxicam is not working, states dr Truman Hayward told her to just call and he would try something else. Please advise

## 2019-06-22 NOTE — Telephone Encounter (Signed)
Pt states   meloxicam (MOBIC) 7.5 MG tablet   Is not working, please call pt back.

## 2019-06-24 MED ORDER — BENZOCAINE (TOPICAL) 20 % EX OINT: 1.0000 "application " | TOPICAL_OINTMENT | CUTANEOUS | 0 refills | Status: DC | PRN

## 2019-06-24 NOTE — Telephone Encounter (Signed)
Spoke with Lindsay [name] regarding complaint of axillary pain due to her hidradenitis. She mentions having lack of relief with warm compresses, meloxicam and tylenol. She states pain is exacerbated by movement of her upper extremities. She states she has an upcoming appointment with her surgeon. Denies any significant fevers, chills, nausea, vomiting. Also mentions continuing to use topical clindamycin. Advised to continue current supportive measures and trial of Boil Ease for relief. Lindsay [name] expressed understanding.

## 2019-06-25 ENCOUNTER — Ambulatory Visit (INDEPENDENT_AMBULATORY_CARE_PROVIDER_SITE_OTHER): Payer: Medicare Other | Admitting: Licensed Clinical Social Worker

## 2019-06-25 DIAGNOSIS — F331 Major depressive disorder, recurrent, moderate: Secondary | ICD-10-CM

## 2019-06-25 DIAGNOSIS — F4312 Post-traumatic stress disorder, chronic: Secondary | ICD-10-CM | POA: Diagnosis not present

## 2019-06-25 DIAGNOSIS — F411 Generalized anxiety disorder: Secondary | ICD-10-CM | POA: Diagnosis not present

## 2019-06-25 NOTE — Progress Notes (Addendum)
Virtual Visit via Telephone Note-Patient at her home and therapist at her home  I connected with Lindsay [name] on 06/25/19 at  8:00 AM EDT by telephone and verified that I am speaking with the correct person using two identifiers.   I discussed the limitations, risks, security and privacy concerns of performing an evaluation and management service by telephone and the availability of in person appointments. I also discussed with the patient that there may be a patient responsible charge related to this service. The patient expressed understanding and agreed to proceed.   I discussed the assessment and treatment plan with the patient. The patient was provided an opportunity to ask questions and all were answered. The patient agreed with the plan and demonstrated an understanding of the instructions.   The patient was advised to call back or seek an in-person evaluation if the symptoms worsen or if the condition fails to improve as anticipated.  I provided 53 minutes of non-face-to-face time during this encounter.   THERAPIST PROGRESS NOTE  Session Time: 8:00 AM to 8:53 AM  Participation Level: Active  Behavioral Response: CasualAlertAnxious  Type of Therapy: Individual Therapy  Treatment Goals addressed:   help cope with health issues, depression, stress management Interventions: CBT, Solution Focused, Strength-based, Supportive and Other: coping strategies for anxiety-deep breathing, mindfulness  Summary: Lindsay [name] is a 49 y.o. female who presents with waiting to go to surgeon to manage her bulge on arm. Arm is leaking pus which means she can leak it out, drain out and able to make it less painful. Infection drained out but describes it as stinky. The surgeon will monitor the area. Normally drain it, if too severe put to sleep and cut out. Still out of breath, every time get out to walk leg hurt, butt and back. Carry around a lot weight. Referral for bariatric surgery. Physical therapy  start June 8 or June 9. For legs-neuropathy arthritis in legs, feet feel numb and tinging, Has COPD explored whether that impacts out of breath, normally not so out of breath where it is hard to walk from bed to bathroom. Reviewed other stressors and hasn't found out about legal case. Describes that she feeling kind of anxious. Found out rent increased $220 dollars. Lady for housing authority not easy to talk to. Also telling her that she owes money to them. Already stressed about money. So overwhelming hard even to fill out the paperwork but she did because had to meet the deadline. Shares she has been doing breathing exercises from [employer]. 3-5 minutes.  Therapist very enthusiastic patient has started to do this on her own. Shares she quit cigarette smoking but when a lot of stress wants to smoke a cigarette, overwhelming urge to smoke. Also negative result eating more, eating more candy. Started Chantix two days ago. Related it helped her go from a whole pack to two cigarettes, couldn't stop the last two but quit that when on her own without medication because smoking was taking breath away. Called "Quit now", and they are going to send her some lozenges. Nicotine replacement resources.  Therapist pointed out all the positive steps patient is taking and she relates "utilize what I can and do my part to help myself."  Shares helpful for therapy sessions to be able to get out what she needs to get out why she likes regular sessions.   Suicidal/Homicidal: No  Therapist Response: Therapist reviewed symptoms, facilitated expression of thoughts feelings, validated patient on how she was feeling related  to interactions with Contractor.  Discussed healthy management of patient's anxiety where she channeled energy to productively fill out paperwork for housing.  Explained staying anxiety mode can be unhelpful, how to manage most effectively is when we do what we can do to solve the problem and  then focus on something else instead of staying in an unproductive mode of anxiety state.  Provided education on management of anxiety includes relaxation exercises to decrease physical symptoms that are source of anxiety so added positive feedback for patient engaging in breathing exercises.  Explained regular practice will help decrease anxiety less triggered, able to better use exercises when stressed.  Introduced mindfulness as another type of relaxation exercise using 5 senses explain many of the benefits of mindfulness include decrease of depression anxiety symptoms, decrease of rumination Greater satisfaction with relationships improved memory focus and mental processing speed, improved ability to adapt to stressful situations, improved ability to manage emotions.  Also provided positive feedback for patient's motivation utilize resources to help her in her goal of quitting cigarette smoking.  Therapist provided strength based and supportive intervention   Plan: Return again in 2 weeks.2.Therapist work with patient on anxiety and stress management  Diagnosis: Axis I:   Major depressive disorder, recurrent, moderate, generalized anxiety disorder, chronic PTSD    Axis II: No diagnosis    [name], LCSW 06/25/2019

## 2019-06-30 ENCOUNTER — Ambulatory Visit: Payer: Medicare Other | Admitting: Physical Therapy

## 2019-07-08 ENCOUNTER — Ambulatory Visit (INDEPENDENT_AMBULATORY_CARE_PROVIDER_SITE_OTHER): Payer: Medicare Other | Admitting: Licensed Clinical Social Worker

## 2019-07-08 DIAGNOSIS — F331 Major depressive disorder, recurrent, moderate: Secondary | ICD-10-CM | POA: Diagnosis not present

## 2019-07-08 DIAGNOSIS — F411 Generalized anxiety disorder: Secondary | ICD-10-CM

## 2019-07-08 DIAGNOSIS — F4312 Post-traumatic stress disorder, chronic: Secondary | ICD-10-CM | POA: Diagnosis not present
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
## 2019-07-09 ENCOUNTER — Ambulatory Visit: Payer: Medicare Other

## 2019-07-11 ENCOUNTER — Other Ambulatory Visit (HOSPITAL_COMMUNITY): Payer: Self-pay | Admitting: Psychiatry

## 2019-07-11 ENCOUNTER — Other Ambulatory Visit: Payer: Self-pay | Admitting: Internal Medicine

## 2019-07-11 DIAGNOSIS — F331 Major depressive disorder, recurrent, moderate: Secondary | ICD-10-CM

## 2019-07-11 DIAGNOSIS — L732 Hidradenitis suppurativa: Secondary | ICD-10-CM

## 2019-07-14 ENCOUNTER — Other Ambulatory Visit (HOSPITAL_COMMUNITY): Payer: Self-pay | Admitting: *Deleted

## 2019-07-14 ENCOUNTER — Other Ambulatory Visit (HOSPITAL_COMMUNITY): Payer: Self-pay | Admitting: Psychiatry

## 2019-07-14 ENCOUNTER — Ambulatory Visit (INDEPENDENT_AMBULATORY_CARE_PROVIDER_SITE_OTHER): Payer: Medicare Other | Admitting: Licensed Clinical Social Worker

## 2019-07-14 DIAGNOSIS — F331 Major depressive disorder, recurrent, moderate: Secondary | ICD-10-CM

## 2019-07-14 DIAGNOSIS — F410 Panic disorder [episodic paroxysmal anxiety] without agoraphobia: Secondary | ICD-10-CM

## 2019-07-14 DIAGNOSIS — F4312 Post-traumatic stress disorder, chronic: Secondary | ICD-10-CM

## 2019-07-14 DIAGNOSIS — F411 Generalized anxiety disorder: Secondary | ICD-10-CM

## 2019-07-14 MED ORDER — CLORAZEPATE DIPOTASSIUM 3.75 MG PO TABS: 3.7500 mg | ORAL_TABLET | Freq: Every day | ORAL | 0 refills | Status: DC | PRN

## 2019-07-14 MED ORDER — HYDROXYZINE PAMOATE 50 MG PO CAPS: 50.0000 mg | ORAL_CAPSULE | Freq: Every evening | ORAL | 0 refills | Status: DC | PRN

## 2019-07-14 MED ORDER — LAMOTRIGINE 25 MG PO TABS: ORAL_TABLET | ORAL | 1 refills | Status: DC

## 2019-07-14 NOTE — Progress Notes (Signed)
Virtual Visit via Telephone Note  I connected with [name] on 07/14/19 at  4:00 PM EDT by telephone and verified that I am speaking with the correct person using two identifiers.   I discussed the limitations, risks, security and privacy concerns of performing an evaluation and management service by telephone and the availability of in person appointments. I also discussed with the patient that there may be a patient responsible charge related to this service. The patient expressed understanding and agreed to proceed.   I discussed the assessment and treatment plan with the patient. The patient was provided an opportunity to ask questions and all were answered. The patient agreed with the plan and demonstrated an understanding of the instructions.   The patient was advised to call back or seek an in-person evaluation if the symptoms worsen or if the condition fails to improve as anticipated.  I provided 50 minutes of non-face-to-face time during this encounter.  THERAPIST PROGRESS NOTE  Session Time: 4:00 PM to 4:50 PM  Participation Level: Active  Behavioral Response: CasualAlertAnxious and Euthymic  Type of Therapy: Individual Therapy  Treatment Goals addressed: help cope with health issues, anxiety, triggers, coping   Interventions: Solution Focused, Strength-based, Supportive and Other: grief, coping  Summary: Jalyah Weinheimer is a 49 y.o. female who presents with review of appointments to address health issues. Next week has an appointment with Dr. Adele Schilder, PCP, GI doctor, tomorrow physical body stiff and hurt. Going to ask her doctor for a muscle relaxant and short acting pain medication. She is in more pain then when she talked to therapist doesn't even want to go to bathroom and anything anybody  can do ask to do because of difficulty of moving. Discussed how much she is engaging in any form activity and only she does is get up to use the bathroom. Tried to get to store but  hurting too much. Can't do hobbies with how she feels. Church has [employer]. Has on Facebook live. Going to watch. Other people call it revival, enjoys it and misses it. Did it yesterday. Did it today. Catch one on Sunday. [employer]. Patient shared a little about her religion with therapist. Ex[plained she joined because first her daughter going there, field trips patient decided wanted to know what teaching the kids. Shared today anniversary of Dad's death and thought going to be sad and crying. Dad's birthday and taking it pretty well. Grounded herself with service, not sure but maybe why. It is not as it can be where it feels heavy and not weighing on her. He died in 35 when patient was 34. Discussed things she has gotten from her Dad and shares that he was a landcapper and planted flowers. 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 Discussed how songs are helpful listens to rap when mad and gets it off her chest. Listens 4-5 times, there is a hook and she will rap the hook with her own words afterwards and getts things off her chest. Shared limitations and that If felt better would write a book could write more poetry. Has books about poetry. Reviewed plans for the rest of the day and thinks she will watch service later tonight.         Suicidal/Homicidal: No  Therapist Response: Reviewed symptoms, facilitated expression of thoughts and feelings particularly as this is her father's birthday, noted patient is doing pretty well explored possibility of engagement with religious services as providing support and helpful in coping.  Discussed other  strategies for coping with grief such as Reflecting on the various qualities of the disease and how 1 has been impacted including mannerisms and gestures, ways of speaking or communicating, once work or hobbies, feelings about oneself and views of others, personality, views and believes.  Noted patient's connected to being a gardener as her  father was one too.  Reviewed) reviewed imprints that we would like to strengthen and does that need to be altered and severed.  Discussed how songs are helpful in bringing out emotions of feeling sad, angry, a song that helps tell the story, a song that brings comfort.  Identified since use of song for bringing out anger, creative way she is able to do that.  Provided positive feedback for patient preparing for tonight and helping her cope with anniversary of her dad's birthday.  Reviewed treatment plan and patient gave consent to complete by phone.  Identified very helpful in session to identify triggers to help her deal with triggers as sources of depression and anxiety.  Discussed one of the strategies and working on anxiety is getting to the source of anxiety which could be current or could be past history.  Therapist provided strength based and supportive intervention  Plan: Return again in 1 weeks.2.Address anxiety, triggers, depression  Diagnosis: Axis I: Major depressive disorder, recurrent, moderate, generalized anxiety disorder, chronic PTSD   Axis II: No diagnosis    [name], LCSW 07/14/2019

## 2019-07-15 ENCOUNTER — Ambulatory Visit: Payer: Medicare Other

## 2019-07-16 ENCOUNTER — Other Ambulatory Visit: Payer: Self-pay | Admitting: Gastroenterology

## 2019-07-20 ENCOUNTER — Other Ambulatory Visit: Payer: Self-pay

## 2019-07-20 ENCOUNTER — Encounter (HOSPITAL_COMMUNITY): Payer: Self-pay | Admitting: Psychiatry

## 2019-07-20 ENCOUNTER — Telehealth (INDEPENDENT_AMBULATORY_CARE_PROVIDER_SITE_OTHER): Payer: Medicare Other | Admitting: Psychiatry

## 2019-07-20 DIAGNOSIS — F411 Generalized anxiety disorder: Secondary | ICD-10-CM | POA: Diagnosis not present

## 2019-07-20 DIAGNOSIS — F410 Panic disorder [episodic paroxysmal anxiety] without agoraphobia: Secondary | ICD-10-CM | POA: Diagnosis not present

## 2019-07-20 DIAGNOSIS — F331 Major depressive disorder, recurrent, moderate: Secondary | ICD-10-CM

## 2019-07-20 DIAGNOSIS — F4312 Post-traumatic stress disorder, chronic: Secondary | ICD-10-CM

## 2019-07-20 MED ORDER — LAMOTRIGINE 100 MG PO TABS: ORAL_TABLET | ORAL | 0 refills | Status: DC

## 2019-07-20 MED ORDER — HYDROXYZINE PAMOATE 50 MG PO CAPS: 50.0000 mg | ORAL_CAPSULE | Freq: Every evening | ORAL | 1 refills | Status: DC | PRN

## 2019-07-20 MED ORDER — CLORAZEPATE DIPOTASSIUM 3.75 MG PO TABS: 3.7500 mg | ORAL_TABLET | Freq: Every day | ORAL | 1 refills | Status: DC

## 2019-07-20 MED ORDER — REXULTI 3 MG PO TABS: 3.0000 mg | ORAL_TABLET | Freq: Every day | ORAL | 1 refills | Status: DC
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
## 2019-07-21 ENCOUNTER — Other Ambulatory Visit: Payer: Self-pay

## 2019-07-21 ENCOUNTER — Ambulatory Visit (INDEPENDENT_AMBULATORY_CARE_PROVIDER_SITE_OTHER): Payer: Medicare Other | Admitting: Internal Medicine

## 2019-07-21 ENCOUNTER — Encounter: Payer: Self-pay | Admitting: Internal Medicine

## 2019-07-21 VITALS — BP 130/77 | HR 106 | Temp 98.9°F | Ht 70.0 in | Wt 368.1 lb

## 2019-07-21 DIAGNOSIS — M797 Fibromyalgia: Secondary | ICD-10-CM

## 2019-07-21 DIAGNOSIS — E611 Iron deficiency: Secondary | ICD-10-CM | POA: Diagnosis not present

## 2019-07-21 DIAGNOSIS — E559 Vitamin D deficiency, unspecified: Secondary | ICD-10-CM

## 2019-07-21 MED ORDER — GABAPENTIN 600 MG PO TABS: 600.0000 mg | ORAL_TABLET | Freq: Every day | ORAL | 2 refills | Status: DC

## 2019-07-21 NOTE — Progress Notes (Signed)
Subjective:   Patient ID: Lindsay [name] female   DOB: [birth year] 49 y.o.   MRN: [id number]  HPI: [name] is a 49 y.o. female with past medical history outlined below here for chronic pain follow up. For the details of today's visit, please refer to the assessment and plan.   Past Medical History:  Diagnosis Date  . Acid reflux   . Anemia    Iron Def  . Anorexia   . Chronic kidney disease    Nephrotic syndrome  . Colon polyp 2009  . Depression with anxiety   . Edema leg   . Fibromyalgia   . Hemorrhoids   . Hidradenitis suppurativa   . Hypertension   . IBS (irritable bowel syndrome)   . Low back pain   . Migraines   . Morbidly obese ([place])   . Neuromuscular disorder ([facility])    fibromyalgia  . Neuropathy   . Panic attacks   . Polyp of vocal cord or larynx   . Tonsil pain    Current Outpatient Medications  Medication Sig Dispense Refill  . acetaminophen (TYLENOL) 650 MG CR tablet Take 1,300 mg by mouth every 8 (eight) hours as needed for pain.    [name] albuterol (PROVENTIL) (2.5 MG/3ML) 0.083% nebulizer solution Take 3 mLs (2.5 mg total) by nebulization every 4 (four) hours. 75 mL 12  . albuterol (VENTOLIN HFA) 108 (90 Base) MCG/ACT inhaler Inhale 2 puffs into the lungs every 6 (six) hours as needed for wheezing or shortness of breath. 8.5 g 5  . amLODipine (NORVASC) 10 MG tablet TAKE 1/2 TABLET BY MOUTH EVERY DAY (Patient taking differently: Take 5 mg by mouth daily. ) 45 tablet 1  . Ascorbic Acid (VITAMIN C PO) Take 1 tablet by mouth daily.    . Aspirin-Salicylamide-Caffeine (BC HEADACHE POWDER PO) Take 2 Packages by mouth as needed (headache).    . Benzocaine 20 % OINT Apply 1 application topically as needed. 28 g 0  . BIOTIN PO Take 1 tablet by mouth daily.    . Brexpiprazole (REXULTI) 3 MG TABS Take 1 tablet (3 mg total) by mouth daily. 30 tablet 1  . cetirizine (ZYRTEC) 10 MG tablet Take 1 tablet (10 mg total) by mouth daily. 30 tablet 5  . CHANTIX CONTINUING MONTH  PAK 1 MG tablet TAKE 1 TABLET BY MOUTH TWICE A DAY 56 tablet 3  . cholecalciferol (VITAMIN D) 1000 units tablet Take 1 tablet (1,000 Units total) by mouth daily. 30 tablet 3  . clindamycin (CLINDAGEL) 1 % gel APPLY TO AFFECTED AREA TWICE A DAY 30 g 0  . clorazepate (TRANXENE) 3.75 MG tablet Take 1 tablet (3.75 mg total) by mouth daily. 30 tablet 1  . doxycycline (VIBRA-TABS) 100 MG tablet Take 1 tablet (100 mg total) by mouth 2 (two) times daily. 14 tablet 0  . famotidine (PEPCID) 20 MG tablet Take 1 tablet (20 mg total) by mouth 2 (two) times daily for 14 days. 28 tablet 0  . fluticasone (FLONASE) 50 MCG/ACT nasal spray Place 1 spray into both nostrils daily. 16 g 2  . gabapentin (NEURONTIN) 600 MG tablet Take 1 tablet (600 mg total) by mouth at bedtime. 30 tablet 2  . hydrochlorothiazide (HYDRODIURIL) 25 MG tablet TAKE 1 TABLET BY MOUTH EVERY DAY 90 tablet 1  . HYDROcodone-acetaminophen (NORCO) 7.5-325 MG tablet Take 1 tablet by mouth every 8 (eight) hours as needed for up to 5 days for severe pain. 15 tablet 0  . hydrOXYzine (VISTARIL) 50  MG capsule Take 1 capsule (50 mg total) by mouth at bedtime as needed for anxiety. 33 capsule 1  . lamoTRIgine (LAMICTAL) 100 MG tablet Take one tab daily 90 tablet 0  . lidocaine (XYLOCAINE) 5 % ointment Apply 1 application topically 2 (two) times daily as needed. 35.44 g 0  . meloxicam (MOBIC) 7.5 MG tablet Take 1-2 tablets (7.5-15 mg total) by mouth daily. 20 tablet 0  . omeprazole (PRILOSEC) 40 MG capsule TAKE 1 CAPSULE BY MOUTH EVERY DAY 90 capsule 3  . OXYGEN Inhale 4 L into the lungs.    . Probiotic Product (PROBIOTIC PO) Take 1 capsule by mouth daily.    . promethazine (PHENERGAN) 12.5 MG tablet TAKE 1 TABLET BY MOUTH EVERY 6 HOURS AS NEEDED FOR NAUSEA OR VOMITING. 90 tablet 0  . SYMBICORT 160-4.5 MCG/ACT inhaler TAKE 2 PUFFS BY MOUTH TWICE A DAY (Patient taking differently: Inhale 2 puffs into the lungs in the morning and at bedtime. ) 30.6 Inhaler 1    . valsartan (DIOVAN) 80 MG tablet Take 2 tablets (160 mg total) by mouth daily. 180 tablet 3  . vitamin B-12 (CYANOCOBALAMIN) 1000 MCG tablet Take 1 tablet (1,000 mcg total) by mouth daily. 30 tablet 3  . VITAMIN E PO Take 1 capsule by mouth daily.     Current Facility-Administered Medications  Medication Dose Route Frequency Provider Last Rate Last Admin  . ferumoxytol (FERAHEME) injection 510 mg  510 mg Intravenous Once Chundi, Verne Spurr, MD       Family History  Problem Relation Age of Onset  . Diabetes Mother   . Heart disease Mother        valve leak  . Anxiety disorder Mother   . Depression Mother   . High blood pressure Mother   . Kidney failure Mother   . Cancer Father        prostate  . Heart disease Father   . Learning disabilities Sister   . Depression Sister   . Depression Sister   . Anxiety disorder Sister   . Colon cancer Neg Hx    Social History   Socioeconomic History  . Marital status: Single    Spouse name: Not on file  . Number of children: 1  . Years of education: 61  . Highest education level: Not on file  Occupational History  . Occupation: Disabled  Tobacco Use  . Smoking status: Former Smoker    Packs/day: 0.10    Years: 25.00    Pack years: 2.50    Types: Cigarettes    Quit date: 05/24/2019    Years since quitting: 0.1  . Smokeless tobacco: Never Used  . Tobacco comment: quit in April.  Vaping Use  . Vaping Use: Never used  Substance and Sexual Activity  . Alcohol use: Not Currently    Alcohol/week: 0.0 standard drinks    Comment: none sine 05/2018  . Drug use: Not Currently    Frequency: 3.0 times per week    Types: Marijuana  . Sexual activity: Not Currently    Birth control/protection: Implant  Other Topics Concern  . Not on file  Social History Narrative   Lives at home w/ her mother and daughter   Right-handed   Caffeine: about 3 Cokes per week   Social Determinants of Health   Financial Resource Strain:   . Difficulty of  Paying Living Expenses:   Food Insecurity:   . Worried About Charity fundraiser in the Last Year:   .  Ran Out of Food in the Last Year:   Transportation Needs:   . Film/video editor (Medical):   [name] Lack of Transportation (Non-Medical):   Physical Activity:   . Days of Exercise per Week:   . Minutes of Exercise per Session:   Stress:   . Feeling of Stress :   Social Connections:   . Frequency of Communication with Friends and Family:   . Frequency of Social Gatherings with Friends and Family:   . Attends Religious Services:   . Active Member of Clubs or Organizations:   . Attends Archivist Meetings:   [name] Marital Status:     Review of Systems: Review of Systems  Respiratory: Positive for shortness of breath.   Musculoskeletal: Positive for back pain, joint pain and myalgias.     Objective:  Physical Exam:  Vitals:   07/21/19 0843  BP: 130/77  Pulse: (!) 106  Temp: 98.9 F (37.2 C)  TempSrc: Oral  SpO2: 95%  Weight: (!) 368 lb 1.6 oz (167 kg)  Height: 5\' 10"  (1.778 m)    Physical Exam Constitutional:      Appearance: She is obese.  Cardiovascular:     Rate and Rhythm: Normal rate and regular rhythm.     Heart sounds: Normal heart sounds.  Pulmonary:     Effort: Pulmonary effort is normal.     Comments: Decreased breath sounds throughout, on supplemental oxygen  Skin:    General: Skin is warm and dry.  Neurological:     Mental Status: She is alert.  Psychiatric:        Mood and Affect: Mood normal.        Behavior: Behavior normal.      Assessment & Plan:   See Encounters Tab for problem based charting.

## 2019-07-21 NOTE — Assessment & Plan Note (Signed)
Requires intermittent IV iron infusions. Repeat iron studies today.

## 2019-07-21 NOTE — Assessment & Plan Note (Signed)
Patient has chronic diffuse pain in multiple joins as well as soft tissue pain. Her pain is consistently 10/10. We have tried multiple therapies which she finds ineffective. She discontinued her Lyrica this past December. We tapered her off duloxetine to allow for other medication tx per psych. She continues to request opioids which we have discussed are not appropriate for chronic management of fibromyalgia. She was previously referred to a pain clinic but does not wish to return to the same provider. She is requesting referral to a different pain clinic. She has tried gabapentin in the past but says she required "very high doses" and it made her sleepy. Today she tells me the pain is limiting her ability to sleep at night. I have recommended we try gabapentin QHS as this side effect may be beneficial. We also discussed the importance of weight loss for her joints and chronic pain. She is planning to attend the bariatric surgery seminar.  -- Start gabapentin 600 mg QHS, can increase if needed (previously on 800 TID) -- Declines to restart lyrica or duloxetine. Says tylenol / ibuprofen are ineffective.  -- Pain clinic referral -- Advised weight loss

## 2019-07-21 NOTE — Assessment & Plan Note (Signed)
We had a long conversation today about her continued weight gain, morbid obesity, and resulting health complications. She has gained another 12 lbs in the past month. Her BMI today is 52. She has multiple complications from her obesity including chronic hypoxia requiring oxygen, OSA / OHS, chronic pain in multiple joins, HTN, and recent issues with hidradenitis requiring I&D. We discussed the option of bariatric surgery. I think she would greatly benefit. She is open to the idea. I provided her information on our bariatric seminars and how to schedule. I will place the referral to general surgery if she is still interested after the seminar.

## 2019-07-21 NOTE — Patient Instructions (Signed)
Ms. Steinhauser,  It was a pleasure to see you today. For your pain, I have placed a referral to a pain clinic and started you on gabapentin at night before bed.   I will call you with the results of your blood work today.   Please follow up with your dentist for your gum infection.   I have provided information on our bariatric surgery seminars. Please attend one of the seminars for more information.   Follow up with me again in 6 months. If you have any questions or concerns, call our clinic at [phone] or after hours call [phone] and ask for the internal medicine resident on call.  Thank you!  Dr. Philipp Ovens

## 2019-07-21 NOTE — Assessment & Plan Note (Addendum)
Patient has chronic vitamin D deficiency. Has not filled her Rx for supplements since 2019, says she has been taking OTC instead. Will repeat Vit D level. Suspect she will need prescription strength dosing.   ADDENDUM: Vitamin D level is low. Plan to restart prescription strength dosing 1,000 units daily. Called patient with plan, sent prescription to her pharmacy.

## 2019-07-22 ENCOUNTER — Ambulatory Visit: Payer: Medicare Other | Admitting: Physician Assistant

## 2019-07-22 LAB — VITAMIN D 25 HYDROXY (VIT D DEFICIENCY, FRACTURES): Vit D, 25-Hydroxy: 21.3 ng/mL — ABNORMAL LOW (ref 30.0–100.0)

## 2019-07-22 LAB — FERRITIN: Ferritin: 88 ng/mL (ref 15–150)

## 2019-07-23 ENCOUNTER — Ambulatory Visit (INDEPENDENT_AMBULATORY_CARE_PROVIDER_SITE_OTHER): Payer: Medicare Other | Admitting: Licensed Clinical Social Worker

## 2019-07-23 DIAGNOSIS — F410 Panic disorder [episodic paroxysmal anxiety] without agoraphobia: Secondary | ICD-10-CM | POA: Diagnosis not present

## 2019-07-23 DIAGNOSIS — F4312 Post-traumatic stress disorder, chronic: Secondary | ICD-10-CM

## 2019-07-23 DIAGNOSIS — F331 Major depressive disorder, recurrent, moderate: Secondary | ICD-10-CM

## 2019-07-23 DIAGNOSIS — F411 Generalized anxiety disorder: Secondary | ICD-10-CM | POA: Diagnosis not present

## 2019-07-23 MED ORDER — VITAMIN D3 25 MCG (1000 UNIT) PO TABS: 1000.0000 [IU] | ORAL_TABLET | Freq: Every day | ORAL | 1 refills | Status: DC

## 2019-07-23 NOTE — Addendum Note (Signed)
Addended by: Jodean Lima on: 07/23/2019 01:59 PM   Modules accepted: Orders

## 2019-07-23 NOTE — Progress Notes (Signed)
Virtual Visit via Telephone Note  Therapist-home office Patient-home I connected with [name] on 07/23/19 at  8:00 AM EDT by telephone and verified that I am speaking with the correct person using two identifiers.   I discussed the limitations, risks, security and privacy concerns of performing an evaluation and management service by telephone and the availability of in person appointments. I also discussed with the patient that there may be a patient responsible charge related to this service. The patient expressed understanding and agreed to proceed.   I discussed the assessment and treatment plan with the patient. The patient was provided an opportunity to ask questions and all were answered. The patient agreed with the plan and demonstrated an understanding of the instructions.   The patient was advised to call back or seek an in-person evaluation if the symptoms worsen or if the condition fails to improve as anticipated.  I provided 20 minutes of non-face-to-face time during this encounter.  THERAPIST PROGRESS NOTE  Session Time: 8:00 AM to 8:20 AM  Participation Level: Active  Behavioral Response: CasualAlertAnxious and patient dealing with significant pain issues so mood was depressed as well as anxious  Type of Therapy: Individual Therapy  Treatment Goals addressed: help cope with health issues, anxiety, triggers, coping   Interventions: Solution Focused, Strength-based, Supportive and Other: strategies to address pain issues, coping  Summary: Lindsay [name] is a 49 y.o. female who presents with in a lot of pain and didn't sleep. Tingling and numbing up from feet to knee and hurts. She has neuropathy. Pain is probably degenerative disc disease, arthritis issues, muscular issues. Haven't been out of the house since last session except to go to doctors. Doctor wouldn't give her opiate and will make a referral to a pain clinic. Wants her to take Neurontin but hasn't helped.  Reviewed the drug summary and said to be careful if have COPD make her think doesn't even want to take it. Wants to talk to doctor about it. Hasn't been on Headspace in week and half. Lamictal is making her sleeping, so wants to see if can break in half and take one part and then the other half 6 hours. Patient reviewed plan for weight loss 6 visits with nutritonist and look at a webinair and once she has done that they can proceed sending notification of recommendation of weight loss.  Therapist reviewed patient's med change and how that could be helpful for her symptoms of anxiety and depression, irritability.  Discussed patient going to nutritionist and webinar and explained from her own experience could get very helpful information for weight loss, for strategies to be healthier.  Validated patient how she was feeling related to significant pain issues, discussing strategy of going to pain clinic is probably best strategy given patient's medication and medical issues better to be handled by specialist.  Discussed mindfulness as helpful for pain briefly reviewed practice.  Look for other strategies that may help patient with pain issues.  Of note pain issue is more severe than last week.  Therapist provided open questions, active listening, therapeutic support.  Patient sleepy so ended session sooner and also on pain, discussed how sleep helpful when not feeling well.  Suicidal/Homicidal: No  Plan: Return again in 1 week. 2.Address anxiety, triggers, depression, therapist look for therapeutic strategies to manage pain.  Diagnosis: Axis I: Major depressive disorder, recurrent, moderate, generalized anxiety disorder, chronic PTSD, panic attacks   Axis II: No diagnosis    [name], LCSW 07/23/2019

## 2019-07-28 ENCOUNTER — Ambulatory Visit (INDEPENDENT_AMBULATORY_CARE_PROVIDER_SITE_OTHER): Payer: Medicare Other | Admitting: Licensed Clinical Social Worker

## 2019-07-28 DIAGNOSIS — F411 Generalized anxiety disorder: Secondary | ICD-10-CM | POA: Diagnosis not present

## 2019-07-28 DIAGNOSIS — F331 Major depressive disorder, recurrent, moderate: Secondary | ICD-10-CM | POA: Diagnosis not present

## 2019-07-28 DIAGNOSIS — F410 Panic disorder [episodic paroxysmal anxiety] without agoraphobia: Secondary | ICD-10-CM

## 2019-07-28 DIAGNOSIS — F4312 Post-traumatic stress disorder, chronic: Secondary | ICD-10-CM | POA: Diagnosis not present

## 2019-07-28 NOTE — Progress Notes (Signed)
Virtual Visit via Telephone Note  Therapist-office Patient-home I connected with Lindsay [name] on 07/28/19 at  9:00 AM EDT by telephone and verified that I am speaking with the correct person using two identifiers.   I discussed the limitations, risks, security and privacy concerns of performing an evaluation and management service by telephone and the availability of in person appointments. I also discussed with the patient that there may be a patient responsible charge related to this service. The patient expressed understanding and agreed to proceed.   I discussed the assessment and treatment plan with the patient. The patient was provided an opportunity to ask questions and all were answered. The patient agreed with the plan and demonstrated an understanding of the instructions.   The patient was advised to call back or seek an in-person evaluation if the symptoms worsen or if the condition fails to improve as anticipated.  I provided 51 minutes of non-face-to-face time during this encounter.   THERAPIST PROGRESS NOTE  Session Time: 9:00 AM to 9:51 AM  Participation Level: Active  Behavioral Response: CasualAlertAnxious and Dysphoric  Type of Therapy: Individual Therapy  Treatment Goals addressed: help cope with health issues, anxiety, triggers, coping   Interventions: Solution Focused, Strength-based, Supportive and Other: coping  Summary: Lindsay [name] is a 49 y.o. female who presents with taking Neurontin, labs show a Vitamin D deficiency, waiting for pain clinic referral.  Lindsay [name] PCP. The pain is same if not worse. Up all night. Took the Lamictal dose off to sleep before therapist called so had to get up.. Hard to think about other things when dealing pain. Family is helping, has nursing aid every day. Only so much can ask her with her own issues. Can't ask her to get into tub and shower. Will help her and has before but the way nurse aid is feeling now she can't adapt  to patient's pain issue or weight. Patient is trying to manage pain but so difficult. Nothing helping. Sleep as much as could to help with pain. Hates ending the call like did last week. Plans to call internal medicine ask when and where going. Therapist relates need to do that because dealing with this sooner than later. When stand up lean back because a curve in the back. Lean backwards and hurts down to her butt. Martin Majestic out one time to get Mom some things maybe Saturday. It was awful. Hurried and got out of there. Huffing and puffing. Hard to get to the car. Discussed pain really bad now, not bad at first but now here to stay. Distractions, mindful thinking not helping. nenstrual happens for over 20 days and pain bad. Irregular cycles. Try to regulate in with implant. Has a thing in arm that looks like a little needle for birth control to regulate menstrual cycles but not regulated. Barely get to bathroom, hard to get up from bed on floor. With her day mostly sleeping, try to watch movies but never get comfortable. Discussion about movies that she enjoys. Enjoys different types of movies.  Shared reviewed she plans to watch today.       Suicidal/Homicidal: No  Therapist Response: Therapist reviewed symptoms, facilitated expression of thoughts and feelings, offered distraction during session to the patient's severe pain issues discussing her interests of watching movies, also discussed watching movies is a very helpful coping strategy, humor as very effective for coping with stressors in life.  Discussed sleeping is helpful given how bad the pain feels.  Therapist validated patient  on how she was feeling.  Discussion of films led to therapist input of how trauma helps with cathectic relief why it can be helpful, release of emotion helps relieve mood and therapy does the same thing.  Discussed negative events in the news and therapist reviewed managing impact on her lives that people who live comfortably with  life become more comfortable with the unknown which is part of life knowing the risk is low and better not to limit once life.  Also attitudes helpful for coping are letting go and acceptance, discussed in terms of past events working on getting to a better place being less distressed viewing it differently to relieve symptoms.  Therapist offered active listening, supportive interventions, open questions. Reviewed pain management strategies  Plan: Return again in 1 week.2.  Therapist continue to work with patient on treatment goal of supporting patient in managing pain issues, stress management, problem solving around stress issues.  Therapist work with patient on depression, anxiety, processing through feelings related to  Diagnosis: Axis I: Major depressive disorder, recurrent, moderate, generalized anxiety disorder, chronic PTSD, panic attacks   Axis II: No diagnosis    [name], LCSW 07/28/2019

## 2019-08-03 ENCOUNTER — Other Ambulatory Visit: Payer: Self-pay | Admitting: Pulmonary Disease

## 2019-08-05 ENCOUNTER — Ambulatory Visit (INDEPENDENT_AMBULATORY_CARE_PROVIDER_SITE_OTHER): Payer: Medicare Other | Admitting: Licensed Clinical Social Worker

## 2019-08-05 ENCOUNTER — Other Ambulatory Visit (HOSPITAL_COMMUNITY): Payer: Self-pay | Admitting: Psychiatry

## 2019-08-05 DIAGNOSIS — F4312 Post-traumatic stress disorder, chronic: Secondary | ICD-10-CM

## 2019-08-05 DIAGNOSIS — F411 Generalized anxiety disorder: Secondary | ICD-10-CM | POA: Diagnosis not present

## 2019-08-05 DIAGNOSIS — F410 Panic disorder [episodic paroxysmal anxiety] without agoraphobia: Secondary | ICD-10-CM | POA: Diagnosis not present

## 2019-08-05 DIAGNOSIS — F331 Major depressive disorder, recurrent, moderate: Secondary | ICD-10-CM
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
## 2019-08-10 ENCOUNTER — Other Ambulatory Visit: Payer: Self-pay | Admitting: Internal Medicine

## 2019-08-10 DIAGNOSIS — L732 Hidradenitis suppurativa: Secondary | ICD-10-CM

## 2019-08-11 ENCOUNTER — Other Ambulatory Visit: Payer: Self-pay | Admitting: Internal Medicine

## 2019-08-11 DIAGNOSIS — I10 Essential (primary) hypertension: Secondary | ICD-10-CM

## 2019-08-20 ENCOUNTER — Ambulatory Visit (INDEPENDENT_AMBULATORY_CARE_PROVIDER_SITE_OTHER): Payer: Medicare Other | Admitting: Licensed Clinical Social Worker

## 2019-08-20 ENCOUNTER — Ambulatory Visit: Payer: Medicare Other | Admitting: Family Medicine

## 2019-08-20 ENCOUNTER — Encounter: Payer: Medicare Other | Admitting: Student

## 2019-08-20 ENCOUNTER — Telehealth: Payer: Self-pay

## 2019-08-20 DIAGNOSIS — F410 Panic disorder [episodic paroxysmal anxiety] without agoraphobia: Secondary | ICD-10-CM

## 2019-08-20 DIAGNOSIS — F4312 Post-traumatic stress disorder, chronic: Secondary | ICD-10-CM

## 2019-08-20 DIAGNOSIS — F411 Generalized anxiety disorder: Secondary | ICD-10-CM

## 2019-08-20 DIAGNOSIS — F331 Major depressive disorder, recurrent, moderate: Secondary | ICD-10-CM | POA: Diagnosis not present

## 2019-08-20 NOTE — Telephone Encounter (Signed)
I Agree with your plan lauren.

## 2019-08-20 NOTE — Telephone Encounter (Signed)
Patient called back stating she would not be able to come to clinic today as her Aide is with another client. States she is feeling better now and h/a is gone. Offered to make appt for Mon 08/23/2019 but patient states she already has appt for next Fri 08/27/2019 and does not feel she needs to be seen sooner. Advised if she were to develop CP, worsening SHOB to call 911. She is agreeable. Hubbard Hartshorn, BSN, RN-BC

## 2019-08-20 NOTE — Telephone Encounter (Signed)
Returned call to patient. States BP 2 days ago was 207/105 when she was having biomarker test. Took 240 mg Valsartan and 5 mg Amlodipine today at 8 AM and BP one hour later was 210/120. Requested patient take BP while on phone and it was 158/90. Patient is c/o headache, rating 9/10, "head feels woozy." Reports usual amount of SHOB 2/2 COPD. Denies CP. Since BP has come down, advised patient to come to clinic today vs going to ED. She will contact her Aide for transportation and call back to schedule. Hubbard Hartshorn, BSN, RN-BC

## 2019-08-20 NOTE — Telephone Encounter (Signed)
Pt states bp this morning 207/120, please call pt back.

## 2019-08-20 NOTE — Progress Notes (Signed)
Virtual Visit via Telephone Note  Therapist-office Patient-home I connected with Lindsay [name] on 08/20/19 at  8:00 AM EDT by telephone and verified that I am speaking with the correct person using two identifiers.   I discussed the limitations, risks, security and privacy concerns of performing an evaluation and management service by telephone and the availability of in person appointments. I also discussed with the patient that there may be a patient responsible charge related to this service. The patient expressed understanding and agreed to proceed.   I discussed the assessment and treatment plan with the patient. The patient was provided an opportunity to ask questions and all were answered. The patient agreed with the plan and demonstrated an understanding of the instructions.   The patient was advised to call back or seek an in-person evaluation if the symptoms worsen or if the condition fails to improve as anticipated.  I provided 35 minutes of non-face-to-face time during this encounter.  THERAPIST PROGRESS NOTE  Session Time: 8:00 AM to 8:35 AM  Participation Level: Active  Behavioral Response: CasualAlertAnxious and Dysphoric  Type of Therapy: Individual Therapy  Treatment Goals addressed: help cope with health issues, anxiety, triggers, coping   Interventions: CBT, Solution Focused, Strength-based, Supportive and Other: coping  Summary: Lindsay [name] is a 49 y.o. female who presents with about the same. Shared her concerns that doctor from pain clinic wants her to take a biomarker test looked it up and found out it is a screening for cancer. This brings up a lot of anxiety. Blood pressure was extremely high. Told to call her doctor.  Therapist encourage patient with this as well and she will go today if she has a ride. There are things going on with her that she brought up at her appointment things can't control. They tested her breathing, has sweating, blood pressure is  extremely high.  Doctor told her that she has taken everything can for fibromyalgia. Also that he wants to try to figure out what is going on so best way to help her. Has appointment today to go to surgeon's office for hidradenitis.  Wants to cancel this appointment just because body not up for it today. Therapist encouraged her to go but patient related she has to do what is best for her and therapist agreed with this. Patient goes back to pain clinic August 5. Patient continues to struggle with her medical issues. She gets out of breath, struggles to do anything. Hoping to stop the back pain. Hard to move around. Hopes the pain clinic will give her relief. Her day is laying there in pain. Can't breath, walk awkwardly. Headache ever since Wednesday worried about the test. Feet like bare feet in snow scary and having all these things coming up at the same time. Need to ask for a different helper somebody who can help her get in shower, help her clean her room.  At the same time she goes and gets her medications takes her to appointments and will find somebody to help her with this.  Patient brought up she has been on [employer] people for entertainment.  Concerns with sharing more about herself and her medical issues.  He has gotten some positive responses when she has been honest.  Therapist brought up that if she wants to move forward with relationship she is going to have to share more about herself.  At the same time patient aware developing relationships through media can take seriously not sure who you are  talking to  Suicidal/Homicidal: No  Therapist Response: Therapist reviewed symptoms, facilitated expression of thoughts and feelings utilize CBT strategies of not aging and cognitive distortions such as catastrophizing and fortune telling that we will increase anxiety.  Therapist continues to encourage patient to focus on strategies that will address and help alleviate medical issues which is her  main stressor currently. Therapist provided patient with ongoing emotional support and encouragement.  Reinforced the importance of client staying focused on her own strengths and resources and resiliency. Processed various strategies for dealing with stressors.   Processed patient's feelings related to initiating relationships in order for her to make healthy choices how to proceed in these relationships.  Therapist provided active listening, open questions and supportive interventions  Plan: Return again in 1 week.2.Therapist continue to work with patient on treatment goal of supporting patient in managing pain issues, stress management, problem solving around stress issues.  Therapist work with patient on depression, anxiety, processing through feelings related to past  Diagnosis: Axis I:  Major depressive disorder, recurrent, moderate, generalized anxiety disorder, chronic PTSD, panic attacks    Axis II: No diagnosis    [name], LCSW 08/20/2019

## 2019-08-24 ENCOUNTER — Ambulatory Visit (INDEPENDENT_AMBULATORY_CARE_PROVIDER_SITE_OTHER): Payer: Medicare Other | Admitting: Licensed Clinical Social Worker

## 2019-08-24 ENCOUNTER — Other Ambulatory Visit: Payer: Self-pay

## 2019-08-24 DIAGNOSIS — F4312 Post-traumatic stress disorder, chronic: Secondary | ICD-10-CM

## 2019-08-24 DIAGNOSIS — F331 Major depressive disorder, recurrent, moderate: Secondary | ICD-10-CM | POA: Diagnosis not present

## 2019-08-24 DIAGNOSIS — F411 Generalized anxiety disorder: Secondary | ICD-10-CM

## 2019-08-24 DIAGNOSIS — F410 Panic disorder [episodic paroxysmal anxiety] without agoraphobia: Secondary | ICD-10-CM

## 2019-08-24 MED ORDER — PROMETHAZINE HCL 12.5 MG PO TABS: ORAL_TABLET | ORAL | 0 refills | Status: DC

## 2019-08-24 NOTE — Progress Notes (Signed)
Virtual Visit via Telephone Note  Therapist-office Patient-home I connected with Lindsay [name] on 08/24/19 at  8:00 AM EDT by telephone and verified that I am speaking with the correct person using two identifiers.   I discussed the limitations, risks, security and privacy concerns of performing an evaluation and management service by telephone and the availability of in person appointments. I also discussed with the patient that there may be a patient responsible charge related to this service. The patient expressed understanding and agreed to proceed.   I discussed the assessment and treatment plan with the patient. The patient was provided an opportunity to ask questions and all were answered. The patient agreed with the plan and demonstrated an understanding of the instructions.   The patient was advised to call back or seek an in-person evaluation if the symptoms worsen or if the condition fails to improve as anticipated.  I provided 30 minutes of non-face-to-face time during this encounter.  THERAPIST PROGRESS NOTE  Session Time: 8:20 AM to 8:50 AM  Participation Level: Active  Behavioral Response: CasualAlertAnxious and Dysphoric  Type of Therapy: Individual Therapy  Treatment Goals addressed:   help cope with health issues, anxiety, triggers, coping   Interventions: Solution Focused, Strength-based, Supportive and Other: coping  Summary: Lindsay [name] is a 49 y.o. female who presents with plan of going in to see doctor this for blood pressure if can get a ride. Also have home inspection though. Shared that just putting canned goods away in a container made her tired. Feels tired. Sister helping her do things like clean the kitchen. Patient can only stand for a period of time. They are almost finished. Shared has been frustrated with daughter recently and who she is involved with. Won't listen to her. Disruptive in that she brings him over. Family not happy and complaining to  patient.  Therapist pointed out would be better if they would tell daughter and she would get the message if other people were telling her there was a problem.  It makes things harder for patient and has other things to think about like her health. Hears everything about everything and come to patient.  Reviewed ways to manage stress and patient relates only has therapist to talk to indicating therapy is important resource patient also says continues to watch services for church Tuesday Wednesday and Sunday.  Patient shared worried about biomarker test as it is a test for cancer.  Therapist explored reasons for giving test and find out more about that to help lessen concerns for example may be it is a standard test given.  Patient was given test on July 21 and will get results on August 5..     Suicidal/Homicidal: No  Therapist Response: Therapist reviewed symptoms, facilitated expression of thoughts and feelings, utilize reframing related to patient's stressors of waiting to hear results for test relating other reasons test was given and to explore and research further better understanding why was given.  Work with patient on stress management strategies for issues at home, validated her on how she was feeling but also discussed family members going directly to the source of the problem, the family member causing the problem as a better way to handle conflict in the family, not always going to patient particularly when she has no stressors.  Discussed helpful for patient have someone to vent to particular when family comes to her with all the issues while she herself is dealing with medical issues.  Besides implementing stress management  to deal directly with stressors discussed other ways to relieve stress including church services, medications is helpful.  Therapist provided active listening, open questions, supportive interventions as well as strength based.  Continue to encourage patient to problem solve  around medical issues to help relieve symptoms. Plan: Return again in 1 weeks.2.Therapist continue to work with patient on treatment goal of supporting patient in managing pain issues, stress management, problem solving around stress issues.  Therapist work with patient on depression, anxiety, processing through feelings related to past  Diagnosis: Axis I:  Major depressive disorder, recurrent, moderate, generalized anxiety disorder, chronic PTSD, panic attacks    Axis II: No diagnosis    Lindsay Register, LCSW 08/24/2019

## 2019-08-26 ENCOUNTER — Ambulatory Visit: Payer: Medicare Other | Admitting: Physician Assistant

## 2019-08-26 ENCOUNTER — Telehealth: Payer: Self-pay | Admitting: Physician Assistant

## 2019-08-26 ENCOUNTER — Telehealth: Payer: Self-pay | Admitting: Family Medicine

## 2019-08-26 NOTE — Telephone Encounter (Signed)
Bleeding with Nexplanon  x48 days

## 2019-08-26 NOTE — Telephone Encounter (Signed)
Hey Lindsay [name], this pt is not able to make it to her appt today @9 :30am due to her having high blood pressure and needing to take a cancer test that she found out about late yesterday afternoon

## 2019-08-27 ENCOUNTER — Encounter: Payer: Medicare Other | Admitting: Internal Medicine

## 2019-08-30 NOTE — Telephone Encounter (Signed)
Call returned to pt and discussed her concerns. She stated that she has been having very heavy bleeding for >48 days. She is changing her super tampon 8-10 times/Ethyn Schetter. She does have irregular periods and in the past has had cycles which last 14-20 days but never this long. Pt stated that she quit smoking on 04/17/19 in case the doctor wants to prescribe birth control pills to control the bleeding. Per chart review, pt had expired Nexplanon removed and new one placed on 11/18/18. I advised pt that I will speak with a provider and call her back with recommended plan of care.

## 2019-08-31 ENCOUNTER — Other Ambulatory Visit: Payer: Self-pay | Admitting: Pulmonary Disease

## 2019-08-31 MED ORDER — MEGESTROL ACETATE 40 MG PO TABS
80.0000 mg | ORAL_TABLET | Freq: Two times a day (BID) | ORAL | 0 refills | Status: AC
Start: 2019-08-31 — End: 2019-09-21

## 2019-08-31 NOTE — Telephone Encounter (Signed)
Reviewed pt's c/o with Maye Hides, CNM.  Provider recommendation is to prescribe Megace 80 mg po bid for three weeks and to f/u with MD.  Notified pt provider's recommendation and that someone from the front office will call her to schedule an appt.  Pt verbalized understanding.   Mel Almond, RN 08/31/19

## 2019-09-03 ENCOUNTER — Ambulatory Visit (INDEPENDENT_AMBULATORY_CARE_PROVIDER_SITE_OTHER): Payer: Medicare Other | Admitting: Licensed Clinical Social Worker

## 2019-09-03 DIAGNOSIS — F331 Major depressive disorder, recurrent, moderate: Secondary | ICD-10-CM | POA: Diagnosis not present

## 2019-09-03 DIAGNOSIS — F4312 Post-traumatic stress disorder, chronic: Secondary | ICD-10-CM

## 2019-09-03 DIAGNOSIS — F411 Generalized anxiety disorder: Secondary | ICD-10-CM | POA: Diagnosis not present

## 2019-09-03 DIAGNOSIS — F410 Panic disorder [episodic paroxysmal anxiety] without agoraphobia: Secondary | ICD-10-CM | POA: Diagnosis not present

## 2019-09-03 NOTE — Progress Notes (Signed)
Virtual Visit via Telephone Note  Therapist-home office Patient-home I connected with Lindsay [name] on 09/03/19 at  8:00 AM EDT by telephone and verified that I am speaking with the correct person using two identifiers.   I discussed the limitations, risks, security and privacy concerns of performing an evaluation and management service by telephone and the availability of in person appointments. I also discussed with the patient that there may be a patient responsible charge related to this service. The patient expressed understanding and agreed to proceed.   I discussed the assessment and treatment plan with the patient. The patient was provided an opportunity to ask questions and all were answered. The patient agreed with the plan and demonstrated an understanding of the instructions.   The patient was advised to call back or seek an in-person evaluation if the symptoms worsen or if the condition fails to improve as anticipated.  I provided 20 minutes of non-face-to-face time during this encounter.  THERAPIST PROGRESS NOTE  Session Time: 8:00 AM to 8:20 AM  Participation Level: Active  Behavioral Response: CasualAlertAngry and Depressed  Type of Therapy: Individual Therapy  Treatment Goals addressed:  help cope with health issues, anxiety, triggers, coping  Interventions: Solution Focused, Strength-based, Supportive and Other: coping  Summary: Lindsay [name] is a 49 y.o. female who presents with I am in a lot of pain. [facility] to [facility]. Doesn't have cancer. They are telling her that she is not in pain nothing can do for her and no reason for her to come. Internal records weren't sent over. Patient describes feeling mad and sad doesn't know what is wrong so can't do anything for her. Nobody knows what is wrong so nobody can help her. He was telling her it was mental and not mental so just got up and left. Going to take Lamictal and go back to sleep. Mentally drained.   Therapist validated patient on how she was feeling dealing with frustration and pain and no one able to help her, pain that is real to patient.  Started to engage in problem solving suggesting there would be somebody out there that get help her she just had a find the right person.  Therapist also shared that she is on the journey with patient even though she cannot help her with medical issues to be a support as she is going through this.  Best option for patient at this point is to get rest given how she is feeling so session was a short 1.  Therapist provided active listening open questions strength based and supportive interventions.  Patient did share she was given something to stop the bleeding she had been bleeding for 58 days and therapist pointed out with glad to share some positive news and to keep that in mind amidst difficulties patient is experiencing.  Suicidal/Homicidal: No  Plan: Return again in 1 week. 2.Therapist continue to work with patient on treatment goal of supporting patient in managing pain issues, stress management, problem solving around stress issues.  Therapist work with patient on depression, anxiety, processing through feelings related to past  Diagnosis: Axis I: Major depressive disorder, recurrent, moderate, generalized anxiety disorder, chronic PTSD, panic attacks    Axis II: No diagnosis    [name], LCSW 09/03/2019

## 2019-09-13 ENCOUNTER — Other Ambulatory Visit: Payer: Self-pay | Admitting: Internal Medicine

## 2019-09-13 DIAGNOSIS — L732 Hidradenitis suppurativa: Secondary | ICD-10-CM

## 2019-09-15 ENCOUNTER — Telehealth (INDEPENDENT_AMBULATORY_CARE_PROVIDER_SITE_OTHER): Payer: Medicare Other | Admitting: Psychiatry

## 2019-09-15 ENCOUNTER — Encounter (HOSPITAL_COMMUNITY): Payer: Self-pay | Admitting: Psychiatry

## 2019-09-15 ENCOUNTER — Other Ambulatory Visit: Payer: Self-pay

## 2019-09-15 DIAGNOSIS — F4312 Post-traumatic stress disorder, chronic: Secondary | ICD-10-CM

## 2019-09-15 DIAGNOSIS — F411 Generalized anxiety disorder: Secondary | ICD-10-CM

## 2019-09-15 DIAGNOSIS — F331 Major depressive disorder, recurrent, moderate: Secondary | ICD-10-CM | POA: Diagnosis not present

## 2019-09-15 DIAGNOSIS — F410 Panic disorder [episodic paroxysmal anxiety] without agoraphobia: Secondary | ICD-10-CM | POA: Diagnosis not present

## 2019-09-15 MED ORDER — LAMOTRIGINE 100 MG PO TABS: ORAL_TABLET | ORAL | 2 refills | Status: DC

## 2019-09-15 MED ORDER — LAMOTRIGINE 150 MG PO TABS: ORAL_TABLET | ORAL | 0 refills | Status: DC

## 2019-09-15 MED ORDER — HYDROXYZINE PAMOATE 50 MG PO CAPS: 50.0000 mg | ORAL_CAPSULE | Freq: Every evening | ORAL | 2 refills | Status: DC | PRN

## 2019-09-15 MED ORDER — CLORAZEPATE DIPOTASSIUM 3.75 MG PO TABS: 3.7500 mg | ORAL_TABLET | Freq: Every day | ORAL | 2 refills | Status: DC

## 2019-09-15 MED ORDER — REXULTI 3 MG PO TABS: 3.0000 mg | ORAL_TABLET | Freq: Every day | ORAL | 2 refills | Status: DC
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
## 2019-09-20 ENCOUNTER — Encounter (INDEPENDENT_AMBULATORY_CARE_PROVIDER_SITE_OTHER): Payer: Self-pay

## 2019-09-21 ENCOUNTER — Other Ambulatory Visit: Payer: Self-pay | Admitting: Gastroenterology

## 2019-09-21 ENCOUNTER — Telehealth: Payer: Self-pay | Admitting: Gastroenterology

## 2019-09-21 NOTE — Telephone Encounter (Signed)
Patient is calling, wants to make appointment for medication refill, but there is no available appointments right now. She needs early morning appointment due to transportation. Dr. Ardis Hughs & Pas are completely booked out. Requesting refill for phenergan, will call back Monday to see if November calendar is out.

## 2019-09-22 ENCOUNTER — Other Ambulatory Visit: Payer: Self-pay | Admitting: Primary Care

## 2019-09-22 MED ORDER — PROMETHAZINE HCL 12.5 MG PO TABS: ORAL_TABLET | ORAL | 0 refills | Status: DC

## 2019-09-22 NOTE — Telephone Encounter (Signed)
Rx sent to pharmacy as requested.  Milus Banister, MD  Stevan Born, CMA Yes, that is OK to refill. Thanks for checking.   Dj    ----- Message -----  From: Stevan Born, CMA  Sent: 09/22/2019  1:24 PM EDT  To: Milus Banister, MD  Subject: med refills                    Patient is asking for refills on phenegran. She has no showed last appointment, and cancelled 2 appts prior. We do not have any openings with you or an APP until Nov when the new schedule comes out. Is it OK to refill?   Thank you

## 2019-09-23 ENCOUNTER — Telehealth: Payer: Self-pay | Admitting: Internal Medicine

## 2019-09-23 ENCOUNTER — Other Ambulatory Visit: Payer: Self-pay | Admitting: Pulmonary Disease

## 2019-09-23 ENCOUNTER — Other Ambulatory Visit: Payer: Self-pay | Admitting: Internal Medicine

## 2019-09-23 DIAGNOSIS — R059 Cough, unspecified: Secondary | ICD-10-CM

## 2019-09-23 DIAGNOSIS — L732 Hidradenitis suppurativa: Secondary | ICD-10-CM

## 2019-09-23 MED ORDER — FAMOTIDINE 20 MG PO TABS: 20.0000 mg | ORAL_TABLET | Freq: Two times a day (BID) | ORAL | 0 refills | Status: DC

## 2019-09-23 MED ORDER — MELOXICAM 7.5 MG PO TABS: 7.5000 mg | ORAL_TABLET | Freq: Every day | ORAL | 0 refills | Status: DC

## 2019-09-23 NOTE — Telephone Encounter (Signed)
I will approve a short course of meloxicam to help her with this acute on chronic pain due to fibromyalgia. I recommend against combining this NSAID with others in the class like Naproxen. I also recommend against long term daily use or high dose NSAIDs as she is also on daily valsartan which puts her at risk for AKI.

## 2019-09-23 NOTE — Telephone Encounter (Signed)
Patient notified of info below and is very appreciative. Hubbard Hartshorn, BSN, RN-BC

## 2019-09-23 NOTE — Telephone Encounter (Signed)
Returned call to patient. States she saw Pain Management doctor on 09/02/2019 and he told her there was nothing he could do for her. States she did not have the bio marker for cancer and "he thinks the pain is all in my head." States she has not left her house since then as she can barely move from the generalized pain she is in. States she has another appt with him on 09/30/2019 but doesn't think she should go if he cannot help her. She is requesting a refill on meloxicam and naproxen. Naproxen is no longer on current med list. Please advise. Hubbard Hartshorn, BSN, RN-BC

## 2019-09-23 NOTE — Telephone Encounter (Signed)
Pt is requesting a callback [phone] pain medicine

## 2019-09-24 ENCOUNTER — Ambulatory Visit (INDEPENDENT_AMBULATORY_CARE_PROVIDER_SITE_OTHER): Payer: Medicare Other | Admitting: Licensed Clinical Social Worker

## 2019-09-24 DIAGNOSIS — F331 Major depressive disorder, recurrent, moderate: Secondary | ICD-10-CM | POA: Diagnosis not present

## 2019-09-24 DIAGNOSIS — F4312 Post-traumatic stress disorder, chronic: Secondary | ICD-10-CM

## 2019-09-24 DIAGNOSIS — F411 Generalized anxiety disorder: Secondary | ICD-10-CM

## 2019-09-24 DIAGNOSIS — F410 Panic disorder [episodic paroxysmal anxiety] without agoraphobia: Secondary | ICD-10-CM
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
## 2019-09-28 ENCOUNTER — Ambulatory Visit: Payer: Medicare Other | Admitting: Obstetrics and Gynecology

## 2019-10-01 ENCOUNTER — Ambulatory Visit (INDEPENDENT_AMBULATORY_CARE_PROVIDER_SITE_OTHER): Payer: Medicare Other | Admitting: Licensed Clinical Social Worker

## 2019-10-01 DIAGNOSIS — F4312 Post-traumatic stress disorder, chronic: Secondary | ICD-10-CM

## 2019-10-01 DIAGNOSIS — F411 Generalized anxiety disorder: Secondary | ICD-10-CM

## 2019-10-01 DIAGNOSIS — F331 Major depressive disorder, recurrent, moderate: Secondary | ICD-10-CM

## 2019-10-01 DIAGNOSIS — F410 Panic disorder [episodic paroxysmal anxiety] without agoraphobia: Secondary | ICD-10-CM

## 2019-10-01 NOTE — Progress Notes (Signed)
Virtual Visit via Telephone Note  Therapist-home office Patient-home I connected with [name] on 10/01/19 at  8:00 AM EDT by telephone and verified that I am speaking with the correct person using two identifiers.   I discussed the limitations, risks, security and privacy concerns of performing an evaluation and management service by telephone and the availability of in person appointments. I also discussed with the patient that there may be a patient responsible charge related to this service. The patient expressed understanding and agreed to proceed.   I discussed the assessment and treatment plan with the patient. The patient was provided an opportunity to ask questions and all were answered. The patient agreed with the plan and demonstrated an understanding of the instructions.   The patient was advised to call back or seek an in-person evaluation if the symptoms worsen or if the condition fails to improve as anticipated.  I provided 10 minutes of non-face-to-face time during this encounter.  THERAPIST PROGRESS NOTE  Session Time: 8:00 AM to 8:10 AM  Participation Level: Active  Behavioral Response: CasualAlertAnxious, Dysphoric and Euthymic  Type of Therapy: Individual Therapy  Treatment Goals addressed: help cope with health issues, anxiety, triggers, coping   Interventions: Solution Focused, Strength-based, Supportive and Other: coping  Summary: Lindsay [name] is a 49 y.o. female who presents with gong to the dentist. Discussed this is positive as she is getting to treatment providers as one of the main issues for her right now is getting treatment for her medical issues. She also left a message for her PCP and although on vacation there are other doctors to handle it in the office. She wants to know what is next? Discussed getting to pain clinic as another important issue. Shares her leg is hurting. Session short as she needed to get to ride.  Therapist provided active  listening open questions supportive interventions  Suicidal/Homicidal: No  Plan: Return again in 2 weeks.2.Therapist continue to work with patient on treatment goal of supporting patient in managing pain issues, stress management, problem solving around stress issues.  Therapist work with patient on depression, anxiety, processing through feelings related to past  Diagnosis: Axis I:   Major depressive disorder, recurrent, moderate, generalized anxiety disorder, chronic PTSD, panic attacks    Axis II: No diagnosis    [name], LCSW 10/01/2019

## 2019-10-07 DIAGNOSIS — J9620 Acute and chronic respiratory failure, unspecified whether with hypoxia or hypercapnia: Secondary | ICD-10-CM | POA: Diagnosis not present

## 2019-10-08 ENCOUNTER — Telehealth: Payer: Self-pay | Admitting: Internal Medicine

## 2019-10-08 DIAGNOSIS — M797 Fibromyalgia: Secondary | ICD-10-CM

## 2019-10-08 NOTE — Telephone Encounter (Signed)
I think continued attempt to get her into a pain clinic is appropriate.  She has fibromyalgia, not appropriate for opiate therapy from our clinic and based on review, has multiple challenges to pain control.   Referral placed as requested by patient.   Gilles Chiquito, MD

## 2019-10-08 NOTE — Telephone Encounter (Signed)
Pt contact [phone]

## 2019-10-08 NOTE — Telephone Encounter (Signed)
RTC to patient.  She states we referred her to a  pain doctor, Legrand Como Roche at [facility] which she has seen a couple of times.  Patient states he told her at her last appt he will not treat her because he doesn't "have any evidence she is in pain".  She is requesting another referral for pain management.  She uses [facility] transportation and states it has to be a physician in [place].  She is requesting to see Mohammed Kindle at [facility]. Will forward to attending pool. Thank you, SChaplin, RN,BSN

## 2019-10-10 ENCOUNTER — Other Ambulatory Visit: Payer: Self-pay | Admitting: Internal Medicine

## 2019-10-10 DIAGNOSIS — G4733 Obstructive sleep apnea (adult) (pediatric): Secondary | ICD-10-CM | POA: Diagnosis not present

## 2019-10-10 DIAGNOSIS — L732 Hidradenitis suppurativa: Secondary | ICD-10-CM

## 2019-10-11 ENCOUNTER — Ambulatory Visit (INDEPENDENT_AMBULATORY_CARE_PROVIDER_SITE_OTHER): Payer: Medicare Other | Admitting: Licensed Clinical Social Worker

## 2019-10-11 DIAGNOSIS — F4312 Post-traumatic stress disorder, chronic: Secondary | ICD-10-CM

## 2019-10-11 DIAGNOSIS — F411 Generalized anxiety disorder: Secondary | ICD-10-CM | POA: Diagnosis not present

## 2019-10-11 DIAGNOSIS — F331 Major depressive disorder, recurrent, moderate: Secondary | ICD-10-CM

## 2019-10-11 DIAGNOSIS — F410 Panic disorder [episodic paroxysmal anxiety] without agoraphobia: Secondary | ICD-10-CM
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
## 2019-10-14 ENCOUNTER — Other Ambulatory Visit: Payer: Self-pay | Admitting: Pulmonary Disease

## 2019-10-21 ENCOUNTER — Other Ambulatory Visit: Payer: Self-pay | Admitting: Pulmonary Disease

## 2019-10-22 ENCOUNTER — Ambulatory Visit: Payer: Self-pay | Admitting: Family Medicine

## 2019-10-26 ENCOUNTER — Ambulatory Visit (INDEPENDENT_AMBULATORY_CARE_PROVIDER_SITE_OTHER): Payer: Medicare Other | Admitting: Internal Medicine

## 2019-10-26 ENCOUNTER — Telehealth: Payer: Self-pay | Admitting: Obstetrics and Gynecology

## 2019-10-26 ENCOUNTER — Other Ambulatory Visit: Payer: Self-pay

## 2019-10-26 ENCOUNTER — Encounter: Payer: Self-pay | Admitting: Internal Medicine

## 2019-10-26 ENCOUNTER — Telehealth: Payer: Self-pay

## 2019-10-26 DIAGNOSIS — J9620 Acute and chronic respiratory failure, unspecified whether with hypoxia or hypercapnia: Secondary | ICD-10-CM | POA: Diagnosis not present

## 2019-10-26 DIAGNOSIS — M797 Fibromyalgia: Secondary | ICD-10-CM | POA: Diagnosis not present

## 2019-10-26 MED ORDER — MEGESTROL ACETATE 40 MG PO TABS: 80.0000 mg | ORAL_TABLET | Freq: Two times a day (BID) | ORAL | 0 refills | Status: DC

## 2019-10-26 MED ORDER — IBUPROFEN 800 MG PO TABS: 800.0000 mg | ORAL_TABLET | Freq: Three times a day (TID) | ORAL | 0 refills | Status: DC | PRN

## 2019-10-26 MED ORDER — QUTENZA (4 PATCH) 8 % EX KIT: 1.0000 | PACK | Freq: Three times a day (TID) | CUTANEOUS | 0 refills | Status: DC

## 2019-10-26 NOTE — Progress Notes (Signed)
  [facility] Internal Medicine Residency Telephone Encounter Continuity Care Appointment  HPI:   This telephone encounter was created for [name] on 10/26/2019 for the following purpose/cc Pain management.   Past Medical History:  Past Medical History:  Diagnosis Date  . Acid reflux   . Anemia    Iron Def  . Anorexia   . Chronic kidney disease    Nephrotic syndrome  . Colon polyp 2009  . Depression with anxiety   . Edema leg   . Fibromyalgia   . Hemorrhoids   . Hidradenitis suppurativa   . Hypertension   . IBS (irritable bowel syndrome)   . Low back pain   . Migraines   . Morbidly obese ([place])   . Neuromuscular disorder (HCC)    fibromyalgia  . Neuropathy   . Panic attacks   . Polyp of vocal cord or larynx   . Tonsil pain       ROS:  Review of Systems  Constitutional: Negative for chills, fever, malaise/fatigue and weight loss.  Eyes: Negative for blurred vision.  Cardiovascular: Negative for chest pain, palpitations, orthopnea and claudication.  Gastrointestinal: Positive for nausea. Negative for abdominal pain, constipation, diarrhea and vomiting.  Musculoskeletal: Positive for back pain, joint pain, myalgias and neck pain.  Neurological: Negative for tingling, tremors and headaches.     Assessment / Plan / Recommendations:   Please see A&P under problem oriented charting for assessment of the patient's acute and chronic medical conditions.   As always, pt is advised that if symptoms worsen or new symptoms arise, they should go to an urgent care facility or to to ER for further evaluation.   Consent and Medical Decision Making:   Patient discussed with Dr. Jimmye Norman  This is a telephone encounter between Lindsay [name] and Lindsay [name] on 10/26/2019 for Fibromyalgia pain management. The visit was conducted with the patient located at home and Lindsay [name] at [facility]. The patient's identity was confirmed using their DOB and current address. The patient  has consented to being evaluated through a telephone encounter and understands the associated risks (an examination cannot be done and the patient may need to come in for an appointment) / benefits (allows the patient to remain at home, decreasing exposure to coronavirus). I personally spent 11 minutes on medical discussion.

## 2019-10-26 NOTE — Telephone Encounter (Signed)
Please schedule patient for tele appointment

## 2019-10-26 NOTE — Assessment & Plan Note (Addendum)
Patient presents for telehealth visit today for pain management of her fibromyalgia. She is currently awaiting a response to be referred to pain clinic.   She states today that her back, shoulder, Left leg have been hurting "Forever" but could not quantify how many days. She denies fevers, cough, vision changes, chest pain, abdominal pain, constipation/diarrhea. She does have nausea which is chronic in nature and is well maintained on Phenergan.   We discussed possible regimen's for her. She stated that Ultram works well for her, but we discussed that it would be an inappropriate medication for her pain. We discussed refills on her Ibuprofen 800 mg and capsaicin patches. Additionally, I spoke with Dr. Jimmye Norman about therapy modalities, and recommended integrative therapies. Patient was provided with number and address to make an initial appointment.  - Ibuprofen 800 mg 8 hours as needed - Capsaicin 8% gel.  - Provided [facility] number and address.

## 2019-10-26 NOTE — Telephone Encounter (Signed)
Received TC from patient c/o of cont'd fibromyalgia pain and is requesting RX's for either Ibuprofen or naproxen and Capsaicin patches.  A referral to pain management was sent on 10/08/19 (please see telephone note R/T this), she has not heard back regarding appt and cannot reach anyone at the pain clinic.  Will also forward to [place] to see if she can assist patient. Thank you, SChaplin, RN,BSN

## 2019-10-26 NOTE — Telephone Encounter (Signed)
Patient called and notified she will need telehealth appt and appt was made for today at 3:45 , Dr. Gilford Rile schedule for pain management- Telehealth. SChaplin, RN,BSN

## 2019-10-26 NOTE — Telephone Encounter (Signed)
GYN Note Patient called on call RN and stated she has been bleeding for past month and she no showed to her appt on 8/31 and cancelled her appt on 9/24. She had megace before and the nurse said it worked, so I sent in [place] 80 bid and I told her to call the clinic for an earlier appt than the one she has on 11/10. ED precautions given. I also told her to call the office during business hours for any chronic issues in the future.   Durene Romans MD Attending Center for [employer] (Faculty Practice) 10/26/2019 Time: [phone]

## 2019-10-27 ENCOUNTER — Telehealth: Payer: Self-pay

## 2019-10-27 MED ORDER — CAPSAICIN 0.025 % EX PADS: 1.0000 | MEDICATED_PAD | Freq: Three times a day (TID) | CUTANEOUS | 3 refills | Status: DC | PRN

## 2019-10-27 NOTE — Telephone Encounter (Signed)
Received TC from patient, states Capsaicin patch which was called in yesterday will be $4000, requesting another type of patch.    [facility] to [facility] at [facility] to inquire if RX needs PA, pharmacist states he never received the RX for the Capsaicin patch, only for ibuprofen and he doesn't know how the patient knows it costs $4000, but this is correc costs for this RX.  Pharmacist states insurance will not pay for this anyway because Capsaicin patches are available OTC for about $10 for 4 patches.  Will forward to MD to advise if he wants to change to a different patch or advise pt to try OTC Capsaicin. Thank you, SChaplin, RN,BSN

## 2019-10-29 ENCOUNTER — Ambulatory Visit (INDEPENDENT_AMBULATORY_CARE_PROVIDER_SITE_OTHER): Payer: Medicare Other | Admitting: Licensed Clinical Social Worker

## 2019-10-29 DIAGNOSIS — F4312 Post-traumatic stress disorder, chronic: Secondary | ICD-10-CM | POA: Diagnosis not present

## 2019-10-29 DIAGNOSIS — F331 Major depressive disorder, recurrent, moderate: Secondary | ICD-10-CM

## 2019-10-29 DIAGNOSIS — F411 Generalized anxiety disorder: Secondary | ICD-10-CM | POA: Diagnosis not present

## 2019-10-29 DIAGNOSIS — F410 Panic disorder [episodic paroxysmal anxiety] without agoraphobia: Secondary | ICD-10-CM

## 2019-10-29 NOTE — Telephone Encounter (Signed)
Pt is requesting a call back  [phone]

## 2019-10-29 NOTE — Progress Notes (Signed)
Virtual Visit via Telephone Note  Therapist-home office Patient-home I connected with [name] on 10/29/19 at  8:00 AM EDT by telephone and verified that I am speaking with the correct person using two identifiers.   I discussed the limitations, risks, security and privacy concerns of performing an evaluation and management service by telephone and the availability of in person appointments. I also discussed with the patient that there may be a patient responsible charge related to this service. The patient expressed understanding and agreed to proceed.   I discussed the assessment and treatment plan with the patient. The patient was provided an opportunity to ask questions and all were answered. The patient agreed with the plan and demonstrated an understanding of the instructions.   The patient was advised to call back or seek an in-person evaluation if the symptoms worsen or if the condition fails to improve as anticipated.  I provided 25 minutes of non-face-to-face time during this encounter.  THERAPIST PROGRESS NOTE  Session Time: 8:00 AM to 8:25 AM  Participation Level: Active  Behavioral Response: CasualAlertDysphoric and patient dealing with pain issues so not feeling well  Type of Therapy: Individual Therapy  Treatment Goals addressed:  help cope with health issues, anxiety, triggers, coping  Interventions: Solution Focused, Strength-based, Supportive and Other: coping  Summary: Lindsay [name] is a 49 y.o. female who presents with tooth ache, gum swollen, need get teeth pulled. Trying to get out of cycle of pain. Hurts her on top row of teeth in the back. Making her ear hurt. Went to the dentist on September 3 and was rude so walked out. Has to go to oral surgery and has to see how much she has to pay to get teeth out. Therapist pointed out that at this point relieving the pain is a priority even if a co-pay and patient agrees. Has mouth pain going and keep sneezing for past  three days. Describes what she believes to be extra drainage stuffing up her noise. She has irregular menstrual cycle in July and August for 52 days and currently has been bleeding for 29 days. Referred to pain management on September 10. Having heard from internal medicine and pain specialist since then. Gave pain patches insurance won't pay for them wouldn't give her the Ultram. Cut in half and put on each leg, full one on back, cut and put on each shoulder. Helping her to move around able to bend legs and stand up, not so much cutting down the pain. Discussed how she is coping and patient said she  doesn't. Would like a prescription for pain patches but cost over $4000.00. Can't chew anything. Mom is making microwave meals. Sleep during the day. Lamictal helping with sleeping. Reviewed helping to get her mind off of things. Doesn't know when service or Bible study is but tapes it so sometimes watches it. Therapist pointed out seemed to be struggling during session noticing her breathing and patient . said extra drainage was causing it.  Therapist pointed out in addressing any problem that has to tackle it slowly usually 1 thing at a time and with this in mind eventually things will get addressed medically.  Therapist continues to provide supportive interventions.  Therapist provided active listening open questions strength based interventions  Suicidal/Homicidal: No  Plan: Return again in 4 weeks.2.Therapist continue to work with patient on treatment goal of supporting patient in managing pain issues, stress management, problem solving around stress issues.  Therapist work with patient on depression, anxiety,  processing through feelings related to past  Diagnosis: Axis I: Major depressive disorder, recurrent, moderate, generalized anxiety disorder, chronic PTSD, panic attacks    Axis II: No diagnosis    [name], LCSW 10/29/2019

## 2019-11-01 ENCOUNTER — Other Ambulatory Visit: Payer: Self-pay

## 2019-11-01 ENCOUNTER — Ambulatory Visit (INDEPENDENT_AMBULATORY_CARE_PROVIDER_SITE_OTHER): Payer: Medicare Other | Admitting: Internal Medicine

## 2019-11-01 ENCOUNTER — Encounter: Payer: Self-pay | Admitting: Internal Medicine

## 2019-11-01 ENCOUNTER — Telehealth: Payer: Self-pay | Admitting: *Deleted

## 2019-11-01 DIAGNOSIS — M797 Fibromyalgia: Secondary | ICD-10-CM

## 2019-11-01 MED ORDER — NAPROXEN 500 MG PO TABS: 500.0000 mg | ORAL_TABLET | Freq: Two times a day (BID) | ORAL | 2 refills | Status: DC

## 2019-11-01 NOTE — Progress Notes (Signed)
  [facility] Internal Medicine Residency Telephone Encounter Continuity Care Appointment  HPI:   This telephone encounter was created for [name] on 11/01/2019 for the following purpose/cc chronic pain.   Past Medical History:  Past Medical History:  Diagnosis Date  . Acid reflux   . Anemia    Iron Def  . Anorexia   . Chronic kidney disease    Nephrotic syndrome  . Colon polyp 2009  . Depression with anxiety   . Edema leg   . Fibromyalgia   . Hemorrhoids   . Hidradenitis suppurativa   . Hypertension   . IBS (irritable bowel syndrome)   . Low back pain   . Migraines   . Morbidly obese ([place])   . Neuromuscular disorder (HCC)    fibromyalgia  . Neuropathy   . Panic attacks   . Polyp of vocal cord or larynx   . Tonsil pain       ROS:   No recent falls or trauma, no change in nature of pain.    Assessment / Plan / Recommendations:   Please see A&P under problem oriented charting for assessment of the patient's acute and chronic medical conditions.   As always, pt is advised that if symptoms worsen or new symptoms arise, they should go to an urgent care facility or to to ER for further evaluation.   Consent and Medical Decision Making:   Patient discussed with Dr. Daryll Drown  This is a telephone encounter between Lindsay [name] and Delice Bison on 11/01/2019 for chronic pain. The visit was conducted with the patient located at home and Delice Bison at [facility]. The patient's identity was confirmed using their DOB and current address. The patient has consented to being evaluated through a telephone encounter and understands the associated risks (an examination cannot be done and the patient may need to come in for an appointment) / benefits (allows the patient to remain at home, decreasing exposure to coronavirus). I personally spent 20 minutes on medical discussion.

## 2019-11-01 NOTE — Telephone Encounter (Signed)
She will need a telehealth or in person visit for increased or change in pain meds since her PCP is on leave.  Thank you.  Gilles Chiquito, MD

## 2019-11-01 NOTE — Telephone Encounter (Signed)
Patient called in stating Dr Mohammed Kindle is not accepting her as a patient and [facility] does not take [facility]. Requesting referral to someplace else. Advised patient to contact [facility] to see who can take her insurance. Patient also requesting short course of Tylenol #3 to help her with the pain. States no relief with ibuprofen, Aleve, pain patches. Hubbard Hartshorn, BSN, RN-BC

## 2019-11-01 NOTE — Telephone Encounter (Signed)
Patient notified. Tele visit scheduled today at 3:15 with [facility] Team. Hubbard Hartshorn, BSN, RN-BC

## 2019-11-02 ENCOUNTER — Other Ambulatory Visit: Payer: Self-pay | Admitting: Internal Medicine

## 2019-11-02 ENCOUNTER — Other Ambulatory Visit: Payer: Self-pay

## 2019-11-02 ENCOUNTER — Encounter: Payer: Self-pay | Admitting: Internal Medicine

## 2019-11-02 ENCOUNTER — Other Ambulatory Visit: Payer: Self-pay | Admitting: Student in an Organized Health Care Education/Training Program

## 2019-11-02 MED ORDER — LIDOCAINE 5 % EX OINT
1.0000 | TOPICAL_OINTMENT | Freq: Two times a day (BID) | CUTANEOUS | 0 refills | Status: DC | PRN
Start: 2019-11-02 — End: 2019-11-03

## 2019-11-02 NOTE — Assessment & Plan Note (Signed)
Ms. Cheetham called for a telehealth visit regarding continued issues with her chronic pain. Spoke with Dr. Gilford Rile last week who referred her to an integrative therapy practice, but unfortunately they did not accept her insurance. I assured her our office is still working on pain management referral. She requests a medication like Tylenol #3 to help with her pain, but I explained narcotic medications are not appropriate for her type of pain.  She does not have any interest in trying Cymbalta or Lyrica again. She will continue taking the Gabapentin her PCP initiated. I also encouraged her to continue the topical analgesics that were prescribed last telehealth visit and that I will send in a different NSAID for her to take as well.

## 2019-11-03 MED ORDER — FAMOTIDINE 20 MG PO TABS: 20.0000 mg | ORAL_TABLET | Freq: Two times a day (BID) | ORAL | 0 refills | Status: DC

## 2019-11-03 MED ORDER — DOXYCYCLINE HYCLATE 100 MG PO TABS
100.0000 mg | ORAL_TABLET | Freq: Two times a day (BID) | ORAL | 0 refills | Status: DC
Start: 2019-11-03 — End: 2019-11-25

## 2019-11-03 MED ORDER — LIDOCAINE 5 % EX OINT
1.0000 | TOPICAL_OINTMENT | Freq: Two times a day (BID) | CUTANEOUS | 0 refills | Status: DC | PRN
Start: 2019-11-03 — End: 2020-02-18

## 2019-11-04 NOTE — Progress Notes (Signed)
Internal Medicine Clinic Attending  Case discussed with Dr. Bloomfield  At the time of the visit.  We reviewed the resident's history and pertinent patient test results.  I agree with the assessment, diagnosis, and plan of care documented in the resident's note.  

## 2019-11-06 DIAGNOSIS — J9620 Acute and chronic respiratory failure, unspecified whether with hypoxia or hypercapnia: Secondary | ICD-10-CM | POA: Diagnosis not present

## 2019-11-08 ENCOUNTER — Telehealth: Payer: Self-pay | Admitting: Pulmonary Disease

## 2019-11-08 ENCOUNTER — Other Ambulatory Visit: Payer: Self-pay | Admitting: Pulmonary Disease

## 2019-11-08 MED ORDER — DOXYCYCLINE HYCLATE 100 MG PO TABS: 100.0000 mg | ORAL_TABLET | Freq: Two times a day (BID) | ORAL | 0 refills | Status: DC

## 2019-11-08 MED ORDER — PREDNISONE 20 MG PO TABS: 20.0000 mg | ORAL_TABLET | Freq: Every day | ORAL | 0 refills | Status: DC

## 2019-11-08 NOTE — Telephone Encounter (Signed)
I see that the patient was already started on doxycycline on the 6th   Can have prednisone 20 p.o. daily for 7 days  Offer follow-up with one of the apps if symptoms still not controlled

## 2019-11-08 NOTE — Telephone Encounter (Signed)
Call in a course of antibiotics-doxycycline 100 p.o. twice daily for 7 days and prednisone 20 mg p.o. daily for 7 days  If symptoms not improving then offer a visit with one of the APP's

## 2019-11-08 NOTE — Telephone Encounter (Signed)
Spoke to patient and relayed below message.  Spoke to [place] with [facility] and canceled Rx for Doxy.  Nothing further needed.

## 2019-11-08 NOTE — Telephone Encounter (Signed)
Spoke with pt. She is aware of Dr. Judson Roch response. Rxs have been sent in. Nothing further was needed.

## 2019-11-08 NOTE — Telephone Encounter (Signed)
Primary Pulmonologist: [place] Last office visit and with whom: 06/07/19 with Beth What do we see them for (pulmonary problems): COPD/OSA Last OV assessment/plan: Assessment and Plan:  Obstructive Bronchitis: - Acute exacerbation - Rx doxycycline 1 tab twice daily x 7 days; additional prednisone 20mg  x 5 days - Continue Symbicort 160 two puffs twice daily; albuterol nebulizer q 4-6 hours prn shortness of breath or wheezing - Consider adding LAMA if continues to have exacerbations  - Received both covid vaccines   Allergic rhinitis: - Resume Zyrtec 10mg  daily and flonase nasal spray   Panic disorder: - Continue to follow with behavioral health   Follow Up Instructions:  - Return if symptoms do not improve or worsen in 7-10 days  I discussed the assessment and treatment plan with the patient. The patient was provided an opportunity to ask questions and all were answered. The patient agreed with the plan and demonstrated an understanding of the instructions.  The patient was advised to call back or seek an in-person evaluation if the symptoms worsen or if the condition fails to improve as anticipated.  Was appointment offered to patient (explain)?  Pt wants something sent to pharmacy   Reason for call: Called and spoke with pt who stated she has had worsening SOB x1 week. Pt has had to use rescue inhaler at least 5 times daily which she does not think is working for her. Pt has also had to do a neb treatment every 4 hours which she also cannot tell if it is working.  Pt has had some wheezing. Pt denies any complaints of cough or chest tightness.  Pt has been wearing her O2 doing 4-5L O2.  Pt denies any complaints of fever as last temp was 98.7.  Pt is taking all meds as prescribed. Pt is using her symbicort inhaler twice daily as prescribed.  Pt wants to know what we recommend to help with her symptoms. Dr. Jenetta Downer, please advise.   Allergies  Allergen Reactions  .  Lisinopril Rash and Cough    Immunization History  Administered Date(s) Administered  . Influenza Split 11/27/2010  . Influenza Whole 11/23/2008  . Influenza,inj,Quad PF,6+ Mos 10/20/2014, 10/09/2015, 11/04/2016, 11/09/2017, 11/18/2018  . PFIZER SARS-COV-2 Vaccination 04/17/2019, 05/08/2019  . Pneumococcal Polysaccharide-23 08/12/2018

## 2019-11-09 DIAGNOSIS — G4733 Obstructive sleep apnea (adult) (pediatric): Secondary | ICD-10-CM | POA: Diagnosis not present

## 2019-11-10 ENCOUNTER — Other Ambulatory Visit: Payer: Self-pay | Admitting: Student in an Organized Health Care Education/Training Program

## 2019-11-10 DIAGNOSIS — L732 Hidradenitis suppurativa: Secondary | ICD-10-CM

## 2019-11-15 ENCOUNTER — Telehealth: Payer: Self-pay

## 2019-11-15 NOTE — Telephone Encounter (Signed)
Please call pt back about having pain, please call pt back.

## 2019-11-15 NOTE — Telephone Encounter (Signed)
Returned call to patient. C/o knee, back, and shoulder pain. She feels this is osteoarthritis pain in addition to the fibromyalgia pain. Has not been able to reach [facility] to schedule appt. States they do not answer the phone and do not call her back. States she spoke with insurance and was told they would pay for capsaicin 8% patch if a PA was done. Will forward to referral and PA staff. Hubbard Hartshorn, BSN, RN-BC

## 2019-11-16 NOTE — Telephone Encounter (Signed)
Sounds good. It looks like the capsaisin patch was prescribed on 9/28. Let us know how we can help with the PA.

## 2019-11-17 ENCOUNTER — Telehealth: Payer: Self-pay | Admitting: *Deleted

## 2019-11-17 NOTE — Telephone Encounter (Addendum)
Information was sent to [facility] for PA for Capsaicin GCleansing Gel (Qutenza 4 patch) .  Awaiting determination.  Sander Nephew, RN 11/17/2019 2:57 PM. Additional information was faxed to [facility] for PA request.  Sander Nephew, RN 11/18/2019 11:16 AM.

## 2019-11-17 NOTE — Telephone Encounter (Signed)
[place], Chilon F  Powers, Cooper Render, RN; Silvano Rusk Orvis Brill, RN  Called and spoke with [place] @ the [facility]. Their Coordinator trevor is on Vacation and should be back next week. They are unable to access his Referral Information until he comes back and will leave a message for him in reference to following up with this patient's referral.   Chilon

## 2019-11-18 ENCOUNTER — Encounter: Payer: Medicare Other | Admitting: Internal Medicine

## 2019-11-18 ENCOUNTER — Other Ambulatory Visit: Payer: Self-pay | Admitting: Obstetrics and Gynecology

## 2019-11-21 ENCOUNTER — Other Ambulatory Visit (HOSPITAL_COMMUNITY): Payer: Self-pay | Admitting: Psychiatry

## 2019-11-21 DIAGNOSIS — F331 Major depressive disorder, recurrent, moderate: Secondary | ICD-10-CM

## 2019-11-22 ENCOUNTER — Encounter (HOSPITAL_COMMUNITY): Payer: Self-pay | Admitting: Emergency Medicine

## 2019-11-22 ENCOUNTER — Other Ambulatory Visit: Payer: Self-pay

## 2019-11-22 ENCOUNTER — Emergency Department (HOSPITAL_COMMUNITY)
Admission: EM | Admit: 2019-11-22 | Discharge: 2019-11-22 | Disposition: A | Discharge status: Home / Self Care | Payer: Medicare Other | Source: Home / Self Care | Attending: Emergency Medicine | Admitting: Emergency Medicine

## 2019-11-22 ENCOUNTER — Emergency Department (HOSPITAL_COMMUNITY): Payer: Medicare Other

## 2019-11-22 DIAGNOSIS — S8990XA Unspecified injury of unspecified lower leg, initial encounter: Secondary | ICD-10-CM

## 2019-11-22 DIAGNOSIS — Z7982 Long term (current) use of aspirin: Secondary | ICD-10-CM | POA: Insufficient documentation

## 2019-11-22 DIAGNOSIS — Z20822 Contact with and (suspected) exposure to covid-19: Secondary | ICD-10-CM | POA: Insufficient documentation

## 2019-11-22 DIAGNOSIS — R0600 Dyspnea, unspecified: Secondary | ICD-10-CM | POA: Insufficient documentation

## 2019-11-22 DIAGNOSIS — Z7952 Long term (current) use of systemic steroids: Secondary | ICD-10-CM | POA: Insufficient documentation

## 2019-11-22 DIAGNOSIS — M25462 Effusion, left knee: Secondary | ICD-10-CM | POA: Insufficient documentation

## 2019-11-22 DIAGNOSIS — I129 Hypertensive chronic kidney disease with stage 1 through stage 4 chronic kidney disease, or unspecified chronic kidney disease: Secondary | ICD-10-CM | POA: Insufficient documentation

## 2019-11-22 DIAGNOSIS — N189 Chronic kidney disease, unspecified: Secondary | ICD-10-CM | POA: Insufficient documentation

## 2019-11-22 DIAGNOSIS — Z87891 Personal history of nicotine dependence: Secondary | ICD-10-CM | POA: Insufficient documentation

## 2019-11-22 DIAGNOSIS — M25561 Pain in right knee: Secondary | ICD-10-CM | POA: Diagnosis not present

## 2019-11-22 DIAGNOSIS — I509 Heart failure, unspecified: Secondary | ICD-10-CM | POA: Diagnosis not present

## 2019-11-22 DIAGNOSIS — M25461 Effusion, right knee: Secondary | ICD-10-CM | POA: Insufficient documentation

## 2019-11-22 DIAGNOSIS — J90 Pleural effusion, not elsewhere classified: Secondary | ICD-10-CM | POA: Diagnosis not present

## 2019-11-22 DIAGNOSIS — I110 Hypertensive heart disease with heart failure: Secondary | ICD-10-CM | POA: Diagnosis not present

## 2019-11-22 DIAGNOSIS — W19XXXA Unspecified fall, initial encounter: Secondary | ICD-10-CM | POA: Insufficient documentation

## 2019-11-22 DIAGNOSIS — M25562 Pain in left knee: Secondary | ICD-10-CM | POA: Diagnosis not present

## 2019-11-22 DIAGNOSIS — R0602 Shortness of breath: Secondary | ICD-10-CM | POA: Insufficient documentation

## 2019-11-22 DIAGNOSIS — Z79899 Other long term (current) drug therapy: Secondary | ICD-10-CM | POA: Insufficient documentation

## 2019-11-22 DIAGNOSIS — R000 Tachycardia, unspecified: Secondary | ICD-10-CM | POA: Diagnosis not present

## 2019-11-22 LAB — BASIC METABOLIC PANEL
Anion gap: 11 (ref 5–15)
BUN: 7 mg/dL (ref 6–20)
CO2: 19 mmol/L — ABNORMAL LOW (ref 22–32)
Calcium: 8.7 mg/dL — ABNORMAL LOW (ref 8.9–10.3)
Chloride: 111 mmol/L (ref 98–111)
Creatinine, Ser: 1.05 mg/dL — ABNORMAL HIGH (ref 0.44–1.00)
GFR, Estimated: >60 mL/min (ref >60)
Glucose, Bld: 113 mg/dL — ABNORMAL HIGH (ref 70–99)
Potassium: 3.9 mmol/L (ref 3.5–5.1)
Sodium: 141 mmol/L (ref 135–145)

## 2019-11-22 LAB — I-STAT VENOUS BLOOD GAS, ED
Acid-Base Excess: 0 mmol/L (ref 0.0–2.0)
Bicarbonate: 22.8 mmol/L (ref 20.0–28.0)
Calcium, Ion: 0.97 mmol/L — ABNORMAL LOW (ref 1.15–1.40)
HCT: 39 % (ref 36.0–46.0)
Hemoglobin: 13.3 g/dL (ref 12.0–15.0)
O2 Saturation: 87 %
Potassium: 4.8 mmol/L (ref 3.5–5.1)
Sodium: 142 mmol/L (ref 135–145)
TCO2: 24 mmol/L (ref 22–32)
pCO2, Ven: 31 mmHg — ABNORMAL LOW (ref 44.0–60.0)
pH, Ven: 7.474 — ABNORMAL HIGH (ref 7.250–7.430)
pO2, Ven: 49 mmHg — ABNORMAL HIGH (ref 32.0–45.0)

## 2019-11-22 LAB — HEPATIC FUNCTION PANEL
ALT: 14 U/L (ref 0–44)
AST: 17 U/L (ref 15–41)
Albumin: 3.7 g/dL (ref 3.5–5.0)
Alkaline Phosphatase: 54 U/L (ref 38–126)
Bilirubin, Direct: 0.3 mg/dL — ABNORMAL HIGH (ref 0.0–0.2)
Indirect Bilirubin: 0.4 mg/dL (ref 0.3–0.9)
Total Bilirubin: 0.7 mg/dL (ref 0.3–1.2)
Total Protein: 6.8 g/dL (ref 6.5–8.1)

## 2019-11-22 LAB — RESPIRATORY PANEL BY RT PCR (FLU A&B, COVID)
Influenza A by PCR: NEGATIVE
Influenza B by PCR: NEGATIVE
SARS Coronavirus 2 by RT PCR: NEGATIVE

## 2019-11-22 LAB — CBC
HCT: 40.7 % (ref 36.0–46.0)
Hemoglobin: 12.4 g/dL (ref 12.0–15.0)
MCH: 26.2 pg (ref 26.0–34.0)
MCHC: 30.5 g/dL (ref 30.0–36.0)
MCV: 85.9 fL (ref 80.0–100.0)
Platelets: 388 10*3/uL (ref 150–400)
RBC: 4.74 MIL/uL (ref 3.87–5.11)
RDW: 19.1 % — ABNORMAL HIGH (ref 11.5–15.5)
WBC: 9.9 10*3/uL (ref 4.0–10.5)
nRBC: 0 % (ref 0.0–0.2)

## 2019-11-22 LAB — D-DIMER, QUANTITATIVE: D-Dimer, Quant: 0.42 ug/mL-FEU (ref 0.00–0.50)

## 2019-11-22 LAB — BRAIN NATRIURETIC PEPTIDE: B Natriuretic Peptide: 22.1 pg/mL (ref 0.0–100.0)

## 2019-11-22 IMAGING — CR DG CHEST 2V
2 series · 2 of 2 positions shown · non-contrast
Comparison: [DATE] and CT chest [DATE]

CLINICAL DATA: Fall, bilateral knee pain, difficulty breathing,
initial encounter.

EXAM:
CHEST - 2 VIEW

[chest lat]
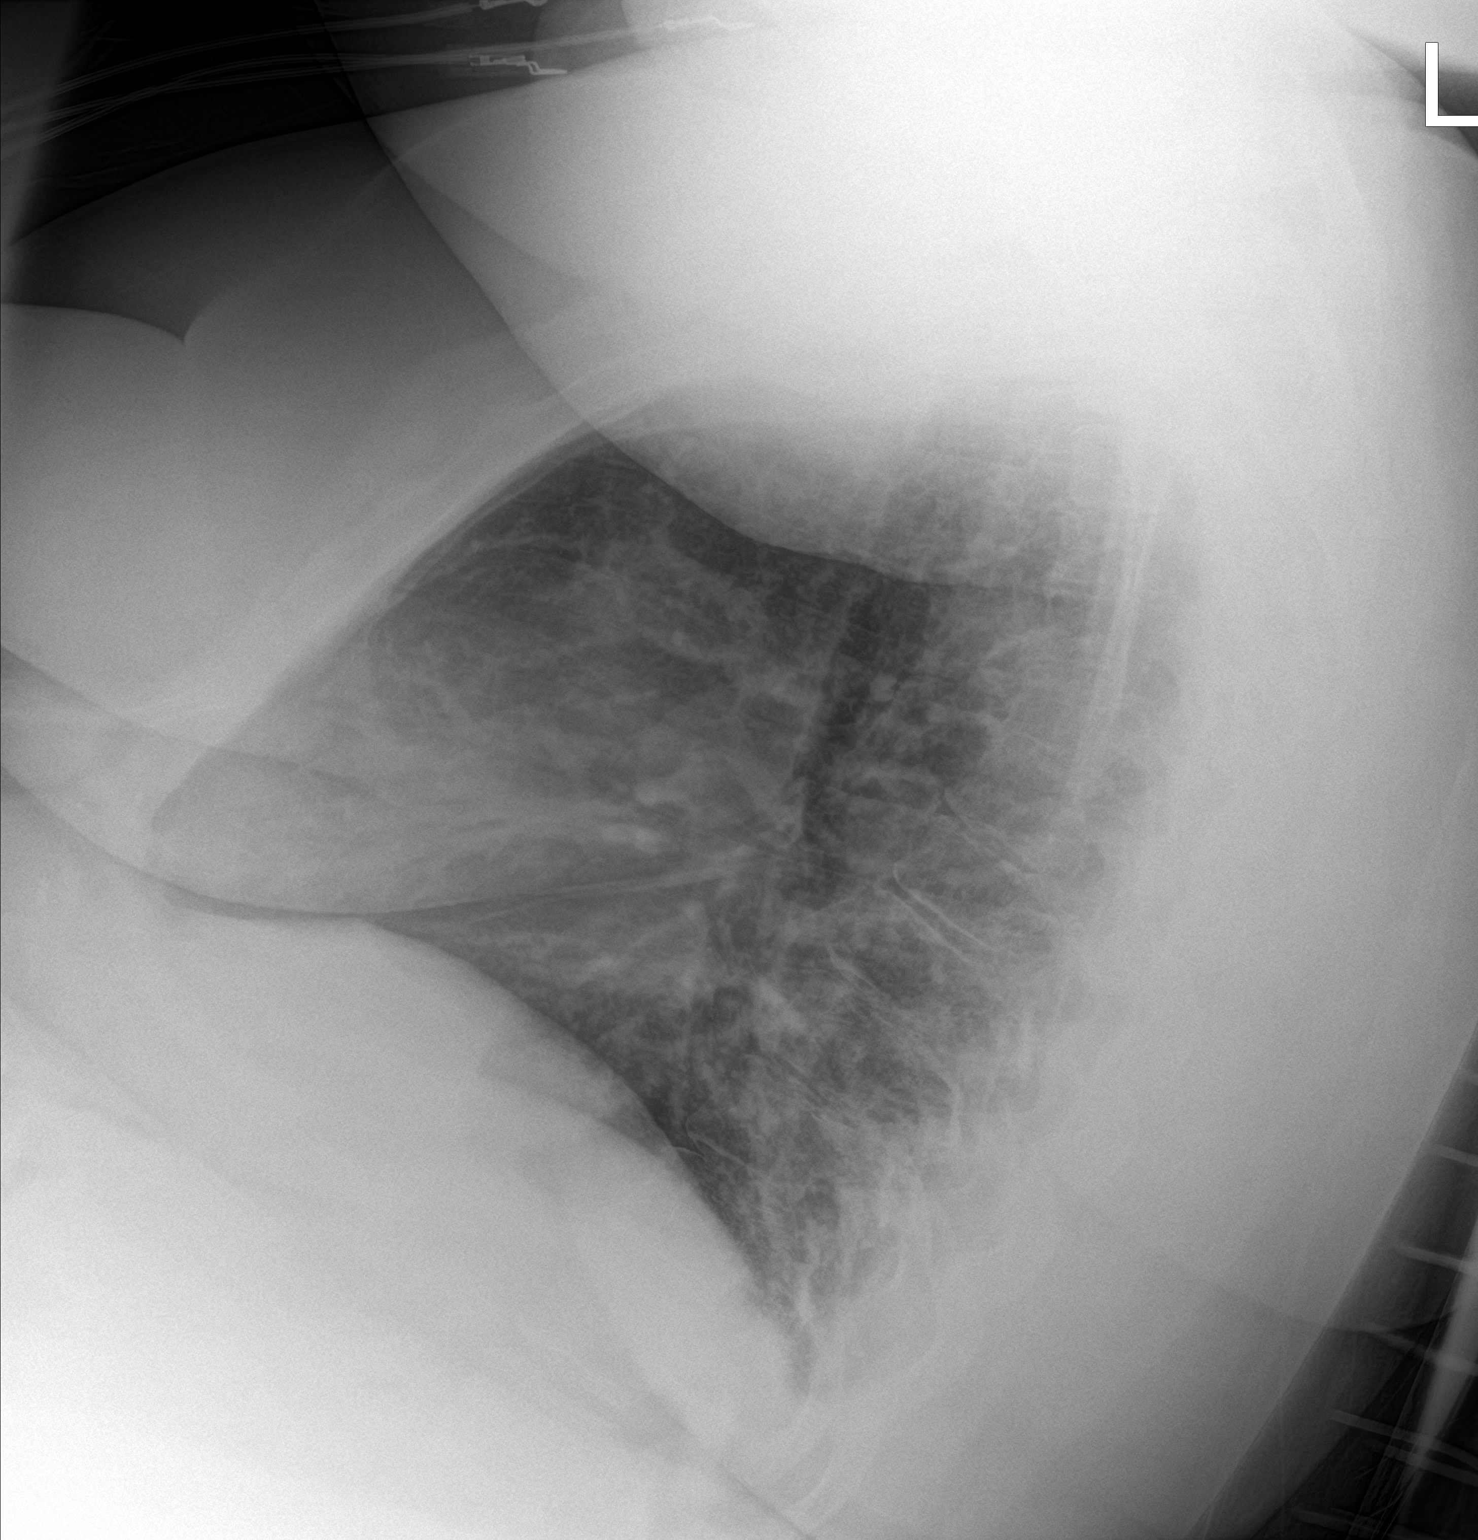

[chest ap]
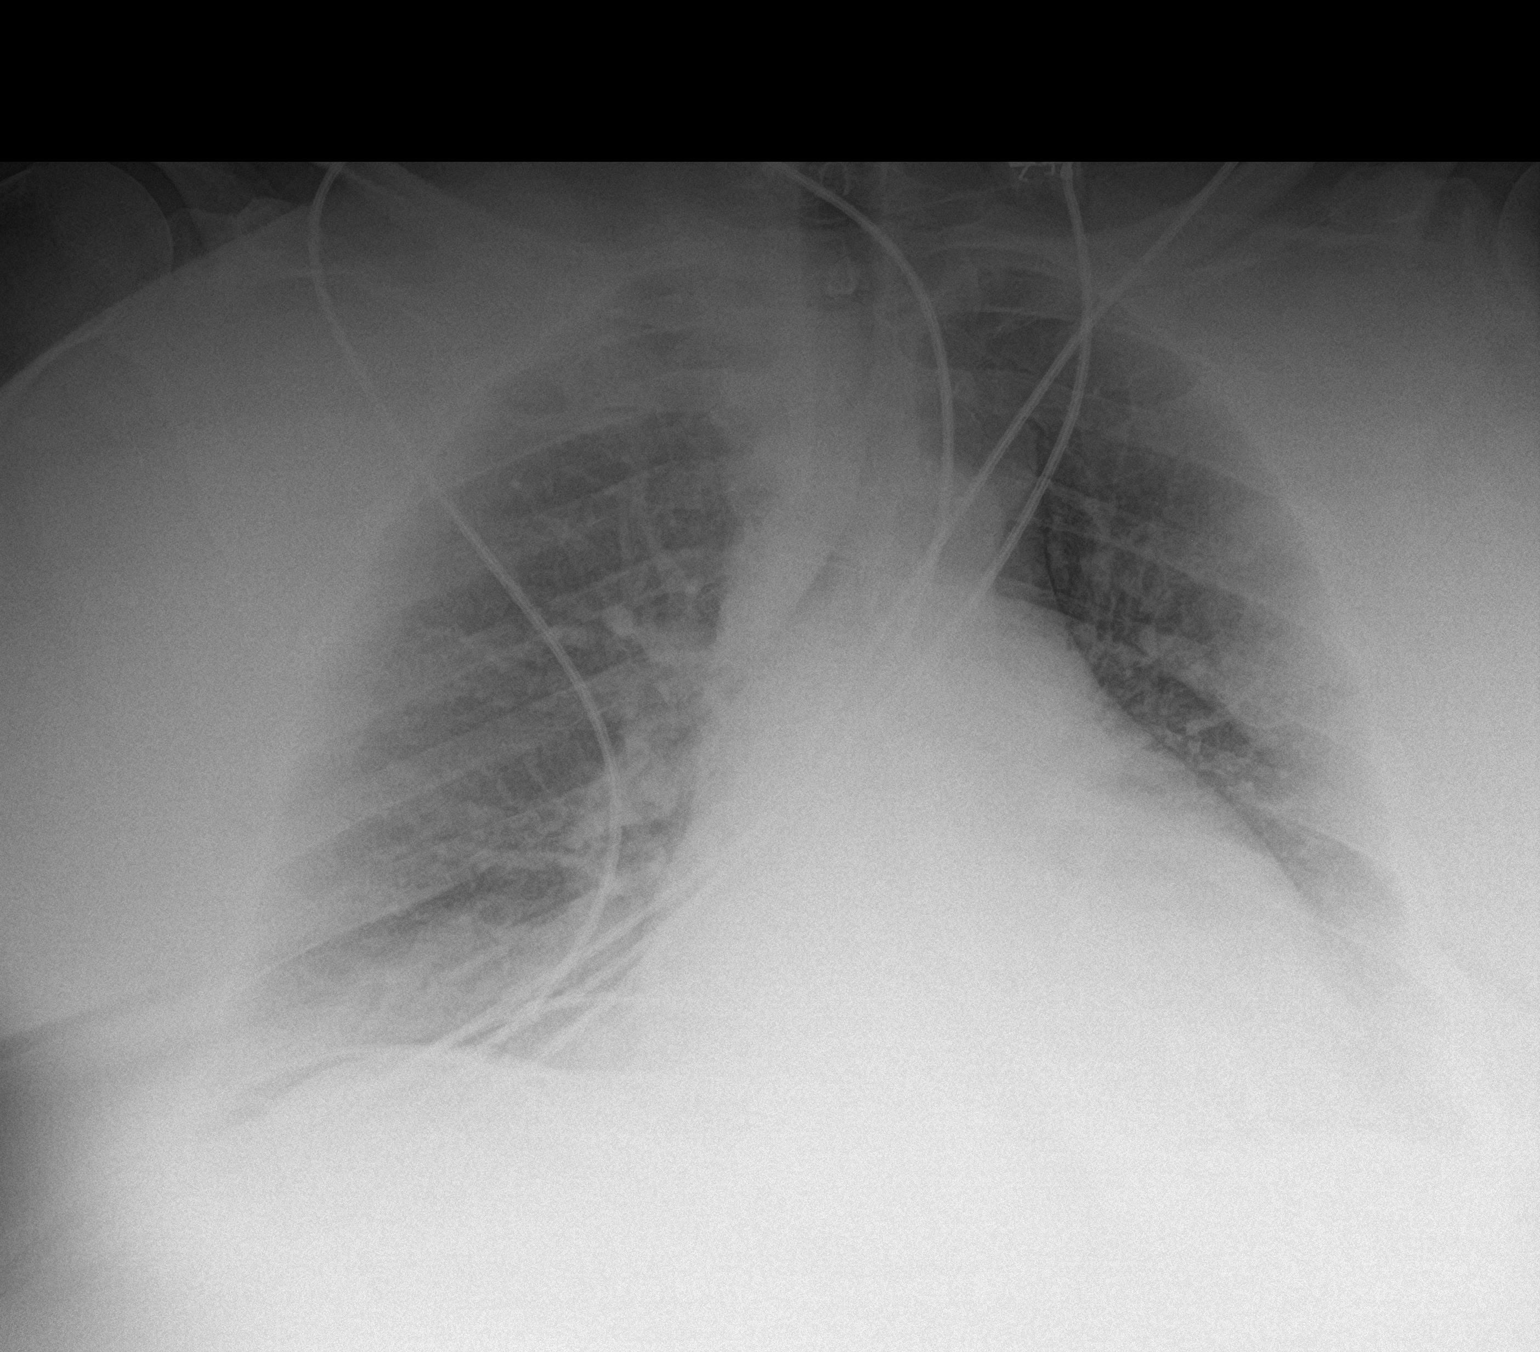

[2 of 2 positions shown; findings below may reference images not displayed]

FINDINGS: Image quality is degraded by body habitus. View is apical lordotic.
Heart size is accentuated by technique. There may be basilar
dependent interstitial prominence and indistinctness with trace
bilateral pleural effusions. No dense airspace consolidation.
IMPRESSION: Suspect mild congestive heart failure.

## 2019-11-22 IMAGING — CR DG KNEE COMPLETE 4+V*R*
4 series · 4 of 4 positions shown · non-contrast
Comparison: None.

CLINICAL DATA: Right knee pain after fall

EXAM:
RIGHT KNEE - COMPLETE 4+ VIEW

[knee ap]
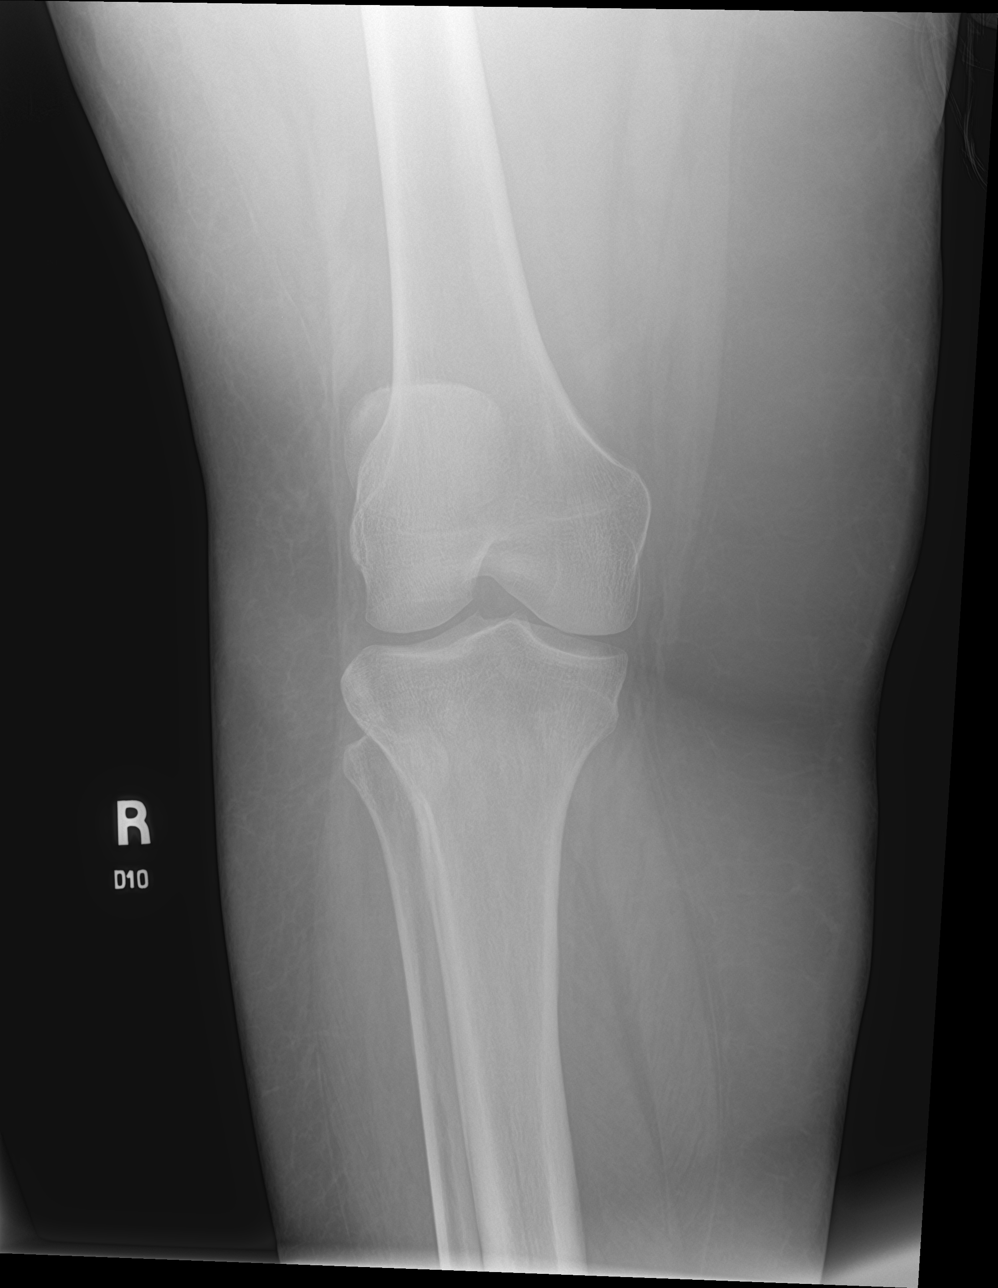

[knee lat]
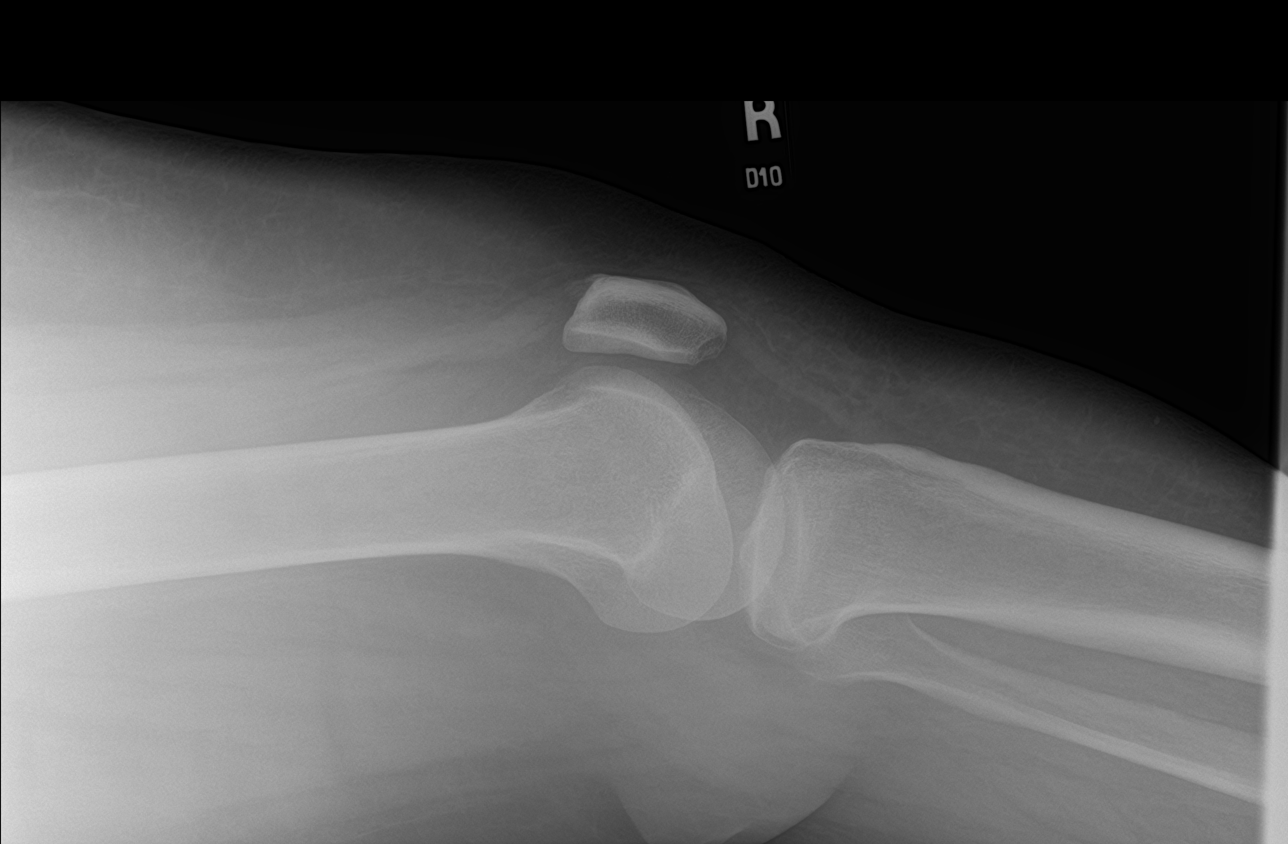

[knee obl (1 of 2)]
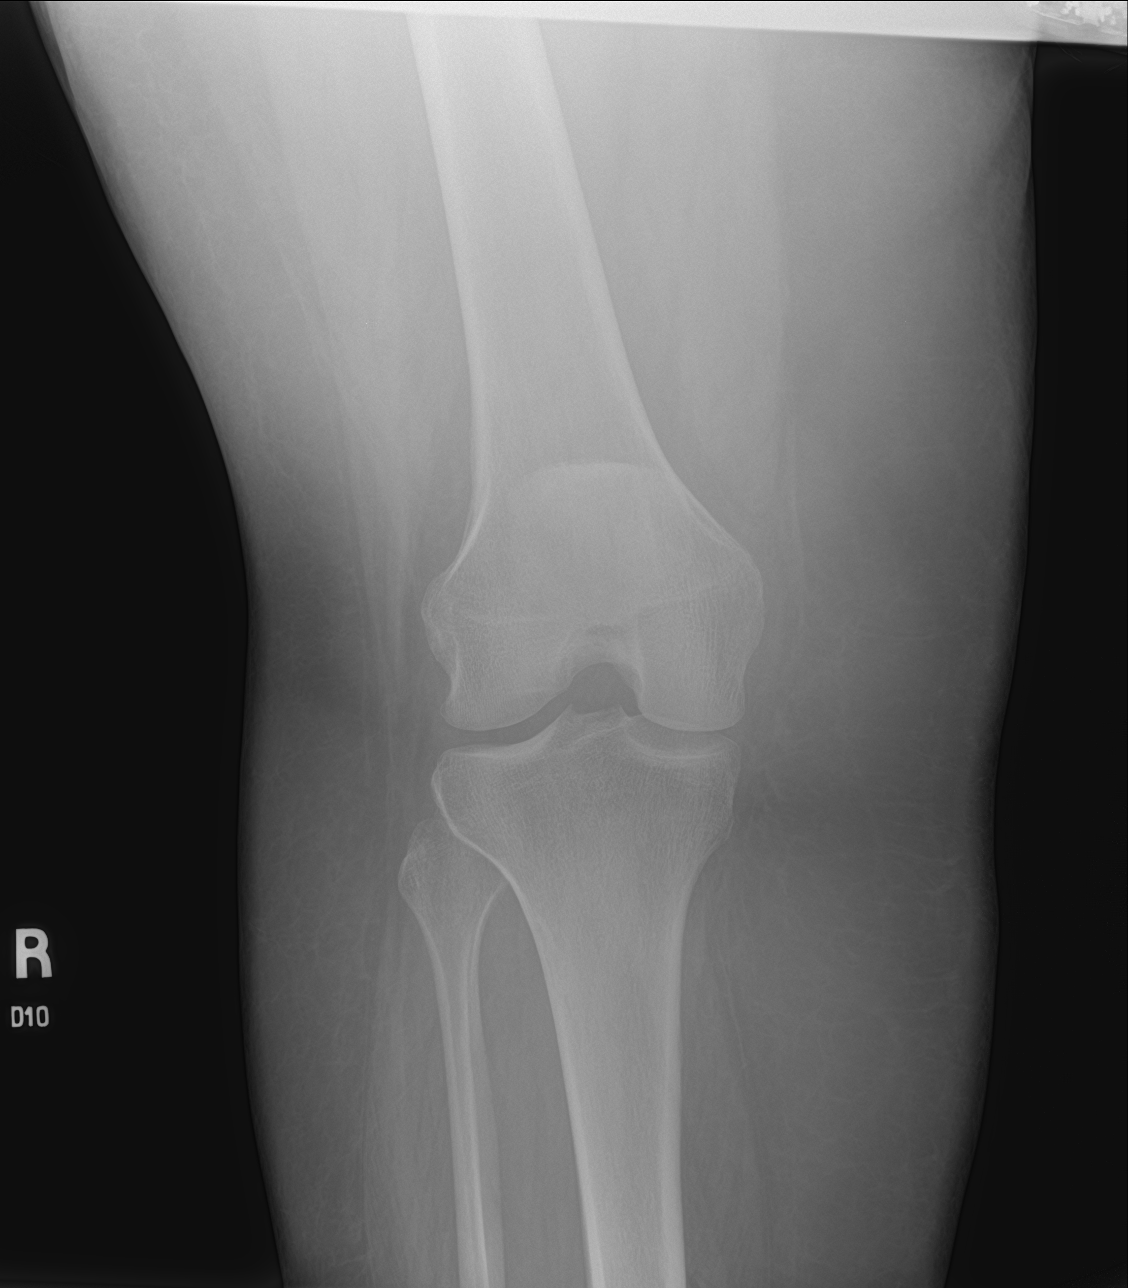

[knee obl (2 of 2)]
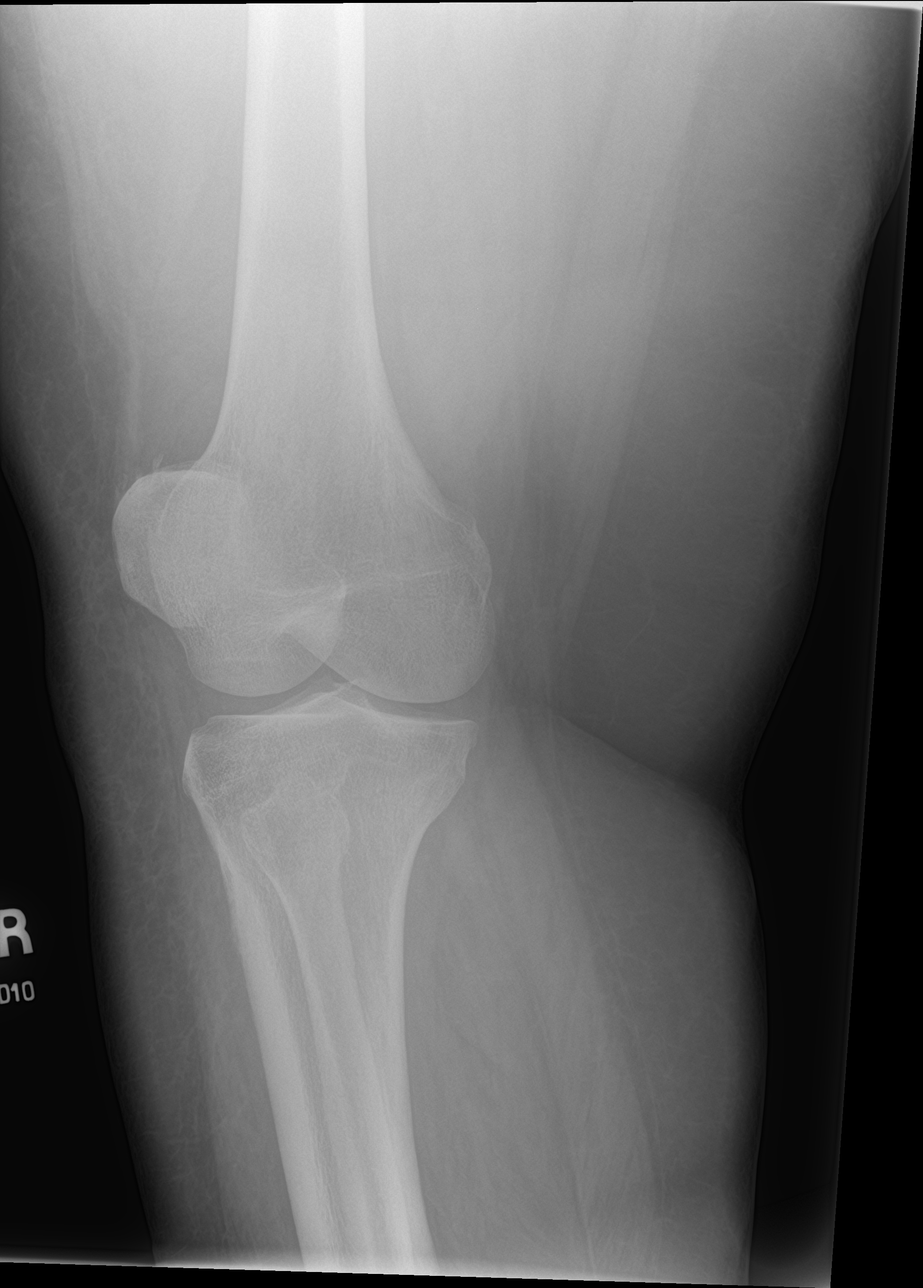

[4 of 4 positions shown; findings below may reference images not displayed]

FINDINGS: No evidence of fracture, dislocation, or joint effusion. No evidence
of arthropathy or other focal bone abnormality. Tiny enthesophyte at
the quadriceps tendon insertion. Soft tissues are unremarkable.
IMPRESSION: Negative.

## 2019-11-22 IMAGING — CR DG KNEE COMPLETE 4+V*L*
4 series · 4 of 4 positions shown · non-contrast
Comparison: None.

CLINICAL DATA: Left knee pain after fall

EXAM:
LEFT KNEE - COMPLETE 4+ VIEW

[knee ap]
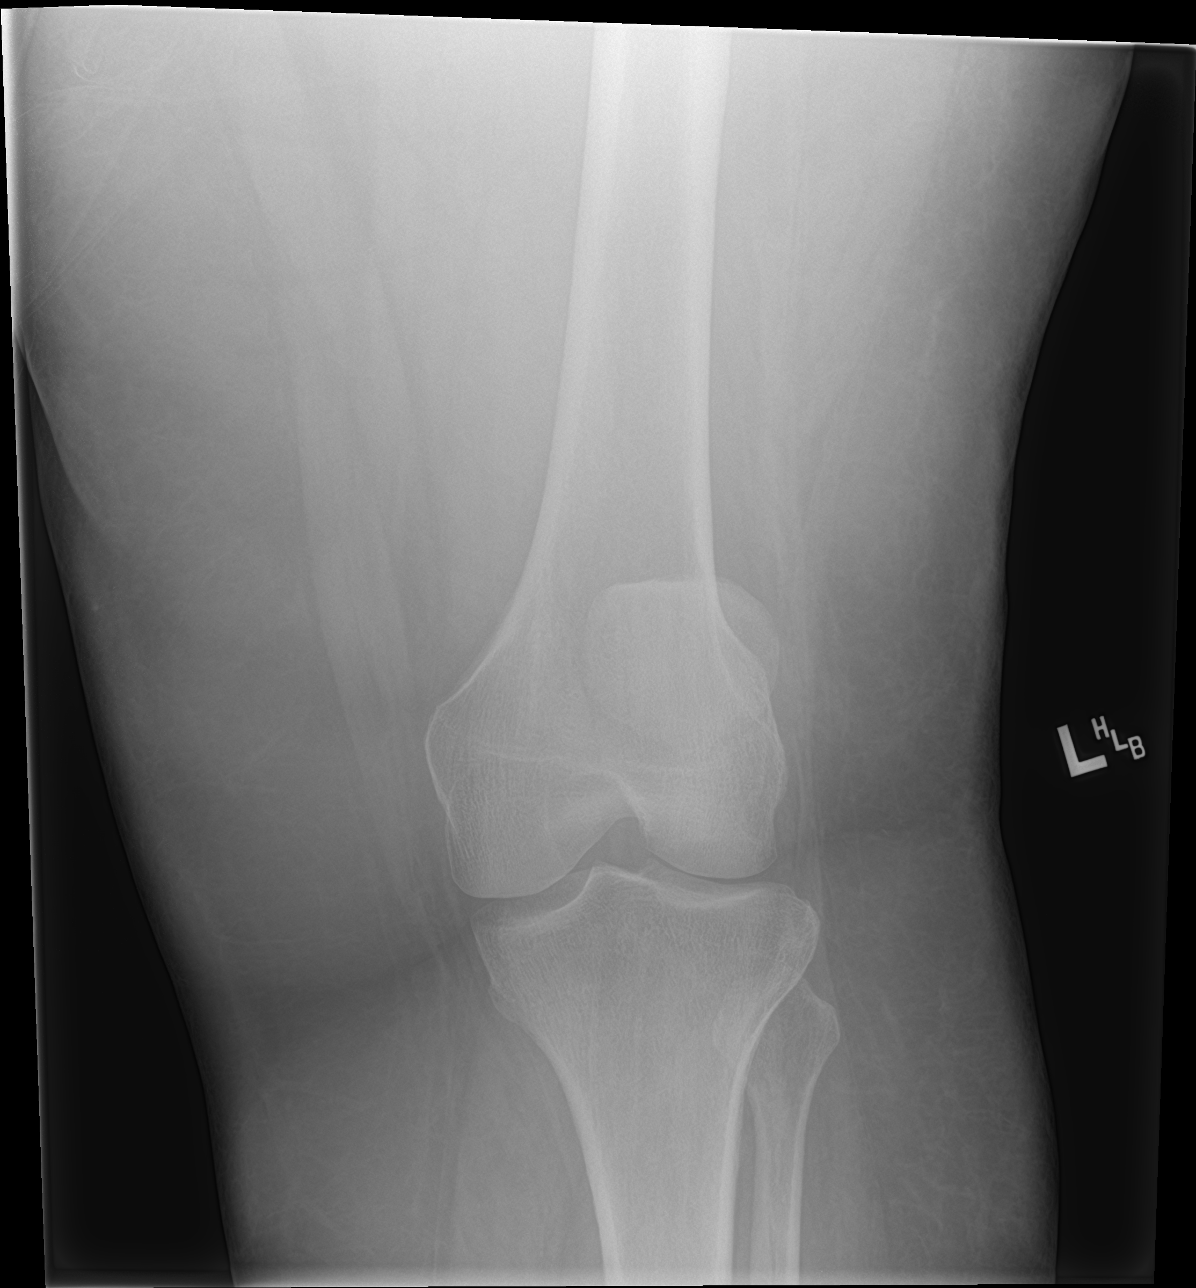

[knee lat]
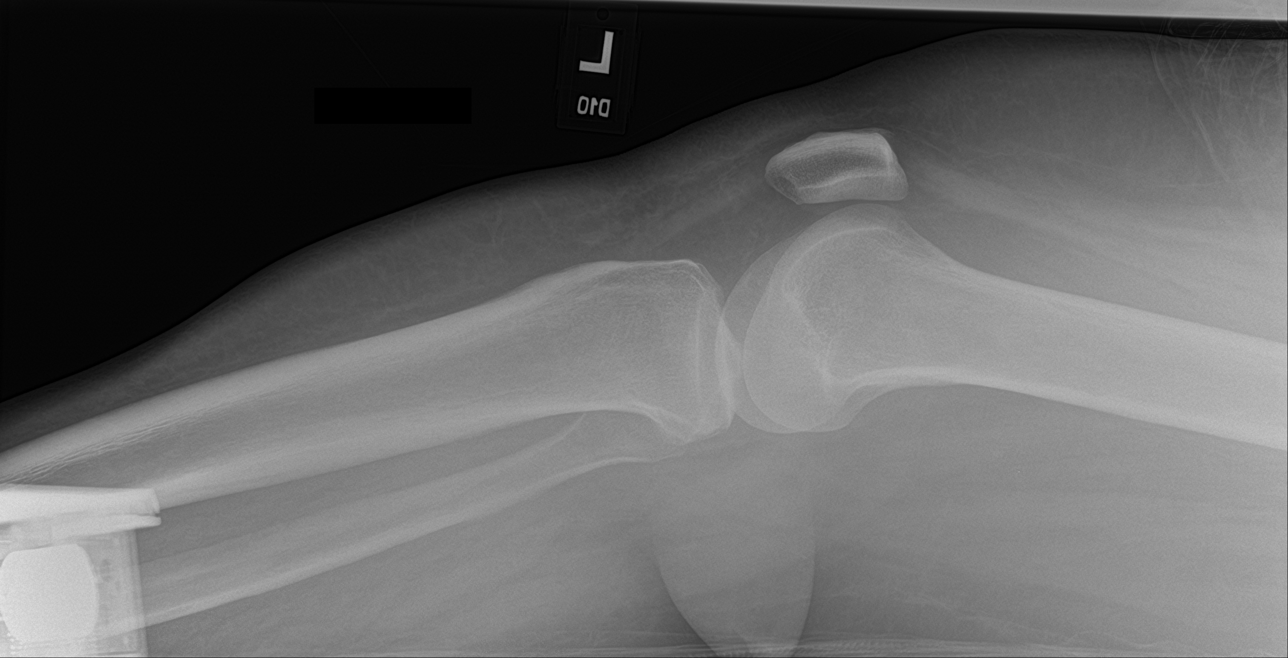

[knee obl (1 of 2)]
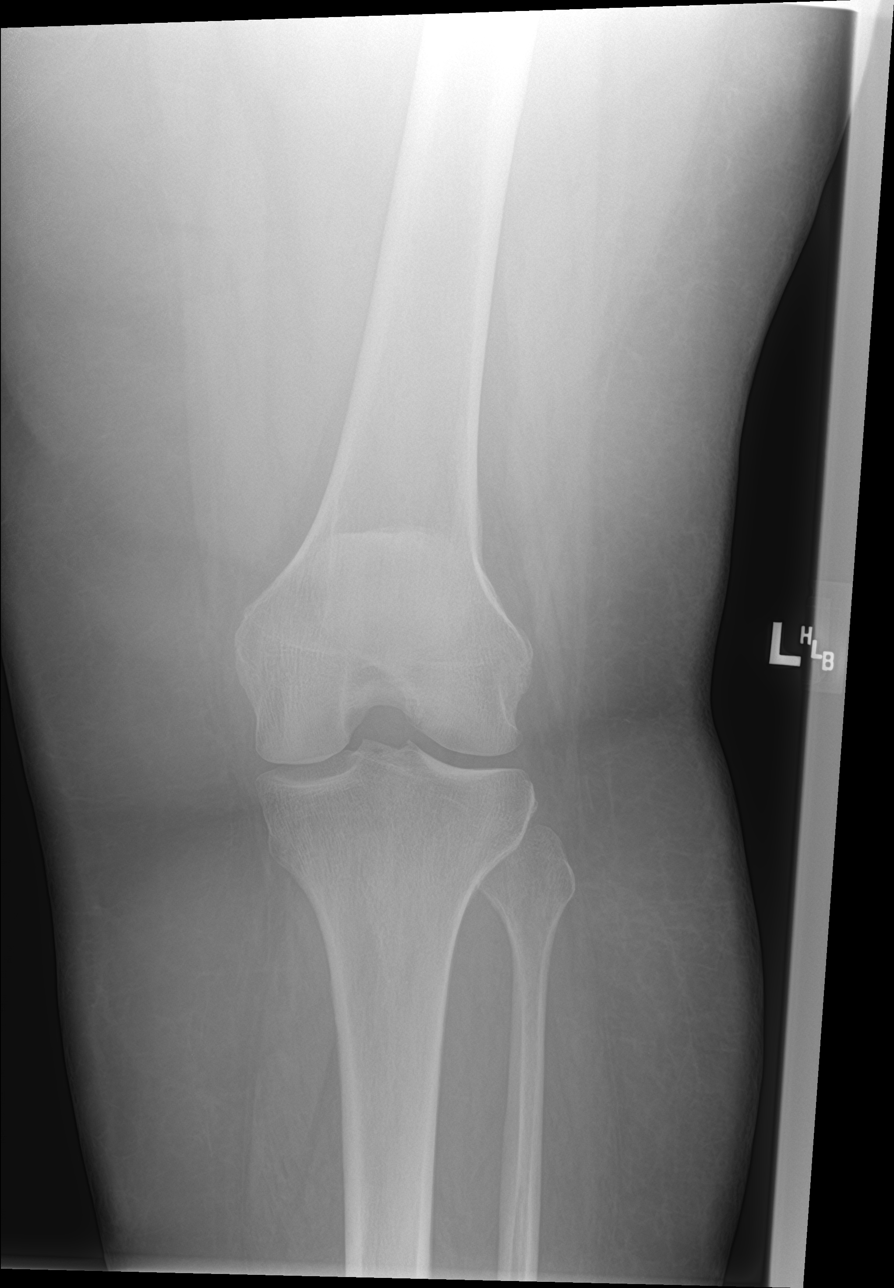

[knee obl (2 of 2)]
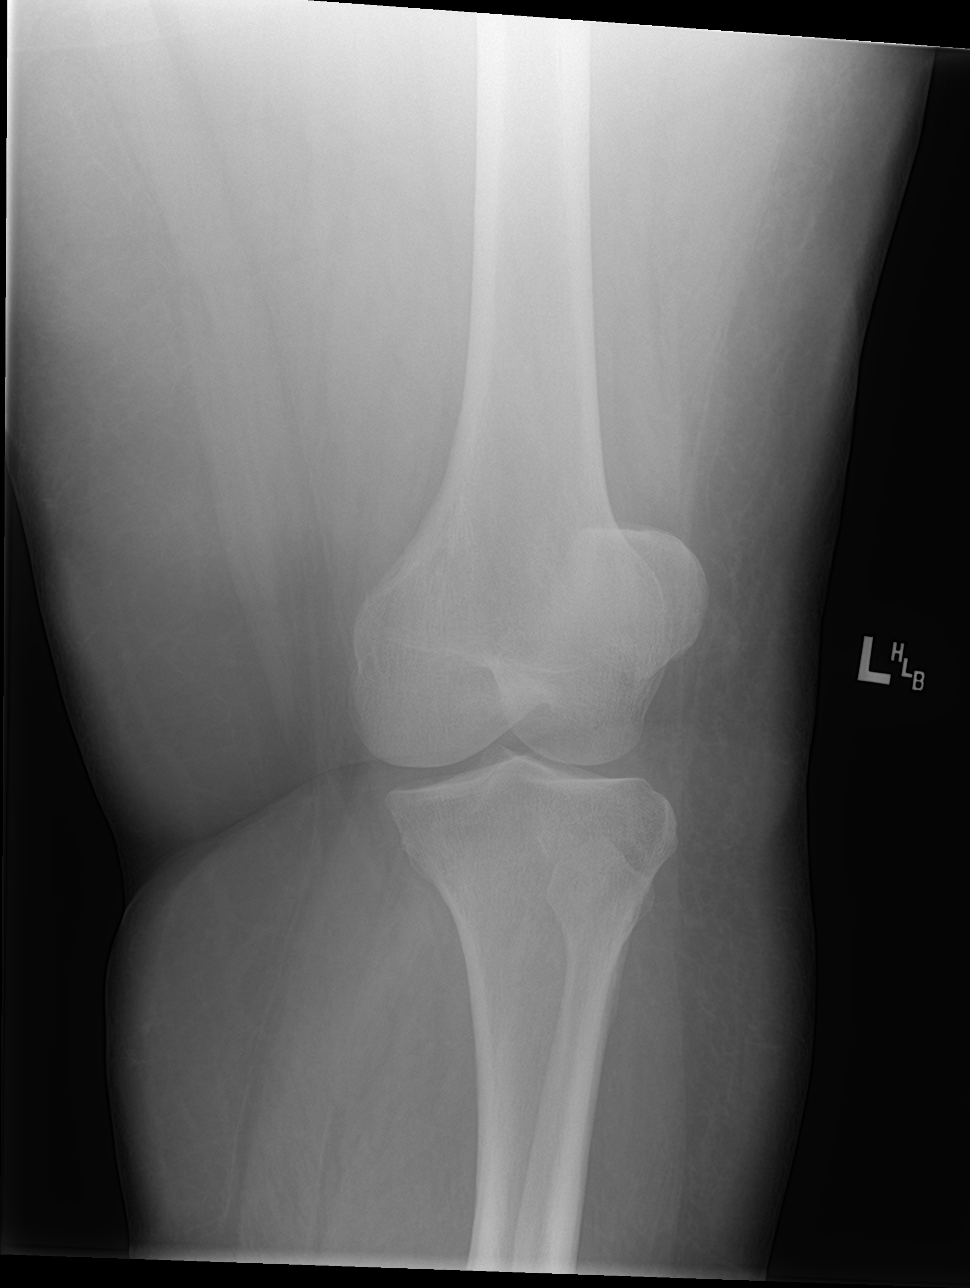

[4 of 4 positions shown; findings below may reference images not displayed]

FINDINGS: No evidence of fracture, dislocation, or joint effusion. No evidence
of arthropathy or other focal bone abnormality. Tiny enthesophyte at
the quadriceps tendon insertion. Soft tissues are unremarkable.
IMPRESSION: Negative.

## 2019-11-22 MED ORDER — ONDANSETRON HCL 4 MG/2ML IJ SOLN
4.0000 mg | Freq: Once | INTRAMUSCULAR | Status: AC
  Administered 2019-11-22: 4 mg via INTRAVENOUS
  Filled 2019-11-22: qty 2

## 2019-11-22 MED ORDER — FENTANYL CITRATE (PF) 100 MCG/2ML IJ SOLN
50.0000 ug | Freq: Once | INTRAMUSCULAR | Status: AC
  Administered 2019-11-22: 50 ug via INTRAVENOUS
  Filled 2019-11-22: qty 2

## 2019-11-22 MED ORDER — FUROSEMIDE 10 MG/ML IJ SOLN
20.0000 mg | Freq: Once | INTRAMUSCULAR | Status: AC
  Administered 2019-11-22: 20 mg via INTRAVENOUS
  Filled 2019-11-22: qty 2

## 2019-11-22 NOTE — ED Triage Notes (Signed)
Pt here from home with c/o sob , pt has copd and is on 5 liters O2 otc , pt arrived off O2 sats 100 % on 4 liters in triage , pt also fell on sat and is c/o knee and side pain from fall

## 2019-11-22 NOTE — ED Provider Notes (Signed)
[facility] EMERGENCY DEPARTMENT Provider Note   CSN: [id number] Arrival date & time: 11/22/19  5093     History No chief complaint on file.   Genevra Orne is a 49 y.o. female with an extensive past medical history who presents emergency department with chief complaint of fall and shortness of breath.  She has chronic respiratory failure with a history of COPD and is always on 5 L of oxygen via nasal cannula.  She states that last week she fell onto her butt both of her knees at her right side.  She complains of pain in her rib cage when she breathes, swelling in her legs and abdomen, severe knee pain bilaterally although she has been ambulatory.  She states that she called her pulmonologist last week because she noticed her breathing was worse and he placed her on a pack of prednisone however she states "today is a really bad day for my breathing."   She has been using her nebulizer without relief of her symptoms.  She has been placing Salonpas capsaicin patches on her body but denies use of lidocaine patches.  She denies a history of blood clots but has been less active due to her breathing and injuries.  She is not on any blood thinners.  She is vaccinated against the coronavirus.  She denies fevers, chills, nausea, vomiting, diarrhea.  She denies active chest pain except when taking deep breaths.  HPI     Past Medical History:  Diagnosis Date  . Acid reflux   . Anemia    Iron Def  . Anorexia   . Chronic kidney disease    Nephrotic syndrome  . Colon polyp 2009  . Depression with anxiety   . Edema leg   . Fibromyalgia   . Hemorrhoids   . Hidradenitis suppurativa   . Hypertension   . IBS (irritable bowel syndrome)   . Low back pain   . Migraines   . Morbidly obese ([place])   . Neuromuscular disorder (HCC)    fibromyalgia  . Neuropathy   . Panic attacks   . Polyp of vocal cord or larynx   . Tonsil pain     Patient Active Problem List   Diagnosis Date  Noted  . OSA (obstructive sleep apnea) 06/14/2019  . Right shoulder pain 04/21/2019  . Abdominal pain, acute, epigastric 04/07/2019  . Pain in gums 03/15/2019  . Compression fracture of body of thoracic vertebra ([place]) 03/08/2019  . Acute respiratory failure ([place]) 03/06/2019  . URI (upper respiratory infection) 02/24/2019  . Iron deficiency 02/10/2019  . History of colon polyps   . Vision blurring 09/17/2018  . Fall 06/30/2018  . Frequent urination at night 04/23/2018  . Chronic cough 03/04/2018  . Chronic back pain 12/05/2017  . Dental infection 12/05/2017  . Right arm pain 12/05/2017  . Chronic respiratory failure with hypoxia ([place])   . Low serum vitamin B12 05/02/2017  . Palpitations 04/11/2017  . Nutritional anemia 06/17/2016  . Allergic rhinitis 06/12/2016  . Vitamin D deficiency, unspecified 06/12/2016  . Nicotine dependence 06/12/2016  . Menorrhagia 01/08/2016  . Migraines 01/08/2016  . Bilateral leg pain 11/29/2015  . Hemorrhoids 07/05/2015  . Fibromyalgia 07/05/2015  . Chronic leg pain 09/26/2014  . Healthcare maintenance 09/26/2014  . Morbid obesity ([place]) 07/04/2014  . Major depressive disorder, recurrent episode, moderate ([place]) 10/13/2012  . Generalized anxiety disorder 10/13/2012  . Vaginal yeast infection 11/27/2010  . Chronic nausea 05/10/2010  . IBS (irritable bowel  syndrome) 06/16/2007  . Essential hypertension 02/06/2006  . Hidradenitis 11/18/2005    Past Surgical History:  Procedure Laterality Date  . AXILLARY HIDRADENITIS EXCISION    . COLONOSCOPY WITH PROPOFOL N/A 05/25/2015   Procedure: COLONOSCOPY WITH PROPOFOL;  Surgeon: Milus Banister, MD;  Location: [facility] ENDOSCOPY;  Service: Endoscopy;  Laterality: N/A;  . COLONOSCOPY WITH PROPOFOL N/A 12/31/2018   Procedure: COLONOSCOPY WITH PROPOFOL;  Surgeon: Milus Banister, MD;  Location: [facility] ENDOSCOPY;  Service: Endoscopy;  Laterality: N/A;  . HEMORRHOID SURGERY     with Hidradenitis surgery   . INGUINAL  HIDRADENITIS EXCISION    . TONSILLECTOMY  10/18/2010   by Dr. Wilburn Cornelia  . UPPER GASTROINTESTINAL ENDOSCOPY       OB History    Gravida  2   Para  1   Term      Preterm      AB  1   Living        SAB      TAB  1   Ectopic      Multiple      Live Births              Family History  Problem Relation Age of Onset  . Diabetes Mother   . Heart disease Mother        valve leak  . Anxiety disorder Mother   . Depression Mother   . High blood pressure Mother   . Kidney failure Mother   . Cancer Father        prostate  . Heart disease Father   . Learning disabilities Sister   . Depression Sister   . Depression Sister   . Anxiety disorder Sister   . Colon cancer Neg Hx     Social History   Tobacco Use  . Smoking status: Former Smoker    Packs/day: 0.10    Years: 25.00    Pack years: 2.50    Types: Cigarettes    Quit date: 05/24/2019    Years since quitting: 0.4  . Smokeless tobacco: Never Used  . Tobacco comment: quit in April.  Vaping Use  . Vaping Use: Never used  Substance Use Topics  . Alcohol use: Not Currently    Alcohol/week: 0.0 standard drinks    Comment: none sine 05/2018  . Drug use: Not Currently    Frequency: 3.0 times per week    Types: Marijuana    Home Medications Prior to Admission medications   Medication Sig Start Date End Date Taking? Authorizing Provider  acetaminophen (TYLENOL) 650 MG CR tablet Take 1,300 mg by mouth every 8 (eight) hours as needed for pain.   Yes [provider]  albuterol (PROVENTIL) (2.5 MG/3ML) 0.083% nebulizer solution TAKE 3 MLS (2.5 MG TOTAL) BY NEBULIZATION EVERY 4 (FOUR) HOURS. 08/31/19  Yes Olalere, Adewale A, MD  albuterol (VENTOLIN HFA) 108 (90 Base) MCG/ACT inhaler TAKE 2 PUFFS BY MOUTH EVERY 6 HOURS AS NEEDED FOR WHEEZE OR SHORTNESS OF BREATH Patient taking differently: Inhale 2 puffs into the lungs every 6 (six) hours as needed for wheezing or shortness of breath.  10/14/19  Yes Olalere,  Adewale A, MD  amLODipine (NORVASC) 10 MG tablet TAKE 1/2 TABLET BY MOUTH EVERY DAY Patient taking differently: Take 5 mg by mouth daily.  08/13/19  Yes Velna Ochs, MD  Ascorbic Acid (VITAMIN C PO) Take 1 tablet by mouth daily.   Yes [provider]  Aspirin-Salicylamide-Caffeine (BC HEADACHE POWDER PO) Take 2  Packages by mouth as needed (headache).   Yes [provider]  Benzocaine 20 % OINT Apply 1 application topically as needed. Patient taking differently: Apply 1 application topically as needed (pain).  06/24/19  Yes Mosetta Anis, MD  BIOTIN PO Take 1 tablet by mouth daily.   Yes [provider]  Brexpiprazole (REXULTI) 3 MG TABS Take 1 tablet (3 mg total) by mouth daily. 09/15/19  Yes [name], Arlyce Harman, MD  Capsaicin (CAPSAICIN HEAT PATCH) 0.025 % PADS Apply 1 patch topically 3 (three) times daily as needed. 10/27/19  Yes Maudie Mercury, MD  cetirizine (ZYRTEC) 10 MG tablet Take 1 tablet (10 mg total) by mouth daily. 06/07/19  Yes Martyn Ehrich, NP  cholecalciferol (VITAMIN D) 25 MCG (1000 UNIT) tablet Take 1 tablet (1,000 Units total) by mouth daily. 07/23/19  Yes Velna Ochs, MD  clindamycin (CLINDAGEL) 1 % gel APPLY TO AFFECTED AREA TWICE A DAY Patient taking differently: Apply 1 application topically 2 (two) times daily as needed (flare up).  11/10/19  Yes Oda Kilts, MD  clorazepate (TRANXENE) 3.75 MG tablet Take 1 tablet (3.75 mg total) by mouth daily. 09/15/19  Yes [name], Arlyce Harman, MD  famotidine (PEPCID) 20 MG tablet Take 1 tablet (20 mg total) by mouth 2 (two) times daily for 14 days. 11/03/19 11/22/19 Yes Axel Filler, MD  fluticasone (FLONASE) 50 MCG/ACT nasal spray PLACE 1 SPRAY INTO BOTH NOSTRILS DAILY. Patient taking differently: Place 1 spray into both nostrils daily as needed for allergies.  09/22/19  Yes Martyn Ehrich, NP  hydrochlorothiazide (HYDRODIURIL) 25 MG tablet TAKE 1 TABLET BY MOUTH EVERY DAY Patient taking  differently: Take 25 mg by mouth daily.  05/31/19  Yes Velna Ochs, MD  hydrOXYzine (VISTARIL) 50 MG capsule Take 1 capsule (50 mg total) by mouth at bedtime as needed for anxiety. 09/15/19  Yes [name], Arlyce Harman, MD  ibuprofen (ADVIL) 800 MG tablet Take 1 tablet (800 mg total) by mouth every 8 (eight) hours as needed. Patient taking differently: Take 800 mg by mouth every 8 (eight) hours as needed for moderate pain.  10/26/19  Yes Maudie Mercury, MD  lamoTRIgine (LAMICTAL) 150 MG tablet Take one tab daily Patient taking differently: Take 150 mg by mouth daily.  09/15/19  Yes [name], Arlyce Harman, MD  megestrol (MEGACE) 40 MG tablet Take 2 tablets (80 mg total) by mouth 2 (two) times daily. Can increase to two tablets twice a day in the event of heavy bleeding 10/26/19 11/25/19 Yes Aletha Halim, MD  naproxen (NAPROSYN) 500 MG tablet Take 1 tablet (500 mg total) by mouth 2 (two) times daily with a meal. 11/01/19 10/31/20 Yes [place], Carley D, DO  omeprazole (PRILOSEC) 40 MG capsule TAKE 1 CAPSULE BY MOUTH EVERY DAY Patient taking differently: Take 40 mg by mouth daily.  05/05/19  Yes Velna Ochs, MD  OXYGEN Inhale 5 L into the lungs.    Yes [provider]  promethazine (PHENERGAN) 12.5 MG tablet TAKE 1 TABLET BY MOUTH EVERY 6 HOURS AS NEEDED FOR NAUSEA OR VOMITING. Patient taking differently: Take 12.5 mg by mouth every 6 (six) hours as needed for nausea or vomiting. TAKE 1 TABLET BY MOUTH EVERY 6 HOURS AS NEEDED FOR NAUSEA OR VOMITING. 09/22/19  Yes Milus Banister, MD  SYMBICORT 160-4.5 MCG/ACT inhaler Inhale 2 puffs into the lungs in the morning and at bedtime. 09/23/19  Yes Rigoberto Noel, MD  valsartan (DIOVAN) 80 MG tablet Take 2 tablets (  160 mg total) by mouth daily. Patient taking differently: Take 240 mg by mouth daily. 3 tablets daily 06/14/19  Yes Mosetta Anis, MD  vitamin B-12 (CYANOCOBALAMIN) 1000 MCG tablet Take 1 tablet (1,000 mcg total) by mouth daily. 05/06/17  Yes Velna Ochs, MD  Vitamin E 400 units TABS Take 400 Units by mouth daily.    Yes [provider]  Capsaicin-Cleansing Gel (QUTENZA, 4 PATCH,) 8 % KIT Apply 1 patch topically in the morning, at noon, and at bedtime. Patient not taking: Reported on 11/22/2019 10/26/19   Maudie Mercury, MD  CHANTIX CONTINUING MONTH PAK 1 MG tablet TAKE 1 TABLET BY MOUTH TWICE A DAY Patient not taking: Reported on 11/22/2019 05/20/19   Laurin Coder, MD  doxycycline (VIBRA-TABS) 100 MG tablet Take 1 tablet (100 mg total) by mouth 2 (two) times daily. Patient not taking: Reported on 11/22/2019 11/03/19   Axel Filler, MD  doxycycline (VIBRA-TABS) 100 MG tablet Take 1 tablet (100 mg total) by mouth 2 (two) times daily. Patient not taking: Reported on 11/22/2019 11/08/19   Laurin Coder, MD  gabapentin (NEURONTIN) 600 MG tablet Take 1 tablet (600 mg total) by mouth at bedtime. Patient not taking: Reported on 09/15/2019 07/21/19   Velna Ochs, MD  lidocaine (XYLOCAINE) 5 % ointment Apply 1 application topically 2 (two) times daily as needed. Patient not taking: Reported on 11/22/2019 11/03/19   Axel Filler, MD  predniSONE (DELTASONE) 20 MG tablet Take 1 tablet (20 mg total) by mouth daily with breakfast. Patient not taking: Reported on 11/22/2019 11/08/19   Laurin Coder, MD  predniSONE (DELTASONE) 20 MG tablet Take 1 tablet (20 mg total) by mouth daily with breakfast. Patient not taking: Reported on 11/22/2019 11/08/19   Laurin Coder, MD    Allergies    Lisinopril  Review of Systems   Review of Systems Ten systems reviewed and are negative for acute change, except as noted in the HPI.   Physical Exam Updated Vital Signs BP 128/60   Pulse (!) 108   Temp 98.7 F (37.1 C) (Oral)   Resp (!) 25   SpO2 99%   Physical Exam Vitals and nursing note reviewed.  Constitutional:      Appearance: She is obese.     Comments: Pickwickian body habitus  HENT:     Head:  Normocephalic and atraumatic.     Mouth/Throat:     Mouth: Mucous membranes are dry.  Eyes:     Extraocular Movements: Extraocular movements intact.     Pupils: Pupils are equal, round, and reactive to light.  Neck:     Comments: Unable to assess JVD secondary to body habitus Cardiovascular:     Rate and Rhythm: Tachycardia present.     Comments: Lower extremities with mild swelling, symmetric Pulmonary:     Effort: Tachypnea present.     Breath sounds: Normal breath sounds and air entry. No decreased air movement. No decreased breath sounds, wheezing, rhonchi or rales.     Comments: Patient is clearly very winded, tachypneic without coughing, wheezing, prolonged expiratory phase. Rhonchi or rails.  Air movement sounds excellent Chest:     Chest wall: No tenderness or crepitus.  Abdominal:    Musculoskeletal:     Cervical back: Normal range of motion and neck supple.     Right lower leg: 1+ Edema present.     Left lower leg: 1+ Edema present.     Comments: Evaluation limited secondary to  body habitus however there is no obvious deformity, effusion of the bilateral knees.  She is exquisitely tender to even the lightest touch of the skin over her knees however able to move them appropriately with normal range of motion.  Neurological:     Mental Status: She is alert.     ED Results / Procedures / Treatments   Labs (all labs ordered are listed, but only abnormal results are displayed) Labs Reviewed  BASIC METABOLIC PANEL - Abnormal; Notable for the following components:      Result Value   CO2 19 (*)    Glucose, Bld 113 (*)    Creatinine, Ser 1.05 (*)    Calcium 8.7 (*)    All other components within normal limits  CBC - Abnormal; Notable for the following components:   RDW 19.1 (*)    All other components within normal limits  HEPATIC FUNCTION PANEL - Abnormal; Notable for the following components:   Bilirubin, Direct 0.3 (*)    All other components within normal limits   I-STAT VENOUS BLOOD GAS, ED - Abnormal; Notable for the following components:   pH, Ven 7.474 (*)    pCO2, Ven 31.0 (*)    pO2, Ven 49.0 (*)    Calcium, Ion 0.97 (*)    All other components within normal limits  RESPIRATORY PANEL BY RT PCR (FLU A&B, COVID)  BRAIN NATRIURETIC PEPTIDE  D-DIMER, QUANTITATIVE (NOT AT [facility])    EKG EKG Interpretation  Date/Time:  Monday November 22 2019 10:18:22 EDT Ventricular Rate:  104 PR Interval:  146 QRS Duration: 86 QT Interval:  363 QTC Calculation: 478 R Axis:   77 Text Interpretation: Sinus tachycardia Anteroseptal infarct, age indeterminate similar to earlier in the day Confirmed by Sherwood Gambler [phone]) on 11/22/2019 10:21:00 AM   Radiology DG Chest 2 View  Result Date: 11/22/2019 CLINICAL DATA:  Fall, bilateral knee pain, difficulty breathing, initial encounter. EXAM: CHEST - 2 VIEW COMPARISON:  05/03/2019 and CT chest 03/06/2019 FINDINGS: Image quality is degraded by body habitus. View is apical lordotic. Heart size is accentuated by technique. There may be basilar dependent interstitial prominence and indistinctness with trace bilateral pleural effusions. No dense airspace consolidation. IMPRESSION: Suspect mild congestive heart failure. Electronically Signed   By: Lorin Picket M.D.   On: 11/22/2019 11:03   DG Knee Complete 4 Views Left  Result Date: 11/22/2019 CLINICAL DATA:  Left knee pain after fall EXAM: LEFT KNEE - COMPLETE 4+ VIEW COMPARISON:  None. FINDINGS: No evidence of fracture, dislocation, or joint effusion. No evidence of arthropathy or other focal bone abnormality. Tiny enthesophyte at the quadriceps tendon insertion. Soft tissues are unremarkable. IMPRESSION: Negative. Electronically Signed   By: Davina Poke D.O.   On: 11/22/2019 11:03   DG Knee Complete 4 Views Right  Result Date: 11/22/2019 CLINICAL DATA:  Right knee pain after fall EXAM: RIGHT KNEE - COMPLETE 4+ VIEW COMPARISON:  None. FINDINGS: No evidence  of fracture, dislocation, or joint effusion. No evidence of arthropathy or other focal bone abnormality. Tiny enthesophyte at the quadriceps tendon insertion. Soft tissues are unremarkable. IMPRESSION: Negative. Electronically Signed   By: Davina Poke D.O.   On: 11/22/2019 11:04    Procedures Procedures (including critical care time)  Medications Ordered in ED Medications  fentaNYL (SUBLIMAZE) injection 50 mcg (50 mcg Intravenous Given 11/22/19 1100)  ondansetron (ZOFRAN) injection 4 mg (4 mg Intravenous Given 11/22/19 1100)  furosemide (LASIX) injection 20 mg (20 mg Intravenous Given 11/22/19 1327)  ED Course  I have reviewed the triage vital signs and the nursing notes.  Pertinent labs & imaging results that were available during my care of the patient were reviewed by me and considered in my medical decision making (see chart for details).  Clinical Course as of Nov 22 2023  Mon Nov 22, 2019  1146 Resp alkalosis   I-Stat venous blood gas, ED(!) [AH]    Clinical Course User Index [AH] Margarita Mail, PA-C   MDM Rules/Calculators/A&P                          CC:sob  VS:  Vitals:   11/22/19 1300 11/22/19 1315 11/22/19 1330 11/22/19 1430  BP: 137/89  (!) 142/88 128/60  Pulse:  (!) 105 (!) 104 (!) 108  Resp: 19 16 (!) 21 (!) 25  Temp:      TempSrc:      SpO2:  98% 99% 99%   LA:GTXMIWO is gathered by patient and emr. Previous records obtained and reviewed. DDX:The patient's complaint of sob involves an extensive number of diagnostic and treatment options, and is a complaint that carries with it a high risk of complications, morbidity, and potential mortality. Given the large differential diagnosis, medical decision making is of high complexity. The emergent differential diagnosis for shortness of breath includes, but is not limited to, Pulmonary edema, bronchoconstriction, Pneumonia, Pulmonary embolism, Pneumotherax/ Hemothorax, Dysrythmia, ACS.   Labs: I ordered  reviewed and interpreted labs which include D-dimer which is within normal limits Respiratory panel negative for Covid or influenza, hepatic function panel without abnormality, CBC and BMP without significant abnormality.  I-STAT VBG shows respiratory alkalosis. Imaging: I ordered and reviewed images which included chest x-ray. I independently visualized and interpreted all imaging. Significant findings include questionable pulmonary edema, no evidence of infiltrates. EKG: Sinus tachycardia at a rate of 104 Consults: I consulted with internal medicine teaching service to secure a very close follow-up appointment for the patient this coming Thursday MDM: Patient here with complaints of knee pain.  She is also having "a bad breathing day" with her history of chronic respiratory failure on 5 L.  Her chest x-ray was questionable for pulmonary edema.  She does not have a history of CHF however it is difficult to ascertain if she was volume overloaded based on her body habitus.  I gave her 20 mg of IV Lasix.  She had some urinary output and feels like her breathing is significantly improved.  Given these facts I think the patient would need close outpatient follow-up for echocardiogram to assess for potential evidence of heart failure.  She is at her baseline with her breathing.  She has not had any low oxygen saturation on her 5 L.  Thing is she is safe to discharge home with very close outpatient follow-up and strict return precautions.   Kadedra Vanaken was evaluated in Emergency Department on 11/22/2019 for the symptoms described in the history of present illness. She was evaluated in the context of the global COVID-19 pandemic, which necessitated consideration that the patient might be at risk for infection with the SARS-CoV-2 virus that causes COVID-19. Institutional protocols and algorithms that pertain to the evaluation of patients at risk for COVID-19 are in a state of rapid change based on information  released by regulatory bodies including the CDC and federal and state organizations. These policies and algorithms were followed during the patient's care in the ED.  Patient disposition:The patient appears reasonably  screened and/or stabilized for discharge and I doubt any other medical condition or other [facility] requiring further screening, evaluation, or treatment in the ED at this time prior to discharge. I have discussed lab and/or imaging findings with the patient and answered all questions/concerns to the best of my ability.I have discussed return precautions and OP follow up.    Final Clinical Impression(s) / ED Diagnoses Final diagnoses:  Dyspnea, unspecified type  Knee injury, unspecified laterality, initial encounter    Rx / DC Orders ED Discharge Orders    None       Margarita Mail, PA-C 11/22/19 2025    Sherwood Gambler, MD 11/23/19 [id number]

## 2019-11-22 NOTE — ED Notes (Signed)
Pt discharged via wheelchair. Reviewed d/c instructions with pt.  All questions and concerns addressed. No complaints at this time.

## 2019-11-22 NOTE — Discharge Instructions (Signed)
Use ice and tylenol for pain. Follow up as directed with the clinic this coming Thursday. Return to the ER if your symptoms get worse before that time.

## 2019-11-24 ENCOUNTER — Telehealth: Payer: Self-pay | Admitting: *Deleted

## 2019-11-24 ENCOUNTER — Ambulatory Visit (INDEPENDENT_AMBULATORY_CARE_PROVIDER_SITE_OTHER): Payer: Medicare Other | Admitting: Licensed Clinical Social Worker

## 2019-11-24 DIAGNOSIS — F410 Panic disorder [episodic paroxysmal anxiety] without agoraphobia: Secondary | ICD-10-CM

## 2019-11-24 DIAGNOSIS — F331 Major depressive disorder, recurrent, moderate: Secondary | ICD-10-CM

## 2019-11-24 DIAGNOSIS — F4312 Post-traumatic stress disorder, chronic: Secondary | ICD-10-CM

## 2019-11-24 DIAGNOSIS — F411 Generalized anxiety disorder: Secondary | ICD-10-CM

## 2019-11-24 NOTE — Progress Notes (Signed)
Virtual Visit via Telephone Note  I connected with Lindsay [name] on 11/24/19 at 11:00 AM EDT by telephone and verified that I am speaking with the correct person using two identifiers.  Location: Patient: home Provider: home office   I discussed the limitations, risks, security and privacy concerns of performing an evaluation and management service by telephone and the availability of in person appointments. I also discussed with the patient that there may be a patient responsible charge related to this service. The patient expressed understanding and agreed to proceed.   I discussed the assessment and treatment plan with the patient. The patient was provided an opportunity to ask questions and all were answered. The patient agreed with the plan and demonstrated an understanding of the instructions.   The patient was advised to call back or seek an in-person evaluation if the symptoms worsen or if the condition fails to improve as anticipated.  I provided 40 minutes of non-face-to-face time during this encounter.  THERAPIST PROGRESS NOTE  Session Time: 11:00 AM to 11:40 AM  Participation Level: Active  Behavioral Response: CasualAlertAnxious  Type of Therapy: Individual Therapy  Treatment Goals addressed:  help cope with health issues, anxiety, triggers, coping  Interventions: Solution Focused, Strength-based, Reframing and Other: coping  Summary: Lindsay [name] is a 49 y.o. female who presents with fell on Saturday. Was bruised on her side, significantly hurt her knee, went to ER on Monday and found out congestive heart failure fluid around heart and lungs. Trying to think positive but feels like her life is over. Has follow up and thinks they will give a fluid pill until see cardiologist. Mom has it so scary for her. Pulmonary doctor and gave her medicine for short of breath, inhaler but not helping. Will help to have less fluid in her body. Can't afford for her to another medical  issues. Has COPD and chronic lung failure. Still dealing with pain issue. Didn't realize that there was so much fluid in her body. Still trying to wrap her mind around it and grasp it with news of CHF so much going on and another serious issue going on. Talking to therapist is part of the grasping. Talk to someone about the situation.  We will keep appointment on Friday after visit from Dr. And if she still needs to process through her feelings  Suicidal/Homicidal: No  Therapist Response: Therapist reviewed symptoms, facilitated expression of thoughts and feelings and utilize reframing as a coping strategy even though patient has learned about a serious medical condition, doctors may finally be getting to the source of her issues, can be effectively treated for relief of symptoms that is important such as difficulty in breathing.  Therapist guided patient and listening to expert advice of Dr. On not letting her own fearful thoughts fill in the pieces but getting accurate information of how to manage her medical issues.  Also shared with patient happy to see she has an appointment with Dr. Marylene Buerger.  Discussed how this will help her get on track with her medical issues such as priority taking a fluid pill.  Provided broader picture of recognition of finality of life applies for everybody important for people to realize this so not to take life for granted so patient is not alone in facing that reality but perspective in how a person decides to live with that reality is important.  Therapist provided active listening, open questions supportive interventions  Plan: Return again in 2 days. 2.Therapist work with patient on  depression, anxiety, processing through feelings related to past, managing health issues  Diagnosis: Axis I: Major depressive disorder, recurrent, moderate, generalized anxiety disorder, chronic PTSD, panic attacks   Axis II: No diagnosis    Lindsay Register, LCSW 11/24/2019

## 2019-11-24 NOTE — Telephone Encounter (Signed)
Call from pt - stated she felled on Saturday, went to the ED on Monday. She was eval for her breathing; she stated they did not address her fall. She was dx with CHF. She has an appt tomorrow w/Dr Truman Hayward on Thursday for ED f/u but county transportation told her they will be unable to bring her even she called on Monday. I asked Doris if we could provide transportation which we will; pt informed to have her phone with her when they call and be able to text. Instructed pt to call 911 if her breathing worsens; voiced understanding.

## 2019-11-25 ENCOUNTER — Encounter (HOSPITAL_COMMUNITY): Payer: Self-pay | Admitting: Internal Medicine

## 2019-11-25 ENCOUNTER — Inpatient Hospital Stay (HOSPITAL_COMMUNITY)
Admission: AD | Admit: 2019-11-25 | Discharge: 2019-11-29 | DRG: 291 | Disposition: A | Discharge status: Home Health | Payer: Medicare Other | Attending: Internal Medicine | Admitting: Internal Medicine

## 2019-11-25 ENCOUNTER — Encounter: Payer: Self-pay | Admitting: Internal Medicine

## 2019-11-25 ENCOUNTER — Other Ambulatory Visit: Payer: Self-pay

## 2019-11-25 ENCOUNTER — Other Ambulatory Visit: Payer: Self-pay | Admitting: Internal Medicine

## 2019-11-25 ENCOUNTER — Ambulatory Visit (INDEPENDENT_AMBULATORY_CARE_PROVIDER_SITE_OTHER): Payer: Medicare Other | Admitting: Internal Medicine

## 2019-11-25 VITALS — BP 122/83 | HR 122 | Temp 98.1°F | Ht 71.0 in | Wt >= 6400 oz

## 2019-11-25 DIAGNOSIS — N179 Acute kidney failure, unspecified: Secondary | ICD-10-CM | POA: Diagnosis not present

## 2019-11-25 DIAGNOSIS — J9601 Acute respiratory failure with hypoxia: Secondary | ICD-10-CM

## 2019-11-25 DIAGNOSIS — Z79899 Other long term (current) drug therapy: Secondary | ICD-10-CM

## 2019-11-25 DIAGNOSIS — Z9981 Dependence on supplemental oxygen: Secondary | ICD-10-CM

## 2019-11-25 DIAGNOSIS — Z20822 Contact with and (suspected) exposure to covid-19: Secondary | ICD-10-CM | POA: Diagnosis present

## 2019-11-25 DIAGNOSIS — Z888 Allergy status to other drugs, medicaments and biological substances status: Secondary | ICD-10-CM

## 2019-11-25 DIAGNOSIS — J449 Chronic obstructive pulmonary disease, unspecified: Secondary | ICD-10-CM | POA: Diagnosis present

## 2019-11-25 DIAGNOSIS — M19011 Primary osteoarthritis, right shoulder: Secondary | ICD-10-CM | POA: Diagnosis not present

## 2019-11-25 DIAGNOSIS — J9621 Acute and chronic respiratory failure with hypoxia: Secondary | ICD-10-CM | POA: Diagnosis not present

## 2019-11-25 DIAGNOSIS — I509 Heart failure, unspecified: Secondary | ICD-10-CM

## 2019-11-25 DIAGNOSIS — M797 Fibromyalgia: Secondary | ICD-10-CM

## 2019-11-25 DIAGNOSIS — Z791 Long term (current) use of non-steroidal anti-inflammatories (NSAID): Secondary | ICD-10-CM

## 2019-11-25 DIAGNOSIS — W2209XA Striking against other stationary object, initial encounter: Secondary | ICD-10-CM | POA: Diagnosis not present

## 2019-11-25 DIAGNOSIS — S3663XA Laceration of rectum, initial encounter: Secondary | ICD-10-CM | POA: Diagnosis not present

## 2019-11-25 DIAGNOSIS — I517 Cardiomegaly: Secondary | ICD-10-CM | POA: Diagnosis not present

## 2019-11-25 DIAGNOSIS — Z87891 Personal history of nicotine dependence: Secondary | ICD-10-CM | POA: Diagnosis not present

## 2019-11-25 DIAGNOSIS — N049 Nephrotic syndrome with unspecified morphologic changes: Secondary | ICD-10-CM | POA: Diagnosis not present

## 2019-11-25 DIAGNOSIS — Z23 Encounter for immunization: Secondary | ICD-10-CM | POA: Diagnosis not present

## 2019-11-25 DIAGNOSIS — Z9181 History of falling: Secondary | ICD-10-CM

## 2019-11-25 DIAGNOSIS — I110 Hypertensive heart disease with heart failure: Principal | ICD-10-CM | POA: Diagnosis present

## 2019-11-25 DIAGNOSIS — F419 Anxiety disorder, unspecified: Secondary | ICD-10-CM | POA: Diagnosis present

## 2019-11-25 DIAGNOSIS — F411 Generalized anxiety disorder: Secondary | ICD-10-CM | POA: Diagnosis not present

## 2019-11-25 DIAGNOSIS — I5023 Acute on chronic systolic (congestive) heart failure: Secondary | ICD-10-CM | POA: Diagnosis not present

## 2019-11-25 DIAGNOSIS — Z6841 Body Mass Index (BMI) 40.0 and over, adult: Secondary | ICD-10-CM | POA: Diagnosis not present

## 2019-11-25 DIAGNOSIS — F4312 Post-traumatic stress disorder, chronic: Secondary | ICD-10-CM | POA: Diagnosis not present

## 2019-11-25 DIAGNOSIS — K1370 Unspecified lesions of oral mucosa: Secondary | ICD-10-CM

## 2019-11-25 DIAGNOSIS — W19XXXA Unspecified fall, initial encounter: Secondary | ICD-10-CM | POA: Diagnosis not present

## 2019-11-25 DIAGNOSIS — F410 Panic disorder [episodic paroxysmal anxiety] without agoraphobia: Secondary | ICD-10-CM | POA: Diagnosis present

## 2019-11-25 DIAGNOSIS — K219 Gastro-esophageal reflux disease without esophagitis: Secondary | ICD-10-CM | POA: Diagnosis present

## 2019-11-25 DIAGNOSIS — E662 Morbid (severe) obesity with alveolar hypoventilation: Secondary | ICD-10-CM | POA: Diagnosis present

## 2019-11-25 DIAGNOSIS — G629 Polyneuropathy, unspecified: Secondary | ICD-10-CM | POA: Diagnosis not present

## 2019-11-25 DIAGNOSIS — K589 Irritable bowel syndrome without diarrhea: Secondary | ICD-10-CM | POA: Diagnosis not present

## 2019-11-25 DIAGNOSIS — G43909 Migraine, unspecified, not intractable, without status migrainosus: Secondary | ICD-10-CM | POA: Diagnosis not present

## 2019-11-25 DIAGNOSIS — Z7951 Long term (current) use of inhaled steroids: Secondary | ICD-10-CM

## 2019-11-25 DIAGNOSIS — F32A Depression, unspecified: Secondary | ICD-10-CM | POA: Diagnosis not present

## 2019-11-25 DIAGNOSIS — R0602 Shortness of breath: Secondary | ICD-10-CM

## 2019-11-25 DIAGNOSIS — J9620 Acute and chronic respiratory failure, unspecified whether with hypoxia or hypercapnia: Secondary | ICD-10-CM | POA: Diagnosis not present

## 2019-11-25 DIAGNOSIS — F331 Major depressive disorder, recurrent, moderate: Secondary | ICD-10-CM | POA: Diagnosis not present

## 2019-11-25 DIAGNOSIS — Y92231 Patient bathroom in hospital as the place of occurrence of the external cause: Secondary | ICD-10-CM | POA: Diagnosis not present

## 2019-11-25 DIAGNOSIS — I5033 Acute on chronic diastolic (congestive) heart failure: Secondary | ICD-10-CM | POA: Diagnosis not present

## 2019-11-25 DIAGNOSIS — R000 Tachycardia, unspecified: Secondary | ICD-10-CM | POA: Diagnosis not present

## 2019-11-25 DIAGNOSIS — Z818 Family history of other mental and behavioral disorders: Secondary | ICD-10-CM

## 2019-11-25 DIAGNOSIS — Z8249 Family history of ischemic heart disease and other diseases of the circulatory system: Secondary | ICD-10-CM

## 2019-11-25 HISTORY — DX: Heart failure, unspecified: I50.9

## 2019-11-25 LAB — CBC WITH DIFFERENTIAL/PLATELET
Abs Immature Granulocytes: 0.1 10*3/uL — ABNORMAL HIGH (ref 0.00–0.07)
Basophils Absolute: 0.1 10*3/uL (ref 0.0–0.1)
Basophils Relative: 1 %
Eosinophils Absolute: 0.1 10*3/uL (ref 0.0–0.5)
Eosinophils Relative: 1 %
HCT: 43.2 % (ref 36.0–46.0)
Hemoglobin: 13.6 g/dL (ref 12.0–15.0)
Immature Granulocytes: 1 %
Lymphocytes Relative: 29 %
Lymphs Abs: 2.4 10*3/uL (ref 0.7–4.0)
MCH: 26.5 pg (ref 26.0–34.0)
MCHC: 31.5 g/dL (ref 30.0–36.0)
MCV: 84 fL (ref 80.0–100.0)
Monocytes Absolute: 1.1 10*3/uL — ABNORMAL HIGH (ref 0.1–1.0)
Monocytes Relative: 13 %
Neutro Abs: 4.6 10*3/uL (ref 1.7–7.7)
Neutrophils Relative %: 55 %
Platelets: 354 10*3/uL (ref 150–400)
RBC: 5.14 MIL/uL — ABNORMAL HIGH (ref 3.87–5.11)
RDW: 18.8 % — ABNORMAL HIGH (ref 11.5–15.5)
WBC: 8.3 10*3/uL (ref 4.0–10.5)
nRBC: 0 % (ref 0.0–0.2)

## 2019-11-25 LAB — BASIC METABOLIC PANEL
Anion gap: 12 (ref 5–15)
Anion gap: 11 (ref 5–15)
BUN: 11 mg/dL (ref 6–20)
BUN: 11 mg/dL (ref 6–20)
CO2: 24 mmol/L (ref 22–32)
CO2: 24 mmol/L (ref 22–32)
Calcium: 9.3 mg/dL (ref 8.9–10.3)
Calcium: 9.5 mg/dL (ref 8.9–10.3)
Chloride: 103 mmol/L (ref 98–111)
Chloride: 105 mmol/L (ref 98–111)
Creatinine, Ser: 1.16 mg/dL — ABNORMAL HIGH (ref 0.44–1.00)
Creatinine, Ser: 1.26 mg/dL — ABNORMAL HIGH (ref 0.44–1.00)
GFR, Estimated: 58 mL/min — ABNORMAL LOW (ref >60)
GFR, Estimated: 52 mL/min — ABNORMAL LOW (ref >60)
Glucose, Bld: 148 mg/dL — ABNORMAL HIGH (ref 70–99)
Glucose, Bld: 88 mg/dL (ref 70–99)
Potassium: 3.9 mmol/L (ref 3.5–5.1)
Potassium: 3.7 mmol/L (ref 3.5–5.1)
Sodium: 139 mmol/L (ref 135–145)
Sodium: 140 mmol/L (ref 135–145)

## 2019-11-25 LAB — URINALYSIS, ROUTINE W REFLEX MICROSCOPIC
Bilirubin Urine: NEGATIVE
Glucose, UA: NEGATIVE mg/dL
Ketones, ur: NEGATIVE mg/dL
Leukocytes,Ua: NEGATIVE
Nitrite: NEGATIVE
Protein, ur: NEGATIVE mg/dL
Specific Gravity, Urine: 1.009 (ref 1.005–1.030)
pH: 5 (ref 5.0–8.0)

## 2019-11-25 LAB — CBC
HCT: 42.8 % (ref 36.0–46.0)
Hemoglobin: 13.4 g/dL (ref 12.0–15.0)
MCH: 26.6 pg (ref 26.0–34.0)
MCHC: 31.3 g/dL (ref 30.0–36.0)
MCV: 84.9 fL (ref 80.0–100.0)
Platelets: 443 10*3/uL — ABNORMAL HIGH (ref 150–400)
RBC: 5.04 MIL/uL (ref 3.87–5.11)
RDW: 19.1 % — ABNORMAL HIGH (ref 11.5–15.5)
WBC: 9.6 10*3/uL (ref 4.0–10.5)
nRBC: 0 % (ref 0.0–0.2)

## 2019-11-25 LAB — BRAIN NATRIURETIC PEPTIDE
B Natriuretic Peptide: 21.9 pg/mL (ref 0.0–100.0)
B Natriuretic Peptide: 34.8 pg/mL (ref 0.0–100.0)

## 2019-11-25 MED ORDER — INFLUENZA VAC SPLIT QUAD 0.5 ML IM SUSY
0.5000 mL | PREFILLED_SYRINGE | INTRAMUSCULAR | Status: AC
  Administered 2019-11-26: 0.5 mL via INTRAMUSCULAR
  Filled 2019-11-25: qty 0.5

## 2019-11-25 MED ORDER — LORATADINE 10 MG PO TABS
10.0000 mg | ORAL_TABLET | Freq: Every day | ORAL | Status: DC
  Administered 2019-11-26 – 2019-11-29 (×4): 10 mg via ORAL
  Filled 2019-11-25 (×4): qty 1

## 2019-11-25 MED ORDER — CLORAZEPATE DIPOTASSIUM 3.75 MG PO TABS: 3.7500 mg | ORAL_TABLET | Freq: Every day | ORAL | Status: DC

## 2019-11-25 MED ORDER — HYDROXYZINE HCL 50 MG PO TABS
50.0000 mg | ORAL_TABLET | Freq: Every evening | ORAL | Status: DC | PRN
  Administered 2019-11-25 – 2019-11-28 (×4): 50 mg via ORAL
  Filled 2019-11-25 (×6): qty 1

## 2019-11-25 MED ORDER — ENOXAPARIN SODIUM 40 MG/0.4ML ~~LOC~~ SOLN
40.0000 mg | SUBCUTANEOUS | Status: DC
  Administered 2019-11-25 – 2019-11-28 (×4): 40 mg via SUBCUTANEOUS
  Filled 2019-11-25 (×4): qty 0.4

## 2019-11-25 MED ORDER — PANTOPRAZOLE SODIUM 40 MG PO TBEC
40.0000 mg | DELAYED_RELEASE_TABLET | Freq: Every day | ORAL | Status: DC
  Administered 2019-11-26 – 2019-11-29 (×4): 40 mg via ORAL
  Filled 2019-11-25 (×4): qty 1

## 2019-11-25 MED ORDER — CLORAZEPATE DIPOTASSIUM 3.75 MG PO TABS
3.7500 mg | ORAL_TABLET | Freq: Every day | ORAL | Status: DC
  Administered 2019-11-26 – 2019-11-29 (×4): 3.75 mg via ORAL
  Filled 2019-11-25 (×4): qty 1

## 2019-11-25 MED ORDER — SODIUM CHLORIDE 0.9 % IV SOLN: 250.0000 mL | INTRAVENOUS | Status: DC | PRN

## 2019-11-25 MED ORDER — VITAMIN B-12 1000 MCG PO TABS
1000.0000 ug | ORAL_TABLET | Freq: Every day | ORAL | Status: DC
  Administered 2019-11-26 – 2019-11-29 (×4): 1000 ug via ORAL
  Filled 2019-11-25 (×4): qty 1

## 2019-11-25 MED ORDER — BENZOCAINE (TOPICAL) 20 % EX OINT: 1.0000 "application " | TOPICAL_OINTMENT | CUTANEOUS | Status: DC | PRN

## 2019-11-25 MED ORDER — CAPSAICIN HEAT PATCH 0.025 % EX PADS: 1.0000 | MEDICATED_PAD | Freq: Three times a day (TID) | CUTANEOUS | Status: DC | PRN

## 2019-11-25 MED ORDER — MOMETASONE FURO-FORMOTEROL FUM 200-5 MCG/ACT IN AERO
2.0000 | INHALATION_SPRAY | Freq: Two times a day (BID) | RESPIRATORY_TRACT | Status: DC
  Administered 2019-11-25 – 2019-11-29 (×8): 2 via RESPIRATORY_TRACT
  Filled 2019-11-25: qty 8.8

## 2019-11-25 MED ORDER — PROMETHAZINE HCL 12.5 MG PO TABS
12.5000 mg | ORAL_TABLET | Freq: Four times a day (QID) | ORAL | Status: DC | PRN
  Administered 2019-11-26 – 2019-11-29 (×5): 12.5 mg via ORAL
  Filled 2019-11-25 (×6): qty 1

## 2019-11-25 MED ORDER — FLUTICASONE PROPIONATE 50 MCG/ACT NA SUSP: 1.0000 | Freq: Every day | NASAL | Status: DC | PRN

## 2019-11-25 MED ORDER — LAMOTRIGINE 25 MG PO TABS
150.0000 mg | ORAL_TABLET | Freq: Every day | ORAL | Status: DC
  Administered 2019-11-26 – 2019-11-29 (×4): 150 mg via ORAL
  Filled 2019-11-25 (×4): qty 6

## 2019-11-25 MED ORDER — ALBUTEROL SULFATE (2.5 MG/3ML) 0.083% IN NEBU
2.5000 mg | INHALATION_SOLUTION | Freq: Four times a day (QID) | RESPIRATORY_TRACT | Status: DC
  Administered 2019-11-26 – 2019-11-27 (×3): 2.5 mg via RESPIRATORY_TRACT
  Filled 2019-11-25 (×5): qty 3

## 2019-11-25 MED ORDER — VITAMIN D 25 MCG (1000 UNIT) PO TABS
1000.0000 [IU] | ORAL_TABLET | Freq: Every day | ORAL | Status: DC
  Administered 2019-11-26 – 2019-11-29 (×4): 1000 [IU] via ORAL
  Filled 2019-11-25 (×5): qty 1

## 2019-11-25 MED ORDER — ALBUTEROL SULFATE (2.5 MG/3ML) 0.083% IN NEBU
2.5000 mg | INHALATION_SOLUTION | RESPIRATORY_TRACT | Status: DC | PRN
  Administered 2019-11-27: 2.5 mg via RESPIRATORY_TRACT

## 2019-11-25 MED ORDER — ACETAMINOPHEN 325 MG PO TABS
650.0000 mg | ORAL_TABLET | ORAL | Status: DC | PRN
  Administered 2019-11-25 – 2019-11-27 (×3): 650 mg via ORAL
  Filled 2019-11-25 (×3): qty 2

## 2019-11-25 MED ORDER — PNEUMOCOCCAL VAC POLYVALENT 25 MCG/0.5ML IJ INJ
0.5000 mL | INJECTION | INTRAMUSCULAR | Status: AC
  Administered 2019-11-26: 0.5 mL via INTRAMUSCULAR
  Filled 2019-11-25: qty 0.5

## 2019-11-25 MED ORDER — ALBUTEROL SULFATE (2.5 MG/3ML) 0.083% IN NEBU
2.5000 mg | INHALATION_SOLUTION | RESPIRATORY_TRACT | Status: DC
  Administered 2019-11-25 (×2): 2.5 mg via RESPIRATORY_TRACT
  Filled 2019-11-25 (×2): qty 3

## 2019-11-25 MED ORDER — ALBUTEROL SULFATE HFA 108 (90 BASE) MCG/ACT IN AERS: 2.0000 | INHALATION_SPRAY | Freq: Four times a day (QID) | RESPIRATORY_TRACT | Status: DC | PRN

## 2019-11-25 MED ORDER — BREXPIPRAZOLE 1 MG PO TABS
3.0000 mg | ORAL_TABLET | Freq: Every day | ORAL | Status: DC
  Administered 2019-11-26 – 2019-11-29 (×4): 3 mg via ORAL
  Filled 2019-11-25 (×4): qty 3

## 2019-11-25 MED ORDER — FAMOTIDINE 20 MG PO TABS
20.0000 mg | ORAL_TABLET | Freq: Two times a day (BID) | ORAL | Status: DC
  Administered 2019-11-25 – 2019-11-29 (×8): 20 mg via ORAL
  Filled 2019-11-25 (×8): qty 1

## 2019-11-25 MED ORDER — SODIUM CHLORIDE 0.9% FLUSH
3.0000 mL | Freq: Two times a day (BID) | INTRAVENOUS | Status: DC
  Administered 2019-11-25 – 2019-11-29 (×7): 3 mL via INTRAVENOUS

## 2019-11-25 MED ORDER — FUROSEMIDE 40 MG PO TABS: 40.0000 mg | ORAL_TABLET | Freq: Two times a day (BID) | ORAL | 0 refills | Status: DC

## 2019-11-25 MED ORDER — FUROSEMIDE 10 MG/ML IJ SOLN
40.0000 mg | Freq: Two times a day (BID) | INTRAMUSCULAR | Status: DC
  Administered 2019-11-25 – 2019-11-26 (×2): 40 mg via INTRAVENOUS
  Filled 2019-11-25 (×2): qty 4

## 2019-11-25 MED ORDER — SODIUM CHLORIDE 0.9% FLUSH: 3.0000 mL | INTRAVENOUS | Status: DC | PRN

## 2019-11-25 NOTE — Assessment & Plan Note (Signed)
On chart review, noted to be taking significant amount of NSAIDs for pain. May be contributing to cardiomyopathy, worsening renal fx. Advised on avoiding NSAID use with chf. Pending referral for pain clinic but has been refused by all locations for [facility]

## 2019-11-25 NOTE — Assessment & Plan Note (Signed)
Previously seen by [facility] but became lost to follow up due to multiple no-shows. Last echo in 03/2019 with global hypokinesis with EF 45-50%. No prior ischemic work-up.  See under 'acute respiratory failure' for further details

## 2019-11-25 NOTE — Progress Notes (Addendum)
Direct admit from home. Vitals done. Attending MD notified of patient arrival. IV team paged. Patient uses [place] home oxygen. Arrived on RA sats 96% c/o SOB on exertion.Tachy cardia 110-115 with yellow [place].MD made aware.  Patient requested oxygen, placed on 3L per request.

## 2019-11-25 NOTE — H&P (Addendum)
Date: 11/25/2019               Patient Name:  Lindsay [name] MRN: [id number]  DOB: 10-May-1970 Age / Sex: 49 y.o., female   PCP: Lindsay Ochs, MD         Medical Service: Internal Medicine Teaching Service         Attending Physician: Dr. Lucious Groves, DO    First Contact: Dr. Johnney [name] Pager: [phone]  Second Contact: Dr. Laural [name] Pager: [phone]       After Hours (After 5p/  First Contact Pager: [phone]  weekends / holidays): Second Contact Pager: [phone]   Chief Complaint: Shortness of breath  History of Present Illness:   [name] is a 49 yo female with a PMHx of HFrEF, obesity hypoventiliation syndrome, obstructive sleep apnea, MDD, fibromyalgia, HTN and morbid obesity presenting with shortness of breath for the past month. She was seen by her pulmonologist a month ago who prescribed her doxycycline and prednisone, assuming the patient was having a COPD exacerbation. Her symptoms persisted and she came to the ED Monday after having worsening shortness of breath and having a mechanical fall. She endorses tripping over her sandals and landing on her knees. She denies loss of consciousness nor hitting her head. She endorses bilateral knee pain, neck pain, back pain. She stated she received multiple imaging studies and was given IV lasix which gave her mild relief of her shortness of breath. She followed up with the Internal Medicine Clinic for a post ED visit.   During her visit at the clinic the patient presented with acute on chronic hypoxic respiratory failure. She was found to be fluid overloaded saturating 100% on 5L nasal cannula.   Patient denies history of heart failure and states she is on oxygen for her COPD. She endorses wearing a holster monitor 2x for a suspected arrhythmia because she felt fluttering in her chest. Denies history of afib. Denies history of MI.  No history of MI. Denies chest pain. Has noticed swelling in her legs and ankles. Denies  blood in stool or urine.  Denies fever, chills, chest pain, abominable pain, vomiting, nausea, or difficulty passing her bowels/urinating. She endorses her stomach feeling bloated and bilateral lower extremity edema.   She has no other complaints or concerns at the time of my examination.   Meds:  Current Facility-Administered Medications for the 11/25/19 encounter [facility] Encounter)  Medication  . ferumoxytol [facility]) injection 510 mg   No outpatient medications have been marked as taking for the 11/25/19 encounter [facility] Encounter).   Allergies: Allergies as of 11/25/2019 - Review Complete 11/25/2019  Allergen Reaction Noted  . Lisinopril Rash and Cough 11/06/2015   Past Medical History:  Diagnosis Date  . Acid reflux   . Anemia    Iron Def  . Anorexia   . CHF (congestive heart failure) ([place])   . Chronic kidney disease    Nephrotic syndrome  . Colon polyp 2009  . Depression with anxiety   . Edema leg   . Fibromyalgia   . Hemorrhoids   . Hidradenitis suppurativa   . Hypertension   . IBS (irritable bowel syndrome)   . Low back pain   . Migraines   . Morbidly obese ([place])   . Neuromuscular disorder (HCC)    fibromyalgia  . Neuropathy   . Panic attacks   . Polyp of vocal cord or larynx   . Tonsil pain     Family History:  Mother- heart failure Father- died of prostate cancer, cardiac hx (unsure specifics) Siblings-healthy  Social History:  Quit smoking in February 2021. Smoked for 22 years, a pack a day. No alcohol. Uses BC powder, 4 packs a day.   Review of Systems: A complete ROS was negative except as per HPI.  Physical Exam: Blood pressure 131/88, pulse (!) 101, temperature 98.7 F (37.1 C), resp. rate 20, height 5\' 11"  (1.803 m), weight (!) 183.6 kg, last menstrual period 11/17/2019, SpO2 98 %. Physical Exam Vitals reviewed.  Constitutional:      General: She is not in acute distress.    Appearance: She is not ill-appearing, toxic-appearing or  diaphoretic.  HENT:     Mouth/Throat:     Mouth: Mucous membranes are moist.     Comments: Growth in mouth, behind lower teeth.  Cardiovascular:     Rate and Rhythm: Regular rhythm. Tachycardia present.     Pulses: Normal pulses.     Heart sounds: Normal heart sounds. No murmur heard.  No friction rub. No gallop.      Comments: Unable to assess JVD due to body habitus Pulmonary:     Effort: No respiratory distress.     Breath sounds: No stridor. Wheezing present. No rhonchi or rales.     Comments: Decreased breath sounds diffusely Chest:     Chest wall: No tenderness.  Abdominal:     Tenderness: There is no abdominal tenderness. There is no guarding or rebound.     Comments: Difficult to obtain accurate assessment of distension due to body habitus.   Musculoskeletal:     Cervical back: No tenderness.     Right lower leg: Edema (2+) present.     Left lower leg: Edema (2+) present.  Neurological:     General: No focal deficit present.     Mental Status: She is alert and oriented to person, place, and time. Mental status is at baseline.  Psychiatric:        Mood and Affect: Mood normal.        Behavior: Behavior normal.    EKG: personally reviewed my interpretation is 100 bpm, sinus tachycarida. Normal axis. Normal PR and QT intervals. No st wave abnormalities. Compared to prior on 05/03/2019, no changes.  CXR 11/22/19  IMPRESSION: Suspect mild congestive heart failure.  Echocardiogram 03/2019 FINDINGS  Left Ventricle: Left ventricular ejection fraction, by visual estimation,  is 45 to 50%. The left ventricle has mildly decreased function. The left  ventricle demonstrates global hypokinesis. The left ventricular internal  cavity size was mildly to  moderately dilated left ventricle. There is mildly increased left  ventricular hypertrophy. Asymmetric left ventricular hypertrophy of the  posterior wall. Left ventricular diastolic parameters are indeterminate.    Assessment  & Plan by Problem: Active Problems:   Acute on chronic heart failure (HCC)  Lindsay [name] is a 49 y.o. with pertinent PMH of HFrEF, nephrotic syndrome, obesity hypoventiliation syndrome, obstructive sleep apnea, MDD, fibromyalgia, HTN and morbid obesity who presented with shortness of breath and lower extremity edema, and admitted for dyspnea on hospital day 0   Dyspnea Patient with worsening shortness of breath at rest for the past month. She appears to be volume up on examination with lower extremity swelling, unable to assess for JVD or abdominal distension due to body habitus. She has not required increase in her supplemental oxygen, has been saturating above 90% with 3L [place]. Patient prescribed 5L at home for COPD. Her dyspnea improved with lasix  during her ED visit and she was found to have mild bilateral pleural effusions on x-ray. Her BNP was unremarkable during ED visit and today. Patient with history of HFrEF,  Echo 03/2019- EF of 45-50%, left ventricle mildly decreased function with global hypokinesis. Patient also with history of nephrotic syndrome, will add urinalysis to assess for protein loss. Low suspicion for ischemic etiology or arrhythmia as reason for potential CHF exacerbation, EKG unremarkable aside from tachycardia. Also do not suspect myocarditis or other infectious etiology at this time. Do not suspect copd exacerbation at this time, dyspnea did not improve with doxycycline and prednisone course on 11/08/19.  -40 mg lasix BID daily -echo pending -urinalysis pending -strict I/O's -daily weights  Acute Kidney Injury Cr of 1.26. Baseline <1. BUN/creatinine ratio of 8. Intrarenal in nature. Patient with history of chronic BC powder use. Also history of nephrotic syndrome.  -urinalysis pending -repeat CMP tomorrow -hold nephrotoxic medications  COPD History of COPD, last PFT's 02/09/2018, revealed moderately severe obstructive airways disease. On home oxygen, 5L nasal cannula.   -continue oxygen supplementation, currently 3L -continue home albuterol, dulera.  Mouth Growth Patient with chronic mouth growth that has been increasing in size per patient. She endorses white liquid coming out of area. Was prescribed antibiotics in the past and told related to gingivitis. Unsure if neoplastic in etiology.  -consider consulting dentistry while admitted  Hx of GERD Followed by GI on outpatient basis -continue home protonix and famotidine  Mechanical Fall Patient with recent mechanical fall. Imaging performed during ED visit 4 days ago, negative for acute process. Negative bilateral knee x-rays. Patient with no cervical or spinal tenderness upon examination.  -tylenol for pain -continue to monitor, consider further imaging if not improving  Fibromyalgia Continue home medications, lamictal  Diet: Carb-Modified VTE: Enoxaparin IVF: None,None Code: Full  Prior to Admission Living Arrangement: Home Anticipated Discharge Location: Home  Dispo: Admit patient to Inpatient with expected length of stay greater than 2 midnights.  Signed: Riesa Pope, MD Internal Medicine Resident PGY-1 Pager: [phone]  11/25/2019, 7:21 PM

## 2019-11-25 NOTE — Progress Notes (Signed)
Patient refused CPAP for the night  

## 2019-11-25 NOTE — Progress Notes (Addendum)
CC: Shortness of breath  HPI: Ms.Jaylaa Heitman is a 49 y.o. with PMH listed below presenting with complaint of shortness of breath. Please see problem based assessment and plan for further details.  Past Medical History:  Diagnosis Date  . Acid reflux   . Anemia    Iron Def  . Anorexia   . Chronic kidney disease    Nephrotic syndrome  . Colon polyp 2009  . Depression with anxiety   . Edema leg   . Fibromyalgia   . Hemorrhoids   . Hidradenitis suppurativa   . Hypertension   . IBS (irritable bowel syndrome)   . Low back pain   . Migraines   . Morbidly obese ([place])   . Neuromuscular disorder (HCC)    fibromyalgia  . Neuropathy   . Panic attacks   . Polyp of vocal cord or larynx   . Tonsil pain    Review of Systems: Review of Systems  Constitutional: Negative for chills, fever and malaise/fatigue.  Eyes: Negative for blurred vision.  Respiratory: Negative for cough and shortness of breath.   Cardiovascular: Negative for chest pain, palpitations and leg swelling.  Gastrointestinal: Negative for constipation, diarrhea, nausea and vomiting.  Genitourinary: Negative for dysuria, frequency and urgency.  Musculoskeletal: Positive for back pain, falls, joint pain, myalgias and neck pain.    Physical Exam: Vitals:   11/25/19 1017 11/25/19 1026  BP: 122/83   Pulse: (!) 122   Temp: 98.1 F (36.7 C)   SpO2: 100% 99%  Weight: (!) 406 lb 8 oz (184.4 kg)   Height: 5\' 11"  (1.803 m)    Gen: Well-developed, morbidly obese, NAD HEENT: NCAT head, hearing intact, unable to assess JVD due to habitus CV: RRR, S1, S2 normal Pulm: Bibasilar rales, no wheezes Abd: BS+, NT, Distended with abdominal wall edema Extm: ROM intact, Peripheral pulses intact, 2+ pitting edema up to thighs Skin: Dry, Warm, normal turgor, no wounds, no rashes, no lesions  Assessment & Plan:   Acute respiratory failure (HCC) Ms.Schellinger is a 49 yo F w/ PMH of OSA, OHS, HFmrEF, MDD, Fibromyalgia and morbid  obesity presenting to [facility] for ED follow up visit. She was in her usual state of health until 2 weeks ago when she began to have shortness of breath without any inciting event. She spoke with her pulmonologist, Dr.Olarere, who prescribed her a short course of prednisone and doxycycline without significant improvement in her symptoms. She went to ED for further evaluation 3 days prior after feeling light-headed and was found to have pulmonary edema. She was given a dose of IV furosemide and discharged with recommendation to follow up with PCP. She mentions that she only takes hydrochlorothiazide for her diuretics.  A/P Present with acute on chronic hypoxic respiratory failure for follow up after ED visit 3 days prior. BNP wnl 3 days prior but chest X-ray with pulmonary edema. +40 pound weigt gain since last clinic visit with significant edema and rales on exam. Currently on 5L [place] and satting 100%. Likely due to inadequate diuretic regimen as she is not on furosemide or torsemide at home. Would be a great candidate for heart failure at home program but too tachycardic, hypoxic to be safely sent home. Will need admission for inpatient monitoring and IV diuresis.  - Admit to telemetry (Patient requests to wait for bed to open up at home) - discussed red-flag symptoms and to call EMS emergently if worsening hypoxia / new chest pain - Advised that due to  pandemic, next bed may not be available until tomorrow. Will send po furosemide to home pharmacy to start treatment while awaiting bed placement - BMP, cbc, bnp  Fibromyalgia On chart review, noted to be taking significant amount of NSAIDs for pain. May be contributing to cardiomyopathy, worsening renal fx. Advised on avoiding NSAID use with chf. Pending referral for pain clinic but has been refused by all locations for [facility]  Acute on chronic systolic heart failure ([place]) Previously seen by Dr.Crenshaw but became lost to follow up due to multiple  no-shows. Last echo in 03/2019 with global hypokinesis with EF 45-50%. No prior ischemic work-up.  See under 'acute respiratory failure' for further details   Patient discussed with Dr. Daryll Drown  -Gilberto Better, [place] [facility] Pager: [phone]

## 2019-11-25 NOTE — Assessment & Plan Note (Addendum)
Lindsay [name] is a 49 yo F w/ PMH of OSA, OHS, HFmrEF, MDD, Fibromyalgia and morbid obesity presenting to [facility] for ED follow up visit. She was in her usual state of health until 2 weeks ago when she began to have shortness of breath without any inciting event. She spoke with her pulmonologist, Dr.Olarere, who prescribed her a short course of prednisone and doxycycline without significant improvement in her symptoms. She went to ED for further evaluation 3 days prior after feeling light-headed and was found to have pulmonary edema. She was given a dose of IV furosemide and discharged with recommendation to follow up with PCP. She mentions that she only takes hydrochlorothiazide for her diuretics.  A/P Present with acute on chronic hypoxic respiratory failure for follow up after ED visit 3 days prior. BNP wnl 3 days prior but chest X-ray with pulmonary edema. +40 pound weigt gain since last clinic visit with significant edema and rales on exam. Currently on 5L [place] and satting 100%. Likely due to inadequate diuretic regimen as she is not on furosemide or torsemide at home. Would be a great candidate for heart failure at home program but too tachycardic, hypoxic to be safely sent home. Will need admission for inpatient monitoring and IV diuresis.  - Admit to telemetry (Patient requests to wait for bed to open up at home) - discussed red-flag symptoms and to call EMS emergently if worsening hypoxia / new chest pain - Advised that due to pandemic, next bed may not be available until tomorrow. Will send po furosemide to home pharmacy to start treatment while awaiting bed placement - BMP, cbc, bnp

## 2019-11-25 NOTE — Patient Instructions (Signed)
Thank you for allowing [name] to provide your care today. Today we discussed your shortness of breath  I have ordered bmp, cbc, bnp labs for you. I will call if any are abnormal.    Today we will make arrangements for you to be admitted to the hospital for inpatient diuresis  Please follow-up after discharge.    Should you have any questions or concerns please call the internal medicine clinic at [phone].

## 2019-11-25 NOTE — Progress Notes (Signed)
Late Entry:  At 1055 this RN called bed placement, spoke with [name].  Tele bed/ in-patient, full admit requested with diagnosis of heart failure exacerbation per Dr. Marguerita Beards instructions.  A direct admit was requested per patient per Dr. Truman Hayward.   Patient informed RN she would like to be called at [phone] when bed is ready, this number was given to [place] in bed placement.  Patient was instructed someone from [facility] would call her when a bed became available, which could be anytime late this afternoon, through the night, or tomorrow and it would be her responsibility to have transportation arrangements back to the hospital.  She verbalized understanding and states she will arrange transportation back to hospital.  She was instructed if she develops worsening SOB, any dizziness or chest pain to call 911 and she verbalized understanding. SChaplin, RN,BSN

## 2019-11-26 ENCOUNTER — Inpatient Hospital Stay (HOSPITAL_COMMUNITY): Payer: Medicare Other

## 2019-11-26 ENCOUNTER — Ambulatory Visit (INDEPENDENT_AMBULATORY_CARE_PROVIDER_SITE_OTHER): Payer: Medicare Other | Admitting: Licensed Clinical Social Worker

## 2019-11-26 DIAGNOSIS — F410 Panic disorder [episodic paroxysmal anxiety] without agoraphobia: Secondary | ICD-10-CM | POA: Diagnosis not present

## 2019-11-26 DIAGNOSIS — F411 Generalized anxiety disorder: Secondary | ICD-10-CM

## 2019-11-26 DIAGNOSIS — F4312 Post-traumatic stress disorder, chronic: Secondary | ICD-10-CM | POA: Diagnosis not present

## 2019-11-26 DIAGNOSIS — F331 Major depressive disorder, recurrent, moderate: Secondary | ICD-10-CM | POA: Diagnosis not present

## 2019-11-26 DIAGNOSIS — I5033 Acute on chronic diastolic (congestive) heart failure: Secondary | ICD-10-CM

## 2019-11-26 LAB — COMPREHENSIVE METABOLIC PANEL
ALT: 15 U/L (ref 0–44)
AST: 12 U/L — ABNORMAL LOW (ref 15–41)
Albumin: 3.8 g/dL (ref 3.5–5.0)
Alkaline Phosphatase: 49 U/L (ref 38–126)
Anion gap: 12 (ref 5–15)
BUN: 15 mg/dL (ref 6–20)
CO2: 27 mmol/L (ref 22–32)
Calcium: 9.3 mg/dL (ref 8.9–10.3)
Chloride: 100 mmol/L (ref 98–111)
Creatinine, Ser: 1.6 mg/dL — ABNORMAL HIGH (ref 0.44–1.00)
GFR, Estimated: 39 mL/min — ABNORMAL LOW (ref >60)
Glucose, Bld: 121 mg/dL — ABNORMAL HIGH (ref 70–99)
Potassium: 3.7 mmol/L (ref 3.5–5.1)
Sodium: 139 mmol/L (ref 135–145)
Total Bilirubin: 0.7 mg/dL (ref 0.3–1.2)
Total Protein: 7.2 g/dL (ref 6.5–8.1)

## 2019-11-26 LAB — ECHOCARDIOGRAM COMPLETE
Area-P 1/2: 3.6 cm2
Height: 71 in
S' Lateral: 3.75 cm
Single Plane A4C EF: 41.5 %
Weight: 6321.03 oz

## 2019-11-26 LAB — TROPONIN I (HIGH SENSITIVITY)
Troponin I (High Sensitivity): 8 ng/L (ref <18)
Troponin I (High Sensitivity): 7 ng/L (ref <18)

## 2019-11-26 IMAGING — DX DG CLAVICLE*R*
2 series · 2 of 2 positions shown · non-contrast
Comparison: None.

CLINICAL DATA: Right clavicle pain.  Oral lesion.

EXAM:
RIGHT CLAVICLE - 2+ VIEWS

[clavicle ap]
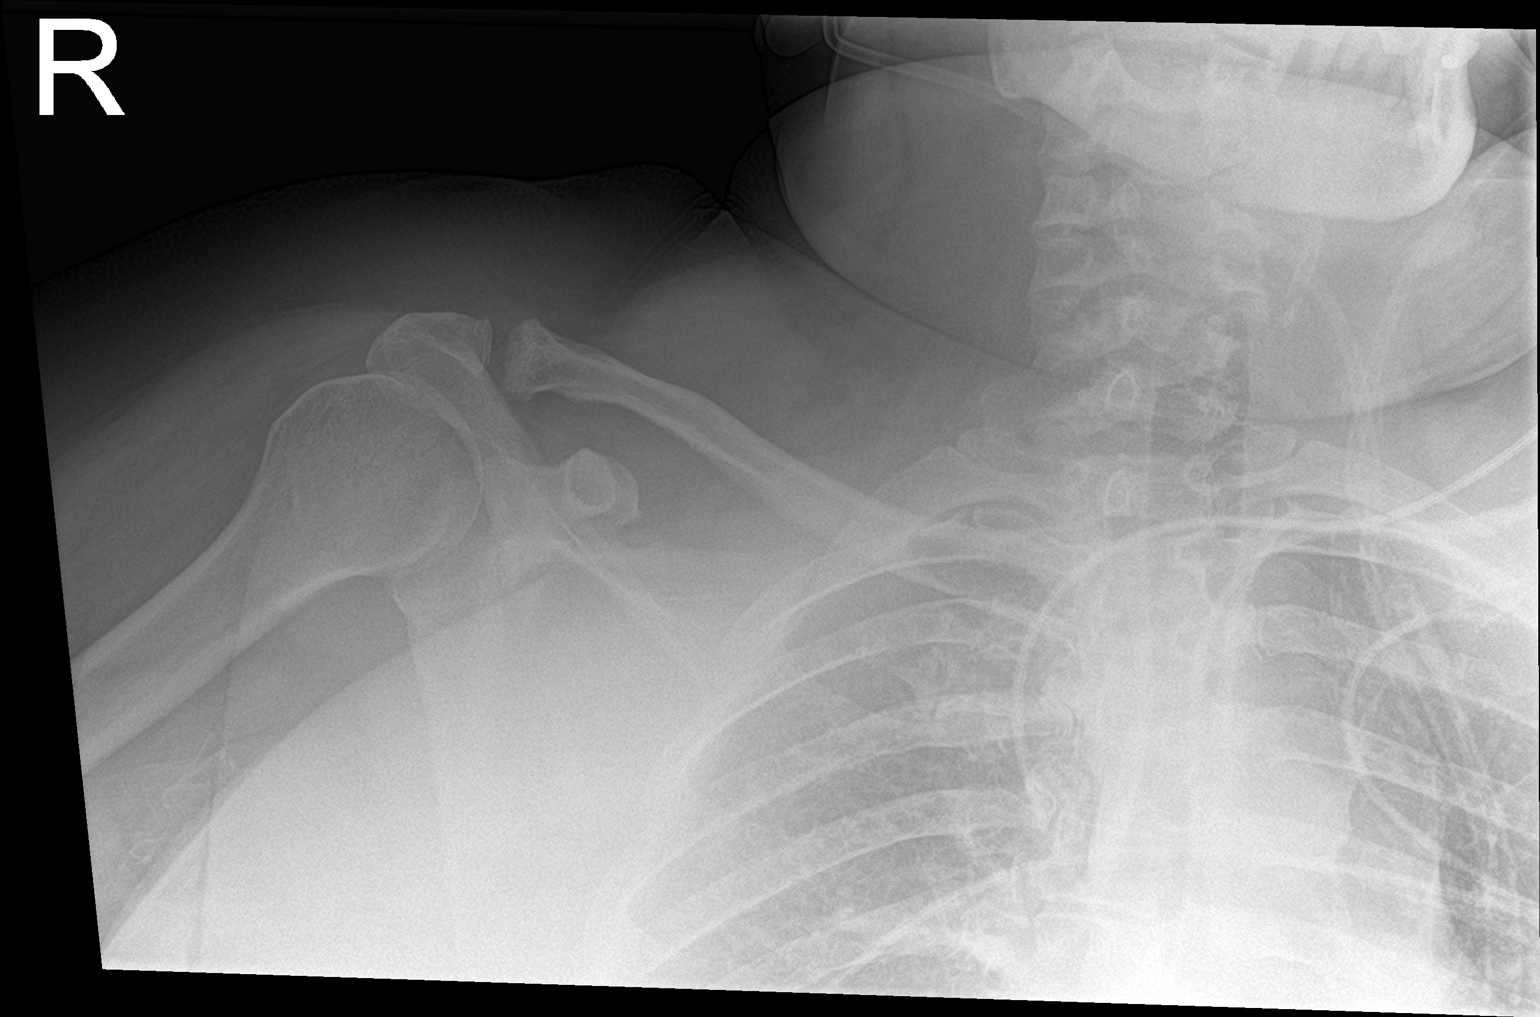

[clavicle axial]
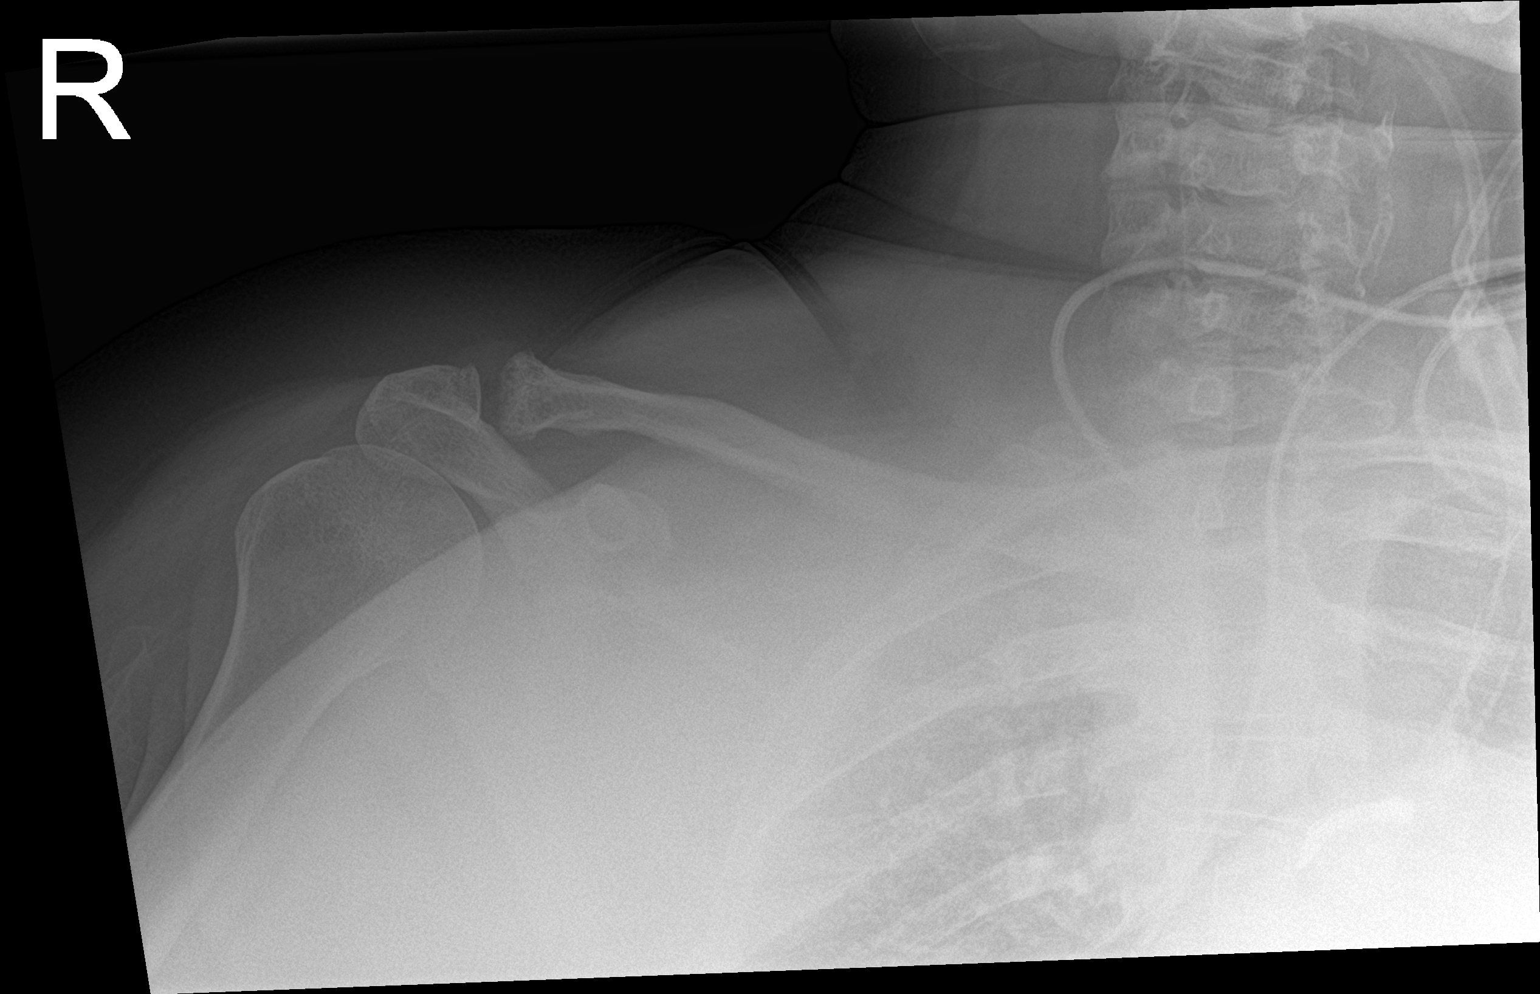

[2 of 2 positions shown; findings below may reference images not displayed]

FINDINGS: Negative for fracture. Shoulder joint normal. Degenerative change in
spurring in the AC joint.
IMPRESSION: AC degenerative spurring.  No acute abnormality.

## 2019-11-26 IMAGING — DX DG CHEST 1V
1 series · 1 of 1 positions shown · non-contrast
Comparison: [DATE]

CLINICAL DATA: Short of breath

EXAM:
CHEST  1 VIEW

[chest ap]
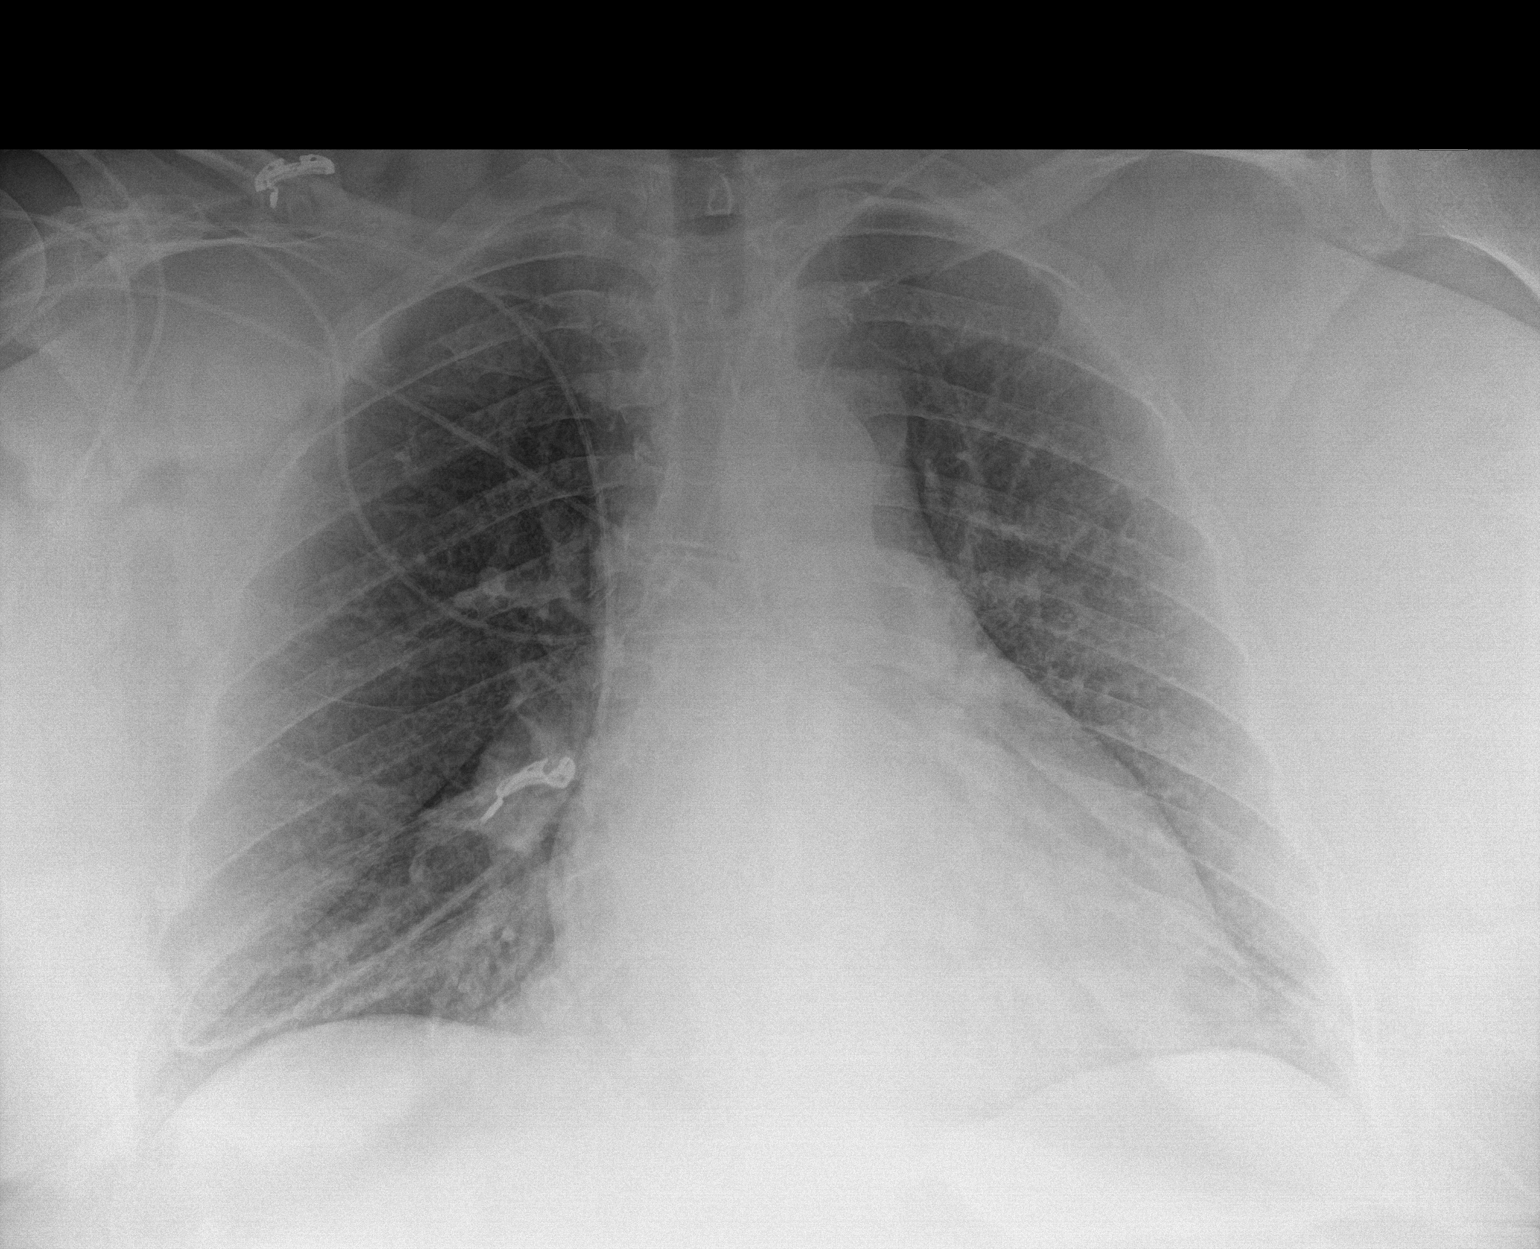

[1 of 1 positions shown; findings below may reference images not displayed]

FINDINGS: Mild cardiac enlargement without heart failure. Lungs are clear
without infiltrate or effusion.
IMPRESSION: No active disease.

## 2019-11-26 MED ORDER — OXYCODONE HCL 5 MG PO TABS
5.0000 mg | ORAL_TABLET | Freq: Once | ORAL | Status: AC
  Administered 2019-11-26: 5 mg via ORAL
  Filled 2019-11-26: qty 1

## 2019-11-26 MED ORDER — FUROSEMIDE 10 MG/ML IJ SOLN
80.0000 mg | Freq: Two times a day (BID) | INTRAMUSCULAR | Status: DC
  Administered 2019-11-26: 80 mg via INTRAVENOUS
  Filled 2019-11-26: qty 8

## 2019-11-26 MED ORDER — LIDOCAINE 5 % EX PTCH
1.0000 | MEDICATED_PATCH | Freq: Every day | CUTANEOUS | Status: DC
  Administered 2019-11-26 – 2019-11-28 (×4): 1 via TRANSDERMAL
  Filled 2019-11-26 (×4): qty 1

## 2019-11-26 MED ORDER — CYCLOBENZAPRINE HCL 10 MG PO TABS
5.0000 mg | ORAL_TABLET | Freq: Every evening | ORAL | Status: DC | PRN
  Administered 2019-11-26 – 2019-11-28 (×4): 5 mg via ORAL
  Filled 2019-11-26 (×4): qty 1

## 2019-11-26 NOTE — Progress Notes (Signed)
Virtual Visit via Telephone Note  I connected with Lindsay [name] on 11/26/19 at  8:00 AM EDT by telephone and verified that I am speaking with the correct person using two identifiers.  Location: Patient: hospital Provider: home   I discussed the limitations, risks, security and privacy concerns of performing an evaluation and management service by telephone and the availability of in person appointments. I also discussed with the patient that there may be a patient responsible charge related to this service. The patient expressed understanding and agreed to proceed.   I discussed the assessment and treatment plan with the patient. The patient was provided an opportunity to ask questions and all were answered. The patient agreed with the plan and demonstrated an understanding of the instructions.   The patient was advised to call back or seek an in-person evaluation if the symptoms worsen or if the condition fails to improve as anticipated.  I provided 45 minutes of non-face-to-face time during this encounter.  THERAPIST PROGRESS NOTE  Session Time: 8:00 AM to 8:45 AM  Participation Level: Active  Behavioral Response: CasualAlertAnxious  Type of Therapy: Individual Therapy  Treatment Goals addressed:  help cope with health issues, anxiety, triggers, coping  Interventions: Solution Focused, Strength-based, Supportive, Reframing and Other: coping  Summary: Lindsay [name] is a 49 y.o. female who presents with admitted to her in hospital 40 lbs of body weight. Plan she believes is to Increase the amount of pills to remove the water. Think it is the best place to be. Panic attacks at home with too much fluid to get rid of and having to get to the bathroom. Handling things on own meant more exerted, out of breath at home. Still has pain issues. Gave her Flexeril or lidocaine patch once. It is still not working. "Feel like losing mind. Scary that I had to come here." Told the doctor that "I am  really suffering." In discussion patient shared more of her history in explaining that without her computer is like "Linus without his blanket". Provides information for others and therapist encouraged her to do research for yourself on furniture. Started working on relationship with herself with prior therapist. Started doing self-care buying flowers for herself. Felt good doing it. Talking to therapist is self-care who else can she talk to, feedback that she needs and anything positive to say. Shared about speaking up for her daughter in the school system and how this was a strength. Quickly figure out what you are going to do and learn stuff. Who to contact and who to call. Pound the pavement and finally the job done. Didn't look at it as skill but what a parent does for the child.  Reviewed session and patient thinks helpful to touch base with therapist weekly as she did with other therapist as it was helpful.  Suicidal/Homicidal: No  Therapist Response: Therapist reviewed symptoms, facilitated expression of thoughts and feelings, utilize reframing to help in patient's own insight that being in the hospital was the best place for her right now as  managing symptoms on her own has caused her more suffering without resolution of issues, needing to stabilize symptoms for congestive heart failure which is a new diagnosis for her. Encouraged her to explain to doctors all her symptoms to be adequately treated as hospital is a place where they can treat different symptoms.  Identified significant strength of patient is being an advocate for her daughter and other people guided patient realizing it significant strength for her in  life to also speak up for herself and what she needs.  Utilized insight type interventions for also patient to recognize she needs to be an instrument for positive things to happen in her life, to develop a good relationship with herself and reviewed in therapy before when she was  exercising self-care that she felt better so that would be something to encourage in therapy with this therapist.  Therapist provided active listening, open questions supportive interventions  Plan: Return again in 1 week.2.Therapist work with patient on depression, anxiety, processing through feelings related to past, managing health issues  Diagnosis: Axis I:  Major depressive disorder, recurrent, moderate, generalized anxiety disorder, chronic PTSD, panic attacks    Axis II: No diagnosis    [name], LCSW 11/26/2019

## 2019-11-26 NOTE — Progress Notes (Signed)
Echocardiogram 2D Echocardiogram has been performed.  [name] [name] 11/26/2019, 11:44 AM

## 2019-11-26 NOTE — Progress Notes (Signed)
Patient refused CPAP for the night  

## 2019-11-26 NOTE — Progress Notes (Signed)
Pt complained of pain in her back at a level 10 that's been present ever since her fall on Monday.  She was given 650 mg Tylenol tabs at 2026 but it had no improvement.  Informed the on call physician and he ordered Flexeril 5 mg orally as needed at bedtime for muscle spasms and back, and a Lidocaine 5% transdermal patch on the back daily at bedtime.  Pt received both Flexeril and the Lidocaine patch.  Will continue to monitor.  Lupita Dawn, RN

## 2019-11-26 NOTE — Progress Notes (Signed)
HD#1 Subjective:  Overnight Events: Back pain. Flexeril and lidocaine patch given  Patient reports she is still feeling short of breath, both at rest and with movement to the bathroom. Can tell stomach is not as tight. Urinated twice overnight, but feels as though she has not urinated more than normal.  Expresses concern about collar bone hurting since fall on Monday. Denies landing on her shoulder or any trauma to the area.   When asked about the lesion in her mouth, the patient endorses at times pus and blood can be expressed from the area. 10/01/19 imaging of mouth. Was Supposed to see oral surgeon on Tuesday (11/23/19). Having trouble with oxygen tank being filled at home.  Objective:  Vital signs in last 24 hours: Vitals:   11/25/19 2352 11/26/19 0008 11/26/19 0230 11/26/19 0417  BP:  (!) 136/96  132/75  Pulse:  (!) 103 (!) 101 (!) 108  Resp:  (!) _0 Temp:  98.8 F (37.1 C)  99.6 F (37.6 C)  TempSrc:  Oral  Oral  SpO2: 97% 98% 98% 98%  Weight:    (!) 179.2 kg  Height:       Supplemental O2: Nasal Cannula SpO2: 98 % O2 Flow Rate (L/min): 2 L/min  Physical Exam:  Physical Exam Vitals and nursing note reviewed.  Constitutional:      Appearance: She is obese.  HENT:     Head: Normocephalic and atraumatic.     Mouth/Throat:     Comments: Growth posterior to bottom teeth. Tooth appears to be in growth. Approximately 1.5-2 cm. Mobile. Tender to touch. No active bleeding or pus from area.  Cardiovascular:     Rate and Rhythm: Normal rate and regular rhythm.     Pulses: Normal pulses.     Heart sounds: Normal heart sounds. No murmur heard.  No friction rub. No gallop.   Pulmonary:     Effort: Pulmonary effort is normal. No respiratory distress.  Abdominal:     General: Bowel sounds are normal.  Musculoskeletal:     Right lower leg: Edema (1+) present.     Left lower leg: Edema (1+) present.  Neurological:     General: No focal deficit present.     Mental  Status: She is alert and oriented to person, place, and time. Mental status is at baseline.  Psychiatric:        Mood and Affect: Mood normal.        Behavior: Behavior normal.          Filed Weights   11/25/19 1526 11/26/19 0417  Weight: (!) 183.6 kg (!) 179.2 kg    Intake/Output Summary (Last 24 hours) at 11/26/2019 0709 Last data filed at 11/26/2019 0102 Gross per 24 hour  Intake 570 ml  Output 1150 ml  Net -580 ml   Net IO Since Admission: -580 mL [11/26/19 0709]  Pertinent Labs: CBC Latest Ref Rng & Units 11/25/2019 11/25/2019 11/22/2019  WBC 4.0 - 10.5 K/uL 8.3 9.6 -  Hemoglobin 12.0 - 15.0 g/dL 13.6 13.4 13.3  Hematocrit 36 - 46 % 43.2 42.8 39.0  Platelets 150 - 400 K/uL 354 443(H) -    CMP Latest Ref Rng & Units 11/25/2019 11/25/2019 11/22/2019  Glucose 70 - 99 mg/dL 88 148(H) -  BUN 6 - 20 mg/dL 11 11 -  Creatinine 0.44 - 1.00 mg/dL 1.26(H) 1.16(H) -  Sodium 135 - 145 mmol/L 140 139 142  Potassium 3.5 - 5.1 mmol/L  3.7 3.9 4.8  Chloride 98 - 111 mmol/L 105 103 -  CO2 22 - 32 mmol/L 24 24 -  Calcium 8.9 - 10.3 mg/dL 9.5 9.3 -  Total Protein 6.5 - 8.1 g/dL - - -  Total Bilirubin 0.3 - 1.2 mg/dL - - -  Alkaline Phos 38 - 126 U/L - - -  AST 15 - 41 U/L - - -  ALT 0 - 44 U/L - - -    Imaging: No results found.  Assessment/Plan:   Active Problems:   Acute on chronic heart failure ([place])   Patient Summary: Lindsay [name] is a 49 y.o. with pertinent PMH of HFrEF, nephrotic syndrome, obesity hypoventiliation syndrome, obstructive sleep apnea, MDD, fibromyalgia, HTN and morbid obesity who presented with shortness of breath and lower extremity edema, and admitted for dyspnea   Dyspnea Patient continues to be short of breath at rest and with exertion. Net negative 580 I/O. Feels as though abdomen is "less tight." Lower extremity edema seems to have improved with one dose of lasix yesterday evening. Held additional dose as Cr increased to 1.6.  With patient's  history of COPD, suspect possible Cor Pulmonale as possible cause of heart failure. Pending Echocardiogram at this time. Troponin's negative, unremarkable EKG, low suspicion for ischemic etiology at this time. Urine negative for protein, do not suspect nephrotic syndrome at this time.  -hold on additional 40 mg lasix dose -echo pending -urinalysis negative for proteinuria -strict I/O's -daily weights  Acute Kidney Injury Cr of 1.26>1.60 Baseline <1. Suspect increase in Cr due to diuretics use. -hold diuretics at this time -urinalysis pending -repeat CMP tomorrow -hold nephrotoxic medications  COPD History of COPD, last PFT's 02/09/2018, revealed moderately severe obstructive airways disease. On home oxygen, 5L nasal cannula.  -continue oxygen supplementation, currently 2L -continue home albuterol, dulera.  Mouth Growth Patient with chronic mouth growth that has been increasing in size per patient. She endorses white liquid coming out of area. Was prescribed antibiotics in the past and told related to gingivitis. Unsure if neoplastic in etiology. Patient supposed to have met with dental surgery 11/23/19 however missed appointment due to feeling short of breath -recommend patient reschedule appointment with dental surgery  Mechanical Fall Patient with recent mechanical fall. Imaging performed during ED visit 4 days ago, negative for acute process. Negative bilateral knee x-rays. Patient with no cervical or spinal tenderness upon examination. Endorsed right clavicular pain today.  -right clavicular x-ray negative for fracture -tylenol for pain -continue to monitor, consider further imaging if not improving  Fibromyalgia Continue home medications; lamictal  Diet: Heart Healthy IVF: None,None VTE: Enoxaparin Code: Full PT/OT recs: None, none.  Dispo: Anticipated discharge to Home in 0-1 days pending echocardiogram  [facility] Resident PGY-1 Pager  [phone] Please contact the on call pager after 5 pm and on weekends at [phone].

## 2019-11-27 LAB — BASIC METABOLIC PANEL
Anion gap: 14 (ref 5–15)
BUN: 24 mg/dL — ABNORMAL HIGH (ref 6–20)
CO2: 25 mmol/L (ref 22–32)
Calcium: 9.2 mg/dL (ref 8.9–10.3)
Chloride: 97 mmol/L — ABNORMAL LOW (ref 98–111)
Creatinine, Ser: 1.47 mg/dL — ABNORMAL HIGH (ref 0.44–1.00)
GFR, Estimated: 43 mL/min — ABNORMAL LOW (ref >60)
Glucose, Bld: 127 mg/dL — ABNORMAL HIGH (ref 70–99)
Potassium: 4.2 mmol/L (ref 3.5–5.1)
Sodium: 136 mmol/L (ref 135–145)

## 2019-11-27 LAB — CBC
HCT: 38.2 % (ref 36.0–46.0)
Hemoglobin: 12.1 g/dL (ref 12.0–15.0)
MCH: 26.7 pg (ref 26.0–34.0)
MCHC: 31.7 g/dL (ref 30.0–36.0)
MCV: 84.3 fL (ref 80.0–100.0)
Platelets: 411 10*3/uL — ABNORMAL HIGH (ref 150–400)
RBC: 4.53 MIL/uL (ref 3.87–5.11)
RDW: 18 % — ABNORMAL HIGH (ref 11.5–15.5)
WBC: 9.2 10*3/uL (ref 4.0–10.5)
nRBC: 0 % (ref 0.0–0.2)

## 2019-11-27 MED ORDER — ALBUTEROL SULFATE (2.5 MG/3ML) 0.083% IN NEBU
2.5000 mg | INHALATION_SOLUTION | Freq: Four times a day (QID) | RESPIRATORY_TRACT | Status: DC
  Administered 2019-11-27 – 2019-11-28 (×2): 2.5 mg via RESPIRATORY_TRACT
  Filled 2019-11-27 (×3): qty 3

## 2019-11-27 MED ORDER — FUROSEMIDE 10 MG/ML IJ SOLN
80.0000 mg | Freq: Once | INTRAMUSCULAR | Status: AC
  Administered 2019-11-27: 80 mg via INTRAVENOUS
  Filled 2019-11-27: qty 8

## 2019-11-27 MED ORDER — OXYCODONE HCL 5 MG PO TABS
5.0000 mg | ORAL_TABLET | Freq: Two times a day (BID) | ORAL | Status: DC | PRN
  Administered 2019-11-27 – 2019-11-29 (×4): 5 mg via ORAL
  Filled 2019-11-27 (×4): qty 1

## 2019-11-27 MED ORDER — OXYCODONE HCL 5 MG PO TABS
5.0000 mg | ORAL_TABLET | Freq: Once | ORAL | Status: AC
  Administered 2019-11-27: 5 mg via ORAL
  Filled 2019-11-27: qty 1

## 2019-11-27 MED ORDER — OXYCODONE-ACETAMINOPHEN 5-325 MG PO TABS
1.0000 | ORAL_TABLET | Freq: Once | ORAL | Status: AC
  Administered 2019-11-27: 1 via ORAL
  Filled 2019-11-27: qty 1

## 2019-11-27 MED ORDER — FUROSEMIDE 10 MG/ML IJ SOLN
80.0000 mg | Freq: Two times a day (BID) | INTRAMUSCULAR | Status: DC
  Administered 2019-11-27: 80 mg via INTRAVENOUS
  Filled 2019-11-27 (×2): qty 8

## 2019-11-27 NOTE — Hospital Course (Addendum)
#  Acute on chronic heart failure exacerbation, resolving Patient admitted to IMTS directly from clinic on 11/25/19 for acute respiratory failure in the setting of acute on chronic heart failure exacerbation. Following admission, patient received aggressive IV diuresis with decreasing of her home oxygen requirement from 5L to 3L. She had repeat echocardiogram which revealed EF of 45-50%. Her subjective complaints of shortness of breath improved, and patient's volume status improved significantly over the subsequent days. She was transitioned to oral diuretic regimen of 40mg  lasix daily and discharged home in stable condition with instruction to limit home fluid and salt intake. Physical therapy recommended continuation of home health PT and a referral was placed.    #Acute Kidney Injury, active Creatinine improved to 1.28 from 1.49 on day of discharge, however this is still elevated from baseline of 0.9-1.0. Suspect further improvement of her AKI with transitioning to oral diuretic regimen with discontinuation of aggressive IV diuresis. Patient will need repeat BMP in the outpatient setting.

## 2019-11-27 NOTE — Progress Notes (Signed)
HD#2 Subjective:  Overnight Events: Back pain.  Reports lidocaine patch and Flexeril did not help.  Ordered one-time dose of oxycodone 5 mg  Patient reports that she is continuing to have significant back and knee pain from her mechanical fall.  States the medication she received overnight did seem to help resolve her pain for about 12 hours.  She states at home she requires 5 L of supplemental oxygen.  Currently on 3 L saturating well.  She states she is having bilateral side pain with breathing.  Denies any chest pain, shortness of breath.  She feels her abdominal distention has improved.  She is unable to tell if her lower extremity swelling has improved.  Patient was evaluated shortly after morning rounds due to mews score 10 due to tachycardia. Patient was reevaluated at the bedside with heart rate in the 110s.  States she was not on her supplemental oxygen, continuing to have pain and feeling short of breath after readjusting herself in bed.  Discussed adjusting her pain regimen and plan to monitor.  Paged that patient had a traumatic event regarding the toilet bidet.  Reportedly patient was attempting to use the restroom and as she was coming down she states the bidet penetrated her rectum.  She noticed bleeding immediately and experienced severe, sharp pain.  On exam there was a small skin laceration superior to the rectum with a small amount of active bleeding.  Discussed pain control and monitoring for large active bleeding.    Objective:  Vital signs in last 24 hours: Vitals:   11/27/19 0840 11/27/19 0910 11/27/19 1012 11/27/19 1100  BP: 119/87  114/67 99/77  Pulse: (!) 112 (!) 113 (!) 112 (!) 126  Resp: 14 20 19 20   Temp: 98.3 F (36.8 C)  98.6 F (37 C) 98.6 F (37 C)  TempSrc: Oral  Oral Oral  SpO2: 97% 96% 97% 98%  Weight:      Height:       Supplemental O2: Nasal Cannula SpO2: 98 % O2 Flow Rate (L/min): 3 L/min FiO2 (%): 32 %  Physical Exam:    General:   Filed Weights   11/25/19 1526 11/26/19 0417  Weight: (!) 183.6 kg (!) 181.4 kg    Intake/Output Summary (Last 24 hours) at 11/27/2019 1140 Last data filed at 11/27/2019 0833 Gross per 24 hour  Intake --  Output 1950 ml  Net -1950 ml   Net IO Since Admission: -2,530 mL [11/27/19 1140]  Pertinent Labs: CBC Latest Ref Rng & Units 11/27/2019 11/25/2019 11/25/2019  WBC 4.0 - 10.5 K/uL 9.2 8.3 9.6  Hemoglobin 12.0 - 15.0 g/dL 12.1 13.6 13.4  Hematocrit 36 - 46 % 38.2 43.2 42.8  Platelets 150 - 400 K/uL 411(H) 354 443(H)    CMP Latest Ref Rng & Units 11/27/2019 11/26/2019 11/25/2019  Glucose 70 - 99 mg/dL 127(H) 121(H) 88  BUN 6 - 20 mg/dL 24(H) 15 11  Creatinine 0.44 - 1.00 mg/dL 1.47(H) 1.60(H) 1.26(H)  Sodium 135 - 145 mmol/L 136 139 140  Potassium 3.5 - 5.1 mmol/L 4.2 3.7 3.7  Chloride 98 - 111 mmol/L 97(L) 100 105  CO2 22 - 32 mmol/L 25 27 24   Calcium 8.9 - 10.3 mg/dL 9.2 9.3 9.5  Total Protein 6.5 - 8.1 g/dL - 7.2 -  Total Bilirubin 0.3 - 1.2 mg/dL - 0.7 -  Alkaline Phos 38 - 126 U/L - 49 -  AST 15 - 41 U/L - 12(L) -  ALT 0 -  44 U/L - 15 -    Imaging: ECHOCARDIOGRAM COMPLETE  Result Date: 11/26/2019    ECHOCARDIOGRAM REPORT   Patient Name:   Lindsay [name] Date of Exam: 11/26/2019 Medical Rec #:  [id number]     Height:       71.0 in Accession #:    [id number]    Weight:       395.1 lb Date of Birth:  06/01/1970     BSA:          2.816 m Patient Age:    49 years      BP:           137/71 mmHg Patient Gender: F             HR:           103 bpm. Exam Location:  Inpatient Procedure: 2D Echo, Color Doppler and Cardiac Doppler Indications:    I50.9* Heart failure (unspecified)  History:        Patient has prior history of Echocardiogram examinations, most                 recent 03/07/2019. CHF; Risk Factors:Hypertension and Sleep Apnea.  Sonographer:    Raquel Sarna Senior RDCS Referring Phys: [id number] ERIK C HOFFMAN  Sonographer Comments: Technically difficult due to patient  body habitus. IMPRESSIONS  1. Left ventricular ejection fraction, by estimation, is 45 to 50%. The left ventricle has mildly decreased function. Left ventricular endocardial border not optimally defined to evaluate regional wall motion. There is mild concentric left ventricular hypertrophy. Left ventricular diastolic parameters are consistent with Grade I diastolic dysfunction (impaired relaxation).  2. Right ventricular systolic function is normal. The right ventricular size is normal. Tricuspid regurgitation signal is inadequate for assessing PA pressure.  3. The mitral valve is grossly normal. No evidence of mitral valve regurgitation. No evidence of mitral stenosis.  4. The aortic valve is grossly normal. Aortic valve regurgitation is not visualized. No aortic stenosis is present. Comparison(s): No significant change from prior study. Poor quality study. EF is similar ~45%. FINDINGS  Left Ventricle: Left ventricular ejection fraction, by estimation, is 45 to 50%. The left ventricle has mildly decreased function. Left ventricular endocardial border not optimally defined to evaluate regional wall motion. The left ventricular internal cavity size was normal in size. There is mild concentric left ventricular hypertrophy. Left ventricular diastolic parameters are consistent with Grade I diastolic dysfunction (impaired relaxation). Right Ventricle: The right ventricular size is normal. No increase in right ventricular wall thickness. Right ventricular systolic function is normal. Tricuspid regurgitation signal is inadequate for assessing PA pressure. Left Atrium: Left atrial size was normal in size. Right Atrium: Right atrial size was normal in size. Pericardium: There is no evidence of pericardial effusion. Mitral Valve: The mitral valve is grossly normal. No evidence of mitral valve regurgitation. No evidence of mitral valve stenosis. Tricuspid Valve: The tricuspid valve is grossly normal. Tricuspid valve  regurgitation is trivial. No evidence of tricuspid stenosis. Aortic Valve: The aortic valve is grossly normal. Aortic valve regurgitation is not visualized. No aortic stenosis is present. Pulmonic Valve: The pulmonic valve was grossly normal. Pulmonic valve regurgitation is not visualized. No evidence of pulmonic stenosis. Aorta: The aortic root and ascending aorta are structurally normal, with no evidence of dilitation. Venous: The inferior vena cava was not well visualized. IAS/Shunts: The atrial septum is grossly normal.  LEFT VENTRICLE PLAX 2D LVIDd:         4.65 cm  Diastology LVIDs:         3.75 cm      LV e' medial:    6.20 cm/s LV PW:         1.67 cm      LV E/e' medial:  6.6 LV IVS:        1.21 cm      LV e' lateral:   4.57 cm/s LVOT diam:     2.50 cm      LV E/e' lateral: 8.9 LV SV:         61 LV SV Index:   22 LVOT Area:     4.91 cm  LV Volumes (MOD) LV vol d, MOD A4C: 128.0 ml LV vol s, MOD A4C: 74.9 ml LV SV MOD A4C:     128.0 ml RIGHT VENTRICLE RV S prime:     12.30 cm/s TAPSE (M-mode): 2.2 cm LEFT ATRIUM             Index       RIGHT ATRIUM           Index LA diam:        3.10 cm 1.10 cm/m  RA Area:     18.40 cm LA Vol (A2C):   66.6 ml 23.65 ml/m RA Volume:   57.00 ml  20.24 ml/m LA Vol (A4C):   19.9 ml 7.07 ml/m LA Biplane Vol: 37.7 ml 13.39 ml/m  AORTIC VALVE LVOT Vmax:   76.70 cm/s LVOT Vmean:  59.600 cm/s LVOT VTI:    0.124 m  AORTA Ao Root diam: 3.50 cm MITRAL VALVE MV Area (PHT): 3.60 cm    SHUNTS MV Decel Time: 211 msec    Systemic VTI:  0.12 m MV E velocity: 40.70 cm/s  Systemic Diam: 2.50 cm MV A velocity: 56.60 cm/s MV E/A ratio:  0.72 Eleonore Chiquito MD Electronically signed by Eleonore Chiquito MD Signature Date/Time: 11/26/2019/3:14:55 PM    Final     Assessment/Plan:   Active Problems:   Acute on chronic heart failure [facility])   Patient Summary: Lindsay [name] is a 49 y.o. with pertinent PMH of HFrEF, nephrotic syndrome, obesity hypoventiliation syndrome, obstructive sleep  apnea, MDD, fibromyalgia, HTN and morbid obesity who presented with shortness of breath and lower extremity edema, and admitted for dyspnea   Dyspnea Patient continues to feel short of breath only with exertion today.  Output approximately 2 L in the past 24 hours.  Dry weight for today pending, down 4 kg from admission.  Abdominal distention and lower extremity edema continue to improve.  Continuing IV Lasix 80 mg twice daily.  Echocardiogram unremarkable, demonstrated EF 45 to 50% with left ventricle mild decreased function. -Continue IV Lasix 80 mg twice daily -strict I/O's -daily weights -trend BMP -daily weights  Acute Kidney Injury Cr 1.47, improved from 1.60. Will continue to diurese.  -trend bmp  Traumatic perianal injury Patient reportedly had an incident as she was trying to use the restroom, came down on the bidet and states this penetrated her rectum.  She medially noticed bleeding in the toilet and had severe pain.  On rectal exam there was a small linear laceration just superior to her rectum with no evidence of large active bleeding.  Discussed monitoring at this time and pain control.  If bleeding persists will trend CBC and perform a full rectal exam.  Mechanical Fall Patient with recent mechanical fall.  Imaging thus far has been negative for any acute fracture.  Patient states that  the lidocaine patch and Flexeril did not help her pain.  Reports she had about 12 hours relief pain on oxycodone 5 mg.  Recommended we continue Tylenol for pain and oxycodone for severe breakthrough pain. -tylenol for pain -continue to monitor, consider further imaging if not improving  Fibromyalgia Continue home medications; lamictal  Diet: Heart Healthy IVF: None,None VTE: Enoxaparin Code: Full PT/OT recs: None, none.  Dispo: Anticipated discharge to Home in 1-2 days pending clinical improvement.  Harlow Ohms, DO Internal Medicine Resident PGY-2 Pager [phone] Please contact  the on call pager after 5 pm and on weekends at [phone].

## 2019-11-27 NOTE — Progress Notes (Signed)
  Date: 11/27/2019  Patient name: Lindsay [name]  Medical record number: [id number]  Date of birth: [birth year]   This patient's plan of care was discussed with the house staff. Please see Dr. Olevia Perches note for complete details. I concur with their findings.   Sid Falcon, MD 11/27/2019, 7:43 PM

## 2019-11-27 NOTE — Evaluation (Signed)
Physical Therapy Evaluation Patient Details Name: Lindsay [name] MRN: [id number] DOB: 02-Sep-1970 Today's Date: 11/27/2019   History of Present Illness  49 y.o. female admitted on 11/25/19 for SOB.  Pt dx with dyspnea, AKI, fall.  Pt with significant PMH of fibromyalgia, mouth growth, COPD on 4-5 L O2 at home, h/o falls, morbid obesity, lobw back pain HTN, CHF.  Clinical Impression  Pt self limiting by buttocks pain as she sat on the silver sprayer attachment in the bathroom earlier.  She was agreeable to go to the door in her room and back and did not have her O2 on for >5 mins and sats were 94% or higher even during mobility despite 2/4 DOE.  HR 110s-120s during mobility (100-110 at rest).  Pt reports significant h/o frequent mechanical falls (mostly tripping over her shoes/own feet).  She was supposed to start OP PT at [facility], but feels she is not physically able to handle getting into and out of an office setting.  HHPT recommended.  PT to follow acutely for deficits listed below.      Follow Up Recommendations Home health PT    Equipment Recommendations  None recommended by PT    Recommendations for Other Services       Precautions / Restrictions Precautions Precautions: Fall Precaution Comments: h/o mechanical falls       Mobility  Bed Mobility Overal bed mobility: Modified Independent             General bed mobility comments: HOB elevated used rail    Transfers Overall transfer level: Needs assistance   Transfers: Sit to/from Stand Sit to Stand: Min guard         General transfer comment: Min guard for safety, pt stands from very wide base likely due to body habitus.  Ambulation/Gait Ambulation/Gait assistance: Supervision Gait Distance (Feet): 20 Feet (pt only agreeable to door and back to bed. ) Assistive device: None (declined using the RW in room (too small anyway). ) Gait Pattern/deviations: Wide base of support;Antalgic Gait  velocity: decreased Gait velocity interpretation: 1.31 - 2.62 ft/sec, indicative of limited community ambulator General Gait Details: Wide BOS due to body habitus, mildly antalgic favoring R knee, pt did not want to use her O2 and sats stayed in the mid to upper 90s despite exertional DOE (2/4), O2 3L re-applied at end of session HR 110s-120s during mobility (100-110s at rest).   Stairs            Wheelchair Mobility    Modified Rankin (Stroke Patients Only)       Balance Overall balance assessment: Mild deficits observed, not formally tested                                           Pertinent Vitals/Pain Pain Assessment: Faces Faces Pain Scale: Hurts even more Pain Location: buttocks (sat on the silver sprayer in the bathroom earlier today), R knee Pain Descriptors / Indicators: Grimacing;Guarding Pain Intervention(s): Limited activity within patient's tolerance;Monitored during session;Repositioned    Home Living Family/patient expects to be discharged to:: Private residence Living Arrangements: Parent;Children Available Help at Discharge: Family;Personal care attendant (aide 7 days per week ~2 hours) Type of Home: House Home Access: Level entry     Home Layout: One level Home Equipment: Walker - 4 wheels;Shower seat (home O2 4-5 L at baseline per pt report)  Prior Function Level of Independence: Needs assistance   Gait / Transfers Assistance Needed: uses rollator at times, sometimes nothing, one of her falls was when her shoelaces got caught in rollator wheels.   ADL's / Homemaking Assistance Needed: assist by aide or mother for bathing (especially if gets in shower, mostly doing "sink baths")  She reports she is typically able to perform dressing mod I, but family, or her aide assist if she is unable to perform         Hand Dominance   Dominant Hand: Right    Extremity/Trunk Assessment   Upper Extremity Assessment Upper Extremity  Assessment: Generalized weakness (limited by reports of bil shoulder joint pain)    Lower Extremity Assessment Lower Extremity Assessment: Generalized weakness (limited by reports of chronic bil knee pain since fall, R>L)    Cervical / Trunk Assessment Cervical / Trunk Assessment: Other exceptions Cervical / Trunk Exceptions: chronic low back pain  Communication   Communication: No difficulties  Cognition Arousal/Alertness: Awake/alert Behavior During Therapy: WFL for tasks assessed/performed Overall Cognitive Status: Within Functional Limits for tasks assessed                                        General Comments      Exercises     Assessment/Plan    PT Assessment Patient needs continued PT services  PT Problem List Decreased strength;Decreased activity tolerance;Decreased balance;Decreased mobility;Decreased knowledge of use of DME;Cardiopulmonary status limiting activity;Obesity;Pain       PT Treatment Interventions DME instruction;Gait training;Functional mobility training;Therapeutic activities;Therapeutic exercise;Balance training;Stair training;Patient/family education;Modalities    PT Goals (Current goals can be found in the Care Plan section)  Acute Rehab PT Goals Patient Stated Goal: to get more fluid off of her belly so she can feel better.  PT Goal Formulation: With patient Time For Goal Achievement: 12/11/19 Potential to Achieve Goals: Good    Frequency Min 3X/week   Barriers to discharge        Co-evaluation               AM-PAC PT "6 Clicks" Mobility  Outcome Measure Help needed turning from your back to your side while in a flat bed without using bedrails?: A Little Help needed moving from lying on your back to sitting on the side of a flat bed without using bedrails?: A Little Help needed moving to and from a bed to a chair (including a wheelchair)?: A Little Help needed standing up from a chair using your arms (e.g.,  wheelchair or bedside chair)?: A Little Help needed to walk in hospital room?: None Help needed climbing 3-5 steps with a railing? : A Little 6 Click Score: 19    End of Session Equipment Utilized During Treatment: Oxygen (on/off, left on at end) Activity Tolerance: Patient limited by pain;Other (comment) (limited by DOE) Patient left: in bed;with call bell/phone within reach;with bed alarm set   PT Visit Diagnosis: Muscle weakness (generalized) (M62.81);Difficulty in walking, not elsewhere classified (R26.2);Pain Pain - Right/Left: Right Pain - part of body: Knee    Time: 4709-6283 PT Time Calculation (min) (ACUTE ONLY): 21 min   Charges:   PT Evaluation $PT Eval Moderate Complexity: [place], PT, DPT  Acute Rehabilitation [phone] pager [phone]) [phone] office

## 2019-11-27 NOTE — Progress Notes (Signed)
   11/27/19 0840  Assess: MEWS Score  Temp 98.3 F (36.8 C)  BP 119/87  Pulse Rate (!) 112  ECG Heart Rate (!) 114  Resp 14  Level of Consciousness Alert  SpO2 97 %  O2 Device Room Air  Assess: MEWS Score  MEWS Temp 0  MEWS Systolic 0  MEWS Pulse 2  MEWS RR 0  MEWS LOC 0  MEWS Score 2  MEWS Score Color Yellow  Assess: if the MEWS score is Yellow or Red  Were vital signs taken at a resting state? Yes  Focused Assessment Change from prior assessment (see assessment flowsheet)  Early Detection of Sepsis Score *See Row Information* Medium  MEWS guidelines implemented *See Row Information* Yes  Treat  Pain Scale 0-10  Pain Score 8  Pain Type Acute pain  Pain Location Back  Pain Orientation Left;Right;Mid  Pain Descriptors / Indicators Constant;Dull  Pain Frequency Constant  Pain Onset On-going  Patients Stated Pain Goal 5  Pain Intervention(s) Medication (See eMAR)  Take Vital Signs  Increase Vital Sign Frequency  Yellow: Q 2hr X 2 then Q 4hr X 2, if remains yellow, continue Q 4hrs  Escalate  MEWS: Escalate Yellow: discuss with charge nurse/RN and consider discussing with provider and RRT  Notify: Charge Nurse/RN  Name of Charge Nurse/RN Notified Jen, RN  Date Charge Nurse/RN Notified 11/27/19  Time Charge Nurse/RN Notified 0840  Notify: Provider  Provider Name/Title Areeg Rehman DO  Date Provider Notified 11/27/19  Time Provider Notified [phone]  Notification Type Page  Notification Reason Change in status (tachycardia HR 114)  Response No new orders (No new orders, pending discussion with attending)  Date of Provider Response 11/27/19  Time of Provider Response 0845  Notify: Rapid Response  Name of Rapid Response RN Notified Jaynie Bream, RN  Date Rapid Response Notified 11/27/19  Time Rapid Response Notified 0848  Document  Patient Outcome Other (Comment) (continue to monitor, provider aware)  Progress note created (see row info) Yes

## 2019-11-27 NOTE — Progress Notes (Signed)
Called by patient's nurse because patient was triggering a yellow MEWs score.  RN stated that she was not concerned about the yellow score and just wanted to make [name] aware.    Patient has an elevated HR at 117 when I stopped by and visited with the patient.  Patient denied any chest pain, SOB, palpitations, dizziness.  She needed some assistance getting her oxygen cannula back on.  Patient did complain of some pain, which could also be causing an elevated HR.  Patient was AO and appropriate.    RN let MD know about the MEWS score and some pain medication was added to help the patient out.

## 2019-11-28 DIAGNOSIS — N179 Acute kidney failure, unspecified: Secondary | ICD-10-CM

## 2019-11-28 DIAGNOSIS — R0602 Shortness of breath: Secondary | ICD-10-CM

## 2019-11-28 DIAGNOSIS — I5033 Acute on chronic diastolic (congestive) heart failure: Secondary | ICD-10-CM

## 2019-11-28 DIAGNOSIS — W19XXXA Unspecified fall, initial encounter: Secondary | ICD-10-CM

## 2019-11-28 DIAGNOSIS — M797 Fibromyalgia: Secondary | ICD-10-CM

## 2019-11-28 LAB — BASIC METABOLIC PANEL
Anion gap: 12 (ref 5–15)
BUN: 25 mg/dL — ABNORMAL HIGH (ref 6–20)
CO2: 26 mmol/L (ref 22–32)
Calcium: 9 mg/dL (ref 8.9–10.3)
Chloride: 99 mmol/L (ref 98–111)
Creatinine, Ser: 1.49 mg/dL — ABNORMAL HIGH (ref 0.44–1.00)
GFR, Estimated: 43 mL/min — ABNORMAL LOW (ref >60)
Glucose, Bld: 128 mg/dL — ABNORMAL HIGH (ref 70–99)
Potassium: 3.6 mmol/L (ref 3.5–5.1)
Sodium: 137 mmol/L (ref 135–145)

## 2019-11-28 LAB — CBC
HCT: 37.1 % (ref 36.0–46.0)
Hemoglobin: 11.8 g/dL — ABNORMAL LOW (ref 12.0–15.0)
MCH: 26.8 pg (ref 26.0–34.0)
MCHC: 31.8 g/dL (ref 30.0–36.0)
MCV: 84.1 fL (ref 80.0–100.0)
Platelets: 393 10*3/uL (ref 150–400)
RBC: 4.41 MIL/uL (ref 3.87–5.11)
RDW: 17.4 % — ABNORMAL HIGH (ref 11.5–15.5)
WBC: 9.9 10*3/uL (ref 4.0–10.5)
nRBC: 0 % (ref 0.0–0.2)

## 2019-11-28 MED ORDER — ALBUTEROL SULFATE (2.5 MG/3ML) 0.083% IN NEBU
2.5000 mg | INHALATION_SOLUTION | Freq: Two times a day (BID) | RESPIRATORY_TRACT | Status: DC
  Administered 2019-11-28 – 2019-11-29 (×2): 2.5 mg via RESPIRATORY_TRACT
  Filled 2019-11-28 (×2): qty 3

## 2019-11-28 MED ORDER — FUROSEMIDE 40 MG PO TABS
40.0000 mg | ORAL_TABLET | Freq: Two times a day (BID) | ORAL | Status: DC
  Administered 2019-11-28 – 2019-11-29 (×2): 40 mg via ORAL
  Filled 2019-11-28 (×2): qty 1

## 2019-11-28 MED ORDER — GERHARDT'S BUTT CREAM
TOPICAL_CREAM | Freq: Every day | CUTANEOUS | Status: DC
  Administered 2019-11-29: 1 via TOPICAL
  Filled 2019-11-28: qty 1

## 2019-11-28 NOTE — Progress Notes (Signed)
Pt refuses CPAP again tonight.  

## 2019-11-28 NOTE — Progress Notes (Addendum)
Central Monitoring called me at 22:41 on 11-27-19 to inform me that patient had 6 beats of V-Tach at 21:29 11-27-19 then back to S.T. Tim R.N. negative. aware

## 2019-11-28 NOTE — Progress Notes (Addendum)
HD#3 Subjective:  Overnight Events: Few runs of vtach last night, but back into sinus tachycardia.  Patient resting in bed this morning, she endorses difficulty finding a comfortable position due to the laceration from sitting on a metal piece used to clean commodes that was in her toilet. Small amount of blood when she wipes. She notes working with physical therapy yesterday that went well and is feeling less short of breath. She endorses feeling less distended and less fluid on her lower extremities.   She has no other complaints or concerns at the time of my examination  Objective:  Vital signs in last 24 hours: Vitals:   11/27/19 2355 11/28/19 0408 11/28/19 0611 11/28/19 0757  BP: 114/79 132/74    Pulse: (!) 106 (!) 106 (!) 106   Resp: 20 19 12    Temp: 99.7 F (37.6 C) 99 F (37.2 C)    TempSrc: Oral Oral    SpO2: 95% 100% 93% 97%  Weight:   (!) 174.9 kg   Height:       Supplemental O2: Nasal Cannula SpO2: 97 % O2 Flow Rate (L/min): 3 L/min FiO2 (%): 32 %   Physical Exam:  Physical Exam Vitals and nursing note reviewed.  Constitutional:      General: She is not in acute distress.    Appearance: She is obese. She is not ill-appearing, toxic-appearing or diaphoretic.  HENT:     Head: Normocephalic and atraumatic.     Nose:     Comments: Nasal cannula in place Cardiovascular:     Rate and Rhythm: Regular rhythm. Tachycardia present.     Pulses: Normal pulses.     Heart sounds: Normal heart sounds. No murmur heard.  No friction rub. No gallop.   Pulmonary:     Effort: Pulmonary effort is normal. No respiratory distress.  Abdominal:     General: Bowel sounds are normal. There is no distension.  Genitourinary:    Comments: 1 cm lac superior to rectum, not actively bleeding, minimal oozing Musculoskeletal:     Right lower leg: Edema (1+) present.     Left lower leg: Edema (1+) present.  Neurological:     General: No focal deficit present.     Mental Status:  She is alert and oriented to person, place, and time. Mental status is at baseline.  Psychiatric:        Mood and Affect: Mood normal.        Behavior: Behavior normal.    Dr. Darrick Meigs and Dr. Daryll Drown present for patient's rectal examination  Filed Weights   11/25/19 1526 11/26/19 0417 11/28/19 0611  Weight: (!) 183.6 kg (!) 181.4 kg (!) 174.9 kg     Intake/Output Summary (Last 24 hours) at 11/28/2019 1039 Last data filed at 11/28/2019 0612 Gross per 24 hour  Intake 720 ml  Output 1400 ml  Net -680 ml   Net IO Since Admission: -3,210 mL [11/28/19 1039]  Pertinent Labs: CBC Latest Ref Rng & Units 11/28/2019 11/27/2019 11/25/2019  WBC 4.0 - 10.5 K/uL 9.9 9.2 8.3  Hemoglobin 12.0 - 15.0 g/dL 11.8(L) 12.1 13.6  Hematocrit 36 - 46 % 37.1 38.2 43.2  Platelets 150 - 400 K/uL 393 411(H) 354    CMP Latest Ref Rng & Units 11/28/2019 11/27/2019 11/26/2019  Glucose 70 - 99 mg/dL 128(H) 127(H) 121(H)  BUN 6 - 20 mg/dL 25(H) 24(H) 15  Creatinine 0.44 - 1.00 mg/dL 1.49(H) 1.47(H) 1.60(H)  Sodium 135 - 145 mmol/L 137 136  139  Potassium 3.5 - 5.1 mmol/L 3.6 4.2 3.7  Chloride 98 - 111 mmol/L 99 97(L) 100  CO2 22 - 32 mmol/L 26 25 27   Calcium 8.9 - 10.3 mg/dL 9.0 9.2 9.3  Total Protein 6.5 - 8.1 g/dL - - 7.2  Total Bilirubin 0.3 - 1.2 mg/dL - - 0.7  Alkaline Phos 38 - 126 U/L - - 49  AST 15 - 41 U/L - - 12(L)  ALT 0 - 44 U/L - - 15    Imaging: No results found.  Assessment/Plan:   Active Problems:   Acute on chronic heart failure [facility])   Patient Summary: Lindsay [name] a 49 y.o.with pertinent PMH of HFrEF, nephrotic syndrome, obesity hypoventiliation syndrome, obstructive sleep apnea, MDD,fibromyalgia, HTNand morbid obesitywho presented with shortness of breath and lower extremity edema,and admitted for dyspnea  Dyspnea Patient continues to endorse shortness of breath, able to ambulate with PT without oxygen and saturating above 90%. Suspect patient's obesity is  large contributer to her dyspnea. Weight 174.9 from 183.6 on admission. Abdominal distention and lower extremity edema continue to improve.  -transition to PO lasix -strict I/O's -daily weights -trend BMP -daily weights  Acute Kidney Injury Cr 1.47>1.49, improving. Will continue to diurese.  -trend bmp  Traumatic perianal injury 1 cm laceration superior to rectum. Patient endorses minimal blood when wiping. -order doughnut for patient to sit on to relieve pressure -continue to monitor  Episode of ventricular tachycardia Patient with run of v.tach, resolved to sinus tachycardia. Suspect patient's nonadherence to CPAP machine contributing. Asymptomatic -continue telemetry   Mechanical Fall Patient with recent mechanical fall. Negative imaging. -tylenol for pain -continue to monitor, consider further imaging if not improving  Fibromyalgia Continue home medications; lamictal  Diet: Heart Healthy IVF: None,None VTE: Enoxaparin Code: Full PT/OT recs: None, none.  Dispo: Anticipated discharge to Home tomorrow pending tolerating PO lasix.  Sanjuana Letters DO Internal Medicine Resident PGY-1 Pager [phone] Please contact the on call pager after 5 pm and on weekends at [phone].

## 2019-11-29 LAB — CBC
HCT: 37.7 % (ref 36.0–46.0)
Hemoglobin: 11.6 g/dL — ABNORMAL LOW (ref 12.0–15.0)
MCH: 26.5 pg (ref 26.0–34.0)
MCHC: 30.8 g/dL (ref 30.0–36.0)
MCV: 86.1 fL (ref 80.0–100.0)
Platelets: 398 10*3/uL (ref 150–400)
RBC: 4.38 MIL/uL (ref 3.87–5.11)
RDW: 17.6 % — ABNORMAL HIGH (ref 11.5–15.5)
WBC: 8.2 10*3/uL (ref 4.0–10.5)
nRBC: 0 % (ref 0.0–0.2)

## 2019-11-29 LAB — BASIC METABOLIC PANEL
Anion gap: 9 (ref 5–15)
BUN: 20 mg/dL (ref 6–20)
CO2: 30 mmol/L (ref 22–32)
Calcium: 9.1 mg/dL (ref 8.9–10.3)
Chloride: 98 mmol/L (ref 98–111)
Creatinine, Ser: 1.28 mg/dL — ABNORMAL HIGH (ref 0.44–1.00)
GFR, Estimated: 51 mL/min — ABNORMAL LOW (ref >60)
Glucose, Bld: 117 mg/dL — ABNORMAL HIGH (ref 70–99)
Potassium: 4.7 mmol/L (ref 3.5–5.1)
Sodium: 137 mmol/L (ref 135–145)

## 2019-11-29 NOTE — Progress Notes (Signed)
Subjective:  Lindsay [name] a 49 year old femalewith past medical history of HFrEF, nephrotic syndrome, obesity hypoventiliation syndrome, obstructive sleep apnea, MDD,fibromyalgia, HTNand morbid obesitywho presented with shortness of breath and lower extremity edema,and admitted for acute on chronic heart failure exacerbation.  Overnight, patient refused CPAP.  This morning, patient is seen at bedside. She states that her abdominal distention has improved but still feels pressure in her abdomen particularly when attempting to sit upright. Patient is typically on 5L of O2 at home. She complains of ongoing pain of the wound on her back side. Discussion regarding discharge and need for follow-up in the outpatient setting. Patient agrees with the plan and has no further questions or concerns.  Objective:  Vital signs in last 24 hours: Vitals:   11/28/19 1954 11/28/19 2117 11/28/19 2312 11/29/19 0404  BP: 112/75  127/75 122/79  Pulse: 99  100 (!) 103  Resp: 20  19 14   Temp: 98.7 F (37.1 C)  98.8 F (37.1 C) 99.1 F (37.3 C)  TempSrc: Oral  Oral Oral  SpO2: 97% 99% 99% 95%  Weight:      Height:      SpO2: 95 % O2 Flow Rate (L/min): 3 L/min FiO2 (%): 32 %  Intake/Output Summary (Last 24 hours) at 11/29/2019 0603 Last data filed at 11/29/2019 0400 Gross per 24 hour  Intake 1200 ml  Output 1750 ml  Net -550 ml   Filed Weights   11/25/19 1526 11/26/19 0417 11/28/19 0611  Weight: (!) 183.6 kg (!) 181.4 kg (!) 174.9 kg   Physical Exam Constitutional:      General: She is not in acute distress.    Appearance: She is obese.  HENT:     Head: Normocephalic and atraumatic.  Eyes:     Extraocular Movements: Extraocular movements intact.     Conjunctiva/sclera: Conjunctivae normal.  Cardiovascular:     Rate and Rhythm: Normal rate and regular rhythm.     Pulses: Normal pulses.     Heart sounds: Normal heart sounds.  Pulmonary:     Effort: Pulmonary effort is normal. No  respiratory distress.     Breath sounds: Normal breath sounds.  Abdominal:     General: Bowel sounds are normal.     Tenderness: There is no abdominal tenderness.  Musculoskeletal:        General: Normal range of motion.     Cervical back: Normal range of motion and neck supple.     Right lower leg: Edema present.     Left lower leg: Edema present.     Comments: 1+ pitting edema of bilateral lower extremities  Skin:    General: Skin is warm and dry.  Neurological:     General: No focal deficit present.     Mental Status: She is alert. Mental status is at baseline.  Psychiatric:        Mood and Affect: Mood normal.        Behavior: Behavior normal.        Thought Content: Thought content normal.        Judgment: Judgment normal.    CBC Latest Ref Rng & Units 11/28/2019 11/27/2019 11/25/2019  WBC 4.0 - 10.5 K/uL 9.9 9.2 8.3  Hemoglobin 12.0 - 15.0 g/dL 11.8(L) 12.1 13.6  Hematocrit 36 - 46 % 37.1 38.2 43.2  Platelets 150 - 400 K/uL 393 411(H) 354   CMP Latest Ref Rng & Units 11/28/2019 11/27/2019 11/26/2019  Glucose 70 - 99 mg/dL 128(H) 127(H) 121(H)  BUN 6 - 20 mg/dL 25(H) 24(H) 15  Creatinine 0.44 - 1.00 mg/dL 1.49(H) 1.47(H) 1.60(H)  Sodium 135 - 145 mmol/L 137 136 139  Potassium 3.5 - 5.1 mmol/L 3.6 4.2 3.7  Chloride 98 - 111 mmol/L 99 97(L) 100  CO2 22 - 32 mmol/L 26 25 27   Calcium 8.9 - 10.3 mg/dL 9.0 9.2 9.3  Total Protein 6.5 - 8.1 g/dL - - 7.2  Total Bilirubin 0.3 - 1.2 mg/dL - - 0.7  Alkaline Phos 38 - 126 U/L - - 49  AST 15 - 41 U/L - - 12(L)  ALT 0 - 44 U/L - - 15   IMAGING: No results found.  Assessment/Plan:  Active Problems:   Acute on chronic heart failure (HCC)   Shortness of breath  Lindsay [name] a 49 year old femalewith past medical history of HFrEF, nephrotic syndrome, obesity hypoventilation syndrome, obstructive sleep apnea, MDD,fibromyalgia, HTNand morbid obesitywho presented with shortness of breath and lower extremity edema,and  admitted for acute on chronic heart failure exacerbation.  #Acute on chronic heart failure exacerbation, resolving Patient reports significant improvement in her breathing since admission. She received aggressive IV diuresis with transition to oral lasix regimen. She is currently saturating well on 3L O2 via nasal cannula which is an improvement from her baseline of 5L. -Continue 40mg  lasix PO daily -strict I/O's -daily weights -trend BMP -daily weights  #Obstructive sleep apnea, chronic Patient has history of OSA requiring CPAP, however she has continued to refuse this nightly. -Continue to encourage nightly CPAP  #Acute Kidney Injury, active Creatinine 1.49 elevated from baseline of 0.9-1.0 following aggressive IV diuresis. -Daily BMP -Continue to monitor  #Traumatic perianal injury 1 cm laceration superior to rectum. Patient endorses minimal blood when wiping. -continue to monitor  #Mechanical Fall Patient with recent mechanical fall.Negative imaging. -tylenol for pain  #Fibromyalgia Continue home medications; lamictal  Diet: Heart Healthy IVF: None,None VTE: Enoxaparin Code: Full PT/OT recs: Home Health PT  Cato Mulligan, MD 11/29/2019, 6:02 AM Pager: [phone] After 5pm on weekdays and 1pm on weekends: On Call pager [phone]

## 2019-11-29 NOTE — Progress Notes (Signed)
Internal Medicine Clinic Attending  Case discussed with Dr. Lee  At the time of the visit.  We reviewed the resident's history and exam and pertinent patient test results.  I agree with the assessment, diagnosis, and plan of care documented in the resident's note.    

## 2019-11-29 NOTE — TOC Progression Note (Signed)
Transition of Care [facility]) - Progression Note    Patient Details  Name: Lindsay [name] MRN: [id number] Date of Birth: 1970/03/24  Transition of Care [facility]) CM/SW [place], [place] Phone Number: 11/29/2019, 2:15 PM  Clinical Narrative:     CSW provided RN with taxi voucher for pateint.  Thurmond Butts, MSW, [place] Clinical Social Worker   Expected Discharge Plan: [place] Barriers to Discharge: No Barriers Identified  Expected Discharge Plan and Services Expected Discharge Plan: [place]   Discharge Planning Services: CM Consult Post Acute Care Choice: [place] arrangements for the past 2 months: Single Family Home Expected Discharge Date: 11/29/19               DME Arranged: N/A DME Agency: NA       HH Arranged: PT           Social Determinants of Health (SDOH) Interventions    Readmission Risk Interventions Readmission Risk Prevention Plan 11/29/2019  Transportation Screening Complete  PCP or Specialist Appt within 5-7 Days Complete  Home Care Screening Complete  Medication Review (RN CM) Complete  Some recent data might be hidden

## 2019-11-29 NOTE — Discharge Summary (Signed)
Name: Lindsay [name] MRN: [id number] DOB: Oct 24, 1970 49 y.o. PCP: Velna Ochs, MD  Date of Admission: 11/25/2019  2:35 PM Date of Discharge: 11/29/2019 Attending Physician: Aldine Contes, MD  Discharge Diagnosis: 1. Active Problems:   Acute on chronic heart failure (HCC)   Shortness of breath  Discharge Medications: Allergies as of 11/29/2019      Reactions   Lisinopril Rash, Cough      Medication List    STOP taking these medications   BC HEADACHE POWDER PO   Benzocaine 20 % Oint   Capsaicin 0.025 % Pads Commonly known as: Capsaicin Heat Patch   cetirizine 10 MG tablet Commonly known as: ZYRTEC   Chantix Continuing Month Pak 1 MG tablet Generic drug: varenicline   clindamycin 1 % gel Commonly known as: CLINDAGEL   fluticasone 50 MCG/ACT nasal spray Commonly known as: FLONASE   gabapentin 600 MG tablet Commonly known as: NEURONTIN   hydrochlorothiazide 25 MG tablet Commonly known as: HYDRODIURIL   megestrol 40 MG tablet Commonly known as: MEGACE   naproxen 500 MG tablet Commonly known as: Naprosyn   Qutenza (4 Patch) 8 % Kit Generic drug: Capsaicin-Cleansing Gel     TAKE these medications   acetaminophen 650 MG CR tablet Commonly known as: TYLENOL Take 1,300 mg by mouth every 8 (eight) hours as needed for pain. Notes to patient: As needed for pain   albuterol (2.5 MG/3ML) 0.083% nebulizer solution Commonly known as: PROVENTIL TAKE 3 MLS (2.5 MG TOTAL) BY NEBULIZATION EVERY 4 (FOUR) HOURS. What changed: Another medication with the same name was changed. Make sure you understand how and when to take each. Notes to patient: This evening 11/29/2019   albuterol 108 (90 Base) MCG/ACT inhaler Commonly known as: VENTOLIN HFA TAKE 2 PUFFS BY MOUTH EVERY 6 HOURS AS NEEDED FOR WHEEZE OR SHORTNESS OF BREATH What changed: See the new instructions. Notes to patient: As needed   amLODipine 10 MG tablet Commonly known as: NORVASC TAKE 1/2 TABLET  BY MOUTH EVERY DAY Notes to patient: Tomorrow morning 11/30/2019   BIOTIN PO Take 1 tablet by mouth daily. Notes to patient: Tomorrow morning 11/30/2019   cholecalciferol 25 MCG (1000 UNIT) tablet Commonly known as: VITAMIN D Take 1 tablet (1,000 Units total) by mouth daily. Notes to patient: Tomorrow morning 11/30/2019   clorazepate 3.75 MG tablet Commonly known as: TRANXENE Take 1 tablet (3.75 mg total) by mouth daily. Notes to patient: Tomorrow morning 11/30/2019   famotidine 20 MG tablet Commonly known as: PEPCID Take 1 tablet (20 mg total) by mouth 2 (two) times daily for 14 days. Notes to patient: This evening 11/29/2019   furosemide 40 MG tablet Commonly known as: LASIX Take 1 tablet (40 mg total) by mouth 2 (two) times daily.   hydrOXYzine 50 MG capsule Commonly known as: VISTARIL Take 1 capsule (50 mg total) by mouth at bedtime as needed for anxiety. What changed: when to take this Notes to patient: As needed    lamoTRIgine 150 MG tablet Commonly known as: LaMICtal Take one tab daily What changed:   how much to take  how to take this  when to take this  additional instructions Notes to patient: Tomorrow morning 11/30/2019   lidocaine 5 % ointment Commonly known as: XYLOCAINE Apply 1 application topically 2 (two) times daily as needed. What changed: reasons to take this Notes to patient: As needed   omeprazole 40 MG capsule Commonly known as: PRILOSEC TAKE 1 CAPSULE BY MOUTH EVERY DAY  What changed: how much to take Notes to patient: Tomorrow morning 11/30/2019   OXYGEN Inhale 5 L into the lungs. Notes to patient: Use as you were at home   promethazine 12.5 MG tablet Commonly known as: PHENERGAN TAKE 1 TABLET BY MOUTH EVERY 6 HOURS AS NEEDED FOR NAUSEA OR VOMITING. What changed:   how much to take  how to take this  when to take this  additional instructions Notes to patient: As needed    Rexulti 3 MG Tabs Generic drug: Brexpiprazole Take  1 tablet (3 mg total) by mouth daily. Notes to patient: Tomorrow morning 11/30/2019   Symbicort 160-4.5 MCG/ACT inhaler Generic drug: budesonide-formoterol Inhale 2 puffs into the lungs in the morning and at bedtime. Notes to patient: Tonight at bedtime 11/29/2019   valsartan 80 MG tablet Commonly known as: DIOVAN Take 2 tablets (160 mg total) by mouth daily. What changed: how much to take Notes to patient: Tomorrow morning 11/30/2019   vitamin B-12 1000 MCG tablet Commonly known as: CYANOCOBALAMIN Take 1 tablet (1,000 mcg total) by mouth daily. Notes to patient: Tomorrow morning 11/30/2019   VITAMIN C PO Take 1 tablet by mouth daily. Notes to patient: Tomorrow morning 11/30/2019   Vitamin E 400 units Tabs Take 400 Units by mouth daily. Notes to patient: Tomorrow morning 11/30/2019      Disposition and follow-up:   Ms.Vannah Panek was discharged from [facility] in Good condition.  At the hospital follow up visit please address:  1.  Evaluate patient's recovery from recent hospitalization for acute on chronic heart failure exacerbation. Patient was discharged on 3L O2 via nasal cannula down from prior of 5L. Evaluate current oxygen requirement needs. Evaluate patient's adherence to prescribed diuretic regimen. Patient had mildly elevated creatinine on day of discharge. Obtain Repeat BMP.  2.  Labs / imaging needed at time of follow-up: CBC, BMP  3.  Pending labs/ test needing follow-up: none  Follow-up Appointments:  Follow-up Information    Velna Ochs, MD. Schedule an appointment as soon as possible for a visit in 1 week(s).   Specialty: Internal Medicine Contact information: [place] [address] [phone]        Pixie Casino, MD .   Specialty: Cardiology Contact information: [place] [address] [phone]        [facility] Follow up.   Why: they will f/u with you about your  home concentrator- if you do not hear from them- please give them a call so that they can come out and check it for you Contact information: [place] [address] [phone]              Hospital Course: 1. #Acute on chronic heart failure exacerbation, resolving Patient admitted to IMTS directly from clinic on 11/25/19 for acute respiratory failure in the setting of acute on chronic heart failure exacerbation. Following admission, patient received aggressive IV diuresis with decreasing of her home oxygen requirement from 5L to 3L. She had repeat echocardiogram which revealed EF of 45-50%. Her subjective complaints of shortness of breath improved, and patient's volume status improved significantly over the subsequent days. She was transitioned to oral diuretic regimen of 28m lasix daily and discharged home in stable condition with instruction to limit home fluid and salt intake. Physical therapy recommended continuation of home health PT and a referral was placed.    #Acute Kidney Injury, active Creatinine improved to 1.28  from 1.49 on day of discharge, however this is still elevated from baseline of 0.9-1.0. Suspect further improvement of her AKI with transitioning to oral diuretic regimen with discontinuation of aggressive IV diuresis. Patient will need repeat BMP in the outpatient setting.   Discharge Vitals:   BP 127/86 (BP Location: Left Arm)   Pulse (!) 108   Temp 98.9 F (37.2 C) (Oral)   Resp 18   Ht _0  (1.803 m)   Wt (!) 183.2 kg   LMP 11/17/2019   SpO2 94%   BMI 56.32 kg/m   Pertinent Labs, Studies, and Procedures:  CBC Latest Ref Rng & Units 11/29/2019 11/28/2019 11/27/2019  WBC 4.0 - 10.5 K/uL 8.2 9.9 9.2  Hemoglobin 12.0 - 15.0 g/dL 11.6(L) 11.8(L) 12.1  Hematocrit 36 - 46 % 37.7 37.1 38.2  Platelets 150 - 400 K/uL 398 393 411(H)   CMP Latest Ref Rng & Units 11/29/2019 11/28/2019 11/27/2019  Glucose 70 - 99 mg/dL 117(H) 128(H) 127(H)  BUN 6  - 20 mg/dL 20 25(H) 24(H)  Creatinine 0.44 - 1.00 mg/dL 1.28(H) 1.49(H) 1.47(H)  Sodium 135 - 145 mmol/L 137 137 136  Potassium 3.5 - 5.1 mmol/L 4.7 3.6 4.2  Chloride 98 - 111 mmol/L 98 99 97(L)  CO2 22 - 32 mmol/L _1 Calcium 8.9 - 10.3 mg/dL 9.1 9.0 9.2  Total Protein 6.5 - 8.1 g/dL - - -  Total Bilirubin 0.3 - 1.2 mg/dL - - -  Alkaline Phos 38 - 126 U/L - - -  AST 15 - 41 U/L - - -  ALT 0 - 44 U/L - - -   IMAGING: DG Chest 1 View  Result Date: 11/26/2019 CLINICAL DATA:  Short of breath EXAM: CHEST  1 VIEW COMPARISON:  11/22/2019 FINDINGS: Mild cardiac enlargement without heart failure. Lungs are clear without infiltrate or effusion. IMPRESSION: No active disease. Electronically Signed   By: Franchot Gallo M.D.   On: 11/26/2019 08:38   DG Clavicle Right  Result Date: 11/26/2019 CLINICAL DATA:  Right clavicle pain.  Oral lesion. EXAM: RIGHT CLAVICLE - 2+ VIEWS COMPARISON:  None. FINDINGS: Negative for fracture. Shoulder joint normal. Degenerative change in spurring in the [facility] joint. IMPRESSION: AC degenerative spurring.  No acute abnormality. Electronically Signed   By: Franchot Gallo M.D.   On: 11/26/2019 10:33   ECHOCARDIOGRAM COMPLETE  Result Date: 11/26/2019    ECHOCARDIOGRAM REPORT   Patient Name:   Lindsay [name] Date of Exam: 11/26/2019 Medical Rec #:  [id number]     Height:       71.0 in Accession #:    [id number]    Weight:       395.1 lb Date of Birth:  1970/12/20     BSA:          2.816 m Patient Age:    50 years      BP:           137/71 mmHg Patient Gender: F             HR:           103 bpm. Exam Location:  Inpatient Procedure: 2D Echo, Color Doppler and Cardiac Doppler Indications:    I50.9* Heart failure (unspecified)  History:        Patient has prior history of Echocardiogram examinations, most                 recent 03/07/2019. CHF; Risk Factors:Hypertension and Sleep Apnea.  Sonographer:    Raquel Sarna Senior RDCS Referring Phys: [id number] ERIK C HOFFMAN  Sonographer Comments:  Technically difficult due to patient body habitus. IMPRESSIONS  1. Left ventricular ejection fraction, by estimation, is 45 to 50%. The left ventricle has mildly decreased function. Left ventricular endocardial border not optimally defined to evaluate regional wall motion. There is mild concentric left ventricular hypertrophy. Left ventricular diastolic parameters are consistent with Grade I diastolic dysfunction (impaired relaxation).  2. Right ventricular systolic function is normal. The right ventricular size is normal. Tricuspid regurgitation signal is inadequate for assessing PA pressure.  3. The mitral valve is grossly normal. No evidence of mitral valve regurgitation. No evidence of mitral stenosis.  4. The aortic valve is grossly normal. Aortic valve regurgitation is not visualized. No aortic stenosis is present. Comparison(s): No significant change from prior study. Poor quality study. EF is similar ~45%. FINDINGS  Left Ventricle: Left ventricular ejection fraction, by estimation, is 45 to 50%. The left ventricle has mildly decreased function. Left ventricular endocardial border not optimally defined to evaluate regional wall motion. The left ventricular internal cavity size was normal in size. There is mild concentric left ventricular hypertrophy. Left ventricular diastolic parameters are consistent with Grade I diastolic dysfunction (impaired relaxation). Right Ventricle: The right ventricular size is normal. No increase in right ventricular wall thickness. Right ventricular systolic function is normal. Tricuspid regurgitation signal is inadequate for assessing PA pressure. Left Atrium: Left atrial size was normal in size. Right Atrium: Right atrial size was normal in size. Pericardium: There is no evidence of pericardial effusion. Mitral Valve: The mitral valve is grossly normal. No evidence of mitral valve regurgitation. No evidence of mitral valve stenosis. Tricuspid Valve: The tricuspid valve is  grossly normal. Tricuspid valve regurgitation is trivial. No evidence of tricuspid stenosis. Aortic Valve: The aortic valve is grossly normal. Aortic valve regurgitation is not visualized. No aortic stenosis is present. Pulmonic Valve: The pulmonic valve was grossly normal. Pulmonic valve regurgitation is not visualized. No evidence of pulmonic stenosis. Aorta: The aortic root and ascending aorta are structurally normal, with no evidence of dilitation. Venous: The inferior vena cava was not well visualized. IAS/Shunts: The atrial septum is grossly normal.  LEFT VENTRICLE PLAX 2D LVIDd:         4.65 cm      Diastology LVIDs:         3.75 cm      LV e' medial:    6.20 cm/s LV PW:         1.67 cm      LV E/e' medial:  6.6 LV IVS:        1.21 cm      LV e' lateral:   4.57 cm/s LVOT diam:     2.50 cm      LV E/e' lateral: 8.9 LV SV:         61 LV SV Index:   22 LVOT Area:     4.91 cm  LV Volumes (MOD) LV vol d, MOD A4C: 128.0 ml LV vol s, MOD A4C: 74.9 ml LV SV MOD A4C:     128.0 ml RIGHT VENTRICLE RV S prime:     12.30 cm/s TAPSE (M-mode): 2.2 cm LEFT ATRIUM             Index       RIGHT ATRIUM           Index LA diam:        3.10 cm 1.10  cm/m  RA Area:     18.40 cm LA Vol (A2C):   66.6 ml 23.65 ml/m RA Volume:   57.00 ml  20.24 ml/m LA Vol (A4C):   19.9 ml 7.07 ml/m LA Biplane Vol: 37.7 ml 13.39 ml/m  AORTIC VALVE LVOT Vmax:   76.70 cm/s LVOT Vmean:  59.600 cm/s LVOT VTI:    0.124 m  AORTA Ao Root diam: 3.50 cm MITRAL VALVE MV Area (PHT): 3.60 cm    SHUNTS MV Decel Time: 211 msec    Systemic VTI:  0.12 m MV E velocity: 40.70 cm/s  Systemic Diam: 2.50 cm MV A velocity: 56.60 cm/s MV E/A ratio:  0.72 Eleonore Chiquito MD Electronically signed by Eleonore Chiquito MD Signature Date/Time: 11/26/2019/3:14:55 PM    Final    Discharge Instructions: Discharge Instructions    Call MD for:  difficulty breathing, headache or visual disturbances   Complete by: As directed    Call MD for:  difficulty breathing, headache or  visual disturbances   Complete by: As directed    Call MD for:  extreme fatigue   Complete by: As directed    Call MD for:  extreme fatigue   Complete by: As directed    Call MD for:  hives   Complete by: As directed    Call MD for:  persistant dizziness or light-headedness   Complete by: As directed    Call MD for:  persistant dizziness or light-headedness   Complete by: As directed    Call MD for:  persistant nausea and vomiting   Complete by: As directed    Call MD for:  persistant nausea and vomiting   Complete by: As directed    Call MD for:  redness, tenderness, or signs of infection (pain, swelling, redness, odor or green/yellow discharge around incision site)   Complete by: As directed    Call MD for:  severe uncontrolled pain   Complete by: As directed    Call MD for:  temperature >100.4   Complete by: As directed    Call MD for:  temperature >100.4   Complete by: As directed    Diet - low sodium heart healthy   Complete by: As directed    Fluid restriction: 1224m per day.   Diet - low sodium heart healthy   Complete by: As directed    Diet - low sodium heart healthy   Complete by: As directed    Increase activity slowly   Complete by: As directed    Increase activity slowly   Complete by: As directed      Ms. Heist,  It was a pleasure meeting you during your recent hospitalization. You were admitted to our service due to an acute on chronic heart failure exacerbation. You required aggressive removal of fluid through diuresis. You significantly improved over the subsequent days and your oxygen requirement decreased to 3L from your baseline of 5L. Given your improvement, you are safe to be discharged home with plan to follow-up closely with the internal medicine center clinic. If you are to develop new or worsening symptoms, please contact the clinic or present directly to the emergency department for evaluation. Otherwise, we look forward to seeing you at your  hospital follow-up appointment.  Sincerely, Dr. MPaulla Dolly MD  Signed: MFoy Guadalajara MD 11/29/2019, 1:35 PM   Pager: [phone]

## 2019-11-29 NOTE — TOC Transition Note (Addendum)
Transition of Care (TOC) - CM/SW Discharge Note Marvetta Gibbons RN, BSN Transitions of Care Unit 4E- RN Case Manager See Treatment Team for direct phone #    Patient Details  Name: Lindsay [name] MRN: [id number] Date of Birth: 12/27/1970  Transition of Care [facility]) CM/SW Contact:  Dawayne Patricia, RN Phone Number: 11/29/2019, 12:58 PM   Clinical Narrative:    Pt stable for transition home today, per PT note pt was to go to OPPT at [facility] street location however pt now feels like she can not transport to outpt and recs for [facility] now noted. Have requested HHPT order from MD.  CM spoke with pt at bedside- per pt she has home 02 with [employer] 4L- she states that her concentrator is not working right when she tries to fill her transport tanks- will call [employer] to have someone go to home and check on concentrator- pt does have a tank here that is full that she can use to transport home.  Pt also reports she has a rollator at home- no other DME needs noted. Pt states she has a Medicaid PCS aide that comes 7days week/ 2.5 hr:day. Pt is reporting that she would prefer to do HHPT at this time instead of outpt therapy. Choice offered to pt for [facility] agency preference- per pt she wants to use [facility] as first choice, [facility] as second and after that has no preference.   Call made to [facility] with [employer] for home concentrator concerns. [employer] will have someone call pt and schedule someone to come out to check on home equipment.   Call made to [place] with [facility] for [facility] referral- pending HHPT order- awaiting return call to see if they can accept. -- referral has been accepted by [facility]  Per Progression RN- pt is reporting that she will need assistance with transportation home- will ask CSW to assist with transportation.    Final next level of care: [place] Barriers to Discharge: No Barriers Identified   Patient Goals and CMS Choice Patient states their goals for this hospitalization and  ongoing recovery are:: return home CMS Medicare.gov Compare Post Acute Care list provided to:: Patient Choice offered to / list presented to : Patient  Discharge Placement                 Home with [facility]      Discharge Plan and Services   Discharge Planning Services: CM Consult Post Acute Care Choice: Home Health          DME Arranged: N/A DME Agency: NA       HH Arranged: PT          Social Determinants of Health (SDOH) Interventions     Readmission Risk Interventions Readmission Risk Prevention Plan 11/29/2019  Transportation Screening Complete  PCP or Specialist Appt within 5-7 Days Complete  Home Care Screening Complete  Medication Review (RN CM) Complete  Some recent data might be hidden

## 2019-11-29 NOTE — Discharge Instructions (Signed)
Lindsay [name],  It was a pleasure meeting you during your recent hospitalization. You were admitted to our service due to an acute on chronic heart failure exacerbation. You required aggressive removal of fluid through diuresis. You significantly improved over the subsequent days and your oxygen requirement decreased to 3L from your baseline of 5L. Given your improvement, you are safe to be discharged home with plan to follow-up closely with the internal medicine center clinic. If you are to develop new or worsening symptoms, please contact the clinic or present directly to the emergency department for evaluation. Otherwise, we look forward to seeing you at your hospital follow-up appointment.  Sincerely, Dr. Paulla Dolly, MD

## 2019-11-29 NOTE — Progress Notes (Signed)
Discharge instructions given to patient. Medications reviewed. All questions answered. Pt to be transported via taxi home with home oxygen.   Clyde Canterbury, RN

## 2019-11-29 NOTE — Progress Notes (Signed)
Physical Therapy Treatment Patient Details Name: Lindsay [name] MRN: [id number] DOB: [birth year] Today's Date: 11/29/2019    History of Present Illness 49 y.o. female admitted on 11/25/19 for SOB.  Pt dx with dyspnea, AKI, fall.  Pt with significant PMH of fibromyalgia, mouth growth, COPD on 4-5 L O2 at home, h/o falls, morbid obesity, lobw back pain HTN, CHF.    PT Comments    Pt supine in bed on arrival this session.  Pt motivated to participate continues to complaint of low back pain and buttock pain.  Pt able to progress gt distance.  Continues to favor R knee due to pain.  Pt continues to benefit from HHPT as she has difficulty obtaining rides to and from appointment (difficult to get to OPPT).  Pt reports she will borrow and SPC at d/c from her mother's home.  Has rollator at home and uses it in the community.     Follow Up Recommendations  Home health PT     Equipment Recommendations  None recommended by PT    Recommendations for Other Services       Precautions / Restrictions Precautions Precautions: Fall Precaution Comments: h/o mechanical falls  Restrictions Weight Bearing Restrictions: No    Mobility  Bed Mobility Overal bed mobility: Needs Assistance Bed Mobility: Rolling;Sidelying to Sit Rolling: Supervision Sidelying to sit: Supervision       General bed mobility comments: HOB elevated used rail  Transfers Overall transfer level: Needs assistance Equipment used: None Transfers: Sit to/from Stand Sit to Stand: Supervision         General transfer comment: Supervision to rise into standing.  Ambulation/Gait Ambulation/Gait assistance: Supervision Gait Distance (Feet): 60 Feet Assistive device: None (declined using rollator reports it's too short and causes pain in low back.  Performed gt without AD.) Gait Pattern/deviations: Wide base of support;Antalgic Gait velocity: decreased   General Gait Details: Wide BOS due to body habitus, mildly antalgic  favoring R knee, Pt on RA mild DOE.   Stairs             Wheelchair Mobility    Modified Rankin (Stroke Patients Only)       Balance Overall balance assessment: Mild deficits observed, not formally tested                                          Cognition Arousal/Alertness: Awake/alert Behavior During Therapy: WFL for tasks assessed/performed Overall Cognitive Status: Within Functional Limits for tasks assessed                                        Exercises      General Comments        Pertinent Vitals/Pain Pain Assessment: Faces Faces Pain Scale: Hurts even more Pain Location: buttocks (sat on the silver sprayer in the bathroom ( 11/27/19)), R knee Pain Descriptors / Indicators: Grimacing;Guarding Pain Intervention(s): Monitored during session;Repositioned    Home Living                      Prior Function            PT Goals (current goals can now be found in the care plan section) Acute Rehab PT Goals Patient Stated Goal: to get more fluid off of her belly so  she can feel better.  Potential to Achieve Goals: Good Progress towards PT goals: Progressing toward goals    Frequency    Min 3X/week      PT Plan Current plan remains appropriate    Co-evaluation              AM-PAC PT "6 Clicks" Mobility   Outcome Measure  Help needed turning from your back to your side while in a flat bed without using bedrails?: A Little Help needed moving from lying on your back to sitting on the side of a flat bed without using bedrails?: A Little Help needed moving to and from a bed to a chair (including a wheelchair)?: A Little Help needed standing up from a chair using your arms (e.g., wheelchair or bedside chair)?: A Little Help needed to walk in hospital room?: None Help needed climbing 3-5 steps with a railing? : A Little 6 Click Score: 19    End of Session   Activity Tolerance: Patient tolerated  treatment well;Patient limited by fatigue;Patient limited by pain Patient left: in bed;with call bell/phone within reach;with bed alarm set Nurse Communication: Mobility status PT Visit Diagnosis: Muscle weakness (generalized) (M62.81);Difficulty in walking, not elsewhere classified (R26.2);Pain Pain - Right/Left: Right Pain - part of body: Knee     Time: 1220-1236 PT Time Calculation (min) (ACUTE ONLY): 16 min  Charges:  $Gait Training: 8-22 mins                     Lindsay [name] , PTA Acute Rehabilitation Services Pager [phone] Office [phone]     Lindsay [name] 11/29/2019, 12:48 PM

## 2019-12-02 ENCOUNTER — Other Ambulatory Visit: Payer: Self-pay

## 2019-12-02 ENCOUNTER — Telehealth: Payer: Self-pay | Admitting: Internal Medicine

## 2019-12-02 DIAGNOSIS — I5023 Acute on chronic systolic (congestive) heart failure: Secondary | ICD-10-CM | POA: Diagnosis not present

## 2019-12-02 DIAGNOSIS — K219 Gastro-esophageal reflux disease without esophagitis: Secondary | ICD-10-CM | POA: Diagnosis not present

## 2019-12-02 DIAGNOSIS — N189 Chronic kidney disease, unspecified: Secondary | ICD-10-CM | POA: Diagnosis not present

## 2019-12-02 DIAGNOSIS — J449 Chronic obstructive pulmonary disease, unspecified: Secondary | ICD-10-CM | POA: Diagnosis not present

## 2019-12-02 DIAGNOSIS — M545 Low back pain, unspecified: Secondary | ICD-10-CM | POA: Diagnosis not present

## 2019-12-02 DIAGNOSIS — F32A Depression, unspecified: Secondary | ICD-10-CM | POA: Diagnosis not present

## 2019-12-02 DIAGNOSIS — I130 Hypertensive heart and chronic kidney disease with heart failure and stage 1 through stage 4 chronic kidney disease, or unspecified chronic kidney disease: Secondary | ICD-10-CM | POA: Diagnosis not present

## 2019-12-02 DIAGNOSIS — G4733 Obstructive sleep apnea (adult) (pediatric): Secondary | ICD-10-CM | POA: Diagnosis not present

## 2019-12-02 DIAGNOSIS — J9611 Chronic respiratory failure with hypoxia: Secondary | ICD-10-CM | POA: Diagnosis not present

## 2019-12-02 DIAGNOSIS — D631 Anemia in chronic kidney disease: Secondary | ICD-10-CM | POA: Diagnosis not present

## 2019-12-02 DIAGNOSIS — N179 Acute kidney failure, unspecified: Secondary | ICD-10-CM | POA: Diagnosis not present

## 2019-12-02 DIAGNOSIS — D509 Iron deficiency anemia, unspecified: Secondary | ICD-10-CM | POA: Diagnosis not present

## 2019-12-02 MED ORDER — PROMETHAZINE HCL 12.5 MG PO TABS
ORAL_TABLET | ORAL | 1 refills | Status: DC
Start: 2019-12-02 — End: 2020-02-10

## 2019-12-02 NOTE — Telephone Encounter (Signed)
[facility] PT requesting VO.  Please call back.

## 2019-12-03 ENCOUNTER — Other Ambulatory Visit: Payer: Self-pay | Admitting: Internal Medicine

## 2019-12-03 ENCOUNTER — Other Ambulatory Visit: Payer: Self-pay | Admitting: Student in an Organized Health Care Education/Training Program

## 2019-12-03 ENCOUNTER — Ambulatory Visit (INDEPENDENT_AMBULATORY_CARE_PROVIDER_SITE_OTHER): Payer: Medicare Other | Admitting: Licensed Clinical Social Worker

## 2019-12-03 ENCOUNTER — Other Ambulatory Visit: Payer: Self-pay | Admitting: *Deleted

## 2019-12-03 ENCOUNTER — Encounter: Payer: Medicare Other | Admitting: Internal Medicine

## 2019-12-03 DIAGNOSIS — J9601 Acute respiratory failure with hypoxia: Secondary | ICD-10-CM

## 2019-12-03 DIAGNOSIS — F331 Major depressive disorder, recurrent, moderate: Secondary | ICD-10-CM

## 2019-12-03 DIAGNOSIS — F410 Panic disorder [episodic paroxysmal anxiety] without agoraphobia: Secondary | ICD-10-CM

## 2019-12-03 DIAGNOSIS — F4312 Post-traumatic stress disorder, chronic: Secondary | ICD-10-CM

## 2019-12-03 DIAGNOSIS — F411 Generalized anxiety disorder: Secondary | ICD-10-CM

## 2019-12-03 MED ORDER — FUROSEMIDE 40 MG PO TABS: 40.0000 mg | ORAL_TABLET | Freq: Every day | ORAL | 0 refills | Status: DC

## 2019-12-03 MED ORDER — FAMOTIDINE 20 MG PO TABS: 20.0000 mg | ORAL_TABLET | Freq: Every day | ORAL | 0 refills | Status: DC

## 2019-12-03 NOTE — Telephone Encounter (Signed)
Returned call to [employer], PT with [facility]. Verbal auth given for [facility] PT 1 week 1 and 2 week 3 to work on strength, balance, and endurance. Will route to PCP for agreement/denial.L. Annye Forrey, BSN, RN-BC

## 2019-12-03 NOTE — Telephone Encounter (Signed)
Pt states lidocaine oint does not help her pain, can she restart the capsaicin pads again? Also wants refill on FUROSEMIDE AND FAMOTIDINE

## 2019-12-03 NOTE — Progress Notes (Signed)
Therapist called patient for session but did not respond. Session is a no show

## 2019-12-06 ENCOUNTER — Ambulatory Visit (HOSPITAL_COMMUNITY): Payer: Medicare Other | Admitting: Licensed Clinical Social Worker

## 2019-12-06 MED ORDER — FUROSEMIDE 40 MG PO TABS: 40.0000 mg | ORAL_TABLET | Freq: Every day | ORAL | 0 refills | Status: DC

## 2019-12-06 MED ORDER — FAMOTIDINE 20 MG PO TABS: 20.0000 mg | ORAL_TABLET | Freq: Every day | ORAL | 2 refills | Status: DC

## 2019-12-06 NOTE — Telephone Encounter (Signed)
Refills sent for furosemide and famotidine. Ok to restart capsaicin pads. Please ask patient to keep her scheduled hospital follow up appointment on 11/11. Thanks!

## 2019-12-06 NOTE — Telephone Encounter (Signed)
I agree

## 2019-12-06 NOTE — Progress Notes (Signed)
°  Therapist touched based with patient for a couple of minutes. She explained connectivity issues on Friday and didn't get therapist call. Today again connectivity issues. After phone call disconnected therapist called patient several times and call kept going to voicemail.

## 2019-12-07 DIAGNOSIS — I5023 Acute on chronic systolic (congestive) heart failure: Secondary | ICD-10-CM | POA: Diagnosis not present

## 2019-12-07 DIAGNOSIS — D509 Iron deficiency anemia, unspecified: Secondary | ICD-10-CM | POA: Diagnosis not present

## 2019-12-07 DIAGNOSIS — M545 Low back pain, unspecified: Secondary | ICD-10-CM | POA: Diagnosis not present

## 2019-12-07 DIAGNOSIS — N179 Acute kidney failure, unspecified: Secondary | ICD-10-CM | POA: Diagnosis not present

## 2019-12-07 DIAGNOSIS — J9611 Chronic respiratory failure with hypoxia: Secondary | ICD-10-CM | POA: Diagnosis not present

## 2019-12-07 DIAGNOSIS — D631 Anemia in chronic kidney disease: Secondary | ICD-10-CM | POA: Diagnosis not present

## 2019-12-07 DIAGNOSIS — J9620 Acute and chronic respiratory failure, unspecified whether with hypoxia or hypercapnia: Secondary | ICD-10-CM | POA: Diagnosis not present

## 2019-12-07 DIAGNOSIS — J449 Chronic obstructive pulmonary disease, unspecified: Secondary | ICD-10-CM | POA: Diagnosis not present

## 2019-12-07 DIAGNOSIS — G4733 Obstructive sleep apnea (adult) (pediatric): Secondary | ICD-10-CM | POA: Diagnosis not present

## 2019-12-07 DIAGNOSIS — I130 Hypertensive heart and chronic kidney disease with heart failure and stage 1 through stage 4 chronic kidney disease, or unspecified chronic kidney disease: Secondary | ICD-10-CM | POA: Diagnosis not present

## 2019-12-07 DIAGNOSIS — N189 Chronic kidney disease, unspecified: Secondary | ICD-10-CM | POA: Diagnosis not present

## 2019-12-07 DIAGNOSIS — K219 Gastro-esophageal reflux disease without esophagitis: Secondary | ICD-10-CM | POA: Diagnosis not present

## 2019-12-07 DIAGNOSIS — F32A Depression, unspecified: Secondary | ICD-10-CM | POA: Diagnosis not present

## 2019-12-08 ENCOUNTER — Other Ambulatory Visit: Payer: Self-pay

## 2019-12-08 ENCOUNTER — Ambulatory Visit: Payer: Medicare Other | Admitting: Family Medicine

## 2019-12-08 ENCOUNTER — Ambulatory Visit (INDEPENDENT_AMBULATORY_CARE_PROVIDER_SITE_OTHER): Payer: Medicare Other | Admitting: Internal Medicine

## 2019-12-08 ENCOUNTER — Encounter: Payer: Self-pay | Admitting: Internal Medicine

## 2019-12-08 VITALS — BP 153/107 | HR 107 | Temp 99.0°F | Ht 71.0 in | Wt >= 6400 oz

## 2019-12-08 DIAGNOSIS — M25562 Pain in left knee: Secondary | ICD-10-CM | POA: Diagnosis not present

## 2019-12-08 DIAGNOSIS — I10 Essential (primary) hypertension: Secondary | ICD-10-CM

## 2019-12-08 DIAGNOSIS — M25561 Pain in right knee: Secondary | ICD-10-CM

## 2019-12-08 DIAGNOSIS — I5023 Acute on chronic systolic (congestive) heart failure: Secondary | ICD-10-CM

## 2019-12-08 LAB — BASIC METABOLIC PANEL
Anion gap: 13 (ref 5–15)
BUN: 9 mg/dL (ref 6–20)
CO2: 25 mmol/L (ref 22–32)
Calcium: 9.6 mg/dL (ref 8.9–10.3)
Chloride: 102 mmol/L (ref 98–111)
Creatinine, Ser: 1.12 mg/dL — ABNORMAL HIGH (ref 0.44–1.00)
GFR, Estimated: >60 mL/min (ref >60)
Glucose, Bld: 97 mg/dL (ref 70–99)
Potassium: 3.8 mmol/L (ref 3.5–5.1)
Sodium: 140 mmol/L (ref 135–145)

## 2019-12-08 LAB — BRAIN NATRIURETIC PEPTIDE: B Natriuretic Peptide: 27.9 pg/mL (ref 0.0–100.0)

## 2019-12-08 MED ORDER — DICLOFENAC SODIUM 1 % EX GEL: 2.0000 g | Freq: Four times a day (QID) | CUTANEOUS | 1 refills | Status: DC

## 2019-12-08 MED ORDER — TORSEMIDE 20 MG PO TABS: 40.0000 mg | ORAL_TABLET | Freq: Two times a day (BID) | ORAL | 0 refills | Status: DC

## 2019-12-08 NOTE — Patient Instructions (Signed)
Thank you for allowing [name] to provide your care today.  You have some extra fluid in your body due to heart failure. As we discussed, please:  1- Stop Lasix 2- Start Torsemide 40 mg (2x20 mg tablets) twice a day 3-Take Amlodipine, Valsartan and HCTZ as instructed  4-Come back to clinic in 5 days  We also discussed your knee pain: 5-Apply Voltaren gel on your knees as needed for pain up to 4 times a day 6-We refer you to orthopedic surgeon-knee specialist for further evaluation. Some one form their office will call you for the appointment  7-ake rest of your medications as before  I have ordered labs for you. I will call if any are abnormal.    Please follow-up in 5 days.   Should you have any questions or concerns please call the internal medicine clinic at [phone].

## 2019-12-08 NOTE — Progress Notes (Signed)
Established Patient Office Visit  Subjective:  Patient ID: Lindsay [name], female    DOB: [birth year]  Age: 49 y.o. MRN: [id number]  CC:  Chief Complaint  Patient presents with  . Bloated    Swelling   . Edema    HPI Lindsay [name]  49 y/o female with PMHx as documented below, presented with SOB and swelling of abdomen. Please refer to problem based charting for further details and assessment and plan of current problem and chronic medical conditions.  Medical history: HTN, hemorrhoids, migraine, heart failure, chronic hypoxia respiratory failure, OSA, IBS, hidradenitis, MDD, GAD, morbid obesity, fibromyalgia, but vitamin D deficiency  Past Medical History:  Diagnosis Date  . Acid reflux   . Anemia    Iron Def  . Anorexia   . CHF (congestive heart failure) ([place])   . Chronic kidney disease    Nephrotic syndrome  . Colon polyp 2009  . Depression with anxiety   . Edema leg   . Fibromyalgia   . Hemorrhoids   . Hidradenitis suppurativa   . Hypertension   . IBS (irritable bowel syndrome)   . Low back pain   . Migraines   . Morbidly obese ([place])   . Neuromuscular disorder (H[facility])    fibromyalgia  . Neuropathy   . Panic attacks   . Polyp of vocal cord or larynx   . Tonsil pain     Past Surgical History:  Procedure Laterality Date  . AXILLARY HIDRADENITIS EXCISION    . COLONOSCOPY WITH PROPOFOL N/A 05/25/2015   Procedure: COLONOSCOPY WITH PROPOFOL;  Surgeon: Milus Banister, MD;  Location: [facility] END[facility]COPY;  Service: Endoscopy;  Laterality: N/A;  . COLONOSCOPY WITH PROPOFOL N/A 12/31/2018   Procedure: COLONOSCOPY WITH PROPOFOL;  Surgeon: Milus Banister, MD;  Location: [facility]COPY;  Service: Endoscopy;  Laterality: N/A;  . HEMORRHOID SURGERY     with Hidradenitis surgery   . INGUINAL HIDRADENITIS EXCISION    . TONSILLECTOMY  10/18/2010   by Dr. Wilburn Cornelia  . UPPER GASTROINTESTINAL ENDOSCOPY      Family History  Problem Relation Age of Onset  . Diabetes Mother   . Heart  disease Mother        valve leak  . Anxiety disorder Mother   . Depression Mother   . High blood pressure Mother   . Kidney failure Mother   . Cancer Father        prostate  . Heart disease Father   . Learning disabilities Sister   . Depression Sister   . Depression Sister   . Anxiety disorder Sister   . Colon cancer Neg Hx     Social History   Socioeconomic History  . Marital status: Single    Spouse name: Not on file  . Number of children: 1  . Years of education: 47  . Highest education level: Not on file  Occupational History  . Occupation: Disabled  Tobacco Use  . Smoking status: Former Smoker    Packs/day: 0.10    Years: 25.00    Pack years: 2.50    Types: Cigarettes    Quit date: 05/24/2019    Years since quitting: 0.5  . Smokeless tobacco: Never Used  . Tobacco comment: quit in April.  Vaping Use  . Vaping Use: Never used  Substance and Sexual Activity  . Alcohol use: Not Currently    Alcohol/week: 0.0 standard drinks    Comment: none sine 05/2018  . Drug use: Not Currently  Frequency: 3.0 times per week    Types: Marijuana  . Sexual activity: Not Currently    Birth control/protection: Implant  Other Topics Concern  . Not on file  Social History Narrative   Lives at home w/ her mother and daughter   Right-handed   Caffeine: about 3 Cokes per week   Social Determinants of Health   Financial Resource Strain:   . Difficulty of Paying Living Expenses: Not on file  Food Insecurity:   . Worried About Charity fundraiser in the Last Year: Not on file  . Ran Out of Food in the Last Year: Not on file  Transportation Needs:   . Lack of Transportation (Medical): Not on file  . Lack of Transportation (Non-Medical): Not on file  Physical Activity:   . Days of Exercise per Week: Not on file  . Minutes of Exercise per Session: Not on file  Stress:   . Feeling of Stress : Not on file  Social Connections:   . Frequency of Communication with Friends and  Family: Not on file  . Frequency of Social Gatherings with Friends and Family: Not on file  . Attends Religious Services: Not on file  . Active Member of Clubs or Organizations: Not on file  . Attends Archivist Meetings: Not on file  . Marital Status: Not on file  Intimate Partner Violence:   . Fear of Current or Ex-Partner: Not on file  . Emotionally Abused: Not on file  . Physically Abused: Not on file  . Sexually Abused: Not on file    Outpatient Medications Prior to Visit  Medication Sig Dispense Refill  . acetaminophen (TYLENOL) 650 MG CR tablet Take 1,300 mg by mouth every 8 (eight) hours as needed for pain.    [name] albuterol (PROVENTIL) (2.5 MG/3ML) 0.083% nebulizer solution TAKE 3 MLS (2.5 MG TOTAL) BY NEBULIZATION EVERY 4 (FOUR) HOURS. 75 mL 12  . albuterol (VENTOLIN HFA) 108 (90 Base) MCG/ACT inhaler TAKE 2 PUFFS BY MOUTH EVERY 6 HOURS AS NEEDED FOR WHEEZE OR SHORTNESS OF BREATH (Patient taking differently: Inhale 2 puffs into the lungs every 6 (six) hours as needed for wheezing or shortness of breath. ) 8.5 each 5  . amLODipine (NORVASC) 10 MG tablet TAKE 1/2 TABLET BY MOUTH EVERY DAY (Patient taking differently: Take 5 mg by mouth daily. ) 45 tablet 1  . Ascorbic Acid (VITAMIN C PO) Take 1 tablet by mouth daily.    [name] BIOTIN PO Take 1 tablet by mouth daily.    . Brexpiprazole (REXULTI) 3 MG TABS Take 1 tablet (3 mg total) by mouth daily. 30 tablet 2  . cholecalciferol (VITAMIN D) 25 MCG (1000 UNIT) tablet Take 1 tablet (1,000 Units total) by mouth daily. 90 tablet 1  . clorazepate (TRANXENE) 3.75 MG tablet Take 1 tablet (3.75 mg total) by mouth daily. 30 tablet 2  . hydrOXYzine (VISTARIL) 50 MG capsule Take 1 capsule (50 mg total) by mouth at bedtime as needed for anxiety. (Patient taking differently: Take 50 mg by mouth at bedtime. ) 33 capsule 2  . lamoTRIgine (LAMICTAL) 150 MG tablet Take one tab daily (Patient taking differently: Take 75 mg by mouth 2 (two) times  daily. ) 90 tablet 0  . lidocaine (XYLOCAINE) 5 % ointment Apply 1 application topically 2 (two) times daily as needed. (Patient taking differently: Apply 1 application topically 2 (two) times daily as needed for mild pain. ) 35.44 g 0  . omeprazole (PRILOSEC) 40  MG capsule TAKE 1 CAPSULE BY MOUTH EVERY DAY (Patient taking differently: Take 40 mg by mouth daily. ) 90 capsule 3  . OXYGEN Inhale 5 L into the lungs.     . promethazine (PHENERGAN) 12.5 MG tablet TAKE 1 TABLET BY MOUTH EVERY 6 HOURS AS NEEDED FOR NAUSEA OR VOMITING. 90 tablet 1  . SYMBICORT 160-4.5 MCG/ACT inhaler Inhale 2 puffs into the lungs in the morning and at bedtime. 1 each 3  . valsartan (DIOVAN) 80 MG tablet Take 2 tablets (160 mg total) by mouth daily. (Patient taking differently: Take 240 mg by mouth daily. ) 180 tablet 3  . vitamin B-12 (CYANOCOBALAMIN) 1000 MCG tablet Take 1 tablet (1,000 mcg total) by mouth daily. 30 tablet 3  . Vitamin E 400 units TABS Take 400 Units by mouth daily.     . famotidine (PEPCID) 20 MG tablet Take 1 tablet (20 mg total) by mouth daily. 30 tablet 2  . furosemide (LASIX) 40 MG tablet Take 1 tablet (40 mg total) by mouth daily. 30 tablet 0   No facility-administered medications prior to visit.    Allergies  Allergen Reactions  . Lisinopril Rash and Cough    ROS Review of Systems Negative except mentioned in HPI and assessment and plan.   Objective:    BP (!) 153/107 (BP Location: Left Arm, Patient Position: Sitting, Cuff Size: Large)   Pulse (!) 107   Temp 99 F (37.2 C) (Oral)   Ht 5\' 11"  (1.803 m)   Wt (!) 406 lb 14.4 oz (184.6 kg)   LMP 12/07/2019   SpO2 100% Comment: 4 liters  BMI 56.75 kg/m  Wt Readings from Last 3 Encounters:  12/08/19 (!) 406 lb 14.4 oz (184.6 kg)  11/29/19 (!) 403 lb 12.8 oz (183.2 kg)  11/25/19 (!) 406 lb 8 oz (184.4 kg)   Physical Exam Constitutional:      Appearance: She is obese. She is not ill-appearing.  Cardiovascular:     Rate and  Rhythm: Tachycardia present.     Pulses: Normal pulses.     Heart sounds: Normal heart sounds. No murmur heard.   Pulmonary:     Effort: Pulmonary effort is normal.     Breath sounds: Normal breath sounds. No wheezing or rales.  Abdominal:     General: There is distension.     Palpations: Abdomen is soft.     Tenderness: There is no abdominal tenderness.  Musculoskeletal:        General: Tenderness present.     Right lower leg: Edema present.     Left lower leg: Edema present.     Comments: Focal tenderness at medial side of rt knee  Neurological:     Mental Status: She is oriented to person, place, and time. Mental status is at baseline.  Psychiatric:        Mood and Affect: Mood normal.        Behavior: Behavior normal.        Thought Content: Thought content normal.        Judgment: Judgment normal.    Health Maintenance Due  Topic Date Due  . Hepatitis C Screening  Never done    There are no preventive care reminders to display for this patient.  Lab Results  Component Value Date   TSH 1.140 01/08/2016   Lab Results  Component Value Date   WBC 8.2 11/29/2019   HGB 11.6 (L) 11/29/2019   HCT 37.7 11/29/2019   MCV  86.1 11/29/2019   PLT 398 11/29/2019   Lab Results  Component Value Date   NA 140 12/08/2019   K 3.8 12/08/2019   CO2 25 12/08/2019   GLUCOSE 97 12/08/2019   BUN 9 12/08/2019   CREATININE 1.12 (H) 12/08/2019   BILITOT 0.7 11/26/2019   ALKPHOS 49 11/26/2019   AST 12 (L) 11/26/2019   ALT 15 11/26/2019   PROT 7.2 11/26/2019   ALBUMIN 3.8 11/26/2019   CALCIUM 9.6 12/08/2019   ANIONGAP 13 12/08/2019   Lab Results  Component Value Date   CHOL 139 05/06/2017   Lab Results  Component Value Date   HDL 69 05/06/2017   Lab Results  Component Value Date   LDLCALC 61 05/06/2017   Lab Results  Component Value Date   TRIG 47 05/06/2017   Lab Results  Component Value Date   CHOLHDL 2.0 05/06/2017   Lab Results  Component Value Date    HGBA1C 5.5 05/06/2017      Assessment & Plan:   Problem List Items Addressed This Visit      Cardiovascular and Mediastinum   Essential hypertension (Chronic)    The patient's blood pressure during this visit was 153/107. The patient states that she has stopped her amlodipine and HCTZ since she was discharged from hospital because she thought she was supposed to do so. I asked her to resume these mediacation. Unfortunately she also took wrong dose of Valsartan (took 3 x 80 mg tablets, but supposed to take 2x80 (160mg ) daily. Reviewed the correct medications doses with her.  BP Readings from Last 3 Encounters:  12/08/19 (!) 153/107  11/29/19 127/86  11/25/19 122/83   -BMP today -Resume HCTZ and Amlodipine -Valsartan 160 mg QD       Relevant Medications   torsemide (DEMADEX) 20 MG tablet   Acute on chronic systolic heart failure ([place]) - Primary    Patient was hospitalized 10/28-11/1 for acute respiratory failure due to acute on chronic heart failure exacerbation.  She was discharged on 3 L oxygen via nasal cannula.   She came in w SOB, and abdominal swelling. She reports adherence to Lasix but her weight is up again: Weight at discharge was 183.2 kg.  Weight today is 184.6 (3 lb higher than wt at discharge and similar to her weight at time of admission.). She is saturating 100% at 4 li [place] now.  -Previously seen by [facility] but became lost to follow up due to multiple no-shows. Last echo in 03/2019 with global hypokinesis with EF 45-50%. No prior ischemic work-up.  Has LEE. No crackles on exam: Likly Rt side HF. She will need more diuresis. This can be done out patient for now. Will switch lasix to Torsemide for better absorption, given her abdominal distension and probable gut edema.   Acute exacerbation of HFrEF:  -Stop lasix -Torsemide 40 mg BID and follow up in clinic in 5 days to reassess -No need for cards evaluation urgently for new HF but recommending non urgent  f/u. -May consider BB when not in exacerbation -BMP, BNP -f/u in clinic in 5 days for reassessment and BMP, or sooner as needed        Relevant Medications   torsemide (DEMADEX) 20 MG tablet   Other Relevant Orders   Brain natriuretic peptide (Completed)   BMP w Anion Gap (STAT/Sunquest-performed on-site) (Completed)     Other   Pain in both knees    Patient report bilateral R>L knee pain since she fell about  3 weeks ago. On exam, she has tenderness at medical side of rt knee. ROM is very painful R knee>L knee. Not able to perform complete knee exam given pain.  X ray of both knee obtained in Ed after the fall and did not show acute Fx or abnormality. POC [name] today is w/o effusion.  Patient is concerned about tendon or lig injury but unfortunately hard to assess by exam given painful ROM and obesity. Will prescribe Voltaren gel and refer to ortho for better assessment and appreciate recommendation  -Ambulatory referral to Ortho  -Voltaren gel PRN      Relevant Medications   diclofenac Sodium (VOLTAREN) 1 % GEL   Other Relevant Orders   Ambulatory referral to Orthopedic Surgery      Filed Weights   12/08/19 1312  Weight: (!) 406 lb 14.4 oz (184.6 kg)     Meds ordered this encounter  Medications  . diclofenac Sodium (VOLTAREN) 1 % GEL    Sig: Apply 2 g topically 4 (four) times daily. PRN for knee pain    Dispense:  4 g    Refill:  1  . torsemide (DEMADEX) 20 MG tablet    Sig: Take 2 tablets (40 mg total) by mouth in the morning and at bedtime.    Dispense:  60 tablet    Refill:  0    Follow-up: No follow-ups on file.    Dewayne Hatch, MD

## 2019-12-08 NOTE — Assessment & Plan Note (Signed)
The patient's blood pressure during this visit was 153/107. The patient states that she has stopped her amlodipine and HCTZ since she was discharged from hospital because she thought she was supposed to do so. I asked her to resume these mediacation. Unfortunately she also took wrong dose of Valsartan (took 3 x 80 mg tablets, but supposed to take 2x80 (160mg ) daily. Reviewed the correct medications doses with her.  BP Readings from Last 3 Encounters:  12/08/19 (!) 153/107  11/29/19 127/86  11/25/19 122/83   -BMP today -Resume HCTZ and Amlodipine -Valsartan 160 mg QD

## 2019-12-09 ENCOUNTER — Encounter: Payer: Medicare Other | Admitting: Internal Medicine

## 2019-12-09 DIAGNOSIS — M25561 Pain in right knee: Secondary | ICD-10-CM | POA: Insufficient documentation

## 2019-12-09 DIAGNOSIS — M25562 Pain in left knee: Secondary | ICD-10-CM | POA: Insufficient documentation

## 2019-12-09 NOTE — Assessment & Plan Note (Signed)
Patient report bilateral R>L knee pain since she fell about 3 weeks ago. On exam, she has tenderness at medical side of rt knee. ROM is very painful R knee>L knee. Not able to perform complete knee exam given pain.  X ray of both knee obtained in Ed after the fall and did not show acute Fx or abnormality. POC [name] today is w/o effusion.  Patient is concerned about tendon or lig injury but unfortunately hard to assess by exam given painful ROM and obesity. Will prescribe Voltaren gel and refer to ortho for better assessment and appreciate recommendation  -Ambulatory referral to Ortho  -Voltaren gel PRN

## 2019-12-09 NOTE — Progress Notes (Signed)
Internal Medicine Clinic Attending  Case discussed with Dr. Myrtie Hawk  At the time of the visit.  We reviewed the resident's history and exam and pertinent patient test results.  I agree with the assessment, diagnosis, and plan of care documented in the resident's note. Pt is putting back on diuresed weight, suspect lasix 40mg  is too low of a dose, increase to toresemide 40mg  BID, close follow up with repeat BMP, if hypokalemia will need to d/c HCTZ.

## 2019-12-09 NOTE — Assessment & Plan Note (Addendum)
Patient was hospitalized 10/28-11/1 for acute respiratory failure due to acute on chronic heart failure exacerbation.  She was discharged on 3 L oxygen via nasal cannula.   She came in w SOB, and abdominal swelling. She reports adherence to Lasix but her weight is up again: Weight at discharge was 183.2 kg.  Weight today is 184.6 (3 lb higher than wt at discharge and similar to her weight at time of admission.). She is saturating 100% at 4 li [place] now.  -Previously seen by [facility] but became lost to follow up due to multiple no-shows. Last echo in 03/2019 with global hypokinesis with EF 45-50%. No prior ischemic work-up.  Has LEE. No crackles on exam: Likly Rt side HF. She will need more diuresis. This can be done out patient for now. Will switch lasix to Torsemide for better absorption, given her abdominal distension and probable gut edema.   Acute exacerbation of HFrEF:  -Stop lasix -Torsemide 40 mg BID and follow up in clinic in 5 days to reassess -No need for cards evaluation urgently for new HF but recommending non urgent f/u. -May consider BB when not in exacerbation -BMP, BNP -f/u in clinic in 5 days for reassessment and BMP, or sooner as needed

## 2019-12-10 ENCOUNTER — Other Ambulatory Visit: Payer: Self-pay | Admitting: Internal Medicine

## 2019-12-10 DIAGNOSIS — L732 Hidradenitis suppurativa: Secondary | ICD-10-CM

## 2019-12-10 DIAGNOSIS — J449 Chronic obstructive pulmonary disease, unspecified: Secondary | ICD-10-CM | POA: Diagnosis not present

## 2019-12-10 DIAGNOSIS — N189 Chronic kidney disease, unspecified: Secondary | ICD-10-CM | POA: Diagnosis not present

## 2019-12-10 DIAGNOSIS — D509 Iron deficiency anemia, unspecified: Secondary | ICD-10-CM | POA: Diagnosis not present

## 2019-12-10 DIAGNOSIS — K219 Gastro-esophageal reflux disease without esophagitis: Secondary | ICD-10-CM | POA: Diagnosis not present

## 2019-12-10 DIAGNOSIS — G4733 Obstructive sleep apnea (adult) (pediatric): Secondary | ICD-10-CM | POA: Diagnosis not present

## 2019-12-10 DIAGNOSIS — I5023 Acute on chronic systolic (congestive) heart failure: Secondary | ICD-10-CM | POA: Diagnosis not present

## 2019-12-10 DIAGNOSIS — I130 Hypertensive heart and chronic kidney disease with heart failure and stage 1 through stage 4 chronic kidney disease, or unspecified chronic kidney disease: Secondary | ICD-10-CM | POA: Diagnosis not present

## 2019-12-10 DIAGNOSIS — N179 Acute kidney failure, unspecified: Secondary | ICD-10-CM | POA: Diagnosis not present

## 2019-12-10 DIAGNOSIS — M545 Low back pain, unspecified: Secondary | ICD-10-CM | POA: Diagnosis not present

## 2019-12-10 DIAGNOSIS — F32A Depression, unspecified: Secondary | ICD-10-CM | POA: Diagnosis not present

## 2019-12-10 DIAGNOSIS — D631 Anemia in chronic kidney disease: Secondary | ICD-10-CM | POA: Diagnosis not present

## 2019-12-10 DIAGNOSIS — J9611 Chronic respiratory failure with hypoxia: Secondary | ICD-10-CM | POA: Diagnosis not present

## 2019-12-13 ENCOUNTER — Encounter (HOSPITAL_COMMUNITY): Payer: Self-pay | Admitting: Psychiatry

## 2019-12-13 ENCOUNTER — Telehealth (INDEPENDENT_AMBULATORY_CARE_PROVIDER_SITE_OTHER): Payer: Medicare Other | Admitting: Psychiatry

## 2019-12-13 ENCOUNTER — Other Ambulatory Visit: Payer: Self-pay

## 2019-12-13 DIAGNOSIS — F4312 Post-traumatic stress disorder, chronic: Secondary | ICD-10-CM | POA: Diagnosis not present

## 2019-12-13 DIAGNOSIS — F410 Panic disorder [episodic paroxysmal anxiety] without agoraphobia: Secondary | ICD-10-CM

## 2019-12-13 DIAGNOSIS — F331 Major depressive disorder, recurrent, moderate: Secondary | ICD-10-CM

## 2019-12-13 MED ORDER — REXULTI 3 MG PO TABS: 3.0000 mg | ORAL_TABLET | Freq: Every day | ORAL | 2 refills | Status: DC

## 2019-12-13 MED ORDER — CLORAZEPATE DIPOTASSIUM 3.75 MG PO TABS: 3.7500 mg | ORAL_TABLET | Freq: Every day | ORAL | 2 refills | Status: DC

## 2019-12-13 MED ORDER — LAMOTRIGINE 150 MG PO TABS: ORAL_TABLET | ORAL | 0 refills | Status: DC

## 2019-12-13 MED ORDER — HYDROXYZINE PAMOATE 50 MG PO CAPS: 50.0000 mg | ORAL_CAPSULE | Freq: Every day | ORAL | 2 refills | Status: DC
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
## 2019-12-15 DIAGNOSIS — I5023 Acute on chronic systolic (congestive) heart failure: Secondary | ICD-10-CM | POA: Diagnosis not present

## 2019-12-15 DIAGNOSIS — D509 Iron deficiency anemia, unspecified: Secondary | ICD-10-CM | POA: Diagnosis not present

## 2019-12-15 DIAGNOSIS — J449 Chronic obstructive pulmonary disease, unspecified: Secondary | ICD-10-CM | POA: Diagnosis not present

## 2019-12-15 DIAGNOSIS — M545 Low back pain, unspecified: Secondary | ICD-10-CM | POA: Diagnosis not present

## 2019-12-15 DIAGNOSIS — J9611 Chronic respiratory failure with hypoxia: Secondary | ICD-10-CM | POA: Diagnosis not present

## 2019-12-15 DIAGNOSIS — G4733 Obstructive sleep apnea (adult) (pediatric): Secondary | ICD-10-CM | POA: Diagnosis not present

## 2019-12-15 DIAGNOSIS — I130 Hypertensive heart and chronic kidney disease with heart failure and stage 1 through stage 4 chronic kidney disease, or unspecified chronic kidney disease: Secondary | ICD-10-CM | POA: Diagnosis not present

## 2019-12-15 DIAGNOSIS — D631 Anemia in chronic kidney disease: Secondary | ICD-10-CM | POA: Diagnosis not present

## 2019-12-15 DIAGNOSIS — N179 Acute kidney failure, unspecified: Secondary | ICD-10-CM | POA: Diagnosis not present

## 2019-12-15 DIAGNOSIS — N189 Chronic kidney disease, unspecified: Secondary | ICD-10-CM | POA: Diagnosis not present

## 2019-12-15 DIAGNOSIS — K219 Gastro-esophageal reflux disease without esophagitis: Secondary | ICD-10-CM | POA: Diagnosis not present

## 2019-12-15 DIAGNOSIS — F32A Depression, unspecified: Secondary | ICD-10-CM | POA: Diagnosis not present

## 2019-12-16 ENCOUNTER — Ambulatory Visit (INDEPENDENT_AMBULATORY_CARE_PROVIDER_SITE_OTHER): Payer: Medicare Other | Admitting: Internal Medicine

## 2019-12-16 ENCOUNTER — Other Ambulatory Visit: Payer: Self-pay

## 2019-12-16 ENCOUNTER — Telehealth (HOSPITAL_COMMUNITY): Payer: Medicare Other | Admitting: Psychiatry

## 2019-12-16 ENCOUNTER — Encounter: Payer: Self-pay | Admitting: Internal Medicine

## 2019-12-16 VITALS — BP 120/89 | HR 117 | Temp 98.9°F | Ht 71.0 in | Wt >= 6400 oz

## 2019-12-16 DIAGNOSIS — I10 Essential (primary) hypertension: Secondary | ICD-10-CM | POA: Diagnosis not present

## 2019-12-16 DIAGNOSIS — M25561 Pain in right knee: Secondary | ICD-10-CM

## 2019-12-16 DIAGNOSIS — I5023 Acute on chronic systolic (congestive) heart failure: Secondary | ICD-10-CM | POA: Diagnosis not present

## 2019-12-16 DIAGNOSIS — M25562 Pain in left knee: Secondary | ICD-10-CM

## 2019-12-16 DIAGNOSIS — M79604 Pain in right leg: Secondary | ICD-10-CM

## 2019-12-16 DIAGNOSIS — M79605 Pain in left leg: Secondary | ICD-10-CM

## 2019-12-16 LAB — BASIC METABOLIC PANEL
Anion gap: 12 (ref 5–15)
BUN: 34 mg/dL — ABNORMAL HIGH (ref 6–20)
CO2: 32 mmol/L (ref 22–32)
Calcium: 9.4 mg/dL (ref 8.9–10.3)
Chloride: 97 mmol/L — ABNORMAL LOW (ref 98–111)
Creatinine, Ser: 1.99 mg/dL — ABNORMAL HIGH (ref 0.44–1.00)
GFR, Estimated: 30 mL/min — ABNORMAL LOW (ref >60)
Glucose, Bld: 155 mg/dL — ABNORMAL HIGH (ref 70–99)
Potassium: 3 mmol/L — ABNORMAL LOW (ref 3.5–5.1)
Sodium: 141 mmol/L (ref 135–145)

## 2019-12-16 MED ORDER — CARVEDILOL 3.125 MG PO TABS: 3.1250 mg | ORAL_TABLET | Freq: Two times a day (BID) | ORAL | 0 refills | Status: DC

## 2019-12-16 MED ORDER — DICLOFENAC SODIUM 1 % EX GEL: 2.0000 g | Freq: Four times a day (QID) | CUTANEOUS | 1 refills | Status: DC

## 2019-12-16 MED ORDER — POTASSIUM CHLORIDE ER 20 MEQ PO TBCR: 40.0000 meq | EXTENDED_RELEASE_TABLET | Freq: Every day | ORAL | 0 refills | Status: DC

## 2019-12-16 MED ORDER — TORSEMIDE 20 MG PO TABS: 40.0000 mg | ORAL_TABLET | Freq: Every day | ORAL | 0 refills | Status: DC

## 2019-12-16 NOTE — Patient Instructions (Addendum)
Thank you for allowing [name] to provide your care today. Today you came in for follow up. You have lost 3 lb since last visit which is great and shows that the fluid pill has worked well. Please continue taking your medications as instructed.   Today we made no changes to your medications. We will do blood work today and will call you if need to change your medications. Your leg pain can be due to fibromyalgia or referral pain from your knees. Please follow up with orthopedic office for knee pain and I also check blood work to see if there is other reason for your pain.  Please follow-up in clinic in 2 weeks for follow up of your heart failure and to repeat blood work. Please bring your medications with you next time.  Should you have any questions or concerns please call the internal medicine clinic at [phone].

## 2019-12-16 NOTE — Assessment & Plan Note (Addendum)
BP today nl at 120/89. On Amlodipine and HCTZ and Valzartan.  -BMP stat>showed hypokalemia -DC HCTZ -Continue Valzartan -Starting coreg 3.125 mg BID

## 2019-12-16 NOTE — Assessment & Plan Note (Addendum)
Heart failure: This is the second follow-up visit for acute on chronic heart failure exacerbation.   10/28: (Patient was hospitalized 10/28-11/1 for acute respiratory failure due to acute on chronic heart failure exacerbation.  She is on 3-4 L of oxygen at baseline.  Weight on discharge 183.2 kg. Last echo on February 2021 showed EF 45-50%, with global hypokinesis.  No prior ischemic work-up.  Patient was previously seen by Dr. Stanford Breed but lost to follow-up due to multiple no-shows.   12/09/2019: Post hospital follow-up: Patient recalculated fluid in her lower extremity and abdomen, and her weight was up again. Patient's diuretic switched from Lasix to torsemide given volume overload, abdominal swelling.  Today: She reports adherence to torsemide. She lost 3lb after we switched her to increased dose of Torsemide (Weight today: 184.2 which is 1.6 kg down from last visit and after up titrating diuretic dose.)  Volume status today:  Abdominal distension and LEE decreased.  -BMP stat ADDENDUM: BMP shows hypokalemia and bump in Cr and BUN. She is at her dry weight and probably mildly over diuresed now. 1-Holding Torsemide this PM and tomorrow and will continue with decreased dose of 40 mg Torsemide QD starting 12/18/2019 2-Sending prescription for Kcl 40 meq QD x 3 days 3-Will stop HCTZ. 4-Starting low dose BB: Coreg 3.125 mg BID (HR 117 today) 5-Continue valsartan 160 mg daily 6- F/u in clinic in 1 week I called patient and informed her about the plan. She agrees with the changes. BMP Latest Ref Rng & Units 12/16/2019 12/08/2019 11/29/2019  Glucose 70 - 99 mg/dL 155(H) 97 117(H)  BUN 6 - 20 mg/dL 34(H) 9 20  Creatinine 0.44 - 1.00 mg/dL 1.99(H) 1.12(H) 1.28(H)  BUN/Creat Ratio 9 - 23 - - -  Sodium 135 - 145 mmol/L 141 140 137  Potassium 3.5 - 5.1 mmol/L 3.0(L) 3.8 4.7  Chloride 98 - 111 mmol/L 97(L) 102 98  CO2 22 - 32 mmol/L 32 25 30  Calcium 8.9 - 10.3 mg/dL 9.4 9.6 9.1

## 2019-12-16 NOTE — Progress Notes (Signed)
Established Patient Office Visit  Subjective:  Patient ID: Lindsay [name], female    DOB: July 10, 1970  Age: 49 y.o. MRN: [id number]  CC: Follow-up of heart failure Chief Complaint  Patient presents with  . Follow-up    pt states that she is her for her possible change of medication and pain     HPI Lindsay [name]  50 y/o female with PMHx as documented below, presented for follow-up of acute on chronic heart failure. Please refer to problem based charting for further details and assessment and plan of current problem and chronic medical conditions.  Medical history: HTN, hemorrhoids, migraine, heart failure, chronic hypoxia respiratory failure, OSA, IBS, hidradenitis, MDD, GAD, morbid obesity, fibromyalgia, but vitamin D deficiency  Past Medical History:  Diagnosis Date  . Acid reflux   . Anemia    Iron Def  . Anorexia   . CHF (congestive heart failure) ([place])   . Chronic kidney disease    Nephrotic syndrome  . Colon polyp 2009  . Depression with anxiety   . Edema leg   . Fibromyalgia   . Hemorrhoids   . Hidradenitis suppurativa   . Hypertension   . IBS (irritable bowel syndrome)   . Low back pain   . Migraines   . Morbidly obese ([place])   . Neuromuscular disorder (H[facility])    fibromyalgia  . Neuropathy   . Panic attacks   . Polyp of vocal cord or larynx   . Tonsil pain     Past Surgical History:  Procedure Laterality Date  . AXILLARY HIDRADENITIS EXCISION    . COLONOSCOPY WITH PROPOFOL N/A 05/25/2015   Procedure: COLONOSCOPY WITH PROPOFOL;  Surgeon: Milus Banister, MD;  Location: [facility];  Service: Endoscopy;  Laterality: N/A;  . COLONOSCOPY WITH PROPOFOL N/A 12/31/2018   Procedure: COLONOSCOPY WITH PROPOFOL;  Surgeon: Milus Banister, MD;  Location: [facility];  Service: Endoscopy;  Laterality: N/A;  . HEMORRHOID SURGERY     with Hidradenitis surgery   . INGUINAL HIDRADENITIS EXCISION    . TONSILLECTOMY  10/18/2010   by Dr. Wilburn Cornelia  . UPPER GASTROINTESTINAL  ENDOSCOPY      Family History  Problem Relation Age of Onset  . Diabetes Mother   . Heart disease Mother        valve leak  . Anxiety disorder Mother   . Depression Mother   . High blood pressure Mother   . Kidney failure Mother   . Cancer Father        prostate  . Heart disease Father   . Learning disabilities Sister   . Depression Sister   . Depression Sister   . Anxiety disorder Sister   . Colon cancer Neg Hx     Social History   Socioeconomic History  . Marital status: Single    Spouse name: Not on file  . Number of children: 1  . Years of education: 18  . Highest education level: Not on file  Occupational History  . Occupation: Disabled  Tobacco Use  . Smoking status: Former Smoker    Packs/day: 0.10    Years: 25.00    Pack years: 2.50    Types: Cigarettes    Quit date: 05/24/2019    Years since quitting: 0.5  . Smokeless tobacco: Never Used  . Tobacco comment: quit in April.  Vaping Use  . Vaping Use: Never used  Substance and Sexual Activity  . Alcohol use: Not Currently    Alcohol/week: 0.0 standard drinks  Comment: none sine 05/2018  . Drug use: Not Currently    Frequency: 3.0 times per week    Types: Marijuana  . Sexual activity: Not Currently    Birth control/protection: Implant  Other Topics Concern  . Not on file  Social History Narrative   Lives at home w/ her mother and daughter   Right-handed   Caffeine: about 3 Cokes per week   Social Determinants of Health   Financial Resource Strain:   . Difficulty of Paying Living Expenses: Not on file  Food Insecurity:   . Worried About Charity fundraiser in the Last Year: Not on file  . Ran Out of Food in the Last Year: Not on file  Transportation Needs:   . Lack of Transportation (Medical): Not on file  . Lack of Transportation (Non-Medical): Not on file  Physical Activity:   . Days of Exercise per Week: Not on file  . Minutes of Exercise per Session: Not on file  Stress:   . Feeling  of Stress : Not on file  Social Connections:   . Frequency of Communication with Friends and Family: Not on file  . Frequency of Social Gatherings with Friends and Family: Not on file  . Attends Religious Services: Not on file  . Active Member of Clubs or Organizations: Not on file  . Attends Archivist Meetings: Not on file  . Marital Status: Not on file  Intimate Partner Violence:   . Fear of Current or Ex-Partner: Not on file  . Emotionally Abused: Not on file  . Physically Abused: Not on file  . Sexually Abused: Not on file    Outpatient Medications Prior to Visit  Medication Sig Dispense Refill  . acetaminophen (TYLENOL) 650 MG CR tablet Take 1,300 mg by mouth every 8 (eight) hours as needed for pain.    [name] albuterol (PROVENTIL) (2.5 MG/3ML) 0.083% nebulizer solution TAKE 3 MLS (2.5 MG TOTAL) BY NEBULIZATION EVERY 4 (FOUR) HOURS. 75 mL 12  . albuterol (VENTOLIN HFA) 108 (90 Base) MCG/ACT inhaler TAKE 2 PUFFS BY MOUTH EVERY 6 HOURS AS NEEDED FOR WHEEZE OR SHORTNESS OF BREATH (Patient taking differently: Inhale 2 puffs into the lungs every 6 (six) hours as needed for wheezing or shortness of breath. ) 8.5 each 5  . amLODipine (NORVASC) 10 MG tablet TAKE 1/2 TABLET BY MOUTH EVERY DAY (Patient taking differently: Take 5 mg by mouth daily. ) 45 tablet 1  . Ascorbic Acid (VITAMIN C PO) Take 1 tablet by mouth daily.    [name] BIOTIN PO Take 1 tablet by mouth daily.    . Brexpiprazole (REXULTI) 3 MG TABS Take 1 tablet (3 mg total) by mouth daily. 30 tablet 2  . cholecalciferol (VITAMIN D) 25 MCG (1000 UNIT) tablet Take 1 tablet (1,000 Units total) by mouth daily. 90 tablet 1  . clindamycin (CLINDAGEL) 1 % gel APPLY TO AFFECTED AREA TWICE A DAY 30 g 0  . clorazepate (TRANXENE) 3.75 MG tablet Take 1 tablet (3.75 mg total) by mouth daily. 30 tablet 2  . hydrOXYzine (VISTARIL) 50 MG capsule Take 1 capsule (50 mg total) by mouth at bedtime. 30 capsule 2  . lamoTRIgine (LAMICTAL) 150 MG tablet  Take 1/2 tab twice daily 90 tablet 0  . lidocaine (XYLOCAINE) 5 % ointment Apply 1 application topically 2 (two) times daily as needed. (Patient taking differently: Apply 1 application topically 2 (two) times daily as needed for mild pain. ) 35.44 g 0  .  omeprazole (PRILOSEC) 40 MG capsule TAKE 1 CAPSULE BY MOUTH EVERY DAY (Patient taking differently: Take 40 mg by mouth daily. ) 90 capsule 3  . OXYGEN Inhale 5 L into the lungs.     . promethazine (PHENERGAN) 12.5 MG tablet TAKE 1 TABLET BY MOUTH EVERY 6 HOURS AS NEEDED FOR NAUSEA OR VOMITING. 90 tablet 1  . SYMBICORT 160-4.5 MCG/ACT inhaler Inhale 2 puffs into the lungs in the morning and at bedtime. 1 each 3  . valsartan (DIOVAN) 80 MG tablet Take 2 tablets (160 mg total) by mouth daily. (Patient taking differently: Take 240 mg by mouth daily. ) 180 tablet 3  . vitamin B-12 (CYANOCOBALAMIN) 1000 MCG tablet Take 1 tablet (1,000 mcg total) by mouth daily. 30 tablet 3  . Vitamin E 400 units TABS Take 400 Units by mouth daily.     . diclofenac Sodium (VOLTAREN) 1 % GEL Apply 2 g topically 4 (four) times daily. PRN for knee pain 4 g 1  . torsemide (DEMADEX) 20 MG tablet Take 2 tablets (40 mg total) by mouth in the morning and at bedtime. 60 tablet 0   No facility-administered medications prior to visit.    Allergies  Allergen Reactions  . Lisinopril Rash and Cough     ROS Review of Systems Negative except mentioned in HPI and assessment and plan.   Objective:    BP 120/89 (BP Location: Left Arm, Patient Position: Sitting, Cuff Size: Normal)   Pulse (!) 117   Temp 98.9 F (37.2 C) (Oral)   Ht 5\' 11"  (1.803 m)   Wt (!) 403 lb 6.4 oz (183 kg)   LMP 12/07/2019   SpO2 90%   BMI 56.26 kg/m  Wt Readings from Last 3 Encounters:  12/16/19 (!) 403 lb 6.4 oz (183 kg)  12/08/19 (!) 406 lb 14.4 oz (184.6 kg)  11/29/19 (!) 403 lb 12.8 oz (183.2 kg)   Physical Exam Constitutional:      General: She is not in acute distress.    Appearance:  She is obese. She is not ill-appearing.  Cardiovascular:     Rate and Rhythm: Tachycardia present.     Pulses: Normal pulses.     Heart sounds: Normal heart sounds. No murmur heard.   Pulmonary:     Effort: Pulmonary effort is normal.     Breath sounds: Normal breath sounds. No wheezing or rales.  Abdominal:     General: There is distension.     Palpations: Abdomen is soft.     Tenderness: There is no abdominal tenderness.     Comments: Abdomen is soft.  Distention is improved.  Musculoskeletal:     Right lower leg: Edema present.     Left lower leg: Edema present.     Comments: Tenderness of knees.    Neurological:     Mental Status: She is alert and oriented to person, place, and time. Mental status is at baseline.  Psychiatric:        Mood and Affect: Mood normal.        Behavior: Behavior normal.        Thought Content: Thought content normal.        Judgment: Judgment normal.    Health Maintenance Due  Topic Date Due  . Hepatitis C Screening  Never done    There are no preventive care reminders to display for this patient.  Lab Results  Component Value Date   TSH 1.140 01/08/2016   Lab Results  Component  Value Date   WBC 8.2 11/29/2019   HGB 11.6 (L) 11/29/2019   HCT 37.7 11/29/2019   MCV 86.1 11/29/2019   PLT 398 11/29/2019   Lab Results  Component Value Date   NA 141 12/16/2019   K 3.0 (L) 12/16/2019   CO2 32 12/16/2019   GLUCOSE 155 (H) 12/16/2019   BUN 34 (H) 12/16/2019   CREATININE 1.99 (H) 12/16/2019   BILITOT 0.7 11/26/2019   ALKPHOS 49 11/26/2019   AST 12 (L) 11/26/2019   ALT 15 11/26/2019   PROT 7.2 11/26/2019   ALBUMIN 3.8 11/26/2019   CALCIUM 9.4 12/16/2019   ANIONGAP 12 12/16/2019   Lab Results  Component Value Date   CHOL 139 05/06/2017   Lab Results  Component Value Date   HDL 69 05/06/2017   Lab Results  Component Value Date   LDLCALC 61 05/06/2017   Lab Results  Component Value Date   TRIG 47 05/06/2017   Lab  Results  Component Value Date   CHOLHDL 2.0 05/06/2017   Lab Results  Component Value Date   HGBA1C 5.5 05/06/2017      Assessment & Plan:   Problem List Items Addressed This Visit      Cardiovascular and Mediastinum   Acute on chronic systolic heart failure ([place]) - Primary    Heart failure: This is the second follow-up visit for acute on chronic heart failure exacerbation.   10/28: (Patient was hospitalized 10/28-11/1 for acute respiratory failure due to acute on chronic heart failure exacerbation.  She is on 3-4 L of oxygen at baseline.  Weight on discharge 183.2 kg. Last echo on February 2021 showed EF 45-50%, with global hypokinesis.  No prior ischemic work-up.  Patient was previously seen by Dr. Stanford Breed but lost to follow-up due to multiple no-shows.   12/09/2019: Post hospital follow-up: Patient recalculated fluid in her lower extremity and abdomen, and her weight was up again. Patient's diuretic switched from Lasix to torsemide given volume overload, abdominal swelling.  Today: She reports adherence to torsemide. She lost 3lb after we switched her to increased dose of Torsemide (Weight today: 184.2 which is 1.6 kg down from last visit and after up titrating diuretic dose.)  Volume status today:  Abdominal distension and LEE decreased.  -BMP stat ADDENDUM: BMP shows hypokalemia and bump in Cr and BUN. She is at her dry weight and probably mildly over diuresed now. 1-Holding Torsemide this PM and tomorrow and will continue with decreased dose of 40 mg Torsemide QD starting 12/18/2019 2-Sending prescription for Kcl 40 meq QD x 3 days 3-Will stop HCTZ. 4-Starting low dose BB: Coreg 3.125 mg BID (HR 117 today) 5-Continue valsartan 160 mg daily 6- F/u in clinic in 1 week I called patient and informed her about the plan. She agrees with the changes. BMP Latest Ref Rng & Units 12/16/2019 12/08/2019 11/29/2019  Glucose 70 - 99 mg/dL 155(H) 97 117(H)  BUN 6 - 20 mg/dL 34(H) 9  20  Creatinine 0.44 - 1.00 mg/dL 1.99(H) 1.12(H) 1.28(H)  BUN/Creat Ratio 9 - 23 - - -  Sodium 135 - 145 mmol/L 141 140 137  Potassium 3.5 - 5.1 mmol/L 3.0(L) 3.8 4.7  Chloride 98 - 111 mmol/L 97(L) 102 98  CO2 22 - 32 mmol/L 32 25 30  Calcium 8.9 - 10.3 mg/dL 9.4 9.6 9.1        Relevant Medications   torsemide (DEMADEX) 20 MG tablet (Start on 12/18/2019)   carvedilol (COREG) 3.125 MG tablet  Other Relevant Orders   BMP w Anion Gap (STAT/Sunquest-performed on-site) (Completed)   Magnesium   Essential hypertension (Chronic)    BP today nl at 120/89. On Amlodipine and HCTZ and Valzartan.  -BMP stat>showed hypokalemia -DC HCTZ -Continue Valzartan -Starting coreg 3.125 mg BID      Relevant Medications   torsemide (DEMADEX) 20 MG tablet (Start on 12/18/2019)   carvedilol (COREG) 3.125 MG tablet     Other   Pain in both knees   Leg pain, bilateral    Patient reports bilateral proximal leg muscle pain.  She also has bilateral chronic knee pain and referred to ortho (has an appointment on 12/3, per pt). Her muscle pain started about 5 days ago. Since her diuretic dose increased, we are checking BMP today to see if she has hypokalemia that could have caused muscle pain.  The pain is both at rest and activity and not claudication type to be suspicious for ischemic pain. .... She is morbidly obese and with sever chronic knee pain/OA. Her bilateral proximal leg pain can also be referral pain from her knees.  -BMP stat -F/u with ortho -Continue Volatern gel (patient asks for higher dose. I called the pharmacy and they have Voltaren gel 3% available. Changed the script)  ADDENDUM: K:3. Held torsemide x 2 days and then continue w decreased dose. Sending scrip for K supplement and checking Mg.          Filed Weights   12/16/19 0903  Weight: (!) 403 lb 6.4 oz (183 kg)     Meds ordered this encounter  Medications  . potassium chloride 20 MEQ TBCR    Sig: Take 40 mEq by mouth  daily for 3 days.    Dispense:  3 tablet    Refill:  0  . DISCONTD: diclofenac Sodium (VOLTAREN) 1 % GEL    Sig: Apply 2 g topically 4 (four) times daily. PRN for knee pain    Dispense:  4 g    Refill:  1  . torsemide (DEMADEX) 20 MG tablet    Sig: Take 2 tablets (40 mg total) by mouth daily.    Dispense:  60 tablet    Refill:  0  . carvedilol (COREG) 3.125 MG tablet    Sig: Take 1 tablet (3.125 mg total) by mouth 2 (two) times daily.    Dispense:  60 tablet    Refill:  0    Follow-up: Return in about 1 week (around 12/23/2019) for HF and BP f/u, BMP.   Patient discussed with Dr.Guilloud.  Dewayne Hatch, MD

## 2019-12-16 NOTE — Assessment & Plan Note (Addendum)
Patient reports bilateral proximal leg muscle pain.  She also has bilateral chronic knee pain and referred to ortho (has an appointment on 12/3, per pt). Her muscle pain started about 5 days ago. Since her diuretic dose increased, we are checking BMP today to see if she has hypokalemia that could have caused muscle pain.  The pain is both at rest and activity and not claudication type to be suspicious for ischemic pain. .... She is morbidly obese and with sever chronic knee pain/OA. Her bilateral proximal leg pain can also be referral pain from her knees.  -BMP stat -F/u with ortho -Continue Volatern gel (patient asks for higher dose. I called the pharmacy and they have Voltaren gel 3% available. Changed the script)  ADDENDUM: K:3. Held torsemide x 2 days and then continue w decreased dose. Sending scrip for K supplement and checking Mg.

## 2019-12-17 ENCOUNTER — Encounter: Payer: Self-pay | Admitting: Internal Medicine

## 2019-12-17 ENCOUNTER — Other Ambulatory Visit: Payer: Self-pay | Admitting: Internal Medicine

## 2019-12-17 DIAGNOSIS — J449 Chronic obstructive pulmonary disease, unspecified: Secondary | ICD-10-CM | POA: Diagnosis not present

## 2019-12-17 DIAGNOSIS — N179 Acute kidney failure, unspecified: Secondary | ICD-10-CM | POA: Diagnosis not present

## 2019-12-17 DIAGNOSIS — G4733 Obstructive sleep apnea (adult) (pediatric): Secondary | ICD-10-CM | POA: Diagnosis not present

## 2019-12-17 DIAGNOSIS — N189 Chronic kidney disease, unspecified: Secondary | ICD-10-CM | POA: Diagnosis not present

## 2019-12-17 DIAGNOSIS — D631 Anemia in chronic kidney disease: Secondary | ICD-10-CM | POA: Diagnosis not present

## 2019-12-17 DIAGNOSIS — D509 Iron deficiency anemia, unspecified: Secondary | ICD-10-CM | POA: Diagnosis not present

## 2019-12-17 DIAGNOSIS — M545 Low back pain, unspecified: Secondary | ICD-10-CM | POA: Diagnosis not present

## 2019-12-17 DIAGNOSIS — I5023 Acute on chronic systolic (congestive) heart failure: Secondary | ICD-10-CM | POA: Diagnosis not present

## 2019-12-17 DIAGNOSIS — F32A Depression, unspecified: Secondary | ICD-10-CM | POA: Diagnosis not present

## 2019-12-17 DIAGNOSIS — K219 Gastro-esophageal reflux disease without esophagitis: Secondary | ICD-10-CM | POA: Diagnosis not present

## 2019-12-17 DIAGNOSIS — I130 Hypertensive heart and chronic kidney disease with heart failure and stage 1 through stage 4 chronic kidney disease, or unspecified chronic kidney disease: Secondary | ICD-10-CM | POA: Diagnosis not present

## 2019-12-17 DIAGNOSIS — J9611 Chronic respiratory failure with hypoxia: Secondary | ICD-10-CM | POA: Diagnosis not present

## 2019-12-17 LAB — MAGNESIUM: Magnesium: 2 mg/dL (ref 1.6–2.3)

## 2019-12-17 MED ORDER — POTASSIUM CHLORIDE ER 20 MEQ PO TBCR: 40.0000 meq | EXTENDED_RELEASE_TABLET | Freq: Every day | ORAL | 0 refills | Status: DC

## 2019-12-18 ENCOUNTER — Other Ambulatory Visit: Payer: Self-pay | Admitting: Primary Care

## 2019-12-20 DIAGNOSIS — N189 Chronic kidney disease, unspecified: Secondary | ICD-10-CM | POA: Diagnosis not present

## 2019-12-20 DIAGNOSIS — M545 Low back pain, unspecified: Secondary | ICD-10-CM | POA: Diagnosis not present

## 2019-12-20 DIAGNOSIS — I130 Hypertensive heart and chronic kidney disease with heart failure and stage 1 through stage 4 chronic kidney disease, or unspecified chronic kidney disease: Secondary | ICD-10-CM | POA: Diagnosis not present

## 2019-12-20 DIAGNOSIS — K219 Gastro-esophageal reflux disease without esophagitis: Secondary | ICD-10-CM | POA: Diagnosis not present

## 2019-12-20 DIAGNOSIS — D509 Iron deficiency anemia, unspecified: Secondary | ICD-10-CM | POA: Diagnosis not present

## 2019-12-20 DIAGNOSIS — I5023 Acute on chronic systolic (congestive) heart failure: Secondary | ICD-10-CM | POA: Diagnosis not present

## 2019-12-20 DIAGNOSIS — J9611 Chronic respiratory failure with hypoxia: Secondary | ICD-10-CM | POA: Diagnosis not present

## 2019-12-20 DIAGNOSIS — F32A Depression, unspecified: Secondary | ICD-10-CM | POA: Diagnosis not present

## 2019-12-20 DIAGNOSIS — J449 Chronic obstructive pulmonary disease, unspecified: Secondary | ICD-10-CM | POA: Diagnosis not present

## 2019-12-20 DIAGNOSIS — N179 Acute kidney failure, unspecified: Secondary | ICD-10-CM | POA: Diagnosis not present

## 2019-12-20 DIAGNOSIS — D631 Anemia in chronic kidney disease: Secondary | ICD-10-CM | POA: Diagnosis not present

## 2019-12-20 DIAGNOSIS — G4733 Obstructive sleep apnea (adult) (pediatric): Secondary | ICD-10-CM | POA: Diagnosis not present

## 2019-12-20 NOTE — Progress Notes (Signed)
Internal Medicine Clinic Attending  Case discussed with Dr. Masoudi  At the time of the visit.  We reviewed the resident's history and exam and pertinent patient test results.  I agree with the assessment, diagnosis, and plan of care documented in the resident's note.  

## 2019-12-21 ENCOUNTER — Ambulatory Visit (INDEPENDENT_AMBULATORY_CARE_PROVIDER_SITE_OTHER): Payer: Medicare Other | Admitting: Licensed Clinical Social Worker

## 2019-12-21 ENCOUNTER — Other Ambulatory Visit: Payer: Self-pay | Admitting: Internal Medicine

## 2019-12-21 ENCOUNTER — Other Ambulatory Visit (HOSPITAL_COMMUNITY): Payer: Self-pay | Admitting: Psychiatry

## 2019-12-21 DIAGNOSIS — F4312 Post-traumatic stress disorder, chronic: Secondary | ICD-10-CM

## 2019-12-21 DIAGNOSIS — F331 Major depressive disorder, recurrent, moderate: Secondary | ICD-10-CM | POA: Diagnosis not present

## 2019-12-21 DIAGNOSIS — I5023 Acute on chronic systolic (congestive) heart failure: Secondary | ICD-10-CM

## 2019-12-21 DIAGNOSIS — F411 Generalized anxiety disorder: Secondary | ICD-10-CM | POA: Diagnosis not present

## 2019-12-21 DIAGNOSIS — F410 Panic disorder [episodic paroxysmal anxiety] without agoraphobia: Secondary | ICD-10-CM

## 2019-12-21 NOTE — Telephone Encounter (Signed)
Looks like her torsemide was just refilled by Dr. Myrtie Hawk 5 days ago. Is she already out?

## 2019-12-21 NOTE — Progress Notes (Signed)
Virtual Visit via Telephone Note  I connected with Lindsay [name] on 12/21/19 at  8:00 AM EST by telephone and verified that I am speaking with the correct person using two identifiers.  Location: Patient: home Provider: home office   I discussed the limitations, risks, security and privacy concerns of performing an evaluation and management service by telephone and the availability of in person appointments. I also discussed with the patient that there may be a patient responsible charge related to this service. The patient expressed understanding and agreed to proceed.  I discussed the assessment and treatment plan with the patient. The patient was provided an opportunity to ask questions and all were answered. The patient agreed with the plan and demonstrated an understanding of the instructions.   The patient was advised to call back or seek an in-person evaluation if the symptoms worsen or if the condition fails to improve as anticipated.  I provided 54 minutes of non-face-to-face time during this encounter.  THERAPIST PROGRESS NOTE  Session Time: 8:00 AM to 8:54 AM  Participation Level: Active  Behavioral Response: CasualAlertAnxious and Dysphoric  Type of Therapy: Individual Therapy  Treatment Goals addressed:  help cope with health issues, anxiety, triggers, coping  Interventions: Solution Focused, Strength-based, Supportive and Other: coping  Summary: Lindsay [name] is a 49 y.o. female who presents with kind of depressing to her because of heart, being in pain and heart failure is a lot of grasp, being sick and in pain and then heart failure. Going to go to heart specialist this afternoon. Explored what could make her feel better. Nothing right now feeling good. Has a physical therapist coming to the house due to knee pain after the fall. She comes twice a week. Needs a better home heath aid. Signed up for "[facility] and will have therapist and mental health coach and it is done  through Dual complete eight week program through her insurance. Discussed this as offering possible helpful coping strategies. Therapist this as possibly helpful through a difficult time. Patient shared that she used to take pictures a lot when going to therapy. Discussed doing that when she more able to get around. Patient wanted to share what was bothering her. Things at hospital that got brushed under the rug sitting on bidet and got injured, think it was broke (can't stop thinking of it, scared to have a stool, sometimes it bleeds down there, cried last night because rectum hurt), bed pan instead of a bucket, wake up with blood in mouth, blood clot on lips, spitting up blood. Patient's plan is to report it and not going to keep happening to her, not when they keep brushing it off, wants to tell somebody so they fix it.  Thinking about mouth cancer, big growth on teeth on bottom make it looser. Has to go to oral surgeon for that. Heart issue come up and been going there every week. Not focused on issues because of heart issue. Scared to go to dentist make her go to sleep forever.  Therapist pointed patient thinking this case scenario for situations understandable given her medical struggles but at the same time not allowing her fears to think the worst but seeking informed opinions of doctors.       Suicidal/Homicidal: No  Therapist Response: Therapist reviewed symptoms, facilitated expression of thoughts and feelings as a therapeutic intervention to help patient cope with significant medical issues causing her to be anxious and depressed.  Encourage patient to seek guidance from doctor,  noted her thinking the worst case scenario understandable given her struggles with medical issues but getting a more accurate picture through their assessment.  Therapist shared her view was helpful for patient to go see heart specialist this afternoon to find a doctor who is skilled in managing her issues.  Reviewed given  different issues helpful strategy of doing 1 thing at a time, processed patient's feelings related to issues at hospital helping her work through her anger and frustration reviewed coping strategy of reporting issues, doing something about it helpful as it helps to do something about a situation as a way to cope.  Reviewed some coping strategies to focus on in the future for patient would be helpful including distraction and patient's love of photography.  Therapist provided active listening, open questions supportive interventions  Plan: Return again in 4 weeks.2.Therapist work with patient on depression, anxiety, processing through feelings related to past, managing health issues  Diagnosis: Axis I:  Major depressive disorder, recurrent, moderate, generalized anxiety disorder, chronic PTSD, panic attacks    Axis II: No diagnosis    [name], LCSW 12/21/2019

## 2019-12-25 ENCOUNTER — Other Ambulatory Visit: Payer: Self-pay | Admitting: Internal Medicine

## 2019-12-25 DIAGNOSIS — J9601 Acute respiratory failure with hypoxia: Secondary | ICD-10-CM

## 2019-12-26 DIAGNOSIS — J9620 Acute and chronic respiratory failure, unspecified whether with hypoxia or hypercapnia: Secondary | ICD-10-CM | POA: Diagnosis not present

## 2019-12-27 NOTE — Telephone Encounter (Signed)
Prescription changed to 40 mg (2 tablets) once a day per Dr. Darcey Nora last note on 11/18. Just FYI.

## 2019-12-27 NOTE — Telephone Encounter (Signed)
I think I just refilled her diuretic this morning. Was there an issue with the prescription?

## 2019-12-28 ENCOUNTER — Emergency Department (HOSPITAL_COMMUNITY): Payer: Medicare Other

## 2019-12-28 ENCOUNTER — Emergency Department (HOSPITAL_COMMUNITY)
Admission: EM | Admit: 2019-12-28 | Discharge: 2019-12-28 | Disposition: A | Discharge status: Home / Self Care | Payer: Medicare Other | Attending: Emergency Medicine | Admitting: Emergency Medicine

## 2019-12-28 ENCOUNTER — Encounter (HOSPITAL_COMMUNITY): Payer: Self-pay | Admitting: Emergency Medicine

## 2019-12-28 ENCOUNTER — Other Ambulatory Visit: Payer: Self-pay

## 2019-12-28 DIAGNOSIS — Z79899 Other long term (current) drug therapy: Secondary | ICD-10-CM | POA: Insufficient documentation

## 2019-12-28 DIAGNOSIS — R600 Localized edema: Secondary | ICD-10-CM | POA: Diagnosis not present

## 2019-12-28 DIAGNOSIS — R079 Chest pain, unspecified: Secondary | ICD-10-CM | POA: Diagnosis not present

## 2019-12-28 DIAGNOSIS — J9610 Chronic respiratory failure, unspecified whether with hypoxia or hypercapnia: Secondary | ICD-10-CM | POA: Diagnosis not present

## 2019-12-28 DIAGNOSIS — Z7952 Long term (current) use of systemic steroids: Secondary | ICD-10-CM | POA: Insufficient documentation

## 2019-12-28 DIAGNOSIS — N939 Abnormal uterine and vaginal bleeding, unspecified: Secondary | ICD-10-CM | POA: Insufficient documentation

## 2019-12-28 DIAGNOSIS — E876 Hypokalemia: Secondary | ICD-10-CM | POA: Insufficient documentation

## 2019-12-28 DIAGNOSIS — Z87891 Personal history of nicotine dependence: Secondary | ICD-10-CM | POA: Diagnosis not present

## 2019-12-28 DIAGNOSIS — N189 Chronic kidney disease, unspecified: Secondary | ICD-10-CM | POA: Insufficient documentation

## 2019-12-28 DIAGNOSIS — R0602 Shortness of breath: Secondary | ICD-10-CM

## 2019-12-28 DIAGNOSIS — I130 Hypertensive heart and chronic kidney disease with heart failure and stage 1 through stage 4 chronic kidney disease, or unspecified chronic kidney disease: Secondary | ICD-10-CM | POA: Insufficient documentation

## 2019-12-28 DIAGNOSIS — I5023 Acute on chronic systolic (congestive) heart failure: Secondary | ICD-10-CM | POA: Insufficient documentation

## 2019-12-28 DIAGNOSIS — R000 Tachycardia, unspecified: Secondary | ICD-10-CM | POA: Diagnosis not present

## 2019-12-28 DIAGNOSIS — R140 Abdominal distension (gaseous): Secondary | ICD-10-CM | POA: Diagnosis not present

## 2019-12-28 LAB — BASIC METABOLIC PANEL
Anion gap: 14 (ref 5–15)
BUN: 14 mg/dL (ref 6–20)
CO2: 27 mmol/L (ref 22–32)
Calcium: 9.5 mg/dL (ref 8.9–10.3)
Chloride: 99 mmol/L (ref 98–111)
Creatinine, Ser: 1.26 mg/dL — ABNORMAL HIGH (ref 0.44–1.00)
GFR, Estimated: 52 mL/min — ABNORMAL LOW (ref >60)
Glucose, Bld: 106 mg/dL — ABNORMAL HIGH (ref 70–99)
Potassium: 3.3 mmol/L — ABNORMAL LOW (ref 3.5–5.1)
Sodium: 140 mmol/L (ref 135–145)

## 2019-12-28 LAB — CBC
HCT: 43.5 % (ref 36.0–46.0)
Hemoglobin: 13.2 g/dL (ref 12.0–15.0)
MCH: 25.8 pg — ABNORMAL LOW (ref 26.0–34.0)
MCHC: 30.3 g/dL (ref 30.0–36.0)
MCV: 85 fL (ref 80.0–100.0)
Platelets: 457 10*3/uL — ABNORMAL HIGH (ref 150–400)
RBC: 5.12 MIL/uL — ABNORMAL HIGH (ref 3.87–5.11)
RDW: 15.8 % — ABNORMAL HIGH (ref 11.5–15.5)
WBC: 9.4 10*3/uL (ref 4.0–10.5)
nRBC: 0 % (ref 0.0–0.2)

## 2019-12-28 LAB — URINALYSIS, ROUTINE W REFLEX MICROSCOPIC
Bilirubin Urine: NEGATIVE
Glucose, UA: NEGATIVE mg/dL
Ketones, ur: NEGATIVE mg/dL
Leukocytes,Ua: NEGATIVE
Nitrite: NEGATIVE
Protein, ur: NEGATIVE mg/dL
Specific Gravity, Urine: 1.021 (ref 1.005–1.030)
pH: 5 (ref 5.0–8.0)

## 2019-12-28 LAB — TROPONIN I (HIGH SENSITIVITY)
Troponin I (High Sensitivity): 5 ng/L (ref <18)
Troponin I (High Sensitivity): 4 ng/L (ref <18)

## 2019-12-28 LAB — WET PREP, GENITAL
Clue Cells Wet Prep HPF POC: NONE SEEN
Sperm: NONE SEEN
Trich, Wet Prep: NONE SEEN
Yeast Wet Prep HPF POC: NONE SEEN

## 2019-12-28 LAB — I-STAT BETA HCG BLOOD, ED (MC, WL, AP ONLY): I-stat hCG, quantitative: <5 m[IU]/mL (ref <5)

## 2019-12-28 LAB — BRAIN NATRIURETIC PEPTIDE: B Natriuretic Peptide: 38.7 pg/mL (ref 0.0–100.0)

## 2019-12-28 IMAGING — CR DG CHEST 2V
2 series · 2 of 2 positions shown · non-contrast
Comparison: [DATE]

CLINICAL DATA: Shortness of breath, chest pain

EXAM:
CHEST - 2 VIEW

[chest lat]
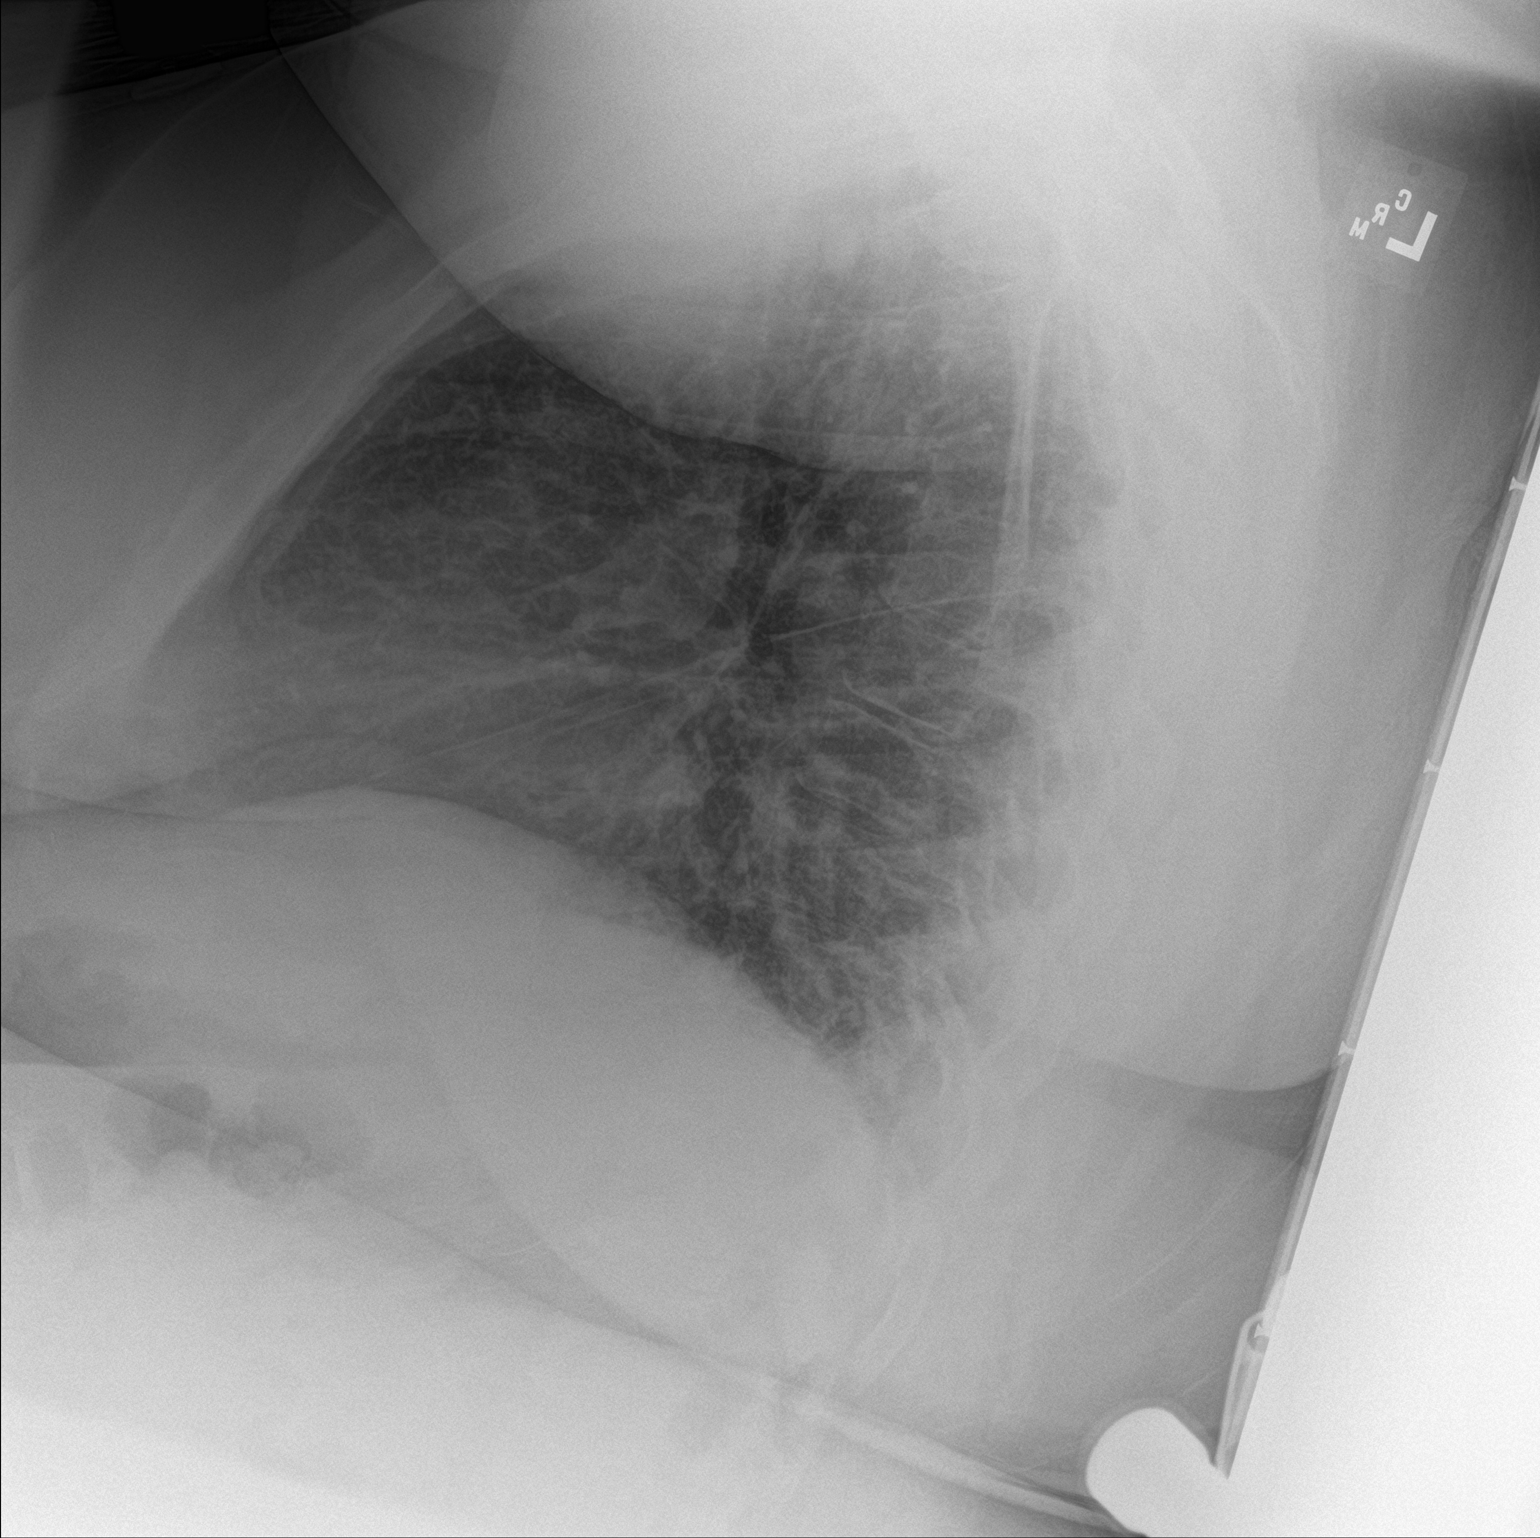

[chest ap]
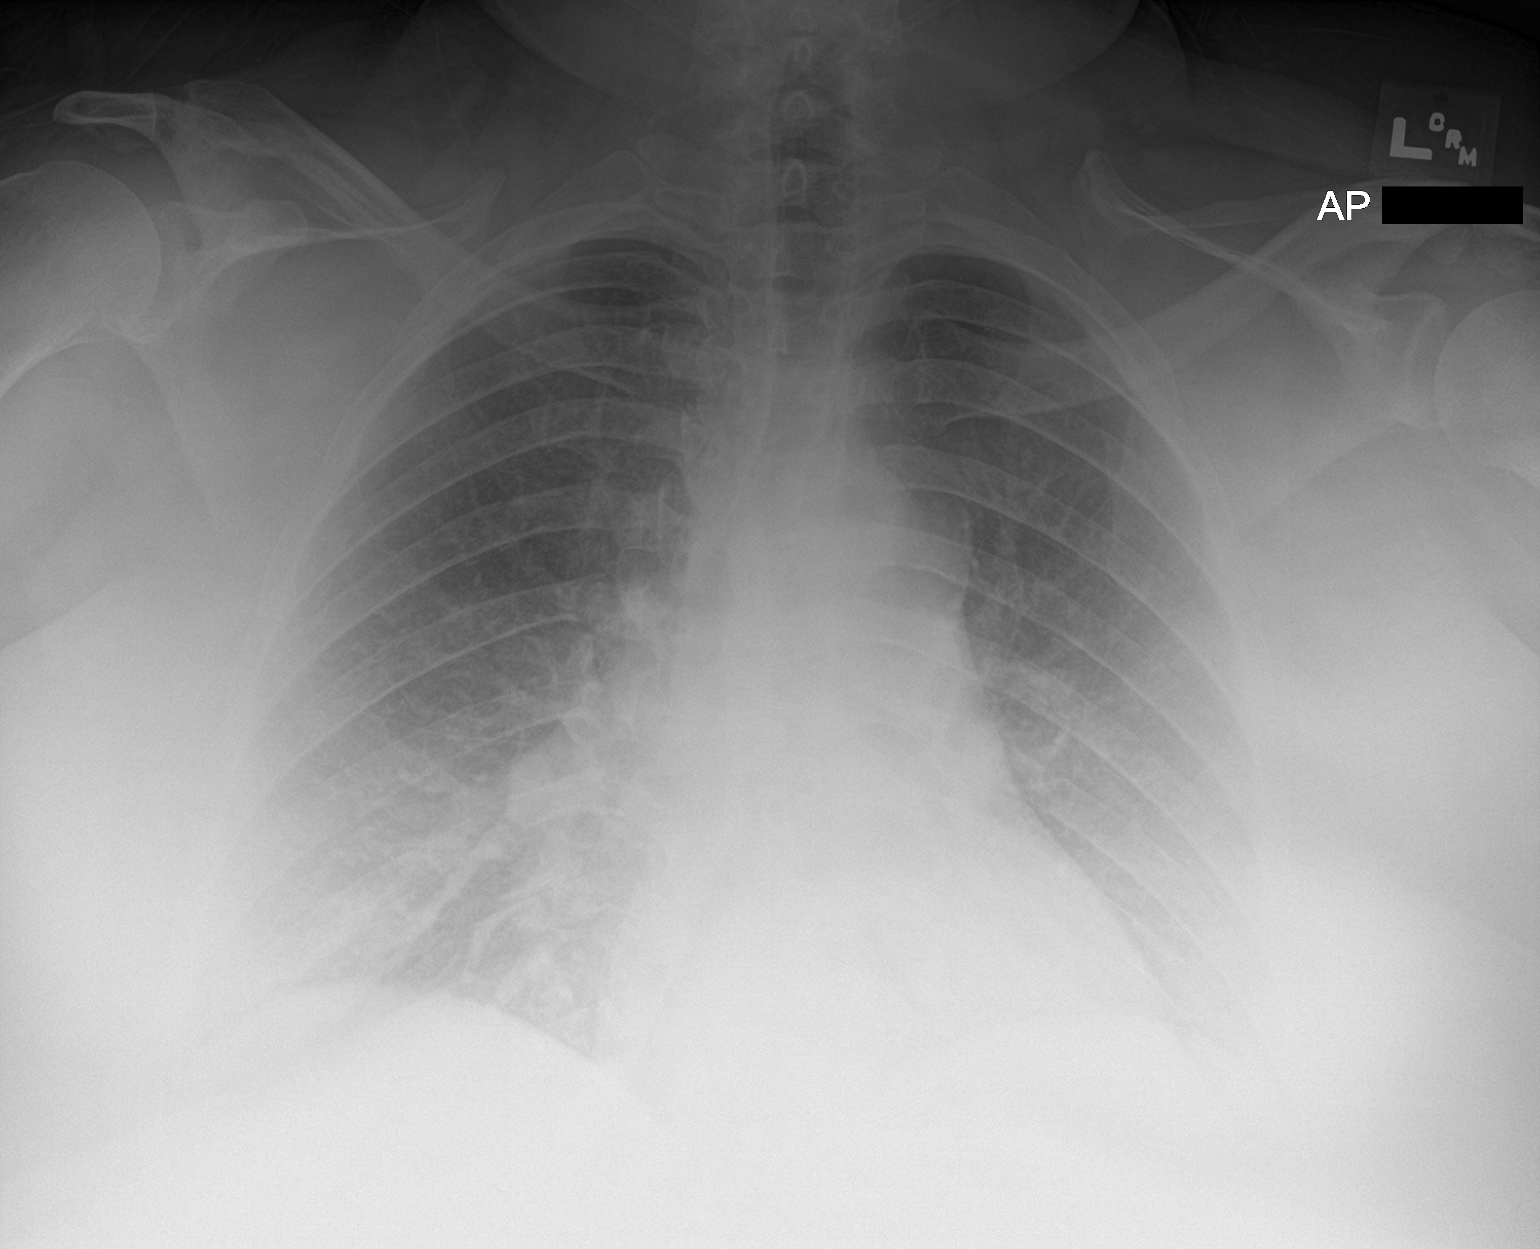

[2 of 2 positions shown; findings below may reference images not displayed]

FINDINGS: Suboptimal evaluation due to body habitus. No definite consolidation
or edema. No pleural effusion. No pneumothorax. Cardiomediastinal
contours are within normal limits.
IMPRESSION: No acute process in the chest.

## 2019-12-28 MED ORDER — FUROSEMIDE 10 MG/ML IJ SOLN
40.0000 mg | Freq: Once | INTRAMUSCULAR | Status: AC
  Administered 2019-12-28: 40 mg via INTRAVENOUS
  Filled 2019-12-28: qty 4

## 2019-12-28 MED ORDER — ONDANSETRON HCL 4 MG/2ML IJ SOLN
4.0000 mg | Freq: Once | INTRAMUSCULAR | Status: AC
  Administered 2019-12-28: 4 mg via INTRAVENOUS
  Filled 2019-12-28: qty 2

## 2019-12-28 MED ORDER — POTASSIUM CHLORIDE CRYS ER 20 MEQ PO TBCR
60.0000 meq | EXTENDED_RELEASE_TABLET | Freq: Once | ORAL | Status: AC
  Administered 2019-12-28: 60 meq via ORAL
  Filled 2019-12-28: qty 3

## 2019-12-28 MED ORDER — FENTANYL CITRATE (PF) 100 MCG/2ML IJ SOLN
50.0000 ug | Freq: Once | INTRAMUSCULAR | Status: AC
  Administered 2019-12-28: 50 ug via INTRAVENOUS
  Filled 2019-12-28: qty 2

## 2019-12-28 MED ORDER — MAGNESIUM OXIDE 400 (241.3 MG) MG PO TABS
800.0000 mg | ORAL_TABLET | Freq: Once | ORAL | Status: AC
  Administered 2019-12-28: 800 mg via ORAL
  Filled 2019-12-28: qty 2

## 2019-12-28 NOTE — Telephone Encounter (Signed)
Currently inpatient.

## 2019-12-28 NOTE — ED Notes (Signed)
[facility]-(19th patient on list) called @ 1652-per Mickel Baas, RN called by Levada Dy

## 2019-12-28 NOTE — ED Notes (Signed)
Pelvic cart at bedside. 

## 2019-12-28 NOTE — Discharge Instructions (Addendum)
Contact a health care provider if: Your shortness of breath gets worse and you cannot do the things you used to do. You have increased mucus (sputum), wheezing, coughing, or loss of energy. You are on oxygen therapy and you are starting to need more. You need to use your medicines more often. You have a fever. Get help right away if: Your shortness of breath becomes worse. You are unable to say more than a few words without having to catch your breath. You develop chest pain or tightness.

## 2019-12-28 NOTE — ED Provider Notes (Signed)
[place] EMERGENCY DEPARTMENT Provider Note   CSN: [id number] Arrival date & time: 12/28/19  1020     History Chief Complaint  Patient presents with  . Chest Pain  . Vaginal Bleeding    Lindsay [name] is a 49 y.o. female with a past medical history of morbid obesity, chronic heart failure, chronic respiratory failure, recurrent episodes of vaginal bleeding who presents the emergency department chief complaint of shortness of breath and vaginal bleeding.  She has had about an 8 pound weight gain over the past week.  She was discharged on 3 L of oxygen but has not had to turn it back up to 4 L.  She feels short of breath at rest and with exertion.  She denies active chest pain to me as opposed to the triage note.  The patient also complains of heavy vaginal bleeding.  She states that it has slowed some she was soaking 1 pad with a tampon hourly and is now soaking 1 pad and 1 tampon every 2 hours.  She has been passing heavy clots.  She states that she was hospitalized when she was supposed to follow-up with her GYN, Dr. Darron Doom, and that she scheduled appointment but they do not have any openings until January.  She denies pelvic pain.  HPI     Past Medical History:  Diagnosis Date  . Acid reflux   . Anemia    Iron Def  . Anorexia   . CHF (congestive heart failure) ([place])   . Chronic kidney disease    Nephrotic syndrome  . Colon polyp 2009  . Depression with anxiety   . Edema leg   . Fibromyalgia   . Hemorrhoids   . Hidradenitis suppurativa   . Hypertension   . IBS (irritable bowel syndrome)   . Low back pain   . Migraines   . Morbidly obese ([place])   . Neuromuscular disorder (HCC)    fibromyalgia  . Neuropathy   . Panic attacks   . Polyp of vocal cord or larynx   . Tonsil pain     Patient Active Problem List   Diagnosis Date Noted  . Pain in both knees 12/09/2019  . Shortness of breath   . Acute on chronic systolic heart failure ([place])  11/25/2019  . Acute on chronic heart failure ([place]) 11/25/2019  . OSA (obstructive sleep apnea) 06/14/2019  . Right shoulder pain 04/21/2019  . Abdominal pain, acute, epigastric 04/07/2019  . Pain in gums 03/15/2019  . Compression fracture of body of thoracic vertebra ([place]) 03/08/2019  . Acute respiratory failure ([place]) 03/06/2019  . Iron deficiency 02/10/2019  . History of colon polyps   . Vision blurring 09/17/2018  . Fall 06/30/2018  . Frequent urination at night 04/23/2018  . Chronic cough 03/04/2018  . Chronic back pain 12/05/2017  . Dental infection 12/05/2017  . Right arm pain 12/05/2017  . Chronic respiratory failure with hypoxia ([place])   . Low serum vitamin B12 05/02/2017  . Palpitations 04/11/2017  . Nutritional anemia 06/17/2016  . Allergic rhinitis 06/12/2016  . Vitamin D deficiency, unspecified 06/12/2016  . Nicotine dependence 06/12/2016  . Menorrhagia 01/08/2016  . Migraines 01/08/2016  . Leg pain, bilateral 11/29/2015  . Hemorrhoids 07/05/2015  . Fibromyalgia 07/05/2015  . Chronic leg pain 09/26/2014  . Healthcare maintenance 09/26/2014  . Morbid obesity ([place]) 07/04/2014  . Major depressive disorder, recurrent episode, moderate ([place]) 10/13/2012  . Generalized anxiety disorder 10/13/2012  . Vaginal yeast infection  11/27/2010  . Chronic nausea 05/10/2010  . IBS (irritable bowel syndrome) 06/16/2007  . Essential hypertension 02/06/2006  . Hidradenitis 11/18/2005    Past Surgical History:  Procedure Laterality Date  . AXILLARY HIDRADENITIS EXCISION    . COLONOSCOPY WITH PROPOFOL N/A 05/25/2015   Procedure: COLONOSCOPY WITH PROPOFOL;  Surgeon: Milus Banister, MD;  Location: [facility];  Service: Endoscopy;  Laterality: N/A;  . COLONOSCOPY WITH PROPOFOL N/A 12/31/2018   Procedure: COLONOSCOPY WITH PROPOFOL;  Surgeon: Milus Banister, MD;  Location: [facility] END[facility]COPY;  Service: Endoscopy;  Laterality: N/A;  . HEMORRHOID SURGERY     with Hidradenitis surgery   .  INGUINAL HIDRADENITIS EXCISION    . TONSILLECTOMY  10/18/2010   by Dr. Wilburn Cornelia  . UPPER GASTROINTESTINAL ENDOSCOPY       OB History    Gravida  2   Para  1   Term      Preterm      AB  1   Living        SAB      TAB  1   Ectopic      Multiple      Live Births              Family History  Problem Relation Age of Onset  . Diabetes Mother   . Heart disease Mother        valve leak  . Anxiety disorder Mother   . Depression Mother   . High blood pressure Mother   . Kidney failure Mother   . Cancer Father        prostate  . Heart disease Father   . Learning disabilities Sister   . Depression Sister   . Depression Sister   . Anxiety disorder Sister   . Colon cancer Neg Hx     Social History   Tobacco Use  . Smoking status: Former Smoker    Packs/day: 0.10    Years: 25.00    Pack years: 2.50    Types: Cigarettes    Quit date: 05/24/2019    Years since quitting: 0.5  . Smokeless tobacco: Never Used  . Tobacco comment: quit in April.  Vaping Use  . Vaping Use: Never used  Substance Use Topics  . Alcohol use: Not Currently    Alcohol/week: 0.0 standard drinks    Comment: none sine 05/2018  . Drug use: Not Currently    Frequency: 3.0 times per week    Types: Marijuana    Home Medications Prior to Admission medications   Medication Sig Start Date End Date Taking? Authorizing Provider  acetaminophen (TYLENOL) 650 MG CR tablet Take 1,300 mg by mouth every 8 (eight) hours as needed for pain.   Yes [provider]  albuterol (PROVENTIL) (2.5 MG/3ML) 0.083% nebulizer solution TAKE 3 MLS (2.5 MG TOTAL) BY NEBULIZATION EVERY 4 (FOUR) HOURS. 08/31/19  Yes Olalere, Adewale A, MD  albuterol (VENTOLIN HFA) 108 (90 Base) MCG/ACT inhaler TAKE 2 PUFFS BY MOUTH EVERY 6 HOURS AS NEEDED FOR WHEEZE OR SHORTNESS OF BREATH Patient taking differently: Inhale 2 puffs into the lungs every 6 (six) hours as needed for wheezing or shortness of breath.  10/14/19  Yes  Olalere, Adewale A, MD  amLODipine (NORVASC) 10 MG tablet TAKE 1/2 TABLET BY MOUTH EVERY DAY Patient taking differently: Take 5 mg by mouth daily.  08/13/19  Yes Velna Ochs, MD  Ascorbic Acid (VITAMIN C PO) Take 1 tablet by mouth daily.   Yes  [provider]  BIOTIN PO Take 1 tablet by mouth daily.   Yes [provider]  Brexpiprazole (REXULTI) 3 MG TABS Take 1 tablet (3 mg total) by mouth daily. 12/13/19  Yes [name], Arlyce Harman, MD  capsaicin (ZOSTRIX) 0.025 % cream Apply 1 application topically at bedtime.   Yes [provider]  Capsaicin 0.025 % PTCH Apply 1 patch topically daily.   Yes [provider]  carvedilol (COREG) 3.125 MG tablet Take 1 tablet (3.125 mg total) by mouth 2 (two) times daily. 12/16/19 12/15/20 Yes Masoudi, Elhamalsadat, MD  cholecalciferol (VITAMIN D) 25 MCG (1000 UNIT) tablet Take 1 tablet (1,000 Units total) by mouth daily. 07/23/19  Yes Velna Ochs, MD  clindamycin (CLINDAGEL) 1 % gel APPLY TO AFFECTED AREA TWICE A DAY Patient taking differently: Apply 1 application topically 2 (two) times daily.  12/10/19  Yes Velna Ochs, MD  clorazepate (TRANXENE) 3.75 MG tablet Take 1 tablet (3.75 mg total) by mouth daily. 12/13/19  Yes [name], Arlyce Harman, MD  hydrOXYzine (VISTARIL) 50 MG capsule Take 1 capsule (50 mg total) by mouth at bedtime. 12/13/19  Yes [name], Arlyce Harman, MD  lamoTRIgine (LAMICTAL) 150 MG tablet Take 1/2 tab twice daily Patient taking differently: Take 75 mg by mouth 2 (two) times daily.  12/13/19  Yes [name], Arlyce Harman, MD  lidocaine (XYLOCAINE) 5 % ointment Apply 1 application topically 2 (two) times daily as needed. Patient taking differently: Apply 1 application topically 2 (two) times daily as needed for mild pain.  11/03/19  Yes Axel Filler, MD  omeprazole (PRILOSEC) 40 MG capsule TAKE 1 CAPSULE BY MOUTH EVERY DAY Patient taking differently: Take 40 mg by mouth daily.  05/05/19  Yes Velna Ochs, MD    promethazine (PHENERGAN) 12.5 MG tablet TAKE 1 TABLET BY MOUTH EVERY 6 HOURS AS NEEDED FOR NAUSEA OR VOMITING. Patient taking differently: Take 12.5 mg by mouth every 6 (six) hours as needed for nausea or vomiting.  12/02/19  Yes Milus Banister, MD  SYMBICORT 160-4.5 MCG/ACT inhaler Inhale 2 puffs into the lungs in the morning and at bedtime. 09/23/19  Yes Rigoberto Noel, MD  torsemide (DEMADEX) 20 MG tablet Take 2 tablets (40 mg total) by mouth daily. 12/27/19  Yes Velna Ochs, MD  valsartan (DIOVAN) 80 MG tablet Take 2 tablets (160 mg total) by mouth daily. 06/14/19  Yes Mosetta Anis, MD  vitamin B-12 (CYANOCOBALAMIN) 1000 MCG tablet Take 1 tablet (1,000 mcg total) by mouth daily. 05/06/17  Yes Velna Ochs, MD  Vitamin E 400 units TABS Take 400 Units by mouth daily.    Yes [provider]  OXYGEN Inhale 5 L into the lungs.     [provider]  Potassium Chloride ER 20 MEQ TBCR Take 40 mEq by mouth daily for 3 days. 12/17/19 12/20/19  Masoudi, Dorthula Rue, MD    Allergies    Lisinopril  Review of Systems   Review of Systems Ten systems reviewed and are negative for acute change, except as noted in the HPI.   Physical Exam Updated Vital Signs BP 96/64 (BP Location: Right Wrist)   Pulse 91   Temp 99.5 F (37.5 C) (Oral)   Resp (!) 21   Ht 5\' 11"  (1.803 m)   Wt (!) 182.8 kg   LMP 12/07/2019   SpO2 100%   BMI 56.21 kg/m   Physical Exam Vitals and nursing note reviewed. Exam conducted with a chaperone present.  Constitutional:  General: She is not in acute distress.    Appearance: She is well-developed. She is obese. She is not diaphoretic.  HENT:     Head: Normocephalic and atraumatic.  Eyes:     General: No scleral icterus.    Extraocular Movements: Extraocular movements intact.     Conjunctiva/sclera: Conjunctivae normal.     Pupils: Pupils are equal, round, and reactive to light.  Cardiovascular:     Rate and Rhythm: Regular rhythm.  Tachycardia present.     Heart sounds: Normal heart sounds. No murmur heard.  No friction rub. No gallop.   Pulmonary:     Effort: Pulmonary effort is normal. Tachypnea present. No respiratory distress.     Breath sounds: Normal breath sounds.     Comments: Unable to assess breath sounds due to body habitus Abdominal:     General: Abdomen is protuberant. Bowel sounds are normal. There is distension.     Palpations: Abdomen is soft. There is no mass.     Tenderness: There is no abdominal tenderness. There is no guarding.  Genitourinary:    Vagina: Normal.     Cervix: Cervical bleeding present. No cervical motion tenderness.  Musculoskeletal:     Cervical back: Normal range of motion.     Right lower leg: Edema present.     Left lower leg: Edema present.  Skin:    General: Skin is warm and dry.  Neurological:     Mental Status: She is alert and oriented to person, place, and time.  Psychiatric:        Behavior: Behavior normal.     ED Results / Procedures / Treatments   Labs (all labs ordered are listed, but only abnormal results are displayed) Labs Reviewed  WET PREP, GENITAL - Abnormal; Notable for the following components:      Result Value   WBC, Wet Prep HPF POC RARE (*)    All other components within normal limits  BASIC METABOLIC PANEL - Abnormal; Notable for the following components:   Potassium 3.3 (*)    Glucose, Bld 106 (*)    Creatinine, Ser 1.26 (*)    GFR, Estimated 52 (*)    All other components within normal limits  CBC - Abnormal; Notable for the following components:   RBC 5.12 (*)    MCH 25.8 (*)    RDW 15.8 (*)    Platelets 457 (*)    All other components within normal limits  URINALYSIS, ROUTINE W REFLEX MICROSCOPIC - Abnormal; Notable for the following components:   Hgb urine dipstick LARGE (*)    Bacteria, UA RARE (*)    All other components within normal limits  BRAIN NATRIURETIC PEPTIDE  I-STAT BETA HCG BLOOD, ED ([facility], [facility], [facility] ONLY)    GC/CHLAMYDIA PROBE AMP ([place]) NOT AT [facility]  TROPONIN I (HIGH SENSITIVITY)  TROPONIN I (HIGH SENSITIVITY)    EKG None  Radiology DG Chest 2 View  Result Date: 12/28/2019 CLINICAL DATA:  Shortness of breath, chest pain EXAM: CHEST - 2 VIEW COMPARISON:  11/26/2019 FINDINGS: Suboptimal evaluation due to body habitus. No definite consolidation or edema. No pleural effusion. No pneumothorax. Cardiomediastinal contours are within normal limits. IMPRESSION: No acute process in the chest. Electronically Signed   By: Macy Mis M.D.   On: 12/28/2019 11:29    Procedures Procedures (including critical care time)  Medications Ordered in ED Medications  fentaNYL (SUBLIMAZE) injection 50 mcg (50 mcg Intravenous Given 12/28/19 1514)  ondansetron (ZOFRAN) injection 4  mg (4 mg Intravenous Given 12/28/19 1514)  potassium chloride SA (KLOR-CON) CR tablet 60 mEq (60 mEq Oral Given 12/28/19 1714)  magnesium oxide (MAG-OX) tablet 800 mg (800 mg Oral Given 12/28/19 1715)  furosemide (LASIX) injection 40 mg (40 mg Intravenous Given 12/28/19 1715)    ED Course  I have reviewed the triage vital signs and the nursing notes.  Pertinent labs & imaging results that were available during my care of the patient were reviewed by me and considered in my medical decision making (see chart for details).    MDM Rules/Calculators/A&P                         CC: Shortness of breath and vaginal bleeding VS:  Vitals:   12/28/19 1515 12/28/19 1530 12/28/19 1545 12/28/19 1629  BP: (!) 130/97 (!) 143/128 (!) 92/57 96/64  Pulse: 87 93 89 91  Resp: 15 15 20  (!) 21  Temp:    99.5 F (37.5 C)  TempSrc:    Oral  SpO2: 100% 100% 100% 100%  Weight:      Height:        HE:RDEYCXK is gathered by patient and EMR. Previous records obtained and reviewed. DDX:The patient's complaint of sob involves an extensive number of diagnostic and treatment options, and is a complaint that carries with it a high risk of  complications, morbidity, and potential mortality. Given the large differential diagnosis, medical decision making is of high complexity. The emergent differential diagnosis for shortness of breath includes, but is not limited to, Pulmonary edema, bronchoconstriction, Pneumonia, Pulmonary embolism, Pneumotherax/ Hemothorax, Dysrythmia, ACS.   Labs: I ordered reviewed and interpreted labs which included CBC without significant abnormality, normal hemoglobin BMP shows baseline renal insufficiency, mild hypokalemia Urinalysis shows large amount of hemoglobin without evidence of infection Troponin negative x2 I-STAT hCG negative Wet prep shows rare white cells no other abnormalities. GC chlamydia pending.  Imaging: I ordered and reviewed images which included 2 view chest x-ray. I independently visualized and interpreted all imaging.There are no acute, significant findings on today's images however imaging is limited due to body habitus. EKG: Consults: Case discussed with IM residents Dr. Marva Panda and Dr. Collene Gobble. In shared decision-making we will give the patient a dose of her Lasix and have her follow-up tomorrow in the office with Dr. Charleen Kirks. Patient feels comfortable with this plan.  MDM: Patient here with chronic recurrent episodes of vaginal bleeding. She is on her baseline oxygen supplementation without increased oxygen need. No obvious evidence of volume overload however her exam is significantly limited due to her body habitus. Patient given oral potassium and magnesium here. Given IV Lasix, discharged with close outpatient follow-up. Appears appropriate for discharge at this time. Patient disposition:The patient appears reasonably screened and/or stabilized for discharge and I doubt any other medical condition or other [facility] requiring further screening, evaluation, or treatment in the ED at this time prior to discharge. I have discussed lab and/or imaging findings with the patient and answered all  questions/concerns to the best of my ability.I have discussed return precautions and OP follow up.    Final Clinical Impression(s) / ED Diagnoses Final diagnoses:  Shortness of breath  Chronic respiratory failure, unspecified whether with hypoxia or hypercapnia (HCC)  Abnormal uterine bleeding (AUB)  Hypokalemia    Rx / DC Orders ED Discharge Orders    None       Margarita Mail, PA-C 12/28/19 1835    Dorie Rank, MD  12/30/19 0750  

## 2019-12-28 NOTE — ED Notes (Signed)
Pt discharged via wheelchair. All questions and concerns addressed. No complaints at this time.

## 2019-12-28 NOTE — ED Triage Notes (Signed)
Patient arrives to ED with complaints of chest pain described as sharp starting at 330 am today. Pt states mildly worsening SOB with c/o of dyspnea at rest. Pt also has c/o of vaginal bleeding since 11/3 that has continued and became heavier.

## 2019-12-29 ENCOUNTER — Encounter: Payer: Self-pay | Admitting: Student

## 2019-12-29 ENCOUNTER — Telehealth: Payer: Self-pay

## 2019-12-29 ENCOUNTER — Ambulatory Visit (INDEPENDENT_AMBULATORY_CARE_PROVIDER_SITE_OTHER): Payer: Medicare Other | Admitting: Student

## 2019-12-29 DIAGNOSIS — N921 Excessive and frequent menstruation with irregular cycle: Secondary | ICD-10-CM

## 2019-12-29 DIAGNOSIS — I5023 Acute on chronic systolic (congestive) heart failure: Secondary | ICD-10-CM | POA: Diagnosis not present

## 2019-12-29 LAB — GC/CHLAMYDIA PROBE AMP (~~LOC~~) NOT AT ARMC
Chlamydia: NEGATIVE
Comment: NEGATIVE
Comment: NORMAL
Neisseria Gonorrhea: NEGATIVE

## 2019-12-29 NOTE — Progress Notes (Signed)
  [facility] Internal Medicine Residency Telephone Encounter Continuity Care Appointment  HPI:   This telephone encounter was created for [name] on 12/29/2019 for the following purpose/cc ED follow up for SOB and menorrhagia.   Past Medical History:  Past Medical History:  Diagnosis Date  . Acid reflux   . Anemia    Iron Def  . Anorexia   . CHF (congestive heart failure) ([place])   . Chronic kidney disease    Nephrotic syndrome  . Colon polyp 2009  . Depression with anxiety   . Edema leg   . Fibromyalgia   . Hemorrhoids   . Hidradenitis suppurativa   . Hypertension   . IBS (irritable bowel syndrome)   . Low back pain   . Migraines   . Morbidly obese ([place])   . Neuromuscular disorder (HCC)    fibromyalgia  . Neuropathy   . Panic attacks   . Polyp of vocal cord or larynx   . Tonsil pain       ROS:  Review of Systems  Respiratory: Positive for sputum production and shortness of breath. Negative for cough.   Cardiovascular: Positive for leg swelling. Negative for chest pain and orthopnea.  Musculoskeletal: Positive for joint pain.     Assessment / Plan / Recommendations:   Please see A&P under problem oriented charting for assessment of the patient's acute and chronic medical conditions.   As always, pt is advised that if symptoms worsen or new symptoms arise, they should go to an urgent care facility or to to ER for further evaluation.   Consent and Medical Decision Making:   Patient seen with Dr. Dareen Piano  This is a telephone encounter between Lindsay [name] and Gaylan Gerold on 12/29/2019 for ED follow-up for shortness of breath and menorrhagia. The visit was conducted with the patient located at home and Gaylan Gerold at [facility]. The patient's identity was confirmed using their DOB and current address. The patient has consented to being evaluated through a telephone encounter and understands the associated risks (an examination cannot be done and the patient may  need to come in for an appointment) / benefits (allows the patient to remain at home, decreasing exposure to coronavirus). I personally spent 19 minutes on medical discussion.

## 2019-12-29 NOTE — Telephone Encounter (Signed)
Received TC from patient who states she was seen in the ED yesterday and was to have a ED follow up this afternoon, but she can't come to the office.  Patient c/o of ongoing h/a since Sunday and states her legs are hurting and she can't come in for an in person visit. Patient states her vaginal bleeding has lessened and states she is changing pads every 4-5 hours, denies any passing of clots.   RN stressed to patient MD needs to see her in person and she is adamant that she won't come in, but does agree to a telehealth visit.  Appt changed to telehealth.  RN stressed to patient if she develops and SOB, chest pain, visual disturbances, slurred speech to call 911.  She verbalized understanding and denies all above symptoms at present time. SChaplin, RN,BSN

## 2019-12-29 NOTE — Assessment & Plan Note (Signed)
Patient was in the ED for heavy vaginal bleeding.  Her hemoglobin however stable at 13.  She was seen by Dr. Kennon Rounds in the past and had an endometrial biopsy in 2016 which was negative for any malignancy and TVUS negative for fibroids.  Patient states that her bleeding has lessened today.  She inquires about Megace that she still has leftover.  She also have a Nexplanon implanted in October 21 of 2020.  Plan: Patient is emphasized to make a follow-up appointment with her GYN physician.  She can also call the GYN office to inquire about Megace.

## 2019-12-29 NOTE — Assessment & Plan Note (Addendum)
12/28/2019: Patient presented to the ED for shortness of breath and heavy vaginal bleeding.  Of note, her weight has increased by 8 pounds since last week.  BNP 38, troponin flat, creatinine 1.26, hemoglobin 13 and potassium 33.  She was given 1 dose of 40 mg IV Lasix and was discharged home and follow-up with the [facility] in person the next day.  12/29/2019: Unfortunately patient could not present to the clinic and had a telehealth visit for her follow-up.  She states that her shortness of breath has improved.  Her dyspnea is worse with exertion, better with rest.  She is on 4 L oxygen at home.  She denies orthopnea and could not tell if she has LE edema due to her body habitus.  Her daughter thinks that patient has swollen knee, left greater than right, she endorses left knee pain more than 2 years and denies fever.  She has a follow-up appointment with cardiology on December 6.  Patient has quit smoking in January 2021.  She denies any cough but has had more sputum production 2 days ago.  She uses her butyryl inhaler 1-2 times a day, nebulizer every 6 hour and Symbicort twice daily.  Patient also has sleep apnea but is not using a CPAP machine  Plan: Patient is advised to be seen in the clinic to assess her volume status.  She agreed with coming to the clinic on Friday morning 12/31/2019.  In the meantime, given her recent weight gain and shortness of breath, we will increase her torsemide to 40 mg twice daily.  Patient will be seen in the clinic to assess her volume status and adjust her medication accordingly.  We will also check BMP to evaluate for kidney function and potassium.  Patient is also emphasized to follow-up with cardiology on December 6.  Patient also inquired about physical therapy approval.  It seems that it was ordered by the orthopedic office.  Patient will need to call the office to get a status on this referral.

## 2019-12-30 ENCOUNTER — Encounter: Payer: Medicare Other | Admitting: Internal Medicine

## 2019-12-31 ENCOUNTER — Encounter: Payer: Self-pay | Admitting: Student

## 2019-12-31 ENCOUNTER — Ambulatory Visit (INDEPENDENT_AMBULATORY_CARE_PROVIDER_SITE_OTHER): Payer: Medicare Other | Admitting: Licensed Clinical Social Worker

## 2019-12-31 ENCOUNTER — Ambulatory Visit (INDEPENDENT_AMBULATORY_CARE_PROVIDER_SITE_OTHER): Payer: Medicare Other | Admitting: Student

## 2019-12-31 ENCOUNTER — Telehealth: Payer: Self-pay | Admitting: Family Medicine

## 2019-12-31 VITALS — BP 130/77 | HR 91 | Temp 98.6°F | Ht 71.0 in | Wt >= 6400 oz

## 2019-12-31 DIAGNOSIS — F411 Generalized anxiety disorder: Secondary | ICD-10-CM | POA: Diagnosis not present

## 2019-12-31 DIAGNOSIS — F4312 Post-traumatic stress disorder, chronic: Secondary | ICD-10-CM | POA: Diagnosis not present

## 2019-12-31 DIAGNOSIS — I5023 Acute on chronic systolic (congestive) heart failure: Secondary | ICD-10-CM

## 2019-12-31 DIAGNOSIS — M79605 Pain in left leg: Secondary | ICD-10-CM | POA: Diagnosis not present

## 2019-12-31 DIAGNOSIS — M25561 Pain in right knee: Secondary | ICD-10-CM | POA: Diagnosis not present

## 2019-12-31 DIAGNOSIS — G8929 Other chronic pain: Secondary | ICD-10-CM | POA: Diagnosis not present

## 2019-12-31 DIAGNOSIS — N921 Excessive and frequent menstruation with irregular cycle: Secondary | ICD-10-CM

## 2019-12-31 DIAGNOSIS — M25562 Pain in left knee: Secondary | ICD-10-CM

## 2019-12-31 DIAGNOSIS — F331 Major depressive disorder, recurrent, moderate: Secondary | ICD-10-CM

## 2019-12-31 DIAGNOSIS — F410 Panic disorder [episodic paroxysmal anxiety] without agoraphobia: Secondary | ICD-10-CM

## 2019-12-31 MED ORDER — DICLOFENAC SODIUM 1 % EX GEL: 2.0000 g | Freq: Two times a day (BID) | CUTANEOUS | 2 refills | Status: DC | PRN

## 2019-12-31 MED ORDER — MEGESTROL ACETATE 40 MG PO TABS: 80.0000 mg | ORAL_TABLET | Freq: Every day | ORAL | 1 refills | Status: DC

## 2019-12-31 MED ORDER — TORSEMIDE 20 MG PO TABS: 40.0000 mg | ORAL_TABLET | Freq: Two times a day (BID) | ORAL | 2 refills | Status: DC

## 2019-12-31 NOTE — Telephone Encounter (Signed)
I called Bridey and she reports she wants to find out if it is ok to take megace now that she has a new diagnosis of Heart failure. States she has a few left from before and needs a refill if it is ok to take. States she went to ED recently for chest pain and bleeding and they told her to follow up with her provider. States she did take the megace in August as prescribed and bleeding stopped within 5 days of starting it- states no bleeding until November 3rd. States since then bled everyday sometimes normal ;sometimes heavy. States passes normal clots and then sometimes passes big, meaty clots like hand sized.  We discussed she has missed some appointments with our provider - she states she was in the hospital. I informed her I would need to talk with provider and get back to her either today or early next week as Dr. Kennon Rounds not here today. I also instructed her if bleeding heavy enough to saturate pad in one hour or less for several hours to go to hospital for evaluation. She voices understanding.  Diontre Harps,RN I reviewed patient complaints and history with Dr. Harolyn Rutherford . Orders given for Megace 80 mg daily. May be increased to 80 mg BID if this does not control her bleeding.  I called [facility] and explained we are sending in Megace 80 mg daily and that is safe with DX Heart failure.  I also instructed her to call us back if that doesn't control her bleeding and we will change the dose. I also instructed her to keep appointment as scheduled and call and change if needed. She voices understanding.  Sulma Ruffino,RN

## 2019-12-31 NOTE — Progress Notes (Signed)
Virtual Visit via Telephone Note  I connected with [name] on 12/31/19 at  8:00 AM EST by telephone and verified that I am speaking with the correct person using two identifiers.  Location: Patient: home Provider: home office   I discussed the limitations, risks, security and privacy concerns of performing an evaluation and management service by telephone and the availability of in person appointments. I also discussed with the patient that there may be a patient responsible charge related to this service. The patient expressed understanding and agreed to proceed.   I discussed the assessment and treatment plan with the patient. The patient was provided an opportunity to ask questions and all were answered. The patient agreed with the plan and demonstrated an understanding of the instructions.   The patient was advised to call back or seek an in-person evaluation if the symptoms worsen or if the condition fails to improve as anticipated.  I provided 50 minutes of non-face-to-face time during this encounter.   THERAPIST PROGRESS NOTE  Session Time: 8:00 AM to 8:50 AM  Participation Level: Active  Behavioral Response: CasualAlertAnxious and Dysphoric  Type of Therapy: Individual Therapy  Treatment Goals addressed:  help cope with health issues, anxiety, triggers, coping  Interventions: Solution Focused, Strength-based, Supportive and Other: coping  Summary: Porshea Janowski is a 49 y.o. female who presents with can't walk on legs (knee pressure, appointment to go to sports orthopedics and has to see Dr. Lanae Crumbly because more than 400 lbs, but in ER on Monday. Has been having vaginal bleeding since November 3. After last hospitalization was supposed to see OBGyn but patient shares they didn't have an appointment until January. Shares that GYN prescribed her something to stop bleeding and will check with PCP to see if can take with heart issues.(Going to appointment right after this  appointment-a follow up from ER) Nurse from insurance who followed her after ER said to ask for Transvaginal ultra sound probe. Also want to take blood samples because heart weaker than 1 month ago. Need a full physical so going to the office. Dr. Ezra Sites prescribed her medication to stop bleeding and said never let it go that far, patient bleeding so why went to ER, patient describes her bleeding in terms of handful of bleeding in one clot.  Discussed seriousness of not being able to walk and patient relates when she brings it up they tell her that they were going to focus on a heart.  Makes her mad. Mental health helps her to keep sane. Therapist discussed with patient ways to share with doctors what is going wrong so they realize it is an urgent need that needs to be addressed.  Discussed therapist perspective of her role of encouraging patient to get to doctors as medical needs are priority and urgent so it is good that patient is going to her doctor after this appointment and also that nurse from insurance has been following her to further see if needs getting addressed.  Therapist discussed her role to make sure patient is an advocate for herself and assessed patient is doing this for herself the frustrated with the system and not feeling getting the help she needs when she needs it.  Validated patient on her frustrations.  Utilize interventions to help with anxiety for example therapist shared will help when she gets the pill to slow down bleeding or help ease anxiety around this.  Therapist also pointed out there is a plan this is what needs to happen to  address medical issues and therapist sees that with patient going to plan to see orthopedic, getting medicine to slow down breathing.  Discussed needing to talk about having 1 Dr. Follow her who is an experienced doctor.  Therapist provided active listening open questions supportive interventions.  Suicidal/Homicidal: No  Plan: Return again in 3  weeks.2.Therapist work with patient on depression, anxiety, processing through feelings related to past, managing health is  Diagnosis: Axis I:  Major depressive disorder, recurrent, moderate, generalized anxiety disorder, chronic PTSD, panic attacks    Axis II: No diagnosis    [name], LCSW 12/31/2019

## 2019-12-31 NOTE — Telephone Encounter (Signed)
patient want to discuss medication that can help with her bleeding

## 2019-12-31 NOTE — Patient Instructions (Signed)
Lindsay [name],  It is a pleasure seeing you in the clinic today.  Here is a summary of what we talked about:  1.  Shortness of breath: Please continue the torsemide 40 mg twice daily.  I have sent a new prescription to the pharmacy.  Please follow-up with your cardiology on December 6.  Please come back to the clinic next Thursday for follow-up with me.  2.  Left knee pain: Please follow-up with the orthopedic for any interventions.  I also sent a referral for physical therapy.  Take care  Dr. Alfonse Spruce

## 2019-12-31 NOTE — Assessment & Plan Note (Addendum)
12/28/2019: Patient presented to the ED for shortness of breath and heavy vaginal bleeding.  Of note, her weight has increased by 8 pounds since last week.  BNP 38, troponin flat, creatinine 1.26, hemoglobin 13 and potassium 33.  She was given 1 dose of 40 mg IV Lasix and was discharged home and follow-up with the [facility] in person the next day.  12/29/2019: Unfortunately patient could not present to the clinic and had a telehealth visit for her follow-up.  Patient was originally on torsemide 40 twice daily but was decreased to daily given worsening kidney function.  Torsemide was increased to 40 mg twice daily given 8 pound weight gains.  12/31/2019: Patient states that her shortness of breath is improving and she is not on oxygen today.  She has started taking the torsemide 40 mg twice daily since December 1 and reports mildly increased of urine output.  Lung sounds were clear on physical exam with only trace edema of bilateral lower extremities.  Could not assess for JVD given body habitus.  Her weights increased from 402 pounds in 11/30 to 407 pounds today.  Due to this weight gain, will continue torsemide 40 mg twice daily.  Also check BMP today and have patient follow-up with the clinic next week for reevaluation.  Plan: -Torsemide 40 mg twice daily -Coreg 3.125 twice daily -BMP today -Follow-up with [facility] next Thursday -Follow-up with cardiology on December 6

## 2019-12-31 NOTE — Progress Notes (Signed)
   CC: Shortness of breath and Left knee pain  HPI:  [name] is a 49 y.o. with PMH of HFrEF, morbid obesity, fibromyalgia, OSA who presents to clinic for follow-up of after ED visit for shortness of breath.  Patient also complains of chronic left knee pain.  Please see problem based charting for further detail.  Past Medical History:  Diagnosis Date  . Acid reflux   . Anemia    Iron Def  . Anorexia   . CHF (congestive heart failure) ([place])   . Chronic kidney disease    Nephrotic syndrome  . Colon polyp 2009  . Depression with anxiety   . Edema leg   . Fibromyalgia   . Hemorrhoids   . Hidradenitis suppurativa   . Hypertension   . IBS (irritable bowel syndrome)   . Low back pain   . Migraines   . Morbidly obese ([name])   . Neuromuscular disorder (HCC)    fibromyalgia  . Neuropathy   . Panic attacks   . Polyp of vocal cord or larynx   . Tonsil pain    Review of Systems: As per HPI  Physical Exam:  Vitals:   12/31/19 0930  BP: 130/77  Pulse: 91  Temp: 98.6 F (37 C)  TempSrc: Oral  SpO2: 96%  Weight: (!) 407 lb 11.2 oz (184.9 kg)  Height: 5\' 11"  (1.803 m)   Physical Exam Constitutional:      Appearance: She is obese.  HENT:     Head: Normocephalic.  Eyes:     General:        Right eye: No discharge.        Left eye: No discharge.  Cardiovascular:     Rate and Rhythm: Normal rate and regular rhythm.  Pulmonary:     Effort: No respiratory distress.     Breath sounds: Normal breath sounds.  Abdominal:     General: Bowel sounds are normal.  Musculoskeletal:     Right lower leg: Edema (Trace) present.     Left lower leg: Edema (Trace) present.     Comments: Pain at left knee with knee flexion and extension.  Not warm to touch, no erythema or joint effusion palpated.  Neurological:     Mental Status: She is alert.     Assessment & Plan:   See Encounters Tab for problem based charting.  Patient seen with Dr. Dareen Piano

## 2019-12-31 NOTE — Assessment & Plan Note (Signed)
Patient complains of left knee pain that has been going on for more than 1 year.  Denies fever.  States that her knee pain limited her ability in the house.  She does have a rolling walker but could not use in the house due to the narrow hallway.  States that her ambulation is very limited and she only stay in her room or go to the bathroom.  Patient has tried Tylenol, lidocaine patch, Voltaren gel and knee injection without any significant relief.  Dates that she has tried physical therapy in the past and they helped her get out of the house and use a rolling walker.  She would like to continue with physical therapy.  X-ray of her left knee is negative for any fracture.  Patient states that Dr. Heber [place] has done an ultrasound of her left knee.  Physical exam is negative for erythema, warmth or palpated joint effusion.  Patient has pain with knee extension and flexion.  Given the chronicity of the knee pain and history, this is likely osteoarthritis.  Plan is to have patient follow-up with an orthopedic for any surgical interventions.  Patient might likely benefit from bariatric surgery for weight loss.  Will follow up with orthopedic recommendations.  Plan: -Continue Tylenol and Voltaren gel -Referral to physical therapy -Patient is in the process of getting into a pain management clinic -Follow-up with orthopedic

## 2020-01-01 LAB — BMP8+ANION GAP
Anion Gap: 16 mmol/L (ref 10.0–18.0)
BUN/Creatinine Ratio: 13 (ref 9–23)
BUN: 12 mg/dL (ref 6–24)
CO2: 27 mmol/L (ref 20–29)
Calcium: 9.1 mg/dL (ref 8.7–10.2)
Chloride: 99 mmol/L (ref 96–106)
Creatinine, Ser: 0.95 mg/dL (ref 0.57–1.00)
GFR calc Af Amer: 81 mL/min/{1.73_m2} (ref >59)
GFR calc non Af Amer: 70 mL/min/{1.73_m2} (ref >59)
Glucose: 112 mg/dL — ABNORMAL HIGH (ref 65–99)
Potassium: 3.8 mmol/L (ref 3.5–5.2)
Sodium: 142 mmol/L (ref 134–144)

## 2020-01-03 DIAGNOSIS — G4733 Obstructive sleep apnea (adult) (pediatric): Secondary | ICD-10-CM | POA: Diagnosis not present

## 2020-01-03 DIAGNOSIS — J9610 Chronic respiratory failure, unspecified whether with hypoxia or hypercapnia: Secondary | ICD-10-CM | POA: Diagnosis not present

## 2020-01-03 DIAGNOSIS — J449 Chronic obstructive pulmonary disease, unspecified: Secondary | ICD-10-CM | POA: Diagnosis not present

## 2020-01-03 DIAGNOSIS — I5040 Unspecified combined systolic (congestive) and diastolic (congestive) heart failure: Secondary | ICD-10-CM | POA: Diagnosis not present

## 2020-01-03 DIAGNOSIS — I10 Essential (primary) hypertension: Secondary | ICD-10-CM | POA: Diagnosis not present

## 2020-01-03 NOTE — Progress Notes (Signed)
Internal Medicine Clinic Attending  I evaluated the patient.  I personally confirmed the key portions of the history and exam documented by Dr. Alfonse Spruce and I reviewed pertinent patient test results.  The assessment, diagnosis, and plan were formulated together and I agree with the documentation in the resident's note.

## 2020-01-03 NOTE — Progress Notes (Signed)
Internal Medicine Clinic Attending  I saw and evaluated the patient.  I personally confirmed the key portions of the history and exam documented by Dr. Nguyen and I reviewed pertinent patient test results.  The assessment, diagnosis, and plan were formulated together and I agree with the documentation in the resident's note.\  

## 2020-01-03 NOTE — Addendum Note (Signed)
Addended by: Aldine Contes on: 01/03/2020 10:08 AM   Modules accepted: Level of Service

## 2020-01-06 ENCOUNTER — Other Ambulatory Visit: Payer: Self-pay | Admitting: Student

## 2020-01-06 ENCOUNTER — Other Ambulatory Visit: Payer: Self-pay

## 2020-01-06 ENCOUNTER — Encounter: Payer: Medicare Other | Admitting: Student

## 2020-01-06 ENCOUNTER — Telehealth: Payer: Self-pay | Admitting: *Deleted

## 2020-01-06 DIAGNOSIS — I5023 Acute on chronic systolic (congestive) heart failure: Secondary | ICD-10-CM

## 2020-01-06 DIAGNOSIS — J9620 Acute and chronic respiratory failure, unspecified whether with hypoxia or hypercapnia: Secondary | ICD-10-CM | POA: Diagnosis not present

## 2020-01-06 MED ORDER — TORSEMIDE 20 MG PO TABS: 20.0000 mg | ORAL_TABLET | Freq: Two times a day (BID) | ORAL | 2 refills | Status: DC

## 2020-01-06 NOTE — Progress Notes (Addendum)
I called and spoke to patient this morning.  Our plan was to have an in person visit today so that we can check her BMP and reassess her volume status.  Patient states that she could not come due to her chronic pain.  She has seen Dr. Terrence Dupont on January 03, 2020 and was started on metoprolol and Entresto. Her BMP on 12/20/121 shows normal creatinine of 0.95 and potassium of 3.8.  Will fax this lab result to Dr. Leida Lauth office.  Patient is advised to come back next week for repeat BMP.  Patient agrees with the plan and say that she will try to make it to the office.  Addendum:  Per Dr. Zenia Resides note, she was started on Metoprolol succinate, Entresto, and reduced Torsemide 20 mg BID. Faxed her BMP results to Dr. Terrence Dupont office.

## 2020-01-06 NOTE — Addendum Note (Signed)
Addended byGaylan Gerold on: 01/06/2020 04:49 PM   Modules accepted: Orders

## 2020-01-06 NOTE — Telephone Encounter (Signed)
BMP ( completed 12/31/19) faxed to Dr Franki Cabot office [phone]) per DR Nguyen's request.

## 2020-01-07 ENCOUNTER — Other Ambulatory Visit: Payer: Self-pay | Admitting: Internal Medicine

## 2020-01-07 DIAGNOSIS — M797 Fibromyalgia: Secondary | ICD-10-CM

## 2020-01-08 ENCOUNTER — Other Ambulatory Visit: Payer: Self-pay | Admitting: Internal Medicine

## 2020-01-09 DIAGNOSIS — G4733 Obstructive sleep apnea (adult) (pediatric): Secondary | ICD-10-CM | POA: Diagnosis not present

## 2020-01-10 ENCOUNTER — Other Ambulatory Visit: Payer: Self-pay

## 2020-01-10 ENCOUNTER — Telehealth: Payer: Self-pay | Admitting: *Deleted

## 2020-01-10 ENCOUNTER — Encounter: Payer: Self-pay | Admitting: Internal Medicine

## 2020-01-10 ENCOUNTER — Other Ambulatory Visit (INDEPENDENT_AMBULATORY_CARE_PROVIDER_SITE_OTHER): Payer: Medicare Other | Admitting: Internal Medicine

## 2020-01-10 DIAGNOSIS — R600 Localized edema: Secondary | ICD-10-CM

## 2020-01-10 DIAGNOSIS — I5023 Acute on chronic systolic (congestive) heart failure: Secondary | ICD-10-CM | POA: Diagnosis not present

## 2020-01-10 DIAGNOSIS — G8929 Other chronic pain: Secondary | ICD-10-CM

## 2020-01-10 DIAGNOSIS — M79605 Pain in left leg: Secondary | ICD-10-CM | POA: Diagnosis not present

## 2020-01-10 NOTE — Patient Instructions (Addendum)
Lindsay [name],  It was a pleasure seeing you again! Today we discussed your lower extremity swelling. Please increase your Torsemide to 40 mg (2 pills) in the morning and in the evening. We will make sure your blood work gets back to your cardiologist. Additionally, please follow up with your orthopedist on January 13, 2020. We will follow up with your lower leg swelling next week. Have a good day!  Sincerely,  Maudie Mercury, mD

## 2020-01-10 NOTE — Telephone Encounter (Signed)
WALK IN PT  Presents for labs, she would like to be seen for edema of legs causing labored breathing, she does not appear to be in distress, denies distress, 02 sats per PT good. Spoke to dr Daryll Drown will add in slot at [place] dr Darrick Meigs

## 2020-01-10 NOTE — Progress Notes (Signed)
   CC: Lower Extremity Swelling, Bilaterally, Arthritis  HPI:  [name] is a 49 y.o. female, with a PMH noted below, who presents to the clinic for follow up on her lower extremity edema. To see the management of her acute and chronic conditions, please see the A&P note under the Encounters tab.   Past Medical History:  Diagnosis Date  . Acid reflux   . Anemia    Iron Def  . Anorexia   . CHF (congestive heart failure) ([place])   . Chronic kidney disease    Nephrotic syndrome  . Colon polyp 2009  . Depression with anxiety   . Edema leg   . Fibromyalgia   . Hemorrhoids   . Hidradenitis suppurativa   . Hypertension   . IBS (irritable bowel syndrome)   . Low back pain   . Migraines   . Morbidly obese ([place])   . Neuromuscular disorder (HCC)    fibromyalgia  . Neuropathy   . Panic attacks   . Polyp of vocal cord or larynx   . Tonsil pain    Review of Systems:   Review of Systems  Constitutional: Positive for malaise/fatigue. Negative for chills, diaphoresis and fever.  Respiratory: Positive for shortness of breath. Negative for cough and sputum production.   Cardiovascular: Positive for leg swelling. Negative for chest pain, palpitations and orthopnea.  Gastrointestinal: Negative for abdominal pain, constipation, diarrhea, nausea and vomiting.    Physical Exam:  Vitals:   01/10/20 1031  BP: 120/79  Pulse: 88  Temp: 98.3 F (36.8 C)  TempSrc: Oral  SpO2: 99%  Weight: (!) 405 lb 14.4 oz (184.1 kg)  Height: 5\' 11"  (1.803 m)   Physical Exam Constitutional:      Appearance: She is well-developed.  Cardiovascular:     Rate and Rhythm: Normal rate and regular rhythm.  Pulmonary:     Effort: Pulmonary effort is normal. No tachypnea or accessory muscle usage.     Breath sounds: Normal breath sounds.  Musculoskeletal:     Right lower leg: No tenderness. Edema present.     Left lower leg: No tenderness. Edema present.     Comments: 1+ Pitting edema to the knees  bilaterally in the lower extremities.   Neurological:     Mental Status: She is alert.    Assessment & Plan:   See Encounters Tab for problem based charting.  Patient discussed with Dr. Dareen Piano

## 2020-01-10 NOTE — Assessment & Plan Note (Signed)
Patient presents to the clinic with continued lower extremity edema. Patient states that she did see her cardiologist last week who decreased her torsemide to 20 mg twice daily from 40 mg twice daily and performed a EKG, which showed mild tachycardia. Additionally he started the patient on Entresto  Patient states that she feels her legs are swelling again, and that she feels as though she cannot get a full breath, but has had no increase in her oxygen demand. Her cardiac exam is unrevealing as is her pulmonary examination. She does have 1+ pitting edema bilaterally. Given that she is 405 lbs from her last visit with [name] at 407 pounds, we will increase her torsemide again to 40 twice daily, with FU appointment next week with BMP recheck.  - increase torsemide to 40 mg twice daily - reevaluate in 1 weeks time - repeat BMP at next visit - Continue Entresto 24-26 mg twice daily - Continue Metoprolol 50 mg daily.

## 2020-01-10 NOTE — Addendum Note (Signed)
Addended by: Maudie Mercury C on: 01/10/2020 12:54 PM   Modules accepted: Level of Service

## 2020-01-11 ENCOUNTER — Telehealth: Payer: Self-pay | Admitting: *Deleted

## 2020-01-11 ENCOUNTER — Other Ambulatory Visit: Payer: Self-pay | Admitting: Internal Medicine

## 2020-01-11 ENCOUNTER — Other Ambulatory Visit: Payer: Self-pay | Admitting: Student

## 2020-01-11 ENCOUNTER — Encounter: Payer: Self-pay | Admitting: *Deleted

## 2020-01-11 DIAGNOSIS — L732 Hidradenitis suppurativa: Secondary | ICD-10-CM

## 2020-01-11 LAB — BMP8+ANION GAP
Anion Gap: 18 mmol/L (ref 10.0–18.0)
BUN/Creatinine Ratio: 10 (ref 9–23)
BUN: 13 mg/dL (ref 6–24)
CO2: 25 mmol/L (ref 20–29)
Calcium: 9.1 mg/dL (ref 8.7–10.2)
Chloride: 97 mmol/L (ref 96–106)
Creatinine, Ser: 1.24 mg/dL — ABNORMAL HIGH (ref 0.57–1.00)
GFR calc Af Amer: 59 mL/min/{1.73_m2} — ABNORMAL LOW (ref >59)
GFR calc non Af Amer: 51 mL/min/{1.73_m2} — ABNORMAL LOW (ref >59)
Glucose: 135 mg/dL — ABNORMAL HIGH (ref 65–99)
Potassium: 3.6 mmol/L (ref 3.5–5.2)
Sodium: 140 mmol/L (ref 134–144)

## 2020-01-11 MED ORDER — DICLOFENAC SODIUM 3 % EX GEL: 2.0000 g | Freq: Two times a day (BID) | CUTANEOUS | 0 refills | Status: DC | PRN

## 2020-01-11 NOTE — Telephone Encounter (Signed)
Patient has two upcoming appointments scheduled for 01/18/2020 at 8:45 am with Dr. Gilford Rile and 02/02/2020 at 8:45 am with Dr. Philipp Ovens.

## 2020-01-11 NOTE — Addendum Note (Signed)
Addended by: Maudie Mercury C on: 01/11/2020 02:50 PM   Modules accepted: Orders

## 2020-01-11 NOTE — Telephone Encounter (Signed)
Please have patient schedule follow up for her hidradenitis. I have approved her refill request for clindamycin gel, but this has been refilled multiple times in the past and needs to be seen for further refills. Thanks.

## 2020-01-11 NOTE — Progress Notes (Signed)
Internal Medicine Clinic Attending ? ?Case discussed with Dr. Winters  At the time of the visit.  We reviewed the resident?s history and exam and pertinent patient test results.  I agree with the assessment, diagnosis, and plan of care documented in the resident?s note.  ?

## 2020-01-11 NOTE — Telephone Encounter (Signed)
Faxed 01-10-2020 BMP results, [name] to Dr Jerilynn Mages. Harwani at [phone] 01-11-2020 13:58  Maryan Rued, PBT 01-11-2020 14:00

## 2020-01-11 NOTE — Telephone Encounter (Signed)
Needs PA on diclofenac 3%, needs 3% not 1% per alan at [facility]

## 2020-01-12 NOTE — Telephone Encounter (Signed)
Has been denied previously. Recommendation was OTC medicaton.

## 2020-01-13 DIAGNOSIS — M25562 Pain in left knee: Secondary | ICD-10-CM | POA: Diagnosis not present

## 2020-01-13 DIAGNOSIS — M25561 Pain in right knee: Secondary | ICD-10-CM | POA: Diagnosis not present

## 2020-01-13 DIAGNOSIS — M47816 Spondylosis without myelopathy or radiculopathy, lumbar region: Secondary | ICD-10-CM | POA: Diagnosis not present

## 2020-01-13 NOTE — Telephone Encounter (Signed)
Next Appt With Internal Medicine Maudie Mercury, MD) 01/18/2020 at 8:45 AM  Coreg no longer on medication list, please advise.Despina Hidden Cassady12/16/202110:12 AM

## 2020-01-18 ENCOUNTER — Ambulatory Visit (HOSPITAL_COMMUNITY): Payer: Medicare Other | Admitting: Licensed Clinical Social Worker

## 2020-01-18 ENCOUNTER — Encounter: Payer: Medicare Other | Admitting: Internal Medicine

## 2020-01-18 ENCOUNTER — Ambulatory Visit (INDEPENDENT_AMBULATORY_CARE_PROVIDER_SITE_OTHER): Payer: Medicare Other | Admitting: Internal Medicine

## 2020-01-18 DIAGNOSIS — I5023 Acute on chronic systolic (congestive) heart failure: Secondary | ICD-10-CM

## 2020-01-18 DIAGNOSIS — S22000A Wedge compression fracture of unspecified thoracic vertebra, initial encounter for closed fracture: Secondary | ICD-10-CM | POA: Diagnosis not present

## 2020-01-18 NOTE — Assessment & Plan Note (Signed)
Patient presented for telehealth visit today. Her transport did not come to pick her up. She endorses that her [facility] has resolved and she is back on her home dose of 20 mg Demadex.  - Continue Demadex - BMP at next office visit

## 2020-01-18 NOTE — Progress Notes (Signed)
  [facility] Internal Medicine Residency Telephone Encounter Continuity Care Appointment  HPI:   This telephone encounter was created for [name] on 01/18/2020 for the following purpose/cc SHOB and Back Pain.   Past Medical History:  Past Medical History:  Diagnosis Date  . Acid reflux   . Anemia    Iron Def  . Anorexia   . CHF (congestive heart failure) ([place])   . Chronic kidney disease    Nephrotic syndrome  . Colon polyp 2009  . Depression with anxiety   . Edema leg   . Fibromyalgia   . Hemorrhoids   . Hidradenitis suppurativa   . Hypertension   . IBS (irritable bowel syndrome)   . Low back pain   . Migraines   . Morbidly obese ([place])   . Neuromuscular disorder (HCC)    fibromyalgia  . Neuropathy   . Panic attacks   . Polyp of vocal cord or larynx   . Tonsil pain       ROS:  Review of Systems  Constitutional: Negative for chills, malaise/fatigue and weight loss.  Gastrointestinal: Negative for abdominal pain, constipation, diarrhea, nausea and vomiting.  Musculoskeletal: Positive for back pain and joint pain.  Neurological: Negative for dizziness, tingling and headaches.     Assessment / Plan / Recommendations:   Please see A&P under problem oriented charting for assessment of the patient's acute and chronic medical conditions.   As always, pt is advised that if symptoms worsen or new symptoms arise, they should go to an urgent care facility or to to ER for further evaluation.   Consent and Medical Decision Making:   Patient discussed with Dr. Angelia Mould  This is a telephone encounter between Lindsay [name] and Maudie Mercury on 01/18/2020 for [facility] and back pain. The visit was conducted with the patient located at home and Maudie Mercury at [facility]. The patient's identity was confirmed using their DOB and current address. The patient has consented to being evaluated through a telephone encounter and understands the associated risks (an examination cannot  be done and the patient may need to come in for an appointment) / benefits (allows the patient to remain at home, decreasing exposure to coronavirus). I personally spent 13 minutes on medical discussion.

## 2020-01-18 NOTE — Assessment & Plan Note (Signed)
Patient with chronic back pain and compression fracture of T3, presents for chronic pain. Patient has been on multiple medications for her pain and all have not given her relief. She was seen by sports medicine, who instructed her to lose weight and referred her to pain management an rehabilitation. Patient states that her pain is still present. Given her heart failure and kidney disease, she is not eligible for NSAIDs. Her gabapentin does not alleviate her pain. She has tried Lyrica and Cymbalta. She has tried creams in the past which have not worked. At last office visit attempts were made to increase her Voltaren gel to 3%, but there have been issues obtaining the medications. Ultimately, patient would benefit from weight loss, as this would greatly decrease her pain. She will follow up with PM&R.  - Will re-attempt voltaren gel 3% - follow up with PM&R

## 2020-01-22 ENCOUNTER — Other Ambulatory Visit: Payer: Self-pay | Admitting: Obstetrics & Gynecology

## 2020-01-22 ENCOUNTER — Other Ambulatory Visit: Payer: Self-pay | Admitting: Internal Medicine

## 2020-01-22 DIAGNOSIS — I5023 Acute on chronic systolic (congestive) heart failure: Secondary | ICD-10-CM

## 2020-01-22 DIAGNOSIS — N921 Excessive and frequent menstruation with irregular cycle: Secondary | ICD-10-CM

## 2020-01-25 ENCOUNTER — Ambulatory Visit (INDEPENDENT_AMBULATORY_CARE_PROVIDER_SITE_OTHER): Payer: Medicare Other | Admitting: Licensed Clinical Social Worker

## 2020-01-25 DIAGNOSIS — F331 Major depressive disorder, recurrent, moderate: Secondary | ICD-10-CM | POA: Diagnosis not present

## 2020-01-25 DIAGNOSIS — F4312 Post-traumatic stress disorder, chronic: Secondary | ICD-10-CM | POA: Diagnosis not present

## 2020-01-25 DIAGNOSIS — F410 Panic disorder [episodic paroxysmal anxiety] without agoraphobia: Secondary | ICD-10-CM | POA: Diagnosis not present

## 2020-01-25 DIAGNOSIS — G4733 Obstructive sleep apnea (adult) (pediatric): Secondary | ICD-10-CM | POA: Diagnosis not present

## 2020-01-25 DIAGNOSIS — J9620 Acute and chronic respiratory failure, unspecified whether with hypoxia or hypercapnia: Secondary | ICD-10-CM | POA: Diagnosis not present

## 2020-01-25 DIAGNOSIS — F411 Generalized anxiety disorder: Secondary | ICD-10-CM | POA: Diagnosis not present

## 2020-01-25 NOTE — Progress Notes (Signed)
Virtual Visit via Telephone Note  I connected with Lindsay [name] on 01/25/20 at  8:00 AM EST by telephone and verified that I am speaking with the correct person using two identifiers.  Location: Patient: home Provider: home office   I discussed the limitations, risks, security and privacy concerns of performing an evaluation and management service by telephone and the availability of in person appointments. I also discussed with the patient that there may be a patient responsible charge related to this service. The patient expressed understanding and agreed to proceed.   I discussed the assessment and treatment plan with the patient. The patient was provided an opportunity to ask questions and all were answered. The patient agreed with the plan and demonstrated an understanding of the instructions.   The patient was advised to call back or seek an in-person evaluation if the symptoms worsen or if the condition fails to improve as anticipated.  I provided 55 minutes of non-face-to-face time during this encounter.  THERAPIST PROGRESS NOTE  Session Time: 8:00 AM to 8:55 AM  Participation Level: Active  Behavioral Response: CasualAlertAngry, Anxious and Depressed  Type of Therapy: Individual Therapy  Treatment Goals addressed:  help cope with health issues, anxiety, triggers, coping  Interventions: Solution Focused, Strength-based, Supportive, Reframing and Other: coping  Summary: Lindsay [name] is a 49 y.o. female who presents with [facility] for pain and has to do weight management and pain management. Had been going every week to see the PCP but quit because only getting lab work. Not doing anything else so quit going. Basically the same even though going to doctors. Told her that she has pinched nerve down back and leg, degenerated disc and arthritis in back. Feels hasn't been given multiple medications and she said only nerve pills that don't help. "Serious things  going on." Therapist pointed out these are given for pain and picture complicated with her medical issues, at the same time validating patient on not getting the relief from these medications. Patient is really angry tired of seeing them every week because they are not doing anything for her. Barely can stand up. No matter what is going on blame it on weight have done that all her life. Therapist Asked her what her focus is with her medical care and patient relates she wants  to be healthy and live. "Walk, cook, breath." Goes to the heart specialist in January. Still bleeding and goes to see the gynecologist in January. Therapist explored whether following doctor plan will help eventually and patient says doesn't know. Encouraged patient best she can do is follow expert guidance selecting doctor's she thinks are helping. Discussed difficulty of life with suffering from medical issues. Remains difficult to do day to day things even go to the bathroom. Tells the doctor these things. Talked about mental health therapist from insurance who repeats the same thing from week to week. Thinks going to cancel because it is not working. Had her do a visualization/mindfulness activity. Talked about her doing self-care, can't do it in pain and finances. Things for self-care are praying, read the Bible when have things going on can't do things like that.  Therapist encouraged her to ask them why they are doing techniques and could be helpful to continue to get what she can from it.  Assessed patient not seeing value and may cancel his therapy sessions, patient finding therapeutic relationship with this therapist more helpful.  Suicidal/Homicidal: No  Therapist Response: Therapist reviewed symptoms, facilitated expression of thoughts and feelings validated patient on how she is feeling, utilize reframing to help patient remain hopeful and positive continuing to put her hands in providers as this is her best option for  help, working with weight management and pain management at eventually things will move forward following doctors advice, staying open to finding a provider who really hears her.  Reviewed some of the strategies utilized by behavioral health and insurance company how mindfulness helps her to stay focused on the present and gets her mind out of the train negative thoughts can be helpful, positive imagery and positive self statements also helps to distract negative thoughts, that we have the part of change thoughts and that thoughts can be unhelpful and distorted.  Explaining the strategies so that patient may get more use from them.  Identify therapeutic relationship is very helpful for patient.  Plan: Return again in 2 weeks.2.Therapist work with patient on depression, anxiety, processing through feelings related to past, managing health issues   Diagnosis: Axis I:   Major depressive disorder, recurrent, moderate, generalized anxiety disorder, chronic PTSD, panic attacks    Axis II: No diagnosis    Coolidge Breeze, LCSW 01/25/2020

## 2020-01-29 ENCOUNTER — Other Ambulatory Visit: Payer: Self-pay | Admitting: Internal Medicine

## 2020-01-29 DIAGNOSIS — E559 Vitamin D deficiency, unspecified: Secondary | ICD-10-CM

## 2020-01-31 ENCOUNTER — Telehealth: Payer: Self-pay | Admitting: *Deleted

## 2020-01-31 NOTE — Telephone Encounter (Signed)
Patient called in c/o feeling dizzy while lying on right side or when turning head to the right. This started yesterday AM. Denies any other symptoms. Has appt with PCP on 02/02/2019 but she feels she needs to be seen sooner. Advised patient go to [facility] as there are no appts available at [facility] today or tomorrow. Open hours relayed to patient. Kinnie Feil, BSN, RN-BC

## 2020-01-31 NOTE — Telephone Encounter (Signed)
Patient requesting Vitamin D refill. Level last checked 6 months ago. I have approved a 30 day supply, she has an appointment with me on 1/5 and I will recheck levels then and send additional refills if needed.

## 2020-01-31 NOTE — Telephone Encounter (Signed)
Next appt scheduled 02/02/20.

## 2020-02-02 ENCOUNTER — Ambulatory Visit (INDEPENDENT_AMBULATORY_CARE_PROVIDER_SITE_OTHER): Payer: Medicare Other | Admitting: Internal Medicine

## 2020-02-02 ENCOUNTER — Encounter: Payer: Self-pay | Admitting: Internal Medicine

## 2020-02-02 ENCOUNTER — Other Ambulatory Visit: Payer: Self-pay

## 2020-02-02 VITALS — BP 137/81 | HR 87 | Temp 99.0°F | Ht 70.0 in | Wt 399.2 lb

## 2020-02-02 DIAGNOSIS — I5042 Chronic combined systolic (congestive) and diastolic (congestive) heart failure: Secondary | ICD-10-CM | POA: Diagnosis not present

## 2020-02-02 DIAGNOSIS — Z1159 Encounter for screening for other viral diseases: Secondary | ICD-10-CM

## 2020-02-02 DIAGNOSIS — M549 Dorsalgia, unspecified: Secondary | ICD-10-CM

## 2020-02-02 DIAGNOSIS — E119 Type 2 diabetes mellitus without complications: Secondary | ICD-10-CM

## 2020-02-02 DIAGNOSIS — E785 Hyperlipidemia, unspecified: Secondary | ICD-10-CM

## 2020-02-02 DIAGNOSIS — E611 Iron deficiency: Secondary | ICD-10-CM

## 2020-02-02 DIAGNOSIS — I10 Essential (primary) hypertension: Secondary | ICD-10-CM | POA: Diagnosis not present

## 2020-02-02 DIAGNOSIS — E559 Vitamin D deficiency, unspecified: Secondary | ICD-10-CM

## 2020-02-02 DIAGNOSIS — M797 Fibromyalgia: Secondary | ICD-10-CM | POA: Diagnosis not present

## 2020-02-02 DIAGNOSIS — L732 Hidradenitis suppurativa: Secondary | ICD-10-CM

## 2020-02-02 DIAGNOSIS — K068 Other specified disorders of gingiva and edentulous alveolar ridge: Secondary | ICD-10-CM | POA: Diagnosis not present

## 2020-02-02 DIAGNOSIS — G8929 Other chronic pain: Secondary | ICD-10-CM

## 2020-02-02 DIAGNOSIS — R42 Dizziness and giddiness: Secondary | ICD-10-CM

## 2020-02-02 LAB — GLUCOSE, CAPILLARY: Glucose-Capillary: 119 mg/dL — ABNORMAL HIGH (ref 70–99)

## 2020-02-02 LAB — POCT GLYCOSYLATED HEMOGLOBIN (HGB A1C): Hemoglobin A1C: 7 % — AB (ref 4.0–5.6)

## 2020-02-02 MED ORDER — GABAPENTIN 300 MG PO CAPS: 900.0000 mg | ORAL_CAPSULE | Freq: Every day | ORAL | 0 refills | Status: DC

## 2020-02-02 NOTE — Progress Notes (Signed)
Internal Medicine Clinic Attending ? ?Case discussed with Dr. Winters  At the time of the visit.  We reviewed the resident?s history and exam and pertinent patient test results.  I agree with the assessment, diagnosis, and plan of care documented in the resident?s note.  ?

## 2020-02-02 NOTE — Progress Notes (Signed)
Subjective:   Patient ID: Lindsay [name] female   DOB: 1970-08-11 50 y.o.   MRN: [id number]  HPI: [name] is a 50 y.o. female with past medical history outlined below here for follow up of multiple chronic medical problems and new onset dizziness. For the details of today's visit, please refer to the assessment and plan.   Past Medical History:  Diagnosis Date  . Acid reflux   . Anemia    Iron Def  . Anorexia   . CHF (congestive heart failure) ([place])   . Chronic kidney disease    Nephrotic syndrome  . Colon polyp 2009  . Depression with anxiety   . Edema leg   . Fibromyalgia   . Hemorrhoids   . Hidradenitis suppurativa   . Hypertension   . IBS (irritable bowel syndrome)   . Low back pain   . Migraines   . Morbidly obese ([place])   . Neuromuscular disorder ([facility])    fibromyalgia  . Neuropathy   . Panic attacks   . Polyp of vocal cord or larynx   . Tonsil pain    Current Outpatient Medications  Medication Sig Dispense Refill  . acetaminophen (TYLENOL) 650 MG CR tablet Take 1,300 mg by mouth every 8 (eight) hours as needed for pain.    [name] albuterol (PROVENTIL) (2.5 MG/3ML) 0.083% nebulizer solution TAKE 3 MLS (2.5 MG TOTAL) BY NEBULIZATION EVERY 4 (FOUR) HOURS. 75 mL 12  . albuterol (VENTOLIN HFA) 108 (90 Base) MCG/ACT inhaler TAKE 2 PUFFS BY MOUTH EVERY 6 HOURS AS NEEDED FOR WHEEZE OR SHORTNESS OF BREATH (Patient taking differently: Inhale 2 puffs into the lungs every 6 (six) hours as needed for wheezing or shortness of breath. ) 8.5 each 5  . Ascorbic Acid (VITAMIN C PO) Take 1 tablet by mouth daily.    [name] BIOTIN PO Take 1 tablet by mouth daily.    . Brexpiprazole (REXULTI) 3 MG TABS Take 1 tablet (3 mg total) by mouth daily. 30 tablet 2  . capsaicin (ZOSTRIX) 0.025 % cream Apply 1 application topically at bedtime.    . Capsaicin 0.025 % PTCH Apply 1 patch topically daily.    . carvedilol (COREG) 3.125 MG tablet TAKE 1 TABLET BY MOUTH 2 TIMES DAILY. 60 tablet 0  .  cholecalciferol (VITAMIN D3) 25 MCG (1000 UNIT) tablet TAKE 1 TABLET BY MOUTH EVERY DAY 30 tablet 0  . clindamycin (CLINDAGEL) 1 % gel APPLY TO AFFECTED AREA TWICE A DAY 30 g 0  . clorazepate (TRANXENE) 3.75 MG tablet Take 1 tablet (3.75 mg total) by mouth daily. 30 tablet 2  . Diclofenac Sodium 3 % GEL Apply 2 g topically 2 (two) times daily as needed. 100 g 0  . ENTRESTO 24-26 MG Take 1 tablet by mouth 2 (two) times daily.    . famotidine (PEPCID) 20 MG tablet Take 20 mg by mouth daily.    [name] gabapentin (NEURONTIN) 600 MG tablet TAKE 1 TABLET BY MOUTH AT BEDTIME. 30 tablet 2  . hydrOXYzine (VISTARIL) 50 MG capsule Take 1 capsule (50 mg total) by mouth at bedtime. 30 capsule 2  . lamoTRIgine (LAMICTAL) 150 MG tablet Take 1/2 tab twice daily (Patient taking differently: Take 75 mg by mouth 2 (two) times daily. ) 90 tablet 0  . lidocaine (XYLOCAINE) 5 % ointment Apply 1 application topically 2 (two) times daily as needed. (Patient taking differently: Apply 1 application topically 2 (two) times daily as needed for mild pain. ) 35.44 g 0  .  megestrol (MEGACE) 40 MG tablet TAKE 2 TABLETS BY MOUTH EVERY DAY 60 tablet 1  . metoprolol succinate (TOPROL-XL) 50 MG 24 hr tablet Take 50 mg by mouth daily.    . naproxen (NAPROSYN) 500 MG tablet Take 500 mg by mouth 2 (two) times daily.    [name] omeprazole (PRILOSEC) 40 MG capsule TAKE 1 CAPSULE BY MOUTH EVERY DAY (Patient taking differently: Take 40 mg by mouth daily. ) 90 capsule 3  . Potassium Chloride ER 20 MEQ TBCR Take 40 mEq by mouth daily for 3 days. 3 tablet 0  . promethazine (PHENERGAN) 12.5 MG tablet TAKE 1 TABLET BY MOUTH EVERY 6 HOURS AS NEEDED FOR NAUSEA OR VOMITING. (Patient taking differently: Take 12.5 mg by mouth every 6 (six) hours as needed for nausea or vomiting. ) 90 tablet 1  . SYMBICORT 160-4.5 MCG/ACT inhaler Inhale 2 puffs into the lungs in the morning and at bedtime. 1 each 3  . torsemide (DEMADEX) 20 MG tablet TAKE 2 TABLETS BY MOUTH EVERY  DAY 60 tablet 2  . vitamin B-12 (CYANOCOBALAMIN) 1000 MCG tablet Take 1 tablet (1,000 mcg total) by mouth daily. 30 tablet 3  . Vitamin E 400 units TABS Take 400 Units by mouth daily.      No current facility-administered medications for this visit.   Family History  Problem Relation Age of Onset  . Diabetes Mother   . Heart disease Mother        valve leak  . Anxiety disorder Mother   . Depression Mother   . High blood pressure Mother   . Kidney failure Mother   . Cancer Father        prostate  . Heart disease Father   . Learning disabilities Sister   . Depression Sister   . Depression Sister   . Anxiety disorder Sister   . Colon cancer Neg Hx    Social History   Socioeconomic History  . Marital status: Single    Spouse name: Not on file  . Number of children: 1  . Years of education: 89  . Highest education level: Not on file  Occupational History  . Occupation: Disabled  Tobacco Use  . Smoking status: Former Smoker    Packs/day: 0.10    Years: 25.00    Pack years: 2.50    Types: Cigarettes    Quit date: 05/24/2019    Years since quitting: 0.6  . Smokeless tobacco: Never Used  . Tobacco comment: quit in April.  Vaping Use  . Vaping Use: Never used  Substance and Sexual Activity  . Alcohol use: Not Currently    Alcohol/week: 0.0 standard drinks    Comment: none sine 05/2018  . Drug use: Not Currently    Frequency: 3.0 times per week    Types: Marijuana  . Sexual activity: Not Currently    Birth control/protection: Implant  Other Topics Concern  . Not on file  Social History Narrative   Lives at home w/ her mother and daughter   Right-handed   Caffeine: about 3 Cokes per week   Social Determinants of Health   Financial Resource Strain: Not on file  Food Insecurity: Not on file  Transportation Needs: Not on file  Physical Activity: Not on file  Stress: Not on file  Social Connections: Not on file    Review of Systems: ROS   Objective:   Physical Exam:  Vitals:   02/02/20 0840  BP: 137/81  Pulse: 87  Temp: 99  F (37.2 C)  TempSrc: Oral  SpO2: 95%  Weight: (!) 399 lb 3.2 oz (181.1 kg)  Height: 5\' 10"  (1.778 m)    Physical Exam Constitutional:      Appearance: Normal appearance.  HENT:     Mouth/Throat:     Comments: 2-3 cm gum mass on her lower jaw Cardiovascular:     Rate and Rhythm: Normal rate and regular rhythm.  Pulmonary:     Effort: Pulmonary effort is normal. No respiratory distress.     Breath sounds: Normal breath sounds.  Musculoskeletal:        General: No swelling.  Skin:    General: Skin is warm and dry.  Neurological:     Mental Status: She is alert.     Sensory: No sensory deficit.     Motor: No weakness.     Gait: Gait normal.     Comments: EOM intact, no dysconjugate gaze. Left sided, horizontal beating nystagmus when looking to the left. Mild finger to nose dysmetry.       Assessment & Plan:   See Encounters Tab for problem based charting.

## 2020-02-02 NOTE — Patient Instructions (Signed)
Mr. Bjorklund,  I will call you with the results of your blood work when it results. I have ordered an MRI of your brian, you will be called to schedule this. Follow up with me again in 3 months.   If you have any questions or concerns, call our clinic at [phone] or after hours call [phone] and ask for the internal medicine resident on call. Thank you!  Dr. Antony Contras

## 2020-02-02 NOTE — Assessment & Plan Note (Signed)
Well-controlled today.  Multiple recent changes to her regimen.  HCTZ was discontinued due to hypokalemia and she was started on Coreg instead.  Subsequently seen by her cardiologist and valsartan was changed to Entresto, Coreg discontinued and she was started on metoprolol.  Checking BMP today.  Continue current plan. -- Entresto 24-25 mg BID  -- Metoprolol XL 50 mg daily

## 2020-02-03 ENCOUNTER — Encounter: Payer: Self-pay | Admitting: Internal Medicine

## 2020-02-03 ENCOUNTER — Other Ambulatory Visit: Payer: Self-pay | Admitting: Internal Medicine

## 2020-02-03 ENCOUNTER — Telehealth: Payer: Self-pay

## 2020-02-03 DIAGNOSIS — M25562 Pain in left knee: Secondary | ICD-10-CM

## 2020-02-03 DIAGNOSIS — E785 Hyperlipidemia, unspecified: Secondary | ICD-10-CM | POA: Insufficient documentation

## 2020-02-03 DIAGNOSIS — M25561 Pain in right knee: Secondary | ICD-10-CM

## 2020-02-03 DIAGNOSIS — E119 Type 2 diabetes mellitus without complications: Secondary | ICD-10-CM | POA: Insufficient documentation

## 2020-02-03 LAB — BMP8+ANION GAP
Anion Gap: 16 mmol/L (ref 10.0–18.0)
BUN/Creatinine Ratio: 10 (ref 9–23)
BUN: 11 mg/dL (ref 6–24)
CO2: 23 mmol/L (ref 20–29)
Calcium: 9.6 mg/dL (ref 8.7–10.2)
Chloride: 98 mmol/L (ref 96–106)
Creatinine, Ser: 1.11 mg/dL — ABNORMAL HIGH (ref 0.57–1.00)
GFR calc Af Amer: 67 mL/min/{1.73_m2} (ref >59)
GFR calc non Af Amer: 58 mL/min/{1.73_m2} — ABNORMAL LOW (ref >59)
Glucose: 116 mg/dL — ABNORMAL HIGH (ref 65–99)
Potassium: 3.4 mmol/L — ABNORMAL LOW (ref 3.5–5.2)
Sodium: 137 mmol/L (ref 134–144)

## 2020-02-03 LAB — LIPID PANEL
Chol/HDL Ratio: 4 ratio (ref 0.0–4.4)
Cholesterol, Total: 137 mg/dL (ref 100–199)
HDL: 34 mg/dL — ABNORMAL LOW (ref >39)
LDL Chol Calc (NIH): 86 mg/dL (ref 0–99)
Triglycerides: 86 mg/dL (ref 0–149)
VLDL Cholesterol Cal: 17 mg/dL (ref 5–40)

## 2020-02-03 LAB — HEPATITIS C ANTIBODY: Hep C Virus Ab: <0.1 s/co ratio (ref 0.0–0.9)

## 2020-02-03 LAB — FERRITIN: Ferritin: 71 ng/mL (ref 15–150)

## 2020-02-03 LAB — VITAMIN D 25 HYDROXY (VIT D DEFICIENCY, FRACTURES): Vit D, 25-Hydroxy: 31.9 ng/mL (ref 30.0–100.0)

## 2020-02-03 MED ORDER — METFORMIN HCL ER 500 MG PO TB24: 500.0000 mg | ORAL_TABLET | Freq: Every day | ORAL | 1 refills | Status: DC

## 2020-02-03 MED ORDER — ATORVASTATIN CALCIUM 40 MG PO TABS: 40.0000 mg | ORAL_TABLET | Freq: Every day | ORAL | 1 refills | Status: DC

## 2020-02-03 NOTE — Assessment & Plan Note (Signed)
Patient has chronic combined HFmrEF and diastolic failure, recently admitted last November for exacerbation. She appears euvolemic today. She follows with cardiology and her ARB was changed to entresto, and coreg changed to metoprolol XL. BP is well controlled today and she appears euvolemic. Down 8lbs since last month but she has also been dieting. She reports her diuretics were changed from torsemide to lasix as well but unsure of the dose. I will request cardiology records.

## 2020-02-03 NOTE — Assessment & Plan Note (Signed)
Patient has chronic back pain with bilateral sciatica 2/2 her morbid obesity. She follows with orthopedics. Has failed multiple medications and bilateral L4-L5 and L5-S1 facet injections, not appropriate for opioids for various reason. MRI lumbar spine last year showed no evidence of DDD or significantly lumbar disease. Ortho has recommended weight loss, pain control, and PRN follow up. Weight loss counseling is ongoing, and she has lost 8lbs since last month. Unsure if she would be a candidate for bariatric surgery with her chronic hypoxic respiratory failure but encouraged her to attend the bariatric surgery seminar.

## 2020-02-03 NOTE — Assessment & Plan Note (Addendum)
New diagnosis, Hgb A1c is 7.0, checked today due to concern for possible CVA. Attempted to call patient with results, no answer. Will plan to start low dose metformin after speaking with her about results.   ADDNEDUM: Called patient with results. We discussed starting GLP-1 agonist for dual purpose of glycemic control and weight loss, however she would prefer to start with metformin for now and will consider injections at next appointment. Sent prescription for metformin 500 mg XL once daily, follow up 3 months.

## 2020-02-03 NOTE — Assessment & Plan Note (Signed)
Patient has morbid obesity with multiple resulting health complications. She has been dieting by cutting out sweets, soda, and limiting her juice intake. Also watching portion control. She has lost 8lbs since last month. She also has HFmrEF on diuretic therapy so somewhat difficult to know if there was a component of fluid overload. Regardless, encouraged her to keep up the good work. I also continue to recommend she attend the [facility] bariatric surgery seminars. She does not have transportation in the evening but there are virtual seminars available as well.

## 2020-02-03 NOTE — Addendum Note (Signed)
Addended by: Burnell Blanks on: 02/03/2020 03:10 PM   Modules accepted: Orders

## 2020-02-03 NOTE — Assessment & Plan Note (Signed)
Currently well controlled, she denies symptoms. Uses clindamycin gel PRN for flairs.

## 2020-02-03 NOTE — Assessment & Plan Note (Signed)
Hx of IDA requiring intermittent IV iron infusions. Repeat ferritin today is WNL. Continue to monitor.

## 2020-02-03 NOTE — Assessment & Plan Note (Signed)
Patient is here with complaint of persistent dizziness with acute onset 4 days ago. She was getting out of bed when she suddenly felt very dizzy, especially when looking to the left side. Dizziness is constant, denies periods of normalcy which would be more consistent with BPPV. On neurological exam, her extraoccular muscles are intact, no dysconjugate gaze, however she has a horizontal beating nystagmus when looking to the left side. Unable to perform HINTS exam due to dizziness. Unable to do Dix-Hallpike or Eply due to her morbid obesity. She has some mild finger to nose dysmetry but otherwise no deficits. Romberg test is negative and there is no pronator drift. Given her multiple co morbidities, she is at increased risk for CVA and we discussed MRI to rule out posterior circulation infarct which she is agreeable too. Also on the differential would be a vestibular neuritis although she denies any recent illness.  -- MRI brain for posterior CVA rule out  -- Checking hemoglobin A1c and lipid panel

## 2020-02-03 NOTE — Telephone Encounter (Signed)
Requesting lab results, please call pt back.  

## 2020-02-03 NOTE — Assessment & Plan Note (Addendum)
Patient has a ~2-3 cm gum lesion on her lower jaw (see pictures under media tab). She is following with dentistry, recently referred to oral surgery. Was told she was too high risk for anesthesia and needs a periodontist. Has already been referred.

## 2020-02-03 NOTE — Assessment & Plan Note (Signed)
Patient has chronic vitamin D deficiency. Levels today are normal on 1,000 units daily. Continue current plan.

## 2020-02-03 NOTE — Assessment & Plan Note (Signed)
Lipid panel checked today with total cholesterol 137 and LDL 86. 10 years ASCVD risk is elevated at 14.2%. Discussed results with patient given elevated risk and new diagnosis of DM, will start atorvastatin 40 mg daily. Prescription sent to her pharmacy.

## 2020-02-04 ENCOUNTER — Encounter: Payer: Self-pay | Admitting: Internal Medicine

## 2020-02-04 ENCOUNTER — Other Ambulatory Visit: Payer: Self-pay | Admitting: Student

## 2020-02-04 NOTE — Telephone Encounter (Signed)
Patient is requesting we send her recent labs to her cardiologist, Dr. Terrence Dupont. Thank you!

## 2020-02-06 DIAGNOSIS — J9620 Acute and chronic respiratory failure, unspecified whether with hypoxia or hypercapnia: Secondary | ICD-10-CM | POA: Diagnosis not present

## 2020-02-07 ENCOUNTER — Other Ambulatory Visit: Payer: Self-pay | Admitting: Internal Medicine

## 2020-02-07 ENCOUNTER — Other Ambulatory Visit: Payer: Self-pay | Admitting: Primary Care

## 2020-02-07 DIAGNOSIS — I10 Essential (primary) hypertension: Secondary | ICD-10-CM

## 2020-02-08 ENCOUNTER — Other Ambulatory Visit: Payer: Self-pay | Admitting: Internal Medicine

## 2020-02-08 DIAGNOSIS — L732 Hidradenitis suppurativa: Secondary | ICD-10-CM

## 2020-02-09 ENCOUNTER — Encounter: Payer: Self-pay | Admitting: *Deleted

## 2020-02-09 DIAGNOSIS — G4733 Obstructive sleep apnea (adult) (pediatric): Secondary | ICD-10-CM | POA: Diagnosis not present

## 2020-02-09 NOTE — Telephone Encounter (Signed)
Refill request for clindamycin gel denied. I just saw patient last week in clinic. Asked about frequent refill request for this medication and she says they are automatic refills from the pharmacy and she does not need it that often.

## 2020-02-10 ENCOUNTER — Other Ambulatory Visit: Payer: Self-pay | Admitting: Gastroenterology

## 2020-02-10 ENCOUNTER — Encounter: Payer: Self-pay | Admitting: Internal Medicine

## 2020-02-10 DIAGNOSIS — R5381 Other malaise: Secondary | ICD-10-CM

## 2020-02-10 DIAGNOSIS — G8929 Other chronic pain: Secondary | ICD-10-CM

## 2020-02-10 DIAGNOSIS — E119 Type 2 diabetes mellitus without complications: Secondary | ICD-10-CM

## 2020-02-10 DIAGNOSIS — M5136 Other intervertebral disc degeneration, lumbar region: Secondary | ICD-10-CM

## 2020-02-11 ENCOUNTER — Ambulatory Visit (INDEPENDENT_AMBULATORY_CARE_PROVIDER_SITE_OTHER): Payer: Medicare Other | Admitting: Licensed Clinical Social Worker

## 2020-02-11 DIAGNOSIS — E785 Hyperlipidemia, unspecified: Secondary | ICD-10-CM | POA: Diagnosis not present

## 2020-02-11 DIAGNOSIS — E119 Type 2 diabetes mellitus without complications: Secondary | ICD-10-CM | POA: Diagnosis not present

## 2020-02-11 DIAGNOSIS — J449 Chronic obstructive pulmonary disease, unspecified: Secondary | ICD-10-CM | POA: Diagnosis not present

## 2020-02-11 DIAGNOSIS — J9610 Chronic respiratory failure, unspecified whether with hypoxia or hypercapnia: Secondary | ICD-10-CM | POA: Diagnosis not present

## 2020-02-11 DIAGNOSIS — F4312 Post-traumatic stress disorder, chronic: Secondary | ICD-10-CM | POA: Diagnosis not present

## 2020-02-11 DIAGNOSIS — F331 Major depressive disorder, recurrent, moderate: Secondary | ICD-10-CM | POA: Diagnosis not present

## 2020-02-11 DIAGNOSIS — I10 Essential (primary) hypertension: Secondary | ICD-10-CM | POA: Diagnosis not present

## 2020-02-11 DIAGNOSIS — F411 Generalized anxiety disorder: Secondary | ICD-10-CM

## 2020-02-11 DIAGNOSIS — F410 Panic disorder [episodic paroxysmal anxiety] without agoraphobia: Secondary | ICD-10-CM

## 2020-02-11 DIAGNOSIS — I509 Heart failure, unspecified: Secondary | ICD-10-CM | POA: Diagnosis not present

## 2020-02-11 NOTE — Progress Notes (Signed)
Virtual Visit via Telephone Note  I connected with Lindsay [name] on 02/11/20 at  8:00 AM EST by telephone and verified that I am speaking with the correct person using two identifiers.  Location: Patient: home Provider: home office    I discussed the limitations, risks, security and privacy concerns of performing an evaluation and management service by telephone and the availability of in person appointments. I also discussed with the patient that there may be a patient responsible charge related to this service. The patient expressed understanding and agreed to proceed.  I discussed the assessment and treatment plan with the patient. The patient was provided an opportunity to ask questions and all were answered. The patient agreed with the plan and demonstrated an understanding of the instructions.   The patient was advised to call back or seek an in-person evaluation if the symptoms worsen or if the condition fails to improve as anticipated.  I provided 40 minutes of non-face-to-face time during this encounter.   THERAPIST PROGRESS NOTE  Session Time: 8:00 AM to 8:40 AM  Participation Level: Active  Behavioral Response: CasualAlertAngry, Anxious and Dysphoric  Type of Therapy: Individual Therapy  Treatment Goals addressed:  help cope with health issues, anxiety, triggers, coping  Interventions: Solution Focused, Strength-based, Supportive, Reframing and Other: coping  Summary: Lindsay [name] is a 50 y.o. female who presents with going to doctor heart specialist today. Went last month. Familiar with him through mom and "he does the work". He does internal medicine, wants to ask him to be her doctor. He changed the medicines and cut back on fluid pill. Requested her blood work. On 16th went to orthopedic place and they haven't gotten her records yet. Wanted to send her back to place that violated HIPAA. Patient called [facility] and see if they would treat her now because  may have different diagnosis with orthopedic (said couldn't do anything for her before except biofeedback). Feeling down with everything to be honest with therapist. Regular doctor finally back. Did blood work. Diabetic and high cholesterol now every time goes to doctor a new diagnosis. Doctor ignoring things the nurses say about patient about pain issues and other things. They know more about her than residents. Pain and barely walk don't even want go to the doctor. Felt like giving birth at 1 AM in the morning. Sometimes urinate on herself because of water pills and can't get to bathroom fast enough. Cramps and bleeding and woke up in a pool or blood. Supposed to go to Monday obgyn but going next Friday to them because closing office. Tuesday getting an MRI to check to see if she has a stroke describes that this test relates to when head was dizzy and when went to right side and was leaning over. Left knee hurts bad. Send her things about physical therapy. Can't move and on and oxygen so how could she do physical therapy. Angry at doctors but try to stay calm don't want to elevate her heart rate. Still fed up with doctors. Lost 8 pounds drinking more water watching what eat and how much. Heart doctor is only person she can trust and can't miss any of her appointments want to stay on good terms.          Therapist reviewed symptoms, facilitated expression of thoughts and feelings validated patient on her feeling overwhelmed and down, angry going through all her health issues. Discussed therapist impression it makes a big difference if she likes her provider changes  her mental state about her treatment which from knowing patient for little while seems to have a positive effect for patient. Noted a couple positive developments and utilize that for reframing to point them out to patient including having a doctor she can trust and losing weight. Therapist encourages patient to stay on track seeing doctors as their  treatment will make a difference in her health issues which impacts quality of life. Noted pain issues as one of the priorities. Discussed accomplishing losing weight can have a positive effect and moving her forward in a positive direction having a sense of accomplishment can be a positive motivator. Therapist continues to play a role as support, somebody patient can talk to as helpful intervention for patient. Therapist provided active listening, open questions supportive interventions. Suicidal/Homicidal: No  Plan: Return again in 2 weeks.2.help cope with health issues, stress management  Diagnosis: Axis I: Major depressive disorder, recurrent, moderate, generalized anxiety disorder, chronic PTSD, panic attacks    Axis II: No diagnosis    [name], LCSW 02/11/2020

## 2020-02-14 ENCOUNTER — Ambulatory Visit: Payer: Medicare Other | Admitting: Family Medicine

## 2020-02-15 ENCOUNTER — Ambulatory Visit (HOSPITAL_COMMUNITY): Admission: RE | Admit: 2020-02-15 | Payer: Medicare Other | Source: Ambulatory Visit

## 2020-02-16 ENCOUNTER — Other Ambulatory Visit: Payer: Self-pay | Admitting: Obstetrics & Gynecology

## 2020-02-16 DIAGNOSIS — N921 Excessive and frequent menstruation with irregular cycle: Secondary | ICD-10-CM

## 2020-02-16 MED ORDER — ACCU-CHEK FASTCLIX LANCETS MISC: 1 refills | Status: DC

## 2020-02-16 MED ORDER — DICLOFENAC SODIUM 3 % EX GEL
2.0000 g | Freq: Two times a day (BID) | CUTANEOUS | 0 refills | Status: DC | PRN
Start: 2020-02-16 — End: 2020-02-18

## 2020-02-16 MED ORDER — CAPSAICIN 0.025 % EX PTCH
1.0000 | MEDICATED_PATCH | Freq: Every day | CUTANEOUS | 9 refills | Status: DC | PRN
Start: 2020-02-16 — End: 2022-08-10

## 2020-02-16 MED ORDER — ACCU-CHEK GUIDE VI STRP: ORAL_STRIP | 1 refills | Status: DC

## 2020-02-16 MED ORDER — ACCU-CHEK GUIDE ME W/DEVICE KIT
PACK | 0 refills | Status: DC
Start: 2020-02-16 — End: 2023-08-28

## 2020-02-17 ENCOUNTER — Telehealth: Payer: Self-pay | Admitting: *Deleted

## 2020-02-17 DIAGNOSIS — M1710 Unilateral primary osteoarthritis, unspecified knee: Secondary | ICD-10-CM | POA: Diagnosis not present

## 2020-02-17 DIAGNOSIS — G894 Chronic pain syndrome: Secondary | ICD-10-CM | POA: Diagnosis not present

## 2020-02-17 DIAGNOSIS — M25519 Pain in unspecified shoulder: Secondary | ICD-10-CM | POA: Diagnosis not present

## 2020-02-17 DIAGNOSIS — M545 Low back pain, unspecified: Secondary | ICD-10-CM | POA: Diagnosis not present

## 2020-02-17 NOTE — Telephone Encounter (Addendum)
Information was completed and faxed to [place] for PA for Diclofenac Gel 3%.  Awaiting determination.  Sander Nephew, RN 02/17/2020 10:46 AM. Information was refaxed after call to representative who could not locate the information sent in on 02/17/2020.  Given new fax # [phone].  Information was resent .  Awaiting determination.

## 2020-02-18 ENCOUNTER — Other Ambulatory Visit: Payer: Self-pay

## 2020-02-18 ENCOUNTER — Telehealth (INDEPENDENT_AMBULATORY_CARE_PROVIDER_SITE_OTHER): Payer: Medicare Other | Admitting: Family Medicine

## 2020-02-18 ENCOUNTER — Encounter: Payer: Self-pay | Admitting: Family Medicine

## 2020-02-18 DIAGNOSIS — N921 Excessive and frequent menstruation with irregular cycle: Secondary | ICD-10-CM

## 2020-02-18 NOTE — Progress Notes (Signed)
Called pt at [phone]; VM not set up, unable to leave VM. MyChart text link sent.   Called pt at 61; pt states she has been waiting in [place] room but left due to this phone call. I asked pt to please rejoin call and I will meet her there.   I connected with  [name] on 02/18/20 at 1044 by MyChart and verified that I am speaking with the correct person using two identifiers.   I discussed the limitations, risks, security and privacy concerns of performing an evaluation and management service by telephone and the availability of in person appointments. I also discussed with the patient that there may be a patient responsible charge related to this service. The patient expressed understanding and agreed to proceed.  Annabell Howells, RN 02/18/2020  10:44 AM

## 2020-02-18 NOTE — Progress Notes (Signed)
GYNECOLOGY VIRTUAL VISIT ENCOUNTER NOTE  Provider location: [facility]   I connected with [name] on 02/18/20 at 10:15 AM EST by MyChart Video Encounter at home and verified that I am speaking with the correct person using two identifiers.   I discussed the limitations, risks, security and privacy concerns of performing an evaluation and management service virtually and the availability of in person appointments. I also discussed with the patient that there may be a patient responsible charge related to this service. The patient expressed understanding and agreed to proceed.   History:  Lindsay [name] is a 50 y.o. G49P1011 female being evaluated today for vaginal bleeding. H/o SVD x 1. Also with h/o morbid obesity, HTN, DM, OSA and smoking quit 4/21. Previously with IUD (got embedded and so removed) and Nexplanon placed 10/2018. Negative EMB at that time. Has on-going bleeding. Recently with more bleeding and new dx of HFrEF and DD. On Megace 80 mg bid which controls her bleeding.  She denies any abnormal vaginal discharge, bleeding, pelvic pain or other concerns.       Past Medical History:  Diagnosis Date  . Acid reflux   . Anemia    Iron Def  . Anorexia   . CHF (congestive heart failure) ([place])   . Chronic kidney disease    Nephrotic syndrome  . Colon polyp 2009  . Depression with anxiety   . Edema leg   . Fibromyalgia   . Hemorrhoids   . Hidradenitis suppurativa   . Hypertension   . IBS (irritable bowel syndrome)   . Low back pain   . Migraines   . Morbidly obese ([place])   . Neuromuscular disorder ([facility])    fibromyalgia  . Neuropathy   . Panic attacks   . Polyp of vocal cord or larynx   . Tonsil pain    Past Surgical History:  Procedure Laterality Date  . AXILLARY HIDRADENITIS EXCISION    . COLONOSCOPY WITH PROPOFOL N/A 05/25/2015   Procedure: COLONOSCOPY WITH PROPOFOL;  Surgeon: Milus Banister, MD;  Location: [facility]  ENDOSCOPY;  Service: Endoscopy;  Laterality: N/A;  . COLONOSCOPY WITH PROPOFOL N/A 12/31/2018   Procedure: COLONOSCOPY WITH PROPOFOL;  Surgeon: Milus Banister, MD;  Location: [facility] ENDOSCOPY;  Service: Endoscopy;  Laterality: N/A;  . HEMORRHOID SURGERY     with Hidradenitis surgery   . INGUINAL HIDRADENITIS EXCISION    . TONSILLECTOMY  10/18/2010   by Dr. Wilburn Cornelia  . UPPER GASTROINTESTINAL ENDOSCOPY     The following portions of the patient's history were reviewed and updated as appropriate: allergies, current medications, past family history, past medical history, past social history, past surgical history and problem list.   Health Maintenance:  Normal pap and negative HRHPV on 11/18/2018.  Normal mammogram on 01/25/2020.   Review of Systems:  Pertinent items noted in HPI and remainder of comprehensive ROS otherwise negative.  Physical Exam:   General:  Alert, oriented and cooperative. Patient appears to be in no acute distress.  Mental Status: Normal mood and affect. Normal behavior. Normal judgment and thought content.   Respiratory: Normal respiratory effort, no problems with respiration noted  Rest of physical exam deferred due to type of encounter  Labs and Imaging No results found for this or any previous visit (from the past 336 hour(s)). No results found.     Assessment and Plan:     Problem List Items Addressed This Visit  Unprioritized   Menorrhagia    Goal is to limit progesterone to as little needed to prevent bleeding. Endometrium protected against hyperplasia and carcinoma with Nexplanon in place. May use Megace 80 mg bid, then decrease to 40 mg bid, then to 40 mg daily, then every other day, to as little as needed to prevent bleeding.              I discussed the assessment and treatment plan with the patient. The patient was provided an opportunity to ask questions and all were answered. The patient agreed with the plan and demonstrated an understanding  of the instructions.   The patient was advised to call back or seek an in-person evaluation/go to the ED if the symptoms worsen or if the condition fails to improve as anticipated.  I provided 21 minutes of face-to-face time during this encounter.   Donnamae Jude, MD [facility], [place]

## 2020-02-18 NOTE — Assessment & Plan Note (Signed)
Goal is to limit progesterone to as little needed to prevent bleeding. Endometrium protected against hyperplasia and carcinoma with Nexplanon in place. May use Megace 80 mg bid, then decrease to 40 mg bid, then to 40 mg daily, then every other day, to as little as needed to prevent bleeding.

## 2020-02-18 NOTE — Patient Instructions (Signed)
Megace 80 mg bid, then drop to 40 mg bid, then go to 40 mg daily

## 2020-02-20 ENCOUNTER — Ambulatory Visit (HOSPITAL_COMMUNITY): Payer: Medicare Other

## 2020-02-23 ENCOUNTER — Other Ambulatory Visit: Payer: Self-pay | Admitting: Internal Medicine

## 2020-02-23 DIAGNOSIS — E559 Vitamin D deficiency, unspecified: Secondary | ICD-10-CM

## 2020-02-25 ENCOUNTER — Ambulatory Visit (INDEPENDENT_AMBULATORY_CARE_PROVIDER_SITE_OTHER): Payer: Medicare Other | Admitting: Licensed Clinical Social Worker

## 2020-02-25 DIAGNOSIS — F331 Major depressive disorder, recurrent, moderate: Secondary | ICD-10-CM

## 2020-02-25 DIAGNOSIS — F4312 Post-traumatic stress disorder, chronic: Secondary | ICD-10-CM | POA: Diagnosis not present

## 2020-02-25 DIAGNOSIS — F411 Generalized anxiety disorder: Secondary | ICD-10-CM

## 2020-02-25 DIAGNOSIS — J9620 Acute and chronic respiratory failure, unspecified whether with hypoxia or hypercapnia: Secondary | ICD-10-CM | POA: Diagnosis not present

## 2020-02-25 DIAGNOSIS — F410 Panic disorder [episodic paroxysmal anxiety] without agoraphobia: Secondary | ICD-10-CM

## 2020-02-25 NOTE — Progress Notes (Signed)
Virtual Visit via Telephone Note  I connected with Lindsay [name] on 02/25/20 at  8:00 AM EST by telephone and verified that I am speaking with the correct person using two identifiers.  Location: Patient: home Provider: home office   I discussed the limitations, risks, security and privacy concerns of performing an evaluation and management service by telephone and the availability of in person appointments. I also discussed with the patient that there may be a patient responsible charge related to this service. The patient expressed understanding and agreed to proceed.   I discussed the assessment and treatment plan with the patient. The patient was provided an opportunity to ask questions and all were answered. The patient agreed with the plan and demonstrated an understanding of the instructions.   The patient was advised to call back or seek an in-person evaluation if the symptoms worsen or if the condition fails to improve as anticipated.  I provided 52 minutes of non-face-to-face time during this encounter.  THERAPIST PROGRESS NOTE  Session Time: 8:00 AM to 8:52 AM  Participation Level: Active  Behavioral Response: CasualAlertAnxious and Dysphoric  Type of Therapy: Individual Therapy  Treatment Goals addressed:  help cope with health issues, anxiety, triggers, coping  Interventions: Motivational Interviewing, Strength-based, Supportive and Other: coping  Summary: Lindsay [name] is a 50 y.o. female who presents with went to heart doctor and asked him to be her main doctor. He says given sees other doctors in [place] so should stay in [place]. Was disappointed in that. He asked about bariatric surgery so patient thinks that he may be ok with this surgery even with heart issues. He encouraged intermittent fasting. 16-8. At least once a week. Tries every day. Not eating after 6 is hard.  Therapist provided positive feedback for patient efforts to lose weight.  Went to pain  management and didn't have any records once again. Don't like what he said that she was "eager and anxious". Not anxious and wants the pain to go away. Reviewed pain issues in knees, back to butt and thighs. Reason is degenerative disc disease fragments in lower back. Osteoarthritis in shoulders. He had her increase Gaba to twice a day 600 mg, was 900 mg at night. Goes back to pain doctor February 17. Says pain pill will aggravate the fibromyalgia, why originally sent but in reality have a lot of issues that need addressed and doesn't help to focus on fibromyalgia when pain needs managed. Shared issues of when waiting get panic attacks. Make her wait for her transportation has an hour window. Wants to switch clinics not going to help stuck on fibromyalgia. Providers want to prescribe Lyrica, Cymbalta, Gaba. Doesn't do anything. Patient shares something in left leg and describes the sensation, something stretched and not bending that they haven't uncovered what is going on there. Has to get a dentist appointment to take teeth out. Issues with putting to sleep with heart issues. Appointment with obgyn take medicine everyday so won't bleed at all. Can't do any type of surgery because heart failure. It is disadvantage it stops getting procedures done that may have done otherwise. Feels always about weight. "Tell me what is going on." Does compress her lungs and don't need that. Reviewed sessions as helpful for patient she explains getting things off chest and to be heard and understood. think it is in her head by everyone else. Mom sister and daughter listen don't think crazy but can't do anything for her but pray. Also tell patient  to keep head up and stay strong. She prays every day. She wants to get back to place where he (God) can meet me no matter where she is talk to God. Instead of cussing out go where only God and her meet and pray. Have to learn to let go. MRI on 14th concern with dizziness if is in stroke.  Will have to change time of appointment with psychiatrist.        Therapist reviewed symptoms, facilitated expression of thoughts and feelings, identified for patient significant intervention of being heard and understood in sessions that helps with symptoms, included and that is expression of feelings as significant intervention that helps with coping.  Validated patient on how she was feeling.  Reviewed treatment plan and patient gave consent to complete by phone.  Reviewing treatment plan assesses helpful intervention when we assess progress and talked about patient getting back to activities she enjoys and patient's own view asserted that she sees that happening.  Therapist identified a little more improvement on medical issues and she will improve functioning that will make it possible so interventions were helpful and positive.  Continue to work on treatment goal of supporting patient with managing getting care for health issues in the roadblocks she encounters validating patient on frustration around this.  Noted coping strategy of spirituality helping person be in mind space that helps cope with the stressors.  Therapist assesses supportive interventions help patient utilize her own resources to navigate health system, note these resources in place as patient manages getting help for her health issues.  Therapist provided active listening open questions supportive interventions. Suicidal/Homicidal: No  Plan: Return again in 1 week.2.help cope with health issues, stress management   Diagnosis: Axis I:  Major depressive disorder, recurrent, moderate, generalized anxiety disorder, chronic PTSD, panic attacks    Axis II: No diagnosis    [name], LCSW 02/25/2020

## 2020-03-01 ENCOUNTER — Ambulatory Visit (HOSPITAL_COMMUNITY): Payer: Medicare Other

## 2020-03-03 ENCOUNTER — Ambulatory Visit (INDEPENDENT_AMBULATORY_CARE_PROVIDER_SITE_OTHER): Payer: Medicare Other | Admitting: Licensed Clinical Social Worker

## 2020-03-03 DIAGNOSIS — F410 Panic disorder [episodic paroxysmal anxiety] without agoraphobia: Secondary | ICD-10-CM | POA: Diagnosis not present

## 2020-03-03 DIAGNOSIS — F411 Generalized anxiety disorder: Secondary | ICD-10-CM | POA: Diagnosis not present

## 2020-03-03 DIAGNOSIS — F4312 Post-traumatic stress disorder, chronic: Secondary | ICD-10-CM

## 2020-03-03 DIAGNOSIS — F331 Major depressive disorder, recurrent, moderate: Secondary | ICD-10-CM

## 2020-03-03 NOTE — Progress Notes (Signed)
Virtual Visit via Telephone Note  I connected with Lindsay [name] on 03/03/20 at  8:00 AM EST by telephone and verified that I am speaking with the correct person using two identifiers.  Location: Patient: home Provider: home office   I discussed the limitations, risks, security and privacy concerns of performing an evaluation and management service by telephone and the availability of in person appointments. I also discussed with the patient that there may be a patient responsible charge related to this service. The patient expressed understanding and agreed to proceed.   I discussed the assessment and treatment plan with the patient. The patient was provided an opportunity to ask questions and all were answered. The patient agreed with the plan and demonstrated an understanding of the instructions.   The patient was advised to call back or seek an in-person evaluation if the symptoms worsen or if the condition fails to improve as anticipated.  I provided 20 minutes of non-face-to-face time during this encounter.  THERAPIST PROGRESS NOTE  Session Time: 8:00 AM to 8:20 AM  Participation Level: Active  Behavioral Response: CasualAlertAnxious and Dysphoric  Type of Therapy: Individual Therapy  Treatment Goals addressed:  help cope with health issues, anxiety, triggers, coping   Interventions: Motivational Interviewing, Strength-based, Supportive and Other: coping  Summary: Lindsay [name] is a 50 y.o. female who presents with since last time since talked hasn't slept when try to breath hurts her lower back and the pain is in leg and knees and hurts to bend in left leg and knee cap "it is like a bone popping" was doing it but not to this intensity. Right one hurts but not to the intensity of the left leg. Feet are numb, aching, tingling a lot. "It is a lot." Regular aches and pains and more intense. Doesn't know why itch like something crawling over her and someone said it is the nerves and  said to take Vistaril. Therapist encouraged patient to seek help today and patient says that they brush it off or brush it off like mental health. Rates her pain at a 10. Will call doctor but nothing they will do. Will tell her to come in next week. If go to emergency room will make her sit there for 3-4 hours can sit home in pain. Doesn't want to sit in lobby with other people risk catching something. Tired of seeking help and not getting any. Nightmare and trying to shake it. Calm down from it. It lingered and making her heart beat funny. Breathing slow. Even when went to hospital they didn't do anything kept her in pain. Trying to move and body stiff. Will call triage nurse and if need to leave a message and try to get sleep.  Therapist assesses patient pain as something urgent to seek help for and encouraged her to call her doctor. Assess it is positive that patient agrees to call doctor and talk to the triage nurse to ask to get in today.  At the same time validating difficulty of navigating the health system.  Provided positive feedback for patient grounding herself through deep breathing from nightmare and talk to her about other types of grounding strategies all with the same purpose of helping her to distract, focusing outward on the external world rather than inward toward the self.  Reviewed there are different types of grounding such as mental grounding, physical grounding, soothing grounding.  Therapist provided active listening, open questions supportive interventions encouraging patient to seek help as symptoms seem to  have escalated with her medical issues currently.      Suicidal/Homicidal: No  Plan: Return again in 1 weeks.2.Marland Kitchenhelp cope with health issues, stress management  Diagnosis: Axis I: Major depressive disorder, recurrent, moderate, generalized anxiety disorder, chronic PTSD, panic attacks    Axis II: No diagnosis    [name], LCSW 03/03/2020

## 2020-03-08 ENCOUNTER — Ambulatory Visit (INDEPENDENT_AMBULATORY_CARE_PROVIDER_SITE_OTHER): Payer: Medicare Other | Admitting: Licensed Clinical Social Worker

## 2020-03-08 DIAGNOSIS — F4312 Post-traumatic stress disorder, chronic: Secondary | ICD-10-CM | POA: Diagnosis not present

## 2020-03-08 DIAGNOSIS — F410 Panic disorder [episodic paroxysmal anxiety] without agoraphobia: Secondary | ICD-10-CM | POA: Diagnosis not present

## 2020-03-08 DIAGNOSIS — J9620 Acute and chronic respiratory failure, unspecified whether with hypoxia or hypercapnia: Secondary | ICD-10-CM | POA: Diagnosis not present

## 2020-03-08 DIAGNOSIS — F411 Generalized anxiety disorder: Secondary | ICD-10-CM | POA: Diagnosis not present

## 2020-03-08 DIAGNOSIS — F331 Major depressive disorder, recurrent, moderate: Secondary | ICD-10-CM

## 2020-03-08 NOTE — Progress Notes (Signed)
Virtual Visit via Telephone Note  I connected with Lindsay [name] on 03/08/20 at  8:00 AM EST by telephone and verified that I am speaking with the correct person using two identifiers.  Location: Patient: home  Provider: home office   I discussed the limitations, risks, security and privacy concerns of performing an evaluation and management service by telephone and the availability of in person appointments. I also discussed with the patient that there may be a patient responsible charge related to this service. The patient expressed understanding and agreed to proceed.   I discussed the assessment and treatment plan with the patient. The patient was provided an opportunity to ask questions and all were answered. The patient agreed with the plan and demonstrated an understanding of the instructions.   The patient was advised to call back or seek an in-person evaluation if the symptoms worsen or if the condition fails to improve as anticipated.  I provided 45 minutes of non-face-to-face time during this encounter.  THERAPIST PROGRESS NOTE  Session Time: 8:00 AM to 8:45 AM  Participation Level: Active  Behavioral Response: CasualAlertDysphoric  Type of Therapy: Individual Therapy  Treatment Goals addressed:  help cope with health issues, anxiety, triggers, coping  Interventions: Solution Focused, Strength-based, Supportive and Other: coping  Summary: Lindsay [name] is a 50 y.o. female who presents with doctor didn't have anything available until this week. Doesn't want to go they do the same thing no matter how much pain she is in. They wanted to increase the Neurontin and it is not a nerve issue. They will tell her that they are not pain management that makes her mad. When she goes to her appointment, she is in a wheel chair, can't stand on scale and they still say can't do anything. Waste of time. Therapist explored with patient what she wants to do and she still has scheduled MRI.  Feels run around at pain clinic don't have records first time didn't and again don't have records. Sick and tired of it. Lay in pain every night, uncomfortable every night, tossing and turning makes "heart flip flop". Head feel dizzy don't get to sleep until 4-5 in morning then up 7-8. Can't stand up with DDD in her back, in her lower back, arthritis in shoulders and knees.Feel like they could do something about it. Wants to stand up do things like cook at least go to the bathroom. Nobody doing anything which makes her mad. If transportation go somewhere else. Lower back hurts every time she takes a breath. Doesn't want to go to ER doesn't have booster, had to sit there with mask does not want to put herself in misery and don't get help. Gets her mad but don't want to show that to them because don't want to give them the satisfaction of labeling her with mental health issues. That is why people turn to the street when in pain and not getting help.  Knows too risky to take drugs from the streets. Chronic heart disease on record since 2017 and nobody told her getting mad about it session but then calmed herself down knowing it wasn't good for her to get herself mad. Reviewed how therapy helps. Somebody hears and understands and doesn't feel like she is the crazy person. Therapist agrees and feels she does understand from being on this journey with patient, feels some of the frustrations patient not getting needs met for pain.  Therapist reviewed symptoms, facilitated expression of thoughts and feelings, utilize this as one  of the main interventions as patient continues to have pain issues. Continue to engage in problem solving with patient to address the issues and encourage her with a strategy to see a pain doctor so she will get some relief from pain. Validated patient on how she was feeling related to frustrations with health system and not getting the help that she needs particularly frustrating when she is  dealing with so much pain. Continue to provide hopeful interventions in that she will get some help once pain doctor sees her records and encouraged her to check with them to make sure they have her records. Therapist provided strength based, supportive active listening and open questions. Suicidal/Homicidal: No  Plan: Return again in 3 weeks.2,.help cope with health issues, stress management  Diagnosis: Axis I: Major depressive disorder, recurrent, moderate, generalized anxiety disorder, chronic PTSD, panic attacks    Axis II: No diagnosis    [name], LCSW 03/08/2020

## 2020-03-09 ENCOUNTER — Other Ambulatory Visit: Payer: Self-pay | Admitting: Pulmonary Disease

## 2020-03-09 NOTE — Addendum Note (Signed)
Addended by: Hulan Fray on: 03/09/2020 06:04 PM   Modules accepted: Orders

## 2020-03-13 ENCOUNTER — Other Ambulatory Visit (HOSPITAL_COMMUNITY): Payer: Self-pay | Admitting: Psychiatry

## 2020-03-13 ENCOUNTER — Encounter (HOSPITAL_COMMUNITY): Payer: Self-pay | Admitting: Psychiatry

## 2020-03-13 ENCOUNTER — Ambulatory Visit (HOSPITAL_COMMUNITY): Admission: RE | Admit: 2020-03-13 | Payer: Medicare Other | Source: Ambulatory Visit

## 2020-03-13 ENCOUNTER — Telehealth (INDEPENDENT_AMBULATORY_CARE_PROVIDER_SITE_OTHER): Payer: Medicare Other | Admitting: Psychiatry

## 2020-03-13 ENCOUNTER — Other Ambulatory Visit: Payer: Self-pay

## 2020-03-13 ENCOUNTER — Telehealth (HOSPITAL_COMMUNITY): Payer: Medicare Other | Admitting: Psychiatry

## 2020-03-13 DIAGNOSIS — F410 Panic disorder [episodic paroxysmal anxiety] without agoraphobia: Secondary | ICD-10-CM | POA: Diagnosis not present

## 2020-03-13 DIAGNOSIS — F4312 Post-traumatic stress disorder, chronic: Secondary | ICD-10-CM | POA: Diagnosis not present

## 2020-03-13 DIAGNOSIS — F331 Major depressive disorder, recurrent, moderate: Secondary | ICD-10-CM

## 2020-03-13 MED ORDER — LAMOTRIGINE 150 MG PO TABS: 150.0000 mg | ORAL_TABLET | Freq: Every day | ORAL | 0 refills | Status: DC

## 2020-03-13 MED ORDER — CLORAZEPATE DIPOTASSIUM 3.75 MG PO TABS: 3.7500 mg | ORAL_TABLET | Freq: Every day | ORAL | 2 refills | Status: DC

## 2020-03-13 MED ORDER — REXULTI 3 MG PO TABS: 3.0000 mg | ORAL_TABLET | Freq: Every day | ORAL | 2 refills | Status: DC

## 2020-03-13 MED ORDER — HYDROXYZINE PAMOATE 50 MG PO CAPS: 50.0000 mg | ORAL_CAPSULE | Freq: Every day | ORAL | 2 refills | Status: DC
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
## 2020-03-17 ENCOUNTER — Telehealth: Payer: Self-pay

## 2020-03-17 NOTE — Telephone Encounter (Signed)
RTC, patient states she is having trouble getting an at home Covid booster vaccine and was told she would need her PCP to call and schedule this.  RN called the At-home vaccination hotline at [phone].  Was informed patient meets criteria for in-home vaccination.  Informed RN there is a very high demand for vaccines now and approx 1 month back log.  Patient's information taken.  RN informed patient that she should be getting a call from At-home vaccination hotline to schedule appt and if she has any problems to call back and let [facility] know. SChaplin, RN,BSN

## 2020-03-17 NOTE — Telephone Encounter (Signed)
Requesting to speak with a nurse about getting COVID booster shot. Please call back.

## 2020-03-17 NOTE — Telephone Encounter (Signed)
Thank you Stacee 

## 2020-03-19 ENCOUNTER — Other Ambulatory Visit: Payer: Self-pay | Admitting: Internal Medicine

## 2020-03-19 DIAGNOSIS — M25511 Pain in right shoulder: Secondary | ICD-10-CM

## 2020-03-24 ENCOUNTER — Other Ambulatory Visit: Payer: Self-pay | Admitting: Obstetrics & Gynecology

## 2020-03-24 DIAGNOSIS — N921 Excessive and frequent menstruation with irregular cycle: Secondary | ICD-10-CM

## 2020-03-26 ENCOUNTER — Other Ambulatory Visit: Payer: Self-pay | Admitting: Internal Medicine

## 2020-03-26 DIAGNOSIS — E119 Type 2 diabetes mellitus without complications: Secondary | ICD-10-CM

## 2020-03-27 DIAGNOSIS — J9620 Acute and chronic respiratory failure, unspecified whether with hypoxia or hypercapnia: Secondary | ICD-10-CM | POA: Diagnosis not present

## 2020-03-28 DIAGNOSIS — G4733 Obstructive sleep apnea (adult) (pediatric): Secondary | ICD-10-CM | POA: Diagnosis not present

## 2020-03-29 DIAGNOSIS — M797 Fibromyalgia: Secondary | ICD-10-CM | POA: Diagnosis not present

## 2020-03-29 DIAGNOSIS — G894 Chronic pain syndrome: Secondary | ICD-10-CM | POA: Diagnosis not present

## 2020-03-29 DIAGNOSIS — M5459 Other low back pain: Secondary | ICD-10-CM | POA: Diagnosis not present

## 2020-03-29 DIAGNOSIS — G629 Polyneuropathy, unspecified: Secondary | ICD-10-CM | POA: Diagnosis not present

## 2020-03-31 ENCOUNTER — Ambulatory Visit (HOSPITAL_COMMUNITY)
Admission: RE | Admit: 2020-03-31 | Discharge: 2020-03-31 | Disposition: A | Discharge status: Home / Self Care | Payer: Medicare Other | Source: Ambulatory Visit | Attending: Internal Medicine | Admitting: Internal Medicine

## 2020-03-31 ENCOUNTER — Other Ambulatory Visit: Payer: Self-pay | Admitting: *Deleted

## 2020-03-31 ENCOUNTER — Ambulatory Visit (INDEPENDENT_AMBULATORY_CARE_PROVIDER_SITE_OTHER): Payer: Medicare Other | Admitting: Licensed Clinical Social Worker

## 2020-03-31 ENCOUNTER — Other Ambulatory Visit: Payer: Self-pay

## 2020-03-31 DIAGNOSIS — F4312 Post-traumatic stress disorder, chronic: Secondary | ICD-10-CM | POA: Diagnosis not present

## 2020-03-31 DIAGNOSIS — L732 Hidradenitis suppurativa: Secondary | ICD-10-CM

## 2020-03-31 DIAGNOSIS — E119 Type 2 diabetes mellitus without complications: Secondary | ICD-10-CM

## 2020-03-31 DIAGNOSIS — F411 Generalized anxiety disorder: Secondary | ICD-10-CM

## 2020-03-31 DIAGNOSIS — R42 Dizziness and giddiness: Secondary | ICD-10-CM | POA: Insufficient documentation

## 2020-03-31 DIAGNOSIS — M797 Fibromyalgia: Secondary | ICD-10-CM

## 2020-03-31 DIAGNOSIS — F331 Major depressive disorder, recurrent, moderate: Secondary | ICD-10-CM

## 2020-03-31 DIAGNOSIS — F410 Panic disorder [episodic paroxysmal anxiety] without agoraphobia: Secondary | ICD-10-CM | POA: Diagnosis not present

## 2020-03-31 IMAGING — MR MR HEAD W/O CM
12 of 17 series · 36 of 48 positions shown · non-contrast
Comparison: Prior head CT examinations [DATE] and earlier.

CLINICAL DATA: Dizziness. Dizziness, persistent/recurrent, cardiac
or vascular cause suspected

EXAM:
MRI HEAD WITHOUT CONTRAST
TECHNIQUE: Multiplanar, multiecho pulse sequences of the brain and surrounding
structures were obtained without intravenous contrast.

[Series 5: DWI · axial · 3.0mm · 0.88mm/px · z∈[-82,+91]mm · 6 of 118 slices shown (1 of 4)]
[im 1/118]
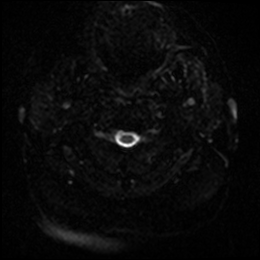
[im 24/118]
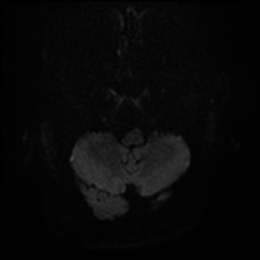
[im 47/118]
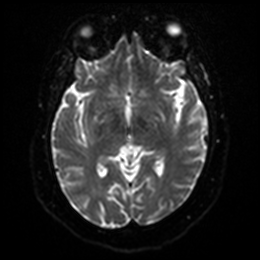
[im 71/118]
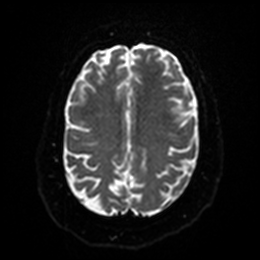
[im 94/118]
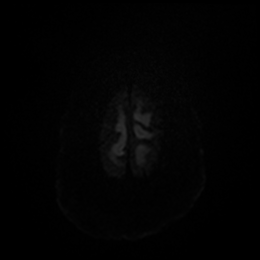
[im 118/118]
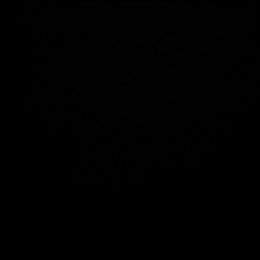

[Series 6: DWI · axial · 3.0mm · 0.88mm/px · z∈[-82,+85]mm · 3 of 57 slices shown (2 of 4)]
[im 1/57]
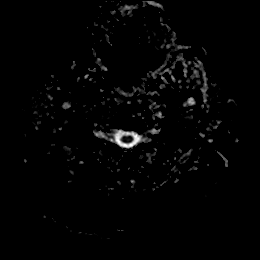
[im 29/57]
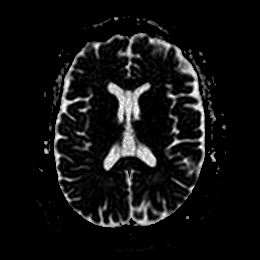
[im 57/57]
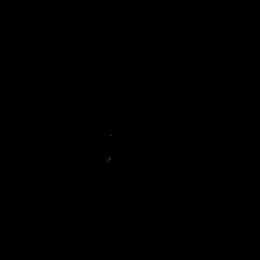

[Series 7: DWI · coronal · 4.0mm · 0.88mm/px · 5 of 86 slices shown (3 of 4)]
[im 1/86]
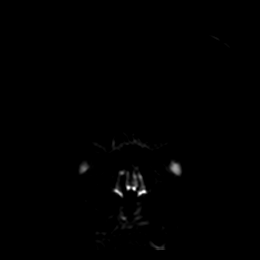
[im 22/86]
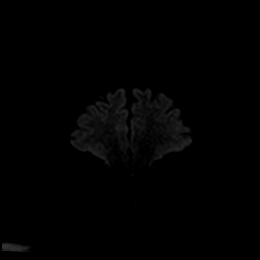
[im 43/86]
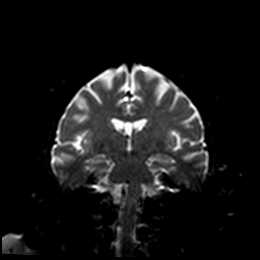
[im 64/86]
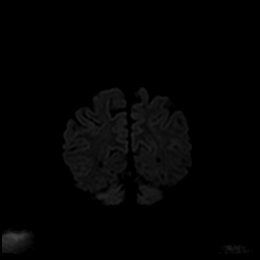
[im 86/86]
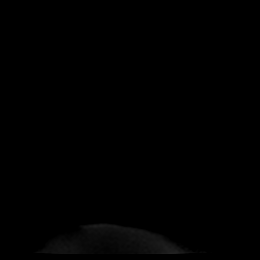

[Series 8: DWI · coronal · 4.0mm · 0.88mm/px · 2 of 43 slices shown (4 of 4)]
[im 1/43]
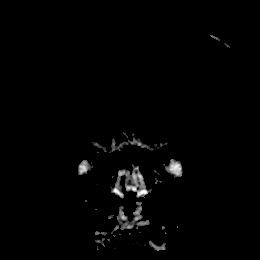
[im 43/43]
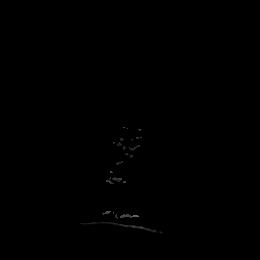

[Series 9: T1 · sagittal · 5.0mm · 0.75mm/px · 2 of 29 slices shown]
[im 1/29]
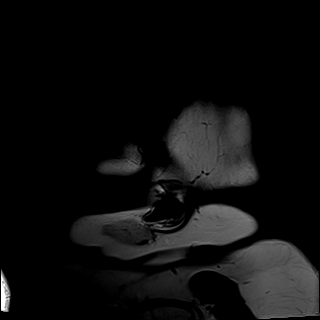
[im 29/29]
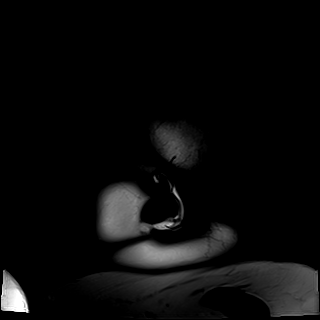

[Series 10: T2 · axial · 5.0mm · 0.72mm/px · z∈[-79,+88]mm · 2 of 29 slices shown (1 of 2)]
[im 1/29]
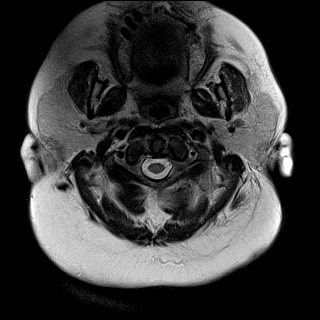
[im 29/29]
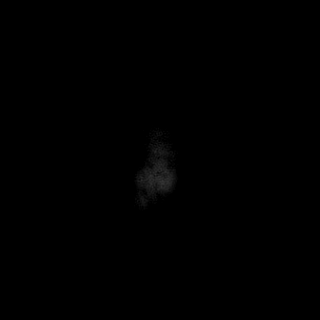

[Series 11: FLAIR · axial · 5.0mm · 0.45mm/px · z∈[-78,+89]mm · 2 of 29 slices shown]
[im 1/29]
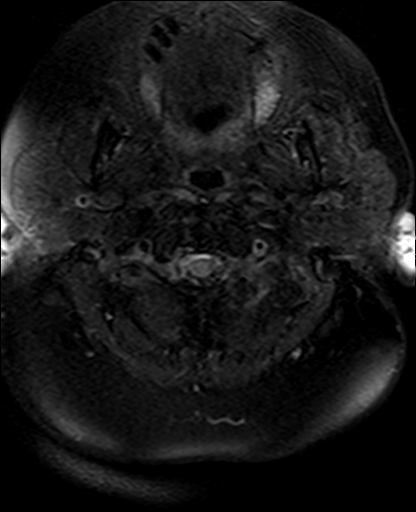
[im 29/29]
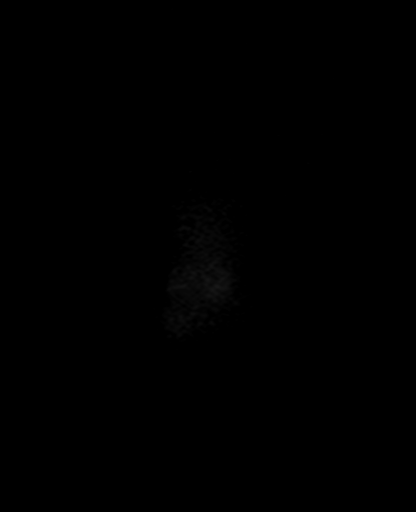

[Series 12: mag_images · axial · 3.0mm · 0.90mm/px · z∈[-82,+94]mm · 3 of 60 slices shown]
[im 1/60]
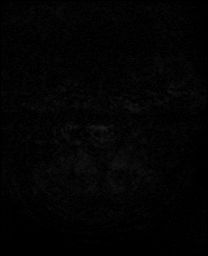
[im 30/60]
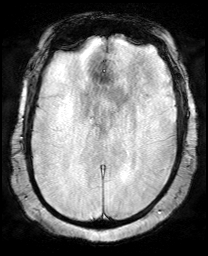
[im 60/60]
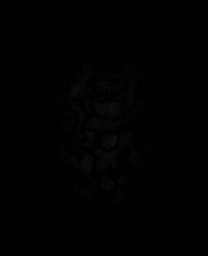

[Series 13: pha_images · axial · 3.0mm · 0.90mm/px · z∈[-79,+94]mm · 3 of 59 slices shown]
[im 1/59]
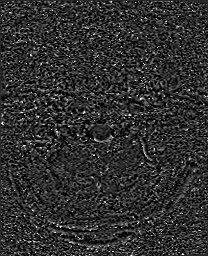
[im 30/59]
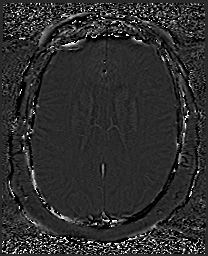
[im 59/59]
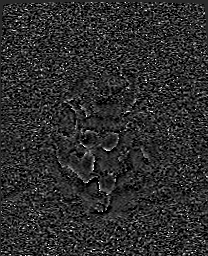

[Series 14: swi_images · axial · 3.0mm · 0.90mm/px · z∈[-82,+94]mm · 3 of 60 slices shown]
[im 1/60]
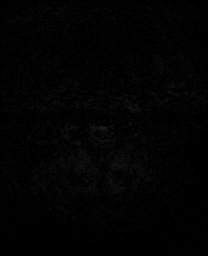
[im 30/60]
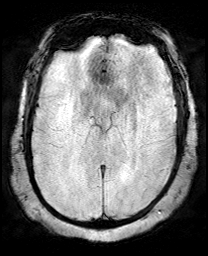
[im 60/60]
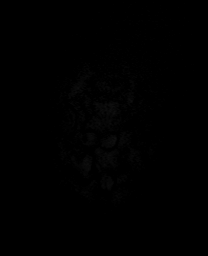

[Series 15: mip_images(sw) · axial · 24.0mm · 0.90mm/px · z∈[-72,+83]mm · 3 of 53 slices shown]
[im 1/53]
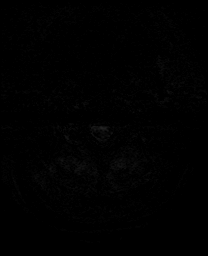
[im 27/53]
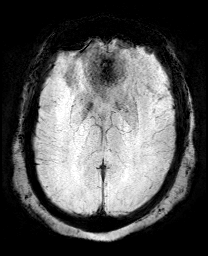
[im 53/53]
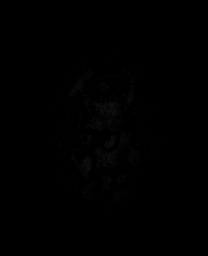

[Series 17: T2 · coronal · 5.0mm · 0.34mm/px · 2 of 34 slices shown (2 of 2)]
[im 1/34]
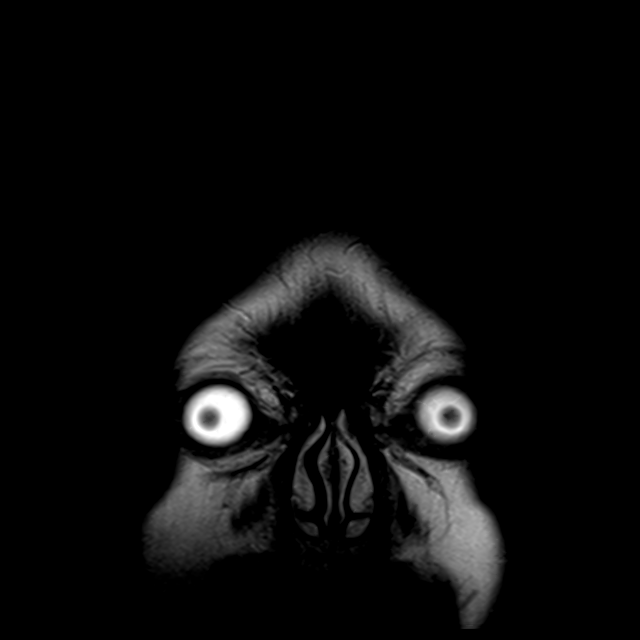
[im 34/34]
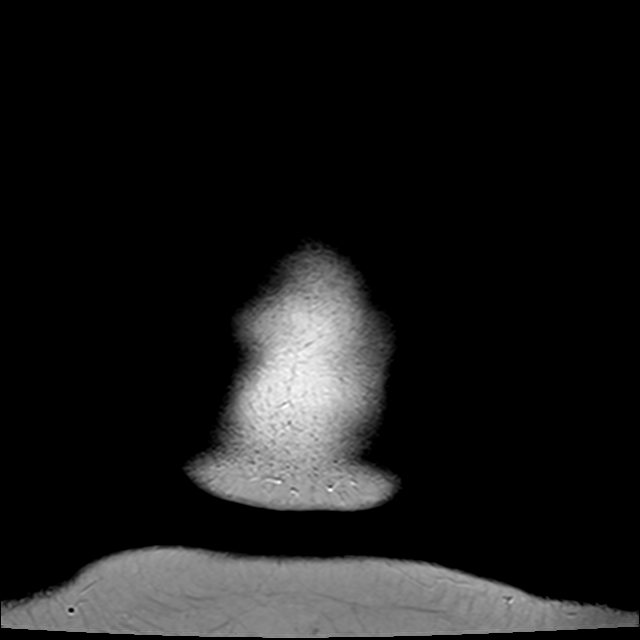

[36 of 48 positions shown; findings below may reference images not displayed]

FINDINGS: Brain:

Mild cerebral and cerebellar atrophy.

Subcentimeter chronic infarcts within the bilateral cerebellar
hemispheres.

No significant cerebral white matter disease.

There is no acute infarct.

No evidence of intracranial mass.

No chronic intracranial blood products.

No extra-axial fluid collection.

No midline shift.

Incidentally noted cavum septum pellucidum and cavum vergae.

Vascular: Expected proximal arterial flow voids.

Skull and upper cervical spine: No focal marrow lesion.

Sinuses/Orbits: Visualized orbits show no acute finding. No
significant paranasal sinus disease at the imaged levels.
IMPRESSION: No evidence of acute intracranial abnormality.

Subcentimeter chronic infarcts within the bilateral cerebellar
hemispheres.

Mild generalized parenchymal atrophy.

## 2020-03-31 NOTE — Telephone Encounter (Signed)
Call from pt stating she had MRI done and was reading the results; very concern. Pt concern if she had or having a stroke. Informed pt if she was having a stroke, radiologist would had called the doctor on call and would have been informed. Also it states "There is no acute infarct.". She wants her doctor to call her.    Also requesting refills on Lidocaine ( not on current med list ) and Clindamycin gel - states she's having a breakout under her right arm.
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
## 2020-04-02 ENCOUNTER — Other Ambulatory Visit: Payer: Self-pay | Admitting: Internal Medicine

## 2020-04-03 ENCOUNTER — Other Ambulatory Visit: Payer: Self-pay | Admitting: Internal Medicine

## 2020-04-03 DIAGNOSIS — E119 Type 2 diabetes mellitus without complications: Secondary | ICD-10-CM

## 2020-04-03 MED ORDER — CLINDAMYCIN PHOSPHATE 1 % EX GEL
CUTANEOUS | 0 refills | Status: DC
Start: 2020-04-03 — End: 2021-11-07

## 2020-04-03 MED ORDER — METFORMIN HCL ER (MOD) 1000 MG PO TB24: 1000.0000 mg | ORAL_TABLET | Freq: Every day | ORAL | 0 refills | Status: DC

## 2020-04-03 MED ORDER — LIDOCAINE 5 % EX OINT: 1.0000 "application " | TOPICAL_OINTMENT | CUTANEOUS | 0 refills | Status: DC | PRN

## 2020-04-03 NOTE — Telephone Encounter (Signed)
Spoke with patient about MRI results. She continues to have intermittent dizziness when she moves around. MRI showed subcentimeter chronic infarcts of her bilateral cerebellar hemispheres which could certainly be causing her symptoms. Explained this is not reversible but risk factor modification will be very important moving forward. Recently diagnosed with diabetes at her last visit with me on 02/03/20. She was started on metformin 500 mg XL daily. She had her annual [facility] visit on 03/27/20. Hgb A1c was rechecked, 7.3. She is agreeable to increasing metformin to 1,000 mg XL daily. She was also started on atorvastatin 40 during her visit with me in January due to elevated 10 year ASCVD risk of 14.2% and her new diagnosis of DM. LDL was 86. Will continue with this for now and repeat lipid panel at follow up.   I am referring her to neurology for her central vertigo and further work up of her cerebella infarcts if needed. Please have her schedule follow up with me again in 3 months.   I have also sent new prescription for metformin 1,000 mg ER to [facility] on [address]. Also refills for clindamycin gel for hidradenitis and lidocaine ointment for pain per request.

## 2020-04-04 ENCOUNTER — Other Ambulatory Visit: Payer: Self-pay | Admitting: Internal Medicine

## 2020-04-04 DIAGNOSIS — M25561 Pain in right knee: Secondary | ICD-10-CM

## 2020-04-04 MED ORDER — FAMOTIDINE 20 MG PO TABS: 20.0000 mg | ORAL_TABLET | Freq: Every day | ORAL | 1 refills | Status: DC

## 2020-04-04 NOTE — Telephone Encounter (Signed)
[place] office - please schedule pt a 3 month f/u with Dr Philipp Ovens  Thanks

## 2020-04-04 NOTE — Telephone Encounter (Signed)
The alternative they are requesting isn't covered or what I originally prescribed isn't covered? If the former I can send the 500 mg tablets which she was taking before. Thanks!

## 2020-04-04 NOTE — Telephone Encounter (Signed)
Thank you! Changed the prescription back to the 500 mg tablets, take 2 by mouth daily. Approved pepcid refill as well.

## 2020-04-05 DIAGNOSIS — J9620 Acute and chronic respiratory failure, unspecified whether with hypoxia or hypercapnia: Secondary | ICD-10-CM | POA: Diagnosis not present

## 2020-04-07 ENCOUNTER — Ambulatory Visit (INDEPENDENT_AMBULATORY_CARE_PROVIDER_SITE_OTHER): Payer: Medicare Other | Admitting: Licensed Clinical Social Worker

## 2020-04-07 ENCOUNTER — Other Ambulatory Visit: Payer: Self-pay | Admitting: Student

## 2020-04-07 DIAGNOSIS — F4312 Post-traumatic stress disorder, chronic: Secondary | ICD-10-CM | POA: Diagnosis not present

## 2020-04-07 DIAGNOSIS — I5023 Acute on chronic systolic (congestive) heart failure: Secondary | ICD-10-CM

## 2020-04-07 DIAGNOSIS — F410 Panic disorder [episodic paroxysmal anxiety] without agoraphobia: Secondary | ICD-10-CM | POA: Diagnosis not present

## 2020-04-07 DIAGNOSIS — F411 Generalized anxiety disorder: Secondary | ICD-10-CM | POA: Diagnosis not present

## 2020-04-07 DIAGNOSIS — F331 Major depressive disorder, recurrent, moderate: Secondary | ICD-10-CM | POA: Diagnosis not present
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
## 2020-04-13 ENCOUNTER — Telehealth: Payer: Self-pay | Admitting: Internal Medicine

## 2020-04-13 ENCOUNTER — Other Ambulatory Visit: Payer: Self-pay | Admitting: Gastroenterology

## 2020-04-13 DIAGNOSIS — Z8673 Personal history of transient ischemic attack (TIA), and cerebral infarction without residual deficits: Secondary | ICD-10-CM

## 2020-04-13 NOTE — Telephone Encounter (Signed)
Pt states she was to have a New Referral  Placed after having her MRI for a Neurologist. Please advise if a Referral was to be placed.

## 2020-04-14 ENCOUNTER — Ambulatory Visit (HOSPITAL_COMMUNITY): Payer: Medicare Other | Admitting: Licensed Clinical Social Worker

## 2020-04-14 DIAGNOSIS — F4312 Post-traumatic stress disorder, chronic: Secondary | ICD-10-CM

## 2020-04-14 DIAGNOSIS — F410 Panic disorder [episodic paroxysmal anxiety] without agoraphobia: Secondary | ICD-10-CM

## 2020-04-14 DIAGNOSIS — J069 Acute upper respiratory infection, unspecified: Secondary | ICD-10-CM | POA: Diagnosis not present

## 2020-04-14 DIAGNOSIS — F411 Generalized anxiety disorder: Secondary | ICD-10-CM

## 2020-04-14 DIAGNOSIS — F331 Major depressive disorder, recurrent, moderate: Secondary | ICD-10-CM

## 2020-04-14 NOTE — Telephone Encounter (Signed)
No Problem.  Thanks.

## 2020-04-14 NOTE — Progress Notes (Signed)
Therapist contacted patient by phone for session and patient shares she doesn't feel good. Nose stopped up think it is allergies. Ears stopped up as well. Her ears itch really bad. Had for about two days. Still got the pain. A lot of pain. Going to call pain clinic today and see if they have the records. Has to ask if want her to take EMG nerve conduction study. Called doctor yesterday because of nose and ears stopped up and won't get back until Monday. She started feeling hot and sweaty about 4 in the morning. Therapist suggested to end session early because patient not feeling well and patient agreed.

## 2020-04-14 NOTE — Telephone Encounter (Signed)
I apologize, I must have forgotten to place the order. She saw Dr. Floyde Parkins back in 2018. See my telephone note on 04/03/20. Thank you!

## 2020-04-15 DIAGNOSIS — G4733 Obstructive sleep apnea (adult) (pediatric): Secondary | ICD-10-CM | POA: Diagnosis not present

## 2020-04-19 ENCOUNTER — Encounter: Payer: Self-pay | Admitting: Internal Medicine

## 2020-04-19 ENCOUNTER — Ambulatory Visit (INDEPENDENT_AMBULATORY_CARE_PROVIDER_SITE_OTHER): Payer: Medicare Other | Admitting: Internal Medicine

## 2020-04-19 ENCOUNTER — Ambulatory Visit (HOSPITAL_COMMUNITY): Payer: Medicare Other | Admitting: Licensed Clinical Social Worker

## 2020-04-19 ENCOUNTER — Other Ambulatory Visit: Payer: Self-pay

## 2020-04-19 DIAGNOSIS — J069 Acute upper respiratory infection, unspecified: Secondary | ICD-10-CM | POA: Diagnosis not present

## 2020-04-19 NOTE — Progress Notes (Signed)
    This is a telephone encounter between [name] and Velna Ochs on 04/19/2020 for sinus congestion. The visit was conducted with the patient located at home and Velna Ochs at [facility]. The patient's identity was confirmed using their DOB and current address. The patient has consented to being evaluated through a telephone encounter and understands the associated risks (an examination cannot be done and the patient may need to come in for an appointment) / benefits (allows the patient to remain at home, decreasing exposure to coronavirus). I personally spent 10 minutes on medical discussion.   CC: Sinus congestion  HPI:  Ms.Thania Siverson is a 50 y.o. female with past medical history outlined below. Patient was contacted for a telehealth appointment for sinus congestion. For the details of today's visit, please refer to the assessment and plan.    Assessment & Plan:   See Encounters Tab for problem based charting.

## 2020-04-19 NOTE — Assessment & Plan Note (Signed)
Patient presents for telehealth visit for symptoms of nasal congestion and ear fullness x 1 week. She denies sore throat, fever, cough, chest tightness, wheezing, body aches, fevers, and chills. No sick contacts that she is aware of. She has been taking OTC cold medicine with phenylephrine. Advised her to avoid anything with a decongestant since this can raise her blood pressure. Advised Mucinex DM as needed instead, as well as daily neti pot and intranasal Flonase, 2 sprays each nostril after sinus irrigation. Suspect this is a viral URI and should resolve with supportive care. She knows to call back if she develops fevers or symptoms do not improve.

## 2020-04-20 ENCOUNTER — Other Ambulatory Visit: Payer: Self-pay | Admitting: Internal Medicine

## 2020-04-20 ENCOUNTER — Other Ambulatory Visit: Payer: Self-pay | Admitting: Obstetrics & Gynecology

## 2020-04-20 DIAGNOSIS — N921 Excessive and frequent menstruation with irregular cycle: Secondary | ICD-10-CM

## 2020-04-20 DIAGNOSIS — K219 Gastro-esophageal reflux disease without esophagitis: Secondary | ICD-10-CM

## 2020-04-24 DIAGNOSIS — J9620 Acute and chronic respiratory failure, unspecified whether with hypoxia or hypercapnia: Secondary | ICD-10-CM | POA: Diagnosis not present

## 2020-04-26 ENCOUNTER — Ambulatory Visit (INDEPENDENT_AMBULATORY_CARE_PROVIDER_SITE_OTHER): Payer: Medicare Other | Admitting: Licensed Clinical Social Worker

## 2020-04-26 DIAGNOSIS — F411 Generalized anxiety disorder: Secondary | ICD-10-CM | POA: Diagnosis not present

## 2020-04-26 DIAGNOSIS — F331 Major depressive disorder, recurrent, moderate: Secondary | ICD-10-CM | POA: Diagnosis not present

## 2020-04-26 DIAGNOSIS — F4312 Post-traumatic stress disorder, chronic: Secondary | ICD-10-CM

## 2020-04-26 DIAGNOSIS — F410 Panic disorder [episodic paroxysmal anxiety] without agoraphobia: Secondary | ICD-10-CM
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
## 2020-05-06 DIAGNOSIS — J9620 Acute and chronic respiratory failure, unspecified whether with hypoxia or hypercapnia: Secondary | ICD-10-CM | POA: Diagnosis not present

## 2020-05-12 ENCOUNTER — Ambulatory Visit (HOSPITAL_COMMUNITY): Payer: Medicare Other | Admitting: Licensed Clinical Social Worker

## 2020-05-18 ENCOUNTER — Encounter: Payer: Medicare Other | Admitting: Internal Medicine

## 2020-05-19 ENCOUNTER — Telehealth (HOSPITAL_COMMUNITY): Payer: Self-pay | Admitting: *Deleted

## 2020-05-19 ENCOUNTER — Other Ambulatory Visit (HOSPITAL_COMMUNITY): Payer: Self-pay | Admitting: *Deleted

## 2020-05-19 ENCOUNTER — Ambulatory Visit (INDEPENDENT_AMBULATORY_CARE_PROVIDER_SITE_OTHER): Payer: Medicare Other | Admitting: Licensed Clinical Social Worker

## 2020-05-19 ENCOUNTER — Other Ambulatory Visit: Payer: Self-pay

## 2020-05-19 DIAGNOSIS — F331 Major depressive disorder, recurrent, moderate: Secondary | ICD-10-CM | POA: Diagnosis not present

## 2020-05-19 DIAGNOSIS — F411 Generalized anxiety disorder: Secondary | ICD-10-CM | POA: Diagnosis not present

## 2020-05-19 DIAGNOSIS — F4312 Post-traumatic stress disorder, chronic: Secondary | ICD-10-CM | POA: Diagnosis not present

## 2020-05-19 DIAGNOSIS — F410 Panic disorder [episodic paroxysmal anxiety] without agoraphobia: Secondary | ICD-10-CM

## 2020-05-19 MED ORDER — AMITRIPTYLINE HCL 25 MG PO TABS: ORAL_TABLET | ORAL | 0 refills | Status: DC

## 2020-05-19 MED ORDER — AMITRIPTYLINE HCL 25 MG PO TABS: 150.0000 mg | ORAL_TABLET | Freq: Every day | ORAL | 0 refills | Status: DC

## 2020-05-19 NOTE — Telephone Encounter (Signed)
She can try amitriptyline 25 mg at bedtime but need to stop the hydroxyzine.  If she agree please call a 30-day supply until her next appointment.

## 2020-05-19 NOTE — Progress Notes (Signed)
Virtual Visit via Telephone Note  I connected with Lindsay [name] on 05/19/20 at  8:00 AM EDT by telephone and verified that I am speaking with the correct person using two identifiers.  Location: Patient: home Provider: home office   I discussed the limitations, risks, security and privacy concerns of performing an evaluation and management service by telephone and the availability of in person appointments. I also discussed with the patient that there may be a patient responsible charge related to this service. The patient expressed understanding and agreed to proceed.   I discussed the assessment and treatment plan with the patient. The patient was provided an opportunity to ask questions and all were answered. The patient agreed with the plan and demonstrated an understanding of the instructions.   The patient was advised to call back or seek an in-person evaluation if the symptoms worsen or if the condition fails to improve as anticipated.  I provided 45 minutes of non-face-to-face time during this encounter.  THERAPIST PROGRESS NOTE  Session Time: 8:00 AM to 8:45 AM  Participation Level: Active  Behavioral Response: CasualAlertDepressed  Type of Therapy: Individual Therapy  Treatment Goals addressed:  help cope with health issues, anxiety, triggers, coping  Interventions: Solution Focused, Strength-based, Supportive, Reframing and Other: coping  Summary: Lindsay [name] is a 50 y.o. female who presents with been in pain. Not going to go back to [facility]. Asked to be sent to another clinic. Was to have a EMG nerve conduction test through pain clinic and but didn't follow through with that. Didn't want to go because of how acting didn't know what else they could do to her. Had MRI and said several strokes. Remembers one in Nov 20 2019 when she fell, in so much pain and then found out heart failure also and Jan 2-4, 2022 also having strokes. Has to see neurologist. If  not so much pain could move around hard to get up on left knee has to bend over because of lower back. Her life is staying in the bed. Pointed out will add to depression and says already kicking in. Going to ask doctor for amitriptyline and discussed moving around would help. Hard to even go outside. Has to be really careful because of pollen level. People with respiratory issues can't be out in those conditions. PCP won't give her anything for short-term for her pain have to find somebody because they won't. Going to call [place] today to [employer] of agencies that accepts her insurance, to find somebody who can give her the help she needs in home. Mom checks in and sees what she needs. Mom does that a lot. Talks to sister, mom, daughter who is with new boyfriend. "She is showing her ass." don't think she should be head over heals in two months patient doesn't think she would be like that in two years, don't trust people never know. Family trusts and niece. Uncle in [place]. Sister comes early in the morning and talks to mom and patient. Two other sisters. She thinks 37 and 60. Gets along pretty well. All three sisters talk to mom every day on the phone. Shares incident where trying to call daughter not answering, arguing with him, mouth reckless, hit the wall. Patient got up and pounded on her door. Told her arguing with man go outside not going to tear up this house.  In addressing stressors explored what could be helpful for patient and she related prior. Therapist noted her own experience  were God shows up for [name] and patient also sees it.  Her health could be a lot worse. If wasn't for God could be worse. Could not be here. Doesn't have any plans for weekend, find some movies to watch. Therapist points out that it helps to get mind off of things. Reviewed patient struggle to get help she needs she gets there and then they say can't help you how many times that has happened and it is frustrating.    Therapist reviewed symptoms, facilitated expression of thoughts and feelings as an important intervention to help patient in coping with her stressors including things like medical issues, issues with her daughter.  Validated patient with her struggles and frustrations not getting answers for pain issues.  Also continue to encourage her to look for treatment to get the help she needs.  Noted trauma symptoms of lack of trust on therapist explained it is a very common symptom of trauma that one's safety has been threatened so that walls come up.  Noted value of relationships and life although needing to be cautious with people, noted her relationships with family members as positive relationships for her.  Reviewed coping with her difficulties including watching shows, spirituality, noting watching shows help to get her mind off of things that can be helpful.  Noted the supports she has is helpful and close family network.  Therapist provided active listening, open questions, supportive interventions.  Noted patient's strength in continuing to problem solve around her issues  Suicidal/Homicidal: No  Plan: Return again in 1 week.2.  Find a new coping strategy for patient consider meditation such as compassion meditation that has a spiritual component.  Review shows watched as this is very helpful for patient's coping. Look at mindfulness 3.  Continue to work with patient on processing feelings, coping with stressors  Diagnosis: Axis I:  Major depressive disorder, recurrent, moderate, generalized anxiety disorder, chronic PTSD, panic attacks    Axis II: No diagnosis    [name], LCSW 05/19/2020

## 2020-05-19 NOTE — Telephone Encounter (Signed)
Pt called with c/o pain and stated that you and she had discussed possibly starting Elavil to help with pain management. Pt says she is ready to try it. Pt next appointment is on 06/08/20. Please review.

## 2020-05-19 NOTE — Telephone Encounter (Signed)
VM left for pt

## 2020-05-22 ENCOUNTER — Other Ambulatory Visit: Payer: Self-pay | Admitting: Gastroenterology

## 2020-05-25 DIAGNOSIS — J9620 Acute and chronic respiratory failure, unspecified whether with hypoxia or hypercapnia: Secondary | ICD-10-CM | POA: Diagnosis not present

## 2020-05-26 ENCOUNTER — Ambulatory Visit (INDEPENDENT_AMBULATORY_CARE_PROVIDER_SITE_OTHER): Payer: Medicare Other | Admitting: Licensed Clinical Social Worker

## 2020-05-26 DIAGNOSIS — F331 Major depressive disorder, recurrent, moderate: Secondary | ICD-10-CM | POA: Diagnosis not present

## 2020-05-26 DIAGNOSIS — F410 Panic disorder [episodic paroxysmal anxiety] without agoraphobia: Secondary | ICD-10-CM

## 2020-05-26 DIAGNOSIS — F4312 Post-traumatic stress disorder, chronic: Secondary | ICD-10-CM | POA: Diagnosis not present

## 2020-05-26 DIAGNOSIS — F411 Generalized anxiety disorder: Secondary | ICD-10-CM

## 2020-05-26 NOTE — Progress Notes (Signed)
Virtual Visit via Telephone Note  I connected with [name] on 05/26/20 at  8:00 AM EDT by telephone and verified that I am speaking with the correct person using two identifiers.  Location: Patient: home Provider: home office   I discussed the limitations, risks, security and privacy concerns of performing an evaluation and management service by telephone and the availability of in person appointments. I also discussed with the patient that there may be a patient responsible charge related to this service. The patient expressed understanding and agreed to proceed.   I discussed the assessment and treatment plan with the patient. The patient was provided an opportunity to ask questions and all were answered. The patient agreed with the plan and demonstrated an understanding of the instructions.   The patient was advised to call back or seek an in-person evaluation if the symptoms worsen or if the condition fails to improve as anticipated.  I provided 20 minutes of non-face-to-face time during this encounter.  THERAPIST PROGRESS NOTE  Session Time: 8:00 AM to 8:20 AM  Participation Level: Active  Behavioral Response: CasualAlertDysphoric  Type of Therapy: Individual Therapy  Treatment Goals addressed:  help cope with health issues, anxiety, triggers, coping  Interventions: Solution Focused, Strength-based, Supportive and Other: grief, coping  Summary: Lindsay [name] is a 50 y.o. female who presents with check in and said hurting, also dealing with two family members (neighbors but like family), friend who had a son jumped in [place] at 2, a lady who died of [place] procedure grew up with her haven't seen for awhile. Discussed how she feels and she says really sad pregnant at the same time as his mom. Known him all his life. Talked about him that he was at [employer], a football star. Daughter taking it hard grew up with him. Pay her respects may be the best she can do may  be overwhelming for her. So much pain lately connect her to [facility], also Dr. Adele Schilder prescribed amitriptyline talked about how the drugstore said take 6 at a time, checked with Dr.'s office and was only supposed to take 1 says cannot even trust drugstores.  Says pain all the time lately.  Going today to the heart doctor so had to go to appointment and only had short session.  Therapist reviewed symptoms, facilitated expression of thoughts and feelings, process patient's feelings specifically related to grief as a coping mechanism for grief.  Validated patient on how she was feeling related to the losses.  Noted patient going to the doctor as present and given significance of her medical issues right now.  Therapist provided active listening, open questions, supportive interventions..   Suicidal/Homicidal: No  Plan: Return again in 1week.2.  Introduced Forensic scientist, Coping skills sheet To introduce more coping skills, continue to process patient's feelings around stressors, introduce coping skills helpful for patient's issues. 3.Review treatment plan  Diagnosis: Axis I: Major depressive disorder, recurrent, moderate, generalized anxiety disorder, chronic PTSD, panic attacks    Axis II: No diagnosis    [name], LCSW 05/26/2020

## 2020-06-02 ENCOUNTER — Ambulatory Visit (INDEPENDENT_AMBULATORY_CARE_PROVIDER_SITE_OTHER): Payer: Medicare Other | Admitting: Licensed Clinical Social Worker

## 2020-06-02 DIAGNOSIS — F410 Panic disorder [episodic paroxysmal anxiety] without agoraphobia: Secondary | ICD-10-CM

## 2020-06-02 DIAGNOSIS — F411 Generalized anxiety disorder: Secondary | ICD-10-CM | POA: Diagnosis not present

## 2020-06-02 DIAGNOSIS — F4312 Post-traumatic stress disorder, chronic: Secondary | ICD-10-CM

## 2020-06-02 DIAGNOSIS — F331 Major depressive disorder, recurrent, moderate: Secondary | ICD-10-CM | POA: Diagnosis not present

## 2020-06-02 NOTE — Progress Notes (Signed)
Virtual Visit via Telephone Note  I connected with [name] on 06/02/20 at  8:00 AM EDT by telephone and verified that I am speaking with the correct person using two identifiers.  Location: Patient: home Provider: home office   I discussed the limitations, risks, security and privacy concerns of performing an evaluation and management service by telephone and the availability of in person appointments. I also discussed with the patient that there may be a patient responsible charge related to this service. The patient expressed understanding and agreed to proceed.   I discussed the assessment and treatment plan with the patient. The patient was provided an opportunity to ask questions and all were answered. The patient agreed with the plan and demonstrated an understanding of the instructions.   The patient was advised to call back or seek an in-person evaluation if the symptoms worsen or if the condition fails to improve as anticipated.  I provided 25 minutes of non-face-to-face time during this encounter.  THERAPIST PROGRESS NOTE  Session Time: 8:00 AM to 8:25 AM  Participation Level: Active  Behavioral Response: CasualAlertDysphoric  Type of Therapy: Individual Therapy  Treatment Goals addressed:  help cope with health issues, anxiety, triggers, coping, utilize therapy as a way for patient to focus on working through current stressors that also helps to distract from pain.  Interventions: CBT, Solution Focused, Strength-based, Supportive and Other: coping  Summary: Lindsay [name] is a 50 y.o. female who presents with out of breath right now. Had just been moving around. Noted her pain issues are even worse. She needs to call [facility] to get a referral to [facility].  Therapist completed treatment plan with patient and she gave verbal consent to complete virtually.  Noted additional benefit of therapy that serves as a distractor from her pain  issues at the same time focusing on things that are on her mind that she needs to work through.  Therapist provided education to patient while doing treatment plan of how we are using CBT strategies in sessions.  Noted one of the short term goals is to denies how are thinking, behaviors and physiology all contribute to anxiety so in sessions we look at addressing these different aspects to help decrease anxiety.  For example noting when her thoughts are inaccurate, looking at things from a different perspective that we is the strategies in therapy. Today is mom's birthday today and "trying not to whine and cry". She is 24 years old. Her brother who is patient's uncle is supposed to have something at patient's house. If rain then at his house. Therapist guided patient as the party will help her get her mind off of things. Doesn't want to go in pain, out of breath, has to take the oxygen tank. Hurts to move around.  Not sure still have a big enough chair for her. Her leg is hurting tried amitriptyline not helpful.  Patient would like to end session earlier so she can call her doctor to get referral.  Assess this is a priority patient has been dealing with pain issues long enough so important for her to get referral. Managing pain issues will have a significant impact on her mental health.  In session also introduced compassion meditation discussed doing meditation really can have a positive impact on mental health.  Introduced so patient begins to have tools available that will help with mental health symptoms     Suicidal/Homicidal: No  Plan: Return again in 4 weeks.2.  Look at coping skills list downloaded to continue to give patient tolls to help with coping. 3.  Processed patient's feelings related to current issues, help patient with coping  Diagnosis: Axis I: Major depressive disorder, recurrent, moderate, generalized anxiety disorder, chronic PTSD, panic attacks    Axis II: No diagnosis    Lindsay Register, LCSW 06/02/2020

## 2020-06-03 ENCOUNTER — Other Ambulatory Visit: Payer: Self-pay | Admitting: Internal Medicine

## 2020-06-03 DIAGNOSIS — M25561 Pain in right knee: Secondary | ICD-10-CM

## 2020-06-05 ENCOUNTER — Telehealth: Payer: Self-pay | Admitting: Internal Medicine

## 2020-06-05 DIAGNOSIS — J449 Chronic obstructive pulmonary disease, unspecified: Secondary | ICD-10-CM | POA: Diagnosis not present

## 2020-06-05 DIAGNOSIS — J9610 Chronic respiratory failure, unspecified whether with hypoxia or hypercapnia: Secondary | ICD-10-CM | POA: Diagnosis not present

## 2020-06-05 DIAGNOSIS — E785 Hyperlipidemia, unspecified: Secondary | ICD-10-CM | POA: Diagnosis not present

## 2020-06-05 DIAGNOSIS — I5040 Unspecified combined systolic (congestive) and diastolic (congestive) heart failure: Secondary | ICD-10-CM | POA: Diagnosis not present

## 2020-06-05 DIAGNOSIS — N189 Chronic kidney disease, unspecified: Secondary | ICD-10-CM | POA: Diagnosis not present

## 2020-06-05 DIAGNOSIS — I10 Essential (primary) hypertension: Secondary | ICD-10-CM | POA: Diagnosis not present

## 2020-06-05 DIAGNOSIS — E119 Type 2 diabetes mellitus without complications: Secondary | ICD-10-CM | POA: Diagnosis not present

## 2020-06-05 DIAGNOSIS — J9620 Acute and chronic respiratory failure, unspecified whether with hypoxia or hypercapnia: Secondary | ICD-10-CM | POA: Diagnosis not present

## 2020-06-05 NOTE — Telephone Encounter (Signed)
We cannot continue to refer her to different pain management doctors. Please ask her to have records sent over and follow up with Dr. Vira Blanco.

## 2020-06-05 NOTE — Telephone Encounter (Signed)
This patient is now requesting to see another Pain Management Dr.  Abbott Pao states the Dr. Was unable to truly assess her because they had not rec'd Records from [place].  Called and spoke with the Pain Mang office (Dr. Jodene Nam office). Verified records were never sent to their office about her Imaging (knee) and Emg performed. Last ov notes that were sent from [facility] and [facility] were from December.  Patient does not care to go back.  I have sch a follow up appt with you for 06/28/2020 as she instructed but the patient is now wanting to know if she can go to a different office or wait until her next appointment with you.  Please advise.

## 2020-06-06 NOTE — Telephone Encounter (Signed)
Thank you so much. Explained to her she has been Referred to 3 Dr.'s already and she needs to follow up with Dr. Vira Blanco per his office notes..  Per Dr. Jodene Nam office she has only been seen one time and those records are already scanned into Epic.

## 2020-06-06 NOTE — Telephone Encounter (Signed)
Thank you Chilon.  

## 2020-06-08 ENCOUNTER — Other Ambulatory Visit: Payer: Self-pay

## 2020-06-08 ENCOUNTER — Other Ambulatory Visit: Payer: Self-pay | Admitting: Pulmonary Disease

## 2020-06-08 ENCOUNTER — Other Ambulatory Visit (HOSPITAL_COMMUNITY): Payer: Self-pay | Admitting: Psychiatry

## 2020-06-08 ENCOUNTER — Telehealth (INDEPENDENT_AMBULATORY_CARE_PROVIDER_SITE_OTHER): Payer: Medicare Other | Admitting: Psychiatry

## 2020-06-08 ENCOUNTER — Encounter (HOSPITAL_COMMUNITY): Payer: Self-pay | Admitting: Psychiatry

## 2020-06-08 VITALS — Wt 393.0 lb

## 2020-06-08 DIAGNOSIS — F331 Major depressive disorder, recurrent, moderate: Secondary | ICD-10-CM

## 2020-06-08 DIAGNOSIS — F4312 Post-traumatic stress disorder, chronic: Secondary | ICD-10-CM

## 2020-06-08 DIAGNOSIS — F410 Panic disorder [episodic paroxysmal anxiety] without agoraphobia: Secondary | ICD-10-CM

## 2020-06-08 MED ORDER — LAMOTRIGINE 150 MG PO TABS: 150.0000 mg | ORAL_TABLET | Freq: Every day | ORAL | 0 refills | Status: DC

## 2020-06-08 MED ORDER — REXULTI 3 MG PO TABS: 3.0000 mg | ORAL_TABLET | Freq: Every day | ORAL | 2 refills | Status: DC

## 2020-06-08 MED ORDER — CLORAZEPATE DIPOTASSIUM 3.75 MG PO TABS: 3.7500 mg | ORAL_TABLET | Freq: Every day | ORAL | 2 refills | Status: DC

## 2020-06-08 MED ORDER — HYDROXYZINE PAMOATE 50 MG PO CAPS: 50.0000 mg | ORAL_CAPSULE | Freq: Every evening | ORAL | 2 refills | Status: DC | PRN
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
## 2020-06-15 ENCOUNTER — Other Ambulatory Visit (HOSPITAL_COMMUNITY): Payer: Self-pay | Admitting: Psychiatry

## 2020-06-20 DIAGNOSIS — G4733 Obstructive sleep apnea (adult) (pediatric): Secondary | ICD-10-CM | POA: Diagnosis not present

## 2020-06-23 ENCOUNTER — Ambulatory Visit: Payer: Self-pay | Admitting: Neurology

## 2020-06-23 ENCOUNTER — Other Ambulatory Visit: Payer: Self-pay | Admitting: Gastroenterology

## 2020-06-23 ENCOUNTER — Telehealth: Payer: Self-pay | Admitting: Neurology

## 2020-06-23 NOTE — Telephone Encounter (Signed)
This patient canceled the same day of a new patient appointment.

## 2020-06-24 DIAGNOSIS — J9620 Acute and chronic respiratory failure, unspecified whether with hypoxia or hypercapnia: Secondary | ICD-10-CM | POA: Diagnosis not present

## 2020-06-27 DIAGNOSIS — M1710 Unilateral primary osteoarthritis, unspecified knee: Secondary | ICD-10-CM | POA: Diagnosis not present

## 2020-06-27 NOTE — Telephone Encounter (Signed)
Patient is requesting refill on promethazine.  She has not been seen since October 2020.  Is it OK to refill?

## 2020-06-28 ENCOUNTER — Encounter: Payer: Medicare Other | Admitting: Internal Medicine

## 2020-06-28 ENCOUNTER — Other Ambulatory Visit: Payer: Self-pay

## 2020-06-28 ENCOUNTER — Ambulatory Visit (INDEPENDENT_AMBULATORY_CARE_PROVIDER_SITE_OTHER): Payer: Medicare Other | Admitting: Licensed Clinical Social Worker

## 2020-06-28 DIAGNOSIS — R059 Cough, unspecified: Secondary | ICD-10-CM

## 2020-06-28 DIAGNOSIS — F411 Generalized anxiety disorder: Secondary | ICD-10-CM | POA: Diagnosis not present

## 2020-06-28 DIAGNOSIS — F410 Panic disorder [episodic paroxysmal anxiety] without agoraphobia: Secondary | ICD-10-CM

## 2020-06-28 DIAGNOSIS — F4312 Post-traumatic stress disorder, chronic: Secondary | ICD-10-CM

## 2020-06-28 DIAGNOSIS — F331 Major depressive disorder, recurrent, moderate: Secondary | ICD-10-CM | POA: Diagnosis not present

## 2020-06-28 MED ORDER — SYMBICORT 160-4.5 MCG/ACT IN AERO: 2.0000 | INHALATION_SPRAY | Freq: Two times a day (BID) | RESPIRATORY_TRACT | 0 refills | Status: DC

## 2020-06-28 NOTE — Progress Notes (Signed)
Refill

## 2020-06-28 NOTE — Progress Notes (Signed)
Virtual Visit via Telephone Note  I connected with Lindsay [name] on 06/28/20 at  8:00 AM EDT by telephone and verified that I am speaking with the correct person using two identifiers.  Location: Patient: home Provider: home office   I discussed the limitations, risks, security and privacy concerns of performing an evaluation and management service by telephone and the availability of in person appointments. I also discussed with the patient that there may be a patient responsible charge related to this service. The patient expressed understanding and agreed to proceed.   I discussed the assessment and treatment plan with the patient. The patient was provided an opportunity to ask questions and all were answered. The patient agreed with the plan and demonstrated an understanding of the instructions.   The patient was advised to call back or seek an in-person evaluation if the symptoms worsen or if the condition fails to improve as anticipated.  I provided 53 minutes of non-face-to-face time during this encounter.  THERAPIST PROGRESS NOTE  Session Time: 8:00 AM to 8:53 AM  Participation Level: Active  Behavioral Response: CasualAlertAnxious and Dysphoric  Type of Therapy: Individual Therapy  Treatment Goals addressed:  help cope with health issues, anxiety, triggers, coping, utilize therapy as a way for patient to focus on working through current stressors that also helps to distract from pain. Interventions: Solution Focused, Strength-based, Supportive, Reframing and Other: coping  Summary: Lindsay [name] is a 50 y.o. female who presents with sleep problems either stay up all night, or an up and down all. Go to sleep right before it is light. Knows it is not good. A lot of it is pain, also with bed spring coming through the mattress so think going to ask for medical bed. Feels like that is giving up and therapist reframed. (see below) Feels like never go to sleep and stay awake thoughts  of what has to so, what is going on in mind. Describes feeling being overwhelmed. Daughter acting up to the point 2-3 Sundays ago told her that she has to go. Yelling and screaming in the house with new boyfriend. He is 14 and doesn't work thinks he will bring her down. Whatever she is doing people complain about it and has to hear it from mom, sister and niece. Feels torn about her going. It is about petty stuff. That is what keeps her up worrying about her daughter. Try to talk to her and won't listen. Still in pain tried to get a referral the last place violated HIPPA, other hung up her PCP and rude to her. Right leg hurting up thigh where it connects to body. Injections in knees. It hurts especially the right ones. Doctor office won't make referral after two made, patient said not fair they did it to her. Therapist pointed out we often talk about problems health care patient and patient agrees. Why would she would be making this up not worth her time to make it up. No other choice but go back to the one who was rude. Heart doctor asked her to get weight loss surgery. There is risk but doctor think is beneficial benefits more than the risks. Thinks help to lose weight if don't drink soda hang up of hers since stopped smoking cigarettes. Been to a nutritionist 2-3 times. Lasted for a year each time. Familiar with food pyramid. Doesn't eat what not supposed to eat house is full of diabetes and high blood pressure and heart failure. Hasn't had a soda in 2-3  days. Mom's birthday was great people from [place]. Explored positive. Patient did not have any therapist made suggestions   Therapist reviewed symptoms, facilitated expression of thoughts and feelings identified underlying sources of sleep issues include pain and anxiety about her daughter.  Processed patient's feelings related to stressors with her daughter to help with her coping.  Validated patient on how she was feeling at the same time looking at it from  perspective patient's daughter still young when younger not always making the best choices reinforced patient trying to guide her.  Reframed issue around medical bed and pointed out to patient not a negative thing and will make it easier on her help her be more comfortable which is a positive thing.  Noted significant stressors healthcare system and validated patient on negative experiences with this noting needs providers who will listen and give her the help she needs and does not get that all the time.  Explored positive things for patient and when she could not identified any noted little things can be positive that she can do for herself and provided suggestions like cooking a meal that she enjoys, growling.  Therapist provided active listening, open questions, supportive interventions Suicidal/Homicidal: No  Plan: Return again in 1 week.2.  Reviewed downloaded sheet on coping skills, processed patient's feelings, coping  Diagnosis: Axis I: Major depressive disorder, recurrent, moderate, generalized anxiety disorder, chronic PTSD, panic attacks    Axis II: No diagnosis    [name], LCSW 06/28/2020

## 2020-06-29 ENCOUNTER — Other Ambulatory Visit (HOSPITAL_COMMUNITY): Payer: Self-pay | Admitting: Psychiatry

## 2020-06-29 ENCOUNTER — Other Ambulatory Visit: Payer: Self-pay | Admitting: Internal Medicine

## 2020-07-01 ENCOUNTER — Emergency Department (HOSPITAL_COMMUNITY): Payer: Medicare Other

## 2020-07-01 ENCOUNTER — Emergency Department (HOSPITAL_COMMUNITY)
Admission: EM | Admit: 2020-07-01 | Discharge: 2020-07-01 | Disposition: A | Discharge status: Home / Self Care | Payer: Medicare Other | Attending: Emergency Medicine | Admitting: Emergency Medicine

## 2020-07-01 ENCOUNTER — Other Ambulatory Visit: Payer: Self-pay

## 2020-07-01 ENCOUNTER — Encounter (HOSPITAL_COMMUNITY): Payer: Self-pay | Admitting: Emergency Medicine

## 2020-07-01 DIAGNOSIS — I110 Hypertensive heart disease with heart failure: Secondary | ICD-10-CM | POA: Diagnosis not present

## 2020-07-01 DIAGNOSIS — R6889 Other general symptoms and signs: Secondary | ICD-10-CM | POA: Diagnosis not present

## 2020-07-01 DIAGNOSIS — R519 Headache, unspecified: Secondary | ICD-10-CM | POA: Diagnosis not present

## 2020-07-01 DIAGNOSIS — Z87891 Personal history of nicotine dependence: Secondary | ICD-10-CM | POA: Diagnosis not present

## 2020-07-01 DIAGNOSIS — I10 Essential (primary) hypertension: Secondary | ICD-10-CM | POA: Diagnosis not present

## 2020-07-01 DIAGNOSIS — I509 Heart failure, unspecified: Secondary | ICD-10-CM | POA: Diagnosis not present

## 2020-07-01 DIAGNOSIS — Z79899 Other long term (current) drug therapy: Secondary | ICD-10-CM | POA: Diagnosis not present

## 2020-07-01 DIAGNOSIS — H8110 Benign paroxysmal vertigo, unspecified ear: Secondary | ICD-10-CM | POA: Insufficient documentation

## 2020-07-01 DIAGNOSIS — Z743 Need for continuous supervision: Secondary | ICD-10-CM | POA: Diagnosis not present

## 2020-07-01 DIAGNOSIS — I499 Cardiac arrhythmia, unspecified: Secondary | ICD-10-CM | POA: Diagnosis not present

## 2020-07-01 DIAGNOSIS — Z7984 Long term (current) use of oral hypoglycemic drugs: Secondary | ICD-10-CM | POA: Diagnosis not present

## 2020-07-01 DIAGNOSIS — R42 Dizziness and giddiness: Secondary | ICD-10-CM | POA: Diagnosis not present

## 2020-07-01 DIAGNOSIS — E119 Type 2 diabetes mellitus without complications: Secondary | ICD-10-CM | POA: Diagnosis not present

## 2020-07-01 DIAGNOSIS — R29810 Facial weakness: Secondary | ICD-10-CM | POA: Diagnosis not present

## 2020-07-01 DIAGNOSIS — R404 Transient alteration of awareness: Secondary | ICD-10-CM | POA: Diagnosis not present

## 2020-07-01 LAB — URINALYSIS, ROUTINE W REFLEX MICROSCOPIC
Bilirubin Urine: NEGATIVE
Glucose, UA: NEGATIVE mg/dL
Hgb urine dipstick: NEGATIVE
Ketones, ur: NEGATIVE mg/dL
Leukocytes,Ua: NEGATIVE
Nitrite: NEGATIVE
Protein, ur: 100 mg/dL — AB
Specific Gravity, Urine: 1.033 — ABNORMAL HIGH (ref 1.005–1.030)
pH: 5 (ref 5.0–8.0)

## 2020-07-01 LAB — DIFFERENTIAL
Abs Immature Granulocytes: 0.06 10*3/uL (ref 0.00–0.07)
Basophils Absolute: 0.1 10*3/uL (ref 0.0–0.1)
Basophils Relative: 0 %
Eosinophils Absolute: 0.1 10*3/uL (ref 0.0–0.5)
Eosinophils Relative: 1 %
Immature Granulocytes: 1 %
Lymphocytes Relative: 29 %
Lymphs Abs: 3.7 10*3/uL (ref 0.7–4.0)
Monocytes Absolute: 1.3 10*3/uL — ABNORMAL HIGH (ref 0.1–1.0)
Monocytes Relative: 10 %
Neutro Abs: 7.5 10*3/uL (ref 1.7–7.7)
Neutrophils Relative %: 59 %

## 2020-07-01 LAB — COMPREHENSIVE METABOLIC PANEL
ALT: 10 U/L (ref 0–44)
AST: 9 U/L — ABNORMAL LOW (ref 15–41)
Albumin: 3.8 g/dL (ref 3.5–5.0)
Alkaline Phosphatase: 74 U/L (ref 38–126)
Anion gap: 7 (ref 5–15)
BUN: 9 mg/dL (ref 6–20)
CO2: 25 mmol/L (ref 22–32)
Calcium: 9.1 mg/dL (ref 8.9–10.3)
Chloride: 106 mmol/L (ref 98–111)
Creatinine, Ser: 0.78 mg/dL (ref 0.44–1.00)
GFR, Estimated: >60 mL/min (ref >60)
Glucose, Bld: 96 mg/dL (ref 70–99)
Potassium: 3.8 mmol/L (ref 3.5–5.1)
Sodium: 138 mmol/L (ref 135–145)
Total Bilirubin: <0.1 mg/dL — ABNORMAL LOW (ref 0.3–1.2)
Total Protein: 7.4 g/dL (ref 6.5–8.1)

## 2020-07-01 LAB — RAPID URINE DRUG SCREEN, HOSP PERFORMED
Amphetamines: NOT DETECTED
Barbiturates: NOT DETECTED
Benzodiazepines: POSITIVE — AB
Cocaine: NOT DETECTED
Opiates: NOT DETECTED
Tetrahydrocannabinol: NOT DETECTED

## 2020-07-01 LAB — ETHANOL: Alcohol, Ethyl (B): <10 mg/dL (ref <10)

## 2020-07-01 LAB — CBC
HCT: 37.4 % (ref 36.0–46.0)
Hemoglobin: 11.3 g/dL — ABNORMAL LOW (ref 12.0–15.0)
MCH: 25.5 pg — ABNORMAL LOW (ref 26.0–34.0)
MCHC: 30.2 g/dL (ref 30.0–36.0)
MCV: 84.4 fL (ref 80.0–100.0)
Platelets: 475 10*3/uL — ABNORMAL HIGH (ref 150–400)
RBC: 4.43 MIL/uL (ref 3.87–5.11)
RDW: 17 % — ABNORMAL HIGH (ref 11.5–15.5)
WBC: 12.6 10*3/uL — ABNORMAL HIGH (ref 4.0–10.5)
nRBC: 0 % (ref 0.0–0.2)

## 2020-07-01 LAB — APTT: aPTT: 31 seconds (ref 24–36)

## 2020-07-01 LAB — PROTIME-INR
INR: 1 (ref 0.8–1.2)
Prothrombin Time: 13.5 seconds (ref 11.4–15.2)

## 2020-07-01 IMAGING — CT CT HEAD W/O CM
4 series · 16 of 47 positions shown, 18 images · non-contrast
Comparison: None.

CLINICAL DATA: Dizziness and headache

EXAM:
CT HEAD WITHOUT CONTRAST
TECHNIQUE: Contiguous axial images were obtained from the base of the skull
through the vertex without intravenous contrast.

[Series 3: head without · axial · non-contrast · 0.49mm/px · z∈[-143,-23]mm · 7 of 34 slices shown, 9 images]
[im 5/34  brain]
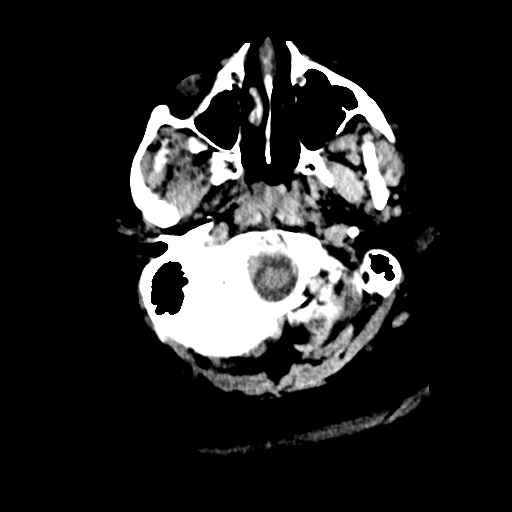
[im 5/34  bone]
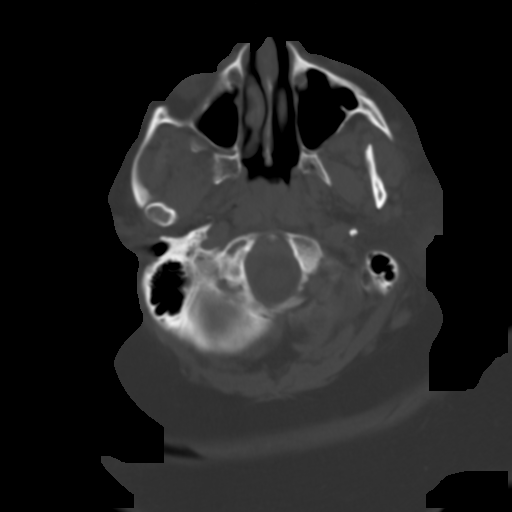
[im 9/34  brain]
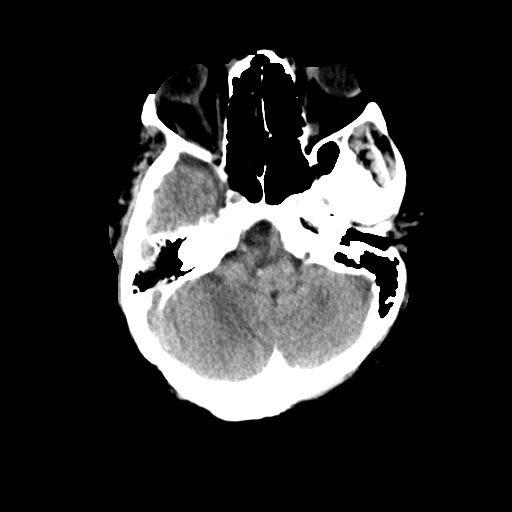
[im 13/34  brain]
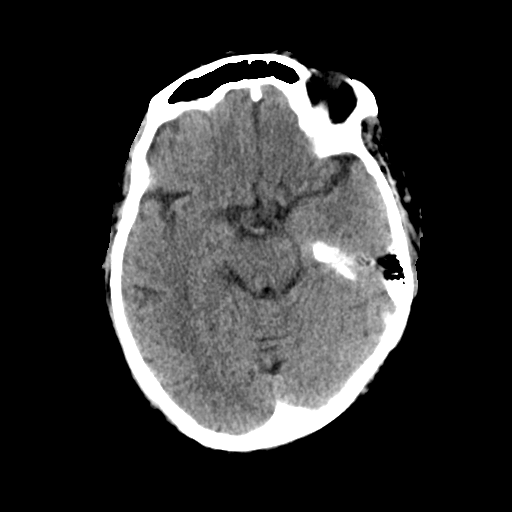
[im 17/34  brain]
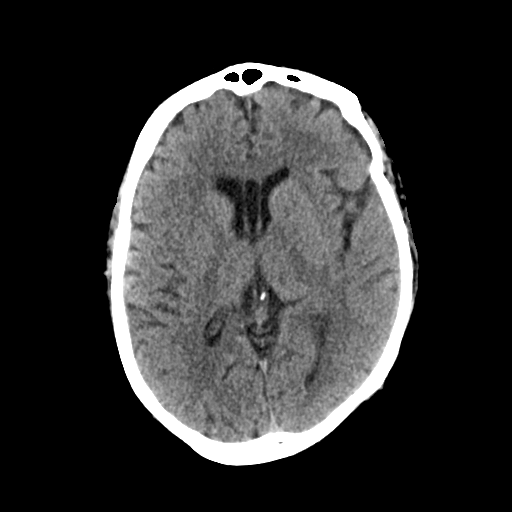
[im 21/34  brain]
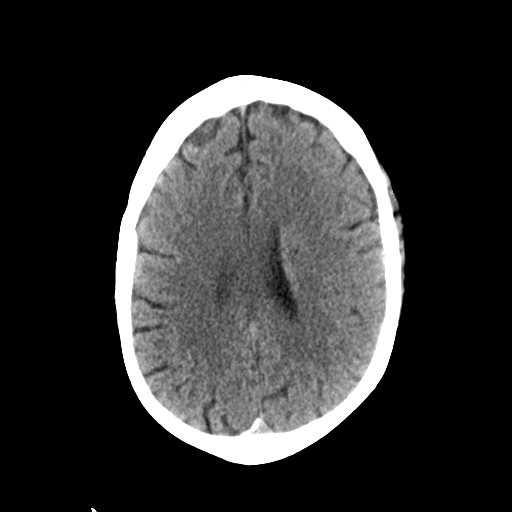
[im 21/34  bone]
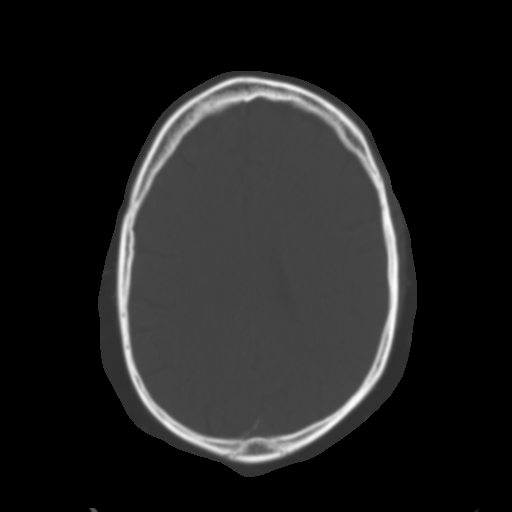
[im 25/34  brain]
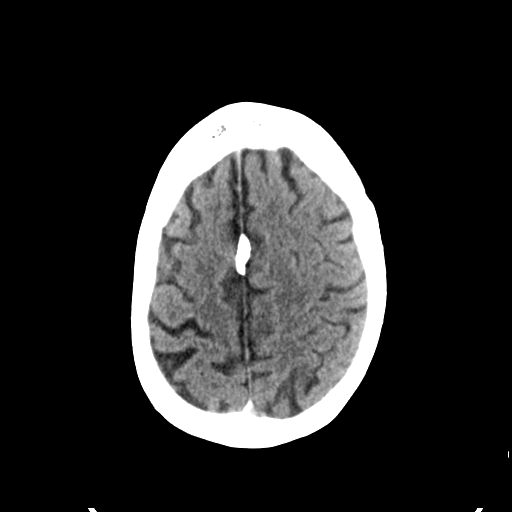
[im 29/34  brain]
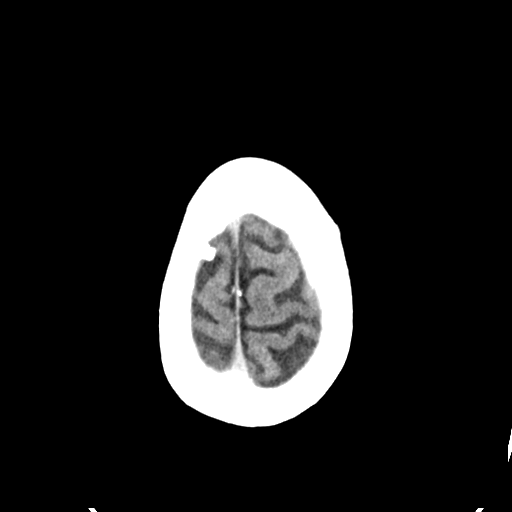

[Series 4: head bone · axial · 0.49mm/px · z∈[-147,-115]mm · 3 of 84 slices shown]
[im 9/84  bone]
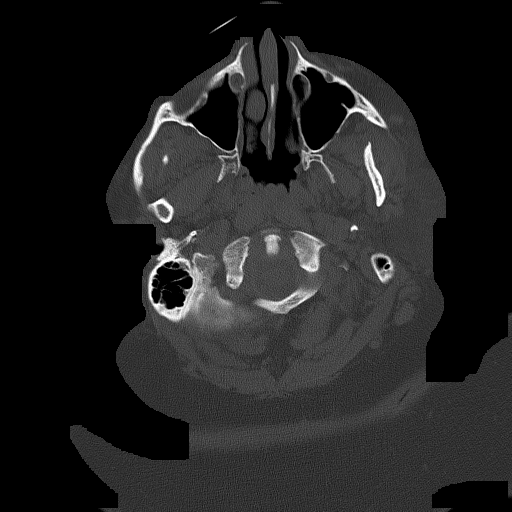
[im 17/84  bone]
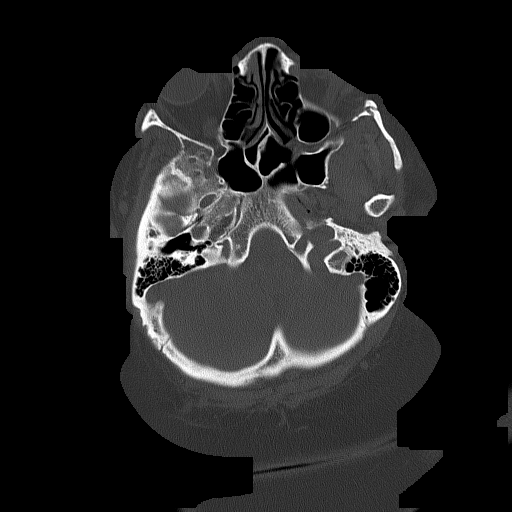
[im 25/84  bone]
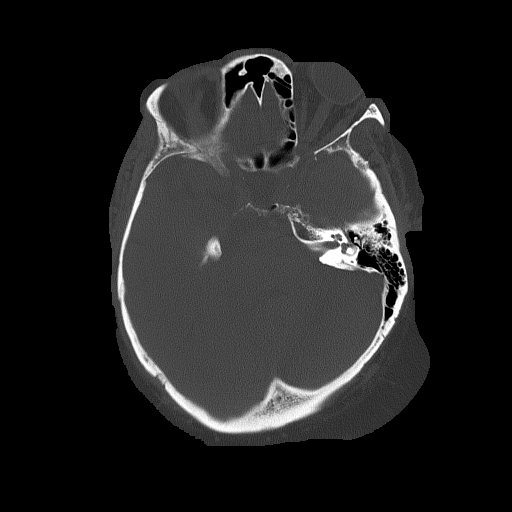

[Series 5: head without cor · coronal · non-contrast · 0.32mm/px · 3 of 71 slices shown]
[im 26/71  brain]
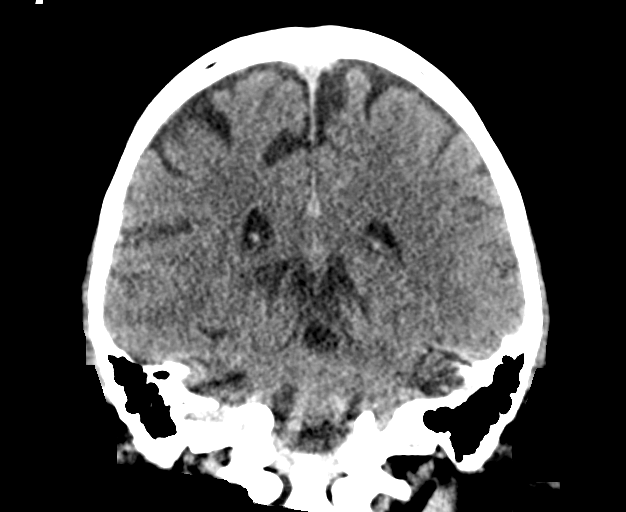
[im 33/71  brain]
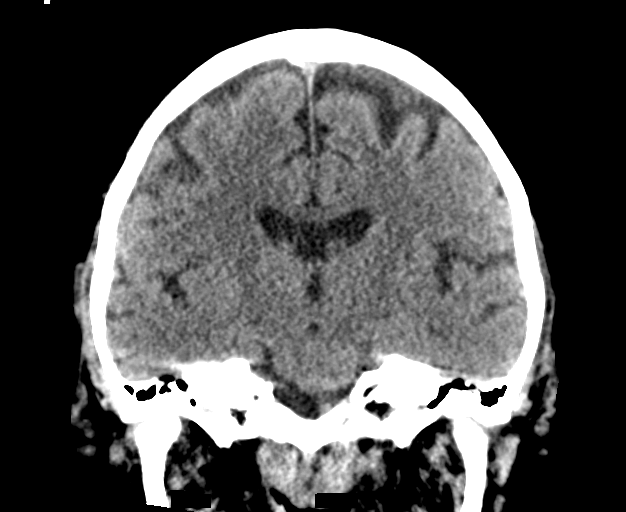
[im 39/71  brain]
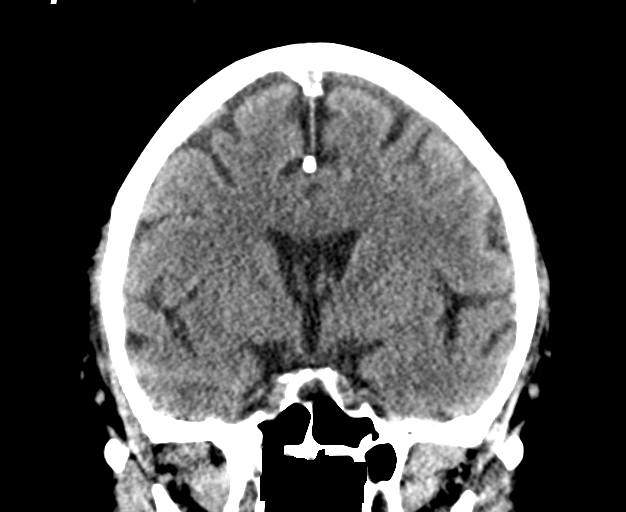

[Series 6: head without sag · sagittal · non-contrast · 0.32mm/px · 3 of 67 slices shown]
[im 27/67  brain]
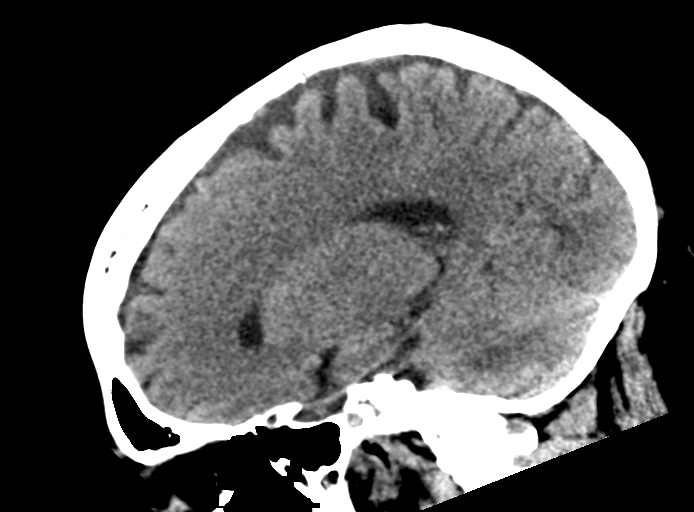
[im 34/67  brain]
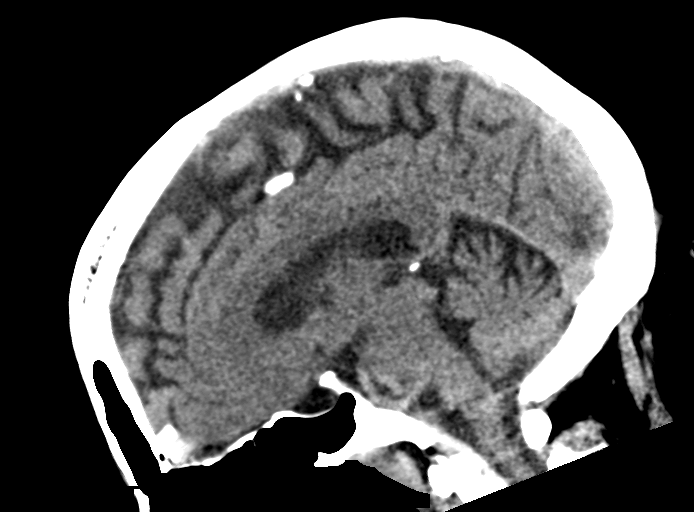
[im 41/67  brain]
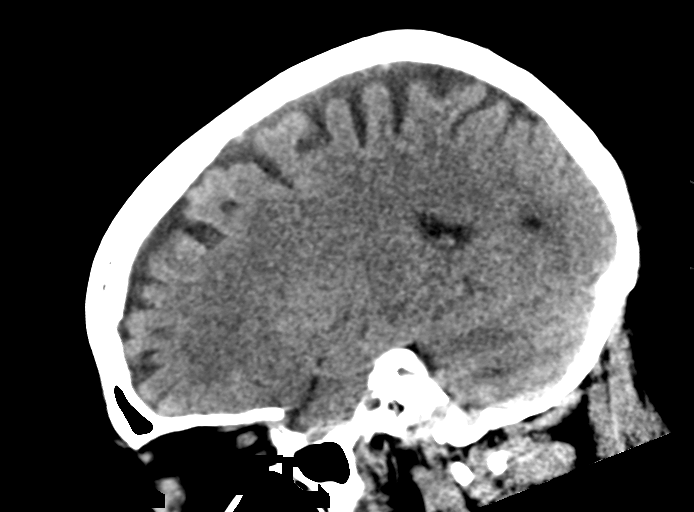

[16 of 47 positions shown; findings below may reference images not displayed]

FINDINGS: Brain: There is no mass, hemorrhage or extra-axial collection. The
size and configuration of the ventricles and extra-axial CSF spaces
are normal. The brain parenchyma is normal, without acute or chronic
infarction.

Vascular: No abnormal hyperdensity of the major intracranial
arteries or dural venous sinuses. No intracranial atherosclerosis.

Skull: The visualized skull base, calvarium and extracranial soft
tissues are normal.

Sinuses/Orbits: No fluid levels or advanced mucosal thickening of
the visualized paranasal sinuses. No mastoid or middle ear effusion.
The orbits are normal.
IMPRESSION: Normal head CT.

## 2020-07-01 IMAGING — MR MR HEAD W/O CM
10 of 11 series · 43 of 48 positions shown · non-contrast
Comparison: Brain MRI [DATE].  Head CT [DATE] and earlier.

CLINICAL DATA: 49-year-old female with dizziness and headache.

EXAM:
MRI HEAD WITHOUT CONTRAST
TECHNIQUE: Multiplanar, multiecho pulse sequences of the brain and surrounding
structures were obtained without intravenous contrast.

[Series 5: DWI · axial · 3.0mm · 0.88mm/px · z∈[-81,+66]mm · 10 of 100 slices shown (1 of 4)]
[im 1/100]
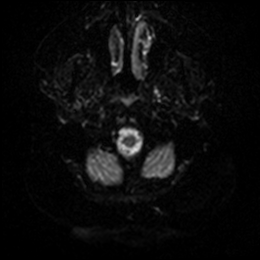
[im 12/100]
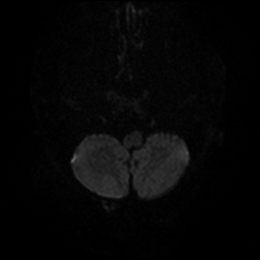
[im 23/100]
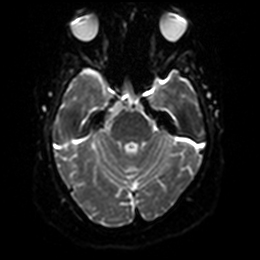
[im 34/100]
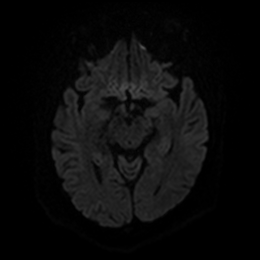
[im 45/100]
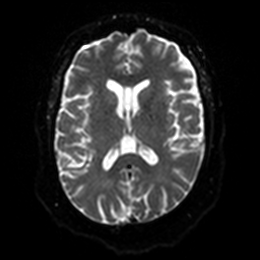
[im 56/100]
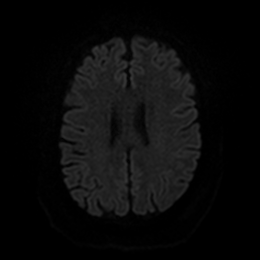
[im 67/100]
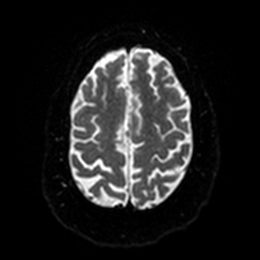
[im 78/100]
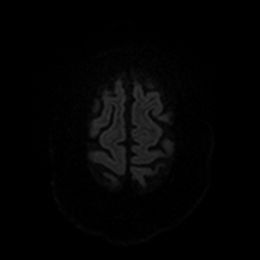
[im 89/100]
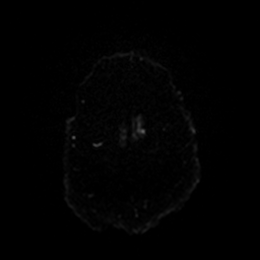
[im 100/100]
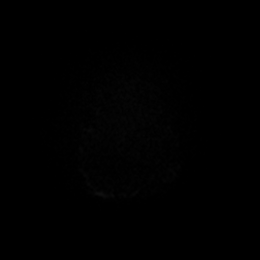

[Series 6: DWI · axial · 3.0mm · 0.88mm/px · z∈[-81,+66]mm · 5 of 50 slices shown (2 of 4)]
[im 1/50]
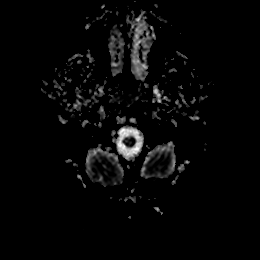
[im 13/50]
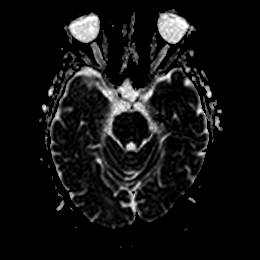
[im 25/50]
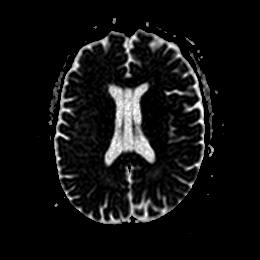
[im 37/50]
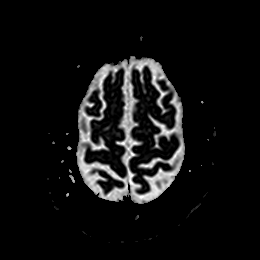
[im 50/50]
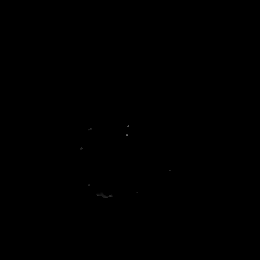

[Series 7: DWI · coronal · 4.0mm · 0.88mm/px · 6 of 66 slices shown (3 of 4)]
[im 1/66]
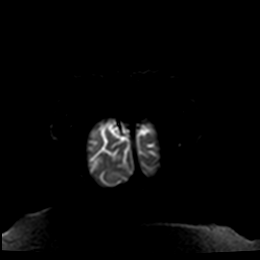
[im 14/66]
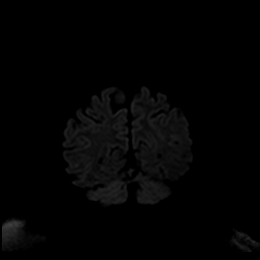
[im 27/66]
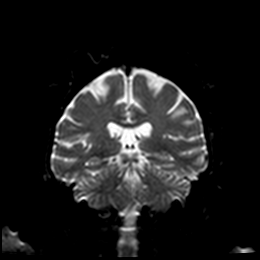
[im 40/66]
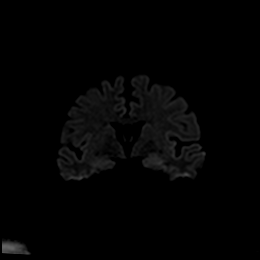
[im 53/66]
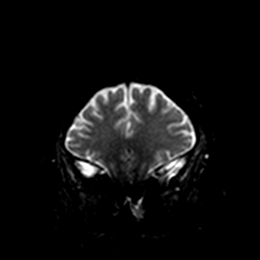
[im 66/66]
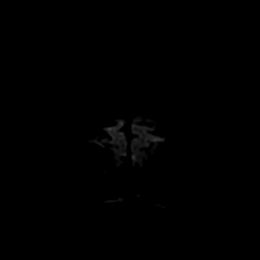

[Series 8: DWI · coronal · 4.0mm · 0.88mm/px · 3 of 32 slices shown (4 of 4)]
[im 1/32]
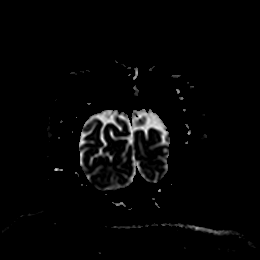
[im 16/32]
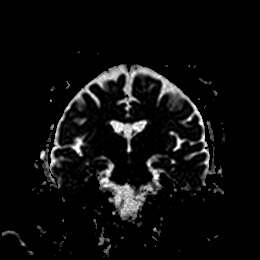
[im 32/32]
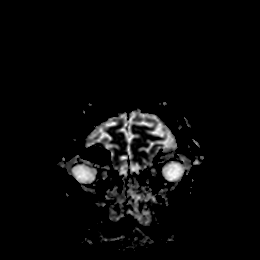

[Series 9: T1 · sagittal · 5.0mm · 0.78mm/px · 2 of 23 slices shown]
[im 1/23]
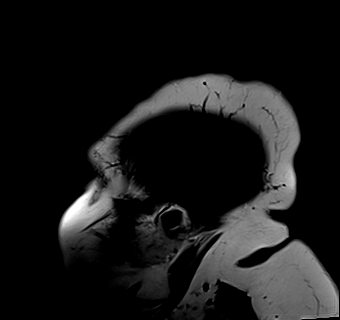
[im 23/23]
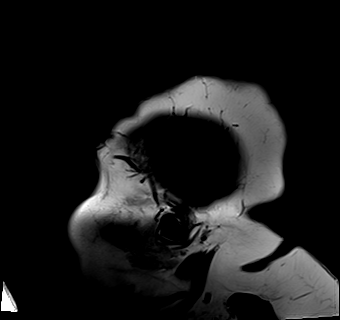

[Series 10: T2 · axial · 5.0mm · 0.72mm/px · z∈[-85,+58]mm · 2 of 25 slices shown (1 of 2)]
[im 1/25]
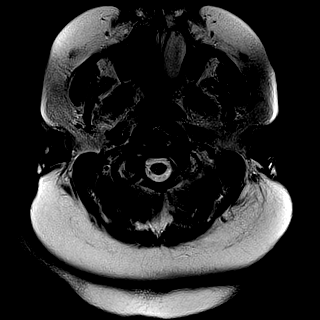
[im 25/25]
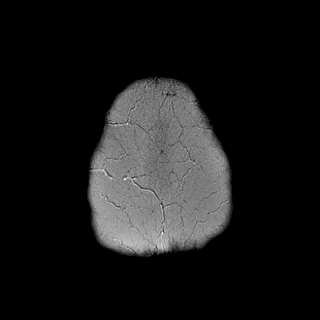

[Series 11: FLAIR · axial · 5.0mm · 0.45mm/px · z∈[-85,+59]mm · 2 of 25 slices shown]
[im 1/25]
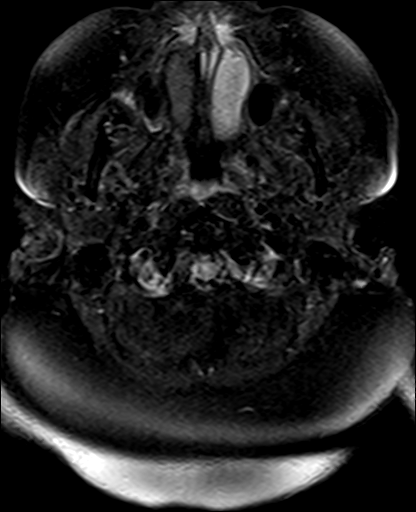
[im 25/25]
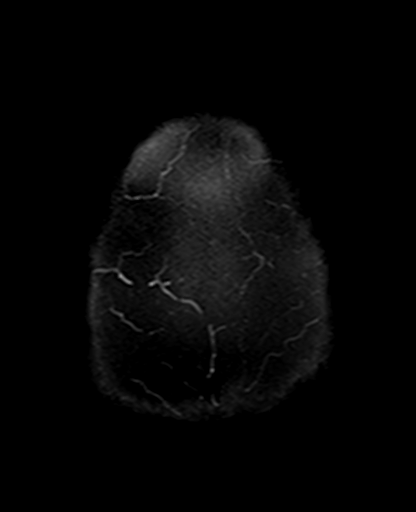

[Series 13: pha_images · axial · 3.0mm · 0.90mm/px · z∈[-95,+81]mm · 5 of 60 slices shown]
[im 1/60]
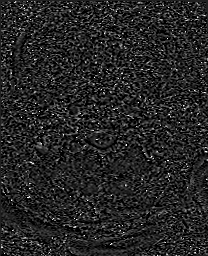
[im 15/60]
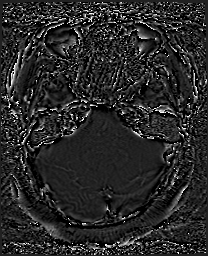
[im 30/60]
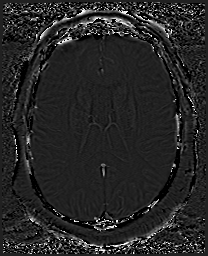
[im 45/60]
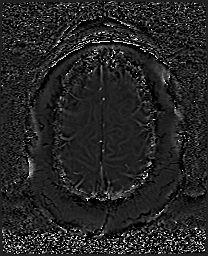
[im 60/60]
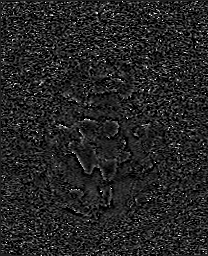

[Series 14: swi_images · axial · 3.0mm · 0.90mm/px · z∈[-95,+81]mm · 5 of 60 slices shown]
[im 1/60]
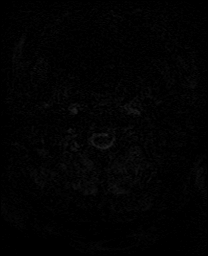
[im 15/60]
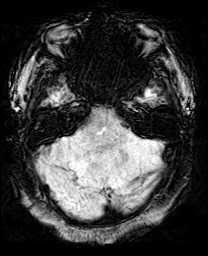
[im 30/60]
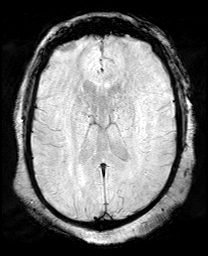
[im 45/60]
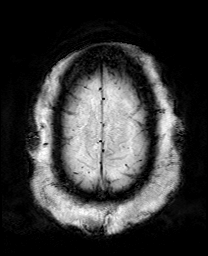
[im 60/60]
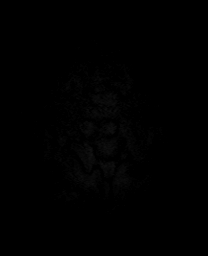

[Series 17: T2 · coronal · 5.0mm · 0.34mm/px · 3 of 29 slices shown (2 of 2)]
[im 1/29]
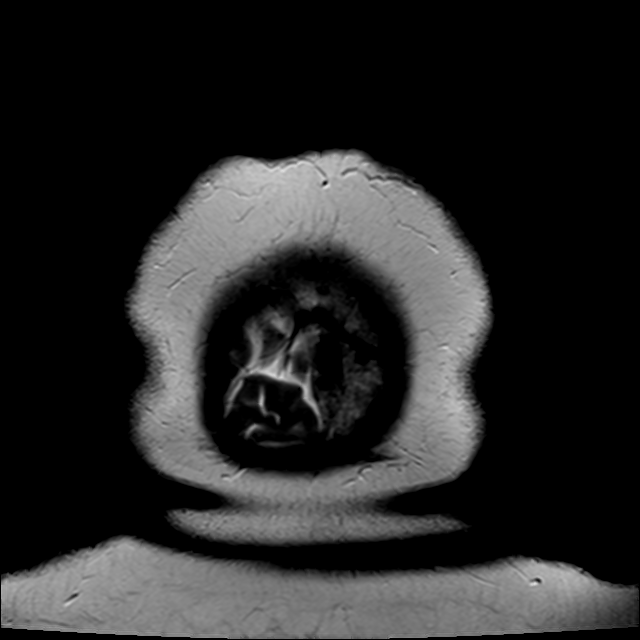
[im 15/29]
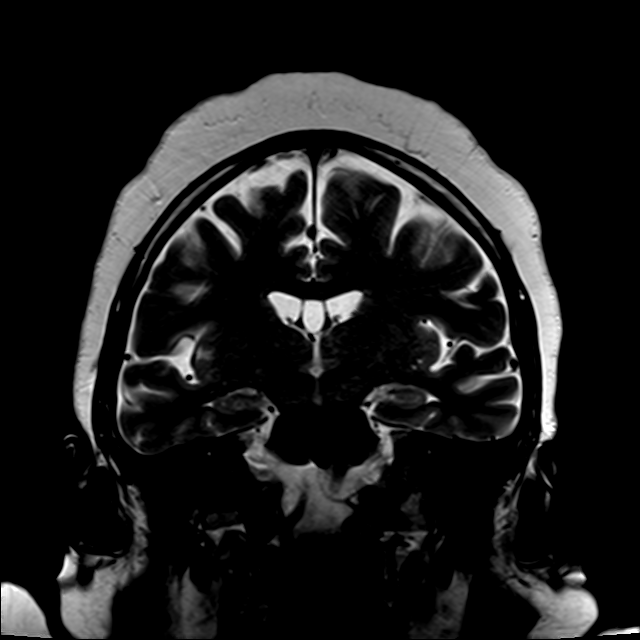
[im 29/29]
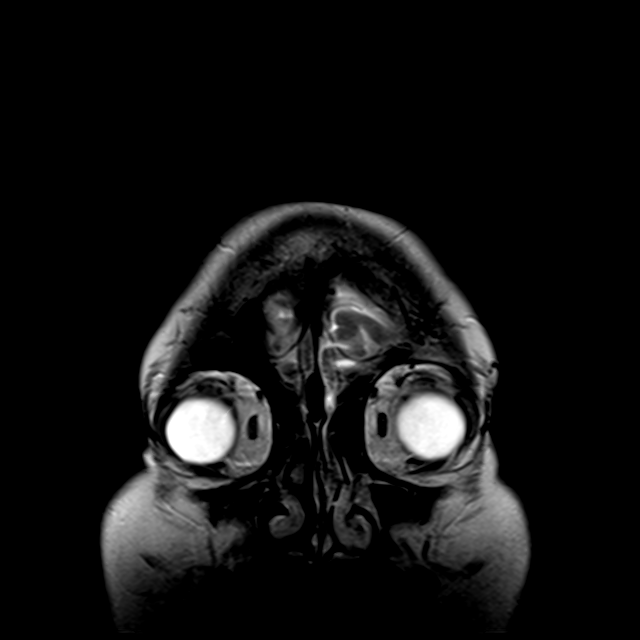

[43 of 48 positions shown; findings below may reference images not displayed]

FINDINGS: Brain: No restricted diffusion or evidence of acute infarction.

Cavum septum pellucidum, normal variant. No midline shift, mass
effect, evidence of mass lesion, ventriculomegaly, extra-axial
collection or acute intracranial hemorrhage. Cervicomedullary
junction is within normal limits. Chronic partially empty sella.

Stable and largely normal for age gray and white matter signal
throughout the brain. Questionable unchanged tiny linear chronic
cerebellar infarcts on series 10, image 5. no other
encephalomalacia. No chronic cerebral blood products.

Vascular: Major intracranial vascular flow voids are stable.
Dominant and tortuous right vertebral V4 segment.

Skull and upper cervical spine: Negative.

Sinuses/Orbits: Stable, negative.

Other: Mastoids remain clear. Grossly normal visible internal
auditory structures. Normal stylomastoid foramina. Negative visible
scalp and face.
IMPRESSION: 1. No acute intracranial abnormality.
2. Chronic partially empty sella which is often a normal anatomic
variant but can be associated with idiopathic intracranial
hypertension (pseudotumor cerebri).
3. Possible tiny chronic cerebellar infarcts, unchanged.

## 2020-07-01 MED ORDER — MECLIZINE HCL 25 MG PO TABS: 25.0000 mg | ORAL_TABLET | Freq: Three times a day (TID) | ORAL | 0 refills | Status: DC | PRN

## 2020-07-01 MED ORDER — LORAZEPAM 2 MG/ML IJ SOLN
1.0000 mg | Freq: Once | INTRAMUSCULAR | Status: AC
  Administered 2020-07-01: 1 mg via INTRAVENOUS
  Filled 2020-07-01: qty 1

## 2020-07-01 MED ORDER — ONDANSETRON HCL 4 MG PO TABS: 4.0000 mg | ORAL_TABLET | Freq: Four times a day (QID) | ORAL | 0 refills | Status: DC | PRN

## 2020-07-01 MED ORDER — MECLIZINE HCL 25 MG PO TABS
25.0000 mg | ORAL_TABLET | Freq: Once | ORAL | Status: AC
  Administered 2020-07-01: 25 mg via ORAL
  Filled 2020-07-01: qty 1

## 2020-07-01 MED ORDER — ONDANSETRON HCL 4 MG/2ML IJ SOLN
4.0000 mg | Freq: Once | INTRAMUSCULAR | Status: AC
  Administered 2020-07-01: 4 mg via INTRAVENOUS
  Filled 2020-07-01: qty 2

## 2020-07-01 MED ORDER — SODIUM CHLORIDE 0.9 % IV BOLUS
500.0000 mL | Freq: Once | INTRAVENOUS | Status: AC
  Administered 2020-07-01: 500 mL via INTRAVENOUS

## 2020-07-01 NOTE — ED Notes (Signed)
Patient verbalizes understanding of discharge instructions. Prescriptions reviewed. Opportunity for questioning and answers were provided. Armband removed by staff, pt discharged from ED via wheelchair to the lobby to wait on cab. Cab voucher was provided to pt.

## 2020-07-01 NOTE — ED Provider Notes (Signed)
Emergency Medicine Provider Triage Evaluation Note  Lindsay [name] , a 50 y.o. female  was evaluated in triage.  Pt complains of headaches over the past few days and dizziness starting today.  States it feels like the room spins and is "slanted", worse when turning head or moving quickly.  Some nausea associated without vomiting.  Had MRI for same in March 2022 which showed chronic small cerebellar strokes.  She is not on ASA or plavix currently.  She was referred to neurology but never went to her appt.  Review of Systems  Positive: Dizziness, nausea Negative: Vomiting, diarrhea, fever  Physical Exam  BP (!) 181/98 (BP Location: Left Arm)   Pulse 86   Temp 99.1 F (37.3 C) (Oral)   Resp 18   Ht 5\' 11"  (1.803 m)   Wt (!) 181.4 kg   SpO2 96%   BMI 55.79 kg/m  Gen:   Awake, no distress  Resp:  Normal effort  MSK:   Moves extremities without difficulty, no focal numbness/weakness appreciated on brief exam, speech clear  Medical Decision Making  Medically screening exam initiated at 1:43 AM.  Appropriate orders placed.  [name] was informed that the remainder of the evaluation will be completed by another provider, this initial triage assessment does not replace that evaluation, and the importance of remaining in the ED until their evaluation is complete.   Larene Pickett, PA-C 07/01/20 0154    Orpah Greek, MD 07/01/20 [phone]

## 2020-07-01 NOTE — ED Provider Notes (Signed)
[facility] EMERGENCY DEPARTMENT Provider Note   CSN: [id number] Arrival date & time: 07/01/20  0143     History Chief Complaint  Patient presents with  . Dizziness    Lindsay [name] is a 50 y.o. female.  Patient presents to the emergency department for evaluation of dizziness.  Patient reports that she has been experiencing headache for the last couple of days.  Tonight she rolled over in bed and had sudden onset of feeling like she was spinning.  Patient reports persistent severe dizziness.  She reports that she had similar symptoms in January and was told that it was secondary to a stroke.        Past Medical History:  Diagnosis Date  . Acid reflux   . Anemia    Iron Def  . Anorexia   . CHF (congestive heart failure) ([place])   . Chronic kidney disease    Nephrotic syndrome  . Colon polyp 2009  . Depression with anxiety   . Edema leg   . Fibromyalgia   . Hemorrhoids   . Hidradenitis suppurativa   . Hypertension   . IBS (irritable bowel syndrome)   . Low back pain   . Migraines   . Morbidly obese ([place])   . Neuromuscular disorder ([facility])    fibromyalgia  . Neuropathy   . Panic attacks   . Polyp of vocal cord or larynx   . Tonsil pain     Patient Active Problem List   Diagnosis Date Noted  . Diabetes ([place]) 02/03/2020  . HLD (hyperlipidemia) 02/03/2020  . Dizziness 02/02/2020  . Pain in both knees 12/09/2019  . Shortness of breath   . Chronic heart failure ([place]) 11/25/2019  . OSA (obstructive sleep apnea) 06/14/2019  . Right shoulder pain 04/21/2019  . Gum lesion 03/15/2019  . Compression fracture of body of thoracic vertebra ([place]) 03/08/2019  . Viral URI 02/24/2019  . Iron deficiency 02/10/2019  . History of colon polyps   . Chronic back pain 12/05/2017  . Right arm pain 12/05/2017  . Chronic respiratory failure with hypoxia ([place])   . Vitamin B12 deficiency 05/02/2017  . Palpitations 04/11/2017  . Nutritional anemia 06/17/2016  .  Vitamin D deficiency, unspecified 06/12/2016  . Nicotine dependence 06/12/2016  . Menorrhagia 01/08/2016  . Migraines 01/08/2016  . Leg pain, bilateral 11/29/2015  . Fibromyalgia 07/05/2015  . Chronic leg pain 09/26/2014  . Healthcare maintenance 09/26/2014  . Morbid obesity ([place]) 07/04/2014  . Major depressive disorder, recurrent episode, moderate ([place]) 10/13/2012  . Generalized anxiety disorder 10/13/2012  . Chronic nausea 05/10/2010  . IBS (irritable bowel syndrome) 06/16/2007  . Essential hypertension 02/06/2006  . Hidradenitis 11/18/2005    Past Surgical History:  Procedure Laterality Date  . AXILLARY HIDRADENITIS EXCISION    . COLONOSCOPY WITH PROPOFOL N/A 05/25/2015   Procedure: COLONOSCOPY WITH PROPOFOL;  Surgeon: Milus Banister, MD;  Location: [facility];  Service: Endoscopy;  Laterality: N/A;  . COLONOSCOPY WITH PROPOFOL N/A 12/31/2018   Procedure: COLONOSCOPY WITH PROPOFOL;  Surgeon: Milus Banister, MD;  Location: [facility];  Service: Endoscopy;  Laterality: N/A;  . HEMORRHOID SURGERY     with Hidradenitis surgery   . INGUINAL HIDRADENITIS EXCISION    . TONSILLECTOMY  10/18/2010   by Dr. Wilburn Cornelia  . UPPER GASTROINTESTINAL ENDOSCOPY       OB History    Gravida  2   Para  1   Term      Preterm  AB  1   Living        SAB      IAB  1   Ectopic      Multiple      Live Births              Family History  Problem Relation Age of Onset  . Diabetes Mother   . Heart disease Mother        valve leak  . Anxiety disorder Mother   . Depression Mother   . High blood pressure Mother   . Kidney failure Mother   . Cancer Father        prostate  . Heart disease Father   . Learning disabilities Sister   . Depression Sister   . Depression Sister   . Anxiety disorder Sister   . Colon cancer Neg Hx     Social History   Tobacco Use  . Smoking status: Former Smoker    Packs/day: 0.10    Years: 25.00    Pack years: 2.50    Types:  Cigarettes    Quit date: 05/24/2019    Years since quitting: 1.1  . Smokeless tobacco: Never Used  . Tobacco comment: quit in April.  Vaping Use  . Vaping Use: Never used  Substance Use Topics  . Alcohol use: Not Currently    Alcohol/week: 0.0 standard drinks    Comment: none sine 05/2018  . Drug use: Not Currently    Frequency: 3.0 times per week    Types: Marijuana    Home Medications Prior to Admission medications   Medication Sig Start Date End Date Taking? Authorizing Provider  meclizine (ANTIVERT) 25 MG tablet Take 1 tablet (25 mg total) by mouth 3 (three) times daily as needed for dizziness. 07/01/20  Yes Kilynn Fitzsimmons, Gwenyth Allegra, MD  omeprazole (PRILOSEC) 40 MG capsule TAKE 1 CAPSULE BY MOUTH EVERY DAY 04/20/20   Velna Ochs, MD  ondansetron (ZOFRAN) 4 MG tablet Take 1 tablet (4 mg total) by mouth every 6 (six) hours as needed for nausea or vomiting. 07/01/20  Yes Myrna Vonseggern, Gwenyth Allegra, MD  Accu-Chek FastClix Lancets MISC Use one lancet to check blood sugar one a day as needed 02/16/20   Velna Ochs, MD  acetaminophen (TYLENOL) 650 MG CR tablet Take 1,300 mg by mouth every 8 (eight) hours as needed for pain.    [provider]  albuterol (PROVENTIL) (2.5 MG/3ML) 0.083% nebulizer solution TAKE 3 MLS (2.5 MG TOTAL) BY NEBULIZATION EVERY 4 (FOUR) HOURS. 08/31/19   Olalere, Adewale A, MD  albuterol (VENTOLIN HFA) 108 (90 Base) MCG/ACT inhaler TAKE 2 PUFFS BY MOUTH EVERY 6 HOURS AS NEEDED FOR WHEEZE OR SHORTNESS OF BREATH 06/08/20   Olalere, Adewale A, MD  amitriptyline (ELAVIL) 25 MG tablet Take one tab (25 mg) by mouth at bedtime. 05/19/20   [name], Arlyce Harman, MD  Ascorbic Acid (VITAMIN C PO) Take 1 tablet by mouth daily.    [provider]  atorvastatin (LIPITOR) 40 MG tablet Take 1 tablet (40 mg total) by mouth daily. 02/03/20   Velna Ochs, MD  BIOTIN PO Take 1 tablet by mouth daily.    [provider]  Blood Glucose Monitoring Suppl (ACCU-CHEK GUIDE  ME) w/Device KIT Dispense one device 02/16/20   Velna Ochs, MD  Brexpiprazole (REXULTI) 3 MG TABS Take 1 tablet (3 mg total) by mouth daily. 06/08/20   [name], Arlyce Harman, MD  Capsaicin 0.025 % PTCH Apply 1 patch topically daily as  needed. 02/16/20   Velna Ochs, MD  cetirizine (ZYRTEC) 10 MG tablet TAKE 1 TABLET BY MOUTH EVERY DAY 02/07/20   Sherrilyn Rist A, MD  cholecalciferol (VITAMIN D) 25 MCG (1000 UNIT) tablet TAKE 1 TABLET BY MOUTH EVERY DAY 02/23/20   Velna Ochs, MD  clindamycin (CLINDAGEL) 1 % gel APPLY TO AFFECTED AREA TWICE A DAY 04/03/20   Velna Ochs, MD  clorazepate (TRANXENE) 3.75 MG tablet Take 1 tablet (3.75 mg total) by mouth daily. 06/08/20   [name], Arlyce Harman, MD  diclofenac Sodium (VOLTAREN) 1 % GEL APPLY 2 GRAMS TOPICALLY 4 (FOUR) TIMES DAILY AS NEEDED FOR KNEE PAIN 06/05/20   Velna Ochs, MD  ENTRESTO 24-26 MG Take 1 tablet by mouth 2 (two) times daily. 01/03/20   [provider]  famotidine (PEPCID) 20 MG tablet Take 1 tablet (20 mg total) by mouth daily. 04/04/20   Velna Ochs, MD  gabapentin (NEURONTIN) 300 MG capsule Take 3 capsules (900 mg total) by mouth at bedtime. 02/02/20   Velna Ochs, MD  glucose blood (ACCU-CHEK GUIDE) test strip Use one test strip to check blood sugars once a day as needed 02/16/20   Velna Ochs, MD  hydrOXYzine (VISTARIL) 50 MG capsule Take 1 capsule (50 mg total) by mouth at bedtime as needed for itching. 06/08/20   [name], Arlyce Harman, MD  lamoTRIgine (LAMICTAL) 150 MG tablet Take 1 tablet (150 mg total) by mouth daily. 06/08/20   [name], Arlyce Harman, MD  lidocaine (XYLOCAINE) 5 % ointment Apply 1 application topically as needed for mild pain. 04/03/20   Velna Ochs, MD  megestrol (MEGACE) 40 MG tablet TAKE 2 TABLETS BY MOUTH EVERY DAY 04/24/20   Anyanwu, Sallyanne Havers, MD  metFORMIN (GLUCOPHAGE-XR) 500 MG 24 hr tablet TAKE 2 TABLETS BY MOUTH EVERY DAY WITH BREAKFAST 06/30/20   Velna Ochs, MD  metoprolol succinate  (TOPROL-XL) 50 MG 24 hr tablet Take 50 mg by mouth daily. 01/03/20   [provider]  promethazine (PHENERGAN) 12.5 MG tablet TAKE 1 TABLET BY MOUTH EVERY 6 HOURS AS NEEDED FOR NAUSEA OR VOMITING. 06/28/20   Milus Banister, MD  SYMBICORT 160-4.5 MCG/ACT inhaler Inhale 2 puffs into the lungs in the morning and at bedtime. 06/28/20   Olalere, Cicero Duck A, MD  torsemide (DEMADEX) 20 MG tablet TAKE 2 TABLETS BY MOUTH 2 TIMES DAILY. 04/07/20   Velna Ochs, MD  vitamin B-12 (CYANOCOBALAMIN) 1000 MCG tablet Take 1 tablet (1,000 mcg total) by mouth daily. 05/06/17   Velna Ochs, MD  Vitamin E 400 units TABS Take 400 Units by mouth daily.     [provider]    Allergies    Lisinopril  Review of Systems   Review of Systems  Neurological: Positive for dizziness and headaches.  All other systems reviewed and are negative.   Physical Exam Updated Vital Signs BP (!) 162/108 (BP Location: Right Arm)   Pulse 88   Temp 98.7 F (37.1 C) (Oral)   Resp 18   Ht _0  (1.778 m)   Wt (!) 180.3 kg   SpO2 100%   BMI 57.03 kg/m   Physical Exam Vitals and nursing note reviewed.  Constitutional:      General: She is not in acute distress.    Appearance: Normal appearance. She is well-developed.  HENT:     Head: Normocephalic and atraumatic.     Right Ear: Hearing normal.     Left Ear: Hearing normal.     Nose: Nose normal.  Eyes:     Conjunctiva/sclera: Conjunctivae normal.     Pupils: Pupils are equal, round, and reactive to light.  Cardiovascular:     Rate and Rhythm: Regular rhythm.     Heart sounds: S1 normal and S2 normal. No murmur heard. No friction rub. No gallop.   Pulmonary:     Effort: Pulmonary effort is normal. No respiratory distress.     Breath sounds: Normal breath sounds.  Chest:     Chest wall: No tenderness.  Abdominal:     General: Bowel sounds are normal.     Palpations: Abdomen is soft.     Tenderness: There is no abdominal tenderness. There is  no guarding or rebound. Negative signs include Murphy's sign and McBurney's sign.     Hernia: No hernia is present.  Musculoskeletal:        General: Normal range of motion.     Cervical back: Normal range of motion and neck supple.  Skin:    General: Skin is warm and dry.     Findings: No rash.  Neurological:     Mental Status: She is alert and oriented to person, place, and time.     GCS: GCS eye subscore is 4. GCS verbal subscore is 5. GCS motor subscore is 6.     Cranial Nerves: No cranial nerve deficit.     Sensory: No sensory deficit.     Coordination: Coordination normal.     Comments: Normal strength and sensation all extremities.  Psychiatric:        Speech: Speech normal.        Behavior: Behavior normal.        Thought Content: Thought content normal.     ED Results / Procedures / Treatments   Labs (all labs ordered are listed, but only abnormal results are displayed) Labs Reviewed  CBC - Abnormal; Notable for the following components:      Result Value   WBC 12.6 (*)    Hemoglobin 11.3 (*)    MCH 25.5 (*)    RDW 17.0 (*)    Platelets 475 (*)    All other components within normal limits  DIFFERENTIAL - Abnormal; Notable for the following components:   Monocytes Absolute 1.3 (*)    All other components within normal limits  COMPREHENSIVE METABOLIC PANEL - Abnormal; Notable for the following components:   AST 9 (*)    Total Bilirubin <0.1 (*)    All other components within normal limits  RAPID URINE DRUG SCREEN, HOSP PERFORMED - Abnormal; Notable for the following components:   Benzodiazepines POSITIVE (*)    All other components within normal limits  URINALYSIS, ROUTINE W REFLEX MICROSCOPIC - Abnormal; Notable for the following components:   APPearance HAZY (*)    Specific Gravity, Urine 1.033 (*)    Protein, ur 100 (*)    Bacteria, UA MANY (*)    All other components within normal limits  ETHANOL  PROTIME-INR  APTT    EKG EKG  Interpretation  Date/Time:  Saturday July 01 2020 01:51:21 EDT Ventricular Rate:  89 PR Interval:  146 QRS Duration: 90 QT Interval:  394 QTC Calculation: 479 R Axis:   74 Text Interpretation: Normal sinus rhythm Septal infarct , age undetermined Abnormal ECG Confirmed by Orpah Greek [phone]) on 07/01/2020 3:26:09 AM   Radiology No results found.  Procedures Procedures   Medications Ordered in ED Medications  sodium chloride 0.9 % bolus 500 mL (0 mLs Intravenous Stopped 07/01/20  2751)  LORazepam (ATIVAN) injection 1 mg (1 mg Intravenous Given 07/01/20 0451)  ondansetron (ZOFRAN) injection 4 mg (4 mg Intravenous Given 07/01/20 0622)  meclizine (ANTIVERT) tablet 25 mg (25 mg Oral Given 07/01/20 7001)    ED Course  I have reviewed the triage vital signs and the nursing notes.  Pertinent labs & imaging results that were available during my care of the patient were reviewed by me and considered in my medical decision making (see chart for details).    MDM Rules/Calculators/A&P                          Patient presents to the emergency department for evaluation of dizziness.  Patient reports that she rolled over in bed and started having vertigo symptoms.  This seems classic for benign positional vertigo, but patient reports that she had similar symptoms in the past with a stroke.  When I review her records, however, her MRI was in March and showed nonacute cerebellar infarcts.  She reports that her symptoms were not January.  Its not clear if she actually had an acute stroke causing the symptoms in the past based on this.  Today, however, it is clear that she does not have an acute stroke.  MRI did not show any acute process.  Lab work was reassuring.  We will treat for positional vertigo.  Final Clinical Impression(s) / ED Diagnoses Final diagnoses:  Benign paroxysmal positional vertigo, unspecified laterality    Rx / DC Orders ED Discharge Orders         Ordered    meclizine  (ANTIVERT) 25 MG tablet  3 times daily PRN        07/01/20 0615    ondansetron (ZOFRAN) 4 MG tablet  Every 6 hours PRN        07/01/20 0615           Orpah Greek, MD 07/03/20 [phone]

## 2020-07-01 NOTE — ED Triage Notes (Signed)
Patient arrived with EMS from home reports dizziness with headache this week , denies head injury , no emesis or fever . CBG=100 , no neuro deficits .

## 2020-07-01 NOTE — ED Notes (Signed)
Pt transported to MRI 

## 2020-07-04 ENCOUNTER — Encounter: Payer: Self-pay | Admitting: Dietician

## 2020-07-04 DIAGNOSIS — Z5189 Encounter for other specified aftercare: Secondary | ICD-10-CM | POA: Diagnosis not present

## 2020-07-05 ENCOUNTER — Other Ambulatory Visit: Payer: Self-pay

## 2020-07-05 ENCOUNTER — Encounter: Payer: Self-pay | Admitting: Internal Medicine

## 2020-07-05 ENCOUNTER — Ambulatory Visit (INDEPENDENT_AMBULATORY_CARE_PROVIDER_SITE_OTHER): Payer: Medicare Other | Admitting: Internal Medicine

## 2020-07-05 DIAGNOSIS — R42 Dizziness and giddiness: Secondary | ICD-10-CM | POA: Diagnosis not present

## 2020-07-05 DIAGNOSIS — Z8673 Personal history of transient ischemic attack (TIA), and cerebral infarction without residual deficits: Secondary | ICD-10-CM | POA: Diagnosis not present

## 2020-07-05 DIAGNOSIS — I10 Essential (primary) hypertension: Secondary | ICD-10-CM

## 2020-07-05 DIAGNOSIS — E1121 Type 2 diabetes mellitus with diabetic nephropathy: Secondary | ICD-10-CM

## 2020-07-05 MED ORDER — OZEMPIC (0.25 OR 0.5 MG/DOSE) 2 MG/1.5ML ~~LOC~~ SOPN: 0.2500 mg | PEN_INJECTOR | SUBCUTANEOUS | 0 refills | Status: DC

## 2020-07-05 MED ORDER — MECLIZINE HCL 25 MG PO TABS: 25.0000 mg | ORAL_TABLET | Freq: Three times a day (TID) | ORAL | 2 refills | Status: DC | PRN

## 2020-07-05 NOTE — Progress Notes (Signed)
    This is a telephone encounter between [name] and Lindsay [name] on 07/05/2020 for dizziness and follow up of her chronic medical conditions. The visit was conducted with the patient located at home and Lindsay [name] at [facility]. The patient's identity was confirmed using their DOB and current address. The patient has consented to being evaluated through a telephone encounter and understands the associated risks (an examination cannot be done and the patient may need to come in for an appointment) / benefits (allows the patient to remain at home, decreasing exposure to coronavirus). I personally spent 15 minutes on medical discussion.   CC: Dizziness  HPI:  [name] is a 50 y.o. female with past medical history outlined below. Patient was contacted for a telehealth appointment for follow up of her chronic medical conditions and acute complaint of dizziness. For the details of today's visit, please refer to the assessment and plan.   Review of Systems  Neurological: Positive for dizziness and headaches. Negative for focal weakness.     Assessment & Plan:   See Encounters Tab for problem based charting.

## 2020-07-05 NOTE — Assessment & Plan Note (Signed)
Newly diagnosed in January of this year and started on metformin 500 mg XL daily.  She has tolerated this well, denies side effects.  She is due for repeat hemoglobin A1c, unfortunately was unable to make her appointment in person today due to acute worsening of her chronic dizziness.  I again discussed the option of GLP-1 agonist therapy for the dual purpose of weight loss.  She is morbidly obese with a BMI of 57.  She has multiple comorbid conditions related to her obesity including sleep apnea, obesity hypoventilation, chronic hypoxic respiratory failure, chronic pain, and hidradenitis.  She is now interested in and Ozempic trial.  I have sent a prescription to her pharmacy for 0.25 mg weekly x4 weeks.  If covered by her insurance, we will plan to uptitrate every 4 weeks based on tolerability.  Follow-up in person in 1 month.

## 2020-07-05 NOTE — Assessment & Plan Note (Signed)
Patient reports intermittent episodes of severe hypertension, up to 833V systolic.  She does check her blood pressure at home and notes she is normally in the 120s.  She is prescribed metoprolol 50 mg daily and historically has been well controlled on this.  She was diagnosed with diabetes earlier this year and started on metformin.  She had a urinalysis during recent ED visit for dizziness that showed proteinuria.  In the setting of her diabetes, she would likely benefit from an ACEi/ARB.  I have asked patient to follow-up within the next month in person for repeat hemoglobin A1c and urine microalbumin.

## 2020-07-05 NOTE — Assessment & Plan Note (Signed)
Patient is morbidly obese with a BMI of 57.  She has multiple comorbid conditions related to her obesity including sleep apnea, obesity hypoventilation, chronic hypoxic respiratory failure, chronic pain, and hidradenitis.  We are starting Ozempic for the dual purpose of weight loss and glycemic control (see diabetes A&P).  Follow-up 1 month.  Uptitrate every 4 weeks based on tolerability.

## 2020-07-05 NOTE — Assessment & Plan Note (Signed)
Patient continues to have episodes of severe dizziness with associated nausea, unable to walk or tolerate certain positional changes.  She was unable to come to her appointment today because of this and requested to be changed to telehealth.  She first developed the symptoms in January.  When episodes do occur, the dizziness is constant and last for days at a time.  I suspect this is related to her chronic cerebellar strokes.  Clinically does not sound consistent with BPPV.  She had a recent ED visit for acute worsening of her symptoms.  Repeat MRI brain showed no acute infarcts, but again demonstrated subcentimeter chronic bilateral cerebellar infarcts.  She was prescribed meclizine in the ED which she feels is helping.  I have sent refills in for this medication.  We discussed vestibular rehab both for treatment of her central dizziness but also to definitively rule out peripheral causes.  Patient was very interested in this.  Referral has been placed.

## 2020-07-06 DIAGNOSIS — J9620 Acute and chronic respiratory failure, unspecified whether with hypoxia or hypercapnia: Secondary | ICD-10-CM | POA: Diagnosis not present

## 2020-07-07 ENCOUNTER — Ambulatory Visit (INDEPENDENT_AMBULATORY_CARE_PROVIDER_SITE_OTHER): Payer: Medicare Other | Admitting: Licensed Clinical Social Worker

## 2020-07-07 DIAGNOSIS — F4312 Post-traumatic stress disorder, chronic: Secondary | ICD-10-CM

## 2020-07-07 DIAGNOSIS — F331 Major depressive disorder, recurrent, moderate: Secondary | ICD-10-CM | POA: Diagnosis not present

## 2020-07-07 DIAGNOSIS — F411 Generalized anxiety disorder: Secondary | ICD-10-CM

## 2020-07-07 DIAGNOSIS — F410 Panic disorder [episodic paroxysmal anxiety] without agoraphobia: Secondary | ICD-10-CM

## 2020-07-07 NOTE — Progress Notes (Signed)
  Virtual Visit via Telephone Note  I connected with Lindsay [name] on 07/07/20 at  8:00 AM EDT by telephone and verified that I am speaking with the correct person using two identifiers.  Location: Patient: home Provider: home office   I discussed the limitations, risks, security and privacy concerns of performing an evaluation and management service by telephone and the availability of in person appointments. I also discussed with the patient that there may be a patient responsible charge related to this service. The patient expressed understanding and agreed to proceed.    I discussed the assessment and treatment plan with the patient. The patient was provided an opportunity to ask questions and all were answered. The patient agreed with the plan and demonstrated an understanding of the instructions.   The patient was advised to call back or seek an in-person evaluation if the symptoms worsen or if the condition fails to improve as anticipated.  I provided 20 minutes of non-face-to-face time during this encounter.  THERAPIST PROGRESS NOTE  Session Time: 8:00 AM to 8:20 AM  Participation Level: Active  Behavioral Response: CasualAlertAnxious and Dysphoric  Type of Therapy: Individual Therapy  Treatment Goals addressed:  elp cope with health issues, anxiety, triggers, coping, utilize therapy as a way for patient to focus on working through current stressors that also helps to distract from pain. Interventions: Solution Focused, Strength-based, Supportive, and Other: coping  Summary: Lindsay [name] is a 50 y.o. female who presents with feeling dizzy went to ER last Friday.  said had vertigo, she was so concerned she called an ambulance, similar to symptoms secondary to a stroke had in January was benign positional vertigo still has had a headache and still has a little bit. Gave her medicine and helping. Couldn't go to EMT due to vertigo couldn't lay down to look at legs. Only had one  these symptoms one other time in January with stroke. Symptoms are scary. And what experienced with that. Still has the pain. Rescheduled to 6/21 for a EMT.  Discussed keeping session short due to patient being dizzy and encourage patient to relax and patient agreed.  Therapist reviewed symptoms facilitated expression of thoughts and feelings significant intervention was both supportive interventions for difficulties patient continues to have with health issues and process feelings.  She continues to be dizzy so session was shorter.  Noted diagnosis seems to be benign vertigo so hopefully she will ride this out. Encouraged patient to relax and take it easy.  Validated patient at the same time on being scared but again reinforced diagnosis to help ease some feelings.  Therapist provided active listening open questions, supportive interventions Suicidal/Homicidal: No  Plan: Return again in 2 weeks.2.  Therapist provided supportive and strength-based interventions work on strategies for coping  Diagnosis: Axis I: Major depressive disorder, recurrent, moderate, generalized anxiety disorder, chronic PTSD, panic attacks    Axis II: No diagnosis    Lindsay Register, LCSW 07/07/2020

## 2020-07-18 DIAGNOSIS — M545 Low back pain, unspecified: Secondary | ICD-10-CM | POA: Diagnosis not present

## 2020-07-18 DIAGNOSIS — M79604 Pain in right leg: Secondary | ICD-10-CM | POA: Diagnosis not present

## 2020-07-18 DIAGNOSIS — R200 Anesthesia of skin: Secondary | ICD-10-CM | POA: Diagnosis not present

## 2020-07-21 ENCOUNTER — Ambulatory Visit (INDEPENDENT_AMBULATORY_CARE_PROVIDER_SITE_OTHER): Payer: Medicare Other | Admitting: Licensed Clinical Social Worker

## 2020-07-21 DIAGNOSIS — F411 Generalized anxiety disorder: Secondary | ICD-10-CM

## 2020-07-21 DIAGNOSIS — F331 Major depressive disorder, recurrent, moderate: Secondary | ICD-10-CM

## 2020-07-21 DIAGNOSIS — F410 Panic disorder [episodic paroxysmal anxiety] without agoraphobia: Secondary | ICD-10-CM

## 2020-07-21 DIAGNOSIS — F4312 Post-traumatic stress disorder, chronic: Secondary | ICD-10-CM

## 2020-07-21 NOTE — Progress Notes (Signed)
Virtual Visit via Telephone Note  I connected with Lindsay [name] on 07/21/20 at  8:00 AM EDT by telephone and verified that I am speaking with the correct person using two identifiers.  Location: Patient: home Provider: home office   I discussed the limitations, risks, security and privacy concerns of performing an evaluation and management service by telephone and the availability of in person appointments. I also discussed with the patient that there may be a patient responsible charge related to this service. The patient expressed understanding and agreed to proceed.   I discussed the assessment and treatment plan with the patient. The patient was provided an opportunity to ask questions and all were answered. The patient agreed with the plan and demonstrated an understanding of the instructions.   The patient was advised to call back or seek an in-person evaluation if the symptoms worsen or if the condition fails to improve as anticipated.  I provided 20 minutes of non-face-to-face time during this encounter.  THERAPIST PROGRESS NOTE  Session Time: 8:00 AM to 8:20 AM  Participation Level: Active  Behavioral Response: CasualAlertDysphoric  Type of Therapy: Individual Therapy  Treatment Goals addressed:  help cope with health issues, anxiety, triggers, coping, utilize therapy as a way for patient to focus on working through current stressors that also helps to distract from pain. Interventions: Solution Focused, Strength-based, Supportive, and Other: coping  Summary: Lindsay [name] is a 50 y.o. female who presents with still has vertigo. Kidney bothering her bad, a lot of protein in uterus. PCP ordering lab work get that done next week. EMG done, being sent to neurologist for several strokes she has dizziness being referred for therapist vertigo treatment. In bed all the time because dizzy. Still in pain. Medical issue impact mental health, mental heath isn't healthy. Can't do mental  health exercises with all that is going on.  Therapist pointed out not resolving medical issues patient said even more going on now. With dizziness one doctor told her it was vertigo and one told her stroke. On the phone for three weeks. Talking to person. Met him in transportation. Lives on the phone. Cheers her up. He makes her happy.  Therapist pointed out it was good to have some positive news in session and that helps her and motivating her to get better as the has things to look forward to.  Wanted to explore more about this person she was talking to but phone was disconnected.  Did tell patient's good that while she is in bed that she has that connection to keep her busy and a positive focus.  Therapist reviewed symptoms, facilitated expression of thoughts and feelings, noted needing to address medical issues to help with mental health issues, therapist provided active listening open questions, supportive interventions.  Suicidal/Homicidal: No  Plan: Return again in 3 weeks.2.Therapist provided supportive and strength-based interventions work on strategies for coping  Diagnosis: Axis I: Major depressive disorder, recurrent, moderate, generalized anxiety disorder, chronic PTSD, panic attacks    Axis II: No diagnosis    [name], LCSW 07/21/2020

## 2020-07-24 ENCOUNTER — Other Ambulatory Visit: Payer: Self-pay | Admitting: Internal Medicine

## 2020-07-25 DIAGNOSIS — J9620 Acute and chronic respiratory failure, unspecified whether with hypoxia or hypercapnia: Secondary | ICD-10-CM | POA: Diagnosis not present

## 2020-07-28 ENCOUNTER — Other Ambulatory Visit: Payer: Self-pay | Admitting: Pulmonary Disease

## 2020-07-28 ENCOUNTER — Other Ambulatory Visit: Payer: Self-pay | Admitting: Gastroenterology

## 2020-07-28 DIAGNOSIS — R059 Cough, unspecified: Secondary | ICD-10-CM

## 2020-07-28 NOTE — Telephone Encounter (Signed)
Spoke to patient and scheduled rov with Geraldo Pitter NP on 7/26 at 10:15, will send in enough symbicort to last pt until next OFV.

## 2020-08-01 ENCOUNTER — Encounter: Payer: Self-pay | Admitting: Neurology

## 2020-08-01 ENCOUNTER — Encounter: Payer: Self-pay | Admitting: *Deleted

## 2020-08-01 ENCOUNTER — Ambulatory Visit: Payer: Self-pay | Admitting: Neurology

## 2020-08-01 ENCOUNTER — Telehealth: Payer: Self-pay | Admitting: Neurology

## 2020-08-01 NOTE — Addendum Note (Signed)
Addended by: Hulan Fray on: 08/01/2020 05:35 PM   Modules accepted: Orders

## 2020-08-01 NOTE — Telephone Encounter (Signed)
This is the second new patient no-show for this patient.  She will be discharged from our practice.

## 2020-08-02 ENCOUNTER — Encounter: Payer: Self-pay | Admitting: Neurology

## 2020-08-03 ENCOUNTER — Other Ambulatory Visit: Payer: Self-pay | Admitting: Pulmonary Disease

## 2020-08-05 DIAGNOSIS — J9620 Acute and chronic respiratory failure, unspecified whether with hypoxia or hypercapnia: Secondary | ICD-10-CM | POA: Diagnosis not present

## 2020-08-07 ENCOUNTER — Other Ambulatory Visit: Payer: Self-pay | Admitting: Internal Medicine

## 2020-08-07 DIAGNOSIS — E119 Type 2 diabetes mellitus without complications: Secondary | ICD-10-CM

## 2020-08-09 ENCOUNTER — Ambulatory Visit (INDEPENDENT_AMBULATORY_CARE_PROVIDER_SITE_OTHER): Payer: Medicare Other | Admitting: Licensed Clinical Social Worker

## 2020-08-09 DIAGNOSIS — F4312 Post-traumatic stress disorder, chronic: Secondary | ICD-10-CM | POA: Diagnosis not present

## 2020-08-09 DIAGNOSIS — F331 Major depressive disorder, recurrent, moderate: Secondary | ICD-10-CM

## 2020-08-09 DIAGNOSIS — F410 Panic disorder [episodic paroxysmal anxiety] without agoraphobia: Secondary | ICD-10-CM

## 2020-08-09 DIAGNOSIS — F411 Generalized anxiety disorder: Secondary | ICD-10-CM

## 2020-08-09 NOTE — Progress Notes (Signed)
Virtual Visit via Telephone Note  I connected with Lindsay [name] on 08/09/20 at 10:00 AM EDT by telephone and verified that I am speaking with the correct person using two identifiers.  Location: Patient: home Provider: home office   I discussed the limitations, risks, security and privacy concerns of performing an evaluation and management service by telephone and the availability of in person appointments. I also discussed with the patient that there may be a patient responsible charge related to this service. The patient expressed understanding and agreed to proceed.   I discussed the assessment and treatment plan with the patient. The patient was provided an opportunity to ask questions and all were answered. The patient agreed with the plan and demonstrated an understanding of the instructions.   The patient was advised to call back or seek an in-person evaluation if the symptoms worsen or if the condition fails to improve as anticipated.  I provided 30 minutes of non-face-to-face time during this encounter.  THERAPIST PROGRESS NOTE  Session Time: 10:00 AM to 10:30 AM  Participation Level: Active  Behavioral Response: CasualAlertDepressed  Type of Therapy: Individual Therapy  Treatment Goals addressed:  help cope with health issues, anxiety, triggers, coping, utilize therapy as a way for patient to focus on working through current stressors that also helps to distract from pain. Interventions: Solution Focused, Strength-based, Supportive, and Other: coping  Summary: Lindsay [name] is a 50 y.o. female who presents with in pain can't remember doctor's appointments except day of and day after. Know that in pain more lately. To her things feel  overwhelming hasn't done anything for mom and social security. Not remembering anything not getting anything done especially for herself. Has been dealing with vertigo for a long time. Not remembering don't know if tied to strokes but doctor  should not be reason for not remembering.  Therapist encouraged patient to write things down even use a simple formula such as a notebook marking off every day and highlighting so when she makes appointments she can look down and review because otherwise too difficult to remember if not written down, therapist herself realizes that and needs to write things down or will not remember.  Patient has appointment book but also willing to try therapist suggestion something simple for her to put in place. Patient shares she forget could look at My Chart for appointments.  Therapist expressed concern for patient's level of disorganization and problems with memory that therapist thought may indicate medical issues patient needs looked at.  Explored where patient needs to start first in addressing this.  Patient says she is going to call primary care and therapist thought this was a good place to start.  Patient says after this appointment she will call both pain clinic and primary care.  Therapist said that was positive for session to get patient into action and also to keep it simple given she is struggling with functioning, keep it simple to help her with functioning.  Need to call the pain clinic to see if she has an appointment. Wants to make sure to review with her heart doctor medications. Thinks she is supposed to see him in August. Blood pressure started to go high last few weeks and he told her to medications twice a day. Told her to see in two weeks and didn't make appointment. Therapist asked how she is feeling right now. She relates her stomach feels nauseas. Explored other topics such as talking to a guy she met in transportation. Helps  but doesn't take the pain away.  She asked what date it is and said she is post to meet him for his birthday.  Not sure if she is in good enough shape to see him today. Pain client is "[facility]" used to be [facility].  Therapist  look for the right words to express plan of action and patient came up with accurate expression plan to get her out of this "funk".  Patient also thinks to talk to psychiatrist as its related to cognitive issues may know something about why there is issues with memory, also he is a doctor that we will give him insight, therapist at the same time encourage patient to look for underlying medical issues that can play a part in cloudiness in thinking.  Therapist provided active listening, open questions, supportive interventions.  Suicidal/Homicidal: No  Plan: Return again in 1 week.2.  Therapist continue to process patient's feelings to help her cope with stressors, mood as well as encouraging proactive steps to help her in dressing issues, coping  Diagnosis: Axis I: Major depressive disorder, recurrent, moderate, generalized anxiety disorder, chronic PTSD, panic attacks    Axis II: No diagnosis    [name], LCSW 08/09/2020

## 2020-08-10 ENCOUNTER — Other Ambulatory Visit: Payer: Self-pay | Admitting: Internal Medicine

## 2020-08-10 DIAGNOSIS — E119 Type 2 diabetes mellitus without complications: Secondary | ICD-10-CM

## 2020-08-18 ENCOUNTER — Other Ambulatory Visit: Payer: Self-pay

## 2020-08-18 ENCOUNTER — Ambulatory Visit (HOSPITAL_COMMUNITY): Payer: Medicare Other | Admitting: Licensed Clinical Social Worker

## 2020-08-18 NOTE — Progress Notes (Signed)
Therapist contacted patient by phone for session and she did not respond. Session is a no show

## 2020-08-22 ENCOUNTER — Other Ambulatory Visit: Payer: Self-pay | Admitting: Gastroenterology

## 2020-08-22 ENCOUNTER — Ambulatory Visit: Payer: Medicare Other | Admitting: Primary Care

## 2020-08-23 ENCOUNTER — Other Ambulatory Visit: Payer: Self-pay | Admitting: Internal Medicine

## 2020-08-23 DIAGNOSIS — E559 Vitamin D deficiency, unspecified: Secondary | ICD-10-CM

## 2020-08-24 ENCOUNTER — Other Ambulatory Visit: Payer: Self-pay | Admitting: Pulmonary Disease

## 2020-08-24 DIAGNOSIS — R059 Cough, unspecified: Secondary | ICD-10-CM

## 2020-08-24 DIAGNOSIS — J9620 Acute and chronic respiratory failure, unspecified whether with hypoxia or hypercapnia: Secondary | ICD-10-CM | POA: Diagnosis not present

## 2020-08-28 ENCOUNTER — Other Ambulatory Visit: Payer: Self-pay | Admitting: Internal Medicine

## 2020-08-28 DIAGNOSIS — M797 Fibromyalgia: Secondary | ICD-10-CM | POA: Diagnosis not present

## 2020-08-28 DIAGNOSIS — G894 Chronic pain syndrome: Secondary | ICD-10-CM | POA: Diagnosis not present

## 2020-08-28 DIAGNOSIS — E1121 Type 2 diabetes mellitus with diabetic nephropathy: Secondary | ICD-10-CM

## 2020-08-28 DIAGNOSIS — G629 Polyneuropathy, unspecified: Secondary | ICD-10-CM | POA: Diagnosis not present

## 2020-08-28 DIAGNOSIS — M5459 Other low back pain: Secondary | ICD-10-CM | POA: Diagnosis not present

## 2020-08-29 MED ORDER — OZEMPIC (0.25 OR 0.5 MG/DOSE) 2 MG/1.5ML ~~LOC~~ SOPN: 0.5000 mg | PEN_INJECTOR | SUBCUTANEOUS | 0 refills | Status: DC

## 2020-08-29 NOTE — Telephone Encounter (Signed)
Approved Ozempic refill. I have increased her dose to 0.5 mg weekly. Please let patient know. We will continue to increase dose every 4 weeks if she is tolerating it well.

## 2020-08-30 ENCOUNTER — Other Ambulatory Visit: Payer: Self-pay | Admitting: Internal Medicine

## 2020-08-30 DIAGNOSIS — R42 Dizziness and giddiness: Secondary | ICD-10-CM

## 2020-08-31 ENCOUNTER — Other Ambulatory Visit: Payer: Self-pay | Admitting: Internal Medicine

## 2020-08-31 DIAGNOSIS — E1121 Type 2 diabetes mellitus with diabetic nephropathy: Secondary | ICD-10-CM

## 2020-09-01 ENCOUNTER — Other Ambulatory Visit (HOSPITAL_COMMUNITY): Payer: Self-pay | Admitting: Psychiatry

## 2020-09-01 ENCOUNTER — Ambulatory Visit (INDEPENDENT_AMBULATORY_CARE_PROVIDER_SITE_OTHER): Payer: Medicare Other | Admitting: Licensed Clinical Social Worker

## 2020-09-01 DIAGNOSIS — F4312 Post-traumatic stress disorder, chronic: Secondary | ICD-10-CM

## 2020-09-01 DIAGNOSIS — F411 Generalized anxiety disorder: Secondary | ICD-10-CM

## 2020-09-01 DIAGNOSIS — F410 Panic disorder [episodic paroxysmal anxiety] without agoraphobia: Secondary | ICD-10-CM

## 2020-09-01 DIAGNOSIS — F331 Major depressive disorder, recurrent, moderate: Secondary | ICD-10-CM | POA: Diagnosis not present

## 2020-09-01 NOTE — Progress Notes (Signed)
Virtual Visit via Telephone Note  I connected with [name] on 09/01/20 at  9:00 AM EDT by telephone and verified that I am speaking with the correct person using two identifiers.  Location: Patient: home Provider: home office   I discussed the limitations, risks, security and privacy concerns of performing an evaluation and management service by telephone and the availability of in person appointments. I also discussed with the patient that there may be a patient responsible charge related to this service. The patient expressed understanding and agreed to proceed.  I discussed the assessment and treatment plan with the patient. The patient was provided an opportunity to ask questions and all were answered. The patient agreed with the plan and demonstrated an understanding of the instructions.   The patient was advised to call back or seek an in-person evaluation if the symptoms worsen or if the condition fails to improve as anticipated.  I provided 40 minutes of non-face-to-face time during this encounter.  THERAPIST PROGRESS NOTE  Session Time: 8:00 AM to 8:40 AM  Participation Level: Active  Behavioral Response: CasualAlertDysphoric  Type of Therapy: Individual Therapy  Treatment Goals addressed:  help cope with health issues, anxiety, triggers, coping, utilize therapy as a way for patient to focus on working through current stressors that also helps to distract from pain. Interventions: Solution Focused, Strength-based, Supportive, and Other: coping  Summary: Nayomi Tabron is a 50 y.o. female who presents with vertigo and can't go anyway. Can't get balanced, can't focus hard to lay down. Didn't go to primary because of the vertigo and had to get tests from the pain doctor. Told her has a little neuropathy wrote a prescription for Tramadol. Hasn't seen a difference yet but only went to the doctor on Monday so still only a few days. Taking Meclizine for vertigo. Because of  dizziness and can't balance feels like want to throw up. Last couple days not so bad with dizziness but had a headache and hasn't had since 2018. Has a migraine. Keep head covered up most of the time. Got to paperwork for mom's social security. Talking to a guy, [name], two months on the 3rd, met him in transportation. Can talk to him 14-15 hours a day. Didn't think would be in love again don't think ever been in love. Met up with him on his birthday. Look forward enjoying time with him. He wants her to get out of the house and therapist said she wants her to get out of the house as well so he seems to have a positive influence on her in helping with mood which is one of the goals for therapy.   Therapist reviewed symptoms, facilitated expression of thoughts and feelings utilize processing of feelings to help patient work through current stressors significantly medical issues that she continues to deal with.  Noted possible helpfulness of going to a pain doctor and getting on pain medication, although still need to assess as she is recently started.  Focused on positive as a helpful intervention focusing on relationship that has been very positive for her and relates to her wellbeing as it impacts her mood enjoys talking to him and looking forward to spending time with him.  Therapist continues to encourage patient to use resources to address medical issues.  Noted positive as well as patient completing task despite struggles with medical issues.  Therapist provided active listening, open questions, supportive interventions.     Suicidal/Homicidal: No  Plan: Return again in 1 week.2.Therapist continue  to process patient's feelings to help her cope with stressors, mood as well as encouraging proactive steps to help her in dressing issues, coping  Diagnosis: Axis I: Major depressive disorder, recurrent, moderate, generalized anxiety disorder, chronic PTSD, panic attacks    Axis II: No diagnosis    [name], LCSW 09/01/2020

## 2020-09-02 ENCOUNTER — Other Ambulatory Visit (HOSPITAL_COMMUNITY): Payer: Self-pay | Admitting: Psychiatry

## 2020-09-02 DIAGNOSIS — F331 Major depressive disorder, recurrent, moderate: Secondary | ICD-10-CM

## 2020-09-05 ENCOUNTER — Other Ambulatory Visit: Payer: Self-pay | Admitting: Pulmonary Disease

## 2020-09-05 DIAGNOSIS — J9620 Acute and chronic respiratory failure, unspecified whether with hypoxia or hypercapnia: Secondary | ICD-10-CM | POA: Diagnosis not present

## 2020-09-06 ENCOUNTER — Ambulatory Visit: Payer: Medicare Other | Admitting: Primary Care

## 2020-09-08 ENCOUNTER — Other Ambulatory Visit: Payer: Self-pay

## 2020-09-08 ENCOUNTER — Ambulatory Visit (HOSPITAL_COMMUNITY): Payer: Medicare Other | Admitting: Licensed Clinical Social Worker

## 2020-09-08 DIAGNOSIS — N189 Chronic kidney disease, unspecified: Secondary | ICD-10-CM | POA: Diagnosis not present

## 2020-09-08 DIAGNOSIS — F331 Major depressive disorder, recurrent, moderate: Secondary | ICD-10-CM

## 2020-09-08 DIAGNOSIS — F410 Panic disorder [episodic paroxysmal anxiety] without agoraphobia: Secondary | ICD-10-CM

## 2020-09-08 DIAGNOSIS — R0609 Other forms of dyspnea: Secondary | ICD-10-CM | POA: Diagnosis not present

## 2020-09-08 DIAGNOSIS — I5042 Chronic combined systolic (congestive) and diastolic (congestive) heart failure: Secondary | ICD-10-CM | POA: Diagnosis not present

## 2020-09-08 DIAGNOSIS — E785 Hyperlipidemia, unspecified: Secondary | ICD-10-CM | POA: Diagnosis not present

## 2020-09-08 DIAGNOSIS — F411 Generalized anxiety disorder: Secondary | ICD-10-CM

## 2020-09-08 DIAGNOSIS — F4312 Post-traumatic stress disorder, chronic: Secondary | ICD-10-CM

## 2020-09-08 DIAGNOSIS — J449 Chronic obstructive pulmonary disease, unspecified: Secondary | ICD-10-CM | POA: Diagnosis not present

## 2020-09-08 DIAGNOSIS — E119 Type 2 diabetes mellitus without complications: Secondary | ICD-10-CM | POA: Diagnosis not present

## 2020-09-08 DIAGNOSIS — I10 Essential (primary) hypertension: Secondary | ICD-10-CM | POA: Diagnosis not present

## 2020-09-08 NOTE — Progress Notes (Signed)
  Virtual Visit via Telephone Note  I connected with [name] on 09/08/20 at  8:00 AM EDT by telephone and verified that I am speaking with the correct person using two identifiers.  Location: Patient: home Provider: home office   I discussed the limitations, risks, security and privacy concerns of performing an evaluation and management service by telephone and the availability of in person appointments. I also discussed with the patient that there may be a patient responsible charge related to this service. The patient expressed understanding and agreed to proceed.   I discussed the assessment and treatment plan with the patient. The patient was provided an opportunity to ask questions and all were answered. The patient agreed with the plan and demonstrated an understanding of the instructions.   The patient was advised to call back or seek an in-person evaluation if the symptoms worsen or if the condition fails to improve as anticipated.  I provided 5 minutes of non-face-to-face time during this encounter.  THERAPIST PROGRESS NOTE  Session Time: 8:00 AM to 8:05 AM  Participation Level: Active  Behavioral Response: CasualAlertDysphoric  Type of Therapy: Individual Therapy  Treatment Goals addressed:  help cope with health issues, anxiety, triggers, coping, utilize therapy as a way for patient to focus on working through current stressors that also helps to distract from pain. Interventions: Solution Focused, Strength-based, and Supportive  Summary: Lindsay [name] is a 50 y.o. female who presents with not good, doesn't know what fainting feels like but head dizzy not too long ago, freaked out and trying to recover from that. Has appointment with heart doctor. Head woozy, even talking about it makes her have panic attack.Therapist encouraged her to breath to help the panic and patient said she did take a deep breath. Doesn't feel well and waiting for transportation so she wanted to  end session early. Therapist understands not easy to talk and have session when not feeling well.       Suicidal/Homicidal: No  Plan: Return again in 2 weeks.2.Therapist continue to process patient's feelings to help her cope with stressors, help with mood as well as encouraging proactive steps to help her in addressing her issues, coping  Diagnosis: Axis I: Major depressive disorder, recurrent, moderate, generalized anxiety disorder, chronic PTSD, panic attacks    Axis II: No diagnosis    [name], LCSW 09/08/2020

## 2020-09-11 ENCOUNTER — Encounter (HOSPITAL_COMMUNITY): Payer: Self-pay | Admitting: Psychiatry

## 2020-09-11 ENCOUNTER — Other Ambulatory Visit: Payer: Self-pay

## 2020-09-11 ENCOUNTER — Telehealth (INDEPENDENT_AMBULATORY_CARE_PROVIDER_SITE_OTHER): Payer: Medicare Other | Admitting: Psychiatry

## 2020-09-11 DIAGNOSIS — F410 Panic disorder [episodic paroxysmal anxiety] without agoraphobia: Secondary | ICD-10-CM

## 2020-09-11 DIAGNOSIS — F331 Major depressive disorder, recurrent, moderate: Secondary | ICD-10-CM

## 2020-09-11 DIAGNOSIS — F4312 Post-traumatic stress disorder, chronic: Secondary | ICD-10-CM

## 2020-09-11 MED ORDER — REXULTI 3 MG PO TABS: 3.0000 mg | ORAL_TABLET | Freq: Every day | ORAL | 2 refills | Status: DC

## 2020-09-11 MED ORDER — CLORAZEPATE DIPOTASSIUM 3.75 MG PO TABS: 3.7500 mg | ORAL_TABLET | Freq: Every day | ORAL | 2 refills | Status: DC

## 2020-09-11 MED ORDER — MIRTAZAPINE 15 MG PO TABS: 15.0000 mg | ORAL_TABLET | Freq: Every day | ORAL | 0 refills | Status: DC

## 2020-09-11 MED ORDER — LAMOTRIGINE 150 MG PO TABS: 150.0000 mg | ORAL_TABLET | Freq: Every day | ORAL | 0 refills | Status: DC
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
## 2020-09-19 ENCOUNTER — Other Ambulatory Visit: Payer: Self-pay | Admitting: Internal Medicine

## 2020-09-19 DIAGNOSIS — E1121 Type 2 diabetes mellitus with diabetic nephropathy: Secondary | ICD-10-CM

## 2020-09-21 ENCOUNTER — Other Ambulatory Visit: Payer: Self-pay | Admitting: Internal Medicine

## 2020-09-21 ENCOUNTER — Telehealth: Payer: Self-pay | Admitting: Gastroenterology

## 2020-09-21 ENCOUNTER — Other Ambulatory Visit: Payer: Self-pay

## 2020-09-21 DIAGNOSIS — M797 Fibromyalgia: Secondary | ICD-10-CM

## 2020-09-21 MED ORDER — PROMETHAZINE HCL 12.5 MG PO TABS
12.5000 mg | ORAL_TABLET | Freq: Four times a day (QID) | ORAL | 0 refills | Status: DC | PRN
Start: 2020-09-21 — End: 2020-10-13

## 2020-09-21 NOTE — Telephone Encounter (Signed)
Rx for promethazine sent to pharmacy as requested.  Patient will be given additional refills at appointment scheduled for 10-25-2020 with [facility] if needed.

## 2020-09-21 NOTE — Telephone Encounter (Signed)
Pt just made an appt on 9/27 with APP/ she is requesting rf for Promethazine. Her pharmacy is [facility] on [address]. Pt advised to keep appt.

## 2020-09-22 ENCOUNTER — Other Ambulatory Visit: Payer: Self-pay | Admitting: Internal Medicine

## 2020-09-22 ENCOUNTER — Ambulatory Visit (INDEPENDENT_AMBULATORY_CARE_PROVIDER_SITE_OTHER): Payer: Medicare Other | Admitting: Licensed Clinical Social Worker

## 2020-09-22 DIAGNOSIS — F4312 Post-traumatic stress disorder, chronic: Secondary | ICD-10-CM

## 2020-09-22 DIAGNOSIS — F331 Major depressive disorder, recurrent, moderate: Secondary | ICD-10-CM

## 2020-09-22 DIAGNOSIS — F410 Panic disorder [episodic paroxysmal anxiety] without agoraphobia: Secondary | ICD-10-CM

## 2020-09-22 DIAGNOSIS — F411 Generalized anxiety disorder: Secondary | ICD-10-CM

## 2020-09-22 NOTE — Progress Notes (Signed)
Virtual Visit via Telephone Note  I connected with Lindsay [name] on 09/22/20 at  8:00 AM EDT by telephone and verified that I am speaking with the correct person using two identifiers.  Location: Patient: home Provider: home office   I discussed the limitations, risks, security and privacy concerns of performing an evaluation and management service by telephone and the availability of in person appointments. I also discussed with the patient that there may be a patient responsible charge related to this service. The patient expressed understanding and agreed to proceed.   I discussed the assessment and treatment plan with the patient. The patient was provided an opportunity to ask questions and all were answered. The patient agreed with the plan and demonstrated an understanding of the instructions.   The patient was advised to call back or seek an in-person evaluation if the symptoms worsen or if the condition fails to improve as anticipated.  I provided 50 minutes of non-face-to-face time during this encounter.   THERAPIST PROGRESS NOTE  Session Time: 8:00 AM to 8:50 AM  Participation Level: Active  Behavioral Response: CasualAlertAnxious and Depressed  Type of Therapy: Individual Therapy  Treatment Goals addressed:  help cope with health issues, anxiety, triggers, coping, utilize therapy as a way for patient to focus on working through current stressors that also helps to distract from pain. Interventions: Solution Focused, Strength-based, Supportive, and Other: coping  Summary: Lindsay [name] is a 50 y.o. female who presents with since know therapist and had therapist look at treatment history-2 years has been in excruciating pain. Even before started with therapist had pain. Per patient, Dr. Billey Chang says she has chronic and severe pain. Pain clinic giving her Tramadol but doesn't help. Patient said he is rude and doesn't like her. Rude to PCP. People up front tell her can't get  anyone else. PCP say can get someone else, patient doesn't want to be him, rude to her, the office says she can't  Explored going to other clinics. First one violated HIPAA law, second one didn't accept insurance, third referral was this office. Feels like nobody is doing anything about it. Back against the wall come out swinging. Down lately, now energy to come out swinging and don't want to have anyone give her any trouble. Feels like body half dead right now pain, feels like body hit by a train. Will call internal medicine maybe skipping over something. Can't stand up even to get to the bathroom. With medicines impacts sexual functioning. Lot of things going on. Talked about relationship and with pain can't really go anyway. Heart doctor told him to get ultra sound to see if fluid around heart. Out of breath from pain in legs, knees, pain in bent of left leg, pain running from toes to knees, numb, tingling needles in it, pain from lower back down to "butt cheek" also body impacted from heart. Still dizzy. Kidney failure pain, migraines.  On medication for dizziness but not helping. Can't force herself to go out to eat with boyfriend afraid will fall. Can't pull herself up. Even if she tries to put up with pain, he can't take her out. Try to live a normal life and feels can't, can't do anything even go to bathroom. Hopes not fluid in heart but out of breath and needs to find out why. Cry everyday about physical issues, it is depressing. Nobody is helping, talked about switching doctors for example going to her heart doctor, patient relates she has so many doctors under  [facility]- lung, GI, therapy would be hard to switch, Reviewed what she got from session and she relates it gives her balance, gets it off chest, would have lashed out with anger. Talked about boyfriend and that opportunity gives her motivation to speak out to get help. His name is Lindsay [name]. He is interesting, funny. Every day patient lives on the phone.  September 3 it has been three months. Reviewed patient's motivation that she is going to make some phone calls today.    Therapist reviewed symptoms, facilitated expression of thoughts and feelings and utilize this as important treatment intervention to work through feelings related to pain issues.  Therapist validated patient how she was feeling.  Assessed session motivating as well recognizing has had pain in a long time and needs someone to do something about it and also has a relationship that she wants to enjoy as another incentive.  Developed plan during session and felt helpful by end of session patient was can make a couple calls to fight for getting the help she needs.  Noted therapy helpful for putting words to her own feelings and thoughts developing a language provides meaning and this helps as well with understanding her cells and ways we can cope, providing meaning with insight can be a channel for managing symptoms.  Therapist provided active listening open questions, supportive interventions. Suicidal/Homicidal: No  Plan: Return again in  2 weeks.2.  Continue to provide strength-based and supportive interventions as patient is struggling with a lot of medical issues continuing her to encourage her to speak up for what she needs, coping  Diagnosis: Axis I: Major depressive disorder, recurrent, moderate, generalized anxiety disorder, chronic PTSD, panic attacks   Axis II: No diagnosis    Lindsay Register, LCSW 09/22/2020

## 2020-09-24 ENCOUNTER — Other Ambulatory Visit: Payer: Self-pay | Admitting: Pulmonary Disease

## 2020-09-24 DIAGNOSIS — J9620 Acute and chronic respiratory failure, unspecified whether with hypoxia or hypercapnia: Secondary | ICD-10-CM | POA: Diagnosis not present

## 2020-09-24 DIAGNOSIS — R059 Cough, unspecified: Secondary | ICD-10-CM

## 2020-09-25 ENCOUNTER — Telehealth: Payer: Self-pay | Admitting: Pulmonary Disease

## 2020-09-25 ENCOUNTER — Other Ambulatory Visit: Payer: Self-pay | Admitting: Internal Medicine

## 2020-09-25 DIAGNOSIS — I5023 Acute on chronic systolic (congestive) heart failure: Secondary | ICD-10-CM

## 2020-09-25 MED ORDER — ALBUTEROL SULFATE (2.5 MG/3ML) 0.083% IN NEBU
2.5000 mg | INHALATION_SOLUTION | RESPIRATORY_TRACT | 0 refills | Status: DC | PRN
Start: 2020-09-25 — End: 2021-07-11

## 2020-09-25 MED ORDER — LIDOCAINE 5 % EX OINT: 1.0000 "application " | TOPICAL_OINTMENT | CUTANEOUS | 0 refills | Status: DC | PRN

## 2020-09-25 NOTE — Telephone Encounter (Signed)
There is no other way to figure out whether one has COVID unless you get tested -With mild disease the treatment is still supportive care -Medications for COVID can only be started if you test positive  Over-the-counter cough medications for symptom management are still the most appropriate-medications for cough and sinus congestion

## 2020-09-25 NOTE — Telephone Encounter (Signed)
Patient is aware of recommendations and voiced her understanding.  Nothing further needed at this time.  

## 2020-09-25 NOTE — Telephone Encounter (Signed)
Approved lidocaine ointment refill request. Please have patient schedule in person follow up with me next available. Thanks!

## 2020-09-25 NOTE — Telephone Encounter (Signed)
Spoke with the pt  She is c/o increased cough, SOB and nasal congestion- onset- 4 days ago  She states she feels fatigued and has body aches which she relates to violent coughing spells  Cough has been non prod and she denies any fever or wheezing  She states she is unable to afford covid test and has no transportation to go to a testing site  She had appt for tomorrow but she cancelled this due to being sick and wondering if it's covid  She is still taking symbicort, out of albuterol neb sol so I refilled this  Please advise thanks

## 2020-09-26 ENCOUNTER — Ambulatory Visit: Payer: Medicare Other | Admitting: Primary Care

## 2020-09-26 MED ORDER — GABAPENTIN 300 MG PO CAPS: 900.0000 mg | ORAL_CAPSULE | Freq: Every day | ORAL | 0 refills | Status: DC

## 2020-09-26 NOTE — Addendum Note (Signed)
Addended by: [name] on: 09/26/2020 02:11 PM   Modules accepted: Orders

## 2020-09-26 NOTE — Telephone Encounter (Signed)
Gabapentin was refilled as "No Print" - please re-send  electronically. Thanks

## 2020-09-28 ENCOUNTER — Telehealth: Payer: Self-pay

## 2020-09-28 NOTE — Telephone Encounter (Signed)
#  90 with 1 refill sent to [facility] on 07/25/20. Attempted to relay this to patient. No answer and [facility] is full.

## 2020-09-28 NOTE — Telephone Encounter (Signed)
atorvastatin (LIPITOR) 40 MG tablet, REFILL REQUEST @ [id number]- [place], [place] - [address]

## 2020-09-29 DIAGNOSIS — M797 Fibromyalgia: Secondary | ICD-10-CM | POA: Diagnosis not present

## 2020-09-29 DIAGNOSIS — G894 Chronic pain syndrome: Secondary | ICD-10-CM | POA: Diagnosis not present

## 2020-09-29 DIAGNOSIS — M25569 Pain in unspecified knee: Secondary | ICD-10-CM | POA: Diagnosis not present

## 2020-09-29 DIAGNOSIS — G629 Polyneuropathy, unspecified: Secondary | ICD-10-CM | POA: Diagnosis not present

## 2020-10-03 ENCOUNTER — Other Ambulatory Visit (HOSPITAL_COMMUNITY): Payer: Self-pay | Admitting: Psychiatry

## 2020-10-03 DIAGNOSIS — F331 Major depressive disorder, recurrent, moderate: Secondary | ICD-10-CM

## 2020-10-04 ENCOUNTER — Other Ambulatory Visit: Payer: Self-pay

## 2020-10-04 ENCOUNTER — Emergency Department (HOSPITAL_COMMUNITY)
Admission: EM | Admit: 2020-10-04 | Discharge: 2020-10-04 | Disposition: A | Discharge status: Home / Self Care | Payer: Medicare Other | Attending: Emergency Medicine | Admitting: Emergency Medicine

## 2020-10-04 ENCOUNTER — Emergency Department (HOSPITAL_COMMUNITY): Payer: Medicare Other

## 2020-10-04 DIAGNOSIS — J449 Chronic obstructive pulmonary disease, unspecified: Secondary | ICD-10-CM | POA: Insufficient documentation

## 2020-10-04 DIAGNOSIS — M545 Low back pain, unspecified: Secondary | ICD-10-CM | POA: Diagnosis not present

## 2020-10-04 DIAGNOSIS — Z5321 Procedure and treatment not carried out due to patient leaving prior to being seen by health care provider: Secondary | ICD-10-CM | POA: Diagnosis not present

## 2020-10-04 DIAGNOSIS — I509 Heart failure, unspecified: Secondary | ICD-10-CM | POA: Diagnosis not present

## 2020-10-04 DIAGNOSIS — R0602 Shortness of breath: Secondary | ICD-10-CM | POA: Insufficient documentation

## 2020-10-04 DIAGNOSIS — R079 Chest pain, unspecified: Secondary | ICD-10-CM | POA: Diagnosis not present

## 2020-10-04 DIAGNOSIS — I517 Cardiomegaly: Secondary | ICD-10-CM | POA: Diagnosis not present

## 2020-10-04 DIAGNOSIS — I10 Essential (primary) hypertension: Secondary | ICD-10-CM | POA: Diagnosis not present

## 2020-10-04 LAB — BASIC METABOLIC PANEL
Anion gap: 6 (ref 5–15)
BUN: 5 mg/dL — ABNORMAL LOW (ref 6–20)
CO2: 27 mmol/L (ref 22–32)
Calcium: 9 mg/dL (ref 8.9–10.3)
Chloride: 105 mmol/L (ref 98–111)
Creatinine, Ser: 0.8 mg/dL (ref 0.44–1.00)
GFR, Estimated: >60 mL/min (ref >60)
Glucose, Bld: 98 mg/dL (ref 70–99)
Potassium: 4.2 mmol/L (ref 3.5–5.1)
Sodium: 138 mmol/L (ref 135–145)

## 2020-10-04 LAB — CBC
HCT: 37.4 % (ref 36.0–46.0)
Hemoglobin: 11.5 g/dL — ABNORMAL LOW (ref 12.0–15.0)
MCH: 26 pg (ref 26.0–34.0)
MCHC: 30.7 g/dL (ref 30.0–36.0)
MCV: 84.6 fL (ref 80.0–100.0)
Platelets: 483 10*3/uL — ABNORMAL HIGH (ref 150–400)
RBC: 4.42 MIL/uL (ref 3.87–5.11)
RDW: 17.8 % — ABNORMAL HIGH (ref 11.5–15.5)
WBC: 9.2 10*3/uL (ref 4.0–10.5)
nRBC: 0 % (ref 0.0–0.2)

## 2020-10-04 LAB — I-STAT BETA HCG BLOOD, ED (MC, WL, AP ONLY): I-stat hCG, quantitative: <5 m[IU]/mL (ref <5)

## 2020-10-04 LAB — BRAIN NATRIURETIC PEPTIDE: B Natriuretic Peptide: 26.7 pg/mL (ref 0.0–100.0)

## 2020-10-04 IMAGING — DX DG CHEST 2V
2 series · 2 of 2 positions shown · non-contrast
Comparison: Chest radiograph [DATE]

CLINICAL DATA: Shortness of breath, left-sided chest pain

EXAM:
CHEST - 2 VIEW

[chest lat]
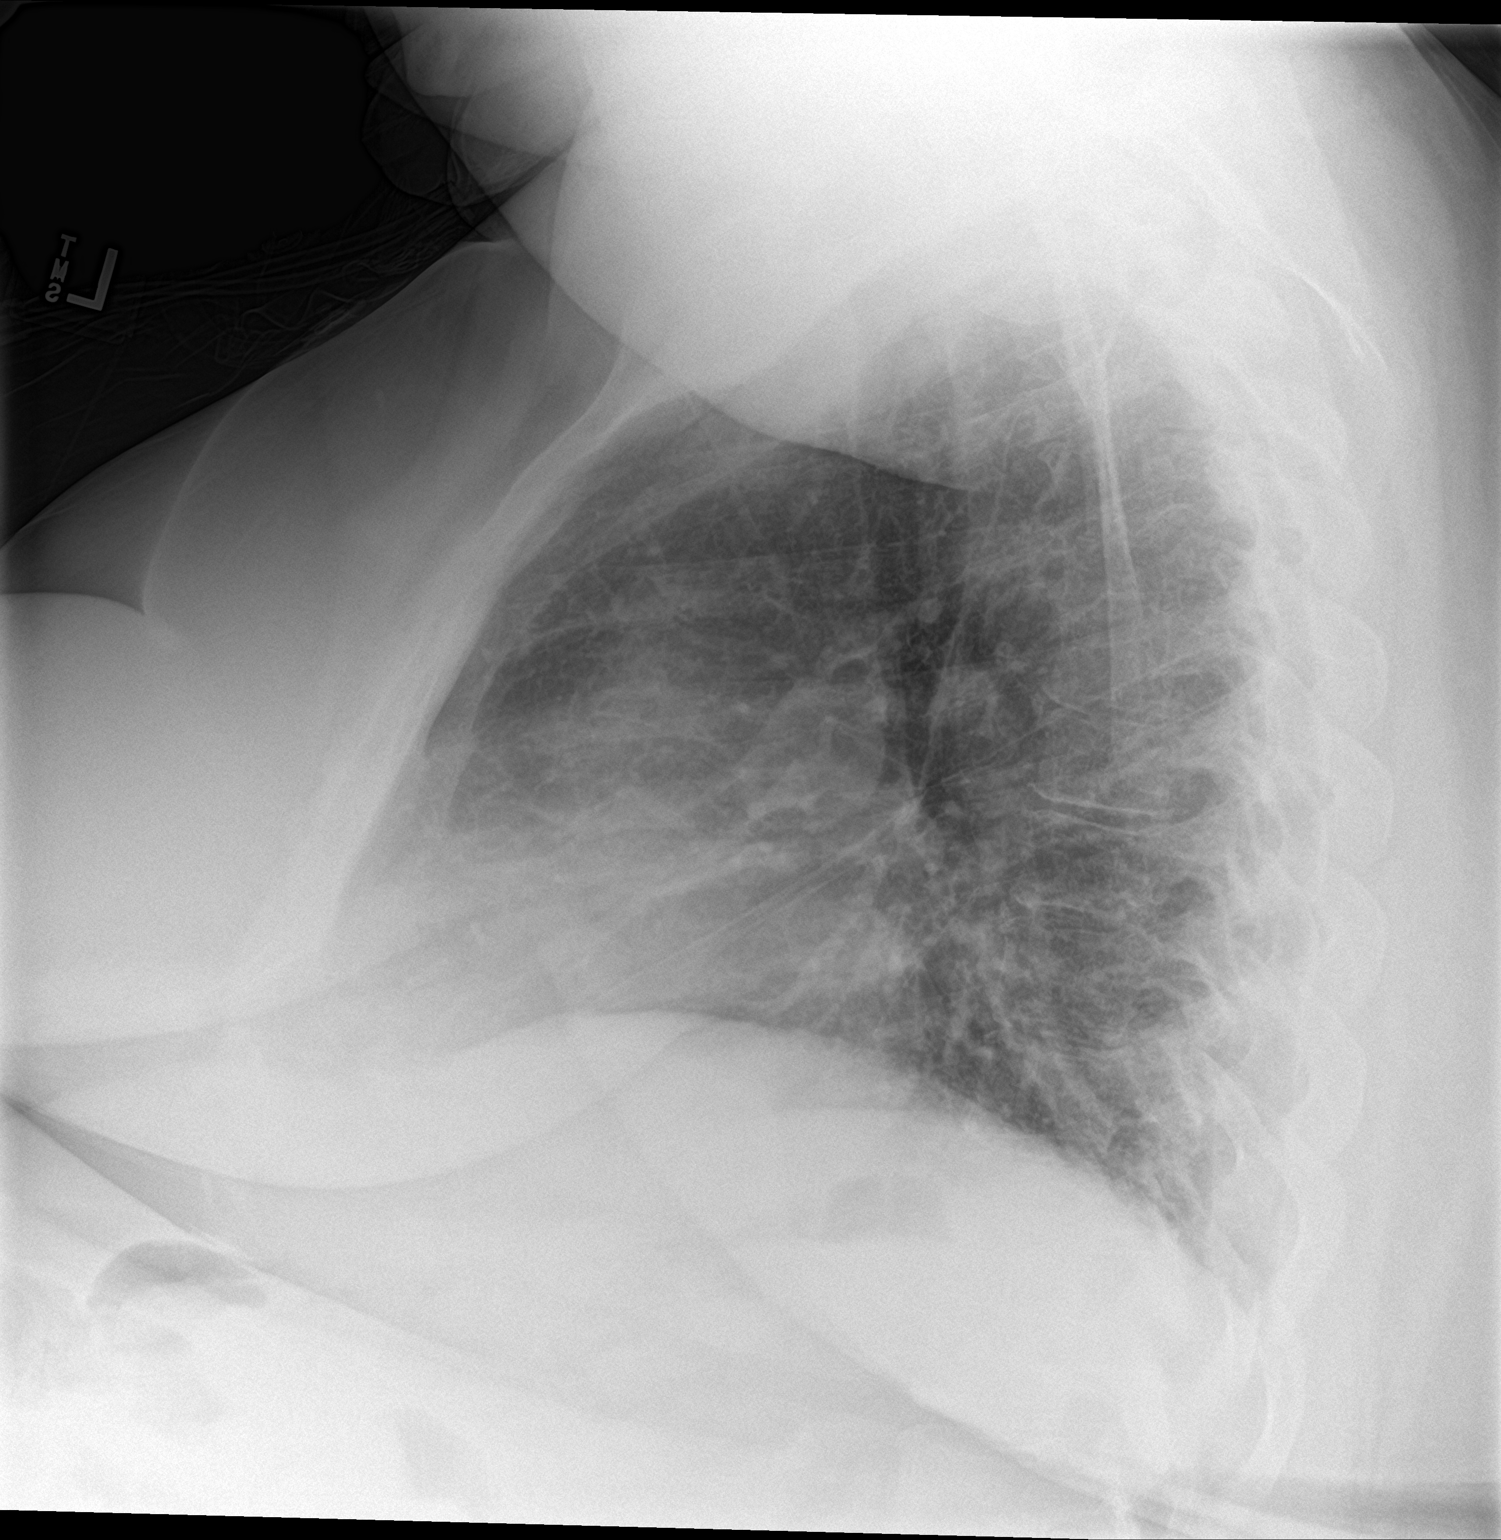

[chest ap]
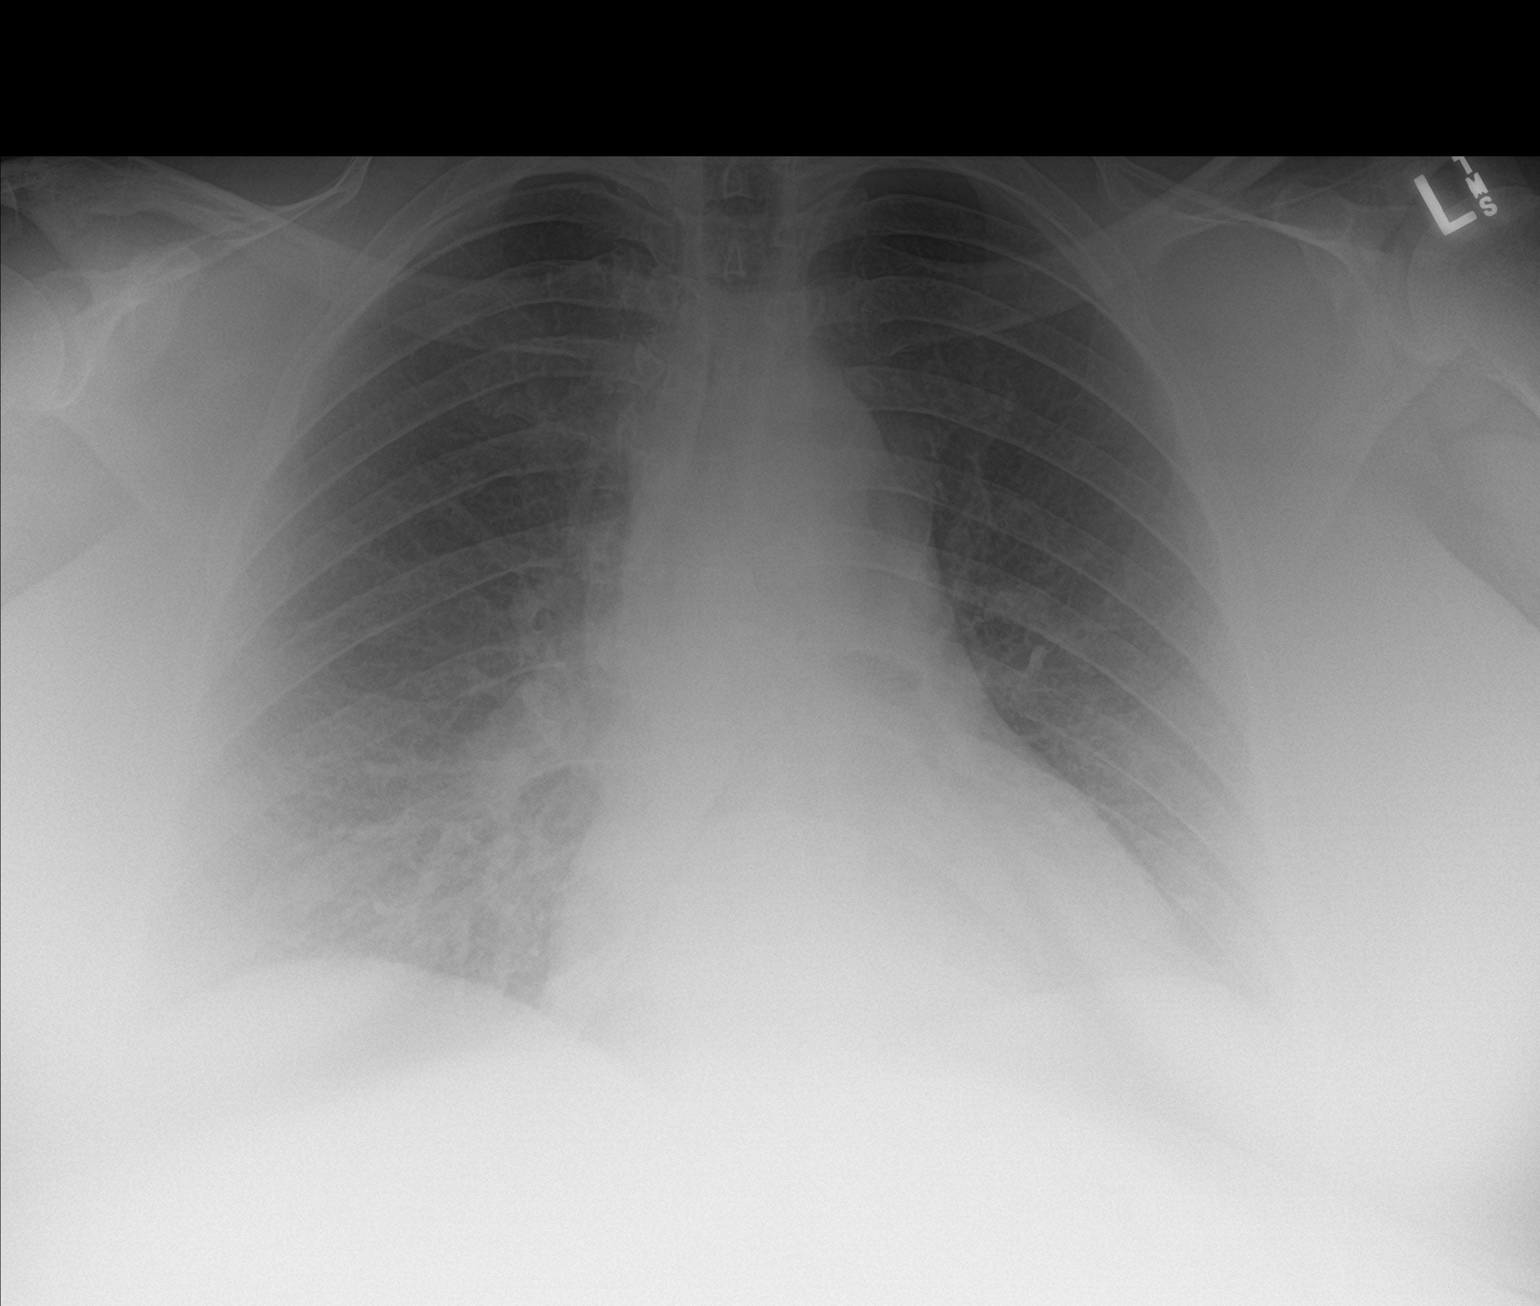

[2 of 2 positions shown; findings below may reference images not displayed]

FINDINGS: The heart is mildly enlarged. The mediastinal contours are within
normal limits. There is prominence of the right pulmonary artery,
similar to the prior study.

There is no focal consolidation or pulmonary edema. There is no
pleural effusion or pneumothorax.

There is no acute osseous abnormality.
IMPRESSION: Borderline cardiomegaly with enlargement of the pulmonary
vasculature raising the possibility of pulmonary hypertension, as
seen on prior CTA chest. Otherwise, no radiographic evidence of
acute cardiopulmonary process.

## 2020-10-04 NOTE — ED Provider Notes (Signed)
Emergency Medicine Provider Triage Evaluation Note  Lindsay [name] , a 50 y.o. female  was evaluated in triage.  Pt complains of shortness of breath over the last 4 days.  Has a history of congestive heart failure most recent exacerbation 1 year ago.  This feels identical to that episode.  Review of Systems  Positive: Left lower back pain, leg swelling Negative: Chest pain, fever, chills, nausea, vomiting  Physical Exam  BP 140/85 (BP Location: Left Wrist)   Pulse 100   Temp 98.2 F (36.8 C)   Resp 18   SpO2 97%  Gen:   Awake, no distress, obese   Resp:  Normal effort, decreased lung sounds heard throughout bilateral lung fields.  Difficult to appreciate secondary to body habitus. MSK:   Moves extremities without difficulty  Other:  1+ pitting edema bilaterally, heart sounds are regular, left lower lumbar tenderness  Medical Decision Making  Medically screening exam initiated at 1:05 PM.  Appropriate orders placed.  [name] was informed that the remainder of the evaluation will be completed by another provider, this initial triage assessment does not replace that evaluation, and the importance of remaining in the ED until their evaluation is complete.     Myna Bright Stanford, PA-C 10/04/20 1312    Carmin Muskrat, MD 10/04/20 1455

## 2020-10-04 NOTE — ED Triage Notes (Signed)
Pt here from home with c/o sob and back pian to the left side , pt is on 5 liters otc /hx of copd and chf

## 2020-10-06 ENCOUNTER — Telehealth (HOSPITAL_COMMUNITY): Payer: Self-pay | Admitting: *Deleted

## 2020-10-06 ENCOUNTER — Telehealth: Payer: Self-pay

## 2020-10-06 ENCOUNTER — Ambulatory Visit (INDEPENDENT_AMBULATORY_CARE_PROVIDER_SITE_OTHER): Payer: Medicare Other | Admitting: Licensed Clinical Social Worker

## 2020-10-06 DIAGNOSIS — F4312 Post-traumatic stress disorder, chronic: Secondary | ICD-10-CM

## 2020-10-06 DIAGNOSIS — F331 Major depressive disorder, recurrent, moderate: Secondary | ICD-10-CM | POA: Diagnosis not present

## 2020-10-06 DIAGNOSIS — F410 Panic disorder [episodic paroxysmal anxiety] without agoraphobia: Secondary | ICD-10-CM

## 2020-10-06 DIAGNOSIS — J9620 Acute and chronic respiratory failure, unspecified whether with hypoxia or hypercapnia: Secondary | ICD-10-CM | POA: Diagnosis not present

## 2020-10-06 DIAGNOSIS — F411 Generalized anxiety disorder: Secondary | ICD-10-CM | POA: Diagnosis not present

## 2020-10-06 MED ORDER — ATORVASTATIN CALCIUM 40 MG PO TABS: 40.0000 mg | ORAL_TABLET | Freq: Every day | ORAL | 1 refills | Status: DC

## 2020-10-06 MED ORDER — MIRTAZAPINE 30 MG PO TABS: 30.0000 mg | ORAL_TABLET | Freq: Every day | ORAL | 1 refills | Status: DC

## 2020-10-06 NOTE — Telephone Encounter (Signed)
Dose increased to 30 mg and sent to [facility] [address] with a 30-day supply and 1 additional refill.

## 2020-10-06 NOTE — Telephone Encounter (Signed)
I don't see any notes about increasing her to 2 tablets. There was a telephone note back in March where I changed her metformin back to 500 mg tablets and told her to take 2. She can resume taking just 1 tablet of the atorvastatin- I will send in a new prescription. Plan to recheck her lipids at follow up.

## 2020-10-06 NOTE — Telephone Encounter (Signed)
Pt states the pharmacy needs new Rx for atorvastatin (LIPITOR) 40 MG tablet. Please call back.

## 2020-10-06 NOTE — Telephone Encounter (Signed)
#  90 with 1 refill sent 6/28 by PCP. Call placed to patient. States PCP told her to take 2 tabs daily so now she is out. Please send new Rx. Thank you.

## 2020-10-06 NOTE — Progress Notes (Signed)
Virtual Visit via Telephone Note  I connected with Lindsay [name] on 10/06/20 at  8:00 AM EDT by telephone and verified that I am speaking with the correct person using two identifiers.  Location: Patient: home Provider: home office   I discussed the limitations, risks, security and privacy concerns of performing an evaluation and management service by telephone and the availability of in person appointments. I also discussed with the patient that there may be a patient responsible charge related to this service. The patient expressed understanding and agreed to proceed.   I discussed the assessment and treatment plan with the patient. The patient was provided an opportunity to ask questions and all were answered. The patient agreed with the plan and demonstrated an understanding of the instructions.   The patient was advised to call back or seek an in-person evaluation if the symptoms worsen or if the condition fails to improve as anticipated.  I provided 50 minutes of non-face-to-face time during this encounter.  THERAPIST PROGRESS NOTE  Session Time: 8:00 AM to 8:50 AM  Participation Level: Active  Behavioral Response: CasualAlertappropriate but pain issues impact affect  Type of Therapy: Individual Therapy  Treatment Goals addressed:  help cope with health issues, anxiety, triggers, coping, utilize therapy as a way for patient to focus on working through current stressors that also helps to distract from pain. Interventions: Solution Focused, Strength-based, Supportive, and Other: coping  Summary: Lindsay [name] is a 50 y.o. female who presents with walk on left side excruciating pain, shortness of breath. Sat went to the ER there over 9 hours and nobody saw her. Platelets high, anemic, ex-ray show enlarged heart. Decided to make an appointment at internal medicine to tell her what is going on. In excruciating pain. Right above her hip. Pain started last Thursday thought would go away  this is not going anyway. Still pain with lower back, legs and new pain. Went to pain clinic and he decides to give her a psych med Nortriptyline was thinking was stupid suggested before amitriptyline and patient shared that it started by psychiatrist and didn't work. Frustrated and left. Boyfriend encourages her to go to appointments. Needs to go to internal to see how going to address issues, there could be a blood clot in legs, does she have to go hospital, doesn't want to have heart attack or stroke. Got to see boyfriend this Saturday stayed a fancy hotel. It was about rest and peace of mind. He encourages her with self-care. Sometimes he distracts her from also shares with therapist needs to find out what is causing pain. Talks with boyfriend morning to night and has been doing for 3 months. Not busy in bed on phone. Watches TV shows on phone, they watch together. He wants to get married next year and she said ok.  Therapist pointed out a lot to look forward to although right now noted since starting therapy things better in terms of being in a relationship now.  Also needing to prioritize health issues so she can get better and enjoy these things with him.   Therapist reviewed symptoms, facilitated expression of thoughts and feelings validated patient on experience at ER, the difficulty of waiting over 9 hours in an emergency room not getting the care she needs.  Therapist feedback was that was positive she was going to internal medicine to find out what is going on therapist continues to encourage her to go to appointments so she can get her issues addressed and feel better.  Noted boyfriend very positive in her life he is a good influence, he encourages self-care, she also has someone who she can enjoy time with although limited with pain issues but helpful with patient's stressors to have positive experience and get this kind of support.  Noted patient's statement that therapist thought was very positive  that she wants to get to the bottom of pain issues feeling that was a statement of motivation and change. Therapist shared insight from her experiences that life is precious and not to waste so encouraging patient in that direction but also working on her health issues so she will be able to do that better.  Therapist provided active listening open questions, supportive interventions Suicidal/Homicidal: No  Plan: Return again in 2 weeks.2.Continue to provide strength-based and supportive interventions as patient is struggling with a lot of medical issues continuing her to encourage her to speak up for what she needs, coping  Diagnosis: Axis I: Major depressive disorder, recurrent, moderate, generalized anxiety disorder, chronic PTSD, panic attacks    Axis II: No diagnosis    [name], LCSW 10/06/2020

## 2020-10-06 NOTE — Telephone Encounter (Signed)
Writer spoke with pt who called requesting increased dose of the Remeron. Pt is currently on 15 mg qhs and states that you had discussed increasing if needed. Pt stated that she is still only sleeping a few hours per night. Pt next appointment scheduled for 12/12/20. Please review.

## 2020-10-07 ENCOUNTER — Emergency Department (HOSPITAL_COMMUNITY): Payer: Medicare Other

## 2020-10-07 ENCOUNTER — Emergency Department (HOSPITAL_COMMUNITY)
Admission: EM | Admit: 2020-10-07 | Discharge: 2020-10-07 | Disposition: A | Discharge status: Home / Self Care | Payer: Medicare Other | Attending: Emergency Medicine | Admitting: Emergency Medicine

## 2020-10-07 ENCOUNTER — Other Ambulatory Visit: Payer: Self-pay

## 2020-10-07 ENCOUNTER — Encounter (HOSPITAL_COMMUNITY): Payer: Self-pay | Admitting: *Deleted

## 2020-10-07 DIAGNOSIS — I130 Hypertensive heart and chronic kidney disease with heart failure and stage 1 through stage 4 chronic kidney disease, or unspecified chronic kidney disease: Secondary | ICD-10-CM | POA: Insufficient documentation

## 2020-10-07 DIAGNOSIS — N189 Chronic kidney disease, unspecified: Secondary | ICD-10-CM | POA: Insufficient documentation

## 2020-10-07 DIAGNOSIS — R110 Nausea: Secondary | ICD-10-CM | POA: Insufficient documentation

## 2020-10-07 DIAGNOSIS — Z87891 Personal history of nicotine dependence: Secondary | ICD-10-CM | POA: Insufficient documentation

## 2020-10-07 DIAGNOSIS — R0602 Shortness of breath: Secondary | ICD-10-CM | POA: Diagnosis not present

## 2020-10-07 DIAGNOSIS — Z79899 Other long term (current) drug therapy: Secondary | ICD-10-CM | POA: Insufficient documentation

## 2020-10-07 DIAGNOSIS — R079 Chest pain, unspecified: Secondary | ICD-10-CM | POA: Diagnosis not present

## 2020-10-07 DIAGNOSIS — Z7984 Long term (current) use of oral hypoglycemic drugs: Secondary | ICD-10-CM | POA: Diagnosis not present

## 2020-10-07 DIAGNOSIS — I509 Heart failure, unspecified: Secondary | ICD-10-CM | POA: Insufficient documentation

## 2020-10-07 DIAGNOSIS — M545 Low back pain, unspecified: Secondary | ICD-10-CM | POA: Insufficient documentation

## 2020-10-07 DIAGNOSIS — E119 Type 2 diabetes mellitus without complications: Secondary | ICD-10-CM | POA: Insufficient documentation

## 2020-10-07 DIAGNOSIS — R519 Headache, unspecified: Secondary | ICD-10-CM | POA: Diagnosis not present

## 2020-10-07 DIAGNOSIS — R059 Cough, unspecified: Secondary | ICD-10-CM | POA: Diagnosis not present

## 2020-10-07 DIAGNOSIS — Z20822 Contact with and (suspected) exposure to covid-19: Secondary | ICD-10-CM | POA: Insufficient documentation

## 2020-10-07 DIAGNOSIS — R0981 Nasal congestion: Secondary | ICD-10-CM | POA: Diagnosis not present

## 2020-10-07 DIAGNOSIS — I517 Cardiomegaly: Secondary | ICD-10-CM | POA: Diagnosis not present

## 2020-10-07 DIAGNOSIS — J3489 Other specified disorders of nose and nasal sinuses: Secondary | ICD-10-CM | POA: Diagnosis not present

## 2020-10-07 LAB — RESP PANEL BY RT-PCR (FLU A&B, COVID) ARPGX2
Influenza A by PCR: NEGATIVE
Influenza B by PCR: NEGATIVE
SARS Coronavirus 2 by RT PCR: NEGATIVE

## 2020-10-07 LAB — CBC WITH DIFFERENTIAL/PLATELET
Abs Immature Granulocytes: 0.05 10*3/uL (ref 0.00–0.07)
Basophils Absolute: 0 10*3/uL (ref 0.0–0.1)
Basophils Relative: 0 %
Eosinophils Absolute: 0.2 10*3/uL (ref 0.0–0.5)
Eosinophils Relative: 2 %
HCT: 37.7 % (ref 36.0–46.0)
Hemoglobin: 11.5 g/dL — ABNORMAL LOW (ref 12.0–15.0)
Immature Granulocytes: 1 %
Lymphocytes Relative: 37 %
Lymphs Abs: 3.4 10*3/uL (ref 0.7–4.0)
MCH: 26.3 pg (ref 26.0–34.0)
MCHC: 30.5 g/dL (ref 30.0–36.0)
MCV: 86.1 fL (ref 80.0–100.0)
Monocytes Absolute: 0.9 10*3/uL (ref 0.1–1.0)
Monocytes Relative: 10 %
Neutro Abs: 4.7 10*3/uL (ref 1.7–7.7)
Neutrophils Relative %: 50 %
Platelets: 444 10*3/uL — ABNORMAL HIGH (ref 150–400)
RBC: 4.38 MIL/uL (ref 3.87–5.11)
RDW: 18.2 % — ABNORMAL HIGH (ref 11.5–15.5)
WBC: 9.2 10*3/uL (ref 4.0–10.5)
nRBC: 0 % (ref 0.0–0.2)

## 2020-10-07 LAB — TROPONIN I (HIGH SENSITIVITY)
Troponin I (High Sensitivity): 4 ng/L (ref <18)
Troponin I (High Sensitivity): 3 ng/L (ref <18)

## 2020-10-07 LAB — COMPREHENSIVE METABOLIC PANEL
ALT: 10 U/L (ref 0–44)
AST: 8 U/L — ABNORMAL LOW (ref 15–41)
Albumin: 3.4 g/dL — ABNORMAL LOW (ref 3.5–5.0)
Alkaline Phosphatase: 68 U/L (ref 38–126)
Anion gap: 10 (ref 5–15)
BUN: 6 mg/dL (ref 6–20)
CO2: 21 mmol/L — ABNORMAL LOW (ref 22–32)
Calcium: 8.9 mg/dL (ref 8.9–10.3)
Chloride: 108 mmol/L (ref 98–111)
Creatinine, Ser: 0.87 mg/dL (ref 0.44–1.00)
GFR, Estimated: >60 mL/min (ref >60)
Glucose, Bld: 94 mg/dL (ref 70–99)
Potassium: 4 mmol/L (ref 3.5–5.1)
Sodium: 139 mmol/L (ref 135–145)
Total Bilirubin: 0.3 mg/dL (ref 0.3–1.2)
Total Protein: 6.7 g/dL (ref 6.5–8.1)

## 2020-10-07 LAB — BRAIN NATRIURETIC PEPTIDE: B Natriuretic Peptide: 33.6 pg/mL (ref 0.0–100.0)

## 2020-10-07 IMAGING — CR DG CHEST 2V
2 series · 2 of 2 positions shown · non-contrast
Comparison: Chest radiograph dated [DATE].

CLINICAL DATA: Shortness of breath.

EXAM:
CHEST - 2 VIEW

[chest lat]
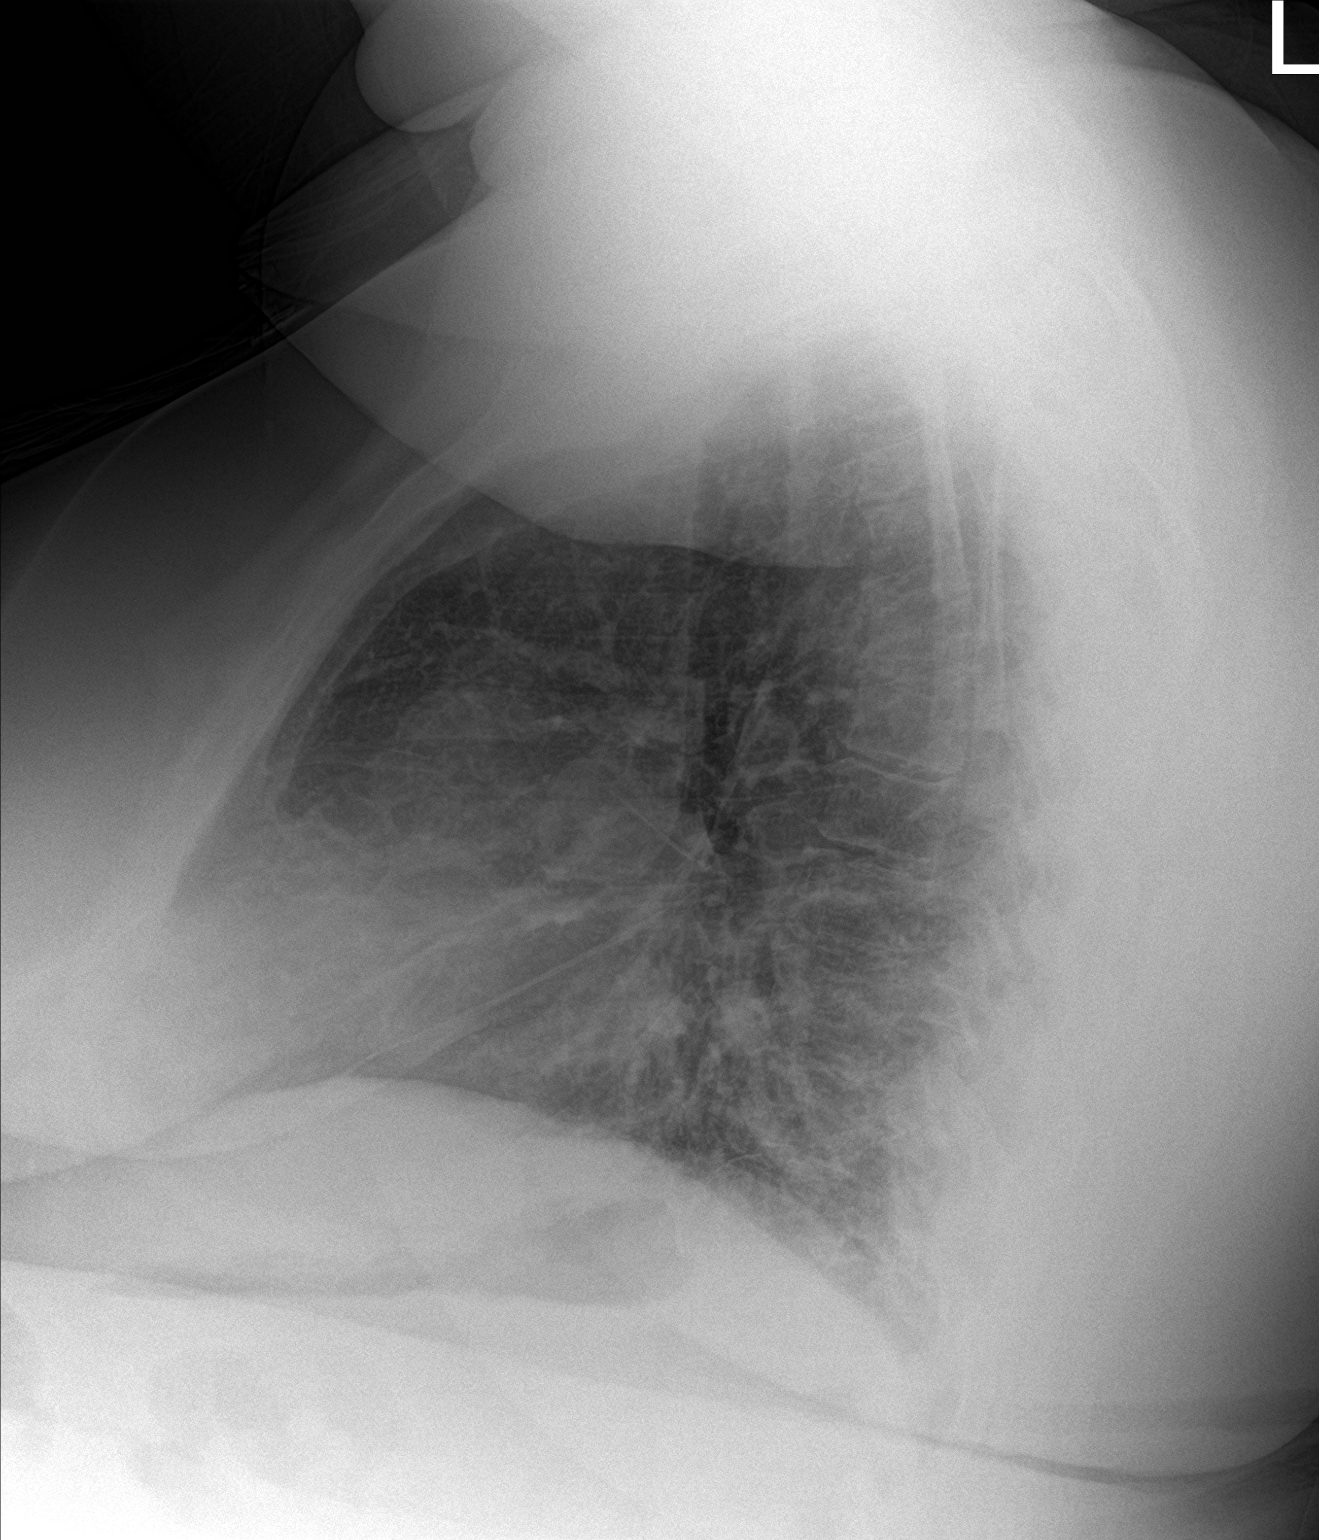

[chest ap]
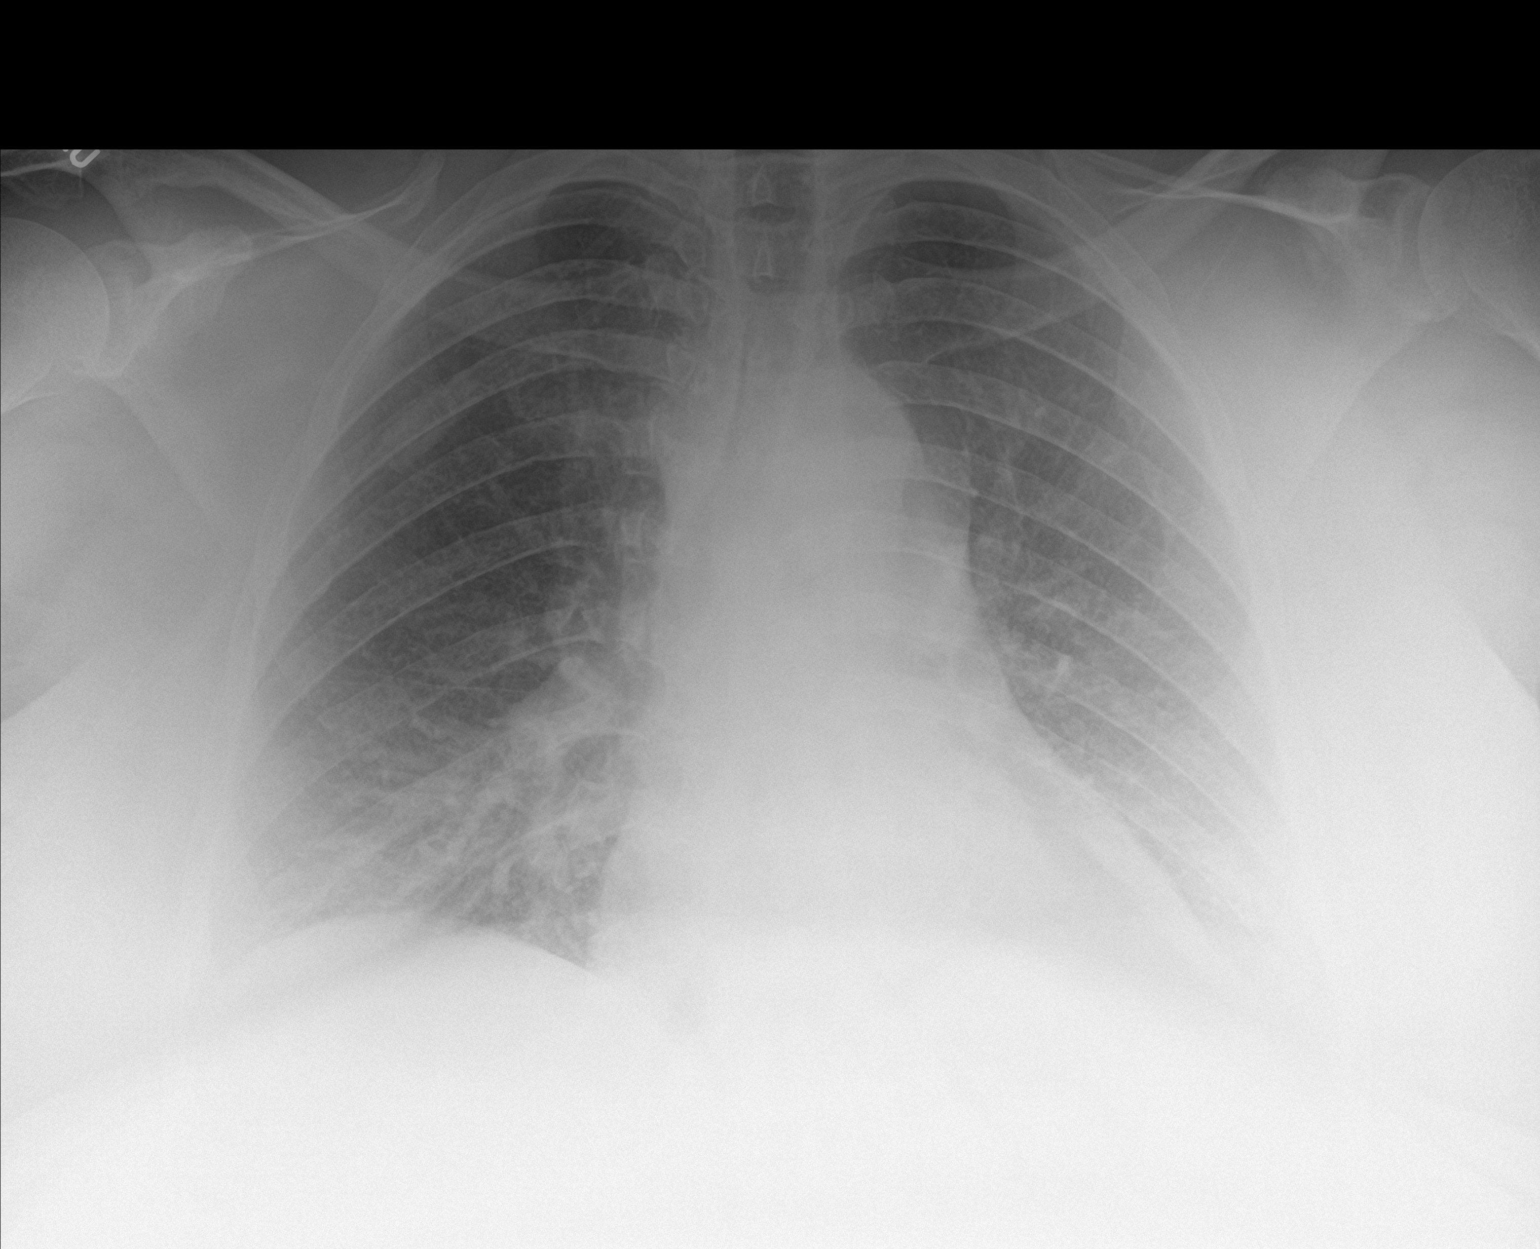

[2 of 2 positions shown; findings below may reference images not displayed]

FINDINGS: Diffuse hazy density throughout the lungs with interstitial
prominence may represent edema. Atypical pneumonia is not excluded
clinical correlation is recommended. No focal consolidation, pleural
effusion or pneumothorax. Borderline cardiomegaly. No acute osseous
pathology.
IMPRESSION: Diffuse hazy density throughout the lungs may represent edema versus
atypical pneumonia.

## 2020-10-07 MED ORDER — KETOROLAC TROMETHAMINE 60 MG/2ML IM SOLN
60.0000 mg | Freq: Once | INTRAMUSCULAR | Status: AC
  Administered 2020-10-07: 60 mg via INTRAMUSCULAR
  Filled 2020-10-07: qty 2

## 2020-10-07 MED ORDER — AMOXICILLIN-POT CLAVULANATE 875-125 MG PO TABS: 1.0000 | ORAL_TABLET | Freq: Two times a day (BID) | ORAL | 0 refills | Status: DC

## 2020-10-07 MED ORDER — ASPIRIN 81 MG PO CHEW: 324.0000 mg | CHEWABLE_TABLET | Freq: Once | ORAL | Status: DC

## 2020-10-07 MED ORDER — IPRATROPIUM-ALBUTEROL 0.5-2.5 (3) MG/3ML IN SOLN
3.0000 mL | Freq: Once | RESPIRATORY_TRACT | Status: AC
  Administered 2020-10-07: 3 mL via RESPIRATORY_TRACT
  Filled 2020-10-07: qty 3

## 2020-10-07 MED ORDER — AZITHROMYCIN 250 MG PO TABS: 250.0000 mg | ORAL_TABLET | Freq: Every day | ORAL | 0 refills | Status: DC

## 2020-10-07 MED ORDER — LIDOCAINE 5 % EX PTCH: 2.0000 | MEDICATED_PATCH | CUTANEOUS | 0 refills | Status: DC

## 2020-10-07 MED ORDER — FUROSEMIDE 10 MG/ML IJ SOLN
40.0000 mg | Freq: Once | INTRAMUSCULAR | Status: AC
  Administered 2020-10-07: 40 mg via INTRAVENOUS
  Filled 2020-10-07: qty 4

## 2020-10-07 NOTE — ED Provider Notes (Signed)
[name]MENT Provider Note   CSN: [id number] Arrival date & time: 10/07/20  0010     History Chief Complaint  Patient presents with   Shortness of Breath   Chest Pain    Lindsay [name] is a 50 y.o. female pmh COPD, CHF, HTN, peripheral neuropathyy, CKD and migraines presenting today with a complaint of shortness of breath since Tuesday.  Patient reports that she has been having difficulty catching her breath that is not resolving with the use of her inhalers or DuoNeb.  Reports that on Friday she also had a 100.1 degree temperature.  Endorses some coexisting chest pain mostly to the left side of her chest.  No history of DVT/PE.  Reports that she had a cough 1.5 weeks ago however this resolved.  Endorses associated nasal congestion and rhinorrhea.  Denies any known sick contacts.  Reports that she has not noted lower leg edema however does admit that she has not been taking her torsemide since Friday. Some nausea, no VDC.   Shortness of Breath Associated symptoms: chest pain and headaches   Associated symptoms: no vomiting   Chest Pain Associated symptoms: back pain, headache, nausea and shortness of breath   Associated symptoms: no dizziness, no palpitations and no vomiting       Past Medical History:  Diagnosis Date   Acid reflux    Anemia    Iron Def   Anorexia    CHF (congestive heart failure) (HCC)    Chronic kidney disease    Nephrotic syndrome   Colon polyp 2009   Depression with anxiety    Edema leg    Fibromyalgia    Hemorrhoids    Hidradenitis suppurativa    Hypertension    IBS (irritable bowel syndrome)    Low back pain    Migraines    Morbidly obese (HCC)    Neuromuscular disorder (HCC)    fibromyalgia   Neuropathy    Panic attacks    Polyp of vocal cord or larynx    Tonsil pain     Patient Active Problem List   Diagnosis Date Noted   Diabetes ([place]) 02/03/2020   HLD (hyperlipidemia) 02/03/2020   Dizziness  02/02/2020   Pain in both knees 12/09/2019   Shortness of breath    Chronic heart failure ([place]) 11/25/2019   OSA (obstructive sleep apnea) 06/14/2019   Right shoulder pain 04/21/2019   Gum lesion 03/15/2019   Compression fracture of body of thoracic vertebra ([place]) 03/08/2019   Viral URI 02/24/2019   Iron deficiency 02/10/2019   History of colon polyps    Chronic back pain 12/05/2017   Right arm pain 12/05/2017   Chronic respiratory failure with hypoxia ([place])    Vitamin B12 deficiency 05/02/2017   Palpitations 04/11/2017   Nutritional anemia 06/17/2016   Vitamin D deficiency, unspecified 06/12/2016   Nicotine dependence 06/12/2016   Menorrhagia 01/08/2016   Migraines 01/08/2016   Leg pain, bilateral 11/29/2015   Fibromyalgia 07/05/2015   Chronic leg pain 09/26/2014   Healthcare maintenance 09/26/2014   Morbid obesity ([place]) 07/04/2014   Major depressive disorder, recurrent episode, moderate ([place]) 10/13/2012   Generalized anxiety disorder 10/13/2012   Chronic nausea 05/10/2010   IBS (irritable bowel syndrome) 06/16/2007   Essential hypertension 02/06/2006   Hidradenitis 11/18/2005    Past Surgical History:  Procedure Laterality Date   AXILLARY HIDRADENITIS EXCISION     COLONOSCOPY WITH PROPOFOL N/A 05/25/2015   Procedure: COLONOSCOPY WITH PROPOFOL;  Surgeon: Quillian Quince  Merrily Brittle, MD;  Location: [facility];  Service: Endoscopy;  Laterality: N/A;   COLONOSCOPY WITH PROPOFOL N/A 12/31/2018   Procedure: COLONOSCOPY WITH PROPOFOL;  Surgeon: Milus Banister, MD;  Location: [facility];  Service: Endoscopy;  Laterality: N/A;   HEMORRHOID SURGERY     with Hidradenitis surgery    INGUINAL HIDRADENITIS EXCISION     TONSILLECTOMY  10/18/2010   by Dr. Wilburn Cornelia   UPPER GASTROINTESTINAL ENDOSCOPY       OB History     Gravida  2   Para  1   Term      Preterm      AB  1   Living         SAB      IAB  1   Ectopic      Multiple      Live Births               Family History  Problem Relation Age of Onset   Diabetes Mother    Heart disease Mother        valve leak   Anxiety disorder Mother    Depression Mother    High blood pressure Mother    Kidney failure Mother    Cancer Father        prostate   Heart disease Father    Learning disabilities Sister    Depression Sister    Depression Sister    Anxiety disorder Sister    Colon cancer Neg Hx     Social History   Tobacco Use   Smoking status: Former    Packs/day: 0.10    Years: 25.00    Pack years: 2.50    Types: Cigarettes    Quit date: 05/24/2019    Years since quitting: 1.3   Smokeless tobacco: Never   Tobacco comments:    quit in April.  Vaping Use   Vaping Use: Never used  Substance Use Topics   Alcohol use: Not Currently    Alcohol/week: 0.0 standard drinks    Comment: none sine 05/2018   Drug use: Not Currently    Frequency: 3.0 times per week    Types: Marijuana    Home Medications Prior to Admission medications   Medication Sig Start Date End Date Taking? Authorizing Provider  omeprazole (PRILOSEC) 40 MG capsule TAKE 1 CAPSULE BY MOUTH EVERY DAY 04/20/20   Velna Ochs, MD  ACCU-CHEK GUIDE test strip USE ONE TEST STRIP TO CHECK BLOOD SUGARS ONCE A DAY AS NEEDED 08/10/20   Velna Ochs, MD  Accu-Chek Softclix Lancets lancets USE ONE LANCET TO CHECK BLOOD SUGAR ONE A DAY AS NEEDED 08/08/20   Velna Ochs, MD  acetaminophen (TYLENOL) 650 MG CR tablet Take 1,300 mg by mouth every 8 (eight) hours as needed for pain.    [provider]  albuterol (PROVENTIL) (2.5 MG/3ML) 0.083% nebulizer solution Take 3 mLs (2.5 mg total) by nebulization every 4 (four) hours as needed for wheezing or shortness of breath. 09/25/20   Olalere, Adewale A, MD  albuterol (VENTOLIN HFA) 108 (90 Base) MCG/ACT inhaler TAKE 2 PUFFS BY MOUTH EVERY 6 HOURS AS NEEDED FOR WHEEZE OR SHORTNESS OF BREATH 09/05/20   Olalere, Adewale A, MD  Ascorbic Acid (VITAMIN C PO) Take 1 tablet  by mouth daily.    [provider]  atorvastatin (LIPITOR) 40 MG tablet Take 1 tablet (40 mg total) by mouth daily. 10/06/20   Velna Ochs, MD  BIOTIN PO Take 1  tablet by mouth daily.    [provider]  Blood Glucose Monitoring Suppl (ACCU-CHEK GUIDE ME) w/Device KIT Dispense one device 02/16/20   Velna Ochs, MD  Brexpiprazole (REXULTI) 3 MG TABS Take 1 tablet (3 mg total) by mouth daily. 09/11/20   [name], Arlyce Harman, MD  Capsaicin 0.025 % PTCH Apply 1 patch topically daily as needed. 02/16/20   Velna Ochs, MD  cetirizine (ZYRTEC) 10 MG tablet TAKE 1 TABLET BY MOUTH EVERY DAY 08/03/20   Sherrilyn Rist A, MD  cholecalciferol (VITAMIN D) 25 MCG (1000 UNIT) tablet TAKE 1 TABLET BY MOUTH EVERY DAY 09/06/20   Velna Ochs, MD  clindamycin (CLINDAGEL) 1 % gel APPLY TO AFFECTED AREA TWICE A DAY 04/03/20   Velna Ochs, MD  clorazepate (TRANXENE) 3.75 MG tablet Take 1 tablet (3.75 mg total) by mouth daily. 09/11/20   [name], Arlyce Harman, MD  diclofenac Sodium (VOLTAREN) 1 % GEL APPLY 2 GRAMS TOPICALLY 4 (FOUR) TIMES DAILY AS NEEDED FOR KNEE PAIN 06/05/20   Velna Ochs, MD  ENTRESTO 24-26 MG Take 1 tablet by mouth 2 (two) times daily. 01/03/20   [provider]  famotidine (PEPCID) 20 MG tablet TAKE 1 TABLET BY MOUTH EVERY DAY 09/25/20   Velna Ochs, MD  gabapentin (NEURONTIN) 300 MG capsule Take 3 capsules (900 mg total) by mouth at bedtime. 09/26/20   Velna Ochs, MD  hydrOXYzine (VISTARIL) 50 MG capsule Take 1 capsule (50 mg total) by mouth at bedtime as needed for itching. Patient not taking: Reported on 09/11/2020 06/08/20   Kathlee Nations, MD  lamoTRIgine (LAMICTAL) 150 MG tablet Take 1 tablet (150 mg total) by mouth daily. 09/11/20   [name], Arlyce Harman, MD  lidocaine (XYLOCAINE) 5 % ointment Apply 1 application topically as needed for mild pain. 09/25/20   Velna Ochs, MD  meclizine (ANTIVERT) 25 MG tablet TAKE 1 TABLET BY MOUTH 3 TIMES DAILY AS  NEEDED FOR DIZZINESS. 08/30/20   Velna Ochs, MD  megestrol (MEGACE) 40 MG tablet TAKE 2 TABLETS BY MOUTH EVERY DAY 04/24/20   Anyanwu, Sallyanne Havers, MD  metFORMIN (GLUCOPHAGE-XR) 500 MG 24 hr tablet TAKE 2 TABLETS BY MOUTH EVERY DAY WITH BREAKFAST 09/26/20   Velna Ochs, MD  metoprolol succinate (TOPROL-XL) 50 MG 24 hr tablet Take 50 mg by mouth daily. 01/03/20   [provider]  mirtazapine (REMERON) 30 MG tablet Take 1 tablet (30 mg total) by mouth at bedtime. 10/06/20 10/06/21  [name], Arlyce Harman, MD  ondansetron (ZOFRAN) 4 MG tablet Take 1 tablet (4 mg total) by mouth every 6 (six) hours as needed for nausea or vomiting. Patient not taking: Reported on 09/11/2020 07/01/20   Orpah Greek, MD  OZEMPIC, 0.25 OR 0.5 MG/DOSE, 2 MG/1.5ML SOPN INJECT 0.5 MG INTO THE SKIN ONCE A WEEK. 09/20/20   Velna Ochs, MD  promethazine (PHENERGAN) 12.5 MG tablet Take 1 tablet (12.5 mg total) by mouth every 6 (six) hours as needed. 09/21/20   Milus Banister, MD  SYMBICORT 160-4.5 MCG/ACT inhaler INHALE 2 PUFFS INTO THE LUNGS IN THE MORNING AND AT BEDTIME. 08/24/20   Sherrilyn Rist A, MD  torsemide (DEMADEX) 20 MG tablet TAKE 2 TABLETS BY MOUTH TWICE A DAY 09/26/20   Velna Ochs, MD  vitamin B-12 (CYANOCOBALAMIN) 1000 MCG tablet Take 1 tablet (1,000 mcg total) by mouth daily. 05/06/17   Velna Ochs, MD  Vitamin E 400 units TABS Take 400 Units by mouth daily.     [provider]  Allergies    Lisinopril  Review of Systems   Review of Systems  Constitutional:  Positive for chills.  HENT:  Positive for congestion and rhinorrhea.   Respiratory:  Positive for shortness of breath.   Cardiovascular:  Positive for chest pain. Negative for palpitations and leg swelling.  Gastrointestinal:  Positive for nausea. Negative for constipation, diarrhea and vomiting.  Genitourinary:  Positive for flank pain.  Musculoskeletal:  Positive for back pain.  Neurological:  Positive for  headaches. Negative for dizziness.  Psychiatric/Behavioral:  The patient is nervous/anxious.   All other systems reviewed and are negative.  Physical Exam Updated Vital Signs BP 132/82   Pulse (!) 104   Temp 99.2 F (37.3 C) (Oral)   Resp 13   SpO2 97%   Physical Exam Vitals and nursing note reviewed.  Constitutional:      General: She is not in acute distress.    Appearance: Normal appearance. She is obese.  HENT:     Head: Normocephalic and atraumatic.  Eyes:     General: No scleral icterus.    Conjunctiva/sclera: Conjunctivae normal.  Cardiovascular:     Rate and Rhythm: Regular rhythm. Tachycardia present.  Pulmonary:     Effort: Pulmonary effort is normal. No tachypnea or respiratory distress.     Breath sounds: No decreased breath sounds, wheezing or rales.  Musculoskeletal:     Right lower leg: Edema present.     Left lower leg: Edema present.     Comments: 1+ pitting edema bilaterally  Skin:    General: Skin is warm and dry.     Findings: No rash.  Neurological:     Mental Status: She is alert.  Psychiatric:        Mood and Affect: Mood normal.        Behavior: Behavior normal.    ED Results / Procedures / Treatments   Labs (all labs ordered are listed, but only abnormal results are displayed) Labs Reviewed  CBC WITH DIFFERENTIAL/PLATELET - Abnormal; Notable for the following components:      Result Value   Hemoglobin 11.5 (*)    RDW 18.2 (*)    Platelets 444 (*)    All other components within normal limits  COMPREHENSIVE METABOLIC PANEL - Abnormal; Notable for the following components:   CO2 21 (*)    Albumin 3.4 (*)    AST 8 (*)    All other components within normal limits  RESP PANEL BY RT-PCR (FLU A&B, COVID) ARPGX2  BRAIN NATRIURETIC PEPTIDE  TROPONIN I (HIGH SENSITIVITY)  TROPONIN I (HIGH SENSITIVITY)    EKG None  Radiology DG Chest 2 View  Result Date: 10/07/2020 CLINICAL DATA:  Shortness of breath. EXAM: CHEST - 2 VIEW COMPARISON:   Chest radiograph dated 10/04/2020. FINDINGS: Diffuse hazy density throughout the lungs with interstitial prominence may represent edema. Atypical pneumonia is not excluded clinical correlation is recommended. No focal consolidation, pleural effusion or pneumothorax. Borderline cardiomegaly. No acute osseous pathology. IMPRESSION: Diffuse hazy density throughout the lungs may represent edema versus atypical pneumonia. Electronically Signed   By: Anner Crete M.D.   On: 10/07/2020 02:13    Procedures Procedures   Medications Ordered in ED Medications  aspirin chewable tablet 324 mg (324 mg Oral Not Given 10/07/20 1201)  ketorolac (TORADOL) injection 60 mg (60 mg Intramuscular Given 10/07/20 1305)  ipratropium-albuterol (DUONEB) 0.5-2.5 (3) MG/3ML nebulizer solution 3 mL (3 mLs Nebulization Given 10/07/20 1213)  furosemide (LASIX) injection 40 mg (40 mg Intravenous  Given 10/07/20 1305)    ED Course  I have reviewed the triage vital signs and the nursing notes.  Pertinent labs & imaging results that were available during my care of the patient were reviewed by me and considered in my medical decision making (see chart for details).  Clinical Course as of 10/07/20 1435  Sat Oct 07, 2020  1434 Resp Panel by RT-PCR (Flu A&B, Covid) Nasopharyngeal Swab [MR]    Clinical Course User Index [MR] Maximina Pirozzi, Cecilio Asper, PA-C   MDM Rules/Calculators/A&P  50 year old woman with COPD, CHF, HTN, CKD and many other comorbities presented today due to SOB since Tuesday. Patient had been utilizing her COPD inhalers at home with no avail. Duoneb also did not work. Reports wearing 5L of O2 at home due to a diagnosis of chronic respiratory failure with hypoxia. Reported a weight of 375 at home however today she is 397lb. Reports she hasn't taken her lasix since Friday and does not take it daily as prescribed.  When I evaluated the patient she was not showing any acute distress.  Patient reporting increased shortness  of breath over the past week. The emergent differential diagnosis for shortness of breath includes, but is not limited to, Pulmonary edema, bronchoconstriction, Pneumonia, Pulmonary embolism, Pneumotherax/ Hemothorax, Dysrythmia, ACS.  Patient in low risk category for [place] PE criteria.  Chest x-ray for this patient revealed atypical pneumonia vs. pulmonary edema.  Patient has been afebrile and has a normal white count today in the department.  COVID test was negative.  While pneumonia is possible I am more suspicious for pulmonary edema due to patient's non-compliance with her torsemide. She reports that because her back has been hurting and she has not wanted to take her diuretic because it is difficult to get up and urinate. Weight gain also likely to be due to diuretic nonadherence. I will treat with antibiotics for atypical pneumonia due to patient's many comorbidities.  We discussed the importance of diuretic compliance and they possible cause of her symptoms.  Her BNP was within normal limits and troponin was negative x2.  EKG signed by MD Lawsing.  Patient without tachycardia when she arrived today.  Became mildly tachycardic after being given DuoNeb.  Reports that the treatment may have helped a little bit however she still feels uncomfortable.  Given IV Lasix today in the department agrees to take her diuretic at home.  She also is agreeable to picking up her antibiotics and lidocaine pain patches at the pharmacy.  Return precautions discussed and attached to discharge papers.  Patient requesting discharge at this time and I believe patient stable to go home.   Final Clinical Impression(s) / ED Diagnoses Final diagnoses:  Shortness of breath  Acute left-sided low back pain without sciatica    Rx / DC Orders Results and diagnoses were explained to the patient. Return precautions discussed in full. Patient had no additional questions and expressed complete understanding.     Rhae Hammock, PA-C 10/07/20 1546    Regan Lemming, MD 10/07/20 [phone]

## 2020-10-07 NOTE — Discharge Instructions (Addendum)
Please make sure to take your torsemide every day as prescribed.  You have been prescribed 2 antibiotics by me.  Please pick these up from your pharmacy and begin taking them today for your pneumonia.  You should also continue your home inhalers and nebulizers.  Please follow-up with your primary care provider about your symptoms.  I have also sent lidocaine patches to the pharmacy.  Please follow the instructions on the packaging.

## 2020-10-07 NOTE — ED Provider Notes (Addendum)
Emergency Medicine Provider Triage Evaluation Note  Lindsay [name] , a 50 y.o. female  was evaluated in triage.  Pt complains of chest pain and shortness of breath for the last week.  Patient reports a history of COPD, chronic respiratory failure with hypoxia on oxygen at home.  Patient reports she wears 5 L throughout the day at home.  Also has a history of CHF, primarily systolic.  Reports swelling in her legs over the last few days but has not weighed herself over the last few days.  Reports she resented to the emergency department several days ago but left before being evaluated.  Dry weight 375lbs - unknown weight today.  Review of Systems  Positive: Chest pain, shortness of breath, leg swelling Negative: Fever, chills  Physical Exam  BP (!) 134/96 (BP Location: Right Arm)   Pulse 91   Temp 99.2 F (37.3 C) (Oral)   Resp (!) 22   SpO2 100%  Gen:   Awake, no distress   Resp:  Increased effort, speaks in 2-3 word sentences MSK:   Moves extremities without difficulty, peripheral edema Other:  Difficult to assess breath sounds due to body habitus  Medical Decision Making  Medically screening exam initiated at 3:24 AM.  Appropriate orders placed.  [name] was informed that the remainder of the evaluation will be completed by another provider, this initial triage assessment does not replace that evaluation, and the importance of remaining in the ED until their evaluation is complete.  Chest pain and shortness of breath.  Suspect CHF exacerbation.  Labs and imaging pending.   Harriette Tovey, Gwenlyn Perking 10/07/20 0027    Danese Dorsainvil, Jarrett Soho, PA-C 10/07/20 0324    Merrily Pew, MD 10/07/20 [phone]

## 2020-10-07 NOTE — ED Triage Notes (Signed)
Pt from home for sob and chest pain. Also reporting lower back pain. Pt is on 5L oxygen at baseline

## 2020-10-09 ENCOUNTER — Telehealth: Payer: Self-pay

## 2020-10-09 DIAGNOSIS — Z09 Encounter for follow-up examination after completed treatment for conditions other than malignant neoplasm: Secondary | ICD-10-CM | POA: Insufficient documentation

## 2020-10-09 NOTE — Telephone Encounter (Signed)
Return call to pt - stated she went to the ED on Saturday; dx w/Pneumonia and 2 abx's were ordered. Stated the pharmacy told her Amoxicillin is on backorder "until the pharmaceutical co make more. Also she has not started taking Azithromycin, stated she will get it today when her Aide arrives. SHe wants to know what should she do about not having the other abx? Also stated she has been taking 2 tabs of Atorvastatin; instructed she can take 1 tab per Dr Rivka Safer note (on 9/9). But she stated she's out med and the pharmacy told her it's not due for a refill and she will have to pay out of pocket which will be $300 and she does not have the money. Thanks

## 2020-10-09 NOTE — Assessment & Plan Note (Signed)
She presents for ED follow up. She presented 9/7 and 9/10 to the ED for shortness of breath. It appears that she left prior to undergoing an evaluation on 9/7.  Chest cxr did not appear significantly changed from prior films, BNP normal, no leukocytosis, or fever. It was suspected that this was a CHF exacerbation in the setting of non-compliance with her diuretic although atypical pneumonia could not be ruled out. She was discharged with instructions to resume diuresis and start augmentin.

## 2020-10-09 NOTE — Telephone Encounter (Signed)
Requesting to speak with a nurse. Please call back.  

## 2020-10-09 NOTE — Progress Notes (Deleted)
   Office Visit   Patient ID: Lindsay [name], female    DOB: 01-Jul-1970, 50 y.o.   MRN: [id number]   PCP: Velna Ochs, MD   Subjective:  Lindsay [name] is a 50 y.o. year old female who presents for hospital follow up. Please refer to problem based charting for assessment and plan.    Objective:   There were no vitals taken for this visit. BP Readings from Last 3 Encounters:  10/07/20 128/85  10/04/20 123/88  07/01/20 (!) 162/108    Assessment & Plan:   Problem List Items Addressed This Visit   None    No follow-ups on file.   Pt discussed with ***  Mitzi Hansen, MD Internal Medicine Resident PGY-3 [facility] 10/09/2020 12:58 PM

## 2020-10-10 ENCOUNTER — Telehealth: Payer: Self-pay | Admitting: Pulmonary Disease

## 2020-10-10 ENCOUNTER — Telehealth (INDEPENDENT_AMBULATORY_CARE_PROVIDER_SITE_OTHER): Payer: Medicare Other | Admitting: Pulmonary Disease

## 2020-10-10 ENCOUNTER — Encounter: Payer: Self-pay | Admitting: Pulmonary Disease

## 2020-10-10 ENCOUNTER — Ambulatory Visit (INDEPENDENT_AMBULATORY_CARE_PROVIDER_SITE_OTHER): Payer: Medicare Other | Admitting: Internal Medicine

## 2020-10-10 VITALS — Temp 100.0°F

## 2020-10-10 DIAGNOSIS — Z09 Encounter for follow-up examination after completed treatment for conditions other than malignant neoplasm: Secondary | ICD-10-CM

## 2020-10-10 DIAGNOSIS — J9611 Chronic respiratory failure with hypoxia: Secondary | ICD-10-CM

## 2020-10-10 DIAGNOSIS — R0602 Shortness of breath: Secondary | ICD-10-CM

## 2020-10-10 DIAGNOSIS — J189 Pneumonia, unspecified organism: Secondary | ICD-10-CM | POA: Diagnosis not present

## 2020-10-10 NOTE — Telephone Encounter (Signed)
Please see phone note from 9/12. Patient has tele appt this AM to discuss PNA?antibiotic issue.

## 2020-10-10 NOTE — Telephone Encounter (Signed)
Please have her schedule a tele health appointment for the PNA / antibiotic issue. I am not sure what to do about her atorvastatin. I think I already sent a refill in but if her pharmacy won't fill it she may have to wait.

## 2020-10-10 NOTE — Telephone Encounter (Signed)
Patient stated she was recently seen in ED and told she had pneumonia.  Patient stated she was prescribed Augmentin and Azithromycin.  Patient stated she was told Augmentin was on back order at [facility] and they were not sure when med would be available. Patient stated she was getting Azithromycin today to start.  Patient has questions about taking both antibiotics or if both antibiotics are needed.  Patient wanted to be seen by pulmonary to discuss pneumonia.  Patient was last seen in office 06/07/19, with [facility] NP.  Advised Patient Ov is advised. Patient stated she can not be seen in office, because of transportation.  Scheduled OV with Dr. Ander Slade via my chart visit at 3pm today.  Patient stated she understood how to do a my chart visit. Nothing further at this time.

## 2020-10-10 NOTE — Telephone Encounter (Signed)
She has a telehealth appt this am.

## 2020-10-10 NOTE — Progress Notes (Signed)
Lindsay [name] reports that was tested for flu and covid and was told that she was negative.   She is on Azithromycin x 2 days.    Virtual Visit via Video Note  I connected with [name] on 10/10/20 at  3:00 PM EDT by a video enabled telemedicine application and verified that I am speaking with the correct person using two identifiers.  Location: Lindsay [name]: Lindsay [name] was at home Provider: In the office, [place].   I discussed the limitations of evaluation and management by telemedicine and the availability of in person appointments. The Lindsay [name] expressed understanding and agreed to proceed.  History of Present Illness: Recent visit to the urgent care with cough, shortness of breath, sputum production She still bringing up greenish phlegm On a course of azithromycin  Still feeling under the weather  Mild fever    Observations/Objective: Laid up in bed Does not appear in extremities  Chest x-ray 10/07/2020-reviewed by myself showing interstitial prominence, no clear-cut infiltrate  Assessment and Plan: Atypical pneumonia -Influenza, COVID-negative  Exacerbation of chronic respiratory failure  History of obstructive sleep apnea  History of COPD, congestive heart failure, peripheral neuropathy, chronic kidney disease  Follow Up Instructions: Encourage Lindsay [name] to continue with azithromycin  If she is not feeling better in the next couple of days to give [name] a call and we may try different antibiotic  Sputum for gram stain and cultures may be ordered if not responding to antibiotics  Tentative follow-up in about 4 weeks   I discussed the assessment and treatment plan with the Lindsay [name]. The Lindsay [name] was provided an opportunity to ask questions and all were answered. The Lindsay [name] agreed with the plan and demonstrated an understanding of the instructions.   The Lindsay [name] was advised to call back or seek an in-person evaluation if the symptoms worsen or if the condition fails  to improve as anticipated.  I provided 20 minutes of non-face-to-face time during this encounter.   Laurin Coder, MD

## 2020-10-10 NOTE — Patient Instructions (Signed)
Atypical pneumonia, on azithromycin  Encouraged to continue azithromycin Give [name] a call in a couple of days to let us know how she is doing  May require a different antibiotic  Sputum for gram stain and cultures may be considered if not improving

## 2020-10-11 ENCOUNTER — Other Ambulatory Visit: Payer: Self-pay | Admitting: Physician Assistant

## 2020-10-11 DIAGNOSIS — M25561 Pain in right knee: Secondary | ICD-10-CM

## 2020-10-11 DIAGNOSIS — M25562 Pain in left knee: Secondary | ICD-10-CM

## 2020-10-11 NOTE — Progress Notes (Signed)
Lindsay [name] is a 50 year old chronically ill female with chronic respiratory failure secondary to COPD and untreated OSA, CHF, and CKD.  HPI:  Pt requested a telehealth visit for persistent shortness of breath after ED visits, 9/7 and 9/10. She left prior to being seen on 9/7. She had reported a productive cough with green sputum production a week prior to the ED visit however had become sputum had become clear by the time of evaluation in the ED. ED note indicates that she had missed several lasix doses prior to presentation. Workup in the ED was consistent with CHF exacerbation however, in addition to being told to restart lasix, she was given a script for augmentin and azithromycin.  Today, she reports persistent symptoms including shortness of breath and cough. She has started the azithromycin but had issues with insurance covering the augmentin and thus has not started it. She reports orthopnea. She reports persistent cough productive of clear mucous. Denies fevers, chills.  Assessment: CHF exacerbation due to medication non-compliance. I personally reviewed her xray from the ED on 9/10. There is no significant change from prior imaging, including no infiltrates--possibly some mild increase in vascular congestion but not significantly. No fever, no leukocytosis, influenza/COVID pcr negative. I suspect that her BNP was falsely low in the ED in the setting of morbid obesity.  I discussed the above with her. Since she has already started azithromycin, we will have her continue that but, in the absence of evidence of infectious process, will discontinue the augmentin.  As she does most of her care through telemedicine, we do not have any recent weights. She reports she is up 5lb from last week.I explained that she needed to come be evaluated in the ED so we could further evaluate for a CHF exacerbation. She explained that she does not wish to come in and says she wouldn't want labs done, in reference to  normal diuresis monitoring, because "insurance won't continue to cover all of these labs". I reiterated the reasoning that she needs to be seen in the clinic however she seemed resistant to the idea that this may be cardiac related.   Addendum: It appears that she requested a telehealth visit with pulmonology later in the day, who agreed with the above CXR findings. She reported green sputum discharge to them so they encouraged continuing azithromycin and plans to try alternative antimicrobial therapy if not feeling better in a few days.   Plan: Recommend requesting an in person evaluation if she calls our clinic for this issue again. Alternatively, she can call pulmonology again, which she may prefer and may be better anyway to avoid multiple treatment plans and unnecessary overlapping office visits.   No charge for today's visit.  Mitzi Hansen, MD Internal Medicine Resident PGY-3 [facility] Pager: [phone] 10/11/2020 9:12 PM

## 2020-10-12 ENCOUNTER — Telehealth: Payer: Self-pay | Admitting: Pulmonary Disease

## 2020-10-12 MED ORDER — DOXYCYCLINE HYCLATE 100 MG PO TABS: 100.0000 mg | ORAL_TABLET | Freq: Two times a day (BID) | ORAL | 0 refills | Status: DC

## 2020-10-12 MED ORDER — PREDNISONE 20 MG PO TABS: 40.0000 mg | ORAL_TABLET | Freq: Every day | ORAL | 0 refills | Status: AC

## 2020-10-12 NOTE — Telephone Encounter (Signed)
Reviewed chart.  COVID-negative few days ago 9/10.  9/10 CXR Streaky infiltrates.  Treated with azithromycin.  Ongoing symptoms.  Report history of COPD.  Doxycycline twice daily x7 days as well as prednisone 40 mg daily x5 days sent to local pharmacy.

## 2020-10-12 NOTE — Telephone Encounter (Signed)
Called and spoke with Patient.  Dr. Kavin Leech recommendations given.  Understanding stated.  Nothing further at this time.

## 2020-10-12 NOTE — Telephone Encounter (Signed)
Primary Pulmonologist: Dr. Ander Slade Last office visit and with whom: 10/10/20-Dr. Ander Slade What do we see them for (pulmonary problems): chronic respiratory failure Last OV assessment/plan:  Instructions  Atypical pneumonia, on azithromycin  Encouraged to continue azithromycin Give [name] a call in a couple of days to let us know how she is doing  May require a different antibiotic  Sputum for gram stain and cultures may be considered if not improving      Was appointment offered to patient (explain)?  Patient had OV 10/10/20   Reason for call:   Called and spoke with Patient.  Patient stated she felt like she needed the additional antibiotic discussed at [place] with Dr. Ander Slade.  Patient stated she is having sob, fever 99-101, and cough.  Patient stated she feels worse and can not cough up mucus.  Patient stated what she does cough up is a yellow color.  Patient has not been covid tested since Saturday.  Patient stated 10/07/20 she was tested for flu and covid.  Patient stated she has been told she has pneumonia. Patient request antibiotic to be sent to [place].  Message routed to Dr. Silas Flood (DOD) Dr. Ander Slade is unavailable today.  Allergies  Allergen Reactions   Lisinopril Rash and Cough    Immunization History  Administered Date(s) Administered   Influenza Split 11/27/2010   Influenza Whole 11/23/2008   Influenza,inj,Quad PF,6+ Mos 10/20/2014, 10/09/2015, 11/04/2016, 11/09/2017, 11/18/2018, 11/26/2019   PFIZER(Purple Top)SARS-COV-2 Vaccination 04/17/2019, 05/08/2019, 03/28/2020   Pneumococcal Polysaccharide-23 08/12/2018, 11/26/2019

## 2020-10-13 ENCOUNTER — Other Ambulatory Visit: Payer: Self-pay | Admitting: Gastroenterology

## 2020-10-16 ENCOUNTER — Encounter: Payer: Self-pay | Admitting: Family Medicine

## 2020-10-16 ENCOUNTER — Telehealth (INDEPENDENT_AMBULATORY_CARE_PROVIDER_SITE_OTHER): Payer: Medicare Other | Admitting: Family Medicine

## 2020-10-16 ENCOUNTER — Other Ambulatory Visit: Payer: Self-pay | Admitting: Obstetrics & Gynecology

## 2020-10-16 ENCOUNTER — Other Ambulatory Visit: Payer: Self-pay | Admitting: Family Medicine

## 2020-10-16 DIAGNOSIS — Z1231 Encounter for screening mammogram for malignant neoplasm of breast: Secondary | ICD-10-CM

## 2020-10-16 DIAGNOSIS — N9489 Other specified conditions associated with female genital organs and menstrual cycle: Secondary | ICD-10-CM

## 2020-10-16 DIAGNOSIS — N921 Excessive and frequent menstruation with irregular cycle: Secondary | ICD-10-CM

## 2020-10-16 MED ORDER — ADDYI 100 MG PO TABS: 1.0000 | ORAL_TABLET | Freq: Every day | ORAL | 5 refills | Status: DC

## 2020-10-16 NOTE — Assessment & Plan Note (Signed)
Trial of Addyi. Discussed usual onset of effectiveness and to set a goal and participate and may enjoy it.

## 2020-10-16 NOTE — Progress Notes (Signed)
GYNECOLOGY VIRTUAL VISIT ENCOUNTER NOTE  Provider location: [facility]   Patient location: Home  I connected with Druscilla [name] on 10/16/20 at  1:35 PM EDT by MyChart Video Encounter and verified that I am speaking with the correct person using two identifiers.   I discussed the limitations, risks, security and privacy concerns of performing an evaluation and management service virtually and the availability of in person appointments. I also discussed with the patient that there may be a patient responsible charge related to this service. The patient expressed understanding and agreed to proceed.   History:  Lindsay [name] is a 50 y.o. G42P0010 female being evaluated today for sexual dysfunction. She reports lack of arousal and decreased libido. Has been this way for some time. Would like female Viagra. She is asking for something now, because she has a new boyfriend. She is using Megace 40 g daily and increases to 2 pills/day if needed. She denies any abnormal vaginal discharge, bleeding, pelvic pain or other concerns.       Past Medical History:  Diagnosis Date   Acid reflux    Anemia    Iron Def   Anorexia    CHF (congestive heart failure) (HCC)    Chronic kidney disease    Nephrotic syndrome   Colon polyp 2009   Depression with anxiety    Edema leg    Fibromyalgia    Hemorrhoids    Hidradenitis suppurativa    Hypertension    IBS (irritable bowel syndrome)    Low back pain    Migraines    Morbidly obese (HCC)    Neuromuscular disorder (HCC)    fibromyalgia   Neuropathy    Panic attacks    Polyp of vocal cord or larynx    Tonsil pain    Past Surgical History:  Procedure Laterality Date   AXILLARY HIDRADENITIS EXCISION     COLONOSCOPY WITH PROPOFOL N/A 05/25/2015   Procedure: COLONOSCOPY WITH PROPOFOL;  Surgeon: Milus Banister, MD;  Location: [facility];  Service: Endoscopy;  Laterality: N/A;   COLONOSCOPY WITH PROPOFOL  N/A 12/31/2018   Procedure: COLONOSCOPY WITH PROPOFOL;  Surgeon: Milus Banister, MD;  Location: [facility];  Service: Endoscopy;  Laterality: N/A;   HEMORRHOID SURGERY     with Hidradenitis surgery    INGUINAL HIDRADENITIS EXCISION     TONSILLECTOMY  10/18/2010   by Dr. Wilburn Cornelia   UPPER GASTROINTESTINAL ENDOSCOPY     The following portions of the patient's history were reviewed and updated as appropriate: allergies, current medications, past family history, past medical history, past social history, past surgical history and problem list.   Health Maintenance:  Normal pap and negative HRHPV on 11/18/2018.  Normal mammogram on 01/25/2019.   Review of Systems:  Pertinent items noted in HPI and remainder of comprehensive ROS otherwise negative.  Physical Exam:   General:  Alert, oriented and cooperative. Patient appears to be in no acute distress.  Mental Status: Normal mood and affect. Normal behavior. Normal judgment and thought content.   Respiratory: Normal respiratory effort, no problems with respiration noted  Rest of physical exam deferred due to type of encounter  Labs and Imaging Results for orders placed or performed during the hospital encounter of 10/07/20 (from the past 336 hour(s))  CBC with Differential/Platelet   Collection Time: 10/07/20 12:24 AM  Result Value Ref Range   WBC 9.2 4.0 - 10.5 K/uL   RBC 4.38 3.87 -  5.11 MIL/uL   Hemoglobin 11.5 (L) 12.0 - 15.0 g/dL   HCT 37.7 36.0 - 46.0 %   MCV 86.1 80.0 - 100.0 fL   MCH 26.3 26.0 - 34.0 pg   MCHC 30.5 30.0 - 36.0 g/dL   RDW 18.2 (H) 11.5 - 15.5 %   Platelets 444 (H) 150 - 400 K/uL   nRBC 0.0 0.0 - 0.2 %   Neutrophils Relative % 50 %   Neutro Abs 4.7 1.7 - 7.7 K/uL   Lymphocytes Relative 37 %   Lymphs Abs 3.4 0.7 - 4.0 K/uL   Monocytes Relative 10 %   Monocytes Absolute 0.9 0.1 - 1.0 K/uL   Eosinophils Relative 2 %   Eosinophils Absolute 0.2 0.0 - 0.5 K/uL   Basophils Relative 0 %   Basophils Absolute  0.0 0.0 - 0.1 K/uL   Immature Granulocytes 1 %   Abs Immature Granulocytes 0.05 0.00 - 0.07 K/uL  Comprehensive metabolic panel   Collection Time: 10/07/20 12:24 AM  Result Value Ref Range   Sodium 139 135 - 145 mmol/L   Potassium 4.0 3.5 - 5.1 mmol/L   Chloride 108 98 - 111 mmol/L   CO2 21 (L) 22 - 32 mmol/L   Glucose, Bld 94 70 - 99 mg/dL   BUN 6 6 - 20 mg/dL   Creatinine, Ser 0.87 0.44 - 1.00 mg/dL   Calcium 8.9 8.9 - 10.3 mg/dL   Total Protein 6.7 6.5 - 8.1 g/dL   Albumin 3.4 (L) 3.5 - 5.0 g/dL   AST 8 (L) 15 - 41 U/L   ALT 10 0 - 44 U/L   Alkaline Phosphatase 68 38 - 126 U/L   Total Bilirubin 0.3 0.3 - 1.2 mg/dL   GFR, Estimated >60 >60 mL/min   Anion gap 10 5 - 15  Brain natriuretic peptide   Collection Time: 10/07/20 12:24 AM  Result Value Ref Range   B Natriuretic Peptide 33.6 0.0 - 100.0 pg/mL  Troponin I (High Sensitivity)   Collection Time: 10/07/20 12:24 AM  Result Value Ref Range   Troponin I (High Sensitivity) 4 <18 ng/L  Troponin I (High Sensitivity)   Collection Time: 10/07/20  6:34 AM  Result Value Ref Range   Troponin I (High Sensitivity) 3 <18 ng/L  Resp Panel by RT-PCR (Flu A&B, Covid) Nasopharyngeal Swab   Collection Time: 10/07/20 10:11 AM   Specimen: Nasopharyngeal Swab; Nasopharyngeal(NP) swabs in vial transport medium  Result Value Ref Range   SARS Coronavirus 2 by RT PCR NEGATIVE NEGATIVE   Influenza A by PCR NEGATIVE NEGATIVE   Influenza B by PCR NEGATIVE NEGATIVE  Results for orders placed or performed during the hospital encounter of 10/04/20 (from the past 336 hour(s))  Basic metabolic panel   Collection Time: 10/04/20  1:09 PM  Result Value Ref Range   Sodium 138 135 - 145 mmol/L   Potassium 4.2 3.5 - 5.1 mmol/L   Chloride 105 98 - 111 mmol/L   CO2 27 22 - 32 mmol/L   Glucose, Bld 98 70 - 99 mg/dL   BUN 5 (L) 6 - 20 mg/dL   Creatinine, Ser 0.80 0.44 - 1.00 mg/dL   Calcium 9.0 8.9 - 10.3 mg/dL   GFR, Estimated >60 >60 mL/min   Anion  gap 6 5 - 15  CBC   Collection Time: 10/04/20  1:09 PM  Result Value Ref Range   WBC 9.2 4.0 - 10.5 K/uL   RBC 4.42 3.87 - 5.11 MIL/uL  Hemoglobin 11.5 (L) 12.0 - 15.0 g/dL   HCT 37.4 36.0 - 46.0 %   MCV 84.6 80.0 - 100.0 fL   MCH 26.0 26.0 - 34.0 pg   MCHC 30.7 30.0 - 36.0 g/dL   RDW 17.8 (H) 11.5 - 15.5 %   Platelets 483 (H) 150 - 400 K/uL   nRBC 0.0 0.0 - 0.2 %  Brain natriuretic peptide   Collection Time: 10/04/20  1:10 PM  Result Value Ref Range   B Natriuretic Peptide 26.7 0.0 - 100.0 pg/mL  I-Stat beta hCG blood, ED   Collection Time: 10/04/20  1:23 PM  Result Value Ref Range   I-stat hCG, quantitative <5.0 <5 mIU/mL   Comment 3           *Note: Due to a large number of results and/or encounters for the requested time period, some results have not been displayed. A complete set of results can be found in Results Review.   DG Chest 2 View  Result Date: 10/07/2020 CLINICAL DATA:  Shortness of breath. EXAM: CHEST - 2 VIEW COMPARISON:  Chest radiograph dated 10/04/2020. FINDINGS: Diffuse hazy density throughout the lungs with interstitial prominence may represent edema. Atypical pneumonia is not excluded clinical correlation is recommended. No focal consolidation, pleural effusion or pneumothorax. Borderline cardiomegaly. No acute osseous pathology. IMPRESSION: Diffuse hazy density throughout the lungs may represent edema versus atypical pneumonia. Electronically Signed   By: Anner Crete M.D.   On: 10/07/2020 02:13   DG Chest 2 View  Result Date: 10/04/2020 CLINICAL DATA:  Shortness of breath, left-sided chest pain EXAM: CHEST - 2 VIEW COMPARISON:  Chest radiograph 12/28/2019 FINDINGS: The heart is mildly enlarged. The mediastinal contours are within normal limits. There is prominence of the right pulmonary artery, similar to the prior study. There is no focal consolidation or pulmonary edema. There is no pleural effusion or pneumothorax. There is no acute osseous abnormality.  IMPRESSION: Borderline cardiomegaly with enlargement of the pulmonary vasculature raising the possibility of pulmonary hypertension, as seen on prior CTA chest. Otherwise, no radiographic evidence of acute cardiopulmonary process. Electronically Signed   By: Valetta Mole M.D.   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 Discussed usual onset of effectiveness and to set a goal and participate and may enjoy it.       Relevant Medications   Flibanserin (ADDYI) 100 MG TABS   Other Visit Diagnoses     Encounter for screening mammogram for malignant neoplasm of breast       Relevant Orders   MM DIAG BREAST TOMO BILATERAL            I discussed the assessment and treatment plan with the patient. The patient was provided an opportunity to ask questions and all were answered. The patient agreed with the plan and demonstrated an understanding of the instructions.   The patient was advised to call back or seek an in-person evaluation/go to the ED if the symptoms worsen or if the condition fails to improve as anticipated.  I provided 14 minutes of face-to-face time during this encounter.   Donnamae Jude, MD [facility] [place]

## 2020-10-17 ENCOUNTER — Other Ambulatory Visit: Payer: Self-pay | Admitting: Family Medicine

## 2020-10-17 DIAGNOSIS — N649 Disorder of breast, unspecified: Secondary | ICD-10-CM

## 2020-10-20 ENCOUNTER — Ambulatory Visit (INDEPENDENT_AMBULATORY_CARE_PROVIDER_SITE_OTHER): Payer: Medicare Other | Admitting: Licensed Clinical Social Worker

## 2020-10-20 DIAGNOSIS — F411 Generalized anxiety disorder: Secondary | ICD-10-CM

## 2020-10-20 DIAGNOSIS — F4312 Post-traumatic stress disorder, chronic: Secondary | ICD-10-CM

## 2020-10-20 DIAGNOSIS — F331 Major depressive disorder, recurrent, moderate: Secondary | ICD-10-CM

## 2020-10-20 DIAGNOSIS — F410 Panic disorder [episodic paroxysmal anxiety] without agoraphobia: Secondary | ICD-10-CM | POA: Diagnosis not present

## 2020-10-20 NOTE — Progress Notes (Signed)
Internal Medicine Clinic Attending  Case discussed with Dr. Darrick Meigs  At the time of the visit.  We reviewed the resident's history and pertinent patient test results.  I agree with the assessment, diagnosis, and plan of care documented in the resident's note.

## 2020-10-20 NOTE — Progress Notes (Signed)
  Virtual Visit via Telephone Note  I connected with [name] on 10/20/20 at  8:00 AM EDT by telephone and verified that I am speaking with the correct person using two identifiers.  Location: Patient: home Provider: home office   I discussed the limitations, risks, security and privacy concerns of performing an evaluation and management service by telephone and the availability of in person appointments. I also discussed with the patient that there may be a patient responsible charge related to this service. The patient expressed understanding and agreed to proceed.  I discussed the assessment and treatment plan with the patient. The patient was provided an opportunity to ask questions and all were answered. The patient agreed with the plan and demonstrated an understanding of the instructions.   The patient was advised to call back or seek an in-person evaluation if the symptoms worsen or if the condition fails to improve as anticipated.  I provided 10 minutes of non-face-to-face time during this encounter.  THERAPIST PROGRESS NOTE  Session Time: 8:00 AM to 8:10 AM  Participation Level: Active  Behavioral Response: CasualAlertDysphoric  Type of Therapy: Individual Therapy  Treatment Goals addressed:  help cope with health issues, anxiety, triggers, coping, utilize therapy as a way for patient to focus on working through current stressors that also helps to distract from pain. Interventions: Solution Focused, Strength-based, and Supportive  Summary: Lindsay [name] is a 50 y.o. female who presents with pneumonia, week and half ago, went back to ER Friday night and didn't leave until Saturday evening that was how long the weight time was 2 antibiotics one on back order, followed up PCP. Therapist had difficult time tracking and patient agreed not to talk to long because was sick. Have an appointment next week. Therapist encouraged patient to rest and get better. Therapist provided  supportive interventions.   Suicidal/Homicidal: No  Plan: Return again in 1 week.2.Continue to provide strength-based and supportive interventions as patient is struggling with a lot of medical issues continuing her to encourage her to speak up for what she needs, coping  Diagnosis: Axis I: Major depressive disorder, recurrent, moderate, generalized anxiety disorder, chronic PTSD, panic attacks     Axis II: No diagnosis    [name], LCSW 10/20/2020

## 2020-10-23 ENCOUNTER — Telehealth: Payer: Self-pay | Admitting: *Deleted

## 2020-10-23 DIAGNOSIS — G4733 Obstructive sleep apnea (adult) (pediatric): Secondary | ICD-10-CM | POA: Diagnosis not present

## 2020-10-23 NOTE — Telephone Encounter (Signed)
Call from pt - c/o sob, increase heartbeat which "has been going on for awhile" also sweating which statrted this am. Stated she told she has pneumonia then at last telehealth appt here she was told she does not.  Stated she sob while talking to me  I asked pt is she taking her diuretics; she stated yes. But her weight has increased - stated she has been weighing 398 lbs; last weight was Saturday @ 408 lbs. Requesting an appt for Wednesday b/c she rides transportation. Informed pt to go to ED, stated the last time she waited for hours  or UC or ED at [facility] as soon as she can. Stated she understands.

## 2020-10-23 NOTE — Telephone Encounter (Signed)
Thank you. I agree 

## 2020-10-25 ENCOUNTER — Other Ambulatory Visit: Payer: Self-pay | Admitting: Internal Medicine

## 2020-10-25 ENCOUNTER — Other Ambulatory Visit: Payer: Self-pay | Admitting: Gastroenterology

## 2020-10-25 ENCOUNTER — Ambulatory Visit: Payer: Medicare Other | Admitting: Nurse Practitioner

## 2020-10-25 DIAGNOSIS — R42 Dizziness and giddiness: Secondary | ICD-10-CM

## 2020-10-25 DIAGNOSIS — J9620 Acute and chronic respiratory failure, unspecified whether with hypoxia or hypercapnia: Secondary | ICD-10-CM | POA: Diagnosis not present

## 2020-10-27 ENCOUNTER — Ambulatory Visit (HOSPITAL_COMMUNITY): Payer: Medicare Other | Admitting: Licensed Clinical Social Worker

## 2020-10-27 ENCOUNTER — Other Ambulatory Visit: Payer: Self-pay

## 2020-10-27 NOTE — Telephone Encounter (Signed)
Next appt scheduled 11/08/20 with PCP.

## 2020-10-27 NOTE — Progress Notes (Signed)
Therapist contacted patient by phone for session and she did not respond. Session is a no show

## 2020-10-28 ENCOUNTER — Other Ambulatory Visit (HOSPITAL_COMMUNITY): Payer: Self-pay | Admitting: Psychiatry

## 2020-10-28 DIAGNOSIS — F331 Major depressive disorder, recurrent, moderate: Secondary | ICD-10-CM

## 2020-10-29 ENCOUNTER — Other Ambulatory Visit: Payer: Self-pay | Admitting: Pulmonary Disease

## 2020-10-30 ENCOUNTER — Other Ambulatory Visit: Payer: Self-pay | Admitting: Gastroenterology

## 2020-10-30 ENCOUNTER — Other Ambulatory Visit: Payer: Medicare Other

## 2020-10-30 DIAGNOSIS — M25569 Pain in unspecified knee: Secondary | ICD-10-CM | POA: Diagnosis not present

## 2020-10-30 DIAGNOSIS — G629 Polyneuropathy, unspecified: Secondary | ICD-10-CM | POA: Diagnosis not present

## 2020-10-30 DIAGNOSIS — M797 Fibromyalgia: Secondary | ICD-10-CM | POA: Diagnosis not present

## 2020-10-30 DIAGNOSIS — G894 Chronic pain syndrome: Secondary | ICD-10-CM | POA: Diagnosis not present

## 2020-10-31 ENCOUNTER — Other Ambulatory Visit: Payer: Self-pay

## 2020-11-05 DIAGNOSIS — J9620 Acute and chronic respiratory failure, unspecified whether with hypoxia or hypercapnia: Secondary | ICD-10-CM | POA: Diagnosis not present

## 2020-11-07 ENCOUNTER — Other Ambulatory Visit: Payer: Self-pay | Admitting: Gastroenterology

## 2020-11-08 ENCOUNTER — Encounter: Payer: Self-pay | Admitting: Internal Medicine

## 2020-11-08 ENCOUNTER — Ambulatory Visit (INDEPENDENT_AMBULATORY_CARE_PROVIDER_SITE_OTHER): Payer: Medicare Other | Admitting: Internal Medicine

## 2020-11-08 ENCOUNTER — Other Ambulatory Visit: Payer: Self-pay

## 2020-11-08 VITALS — BP 110/70 | HR 97 | Temp 98.9°F | Ht 70.0 in | Wt 392.5 lb

## 2020-11-08 DIAGNOSIS — B351 Tinea unguium: Secondary | ICD-10-CM | POA: Diagnosis not present

## 2020-11-08 DIAGNOSIS — Z23 Encounter for immunization: Secondary | ICD-10-CM

## 2020-11-08 DIAGNOSIS — E538 Deficiency of other specified B group vitamins: Secondary | ICD-10-CM

## 2020-11-08 DIAGNOSIS — R42 Dizziness and giddiness: Secondary | ICD-10-CM

## 2020-11-08 DIAGNOSIS — B3731 Acute candidiasis of vulva and vagina: Secondary | ICD-10-CM | POA: Diagnosis not present

## 2020-11-08 DIAGNOSIS — N912 Amenorrhea, unspecified: Secondary | ICD-10-CM | POA: Diagnosis not present

## 2020-11-08 DIAGNOSIS — I10 Essential (primary) hypertension: Secondary | ICD-10-CM | POA: Diagnosis not present

## 2020-11-08 DIAGNOSIS — E1121 Type 2 diabetes mellitus with diabetic nephropathy: Secondary | ICD-10-CM | POA: Diagnosis not present

## 2020-11-08 DIAGNOSIS — R413 Other amnesia: Secondary | ICD-10-CM

## 2020-11-08 LAB — POCT URINE PREGNANCY: Preg Test, Ur: NEGATIVE

## 2020-11-08 LAB — GLUCOSE, CAPILLARY: Glucose-Capillary: 112 mg/dL — ABNORMAL HIGH (ref 70–99)

## 2020-11-08 LAB — POCT GLYCOSYLATED HEMOGLOBIN (HGB A1C): Hemoglobin A1C: 6 % — AB (ref 4.0–5.6)

## 2020-11-08 MED ORDER — FLUCONAZOLE 150 MG PO TABS: ORAL_TABLET | ORAL | 0 refills | Status: DC

## 2020-11-08 MED ORDER — MECLIZINE HCL 25 MG PO TABS: ORAL_TABLET | ORAL | 2 refills | Status: DC

## 2020-11-08 MED ORDER — SEMAGLUTIDE (1 MG/DOSE) 4 MG/3ML ~~LOC~~ SOPN: 1.0000 mg | PEN_INJECTOR | SUBCUTANEOUS | 1 refills | Status: DC

## 2020-11-08 NOTE — Assessment & Plan Note (Signed)
Patient reports recent worsening memory problems. She has difficulty scheduling and managing her doctors appointments and has missed multiple appointments. She reports she will just forget that an appointment is scheduled until her transportation shows up. She also reports issues with forgetting doses of her medications. She does not drive and lives with her daughter and mom who have also noticed issues with her memory. She does not leave the house except for her doctors appointments. MOCA was performed today with a total score of 24. I am checking a TSH and vitamin B12 today to rule out any possible pseudodementia contributing factors. We discussed that this is likely normal age related memory loss vs. Mild cognitive impairment. Will monitor for now.

## 2020-11-08 NOTE — Assessment & Plan Note (Signed)
Ambulatory referral to podiatry.

## 2020-11-08 NOTE — Assessment & Plan Note (Signed)
Patient has chronic intermittent dizziness from prior cerebellar strokes. I have refilled her PRN meclizine. Previously discussed referred to vestibular rehab for treatment of her central dizziness and also to definitively rule out peripheral causes, however she was never called to schedule. I do not see a prior referral order on chart review. New order has been placed.

## 2020-11-08 NOTE — Assessment & Plan Note (Signed)
Patient reports irregular menses. Her last menstrual cycle was two months ago. She reports unprotected sexual intercourse last month. POC urine pregnancy test today was negative. Discussed with patient that she is likely perimenopausal. Offered STI screening, patient declined.

## 2020-11-08 NOTE — Progress Notes (Signed)
Subjective:   Patient ID: Lindsay [name] female   DOB: 22-Apr-1970 50 y.o.   MRN: [id number]  HPI: [name] is a 50 y.o. female with past medical history outlined below here for follow up of her chronic medical conditions. For the details of today's visit, please refer to the assessment and plan.    Past Medical History:  Diagnosis Date   Acid reflux    Anemia    Iron Def   Anorexia    CHF (congestive heart failure) (HCC)    Chronic kidney disease    Nephrotic syndrome   Colon polyp 2009   Depression with anxiety    Edema leg    Fibromyalgia    Hemorrhoids    Hidradenitis suppurativa    Hypertension    IBS (irritable bowel syndrome)    Low back pain    Migraines    Morbidly obese (HCC)    Neuromuscular disorder (HCC)    fibromyalgia   Neuropathy    Panic attacks    Polyp of vocal cord or larynx    Tonsil pain    Current Outpatient Medications  Medication Sig Dispense Refill   fluconazole (DIFLUCAN) 150 MG tablet Please take 1 tablet by mouth. Repeat in 3 days if symptoms persist. 2 tablet 0   Semaglutide, 1 MG/DOSE, 4 MG/3ML SOPN Inject 1 mg into the skin once a week. 3 mL 1   ACCU-CHEK GUIDE test strip USE ONE TEST STRIP TO CHECK BLOOD SUGARS ONCE A DAY AS NEEDED 100 strip 1   Accu-Chek Softclix Lancets lancets USE ONE LANCET TO CHECK BLOOD SUGAR ONE A DAY AS NEEDED 100 each 1   ADDYI 100 MG TABS TAKE 1 TABLET BY MOUTH EVERY DAY 30 tablet 5   albuterol (PROVENTIL) (2.5 MG/3ML) 0.083% nebulizer solution Take 3 mLs (2.5 mg total) by nebulization every 4 (four) hours as needed for wheezing or shortness of breath. 120 mL 0   albuterol (VENTOLIN HFA) 108 (90 Base) MCG/ACT inhaler TAKE 2 PUFFS BY MOUTH EVERY 6 HOURS AS NEEDED FOR WHEEZE OR SHORTNESS OF BREATH (Patient taking differently: Inhale 2 puffs into the lungs every 6 (six) hours as needed for wheezing or shortness of breath.) 6.7 each 3   Ascorbic Acid (VITAMIN C PO) Take 1 tablet by mouth daily.      atorvastatin (LIPITOR) 40 MG tablet Take 1 tablet (40 mg total) by mouth daily. (Patient taking differently: Take 80 mg by mouth daily.) 90 tablet 1   BIOTIN PO Take 1 tablet by mouth daily.     Blood Glucose Monitoring Suppl (ACCU-CHEK GUIDE ME) w/Device KIT Dispense one device 1 kit 0   Brexpiprazole (REXULTI) 3 MG TABS Take 1 tablet (3 mg total) by mouth daily. 30 tablet 2   Capsaicin 0.025 % PTCH Apply 1 patch topically daily as needed. 10 patch 9   cetirizine (ZYRTEC) 10 MG tablet TAKE 1 TABLET BY MOUTH EVERY DAY 90 tablet 1   cholecalciferol (VITAMIN D) 25 MCG (1000 UNIT) tablet TAKE 1 TABLET BY MOUTH EVERY DAY 90 tablet 1   clindamycin (CLINDAGEL) 1 % gel APPLY TO AFFECTED AREA TWICE A DAY 30 g 0   clorazepate (TRANXENE) 3.75 MG tablet Take 1 tablet (3.75 mg total) by mouth daily. 30 tablet 2   diclofenac Sodium (VOLTAREN) 1 % GEL APPLY 2 GRAMS TOPICALLY 4 (FOUR) TIMES DAILY AS NEEDED FOR KNEE PAIN 400 g 1   doxycycline (VIBRA-TABS) 100 MG tablet Take 1 tablet (100 mg total) by  mouth 2 (two) times daily. 14 tablet 0   ENTRESTO 49-51 MG Take 1 tablet by mouth 2 (two) times daily.     famotidine (PEPCID) 20 MG tablet TAKE 1 TABLET BY MOUTH EVERY DAY 90 tablet 1   gabapentin (NEURONTIN) 300 MG capsule Take 3 capsules (900 mg total) by mouth at bedtime. (Patient not taking: Reported on 10/16/2020) 270 capsule 0   ipratropium (ATROVENT) 0.06 % nasal spray PLEASE SEE ATTACHED FOR DETAILED DIRECTIONS     lamoTRIgine (LAMICTAL) 150 MG tablet Take 1 tablet (150 mg total) by mouth daily. 90 tablet 0   lidocaine (LIDODERM) 5 % Place 2 patches onto the skin daily. Remove & Discard patch within 12 hours or as directed by MD 30 patch 0   lidocaine (XYLOCAINE) 5 % ointment Apply 1 application topically as needed for mild pain. 50 g 0   meclizine (ANTIVERT) 25 MG tablet TAKE 1 TABLET BY MOUTH 3 TIMES DAILY AS NEEDED FOR DIZZINESS. 90 tablet 2   megestrol (MEGACE) 40 MG tablet TAKE 2 TABLETS BY MOUTH EVERY  DAY 180 tablet 1   metFORMIN (GLUCOPHAGE-XR) 500 MG 24 hr tablet TAKE 2 TABLETS BY MOUTH EVERY DAY WITH BREAKFAST 180 tablet 0   metoprolol succinate (TOPROL-XL) 50 MG 24 hr tablet Take 50 mg by mouth daily.     mirtazapine (REMERON) 30 MG tablet Take 1 tablet (30 mg total) by mouth at bedtime. 30 tablet 1   nortriptyline (PAMELOR) 25 MG capsule Take 25 mg by mouth 3 (three) times daily.     omeprazole (PRILOSEC) 40 MG capsule TAKE 1 CAPSULE BY MOUTH EVERY DAY 90 capsule 3   OZEMPIC, 0.25 OR 0.5 MG/DOSE, 2 MG/1.5ML SOPN INJECT 0.5 MG INTO THE SKIN ONCE A WEEK. 1.5 mL 2   promethazine (PHENERGAN) 12.5 MG tablet TAKE 1 TABLET BY MOUTH EVERY 6 HOURS AS NEEDED. 15 tablet 0   SYMBICORT 160-4.5 MCG/ACT inhaler INHALE 2 PUFFS INTO THE LUNGS IN THE MORNING AND AT BEDTIME. 10.2 each 0   tiZANidine (ZANAFLEX) 4 MG tablet Take 4 mg by mouth 3 (three) times daily as needed.     torsemide (DEMADEX) 20 MG tablet TAKE 2 TABLETS BY MOUTH TWICE A DAY 360 tablet 1   vitamin B-12 (CYANOCOBALAMIN) 1000 MCG tablet Take 1 tablet (1,000 mcg total) by mouth daily. 30 tablet 3   Vitamin E 400 units TABS Take 400 Units by mouth daily.      No current facility-administered medications for this visit.   Family History  Problem Relation Age of Onset   Diabetes Mother    Heart disease Mother        valve leak   Anxiety disorder Mother    Depression Mother    High blood pressure Mother    Kidney failure Mother    Cancer Father        prostate   Heart disease Father    Learning disabilities Sister    Depression Sister    Depression Sister    Anxiety disorder Sister    Colon cancer Neg Hx    Social History   Socioeconomic History   Marital status: Single    Spouse name: Not on file   Number of children: 1   Years of education: 11   Highest education level: Not on file  Occupational History   Occupation: Disabled  Tobacco Use   Smoking status: Former    Packs/day: 0.10    Years: 25.00    Pack years:  2.50    Types: Cigarettes    Quit date: 05/24/2019    Years since quitting: 1.4   Smokeless tobacco: Never   Tobacco comments:    quit in April.  Vaping Use   Vaping Use: Never used  Substance and Sexual Activity   Alcohol use: Not Currently    Alcohol/week: 0.0 standard drinks    Comment: none sine 05/2018   Drug use: Not Currently    Frequency: 3.0 times per week    Types: Marijuana   Sexual activity: Not Currently    Birth control/protection: Implant  Other Topics Concern   Not on file  Social History Narrative   Lives at home w/ her mother and daughter   Right-handed   Caffeine: about 3 Cokes per week   Social Determinants of Health   Financial Resource Strain: Not on file  Food Insecurity: Not on file  Transportation Needs: Not on file  Physical Activity: Not on file  Stress: Not on file  Social Connections: Not on file    Review of Systems: Review of Systems  Constitutional:  Negative for chills and fever.  Respiratory:  Negative for cough.     Objective:  Physical Exam:  Vitals:   11/08/20 0857  BP: 110/70  Pulse: 97  Temp: 98.9 F (37.2 C)  TempSrc: Oral  SpO2: 95%  Weight: (!) 392 lb 8 oz (178 kg)  Height: $Remove'5\' 10"'nZRMTDb$  (1.778 m)    Physical Exam Constitutional:      Appearance: Normal appearance. She is obese.  Cardiovascular:     Rate and Rhythm: Normal rate and regular rhythm.  Pulmonary:     Effort: Pulmonary effort is normal. No respiratory distress.     Breath sounds: Normal breath sounds.  Skin:    General: Skin is warm and dry.  Neurological:     General: No focal deficit present.     Mental Status: She is alert and oriented to person, place, and time.  Psychiatric:        Mood and Affect: Mood normal.        Behavior: Behavior normal.     Assessment & Plan:   See Encounters Tab for problem based charting.

## 2020-11-08 NOTE — Assessment & Plan Note (Signed)
Well controlled, Hgb A1c is down to 6.0 today after initiation of ozempic. We are up titrating this for maximal weight loss effect. Will plan to d/c metformin today. Increase ozempic to 1 mg once weekly.

## 2020-11-08 NOTE — Assessment & Plan Note (Signed)
Recently completed and antibiotic course for PNA. Reports vaginal discharge and itching. Treating empirically for vaginal yeast infection with oral fluconazole.

## 2020-11-08 NOTE — Patient Instructions (Signed)
Follow up with me again in 1 month. If you have any questions or concerns, call our clinic at [phone] or after hours call [phone] and ask for the internal medicine resident on call.  Thank you!  Dr. Darnell Level

## 2020-11-08 NOTE — Assessment & Plan Note (Signed)
Chronic and well controlled. Continue metoprolol 50 mg BID and entresto 49-51 mg. BMP last month with normal renal function.

## 2020-11-08 NOTE — Assessment & Plan Note (Signed)
Doing well on injection semaglutide. Has lost about 8 lbs since starting the medication. Reports decreased appetite. Weight today is 392 lbs with a BMI of 56.3. Will continue to up titrate ozempic as tolerated. Increase to 1 mg weekly. Follow up 1 month.

## 2020-11-09 ENCOUNTER — Other Ambulatory Visit: Payer: Medicare Other

## 2020-11-09 LAB — VITAMIN B12: Vitamin B-12: 533 pg/mL (ref 232–1245)

## 2020-11-09 LAB — TSH: TSH: 1.57 u[IU]/mL (ref 0.450–4.500)

## 2020-11-15 ENCOUNTER — Other Ambulatory Visit: Payer: Self-pay

## 2020-11-15 ENCOUNTER — Ambulatory Visit (INDEPENDENT_AMBULATORY_CARE_PROVIDER_SITE_OTHER): Payer: Medicare Other | Admitting: Licensed Clinical Social Worker

## 2020-11-15 DIAGNOSIS — F411 Generalized anxiety disorder: Secondary | ICD-10-CM

## 2020-11-15 DIAGNOSIS — F410 Panic disorder [episodic paroxysmal anxiety] without agoraphobia: Secondary | ICD-10-CM | POA: Diagnosis not present

## 2020-11-15 DIAGNOSIS — F331 Major depressive disorder, recurrent, moderate: Secondary | ICD-10-CM

## 2020-11-15 DIAGNOSIS — F4312 Post-traumatic stress disorder, chronic: Secondary | ICD-10-CM

## 2020-11-15 NOTE — Progress Notes (Signed)
Virtual Visit via Telephone Note  I connected with Lindsay [name] on 11/15/20 at  8:00 AM EDT by telephone and verified that I am speaking with the correct person using two identifiers.  Location: Patient: home Provider: home office   I discussed the limitations, risks, security and privacy concerns of performing an evaluation and management service by telephone and the availability of in person appointments. I also discussed with the patient that there may be a patient responsible charge related to this service. The patient expressed understanding and agreed to proceed.   I discussed the assessment and treatment plan with the patient. The patient was provided an opportunity to ask questions and all were answered. The patient agreed with the plan and demonstrated an understanding of the instructions.   The patient was advised to call back or seek an in-person evaluation if the symptoms worsen or if the condition fails to improve as anticipated.  I provided 52 minutes of non-face-to-face time during this encounter.  THERAPIST PROGRESS NOTE  Session Time: 8:00 AM to 8:52 AM  Participation Level: Active  Behavioral Response: CasualAlertAnxious  Type of Therapy: Individual Therapy  Treatment Goals addressed:  help cope with health issues, anxiety, triggers, coping, utilize therapy as a way for patient to focus on working through current stressors that also helps to distract from pain. Interventions: Solution Focused, Strength-based, Supportive, and Other: coping  Summary: Lindsay [name] is a 50 y.o. female who presents with mom went to the hospital couldn't breath, sweating, couldn't talk. Her heart works 60%, fluid build up. Her sides hurt and sick to stomach. Have seen her better. Will know plan better when see her doctor. She is getting around but not eating. Has to go to kidney specialist. Patient relates she is dealing with a lot right now. Went to PCP ear and throat hurting having  migraines. Frustrated with doctor's response that with ear and throat going around now patient said wants to address specifically what is going on with her. Patient management gave her a pain patch and not working. Pain pills but insurance won't pay from pain clinic. Come up with pain patch. Not working going to ask to increase. It scared her, with mix of her medications slow down breathing. Therapist agreed good idea to check with doctor about interactions between medications to be sure it is ok. Pain doctor stopped the nortriptyline. Session transitioned to talking about other subjects such as interests in shows. This led to talking about things that spook [name]. Patient relates that it can spook her the reality of things, people who look at her a certain way and creep her out, tries to not look at the news too much is overwhelming. Watching certain things knows herself and will cry, spiral and have a panic attack. Like Lindsay [name] can't watch things like that. Starving people, has a soft heart. Something sad has a soft heart. Didn't used to be like that but maybe softened. Explored how we cope with this and patient said not fully watch those things, pray.  Therapist pointed out in conversation intuition helpful for things that spoke [name] out and also positive aspects of having a soft heart can relate more with people, can experience things more fully.  Therapist reviewed symptoms, facilitated expression of thoughts and feelings as treatment intervention to help patient and working through feelings related to stressors of medical issues.  Provided some positive feedback for appears to be at least initial steps of positive movement with medications for pain although explored  this and therapist provided positive feedback for patient's caution in checking with her doctors about possible interactions with medications noting this as making sure to take care of her mental medical health.  Explored options to continue to address  pain issues through her doctors that would continue to move things forward for her.  Validated patient on continued struggles with healthcare system and getting what she needs at the same time.  Discussed other stressors a strategy for coping with the stressor validating patient on challenges of the stressors, managing life in general challenges that brings.  Therapist pointing out the commonality of the stress of life can be helpful to share as you know you are not the only one struggling and that can be helpful.  Discussed coping with challenges of life noted patient's coping of not exposing herself too much to negative information, spirituality.  Therapist also encourage patient finding in her life ways to experience some joy provided some examples to counter the negativity.  Therapist provided active listening open questions, supportive interventions. Suicidal/Homicidal: No  Plan: Return again in 2 weeks.2.Continue to provide strength-based and supportive interventions as patient is struggling with a lot of medical issues continuing her to encourage her to speak up for what she needs, coping, stress management  Diagnosis: Axis I: Major depressive disorder, recurrent, moderate, generalized anxiety disorder, chronic PTSD, panic attacks     Axis II: No diagnosis    [name], LCSW 11/15/2020

## 2020-11-20 ENCOUNTER — Ambulatory Visit: Payer: Medicare Other | Admitting: Podiatry

## 2020-11-23 ENCOUNTER — Other Ambulatory Visit (HOSPITAL_COMMUNITY): Payer: Self-pay | Admitting: Psychiatry

## 2020-11-23 DIAGNOSIS — F331 Major depressive disorder, recurrent, moderate: Secondary | ICD-10-CM

## 2020-11-24 ENCOUNTER — Other Ambulatory Visit: Payer: Self-pay | Admitting: Gastroenterology

## 2020-11-24 DIAGNOSIS — J9620 Acute and chronic respiratory failure, unspecified whether with hypoxia or hypercapnia: Secondary | ICD-10-CM | POA: Diagnosis not present

## 2020-11-25 ENCOUNTER — Other Ambulatory Visit: Payer: Self-pay | Admitting: Gastroenterology

## 2020-11-29 ENCOUNTER — Other Ambulatory Visit (HOSPITAL_COMMUNITY): Payer: Self-pay | Admitting: Psychiatry

## 2020-11-29 DIAGNOSIS — Z79891 Long term (current) use of opiate analgesic: Secondary | ICD-10-CM | POA: Diagnosis not present

## 2020-11-29 DIAGNOSIS — G894 Chronic pain syndrome: Secondary | ICD-10-CM | POA: Diagnosis not present

## 2020-11-29 DIAGNOSIS — Z79899 Other long term (current) drug therapy: Secondary | ICD-10-CM | POA: Diagnosis not present

## 2020-11-29 DIAGNOSIS — F331 Major depressive disorder, recurrent, moderate: Secondary | ICD-10-CM

## 2020-11-30 ENCOUNTER — Telehealth: Payer: Medicare Other | Admitting: Family Medicine

## 2020-11-30 ENCOUNTER — Ambulatory Visit (HOSPITAL_COMMUNITY): Payer: Medicare Other | Admitting: Licensed Clinical Social Worker

## 2020-11-30 DIAGNOSIS — F410 Panic disorder [episodic paroxysmal anxiety] without agoraphobia: Secondary | ICD-10-CM

## 2020-11-30 DIAGNOSIS — F411 Generalized anxiety disorder: Secondary | ICD-10-CM

## 2020-11-30 DIAGNOSIS — F331 Major depressive disorder, recurrent, moderate: Secondary | ICD-10-CM

## 2020-11-30 DIAGNOSIS — F4312 Post-traumatic stress disorder, chronic: Secondary | ICD-10-CM

## 2020-11-30 NOTE — Progress Notes (Signed)
Summary: Lindsay [name] is a 50 y.o. female who presents with don't feel well head feels dizzy in pain. Head under covers, eyes closed. Has to go to a vestibular doctor. Therapist and patient agreed not to have session as patient not feeling well.   [name], LCSW 11/30/2020

## 2020-12-06 DIAGNOSIS — J9620 Acute and chronic respiratory failure, unspecified whether with hypoxia or hypercapnia: Secondary | ICD-10-CM | POA: Diagnosis not present

## 2020-12-08 ENCOUNTER — Other Ambulatory Visit: Payer: Self-pay

## 2020-12-08 ENCOUNTER — Telehealth (HOSPITAL_BASED_OUTPATIENT_CLINIC_OR_DEPARTMENT_OTHER): Payer: Medicare Other | Admitting: Psychiatry

## 2020-12-08 ENCOUNTER — Encounter (HOSPITAL_COMMUNITY): Payer: Self-pay | Admitting: Psychiatry

## 2020-12-08 DIAGNOSIS — F4312 Post-traumatic stress disorder, chronic: Secondary | ICD-10-CM

## 2020-12-08 DIAGNOSIS — F331 Major depressive disorder, recurrent, moderate: Secondary | ICD-10-CM | POA: Diagnosis not present

## 2020-12-08 DIAGNOSIS — F410 Panic disorder [episodic paroxysmal anxiety] without agoraphobia: Secondary | ICD-10-CM | POA: Diagnosis not present

## 2020-12-08 MED ORDER — LAMOTRIGINE 150 MG PO TABS: 150.0000 mg | ORAL_TABLET | Freq: Every day | ORAL | 0 refills | Status: DC

## 2020-12-08 MED ORDER — MIRTAZAPINE 30 MG PO TABS: 30.0000 mg | ORAL_TABLET | Freq: Every day | ORAL | 0 refills | Status: DC

## 2020-12-08 MED ORDER — CLORAZEPATE DIPOTASSIUM 3.75 MG PO TABS: 3.7500 mg | ORAL_TABLET | Freq: Every day | ORAL | 2 refills | Status: DC

## 2020-12-08 MED ORDER — REXULTI 3 MG PO TABS: 3.0000 mg | ORAL_TABLET | Freq: Every day | ORAL | 0 refills | Status: DC
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
## 2020-12-12 ENCOUNTER — Telehealth (HOSPITAL_COMMUNITY): Payer: Medicare Other | Admitting: Psychiatry

## 2020-12-13 ENCOUNTER — Other Ambulatory Visit: Payer: Self-pay | Admitting: Pulmonary Disease

## 2020-12-14 ENCOUNTER — Other Ambulatory Visit: Payer: Self-pay | Admitting: Gastroenterology

## 2020-12-15 ENCOUNTER — Ambulatory Visit (INDEPENDENT_AMBULATORY_CARE_PROVIDER_SITE_OTHER): Payer: Medicare Other | Admitting: Licensed Clinical Social Worker

## 2020-12-15 DIAGNOSIS — F410 Panic disorder [episodic paroxysmal anxiety] without agoraphobia: Secondary | ICD-10-CM

## 2020-12-15 DIAGNOSIS — F411 Generalized anxiety disorder: Secondary | ICD-10-CM | POA: Diagnosis not present

## 2020-12-15 DIAGNOSIS — F331 Major depressive disorder, recurrent, moderate: Secondary | ICD-10-CM

## 2020-12-15 DIAGNOSIS — F4312 Post-traumatic stress disorder, chronic: Secondary | ICD-10-CM | POA: Diagnosis not present
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
## 2020-12-22 ENCOUNTER — Other Ambulatory Visit: Payer: Self-pay | Admitting: Internal Medicine

## 2020-12-25 NOTE — Telephone Encounter (Signed)
Refill request for metformin declined. This was discontinued at her most recent office visit. If she would like to continue this medication, she can discuss with Dr. Saverio Danker at her appointment on Wednesday.  Thank you

## 2020-12-27 ENCOUNTER — Encounter: Payer: Medicare Other | Admitting: Internal Medicine

## 2020-12-28 ENCOUNTER — Ambulatory Visit (INDEPENDENT_AMBULATORY_CARE_PROVIDER_SITE_OTHER): Payer: Medicare Other | Admitting: Licensed Clinical Social Worker

## 2020-12-28 DIAGNOSIS — F331 Major depressive disorder, recurrent, moderate: Secondary | ICD-10-CM | POA: Diagnosis not present

## 2020-12-28 DIAGNOSIS — F411 Generalized anxiety disorder: Secondary | ICD-10-CM | POA: Diagnosis not present

## 2020-12-28 DIAGNOSIS — F410 Panic disorder [episodic paroxysmal anxiety] without agoraphobia: Secondary | ICD-10-CM

## 2020-12-28 DIAGNOSIS — F4312 Post-traumatic stress disorder, chronic: Secondary | ICD-10-CM

## 2020-12-28 NOTE — Progress Notes (Signed)
Virtual Visit via Telephone Note  I connected with [name] on 12/28/20 at  8:00 AM EST by telephone and verified that I am speaking with the correct person using two identifiers.  Location: Patient: home Provider: home office   I discussed the limitations, risks, security and privacy concerns of performing an evaluation and management service by telephone and the availability of in person appointments. I also discussed with the patient that there may be a patient responsible charge related to this service. The patient expressed understanding and agreed to proceed.   I discussed the assessment and treatment plan with the patient. The patient was provided an opportunity to ask questions and all were answered. The patient agreed with the plan and demonstrated an understanding of the instructions.   The patient was advised to call back or seek an in-person evaluation if the symptoms worsen or if the condition fails to improve as anticipated.  I provided 30 minutes of non-face-to-face time during this encounter.  THERAPIST PROGRESS NOTE  Session Time: 8:00 AM to 8:30 AM  Participation Level: Active  Behavioral Response: CasualAlertDepressed patient in significant pain which has significant impact on mental health  Type of Therapy: Individual Therapy  Treatment Goals addressed:  Patient continues to find therapy helpful for supportive and strength-based interventions as well as continue to focus help cope with health issues, anxiety, triggers, coping, utilize therapy as a way for patient to focus on working through current stressors that also helps to distract from pain Interventions: Solution Focused, Strength-based, Supportive, and Other: coping  Summary: Lindsay [name] is a 50 y.o. female who presents when therapist asked her how she is doing says "have been better". Body aches excruciating pain when it gets cold, body gets stiff and whole body aches would rate pain at 10 or 20 if  there was such a thing. Did nerve conduction and they find peripheral neuropathy, do a lot of tests come back with arthritis and neuropathy same stuff. Got on pain patch. MRI for leg on 12th. Same outcome so frustrating and overwhelming. Keeps sitting up on left and right side. Keeps going back and they are not doing anything. Tomorrow be in pain like all the time and her birthday. Going to be in bed not going to do a celebration because can't and don't feel good. Sick of telling doctors in pain and need help. Ask please think of something that haven't thought of because she thinks they haven't found what is wrong. They are thinking of the weight. For the doctors it is all about weight patient telling there is something else going on.  Therapist validated this by saying we often will more than anyone or something going on with [name].  This express not feeling helpful for patient patient relates that she gets mental care from therapist that keeps her going.  As noted this is an important aspect of how she herself lives with her life. Talked about addressing issues and they can't do surgeries because of high risk not even oral surgery and has teeth in her gum.  Therapist explored who cheers her up and patient gave therapist updates daughter has not been here boyfriend blood clot on brain and having seizures and strokes he is 63. He has to learn to talk, walk eat. Daughter not there at the hospital with him. Therapist and patient agree in cautioning that it is overwhelming and be careful can burn her out while at the same time she is trying to work. Continued to  explore who makes her feel better doesn't even talk to people at the church. Waiting list for Meals on Wheels.therapist will helpful as consistently checks in with her.  As we were talking patient expressed frustration.wishes they would be quiet sister and Mom and not considering her being on the phone. They do come in sometimes to check in her.  Therapist  wondered if it would be better to happen early and patient agreed saying she doesn't feel well like got to throw up really badly   Therapist reviewed symptoms, facilitated expression of thoughts and feelings, reviewed treatment plan patient gave consent to complete virtually and therapist reviewed patient's medical issues and feeling not very helpful with that aspect.  Patient noting therapist very much helpful in her coping, the mental health aspect as something that is helpful for her.  Validated patient on frustration feeling overwhelmed with ongoing pain issues where nobody can get to the bottom of what is going on for her.  Therapist provided her perspective on life of being determined, if you do not figure out something one way then try another way as her philosophy in  life and apply it to patient as well.  "There is always a go around" which means continue to look for answers continue to look for treatments that will be helpful until she finds what she needs.  Did important aspects of support therapist checking in with her regularly and also strength-based interventions for patient.  As therapist is got to know patient more really recognizes her strengths 1 way that comes out with her ability use words to describe things so accurately and so descriptively.  Noted her birthday is tomorrow will be difficult 1 and therapist said put that on hold till she is ready to celebrate.  Assess its positive that she continues to get testing to find something that we will help her.  Was able to get more updates of what is going family on that is helpful in strengthening relationship.  Explored who is shares her up patient not able to identify which again suggests significance of therapist role as regular check-in for patient.  Therapist provided active listening open questions, supportive interventions. Suicidal/Homicidal: No  Plan: Return again in 2 weeks.2.Patient and therapist look for resources for her for  furniture and mattress. 3.Continue to provide strength-based and supportive interventions as patient is struggling with a lot of medical issues continuing her to encourage her to speak up for what she needs, coping, stress management  Diagnosis: Axis I: Major depressive disorder, recurrent, moderate, generalized anxiety disorder, chronic PTSD, panic attacks    Axis II: No diagnosis    [name], LCSW 12/28/2020

## 2020-12-29 ENCOUNTER — Ambulatory Visit (HOSPITAL_COMMUNITY): Payer: Medicare Other | Admitting: Licensed Clinical Social Worker

## 2021-01-04 ENCOUNTER — Other Ambulatory Visit: Payer: Self-pay | Admitting: Gastroenterology

## 2021-01-08 ENCOUNTER — Ambulatory Visit
Admission: RE | Admit: 2021-01-08 | Discharge: 2021-01-08 | Disposition: A | Discharge status: Home / Self Care | Payer: Medicare Other | Source: Ambulatory Visit | Attending: Physician Assistant | Admitting: Physician Assistant

## 2021-01-08 ENCOUNTER — Other Ambulatory Visit: Payer: Self-pay

## 2021-01-08 ENCOUNTER — Ambulatory Visit: Payer: Medicare Other | Admitting: Nurse Practitioner

## 2021-01-08 ENCOUNTER — Ambulatory Visit: Payer: Medicare Other | Admitting: Physician Assistant

## 2021-01-08 DIAGNOSIS — M25561 Pain in right knee: Secondary | ICD-10-CM

## 2021-01-08 DIAGNOSIS — M25562 Pain in left knee: Secondary | ICD-10-CM

## 2021-01-08 IMAGING — MR MR KNEE*L* W/O CM
6 series · 38 of 40 positions shown · non-contrast
Comparison: Left knee radiograph [DATE]

CLINICAL DATA: Left knee pain

EXAM:
MRI OF THE LEFT KNEE WITHOUT CONTRAST
TECHNIQUE: Multiplanar, multisequence MR imaging of the knee was performed. No
intravenous contrast was administered.

[Series 4: T2 fat-sat · axial · left · 4.0mm · 0.50mm/px · z∈[-20,+134]mm · 7 of 36 slices shown (1 of 3)]
[im 1/36]
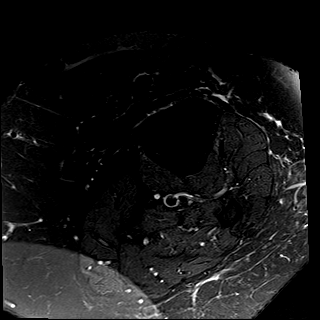
[im 6/36]
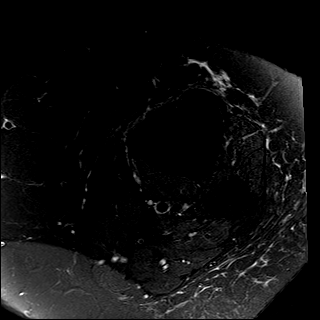
[im 12/36]
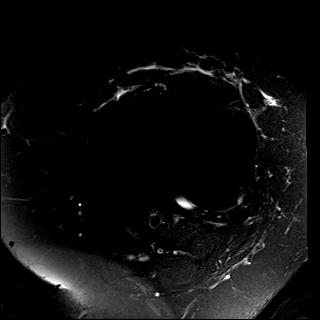
[im 18/36]
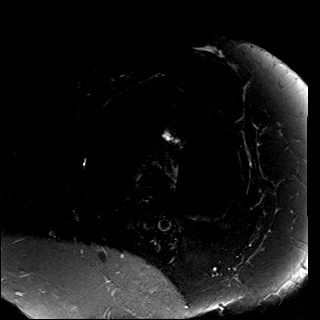
[im 24/36]
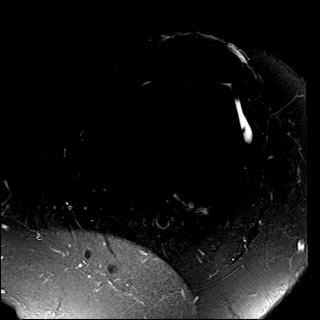
[im 30/36]
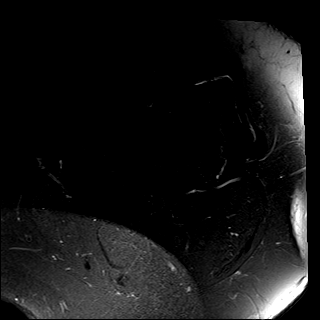
[im 36/36]
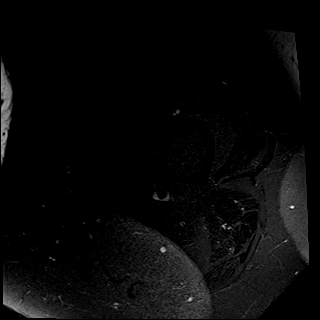

[Series 6: PD fat-sat · coronal · left · 3.0mm · 0.47mm/px · 7 of 31 slices shown (1 of 2)]
[im 1/31]
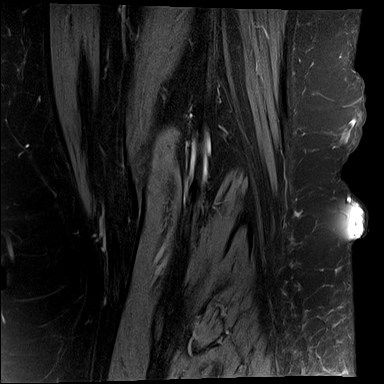
[im 6/31]
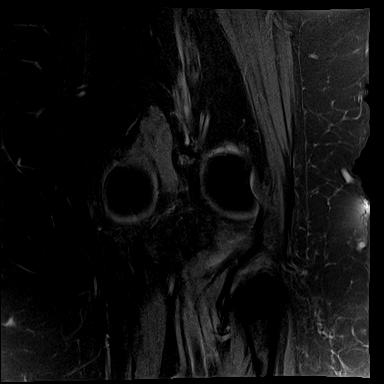
[im 11/31]
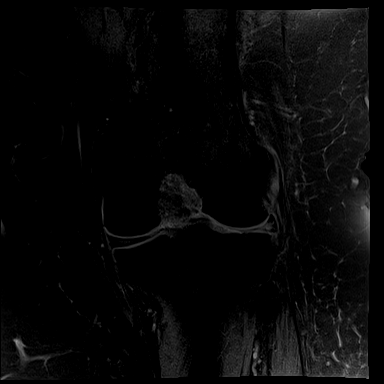
[im 16/31]
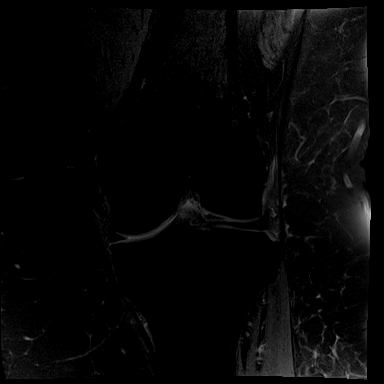
[im 21/31]
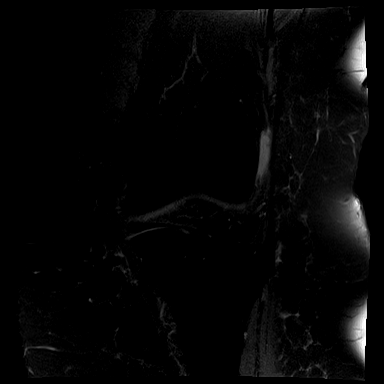
[im 26/31]
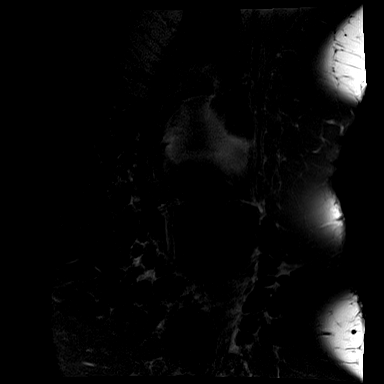
[im 31/31]
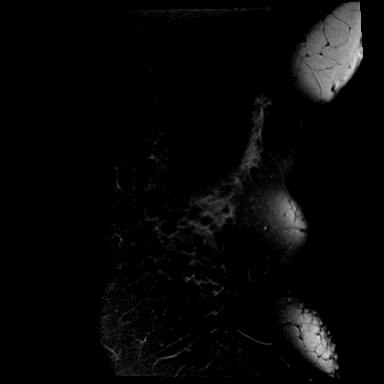

[Series 7: T1 · coronal · left · 3.0mm · 0.47mm/px · 5 of 31 slices shown]
[im 1/31]
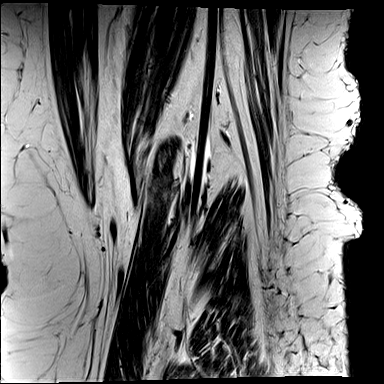
[im 6/31]
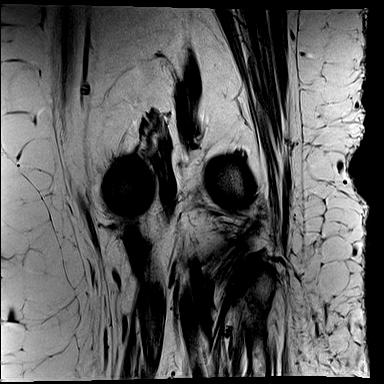
[im 11/31]
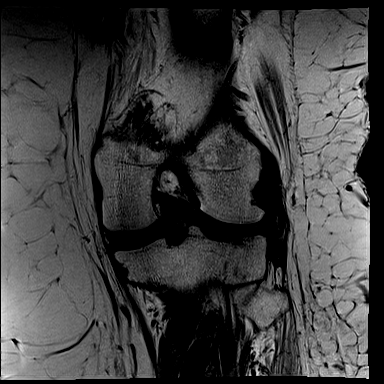
[im 16/31]
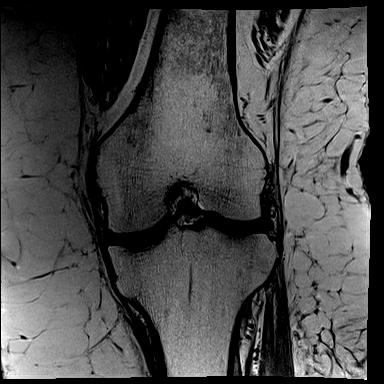
[im 21/31]
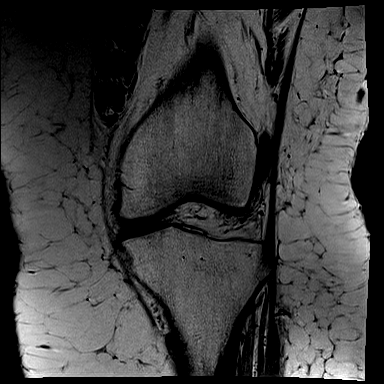

[Series 8: T2 fat-sat · coronal · left · 3.0mm · 0.47mm/px · 7 of 31 slices shown (2 of 3)]
[im 1/31]
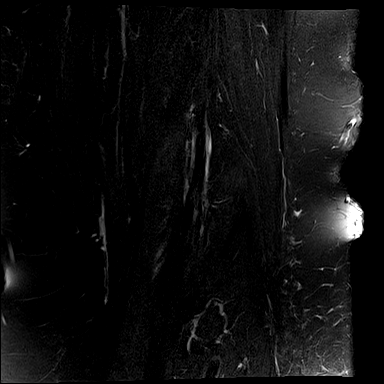
[im 6/31]
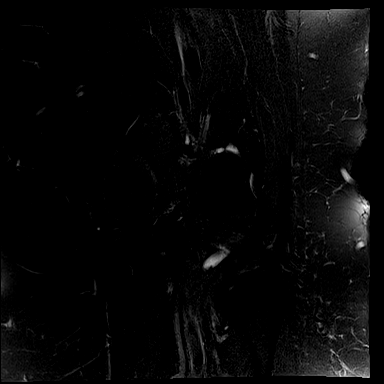
[im 11/31]
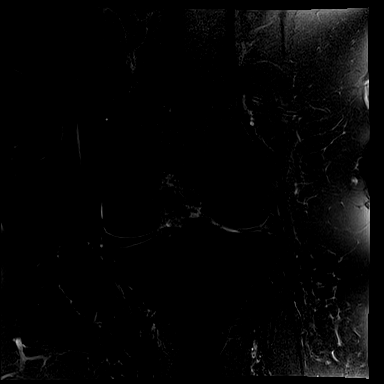
[im 16/31]
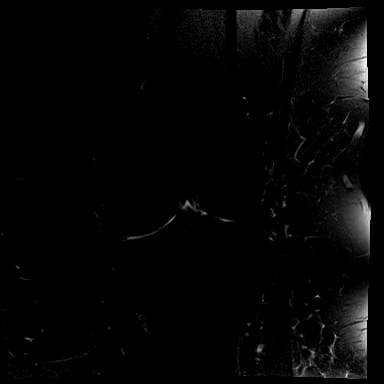
[im 21/31]
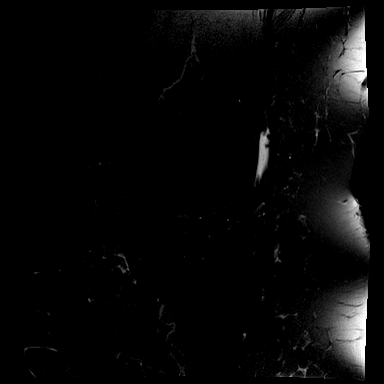
[im 26/31]
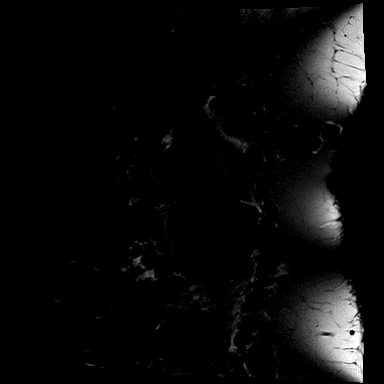
[im 31/31]
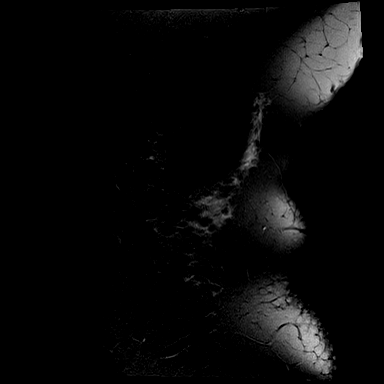

[Series 9: PD fat-sat · sagittal · left · 3.2mm · 0.56mm/px · 6 of 30 slices shown (2 of 2)]
[im 1/30]
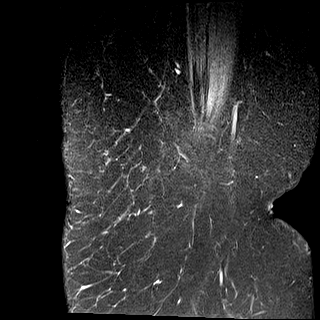
[im 6/30]
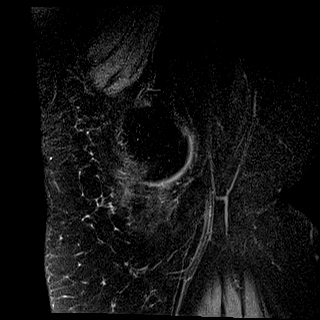
[im 12/30]
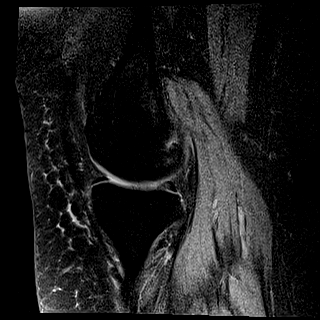
[im 18/30]
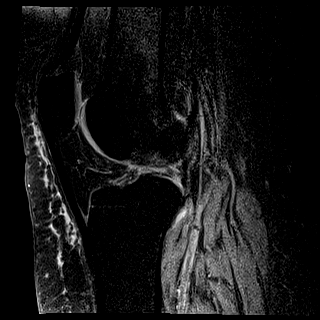
[im 24/30]
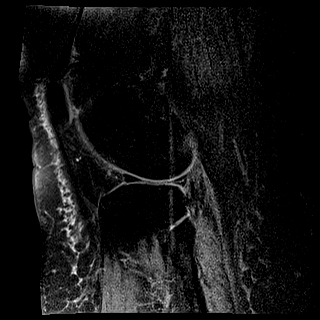
[im 30/30]
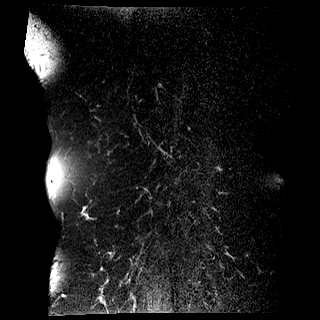

[Series 10: T2 fat-sat · sagittal · left · 3.0mm · 0.47mm/px · 6 of 30 slices shown (3 of 3)]
[im 1/30]
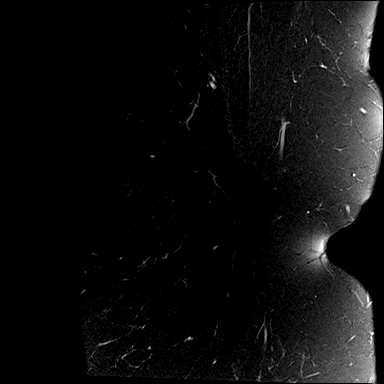
[im 6/30]
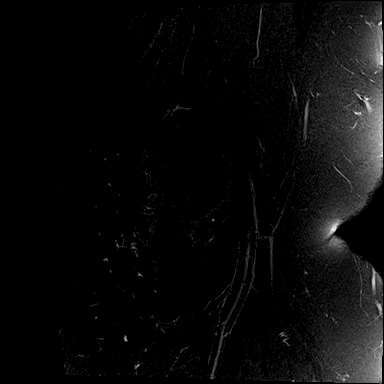
[im 12/30]
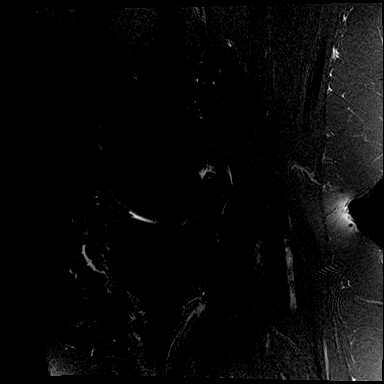
[im 18/30]
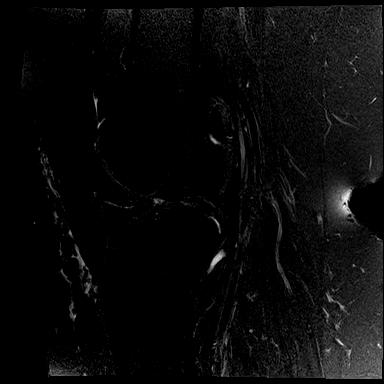
[im 24/30]
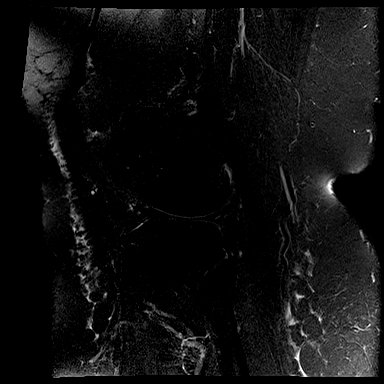
[im 30/30]
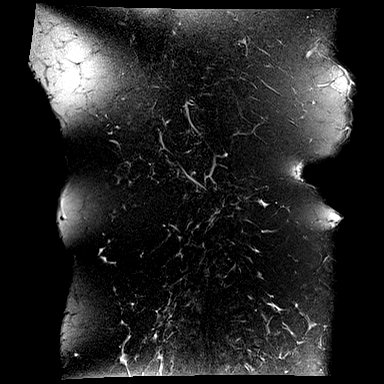

[38 of 40 positions shown; findings below may reference images not displayed]

FINDINGS: MENISCI

Medial: Intrasubstance degenerative signal without definitive tear.

Lateral: There is a nondisplaced horizontal tear of the posterior
horn and body of the lateral meniscus. Undersurface fraying of the
posterior root.

LIGAMENTS

Cruciates: ACL and PCL are intact.

Collaterals: Medial collateral ligament is intact. Lateral
collateral ligament complex is intact.

CARTILAGE

Patellofemoral:  No chondral defect.

Medial:  No chondral defect.

Lateral:  Mild chondrosis.

JOINT: Small joint effusion.

POPLITEAL FOSSA: No significant Baker cyst.

EXTENSOR MECHANISM: Intact quadriceps tendon. Intact patellar
tendon.

BONES: No aggressive osseous lesion. No fracture or dislocation.

Other: No fluid collection or hematoma. Muscles are normal.
IMPRESSION: Nondisplaced horizontal tear of the posterior horn and body of the
lateral meniscus. Undersurface fraying of the posterior root.

No evidence of ligamentous injury.

## 2021-01-08 IMAGING — MR MR KNEE*R* W/O CM
6 series · 40 of 40 positions shown · non-contrast
Comparison: Right knee radiograph [DATE]

CLINICAL DATA: Right knee pain

EXAM:
MRI OF THE RIGHT KNEE WITHOUT CONTRAST
TECHNIQUE: Multiplanar, multisequence MR imaging of the knee was performed. No
intravenous contrast was administered.

[Series 4: T2 fat-sat · axial · right · 4.0mm · 0.56mm/px · z∈[-24,+113]mm · 9 of 32 slices shown (1 of 3)]
[im 1/32]
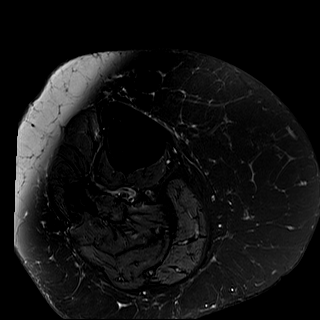
[im 4/32]
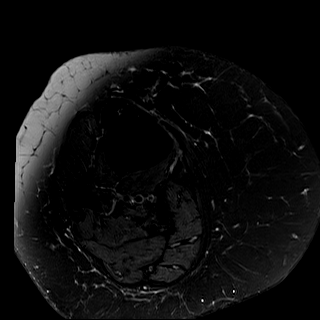
[im 8/32]
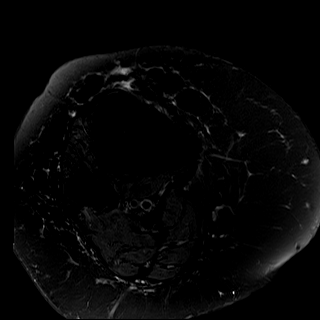
[im 12/32]
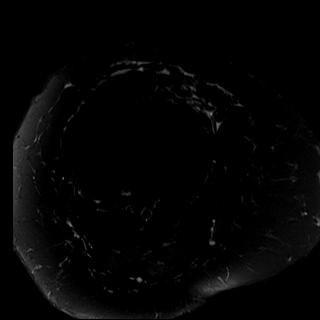
[im 16/32]
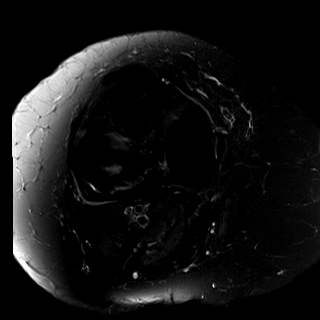
[im 20/32]
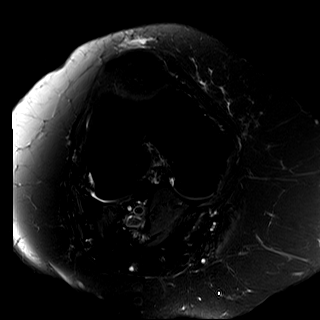
[im 24/32]
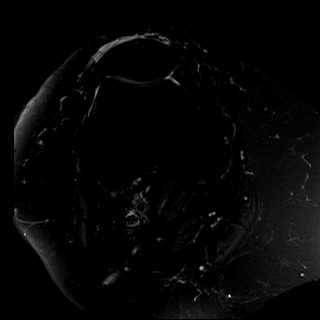
[im 28/32]
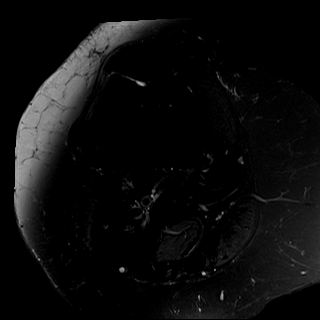
[im 32/32]
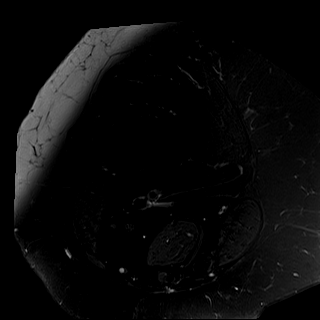

[Series 5: T2 fat-sat · coronal · right · 4.0mm · 0.50mm/px · 6 of 26 slices shown (2 of 3)]
[im 1/26]
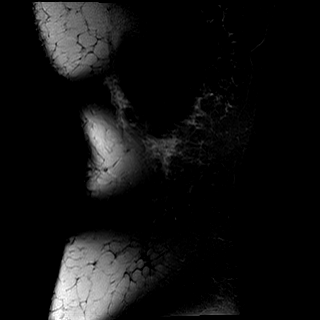
[im 6/26]
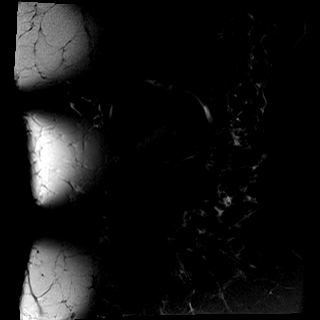
[im 11/26]
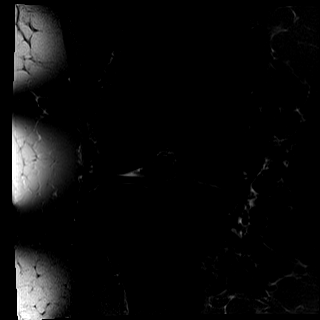
[im 16/26]
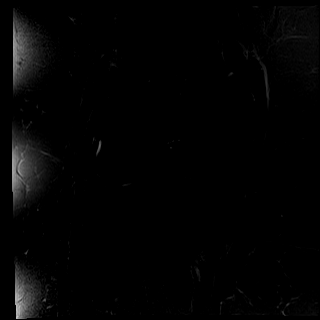
[im 21/26]
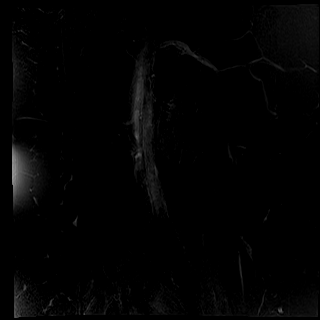
[im 26/26]
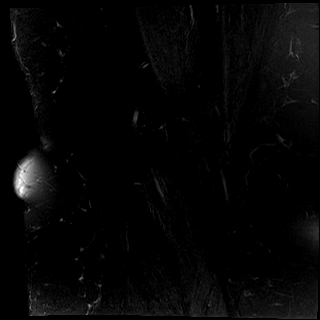

[Series 6: T1 · coronal · right · 4.0mm · 0.50mm/px · 6 of 26 slices shown]
[im 1/26]
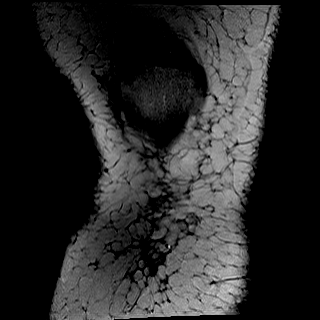
[im 6/26]
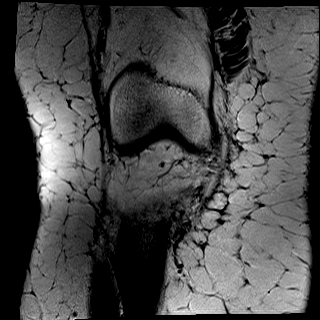
[im 11/26]
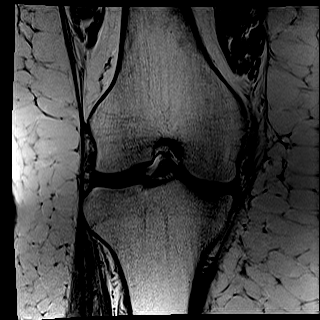
[im 16/26]
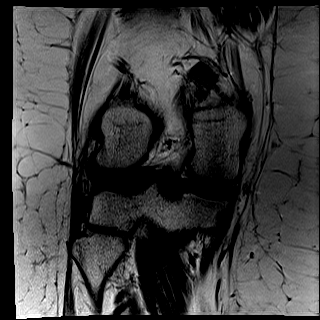
[im 21/26]
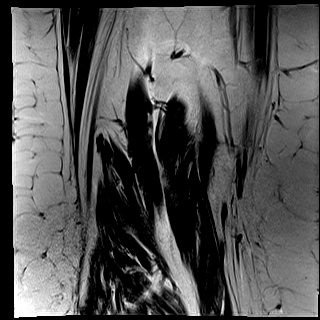
[im 26/26]
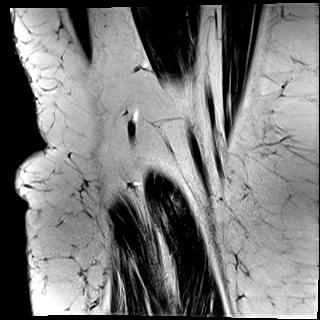

[Series 7: PD fat-sat · coronal · right · 3.3mm · 0.62mm/px · 7 of 29 slices shown (1 of 2)]
[im 1/29]
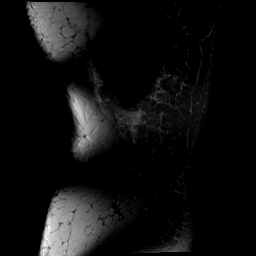
[im 5/29]
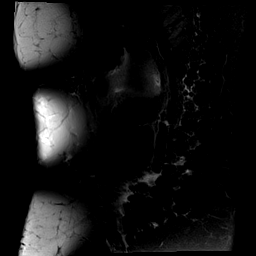
[im 10/29]
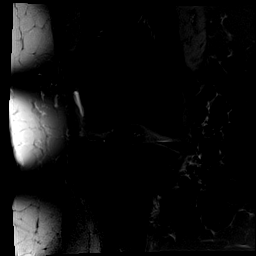
[im 15/29]
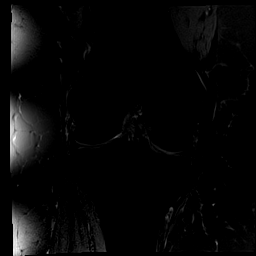
[im 19/29]
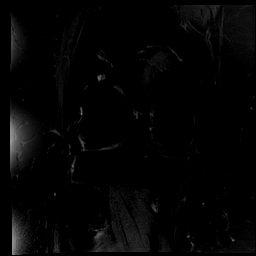
[im 24/29]
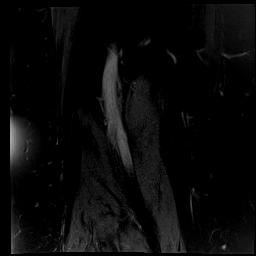
[im 29/29]
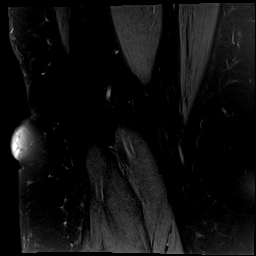

[Series 8: PD fat-sat · sagittal · right · 3.3mm · 0.50mm/px · 6 of 25 slices shown (2 of 2)]
[im 1/25]
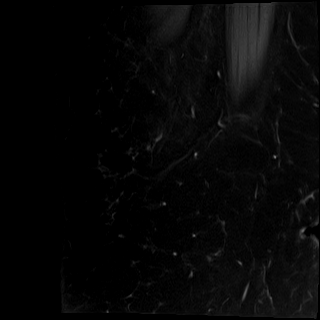
[im 5/25]
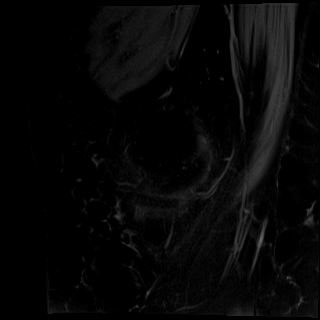
[im 10/25]
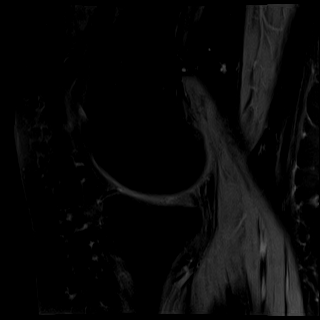
[im 15/25]
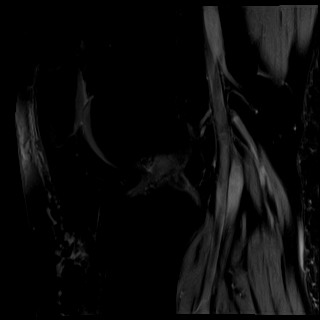
[im 20/25]
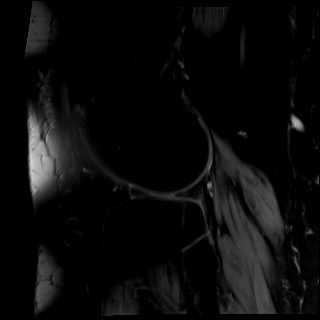
[im 25/25]
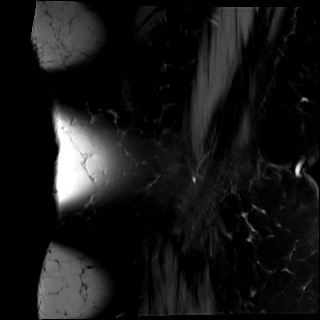

[Series 9: T2 fat-sat · sagittal · right · 3.3mm · 0.50mm/px · 6 of 25 slices shown (3 of 3)]
[im 1/25]
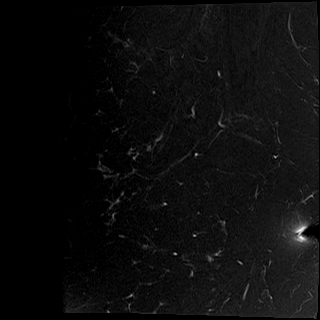
[im 5/25]
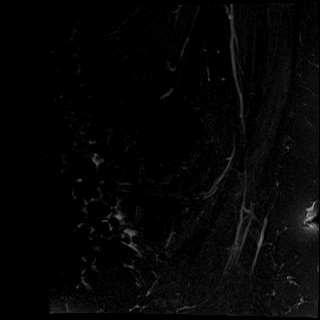
[im 10/25]
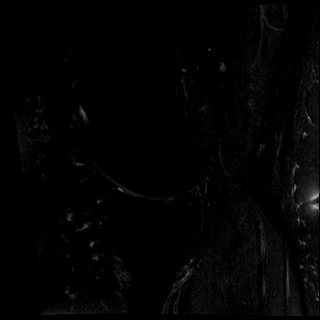
[im 15/25]
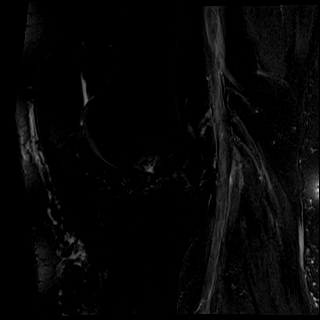
[im 20/25]
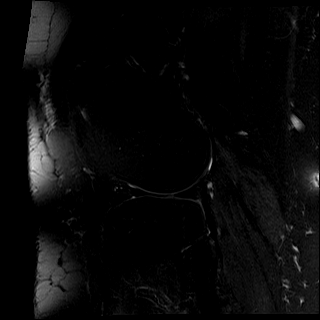
[im 25/25]
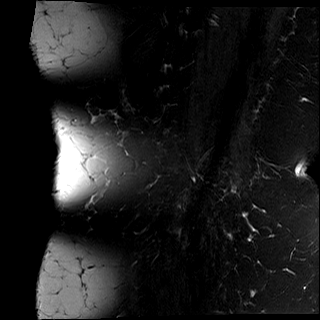

[40 of 40 positions shown; findings below may reference images not displayed]

FINDINGS: MENISCI

Medial: Intrasubstance degeneration without definitive meniscus
tear.

Lateral: Mild intrasubstance degenerative signal without definitive
tear.

LIGAMENTS

Cruciates: ACL and PCL are intact.

Collaterals: Medial collateral ligament is intact. Lateral
collateral ligament complex is intact.

CARTILAGE

Patellofemoral:  No chondral defect.

Medial:  No chondral defect.

Lateral:  Mild chondrosis.

JOINT: No significant joint effusion.

POPLITEAL FOSSA: No Baker's cyst.

EXTENSOR MECHANISM: Intact quadriceps tendon. Intact patellar
tendon.

BONES: No aggressive osseous lesion. No fracture or dislocation.

Other: No fluid collection or hematoma. Muscles are normal.
IMPRESSION: No evidence of meniscus tear or ligamentous injury.

Mild lateral compartment chondrosis.

## 2021-01-12 ENCOUNTER — Ambulatory Visit (INDEPENDENT_AMBULATORY_CARE_PROVIDER_SITE_OTHER): Payer: Medicare Other | Admitting: Licensed Clinical Social Worker

## 2021-01-12 DIAGNOSIS — F411 Generalized anxiety disorder: Secondary | ICD-10-CM

## 2021-01-12 DIAGNOSIS — F4312 Post-traumatic stress disorder, chronic: Secondary | ICD-10-CM

## 2021-01-12 DIAGNOSIS — F410 Panic disorder [episodic paroxysmal anxiety] without agoraphobia: Secondary | ICD-10-CM

## 2021-01-12 DIAGNOSIS — F331 Major depressive disorder, recurrent, moderate: Secondary | ICD-10-CM

## 2021-01-12 NOTE — Progress Notes (Signed)
Virtual Visit via Telephone Note  I connected with Lindsay [name] on 01/12/21 at  8:00 AM EST by telephone and verified that I am speaking with the correct person using two identifiers.  Location: Patient: home Provider: home office   I discussed the limitations, risks, security and privacy concerns of performing an evaluation and management service by telephone and the availability of in person appointments. I also discussed with the patient that there may be a patient responsible charge related to this service. The patient expressed understanding and agreed to proceed.   I discussed the assessment and treatment plan with the patient. The patient was provided an opportunity to ask questions and all were answered. The patient agreed with the plan and demonstrated an understanding of the instructions.   The patient was advised to call back or seek an in-person evaluation if the symptoms worsen or if the condition fails to improve as anticipated.  I provided 25 minutes of non-face-to-face time during this encounter.  THERAPIST PROGRESS NOTE  Session Time: 8:00 AM to 8:25 AM  Participation Level: Active  Behavioral Response: CasualAlertDepressed irritable  Type of Therapy: Individual Therapy  Treatment Goals addressed:  Patient continues to find therapy helpful for supportive and strength-based interventions as well as continue to focus help cope with health issues, anxiety, triggers, coping, utilize therapy as a way for patient to focus on working through current stressors that also helps to distract from pain Interventions: Solution Focused, Strength-based, Supportive, and Other: coping  Summary: Lindsay [name] is a 50 y.o. female who presents with "here and about the same". Therapist brought up a topic and about things that cheer [name] up small things and therapist talked about watching her birdhouse. Patient shared enjoying this and said birds are nature at its finest. Petra Kuba definitely  something that cheers [name] up. Has a window and keeps blinds up. Doesn't feel like doing things when doesn't feel well. One of the main issues is the pain issues. Had a MRI for knees. Patient reading her results one is a tear front and back. Second one is swelling around knee area and third fluid. When look it up treatment is surgery and physical therapy. If can't move around how can get physical therapy. 20th pain management appointment where they will talk about results. They are going to try downplay it patient says. Not sure what she is going to do. Hasn't talked to the doctor yet. Like lower back not doing anything about. Gets up to go to bathroom. Makes her not want to take fluid pill has to take. Rarely will go to kitchen heat something up. Mom will help her eat but lately she has been hurting. Aid comes but not washing her hands so doesn't want her making her food. Daughter is still at the hospital with her boyfriend. Monday will be 6 weeks that he has been in the hospital. Sister is there sometimes, has her daughter and good company. Don't get any rest didn't sleep because knew needed to stay up for appointment. Therapist pointed out talking to patient regular has accurate understanding of severity of pain and how it prevents her from functioning in bed all day perhaps has a better picture than some doctors, assess prep despite helpful for patient to have somebody who checks in and understand what is going on with her.   Therapist reviewed symptoms, facilitated expression of thoughts and feelings.  Note sessions are shorter because patient is in pain limited ability to have conversation.  Therapist continues  to empathize with patient and validate patient on frustrations of situation not changing and not feeling she is getting the help she needs.  Therapist pointing out condition of patient struggles as she gets an more accurate picture from checking in more often with patient saying her continuingly  reported pain and in mobility.  Note she is going to see pain specialist on 20th so again remain hopeful that we will address some of patient's issues.  Patient did read her MRI report and does seem will be issues to address.  Reviewed limited ability to celebrate holidays therapist could empathize with this as well her lack of plans.  Brought up small things that can make is happy during the day to find areas that would be interventions to help with mood therapist shared her interest in joy from her bird house patient could relate with nature being something that she can experience is positive and went on to say birds or nature at finest.  Discussed different types in the beauty of these types.  Therapist mention fortunate that there is nothing we have to do to experience this type of beauty.  Assess continue helpful for therapist to check in for supportive intervention, keep up-to-date with what is going on with patient so can provide interventions that help patient as she struggles with her medical issues at the same time validating her frustrations and getting these issues resolved.  Therapist provided active listening, open questions, supportive interventions. Suicidal/Homicidal: No  Plan: Return again in 1 week.2.Continue to provide strength-based and supportive interventions as patient is struggling with a lot of medical issues continuing her to encourage her to speak up for what she needs, coping, stress management  Diagnosis: Axis I: Major depressive disorder, recurrent, moderate, generalized anxiety disorder, chronic PTSD, panic attacks    Axis II: No diagnosis    Lindsay Register, LCSW 01/12/2021

## 2021-01-17 ENCOUNTER — Ambulatory Visit: Payer: Medicare Other | Admitting: Physician Assistant

## 2021-01-18 ENCOUNTER — Ambulatory Visit (INDEPENDENT_AMBULATORY_CARE_PROVIDER_SITE_OTHER): Payer: Medicare Other | Admitting: Licensed Clinical Social Worker

## 2021-01-18 DIAGNOSIS — F331 Major depressive disorder, recurrent, moderate: Secondary | ICD-10-CM | POA: Diagnosis not present

## 2021-01-18 DIAGNOSIS — F4312 Post-traumatic stress disorder, chronic: Secondary | ICD-10-CM

## 2021-01-18 DIAGNOSIS — F410 Panic disorder [episodic paroxysmal anxiety] without agoraphobia: Secondary | ICD-10-CM

## 2021-01-18 DIAGNOSIS — F411 Generalized anxiety disorder: Secondary | ICD-10-CM

## 2021-01-18 NOTE — Progress Notes (Signed)
Virtual Visit via Telephone Note  I connected with Lindsay [name] on 01/18/21 at  8:00 AM EST by telephone and verified that I am speaking with the correct person using two identifiers.  Location: Patient: home Provider: home office   I discussed the limitations, risks, security and privacy concerns of performing an evaluation and management service by telephone and the availability of in person appointments. I also discussed with the patient that there may be a patient responsible charge related to this service. The patient expressed understanding and agreed to proceed.  I discussed the assessment and treatment plan with the patient. The patient was provided an opportunity to ask questions and all were answered. The patient agreed with the plan and demonstrated an understanding of the instructions.   The patient was advised to call back or seek an in-person evaluation if the symptoms worsen or if the condition fails to improve as anticipated.  I provided 48 minutes of non-face-to-face time during this encounter.  THERAPIST PROGRESS NOTE  Session Time: 8:00 AM to 8:48 AM  Participation Level: Active  Behavioral Response: CasualAlertappropriate still shares frustrations about things such way her doctors for physical issue approach her care  Type of Therapy: Individual Therapy  Treatment Goals addressed:  Patient continues to find therapy helpful for supportive and strength-based interventions as well as continue to focus help cope with health issues, anxiety, triggers, coping, utilize therapy as a way for patient to focus on working through current stressors that also helps to distract from pain Interventions: Solution Focused, Strength-based, Supportive, and Other: coping  Summary: Lindsay [name] is a 50 y.o. female who presents with results from going to pain management clinic. She has a tear in meniscus. Doctor said knew something and patient said he treated her bad and downplayed what  was going on. Explained to therapist that it is torn ligament in knee. Has to see another doctor there as her doctor is leaving. Has dealt with him before and he was rude. Doctor said only treat with medicines because patient probably not a good candidate with her medical issues for surgery. Can't afford a type of Vitamin B they prescribed insurance won't cover and increased dosage of pain patch. They are going to research other medicines patient doesn't want to change anything because doesn't want to change psych meds for panic. Doctor already lowered dosage. After patches will try something else. Dr. Vira Blanco new doctor. Hopeful get some relief still doesn't feel doctors  listening. PCP was supposed do an MRI didn't get done for over a year. Doesn't feel any better. Still have to order 15 micrograms for pain patch though will get today. Lidocaine patch put on bottom of foot didn't put on would come off put on shoulder from arthritis. Can put that in different places and likes that. Regular pain patch wear a week put in different places. Wishes patch would take edge off pain. Therapist explored whether felt better that they finally found something did feel that they finally listened. If they could fix the pain issue all she wants doesn't want to take medication if don't have to. Not eager to take more medications in system.  Reviewed other things they found including osteoarthritis, frayed ligaments around the joints, and the neuropathy, liquid around the joint.  Session to talking about celebrating the holiday and she is going to try to cook the ham on Saturday so don't have to worry about it on Monday. Standard roast.  Therapist went back to talking about hoping  for relief for patient would be able to enjoy her holiday more and patient describes again how it feels aches so bad in front and back like something on fire have told the doctors.       Therapist reviewed symptoms, facilitated expression of thoughts and  feeling to process through recent visit to Dr. Who finally found some of the reasons for patient's pain.  Therapist shared her perspective glad they finally found something as we have had lots of sessions where nobody is doing anything this is hopeful for patient.  Assess patient also is glad that they found something and will start to treat her for it.  Noted this will be ongoing as they will continue to look for ways to treat her with medications that we will help.  Validated patient on her feelings of the difficulties of dealing with some doctors their insensitivity sometimes there are lack of effort so you do not get the help you need.  Continue to provide supportive interventions as well as open questions active listening. Suicidal/Homicidal: No  Plan: Return again in 2 weeks.2..Continue to provide strength-based and supportive interventions as patient is struggling with a lot of medical issues continuing her to encourage her to speak up for what she needs, coping, stress management  Diagnosis: Axis I: Major depressive disorder, recurrent, moderate, generalized anxiety disorder, chronic PTSD, panic attacks    Axis II: No diagnosis    [name], LCSW 01/18/2021

## 2021-01-24 ENCOUNTER — Other Ambulatory Visit: Payer: Self-pay | Admitting: Gastroenterology

## 2021-01-26 ENCOUNTER — Other Ambulatory Visit: Payer: Self-pay | Admitting: Pulmonary Disease

## 2021-01-27 ENCOUNTER — Other Ambulatory Visit: Payer: Self-pay | Admitting: Internal Medicine

## 2021-01-27 DIAGNOSIS — E1121 Type 2 diabetes mellitus with diabetic nephropathy: Secondary | ICD-10-CM

## 2021-02-02 ENCOUNTER — Ambulatory Visit (INDEPENDENT_AMBULATORY_CARE_PROVIDER_SITE_OTHER): Payer: Medicare Other | Admitting: Licensed Clinical Social Worker

## 2021-02-02 ENCOUNTER — Other Ambulatory Visit: Payer: Self-pay | Admitting: Internal Medicine

## 2021-02-02 DIAGNOSIS — F331 Major depressive disorder, recurrent, moderate: Secondary | ICD-10-CM | POA: Diagnosis not present

## 2021-02-02 DIAGNOSIS — F410 Panic disorder [episodic paroxysmal anxiety] without agoraphobia: Secondary | ICD-10-CM | POA: Diagnosis not present

## 2021-02-02 DIAGNOSIS — F411 Generalized anxiety disorder: Secondary | ICD-10-CM | POA: Diagnosis not present

## 2021-02-02 DIAGNOSIS — E119 Type 2 diabetes mellitus without complications: Secondary | ICD-10-CM

## 2021-02-02 DIAGNOSIS — F4312 Post-traumatic stress disorder, chronic: Secondary | ICD-10-CM | POA: Diagnosis not present

## 2021-02-02 NOTE — Progress Notes (Signed)
Virtual Visit via Telephone Note  I connected with [name] on 02/02/21 at  8:00 AM EST by telephone and verified that I am speaking with the correct person using two identifiers.  Location: Patient: home Provider: home office   I discussed the limitations, risks, security and privacy concerns of performing an evaluation and management service by telephone and the availability of in person appointments. I also discussed with the patient that there may be a patient responsible charge related to this service. The patient expressed understanding and agreed to proceed.   I discussed the assessment and treatment plan with the patient. The patient was provided an opportunity to ask questions and all were answered. The patient agreed with the plan and demonstrated an understanding of the instructions.   The patient was advised to call back or seek an in-person evaluation if the symptoms worsen or if the condition fails to improve as anticipated.  I provided 16 minutes of non-face-to-face time during this encounter.  THERAPIST PROGRESS NOTE  Session Time: 8:00 AM to 8:16 AM  Participation Level: Active  Behavioral Response: CasualAlertin pain so struggling with pain issues  Type of Therapy: Individual Therapy  Treatment Goals addressed:  Patient continues to find therapy helpful for supportive and strength-based interventions as well as continue to focus help cope with health issues, anxiety, triggers, coping, utilize therapy as a way for patient to focus on working through current stressors that also helps to distract from pain Interventions: Solution Focused, Strength-based, and Supportive  Summary: Lindsay [name] is a 51 y.o. female who presents with holidays were like a regular day. Patient is a lot of pain like it is worse. Her knee worse and pain goes up to thigh where it connects to body. It feels like on fire when it goes on thigh like that. Pain intense in the knee in the front and  the back. Even though right knee popping, left knee bothering her more. Can feel more pain in right knee. In bed all the time. Appointment with pain management on 1/17. So far booked out have to keep appointment have. On Monday make a lot of phone calls set appointments for foot care, mammogram, PCP, vestibular. Can't do anything with leg like this barely go the bathroom been telling people that can barely stand up. Mom and daughter there for her. Therapist talked about mindfulness for pain used as an intervention. Patient response was that  physical therapy made it hurt worse.  Therapist explained a little more about what mindfulness was    Therapist checked in with patient and no pain issues are worse, kept session sort due to this.  Reviewed what she did for holidays, her functioning which is limited to the point where she is in bed most of the time and plan of care sooner she can get appointment with pain management is on the 17th.  Reviewed her supports as she goes through this difficult time that can help her which are her mom and her daughter.  Therapist processed feelings to help cope with with her feelings and stressors.  Therapist offered strength-based and supportive intervention. Suicidal/Homicidal: No  Plan: Return again in 2 weeks.2.Continue to provide strength-based and supportive interventions as patient is struggling with a lot of medical issues continuing her to encourage her to speak up for what she needs, coping, stress management  Diagnosis: Axis I: Major depressive disorder, recurrent, moderate, generalized anxiety disorder, chronic PTSD, panic attacks    Axis II: No diagnosis  [name], LCSW 02/02/2021

## 2021-02-07 ENCOUNTER — Other Ambulatory Visit: Payer: Self-pay | Admitting: Internal Medicine

## 2021-02-07 DIAGNOSIS — E119 Type 2 diabetes mellitus without complications: Secondary | ICD-10-CM

## 2021-02-16 ENCOUNTER — Ambulatory Visit (INDEPENDENT_AMBULATORY_CARE_PROVIDER_SITE_OTHER): Payer: Medicare Other | Admitting: Licensed Clinical Social Worker

## 2021-02-16 DIAGNOSIS — F331 Major depressive disorder, recurrent, moderate: Secondary | ICD-10-CM

## 2021-02-16 DIAGNOSIS — F410 Panic disorder [episodic paroxysmal anxiety] without agoraphobia: Secondary | ICD-10-CM

## 2021-02-16 DIAGNOSIS — F411 Generalized anxiety disorder: Secondary | ICD-10-CM

## 2021-02-16 DIAGNOSIS — F4312 Post-traumatic stress disorder, chronic: Secondary | ICD-10-CM

## 2021-02-16 NOTE — Progress Notes (Signed)
Virtual Visit via Telephone Note  I connected with Lindsay [name] on 02/16/21 at  8:00 AM EST by telephone and verified that I am speaking with the correct person using two identifiers.  Location: Patient: home Provider: home office   I discussed the limitations, risks, security and privacy concerns of performing an evaluation and management service by telephone and the availability of in person appointments. I also discussed with the patient that there may be a patient responsible charge related to this service. The patient expressed understanding and agreed to proceed.   I discussed the assessment and treatment plan with the patient. The patient was provided an opportunity to ask questions and all were answered. The patient agreed with the plan and demonstrated an understanding of the instructions.   The patient was advised to call back or seek an in-person evaluation if the symptoms worsen or if the condition fails to improve as anticipated.  I provided 30 minutes of non-face-to-face time during this encounter.  THERAPIST PROGRESS NOTE  Session Time: 8:00 AM to 8:30 AM  Participation Level: Active  Behavioral Response: CasualAlertAnxious, Dysphoric, and Irritable  Type of Therapy: Individual Therapy  Treatment Goals addressed:  Patient continues to find therapy helpful for supportive and strength-based interventions as well as continue to focus help cope with health issues, anxiety, triggers, coping, utilize therapy as a way for patient to focus on working through current stressors that also helps to distract from pain Interventions: Solution Focused, Strength-based, Supportive, and Other: coping  Summary: Lindsay [name] is a 51 y.o. female who presents with really stressed out that forgot to mention last session that Social Security told her that owe $2700 in November, now in January owe $26,000. Has been so stressed hasn't wanted to deal with it but going to ask for an appeal and a  waiver form, they haven't told her anything except it is for the dates from 2018-2020, for  her daughter and patient is her representee payee. In pain.  Therapist was hopeful for good news but patient says does not have any to share did go to pain management appointment they focus on the weight, weight loss surgery, also want to do a procedure to look at knee.  Reviewed how her pain medications are helping she says 20 micrograms still not doing anything. They are focusing on the weight and patient telling the doctor that this was going on before weight gain. She is telling him that whole body hurts. He wants to focus on one area at a time, but patient is trying to explain that the whole body aching. They aren't  paying attention "could be knocking death's door" and they wouldn't be paying attention. She feels like they are brushing her off and not paying attention. What they tell her she has been dealing with her whole life. Focusing on the weight part even at [age] swollen ankles and feet and then said it was her weight. In third grade. Point where want to give up.. Therapist explored how to manage a system not helping her.and owing $26,000. Thinking about it causes panic Reviewed dealing with pain really bothering her and really hurts. Her provider wants to focus in one area while patient saying that whole body hurts. People think wants a pain pill and doesn't want that just don't want to be in pain. Know the risk with other medications. Still has to make appointments for different things.  Reviewed session and therapist feeling as she is not helping patient enough patients is  helpful to talk about get supportive interventions.  Therapist reviewed symptoms, facilitated expression of thoughts and feelings utilizing this as important treatment intervention as patient continues to struggle with symptoms and systems that are not responsive to her in fact creating more stress.  Therapist validated patient how she  was feeling, recognizing in therapist own experience the power differential that makes it hard when things are not fair and having to advocate for oneself.  Therapist emphasized importance of not giving up sharing in her own experiences when things have not worked there is always another way around it to reach her goals, that the persistence is effective in encouraging patient with this.  Also sharing therapist not giving up for patient's issues and knowing at some point things will be better and to keep pushing for what she needs.  Eventually she continues to push there will be a breakthrough.  Therapist presented her perspective not fair that a system like Social Security would approve benefits and then be able to change their mind about that recognizing people receiving benefits or not in a position the able to pay that back with her using that for basic needs..  In terms of working with doctors work within their system but continue to present how she feels that they are not listening to what she needs.  Assess therapist support helpful for patient in recognizing her needs are valid, that she feels listens to and supporting patient in standing up for what she needs and pointing out where does not feel needs are being heard or that systems are not treating her fairly.  Therapist provided active listening open questions supportive interventions..   Suicidal/Homicidal: No  Plan: Return again in 2 weeks.2Continue to provide strength-based and supportive interventions as patient is struggling with a lot of medical issues continuing her to encourage her to speak up for what she needs, coping, stress management  Diagnosis: Axis I: Major depressive disorder, recurrent, moderate, generalized anxiety disorder, chronic PTSD, panic attacks    Axis II: No diagnosis    [name], LCSW 02/16/2021

## 2021-02-17 ENCOUNTER — Other Ambulatory Visit: Payer: Self-pay | Admitting: Pulmonary Disease

## 2021-03-01 ENCOUNTER — Other Ambulatory Visit: Payer: Self-pay | Admitting: Internal Medicine

## 2021-03-01 DIAGNOSIS — R42 Dizziness and giddiness: Secondary | ICD-10-CM

## 2021-03-02 ENCOUNTER — Other Ambulatory Visit: Payer: Self-pay | Admitting: Internal Medicine

## 2021-03-02 ENCOUNTER — Ambulatory Visit (INDEPENDENT_AMBULATORY_CARE_PROVIDER_SITE_OTHER): Payer: Medicare Other | Admitting: Licensed Clinical Social Worker

## 2021-03-02 DIAGNOSIS — F331 Major depressive disorder, recurrent, moderate: Secondary | ICD-10-CM | POA: Diagnosis not present

## 2021-03-02 DIAGNOSIS — F410 Panic disorder [episodic paroxysmal anxiety] without agoraphobia: Secondary | ICD-10-CM

## 2021-03-02 DIAGNOSIS — F411 Generalized anxiety disorder: Secondary | ICD-10-CM

## 2021-03-02 DIAGNOSIS — E559 Vitamin D deficiency, unspecified: Secondary | ICD-10-CM

## 2021-03-02 DIAGNOSIS — F4312 Post-traumatic stress disorder, chronic: Secondary | ICD-10-CM

## 2021-03-02 NOTE — Progress Notes (Signed)
°  Virtual Visit via Telephone Note  I connected with Lindsay [name] on 03/02/21 at  8:00 AM EST by telephone and verified that I am speaking with the correct person using two identifiers.  Location: Patient: home Provider: home office   I discussed the limitations, risks, security and privacy concerns of performing an evaluation and management service by telephone and the availability of in person appointments. I also discussed with the patient that there may be a patient responsible charge related to this service. The patient expressed understanding and agreed to proceed.   I discussed the assessment and treatment plan with the patient. The patient was provided an opportunity to ask questions and all were answered. The patient agreed with the plan and demonstrated an understanding of the instructions.   The patient was advised to call back or seek an in-person evaluation if the symptoms worsen or if the condition fails to improve as anticipated.  I provided 30 minutes of non-face-to-face time during this encounter.  THERAPIST PROGRESS NOTE  Session Time: 8:00 AM to 8:30 AM  Participation Level: Active  Behavioral Response: CasualAlertAnxious and Dysphoric  Type of Therapy: Individual Therapy  Treatment Goals addressed:  Patient continues to find therapy helpful for supportive and strength-based interventions as well as continue to focus help cope with health issues, anxiety, triggers, coping, utilize therapy as a way for patient to focus on working through current stressors that also helps to distract from pain Interventions: Solution Focused, Strength-based, Supportive, Reframing, and Other: coping  Summary: Lindsay [name] is a 51 y.o. female who presents with sister said to admit to therapist have no self control. Left popcorn in room and patient ate it. As we explored this patient said she is joking around. Owe social security has nightmares, weird dreams. Worry thoughts that are  intense. Feels body wasting away and dying. Not going to waste time to go an appointment where they don't do anything and all negative. Still in a lot of pain. In bed all the time. Whole body is hurting. Appointment at pain clinic on 15th. Therapist pointed out impression she has gotten limited help when we check in so can understand why she feels that way.  Patient fell asleep during session therapist called her back and she relates has not slept in 2 nights so ended session early so she could get some sleep.  Therapist reviewed symptoms facilitated expression of thoughts and feelings therapist provided support and space to patient as she shared her thoughts and feelings in session.  Processed feelings to help her cope with major stressors validated patient on how she was feeling dealing with the significant stressors.  Noted patient negative thoughts about her medical issues therapist reframed to point out she understands given limited amount of progress made with medical care.  Indications of stressors included nightmares weird dreams therapist pointing out when this happens usually is because her significant stressors.  Reports assesses helpful for patient to have supportive interventions given significance of issues she is dealing with.  Therapist provided active listening open questions supportive interventions.  Suicidal/Homicidal: No  Plan: Return again in 2 weeks. 2.Continue to provide strength-based and supportive interventions as patient is struggling with a lot of medical issues continuing her to encourage her to speak up for what she needs, coping, stress management  Diagnosis: Axis I: Major depressive disorder, recurrent, moderate, generalized anxiety disorder, chronic PTSD, panic attacks    Axis II: No diagnosis    [name], LCSW 03/02/2021

## 2021-03-04 ENCOUNTER — Other Ambulatory Visit (HOSPITAL_COMMUNITY): Payer: Self-pay | Admitting: Psychiatry

## 2021-03-04 DIAGNOSIS — F331 Major depressive disorder, recurrent, moderate: Secondary | ICD-10-CM

## 2021-03-09 ENCOUNTER — Other Ambulatory Visit: Payer: Self-pay

## 2021-03-09 ENCOUNTER — Encounter (HOSPITAL_COMMUNITY): Payer: Self-pay | Admitting: Psychiatry

## 2021-03-09 ENCOUNTER — Telehealth (HOSPITAL_BASED_OUTPATIENT_CLINIC_OR_DEPARTMENT_OTHER): Payer: Medicare Other | Admitting: Psychiatry

## 2021-03-09 ENCOUNTER — Ambulatory Visit (HOSPITAL_COMMUNITY): Payer: Self-pay | Admitting: Licensed Clinical Social Worker

## 2021-03-09 DIAGNOSIS — F4312 Post-traumatic stress disorder, chronic: Secondary | ICD-10-CM | POA: Diagnosis not present

## 2021-03-09 DIAGNOSIS — F331 Major depressive disorder, recurrent, moderate: Secondary | ICD-10-CM

## 2021-03-09 DIAGNOSIS — F410 Panic disorder [episodic paroxysmal anxiety] without agoraphobia: Secondary | ICD-10-CM | POA: Diagnosis not present

## 2021-03-09 MED ORDER — REXULTI 3 MG PO TABS: 3.0000 mg | ORAL_TABLET | Freq: Every day | ORAL | 0 refills | Status: DC

## 2021-03-09 MED ORDER — LAMOTRIGINE 150 MG PO TABS: 150.0000 mg | ORAL_TABLET | Freq: Every day | ORAL | 0 refills | Status: DC

## 2021-03-09 MED ORDER — CLORAZEPATE DIPOTASSIUM 3.75 MG PO TABS: 3.7500 mg | ORAL_TABLET | Freq: Every day | ORAL | 2 refills | Status: DC

## 2021-03-09 MED ORDER — MIRTAZAPINE 30 MG PO TABS: 30.0000 mg | ORAL_TABLET | Freq: Every day | ORAL | 0 refills | Status: DC
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
## 2021-03-16 ENCOUNTER — Ambulatory Visit (INDEPENDENT_AMBULATORY_CARE_PROVIDER_SITE_OTHER): Payer: Medicare Other | Admitting: Licensed Clinical Social Worker

## 2021-03-16 DIAGNOSIS — F410 Panic disorder [episodic paroxysmal anxiety] without agoraphobia: Secondary | ICD-10-CM

## 2021-03-16 DIAGNOSIS — F411 Generalized anxiety disorder: Secondary | ICD-10-CM

## 2021-03-16 DIAGNOSIS — F4312 Post-traumatic stress disorder, chronic: Secondary | ICD-10-CM

## 2021-03-16 DIAGNOSIS — F331 Major depressive disorder, recurrent, moderate: Secondary | ICD-10-CM | POA: Diagnosis not present

## 2021-03-16 NOTE — Progress Notes (Signed)
Virtual Visit via Telephone Note  I connected with Lindsay [name] on 03/16/21 at  8:00 AM EST by telephone and verified that I am speaking with the correct person using two identifiers.  Location: Patient: home Provider: home office   I discussed the limitations, risks, security and privacy concerns of performing an evaluation and management service by telephone and the availability of in person appointments. I also discussed with the patient that there may be a patient responsible charge related to this service. The patient expressed understanding and agreed to proceed.  I discussed the assessment and treatment plan with the patient. The patient was provided an opportunity to ask questions and all were answered. The patient agreed with the plan and demonstrated an understanding of the instructions.   The patient was advised to call back or seek an in-person evaluation if the symptoms worsen or if the condition fails to improve as anticipated.  I provided 50 minutes of non-face-to-face time during this encounter.  THERAPIST PROGRESS NOTE  Session Time: 8:00 AM to 8:50 AM  Participation Level: Active  Behavioral Response: CasualAlertDepressed  Type of Therapy: Individual Therapy  Treatment Goals addressed:  Patient continues to find therapy helpful for supportive and strength-based interventions as well as continue to focus help cope with health issues, anxiety, triggers, coping, utilize therapy as a way for patient to focus on working through current stressors that also helps to distract from pain ProgressTowards Goals: Initial actively working on treatment goals including coping for health issues, managing current stressors  Interventions: Solution Focused, Strength-based, Supportive, and Other: coping  Summary: Lindsay [name] is a 51 y.o. female who presents with still the same. saw Dr. Adele [name] and his recommendation to walk and patient said she has torn meniscus in knee so can't walk.  Patient getting mom's medicine and says can't sit up long, that is how bad medical issues are. Wish could do stuff for herself. Knee worse on left the back of her knee area. Pain in legs and back. Shares Mom getting on nerves. Uncle bringing over mattress to make it taller makes things easier for her to get up and also address the issue of her legs touching the floor. Doesn't know what pain management is doing until March 15. People getting on mom's nerves patient's sister and daughter leaving things all over the house. Not really staying with them anymore. Patient in a lot of pain and patches don't work. Had they not waited three years pain patches may have work better. Found issues in x-ray and all done is increase pain patch. Hasn't had panic attacks or crying spells. Keep worrying about social security main issue. Owe them over $25,000.00 dollars. Can't get the office and meet with someone because of leg and back. Today will call social security need help with paperwork. Therapist said better to face things. Discussed activities and patient says tries to get as much sleep as can tosses and turns. Has company mom comes in, also daughter and caretaker. Therapist pointed out that it seems like such a long time where nobody is done something about the pain still the same. Patient says needs supports, supports such as doctors to reach out to them to tell them treatment is not working and needs help. Calls them and says no opening until March. Explored how long in pain with knee since 2017. Trying to get leg situated wants to cry maybe go to ER today to get a shot to take the edge off of pain. Really want  to go but has to find a way back.  Explored options caretaker takes her places and could take her but has to have a way back.  As we are talking patient not saying a lot and she said she is trying to tolerate the pain and not say to much. Therapist encouraged her to get the hospital and would be good to keep her a  couple days.  Patient agrees and says needs the edge taking off unbearable.  She does say looks forward to therapist call is helpful and a support therapist grateful for patient and therapist is helpful.  Therapist reviewed symptoms facilitated expression of thoughts and feelings note helpful for patient with significant issues of stressors and pain to have supportive interventions with therapy.  Validated patient on her frustrations with the lack of help she is getting for providers for pain noting therapist has been with her on this journey for a long time nothing has been done to help her with this.  Discussed going to the hospital today and therapist encouraged her that would be a good choice as patient says the pain is unbearable.  As she said also would take the edge off.  Continue to encourage patient with solutions that will be helpful for her issues.  Also provided positive feedback for patient calling Social Security noted its not fun to do with the issue but facing things as a better way of coping that way he can decrease the anxiety that is built up and start to do something to address the problem will help her move through the anxiety and stress.  Therapist provided active listening open questions supportive interventions Suicidal/Homicidal: No  Plan: Return again in 2 weeks. 2.Continue to provide strength-based and supportive interventions as patient is struggling with a lot of medical issues continuing her to encourage her to speak up for what she needs, coping, stress management  Diagnosis: Major depressive disorder, recurrent, moderate, generalized anxiety disorder, chronic PTSD, panic attacks  Collaboration of Care: Medication Management AEB Dr.[name]  Patient/Guardian was advised Release of Information must be obtained prior to any record release in order to collaborate their care with an outside provider. Patient/Guardian was advised if they have not already done so to contact the  registration department to sign all necessary forms in order for [name] to release information regarding their care.   Consent: Patient/Guardian gives verbal consent for treatment and assignment of benefits for services provided during this visit. Patient/Guardian expressed understanding and agreed to proceed.   [name], LCSW 03/16/2021

## 2021-03-19 ENCOUNTER — Other Ambulatory Visit: Payer: Self-pay | Admitting: Internal Medicine

## 2021-03-23 ENCOUNTER — Other Ambulatory Visit: Payer: Self-pay | Admitting: Internal Medicine

## 2021-03-23 DIAGNOSIS — I5023 Acute on chronic systolic (congestive) heart failure: Secondary | ICD-10-CM

## 2021-03-28 ENCOUNTER — Ambulatory Visit (INDEPENDENT_AMBULATORY_CARE_PROVIDER_SITE_OTHER): Payer: Medicare Other | Admitting: Licensed Clinical Social Worker

## 2021-03-28 DIAGNOSIS — F411 Generalized anxiety disorder: Secondary | ICD-10-CM

## 2021-03-28 DIAGNOSIS — F410 Panic disorder [episodic paroxysmal anxiety] without agoraphobia: Secondary | ICD-10-CM

## 2021-03-28 DIAGNOSIS — F4312 Post-traumatic stress disorder, chronic: Secondary | ICD-10-CM

## 2021-03-28 DIAGNOSIS — F331 Major depressive disorder, recurrent, moderate: Secondary | ICD-10-CM

## 2021-03-28 NOTE — Progress Notes (Signed)
Summary: Lindsay [name] is a 51 y.o. female who presents with in a lot of pain up four or five days tossing and turning pain worse in legs, knees and bend of her leg. Keep calling pain management to get in there. Go to PCP 3/8 want to know if can switch doctors for pain. Pain patches all over not helping.  The pain issues patient not in a place to talk about things did not know what she would talk about.  Will call if she feels like talking and get herself into schedule.  Noted next appointment will be after seeing PCP and therapist hopes one of the things she is waiting to hear somebody helping her with these issues.  Validated patient how she was feeling therapist provided active listening open questions supportive interventions.  Did not have full session due to patient's medical issues. ? ? ?

## 2021-03-30 ENCOUNTER — Other Ambulatory Visit: Payer: Self-pay | Admitting: Internal Medicine

## 2021-03-30 DIAGNOSIS — E1121 Type 2 diabetes mellitus with diabetic nephropathy: Secondary | ICD-10-CM

## 2021-03-30 MED ORDER — OZEMPIC (1 MG/DOSE) 4 MG/3ML ~~LOC~~ SOPN: PEN_INJECTOR | SUBCUTANEOUS | 1 refills | Status: DC

## 2021-03-30 NOTE — Telephone Encounter (Signed)
Approved Ozempic refill. Please have patient schedule follow up with me. Thanks.  ?

## 2021-04-04 ENCOUNTER — Other Ambulatory Visit: Payer: Self-pay | Admitting: Internal Medicine

## 2021-04-04 ENCOUNTER — Encounter: Payer: Medicare Other | Admitting: Internal Medicine

## 2021-04-04 DIAGNOSIS — K219 Gastro-esophageal reflux disease without esophagitis: Secondary | ICD-10-CM

## 2021-04-04 NOTE — Telephone Encounter (Signed)
Called pt to re-schedule her appt. Appt schedule 3/22 @ 0845 Am with Dr Philipp Ovens. ?

## 2021-04-04 NOTE — Telephone Encounter (Signed)
Patient no showed her appointment with me today. I have approved 30 days of her lipitor and omeprazole, please have her reschedule. I have openings on 3/22. Thank you.  ?

## 2021-04-06 ENCOUNTER — Ambulatory Visit (INDEPENDENT_AMBULATORY_CARE_PROVIDER_SITE_OTHER): Payer: Medicare Other | Admitting: Licensed Clinical Social Worker

## 2021-04-06 DIAGNOSIS — F411 Generalized anxiety disorder: Secondary | ICD-10-CM | POA: Diagnosis not present

## 2021-04-06 DIAGNOSIS — F331 Major depressive disorder, recurrent, moderate: Secondary | ICD-10-CM

## 2021-04-06 DIAGNOSIS — F4312 Post-traumatic stress disorder, chronic: Secondary | ICD-10-CM | POA: Diagnosis not present

## 2021-04-06 DIAGNOSIS — F410 Panic disorder [episodic paroxysmal anxiety] without agoraphobia: Secondary | ICD-10-CM | POA: Diagnosis not present

## 2021-04-06 NOTE — Progress Notes (Signed)
Virtual Visit via Telephone Note ? ?I connected with Lindsay [name] on 04/06/21 at  8:00 AM EST by telephone and verified that I am speaking with the correct person using two identifiers. ? ?Location: ?Patient: home ?Provider: home office ?  ?I discussed the limitations, risks, security and privacy concerns of performing an evaluation and management service by telephone and the availability of in person appointments. I also discussed with the patient that there may be a patient responsible charge related to this service. The patient expressed understanding and agreed to proceed. ? ?I discussed the assessment and treatment plan with the patient. The patient was provided an opportunity to ask questions and all were answered. The patient agreed with the plan and demonstrated an understanding of the instructions. ?  ?The patient was advised to call back or seek an in-person evaluation if the symptoms worsen or if the condition fails to improve as anticipated. ? ?I provided 30 minutes of non-face-to-face time during this encounter. ? ?THERAPIST PROGRESS NOTE ? ?Session Time: 8:00 AM to 8:30 AM ? ?Participation Level: Active ? ?Behavioral Response: CasualAlertAnxious and Dysphoric ? ?Type of Therapy: Individual Therapy ? ?Treatment Goals addressed: Patient continues to find therapy helpful for supportive and strength-based interventions as well as continue to focus help cope with health issues, anxiety, triggers, coping, utilize therapy as a way for patient to focus on working through current stressors that also helps to distract from pain ? ?ProgressTowards Goals: Progressing-patient actively works on treatment goals to help her with stressors of navigating healthcare system, anxiety triggers by processing feelings reviewing some of the coping strategies she uses such as writing down questions to ask doctors.  Helps her manage her frustrations with healthcare system ? ?Interventions: Solution Focused, Strength-based,  Supportive, and Other: Coping ? ?Summary: Lindsay [name] is a 51 y.o. female who presents with says she is the same, power went off on the 7th and off in the middle of the night on 8th so couldn't go to doctor's appointment when supposed to, oxygen goes out. Has emergency tanks and uses conservatively Going to right down questions for PCP and pain management write questions so they can answer the questions if not within the time frame of the appointment they still need to answer her questions. PCP appointment on 3/22 and pain management on 3/17. Questions aim is so she wouldn't have to keep repeating herself about the same stuff. Increasing the dosage of pain patch max out strength of pain patch 20 mcg and not helping can't tell anything happening only skin getting irritated. Tries to sit up and move around beginning to hurt a lot when move therapist noticed a sound and patient validates it is pain. Feels ten times worse took medicine with Gatorade acid reflux for stomach to throat. Has medicine to take for that. Patient relates about to throw up so wants to finish call wants therapist to add another appointment and wants to do video next time. ? ?Therapist reviewed symptoms, facilitated expression of thoughts and feelings therapist noted the slow pace of patient getting the help she needs when she reports things are the same.  Reviewed upcoming appointments therapist assesses good idea for patient to write questions down as she explains if they do not answer on the appointment they still can get to it so her questions getting answered.  Assessed this is good coping in terms of working on treatment goals.  Therapist provided supportive interventions and validation for patient's frustrations with healthcare system.  Completed treatment  plan and patient gave consent to complete virtually patient finds therapy helpful for helping her manage her frustrations and for anxiety triggers.  Noted patient actively does this in  sessions.  Part of ongoing treatment interventions as problem solving around patient's issues.  Therapist provided active listening open questions supportive interventions. ? ?Suicidal/Homicidal: No ? ?Plan: Return again in 2 weeks.2.  Review progress patient is making with doctors in managing her health issues, address anxiety triggers, coping ? ?Diagnosis: Major depressive disorder, recurrent, moderate, generalized anxiety disorder, chronic PTSD, panic attacks ? ? ?Collaboration of Care: Other none needed ? ?Patient/Guardian was advised Release of Information must be obtained prior to any record release in order to collaborate their care with an outside provider. Patient/Guardian was advised if they have not already done so to contact the registration department to sign all necessary forms in order for [name] to release information regarding their care.  ? ?Consent: Patient/Guardian gives verbal consent for treatment and assignment of benefits for services provided during this visit. Patient/Guardian expressed understanding and agreed to proceed.  ? ?[name], LCSW ?04/06/2021 ? ?

## 2021-04-09 ENCOUNTER — Other Ambulatory Visit: Payer: Self-pay | Admitting: Obstetrics and Gynecology

## 2021-04-09 DIAGNOSIS — N921 Excessive and frequent menstruation with irregular cycle: Secondary | ICD-10-CM

## 2021-04-11 ENCOUNTER — Encounter: Payer: Self-pay | Admitting: Family Medicine

## 2021-04-11 DIAGNOSIS — N921 Excessive and frequent menstruation with irregular cycle: Secondary | ICD-10-CM

## 2021-04-11 MED ORDER — MEGESTROL ACETATE 40 MG PO TABS: 80.0000 mg | ORAL_TABLET | Freq: Every day | ORAL | 1 refills | Status: DC

## 2021-04-18 ENCOUNTER — Encounter: Payer: Self-pay | Admitting: Internal Medicine

## 2021-04-18 ENCOUNTER — Encounter: Payer: Medicare Other | Admitting: Internal Medicine

## 2021-04-19 ENCOUNTER — Encounter: Payer: Self-pay | Admitting: Internal Medicine

## 2021-04-20 ENCOUNTER — Ambulatory Visit (INDEPENDENT_AMBULATORY_CARE_PROVIDER_SITE_OTHER): Payer: Medicare Other | Admitting: Licensed Clinical Social Worker

## 2021-04-20 DIAGNOSIS — F410 Panic disorder [episodic paroxysmal anxiety] without agoraphobia: Secondary | ICD-10-CM

## 2021-04-20 DIAGNOSIS — F331 Major depressive disorder, recurrent, moderate: Secondary | ICD-10-CM | POA: Diagnosis not present

## 2021-04-20 DIAGNOSIS — F411 Generalized anxiety disorder: Secondary | ICD-10-CM | POA: Diagnosis not present

## 2021-04-20 DIAGNOSIS — F4312 Post-traumatic stress disorder, chronic: Secondary | ICD-10-CM

## 2021-04-20 NOTE — Progress Notes (Signed)
Virtual Visit via Telephone Note ? ?I connected with Lindsay [name] on 04/20/21 at  8:00 AM EDT by telephone and verified that I am speaking with the correct person using two identifiers. ? ?Location: ?Patient: home ?Provider: home office ?  ?I discussed the limitations, risks, security and privacy concerns of performing an evaluation and management service by telephone and the availability of in person appointments. I also discussed with the patient that there may be a patient responsible charge related to this service. The patient expressed understanding and agreed to proceed. ?  ?I discussed the assessment and treatment plan with the patient. The patient was provided an opportunity to ask questions and all were answered. The patient agreed with the plan and demonstrated an understanding of the instructions. ?  ?The patient was advised to call back or seek an in-person evaluation if the symptoms worsen or if the condition fails to improve as anticipated. ? ?I provided 20 minutes of non-face-to-face time during this encounter. ? ?THERAPIST PROGRESS NOTE ? ?Session Time: 8:00 AM to 8:20 AM ? ?Participation Level: Active ? ?Behavioral Response: CasualAlertDysphoric ? ?Type of Therapy: Individual Therapy ? ?Treatment Goals addressed: Patient continues to find therapy helpful for supportive and strength-based interventions as well as continue to focus help cope with health issues, anxiety, triggers, coping, utilize therapy as a way for patient to focus on working through current stressors that also helps to distract from pain ? ?ProgressTowards Goals: Progressing-patient's pain issues limits progress but uses session to help her with coping ? ?Interventions: Solution Focused, Strength-based, Supportive, and Other: Coping ? ?Summary: Lindsay [name] is a 51 y.o. female who presents with waiting referral to go through so can go to new pain management new guy doesn't have time and energy for her. He told her that. He said  that they were all over the place prescribing medicine didn't keep up good medical history.  Therapist asked patient how she felt about that and patient said "Got the hell out of there." Supposed to send referral to [facility]. Supposed to go to PCP this Wednesday transportation didn't show up. Used My Chart and doctor not even there. Supposed to go next week. Pain is really bad. Will ask Dr. Adele Schilder to take her off of Clorazepate (Tranxene) already have breathing problems. Chronic respiratory failure and COPD, therapist noted doctors are reluctant to prescribe pain pills with benzos so getting off the benzo should help her with pain meds which are clearly priority for her as she has had consistent pain from a long time now. Patient wants to keep short so can get to sleep didn't get rest last night.  ? ?Therapist reviewed symptoms, facilitated expression of thoughts and feelings utilizing this as treatment intervention to help patient process feelings significantly about medical and pain issues.  Therapist validated patient on her experiences the struggles with medical system to get help.  Provided positive feedback for patient switching pain clinics as history at when she is at now has been problematic.  Therapist continues to provide supportive and strength-based intervention.  Provided active listening open questions therapist provided support and space to patient as she shared her thoughts and feelings in session offered solution focused brief therapy and CBT     ? ?Suicidal/Homicidal: No ? ?Plan: Return again in 2 weeks.2.Review progress patient is making with doctors in managing her health issues, address anxiety triggers, coping ? ?Diagnosis: Major depressive disorder, recurrent, moderate, generalized anxiety disorder, chronic PTSD, panic attacks ? ?Collaboration of Care:  Other none needed ? ?Patient/Guardian was advised Release of Information must be obtained prior to any record release in order  to collaborate their care with an outside provider. Patient/Guardian was advised if they have not already done so to contact the registration department to sign all necessary forms in order for [name] to release information regarding their care.  ? ?Consent: Patient/Guardian gives verbal consent for treatment and assignment of benefits for services provided during this visit. Patient/Guardian expressed understanding and agreed to proceed.  ? ?[name], LCSW ?04/20/2021 ? ?

## 2021-04-23 ENCOUNTER — Other Ambulatory Visit: Payer: Self-pay | Admitting: Pulmonary Disease

## 2021-04-27 ENCOUNTER — Other Ambulatory Visit: Payer: Self-pay | Admitting: Internal Medicine

## 2021-04-27 DIAGNOSIS — K219 Gastro-esophageal reflux disease without esophagitis: Secondary | ICD-10-CM

## 2021-04-27 NOTE — Telephone Encounter (Signed)
Call to patient regarding refills . Patient will need to make an appointment prior to further refills.  Patient scheduled an appoint for 05/23/2021 at 9:15 AM ?

## 2021-04-30 ENCOUNTER — Telehealth: Payer: Self-pay | Admitting: *Deleted

## 2021-04-30 NOTE — Chronic Care Management (AMB) (Signed)
?  Care Management  ? ?Note ? ?04/30/2021 ?Name: Lindsay [name] MRN: [id number] DOB: May 13, 1970 ? ?[name] is a 51 y.o. year old female who is a primary care patient of Velna Ochs, MD. I reached out to [name] by phone today offer care coordination services.  ? ?Ms. Gillispie was given information about care management services today including:  ?Care management services include personalized support from designated clinical staff supervised by her physician, including individualized plan of care and coordination with other care providers ?24/7 contact phone numbers for assistance for urgent and routine care needs. ?The patient may stop care management services at any time by phone call to the office staff. ? ?Patient agreed to services and verbal consent obtained.  ? ?Follow up plan: ?Telephone appointment with care management team member scheduled for:05/09/21 ? ?Laverda Sorenson  ?Care Guide, Embedded Care Coordination ?[place]  [facility] Management  ?Direct Dial: [phone] ? ?

## 2021-05-01 ENCOUNTER — Other Ambulatory Visit: Payer: Self-pay

## 2021-05-01 DIAGNOSIS — K219 Gastro-esophageal reflux disease without esophagitis: Secondary | ICD-10-CM

## 2021-05-01 MED ORDER — ATORVASTATIN CALCIUM 40 MG PO TABS: 40.0000 mg | ORAL_TABLET | Freq: Every day | ORAL | 0 refills | Status: DC

## 2021-05-01 MED ORDER — OMEPRAZOLE 40 MG PO CPDR: 40.0000 mg | DELAYED_RELEASE_CAPSULE | Freq: Every day | ORAL | 0 refills | Status: DC

## 2021-05-01 NOTE — Telephone Encounter (Signed)
I did not get the first request. Approved 1 month refill to last until her appointment next month. Thanks.  ?

## 2021-05-04 ENCOUNTER — Ambulatory Visit (INDEPENDENT_AMBULATORY_CARE_PROVIDER_SITE_OTHER): Payer: Medicare Other | Admitting: Licensed Clinical Social Worker

## 2021-05-04 DIAGNOSIS — F331 Major depressive disorder, recurrent, moderate: Secondary | ICD-10-CM | POA: Diagnosis not present

## 2021-05-04 DIAGNOSIS — F4312 Post-traumatic stress disorder, chronic: Secondary | ICD-10-CM

## 2021-05-04 DIAGNOSIS — F411 Generalized anxiety disorder: Secondary | ICD-10-CM

## 2021-05-04 DIAGNOSIS — F410 Panic disorder [episodic paroxysmal anxiety] without agoraphobia: Secondary | ICD-10-CM

## 2021-05-04 NOTE — Progress Notes (Signed)
Virtual Visit via Video Note ? ?I connected with [name] on 05/04/21 at  8:00 AM EDT by a video enabled telemedicine application and verified that I am speaking with the correct person using two identifiers. ? ?Location: ?Patient: home ?Provider: home office ?  ?I discussed the limitations of evaluation and management by telemedicine and the availability of in person appointments. The patient expressed understanding and agreed to proceed. ? ?I discussed the assessment and treatment plan with the patient. The patient was provided an opportunity to ask questions and all were answered. The patient agreed with the plan and demonstrated an understanding of the instructions. ?  ?The patient was advised to call back or seek an in-person evaluation if the symptoms worsen or if the condition fails to improve as anticipated. ? ?I provided 40 minutes of non-face-to-face time during this encounter. ? ?THERAPIST PROGRESS NOTE ? ?Session Time: 8:00 AM to 8:40 AM ? ?Participation Level: Active ? ?Behavioral Response: CasualAlertsubdued ? ?Type of Therapy: Individual Therapy ? ?Treatment Goals addressed: atient continues to find therapy helpful for supportive and strength-based interventions as well as continue to focus help cope with health issues, anxiety, triggers, coping, utilize therapy as a way for patient to focus on working through current stressors that also helps to distract from pain ? ?ProgressTowards Goals: Progressing-patient experiencing significant symptoms but actively uses therapy to help her with coping through processing feelings identifying problem solving strategies ? ?Interventions: CBT, Solution Focused, Strength-based, Supportive, and Other: Coping ? ?Summary: Murry Khiev is a 51 y.o. female who presents with she is the same if not worse. Goes there the 17th to [facility] pain Management. Team of people works with the doctor calling on 12th connected to PCP Dr. Philipp Ovens. If case management  patient says she will use it to the best of her ability. Has a new bed now so is pretty high.  Reports noted this is positive development. Pain is the same and bad. Been up all night. Takes the psych meds and then hour later muscle relaxant Tylenol then takes starts medicines at 5 AM. heart medicine, vertigo and acid reflux. Takes that early because she is up.  Therapist explored what she does when she is up and patient says lay on side watch the tablet and other times right side and listen to things. Watches comedy series Hunter and Everybody loves Gerald Stabs. So stressed does not watch the comedians nothing funny life is so serious. Run into things are triggers showing or saying that would trigger a panic attack. Now watching Lujean Rave, Karate, Gomer Pyle, patient said and therapist agreed and stuff you watch when you are older as noted she is doing the same thing too. Used to do paint by numbers and color when young.  Therapist asked what patient gravitates toward her interest and patient shares gravitate to sanity and laying down.    ? ?This was her first video session together and very positive to finally be able to meet and see each other therapist assesses helps strengthen relationship.  Therapist also able to see more clearly patient dealing with medical issues from video.  Patient herself noted can even watch calming as that is a trigger and therapist pointed out that to sign of significant mental health why important for patient to stay connected to therapist and to get mental health treatment.  Additionally seeing patient dealing with medical issues indicates need of strong support and compassionate interventions as the same time therapist provides her  perspective of being hopeful and continuing to seek help from medical providers and the persistence is a strength, a good coping strategy and gets results.  Therapist feels this will be the case with patient and 1 big hurdle is getting pain better  managed.  Spent session on later discussion which is positive talking about interest, things we enjoy shows we like as talking about the subjects therapist assesses helps with positive mood.  Noted patient getting case management and therapist noting this is something patient really needs and will be a good thing if she is connected and also patient's own attitude of using it to the best she can also positive sign for utilizing services to help her.  Therapist provided active listening open questions supportive interventions therapist provided support and space to patient as she shared her thoughts and feelings in session utilize solution focused brief therapy and CBT to work on mood and problem-solving ?Suicidal/Homicidal: No ? ?Plan: Return again in 4 weeks.2.Review progress patient is making with doctors in managing her health issues, address anxiety triggers, coping ? ?Diagnosis: Major depressive disorder, recurrent, moderate, generalized anxiety disorder, chronic PTSD, panic attacks ? ?Collaboration of Care: Other none needed ? ?Patient/Guardian was advised Release of Information must be obtained prior to any record release in order to collaborate their care with an outside provider. Patient/Guardian was advised if they have not already done so to contact the registration department to sign all necessary forms in order for [name] to release information regarding their care.  ? ?Consent: Patient/Guardian gives verbal consent for treatment and assignment of benefits for services provided during this visit. Patient/Guardian expressed understanding and agreed to proceed.  ? ?[name], LCSW ?05/04/2021 ? ?

## 2021-05-06 DIAGNOSIS — J9620 Acute and chronic respiratory failure, unspecified whether with hypoxia or hypercapnia: Secondary | ICD-10-CM | POA: Diagnosis not present

## 2021-05-08 ENCOUNTER — Other Ambulatory Visit: Payer: Self-pay

## 2021-05-09 ENCOUNTER — Telehealth: Payer: Self-pay | Admitting: Internal Medicine

## 2021-05-09 ENCOUNTER — Telehealth: Payer: Self-pay

## 2021-05-09 ENCOUNTER — Ambulatory Visit: Payer: Medicare Other

## 2021-05-09 DIAGNOSIS — K219 Gastro-esophageal reflux disease without esophagitis: Secondary | ICD-10-CM

## 2021-05-09 DIAGNOSIS — E1121 Type 2 diabetes mellitus with diabetic nephropathy: Secondary | ICD-10-CM

## 2021-05-09 MED ORDER — OZEMPIC (1 MG/DOSE) 4 MG/3ML ~~LOC~~ SOPN: PEN_INJECTOR | SUBCUTANEOUS | 2 refills | Status: DC

## 2021-05-09 NOTE — Chronic Care Management (AMB) (Signed)
? Care Management ?  ? RN Visit Note ? ?05/09/2021 ?Name: Lindsay [name] MRN: [id number] DOB: Apr 15, 1970 ? ?Subjective: ?Lindsay [name] is a 51 y.o. year old female who is a primary care patient of Velna Ochs, MD. The care management team was consulted for assistance with disease management and care coordination needs.   ? ?Engaged with patient by telephone for initial visit in response to provider referral for case management and/or care coordination services.  ? ?Consent to Services:  ? [name] was given information about Care Management services today including:  ?Care Management services includes personalized support from designated clinical staff supervised by her physician, including individualized plan of care and coordination with other care providers ?24/7 contact phone numbers for assistance for urgent and routine care needs. ?The patient may stop case management services at any time by phone call to the office staff. ? ?Patient agreed to services and consent obtained.  ? ?Assessment: Review of patient past medical history, allergies, medications, health status, including review of consultants reports, laboratory and other test data, was performed as part of comprehensive evaluation and provision of chronic care management services.  ? ?SDOH (Social Determinants of Health) assessments and interventions performed:   ? ?Care Plan ? ?Allergies  ?Allergen Reactions  ? Lisinopril Rash and Cough  ? ? ?Outpatient Encounter Medications as of 05/09/2021  ?Medication Sig  ? ACCU-CHEK GUIDE test strip USE ONE TEST STRIP TO CHECK BLOOD SUGARS ONCE A DAY AS NEEDED  ? Accu-Chek Softclix Lancets lancets USE ONE LANCET TO CHECK BLOOD SUGAR ONE A DAY AS NEEDED  ? albuterol (PROVENTIL) (2.5 MG/3ML) 0.083% nebulizer solution Take 3 mLs (2.5 mg total) by nebulization every 4 (four) hours as needed for wheezing or shortness of breath.  ? albuterol (VENTOLIN HFA) 108 (90 Base) MCG/ACT inhaler TAKE 2 PUFFS BY MOUTH EVERY 6  HOURS AS NEEDED FOR WHEEZE OR SHORTNESS OF BREATH  ? Ascorbic Acid (VITAMIN C PO) Take 1 tablet by mouth daily.  ? atorvastatin (LIPITOR) 40 MG tablet Take 1 tablet (40 mg total) by mouth daily.  ? Blood Glucose Monitoring Suppl (ACCU-CHEK GUIDE ME) w/Device KIT Dispense one device  ? Brexpiprazole (REXULTI) 3 MG TABS Take 1 tablet (3 mg total) by mouth daily.  ? buprenorphine (BUTRANS) 20 MCG/HR PTWK 1 patch once a week.  ? cetirizine (ZYRTEC) 10 MG tablet TAKE 1 TABLET BY MOUTH EVERY DAY  ? cholecalciferol (VITAMIN D) 25 MCG (1000 UNIT) tablet TAKE 1 TABLET BY MOUTH EVERY DAY  ? clorazepate (TRANXENE) 3.75 MG tablet Take 1 tablet (3.75 mg total) by mouth daily.  ? diclofenac Sodium (VOLTAREN) 1 % GEL APPLY 2 GRAMS TOPICALLY 4 (FOUR) TIMES DAILY AS NEEDED FOR KNEE PAIN  ? famotidine (PEPCID) 20 MG tablet TAKE 1 TABLET BY MOUTH EVERY DAY  ? lamoTRIgine (LAMICTAL) 150 MG tablet Take 1 tablet (150 mg total) by mouth daily.  ? lidocaine (LIDODERM) 5 % Place 2 patches onto the skin daily. Remove & Discard patch within 12 hours or as directed by MD  ? meclizine (ANTIVERT) 25 MG tablet TAKE 1 TABLET BY MOUTH 3 TIMES A DAY AS NEEDED FOR DIZZINESS  ? megestrol (MEGACE) 40 MG tablet Take 2 tablets (80 mg total) by mouth daily.  ? mirtazapine (REMERON) 30 MG tablet Take 1 tablet (30 mg total) by mouth at bedtime.  ? omeprazole (PRILOSEC) 40 MG capsule Take 1 capsule (40 mg total) by mouth daily.  ? promethazine (PHENERGAN) 12.5 MG tablet TAKE 1 TABLET  BY MOUTH EVERY 6 HOURS AS NEEDED  ? Semaglutide, 1 MG/DOSE, (OZEMPIC, 1 MG/DOSE,) 4 MG/3ML SOPN INJECT 1MG INTO THE SKIN ONCE A WEEK  ? torsemide (DEMADEX) 20 MG tablet TAKE 2 TABLETS BY MOUTH TWICE A DAY  ? ADDYI 100 MG TABS TAKE 1 TABLET BY MOUTH EVERY DAY  ? buprenorphine (BUTRANS) 5 MCG/HR PTWK APPLY 1 PATCH EVERY WEEK (Patient not taking: Reported on 05/09/2021)  ? Capsaicin 0.025 % PTCH Apply 1 patch topically daily as needed.  ? clindamycin (CLINDAGEL) 1 % gel APPLY TO  AFFECTED AREA TWICE A DAY  ? doxycycline (VIBRA-TABS) 100 MG tablet Take 1 tablet (100 mg total) by mouth 2 (two) times daily. (Patient not taking: Reported on 05/09/2021)  ? ENTRESTO 49-51 MG Take 1 tablet by mouth 2 (two) times daily.  ? fluconazole (DIFLUCAN) 150 MG tablet Please take 1 tablet by mouth. Repeat in 3 days if symptoms persist. (Patient not taking: Reported on 05/09/2021)  ? gabapentin (NEURONTIN) 300 MG capsule Take 3 capsules (900 mg total) by mouth at bedtime. (Patient not taking: Reported on 10/16/2020)  ? ipratropium (ATROVENT) 0.06 % nasal spray PLEASE SEE ATTACHED FOR DETAILED DIRECTIONS  ? lidocaine (XYLOCAINE) 5 % ointment Apply 1 application topically as needed for mild pain.  ? metFORMIN (GLUCOPHAGE-XR) 500 MG 24 hr tablet TAKE 2 TABLETS BY MOUTH EVERY DAY WITH BREAKFAST (Patient not taking: Reported on 05/09/2021)  ? metoprolol succinate (TOPROL-XL) 50 MG 24 hr tablet Take 50 mg by mouth daily.  ? SYMBICORT 160-4.5 MCG/ACT inhaler INHALE 2 PUFFS INTO THE LUNGS IN THE MORNING AND AT BEDTIME.  ? tiZANidine (ZANAFLEX) 4 MG tablet Take 4 mg by mouth 3 (three) times daily as needed.  ? vitamin B-12 (CYANOCOBALAMIN) 1000 MCG tablet Take 1 tablet (1,000 mcg total) by mouth daily.  ? Vitamin E 400 units TABS Take 400 Units by mouth daily.   ? ?No facility-administered encounter medications on file as of 05/09/2021.  ? ? ?Patient Active Problem List  ? Diagnosis Date Noted  ? Amenorrhea 11/08/2020  ? Memory problem 11/08/2020  ? Onychomycosis 11/08/2020  ? Hypoactive sexual desire disorder due to medical condition in female 10/16/2020  ? Hospital discharge follow-up 10/09/2020  ? Diabetes ([place]) 02/03/2020  ? HLD (hyperlipidemia) 02/03/2020  ? Dizziness 02/02/2020  ? Pain in both knees 12/09/2019  ? Chronic heart failure ([place]) 11/25/2019  ? OSA (obstructive sleep apnea) 06/14/2019  ? Right shoulder pain 04/21/2019  ? Gum lesion 03/15/2019  ? Compression fracture of body of thoracic vertebra ([place])  03/08/2019  ? Iron deficiency 02/10/2019  ? History of colon polyps   ? Chronic back pain 12/05/2017  ? Chronic respiratory failure with hypoxia (H[facility])   ? Vitamin B12 deficiency 05/02/2017  ? Palpitations 04/11/2017  ? Vitamin D deficiency, unspecified 06/12/2016  ? Nicotine dependence 06/12/2016  ? Migraines 01/08/2016  ? Leg pain, bilateral 11/29/2015  ? Fibromyalgia 07/05/2015  ? Chronic leg pain 09/26/2014  ? Healthcare maintenance 09/26/2014  ? Morbid obesity ([place]) 07/04/2014  ? Major depressive disorder, recurrent episode, moderate ([place]) 10/13/2012  ? Generalized anxiety disorder 10/13/2012  ? Vaginal candidiasis 11/27/2010  ? Chronic nausea 05/10/2010  ? IBS (irritable bowel syndrome) 06/16/2007  ? Essential hypertension 02/06/2006  ? Hidradenitis 11/18/2005  ? ? ?Conditions to be addressed/monitored: HTN, DMII, Anxiety, Depression, and Fibromyalgia ? ?Care Plan : RN Care Manager Plan of Care  ?Updates made by Johnney Killian, RN since 05/09/2021 12:00 AM  ?  ? ?Problem: Health  Promotion or Disease Self-Management (General Plan of Care)   ?  ? ?Long-Range Goal: Chronic Disease Management and Care Coordination Needs (HTN, DM2, Fibromyalgia, Chronic Pain)   ?Start Date: 05/09/2021  ?This Visit's Progress: Not on track  ?Priority: High  ?Note:   ?Current Barriers: Successful initial outreach to patient this morning.  We discussed her medications and patient is in need of more Ozempic.  Message sent to Dr. Philipp Ovens and [facility] Yellow team requesting refill.  Patient notes her pain is somewhat worse but she shared she has an appointment at [facility] coming up to address her pain and she is hopeful that they will be able to help control her pain. Patient had an MRI of her legs and she has a torn meniscus of her left leg and she will address with Dr. Philipp Ovens. ?Patient asked about the CAP program and she shared she was told she has an open case for 155 days.  They are requesting medical documentation and  encouraged patient to discuss with PCP when she has her appointment on 05/23/21.  Discussed a meeting with Milus Height, [place] and let the patient know I would reach out to [facility] and have an appointment scheduled with

## 2021-05-09 NOTE — Telephone Encounter (Signed)
error 

## 2021-05-09 NOTE — Telephone Encounter (Signed)
Ozempic refill approved and [facility].  ?

## 2021-05-09 NOTE — Patient Instructions (Signed)
Visit Information ? ?Thank you for taking time to visit with me today. Please don't hesitate to contact me if I can be of assistance to you before our next scheduled telephone appointment. ? ?Our next appointment is by telephone on 05/21/21 at 0900. ? ?Please call the care guide team at [phone] if you need to cancel or reschedule your appointment.  ? ?If you are experiencing a Mental Health or [place] or need someone to talk to, please call the [place] National Suicide Prevention Lifeline: (808) 200-1421 or TTY: 2723178757 TTY [phone]) to talk to a trained counselor  ? ?Patient verbalizes understanding of instructions and care plan provided today and agrees to view in [place]. Active MyChart status confirmed with patient.   ? ?The patient has been provided with contact information for the care management team and has been advised to call with any health related questions or concerns.  ?Johnney Killian, RN, BSN, CCM ?Care Management Coordinator ?[facility] ?Phone: [phone]/BVA: [phone]  ?

## 2021-05-09 NOTE — Telephone Encounter (Signed)
-----   Message from Johnney Killian, RN sent at 05/09/2021  8:51 AM EDT ----- ?Regarding: Ozempic refill ?Good morning, ?I had an initial call with Ms. Willet this morning and she requested a refill on her Ozempic.  Her last prescription was on 03/30/21 with no refills.  [facility] ?Thank you, ?Deb ?Johnney Killian, RN, BSN, CCM ?Care Management Coordinator ?[facility] ?Phone: [phone]/UJW: [phone]  ? ?

## 2021-05-14 DIAGNOSIS — M545 Low back pain, unspecified: Secondary | ICD-10-CM | POA: Diagnosis not present

## 2021-05-14 DIAGNOSIS — Z0000 Encounter for general adult medical examination without abnormal findings: Secondary | ICD-10-CM | POA: Diagnosis not present

## 2021-05-14 DIAGNOSIS — G8929 Other chronic pain: Secondary | ICD-10-CM | POA: Diagnosis not present

## 2021-05-14 DIAGNOSIS — Z131 Encounter for screening for diabetes mellitus: Secondary | ICD-10-CM | POA: Diagnosis not present

## 2021-05-14 DIAGNOSIS — R030 Elevated blood-pressure reading, without diagnosis of hypertension: Secondary | ICD-10-CM | POA: Diagnosis not present

## 2021-05-14 DIAGNOSIS — M542 Cervicalgia: Secondary | ICD-10-CM | POA: Diagnosis not present

## 2021-05-14 DIAGNOSIS — Z79899 Other long term (current) drug therapy: Secondary | ICD-10-CM | POA: Diagnosis not present

## 2021-05-14 DIAGNOSIS — E559 Vitamin D deficiency, unspecified: Secondary | ICD-10-CM | POA: Diagnosis not present

## 2021-05-14 DIAGNOSIS — I10 Essential (primary) hypertension: Secondary | ICD-10-CM | POA: Diagnosis not present

## 2021-05-14 DIAGNOSIS — R5383 Other fatigue: Secondary | ICD-10-CM | POA: Diagnosis not present

## 2021-05-14 DIAGNOSIS — Z8679 Personal history of other diseases of the circulatory system: Secondary | ICD-10-CM | POA: Diagnosis not present

## 2021-05-14 DIAGNOSIS — M25569 Pain in unspecified knee: Secondary | ICD-10-CM | POA: Diagnosis not present

## 2021-05-17 ENCOUNTER — Other Ambulatory Visit (HOSPITAL_COMMUNITY): Payer: Self-pay | Admitting: Psychiatry

## 2021-05-17 DIAGNOSIS — F331 Major depressive disorder, recurrent, moderate: Secondary | ICD-10-CM

## 2021-05-18 ENCOUNTER — Ambulatory Visit (HOSPITAL_COMMUNITY): Payer: Medicare Other | Admitting: Licensed Clinical Social Worker

## 2021-05-20 ENCOUNTER — Other Ambulatory Visit: Payer: Self-pay | Admitting: Internal Medicine

## 2021-05-22 ENCOUNTER — Telehealth: Payer: Self-pay

## 2021-05-22 NOTE — Telephone Encounter (Signed)
Pt stated Lindsay [name] ,who prescribed an opoid  [name] patch,sent her to [facility] [name] management who stated they could not help her. And she called Lindsay back but no one will return her calls. So now she does not know what to do about the [name] medication. Stated she's in a lot of [name]. ? ?

## 2021-05-22 NOTE — Telephone Encounter (Signed)
Patient called she stated she was referred to [facility] medical pain management and was told they can no longer help her pt is requesting a call back. ?

## 2021-05-23 ENCOUNTER — Other Ambulatory Visit: Payer: Self-pay

## 2021-05-23 ENCOUNTER — Encounter: Payer: Self-pay | Admitting: Internal Medicine

## 2021-05-23 ENCOUNTER — Ambulatory Visit (INDEPENDENT_AMBULATORY_CARE_PROVIDER_SITE_OTHER): Payer: Medicare Other | Admitting: Internal Medicine

## 2021-05-23 VITALS — BP 113/63 | HR 92 | Temp 99.1°F | Ht 70.0 in | Wt 379.4 lb

## 2021-05-23 DIAGNOSIS — I10 Essential (primary) hypertension: Secondary | ICD-10-CM

## 2021-05-23 DIAGNOSIS — E785 Hyperlipidemia, unspecified: Secondary | ICD-10-CM

## 2021-05-23 DIAGNOSIS — Z87828 Personal history of other (healed) physical injury and trauma: Secondary | ICD-10-CM | POA: Insufficient documentation

## 2021-05-23 DIAGNOSIS — M549 Dorsalgia, unspecified: Secondary | ICD-10-CM | POA: Diagnosis not present

## 2021-05-23 DIAGNOSIS — G8929 Other chronic pain: Secondary | ICD-10-CM | POA: Diagnosis not present

## 2021-05-23 DIAGNOSIS — E611 Iron deficiency: Secondary | ICD-10-CM

## 2021-05-23 DIAGNOSIS — E559 Vitamin D deficiency, unspecified: Secondary | ICD-10-CM | POA: Diagnosis not present

## 2021-05-23 DIAGNOSIS — E1121 Type 2 diabetes mellitus with diabetic nephropathy: Secondary | ICD-10-CM

## 2021-05-23 DIAGNOSIS — Z8673 Personal history of transient ischemic attack (TIA), and cerebral infarction without residual deficits: Secondary | ICD-10-CM | POA: Diagnosis not present

## 2021-05-23 DIAGNOSIS — M5430 Sciatica, unspecified side: Secondary | ICD-10-CM

## 2021-05-23 LAB — POCT GLYCOSYLATED HEMOGLOBIN (HGB A1C): Hemoglobin A1C: 5.5 % (ref 4.0–5.6)

## 2021-05-23 LAB — GLUCOSE, CAPILLARY: Glucose-Capillary: 94 mg/dL (ref 70–99)

## 2021-05-23 MED ORDER — OZEMPIC (2 MG/DOSE) 8 MG/3ML ~~LOC~~ SOPN: 2.0000 mg | PEN_INJECTOR | SUBCUTANEOUS | 3 refills | Status: DC

## 2021-05-23 MED ORDER — ATORVASTATIN CALCIUM 40 MG PO TABS: 40.0000 mg | ORAL_TABLET | Freq: Every day | ORAL | 3 refills | Status: DC

## 2021-05-23 MED ORDER — METOPROLOL SUCCINATE ER 50 MG PO TB24: 50.0000 mg | ORAL_TABLET | Freq: Every day | ORAL | 3 refills | Status: DC

## 2021-05-23 NOTE — Assessment & Plan Note (Signed)
Patient had MRIs of her bilateral knees 4 months ago and was noted to have a nondisplaced horizontal tear of the posterior horn and body of the left lateral meniscus.  She was asking about treatment for this today.  Offered orthopedic surgery referral versus physical therapy.  Discussed that she is not an ideal surgical candidate with her obesity and chronic hypoxic respiratory failure and explained that these meniscal tears are often managed nonoperatively anyways.  We will start with physical therapy.  Due to her limited physical mobility and difficulty with transportation, she is requesting home health physical therapy.  Face-to-face order has been placed. ?

## 2021-05-23 NOTE — Assessment & Plan Note (Signed)
Chronic and well-controlled on metoprolol 50 mg XL daily and Entresto 49-51 mg daily.  Refills of metoprolol were sent to her pharmacy.  Continue current regimen.  Labs recently checked with [facility] showed a normal CMP with serum creatinine of 0.9, sodium 143, potassium 4.8. ?

## 2021-05-23 NOTE — Progress Notes (Signed)
?Subjective:  ? ?Patient ID: Lindsay [name] female   DOB: 11-28-1970 51 y.o.   MRN: [id number] ? ?HPI: ?Lindsay [name] is a 51 y.o. female with past medical history outlined below here for follow up of her chronic medical conditions. For the details of today's visit, please refer to the assessment and plan. ? ? ?Past Medical History:  ?Diagnosis Date  ? Acid reflux   ? Anemia   ? Iron Def  ? Anorexia   ? CHF (congestive heart failure) ([place])   ? Chronic kidney disease   ? Nephrotic syndrome  ? Colon polyp 2009  ? Depression with anxiety   ? Edema leg   ? Fibromyalgia   ? Hemorrhoids   ? Hidradenitis suppurativa   ? Hypertension   ? IBS (irritable bowel syndrome)   ? Low back pain   ? Migraines   ? Morbidly obese ([place])   ? Neuromuscular disorder ([place])   ? fibromyalgia  ? Neuropathy   ? Panic attacks   ? Polyp of vocal cord or larynx   ? Tonsil pain   ? ?Current Outpatient Medications  ?Medication Sig Dispense Refill  ? Semaglutide, 2 MG/DOSE, (OZEMPIC, 2 MG/DOSE,) 8 MG/3ML SOPN Inject 2 mg into the skin once a week. 3 mL 3  ? ACCU-CHEK GUIDE test strip USE ONE TEST STRIP TO CHECK BLOOD SUGARS ONCE A DAY AS NEEDED 100 strip 1  ? Accu-Chek Softclix Lancets lancets USE ONE LANCET TO CHECK BLOOD SUGAR ONE A DAY AS NEEDED 100 each 1  ? ADDYI 100 MG TABS TAKE 1 TABLET BY MOUTH EVERY DAY 30 tablet 5  ? albuterol (PROVENTIL) (2.5 MG/3ML) 0.083% nebulizer solution Take 3 mLs (2.5 mg total) by nebulization every 4 (four) hours as needed for wheezing or shortness of breath. 120 mL 0  ? albuterol (VENTOLIN HFA) 108 (90 Base) MCG/ACT inhaler TAKE 2 PUFFS BY MOUTH EVERY 6 HOURS AS NEEDED FOR WHEEZE OR SHORTNESS OF BREATH 6.7 each 0  ? Ascorbic Acid (VITAMIN C PO) Take 1 tablet by mouth daily.    ? atorvastatin (LIPITOR) 40 MG tablet Take 1 tablet (40 mg total) by mouth daily. 90 tablet 3  ? Blood Glucose Monitoring Suppl (ACCU-CHEK GUIDE ME) w/Device KIT Dispense one device 1 kit 0  ? Brexpiprazole (REXULTI) 3 MG TABS Take 1 tablet  (3 mg total) by mouth daily. 90 tablet 0  ? buprenorphine (BUTRANS) 20 MCG/HR PTWK 1 patch once a week.    ? buprenorphine (BUTRANS) 5 MCG/HR PTWK APPLY 1 PATCH EVERY WEEK (Patient not taking: Reported on 05/09/2021)    ? Capsaicin 0.025 % PTCH Apply 1 patch topically daily as needed. 10 patch 9  ? cetirizine (ZYRTEC) 10 MG tablet TAKE 1 TABLET BY MOUTH EVERY DAY 90 tablet 1  ? cholecalciferol (VITAMIN D) 25 MCG (1000 UNIT) tablet TAKE 1 TABLET BY MOUTH EVERY DAY 90 tablet 1  ? clindamycin (CLINDAGEL) 1 % gel APPLY TO AFFECTED AREA TWICE A DAY 30 g 0  ? clorazepate (TRANXENE) 3.75 MG tablet Take 1 tablet (3.75 mg total) by mouth daily. 30 tablet 2  ? diclofenac Sodium (VOLTAREN) 1 % GEL APPLY 2 GRAMS TOPICALLY 4 (FOUR) TIMES DAILY AS NEEDED FOR KNEE PAIN 400 g 1  ? doxycycline (VIBRA-TABS) 100 MG tablet Take 1 tablet (100 mg total) by mouth 2 (two) times daily. (Patient not taking: Reported on 05/09/2021) 14 tablet 0  ? ENTRESTO 49-51 MG Take 1 tablet by mouth 2 (two) times daily.    ?  famotidine (PEPCID) 20 MG tablet TAKE 1 TABLET BY MOUTH EVERY DAY 90 tablet 1  ? fluconazole (DIFLUCAN) 150 MG tablet Please take 1 tablet by mouth. Repeat in 3 days if symptoms persist. (Patient not taking: Reported on 05/09/2021) 2 tablet 0  ? gabapentin (NEURONTIN) 300 MG capsule Take 3 capsules (900 mg total) by mouth at bedtime. (Patient not taking: Reported on 10/16/2020) 270 capsule 0  ? ipratropium (ATROVENT) 0.06 % nasal spray PLEASE SEE ATTACHED FOR DETAILED DIRECTIONS    ? lamoTRIgine (LAMICTAL) 150 MG tablet Take 1 tablet (150 mg total) by mouth daily. 90 tablet 0  ? lidocaine (LIDODERM) 5 % Place 2 patches onto the skin daily. Remove & Discard patch within 12 hours or as directed by MD 30 patch 0  ? lidocaine (XYLOCAINE) 5 % ointment Apply 1 application topically as needed for mild pain. 50 g 0  ? meclizine (ANTIVERT) 25 MG tablet TAKE 1 TABLET BY MOUTH 3 TIMES A DAY AS NEEDED FOR DIZZINESS 90 tablet 2  ? megestrol (MEGACE)  40 MG tablet Take 2 tablets (80 mg total) by mouth daily. 180 tablet 1  ? metFORMIN (GLUCOPHAGE-XR) 500 MG 24 hr tablet TAKE 2 TABLETS BY MOUTH EVERY DAY WITH BREAKFAST (Patient not taking: Reported on 05/09/2021) 180 tablet 0  ? metoprolol succinate (TOPROL-XL) 50 MG 24 hr tablet Take 1 tablet (50 mg total) by mouth daily. 90 tablet 3  ? mirtazapine (REMERON) 30 MG tablet Take 1 tablet (30 mg total) by mouth at bedtime. 90 tablet 0  ? omeprazole (PRILOSEC) 40 MG capsule Take 1 capsule (40 mg total) by mouth daily. 30 capsule 0  ? promethazine (PHENERGAN) 12.5 MG tablet TAKE 1 TABLET BY MOUTH EVERY 6 HOURS AS NEEDED 15 tablet 0  ? SYMBICORT 160-4.5 MCG/ACT inhaler INHALE 2 PUFFS INTO THE LUNGS IN THE MORNING AND AT BEDTIME. 10.2 each 0  ? tiZANidine (ZANAFLEX) 4 MG tablet Take 4 mg by mouth 3 (three) times daily as needed.    ? torsemide (DEMADEX) 20 MG tablet TAKE 2 TABLETS BY MOUTH TWICE A DAY 360 tablet 1  ? vitamin B-12 (CYANOCOBALAMIN) 1000 MCG tablet Take 1 tablet (1,000 mcg total) by mouth daily. 30 tablet 3  ? Vitamin E 400 units TABS Take 400 Units by mouth daily.     ? ?No current facility-administered medications for this visit.  ? ?Family History  ?Problem Relation Age of Onset  ? Diabetes Mother   ? Heart disease Mother   ?     valve leak  ? Anxiety disorder Mother   ? Depression Mother   ? High blood pressure Mother   ? Kidney failure Mother   ? Cancer Father   ?     prostate  ? Heart disease Father   ? Learning disabilities Sister   ? Depression Sister   ? Depression Sister   ? Anxiety disorder Sister   ? Colon cancer Neg Hx   ? ?Social History  ? ?Socioeconomic History  ? Marital status: Single  ?  Spouse name: Not on file  ? Number of children: 1  ? Years of education: 77  ? Highest education level: Not on file  ?Occupational History  ? Occupation: Disabled  ?Tobacco Use  ? Smoking status: Former  ?  Packs/day: 0.10  ?  Years: 25.00  ?  Pack years: 2.50  ?  Types: Cigarettes  ?  Quit date: 05/24/2019   ?  Years since quitting: 2.0  ?  Smokeless tobacco: Never  ? Tobacco comments:  ?  quit in April.  ?Vaping Use  ? Vaping Use: Never used  ?Substance and Sexual Activity  ? Alcohol use: Not Currently  ?  Alcohol/week: 0.0 standard drinks  ?  Comment: none sine 05/2018  ? Drug use: Not Currently  ?  Frequency: 3.0 times per week  ?  Types: Marijuana  ? Sexual activity: Yes  ?  Birth control/protection: Implant  ?Other Topics Concern  ? Not on file  ?Social History Narrative  ? Lives at home w/ her mother and daughter  ? Right-handed  ? Caffeine: about 3 Cokes per week  ? ?Social Determinants of Health  ? ?Financial Resource Strain: Not on file  ?Food Insecurity: Not on file  ?Transportation Needs: Not on file  ?Physical Activity: Not on file  ?Stress: Not on file  ?Social Connections: Not on file  ? ? ?Review of Systems: ?Review of Systems  ?Musculoskeletal:  Positive for back pain and joint pain.   ? ?Objective:  ?Physical Exam: ? ?Vitals:  ? 05/23/21 0913  ?BP: 113/63  ?Pulse: 92  ?Temp: 99.1 ?F (37.3 ?C)  ?TempSrc: Oral  ?SpO2: 98%  ?Weight: (!) 379 lb 6.4 oz (172.1 kg)  ?Height: _0  (1.778 m)  ? ? ?Constitutional: Obese, NAD ?Cardiovascular: RRR, no m/r/g ?Pulmonary/Chest: distant breath sounds but clear, normal effort ?Extremities: 1+ edema above the ankles bilaterally  ?Psychiatric: normal mood and affect ? ?Assessment & Plan:  ? ?Vitamin D deficiency, unspecified ?Recently checked at [facility] medical, results at 9. Monitor.  ? ?Iron deficiency ?Patient has a history of IDA and has been off her iron supplementation since November.  She continues to have abnormal uterine bleeding and is taking Megace as prescribed by her gynecologist.  She also has a Mirena IUD in place.  She recently had labs checked with [facility] and I was able to review these results on her phone.  CBC showed a hemoglobin of 11, MCV of 97, and platelets of 447.  Overall stable from her labs last checked in our system 7 months  ago.  I am checking an iron panel today and will restart iron supplementation if needed.  Encouraged her to follow-up with her OB/GYN for her uterine bleeding. ? ?History of torn meniscus of left knee

## 2021-05-23 NOTE — Assessment & Plan Note (Addendum)
Patient has chronic lumbar back pain with sciatica and was following with [facility] for pain management.  She was being treated with a transdermal buprenorphine patch. The details are unclear, but apparently there was a change in provider at this practice and she has since been referred to [facility] for pain management, who has declined to keep her as a patient. Patient has her recent lab results from [facility] on her phone and reports her UDS was negative for buprenorphine, but explained that there was a lapse in her prescription and she had run out the week before her UDS was collected. We are requesting records but I encouraged her to keep her schedule follow up appointment next week to explain this to the provider. Will follow up.  ? ?Patient also requested a letter exempting her from jury duty. Letter provided to patient, see letters tab.  ?

## 2021-05-23 NOTE — Assessment & Plan Note (Signed)
Chronic and well-controlled on Ozempic 1 mg subcutaneous weekly.  She has lost a total of 20 pounds since we started this medication last year.  Hemoglobin A1c today is 5.5.  She is tolerating Ozempic well without side effects.  Discussed further up titration of this medication for maximal weight loss effect.  Patient is agreeable. ?- Increase Ozempic to 2 mg subcutaneous weekly ?- Repeat hemoglobin A1c in 6 months ?

## 2021-05-23 NOTE — Assessment & Plan Note (Signed)
Patient recently had lab work with [facility] for pain management.  Review of these results on her phone she had a lipid panel with total cholesterol 98 and LDL 41 at goal.  Continue atorvastatin 40 mg daily for secondary prevention in the setting of prior cerebellar strokes. ?

## 2021-05-23 NOTE — Telephone Encounter (Signed)
Ok. I think she has a visit with me this morning. Can we request records from [facility] and [facility]?  ?

## 2021-05-23 NOTE — Assessment & Plan Note (Signed)
Recently checked at [facility] medical, results at 96. Monitor.  ?

## 2021-05-23 NOTE — Patient Instructions (Signed)
Ms. Staunton, ? ?It was a pleasure to see you today. Your blood counts were low on your recent lab check. I am checking iron studies today and will call you with the results. Please follow up with your OBGYN for your vaginal bleeding. ? ?I have requested records from your pain clinic.  ? ?For your knee, I have placed an order for home physical therapy. You should be called to schedule this.  ? ?Follow up with me again in 3 months or sooner if you have any problems.  ? ?If you have any questions or concerns, call our clinic at [phone] or after hours call [phone] and ask for the internal medicine resident on call.  ? ?Thank you! ? ?Dr. Darnell Level ?

## 2021-05-23 NOTE — Assessment & Plan Note (Signed)
Patient has a history of IDA and has been off her iron supplementation since November.  She continues to have abnormal uterine bleeding and is taking Megace as prescribed by her gynecologist.  She also has a Mirena IUD in place.  She recently had labs checked with [facility] and I was able to review these results on her phone.  CBC showed a hemoglobin of 11, MCV of 97, and platelets of 447.  Overall stable from her labs last checked in our system 7 months ago.  I am checking an iron panel today and will restart iron supplementation if needed.  Encouraged her to follow-up with her OB/GYN for her uterine bleeding. ?

## 2021-05-24 ENCOUNTER — Other Ambulatory Visit: Payer: Self-pay | Admitting: Internal Medicine

## 2021-05-24 ENCOUNTER — Ambulatory Visit: Payer: Medicare Other | Admitting: Licensed Clinical Social Worker

## 2021-05-24 DIAGNOSIS — E611 Iron deficiency: Secondary | ICD-10-CM

## 2021-05-24 LAB — IRON,TIBC AND FERRITIN PANEL
Ferritin: 141 ng/mL (ref 15–150)
Iron Saturation: 12 % — ABNORMAL LOW (ref 15–55)
Iron: 28 ug/dL (ref 27–159)
Total Iron Binding Capacity: 243 ug/dL — ABNORMAL LOW (ref 250–450)
UIBC: 215 ug/dL (ref 131–425)

## 2021-05-24 MED ORDER — FERROUS SULFATE 325 (65 FE) MG PO TBEC: 325.0000 mg | DELAYED_RELEASE_TABLET | Freq: Every day | ORAL | 1 refills | Status: DC

## 2021-05-24 NOTE — Chronic Care Management (AMB) (Signed)
?  Care Management  ? ?Social Work Visit Note ? ?05/24/2021 ?Name: Lindsay [name] MRN: [id number] DOB: 28-Oct-1970 ? ?Lindsay [name] is a 51 y.o. year old female who sees Velna Ochs, MD for primary care. The care management team was consulted for assistance with care management and care coordination needs related to  Initial visit.   ? ?Patient was given the following information about care management and care coordination services today, agreed to services, and gave verbal consent: 1.care management/care coordination services include personalized support from designated clinical staff supervised by their physician, including individualized plan of care and coordination with other care providers 2. 24/7 contact phone numbers for assistance for urgent and routine care needs. 3. The patient may stop care management/care coordination services at any time by phone call to the office staff. ? ?Engaged with patient by telephone for initial visit in response to provider referral for social work chronic care management and care coordination services. ? ?Assessment: Review of patient history, allergies, and health status during evaluation of patient need for care management/care coordination services.   ? ?Interventions:  ?Patient interviewed and appropriate assessments performed ?Collaborated with clinical team regarding patient needs  ?Patient is requesting a CAP referral. SW requested form to be placed in PCP box. When received back, SW will complete form and fax to [facility] Medicaid. [email or web address] ?Patient advised she receives $204.00 in [employer] benefits. Patient interested in [employer]. SW will place referral.  ?Patient currently has an PCS Aid who assist with picking up groceries.  ?Patient has reached out to church for further assistance for food. ( [place]). Patient is waiting on a response.  ?Patient has a limited support system.   ?Patient aware she can reach SW via [place]. ? ? ?SDOH (Social Determinants of Health) assessments performed: Yes ?   ? ?Plan:  ?SW will fax and complete CAP application with the next 14 days.  ?SW will follow up with patient once CAP application is received and faxed.  ? ?Milus Height, BSW, MSW  ?Social Worker ?[facility]  ?[phone] ?  ? ? ? ? ? ? ? ? ? ? ? ? ? ? ?

## 2021-05-24 NOTE — Patient Instructions (Signed)
Visit Information ? ?Instructions: patient will work with SW to address concerns related to CAP services ? ?Patient was given the following information about care management and care coordination services today, agreed to services, and gave verbal consent: 1.care management/care coordination services include personalized support from designated clinical staff supervised by their physician, including individualized plan of care and coordination with other care providers 2. 24/7 contact phone numbers for assistance for urgent and routine care needs. 3. The patient may stop care management/care coordination services at any time by phone call to the office staff. ? ?Patient verbalizes understanding of instructions and care plan provided today and agrees to view in [place]. Active MyChart status confirmed with patient.   ? ?The care management team will reach out to the patient again over the next 30 days.  ? ?Milus Height, BSW , MSW ?Social Worker ?[facility]  ?[phone] ?  ? ?  ?

## 2021-05-25 ENCOUNTER — Other Ambulatory Visit: Payer: Self-pay | Admitting: Internal Medicine

## 2021-05-25 DIAGNOSIS — G4733 Obstructive sleep apnea (adult) (pediatric): Secondary | ICD-10-CM | POA: Diagnosis not present

## 2021-05-25 DIAGNOSIS — K219 Gastro-esophageal reflux disease without esophagitis: Secondary | ICD-10-CM

## 2021-05-29 ENCOUNTER — Telehealth: Payer: Self-pay | Admitting: *Deleted

## 2021-05-29 DIAGNOSIS — R42 Dizziness and giddiness: Secondary | ICD-10-CM

## 2021-05-29 NOTE — Telephone Encounter (Signed)
-----   Message from Velna Ochs, MD sent at 05/24/2021  1:33 PM EDT ----- ?Attempted to call patient, no answer. Sent my chart message. Iron studies appear mixed IDA / anemia of chronic disease. TIBC is low and ferritin >100 but iron sat is borderline low. Given on going vaginal bleeding, will ask patient to resume iron supplementation and monitor. Repeat iron studies at follow up.  ?

## 2021-05-29 NOTE — Telephone Encounter (Signed)
Pt stated she received Dr Rivka Safer message and picked up iron pills. Also stated she needs a refill on Meclizine. ?

## 2021-05-29 NOTE — Telephone Encounter (Signed)
Called pt as a f/u to Dr [employer] message - no answer; left message to call the office back. ?

## 2021-05-30 DIAGNOSIS — G4733 Obstructive sleep apnea (adult) (pediatric): Secondary | ICD-10-CM | POA: Diagnosis not present

## 2021-05-30 MED ORDER — MECLIZINE HCL 25 MG PO TABS: ORAL_TABLET | ORAL | 2 refills | Status: DC

## 2021-06-01 ENCOUNTER — Ambulatory Visit (INDEPENDENT_AMBULATORY_CARE_PROVIDER_SITE_OTHER): Payer: Medicare Other | Admitting: Licensed Clinical Social Worker

## 2021-06-01 DIAGNOSIS — F331 Major depressive disorder, recurrent, moderate: Secondary | ICD-10-CM

## 2021-06-01 DIAGNOSIS — F4312 Post-traumatic stress disorder, chronic: Secondary | ICD-10-CM | POA: Diagnosis not present

## 2021-06-01 DIAGNOSIS — F410 Panic disorder [episodic paroxysmal anxiety] without agoraphobia: Secondary | ICD-10-CM

## 2021-06-01 DIAGNOSIS — F411 Generalized anxiety disorder: Secondary | ICD-10-CM
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
## 2021-06-03 ENCOUNTER — Other Ambulatory Visit (HOSPITAL_COMMUNITY): Payer: Self-pay | Admitting: Psychiatry

## 2021-06-03 DIAGNOSIS — F331 Major depressive disorder, recurrent, moderate: Secondary | ICD-10-CM

## 2021-06-04 DIAGNOSIS — I10 Essential (primary) hypertension: Secondary | ICD-10-CM | POA: Diagnosis not present

## 2021-06-04 DIAGNOSIS — E785 Hyperlipidemia, unspecified: Secondary | ICD-10-CM | POA: Diagnosis not present

## 2021-06-04 DIAGNOSIS — E119 Type 2 diabetes mellitus without complications: Secondary | ICD-10-CM | POA: Diagnosis not present

## 2021-06-04 DIAGNOSIS — I5042 Chronic combined systolic (congestive) and diastolic (congestive) heart failure: Secondary | ICD-10-CM | POA: Diagnosis not present

## 2021-06-05 DIAGNOSIS — J9620 Acute and chronic respiratory failure, unspecified whether with hypoxia or hypercapnia: Secondary | ICD-10-CM | POA: Diagnosis not present

## 2021-06-08 ENCOUNTER — Encounter (HOSPITAL_COMMUNITY): Payer: Self-pay | Admitting: Psychiatry

## 2021-06-08 ENCOUNTER — Telehealth (HOSPITAL_BASED_OUTPATIENT_CLINIC_OR_DEPARTMENT_OTHER): Payer: Medicare Other | Admitting: Psychiatry

## 2021-06-08 DIAGNOSIS — F4312 Post-traumatic stress disorder, chronic: Secondary | ICD-10-CM

## 2021-06-08 DIAGNOSIS — F410 Panic disorder [episodic paroxysmal anxiety] without agoraphobia: Secondary | ICD-10-CM | POA: Diagnosis not present

## 2021-06-08 DIAGNOSIS — F331 Major depressive disorder, recurrent, moderate: Secondary | ICD-10-CM | POA: Diagnosis not present

## 2021-06-08 MED ORDER — REXULTI 3 MG PO TABS: 3.0000 mg | ORAL_TABLET | Freq: Every day | ORAL | 0 refills | Status: DC

## 2021-06-08 MED ORDER — MIRTAZAPINE 30 MG PO TABS: 30.0000 mg | ORAL_TABLET | Freq: Every day | ORAL | 0 refills | Status: DC

## 2021-06-08 MED ORDER — LAMOTRIGINE 150 MG PO TABS: 150.0000 mg | ORAL_TABLET | Freq: Every day | ORAL | 0 refills | Status: DC

## 2021-06-08 MED ORDER — CLORAZEPATE DIPOTASSIUM 3.75 MG PO TABS: 3.7500 mg | ORAL_TABLET | Freq: Every day | ORAL | 2 refills | Status: DC
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
## 2021-06-11 ENCOUNTER — Other Ambulatory Visit: Payer: Self-pay | Admitting: Internal Medicine

## 2021-06-11 ENCOUNTER — Ambulatory Visit (INDEPENDENT_AMBULATORY_CARE_PROVIDER_SITE_OTHER): Payer: Medicare Other | Admitting: Internal Medicine

## 2021-06-11 DIAGNOSIS — M79605 Pain in left leg: Secondary | ICD-10-CM | POA: Diagnosis not present

## 2021-06-11 DIAGNOSIS — M549 Dorsalgia, unspecified: Secondary | ICD-10-CM | POA: Diagnosis not present

## 2021-06-11 DIAGNOSIS — G8929 Other chronic pain: Secondary | ICD-10-CM | POA: Diagnosis not present

## 2021-06-11 DIAGNOSIS — M79604 Pain in right leg: Secondary | ICD-10-CM

## 2021-06-11 DIAGNOSIS — E1121 Type 2 diabetes mellitus with diabetic nephropathy: Secondary | ICD-10-CM | POA: Diagnosis not present

## 2021-06-11 MED ORDER — TIZANIDINE HCL 4 MG PO TABS: 4.0000 mg | ORAL_TABLET | Freq: Three times a day (TID) | ORAL | 0 refills | Status: DC | PRN

## 2021-06-11 NOTE — Progress Notes (Signed)
?  [facility] Telephone Encounter ?Continuity Care Appointment ? ?HPI:  ?This telephone encounter was created for [name] on 06/11/2021 for the following purpose/cc back pain.  ? ?Past Medical History:  ?Past Medical History:  ?Diagnosis Date  ? Acid reflux   ? Anemia   ? Iron Def  ? Anorexia   ? CHF (congestive heart failure) ([place])   ? Chronic kidney disease   ? Nephrotic syndrome  ? Colon polyp 2009  ? Depression with anxiety   ? Edema leg   ? Fibromyalgia   ? Hemorrhoids   ? Hidradenitis suppurativa   ? Hypertension   ? IBS (irritable bowel syndrome)   ? Low back pain   ? Migraines   ? Morbidly obese ([place])   ? Neuromuscular disorder ([place])   ? fibromyalgia  ? Neuropathy   ? Panic attacks   ? Polyp of vocal cord or larynx   ? Tonsil pain   ?  ? ?ROS:  ?Positive for back pain, hip pain, leg pain, negative for SHOB, chest pain  ? ?Assessment / Plan / Recommendations:  ?Please see A&P under problem oriented charting for assessment of the patient's acute and chronic medical conditions.  ?As always, pt is advised that if symptoms worsen or new symptoms arise, they should go to an urgent care facility or to to ER for further evaluation.  ? ?Consent and Medical Decision Making:  ?Patient discussed with Dr. Philipp Ovens ?This is a telephone encounter between Lindsay [name] and [place] on 06/11/2021 for back pain. The visit was conducted with the patient located at home and [facility]. The patient's identity was confirmed using their DOB and current address. The patient has consented to being evaluated through a telephone encounter and understands the associated risks (an examination cannot be done and the patient may need to come in for an appointment) / benefits (allows the patient to remain at home, decreasing exposure to coronavirus). I personally spent 10 minutes on medical discussion.   ? ? ?

## 2021-06-11 NOTE — Assessment & Plan Note (Signed)
Patient complaining of a back pain flare. She says that she has taken a muscle relaxer in the past which has really helped with her pain. Discussed with patient that we could do a short course of this to see if we could get her through her flare and then see how she is doing in clinic in a month or so.  ?- tizanidine '4mg'$  TID PRN ?

## 2021-06-11 NOTE — Assessment & Plan Note (Signed)
Has not yet seen eye doctor this year due to transportation difficulties ?

## 2021-06-11 NOTE — Assessment & Plan Note (Signed)
History of torn meniscus, patient was referred to [facility] PT at last visit but has not heard back yet. Discussed with office staff who will follow up on referral. ?

## 2021-06-12 NOTE — Progress Notes (Signed)
Internal Medicine Clinic Attending ° °Case discussed with Dr. DeMaio  At the time of the visit.  We reviewed the resident’s history and exam and pertinent patient test results.  I agree with the assessment, diagnosis, and plan of care documented in the resident’s note. ° ° °

## 2021-06-15 ENCOUNTER — Ambulatory Visit (INDEPENDENT_AMBULATORY_CARE_PROVIDER_SITE_OTHER): Payer: Medicare Other | Admitting: Licensed Clinical Social Worker

## 2021-06-15 DIAGNOSIS — F331 Major depressive disorder, recurrent, moderate: Secondary | ICD-10-CM

## 2021-06-15 DIAGNOSIS — F411 Generalized anxiety disorder: Secondary | ICD-10-CM | POA: Diagnosis not present

## 2021-06-15 DIAGNOSIS — F410 Panic disorder [episodic paroxysmal anxiety] without agoraphobia: Secondary | ICD-10-CM | POA: Diagnosis not present

## 2021-06-15 DIAGNOSIS — F4312 Post-traumatic stress disorder, chronic: Secondary | ICD-10-CM

## 2021-06-15 NOTE — Progress Notes (Signed)
Virtual Visit via Video Note  I connected with Lindsay [name] on 06/15/21 at  8:00 AM EDT by a video enabled telemedicine application and verified that I am speaking with the correct person using two identifiers.  Location: Patient: home Provider: home office   I discussed the limitations of evaluation and management by telemedicine and the availability of in person appointments. The patient expressed understanding and agreed to proceed.   I discussed the assessment and treatment plan with the patient. The patient was provided an opportunity to ask questions and all were answered. The patient agreed with the plan and demonstrated an understanding of the instructions.   The patient was advised to call back or seek an in-person evaluation if the symptoms worsen or if the condition fails to improve as anticipated.  I provided 30 minutes of non-face-to-face time during this encounter.  THERAPIST PROGRESS NOTE  Session Time: 8:00 AM to 8:30 AM  Participation Level: Active  Behavioral Response: CasualAlertDepressed  Type of Therapy: Individual Therapy  Treatment Goals addressed: Patient continues to find therapy helpful for supportive and strength-based interventions as well as continue to focus on seeking help and coping with medical issues, anxiety, triggers, coping, utilize therapy as a way for patient to focus on working through current stressors that also helps to distract from pain  ProgressTowards Goals: Progressing-therapist assesses helpful for patient to process feelings, therapist provided support and space for patient as she talks about thoughts and feelings in session  Interventions: Solution Focused, Strength-based, Supportive, and Other: Coping  Summary: Lindsay [name] is a 51 y.o. female who presents with patient says has Salmonella poisoning diagnosed last night have [facility] coming to the house which insurance uses for urgent care. Bad diarrhea and medication Imodium A-D  not doing anything. Throw up and stomach pain like being punched in the gut sharp achy pains. On muscle relaxant can keep anything down and want her to so she doesn't get dehydrated.  Burping smells like rotten egg. Ate yesterday at noon chicken from [facility]  where she got sick by 7 PM started to get symptoms.  Therapist stepped back to take an overview of situation and patient says she doesn't think anything going in the right direction. Muscle relaxer makes her stiff and painful and doctor said flare up of muscle spasms being really stiff. Got to get x-ray done to go back to place don't want to go because he said can't do anything for her. Patient trying to get orders right so that when she goes there won't be a problem and see written in a way where there could be a problem so I am to get a hold of them but they are not responding so she is doing all this leg work to get her testing done. [facility] did an MRI and torn mensicus dealing with this since last December. Throwing up everything eating.  Therapist explored who is coming over and patient says a doctor and nurse clinician patient  qualifies for this special team. Patient said she thought Best sign up if something happens doesn't have go to emergency room. Last time waited 27 hours at ER. At the time was having chest pain.  Therapist was hopeful with this news thinking she may get more medical attention and have her needs addressed in a better way.  Therapist processed patient's feelings related to significant ongoing stressors therapist both validated patient on her struggles that have been ongoing but also pointing out some hopeful possibilities such as  a team coming to her house that patient has medical issues so this can be helpful to get this direct care also may have a team that may intervene better for her.  Noted at the same time frustrations of navigating medical system with all the roadblocks trying to get basic things done such as getting  orders for testing that are written the right way so that they will get done.  Noted patient being sick but her thoughtfulness both and staying on this session even though she is sick is recalling not telling family all that is going on she does not want to worry them.  Noted another positive is getting muscle relaxant although patient says has not been able to hold it down hopefully will change when she gets some relief from most recent episode of Salmonella poisoning therapist assesses this helpful to have her follow-up patient's already someone who can validate her experiences know the things that she has been through how she continues to advocate for care therapist providing strength-based and supportive intervention.  Therapist provided support and space for patient as she shared her thoughts and feelings in session. Suicidal/Homicidal: No  Plan: Return again in 2 weeks.2Find topics that help with mood and are distracting from patient's pain, continue to provide supportive as well as problem solving strategies to help patient with frustrations and roadblocks to medical care  Diagnosis: Major depressive disorder, recurrent, moderate, generalized anxiety disorder, chronic PTSD, panic attacks  Collaboration of Care: Primary Care Provider AEB review of Dr. Ileene Musa note 06/11/21  Patient/Guardian was advised Release of Information must be obtained prior to any record release in order to collaborate their care with an outside provider. Patient/Guardian was advised if they have not already done so to contact the registration department to sign all necessary forms in order for [name] to release information regarding their care.   Consent: Patient/Guardian gives verbal consent for treatment and assignment of benefits for services provided during this visit. Patient/Guardian expressed understanding and agreed to proceed.   [name], LCSW 06/15/2021

## 2021-06-20 ENCOUNTER — Ambulatory Visit: Payer: Medicare Other

## 2021-06-20 ENCOUNTER — Other Ambulatory Visit: Payer: Self-pay

## 2021-06-20 DIAGNOSIS — G8929 Other chronic pain: Secondary | ICD-10-CM

## 2021-06-20 DIAGNOSIS — M79604 Pain in right leg: Secondary | ICD-10-CM

## 2021-06-20 DIAGNOSIS — E1121 Type 2 diabetes mellitus with diabetic nephropathy: Secondary | ICD-10-CM

## 2021-06-20 MED ORDER — ONDANSETRON 4 MG PO TBDP: 4.0000 mg | ORAL_TABLET | Freq: Four times a day (QID) | ORAL | 0 refills | Status: DC | PRN

## 2021-06-20 MED ORDER — TIZANIDINE HCL 4 MG PO TABS: 4.0000 mg | ORAL_TABLET | Freq: Three times a day (TID) | ORAL | 1 refills | Status: DC | PRN

## 2021-06-20 NOTE — Chronic Care Management (AMB) (Addendum)
Care Management    RN Visit Note  06/20/2021 Name: Lindsay [name] MRN: [id number] DOB: 24-Dec-1970  Subjective: Lindsay [name] is a 51 y.o. year old female who is a primary care patient of Velna Ochs, MD. The care management team was consulted for assistance with disease management and care coordination needs.  Case to be closed per supervisor instruction.  Engaged with patient by telephone for follow up visit in response to provider referral for case management and/or care coordination services.   Consent to Services:   Lindsay [name] was given information about Care Management services today including:  Care Management services includes personalized support from designated clinical staff supervised by her physician, including individualized plan of care and coordination with other care providers 24/7 contact phone numbers for assistance for urgent and routine care needs. The patient may stop case management services at any time by phone call to the office staff.  Patient agreed to services and consent obtained.   Assessment: Review of patient past medical history, allergies, medications, health status, including review of consultants reports, laboratory and other test data, was performed as part of comprehensive evaluation and provision of chronic care management services.   SDOH (Social Determinants of Health) assessments and interventions performed:    Care Plan  Allergies  Allergen Reactions   Lisinopril Rash and Cough    Outpatient Encounter Medications as of 06/20/2021  Medication Sig   ACCU-CHEK GUIDE test strip USE ONE TEST STRIP TO CHECK BLOOD SUGARS ONCE A DAY AS NEEDED   Accu-Chek Softclix Lancets lancets USE ONE LANCET TO CHECK BLOOD SUGAR ONE A DAY AS NEEDED   ADDYI 100 MG TABS TAKE 1 TABLET BY MOUTH EVERY DAY   albuterol (PROVENTIL) (2.5 MG/3ML) 0.083% nebulizer solution Take 3 mLs (2.5 mg total) by nebulization every 4 (four) hours as needed for wheezing or shortness  of breath.   albuterol (VENTOLIN HFA) 108 (90 Base) MCG/ACT inhaler TAKE 2 PUFFS BY MOUTH EVERY 6 HOURS AS NEEDED FOR WHEEZE OR SHORTNESS OF BREATH   Ascorbic Acid (VITAMIN C PO) Take 1 tablet by mouth daily.   atorvastatin (LIPITOR) 40 MG tablet Take 1 tablet (40 mg total) by mouth daily.   Blood Glucose Monitoring Suppl (ACCU-CHEK GUIDE ME) w/Device KIT Dispense one device   Brexpiprazole (REXULTI) 3 MG TABS Take 1 tablet (3 mg total) by mouth daily.   buprenorphine (BUTRANS) 20 MCG/HR PTWK 1 patch once a week.   buprenorphine (BUTRANS) 5 MCG/HR PTWK APPLY 1 PATCH EVERY WEEK (Patient not taking: Reported on 05/09/2021)   Capsaicin 0.025 % PTCH Apply 1 patch topically daily as needed.   cetirizine (ZYRTEC) 10 MG tablet TAKE 1 TABLET BY MOUTH EVERY DAY   cholecalciferol (VITAMIN D) 25 MCG (1000 UNIT) tablet TAKE 1 TABLET BY MOUTH EVERY DAY   clindamycin (CLINDAGEL) 1 % gel APPLY TO AFFECTED AREA TWICE A DAY   clorazepate (TRANXENE) 3.75 MG tablet Take 1 tablet (3.75 mg total) by mouth daily.   diclofenac Sodium (VOLTAREN) 1 % GEL APPLY 2 GRAMS TOPICALLY 4 (FOUR) TIMES DAILY AS NEEDED FOR KNEE PAIN   doxycycline (VIBRA-TABS) 100 MG tablet Take 1 tablet (100 mg total) by mouth 2 (two) times daily. (Patient not taking: Reported on 05/09/2021)   ENTRESTO 49-51 MG Take 1 tablet by mouth 2 (two) times daily.   famotidine (PEPCID) 20 MG tablet TAKE 1 TABLET BY MOUTH EVERY DAY   ferrous sulfate 325 (65 FE) MG EC tablet Take 1 tablet (325 mg total)  by mouth daily with breakfast.   fluconazole (DIFLUCAN) 150 MG tablet Please take 1 tablet by mouth. Repeat in 3 days if symptoms persist. (Patient not taking: Reported on 05/09/2021)   gabapentin (NEURONTIN) 300 MG capsule Take 3 capsules (900 mg total) by mouth at bedtime. (Patient not taking: Reported on 10/16/2020)   ipratropium (ATROVENT) 0.06 % nasal spray PLEASE SEE ATTACHED FOR DETAILED DIRECTIONS   lamoTRIgine (LAMICTAL) 150 MG tablet Take 1 tablet (150  mg total) by mouth daily.   lidocaine (LIDODERM) 5 % Place 2 patches onto the skin daily. Remove & Discard patch within 12 hours or as directed by MD   lidocaine (XYLOCAINE) 5 % ointment Apply 1 application topically as needed for mild pain.   meclizine (ANTIVERT) 25 MG tablet TAKE 1 TABLET BY MOUTH 3 TIMES A DAY AS NEEDED FOR DIZZINESS   megestrol (MEGACE) 40 MG tablet Take 2 tablets (80 mg total) by mouth daily.   metFORMIN (GLUCOPHAGE-XR) 500 MG 24 hr tablet TAKE 2 TABLETS BY MOUTH EVERY DAY WITH BREAKFAST (Patient not taking: Reported on 05/09/2021)   metoprolol succinate (TOPROL-XL) 50 MG 24 hr tablet Take 1 tablet (50 mg total) by mouth daily.   mirtazapine (REMERON) 30 MG tablet Take 1 tablet (30 mg total) by mouth at bedtime.   omeprazole (PRILOSEC) 40 MG capsule TAKE 1 CAPSULE (40 MG TOTAL) BY MOUTH DAILY.   ondansetron (ZOFRAN-ODT) 4 MG disintegrating tablet Take 4 mg by mouth every 6 (six) hours as needed.   promethazine (PHENERGAN) 12.5 MG tablet TAKE 1 TABLET BY MOUTH EVERY 6 HOURS AS NEEDED   Semaglutide, 2 MG/DOSE, (OZEMPIC, 2 MG/DOSE,) 8 MG/3ML SOPN Inject 2 mg into the skin once a week.   SYMBICORT 160-4.5 MCG/ACT inhaler INHALE 2 PUFFS INTO THE LUNGS IN THE MORNING AND AT BEDTIME.   tiZANidine (ZANAFLEX) 4 MG tablet Take 1 tablet (4 mg total) by mouth 3 (three) times daily as needed.   torsemide (DEMADEX) 20 MG tablet TAKE 2 TABLETS BY MOUTH TWICE A DAY   vitamin B-12 (CYANOCOBALAMIN) 1000 MCG tablet Take 1 tablet (1,000 mcg total) by mouth daily.   Vitamin E 400 units TABS Take 400 Units by mouth daily.    No facility-administered encounter medications on file as of 06/20/2021.    Patient Active Problem List   Diagnosis Date Noted   History of torn meniscus of left knee 05/23/2021   History of CVA (cerebrovascular accident) 05/23/2021   Amenorrhea 11/08/2020   Memory problem 11/08/2020   Onychomycosis 11/08/2020   Hypoactive sexual desire disorder due to medical condition  in female 10/16/2020   Hospital discharge follow-up 10/09/2020   Diabetes ([place]) 02/03/2020   HLD (hyperlipidemia) 02/03/2020   Dizziness 02/02/2020   Pain in both knees 12/09/2019   Chronic heart failure ([place]) 11/25/2019   OSA (obstructive sleep apnea) 06/14/2019   Right shoulder pain 04/21/2019   Gum lesion 03/15/2019   Compression fracture of body of thoracic vertebra ([place]) 03/08/2019   Iron deficiency 02/10/2019   History of colon polyps    Chronic back pain 12/05/2017   Chronic respiratory failure with hypoxia ([place])    Vitamin B12 deficiency 05/02/2017   Palpitations 04/11/2017   Vitamin D deficiency, unspecified 06/12/2016   Nicotine dependence 06/12/2016   Migraines 01/08/2016   Leg pain, bilateral 11/29/2015   Fibromyalgia 07/05/2015   Chronic leg pain 09/26/2014   Healthcare maintenance 09/26/2014   Morbid obesity ([place]) 07/04/2014   Major depressive disorder, recurrent episode, moderate ([place]) 10/13/2012  Generalized anxiety disorder 10/13/2012   Vaginal candidiasis 11/27/2010   Chronic nausea 05/10/2010   IBS (irritable bowel syndrome) 06/16/2007   Essential hypertension 02/06/2006   Hidradenitis 11/18/2005    Conditions to be addressed/monitored: HTN, DMII, and Fibromyalgia with chronic pain  Care Plan : RN Care Manager Plan of Care  Updates made by Johnney Killian, RN since 06/20/2021 12:00 AM     Problem: Health Promotion or Disease Self-Management (General Plan of Care)      Long-Range Goal: Chronic Disease Management and Care Coordination Needs (HTN, DM2, Fibromyalgia, Chronic Pain)   Start Date: 05/09/2021  Recent Progress: Not on track  Priority: High  Note:   Current Barriers: Successful initial outreach to patient this morning.  We discussed her appointment at the [facility] clinic.  She shared that she was sent for x-rays of her spine and when she went to get the xray at [facility], she was told she is too heavy for their x-ray machine.  She  was told to bring the order to [facility] and she could have the xray done here, however, the order cannot be completed at [facility].  Patient tried to get in touch with someone at the [facility]. To find out what the next step is and she is not getting a response from them.  Patient also had been going to the [facility] center and they told her they could not see her anymore because she missed an appointment.  Patient is not sure what to do and is wondering if there is another pain clinic she can go to.  Will forward to PCP to see if she can refer to another pain clinic.   CBG's- patient notes her fasting readings for the past couple of days were 70 and 85. Medications; Patient is requesting more muscle relaxer as she starts [facility] soon.  She is also requesting Zofran for nausea related to her having Salmonella poisoning from undercooked chicken.  Patient was seen by [facility], through [facility] at her home and was prescribed Zofran. Knowledge Deficits related to plan of care for management of HTN, DMII, Anxiety, Depression, and Fibromyalgia  Care Coordination needs related to Lacks knowledge of community resource: Needs assistance obtaining CAP services. Chronic Disease Management support and education needs related to HTN, DMII, Anxiety, Depression, and Fibromyalgia  RNCM Clinical Goal(s):  Patient will verbalize basic understanding of  HTN, DMII, Anxiety, Depression, and Fibromyalgia disease process and self health management plan as evidenced by discussions with provider and RNCM. continue to work with RN Care Manager to address care management and care coordination needs related to  HTN, DMII, Anxiety, Depression, and Fibromyalgia as evidenced by adherence to CM Team Scheduled appointments work with Education officer, museum to address  related to the management of Level of care concerns and Limited access to caregiver related to the management of HTN, DMII, Anxiety, Depression, and Fibromyalgia as evidenced by  review of EMR and patient or social worker report not experience hospital admission as evidenced by review of EMR. Hospital Admissions in last 6 months = 0  through collaboration with RN Care manager, provider, and care team.  Interventions: 1:1 collaboration with primary care provider regarding development and update of comprehensive plan of care as evidenced by provider attestation and co-signature Inter-disciplinary care team collaboration (see longitudinal plan of care) Evaluation of current treatment plan related to  self management and patient's adherence to plan as established by provider Diabetes Interventions:  (Status:  Goal on  track:  Yes.) Long Term Goal Assessed patient's understanding of A1c goal: <7%-  Reviewed medications with patient and discussed importance of medication adherence Counseled on importance of regular laboratory monitoring as prescribed Discussed plans with patient for ongoing care management follow up and provided patient with direct contact information for care management team Review of patient status, including review of consultants reports, relevant laboratory and other test results, and medications completed Lab Results  Component Value Date   HGBA1C 5.5 05/23/2021  Hypertension Interventions:  (Status:  Goal on track:  Yes.) Long Term Goal Last practice recorded BP readings:  BP Readings from Last 3 Encounters:  05/23/21 113/63  11/08/20 110/70  10/07/20 128/85  Most recent eGFR/CrCl: No results found for: EGFR  No components found for: CRCL Evaluation of current treatment plan related to hypertension self management and patient's adherence to plan as established by provider Reviewed medications with patient and discussed importance of compliance Discussed plans with patient for ongoing care management follow up and provided patient with direct contact information for care management team Long Term Follow Up: (Status:  Goal on track:  Yes.)  Long Term  Goal The patient has demonstrated independence in self care management of HTN, DMII, and Major Depression and Fibromyalgia  including adherence to treatment plan as established by the provider and care team and collaboratively established goals have been met. The patient wishes to remain enrolled in care management/care coordination services and requests ongoing follow up for assistance with long term health maintenance.  Ensured that patient has knowledge of upcoming scheduled provider appointments.  Pain Interventions:  (Status:  Goal on track:  NO.) Long Term Goal Pain assessment performed Medications reviewed Reviewed provider established plan for pain management Discussed importance of adherence to all scheduled medical appointments Counseled on the importance of reporting any/all new or changed pain symptoms or management strategies to pain management provider Reviewed with patient prescribed pharmacological and nonpharmacological pain relief strategies Patient Goals/Self-Care Activities: Take all medications as prescribed Attend all scheduled provider appointments Call pharmacy for medication refills 3-7 days in advance of running out of medications Call provider office for new concerns or questions  Work with the social worker to address care coordination needs and will continue to work with the clinical team to address health care and disease management related needs  Follow Up Plan:  The patient has been provided with contact information for the care management team and has been advised to call with any health related questions or concerns.       Plan: The patient has been provided with contact information for the care management team and has been advised to call with any health related questions or concerns.   Johnney Killian, RN, BSN, CCM Care Management Coordinator [facility] Internal Medicine Phone: [phone]: [phone]

## 2021-06-20 NOTE — Addendum Note (Signed)
Addended by: Jodean Lima on: 06/20/2021 11:30 AM   Modules accepted: Orders

## 2021-06-20 NOTE — Addendum Note (Signed)
Addended by: Velora Heckler on: 06/20/2021 09:51 AM   Modules accepted: Orders

## 2021-06-20 NOTE — Telephone Encounter (Signed)
  Chronic Care Management   Note  06/20/2021 Name: Mayre Bury MRN: [id number] DOB: February 15, 1970  Patient requesting refill of her Zanaflex '4mg'$  tab.  She received 30 on 06/11/21 and states she is starting PT soon and will need more medication.  Patient also noted that she got Salmonella poisoning last week and was seen by [facility] (through [employer]) who gave her a prescription for Zofran '4mg'$  for nausea and she would like a refill on that as well, if possible. Johnney Killian, RN, BSN, CCM Care Management Coordinator [facility] Phone: [phone]: [phone]

## 2021-06-20 NOTE — Patient Instructions (Signed)
Visit Information  Thank you for taking time to visit with me today. Please don't hesitate to contact me if I can be of assistance to you before our next scheduled telephone appointment.  Our next appointment is by telephone on 08/15/21 at 0930.  Please call the care guide team at [phone] if you need to cancel or reschedule your appointment.   If you are experiencing a Mental Health or [place] or need someone to talk to, please call the [place] National Suicide Prevention Lifeline: 805-038-2556 or TTY: [phone] TTY (307)[phone]) to talk to a trained counselor   Patient verbalizes understanding of instructions and care plan provided today and agrees to view in [place]. Active MyChart status and patient understanding of how to access instructions and care plan via MyChart confirmed with patient.     The patient has been provided with contact information for the care management team and has been advised to call with any health related questions or concerns.   Johnney Killian, RN, BSN, CCM Care Management Coordinator [facility] Internal Medicine Phone: [phone]: [phone]

## 2021-06-21 NOTE — Addendum Note (Signed)
Addended by: Jodean Lima on: 06/21/2021 10:32 AM   Modules accepted: Orders

## 2021-06-21 NOTE — Progress Notes (Signed)
Ok, not sure how many pain clinics she has seen or been referred to in the past but we can try. Might be difficult if she has been dismissed from one. I included [place] as an FYI.

## 2021-06-22 DIAGNOSIS — Z8673 Personal history of transient ischemic attack (TIA), and cerebral infarction without residual deficits: Secondary | ICD-10-CM | POA: Diagnosis not present

## 2021-06-22 DIAGNOSIS — G4733 Obstructive sleep apnea (adult) (pediatric): Secondary | ICD-10-CM | POA: Diagnosis not present

## 2021-06-22 DIAGNOSIS — D631 Anemia in chronic kidney disease: Secondary | ICD-10-CM | POA: Diagnosis not present

## 2021-06-22 DIAGNOSIS — S83242D Other tear of medial meniscus, current injury, left knee, subsequent encounter: Secondary | ICD-10-CM | POA: Diagnosis not present

## 2021-06-22 DIAGNOSIS — K219 Gastro-esophageal reflux disease without esophagitis: Secondary | ICD-10-CM | POA: Diagnosis not present

## 2021-06-22 DIAGNOSIS — I130 Hypertensive heart and chronic kidney disease with heart failure and stage 1 through stage 4 chronic kidney disease, or unspecified chronic kidney disease: Secondary | ICD-10-CM | POA: Diagnosis not present

## 2021-06-22 DIAGNOSIS — G8929 Other chronic pain: Secondary | ICD-10-CM | POA: Diagnosis not present

## 2021-06-22 DIAGNOSIS — K589 Irritable bowel syndrome without diarrhea: Secondary | ICD-10-CM | POA: Diagnosis not present

## 2021-06-22 DIAGNOSIS — I509 Heart failure, unspecified: Secondary | ICD-10-CM | POA: Diagnosis not present

## 2021-06-22 DIAGNOSIS — Z87891 Personal history of nicotine dependence: Secondary | ICD-10-CM | POA: Diagnosis not present

## 2021-06-22 DIAGNOSIS — M545 Low back pain, unspecified: Secondary | ICD-10-CM | POA: Diagnosis not present

## 2021-06-22 DIAGNOSIS — E1140 Type 2 diabetes mellitus with diabetic neuropathy, unspecified: Secondary | ICD-10-CM | POA: Diagnosis not present

## 2021-06-22 DIAGNOSIS — E1122 Type 2 diabetes mellitus with diabetic chronic kidney disease: Secondary | ICD-10-CM | POA: Diagnosis not present

## 2021-06-22 DIAGNOSIS — J9611 Chronic respiratory failure with hypoxia: Secondary | ICD-10-CM | POA: Diagnosis not present

## 2021-06-22 DIAGNOSIS — N189 Chronic kidney disease, unspecified: Secondary | ICD-10-CM | POA: Diagnosis not present

## 2021-06-22 DIAGNOSIS — E559 Vitamin D deficiency, unspecified: Secondary | ICD-10-CM | POA: Diagnosis not present

## 2021-06-25 ENCOUNTER — Other Ambulatory Visit: Payer: Self-pay | Admitting: Internal Medicine

## 2021-06-25 ENCOUNTER — Telehealth: Payer: Self-pay | Admitting: Internal Medicine

## 2021-06-25 MED ORDER — ONDANSETRON 4 MG PO TBDP: 4.0000 mg | ORAL_TABLET | Freq: Four times a day (QID) | ORAL | 0 refills | Status: DC | PRN

## 2021-06-25 NOTE — Telephone Encounter (Signed)
After-Hours Telephone Encounter:   Ms. Capili contacted the after-hours line to request a refill of Zofran. She states she was diagnosed with Salmonella gastroenteritis 8 days ago by [facility]. Her diarrhea has almost completely resolved but she continues to have nausea. She has been taking Zofran every 8 hours with good relief of her symptoms. She is requesting a refill today.   Assessment/Plan:  - Patient states she is able to hold down water. Counseled to monitor closely for dehydration and to go to the ED immediately if she is unable to drink water - Refilled Zofran  - Counseled to contact the clinic tomorrow to schedule a follow up visit this week

## 2021-06-26 ENCOUNTER — Telehealth: Payer: Self-pay

## 2021-06-26 NOTE — Telephone Encounter (Signed)
[facility] was able to take patient.The Start Of Care date was 06-22-2021 this was confirmed by Freddie Breech our [facility] Rep [place], [place] C5/30/20233:45 PM

## 2021-06-27 ENCOUNTER — Encounter: Payer: Self-pay | Admitting: Internal Medicine

## 2021-06-28 ENCOUNTER — Ambulatory Visit (INDEPENDENT_AMBULATORY_CARE_PROVIDER_SITE_OTHER): Payer: Medicare Other | Admitting: Licensed Clinical Social Worker

## 2021-06-28 DIAGNOSIS — N189 Chronic kidney disease, unspecified: Secondary | ICD-10-CM | POA: Diagnosis not present

## 2021-06-28 DIAGNOSIS — M545 Low back pain, unspecified: Secondary | ICD-10-CM | POA: Diagnosis not present

## 2021-06-28 DIAGNOSIS — F410 Panic disorder [episodic paroxysmal anxiety] without agoraphobia: Secondary | ICD-10-CM | POA: Diagnosis not present

## 2021-06-28 DIAGNOSIS — F411 Generalized anxiety disorder: Secondary | ICD-10-CM

## 2021-06-28 DIAGNOSIS — S83242D Other tear of medial meniscus, current injury, left knee, subsequent encounter: Secondary | ICD-10-CM | POA: Diagnosis not present

## 2021-06-28 DIAGNOSIS — Z87891 Personal history of nicotine dependence: Secondary | ICD-10-CM | POA: Diagnosis not present

## 2021-06-28 DIAGNOSIS — K589 Irritable bowel syndrome without diarrhea: Secondary | ICD-10-CM | POA: Diagnosis not present

## 2021-06-28 DIAGNOSIS — I130 Hypertensive heart and chronic kidney disease with heart failure and stage 1 through stage 4 chronic kidney disease, or unspecified chronic kidney disease: Secondary | ICD-10-CM | POA: Diagnosis not present

## 2021-06-28 DIAGNOSIS — F331 Major depressive disorder, recurrent, moderate: Secondary | ICD-10-CM | POA: Diagnosis not present

## 2021-06-28 DIAGNOSIS — F4312 Post-traumatic stress disorder, chronic: Secondary | ICD-10-CM

## 2021-06-28 DIAGNOSIS — I509 Heart failure, unspecified: Secondary | ICD-10-CM | POA: Diagnosis not present

## 2021-06-28 DIAGNOSIS — G8929 Other chronic pain: Secondary | ICD-10-CM | POA: Diagnosis not present

## 2021-06-28 DIAGNOSIS — Z8673 Personal history of transient ischemic attack (TIA), and cerebral infarction without residual deficits: Secondary | ICD-10-CM | POA: Diagnosis not present

## 2021-06-28 DIAGNOSIS — E1122 Type 2 diabetes mellitus with diabetic chronic kidney disease: Secondary | ICD-10-CM | POA: Diagnosis not present

## 2021-06-28 DIAGNOSIS — K219 Gastro-esophageal reflux disease without esophagitis: Secondary | ICD-10-CM | POA: Diagnosis not present

## 2021-06-28 DIAGNOSIS — E559 Vitamin D deficiency, unspecified: Secondary | ICD-10-CM | POA: Diagnosis not present

## 2021-06-28 DIAGNOSIS — E1140 Type 2 diabetes mellitus with diabetic neuropathy, unspecified: Secondary | ICD-10-CM | POA: Diagnosis not present

## 2021-06-28 DIAGNOSIS — J9611 Chronic respiratory failure with hypoxia: Secondary | ICD-10-CM | POA: Diagnosis not present

## 2021-06-28 DIAGNOSIS — G4733 Obstructive sleep apnea (adult) (pediatric): Secondary | ICD-10-CM | POA: Diagnosis not present

## 2021-06-28 DIAGNOSIS — D631 Anemia in chronic kidney disease: Secondary | ICD-10-CM | POA: Diagnosis not present

## 2021-06-28 NOTE — Progress Notes (Signed)
Virtual Visit via Video Note  I connected with Lindsay [name] on 06/28/21 at  8:00 AM EDT by a video enabled telemedicine application and verified that I am speaking with the correct person using two identifiers.  Location: Patient: outside house Provider: home office   I discussed the limitations of evaluation and management by telemedicine and the availability of in person appointments. The patient expressed understanding and agreed to proceed.   I discussed the assessment and treatment plan with the patient. The patient was provided an opportunity to ask questions and all were answered. The patient agreed with the plan and demonstrated an understanding of the instructions.   The patient was advised to call back or seek an in-person evaluation if the symptoms worsen or if the condition fails to improve as anticipated.  I provided 40 minutes of non-face-to-face time during this encounter.  THERAPIST PROGRESS NOTE  Session Time: 8:04 PM to 8:44 PM  Participation Level: Active  Behavioral Response: CasualAlertDepressed  Type of Therapy: Individual Therapy  Treatment Goals addressed: Patient continues to find therapy helpful for supportive and strength-based interventions as well as continue to focus on seeking help and coping with medical issues, anxiety, triggers, coping, utilize therapy as a way for patient to focus on working through current stressors that also helps to distract from pain  ProgressTowards Goals: Progressing-therapy continues to help patient with coping with medical issues providing positive reinforcement for patient continue to be an advocate providing strength-based and supportive interventions  Interventions: Solution Focused, Strength-based, Supportive, and Other: Coping  Summary: Lindsay [name] is a 51 y.o. female who presents with up waiting for physical therapy but hurting bad. Wish she didn't have to physical therapy. Want them to change out the muscle relaxer  doesn't think it is working.  Has a team that comes to see her so we will see if they can come today to do that for her.  Called to make get x-rays order written right and didn't call her back that was 2 weeks ago. Wants blood test for stiff man syndrome. Waiting to hear back from PCP what is next. Knows she is really stiff all over. Symptoms are what she is experiencing. Told PCP a good sport and do what supposed to do but pain and suffering not working for her. Reviewed history with pain clinics and doctors have not helped with pain they don't come up with a plan but patient feels like the run around and sick of it. Therapist said a good pain clinic and patient said something or somebody. Note that a big step forward getting the team to go to her house with her medical issues. So far so good. Wants to ask president of [facility] if anybody can treat for pain, find out what is going on. Doesn't know what else to do don't think transportation will take her to [facility] and if same thing happens she will be angry. Patient asked for physical therapy. Noticed she is outside comes out 1 x a day. Does physical therapy three times a day mostly comes outside to do that. Likes the decaffeinated teas feels calming put lemons and ginger. Feet feels like a lot of needles in feet it trying to move around hips rubbing against the iron chair hurts and makes marks. Feels boxed in with pain issues. Can't get around haven't been to grocery store since July 2021. Only place been is doctor's office. Therapist points out impacts mental health and patient says shuts it down. Reviewed how doing  pretty much the same. July 04 2019 last cooked on grill. Now pain excruciating trying to maintain composure when talking to therapist tor doctor but crying inside like hell. Hard to put on smiling face when feel like this. Therapist pointed out that is the priority. Now sees why people "spaze out when go places" Therapist explored for clarification  when go to provider run around no one helping, say negative stuff.   Noted patient has similar issues provided space and support for patient to talk about thoughts and feelings in session therapist encouraging and providing positive feedback that despite continuing issues and problems with medical system that she continues to be an advocate for herself.  Therapist pointed out her perspective that the roadblocks seems to be involved with pain clinic patient agrees.  Did strategies to help clued putting things back into primary care's hands and if need to talk to people higher up to get help.  Therapist provided validating interventions given how frustrating it is to continue a pain and not have a plan to help with that.  Did patient outside as a positive getting son although it has more to do with having physical therapy another positive thing she advocated for herself.  Noted the positive development of provider coming to her home something she needs and looking for positive things happening in the mail to patient as a positive reframe.  Noted an issue for patient is that she feels boxed in so significant improvement would be being less closed then and that also involves addressing medical issues.  Therapist provided stress management, solution focused CBT interventions Suicidal/Homicidal: No  Plan: Return again in 2  weeks.2.Find topics that help with mood and are distracting from patient's pain, continue to provide supportive as well as problem solving strategies to help patient with frustrations and roadblocks to medical care  Diagnosis: Major depressive disorder, recurrent, moderate, generalized anxiety disorder, chronic PTSD, panic attacks  Collaboration of Care: Other none needed  Patient/Guardian was advised Release of Information must be obtained prior to any record release in order to collaborate their care with an outside provider. Patient/Guardian was advised if they have not already done so to  contact the registration department to sign all necessary forms in order for [name] to release information regarding their care.   Consent: Patient/Guardian gives verbal consent for treatment and assignment of benefits for services provided during this visit. Patient/Guardian expressed understanding and agreed to proceed.   [name], LCSW 06/28/2021

## 2021-06-29 ENCOUNTER — Telehealth: Payer: Self-pay | Admitting: *Deleted

## 2021-06-29 DIAGNOSIS — A084 Viral intestinal infection, unspecified: Secondary | ICD-10-CM

## 2021-06-29 DIAGNOSIS — G629 Polyneuropathy, unspecified: Secondary | ICD-10-CM

## 2021-06-29 DIAGNOSIS — M797 Fibromyalgia: Secondary | ICD-10-CM

## 2021-06-29 MED ORDER — METHOCARBAMOL 750 MG PO TABS: 1500.0000 mg | ORAL_TABLET | Freq: Three times a day (TID) | ORAL | 1 refills | Status: DC | PRN

## 2021-06-29 NOTE — Telephone Encounter (Signed)
See telephone encounter 5/24.

## 2021-06-29 NOTE — Telephone Encounter (Signed)
Please let patient know I have sent an Rx for a different muscle relaxer called robaxin. She can take 2 tablets every 8 hours as needed. My daughter had surgery today so I was not in the office but I will give her a call next week. Please have her schedule an appointment with one of the residents, I don't think I have availability until August.

## 2021-06-29 NOTE — Addendum Note (Signed)
Addended by: Jodean Lima on: 06/29/2021 11:29 AM   Modules accepted: Orders

## 2021-06-29 NOTE — Telephone Encounter (Signed)
Call from pt about her My Chart message on May 31 to Dr Philipp Ovens. Stated she's very stiff; requesting medication. Thanks

## 2021-06-29 NOTE — Telephone Encounter (Signed)
Pt called / informed of new rxx for Robaxin per Dr Philipp Ovens; who stated she will call pt next week. And have pt  schedule an appt  with one of the residents - call transferred to front office.

## 2021-06-30 DIAGNOSIS — G8929 Other chronic pain: Secondary | ICD-10-CM | POA: Diagnosis not present

## 2021-06-30 DIAGNOSIS — Z87891 Personal history of nicotine dependence: Secondary | ICD-10-CM | POA: Diagnosis not present

## 2021-06-30 DIAGNOSIS — D631 Anemia in chronic kidney disease: Secondary | ICD-10-CM | POA: Diagnosis not present

## 2021-06-30 DIAGNOSIS — E1140 Type 2 diabetes mellitus with diabetic neuropathy, unspecified: Secondary | ICD-10-CM | POA: Diagnosis not present

## 2021-06-30 DIAGNOSIS — K219 Gastro-esophageal reflux disease without esophagitis: Secondary | ICD-10-CM | POA: Diagnosis not present

## 2021-06-30 DIAGNOSIS — G4733 Obstructive sleep apnea (adult) (pediatric): Secondary | ICD-10-CM | POA: Diagnosis not present

## 2021-06-30 DIAGNOSIS — K589 Irritable bowel syndrome without diarrhea: Secondary | ICD-10-CM | POA: Diagnosis not present

## 2021-06-30 DIAGNOSIS — Z8673 Personal history of transient ischemic attack (TIA), and cerebral infarction without residual deficits: Secondary | ICD-10-CM | POA: Diagnosis not present

## 2021-06-30 DIAGNOSIS — E1122 Type 2 diabetes mellitus with diabetic chronic kidney disease: Secondary | ICD-10-CM | POA: Diagnosis not present

## 2021-06-30 DIAGNOSIS — M545 Low back pain, unspecified: Secondary | ICD-10-CM | POA: Diagnosis not present

## 2021-06-30 DIAGNOSIS — S83242D Other tear of medial meniscus, current injury, left knee, subsequent encounter: Secondary | ICD-10-CM | POA: Diagnosis not present

## 2021-06-30 DIAGNOSIS — N189 Chronic kidney disease, unspecified: Secondary | ICD-10-CM | POA: Diagnosis not present

## 2021-06-30 DIAGNOSIS — E559 Vitamin D deficiency, unspecified: Secondary | ICD-10-CM | POA: Diagnosis not present

## 2021-06-30 DIAGNOSIS — J9611 Chronic respiratory failure with hypoxia: Secondary | ICD-10-CM | POA: Diagnosis not present

## 2021-06-30 DIAGNOSIS — I509 Heart failure, unspecified: Secondary | ICD-10-CM | POA: Diagnosis not present

## 2021-06-30 DIAGNOSIS — I130 Hypertensive heart and chronic kidney disease with heart failure and stage 1 through stage 4 chronic kidney disease, or unspecified chronic kidney disease: Secondary | ICD-10-CM | POA: Diagnosis not present

## 2021-07-02 MED ORDER — ONDANSETRON 8 MG PO TBDP: 8.0000 mg | ORAL_TABLET | Freq: Three times a day (TID) | ORAL | 1 refills | Status: DC | PRN

## 2021-07-02 MED ORDER — LIDOCAINE 5 % EX PTCH: 2.0000 | MEDICATED_PATCH | CUTANEOUS | 2 refills | Status: DC

## 2021-07-02 NOTE — Telephone Encounter (Addendum)
Called patient regarding recent mychart message. She was experiencing a severe flair of her chronic MSK pain which is likely fibromyalgia. Her pain is mostly myofascial. She had complete spine imaging done within the past couple of years with ortho (outside records) that was normal and she was receiving lumbar facet injections at one point with IR without relief. She has had a difficult time establishing with pain management. She has previously tried and failed lyrica, gabapentin, duloxetine, and amitriptyline all without relief. I was out of the office on Friday but sent at Rx for high dose robaxin which is helping. Tizanidine was ineffective. She was also recently diagnosed with salmonella gastroenteritis and is still recovering from that. Given her acute viral illness and severe muscle cramping, it would be reasonable to have her come in to check electrolytes and basic blood work with CBC / [place]. Discussed ESR / CRP for autoimmune work up. She had a fairly extensive work within the past couple of years including TSH, ANA, RF, lyme serologies, and ESR /CRP which were all negative / non contributory. Patient is worried there is another processes other than fibromyalgia going on, and is asking about work up for stiff person syndrome. I explained that this is a rare autoimmune condition that I am not familiar with but I do not think her symptoms are consistent. I attempted to provide reassurance and recommended continued treatment for fibromyalgia flair. Could consider checking ESR / CRP at upcoming appointment scheduled 6/9 but counseled patient on risk for non specific elevation especially with her recent infection. Plan to continue with PRN robaxin. I have sent refills of her lidocaine patches for peripheral neuropathy and PRN zofran per request. I encouraged weight loss, she has been doing well with ozempic and she is currently active with home health physical therapy. I have cc'd the resident physician she is  scheduled to see as well as the covering clinic attendings as an [place].

## 2021-07-02 NOTE — Addendum Note (Signed)
Addended by: Jodean Lima on: 07/02/2021 02:32 PM   Modules accepted: Orders

## 2021-07-03 DIAGNOSIS — Z87891 Personal history of nicotine dependence: Secondary | ICD-10-CM | POA: Diagnosis not present

## 2021-07-03 DIAGNOSIS — I130 Hypertensive heart and chronic kidney disease with heart failure and stage 1 through stage 4 chronic kidney disease, or unspecified chronic kidney disease: Secondary | ICD-10-CM | POA: Diagnosis not present

## 2021-07-03 DIAGNOSIS — E559 Vitamin D deficiency, unspecified: Secondary | ICD-10-CM | POA: Diagnosis not present

## 2021-07-03 DIAGNOSIS — Z8673 Personal history of transient ischemic attack (TIA), and cerebral infarction without residual deficits: Secondary | ICD-10-CM | POA: Diagnosis not present

## 2021-07-03 DIAGNOSIS — E1140 Type 2 diabetes mellitus with diabetic neuropathy, unspecified: Secondary | ICD-10-CM | POA: Diagnosis not present

## 2021-07-03 DIAGNOSIS — N189 Chronic kidney disease, unspecified: Secondary | ICD-10-CM | POA: Diagnosis not present

## 2021-07-03 DIAGNOSIS — G4733 Obstructive sleep apnea (adult) (pediatric): Secondary | ICD-10-CM | POA: Diagnosis not present

## 2021-07-03 DIAGNOSIS — K589 Irritable bowel syndrome without diarrhea: Secondary | ICD-10-CM | POA: Diagnosis not present

## 2021-07-03 DIAGNOSIS — D631 Anemia in chronic kidney disease: Secondary | ICD-10-CM | POA: Diagnosis not present

## 2021-07-03 DIAGNOSIS — J9611 Chronic respiratory failure with hypoxia: Secondary | ICD-10-CM | POA: Diagnosis not present

## 2021-07-03 DIAGNOSIS — G8929 Other chronic pain: Secondary | ICD-10-CM | POA: Diagnosis not present

## 2021-07-03 DIAGNOSIS — M545 Low back pain, unspecified: Secondary | ICD-10-CM | POA: Diagnosis not present

## 2021-07-03 DIAGNOSIS — I509 Heart failure, unspecified: Secondary | ICD-10-CM | POA: Diagnosis not present

## 2021-07-03 DIAGNOSIS — S83242D Other tear of medial meniscus, current injury, left knee, subsequent encounter: Secondary | ICD-10-CM | POA: Diagnosis not present

## 2021-07-03 DIAGNOSIS — K219 Gastro-esophageal reflux disease without esophagitis: Secondary | ICD-10-CM | POA: Diagnosis not present

## 2021-07-03 DIAGNOSIS — E1122 Type 2 diabetes mellitus with diabetic chronic kidney disease: Secondary | ICD-10-CM | POA: Diagnosis not present

## 2021-07-05 DIAGNOSIS — E1140 Type 2 diabetes mellitus with diabetic neuropathy, unspecified: Secondary | ICD-10-CM | POA: Diagnosis not present

## 2021-07-05 DIAGNOSIS — E1122 Type 2 diabetes mellitus with diabetic chronic kidney disease: Secondary | ICD-10-CM | POA: Diagnosis not present

## 2021-07-05 DIAGNOSIS — E559 Vitamin D deficiency, unspecified: Secondary | ICD-10-CM | POA: Diagnosis not present

## 2021-07-05 DIAGNOSIS — G4733 Obstructive sleep apnea (adult) (pediatric): Secondary | ICD-10-CM | POA: Diagnosis not present

## 2021-07-05 DIAGNOSIS — D631 Anemia in chronic kidney disease: Secondary | ICD-10-CM | POA: Diagnosis not present

## 2021-07-05 DIAGNOSIS — J9611 Chronic respiratory failure with hypoxia: Secondary | ICD-10-CM | POA: Diagnosis not present

## 2021-07-05 DIAGNOSIS — Z87891 Personal history of nicotine dependence: Secondary | ICD-10-CM | POA: Diagnosis not present

## 2021-07-05 DIAGNOSIS — G8929 Other chronic pain: Secondary | ICD-10-CM | POA: Diagnosis not present

## 2021-07-05 DIAGNOSIS — S83242D Other tear of medial meniscus, current injury, left knee, subsequent encounter: Secondary | ICD-10-CM | POA: Diagnosis not present

## 2021-07-05 DIAGNOSIS — K589 Irritable bowel syndrome without diarrhea: Secondary | ICD-10-CM | POA: Diagnosis not present

## 2021-07-05 DIAGNOSIS — I130 Hypertensive heart and chronic kidney disease with heart failure and stage 1 through stage 4 chronic kidney disease, or unspecified chronic kidney disease: Secondary | ICD-10-CM | POA: Diagnosis not present

## 2021-07-05 DIAGNOSIS — M545 Low back pain, unspecified: Secondary | ICD-10-CM | POA: Diagnosis not present

## 2021-07-05 DIAGNOSIS — I509 Heart failure, unspecified: Secondary | ICD-10-CM | POA: Diagnosis not present

## 2021-07-05 DIAGNOSIS — Z8673 Personal history of transient ischemic attack (TIA), and cerebral infarction without residual deficits: Secondary | ICD-10-CM | POA: Diagnosis not present

## 2021-07-05 DIAGNOSIS — K219 Gastro-esophageal reflux disease without esophagitis: Secondary | ICD-10-CM | POA: Diagnosis not present

## 2021-07-05 DIAGNOSIS — N189 Chronic kidney disease, unspecified: Secondary | ICD-10-CM | POA: Diagnosis not present

## 2021-07-06 ENCOUNTER — Ambulatory Visit (INDEPENDENT_AMBULATORY_CARE_PROVIDER_SITE_OTHER): Payer: Medicare Other | Admitting: Student

## 2021-07-06 DIAGNOSIS — M797 Fibromyalgia: Secondary | ICD-10-CM

## 2021-07-06 DIAGNOSIS — J9620 Acute and chronic respiratory failure, unspecified whether with hypoxia or hypercapnia: Secondary | ICD-10-CM | POA: Diagnosis not present

## 2021-07-06 NOTE — Assessment & Plan Note (Signed)
Ms. Grenz states that she continues to have leg and back pain and stiffness. She has a history of chronic leg and back pain and is currently experiencing a flare of her MSK symptoms. She was told that she may have fibromyalgia and that this is causing her symptoms. She has failed therapy with lyrica, gabapentin, cymbalta, amitriptyline, and tizanidine. She had spine imaging that was normal. She was also tried on lumbar facet injections without relief. Her pain is currently unchanged since her last conversation with Dr. Philipp Ovens, her PCP. In terms of her stiffness, she does not that she has some improvement with high dose robaxin and it allows her to work with physical therapy. She does note that physical therapy has not been helping with symptoms.   Ms. Deblanc is also recovering from Salmonella gastroenteritis, which may have been playing a role in exacerbating her symptoms. She states that her bowel movements are becoming more regular, but her nausea is somewhat persisting. States that zofran has been helping and she is not need to take as much of it anymore.   Per Dr. Rivka Safer last conversation with patient, plan was to check a CBC and CMP along with ESR/CRP to assess for underlying rheumatological disease (she has had negative autoimmune workup with negative TSH, ANA, RF, lyme serologies, ESR/CRP in the past). Unfortunately, Ms. Taillon was unable to come in person today due to transportation issues. Discussed rescheduling for an in-person office visit next week so that we can obtain lab work and reassess symptoms with resolution of gastroenteritis. She is in agreement with plan.  Plan: -continue high dose robaxin prn and physical therapy -f/u in-person next week for physical exam and to obtain lab-work as outlined above

## 2021-07-06 NOTE — Progress Notes (Signed)
[facility] Residency Telephone Encounter Continuity Care Appointment  HPI:  This telephone encounter was created for [name] on 07/06/2021 for the following purpose/cc: muscle pain and stiffness. Identified patient's identity via DOB and home address. Lindsay [name] states that she continues to have leg and back pain and stiffness. She has a history of chronic leg and back pain and is currently experiencing a flare of her MSK symptoms. She was told that she may have fibromyalgia and that this is causing her symptoms. She has failed therapy with lyrica, gabapentin, cymbalta, amitriptyline, and tizanidine. She had spine imaging that was normal. She was also tried on lumbar facet injections without relief. Her pain is currently unchanged since her last conversation with Dr. Philipp Ovens, her PCP. In terms of her stiffness, she does not that she has some improvement with high dose robaxin and it allows her to work with physical therapy. She does note that physical therapy has not been helping with symptoms.  Lindsay [name] is also recovering from Salmonella gastroenteritis, which may have been playing a role in exacerbating her symptoms. She states that her bowel movements are becoming more regular, but her nausea is somewhat persisting. States that zofran has been helping and she is not need to take as much of it anymore.  Per Dr. Rivka Safer last conversation with patient, plan was to check a CBC and CMP along with ESR/CRP to assess for underlying rheumatological disease (she has had negative autoimmune workup with negative TSH, ANA, RF, lyme serologies, ESR/CRP in the past). Unfortunately, Lindsay [name] was unable to come in person today due to transportation issues. Discussed rescheduling for an in-person office visit next week so that we can obtain lab work and reassess symptoms with resolution of gastroenteritis. She is in agreement with plan.   Past Medical History:  Past Medical History:   Diagnosis Date   Acid reflux    Anemia    Iron Def   Anorexia    CHF (congestive heart failure) ([facility])    Chronic kidney disease    Nephrotic syndrome   Colon polyp 2009   Depression with anxiety    Edema leg    Fibromyalgia    Hemorrhoids    Hidradenitis suppurativa    Hypertension    IBS (irritable bowel syndrome)    Low back pain    Migraines    Morbidly obese (HCC)    Neuromuscular disorder (HCC)    fibromyalgia   Neuropathy    Panic attacks    Polyp of vocal cord or larynx    Tonsil pain      ROS:  Negative aside from that in HPI.   Assessment / Plan / Recommendations:  Please see A&P under problem oriented charting for assessment of the patient's acute and chronic medical conditions.  As always, pt is advised that if symptoms worsen or new symptoms arise, they should go to an urgent care facility or to to ER for further evaluation.   Consent and Medical Decision Making:  Patient discussed with Dr. Dareen Piano This is a telephone encounter between Lindsay [name] and Virl Axe on 07/06/2021 for MSK pain and stiffness. The visit was conducted with the patient located at home and Virl Axe at [facility]. The patient's identity was confirmed using their DOB and current address. The patient has consented to being evaluated through a telephone encounter and understands the associated risks (an examination cannot be done and the patient may need to come in for an appointment) /  benefits (allows the patient to remain at home, decreasing exposure to coronavirus). I personally spent 15 minutes on medical discussion.

## 2021-07-10 DIAGNOSIS — G4733 Obstructive sleep apnea (adult) (pediatric): Secondary | ICD-10-CM | POA: Diagnosis not present

## 2021-07-10 DIAGNOSIS — Z8673 Personal history of transient ischemic attack (TIA), and cerebral infarction without residual deficits: Secondary | ICD-10-CM | POA: Diagnosis not present

## 2021-07-10 DIAGNOSIS — E1140 Type 2 diabetes mellitus with diabetic neuropathy, unspecified: Secondary | ICD-10-CM | POA: Diagnosis not present

## 2021-07-10 DIAGNOSIS — K219 Gastro-esophageal reflux disease without esophagitis: Secondary | ICD-10-CM | POA: Diagnosis not present

## 2021-07-10 DIAGNOSIS — K589 Irritable bowel syndrome without diarrhea: Secondary | ICD-10-CM | POA: Diagnosis not present

## 2021-07-10 DIAGNOSIS — M545 Low back pain, unspecified: Secondary | ICD-10-CM | POA: Diagnosis not present

## 2021-07-10 DIAGNOSIS — E1122 Type 2 diabetes mellitus with diabetic chronic kidney disease: Secondary | ICD-10-CM | POA: Diagnosis not present

## 2021-07-10 DIAGNOSIS — Z87891 Personal history of nicotine dependence: Secondary | ICD-10-CM | POA: Diagnosis not present

## 2021-07-10 DIAGNOSIS — G8929 Other chronic pain: Secondary | ICD-10-CM | POA: Diagnosis not present

## 2021-07-10 DIAGNOSIS — N189 Chronic kidney disease, unspecified: Secondary | ICD-10-CM | POA: Diagnosis not present

## 2021-07-10 DIAGNOSIS — I509 Heart failure, unspecified: Secondary | ICD-10-CM | POA: Diagnosis not present

## 2021-07-10 DIAGNOSIS — E559 Vitamin D deficiency, unspecified: Secondary | ICD-10-CM | POA: Diagnosis not present

## 2021-07-10 DIAGNOSIS — I130 Hypertensive heart and chronic kidney disease with heart failure and stage 1 through stage 4 chronic kidney disease, or unspecified chronic kidney disease: Secondary | ICD-10-CM | POA: Diagnosis not present

## 2021-07-10 DIAGNOSIS — S83242D Other tear of medial meniscus, current injury, left knee, subsequent encounter: Secondary | ICD-10-CM | POA: Diagnosis not present

## 2021-07-10 DIAGNOSIS — D631 Anemia in chronic kidney disease: Secondary | ICD-10-CM | POA: Diagnosis not present

## 2021-07-10 DIAGNOSIS — J9611 Chronic respiratory failure with hypoxia: Secondary | ICD-10-CM | POA: Diagnosis not present

## 2021-07-11 ENCOUNTER — Other Ambulatory Visit: Payer: Self-pay | Admitting: Pulmonary Disease

## 2021-07-11 DIAGNOSIS — R059 Cough, unspecified: Secondary | ICD-10-CM

## 2021-07-12 ENCOUNTER — Ambulatory Visit (INDEPENDENT_AMBULATORY_CARE_PROVIDER_SITE_OTHER): Payer: Medicare Other | Admitting: Licensed Clinical Social Worker

## 2021-07-12 DIAGNOSIS — F331 Major depressive disorder, recurrent, moderate: Secondary | ICD-10-CM | POA: Diagnosis not present

## 2021-07-12 DIAGNOSIS — F410 Panic disorder [episodic paroxysmal anxiety] without agoraphobia: Secondary | ICD-10-CM

## 2021-07-12 DIAGNOSIS — F4312 Post-traumatic stress disorder, chronic: Secondary | ICD-10-CM | POA: Diagnosis not present

## 2021-07-12 DIAGNOSIS — F411 Generalized anxiety disorder: Secondary | ICD-10-CM

## 2021-07-12 NOTE — Progress Notes (Signed)
Virtual Visit via Video Note  I connected with Lindsay [name] on 07/12/21 at  8:00 AM EDT by a video enabled telemedicine application and verified that I am speaking with the correct person using two identifiers.  -Experience technological difficulties with connectivity so switched to phone during session Location: Patient: outside at her home Provider: home office   I discussed the limitations of evaluation and management by telemedicine and the availability of in person appointments. The patient expressed understanding and agreed to proceed.  I discussed the assessment and treatment plan with the patient. The patient was provided an opportunity to ask questions and all were answered. The patient agreed with the plan and demonstrated an understanding of the instructions.   The patient was advised to call back or seek an in-person evaluation if the symptoms worsen or if the condition fails to improve as anticipated.  I provided 47 minutes of non-face-to-face time during this encounter.  THERAPIST PROGRESS NOTE  Session Time: 8:00 AM to 8:47 AM  Participation Level: Active  Behavioral Response: CasualAlertDysphoric  Type of Therapy: Individual Therapy  Treatment Goals addressed: Patient continues to find therapy helpful for supportive and strength-based interventions as well as continue to focus on seeking help and coping with medical issues, anxiety, triggers, coping, utilize therapy as a way for patient to focus on working through current stressors that also helps to distract from pain  ProgressTowards Goals: Progressing-reinforced patient difficulty in navigating health system as well as encouraging her with being her own advocate as sometimes needs her neglected by providers, utilizing session to process feelings to help cope with stressors, therapy itself being a form of distraction and support  Interventions: Solution Focused, Strength-based, Supportive, and Other:  Coping  Summary: Lindsay [name] is a 51 y.o. female who presents with got to get blood work done. Changing the muscle relaxer to a stronger one. Robaxin. 50 mg 3x a day. Still feel a lot of pain no where near what it was. To the point where can take physical therapy. Has to get a hold of Environmental education officer for transportation. Wants to get checked for whatever to see what is causing pain. Research to find out what is going on. Wonders if stiff man's disease. Diagnosed with fibromyalgia flared up is what doctors think. For a long time trying to get someone's attention to address ongoing symptoms. Wants to check electrolytes causing the stiffness. Supposed to get x-ray done. Thoracic, spine and knee doesn't believe the other doctor wants to get his own tests. He hasn't done it yet. When she went the lady something was not on the order. Fax it over. Tried to contact him through message and nurse and haven't heard anything. Physical therapy 3x a week. Paid for it until end of July. Come and do reassessment feels not doing anything but she moves around better when she does it. She tries to do on her own told her to do on her own. Each person has her own plan who comes and need not for them to do. Medicine helps her to move she believes what is helping. Needs a cream Capaicin cream-helps with soreness goes to muscle helps her to bend her knees. Can move around and get off bed. Helps knees but not back. Hopefully can move around so can take fluid pill twice a day. Heart beating funny uncomfortable when move around on verge of hurting a little. Going back to heart doctor July 10. See if they can work her in. Dr. Jeannette Corpus heart  doctor. Pays attention to the medicine knows what is going on with her medical history. Still worries her because in pain. Instead of excruciating pain at 8-9 want to be at 6-7. Instead of where it was at a 10. Doesn't think they can stop it something God would have to do thinks best she can hope is  reduce pain. Notice can't breath when move around, using oxygen all the time made a call to pulmonary doctor. It has been kind of rough same thing nothing changed come out more often trying to do physical therapy. Friend comes and daughter raps free style. Patient used to rap a lot. She takes patient's advice and puts in her rap. She plays beat that can rap to. Instrumental hip hop music to rap to. Told her to stick to college courses.  The rap career is not a short guarantee. She is studying criminal justice administration her dream to be Retail banker. Patient used to watch Criminal Minds and Numbers stop watching that. Where lived at a lot of shooting, drugs people found dead lost her taste for it. Watch movies, cartoons, comedy stuff. Most days spend time finding something to watch.  Of note during session talked about tooth pain issues.  Therapist shared her own experience and through conversation pointed out often not talked about but dealing with broken teeth going into the nerve so dealing with pain issues there therapist noting with her own tooth issues able to be more aware of what patient is going through.  Patient really dealing with significant challenges just with her teeth alone  Therapist continues to encourage patient and taking steps to address and find out what is going on with medical issues, navigating the system finding out sometimes she has to be her best advocate when doctors are not being proactive.  Utilize reframing to focus on some positives she is in less pain but when we gauged the improvement minimal improvement.  Still focused on some positive from it that she can move around a little bit more can do physical therapy encouraged her with positive momentum this gives her of getting on water pill and continuing to navigate system to uncover issues so she can get relief.  Things such as getting blood work done to find out other things that may be going on.  Focus on things that give  patient relief noted being outside in the morning as nice, listening to daughter's wrap and patient helps her with that noting one of her skills and strengths in his ability to write and do poetry and wrap.  Says helpful as well for patient to sit outside in early morning air and do therapy is helpful for mood.  Therapist provided space and support for patient talk about thoughts and feelings in session. Suicidal/Homicidal: No  Plan: Return again in 2 weeks.2.Find topics that help with mood and are distracting from patient's pain, continue to provide supportive as well as problem solving strategies to help patient with frustrations and roadblocks to medical care  Diagnosis: Major depressive disorder, recurrent, moderate, generalized anxiety disorder, chronic PTSD, panic attacks  Collaboration of Care: Other review of Dr. Allyson Sabal note 07/06/21 to learn update on medical issues  Patient/Guardian was advised Release of Information must be obtained prior to any record release in order to collaborate their care with an outside provider. Patient/Guardian was advised if they have not already done so to contact the registration department to sign all necessary forms in order for [name] to release information  regarding their care.   Consent: Patient/Guardian gives verbal consent for treatment and assignment of benefits for services provided during this visit. Patient/Guardian expressed understanding and agreed to proceed.   [name], LCSW 07/12/2021

## 2021-07-13 ENCOUNTER — Other Ambulatory Visit: Payer: Self-pay | Admitting: *Deleted

## 2021-07-13 DIAGNOSIS — A084 Viral intestinal infection, unspecified: Secondary | ICD-10-CM

## 2021-07-13 MED ORDER — ONDANSETRON 8 MG PO TBDP: 8.0000 mg | ORAL_TABLET | Freq: Three times a day (TID) | ORAL | 1 refills | Status: DC | PRN

## 2021-07-13 NOTE — Telephone Encounter (Signed)
Pt called back - informed pt of directions for Zofran - " take every 8 hours as needed". Stated she understands.

## 2021-07-13 NOTE — Telephone Encounter (Signed)
Approved refill. Please let her know not to take more frequently then every 8 hours as instructed. Thank you.

## 2021-07-13 NOTE — Telephone Encounter (Signed)
Pt's calling /requesting a refill on Ondansetron. It was refilled on the 4th and 10th; pt states she takes it every 6-8 hours. And she only has 1 pill left. She has an appt with Dr Allyson Sabal on Wed 6/21.

## 2021-07-13 NOTE — Telephone Encounter (Signed)
Called pt about refill - no answer; left message of refill and to take it no more than every 8 hrs".

## 2021-07-16 ENCOUNTER — Other Ambulatory Visit: Payer: Self-pay

## 2021-07-16 DIAGNOSIS — G8929 Other chronic pain: Secondary | ICD-10-CM

## 2021-07-16 MED ORDER — METHOCARBAMOL 750 MG PO TABS: 1500.0000 mg | ORAL_TABLET | Freq: Three times a day (TID) | ORAL | 1 refills | Status: DC | PRN

## 2021-07-16 NOTE — Telephone Encounter (Signed)
Approved robaxin.

## 2021-07-18 ENCOUNTER — Other Ambulatory Visit: Payer: Self-pay

## 2021-07-18 ENCOUNTER — Encounter: Payer: Self-pay | Admitting: Student

## 2021-07-18 ENCOUNTER — Ambulatory Visit (INDEPENDENT_AMBULATORY_CARE_PROVIDER_SITE_OTHER): Payer: Medicare Other | Admitting: Student

## 2021-07-18 VITALS — BP 106/75 | HR 90 | Temp 98.5°F | Resp 32 | Ht 70.0 in | Wt 370.7 lb

## 2021-07-18 DIAGNOSIS — Z9981 Dependence on supplemental oxygen: Secondary | ICD-10-CM | POA: Diagnosis not present

## 2021-07-18 DIAGNOSIS — G8929 Other chronic pain: Secondary | ICD-10-CM

## 2021-07-18 DIAGNOSIS — M79604 Pain in right leg: Secondary | ICD-10-CM | POA: Diagnosis not present

## 2021-07-18 DIAGNOSIS — M797 Fibromyalgia: Secondary | ICD-10-CM

## 2021-07-18 DIAGNOSIS — M549 Dorsalgia, unspecified: Secondary | ICD-10-CM | POA: Diagnosis not present

## 2021-07-18 DIAGNOSIS — Z87891 Personal history of nicotine dependence: Secondary | ICD-10-CM | POA: Diagnosis not present

## 2021-07-18 DIAGNOSIS — J9611 Chronic respiratory failure with hypoxia: Secondary | ICD-10-CM | POA: Diagnosis not present

## 2021-07-18 DIAGNOSIS — M79605 Pain in left leg: Secondary | ICD-10-CM | POA: Diagnosis not present

## 2021-07-18 NOTE — Patient Instructions (Signed)
Ms. Teti,  It was a pleasure seeing you in the clinic today.   We are checking some labs today. I will call you with the results. Please continue being active and working with physical therapy. Use your robaxin as needed to help you perform your daily activities. I will speak with Dr. Philipp Ovens, your primary care doctor, and figure out the next steps and give you a call. Please come back in 1 month for your next visit.  Please call our clinic at [phone] if you have any questions or concerns. The best time to call is Monday-Friday from 9am-4pm, but there is someone available 24/7 at the same number. If you need medication refills, please notify your pharmacy one week in advance and they will send [name] a request.   Thank you for letting us take part in your care. We look forward to seeing you next time!

## 2021-07-18 NOTE — Progress Notes (Signed)
   CC: telehealth f/u for fibromyalgia  HPI:  [name] is a 51 y.o. female with history listed below presenting to the [facility] for telehealth f/u for fibromyalgia. Please see individualized problem based charting for full HPI.  Past Medical History:  Diagnosis Date   Acid reflux    Anemia    Iron Def   Anorexia    CHF (congestive heart failure) (HCC)    Chronic kidney disease    Nephrotic syndrome   Colon polyp 2009   Depression with anxiety    Edema leg    Fibromyalgia    Hemorrhoids    Hidradenitis suppurativa    Hypertension    IBS (irritable bowel syndrome)    Low back pain    Migraines    Morbidly obese (HCC)    Neuromuscular disorder (HCC)    fibromyalgia   Neuropathy    Panic attacks    Polyp of vocal cord or larynx    Tonsil pain     Review of Systems:  Negative aside from that listed in individualized problem based charting.  Physical Exam:  Vitals:   07/18/21 0845  BP: 106/75  Pulse: 90  Resp: (!) 32  Temp: 98.5 F (36.9 C)  TempSrc: Oral  SpO2: 98%  Weight: (!) 370 lb 11.2 oz (168.1 kg)  Height: '5\' 10"'$  (1.778 m)   Physical Exam Constitutional:      Appearance: She is obese. She is not ill-appearing.  HENT:     Mouth/Throat:     Mouth: Mucous membranes are moist.     Pharynx: Oropharynx is clear.  Eyes:     Extraocular Movements: Extraocular movements intact.     Conjunctiva/sclera: Conjunctivae normal.     Pupils: Pupils are equal, round, and reactive to light.  Cardiovascular:     Rate and Rhythm: Normal rate and regular rhythm.     Pulses: Normal pulses.     Heart sounds: Normal heart sounds. No murmur heard.    No gallop.  Pulmonary:     Breath sounds: No wheezing, rhonchi or rales.     Comments: Tachypneic but clear to auscultation bilaterally. Abdominal:     General: Bowel sounds are normal. There is no distension.     Palpations: Abdomen is soft.     Tenderness: There is no abdominal tenderness.  Musculoskeletal:      Comments: Limited ROM in bilateral knees, bilateral hips, and lower back due to pain. Tenderness to palpation in these areas as well.  Skin:    General: Skin is warm and dry.  Neurological:     General: No focal deficit present.     Mental Status: She is alert and oriented to person, place, and time.  Psychiatric:        Mood and Affect: Mood normal.        Behavior: Behavior normal.      Assessment & Plan:   See Encounters Tab for problem based charting.  Patient discussed with Dr. Evette Doffing

## 2021-07-18 NOTE — Assessment & Plan Note (Signed)
Patient here for telehealth follow up for fibromyalgia. Please see prior note from me on 07/06/2021 for full details. She states that her pain is relatively unchanged and has been significantly limiting her functional status at home. She is unable to fully perform her ADLs and iADLs due to pain. States that high dose robaxin has been helping to complete certain tasks, such as working with physical therapy and coming to doctor appointments (needed it prior to coming today), but does not help alleviate her symptoms to a point where she can be functional at home. As previously noted, will obtain CBC, CMP, ESR/CRP today to assess for underlying rheumatological disease (she has had negative workup in the past). I will also speak with patient's PCP, Dr. Philipp Ovens, to discuss next steps in pain control to allow for patient to become more functional.   Plan: -f/u CBC, CMP, ESR/CRP -continue high dose robaxin and PT -speak with PCP to discuss next steps

## 2021-07-18 NOTE — Assessment & Plan Note (Addendum)
Patient with history of chronic hypoxic respiratory failure thought to be secondary to OSA/OHS. She uses home oxygen supplementation daily, especially with ambulation. Although SpO2 at rest was acceptable today, she did have tachypnea and subjective SHOB. She does continue to feel short of breath with exertion and will require continued home oxygen therapy for relief. Per discussion with patient, she uses oxygen at rest and with ambulation around her home. Given continued shortness of breath at home, she has rescheduled an appointment with her pulmonologist for 08/15/2021.   Plan:  -will renew oxygen recertification for ongoing home oxygen needs -advised to continue exercising as able to promote weight loss

## 2021-07-19 DIAGNOSIS — D631 Anemia in chronic kidney disease: Secondary | ICD-10-CM | POA: Diagnosis not present

## 2021-07-19 DIAGNOSIS — E559 Vitamin D deficiency, unspecified: Secondary | ICD-10-CM | POA: Diagnosis not present

## 2021-07-19 DIAGNOSIS — N189 Chronic kidney disease, unspecified: Secondary | ICD-10-CM | POA: Diagnosis not present

## 2021-07-19 DIAGNOSIS — K219 Gastro-esophageal reflux disease without esophagitis: Secondary | ICD-10-CM | POA: Diagnosis not present

## 2021-07-19 DIAGNOSIS — G8929 Other chronic pain: Secondary | ICD-10-CM | POA: Diagnosis not present

## 2021-07-19 DIAGNOSIS — E1122 Type 2 diabetes mellitus with diabetic chronic kidney disease: Secondary | ICD-10-CM | POA: Diagnosis not present

## 2021-07-19 DIAGNOSIS — G4733 Obstructive sleep apnea (adult) (pediatric): Secondary | ICD-10-CM | POA: Diagnosis not present

## 2021-07-19 DIAGNOSIS — K589 Irritable bowel syndrome without diarrhea: Secondary | ICD-10-CM | POA: Diagnosis not present

## 2021-07-19 DIAGNOSIS — I130 Hypertensive heart and chronic kidney disease with heart failure and stage 1 through stage 4 chronic kidney disease, or unspecified chronic kidney disease: Secondary | ICD-10-CM | POA: Diagnosis not present

## 2021-07-19 DIAGNOSIS — S83242D Other tear of medial meniscus, current injury, left knee, subsequent encounter: Secondary | ICD-10-CM | POA: Diagnosis not present

## 2021-07-19 DIAGNOSIS — Z8673 Personal history of transient ischemic attack (TIA), and cerebral infarction without residual deficits: Secondary | ICD-10-CM | POA: Diagnosis not present

## 2021-07-19 DIAGNOSIS — J9611 Chronic respiratory failure with hypoxia: Secondary | ICD-10-CM | POA: Diagnosis not present

## 2021-07-19 DIAGNOSIS — I509 Heart failure, unspecified: Secondary | ICD-10-CM | POA: Diagnosis not present

## 2021-07-19 DIAGNOSIS — M545 Low back pain, unspecified: Secondary | ICD-10-CM | POA: Diagnosis not present

## 2021-07-19 DIAGNOSIS — E1140 Type 2 diabetes mellitus with diabetic neuropathy, unspecified: Secondary | ICD-10-CM | POA: Diagnosis not present

## 2021-07-19 DIAGNOSIS — Z87891 Personal history of nicotine dependence: Secondary | ICD-10-CM | POA: Diagnosis not present

## 2021-07-19 LAB — CMP14 + ANION GAP
ALT: 8 [IU]/L (ref 0–32)
AST: 8 [IU]/L (ref 0–40)
Albumin/Globulin Ratio: 1.5 (ref 1.2–2.2)
Albumin: 4.1 g/dL (ref 3.8–4.9)
Alkaline Phosphatase: 88 [IU]/L (ref 44–121)
Anion Gap: 16 mmol/L (ref 10.0–18.0)
BUN/Creatinine Ratio: 14 (ref 9–23)
BUN: 10 mg/dL (ref 6–24)
Bilirubin Total: <0.2 mg/dL (ref 0.0–1.2)
CO2: 21 mmol/L (ref 20–29)
Calcium: 9.3 mg/dL (ref 8.7–10.2)
Chloride: 105 mmol/L (ref 96–106)
Creatinine, Ser: 0.72 mg/dL (ref 0.57–1.00)
Globulin, Total: 2.7 g/dL (ref 1.5–4.5)
Glucose: 94 mg/dL (ref 70–99)
Potassium: 4.6 mmol/L (ref 3.5–5.2)
Sodium: 142 mmol/L (ref 134–144)
Total Protein: 6.8 g/dL (ref 6.0–8.5)
eGFR: 101 mL/min/{1.73_m2}

## 2021-07-19 LAB — SEDIMENTATION RATE: Sed Rate: 41 mm/hr — ABNORMAL HIGH (ref 0–40)

## 2021-07-19 LAB — CBC
Hematocrit: 34.2 % (ref 34.0–46.6)
Hemoglobin: 10.9 g/dL — ABNORMAL LOW (ref 11.1–15.9)
MCH: 26 pg — ABNORMAL LOW (ref 26.6–33.0)
MCHC: 31.9 g/dL (ref 31.5–35.7)
MCV: 82 fL (ref 79–97)
Platelets: 446 10*3/uL (ref 150–450)
RBC: 4.19 x10E6/uL (ref 3.77–5.28)
RDW: 14.5 % (ref 11.7–15.4)
WBC: 7.9 10*3/uL (ref 3.4–10.8)

## 2021-07-19 LAB — C-REACTIVE PROTEIN: CRP: 18 mg/L — ABNORMAL HIGH (ref 0–10)

## 2021-07-19 NOTE — Progress Notes (Signed)
Internal Medicine Clinic Attending  Case discussed with Dr. Jinwala  At the time of the visit.  We reviewed the resident's history and exam and pertinent patient test results.  I agree with the assessment, diagnosis, and plan of care documented in the resident's note.  

## 2021-07-20 ENCOUNTER — Encounter: Payer: Self-pay | Admitting: Internal Medicine

## 2021-07-20 DIAGNOSIS — M797 Fibromyalgia: Secondary | ICD-10-CM

## 2021-07-22 ENCOUNTER — Telehealth: Payer: Self-pay | Admitting: Student

## 2021-07-22 DIAGNOSIS — M797 Fibromyalgia: Secondary | ICD-10-CM

## 2021-07-22 MED ORDER — TIZANIDINE HCL 4 MG PO TABS: 4.0000 mg | ORAL_TABLET | Freq: Three times a day (TID) | ORAL | 0 refills | Status: AC | PRN

## 2021-07-23 MED ORDER — TRAMADOL HCL 50 MG PO TABS: 50.0000 mg | ORAL_TABLET | Freq: Four times a day (QID) | ORAL | 0 refills | Status: DC | PRN

## 2021-07-26 ENCOUNTER — Other Ambulatory Visit: Payer: Self-pay | Admitting: Internal Medicine

## 2021-07-26 DIAGNOSIS — E559 Vitamin D deficiency, unspecified: Secondary | ICD-10-CM | POA: Diagnosis not present

## 2021-07-26 DIAGNOSIS — E1140 Type 2 diabetes mellitus with diabetic neuropathy, unspecified: Secondary | ICD-10-CM | POA: Diagnosis not present

## 2021-07-26 DIAGNOSIS — K219 Gastro-esophageal reflux disease without esophagitis: Secondary | ICD-10-CM | POA: Diagnosis not present

## 2021-07-26 DIAGNOSIS — N189 Chronic kidney disease, unspecified: Secondary | ICD-10-CM | POA: Diagnosis not present

## 2021-07-26 DIAGNOSIS — G8929 Other chronic pain: Secondary | ICD-10-CM | POA: Diagnosis not present

## 2021-07-26 DIAGNOSIS — I509 Heart failure, unspecified: Secondary | ICD-10-CM | POA: Diagnosis not present

## 2021-07-26 DIAGNOSIS — E119 Type 2 diabetes mellitus without complications: Secondary | ICD-10-CM

## 2021-07-26 DIAGNOSIS — J9611 Chronic respiratory failure with hypoxia: Secondary | ICD-10-CM | POA: Diagnosis not present

## 2021-07-26 DIAGNOSIS — D631 Anemia in chronic kidney disease: Secondary | ICD-10-CM | POA: Diagnosis not present

## 2021-07-26 DIAGNOSIS — Z8673 Personal history of transient ischemic attack (TIA), and cerebral infarction without residual deficits: Secondary | ICD-10-CM | POA: Diagnosis not present

## 2021-07-26 DIAGNOSIS — Z87891 Personal history of nicotine dependence: Secondary | ICD-10-CM | POA: Diagnosis not present

## 2021-07-26 DIAGNOSIS — M545 Low back pain, unspecified: Secondary | ICD-10-CM | POA: Diagnosis not present

## 2021-07-26 DIAGNOSIS — I130 Hypertensive heart and chronic kidney disease with heart failure and stage 1 through stage 4 chronic kidney disease, or unspecified chronic kidney disease: Secondary | ICD-10-CM | POA: Diagnosis not present

## 2021-07-26 DIAGNOSIS — S83242D Other tear of medial meniscus, current injury, left knee, subsequent encounter: Secondary | ICD-10-CM | POA: Diagnosis not present

## 2021-07-26 DIAGNOSIS — E1122 Type 2 diabetes mellitus with diabetic chronic kidney disease: Secondary | ICD-10-CM | POA: Diagnosis not present

## 2021-07-26 DIAGNOSIS — K589 Irritable bowel syndrome without diarrhea: Secondary | ICD-10-CM | POA: Diagnosis not present

## 2021-07-26 DIAGNOSIS — G4733 Obstructive sleep apnea (adult) (pediatric): Secondary | ICD-10-CM | POA: Diagnosis not present

## 2021-07-27 ENCOUNTER — Ambulatory Visit (INDEPENDENT_AMBULATORY_CARE_PROVIDER_SITE_OTHER): Payer: Medicare Other | Admitting: Licensed Clinical Social Worker

## 2021-07-27 ENCOUNTER — Telehealth: Payer: Self-pay

## 2021-07-27 ENCOUNTER — Telehealth: Payer: Self-pay | Admitting: Student

## 2021-07-27 DIAGNOSIS — F331 Major depressive disorder, recurrent, moderate: Secondary | ICD-10-CM

## 2021-07-27 DIAGNOSIS — F410 Panic disorder [episodic paroxysmal anxiety] without agoraphobia: Secondary | ICD-10-CM

## 2021-07-27 DIAGNOSIS — F4312 Post-traumatic stress disorder, chronic: Secondary | ICD-10-CM | POA: Diagnosis not present

## 2021-07-27 DIAGNOSIS — F411 Generalized anxiety disorder: Secondary | ICD-10-CM | POA: Diagnosis not present

## 2021-07-27 NOTE — Telephone Encounter (Signed)
Pt is requesting a call back .[name] She stated that the meds that she was given for her legs is not working still .[name] She stated that she spoke to Dr Philipp Ovens on 6/26 .[name] She stated that she did start the med the same day

## 2021-07-27 NOTE — Telephone Encounter (Signed)
Please tell her to keep her scheduled appointment on 7/26 or schedule an appointment to be seen sooner. It is not appropriate for me to continue making medication changes over the phone. I am out on vacation next week but will be back the following week. Thank you.

## 2021-07-27 NOTE — Telephone Encounter (Signed)
Received a call from patient regarding her pain.  Said that she was recently started on tramadol but her pain has not been relieved.  Said that tramadol did not help with the pain at all.  Pain is mainly located at her extremities and her lower back from fibromyalgia and arthritis.  She is also taking Tylenol 1 g every 8 hours.  Her next appointment is on 7/26 with me.  I will ask the front desk to make an earlier appointment next week so we can reassess her pain.  Advised patient that if pain becomes intolerable, she can either call us or call EMS.  Patient verbalizes understanding.

## 2021-07-27 NOTE — Progress Notes (Signed)
Virtual Visit via Video Note  I connected with Lindsay [name] on 07/27/21 at  8:00 AM EDT by a video enabled telemedicine application and verified that I am speaking with the correct person using two identifiers.  Location: Patient: home Provider: home office   I discussed the limitations of evaluation and management by telemedicine and the availability of in person appointments. The patient expressed understanding and agreed to proceed.   I discussed the assessment and treatment plan with the patient. The patient was provided an opportunity to ask questions and all were answered. The patient agreed with the plan and demonstrated an understanding of the instructions.   The patient was advised to call back or seek an in-person evaluation if the symptoms worsen or if the condition fails to improve as anticipated.  I provided 30 minutes of non-face-to-face time during this encounter.  THERAPIST PROGRESS NOTE  Session Time: 8:00 AM to 8:30 AM  Participation Level: Active  Behavioral Response: CasualAlertDysphoric  Type of Therapy: Individual Therapy  Treatment Goals addressed: Patient continues to find therapy helpful for supportive and strength-based interventions as well as continue to focus on seeking help and coping with medical issues, anxiety, triggers, coping, utilize therapy as a way for patient to focus on working through current stressors that also helps to distract from pain  ProgressTowards Goals: Progressing-patient identifies therapy very helpful for coping actively working through her stressors identifying strategies to manage in session  Interventions: Solution Focused, Strength-based, Supportive, and Other: Coping  Summary: Lindsay [name] is a 51 y.o. female who presents with doctor put her on Tramadol patient says not helping. Doctor said pain related to fibromyalgia. A couple of results indicate inflammation in the body. Doctor said high but not high enough for autoimmune  disease or inflammation. Patient thinks there is something else going on. When asked how doing "hanging in there" Still pain puts at 10 not as excruciating but at a number 10. SED she said normal but in test results says high. Talked to an on call doctor who says may need to do more tests. Robaxin helped but then it didn't doctor changed over to Tramadol but low dosage so going call to change dosage.  Therapist cautioned patient on opiates and benzodiazepine and patient shares doctor dropped dosage of Trazene and asked why not doing anything about the pain. Therapist was glad doctor said something as she feels the same way. Talked about bad experience with getting medical treatment and doctor was surprised. Psychiatrist tried meds for pain amitriptyline stopped numbness and tingling in toes not talking as stopped helping -came back and appears not to have helped with pain.  Noted impact of pain includes she doesn't really have daily life. Tries to do physical therapy when they are here and not here at least twice a day. Supposed to be three times a day and will do that too. Has to do it seated, standing and laying down. Doing it hopes in the long run it will help. Family keeps going and prayer. Talks a lot with sister. Lives with her sometimes. Doesn't want to go all weekend feeling like this. Did cook last week and this week. Therapist was glad she is doing these kind of things and patient says she says she is too. Force herself to do it. Get out of breath fast. Therapist was actually Impressed pushing through the pain at the same time good for her.   Talked about ongoing pain issues and therapist wanting patient to find some  relief if she finds relief she can engage in some activities to help her with mental health.  Therapist shared some of her own activities and believes patient would enjoy these type of creative activities what ever she is drawn to.  Therapist shared she was glad that another doctor spoke up  about wanting her pain issues to be addressed validating what therapist also thinks.  Therapist shared she reviewed doctors message having patient explained it a little bit more.  She is going to asked to have pain medicine increase and therapist was glad patient was addressing the issue also glad patient asked doctor to decrease benzo. Hurts patient when she pushes herself even though it is difficult such as cooking doing physical therapy therapist even impressed patient pushing through to do positive activities when it so difficult.  Noted very helpful for her as her family key for for helping cope as well as prayer.  Therapist talked about limited in she can do for physical issues but patient insists very helpful to have sessions.  Therapist provided space and support for patient to talk about thoughts and feelings in session. Suicidal/Homicidal: No  Plan: Return again in 2 weeks.2.Find topics that help with mood and are distracting from patient's pain, continue to provide supportive as well as problem solving strategies to help patient with frustrations and roadblocks to medical care  Diagnosis: Major depressive disorder, recurrent, moderate, generalized anxiety disorder, chronic PTSD, panic attacks  Collaboration of Care: Other review of phone conversation with primary care doctor to get update as to treatment for patient's pain issues  Patient/Guardian was advised Release of Information must be obtained prior to any record release in order to collaborate their care with an outside provider. Patient/Guardian was advised if they have not already done so to contact the registration department to sign all necessary forms in order for [name] to release information regarding their care.   Consent: Patient/Guardian gives verbal consent for treatment and assignment of benefits for services provided during this visit. Patient/Guardian expressed understanding and agreed to proceed.   Lindsay Register,  LCSW 07/27/2021

## 2021-07-30 NOTE — Telephone Encounter (Signed)
Call to patient given an appointment for 08/06/2021 at 9:45 AM to see Dr. Alfonse Spruce.

## 2021-07-31 DIAGNOSIS — Z8673 Personal history of transient ischemic attack (TIA), and cerebral infarction without residual deficits: Secondary | ICD-10-CM | POA: Diagnosis not present

## 2021-07-31 DIAGNOSIS — D631 Anemia in chronic kidney disease: Secondary | ICD-10-CM | POA: Diagnosis not present

## 2021-07-31 DIAGNOSIS — K219 Gastro-esophageal reflux disease without esophagitis: Secondary | ICD-10-CM | POA: Diagnosis not present

## 2021-07-31 DIAGNOSIS — J9611 Chronic respiratory failure with hypoxia: Secondary | ICD-10-CM | POA: Diagnosis not present

## 2021-07-31 DIAGNOSIS — E559 Vitamin D deficiency, unspecified: Secondary | ICD-10-CM | POA: Diagnosis not present

## 2021-07-31 DIAGNOSIS — Z87891 Personal history of nicotine dependence: Secondary | ICD-10-CM | POA: Diagnosis not present

## 2021-07-31 DIAGNOSIS — S83242D Other tear of medial meniscus, current injury, left knee, subsequent encounter: Secondary | ICD-10-CM | POA: Diagnosis not present

## 2021-07-31 DIAGNOSIS — G8929 Other chronic pain: Secondary | ICD-10-CM | POA: Diagnosis not present

## 2021-07-31 DIAGNOSIS — G4733 Obstructive sleep apnea (adult) (pediatric): Secondary | ICD-10-CM | POA: Diagnosis not present

## 2021-07-31 DIAGNOSIS — M545 Low back pain, unspecified: Secondary | ICD-10-CM | POA: Diagnosis not present

## 2021-07-31 DIAGNOSIS — I509 Heart failure, unspecified: Secondary | ICD-10-CM | POA: Diagnosis not present

## 2021-07-31 DIAGNOSIS — N189 Chronic kidney disease, unspecified: Secondary | ICD-10-CM | POA: Diagnosis not present

## 2021-07-31 DIAGNOSIS — E1122 Type 2 diabetes mellitus with diabetic chronic kidney disease: Secondary | ICD-10-CM | POA: Diagnosis not present

## 2021-07-31 DIAGNOSIS — K589 Irritable bowel syndrome without diarrhea: Secondary | ICD-10-CM | POA: Diagnosis not present

## 2021-07-31 DIAGNOSIS — E1140 Type 2 diabetes mellitus with diabetic neuropathy, unspecified: Secondary | ICD-10-CM | POA: Diagnosis not present

## 2021-07-31 DIAGNOSIS — I130 Hypertensive heart and chronic kidney disease with heart failure and stage 1 through stage 4 chronic kidney disease, or unspecified chronic kidney disease: Secondary | ICD-10-CM | POA: Diagnosis not present

## 2021-08-01 ENCOUNTER — Other Ambulatory Visit: Payer: Self-pay | Admitting: Internal Medicine

## 2021-08-01 DIAGNOSIS — E119 Type 2 diabetes mellitus without complications: Secondary | ICD-10-CM

## 2021-08-01 DIAGNOSIS — M25562 Pain in left knee: Secondary | ICD-10-CM

## 2021-08-01 NOTE — Telephone Encounter (Signed)
Next appt scheduled 7/10 with Dr Alfonse Spruce.

## 2021-08-03 ENCOUNTER — Other Ambulatory Visit: Payer: Self-pay | Admitting: Pulmonary Disease

## 2021-08-06 ENCOUNTER — Encounter: Payer: Self-pay | Admitting: Student

## 2021-08-06 ENCOUNTER — Other Ambulatory Visit: Payer: Self-pay

## 2021-08-06 ENCOUNTER — Ambulatory Visit (INDEPENDENT_AMBULATORY_CARE_PROVIDER_SITE_OTHER): Payer: Medicare Other | Admitting: Student

## 2021-08-06 DIAGNOSIS — M549 Dorsalgia, unspecified: Secondary | ICD-10-CM | POA: Diagnosis not present

## 2021-08-06 DIAGNOSIS — M797 Fibromyalgia: Secondary | ICD-10-CM

## 2021-08-06 DIAGNOSIS — M79605 Pain in left leg: Secondary | ICD-10-CM | POA: Diagnosis not present

## 2021-08-06 DIAGNOSIS — G8929 Other chronic pain: Secondary | ICD-10-CM

## 2021-08-06 DIAGNOSIS — R110 Nausea: Secondary | ICD-10-CM | POA: Diagnosis not present

## 2021-08-06 DIAGNOSIS — M79604 Pain in right leg: Secondary | ICD-10-CM | POA: Diagnosis not present

## 2021-08-06 DIAGNOSIS — Z6841 Body Mass Index (BMI) 40.0 and over, adult: Secondary | ICD-10-CM

## 2021-08-06 MED ORDER — PROMETHAZINE HCL 12.5 MG PO TABS: 12.5000 mg | ORAL_TABLET | Freq: Four times a day (QID) | ORAL | 0 refills | Status: DC | PRN

## 2021-08-06 MED ORDER — TRAMADOL HCL 100 MG PO TABS: 100.0000 mg | ORAL_TABLET | Freq: Three times a day (TID) | ORAL | 0 refills | Status: DC

## 2021-08-06 MED ORDER — METHOCARBAMOL 750 MG PO TABS: 1500.0000 mg | ORAL_TABLET | Freq: Three times a day (TID) | ORAL | 1 refills | Status: DC | PRN

## 2021-08-06 NOTE — Assessment & Plan Note (Signed)
Please see fibromyalgia note tab

## 2021-08-06 NOTE — Progress Notes (Addendum)
CC: Pain assessment   HPI:  [name] is a 51 y.o. with past medical history of hypertension, fibromyalgia, chronic knee pain and back pain, OSA, chronic respiratory failure with hypoxemia who presents to the clinic today for chronic pain assessment.  Please see problem based charting for detail  Past Medical History:  Diagnosis Date   Acid reflux    Anemia    Iron Def   Anorexia    CHF (congestive heart failure) ([facility])    Chronic kidney disease    Nephrotic syndrome   Colon polyp 2009   Depression with anxiety    Edema leg    Fibromyalgia    Hemorrhoids    Hidradenitis suppurativa    Hypertension    IBS (irritable bowel syndrome)    Low back pain    Migraines    Morbidly obese (HCC)    Neuromuscular disorder (HCC)    fibromyalgia   Neuropathy    Panic attacks    Polyp of vocal cord or larynx    Tonsil pain    Review of Systems:  per HPI  Physical Exam:  Vitals:   08/06/21 0922  BP: 132/82  Pulse: 95  Temp: 98.6 F (37 C)  TempSrc: Oral  SpO2: 95%  Weight: (!) 368 lb 1.6 oz (167 kg)  Height: '5\' 11"'$  (1.803 m)   Physical Exam Constitutional:      General: She is in acute distress.     Appearance: She is obese.     Comments: Patient is uncomfortable and constantly shifting position  Eyes:     General:        Right eye: No discharge.        Left eye: No discharge.     Conjunctiva/sclera: Conjunctivae normal.  Cardiovascular:     Rate and Rhythm: Normal rate and regular rhythm.  Pulmonary:     Effort: Pulmonary effort is normal. No respiratory distress.     Breath sounds: Normal breath sounds.  Musculoskeletal:     Comments: Tenderness to palpation in the lower lumbar and bilateral paraspinal muscle.  4/5 grip strength bilaterally but mostly due to lassitude and pain.  Good resistance with shoulder strength test. Bilateral knees symmetrical in appearance.  No joint effusion.  Left knee felt slightly warm to touch.  Limited range of motion due to  pain.  Skin:    General: Skin is warm.  Neurological:     Mental Status: She is alert and oriented to person, place, and time.      Assessment & Plan:   See Encounters Tab for problem based charting.  Fibromyalgia Patient is here for pain assessment for her chronic fibromyalgia (thigh muscles), bilateral knee pain and lower back pain.  Patient was started on tramadol 50 mg every 6 as needed a few weeks ago and states that pain did not improve at all.  She has stopped Tylenol and Robaxin.  States that she fell out of bed on Sunday and now lower back pain is worsened.  Denies any change in her lower extremities, no bowel or urinary incontinence.  She reports normal strength of her shoulder and able to raise arms overhead.  She has complicated medical issues including fibromyalgia, bilateral knee pain and lower back pain.  Regarding fibromyalgia, sed rate and CRP was mildly elevated last visit.  No leukocytosis on CBC.  ANA and RA test was negative in 2018.  I have low suspicion for PMR at this time given preserved strength of bilateral  shoulder strength.  Her pain mostly localized in bilateral thigh, knees and back.  She has failed gabapentin, Lyrica, Cymbalta, amitriptyline and tizanidine.  She is working with physical therapy but pain is a limiting factor.  Regarding bilateral knee pain, she just had bilateral MRI in December 2022.  Right knee MRI was unremarkable.  Left knee MRI showed a nondisplaced tear of the lateral meniscus but otherwise no other ligamental damage.  She has had steroid injection in January 2021.  Regarding her lower back pain, MRI in 2020 was unremarkable.  She received steroid injection in 2021 Dr. Mina Marble.  Overall, I think the underlying cause of her pain is her weight which places a lot of pressure on her back and knees.  She has no obvious structural causes that required surgical interventions.  We discussed bariatric surgery as an option for weight loss.  She voices  concern about surgical complication but agrees with talking to a Psychologist, sport and exercise.  She is currently taking Ozempic 2 mg weekly but endorses GI side effects including stomach upset and bad taste in her mouth.  She is currently taking omeprazole and Pepcid already.  She does not wish to lower her Ozempic dose because it is helping her lose weight.  -Regarding tramadol, will increase her dose to 100 mg every 8 hours.  We signed a pain contract today and went over expectations.  New prescription sent starting 08/16/21. -Her ORT score of 5 which suggest more frequent monitoring.  We will reassess her pain in 2-4-week. -Refill Robaxin 1500 milligram every 8 hours as needed -Advised patient to take Tylenol 1 g every 8 hours as needed -Continue physical therapy -Referral to bariatric surgery placed -Refill short course of Phenergan for her GI upset.  Chronic back pain Please see fibromyalgia note tab  Chronic leg pain Please see fibromyalgia note   Patient discussed with Dr. Dareen Piano

## 2021-08-06 NOTE — Patient Instructions (Addendum)
Lindsay [name],  It was a pleasure seeing you in the clinic today.  I am sorry that you are in so much discomfort.  -Please start taking tramadol 100 mg every 8 hours.  I sent in a new prescription starting on 08/16/2021.  We signed a pain contract today. -Continue Robaxin 1500 mg (2 tablets) every 8 hours as needed. -Please take Tylenol 1000 mg every 8 hours as needed. -I placed a referral to bariatric surgery. -Please continue working with physical therapy. -You can take Phenergan as needed for GI upset and nausea.  This is likely a side effect from Ozempic.  Please return in 2-4 weeks for pain reassessment.  Take care,  Dr. Alfonse Spruce

## 2021-08-06 NOTE — Assessment & Plan Note (Addendum)
Patient is here for pain assessment for her chronic fibromyalgia (thigh muscles), bilateral knee pain and lower back pain.  Patient was started on tramadol 50 mg every 6 as needed a few weeks ago and states that pain did not improve at all.  She has stopped Tylenol and Robaxin.  States that she fell out of bed on Sunday and now lower back pain is worsened.  Denies any change in her lower extremities, no bowel or urinary incontinence.  She reports normal strength of her shoulder and able to raise arms overhead.  She has complicated medical issues including fibromyalgia, bilateral knee pain and lower back pain.  Regarding fibromyalgia, sed rate and CRP was mildly elevated last visit.  No leukocytosis on CBC.  ANA and RA test was negative in 2018.  I have low suspicion for PMR at this time given preserved strength of bilateral shoulder strength.  Her pain mostly localized in bilateral thigh, knees and back.  She has failed gabapentin, Lyrica, Cymbalta, amitriptyline and tizanidine.  She is working with physical therapy but pain is a limiting factor.  Regarding bilateral knee pain, she just had bilateral MRI in December 2022.  Right knee MRI was unremarkable.  Left knee MRI showed a nondisplaced tear of the lateral meniscus but otherwise no other ligamental damage.  She has had steroid injection in January 2021.  Regarding her lower back pain, MRI in 2020 was unremarkable.  She received steroid injection in 2021 Dr. Mina Marble.  Overall, I think the underlying cause of her pain is her weight which places a lot of pressure on her back and knees.  She has no obvious structural causes that required surgical interventions.  We discussed bariatric surgery as an option for weight loss.  She voices concern about surgical complication but agrees with talking to a Psychologist, sport and exercise.  She is currently taking Ozempic 2 mg weekly but endorses GI side effects including stomach upset and bad taste in her mouth.  She is currently taking  omeprazole and Pepcid already.  She does not wish to lower her Ozempic dose because it is helping her lose weight.  -Regarding tramadol, will increase her dose to 100 mg every 8 hours.  We signed a pain contract today and went over expectations.  New prescription sent starting 08/16/21. -Her ORT score of 5 which suggest more frequent monitoring.  We will reassess her pain in 2-4-week. -Refill Robaxin 1500 milligram every 8 hours as needed -Advised patient to take Tylenol 1 g every 8 hours as needed -Continue physical therapy -Referral to bariatric surgery placed -Refill short course of Phenergan for her GI upset.

## 2021-08-06 NOTE — Assessment & Plan Note (Signed)
Please see fibromyalgia note

## 2021-08-08 ENCOUNTER — Other Ambulatory Visit: Payer: Self-pay | Admitting: Pulmonary Disease

## 2021-08-08 DIAGNOSIS — R059 Cough, unspecified: Secondary | ICD-10-CM

## 2021-08-08 NOTE — Progress Notes (Signed)
Internal Medicine Clinic Attending  Case discussed with Dr. Nguyen  At the time of the visit.  We reviewed the resident's history and exam and pertinent patient test results.  I agree with the assessment, diagnosis, and plan of care documented in the resident's note. 

## 2021-08-10 ENCOUNTER — Ambulatory Visit: Payer: Medicare Other

## 2021-08-10 ENCOUNTER — Ambulatory Visit (INDEPENDENT_AMBULATORY_CARE_PROVIDER_SITE_OTHER): Payer: Medicare Other | Admitting: Licensed Clinical Social Worker

## 2021-08-10 DIAGNOSIS — F4312 Post-traumatic stress disorder, chronic: Secondary | ICD-10-CM | POA: Diagnosis not present

## 2021-08-10 DIAGNOSIS — F410 Panic disorder [episodic paroxysmal anxiety] without agoraphobia: Secondary | ICD-10-CM | POA: Diagnosis not present

## 2021-08-10 DIAGNOSIS — F411 Generalized anxiety disorder: Secondary | ICD-10-CM | POA: Diagnosis not present

## 2021-08-10 DIAGNOSIS — F331 Major depressive disorder, recurrent, moderate: Secondary | ICD-10-CM

## 2021-08-10 NOTE — Progress Notes (Signed)
Virtual Visit via Video Note  I connected with Lindsay [name] on 08/10/21 at  8:00 AM EDT by a video enabled telemedicine application and verified that I am speaking with the correct person using two identifiers.  Location: Patient: home Provider: home office   I discussed the limitations of evaluation and management by telemedicine and the availability of in person appointments. The patient expressed understanding and agreed to proceed.   I discussed the assessment and treatment plan with the patient. The patient was provided an opportunity to ask questions and all were answered. The patient agreed with the plan and demonstrated an understanding of the instructions.   The patient was advised to call back or seek an in-person evaluation if the symptoms worsen or if the condition fails to improve as anticipated.  I provided 30 minutes of non-face-to-face time during this encounter.  THERAPIST PROGRESS NOTE  Session Time: 8:00 AM to 8:30 AM  Participation Level: Active  Behavioral Response: CasualAlertDysphoric  Type of Therapy: Individual Therapy  Treatment Goals addressed: Patient continues to find therapy helpful for supportive and strength-based interventions as well as continue to focus on seeking help and coping with medical issues, anxiety, triggers, coping, utilize therapy as a way for patient to focus on working through current stressors that also helps to distract from pain  ProgressTowards Goals: Progressing-patient utilizes therapy to help with coping and therapist assesses patient benefits from sessions and processing feelings managing stressors  Interventions: Solution Focused, Play Therapy, Supportive, and Other: coping  Summary: Lindsay [name] is a 51 y.o. female who presents with haven't been to sleep yet in a lot of discomfort. They increased Tramadol and told her to take the muscle relaxant. So taking Robaxin Tylenol pain meds.  Does not want to take them at the same  time concerned about reaction described dystonic reaction could have so has to be careful. Described to therapist what that is. She relates it is like a seizure and stroke at same time tongue goes to back of mouth have to carry an Epipen.  Therapist shared her impression that it seems like status quo hurting worse fell off the bed. Going to have her sign a drug contract random drug test patient is fine with that and wants to change medications. Went to doctor on 10 and probably going back in 2 and not wait 4. Fell and body felt jolted like a car accident.  Therapist reviewed with patient again what doctors think is going on and she says fibromyalgia, tear in  knee an lower back pain. Plan to get pain down and to point where can function.  Talked about bariatric surgery doctors talking to her about it patient says risky and scary.  Patient has other medical issues which need to be considered COPD and congestive heart failure.  Therapist feedback was need to have someone knows with her doing recognizes some of her own health issues related to weight so there is some things to be gained but something to really look into and have somebody who knows what you are doing. Patient shares tired and in pain. Feels therapy really helps so frustrated and therapy gives her outlet to talk to somebody who has been following along gets what she is talking about. Therapist provided example like a pressure cooker and has a place to release some of the pressure at the same time have the space and support in session to talk about thoughts and feelings, therapist providing validation and empathy for the barriers she  has to deal with in getting help.  Suicidal/Homicidal: No  Plan: Return again in 2 weeks.  Diagnosis: Major depressive disorder, recurrent, moderate, generalized anxiety disorder, chronic PTSD, panic attacks  Collaboration of Care: Other none needed  Patient/Guardian was advised Release of Information must be  obtained prior to any record release in order to collaborate their care with an outside provider. Patient/Guardian was advised if they have not already done so to contact the registration department to sign all necessary forms in order for [name] to release information regarding their care.   Consent: Patient/Guardian gives verbal consent for treatment and assignment of benefits for services provided during this visit. Patient/Guardian expressed understanding and agreed to proceed.   [name], LCSW 08/10/2021

## 2021-08-14 ENCOUNTER — Ambulatory Visit (INDEPENDENT_AMBULATORY_CARE_PROVIDER_SITE_OTHER): Payer: Medicare Other

## 2021-08-14 DIAGNOSIS — Z0000 Encounter for general adult medical examination without abnormal findings: Secondary | ICD-10-CM

## 2021-08-14 NOTE — Progress Notes (Signed)
Subjective:   Lindsay [name] is a 51 y.o. female who presents for an Initial Medicare Annual Wellness Visit. I connected with  [name] on 10/12/21 by a video and audio enabled telemedicine application and verified that I am speaking with the correct person using two identifiers.  Patient Location: Home  Provider Location: Office/Clinic  I discussed the limitations of evaluation and management by telemedicine. The patient expressed understanding and agreed to proceed.   Review of Systems    Deferred to PCP  Cardiac Risk Factors include: advanced age (>58mn, >>28women);diabetes mellitus;dyslipidemia;hypertension     Objective:    Today's Vitals   08/14/21 1040  PainSc: 10-Worst pain ever   There is no height or weight on file to calculate BMI.     09/26/2021   10:03 AM 09/12/2021    9:42 AM 08/14/2021   10:50 AM 07/18/2021    8:43 AM 05/23/2021    9:17 AM 11/08/2020    9:05 AM 10/07/2020   12:18 AM  Advanced Directives  Does Patient Have a Medical Advance Directive? _0  No No  Would patient like information on creating a medical advance directive? No - Patient declined No - Patient declined No - Patient declined No - Patient declined No - Patient declined No - Patient declined     Current Medications (verified) Outpatient Encounter Medications as of 08/14/2021  Medication Sig   ACCU-CHEK GUIDE test strip USE ONE TEST STRIP TO CHECK BLOOD SUGARS ONCE A DAY AS NEEDED   Accu-Chek Softclix Lancets lancets USE ONE LANCET TO CHECK BLOOD SUGAR ONE A DAY AS NEEDED   ADDYI 100 MG TABS TAKE 1 TABLET BY MOUTH EVERY DAY   albuterol (PROVENTIL) (2.5 MG/3ML) 0.083% nebulizer solution TAKE 3 ML (2.5 MG TOTAL) BY NEBULIZATION EVERY 4 HOURS AS NEEDED FOR WHEEZING OR SHORTNESS OF BREATH   albuterol (VENTOLIN HFA) 108 (90 Base) MCG/ACT inhaler TAKE 2 PUFFS BY MOUTH EVERY 6 HOURS AS NEEDED FOR WHEEZE OR SHORTNESS OF BREATH   Ascorbic Acid (VITAMIN C PO) Take 1 tablet by mouth  daily.   atorvastatin (LIPITOR) 40 MG tablet Take 1 tablet (40 mg total) by mouth daily.   Blood Glucose Monitoring Suppl (ACCU-CHEK GUIDE ME) w/Device KIT Dispense one device   Capsaicin 0.025 % PTCH Apply 1 patch topically daily as needed.   cetirizine (ZYRTEC) 10 MG tablet TAKE 1 TABLET BY MOUTH EVERY DAY   clindamycin (CLINDAGEL) 1 % gel APPLY TO AFFECTED AREA TWICE A DAY   ENTRESTO 49-51 MG Take 1 tablet by mouth 2 (two) times daily.   ferrous sulfate 325 (65 FE) MG EC tablet Take 1 tablet (325 mg total) by mouth daily with breakfast.   ipratropium (ATROVENT) 0.06 % nasal spray PLEASE SEE ATTACHED FOR DETAILED DIRECTIONS   lidocaine (XYLOCAINE) 5 % ointment Apply 1 application topically as needed for mild pain.   megestrol (MEGACE) 40 MG tablet Take 2 tablets (80 mg total) by mouth daily.   metFORMIN (GLUCOPHAGE-XR) 500 MG 24 hr tablet TAKE 2 TABLETS BY MOUTH EVERY DAY WITH BREAKFAST   metoprolol succinate (TOPROL-XL) 50 MG 24 hr tablet Take 1 tablet (50 mg total) by mouth daily.   SYMBICORT 160-4.5 MCG/ACT inhaler INHALE 2 PUFFS INTO THE LUNGS IN THE MORNING AND AT BEDTIME.   vitamin B-12 (CYANOCOBALAMIN) 1000 MCG tablet Take 1 tablet (1,000 mcg total) by mouth daily.   [DISCONTINUED] Brexpiprazole (REXULTI) 3 MG TABS Take 1 tablet (3 mg total) by mouth  daily.   [DISCONTINUED] cholecalciferol (VITAMIN D) 25 MCG (1000 UNIT) tablet TAKE 1 TABLET BY MOUTH EVERY DAY   [DISCONTINUED] clorazepate (TRANXENE) 3.75 MG tablet Take 1 tablet (3.75 mg total) by mouth daily.   [DISCONTINUED] diclofenac Sodium (VOLTAREN) 1 % GEL APPLY 2 GRAMS TOPICALLY 4 (FOUR) TIMES DAILY AS NEEDED FOR KNEE PAIN   [DISCONTINUED] famotidine (PEPCID) 20 MG tablet TAKE 1 TABLET BY MOUTH EVERY DAY   [DISCONTINUED] lamoTRIgine (LAMICTAL) 150 MG tablet Take 1 tablet (150 mg total) by mouth daily.   [DISCONTINUED] lidocaine (LIDODERM) 5 % Place 2 patches onto the skin daily. Remove & Discard patch within 12 hours or as  directed by MD   [DISCONTINUED] meclizine (ANTIVERT) 25 MG tablet TAKE 1 TABLET BY MOUTH 3 TIMES A DAY AS NEEDED FOR DIZZINESS   [DISCONTINUED] methocarbamol (ROBAXIN) 750 MG tablet Take 2 tablets (1,500 mg total) by mouth every 8 (eight) hours as needed for muscle spasms.   [DISCONTINUED] mirtazapine (REMERON) 30 MG tablet Take 1 tablet (30 mg total) by mouth at bedtime.   [DISCONTINUED] omeprazole (PRILOSEC) 40 MG capsule TAKE 1 CAPSULE (40 MG TOTAL) BY MOUTH DAILY.   [DISCONTINUED] ondansetron (ZOFRAN-ODT) 8 MG disintegrating tablet Take 1 tablet (8 mg total) by mouth every 8 (eight) hours as needed.   [DISCONTINUED] promethazine (PHENERGAN) 12.5 MG tablet Take 1 tablet (12.5 mg total) by mouth every 6 (six) hours as needed.   [DISCONTINUED] Semaglutide, 2 MG/DOSE, (OZEMPIC, 2 MG/DOSE,) 8 MG/3ML SOPN Inject 2 mg into the skin once a week.   [DISCONTINUED] torsemide (DEMADEX) 20 MG tablet TAKE 2 TABLETS BY MOUTH TWICE A DAY   [DISCONTINUED] traMADol (ULTRAM) 50 MG tablet Take 1 tablet (50 mg total) by mouth every 6 (six) hours as needed.   [DISCONTINUED] traMADol HCl 100 MG TABS Take 100 mg by mouth every 8 (eight) hours.   Vitamin E 400 units TABS Take 400 Units by mouth daily.    No facility-administered encounter medications on file as of 08/14/2021.    Allergies (verified) Lisinopril   History: Past Medical History:  Diagnosis Date   Acid reflux    Anemia    Iron Def   Anorexia    CHF (congestive heart failure) (HCC)    Chronic kidney disease    Nephrotic syndrome   Colon polyp 2009   Depression with anxiety    Edema leg    Fibromyalgia    Hemorrhoids    Hidradenitis suppurativa    Hypertension    IBS (irritable bowel syndrome)    Low back pain    Migraines    Morbidly obese (HCC)    Neuromuscular disorder (HCC)    fibromyalgia   Neuropathy    Panic attacks    Polyp of vocal cord or larynx    Tonsil pain    Past Surgical History:  Procedure Laterality Date    AXILLARY HIDRADENITIS EXCISION     COLONOSCOPY WITH PROPOFOL N/A 05/25/2015   Procedure: COLONOSCOPY WITH PROPOFOL;  Surgeon: Milus Banister, MD;  Location: [facility];  Service: Endoscopy;  Laterality: N/A;   COLONOSCOPY WITH PROPOFOL N/A 12/31/2018   Procedure: COLONOSCOPY WITH PROPOFOL;  Surgeon: Milus Banister, MD;  Location: [facility];  Service: Endoscopy;  Laterality: N/A;   HEMORRHOID SURGERY     with Hidradenitis surgery    INGUINAL HIDRADENITIS EXCISION     TONSILLECTOMY  10/18/2010   by Dr. Wilburn Cornelia   UPPER GASTROINTESTINAL ENDOSCOPY     Family History  Problem Relation  Age of Onset   Diabetes Mother    Heart disease Mother        valve leak   Anxiety disorder Mother    Depression Mother    High blood pressure Mother    Kidney failure Mother    Cancer Father        prostate   Heart disease Father    Learning disabilities Sister    Depression Sister    Depression Sister    Anxiety disorder Sister    Colon cancer Neg Hx    Social History   Socioeconomic History   Marital status: Single    Spouse name: Not on file   Number of children: 1   Years of education: 11   Highest education level: Not on file  Occupational History   Occupation: Disabled  Tobacco Use   Smoking status: Former    Packs/day: 0.10    Years: 25.00    Total pack years: 2.50    Types: Cigarettes    Quit date: 05/24/2019    Years since quitting: 2.3   Smokeless tobacco: Never   Tobacco comments:    quit in April.  Vaping Use   Vaping Use: Never used  Substance and Sexual Activity   Alcohol use: Not Currently    Alcohol/week: 0.0 standard drinks of alcohol    Comment: none sine 05/2018   Drug use: Not Currently    Frequency: 3.0 times per week    Types: Marijuana   Sexual activity: Yes    Birth control/protection: Implant  Other Topics Concern   Not on file  Social History Narrative   Lives at home w/ her mother and daughter   Right-handed   Caffeine: about 3 Cokes per  week   Social Determinants of Health   Financial Resource Strain: High Risk (08/14/2021)   Overall Financial Resource Strain (CARDIA)    Difficulty of Paying Living Expenses: Very hard  Food Insecurity: Food Insecurity Present (08/14/2021)   Hunger Vital Sign    Worried About Running Out of Food in the Last Year: Often true    Ran Out of Food in the Last Year: Often true  Transportation Needs: Unmet Transportation Needs (08/14/2021)   PRAPARE - Transportation    Lack of Transportation (Medical): Yes    Lack of Transportation (Non-Medical): Yes  Physical Activity: Insufficiently Active (08/14/2021)   Exercise Vital Sign    Days of Exercise per Week: 1 day    Minutes of Exercise per Session: 40 min  Stress: No Stress Concern Present (08/14/2021)   [place]    Feeling of Stress : Only a little  Social Connections: Moderately Isolated (08/14/2021)   Social Connection and Isolation Panel [NHANES]    Frequency of Communication with Friends and Family: Never    Frequency of Social Gatherings with Friends and Family: More than three times a week    Attends Religious Services: 1 to 4 times per year    Active Member of [employer] or Organizations: No    Attends Archivist Meetings: Never    Marital Status: Never married    Tobacco Counseling Counseling given: Not Answered Tobacco comments: quit in April.   Clinical Intake:  Pre-visit preparation completed: Yes  Pain : 0-10 Pain Score: 10-Worst pain ever Pain Type: Chronic pain Pain Location: Back Pain Orientation: Lower Pain Descriptors / Indicators: Constant Pain Onset: Other (comment)     Diabetes: Yes Did pt. bring in CBG  monitor from home?: No  How often do you need to have someone help you when you read instructions, pamphlets, or other written materials from your doctor or pharmacy?: 1 - Never What is the last grade level you completed in school?: 11  GRADE  Diabetic?yes   Interpreter Needed?: No  Information entered by :: [facility] Shaquoya Cosper   Activities of Daily Living    09/26/2021   10:03 AM 09/12/2021    9:41 AM  In your present state of health, do you have any difficulty performing the following activities:  Hearing? 0 0  Vision? 0 0  Difficulty concentrating or making decisions? 1 1  Comment  Sometimes.  Walking or climbing stairs? 1 1  Dressing or bathing? 1 1  Doing errands, shopping? 1 1    Patient Care Team: Velna Ochs, MD as PCP - General (Internal Medicine) Debara Pickett Nadean Corwin, MD as PCP - Cardiology (Cardiology) Drema Pry as Social Worker  Indicate any recent Medical Services you may have received from other than [facility] providers in the past year (date may be approximate).     Assessment:   This is a routine wellness examination for Shantoya.  Hearing/Vision screen No results found.  Dietary issues and exercise activities discussed: Current Exercise Habits: Home exercise routine, Type of exercise: Other - see comments (PT HAS PT TO COME TO HER HOME ONCE A WEEK TO DO THERAPY), Time (Minutes): 45, Frequency (Times/Week): 1, Weekly Exercise (Minutes/Week): 45, Intensity: Mild, Exercise limited by: None identified   Goals Addressed   None   Depression Screen    09/26/2021   10:03 AM 09/12/2021   10:12 AM 08/14/2021   10:43 AM 08/06/2021   11:27 AM 07/18/2021    8:43 AM 05/23/2021   10:14 AM 11/08/2020   10:43 AM  PHQ 2/9 Scores  PHQ - 2 Score _0 PHQ- 9 Score _1 Fall Risk    09/26/2021   10:02 AM 09/12/2021    9:41 AM 08/14/2021   10:50 AM 08/06/2021    9:30 AM 07/18/2021    8:42 AM  Fall Risk   Falls in the past year? _2 0  Comment    FELL OUT BED   Number falls in past yr: 0 0 0 1 0  Injury with Fall? _3 0  Comment    RIGHT KNEE CAP BRUISED   Risk for fall due to : History of fall(s);Impaired balance/gait;Impaired mobility Impaired balance/gait;History  of fall(s) Impaired balance/gait Impaired balance/gait;Impaired mobility Impaired balance/gait;Impaired mobility  Follow up Falls evaluation completed;Falls prevention discussed Falls evaluation completed;Falls prevention discussed Falls evaluation completed;Falls prevention discussed Falls evaluation completed Falls evaluation completed;Falls prevention discussed    FALL RISK PREVENTION PERTAINING TO THE HOME:  Any stairs in or around the home? No  If so, are there any without handrails? No  Home free of loose throw rugs in walkways, pet beds, electrical cords, etc? No  Adequate lighting in your home to reduce risk of falls? Yes   ASSISTIVE DEVICES UTILIZED TO PREVENT FALLS:  Life alert? No  Use of a cane, walker or w/c? Yes  Grab bars in the bathroom? No  Shower chair or bench in shower? No  Elevated toilet seat or a handicapped toilet? No   TIMED UP AND GO:  Was the test performed? No .  Length of time to ambulate 10 feet: N/A sec.  Cognitive Function:        08/14/2021   10:54 AM  6CIT Screen  What Year? 0 points  What month? 0 points  What time? 0 points  Count back from 20 0 points  Months in reverse 0 points  Repeat phrase 0 points  Total Score 0 points    Immunizations Immunization History  Administered Date(s) Administered   Influenza Split 11/27/2010   Influenza Whole 11/23/2008   Influenza,inj,Quad PF,6+ Mos 10/20/2014, 10/09/2015, 11/04/2016, 11/09/2017, 11/18/2018, 11/26/2019, 11/08/2020   PFIZER(Purple Top)SARS-COV-2 Vaccination 04/17/2019, 05/08/2019, 03/28/2020   Pneumococcal Polysaccharide-23 08/12/2018, 11/26/2019    TDAP status: Due, Education has been provided regarding the importance of this vaccine. Advised may receive this vaccine at local pharmacy or Health Dept. Aware to provide a copy of the vaccination record if obtained from local pharmacy or Health Dept. Verbalized acceptance and understanding.  Flu Vaccine status: Up to  date  Pneumococcal vaccine status: Due, Education has been provided regarding the importance of this vaccine. Advised may receive this vaccine at local pharmacy or Health Dept. Aware to provide a copy of the vaccination record if obtained from local pharmacy or Health Dept. Verbalized acceptance and understanding.  Covid-19 vaccine status: Completed vaccines  Qualifies for Shingles Vaccine? Yes   Zostavax completed No   Shingrix Completed?: No.    Education has been provided regarding the importance of this vaccine. Patient has been advised to call insurance company to determine out of pocket expense if they have not yet received this vaccine. Advised may also receive vaccine at local pharmacy or Health Dept. Verbalized acceptance and understanding.  Screening Tests Health Maintenance  Topic Date Due   Zoster Vaccines- Shingrix (1 of 2) Never done   OPHTHALMOLOGY EXAM  09/08/2019   TETANUS/TDAP  05/02/2020   COVID-19 Vaccine (4 - Pfizer risk series) 05/23/2020   MAMMOGRAM  01/24/2021   INFLUENZA VACCINE  08/28/2021   PAP SMEAR-Modifier  11/17/2021   FOOT EXAM  11/08/2021   HEMOGLOBIN A1C  03/15/2022   Diabetic kidney evaluation - Urine ACR  05/15/2022   Diabetic kidney evaluation - GFR measurement  07/19/2022   COLONOSCOPY (Pts 45-62yr Insurance coverage will need to be confirmed)  12/30/2028   Hepatitis C Screening  Completed   HIV Screening  Completed   HPV VACCINES  Aged Out    Health Maintenance  Health Maintenance Due  Topic Date Due   Zoster Vaccines- Shingrix (1 of 2) Never done   OPHTHALMOLOGY EXAM  09/08/2019   TETANUS/TDAP  05/02/2020   COVID-19 Vaccine (4 - Pfizer risk series) 05/23/2020   MAMMOGRAM  01/24/2021   INFLUENZA VACCINE  08/28/2021   PAP SMEAR-Modifier  11/17/2021    Colorectal cancer screening: Type of screening: Colonoscopy. Completed 12/31/2018. Repeat every 10 years  Mammogram status: Completed 12/282020. Repeat every  2 year   Lung Cancer  Screening: (Low Dose CT Chest recommended if Age 52-80years, 30 pack-year currently smoking OR have quit w/in 15years.) does not qualify.   Lung Cancer Screening Referral: DEFERRED TO PCP   Additional Screening:  Hepatitis C Screening: does qualify; Completed 02/02/2020  Vision Screening: Recommended annual ophthalmology exams for early detection of glaucoma and other disorders of the eye. Is the patient up to date with their annual eye exam?  No  Who is the provider or what is the name of the office in which the patient attends annual eye exams?  [facility]  If pt is not established with a provider,  would they like to be referred to a provider to establish care? No .   Dental Screening: Recommended annual dental exams for proper oral hygiene  Community Resource Referral / Chronic Care Management: CRR required this visit?  No   CCM required this visit?  No      Plan:     I have personally reviewed and noted the following in the patient's chart:   Medical and social history Use of alcohol, tobacco or illicit drugs  Current medications and supplements including opioid prescriptions. Patient is currently taking opioid prescriptions. Information provided to patient regarding non-opioid alternatives. Patient advised to discuss non-opioid treatment plan with their provider. Functional ability and status Nutritional status Physical activity Advanced directives List of other physicians Hospitalizations, surgeries, and ER visits in previous 12 months Vitals Screenings to include cognitive, depression, and falls Referrals and appointments  In addition, I have reviewed and discussed with patient certain preventive protocols, quality metrics, and best practice recommendations. A written personalized care plan for preventive services as well as general preventive health recommendations were provided to patient.     Judyann Munson, CMA   10/12/2021   Nurse Notes: NONE FACE TO  FACE    Ms. Featherston , Thank you for taking time to come for your Medicare Wellness Visit. I appreciate your ongoing commitment to your health goals. Please review the following plan we discussed and let me know if I can assist you in the future.   These are the goals we discussed:  Goals   None     This is a list of the screening recommended for you and due dates:  Health Maintenance  Topic Date Due   Zoster (Shingles) Vaccine (1 of 2) Never done   Eye exam for diabetics  09/08/2019   Tetanus Vaccine  05/02/2020   COVID-19 Vaccine (4 - Pfizer risk series) 05/23/2020   Mammogram  01/24/2021   Flu Shot  08/28/2021   Pap Smear  11/17/2021   Complete foot exam   11/08/2021   Hemoglobin A1C  03/15/2022   Yearly kidney health urinalysis for diabetes  05/15/2022   Yearly kidney function blood test for diabetes  07/19/2022   Colon Cancer Screening  12/30/2028   Hepatitis C Screening: USPSTF Recommendation to screen - Ages 18-79 yo.  Completed   HIV Screening  Completed   HPV Vaccine  Aged Out

## 2021-08-14 NOTE — Patient Instructions (Signed)

## 2021-08-15 ENCOUNTER — Ambulatory Visit: Payer: Medicare Other | Admitting: Pulmonary Disease

## 2021-08-15 ENCOUNTER — Telehealth: Payer: Medicare Other

## 2021-08-16 ENCOUNTER — Telehealth: Payer: Self-pay

## 2021-08-16 DIAGNOSIS — Z87891 Personal history of nicotine dependence: Secondary | ICD-10-CM | POA: Diagnosis not present

## 2021-08-16 DIAGNOSIS — D631 Anemia in chronic kidney disease: Secondary | ICD-10-CM | POA: Diagnosis not present

## 2021-08-16 DIAGNOSIS — E1140 Type 2 diabetes mellitus with diabetic neuropathy, unspecified: Secondary | ICD-10-CM | POA: Diagnosis not present

## 2021-08-16 DIAGNOSIS — K589 Irritable bowel syndrome without diarrhea: Secondary | ICD-10-CM | POA: Diagnosis not present

## 2021-08-16 DIAGNOSIS — M545 Low back pain, unspecified: Secondary | ICD-10-CM | POA: Diagnosis not present

## 2021-08-16 DIAGNOSIS — G8929 Other chronic pain: Secondary | ICD-10-CM | POA: Diagnosis not present

## 2021-08-16 DIAGNOSIS — I130 Hypertensive heart and chronic kidney disease with heart failure and stage 1 through stage 4 chronic kidney disease, or unspecified chronic kidney disease: Secondary | ICD-10-CM | POA: Diagnosis not present

## 2021-08-16 DIAGNOSIS — E559 Vitamin D deficiency, unspecified: Secondary | ICD-10-CM | POA: Diagnosis not present

## 2021-08-16 DIAGNOSIS — K219 Gastro-esophageal reflux disease without esophagitis: Secondary | ICD-10-CM | POA: Diagnosis not present

## 2021-08-16 DIAGNOSIS — G4733 Obstructive sleep apnea (adult) (pediatric): Secondary | ICD-10-CM | POA: Diagnosis not present

## 2021-08-16 DIAGNOSIS — J9611 Chronic respiratory failure with hypoxia: Secondary | ICD-10-CM | POA: Diagnosis not present

## 2021-08-16 DIAGNOSIS — S83242D Other tear of medial meniscus, current injury, left knee, subsequent encounter: Secondary | ICD-10-CM | POA: Diagnosis not present

## 2021-08-16 DIAGNOSIS — N189 Chronic kidney disease, unspecified: Secondary | ICD-10-CM | POA: Diagnosis not present

## 2021-08-16 DIAGNOSIS — Z8673 Personal history of transient ischemic attack (TIA), and cerebral infarction without residual deficits: Secondary | ICD-10-CM | POA: Diagnosis not present

## 2021-08-16 DIAGNOSIS — E1122 Type 2 diabetes mellitus with diabetic chronic kidney disease: Secondary | ICD-10-CM | POA: Diagnosis not present

## 2021-08-16 DIAGNOSIS — I509 Heart failure, unspecified: Secondary | ICD-10-CM | POA: Diagnosis not present

## 2021-08-16 NOTE — Telephone Encounter (Signed)
Kendra with [facility] requesting VO for PT. Please call back.  

## 2021-08-16 NOTE — Telephone Encounter (Signed)
Agree, thank you

## 2021-08-16 NOTE — Telephone Encounter (Signed)
RTC to [place], PT [facility] would like to extend PT to 1 time weekly every other week x 8 weeks for Pain Management, torn Meniscus and back pain. [name]

## 2021-08-17 NOTE — Telephone Encounter (Signed)
Call to [facility] message left that Dr. Philipp Ovens has agreed to plan for the PT.

## 2021-08-22 ENCOUNTER — Encounter: Payer: Medicare Other | Admitting: Student

## 2021-08-23 ENCOUNTER — Telehealth: Payer: Self-pay

## 2021-08-23 ENCOUNTER — Other Ambulatory Visit: Payer: Self-pay | Admitting: Student

## 2021-08-23 DIAGNOSIS — G8929 Other chronic pain: Secondary | ICD-10-CM

## 2021-08-23 DIAGNOSIS — R110 Nausea: Secondary | ICD-10-CM

## 2021-08-23 MED ORDER — METHOCARBAMOL 750 MG PO TABS: 1500.0000 mg | ORAL_TABLET | Freq: Three times a day (TID) | ORAL | 2 refills | Status: DC | PRN

## 2021-08-23 NOTE — Telephone Encounter (Signed)
methocarbamol (ROBAXIN) 750 MG tablet, REFILL REQUEST @ [id number]- [place], [place] [address]

## 2021-08-23 NOTE — Telephone Encounter (Signed)
#  60 with 1 refill sent 7/10. Patient notified to contact pharmacy for refill.

## 2021-08-23 NOTE — Telephone Encounter (Signed)
Patient called back stating she p/u 10 day supply (60 tabs) on 7/10 and 7/20. She will be out of med by 7/29. She is requesting another refill. She confirms she takes 2 tabs TID.

## 2021-08-23 NOTE — Addendum Note (Signed)
Addended by: Jodean Lima on: 08/23/2021 11:11 AM   Modules accepted: Orders

## 2021-08-23 NOTE — Addendum Note (Signed)
Addended by: Velora Heckler on: 08/23/2021 10:51 AM   Modules accepted: Orders

## 2021-08-24 ENCOUNTER — Ambulatory Visit (INDEPENDENT_AMBULATORY_CARE_PROVIDER_SITE_OTHER): Payer: Medicare Other | Admitting: Licensed Clinical Social Worker

## 2021-08-24 DIAGNOSIS — F410 Panic disorder [episodic paroxysmal anxiety] without agoraphobia: Secondary | ICD-10-CM

## 2021-08-24 DIAGNOSIS — F4312 Post-traumatic stress disorder, chronic: Secondary | ICD-10-CM

## 2021-08-24 DIAGNOSIS — F411 Generalized anxiety disorder: Secondary | ICD-10-CM

## 2021-08-24 DIAGNOSIS — F331 Major depressive disorder, recurrent, moderate: Secondary | ICD-10-CM

## 2021-08-24 NOTE — Progress Notes (Signed)
  Virtual Visit via Video Note  I connected with Lindsay [name] on 08/24/21 at  8:00 AM EDT by a video enabled telemedicine application and verified that I am speaking with the correct person using two identifiers.  Location: Patient: home Provider: home office   I discussed the limitations of evaluation and management by telemedicine and the availability of in person appointments. The patient expressed understanding and agreed to proceed.   I discussed the assessment and treatment plan with the patient. The patient was provided an opportunity to ask questions and all were answered. The patient agreed with the plan and demonstrated an understanding of the instructions.   The patient was advised to call back or seek an in-person evaluation if the symptoms worsen or if the condition fails to improve as anticipated.  I provided 15 minutes of non-face-to-face time during this encounter.  THERAPIST PROGRESS NOTE  Session Time: 8:00 AM to 8:15 AM  Participation Level: Active  Behavioral Response: CasualAlertDysphoric  Type of Therapy: Individual Therapy  Treatment Goals addressed: Patient continues to find therapy helpful for supportive and strength-based interventions as well as continue to focus on seeking help and coping with medical issues, anxiety, triggers, coping, utilize therapy as a way for patient to focus on working through current stressors that also helps to distract from pain  ProgressTowards Goals: Progressing-patient in a lot of pain today but assess helpful for patient to have a support with this therapist as she goes through significant medical issues  Interventions: Solution Focused, Strength-based, Supportive, and Other: Coping  Summary: Lindsay [name] is a 51 y.o. female who presents with hurting really bad lift leg sharp pain. They are not doing anything but has a doctor's appointment that she wants to move up. They are going to change her medicine. Tramadol and Robaxin.   Therapist noted pretty obvious she is in pain even not being a doctor if that the doctors see  what therapist sees an ongoing basis would see she needs some relief.  Therapist provided supportive interventions reaffirmed caring and connection with patient.  Assess helpful for patient to have someone who is there to support her while she is dealing with significant medical issues.  Session Short because patient clearly was in too much pain for longer session.  Suicidal/Homicidal: No  Plan: Return again in 2 weeks.2.  Provide supportive and strength-based interventions as patient dealing with significant stressors  Diagnosis: Major depressive disorder, recurrent, moderate, generalized anxiety disorder, chronic PTSD, panic attacks  Collaboration of Care: Other none needed  Patient/Guardian was advised Release of Information must be obtained prior to any record release in order to collaborate their care with an outside provider. Patient/Guardian was advised if they have not already done so to contact the registration department to sign all necessary forms in order for [name] to release information regarding their care.   Consent: Patient/Guardian gives verbal consent for treatment and assignment of benefits for services provided during this visit. Patient/Guardian expressed understanding and agreed to proceed.   [name], LCSW 08/24/2021

## 2021-08-28 DIAGNOSIS — J9620 Acute and chronic respiratory failure, unspecified whether with hypoxia or hypercapnia: Secondary | ICD-10-CM | POA: Diagnosis not present

## 2021-09-02 ENCOUNTER — Other Ambulatory Visit: Payer: Self-pay | Admitting: Internal Medicine

## 2021-09-02 ENCOUNTER — Other Ambulatory Visit (HOSPITAL_COMMUNITY): Payer: Self-pay | Admitting: Psychiatry

## 2021-09-02 DIAGNOSIS — E559 Vitamin D deficiency, unspecified: Secondary | ICD-10-CM

## 2021-09-02 DIAGNOSIS — F331 Major depressive disorder, recurrent, moderate: Secondary | ICD-10-CM

## 2021-09-03 NOTE — Telephone Encounter (Signed)
Next appt scheduled 8/16 with PCP.

## 2021-09-06 ENCOUNTER — Other Ambulatory Visit: Payer: Self-pay | Admitting: Pulmonary Disease

## 2021-09-06 DIAGNOSIS — M545 Low back pain, unspecified: Secondary | ICD-10-CM | POA: Diagnosis not present

## 2021-09-06 DIAGNOSIS — Z7985 Long-term (current) use of injectable non-insulin antidiabetic drugs: Secondary | ICD-10-CM | POA: Diagnosis not present

## 2021-09-06 DIAGNOSIS — Z8673 Personal history of transient ischemic attack (TIA), and cerebral infarction without residual deficits: Secondary | ICD-10-CM | POA: Diagnosis not present

## 2021-09-06 DIAGNOSIS — K589 Irritable bowel syndrome without diarrhea: Secondary | ICD-10-CM | POA: Diagnosis not present

## 2021-09-06 DIAGNOSIS — E1140 Type 2 diabetes mellitus with diabetic neuropathy, unspecified: Secondary | ICD-10-CM | POA: Diagnosis not present

## 2021-09-06 DIAGNOSIS — D631 Anemia in chronic kidney disease: Secondary | ICD-10-CM | POA: Diagnosis not present

## 2021-09-06 DIAGNOSIS — J9611 Chronic respiratory failure with hypoxia: Secondary | ICD-10-CM | POA: Diagnosis not present

## 2021-09-06 DIAGNOSIS — N189 Chronic kidney disease, unspecified: Secondary | ICD-10-CM | POA: Diagnosis not present

## 2021-09-06 DIAGNOSIS — G4733 Obstructive sleep apnea (adult) (pediatric): Secondary | ICD-10-CM | POA: Diagnosis not present

## 2021-09-06 DIAGNOSIS — Z9181 History of falling: Secondary | ICD-10-CM | POA: Diagnosis not present

## 2021-09-06 DIAGNOSIS — K219 Gastro-esophageal reflux disease without esophagitis: Secondary | ICD-10-CM | POA: Diagnosis not present

## 2021-09-06 DIAGNOSIS — I509 Heart failure, unspecified: Secondary | ICD-10-CM | POA: Diagnosis not present

## 2021-09-06 DIAGNOSIS — I130 Hypertensive heart and chronic kidney disease with heart failure and stage 1 through stage 4 chronic kidney disease, or unspecified chronic kidney disease: Secondary | ICD-10-CM | POA: Diagnosis not present

## 2021-09-06 DIAGNOSIS — G8929 Other chronic pain: Secondary | ICD-10-CM | POA: Diagnosis not present

## 2021-09-06 DIAGNOSIS — R059 Cough, unspecified: Secondary | ICD-10-CM

## 2021-09-06 DIAGNOSIS — Z79891 Long term (current) use of opiate analgesic: Secondary | ICD-10-CM | POA: Diagnosis not present

## 2021-09-06 DIAGNOSIS — E559 Vitamin D deficiency, unspecified: Secondary | ICD-10-CM | POA: Diagnosis not present

## 2021-09-06 DIAGNOSIS — Z87891 Personal history of nicotine dependence: Secondary | ICD-10-CM | POA: Diagnosis not present

## 2021-09-06 DIAGNOSIS — E1122 Type 2 diabetes mellitus with diabetic chronic kidney disease: Secondary | ICD-10-CM | POA: Diagnosis not present

## 2021-09-06 DIAGNOSIS — S83242D Other tear of medial meniscus, current injury, left knee, subsequent encounter: Secondary | ICD-10-CM | POA: Diagnosis not present

## 2021-09-07 ENCOUNTER — Encounter (HOSPITAL_COMMUNITY): Payer: Self-pay | Admitting: Psychiatry

## 2021-09-07 ENCOUNTER — Ambulatory Visit (INDEPENDENT_AMBULATORY_CARE_PROVIDER_SITE_OTHER): Payer: Medicare Other | Admitting: Licensed Clinical Social Worker

## 2021-09-07 ENCOUNTER — Telehealth (HOSPITAL_BASED_OUTPATIENT_CLINIC_OR_DEPARTMENT_OTHER): Payer: Medicare Other | Admitting: Psychiatry

## 2021-09-07 DIAGNOSIS — F331 Major depressive disorder, recurrent, moderate: Secondary | ICD-10-CM | POA: Diagnosis not present

## 2021-09-07 DIAGNOSIS — F410 Panic disorder [episodic paroxysmal anxiety] without agoraphobia: Secondary | ICD-10-CM

## 2021-09-07 DIAGNOSIS — F411 Generalized anxiety disorder: Secondary | ICD-10-CM

## 2021-09-07 DIAGNOSIS — F4312 Post-traumatic stress disorder, chronic: Secondary | ICD-10-CM | POA: Diagnosis not present

## 2021-09-07 MED ORDER — CLORAZEPATE DIPOTASSIUM 3.75 MG PO TABS: 3.7500 mg | ORAL_TABLET | Freq: Every day | ORAL | 2 refills | Status: DC

## 2021-09-07 MED ORDER — LAMOTRIGINE 150 MG PO TABS
150.0000 mg | ORAL_TABLET | Freq: Every day | ORAL | 0 refills | Status: DC
Start: 2021-09-07 — End: 2021-11-07

## 2021-09-07 MED ORDER — MIRTAZAPINE 30 MG PO TABS: 30.0000 mg | ORAL_TABLET | Freq: Every day | ORAL | 0 refills | Status: DC

## 2021-09-07 MED ORDER — REXULTI 3 MG PO TABS
3.0000 mg | ORAL_TABLET | Freq: Every day | ORAL | 0 refills | Status: DC
Start: 2021-09-07 — End: 2021-12-06

## 2021-09-07 NOTE — Progress Notes (Signed)
Virtual Visit via Video Note  I connected with [name] on 09/07/21 at  8:00 AM EDT by a video enabled telemedicine application and verified that I am speaking with the correct person using two identifiers.  Location: Patient: home Provider: home office   I discussed the limitations of evaluation and management by telemedicine and the availability of in person appointments. The patient expressed understanding and agreed to proceed.   I discussed the assessment and treatment plan with the patient. The patient was provided an opportunity to ask questions and all were answered. The patient agreed with the plan and demonstrated an understanding of the instructions.   The patient was advised to call back or seek an in-person evaluation if the symptoms worsen or if the condition fails to improve as anticipated.  I provided 50 minutes of non-face-to-face time during this encounter.  THERAPIST PROGRESS NOTE  Session Time: 8:00 AM to 8:50 AM  Participation Level: Active  Behavioral Response: CasualAlertAnxious  Type of Therapy: Individual Therapy  Treatment Goals addressed: Patient continues to find therapy helpful for supportive and strength-based interventions as well as continue to focus on seeking help and coping with medical issues, anxiety, triggers, coping, utilize therapy as a way for patient to focus on working through current stressors that also helps to distract from pain  ProgressTowards Goals: Progressing-processed patient's stressors in session  and not engaged in problem solving processing feelings to help patient with healthy problem solving  Interventions: Motivational Interviewing, Strength-based, Supportive, and Other: coping  Summary: Lindsay [name] is a 51 y.o. female who presents with in pain. Going to look at webinar for weight loss think going have it losing it but not as fast as she wants. Wants to talk to heart doctor. Concerned about having surgery because of  breathing but also because of heart. She would be doing the procedure with Bariatric [place] surgery. Need heart doctor to talk to these people and add his expertise. Wish could have consortium of different treatment providers. Would like that to happen for everyone be on the same page.  Therapist encouraged patient to talk to doctors make sure that they talk to each other as a matter of providing  needed healthcare for her. Patient says no matter how many doctors have to build faith. Heard about heart doctor since early 51's. Patient says doctor need to pay attention feels life on line.  Therapist asked how she decided to do bariatric surgery and patient says last time went to doctor asked about going to bariatric and she said yes.  She is on Ozempic but has read some articles and worries about stomach paralysis with it.  Doesn't know about taking it. Blood sugar going down. Can tell on it doesn't have the appetite. Therapist encouraged her to talk to doctor has an appointment next week. Owe the money for SSI accept the appeal another date for appeal hearing and patient says that she needs for it to be at home. With mom, took her SSI $199 so getting [phone] because has life insurance state not going to pay premium medicare. As of August 1 not paying for medicare premium $160 leaves her with $531. Patient is not getting enough to pay rent. There is an Insurance underwriter man said he can help get that straightened. Mom not getting Medicaid so much going on falls on patient. Patient going to add her Mom to paperwork for food stamps.  [employer] not going to put her on because not have Medicare plan. One  thing after the other. So much going makes her have panic attacks heart beat fast and kind of heavy. Research scientist (life sciences) knows Medicare/SSI/Medicaid a way to get that back.  At the end of session talk about strategies to help her lose more weight daughter there at the end and says patient need to drink more water and cut down on  sodas. .   Discussed patient patient thinking about bariatric surgery.  Therapist noted losing weight will help her health also agreeing with patient to talk to doctors make sure they are involved so it can be done as safely as possible.  Getting their advice to make sure it is the right choice.  There are benefits from losing weight but definitely make sure it is the right choice through advice with doctors.  Discussed positive already of patient losing weight and continue that process therapist encouraged her and cutting back on bad habits will help the process of losing weight.  Concerned about Ozempic and again will talk to doctor next week about that.  Talked about stressors related to finances engaged in solving to help with stressors therapist sharing her way of organizing things to identify things as urgent need to be done and identify was talking to the insurance man making sure court hearing for daughter is done by video as urgent as on addressing these as priority.  Therapist said when we gain his GYN action and we think we have some control helps decrease anxiety.  Therapist provided space and support for patient to talk about thoughts and feelings in session. Suicidal/Homicidal: No  Plan: Return again in 2 weeks.2.Provide supportive and strength-based interventions as patient dealing with significant stressors   Diagnosis: Major depressive disorder, recurrent, moderate, generalized anxiety disorder, chronic PTSD, panic attacks   Collaboration of Care: Other none needed  Patient/Guardian was advised Release of Information must be obtained prior to any record release in order to collaborate their care with an outside provider. Patient/Guardian was advised if they have not already done so to contact the registration department to sign all necessary forms in order for [name] to release information regarding their care.   Consent: Patient/Guardian gives verbal consent for treatment and assignment  of benefits for services provided during this visit. Patient/Guardian expressed understanding and agreed to proceed.   [name], LCSW 09/07/2021
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
## 2021-09-09 ENCOUNTER — Other Ambulatory Visit: Payer: Self-pay | Admitting: Internal Medicine

## 2021-09-09 DIAGNOSIS — R42 Dizziness and giddiness: Secondary | ICD-10-CM

## 2021-09-12 ENCOUNTER — Encounter: Payer: Self-pay | Admitting: Internal Medicine

## 2021-09-12 ENCOUNTER — Ambulatory Visit (INDEPENDENT_AMBULATORY_CARE_PROVIDER_SITE_OTHER): Payer: Medicare Other | Admitting: Internal Medicine

## 2021-09-12 ENCOUNTER — Other Ambulatory Visit: Payer: Self-pay | Admitting: Internal Medicine

## 2021-09-12 ENCOUNTER — Telehealth: Payer: Self-pay | Admitting: *Deleted

## 2021-09-12 ENCOUNTER — Other Ambulatory Visit: Payer: Self-pay

## 2021-09-12 VITALS — BP 149/93 | HR 87 | Temp 99.5°F | Ht 70.0 in | Wt 363.9 lb

## 2021-09-12 DIAGNOSIS — I10 Essential (primary) hypertension: Secondary | ICD-10-CM

## 2021-09-12 DIAGNOSIS — G894 Chronic pain syndrome: Secondary | ICD-10-CM | POA: Diagnosis not present

## 2021-09-12 DIAGNOSIS — M797 Fibromyalgia: Secondary | ICD-10-CM

## 2021-09-12 DIAGNOSIS — E119 Type 2 diabetes mellitus without complications: Secondary | ICD-10-CM

## 2021-09-12 DIAGNOSIS — E1121 Type 2 diabetes mellitus with diabetic nephropathy: Secondary | ICD-10-CM

## 2021-09-12 DIAGNOSIS — Z87891 Personal history of nicotine dependence: Secondary | ICD-10-CM

## 2021-09-12 DIAGNOSIS — Z7984 Long term (current) use of oral hypoglycemic drugs: Secondary | ICD-10-CM

## 2021-09-12 DIAGNOSIS — J029 Acute pharyngitis, unspecified: Secondary | ICD-10-CM | POA: Diagnosis not present

## 2021-09-12 DIAGNOSIS — G8929 Other chronic pain: Secondary | ICD-10-CM

## 2021-09-12 LAB — POCT GLYCOSYLATED HEMOGLOBIN (HGB A1C): Hemoglobin A1C: 5.5 % (ref 4.0–5.6)

## 2021-09-12 LAB — GLUCOSE, CAPILLARY: Glucose-Capillary: 91 mg/dL (ref 70–99)

## 2021-09-12 MED ORDER — BUPRENORPHINE HCL-NALOXONE HCL 4-1 MG SL FILM: 1.0000 | ORAL_FILM | Freq: Two times a day (BID) | SUBLINGUAL | 0 refills | Status: DC

## 2021-09-12 MED ORDER — METHOCARBAMOL 750 MG PO TABS: 1500.0000 mg | ORAL_TABLET | Freq: Three times a day (TID) | ORAL | 2 refills | Status: DC | PRN

## 2021-09-12 NOTE — Assessment & Plan Note (Signed)
Endorses symptoms of post nasal drip, sore throat, body aches, and low grade fevers. Sent COVID-19 PCR. Advised her to isolate until test results.

## 2021-09-12 NOTE — Progress Notes (Signed)
Subjective:   Patient ID: Lindsay [name] female   DOB: 12/10/1970 51 y.o.   MRN: [id number]  HPI: [name] is a 51 y.o. female with past medical history outlined below here for follow up of chronic pain. For the details of today's visit, please refer to the assessment and plan.   Past Medical History:  Diagnosis Date   Acid reflux    Anemia    Iron Def   Anorexia    CHF (congestive heart failure) (HCC)    Chronic kidney disease    Nephrotic syndrome   Colon polyp 2009   Depression with anxiety    Edema leg    Fibromyalgia    Hemorrhoids    Hidradenitis suppurativa    Hypertension    IBS (irritable bowel syndrome)    Low back pain    Migraines    Morbidly obese (HCC)    Neuromuscular disorder (HCC)    fibromyalgia   Neuropathy    Panic attacks    Polyp of vocal cord or larynx    Tonsil pain    Current Outpatient Medications  Medication Sig Dispense Refill   Buprenorphine HCl-Naloxone HCl (SUBOXONE) 4-1 MG FILM Place 1 Film under the tongue 2 (two) times daily. 28 each 0   ACCU-CHEK GUIDE test strip USE ONE TEST STRIP TO CHECK BLOOD SUGARS ONCE A DAY AS NEEDED 100 strip 1   Accu-Chek Softclix Lancets lancets USE ONE LANCET TO CHECK BLOOD SUGAR ONE A DAY AS NEEDED 100 each 1   ADDYI 100 MG TABS TAKE 1 TABLET BY MOUTH EVERY DAY 30 tablet 5   albuterol (PROVENTIL) (2.5 MG/3ML) 0.083% nebulizer solution TAKE 3 ML (2.5 MG TOTAL) BY NEBULIZATION EVERY 4 HOURS AS NEEDED FOR WHEEZING OR SHORTNESS OF BREATH 150 mL 0   albuterol (VENTOLIN HFA) 108 (90 Base) MCG/ACT inhaler TAKE 2 PUFFS BY MOUTH EVERY 6 HOURS AS NEEDED FOR WHEEZE OR SHORTNESS OF BREATH 8.5 each 0   Ascorbic Acid (VITAMIN C PO) Take 1 tablet by mouth daily.     atorvastatin (LIPITOR) 40 MG tablet Take 1 tablet (40 mg total) by mouth daily. 90 tablet 3   Blood Glucose Monitoring Suppl (ACCU-CHEK GUIDE ME) w/Device KIT Dispense one device 1 kit 0   Brexpiprazole (REXULTI) 3 MG TABS Take 1 tablet (3 mg total) by  mouth daily. 90 tablet 0   Capsaicin 0.025 % PTCH Apply 1 patch topically daily as needed. 10 patch 9   cetirizine (ZYRTEC) 10 MG tablet TAKE 1 TABLET BY MOUTH EVERY DAY 90 tablet 1   clindamycin (CLINDAGEL) 1 % gel APPLY TO AFFECTED AREA TWICE A DAY 30 g 0   clorazepate (TRANXENE) 3.75 MG tablet Take 1 tablet (3.75 mg total) by mouth daily. 30 tablet 2   D3-1000 25 MCG (1000 UT) capsule TAKE 1 CAPSULE BY MOUTH EVERY DAY 90 capsule 1   diclofenac Sodium (VOLTAREN) 1 % GEL APPLY 2 GRAMS TOPICALLY 4 (FOUR) TIMES DAILY AS NEEDED FOR KNEE PAIN 400 g 1   ENTRESTO 49-51 MG Take 1 tablet by mouth 2 (two) times daily.     famotidine (PEPCID) 20 MG tablet TAKE 1 TABLET BY MOUTH EVERY DAY 90 tablet 1   ferrous sulfate 325 (65 FE) MG EC tablet Take 1 tablet (325 mg total) by mouth daily with breakfast. 90 tablet 1   ipratropium (ATROVENT) 0.06 % nasal spray PLEASE SEE ATTACHED FOR DETAILED DIRECTIONS     lamoTRIgine (LAMICTAL) 150 MG tablet Take 1 tablet (  150 mg total) by mouth daily. 90 tablet 0   lidocaine (LIDODERM) 5 % Place 2 patches onto the skin daily. Remove & Discard patch within 12 hours or as directed by MD 60 patch 2   lidocaine (XYLOCAINE) 5 % ointment Apply 1 application topically as needed for mild pain. 50 g 0   meclizine (ANTIVERT) 25 MG tablet TAKE 1 TABLET BY MOUTH THREE TIMES A DAY AS NEEDED FOR DIZZINESS 90 tablet 2   megestrol (MEGACE) 40 MG tablet Take 2 tablets (80 mg total) by mouth daily. 180 tablet 1   metFORMIN (GLUCOPHAGE-XR) 500 MG 24 hr tablet TAKE 2 TABLETS BY MOUTH EVERY DAY WITH BREAKFAST 180 tablet 0   methocarbamol (ROBAXIN) 750 MG tablet Take 2 tablets (1,500 mg total) by mouth every 8 (eight) hours as needed for muscle spasms. 180 tablet 2   metoprolol succinate (TOPROL-XL) 50 MG 24 hr tablet Take 1 tablet (50 mg total) by mouth daily. 90 tablet 3   mirtazapine (REMERON) 30 MG tablet Take 1 tablet (30 mg total) by mouth at bedtime. 90 tablet 0   omeprazole (PRILOSEC) 40  MG capsule TAKE 1 CAPSULE (40 MG TOTAL) BY MOUTH DAILY. 90 capsule 1   ondansetron (ZOFRAN-ODT) 8 MG disintegrating tablet Take 1 tablet (8 mg total) by mouth every 8 (eight) hours as needed. 30 tablet 1   promethazine (PHENERGAN) 12.5 MG tablet TAKE 1 TABLET BY MOUTH EVERY 6 HOURS AS NEEDED. 20 tablet 0   Semaglutide, 2 MG/DOSE, (OZEMPIC, 2 MG/DOSE,) 8 MG/3ML SOPN Inject 2 mg into the skin once a week. 3 mL 3   SYMBICORT 160-4.5 MCG/ACT inhaler INHALE 2 PUFFS INTO THE LUNGS IN THE MORNING AND AT BEDTIME. 10.2 each 0   torsemide (DEMADEX) 20 MG tablet TAKE 2 TABLETS BY MOUTH TWICE A DAY 360 tablet 1   vitamin B-12 (CYANOCOBALAMIN) 1000 MCG tablet Take 1 tablet (1,000 mcg total) by mouth daily. 30 tablet 3   Vitamin E 400 units TABS Take 400 Units by mouth daily.      No current facility-administered medications for this visit.   Family History  Problem Relation Age of Onset   Diabetes Mother    Heart disease Mother        valve leak   Anxiety disorder Mother    Depression Mother    High blood pressure Mother    Kidney failure Mother    Cancer Father        prostate   Heart disease Father    Learning disabilities Sister    Depression Sister    Depression Sister    Anxiety disorder Sister    Colon cancer Neg Hx    Social History   Socioeconomic History   Marital status: Single    Spouse name: Not on file   Number of children: 1   Years of education: 11   Highest education level: Not on file  Occupational History   Occupation: Disabled  Tobacco Use   Smoking status: Former    Packs/day: 0.10    Years: 25.00    Total pack years: 2.50    Types: Cigarettes    Quit date: 05/24/2019    Years since quitting: 2.3   Smokeless tobacco: Never   Tobacco comments:    quit in April.  Vaping Use   Vaping Use: Never used  Substance and Sexual Activity   Alcohol use: Not Currently    Alcohol/week: 0.0 standard drinks of alcohol    Comment: none  sine 05/2018   Drug use: Not  Currently    Frequency: 3.0 times per week    Types: Marijuana   Sexual activity: Yes    Birth control/protection: Implant  Other Topics Concern   Not on file  Social History Narrative   Lives at home w/ her mother and daughter   Right-handed   Caffeine: about 3 Cokes per week   Social Determinants of Health   Financial Resource Strain: High Risk (08/14/2021)   Overall Financial Resource Strain (CARDIA)    Difficulty of Paying Living Expenses: Very hard  Food Insecurity: Food Insecurity Present (08/14/2021)   Hunger Vital Sign    Worried About Running Out of Food in the Last Year: Often true    Ran Out of Food in the Last Year: Often true  Transportation Needs: Unmet Transportation Needs (08/14/2021)   PRAPARE - Transportation    Lack of Transportation (Medical): Yes    Lack of Transportation (Non-Medical): Yes  Physical Activity: Insufficiently Active (08/14/2021)   Exercise Vital Sign    Days of Exercise per Week: 1 day    Minutes of Exercise per Session: 40 min  Stress: No Stress Concern Present (08/14/2021)   [place]    Feeling of Stress : Only a little  Social Connections: Moderately Isolated (08/14/2021)   Social Connection and Isolation Panel [NHANES]    Frequency of Communication with Friends and Family: Never    Frequency of Social Gatherings with Friends and Family: More than three times a week    Attends Religious Services: 1 to 4 times per year    Active Member of [employer] or Organizations: No    Attends Archivist Meetings: Never    Marital Status: Never married    Objective:  Physical Exam:  Vitals:   09/12/21 0935  BP: (!) 149/93  Pulse: 87  Temp: 99.5 F (37.5 C)  TempSrc: Oral  SpO2: 98%  Weight: (!) 363 lb 14.4 oz (165.1 kg)  Height: _0  (1.778 m)    Constitutional: Obese, NAD Cardiovascular: RRR, distant heart sounds but no m/r/g Pulmonary/Chest: Clear bilaterally,  diminished at the bases, normal effort  Extremities: Warm, no edema    Assessment & Plan:   Chronic pain Patient with severe chronic pain syndrome due to difficult to treat fibromyalgia. We have tried and failed multiple first line medications. We decided to trial tramadol at her last visit with me. Unfortunately she did not respond to the 50 or 100 mg dose. We discussed her other comorbidies including morbid obesity, chronic hypoxic respiratory failure, as well as anxiety requiring treatment with benzodiazepines (rx per psychiatry) all making her high risk for respiratory depression with full opioid agonist. She does not have an opioid use disorder, however I think she may benefit from suboxone therapy for her chronic pain. I think this would be a safer option with less risk for overdose and respiratory depression than a full opioid agonists. We discussed risks and benefits of buprenorphine therapy including potential for dependence and addiction. Counseled her on the appropriate way to take buprenorphine and not to mix with other opioid medications. Will start with 4-1 mg dose BID. Follow up telehealth in 2 weeks and in person in 1 month. She does receive some benefit with robaxin, will continue this as well.   Diabetes ([place]) Hgb A1c at goal on ozempic 2 mg weekly. We are seeing weight loss, down another 6lbs since her last visit. Congratulated  patient on this. Encouraged her not to get discouraged as weight loss will take time. She is scheduled to attend a bariatric surgery seminar next month.    Essential hypertension Elevated today but previously at goal on her current regimen. Had labs checked two months ago with normal renal function. Follow up 1 month for repeat BP.   Pharyngitis Endorses symptoms of post nasal drip, sore throat, body aches, and low grade fevers. Sent COVID-19 PCR. Advised her to isolate until test results.

## 2021-09-12 NOTE — Patient Instructions (Signed)
Lindsay [name],  For your chronic pain, I have started you on suboxone. Please take one film twice a day as we discussed and follow up with me in 2 weeks for a telehealth appointment. Also schedule an in person visit for 1 month.   I will call you with the results of your COVID-19 test.  If you have any questions or concerns, call our clinic at [phone] or after hours call [phone] and ask for the internal medicine resident on call.   Thank you,  Dr. Darnell Level

## 2021-09-12 NOTE — Telephone Encounter (Signed)
Pt had called to state [facility] [address] [facility]. [facility] [address]  [facility] [address] - stated [facility]one will arrive tomorrow.  I called pt back to let her know [facility] [address]. Pt stated she will wait until tomorrow.

## 2021-09-12 NOTE — Assessment & Plan Note (Signed)
Patient with severe chronic pain syndrome due to difficult to treat fibromyalgia. We have tried and failed multiple first line medications. We decided to trial tramadol at her last visit with me. Unfortunately she did not respond to the 50 or 100 mg dose. We discussed her other comorbidies including morbid obesity, chronic hypoxic respiratory failure, as well as anxiety requiring treatment with benzodiazepines (rx per psychiatry) all making her high risk for respiratory depression with full opioid agonist. She does not have an opioid use disorder, however I think she may benefit from suboxone therapy for her chronic pain. I think this would be a safer option with less risk for overdose and respiratory depression than a full opioid agonists. We discussed risks and benefits of buprenorphine therapy including potential for dependence and addiction. Counseled her on the appropriate way to take buprenorphine and not to mix with other opioid medications. Will start with 4-1 mg dose BID. Follow up telehealth in 2 weeks and in person in 1 month. She does receive some benefit with robaxin, will continue this as well.

## 2021-09-12 NOTE — Assessment & Plan Note (Signed)
Hgb A1c at goal on ozempic 2 mg weekly. We are seeing weight loss, down another 6lbs since her last visit. Congratulated patient on this. Encouraged her not to get discouraged as weight loss will take time. She is scheduled to attend a bariatric surgery seminar next month.

## 2021-09-12 NOTE — Assessment & Plan Note (Signed)
Elevated today but previously at goal on her current regimen. Had labs checked two months ago with normal renal function. Follow up 1 month for repeat BP.

## 2021-09-12 NOTE — Addendum Note (Signed)
Addended by: Jodean Lima on: 09/12/2021 10:36 AM   Modules accepted: Orders

## 2021-09-13 LAB — NOVEL CORONAVIRUS, NAA: SARS-CoV-2, NAA: NOT DETECTED

## 2021-09-14 ENCOUNTER — Telehealth: Payer: Self-pay | Admitting: *Deleted

## 2021-09-14 ENCOUNTER — Other Ambulatory Visit: Payer: Self-pay | Admitting: Internal Medicine

## 2021-09-14 DIAGNOSIS — A084 Viral intestinal infection, unspecified: Secondary | ICD-10-CM

## 2021-09-14 DIAGNOSIS — I5023 Acute on chronic systolic (congestive) heart failure: Secondary | ICD-10-CM

## 2021-09-14 MED ORDER — ONDANSETRON 8 MG PO TBDP: 8.0000 mg | ORAL_TABLET | Freq: Two times a day (BID) | ORAL | 0 refills | Status: DC | PRN

## 2021-09-14 NOTE — Telephone Encounter (Signed)
Returned call to [name]. Encouraged to eat something 30-60 minutes prior to taking suboxone to see if this helps with nausea. Will also send short course of Zofran prn. Films are dissolving in less than 5 minutes and no residue present. Low concern that this is representative of withdrawal. Encouraged her to call back if these interventions are not helping.

## 2021-09-14 NOTE — Telephone Encounter (Signed)
Call from pt stating she just started taking Suboxone yesterday (new med). States yesterday she had not problems but when she took it today, she became nauseated. She did eat when she took it yesterday. But only had water today. Wants to know is this to be expected? Also requesting a refill,short course,for zofran or promethazine which are on her med list. Thanks

## 2021-09-19 ENCOUNTER — Telehealth: Payer: Self-pay

## 2021-09-19 NOTE — Telephone Encounter (Signed)
Requesting to speak with a nurse about Buprenorphine HCl-Naloxone HCl (SUBOXONE) 4-1 MG FILM, please call pt back.

## 2021-09-19 NOTE — Telephone Encounter (Signed)
Call from pt wanting to know how long does it takes for Suboxone to get into her system. She started taking it on the 17th. Stated she it seems to be helping "slightly". Thanks

## 2021-09-20 DIAGNOSIS — N189 Chronic kidney disease, unspecified: Secondary | ICD-10-CM | POA: Diagnosis not present

## 2021-09-20 DIAGNOSIS — I509 Heart failure, unspecified: Secondary | ICD-10-CM | POA: Diagnosis not present

## 2021-09-20 DIAGNOSIS — Z9181 History of falling: Secondary | ICD-10-CM | POA: Diagnosis not present

## 2021-09-20 DIAGNOSIS — Z87891 Personal history of nicotine dependence: Secondary | ICD-10-CM | POA: Diagnosis not present

## 2021-09-20 DIAGNOSIS — K219 Gastro-esophageal reflux disease without esophagitis: Secondary | ICD-10-CM | POA: Diagnosis not present

## 2021-09-20 DIAGNOSIS — Z7985 Long-term (current) use of injectable non-insulin antidiabetic drugs: Secondary | ICD-10-CM | POA: Diagnosis not present

## 2021-09-20 DIAGNOSIS — J9611 Chronic respiratory failure with hypoxia: Secondary | ICD-10-CM | POA: Diagnosis not present

## 2021-09-20 DIAGNOSIS — D631 Anemia in chronic kidney disease: Secondary | ICD-10-CM | POA: Diagnosis not present

## 2021-09-20 DIAGNOSIS — Z79891 Long term (current) use of opiate analgesic: Secondary | ICD-10-CM | POA: Diagnosis not present

## 2021-09-20 DIAGNOSIS — G4733 Obstructive sleep apnea (adult) (pediatric): Secondary | ICD-10-CM | POA: Diagnosis not present

## 2021-09-20 DIAGNOSIS — E559 Vitamin D deficiency, unspecified: Secondary | ICD-10-CM | POA: Diagnosis not present

## 2021-09-20 DIAGNOSIS — E1122 Type 2 diabetes mellitus with diabetic chronic kidney disease: Secondary | ICD-10-CM | POA: Diagnosis not present

## 2021-09-20 DIAGNOSIS — S83242D Other tear of medial meniscus, current injury, left knee, subsequent encounter: Secondary | ICD-10-CM | POA: Diagnosis not present

## 2021-09-20 DIAGNOSIS — M545 Low back pain, unspecified: Secondary | ICD-10-CM | POA: Diagnosis not present

## 2021-09-20 DIAGNOSIS — G8929 Other chronic pain: Secondary | ICD-10-CM | POA: Diagnosis not present

## 2021-09-20 DIAGNOSIS — I130 Hypertensive heart and chronic kidney disease with heart failure and stage 1 through stage 4 chronic kidney disease, or unspecified chronic kidney disease: Secondary | ICD-10-CM | POA: Diagnosis not present

## 2021-09-20 DIAGNOSIS — Z8673 Personal history of transient ischemic attack (TIA), and cerebral infarction without residual deficits: Secondary | ICD-10-CM | POA: Diagnosis not present

## 2021-09-20 DIAGNOSIS — K589 Irritable bowel syndrome without diarrhea: Secondary | ICD-10-CM | POA: Diagnosis not present

## 2021-09-20 DIAGNOSIS — E1140 Type 2 diabetes mellitus with diabetic neuropathy, unspecified: Secondary | ICD-10-CM | POA: Diagnosis not present

## 2021-09-20 NOTE — Telephone Encounter (Signed)
It should work immediately. Please make sure she is taking is sublingually and not orally.

## 2021-09-20 NOTE — Telephone Encounter (Signed)
Called pt - informed it should work immediately per Dr Philipp Ovens. And stated she's putting it under her tongue and letting it dissolve. Stated she's going to try taking it 1 hour before physical therapy to see if it works better.

## 2021-09-20 NOTE — Telephone Encounter (Signed)
Thank you :)

## 2021-09-21 ENCOUNTER — Ambulatory Visit (INDEPENDENT_AMBULATORY_CARE_PROVIDER_SITE_OTHER): Payer: Medicare Other | Admitting: Licensed Clinical Social Worker

## 2021-09-21 DIAGNOSIS — F4312 Post-traumatic stress disorder, chronic: Secondary | ICD-10-CM | POA: Diagnosis not present

## 2021-09-21 DIAGNOSIS — F331 Major depressive disorder, recurrent, moderate: Secondary | ICD-10-CM

## 2021-09-21 DIAGNOSIS — F411 Generalized anxiety disorder: Secondary | ICD-10-CM

## 2021-09-21 DIAGNOSIS — F410 Panic disorder [episodic paroxysmal anxiety] without agoraphobia: Secondary | ICD-10-CM

## 2021-09-21 NOTE — Progress Notes (Signed)
Virtual Visit via Video Note  I connected with Lindsay [name] on 09/21/21 at  8:00 AM EDT by a video enabled telemedicine application and verified that I am speaking with the correct person using two identifiers.  Location: Patient: home Provider: home office   I discussed the limitations of evaluation and management by telemedicine and the availability of in person appointments. The patient expressed understanding and agreed to proceed.   I discussed the assessment and treatment plan with the patient. The patient was provided an opportunity to ask questions and all were answered. The patient agreed with the plan and demonstrated an understanding of the instructions.   The patient was advised to call back or seek an in-person evaluation if the symptoms worsen or if the condition fails to improve as anticipated.  I provided 45 minutes of non-face-to-face time during this encounter.  THERAPIST PROGRESS NOTE  Session Time: 8:00 AM to 8:45 AM  Participation Level: Active  Behavioral Response: CasualAlertappropriate  Type of Therapy: Individual Therapy  Treatment Goals addressed: Patient continues to find therapy helpful for supportive and strength-based interventions as well as continue to focus on seeking help and coping with medical issues, anxiety, triggers, coping, utilize therapy as a way for patient to focus on working through current stressors that also helps to distract from pain  ProgressTowards Goals: Progressing-patient actively using therapy to help cope with stresses also begin to look at other resources (see below) to learn coping skills  Interventions: CBT, Solution Focused, Strength-based, Supportive, Reframing, and Other: Coping  Summary: Lindsay [name] is a 51 y.o. female who presents with forcing herself to do things around the house has inspection today. Worried about inspection has one both with apartment management and housing authority. Have a lot of stuff in the  house a lot of clutter. Already hurting sharp pain through knee.  Therapist reframe positive patient getting ready to sign maybe able to get around a little more. Had to take two muscle relaxants.  Psychiatrist questions made her think there are reasons to be concerned bariatric surgery he said mental health needs to be stable. Wants her to ask a lot of questions. Difficult decision to make. Went to doctor on 16th putting something under her tongue nalaxone and suboxone. Giving it to her for pain.  Says what she was given as a patch a pain clinic.  Therapist noted now manage pain at primary care if this is better and patient says better at listening not much more prescribed because of breathing issues. Medication under tongue helping a little last night hurting couldn't move legs would scream out in pain this morning in pain not as bad. Patient found a journal for [employer], [employer] or anyone who is [employer]. Lindsay [name]. Go in and answer questions.  We will also look at resources mention by therapist.  (See below)  Therapist very enthusiastic the patient has bought herself a journal therapist noted it is a way for her to work on herself and make progress in therapy it is like doing your own therapy therapist will also look for the journal to follow along with therapist.  Therapist noted positive signs patient moving around a little bit more even though still pain bed and patient agrees talked about bariatric surgery talk to psychiatrist about it and was encouraged to asked lots of questions and to look at webinar before making a decision therapist reinforced these as good advice.  This noted positive patient losing weight that that is going in the  right direction.  Introduced patient to more resources including the book, stop overthinking" as in doctors note he mentions ruminating thoughts and we and therapy work on her anxiety.  Read from the book noted the cause of overthinking is not the problems were  worried about but underlying source of anxiety so that is what we have to tackle.  There is a genetic and environmental component but where we have most control is in our mental models.  Noted therapy in general that is where were doing the work with many different types of symptoms.  Noted book will find strategies for her to get out of these negative thought patterns and will discuss in therapy.  Therapist also introduced depression handout and gave for patient assignment of starting the handout, starting to journal and reading chapter 1 from the book.  Session also included processing patient's feelings related to stressors strategy to help patient with underlying anxiety issues. Suicidal/Homicidal: No  Plan: Return again in 5 weeks.(Therapist on vacation) 2.  Patient begin journal look at other books and start to read, utilize session to process feelings related to stressors  Diagnosis:  Major depressive disorder, recurrent, moderate, generalized anxiety disorder, chronic PTSD, panic attacks  Collaboration of Care: Other review of Dr. Adele Schilder last note  Patient/Guardian was advised Release of Information must be obtained prior to any record release in order to collaborate their care with an outside provider. Patient/Guardian was advised if they have not already done so to contact the registration department to sign all necessary forms in order for [name] to release information regarding their care.   Consent: Patient/Guardian gives verbal consent for treatment and assignment of benefits for services provided during this visit. Patient/Guardian expressed understanding and agreed to proceed.   Lindsay Register, LCSW 09/21/2021

## 2021-09-26 ENCOUNTER — Other Ambulatory Visit: Payer: Self-pay

## 2021-09-26 ENCOUNTER — Encounter: Payer: Self-pay | Admitting: Internal Medicine

## 2021-09-26 ENCOUNTER — Ambulatory Visit (INDEPENDENT_AMBULATORY_CARE_PROVIDER_SITE_OTHER): Payer: Medicare Other | Admitting: Internal Medicine

## 2021-09-26 ENCOUNTER — Other Ambulatory Visit: Payer: Self-pay | Admitting: Internal Medicine

## 2021-09-26 DIAGNOSIS — M79605 Pain in left leg: Secondary | ICD-10-CM

## 2021-09-26 DIAGNOSIS — G8929 Other chronic pain: Secondary | ICD-10-CM

## 2021-09-26 DIAGNOSIS — G894 Chronic pain syndrome: Secondary | ICD-10-CM

## 2021-09-26 DIAGNOSIS — M79604 Pain in right leg: Secondary | ICD-10-CM | POA: Diagnosis not present

## 2021-09-26 MED ORDER — BUPRENORPHINE HCL-NALOXONE HCL 4-1 MG SL FILM: 1.0000 | ORAL_FILM | Freq: Three times a day (TID) | SUBLINGUAL | 0 refills | Status: DC

## 2021-09-26 NOTE — Progress Notes (Signed)
    This is a telephone encounter between Lindsay [name] and Lindsay [name] on 09/26/2021 for follow up of chronic pain after initiation of suboxone therapy. The visit was conducted with the patient located at home and Lindsay [name] at [facility]. The patient's identity was confirmed using their DOB and current address. The patient has consented to being evaluated through a telephone encounter and understands the associated limitations (an examination cannot be done and the patient may need to come in for an appointment). I personally spent 15 minutes on medical discussion.   CC: Chronic pain  HPI:  [name] is a 51 y.o. female with past medical history of chronic pain, difficult to treat fibromyalgia, class 3 obesity, and chronic hypoxic respiratory failure following up on chronic pain after initiation of suboxone. Please see my encounter note from 8/16. She is currently taking subxone films 4-1 mg BID. She takes her first dose around 5 am and her second dose around 5 pm. She does get some relief from her pain and reports it allows her to participate with physical therapy more effectively, however the medication wears off around noon and she is in pain again. Although she is able to work with PT in the mornings, she is still limited in what she can do at home around the house in the afternoon. She takes her last dose in the evening around 5 pm. She denies any side effects; no sedation, constipation, or breathing difficulties. She does endorse some nausea but isn't sure if this is related to the medicine. We discussed possible GI side effects from the naloxone component if swallowed and she has been spitting out the extra residue after sublingual administration as we discussed before. I suggested she try rinsing out her mouth after to see if this helps.    Assessment & Plan:   Chronic pain Improved on low dose suboxone 4-1 mg BID but effects wear off and do not last the entire day. We discussed  options to increase dosing vs. Frequency. Since she is responding to current dose, will increase to TID and follow up again in 1 month. New Rx sent to pharmacy. Plan for controlled substance contract at that time.

## 2021-09-26 NOTE — Assessment & Plan Note (Addendum)
Improved on low dose suboxone 4-1 mg BID but effects wear off and do not last the entire day. We discussed options to increase dosing vs. Frequency. Since she is responding to current dose, will increase to TID and follow up again in 1 month. New Rx sent to pharmacy. Plan for controlled substance contract at that time.

## 2021-09-27 ENCOUNTER — Other Ambulatory Visit: Payer: Self-pay | Admitting: Internal Medicine

## 2021-09-27 DIAGNOSIS — E1121 Type 2 diabetes mellitus with diabetic nephropathy: Secondary | ICD-10-CM

## 2021-09-27 DIAGNOSIS — A084 Viral intestinal infection, unspecified: Secondary | ICD-10-CM

## 2021-09-28 ENCOUNTER — Other Ambulatory Visit: Payer: Self-pay

## 2021-09-28 DIAGNOSIS — M25561 Pain in right knee: Secondary | ICD-10-CM

## 2021-09-28 MED ORDER — DICLOFENAC SODIUM 1 % EX GEL: CUTANEOUS | 1 refills | Status: DC

## 2021-10-03 ENCOUNTER — Other Ambulatory Visit: Payer: Self-pay | Admitting: Family Medicine

## 2021-10-03 DIAGNOSIS — N921 Excessive and frequent menstruation with irregular cycle: Secondary | ICD-10-CM

## 2021-10-04 DIAGNOSIS — N189 Chronic kidney disease, unspecified: Secondary | ICD-10-CM | POA: Diagnosis not present

## 2021-10-04 DIAGNOSIS — D631 Anemia in chronic kidney disease: Secondary | ICD-10-CM | POA: Diagnosis not present

## 2021-10-04 DIAGNOSIS — G8929 Other chronic pain: Secondary | ICD-10-CM | POA: Diagnosis not present

## 2021-10-04 DIAGNOSIS — Z9181 History of falling: Secondary | ICD-10-CM | POA: Diagnosis not present

## 2021-10-04 DIAGNOSIS — K589 Irritable bowel syndrome without diarrhea: Secondary | ICD-10-CM | POA: Diagnosis not present

## 2021-10-04 DIAGNOSIS — Z8673 Personal history of transient ischemic attack (TIA), and cerebral infarction without residual deficits: Secondary | ICD-10-CM | POA: Diagnosis not present

## 2021-10-04 DIAGNOSIS — Z79891 Long term (current) use of opiate analgesic: Secondary | ICD-10-CM | POA: Diagnosis not present

## 2021-10-04 DIAGNOSIS — I130 Hypertensive heart and chronic kidney disease with heart failure and stage 1 through stage 4 chronic kidney disease, or unspecified chronic kidney disease: Secondary | ICD-10-CM | POA: Diagnosis not present

## 2021-10-04 DIAGNOSIS — K219 Gastro-esophageal reflux disease without esophagitis: Secondary | ICD-10-CM | POA: Diagnosis not present

## 2021-10-04 DIAGNOSIS — Z87891 Personal history of nicotine dependence: Secondary | ICD-10-CM | POA: Diagnosis not present

## 2021-10-04 DIAGNOSIS — I509 Heart failure, unspecified: Secondary | ICD-10-CM | POA: Diagnosis not present

## 2021-10-04 DIAGNOSIS — Z7985 Long-term (current) use of injectable non-insulin antidiabetic drugs: Secondary | ICD-10-CM | POA: Diagnosis not present

## 2021-10-04 DIAGNOSIS — J9611 Chronic respiratory failure with hypoxia: Secondary | ICD-10-CM | POA: Diagnosis not present

## 2021-10-04 DIAGNOSIS — G4733 Obstructive sleep apnea (adult) (pediatric): Secondary | ICD-10-CM | POA: Diagnosis not present

## 2021-10-04 DIAGNOSIS — E559 Vitamin D deficiency, unspecified: Secondary | ICD-10-CM | POA: Diagnosis not present

## 2021-10-04 DIAGNOSIS — E1122 Type 2 diabetes mellitus with diabetic chronic kidney disease: Secondary | ICD-10-CM | POA: Diagnosis not present

## 2021-10-04 DIAGNOSIS — S83242D Other tear of medial meniscus, current injury, left knee, subsequent encounter: Secondary | ICD-10-CM | POA: Diagnosis not present

## 2021-10-04 DIAGNOSIS — M545 Low back pain, unspecified: Secondary | ICD-10-CM | POA: Diagnosis not present

## 2021-10-04 DIAGNOSIS — E1140 Type 2 diabetes mellitus with diabetic neuropathy, unspecified: Secondary | ICD-10-CM | POA: Diagnosis not present

## 2021-10-06 DIAGNOSIS — J9620 Acute and chronic respiratory failure, unspecified whether with hypoxia or hypercapnia: Secondary | ICD-10-CM | POA: Diagnosis not present

## 2021-10-08 ENCOUNTER — Other Ambulatory Visit (HOSPITAL_COMMUNITY): Payer: Self-pay | Admitting: Psychiatry

## 2021-10-08 ENCOUNTER — Telehealth: Payer: Self-pay

## 2021-10-08 ENCOUNTER — Other Ambulatory Visit: Payer: Self-pay | Admitting: Internal Medicine

## 2021-10-08 ENCOUNTER — Other Ambulatory Visit: Payer: Self-pay | Admitting: Pulmonary Disease

## 2021-10-08 DIAGNOSIS — I10 Essential (primary) hypertension: Secondary | ICD-10-CM | POA: Diagnosis not present

## 2021-10-08 DIAGNOSIS — F331 Major depressive disorder, recurrent, moderate: Secondary | ICD-10-CM

## 2021-10-08 DIAGNOSIS — E119 Type 2 diabetes mellitus without complications: Secondary | ICD-10-CM | POA: Diagnosis not present

## 2021-10-08 DIAGNOSIS — E785 Hyperlipidemia, unspecified: Secondary | ICD-10-CM | POA: Diagnosis not present

## 2021-10-08 DIAGNOSIS — G8929 Other chronic pain: Secondary | ICD-10-CM

## 2021-10-08 DIAGNOSIS — I5042 Chronic combined systolic (congestive) and diastolic (congestive) heart failure: Secondary | ICD-10-CM | POA: Diagnosis not present

## 2021-10-08 DIAGNOSIS — G629 Polyneuropathy, unspecified: Secondary | ICD-10-CM

## 2021-10-08 DIAGNOSIS — K219 Gastro-esophageal reflux disease without esophagitis: Secondary | ICD-10-CM

## 2021-10-08 MED ORDER — TRAMADOL HCL 50 MG PO TABS: 50.0000 mg | ORAL_TABLET | Freq: Four times a day (QID) | ORAL | 0 refills | Status: DC | PRN

## 2021-10-08 NOTE — Telephone Encounter (Signed)
Discussed with Dr. Philipp Ovens, PCP.  Patient has been on multiple narcotics in the past.  Will restart tramadol.  She will need follow up (telehealth ok) with Dr. Philipp Ovens for further refills.  Gilles Chiquito, MD

## 2021-10-08 NOTE — Telephone Encounter (Signed)
Returned call to patient. States suboxone is not controlling her pain, especially at night. Also, she wasn't able to participate in PT the last 2 times she went 2/2 pain. She is requesting to go back on Tramadol. [facility] [address]

## 2021-10-08 NOTE — Telephone Encounter (Signed)
Left message on patient's VM that [facility] and to please call back to schedule tele appt for refills.

## 2021-10-08 NOTE — Telephone Encounter (Signed)
Requesting to speak with a nurse about Buprenorphine HCl-Naloxone HCl (SUBOXONE) 4-1 MG FILM. Please call pt back.

## 2021-10-09 ENCOUNTER — Other Ambulatory Visit: Payer: Self-pay | Admitting: Pulmonary Disease

## 2021-10-09 ENCOUNTER — Other Ambulatory Visit: Payer: Self-pay | Admitting: Internal Medicine

## 2021-10-09 DIAGNOSIS — A084 Viral intestinal infection, unspecified: Secondary | ICD-10-CM

## 2021-10-09 DIAGNOSIS — R059 Cough, unspecified: Secondary | ICD-10-CM

## 2021-10-15 ENCOUNTER — Encounter: Payer: Self-pay | Admitting: *Deleted

## 2021-10-15 NOTE — Progress Notes (Signed)
[facility] Quality Team Note  Name: Farha Hefty Date of Birth: 04/20/1970 MRN: [id number] Date: 10/15/2021  [facility] Quality Team has reviewed this patient's chart, please see recommendations below:  [facility] Home Kits; [facility] Quality Coordinator mailed patient a KED home kit. [facility] will fax results directly to provider office. 

## 2021-10-17 ENCOUNTER — Encounter: Payer: Self-pay | Admitting: Internal Medicine

## 2021-10-17 ENCOUNTER — Ambulatory Visit (INDEPENDENT_AMBULATORY_CARE_PROVIDER_SITE_OTHER): Payer: Medicare Other | Admitting: Internal Medicine

## 2021-10-17 ENCOUNTER — Telehealth: Payer: Medicare Other | Admitting: Internal Medicine

## 2021-10-17 ENCOUNTER — Other Ambulatory Visit: Payer: Self-pay

## 2021-10-17 DIAGNOSIS — M79605 Pain in left leg: Secondary | ICD-10-CM | POA: Diagnosis not present

## 2021-10-17 DIAGNOSIS — M79604 Pain in right leg: Secondary | ICD-10-CM | POA: Diagnosis not present

## 2021-10-17 DIAGNOSIS — G8929 Other chronic pain: Secondary | ICD-10-CM

## 2021-10-17 DIAGNOSIS — G894 Chronic pain syndrome: Secondary | ICD-10-CM | POA: Diagnosis not present

## 2021-10-17 MED ORDER — TRAMADOL HCL 50 MG PO TABS: 50.0000 mg | ORAL_TABLET | ORAL | 0 refills | Status: DC | PRN

## 2021-10-17 NOTE — Assessment & Plan Note (Signed)
Failed recent trial of buprenorphine. We have switched back to tramadol. I have increased her to 50 mg q4h PRN and sent a month supply. Patient needs in person follow up in 4 weeks for a [place] and controlled substance contract.

## 2021-10-17 NOTE — Assessment & Plan Note (Signed)
Patient requesting ABIs. Her description of pain does not sound consistent with claudication however PAD is an easy thing to rule out. Suspect her pain is related to fibromyalgia which we are managing separately. However could consider rechecking vitamin D level at follow up given her description of deep bone pain.  -- F/u ABIs -- Consider DEXA / vitamin D at follow up

## 2021-10-17 NOTE — Assessment & Plan Note (Signed)
Doing well on ozempic, 2 mg weekly. Reports she has lost 5-6 lbs since her last visit. Follow up in person 1 month.

## 2021-10-17 NOTE — Progress Notes (Signed)
    This is a telephone encounter between Lindsay [name] and Lindsay [name] on 10/17/2021 for follow up of chronic pain. The visit was conducted with the patient located at home and Lindsay [name] at [facility]. The patient's identity was confirmed using their DOB and current address. The patient has consented to being evaluated through a telephone encounter and understands the associated risks (an examination cannot be done and the patient may need to come in for an appointment) / benefits (allows the patient to remain at home, decreasing exposure to coronavirus). I personally spent 21 minutes on medical discussion.   CC: Follow up of chronic pain   HPI:  [name] is a 51 y.o. female with past medical history of chronic pain, difficult to treat fibromyalgia, class 3 obesity, and chronic hypoxic respiratory failure following up on chronic pain. We attempted a trial of suboxone however she reports this did not control her symptoms and called in while I was out on vacation last week requesting to switch back to tramadol. She was given a short term supply of 50 mg q8h PRN. She reports the 50 mg works well to control her pain but does not last the full 8 hours. She was previously on 100 mg q8h but feels more frequent dosing would be more helpful than an increase in dosage. She continues to have severe bilateral pain in her legs that wake her from sleep. She describes the pain in both of her legs, no specific dermatomal involvement. She feels that her legs are different temperatures at times and has a burning pain "down to her bones". She is asking about further work up including ABIs.    Assessment & Plan:   Chronic leg pain Patient requesting ABIs. Her description of pain does not sound consistent with claudication however PAD is an easy thing to rule out. Suspect her pain is related to fibromyalgia which we are managing separately. However could consider rechecking vitamin D level at follow up given  her description of deep bone pain.  -- F/u ABIs -- Consider DEXA / vitamin D at follow up   Chronic pain Failed recent trial of buprenorphine. We have switched back to tramadol. I have increased her to 50 mg q4h PRN and sent a month supply. Patient needs in person follow up in 4 weeks for a [place] and controlled substance contract.   Morbid obesity ([place]) Doing well on ozempic, 2 mg weekly. Reports she has lost 5-6 lbs since her last visit. Follow up in person 1 month.

## 2021-10-18 DIAGNOSIS — Z8673 Personal history of transient ischemic attack (TIA), and cerebral infarction without residual deficits: Secondary | ICD-10-CM | POA: Diagnosis not present

## 2021-10-18 DIAGNOSIS — Z7985 Long-term (current) use of injectable non-insulin antidiabetic drugs: Secondary | ICD-10-CM | POA: Diagnosis not present

## 2021-10-18 DIAGNOSIS — K589 Irritable bowel syndrome without diarrhea: Secondary | ICD-10-CM | POA: Diagnosis not present

## 2021-10-18 DIAGNOSIS — E559 Vitamin D deficiency, unspecified: Secondary | ICD-10-CM | POA: Diagnosis not present

## 2021-10-18 DIAGNOSIS — Z87891 Personal history of nicotine dependence: Secondary | ICD-10-CM | POA: Diagnosis not present

## 2021-10-18 DIAGNOSIS — Z79891 Long term (current) use of opiate analgesic: Secondary | ICD-10-CM | POA: Diagnosis not present

## 2021-10-18 DIAGNOSIS — J9611 Chronic respiratory failure with hypoxia: Secondary | ICD-10-CM | POA: Diagnosis not present

## 2021-10-18 DIAGNOSIS — I130 Hypertensive heart and chronic kidney disease with heart failure and stage 1 through stage 4 chronic kidney disease, or unspecified chronic kidney disease: Secondary | ICD-10-CM | POA: Diagnosis not present

## 2021-10-18 DIAGNOSIS — G8929 Other chronic pain: Secondary | ICD-10-CM | POA: Diagnosis not present

## 2021-10-18 DIAGNOSIS — G4733 Obstructive sleep apnea (adult) (pediatric): Secondary | ICD-10-CM | POA: Diagnosis not present

## 2021-10-18 DIAGNOSIS — E1122 Type 2 diabetes mellitus with diabetic chronic kidney disease: Secondary | ICD-10-CM | POA: Diagnosis not present

## 2021-10-18 DIAGNOSIS — Z9181 History of falling: Secondary | ICD-10-CM | POA: Diagnosis not present

## 2021-10-18 DIAGNOSIS — M545 Low back pain, unspecified: Secondary | ICD-10-CM | POA: Diagnosis not present

## 2021-10-18 DIAGNOSIS — K219 Gastro-esophageal reflux disease without esophagitis: Secondary | ICD-10-CM | POA: Diagnosis not present

## 2021-10-18 DIAGNOSIS — S83242D Other tear of medial meniscus, current injury, left knee, subsequent encounter: Secondary | ICD-10-CM | POA: Diagnosis not present

## 2021-10-18 DIAGNOSIS — N189 Chronic kidney disease, unspecified: Secondary | ICD-10-CM | POA: Diagnosis not present

## 2021-10-18 DIAGNOSIS — D631 Anemia in chronic kidney disease: Secondary | ICD-10-CM | POA: Diagnosis not present

## 2021-10-18 DIAGNOSIS — E1140 Type 2 diabetes mellitus with diabetic neuropathy, unspecified: Secondary | ICD-10-CM | POA: Diagnosis not present

## 2021-10-18 DIAGNOSIS — I509 Heart failure, unspecified: Secondary | ICD-10-CM | POA: Diagnosis not present

## 2021-10-22 DIAGNOSIS — E538 Deficiency of other specified B group vitamins: Secondary | ICD-10-CM | POA: Diagnosis not present

## 2021-10-22 DIAGNOSIS — Z79891 Long term (current) use of opiate analgesic: Secondary | ICD-10-CM | POA: Diagnosis not present

## 2021-10-22 DIAGNOSIS — Z7985 Long-term (current) use of injectable non-insulin antidiabetic drugs: Secondary | ICD-10-CM | POA: Diagnosis not present

## 2021-10-22 DIAGNOSIS — G43909 Migraine, unspecified, not intractable, without status migrainosus: Secondary | ICD-10-CM | POA: Diagnosis not present

## 2021-10-22 DIAGNOSIS — I509 Heart failure, unspecified: Secondary | ICD-10-CM | POA: Diagnosis not present

## 2021-10-22 DIAGNOSIS — I130 Hypertensive heart and chronic kidney disease with heart failure and stage 1 through stage 4 chronic kidney disease, or unspecified chronic kidney disease: Secondary | ICD-10-CM | POA: Diagnosis not present

## 2021-10-22 DIAGNOSIS — M5440 Lumbago with sciatica, unspecified side: Secondary | ICD-10-CM | POA: Diagnosis not present

## 2021-10-22 DIAGNOSIS — N189 Chronic kidney disease, unspecified: Secondary | ICD-10-CM | POA: Diagnosis not present

## 2021-10-22 DIAGNOSIS — Z9181 History of falling: Secondary | ICD-10-CM | POA: Diagnosis not present

## 2021-10-22 DIAGNOSIS — S83242D Other tear of medial meniscus, current injury, left knee, subsequent encounter: Secondary | ICD-10-CM | POA: Diagnosis not present

## 2021-10-22 DIAGNOSIS — Z87891 Personal history of nicotine dependence: Secondary | ICD-10-CM | POA: Diagnosis not present

## 2021-10-22 DIAGNOSIS — M797 Fibromyalgia: Secondary | ICD-10-CM | POA: Diagnosis not present

## 2021-10-22 DIAGNOSIS — K219 Gastro-esophageal reflux disease without esophagitis: Secondary | ICD-10-CM | POA: Diagnosis not present

## 2021-10-22 DIAGNOSIS — Z8673 Personal history of transient ischemic attack (TIA), and cerebral infarction without residual deficits: Secondary | ICD-10-CM | POA: Diagnosis not present

## 2021-10-22 DIAGNOSIS — G8929 Other chronic pain: Secondary | ICD-10-CM | POA: Diagnosis not present

## 2021-10-22 DIAGNOSIS — K589 Irritable bowel syndrome without diarrhea: Secondary | ICD-10-CM | POA: Diagnosis not present

## 2021-10-22 DIAGNOSIS — E559 Vitamin D deficiency, unspecified: Secondary | ICD-10-CM | POA: Diagnosis not present

## 2021-10-22 DIAGNOSIS — J9611 Chronic respiratory failure with hypoxia: Secondary | ICD-10-CM | POA: Diagnosis not present

## 2021-10-22 DIAGNOSIS — D631 Anemia in chronic kidney disease: Secondary | ICD-10-CM | POA: Diagnosis not present

## 2021-10-22 DIAGNOSIS — G4733 Obstructive sleep apnea (adult) (pediatric): Secondary | ICD-10-CM | POA: Diagnosis not present

## 2021-10-22 DIAGNOSIS — E1122 Type 2 diabetes mellitus with diabetic chronic kidney disease: Secondary | ICD-10-CM | POA: Diagnosis not present

## 2021-10-22 DIAGNOSIS — Z86010 Personal history of colonic polyps: Secondary | ICD-10-CM | POA: Diagnosis not present

## 2021-10-22 DIAGNOSIS — E1140 Type 2 diabetes mellitus with diabetic neuropathy, unspecified: Secondary | ICD-10-CM | POA: Diagnosis not present

## 2021-10-25 ENCOUNTER — Encounter: Payer: Self-pay | Admitting: *Deleted

## 2021-10-25 NOTE — Progress Notes (Signed)
[facility] Quality Team Note  Name: Lindsay [name] Date of Birth: 1971-01-04 MRN: [id number] Date: 10/25/2021  [facility] Quality Team has reviewed this patient's chart, please see recommendations below:  AWV;  Mammogram Scheduling;  Diabetic Retinal Eye Exam;  [place]; [place] Coordinator mailed patient a [place] home kit. [facility] will fax results directly to provider office. [facility] Quality Other; (Pt has open gaps for AWV, Mammogram, and diabetic eye exam.  Pt would need appointment and to complete to close gaps.  Sent pt kit for KED measure to help close.  Results will be faxed to your office.)

## 2021-10-26 DIAGNOSIS — Z8673 Personal history of transient ischemic attack (TIA), and cerebral infarction without residual deficits: Secondary | ICD-10-CM | POA: Diagnosis not present

## 2021-10-26 DIAGNOSIS — E1140 Type 2 diabetes mellitus with diabetic neuropathy, unspecified: Secondary | ICD-10-CM | POA: Diagnosis not present

## 2021-10-26 DIAGNOSIS — Z86010 Personal history of colonic polyps: Secondary | ICD-10-CM | POA: Diagnosis not present

## 2021-10-26 DIAGNOSIS — J9611 Chronic respiratory failure with hypoxia: Secondary | ICD-10-CM | POA: Diagnosis not present

## 2021-10-26 DIAGNOSIS — I509 Heart failure, unspecified: Secondary | ICD-10-CM | POA: Diagnosis not present

## 2021-10-26 DIAGNOSIS — M797 Fibromyalgia: Secondary | ICD-10-CM | POA: Diagnosis not present

## 2021-10-26 DIAGNOSIS — E1122 Type 2 diabetes mellitus with diabetic chronic kidney disease: Secondary | ICD-10-CM | POA: Diagnosis not present

## 2021-10-26 DIAGNOSIS — Z9181 History of falling: Secondary | ICD-10-CM | POA: Diagnosis not present

## 2021-10-26 DIAGNOSIS — Z87891 Personal history of nicotine dependence: Secondary | ICD-10-CM | POA: Diagnosis not present

## 2021-10-26 DIAGNOSIS — K589 Irritable bowel syndrome without diarrhea: Secondary | ICD-10-CM | POA: Diagnosis not present

## 2021-10-26 DIAGNOSIS — S83242D Other tear of medial meniscus, current injury, left knee, subsequent encounter: Secondary | ICD-10-CM | POA: Diagnosis not present

## 2021-10-26 DIAGNOSIS — E538 Deficiency of other specified B group vitamins: Secondary | ICD-10-CM | POA: Diagnosis not present

## 2021-10-26 DIAGNOSIS — G43909 Migraine, unspecified, not intractable, without status migrainosus: Secondary | ICD-10-CM | POA: Diagnosis not present

## 2021-10-26 DIAGNOSIS — N189 Chronic kidney disease, unspecified: Secondary | ICD-10-CM | POA: Diagnosis not present

## 2021-10-26 DIAGNOSIS — E559 Vitamin D deficiency, unspecified: Secondary | ICD-10-CM | POA: Diagnosis not present

## 2021-10-26 DIAGNOSIS — Z79891 Long term (current) use of opiate analgesic: Secondary | ICD-10-CM | POA: Diagnosis not present

## 2021-10-26 DIAGNOSIS — I130 Hypertensive heart and chronic kidney disease with heart failure and stage 1 through stage 4 chronic kidney disease, or unspecified chronic kidney disease: Secondary | ICD-10-CM | POA: Diagnosis not present

## 2021-10-26 DIAGNOSIS — M5440 Lumbago with sciatica, unspecified side: Secondary | ICD-10-CM | POA: Diagnosis not present

## 2021-10-26 DIAGNOSIS — G4733 Obstructive sleep apnea (adult) (pediatric): Secondary | ICD-10-CM | POA: Diagnosis not present

## 2021-10-26 DIAGNOSIS — K219 Gastro-esophageal reflux disease without esophagitis: Secondary | ICD-10-CM | POA: Diagnosis not present

## 2021-10-26 DIAGNOSIS — Z7985 Long-term (current) use of injectable non-insulin antidiabetic drugs: Secondary | ICD-10-CM | POA: Diagnosis not present

## 2021-10-26 DIAGNOSIS — D631 Anemia in chronic kidney disease: Secondary | ICD-10-CM | POA: Diagnosis not present

## 2021-10-26 DIAGNOSIS — G8929 Other chronic pain: Secondary | ICD-10-CM | POA: Diagnosis not present

## 2021-10-31 ENCOUNTER — Ambulatory Visit (INDEPENDENT_AMBULATORY_CARE_PROVIDER_SITE_OTHER): Payer: Medicare Other | Admitting: Licensed Clinical Social Worker

## 2021-10-31 DIAGNOSIS — J9611 Chronic respiratory failure with hypoxia: Secondary | ICD-10-CM | POA: Diagnosis not present

## 2021-10-31 DIAGNOSIS — F411 Generalized anxiety disorder: Secondary | ICD-10-CM | POA: Diagnosis not present

## 2021-10-31 DIAGNOSIS — Z86010 Personal history of colonic polyps: Secondary | ICD-10-CM | POA: Diagnosis not present

## 2021-10-31 DIAGNOSIS — D631 Anemia in chronic kidney disease: Secondary | ICD-10-CM | POA: Diagnosis not present

## 2021-10-31 DIAGNOSIS — K589 Irritable bowel syndrome without diarrhea: Secondary | ICD-10-CM | POA: Diagnosis not present

## 2021-10-31 DIAGNOSIS — K219 Gastro-esophageal reflux disease without esophagitis: Secondary | ICD-10-CM | POA: Diagnosis not present

## 2021-10-31 DIAGNOSIS — F4312 Post-traumatic stress disorder, chronic: Secondary | ICD-10-CM | POA: Diagnosis not present

## 2021-10-31 DIAGNOSIS — G43909 Migraine, unspecified, not intractable, without status migrainosus: Secondary | ICD-10-CM | POA: Diagnosis not present

## 2021-10-31 DIAGNOSIS — Z7985 Long-term (current) use of injectable non-insulin antidiabetic drugs: Secondary | ICD-10-CM | POA: Diagnosis not present

## 2021-10-31 DIAGNOSIS — Z87891 Personal history of nicotine dependence: Secondary | ICD-10-CM | POA: Diagnosis not present

## 2021-10-31 DIAGNOSIS — G4733 Obstructive sleep apnea (adult) (pediatric): Secondary | ICD-10-CM | POA: Diagnosis not present

## 2021-10-31 DIAGNOSIS — Z79891 Long term (current) use of opiate analgesic: Secondary | ICD-10-CM | POA: Diagnosis not present

## 2021-10-31 DIAGNOSIS — E1122 Type 2 diabetes mellitus with diabetic chronic kidney disease: Secondary | ICD-10-CM | POA: Diagnosis not present

## 2021-10-31 DIAGNOSIS — E559 Vitamin D deficiency, unspecified: Secondary | ICD-10-CM | POA: Diagnosis not present

## 2021-10-31 DIAGNOSIS — G8929 Other chronic pain: Secondary | ICD-10-CM | POA: Diagnosis not present

## 2021-10-31 DIAGNOSIS — F410 Panic disorder [episodic paroxysmal anxiety] without agoraphobia: Secondary | ICD-10-CM

## 2021-10-31 DIAGNOSIS — I130 Hypertensive heart and chronic kidney disease with heart failure and stage 1 through stage 4 chronic kidney disease, or unspecified chronic kidney disease: Secondary | ICD-10-CM | POA: Diagnosis not present

## 2021-10-31 DIAGNOSIS — N189 Chronic kidney disease, unspecified: Secondary | ICD-10-CM | POA: Diagnosis not present

## 2021-10-31 DIAGNOSIS — I509 Heart failure, unspecified: Secondary | ICD-10-CM | POA: Diagnosis not present

## 2021-10-31 DIAGNOSIS — E538 Deficiency of other specified B group vitamins: Secondary | ICD-10-CM | POA: Diagnosis not present

## 2021-10-31 DIAGNOSIS — Z8673 Personal history of transient ischemic attack (TIA), and cerebral infarction without residual deficits: Secondary | ICD-10-CM | POA: Diagnosis not present

## 2021-10-31 DIAGNOSIS — Z9181 History of falling: Secondary | ICD-10-CM | POA: Diagnosis not present

## 2021-10-31 DIAGNOSIS — M5440 Lumbago with sciatica, unspecified side: Secondary | ICD-10-CM | POA: Diagnosis not present

## 2021-10-31 DIAGNOSIS — M797 Fibromyalgia: Secondary | ICD-10-CM | POA: Diagnosis not present

## 2021-10-31 DIAGNOSIS — E1140 Type 2 diabetes mellitus with diabetic neuropathy, unspecified: Secondary | ICD-10-CM | POA: Diagnosis not present

## 2021-10-31 DIAGNOSIS — S83242D Other tear of medial meniscus, current injury, left knee, subsequent encounter: Secondary | ICD-10-CM | POA: Diagnosis not present

## 2021-10-31 DIAGNOSIS — F331 Major depressive disorder, recurrent, moderate: Secondary | ICD-10-CM

## 2021-10-31 NOTE — Progress Notes (Signed)
Virtual Visit via Video Note  I connected with Lindsay [name] on 10/31/21 at  8:00 AM EDT by a video enabled telemedicine application and verified that I am speaking with the correct person using two identifiers.  Location: Patient: home Provider: home office   I discussed the limitations of evaluation and management by telemedicine and the availability of in person appointments. The patient expressed understanding and agreed to proceed.  I discussed the assessment and treatment plan with the patient. The patient was provided an opportunity to ask questions and all were answered. The patient agreed with the plan and demonstrated an understanding of the instructions.   The patient was advised to call back or seek an in-person evaluation if the symptoms worsen or if the condition fails to improve as anticipated.  I provided 45 minutes of non-face-to-face time during this encounter.  THERAPIST PROGRESS NOTE  Session Time: 8:00 AM to 8:45 AM  Participation Level: Active  Behavioral Response: CasualAlertappropriate but continues to report stressors related to medical issues  Type of Therapy: Individual Therapy  Treatment Goals addressed: Patient continue to utilize therapy for  supportive and strength-based interventions, provide treatment interventions in the context of patient continuing to seek help and cope with medical issues, anxiety, triggers, coping with mental health symptoms, utilize therapy as a way for patient to focus on working through current stressors that also helps to distract from pain  ProgressTowards Goals: Progressing-reviewed treatment goals today confirmed helpfulness of therapy and working on issues and in coping  Interventions: Solution Focused, Strength-based, Supportive, and Other: Being  Summary: Lindsay [name] is a 51 y.o. female who presents with physical therapy stopped but doctor wants her to continue. Had to do a reassessment. Has an aid coming at 35 and  physical therapist at 10 and tons of paper work.  Therapist noted positive people coming to her house.  Patient talked more about treatments they want her have ice pack and heat pack nuisance and difficult to do. 4-5 days haven't been eating. Stomach hurting badly think had stomach flu remind her of salmonella poison in May. Spell before that not eating.  They want her to get a new bed so doesn't sleep so high.  Patient pointed out cannot afford that or would do it. Still feel stomach flu in pit of the stomach. Lost weight over 50 pounds. Scared to see what it is plans to go to doctor ask her to wean her off doesn't want stomach paralysis and explained what that is to therapist. You get sick in stomach, not eating, have to have surgery stomach muscles not operating properly. Problem with stools thought that related to laxatives took but not the case still have the problem. Taking Tramadol 50 mg every 4 hours needs a refill, Robaxin doesn't need a refill. Asked doctor and wants to do test due issue with pressure in her legs,wants to know if blood vessels pumping blood right, legs feels numb, wants to know if tearing near bone, do have neuropathy but concerned and doctor willing for her to do the test with the referral. Says tramadol helps her do physical therapy better.  As far as people come to the house patient says she feels its good good for her, getting out is a struggle, getting up, getting out of house, wait for transportation, physical therapy just leaving the house. Trying to maintain. Not doing anything fun.  Shared just took a Zofran feeling nauseated and feeling could throw up.  Ended session after patient caught  up with therapist  Therapist reviewed symptoms noted significant stomach issues to the point patient cannot talk to her doctor about it may be related to medication.  Therapist noted positive losing little weight but the same time ultimately health issues are what matters so patient addressing  her concerns is what she needs to do to relieve her fears.  Therapist shared her impression patient looking better patient though says nothing is changed still putting a note as a therapeutic impression.  In session reviewed treatment goals reinforced the strength of therapeutic relationship that is validating for both therapist and patient as well as significant and helping patient and provide motivation for her to want to continue.  Reviewed medical issues patient concerns about and provided positive feedback going to talk to doctors and they are assisting her with test that she would like to have.  Therapist noted also positive people coming to her house less isolative.  This provided active listening open questions supportive interventions.  Suicidal/Homicidal: No  Plan: Return again in 2 weeks.2.  Utilize session to process feelings work on good healthy coping strategies  Diagnosis: Major depressive disorder, recurrent, moderate, generalized anxiety disorder, chronic PTSD, panic attacks   Collaboration of Care: Other none needed  Patient/Guardian was advised Release of Information must be obtained prior to any record release in order to collaborate their care with an outside provider. Patient/Guardian was advised if they have not already done so to contact the registration department to sign all necessary forms in order for [name] to release information regarding their care.   Consent: Patient/Guardian gives verbal consent for treatment and assignment of benefits for services provided during this visit. Patient/Guardian expressed understanding and agreed to proceed.   [name], LCSW 10/31/2021

## 2021-11-05 DIAGNOSIS — J9620 Acute and chronic respiratory failure, unspecified whether with hypoxia or hypercapnia: Secondary | ICD-10-CM | POA: Diagnosis not present

## 2021-11-06 ENCOUNTER — Other Ambulatory Visit: Payer: Self-pay | Admitting: Pulmonary Disease

## 2021-11-06 ENCOUNTER — Other Ambulatory Visit (HOSPITAL_COMMUNITY): Payer: Self-pay | Admitting: Psychiatry

## 2021-11-06 ENCOUNTER — Other Ambulatory Visit: Payer: Self-pay | Admitting: Internal Medicine

## 2021-11-06 DIAGNOSIS — S83242D Other tear of medial meniscus, current injury, left knee, subsequent encounter: Secondary | ICD-10-CM | POA: Diagnosis not present

## 2021-11-06 DIAGNOSIS — R059 Cough, unspecified: Secondary | ICD-10-CM

## 2021-11-06 DIAGNOSIS — Z9181 History of falling: Secondary | ICD-10-CM | POA: Diagnosis not present

## 2021-11-06 DIAGNOSIS — E559 Vitamin D deficiency, unspecified: Secondary | ICD-10-CM | POA: Diagnosis not present

## 2021-11-06 DIAGNOSIS — K219 Gastro-esophageal reflux disease without esophagitis: Secondary | ICD-10-CM | POA: Diagnosis not present

## 2021-11-06 DIAGNOSIS — M5440 Lumbago with sciatica, unspecified side: Secondary | ICD-10-CM | POA: Diagnosis not present

## 2021-11-06 DIAGNOSIS — G43909 Migraine, unspecified, not intractable, without status migrainosus: Secondary | ICD-10-CM | POA: Diagnosis not present

## 2021-11-06 DIAGNOSIS — E119 Type 2 diabetes mellitus without complications: Secondary | ICD-10-CM

## 2021-11-06 DIAGNOSIS — Z87891 Personal history of nicotine dependence: Secondary | ICD-10-CM | POA: Diagnosis not present

## 2021-11-06 DIAGNOSIS — Z86010 Personal history of colonic polyps: Secondary | ICD-10-CM | POA: Diagnosis not present

## 2021-11-06 DIAGNOSIS — E1140 Type 2 diabetes mellitus with diabetic neuropathy, unspecified: Secondary | ICD-10-CM | POA: Diagnosis not present

## 2021-11-06 DIAGNOSIS — M797 Fibromyalgia: Secondary | ICD-10-CM | POA: Diagnosis not present

## 2021-11-06 DIAGNOSIS — N189 Chronic kidney disease, unspecified: Secondary | ICD-10-CM | POA: Diagnosis not present

## 2021-11-06 DIAGNOSIS — J9611 Chronic respiratory failure with hypoxia: Secondary | ICD-10-CM | POA: Diagnosis not present

## 2021-11-06 DIAGNOSIS — Z79891 Long term (current) use of opiate analgesic: Secondary | ICD-10-CM | POA: Diagnosis not present

## 2021-11-06 DIAGNOSIS — M25561 Pain in right knee: Secondary | ICD-10-CM

## 2021-11-06 DIAGNOSIS — E538 Deficiency of other specified B group vitamins: Secondary | ICD-10-CM | POA: Diagnosis not present

## 2021-11-06 DIAGNOSIS — D631 Anemia in chronic kidney disease: Secondary | ICD-10-CM | POA: Diagnosis not present

## 2021-11-06 DIAGNOSIS — I509 Heart failure, unspecified: Secondary | ICD-10-CM | POA: Diagnosis not present

## 2021-11-06 DIAGNOSIS — E1122 Type 2 diabetes mellitus with diabetic chronic kidney disease: Secondary | ICD-10-CM | POA: Diagnosis not present

## 2021-11-06 DIAGNOSIS — I130 Hypertensive heart and chronic kidney disease with heart failure and stage 1 through stage 4 chronic kidney disease, or unspecified chronic kidney disease: Secondary | ICD-10-CM | POA: Diagnosis not present

## 2021-11-06 DIAGNOSIS — G8929 Other chronic pain: Secondary | ICD-10-CM | POA: Diagnosis not present

## 2021-11-06 DIAGNOSIS — F331 Major depressive disorder, recurrent, moderate: Secondary | ICD-10-CM

## 2021-11-06 DIAGNOSIS — G4733 Obstructive sleep apnea (adult) (pediatric): Secondary | ICD-10-CM | POA: Diagnosis not present

## 2021-11-06 DIAGNOSIS — Z8673 Personal history of transient ischemic attack (TIA), and cerebral infarction without residual deficits: Secondary | ICD-10-CM | POA: Diagnosis not present

## 2021-11-06 DIAGNOSIS — Z7985 Long-term (current) use of injectable non-insulin antidiabetic drugs: Secondary | ICD-10-CM | POA: Diagnosis not present

## 2021-11-06 DIAGNOSIS — K589 Irritable bowel syndrome without diarrhea: Secondary | ICD-10-CM | POA: Diagnosis not present

## 2021-11-06 NOTE — Telephone Encounter (Signed)
Next appt scheduled 11/28/21 with PCP. 

## 2021-11-07 ENCOUNTER — Other Ambulatory Visit: Payer: Self-pay | Admitting: Internal Medicine

## 2021-11-07 ENCOUNTER — Other Ambulatory Visit (HOSPITAL_COMMUNITY): Payer: Self-pay | Admitting: *Deleted

## 2021-11-07 DIAGNOSIS — A084 Viral intestinal infection, unspecified: Secondary | ICD-10-CM

## 2021-11-07 DIAGNOSIS — L732 Hidradenitis suppurativa: Secondary | ICD-10-CM

## 2021-11-07 DIAGNOSIS — F331 Major depressive disorder, recurrent, moderate: Secondary | ICD-10-CM

## 2021-11-07 MED ORDER — LAMOTRIGINE 150 MG PO TABS: 150.0000 mg | ORAL_TABLET | Freq: Every day | ORAL | 0 refills | Status: DC

## 2021-11-08 ENCOUNTER — Other Ambulatory Visit: Payer: Self-pay | Admitting: Internal Medicine

## 2021-11-08 DIAGNOSIS — G894 Chronic pain syndrome: Secondary | ICD-10-CM

## 2021-11-08 DIAGNOSIS — R42 Dizziness and giddiness: Secondary | ICD-10-CM

## 2021-11-08 MED ORDER — CLINDAMYCIN PHOSPHATE 1 % EX GEL: CUTANEOUS | 0 refills | Status: DC

## 2021-11-10 ENCOUNTER — Other Ambulatory Visit (HOSPITAL_COMMUNITY): Payer: Self-pay | Admitting: Psychiatry

## 2021-11-10 DIAGNOSIS — F331 Major depressive disorder, recurrent, moderate: Secondary | ICD-10-CM

## 2021-11-12 ENCOUNTER — Ambulatory Visit (INDEPENDENT_AMBULATORY_CARE_PROVIDER_SITE_OTHER): Payer: Medicare Other | Admitting: Licensed Clinical Social Worker

## 2021-11-12 ENCOUNTER — Telehealth: Payer: Self-pay | Admitting: *Deleted

## 2021-11-12 DIAGNOSIS — F331 Major depressive disorder, recurrent, moderate: Secondary | ICD-10-CM | POA: Diagnosis not present

## 2021-11-12 DIAGNOSIS — F4312 Post-traumatic stress disorder, chronic: Secondary | ICD-10-CM | POA: Diagnosis not present

## 2021-11-12 DIAGNOSIS — F410 Panic disorder [episodic paroxysmal anxiety] without agoraphobia: Secondary | ICD-10-CM | POA: Diagnosis not present

## 2021-11-12 DIAGNOSIS — E1121 Type 2 diabetes mellitus with diabetic nephropathy: Secondary | ICD-10-CM

## 2021-11-12 DIAGNOSIS — F411 Generalized anxiety disorder: Secondary | ICD-10-CM | POA: Diagnosis not present

## 2021-11-12 MED ORDER — OZEMPIC (2 MG/DOSE) 8 MG/3ML ~~LOC~~ SOPN: 2.0000 mg | PEN_INJECTOR | SUBCUTANEOUS | 3 refills | Status: DC

## 2021-11-12 NOTE — Telephone Encounter (Signed)
Patient called in stating [facility] She has missed 3 weekly dose. She is advised to call other pharmacies to see who may have it in stock. She will call back if she finds it so Rx can be resent.

## 2021-11-12 NOTE — Telephone Encounter (Signed)
Done. She may need to decrease her dose back to 0.5 mg or 1 mg since she has missed a few weeks. If no side effects, can increase back to the 2 mg dose in the next couple of weeks. Thanks.

## 2021-11-12 NOTE — Telephone Encounter (Addendum)
Please send rx to pharmacy attached to note. [address], [place], [place] [address]  [phone]

## 2021-11-12 NOTE — Progress Notes (Signed)
Virtual Visit via Video Note  I connected with [name] on 11/12/21 at  8:00 AM EDT by a video enabled telemedicine application and verified that I am speaking with the correct person using two identifiers.  Location: Patient: home Provider: office   I discussed the limitations of evaluation and management by telemedicine and the availability of in person appointments. The patient expressed understanding and agreed to proceed.    I discussed the assessment and treatment plan with the patient. The patient was provided an opportunity to ask questions and all were answered. The patient agreed with the plan and demonstrated an understanding of the instructions.   The patient was advised to call back or seek an in-person evaluation if the symptoms worsen or if the condition fails to improve as anticipated.  I provided 52 minutes of non-face-to-face time during this encounter.   THERAPIST PROGRESS NOTE  Session Time: 8:00 AM to 8:52 AM  Participation Level: Active  Behavioral Response: CasualAlertAnxious and Dysphoric  Type of Therapy: Individual Therapy  Treatment Goals addressed: Patient continue to utilize therapy for  supportive and strength-based interventions, provide treatment interventions in the context of patient continuing to seek help and cope with medical issues, anxiety, triggers, coping with mental health symptoms, utilize therapy as a way for patient to focus on working through current stressors that also helps to distract from pain  ProgressTowards Goals: Progressing-patient having significant symptoms but is says helpful for her to process feelings and thoughts in session to help with coping  Interventions: Solution Focused, Strength-based, Supportive, and Other: coping  Summary: Marielle Mantione is a 51 y.o. female who presents with shares daughter said to her always been a bad parent and never treated would treat her kids the way patient treated her. Patient says has  had to fight for her and fight her with bipolar and ODD. She needs an another assessment. Shared history of managing daughter and how challenging she was. Hitting therapist if she didn't like direction would be aggressive. Always calling patient about issues from school. Had to fight for the IEP she had. Patient said had to do it learn along the way. Called at one time Theatre manager from [employer]. A Parent advocate got the help she needed. When asked after the school they were try to do it because they didn't want to deal with parent advocate. Patient cried when daughter said that to her. Her daughter told patient that she never listens. Patient describes their interaction constantly talking patient said shouldn't be saying those things. Cussing patient disrespectful and other teens telling her never talk to Mom like that. She is doing stuff that 16/17 do. She has always be a late 30. Explored if daughter is better than before. Patient said no. Has threatened to harm herself because people treat her like shit. Patient said haven't done that and she picks up what other people are doing.  Doesn't know where she gets there. She tells her therapist this. New boyfriend may have ADHD. Whoever she hangs around starts to act like them. It is exhausting. Shared another stressor her sister right side of brain right low grade cancer could have a brain aneurysm on right side of brain. Patient tried to calm her and said perhaps only a minor procedure and monitor it with low grade cancer. Tell her to come home. Doesn't think have good mental health with things dealing with daughter. Not healthy at all. Mental health is a serious issue. Explored source fiances of home and  worry that utility be shut off. She wasn't getting on her nerves for a long time now overwhelming. Patient is POA financially and health wise. Contact attorney about POA so can contact therapist have to step in again. Feel doesn't have mental capacity  and can't cope because of health issues.  Reviewed session and patient said at least get it out and more relaxed and won't panic. Can be stronger when she does these things. Harsher when she is older. 2-3 abortions to take care of daughter-her life and kids life. To take care of her daughter. Toil with spiritual with what she has done to daughter. Therapist pointed out from her perspective patient has done the opposite and has been an advocate for her daughter been there for her to fight battles to get what she needs.  Therapist encouraged patient to set boundaries tell daughter she is not going to listen to that if she gets negative.      Therapist work with patient working on perspective with stress of her daughter that she is acting in ways she has acted in the past being challenging and difficult with diagnosis of bipolar and ODD.  Therapist working on patient gaining perspective to note this is how she can be in this perspective and insight help with coping.  Utilize patient's own insight that she picks up things from other people and mimics them so probably a phase, also related to her not maturing so continues to be challenging.  Therapist directed patient positive that daughter is talking to therapist also patient herself can talk to therapist about concerns.  Noted ways she talk to her sister is helpful helping her sister challenge being overwhelmed and catastrophizing for more reasonable and helpful ways to cope.  Therapist provided her perspective from time she spent with patient that she has been actually an advocate for her daughter gone above and beyond the most parents because of her daughter's needs.  Therapist provided space and support for patient to talk about thoughts and feelings in session. Suicidal/Homicidal: No  Plan: Return again in 3 days(therapist had an opening and patient felt needed another session due to stressors) 2.  Therapist processed patient's feelings in session to help with  insight and coping  Diagnosis: Major depressive disorder, recurrent, moderate, generalized anxiety disorder, chronic PTSD, panic attacks  Collaboration of Care: Other none needed  Patient/Guardian was advised Release of Information must be obtained prior to any record release in order to collaborate their care with an outside provider. Patient/Guardian was advised if they have not already done so to contact the registration department to sign all necessary forms in order for [name] to release information regarding their care.   Consent: Patient/Guardian gives verbal consent for treatment and assignment of benefits for services provided during this visit. Patient/Guardian expressed understanding and agreed to proceed.   [name], LCSW 11/12/2021

## 2021-11-12 NOTE — Telephone Encounter (Signed)
Pt called and informed Ozempic rx has been sent to [facility] and to let the office know if she develop any side effects since she had been off med x 3 weekly doses;stated she will.

## 2021-11-14 ENCOUNTER — Other Ambulatory Visit: Payer: Self-pay

## 2021-11-14 DIAGNOSIS — Z79891 Long term (current) use of opiate analgesic: Secondary | ICD-10-CM | POA: Diagnosis not present

## 2021-11-14 DIAGNOSIS — Z86010 Personal history of colonic polyps: Secondary | ICD-10-CM | POA: Diagnosis not present

## 2021-11-14 DIAGNOSIS — Z8673 Personal history of transient ischemic attack (TIA), and cerebral infarction without residual deficits: Secondary | ICD-10-CM | POA: Diagnosis not present

## 2021-11-14 DIAGNOSIS — E538 Deficiency of other specified B group vitamins: Secondary | ICD-10-CM | POA: Diagnosis not present

## 2021-11-14 DIAGNOSIS — N189 Chronic kidney disease, unspecified: Secondary | ICD-10-CM | POA: Diagnosis not present

## 2021-11-14 DIAGNOSIS — M5440 Lumbago with sciatica, unspecified side: Secondary | ICD-10-CM | POA: Diagnosis not present

## 2021-11-14 DIAGNOSIS — Z87891 Personal history of nicotine dependence: Secondary | ICD-10-CM | POA: Diagnosis not present

## 2021-11-14 DIAGNOSIS — Z7985 Long-term (current) use of injectable non-insulin antidiabetic drugs: Secondary | ICD-10-CM | POA: Diagnosis not present

## 2021-11-14 DIAGNOSIS — G8929 Other chronic pain: Secondary | ICD-10-CM

## 2021-11-14 DIAGNOSIS — K219 Gastro-esophageal reflux disease without esophagitis: Secondary | ICD-10-CM | POA: Diagnosis not present

## 2021-11-14 DIAGNOSIS — E1122 Type 2 diabetes mellitus with diabetic chronic kidney disease: Secondary | ICD-10-CM | POA: Diagnosis not present

## 2021-11-14 DIAGNOSIS — G4733 Obstructive sleep apnea (adult) (pediatric): Secondary | ICD-10-CM | POA: Diagnosis not present

## 2021-11-14 DIAGNOSIS — I130 Hypertensive heart and chronic kidney disease with heart failure and stage 1 through stage 4 chronic kidney disease, or unspecified chronic kidney disease: Secondary | ICD-10-CM | POA: Diagnosis not present

## 2021-11-14 DIAGNOSIS — E559 Vitamin D deficiency, unspecified: Secondary | ICD-10-CM | POA: Diagnosis not present

## 2021-11-14 DIAGNOSIS — I509 Heart failure, unspecified: Secondary | ICD-10-CM | POA: Diagnosis not present

## 2021-11-14 DIAGNOSIS — M797 Fibromyalgia: Secondary | ICD-10-CM | POA: Diagnosis not present

## 2021-11-14 DIAGNOSIS — K589 Irritable bowel syndrome without diarrhea: Secondary | ICD-10-CM | POA: Diagnosis not present

## 2021-11-14 DIAGNOSIS — D631 Anemia in chronic kidney disease: Secondary | ICD-10-CM | POA: Diagnosis not present

## 2021-11-14 DIAGNOSIS — Z9181 History of falling: Secondary | ICD-10-CM | POA: Diagnosis not present

## 2021-11-14 DIAGNOSIS — S83242D Other tear of medial meniscus, current injury, left knee, subsequent encounter: Secondary | ICD-10-CM | POA: Diagnosis not present

## 2021-11-14 DIAGNOSIS — G43909 Migraine, unspecified, not intractable, without status migrainosus: Secondary | ICD-10-CM | POA: Diagnosis not present

## 2021-11-14 DIAGNOSIS — J9611 Chronic respiratory failure with hypoxia: Secondary | ICD-10-CM | POA: Diagnosis not present

## 2021-11-14 DIAGNOSIS — E1140 Type 2 diabetes mellitus with diabetic neuropathy, unspecified: Secondary | ICD-10-CM | POA: Diagnosis not present

## 2021-11-14 MED ORDER — METHOCARBAMOL 750 MG PO TABS: 1500.0000 mg | ORAL_TABLET | Freq: Three times a day (TID) | ORAL | 2 refills | Status: DC | PRN

## 2021-11-15 ENCOUNTER — Ambulatory Visit (HOSPITAL_COMMUNITY): Payer: Medicare Other | Admitting: Licensed Clinical Social Worker

## 2021-11-22 NOTE — Progress Notes (Signed)
Called pt to follow up on kit.  Pt said she never received it.  Pt would need Urine Albumin Creatinine Ratio test ordered and completed to close gap.

## 2021-11-24 ENCOUNTER — Other Ambulatory Visit: Payer: Self-pay | Admitting: Pulmonary Disease

## 2021-11-24 ENCOUNTER — Other Ambulatory Visit: Payer: Self-pay | Admitting: Internal Medicine

## 2021-11-24 DIAGNOSIS — L732 Hidradenitis suppurativa: Secondary | ICD-10-CM

## 2021-11-24 DIAGNOSIS — R059 Cough, unspecified: Secondary | ICD-10-CM

## 2021-11-26 ENCOUNTER — Other Ambulatory Visit: Payer: Self-pay | Admitting: Internal Medicine

## 2021-11-26 DIAGNOSIS — A084 Viral intestinal infection, unspecified: Secondary | ICD-10-CM

## 2021-11-27 ENCOUNTER — Telehealth: Payer: Self-pay

## 2021-11-27 NOTE — Telephone Encounter (Signed)
Anytime after she completes them to discuss the results.

## 2021-11-27 NOTE — Telephone Encounter (Signed)
Patient called she wants to know when did Dr.Guilloud want her to return after she gets ABI done

## 2021-11-27 NOTE — Telephone Encounter (Signed)
Next appt scheduled 11/28/21 with PCP.

## 2021-11-28 ENCOUNTER — Encounter: Payer: Medicare Other | Admitting: Internal Medicine

## 2021-11-30 ENCOUNTER — Ambulatory Visit (INDEPENDENT_AMBULATORY_CARE_PROVIDER_SITE_OTHER): Payer: Medicare Other | Admitting: Licensed Clinical Social Worker

## 2021-11-30 DIAGNOSIS — F410 Panic disorder [episodic paroxysmal anxiety] without agoraphobia: Secondary | ICD-10-CM | POA: Diagnosis not present

## 2021-11-30 DIAGNOSIS — F411 Generalized anxiety disorder: Secondary | ICD-10-CM | POA: Diagnosis not present

## 2021-11-30 DIAGNOSIS — F331 Major depressive disorder, recurrent, moderate: Secondary | ICD-10-CM | POA: Diagnosis not present

## 2021-11-30 DIAGNOSIS — F4312 Post-traumatic stress disorder, chronic: Secondary | ICD-10-CM

## 2021-11-30 NOTE — Progress Notes (Signed)
Virtual Visit via Video Note  I connected with [name] on 11/30/21 at  8:00 AM EDT by a video enabled telemedicine application and verified that I am speaking with the correct person using two identifiers.  Location: Patient: home Provider: home office   I discussed the limitations of evaluation and management by telemedicine and the availability of in person appointments. The patient expressed understanding and agreed to proceed.  I discussed the assessment and treatment plan with the patient. The patient was provided an opportunity to ask questions and all were answered. The patient agreed with the plan and demonstrated an understanding of the instructions.   The patient was advised to call back or seek an in-person evaluation if the symptoms worsen or if the condition fails to improve as anticipated.  I provided 30 minutes of non-face-to-face time during this encounter.  THERAPIST PROGRESS NOTE  Session Time: 8:00 AM to 8:30 AM  Participation Level: Active  Behavioral Response: CasualAlertDysphoric  Type of Therapy: Individual Therapy  Treatment Goals addressed: Patient continue to utilize therapy for  supportive and strength-based interventions, provide treatment interventions in the context of patient continuing to seek help and cope with medical issues, anxiety, triggers, coping with mental health symptoms, utilize therapy as a way for patient to focus on working through current stressors that also helps to distract from pain  ProgressTowards Goals: Progressing-therapy session focused on treatment goals including support and strength-based, patient being able to share her thoughts and feelings as a coping strategy, also utilizing therapy to distract from pain  Interventions: Solution Focused, Strength-based, Supportive, and Other: coping  Summary: Lindsay [name] is a 51 y.o. female who presents with in a lot of pain where shin connects to knee. She relates the pain so bad  it makes her cry. Rest of leg hurts not like this feeling. API test Monday. Was going to go to doctor on the 1st for some reason patient called and doctor said  don't come to patient gets test.  Therapist noted with patient's pain issues that is good she called as its hard for her to get to appointments with the pain.  Got postcard from [place] and shared how much she appreciates that.  Put it on her wall something she can look at and tell herself it is from [place]. Wants to get something can put it in where sees both sides.  Reviewed how she is being treated for pain has Tramadol since cold not doing anything for it. Robaxin muscle relaxer lately not doing anything. Shares also her throat tickling and nose running. Feel "blah" daughter and niece don't feel well. Up in 5 AM then dosed off.  In session therapist and patient both expressed appreciation for each other and developing relationship is rewarding for both.  Therapist provided space and support for patient to talk about thoughts and feelings in session.  Session Short due to patient's pain issues.  Suicidal/Homicidal: No  Plan: Return again in 2 weeks.2.Therapist processed patient's feelings in session to help with insight and coping  Diagnosis: Major depressive disorder, recurrent, moderate, generalized anxiety disorder, chronic PTSD, panic attacks  Collaboration of Care: Other none needed  Patient/Guardian was advised Release of Information must be obtained prior to any record release in order to collaborate their care with an outside provider. Patient/Guardian was advised if they have not already done so to contact the registration department to sign all necessary forms in order for [name] to release information regarding their care.   Consent: Patient/Guardian  gives verbal consent for treatment and assignment of benefits for services provided during this visit. Patient/Guardian expressed understanding and agreed to proceed.   [name],  LCSW 11/30/2021

## 2021-12-03 ENCOUNTER — Other Ambulatory Visit: Payer: Self-pay | Admitting: Internal Medicine

## 2021-12-03 ENCOUNTER — Telehealth: Payer: Self-pay

## 2021-12-03 ENCOUNTER — Ambulatory Visit (HOSPITAL_COMMUNITY): Payer: Medicare Other

## 2021-12-03 ENCOUNTER — Telehealth: Payer: Self-pay | Admitting: *Deleted

## 2021-12-03 DIAGNOSIS — J9611 Chronic respiratory failure with hypoxia: Secondary | ICD-10-CM

## 2021-12-03 DIAGNOSIS — G894 Chronic pain syndrome: Secondary | ICD-10-CM

## 2021-12-03 MED ORDER — BENZONATATE 100 MG PO CAPS: 100.0000 mg | ORAL_CAPSULE | Freq: Four times a day (QID) | ORAL | 1 refills | Status: DC | PRN

## 2021-12-03 NOTE — Telephone Encounter (Signed)
Patient notified that Rxs [facility] Tessalon Perles [facility]. Explained she would need appt with PCP before any future refills of tramadol sent. States PCP wanted her to return after getting ABIs and they cancelled her appt because she is ill. She will call to r/s ABIs and OV with PCP.

## 2021-12-03 NOTE — Telephone Encounter (Signed)
Provided 1 x refill, needs an appointment for further Rxs.

## 2021-12-03 NOTE — Telephone Encounter (Signed)
Done

## 2021-12-03 NOTE — Telephone Encounter (Signed)
Requesting new nebulizer machine, please call pt back.

## 2021-12-03 NOTE — Telephone Encounter (Signed)
I returned phone call to patient concerning a new nebulizer machine.Patient says she thinks she has the machine for 2-3 years.The machine is not working now. I looked back and saw an order for 01-12-2018. I will send a message to her PCP for a DME order for a new nebulizer machine and send it to [facility] that is where she purchased the [place], [place] C11/6/202312:24 PM

## 2021-12-03 NOTE — Telephone Encounter (Signed)
Placed DME order for nebulizer machine.

## 2021-12-03 NOTE — Telephone Encounter (Signed)
Patient called in stating she is not feeling well. Began with "ticklish throat on Friday." By Saturday, she had HA, body aches, cough productive of yellowish sputum, fever 101. She is 98.8 today after taking Tylenol. Home Covid test is negative. She has not yet received this season's flu vaccine. There are no openings in [facility] today. She is advised to get plenty of rest, drink plenty of fluids, especially water. And head to UC/ED if sx worsen or fail to improve.  [facility]

## 2021-12-06 ENCOUNTER — Telehealth (HOSPITAL_BASED_OUTPATIENT_CLINIC_OR_DEPARTMENT_OTHER): Payer: Medicare Other | Admitting: Psychiatry

## 2021-12-06 ENCOUNTER — Encounter (HOSPITAL_COMMUNITY): Payer: Self-pay | Admitting: Psychiatry

## 2021-12-06 ENCOUNTER — Telehealth (HOSPITAL_COMMUNITY): Payer: Medicare Other | Admitting: Psychiatry

## 2021-12-06 DIAGNOSIS — F4312 Post-traumatic stress disorder, chronic: Secondary | ICD-10-CM | POA: Diagnosis not present

## 2021-12-06 DIAGNOSIS — F331 Major depressive disorder, recurrent, moderate: Secondary | ICD-10-CM | POA: Diagnosis not present

## 2021-12-06 DIAGNOSIS — F410 Panic disorder [episodic paroxysmal anxiety] without agoraphobia: Secondary | ICD-10-CM

## 2021-12-06 DIAGNOSIS — J9620 Acute and chronic respiratory failure, unspecified whether with hypoxia or hypercapnia: Secondary | ICD-10-CM | POA: Diagnosis not present

## 2021-12-06 MED ORDER — CLORAZEPATE DIPOTASSIUM 3.75 MG PO TABS: 3.7500 mg | ORAL_TABLET | Freq: Every day | ORAL | 2 refills | Status: DC

## 2021-12-06 MED ORDER — LAMOTRIGINE 150 MG PO TABS: 150.0000 mg | ORAL_TABLET | Freq: Every day | ORAL | 0 refills | Status: DC

## 2021-12-06 MED ORDER — REXULTI 3 MG PO TABS: 3.0000 mg | ORAL_TABLET | Freq: Every day | ORAL | 0 refills | Status: DC

## 2021-12-06 MED ORDER — MIRTAZAPINE 15 MG PO TABS: 15.0000 mg | ORAL_TABLET | Freq: Every day | ORAL | 0 refills | Status: DC
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
## 2021-12-10 ENCOUNTER — Other Ambulatory Visit: Payer: Self-pay

## 2021-12-10 DIAGNOSIS — G629 Polyneuropathy, unspecified: Secondary | ICD-10-CM

## 2021-12-10 MED ORDER — LIDOCAINE 5 % EX PTCH: 2.0000 | MEDICATED_PATCH | CUTANEOUS | 2 refills | Status: DC

## 2021-12-14 ENCOUNTER — Ambulatory Visit (INDEPENDENT_AMBULATORY_CARE_PROVIDER_SITE_OTHER): Payer: Medicare Other | Admitting: Licensed Clinical Social Worker

## 2021-12-14 DIAGNOSIS — F331 Major depressive disorder, recurrent, moderate: Secondary | ICD-10-CM

## 2021-12-14 DIAGNOSIS — F411 Generalized anxiety disorder: Secondary | ICD-10-CM | POA: Diagnosis not present

## 2021-12-14 DIAGNOSIS — F4312 Post-traumatic stress disorder, chronic: Secondary | ICD-10-CM

## 2021-12-14 DIAGNOSIS — F410 Panic disorder [episodic paroxysmal anxiety] without agoraphobia: Secondary | ICD-10-CM

## 2021-12-14 NOTE — Progress Notes (Signed)
Virtual Visit via Video Note  I connected with Lindsay [name] on 12/14/21 at  8:00 AM EST by a video enabled telemedicine application and verified that I am speaking with the correct person using two identifiers.  Location: Patient: home Provider: home office   I discussed the limitations of evaluation and management by telemedicine and the availability of in person appointments. The patient expressed understanding and agreed to proceed.   I discussed the assessment and treatment plan with the patient. The patient was provided an opportunity to ask questions and all were answered. The patient agreed with the plan and demonstrated an understanding of the instructions.   The patient was advised to call back or seek an in-person evaluation if the symptoms worsen or if the condition fails to improve as anticipated.  I provided 52 minutes of non-face-to-face time during this encounter.  THERAPIST PROGRESS NOTE  Session Time: 8:00 AM to 8:52 AM  Participation Level: Active  Behavioral Response: CasualAlertAnxious and Dysphoric-pain issues, appropriate in session  Type of Therapy: Individual Therapy  Treatment Goals addressed: Patient continue to utilize therapy for  supportive and strength-based interventions, provide treatment interventions in the context of patient continuing to seek help and cope with medical issues, anxiety, triggers, coping with mental health symptoms, utilize therapy as a way for patient to focus on working through current stressors that also helps to distract from pain  ProgressTowards Goals: Progressing-focused on coping with medical issues noting some strategies such as being more active supplements may be helpful, based on positive subjects helpful for distraction and mood  Interventions: Solution Focused, Strength-based, Supportive, and Other: coping  Summary: Lindsay [name] is a 51 y.o. female who presents with second week of having cold. When talking to Dr.  Adele [name] leg not hurting but came back. Was taking vitamins to built up immune tumeric and cumin, supplements for bone health and something for nervous system, joint system. Going to ask doctor about tumeric before buy more make sure on same page. Which ones contribute to less pain. Lasted 3-4 days. Slept a little better. Doesn't want to take anything that would mess with her heart.  Therapist reviewed med list to see which ones were on med list asked her about different medications patient explained capsaicin-mixture of oils hot peppers mix with Voltaren and holy oil. Lidocaine put on bottom of feet. Works on bottom of feet takes numbing and tinging away can feel feet normally can't feel feet. Even during day uses. Getting around a little more. Went to the store and walking around there didn't have motorized scooter. Aid quit took as shock because of aid's health and not getting paid. Sign up with another [place] where the aid worked hadn't heard from them. New company oversees heath care aid. Somebody coming Monday. These people can't run errands don't know what going to do panic attacks when think about it. Important to pay rent. Even though hurting patient says plans to push herself to get out and go anywhere. Even outside.Therapist noted importance of lifestyle in helping mental health. She is without Ozempic-shortage-hungry at night. Does a diet a plan of nutrition intermittent fasting.  Pain medicine is Tramadol. Have to move feet because of pain. Last week no pain at all. Pain has come back in back of leg, knees back and buttock. Normally don't take tumeric and cumin or combination of vitamins for joint, nerves and bone health.  That could have positively impacted her pain issues.  Talking to therapist a distraction.  Focus on therapist rather then laying there and wallowing in pain. Capsaicin distraction can feel penetrating the skin sometimes a burning sensation. Using mind anything to not be in  pain. Nothing illegal. Something off street scary. CBD won't give her the Tramadol. Talk about what cooking for thanksgiving. Thought about cousin like brother medical transportation company take her to the store until figure something out. He says does it and then doesn't do it which pisses her off.          Therapist reviewed thoughts and feelings provided space and support for patient helping with life events, coping with stressors.  Noted positive news of not having pain for a few days when taking supplements therapist feedback was she may be on just something significant patient is going to review with her doctor.  Another positive news was that she is getting around a little bit more.  Feedback to patient that she said she is going to push herself to get out even if it is outside therapist thinks that we will have a positive affect help her move toward being healthier.  Focused on positive subjects like things giving as patient says helps with distraction to talk about different subjects.  Dealt with significant stressor has a new aide coming in and told they cannot do errands brainstorm with patient on some options to pursue the same time validating her on it being stressful.  Therapist provided active listening open questions supportive interventions and strength-based interventions noting patient addressing health issues shows resilience and strength Suicidal/Homicidal: No  Plan: Return again in 2 weeks.2.Therapist processed patient's feelings in session to help with insight and coping  Diagnosis: Major depressive disorder, recurrent, moderate, generalized anxiety disorder, chronic PTSD, panic attacks  Collaboration of Care: Other none needed  Patient/Guardian was advised Release of Information must be obtained prior to any record release in order to collaborate their care with an outside provider. Patient/Guardian was advised if they have not already done so to contact the registration  department to sign all necessary forms in order for [name] to release information regarding their care.   Consent: Patient/Guardian gives verbal consent for treatment and assignment of benefits for services provided during this visit. Patient/Guardian expressed understanding and agreed to proceed.   [name], LCSW 12/14/2021

## 2021-12-27 ENCOUNTER — Ambulatory Visit (INDEPENDENT_AMBULATORY_CARE_PROVIDER_SITE_OTHER): Payer: Medicare Other | Admitting: Student

## 2021-12-27 DIAGNOSIS — Z87891 Personal history of nicotine dependence: Secondary | ICD-10-CM

## 2021-12-27 DIAGNOSIS — R251 Tremor, unspecified: Secondary | ICD-10-CM | POA: Diagnosis not present

## 2021-12-27 DIAGNOSIS — J9611 Chronic respiratory failure with hypoxia: Secondary | ICD-10-CM | POA: Diagnosis not present

## 2021-12-27 DIAGNOSIS — J449 Chronic obstructive pulmonary disease, unspecified: Secondary | ICD-10-CM

## 2021-12-27 NOTE — Assessment & Plan Note (Signed)
Patient notes a 77-monthhistory of right-sided tremor that occurs mainly at rest in her right hand, right leg and intermittently in her tongue.  She notes that it has been stable and occurs most days but does not notice anything that makes it worse including time of day.  She notes that whenever she starts moving the tremor does go away.  She denies any other associated symptoms or recent worsening of symptoms.  She will have this further evaluated at her next visit on 01/09/2022.

## 2021-12-27 NOTE — Progress Notes (Signed)
Internal Medicine Clinic Attending  Case discussed with Dr. Nikki Dom  At the time of the visit.  We reviewed the resident's history and exam and pertinent patient test results.  I agree with the assessment, diagnosis, and plan of care documented in the resident's note.

## 2021-12-27 NOTE — Assessment & Plan Note (Deleted)
Patient has a history of chronic respiratory failure with hypoxia likely secondary to OSA/OHS.  She uses home oxygen and nebulized medications with benefit.  Recently she has not had her nebulizer due to equipment breaking.  She needs a new nebulizer and supplies.  This patient is a 51 year old female

## 2021-12-27 NOTE — Addendum Note (Signed)
Addended by: Johny Blamer on: 12/27/2021 01:11 PM   Modules accepted: Orders

## 2021-12-27 NOTE — Progress Notes (Signed)
  [facility] Internal Medicine Residency Telephone Encounter Continuity Care Appointment  HPI:  This telephone encounter was created for [name] on 12/27/2021 for the following purpose/cc reevaluation for nebulizer machine and supplies after her current machine broke down.  She states she has been sick recently with a cough that is slightly improved but still bothersome.  She states that her nebulized medications including albuterol worked a lot better for her cough than her current inhaler does.  She was also complaining of a tremor that she first noticed in June 2023.  Tremor is mainly on the right side and occurs primarily at rest.  She denies any recent worsening or other associated symptoms at this time.  I advised her that it is difficult to assess her tremor during a telehealth visit and she is happy to discuss this with Dr. Philipp Ovens at her next visit on 01/09/2022.   Past Medical History:  Past Medical History:  Diagnosis Date   Acid reflux    Anemia    Iron Def   Anorexia    CHF (congestive heart failure) ([place])    Chronic kidney disease    Nephrotic syndrome   Colon polyp 2009   Depression with anxiety    Edema leg    Fibromyalgia    Hemorrhoids    Hidradenitis suppurativa    Hypertension    IBS (irritable bowel syndrome)    Low back pain    Migraines    Morbidly obese (HCC)    Neuromuscular disorder (HCC)    fibromyalgia   Neuropathy    Panic attacks    Polyp of vocal cord or larynx    Tonsil pain      ROS:  Positive for cough and intermittent tremor.     Assessment / Plan / Recommendations:  Please see A&P under problem oriented charting for assessment of the patient's acute and chronic medical conditions.  As always, pt is advised that if symptoms worsen or new symptoms arise, they should go to an urgent care facility or to to ER for further evaluation.   Consent and Medical Decision Making:  Patient discussed with Dr. Philipp Ovens This is a telephone  encounter between Lindsay [name] and Johny Blamer on 12/27/2021 for evaluation for reordering of a nebulizer machine and supplies. The visit was conducted with the patient located at home and Johny Blamer at [facility]. The patient's identity was confirmed using their DOB and current address. The patient has consented to being evaluated through a telephone encounter and understands the associated risks (an examination cannot be done and the patient may need to come in for an appointment) / benefits (allows the patient to remain at home, decreasing exposure to coronavirus). I personally spent 15 minutes on medical discussion.

## 2021-12-27 NOTE — Assessment & Plan Note (Signed)
Patient has a history of COPD and chronic respiratory failure with hypoxia likely secondary to OSA/OHS.  She uses home oxygen and nebulized medications including albuterol with benefit.  Recently she has not had her nebulizer due to equipment breaking.  She needs a new nebulizer and supplies.  Based upon today's evaluation and her history I believe she does need a nebulizer and supplies for treatment of her COPD and nebulized albuterol.  Orders been placed for DME nebulizer machine with supplies for home use.

## 2021-12-28 ENCOUNTER — Ambulatory Visit (INDEPENDENT_AMBULATORY_CARE_PROVIDER_SITE_OTHER): Payer: Medicare Other | Admitting: Licensed Clinical Social Worker

## 2021-12-28 DIAGNOSIS — F4312 Post-traumatic stress disorder, chronic: Secondary | ICD-10-CM

## 2021-12-28 DIAGNOSIS — F411 Generalized anxiety disorder: Secondary | ICD-10-CM | POA: Diagnosis not present

## 2021-12-28 DIAGNOSIS — F410 Panic disorder [episodic paroxysmal anxiety] without agoraphobia: Secondary | ICD-10-CM | POA: Diagnosis not present

## 2021-12-28 DIAGNOSIS — F331 Major depressive disorder, recurrent, moderate: Secondary | ICD-10-CM

## 2021-12-28 NOTE — Progress Notes (Signed)
Virtual Visit via Video Note  I connected with Lindsay [name] on 12/28/21 at  8:00 AM EST by a video enabled telemedicine application and verified that I am speaking with the correct person using two identifiers.  Location: Patient: home Provider: home office   I discussed the limitations of evaluation and management by telemedicine and the availability of in person appointments. The patient expressed understanding and agreed to proceed.   I discussed the assessment and treatment plan with the patient. The patient was provided an opportunity to ask questions and all were answered. The patient agreed with the plan and demonstrated an understanding of the instructions.   The patient was advised to call back or seek an in-person evaluation if the symptoms worsen or if the condition fails to improve as anticipated.  I provided 40 minutes of non-face-to-face time during this encounter.  THERAPIST PROGRESS NOTE  Session Time: 8:00 AM to 8:40 AM  Participation Level: Active  Behavioral Response: CasualAlertappropriate in session but pain issues because some dysphoria  Type of Therapy: Individual Therapy  Treatment Goals addressed: Patient continue to utilize therapy for  supportive and strength-based interventions, provide treatment interventions in the context of patient continuing to seek help and cope with medical issues, anxiety, triggers, coping with mental health symptoms, utilize therapy as a way for patient to focus on working through current stressors that also helps to distract from pain  ProgressTowards Goals: Progressing-assess helpful for patient to process thoughts and feelings in session to help with coping  Interventions: Solution Focused, Strength-based, Supportive, and Other: coping  Summary: Lindsay [name] is a 51 y.o. female who presents with leg hurts in front where joint connects putting IcyHot to help. The pain stopped for a week. Her backbone and knee not hurting. Just  bought tumeric and cumarin.  Therapist noted supplements that had really helped encouraged they may still help patient still cannot talk to the doctor about it.  She has new aid. She does a lot helps her to wash up and take a shower, wash dishes, sweep and mop floor, make the bed. She got the medicines and bread and some vitamins. Concerned about asking her to do something she is not supposed to do. Figuring out how to pay the rent. She would do that errand and go to the store. Doesn't want her to get caught. She offered to get patient something eat.  Patient says with knee bothering her may be conscious don't want her to get in trouble.  Therapist explored with patient decided bring it up and say do not want her to get in trouble and see which she says.  Maybe pick and choose what patient asked this therapist can see needing help with some of these jobs. Concern since June laying down and sitting up. She starts to tremble lay down and not doing anything asked doctor. The doctor ordering the nebulizer. Didn't get a good explanation. Daughter explained difference between tremor and tremble. Because of mini-strokes she believes patient has had has tremors. Patient is going to consult with doctor make sure what that is and then if she needs to talk to Dr. Adele Schilder who asked her about tremors. Thinks getting sick again. Doesn't want it to be like it was two weeks ago. Has been getting up more but today is not a good day. Birthday Oncologist down. Daughter gave her a heating blanket  pair of pants two shirts uncle is getting cake.   Cooked Thanksgiving and sisters helped.  Had  a good Thanksgiving. Therapist provided space and support for patient to talk about thoughts and feelings assess helpful managing emotions as well as stressors.  Noted positive rapport with daughter when she was in session.  This was encouraging in terms of supplements noting some positive results and hope that that can continue.  Other  positive is an aid who is helping her out a lot more patient conflict of asking her to do things how to address the issue decided to bring it up to aid to see what she thinks.  Therapist provided active listening open questions supportive interventions  Suicidal/Homicidal: No  Plan: Return again in 2 weeks.2.Therapist processed patient's feelings in session to help with insight and coping  Diagnosis: Major depressive disorder, recurrent, moderate, generalized anxiety disorder, chronic PTSD, panic attacks   Collaboration of Care: Other none needed  Patient/Guardian was advised Release of Information must be obtained prior to any record release in order to collaborate their care with an outside provider. Patient/Guardian was advised if they have not already done so to contact the registration department to sign all necessary forms in order for [name] to release information regarding their care.   Consent: Patient/Guardian gives verbal consent for treatment and assignment of benefits for services provided during this visit. Patient/Guardian expressed understanding and agreed to proceed.   [name], LCSW 12/28/2021

## 2022-01-01 ENCOUNTER — Other Ambulatory Visit: Payer: Self-pay | Admitting: Internal Medicine

## 2022-01-01 DIAGNOSIS — L732 Hidradenitis suppurativa: Secondary | ICD-10-CM

## 2022-01-01 DIAGNOSIS — A084 Viral intestinal infection, unspecified: Secondary | ICD-10-CM

## 2022-01-01 MED ORDER — BENZONATATE 100 MG PO CAPS: 100.0000 mg | ORAL_CAPSULE | Freq: Four times a day (QID) | ORAL | 1 refills | Status: DC | PRN

## 2022-01-01 NOTE — Telephone Encounter (Signed)
Patient called in stating she's had a bad cough x 5 days productive of clear to light yellow sputum. "Rattling in chest," runny nose, temp 99-100.1. Denies increased SHOB. Using albuterol 2 puffs q 6h, symbicort as instructed; has not yet received neb/compressor. Also, "gagging from coughing." She is requesting refills on Tessalon Perles and zofran. Has already received all refills sent 11/6 and 10/31.  CM sent to [facility] at [place] for neb/compressor. F2F was 12/27/21.

## 2022-01-01 NOTE — Telephone Encounter (Signed)
Approved topical clinda for hidradenitis. Will address at F/u.

## 2022-01-01 NOTE — Progress Notes (Signed)
[facility] Quality Team Note  Name: Lindsay [name] Date of Birth: [birth year] MRN: [id number] Date: 01/01/2022  [facility] Quality Team has reviewed this patient's chart, please see recommendations below:  [facility] Quality Other; (KED GAP: KIDNEY HEALTH EVALUATION- PATIENT NEEDS URINE MICROALBUMIN CREATININE RATIO COMPLETED BEFORE END OF YEAR FOR GAP CLOSURE)

## 2022-01-01 NOTE — Telephone Encounter (Signed)
Next appt scheduled 01/09/22 with PCP.

## 2022-01-02 NOTE — Telephone Encounter (Signed)
Eulas Post, Jasmine  Dynasti Kerman, Orvis Brill, RN; Stephannie Peters; [place], [place]; [place], Eleonore Chiquito, [place]; 1 other  received!

## 2022-01-05 DIAGNOSIS — J9620 Acute and chronic respiratory failure, unspecified whether with hypoxia or hypercapnia: Secondary | ICD-10-CM | POA: Diagnosis not present

## 2022-01-08 ENCOUNTER — Other Ambulatory Visit: Payer: Self-pay | Admitting: Internal Medicine

## 2022-01-08 DIAGNOSIS — G894 Chronic pain syndrome: Secondary | ICD-10-CM

## 2022-01-09 ENCOUNTER — Encounter: Payer: Self-pay | Admitting: Internal Medicine

## 2022-01-09 ENCOUNTER — Ambulatory Visit (INDEPENDENT_AMBULATORY_CARE_PROVIDER_SITE_OTHER): Payer: Medicare Other | Admitting: Internal Medicine

## 2022-01-09 ENCOUNTER — Other Ambulatory Visit: Payer: Self-pay

## 2022-01-09 VITALS — BP 123/75 | HR 94 | Temp 99.2°F | Ht 70.0 in | Wt 343.3 lb

## 2022-01-09 DIAGNOSIS — M797 Fibromyalgia: Secondary | ICD-10-CM | POA: Diagnosis not present

## 2022-01-09 DIAGNOSIS — M79605 Pain in left leg: Secondary | ICD-10-CM | POA: Diagnosis not present

## 2022-01-09 DIAGNOSIS — R251 Tremor, unspecified: Secondary | ICD-10-CM | POA: Diagnosis not present

## 2022-01-09 DIAGNOSIS — Z23 Encounter for immunization: Secondary | ICD-10-CM

## 2022-01-09 DIAGNOSIS — I10 Essential (primary) hypertension: Secondary | ICD-10-CM | POA: Diagnosis not present

## 2022-01-09 DIAGNOSIS — E1121 Type 2 diabetes mellitus with diabetic nephropathy: Secondary | ICD-10-CM

## 2022-01-09 DIAGNOSIS — Z79891 Long term (current) use of opiate analgesic: Secondary | ICD-10-CM | POA: Diagnosis not present

## 2022-01-09 DIAGNOSIS — G894 Chronic pain syndrome: Secondary | ICD-10-CM

## 2022-01-09 DIAGNOSIS — E559 Vitamin D deficiency, unspecified: Secondary | ICD-10-CM

## 2022-01-09 DIAGNOSIS — M79604 Pain in right leg: Secondary | ICD-10-CM

## 2022-01-09 DIAGNOSIS — G8929 Other chronic pain: Secondary | ICD-10-CM | POA: Diagnosis not present

## 2022-01-09 DIAGNOSIS — E611 Iron deficiency: Secondary | ICD-10-CM

## 2022-01-09 DIAGNOSIS — E7841 Elevated Lipoprotein(a): Secondary | ICD-10-CM | POA: Diagnosis not present

## 2022-01-09 DIAGNOSIS — F1190 Opioid use, unspecified, uncomplicated: Secondary | ICD-10-CM | POA: Diagnosis not present

## 2022-01-09 DIAGNOSIS — Z975 Presence of (intrauterine) contraceptive device: Secondary | ICD-10-CM | POA: Insufficient documentation

## 2022-01-09 LAB — GLUCOSE, CAPILLARY: Glucose-Capillary: 92 mg/dL (ref 70–99)

## 2022-01-09 LAB — POCT GLYCOSYLATED HEMOGLOBIN (HGB A1C): Hemoglobin A1C: 5.8 % — AB (ref 4.0–5.6)

## 2022-01-09 MED ORDER — TRAMADOL HCL 50 MG PO TABS: 50.0000 mg | ORAL_TABLET | ORAL | 0 refills | Status: DC | PRN

## 2022-01-09 MED ORDER — KETOROLAC TROMETHAMINE 60 MG/2ML IM SOLN
60.0000 mg | Freq: Once | INTRAMUSCULAR | Status: AC
  Administered 2022-01-09: 60 mg via INTRAMUSCULAR

## 2022-01-09 MED ORDER — CETIRIZINE HCL 10 MG PO TABS: 10.0000 mg | ORAL_TABLET | Freq: Every day | ORAL | 1 refills | Status: DC

## 2022-01-09 NOTE — Assessment & Plan Note (Addendum)
Patient has a new intermittent tremor of her right thumb that comes and goes and sometimes involves her right lower extremity and tongue. On exam today, tremor is noted in her right thumb at rest and disappears with action. There is no involvement of the leg or tongue currently. The tremor is mild and not interfering with functionality, but is distressing for her. She does have a history of cerebellar CVAs however these are remote and the tremor is new. Cerebellar disease usually causes an action tremor from my understanding so I do not think this would be consistent. She takes brexpiprazole per psych but has been on this for many years, no other signs of dystonia or dystonic posture. Her only new medication recently is tramdol. Tramdol is serotonergic and although uncommon can cause a drug induced parkinsonism. Unfortunately I did not realize this until after the office visit and did not check reflexes. Her temperature today was 99.2 but reports symptoms of URI; she also takes zofran PRN and has been taking OTC nyquil and mucinex sinus-max, both of which have dextromethorphan. All of these medications are serotonergic. I called patient after the visit and instructed her to D/C the over the counter medications, and to call back if she continues to have fever or worsening tremor. We may need to discontinue tramadol if tremors do not improve. Monitor closely for serotonin syndrome.

## 2022-01-09 NOTE — Assessment & Plan Note (Signed)
Currently taking every other day iron supplementation.  Checking iron panel today.

## 2022-01-09 NOTE — Assessment & Plan Note (Signed)
Follow-up repeat vitamin D level.

## 2022-01-09 NOTE — Progress Notes (Signed)
Subjective:   Patient ID: Lindsay [name] female   DOB: 1970-07-18 51 y.o.   MRN: [id number]  HPI: Lindsay [name] is a 51 y.o. female with past medical history outlined below here for follow up of diabetes and chronic pain. For further details of today's visit, please refer to the assessment and plan below.  Past Medical History:  Diagnosis Date   Acid reflux    Anemia    Iron Def   Anorexia    CHF (congestive heart failure) (HCC)    Chronic kidney disease    Nephrotic syndrome   Colon polyp 2009   Depression with anxiety    Edema leg    Fibromyalgia    Hemorrhoids    Hidradenitis suppurativa    Hypertension    IBS (irritable bowel syndrome)    Low back pain    Migraines    Morbidly obese (HCC)    Neuromuscular disorder (HCC)    fibromyalgia   Neuropathy    Panic attacks    Polyp of vocal cord or larynx    Tonsil pain    Current Outpatient Medications  Medication Sig Dispense Refill   ACCU-CHEK GUIDE test strip USE ONE TEST STRIP TO CHECK BLOOD SUGARS ONCE A DAY AS NEEDED 100 strip 1   Accu-Chek Softclix Lancets lancets USE ONE LANCET TO CHECK BLOOD SUGAR ONE A DAY AS NEEDED 100 each 1   ADDYI 100 MG TABS TAKE 1 TABLET BY MOUTH EVERY DAY 30 tablet 5   albuterol (PROVENTIL) (2.5 MG/3ML) 0.083% nebulizer solution TAKE 3 ML (2.5 MG TOTAL) BY NEBULIZATION EVERY 4 HOURS AS NEEDED FOR WHEEZING OR SHORTNESS OF BREATH 150 mL 0   albuterol (VENTOLIN HFA) 108 (90 Base) MCG/ACT inhaler TAKE 2 PUFFS BY MOUTH EVERY 6 HOURS AS NEEDED FOR WHEEZE OR SHORTNESS OF BREATH 8.5 each 0   Ascorbic Acid (VITAMIN C PO) Take 1 tablet by mouth daily.     atorvastatin (LIPITOR) 40 MG tablet Take 1 tablet (40 mg total) by mouth daily. 90 tablet 3   benzonatate (TESSALON PERLES) 100 MG capsule Take 1 capsule (100 mg total) by mouth every 6 (six) hours as needed for cough. 30 capsule 1   Blood Glucose Monitoring Suppl (ACCU-CHEK GUIDE ME) w/Device KIT Dispense one device 1 kit 0   Brexpiprazole  (REXULTI) 3 MG TABS Take 1 tablet (3 mg total) by mouth daily. 90 tablet 0   Capsaicin 0.025 % PTCH Apply 1 patch topically daily as needed. 10 patch 9   cetirizine (ZYRTEC) 10 MG tablet Take 1 tablet (10 mg total) by mouth daily. 90 tablet 1   clindamycin (CLINDAGEL) 1 % gel APPLY TO AFFECTED AREA TWICE A DAY 30 g 0   clorazepate (TRANXENE) 3.75 MG tablet Take 1 tablet (3.75 mg total) by mouth daily. 30 tablet 2   D3-1000 25 MCG (1000 UT) capsule TAKE 1 CAPSULE BY MOUTH EVERY DAY 90 capsule 1   diclofenac Sodium (VOLTAREN) 1 % GEL APPLY 2 GRAMS TOPICALLY 4 (FOUR) TIMES DAILY AS NEEDED FOR KNEE PAIN 400 g 1   ENTRESTO 49-51 MG Take 1 tablet by mouth 2 (two) times daily.     famotidine (PEPCID) 20 MG tablet TAKE 1 TABLET BY MOUTH EVERY DAY 90 tablet 1   ferrous sulfate 325 (65 FE) MG EC tablet Take 1 tablet (325 mg total) by mouth daily with breakfast. 90 tablet 1   ipratropium (ATROVENT) 0.06 % nasal spray PLEASE SEE ATTACHED FOR DETAILED DIRECTIONS  lamoTRIgine (LAMICTAL) 150 MG tablet Take 1 tablet (150 mg total) by mouth daily. 90 tablet 0   lidocaine (LIDODERM) 5 % Place 2 patches onto the skin daily. Remove & Discard patch within 12 hours or as directed by MD 60 patch 2   lidocaine (XYLOCAINE) 5 % ointment Apply 1 application topically as needed for mild pain. 50 g 0   meclizine (ANTIVERT) 25 MG tablet TAKE 1 TABLET BY MOUTH THREE TIMES A DAY AS NEEDED FOR DIZZINESS 90 tablet 2   megestrol (MEGACE) 40 MG tablet Take 2 tablets (80 mg total) by mouth daily. 180 tablet 1   methocarbamol (ROBAXIN) 750 MG tablet Take 2 tablets (1,500 mg total) by mouth every 8 (eight) hours as needed for muscle spasms. 180 tablet 2   metoprolol succinate (TOPROL-XL) 50 MG 24 hr tablet Take 1 tablet (50 mg total) by mouth daily. 90 tablet 3   mirtazapine (REMERON) 15 MG tablet Take 1 tablet (15 mg total) by mouth at bedtime. 90 tablet 0   omeprazole (PRILOSEC) 40 MG capsule TAKE 1 CAPSULE (40 MG TOTAL) BY MOUTH  DAILY. 90 capsule 3   ondansetron (ZOFRAN-ODT) 8 MG disintegrating tablet TAKE 1 TABLET BY MOUTH EVERY 12 HOURS AS NEEDED 10 tablet 1   Semaglutide, 2 MG/DOSE, (OZEMPIC, 2 MG/DOSE,) 8 MG/3ML SOPN Inject 2 mg into the skin once a week. 3 mL 3   SYMBICORT 160-4.5 MCG/ACT inhaler INHALE 2 PUFFS INTO THE LUNGS IN THE MORNING AND AT BEDTIME. 10.2 each 0   torsemide (DEMADEX) 20 MG tablet TAKE 2 TABLETS BY MOUTH TWICE A DAY 360 tablet 1   traMADol (ULTRAM) 50 MG tablet Take 1 tablet (50 mg total) by mouth every 4 (four) hours as needed. 180 tablet 0   vitamin B-12 (CYANOCOBALAMIN) 1000 MCG tablet Take 1 tablet (1,000 mcg total) by mouth daily. 30 tablet 3   Vitamin E 400 units TABS Take 400 Units by mouth daily.      No current facility-administered medications for this visit.   Family History  Problem Relation Age of Onset   Diabetes Mother    Heart disease Mother        valve leak   Anxiety disorder Mother    Depression Mother    High blood pressure Mother    Kidney failure Mother    Cancer Father        prostate   Heart disease Father    Learning disabilities Sister    Depression Sister    Depression Sister    Anxiety disorder Sister    Colon cancer Neg Hx    Social History   Socioeconomic History   Marital status: Single    Spouse name: Not on file   Number of children: 1   Years of education: 11   Highest education level: Not on file  Occupational History   Occupation: Disabled  Tobacco Use   Smoking status: Former    Packs/day: 0.10    Years: 25.00    Total pack years: 2.50    Types: Cigarettes    Quit date: 05/24/2019    Years since quitting: 2.6   Smokeless tobacco: Never   Tobacco comments:    quit in April.  Vaping Use   Vaping Use: Never used  Substance and Sexual Activity   Alcohol use: Not Currently    Alcohol/week: 0.0 standard drinks of alcohol    Comment: none sine 05/2018   Drug use: Not Currently    Frequency: 3.0  times per week    Types: Marijuana    Sexual activity: Yes    Birth control/protection: Implant  Other Topics Concern   Not on file  Social History Narrative   Lives at home w/ her mother and daughter   Right-handed   Caffeine: about 3 Cokes per week   Social Determinants of Health   Financial Resource Strain: High Risk (08/14/2021)   Overall Financial Resource Strain (CARDIA)    Difficulty of Paying Living Expenses: Very hard  Food Insecurity: Food Insecurity Present (08/14/2021)   Hunger Vital Sign    Worried About Running Out of Food in the Last Year: Often true    Ran Out of Food in the Last Year: Often true  Transportation Needs: Unmet Transportation Needs (08/14/2021)   PRAPARE - Transportation    Lack of Transportation (Medical): Yes    Lack of Transportation (Non-Medical): Yes  Physical Activity: Insufficiently Active (08/14/2021)   Exercise Vital Sign    Days of Exercise per Week: 1 day    Minutes of Exercise per Session: 40 min  Stress: No Stress Concern Present (08/14/2021)   [place]    Feeling of Stress : Only a little  Social Connections: Moderately Isolated (08/14/2021)   Social Connection and Isolation Panel [NHANES]    Frequency of Communication with Friends and Family: Never    Frequency of Social Gatherings with Friends and Family: More than three times a week    Attends Religious Services: 1 to 4 times per year    Active Member of [employer] or Organizations: No    Attends Archivist Meetings: Never    Marital Status: Never married     Objective:  Physical Exam:  Vitals:   01/09/22 0905  BP: 123/75  Pulse: 94  Temp: 99.2 F (37.3 C)  TempSrc: Oral  SpO2: 98%  Weight: (!) 343 lb 4.8 oz (155.7 kg)  Height: _0  (1.778 m)    Constitutional: Obese, NAD Cardiovascular: RRR, no m/r/g Pulmonary/Chest: Distant breath sounds, normal effort Extremities: Warm, no edema Neurological: Right thumb with resting tremor  that disappears with action, normal finger to nose.   Assessment & Plan:   Chronic, continuous use of opioids Patient is here for follow-up of her chronic pain.  She is doing well on tramadol 50 mg every 4 hours as needed and is taking this as prescribed.  She continues to have pain however I emphasized my goal to improve functionality and reminded her that we will not be able to completely take her pain away. Plan to continue with current dose. Utox collected today. Reviewed PDMP, sent three Rx to her pharmacy.   Chronic leg pain Patient is having a flair of her acute on chronic leg pain, requesting IM toradol. Renal function is normal, gave 60 mg IM x 1.   Diabetes ([place]) Hemoglobin A1c is well controlled today at 5.8.  She is currently taking semaglutide, prescribed 2 mg weekly however there is currently a backorder at her pharmacy and she has been receiving the 1 mg dose instead.  She is down 20 pounds since her last appointment.  Weight is 343 pounds today with a BMI of 49.2. Starting weight was 371 she is down 27 lbs overall.  She has had an excellent response to Ozempic.  Plan to continue this and increase back to the 2 mg dose once this is available.  Essential hypertension Chronic and well-controlled on Entresto, metoprolol, and torsemide.  Checking CMP today.  HLD (hyperlipidemia) Patient is on Lipitor 40 mg daily in the setting of prior CVAs for secondary prevention.  Checking lipid panel today.  Iron deficiency Currently taking every other day iron supplementation.  Checking iron panel today.  Vitamin D deficiency, unspecified Follow-up repeat vitamin D level.   Tremor Patient has a new intermittent tremor of her right thumb that comes and goes and sometimes involves her right lower extremity and tongue. On exam today, tremor is noted in her right thumb at rest and disappears with action. There is no involvement of the leg or tongue currently. The tremor is mild and not  interfering with functionality, but is distressing for her. She does have a history of cerebellar CVAs however these are remote and the tremor is new. Cerebellar disease usually causes an action tremor from my understanding so I do not think this would be consistent. She takes brexpiprazole per psych but has been on this for many years, no other signs of dystonia or dystonic posture. Her only new medication recently is tramdol. Tramdol is serotonergic and although uncommon can cause a drug induced parkinsonism. Unfortunately I did not realize this until after the office visit and did not check reflexes. Her temperature today was 99.2 but reports symptoms of URI; she also takes zofran PRN and has been taking OTC nyquil and mucinex sinus-max, both of which have dextromethorphan. All of these medications are serotonergic. I called patient after the visit and instructed her to D/C the over the counter medications, and to call back if she continues to have fever or worsening tremor. We may need to discontinue tramadol if tremors do not improve. Monitor closely for serotonin syndrome.   Nexplanon in place Having irregular menstrual bleeding, requesting referral to OBGYN (Dr. Kennon Rounds) for removal.

## 2022-01-09 NOTE — Progress Notes (Signed)
Copy of nControlled Medication Treatment Agreement given to pt.

## 2022-01-09 NOTE — Assessment & Plan Note (Signed)
Having irregular menstrual bleeding, requesting referral to OBGYN (Dr. Kennon Rounds) for removal.

## 2022-01-09 NOTE — Assessment & Plan Note (Signed)
Hemoglobin A1c is well controlled today at 5.8.  She is currently taking semaglutide, prescribed 2 mg weekly however there is currently a backorder at her pharmacy and she has been receiving the 1 mg dose instead.  She is down 20 pounds since her last appointment.  Weight is 343 pounds today with a BMI of 49.2. Starting weight was 371 she is down 27 lbs overall.  She has had an excellent response to Ozempic.  Plan to continue this and increase back to the 2 mg dose once this is available.

## 2022-01-09 NOTE — Assessment & Plan Note (Signed)
Patient is on Lipitor 40 mg daily in the setting of prior CVAs for secondary prevention.  Checking lipid panel today.

## 2022-01-09 NOTE — Assessment & Plan Note (Signed)
Patient is having a flair of her acute on chronic leg pain, requesting IM toradol. Renal function is normal, gave 60 mg IM x 1.

## 2022-01-09 NOTE — Patient Instructions (Signed)
Ms. Golonka,  It was a pleasure to see you. I will send you a mychart message if your labs are normal, if anything is abnormal or needs follow up I will give you a call. Please continue to take your medications as prescribed.  Follow up with me again in 3 months or sooner if needed. If you have any questions or concerns, call our clinic at [phone] or after hours call [phone] and ask for the internal medicine resident on call.   Thank you!  Dr. Darnell Level

## 2022-01-09 NOTE — Assessment & Plan Note (Signed)
Chronic and well-controlled on Entresto, metoprolol, and torsemide.  Checking CMP today.

## 2022-01-09 NOTE — Assessment & Plan Note (Signed)
Patient is here for follow-up of her chronic pain.  She is doing well on tramadol 50 mg every 4 hours as needed and is taking this as prescribed.  She continues to have pain however I emphasized my goal to improve functionality and reminded her that we will not be able to completely take her pain away. Plan to continue with current dose. Utox collected today. Reviewed PDMP, sent three Rx to her pharmacy.

## 2022-01-10 ENCOUNTER — Encounter: Payer: Self-pay | Admitting: *Deleted

## 2022-01-10 LAB — CMP14 + ANION GAP
ALT: 11 IU/L (ref 0–32)
AST: 10 IU/L (ref 0–40)
Albumin/Globulin Ratio: 1.7 (ref 1.2–2.2)
Albumin: 4.7 g/dL (ref 3.8–4.9)
Alkaline Phosphatase: 90 IU/L (ref 44–121)
Anion Gap: 18 mmol/L (ref 10.0–18.0)
BUN/Creatinine Ratio: 9 (ref 9–23)
BUN: 8 mg/dL (ref 6–24)
Bilirubin Total: 0.2 mg/dL (ref 0.0–1.2)
CO2: 19 mmol/L — ABNORMAL LOW (ref 20–29)
Calcium: 9.9 mg/dL (ref 8.7–10.2)
Chloride: 106 mmol/L (ref 96–106)
Creatinine, Ser: 0.85 mg/dL (ref 0.57–1.00)
Globulin, Total: 2.7 g/dL (ref 1.5–4.5)
Glucose: 87 mg/dL (ref 70–99)
Potassium: 4 mmol/L (ref 3.5–5.2)
Sodium: 143 mmol/L (ref 134–144)
Total Protein: 7.4 g/dL (ref 6.0–8.5)
eGFR: 82 mL/min/{1.73_m2} (ref >59)

## 2022-01-10 LAB — IRON,TIBC AND FERRITIN PANEL
Ferritin: 166 ng/mL — ABNORMAL HIGH (ref 15–150)
Iron Saturation: 19 % (ref 15–55)
Iron: 54 ug/dL (ref 27–159)
Total Iron Binding Capacity: 284 ug/dL (ref 250–450)
UIBC: 230 ug/dL (ref 131–425)

## 2022-01-10 LAB — MICROALBUMIN / CREATININE URINE RATIO
Creatinine, Urine: 207.3 mg/dL
Microalb/Creat Ratio: 24 mg/g creat (ref 0–29)
Microalbumin, Urine: 49.8 ug/mL

## 2022-01-10 LAB — LIPID PANEL
Chol/HDL Ratio: 2.1 ratio (ref 0.0–4.4)
Cholesterol, Total: 121 mg/dL (ref 100–199)
HDL: 57 mg/dL (ref >39)
LDL Chol Calc (NIH): 45 mg/dL (ref 0–99)
Triglycerides: 102 mg/dL (ref 0–149)
VLDL Cholesterol Cal: 19 mg/dL (ref 5–40)

## 2022-01-10 LAB — VITAMIN D 25 HYDROXY (VIT D DEFICIENCY, FRACTURES): Vit D, 25-Hydroxy: 41.4 ng/mL (ref 30.0–100.0)

## 2022-01-10 NOTE — Progress Notes (Signed)
Per Vincente Liberty Castner's request.   Lab results collected 01-09-2022 have been faxed to Dr Vilinda Boehringer at [phone] on 01-10-2022 _0 :00.   Maryan Rued, PBT [facility]

## 2022-01-11 ENCOUNTER — Ambulatory Visit (INDEPENDENT_AMBULATORY_CARE_PROVIDER_SITE_OTHER): Payer: Medicare Other | Admitting: Licensed Clinical Social Worker

## 2022-01-11 ENCOUNTER — Ambulatory Visit: Payer: Self-pay | Admitting: Licensed Clinical Social Worker

## 2022-01-11 DIAGNOSIS — F4312 Post-traumatic stress disorder, chronic: Secondary | ICD-10-CM | POA: Diagnosis not present

## 2022-01-11 DIAGNOSIS — F411 Generalized anxiety disorder: Secondary | ICD-10-CM | POA: Diagnosis not present

## 2022-01-11 DIAGNOSIS — F410 Panic disorder [episodic paroxysmal anxiety] without agoraphobia: Secondary | ICD-10-CM

## 2022-01-11 DIAGNOSIS — F331 Major depressive disorder, recurrent, moderate: Secondary | ICD-10-CM | POA: Diagnosis not present

## 2022-01-11 NOTE — Patient Outreach (Signed)
SW removed from Care Team.  Deisy Ozbun, BSW, MSW, LCSW-A  Social Worker [facility] Care Management  [phone] 

## 2022-01-11 NOTE — Progress Notes (Signed)
Virtual Visit via Video Note  I connected with Lindsay [name] on 01/11/22 at  8:00 AM EST by a video enabled telemedicine application and verified that I am speaking with the correct person using two identifiers.  Location: Patient: home Provider: home office   I discussed the limitations of evaluation and management by telemedicine and the availability of in person appointments. The patient expressed understanding and agreed to proceed.  I discussed the assessment and treatment plan with the patient. The patient was provided an opportunity to ask questions and all were answered. The patient agreed with the plan and demonstrated an understanding of the instructions.   The patient was advised to call back or seek an in-person evaluation if the symptoms worsen or if the condition fails to improve as anticipated.  I provided 30 minutes of non-face-to-face time during this encounter.  THERAPIST PROGRESS NOTE  Session Time: 8:00 AM to 8:30 AM  Participation Level: Active  Behavioral Response: CasualAlertmood impacted by pain  Type of Therapy: Individual Therapy  Treatment Goals addressed: atient continue to utilize therapy for  supportive and strength-based interventions, provide treatment interventions in the context of patient continuing to seek help and cope with medical issues, anxiety, triggers, coping with mental health symptoms, utilize therapy as a way for patient to focus on working through current stressors that also helps to distract from pain  ProgressTowards Goals: Progressing-assess helpful for patient to have therapeutic interventions to help with mood  Interventions: Solution Focused, Strength-based, Supportive, and Other: coping  Summary: Lindsay [name] is a 51 y.o. female who presents with doctor gave her Toradol shot to help with knee pain, it helped for a little bit, but moved her leg a certain way and pain right back to where it was. Can't put foot on floor and hurts  when she lays down.  Therapist asked what she plans to do and patient said just lay in the bed and cry. Hurting worst than before didn't think it could hurt worse. Doctor got to see trembling and told her to keep an eye on it. That was 9 in th morning called later to talk to her again, says it the cold medicine. Told her not to take the med and if don't feel better to let her know. It is doing it right now.Patient says she will tolerate it until talk to Dr. Adele Schilder as he always asked about trembling therapist noted there definitely is some psych meds that have that as a side effect. The trembling was happening before the pain medicine. Patient wanted to talk to therapist about a guy grew up with lived in same neighborhood killed himself. Went to school at the same time. He had five kids no life insurance.  Therapist processed how patient felt with someone she knew growing up and committed suicide.  Therapist provided her perspective is that life can be hard but there can be joy as well helpful and we can be part of creating that joy.  Positive that patient also feels this way that there is joy in life.  Therapist pointed out even the small things can be joyful noting this morning seeing a robin outside with breath  takingly beautiful.  Patient has ongoing pain issues and therapist providing supportive interventions noting getting a shot sounded like a good idea and she will continue to pursue as it helped for a little bit.  Doctor is aware of the trembling as she saw it patient going to talk to psychiatrist to see if  it is a psych med.  Assessed patient's attitude shows her strengths resources and resiliency..  Assess helpful for patient to be distracted during session get her mind off of pain issues.  Talked about Christmas plans and encouraged patient to have family help prepare if she is doing well.  Focusing on some positive noted her aid is really helping her and therapist noted she seems like she is a kind  person patient needs the help but she is willing to give her help.  Therapist provided strength and support while patient talked about thoughts and feelings in session.  Suicidal/Homicidal: No  Plan: Return again in 2 weeks.  Diagnosis:Major depressive disorder, recurrent, moderate, generalized anxiety disorder, chronic PTSD, panic attacks  Collaboration of Care: Other none needed  Patient/Guardian was advised Release of Information must be obtained prior to any record release in order to collaborate their care with an outside provider. Patient/Guardian was advised if they have not already done so to contact the registration department to sign all necessary forms in order for [name] to release information regarding their care.   Consent: Patient/Guardian gives verbal consent for treatment and assignment of benefits for services provided during this visit. Patient/Guardian expressed understanding and agreed to proceed.   Lindsay Register, LCSW 01/11/2022

## 2022-01-12 LAB — TOXASSURE SELECT,+ANTIDEPR,UR

## 2022-01-17 ENCOUNTER — Other Ambulatory Visit: Payer: Self-pay

## 2022-01-17 ENCOUNTER — Encounter: Payer: Self-pay | Admitting: Student

## 2022-01-17 ENCOUNTER — Ambulatory Visit (INDEPENDENT_AMBULATORY_CARE_PROVIDER_SITE_OTHER): Payer: Medicare Other | Admitting: Student

## 2022-01-17 VITALS — BP 143/93 | HR 85 | Temp 99.1°F | Resp 28 | Ht 70.0 in | Wt 350.4 lb

## 2022-01-17 DIAGNOSIS — R0982 Postnasal drip: Secondary | ICD-10-CM

## 2022-01-17 DIAGNOSIS — M25562 Pain in left knee: Secondary | ICD-10-CM

## 2022-01-17 MED ORDER — FLUTICASONE PROPIONATE 50 MCG/ACT NA SUSP: 1.0000 | Freq: Every day | NASAL | 0 refills | Status: DC

## 2022-01-17 MED ORDER — KETOROLAC TROMETHAMINE 30 MG/ML IJ SOLN
30.0000 mg | Freq: Once | INTRAMUSCULAR | Status: AC
  Administered 2022-01-17: 30 mg via INTRAMUSCULAR

## 2022-01-17 MED ORDER — AZELASTINE HCL 0.1 % NA SOLN: 1.0000 | Freq: Two times a day (BID) | NASAL | 12 refills | Status: DC

## 2022-01-17 NOTE — Assessment & Plan Note (Signed)
Prescribed Flonase and azelastine.

## 2022-01-17 NOTE — Patient Instructions (Addendum)
Today we discussed knee pain and runny nose.  For your knee pain, you received a shot of Toradol. If this doesn't help, come back to the clinic after the new year for a steroid knee injection.  For your runny nose, try nasal fluticasone (Flonase) and azelastine.  Return in 1 month for knee pain.   Please call our clinic at [phone] Monday through Friday from 9 am to 4 pm if you have questions or concerns about your health. If after hours or on the weekend, call the main hospital number and ask for the Internal Medicine Resident On-Call. If you need medication refills, please notify your pharmacy one week in advance and they will send [name] a request.   Best, Nani Gasser, [place]

## 2022-01-17 NOTE — Progress Notes (Signed)
Subjective:  [name] is a 51 y.o. female.  Her chief concerns are left knee pain and postnasal drip.  Review of Systems  Constitutional:  Negative for chills, fever and weight loss.  Respiratory:  Positive for shortness of breath.   Cardiovascular:  Negative for chest pain.    Past Medical History:  Diagnosis Date   Acid reflux    Anemia    Iron Def   Anorexia    CHF (congestive heart failure) (HCC)    Chronic kidney disease    Nephrotic syndrome   Colon polyp 2009   Depression with anxiety    Edema leg    Fibromyalgia    Hemorrhoids    Hidradenitis suppurativa    Hypertension    IBS (irritable bowel syndrome)    Low back pain    Migraines    Morbidly obese (HCC)    Neuromuscular disorder (HCC)    fibromyalgia   Neuropathy    Panic attacks    Polyp of vocal cord or larynx    Tonsil pain     Current Outpatient Medications on File Prior to Visit  Medication Sig Dispense Refill   ACCU-CHEK GUIDE test strip USE ONE TEST STRIP TO CHECK BLOOD SUGARS ONCE A DAY AS NEEDED 100 strip 1   Accu-Chek Softclix Lancets lancets USE ONE LANCET TO CHECK BLOOD SUGAR ONE A DAY AS NEEDED 100 each 1   ADDYI 100 MG TABS TAKE 1 TABLET BY MOUTH EVERY DAY 30 tablet 5   albuterol (PROVENTIL) (2.5 MG/3ML) 0.083% nebulizer solution TAKE 3 ML (2.5 MG TOTAL) BY NEBULIZATION EVERY 4 HOURS AS NEEDED FOR WHEEZING OR SHORTNESS OF BREATH 150 mL 0   albuterol (VENTOLIN HFA) 108 (90 Base) MCG/ACT inhaler TAKE 2 PUFFS BY MOUTH EVERY 6 HOURS AS NEEDED FOR WHEEZE OR SHORTNESS OF BREATH 8.5 each 0   Ascorbic Acid (VITAMIN C PO) Take 1 tablet by mouth daily.     atorvastatin (LIPITOR) 40 MG tablet Take 1 tablet (40 mg total) by mouth daily. 90 tablet 3   benzonatate (TESSALON PERLES) 100 MG capsule Take 1 capsule (100 mg total) by mouth every 6 (six) hours as needed for cough. 30 capsule 1   Blood Glucose Monitoring Suppl (ACCU-CHEK GUIDE ME) w/Device KIT Dispense one device 1 kit 0    Brexpiprazole (REXULTI) 3 MG TABS Take 1 tablet (3 mg total) by mouth daily. 90 tablet 0   Capsaicin 0.025 % PTCH Apply 1 patch topically daily as needed. 10 patch 9   cetirizine (ZYRTEC) 10 MG tablet Take 1 tablet (10 mg total) by mouth daily. 90 tablet 1   clindamycin (CLINDAGEL) 1 % gel APPLY TO AFFECTED AREA TWICE A DAY 30 g 0   clorazepate (TRANXENE) 3.75 MG tablet Take 1 tablet (3.75 mg total) by mouth daily. 30 tablet 2   D3-1000 25 MCG (1000 UT) capsule TAKE 1 CAPSULE BY MOUTH EVERY DAY 90 capsule 1   diclofenac Sodium (VOLTAREN) 1 % GEL APPLY 2 GRAMS TOPICALLY 4 (FOUR) TIMES DAILY AS NEEDED FOR KNEE PAIN 400 g 1   ENTRESTO 49-51 MG Take 1 tablet by mouth 2 (two) times daily.     famotidine (PEPCID) 20 MG tablet TAKE 1 TABLET BY MOUTH EVERY DAY 90 tablet 1   ferrous sulfate 325 (65 FE) MG EC tablet Take 1 tablet (325 mg total) by mouth daily with breakfast. 90 tablet 1   ipratropium (ATROVENT) 0.06 % nasal spray PLEASE SEE ATTACHED FOR  DETAILED DIRECTIONS     lamoTRIgine (LAMICTAL) 150 MG tablet Take 1 tablet (150 mg total) by mouth daily. 90 tablet 0   lidocaine (LIDODERM) 5 % Place 2 patches onto the skin daily. Remove & Discard patch within 12 hours or as directed by MD 60 patch 2   lidocaine (XYLOCAINE) 5 % ointment Apply 1 application topically as needed for mild pain. 50 g 0   meclizine (ANTIVERT) 25 MG tablet TAKE 1 TABLET BY MOUTH THREE TIMES A DAY AS NEEDED FOR DIZZINESS 90 tablet 2   megestrol (MEGACE) 40 MG tablet Take 2 tablets (80 mg total) by mouth daily. 180 tablet 1   methocarbamol (ROBAXIN) 750 MG tablet Take 2 tablets (1,500 mg total) by mouth every 8 (eight) hours as needed for muscle spasms. 180 tablet 2   metoprolol succinate (TOPROL-XL) 50 MG 24 hr tablet Take 1 tablet (50 mg total) by mouth daily. 90 tablet 3   mirtazapine (REMERON) 15 MG tablet Take 1 tablet (15 mg total) by mouth at bedtime. 90 tablet 0   omeprazole (PRILOSEC) 40 MG capsule TAKE 1 CAPSULE (40 MG  TOTAL) BY MOUTH DAILY. 90 capsule 3   ondansetron (ZOFRAN-ODT) 8 MG disintegrating tablet TAKE 1 TABLET BY MOUTH EVERY 12 HOURS AS NEEDED 10 tablet 1   Semaglutide, 2 MG/DOSE, (OZEMPIC, 2 MG/DOSE,) 8 MG/3ML SOPN Inject 2 mg into the skin once a week. 3 mL 3   SYMBICORT 160-4.5 MCG/ACT inhaler INHALE 2 PUFFS INTO THE LUNGS IN THE MORNING AND AT BEDTIME. 10.2 each 0   torsemide (DEMADEX) 20 MG tablet TAKE 2 TABLETS BY MOUTH TWICE A DAY 360 tablet 1   traMADol (ULTRAM) 50 MG tablet Take 1 tablet (50 mg total) by mouth every 4 (four) hours as needed. 180 tablet 0   vitamin B-12 (CYANOCOBALAMIN) 1000 MCG tablet Take 1 tablet (1,000 mcg total) by mouth daily. 30 tablet 3   Vitamin E 400 units TABS Take 400 Units by mouth daily.      No current facility-administered medications on file prior to visit.    Past Surgical History:  Procedure Laterality Date   AXILLARY HIDRADENITIS EXCISION     COLONOSCOPY WITH PROPOFOL N/A 05/25/2015   Procedure: COLONOSCOPY WITH PROPOFOL;  Surgeon: Milus Banister, MD;  Location: [facility];  Service: Endoscopy;  Laterality: N/A;   COLONOSCOPY WITH PROPOFOL N/A 12/31/2018   Procedure: COLONOSCOPY WITH PROPOFOL;  Surgeon: Milus Banister, MD;  Location: [facility];  Service: Endoscopy;  Laterality: N/A;   HEMORRHOID SURGERY     with Hidradenitis surgery    INGUINAL HIDRADENITIS EXCISION     TONSILLECTOMY  10/18/2010   by Dr. Wilburn Cornelia   UPPER GASTROINTESTINAL ENDOSCOPY      Family History  Problem Relation Age of Onset   Diabetes Mother    Heart disease Mother        valve leak   Anxiety disorder Mother    Depression Mother    High blood pressure Mother    Kidney failure Mother    Cancer Father        prostate   Heart disease Father    Learning disabilities Sister    Depression Sister    Depression Sister    Anxiety disorder Sister    Colon cancer Neg Hx     Social History   Socioeconomic History   Marital status: Single    Spouse name: Not  on file   Number of children: 1   Years of  education: 11   Highest education level: Not on file  Occupational History   Occupation: Disabled  Tobacco Use   Smoking status: Former    Packs/day: 0.10    Years: 25.00    Total pack years: 2.50    Types: Cigarettes    Quit date: 05/24/2019    Years since quitting: 2.6   Smokeless tobacco: Never   Tobacco comments:    quit in April.  Vaping Use   Vaping Use: Never used  Substance and Sexual Activity   Alcohol use: Not Currently    Alcohol/week: 0.0 standard drinks of alcohol    Comment: none sine 05/2018   Drug use: Not Currently    Frequency: 3.0 times per week    Types: Marijuana   Sexual activity: Yes    Birth control/protection: Implant  Other Topics Concern   Not on file  Social History Narrative   Lives at home w/ her mother and daughter   Right-handed   Caffeine: about 3 Cokes per week   Social Determinants of Health   Financial Resource Strain: High Risk (08/14/2021)   Overall Financial Resource Strain (CARDIA)    Difficulty of Paying Living Expenses: Very hard  Food Insecurity: Food Insecurity Present (08/14/2021)   Hunger Vital Sign    Worried About Running Out of Food in the Last Year: Often true    Ran Out of Food in the Last Year: Often true  Transportation Needs: Unmet Transportation Needs (08/14/2021)   PRAPARE - Transportation    Lack of Transportation (Medical): Yes    Lack of Transportation (Non-Medical): Yes  Physical Activity: Insufficiently Active (08/14/2021)   Exercise Vital Sign    Days of Exercise per Week: 1 day    Minutes of Exercise per Session: 40 min  Stress: No Stress Concern Present (08/14/2021)   [place]    Feeling of Stress : Only a little  Social Connections: Moderately Isolated (08/14/2021)   Social Connection and Isolation Panel [NHANES]    Frequency of Communication with Friends and Family: Never    Frequency of  Social Gatherings with Friends and Family: More than three times a week    Attends Religious Services: 1 to 4 times per year    Active Member of [employer] or Organizations: No    Attends Archivist Meetings: Never    Marital Status: Never married  Intimate Partner Violence: Not At Risk (08/14/2021)   Humiliation, Afraid, Rape, and Kick questionnaire    Fear of Current or Ex-Partner: No    Emotionally Abused: No    Physically Abused: No    Sexually Abused: No    Objective:   Vitals:   01/17/22 1512 01/17/22 1519  BP: (!) 141/88 (!) 143/93  Pulse: 84 85  Resp: (!) 28   Temp: 99.1 F (37.3 C)   TempSrc: Oral   SpO2: 100%   Weight: (!) 350 lb 6.4 oz (158.9 kg)   Height: _0  (1.778 m)     Physical Exam Vitals reviewed.  Constitutional:      General: She is not in acute distress.    Appearance: Normal appearance.  HENT:     Mouth/Throat:     Mouth: Mucous membranes are moist.     Pharynx: No oropharyngeal exudate or posterior oropharyngeal erythema.  Eyes:     Conjunctiva/sclera: Conjunctivae normal.  Cardiovascular:     Rate and Rhythm: Normal rate and regular rhythm.     Pulses:  Normal pulses.  Pulmonary:     Effort: Pulmonary effort is normal.     Breath sounds: Normal breath sounds. No stridor.  Abdominal:     Palpations: Abdomen is soft.     Tenderness: There is no abdominal tenderness.  Musculoskeletal:     Right lower leg: No edema.     Left lower leg: No edema.     Comments: Tenderness along medial aspect of left patella and medial left knee joint line.  Lymphadenopathy:     Cervical: No cervical adenopathy.  Skin:    General: Skin is warm and dry.  Neurological:     Mental Status: She is alert. Mental status is at baseline.  Psychiatric:        Mood and Affect: Mood normal.        Behavior: Behavior normal.     Assessment & Plan:  The primary encounter diagnosis was Post-nasal drip. A diagnosis of Acute pain of left knee was also pertinent  to this visit.  Acute pain of left knee Continues to have acute on chronic left knee pain.  On exam her pain is most severe on the medial side of the patella and along the medial joint line.  No warmth, swelling, erythema, or palpable effusion.  Today, we will try another 30 mg Toradol injection.  If this continues to bother her over the next couple of weeks, we can do an intra-articular steroid injection.  Post-nasal drip Prescribed Flonase and azelastine.    Return in 1 month for knee pain.  Patient seen with Dr. Teofilo Pod MD 01/17/2022, 5:37 PM  Pager: [phone]

## 2022-01-17 NOTE — Assessment & Plan Note (Signed)
Continues to have acute on chronic left knee pain.  On exam her pain is most severe on the medial side of the patella and along the medial joint line.  No warmth, swelling, erythema, or palpable effusion.  Today, we will try another 30 mg Toradol injection.  If this continues to bother her over the next couple of weeks, we can do an intra-articular steroid injection.

## 2022-01-18 NOTE — Progress Notes (Signed)
[facility] Medicine Clinic Attending  I saw and evaluated the patient.  I personally confirmed the key portions of the history and exam documented by Dr. McLendon and I reviewed pertinent patient test results.  The assessment, diagnosis, and plan were formulated together and I agree with the documentation in the resident's note.  

## 2022-01-21 ENCOUNTER — Other Ambulatory Visit: Payer: Self-pay | Admitting: Internal Medicine

## 2022-01-21 DIAGNOSIS — M25561 Pain in right knee: Secondary | ICD-10-CM

## 2022-01-23 ENCOUNTER — Telehealth: Payer: Self-pay

## 2022-01-23 NOTE — Telephone Encounter (Signed)
Pt would like to know if her muscle relaxer medication to be changed to something different. Please call pt back.

## 2022-01-23 NOTE — Telephone Encounter (Signed)
Pt was called and informed of Dr Rivka Safer response. Call transferred to front office to schedule an appt -stated she only wants to see Dr Philipp Ovens. Appt schedule on 02/06/22 @ 0815 AM.

## 2022-01-23 NOTE — Telephone Encounter (Signed)
No, please tell patient we need to limit medication changes outside of office visits. Thank you.

## 2022-01-23 NOTE — Telephone Encounter (Signed)
Thank you :)

## 2022-01-24 ENCOUNTER — Ambulatory Visit (HOSPITAL_COMMUNITY)
Admission: RE | Admit: 2022-01-24 | Discharge: 2022-01-24 | Disposition: A | Discharge status: Home / Self Care | Payer: Medicare Other | Source: Ambulatory Visit | Attending: Internal Medicine | Admitting: Internal Medicine

## 2022-01-24 DIAGNOSIS — M79605 Pain in left leg: Secondary | ICD-10-CM | POA: Insufficient documentation

## 2022-01-24 DIAGNOSIS — M79604 Pain in right leg: Secondary | ICD-10-CM | POA: Insufficient documentation

## 2022-01-24 DIAGNOSIS — G8929 Other chronic pain: Secondary | ICD-10-CM | POA: Diagnosis not present

## 2022-01-24 NOTE — Progress Notes (Signed)
VASCULAR LAB    ABI has been performed.  See CV proc for preliminary results.   Darlean Warmoth, RVT 01/24/2022, 9:26 AM

## 2022-01-25 ENCOUNTER — Ambulatory Visit (INDEPENDENT_AMBULATORY_CARE_PROVIDER_SITE_OTHER): Payer: Medicare Other | Admitting: Licensed Clinical Social Worker

## 2022-01-25 DIAGNOSIS — F4312 Post-traumatic stress disorder, chronic: Secondary | ICD-10-CM | POA: Diagnosis not present

## 2022-01-25 DIAGNOSIS — F411 Generalized anxiety disorder: Secondary | ICD-10-CM | POA: Diagnosis not present

## 2022-01-25 DIAGNOSIS — F410 Panic disorder [episodic paroxysmal anxiety] without agoraphobia: Secondary | ICD-10-CM

## 2022-01-25 DIAGNOSIS — F331 Major depressive disorder, recurrent, moderate: Secondary | ICD-10-CM | POA: Diagnosis not present
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
## 2022-01-27 ENCOUNTER — Other Ambulatory Visit: Payer: Self-pay | Admitting: Internal Medicine

## 2022-01-27 DIAGNOSIS — E1121 Type 2 diabetes mellitus with diabetic nephropathy: Secondary | ICD-10-CM

## 2022-01-29 ENCOUNTER — Other Ambulatory Visit: Payer: Self-pay | Admitting: Internal Medicine

## 2022-01-29 DIAGNOSIS — L732 Hidradenitis suppurativa: Secondary | ICD-10-CM

## 2022-01-30 DIAGNOSIS — E119 Type 2 diabetes mellitus without complications: Secondary | ICD-10-CM | POA: Diagnosis not present

## 2022-01-30 DIAGNOSIS — I5042 Chronic combined systolic (congestive) and diastolic (congestive) heart failure: Secondary | ICD-10-CM | POA: Diagnosis not present

## 2022-01-30 DIAGNOSIS — I10 Essential (primary) hypertension: Secondary | ICD-10-CM | POA: Diagnosis not present

## 2022-01-30 DIAGNOSIS — R0789 Other chest pain: Secondary | ICD-10-CM | POA: Diagnosis not present

## 2022-02-01 ENCOUNTER — Ambulatory Visit (INDEPENDENT_AMBULATORY_CARE_PROVIDER_SITE_OTHER): Payer: Medicare Other | Admitting: Licensed Clinical Social Worker

## 2022-02-01 DIAGNOSIS — F411 Generalized anxiety disorder: Secondary | ICD-10-CM | POA: Diagnosis not present

## 2022-02-01 DIAGNOSIS — F4312 Post-traumatic stress disorder, chronic: Secondary | ICD-10-CM | POA: Diagnosis not present

## 2022-02-01 DIAGNOSIS — F331 Major depressive disorder, recurrent, moderate: Secondary | ICD-10-CM

## 2022-02-01 DIAGNOSIS — F410 Panic disorder [episodic paroxysmal anxiety] without agoraphobia: Secondary | ICD-10-CM

## 2022-02-01 NOTE — Progress Notes (Signed)
Virtual Visit via Video Note  I connected with Lindsay [name] on 02/01/22 at  8:00 AM EST by a video enabled telemedicine application and verified that I am speaking with the correct person using two identifiers.  Location: Patient: home Provider: home office   I discussed the limitations of evaluation and management by telemedicine and the availability of in person appointments. The patient expressed understanding and agreed to proceed.  I discussed the assessment and treatment plan with the patient. The patient was provided an opportunity to ask questions and all were answered. The patient agreed with the plan and demonstrated an understanding of the instructions.   The patient was advised to call back or seek an in-person evaluation if the symptoms worsen or if the condition fails to improve as anticipated.  I provided 18 minutes of non-face-to-face time during this encounter.  THERAPIST PROGRESS NOTE  Session Time: 8:10 AM to 8:28 AM  Participation Level: Active  Behavioral Response: CasualAlertappropriate  Type of Therapy: Individual Therapy  Treatment Goals addressed: patient continue to utilize therapy for  supportive and strength-based interventions, provide treatment interventions in the context of patient continuing to seek help and cope with medical issues, anxiety, triggers, coping with mental health symptoms, utilize therapy as a way for patient to focus on working through current stressors that also helps to distract from pain  ProgressTowards Goals: Progressing-engaged with therapy to help her continue to work on issues  Interventions: Solution Focused, Strength-based, Supportive, and Other: coping  Summary: Lindsay [name] is a 52 y.o. female who presents with late for session phone messed up. Can't talk about anything phone on speaker whole house hear.  Session Short patient does have a session next week seeing her phone and if necessary step out of the house to get  privacy.  Therapist provided space and support for patient to talk about thoughts and feelings in session  Suicidal/Homicidal: No  Plan: Return again in 1 weeks.2.Therapist provided supportive interventions, as well as encouragement for patient to keep pressuring medical providers to address pain issues, additionally using sessions to process thoughts and feelings  Diagnosis: Major depressive disorder, recurrent, moderate, generalized anxiety disorder, chronic PTSD, panic attacks  Collaboration of Care: Other none needed  Patient/Guardian was advised Release of Information must be obtained prior to any record release in order to collaborate their care with an outside provider. Patient/Guardian was advised if they have not already done so to contact the registration department to sign all necessary forms in order for [name] to release information regarding their care.   Consent: Patient/Guardian gives verbal consent for treatment and assignment of benefits for services provided during this visit. Patient/Guardian expressed understanding and agreed to proceed.   Lindsay Register, LCSW 02/01/2022

## 2022-02-01 NOTE — Addendum Note (Signed)
Addended by: [name] A on: 02/01/2022 08:31 AM   Modules accepted: Level of Service

## 2022-02-01 NOTE — Progress Notes (Addendum)
m °

## 2022-02-02 ENCOUNTER — Other Ambulatory Visit: Payer: Self-pay | Admitting: Internal Medicine

## 2022-02-02 DIAGNOSIS — E119 Type 2 diabetes mellitus without complications: Secondary | ICD-10-CM

## 2022-02-04 ENCOUNTER — Other Ambulatory Visit: Payer: Self-pay | Admitting: Internal Medicine

## 2022-02-04 DIAGNOSIS — E1121 Type 2 diabetes mellitus with diabetic nephropathy: Secondary | ICD-10-CM

## 2022-02-05 DIAGNOSIS — J9620 Acute and chronic respiratory failure, unspecified whether with hypoxia or hypercapnia: Secondary | ICD-10-CM | POA: Diagnosis not present

## 2022-02-06 ENCOUNTER — Encounter: Payer: Medicare Other | Admitting: Internal Medicine

## 2022-02-08 ENCOUNTER — Ambulatory Visit (INDEPENDENT_AMBULATORY_CARE_PROVIDER_SITE_OTHER): Payer: 59 | Admitting: Licensed Clinical Social Worker

## 2022-02-08 DIAGNOSIS — F410 Panic disorder [episodic paroxysmal anxiety] without agoraphobia: Secondary | ICD-10-CM

## 2022-02-08 DIAGNOSIS — F331 Major depressive disorder, recurrent, moderate: Secondary | ICD-10-CM

## 2022-02-08 DIAGNOSIS — F4312 Post-traumatic stress disorder, chronic: Secondary | ICD-10-CM | POA: Diagnosis not present

## 2022-02-08 DIAGNOSIS — F411 Generalized anxiety disorder: Secondary | ICD-10-CM | POA: Diagnosis not present
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
## 2022-02-13 ENCOUNTER — Other Ambulatory Visit: Payer: Self-pay | Admitting: *Deleted

## 2022-02-13 DIAGNOSIS — A084 Viral intestinal infection, unspecified: Secondary | ICD-10-CM

## 2022-02-13 NOTE — Telephone Encounter (Signed)
Call from patient states she ate a  spoonfull of Chicken Salad from [employer] on Friday night.  4 crackers on Saturday.  On Friday she stated to feel full.  And had pain.  She had a solid white stool with abdominal pain. The other 1 stool she had was light colored  Has had some Nausea since with ramping and burning in her abdomen.  Took a temperature it was 99. Has had an agonizing burning pain on her abdomen since Friday.  2 other people ate the Chicken Salad.

## 2022-02-14 ENCOUNTER — Other Ambulatory Visit: Payer: Self-pay | Admitting: *Deleted

## 2022-02-14 MED ORDER — ONDANSETRON 8 MG PO TBDP: 8.0000 mg | ORAL_TABLET | Freq: Two times a day (BID) | ORAL | 1 refills | Status: DC | PRN

## 2022-02-14 NOTE — Telephone Encounter (Signed)
Does she want an appointment?

## 2022-02-14 NOTE — Telephone Encounter (Signed)
Pt called / informed of Ondansetron rx.

## 2022-02-14 NOTE — Telephone Encounter (Signed)
Called pt who stated she continues to have an agonizing ,ongoing abd pain; along with nausea. She stated she has not taken anything for the pain and does not a refill on nausea med (Zofran). Informed pt we have no available appts today nor tomorrow. Advised pt to go to UC but she stated she will need transportation - UC hours given to pt - stated she will call transportation to see if they can take her to [facility].  I called [facility] who stated there's no more refills on Ondansetron.

## 2022-02-22 ENCOUNTER — Ambulatory Visit (INDEPENDENT_AMBULATORY_CARE_PROVIDER_SITE_OTHER): Payer: 59 | Admitting: Licensed Clinical Social Worker

## 2022-02-22 DIAGNOSIS — F4312 Post-traumatic stress disorder, chronic: Secondary | ICD-10-CM

## 2022-02-22 DIAGNOSIS — F410 Panic disorder [episodic paroxysmal anxiety] without agoraphobia: Secondary | ICD-10-CM

## 2022-02-22 DIAGNOSIS — F331 Major depressive disorder, recurrent, moderate: Secondary | ICD-10-CM | POA: Diagnosis not present

## 2022-02-22 DIAGNOSIS — F411 Generalized anxiety disorder: Secondary | ICD-10-CM

## 2022-02-22 NOTE — Progress Notes (Signed)
Virtual Visit via Video Note  I connected with Lindsay [name] on 02/22/22 at  8:00 AM EST by a video enabled telemedicine application and verified that I am speaking with the correct person using two identifiers.  Location: Patient: home Provider: home office   I discussed the limitations of evaluation and management by telemedicine and the availability of in person appointments. The patient expressed understanding and agreed to proceed.   I discussed the assessment and treatment plan with the patient. The patient was provided an opportunity to ask questions and all were answered. The patient agreed with the plan and demonstrated an understanding of the instructions.   The patient was advised to call back or seek an in-person evaluation if the symptoms worsen or if the condition fails to improve as anticipated.  I provided 40 minutes of non-face-to-face time during this encounter.   THERAPIST PROGRESS NOTE  Session Time: 8:00 AM to 8:40 AM  Participation Level: Active  Behavioral Response: CasualAlertDysphoric-not feeling well  Type of Therapy: Individual Therapy  Treatment Goals addressed: atient continue to utilize therapy for  supportive and strength-based interventions, provide treatment interventions in the context of patient continuing to seek help and cope with medical issues, anxiety, triggers, coping with mental health symptoms, utilize therapy as a way for patient to focus on working through current stressors that also helps to distract from pain  ProgressTowards Goals: Progressing-continue to provide supportive strength-based interventions, interventions and therapy that help cope with medical issues, processing thoughts and feelings cope with not only medical issues but other stressors  Interventions: Solution Focused, Strength-based, Supportive, and Other: coping  Summary: Lindsay [name] is a 52 y.o. female who presents with therapist review of last session that she was  cooking pot pies a sign she was doing better and therapist assessment.  Patient says not necessarily feel better something have to do. Over pushing things.  Talked about strategies she has come up that helped her move around. Therapist pointed out losing weight benefits heart, her health and patient sees that. Patient goes on to explain  stomach feels like eating all the time even though not. Haven't had Ozempic in two weeks, feel sick, even though haven't been eating, stomach tight have to go to doctor, did get stomach pills helps with nauseous, feels sick in  stomach like smells like garlic. Color of stool is white, doesn't know if something GI doesn't know what it is.  Doesn't want to be in plain like now knows not torn meniscus not why in pain would be healed by now. Doesn't know if more damaged by walking around on it and nobody doing anything about it. Told her about it last December. The left knee really bothers her haven't had sleep because of leg hurting for two nights. Things could be more like Lupus something skimmed over the top and not looked into it. Not about pain pills but what is going on so can treat. Tickle in throat irritant like wants to itch it. Daughter doesn't come around. Therapist asked if that was a relief patient says disturbing not used to not having her around relief but worrying about her. Might be there 2 days out of 7.  Still talking to guy from [place]. Can hear him sleeping on the phone that is awful but in general it's ok talking to him. On Facebook dating app contact her and patient says communicates for  entertainment purposes only. Knows there are plenty of scammers on the app.  Therapist focused session  on therapeutic interventions for pain as patient which she knew about mindfulness she says not much.  Therapist began to explain (see below) patient will do research and continue to talk about it next session not only mindfulness but strategies for pain related to  therapeutic interventions.   Patient continues to have different physical issues so session includes processing thoughts and feelings to help with coping, providing supportive interventions, also validating noting still looking for underlying explanation of pain issues as there seems more that needs explained by patient's presentation.  Therapeutic outlet is helpful for patient expressing both coping with distress of medical issues as well as frustrations of not having the symptoms decrease.  Again patient is sick today with different issue feeling sick to her stomach encouraged her with addressing this.  Introduced therapeutic interventions for pain mindfulness used.  Explained for therapy mindfulness is Taking an distracted time to become fully aware of your thoughts and feelings so that you can have more choices and how you respond to them.  Do I really agree with this thought or have I been pressured and to believing it?  How do I want to respond to this feeling distract from it repress it expressed that or just feel it until it changes into something else?  Providing more explanation describing it more one of the core features is observing, observing your experience in a manner that is more direct and central sensing mode rather than being analytical thinking mode.  A natural tendency of the mind is to try and think about something rather than directly experience that.  Mindfulness thoughts aims to shift once focus of attention away from thinking to simply observing thoughts and feelings and body sensations for example touch site sounds smell taste with a kind and gentle curiosity.  This observing mode of mindfulness can be used as a way to distract, focus on things outside oneself not on the pain.  We stopped explanation there patient will do more research and talk about it next time patient not feeling well so we cut off session we will continue with using different strategies for pain  management. Suicidal/Homicidal: No  Plan: Return again in 2 weeks.2.  Talked more about mindfulness, look at other therapeutic strategies that would help with pain  Diagnosis: Major depressive disorder, recurrent, moderate, generalized anxiety disorder, chronic PTSD, panic attacks  Collaboration of Care: Other none needed  Patient/Guardian was advised Release of Information must be obtained prior to any record release in order to collaborate their care with an outside provider. Patient/Guardian was advised if they have not already done so to contact the registration department to sign all necessary forms in order for [name] to release information regarding their care.   Consent: Patient/Guardian gives verbal consent for treatment and assignment of benefits for services provided during this visit. Patient/Guardian expressed understanding and agreed to proceed.   [name], LCSW 02/22/2022

## 2022-02-24 ENCOUNTER — Other Ambulatory Visit (HOSPITAL_COMMUNITY): Payer: Self-pay | Admitting: Psychiatry

## 2022-02-24 ENCOUNTER — Telehealth: Payer: Self-pay | Admitting: Student

## 2022-02-24 DIAGNOSIS — M797 Fibromyalgia: Secondary | ICD-10-CM

## 2022-02-24 DIAGNOSIS — F331 Major depressive disorder, recurrent, moderate: Secondary | ICD-10-CM

## 2022-02-24 MED ORDER — TIZANIDINE HCL 4 MG PO TABS: 4.0000 mg | ORAL_TABLET | Freq: Three times a day (TID) | ORAL | 0 refills | Status: AC | PRN

## 2022-02-24 NOTE — Telephone Encounter (Signed)
Patient calling in requesting tizanidine. She states the robaxin is not helping her pain, especially in the back of her legs. Discussed plan to fill short course today and follow-up in clinic in the next week. She states she will not take the Robaxin and tizanidine together. Patient verbalized understanding.  Sanjuan Dame, MD Internal Medicine PGY-3 Pager: [phone]

## 2022-02-25 ENCOUNTER — Other Ambulatory Visit: Payer: Self-pay | Admitting: Internal Medicine

## 2022-02-25 DIAGNOSIS — E559 Vitamin D deficiency, unspecified: Secondary | ICD-10-CM

## 2022-02-27 ENCOUNTER — Other Ambulatory Visit: Payer: Self-pay | Admitting: Internal Medicine

## 2022-02-27 DIAGNOSIS — R42 Dizziness and giddiness: Secondary | ICD-10-CM

## 2022-02-28 ENCOUNTER — Other Ambulatory Visit: Payer: Self-pay | Admitting: Internal Medicine

## 2022-02-28 DIAGNOSIS — L732 Hidradenitis suppurativa: Secondary | ICD-10-CM

## 2022-03-03 ENCOUNTER — Other Ambulatory Visit (HOSPITAL_COMMUNITY): Payer: Self-pay | Admitting: Psychiatry

## 2022-03-03 DIAGNOSIS — F331 Major depressive disorder, recurrent, moderate: Secondary | ICD-10-CM

## 2022-03-04 ENCOUNTER — Other Ambulatory Visit: Payer: Self-pay | Admitting: Student

## 2022-03-04 DIAGNOSIS — M797 Fibromyalgia: Secondary | ICD-10-CM

## 2022-03-05 NOTE — Telephone Encounter (Signed)
Please tell patient we cannot continue to switch back and forth between muscle relaxers. And we need to limit changes to her medications out side of office visits. Refill for tizanidine declined.

## 2022-03-06 ENCOUNTER — Other Ambulatory Visit: Payer: Self-pay | Admitting: Internal Medicine

## 2022-03-06 DIAGNOSIS — J9611 Chronic respiratory failure with hypoxia: Secondary | ICD-10-CM | POA: Diagnosis not present

## 2022-03-06 DIAGNOSIS — J449 Chronic obstructive pulmonary disease, unspecified: Secondary | ICD-10-CM | POA: Diagnosis not present

## 2022-03-07 ENCOUNTER — Telehealth (HOSPITAL_BASED_OUTPATIENT_CLINIC_OR_DEPARTMENT_OTHER): Payer: 59 | Admitting: Psychiatry

## 2022-03-07 ENCOUNTER — Encounter (HOSPITAL_COMMUNITY): Payer: Self-pay | Admitting: Psychiatry

## 2022-03-07 DIAGNOSIS — F331 Major depressive disorder, recurrent, moderate: Secondary | ICD-10-CM

## 2022-03-07 DIAGNOSIS — F410 Panic disorder [episodic paroxysmal anxiety] without agoraphobia: Secondary | ICD-10-CM | POA: Diagnosis not present

## 2022-03-07 DIAGNOSIS — F4312 Post-traumatic stress disorder, chronic: Secondary | ICD-10-CM

## 2022-03-07 MED ORDER — REXULTI 3 MG PO TABS: 3.0000 mg | ORAL_TABLET | Freq: Every day | ORAL | 0 refills | Status: DC

## 2022-03-07 MED ORDER — CLORAZEPATE DIPOTASSIUM 3.75 MG PO TABS: 3.7500 mg | ORAL_TABLET | Freq: Every day | ORAL | 2 refills | Status: DC

## 2022-03-07 MED ORDER — LAMOTRIGINE 150 MG PO TABS: 150.0000 mg | ORAL_TABLET | Freq: Every day | ORAL | 0 refills | Status: DC

## 2022-03-07 MED ORDER — MIRTAZAPINE 15 MG PO TABS: 15.0000 mg | ORAL_TABLET | Freq: Every day | ORAL | 0 refills | Status: DC
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
## 2022-03-08 ENCOUNTER — Ambulatory Visit (INDEPENDENT_AMBULATORY_CARE_PROVIDER_SITE_OTHER): Payer: 59 | Admitting: Licensed Clinical Social Worker

## 2022-03-08 DIAGNOSIS — F331 Major depressive disorder, recurrent, moderate: Secondary | ICD-10-CM

## 2022-03-08 DIAGNOSIS — F4312 Post-traumatic stress disorder, chronic: Secondary | ICD-10-CM

## 2022-03-08 DIAGNOSIS — F410 Panic disorder [episodic paroxysmal anxiety] without agoraphobia: Secondary | ICD-10-CM

## 2022-03-08 DIAGNOSIS — J9620 Acute and chronic respiratory failure, unspecified whether with hypoxia or hypercapnia: Secondary | ICD-10-CM | POA: Diagnosis not present

## 2022-03-08 DIAGNOSIS — F411 Generalized anxiety disorder: Secondary | ICD-10-CM

## 2022-03-08 NOTE — Progress Notes (Signed)
Virtual Visit via Video Note  I connected with [name] on 03/08/22 at  8:00 AM EST by a video enabled telemedicine application and verified that I am speaking with the correct person using two identifiers.  Location: Patient: home Provider: home office   I discussed the limitations of evaluation and management by telemedicine and the availability of in person appointments. The patient expressed understanding and agreed to proceed.   I discussed the assessment and treatment plan with the patient. The patient was provided an opportunity to ask questions and all were answered. The patient agreed with the plan and demonstrated an understanding of the instructions.   The patient was advised to call back or seek an in-person evaluation if the symptoms worsen or if the condition fails to improve as anticipated.  I provided 30 minutes of non-face-to-face time during this encounter.  THERAPIST PROGRESS NOTE  Session Time: 8:00 AM to 8:30 AM  Participation Level: Active  Behavioral Response: CasualAlertappropriate  Type of Therapy: Individual Therapy  Treatment Goals addressed:  patient continue to utilize therapy for  supportive and strength-based interventions, provide treatment interventions in the context of patient continuing to seek help and cope with medical issues, anxiety, triggers, coping with mental health symptoms, utilize therapy as a way for patient to focus on working through current stressors that also helps to distract from pain  ProgressTowards Goals: Progressing-patient identifies therapy is very helpful coping mechanism for her utilize session to focus on other positive coping companionship spirituality also using therapy to process thoughts and feelings  Interventions: Solution Focused, Strength-based, Supportive, and Other: coping  Summary: Lindsay [name] is a 52 y.o. female who presents with sharing needed to turn off gospel music. Patient says listens to gospel  music a lot sets the pace for the day don't want to wake up with negative mind even if in pain. One day at time with the whole religious thing does the Bible study online can get there in person. It is so peaceful when listening. Goes to sleep even in church and feels that it is peaceful and serene. Don't pray as much as should it is like talking to someone should do more.  Therapist feedback was she felt the same needed to do more.  Therapist brought up doctors note and felt positive about it. Patient said was smiling even though in a lot of pain good mood. Has back pain and knee pain. Doctor said symptoms are manageable last time not in a good head space.  Patient says also needs to talk to therapist get stuff out between God and therapist will be fine. If don't talk to therapist will be in a negative state of mind and depressed. Have to have a therapist.  It has been ok her friend coming over [name] seems interested in her. Known him 2-3 years he comes to visit listen to music talk a lot or watch TV. Patient guesses interested don't want to go too far like [place]. With [name] he was talking about marriage and then switched up on her. Don't want to get too far gone and glad didn't do that with [name]. [name] is  somebody to talk to and joke keep her mind off of pain goes to the store if need to. He comes over every day or every other day. To good to be true so going slow with that.   Patient did look at things about  mindfulness that it is like a meditation.  Therapist provided additional information.  Reviewed helpfulness of therapy patient says helps a lot feels more peaceful calm after session instead of aggravation negative mood escalating.  Therapist reviewed doctors note noted some positive signs the patient comes are stable and manageable therapist was positive about this report.  Patient agrees but she also says needs therapy and got is well to help her manage symptoms.  Other topics were brought up  patient has a guy who visits her often therapist noted nice to have companionship and patient noted helps with mood and distraction from pain.  Talked about spirituality and both therapist and patient noting helpfulness and how more investment would be good to.  Although patient describes ways she already puts time into it listening to gospel music which sets mood for the day involved in a Bible study as well.  Therapist impressed with this engagement.  Therapist introduced mindfulness to patient noting it helps with pain but first patient getting a basic concept of it ways that it helps with mental health therapist noted to state of mind and through practices how you develop that state of mind.  Reviewed some practices such as meditative breathing, noticing senses mindfulness walk.  Therapist will continue to explain relation with mental health as well as looking at the value for pain.  Therapist provided space and support for patient to talk about thoughts and feelings in session. Suicidal/Homicidal: No  Plan: Return again in 2 weeks.2.  Both patient and therapist look at information on mindfulness and mood, also therapist help patient see connection to mental health. 3 Patient utilize sessions to process thoughts and feelings  Diagnosis: Major depressive disorder, recurrent, moderate, generalized anxiety disorder, chronic PTSD, panic attacks  Collaboration of Care: Medication Management AEB review of Dr. Adele Schilder last note  Patient/Guardian was advised Release of Information must be obtained prior to any record release in order to collaborate their care with an outside provider. Patient/Guardian was advised if they have not already done so to contact the registration department to sign all necessary forms in order for [name] to release information regarding their care.   Consent: Patient/Guardian gives verbal consent for treatment and assignment of benefits for services provided during this visit.  Patient/Guardian expressed understanding and agreed to proceed.   [name], LCSW 03/08/2022

## 2022-03-09 ENCOUNTER — Other Ambulatory Visit: Payer: Self-pay | Admitting: Student

## 2022-03-09 DIAGNOSIS — A084 Viral intestinal infection, unspecified: Secondary | ICD-10-CM

## 2022-03-09 MED ORDER — ONDANSETRON 8 MG PO TBDP: 8.0000 mg | ORAL_TABLET | Freq: Three times a day (TID) | ORAL | 1 refills | Status: DC | PRN

## 2022-03-09 NOTE — Progress Notes (Signed)
Patient called in to request refills of her Zofran. States she is out and has been having more nausea mostly at night due to her postnasal drip. I advised her to continue using her nasal rinse, Flonase and azelastine. I sent refill of her Zofran to her local pharmacy. I informed her to follow up in clinic as needed.

## 2022-03-13 ENCOUNTER — Ambulatory Visit (INDEPENDENT_AMBULATORY_CARE_PROVIDER_SITE_OTHER): Payer: 59 | Admitting: Student

## 2022-03-13 ENCOUNTER — Ambulatory Visit (HOSPITAL_COMMUNITY): Payer: 59

## 2022-03-13 VITALS — BP 128/81 | HR 87 | Temp 98.8°F | Wt 351.1 lb

## 2022-03-13 DIAGNOSIS — G629 Polyneuropathy, unspecified: Secondary | ICD-10-CM

## 2022-03-13 DIAGNOSIS — R1032 Left lower quadrant pain: Secondary | ICD-10-CM | POA: Diagnosis not present

## 2022-03-13 DIAGNOSIS — M797 Fibromyalgia: Secondary | ICD-10-CM

## 2022-03-13 DIAGNOSIS — R109 Unspecified abdominal pain: Secondary | ICD-10-CM

## 2022-03-13 LAB — POCT URINALYSIS DIPSTICK
Bilirubin, UA: NEGATIVE
Blood, UA: NEGATIVE
Glucose, UA: NEGATIVE
Ketones, UA: NEGATIVE
Leukocytes, UA: NEGATIVE
Nitrite, UA: NEGATIVE
Protein, UA: NEGATIVE
Spec Grav, UA: 1.02 (ref 1.010–1.025)
Urobilinogen, UA: 0.2 E.U./dL
pH, UA: 6 (ref 5.0–8.0)

## 2022-03-13 MED ORDER — LIDOCAINE 5 % EX PTCH: 2.0000 | MEDICATED_PATCH | CUTANEOUS | 0 refills | Status: DC

## 2022-03-13 MED ORDER — TIZANIDINE HCL 4 MG PO TABS: 4.0000 mg | ORAL_TABLET | Freq: Three times a day (TID) | ORAL | 0 refills | Status: DC

## 2022-03-13 MED ORDER — KETOROLAC TROMETHAMINE 30 MG/ML IJ SOLN
30.0000 mg | Freq: Once | INTRAMUSCULAR | Status: AC
  Administered 2022-03-13: 30 mg via INTRAMUSCULAR

## 2022-03-13 NOTE — Patient Instructions (Addendum)
Thank you so much for coming to the clinic today!   I'm sorry you're in so much pain. We are going to give you a toradol shot while you're here, and will try some lidocaine patches as well. I have sent in some tizanidine to help with the pain. We're also going to get a scan and help [name] look for any stones. If the scan is negative, I'd like to see you back here in two weeks. If you do start developing symptoms of weakness, or the pain starts getting worse. Please come back to the clinic or present to the ED.     If you have any questions please feel free to the call the clinic at anytime at [phone]. It was a pleasure seeing you!  Best, Dr. Sanjuana Mae

## 2022-03-14 ENCOUNTER — Other Ambulatory Visit: Payer: Self-pay | Admitting: *Deleted

## 2022-03-14 DIAGNOSIS — M797 Fibromyalgia: Secondary | ICD-10-CM

## 2022-03-14 NOTE — Telephone Encounter (Signed)
RTC to patient informed her that Dr Nooruddin will give her a call with the results of her test.  Will then discuss options for pain once the results of her MRI are read..  Patient wanted doctor to be aware that the test will not be done until late tomorrow afternoon..  Wants a call to discuss options for medication before CT is done on tomorrow.

## 2022-03-15 ENCOUNTER — Ambulatory Visit (HOSPITAL_BASED_OUTPATIENT_CLINIC_OR_DEPARTMENT_OTHER)
Admission: RE | Admit: 2022-03-15 | Discharge: 2022-03-15 | Disposition: A | Discharge status: Home / Self Care | Payer: 59 | Source: Ambulatory Visit | Attending: Internal Medicine | Admitting: Internal Medicine

## 2022-03-15 ENCOUNTER — Telehealth: Payer: Self-pay | Admitting: Internal Medicine

## 2022-03-15 DIAGNOSIS — R109 Unspecified abdominal pain: Secondary | ICD-10-CM | POA: Insufficient documentation

## 2022-03-15 DIAGNOSIS — R1032 Left lower quadrant pain: Secondary | ICD-10-CM | POA: Diagnosis not present

## 2022-03-15 MED ORDER — CYCLOBENZAPRINE HCL 10 MG PO TABS: 10.0000 mg | ORAL_TABLET | Freq: Three times a day (TID) | ORAL | 0 refills | Status: DC | PRN

## 2022-03-15 MED ORDER — TIZANIDINE HCL 4 MG PO TABS: 4.0000 mg | ORAL_TABLET | Freq: Three times a day (TID) | ORAL | 0 refills | Status: DC | PRN

## 2022-03-15 NOTE — Telephone Encounter (Signed)
Patient called after hours pager requesting her CT scan results from today. The patient was informed that her CT scan was normal, specifically noting that she does not have any kidney stones. U/A was also normal in the clinic. The patient has had severe left flank pain, and notes that it has gotten worse since she was in the clinic two days ago. She has been taking tylenol, gabapentin, lyrica, cymbalta, amitriptyline, and tizanidine without any relief. She has also tried robaxin in the past, without any success. She is unable to take NSAIDs due to her heart failure.   The patient states that she does not want to go to the emergency department and wishes to wait until Monday to call the clinic to be seen for another appt. In the meantime, I have sent in an rx for flexeril to see if the patient has some pain relief with this muscle relaxer, and advised the patient to stop taking tizanidine.   Buddy Duty, DO

## 2022-03-15 NOTE — Progress Notes (Signed)
CC: Left-sided flank pain  HPI:  [name] is a 52 y.o. female living with a history stated below and presents today for left-sided flank pain. Please see problem based assessment and plan for additional details.  Past Medical History:  Diagnosis Date   Acid reflux    Anemia    Iron Def   Anorexia    CHF (congestive heart failure) (HCC)    Chronic kidney disease    Nephrotic syndrome   Colon polyp 2009   Depression with anxiety    Edema leg    Fibromyalgia    Hemorrhoids    Hidradenitis suppurativa    Hypertension    IBS (irritable bowel syndrome)    Low back pain    Migraines    Morbidly obese (HCC)    Neuromuscular disorder (HCC)    fibromyalgia   Neuropathy    Panic attacks    Polyp of vocal cord or larynx    Tonsil pain     Current Outpatient Medications on File Prior to Visit  Medication Sig Dispense Refill   ACCU-CHEK GUIDE test strip USE ONE TEST STRIP TO CHECK BLOOD SUGARS ONCE A DAY AS NEEDED 100 strip 1   Accu-Chek Softclix Lancets lancets USE ONE LANCET TO CHECK BLOOD SUGAR ONE A DAY AS NEEDED 100 each 1   ADDYI 100 MG TABS TAKE 1 TABLET BY MOUTH EVERY DAY 30 tablet 5   albuterol (PROVENTIL) (2.5 MG/3ML) 0.083% nebulizer solution TAKE 3 ML (2.5 MG TOTAL) BY NEBULIZATION EVERY 4 HOURS AS NEEDED FOR WHEEZING OR SHORTNESS OF BREATH 150 mL 0   albuterol (VENTOLIN HFA) 108 (90 Base) MCG/ACT inhaler TAKE 2 PUFFS BY MOUTH EVERY 6 HOURS AS NEEDED FOR WHEEZE OR SHORTNESS OF BREATH 8.5 each 0   Ascorbic Acid (VITAMIN C PO) Take 1 tablet by mouth daily.     atorvastatin (LIPITOR) 40 MG tablet Take 1 tablet (40 mg total) by mouth daily. 90 tablet 3   azelastine (ASTELIN) 0.1 % nasal spray Place 1 spray into both nostrils 2 (two) times daily. Use in each nostril as directed 30 mL 12   benzonatate (TESSALON PERLES) 100 MG capsule Take 1 capsule (100 mg total) by mouth every 6 (six) hours as needed for cough. 30 capsule 1   Blood Glucose Monitoring Suppl (ACCU-CHEK  GUIDE ME) w/Device KIT Dispense one device 1 kit 0   Brexpiprazole (REXULTI) 3 MG TABS Take 1 tablet (3 mg total) by mouth daily. 90 tablet 0   Capsaicin 0.025 % PTCH Apply 1 patch topically daily as needed. 10 patch 9   cetirizine (ZYRTEC) 10 MG tablet Take 1 tablet (10 mg total) by mouth daily. 90 tablet 1   clindamycin (CLINDAGEL) 1 % gel APPLY TO AFFECTED AREA TWICE A DAY 30 g 0   clorazepate (TRANXENE) 3.75 MG tablet Take 1 tablet (3.75 mg total) by mouth daily. 30 tablet 2   [facility] D3 25 MCG (1000 UT) capsule TAKE 1 CAPSULE BY MOUTH EVERY DAY 90 capsule 1   diclofenac Sodium (VOLTAREN) 1 % GEL APPLY 2 GRAMS TOPICALLY 4 (FOUR) TIMES DAILY AS NEEDED FOR KNEE PAIN 400 g 1   ENTRESTO 49-51 MG Take 1 tablet by mouth 2 (two) times daily.     famotidine (PEPCID) 20 MG tablet TAKE 1 TABLET BY MOUTH EVERY DAY 90 tablet 1   ferrous sulfate 325 (65 FE) MG EC tablet Take 1 tablet (325 mg total) by mouth daily with breakfast. 90 tablet 1  fluticasone (FLONASE) 50 MCG/ACT nasal spray Place 1 spray into both nostrils daily. 9.9 mL 0   ipratropium (ATROVENT) 0.06 % nasal spray PLEASE SEE ATTACHED FOR DETAILED DIRECTIONS     lamoTRIgine (LAMICTAL) 150 MG tablet Take 1 tablet (150 mg total) by mouth daily. 90 tablet 0   lidocaine (XYLOCAINE) 5 % ointment Apply 1 application topically as needed for mild pain. 50 g 0   meclizine (ANTIVERT) 25 MG tablet TAKE 1 TABLET BY MOUTH THREE TIMES A DAY AS NEEDED FOR DIZZINESS 90 tablet 2   megestrol (MEGACE) 40 MG tablet Take 2 tablets (80 mg total) by mouth daily. 180 tablet 1   methocarbamol (ROBAXIN) 750 MG tablet Take 2 tablets (1,500 mg total) by mouth every 8 (eight) hours as needed for muscle spasms. 180 tablet 2   metoprolol succinate (TOPROL-XL) 50 MG 24 hr tablet Take 1 tablet (50 mg total) by mouth daily. 90 tablet 3   mirtazapine (REMERON) 15 MG tablet Take 1 tablet (15 mg total) by mouth at bedtime. 90 tablet 0   omeprazole (PRILOSEC) 40 MG capsule TAKE 1  CAPSULE (40 MG TOTAL) BY MOUTH DAILY. 90 capsule 3   ondansetron (ZOFRAN-ODT) 8 MG disintegrating tablet Take 1 tablet (8 mg total) by mouth every 8 (eight) hours as needed for nausea or vomiting. 30 tablet 1   Semaglutide, 1 MG/DOSE, (OZEMPIC, 1 MG/DOSE,) 4 MG/3ML SOPN INJECT 1 MG INTO THE SKIN ONE TIME PER WEEK 3 mL 1   Semaglutide, 2 MG/DOSE, (OZEMPIC, 2 MG/DOSE,) 8 MG/3ML SOPN Inject 2 mg into the skin once a week. 3 mL 3   SYMBICORT 160-4.5 MCG/ACT inhaler INHALE 2 PUFFS INTO THE LUNGS IN THE MORNING AND AT BEDTIME. 10.2 each 0   torsemide (DEMADEX) 20 MG tablet TAKE 2 TABLETS BY MOUTH TWICE A DAY 360 tablet 1   traMADol (ULTRAM) 50 MG tablet Take 1 tablet (50 mg total) by mouth every 4 (four) hours as needed. 180 tablet 0   vitamin B-12 (CYANOCOBALAMIN) 1000 MCG tablet Take 1 tablet (1,000 mcg total) by mouth daily. 30 tablet 3   Vitamin E 400 units TABS Take 400 Units by mouth daily.      No current facility-administered medications on file prior to visit.    Family History  Problem Relation Age of Onset   Diabetes Mother    Heart disease Mother        valve leak   Anxiety disorder Mother    Depression Mother    High blood pressure Mother    Kidney failure Mother    Cancer Father        prostate   Heart disease Father    Learning disabilities Sister    Depression Sister    Depression Sister    Anxiety disorder Sister    Colon cancer Neg Hx     Social History   Socioeconomic History   Marital status: Single    Spouse name: Not on file   Number of children: 1   Years of education: 11   Highest education level: Not on file  Occupational History   Occupation: Disabled  Tobacco Use   Smoking status: Former    Packs/day: 0.10    Years: 25.00    Total pack years: 2.50    Types: Cigarettes    Quit date: 05/24/2019    Years since quitting: 2.8   Smokeless tobacco: Never   Tobacco comments:    quit in April.  Vaping Use  Vaping Use: Never used  Substance and Sexual  Activity   Alcohol use: Not Currently    Alcohol/week: 0.0 standard drinks of alcohol    Comment: none sine 05/2018   Drug use: Not Currently    Frequency: 3.0 times per week    Types: Marijuana   Sexual activity: Yes    Birth control/protection: Implant  Other Topics Concern   Not on file  Social History Narrative   Lives at home w/ her mother and daughter   Right-handed   Caffeine: about 3 Cokes per week   Social Determinants of Health   Financial Resource Strain: High Risk (08/14/2021)   Overall Financial Resource Strain (CARDIA)    Difficulty of Paying Living Expenses: Very hard  Food Insecurity: Food Insecurity Present (08/14/2021)   Hunger Vital Sign    Worried About Running Out of Food in the Last Year: Often true    Ran Out of Food in the Last Year: Often true  Transportation Needs: Unmet Transportation Needs (08/14/2021)   PRAPARE - Transportation    Lack of Transportation (Medical): Yes    Lack of Transportation (Non-Medical): Yes  Physical Activity: Insufficiently Active (08/14/2021)   Exercise Vital Sign    Days of Exercise per Week: 1 day    Minutes of Exercise per Session: 40 min  Stress: No Stress Concern Present (08/14/2021)   [place]    Feeling of Stress : Only a little  Social Connections: Moderately Isolated (08/14/2021)   Social Connection and Isolation Panel [NHANES]    Frequency of Communication with Friends and Family: Never    Frequency of Social Gatherings with Friends and Family: More than three times a week    Attends Religious Services: 1 to 4 times per year    Active Member of [employer] or Organizations: No    Attends Archivist Meetings: Never    Marital Status: Never married  Intimate Partner Violence: Not At Risk (08/14/2021)   Humiliation, Afraid, Rape, and Kick questionnaire    Fear of Current or Ex-Partner: No    Emotionally Abused: No    Physically Abused: No     Sexually Abused: No    Review of Systems: ROS negative except for what is noted on the assessment and plan.  Vitals:   03/13/22 0827  BP: 128/81  Pulse: 87  Temp: 98.8 F (37.1 C)  TempSrc: Oral  SpO2: 100%  Weight: (!) 351 lb 1.6 oz (159.3 kg)    Physical Exam: Constitutional: Currently in pain, obese female  HENT: normocephalic atraumatic, mucous membranes moist Eyes: conjunctiva non-erythematous Neck: supple Cardiovascular: regular rate and rhythm, no m/r/g Pulmonary/Chest: normal work of breathing on room air, lungs clear to auscultation bilaterally Abdominal: soft, non-tender, non-distended, CVA tenderness positive, no percussion present, mild tenderness to palpation in all quadrants MSK: normal bulk and tone, range of motion significantly reduced by pain   Assessment & Plan:   Acute left flank pain Patient presents complaining of constant severe left-sided flank pain that radiates to the front of her abdomen on the left side.  The pain is sharp, and is limiting her movement.  She had a fever the day previously at 100.6, but after taking some Tylenol it went down to 99.  She does endorse a few episodes of dysuria, and describes the pain as sharp when she urinates.  She also endorses nausea, but has not actually vomited yet.  On physical exam, CVA tenderness is present  on the left side, but unsure if this is MSK related given patient's significant history for back pain.  Urine dipstick obtained which was negative for any signs of UTI or casts.  Patient has quite an extensive history of pain in different areas including her back.  She has had multiple facet injections, as well as a lateral meniscus tear in her knee 2021.  She also has a history of fibromyalgia.  She has recently tried gabapentin, Lyrica, Cymbalta, amitriptyline, tizanidine but has failed all of them regarding her chronic pain.  Given this constellation of symptoms, current concern is for acute kidney stones, and  have ordered a stat CT for further evaluation.  In the meanwhile encourage patient to continue taking her tramadol that she has her at home, have also sent lidocaine patches to help with the pain.  She was also given a Toradol shot while in the clinic.  Will follow-up CT of abdomen.  Patient discussed with Dr. Hayden Rasmussen Terris Germano, M.D. [facility], PGY-1 Phone: [phone] Date 03/15/2022 Time 8:33 AM

## 2022-03-15 NOTE — Assessment & Plan Note (Signed)
Patient presents complaining of constant severe left-sided flank pain that radiates to the front of her abdomen on the left side.  The pain is sharp, and is limiting her movement.  She had a fever the day previously at 100.6, but after taking some Tylenol it went down to 99.  She does endorse a few episodes of dysuria, and describes the pain as sharp when she urinates.  She also endorses nausea, but has not actually vomited yet.  On physical exam, CVA tenderness is present on the left side, but unsure if this is MSK related given patient's significant history for back pain.  Urine dipstick obtained which was negative for any signs of UTI or casts.  Patient has quite an extensive history of pain in different areas including her back.  She has had multiple facet injections, as well as a lateral meniscus tear in her knee 2021.  She also has a history of fibromyalgia.  She has recently tried gabapentin, Lyrica, Cymbalta, amitriptyline, tizanidine but has failed all of them regarding her chronic pain.  Given this constellation of symptoms, current concern is for acute kidney stones, and have ordered a stat CT for further evaluation.  In the meanwhile encourage patient to continue taking her tramadol that she has her at home, have also sent lidocaine patches to help with the pain.  She was also given a Toradol shot while in the clinic.  Will follow-up CT of abdomen.

## 2022-03-15 NOTE — Telephone Encounter (Signed)
Pt called and instructed to take 4 mg of Tizanidine Q8hr, max 3 times a day per Dr Philipp Ovens. Voiced understanding . And stated she was on 4 mg before not 6 mg.

## 2022-03-15 NOTE — Telephone Encounter (Signed)
I have approved her tizanadine refill but please tell patient that 4 mg every 8 hours or three times a day is the max dose. Please do not take 6 mg.

## 2022-03-16 ENCOUNTER — Emergency Department (HOSPITAL_COMMUNITY): Payer: 59

## 2022-03-16 ENCOUNTER — Other Ambulatory Visit: Payer: Self-pay

## 2022-03-16 ENCOUNTER — Emergency Department (HOSPITAL_COMMUNITY)
Admission: EM | Admit: 2022-03-16 | Discharge: 2022-03-16 | Disposition: A | Discharge status: Home / Self Care | Payer: 59 | Attending: Emergency Medicine | Admitting: Emergency Medicine

## 2022-03-16 ENCOUNTER — Encounter (HOSPITAL_COMMUNITY): Payer: Self-pay | Admitting: *Deleted

## 2022-03-16 DIAGNOSIS — I10 Essential (primary) hypertension: Secondary | ICD-10-CM | POA: Insufficient documentation

## 2022-03-16 DIAGNOSIS — Z79899 Other long term (current) drug therapy: Secondary | ICD-10-CM | POA: Diagnosis not present

## 2022-03-16 DIAGNOSIS — E119 Type 2 diabetes mellitus without complications: Secondary | ICD-10-CM | POA: Diagnosis not present

## 2022-03-16 DIAGNOSIS — Z7984 Long term (current) use of oral hypoglycemic drugs: Secondary | ICD-10-CM | POA: Diagnosis not present

## 2022-03-16 DIAGNOSIS — Z1152 Encounter for screening for COVID-19: Secondary | ICD-10-CM | POA: Diagnosis not present

## 2022-03-16 DIAGNOSIS — R1084 Generalized abdominal pain: Secondary | ICD-10-CM | POA: Insufficient documentation

## 2022-03-16 DIAGNOSIS — R0789 Other chest pain: Secondary | ICD-10-CM | POA: Insufficient documentation

## 2022-03-16 DIAGNOSIS — R109 Unspecified abdominal pain: Secondary | ICD-10-CM

## 2022-03-16 DIAGNOSIS — R0602 Shortness of breath: Secondary | ICD-10-CM | POA: Diagnosis not present

## 2022-03-16 LAB — CBC WITH DIFFERENTIAL/PLATELET
Abs Immature Granulocytes: 0.02 10*3/uL (ref 0.00–0.07)
Basophils Absolute: 0 10*3/uL (ref 0.0–0.1)
Basophils Relative: 0 %
Eosinophils Absolute: 0 10*3/uL (ref 0.0–0.5)
Eosinophils Relative: 0 %
HCT: 39.8 % (ref 36.0–46.0)
Hemoglobin: 12.3 g/dL (ref 12.0–15.0)
Immature Granulocytes: 0 %
Lymphocytes Relative: 27 %
Lymphs Abs: 2.4 10*3/uL (ref 0.7–4.0)
MCH: 26.9 pg (ref 26.0–34.0)
MCHC: 30.9 g/dL (ref 30.0–36.0)
MCV: 87.1 fL (ref 80.0–100.0)
Monocytes Absolute: 0.7 10*3/uL (ref 0.1–1.0)
Monocytes Relative: 8 %
Neutro Abs: 5.8 10*3/uL (ref 1.7–7.7)
Neutrophils Relative %: 65 %
Platelets: 399 10*3/uL (ref 150–400)
RBC: 4.57 MIL/uL (ref 3.87–5.11)
RDW: 17 % — ABNORMAL HIGH (ref 11.5–15.5)
WBC: 9 10*3/uL (ref 4.0–10.5)
nRBC: 0 % (ref 0.0–0.2)

## 2022-03-16 LAB — TROPONIN I (HIGH SENSITIVITY)
Troponin I (High Sensitivity): 4 ng/L (ref <18)
Troponin I (High Sensitivity): 5 ng/L (ref <18)

## 2022-03-16 LAB — COMPREHENSIVE METABOLIC PANEL
ALT: 12 U/L (ref 0–44)
AST: 15 U/L (ref 15–41)
Albumin: 3.7 g/dL (ref 3.5–5.0)
Alkaline Phosphatase: 84 U/L (ref 38–126)
Anion gap: 13 (ref 5–15)
BUN: 6 mg/dL (ref 6–20)
CO2: 24 mmol/L (ref 22–32)
Calcium: 9.4 mg/dL (ref 8.9–10.3)
Chloride: 104 mmol/L (ref 98–111)
Creatinine, Ser: 0.8 mg/dL (ref 0.44–1.00)
GFR, Estimated: >60 mL/min (ref >60)
Glucose, Bld: 101 mg/dL — ABNORMAL HIGH (ref 70–99)
Potassium: 3.4 mmol/L — ABNORMAL LOW (ref 3.5–5.1)
Sodium: 141 mmol/L (ref 135–145)
Total Bilirubin: <0.1 mg/dL — ABNORMAL LOW (ref 0.3–1.2)
Total Protein: 6.9 g/dL (ref 6.5–8.1)

## 2022-03-16 LAB — URINALYSIS, ROUTINE W REFLEX MICROSCOPIC
Bilirubin Urine: NEGATIVE
Glucose, UA: NEGATIVE mg/dL
Hgb urine dipstick: NEGATIVE
Ketones, ur: NEGATIVE mg/dL
Leukocytes,Ua: NEGATIVE
Nitrite: NEGATIVE
Protein, ur: NEGATIVE mg/dL
Specific Gravity, Urine: 1.025 (ref 1.005–1.030)
pH: 6 (ref 5.0–8.0)

## 2022-03-16 LAB — PROTIME-INR
INR: 1.1 (ref 0.8–1.2)
Prothrombin Time: 14.3 seconds (ref 11.4–15.2)

## 2022-03-16 LAB — I-STAT BETA HCG BLOOD, ED (MC, WL, AP ONLY): I-stat hCG, quantitative: <5 m[IU]/mL (ref <5)

## 2022-03-16 LAB — RESP PANEL BY RT-PCR (RSV, FLU A&B, COVID)  RVPGX2
Influenza A by PCR: NEGATIVE
Influenza B by PCR: NEGATIVE
Resp Syncytial Virus by PCR: NEGATIVE
SARS Coronavirus 2 by RT PCR: NEGATIVE

## 2022-03-16 LAB — LACTIC ACID, PLASMA: Lactic Acid, Venous: 1.4 mmol/L (ref 0.5–1.9)

## 2022-03-16 MED ORDER — METHOCARBAMOL 500 MG PO TABS: 500.0000 mg | ORAL_TABLET | Freq: Two times a day (BID) | ORAL | 0 refills | Status: DC

## 2022-03-16 MED ORDER — LIDOCAINE 5 % EX PTCH: 1.0000 | MEDICATED_PATCH | CUTANEOUS | 0 refills | Status: DC

## 2022-03-16 MED ORDER — ACETAMINOPHEN 500 MG PO TABS: 1000.0000 mg | ORAL_TABLET | ORAL | Status: DC

## 2022-03-16 MED ORDER — ACETAMINOPHEN 325 MG PO TABS
650.0000 mg | ORAL_TABLET | Freq: Four times a day (QID) | ORAL | Status: DC | PRN
  Administered 2022-03-16: 650 mg via ORAL
  Filled 2022-03-16 (×2): qty 2

## 2022-03-16 MED ORDER — MORPHINE SULFATE (PF) 4 MG/ML IV SOLN
4.0000 mg | Freq: Once | INTRAVENOUS | Status: AC
  Administered 2022-03-16: 4 mg via INTRAVENOUS
  Filled 2022-03-16: qty 1

## 2022-03-16 MED ORDER — LIDOCAINE 5 % EX PTCH
2.0000 | MEDICATED_PATCH | CUTANEOUS | Status: DC
  Administered 2022-03-16: 2 via TRANSDERMAL
  Filled 2022-03-16: qty 2

## 2022-03-16 MED ORDER — OXYCODONE-ACETAMINOPHEN 5-325 MG PO TABS
1.0000 | ORAL_TABLET | Freq: Once | ORAL | Status: AC
  Administered 2022-03-16: 1 via ORAL
  Filled 2022-03-16: qty 1

## 2022-03-16 NOTE — ED Provider Notes (Signed)
[facility] Provider Note   CSN: [id number] Arrival date & time: 03/16/22  1727     History {Add pertinent medical, surgical, social history, OB history to HPI:1} Chief Complaint  Patient presents with   Chest Pain    Lindsay [name] is a 52 y.o. female.   Chest Pain  Patient is a 52 year old female past medical history including HTN, IBS, hidradenitis suppurativa, chronic nausea, depression, anxiety, obesity, fibromyalgia, migraines, chronic opioid use, dizziness, DM2, HLD,       Home Medications Prior to Admission medications   Medication Sig Start Date End Date Taking? Authorizing Provider  cyclobenzaprine (FLEXERIL) 10 MG tablet Take 1 tablet (10 mg total) by mouth 3 (three) times daily as needed for muscle spasms. 03/15/22   Atway, Rayann N, DO  ACCU-CHEK GUIDE test strip USE ONE TEST STRIP TO CHECK BLOOD SUGARS ONCE A DAY AS NEEDED 11/06/21   Velna Ochs, MD  Accu-Chek Softclix Lancets lancets USE ONE LANCET TO CHECK BLOOD SUGAR ONE A DAY AS NEEDED 02/04/22   Velna Ochs, MD  ADDYI 100 MG TABS TAKE 1 TABLET BY MOUTH EVERY DAY 11/03/20   Donnamae Jude, MD  albuterol (PROVENTIL) (2.5 MG/3ML) 0.083% nebulizer solution TAKE 3 ML (2.5 MG TOTAL) BY NEBULIZATION EVERY 4 HOURS AS NEEDED FOR WHEEZING OR SHORTNESS OF BREATH 07/11/21   Olalere, Adewale A, MD  albuterol (VENTOLIN HFA) 108 (90 Base) MCG/ACT inhaler TAKE 2 PUFFS BY MOUTH EVERY 6 HOURS AS NEEDED FOR WHEEZE OR SHORTNESS OF BREATH 08/03/21   Olalere, Cicero Duck A, MD  Ascorbic Acid (VITAMIN C PO) Take 1 tablet by mouth daily.    [provider]  atorvastatin (LIPITOR) 40 MG tablet Take 1 tablet (40 mg total) by mouth daily. 05/23/21   Velna Ochs, MD  azelastine (ASTELIN) 0.1 % nasal spray Place 1 spray into both nostrils 2 (two) times daily. Use in each nostril as directed 01/17/22   Nani Gasser, MD  benzonatate (TESSALON PERLES) 100 MG capsule Take 1  capsule (100 mg total) by mouth every 6 (six) hours as needed for cough. 01/01/22 01/01/23  Velna Ochs, MD  Blood Glucose Monitoring Suppl (ACCU-CHEK GUIDE ME) w/Device KIT Dispense one device 02/16/20   Velna Ochs, MD  Brexpiprazole (REXULTI) 3 MG TABS Take 1 tablet (3 mg total) by mouth daily. 03/07/22   [name], Arlyce Harman, MD  Capsaicin 0.025 % PTCH Apply 1 patch topically daily as needed. 02/16/20   Velna Ochs, MD  cetirizine (ZYRTEC) 10 MG tablet Take 1 tablet (10 mg total) by mouth daily. 01/09/22   Velna Ochs, MD  clindamycin (CLINDAGEL) 1 % gel APPLY TO AFFECTED AREA TWICE A DAY 02/28/22   Velna Ochs, MD  clorazepate (TRANXENE) 3.75 MG tablet Take 1 tablet (3.75 mg total) by mouth daily. 03/07/22   [name], Arlyce Harman, MD  [facility] D3 25 MCG (1000 UT) capsule TAKE 1 CAPSULE BY MOUTH EVERY DAY 02/26/22   Velna Ochs, MD  diclofenac Sodium (VOLTAREN) 1 % GEL APPLY 2 GRAMS TOPICALLY 4 (FOUR) TIMES DAILY AS NEEDED FOR KNEE PAIN 01/24/22   Velna Ochs, MD  ENTRESTO 49-51 MG Take 1 tablet by mouth 2 (two) times daily. 09/25/20   [provider]  famotidine (PEPCID) 20 MG tablet TAKE 1 TABLET BY MOUTH EVERY DAY 03/06/22   Velna Ochs, MD  ferrous sulfate 325 (65 FE) MG EC tablet Take 1 tablet (325 mg total) by mouth daily with breakfast. 05/24/21 05/24/22  Guilloud,  Hoyle Sauer, MD  fluticasone [facility]) 50 MCG/ACT nasal spray Place 1 spray into both nostrils daily. 01/17/22 01/17/23  Nani Gasser, MD  ipratropium (ATROVENT) 0.06 % nasal spray PLEASE SEE ATTACHED FOR DETAILED DIRECTIONS 04/14/20   [provider]  lamoTRIgine (LAMICTAL) 150 MG tablet Take 1 tablet (150 mg total) by mouth daily. 03/07/22   [name], Arlyce Harman, MD  lidocaine (LIDODERM) 5 % Place 2 patches onto the skin daily. Remove & Discard patch within 12 hours or as directed by MD 03/13/22   Nooruddin, Marlene Lard, MD  lidocaine (XYLOCAINE) 5 % ointment Apply 1 application topically as needed for mild  pain. 09/25/20   Velna Ochs, MD  meclizine (ANTIVERT) 25 MG tablet TAKE 1 TABLET BY MOUTH THREE TIMES A DAY AS NEEDED FOR DIZZINESS 02/28/22   Velna Ochs, MD  megestrol (MEGACE) 40 MG tablet Take 2 tablets (80 mg total) by mouth daily. 04/11/21   Donnamae Jude, MD  metoprolol succinate (TOPROL-XL) 50 MG 24 hr tablet Take 1 tablet (50 mg total) by mouth daily. 05/23/21   Velna Ochs, MD  mirtazapine (REMERON) 15 MG tablet Take 1 tablet (15 mg total) by mouth at bedtime. 03/07/22 03/07/23  [name], Arlyce Harman, MD  omeprazole (PRILOSEC) 40 MG capsule TAKE 1 CAPSULE (40 MG TOTAL) BY MOUTH DAILY. 10/09/21   Angelica Pou, MD  ondansetron (ZOFRAN-ODT) 8 MG disintegrating tablet Take 1 tablet (8 mg total) by mouth every 8 (eight) hours as needed for nausea or vomiting. 03/09/22   Lacinda Axon, MD  Semaglutide, 1 MG/DOSE, (OZEMPIC, 1 MG/DOSE,) 4 MG/3ML SOPN INJECT 1 MG INTO THE SKIN ONE TIME PER WEEK 02/05/22   Velna Ochs, MD  Semaglutide, 2 MG/DOSE, (OZEMPIC, 2 MG/DOSE,) 8 MG/3ML SOPN Inject 2 mg into the skin once a week. 11/12/21   Velna Ochs, MD  SYMBICORT 160-4.5 MCG/ACT inhaler INHALE 2 PUFFS INTO THE LUNGS IN THE MORNING AND AT BEDTIME. 08/08/21   Olalere, Adewale A, MD  torsemide (DEMADEX) 20 MG tablet TAKE 2 TABLETS BY MOUTH TWICE A DAY 09/14/21   Lottie Mussel, MD  traMADol (ULTRAM) 50 MG tablet Take 1 tablet (50 mg total) by mouth every 4 (four) hours as needed. 01/09/22   Velna Ochs, MD  vitamin B-12 (CYANOCOBALAMIN) 1000 MCG tablet Take 1 tablet (1,000 mcg total) by mouth daily. 05/06/17   Velna Ochs, MD  Vitamin E 400 units TABS Take 400 Units by mouth daily.     [provider]      Allergies    Lisinopril    Review of Systems   Review of Systems  Cardiovascular:  Positive for chest pain.    Physical Exam Updated Vital Signs BP (!) 147/81   Pulse 80   Temp 99.2 F (37.3 C)   Resp 17   Ht 5' 10"$  (1.778 m)   Wt (!) 159.3 kg   LMP  02/13/2022   SpO2 98%   BMI 50.39 kg/m  Physical Exam  ED Results / Procedures / Treatments   Labs (all labs ordered are listed, but only abnormal results are displayed) Labs Reviewed  COMPREHENSIVE METABOLIC PANEL - Abnormal; Notable for the following components:      Result Value   Potassium 3.4 (*)    Glucose, Bld 101 (*)    Total Bilirubin <0.1 (*)    All other components within normal limits  CBC WITH DIFFERENTIAL/PLATELET - Abnormal; Notable for the following components:   RDW 17.0 (*)    All other components  within normal limits  RESP PANEL BY RT-PCR (RSV, FLU A&B, COVID)  RVPGX2  CULTURE, BLOOD (ROUTINE X 2)  CULTURE, BLOOD (ROUTINE X 2)  LACTIC ACID, PLASMA  PROTIME-INR  URINALYSIS, ROUTINE W REFLEX MICROSCOPIC  I-STAT BETA HCG BLOOD, ED ([facility], [facility], [facility] ONLY)  TROPONIN I (HIGH SENSITIVITY)  TROPONIN I (HIGH SENSITIVITY)    EKG None  Radiology DG Chest 2 View  Result Date: 03/16/2022 CLINICAL DATA:  52 year old female with suspected sepsis. Shortness of breath. EXAM: CHEST - 2 VIEW COMPARISON:  Chest x-ray 10/07/2020. FINDINGS: Lung volumes are normal. No consolidative airspace disease. No pleural effusions. No pneumothorax. No pulmonary nodule or mass noted. Pulmonary vasculature and the cardiomediastinal silhouette are within normal limits. IMPRESSION: No radiographic evidence of acute cardiopulmonary disease. Electronically Signed   By: Vinnie Langton M.D.   On: 03/16/2022 18:30   CT Abdomen Pelvis Wo Contrast  Result Date: 03/15/2022 CLINICAL DATA:  Acute left flank pain. EXAM: CT ABDOMEN AND PELVIS WITHOUT CONTRAST TECHNIQUE: Multidetector CT imaging of the abdomen and pelvis was performed following the standard protocol without IV contrast. RADIATION DOSE REDUCTION: This exam was performed according to the departmental dose-optimization program which includes automated exposure control, adjustment of the mA and/or kV according to patient size and/or use of  iterative reconstruction technique. COMPARISON:  April 08, 2019. FINDINGS: Lower chest: No acute abnormality. Hepatobiliary: No focal liver abnormality is seen. No gallstones, gallbladder wall thickening, or biliary dilatation. Pancreas: Unremarkable. No pancreatic ductal dilatation or surrounding inflammatory changes. Spleen: Normal in size without focal abnormality. Adrenals/Urinary Tract: Adrenal glands are unremarkable. Kidneys are normal, without renal calculi, focal lesion, or hydronephrosis. Bladder is unremarkable. Stomach/Bowel: Stomach is within normal limits. Appendix appears normal. No evidence of bowel wall thickening, distention, or inflammatory changes. Vascular/Lymphatic: No significant vascular findings are present. No enlarged abdominal or pelvic lymph nodes. Reproductive: Uterus and bilateral adnexa are unremarkable. Other: No abdominal wall hernia or abnormality. No abdominopelvic ascites. Musculoskeletal: No acute or significant osseous findings. IMPRESSION: No definite abnormality seen in the abdomen or pelvis. Electronically Signed   By: Marijo Conception M.D.   On: 03/15/2022 16:14    Procedures Procedures  {Document cardiac monitor, telemetry assessment procedure when appropriate:1}  Medications Ordered in ED Medications  acetaminophen (TYLENOL) tablet 650 mg (650 mg Oral Given 03/16/22 2131)  morphine (PF) 4 MG/ML injection 4 mg (4 mg Intravenous Given 03/16/22 1931)  oxyCODONE-acetaminophen (PERCOCET/ROXICET) 5-325 MG per tablet 1 tablet (1 tablet Oral Given 03/16/22 2131)    ED Course/ Medical Decision Making/ A&P Clinical Course as of 03/16/22 2235  Sat Mar 16, 2022  1913 IMPRESSION: No definite abnormality seen in the abdomen or pelvis.     Electronically Signed   By: Marijo Conception M.D.   On: 03/15/2022 16:14  [WF]    Clinical Course User Index [WF] Tedd Sias, PA   {   Click here for ABCD2, HEART and other calculatorsREFRESH Note before signing :1}                           Medical Decision Making Amount and/or Complexity of Data Reviewed Labs: ordered. Radiology: ordered.  Risk OTC drugs. Prescription drug management.   This patient presents to the ED for concern of L flank pain, this involves a number of treatment options, and is a complaint that carries with it a moderate to high risk of complications and morbidity. A differential diagnosis was considered  for the patient's symptoms which is discussed below:   The differential diagnosis of emergent flank pain includes, but is not limited to :Abdominal aortic aneurysm,, Renal artery embolism,Renal vein thrombosis, Aortic dissection, Mesenteric ischemia, Pyelonephritis, Renal infarction, Renal hemorrhage, Nephrolithiasis/ Renal Colic, Bladder tumor,Cystitis, Biliary colic, Pancreatitis Perforated peptic ulcer Appendicitis ,Inguinal Hernia, Diverticulitis, Bowel obstruction Ectopic Pregnancy,PID/TOA,Ovarian cyst, Ovarian torsion, Shingles Lower lobe pneumonia, Retroperitoneal hematoma/abscess/tumor, Epidural abscess, Epidural hematoma    Co morbidities: Discussed in HPI   Brief History:  ***    EMR reviewed including pt PMHx, past surgical history and past visits to ER.   See HPI for more details   Lab Tests:   {Blank single:19197::"I ordered and independently interpreted labs. Labs notable for","I personally reviewed all laboratory work and imaging. Metabolic panel without any acute abnormality specifically kidney function within normal limits and no significant electrolyte abnormalities. CBC without leukocytosis or significant anemia."}   Imaging Studies:  {Blank single:19197::"NAD. I personally reviewed all imaging studies and no acute abnormality found. I agree with radiology interpretation.","Abnormal findings. I personally reviewed all imaging studies. Imaging notable for","No imaging studies ordered for this patient"}    Cardiac Monitoring:  {Blank  single:19197::"The patient was maintained on a cardiac monitor.  I personally viewed and interpreted the cardiac monitored which showed an underlying rhythm of:","NA"} {Blank single:19197::"EKG non-ischemic","NA"}   Medicines ordered:  I ordered medication including ***  for *** Reevaluation of the patient after these medicines showed that the patient {resolved/improved/worsened:23923::"improved"} I have reviewed the patients home medicines and have made adjustments as needed   Critical Interventions:  ***   Consults/Attending Physician   {Blank single:19197::"I requested consultation with ***,  and discussed lab and imaging findings as well as pertinent plan - they recommend: ***","I discussed this case with my attending physician who cosigned this note including patient's presenting symptoms, physical exam, and planned diagnostics and interventions. Attending physician stated agreement with plan or made changes to plan which were implemented."}   Reevaluation:  After the interventions noted above I re-evaluated patient and found that they have :{resolved/improved/worsened:23923::"improved"}   Social Determinants of Health:  {Blank single:19197::"Given cab voucher","Social work/case management involved","The patient's social determinants of health were a factor in the care of this patient"}    Problem List / ED Course:  ***   Dispostion:  After consideration of the diagnostic results and the patients response to treatment, I feel that the patent would benefit from ***       Final Clinical Impression(s) / ED Diagnoses Final diagnoses:  None    Rx / DC Orders ED Discharge Orders     None

## 2022-03-16 NOTE — ED Triage Notes (Signed)
The pt has been ill one week   elevated temp  chest pain  painful urination   she saw her doctor and had a c-t scan yesterday  her temp is still up  she had tylenol at 1445 today  most of her pain today is in her lt flank  lmp last month

## 2022-03-16 NOTE — Discharge Instructions (Addendum)
Take muscle relaxer as prescribed, Tylenol 1000 mg every 6 hours, hydrate, follow-up with your primary care doctor and return to emergency room for any new or concerning symptoms such as fevers, worsening pain, vomiting or any other new or concerning symptoms.  Otherwise continue to use the medications as described above and apply Lidoderm patches

## 2022-03-18 ENCOUNTER — Telehealth: Payer: Self-pay

## 2022-03-18 ENCOUNTER — Ambulatory Visit (INDEPENDENT_AMBULATORY_CARE_PROVIDER_SITE_OTHER): Payer: 59

## 2022-03-18 ENCOUNTER — Other Ambulatory Visit: Payer: Self-pay

## 2022-03-18 ENCOUNTER — Ambulatory Visit (HOSPITAL_COMMUNITY)
Admission: RE | Admit: 2022-03-18 | Discharge: 2022-03-18 | Disposition: A | Discharge status: Home / Self Care | Payer: 59 | Source: Ambulatory Visit | Attending: Internal Medicine | Admitting: Internal Medicine

## 2022-03-18 VITALS — BP 122/83 | HR 93 | Temp 98.5°F | Ht 71.0 in | Wt 341.4 lb

## 2022-03-18 DIAGNOSIS — M797 Fibromyalgia: Secondary | ICD-10-CM

## 2022-03-18 DIAGNOSIS — M545 Low back pain, unspecified: Secondary | ICD-10-CM | POA: Diagnosis not present

## 2022-03-18 DIAGNOSIS — M546 Pain in thoracic spine: Secondary | ICD-10-CM | POA: Diagnosis not present

## 2022-03-18 DIAGNOSIS — M40204 Unspecified kyphosis, thoracic region: Secondary | ICD-10-CM | POA: Diagnosis not present

## 2022-03-18 MED ORDER — AMITRIPTYLINE HCL 10 MG PO TABS: 10.0000 mg | ORAL_TABLET | Freq: Every day | ORAL | 0 refills | Status: DC

## 2022-03-18 MED ORDER — KETOROLAC TROMETHAMINE 30 MG/ML IJ SOLN
30.0000 mg | Freq: Once | INTRAMUSCULAR | Status: AC
  Administered 2022-03-18: 30 mg via INTRAMUSCULAR

## 2022-03-18 MED ORDER — CELECOXIB 100 MG PO CAPS: 100.0000 mg | ORAL_CAPSULE | Freq: Two times a day (BID) | ORAL | 2 refills | Status: DC

## 2022-03-18 MED ORDER — CELECOXIB 100 MG PO CAPS: 100.0000 mg | ORAL_CAPSULE | Freq: Two times a day (BID) | ORAL | 0 refills | Status: AC

## 2022-03-18 NOTE — Progress Notes (Unsigned)
HTNHFmrEF Chronic and well-controlled on Entresto, metoprolol, and torsemide   Chronic hypoxic respiratory failureOSA Oxygen  T2DMobesity A1c 5.8 12/2021 -1 mg ozempic, increase to 54m once pharmacy not backed up on orders  Fibromyalgia lower back pain Overall, I think the underlying cause of her pain is her weight which places a lot of pressure on her back and knees.  She has no obvious structural causes that required surgical interventions.  We discussed bariatric surgery as an option for weight loss.  She voices concern about surgical complication but agrees with talking to a sPsychologist, sport and exercise  She is currently taking Ozempic 2 mg weekly but endorses GI side effects including stomach upset and bad taste in her mouth.  She is currently taking omeprazole and Pepcid already.  She does not wish to lower her Ozempic dose because it is helping her lose weight.   -Regarding tramadol, will increase her dose to 100 mg every 8 hours.  We signed a pain contract today and went over expectations.  New prescription sent starting 08/16/21. -Her ORT score of 5 which suggest more frequent monitoring.  We will reassess her pain in 2-4-week. -Refill Robaxin 1500 milligram every 8 hours as needed -Advised patient to take Tylenol 1 g every 8 hours as needed -Continue physical therapy -Referral to bariatric surgery placed -Refill short course of Phenergan for her GI upset.  -2 prior pain clinics, suboxone didn't work  Hx CVA LDL 45 12/2021 Lipitor 40 mg  Vitamin D deficiency Vitamin D 41.4 12/2021  IDA Ferritin 166 12/2021  MDD Mirtazapine   HCM: Pap(2020 HPV neg) Mammo optho

## 2022-03-18 NOTE — Patient Instructions (Addendum)
Thank you, Ms.[name] for allowing [name] to provide your care today. Today we discussed :  Back pain: We will try a toradol shot in clinic and give you 2 weeks of celebrex. Do not take ibuprofen or advil on top of this. We will get radiographs of your spine to make sure there is no fracture. We will see you back in 2 weeks. We will also get you physical therapy.   Referrals ordered today:    Referral Orders         Ambulatory referral to Physical Therapy      I have ordered the following medication/changed the following medications:   Stop the following medications: Medications Discontinued During This Encounter  Medication Reason   cyclobenzaprine (FLEXERIL) 10 MG tablet    methocarbamol (ROBAXIN) 500 MG tablet    mirtazapine (REMERON) 15 MG tablet    amitriptyline (ELAVIL) 10 MG tablet      Start the following medications: Meds ordered this encounter  Medications   DISCONTD: amitriptyline (ELAVIL) 10 MG tablet    Sig: Take 1 tablet (10 mg total) by mouth at bedtime.    Dispense:  30 tablet    Refill:  0   ketorolac (TORADOL) 30 MG/ML injection 30 mg   celecoxib (CELEBREX) 100 MG capsule    Sig: Take 1 capsule (100 mg total) by mouth 2 (two) times daily.    Dispense:  60 capsule    Refill:  2     Follow up:  2 weeks      We look forward to seeing you next time. Please call our clinic at [phone] if you have any questions or concerns. The best time to call is Monday-Friday from 9am-4pm, but there is someone available 24/7. If after hours or the weekend, call the main hospital number and ask for the Internal Medicine Resident On-Call. If you need medication refills, please notify your pharmacy one week in advance and they will send [name] a request.   Thank you for trusting me with your care. Wishing you the best!   Iona Coach, MD [facility]

## 2022-03-18 NOTE — Assessment & Plan Note (Signed)
History of T3 compression fracture and fibromyalgia. Presenting with 9 days of constant burning back pain on the L side. Pain starts adjacent to spine and raps around to mid axillary line. Home tramadol, capsaicin patch,Robaxin and flexeril have not helped.On exam there is no bony spinal ptp but there is hyperalgesia and ptp of the paraspinal muscles and back muscles overlying the t10-11 dermatomal distribution. No vesicles or rash to suggest shingles. No weakness, no loss of sensation, no saddle anesthesia, no loss of bowel or bladder control. Diabetes well controlled, do not suspect diabetic mononeuropathy. Will get lumbar and thoracic radiographs to r/o compression fracture. Suspect muscular strain related to weight vs thoracic radiculopathy if not compression fracture.. Will  treat with toradol shot in clinic and celebrex for 2 weeks. She has failed gabapentin, Lyrica, Cymbalta,amitriptyline and tizanidine in the past for lower back and fibromyalgia pain control. Could try some of these again if no improvement on NSAIDS. -toradol injection in clinic -Celecoxib 136m BID x 14 days -F/u thoracic and lumbar spine radiographs -PT referral

## 2022-03-18 NOTE — Progress Notes (Signed)
Internal Medicine Clinic Attending  Case discussed with Dr. Sanjuana Mae  At the time of the visit.  We reviewed the resident's history and exam and pertinent patient test results.  I agree with the assessment, diagnosis, and plan of care documented in the resident's note.

## 2022-03-18 NOTE — Telephone Encounter (Signed)
Return pt's call - stated she needs another Toradol shot for left side pain. Stated she went to the ED on 2/17 ; given Morphine. But stated the pain is back today. She wants to come today; appt scheduled w/Dr Stann Mainland @ [phone] ; she will call transportation and will call back if unable to come. Informed pt, she will be evaluated first and the doctor will decide on treatment.

## 2022-03-18 NOTE — Telephone Encounter (Signed)
Requesting to speak with a nurse about getting injection for pain. States she still having side pain, went to ed yesterday but they didn't give her anything. Please call pt back.

## 2022-03-19 ENCOUNTER — Encounter: Payer: Self-pay | Admitting: Student

## 2022-03-19 NOTE — Progress Notes (Signed)
Internal Medicine Clinic Attending  Case discussed with Dr. Stann Mainland  At the time of the visit.  We reviewed the resident's history and exam and pertinent patient test results.  I agree with the assessment, diagnosis, and plan of care documented in the resident's note.

## 2022-03-19 NOTE — Progress Notes (Signed)
Established Patient Office Visit  Subjective   Patient ID: Lindsay [name], female    DOB: [birth year]  Age: 52 y.o. MRN: [id number]  Chief Complaint  Patient presents with   Flank Pain    Left x 9 days    Ms. Maus is a 52 y/o female with a pmh outlined below. Please see A&P for HPI information.  Flank Pain      Review of Systems  Genitourinary:  Positive for flank pain.  All other systems reviewed and are negative.     Objective:     BP 122/83 (BP Location: Right Arm, Patient Position: Sitting, Cuff Size: Large)   Pulse 93   Temp 98.5 F (36.9 C) (Oral)   Ht 5' 11"$  (1.803 m)   Wt (!) 341 lb 6.4 oz (154.9 kg)   LMP 02/13/2022   SpO2 98%   BMI 47.62 kg/m    Physical Exam Constitutional:      General: She is in acute distress.     Appearance: Normal appearance. She is obese. She is not ill-appearing or toxic-appearing.  Cardiovascular:     Rate and Rhythm: Normal rate and regular rhythm.     Pulses: Normal pulses.     Heart sounds: Normal heart sounds. No murmur heard.    No gallop.  Pulmonary:     Effort: Pulmonary effort is normal. No respiratory distress.     Breath sounds: Normal breath sounds. No wheezing or rales.  Musculoskeletal:     Comments: PTP of the rleft sided paraspinal and back muscles overlying the T10-11 dermatome ranging from adjacent to the spine to the mid axillary line. No ptp of the bony spine. Unable to assess strength of LLE due to pain. Sensation of LLE intact.  Neurological:     Mental Status: She is alert.      No results found for any visits on 03/18/22.    The ASCVD Risk score (Arnett DK, et al., 2019) failed to calculate for the following reasons:   The valid total cholesterol range is 130 to 320 mg/dL    Assessment & Plan:   Problem List Items Addressed This Visit       Other   Fibromyalgia (Chronic)   Relevant Medications   celecoxib (CELEBREX) 100 MG capsule   Thoracic back pain    History of T3 compression  fracture and fibromyalgia. Presenting with 9 days of constant burning back pain on the L side. Pain starts adjacent to spine and raps around to mid axillary line. Home tramadol, capsaicin patch,Robaxin and flexeril have not helped.On exam there is no bony spinal ptp but there is hyperalgesia and ptp of the paraspinal muscles and back muscles overlying the t10-11 dermatomal distribution. No vesicles or rash to suggest shingles. No weakness, no loss of sensation, no saddle anesthesia, no loss of bowel or bladder control. Diabetes well controlled, do not suspect diabetic mononeuropathy. Will get lumbar and thoracic radiographs to r/o compression fracture. Suspect muscular strain related to weight vs thoracic radiculopathy if not compression fracture.. Will  treat with toradol shot in clinic and celebrex for 2 weeks. She has failed gabapentin, Lyrica, Cymbalta,amitriptyline and tizanidine in the past for lower back and fibromyalgia pain control. Could try some of these again if no improvement on NSAIDS. -toradol injection in clinic -Celecoxib 165m BID x 14 days -F/u thoracic and lumbar spine radiographs -PT referral      Relevant Medications   celecoxib (CELEBREX) 100 MG capsule   Other Visit Diagnoses  Lower thoracic back pain    -  Primary   Relevant Medications   ketorolac (TORADOL) 30 MG/ML injection 30 mg (Completed)   celecoxib (CELEBREX) 100 MG capsule   Other Relevant Orders   DG Thoracic Spine 2 View   DG Lumbar Spine 2-3 Views   Ambulatory referral to Physical Therapy       No follow-ups on file.    Iona Coach, MD

## 2022-03-20 ENCOUNTER — Telehealth: Payer: Self-pay

## 2022-03-20 NOTE — Telephone Encounter (Signed)
     Patient  visit on 03/16/2022  at [facility] was for chest pain.  Have you been able to follow up with your primary care physician? Yes  The patient was or was not able to obtain any needed medicine or equipment. Patient was able to obtain medication.  Are there diet recommendations that you are having difficulty following? No  Patient expresses understanding of discharge instructions and education provided has no other needs at this time. Yes   [place] Resource Care Guide   [email or web address]  ?? [id number]   Website: [email or web address]  [email or web address]

## 2022-03-21 ENCOUNTER — Encounter: Payer: Self-pay | Admitting: Internal Medicine

## 2022-03-21 ENCOUNTER — Encounter: Payer: 59 | Admitting: Student

## 2022-03-21 ENCOUNTER — Encounter: Payer: Self-pay | Admitting: Student

## 2022-03-21 DIAGNOSIS — M546 Pain in thoracic spine: Secondary | ICD-10-CM

## 2022-03-21 LAB — CULTURE, BLOOD (ROUTINE X 2)
Culture: NO GROWTH
Culture: NO GROWTH
Special Requests: ADEQUATE

## 2022-03-21 NOTE — Addendum Note (Signed)
Addended by: Iona Coach on: 03/21/2022 03:26 PM   Modules accepted: Orders

## 2022-03-22 ENCOUNTER — Ambulatory Visit (INDEPENDENT_AMBULATORY_CARE_PROVIDER_SITE_OTHER): Payer: 59 | Admitting: Licensed Clinical Social Worker

## 2022-03-22 DIAGNOSIS — F410 Panic disorder [episodic paroxysmal anxiety] without agoraphobia: Secondary | ICD-10-CM | POA: Diagnosis not present

## 2022-03-22 DIAGNOSIS — F411 Generalized anxiety disorder: Secondary | ICD-10-CM

## 2022-03-22 DIAGNOSIS — F331 Major depressive disorder, recurrent, moderate: Secondary | ICD-10-CM

## 2022-03-22 DIAGNOSIS — F4312 Post-traumatic stress disorder, chronic: Secondary | ICD-10-CM | POA: Diagnosis not present

## 2022-03-22 MED ORDER — OXYCODONE-ACETAMINOPHEN 5-325 MG PO TABS: 1.0000 | ORAL_TABLET | ORAL | 0 refills | Status: DC | PRN

## 2022-03-22 NOTE — Progress Notes (Signed)
Virtual Visit via Video Note  I connected with Lindsay [name] on 03/22/22 at  8:00 AM EST by a video enabled telemedicine application and verified that I am speaking with the correct person using two identifiers.  Location: Patient: home Provider: home office   I discussed the limitations of evaluation and management by telemedicine and the availability of in person appointments. The patient expressed understanding and agreed to proceed.  I discussed the assessment and treatment plan with the patient. The patient was provided an opportunity to ask questions and all were answered. The patient agreed with the plan and demonstrated an understanding of the instructions.   The patient was advised to call back or seek an in-person evaluation if the symptoms worsen or if the condition fails to improve as anticipated.  I provided 40 minutes of non-face-to-face time during this encounter.  THERAPIST PROGRESS NOTE  Session Time: 8:00 AM to 8:40 AM  Participation Level: Active  Behavioral Response: CasualAlertIrritable  Type of Therapy: Individual Therapy  Treatment Goals addressed:  patient continue to utilize therapy for  supportive and strength-based interventions, provide treatment interventions in the context of patient continuing to seek help and cope with medical issues, anxiety, triggers, coping with mental health symptoms, utilize therapy as a way for patient to focus on working through current stressors that also helps to distract from pain  ProgressTowards Goals: Progressing-patient struggling with getting the help she needs to medical system assess helpful to have an outlet to talk about frustrations helps with coping  Interventions: Solution Focused, Strength-based, Supportive, and Other: coping  Summary: Lindsay [name] is a 52 y.o. female who presents with therapist noting looking briefly at record that she had engaged in appointments in the medical system.  Patient says they didn't  find anything with all tests. Pain radiating in back to lower left side. They were looking for kidney stones didn't find any. Went to  emergency room because of pain issues. Therapist observed what has seen with patient that ongoing patient has pain and nobody finds anything patient said very disturbing. Gave her morphine took the throbbing away but not the pain. Has DDD which she knew but also diagnosed with scoliosis enlarged muscles on the back. Patient sent a message to doctor asking what the plan for treatment. The doctors she saw did say write a stronger prescription for amitriptyline and Celebrex. [facility] seeing them because doctor not available. What her to go to therapy every other day not going to do every other day not going to do have to go out of the house. Going to wait until sees her regular doctor before taking anything. Not going to do PT where have to find rides only if came to house. Question marks over head not good for her and her mental state. Doesn't want to go back to [facility] spine [facility] pain patient says why go there where they told her that they don't have time or patience. Have been in pain for two weeks. Only stays in bed when breath hurting, couldn't sleep hurting move to right hurting. The amitriptyline isn't work. It is frustrating. Recommendation for pain management but patient with her medical issues puts her a risk as she says will stop her breathing. It is really hard to manage. Haven't pushed herself in last two weeks.  Lindsay [name] is waiting to talk to her he cheers her up. Comes by mostly every day.     Patient not feeling well frustration with medical system therapist providing support  and space for patient to talk about thoughts and feelings as well as validating patient with her frustration.  Therapist focused on some of the positive including her friendship with a man who she sees almost every day therapist assesses that is helpful for distraction particularly helpful  for patient when she is dealing with pain issues.  Therapist provided active listening open questions supportive interventions Suicidal/Homicidal: No  Plan: Return again in 2 weeks.2.  Look at mindfulness for patient as well as processing thoughts and feelings in session  Diagnosis:  Major depressive disorder, recurrent, moderate, generalized anxiety disorder, chronic PTSD, panic attacks  Collaboration of Care: Other none needed  Patient/Guardian was advised Release of Information must be obtained prior to any record release in order to collaborate their care with an outside provider. Patient/Guardian was advised if they have not already done so to contact the registration department to sign all necessary forms in order for [name] to release information regarding their care.   Consent: Patient/Guardian gives verbal consent for treatment and assignment of benefits for services provided during this visit. Patient/Guardian expressed understanding and agreed to proceed.   Lindsay [name], Lindsay [name] 03/22/2022

## 2022-03-22 NOTE — Telephone Encounter (Signed)
Please see mychart messages under encounter tab. Patient with new acute left sided thoracic back pain that radiates around her flank. Had thorough work up recently with negative UA and negative CT abdomen pelvis. Also had plain radiograph with only mild degenerative changes. Patient denies rash. She is in severe pain despite her home tramadol. Labs including CMP, CBC, and lactic acid were all WNL. I tried to reassure patient that work up is normal and her acute pain does not sound life threatening. She says she feels desparate and is requesting something for pain. I agreed to prescribed a very short course of percocet, with the understanding that this is only to get her through the weekend until I can hopefully re evaluate her next week and that percocet will not be a long term medication. Instructed her not to take this with her tramadol. She is requesting home health PT however this will need to be ordered during a face to face visit. Will address at her follow up.

## 2022-03-25 NOTE — Progress Notes (Signed)
Attempted to call patient, left voicemail. Will attempt to call again and also leave my chart message. No fractures of the T or L spine. No change in management at this time.

## 2022-03-26 ENCOUNTER — Encounter: Payer: Self-pay | Admitting: Internal Medicine

## 2022-03-26 NOTE — Telephone Encounter (Signed)
Pt calling back about being seen sooner that 04/03/2022 by her PCP.

## 2022-03-27 ENCOUNTER — Encounter: Payer: 59 | Admitting: Student

## 2022-03-27 ENCOUNTER — Encounter: Payer: Self-pay | Admitting: Internal Medicine

## 2022-03-27 ENCOUNTER — Ambulatory Visit (INDEPENDENT_AMBULATORY_CARE_PROVIDER_SITE_OTHER): Payer: 59 | Admitting: Internal Medicine

## 2022-03-27 ENCOUNTER — Other Ambulatory Visit: Payer: Self-pay | Admitting: Pulmonary Disease

## 2022-03-27 ENCOUNTER — Other Ambulatory Visit: Payer: Self-pay | Admitting: Student

## 2022-03-27 ENCOUNTER — Other Ambulatory Visit: Payer: Self-pay

## 2022-03-27 ENCOUNTER — Other Ambulatory Visit: Payer: Self-pay | Admitting: Internal Medicine

## 2022-03-27 VITALS — BP 93/67 | HR 96 | Temp 98.1°F | Ht 70.0 in | Wt 347.2 lb

## 2022-03-27 DIAGNOSIS — M546 Pain in thoracic spine: Secondary | ICD-10-CM

## 2022-03-27 DIAGNOSIS — E1121 Type 2 diabetes mellitus with diabetic nephropathy: Secondary | ICD-10-CM

## 2022-03-27 DIAGNOSIS — G629 Polyneuropathy, unspecified: Secondary | ICD-10-CM

## 2022-03-27 DIAGNOSIS — R0982 Postnasal drip: Secondary | ICD-10-CM

## 2022-03-27 DIAGNOSIS — M419 Scoliosis, unspecified: Secondary | ICD-10-CM | POA: Insufficient documentation

## 2022-03-27 DIAGNOSIS — R531 Weakness: Secondary | ICD-10-CM | POA: Insufficient documentation

## 2022-03-27 DIAGNOSIS — R059 Cough, unspecified: Secondary | ICD-10-CM

## 2022-03-27 DIAGNOSIS — L732 Hidradenitis suppurativa: Secondary | ICD-10-CM

## 2022-03-27 DIAGNOSIS — M797 Fibromyalgia: Secondary | ICD-10-CM

## 2022-03-27 MED ORDER — OXYCODONE-ACETAMINOPHEN 5-325 MG PO TABS: 1.0000 | ORAL_TABLET | ORAL | 0 refills | Status: DC | PRN

## 2022-03-27 NOTE — Progress Notes (Unsigned)
Subjective:   Patient ID: Lindsay [name] female   DOB: 19-Oct-1970 52 y.o.   MRN: [id number]  HPI: Lindsay [name] is a 52 y.o. female with past medical history outlined below here for follow up of acute left sided flank pain. For further details of today's visit, please refer to the assessment and plan below.  Patient is here for follow up of subacute left thoracic back and flank pain. She was initially seen for this in the ED on 2/17. Work up with CT abdomen pelvis, UA, CBC, CMP, and lactic acid were all unremarkable / within normal limits. Troponin was negative and EKG non ischemic. She presented to our clinic for follow up on 2/19 with on going severe pain limiting her ability to do ADLs. Plain films of her thoracic and lumbar spine were obtained and showed mild diffuse degenerative disc disease and mild scoliosis, but otherwise no acute findings to explain her pain.   Since then she has called in and sent multiple my chart messages to me regarding this pain and which has been veyr debilitating for her. She has a difficult time with ambulating and transfers to the point that she is afraid to eat or drink anything because she doesn't want to walk to the bathroom. I agreed to prescribe a short course of percocet last Friday to get her through the weekend, see telephone encounter note on 2/23.   Today she continues to have severe pain. She did have improvement with the percocet but she has finished the prescription and pain has returned. Tramadol and her muscle relaxers do not help the back pain.   Past Medical History:  Diagnosis Date   Acid reflux    Anemia    Iron Def   Anorexia    CHF (congestive heart failure) (HCC)    Chronic kidney disease    Nephrotic syndrome   Colon polyp 2009   Depression with anxiety    Edema leg    Fibromyalgia    Hemorrhoids    Hidradenitis suppurativa    Hypertension    IBS (irritable bowel syndrome)    Low back pain    Migraines    Morbidly obese (HCC)     Neuromuscular disorder (HCC)    fibromyalgia   Neuropathy    Panic attacks    Polyp of vocal cord or larynx    Tonsil pain    Current Outpatient Medications  Medication Sig Dispense Refill   ACCU-CHEK GUIDE test strip USE ONE TEST STRIP TO CHECK BLOOD SUGARS ONCE A DAY AS NEEDED 100 strip 1   Accu-Chek Softclix Lancets lancets USE ONE LANCET TO CHECK BLOOD SUGAR ONE A DAY AS NEEDED 100 each 1   ADDYI 100 MG TABS TAKE 1 TABLET BY MOUTH EVERY DAY 30 tablet 5   albuterol (PROVENTIL) (2.5 MG/3ML) 0.083% nebulizer solution TAKE 3 ML (2.5 MG TOTAL) BY NEBULIZATION EVERY 4 HOURS AS NEEDED FOR WHEEZING OR SHORTNESS OF BREATH 150 mL 0   albuterol (VENTOLIN HFA) 108 (90 Base) MCG/ACT inhaler TAKE 2 PUFFS BY MOUTH EVERY 6 HOURS AS NEEDED FOR WHEEZE OR SHORTNESS OF BREATH 8.5 each 0   Ascorbic Acid (VITAMIN C PO) Take 1 tablet by mouth daily.     atorvastatin (LIPITOR) 40 MG tablet Take 1 tablet (40 mg total) by mouth daily. 90 tablet 3   azelastine (ASTELIN) 0.1 % nasal spray Place 1 spray into both nostrils 2 (two) times daily. Use in each nostril as directed 30 mL 12  benzonatate (TESSALON PERLES) 100 MG capsule Take 1 capsule (100 mg total) by mouth every 6 (six) hours as needed for cough. 30 capsule 1   Blood Glucose Monitoring Suppl (ACCU-CHEK GUIDE ME) w/Device KIT Dispense one device 1 kit 0   Brexpiprazole (REXULTI) 3 MG TABS Take 1 tablet (3 mg total) by mouth daily. 90 tablet 0   Capsaicin 0.025 % PTCH Apply 1 patch topically daily as needed. 10 patch 9   celecoxib (CELEBREX) 100 MG capsule Take 1 capsule (100 mg total) by mouth 2 (two) times daily for 14 days. 28 capsule 0   cetirizine (ZYRTEC) 10 MG tablet Take 1 tablet (10 mg total) by mouth daily. 90 tablet 1   clindamycin (CLINDAGEL) 1 % gel APPLY TO AFFECTED AREA TWICE A DAY 30 g 0   clorazepate (TRANXENE) 3.75 MG tablet Take 1 tablet (3.75 mg total) by mouth daily. 30 tablet 2   [employer] D3 25 MCG (1000 UT) capsule TAKE 1 CAPSULE BY  MOUTH EVERY DAY 90 capsule 1   diclofenac Sodium (VOLTAREN) 1 % GEL APPLY 2 GRAMS TOPICALLY 4 (FOUR) TIMES DAILY AS NEEDED FOR KNEE PAIN 400 g 1   ENTRESTO 49-51 MG Take 1 tablet by mouth 2 (two) times daily.     famotidine (PEPCID) 20 MG tablet TAKE 1 TABLET BY MOUTH EVERY DAY 90 tablet 1   ferrous sulfate 325 (65 FE) MG EC tablet Take 1 tablet (325 mg total) by mouth daily with breakfast. 90 tablet 1   fluticasone (FLONASE) 50 MCG/ACT nasal spray SPRAY 1 SPRAY INTO BOTH NOSTRILS DAILY. 16 mL 2   ipratropium (ATROVENT) 0.06 % nasal spray PLEASE SEE ATTACHED FOR DETAILED DIRECTIONS     lamoTRIgine (LAMICTAL) 150 MG tablet Take 1 tablet (150 mg total) by mouth daily. 90 tablet 0   lidocaine (LIDODERM) 5 % PLACE 2 PATCHES ONTO THE SKIN DAILY. REMOVE & DISCARD PATCH WITHIN 12 HOURS OR AS DIRECTED BY MD 60 patch 2   meclizine (ANTIVERT) 25 MG tablet TAKE 1 TABLET BY MOUTH THREE TIMES A DAY AS NEEDED FOR DIZZINESS 90 tablet 2   megestrol (MEGACE) 40 MG tablet Take 2 tablets (80 mg total) by mouth daily. 180 tablet 1   metoprolol succinate (TOPROL-XL) 50 MG 24 hr tablet Take 1 tablet (50 mg total) by mouth daily. 90 tablet 3   omeprazole (PRILOSEC) 40 MG capsule TAKE 1 CAPSULE (40 MG TOTAL) BY MOUTH DAILY. 90 capsule 3   ondansetron (ZOFRAN-ODT) 8 MG disintegrating tablet Take 1 tablet (8 mg total) by mouth every 8 (eight) hours as needed for nausea or vomiting. 30 tablet 1   oxyCODONE-acetaminophen (PERCOCET) 5-325 MG tablet Take 1-2 tablets by mouth every 4 (four) hours as needed for severe pain. 30 tablet 0   Semaglutide, 1 MG/DOSE, (OZEMPIC, 1 MG/DOSE,) 4 MG/3ML SOPN INJECT 1 MG INTO THE SKIN ONE TIME PER WEEK 3 mL 2   Semaglutide, 2 MG/DOSE, (OZEMPIC, 2 MG/DOSE,) 8 MG/3ML SOPN Inject 2 mg into the skin once a week. 3 mL 3   SYMBICORT 160-4.5 MCG/ACT inhaler INHALE 2 PUFFS INTO THE LUNGS IN THE MORNING AND AT BEDTIME. 10.2 each 0   tiZANidine (ZANAFLEX) 4 MG tablet TAKE 1 TABLET (4 MG TOTAL) BY MOUTH  EVERY 8 (EIGHT) HOURS AS NEEDED FOR MUSCLE SPASMS 90 tablet 0   torsemide (DEMADEX) 20 MG tablet TAKE 2 TABLETS BY MOUTH TWICE A DAY 360 tablet 1   vitamin B-12 (CYANOCOBALAMIN) 1000 MCG tablet Take 1 tablet (  1,000 mcg total) by mouth daily. 30 tablet 3   Vitamin E 400 units TABS Take 400 Units by mouth daily.      No current facility-administered medications for this visit.   Family History  Problem Relation Age of Onset   Diabetes Mother    Heart disease Mother        valve leak   Anxiety disorder Mother    Depression Mother    High blood pressure Mother    Kidney failure Mother    Cancer Father        prostate   Heart disease Father    Learning disabilities Sister    Depression Sister    Depression Sister    Anxiety disorder Sister    Colon cancer Neg Hx    Social History   Socioeconomic History   Marital status: Single    Spouse name: Not on file   Number of children: 1   Years of education: 11   Highest education level: Not on file  Occupational History   Occupation: Disabled  Tobacco Use   Smoking status: Former    Packs/day: 0.10    Years: 25.00    Total pack years: 2.50    Types: Cigarettes    Quit date: 05/24/2019    Years since quitting: 2.8   Smokeless tobacco: Never   Tobacco comments:    quit in April.  Vaping Use   Vaping Use: Never used  Substance and Sexual Activity   Alcohol use: Not Currently    Alcohol/week: 0.0 standard drinks of alcohol    Comment: none sine 05/2018   Drug use: Not Currently    Frequency: 3.0 times per week    Types: Marijuana   Sexual activity: Yes    Birth control/protection: Implant  Other Topics Concern   Not on file  Social History Narrative   Lives at home w/ her mother and daughter   Right-handed   Caffeine: about 3 Cokes per week   Social Determinants of Health   Financial Resource Strain: High Risk (03/18/2022)   Overall Financial Resource Strain (CARDIA)    Difficulty of Paying Living Expenses: Hard   Food Insecurity: Food Insecurity Present (03/18/2022)   Hunger Vital Sign    Worried About Running Out of Food in the Last Year: Often true    Ran Out of Food in the Last Year: Sometimes true  Transportation Needs: Unmet Transportation Needs (03/18/2022)   PRAPARE - Transportation    Lack of Transportation (Medical): Yes    Lack of Transportation (Non-Medical): Yes  Physical Activity: Insufficiently Active (08/14/2021)   Exercise Vital Sign    Days of Exercise per Week: 1 day    Minutes of Exercise per Session: 40 min  Stress: No Stress Concern Present (08/14/2021)   [place]    Feeling of Stress : Only a little  Social Connections: Moderately Isolated (03/18/2022)   Social Connection and Isolation Panel [NHANES]    Frequency of Communication with Friends and Family: More than three times a week    Frequency of Social Gatherings with Friends and Family: Never    Attends Religious Services: 1 to 4 times per year    Active Member of Clubs or Organizations: No    Attends Archivist Meetings: Never    Marital Status: Never married     Objective:  Physical Exam:  Vitals:   03/27/22 1007  BP: 93/67  Pulse: 96  Temp:  98.1 F (36.7 C)  TempSrc: Oral  SpO2: 100%  Weight: (!) 347 lb 3.2 oz (157.5 kg)  Height: '5\' 10"'$  (1.778 m)     HEENT: Extraoccular muscles intact, no dysconjugate gaze or diplopia  Cardiovascular: RRR, no m/r/g Pulmonary/Chest: Clear bilaterally, normal effort  Extremities / MSK: Tenderness to palpation over thoracic and lumber spin with paraspinal muscle tenderness Neurological: No focal deficit, subjectively weak  Assessment & Plan:   Thoracic back pain I am really not sure what is causing this degree of pain that is out of proportion to her physical exam and work up findings. It is possible this is just pain from her scoliosis and DDD, however findings on xray were characterized as  mild. She does have a history of very difficult to treat fibromyalgia, but this is different from her normal fibromyalgia pain. I agreed to extend her course of percocet for a few more days, d/c tramadol for now which is a relatively new medication. I have considered neuromuscular disorders. However she does not have ocular bulbar muscle weakness findings classic for myasthenia and I believe this is normally presents as painless weakness. But is possible she could have axial or limb myasthenia leading to neuromuscular scoliosis?. I discussed this unlikely possibility with her today and we opted to check MG antibodies. She does report intermittent dysarthria. But most likely this is MSK thoracic / lumbar back sprain vs. Fibromyalgia flare.  -- Refilled percocet x 5 days for this acute pain  -- Continue tizanidine PRN  -- Home health PT orders placed as she is having difficulty ambulating and transferring to get out of the house

## 2022-03-28 ENCOUNTER — Encounter: Payer: Self-pay | Admitting: Internal Medicine

## 2022-03-28 NOTE — Assessment & Plan Note (Addendum)
I am really not sure what is causing this degree of pain that is out of proportion to her physical exam and work up findings. It is possible this is just pain from her scoliosis and DDD, however findings on xray were characterized as mild. She does have a history of very difficult to treat fibromyalgia, but this is different from her normal fibromyalgia pain. I agreed to extend her course of percocet for a few more days, d/c tramadol for now which is a relatively new medication. I have considered neuromuscular disorders. However she does not have ocular bulbar muscle weakness findings classic for myasthenia and I believe this is normally presents as painless weakness. But is possible she could have axial or limb myasthenia leading to neuromuscular scoliosis?. I discussed this unlikely possibility with her today and we opted to check MG antibodies. She does report intermittent dysarthria. But most likely this is MSK thoracic / lumbar back sprain vs. Fibromyalgia flare.  -- Refilled percocet x 5 days for this acute pain  -- Continue tizanidine PRN  -- Home health PT orders placed as she is having difficulty ambulating and transferring to get out of the house

## 2022-03-30 ENCOUNTER — Telehealth: Payer: Self-pay | Admitting: Student

## 2022-03-30 MED ORDER — OXYCODONE-ACETAMINOPHEN 7.5-325 MG PO TABS: 1.0000 | ORAL_TABLET | Freq: Four times a day (QID) | ORAL | 0 refills | Status: DC | PRN

## 2022-03-30 NOTE — Telephone Encounter (Signed)
Received page from patient who informed me that she was recently prescribed Percocet by her PCP Dr. Philipp Ovens but she has ran out of the prescription since she has been taking 2 tablets every 4 hours to control her pain. She reports severe back pain as well as left flank pain that is limiting her mobility and ability to do her ADLs. States she has been referred to home PT and currently waiting for insurance approval. Patient requesting a short course of pain medicine until follow-up with her PCP next week. She is currently taking Tylenol arthritis 650 mg, 2 tablets every 6 hours (5200 mg/24hr) as well as her tizanidine 4 mg every 8 hours and she is still in pain. Discussed with patient that I am willing to prescribe a short course of Percocet but with some changes to her previous prescription to decrease the amount of oxycodone and Tylenol she is taking until follow up with her PCP. Patient also reported some GI symptoms with her Ozempic but I advised her that the symptoms will likely resolve over time but she should discuss it with her PCP if they do not. Patient agreeable to plan. Plan: -Start Percocet 7.5-325 mg every 6 hours as needed for pain for 5 days (20 tabs) -Reduce home Tylenol arthritis 650 mg 2 tabs to every 8 hours, advised patient to take this between her Percocet doses -Continue tizanidine, Voltaren gel and capsaicin patch -Follow-up with PCP as scheduled on 3/6 -Pending insurance auth for Home PT

## 2022-04-01 ENCOUNTER — Telehealth: Payer: Self-pay

## 2022-04-01 NOTE — Telephone Encounter (Signed)
Pt is requesting a call back .Lindsay [name] She stated she was just on the phone with PCP  and forgot to ask for something for her upset stomach  .. Due to the Ozempic  she started back taking

## 2022-04-01 NOTE — Telephone Encounter (Signed)
Attempted to call patient x 2, went to voicemail. Let VM for her to call back.

## 2022-04-03 ENCOUNTER — Other Ambulatory Visit: Payer: Self-pay | Admitting: Student

## 2022-04-03 ENCOUNTER — Encounter: Payer: Self-pay | Admitting: Internal Medicine

## 2022-04-03 ENCOUNTER — Ambulatory Visit (INDEPENDENT_AMBULATORY_CARE_PROVIDER_SITE_OTHER): Payer: 59 | Admitting: Internal Medicine

## 2022-04-03 DIAGNOSIS — R0982 Postnasal drip: Secondary | ICD-10-CM | POA: Diagnosis not present

## 2022-04-03 DIAGNOSIS — F1190 Opioid use, unspecified, uncomplicated: Secondary | ICD-10-CM | POA: Diagnosis not present

## 2022-04-03 DIAGNOSIS — M546 Pain in thoracic spine: Secondary | ICD-10-CM

## 2022-04-03 DIAGNOSIS — E0800 Diabetes mellitus due to underlying condition with hyperosmolarity without nonketotic hyperglycemic-hyperosmolar coma (NKHHC): Secondary | ICD-10-CM | POA: Diagnosis not present

## 2022-04-03 DIAGNOSIS — A084 Viral intestinal infection, unspecified: Secondary | ICD-10-CM

## 2022-04-03 MED ORDER — TRAMADOL HCL 50 MG PO TABS: 50.0000 mg | ORAL_TABLET | ORAL | 0 refills | Status: DC | PRN

## 2022-04-03 NOTE — Assessment & Plan Note (Signed)
Reviewed PDMP.  Sent refill of her 50 mg tramadol every 4 hours as needed.  Follow-up in 4-6 weeks.  Will plan for U tox at that time.

## 2022-04-03 NOTE — Assessment & Plan Note (Signed)
Advised nasal irrigation with Nettie pot, Flonase 2 sprays each nostril daily, and daily cetirizine.

## 2022-04-03 NOTE — Assessment & Plan Note (Signed)
Continues to have severe left middle and lower back pain of unclear etiology, Percocet does not seem to be helping much.  I suspect this is a muscular strain or pain from her degenerative disc disease.  She does get immediate relief from the Percocet but this wears off.  I reiterated her overall negative and reassuring workup.  Discussed that Percocet is not a good long-term option for pain management and we will switch back to her tramadol which we had previously agreed on an a controlled substance contract for her chronic and difficult to treat fibromyalgia.  An order was placed last visit for home health physical therapy and she is still waiting to establish for this.  Follow-up with me again in 4-6 weeks.

## 2022-04-03 NOTE — Progress Notes (Signed)
    This is a telephone encounter between Lindsay [name] and Lindsay [name] on 04/03/2022 for follow up of her acute on chronic pain. The visit was conducted with the patient located at home and Lindsay [name] at [facility]. The patient's identity was confirmed using their DOB and current address. The patient has consented to being evaluated through a telephone encounter and understands the associated risks (an examination cannot be done and the patient may need to come in for an appointment) / benefits (allows the patient to remain at home, decreasing exposure to coronavirus). I personally spent 15 minutes on medical discussion.   CC: Acute on chronic pain  HPI:  [name] is a 52 y.o. female with past medical history outlined below. Patient was contacted for a telehealth appointment for acute on chronic pain follow up. For the details of today's visit, please refer to the assessment and plan.   Assessment & Plan:   Thoracic back pain Continues to have severe left middle and lower back pain of unclear etiology, Percocet does not seem to be helping much.  I suspect this is a muscular strain or pain from her degenerative disc disease.  She does get immediate relief from the Percocet but this wears off.  I reiterated her overall negative and reassuring workup.  Discussed that Percocet is not a good long-term option for pain management and we will switch back to her tramadol which we had previously agreed on an a controlled substance contract for her chronic and difficult to treat fibromyalgia.  An order was placed last visit for home health physical therapy and she is still waiting to establish for this.  Follow-up with me again in 4-6 weeks.  Post-nasal drip Advised nasal irrigation with Nettie pot, Flonase 2 sprays each nostril daily, and daily cetirizine.  Chronic, continuous use of opioids Reviewed PDMP.  Sent refill of her 50 mg tramadol every 4 hours as needed.  Follow-up in 4-6 weeks.  Will  plan for U tox at that time.  Diabetes ([place]) Patient was out of Ozempic for approximately 5 weeks and recently resumed the 1 mg weekly dose.  She is having adverse GI effects.  Advised that she retitrate the medication given that she was off for so long.  Recommended she start with low-dose 0.25 mg weekly x 4 weeks then increase to 0.5 mg weekly, further up titration from there.  She has a complication of bilateral lower extremity neuropathy and she is asking about treatment with high-dose topical capsaicin cream (Qutenza).  This is not something that I prescribed but informed her I will look into a referral if possible.

## 2022-04-03 NOTE — Assessment & Plan Note (Signed)
Patient was out of Ozempic for approximately 5 weeks and recently resumed the 1 mg weekly dose.  She is having adverse GI effects.  Advised that she retitrate the medication given that she was off for so long.  Recommended she start with low-dose 0.25 mg weekly x 4 weeks then increase to 0.5 mg weekly, further up titration from there.  She has a complication of bilateral lower extremity neuropathy and she is asking about treatment with high-dose topical capsaicin cream (Qutenza).  This is not something that I prescribed but informed her I will look into a referral if possible.

## 2022-04-04 ENCOUNTER — Ambulatory Visit (INDEPENDENT_AMBULATORY_CARE_PROVIDER_SITE_OTHER): Payer: 59 | Admitting: Licensed Clinical Social Worker

## 2022-04-04 DIAGNOSIS — F4312 Post-traumatic stress disorder, chronic: Secondary | ICD-10-CM | POA: Diagnosis not present

## 2022-04-04 DIAGNOSIS — F331 Major depressive disorder, recurrent, moderate: Secondary | ICD-10-CM

## 2022-04-04 DIAGNOSIS — F411 Generalized anxiety disorder: Secondary | ICD-10-CM

## 2022-04-04 DIAGNOSIS — F410 Panic disorder [episodic paroxysmal anxiety] without agoraphobia: Secondary | ICD-10-CM | POA: Diagnosis not present

## 2022-04-04 NOTE — Progress Notes (Signed)
Virtual Visit via Video Note  I connected with Lindsay [name] on 04/04/22 at  8:00 AM EST by a video enabled telemedicine application and verified that I am speaking with the correct person using two identifiers.  Location: Patient: home Provider: home office   I discussed the limitations of evaluation and management by telemedicine and the availability of in person appointments. The patient expressed understanding and agreed to proceed.   I discussed the assessment and treatment plan with the patient. The patient was provided an opportunity to ask questions and all were answered. The patient agreed with the plan and demonstrated an understanding of the instructions.   The patient was advised to call back or seek an in-person evaluation if the symptoms worsen or if the condition fails to improve as anticipated.  I provided 49 minutes of non-face-to-face time during this encounter.  THERAPIST PROGRESS NOTE  Session Time: 8:00 AM to 8:49 AM  Participation Level: Active  Behavioral Response: CasualAlertappropriate  Type of Therapy: Individual Therapy  Treatment Goals addressed:  patient continue to utilize therapy for  supportive and strength-based interventions, provide treatment interventions in the context of patient continuing to seek help and cope with medical issues, anxiety, triggers, coping with mental health symptoms, utilize therapy as a way for patient to focus on working through current stressors that also helps to distract from pain  ProgressTowards Goals: Progressing-patient using therapy to cope with stressors noting attitude helpful for coping, work through grief issues  Interventions: Solution Focused, Strength-based, Supportive, and Other: coping  Summary: Lindsay [name] is a 52 y.o. female who presents with doctor gave her Percocet gave her four day then another four days didn't give enough another doctor prescribed 7.5 mg but her doctor said stay with Tramadol but it  doesn't work.  Patient recognizes reason doctor does not want her to get addicted. Patient shared that they gave her morphine and Percocet in ER no breathing problems. Her back hurts really bad and going over to side. Start PT on Monday. Get MRI. Asked qutenza patch penetrates deeper to point where burn a little burning a distraction don't feel pain like that. Doesn't want the pain and if they could figure out what is wrong where she could live kind of normal life.  Therapist like this goal felt it was a good goal as worked on pain issues.  Doctor doesn't know if DDD, scoliosis or arthritis could be a muscle strain. Talked about a diagnosis face droop and pain on left side, speech didn't slow down but doctor asked to test. Told doctor whatever you think. Came back negative. Spine is going to the left patient shares therapist thinks that is good insight what is happening. Mild in findings but major for patient. Patient sent her doctor a long email to tell her that. Communicates how think and feeling how feeling. Good way to communicate and she will either work patient in or get to her. One person said may have to have back surgery patient willing get spinal shots to get pain under control.  Therapist has seen positive results things that was a good idea.  Doesn't want the surgery patient said doesn't want the surgery therapist noted that she is higher risk. PT doesn't know try to be hopeful. Therapist noted helpful for coping. Therapist framed  her relationship as doctor working with her to figure out what is going on. Other doctors came in and look like crazy prescribed psych meds doesn't need psych meds talked to her doctor  before prescribe something they discontinued the meds. Wants them to work together on treatment plan.  Doctor told her not to be in bed all day doesn't lay down as much as used to especially when Lindsay [name] comes over and comes over frequently good person to talk to not her boyfriend. Doing  something to make her laugh. On weekend tried to cook spaghetti but a long process brown meat and saute vegetables season the stuff. Did cook it wish hadn't done it "kicking her butt". Took the medicine she gave only reason even attempted. Try anything that might work.  04/28/22 dad died in May 08, 1985 her friend's dad died last year. On the side of town would plant a [employer], [employer] and do gardening.Therapist explored ways she could spend the day. Patient says time hasn't helped gotten worse for her. Therapist noted ways of understand grief that help. Doesn't know what going to do. Dad did landscaping and different plants and flowers to make the house beautiful. Therapist noted a great job to create beauty through plants and flowers..      Therapist noted patient working with her doctor noted patient feeling that she listens to her and working together on dealing with medical issues therapist noted this is positive a breakthrough of sorts as is for the most part patient gets very discouraged with medical system.  Patient feels she is working with her and trying to figure out what is going on has a good way to communicate with her.  Discussed attitude is helpful in healing to so also reinforced patient staying positive about different options noted this will be helpful in having them worked better for her, in general helps with the healing process.  Talked about mindfulness therapist introduced it as it is used for emotional regulation more awareness of thoughts and feelings create options therapist wants to expand as it is also used in pain will look at that with patient.  Talked about anniversary of father's death therapist talked about ways rituals can be helpful, everybody is different shared about going out to eat to commemorate the anniversary patient talked about doing something with plants and flowers as father was landscaper.  Therapist noted the way we can connect with them and meaning they brought to her life.   Assess helpful for patient to talk about the significant issues for her Suicidal/Homicidal: No  Plan: Return again in 2 weeks.2Look at mindfulness for patient as well as processing thoughts and feelings in session.  Diagnosis:  Major depressive disorder, recurrent, moderate, generalized anxiety disorder, chronic PTSD, panic attacks  Collaboration of Care: Other none needed  Patient/Guardian was advised Release of Information must be obtained prior to any record release in order to collaborate their care with an outside provider. Patient/Guardian was advised if they have not already done so to contact the registration department to sign all necessary forms in order for [name] to release information regarding their care.   Consent: Patient/Guardian gives verbal consent for treatment and assignment of benefits for services provided during this visit. Patient/Guardian expressed understanding and agreed to proceed.   [name], LCSW 04/04/2022

## 2022-04-05 ENCOUNTER — Telehealth: Payer: Self-pay | Admitting: *Deleted

## 2022-04-05 NOTE — Telephone Encounter (Signed)
Received call from Lindsay [name], [place] with [facility]. Requesting VO to delay [facility] for [place] till 3/12 or 3/13 per patient request. Verbal auth given. Will route to PCP for agreement/denial.

## 2022-04-06 ENCOUNTER — Other Ambulatory Visit: Payer: Self-pay | Admitting: Student

## 2022-04-06 LAB — MYASTHENIA GRAVIS PROFILE
AChR Binding Ab, Serum: <0.03 nmol/L (ref 0.00–0.24)
AChR-modulating Ab: 0 % (ref 0–45)
Acetylchol Block Ab: 19 % (ref 0–25)
Anti-striation Abs: NEGATIVE

## 2022-04-06 LAB — MUSK ANTIBODIES: MuSK Antibodies: <1 U/mL

## 2022-04-06 NOTE — Progress Notes (Signed)
Received page on on-call pager. Called Lindsay [name] via telephone and verified identity via DOB and home address. She reports that her pain in the left middle and lower back have resurfaced. She has seen Dr. Philipp Ovens a couple of times over the past week and a half for this same issue. She states that the intensity and quality are similar to what she has been having, no worsening. She is requesting a refill of the medication provided prior to the tramadol (percocet). We discussed that percocet would not be a good long-term option for her pain. I reemphasized ongoing plan to have her work with physical therapy and to use her tramadol and tizanidine (as prescribed) to help manage pain. She understands and will call [facility] on Monday to reschedule a clinic appointment for follow up.   -continue PT -continue tramadol q4 prn, tizanidine q8 prn

## 2022-04-08 NOTE — Telephone Encounter (Signed)
Agree, thank you

## 2022-04-10 ENCOUNTER — Encounter: Payer: Self-pay | Admitting: Obstetrics and Gynecology

## 2022-04-10 ENCOUNTER — Ambulatory Visit (INDEPENDENT_AMBULATORY_CARE_PROVIDER_SITE_OTHER): Payer: 59 | Admitting: Obstetrics and Gynecology

## 2022-04-10 VITALS — BP 142/100 | HR 86 | Wt 341.3 lb

## 2022-04-10 DIAGNOSIS — Z0000 Encounter for general adult medical examination without abnormal findings: Secondary | ICD-10-CM | POA: Diagnosis not present

## 2022-04-10 DIAGNOSIS — Z3046 Encounter for surveillance of implantable subdermal contraceptive: Secondary | ICD-10-CM | POA: Diagnosis not present

## 2022-04-10 DIAGNOSIS — N914 Secondary oligomenorrhea: Secondary | ICD-10-CM | POA: Diagnosis not present

## 2022-04-10 LAB — POCT PREGNANCY, URINE: Preg Test, Ur: NEGATIVE

## 2022-04-10 MED ORDER — ETONOGESTREL 68 MG ~~LOC~~ IMPL
68.0000 mg | DRUG_IMPLANT | Freq: Once | SUBCUTANEOUS | Status: AC
  Administered 2022-04-10: 68 mg via SUBCUTANEOUS

## 2022-04-10 NOTE — Progress Notes (Signed)
GYNECOLOGY VISIT  Patient name: Lindsay [name] MRN [id number]  Date of birth: 1970/10/21 Chief Complaint:   Nexplanon Removal and Re-Insertion   History:  Lindsay [name] is a 52 y.o. G2P0010 being seen today for nexplanon removal and reinsertion. Has been amenorrheic for about 6 months and off megace for about  3 months. Currently sexually active - last active in January, no home UPT. Wants to be sure that she will not get pregnant.   Chart review: nexplanon placed 2020 with megace BID for bleeding control. Negative EMB 10/2018  Past Medical History:  Diagnosis Date   Acid reflux    Anemia    Iron Def   Anorexia    CHF (congestive heart failure) (HCC)    Chronic kidney disease    Nephrotic syndrome   Colon polyp 2009   Depression with anxiety    Edema leg    Fibromyalgia    Hemorrhoids    Hidradenitis suppurativa    Hypertension    IBS (irritable bowel syndrome)    Low back pain    Migraines    Morbidly obese (HCC)    Neuromuscular disorder (HCC)    fibromyalgia   Neuropathy    Panic attacks    Polyp of vocal cord or larynx    Tonsil pain     Past Surgical History:  Procedure Laterality Date   AXILLARY HIDRADENITIS EXCISION     COLONOSCOPY WITH PROPOFOL N/A 05/25/2015   Procedure: COLONOSCOPY WITH PROPOFOL;  Surgeon: Milus Banister, MD;  Location: [facility];  Service: Endoscopy;  Laterality: N/A;   COLONOSCOPY WITH PROPOFOL N/A 12/31/2018   Procedure: COLONOSCOPY WITH PROPOFOL;  Surgeon: Milus Banister, MD;  Location: [facility];  Service: Endoscopy;  Laterality: N/A;   HEMORRHOID SURGERY     with Hidradenitis surgery    INGUINAL HIDRADENITIS EXCISION     TONSILLECTOMY  10/18/2010   by Dr. Wilburn Cornelia   UPPER GASTROINTESTINAL ENDOSCOPY      The following portions of the patient's history were reviewed and updated as appropriate: allergies, current medications, past family history, past medical history, past social history, past surgical history and problem  list.   Health Maintenance:   Last pap     Component Value Date/Time   DIAGPAP  11/18/2018 0911    - Negative for intraepithelial lesion or malignancy (NILM)   [place] Negative 11/18/2018 0911   ADEQPAP  11/18/2018 0911    Satisfactory for evaluation; transformation zone component PRESENT.    High Risk HPV: Positive  Adequacy:  Satisfactory for evaluation, transformation zone component PRESENT  Diagnosis:  Atypical squamous cells of undetermined significance (ASC-US)  Last mammogram: none on file   Review of Systems:  Pertinent items are noted in HPI. Comprehensive review of systems was otherwise negative.   Objective:  Physical Exam BP (!) 142/100   Pulse 86   Wt (!) 341 lb 4.8 oz (154.8 kg)   LMP 02/13/2022   BMI 48.97 kg/m    Physical Exam Vitals and nursing note reviewed.  Constitutional:      Appearance: Normal appearance.  HENT:     Head: Normocephalic and atraumatic.  Pulmonary:     Effort: Pulmonary effort is normal.  Skin:    General: Skin is warm and dry.  Neurological:     General: No focal deficit present.     Mental Status: She is alert.  Psychiatric:        Mood and Affect: Mood normal.  Behavior: Behavior normal.        Thought Content: Thought content normal.        Judgment: Judgment normal.     Nexplanon Removal and Reinsertion Patient identified, informed consent performed, consent signed.   Patient does understand that irregular bleeding is a very common side effect of this medication. She was advised to have backup contraception for one week after replacement of the implant. Pregnancy test in clinic today was negative.  Appropriate time out taken. Nexplanon site identified in left arm.  Area prepped in usual sterile fashon. One ml of 1% lidocaine was used to anesthetize the area at the distal end of the implant. A small stab incision was made right beside the implant on the distal portion. The Nexplanon rod was removed without  difficulty. There was minimal blood loss. There were no complications. Area more inferior and distal was identified and the area was then injected with 3 ml of 1 % lidocaine. She was re-prepped with betadine, Nexplanon removed from packaging, Device confirmed in needle, then inserted full length of needle and withdrawn per handbook instructions. Nexplanon was able to palpated in the patient's arm; patient palpated the insert herself.  There was minimal blood loss. Patient insertion site covered with gauze and a pressure bandage to reduce any bruising. The patient tolerated the procedure well and was given post procedure instructions.  She was advised to have backup contraception for one w     Assessment & Plan:   1. Encounter for removal and reinsertion of Nexplanon Now s/p uncomplicated removal of nexplanon   2. Secondary oligomenorrhea Discussed likely menopausal at this time and that in menopause and at her age, spontaneous pregnancy is very low. Reviewed that since she has been amenorrheic without megace on board, more evidence of menopause. Patient would prefer to be sure that she is not going to get pregnant and has elected for removal and reinsertion. Will obtain [facility] today. Patient ok with re-evaluating need for nexplanon in a year, especially if [facility] in place. Also assured patient that having nexplanon in place not harmful and ok to keep for the full 3 years if preferred.  - [place]  3. Preventative health care Screening mammogram ordered Will return for pap - MM DIGITAL SCREENING BILATERAL; Future  Routine preventative health maintenance measures emphasized.  Darliss Cheney, MD [facility] [place]

## 2022-04-10 NOTE — Patient Instructions (Addendum)
Today we changed your nexplanon. You are aldo due for a pap smear, you will come back to have that done. Your Denn[place] Nancy is also due, an order has been placed and someone from our clinic will contact you.

## 2022-04-10 NOTE — Addendum Note (Signed)
Addended byMariane Baumgarten on: 04/10/2022 09:07 AM   Modules accepted: Orders

## 2022-04-11 LAB — FOLLICLE STIMULATING HORMONE: FSH: 1.5 m[IU]/mL

## 2022-04-12 ENCOUNTER — Telehealth: Payer: Self-pay | Admitting: *Deleted

## 2022-04-12 NOTE — Telephone Encounter (Signed)
No, we will not be continuing oxycodone. We discussed this during her last visit. She can schedule another appointment with me if she wants to discuss. Thanks.

## 2022-04-12 NOTE — Telephone Encounter (Signed)
Patient called in stating PT has "my back hurting." Had PT on 3/11 and 3/14 for leg strengthening. Rates low back pain at 10/10. Radiates to right flank. States tramadol helps with knee pain, and tizanidine helps with leg pain, but neither help with back pain. States back pain is still 10/10 after taking these meds. She is requesting Rx for oxycodone for over the weekend.

## 2022-04-12 NOTE — Telephone Encounter (Signed)
Pt was called and informed of Dr Rivka Safer response. She stated her pain is a 10 and might go to the ER. She wants to schedule an appt ,call transferred to front office. Appt scheduled with Dr Johnney Ou on 3/26.

## 2022-04-18 ENCOUNTER — Other Ambulatory Visit: Payer: Self-pay

## 2022-04-18 ENCOUNTER — Other Ambulatory Visit (HOSPITAL_COMMUNITY): Payer: Self-pay | Admitting: Psychiatry

## 2022-04-18 DIAGNOSIS — M546 Pain in thoracic spine: Secondary | ICD-10-CM

## 2022-04-18 DIAGNOSIS — M797 Fibromyalgia: Secondary | ICD-10-CM

## 2022-04-18 DIAGNOSIS — F331 Major depressive disorder, recurrent, moderate: Secondary | ICD-10-CM

## 2022-04-19 ENCOUNTER — Ambulatory Visit (INDEPENDENT_AMBULATORY_CARE_PROVIDER_SITE_OTHER): Payer: 59 | Admitting: Licensed Clinical Social Worker

## 2022-04-19 DIAGNOSIS — F410 Panic disorder [episodic paroxysmal anxiety] without agoraphobia: Secondary | ICD-10-CM | POA: Diagnosis not present

## 2022-04-19 DIAGNOSIS — F411 Generalized anxiety disorder: Secondary | ICD-10-CM

## 2022-04-19 DIAGNOSIS — F4312 Post-traumatic stress disorder, chronic: Secondary | ICD-10-CM | POA: Diagnosis not present

## 2022-04-19 DIAGNOSIS — F331 Major depressive disorder, recurrent, moderate: Secondary | ICD-10-CM | POA: Diagnosis not present

## 2022-04-19 NOTE — Progress Notes (Signed)
Virtual Visit via Video Note  I connected with Lindsay [name] on 04/19/22 at  8:00 AM EDT by a video enabled telemedicine application and verified that I am speaking with the correct person using two identifiers.  Location: Patient: home Provider: home office   I discussed the limitations of evaluation and management by telemedicine and the availability of in person appointments. The patient expressed understanding and agreed to proceed.   I discussed the assessment and treatment plan with the patient. The patient was provided an opportunity to ask questions and all were answered. The patient agreed with the plan and demonstrated an understanding of the instructions.   The patient was advised to call back or seek an in-person evaluation if the symptoms worsen or if the condition fails to improve as anticipated.  I provided 50 minutes of non-face-to-face time during this encounter.  THERAPIST PROGRESS NOTE  Session Time: 8:00 AM to 8:50 AM  Participation Level: Active  Behavioral Response: CasualAlertdysphoric and depressed related to pain issues but also experiences some positive ones appropriate in session  Type of Therapy: Individual Therapy  Treatment Goals addressed: patient continue to utilize therapy for  supportive and strength-based interventions, provide treatment interventions in the context of patient continuing to seek help and cope with medical issues, anxiety, triggers, coping with mental health symptoms, utilize therapy as a way for patient to focus on working through current stressors that also helps to distract from pain  ProgressTowards Goals: Progressing-patient using therapy to cope with frustrations of addressing medical needs, stressors in general also therapist guiding patient to focus on some things that are helpful in particular distractions, nature nice weather  Interventions: Solution Focused, Strength-based, Supportive, and Other: coping  Summary: Lindsay [name] is a 52 y.o. female who presents with last week PT started changes his schedule doesn't know when coming doesn't always tell her frustrates her. Pushing her to do things even though too much pain so if is too much tells him if feels like it is too much. Wants to switch physical therapist doesn't show her how to do it doesn't learn anything. Feels wasting her weeks and nothing getting done because of his "bullshit". Asked about back now doing PT for legs. Has been up all night.  While in session patient smiling sister acting silly and getting her to laugh. She found a coach journal she gave her and patient says writes her poetry Bible verses in it although so nice so afraid to use it Therapist agrees she would be the same way.  Therapist noted how spirituality is a go to for a lot of people. Every journal gives her too nice don't put anying in because too nice. Pain in back and legs toss and turning and wide awake it has been like that since Sunday or Monday hasn't been able to sleep. In daytime wake like on a lot of caffeine. For Dad's anniversary talked to friend, family and [name]. Talked to people apologized and give condolences. In her mind what heard whatever they said didn't give a dam that was her attitude even though not what they were saying. Can't ignore if going to come. Subconscious notices it something going on things off. No wonder things off.  Therapist noted were not going to be able to get rid of these negative emotions part of life. Patient says expect too much for wrong people spiritual journey when expect something in flesh not going to receive. Move forward with spiritual movement and time. Therapist said very  insightful. Therapist asked her as well about looking for connection and patient said when not looking when recognize it the most connected and in dreams. Therapist noted coping with pain is probably also a spiritual journey. Try to get distracted if can't just lay there with eyes  closed. Has to ask doctor about back and knee injections and the patch that a doctor can order, qutenza patch, that she said would order [employer] guy was in backyard caught off guard when Mom opened door and shared backyard sounded beautiful and patient says she is not only one enjoys it joyful and relaxing. Lift window cat looking at her green eyes weird and spooky eyes, therapist said consoling and patient said not consoling.  [name] came over and sat outside for over hour. Therapist noted distractions help with pain.    Therapist reviewed what we talked about from last session patient not happy with PT talked about her frustration helpful for her to process in session plans to get a new one.  Discussed spirituality is essential for coping.  Talked about anniversary for her father and people try to be caring but patient could not hear it.  Therapist noted her experience people do not know what to say and really sometimes nothing they can say that can fix it just nice to be present for people know you are there and not be absent.  Noted other positive things in life appreciating nature which patient does not helps noted this counting for distraction and how how distraction in general helps with pain patient really understands this with patch which helps a lot noted mindfulness will help with this and look more into it but mainly will be a distraction.  Therapist shared her feeling spring helps mood patient agrees.  Patient though going through a rough time assess helpful to have supportive therapy.  Therapist provided active listening open questions supportive interventions.         Suicidal/Homicidal: No  Plan: Return again in 2 weeks.2 look at material for mindfulness and other therapeutic strategies for pain. 3.  Therapist processed thoughts and feelings in session to help with coping  Diagnosis: Major depressive disorder, recurrent, moderate, generalized anxiety disorder, chronic PTSD, panic  attacks  Collaboration of Care: Other none needed  Patient/Guardian was advised Release of Information must be obtained prior to any record release in order to collaborate their care with an outside provider. Patient/Guardian was advised if they have not already done so to contact the registration department to sign all necessary forms in order for [name] to release information regarding their care.   Consent: Patient/Guardian gives verbal consent for treatment and assignment of benefits for services provided during this visit. Patient/Guardian expressed understanding and agreed to proceed.   [name], [place] 04/19/2022

## 2022-04-20 ENCOUNTER — Other Ambulatory Visit: Payer: Self-pay | Admitting: Pulmonary Disease

## 2022-04-22 ENCOUNTER — Telehealth: Payer: Self-pay

## 2022-04-22 NOTE — Telephone Encounter (Signed)
RTC to patient states she feels like there is something in her vagina.  Is having some cramping no discharge .  Has an appointment with a GYN in may.. Hs called their office to see if she can be seen earlier.  Went to the GYN a couple weeks. Had to cancel clinic appointment tomorrow due to transportation.  Wants to come in to the Clinics to see a female doctor.  No available  appointments  until April.  Will try calling GYN office again to see if she can be worked in or go to the ER.

## 2022-04-22 NOTE — Telephone Encounter (Signed)
Patient called she stated she wasn't able to see her OBGYN doctor anytime soon, patient stated she feels like she had a "piece of flesh" moving around in her vagina"please return patient

## 2022-04-23 ENCOUNTER — Encounter: Payer: 59 | Admitting: Student

## 2022-04-23 ENCOUNTER — Encounter (HOSPITAL_COMMUNITY): Payer: Self-pay | Admitting: Emergency Medicine

## 2022-04-23 ENCOUNTER — Emergency Department (HOSPITAL_COMMUNITY): Payer: 59

## 2022-04-23 ENCOUNTER — Emergency Department (HOSPITAL_COMMUNITY)
Admission: EM | Admit: 2022-04-23 | Discharge: 2022-04-23 | Disposition: A | Discharge status: Home / Self Care | Payer: 59 | Attending: Emergency Medicine | Admitting: Emergency Medicine

## 2022-04-23 ENCOUNTER — Other Ambulatory Visit: Payer: Self-pay

## 2022-04-23 DIAGNOSIS — R300 Dysuria: Secondary | ICD-10-CM | POA: Diagnosis not present

## 2022-04-23 DIAGNOSIS — Z1152 Encounter for screening for COVID-19: Secondary | ICD-10-CM | POA: Diagnosis not present

## 2022-04-23 DIAGNOSIS — R110 Nausea: Secondary | ICD-10-CM | POA: Diagnosis not present

## 2022-04-23 DIAGNOSIS — R102 Pelvic and perineal pain: Secondary | ICD-10-CM | POA: Insufficient documentation

## 2022-04-23 DIAGNOSIS — R509 Fever, unspecified: Secondary | ICD-10-CM | POA: Insufficient documentation

## 2022-04-23 LAB — CBC WITH DIFFERENTIAL/PLATELET
Abs Immature Granulocytes: 0.01 10*3/uL (ref 0.00–0.07)
Basophils Absolute: 0 10*3/uL (ref 0.0–0.1)
Basophils Relative: 1 %
Eosinophils Absolute: 0.2 10*3/uL (ref 0.0–0.5)
Eosinophils Relative: 2 %
HCT: 40 % (ref 36.0–46.0)
Hemoglobin: 12.2 g/dL (ref 12.0–15.0)
Immature Granulocytes: 0 %
Lymphocytes Relative: 54 %
Lymphs Abs: 4.3 10*3/uL — ABNORMAL HIGH (ref 0.7–4.0)
MCH: 26.3 pg (ref 26.0–34.0)
MCHC: 30.5 g/dL (ref 30.0–36.0)
MCV: 86.2 fL (ref 80.0–100.0)
Monocytes Absolute: 0.7 10*3/uL (ref 0.1–1.0)
Monocytes Relative: 10 %
Neutro Abs: 2.5 10*3/uL (ref 1.7–7.7)
Neutrophils Relative %: 33 %
Platelets: 467 10*3/uL — ABNORMAL HIGH (ref 150–400)
RBC: 4.64 MIL/uL (ref 3.87–5.11)
RDW: 16.1 % — ABNORMAL HIGH (ref 11.5–15.5)
WBC: 7.8 10*3/uL (ref 4.0–10.5)
nRBC: 0 % (ref 0.0–0.2)

## 2022-04-23 LAB — APTT: aPTT: 32 seconds (ref 24–36)

## 2022-04-23 LAB — URINALYSIS, ROUTINE W REFLEX MICROSCOPIC
Glucose, UA: NEGATIVE mg/dL
Hgb urine dipstick: NEGATIVE
Ketones, ur: NEGATIVE mg/dL
Leukocytes,Ua: NEGATIVE
Nitrite: NEGATIVE
Protein, ur: NEGATIVE mg/dL
Specific Gravity, Urine: 1.033 — ABNORMAL HIGH (ref 1.005–1.030)
pH: 5 (ref 5.0–8.0)

## 2022-04-23 LAB — WET PREP, GENITAL
Clue Cells Wet Prep HPF POC: NONE SEEN
Sperm: NONE SEEN
Trich, Wet Prep: NONE SEEN
WBC, Wet Prep HPF POC: >=10 — AB (ref <10)
Yeast Wet Prep HPF POC: NONE SEEN

## 2022-04-23 LAB — HIV ANTIBODY (ROUTINE TESTING W REFLEX): HIV Screen 4th Generation wRfx: NONREACTIVE

## 2022-04-23 LAB — COMPREHENSIVE METABOLIC PANEL
ALT: 10 U/L (ref 0–44)
AST: 9 U/L — ABNORMAL LOW (ref 15–41)
Albumin: 3.4 g/dL — ABNORMAL LOW (ref 3.5–5.0)
Alkaline Phosphatase: 74 U/L (ref 38–126)
Anion gap: 10 (ref 5–15)
BUN: 8 mg/dL (ref 6–20)
CO2: 21 mmol/L — ABNORMAL LOW (ref 22–32)
Calcium: 8.8 mg/dL — ABNORMAL LOW (ref 8.9–10.3)
Chloride: 105 mmol/L (ref 98–111)
Creatinine, Ser: 0.95 mg/dL (ref 0.44–1.00)
GFR, Estimated: >60 mL/min (ref >60)
Glucose, Bld: 79 mg/dL (ref 70–99)
Potassium: 3.8 mmol/L (ref 3.5–5.1)
Sodium: 136 mmol/L (ref 135–145)
Total Bilirubin: 0.6 mg/dL (ref 0.3–1.2)
Total Protein: 6.8 g/dL (ref 6.5–8.1)

## 2022-04-23 LAB — RESP PANEL BY RT-PCR (RSV, FLU A&B, COVID)  RVPGX2
Influenza A by PCR: NEGATIVE
Influenza B by PCR: NEGATIVE
Resp Syncytial Virus by PCR: NEGATIVE
SARS Coronavirus 2 by RT PCR: NEGATIVE

## 2022-04-23 LAB — LIPASE, BLOOD: Lipase: 21 U/L (ref 11–51)

## 2022-04-23 LAB — PROTIME-INR
INR: 1.1 (ref 0.8–1.2)
Prothrombin Time: 13.8 seconds (ref 11.4–15.2)

## 2022-04-23 LAB — I-STAT BETA HCG BLOOD, ED (MC, WL, AP ONLY): I-stat hCG, quantitative: <5 m[IU]/mL (ref <5)

## 2022-04-23 LAB — LACTIC ACID, PLASMA: Lactic Acid, Venous: 0.6 mmol/L (ref 0.5–1.9)

## 2022-04-23 MED ORDER — LACTATED RINGERS IV BOLUS (SEPSIS)
1000.0000 mL | Freq: Once | INTRAVENOUS | Status: AC
  Administered 2022-04-23: 1000 mL via INTRAVENOUS

## 2022-04-23 MED ORDER — HYDROMORPHONE HCL 1 MG/ML IJ SOLN
1.0000 mg | Freq: Once | INTRAMUSCULAR | Status: AC
  Administered 2022-04-23: 1 mg via INTRAVENOUS
  Filled 2022-04-23: qty 1

## 2022-04-23 MED ORDER — DOXYCYCLINE HYCLATE 100 MG PO CAPS: 100.0000 mg | ORAL_CAPSULE | Freq: Two times a day (BID) | ORAL | 0 refills | Status: DC

## 2022-04-23 MED ORDER — SODIUM CHLORIDE 0.9 % IV SOLN
1.0000 g | Freq: Once | INTRAVENOUS | Status: AC
  Administered 2022-04-23: 1 g via INTRAVENOUS
  Filled 2022-04-23: qty 10

## 2022-04-23 MED ORDER — OXYCODONE-ACETAMINOPHEN 5-325 MG PO TABS
1.0000 | ORAL_TABLET | Freq: Once | ORAL | Status: AC
  Administered 2022-04-23: 1 via ORAL
  Filled 2022-04-23: qty 1

## 2022-04-23 MED ORDER — IOHEXOL 350 MG/ML SOLN
100.0000 mL | Freq: Once | INTRAVENOUS | Status: AC | PRN
  Administered 2022-04-23: 100 mL via INTRAVENOUS

## 2022-04-23 MED ORDER — ONDANSETRON HCL 4 MG/2ML IJ SOLN
4.0000 mg | Freq: Once | INTRAMUSCULAR | Status: AC
  Administered 2022-04-23: 4 mg via INTRAVENOUS
  Filled 2022-04-23: qty 2

## 2022-04-23 NOTE — ED Provider Notes (Signed)
Accepted handoff at shift change from [facility], Domenic Moras PA-C. Please see prior provider note for more detail.   Briefly: Patient is 52 y.o.   DDX: concern for pelvic pain, vaginal pressure, feeling "contractions" , endorses some white discharge  Plan: While patient has been in the emergency department she was noted to have a fever, temperature of 100, she had a brief hypotensive episode, blood pressure minimum 85/58.  She is chronically on nasal cannula secondary to CHF, additionally with history of fibromyalgia, anxiety, depression, IBS, hypertension, hyperlipidemia, CVA, COPD.  Pelvic exam performed prior to my arrival with no cervical motion tenderness, some whitish discharge, no evidence of cystocele, rectocele, or pelvic organ prolapse.  At time of my handoff patient is pending lab work, CT imaging, and reevaluation after pain control.  She does not seem fluid overloaded at this time so she is receiving some fluids, ceftriaxone to cover for PID, she is only received Percocet for pain, she reports that she is still at around 9/10 out of 10 pain on my reevaluation, will order pain control.  I independently interpreted lab work including CMP, CBC, UA, RVP, wet prep, lactic acid, overall unremarkable findings, she does have moderate bilirubin in the urine, she does not have right upper quadrant pain or liver enzyme findings to suggest an acute gallbladder infection however, unclear etiology for moderate bilirubinuria, her UA would not suggest an acute UTI.  As her pelvic exam findings were somewhat equivocal I think would be reasonable to discharge her with doxycycline for a week to cover for PID since her chlamydia and gonorrhea are still pending, and I recommend follow-up with her OB/GYN.  Discussed if her pain is significant she should return to the emergency department for further evaluation.   I independently interpreted imaging including CT abdomen pelvis with contrast which shows  diverticulosis without diverticulitis, no other significant intra-abdominal or pelvic finding to explain patient's symptoms at this time. I agree with the radiologist interpretation.   RISR  EDTHIS    Dorien Chihuahua 04/23/22 1930    Wyvonnia Dusky, MD 04/23/22 Joen Laura

## 2022-04-23 NOTE — ED Triage Notes (Signed)
Pt states she feels like she is contracting like she is having a baby. The pain has been going on for a month but worsened today. She feels like she has "something" coming out of her vagina. Complains of some white discharge.

## 2022-04-23 NOTE — Telephone Encounter (Signed)
I think it would be best to get her in with GYN. I think she has trouble getting on our exam tables, I've had difficulty getting her PAP smears done in the past.

## 2022-04-23 NOTE — ED Notes (Signed)
Patient verbalizes understanding of discharge instructions. Opportunity for questioning and answers were provided. Armband removed by staff, pt discharged from ED. Pt taken to ED entrance via wheel chair.  

## 2022-04-23 NOTE — Discharge Instructions (Signed)
Please use Tylenol for pain.  You may use 1000 mg of Tylenol every 6 hours.  Not to exceed 4 g of Tylenol within 24 hours.  You can use your home tramadol as well as the Tylenol to help with pain.  If you have significant pain that is not responding to the above I recommend further evaluation in the emergency department if you are unable to control, or you experience new symptoms such as worsening pelvic discharge.  Please follow-up on your portal for the results of your chlamydia, gonorrhea testing, but I would take the entire course of doxycycline unless you receive a call from [name] to tell you to discontinue.

## 2022-04-23 NOTE — ED Provider Notes (Signed)
[place] EMERGENCY DEPARTMENT AT [facility] Provider Note   CSN: [id number] Arrival date & time: 04/23/22  1053     History  Chief Complaint  Patient presents with   Abdominal Pain    Vaginal pain     Lindsay [name] is a 52 y.o. female who presents to emergency department complaining of pelvic/vaginal pain and vaginal bulge. Patient first had intermittent discomfort that started in January 2024 and has progressed in severity over the past 2 months. Patient then palpated a mass/bulge in her vagina while showering 3 days ago. This morning, the vaginal bulge was large enough to prevent patient from inserting her vaginal deodorant suppositories and was causing severe pain. She also endorses fever of 100.52F this morning and dyspareunia over the past few weeks. Last bowel movement 3/25. Patient denies vomiting, constipation, diarrhea, upper abdominal pain, anuria, hematuria, vaginal bleeding, surgical hx.   Abdominal Pain Associated symptoms: dysuria, fever and nausea   Associated symptoms: no chest pain, no chills, no constipation, no diarrhea, no hematuria, no vaginal bleeding and no vomiting        Home Medications Prior to Admission medications   Medication Sig Start Date End Date Taking? Authorizing Provider  ACCU-CHEK GUIDE test strip USE ONE TEST STRIP TO CHECK BLOOD SUGARS ONCE A DAY AS NEEDED 11/06/21   [name], MD  Accu-Chek Softclix Lancets lancets USE ONE LANCET TO CHECK BLOOD SUGAR ONE A DAY AS NEEDED 02/04/22   [name], MD  ADDYI 100 MG TABS TAKE 1 TABLET BY MOUTH EVERY DAY 11/03/20   Reva Bores, MD  albuterol (PROVENTIL) (2.5 MG/3ML) 0.083% nebulizer solution TAKE 3 ML (2.5 MG TOTAL) BY NEBULIZATION EVERY 4 HOURS AS NEEDED FOR WHEEZING OR SHORTNESS OF BREATH 07/11/21   Olalere, Adewale A, MD  albuterol (VENTOLIN HFA) 108 (90 Base) MCG/ACT inhaler TAKE 2 PUFFS BY MOUTH EVERY 6 HOURS AS NEEDED FOR WHEEZE OR SHORTNESS OF BREATH 03/28/22    Olalere, Onnie Boer A, MD  Ascorbic Acid (VITAMIN C PO) Take 1 tablet by mouth daily.    [provider]  atorvastatin (LIPITOR) 40 MG tablet Take 1 tablet (40 mg total) by mouth daily. 05/23/21   [name], MD  azelastine (ASTELIN) 0.1 % nasal spray Place 1 spray into both nostrils 2 (two) times daily. Use in each nostril as directed 01/17/22   Marrianne Mood, MD  benzonatate (TESSALON PERLES) 100 MG capsule Take 1 capsule (100 mg total) by mouth every 6 (six) hours as needed for cough. 01/01/22 01/01/23  [name], MD  Blood Glucose Monitoring Suppl (ACCU-CHEK GUIDE ME) w/Device KIT Dispense one device 02/16/20   [name], MD  Brexpiprazole (REXULTI) 3 MG TABS Take 1 tablet (3 mg total) by mouth daily. 03/07/22   [name], Phillips Grout, MD  Capsaicin 0.025 % PTCH Apply 1 patch topically daily as needed. 02/16/20   [name], MD  cetirizine (ZYRTEC) 10 MG tablet Take 1 tablet (10 mg total) by mouth daily. 01/09/22   [name], MD  clindamycin (CLINDAGEL) 1 % gel APPLY TO AFFECTED AREA TWICE A DAY 03/27/22   [name], MD  clorazepate (TRANXENE) 3.75 MG tablet Take 1 tablet (3.75 mg total) by mouth daily. 03/07/22   [name], Phillips Grout, MD  [facility] D3 25 MCG (1000 UT) capsule TAKE 1 CAPSULE BY MOUTH EVERY DAY 02/26/22   [name], MD  diclofenac Sodium (VOLTAREN) 1 % GEL APPLY 2 GRAMS TOPICALLY 4 (FOUR) TIMES DAILY AS NEEDED FOR KNEE PAIN  01/24/22   [name], MD  ENTRESTO 49-51 MG Take 1 tablet by mouth 2 (two) times daily. 09/25/20   [provider]  famotidine (PEPCID) 20 MG tablet TAKE 1 TABLET BY MOUTH EVERY DAY 03/06/22   [name], MD  ferrous sulfate 325 (65 FE) MG EC tablet Take 1 tablet (325 mg total) by mouth daily with breakfast. 05/24/21 05/24/22  [name], MD  fluticasone (FLONASE) 50 MCG/ACT nasal spray SPRAY 1 SPRAY INTO BOTH NOSTRILS DAILY. 03/27/22   [name], MD  ipratropium (ATROVENT) 0.06 % nasal spray  PLEASE SEE ATTACHED FOR DETAILED DIRECTIONS 04/14/20   [provider]  lamoTRIgine (LAMICTAL) 150 MG tablet Take 1 tablet (150 mg total) by mouth daily. 03/07/22   [name], Phillips Grout, MD  lidocaine (LIDODERM) 5 % PLACE 2 PATCHES ONTO THE SKIN DAILY. REMOVE & DISCARD PATCH WITHIN 12 HOURS OR AS DIRECTED BY MD 03/27/22   [name], MD  meclizine (ANTIVERT) 25 MG tablet TAKE 1 TABLET BY MOUTH THREE TIMES A DAY AS NEEDED FOR DIZZINESS 02/28/22   [name], MD  megestrol (MEGACE) 40 MG tablet Take 2 tablets (80 mg total) by mouth daily. Patient not taking: Reported on 04/10/2022 04/11/21   Reva Bores, MD  metoprolol succinate (TOPROL-XL) 50 MG 24 hr tablet Take 1 tablet (50 mg total) by mouth daily. 05/23/21   [name], MD  omeprazole (PRILOSEC) 40 MG capsule TAKE 1 CAPSULE (40 MG TOTAL) BY MOUTH DAILY. 10/09/21   Miguel Aschoff, MD  ondansetron (ZOFRAN-ODT) 8 MG disintegrating tablet TAKE 1 TABLET BY MOUTH EVERY 8 HOURS AS NEEDED FOR NAUSEA OR VOMITING. 04/03/22   [name], MD  Semaglutide, 1 MG/DOSE, (OZEMPIC, 1 MG/DOSE,) 4 MG/3ML SOPN INJECT 1 MG INTO THE SKIN ONE TIME PER WEEK 03/27/22   [name], MD  Semaglutide, 2 MG/DOSE, (OZEMPIC, 2 MG/DOSE,) 8 MG/3ML SOPN Inject 2 mg into the skin once a week. 11/12/21   [name], MD  SYMBICORT 160-4.5 MCG/ACT inhaler INHALE 2 PUFFS INTO THE LUNGS IN THE MORNING AND AT BEDTIME. 03/28/22   Olalere, Adewale A, MD  tiZANidine (ZANAFLEX) 4 MG tablet TAKE 1 TABLET (4 MG TOTAL) BY MOUTH EVERY 8 (EIGHT) HOURS AS NEEDED FOR MUSCLE SPASMS 03/27/22 03/27/23  [name], MD  torsemide (DEMADEX) 20 MG tablet TAKE 2 TABLETS BY MOUTH TWICE A DAY 09/14/21   Mercie Eon, MD  traMADol (ULTRAM) 50 MG tablet Take 1 tablet (50 mg total) by mouth every 4 (four) hours as needed. 04/03/22 04/03/23  [name], MD  vitamin B-12 (CYANOCOBALAMIN) 1000 MCG tablet Take 1 tablet (1,000 mcg total) by mouth daily. 05/06/17   [name], MD  Vitamin E 400 units TABS Take 400 Units by mouth daily.     [provider]      Allergies    Lisinopril    Review of Systems   Review of Systems  Constitutional:  Positive for fever. Negative for chills.  Cardiovascular:  Negative for chest pain.  Gastrointestinal:  Positive for abdominal pain and nausea. Negative for anal bleeding, constipation, diarrhea and vomiting.  Genitourinary:  Positive for dysuria and pelvic pain. Negative for hematuria and vaginal bleeding.    Physical Exam Updated Vital Signs BP 93/66   Pulse 88   Temp 100 F (37.8 C) (Rectal)   Resp 15   Ht 5\' 10"  (1.778 m)   Wt (!) 154.8 kg   SpO2 100%   BMI 48.97 kg/m  Physical Exam  Vitals reviewed.  Constitutional:      Appearance: She is obese. She is not toxic-appearing.  HENT:     Head: Normocephalic and atraumatic.  Eyes:     Extraocular Movements: Extraocular movements intact.  Cardiovascular:     Rate and Rhythm: Tachycardia present.  Pulmonary:     Effort: Pulmonary effort is normal.     Comments: [place] Abdominal:     General: Bowel sounds are normal.     Palpations: Abdomen is soft.     Tenderness: There is no abdominal tenderness.  Genitourinary:    Vagina: Normal. No vaginal discharge or bleeding.     Uterus: Normal.      Comments: Cervix with some white mucous discharge. Tenderness to palpation of anterior vaginal vault. No significant prolapse appreciated during speculum exam. No swelling of bartholin glands appreciated. Neurological:     Mental Status: She is alert and oriented to person, place, and time.  Psychiatric:        Mood and Affect: Mood normal.        Behavior: Behavior normal.     ED Results / Procedures / Treatments   Labs (all labs ordered are listed, but only abnormal results are displayed) Labs Reviewed  WET PREP, GENITAL - Abnormal; Notable for the following components:      Result Value   WBC, Wet Prep HPF POC >=10 (*)    All other  components within normal limits  URINALYSIS, ROUTINE W REFLEX MICROSCOPIC - Abnormal; Notable for the following components:   Specific Gravity, Urine 1.033 (*)    Bilirubin Urine MODERATE (*)    All other components within normal limits  URINE CULTURE  CULTURE, BLOOD (ROUTINE X 2)  CULTURE, BLOOD (ROUTINE X 2)  RESP PANEL BY RT-PCR (RSV, FLU A&B, COVID)  RVPGX2  HIV ANTIBODY (ROUTINE TESTING W REFLEX)  RPR  LACTIC ACID, PLASMA  LACTIC ACID, PLASMA  COMPREHENSIVE METABOLIC PANEL  CBC WITH DIFFERENTIAL/PLATELET  PROTIME-INR  APTT  LIPASE, BLOOD  I-STAT BETA HCG BLOOD, ED ([facility], [facility], [facility] ONLY)  GC/CHLAMYDIA PROBE AMP ([place]) NOT AT [facility]    EKG None  Radiology DG Chest Port 1 View  Result Date: 04/23/2022 CLINICAL DATA:  Questionable sepsis EXAM: PORTABLE CHEST 1 VIEW COMPARISON:  03/16/2022 FINDINGS: The heart size and mediastinal contours are within normal limits. Both lungs are clear. The visualized skeletal structures are unremarkable. IMPRESSION: No acute abnormality of the lungs in AP portable projection. Electronically Signed   By: Jearld Lesch M.D.   On: 04/23/2022 14:53    Procedures Pelvic exam  Date/Time: 04/23/2022 12:26 PM  Performed by: Dorthy Cooler, PA-C Authorized by: Dorthy Cooler, PA-C  Comments: Chaperone present Fayrene Helper, PA-C). Cervix without lesions. White mucous discharge present. No significant prolapse appreciated on speculum exam. Tenderness to palpation of anterior vaginal vault during bimanual exam.       Medications Ordered in ED Medications  lactated ringers bolus 1,000 mL (has no administration in time range)  cefTRIAXone (ROCEPHIN) 1 g in sodium chloride 0.9 % 100 mL IVPB (has no administration in time range)  oxyCODONE-acetaminophen (PERCOCET/ROXICET) 5-325 MG per tablet 1 tablet (1 tablet Oral Given 04/23/22 1206)    ED Course/ Medical Decision Making/ A&P                             Medical Decision Making I have  reviewed the triage vital signs.  Patient first had  intermittent discomfort that started in January 2024 and has progressed in severity over the past 2 months. Patient then palpated a mass/bulge in her vagina while showering 3 days ago. This morning, the vaginal bulge was large enough to prevent patient from inserting her vaginal deodorant suppositories and was causing severe pain. She also endorses dysuria, fever of 100.28F this morning, and dyspareunia over the past few weeks. Last bowel movement 3/25. Patient denies vomiting, constipation, diarrhea, upper abdominal pain, anuria, hematuria, vaginal bleeding, surgical hx.  During pelvic exam with Chaperone, no significant pelvic organ prolapses were appreciated. Wet mount sample was negative for yeast, trich, and clue cells. Tenderness to palpation of anterior vaginal vault, but no significant cervical motion tenderness appreciated.  During initial workup, patient became hypotensive (95/83). This new finding along with rectal temperature of 100.48F convinced me to undergo a sepsis workup to include blood cultures, lactic acid, CMP, abdominal CT, bolus fluids, and broad spectrum abx.  3:44 PM Care of Lindsay [name] transferred to [facility] and Dr. Renaye Rakers at the end of my shift as the patient will require reassessment once labs/imaging have resulted. Patient presentation, ED course, and plan of care discussed with review of all pertinent labs and imaging. Please see his/her note for further details regarding further ED course and disposition. Plan at time of handoff is to further search for possible sources of infection that could have lead to hypotension and fever. This may be altered or completely changed at the discretion of the oncoming team pending results of further workup.     Amount and/or Complexity of Data Reviewed Labs: ordered.  Risk Prescription drug management.           Final Clinical Impression(s) / ED  Diagnoses Final diagnoses:  None    Rx / DC Orders ED Discharge Orders     None         Dorthy Cooler, [place] 04/23/22 1552    Virgina Norfolk, DO 04/24/22 [phone]

## 2022-04-23 NOTE — Telephone Encounter (Signed)
Thank you :)

## 2022-04-23 NOTE — Telephone Encounter (Signed)
I talked to pt this morning - advised pt to call her GYN this am to see if they had any cancellations and/or schedule her appt sooner or go to the ER. She stated she will go to the ER.

## 2022-04-24 ENCOUNTER — Telehealth: Payer: Self-pay

## 2022-04-24 LAB — URINE CULTURE: Culture: NO GROWTH

## 2022-04-24 LAB — GC/CHLAMYDIA PROBE AMP (~~LOC~~) NOT AT ARMC
Chlamydia: NEGATIVE
Comment: NEGATIVE
Comment: NORMAL
Neisseria Gonorrhea: NEGATIVE

## 2022-04-24 LAB — RPR: RPR Ser Ql: NONREACTIVE

## 2022-04-24 NOTE — Telephone Encounter (Signed)
    Patient  visit on 3/26  at [facility]    Have you been able to follow up with your primary care physician? Yes   The patient was or was not able to obtain any needed medicine or equipment. Yes   Are there diet recommendations that you are having difficulty following? na  Patient expresses understanding of discharge instructions and education provided has no other needs at this time.  Yes      [facility] [phone] [address], [place], [place] [address] Phone: [phone] Email: [email or web address]

## 2022-04-25 ENCOUNTER — Other Ambulatory Visit: Payer: Self-pay | Admitting: Internal Medicine

## 2022-04-25 ENCOUNTER — Other Ambulatory Visit: Payer: Self-pay | Admitting: Pulmonary Disease

## 2022-04-25 DIAGNOSIS — L732 Hidradenitis suppurativa: Secondary | ICD-10-CM

## 2022-04-25 DIAGNOSIS — R059 Cough, unspecified: Secondary | ICD-10-CM

## 2022-04-26 ENCOUNTER — Telehealth: Payer: Self-pay | Admitting: Student

## 2022-04-26 ENCOUNTER — Ambulatory Visit (HOSPITAL_BASED_OUTPATIENT_CLINIC_OR_DEPARTMENT_OTHER): Payer: 59

## 2022-04-26 DIAGNOSIS — R102 Pelvic and perineal pain: Secondary | ICD-10-CM

## 2022-04-26 MED ORDER — OXYCODONE HCL 5 MG PO TABS: 2.5000 mg | ORAL_TABLET | Freq: Three times a day (TID) | ORAL | 0 refills | Status: AC | PRN

## 2022-04-26 NOTE — Telephone Encounter (Signed)
Patient states that she continues to have pelvic pain for which she went to the ED three days prior to today. (3/29). During that time patient underwent CT A/P which was unrevealing. She had no leukoctyosis but on differential was noted to have lymphocytosis. She had a wet prep which was negative for Trich or BV but did have WBCs. She was negative for HIV/RPR/GC. She was discharged with 7d course of doxycycline and told to follow with GYN which is scheduled for 05/2022.    She continues to note pelvic pain. She notes she had a fever to 101 2 days ago, noting she defervesced with APAP. She denies any further discharge. She had scant vaginal bleeding about 2 weeks ago.    She has tried her tramadol, muscle relaxants, APAP, and heating pad all to minimal effect.   Patient will schedule an appointment with our clinic for next week.   We discussed that there is probably little value in returning to the ED. She will however go to the ED if her pain worsens or does not improve. Additionaly she will go to the ED if she spikes another fever.    Discussed with patient that she should take APAP q6hours and wrote her a prescription for oxy 2.5mg  q8hPRN for 3 days.

## 2022-04-28 ENCOUNTER — Other Ambulatory Visit: Payer: Self-pay | Admitting: Internal Medicine

## 2022-04-28 ENCOUNTER — Telehealth: Payer: Self-pay | Admitting: Student

## 2022-04-28 DIAGNOSIS — E785 Hyperlipidemia, unspecified: Secondary | ICD-10-CM

## 2022-04-28 LAB — CULTURE, BLOOD (ROUTINE X 2)
Culture: NO GROWTH
Special Requests: ADEQUATE

## 2022-04-28 NOTE — Telephone Encounter (Signed)
Took BP at 0845 and noted to be in the 123XX123 systolic.   Patient called on call team. She states that for the past week to week and a half she gets dizzy with sitting and with standing. She denies any dizziness with changing her head position. She has never passed out. She takes her blood pressure at home and notes that they are frequently in the 123XX123 systolic when they were previously in the 120s/80s. She was started on her HF medications in 2021 and her dose of entresto was increased 6-8 months ago. She does note that her appetite is decreased due to ozempic, but has been on this medication for 2 years and notes no new changes in appetite.    Of note, patient was recently seen in the ED and she was noted to have a blood pressure in the 123XX123 systolic. She had a normal lactate. No evidence of infection at that time.    Patient is taking the opioids that were prescribed, but is no longer taking her tramadol as it was ineffective. In addition, her opioids were at a low dose.    Discussed with patient that her acute dizziness and hypotension is unclear. Recommended that she present to the ED for further evaluation.   We also discussed discontinuing her entresto and torsemide for now. She has also held her metoprolol.

## 2022-04-29 ENCOUNTER — Emergency Department (HOSPITAL_COMMUNITY)
Admission: EM | Admit: 2022-04-29 | Discharge: 2022-04-29 | Disposition: A | Discharge status: Home / Self Care | Payer: 59 | Attending: Emergency Medicine | Admitting: Emergency Medicine

## 2022-04-29 ENCOUNTER — Emergency Department (HOSPITAL_COMMUNITY): Payer: 59

## 2022-04-29 ENCOUNTER — Other Ambulatory Visit: Payer: Self-pay

## 2022-04-29 ENCOUNTER — Encounter (HOSPITAL_COMMUNITY): Payer: Self-pay

## 2022-04-29 DIAGNOSIS — R000 Tachycardia, unspecified: Secondary | ICD-10-CM | POA: Insufficient documentation

## 2022-04-29 DIAGNOSIS — D219 Benign neoplasm of connective and other soft tissue, unspecified: Secondary | ICD-10-CM

## 2022-04-29 DIAGNOSIS — R102 Pelvic and perineal pain: Secondary | ICD-10-CM | POA: Diagnosis present

## 2022-04-29 DIAGNOSIS — E119 Type 2 diabetes mellitus without complications: Secondary | ICD-10-CM | POA: Insufficient documentation

## 2022-04-29 DIAGNOSIS — Z79899 Other long term (current) drug therapy: Secondary | ICD-10-CM | POA: Insufficient documentation

## 2022-04-29 DIAGNOSIS — B3731 Acute candidiasis of vulva and vagina: Secondary | ICD-10-CM | POA: Insufficient documentation

## 2022-04-29 DIAGNOSIS — D259 Leiomyoma of uterus, unspecified: Secondary | ICD-10-CM | POA: Diagnosis not present

## 2022-04-29 HISTORY — DX: Benign neoplasm of connective and other soft tissue, unspecified: D21.9

## 2022-04-29 LAB — CBC
HCT: 41.7 % (ref 36.0–46.0)
Hemoglobin: 13.1 g/dL (ref 12.0–15.0)
MCH: 26.9 pg (ref 26.0–34.0)
MCHC: 31.4 g/dL (ref 30.0–36.0)
MCV: 85.6 fL (ref 80.0–100.0)
Platelets: 524 10*3/uL — ABNORMAL HIGH (ref 150–400)
RBC: 4.87 MIL/uL (ref 3.87–5.11)
RDW: 15.9 % — ABNORMAL HIGH (ref 11.5–15.5)
WBC: 7.1 10*3/uL (ref 4.0–10.5)
nRBC: 0 % (ref 0.0–0.2)

## 2022-04-29 LAB — COMPREHENSIVE METABOLIC PANEL
ALT: 14 U/L (ref 0–44)
AST: 14 U/L — ABNORMAL LOW (ref 15–41)
Albumin: 4 g/dL (ref 3.5–5.0)
Alkaline Phosphatase: 77 U/L (ref 38–126)
Anion gap: 12 (ref 5–15)
BUN: <5 mg/dL — ABNORMAL LOW (ref 6–20)
CO2: 16 mmol/L — ABNORMAL LOW (ref 22–32)
Calcium: 9.1 mg/dL (ref 8.9–10.3)
Chloride: 111 mmol/L (ref 98–111)
Creatinine, Ser: 0.95 mg/dL (ref 0.44–1.00)
GFR, Estimated: >60 mL/min (ref >60)
Glucose, Bld: 104 mg/dL — ABNORMAL HIGH (ref 70–99)
Potassium: 3.5 mmol/L (ref 3.5–5.1)
Sodium: 139 mmol/L (ref 135–145)
Total Bilirubin: 0.6 mg/dL (ref 0.3–1.2)
Total Protein: 7.7 g/dL (ref 6.5–8.1)

## 2022-04-29 LAB — WET PREP, GENITAL
Clue Cells Wet Prep HPF POC: NONE SEEN
Sperm: NONE SEEN
Trich, Wet Prep: NONE SEEN
WBC, Wet Prep HPF POC: <10 (ref <10)

## 2022-04-29 LAB — I-STAT BETA HCG BLOOD, ED (MC, WL, AP ONLY): I-stat hCG, quantitative: <5 m[IU]/mL (ref <5)

## 2022-04-29 LAB — URINALYSIS, ROUTINE W REFLEX MICROSCOPIC
Glucose, UA: NEGATIVE mg/dL
Hgb urine dipstick: NEGATIVE
Ketones, ur: NEGATIVE mg/dL
Leukocytes,Ua: NEGATIVE
Nitrite: NEGATIVE
Protein, ur: NEGATIVE mg/dL
Specific Gravity, Urine: 1.016 (ref 1.005–1.030)
pH: 5 (ref 5.0–8.0)

## 2022-04-29 LAB — LIPASE, BLOOD: Lipase: 24 U/L (ref 11–51)

## 2022-04-29 MED ORDER — KETOROLAC TROMETHAMINE 60 MG/2ML IM SOLN
15.0000 mg | Freq: Once | INTRAMUSCULAR | Status: AC
  Administered 2022-04-29: 15 mg via INTRAMUSCULAR
  Filled 2022-04-29 (×2): qty 2

## 2022-04-29 MED ORDER — MORPHINE SULFATE (PF) 4 MG/ML IV SOLN
4.0000 mg | Freq: Once | INTRAVENOUS | Status: AC
  Administered 2022-04-29: 4 mg via INTRAVENOUS
  Filled 2022-04-29: qty 1

## 2022-04-29 MED ORDER — GADOBUTROL 1 MMOL/ML IV SOLN
10.0000 mL | Freq: Once | INTRAVENOUS | Status: AC | PRN
  Administered 2022-04-29: 10 mL via INTRAVENOUS

## 2022-04-29 MED ORDER — KETOROLAC TROMETHAMINE 60 MG/2ML IM SOLN: 60.0000 mg | Freq: Once | INTRAMUSCULAR | Status: DC

## 2022-04-29 MED ORDER — FLUCONAZOLE 150 MG PO TABS
150.0000 mg | ORAL_TABLET | Freq: Once | ORAL | Status: AC
  Administered 2022-04-29: 150 mg via ORAL
  Filled 2022-04-29: qty 1

## 2022-04-29 MED ORDER — LORAZEPAM 1 MG PO TABS
1.0000 mg | ORAL_TABLET | Freq: Once | ORAL | Status: AC
  Administered 2022-04-29: 1 mg via ORAL
  Filled 2022-04-29: qty 1

## 2022-04-29 NOTE — ED Notes (Signed)
Pt gone to MRI 

## 2022-04-29 NOTE — ED Triage Notes (Signed)
Reports was here on 3/26 for same pelvic pain.  Was told to come back if it got worse. Patient reports it has got worse and vaginal discharge is yellowish white.  Patient denies urinary symptoms.

## 2022-04-29 NOTE — Discharge Instructions (Addendum)
Today your ultrasound and your MRI of your pelvis were all reassuring.  You were found to have a yeast infection and this was treated.  You are also found to have uterine fibroids, please follow-up with your gynecologist.  Return to the ER if you have worsening pain, fever, chills, or intractable nausea or vomiting.

## 2022-04-29 NOTE — ED Provider Notes (Signed)
[facility] Provider Note   CSN: [id number] Arrival date & time: 04/29/22  0932     History  Chief Complaint  Patient presents with   Pelvic Pain    Laterrica Trentadue is a 52 y.o. female, history of diabetes, who presents to the ED secondary to severe pelvic pain since 3/21, that it has not gotten any better.  She states that she has had a severe pelvic pain, with vaginal discharge that is white, with yellow tent, that has been persistent for the last 2 weeks.  When here and was told that everything is fine and to follow-up with OB/GYN.  Follow-up with her PCP, and pain did not improve.  Was put on oxycodone codon, still having pain.  Denies any urinary symptoms.  Had a CAT scan done 2 weeks ago which was unremarkable.  Also test for STDs, and put on PID treatment, with no relief of her symptoms, negative testing.  She states the pain is severe and sharp and stabbing in her vagina.  Denies any dysuria, urinary frequency or urgency.  Bowels have been fine.  Is not sexually active, and has no history of abdominal surgeries.    Home Medications Prior to Admission medications   Medication Sig Start Date End Date Taking? Authorizing Provider  ACCU-CHEK GUIDE test strip USE ONE TEST STRIP TO CHECK BLOOD SUGARS ONCE A DAY AS NEEDED 11/06/21   Velna Ochs, MD  Accu-Chek Softclix Lancets lancets USE ONE LANCET TO CHECK BLOOD SUGAR ONE A DAY AS NEEDED 02/04/22   Velna Ochs, MD  ADDYI 100 MG TABS TAKE 1 TABLET BY MOUTH EVERY DAY 11/03/20   Donnamae Jude, MD  albuterol (PROVENTIL) (2.5 MG/3ML) 0.083% nebulizer solution TAKE 3 ML (2.5 MG TOTAL) BY NEBULIZATION EVERY 4 HOURS AS NEEDED FOR WHEEZING OR SHORTNESS OF BREATH 07/11/21   Olalere, Adewale A, MD  albuterol (VENTOLIN HFA) 108 (90 Base) MCG/ACT inhaler TAKE 2 PUFFS BY MOUTH EVERY 6 HOURS AS NEEDED FOR WHEEZE OR SHORTNESS OF BREATH 04/23/22   Olalere, Cicero Duck A, MD  Ascorbic Acid (VITAMIN C PO) Take 1  tablet by mouth daily.    [provider]  atorvastatin (LIPITOR) 40 MG tablet Take 1 tablet (40 mg total) by mouth daily. 05/23/21   Velna Ochs, MD  azelastine (ASTELIN) 0.1 % nasal spray Place 1 spray into both nostrils 2 (two) times daily. Use in each nostril as directed 01/17/22   Nani Gasser, MD  benzonatate (TESSALON PERLES) 100 MG capsule Take 1 capsule (100 mg total) by mouth every 6 (six) hours as needed for cough. 01/01/22 01/01/23  Velna Ochs, MD  Blood Glucose Monitoring Suppl (ACCU-CHEK GUIDE ME) w/Device KIT Dispense one device 02/16/20   Velna Ochs, MD  Brexpiprazole (REXULTI) 3 MG TABS Take 1 tablet (3 mg total) by mouth daily. 03/07/22   [name], Arlyce Harman, MD  Capsaicin 0.025 % PTCH Apply 1 patch topically daily as needed. 02/16/20   Velna Ochs, MD  cetirizine (ZYRTEC) 10 MG tablet Take 1 tablet (10 mg total) by mouth daily. 01/09/22   Velna Ochs, MD  clindamycin (CLINDAGEL) 1 % gel APPLY TO AFFECTED AREA TWICE A DAY 04/25/22   Velna Ochs, MD  clorazepate (TRANXENE) 3.75 MG tablet Take 1 tablet (3.75 mg total) by mouth daily. 03/07/22   [name], Arlyce Harman, MD  [facility] D3 25 MCG (1000 UT) capsule TAKE 1 CAPSULE BY MOUTH EVERY DAY 02/26/22   Velna Ochs, MD  diclofenac  Sodium (VOLTAREN) 1 % GEL APPLY 2 GRAMS TOPICALLY 4 (FOUR) TIMES DAILY AS NEEDED FOR KNEE PAIN 01/24/22   Velna Ochs, MD  doxycycline (VIBRAMYCIN) 100 MG capsule Take 1 capsule (100 mg total) by mouth 2 (two) times daily. 04/23/22   Prosperi, Christian H, PA-C  ENTRESTO 49-51 MG Take 1 tablet by mouth 2 (two) times daily. 09/25/20   [provider]  famotidine (PEPCID) 20 MG tablet TAKE 1 TABLET BY MOUTH EVERY DAY 03/06/22   Velna Ochs, MD  ferrous sulfate 325 (65 FE) MG EC tablet Take 1 tablet (325 mg total) by mouth daily with breakfast. 05/24/21 05/24/22  Velna Ochs, MD  fluticasone (FLONASE) 50 MCG/ACT nasal spray SPRAY 1 SPRAY INTO BOTH NOSTRILS DAILY.  03/27/22   Velna Ochs, MD  ipratropium (ATROVENT) 0.06 % nasal spray PLEASE SEE ATTACHED FOR DETAILED DIRECTIONS 04/14/20   [provider]  lamoTRIgine (LAMICTAL) 150 MG tablet Take 1 tablet (150 mg total) by mouth daily. 03/07/22   [name], Arlyce Harman, MD  lidocaine (LIDODERM) 5 % PLACE 2 PATCHES ONTO THE SKIN DAILY. REMOVE & DISCARD PATCH WITHIN 12 HOURS OR AS DIRECTED BY MD 03/27/22   Velna Ochs, MD  meclizine (ANTIVERT) 25 MG tablet TAKE 1 TABLET BY MOUTH THREE TIMES A DAY AS NEEDED FOR DIZZINESS 02/28/22   Velna Ochs, MD  megestrol (MEGACE) 40 MG tablet Take 2 tablets (80 mg total) by mouth daily. Patient not taking: Reported on 04/10/2022 04/11/21   Donnamae Jude, MD  metoprolol succinate (TOPROL-XL) 50 MG 24 hr tablet Take 1 tablet (50 mg total) by mouth daily. 05/23/21   Velna Ochs, MD  omeprazole (PRILOSEC) 40 MG capsule TAKE 1 CAPSULE (40 MG TOTAL) BY MOUTH DAILY. 10/09/21   Angelica Pou, MD  ondansetron (ZOFRAN-ODT) 8 MG disintegrating tablet TAKE 1 TABLET BY MOUTH EVERY 8 HOURS AS NEEDED FOR NAUSEA OR VOMITING. 04/03/22   Velna Ochs, MD  oxyCODONE (ROXICODONE) 5 MG immediate release tablet Take 0.5 tablets (2.5 mg total) by mouth every 8 (eight) hours as needed for up to 3 days for severe pain. 04/26/22 04/29/22  Rick Duff, MD  Semaglutide, 1 MG/DOSE, (OZEMPIC, 1 MG/DOSE,) 4 MG/3ML SOPN INJECT 1 MG INTO THE SKIN ONE TIME PER WEEK 03/27/22   Velna Ochs, MD  Semaglutide, 2 MG/DOSE, (OZEMPIC, 2 MG/DOSE,) 8 MG/3ML SOPN Inject 2 mg into the skin once a week. 11/12/21   Velna Ochs, MD  SYMBICORT 160-4.5 MCG/ACT inhaler INHALE 2 PUFFS INTO THE LUNGS IN THE MORNING AND AT BEDTIME. 03/28/22   Olalere, Adewale A, MD  tiZANidine (ZANAFLEX) 4 MG tablet TAKE 1 TABLET (4 MG TOTAL) BY MOUTH EVERY 8 (EIGHT) HOURS AS NEEDED FOR MUSCLE SPASMS 03/27/22 03/27/23  Velna Ochs, MD  torsemide (DEMADEX) 20 MG tablet TAKE 2 TABLETS BY MOUTH TWICE A DAY  09/14/21   Lottie Mussel, MD  traMADol (ULTRAM) 50 MG tablet Take 1 tablet (50 mg total) by mouth every 4 (four) hours as needed. 04/03/22 04/03/23  Velna Ochs, MD  vitamin B-12 (CYANOCOBALAMIN) 1000 MCG tablet Take 1 tablet (1,000 mcg total) by mouth daily. 05/06/17   Velna Ochs, MD  Vitamin E 400 units TABS Take 400 Units by mouth daily.     [provider]      Allergies    Lisinopril    Review of Systems   Review of Systems  Genitourinary:  Positive for pelvic pain and vaginal discharge. Negative for decreased urine volume and vaginal bleeding.  Physical Exam Updated Vital Signs BP (!) 150/87 (BP Location: Right Arm)   Pulse 97   Temp 99.1 F (37.3 C) (Oral)   Resp 18   Ht 5\' 10"  (1.778 m)   Wt (!) 154.7 kg   SpO2 100%   BMI 48.93 kg/m  Physical Exam Vitals and nursing note reviewed. Exam conducted with a chaperone present.  Constitutional:      General: She is in acute distress.     Appearance: She is well-developed. She is obese.  HENT:     Head: Normocephalic and atraumatic.  Eyes:     Conjunctiva/sclera: Conjunctivae normal.  Cardiovascular:     Rate and Rhythm: Regular rhythm. Tachycardia present.     Heart sounds: No murmur heard. Pulmonary:     Effort: Pulmonary effort is normal. No respiratory distress.     Breath sounds: Normal breath sounds.  Abdominal:     Palpations: Abdomen is soft.     Tenderness: There is no abdominal tenderness.  Genitourinary:    Labia:        Right: No rash or injury.        Left: No rash or injury.      Urethra: No prolapse or urethral swelling.     Vagina: Vaginal discharge present.     Cervix: Discharge present.     Uterus: Normal.   Musculoskeletal:        General: No swelling.     Cervical back: Neck supple.  Skin:    General: Skin is warm and dry.     Capillary Refill: Capillary refill takes less than 2 seconds.  Neurological:     Mental Status: She is alert.  Psychiatric:        Mood and  Affect: Mood normal.     ED Results / Procedures / Treatments   Labs (all labs ordered are listed, but only abnormal results are displayed) Labs Reviewed  WET PREP, GENITAL - Abnormal; Notable for the following components:      Result Value   Yeast Wet Prep HPF POC PRESENT (*)    All other components within normal limits  COMPREHENSIVE METABOLIC PANEL - Abnormal; Notable for the following components:   CO2 16 (*)    Glucose, Bld 104 (*)    BUN <5 (*)    AST 14 (*)    All other components within normal limits  CBC - Abnormal; Notable for the following components:   RDW 15.9 (*)    Platelets 524 (*)    All other components within normal limits  URINALYSIS, ROUTINE W REFLEX MICROSCOPIC - Abnormal; Notable for the following components:   Bilirubin Urine Teira Arcilla (*)    All other components within normal limits  LIPASE, BLOOD  I-STAT BETA HCG BLOOD, ED ([facility], [facility], [facility] ONLY)    EKG EKG Interpretation  Date/Time:  Monday April 29 2022 10:00:56 EDT Ventricular Rate:  126 PR Interval:  112 QRS Duration: 76 QT Interval:  402 QTC Calculation: 582 R Axis:   43 Text Interpretation: Sinus tachycardia ST & T wave abnormality, consider inferior ischemia Abnormal ECG Confirmed by Godfrey Pick [id number]) on 04/29/2022 12:06:51 PM  Radiology MR PELVIS W WO CONTRAST  Result Date: 04/29/2022 CLINICAL DATA:  Severe pelvic pain. EXAM: MRI PELVIS WITHOUT AND WITH CONTRAST TECHNIQUE: Multiplanar multisequence MR imaging of the pelvis was performed both before and after administration of intravenous contrast. CONTRAST:  56mL GADAVIST GADOBUTROL 1 MMOL/ML IV SOLN COMPARISON:  Ultrasound earlier 04/29/2022.  CT 04/23/2022  FINDINGS: Urinary Tract: Preserved contours of the urinary bladder. Slight wall thickening and trabeculation. Bowel: Visualized bowel in the pelvis is preserved. Vascular/Lymphatic: No pathologically enlarged lymph nodes or other significant abnormality. Reproductive: Uterus: Measures. Uterus  measures 8.0 x 4.1 x 5.2 cm. Endometrial stripe measures 8 mm. There is a focal low T2 signal lesion along the fundus on the right side consistent with a Ameyah Bangura fibroid measuring 1.7 by 1.5 by 1.7 cm. This fibroid has moderate enhancement. This a abut the underlying endometrium with some mass effect. Subendometrial component. Aaryn Sermon nabothian cysts. No retraction of the uterus. Right ovary: Billy Rocco follicles are seen. Right ovary measures 3.2 x 1.1 x 2.6 cm preserved enhancement. Left ovary: Saphire Barnhart follicles. Left ovary measures 2.9 x 1.5 by 2.1 cm. Preserved enhancement. Other: No free fluid in the pelvis. Musculoskeletal:  Unremarkable. IMPRESSION: Normal appearance of the ovaries.  No free fluid. Ruthene Methvin uterine fibroid. Electronically Signed   By: Jill Side M.D.   On: 04/29/2022 17:14   US Pelvis Complete  Result Date: 04/29/2022 CLINICAL DATA:  Severe pelvic pain EXAM: TRANSABDOMINAL AND TRANSVAGINAL ULTRASOUND OF PELVIS DOPPLER ULTRASOUND OF OVARIES TECHNIQUE: Both transabdominal and transvaginal ultrasound examinations of the pelvis were performed. Transabdominal technique was performed for global imaging of the pelvis including uterus, ovaries, adnexal regions, and pelvic cul-de-sac. It was necessary to proceed with endovaginal exam following the transabdominal exam to visualize the endometrium and ovaries. Color and duplex Doppler ultrasound was utilized to evaluate blood flow to the ovaries. COMPARISON:  CT 04/23/2022.  Ultrasound March 2016 FINDINGS: Uterus Measurements: 7.1 x 3.7 x 3.7 = volume: 50.7 mL. Hypoechoic area along the right side of the uterus measures 16 x 13 x 15 mm consistent with a Ciro Tashiro fibroid. Endometrium Thickness: 10 mm.  No focal abnormality visualized. Right ovary Measurements: 17 x 14 x 16 mm = volume: 2.0 mL. Normal appearance/no adnexal mass. Blood flow seen on color Doppler. Left ovary Poorly seen with overlapping bowel gas and soft tissue. If there is further concern of the left  ovary additional workup with MRI or other process as clinically directed. Other findings No abnormal free fluid. Of note the transabdominal images are very limited due to overlapping bowel gas and soft tissue and a contracted urinary bladder. IMPRESSION: Limited study by overlapping bowel gas and soft tissue. Kristilyn Coltrane uterine fibroid. Poor visualization of the left ovary.  No separate adnexal mass. Electronically Signed   By: Jill Side M.D.   On: 04/29/2022 12:28   US Transvaginal Non-OB  Result Date: 04/29/2022 CLINICAL DATA:  Severe pelvic pain EXAM: TRANSABDOMINAL AND TRANSVAGINAL ULTRASOUND OF PELVIS DOPPLER ULTRASOUND OF OVARIES TECHNIQUE: Both transabdominal and transvaginal ultrasound examinations of the pelvis were performed. Transabdominal technique was performed for global imaging of the pelvis including uterus, ovaries, adnexal regions, and pelvic cul-de-sac. It was necessary to proceed with endovaginal exam following the transabdominal exam to visualize the endometrium and ovaries. Color and duplex Doppler ultrasound was utilized to evaluate blood flow to the ovaries. COMPARISON:  CT 04/23/2022.  Ultrasound March 2016 FINDINGS: Uterus Measurements: 7.1 x 3.7 x 3.7 = volume: 50.7 mL. Hypoechoic area along the right side of the uterus measures 16 x 13 x 15 mm consistent with a Shanta Hartner fibroid. Endometrium Thickness: 10 mm.  No focal abnormality visualized. Right ovary Measurements: 17 x 14 x 16 mm = volume: 2.0 mL. Normal appearance/no adnexal mass. Blood flow seen on color Doppler. Left ovary Poorly seen with overlapping bowel gas and soft tissue. If  there is further concern of the left ovary additional workup with MRI or other process as clinically directed. Other findings No abnormal free fluid. Of note the transabdominal images are very limited due to overlapping bowel gas and soft tissue and a contracted urinary bladder. IMPRESSION: Limited study by overlapping bowel gas and soft tissue. Lorita Forinash  uterine fibroid. Poor visualization of the left ovary.  No separate adnexal mass. Electronically Signed   By: Jill Side M.D.   On: 04/29/2022 12:28    Procedures Procedures    Medications Ordered in ED Medications  morphine (PF) 4 MG/ML injection 4 mg (4 mg Intravenous Given 04/29/22 1104)  morphine (PF) 4 MG/ML injection 4 mg (4 mg Intravenous Given 04/29/22 1302)  fluconazole (DIFLUCAN) tablet 150 mg (150 mg Oral Given 04/29/22 1310)  LORazepam (ATIVAN) tablet 1 mg (1 mg Oral Given 04/29/22 1544)  gadobutrol (GADAVIST) 1 MMOL/ML injection 10 mL (10 mLs Intravenous Contrast Given 04/29/22 1649)  ketorolac (TORADOL) injection 15 mg (15 mg Intramuscular Given 04/29/22 1717)    ED Course/ Medical Decision Making/ A&P                             Medical Decision Making Patient is a 52 year old female, here for pelvic pain has been going on for the last couple weeks, she had negative CT abdomen pelvis, at her last hospitalization/ER visit, which was negative.  She states that she was treated for PID, and still having pain.  We will obtain a ultrasound for further evaluation for her ovaries.  As well as labs.  Amount and/or Complexity of Data Reviewed Labs: ordered.    Details: Unremarkable Radiology: ordered.    Details: Ultrasound unremarkable, however not showing a left ovary.  MRI obtained, just shows fibroid, no other acute findings Discussion of management or test interpretation with external provider(s): Discussed with patient, ultrasound MRI fairly unremarkable only found to have fibroid.  MRI obtained secondary to not showing left ovary on ultrasound.  There are no acute no acute findings.  I also did a pelvic exam, there shows no evidence of prolapse, trauma to the area.  Urethra seems to be intact as well, no trauma there.  Her labs are fairly unremarkable other than a positive yeast.  Which she was treated for with Diflucan.  I encouraged her to follow-up with gynecology and provided her  with 2 different information for follow-up.  Return precautions emphasized.  Risk Prescription drug management.    Final Clinical Impression(s) / ED Diagnoses Final diagnoses:  Pelvic pain in female  Fibroid  Yeast infection of the vagina    Rx / DC Orders ED Discharge Orders     None         Treson Laura, Si Gaul, PA 04/29/22 1734    Godfrey Pick, MD 05/01/22 1644

## 2022-04-30 ENCOUNTER — Telehealth: Payer: Self-pay | Admitting: Obstetrics and Gynecology

## 2022-04-30 ENCOUNTER — Telehealth: Payer: Self-pay

## 2022-04-30 ENCOUNTER — Other Ambulatory Visit: Payer: Self-pay | Admitting: Internal Medicine

## 2022-04-30 ENCOUNTER — Telehealth (HOSPITAL_COMMUNITY): Payer: Self-pay | Admitting: *Deleted

## 2022-04-30 DIAGNOSIS — F1190 Opioid use, unspecified, uncomplicated: Secondary | ICD-10-CM

## 2022-04-30 NOTE — Telephone Encounter (Signed)
Pt called requesting medication for sleep. Pt states that she has been diagnosed with Diverticulitis and Fibroid tumors and " the Mirtazapine is not working". Pt has script for Remeron 15 mg QHS sent on 03/07/22 #90. Pt also has a prescription for Amitriptyline 10 mg QHS #30 sent on 03/18/22 by outside provider. Pt also has the Tranxene 3.75 mg 1 QD for anxiety sent on 03/07/22 #30 with 2 refills. Pt has a f/u appointment scheduled for 06/05/22. Unclear what she's currently taking other than Remeron. Please review and advise.

## 2022-04-30 NOTE — Telephone Encounter (Signed)
Return to pt's call. Stated she went to the ER yesterday; MRI was done. She has an appt 4/18 with [facility]. Stated she cannot do PT hurting; "just need something to take the edge off".

## 2022-04-30 NOTE — Telephone Encounter (Signed)
Called pt and pt informed that she is having severe pain that Tylenol is not effective.  Pt states that she is not able to take Naproxen or ibuprofen as she has kidney failure.  I explained to the pt that she would have to be seen by a provider for her pain.  Pt requests an earlier appt as 05/16/22 to far out to suffer with the pain.  Pt asked if she can come in on 05/06/22.  Pt stated that she would need to call me back about transportation.    Frances Nickels

## 2022-04-30 NOTE — Telephone Encounter (Signed)
Requesting medication for pelvic pain, please call pt back.

## 2022-04-30 NOTE — Telephone Encounter (Signed)
Patient want to speak with a nurse about her eD visit yesterday

## 2022-04-30 NOTE — Telephone Encounter (Signed)
I am sorry to hear she is in pain. Unfortunately it is not safe for me to keep making changes to her medication over the phone. I informed her of this during her last appointment with me. She can schedule an appointment to discuss. Thanks.

## 2022-04-30 NOTE — Telephone Encounter (Signed)
Thank you :)

## 2022-04-30 NOTE — Telephone Encounter (Signed)
I reviewed her medication list.  I do not see if she is taking mirtazapine.  She may need to go back on mirtazapine.  I will not add more medication due to polypharmacy.

## 2022-04-30 NOTE — Telephone Encounter (Signed)
Called and informed pt of Dr Rivka Safer response; she needs to schedule an appt. I informed pt at this time we have only one available appt this week and her PCP does not have any appts. She finally accepted the appt with Dr Raymondo Band @ 0945 Am on Thursday.

## 2022-05-01 ENCOUNTER — Telehealth (HOSPITAL_COMMUNITY): Payer: Self-pay | Admitting: Licensed Clinical Social Worker

## 2022-05-01 ENCOUNTER — Encounter: Payer: Self-pay | Admitting: Physician Assistant

## 2022-05-01 ENCOUNTER — Telehealth (HOSPITAL_COMMUNITY): Payer: Self-pay | Admitting: *Deleted

## 2022-05-01 ENCOUNTER — Other Ambulatory Visit (HOSPITAL_COMMUNITY): Payer: Self-pay | Admitting: *Deleted

## 2022-05-01 MED ORDER — DOXEPIN HCL 10 MG PO CAPS: ORAL_CAPSULE | ORAL | 1 refills | Status: DC

## 2022-05-01 NOTE — Addendum Note (Signed)
Addended by: Truddie Crumble on: 05/01/2022 04:47 PM   Modules accepted: Orders

## 2022-05-01 NOTE — Telephone Encounter (Signed)
Patient called requesting to speak with therapist. Explained that I would send a message to therapist with the request and informed her that therapist had no openings on her schedule this afternoon. Reminded her she has an appointment scheduled for this Friday 05/03/2022. Patient stated "I need to talk with her today within the hour. I've just found out I have tumors." I repeated that I would send the message and also advised her that she could go to the [facility] if she needed to be seen urgently. She is aware of that option, "You mean you're telling me to go to [place]? I don't know anyone there to open up to." Explained that [facility] has multiple types of practitioners who can assist her with her needs. She gave a contact number of [phone] which is different from the number on record for her.

## 2022-05-01 NOTE — Telephone Encounter (Signed)
Writer reached out to pt regarding request for another sleeping medication yesterday. Pt says the Elavil did not work and that's why she doesn't have any refills. She also stressed that Remeron does not help her to sleep that it actually does the opposite she says. Writer reviewed polypharmacy with pt and she stated that you could stop some of her meds, that she just needs to sleep. Please review and advise.

## 2022-05-01 NOTE — Progress Notes (Unsigned)
CC: chronic pain/pelvic pain  HPI:  Lindsay [name] is a 52 y.o. female living with a history stated below and presents today for follow-up of her chronic pain/and new acute pelvic pain. Please see problem based assessment and plan for additional details.  Past Medical History:  Diagnosis Date   Acid reflux    Anemia    Iron Def   Anorexia    CHF (congestive heart failure)    Chronic kidney disease    Nephrotic syndrome   Colon polyp 2009   Depression with anxiety    Edema leg    Fibromyalgia    Hemorrhoids    Hidradenitis suppurativa    Hypertension    IBS (irritable bowel syndrome)    Low back pain    Migraines    Morbidly obese    Neuromuscular disorder    fibromyalgia   Neuropathy    Panic attacks    Polyp of vocal cord or larynx    Tonsil pain     Current Outpatient Medications on File Prior to Visit  Medication Sig Dispense Refill   ACCU-CHEK GUIDE test strip USE ONE TEST STRIP TO CHECK BLOOD SUGARS ONCE A DAY AS NEEDED 100 strip 1   Accu-Chek Softclix Lancets lancets USE ONE LANCET TO CHECK BLOOD SUGAR ONE A DAY AS NEEDED 100 each 1   ADDYI 100 MG TABS TAKE 1 TABLET BY MOUTH EVERY DAY 30 tablet 5   albuterol (PROVENTIL) (2.5 MG/3ML) 0.083% nebulizer solution TAKE 3 ML (2.5 MG TOTAL) BY NEBULIZATION EVERY 4 HOURS AS NEEDED FOR WHEEZING OR SHORTNESS OF BREATH 150 mL 0   albuterol (VENTOLIN HFA) 108 (90 Base) MCG/ACT inhaler TAKE 2 PUFFS BY MOUTH EVERY 6 HOURS AS NEEDED FOR WHEEZE OR SHORTNESS OF BREATH 8.5 each 0   Ascorbic Acid (VITAMIN C PO) Take 1 tablet by mouth daily.     atorvastatin (LIPITOR) 40 MG tablet TAKE 1 TABLET BY MOUTH EVERY DAY 90 tablet 3   azelastine (ASTELIN) 0.1 % nasal spray Place 1 spray into both nostrils 2 (two) times daily. Use in each nostril as directed 30 mL 12   benzonatate (TESSALON PERLES) 100 MG capsule Take 1 capsule (100 mg total) by mouth every 6 (six) hours as needed for cough. 30 capsule 1   Blood Glucose Monitoring Suppl  (ACCU-CHEK GUIDE ME) w/Device KIT Dispense one device 1 kit 0   Brexpiprazole (REXULTI) 3 MG TABS Take 1 tablet (3 mg total) by mouth daily. 90 tablet 0   Capsaicin 0.025 % PTCH Apply 1 patch topically daily as needed. 10 patch 9   cetirizine (ZYRTEC) 10 MG tablet Take 1 tablet (10 mg total) by mouth daily. 90 tablet 1   clindamycin (CLINDAGEL) 1 % gel APPLY TO AFFECTED AREA TWICE A DAY 30 g 0   [facility] D3 25 MCG (1000 UT) capsule TAKE 1 CAPSULE BY MOUTH EVERY DAY 90 capsule 1   diclofenac Sodium (VOLTAREN) 1 % GEL APPLY 2 GRAMS TOPICALLY 4 (FOUR) TIMES DAILY AS NEEDED FOR KNEE PAIN 400 g 1   doxepin (SINEQUAN) 10 MG capsule Take 1 capsule (10 mg total) daily at bedtime. 30 capsule 1   doxycycline (VIBRAMYCIN) 100 MG capsule Take 1 capsule (100 mg total) by mouth 2 (two) times daily. 14 capsule 0   ENTRESTO 49-51 MG Take 1 tablet by mouth 2 (two) times daily.     famotidine (PEPCID) 20 MG tablet TAKE 1 TABLET BY MOUTH EVERY DAY 90 tablet 1  ferrous sulfate 325 (65 FE) MG EC tablet Take 1 tablet (325 mg total) by mouth daily with breakfast. 90 tablet 1   fluticasone (FLONASE) 50 MCG/ACT nasal spray SPRAY 1 SPRAY INTO BOTH NOSTRILS DAILY. 16 mL 2   ipratropium (ATROVENT) 0.06 % nasal spray PLEASE SEE ATTACHED FOR DETAILED DIRECTIONS     lamoTRIgine (LAMICTAL) 150 MG tablet Take 1 tablet (150 mg total) by mouth daily. 90 tablet 0   lidocaine (LIDODERM) 5 % PLACE 2 PATCHES ONTO THE SKIN DAILY. REMOVE & DISCARD PATCH WITHIN 12 HOURS OR AS DIRECTED BY MD 60 patch 2   meclizine (ANTIVERT) 25 MG tablet TAKE 1 TABLET BY MOUTH THREE TIMES A DAY AS NEEDED FOR DIZZINESS 90 tablet 2   megestrol (MEGACE) 40 MG tablet Take 2 tablets (80 mg total) by mouth daily. (Patient not taking: Reported on 04/10/2022) 180 tablet 1   metoprolol succinate (TOPROL-XL) 50 MG 24 hr tablet Take 1 tablet (50 mg total) by mouth daily. 90 tablet 3   omeprazole (PRILOSEC) 40 MG capsule TAKE 1 CAPSULE (40 MG TOTAL) BY MOUTH DAILY. 90  capsule 3   ondansetron (ZOFRAN-ODT) 8 MG disintegrating tablet TAKE 1 TABLET BY MOUTH EVERY 8 HOURS AS NEEDED FOR NAUSEA OR VOMITING. 30 tablet 1   Semaglutide, 1 MG/DOSE, (OZEMPIC, 1 MG/DOSE,) 4 MG/3ML SOPN INJECT 1 MG INTO THE SKIN ONE TIME PER WEEK 3 mL 2   Semaglutide, 2 MG/DOSE, (OZEMPIC, 2 MG/DOSE,) 8 MG/3ML SOPN Inject 2 mg into the skin once a week. 3 mL 3   SYMBICORT 160-4.5 MCG/ACT inhaler INHALE 2 PUFFS INTO THE LUNGS IN THE MORNING AND AT BEDTIME. 10.2 each 0   torsemide (DEMADEX) 20 MG tablet TAKE 2 TABLETS BY MOUTH TWICE A DAY 360 tablet 1   traMADol (ULTRAM) 50 MG tablet TAKE 1 TABLET BY MOUTH EVERY 4 HOURS AS NEEDED. 180 tablet 0   vitamin B-12 (CYANOCOBALAMIN) 1000 MCG tablet Take 1 tablet (1,000 mcg total) by mouth daily. 30 tablet 3   Vitamin E 400 units TABS Take 400 Units by mouth daily.      No current facility-administered medications on file prior to visit.    Family History  Problem Relation Age of Onset   Diabetes Mother    Heart disease Mother        valve leak   Anxiety disorder Mother    Depression Mother    High blood pressure Mother    Kidney failure Mother    Cancer Father        prostate   Heart disease Father    Learning disabilities Sister    Depression Sister    Depression Sister    Anxiety disorder Sister    Colon cancer Neg Hx     Social History   Socioeconomic History   Marital status: Single    Spouse name: Not on file   Number of children: 1   Years of education: 11   Highest education level: Not on file  Occupational History   Occupation: Disabled  Tobacco Use   Smoking status: Former    Packs/day: 0.10    Years: 25.00    Additional pack years: 0.00    Total pack years: 2.50    Types: Cigarettes    Quit date: 05/24/2019    Years since quitting: 2.9   Smokeless tobacco: Never   Tobacco comments:    quit in April.  Vaping Use   Vaping Use: Never used  Substance and Sexual Activity  Alcohol use: Not Currently     Alcohol/week: 0.0 standard drinks of alcohol    Comment: none sine 05/2018   Drug use: Not Currently    Frequency: 3.0 times per week    Types: Marijuana   Sexual activity: Yes    Birth control/protection: Implant  Other Topics Concern   Not on file  Social History Narrative   Lives at home w/ her mother and daughter   Right-handed   Caffeine: about 3 Cokes per week   Social Determinants of Health   Financial Resource Strain: High Risk (03/18/2022)   Overall Financial Resource Strain (CARDIA)    Difficulty of Paying Living Expenses: Hard  Food Insecurity: Food Insecurity Present (03/18/2022)   Hunger Vital Sign    Worried About Running Out of Food in the Last Year: Often true    Ran Out of Food in the Last Year: Sometimes true  Transportation Needs: Unmet Transportation Needs (03/18/2022)   PRAPARE - Transportation    Lack of Transportation (Medical): Yes    Lack of Transportation (Non-Medical): Yes  Physical Activity: Insufficiently Active (08/14/2021)   Exercise Vital Sign    Days of Exercise per Week: 1 day    Minutes of Exercise per Session: 40 min  Stress: No Stress Concern Present (08/14/2021)   [place]    Feeling of Stress : Only a little  Social Connections: Moderately Isolated (03/18/2022)   Social Connection and Isolation Panel [NHANES]    Frequency of Communication with Friends and Family: More than three times a week    Frequency of Social Gatherings with Friends and Family: Never    Attends Religious Services: 1 to 4 times per year    Active Member of [employer] or Organizations: No    Attends Archivist Meetings: Never    Marital Status: Never married  Intimate Partner Violence: Not At Risk (03/18/2022)   Humiliation, Afraid, Rape, and Kick questionnaire    Fear of Current or Ex-Partner: No    Emotionally Abused: No    Physically Abused: No    Sexually Abused: No    Review of  Systems: ROS negative except for what is noted on the assessment and plan.  Vitals:   05/02/22 1000  BP: 95/64  Pulse: (!) 108  Temp: 98.8 F (37.1 C)  TempSrc: Oral  SpO2: 100%  Weight: (!) 340 lb (154.2 kg)  Height: 5\' 11"  (1.803 m)    Physical Exam: Constitutional: obese female sitting in chair, NAD Cardiovascular: regular rate and rhythm, no m/r/g Pulmonary/Chest: normal work of breathing on room air Abdominal: soft, non-distended, tenderness to palpation in lower abd quadrants Neurological: alert & oriented x 3, no focal deficit Skin: warm and dry Psych: tearful   Assessment & Plan:    Patient discussed with Dr. Daryll Drown  Fibroid, uterine The patient has a history of chronic pain (back pain, knee pain) and fibromyalgia.  She is on chronic tramadol 50 mg every 4 to 6 hours, Tylenol, and tizanidine.  In addition to her tramadol, she has gotten short courses of Percocet/oxycodone for acute pain in the past.  The patient presents today for her chronic pain, in addition to acute pelvic pain.  She has been to the ED twice recently for this pain, most recently on 4/1.  She was diagnosed with a small uterine fibroid and was given a shot of Toradol in the ED.  She states that Toradol did not help her pain, and  the only thing that helped her pain was a short course of oxycodone that she got on 3/29 from the [facility].  She is specifically requesting another course of oxycodone to get her through until her OB/GYN appointment on Monday, 4/8.  We discussed, at length, that there is not an indication to prescribe oxycodone for her pain at this time.  The patient is very distressed by this, and is tearful throughout the encounter, stating that we never help her pain.  As far as her pelvic pain/fibroids, the patient states that this has been ongoing for the last week.  She has not had a menstrual cycle in 2 years, and notes that she has been bleeding for the past 4 days.  She states that she is going  through 10 pads a day.  We discussed the importance of going to her OB/GYN appointment, and she agrees.  She is very distressed that she cannot get another course of oxycodone to help her pain until that appointment.  I offered her another short of Toradol, which she agreed to, although she stated that it will not help her pain.  She is also asking for a back and knee injection today.  I discussed with her that we do not do back injections at the [facility], and that we can do a knee injection in the future, but that we will start with Toradol today.  Her last knee injection was December.  Plan: - Toradol 30 mg today - Refilled chronic tramadol and tizanidine - OB/GYN appointment on Monday, 4/8   Pain in both knees The patient has a history of chronic pain in both of her knees.  She has had knee injections in the past, and is requesting to have one today.  Patient got a dose of Toradol 30 mg intramuscularly for her pelvic pain, and I advised her that this should also help her knee pain.  If her knee pain is not improved, she can come back to the [facility] in 1 to 2 weeks for a knee injection.   Buddy Duty, D.O. [place] [facility], PGY-2 Phone: [phone] Date 05/02/2022 Time 11:40 AM

## 2022-05-01 NOTE — Telephone Encounter (Signed)
Returned patient's call told therapist found out she has fibroid tumors lot of pain.  Therapist said would research it but related for patient to follow-up with medical providers recommendation was for her to get a hold of OB/GYN therapist encouraged her with that as well as primary care.  Therapist will research further have better idea feedback for patient will talk to her this Friday when we have an appointment

## 2022-05-01 NOTE — Telephone Encounter (Signed)
Therapist returned call see note in chart

## 2022-05-01 NOTE — Telephone Encounter (Signed)
Pt agrees. Pt questioned d/c Tranxene but this nurse reiterated it would have o be discontinued if Doxepin 10 mg started. Pt reluctantly agreed. She will let us know of any issues, questions, or complaints.

## 2022-05-01 NOTE — Telephone Encounter (Signed)
I can try low-dose doxepin 10 mg at bedtime but she need to stop the Tranxene.  If agree please call pharmacy doxepin 10 mg at bedtime.  She also need to contact her sleep doctor as she may need adjustment on her CPAP machine.  We will discontinue mirtazapine and amitriptyline since patient is no longer taking it.

## 2022-05-02 ENCOUNTER — Other Ambulatory Visit: Payer: Self-pay | Admitting: Internal Medicine

## 2022-05-02 ENCOUNTER — Ambulatory Visit (INDEPENDENT_AMBULATORY_CARE_PROVIDER_SITE_OTHER): Payer: 59 | Admitting: Internal Medicine

## 2022-05-02 ENCOUNTER — Other Ambulatory Visit: Payer: Self-pay | Admitting: Pulmonary Disease

## 2022-05-02 ENCOUNTER — Encounter: Payer: Self-pay | Admitting: Internal Medicine

## 2022-05-02 ENCOUNTER — Other Ambulatory Visit: Payer: Self-pay

## 2022-05-02 VITALS — BP 95/64 | HR 108 | Temp 98.8°F | Ht 71.0 in | Wt 340.0 lb

## 2022-05-02 DIAGNOSIS — D259 Leiomyoma of uterus, unspecified: Secondary | ICD-10-CM | POA: Diagnosis not present

## 2022-05-02 DIAGNOSIS — M797 Fibromyalgia: Secondary | ICD-10-CM

## 2022-05-02 DIAGNOSIS — M25561 Pain in right knee: Secondary | ICD-10-CM | POA: Diagnosis not present

## 2022-05-02 DIAGNOSIS — R059 Cough, unspecified: Secondary | ICD-10-CM

## 2022-05-02 DIAGNOSIS — M25562 Pain in left knee: Secondary | ICD-10-CM

## 2022-05-02 DIAGNOSIS — G8929 Other chronic pain: Secondary | ICD-10-CM | POA: Diagnosis not present

## 2022-05-02 MED ORDER — KETOROLAC TROMETHAMINE 30 MG/ML IJ SOLN
30.0000 mg | Freq: Once | INTRAMUSCULAR | Status: AC
  Administered 2022-05-02: 30 mg via INTRAMUSCULAR

## 2022-05-02 MED ORDER — TIZANIDINE HCL 4 MG PO TABS: 4.0000 mg | ORAL_TABLET | Freq: Three times a day (TID) | ORAL | 0 refills | Status: DC | PRN

## 2022-05-02 NOTE — Assessment & Plan Note (Addendum)
The patient has a history of chronic pain (back pain, knee pain) and fibromyalgia.  She is on chronic tramadol 50 mg every 4 to 6 hours, Tylenol, and tizanidine.  In addition to her tramadol, she has gotten short courses of Percocet/oxycodone for acute pain in the past.  The patient presents today for her chronic pain, in addition to acute pelvic pain.  She has been to the ED twice recently for this pain, most recently on 4/1.  She was diagnosed with a small uterine fibroid and was given a shot of Toradol in the ED.  She states that Toradol did not help her pain, and the only thing that helped her pain was a short course of oxycodone that she got on 3/29 from the [facility].  She is specifically requesting another course of oxycodone to get her through until her OB/GYN appointment on Monday, 4/8.  We discussed, at length, that there is not an indication to prescribe oxycodone for her pain at this time.  The patient is very distressed by this, and is tearful throughout the encounter, stating that we never help her pain.  As far as her pelvic pain/fibroids, the patient states that this has been ongoing for the last week.  She has not had a menstrual cycle in 2 years, and notes that she has been bleeding for the past 4 days.  She states that she is going through 10 pads a day.  We discussed the importance of going to her OB/GYN appointment, and she agrees.  She is very distressed that she cannot get another course of oxycodone to help her pain until that appointment.  I offered her another short of Toradol, which she agreed to, although she stated that it will not help her pain.  She is also asking for a back and knee injection today.  I discussed with her that we do not do back injections at the [facility], and that we can do a knee injection in the future, but that we will start with Toradol today.  Her last knee injection was December.  Plan: - Toradol 30 mg today - Refilled chronic tramadol and tizanidine - OB/GYN  appointment on Monday, 4/8

## 2022-05-02 NOTE — Assessment & Plan Note (Signed)
The patient has a history of chronic pain in both of her knees.  She has had knee injections in the past, and is requesting to have one today.  Patient got a dose of Toradol 30 mg intramuscularly for her pelvic pain, and I advised her that this should also help her knee pain.  If her knee pain is not improved, she can come back to the [facility] in 1 to 2 weeks for a knee injection.

## 2022-05-02 NOTE — Patient Instructions (Signed)
Thank you, Ms.[name] for allowing [name] to provide your care today. Today we discussed:  Pain: We gave you a dose of toradol which should help your pain I have refilled your tramadol and tizanidine; you can also keep taking tylenol 1000 mg three times a day If you still have knee pain, you can come back in the next week or two for a knee injection Follow up with OBGYN on Monday for the bleeding/pelvic pain  I have ordered the following labs for you:  Lab Orders  No laboratory test(s) ordered today      Referrals ordered today:   Referral Orders  No referral(s) requested today     I have ordered the following medication/changed the following medications:   Stop the following medications: There are no discontinued medications.   Start the following medications: No orders of the defined types were placed in this encounter.    Follow up:  if pain worsens or fails to improve     Should you have any questions or concerns please call the internal medicine clinic at [phone].     Buddy Duty, D.O. [place]

## 2022-05-03 ENCOUNTER — Ambulatory Visit (INDEPENDENT_AMBULATORY_CARE_PROVIDER_SITE_OTHER): Payer: 59 | Admitting: Licensed Clinical Social Worker

## 2022-05-03 DIAGNOSIS — F331 Major depressive disorder, recurrent, moderate: Secondary | ICD-10-CM | POA: Diagnosis not present

## 2022-05-03 DIAGNOSIS — F411 Generalized anxiety disorder: Secondary | ICD-10-CM

## 2022-05-03 DIAGNOSIS — F410 Panic disorder [episodic paroxysmal anxiety] without agoraphobia: Secondary | ICD-10-CM

## 2022-05-03 DIAGNOSIS — F4312 Post-traumatic stress disorder, chronic: Secondary | ICD-10-CM

## 2022-05-03 NOTE — Progress Notes (Signed)
Virtual Visit via Video Note  I connected with Lindsay [name] on 05/03/22 at  8:00 AM EDT by a video enabled telemedicine application and verified that I am speaking with the correct person using two identifiers.  Location: Patient: home Provider: home office   I discussed the limitations of evaluation and management by telemedicine and the availability of in person appointments. The patient expressed understanding and agreed to proceed.   I discussed the assessment and treatment plan with the patient. The patient was provided an opportunity to ask questions and all were answered. The patient agreed with the plan and demonstrated an understanding of the instructions.   The patient was advised to call back or seek an in-person evaluation if the symptoms worsen or if the condition fails to improve as anticipated.  I provided 45 minutes of non-face-to-face time during this encounter.  THERAPIST PROGRESS NOTE  Session Time: 8:00 AM to 8:45 AM  Participation Level: Active  Behavioral Response: CasualAlertAngry, Dysphoric, and Irritable  Type of Therapy: Individual Therapy  Treatment Goals addressed: patient continue to utilize therapy for  supportive and strength-based interventions, provide treatment interventions in the context of patient continuing to seek help and cope with medical issues, anxiety, triggers, coping with mental health symptoms, utilize therapy as a way for patient to focus on working through current stressors that also helps to distract from pain  ProgressTowards Goals: Progressing-patient verbalizes and therapist assesses helps to get support from therapy from somebody she knows attempted to use reframing discussing her diagnosis and there is treatment for it patient though in a lot of pain difficult for it to affect mood related to patient has plan in place to address issues which is positive feel helps for patient to verbalize frustrations in session to help  cope  Interventions: Solution Focused, Strength-based, Supportive, and Reframing  Summary: Lindsay [name] is a 52 y.o. female who presents with in a lot of pain.  Therapist related she looked up fibroid tumors when heard from patient the other day that there is treatment that there is not cancerous treatment will depend on patient's goals and the size and location of the fibroids. Patient said looked up a long time ago and looked up the other day. Knows what it is. Has pelvic pain. Sat and cried at the internal medicine at [name] the lady looked at her and said can't do anything for you. Primary tells her to come to the office she is not there and they "act as if they are dumb". On Good Friday called the on call doctor and he called in pain medicine. It did help. Just needs enough for Monday that is what asked for. Also had questions asked. Blood work abnormal and asked what does it mean?Lindsay [name] it was fine and it is not patient said. She was at emergency room on April 1 found out fibroid tumors went on 26th and the 1st they explained why has pain going over to stomach patient has diverticulitis as well patient shared how she feels more stuff is adding on. Happening and nobody doing anything about that. Has to go to GI for diverticulitis.  Even though not recommended to go. Go to [name] going stomach is hurting a lot. Has appointment for Obgyn on Monday. Asked PCP for something stronger than Tramadol over the weekend. On call gave it to her. PCP said to come over then came over then didn't seem to know medical history. Told her to take tramadol and muscle relaxant  also it bleeding. Even [name] offered benzo has to come off the Tranxene if prescribed Valium and Xanax. That is the main pill for her panic patient didn't want that..  Therapist labeled patient's feelings and understanding see one thing on top of the other. Therapist noted thought had more positive news that would make a difference  fibroid tumors were not cancer. Access for patient not a relief in a lot of pain and medication giving not helping. Hard to sit down and will call the on call. Has been up 2-3 days. Have a treatment plan to do for this therapy patient says to do when feels better. On a muscle relaxant another thing recommended for pain. PCP said back injections to help her and asked yesterday and they said on. Patient says they can't do in office but contact lady to make appointments to get back injections like did one time before. Her doctor said can have them don't want to go to office when not there. Has an appointment May 1 with PCP. Getting drowsy. Aid just came. Also has to do a mammogram. Goes to internal medicine and expressed her frustration as well that these trips messing up her transportation. Discouraged Lindsay [name] going to take her to Obgyn on Monday but when pick up kids. Thinks they may think seeking pain pills doctor gave her five pain pills not a lot and it worked. Therapist said need to do what works and doesn't harm her. Dr. Lolly MustacheArfeen took her off Traxene when patient said need something to sleep. Doxipan prescribed her.therapist noted thought would be better conversation not as bad as thought but could patient still frustrated with medical providers and another diagnosis.  Therapist provided support and space for patient to talk about thoughts and feelings in session     Suicidal/Homicidal: No  Plan: Return again in 2 weeks.2.  Assessed very helpful for patient to get supportive interventions processed thoughts and feelings in session  Diagnosis: Major depressive disorder, recurrent, moderate, generalized anxiety disorder, chronic PTSD, panic attacks  Collaboration of Care: Other none needed  Patient/Guardian was advised Release of Information must be obtained prior to any record release in order to collaborate their care with an outside provider. Patient/Guardian was advised if they have not already done  so to contact the registration department to sign all necessary forms in order for us to release information regarding their care.   Consent: Patient/Guardian gives verbal consent for treatment and assignment of benefits for services provided during this visit. Patient/Guardian expressed understanding and agreed to proceed.   Coolidge BreezeMary Makailyn Mccormick, LCSW 05/03/2022

## 2022-05-04 ENCOUNTER — Other Ambulatory Visit: Payer: Self-pay | Admitting: Student

## 2022-05-04 ENCOUNTER — Telehealth: Payer: Self-pay | Admitting: Student

## 2022-05-04 NOTE — Telephone Encounter (Deleted)
Received after hours page. Patient still having significant pain from fibroids. Requesting pain medications. Is taking tramadol 50 mg every 4 hours tizanidine 4 mg daily and tylenol 650 mg every 6 hours. Has GYN follow up on Monday 4/8. Avoid

## 2022-05-04 NOTE — Telephone Encounter (Signed)
Received after hours page. Patient still having significant pain from fibroids. Flet toradol injection in clinic on 4/4 was not effective. Requesting pain medications. Is taking tramadol 50 mg every 4 hours tizanidine 4 mg daily and tylenol 650 mg every 6 hours. No worsening bleeding from fibroids or new symptoms. Has GYN follow up on Monday 4/8. Discussed a short course of opioids would not be appropriate. Will have her alternate her tramadol and tylenol every 4 hours for pain and continue with muscle relaxer. Emphasized importance of follow up with gyn for fibroids.

## 2022-05-04 NOTE — Progress Notes (Signed)
Received page on on-call pager at [phone]. Called patient x2, but no voicemail set up to leave message.  Merrilyn Puma, MD [facility], PGY-3 05/04/2022, 9:38 AM

## 2022-05-06 ENCOUNTER — Encounter: Payer: 59 | Admitting: Family Medicine

## 2022-05-06 NOTE — Progress Notes (Signed)
Internal Medicine Clinic Attending ° °Case discussed with Dr. Atway  at the time of the visit.  We reviewed the resident’s history and exam and pertinent patient test results.  I agree with the assessment, diagnosis, and plan of care documented in the resident’s note.  °

## 2022-05-07 ENCOUNTER — Other Ambulatory Visit: Payer: Self-pay | Admitting: Internal Medicine

## 2022-05-07 DIAGNOSIS — A084 Viral intestinal infection, unspecified: Secondary | ICD-10-CM

## 2022-05-07 NOTE — Telephone Encounter (Signed)
Next appt scheduled  5/1 with PCP. 

## 2022-05-08 ENCOUNTER — Encounter: Payer: 59 | Admitting: Internal Medicine

## 2022-05-09 ENCOUNTER — Telehealth: Payer: Self-pay

## 2022-05-09 NOTE — Telephone Encounter (Signed)
Returned call to June, PT with [facility]. Requesting VO to resume HH PT 2 week 3 to work on strengthening, balance training, and home exercise program. SOC to begin 4/22 as patient is having pelvic pain 2/2 fibroids. Verbal auth given. Will route to PCP for agreement/denial.

## 2022-05-09 NOTE — Telephone Encounter (Signed)
Physical  therapist is requesting a call back from interim  home health  for verbal  order    Name : June   Telel :[phone]

## 2022-05-09 NOTE — Telephone Encounter (Signed)
Agree, thank you

## 2022-05-11 ENCOUNTER — Telehealth: Payer: Self-pay | Admitting: Internal Medicine

## 2022-05-11 NOTE — Telephone Encounter (Signed)
Contacted patient after she called the after-hours line. She continues to have pain in her knees and abdomen despite toradol, and was wondering if she could take ibuprofen. I advised that her kidney function was okay and that she could try taking ibuprofen to see if she had any improvement of her symptoms. The only pain medication she is taking right now is tramadol. I advised that she present to the ED for further evaluation if her pain continues to worsen despite these therapies, or call back.  Champ Mungo, DO

## 2022-05-13 ENCOUNTER — Other Ambulatory Visit: Payer: Self-pay | Admitting: Internal Medicine

## 2022-05-13 DIAGNOSIS — E1121 Type 2 diabetes mellitus with diabetic nephropathy: Secondary | ICD-10-CM

## 2022-05-13 DIAGNOSIS — G8929 Other chronic pain: Secondary | ICD-10-CM

## 2022-05-13 NOTE — Telephone Encounter (Signed)
Patient asked about taking Ibuprofen or Naproxsyn. Voiced understanding  about the Robaxin.

## 2022-05-13 NOTE — Telephone Encounter (Signed)
Refill for robaxin declined; discussed at a prior visit she needs to pick either tizanidine or robaxin, but we cannot continue to fill both. Robaxin was previously discontinued. Approved ozempic.

## 2022-05-15 ENCOUNTER — Telehealth: Payer: Self-pay | Admitting: *Deleted

## 2022-05-15 NOTE — Telephone Encounter (Signed)
Left VM on patient's mobile to check on her well-being as it doesn't appear that she's registered with an ED yet.  I'll call her daughter listed in her contacts.

## 2022-05-15 NOTE — Telephone Encounter (Signed)
Spoke to daughter Estill Bakes by phone who was aware of her mother's symptoms and the advice to go to ED.  She is going to reach out both to her mom and her grandmother to f/u.

## 2022-05-15 NOTE — Telephone Encounter (Signed)
Patient called in stating she took her meds this AM at 0545. Within minutes her lips began swelling and left side of face was drooping. She also had slurred speech at that time that has since improved. Denies balance issues, loss of vision, arm numbness or weakness. She has a hx of prior CVAs that she was unaware of until PCP told her following MRI in June 2022. She is advised to call 911 right now and be sure to tell them of hx of prior strokes. States she will call them now.

## 2022-05-16 ENCOUNTER — Other Ambulatory Visit: Payer: Self-pay | Admitting: Pulmonary Disease

## 2022-05-16 ENCOUNTER — Emergency Department (HOSPITAL_COMMUNITY)
Admission: EM | Admit: 2022-05-16 | Discharge: 2022-05-16 | Disposition: A | Discharge status: Home / Self Care | Payer: 59 | Attending: Emergency Medicine | Admitting: Emergency Medicine

## 2022-05-16 ENCOUNTER — Emergency Department (HOSPITAL_COMMUNITY): Payer: 59

## 2022-05-16 ENCOUNTER — Ambulatory Visit: Payer: 59 | Admitting: Obstetrics and Gynecology

## 2022-05-16 ENCOUNTER — Encounter (HOSPITAL_COMMUNITY): Payer: Self-pay

## 2022-05-16 DIAGNOSIS — R079 Chest pain, unspecified: Secondary | ICD-10-CM | POA: Insufficient documentation

## 2022-05-16 DIAGNOSIS — Z79899 Other long term (current) drug therapy: Secondary | ICD-10-CM | POA: Diagnosis not present

## 2022-05-16 DIAGNOSIS — I509 Heart failure, unspecified: Secondary | ICD-10-CM | POA: Diagnosis not present

## 2022-05-16 DIAGNOSIS — N189 Chronic kidney disease, unspecified: Secondary | ICD-10-CM | POA: Insufficient documentation

## 2022-05-16 DIAGNOSIS — R200 Anesthesia of skin: Secondary | ICD-10-CM | POA: Insufficient documentation

## 2022-05-16 DIAGNOSIS — I130 Hypertensive heart and chronic kidney disease with heart failure and stage 1 through stage 4 chronic kidney disease, or unspecified chronic kidney disease: Secondary | ICD-10-CM | POA: Diagnosis not present

## 2022-05-16 DIAGNOSIS — R220 Localized swelling, mass and lump, head: Secondary | ICD-10-CM | POA: Insufficient documentation

## 2022-05-16 HISTORY — DX: Cerebral infarction, unspecified: I63.9

## 2022-05-16 LAB — CBC WITH DIFFERENTIAL/PLATELET
Abs Immature Granulocytes: 0.03 10*3/uL (ref 0.00–0.07)
Basophils Absolute: 0 10*3/uL (ref 0.0–0.1)
Basophils Relative: 0 %
Eosinophils Absolute: 0 10*3/uL (ref 0.0–0.5)
Eosinophils Relative: 0 %
HCT: 36.4 % (ref 36.0–46.0)
Hemoglobin: 11.6 g/dL — ABNORMAL LOW (ref 12.0–15.0)
Immature Granulocytes: 0 %
Lymphocytes Relative: 24 %
Lymphs Abs: 2.4 10*3/uL (ref 0.7–4.0)
MCH: 27.1 pg (ref 26.0–34.0)
MCHC: 31.9 g/dL (ref 30.0–36.0)
MCV: 85 fL (ref 80.0–100.0)
Monocytes Absolute: 0.8 10*3/uL (ref 0.1–1.0)
Monocytes Relative: 9 %
Neutro Abs: 6.5 10*3/uL (ref 1.7–7.7)
Neutrophils Relative %: 67 %
Platelets: 379 10*3/uL (ref 150–400)
RBC: 4.28 MIL/uL (ref 3.87–5.11)
RDW: 16.3 % — ABNORMAL HIGH (ref 11.5–15.5)
WBC: 9.8 10*3/uL (ref 4.0–10.5)
nRBC: 0 % (ref 0.0–0.2)

## 2022-05-16 LAB — CBG MONITORING, ED: Glucose-Capillary: 84 mg/dL (ref 70–99)

## 2022-05-16 LAB — TROPONIN I (HIGH SENSITIVITY)
Troponin I (High Sensitivity): 4 ng/L (ref <18)
Troponin I (High Sensitivity): 4 ng/L (ref <18)

## 2022-05-16 LAB — I-STAT CHEM 8, ED
BUN: 9 mg/dL (ref 6–20)
Calcium, Ion: 1.14 mmol/L — ABNORMAL LOW (ref 1.15–1.40)
Chloride: 114 mmol/L — ABNORMAL HIGH (ref 98–111)
Creatinine, Ser: 0.8 mg/dL (ref 0.44–1.00)
Glucose, Bld: 88 mg/dL (ref 70–99)
HCT: 36 % (ref 36.0–46.0)
Hemoglobin: 12.2 g/dL (ref 12.0–15.0)
Potassium: 3.6 mmol/L (ref 3.5–5.1)
Sodium: 143 mmol/L (ref 135–145)
TCO2: 20 mmol/L — ABNORMAL LOW (ref 22–32)

## 2022-05-16 LAB — BRAIN NATRIURETIC PEPTIDE: B Natriuretic Peptide: 30.7 pg/mL (ref 0.0–100.0)

## 2022-05-16 LAB — I-STAT BETA HCG BLOOD, ED (MC, WL, AP ONLY): I-stat hCG, quantitative: <5 m[IU]/mL (ref <5)

## 2022-05-16 LAB — COMPREHENSIVE METABOLIC PANEL
ALT: 13 U/L (ref 0–44)
AST: 12 U/L — ABNORMAL LOW (ref 15–41)
Albumin: 3.7 g/dL (ref 3.5–5.0)
Alkaline Phosphatase: 68 U/L (ref 38–126)
Anion gap: 10 (ref 5–15)
BUN: 9 mg/dL (ref 6–20)
CO2: 19 mmol/L — ABNORMAL LOW (ref 22–32)
Calcium: 9.1 mg/dL (ref 8.9–10.3)
Chloride: 112 mmol/L — ABNORMAL HIGH (ref 98–111)
Creatinine, Ser: 0.92 mg/dL (ref 0.44–1.00)
GFR, Estimated: >60 mL/min (ref >60)
Glucose, Bld: 90 mg/dL (ref 70–99)
Potassium: 3.6 mmol/L (ref 3.5–5.1)
Sodium: 141 mmol/L (ref 135–145)
Total Bilirubin: 0.9 mg/dL (ref 0.3–1.2)
Total Protein: 7 g/dL (ref 6.5–8.1)

## 2022-05-16 LAB — PROTIME-INR
INR: 1.1 (ref 0.8–1.2)
Prothrombin Time: 13.8 seconds (ref 11.4–15.2)

## 2022-05-16 MED ORDER — PREDNISONE 10 MG PO TABS: 20.0000 mg | ORAL_TABLET | Freq: Every day | ORAL | 0 refills | Status: AC

## 2022-05-16 MED ORDER — LORAZEPAM 2 MG/ML IJ SOLN
1.0000 mg | Freq: Once | INTRAMUSCULAR | Status: DC
  Filled 2022-05-16: qty 1

## 2022-05-16 MED ORDER — LORAZEPAM 2 MG/ML IJ SOLN
0.5000 mg | Freq: Once | INTRAMUSCULAR | Status: AC
  Administered 2022-05-16: 0.5 mg via INTRAVENOUS

## 2022-05-16 MED ORDER — METHYLPREDNISOLONE SODIUM SUCC 125 MG IJ SOLR
125.0000 mg | Freq: Once | INTRAMUSCULAR | Status: AC
  Administered 2022-05-16: 125 mg via INTRAVENOUS
  Filled 2022-05-16: qty 2

## 2022-05-16 MED ORDER — IOHEXOL 350 MG/ML SOLN
120.0000 mL | Freq: Once | INTRAVENOUS | Status: AC | PRN
  Administered 2022-05-16: 120 mL via INTRAVENOUS

## 2022-05-16 MED ORDER — HYDROCODONE-ACETAMINOPHEN 5-325 MG PO TABS
1.0000 | ORAL_TABLET | Freq: Once | ORAL | Status: AC
  Administered 2022-05-16: 1 via ORAL
  Filled 2022-05-16: qty 1

## 2022-05-16 NOTE — ED Provider Notes (Signed)
[place] EMERGENCY DEPARTMENT AT [facility] Provider Note   CSN: [id number] Arrival date & time: 05/16/22  3557     History  Chief Complaint  Patient presents with   Neurologic Problem    Lindsay [name] is a 52 y.o. female.  HPI     53 year old female with a history of CHF, COPD/OSA/OHS on chronic oxygen 3 L, hypertension, CVA, who presents with concern for lower lip swelling and left-sided numbness.  Reports she woke up yesterday morning, took her medications and at 6:30 AM noticed that she had lip swelling, chest pain, feeling that both sides of her face were drooping and it was twisted on the left, left arm numbness and tingling.  She did feel like there was a numbness and weakness.  Reports that it had improved, but the swelling had stayed.  This morning when she woke up she had the numbness of her left arm and leg again.  She felt that her lip swelling was still there.  She has chronic left sided visual problems.  She also reports sharp central chest pain.  Dyspnea worse with exertion.  The chest pain does not radiate.  She also has pain in her left leg, around her left knee.  Has nausea, diaphoresis, lightheadedness.  Does not feel that she has tongue swelling, throat swelling, or dyspnea from her airway--just feels more chest symptoms.   Has not started or stopped any new medications recently. Deneis etoh/benzo use, smoking, other drugs.  Feels shaky like tongue and left leg with tremors.    Past Medical History:  Diagnosis Date   Acid reflux    Anemia    Iron Def   Anorexia    CHF (congestive heart failure)    Chronic kidney disease    Nephrotic syndrome   Colon polyp 2009   Depression with anxiety    Edema leg    Fibromyalgia    Hemorrhoids    Hidradenitis suppurativa    Hypertension    IBS (irritable bowel syndrome)    Low back pain    Migraines    Morbidly obese    Neuromuscular disorder    fibromyalgia   Neuropathy    Panic attacks    Polyp  of vocal cord or larynx    Stroke    Tonsil pain      Home Medications Prior to Admission medications   Medication Sig Start Date End Date Taking? Authorizing Provider  predniSONE (DELTASONE) 10 MG tablet Take 2 tablets (20 mg total) by mouth daily for 3 days. 05/16/22 05/19/22 Yes Alvira Monday, MD  ACCU-CHEK GUIDE test strip USE ONE TEST STRIP TO CHECK BLOOD SUGARS ONCE A DAY AS NEEDED 11/06/21   [name], MD  Accu-Chek Softclix Lancets lancets USE ONE LANCET TO CHECK BLOOD SUGAR ONE A DAY AS NEEDED 02/04/22   [name], MD  ADDYI 100 MG TABS TAKE 1 TABLET BY MOUTH EVERY DAY 11/03/20   Reva Bores, MD  albuterol (PROVENTIL) (2.5 MG/3ML) 0.083% nebulizer solution TAKE 3 ML (2.5 MG TOTAL) BY NEBULIZATION EVERY 4 HOURS AS NEEDED FOR WHEEZING OR SHORTNESS OF BREATH 07/11/21   Olalere, Adewale A, MD  albuterol (VENTOLIN HFA) 108 (90 Base) MCG/ACT inhaler TAKE 2 PUFFS BY MOUTH EVERY 6 HOURS AS NEEDED FOR WHEEZE OR SHORTNESS OF BREATH 04/23/22   Olalere, Onnie Boer A, MD  Ascorbic Acid (VITAMIN C PO) Take 1 tablet by mouth daily.    [provider]  atorvastatin (LIPITOR) 40 MG tablet  TAKE 1 TABLET BY MOUTH EVERY DAY 04/30/22   [name], MD  azelastine (ASTELIN) 0.1 % nasal spray Place 1 spray into both nostrils 2 (two) times daily. Use in each nostril as directed 01/17/22   Marrianne Mood, MD  benzonatate (TESSALON PERLES) 100 MG capsule Take 1 capsule (100 mg total) by mouth every 6 (six) hours as needed for cough. 01/01/22 01/01/23  [name], MD  Blood Glucose Monitoring Suppl (ACCU-CHEK GUIDE ME) w/Device KIT Dispense one device 02/16/20   [name], MD  Brexpiprazole (REXULTI) 3 MG TABS Take 1 tablet (3 mg total) by mouth daily. 03/07/22   [name], Phillips Grout, MD  Capsaicin 0.025 % PTCH Apply 1 patch topically daily as needed. 02/16/20   [name], MD  cetirizine (ZYRTEC) 10 MG tablet Take 1 tablet (10 mg total) by mouth daily. 01/09/22   [name], MD  clindamycin (CLINDAGEL) 1 % gel APPLY TO AFFECTED AREA TWICE A DAY 04/25/22   [name], MD  [employer] D3 25 MCG (1000 UT) capsule TAKE 1 CAPSULE BY MOUTH EVERY DAY 02/26/22   [name], MD  diclofenac Sodium (VOLTAREN) 1 % GEL APPLY 2 GRAMS TOPICALLY 4 (FOUR) TIMES DAILY AS NEEDED FOR KNEE PAIN 01/24/22   [name], MD  doxepin (SINEQUAN) 10 MG capsule Take 1 capsule (10 mg total) daily at bedtime. 05/01/22   [name], Phillips Grout, MD  doxycycline (VIBRAMYCIN) 100 MG capsule Take 1 capsule (100 mg total) by mouth 2 (two) times daily. 04/23/22   Prosperi, Christian H, PA-C  ENTRESTO 49-51 MG Take 1 tablet by mouth 2 (two) times daily. 09/25/20   [provider]  famotidine (PEPCID) 20 MG tablet TAKE 1 TABLET BY MOUTH EVERY DAY 03/06/22   [name], MD  ferrous sulfate 325 (65 FE) MG EC tablet Take 1 tablet (325 mg total) by mouth daily with breakfast. 05/24/21 05/24/22  [name], MD  fluticasone (FLONASE) 50 MCG/ACT nasal spray SPRAY 1 SPRAY INTO BOTH NOSTRILS DAILY. 03/27/22   [name], MD  ipratropium (ATROVENT) 0.06 % nasal spray PLEASE SEE ATTACHED FOR DETAILED DIRECTIONS 04/14/20   [provider]  lamoTRIgine (LAMICTAL) 150 MG tablet Take 1 tablet (150 mg total) by mouth daily. 03/07/22   [name], Phillips Grout, MD  lidocaine (LIDODERM) 5 % PLACE 2 PATCHES ONTO THE SKIN DAILY. REMOVE & DISCARD PATCH WITHIN 12 HOURS OR AS DIRECTED BY MD 03/27/22   [name], MD  meclizine (ANTIVERT) 25 MG tablet TAKE 1 TABLET BY MOUTH THREE TIMES A DAY AS NEEDED FOR DIZZINESS 02/28/22   [name], MD  megestrol (MEGACE) 40 MG tablet Take 2 tablets (80 mg total) by mouth daily. Patient not taking: Reported on 04/10/2022 04/11/21   Reva Bores, MD  metoprolol succinate (TOPROL-XL) 50 MG 24 hr tablet Take 1 tablet (50 mg total) by mouth daily. 05/23/21   [name], MD  omeprazole (PRILOSEC) 40 MG capsule TAKE 1 CAPSULE (40 MG TOTAL) BY MOUTH DAILY.  10/09/21   Miguel Aschoff, MD  ondansetron (ZOFRAN-ODT) 8 MG disintegrating tablet TAKE 1 TABLET BY MOUTH EVERY 8 HOURS AS NEEDED FOR NAUSEA AND VOMITING 05/08/22   [name], MD  Semaglutide, 1 MG/DOSE, (OZEMPIC, 1 MG/DOSE,) 4 MG/3ML SOPN INJECT 1 MG INTO THE SKIN ONE TIME PER WEEK 03/27/22   [name], MD  Semaglutide, 2 MG/DOSE, (OZEMPIC, 2 MG/DOSE,) 8 MG/3ML SOPN INJECT 2 MG INTO THE SKIN ONCE A WEEK. 05/13/22   [name], MD  [facility]  160-4.5 MCG/ACT inhaler INHALE 2 PUFFS INTO THE LUNGS IN THE MORNING AND AT BEDTIME. 03/28/22   Olalere, Adewale A, MD  tiZANidine (ZANAFLEX) 4 MG tablet Take 1 tablet (4 mg total) by mouth every 8 (eight) hours as needed for muscle spasms. 05/02/22 05/02/23  Atway, Rayann N, DO  torsemide (DEMADEX) 20 MG tablet TAKE 2 TABLETS BY MOUTH TWICE A DAY 09/14/21   Mercie Eon, MD  traMADol (ULTRAM) 50 MG tablet TAKE 1 TABLET BY MOUTH EVERY 4 HOURS AS NEEDED. 05/02/22   Atway, Rayann N, DO  vitamin B-12 (CYANOCOBALAMIN) 1000 MCG tablet Take 1 tablet (1,000 mcg total) by mouth daily. 05/06/17   [name], MD  Vitamin E 400 units TABS Take 400 Units by mouth daily.     [provider]      Allergies    Lisinopril    Review of Systems   Review of Systems  Physical Exam Updated Vital Signs BP (!) 141/99   Pulse (!) 111   Temp 99.5 F (37.5 C)   Resp 20   Ht  (1.803 m)   Wt (!) 154 kg   SpO2 100%   BMI 47.35 kg/m  Physical Exam Vitals and nursing note reviewed.  Constitutional:      General: She is not in acute distress.    Appearance: She is well-developed. She is not diaphoretic.  HENT:     Head: Normocephalic and atraumatic.  Eyes:     Conjunctiva/sclera: Conjunctivae normal.  Cardiovascular:     Rate and Rhythm: Normal rate and regular rhythm.     Heart sounds: Normal heart sounds. No murmur heard.    No friction rub. No gallop.  Pulmonary:     Effort: Pulmonary effort is normal. No respiratory distress.      Breath sounds: Normal breath sounds. No wheezing or rales.  Abdominal:     General: There is no distension.     Palpations: Abdomen is soft.     Tenderness: There is no abdominal tenderness. There is no guarding.  Musculoskeletal:        General: No tenderness.     Cervical back: Normal range of motion.  Skin:    General: Skin is warm and dry.     Findings: No erythema or rash.  Neurological:     Mental Status: She is alert and oriented to person, place, and time.     Cranial Nerves: Cranial nerve deficit: bilaterally does not smile, shows teeth wtih symmetry..     Sensory: Sensory deficit (left sided, has extinction to dual stimuli) present.     Motor: No weakness.     ED Results / Procedures / Treatments   Labs (all labs ordered are listed, but only abnormal results are displayed) Labs Reviewed  CBC WITH DIFFERENTIAL/PLATELET - Abnormal; Notable for the following components:      Result Value   Hemoglobin 11.6 (*)    RDW 16.3 (*)    All other components within normal limits  COMPREHENSIVE METABOLIC PANEL - Abnormal; Notable for the following components:   Chloride 112 (*)    CO2 19 (*)    AST 12 (*)    All other components within normal limits  I-STAT CHEM 8, ED - Abnormal; Notable for the following components:   Chloride 114 (*)    Calcium, Ion 1.14 (*)    TCO2 20 (*)    All other components within normal limits  PROTIME-INR  BRAIN NATRIURETIC PEPTIDE  CBG MONITORING, ED  I-STAT BETA HCG BLOOD, ED ([facility], [facility], [facility] ONLY)  TROPONIN I (HIGH SENSITIVITY)  TROPONIN I (HIGH SENSITIVITY)    EKG EKG Interpretation  Date/Time:  Thursday May 16 2022 08:09:47 EDT Ventricular Rate:  93 PR Interval:  144 QRS Duration: 81 QT Interval:  445 QTC Calculation: 554 R Axis:   35 Text Interpretation: Sinus rhythm Probable anteroseptal infarct, old Borderline T abnormalities, inferior leads Prolonged QT interval No significant change since last tracing Confirmed by Alvira Monday ([id number]) on 05/16/2022 10:11:41 AM  Radiology MR BRAIN WO CONTRAST  Result Date: 05/16/2022 CLINICAL DATA:  Stroke suspected EXAM: MRI HEAD WITHOUT CONTRAST TECHNIQUE: Multiplanar, multiecho pulse sequences of the brain and surrounding structures were obtained without intravenous contrast. COMPARISON:  MRI Head 07/01/20 FINDINGS: Brain: Negative for an acute infarct. No hydrocephalus. No hemorrhage. Cavum septum pellucidum et vergae. Partially empty sella. Sequela of minimal chronic microvascular ischemic change. No hemorrhage Vascular: Normal flow voids. Skull and upper cervical spine: Normal marrow signal. Sinuses/Orbits: No middle ear or mastoid effusion. Paranasal sinuses are clear. Orbits are unremarkable. Other: None IMPRESSION: No acute intracranial abnormality. Electronically Signed   By: Lorenza Cambridge M.D.   On: 05/16/2022 13:13   DG Knee Complete 4 Views Left  Result Date: 05/16/2022 CLINICAL DATA:  52 year old female with pain. EXAM: LEFT KNEE - COMPLETE 4+ VIEW COMPARISON:  Left knee series 11/22/2019. FINDINGS: Bone mineralization remains normal. No joint effusion identified on cross-table lateral view. Joint spaces and alignment maintained. No significant osseous abnormality identified. Stable, unremarkable soft tissue contours. IMPRESSION: Negative. Electronically Signed   By: Odessa Fleming M.D.   On: 05/16/2022 11:50   CT Angio Chest/Abd/Pel for Dissection W and/or Wo Contrast  Result Date: 05/16/2022 CLINICAL DATA:  Acute aortic syndrome (AAS) suspected. EXAM: CT ANGIOGRAPHY CHEST, ABDOMEN AND PELVIS TECHNIQUE: Non-contrast CT of the chest was initially obtained. Multidetector CT imaging through the chest, abdomen and pelvis was performed using the standard protocol during bolus administration of intravenous contrast. Multiplanar reconstructed images and MIPs were obtained and reviewed to evaluate the vascular anatomy. RADIATION DOSE REDUCTION: This exam was performed according to the  departmental dose-optimization program which includes automated exposure control, adjustment of the mA and/or kV according to patient size and/or use of iterative reconstruction technique. CONTRAST:  OMNIPAQUE IOHEXOL 350 MG/ML SOLN COMPARISON:  CT abdomen and pelvis 04/23/2022. CTA chest 03/06/2019. FINDINGS: CTA CHEST FINDINGS Cardiovascular: No evidence of a thoracic aortic dissection, aneurysm, or intramural hematoma. Upper limits of normal heart size. No pericardial effusion. Chronically enlarged main pulmonary artery measuring 3.6 cm in diameter. Mediastinum/Nodes: No enlarged axillary, mediastinal, or hilar lymph nodes. Unremarkable thyroid and esophagus. Lungs/Pleura: No pleural effusion or pneumothorax. No lung consolidation or mass. Musculoskeletal: No acute osseous abnormality or suspicious osseous lesion. Mild thoracic spondylosis and dextroscoliosis. Chronic T3 compression fracture. Review of the MIP images confirms the above findings. CTA ABDOMEN AND PELVIS FINDINGS VASCULAR Detailed assessment is limited by suboptimal arterial opacification and body habitus. Aorta: Patent with mild atherosclerotic plaque. No evidence of dissection, aneurysm, or stenosis. Celiac: Patent without evidence of a flow limiting proximal stenosis. SMA: Patent without evidence of a significant proximal stenosis. Renals: Patent. Patent main renal arteries bilaterally. Calcified plaque in the proximal right renal artery likely results in mild stenosis. IMA: Poorly visualized. Inflow: Patent with mild atherosclerosis. No aneurysm or gross dissection. Veins: No obvious venous abnormality within the limitations of this arterial phase study. Review of the MIP images confirms the above findings. NON-VASCULAR Hepatobiliary:  No focal liver abnormality is seen. No gallstones, gallbladder wall thickening, or biliary dilatation. Pancreas: Unremarkable. Spleen: Unremarkable. Adrenals/Urinary Tract: Unremarkable adrenal glands.  Excreted contrast in the renal collecting systems bilaterally from the earlier head and neck CTA. No evidence of a renal mass or hydronephrosis. Excreted contrast in the bladder which is otherwise grossly unremarkable. Stomach/Bowel: The stomach is unremarkable. There is no evidence of bowel obstruction or inflammation. The appendix is unremarkable. Lymphatic: No enlarged lymph nodes. Reproductive: Uterus and bilateral adnexa are grossly unremarkable. Other: No ascites or pneumoperitoneum. Musculoskeletal: No acute osseous abnormality or suspicious osseous lesion. Mild lumbar levoscoliosis. Review of the MIP images confirms the above findings. IMPRESSION: 1. No evidence of aortic dissection or aneurysm. 2. No evidence of acute abnormality in the chest, abdomen, or pelvis. 3. Chronically enlarged main pulmonary artery which can be seen with pulmonary arterial hypertension. 4.  Aortic Atherosclerosis (ICD10-I70.0). Electronically Signed   By: Sebastian Ache M.D.   On: 05/16/2022 10:25   CT ANGIO HEAD NECK W WO CM  Result Date: 05/16/2022 CLINICAL DATA:  Neuro deficit, acute, stroke suspected EXAM: CT ANGIOGRAPHY HEAD AND NECK WITH AND WITHOUT CONTRAST TECHNIQUE: Multidetector CT imaging of the head and neck was performed using the standard protocol during bolus administration of intravenous contrast. Multiplanar CT image reconstructions and MIPs were obtained to evaluate the vascular anatomy. Carotid stenosis measurements (when applicable) are obtained utilizing NASCET criteria, using the distal internal carotid diameter as the denominator. RADIATION DOSE REDUCTION: This exam was performed according to the departmental dose-optimization program which includes automated exposure control, adjustment of the mA and/or kV according to patient size and/or use of iterative reconstruction technique. CONTRAST:  OMNIPAQUE IOHEXOL 350 MG/ML SOLN COMPARISON:  None Available. FINDINGS: CT HEAD FINDINGS Brain: No evidence  of acute large vascular territory infarction, hemorrhage, hydrocephalus, extra-axial collection or mass lesion/mass effect. Vascular: See below. Skull: No acute fracture. Sinuses/Orbits: Clear sinuses.  No acute orbital findings. Other: No mastoid effusions. Review of the MIP images confirms the above findings CTA NECK FINDINGS Aortic arch: Great vessel origins are patent without significant stenosis. Right carotid system: No evidence of dissection, stenosis (50% or greater), or occlusion. Retropharyngeal course. Left carotid system: No evidence of dissection, stenosis (50% or greater), or occlusion. Retropharyngeal course. Vertebral arteries: Right dominant. No evidence of dissection, stenosis (50% or greater), or occlusion. Skeleton: No acute abnormality on limited assessment. Other neck: No acute abnormality on limited assessment. Upper chest: Visualized lung apices are clear. Review of the MIP images confirms the above findings CTA HEAD FINDINGS Anterior circulation: Bilateral intracranial ICAs, MCAs, and ACAs are patent without proximal hemodynamically significant stenosis. Posterior circulation: Bilateral intradural vertebral arteries, basilar artery and bilateral posterior cerebral arteries are patent without proximal hemodynamically significant stenosis. Hypoplastic/non-dominant left vertebral artery which largely terminates as PICA, anatomic variant. Venous sinuses: As permitted by contrast timing, patent. Review of the MIP images confirms the above findings IMPRESSION: No large vessel occlusion or proximal hemodynamically significant stenosis. Electronically Signed   By: Feliberto Harts M.D.   On: 05/16/2022 10:01   DG Chest Portable 1 View  Result Date: 05/16/2022 CLINICAL DATA:  52 year old female with shortness of breath. History of COPD, home oxygen. EXAM: PORTABLE CHEST 1 VIEW COMPARISON:  Portable chest 04/23/2022 and earlier. FINDINGS: Portable AP upright view at 0840 hours. The patient is  mildly rotated to the left. Multiple EKG leads and wires overlie the chest. Stable lung volumes and mediastinal contours but indistinct pulmonary vasculature, vague perihilar  and lower lung opacity. No pneumothorax, pleural effusion or consolidation. Paucity bowel gas in the visible abdomen. No acute osseous abnormality identified. IMPRESSION: Indistinct vasculature and vague bilateral lower lung opacity on this rotated portable view. Cannot exclude pulmonary interstitial edema. Electronically Signed   By: Odessa Fleming M.D.   On: 05/16/2022 08:54    Procedures Procedures    Medications Ordered in ED Medications  iohexol (OMNIPAQUE) 350 MG/ML injection 120 mL (120 mLs Intravenous Contrast Given 05/16/22 0944)  methylPREDNISolone sodium succinate (SOLU-MEDROL) 125 mg/2 mL injection 125 mg (125 mg Intravenous Given 05/16/22 1038)  HYDROcodone-acetaminophen (NORCO/VICODIN) 5-325 MG per tablet 1 tablet (1 tablet Oral Given 05/16/22 1043)  LORazepam (ATIVAN) injection 0.5 mg (0.5 mg Intravenous Given 05/16/22 1323)    ED Course/ Medical Decision Making/ A&P                              52 year old female with a history of CHF, COPD/OSA/OHS on chronic oxygen 3 L, hypertension, CVA, who presents with concern for lower lip swelling and left-sided numbness, also reports sharp chest pain, nausea, dyspnea.  Regarding lip swelling--appears isolated to lip, no other sign of anaphylaxis nor airway compromise.  Do feel larger concern at this time for neurologic symptoms, chest pain. Gave dose of solumedrol.  No progression of mild swelling on reevaluation. Stable for outpatient treatment with steroid, benadryl.  Given symptoms began yesterday morning while they have been waxing and waning I do not think we can define a true LNW within last 4.5hr-24hr and risks of bleeding/intervention outweigh benefits.  DDx includes ICH, CVA, metastases, electrolyte abnormality, DKA, withdrawal/other toxic metabolic, dissection,  ACS, seizure, functional.   Labs completed and personally evaluated by me show no clniically significant anemia nor electrolyte abnormalities, bicarb 19, normal INR, BNP, troponin, neg pregnancy.   CTA head/neck completed to evaluate for occlusion or significant stenosis given waxing and waning symptoms--shows no large vessel occlusion or hemodynamically significant stenosis.   CTA dissection study completed given sharp chest pain and neurologic changes reviewed showing no evidence of dissection or aneurysm, chronically enlarged pulmonary artery which can be seen with arterial hypertension.    Discussed with Dr. Otelia Limes Neurology. Given shaking of leg did stop with activity not consistent with seizure and can evaluate other left sided symptoms with MRI and if negative would not require admission for the left sided numbness.  MR brain ordered shows no acute abnormalities.  Possible left sided symptoms, cp secondary to stress. Recommend outpatient follow up.    Also reports left knee pain from stepping wrong getting into ambulance--xr ordered showing no acute abnormalities, recommend PCP follow up.            Final Clinical Impression(s) / ED Diagnoses Final diagnoses:  Left sided numbness  Chest pain, unspecified type  Lip swelling    Rx / DC Orders ED Discharge Orders          Ordered    predniSONE (DELTASONE) 10 MG tablet  Daily        05/16/22 1441              Alvira Monday, MD 05/17/22 2224

## 2022-05-16 NOTE — ED Notes (Signed)
Checked patient cng it was 27 notified RN of blood sugar

## 2022-05-16 NOTE — ED Triage Notes (Signed)
Woke up with facial droop and numbness to left side of lips that resolved and ongoing lip swelling yesterday. On home O2 at 3LPM [place] due to COPD. Hx of stroke and not on blood thinners.  Alert and oriented x 4.

## 2022-05-16 NOTE — ED Notes (Signed)
Got patient back on the monitor patient is resting with call bell in reach  

## 2022-05-16 NOTE — Discharge Instructions (Addendum)
Take benadryl every 6 hours for the next 48 hours.

## 2022-05-17 ENCOUNTER — Ambulatory Visit (INDEPENDENT_AMBULATORY_CARE_PROVIDER_SITE_OTHER): Payer: 59 | Admitting: Licensed Clinical Social Worker

## 2022-05-17 DIAGNOSIS — F410 Panic disorder [episodic paroxysmal anxiety] without agoraphobia: Secondary | ICD-10-CM | POA: Diagnosis not present

## 2022-05-17 DIAGNOSIS — F411 Generalized anxiety disorder: Secondary | ICD-10-CM | POA: Diagnosis not present

## 2022-05-17 DIAGNOSIS — F331 Major depressive disorder, recurrent, moderate: Secondary | ICD-10-CM

## 2022-05-17 DIAGNOSIS — F4312 Post-traumatic stress disorder, chronic: Secondary | ICD-10-CM | POA: Diagnosis not present

## 2022-05-17 NOTE — Progress Notes (Signed)
Virtual Visit via Video Note  I connected with Lindsay [name] on 05/17/22 at  8:00 AM EDT by a video enabled telemedicine application and verified that I am speaking with the correct person using two identifiers.  Location: Patient: home Provider: home office   I discussed the limitations of evaluation and management by telemedicine and the availability of in person appointments. The patient expressed understanding and agreed to proceed.  I discussed the assessment and treatment plan with the patient. The patient was provided an opportunity to ask questions and all were answered. The patient agreed with the plan and demonstrated an understanding of the instructions.   The patient was advised to call back or seek an in-person evaluation if the symptoms worsen or if the condition fails to improve as anticipated.  I provided 40 minutes of non-face-to-face time during this encounter.  THERAPIST PROGRESS NOTE  Session Time: 8:00 AM to 8:40 AM  Participation Level: Active  Behavioral Response: CasualAlertDysphoric  Type of Therapy: Individual Therapy ProgressTowards Goals:  patient continue to utilize therapy for  supportive and strength-based interventions, provide treatment interventions in the context of patient continuing to seek help and cope with medical issues, anxiety, triggers, coping with mental health symptoms, trauma, utilize therapy as a way for patient to focus on working through current stressors that also helps to distract from pain Progressing-therapy is significant role in processing thoughts and feelings and being provided with supportive strength-based interventions as patient is managing significant stressor of medical issues  Interventions: Solution Focused, Strength-based, Supportive, and Reframing  Summary: Lindsay [name] is a 52 y.o. female who presents with the doctor's office said stroke said to call 911 didn't call called yesterday. Symptoms are chest pain, left  side numbness the ER let her go and symptoms are going back but this morning better as far as that is concerned just tired. They did a CT, MRI x-rays blood work twice. They did not find anything lady said stress related. It is coming back yesterday. Today just tired no chest pain or numbness. Explored what she thinks doesn't know what to think. Stress would be her health issues. Haven't slept a couple of days due to pain didn't sleep until 3:00 AM dream having heart attack kept calling her mom's name. Didn't get to [facility] transportation didn't come short on drivers said can reschedule she said no but they said can't accommodate.  Therapist noted a pattern patient has symptoms but no answers at same time positive that symptoms that got her to ER are subsided and test says ok. Patient said feel awful doesn't know why.  Therapist pointed out sleep issue probably related to mood . Don't want to talk medicine this morning on Wednesday the stroke symptoms started happening after she took her meds.  Therapist encouraged her not to take it on an empty stomach patient said thought it was an emergency reaction to the medication. Climbed into ambulance from the side couldn't do it stumbled backwards trying to get in there hurt both knees so knees swollen. New problems along with old ones. Doesn't know plan going forward. Keep appointments May 1 see primary. Went to see her and they said go to emergency room that is when didn't go. They saw that patient was in panic when told her that it was scary. Therapist said it is scary when have those type of symptoms therapist used reframing to say at least test are not pointing out have anything. Talked about supports Chales Abrahams and sister cheer her  up.  Patient also says doctor took her off panic medication put her on doxepin for sleep need to increase not working. Purpose reviewed treatment plan noting medical issues main stressor patient gave consent to complete virtually.  Therapist  providing space and support for patient to talk and processed thoughts and feelings.  Significant interventions are supportive and strength-based as therapist spends time with patient that she needs to process and for support.  .  Suicidal/Homicidal: No  Plan: Return again in 2 weeks.2.Assess very helpful for patient to get supportive interventions processed thoughts and feelings in session  Diagnosis: Major depressive disorder, recurrent, moderate, generalized anxiety disorder, chronic PTSD, panic attacks  Collaboration of Care: Other none needed  Patient/Guardian was advised Release of Information must be obtained prior to any record release in order to collaborate their care with an outside provider. Patient/Guardian was advised if they have not already done so to contact the registration department to sign all necessary forms in order for [name] to release information regarding their care.   Consent: Patient/Guardian gives verbal consent for treatment and assignment of benefits for services provided during this visit. Patient/Guardian expressed understanding and agreed to proceed.   Coolidge Breeze, LCSW 05/17/2022

## 2022-05-19 ENCOUNTER — Telehealth: Payer: Self-pay | Admitting: Internal Medicine

## 2022-05-19 NOTE — Telephone Encounter (Signed)
Patient requesting call back on after-hours line due to knee pain. Patient explains that she recently came to the hospital due to chest pain and L sided numbness/tingling earlier this week and that when she was walking to the ambulance, she hurt her knee and has since been dealing with a lot of pain to the left knee. An x-ray was done in the ED and was normal, and she was advised to follow-up with her PCP.  She does have a history of bilateral knee pain at baseline and was recommended to RTC if she had ongoing pain for consideration of steroid injection.  She states that she was told she would receive a call tomorrow or Tuesday by the clinic to schedule an appointment to follow up from her recent ED visit but intends to call at clinic opening tomorrow  to expedite the process.  Of note she has tried alternating heat and ice, tylenol, ibuprofen, and tramadol which are on her home medication list. I advised that she continue alternating heat and ice and conservative management until she can be assessed further in person. She has voltaren gel on her medication list and I advised that she could try using this as well. I advised that she stay off the knee if able.  All questions answered.  Champ Mungo, DO

## 2022-05-20 ENCOUNTER — Other Ambulatory Visit: Payer: Self-pay | Admitting: Internal Medicine

## 2022-05-20 DIAGNOSIS — R42 Dizziness and giddiness: Secondary | ICD-10-CM

## 2022-05-21 ENCOUNTER — Telehealth: Payer: Self-pay

## 2022-05-21 NOTE — Telephone Encounter (Signed)
Patient called she stated she is having stroke like symptoms (patient went to the ED 4/18) and was told she did not have a stroke. Patient stated a couple of her symptoms has returned such as lip swelling and facial swelling. Please return patients call. Pls refer to back to [facility] note from 4/17.

## 2022-05-22 ENCOUNTER — Ambulatory Visit (INDEPENDENT_AMBULATORY_CARE_PROVIDER_SITE_OTHER): Payer: 59

## 2022-05-22 DIAGNOSIS — R29810 Facial weakness: Secondary | ICD-10-CM | POA: Diagnosis not present

## 2022-05-22 DIAGNOSIS — R220 Localized swelling, mass and lump, head: Secondary | ICD-10-CM | POA: Diagnosis not present

## 2022-05-22 MED ORDER — PREDNISONE 10 MG PO TABS: 20.0000 mg | ORAL_TABLET | Freq: Every day | ORAL | 0 refills | Status: DC

## 2022-05-22 NOTE — Assessment & Plan Note (Signed)
Patient presents with 1 week of facial droop on the left.  Also endorses left-sided headache and left upper extremity numbness and tingling.  Has been to ED since onset of symptoms with normal brain MR.  She denies any difficulty closing her left eye.  Endorses some dysarthria yesterday but this is improved.  Denies fever, chills, rash.  Unsure of etiology but patients symptoms resolved after 3-day course of prednisone.  It is possible this is Bell's palsy though unable to diagnose over the phone.  Patient endorses full sensation in both sides of the face. -Will treat with prednisone 20 mg daily for 5 days -Instructed to schedule in person visit early next week if symptoms have not resolved

## 2022-05-22 NOTE — Assessment & Plan Note (Signed)
Discussed patient's lip swelling via telehealth.  Began about a week ago at the same time that she developed facial droop on the left.  Denies any rashes or swelling elsewhere.  Denies dizziness.  Endorses allergy to lisinopril.  She is on Entresto but has been taking this for 2 years.  No recent med changes.  Endorses resolution with Benadryl but has not taken this for a couple days and symptoms returned.  Denies difficulty with breathing or swallowing.  Possible that this is due to allergic reaction, the allergic reaction with ACE inhibitor unlikely at this point. -Continue with Benadryl as needed

## 2022-05-22 NOTE — Telephone Encounter (Signed)
Returned call to patient. Patient was unable to keep in-person appt today 2/2 lack of transportation. She has been given tele appt for 1315 today.

## 2022-05-22 NOTE — Progress Notes (Signed)
[facility] Medicine Residency Telephone Encounter Continuity Care Appointment  HPI:  This telephone encounter was created for [name] on 05/22/2022 for the following purpose/cc stroke-like symptoms. Face is drooping and bottom lip is swollen. Smile is asymmetric. Started last Wednesday. Went to ED 4/18, negative head imaging. Discharged on prednisone x 3 days and benadryl. Endorses resolution of swelling and drooping with this. Also had some dysarthria. Also endorses ear pain, headache on the left. Endorses nausea. Denies vomiting. Denies rashes. Denies neck stiffness. Denies throat/mouth swelling to degree of having difficulty breathing. No recent med changes. Denies substance abuse. Endorses numbness at left hand and arm. Able to close both eyes fully. States she has sensation on both side of the face.    Past Medical History:  Past Medical History:  Diagnosis Date   Acid reflux    Anemia    Iron Def   Anorexia    CHF (congestive heart failure)    Chronic kidney disease    Nephrotic syndrome   Colon polyp 2009   Depression with anxiety    Edema leg    Fibromyalgia    Hemorrhoids    Hidradenitis suppurativa    Hypertension    IBS (irritable bowel syndrome)    Low back pain    Migraines    Morbidly obese    Neuromuscular disorder    fibromyalgia   Neuropathy    Panic attacks    Polyp of vocal cord or larynx    Stroke    Tonsil pain      ROS:  See detailed assessment and plan for pertinent ROS.   Assessment / Plan / Recommendations:  Please see A&P under problem oriented charting for assessment of the patient's acute and chronic medical conditions.  As always, pt is advised that if symptoms worsen or new symptoms arise, they should go to an urgent care facility or to to ER for further evaluation.   Consent and Medical Decision Making:  Patient discussed with Dr. Cleda Daub This is a telephone encounter between Lindsay [name] and Adron Bene on  05/22/2022 for stroke-like symptoms. The visit was conducted with the patient located at home and Adron Bene at [facility]. The patient's identity was confirmed using their DOB and current address. The patient has consented to being evaluated through a telephone encounter and understands the associated risks (an examination cannot be done and the patient may need to come in for an appointment) / benefits (allows the patient to remain at home, decreasing exposure to coronavirus). I personally spent 20 minutes on medical discussion.  Facial droop Patient presents with 1 week of facial droop on the left.  Also endorses left-sided headache and left upper extremity numbness and tingling.  Has been to ED since onset of symptoms with normal brain MR.  She denies any difficulty closing her left eye.  Endorses some dysarthria yesterday but this is improved.  Denies fever, chills, rash.  Unsure of etiology but patients symptoms resolved after 3-day course of prednisone.  It is possible this is Bell's palsy though unable to diagnose over the phone.  Patient endorses full sensation in both sides of the face. -Will treat with prednisone 20 mg daily for 5 days -Instructed to schedule in person visit early next week if symptoms have not resolved  Lip swelling Discussed patient's lip swelling via telehealth.  Began about a week ago at the same time that she developed facial droop on the left.  Denies any rashes or swelling  elsewhere.  Denies dizziness.  Endorses allergy to lisinopril.  She is on Entresto but has been taking this for 2 years.  No recent med changes.  Endorses resolution with Benadryl but has not taken this for a couple days and symptoms returned.  Denies difficulty with breathing or swallowing.  Possible that this is due to allergic reaction, the allergic reaction with ACE inhibitor unlikely at this point. -Continue with Benadryl as needed

## 2022-05-24 ENCOUNTER — Other Ambulatory Visit (HOSPITAL_COMMUNITY): Payer: Self-pay | Admitting: Psychiatry

## 2022-05-29 ENCOUNTER — Encounter: Payer: 59 | Admitting: Internal Medicine

## 2022-05-29 ENCOUNTER — Other Ambulatory Visit: Payer: Self-pay | Admitting: Internal Medicine

## 2022-05-29 DIAGNOSIS — F1190 Opioid use, unspecified, uncomplicated: Secondary | ICD-10-CM

## 2022-05-29 DIAGNOSIS — M797 Fibromyalgia: Secondary | ICD-10-CM

## 2022-05-29 NOTE — Progress Notes (Signed)
Internal Medicine Clinic Attending  Case discussed with the resident at the time of the visit.  We reviewed the resident's history and exam and pertinent patient test results.  I agree with the assessment, diagnosis, and plan of care documented in the resident's note.  

## 2022-05-30 ENCOUNTER — Telehealth: Payer: Self-pay | Admitting: Internal Medicine

## 2022-05-30 ENCOUNTER — Other Ambulatory Visit: Payer: Self-pay | Admitting: Internal Medicine

## 2022-05-30 ENCOUNTER — Other Ambulatory Visit: Payer: Self-pay

## 2022-05-30 DIAGNOSIS — F1190 Opioid use, unspecified, uncomplicated: Secondary | ICD-10-CM

## 2022-05-30 MED ORDER — TRAMADOL HCL 50 MG PO TABS
50.0000 mg | ORAL_TABLET | ORAL | 0 refills | Status: DC | PRN
Start: 2022-05-30 — End: 2022-05-31

## 2022-05-30 NOTE — Telephone Encounter (Signed)
The patient called the after hours pager requesting a refill of her chronic tramadol prescription. She was supposed to have appt 5/1, but it was cancelled by the [facility] and rescheduled for 6/19.   Reviewed PDMP and sent in refill.

## 2022-05-31 ENCOUNTER — Ambulatory Visit (INDEPENDENT_AMBULATORY_CARE_PROVIDER_SITE_OTHER): Payer: 59 | Admitting: Licensed Clinical Social Worker

## 2022-05-31 DIAGNOSIS — F410 Panic disorder [episodic paroxysmal anxiety] without agoraphobia: Secondary | ICD-10-CM

## 2022-05-31 DIAGNOSIS — F331 Major depressive disorder, recurrent, moderate: Secondary | ICD-10-CM

## 2022-05-31 DIAGNOSIS — F4312 Post-traumatic stress disorder, chronic: Secondary | ICD-10-CM | POA: Diagnosis not present

## 2022-05-31 DIAGNOSIS — F411 Generalized anxiety disorder: Secondary | ICD-10-CM | POA: Diagnosis not present

## 2022-05-31 MED ORDER — TRAMADOL HCL 50 MG PO TABS
50.0000 mg | ORAL_TABLET | ORAL | 0 refills | Status: DC | PRN
Start: 2022-05-31 — End: 2022-07-10

## 2022-05-31 MED ORDER — ENTRESTO 49-51 MG PO TABS: 1.0000 | ORAL_TABLET | Freq: Two times a day (BID) | ORAL | 2 refills | Status: AC

## 2022-05-31 NOTE — Progress Notes (Signed)
Virtual Visit via Video Note  I connected with [name] on 05/31/22 at  8:00 AM EDT by a video enabled telemedicine application and verified that I am speaking with the correct person using two identifiers.  Location: Patient: home Provider: home office   I discussed the limitations of evaluation and management by telemedicine and the availability of in person appointments. The patient expressed understanding and agreed to proceed.   I discussed the assessment and treatment plan with the patient. The patient was provided an opportunity to ask questions and all were answered. The patient agreed with the plan and demonstrated an understanding of the instructions.   The patient was advised to call back or seek an in-person evaluation if the symptoms worsen or if the condition fails to improve as anticipated.  I provided 45 minutes of non-face-to-face time during this encounter.  THERAPIST PROGRESS NOTE  Session Time: 8:00 AM to 8:45 AM  Participation Level: Active  Behavioral Response: CasualAlertIrritable  Type of Therapy: Individual Therapy  Treatment Goals addressed: patient continue to utilize therapy for  supportive and strength-based interventions, provide treatment interventions in the context of patient continuing to seek help and cope with medical issues, anxiety, triggers, coping with mental health symptoms, trauma, utilize therapy as a way for patient to focus on working through current stressors that also helps to distract from pain  ProgressTowards Goals: Progressing-patient frustrated with treatment for medical issues able to process thoughts and feelings in session therapist offering supportive and strength-based interventions  Interventions: Solution Focused, Strength-based, Supportive, and Other: coping  Summary: Lindsay [name] is a 52 y.o. female who presents with supposed to see primary waiting for 3-4 months and now pushed back appointment for a month and a half  on June 19. Patient feels done with everything.  Therapist noted she had been at the ER with strokelike symptoms and plan was for her to follow-up right away with primary so understand patient being frustrated by this her appointment being pushed back without her having a voice this after she had just come out of the ER not having a quick follow-up.  Therapist validated patient how she was feeling. Patient said climbing into ambulance re-injured knee feel like torn meniscus going ask to do MRI on knee again.  Pain issue is still bad knee, back. Still feels like giving birth and bleeding. See the gynecologist on 15th. See Dr. Lolly Mustache on 8th. She is going to ask to increase dosage of medicine for sleep. Asked for an earlier appointment with him and doesn't have one. Therapist noted medical issues go on without changes sharing her impression. Sleep medicine not helping slept only two hours last night. The pain keeps her up and trying to get comfortable with her knee. Not going to walk outside with physical therapist. Karma Greaser at [facility] [employer] said need to submit paperwork didn't give them enough time.  Explored something positive Chales Abrahams still comes by. Went to his house and was fun. Watched television.  Wants primary care order for MRI tell her what is going on. Ligaments are torn. X-ray didn't show anything know need an MRI. The man who found out torn meniscus he was leaving January 15 when went back not there. New guy said don't have time for this. Rejected her and sent back to PCP. Explored options with patient. Patient says don't have too many clinics doctors who they violated so they moved or closed them down something about prescribing pain medicine to people when didn't need to. [employer]  Medical "showed his ass" wouldn't prescribe meds and wanted tests like he was primary. Look for positive. Patient said "we know for a fact people show their ass" and both patient and therapist laughed. Patient is sick of them.  Mom's birthday is the 6th her brother is going to have a cook out for her. Therapist noted it will help to get her mind off things help and patient agrees. Uncle from [place] coming which is her other brother. She will be 77. At least 10-15 people. Ask him to fix margarita's. Therapist noted get her mind off things for the moment and that it would be good to have more of these type of activities in her life.  Went back to talking about the doctor's and looking at options and they are limited. PCP won't put her on oxycodone put her on Tramadol not helping. Want her to be aware not on Traxene. Therapist said making a good point at this point having a stronger med would be more helpful with a stronger pain med patient could be more comfortable take every 8 hours instead of 4 hours. Patient says the doctor doesn't understand don't want to take medicine the least amount is best for her. It seems that the priority is not the benzo but a pain medicine make her more comfortable. Wants doctor to look at therapist note so better idea what patient needs. Will ask to sign a paper. Therapist thinks this would help clarify the issue notes say it is like it is and good for doctors to know.  Therapist provided space and support for patient to talk about thoughts and feelings to process to help cope with significant stressors  Suicidal/Homicidal: No  Plan: Return again in 2 weeks.2.Assess very helpful for patient to get supportive interventions processed thoughts and feelings in session  Diagnosis: Major depressive disorder, recurrent, moderate, generalized anxiety disorder, chronic PTSD, panic attacks  Collaboration of Care: Other none needed  Patient/Guardian was advised Release of Information must be obtained prior to any record release in order to collaborate their care with an outside provider. Patient/Guardian was advised if they have not already done so to contact the registration department to sign all necessary forms  in order for [name] to release information regarding their care.   Consent: Patient/Guardian gives verbal consent for treatment and assignment of benefits for services provided during this visit. Patient/Guardian expressed understanding and agreed to proceed.   Coolidge Breeze, LCSW 05/31/2022

## 2022-06-02 ENCOUNTER — Other Ambulatory Visit (HOSPITAL_COMMUNITY): Payer: Self-pay | Admitting: Psychiatry

## 2022-06-02 DIAGNOSIS — F331 Major depressive disorder, recurrent, moderate: Secondary | ICD-10-CM

## 2022-06-03 ENCOUNTER — Other Ambulatory Visit: Payer: Self-pay

## 2022-06-03 ENCOUNTER — Other Ambulatory Visit: Payer: Self-pay | Admitting: Internal Medicine

## 2022-06-03 DIAGNOSIS — M546 Pain in thoracic spine: Secondary | ICD-10-CM

## 2022-06-03 DIAGNOSIS — A084 Viral intestinal infection, unspecified: Secondary | ICD-10-CM

## 2022-06-03 DIAGNOSIS — M797 Fibromyalgia: Secondary | ICD-10-CM

## 2022-06-03 NOTE — Telephone Encounter (Signed)
Called and advised patient she needs to make a appointment for her med refill, patient stated she does not want to make a appointment right now she will give [name] a call back.

## 2022-06-03 NOTE — Telephone Encounter (Signed)
Next appt scheduled 6/19 with PCP. 

## 2022-06-04 ENCOUNTER — Other Ambulatory Visit: Payer: Self-pay | Admitting: Internal Medicine

## 2022-06-04 DIAGNOSIS — A084 Viral intestinal infection, unspecified: Secondary | ICD-10-CM

## 2022-06-04 MED ORDER — ONDANSETRON 8 MG PO TBDP
8.0000 mg | ORAL_TABLET | Freq: Three times a day (TID) | ORAL | 1 refills | Status: DC | PRN
Start: 2022-06-04 — End: 2022-07-27

## 2022-06-05 ENCOUNTER — Encounter (HOSPITAL_COMMUNITY): Payer: Self-pay | Admitting: Psychiatry

## 2022-06-05 ENCOUNTER — Telehealth (HOSPITAL_BASED_OUTPATIENT_CLINIC_OR_DEPARTMENT_OTHER): Payer: 59 | Admitting: Psychiatry

## 2022-06-05 VITALS — Wt 339.0 lb

## 2022-06-05 DIAGNOSIS — F4312 Post-traumatic stress disorder, chronic: Secondary | ICD-10-CM | POA: Diagnosis not present

## 2022-06-05 DIAGNOSIS — F331 Major depressive disorder, recurrent, moderate: Secondary | ICD-10-CM

## 2022-06-05 MED ORDER — REXULTI 3 MG PO TABS: 3.0000 mg | ORAL_TABLET | Freq: Every day | ORAL | 0 refills | Status: DC

## 2022-06-05 MED ORDER — LAMOTRIGINE 150 MG PO TABS: 150.0000 mg | ORAL_TABLET | Freq: Every day | ORAL | 0 refills | Status: DC

## 2022-06-05 MED ORDER — DOXEPIN HCL 25 MG PO CAPS
25.0000 mg | ORAL_CAPSULE | Freq: Every day | ORAL | 2 refills | Status: DC
Start: 2022-06-05 — End: 2022-07-03

## 2022-06-05 NOTE — Progress Notes (Signed)
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   WBC 7.1 4.0 - 10.5 K/uL   RBC 4.87 3.87 - 5.11 MIL/uL   Hemoglobin 13.1 12.0 - 15.0 g/dL   HCT 09.8 11.9 - 14.7 %   MCV 85.6 80.0 - 100.0 fL   MCH 26.9 26.0 - 34.0 pg   MCHC 31.4 30.0 - 36.0 g/dL   RDW 82.9 (H) 56.2 - 13.0 %   Platelets 524  (H) 150 - 400 K/uL   nRBC 0.0 0.0 - 0.2 %    Comment: Performed at [facility], [address], [place], [place] [address]  Urinalysis, Routine w reflex microscopic -Urine, Clean Catch     Status: Abnormal   Collection Time: 04/29/22 10:59 AM  Result Value Ref Range   Color, Urine YELLOW YELLOW   APPearance CLEAR CLEAR   Specific Gravity, Urine 1.016 1.005 - 1.030   pH 5.0 5.0 - 8.0   Glucose, UA NEGATIVE NEGATIVE mg/dL   Hgb urine dipstick NEGATIVE NEGATIVE   Bilirubin Urine SMALL (A) NEGATIVE   Ketones, ur NEGATIVE NEGATIVE mg/dL   Protein, ur NEGATIVE NEGATIVE mg/dL   Nitrite NEGATIVE NEGATIVE   Leukocytes,Ua NEGATIVE NEGATIVE    Comment: Performed at [facility], [address] [place], [place] [address]  I-Stat beta hCG blood, ED     Status: None   Collection Time: 04/29/22 11:19 AM  Result Value Ref Range   I-stat hCG, quantitative <5.0 <5 mIU/mL   Comment 3            Comment:   GEST. AGE      CONC.  (mIU/mL)   <=1 WEEK        5 - 50     2 WEEKS       50 - 500     3 WEEKS       100 - 10,000     4 WEEKS     1,000 - 30,000        FEMALE AND NON-PREGNANT FEMALE:     LESS THAN 5 mIU/mL   Wet prep, genital     Status: Abnormal   Collection Time: 04/29/22 12:28 PM  Result Value Ref Range   Yeast Wet Prep HPF POC PRESENT (A) NONE SEEN   Trich, Wet Prep NONE SEEN NONE SEEN   Clue Cells Wet Prep HPF POC NONE SEEN NONE SEEN   WBC, Wet Prep HPF POC <10 <10   Sperm NONE SEEN     Comment: Performed at [facility], [address], [place], [place] [address]  POC CBG, ED     Status: None   Collection Time: 05/16/22  8:10 AM  Result Value Ref Range   Glucose-Capillary 84 70 - 99 mg/dL    Comment: Glucose reference range applies only to samples taken after fasting for at least 8 hours.  Troponin I (High Sensitivity)     Status: None   Collection Time: 05/16/22  8:52 AM  Result Value Ref Range   Troponin I (High Sensitivity) 4 <18 ng/L    Comment:  (NOTE) Elevated high sensitivity troponin I (hsTnI) values and significant  changes across serial measurements may suggest ACS but many other  chronic and acute conditions are known to elevate hsTnI results.  Refer to the "Links" section for chest pain algorithms and additional  guidance. Performed at [facility], [address], [place], [place] [address]   CBC with Differential  Status: Abnormal   Collection Time: 05/16/22  8:52 AM  Result Value Ref Range   WBC 9.8 4.0 - 10.5 K/uL   RBC 4.28 3.87 - 5.11 MIL/uL   Hemoglobin 11.6 (L) 12.0 - 15.0 g/dL   HCT 09.8 11.9 - 14.7 %   MCV 85.0 80.0 - 100.0 fL   MCH 27.1 26.0 - 34.0 pg   MCHC 31.9 30.0 - 36.0 g/dL   RDW 82.9 (H) 56.2 - 13.0 %   Platelets 379 150 - 400 K/uL   nRBC 0.0 0.0 - 0.2 %   Neutrophils Relative % 67 %   Neutro Abs 6.5 1.7 - 7.7 K/uL   Lymphocytes Relative 24 %   Lymphs Abs 2.4 0.7 - 4.0 K/uL   Monocytes Relative 9 %   Monocytes Absolute 0.8 0.1 - 1.0 K/uL   Eosinophils Relative 0 %   Eosinophils Absolute 0.0 0.0 - 0.5 K/uL   Basophils Relative 0 %   Basophils Absolute 0.0 0.0 - 0.1 K/uL   Immature Granulocytes 0 %   Abs Immature Granulocytes 0.03 0.00 - 0.07 K/uL    Comment: Performed at [facility], [address], [place], [place] [address]  Comprehensive metabolic panel     Status: Abnormal   Collection Time: 05/16/22  8:52 AM  Result Value Ref Range   Sodium 141 135 - 145 mmol/L   Potassium 3.6 3.5 - 5.1 mmol/L   Chloride 112 (H) 98 - 111 mmol/L   CO2 19 (L) 22 - 32 mmol/L   Glucose, Bld 90 70 - 99 mg/dL    Comment: Glucose reference range applies only to samples taken after fasting for at least 8 hours.   BUN 9 6 - 20 mg/dL   Creatinine, Ser 4.69 0.44 - 1.00 mg/dL   Calcium 9.1 8.9 - 62.9 mg/dL   Total Protein 7.0 6.5 - 8.1 g/dL   Albumin 3.7 3.5 - 5.0 g/dL   AST 12 (L) 15 - 41 U/L   ALT 13 0 - 44 U/L   Alkaline Phosphatase 68 38 - 126 U/L   Total Bilirubin 0.9 0.3 - 1.2 mg/dL    GFR, Estimated >52 >84 mL/min    Comment: (NOTE) Calculated using the CKD-EPI Creatinine Equation (2021)    Anion gap 10 5 - 15    Comment: Performed at [facility], [address] [place], [place] [address]  Protime-INR     Status: None   Collection Time: 05/16/22  8:52 AM  Result Value Ref Range   Prothrombin Time 13.8 11.4 - 15.2 seconds   INR 1.1 0.8 - 1.2    Comment: (NOTE) INR goal varies based on device and disease states. Performed at [facility], [address], [place], [place] [address]   Brain natriuretic peptide     Status: None   Collection Time: 05/16/22  8:52 AM  Result Value Ref Range   B Natriuretic Peptide 30.7 0.0 - 100.0 pg/mL    Comment: Performed at [facility], [address] [place], [place] [address]  I-Stat beta hCG blood, ED ([facility], [facility], [facility] only)     Status: None   Collection Time: 05/16/22  8:56 AM  Result Value Ref Range   I-stat hCG, quantitative <5.0 <5 mIU/mL   Comment 3            Comment:   GEST. AGE      CONC.  (mIU/mL)   <=1 WEEK  5 - 50     2 WEEKS       50 - 500     3 WEEKS       100 - 10,000     4 WEEKS     1,000 - 30,000        FEMALE AND NON-PREGNANT FEMALE:     LESS THAN 5 mIU/mL   I-stat chem 8, ED (not at [facility], [facility] or [facility])     Status: Abnormal   Collection Time: 05/16/22  8:58 AM  Result Value Ref Range   Sodium 143 135 - 145 mmol/L   Potassium 3.6 3.5 - 5.1 mmol/L   Chloride 114 (H) 98 - 111 mmol/L   BUN 9 6 - 20 mg/dL   Creatinine, Ser 1.61 0.44 - 1.00 mg/dL   Glucose, Bld 88 70 - 99 mg/dL    Comment: Glucose reference range applies only to samples taken after fasting for at least 8 hours.   Calcium, Ion 1.14 (L) 1.15 - 1.40 mmol/L   TCO2 20 (L) 22 - 32 mmol/L   Hemoglobin 12.2 12.0 - 15.0 g/dL   HCT 09.6 04.5 - 40.9 %  Troponin I (High Sensitivity)     Status: None   Collection Time: 05/16/22 10:24 AM  Result Value Ref Range   Troponin I (High Sensitivity) 4 <18 ng/L    Comment:  (NOTE) Elevated high sensitivity troponin I (hsTnI) values and significant  changes across serial measurements may suggest ACS but many other  chronic and acute conditions are known to elevate hsTnI results.  Refer to the "Links" section for chest pain algorithms and additional  guidance. Performed at [facility], [address] [place], [place] [address]      Psychiatric Specialty Exam: Physical Exam  Review of Systems  Constitutional:  Positive for fatigue.  Respiratory:         On nasal oxygen  Psychiatric/Behavioral:  Positive for decreased concentration, dysphoric mood and sleep disturbance.     Weight (!) 339 lb (153.8 kg).There is no height or weight on file to calculate BMI.  General Appearance: Casual and on nasal oxygen  Eye Contact:  Fair  Speech:  Slow  Volume:  Decreased  Mood:  Anxious  Affect:  Congruent  Thought Process:  Descriptions of Associations: Intact  Orientation:  Full (Time, Place, and Person)  Thought Content:  Rumination  Suicidal Thoughts:  No  Homicidal Thoughts:  No  Memory:  Immediate;   Fair Recent;   Fair Remote;   Fair  Judgement:  Fair  Insight:  Present  Psychomotor Activity:  Decreased  Concentration:  Concentration: Fair and Attention Span: Fair  Recall:  Good  Fund of Knowledge:  Fair  Language:  Good  Akathisia:  No  Handed:  Right  AIMS (if indicated):     Assets:  Communication Skills Desire for Improvement Social Support  ADL's:  Intact  Cognition:  WNL  Sleep:  fair     Assessment/Plan: Major depressive disorder, recurrent episode, moderate (HCC) - Plan: Brexpiprazole (REXULTI) 3 MG TABS, doxepin (SINEQUAN) 25 MG capsule, lamoTRIgine (LAMICTAL) 150 MG tablet  Chronic post-traumatic stress disorder (PTSD) - Plan: Brexpiprazole (REXULTI) 3 MG TABS, doxepin (SINEQUAN) 25 MG capsule  I reviewed recent hospitalization, blood work and collateral information from other providers.  She reported her facial droop and  swelling is better now.  She finished a steroid course.  She liked the doxepin but is not helping her sleep all  night.  She is no longer taking mirtazapine, amitriptyline, Tranxene.  She lost weight as taking the Ozempic on a regular basis.  She like to go up on doxepin since she noticed help in sleep.  I explained polypharmacy and discussed she can try 25 mg however if she noticed concerns of excessive sedation, dizziness then she need to cut back to only 10 mg.  She agreed with the plan.  We will keep the REXULTI 3 mg daily and Lamictal 150 mg daily.  I encouraged to continue therapy with [facility].  Recommend to call us back if she has any question or any concern.  Follow-up in 3 months.   Follow Up Instructions:     I discussed the assessment and treatment plan with the patient. The patient was provided an opportunity to ask questions and all were answered. The patient agreed with the plan and demonstrated an understanding of the instructions.   The patient was advised to call back or seek an in-person evaluation if the symptoms worsen or if the condition fails to improve as anticipated.    Collaboration of Care: Other provider involved in patient's care AEB notes are available in epic to review.  Patient/Guardian was advised Release of Information must be obtained prior to any record release in order to collaborate their care with an outside provider. Patient/Guardian was advised if they have not already done so to contact the registration department to sign all necessary forms in order for [name] to release information regarding their care.   Consent: Patient/Guardian gives verbal consent for treatment and assignment of benefits for services provided during this visit. Patient/Guardian expressed understanding and agreed to proceed.     I provided 28 minutes of non face to face time during this encounter.  Note: This document was prepared by [employer] voice dictation technology and any errors  that results from this process are unintentional.    Cleotis Nipper, MD 06/05/2022

## 2022-06-05 NOTE — Telephone Encounter (Signed)
Declined tizanidine refill; this was sent 4 days ago.

## 2022-06-10 ENCOUNTER — Other Ambulatory Visit: Payer: Self-pay

## 2022-06-10 ENCOUNTER — Other Ambulatory Visit (HOSPITAL_COMMUNITY)
Admission: RE | Admit: 2022-06-10 | Discharge: 2022-06-10 | Disposition: A | Discharge status: Home / Self Care | Payer: 59 | Source: Ambulatory Visit | Attending: Family Medicine | Admitting: Family Medicine

## 2022-06-10 ENCOUNTER — Encounter: Payer: Self-pay | Admitting: Family Medicine

## 2022-06-10 ENCOUNTER — Ambulatory Visit (INDEPENDENT_AMBULATORY_CARE_PROVIDER_SITE_OTHER): Payer: 59 | Admitting: Family Medicine

## 2022-06-10 VITALS — BP 134/86 | HR 106 | Ht 71.0 in | Wt 330.8 lb

## 2022-06-10 DIAGNOSIS — Z1151 Encounter for screening for human papillomavirus (HPV): Secondary | ICD-10-CM | POA: Insufficient documentation

## 2022-06-10 DIAGNOSIS — Z01419 Encounter for gynecological examination (general) (routine) without abnormal findings: Secondary | ICD-10-CM

## 2022-06-10 DIAGNOSIS — Z124 Encounter for screening for malignant neoplasm of cervix: Secondary | ICD-10-CM | POA: Diagnosis not present

## 2022-06-10 DIAGNOSIS — N921 Excessive and frequent menstruation with irregular cycle: Secondary | ICD-10-CM

## 2022-06-10 DIAGNOSIS — R102 Pelvic and perineal pain: Secondary | ICD-10-CM | POA: Diagnosis not present

## 2022-06-10 DIAGNOSIS — Z975 Presence of (intrauterine) contraceptive device: Secondary | ICD-10-CM

## 2022-06-10 MED ORDER — DOXYCYCLINE HYCLATE 100 MG PO CAPS
100.0000 mg | ORAL_CAPSULE | Freq: Two times a day (BID) | ORAL | 0 refills | Status: DC
Start: 2022-06-10 — End: 2022-08-10

## 2022-06-10 MED ORDER — FLUCONAZOLE 150 MG PO TABS
150.0000 mg | ORAL_TABLET | Freq: Every day | ORAL | 2 refills | Status: DC
Start: 2022-06-10 — End: 2022-08-07

## 2022-06-10 MED ORDER — MEGESTROL ACETATE 40 MG PO TABS: 80.0000 mg | ORAL_TABLET | Freq: Every day | ORAL | 1 refills | Status: DC

## 2022-06-10 NOTE — Assessment & Plan Note (Signed)
[address] - on Nexplanon for contraception and bleeding control. Continue Megace prn.

## 2022-06-10 NOTE — Progress Notes (Signed)
Subjective:     Lindsay [name] is a 52 y.o. female and is here for a comprehensive physical exam. The patient reports problems - pain in pelvis. MRI showed small fibroid 1.5 x 1.7  x 1.9. She has a Nexplanon in and notes some spotting. Needs refill of her Megace for bleeding. Recent FSH is WNL.   The following portions of the patient's history were reviewed and updated as appropriate: allergies, current medications, past family history, past medical history, past social history, past surgical history, and problem list.  Review of Systems Pertinent items noted in HPI and remainder of comprehensive ROS otherwise negative.   Objective:    BP 134/86   Pulse (!) 106   Ht 5\' 11"  (1.803 m)   Wt (!) 330 lb 12.8 oz (150 kg)   LMP 06/01/2022 (Approximate) Comment: spotting for about a week  BMI 46.14 kg/m  General appearance: alert, cooperative, appears stated age, and moderately obese Head: Normocephalic, without obvious abnormality, atraumatic Neck: no adenopathy, supple, symmetrical, trachea midline, and thyroid not enlarged, symmetric, no tenderness/mass/nodules Lungs: clear to auscultation bilaterally Breasts: normal appearance, no masses or tenderness Heart: regular rate and rhythm, S1, S2 normal, no murmur, click, rub or gallop Abdomen: soft, non-tender; bowel sounds normal; no masses,  no organomegaly Pelvic: cervix normal in appearance, external genitalia normal, no cervical motion tenderness, vagina normal without discharge, and exam limited by body habitus Extremities: Homans sign is negative, no sign of DVT Pulses: 2+ and symmetric Skin: Skin color, texture, turgor normal. No rashes or lesions Lymph nodes: Cervical, supraclavicular, and axillary nodes normal. Neurologic: Grossly normal    Assessment:    GYN female exam.      Plan:   Problem List Items Addressed This Visit       Unprioritized   Pelvic pain    [address] - ? Mild endoemtritis, presumptive treatment with doxy. Rx  for Diflucan since giving Abx.      Relevant Medications   doxycycline (VIBRAMYCIN) 100 MG capsule   fluconazole (DIFLUCAN) 150 MG tablet   Menorrhagia with irregular cycle    [address] - on Nexplanon for contraception and bleeding control. Continue Megace prn.      Relevant Medications   megestrol (MEGACE) 40 MG tablet   Nexplanon in place    [address] - for bleeding control      Other Visit Diagnoses     Encounter for gynecological examination without abnormal finding    -  Primary   [phone]   Screening for malignant neoplasm of cervix       [phone]   Relevant Orders   Cytology - PAP( [facility])         See After Visit Summary for Counseling Recommendations

## 2022-06-10 NOTE — Assessment & Plan Note (Signed)
[address] - ? Mild endoemtritis, presumptive treatment with doxy. Rx for Diflucan since giving Abx.

## 2022-06-10 NOTE — Assessment & Plan Note (Signed)
[address] - for bleeding control

## 2022-06-11 LAB — CYTOLOGY - PAP
Adequacy: ABSENT
Comment: NEGATIVE
Diagnosis: NEGATIVE
High risk HPV: NEGATIVE

## 2022-06-12 ENCOUNTER — Encounter: Payer: Self-pay | Admitting: Family Medicine

## 2022-06-13 ENCOUNTER — Emergency Department (HOSPITAL_COMMUNITY): Payer: 59

## 2022-06-13 ENCOUNTER — Ambulatory Visit: Payer: 59

## 2022-06-13 ENCOUNTER — Other Ambulatory Visit: Payer: Self-pay | Admitting: Pulmonary Disease

## 2022-06-13 ENCOUNTER — Emergency Department (HOSPITAL_COMMUNITY)
Admission: EM | Admit: 2022-06-13 | Discharge: 2022-06-13 | Disposition: A | Discharge status: Home / Self Care | Payer: 59 | Attending: Emergency Medicine | Admitting: Emergency Medicine

## 2022-06-13 ENCOUNTER — Other Ambulatory Visit: Payer: Self-pay

## 2022-06-13 ENCOUNTER — Encounter (HOSPITAL_COMMUNITY): Payer: Self-pay

## 2022-06-13 DIAGNOSIS — W19XXXA Unspecified fall, initial encounter: Secondary | ICD-10-CM

## 2022-06-13 DIAGNOSIS — S8992XA Unspecified injury of left lower leg, initial encounter: Secondary | ICD-10-CM

## 2022-06-13 DIAGNOSIS — E119 Type 2 diabetes mellitus without complications: Secondary | ICD-10-CM | POA: Diagnosis not present

## 2022-06-13 DIAGNOSIS — Z79899 Other long term (current) drug therapy: Secondary | ICD-10-CM | POA: Insufficient documentation

## 2022-06-13 DIAGNOSIS — I509 Heart failure, unspecified: Secondary | ICD-10-CM | POA: Diagnosis not present

## 2022-06-13 DIAGNOSIS — W010XXA Fall on same level from slipping, tripping and stumbling without subsequent striking against object, initial encounter: Secondary | ICD-10-CM | POA: Insufficient documentation

## 2022-06-13 DIAGNOSIS — I110 Hypertensive heart disease with heart failure: Secondary | ICD-10-CM | POA: Insufficient documentation

## 2022-06-13 MED ORDER — HYDROCODONE-ACETAMINOPHEN 5-325 MG PO TABS: 1.0000 | ORAL_TABLET | Freq: Four times a day (QID) | ORAL | 0 refills | Status: DC | PRN

## 2022-06-13 MED ORDER — OXYCODONE-ACETAMINOPHEN 5-325 MG PO TABS
1.0000 | ORAL_TABLET | Freq: Once | ORAL | Status: AC
  Administered 2022-06-13: 1 via ORAL
  Filled 2022-06-13: qty 1

## 2022-06-13 NOTE — ED Provider Notes (Signed)
[place] EMERGENCY DEPARTMENT AT [facility] Provider Note   CSN: [id number] Arrival date & time: 06/13/22  4098     History  Chief Complaint  Patient presents with   injured knee    Lindsay [name] is a 52 y.o. female.  Patient with history of hypertension, IBS, CHF, fibromyalgia, morbid obesity, diabetes presents today with complaints of left knee injury. She states that same occurred originally when she slipped and fell in the rain on 5/04. She states the knee has been persistently bothering her since then and she has had increased difficulty walking since. She states that she was not seen for this injury before today. States that at 2 am she was using a stepstool to get into bed and the stool flipped and she fell and struck the right knee on the ground. She did no hit her head or loose consciousness and is not anticoagulated. States that she has not tried to walk since due to pain. She also endorses some left sided back pain but states this is mild. Denies any loss of bowel or bladder function or saddle paraesthesias. Denies any other injuries or complaints.    The history is provided by the patient. No language interpreter was used.       Home Medications Prior to Admission medications   Medication Sig Start Date End Date Taking? Authorizing Provider  ACCU-CHEK GUIDE test strip USE ONE TEST STRIP TO CHECK BLOOD SUGARS ONCE A DAY AS NEEDED 11/06/21   [name], MD  Accu-Chek Softclix Lancets lancets USE ONE LANCET TO CHECK BLOOD SUGAR ONE A DAY AS NEEDED 02/04/22   [name], MD  albuterol (PROVENTIL) (2.5 MG/3ML) 0.083% nebulizer solution TAKE 3 ML (2.5 MG TOTAL) BY NEBULIZATION EVERY 4 HOURS AS NEEDED FOR WHEEZING OR SHORTNESS OF BREATH Patient not taking: Reported on 06/10/2022 07/11/21   Virl Diamond A, MD  albuterol (VENTOLIN HFA) 108 (90 Base) MCG/ACT inhaler TAKE 2 PUFFS BY MOUTH EVERY 6 HOURS AS NEEDED FOR WHEEZE OR SHORTNESS OF BREATH Patient not  taking: Reported on 06/10/2022 05/20/22   Tomma Lightning, MD  Ascorbic Acid (VITAMIN C PO) Take 1 tablet by mouth daily.    [provider]  atorvastatin (LIPITOR) 40 MG tablet TAKE 1 TABLET BY MOUTH EVERY DAY 04/30/22   [name], MD  azelastine (ASTELIN) 0.1 % nasal spray Place 1 spray into both nostrils 2 (two) times daily. Use in each nostril as directed 01/17/22   Marrianne Mood, MD  Blood Glucose Monitoring Suppl (ACCU-CHEK GUIDE ME) w/Device KIT Dispense one device 02/16/20   [name], MD  Brexpiprazole (REXULTI) 3 MG TABS Take 1 tablet (3 mg total) by mouth daily. 06/05/22   [name], Phillips Grout, MD  Capsaicin 0.025 % PTCH Apply 1 patch topically daily as needed. 02/16/20   [name], MD  cetirizine (ZYRTEC) 10 MG tablet Take 1 tablet (10 mg total) by mouth daily. 01/09/22   [name], MD  clindamycin (CLINDAGEL) 1 % gel APPLY TO AFFECTED AREA TWICE A DAY Patient not taking: Reported on 06/10/2022 04/25/22   [name], MD  [employer] D3 25 MCG (1000 UT) capsule TAKE 1 CAPSULE BY MOUTH EVERY DAY 02/26/22   [name], MD  doxepin (SINEQUAN) 25 MG capsule Take 1 capsule (25 mg total) by mouth at bedtime. 06/05/22   [name], Phillips Grout, MD  doxycycline (VIBRAMYCIN) 100 MG capsule Take 1 capsule (100 mg total) by mouth 2 (two) times daily. 06/10/22   Tinnie Gens  S, MD  ENTRESTO 49-51 MG Take 1 tablet by mouth 2 (two) times daily. 05/31/22 08/29/22  Mercie Eon, MD  famotidine (PEPCID) 20 MG tablet TAKE 1 TABLET BY MOUTH EVERY DAY 03/06/22   [name], MD  ferrous sulfate 325 (65 FE) MG EC tablet Take 1 tablet (325 mg total) by mouth daily with breakfast. 05/24/21 05/24/22  [name], MD  fluconazole (DIFLUCAN) 150 MG tablet Take 1 tablet (150 mg total) by mouth daily. Repeat in 24 hours if needed 06/10/22   Reva Bores, MD  ipratropium (ATROVENT) 0.06 % nasal spray PLEASE SEE ATTACHED FOR DETAILED DIRECTIONS 04/14/20   [provider]   lamoTRIgine (LAMICTAL) 150 MG tablet Take 1 tablet (150 mg total) by mouth daily. 06/05/22   [name], Phillips Grout, MD  meclizine (ANTIVERT) 25 MG tablet TAKE 1 TABLET BY MOUTH THREE TIMES A DAY AS NEEDED FOR DIZZINESS 05/20/22   Mercie Eon, MD  megestrol (MEGACE) 40 MG tablet Take 2 tablets (80 mg total) by mouth daily. 06/10/22   Reva Bores, MD  metoprolol succinate (TOPROL-XL) 50 MG 24 hr tablet Take 1 tablet (50 mg total) by mouth daily. 05/23/21   [name], MD  ondansetron (ZOFRAN-ODT) 8 MG disintegrating tablet Take 1 tablet (8 mg total) by mouth every 8 (eight) hours as needed for nausea or vomiting. 06/04/22   [name], MD  Semaglutide, 2 MG/DOSE, (OZEMPIC, 2 MG/DOSE,) 8 MG/3ML SOPN INJECT 2 MG INTO THE SKIN ONCE A WEEK. 05/13/22   [name], MD  SYMBICORT 160-4.5 MCG/ACT inhaler INHALE 2 PUFFS INTO THE LUNGS IN THE MORNING AND AT BEDTIME. 03/28/22   Olalere, Adewale A, MD  tiZANidine (ZANAFLEX) 4 MG tablet TAKE 1 TABLET (4 MG TOTAL) BY MOUTH EVERY 8 (EIGHT) HOURS AS NEEDED FOR MUSCLE SPASMS 06/01/22 06/01/23  [name], MD  torsemide (DEMADEX) 20 MG tablet TAKE 2 TABLETS BY MOUTH TWICE A DAY 09/14/21   Mercie Eon, MD  traMADol (ULTRAM) 50 MG tablet Take 1 tablet (50 mg total) by mouth every 4 (four) hours as needed. 05/31/22   Mercie Eon, MD  vitamin B-12 (CYANOCOBALAMIN) 1000 MCG tablet Take 1 tablet (1,000 mcg total) by mouth daily. 05/06/17   [name], MD  Vitamin E 400 units TABS Take 400 Units by mouth daily.     [provider]      Allergies    Lisinopril    Review of Systems   Review of Systems  Musculoskeletal:  Positive for arthralgias.  All other systems reviewed and are negative.   Physical Exam Updated Vital Signs BP (!) 130/90 (BP Location: Right Arm)   Pulse 88   Temp 98.5 F (36.9 C) (Oral)   Resp 20   Ht 5\' 11"  (1.803 m)   Wt (!) 150 kg   LMP 06/01/2022 (Approximate) Comment: spotting for about a week  SpO2 100%    BMI 46.12 kg/m  Physical Exam Vitals and nursing note reviewed.  Constitutional:      General: She is not in acute distress.    Appearance: Normal appearance. She is normal weight. She is not ill-appearing, toxic-appearing or diaphoretic.  HENT:     Head: Normocephalic and atraumatic.     Comments: No Battle sign or raccoon eyes. Eyes:     Extraocular Movements: Extraocular movements intact.     Pupils: Pupils are equal, round, and reactive to light.  Cardiovascular:     Rate and Rhythm: Normal rate.  Pulmonary:  Effort: Pulmonary effort is normal. No respiratory distress.  Chest:     Chest wall: No tenderness.  Abdominal:     General: Abdomen is flat.     Palpations: Abdomen is soft.     Tenderness: There is no abdominal tenderness.  Musculoskeletal:        General: Normal range of motion.     Cervical back: Normal range of motion.     Comments: No tenderness to palpation of cervical thoracic or lumbar spine. No stepoffs, lesions, deformity, or overlying skin changes.  Trace paraspinous muscle tenderness noted on the left side.  No bruising or overlying skin changes.  TTP throughout the left knee. No obvious deformity. No obvious swelling bruising or overlying skin changes. DP and PT pulses intact and 2+. Distal sensation intact. ROM limited due to pain.   Skin:    General: Skin is warm and dry.  Neurological:     General: No focal deficit present.     Mental Status: She is alert.  Psychiatric:        Mood and Affect: Mood normal.        Behavior: Behavior normal.     ED Results / Procedures / Treatments   Labs (all labs ordered are listed, but only abnormal results are displayed) Labs Reviewed - No data to display  EKG None  Radiology DG Lumbar Spine Complete  Result Date: 06/13/2022 CLINICAL DATA:  injury EXAM: LUMBAR SPINE - COMPLETE 4+ VIEW COMPARISON:  March 18, 2022 FINDINGS: There are five non-rib bearing lumbar-type vertebral bodies. There is  levocurvature of the lumbar spine, similar in comparison to prior. No significant spondylolisthesis. There is no evidence for acute fracture or subluxation. Mild intervertebral disc space height loss at L3-4 and L4-5. Limited assessment on suboptimally positioned oblique radiographs. Sacrum and transverse processes are obscured by overlapping bowel contents. Age advanced atherosclerotic calcifications. IMPRESSION: 1. No acute fracture or subluxation of the lumbar spine. If there is persistent clinical concern for acute injury, recommend dedicated cross-sectional imaging. 2. Age advanced atherosclerotic calcifications. Electronically Signed   By: Meda Klinefelter M.D.   On: 06/13/2022 10:20   DG Knee Complete 4 Views Left  Result Date: 06/13/2022 CLINICAL DATA:  Provided history: Injury. EXAM: LEFT KNEE - COMPLETE 4+ VIEW COMPARISON:  Left knee radiographs 05/16/2022. FINDINGS: There is normal bony alignment. No evidence of acute osseous or articular abnormality. The joint spaces are maintained. IMPRESSION: No evidence of acute osseous or articular abnormality. Electronically Signed   By: Jackey Loge D.O.   On: 06/13/2022 10:18    Procedures Procedures    Medications Ordered in ED Medications  oxyCODONE-acetaminophen (PERCOCET/ROXICET) 5-325 MG per tablet 1 tablet (has no administration in time range)    ED Course/ Medical Decision Making/ A&P                             Medical Decision Making Amount and/or Complexity of Data Reviewed Radiology: ordered.  Risk Prescription drug management.   Patient presents today with complaints of mechanical fall with left knee injury. She is afebrile, non-toxic appearing, and in no acute distress with reassuring vital signs.  Physical exam reveals TTP throughout the left knee, no obvious laxity.  X-ray imaging obtained which was negative for acute findings.  I have personally reviewed and interpreted this imaging.  Radiology interpretation patient  also lumbar spine x-ray that was ordered by triage nurse prior to my evaluation did amend  CT imaging.  If clinical suspicion indicated.  Patient denies midline low back pain and her physical exam is reassuring.  She has no red flag symptoms.  She is also observed to be ambulatory with steady gait unassisted and feels ready to go home.  Therefore no indication to proceed with evaluation and CT imaging.  I did inform the patient that she could have a ligament injury that would not show up on x-ray imaging and recommend that she follow-up with orthopedics.  Referral given for same.  Will also give patient Norco for pain.  PDMP reviewed.  Patient aware not to drive or operate heavy machinery while taking this medication.  I have also given her a knee brace, offered crutches or walker which she declined.  Evaluation and diagnostic testing in the emergency department does not suggest an emergent condition requiring admission or immediate intervention beyond what has been performed at this time.  Plan for discharge with close PCP follow-up.  Patient is understanding and amenable with plan, educated on red flag symptoms that would prompt immediate return.  Patient discharged in stable condition.   Final Clinical Impression(s) / ED Diagnoses Final diagnoses:  Fall, initial encounter  Injury of left knee, initial encounter    Rx / DC Orders ED Discharge Orders          Ordered    HYDROcodone-acetaminophen (NORCO/VICODIN) 5-325 MG tablet  Every 6 hours PRN        06/13/22 1147          An After Visit Summary was printed and given to the patient.     Silva Bandy, PA-C 06/13/22 1151    Tanda Rockers A, DO 06/14/22 [phone]

## 2022-06-13 NOTE — ED Triage Notes (Signed)
Pt states she was on a step stool and it flipped over and she fell on her left knee at 0200 today. Pt c/o left knee and left lower back pain.

## 2022-06-13 NOTE — ED Notes (Signed)
Pt ambulated down the hall with no assist and feels ready to go will notify care provider and pt would like something for pain sent home with her

## 2022-06-13 NOTE — Discharge Instructions (Addendum)
As we discussed, your the ER today was reassuring for acute findings.  X-ray imaging of your knee and back did not reveal any emergent concerns.  Does not definitively rule out a tendon or ligament injury.  Given this, we have placed you in a knee brace and given you a referral to orthopedics with a number to call to schedule an appointment for follow-up.  Please call them at your earliest convenience.  In the interim, please rest, ice, compress, and elevate your leg and take Tylenol/ibuprofen as needed for pain.  I have also given you a prescription for Norco which is a narcotic pain medication for you to take as prescribed as needed for severe pain only.  Do not drive or operate heavy machinery while taking this medication as it can be sedating.  Follow-up with your primary care doctor as well.  Return if development of any new or worsening symptoms.

## 2022-06-14 ENCOUNTER — Ambulatory Visit (INDEPENDENT_AMBULATORY_CARE_PROVIDER_SITE_OTHER): Payer: 59 | Admitting: Licensed Clinical Social Worker

## 2022-06-14 DIAGNOSIS — F411 Generalized anxiety disorder: Secondary | ICD-10-CM | POA: Diagnosis not present

## 2022-06-14 DIAGNOSIS — F410 Panic disorder [episodic paroxysmal anxiety] without agoraphobia: Secondary | ICD-10-CM

## 2022-06-14 DIAGNOSIS — F4312 Post-traumatic stress disorder, chronic: Secondary | ICD-10-CM | POA: Diagnosis not present

## 2022-06-14 DIAGNOSIS — F331 Major depressive disorder, recurrent, moderate: Secondary | ICD-10-CM

## 2022-06-14 NOTE — Progress Notes (Signed)
Virtual Visit via Video Note  I connected with [name] on 06/14/22 at  8:00 AM EDT by a video enabled telemedicine application and verified that I am speaking with the correct person using two identifiers.  Location: Patient: home Provider: home office   I discussed the limitations of evaluation and management by telemedicine and the availability of in person appointments. The patient expressed understanding and agreed to proceed.  I discussed the assessment and treatment plan with the patient. The patient was provided an opportunity to ask questions and all were answered. The patient agreed with the plan and demonstrated an understanding of the instructions.   The patient was advised to call back or seek an in-person evaluation if the symptoms worsen or if the condition fails to improve as anticipated.  I provided 48 minutes of non-face-to-face time during this encounter.  THERAPIST PROGRESS NOTE  Session Time: 8:00 AM to 8:48 AM  Participation Level: Active  Behavioral Response: CasualAlertappropriate but in pain  Type of Therapy: Individual Therapy  Treatment Goals addressed: atient continue to utilize therapy for  supportive and strength-based interventions, provide treatment interventions in the context of patient continuing to seek help and cope with medical issues, anxiety, triggers, coping with mental health symptoms, trauma, utilize therapy as a way for patient to focus on working through current stressors that also helps to distract from pain  ProgressTowards Goals: Progressing-patient utilizing session to help cope with medical stressors processing her thoughts and feelings in session helping with coping  Interventions: Solution Focused, Strength-based, Supportive, and Other: Coping  Summary: Maziah Mortimore is a 52 y.o. female who presents with in a lot of pain. Hit knee on May 4, May 10 May 16-stepped on  stepladder flipped knee hit the ground went to the ER yesterday  morning x-rays didn't show anything. Need an MRI for that have to go to orthopedic doctor. They also could mean go to a rehab [facility] nursing home. Patient goes to PCP sees a doctor from the practice and hopes they will order the MRI. Reviewed history in January went to pain management did MRI torn meniscus found out what is  wrong but doctor said can't help leaving. Gave 8 pills. Doctor afterwards tried different medicines didn't work. Doesn't know what it is now because it is more painful than torn meniscus. ER gave her 8 Vicodin pills. By tonight won't have anymore sees a doctor on Monday not her doctor. Gave her a brace didn't want a walker has a roller one at home thought too big for where she lives. Hope see through MRI what is going on. Trying to stretch the Vicodin until Monday. Doctor increased sleep medicine. Doxepin. Went to sleep last night. Vicodin makes pain tolerable as long as don't walk doesn't plan on walking on it.  Mom's birthday left early weren't feeling good came home get her food and then hit her knee for the first time. Therapist asked about her family has 3 siblings three sisters. Cousin raised together call him brother. Marylene Land comes by really close with her close with siblings patient is the second youngest. Really good relationship with Mom never had bad parents one of the luckiest parts of her life. Patient feels one of the best parts of her life were parents. Dad passed when patient was 23 years old. Still hard. Dad passed April 03 1985. Mom and Dad was 40 years apart. Dad was landscaper for millionaires. Mom used to do linens food prep, house cleaning for rich people.  Asked what day looks like what she does for fun. Watch movie probably as long as don't get on foot. Used to watch Numbers. Watching Dawna Part and [place] . Has to change companies PT.  Talked about her daughter patient used to write to superintendent school for [employer] for her daughter got her special programming  extra testing time, go to a different classroom, also could stay in the same class she liked that more than changing classroom. It was IEP. Kept writing to let superintendent know what was going on when people not doing what supposed to. A lady from [employer] knew system kept on them made them sweat. Advocate for daughter and it was stressful. Every phone call knew was going to be bad. Dozing in class behavioral health meds, magic socks different color socks distraction daughter would fight them. Patient says over stupid things and therapist agree. Only has one daughter.   Conversation touched on different subjects think helpful for mood patient shared more about her parents both of [name] having, and we are very lucky to have the parents we did.  Therapist was glad to hear that about patient's life..  Shared more about parents occupations more about siblings who she gets along with.  Of note last weekend was mom's birthday and patient enjoyed herself though had to leave early.  Focused on medical issues that are ongoing and understand patient's frustration because do not get resolved.  Currently pain is the issue for her and significantly more because of hitting her knee.  On the positive side doctor gave her medication actually is helping her sleep and therapist noted this is big she really is not in good shape when she does not sleep.  Explored what is fun for her patient knows to stay off her knee and what shows also positive saying primary care on Monday hopefully order an MRI.  Summed up medical issues in this way has to find out what is going on knee that is going to address the central issue for her right now.  Therapist provided space and support for patient to talk about thoughts and feelings in session      Suicidal/Homicidal: No  Plan: Return again in 2 weeks.2.Assess very helpful for patient to get supportive interventions processed thoughts and feelings in session  Diagnosis: Major depressive disorder,  recurrent, moderate, generalized anxiety disorder, chronic PTSD, panic attacks  Collaboration of Care: Other none needed  Patient/Guardian was advised Release of Information must be obtained prior to any record release in order to collaborate their care with an outside provider. Patient/Guardian was advised if they have not already done so to contact the registration department to sign all necessary forms in order for [name] to release information regarding their care.   Consent: Patient/Guardian gives verbal consent for treatment and assignment of benefits for services provided during this visit. Patient/Guardian expressed understanding and agreed to proceed.   Coolidge Breeze, LCSW 06/14/2022

## 2022-06-15 ENCOUNTER — Telehealth: Payer: Self-pay | Admitting: Internal Medicine

## 2022-06-15 ENCOUNTER — Other Ambulatory Visit: Payer: Self-pay | Admitting: Internal Medicine

## 2022-06-15 DIAGNOSIS — M797 Fibromyalgia: Secondary | ICD-10-CM

## 2022-06-15 DIAGNOSIS — M25562 Pain in left knee: Secondary | ICD-10-CM

## 2022-06-15 MED ORDER — HYDROCODONE-ACETAMINOPHEN 5-325 MG PO TABS
1.0000 | ORAL_TABLET | Freq: Four times a day (QID) | ORAL | 0 refills | Status: DC | PRN
Start: 2022-06-15 — End: 2022-06-17

## 2022-06-15 NOTE — Telephone Encounter (Addendum)
Page received from after hours pager due to knee pain.  Lindsay [name] fell on 5/16 when she was stepping on stool to get to bed. She was evaluated in the ED that day as she was having difficulty putting weight on her knee. Recent fall 5/4 as well. Knee xray left showed no acute findings. Lumbar spin xr showed no fracture or subluxation. She was given 8 tablets of hydrocodine- acetaminophen 5-325 mg. She has also been taking Tylenol 650 mg e every 6 hrs for the the last few days and icing her knee.  She was given a brace in the ED but declined crutches or a walker.  She took last tablet of Norco yesterday and stopped 1 taking torsemide since she is having trouble getting to the bathroom due to her knee. She has not noticed weight gain in last few days or edema.  P: 4 tablets of Norco 5-325 mg sent to pharmacy Precautions given on amount of tylenol she could take with Norco. Total acetaminophen must be less than 4 g. I encouraged her to use crutches or walker to help her get to bathroom over next 2 days until she is seen in clinic.  F/u with Dr. Cyndie Chime 5/20 at 8:45

## 2022-06-17 ENCOUNTER — Encounter: Payer: Self-pay | Admitting: Student

## 2022-06-17 ENCOUNTER — Ambulatory Visit (INDEPENDENT_AMBULATORY_CARE_PROVIDER_SITE_OTHER): Payer: 59 | Admitting: Student

## 2022-06-17 VITALS — BP 124/81 | HR 109 | Temp 99.3°F | Ht 71.0 in | Wt 335.0 lb

## 2022-06-17 DIAGNOSIS — M25562 Pain in left knee: Secondary | ICD-10-CM

## 2022-06-17 MED ORDER — HYDROCODONE-ACETAMINOPHEN 5-325 MG PO TABS
1.0000 | ORAL_TABLET | Freq: Four times a day (QID) | ORAL | 0 refills | Status: DC | PRN
Start: 2022-06-17 — End: 2022-07-10

## 2022-06-17 MED ORDER — IBUPROFEN 800 MG PO TABS
800.0000 mg | ORAL_TABLET | Freq: Four times a day (QID) | ORAL | 0 refills | Status: DC | PRN
Start: 2022-06-17 — End: 2022-06-21

## 2022-06-17 NOTE — Addendum Note (Signed)
Addended byDoran Stabler on: 06/17/2022 11:52 AM   Modules accepted: Orders

## 2022-06-17 NOTE — Patient Instructions (Addendum)
Ms. Tippie,  I am sorry that your knee is hurting.   Please come to [facility] urgent care for evaluation.  I prescribed 3 days of hydrocodone-acetaminophen 5-325 mg.  You can take 1 tablet every 6 hours as needed.  Please do not take tramadol while you are taking Norco.  Please continue taking ibuprofen 800 mg every 6 hours for the next 3-5 days.  Please return in 1-2 weeks for reevaluation  Dr. Cyndie Chime

## 2022-06-17 NOTE — Assessment & Plan Note (Addendum)
Patient presents to clinic for acute left knee pain after a fall on 5/16.  Patient slipped and fell on the anterior region of her left knee.  She denies any joint dislocation.  Patient was seen in the ED on the same day.  Left knee x-ray did not show any bony abnormality or joint space disease.  There is no acute injury seen on the lumbar imaging either.  Patient was given a few doses of Norco which she states that was helpful.  She also using heat, ice, ibuprofen 800 mg q6h x 1 day, with minimal relief.  She is has a knee brace but still have difficulty getting up on her own without assistance from her CNA.  This left knee pain has impaired her daily activity greatly.  She has tenderness to palpation of left knee diffusely, worse at the medial, lateral and posterior side.  Mild joint effusion palpated.  No erythema.  Limited range of motion due to pain.  No obvious joint laxity.  There is no bony abnormality on x-ray.  She could have a ligamental strain or injury.  Patient states that she would like to treat her pain conservatively without surgery if possible.  She is indeed at a high risk for complication with her BMI and comorbidities.  No indication for steroid injection in the setting of acute traumatic injury.  We will send a referral to an orthopedic for urgent evaluation.  In the meantime we will control her pain with 3 days of Norco 5-325 mg every 6 hours as needed.  Advised patient to not take tramadol while she is on Norco.  Continue ibuprofen, ice, diclofenac gel, knee brace and rest.  Patient will follow in 1-2 weeks for reevaluation.

## 2022-06-17 NOTE — Progress Notes (Signed)
CC: acute left knee pain  HPI:  [name] is a 52 y.o. living with HTN, COPD, OSA presents to the clinic for evaluation of her acute left knee pain from a fall on 5/16.  Please see problem based charting for detail  Past Medical History:  Diagnosis Date   Acid reflux    Anemia    Iron Def   Anorexia    CHF (congestive heart failure) (HCC)    Chronic kidney disease    Nephrotic syndrome   Colon polyp 2009   Depression with anxiety    Edema leg    Fibroid tumor 04/2022   Fibromyalgia    Hemorrhoids    Hidradenitis suppurativa    Hypertension    IBS (irritable bowel syndrome)    Low back pain    Migraines    Morbidly obese (HCC)    Neuromuscular disorder (HCC)    fibromyalgia   Neuropathy    Panic attacks    Polyp of vocal cord or larynx    Stroke (HCC)    Tonsil pain    Review of Systems:  per HPI  Physical Exam:  Vitals:   06/17/22 0836  BP: 124/81  Pulse: (!) 109  Temp: 99.3 F (37.4 C)  TempSrc: Oral  SpO2: 99%   Physical Exam Constitutional:      General: She is not in acute distress.    Appearance: She is not ill-appearing.  HENT:     Head: Normocephalic.  Eyes:     General:        Right eye: No discharge.        Left eye: No discharge.     Conjunctiva/sclera: Conjunctivae normal.  Cardiovascular:     Rate and Rhythm: Regular rhythm. Tachycardia present.  Pulmonary:     Effort: Pulmonary effort is normal. No respiratory distress.     Breath sounds: Normal breath sounds.  Musculoskeletal:     Cervical back: Normal range of motion.     Comments: Left knee tender to palpation diffusely, worse at the medial, lateral and posterior side.  Mild joint effusion palpated.  No erythema.  Limited range of motion due to pain.  No obvious joint laxity.  Skin:    General: Skin is warm.  Neurological:     Mental Status: She is alert. Mental status is at baseline.      Assessment & Plan:   See Encounters Tab for problem based  charting.  Acute pain of left knee Patient presents to clinic for acute left knee pain after a fall on 5/16.  Patient slipped and fell on the anterior region of her left knee.  She denies any joint dislocation.  Patient was seen in the ED on the same day.  Left knee x-ray did not show any bony abnormality or joint space disease.  There is no acute injury seen on the lumbar imaging either.  Patient was given a few doses of Norco which she states that was helpful.  She also using heat, ice, ibuprofen 800 mg q6h x 1 day, with minimal relief.  She is has a knee brace but still have difficulty getting up on her own without assistance from her CNA.  This left knee pain has impaired her daily activity greatly.  She has tenderness to palpation of left knee diffusely, worse at the medial, lateral and posterior side.  Mild joint effusion palpated.  No erythema.  Limited range of motion due to pain.  No obvious joint laxity.  There is no bony abnormality on x-ray.  She could have a ligamental strain or injury.  Patient states that she would like to treat her pain conservatively without surgery if possible.  She is indeed at a high risk for complication with her BMI and comorbidities.  No indication for steroid injection in the setting of acute traumatic injury.  We will send a referral to an orthopedic for urgent evaluation.  In the meantime we will control her pain with 3 days of Norco 5-325 mg every 6 hours as needed.  Advised patient to not take tramadol while she is on Norco.  Continue ibuprofen, ice, diclofenac gel, knee brace and rest.  Patient will follow in 1-2 weeks for reevaluation.   Patient discussed with Dr. Mayford Knife

## 2022-06-18 NOTE — Progress Notes (Signed)
Internal Medicine Clinic Attending  Case discussed with Dr. Nguyen  At the time of the visit.  We reviewed the resident's history and exam and pertinent patient test results.  I agree with the assessment, diagnosis, and plan of care documented in the resident's note. 

## 2022-06-21 ENCOUNTER — Other Ambulatory Visit: Payer: Self-pay | Admitting: Internal Medicine

## 2022-06-21 ENCOUNTER — Other Ambulatory Visit: Payer: Self-pay | Admitting: *Deleted

## 2022-06-21 ENCOUNTER — Ambulatory Visit: Payer: 59 | Admitting: Physician Assistant

## 2022-06-21 DIAGNOSIS — M25562 Pain in left knee: Secondary | ICD-10-CM

## 2022-06-21 MED ORDER — IBUPROFEN 800 MG PO TABS
800.0000 mg | ORAL_TABLET | Freq: Four times a day (QID) | ORAL | 0 refills | Status: AC | PRN
Start: 2022-06-21 — End: 2022-06-26

## 2022-06-21 NOTE — Telephone Encounter (Signed)
Refilled declined. Three days for acute pain was appropriate, she can resume taking her tramadol.

## 2022-06-21 NOTE — Telephone Encounter (Signed)
Pt has not gotten a Phone call from [facility] about and appointment.  Pt states she was only given a 3 day supply Pt is now out of the following medication  HYDROcodone-acetaminophen (NORCO) 5-325 MG tablet ibuprofen (ADVIL) 800 MG tablet  [id number] - [place], [place] - [address]

## 2022-06-21 NOTE — Telephone Encounter (Signed)
Pt was called and informed her doctor refused Hydrocodone refill; pt is requesting refill on Ibuprofen.

## 2022-06-21 NOTE — Telephone Encounter (Signed)
Called [facility] to Verify they have Rec'd the Pt's Referral on 06/17/2022.  The pt's Referral was rec'd.  The 1st ava appt will be 07/03/2022 @ 8:15 am.  Called the pt and notified her of the sch appt.  Pt verbalized understanding of the appointment and will arrange her transportation accordingly.

## 2022-06-21 NOTE — Telephone Encounter (Signed)
Pt called / informed of Ibuprofen refill.

## 2022-06-23 ENCOUNTER — Telehealth: Payer: Self-pay | Admitting: Student

## 2022-06-23 MED ORDER — BENZONATATE 100 MG PO CAPS: 100.0000 mg | ORAL_CAPSULE | Freq: Three times a day (TID) | ORAL | 0 refills | Status: DC | PRN

## 2022-06-23 NOTE — Telephone Encounter (Signed)
Received on-call page from Ms. Lamar Benes. Reports productive cough x3d, had mild fever of 100.62F yesterday. No dyspnea, change in PO intake, mental status, abdominal pain, hemoptysis, dizziness. She is requesting something for her cough.   Discussed with her she likely has viral URI with her symptoms. Will send in Tessalon for cough. Gave precautions to return.  - [facility] given  Evlyn Kanner, MD Internal Medicine PGY-3 Pager: [phone]

## 2022-06-24 ENCOUNTER — Other Ambulatory Visit: Payer: Self-pay | Admitting: Internal Medicine

## 2022-06-24 DIAGNOSIS — E1121 Type 2 diabetes mellitus with diabetic nephropathy: Secondary | ICD-10-CM

## 2022-06-26 ENCOUNTER — Other Ambulatory Visit: Payer: Self-pay | Admitting: Pulmonary Disease

## 2022-06-26 ENCOUNTER — Other Ambulatory Visit: Payer: Self-pay | Admitting: Internal Medicine

## 2022-06-26 ENCOUNTER — Other Ambulatory Visit (HOSPITAL_COMMUNITY): Payer: Self-pay | Admitting: Psychiatry

## 2022-06-26 DIAGNOSIS — F331 Major depressive disorder, recurrent, moderate: Secondary | ICD-10-CM

## 2022-06-26 DIAGNOSIS — G629 Polyneuropathy, unspecified: Secondary | ICD-10-CM

## 2022-06-26 DIAGNOSIS — L732 Hidradenitis suppurativa: Secondary | ICD-10-CM

## 2022-06-26 DIAGNOSIS — M797 Fibromyalgia: Secondary | ICD-10-CM

## 2022-06-26 DIAGNOSIS — R0982 Postnasal drip: Secondary | ICD-10-CM

## 2022-06-26 NOTE — Telephone Encounter (Signed)
Next appt scheduled 6/19 with PCP. 

## 2022-06-27 ENCOUNTER — Telehealth: Payer: Self-pay

## 2022-06-27 DIAGNOSIS — M25562 Pain in left knee: Secondary | ICD-10-CM

## 2022-06-27 NOTE — Telephone Encounter (Signed)
Requesting to speak with a nurse about medication. Please call back.  

## 2022-06-27 NOTE — Telephone Encounter (Signed)
Returned call to patient. States she needed referral to [facility], could not just walk in. Appt is on 6/5. She is requesting refill on pain med (Norco 5-325) to get her to that appt. States this makes left knee pain "tolerable." Otherwise, pain is 9/10. States no relief with tramadol. Out of ibuprofen x 2 days. States won't have money to buy OTC ibuprofen till 6/3 when she gets her check. Has been using diclofenac gel, knee brace, resting and elevating leg.

## 2022-06-27 NOTE — Telephone Encounter (Signed)
The answer is still no; if she has money to buy Norco then she should be able to afford the ibuprofen. Continue opioids is not appropriate. I placed the referral for ortho.

## 2022-06-28 ENCOUNTER — Ambulatory Visit (INDEPENDENT_AMBULATORY_CARE_PROVIDER_SITE_OTHER): Payer: 59 | Admitting: Licensed Clinical Social Worker

## 2022-06-28 ENCOUNTER — Other Ambulatory Visit (HOSPITAL_COMMUNITY): Payer: Self-pay | Admitting: Psychiatry

## 2022-06-28 DIAGNOSIS — F331 Major depressive disorder, recurrent, moderate: Secondary | ICD-10-CM

## 2022-06-28 DIAGNOSIS — F410 Panic disorder [episodic paroxysmal anxiety] without agoraphobia: Secondary | ICD-10-CM

## 2022-06-28 DIAGNOSIS — F411 Generalized anxiety disorder: Secondary | ICD-10-CM | POA: Diagnosis not present

## 2022-06-28 DIAGNOSIS — F4312 Post-traumatic stress disorder, chronic: Secondary | ICD-10-CM

## 2022-06-28 MED ORDER — NAPROXEN 375 MG PO TABS
375.0000 mg | ORAL_TABLET | Freq: Two times a day (BID) | ORAL | 0 refills | Status: DC | PRN
Start: 2022-06-28 — End: 2022-07-19

## 2022-06-28 NOTE — Telephone Encounter (Signed)
Patient called she is requesting a call back from you.
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
## 2022-06-28 NOTE — Telephone Encounter (Signed)
I called patient back.  Patient still has persistent left knee pain that is not relieved with tramadol.  We discussed that additional Norco would not be appropriate.  Patient agrees with a prescription of naproxen for 1 week until she sees her orthopedic.

## 2022-06-28 NOTE — Addendum Note (Signed)
Addended byDoran Stabler on: 06/28/2022 11:45 AM   Modules accepted: Orders

## 2022-07-02 ENCOUNTER — Ambulatory Visit: Payer: 59

## 2022-07-02 ENCOUNTER — Telehealth (HOSPITAL_COMMUNITY): Payer: Self-pay | Admitting: *Deleted

## 2022-07-02 NOTE — Telephone Encounter (Signed)
Please contact pharmacy and call prescription until she is due for next refills.

## 2022-07-02 NOTE — Telephone Encounter (Signed)
Pt called stating that she "lost", or left, all her medications at the hospital recently. Pt was seen at [facility] ED on 06/15/22 for a fall. Pt states that she went back to ED after d/c to see if the meds could be located without success. Pt is requesting only a #90 refill of the Doxepin 25 mg QHS. Last e-scribed on 06/05/22 for #30 with 2 refills. Pt says that she is trying to just fill what she can afford a little bit at a time. Please review and advise.

## 2022-07-03 ENCOUNTER — Other Ambulatory Visit (HOSPITAL_COMMUNITY): Payer: Self-pay | Admitting: *Deleted

## 2022-07-03 ENCOUNTER — Other Ambulatory Visit: Payer: Self-pay | Admitting: Internal Medicine

## 2022-07-03 DIAGNOSIS — F4312 Post-traumatic stress disorder, chronic: Secondary | ICD-10-CM

## 2022-07-03 DIAGNOSIS — F331 Major depressive disorder, recurrent, moderate: Secondary | ICD-10-CM

## 2022-07-03 DIAGNOSIS — E119 Type 2 diabetes mellitus without complications: Secondary | ICD-10-CM

## 2022-07-03 MED ORDER — DOXEPIN HCL 25 MG PO CAPS
25.0000 mg | ORAL_CAPSULE | Freq: Every day | ORAL | 0 refills | Status: DC
Start: 2022-07-03 — End: 2022-07-22

## 2022-07-03 NOTE — Telephone Encounter (Signed)
#  30 sent to pt pharmacy. Pt advised.

## 2022-07-04 ENCOUNTER — Encounter: Payer: Self-pay | Admitting: Internal Medicine

## 2022-07-10 ENCOUNTER — Ambulatory Visit (INDEPENDENT_AMBULATORY_CARE_PROVIDER_SITE_OTHER): Payer: 59 | Admitting: Internal Medicine

## 2022-07-10 ENCOUNTER — Other Ambulatory Visit: Payer: Self-pay

## 2022-07-10 ENCOUNTER — Encounter: Payer: Self-pay | Admitting: Internal Medicine

## 2022-07-10 VITALS — BP 96/60 | HR 93 | Temp 98.7°F | Ht 70.0 in | Wt 332.9 lb

## 2022-07-10 DIAGNOSIS — I10 Essential (primary) hypertension: Secondary | ICD-10-CM

## 2022-07-10 DIAGNOSIS — Z6841 Body Mass Index (BMI) 40.0 and over, adult: Secondary | ICD-10-CM

## 2022-07-10 DIAGNOSIS — F1190 Opioid use, unspecified, uncomplicated: Secondary | ICD-10-CM

## 2022-07-10 DIAGNOSIS — E119 Type 2 diabetes mellitus without complications: Secondary | ICD-10-CM

## 2022-07-10 DIAGNOSIS — E0800 Diabetes mellitus due to underlying condition with hyperosmolarity without nonketotic hyperglycemic-hyperosmolar coma (NKHHC): Secondary | ICD-10-CM

## 2022-07-10 DIAGNOSIS — E611 Iron deficiency: Secondary | ICD-10-CM

## 2022-07-10 DIAGNOSIS — M797 Fibromyalgia: Secondary | ICD-10-CM

## 2022-07-10 DIAGNOSIS — Z7985 Long-term (current) use of injectable non-insulin antidiabetic drugs: Secondary | ICD-10-CM

## 2022-07-10 DIAGNOSIS — N921 Excessive and frequent menstruation with irregular cycle: Secondary | ICD-10-CM

## 2022-07-10 LAB — POCT GLYCOSYLATED HEMOGLOBIN (HGB A1C): Hemoglobin A1C: 5.6 % (ref 4.0–5.6)

## 2022-07-10 LAB — GLUCOSE, CAPILLARY: Glucose-Capillary: 105 mg/dL — ABNORMAL HIGH (ref 70–99)

## 2022-07-10 MED ORDER — TRAMADOL HCL 50 MG PO TABS
50.0000 mg | ORAL_TABLET | ORAL | 0 refills | Status: DC | PRN
Start: 2022-07-10 — End: 2022-07-10

## 2022-07-10 MED ORDER — TRAMADOL HCL 50 MG PO TABS
50.0000 mg | ORAL_TABLET | ORAL | 0 refills | Status: DC | PRN
Start: 2022-07-10 — End: 2022-08-07

## 2022-07-10 MED ORDER — KETOROLAC TROMETHAMINE 60 MG/2ML IM SOLN
60.0000 mg | Freq: Once | INTRAMUSCULAR | Status: AC
Start: 2022-07-10 — End: 2022-07-10
  Administered 2022-07-10: 60 mg via INTRAMUSCULAR

## 2022-07-10 NOTE — Progress Notes (Signed)
Copy of Controlled Medication Treatment Agreement given to pt.

## 2022-07-10 NOTE — Assessment & Plan Note (Signed)
Chronic and well controlled. CMP last month WNL.

## 2022-07-10 NOTE — Patient Instructions (Signed)
Lindsay [name],  It was a pleasure to see you. Keep up the great work with your weight loss. [facility]. Please follow up with me again in 3 months.   If you have any questions or concerns, call our clinic at [phone] or after hours call [phone] and ask for the internal medicine resident on call.   Thank you!  Dr. Reece Agar

## 2022-07-10 NOTE — Assessment & Plan Note (Addendum)
Currently on every other day iron supplements. Reports worsening menorrhagia with irregular cycle, suspect she is perimenopausal. Currently has a nexplanon in place. Discussed that this can make abnormal uterine bleeding worse in some people. Advise she follow up with her OBGYN and consider IUD if bleeding continues to be bothersome. Rechecking CBC and ferritin today.   ADDENDUM: Unable to collect labs today; she is a difficult stick. Will obtain at f/u.

## 2022-07-10 NOTE — Assessment & Plan Note (Signed)
Well controlled on ozempic. Hgb A1c today is 5.6.

## 2022-07-10 NOTE — Progress Notes (Signed)
Subjective:   Patient ID: Lindsay [name] female   DOB: 06/06/1970 52 y.o.   MRN: [id number]  HPI: [name] is a 52 y.o. female with past medical history outlined below here for follow up of her chronic pain. For further details of today's visit, please refer to the assessment and plan below.   Past Medical History:  Diagnosis Date   Acid reflux    Anemia    Iron Def   Anorexia    CHF (congestive heart failure) (HCC)    Chronic kidney disease    Nephrotic syndrome   Colon polyp 2009   Depression with anxiety    Edema leg    Fibroid tumor 04/2022   Fibromyalgia    Hemorrhoids    Hidradenitis suppurativa    Hypertension    IBS (irritable bowel syndrome)    Low back pain    Migraines    Morbidly obese (HCC)    Neuromuscular disorder (HCC)    fibromyalgia   Neuropathy    Panic attacks    Polyp of vocal cord or larynx    Stroke (HCC)    Tonsil pain    Current Outpatient Medications  Medication Sig Dispense Refill   ACCU-CHEK GUIDE test strip USE ONE TEST STRIP TO CHECK BLOOD SUGARS ONCE A DAY AS NEEDED 100 strip 1   Accu-Chek Softclix Lancets lancets USE ONE LANCET TO CHECK BLOOD SUGAR ONE A DAY AS NEEDED 100 each 1   albuterol (PROVENTIL) (2.5 MG/3ML) 0.083% nebulizer solution TAKE 3 ML (2.5 MG TOTAL) BY NEBULIZATION EVERY 4 HOURS AS NEEDED FOR WHEEZING OR SHORTNESS OF BREATH (Patient not taking: Reported on 06/10/2022) 150 mL 0   albuterol (VENTOLIN HFA) 108 (90 Base) MCG/ACT inhaler TAKE 2 PUFFS BY MOUTH EVERY 6 HOURS AS NEEDED FOR WHEEZE OR SHORTNESS OF BREATH (Patient not taking: Reported on 06/10/2022) 8.5 each 0   Ascorbic Acid (VITAMIN C PO) Take 1 tablet by mouth daily.     atorvastatin (LIPITOR) 40 MG tablet TAKE 1 TABLET BY MOUTH EVERY DAY 90 tablet 3   azelastine (ASTELIN) 0.1 % nasal spray Place 1 spray into both nostrils 2 (two) times daily. Use in each nostril as directed 30 mL 12   benzonatate (TESSALON PERLES) 100 MG capsule Take 1 capsule (100 mg total)  by mouth 3 (three) times daily as needed for cough. 20 capsule 0   Blood Glucose Monitoring Suppl (ACCU-CHEK GUIDE ME) w/Device KIT Dispense one device 1 kit 0   Brexpiprazole (REXULTI) 3 MG TABS Take 1 tablet (3 mg total) by mouth daily. 90 tablet 0   Capsaicin 0.025 % PTCH Apply 1 patch topically daily as needed. 10 patch 9   cetirizine (ZYRTEC) 10 MG tablet Take 1 tablet (10 mg total) by mouth daily. 90 tablet 1   clindamycin (CLINDAGEL) 1 % gel APPLY TO AFFECTED AREA TWICE A DAY 30 g 0   [facility] D3 25 MCG (1000 UT) capsule TAKE 1 CAPSULE BY MOUTH EVERY DAY 90 capsule 1   doxepin (SINEQUAN) 25 MG capsule Take 1 capsule (25 mg total) by mouth at bedtime. 30 capsule 0   doxycycline (VIBRAMYCIN) 100 MG capsule Take 1 capsule (100 mg total) by mouth 2 (two) times daily. 20 capsule 0   ENTRESTO 49-51 MG Take 1 tablet by mouth 2 (two) times daily. 60 tablet 2   famotidine (PEPCID) 20 MG tablet TAKE 1 TABLET BY MOUTH EVERY DAY 90 tablet 1   ferrous sulfate 325 (65 FE) MG EC  tablet Take 1 tablet (325 mg total) by mouth daily with breakfast. 90 tablet 1   fluconazole (DIFLUCAN) 150 MG tablet Take 1 tablet (150 mg total) by mouth daily. Repeat in 24 hours if needed 2 tablet 2   fluticasone (FLONASE) 50 MCG/ACT nasal spray INSTILL 1 SPRAY INTO BOTH NOSTRILS DAILY 16 mL 2   ipratropium (ATROVENT) 0.06 % nasal spray PLEASE SEE ATTACHED FOR DETAILED DIRECTIONS     lamoTRIgine (LAMICTAL) 150 MG tablet Take 1 tablet (150 mg total) by mouth daily. 90 tablet 0   lidocaine (LIDODERM) 5 % PLACE 2 PATCHES ONTO THE SKIN DAILY. REMOVE & DISCARD PATCH WITHIN 12 HOURS OR AS DIRECTED BY MD 60 patch 2   meclizine (ANTIVERT) 25 MG tablet TAKE 1 TABLET BY MOUTH THREE TIMES A DAY AS NEEDED FOR DIZZINESS 90 tablet 2   megestrol (MEGACE) 40 MG tablet Take 2 tablets (80 mg total) by mouth daily. 180 tablet 1   metoprolol succinate (TOPROL-XL) 50 MG 24 hr tablet Take 1 tablet (50 mg total) by mouth daily. 90 tablet 3   ondansetron  (ZOFRAN-ODT) 8 MG disintegrating tablet Take 1 tablet (8 mg total) by mouth every 8 (eight) hours as needed for nausea or vomiting. 90 tablet 1   Semaglutide, 1 MG/DOSE, (OZEMPIC, 1 MG/DOSE,) 4 MG/3ML SOPN INJECT 1 MG INTO THE SKIN ONE TIME PER WEEK 3 mL 2   Semaglutide, 2 MG/DOSE, (OZEMPIC, 2 MG/DOSE,) 8 MG/3ML SOPN INJECT 2 MG INTO THE SKIN ONCE A WEEK. 9 mL 1   SYMBICORT 160-4.5 MCG/ACT inhaler INHALE 2 PUFFS INTO THE LUNGS IN THE MORNING AND AT BEDTIME. 10.2 each 0   tiZANidine (ZANAFLEX) 4 MG tablet TAKE 1 TABLET (4 MG TOTAL) BY MOUTH EVERY 8 (EIGHT) HOURS AS NEEDED FOR MUSCLE SPASMS 90 tablet 5   torsemide (DEMADEX) 20 MG tablet TAKE 2 TABLETS BY MOUTH TWICE A DAY 360 tablet 1   traMADol (ULTRAM) 50 MG tablet Take 1 tablet (50 mg total) by mouth every 4 (four) hours as needed. 180 tablet 0   vitamin B-12 (CYANOCOBALAMIN) 1000 MCG tablet Take 1 tablet (1,000 mcg total) by mouth daily. 30 tablet 3   Vitamin E 400 units TABS Take 400 Units by mouth daily.      Current Facility-Administered Medications  Medication Dose Route Frequency Provider Last Rate Last Admin   ketorolac (TORADOL) injection 60 mg  60 mg Intramuscular Once [name], MD       Family History  Problem Relation Age of Onset   Diabetes Mother    Heart disease Mother        valve leak   Anxiety disorder Mother    Depression Mother    High blood pressure Mother    Kidney failure Mother    Cancer Father        prostate   Heart disease Father    Learning disabilities Sister    Depression Sister    Depression Sister    Anxiety disorder Sister    Colon cancer Neg Hx    Social History   Socioeconomic History   Marital status: Single    Spouse name: Not on file   Number of children: 1   Years of education: 11   Highest education level: Not on file  Occupational History   Occupation: Disabled  Tobacco Use   Smoking status: Former    Packs/day: 0.10    Years: 25.00    Additional pack years: 0.00     Total  pack years: 2.50    Types: Cigarettes    Quit date: 05/24/2019    Years since quitting: 3.1   Smokeless tobacco: Never   Tobacco comments:    quit in April.  Vaping Use   Vaping Use: Never used  Substance and Sexual Activity   Alcohol use: Not Currently    Alcohol/week: 0.0 standard drinks of alcohol    Comment: none sine 05/2018   Drug use: Not Currently    Frequency: 3.0 times per week    Types: Marijuana   Sexual activity: Yes    Birth control/protection: Implant    Comment: placed in March 2024  Other Topics Concern   Not on file  Social History Narrative   Lives at home w/ her mother and daughter   Right-handed   Caffeine: about 3 Cokes per week   Social Determinants of Health   Financial Resource Strain: High Risk (03/18/2022)   Overall Financial Resource Strain (CARDIA)    Difficulty of Paying Living Expenses: Hard  Food Insecurity: Patient Declined (06/10/2022)   Hunger Vital Sign    Worried About Running Out of Food in the Last Year: Patient declined    Ran Out of Food in the Last Year: Patient declined  Recent Concern: Food Insecurity - Food Insecurity Present (03/18/2022)   Hunger Vital Sign    Worried About Running Out of Food in the Last Year: Often true    Ran Out of Food in the Last Year: Sometimes true  Transportation Needs: Patient Declined (06/10/2022)   PRAPARE - Administrator, Civil Service (Medical): Patient declined    Lack of Transportation (Non-Medical): Patient declined  Recent Concern: Transportation Needs - Unmet Transportation Needs (03/18/2022)   PRAPARE - Transportation    Lack of Transportation (Medical): Yes    Lack of Transportation (Non-Medical): Yes  Physical Activity: Insufficiently Active (08/14/2021)   Exercise Vital Sign    Days of Exercise per Week: 1 day    Minutes of Exercise per Session: 40 min  Stress: No Stress Concern Present (08/14/2021)   [employer] of Occupational Health - Occupational Stress  Questionnaire    Feeling of Stress : Only a little  Social Connections: Moderately Isolated (03/18/2022)   Social Connection and Isolation Panel [NHANES]    Frequency of Communication with Friends and Family: More than three times a week    Frequency of Social Gatherings with Friends and Family: Never    Attends Religious Services: 1 to 4 times per year    Active Member of [employer] or Organizations: No    Attends Banker Meetings: Never    Marital Status: Never married     Objective:  Physical Exam:  Vitals:   07/10/22 0907  BP: 96/60  Pulse: 93  Temp: 98.7 F (37.1 C)  TempSrc: Oral  SpO2: 100%  Weight: (!) 332 lb 14.4 oz (151 kg)  Height: 5\' 10"  (1.778 m)    Constitutional: Obese, NAD Cardiovascular: RRR, no m/r/g Pulmonary/Chest: Clear bilaterally, normal effort Extremities: warm, non pitting edema   Assessment & Plan:   Fibromyalgia Patient has very difficult to treat fibromyalgia, on chronic tramadol 50 mg q4h PRN. We are also working on weight loss and with PT. We renewed her controlled substance contract with me today and collected Utox. Reminded patient that she is not to obtain controlled substances from other providers. Sent 3 Rx to her pharmacy. Gave 1 x IM toradol today per request.   Essential hypertension Chronic and  well controlled. CMP last month WNL.   Iron deficiency Currently on every other day iron supplements. Reports worsening menorrhagia with irregular cycle, suspect she is perimenopausal. Currently has a nexplanon in place. Discussed that this can make abnormal uterine bleeding worse in some people. Advise she follow up with her OBGYN and consider IUD if bleeding continues to be bothersome. Rechecking CBC and ferritin today.   Morbid obesity (HCC) Continues to do well on ozempic 2 mg weekly. Weight today is 332.9lbs, down from 370 lbs one year ago (37 lbs total). Looks like insurance does not cover the wegovy 2.4 dose so will continue  with the 2 mg dose.  Diabetes (HCC) Well controlled on ozempic. Hgb A1c today is 5.6.

## 2022-07-10 NOTE — Assessment & Plan Note (Signed)
Continues to do well on ozempic 2 mg weekly. Weight today is 332.9lbs, down from 370 lbs one year ago (37 lbs total). Looks like insurance does not cover the wegovy 2.4 dose so will continue with the 2 mg dose.

## 2022-07-10 NOTE — Assessment & Plan Note (Addendum)
Patient has very difficult to treat fibromyalgia, on chronic tramadol 50 mg q4h PRN. We are also working on weight loss and with PT. We renewed her controlled substance contract with me today and collected Utox. Reminded patient that she is not to obtain controlled substances from other providers. Sent 3 Rx to her pharmacy. Gave 1 x IM toradol today per request.

## 2022-07-12 ENCOUNTER — Ambulatory Visit (INDEPENDENT_AMBULATORY_CARE_PROVIDER_SITE_OTHER): Payer: 59 | Admitting: Licensed Clinical Social Worker

## 2022-07-12 DIAGNOSIS — F410 Panic disorder [episodic paroxysmal anxiety] without agoraphobia: Secondary | ICD-10-CM | POA: Diagnosis not present

## 2022-07-12 DIAGNOSIS — F411 Generalized anxiety disorder: Secondary | ICD-10-CM | POA: Diagnosis not present

## 2022-07-12 DIAGNOSIS — F4312 Post-traumatic stress disorder, chronic: Secondary | ICD-10-CM | POA: Diagnosis not present

## 2022-07-12 DIAGNOSIS — F331 Major depressive disorder, recurrent, moderate: Secondary | ICD-10-CM | POA: Diagnosis not present

## 2022-07-12 NOTE — Progress Notes (Signed)
Virtual Visit via Video Note  I connected with [name] on 07/12/22 at  8:00 AM EDT by a video enabled telemedicine application and verified that I am speaking with the correct person using two identifiers.  Location: Patient: home Provider: home office   I discussed the limitations of evaluation and management by telemedicine and the availability of in person appointments. The patient expressed understanding and agreed to proceed.   I discussed the assessment and treatment plan with the patient. The patient was provided an opportunity to ask questions and all were answered. The patient agreed with the plan and demonstrated an understanding of the instructions.   The patient was advised to call back or seek an in-person evaluation if the symptoms worsen or if the condition fails to improve as anticipated.  I provided 45 minutes of non-face-to-face time during this encounter.  THERAPIST PROGRESS NOTE  Session Time: 8:00 AM to 8:45 AM  Participation Level: Active  Behavioral Response: CasualAlertAnxious, Dysphoric, Irritable, in general, appropriate and able to laugh in session  Type of Therapy: Individual Therapy  Treatment Goals addressed:  patient continue to utilize therapy for  supportive and strength-based interventions, provide treatment interventions in the context of patient continuing to seek help and cope with medical issues, anxiety, triggers, coping with mental health symptoms, trauma, utilize therapy as a way for patient to focus on working through current stressors that also helps to distract from pain  ProgressTowards Goals: Progressing-patient processing thoughts and feelings to help with coping therapist additionally providing strength-based supportive interventions to help her in managing her stressors  Interventions: Solution Focused, Strength-based, Supportive, and Other: coping  Summary: Lindsay [name] is a 52 y.o. female who presents with in a lot of pain.  Went to doctor told her the "same shit". Gives her the same meds diagnosed with fibromyalgia something had for years knows not a flare up. Doctor told her she has to lose weight can't have surgery until lose weight, ortho doctor gave her a pill form of Voltaren. Doctor says flare up of torn meniscus or arthritis.  Patient wants them to do more testing and therapist agreed this would be good to see what was going on.  When lay back a real irritant a tickle in throat when lay back salvia in mouth or throat throat aggravated. Asked about muscle relaxant if allergic doctor said would be allergic reaction said if tongue swells up. Patient thinks still could be an allergic reaction even if tongue not swollen. Sometimes "pisses" her off haven't seen in 6 months. Therapist points out diagnosis without testing. Patient slowed down psych meds to see why having involuntary trembling had before knows the one doing it is Lamictal. Doctor said too much cold medicine patient says that is not it hasn't been taking that for months. Before lost her Lamictal and the trembling stopped. Psychiatrist asks her every time about side effects. Still taking meds just slowed down to see which one it was taking. Doxapram, Rexulti, Lamictal.  Going to call about it. Therapist noted after seeing the shaking she needs to call and she agrees.  See the doctor and nothing changed. Ankle feels really tight makes her feel numb looks swollen. Wednesday asked about swelling she poked leg with finger moved back and then went on to the next thing. Told her frustrated but hearing the same thing. Can't write anymore don't know who she is. Forgets everything tell her 15 minutes later don't know what happened.   Therapist noted one good  thing losing weight. Tried of blaming everything on the weight. Not sure why not giving MRI. Before doctor said torn meniscus heal on its own. If athlete they would be getting treatment it is about the money. Tired of it and  "pissed". What is the next step for patient is prayer asks what God has in store, is this plan? Therapist noted going so slow and patient agrees "stupid ass process" and she is suffering. Always pain no relief with meds.  Therapist asked what her day is like she is in bed gets up to go to bathroom. Sometimes will cook 1-2 x a month. Take 1-2 weeks to recovery.  Suggested a movie for therapist "What about Nadine Counts" therapist loves the movie. Just watched "Good Grief". Comedy is good medicine. Remember uncle and mom play [place] not allowed to listen to him. Watches [place] and Son almost every night. Dream nightmare woke up daughter was running toward her kept calling her name when awake asking herself what it is going on? Last week calling Mom out of her dreams.  Some intense emotions patient agrees and shared about significant events lately. A guy think highly of he passed dated sister he is gone. A girl from the neighborhood did drugs, like a prostitute didn't look at her like that passed. "Ladybug" passed 20 worked in [employer] office. Had a blood clot in her leg. Didn't do anything when went to hospital. Nephew fluid in brain body and blood clots only 20. Had toe taken off turned black said it was dead if didn't catch it would spread over body. Went for a second opinion doctor said shouldn't have done that. Grandmother says God is a healer he doesn't want to hear that for him it is "bullshit". Talked about patients' spirituality sometimes don't know what faith it. Spirituality still helps.   Daughter using natural remedies has allergic reactions scares patient look like a stroke and a seizure patient ok with natural remedies. She is stable with her mental health. Doctor prescribing meds even though things allergic prescribe meds to help with allergic medicine. Her daughter following more natural remedies.  Patient showed thumb and leg involuntary movements. Going to call doctor about that. PCP said reacting to  what is in cold medicine. Don't want him to take  Doxepin helps her to sleep didn't say anything before doctor said cold medicine.  Patient plans to watch what about Nadine Counts therapist noted very good medicine.  Therapist provided space and support for patient to talk about thoughts and feelings in session.  At the same time validating patient on frustration of navigating medical system basically hearing the same thing and patient feels not addressing the problem sufficiently.  Patient both tired and frustrated noted therapy very helpful when dealing with these type of stressors.  Therapist reframed noted if they say she is having she continues to lose weight although not a great solution eventually the will do more patient agrees with this.  Patient shared about comities which had therapist laughing therapist pointed out this is a good medicine for her after session she is going to watch another comedy shared about favorite comedian's as a positive topic for session.  Assess laughing is one of the best interventions with patient.  Patient shared about nightmares therapist noted probably working through intense emotions patient went on to talk about different deaths connecting nightmares with these events.  Talked about various topics about her daughter, explored things to keep her busy.  Therapist provided active  listening open questions supportive interventions Suicidal/Homicidal: No  Plan: Return again in 2 weeks.2.ssess very helpful for patient to get supportive interventions processed thoughts and feelings in session  Diagnosis: Major depressive disorder, recurrent, moderate, generalized anxiety disorder, chronic PTSD, panic attacks   Collaboration of Care: Other none needed  Patient/Guardian was advised Release of Information must be obtained prior to any record release in order to collaborate their care with an outside provider. Patient/Guardian was advised if they have not already done so to contact  the registration department to sign all necessary forms in order for [name] to release information regarding their care.   Consent: Patient/Guardian gives verbal consent for treatment and assignment of benefits for services provided during this visit. Patient/Guardian expressed understanding and agreed to proceed.   Coolidge Breeze, LCSW 07/12/2022

## 2022-07-13 LAB — TOXASSURE SELECT,+ANTIDEPR,UR

## 2022-07-14 ENCOUNTER — Telehealth: Payer: Self-pay | Admitting: Student

## 2022-07-14 DIAGNOSIS — M797 Fibromyalgia: Secondary | ICD-10-CM

## 2022-07-14 MED ORDER — MELOXICAM 7.5 MG PO TABS
7.5000 mg | ORAL_TABLET | Freq: Every day | ORAL | 0 refills | Status: AC
Start: 2022-07-14 — End: 2022-07-19

## 2022-07-14 NOTE — Telephone Encounter (Signed)
Received page from [name]. Reports acute on chronic pain in her bilateral legs. She has been taking tramadol as prescribed. States she typically will take OTC NSAID as needed (up to two Naproxen daily). She reports her last dose was last Thursday or Friday. She currently does not have any money to buy any and would like for [name] to send some in.  Reviewed chart, significant history of NSAID use. Multiple telephone visits discussing importance of minimizing NSAID use given heart failure and use of ARB. Discussed with Lindsay [name] that I will prescribe short course, but she should only be taking it as needed and not scheduled. ED precautions given as well.   - Meloxicam 7.5mg  daily x5d  Lindsay Kanner, MD Internal Medicine PGY-3

## 2022-07-15 ENCOUNTER — Encounter: Payer: Self-pay | Admitting: Internal Medicine

## 2022-07-17 ENCOUNTER — Encounter: Payer: 59 | Admitting: Internal Medicine

## 2022-07-18 ENCOUNTER — Telehealth: Payer: Self-pay | Admitting: Internal Medicine

## 2022-07-18 ENCOUNTER — Other Ambulatory Visit: Payer: Self-pay | Admitting: Student

## 2022-07-18 DIAGNOSIS — M25562 Pain in left knee: Secondary | ICD-10-CM

## 2022-07-18 NOTE — Telephone Encounter (Signed)
Pt states she stepped on a ladder yesterday and hurt both of her legs and is requesting  - ibuprofen (ADVIL) 800 MG tablet    [id number] [place],  - [address] (Ph: [phone])

## 2022-07-19 ENCOUNTER — Other Ambulatory Visit: Payer: Self-pay | Admitting: Internal Medicine

## 2022-07-19 DIAGNOSIS — M25562 Pain in left knee: Secondary | ICD-10-CM

## 2022-07-21 ENCOUNTER — Telehealth: Payer: Self-pay | Admitting: Internal Medicine

## 2022-07-21 ENCOUNTER — Other Ambulatory Visit (HOSPITAL_COMMUNITY): Payer: Self-pay | Admitting: Psychiatry

## 2022-07-21 DIAGNOSIS — F4312 Post-traumatic stress disorder, chronic: Secondary | ICD-10-CM

## 2022-07-21 DIAGNOSIS — F331 Major depressive disorder, recurrent, moderate: Secondary | ICD-10-CM

## 2022-07-21 NOTE — Telephone Encounter (Signed)
Paged by operator as patient was requesting a return phone call. She says that she thinks she is having a flare of her arthritis and wants a prescription for meloxicam sent in. I saw that Friday 06/21 a course of naproxen was sent in by Dr. Antony Contras and asked if she had tried this medication. She said she took it yesterday morning but it did not help. She has also tried tylenol.   I explained the risk of taking multiple NSAIDs together and in general in the setting of her medical conditions. I advised that she take this medication more than one time to give it more time to declare itself as far as it's efficacy. She agreed to this plan and will contact the clinic tomorrow if despite taking the medication as advised, she is not feeling relief.  Champ Mungo, DO Internal Medicine PGY-2

## 2022-07-22 ENCOUNTER — Other Ambulatory Visit (HOSPITAL_COMMUNITY): Payer: Self-pay

## 2022-07-22 DIAGNOSIS — F4312 Post-traumatic stress disorder, chronic: Secondary | ICD-10-CM

## 2022-07-22 DIAGNOSIS — F331 Major depressive disorder, recurrent, moderate: Secondary | ICD-10-CM

## 2022-07-22 MED ORDER — DOXEPIN HCL 25 MG PO CAPS
25.0000 mg | ORAL_CAPSULE | Freq: Every day | ORAL | 1 refills | Status: DC
Start: 2022-07-22 — End: 2022-08-05

## 2022-07-22 NOTE — Telephone Encounter (Signed)
Stopped by another physician

## 2022-07-23 ENCOUNTER — Other Ambulatory Visit: Payer: Self-pay | Admitting: Internal Medicine

## 2022-07-23 ENCOUNTER — Encounter: Payer: Self-pay | Admitting: Family Medicine

## 2022-07-23 ENCOUNTER — Encounter: Payer: Self-pay | Admitting: Internal Medicine

## 2022-07-23 DIAGNOSIS — B9689 Other specified bacterial agents as the cause of diseases classified elsewhere: Secondary | ICD-10-CM

## 2022-07-23 DIAGNOSIS — M797 Fibromyalgia: Secondary | ICD-10-CM

## 2022-07-23 MED ORDER — METRONIDAZOLE 500 MG PO TABS
500.0000 mg | ORAL_TABLET | Freq: Two times a day (BID) | ORAL | 0 refills | Status: DC
Start: 2022-07-23 — End: 2022-08-10

## 2022-07-23 MED ORDER — MELOXICAM 7.5 MG PO TABS
ORAL_TABLET | ORAL | 0 refills | Status: DC
Start: 2022-07-23 — End: 2022-08-08

## 2022-07-24 ENCOUNTER — Other Ambulatory Visit: Payer: Self-pay | Admitting: Internal Medicine

## 2022-07-24 DIAGNOSIS — L732 Hidradenitis suppurativa: Secondary | ICD-10-CM

## 2022-07-24 NOTE — Telephone Encounter (Signed)
Next appt scheduled  7/10 with PCP. 

## 2022-07-26 ENCOUNTER — Ambulatory Visit (INDEPENDENT_AMBULATORY_CARE_PROVIDER_SITE_OTHER): Payer: 59 | Admitting: Licensed Clinical Social Worker

## 2022-07-26 ENCOUNTER — Encounter: Payer: Self-pay | Admitting: Internal Medicine

## 2022-07-26 ENCOUNTER — Other Ambulatory Visit: Payer: Self-pay | Admitting: Internal Medicine

## 2022-07-26 DIAGNOSIS — F4312 Post-traumatic stress disorder, chronic: Secondary | ICD-10-CM | POA: Diagnosis not present

## 2022-07-26 DIAGNOSIS — F331 Major depressive disorder, recurrent, moderate: Secondary | ICD-10-CM | POA: Diagnosis not present

## 2022-07-26 DIAGNOSIS — A084 Viral intestinal infection, unspecified: Secondary | ICD-10-CM

## 2022-07-26 DIAGNOSIS — F410 Panic disorder [episodic paroxysmal anxiety] without agoraphobia: Secondary | ICD-10-CM

## 2022-07-26 DIAGNOSIS — F411 Generalized anxiety disorder: Secondary | ICD-10-CM

## 2022-07-26 NOTE — Progress Notes (Signed)
Virtual Visit via Video Note  I connected with Lindsay [name] on 07/26/22 at  8:00 AM EDT by a video enabled telemedicine application and verified that I am speaking with the correct person using two identifiers.  Location: Patient: home Provider: home office   I discussed the limitations of evaluation and management by telemedicine and the availability of in person appointments. The patient expressed understanding and agreed to proceed.  I discussed the assessment and treatment plan with the patient. The patient was provided an opportunity to ask questions and all were answered. The patient agreed with the plan and demonstrated an understanding of the instructions.   The patient was advised to call back or seek an in-person evaluation if the symptoms worsen or if the condition fails to improve as anticipated.  I provided 45 minutes of non-face-to-face time during this encounter.  THERAPIST PROGRESS NOTE  Session Time: 8:00 AM to 8:45 AM  Participation Level: Active  Behavioral Response: CasualAlertAnxious and Dysphoric-worsened by medical issues mood euthymic in session  Type of Therapy: Individual Therapy  Treatment Goals addressed: patient continue to utilize therapy for  supportive and strength-based interventions, provide treatment interventions in the context of patient continuing to seek help and cope with medical issues, anxiety, triggers, coping with mental health symptoms, trauma, utilize therapy as a way for patient to focus on working through current stressors that also helps to distract from pain  ProgressTowards Goals: Progressing-therapy continues to help patient cope with stressors as well as patient benefiting from supportive and strength-based interventions  Interventions: Solution Focused, Strength-based, Supportive, and Other: coping  Summary: Lindsay [name] is a 52 y.o. female who presents with about the same feels sick to her stomach. Takes Zofran or takes pickle  juice. The doctor says something about breathing reason why won't change the pain pills. Off the Tranxene she still doesn't want to change it. Doxapram 2-3 months. She Still giving her Tramadol every four hours. Lamictal takes to not have a panic attack in the morning. Patient reviewed where pain is with therapist  from toes over knees to thighs on fire and something pulling doctors said could be flare of arthritis, fibromyalgia and/or flare of mensicus. Emergency ortho said that so what doctor thinks. Called the office the other day they were "shitty" they said be honest and that she hurt yourself patient said no and doesn't know what was wrong for the lady to say that. Wanted NSAIDS couldn't afford over the counter. Told doctor how treated through My Chart. Wanted doctor to prescribe because couldn't afford over the counter. First given Naproxen but wanted Meloxicam because first one not working. Doctor didn't want to give because on NSAIDS already patient went through My Chart and got what she needed. Helps with burning feeling and muscles that feel bent out of shape doesn't stop the pain. All people in doctor's office "shitty and rude".  PCP alopecia on July 10. Patient has had for a long time.    Twitching finger two days later after session and feet jumpy like nervous is gone. Lucky break and doesn't know why but bring up to doctor when she sees. . Therapist talked about how kids in 20's can be difficult, patient's daughter, Lindsay [name], (patient made up name comes from mom's name Lindsay [name]. "Shows her ass", She cusses a lot. Around the house cussing and hollering. Has her boyfriend and stays with him a lot.  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  In sessions reinforced helpfulness of humor  Therapist was validating with patient's medical issues for different reasons the frustrations of simply dealing with office staff who  are rude, doctors who do not seem to help her the way she would like, even about dealing with medical issues and the struggle therapist was validating.  Is nice when talking the patient is even with struggles able to laugh about a few things just how life is and what it puts you through.  Reviewed other topics including stress of her daughter, watching comities helping with mood.  Therapist pointed out her relationship is strong which helps most significantly therapeutically and patient agrees therapist provided space and support for patient to talk about thoughts and feelings in session. Suicidal/Homicidal: No  Plan: Return again in  4 weeks.2.Assess very helpful for patient to get supportive interventions processed thoughts and feelings in session  Diagnosis: Major depressive disorder, recurrent, moderate, generalized anxiety disorder, chronic PTSD, panic attacks  Collaboration of Care: Other none needed  Patient/Guardian was advised Release of Information must be obtained prior to any record release in order to collaborate their care with an outside provider. Patient/Guardian was advised if they have not already done so to contact the registration department to sign all necessary forms in order for [name] to release information regarding their care.   Consent: Patient/Guardian gives verbal consent for treatment and assignment of benefits for services provided during this visit. Patient/Guardian expressed understanding and agreed to proceed.   Lindsay [name], [place] 07/26/2022

## 2022-07-26 NOTE — Telephone Encounter (Signed)
Refill request just sent in separate encounter.

## 2022-07-27 ENCOUNTER — Other Ambulatory Visit: Payer: Self-pay | Admitting: Student

## 2022-07-27 DIAGNOSIS — A084 Viral intestinal infection, unspecified: Secondary | ICD-10-CM

## 2022-07-27 MED ORDER — ONDANSETRON 8 MG PO TBDP
8.0000 mg | ORAL_TABLET | Freq: Three times a day (TID) | ORAL | 1 refills | Status: DC | PRN
Start: 2022-07-27 — End: 2022-08-11

## 2022-07-27 NOTE — Progress Notes (Signed)
Received after hours call from patient.  Patient requesting refill for Zofran for nausea and vomiting. She had messaged clinic yesterday about this. Last Rx refill on 06/04/2022 and she has been on Zofran before. Will [facility] [address] [facility]. Patient also noted decreased taste after starting Flagyl for BV. No other side effects or acute concerns at this time. Discussed with patient that taking Flagyl can lead to altered taste sensations. Patient verbalizes understanding and will follow up as needed.   Rana Snare, DO 07/27/22 4:56 PM

## 2022-07-30 ENCOUNTER — Ambulatory Visit: Payer: 59

## 2022-07-31 ENCOUNTER — Encounter: Payer: 59 | Admitting: Internal Medicine

## 2022-08-03 ENCOUNTER — Other Ambulatory Visit: Payer: Self-pay | Admitting: Nurse Practitioner

## 2022-08-03 ENCOUNTER — Telehealth: Payer: Self-pay | Admitting: Nurse Practitioner

## 2022-08-03 MED ORDER — DICYCLOMINE HCL 10 MG PO CAPS: 10.0000 mg | ORAL_CAPSULE | Freq: Three times a day (TID) | ORAL | 1 refills | Status: DC | PRN

## 2022-08-03 NOTE — Telephone Encounter (Signed)
Patient answering service with complaints of abdominal pain and mucous in stool. Asking for Bentyl and Hyoscyamine. She telle me she was seen in our office in 2023 but I am unable to find any visit since 2020 ??  She has cancelled several visits.   I am happy to call in some Bentyl. She will call office Monday to make an appt

## 2022-08-05 ENCOUNTER — Other Ambulatory Visit: Payer: Self-pay | Admitting: *Deleted

## 2022-08-05 ENCOUNTER — Other Ambulatory Visit (HOSPITAL_COMMUNITY): Payer: Self-pay | Admitting: *Deleted

## 2022-08-05 ENCOUNTER — Telehealth (HOSPITAL_COMMUNITY): Payer: Self-pay | Admitting: *Deleted

## 2022-08-05 DIAGNOSIS — I5023 Acute on chronic systolic (congestive) heart failure: Secondary | ICD-10-CM

## 2022-08-05 DIAGNOSIS — E1121 Type 2 diabetes mellitus with diabetic nephropathy: Secondary | ICD-10-CM

## 2022-08-05 DIAGNOSIS — F4312 Post-traumatic stress disorder, chronic: Secondary | ICD-10-CM

## 2022-08-05 DIAGNOSIS — R42 Dizziness and giddiness: Secondary | ICD-10-CM

## 2022-08-05 DIAGNOSIS — K219 Gastro-esophageal reflux disease without esophagitis: Secondary | ICD-10-CM

## 2022-08-05 DIAGNOSIS — R0982 Postnasal drip: Secondary | ICD-10-CM

## 2022-08-05 DIAGNOSIS — F1190 Opioid use, unspecified, uncomplicated: Secondary | ICD-10-CM

## 2022-08-05 DIAGNOSIS — F331 Major depressive disorder, recurrent, moderate: Secondary | ICD-10-CM

## 2022-08-05 MED ORDER — DOXEPIN HCL 25 MG PO CAPS
25.0000 mg | ORAL_CAPSULE | Freq: Every day | ORAL | 0 refills | Status: DC
Start: 2022-08-05 — End: 2022-09-24

## 2022-08-05 MED ORDER — LAMOTRIGINE 150 MG PO TABS
150.0000 mg | ORAL_TABLET | Freq: Every day | ORAL | 0 refills | Status: DC
Start: 2022-08-05 — End: 2022-09-24

## 2022-08-05 MED ORDER — REXULTI 3 MG PO TABS
3.0000 mg | ORAL_TABLET | Freq: Every day | ORAL | 0 refills | Status: DC
Start: 2022-08-05 — End: 2022-09-24

## 2022-08-05 MED ORDER — MECLIZINE HCL 25 MG PO TABS
25.0000 mg | ORAL_TABLET | Freq: Three times a day (TID) | ORAL | 2 refills | Status: DC | PRN
Start: 2022-08-05 — End: 2022-11-15

## 2022-08-05 MED ORDER — OZEMPIC (2 MG/DOSE) 8 MG/3ML ~~LOC~~ SOPN
2.0000 mg | PEN_INJECTOR | SUBCUTANEOUS | 1 refills | Status: DC
Start: 2022-08-05 — End: 2023-04-14

## 2022-08-05 MED ORDER — TORSEMIDE 20 MG PO TABS: 40.0000 mg | ORAL_TABLET | Freq: Two times a day (BID) | ORAL | 1 refills | Status: DC

## 2022-08-05 MED ORDER — CETIRIZINE HCL 10 MG PO TABS: 10.0000 mg | ORAL_TABLET | Freq: Every day | ORAL | 1 refills | Status: DC

## 2022-08-05 MED ORDER — FAMOTIDINE 20 MG PO TABS: 20.0000 mg | ORAL_TABLET | Freq: Every day | ORAL | 1 refills | Status: DC

## 2022-08-05 NOTE — Telephone Encounter (Signed)
Patient called in stating she was visiting [place] when she received notice of family emergency at home. She left all her medications in [facility]. States she called the place she was staying and they cannot find them. Went through entire med list. Some meds have refills available. [facility]. She will contact appropriate Providers for other refills. Will forward 6 refill requests to PCP. She is advised to alert insurance company to situation so they can provide overrides if needed.

## 2022-08-05 NOTE — Telephone Encounter (Signed)
Done. Pt advised.

## 2022-08-05 NOTE — Telephone Encounter (Signed)
Pt called stating that she "lost" her medications when she was recently at [employer] and is requesting early refills. Pt requesting refills of the Doxepin 25 mg at bedtime (this med was refilled early on 07/22/22 due to "lost in [facility]"), Lamictal 150 mg QD #90 filled 06/05/22, and the Rexulti 3 mg QD, also filled 5//8/24 (date of last visit). Pt is aware that insurance may not pay for scripts. F/u scheduled for 09/04/22. Please review and advise.

## 2022-08-05 NOTE — Telephone Encounter (Signed)
Please call enough medication which should be enough for her next appointment.  No extra refills.

## 2022-08-05 NOTE — Telephone Encounter (Addendum)
Refilled all meds except for the tramadol - unfortunately we do not replace lost or stolen opioid prescriptions (this is an [facility] policy and should be in her controlled substance contract). Thank you.

## 2022-08-07 ENCOUNTER — Ambulatory Visit (INDEPENDENT_AMBULATORY_CARE_PROVIDER_SITE_OTHER): Payer: 59 | Admitting: Internal Medicine

## 2022-08-07 ENCOUNTER — Encounter: Payer: Self-pay | Admitting: Internal Medicine

## 2022-08-07 ENCOUNTER — Other Ambulatory Visit: Payer: Self-pay

## 2022-08-07 VITALS — BP 129/85 | HR 104 | Temp 99.0°F | Ht 70.0 in | Wt 322.7 lb

## 2022-08-07 DIAGNOSIS — K529 Noninfective gastroenteritis and colitis, unspecified: Secondary | ICD-10-CM

## 2022-08-07 DIAGNOSIS — M797 Fibromyalgia: Secondary | ICD-10-CM

## 2022-08-07 DIAGNOSIS — F1190 Opioid use, unspecified, uncomplicated: Secondary | ICD-10-CM | POA: Diagnosis not present

## 2022-08-07 DIAGNOSIS — L659 Nonscarring hair loss, unspecified: Secondary | ICD-10-CM | POA: Diagnosis not present

## 2022-08-07 MED ORDER — TRAMADOL HCL 50 MG PO TABS
50.0000 mg | ORAL_TABLET | ORAL | 0 refills | Status: DC | PRN
Start: 2022-08-07 — End: 2022-08-10

## 2022-08-07 NOTE — Patient Instructions (Signed)
Ms. Connett,  I will call you with your lab results. Please stay hydrated and continue taking zofran as you need it for nausea. I have placed a referral to a dermatologist for your hair loss.  If you have any questions or concerns, call our clinic at [phone] or after hours call [phone] and ask for the internal medicine resident on call.   Thank you!  Dr. Reece Agar

## 2022-08-07 NOTE — Progress Notes (Signed)
Subjective:   Patient ID: Lindsay [name] female   DOB: [birth year] 52 y.o.   MRN: [id number]  HPI: [name] is a 52 y.o. female with past medical history outlined below here for symptoms of acute onset abdominal pain with associated nausea. She reports symptoms of sharp abdominal pain started Friday morning around 10 AM.  She describes the pain as being epigastric and suprapubic.  She denies diarrhea but is passing mucus in her stool at least once a day which she showed me a picture of.  She reports nausea but has not been vomiting, she has been taking Zofran PRN.  No blood in her stool. She reports a fever yesterday of 101. Denies known sick contacts.   Of note, she recently left all of her prescription medications at her OBGYN office. I sent refills in for all of her medications expect for her tramadol which she filled a 30 day supply for on 6/26. Reports she took her last tramadol pill on Friday.   Past Medical History:  Diagnosis Date   Acid reflux    Anemia    Iron Def   Anorexia    CHF (congestive heart failure) (HCC)    Chronic kidney disease    Nephrotic syndrome   Colon polyp 2009   Depression with anxiety    Edema leg    Fibroid tumor 04/2022   Fibromyalgia    Hemorrhoids    Hidradenitis suppurativa    Hypertension    IBS (irritable bowel syndrome)    Low back pain    Migraines    Morbidly obese (HCC)    Neuromuscular disorder (HCC)    fibromyalgia   Neuropathy    Panic attacks    Polyp of vocal cord or larynx    Stroke (HCC)    Tonsil pain    Current Outpatient Medications  Medication Sig Dispense Refill   dicyclomine (BENTYL) 10 MG capsule Take 1 capsule (10 mg total) by mouth 3 (three) times daily as needed for spasms (abdominal pain). 30 capsule 1   ACCU-CHEK GUIDE test strip USE ONE TEST STRIP TO CHECK BLOOD SUGARS ONCE A DAY AS NEEDED 100 strip 1   Accu-Chek Softclix Lancets lancets USE ONE LANCET TO CHECK BLOOD SUGAR ONE A DAY AS NEEDED 100 each 1    albuterol (PROVENTIL) (2.5 MG/3ML) 0.083% nebulizer solution TAKE 3 ML (2.5 MG TOTAL) BY NEBULIZATION EVERY 4 HOURS AS NEEDED FOR WHEEZING OR SHORTNESS OF BREATH (Patient not taking: Reported on 06/10/2022) 150 mL 0   albuterol (VENTOLIN HFA) 108 (90 Base) MCG/ACT inhaler TAKE 2 PUFFS BY MOUTH EVERY 6 HOURS AS NEEDED FOR WHEEZE OR SHORTNESS OF BREATH (Patient not taking: Reported on 06/10/2022) 8.5 each 0   Ascorbic Acid (VITAMIN C PO) Take 1 tablet by mouth daily.     atorvastatin (LIPITOR) 40 MG tablet TAKE 1 TABLET BY MOUTH EVERY DAY 90 tablet 3   azelastine (ASTELIN) 0.1 % nasal spray Place 1 spray into both nostrils 2 (two) times daily. Use in each nostril as directed 30 mL 12   benzonatate (TESSALON PERLES) 100 MG capsule Take 1 capsule (100 mg total) by mouth 3 (three) times daily as needed for cough. 20 capsule 0   Blood Glucose Monitoring Suppl (ACCU-CHEK GUIDE ME) w/Device KIT Dispense one device 1 kit 0   Brexpiprazole (REXULTI) 3 MG TABS Take 1 tablet (3 mg total) by mouth daily. 30 tablet 0   Capsaicin 0.025 % PTCH Apply 1 patch topically daily as needed.  10 patch 9   cetirizine (ZYRTEC) 10 MG tablet Take 1 tablet (10 mg total) by mouth daily. 90 tablet 1   clindamycin (CLINDAGEL) 1 % gel APPLY TO AFFECTED AREA TWICE A DAY 30 g 0   [facility] D3 25 MCG (1000 UT) capsule TAKE 1 CAPSULE BY MOUTH EVERY DAY 90 capsule 1   doxepin (SINEQUAN) 25 MG capsule Take 1 capsule (25 mg total) by mouth at bedtime. 30 capsule 0   doxycycline (VIBRAMYCIN) 100 MG capsule Take 1 capsule (100 mg total) by mouth 2 (two) times daily. 20 capsule 0   ENTRESTO 49-51 MG Take 1 tablet by mouth 2 (two) times daily. 60 tablet 2   famotidine (PEPCID) 20 MG tablet Take 1 tablet (20 mg total) by mouth daily. 90 tablet 1   ferrous sulfate 325 (65 FE) MG EC tablet Take 1 tablet (325 mg total) by mouth daily with breakfast. 90 tablet 1   fluticasone (FLONASE) 50 MCG/ACT nasal spray INSTILL 1 SPRAY INTO BOTH NOSTRILS DAILY 16 mL 2    ipratropium (ATROVENT) 0.06 % nasal spray PLEASE SEE ATTACHED FOR DETAILED DIRECTIONS     lamoTRIgine (LAMICTAL) 150 MG tablet Take 1 tablet (150 mg total) by mouth daily. 90 tablet 0   lidocaine (LIDODERM) 5 % PLACE 2 PATCHES ONTO THE SKIN DAILY. REMOVE & DISCARD PATCH WITHIN 12 HOURS OR AS DIRECTED BY MD 60 patch 2   meclizine (ANTIVERT) 25 MG tablet Take 1 tablet (25 mg total) by mouth 3 (three) times daily as needed for dizziness. 90 tablet 2   megestrol (MEGACE) 40 MG tablet Take 2 tablets (80 mg total) by mouth daily. 180 tablet 1   meloxicam (MOBIC) 7.5 MG tablet Take 1-2 tablets (7.5-15 mg) by mouth daily as needed. 14 tablet 0   metoprolol succinate (TOPROL-XL) 50 MG 24 hr tablet Take 1 tablet (50 mg total) by mouth daily. 90 tablet 3   metroNIDAZOLE (FLAGYL) 500 MG tablet Take 1 tablet (500 mg total) by mouth 2 (two) times daily. 14 tablet 0   ondansetron (ZOFRAN-ODT) 8 MG disintegrating tablet Take 1 tablet (8 mg total) by mouth every 8 (eight) hours as needed for nausea or vomiting. 90 tablet 1   Semaglutide, 2 MG/DOSE, (OZEMPIC, 2 MG/DOSE,) 8 MG/3ML SOPN Inject 2 mg into the skin once a week. 9 mL 1   SYMBICORT 160-4.5 MCG/ACT inhaler INHALE 2 PUFFS INTO THE LUNGS IN THE MORNING AND AT BEDTIME. 10.2 each 0   tiZANidine (ZANAFLEX) 4 MG tablet TAKE 1 TABLET (4 MG TOTAL) BY MOUTH EVERY 8 (EIGHT) HOURS AS NEEDED FOR MUSCLE SPASMS 90 tablet 5   torsemide (DEMADEX) 20 MG tablet Take 2 tablets (40 mg total) by mouth 2 (two) times daily. 360 tablet 1   traMADol (ULTRAM) 50 MG tablet Take 1 tablet (50 mg total) by mouth every 4 (four) hours as needed. 180 tablet 0   vitamin B-12 (CYANOCOBALAMIN) 1000 MCG tablet Take 1 tablet (1,000 mcg total) by mouth daily. 30 tablet 3   Vitamin E 400 units TABS Take 400 Units by mouth daily.      No current facility-administered medications for this visit.   Family History  Problem Relation Age of Onset   Diabetes Mother    Heart disease Mother         valve leak   Anxiety disorder Mother    Depression Mother    High blood pressure Mother    Kidney failure Mother    Cancer Father  prostate   Heart disease Father    Learning disabilities Sister    Depression Sister    Depression Sister    Anxiety disorder Sister    Colon cancer Neg Hx    Social History   Socioeconomic History   Marital status: Single    Spouse name: Not on file   Number of children: 1   Years of education: 11   Highest education level: Not on file  Occupational History   Occupation: Disabled  Tobacco Use   Smoking status: Former    Packs/day: 0.10    Years: 25.00    Additional pack years: 0.00    Total pack years: 2.50    Types: Cigarettes    Quit date: 05/24/2019    Years since quitting: 3.2   Smokeless tobacco: Never   Tobacco comments:    quit in April.  Vaping Use   Vaping Use: Never used  Substance and Sexual Activity   Alcohol use: Not Currently    Alcohol/week: 0.0 standard drinks of alcohol    Comment: none sine 05/2018   Drug use: Not Currently    Frequency: 3.0 times per week    Types: Marijuana   Sexual activity: Yes    Birth control/protection: Implant    Comment: placed in March 2024  Other Topics Concern   Not on file  Social History Narrative   Lives at home w/ her mother and daughter   Right-handed   Caffeine: about 3 Cokes per week   Social Determinants of Health   Financial Resource Strain: High Risk (03/18/2022)   Overall Financial Resource Strain (CARDIA)    Difficulty of Paying Living Expenses: Hard  Food Insecurity: Patient Declined (06/10/2022)   Hunger Vital Sign    Worried About Running Out of Food in the Last Year: Patient declined    Ran Out of Food in the Last Year: Patient declined  Recent Concern: Food Insecurity - Food Insecurity Present (03/18/2022)   Hunger Vital Sign    Worried About Running Out of Food in the Last Year: Often true    Ran Out of Food in the Last Year: Sometimes true   Transportation Needs: Patient Declined (06/10/2022)   PRAPARE - Administrator, Civil Service (Medical): Patient declined    Lack of Transportation (Non-Medical): Patient declined  Recent Concern: Transportation Needs - Unmet Transportation Needs (03/18/2022)   PRAPARE - Transportation    Lack of Transportation (Medical): Yes    Lack of Transportation (Non-Medical): Yes  Physical Activity: Insufficiently Active (08/14/2021)   Exercise Vital Sign    Days of Exercise per Week: 1 day    Minutes of Exercise per Session: 40 min  Stress: No Stress Concern Present (08/14/2021)   [employer] of Occupational Health - Occupational Stress Questionnaire    Feeling of Stress : Only a little  Social Connections: Moderately Isolated (03/18/2022)   Social Connection and Isolation Panel [NHANES]    Frequency of Communication with Friends and Family: More than three times a week    Frequency of Social Gatherings with Friends and Family: Never    Attends Religious Services: 1 to 4 times per year    Active Member of Clubs or Organizations: No    Attends Banker Meetings: Never    Marital Status: Never married     Objective:  Physical Exam:  Vitals:   08/07/22 0928  BP: 129/85  Pulse: (!) 104  Temp: 99 F (37.2 C)  TempSrc: Oral  SpO2: 100%  Weight: (!) 322 lb 11.2 oz (146.4 kg)  Height: 5\' 10"  (1.778 m)    Constitutional: Obese, NAD Cardiovascular: Tachycardic but regular Pulmonary/Chest: Clear bilaterally  Abdominal: Very tender to palpation in the epigastric and RUQ with some guarding. Not distended, diminished bowel sounds.  Skin: Large patch of alopecia over the vertex and midscalp region    Assessment & Plan:   Gastroenteritis Patient here with acute onset abdominal pain, nausea, tachycardia, and reported fever of 101 yesterday. Denies emesis but has been taking zofran for her symptoms. No diarrhea but is passing mucous in her stool about once per day.  I suspect her symptoms are acute viral gastroenteritis vs. Symptoms of opioid withdrawal given that she took her last tramadol pill on Friday around the time that symptoms started. Given her exam and RUQ / epigastric tenderness I am checking labs to rule out pancreatitis and cholecystitis. Advised supportive care in the meantime. Encouraged PO hydration.   Chronic, continuous use of opioids Unfortunately it is clinic policy that we do not replace stolen or lost opioid prescriptions. Reminded patient of this which she acknowledged when she signed her pain contract with me. I am not able to refill her tramadol early. I do worry some of her symptoms could be related to opioid withdrawal. But COWS score is consistent with mild withdrawal and she should be out of the window for this soon. Sent new tramadol Rx to her pharmacy which can be filled 30 days after her last rx.   Alopecia Large area of alopecia over the midscalp / vertex region. Suspect female pattern hair loss. Referred to dermatology.

## 2022-08-07 NOTE — Assessment & Plan Note (Signed)
Patient here with acute onset abdominal pain, nausea, tachycardia, and reported fever of 101 yesterday. Denies emesis but has been taking zofran for her symptoms. No diarrhea but is passing mucous in her stool about once per day. I suspect her symptoms are acute viral gastroenteritis vs. Symptoms of opioid withdrawal given that she took her last tramadol pill on Friday around the time that symptoms started. Given her exam and RUQ / epigastric tenderness I am checking labs to rule out pancreatitis and cholecystitis. Advised supportive care in the meantime. Encouraged PO hydration.

## 2022-08-07 NOTE — Assessment & Plan Note (Signed)
Large area of alopecia over the midscalp / vertex region. Suspect female pattern hair loss. Referred to dermatology.

## 2022-08-07 NOTE — Assessment & Plan Note (Signed)
Unfortunately it is clinic policy that we do not replace stolen or lost opioid prescriptions. Reminded patient of this which she acknowledged when she signed her pain contract with me. I am not able to refill her tramadol early. I do worry some of her symptoms could be related to opioid withdrawal. But COWS score is consistent with mild withdrawal and she should be out of the window for this soon. Sent new tramadol Rx to her pharmacy which can be filled 30 days after her last rx.

## 2022-08-08 MED ORDER — MELOXICAM 7.5 MG PO TABS: ORAL_TABLET | ORAL | 1 refills | Status: DC

## 2022-08-08 NOTE — Addendum Note (Signed)
Addended by: Burnell Blanks on: 08/08/2022 09:10 AM   Modules accepted: Orders

## 2022-08-09 ENCOUNTER — Telehealth: Payer: Self-pay

## 2022-08-09 ENCOUNTER — Other Ambulatory Visit: Payer: Self-pay | Admitting: Student

## 2022-08-09 ENCOUNTER — Other Ambulatory Visit: Payer: Self-pay

## 2022-08-09 ENCOUNTER — Observation Stay (HOSPITAL_COMMUNITY)
Admission: EM | Admit: 2022-08-09 | Discharge: 2022-08-11 | Disposition: A | Discharge status: Home / Self Care | Payer: 59 | Attending: Internal Medicine | Admitting: Internal Medicine

## 2022-08-09 ENCOUNTER — Encounter (HOSPITAL_COMMUNITY): Payer: Self-pay | Admitting: *Deleted

## 2022-08-09 DIAGNOSIS — D649 Anemia, unspecified: Secondary | ICD-10-CM | POA: Diagnosis not present

## 2022-08-09 DIAGNOSIS — N189 Chronic kidney disease, unspecified: Secondary | ICD-10-CM | POA: Diagnosis not present

## 2022-08-09 DIAGNOSIS — R1084 Generalized abdominal pain: Secondary | ICD-10-CM | POA: Diagnosis not present

## 2022-08-09 DIAGNOSIS — N179 Acute kidney failure, unspecified: Secondary | ICD-10-CM | POA: Insufficient documentation

## 2022-08-09 DIAGNOSIS — E876 Hypokalemia: Secondary | ICD-10-CM | POA: Insufficient documentation

## 2022-08-09 DIAGNOSIS — Z87891 Personal history of nicotine dependence: Secondary | ICD-10-CM | POA: Diagnosis not present

## 2022-08-09 DIAGNOSIS — M797 Fibromyalgia: Secondary | ICD-10-CM | POA: Diagnosis not present

## 2022-08-09 DIAGNOSIS — I130 Hypertensive heart and chronic kidney disease with heart failure and stage 1 through stage 4 chronic kidney disease, or unspecified chronic kidney disease: Secondary | ICD-10-CM | POA: Insufficient documentation

## 2022-08-09 DIAGNOSIS — Z794 Long term (current) use of insulin: Secondary | ICD-10-CM | POA: Insufficient documentation

## 2022-08-09 DIAGNOSIS — I5040 Unspecified combined systolic (congestive) and diastolic (congestive) heart failure: Secondary | ICD-10-CM | POA: Diagnosis not present

## 2022-08-09 DIAGNOSIS — I10 Essential (primary) hypertension: Secondary | ICD-10-CM | POA: Diagnosis present

## 2022-08-09 DIAGNOSIS — R7989 Other specified abnormal findings of blood chemistry: Secondary | ICD-10-CM

## 2022-08-09 DIAGNOSIS — E1122 Type 2 diabetes mellitus with diabetic chronic kidney disease: Secondary | ICD-10-CM | POA: Diagnosis not present

## 2022-08-09 DIAGNOSIS — K589 Irritable bowel syndrome without diarrhea: Secondary | ICD-10-CM | POA: Diagnosis present

## 2022-08-09 DIAGNOSIS — Z7985 Long-term (current) use of injectable non-insulin antidiabetic drugs: Secondary | ICD-10-CM | POA: Insufficient documentation

## 2022-08-09 DIAGNOSIS — R944 Abnormal results of kidney function studies: Secondary | ICD-10-CM | POA: Insufficient documentation

## 2022-08-09 DIAGNOSIS — Z79899 Other long term (current) drug therapy: Secondary | ICD-10-CM | POA: Insufficient documentation

## 2022-08-09 DIAGNOSIS — R1013 Epigastric pain: Secondary | ICD-10-CM | POA: Diagnosis present

## 2022-08-09 DIAGNOSIS — R109 Unspecified abdominal pain: Secondary | ICD-10-CM | POA: Diagnosis present

## 2022-08-09 DIAGNOSIS — J449 Chronic obstructive pulmonary disease, unspecified: Secondary | ICD-10-CM | POA: Insufficient documentation

## 2022-08-09 DIAGNOSIS — E860 Dehydration: Secondary | ICD-10-CM | POA: Insufficient documentation

## 2022-08-09 DIAGNOSIS — K5900 Constipation, unspecified: Secondary | ICD-10-CM | POA: Insufficient documentation

## 2022-08-09 DIAGNOSIS — N921 Excessive and frequent menstruation with irregular cycle: Secondary | ICD-10-CM | POA: Diagnosis not present

## 2022-08-09 LAB — HCG, SERUM, QUALITATIVE: Preg, Serum: NEGATIVE

## 2022-08-09 LAB — CBC WITH DIFFERENTIAL/PLATELET
Basophils Absolute: 0 10*3/uL (ref 0.0–0.2)
Basos: 0 %
EOS (ABSOLUTE): 0.2 10*3/uL (ref 0.0–0.4)
Eos: 3 %
Hematocrit: 34.5 % (ref 34.0–46.6)
Hemoglobin: 11.4 g/dL (ref 11.1–15.9)
Immature Grans (Abs): 0 10*3/uL (ref 0.0–0.1)
Immature Granulocytes: 0 %
Lymphocytes Absolute: 2 10*3/uL (ref 0.7–3.1)
Lymphs: 30 %
MCH: 27.5 pg (ref 26.6–33.0)
MCHC: 33 g/dL (ref 31.5–35.7)
MCV: 83 fL (ref 79–97)
Monocytes Absolute: 0.9 10*3/uL (ref 0.1–0.9)
Monocytes: 14 %
Neutrophils Absolute: 3.6 10*3/uL (ref 1.4–7.0)
Neutrophils: 53 %
Platelets: 421 10*3/uL (ref 150–450)
RBC: 4.14 x10E6/uL (ref 3.77–5.28)
RDW: 14.3 % (ref 11.7–15.4)
WBC: 6.7 10*3/uL (ref 3.4–10.8)

## 2022-08-09 LAB — COMPREHENSIVE METABOLIC PANEL
ALT: 10 U/L (ref 0–44)
AST: 9 U/L — ABNORMAL LOW (ref 15–41)
Albumin: 3.3 g/dL — ABNORMAL LOW (ref 3.5–5.0)
Alkaline Phosphatase: 58 U/L (ref 38–126)
Anion gap: 8 (ref 5–15)
BUN: 9 mg/dL (ref 6–20)
CO2: 24 mmol/L (ref 22–32)
Calcium: 8.7 mg/dL — ABNORMAL LOW (ref 8.9–10.3)
Chloride: 106 mmol/L (ref 98–111)
Creatinine, Ser: 1.19 mg/dL — ABNORMAL HIGH (ref 0.44–1.00)
GFR, Estimated: 55 mL/min — ABNORMAL LOW (ref >60)
Glucose, Bld: 99 mg/dL (ref 70–99)
Potassium: 3.4 mmol/L — ABNORMAL LOW (ref 3.5–5.1)
Sodium: 138 mmol/L (ref 135–145)
Total Bilirubin: 0.3 mg/dL (ref 0.3–1.2)
Total Protein: 6.4 g/dL — ABNORMAL LOW (ref 6.5–8.1)

## 2022-08-09 LAB — CMP14 + ANION GAP
ALT: 8 IU/L (ref 0–32)
AST: 9 IU/L (ref 0–40)
Albumin: 4.3 g/dL (ref 3.8–4.9)
Alkaline Phosphatase: 77 IU/L (ref 44–121)
Anion Gap: 16 mmol/L (ref 10.0–18.0)
BUN/Creatinine Ratio: 9 (ref 9–23)
BUN: 12 mg/dL (ref 6–24)
Bilirubin Total: <0.2 mg/dL (ref 0.0–1.2)
CO2: 21 mmol/L (ref 20–29)
Calcium: 9.5 mg/dL (ref 8.7–10.2)
Chloride: 105 mmol/L (ref 96–106)
Creatinine, Ser: 1.27 mg/dL — ABNORMAL HIGH (ref 0.57–1.00)
Globulin, Total: 2.4 g/dL (ref 1.5–4.5)
Glucose: 93 mg/dL (ref 70–99)
Potassium: 4 mmol/L (ref 3.5–5.2)
Sodium: 142 mmol/L (ref 134–144)
Total Protein: 6.7 g/dL (ref 6.0–8.5)
eGFR: 51 mL/min/{1.73_m2} — ABNORMAL LOW (ref >59)

## 2022-08-09 LAB — CBC
HCT: 32.6 % — ABNORMAL LOW (ref 36.0–46.0)
Hemoglobin: 10.1 g/dL — ABNORMAL LOW (ref 12.0–15.0)
MCH: 26.3 pg (ref 26.0–34.0)
MCHC: 31 g/dL (ref 30.0–36.0)
MCV: 84.9 fL (ref 80.0–100.0)
Platelets: 375 10*3/uL (ref 150–400)
RBC: 3.84 MIL/uL — ABNORMAL LOW (ref 3.87–5.11)
RDW: 15.9 % — ABNORMAL HIGH (ref 11.5–15.5)
WBC: 7.7 10*3/uL (ref 4.0–10.5)
nRBC: 0 % (ref 0.0–0.2)

## 2022-08-09 LAB — LIPASE, BLOOD: Lipase: 24 U/L (ref 11–51)

## 2022-08-09 LAB — LIPASE: Lipase: 12 U/L — ABNORMAL LOW (ref 14–72)

## 2022-08-09 NOTE — Progress Notes (Signed)
Received after hours page to return phone call to this patient.   Lindsay [name] was recently seen at [facility] clinic for abdominal pain and chronic opioid use for pain. At the visit, she was presumed to have gastroenteritis vs pancreatitis vs cholecystitis and blood work was done to evaluate the above. She called the office today as she reports worsening of abdominal pain and inability to keep fluids or food down. Patient requested her recent lab work results.  Patient reports that since she was seen in the clinic, she has continued to have mucus-filled loose stools, severe, 10/10 epigastric pain not relieved by pepto bismuth our bentyl, severe nausea and vomiting not relived by sustained Zofran therapy, and limited PO intake (only limited volume of ginger ale in the past day or so). Patient has noted some blood streaks in her stool as well. She feels tired, lightheaded, and weak. Patient reports that she has gotten progressively worse since 08/01/2022.  She denies alcohol use, sick contacts, inability to lay flat, or tense abdomen.  Denies shortness of breath or chest pain.  Patient also inquired about her Tramadol prescription, which she lost in [place] while visiting a family member. She was frustrated that she won't be able to refill prescription until 7/26, which is 30 days after her las refill. Patient reports frustration with not being able to do this.  O: 7/10 labs Cr. 1.27 (baseline 0.8) GFR 51(baseline 82) Lipase decreased at 12 CBC wnl  A: Discussed with patient the likelihood of infectious gastroenteritis with dehydration likely leading to worsening of renal function, especially with GI losses and poor po intake. Given worsening in her abdominal pain, still concerned about pancreatitis (although perhaps chronic  with low lipase) vs cholelithiasis vs diverticulitis (blood in stool). Advised patient to be medically evaluated in the ED vs directly admitted to the hospital for fluid resuscitation and  further evaluation. However, patient declined as she does not have transportation today. Offered patient to send message to clinic staff to to return call on Monday morning, and if no improvement, be evaluated in clinic. Patient was adamant about not being able to reach hospital at this time. Reviewed precautionary symptoms that would warrant ED visit for full work up as telephone encounters are limited.  Patient was also discussed with PCP Dr. [name] - refill of Tramadol is not appropriate at this time as it is too early given recent refill. Patient was reminded of her pain contract with [facility] clinic.  Patient expressed understanding of what severity of her symptoms could mean and that she know how to reach clinic or the after hours number should she worsen clinically.   Morene Crocker, MD

## 2022-08-09 NOTE — ED Triage Notes (Signed)
The pt is c/o abd pain since last Friday  nausea and vomiting

## 2022-08-09 NOTE — Telephone Encounter (Signed)
Pt states she still has abdominal pain and requesting her lab results. Please call pt back.

## 2022-08-10 ENCOUNTER — Observation Stay (HOSPITAL_COMMUNITY): Payer: 59

## 2022-08-10 ENCOUNTER — Telehealth: Payer: Self-pay | Admitting: Gastroenterology

## 2022-08-10 ENCOUNTER — Emergency Department (HOSPITAL_COMMUNITY): Payer: 59

## 2022-08-10 DIAGNOSIS — Z79899 Other long term (current) drug therapy: Secondary | ICD-10-CM | POA: Diagnosis not present

## 2022-08-10 DIAGNOSIS — Z7985 Long-term (current) use of injectable non-insulin antidiabetic drugs: Secondary | ICD-10-CM

## 2022-08-10 DIAGNOSIS — E876 Hypokalemia: Secondary | ICD-10-CM | POA: Diagnosis not present

## 2022-08-10 DIAGNOSIS — I5020 Unspecified systolic (congestive) heart failure: Secondary | ICD-10-CM

## 2022-08-10 DIAGNOSIS — R7989 Other specified abnormal findings of blood chemistry: Secondary | ICD-10-CM | POA: Diagnosis not present

## 2022-08-10 DIAGNOSIS — M797 Fibromyalgia: Secondary | ICD-10-CM

## 2022-08-10 DIAGNOSIS — E119 Type 2 diabetes mellitus without complications: Secondary | ICD-10-CM | POA: Diagnosis not present

## 2022-08-10 DIAGNOSIS — R1013 Epigastric pain: Secondary | ICD-10-CM | POA: Diagnosis not present

## 2022-08-10 DIAGNOSIS — R109 Unspecified abdominal pain: Secondary | ICD-10-CM | POA: Diagnosis present

## 2022-08-10 DIAGNOSIS — R1084 Generalized abdominal pain: Secondary | ICD-10-CM

## 2022-08-10 DIAGNOSIS — I110 Hypertensive heart disease with heart failure: Secondary | ICD-10-CM

## 2022-08-10 DIAGNOSIS — F331 Major depressive disorder, recurrent, moderate: Secondary | ICD-10-CM

## 2022-08-10 DIAGNOSIS — Z794 Long term (current) use of insulin: Secondary | ICD-10-CM

## 2022-08-10 LAB — DIFFERENTIAL
Abs Immature Granulocytes: 0.04 10*3/uL (ref 0.00–0.07)
Basophils Absolute: 0 10*3/uL (ref 0.0–0.1)
Basophils Relative: 1 %
Eosinophils Absolute: 0.2 10*3/uL (ref 0.0–0.5)
Eosinophils Relative: 2 %
Immature Granulocytes: 1 %
Lymphocytes Relative: 31 %
Lymphs Abs: 2.7 10*3/uL (ref 0.7–4.0)
Monocytes Absolute: 0.9 10*3/uL (ref 0.1–1.0)
Monocytes Relative: 11 %
Neutro Abs: 4.9 10*3/uL (ref 1.7–7.7)
Neutrophils Relative %: 54 %

## 2022-08-10 LAB — URINALYSIS, ROUTINE W REFLEX MICROSCOPIC
Glucose, UA: NEGATIVE mg/dL
Hgb urine dipstick: NEGATIVE
Ketones, ur: NEGATIVE mg/dL
Leukocytes,Ua: NEGATIVE
Nitrite: NEGATIVE
Protein, ur: 100 mg/dL — AB
Specific Gravity, Urine: >1.046 — ABNORMAL HIGH (ref 1.005–1.030)
pH: 5 (ref 5.0–8.0)

## 2022-08-10 LAB — CBC
HCT: 35.8 % — ABNORMAL LOW (ref 36.0–46.0)
Hemoglobin: 10.9 g/dL — ABNORMAL LOW (ref 12.0–15.0)
MCH: 26 pg (ref 26.0–34.0)
MCHC: 30.4 g/dL (ref 30.0–36.0)
MCV: 85.2 fL (ref 80.0–100.0)
Platelets: 367 10*3/uL (ref 150–400)
RBC: 4.2 MIL/uL (ref 3.87–5.11)
RDW: 15.9 % — ABNORMAL HIGH (ref 11.5–15.5)
WBC: 8.8 10*3/uL (ref 4.0–10.5)
nRBC: 0 % (ref 0.0–0.2)

## 2022-08-10 LAB — GLUCOSE, CAPILLARY
Glucose-Capillary: 60 mg/dL — ABNORMAL LOW (ref 70–99)
Glucose-Capillary: 80 mg/dL (ref 70–99)
Glucose-Capillary: 80 mg/dL (ref 70–99)
Glucose-Capillary: 93 mg/dL (ref 70–99)
Glucose-Capillary: 84 mg/dL (ref 70–99)

## 2022-08-10 LAB — C-REACTIVE PROTEIN: CRP: 0.9 mg/dL (ref <1.0)

## 2022-08-10 MED ORDER — METOPROLOL SUCCINATE ER 50 MG PO TB24
50.0000 mg | ORAL_TABLET | Freq: Every day | ORAL | Status: DC
  Administered 2022-08-10 – 2022-08-11 (×2): 50 mg via ORAL
  Filled 2022-08-10 (×2): qty 1

## 2022-08-10 MED ORDER — ENOXAPARIN SODIUM 80 MG/0.8ML IJ SOSY
70.0000 mg | PREFILLED_SYRINGE | INTRAMUSCULAR | Status: DC
  Filled 2022-08-10: qty 0.7

## 2022-08-10 MED ORDER — ALBUTEROL SULFATE (2.5 MG/3ML) 0.083% IN NEBU: 2.5000 mg | INHALATION_SOLUTION | RESPIRATORY_TRACT | Status: DC | PRN

## 2022-08-10 MED ORDER — FLUTICASONE FUROATE-VILANTEROL 200-25 MCG/ACT IN AEPB
1.0000 | INHALATION_SPRAY | Freq: Every day | RESPIRATORY_TRACT | Status: DC
  Administered 2022-08-10 – 2022-08-11 (×2): 1 via RESPIRATORY_TRACT
  Filled 2022-08-10: qty 28

## 2022-08-10 MED ORDER — TORSEMIDE 20 MG PO TABS: 40.0000 mg | ORAL_TABLET | Freq: Two times a day (BID) | ORAL | Status: DC

## 2022-08-10 MED ORDER — SACUBITRIL-VALSARTAN 49-51 MG PO TABS
1.0000 | ORAL_TABLET | Freq: Two times a day (BID) | ORAL | Status: DC
  Administered 2022-08-10 – 2022-08-11 (×3): 1 via ORAL
  Filled 2022-08-10 (×5): qty 1

## 2022-08-10 MED ORDER — IOHEXOL 350 MG/ML SOLN
100.0000 mL | Freq: Once | INTRAVENOUS | Status: AC | PRN
  Administered 2022-08-10: 100 mL via INTRAVENOUS

## 2022-08-10 MED ORDER — TRAMADOL HCL 50 MG PO TABS
50.0000 mg | ORAL_TABLET | ORAL | Status: DC | PRN
  Administered 2022-08-10 – 2022-08-11 (×3): 50 mg via ORAL
  Filled 2022-08-10 (×4): qty 1

## 2022-08-10 MED ORDER — LORATADINE 10 MG PO TABS
10.0000 mg | ORAL_TABLET | Freq: Every day | ORAL | Status: DC
  Administered 2022-08-10 – 2022-08-11 (×2): 10 mg via ORAL
  Filled 2022-08-10 (×2): qty 1

## 2022-08-10 MED ORDER — DEXTROSE IN LACTATED RINGERS 5 % IV SOLN: INTRAVENOUS | Status: AC

## 2022-08-10 MED ORDER — IOHEXOL 350 MG/ML SOLN
75.0000 mL | Freq: Once | INTRAVENOUS | Status: AC | PRN
  Administered 2022-08-10: 75 mL via INTRAVENOUS

## 2022-08-10 MED ORDER — DICYCLOMINE HCL 10 MG PO CAPS
10.0000 mg | ORAL_CAPSULE | Freq: Three times a day (TID) | ORAL | Status: DC | PRN
  Administered 2022-08-10: 10 mg via ORAL
  Filled 2022-08-10 (×2): qty 1

## 2022-08-10 MED ORDER — HYDROMORPHONE HCL 1 MG/ML IJ SOLN
0.5000 mg | Freq: Once | INTRAMUSCULAR | Status: AC
  Administered 2022-08-10: 0.5 mg via INTRAVENOUS
  Filled 2022-08-10: qty 1

## 2022-08-10 MED ORDER — FAMOTIDINE 20 MG PO TABS
20.0000 mg | ORAL_TABLET | Freq: Every day | ORAL | Status: DC
  Administered 2022-08-10 – 2022-08-11 (×2): 20 mg via ORAL
  Filled 2022-08-10 (×2): qty 1

## 2022-08-10 MED ORDER — ONDANSETRON HCL 4 MG/2ML IJ SOLN
4.0000 mg | Freq: Once | INTRAMUSCULAR | Status: AC
  Administered 2022-08-10: 4 mg via INTRAVENOUS
  Filled 2022-08-10: qty 2

## 2022-08-10 MED ORDER — PROCHLORPERAZINE EDISYLATE 10 MG/2ML IJ SOLN
10.0000 mg | Freq: Four times a day (QID) | INTRAMUSCULAR | Status: DC | PRN
  Administered 2022-08-11: 10 mg via INTRAVENOUS
  Filled 2022-08-10: qty 2

## 2022-08-10 MED ORDER — FENTANYL CITRATE PF 50 MCG/ML IJ SOSY
50.0000 ug | PREFILLED_SYRINGE | Freq: Once | INTRAMUSCULAR | Status: AC
  Administered 2022-08-10: 50 ug via INTRAVENOUS
  Filled 2022-08-10: qty 1

## 2022-08-10 MED ORDER — SODIUM CHLORIDE 0.9 % IV BOLUS
500.0000 mL | Freq: Once | INTRAVENOUS | Status: AC
  Administered 2022-08-10: 500 mL via INTRAVENOUS

## 2022-08-10 MED ORDER — LACTATED RINGERS IV SOLN: INTRAVENOUS | Status: DC

## 2022-08-10 MED ORDER — POTASSIUM CHLORIDE 10 MEQ/100ML IV SOLN
10.0000 meq | INTRAVENOUS | Status: AC
  Administered 2022-08-10 (×3): 10 meq via INTRAVENOUS
  Filled 2022-08-10 (×3): qty 100

## 2022-08-10 MED ORDER — TIZANIDINE HCL 4 MG PO TABS
4.0000 mg | ORAL_TABLET | Freq: Three times a day (TID) | ORAL | Status: DC | PRN
  Administered 2022-08-10 – 2022-08-11 (×3): 4 mg via ORAL
  Filled 2022-08-10 (×4): qty 1

## 2022-08-10 MED ORDER — INSULIN ASPART 100 UNIT/ML IJ SOLN: 0.0000 [IU] | Freq: Three times a day (TID) | INTRAMUSCULAR | Status: DC

## 2022-08-10 MED ORDER — DOXEPIN HCL 25 MG PO CAPS
25.0000 mg | ORAL_CAPSULE | Freq: Every day | ORAL | Status: DC
  Administered 2022-08-10: 25 mg via ORAL
  Filled 2022-08-10: qty 1

## 2022-08-10 MED ORDER — TRAMADOL HCL 50 MG PO TABS: 50.0000 mg | ORAL_TABLET | Freq: Once | ORAL | Status: DC

## 2022-08-10 MED ORDER — BREXPIPRAZOLE 2 MG PO TABS
3.0000 mg | ORAL_TABLET | Freq: Every day | ORAL | Status: DC
  Administered 2022-08-10 – 2022-08-11 (×2): 3 mg via ORAL
  Filled 2022-08-10 (×2): qty 1

## 2022-08-10 MED ORDER — POLYETHYLENE GLYCOL 3350 17 G PO PACK
17.0000 g | PACK | Freq: Every day | ORAL | Status: DC
  Administered 2022-08-10 – 2022-08-11 (×2): 17 g via ORAL
  Filled 2022-08-10 (×2): qty 1

## 2022-08-10 MED ORDER — ALBUTEROL SULFATE HFA 108 (90 BASE) MCG/ACT IN AERS: 2.0000 | INHALATION_SPRAY | RESPIRATORY_TRACT | Status: DC | PRN

## 2022-08-10 MED ORDER — LAMOTRIGINE 150 MG PO TABS
150.0000 mg | ORAL_TABLET | Freq: Every day | ORAL | Status: DC
  Administered 2022-08-10 – 2022-08-11 (×2): 150 mg via ORAL
  Filled 2022-08-10 (×2): qty 1

## 2022-08-10 MED ORDER — TORSEMIDE 20 MG PO TABS
20.0000 mg | ORAL_TABLET | Freq: Two times a day (BID) | ORAL | Status: DC
  Filled 2022-08-10: qty 1

## 2022-08-10 MED ORDER — MELATONIN 3 MG PO TABS
3.0000 mg | ORAL_TABLET | Freq: Every day | ORAL | Status: DC
  Administered 2022-08-10: 3 mg via ORAL
  Filled 2022-08-10: qty 1

## 2022-08-10 MED ORDER — TORSEMIDE 20 MG PO TABS
20.0000 mg | ORAL_TABLET | Freq: Two times a day (BID) | ORAL | Status: DC
  Administered 2022-08-10: 20 mg via ORAL
  Filled 2022-08-10 (×2): qty 1

## 2022-08-10 MED ORDER — LIDOCAINE 5 % EX PTCH
1.0000 | MEDICATED_PATCH | CUTANEOUS | Status: DC
  Filled 2022-08-10: qty 1

## 2022-08-10 MED ORDER — ONDANSETRON HCL 4 MG/2ML IJ SOLN
4.0000 mg | Freq: Four times a day (QID) | INTRAMUSCULAR | Status: DC | PRN
  Administered 2022-08-10: 4 mg via INTRAVENOUS
  Filled 2022-08-10: qty 2

## 2022-08-10 NOTE — ED Notes (Signed)
ED TO INPATIENT HANDOFF REPORT  ED Nurse Name and Phone #: Sitlaly Gudiel [phone]  S Name/Age/Gender Lindsay [name] 52 y.o. female Room/Bed: H012C/H012C  Code Status   Code Status: Full Code  Home/SNF/Other Home Patient oriented to: self, place, time, and situation Is this baseline? Yes   Triage Complete: Triage complete  Chief Complaint Abdominal pain [R10.9]  Triage Note The pt is c/o abd pain since last Friday  nausea and vomiting   Allergies Allergies  Allergen Reactions   Lisinopril Rash and Cough    Level of Care/Admitting Diagnosis ED Disposition     ED Disposition  Admit   Condition  --   Comment  Hospital Area: [facility] [[id number]]  Level of Care: Med-Surg [16]  May place patient in observation at [facility] or [facility] if equivalent level of care is available:: Yes  Covid Evaluation: Asymptomatic - no recent exposure (last 10 days) testing not required  Diagnosis: Abdominal pain [744753]  Admitting Physician: Dickie La [[id number]]  Attending Physician: Dickie La [[id number]]          B Medical/Surgery History Past Medical History:  Diagnosis Date   Acid reflux    Anemia    Iron Def   Anorexia    CHF (congestive heart failure) (HCC)    Chronic kidney disease    Nephrotic syndrome   Colon polyp 2009   Depression with anxiety    Edema leg    Fibroid tumor 04/2022   Fibromyalgia    Hemorrhoids    Hidradenitis suppurativa    Hypertension    IBS (irritable bowel syndrome)    Low back pain    Migraines    Morbidly obese (HCC)    Neuromuscular disorder (HCC)    fibromyalgia   Neuropathy    Panic attacks    Polyp of vocal cord or larynx    Stroke (HCC)    Tonsil pain    Past Surgical History:  Procedure Laterality Date   AXILLARY HIDRADENITIS EXCISION     COLONOSCOPY WITH PROPOFOL N/A 05/25/2015   Procedure: COLONOSCOPY WITH PROPOFOL;  Surgeon: Rachael Fee, MD;  Location: [facility];  Service: Endoscopy;  Laterality:  N/A;   COLONOSCOPY WITH PROPOFOL N/A 12/31/2018   Procedure: COLONOSCOPY WITH PROPOFOL;  Surgeon: Rachael Fee, MD;  Location: [facility];  Service: Endoscopy;  Laterality: N/A;   HEMORRHOID SURGERY     with Hidradenitis surgery    INGUINAL HIDRADENITIS EXCISION     TONSILLECTOMY  10/18/2010   by Dr. Annalee Genta   UPPER GASTROINTESTINAL ENDOSCOPY       A IV Location/Drains/Wounds Patient Lines/Drains/Airways Status     Active Line/Drains/Airways     Name Placement date Placement time Site Days   Peripheral IV 08/10/22 20 G Left Antecubital 08/10/22  0224  Antecubital  less than 1            Intake/Output Last 24 hours No intake or output data in the 24 hours ending 08/10/22 0558  Labs/Imaging Results for orders placed or performed during the hospital encounter of 08/09/22 (from the past 48 hour(s))  Lipase, blood     Status: None   Collection Time: 08/09/22  8:24 PM  Result Value Ref Range   Lipase 24 11 - 51 U/L    Comment: Performed at [facility], [address], [place], [place] [address]  Comprehensive metabolic panel     Status: Abnormal   Collection Time: 08/09/22  8:24 PM  Result Value  Ref Range   Sodium 138 135 - 145 mmol/L   Potassium 3.4 (L) 3.5 - 5.1 mmol/L   Chloride 106 98 - 111 mmol/L   CO2 24 22 - 32 mmol/L   Glucose, Bld 99 70 - 99 mg/dL    Comment: Glucose reference range applies only to samples taken after fasting for at least 8 hours.   BUN 9 6 - 20 mg/dL   Creatinine, Ser 1.61 (H) 0.44 - 1.00 mg/dL   Calcium 8.7 (L) 8.9 - 10.3 mg/dL   Total Protein 6.4 (L) 6.5 - 8.1 g/dL   Albumin 3.3 (L) 3.5 - 5.0 g/dL   AST 9 (L) 15 - 41 U/L   ALT 10 0 - 44 U/L   Alkaline Phosphatase 58 38 - 126 U/L   Total Bilirubin 0.3 0.3 - 1.2 mg/dL   GFR, Estimated 55 (L) >60 mL/min    Comment: (NOTE) Calculated using the CKD-EPI Creatinine Equation (2021)    Anion gap 8 5 - 15    Comment: Performed at [facility], [address],  [place], [place] [address]  CBC     Status: Abnormal   Collection Time: 08/09/22  8:24 PM  Result Value Ref Range   WBC 7.7 4.0 - 10.5 K/uL   RBC 3.84 (L) 3.87 - 5.11 MIL/uL   Hemoglobin 10.1 (L) 12.0 - 15.0 g/dL   HCT 54.0 (L) 98.1 - 19.1 %   MCV 84.9 80.0 - 100.0 fL   MCH 26.3 26.0 - 34.0 pg   MCHC 31.0 30.0 - 36.0 g/dL   RDW 47.8 (H) 29.5 - 62.1 %   Platelets 375 150 - 400 K/uL   nRBC 0.0 0.0 - 0.2 %    Comment: Performed at [facility], [address], [place], [place] [address]  hCG, serum, qualitative     Status: None   Collection Time: 08/09/22  8:24 PM  Result Value Ref Range   Preg, Serum NEGATIVE NEGATIVE    Comment: Performed at [facility], [address], [place], [place] [address]  Urinalysis, Routine w reflex microscopic -Urine, Clean Catch     Status: Abnormal   Collection Time: 08/10/22  4:21 AM  Result Value Ref Range   Color, Urine AMBER (A) YELLOW    Comment: BIOCHEMICALS MAY BE AFFECTED BY COLOR   APPearance CLOUDY (A) CLEAR   Specific Gravity, Urine >1.046 (H) 1.005 - 1.030   pH 5.0 5.0 - 8.0   Glucose, UA NEGATIVE NEGATIVE mg/dL   Hgb urine dipstick NEGATIVE NEGATIVE   Bilirubin Urine SMALL (A) NEGATIVE   Ketones, ur NEGATIVE NEGATIVE mg/dL   Protein, ur 629 (A) NEGATIVE mg/dL   Nitrite NEGATIVE NEGATIVE   Leukocytes,Ua NEGATIVE NEGATIVE   RBC / HPF 0-5 0 - 5 RBC/hpf   WBC, UA 0-5 0 - 5 WBC/hpf   Bacteria, UA RARE (A) NONE SEEN   Squamous Epithelial / HPF 0-5 0 - 5 /HPF   Mucus PRESENT    Non Squamous Epithelial 0-5 (A) NONE SEEN    Comment: Performed at [facility], [address], [place], [place] [address]   *Note: Due to a large number of results and/or encounters for the requested time period, some results have not been displayed. A complete set of results can be found in Results Review.   CT ABDOMEN PELVIS W CONTRAST  Result Date: 08/10/2022 CLINICAL DATA:  52 year old female with 24 hours of abdominal pain. EXAM: CT  ABDOMEN  AND PELVIS WITH CONTRAST TECHNIQUE: Multidetector CT imaging of the abdomen and pelvis was performed using the standard protocol following bolus administration of intravenous contrast. RADIATION DOSE REDUCTION: This exam was performed according to the departmental dose-optimization program which includes automated exposure control, adjustment of the mA and/or kV according to patient size and/or use of iterative reconstruction technique. CONTRAST:  75mL OMNIPAQUE IOHEXOL 350 MG/ML SOLN COMPARISON:  CTA abdomen and Pelvis 05/16/2022 and earlier. FINDINGS: Lower chest: Negative.  No pericardial or pleural effusion. Hepatobiliary: Negative liver and gallbladder. No bile duct enlargement. Pancreas: Negative. Spleen: Negative. Adrenals/Urinary Tract: Negative. No nephrolithiasis or urinary calculus identified. Pelvic phleboliths. Decompressed ureters and bladder. Stomach/Bowel: Negative large bowel aside from retained stool, occasional sigmoid diverticula. Appendix located in the midline (series 3, image 62 and coronal image 112). Gas in the midportion of the appendix. The distal appendix lumen is opacified, and the diameter is borderline to mildly enlarged 7-8 mm. But there is no adjacent fat stranding. Negative terminal ileum. No dilated small bowel small volume food debris in the stomach. Duodenum appears negative. No free air, free fluid, or mesenteric inflammation identified. Vascular/Lymphatic: Mostly iliac artery calcified atherosclerosis in the abdomen and pelvis. Major arterial structures are patent. Portal venous system appears to be patent. Normal caliber abdominal aorta. No lymphadenopathy. Reproductive: Evidence of a small right fundal uterine fibroid, 2.3 cm on series 3, image 68. This has been present since at least 2021. Ovaries are within normal limits. Other: No pelvis free fluid. Musculoskeletal: Lower lumbar vacuum disc and vacuum facet degeneration. No acute osseous abnormality identified. IMPRESSION:  1. Borderline to mildly enlarged Appendix, but no surrounding inflammation to suggest acute appendicitis. If the clinical course is equivocal repeat CT Abdomen and Pelvis in 12-24 hours with both oral and IV contrast would be recommended. 2. No other acute or inflammatory process identified in the abdomen or pelvis. Small chronic uterine fibroid. Electronically Signed   By: Odessa Fleming M.D.   On: 08/10/2022 04:27    Pending Labs Unresulted Labs (From admission, onward)    None       Vitals/Pain Today's Vitals   08/10/22 0029 08/10/22 0328 08/10/22 0454 08/10/22 0456  BP: (!) 140/89  130/85   Pulse: 84  85   Resp: 18  18   Temp: 98.9 F (37.2 C)  98.8 F (37.1 C)   TempSrc: Oral  Oral   SpO2: 100%  100%   Weight:      Height:      PainSc:  8   7     Isolation Precautions No active isolations  Medications Medications  melatonin tablet 3 mg (has no administration in time range)  ondansetron (ZOFRAN) injection 4 mg (has no administration in time range)  dicyclomine (BENTYL) capsule 10 mg (has no administration in time range)  sacubitril-valsartan (ENTRESTO) 49-51 mg per tablet (has no administration in time range)  lamoTRIgine (LAMICTAL) tablet 150 mg (has no administration in time range)  metoprolol succinate (TOPROL-XL) 24 hr tablet 50 mg (has no administration in time range)  fluticasone furoate-vilanterol (BREO ELLIPTA) 200-25 MCG/ACT 1 puff (has no administration in time range)  polyethylene glycol (MIRALAX / GLYCOLAX) packet 17 g (has no administration in time range)  traMADol (ULTRAM) tablet 50 mg (has no administration in time range)  insulin aspart (novoLOG) injection 0-15 Units (has no administration in time range)  torsemide (DEMADEX) tablet 20 mg (has no administration in time range)  sodium chloride 0.9 % bolus 500 mL (  0 mLs Intravenous Stopped 08/10/22 0405)  fentaNYL (SUBLIMAZE) injection 50 mcg (50 mcg Intravenous Given 08/10/22 0229)  ondansetron (ZOFRAN) injection  4 mg (4 mg Intravenous Given 08/10/22 0228)  iohexol (OMNIPAQUE) 350 MG/ML injection 75 mL (75 mLs Intravenous Contrast Given 08/10/22 0337)  fentaNYL (SUBLIMAZE) injection 50 mcg (50 mcg Intravenous Given 08/10/22 0428)  ondansetron (ZOFRAN) injection 4 mg (4 mg Intravenous Given 08/10/22 0513)    Mobility walks     Focused Assessments     R Recommendations: See Admitting Provider Note  Report given to:   Additional Notes:

## 2022-08-10 NOTE — Hospital Course (Signed)
Now pain is shooting to the right side. Last Friday it started. Pain started epigastric and moved to umbilical area. Present all the time. Sharp pain. No exacerbation factors. Hasn't tried eating anything because it makes her stomach hurt. Other dasy her fever 101. Tuesday. Vomiting because she was trying to drink stuff, but hasn't been able to drink anything. Vomiting started Thursday. No blood. Just clear. Uses ozempic for diabetes. Constipation, no diarrhea. A lot of mucus in her stool. Firm stool. Has tried tylenol, tramadol, advil, and aleve with no relief. No dysuria. No chest pain, no worsening shortness of breath. No sick contacts.   9/10 right now her pain.   Bentyl 10mg   Albuterol venotlin inhalar  Lipitor 40mg   Entresto  Metoprolol Famotidine 20mg  Ferrous sulfate 325  Lamotrigine 150mg   Meclizine 25mg   Zofran  Ozempic 2mg   Symbicort  Torsemid 20mg  BID  Rexulti  Tramadol 50mg    Lives with mom and daughter  Not currently working  Has good support system  Aid that comes in and helps with taking a shower, wash clothes, takes her to the bathroom.  PCP: Dr. Antony Contras  Substance: 32 pack year smoking history, no alcohol use, denies illicit substances

## 2022-08-10 NOTE — H&P (Addendum)
Date: 08/10/2022               Patient Name:  Lindsay [name] MRN: [id number]  DOB: 1970-02-27 Age / Sex: 52 y.o., female   PCP: [name], MD         Medical Service: Internal Medicine Teaching Service         Attending Physician: Dr. Tilden Fossa, MD      First Contact: Dr. Philomena Doheny, MD Pager [phone]    Second Contact: Dr. Morene Crocker, MD Pager [phone]         After Hours (After 5p/  First Contact Pager: [phone]  weekends / holidays): Second Contact Pager: [phone]   SUBJECTIVE   Chief Complaint: "abdominal pain and nausea x 7 days"  History of Present Illness:   This is a 52 year old female presenting to ED for acute abdominal pain that started last Friday. She reports symptoms of sharp abdominal pain, initially starting at the epigastric region and radiating to lower abdomen, that is persistent in nature. She associates symptoms of nausea, non-bloody non-bilious vomiting, and mucous-stools. Denies diarrhea. States her pain is worse today, scales it 9/10 that is radiating to right lower quadrant. She has tried tylenol, aleve, tramadol, and Advil with no relief of her symptoms. She reports fever of 101 on Tuesday that has resolved. Otherwise, denies current chest pains, no acute worsening shortness of breath, dysuria or changes in frequency of output.   She was recently seen at our outpatient clinic, 7/10, for similar symptoms as stated above and treated with supportive care and encouraged PO hydration.    Meds:  Bentyl 10 mg  Albuterol venotlin inhalar  Lipitor 40 mg everyday Entresto 49-51 mg BID  Metoprolol succinate 50 mg daily Ferrous sulfate 325  Lamotrigine 150 mg daily  Zofran 8 mg q8h Ozempic 2 mg  Symbicort  Torsemide 20 mg BID  Rexulti 3 mg daily Tramadol 50mg   Azelastine 0.1% nasal spray  Tessalon Perles Zyrtec 10 mg daily  Doxepin 25 mg at bedtime  Famotidine 20 mg daily Fluticasone 50 mcg/act nasal spray  Meclizine  25 mg TID Megestrol 80 mg daily Meloxicam 7.5 mg 1-2 tablets prn   Tizanidine 4 mg   Past Medical History Acid reflux Iron deficiency anemia HFrEF Chronic kidney disease Major depression disorder, recurrent episode, moderate Anxiety Fibromyalgia Hidradenitis Suppurativa Hypertension Irritable bowel syndrome Migraines Neuromuscular disorder Neuropathy  Panic Attacks Fibroid tumor Menorrhagia COPD Past Surgical History:  Procedure Laterality Date   AXILLARY HIDRADENITIS EXCISION     COLONOSCOPY WITH PROPOFOL N/A 05/25/2015   Procedure: COLONOSCOPY WITH PROPOFOL;  Surgeon: Rachael Fee, MD;  Location: [facility];  Service: Endoscopy;  Laterality: N/A;   COLONOSCOPY WITH PROPOFOL N/A 12/31/2018   Procedure: COLONOSCOPY WITH PROPOFOL;  Surgeon: Rachael Fee, MD;  Location: [facility];  Service: Endoscopy;  Laterality: N/A;   HEMORRHOID SURGERY     with Hidradenitis surgery    INGUINAL HIDRADENITIS EXCISION     TONSILLECTOMY  10/18/2010   by Dr. Annalee Genta   UPPER GASTROINTESTINAL ENDOSCOPY      Social:  Lives With: Mom and daughter  Occupation: Unemployed Support: Mom and daughter  Level of Function: She has aid who comes to her house everyday for 3-4 hours and helps her with showering, food preparation, washing clothes and cleaning the house.  PCP: Dr. Antony Contras  Substances: Former smoker of 29 pack years, no alcohol and substance use  Family History:  Mother: Diabetes, heart disease, anxiety, depression,  hypertension, kidney failure Father: Prostate cancer, and heart disease Sister: depression and anxiety   Allergies: Allergies as of 08/09/2022 - Review Complete 08/09/2022  Allergen Reaction Noted   Lisinopril Rash and Cough 11/06/2015    Review of Systems: A complete ROS was negative except as per HPI.   OBJECTIVE:   Physical Exam: Blood pressure 130/85, pulse 85, temperature 98.8 F (37.1 C), temperature source Oral, resp. rate 18, height 5\' 10"   (1.778 m), weight (!) 146.4 kg, SpO2 100%.  Constitutional: appeared in moderate to severe pain  HENT: normocephalic atraumatic, mucous membranes moist Eyes: conjunctiva non-erythematous Neck: supple Cardiovascular: regular rate and rhythm, no m/r/g, 2+ bilateral radial and dorsal pedis pulses intact. No bilateral lower extremity edema  Pulmonary/Chest: normal work of breathing on room air, lungs clear to auscultation bilaterally Abdominal: soft, non distended, +moderate to severe tenderness to palpation at the periumbilical and RLQ  MSK: normal range of motion  Neurological: alert & oriented x 3, 5/5 strength in bilateral upper and lower extremities, Skin: warm and dry Psych: normal mood and affect  Labs: CBC    Component Value Date/Time   WBC 7.7 08/09/2022 2024   RBC 3.84 (L) 08/09/2022 2024   HGB 10.1 (L) 08/09/2022 2024   HGB 11.4 08/07/2022 1024   HCT 32.6 (L) 08/09/2022 2024   HCT 34.5 08/07/2022 1024   PLT 375 08/09/2022 2024   PLT 421 08/07/2022 1024   MCV 84.9 08/09/2022 2024   MCV 83 08/07/2022 1024   MCH 26.3 08/09/2022 2024   MCHC 31.0 08/09/2022 2024   RDW 15.9 (H) 08/09/2022 2024   RDW 14.3 08/07/2022 1024   LYMPHSABS 2.0 08/07/2022 1024   MONOABS 0.8 05/16/2022 0852   EOSABS 0.2 08/07/2022 1024   BASOSABS 0.0 08/07/2022 1024     CMP     Component Value Date/Time   NA 138 08/09/2022 2024   NA 142 08/07/2022 1024   K 3.4 (L) 08/09/2022 2024   CL 106 08/09/2022 2024   CO2 24 08/09/2022 2024   GLUCOSE 99 08/09/2022 2024   BUN 9 08/09/2022 2024   BUN 12 08/07/2022 1024   CREATININE 1.19 (H) 08/09/2022 2024   CREATININE 0.88 09/15/2013 1032   CALCIUM 8.7 (L) 08/09/2022 2024   PROT 6.4 (L) 08/09/2022 2024   PROT 6.7 08/07/2022 1024   ALBUMIN 3.3 (L) 08/09/2022 2024   ALBUMIN 4.3 08/07/2022 1024   AST 9 (L) 08/09/2022 2024   ALT 10 08/09/2022 2024   ALKPHOS 58 08/09/2022 2024   BILITOT 0.3 08/09/2022 2024   BILITOT <0.2 08/07/2022 1024   GFRNONAA 55  (L) 08/09/2022 2024   GFRAA 67 02/02/2020 0933    Imaging: CT Abd Pelvis showed borderline to mildly enlarged Appendix, but no surrounding inflammation to suggest acute appendicitis. Small chronic uterine fibroid.   EKG: None per this visit   EKG on 05/16/2022 showed sinus rate and rhythm with borderline T abnormalities unchanged from previous EKG   ASSESSMENT & PLAN:   Assessment & Plan by Problem: Active Problems:   * No active hospital problems. *   Danyiah Suk is a 52 y.o. female with past medical history of iron deficiency anemia, acid reflux, heart failure, chronic kidney disease, major depressive disorder, anxiety, panic attacks, fibromyalgia, hypertension, irritable bowel syndrome, migraines, neuromuscular disorder, neuropathy, fibroid tumor, menorrhagia, COPD presented to ED with chief complaints of acute abdominal pain and multiple bouts of vomiting admitted for acute abdominal pain.  Acute Abdominal Pain Suspected Appendicitis  History  of IBS Presented with 7 day history of acute worsening epigastric pain, currently radiating to periumbilical and right lower quadrant, with associated symptoms of nausea, multiple bouts of non bloody, non bilious emesis, and mucous stools. No current fevers or diarrhea. Denies recent sick contacts. CT Abd Pelvis showed borderline to mildly enlarged appendix without inflammation. Low suspicion for acute pancreatitis as her lipase levels are 24. Low suspicion for acute cholecystitis as her ALT levels are 10, WNL. No elevated WBC noted in her CBC, WBC of 7.7. Last known fever per the patient was noted on Tuesday at a temperature of 101, which has resolved. Currently, no fevers or chills. UA positive for small numbers of bilirubin, rare bacteria and mucous, however negative for nitrites and leukocytes. No diarrhea noted per the patient, likely not a viral gastroenteritis. Patient has a history of IBS and imaging showed stool burden which could also be  contributing to her symptoms.   Patient's clinical symptoms are consistent with appendicits, however no inflammation was noted in the imaging, no elevated WBC, and no fevers. She has an Alvarado score of 6, radiology recommended repeat CT scan with oral and IV contrast for further characterization.    Plan: - S/p two 50 mcg sublimaze injection  - S/p 500 mL bolus of NS  - Given one time dose dilaudid for moderate to severe pain. Held tramadol.  - Zofran for nausea - Consider repeat CT if symptoms do not resolve - Consider surgery consult    Hypokalemia Presented with K of 3.4, likely in the setting of emesis and decrease PO intake. No EKG was done in this visit. Patient denies any acute chest pain or shortness of breath.   Plan: - Replete potassium and recheck   - Follow up on EKG   Normocytic Anemia History of Iron deficiency anemia secondary to menorrhagia with irregular cycle Presented with Hgb of 10.1. Last noted iron studies on 01/09/22 that showed TIBC 284, Iron 54, Ferritin 166. Last colonoscopy done on 12/31/2018 that showed diverticulosis in the left colon, no polyps. She takes ferrous sulfate 325 daily. Currently, no acute signs and symptoms of bleeding. Will monitor Hgb.   History of COPD She has chronic shortness of breath at baseline. Currently, denies worsening of shortness of breath or cough.  - Will continue home medication Breo Ellipta   Type II diabetes controlled on insulin Last Hgb A1c on 07/15/2022 was 5.6.  - Will continue with insulin sliding scale   HFpEF Last Echo on 11/26/2019 showed LVEF 45-50%. Currently no signs and symptmoms of fluids overload. - Will continue home regimen Entresto 49-51 mg, Metoprolol succinate 50 mg, and torsemide 20 mg  Hypertension Current blood pressure of 143/74 likely elevated in the setting of acute pain. - Will continue Metoprolol succinate and monitor pressures   Fibromyalgia Neuromuscular disorder Continue home meds  tramadol 50 mg  Major depressive disorder, recurrent episode, moderate History of anxiety History of panic attacks Will continue home medications Lamictal and Rexulti  Diet: NPO VTE: SCDs IVF:  None ,None Code: Full  Prior to Admission Living Arrangement: Home, living Mother and daughter Anticipated Discharge Location: Home Barriers to Discharge: Medical Management  Dispo: Admit patient to Observation with expected length of stay less than 2 midnights.  Signed: Jeral Pinch, DO Internal Medicine Resident PGY-1  08/10/2022, 5:12 AM

## 2022-08-10 NOTE — Telephone Encounter (Signed)
Patient called the on call service line at 12:25 last night requesting a higher dose of bentyl.  She is taking 10 mg currently.  I advised her it was okay to take 2 tabs instead of 1.  She can contact us for a refill when needed.

## 2022-08-10 NOTE — Progress Notes (Signed)
NEW ADMISSION NOTE   Arrival Method: ED stretcher Mental Orientation: AAOx4 Telemetry: NA Assessment: Completed Skin: See flowsheet IV: LFA Pain: 10/10 Tubes: n/a Safety Measures: Safety Fall Prevention Plan has been given, discussed and signed Admission: Completed [facility] Orientation: Patient has been orientated to the room, unit and staff.  Family: none at bedside   Orders have been reviewed and implemented. Will continue to monitor the patient. Call light has been placed within reach and bed alarm has been activated.

## 2022-08-10 NOTE — ED Provider Notes (Signed)
[place] EMERGENCY DEPARTMENT AT [facility] Provider Note   CSN: [id number] Arrival date & time: 08/09/22  1952     History  Chief Complaint  Patient presents with   Abdominal Pain   Knee Pain    Lindsay [name] is a 52 y.o. female.  The history is provided by the patient and medical records.  Abdominal Pain Knee Pain Lindsay [name] is a 52 y.o. female who presents to the Emergency Department complaining of abdominal pain.  She presents to the emergency department for evaluation of epigastric abdominal pain that started on Friday last week.  She has associated nausea, emesis and diarrhea.  No hematochezia or hematemesis.  She reports fever to 101 on Wednesday and Thursday.  No dysuria.  No chest pain, difficulty breathing.  She has a history of hypertension, diabetes, CHF.  She recently presented to the internal medicine clinic for similar symptoms as referred to the emergency department due to elevation in her creatinine.  Overall she states that her symptoms are worsening.     Home Medications Prior to Admission medications   Medication Sig Start Date End Date Taking? Authorizing Provider  dicyclomine (BENTYL) 10 MG capsule Take 1 capsule (10 mg total) by mouth 3 (three) times daily as needed for spasms (abdominal pain). 08/03/22   Meredith Pel, NP  ACCU-CHEK GUIDE test strip USE ONE TEST STRIP TO CHECK BLOOD SUGARS ONCE A DAY AS NEEDED 11/06/21   [name], MD  Accu-Chek Softclix Lancets lancets USE ONE LANCET TO CHECK BLOOD SUGAR ONE A DAY AS NEEDED 07/03/22   [name], MD  albuterol (PROVENTIL) (2.5 MG/3ML) 0.083% nebulizer solution TAKE 3 ML (2.5 MG TOTAL) BY NEBULIZATION EVERY 4 HOURS AS NEEDED FOR WHEEZING OR SHORTNESS OF BREATH Patient not taking: Reported on 06/10/2022 07/11/21   Virl Diamond A, MD  albuterol (VENTOLIN HFA) 108 (90 Base) MCG/ACT inhaler TAKE 2 PUFFS BY MOUTH EVERY 6 HOURS AS NEEDED FOR WHEEZE OR SHORTNESS OF BREATH Patient not  taking: Reported on 06/10/2022 05/20/22   Tomma Lightning, MD  Ascorbic Acid (VITAMIN C PO) Take 1 tablet by mouth daily.    [provider]  atorvastatin (LIPITOR) 40 MG tablet TAKE 1 TABLET BY MOUTH EVERY DAY 04/30/22   [name], MD  azelastine (ASTELIN) 0.1 % nasal spray Place 1 spray into both nostrils 2 (two) times daily. Use in each nostril as directed 01/17/22   Marrianne Mood, MD  benzonatate (TESSALON PERLES) 100 MG capsule Take 1 capsule (100 mg total) by mouth 3 (three) times daily as needed for cough. 06/23/22   Evlyn Kanner, MD  Blood Glucose Monitoring Suppl (ACCU-CHEK GUIDE ME) w/Device KIT Dispense one device 02/16/20   [name], MD  Brexpiprazole (REXULTI) 3 MG TABS Take 1 tablet (3 mg total) by mouth daily. 08/05/22   [name], Phillips Grout, MD  Capsaicin 0.025 % PTCH Apply 1 patch topically daily as needed. 02/16/20   [name], MD  cetirizine (ZYRTEC) 10 MG tablet Take 1 tablet (10 mg total) by mouth daily. 08/05/22   [name], MD  clindamycin (CLINDAGEL) 1 % gel APPLY TO AFFECTED AREA TWICE A DAY 07/24/22   Tyson Alias, MD  [employer] D3 25 MCG (1000 UT) capsule TAKE 1 CAPSULE BY MOUTH EVERY DAY 02/26/22   [name], MD  doxepin (SINEQUAN) 25 MG capsule Take 1 capsule (25 mg total) by mouth at bedtime. 08/05/22   [name], Phillips Grout, MD  doxycycline (VIBRAMYCIN) 100 MG capsule  Take 1 capsule (100 mg total) by mouth 2 (two) times daily. 06/10/22   Reva Bores, MD  ENTRESTO 49-51 MG Take 1 tablet by mouth 2 (two) times daily. 05/31/22 08/29/22  Mercie Eon, MD  famotidine (PEPCID) 20 MG tablet Take 1 tablet (20 mg total) by mouth daily. 08/05/22   [name], MD  ferrous sulfate 325 (65 FE) MG EC tablet Take 1 tablet (325 mg total) by mouth daily with breakfast. 05/24/21 05/24/22  [name], MD  fluticasone (FLONASE) 50 MCG/ACT nasal spray INSTILL 1 SPRAY INTO BOTH NOSTRILS DAILY 06/27/22   [name], MD  ipratropium  (ATROVENT) 0.06 % nasal spray PLEASE SEE ATTACHED FOR DETAILED DIRECTIONS 04/14/20   [provider]  lamoTRIgine (LAMICTAL) 150 MG tablet Take 1 tablet (150 mg total) by mouth daily. 08/05/22   [name], Phillips Grout, MD  lidocaine (LIDODERM) 5 % PLACE 2 PATCHES ONTO THE SKIN DAILY. REMOVE & DISCARD PATCH WITHIN 12 HOURS OR AS DIRECTED BY MD 06/27/22   [name], MD  meclizine (ANTIVERT) 25 MG tablet Take 1 tablet (25 mg total) by mouth 3 (three) times daily as needed for dizziness. 08/05/22   [name], MD  megestrol (MEGACE) 40 MG tablet Take 2 tablets (80 mg total) by mouth daily. 06/10/22   Reva Bores, MD  meloxicam (MOBIC) 7.5 MG tablet Take 1-2 tablets (7.5-15 mg) by mouth daily as needed. 08/08/22   [name], MD  metoprolol succinate (TOPROL-XL) 50 MG 24 hr tablet Take 1 tablet (50 mg total) by mouth daily. 05/23/21   [name], MD  metroNIDAZOLE (FLAGYL) 500 MG tablet Take 1 tablet (500 mg total) by mouth 2 (two) times daily. 07/23/22   Reva Bores, MD  ondansetron (ZOFRAN-ODT) 8 MG disintegrating tablet Take 1 tablet (8 mg total) by mouth every 8 (eight) hours as needed for nausea or vomiting. 07/27/22   Rana Snare, DO  Semaglutide, 2 MG/DOSE, (OZEMPIC, 2 MG/DOSE,) 8 MG/3ML SOPN Inject 2 mg into the skin once a week. 08/05/22   [name], MD  SYMBICORT 160-4.5 MCG/ACT inhaler INHALE 2 PUFFS INTO THE LUNGS IN THE MORNING AND AT BEDTIME. 03/28/22   Olalere, Adewale A, MD  tiZANidine (ZANAFLEX) 4 MG tablet TAKE 1 TABLET (4 MG TOTAL) BY MOUTH EVERY 8 (EIGHT) HOURS AS NEEDED FOR MUSCLE SPASMS 06/27/22 06/27/23  [name], MD  torsemide (DEMADEX) 20 MG tablet Take 2 tablets (40 mg total) by mouth 2 (two) times daily. 08/05/22   [name], MD  traMADol (ULTRAM) 50 MG tablet Take 1 tablet (50 mg total) by mouth every 4 (four) hours as needed. 08/07/22   [name], MD  vitamin B-12 (CYANOCOBALAMIN) 1000 MCG tablet Take 1 tablet (1,000 mcg  total) by mouth daily. 05/06/17   [name], MD  Vitamin E 400 units TABS Take 400 Units by mouth daily.     [provider]      Allergies    Lisinopril    Review of Systems   Review of Systems  Gastrointestinal:  Positive for abdominal pain.  All other systems reviewed and are negative.   Physical Exam Updated Vital Signs BP (!) 143/74 (BP Location: Right Arm)   Pulse 87   Temp 98.2 F (36.8 C)   Resp 16   Ht 5\' 10"  (1.778 m)   Wt (!) 148.1 kg   SpO2 100%   BMI 46.85 kg/m  Physical Exam Vitals and nursing note reviewed.  Constitutional:  Appearance: She is well-developed.  HENT:     Head: Normocephalic and atraumatic.  Cardiovascular:     Rate and Rhythm: Normal rate and regular rhythm.  Pulmonary:     Effort: Pulmonary effort is normal. No respiratory distress.  Abdominal:     Palpations: Abdomen is soft.     Tenderness: There is no guarding or rebound.     Comments: Mild generalized abdominal tenderness  Musculoskeletal:        General: No tenderness.  Skin:    General: Skin is warm and dry.  Neurological:     Mental Status: She is alert and oriented to person, place, and time.  Psychiatric:        Behavior: Behavior normal.     ED Results / Procedures / Treatments   Labs (all labs ordered are listed, but only abnormal results are displayed) Labs Reviewed  COMPREHENSIVE METABOLIC PANEL - Abnormal; Notable for the following components:      Result Value   Potassium 3.4 (*)    Creatinine, Ser 1.19 (*)    Calcium 8.7 (*)    Total Protein 6.4 (*)    Albumin 3.3 (*)    AST 9 (*)    GFR, Estimated 55 (*)    All other components within normal limits  CBC - Abnormal; Notable for the following components:   RBC 3.84 (*)    Hemoglobin 10.1 (*)    HCT 32.6 (*)    RDW 15.9 (*)    All other components within normal limits  URINALYSIS, ROUTINE W REFLEX MICROSCOPIC - Abnormal; Notable for the following components:   Color, Urine AMBER (*)     APPearance CLOUDY (*)    Specific Gravity, Urine >1.046 (*)    Bilirubin Urine SMALL (*)    Protein, ur 100 (*)    Bacteria, UA RARE (*)    Non Squamous Epithelial 0-5 (*)    All other components within normal limits  LIPASE, BLOOD  HCG, SERUM, QUALITATIVE  DIFFERENTIAL  C-REACTIVE PROTEIN    EKG None  Radiology CT ABDOMEN PELVIS W CONTRAST  Result Date: 08/10/2022 CLINICAL DATA:  52 year old female with 24 hours of abdominal pain. EXAM: CT ABDOMEN AND PELVIS WITH CONTRAST TECHNIQUE: Multidetector CT imaging of the abdomen and pelvis was performed using the standard protocol following bolus administration of intravenous contrast. RADIATION DOSE REDUCTION: This exam was performed according to the departmental dose-optimization program which includes automated exposure control, adjustment of the mA and/or kV according to patient size and/or use of iterative reconstruction technique. CONTRAST:  75mL OMNIPAQUE IOHEXOL 350 MG/ML SOLN COMPARISON:  CTA abdomen and Pelvis 05/16/2022 and earlier. FINDINGS: Lower chest: Negative.  No pericardial or pleural effusion. Hepatobiliary: Negative liver and gallbladder. No bile duct enlargement. Pancreas: Negative. Spleen: Negative. Adrenals/Urinary Tract: Negative. No nephrolithiasis or urinary calculus identified. Pelvic phleboliths. Decompressed ureters and bladder. Stomach/Bowel: Negative large bowel aside from retained stool, occasional sigmoid diverticula. Appendix located in the midline (series 3, image 62 and coronal image 112). Gas in the midportion of the appendix. The distal appendix lumen is opacified, and the diameter is borderline to mildly enlarged 7-8 mm. But there is no adjacent fat stranding. Negative terminal ileum. No dilated small bowel small volume food debris in the stomach. Duodenum appears negative. No free air, free fluid, or mesenteric inflammation identified. Vascular/Lymphatic: Mostly iliac artery calcified atherosclerosis in the  abdomen and pelvis. Major arterial structures are patent. Portal venous system appears to be patent. Normal caliber abdominal aorta. No  lymphadenopathy. Reproductive: Evidence of a small right fundal uterine fibroid, 2.3 cm on series 3, image 68. This has been present since at least 2021. Ovaries are within normal limits. Other: No pelvis free fluid. Musculoskeletal: Lower lumbar vacuum disc and vacuum facet degeneration. No acute osseous abnormality identified. IMPRESSION: 1. Borderline to mildly enlarged Appendix, but no surrounding inflammation to suggest acute appendicitis. If the clinical course is equivocal repeat CT Abdomen and Pelvis in 12-24 hours with both oral and IV contrast would be recommended. 2. No other acute or inflammatory process identified in the abdomen or pelvis. Small chronic uterine fibroid. Electronically Signed   By: Odessa Fleming M.D.   On: 08/10/2022 04:27    Procedures Procedures    Medications Ordered in ED Medications  melatonin tablet 3 mg (has no administration in time range)  ondansetron (ZOFRAN) injection 4 mg (has no administration in time range)  dicyclomine (BENTYL) capsule 10 mg (has no administration in time range)  sacubitril-valsartan (ENTRESTO) 49-51 mg per tablet (has no administration in time range)  lamoTRIgine (LAMICTAL) tablet 150 mg (has no administration in time range)  metoprolol succinate (TOPROL-XL) 24 hr tablet 50 mg (has no administration in time range)  fluticasone furoate-vilanterol (BREO ELLIPTA) 200-25 MCG/ACT 1 puff (has no administration in time range)  polyethylene glycol (MIRALAX / GLYCOLAX) packet 17 g (has no administration in time range)  insulin aspart (novoLOG) injection 0-15 Units (has no administration in time range)  torsemide (DEMADEX) tablet 20 mg (has no administration in time range)  potassium chloride 10 mEq in 100 mL IVPB (has no administration in time range)  HYDROmorphone (DILAUDID) injection 0.5 mg (has no administration in  time range)  brexpiprazole (REXULTI) tablet 3 mg (has no administration in time range)  sodium chloride 0.9 % bolus 500 mL (0 mLs Intravenous Stopped 08/10/22 0405)  fentaNYL (SUBLIMAZE) injection 50 mcg (50 mcg Intravenous Given 08/10/22 0229)  ondansetron (ZOFRAN) injection 4 mg (4 mg Intravenous Given 08/10/22 0228)  iohexol (OMNIPAQUE) 350 MG/ML injection 75 mL (75 mLs Intravenous Contrast Given 08/10/22 0337)  fentaNYL (SUBLIMAZE) injection 50 mcg (50 mcg Intravenous Given 08/10/22 0428)  ondansetron (ZOFRAN) injection 4 mg (4 mg Intravenous Given 08/10/22 1610)    ED Course/ Medical Decision Making/ A&P                             Medical Decision Making Amount and/or Complexity of Data Reviewed Radiology: ordered.  Risk Prescription drug management. Decision regarding hospitalization.   Patient here for evaluation of abdominal pain, vomiting as well as 1 loose stool daily.  She does report fevers at home.  When she describes her pain this is more epigastric radiating to the back.  She has generalized tenderness on exam but does not localize to one particular region.  Labs with mild elevation in her creatinine when compared to baseline, UA is concentrated but not consistent with UTI.  CBC with anemia, no reported bleeding at home.  CT scan was obtained, which is equivocal for acute appendicitis although she does not currently have focal right lower quadrant tenderness.  Given patient's recurrent nausea in the emergency department plan for observation and admission with IV fluid hydration.  Internal medicine consulted for admission.  Patient updated of findings of studies and she is in agreement with admission for ongoing care.       Final Clinical Impression(s) / ED Diagnoses Final diagnoses:  Generalized abdominal pain  Dehydration  Rx / DC Orders ED Discharge Orders     None         Tilden Fossa, MD 08/10/22 0710

## 2022-08-10 NOTE — Plan of Care (Signed)
  Problem: Coping: Goal: Ability to adjust to condition or change in health will improve Outcome: Progressing   

## 2022-08-10 NOTE — ED Notes (Signed)
Patient transported to CT 

## 2022-08-10 NOTE — Progress Notes (Addendum)
Hospital day#0 Subjective:   Summary: Maddix Cotherman is a 52 yo living with HFpEF, T2DM, depression and anxiety, fibromyalgia on chronic opiates,  HTN, IBS presenting for a week of progressive periumbilical pain associated with nausea, vomiting and poor po intake and mucous stools initially treated as viral gastroenteritides admitted to the hospital for same currently being evaluated for possible appendicitis  Overnight Events: No acute events since admission  Patient reports that she has been with worsening abdominal pain since she was last seen in the clinic. Pain has minimally improved since she was admitted. However, nausea and vomiting has improved.   Objective:  Vital signs in last 24 hours: Vitals:   08/10/22 0629 08/10/22 0634 08/10/22 0905 08/10/22 0912  BP: (!) 143/74   124/82  Pulse: 87  88 88  Resp: 16  16 18   Temp: 98.2 F (36.8 C)   98 F (36.7 C)  TempSrc:      SpO2: 100%  100% 100%  Weight:  (!) 148.1 kg    Height:       Supplemental O2: Room Air SpO2: 100 % Filed Weights   08/09/22 2013 08/10/22 0634  Weight: (!) 146.4 kg (!) 148.1 kg    Physical Exam:  Constitutional: well-appearing woman sitting in bed, in mild distress HENT: normocephalic atraumatic, moist mucous membranes Cardiovascular: regular rate and rhythm, no m/r/g Pulmonary/Chest: normal work of breathing on room air, lungs clear to auscultation bilaterally. No wheezing Abdominal: present BS, soft, non distended with tenderness to palpation periumbilical and RLQ area without rebound tenderness or  MSK: normal bulk and tone. No pitting edema Neurological: alert & oriented x 3, moving all extremities. Psych: Pleasant mood and affect    Intake/Output Summary (Last 24 hours) at 08/10/2022 1425 Last data filed at 08/10/2022 0900 Gross per 24 hour  Intake 240 ml  Output 0 ml  Net 240 ml   Net IO Since Admission: 240 mL [08/10/22 1425]  Pertinent Labs:    Latest Ref Rng & Units 08/10/2022     8:20 AM 08/09/2022    8:24 PM 08/07/2022   10:24 AM  CBC  WBC 4.0 - 10.5 K/uL 8.8  7.7  6.7   Hemoglobin 12.0 - 15.0 g/dL 86.5  78.4  69.6   Hematocrit 36.0 - 46.0 % 35.8  32.6  34.5   Platelets 150 - 400 K/uL 367  375  421        Latest Ref Rng & Units 08/09/2022    8:24 PM 08/07/2022   10:24 AM 05/16/2022    8:58 AM  CMP  Glucose 70 - 99 mg/dL 99  93  88   BUN 6 - 20 mg/dL 9  12  9    Creatinine 0.44 - 1.00 mg/dL 2.95  2.84  1.32   Sodium 135 - 145 mmol/L 138  142  143   Potassium 3.5 - 5.1 mmol/L 3.4  4.0  3.6   Chloride 98 - 111 mmol/L 106  105  114   CO2 22 - 32 mmol/L 24  21    Calcium 8.9 - 10.3 mg/dL 8.7  9.5    Total Protein 6.5 - 8.1 g/dL 6.4  6.7    Total Bilirubin 0.3 - 1.2 mg/dL 0.3  <4.4    Alkaline Phos 38 - 126 U/L 58  77    AST 15 - 41 U/L 9  9    ALT 0 - 44 U/L 10  8      Imaging:  CT ABDOMEN PELVIS W CONTRAST  Result Date: 08/10/2022 CLINICAL DATA:  52 year old female with 24 hours of abdominal pain. EXAM: CT ABDOMEN AND PELVIS WITH CONTRAST TECHNIQUE: Multidetector CT imaging of the abdomen and pelvis was performed using the standard protocol following bolus administration of intravenous contrast. RADIATION DOSE REDUCTION: This exam was performed according to the departmental dose-optimization program which includes automated exposure control, adjustment of the mA and/or kV according to patient size and/or use of iterative reconstruction technique. CONTRAST:  75mL OMNIPAQUE IOHEXOL 350 MG/ML SOLN COMPARISON:  CTA abdomen and Pelvis 05/16/2022 and earlier. FINDINGS: Lower chest: Negative.  No pericardial or pleural effusion. Hepatobiliary: Negative liver and gallbladder. No bile duct enlargement. Pancreas: Negative. Spleen: Negative. Adrenals/Urinary Tract: Negative. No nephrolithiasis or urinary calculus identified. Pelvic phleboliths. Decompressed ureters and bladder. Stomach/Bowel: Negative large bowel aside from retained stool, occasional sigmoid diverticula.  Appendix located in the midline (series 3, image 62 and coronal image 112). Gas in the midportion of the appendix. The distal appendix lumen is opacified, and the diameter is borderline to mildly enlarged 7-8 mm. But there is no adjacent fat stranding. Negative terminal ileum. No dilated small bowel small volume food debris in the stomach. Duodenum appears negative. No free air, free fluid, or mesenteric inflammation identified. Vascular/Lymphatic: Mostly iliac artery calcified atherosclerosis in the abdomen and pelvis. Major arterial structures are patent. Portal venous system appears to be patent. Normal caliber abdominal aorta. No lymphadenopathy. Reproductive: Evidence of a small right fundal uterine fibroid, 2.3 cm on series 3, image 68. This has been present since at least 2021. Ovaries are within normal limits. Other: No pelvis free fluid. Musculoskeletal: Lower lumbar vacuum disc and vacuum facet degeneration. No acute osseous abnormality identified. IMPRESSION: 1. Borderline to mildly enlarged Appendix, but no surrounding inflammation to suggest acute appendicitis. If the clinical course is equivocal repeat CT Abdomen and Pelvis in 12-24 hours with both oral and IV contrast would be recommended. 2. No other acute or inflammatory process identified in the abdomen or pelvis. Small chronic uterine fibroid. Electronically Signed   By: Odessa Fleming M.D.   On: 08/10/2022 04:27    Assessment/Plan:   Active Problems:   Essential hypertension   IBS (irritable bowel syndrome)   Fibromyalgia   Abdominal pain   Elevated serum creatinine   Abdominal pain Nausea and vomiting IBS No major improvement since admission. No fever or leukocytosis on admission. Liver enzymes and alk phosphatase wnl. Differential at this time include gastroenteritis vs resolving colitis vs appendicitis. CT AP with IV contrast currently concerning for appendicitis given enlarged appendix and gas in the midportion of the appendix without  surrounding inflammation. Recommendation to repeat imaging with oral and IV contrast in the setting of equivocal clinical picture. Will continue to manage pain, nausea, and vomiting until able to get repeat study. - CT AP with oral and IV contrast -Pain control with home medications -EKG with QTC 459 on admission: compazine 10 mg q6HR PRN -Continue Bentyl  -Consult surgery for potential intervention vs nonsurgical management if + for appendicitis  Prerenal AKI, improving Likely in settting of GI losses -CTM RFP  -Fluid resuscitation with 500 cc D5LR bolus this AM  Fibromyalgia Chronic opioid use Concern for withdrawal in setting of misplacing medications in late June, now out of the window. Patient has organic reasons to be treated with pain medication in the acute setting. Will resume her PTA medications -Resume Tramadol 50 mg, muscle relaxants, and lidocaine patches  HFrEF Resumed GDMT on admission.  No evidence on volume overload this AM. Will continue to monitor as she is stil NPO and receiving gentle hydration with IV fluids - Continue Entresto, Toprol 50 mg - Holding Torsemide until 7/14 in the AM  DM -CBG q4 hrs -SSI insulin  Diet: NPO VTE: SCDs Code: Full PT/OT recs: None   Dispo: Anticipated discharge to Home in {1-2 days awaiting further clinical work up  Morene Crocker, MD Internal Medicine Resident PGY-2 Please contact the on call pager after 5 pm and on weekends at [phone].

## 2022-08-11 DIAGNOSIS — R1013 Epigastric pain: Secondary | ICD-10-CM | POA: Diagnosis not present

## 2022-08-11 LAB — CBC
HCT: 29.6 % — ABNORMAL LOW (ref 36.0–46.0)
Hemoglobin: 9.3 g/dL — ABNORMAL LOW (ref 12.0–15.0)
MCH: 26.2 pg (ref 26.0–34.0)
MCHC: 31.4 g/dL (ref 30.0–36.0)
MCV: 83.4 fL (ref 80.0–100.0)
Platelets: 343 10*3/uL (ref 150–400)
RBC: 3.55 MIL/uL — ABNORMAL LOW (ref 3.87–5.11)
RDW: 15.9 % — ABNORMAL HIGH (ref 11.5–15.5)
WBC: 5.8 10*3/uL (ref 4.0–10.5)
nRBC: 0 % (ref 0.0–0.2)

## 2022-08-11 LAB — COMPREHENSIVE METABOLIC PANEL
ALT: 10 U/L (ref 0–44)
AST: 9 U/L — ABNORMAL LOW (ref 15–41)
Albumin: 3 g/dL — ABNORMAL LOW (ref 3.5–5.0)
Alkaline Phosphatase: 52 U/L (ref 38–126)
Anion gap: 7 (ref 5–15)
BUN: 6 mg/dL (ref 6–20)
CO2: 23 mmol/L (ref 22–32)
Calcium: 8.3 mg/dL — ABNORMAL LOW (ref 8.9–10.3)
Chloride: 109 mmol/L (ref 98–111)
Creatinine, Ser: 1.02 mg/dL — ABNORMAL HIGH (ref 0.44–1.00)
GFR, Estimated: >60 mL/min (ref >60)
Glucose, Bld: 123 mg/dL — ABNORMAL HIGH (ref 70–99)
Potassium: 3.9 mmol/L (ref 3.5–5.1)
Sodium: 139 mmol/L (ref 135–145)
Total Bilirubin: 0.2 mg/dL — ABNORMAL LOW (ref 0.3–1.2)
Total Protein: 5.7 g/dL — ABNORMAL LOW (ref 6.5–8.1)

## 2022-08-11 LAB — GLUCOSE, CAPILLARY
Glucose-Capillary: 83 mg/dL (ref 70–99)
Glucose-Capillary: 99 mg/dL (ref 70–99)

## 2022-08-11 MED ORDER — SENNA 8.6 MG PO TABS: 2.0000 | ORAL_TABLET | Freq: Two times a day (BID) | ORAL | 0 refills | Status: DC

## 2022-08-11 MED ORDER — SUMATRIPTAN SUCCINATE 50 MG PO TABS: 50.0000 mg | ORAL_TABLET | Freq: Once | ORAL | Status: DC

## 2022-08-11 MED ORDER — DICYCLOMINE HCL 10 MG PO CAPS: 20.0000 mg | ORAL_CAPSULE | Freq: Three times a day (TID) | ORAL | 1 refills | Status: DC | PRN

## 2022-08-11 MED ORDER — ACETAMINOPHEN 500 MG PO TABS
1000.0000 mg | ORAL_TABLET | Freq: Three times a day (TID) | ORAL | Status: DC | PRN
  Administered 2022-08-11: 1000 mg via ORAL
  Filled 2022-08-11: qty 2

## 2022-08-11 MED ORDER — SUMATRIPTAN SUCCINATE 100 MG PO TABS
100.0000 mg | ORAL_TABLET | Freq: Once | ORAL | Status: DC
  Filled 2022-08-11: qty 1

## 2022-08-11 MED ORDER — SORBITOL 70 % SOLN
15.0000 mL | Freq: Once | Status: AC
  Administered 2022-08-11: 15 mL via ORAL
  Filled 2022-08-11: qty 30

## 2022-08-11 MED ORDER — SUMATRIPTAN SUCCINATE 100 MG PO TABS
100.0000 mg | ORAL_TABLET | ORAL | Status: AC | PRN
  Administered 2022-08-11 (×2): 100 mg via ORAL
  Filled 2022-08-11 (×3): qty 1

## 2022-08-11 MED ORDER — SENNA 8.6 MG PO TABS
2.0000 | ORAL_TABLET | Freq: Two times a day (BID) | ORAL | Status: DC
  Administered 2022-08-11: 17.2 mg via ORAL
  Filled 2022-08-11: qty 2

## 2022-08-11 MED ORDER — SUMATRIPTAN SUCCINATE 50 MG PO TABS: 50.0000 mg | ORAL_TABLET | ORAL | 0 refills | Status: DC | PRN

## 2022-08-11 MED ORDER — SUMATRIPTAN SUCCINATE 100 MG PO TABS: 100.0000 mg | ORAL_TABLET | Freq: Once | ORAL | Status: DC | PRN

## 2022-08-11 MED ORDER — POLYETHYLENE GLYCOL 3350 17 G PO PACK: 17.0000 g | PACK | Freq: Every day | ORAL | 0 refills | Status: DC

## 2022-08-11 MED ORDER — PROCHLORPERAZINE MALEATE 5 MG PO TABS: 5.0000 mg | ORAL_TABLET | Freq: Three times a day (TID) | ORAL | 0 refills | Status: DC | PRN

## 2022-08-11 NOTE — Discharge Summary (Addendum)
Name: Lindsay [name] MRN: [id number] DOB: 1970/12/11 52 y.o. PCP: [name], Lindsay [name]  Date of Admission: 08/09/2022  8:00 PM Date of Discharge: 08/11/2022 9:47 AM Attending Physician: Dr. Sol Blazing  Discharge Diagnosis: Active Problems:   Essential hypertension   IBS (irritable bowel syndrome)   Fibromyalgia   Abdominal pain   Elevated serum creatinine    Discharge Medications: Allergies as of 08/11/2022       Reactions   Lisinopril Rash, Cough        Medication List     STOP taking these medications    ondansetron 8 MG disintegrating tablet Commonly known as: ZOFRAN-ODT       TAKE these medications    Accu-Chek Guide Me w/Device Kit Dispense one device   Accu-Chek Guide test strip Generic drug: glucose blood USE ONE TEST STRIP TO CHECK BLOOD SUGARS ONCE A DAY AS NEEDED   Accu-Chek Softclix Lancets lancets USE ONE LANCET TO CHECK BLOOD SUGAR ONE A DAY AS NEEDED   albuterol (2.5 MG/3ML) 0.083% nebulizer solution Commonly known as: PROVENTIL TAKE 3 ML (2.5 MG TOTAL) BY NEBULIZATION EVERY 4 HOURS AS NEEDED FOR WHEEZING OR SHORTNESS OF BREATH   albuterol 108 (90 Base) MCG/ACT inhaler Commonly known as: VENTOLIN HFA TAKE 2 PUFFS BY MOUTH EVERY 6 HOURS AS NEEDED FOR WHEEZE OR SHORTNESS OF BREATH   atorvastatin 40 MG tablet Commonly known as: LIPITOR TAKE 1 TABLET BY MOUTH EVERY DAY   azelastine 0.1 % nasal spray Commonly known as: ASTELIN Place 1 spray into both nostrils 2 (two) times daily. Use in each nostril as directed   cetirizine 10 MG tablet Commonly known as: ZYRTEC Take 1 tablet (10 mg total) by mouth daily.   clindamycin 1 % gel Commonly known as: CLINDAGEL APPLY TO AFFECTED AREA TWICE A DAY What changed: See the new instructions.   [facility] D3 25 MCG (1000 UT) capsule Generic drug: Cholecalciferol TAKE 1 CAPSULE BY MOUTH EVERY DAY   diclofenac 75 MG EC tablet Commonly known as: VOLTAREN Take 75 mg by mouth 2 (two) times daily.    dicyclomine 10 MG capsule Commonly known as: BENTYL Take 2 capsules (20 mg total) by mouth 3 (three) times daily as needed for spasms (abdominal pain). What changed: how much to take   doxepin 25 MG capsule Commonly known as: SINEQUAN Take 1 capsule (25 mg total) by mouth at bedtime.   Entresto 49-51 MG Generic drug: sacubitril-valsartan Take 1 tablet by mouth 2 (two) times daily.   famotidine 20 MG tablet Commonly known as: PEPCID Take 1 tablet (20 mg total) by mouth daily.   ferrous sulfate 325 (65 FE) MG EC tablet Take 1 tablet (325 mg total) by mouth daily with breakfast. What changed: when to take this   fluticasone 50 MCG/ACT nasal spray Commonly known as: FLONASE INSTILL 1 SPRAY INTO BOTH NOSTRILS DAILY What changed: See the new instructions.   ipratropium 0.06 % nasal spray Commonly known as: ATROVENT PLEASE SEE ATTACHED FOR DETAILED DIRECTIONS   lamoTRIgine 150 MG tablet Commonly known as: LaMICtal Take 1 tablet (150 mg total) by mouth daily.   meclizine 25 MG tablet Commonly known as: ANTIVERT Take 1 tablet (25 mg total) by mouth 3 (three) times daily as needed for dizziness.   megestrol 40 MG tablet Commonly known as: MEGACE Take 2 tablets (80 mg total) by mouth daily.   meloxicam 7.5 MG tablet Commonly known as: Mobic Take 1-2 tablets (7.5-15 mg) by mouth daily as needed.  metoprolol succinate 50 MG 24 hr tablet Commonly known as: TOPROL-XL Take 1 tablet (50 mg total) by mouth daily.   omeprazole 40 MG capsule Commonly known as: PRILOSEC Take 40 mg by mouth daily.   Ozempic (2 MG/DOSE) 8 MG/3ML Sopn Generic drug: Semaglutide (2 MG/DOSE) Inject 2 mg into the skin once a week.   polyethylene glycol 17 g packet Commonly known as: MIRALAX / GLYCOLAX Take 17 g by mouth daily.   prochlorperazine 5 MG tablet Commonly known as: COMPAZINE Take 1 tablet (5 mg total) by mouth every 8 (eight) hours as needed for up to 2 days for nausea or vomiting.    Rexulti 3 MG Tabs Generic drug: Brexpiprazole Take 1 tablet (3 mg total) by mouth daily.   senna 8.6 MG Tabs tablet Commonly known as: SENOKOT Take 2 tablets (17.2 mg total) by mouth 2 (two) times daily.   SUMAtriptan 50 MG tablet Commonly known as: Imitrex Take 1 tablet (50 mg total) by mouth every 2 (two) hours as needed for up to 10 doses for migraine. May repeat in 2 hours if headache persists or recurs.   tiZANidine 4 MG tablet Commonly known as: ZANAFLEX TAKE 1 TABLET (4 MG TOTAL) BY MOUTH EVERY 8 (EIGHT) HOURS AS NEEDED FOR MUSCLE SPASMS   torsemide 20 MG tablet Commonly known as: DEMADEX Take 2 tablets (40 mg total) by mouth 2 (two) times daily.        Disposition and follow-up:   Lindsay [name] was discharged from [facility] in Stable condition.  At the hospital follow up visit please address:  1.  Follow-up:  *IBS *Constipation -Monitor for improvement of symptoms -Ensure follow up with gastroenterology   *AKI -Monitor for resolution at follow up  *Chronic opioid use -Patient may need referral to pain clinic or re-visit pain contract at [facility]  *Migraine  -Patient was previously treated -Monitor for migraine frequency -Consider prophylactic therapy at follow up  *HFrEF *HTN -Monitor for evidence of volume overload -Ensure patient has restarted her GDMT   2.  Labs / imaging needed at time of follow-up: BMP  3.  Pending labs/ test needing follow-up: none  4.  Medication Changes   ADDED  -Sumatriptan, Senna S, Compazine   MODIFIED  - Torsemide as needed for volume overload  - Bentyl 10 mg TID PRN   Follow-up Appointments: Patient is to call [facility] for follow up appointment in the next week for discharge follow up Patient has been scheduled for follow up at [facility] for IBS follow up  Hospital Course by problem list:  Abdominal pain Constipation IBS Pt presented with periumbilical abdominal pain radiating  to RLQ, N/V, and mucous-y stools. Concern for appendicitis, and CT AP with contrast showed mildly enlarged appendix without surrounding inflammation. Repeat CT with oral and IV contrast showed normal appendix with prominent stool throughout colon. Constipation thought to be source of pt's abdominal pain and N/V, likely in the setting of chronic opioid use and anticholinergic medications. She was started on bowel regiment of Miralax, senna with subsequent BM and improvement in symptoms. Encouraged good bowel habits and continued bowel regimen at discharge. Patient also called after hours gasteroenteroly line, and they also approved increasing her dose of bentyl to 20 mg TID prn, and she was prescribed a 30 day course of this at discharge.  Acute kidney injury Likely in the setting of GI losses, which continued to improve with initial fluid resuscitation and subsequent po intake after treatment with  antiemetics. Cr at discharge was 1.02. Patient was restarted on GDMT for HF and was instructed to take Torsemide as needed until re-evaluation in clinic.  She was discharged with a short course of compazine to ensure PO intake as she transitions home.  Chronic opioid use Recently having difficulties with accessing medications in the outpatient setting after losing her 6/27 dispensed bottle. Patient knows to follow up with [facility] where she currently has a pain contract. Her abdominal pain improved significantly with treatment for constipation. She will need to re-engage in pain contract parameters in the clinic at follow up or, if needed, referral to a pain specialist for continued care.  Migraines History of this in the past, treated inpatient with sumatriptan x2 with resolution. Patient was prescribed a short course of sumatriptan for abortive therapy. No red flag symptoms on our evaluation today. Patient will need follow up to assess frequency of migraines and, if needed, prophylactic therapy in the outpatient  setting.  Discharge Subjective: Feeling much better this AM. Was able to tolerate food intake without nausea or vomiting. Had a large BM at night and in the AM after receiving sorbitol and Senna S. Has been able to mobilize in the room. Abdominal pain has improved. Denies current headache and wishes to go home.  Discharge Exam:   Blood pressure 127/85, pulse 90, temperature 99.1 F (37.3 C), temperature source Oral, resp. rate 19, height 5\' 10"  (1.778 m), weight (!) 148.1 kg, SpO2 93%.  Constitutional:well-appearing woman sitting in bed, in no acute distress HENT: normocephalic atraumatic, mucous membranes moist Cardiovascular: regular rate and rhythm, no m/r/g, no JVD Pulmonary/Chest: normal work of breathing on room air, lungs clear to auscultation bilaterally. No crackles  Abdominal: soft, non-tender, non-distended. No fluid wave  Neurological: alert & oriented x 3 MSK: no gross abnormalities. No pitting edema Skin: warm and dry Psych: Normal mood and affect  Pertinent Labs, Studies, and Procedures:     Latest Ref Rng & Units 08/11/2022    2:23 AM 08/10/2022    8:20 AM 08/09/2022    8:24 PM  CBC  WBC 4.0 - 10.5 K/uL 5.8  8.8  7.7   Hemoglobin 12.0 - 15.0 g/dL 9.3  40.9  81.1   Hematocrit 36.0 - 46.0 % 29.6  35.8  32.6   Platelets 150 - 400 K/uL 343  367  375        Latest Ref Rng & Units 08/11/2022    2:23 AM 08/09/2022    8:24 PM 08/07/2022   10:24 AM  CMP  Glucose 70 - 99 mg/dL 914  99  93   BUN 6 - 20 mg/dL 6  9  12    Creatinine 0.44 - 1.00 mg/dL 7.82  9.56  2.13   Sodium 135 - 145 mmol/L 139  138  142   Potassium 3.5 - 5.1 mmol/L 3.9  3.4  4.0   Chloride 98 - 111 mmol/L 109  106  105   CO2 22 - 32 mmol/L 23  24  21    Calcium 8.9 - 10.3 mg/dL 8.3  8.7  9.5   Total Protein 6.5 - 8.1 g/dL 5.7  6.4  6.7   Total Bilirubin 0.3 - 1.2 mg/dL 0.2  0.3  <0.8   Alkaline Phos 38 - 126 U/L 52  58  77   AST 15 - 41 U/L 9  9  9    ALT 0 - 44 U/L 10  10  8      CT ABDOMEN  PELVIS W  CONTRAST  Result Date: 08/10/2022 CLINICAL DATA:  Right lower quadrant abdominal pain EXAM: CT ABDOMEN AND PELVIS WITH CONTRAST TECHNIQUE: Multidetector CT imaging of the abdomen and pelvis was performed using the standard protocol following bolus administration of intravenous contrast. RADIATION DOSE REDUCTION: This exam was performed according to the departmental dose-optimization program which includes automated exposure control, adjustment of the mA and/or kV according to patient size and/or use of iterative reconstruction technique. CONTRAST:  OMNIPAQUE IOHEXOL 350 MG/ML SOLN COMPARISON:  Same day 08/10/2022 CT scan performed at 3:41 a.m. FINDINGS: Lower chest: Unremarkable Hepatobiliary: Unremarkable Pancreas: Unremarkable Spleen: Unremarkable Adrenals/Urinary Tract: Unremarkable Stomach/Bowel: Prominent stool throughout the colon favors constipation. Normal appendix. No dilated small bowel. Vascular/Lymphatic: Mild aortoiliac atherosclerotic vascular calcification. Reproductive: Suspected 2.1 cm right uterine fibroid, image 69 series 3. Other: No supplemental non-categorized findings. Musculoskeletal: Unremarkable IMPRESSION: 1. Prominent stool throughout the colon favors constipation. 2. Normal appendix. 3. Suspected 2.1 cm right uterine fibroid. 4. Aortic atherosclerosis. Aortic Atherosclerosis (ICD10-I70.0). Electronically Signed   By: Gaylyn Rong M.D.   On: 08/10/2022 18:50   CT ABDOMEN PELVIS W CONTRAST  Result Date: 08/10/2022 CLINICAL DATA:  52 year old female with 24 hours of abdominal pain. EXAM: CT ABDOMEN AND PELVIS WITH CONTRAST TECHNIQUE: Multidetector CT imaging of the abdomen and pelvis was performed using the standard protocol following bolus administration of intravenous contrast. RADIATION DOSE REDUCTION: This exam was performed according to the departmental dose-optimization program which includes automated exposure control, adjustment of the mA and/or kV according to  patient size and/or use of iterative reconstruction technique. CONTRAST:  75mL OMNIPAQUE IOHEXOL 350 MG/ML SOLN COMPARISON:  CTA abdomen and Pelvis 05/16/2022 and earlier. FINDINGS: Lower chest: Negative.  No pericardial or pleural effusion. Hepatobiliary: Negative liver and gallbladder. No bile duct enlargement. Pancreas: Negative. Spleen: Negative. Adrenals/Urinary Tract: Negative. No nephrolithiasis or urinary calculus identified. Pelvic phleboliths. Decompressed ureters and bladder. Stomach/Bowel: Negative large bowel aside from retained stool, occasional sigmoid diverticula. Appendix located in the midline (series 3, image 62 and coronal image 112). Gas in the midportion of the appendix. The distal appendix lumen is opacified, and the diameter is borderline to mildly enlarged 7-8 mm. But there is no adjacent fat stranding. Negative terminal ileum. No dilated small bowel small volume food debris in the stomach. Duodenum appears negative. No free air, free fluid, or mesenteric inflammation identified. Vascular/Lymphatic: Mostly iliac artery calcified atherosclerosis in the abdomen and pelvis. Major arterial structures are patent. Portal venous system appears to be patent. Normal caliber abdominal aorta. No lymphadenopathy. Reproductive: Evidence of a small right fundal uterine fibroid, 2.3 cm on series 3, image 68. This has been present since at least 2021. Ovaries are within normal limits. Other: No pelvis free fluid. Musculoskeletal: Lower lumbar vacuum disc and vacuum facet degeneration. No acute osseous abnormality identified. IMPRESSION: 1. Borderline to mildly enlarged Appendix, but no surrounding inflammation to suggest acute appendicitis. If the clinical course is equivocal repeat CT Abdomen and Pelvis in 12-24 hours with both oral and IV contrast would be recommended. 2. No other acute or inflammatory process identified in the abdomen or pelvis. Small chronic uterine fibroid. Electronically Signed   By:  Odessa Fleming M.D.   On: 08/10/2022 04:27     Discharge Instructions: Discharge Instructions     (HEART FAILURE PATIENTS) Call Lindsay [name]:  Anytime you have any of the following symptoms: 1) 3 pound weight gain in 24 hours or 5 pounds in 1 week 2) shortness of breath, with or  without a dry hacking cough 3) swelling in the hands, feet or stomach 4) if you have to sleep on extra pillows at night in order to breathe.   Complete by: As directed    Call Lindsay [name] for:  difficulty breathing, headache or visual disturbances   Complete by: As directed    Call Lindsay [name] for:  extreme fatigue   Complete by: As directed    Call Lindsay [name] for:  persistant dizziness or light-headedness   Complete by: As directed    Call Lindsay [name] for:  persistant nausea and vomiting   Complete by: As directed    Call Lindsay [name] for:  redness, tenderness, or signs of infection (pain, swelling, redness, odor or green/yellow discharge around incision site)   Complete by: As directed    Call Lindsay [name] for:  severe uncontrolled pain   Complete by: As directed    Diet - low sodium heart healthy   Complete by: As directed    Discharge instructions   Complete by: As directed    Lindsay [name],  You were admitted to the hospital because your progressive abdominal pain. We evaluated you with two abdominal scans as we were concerned about potential appendix inflammation. However, your second scan showed severe constipation, which happens when there is a lot of stool in your gut that has not been able to be cleared. We suspect this is because the pain medications and the muscle relaxants that you use.   Initially, we saw that your kidney function had declined, which happens when people don't drink enough water. In your case, we managed your nausea and continued giving you fluids through your vein. It is reassuring that it is going back to normal. For now, hold your water pill (torsemide) until Tuesday. You can start taking it if you see worsening edema. Otherwise, resume your other  blood pressure medications.  You are now stable and can be managed outpatient. We need you to increase your bowel regimen to make sure you have at least one bowel movement per day. We also recommend that you follow with your gastroenterologist in the following weeks.  For your migraines, we are are prescribing you with a short course of sumatriptan.Take 1 tablet (50 mg total) by mouth every 2 (two) hours as needed for migraine. May repeat in 2 hours if headache persists or recurs. Do not take more than 2 doses in one day. But this will be best discussed in the outpatient setting.  We will be prescribing you with a short course of compazine as this helped with your nausea in the hospital. You can take one tablet every 8 hours as needed for nausea.  Please call to make an appointment at the [facility] with Dr. Antony Contras in the next week or so to follow up on your chronic pain management. The phone number is [phone].  Take care, Lindsay Crocker, Lindsay [name]   Increase activity slowly   Complete by: As directed        Signed: Morene Crocker, Lindsay [name] [facility] - PGY1 Pager: [phone] 08/11/2022, 9:47 AM    Please contact the on call pager after 5 pm and on weekends at [phone].

## 2022-08-11 NOTE — TOC Initial Note (Signed)
Transition of Care [facility]) - Initial/Assessment Note    Patient Details  Name: Lindsay [name] MRN: [id number] Date of Birth: [birth year]  Transition of Care [facility]) CM/SW Contact:    Ronny Bacon, RN Phone Number: 08/11/2022, 7:43 AM  Clinical Narrative:   Spoke with patient at bedside. Patient requesting assistance with transportation needs to doctor appointments and food banks. Discussed applying for medicaid transportation to see if she would be eligible. Discussed meals on wheels and using area food banks for food resources. Discussed county transportation, [employer], to see if she would be eligible for their services.                Expected Discharge Plan: Home/Self Care Barriers to Discharge: Continued Medical Work up   Patient Goals and CMS Choice Patient states their goals for this hospitalization and ongoing recovery are:: To get assistance with transportation to doctor appointments and food resources          Expected Discharge Plan and Services       Living arrangements for the past 2 months: Single Family Home                                      Prior Living Arrangements/Services Living arrangements for the past 2 months: Single Family Home   Patient language and need for interpreter reviewed:: Yes Do you feel safe going back to the place where you live?: Yes      Need for Family Participation in Patient Care: No (Comment) Care giver support system in place?: Yes (comment)   Criminal Activity/Legal Involvement Pertinent to Current Situation/Hospitalization: No - Comment as needed  Activities of Daily Living   ADL Screening (condition at time of admission) Patient's cognitive ability adequate to safely complete daily activities?: Yes Is the patient deaf or have difficulty hearing?: No Does the patient have difficulty seeing, even when wearing glasses/contacts?: No Patient able to express need for assistance with ADLs?: Yes Does the patient have  difficulty dressing or bathing?: Yes Independently performs ADLs?: Yes (appropriate for developmental age) Does the patient have difficulty walking or climbing stairs?: Yes Weakness of Legs: Both Weakness of Arms/Hands: Both  Permission Sought/Granted                  Emotional Assessment Appearance:: Appears stated age Attitude/Demeanor/Rapport: Engaged Affect (typically observed): Appropriate Orientation: : Oriented to Self, Oriented to Place, Oriented to  Time, Oriented to Situation Alcohol / Substance Use: Not Applicable Psych Involvement: No (comment)  Admission diagnosis:  Dehydration [E86.0] Generalized abdominal pain [R10.84] Abdominal pain [R10.9] Patient Active Problem List   Diagnosis Date Noted   Abdominal pain 08/10/2022   Elevated serum creatinine 08/10/2022   Gastroenteritis 08/07/2022   Alopecia 08/07/2022   Facial droop 05/22/2022   Lip swelling 05/22/2022   Fibroid, uterine 05/02/2022   Scoliosis 03/27/2022   Weakness 03/27/2022   Thoracic back pain 03/18/2022   Acute left flank pain 03/15/2022   Post-nasal drip 01/17/2022   Nexplanon in place 01/09/2022   COPD (chronic obstructive pulmonary disease) (HCC) 12/27/2021   Tremor 12/27/2021   Pharyngitis 09/12/2021   History of torn meniscus of left knee 05/23/2021   History of CVA (cerebrovascular accident) 05/23/2021   Amenorrhea 11/08/2020   Memory problem 11/08/2020   Onychomycosis 11/08/2020   Hypoactive sexual desire disorder due to medical condition in female 10/16/2020   Diabetes (HCC) 02/03/2020  HLD (hyperlipidemia) 02/03/2020   Dizziness 02/02/2020   Pain in both knees 12/09/2019   Chronic heart failure (HCC) 11/25/2019   OSA (obstructive sleep apnea) 06/14/2019   Right shoulder pain 04/21/2019   Gum lesion 03/15/2019   Compression fracture of body of thoracic vertebra (HCC) 03/08/2019   Iron deficiency 02/10/2019   History of colon polyps    Chronic, continuous use of opioids  12/05/2017   Chronic respiratory failure with hypoxia (HCC)    Vitamin B12 deficiency 05/02/2017   Palpitations 04/11/2017   Vitamin D deficiency, unspecified 06/12/2016   Nicotine dependence 06/12/2016   Menorrhagia with irregular cycle 01/08/2016   Migraines 01/08/2016   Leg pain, bilateral 11/29/2015   Fibromyalgia 07/05/2015   Acute pain of left knee 09/26/2014   Healthcare maintenance 09/26/2014   Morbid obesity (HCC) 07/04/2014   Pelvic pain 03/25/2014   Major depressive disorder, recurrent episode, moderate (HCC) 10/13/2012   Generalized anxiety disorder 10/13/2012   Chronic nausea 05/10/2010   IBS (irritable bowel syndrome) 06/16/2007   Essential hypertension 02/06/2006   Hidradenitis 11/18/2005   PCP:  [name], MD Pharmacy:   [facility] [phone] [place] Otto, [place] - [address] AT [facility] [address] [place] [address] Phone: [phone] Fax: [phone]     Social Determinants of Health (SDOH) Social History: SDOH Screenings   Food Insecurity: Food Insecurity Present (08/10/2022)  Housing: Low Risk  (08/10/2022)  Transportation Needs: Unmet Transportation Needs (08/10/2022)  Utilities: Not At Risk (08/10/2022)  Alcohol Screen: Low Risk  (08/14/2021)  Depression (PHQ2-9): High Risk (08/07/2022)  Financial Resource Strain: High Risk (03/18/2022)  Physical Activity: Insufficiently Active (08/14/2021)  Social Connections: Moderately Isolated (03/18/2022)  Stress: No Stress Concern Present (08/14/2021)  Tobacco Use: Medium Risk (08/09/2022)   SDOH Interventions:     Readmission Risk Interventions    11/29/2019   12:57 PM  Readmission Risk Prevention Plan  Transportation Screening Complete  PCP or Specialist Appt within 5-7 Days Complete  Home Care Screening Complete  Medication Review (RN CM) Complete

## 2022-08-11 NOTE — Care Management Obs Status (Signed)
MEDICARE OBSERVATION STATUS NOTIFICATION   Patient Details  Name: Lindsay [name] MRN: [id number] Date of Birth: 10/15/1970   Medicare Observation Status Notification Given:  Yes    Ronny Bacon, RN 08/11/2022, 7:24 AM

## 2022-08-11 NOTE — Progress Notes (Signed)
Brief Progress note     Paged by nursing due to concerns that Ms Lindsay [name] is complaining of a headache that she rates 10/10, says is similar to a headache she has had in the past.  Patient said the pain is throbbing,non radiating and is more severe and localized around left eye.  Denies any nausea , numbness or chills at this time.  On exam the pain is exacerbated by shining light to the eye.  Patient reports Tylenol has not been effective in the past for similar headaches. Hx of migraines. Considering migraine at this time based on the patient's symptoms. Patient is currently on Tylenol and Compazine, will give an initial 100 mg dose of sumatriptan and reassess the patient's headache in an hour.   Kathleen Lime MD Internal Medicine Resident PGY-1 Pager: [phone] Please contact the on call pager after 5 pm and on weekends at [phone].

## 2022-08-12 ENCOUNTER — Telehealth: Payer: Self-pay

## 2022-08-12 NOTE — Transitions of Care (Post Inpatient/ED Visit) (Signed)
   08/12/2022  Name: Lindsay [name] MRN: [id number] DOB: 1970/03/18  Today's TOC FU Call Status: Today's TOC FU Call Status:: Unsuccessul Call (1st Attempt) Unsuccessful Call (1st Attempt) Date: 08/12/22  Attempted to reach the patient regarding the most recent Inpatient/ED visit.  Follow Up Plan: Additional outreach attempts will be made to reach the patient to complete the Transitions of Care (Post Inpatient/ED visit) call.   Signature Karena Addison, LPN [facility] Nurse Health Advisor Direct Dial [phone]

## 2022-08-13 ENCOUNTER — Encounter: Payer: Self-pay | Admitting: Internal Medicine

## 2022-08-13 NOTE — Transitions of Care (Post Inpatient/ED Visit) (Signed)
   08/13/2022  Name: Lindsay [name] MRN: [id number] DOB: 1970/09/14  Today's TOC FU Call Status: Today's TOC FU Call Status:: Unsuccessful Call (2nd Attempt) Unsuccessful Call (1st Attempt) Date: 08/12/22 Unsuccessful Call (2nd Attempt) Date: 08/13/22  Attempted to reach the patient regarding the most recent Inpatient/ED visit.  Follow Up Plan: Additional outreach attempts will be made to reach the patient to complete the Transitions of Care (Post Inpatient/ED visit) call.   Signature Karena Addison, LPN [facility] Nurse Health Advisor Direct Dial [phone]

## 2022-08-14 NOTE — Telephone Encounter (Signed)
Patient calling back regarding MyChart message sent yesterday. Continues with h/a behind both eyes and across forehead; rates 9/10 at present. She took last dose of Imitrex yesterday at 58. States she took one tab every 4-8 hours instead of q 2 hours but is still out. Requesting referral to "headache clinic." Also, requesting refill on compazine since she was told to stop zofran at hosp d/c. Compazine expired off med list on 7/16. Also, still having abd pain at umbilicus radiating to RLQ. States she is having 1-2 BMs daily so does not think this is constipation. She has already been given first available appt with PCP for 8/7.

## 2022-08-15 MED ORDER — SUMATRIPTAN SUCCINATE 50 MG PO TABS: 50.0000 mg | ORAL_TABLET | ORAL | 0 refills | Status: DC | PRN

## 2022-08-15 MED ORDER — PROCHLORPERAZINE MALEATE 5 MG PO TABS: 5.0000 mg | ORAL_TABLET | Freq: Three times a day (TID) | ORAL | 0 refills | Status: DC | PRN

## 2022-08-15 NOTE — Transitions of Care (Post Inpatient/ED Visit) (Signed)
08/15/2022  Name: Lindsay [name] MRN: [id number] DOB: 1970-02-17  Today's TOC FU Call Status: Today's TOC FU Call Status:: Successful TOC FU Call Competed Unsuccessful Call (1st Attempt) Date: 08/12/22 Unsuccessful Call (2nd Attempt) Date: 08/13/22 [facility] FU Call Complete Date: 08/15/22  Transition Care Management Follow-up Telephone Call Date of Discharge: 08/11/22 Discharge Facility: [facility]) Type of Discharge: Inpatient Admission Primary Inpatient Discharge Diagnosis:: abd pain How have you been since you were released from the hospital?: Better Any questions or concerns?: No  Items Reviewed: Did you receive and understand the discharge instructions provided?: No Medications obtained,verified, and reconciled?: Yes (Medications Reviewed) Any new allergies since your discharge?: No Dietary orders reviewed?: Yes Do you have support at home?: Yes Name of Support/Comfort Primary Source: aid  Medications Reviewed Today: Medications Reviewed Today     Reviewed by Karena Addison, LPN (Licensed Practical Nurse) on 08/15/22 at 1509  Med List Status: <None>   Medication Order Taking? Sig Documenting Provider Last Dose Status Informant  ACCU-CHEK GUIDE test strip [id number] No USE ONE TEST STRIP TO CHECK BLOOD SUGARS ONCE A DAY AS NEEDED [name], MD Taking Active Self, Pharmacy Records  Accu-Chek Softclix Lancets lancets [id number]  USE ONE LANCET TO CHECK BLOOD SUGAR ONE A DAY AS NEEDED [name], MD  Active Self, Pharmacy Records  albuterol (PROVENTIL) (2.5 MG/3ML) 0.083% nebulizer solution [id number] No TAKE 3 ML (2.5 MG TOTAL) BY NEBULIZATION EVERY 4 HOURS AS NEEDED FOR WHEEZING OR SHORTNESS OF BREATH Olalere, Adewale A, MD Past Month Active Self, Pharmacy Records  albuterol (VENTOLIN HFA) 108 (90 Base) MCG/ACT inhaler [id number] No TAKE 2 PUFFS BY MOUTH EVERY 6 HOURS AS NEEDED FOR WHEEZE OR SHORTNESS OF BREATH Olalere, Adewale A, MD Past Week Active Self, Pharmacy  Records  atorvastatin (LIPITOR) 40 MG tablet [id number] No TAKE 1 TABLET BY MOUTH EVERY Jannet Mantis, MD 08/09/2022 Active Self, Pharmacy Records  azelastine (ASTELIN) 0.1 % nasal spray [id number] No Place 1 spray into both nostrils 2 (two) times daily. Use in each nostril as directed Marrianne Mood, MD Past Month Active Self, Pharmacy Records  Blood Glucose Monitoring Suppl (ACCU-CHEK GUIDE ME) w/Device KIT [id number] No Dispense one device [name], MD Taking Active Self, Pharmacy Records  Brexpiprazole (REXULTI) 3 MG TABS [id number] No Take 1 tablet (3 mg total) by mouth daily. Cleotis Nipper, MD 08/09/2022 Active Self, Pharmacy Records  cetirizine (ZYRTEC) 10 MG tablet [id number] No Take 1 tablet (10 mg total) by mouth daily. [name], MD Past Week Active Self, Pharmacy Records  clindamycin (CLINDAGEL) 1 % gel [id number] No APPLY TO AFFECTED AREA TWICE A DAY  Patient taking differently: Apply 1 Application topically 2 (two) times daily. APPLY TO AFFECTED AREA TWICE A DAY   Tyson Alias, MD Past Month Active Self, Pharmacy Records  [facility] D3 25 MCG (1000 UT) capsule [id number] No TAKE 1 CAPSULE BY MOUTH EVERY Jannet Mantis, MD 08/09/2022 Active Self, Pharmacy Records  diclofenac (VOLTAREN) 75 MG EC tablet [id number] No Take 75 mg by mouth 2 (two) times daily. [provider] Past Week Active Self, Pharmacy Records  dicyclomine (BENTYL) 10 MG capsule [id number]  Take 2 capsules (20 mg total) by mouth 3 (three) times daily as needed for spasms (abdominal pain). Morene Crocker, MD  Active   doxepin (SINEQUAN) 25 MG capsule [id number] No Take 1 capsule (25 mg total) by mouth at bedtime. Cleotis Nipper, MD Past Week Active Self, Pharmacy Records  ENTRESTO [address] [place] [id number] No  Take 1 tablet by mouth 2 (two) times daily. Mercie Eon, MD 08/09/2022 Active Self, Pharmacy Records  famotidine (PEPCID) 20 MG tablet [id number] No Take 1 tablet (20 mg total) by  mouth daily. [name], MD 08/09/2022 Active Self, Pharmacy Records  ferrous sulfate 325 (65 FE) MG EC tablet [id number] No Take 1 tablet (325 mg total) by mouth daily with breakfast.  Patient taking differently: Take 325 mg by mouth every other day.   [name], MD Past Week Expired 08/10/22 2359 Self, Pharmacy Records  fluticasone (FLONASE) 50 MCG/ACT nasal spray [id number] No INSTILL 1 SPRAY INTO BOTH NOSTRILS DAILY  Patient taking differently: Place 1 spray into both nostrils daily.   [name], MD Past Month Active Self, Pharmacy Records  ipratropium (ATROVENT) 0.06 % nasal spray [id number] No PLEASE SEE ATTACHED FOR DETAILED DIRECTIONS [provider] Past Week Active Self, Pharmacy Records  lamoTRIgine (LAMICTAL) 150 MG tablet [id number] No Take 1 tablet (150 mg total) by mouth daily. Cleotis Nipper, MD 08/09/2022 Active Self, Pharmacy Records  meclizine (ANTIVERT) 25 MG tablet [id number] No Take 1 tablet (25 mg total) by mouth 3 (three) times daily as needed for dizziness. [name], MD 08/09/2022 Active Self, Pharmacy Records  megestrol (MEGACE) 40 MG tablet [id number] No Take 2 tablets (80 mg total) by mouth daily. Reva Bores, MD 08/09/2022 Active Self, Pharmacy Records  meloxicam [facility]) 7.5 MG tablet [id number] No Take 1-2 tablets (7.5-15 mg) by mouth daily as needed. [name], MD unknown Active Self, Pharmacy Records  metoprolol succinate (TOPROL-XL) 50 MG 24 hr tablet [id number] No Take 1 tablet (50 mg total) by mouth daily. [name], MD 08/09/2022 Active Self, Pharmacy Records  omeprazole (PRILOSEC) 40 MG capsule [id number] No Take 40 mg by mouth daily. [provider] Past Week Active Self, Pharmacy Records  polyethylene glycol (MIRALAX / GLYCOLAX) 17 g packet [id number]  Take 17 g by mouth daily. Morene Crocker, MD  Active   prochlorperazine (COMPAZINE) 5 MG tablet [id number]  Take 1 tablet (5 mg total) by mouth every 8  (eight) hours as needed for up to 2 days for nausea or vomiting. [name], MD  Active   Semaglutide, 2 MG/DOSE, (OZEMPIC, 2 MG/DOSE,) 8 MG/3ML SOPN [id number] No Inject 2 mg into the skin once a week. [name], MD Past Week Active Self, Pharmacy Records  senna (SENOKOT) 8.6 MG TABS tablet [id number]  Take 2 tablets (17.2 mg total) by mouth 2 (two) times daily. Morene Crocker, MD  Active   SUMAtriptan (IMITREX) 50 MG tablet [id number]  Take 1 tablet (50 mg total) by mouth every 2 (two) hours as needed for up to 10 doses for migraine. May repeat in 2 hours if headache persists or recurs. [name], MD  Active   tiZANidine (ZANAFLEX) 4 MG tablet [id number] No TAKE 1 TABLET (4 MG TOTAL) BY MOUTH EVERY 8 (EIGHT) HOURS AS NEEDED FOR MUSCLE SPASMS [name], MD 08/09/2022 Active Self, Pharmacy Records  torsemide (DEMADEX) 20 MG tablet [id number] No Take 2 tablets (40 mg total) by mouth 2 (two) times daily. [name], MD 08/09/2022 Active Self, Pharmacy Records            Home Care and Equipment/Supplies: Were Home Health Services Ordered?: NA Any new equipment or medical supplies ordered?: NA  Functional Questionnaire: Do you need assistance with bathing/showering or dressing?: No Do you need assistance with meal preparation?: No Do you need assistance with eating?: No Do you have difficulty maintaining continence: No  Do you need assistance with getting out of bed/getting out of a chair/moving?: No Do you have difficulty managing or taking your medications?: No  Follow up appointments reviewed: PCP Follow-up appointment confirmed?: Yes Date of PCP follow-up appointment?: 09/04/22 Follow-up Provider: [facility] Follow-up appointment confirmed?: NA Do you need transportation to your follow-up appointment?: No Do you understand care options if your condition(s) worsen?: Yes-patient verbalized understanding    SIGNATURE Karena Addison, LPN [facility] Nurse Health Advisor Direct Dial [phone]

## 2022-08-15 NOTE — Telephone Encounter (Signed)
Patient calling again regarding abdominal pain. No openings today in clinic; one opening tomorrow at 0915, but patient does not have transportation. She would like a call back from PCP to discuss findings during recent admission and what steps to take next for continued pain.

## 2022-08-17 ENCOUNTER — Telehealth: Payer: Self-pay | Admitting: Student

## 2022-08-17 NOTE — Telephone Encounter (Signed)
Replied to a request for the call back for this patient to has headache and abdominal pain.  Both symptoms have been ongoing for the last week or so since she was admitted to the hospital recently.  Describes a severe headache behind the eyes, not associated with vision changes or unilateral weakness or numbness/tingling.  No fainting or loss consciousness.  Abdominal pain is severe, same abdominal pain that she had on discharge.  Both headache and abdominal pain have worsened over the last 24 hours despite treatment with sumatriptan, Bentyl and prochlorperazine.  She sounds somewhat ill over the phone.  She checks her heart rate and oxygen levels with a home monitor and reports values within normal limits.  She has an appointment with her PCP on August 7 but does not think she can make it until then.  I recommended that she go seek care at an urgent care clinic or the emergency department for an in person evaluation to guide management decisions.

## 2022-08-19 ENCOUNTER — Encounter (HOSPITAL_COMMUNITY): Payer: Self-pay

## 2022-08-19 ENCOUNTER — Other Ambulatory Visit: Payer: Self-pay

## 2022-08-19 ENCOUNTER — Emergency Department (HOSPITAL_COMMUNITY)
Admission: EM | Admit: 2022-08-19 | Discharge: 2022-08-19 | Disposition: A | Discharge status: Home / Self Care | Payer: 59 | Attending: Emergency Medicine | Admitting: Emergency Medicine

## 2022-08-19 DIAGNOSIS — I130 Hypertensive heart and chronic kidney disease with heart failure and stage 1 through stage 4 chronic kidney disease, or unspecified chronic kidney disease: Secondary | ICD-10-CM | POA: Insufficient documentation

## 2022-08-19 DIAGNOSIS — Z8673 Personal history of transient ischemic attack (TIA), and cerebral infarction without residual deficits: Secondary | ICD-10-CM | POA: Insufficient documentation

## 2022-08-19 DIAGNOSIS — N189 Chronic kidney disease, unspecified: Secondary | ICD-10-CM | POA: Insufficient documentation

## 2022-08-19 DIAGNOSIS — R109 Unspecified abdominal pain: Secondary | ICD-10-CM | POA: Diagnosis present

## 2022-08-19 DIAGNOSIS — R1084 Generalized abdominal pain: Secondary | ICD-10-CM | POA: Insufficient documentation

## 2022-08-19 DIAGNOSIS — R519 Headache, unspecified: Secondary | ICD-10-CM | POA: Diagnosis not present

## 2022-08-19 DIAGNOSIS — D649 Anemia, unspecified: Secondary | ICD-10-CM | POA: Diagnosis not present

## 2022-08-19 DIAGNOSIS — I509 Heart failure, unspecified: Secondary | ICD-10-CM | POA: Insufficient documentation

## 2022-08-19 LAB — COMPREHENSIVE METABOLIC PANEL
ALT: 10 U/L (ref 0–44)
AST: 12 U/L — ABNORMAL LOW (ref 15–41)
Albumin: 3.5 g/dL (ref 3.5–5.0)
Alkaline Phosphatase: 67 U/L (ref 38–126)
Anion gap: 8 (ref 5–15)
BUN: 7 mg/dL (ref 6–20)
CO2: 22 mmol/L (ref 22–32)
Calcium: 8.6 mg/dL — ABNORMAL LOW (ref 8.9–10.3)
Chloride: 108 mmol/L (ref 98–111)
Creatinine, Ser: 1.01 mg/dL — ABNORMAL HIGH (ref 0.44–1.00)
GFR, Estimated: >60 mL/min (ref >60)
Glucose, Bld: 87 mg/dL (ref 70–99)
Potassium: 4.1 mmol/L (ref 3.5–5.1)
Sodium: 138 mmol/L (ref 135–145)
Total Bilirubin: 0.2 mg/dL — ABNORMAL LOW (ref 0.3–1.2)
Total Protein: 6.7 g/dL (ref 6.5–8.1)

## 2022-08-19 LAB — URINALYSIS, ROUTINE W REFLEX MICROSCOPIC
Bilirubin Urine: NEGATIVE
Glucose, UA: NEGATIVE mg/dL
Hgb urine dipstick: NEGATIVE
Ketones, ur: NEGATIVE mg/dL
Leukocytes,Ua: NEGATIVE
Nitrite: NEGATIVE
Protein, ur: 30 mg/dL — AB
Specific Gravity, Urine: 1.023 (ref 1.005–1.030)
pH: 5 (ref 5.0–8.0)

## 2022-08-19 LAB — HCG, SERUM, QUALITATIVE: Preg, Serum: NEGATIVE

## 2022-08-19 LAB — CBC
HCT: 34.8 % — ABNORMAL LOW (ref 36.0–46.0)
Hemoglobin: 10.6 g/dL — ABNORMAL LOW (ref 12.0–15.0)
MCH: 26.2 pg (ref 26.0–34.0)
MCHC: 30.5 g/dL (ref 30.0–36.0)
MCV: 86.1 fL (ref 80.0–100.0)
Platelets: 484 10*3/uL — ABNORMAL HIGH (ref 150–400)
RBC: 4.04 MIL/uL (ref 3.87–5.11)
RDW: 16.1 % — ABNORMAL HIGH (ref 11.5–15.5)
WBC: 8.3 10*3/uL (ref 4.0–10.5)
nRBC: 0 % (ref 0.0–0.2)

## 2022-08-19 LAB — LIPASE, BLOOD: Lipase: 25 U/L (ref 11–51)

## 2022-08-19 LAB — TROPONIN I (HIGH SENSITIVITY)
Troponin I (High Sensitivity): 3 ng/L (ref <18)
Troponin I (High Sensitivity): 3 ng/L (ref <18)

## 2022-08-19 MED ORDER — SODIUM CHLORIDE 0.9 % IV BOLUS: 1000.0000 mL | Freq: Once | INTRAVENOUS | Status: DC

## 2022-08-19 MED ORDER — DROPERIDOL 2.5 MG/ML IJ SOLN
1.2500 mg | Freq: Once | INTRAMUSCULAR | Status: DC
  Filled 2022-08-19: qty 2

## 2022-08-19 MED ORDER — KETOROLAC TROMETHAMINE 30 MG/ML IJ SOLN
30.0000 mg | Freq: Once | INTRAMUSCULAR | Status: AC
  Administered 2022-08-19: 30 mg via INTRAMUSCULAR
  Filled 2022-08-19: qty 1

## 2022-08-19 MED ORDER — DROPERIDOL 2.5 MG/ML IJ SOLN
1.2500 mg | Freq: Once | INTRAMUSCULAR | Status: AC
  Administered 2022-08-19: 1.25 mg via INTRAMUSCULAR

## 2022-08-19 NOTE — ED Triage Notes (Addendum)
Pt c/o abd pain at umbilicus and HA; evaluated 7/12 for same; tried to make follow up appointment but could not get in until next week, was told to come to ED; endorses fevers, taking tylenol at home, last dose 0745; denies urinary issues, endorses nausea and vomiting; endorses tachycardia at home 112; denies cp, denies sob; hx copd, wears 3L o2 at baseline but states she forgot it at home

## 2022-08-19 NOTE — Discharge Instructions (Signed)

## 2022-08-19 NOTE — ED Notes (Signed)
This RN attempted IV access without success, another Rn to try

## 2022-08-19 NOTE — ED Provider Notes (Signed)
Emergency Department Provider Note   I have reviewed the triage vital signs and the nursing notes.   HISTORY  Chief Complaint Abdominal Pain   HPI Lindsay [name] is a 52 y.o. female with past history reviewed below including IBS, fibromyalgia, known fibroid presents to the emergency department with continued abdominal pain.  She has been taking her laxatives and is having 2-3 soft bowel movements per day but pain persists.  She reports temp at home in the 99 Fahrenheit range but nothing over 100.4 F.  She states that she was in the hospital recently with CT scans on the 13th and developed a headache shortly after drinking the oral contrast.  The headache has persisted.  She does have history of migraines which have felt similar in the past. No numbness/weakness. No vision change other than photophobia.   Past Medical History:  Diagnosis Date   Acid reflux    Anemia    Iron Def   Anorexia    CHF (congestive heart failure) (HCC)    Chronic kidney disease    Nephrotic syndrome   Colon polyp 2009   Depression with anxiety    Edema leg    Fibroid tumor 04/2022   Fibromyalgia    Hemorrhoids    Hidradenitis suppurativa    Hypertension    IBS (irritable bowel syndrome)    Low back pain    Migraines    Morbidly obese (HCC)    Neuromuscular disorder (HCC)    fibromyalgia   Neuropathy    Panic attacks    Polyp of vocal cord or larynx    Stroke (HCC)    Tonsil pain     Review of Systems  Constitutional: No fever/chills Cardiovascular: Denies chest pain. Respiratory: Denies shortness of breath. Gastrointestinal: Positive diffuse abdominal pain.  No nausea, no vomiting.  No diarrhea.  No constipation. Genitourinary: Negative for dysuria. Musculoskeletal: Negative for back pain. Skin: Negative for rash. Neurological: Negative for focal weakness or numbness. Positive HA.   ____________________________________________   PHYSICAL EXAM:  VITAL SIGNS: ED Triage Vitals   Encounter Vitals Group     BP 08/19/22 1022 117/85     Pulse Rate 08/19/22 1022 98     Resp 08/19/22 1022 20     Temp 08/19/22 1022 98.6 F (37 C)     Temp Source 08/19/22 1022 Oral     SpO2 08/19/22 1022 98 %   Constitutional: Alert and oriented. Well appearing and in no acute distress. Eyes: Conjunctivae are normal.  Head: Atraumatic. Nose: No congestion/rhinnorhea. Mouth/Throat: Mucous membranes are moist.   Neck: No stridor.  No meningeal signs.  Cardiovascular: Normal rate, regular rhythm. Good peripheral circulation. Grossly normal heart sounds.   Respiratory: Normal respiratory effort.  No retractions. Lungs CTAB. Gastrointestinal: Soft and nontender. No distention.  Musculoskeletal: No lower extremity tenderness nor edema. No gross deformities of extremities. Neurologic:  Normal speech and language. No gross focal neurologic deficits are appreciated.  Skin:  Skin is warm, dry and intact. No rash noted.  ____________________________________________   LABS (all labs ordered are listed, but only abnormal results are displayed)  Labs Reviewed  COMPREHENSIVE METABOLIC PANEL - Abnormal; Notable for the following components:      Result Value   Creatinine, Ser 1.01 (*)    Calcium 8.6 (*)    AST 12 (*)    Total Bilirubin 0.2 (*)    All other components within normal limits  CBC - Abnormal; Notable for the following components:  Hemoglobin 10.6 (*)    HCT 34.8 (*)    RDW 16.1 (*)    Platelets 484 (*)    All other components within normal limits  URINALYSIS, ROUTINE W REFLEX MICROSCOPIC - Abnormal; Notable for the following components:   Color, Urine AMBER (*)    APPearance HAZY (*)    Protein, ur 30 (*)    Bacteria, UA RARE (*)    All other components within normal limits  LIPASE, BLOOD  HCG, SERUM, QUALITATIVE  TROPONIN I (HIGH SENSITIVITY)  TROPONIN I (HIGH SENSITIVITY)   ____________________________________________  EKG   EKG  Interpretation Date/Time:  Monday August 19 2022 10:59:39 EDT Ventricular Rate:  91 PR Interval:  142 QRS Duration:  86 QT Interval:  370 QTC Calculation: 455 R Axis:   50  Text Interpretation: Normal sinus rhythm Nonspecific ST and T wave abnormality Abnormal ECG When compared with ECG of 10-Aug-2022 07:43, PREVIOUS ECG IS PRESENT Confirmed by Alona Bene [phone]) on 08/19/2022 11:47:38 AM       ____________________________________________   PROCEDURES  Procedure(s) performed:   Procedures  None  ____________________________________________   INITIAL IMPRESSION / ASSESSMENT AND PLAN / ED COURSE  Pertinent labs & imaging results that were available during my care of the patient were reviewed by me and considered in my medical decision making (see chart for details).   This patient is Presenting for Evaluation of abdominal pain, which does require a range of treatment options, and is a complaint that involves a high risk of morbidity and mortality.  The Differential Diagnoses includes but is not exclusive to acute cholecystitis, intrathoracic causes for epigastric abdominal pain, gastritis, duodenitis, pancreatitis, small bowel or large bowel obstruction, abdominal aortic aneurysm, hernia, gastritis, etc.   Critical Interventions-    Medications  droperidol (INAPSINE) 2.5 MG/ML injection 1.25 mg (1.25 mg Intramuscular Given 08/19/22 1402)  ketorolac (TORADOL) 30 MG/ML injection 30 mg (30 mg Intramuscular Given 08/19/22 1600)    Reassessment after intervention:  symptoms improved.   I decided to review pertinent External Data, and in summary patient with recent admit for abdominal pain and constipation.  She had 2 abdominal CT scans on 7/13.   Clinical Laboratory Tests Ordered, included CBC without leukocytosis.  Mild anemia at 10.6 similar to prior. No UTI. Troponin negative.   Radiologic Tests: Considered repeat abdominal imaging but patient had 2 CT scans on 7/13 which  are reassuring.  Has history of chronic abdominal pain in the setting of known IBS, fibromyalgia, uterine fibroid.  Abdominal exam is reassuring.  Plan to defer imaging for now.  Cardiac Monitor Tracing which shows NSR.    Social Determinants of Health Risk denies EtOH or THC.   Medical Decision Making: Summary:  Patient presents emergency department for evaluation of abdominal pain and headache.  No meningeal signs.  Describes fever in the triage note but in discussion with the patient and her temps have been in the 99 range.  Afebrile here.  No leukocytosis on CBC.  No meningeal signs.  Exceedingly low suspicion for CNS infection.  Plan to defer abdominal imaging as above and reassess after labs and droperidol.   Reevaluation with update and discussion with patient. Symptoms improved with treatment in the ED. Plan for discharge with close PCP follow up plan.   Considered admission but ED workup is reassuring. Stable for discharge with outpatient follow up plan.   Patient's presentation is most consistent with acute presentation with potential threat to life or bodily function.  Disposition: discharge  ____________________________________________  FINAL CLINICAL IMPRESSION(S) / ED DIAGNOSES  Final diagnoses:  Generalized abdominal pain  Acute nonintractable headache, unspecified headache type    Note:  This document was prepared using Dragon voice recognition software and may include unintentional dictation errors.  Alona Bene, MD, [facility] Emergency Medicine    Kimm Ungaro, Arlyss Repress, MD 08/22/22 1328

## 2022-08-21 ENCOUNTER — Other Ambulatory Visit: Payer: Self-pay | Admitting: Internal Medicine

## 2022-08-21 ENCOUNTER — Other Ambulatory Visit: Payer: Self-pay | Admitting: Student in an Organized Health Care Education/Training Program

## 2022-08-21 DIAGNOSIS — L732 Hidradenitis suppurativa: Secondary | ICD-10-CM

## 2022-08-21 NOTE — Telephone Encounter (Signed)
Next appt scheduled 8/7 with PCP. 

## 2022-08-23 ENCOUNTER — Ambulatory Visit (INDEPENDENT_AMBULATORY_CARE_PROVIDER_SITE_OTHER): Payer: 59 | Admitting: Licensed Clinical Social Worker

## 2022-08-23 ENCOUNTER — Other Ambulatory Visit: Payer: Self-pay | Admitting: Student

## 2022-08-23 ENCOUNTER — Other Ambulatory Visit: Payer: Self-pay | Admitting: Internal Medicine

## 2022-08-23 DIAGNOSIS — F4312 Post-traumatic stress disorder, chronic: Secondary | ICD-10-CM | POA: Diagnosis not present

## 2022-08-23 DIAGNOSIS — M25562 Pain in left knee: Secondary | ICD-10-CM

## 2022-08-23 DIAGNOSIS — F411 Generalized anxiety disorder: Secondary | ICD-10-CM | POA: Diagnosis not present

## 2022-08-23 DIAGNOSIS — M62838 Other muscle spasm: Secondary | ICD-10-CM

## 2022-08-23 DIAGNOSIS — F331 Major depressive disorder, recurrent, moderate: Secondary | ICD-10-CM

## 2022-08-23 DIAGNOSIS — F410 Panic disorder [episodic paroxysmal anxiety] without agoraphobia: Secondary | ICD-10-CM | POA: Diagnosis not present

## 2022-08-23 MED ORDER — METHOCARBAMOL 750 MG PO TABS
1500.0000 mg | ORAL_TABLET | Freq: Three times a day (TID) | ORAL | 0 refills | Status: DC | PRN
Start: 2022-08-23 — End: 2022-09-04

## 2022-08-23 NOTE — Progress Notes (Signed)
Virtual Visit via Video Note  I connected with [name] on 08/23/22 at  8:00 AM EDT by a video enabled telemedicine application and verified that I am speaking with the correct person using two identifiers.  Location: Patient: home Provider: home office   I discussed the limitations of evaluation and management by telemedicine and the availability of in person appointments. The patient expressed understanding and agreed to proceed.   I discussed the assessment and treatment plan with the patient. The patient was provided an opportunity to ask questions and all were answered. The patient agreed with the plan and demonstrated an understanding of the instructions.   The patient was advised to call back or seek an in-person evaluation if the symptoms worsen or if the condition fails to improve as anticipated.  I provided 40 minutes of non-face-to-face time during this encounter.  THERAPIST PROGRESS NOTE  Session Time: 8:00 AM to 8:40 AM  Participation Level: Active  Behavioral Response: CasualAlertAnxious and Depressed  Type of Therapy: Individual Therapy  Treatment Goals addressed: patient continue to utilize therapy for  supportive and strength-based interventions, provide treatment interventions in the context of patient continuing to seek help and cope with medical issues, anxiety, triggers, coping with mental health symptoms, trauma, utilize therapy as a way for patient to focus on working through current stressors that also helps to distract from pain ProgressTowards Goals: Progressing-Assess therapeutic for patient to process thoughts and feelings around significant stressors of medical issues and get support and validation from therapist  Interventions: Solution Focused, Strength-based, Supportive, and Other: coping  Summary: Nera Golik is a 52 y.o. female who presents with feel awful a lot going on. Went to ER and in hospital 12-14 then other day went. A lot of pain in  stomach running around to sides. [name] it is kidney get over to the hospital then appendix kept her then said constipation. Last time said go to GI may be IBS flaring up, patient has that as well as Chron's disease and diverticulosis diagnosed when [place] born. Her birthday turned 53. Uncle lives in [place] he fell and didn't remember a lot of stuff he went to hospital for his back did CT and found out tumor on brain. Polyp on vocal cords and prostrate cancer. Supposed to get results for polyp but refused to go after tumor on brain. The other day found him with bruise on side of face he won't talk. He talk to mom and brother but not daughter who lives right there. Yesterday he just fell the apartment open unlocked best friend went over didn't answer the phone didn't have phone and keys. Not himself. Went missing filed missing report went all over the place found him sleeping on somebody porch patient says god there to put people there so not harm him. Put him in the hospital can't do assistant living needs nursing home for 24 hour care. Being treated for prostrate cancer and that is no longer present. Patient shares her Dad had prostrate insides bloody mess nobody knew what is was, had to change his diaper traumatic and something never forget bleeding from insides. Patient was 13.  Yesterday began to feel where panty liners and leg connected to body hurt then chills and shaking. Told doctor had  temperature 101 said to call back at 104 when 99 patient gets whoozy cold. Wants to know what is going on and therapist noted this has the been the status quo for awhile. Passed out a glob mucus and jelly  all congealed together showed PCP. Doctor told patient said follow up with GI patient says didn't want to deal with. Therapist noted need a sooner appointment with all these symptoms. Going to call GI to see if they have anything sooner. 7th PCP appointment has appointment with Dr. Lolly Mustache same day. Asked if can change her  appointment with him and they changed. Patient says makes her mental health not healthy. Question marks over their head. Prays no surgery had to have group of doctors consult with pulmonary doctor to put her to sleep. Therapist said find the source of it then give the right treatment. Cause of her mental health is treatment for PTSD think about things in past keeps going over in head replaces with prayer, distraction then health issues coming along and when younger said weight but now when run out of the weight thing where go now?   Therapist reviewed recent stressors that are significant related to medical issues agree with patient they need to find the source of the problem and then they can find an effective treatment but do not seem to find the source.  Therapist validated and provided patient with support encouraging her to get a sooner appointment for GI which she is going to try to do as recent symptoms he related to this.  Validated patient on being frustrated with the medical system which does not come up with an accurate diagnosis for her.  Talked about her mental health makes it worse dealing with trauma and that she has to deal with medical issues.  Was provided some education on trauma there are unprocessed memories trauma work is processing them reasonably continue to present is because there are isolated networks that need to be connected will continue to talk about this with patient and work on it.  To review as patient fell asleep relating she had not slept all night.  Therapist provided space and support for patient to talk about thoughts and feelings in session. Suicidal/Homicidal: No  Plan: Return again in 2 weeks.2.Assess very helpful for patient to get supportive interventions processed thoughts and feelings in session   Diagnosis: Major depressive disorder, recurrent, moderate, generalized anxiety disorder, chronic PTSD, panic attacks  Collaboration of Care: Other none  needed  Patient/Guardian was advised Release of Information must be obtained prior to any record release in order to collaborate their care with an outside provider. Patient/Guardian was advised if they have not already done so to contact the registration department to sign all necessary forms in order for [name] to release information regarding their care.   Consent: Patient/Guardian gives verbal consent for treatment and assignment of benefits for services provided during this visit. Patient/Guardian expressed understanding and agreed to proceed.   Coolidge Breeze, LCSW 08/23/2022

## 2022-08-23 NOTE — Progress Notes (Signed)
Received called from after-hours line/switchboard from patient.   Lindsay [name] reported that she has had continued muscle spasms of her upper and lower extremities for the past week or so, which she related to a bad fibromyalgia flare. She is usually able to control them with Tizanidine but this medication was not been effective for quite some time. Discussed with patient her current coping mechanisms and recent ED visit after hospital discharge. Reviewed recent renal function as well.  Patient was prescribed a short course of Robaxin to trial for the next two weeks. Patient will see PCP in the office on August 7, at which time she can be evaluated in person. Discussed precautionary symptoms to seek immediate medical attention. Additionally, advised against drinking alcohol and being careful with other centrally acting medications.  Morene Crocker, MD [facility] Internal Medicine Program - PGY-2 08/23/2022, 3:09 PM Please contact the on call pager after 5 pm and on weekends at [phone].

## 2022-08-29 NOTE — Telephone Encounter (Signed)
PT is calling back about IBS flare and stomach pains. Requesting to speak with a nurse about symptoms. Please advise.

## 2022-08-29 NOTE — Telephone Encounter (Signed)
Spoke with the patient. She is calling about her abdominal discomfort and mucous stools. She is scheduled for an office evaluation 09/19/22. Last seen 11/24/2018.  Patient is having daily bowel movement and does not feel she is constipated. Stool is described as formed and with "lots of mucous." She passes stool 2 or 3 times a day. She is taking Senokot for her bowel movements. She has dicyclomine for her abdominal discomfort. No bloody stools. No vomiting. Suggested she encourage fluids using sports drink, broth soups and water.  She requires a 3 day notice for transportation and I will monitor the schedule for an opening before 09/19/22.

## 2022-09-01 ENCOUNTER — Other Ambulatory Visit (HOSPITAL_COMMUNITY): Payer: Self-pay | Admitting: Psychiatry

## 2022-09-01 DIAGNOSIS — F331 Major depressive disorder, recurrent, moderate: Secondary | ICD-10-CM

## 2022-09-01 DIAGNOSIS — F4312 Post-traumatic stress disorder, chronic: Secondary | ICD-10-CM

## 2022-09-04 ENCOUNTER — Ambulatory Visit (INDEPENDENT_AMBULATORY_CARE_PROVIDER_SITE_OTHER): Payer: 59 | Admitting: Internal Medicine

## 2022-09-04 ENCOUNTER — Encounter: Payer: Self-pay | Admitting: Internal Medicine

## 2022-09-04 ENCOUNTER — Telehealth (HOSPITAL_COMMUNITY): Payer: 59 | Admitting: Psychiatry

## 2022-09-04 ENCOUNTER — Other Ambulatory Visit: Payer: Self-pay

## 2022-09-04 VITALS — BP 125/93 | HR 102 | Temp 99.3°F | Ht 70.0 in | Wt 314.3 lb

## 2022-09-04 DIAGNOSIS — F1190 Opioid use, unspecified, uncomplicated: Secondary | ICD-10-CM

## 2022-09-04 DIAGNOSIS — R1013 Epigastric pain: Secondary | ICD-10-CM

## 2022-09-04 DIAGNOSIS — M797 Fibromyalgia: Secondary | ICD-10-CM | POA: Diagnosis not present

## 2022-09-04 DIAGNOSIS — K589 Irritable bowel syndrome without diarrhea: Secondary | ICD-10-CM

## 2022-09-04 DIAGNOSIS — Z79899 Other long term (current) drug therapy: Secondary | ICD-10-CM

## 2022-09-04 MED ORDER — TRAMADOL HCL 50 MG PO TABS
50.0000 mg | ORAL_TABLET | ORAL | 0 refills | Status: DC | PRN
Start: 2022-09-04 — End: 2022-09-04

## 2022-09-04 MED ORDER — KETOROLAC TROMETHAMINE 60 MG/2ML IM SOLN
60.0000 mg | Freq: Once | INTRAMUSCULAR | Status: AC
Start: 2022-09-04 — End: 2022-09-04
  Administered 2022-09-04: 60 mg via INTRAMUSCULAR

## 2022-09-04 MED ORDER — TRAMADOL HCL 50 MG PO TABS
50.0000 mg | ORAL_TABLET | ORAL | 0 refills | Status: DC | PRN
Start: 2022-09-04 — End: 2022-12-04

## 2022-09-04 MED ORDER — TIZANIDINE HCL 4 MG PO TABS: 4.0000 mg | ORAL_TABLET | Freq: Three times a day (TID) | ORAL | 5 refills | Status: DC | PRN

## 2022-09-04 NOTE — Progress Notes (Signed)
Subjective:   Patient ID: Lindsay [name] female   DOB: 02-16-1970 52 y.o.   MRN: [id number]  HPI: [name] is a 52 y.o. female with past medical history outlined below here for follow up of acute on chronic pain. For further details of today's visit, please refer to the assessment and plan below.   Past Medical History:  Diagnosis Date   Acid reflux    Anemia    Iron Def   Anorexia    CHF (congestive heart failure) (HCC)    Chronic kidney disease    Nephrotic syndrome   Colon polyp 2009   Depression with anxiety    Edema leg    Fibroid tumor 04/2022   Fibromyalgia    Hemorrhoids    Hidradenitis suppurativa    Hypertension    IBS (irritable bowel syndrome)    Low back pain    Migraines    Morbidly obese (HCC)    Neuromuscular disorder (HCC)    fibromyalgia   Neuropathy    Panic attacks    Polyp of vocal cord or larynx    Stroke (HCC)    Tonsil pain    Current Outpatient Medications  Medication Sig Dispense Refill   tiZANidine (ZANAFLEX) 4 MG tablet Take 1 tablet (4 mg total) by mouth every 8 (eight) hours as needed for muscle spasms. 90 tablet 5   ACCU-CHEK GUIDE test strip USE ONE TEST STRIP TO CHECK BLOOD SUGARS ONCE A DAY AS NEEDED 100 strip 1   Accu-Chek Softclix Lancets lancets USE ONE LANCET TO CHECK BLOOD SUGAR ONE A DAY AS NEEDED 100 each 1   albuterol (PROVENTIL) (2.5 MG/3ML) 0.083% nebulizer solution TAKE 3 ML (2.5 MG TOTAL) BY NEBULIZATION EVERY 4 HOURS AS NEEDED FOR WHEEZING OR SHORTNESS OF BREATH 150 mL 0   albuterol (VENTOLIN HFA) 108 (90 Base) MCG/ACT inhaler TAKE 2 PUFFS BY MOUTH EVERY 6 HOURS AS NEEDED FOR WHEEZE OR SHORTNESS OF BREATH 8.5 each 0   atorvastatin (LIPITOR) 40 MG tablet TAKE 1 TABLET BY MOUTH EVERY DAY 90 tablet 3   azelastine (ASTELIN) 0.1 % nasal spray Place 1 spray into both nostrils 2 (two) times daily. Use in each nostril as directed 30 mL 12   Blood Glucose Monitoring Suppl (ACCU-CHEK GUIDE ME) w/Device KIT Dispense one device 1  kit 0   Brexpiprazole (REXULTI) 3 MG TABS Take 1 tablet (3 mg total) by mouth daily. 30 tablet 0   cetirizine (ZYRTEC) 10 MG tablet Take 1 tablet (10 mg total) by mouth daily. 90 tablet 1   clindamycin (CLINDAGEL) 1 % gel APPLY TO AFFECTED AREA TWICE A DAY 30 g 0   [facility] D3 25 MCG (1000 UT) capsule TAKE 1 CAPSULE BY MOUTH EVERY DAY 90 capsule 1   diclofenac (VOLTAREN) 75 MG EC tablet Take 75 mg by mouth 2 (two) times daily.     dicyclomine (BENTYL) 10 MG capsule Take 2 capsules (20 mg total) by mouth 3 (three) times daily as needed for spasms (abdominal pain). 30 capsule 1   doxepin (SINEQUAN) 25 MG capsule Take 1 capsule (25 mg total) by mouth at bedtime. 30 capsule 0   famotidine (PEPCID) 20 MG tablet Take 1 tablet (20 mg total) by mouth daily. 90 tablet 1   ferrous sulfate 325 (65 FE) MG EC tablet Take 1 tablet (325 mg total) by mouth daily with breakfast. (Patient taking differently: Take 325 mg by mouth every other day.) 90 tablet 1   fluticasone (FLONASE) 50 MCG/ACT  nasal spray INSTILL 1 SPRAY INTO BOTH NOSTRILS DAILY (Patient taking differently: Place 1 spray into both nostrils daily.) 16 mL 2   ipratropium (ATROVENT) 0.06 % nasal spray PLEASE SEE ATTACHED FOR DETAILED DIRECTIONS     lamoTRIgine (LAMICTAL) 150 MG tablet Take 1 tablet (150 mg total) by mouth daily. 90 tablet 0   meclizine (ANTIVERT) 25 MG tablet Take 1 tablet (25 mg total) by mouth 3 (three) times daily as needed for dizziness. 90 tablet 2   megestrol (MEGACE) 40 MG tablet Take 2 tablets (80 mg total) by mouth daily. 180 tablet 1   meloxicam (MOBIC) 7.5 MG tablet Take 1-2 tablets (7.5-15 mg) by mouth daily as needed. 14 tablet 1   metoprolol succinate (TOPROL-XL) 50 MG 24 hr tablet Take 1 tablet (50 mg total) by mouth daily. 90 tablet 3   omeprazole (PRILOSEC) 40 MG capsule Take 40 mg by mouth daily.     polyethylene glycol (MIRALAX / GLYCOLAX) 17 g packet Take 17 g by mouth daily. 14 each 0   prochlorperazine (COMPAZINE) 5 MG  tablet Take 1 tablet (5 mg total) by mouth every 8 (eight) hours as needed for up to 2 days for nausea or vomiting. 6 tablet 0   Semaglutide, 2 MG/DOSE, (OZEMPIC, 2 MG/DOSE,) 8 MG/3ML SOPN Inject 2 mg into the skin once a week. 9 mL 1   senna (SENOKOT) 8.6 MG TABS tablet Take 2 tablets (17.2 mg total) by mouth 2 (two) times daily. 120 tablet 0   SUMAtriptan (IMITREX) 50 MG tablet Take 1 tablet (50 mg total) by mouth every 2 (two) hours as needed for up to 10 doses for migraine. May repeat in 2 hours if headache persists or recurs. 10 tablet 0   torsemide (DEMADEX) 20 MG tablet Take 2 tablets (40 mg total) by mouth 2 (two) times daily. 360 tablet 1   traMADol (ULTRAM) 50 MG tablet Take 1 tablet (50 mg total) by mouth every 4 (four) hours as needed. 180 tablet 0   No current facility-administered medications for this visit.   Family History  Problem Relation Age of Onset   Diabetes Mother    Heart disease Mother        valve leak   Anxiety disorder Mother    Depression Mother    High blood pressure Mother    Kidney failure Mother    Cancer Father        prostate   Heart disease Father    Learning disabilities Sister    Depression Sister    Depression Sister    Anxiety disorder Sister    Colon cancer Neg Hx    Social History   Socioeconomic History   Marital status: Single    Spouse name: Not on file   Number of children: 1   Years of education: 11   Highest education level: Not on file  Occupational History   Occupation: Disabled  Tobacco Use   Smoking status: Former    Current packs/day: 0.00    Average packs/day: 0.1 packs/day for 25.0 years (2.5 ttl pk-yrs)    Types: Cigarettes    Start date: 05/24/1994    Quit date: 05/24/2019    Years since quitting: 3.2   Smokeless tobacco: Never   Tobacco comments:    quit in April.  Vaping Use   Vaping status: Never Used  Substance and Sexual Activity   Alcohol use: Not Currently    Alcohol/week: 0.0 standard drinks of alcohol  Comment: none sine 05/2018   Drug use: Not Currently    Frequency: 3.0 times per week    Types: Marijuana   Sexual activity: Yes    Birth control/protection: Implant    Comment: placed in March 2024  Other Topics Concern   Not on file  Social History Narrative   Lives at home w/ her mother and daughter   Right-handed   Caffeine: about 3 Cokes per week   Social Determinants of Health   Financial Resource Strain: High Risk (03/18/2022)   Overall Financial Resource Strain (CARDIA)    Difficulty of Paying Living Expenses: Hard  Food Insecurity: Food Insecurity Present (08/10/2022)   Hunger Vital Sign    Worried About Running Out of Food in the Last Year: Sometimes true    Ran Out of Food in the Last Year: Sometimes true  Transportation Needs: Unmet Transportation Needs (08/10/2022)   PRAPARE - Transportation    Lack of Transportation (Medical): Yes    Lack of Transportation (Non-Medical): Yes  Physical Activity: Insufficiently Active (08/14/2021)   Exercise Vital Sign    Days of Exercise per Week: 1 day    Minutes of Exercise per Session: 40 min  Stress: No Stress Concern Present (08/14/2021)   [employer] of Occupational Health - Occupational Stress Questionnaire    Feeling of Stress : Only a little  Social Connections: Moderately Isolated (03/18/2022)   Social Connection and Isolation Panel [NHANES]    Frequency of Communication with Friends and Family: More than three times a week    Frequency of Social Gatherings with Friends and Family: Never    Attends Religious Services: 1 to 4 times per year    Active Member of [employer] or Organizations: No    Attends Banker Meetings: Never    Marital Status: Never married     Objective:  Physical Exam:  Vitals:   09/04/22 0823  BP: (!) 125/93  Pulse: (!) 102  Temp: 99.3 F (37.4 C)  TempSrc: Oral  SpO2: 100%  Weight: (!) 314 lb 4.8 oz (142.6 kg)  Height: 5\' 10"  (1.778 m)    Constitutional: Diaphoretic,  appears uncomfortable  Cardiovascular: Tachycardic but regular Pulmonary/Chest: Distant breath sounds throughout, no respiratory distress Abdominal: Tenderness to palpation in the epigastric region, diminished bowel sounds. Soft, non distended.  Extremities: Warm, no edema  Neurological: No tremor, no rigidity, no achilles clonus (unable to induce clonus), bilateral patellar reflexes are diminished    Assessment & Plan:   Morbid obesity (HCC) Patient has had an excellent response to Ozempic.  On chart review, weight was 405 pounds in January 2022.  Weight is down to 314 pounds today.  She has lost a total of 91 pounds.  I emphasized this huge accomplishment and encouraged her to keep up the hard work with her diet.  Continue Ozempic 2 mg weekly.  I do worry about some of her chronic GI symptoms being related to her GLP-1 agonist, however patient does not think they are related.  I am hesitant to stop this therapy as she has had such an excellent response.  If GI symptoms continue could consider decreasing dose to 1 mg.   IBS (irritable bowel syndrome) Patient has chronic abdominal pain, IBS, and intermittent constipation / diarrhea. Today she has acute / subacute epigastric pain with associated nausea and low grade fevers. She was seen in the ED for this recently. Imaging and lab work were all reassuring. I do not have a good etiology for  her symptoms, possible viral gastroenteritis however we also discussed her polypharmacy at length. She is very diaphoretic and mildly tachycardic on exam. She is prescribed tramadol for her chronic fibromyalgia pain and I explained the risk of tolerance and opioid withdrawal if she were to suddenly stop this medicine. She is taking this regularly, last took yesterday. Does not think she is withdrawing from tramadol but will be aware to monitor for this. Temperature is 99.61F here today. She has no tremor, patellar reflexes are diminished bilaterally, no rigidity or  clonus. I have low suspicion for serotonin syndrome but she is on multiple serotonergic medicines. She has been taking both robaxin and tizanidine intermittently. Again I advised we use one or the other and not switch between. She has opted to use tizanidine alone, I have d/c'd robaxin from her medication list. Moving forward, minimize serotonergic meds, consider discontinuing tramadol if no improvement.   Chronic, continuous use of opioids Last Utox was appropriate. Reviewed PDMP. [facility] 3 [facility]   Polypharmacy Patient is on multiple high risk medications.  I prescribed a muscle relaxer and tramadol for her chronic refractory fibromyalgia symptoms. These medicines do provide some relief from her chronic pain and allow her to be functional.  She is also prescribed doxepin, Rexulti, and Lamictal which are managed by her psychiatrist.  She was recently discontinued on her chronic benzodiazepine per psych.  We discussed her polypharmacy at length today.  I am concerned that some of her chronic intermittent symptoms could be adverse side effects from some of these medications.  I advised her on the symptoms of opioid tolerance and withdrawal, she does not think this is an issue and does find benefit with the tramadol so we have opted to continue this for now.  But will monitor closely.  She also takes NSAIDs intermittently for her chronic pain.  Frequently switches between meloxicam, naproxen, and oral diclofenac.  Her renal function has been stable however I advised that she limit her NSAID use and pick only 1 NSAID.  Use sparingly only for acute / severe pain and not to treat her chronic pain. She feels that the meloxicam provides the most benefit so I have discontinued diclofenac and naproxen. Lastly, she is on multiple serotonergic medications. Low suspicion for serotonin syndrome currently but will be cautious with any new medications. She has also been switching between tizanidine and  robaxin and I have advised that we will only be continuing one muscle relaxer moving forward. She has opted to continue tizanidine. Part of the problem with her polypharmacy is that she often calls in or presents to the ED when she develops acute symptoms. She has been managed by multiple providers. Will try and schedule her with myself only for better continuity.   Fibromyalgia Continue tramadol and tizanidine PRN. Has meloxicam PRN for severe / acute pain but should limit NSAID use for her chronic pain. Discussed sticking with only one muscle relaxer and one NSAID moving forward. I have d/c'd naproxen, diclofenac, and robaxin from her medication list. Recent labs with normal renal function. Given 1 x IM toradol today.

## 2022-09-04 NOTE — Assessment & Plan Note (Addendum)
Patient has had an excellent response to Ozempic.  On chart review, weight was 405 pounds in January 2022.  Weight is down to 314 pounds today.  She has lost a total of 91 pounds.  I emphasized this huge accomplishment and encouraged her to keep up the hard work with her diet.  Continue Ozempic 2 mg weekly.  I do worry about some of her chronic GI symptoms being related to her GLP-1 agonist, however patient does not think they are related.  I am hesitant to stop this therapy as she has had such an excellent response.  If GI symptoms continue could consider decreasing dose to 1 mg.

## 2022-09-04 NOTE — Assessment & Plan Note (Signed)
Continue tramadol and tizanidine PRN. Has meloxicam PRN for severe / acute pain but should limit NSAID use for her chronic pain. Discussed sticking with only one muscle relaxer and one NSAID moving forward. I have d/c'd naproxen, diclofenac, and robaxin from her medication list. Recent labs with normal renal function. Given 1 x IM toradol today.

## 2022-09-04 NOTE — Assessment & Plan Note (Signed)
Patient has chronic abdominal pain, IBS, and intermittent constipation / diarrhea. Today she has acute / subacute epigastric pain with associated nausea and low grade fevers. She was seen in the ED for this recently. Imaging and lab work were all reassuring. I do not have a good etiology for her symptoms, possible viral gastroenteritis however we also discussed her polypharmacy at length. She is very diaphoretic and mildly tachycardic on exam. She is prescribed tramadol for her chronic fibromyalgia pain and I explained the risk of tolerance and opioid withdrawal if she were to suddenly stop this medicine. She is taking this regularly, last took yesterday. Does not think she is withdrawing from tramadol but will be aware to monitor for this. Temperature is 99.54F here today. She has no tremor, patellar reflexes are diminished bilaterally, no rigidity or clonus. I have low suspicion for serotonin syndrome but she is on multiple serotonergic medicines. She has been taking both robaxin and tizanidine intermittently. Again I advised we use one or the other and not switch between. She has opted to use tizanidine alone, I have d/c'd robaxin from her medication list. Moving forward, minimize serotonergic meds, consider discontinuing tramadol if no improvement.

## 2022-09-04 NOTE — Assessment & Plan Note (Signed)
Patient is on multiple high risk medications.  I prescribed a muscle relaxer and tramadol for her chronic refractory fibromyalgia symptoms. These medicines do provide some relief from her chronic pain and allow her to be functional.  She is also prescribed doxepin, Rexulti, and Lamictal which are managed by her psychiatrist.  She was recently discontinued on her chronic benzodiazepine per psych.  We discussed her polypharmacy at length today.  I am concerned that some of her chronic intermittent symptoms could be adverse side effects from some of these medications.  I advised her on the symptoms of opioid tolerance and withdrawal, she does not think this is an issue and does find benefit with the tramadol so we have opted to continue this for now.  But will monitor closely.  She also takes NSAIDs intermittently for her chronic pain.  Frequently switches between meloxicam, naproxen, and oral diclofenac.  Her renal function has been stable however I advised that she limit her NSAID use and pick only 1 NSAID.  Use sparingly only for acute / severe pain and not to treat her chronic pain. She feels that the meloxicam provides the most benefit so I have discontinued diclofenac and naproxen. Lastly, she is on multiple serotonergic medications. Low suspicion for serotonin syndrome currently but will be cautious with any new medications. She has also been switching between tizanidine and robaxin and I have advised that we will only be continuing one muscle relaxer moving forward. She has opted to continue tizanidine. Part of the problem with her polypharmacy is that she often calls in or presents to the ED when she develops acute symptoms. She has been managed by multiple providers. Will try and schedule her with myself only for better continuity.

## 2022-09-04 NOTE — Patient Instructions (Signed)
Ms. Spearin,  It was a pleasure to see you. I am sorry to hear about all of your symptoms. I hope you feel better. I have refilled your tramadol and muscle relaxer. Please only take tizanidine (do not mix with robaxin). Also, please only take one NSAID (ibuprofen / motrin / aleve or mobic / meloxicam). Whichever one works best for you.   Follow up with me again in 3 months. You are doing great with your weight loss. Keep up the great work.   If you have any questions or concerns, call our clinic at [phone] or after hours call [phone] and ask for the internal medicine resident on call.   Thank you!  Dr. Reece Agar

## 2022-09-04 NOTE — Assessment & Plan Note (Signed)
Last Utox was appropriate. Reviewed PDMP. Sent 3 rx of her tramadol PRN to [facility].

## 2022-09-06 ENCOUNTER — Ambulatory Visit (INDEPENDENT_AMBULATORY_CARE_PROVIDER_SITE_OTHER): Payer: 59 | Admitting: Licensed Clinical Social Worker

## 2022-09-06 DIAGNOSIS — F411 Generalized anxiety disorder: Secondary | ICD-10-CM | POA: Diagnosis not present

## 2022-09-06 DIAGNOSIS — F331 Major depressive disorder, recurrent, moderate: Secondary | ICD-10-CM | POA: Diagnosis not present

## 2022-09-06 DIAGNOSIS — F4312 Post-traumatic stress disorder, chronic: Secondary | ICD-10-CM | POA: Diagnosis not present

## 2022-09-06 DIAGNOSIS — F410 Panic disorder [episodic paroxysmal anxiety] without agoraphobia: Secondary | ICD-10-CM

## 2022-09-06 NOTE — Progress Notes (Signed)
Virtual Visit via Video Note  I connected with Lindsay [name] on 09/06/22 at  8:00 AM EDT by a video enabled telemedicine application and verified that I am speaking with the correct person using two identifiers.  Location: Patient: home Provider: home office   I discussed the limitations of evaluation and management by telemedicine and the availability of in person appointments. The patient expressed understanding and agreed to proceed.   I discussed the assessment and treatment plan with the patient. The patient was provided an opportunity to ask questions and all were answered. The patient agreed with the plan and demonstrated an understanding of the instructions.   The patient was advised to call back or seek an in-person evaluation if the symptoms worsen or if the condition fails to improve as anticipated.  I provided 40 minutes of non-face-to-face time during this encounter.  THERAPIST PROGRESS NOTE  Session Time: 8:00 AM to 8:40 AM  Participation Level: Active  Behavioral Response: CasualAlertDysphoric  Type of Therapy: Individual Therapy  Treatment Goals addressed: atient continue to utilize therapy for  supportive and strength-based interventions, provide treatment interventions in the context of patient continuing to seek help and cope with medical issues, anxiety, triggers, coping with mental health symptoms, trauma, utilize therapy as a way for patient to focus on working through current stressors that also helps to distract from pain  ProgressTowards Goals: Progressing-assess therapy sessions significant support for patient as she deals with stressors and ongoing medical issues  Interventions: Solution Focused, Strength-based, Supportive, and Other: Open  Summary: Lindsay [name] is a 52 y.o. female who presents with doesn't have hot water. Last Friday tooth broke off, Saturday fell in grocery store really hurting. Going to go to the dentist. Told the doctor said ok  patient says not ok wanted to spaz out but didn't maintained composure no diagnosis and patient wants to know what is going on with her falling then no diagnosis? Told her keeping the appointment for GI too much doctor said to keep appointment all that made her want to go off on her crying pain from neck down. Even rear hurts fell backwards. Stomach still painful panty line area in the curves worse stomach not as much focus on pain falling. May end up at the emergency room. Should have gone that day but Mom was with her that day.  Uncle found out have brain cancer don't know what stage doesn't want to come back here. He has been in [place]. since 16-18 been there. Family want him to  come back to [place]. His daughter thinking he could go assisted living he is not going to go. Entire life make up his mind not going to change. He told patient to go to doctor's appointments do what they tell you and if can't do anything then recognize it is your time. Patient says not frantic like everybody else he is satisfied with his life. Other stressed but patient not when God calls you home time to go it is what is-is what he told her. Think about herself preparing her. Therapist said get to appointments still point where get to doctors. Patient shares she is worried about health nobody knows what is going on. Labs are ok. At the point feels like saying "fuck it" therapist noted her role is to continue to help and encourage patient to do what she can to take care of herself to move forward. Psychiatrist patient reported said she has been In pain for years he said what are  they going to do about it? Therapist agrees seems been pain for a long time and would like patient to get more help for it. Another issue can sweat really badly. Could be too much serotonin too much medications doctor told her scared her to give something something could happen.  Therapist as well agrees if she was managing would be careful with patient's  meds.  Hurting so bad thinks should go to emergency room thought it would go away but it didn't should have been there days ago.  Needed to tell somebody fell at grocery store and have somebody pay attention.  Why needed to have this appointment crawling not supposed to on knees.        Assess therapeutic relationship and session helpful for patient as she said needed to tell somebody she had fallen in the grocery store somebody who would listen assess this in itself helpful for patient for someone to listen and care.  Patient shared what psychiatrist said patient been in pain for years and wonder what doctors are going to do therapist feels the same way and happy to hear not the only 1.  At the same time therapist shared her role someone who is keeps encouraging patient to get her her doctors appointment to get help and a necessary part of getting better.  Patient processed feelings related to incidents that have happened to her place to get support also processed how she feels about her uncle.  Therapist providing her feedback he seems to be coming to some acceptance and peace and only can hope to emulate patient herself recognizing this so not getting frantic like the rest of family.  Therapist encouraging patient to get to GI encouraging her to go to emergency room as she still in pain and needs the help.  Therapist will continue to be support and advocate to get the help she needs important aspect is patient having a place where she is heard Suicidal/Homicidal: No  Plan: Return again in 4 weeks.2.Assess very helpful for patient to get supportive interventions processed thoughts and feelings in session  Diagnosis: Major depressive disorder, recurrent, moderate, generalized anxiety disorder, chronic PTSD, panic attacks  Collaboration of Care: Other none needed  Patient/Guardian was advised Release of Information must be obtained prior to any record release in order to collaborate their care with an  outside provider. Patient/Guardian was advised if they have not already done so to contact the registration department to sign all necessary forms in order for [name] to release information regarding their care.   Consent: Patient/Guardian gives verbal consent for treatment and assignment of benefits for services provided during this visit. Patient/Guardian expressed understanding and agreed to proceed.   Coolidge Breeze, LCSW 09/06/2022

## 2022-09-13 ENCOUNTER — Telehealth: Payer: Self-pay

## 2022-09-13 NOTE — Telephone Encounter (Addendum)
Patient called regading a recent fall, patient stated she cannot fell her legs. Advised patient we don't have any available appointments until the beginning of September. Advised patient to go to the ED or urgent care. Call was transferred to triage Valentina Gu

## 2022-09-13 NOTE — Telephone Encounter (Signed)
Call from patient has had a recent fall in the grocery store on 08/31/2022.  Is now having pain in both legs.  Current pain med is not helping.  Unable to walk without pain.  Patient was advised to go to the ER for the inability to walk and the increase in pain.  Refuses to go to the ER.  Prefers to get something for Neuropathy which she thinks will help with the pain. No available appointments in the Clinics.

## 2022-09-13 NOTE — Telephone Encounter (Signed)
RTC from patient will go to the ER today.

## 2022-09-13 NOTE — Telephone Encounter (Signed)
I would not expect a fall to worsen neuropathic type pain. If she is unable to walk after the fall, I agree with recommending evaluation in either urgent care or the ED. If she can walk, but is just sore, we can set up an appointment at our next available to evaluate, and she can use supportive care over the coming days.

## 2022-09-16 DIAGNOSIS — I5042 Chronic combined systolic (congestive) and diastolic (congestive) heart failure: Secondary | ICD-10-CM | POA: Diagnosis not present

## 2022-09-16 DIAGNOSIS — I10 Essential (primary) hypertension: Secondary | ICD-10-CM | POA: Diagnosis not present

## 2022-09-16 DIAGNOSIS — R0789 Other chest pain: Secondary | ICD-10-CM | POA: Diagnosis not present

## 2022-09-16 DIAGNOSIS — E1122 Type 2 diabetes mellitus with diabetic chronic kidney disease: Secondary | ICD-10-CM | POA: Diagnosis not present

## 2022-09-17 ENCOUNTER — Emergency Department (HOSPITAL_COMMUNITY): Payer: 59

## 2022-09-17 ENCOUNTER — Emergency Department (HOSPITAL_COMMUNITY)
Admission: EM | Admit: 2022-09-17 | Discharge: 2022-09-17 | Disposition: A | Discharge status: Home / Self Care | Payer: 59 | Attending: Emergency Medicine | Admitting: Emergency Medicine

## 2022-09-17 DIAGNOSIS — M5032 Other cervical disc degeneration, mid-cervical region, unspecified level: Secondary | ICD-10-CM | POA: Diagnosis not present

## 2022-09-17 DIAGNOSIS — S3992XA Unspecified injury of lower back, initial encounter: Secondary | ICD-10-CM | POA: Diagnosis present

## 2022-09-17 DIAGNOSIS — M546 Pain in thoracic spine: Secondary | ICD-10-CM | POA: Diagnosis not present

## 2022-09-17 DIAGNOSIS — W182XXA Fall in (into) shower or empty bathtub, initial encounter: Secondary | ICD-10-CM | POA: Insufficient documentation

## 2022-09-17 DIAGNOSIS — S39012A Strain of muscle, fascia and tendon of lower back, initial encounter: Secondary | ICD-10-CM | POA: Insufficient documentation

## 2022-09-17 DIAGNOSIS — W19XXXA Unspecified fall, initial encounter: Secondary | ICD-10-CM

## 2022-09-17 DIAGNOSIS — Z794 Long term (current) use of insulin: Secondary | ICD-10-CM | POA: Insufficient documentation

## 2022-09-17 DIAGNOSIS — M545 Low back pain, unspecified: Secondary | ICD-10-CM | POA: Diagnosis not present

## 2022-09-17 DIAGNOSIS — M47812 Spondylosis without myelopathy or radiculopathy, cervical region: Secondary | ICD-10-CM | POA: Diagnosis not present

## 2022-09-17 LAB — URINALYSIS, W/ REFLEX TO CULTURE (INFECTION SUSPECTED)
Bilirubin Urine: NEGATIVE
Glucose, UA: NEGATIVE mg/dL
Hgb urine dipstick: NEGATIVE
Ketones, ur: NEGATIVE mg/dL
Leukocytes,Ua: NEGATIVE
Nitrite: NEGATIVE
Protein, ur: NEGATIVE mg/dL
Specific Gravity, Urine: 1.025 (ref 1.005–1.030)
pH: 5 (ref 5.0–8.0)

## 2022-09-17 MED ORDER — ACETAMINOPHEN 500 MG PO TABS: 500.0000 mg | ORAL_TABLET | Freq: Four times a day (QID) | ORAL | 0 refills | Status: AC | PRN

## 2022-09-17 MED ORDER — METHOCARBAMOL 500 MG PO TABS
500.0000 mg | ORAL_TABLET | Freq: Once | ORAL | Status: AC
  Administered 2022-09-17: 500 mg via ORAL
  Filled 2022-09-17: qty 1

## 2022-09-17 MED ORDER — METHOCARBAMOL 500 MG PO TABS: 500.0000 mg | ORAL_TABLET | Freq: Two times a day (BID) | ORAL | 0 refills | Status: DC

## 2022-09-17 MED ORDER — OXYCODONE-ACETAMINOPHEN 5-325 MG PO TABS
1.0000 | ORAL_TABLET | Freq: Once | ORAL | Status: AC
  Administered 2022-09-17: 1 via ORAL
  Filled 2022-09-17: qty 1

## 2022-09-17 MED ORDER — OXYCODONE-ACETAMINOPHEN 5-325 MG PO TABS: 1.0000 | ORAL_TABLET | Freq: Three times a day (TID) | ORAL | 0 refills | Status: DC | PRN

## 2022-09-17 MED ORDER — NAPROXEN 500 MG PO TABS: 500.0000 mg | ORAL_TABLET | Freq: Two times a day (BID) | ORAL | 0 refills | Status: DC

## 2022-09-17 MED ORDER — NAPROXEN 250 MG PO TABS
500.0000 mg | ORAL_TABLET | Freq: Once | ORAL | Status: AC
  Administered 2022-09-17: 500 mg via ORAL
  Filled 2022-09-17: qty 2

## 2022-09-17 NOTE — ED Provider Notes (Signed)
[place] EMERGENCY DEPARTMENT AT [facility] Provider Note   CSN: [id number] Arrival date & time: 09/17/22  0840     History  Chief Complaint  Patient presents with   Fall   Back Pain    Lindsay [name] is a 52 y.o. female.  HPI    SUBJECTIVE:  Lindsay [name] is a 52 y.o. female who complains of an injury causing low back pain 2 hour(s) ago. The pain is positional with bending or lifting, with radiation down the legs. Mechanism of injury: Patient fell in the shower today.  She had a fall about 2 weeks ago as well. Symptoms have been acute and constant since that time. Prior history of back problems: no prior back problems and previous osteoarthritis of lumbar spine. There is numbness in the legs.  Numbness is present over the leg below the knee, both sides.  Patient does not have previous history of numbness in her legs with her back pain.  Back pain is radiating down both of the legs. Patient denies any urinary incontinence, retention, saddle anesthesia.  She was able to ambulate with assistance after the fall.  OBJECTIVE/physical exam: Vital signs as noted above. Patient appears to be in mild to moderate pain, antalgic gait noted. Lumbosacral spine area reveals no local tenderness or mass. Painful and reduced LS ROM noted. Straight leg raise is positive at 30 degrees on bilateral. DTR's, motor strength and sensation normal, including heel and toe gait.  Peripheral pulses are palpable. Lumbar spine X-Ray: shows DJD changes, likely chronic.    Home Medications Prior to Admission medications   Medication Sig Start Date End Date Taking? Authorizing Provider  acetaminophen (TYLENOL) 500 MG tablet Take 1 tablet (500 mg total) by mouth every 6 (six) hours as needed. 09/17/22  Yes Derwood Kaplan, MD  methocarbamol (ROBAXIN) 500 MG tablet Take 1 tablet (500 mg total) by mouth 2 (two) times daily. 09/17/22  Yes Gionna Polak, MD  naproxen (NAPROSYN) 500 MG tablet Take 1 tablet  (500 mg total) by mouth 2 (two) times daily. 09/17/22  Yes Derwood Kaplan, MD  oxyCODONE-acetaminophen (PERCOCET/ROXICET) 5-325 MG tablet Take 1 tablet by mouth every 8 (eight) hours as needed for severe pain. 09/17/22  Yes Derwood Kaplan, MD  ACCU-CHEK GUIDE test strip USE ONE TEST STRIP TO CHECK BLOOD SUGARS ONCE A DAY AS NEEDED 11/06/21   [name], MD  Accu-Chek Softclix Lancets lancets USE ONE LANCET TO CHECK BLOOD SUGAR ONE A DAY AS NEEDED 07/03/22   [name], MD  albuterol (PROVENTIL) (2.5 MG/3ML) 0.083% nebulizer solution TAKE 3 ML (2.5 MG TOTAL) BY NEBULIZATION EVERY 4 HOURS AS NEEDED FOR WHEEZING OR SHORTNESS OF BREATH 07/11/21   Olalere, Adewale A, MD  albuterol (VENTOLIN HFA) 108 (90 Base) MCG/ACT inhaler TAKE 2 PUFFS BY MOUTH EVERY 6 HOURS AS NEEDED FOR WHEEZE OR SHORTNESS OF BREATH 05/20/22   Olalere, Adewale A, MD  atorvastatin (LIPITOR) 40 MG tablet TAKE 1 TABLET BY MOUTH EVERY DAY 04/30/22   [name], MD  azelastine (ASTELIN) 0.1 % nasal spray Place 1 spray into both nostrils 2 (two) times daily. Use in each nostril as directed 01/17/22   Marrianne Mood, MD  Blood Glucose Monitoring Suppl (ACCU-CHEK GUIDE ME) w/Device KIT Dispense one device 02/16/20   [name], MD  Brexpiprazole (REXULTI) 3 MG TABS Take 1 tablet (3 mg total) by mouth daily. 08/05/22   [name], Phillips Grout, MD  cetirizine (ZYRTEC) 10 MG tablet Take 1 tablet (10 mg total)  by mouth daily. 08/05/22   [name], MD  clindamycin (CLINDAGEL) 1 % gel APPLY TO AFFECTED AREA TWICE A DAY 08/21/22   [name], MD  [facility] D3 25 MCG (1000 UT) capsule TAKE 1 CAPSULE BY MOUTH EVERY DAY 02/26/22   [name], MD  dicyclomine (BENTYL) 10 MG capsule Take 2 capsules (20 mg total) by mouth 3 (three) times daily as needed for spasms (abdominal pain). 08/11/22   Morene Crocker, MD  doxepin (SINEQUAN) 25 MG capsule Take 1 capsule (25 mg total) by mouth at bedtime. 08/05/22   [name], Phillips Grout, MD   famotidine (PEPCID) 20 MG tablet Take 1 tablet (20 mg total) by mouth daily. 08/05/22   [name], MD  ferrous sulfate 325 (65 FE) MG EC tablet Take 1 tablet (325 mg total) by mouth daily with breakfast. Patient taking differently: Take 325 mg by mouth every other day. 05/24/21 08/10/22  [name], MD  fluticasone (FLONASE) 50 MCG/ACT nasal spray INSTILL 1 SPRAY INTO BOTH NOSTRILS DAILY Patient taking differently: Place 1 spray into both nostrils daily. 06/27/22   [name], MD  ipratropium (ATROVENT) 0.06 % nasal spray PLEASE SEE ATTACHED FOR DETAILED DIRECTIONS 04/14/20   [provider]  lamoTRIgine (LAMICTAL) 150 MG tablet Take 1 tablet (150 mg total) by mouth daily. 08/05/22   [name], Phillips Grout, MD  meclizine (ANTIVERT) 25 MG tablet Take 1 tablet (25 mg total) by mouth 3 (three) times daily as needed for dizziness. 08/05/22   [name], MD  megestrol (MEGACE) 40 MG tablet Take 2 tablets (80 mg total) by mouth daily. 06/10/22   Reva Bores, MD  metoprolol succinate (TOPROL-XL) 50 MG 24 hr tablet Take 1 tablet (50 mg total) by mouth daily. 05/23/21   [name], MD  omeprazole (PRILOSEC) 40 MG capsule Take 40 mg by mouth daily. 08/06/22   [provider]  polyethylene glycol (MIRALAX / GLYCOLAX) 17 g packet Take 17 g by mouth daily. 08/11/22   Morene Crocker, MD  prochlorperazine (COMPAZINE) 5 MG tablet Take 1 tablet (5 mg total) by mouth every 8 (eight) hours as needed for up to 2 days for nausea or vomiting. 08/15/22 08/17/22  [name], MD  Semaglutide, 2 MG/DOSE, (OZEMPIC, 2 MG/DOSE,) 8 MG/3ML SOPN Inject 2 mg into the skin once a week. 08/05/22   [name], MD  senna (SENOKOT) 8.6 MG TABS tablet Take 2 tablets (17.2 mg total) by mouth 2 (two) times daily. 08/11/22   Morene Crocker, MD  SUMAtriptan (IMITREX) 50 MG tablet Take 1 tablet (50 mg total) by mouth every 2 (two) hours as needed for up to 10 doses for migraine. May  repeat in 2 hours if headache persists or recurs. 08/15/22   [name], MD  tiZANidine (ZANAFLEX) 4 MG tablet Take 1 tablet (4 mg total) by mouth every 8 (eight) hours as needed for muscle spasms. 09/04/22   [name], MD  torsemide (DEMADEX) 20 MG tablet Take 2 tablets (40 mg total) by mouth 2 (two) times daily. 08/05/22   [name], MD  traMADol (ULTRAM) 50 MG tablet Take 1 tablet (50 mg total) by mouth every 4 (four) hours as needed. 09/04/22   [name], MD      Allergies    Lisinopril    Review of Systems   Review of Systems  Physical Exam Updated Vital Signs BP 121/80 (BP Location: Left Arm)   Pulse 84   Temp 98.3 F (36.8 C) (Oral)  Resp 19   SpO2 100%  Physical Exam Musculoskeletal:     Comments: Pt has tenderness over the lumbar region No step offs, no erythema. Pt has 1+ patellar reflex bilaterally. Subjective numbness over both legs, but able to appreciate painful stimuli      ED Results / Procedures / Treatments   Labs (all labs ordered are listed, but only abnormal results are displayed) Labs Reviewed  URINALYSIS, W/ REFLEX TO CULTURE (INFECTION SUSPECTED) - Abnormal; Notable for the following components:      Result Value   Bacteria, UA RARE (*)    All other components within normal limits    EKG None  Radiology DG Lumbar Spine Complete  Result Date: 09/17/2022 CLINICAL DATA:  Status post fall with thoracic and lumbar back pain associated with paresthesia in the legs EXAM: LUMBAR SPINE - COMPLETE 5 VIEW; THORACIC SPINE 2 VIEWS COMPARISON:  CT abdomen and pelvis dated 08/10/2022 FINDINGS: There is no evidence of thoracic or lumbar spine fracture. Mild dextroconvex curvature of the thoracic spine. Thoracic kyphosis and lumbar lordosis are preserved. Mild intervertebral disc space narrowing at C5-C7. IMPRESSION: 1. No evidence of thoracic or lumbar spine fracture. 2. Mild degenerative changes of the lower cervical spine.  Electronically Signed   By: Agustin Cree M.D.   On: 09/17/2022 11:20   DG Thoracic Spine 2 View  Result Date: 09/17/2022 CLINICAL DATA:  Status post fall with thoracic and lumbar back pain associated with paresthesia in the legs EXAM: LUMBAR SPINE - COMPLETE 5 VIEW; THORACIC SPINE 2 VIEWS COMPARISON:  CT abdomen and pelvis dated 08/10/2022 FINDINGS: There is no evidence of thoracic or lumbar spine fracture. Mild dextroconvex curvature of the thoracic spine. Thoracic kyphosis and lumbar lordosis are preserved. Mild intervertebral disc space narrowing at C5-C7. IMPRESSION: 1. No evidence of thoracic or lumbar spine fracture. 2. Mild degenerative changes of the lower cervical spine. Electronically Signed   By: Agustin Cree M.D.   On: 09/17/2022 11:20    Procedures Procedures    Medications Ordered in ED Medications  oxyCODONE-acetaminophen (PERCOCET/ROXICET) 5-325 MG per tablet 1 tablet (has no administration in time range)  methocarbamol (ROBAXIN) tablet 500 mg (has no administration in time range)  oxyCODONE-acetaminophen (PERCOCET/ROXICET) 5-325 MG per tablet 1 tablet (1 tablet Oral Given 09/17/22 1052)  naproxen (NAPROSYN) tablet 500 mg (500 mg Oral Given 09/17/22 1052)    ED Course/ Medical Decision Making/ A&P                                 Medical Decision Making Amount and/or Complexity of Data Reviewed Radiology: ordered.  Risk OTC drugs. Prescription drug management.   Differential diagnosis includes: - DJD of the back - Spondylitises/ spondylosis - Sciatica - Spinal cord compression - Conus medullaris - Epidural hematoma - Epidural abscess - Lytic/pathologic fracture - Myelitis - Musculoskeletal pain   ASSESSMENT:  lumbar strain, contusion, questionable new herniated disc No red flags suggesting cord compression or cauda equina  PLAN: For acute pain, rest, intermittent application of cold packs (later, may switch to heat, but do not sleep on heating pad), analgesics  and muscle relaxants are recommended. Discussed longer term treatment plan of prn NSAID's and discussed a home back care exercise program with flexion exercise routine. Proper lifting with avoidance of heavy lifting discussed. Consider Physical Therapy and XRay studies if not improving by seeing PCP.   Return precautions discussed.  Patient will come  back to the emergency room if her symptoms get worse or she starts developing new neurologic symptoms.    Final Clinical Impression(s) / ED Diagnoses Final diagnoses:  Fall, initial encounter  Strain of lumbar region, initial encounter    Rx / DC Orders ED Discharge Orders          Ordered    naproxen (NAPROSYN) 500 MG tablet  2 times daily        09/17/22 1330    acetaminophen (TYLENOL) 500 MG tablet  Every 6 hours PRN        09/17/22 1330    methocarbamol (ROBAXIN) 500 MG tablet  2 times daily        09/17/22 1330    oxyCODONE-acetaminophen (PERCOCET/ROXICET) 5-325 MG tablet  Every 8 hours PRN        09/17/22 1330              Derwood Kaplan, MD 09/17/22 1338

## 2022-09-17 NOTE — ED Notes (Signed)
PT ambulated without assistance to bathroom and back. She states the pain is not better. She was walking slower than normal but did well.

## 2022-09-17 NOTE — ED Triage Notes (Signed)
Pt states that she was getting out of the shower this morning and lost her balance, falling on the tile floor. Pt c/o thoracic and lumbar pain with paresthesias in her legs. Pt denies head injury/LOC. No blood thinners per pt. Pt also reports recent fall earlier in the month for similar issue.

## 2022-09-17 NOTE — Discharge Instructions (Signed)
We saw you in the ER for back pain. Fortunately, no evidence of fracture seen on the x-ray.  Often the pain is self limiting, you just need time and supportive medications that are prescribed. Take the muscle relaxant as needed, and see your primary care doctor for further management if the symptoms continue to linger.   Please use the back exercises to strengthen the back muscles and be very careful with activities in future to prevent similar painful events.  Please return to the ER if your pain becomes excruciating or you start developing new numbness, weakness, urinary incontinence (peeing on self without warning), urinary retention (not able to pee despite bladder feeling full), inability to defecate, pins and needles sensation by your ano-genital area.

## 2022-09-17 NOTE — ED Notes (Signed)
Patient transported to X-ray 

## 2022-09-18 ENCOUNTER — Other Ambulatory Visit: Payer: Self-pay | Admitting: Student

## 2022-09-18 DIAGNOSIS — A084 Viral intestinal infection, unspecified: Secondary | ICD-10-CM

## 2022-09-19 ENCOUNTER — Ambulatory Visit: Payer: 59 | Admitting: Physician Assistant

## 2022-09-19 ENCOUNTER — Ambulatory Visit (HOSPITAL_COMMUNITY): Payer: 59 | Admitting: Licensed Clinical Social Worker

## 2022-09-22 ENCOUNTER — Telehealth: Payer: Self-pay | Admitting: Student

## 2022-09-22 ENCOUNTER — Other Ambulatory Visit: Payer: Self-pay | Admitting: Internal Medicine

## 2022-09-22 DIAGNOSIS — R0982 Postnasal drip: Secondary | ICD-10-CM

## 2022-09-22 MED ORDER — METHOCARBAMOL 500 MG PO TABS: 500.0000 mg | ORAL_TABLET | Freq: Three times a day (TID) | ORAL | 0 refills | Status: DC | PRN

## 2022-09-22 NOTE — Telephone Encounter (Signed)
Received page from on-call provider for this patient.  Patient reports that 5 days ago, she had a fall in the grocery store.  Since she has been having some back pain.  She had went to the emergency department to be evaluated.  Patient discharged with Tylenol, naproxen, ibuprofen, and Robaxin.  She states she has ran out of the Robaxin.  She states that she would like some pain medicine to help hold her over until she sees her primary care physician which are scheduled for 09/25/2022.  I stated that I probably would not give her any opioids, but would agree to give her some muscle relaxers.  She states that would help her.  I also gave her information about symptoms to look out for such as saddle anesthesia, bowel incontinence, bladder incontinence.  Patient understood that she would have to go to the emergency department if she develop any new symptoms.  Will send in for Robaxin for the next 4 days until patient can get to her appointment with Dr. Antony Contras.

## 2022-09-23 NOTE — Telephone Encounter (Signed)
Next appt scheduled 8/28 with PCP. 

## 2022-09-24 ENCOUNTER — Encounter (HOSPITAL_COMMUNITY): Payer: Self-pay | Admitting: Psychiatry

## 2022-09-24 ENCOUNTER — Telehealth (HOSPITAL_BASED_OUTPATIENT_CLINIC_OR_DEPARTMENT_OTHER): Payer: 59 | Admitting: Psychiatry

## 2022-09-24 VITALS — Wt 314.0 lb

## 2022-09-24 DIAGNOSIS — F331 Major depressive disorder, recurrent, moderate: Secondary | ICD-10-CM | POA: Diagnosis not present

## 2022-09-24 DIAGNOSIS — F4312 Post-traumatic stress disorder, chronic: Secondary | ICD-10-CM | POA: Diagnosis not present

## 2022-09-24 MED ORDER — LAMOTRIGINE 100 MG PO TABS
100.0000 mg | ORAL_TABLET | Freq: Every day | ORAL | 2 refills | Status: DC
Start: 2022-09-24 — End: 2022-12-24

## 2022-09-24 MED ORDER — REXULTI 3 MG PO TABS
3.0000 mg | ORAL_TABLET | Freq: Every day | ORAL | 2 refills | Status: DC
Start: 2022-09-24 — End: 2022-12-24

## 2022-09-24 MED ORDER — DOXEPIN HCL 25 MG PO CAPS
25.0000 mg | ORAL_CAPSULE | Freq: Every day | ORAL | 2 refills | Status: DC
Start: 2022-09-24 — End: 2022-12-24

## 2022-09-24 NOTE — Progress Notes (Signed)
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 that she need to take care of her medication and not keep losing.  Patient promised that she will take extra precaution to have her medication all the time to herself.  Recommend to call us back if she is any question or any concern.  Follow-up in 3 months.  She used to take multiple medication but gradually it has been reduced due to polypharmacy.   Follow Up Instructions:     I discussed the assessment and treatment plan with the patient. The patient was provided an opportunity to ask questions and all were answered. The patient agreed with the plan and demonstrated an understanding of the instructions.    The patient was advised to call back or seek an in-person evaluation if the symptoms worsen or if the condition fails to improve as anticipated.    Collaboration of Care: Other provider involved in patient's care AEB notes are available in epic to review.  Patient/Guardian was advised Release of Information must be obtained prior to any record release in order to collaborate their care with an outside provider. Patient/Guardian was advised if they have not already done so to contact the registration department to sign all necessary forms in order for [name] to release information regarding their care.   Consent: Patient/Guardian gives verbal consent for treatment and assignment of benefits for services provided during this visit. Patient/Guardian expressed  understanding and agreed to proceed.     I provided 31 minutes of non face to face time during this encounter.  [name]: This document was prepared by [employer] voice dictation technology and any errors that results from this process are unintentional.    Cleotis Nipper, MD 09/24/2022

## 2022-09-25 ENCOUNTER — Encounter: Payer: Self-pay | Admitting: Internal Medicine

## 2022-09-25 ENCOUNTER — Ambulatory Visit (INDEPENDENT_AMBULATORY_CARE_PROVIDER_SITE_OTHER): Payer: 59 | Admitting: Internal Medicine

## 2022-09-25 DIAGNOSIS — F1190 Opioid use, unspecified, uncomplicated: Secondary | ICD-10-CM | POA: Diagnosis not present

## 2022-09-25 DIAGNOSIS — L732 Hidradenitis suppurativa: Secondary | ICD-10-CM | POA: Diagnosis not present

## 2022-09-25 DIAGNOSIS — M797 Fibromyalgia: Secondary | ICD-10-CM | POA: Diagnosis not present

## 2022-09-25 MED ORDER — MELOXICAM 15 MG PO TABS: 15.0000 mg | ORAL_TABLET | Freq: Every day | ORAL | 2 refills | Status: DC

## 2022-09-25 NOTE — Assessment & Plan Note (Signed)
Refilled meloxicam. D/c naproxen.

## 2022-09-25 NOTE — Assessment & Plan Note (Signed)
I worry that her worsening falls, fevers, abdominal pain may all be related to her tramadol use. She is taking a high dose of 50 mg q4hrs. She has been seen in the ED multiple times for fall. I also worry about it's serotonergic effect with her on going fevers. She recently violated our pain medication contract by filling an Rx from an ED provider. I informed her of this. I have made a unilateral decision to taper her tramadol, ideally to taper off but at least taper down to a safer dose. I have instructed her to decrease this to q6hrs. She just filled on 8/22. If we start the q6h dosing tomorrow, this should last her until 10/1 which will be her next fill date.  -- Decrease tramaol 50 mg q4h --> q6h PRN -- Current Rx should last until 10/1 with this dose change -- Follow up in 1 week to see how she is doing

## 2022-09-25 NOTE — Progress Notes (Signed)
    This is a telephone encounter between [name] and [name] on 09/25/2022 for ED follow up. The visit was conducted with the patient located at home and [name] at [facility]. The patient's identity was confirmed using their DOB and current address. The patient has consented to being evaluated through a telephone encounter and understands the associated risks (an examination cannot be done and the patient may need to come in for an appointment) / benefits (allows the patient to remain at home, decreasing exposure to coronavirus). I personally spent 15 minutes on medical discussion.   CC: Fall, ED follow up  HPI:  [name] is a 52 y.o. female with past medical history outlined below. Patient was contacted for a telehealth appointment for ED follow up. Patient reports she has been struggling with painful neuropathy in her bilateral lower extremities. The feeling starts in her toes and radiates to the top of both knees. She decided to present to the ED for this, but as she was getting ready to leave she had a fall and fell backwards onto her bottom. She has had many falls over the past few months with multiple / recurrent ED visits. She continues to have abdominal pain and low grade fevers. She was given a prescription for percocet from the ED physician which she filled and is showing on her PDMP. I explained to her that this is a violation of our controlled substance contract. I worry about her fall risk and I am still concerned about the serotenergic effects of tramadol (domunented in my last encounter note).  She also has a history of hidradenitis and reports an "abscess" on her lower back / buttocks. She has been applying clindamycin cream but feels it is getting worse, asking about an oral antibiotic.  Also requesting refill of meloxicam.    Assessment & Plan:   Hidradenitis Needs in person OV to assess disease severity. Instructed her to follow up in 1 week or at her  convenience.   Fibromyalgia Refilled meloxicam. D/c naproxen.   Chronic, continuous use of opioids I worry that her worsening falls, fevers, abdominal pain may all be related to her tramadol use. She is taking a high dose of 50 mg q4hrs. She has been seen in the ED multiple times for fall. I also worry about it's serotonergic effect with her on going fevers. She recently violated our pain medication contract by filling an Rx from an ED provider. I informed her of this. I have made a unilateral decision to taper her tramadol, ideally to taper off but at least taper down to a safer dose. I have instructed her to decrease this to q6hrs. She just filled on 8/22. If we start the q6h dosing tomorrow, this should last her until 10/1 which will be her next fill date.  -- Decrease tramaol 50 mg q4h --> q6h PRN -- Current Rx should last until 10/1 with this dose change -- Follow up in 1 week to see how she is doing

## 2022-09-25 NOTE — Assessment & Plan Note (Signed)
Needs in person OV to assess disease severity. Instructed her to follow up in 1 week or at her convenience.

## 2022-10-01 ENCOUNTER — Encounter: Payer: Self-pay | Admitting: Pharmacist

## 2022-10-01 ENCOUNTER — Telehealth: Payer: Self-pay | Admitting: *Deleted

## 2022-10-01 NOTE — Telephone Encounter (Signed)
Call from patient states is cancelling appointment with Dr. Antony Contras for tomorrow.  States that the Hidradenitis has stopped draining and has transportation problems.  Will however do AWV  tomorrow.

## 2022-10-02 ENCOUNTER — Encounter: Payer: 59 | Admitting: Internal Medicine

## 2022-10-02 ENCOUNTER — Ambulatory Visit (INDEPENDENT_AMBULATORY_CARE_PROVIDER_SITE_OTHER): Payer: 59

## 2022-10-02 ENCOUNTER — Telehealth: Payer: Self-pay

## 2022-10-02 VITALS — Ht 70.0 in | Wt 314.0 lb

## 2022-10-02 DIAGNOSIS — Z0000 Encounter for general adult medical examination without abnormal findings: Secondary | ICD-10-CM

## 2022-10-02 DIAGNOSIS — Z5982 Transportation insecurity: Secondary | ICD-10-CM

## 2022-10-02 NOTE — Patient Instructions (Addendum)
Ms. Driskill , Thank you for taking time to come for your Medicare Wellness Visit. I appreciate your ongoing commitment to your health goals. Please review the following plan we discussed and let me know if I can assist you in the future.   Referrals/Orders/Follow-Ups/Clinician Recommendations: You are due for a Tetanus, (I looked in the [facility] registry and did not see that you have had one) and the Shingrix vaccine.  You can get these done at your local pharmacy.  Also remember to get scheduled for your mammogram before the year is out.  I am sending a note to your PCP in regards of transportation, you may get a call about that soon.  Get well soon and it was nice to speak to you today.  This is a list of the screening recommended for you and due dates:  Health Maintenance  Topic Date Due   DTaP/Tdap/Td vaccine (1 - Tdap) Never done   Zoster (Shingles) Vaccine (1 of 2) Never done   Eye exam for diabetics  09/08/2019   Mammogram  01/24/2021   Flu Shot  08/29/2022   COVID-19 Vaccine (4 - 2023-24 season) 09/29/2022   Hemoglobin A1C  01/09/2023   Yearly kidney health urinalysis for diabetes  01/10/2023   Complete foot exam   01/10/2023   Yearly kidney function blood test for diabetes  08/19/2023   Medicare Annual Wellness Visit  10/02/2023   Pap Smear  06/09/2025   Colon Cancer Screening  12/30/2028   Hepatitis C Screening  Completed   HIV Screening  Completed   HPV Vaccine  Aged Out    Advanced directives: (Copy Requested) Please bring a copy of your health care power of attorney and living will to the office to be added to your chart at your convenience.  Next Medicare Annual Wellness Visit scheduled for next year: Yes

## 2022-10-02 NOTE — Progress Notes (Signed)
Subjective:   Lindsay [name] is a 52 y.o. female who presents for Medicare Annual (Subsequent) preventive examination.  Visit Complete: Virtual  I connected with  [name] on 10/02/22 by a audio enabled telemedicine application and verified that I am speaking with the correct person using two identifiers.  Patient Location: Home  Provider Location: Home Office  I discussed the limitations of evaluation and management by telemedicine. The patient expressed understanding and agreed to proceed.  Vital Signs: Because this visit was a virtual/telehealth visit, some criteria may be missing or patient reported. Any vitals not documented were not able to be obtained and vitals that have been documented are patient reported.    Review of Systems   Cardiac Risk Factors include: advanced age (>80men, >57 women);hypertension;diabetes mellitus;Other (see comment), Risk factor comments: COPD, OSA     Objective:    Today's Vitals   10/02/22 0817 10/02/22 0818  Weight: (!) 314 lb (142.4 kg)   Height: 5\' 10"  (1.778 m)   PainSc:  9    Body mass index is 45.05 kg/m.     10/02/2022    8:24 AM 09/25/2022   10:29 AM 09/17/2022    8:53 AM 09/04/2022    8:27 AM 08/19/2022    1:21 PM 08/10/2022    6:00 AM 08/09/2022    8:14 PM  Advanced Directives  Does Patient Have a Medical Advance Directive? Yes No No No No No No  Type of Estate agent of [place];Living will        Copy of Healthcare Power of Attorney in Chart? No - copy requested        Would patient like information on creating a medical advance directive?  No - Patient declined  No - Patient declined No - Patient declined No - Patient declined     Current Medications (verified) Outpatient Encounter Medications as of 10/02/2022  Medication Sig   ACCU-CHEK GUIDE test strip USE ONE TEST STRIP TO CHECK BLOOD SUGARS ONCE A DAY AS NEEDED   Accu-Chek Softclix Lancets lancets USE ONE LANCET TO CHECK BLOOD SUGAR ONE A DAY AS  NEEDED   acetaminophen (TYLENOL) 500 MG tablet Take 1 tablet (500 mg total) by mouth every 6 (six) hours as needed.   albuterol (PROVENTIL) (2.5 MG/3ML) 0.083% nebulizer solution TAKE 3 ML (2.5 MG TOTAL) BY NEBULIZATION EVERY 4 HOURS AS NEEDED FOR WHEEZING OR SHORTNESS OF BREATH   albuterol (VENTOLIN HFA) 108 (90 Base) MCG/ACT inhaler TAKE 2 PUFFS BY MOUTH EVERY 6 HOURS AS NEEDED FOR WHEEZE OR SHORTNESS OF BREATH   atorvastatin (LIPITOR) 40 MG tablet TAKE 1 TABLET BY MOUTH EVERY DAY   azelastine (ASTELIN) 0.1 % nasal spray Place 1 spray into both nostrils 2 (two) times daily. Use in each nostril as directed   Blood Glucose Monitoring Suppl (ACCU-CHEK GUIDE ME) w/Device KIT Dispense one device   Brexpiprazole (REXULTI) 3 MG TABS Take 1 tablet (3 mg total) by mouth daily.   cetirizine (ZYRTEC) 10 MG tablet Take 1 tablet (10 mg total) by mouth daily.   clindamycin (CLINDAGEL) 1 % gel APPLY TO AFFECTED AREA TWICE A DAY   [facility] D3 25 MCG (1000 UT) capsule TAKE 1 CAPSULE BY MOUTH EVERY DAY   dicyclomine (BENTYL) 10 MG capsule Take 2 capsules (20 mg total) by mouth 3 (three) times daily as needed for spasms (abdominal pain).   doxepin (SINEQUAN) 25 MG capsule Take 1 capsule (25 mg total) by mouth at bedtime.   famotidine (  PEPCID) 20 MG tablet Take 1 tablet (20 mg total) by mouth daily.   fluticasone (FLONASE) 50 MCG/ACT nasal spray SPRAY 1 SPRAY INTO EACH NOSTRIL DAILY   ipratropium (ATROVENT) 0.06 % nasal spray PLEASE SEE ATTACHED FOR DETAILED DIRECTIONS   lamoTRIgine (LAMICTAL) 100 MG tablet Take 1 tablet (100 mg total) by mouth daily.   meclizine (ANTIVERT) 25 MG tablet Take 1 tablet (25 mg total) by mouth 3 (three) times daily as needed for dizziness.   megestrol (MEGACE) 40 MG tablet Take 2 tablets (80 mg total) by mouth daily.   meloxicam (MOBIC) 15 MG tablet Take 1 tablet (15 mg total) by mouth daily.   metoprolol succinate (TOPROL-XL) 50 MG 24 hr tablet Take 1 tablet (50 mg total) by mouth daily.    omeprazole (PRILOSEC) 40 MG capsule Take 40 mg by mouth daily.   ondansetron (ZOFRAN-ODT) 8 MG disintegrating tablet TAKE 1 TABLET BY MOUTH EVERY 8 HOURS AS NEEDED FOR NAUSEA OR VOMITING.   polyethylene glycol (MIRALAX / GLYCOLAX) 17 g packet Take 17 g by mouth daily.   Semaglutide, 2 MG/DOSE, (OZEMPIC, 2 MG/DOSE,) 8 MG/3ML SOPN Inject 2 mg into the skin once a week.   senna (SENOKOT) 8.6 MG TABS tablet Take 2 tablets (17.2 mg total) by mouth 2 (two) times daily.   SUMAtriptan (IMITREX) 50 MG tablet Take 1 tablet (50 mg total) by mouth every 2 (two) hours as needed for up to 10 doses for migraine. May repeat in 2 hours if headache persists or recurs.   tiZANidine (ZANAFLEX) 4 MG tablet Take 1 tablet (4 mg total) by mouth every 8 (eight) hours as needed for muscle spasms.   torsemide (DEMADEX) 20 MG tablet Take 2 tablets (40 mg total) by mouth 2 (two) times daily.   traMADol (ULTRAM) 50 MG tablet Take 1 tablet (50 mg total) by mouth every 4 (four) hours as needed.   ferrous sulfate 325 (65 FE) MG EC tablet Take 1 tablet (325 mg total) by mouth daily with breakfast. (Patient taking differently: Take 325 mg by mouth every other day.)   prochlorperazine (COMPAZINE) 5 MG tablet Take 1 tablet (5 mg total) by mouth every 8 (eight) hours as needed for up to 2 days for nausea or vomiting.   No facility-administered encounter medications on file as of 10/02/2022.    Allergies (verified) Lisinopril   History: Past Medical History:  Diagnosis Date   Acid reflux    Anemia    Iron Def   Anorexia    CHF (congestive heart failure) (HCC)    Chronic kidney disease    Nephrotic syndrome   Colon polyp 2009   Depression with anxiety    Edema leg    Fibroid tumor 04/2022   Fibromyalgia    Hemorrhoids    Hidradenitis suppurativa    Hypertension    IBS (irritable bowel syndrome)    Low back pain    Migraines    Morbidly obese (HCC)    Neuromuscular disorder (HCC)    fibromyalgia   Neuropathy     Panic attacks    Polyp of vocal cord or larynx    Stroke (HCC)    Tonsil pain    Past Surgical History:  Procedure Laterality Date   AXILLARY HIDRADENITIS EXCISION     COLONOSCOPY WITH PROPOFOL N/A 05/25/2015   Procedure: COLONOSCOPY WITH PROPOFOL;  Surgeon: Rachael Fee, MD;  Location: [facility];  Service: Endoscopy;  Laterality: N/A;   COLONOSCOPY WITH PROPOFOL N/A 12/31/2018  Procedure: COLONOSCOPY WITH PROPOFOL;  Surgeon: Rachael Fee, MD;  Location: [facility];  Service: Endoscopy;  Laterality: N/A;   HEMORRHOID SURGERY     with Hidradenitis surgery    INGUINAL HIDRADENITIS EXCISION     TONSILLECTOMY  10/18/2010   by Dr. Annalee Genta   UPPER GASTROINTESTINAL ENDOSCOPY     Family History  Problem Relation Age of Onset   Diabetes Mother    Heart disease Mother        valve leak   Anxiety disorder Mother    Depression Mother    High blood pressure Mother    Kidney failure Mother    Cancer Father        prostate   Heart disease Father    Learning disabilities Sister    Depression Sister    Depression Sister    Anxiety disorder Sister    Colon cancer Neg Hx    Social History   Socioeconomic History   Marital status: Single    Spouse name: Not on file   Number of children: 1   Years of education: 11   Highest education level: Not on file  Occupational History   Occupation: Disabled  Tobacco Use   Smoking status: Former    Current packs/day: 0.00    Average packs/day: 0.1 packs/day for 25.0 years (2.5 ttl pk-yrs)    Types: Cigarettes    Start date: 05/24/1994    Quit date: 05/24/2019    Years since quitting: 3.3   Smokeless tobacco: Never   Tobacco comments:    quit in April.  Vaping Use   Vaping status: Never Used  Substance and Sexual Activity   Alcohol use: Not Currently    Alcohol/week: 0.0 standard drinks of alcohol    Comment: none sine 05/2018   Drug use: Not Currently    Frequency: 3.0 times per week    Types: Marijuana   Sexual activity:  Yes    Birth control/protection: Implant    Comment: placed in March 2024  Other Topics Concern   Not on file  Social History Narrative   Lives at home w/ her mother and daughter   Right-handed   Caffeine: about 3 Cokes per week   Social Determinants of Health   Financial Resource Strain: High Risk (10/02/2022)   Overall Financial Resource Strain (CARDIA)    Difficulty of Paying Living Expenses: Very hard  Food Insecurity: Food Insecurity Present (10/02/2022)   Hunger Vital Sign    Worried About Running Out of Food in the Last Year: Often true    Ran Out of Food in the Last Year: Often true  Transportation Needs: Unmet Transportation Needs (10/02/2022)   PRAPARE - Transportation    Lack of Transportation (Medical): Yes    Lack of Transportation (Non-Medical): Yes  Physical Activity: Sufficiently Active (10/02/2022)   Exercise Vital Sign    Days of Exercise per Week: 7 days    Minutes of Exercise per Session: 30 min  Stress: Stress Concern Present (10/02/2022)   [employer] of Occupational Health - Occupational Stress Questionnaire    Feeling of Stress : To some extent  Social Connections: Moderately Isolated (10/02/2022)   Social Connection and Isolation Panel [NHANES]    Frequency of Communication with Friends and Family: More than three times a week    Frequency of Social Gatherings with Friends and Family: Never    Attends Religious Services: More than 4 times per year    Active Member of Clubs or Organizations: No  Attends Banker Meetings: Never    Marital Status: Never married    Tobacco Counseling Counseling given: Not Answered Tobacco comments: quit in April.   Clinical Intake:  Pre-visit preparation completed: Yes  Pain Score: 9  Pain Type: Acute pain Pain Location: Back Pain Orientation: Lower Pain Radiating Towards: from her feet to her thighs Pain Descriptors / Indicators: Numbness, Tingling Pain Onset: More than a month ago Pain Frequency:  Constant Pain Relieving Factors: Tramadol  Pain Relieving Factors: Tramadol  BMI - recorded: 45.1 Nutritional Status: BMI > 30  Obese Nutritional Risks: None Diabetes: Yes CBG done?: No CBG resulted in Enter/ Edit results?: No Did pt. bring in CBG monitor from home?: No  How often do you need to have someone help you when you read instructions, pamphlets, or other written materials from your doctor or pharmacy?: 1 - Never  Interpreter Needed?: No  Information entered by :: Rehanna Oloughlin, RMA   Activities of Daily Living    10/02/2022    8:22 AM 09/25/2022   10:28 AM  In your present state of health, do you have any difficulty performing the following activities:  Hearing? 0 0  Vision? 0 0  Difficulty concentrating or making decisions? 0 0  Walking or climbing stairs? 1 1  Dressing or bathing? 1 1  Doing errands, shopping? 1 1  Preparing Food and eating ? N   Using the Toilet? N   In the past six months, have you accidently leaked urine? N   Do you have problems with loss of bowel control? N   Managing your Medications? N   Managing your Finances? N   Housekeeping or managing your Housekeeping? N     Patient Care Team: [name], MD as PCP - General (Internal Medicine) Rennis Golden Lisette Abu, MD as PCP - Cardiology (Cardiology)  Indicate any recent Medical Services you may have received from other than [facility] providers in the past year (date may be approximate).     Assessment:   This is a routine wellness examination for Hendel.  Hearing/Vision screen Hearing Screening - Comments:: Denies hearing difficulties   Vision Screening - Comments:: Wears eyeglasses  Dietary issues and exercise activities discussed:     Goals Addressed               This Visit's Progress     Patient Stated (pt-stated)        She would like to lose some weight      Depression Screen    10/02/2022    8:30 AM 09/25/2022   10:28 AM 09/04/2022    8:30 AM 08/07/2022   10:11 AM  08/07/2022    9:44 AM 07/10/2022    9:16 AM 06/17/2022    9:16 AM  PHQ 2/9 Scores  PHQ - 2 Score 2 2  2 2 2 2   PHQ- 9 Score 3 12  12  8 8   Exception Documentation   Other- indicate reason in comment box      Not completed   Pt is having severe pain at this time.        Fall Risk    10/02/2022    8:25 AM 09/25/2022   10:28 AM 09/04/2022    8:26 AM 08/07/2022    9:31 AM 07/10/2022    9:15 AM  Fall Risk   Falls in the past year? 1 1 1 1 1   Number falls in past yr: 1 1 1 1 1   Injury with Fall?  1 1 1 1 1   Comment ER, strained back      Risk for fall due to :  History of fall(s);Impaired balance/gait;Impaired mobility History of fall(s);Impaired balance/gait;Impaired mobility History of fall(s);Impaired balance/gait;Impaired mobility History of fall(s);Impaired balance/gait;Impaired mobility  Follow up Falls evaluation completed;Falls prevention discussed  Falls evaluation completed;Falls prevention discussed Falls evaluation completed;Falls prevention discussed Falls evaluation completed;Falls prevention discussed    MEDICARE RISK AT HOME: Medicare Risk at Home Any stairs in or around the home?: Yes (2 steps in backyard) If so, are there any without handrails?: Yes Home free of loose throw rugs in walkways, pet beds, electrical cords, etc?: Yes Adequate lighting in your home to reduce risk of falls?: Yes Life alert?: No Use of a cane, walker or w/c?: No Grab bars in the bathroom?: No Shower chair or bench in shower?: No Elevated toilet seat or a handicapped toilet?: No  TIMED UP AND GO:  Was the test performed?  No    Cognitive Function:        10/02/2022    8:27 AM 08/14/2021   10:54 AM  6CIT Screen  What Year? 0 points 0 points  What month? 0 points 0 points  What time? 0 points 0 points  Count back from 20 0 points 0 points  Months in reverse 0 points 0 points  Repeat phrase 0 points 0 points  Total Score 0 points 0 points    Immunizations Immunization History   Administered Date(s) Administered   Influenza Split 11/27/2010   Influenza Whole 11/23/2008   Influenza,inj,Quad PF,6+ Mos 10/20/2014, 10/09/2015, 11/04/2016, 11/09/2017, 11/18/2018, 11/26/2019, 11/08/2020, 01/09/2022   PFIZER(Purple Top)SARS-COV-2 Vaccination 04/17/2019, 05/08/2019, 03/28/2020   Pneumococcal Polysaccharide-23 08/12/2018, 11/26/2019    TDAP status: Due, Education has been provided regarding the importance of this vaccine. Advised may receive this vaccine at local pharmacy or Health Dept. Aware to provide a copy of the vaccination record if obtained from local pharmacy or Health Dept. Verbalized acceptance and understanding.  Flu Vaccine status: Due, Education has been provided regarding the importance of this vaccine. Advised may receive this vaccine at local pharmacy or Health Dept. Aware to provide a copy of the vaccination record if obtained from local pharmacy or Health Dept. Verbalized acceptance and understanding.  Pneumococcal vaccine status: Up to date  Covid-19 vaccine status: Information provided on how to obtain vaccines.   Qualifies for Shingles Vaccine? Yes   Zostavax completed No   Shingrix Completed?: No.    Education has been provided regarding the importance of this vaccine. Patient has been advised to call insurance company to determine out of pocket expense if they have not yet received this vaccine. Advised may also receive vaccine at local pharmacy or Health Dept. Verbalized acceptance and understanding.  Screening Tests Health Maintenance  Topic Date Due   DTaP/Tdap/Td (1 - Tdap) Never done   Zoster Vaccines- Shingrix (1 of 2) Never done   OPHTHALMOLOGY EXAM  09/08/2019   MAMMOGRAM  01/24/2021   INFLUENZA VACCINE  08/29/2022   COVID-19 Vaccine (4 - 2023-24 season) 09/29/2022   HEMOGLOBIN A1C  01/09/2023   Diabetic kidney evaluation - Urine ACR  01/10/2023   FOOT EXAM  01/10/2023   Diabetic kidney evaluation - eGFR measurement  08/19/2023    Medicare Annual Wellness (AWV)  10/02/2023   PAP SMEAR-Modifier  06/09/2025   Colonoscopy  12/30/2028   Hepatitis C Screening  Completed   HIV Screening  Completed   HPV VACCINES  Aged Out  Health Maintenance  Health Maintenance Due  Topic Date Due   DTaP/Tdap/Td (1 - Tdap) Never done   Zoster Vaccines- Shingrix (1 of 2) Never done   OPHTHALMOLOGY EXAM  09/08/2019   MAMMOGRAM  01/24/2021   INFLUENZA VACCINE  08/29/2022   COVID-19 Vaccine (4 - 2023-24 season) 09/29/2022    Colorectal cancer screening: Type of screening: Colonoscopy. Completed 12/31/2018. Repeat every 10 years  Mammogram status: Completed 01/25/2019. Repeat every year  Lung Cancer Screening: (Low Dose CT Chest recommended if Age 38-80 years, 20 pack-year currently smoking OR have quit w/in 15years.) does not qualify.   Lung Cancer Screening Referral: N/A  Additional Screening:  Hepatitis C Screening: does qualify; Completed 02/02/2020  Vision Screening: Recommended annual ophthalmology exams for early detection of glaucoma and other disorders of the eye. Is the patient up to date with their annual eye exam?  No  Who is the provider or what is the name of the office in which the patient attends annual eye exams? Dr. Dione Booze If pt is not established with a provider, would they like to be referred to a provider to establish care? No .   Dental Screening: Recommended annual dental exams for proper oral hygiene  Diabetic Foot Exam: Diabetic Foot Exam: Completed 09/25/2022  Community Resource Referral / Chronic Care Management: CRR required this visit?  Yes   CCM required this visit?  No     Plan:     I have personally reviewed and noted the following in the patient's chart:   Medical and social history Use of alcohol, tobacco or illicit drugs  Current medications and supplements including opioid prescriptions. Patient is currently taking opioid prescriptions. Information provided to patient regarding  non-opioid alternatives. Patient advised to discuss non-opioid treatment plan with their provider. Functional ability and status Nutritional status Physical activity Advanced directives List of other physicians Hospitalizations, surgeries, and ER visits in previous 12 months Vitals Screenings to include cognitive, depression, and falls Referrals and appointments  In addition, I have reviewed and discussed with patient certain preventive protocols, quality metrics, and best practice recommendations. A written personalized care plan for preventive services as well as general preventive health recommendations were provided to patient.     Jaziya Obarr L Abdulwahab Demelo, CMA   10/02/2022   After Visit Summary: (MyChart) Due to this being a telephonic visit, the after visit summary with patients personalized plan was offered to patient via MyChart   Nurse Notes: Patient is due for a Tdap and Shingrix vaccine.  Patient will soon be due for the Flu vaccine as well.  Patient stated that she will be going to get her Shingrix vaccine in the next week or so at her pharmacy.  She stated that she has had a Tdap around 2-3 years ago, but I do not see that one has been completed in the [place]  Patient is due for a mammogram and stated that she would get that appointment scheduled before December of this year.  She has an appointment on 10/17/22, to see Dr. Dione Booze for her annual eye examination.  Patient explained that she is in need of transportation.  I will send a TE to her PCP for a community resource referral.

## 2022-10-04 ENCOUNTER — Ambulatory Visit (INDEPENDENT_AMBULATORY_CARE_PROVIDER_SITE_OTHER): Payer: 59 | Admitting: Licensed Clinical Social Worker

## 2022-10-04 ENCOUNTER — Telehealth: Payer: Self-pay

## 2022-10-04 DIAGNOSIS — F4312 Post-traumatic stress disorder, chronic: Secondary | ICD-10-CM | POA: Diagnosis not present

## 2022-10-04 DIAGNOSIS — F411 Generalized anxiety disorder: Secondary | ICD-10-CM | POA: Diagnosis not present

## 2022-10-04 DIAGNOSIS — F331 Major depressive disorder, recurrent, moderate: Secondary | ICD-10-CM | POA: Diagnosis not present

## 2022-10-04 NOTE — Telephone Encounter (Signed)
Patient called she is requesting benzonatate  for her cough. Pharmacy: [id number] - [place], [place] - [address]

## 2022-10-04 NOTE — Progress Notes (Signed)
Virtual Visit via Video Note  I connected with Lindsay [name] on 10/04/22 at  8:00 AM EDT by a video enabled telemedicine application and verified that I am speaking with the correct person using two identifiers.  Location: Patient: home Provider: home office   I discussed the limitations of evaluation and management by telemedicine and the availability of in person appointments. The patient expressed understanding and agreed to proceed.   I discussed the assessment and treatment plan with the patient. The patient was provided an opportunity to ask questions and all were answered. The patient agreed with the plan and demonstrated an understanding of the instructions.   The patient was advised to call back or seek an in-person evaluation if the symptoms worsen or if the condition fails to improve as anticipated.  I provided 40 minutes of non-face-to-face time during this encounter.  THERAPIST PROGRESS NOTE  Session Time: 8:00 AM to 8:40 AM  Participation Level: Active  Behavioral Response: CasualAlertappropriate  Type of Therapy: Individual Therapy  Treatment Goals addressed: patient continue to utilize therapy for  supportive and strength-based interventions, provide treatment interventions in the context of patient continuing to seek help and cope with medical issues, anxiety, triggers, coping with mental health symptoms, trauma, utilize therapy as a way for patient to focus on working through current stressors that also helps to distract from pain  ProgressTowards Goals: Progressing-patient utilizes sessions to process through frustrations helps her with coping  Interventions: Solution Focused, Strength-based, Supportive, and Other: coping  Summary: Lindsay [name] is a 52 y.o. female who presents with tossing and turning whole body aching, stuffed up. Forearm and elbow hurting touch it and little balls doesn't know where from hurts bad. What talking to doctors they think she is  looking for pain pills only just solve the problem. Doctor cutting down on meds because fell but that is not why fell was squatting down.Therapist noted that seems opposite direction as we often talk about pain issues haven't been solved and now they want to decrease medicine. Going to store and get cold medicine head start before it gets worse. Therapist asked about fatigue from Dr. Lolly Mustache note patient said because hurting and can't get around. Didn't go to the beach couldn't find uncle that came first so cancelled trip. t. He has brain cancer. Talked about what he had said to patient patient said you  deal with cards dealt.  Therapist reminded patient also to go to doctors appointments reinforced that because that is the same message therapist wants to give to patient. He was in the hospital, then rehab and then went home. Nobody wants him to go home (he had gone missing) by himself says where want to be. Talks to him when answers the phone.  Therapist processed patient's thoughts and feelings in session one of the main topics is ongoing frustrations with medical issues and care.  Therapist providing supportive interventions as well as validation for patient's frustrations.  Assess therapy is an outlet for patient so helpful for her and dealing with different issues in her life different frustrations..   .   Suicidal/Homicidal: No  Plan: Return again in 2 weeks.2.Assess very helpful for patient to get supportive interventions process thoughts and feelings in session  Diagnosis: Major depressive disorder, recurrent, moderate, generalized anxiety disorder, chronic PTSD, panic attacks  Collaboration of Care: Medication Management AEB review of Dr.[name] last note  Patient/Guardian was advised Release of Information must be obtained prior to any record release in order to collaborate  their care with an outside provider. Patient/Guardian was advised if they have not already done so to contact the  registration department to sign all necessary forms in order for [name] to release information regarding their care.   Consent: Patient/Guardian gives verbal consent for treatment and assignment of benefits for services provided during this visit. Patient/Guardian expressed understanding and agreed to proceed.   Coolidge Breeze, LCSW 10/04/2022

## 2022-10-06 ENCOUNTER — Other Ambulatory Visit: Payer: Self-pay | Admitting: Internal Medicine

## 2022-10-06 ENCOUNTER — Telehealth: Payer: Self-pay | Admitting: Student

## 2022-10-06 NOTE — Telephone Encounter (Signed)
On-call pager paged for patient requesting to speak with provider.  Patient reported that she developed a cough, and would like benzonatate pills.  Per charting, her PCP who is Dr. Antony Contras who states that patient needs appointment before refilling this.  I told patient to make appointment tomorrow and patient agreed to this.  As a contingency plan, did also send a front desk message to office staff to call patient tomorrow to schedule appointment.

## 2022-10-06 NOTE — Telephone Encounter (Signed)
No, needs to be evaluated or at least have a telehealth appointment.

## 2022-10-06 NOTE — Progress Notes (Signed)
Internal Medicine Clinic Attending  Case, documentation, and findings reviewed. I agree with the assessment, diagnosis, and plan of care as outlined in the AWV note.     

## 2022-10-07 ENCOUNTER — Ambulatory Visit (INDEPENDENT_AMBULATORY_CARE_PROVIDER_SITE_OTHER): Payer: 59 | Admitting: Student

## 2022-10-07 DIAGNOSIS — R051 Acute cough: Secondary | ICD-10-CM | POA: Diagnosis not present

## 2022-10-07 MED ORDER — BENZONATATE 100 MG PO CAPS
100.0000 mg | ORAL_CAPSULE | Freq: Four times a day (QID) | ORAL | 1 refills | Status: DC | PRN
Start: 2022-10-07 — End: 2023-01-13

## 2022-10-07 NOTE — Patient Instructions (Addendum)
Thank you so much for coming to the clinic today!   I have put in an order for your Tessalon Perles.  Please follow-up with the clinic if your symptoms do not resolve by Friday.  If possible, test yourself for COVID in case we can begin treatment.  The last day we can begin treatment as tomorrow.  If your symptoms worsen and you develop shortness of breath, troubles breathing, or increase sputum production, please go to the ED.  If you have any questions please feel free to the call the clinic at anytime at [phone]. It was a pleasure seeing you!  Best, Dr. Rayvon Char

## 2022-10-07 NOTE — Assessment & Plan Note (Signed)
Patient notes developing cough on Friday morning.  She has been producing a cough clear phlegm, but this is her baseline.  She denies any increase in sputum production.  She denies any troubles breathing, chest pain, shortness of breath, fever, night sweats.  She is currently on 3 L of oxygen at home, but this is not changed since the development of her cough.  She has been using her breathing treatments which not been helpful.  She endorses intermittent nasal congestion but no other signs or symptoms.  We encouraged her to test for COVID as she is still in treatment window and is high risk.  We encouraged her to follow-up with the clinic if her symptoms do not resolve by Friday. Low concern for COPD exacerbation at the moment but noted warning signs of increased SOB, troubles breathing, or increased sputum production that will require escalation of care.  - Tessalon Perles as needed - Follow-up if symptoms not resolved by Friday - Test for COVID for possible treatment

## 2022-10-07 NOTE — Progress Notes (Signed)
   CC: Cough   This is a telephone encounter between Lindsay [name] and Lindsay [name] on 10/07/2022 for cough. The visit was conducted with the patient located at home and Lindsay [name] at [facility]. The patient's identity was confirmed using their DOB and current address. The patient has consented to being evaluated through a telephone encounter and understands the associated risks (an examination cannot be done and the patient may need to come in for an appointment) / benefits (allows the patient to remain at home, decreasing exposure to coronavirus). I personally spent 30 minutes on medical discussion.   HPI: [name] is a 52 y.o. with PMH as below.   Please see A&P for assessment of the patient's acute and chronic medical conditions.   Past Medical History:  Diagnosis Date   Acid reflux    Anemia    Iron Def   Anorexia    CHF (congestive heart failure) (HCC)    Chronic kidney disease    Nephrotic syndrome   Colon polyp 2009   Depression with anxiety    Edema leg    Fibroid tumor 04/2022   Fibromyalgia    Hemorrhoids    Hidradenitis suppurativa    Hypertension    IBS (irritable bowel syndrome)    Low back pain    Migraines    Morbidly obese (HCC)    Neuromuscular disorder (HCC)    fibromyalgia   Neuropathy    Panic attacks    Polyp of vocal cord or larynx    Stroke (HCC)    Tonsil pain    Review of Systems:  Negative unless otherwise specified  Assessment & Plan:   Cough Patient notes developing cough on Friday morning.  She has been producing a cough clear phlegm, but this is her baseline.  She denies any increase in sputum production.  She denies any troubles breathing, chest pain, shortness of breath, fever, night sweats.  She is currently on 3 L of oxygen at home, but this is not changed since the development of her cough.  She has been using her breathing treatments which not been helpful.  She endorses intermittent nasal congestion but no other signs or symptoms.   We encouraged her to test for COVID as she is still in treatment window and is high risk.  We encouraged her to follow-up with the clinic if her symptoms do not resolve by Friday. Low concern for COPD exacerbation at the moment but noted warning signs of increased SOB, troubles breathing, or increased sputum production that will require escalation of care.  - Tessalon Perles as needed - Follow-up if symptoms not resolved by Friday - Test for COVID for possible treatment    Patient seen with Dr. Mariea Stable, MD  Internal Medicine Resident

## 2022-10-08 NOTE — Progress Notes (Signed)
Internal Medicine Clinic Attending  I was physically present during the key portions of the resident provided service and participated in the medical decision making of patient's management care. I reviewed pertinent patient test results.  The assessment, diagnosis, and plan were formulated together and I agree with the documentation in the resident's note.  If no resolution within the next week, recommend in-person appointment for evaluation.  Dickie La, MD

## 2022-10-08 NOTE — Addendum Note (Signed)
Addended by: Dickie La on: 10/08/2022 03:02 PM   Modules accepted: Level of Service

## 2022-10-08 NOTE — Telephone Encounter (Signed)
Patient needs appointment to discuss; please have her reschedule.

## 2022-10-15 NOTE — Telephone Encounter (Signed)
Patient sent message via my chart to contact the clinic to schedule an appointment.

## 2022-10-16 ENCOUNTER — Other Ambulatory Visit: Payer: Self-pay | Admitting: Internal Medicine

## 2022-10-16 DIAGNOSIS — E119 Type 2 diabetes mellitus without complications: Secondary | ICD-10-CM

## 2022-10-17 DIAGNOSIS — H53002 Unspecified amblyopia, left eye: Secondary | ICD-10-CM | POA: Diagnosis not present

## 2022-10-17 DIAGNOSIS — H2513 Age-related nuclear cataract, bilateral: Secondary | ICD-10-CM | POA: Diagnosis not present

## 2022-10-17 DIAGNOSIS — E119 Type 2 diabetes mellitus without complications: Secondary | ICD-10-CM | POA: Diagnosis not present

## 2022-10-17 LAB — HM DIABETES EYE EXAM

## 2022-10-18 ENCOUNTER — Ambulatory Visit (INDEPENDENT_AMBULATORY_CARE_PROVIDER_SITE_OTHER): Payer: 59 | Admitting: Licensed Clinical Social Worker

## 2022-10-18 DIAGNOSIS — F410 Panic disorder [episodic paroxysmal anxiety] without agoraphobia: Secondary | ICD-10-CM

## 2022-10-18 DIAGNOSIS — F331 Major depressive disorder, recurrent, moderate: Secondary | ICD-10-CM

## 2022-10-18 DIAGNOSIS — F4312 Post-traumatic stress disorder, chronic: Secondary | ICD-10-CM | POA: Diagnosis not present

## 2022-10-18 DIAGNOSIS — F411 Generalized anxiety disorder: Secondary | ICD-10-CM | POA: Diagnosis not present

## 2022-10-18 NOTE — Progress Notes (Signed)
Virtual Visit via Video Note  I connected with Lindsay [name] on 10/18/22 at  8:00 AM EDT by a video enabled telemedicine application and verified that I am speaking with the correct person using two identifiers.  Location: Patient: home Provider: home office   I discussed the limitations of evaluation and management by telemedicine and the availability of in person appointments. The patient expressed understanding and agreed to proceed.   I discussed the assessment and treatment plan with the patient. The patient was provided an opportunity to ask questions and all were answered. The patient agreed with the plan and demonstrated an understanding of the instructions.   The patient was advised to call back or seek an in-person evaluation if the symptoms worsen or if the condition fails to improve as anticipated.  I provided 46 minutes of non-face-to-face time during this encounter.  THERAPIST PROGRESS NOTE  Session Time: 8:00 AM to 8:46 AM  Participation Level: Active  Behavioral Response: CasualAlertDysphoric  Type of Therapy: Individual Therapy  Treatment Goals addressed: patient continue to utilize therapy for  supportive and strength-based interventions, provide treatment interventions in the context of patient continuing to seek help and cope with medical issues, anxiety, triggers, coping with mental health symptoms, trauma, utilize therapy as a way for patient to focus on   ProgressTowards Goals: Progressing-noted patient's medical issues are what she is struggling with assess very therapeutic for have a place to have an outlet talk about thoughts and feelings and be provided with supportive interventions  Interventions: Solution Focused, Strength-based, Supportive, and Other: coping  Summary: Lindsay [name] is a 52 y.o. female who presents with knee is flared up really bad her left leg, her knee and back really bad today hurts in general but not like this. Eye doctor appointment  yesterday it was hard nobody there walking down the hallway she had force herself to do it. PCP get you a chair volunteer push you to where go somebody in office pushes waits for volunteer to take her back to parking lot. Pain issues nothing that is working takes Tramadol take it every six hours. Why cut back? Patient didn't know people at hospital wrote Percocet doctor said somebody from her office had to write it. Patient didn't know that wasn't told. Why this time why not as many times as she has been to the hospital since January. Doctor thinks the sweating could be withdrawal symptoms. Patient said not having withdrawal. Three people are consistent and one is her PCP would tell her not withdraw. Therapist noted ever since known patient haven't managed the pain has been suffering with this. Go to doctors to take care of it but never resolved. Wonders if go somewhere else lose her mental health providers. Has known this doctor 7-8 years. Going somewhere else would have to start someone else. Interview doctor see their approach to pain.  Therapist asks patient what is she struggling with? She says her medical issues stopped her from doing lots of things in life keeps her from moving and walking. Can't do anything hurts really bad going to lay there only if have to go to bathroom. Explored what relieves it the pain is really bad today tossing and turning there is not a comfortable position to lay. Use capsaicin use onset when it helps. Has to be certain brand because of strength of it. 1.0 % has to be that strength to work. Talked about transportation left her eye doctor and they wouldn't answer the phone. Some lady paid  for her to get home patient was crying didn't realize she noticed her. She told her pay it forward. Doesn't know where she came from she was there and then went to her car. Therapist asked what it makes her think something spiritual patient something like higher power. Told the lady just want to  get home. Therapist said want to be like that patient always wants to be like that helps people when she can. If somebody needs something to eat not over $10 just do it dip into the bill money.  Reviewed what we talked about and patient said to get someone's attention to ask what need to ask. Therapist said someway to get treatment so not in so much pain. Patient says would take just to take the edge of probably a combination of things. Maybe  Capsaicin patch or cream, heating pad muscle relaxant different things use to make it bearable. A combination of these things will help so could do something like go in and cook. Insurance won't pay for Capsaicin.  Doctors don't realize that patient knows a combination of things not just a pill. Maybe a procedure or Cortizone shot maybe pill, muscle relaxant and heat pad maybe Tylenol in between but a combination. Her doctor says she takes too many pills. Too much serotonin is what doctor says patient looks at her don't know what talking about when she has to deal with these pain issues    Patient has ongoing pain issues and when therapist asked what she is struggling with it is this issue.  Therapist provided support and space for patient to process her frustrations and assess its meaningful and helpful to have an outlet in relationship with somebody who knows what is going on and cares.  Looked at her options getting another provider talked about using the various strategies which therapist that was very insightful to help reduce pain.  We also talked about a positive experience somebody who saw she was struggling when out of their way to be kind and just asked patient to pay it forward.  Think it is helpful to talk about the positive and life and her experiences to remain hopeful to remain positive in perspective.  Patient is going to pay it forward inspired therapist to look for ways to do that too.  Patient dealing with significant pain so says helpful to have an  outlet she is limited in functioning so she needs these types of outlets for her wellbeing   Suicidal/Homicidal: No  Plan: Return again in 2 weeks.2.Assess very helpful for patient to get supportive interventions process thoughts and feelings in session   Diagnosis: Major depressive disorder, recurrent, moderate, generalized anxiety disorder, chronic PTSD, panic attacks  Collaboration of Care: Other none needed  Patient/Guardian was advised Release of Information must be obtained prior to any record release in order to collaborate their care with an outside provider. Patient/Guardian was advised if they have not already done so to contact the registration department to sign all necessary forms in order for [name] to release information regarding their care.   Consent: Patient/Guardian gives verbal consent for treatment and assignment of benefits for services provided during this visit. Patient/Guardian expressed understanding and agreed to proceed.   Coolidge Breeze, LCSW 10/18/2022

## 2022-10-21 ENCOUNTER — Other Ambulatory Visit: Payer: Self-pay

## 2022-10-21 MED ORDER — OMEPRAZOLE 40 MG PO CPDR: 40.0000 mg | DELAYED_RELEASE_CAPSULE | Freq: Every day | ORAL | 0 refills | Status: DC

## 2022-10-24 ENCOUNTER — Encounter: Payer: Self-pay | Admitting: Internal Medicine

## 2022-11-01 ENCOUNTER — Ambulatory Visit (INDEPENDENT_AMBULATORY_CARE_PROVIDER_SITE_OTHER): Payer: 59 | Admitting: Licensed Clinical Social Worker

## 2022-11-01 DIAGNOSIS — F4312 Post-traumatic stress disorder, chronic: Secondary | ICD-10-CM

## 2022-11-01 DIAGNOSIS — F410 Panic disorder [episodic paroxysmal anxiety] without agoraphobia: Secondary | ICD-10-CM | POA: Diagnosis not present

## 2022-11-01 DIAGNOSIS — F411 Generalized anxiety disorder: Secondary | ICD-10-CM

## 2022-11-01 DIAGNOSIS — F331 Major depressive disorder, recurrent, moderate: Secondary | ICD-10-CM

## 2022-11-01 NOTE — Progress Notes (Signed)
Virtual Visit via Video Note  I connected with Lindsay [name] on 11/01/22 at  8:00 AM EDT by a video enabled telemedicine application and verified that I am speaking with the correct person using two identifiers.  Location: Patient: home Provider: home office   I discussed the limitations of evaluation and management by telemedicine and the availability of in person appointments. The patient expressed understanding and agreed to proceed.   I discussed the assessment and treatment plan with the patient. The patient was provided an opportunity to ask questions and all were answered. The patient agreed with the plan and demonstrated an understanding of the instructions.   The patient was advised to call back or seek an in-person evaluation if the symptoms worsen or if the condition fails to improve as anticipated.  I provided 40 minutes of non-face-to-face time during this encounter.  THERAPIST PROGRESS NOTE  Session Time: 8:00 AM to 8:40 AM  Participation Level: Active  Behavioral Response: CasualAlertDysphoric  Type of Therapy: Individual Therapy  Treatment Goals addressed: patient continue to utilize therapy for  supportive and strength-based interventions, provide treatment interventions in the context of patient continuing to seek help and cope with medical issues, anxiety, triggers, coping with mental health symptoms, trauma, utilize therapy as a way for patient to focus on   ProgressTowards Goals: Progressing-patient's medical condition continues to be very challenging for her assess helpful to have an outlet to talk about thoughts and feelings therapist providing supportive strength-based intervention  Interventions: Solution Focused, Strength-based, Supportive, Reframing, and Other: coping  Summary: Lindsay [name] is a 52 y.o. female who presents with blah. Just been in a lot of pain, a lot, not in a particular area when stand up can't walk has to shuffle her feet.  Therapist  reviewed what she understood to be patient's medication protocol patient reviewed that instead of taking pain medication every 4 take takes it every 6 hours eventually going to take her off. Patient asked what she is going to do and doctor just sat there. Explored options with patient. She could go somewhere else but could be worse and that would piss her off. Doctor said treatment would be a combination of things patient doesn't have a problem with that just find the combination. Therapist observed that she ends up in her bed a lot and patient agrees. Patient is drowsy now took medicine. Therapist would like treatment options for patient hard to see the issue continue but it has to be the doctors that come up with something.  Patient couldn't think of anything good at the moment because drowsy.  Therapist reminded her of the lady who gave her a car ride home is remembering a positive. Therapist saw the upside that she could get some sleep and that would help. Patient said for an hour or two.  Therapist shared her positive seeing her birds outside at the bird feeder.  Therapist provided empathetic listening direct questioning assisted through strategic reflection looked at realistic coping skills discussed patient's problems problem solving.  Therapist continues to explore solutions with patient for her medical issues goal though because therapist is not medical provider but assess helpful for patient to get somebody listening providing supportive and strength-based intervention.  Suicidal/Homicidal: No  Plan: Return again in 2 weeks.2.Assess very helpful for patient to get supportive interventions process thoughts and feelings in session  Diagnosis: Major depressive disorder, recurrent, moderate, generalized anxiety disorder, chronic PTSD, panic attacks  Collaboration of Care: Other none needed  Patient/Guardian was advised  Release of Information must be obtained prior to any record release in order to  collaborate their care with an outside provider. Patient/Guardian was advised if they have not already done so to contact the registration department to sign all necessary forms in order for [name] to release information regarding their care.   Consent: Patient/Guardian gives verbal consent for treatment and assignment of benefits for services provided during this visit. Patient/Guardian expressed understanding and agreed to proceed.   Coolidge Breeze, LCSW 11/01/2022

## 2022-11-06 ENCOUNTER — Other Ambulatory Visit: Payer: Self-pay | Admitting: Internal Medicine

## 2022-11-06 MED ORDER — ALBUTEROL SULFATE (2.5 MG/3ML) 0.083% IN NEBU: 2.5000 mg | INHALATION_SOLUTION | Freq: Four times a day (QID) | RESPIRATORY_TRACT | 0 refills | Status: DC | PRN

## 2022-11-06 NOTE — Telephone Encounter (Signed)
Next appt scheduled 10/23 with PCP. 

## 2022-11-06 NOTE — Telephone Encounter (Signed)
  albuterol (PROVENTIL) (2.5 MG/3ML) 0.083% nebulizer solution  [id number] - [place], [place] - [address]

## 2022-11-07 ENCOUNTER — Encounter: Payer: Self-pay | Admitting: Dietician

## 2022-11-15 ENCOUNTER — Telehealth: Payer: 59 | Admitting: Physician Assistant

## 2022-11-15 ENCOUNTER — Ambulatory Visit (INDEPENDENT_AMBULATORY_CARE_PROVIDER_SITE_OTHER): Payer: 59 | Admitting: Licensed Clinical Social Worker

## 2022-11-15 DIAGNOSIS — L299 Pruritus, unspecified: Secondary | ICD-10-CM | POA: Diagnosis not present

## 2022-11-15 DIAGNOSIS — R42 Dizziness and giddiness: Secondary | ICD-10-CM | POA: Diagnosis not present

## 2022-11-15 DIAGNOSIS — F410 Panic disorder [episodic paroxysmal anxiety] without agoraphobia: Secondary | ICD-10-CM

## 2022-11-15 DIAGNOSIS — F4312 Post-traumatic stress disorder, chronic: Secondary | ICD-10-CM | POA: Diagnosis not present

## 2022-11-15 DIAGNOSIS — R110 Nausea: Secondary | ICD-10-CM | POA: Diagnosis not present

## 2022-11-15 DIAGNOSIS — F411 Generalized anxiety disorder: Secondary | ICD-10-CM | POA: Diagnosis not present

## 2022-11-15 DIAGNOSIS — F331 Major depressive disorder, recurrent, moderate: Secondary | ICD-10-CM | POA: Diagnosis not present

## 2022-11-15 MED ORDER — HYDROXYZINE HCL 10 MG PO TABS: 10.0000 mg | ORAL_TABLET | Freq: Four times a day (QID) | ORAL | 0 refills | Status: DC | PRN

## 2022-11-15 MED ORDER — MECLIZINE HCL 25 MG PO TABS
25.0000 mg | ORAL_TABLET | Freq: Three times a day (TID) | ORAL | 0 refills | Status: DC | PRN
Start: 2022-11-15 — End: 2022-12-04

## 2022-11-15 NOTE — Patient Instructions (Signed)
Lindsay [name], thank you for joining Margaretann Loveless, PA-C for today's virtual visit.  While this provider is not your primary care provider (PCP), if your PCP is located in our provider database this encounter information will be shared with them immediately following your visit.   A [facility] MyChart account gives you access to today's visit and all your visits, tests, and labs performed at [facility] " click here if you don't have a [facility] MyChart account or go to [email or web address]  Consent: (Patient) Lindsay [name] provided verbal consent for this virtual visit at the beginning of the encounter.  Current Medications:  Current Outpatient Medications:    hydrOXYzine (ATARAX) 10 MG tablet, Take 1 tablet (10 mg total) by mouth every 6 (six) hours as needed., Disp: 40 tablet, Rfl: 0   ACCU-CHEK GUIDE test strip, USE ONE TEST STRIP TO CHECK BLOOD SUGARS ONCE A DAY AS NEEDED, Disp: 100 strip, Rfl: 1   Accu-Chek Softclix Lancets lancets, USE ONE LANCET TO CHECK BLOOD SUGAR ONE A DAY AS NEEDED, Disp: 100 each, Rfl: 1   acetaminophen (TYLENOL) 500 MG tablet, Take 1 tablet (500 mg total) by mouth every 6 (six) hours as needed., Disp: 30 tablet, Rfl: 0   albuterol (PROVENTIL) (2.5 MG/3ML) 0.083% nebulizer solution, Take 3 mLs (2.5 mg total) by nebulization every 6 (six) hours as needed for wheezing or shortness of breath., Disp: 150 mL, Rfl: 0   albuterol (VENTOLIN HFA) 108 (90 Base) MCG/ACT inhaler, TAKE 2 PUFFS BY MOUTH EVERY 6 HOURS AS NEEDED FOR WHEEZE OR SHORTNESS OF BREATH, Disp: 8.5 each, Rfl: 0   atorvastatin (LIPITOR) 40 MG tablet, TAKE 1 TABLET BY MOUTH EVERY DAY, Disp: 90 tablet, Rfl: 3   azelastine (ASTELIN) 0.1 % nasal spray, Place 1 spray into both nostrils 2 (two) times daily. Use in each nostril as directed, Disp: 30 mL, Rfl: 12   benzonatate (TESSALON PERLES) 100 MG capsule, Take 1 capsule (100 mg total) by mouth every 6 (six) hours as needed for cough.,  Disp: 30 capsule, Rfl: 1   Blood Glucose Monitoring Suppl (ACCU-CHEK GUIDE ME) w/Device KIT, Dispense one device, Disp: 1 kit, Rfl: 0   Brexpiprazole (REXULTI) 3 MG TABS, Take 1 tablet (3 mg total) by mouth daily., Disp: 30 tablet, Rfl: 2   cetirizine (ZYRTEC) 10 MG tablet, Take 1 tablet (10 mg total) by mouth daily., Disp: 90 tablet, Rfl: 1   clindamycin (CLINDAGEL) 1 % gel, APPLY TO AFFECTED AREA TWICE A DAY, Disp: 30 g, Rfl: 0   CVS D3 25 MCG (1000 UT) capsule, TAKE 1 CAPSULE BY MOUTH EVERY DAY, Disp: 90 capsule, Rfl: 1   dicyclomine (BENTYL) 10 MG capsule, Take 2 capsules (20 mg total) by mouth 3 (three) times daily as needed for spasms (abdominal pain)., Disp: 30 capsule, Rfl: 1   doxepin (SINEQUAN) 25 MG capsule, Take 1 capsule (25 mg total) by mouth at bedtime., Disp: 30 capsule, Rfl: 2   famotidine (PEPCID) 20 MG tablet, Take 1 tablet (20 mg total) by mouth daily., Disp: 90 tablet, Rfl: 1   ferrous sulfate 325 (65 FE) MG EC tablet, Take 1 tablet (325 mg total) by mouth daily with breakfast. (Patient taking differently: Take 325 mg by mouth every other day.), Disp: 90 tablet, Rfl: 1   fluticasone (FLONASE) 50 MCG/ACT nasal spray, SPRAY 1 SPRAY INTO EACH NOSTRIL DAILY, Disp: 32 mL, Rfl: 1   ipratropium (ATROVENT) 0.06 % nasal spray, PLEASE SEE ATTACHED FOR DETAILED DIRECTIONS, Disp: ,  Rfl:    lamoTRIgine (LAMICTAL) 100 MG tablet, Take 1 tablet (100 mg total) by mouth daily., Disp: 30 tablet, Rfl: 2   meclizine (ANTIVERT) 25 MG tablet, Take 1 tablet (25 mg total) by mouth 3 (three) times daily as needed for dizziness., Disp: 90 tablet, Rfl: 0   megestrol (MEGACE) 40 MG tablet, Take 2 tablets (80 mg total) by mouth daily., Disp: 180 tablet, Rfl: 1   meloxicam (MOBIC) 15 MG tablet, Take 1 tablet (15 mg total) by mouth daily., Disp: 30 tablet, Rfl: 2   metoprolol succinate (TOPROL-XL) 50 MG 24 hr tablet, Take 1 tablet (50 mg total) by mouth daily., Disp: 90 tablet, Rfl: 3   omeprazole (PRILOSEC)  40 MG capsule, Take 1 capsule (40 mg total) by mouth daily., Disp: 90 capsule, Rfl: 0   ondansetron (ZOFRAN-ODT) 8 MG disintegrating tablet, TAKE 1 TABLET BY MOUTH EVERY 8 HOURS AS NEEDED FOR NAUSEA OR VOMITING., Disp: 90 tablet, Rfl: 1   polyethylene glycol (MIRALAX / GLYCOLAX) 17 g packet, Take 17 g by mouth daily., Disp: 14 each, Rfl: 0   prochlorperazine (COMPAZINE) 5 MG tablet, Take 1 tablet (5 mg total) by mouth every 8 (eight) hours as needed for up to 2 days for nausea or vomiting., Disp: 6 tablet, Rfl: 0   Semaglutide, 2 MG/DOSE, (OZEMPIC, 2 MG/DOSE,) 8 MG/3ML SOPN, Inject 2 mg into the skin once a week., Disp: 9 mL, Rfl: 1   senna (SENOKOT) 8.6 MG TABS tablet, Take 2 tablets (17.2 mg total) by mouth 2 (two) times daily., Disp: 120 tablet, Rfl: 0   SUMAtriptan (IMITREX) 50 MG tablet, Take 1 tablet (50 mg total) by mouth every 2 (two) hours as needed for up to 10 doses for migraine. May repeat in 2 hours if headache persists or recurs., Disp: 10 tablet, Rfl: 0   tiZANidine (ZANAFLEX) 4 MG tablet, Take 1 tablet (4 mg total) by mouth every 8 (eight) hours as needed for muscle spasms., Disp: 90 tablet, Rfl: 5   torsemide (DEMADEX) 20 MG tablet, Take 2 tablets (40 mg total) by mouth 2 (two) times daily., Disp: 360 tablet, Rfl: 1   traMADol (ULTRAM) 50 MG tablet, Take 1 tablet (50 mg total) by mouth every 4 (four) hours as needed., Disp: 180 tablet, Rfl: 0   Medications ordered in this encounter:  Meds ordered this encounter  Medications   hydrOXYzine (ATARAX) 10 MG tablet    Sig: Take 1 tablet (10 mg total) by mouth every 6 (six) hours as needed.    Dispense:  40 tablet    Refill:  0    Order Specific Question:   Supervising Provider    Answer:   Merrilee Jansky [[id number]]   meclizine (ANTIVERT) 25 MG tablet    Sig: Take 1 tablet (25 mg total) by mouth 3 (three) times daily as needed for dizziness.    Dispense:  90 tablet    Refill:  0    Order Specific Question:   Supervising Provider     Answer:   Merrilee Jansky [id number]     *If you need refills on other medications prior to your next appointment, please contact your pharmacy*  Follow-Up: Call back or seek an in-person evaluation if the symptoms worsen or if the condition fails to improve as anticipated.   Virtual Care [phone]  Other Instructions Pruritus Pruritus is an itchy feeling on the skin. One of the most common causes is dry skin, but many different  things can cause itching. Most cases of itching do not require medical attention. Sometimes itchy skin can turn into a rash or a secondary infection. Follow these instructions at home: Skin care  Do not use scented soaps, detergents, perfumes, and cosmetic products. Instead, use gentle, unscented versions of these items. Apply moisturizing creams to your skin frequently, at least twice daily. Apply immediately after bathing while skin is still wet. Take medicines or apply medicated creams only as told by your health care provider. This may include: Corticosteroid cream or topical calcineurin inhibitor. Anti-itch lotions containing urea, camphor, or menthol. Oral antihistamines. Do not take hot showers or baths, which can make itching worse. A short, cool shower may help with itching as long as you apply moisturizing lotion after the shower. Apply a cool, wet cloth (cool compress) to the affected areas. You may take lukewarm baths with one of the following: Epsom salts. You can get these at your local pharmacy or grocery store. Follow the instructions on the packaging. Baking soda. Pour a small amount into the bath as told by your health care provider. Colloidal oatmeal. You can get this at your local pharmacy or grocery store. Follow the instructions on the packaging. Do not scratch your skin. General instructions Avoid wearing tight clothes. Keep a journal to help find out what is causing your itching. Write down: What you eat and  drink. What cosmetic products you use. What soaps or detergents you use. What you wear, including jewelry. Use a humidifier. This keeps the air moist, which helps to prevent dry skin. Be aware of any changes in your itchiness. Tell your health care provider about any changes. Contact a health care provider if: The itching does not go away after several days. You notice redness, warmth, or drainage on the skin where you have scratched. You are unusually thirsty or urinating more than normal. Your skin tingles or feels numb. Your skin or the white parts of your eyes turn yellow (jaundice). You feel weak. You have any of the following: Night sweats. Tiredness (fatigue). Weight loss. Abdominal pain. Summary Pruritus is an itchy feeling on the skin. One of the most common causes is dry skin, but many different conditions and factors can cause itching. Apply moisturizing creams to your skin frequently, at least twice daily. Apply immediately after bathing while skin is still wet. Take medicines or apply medicated creams only as told by your health care provider. Do not take hot showers or baths. Do not use scented soaps, detergents, perfumes, or cosmetic products. Keep a journal to help find out what is causing your itching. This information is not intended to replace advice given to you by your health care provider. Make sure you discuss any questions you have with your health care provider. Document Revised: 02/21/2021 Document Reviewed: 02/21/2021 Elsevier Patient Education  2024 Elsevier Inc.    If you have been instructed to have an in-person evaluation today at a local Urgent Care facility, please use the link below. It will take you to a list of all of our available [facility], including address, phone number and hours of operation. Please do not delay care.  [facility]  If you or a family member do not have a primary care provider, use the link below to  schedule a visit and establish care. When you choose a [facility] primary care physician or advanced practice provider, you gain a long-term partner in health. Find a Primary Care Provider  Learn more about [facility] in-office and virtual care options: [facility] - Get Care Now

## 2022-11-15 NOTE — Progress Notes (Signed)
Virtual Visit Consent   [name], you are scheduled for a virtual visit with a [facility] provider today. Just as with appointments in the office, your consent must be obtained to participate. Your consent will be active for this visit and any virtual visit you may have with one of our providers in the next 365 days. If you have a MyChart account, a copy of this consent can be sent to you electronically.  As this is a virtual visit, video technology does not allow for your provider to perform a traditional examination. This may limit your provider's ability to fully assess your condition. If your provider identifies any concerns that need to be evaluated in person or the need to arrange testing (such as labs, EKG, etc.), we will make arrangements to do so. Although advances in technology are sophisticated, we cannot ensure that it will always work on either your end or our end. If the connection with a video visit is poor, the visit may have to be switched to a telephone visit. With either a video or telephone visit, we are not always able to ensure that we have a secure connection.  By engaging in this virtual visit, you consent to the provision of healthcare and authorize for your insurance to be billed (if applicable) for the services provided during this visit. Depending on your insurance coverage, you may receive a charge related to this service.  I need to obtain your verbal consent now. Are you willing to proceed with your visit today? Lindsay [name] has provided verbal consent on 11/15/2022 for a virtual visit (video or telephone). Margaretann Loveless, PA-C  Date: 11/15/2022 2:15 PM  Virtual Visit via Video Note   I, Margaretann Loveless, connected with  Lindsay [name]  ([id number], 30-Jun-1970) on 11/15/22 at  1:45 PM EDT by a video-enabled telemedicine application and verified that I am speaking with the correct person using two identifiers.  Location: Patient: Virtual Visit Location  Patient: Home Provider: Virtual Visit Location Provider: Home Office   I discussed the limitations of evaluation and management by telemedicine and the availability of in person appointments. The patient expressed understanding and agreed to proceed.    History of Present Illness: Lindsay [name] is a 52 y.o. who identifies as a female who was assigned female at birth, and is being seen today for itching and vomiting.  HPI: Emesis  This is a recurrent problem. The current episode started in the past 7 days (Tuesday night, 11/12/22; started with nausea and vomiting and has progressed to intense, diffuse itching). The problem occurs less than 2 times per day. The problem has been unchanged. The emesis has an appearance of stomach contents. There has been no fever. Associated symptoms include headaches (started just today). Pertinent negatives include no arthralgias, chest pain, chills, coughing, diarrhea, dizziness, fever, myalgias or URI. Associated symptoms comments: Intense, diffuse itching, nausea . She has tried increased fluids and bed rest (benadryl, zofran) for the symptoms. The treatment provided no relief.   Feels Zofran increases nausea after taking a dose. Does normally work well for her.   Problems:  Patient Active Problem List   Diagnosis Date Noted   Polypharmacy 09/04/2022   Abdominal pain 08/10/2022   Elevated serum creatinine 08/10/2022   Gastroenteritis 08/07/2022   Alopecia 08/07/2022   Facial droop 05/22/2022   Lip swelling 05/22/2022   Fibroid, uterine 05/02/2022   Scoliosis 03/27/2022   Weakness 03/27/2022   Thoracic back pain 03/18/2022   Acute left  flank pain 03/15/2022   Post-nasal drip 01/17/2022   Nexplanon in place 01/09/2022   COPD (chronic obstructive pulmonary disease) (HCC) 12/27/2021   Tremor 12/27/2021   Pharyngitis 09/12/2021   History of torn meniscus of left knee 05/23/2021   History of CVA (cerebrovascular accident) 05/23/2021   Amenorrhea  11/08/2020   Memory problem 11/08/2020   Onychomycosis 11/08/2020   Hypoactive sexual desire disorder due to medical condition in female 10/16/2020   Diabetes (HCC) 02/03/2020   HLD (hyperlipidemia) 02/03/2020   Dizziness 02/02/2020   Pain in both knees 12/09/2019   Chronic heart failure (HCC) 11/25/2019   OSA (obstructive sleep apnea) 06/14/2019   Right shoulder pain 04/21/2019   Gum lesion 03/15/2019   Compression fracture of body of thoracic vertebra (HCC) 03/08/2019   Iron deficiency 02/10/2019   History of colon polyps    Chronic, continuous use of opioids 12/05/2017   Chronic respiratory failure with hypoxia (HCC)    Vitamin B12 deficiency 05/02/2017   Palpitations 04/11/2017   Cough 03/25/2017   Vitamin D deficiency, unspecified 06/12/2016   Nicotine dependence 06/12/2016   Menorrhagia with irregular cycle 01/08/2016   Migraines 01/08/2016   Leg pain, bilateral 11/29/2015   Fibromyalgia 07/05/2015   Acute pain of left knee 09/26/2014   Healthcare maintenance 09/26/2014   Morbid obesity (HCC) 07/04/2014   Pelvic pain 03/25/2014   Major depressive disorder, recurrent episode, moderate (HCC) 10/13/2012   Generalized anxiety disorder 10/13/2012   Chronic nausea 05/10/2010   IBS (irritable bowel syndrome) 06/16/2007   Essential hypertension 02/06/2006   Hidradenitis 11/18/2005    Allergies:  Allergies  Allergen Reactions   Lisinopril Rash and Cough   Medications:  Current Outpatient Medications:    hydrOXYzine (ATARAX) 10 MG tablet, Take 1 tablet (10 mg total) by mouth every 6 (six) hours as needed., Disp: 40 tablet, Rfl: 0   ACCU-CHEK GUIDE test strip, USE ONE TEST STRIP TO CHECK BLOOD SUGARS ONCE A DAY AS NEEDED, Disp: 100 strip, Rfl: 1   Accu-Chek Softclix Lancets lancets, USE ONE LANCET TO CHECK BLOOD SUGAR ONE A DAY AS NEEDED, Disp: 100 each, Rfl: 1   acetaminophen (TYLENOL) 500 MG tablet, Take 1 tablet (500 mg total) by mouth every 6 (six) hours as needed.,  Disp: 30 tablet, Rfl: 0   albuterol (PROVENTIL) (2.5 MG/3ML) 0.083% nebulizer solution, Take 3 mLs (2.5 mg total) by nebulization every 6 (six) hours as needed for wheezing or shortness of breath., Disp: 150 mL, Rfl: 0   albuterol (VENTOLIN HFA) 108 (90 Base) MCG/ACT inhaler, TAKE 2 PUFFS BY MOUTH EVERY 6 HOURS AS NEEDED FOR WHEEZE OR SHORTNESS OF BREATH, Disp: 8.5 each, Rfl: 0   atorvastatin (LIPITOR) 40 MG tablet, TAKE 1 TABLET BY MOUTH EVERY DAY, Disp: 90 tablet, Rfl: 3   azelastine (ASTELIN) 0.1 % nasal spray, Place 1 spray into both nostrils 2 (two) times daily. Use in each nostril as directed, Disp: 30 mL, Rfl: 12   benzonatate (TESSALON PERLES) 100 MG capsule, Take 1 capsule (100 mg total) by mouth every 6 (six) hours as needed for cough., Disp: 30 capsule, Rfl: 1   Blood Glucose Monitoring Suppl (ACCU-CHEK GUIDE ME) w/Device KIT, Dispense one device, Disp: 1 kit, Rfl: 0   Brexpiprazole (REXULTI) 3 MG TABS, Take 1 tablet (3 mg total) by mouth daily., Disp: 30 tablet, Rfl: 2   cetirizine (ZYRTEC) 10 MG tablet, Take 1 tablet (10 mg total) by mouth daily., Disp: 90 tablet, Rfl: 1   clindamycin (CLINDAGEL)  1 % gel, APPLY TO AFFECTED AREA TWICE A DAY, Disp: 30 g, Rfl: 0   CVS D3 25 MCG (1000 UT) capsule, TAKE 1 CAPSULE BY MOUTH EVERY DAY, Disp: 90 capsule, Rfl: 1   dicyclomine (BENTYL) 10 MG capsule, Take 2 capsules (20 mg total) by mouth 3 (three) times daily as needed for spasms (abdominal pain)., Disp: 30 capsule, Rfl: 1   doxepin (SINEQUAN) 25 MG capsule, Take 1 capsule (25 mg total) by mouth at bedtime., Disp: 30 capsule, Rfl: 2   famotidine (PEPCID) 20 MG tablet, Take 1 tablet (20 mg total) by mouth daily., Disp: 90 tablet, Rfl: 1   ferrous sulfate 325 (65 FE) MG EC tablet, Take 1 tablet (325 mg total) by mouth daily with breakfast. (Patient taking differently: Take 325 mg by mouth every other day.), Disp: 90 tablet, Rfl: 1   fluticasone (FLONASE) 50 MCG/ACT nasal spray, SPRAY 1 SPRAY INTO  EACH NOSTRIL DAILY, Disp: 32 mL, Rfl: 1   ipratropium (ATROVENT) 0.06 % nasal spray, PLEASE SEE ATTACHED FOR DETAILED DIRECTIONS, Disp: , Rfl:    lamoTRIgine (LAMICTAL) 100 MG tablet, Take 1 tablet (100 mg total) by mouth daily., Disp: 30 tablet, Rfl: 2   meclizine (ANTIVERT) 25 MG tablet, Take 1 tablet (25 mg total) by mouth 3 (three) times daily as needed for dizziness., Disp: 90 tablet, Rfl: 0   megestrol (MEGACE) 40 MG tablet, Take 2 tablets (80 mg total) by mouth daily., Disp: 180 tablet, Rfl: 1   meloxicam (MOBIC) 15 MG tablet, Take 1 tablet (15 mg total) by mouth daily., Disp: 30 tablet, Rfl: 2   metoprolol succinate (TOPROL-XL) 50 MG 24 hr tablet, Take 1 tablet (50 mg total) by mouth daily., Disp: 90 tablet, Rfl: 3   omeprazole (PRILOSEC) 40 MG capsule, Take 1 capsule (40 mg total) by mouth daily., Disp: 90 capsule, Rfl: 0   ondansetron (ZOFRAN-ODT) 8 MG disintegrating tablet, TAKE 1 TABLET BY MOUTH EVERY 8 HOURS AS NEEDED FOR NAUSEA OR VOMITING., Disp: 90 tablet, Rfl: 1   polyethylene glycol (MIRALAX / GLYCOLAX) 17 g packet, Take 17 g by mouth daily., Disp: 14 each, Rfl: 0   prochlorperazine (COMPAZINE) 5 MG tablet, Take 1 tablet (5 mg total) by mouth every 8 (eight) hours as needed for up to 2 days for nausea or vomiting., Disp: 6 tablet, Rfl: 0   Semaglutide, 2 MG/DOSE, (OZEMPIC, 2 MG/DOSE,) 8 MG/3ML SOPN, Inject 2 mg into the skin once a week., Disp: 9 mL, Rfl: 1   senna (SENOKOT) 8.6 MG TABS tablet, Take 2 tablets (17.2 mg total) by mouth 2 (two) times daily., Disp: 120 tablet, Rfl: 0   SUMAtriptan (IMITREX) 50 MG tablet, Take 1 tablet (50 mg total) by mouth every 2 (two) hours as needed for up to 10 doses for migraine. May repeat in 2 hours if headache persists or recurs., Disp: 10 tablet, Rfl: 0   tiZANidine (ZANAFLEX) 4 MG tablet, Take 1 tablet (4 mg total) by mouth every 8 (eight) hours as needed for muscle spasms., Disp: 90 tablet, Rfl: 5   torsemide (DEMADEX) 20 MG tablet, Take 2  tablets (40 mg total) by mouth 2 (two) times daily., Disp: 360 tablet, Rfl: 1   traMADol (ULTRAM) 50 MG tablet, Take 1 tablet (50 mg total) by mouth every 4 (four) hours as needed., Disp: 180 tablet, Rfl: 0  Observations/Objective: Patient is well-developed, well-nourished in no acute distress.  Resting comfortably at home.  Head is normocephalic, atraumatic.  No labored  breathing.  Speech is clear and coherent with logical content.  Patient is alert and oriented at baseline.  Constant itching and scratching all over  Assessment and Plan: 1. Itching - hydrOXYzine (ATARAX) 10 MG tablet; Take 1 tablet (10 mg total) by mouth every 6 (six) hours as needed.  Dispense: 40 tablet; Refill: 0  2. Dizziness - meclizine (ANTIVERT) 25 MG tablet; Take 1 tablet (25 mg total) by mouth 3 (three) times daily as needed for dizziness.  Dispense: 90 tablet; Refill: 0  3. Nausea  - Concerned for patient of AKI or acute hepatic injury due to comorbidities - Advised patient to seek in person evaluation for labs, but she reports she has no transportation and it can take up to 30 days to schedule transportation - Advised would trial Hydroxyzine 10mg  1 tablet every 6 hours for itching - Meclizine refilled at patient request - Continue Zofran as needed for nausea - Strict ER precautions if symptoms worsen or fail to improve - Keep scheduled f/u with PCP next week (11/20/22)  Follow Up Instructions: I discussed the assessment and treatment plan with the patient. The patient was provided an opportunity to ask questions and all were answered. The patient agreed with the plan and demonstrated an understanding of the instructions.  A copy of instructions were sent to the patient via MyChart unless otherwise noted below.    The patient was advised to call back or seek an in-person evaluation if the symptoms worsen or if the condition fails to improve as anticipated.    Margaretann Loveless, PA-C

## 2022-11-15 NOTE — Progress Notes (Signed)
Virtual Visit via Video Note  I connected with Lindsay [name] on 11/15/22 at  8:00 AM EDT by a video enabled telemedicine application and verified that I am speaking with the correct person using two identifiers.  Location: Patient: home Provider: home office   I discussed the limitations of evaluation and management by telemedicine and the availability of in person appointments. The patient expressed understanding and agreed to proceed.  I discussed the assessment and treatment plan with the patient. The patient was provided an opportunity to ask questions and all were answered. The patient agreed with the plan and demonstrated an understanding of the instructions.   The patient was advised to call back or seek an in-person evaluation if the symptoms worsen or if the condition fails to improve as anticipated.  I provided 40 minutes of non-face-to-face time during this encounter.  THERAPIST PROGRESS NOTE  Session Time: 8:00 AM to 8:40 AM  Participation Level: Active  Behavioral Response: CasualAlertDysphoric could not stop moving in session due to itching issues also continued pain issues  Type of Therapy: Individual Therapy  Treatment Goals addressed: patient continue to utilize therapy for  supportive and strength-based interventions, provide treatment interventions in the context of patient continuing to seek help and cope with medical issues, anxiety, triggers, coping with mental health symptoms, trauma, utilize therapy as a way for patient to focus on   ProgressTowards Goals: Progressing-patient continues to have stressors helpful to have therapy processing thoughts and feelings help cope with her stressors  Interventions: Solution Focused, Strength-based, Supportive, and Other: coping  Summary: Lindsay [name] is a 52 y.o. female who presents with every since Tuesday itching this happened 2-3 months ago lasted a couple of days and went away. Will get Benadryl today. All over. It  might be nerves, stress. Haven't  changed bed stuff or washing powders washed a couple of weeks ago, since none of that changed can't be reaction to those things. Doesn't have heat the housing sends an older man and he messes things up instead of fixing it. Supposed to fix leaks and the still there. After she gets off this call going to contact a lady say they don't have heat. Patient says never a person be be stressed but in her life always something going on.  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 Still hurting like hell especially the left knee hurt really bad legs hurt but left knee is crazy couldn't get in the shower going to PCP next Wednesday. Department [employer] Social Services on hold for 30 minutes and they do the transportation. Even with heat with the cold and rain it hurts worse. Does have a heating blanket. On top of this sick in the stomach throwing up violently.  Problems with nursing signed up in July and haven't had services until this month supposed to be coming every day and not coming supposed to even come on the weekends. Oxygen machine running making it hot not hot this second. Reviewed session and patient says knows can't deal it with heat and itching but helps to talk to therapist.   Patient could not stop moving in session she was itching wanted session she thinks it is related to her nerves so assess helpful to use session to help with coping.  We talked about her stressors including no heat in the itching frustration with the nursing service.  She still has pain issues.  Assess helpful for dealing with these stressors to talk to therapist for support to process thoughts and feelings.  Writer used empathetic listening her stressors and identified realistic coping skills  Suicidal/Homicidal: No  Plan: Return again in 2 weeks.[name]2.Assess very helpful for patient to  get supportive interventions process thoughts and feelings in session  Diagnosis: Major depressive disorder, recurrent, moderate, generalized anxiety disorder, chronic PTSD, panic attacks  Collaboration of Care: Other none needed  Patient/Guardian was advised Release of Information must be obtained prior to any record release in order to collaborate their care with an outside provider. Patient/Guardian was advised if they have not already done so to contact the registration department to sign all necessary forms in order for [name] to release information regarding their care.   Consent: Patient/Guardian gives verbal consent for treatment and assignment of benefits for services provided during this visit. Patient/Guardian expressed understanding and agreed to proceed.   Coolidge Breeze, LCSW 11/15/2022

## 2022-11-20 ENCOUNTER — Encounter: Payer: 59 | Admitting: Internal Medicine

## 2022-11-20 ENCOUNTER — Encounter: Payer: Self-pay | Admitting: Internal Medicine

## 2022-11-22 ENCOUNTER — Encounter: Payer: Self-pay | Admitting: Internal Medicine

## 2022-11-23 ENCOUNTER — Other Ambulatory Visit: Payer: Self-pay | Admitting: Student

## 2022-11-23 DIAGNOSIS — A084 Viral intestinal infection, unspecified: Secondary | ICD-10-CM

## 2022-11-23 DIAGNOSIS — L299 Pruritus, unspecified: Secondary | ICD-10-CM

## 2022-11-23 MED ORDER — HYDROXYZINE HCL 25 MG PO TABS: 25.0000 mg | ORAL_TABLET | Freq: Four times a day (QID) | ORAL | 0 refills | Status: DC | PRN

## 2022-11-23 MED ORDER — NAPROXEN 500 MG PO TABS: 500.0000 mg | ORAL_TABLET | Freq: Two times a day (BID) | ORAL | 0 refills | Status: DC

## 2022-11-23 MED ORDER — ONDANSETRON 8 MG PO TBDP
8.0000 mg | ORAL_TABLET | Freq: Three times a day (TID) | ORAL | 0 refills | Status: DC | PRN
Start: 2022-11-23 — End: 2023-01-20

## 2022-11-23 NOTE — Progress Notes (Signed)
Fielded call from this person.  She calls to request prescriptions to be sent to her pharmacy.  She requests hydroxyzine 25 mg for itching, which she has taken in the past.  She was evaluated via virtual visit for this itching few days ago.  No rash.  At that time lab studies were recommended.  She has follow-up appointment on November 6 for this in the clinic with her PCP.  She also requests refills of Zofran for nausea.  This is a chronic problem.  I see notes dating back to 2019 addressing chronic nausea, thought to be IBS versus side effect of antipsychotic.  Recommendation at that point was to follow-up with GI.  Will refill Zofran today, but stressed importance of follow-up in the clinic.  Will also refill naproxen per her request.  Recommend that she take Tylenol first for her arthritis pain before she takes naproxen.  Marrianne Mood MD 11/23/2022, 4:19 PM

## 2022-11-29 ENCOUNTER — Ambulatory Visit (HOSPITAL_COMMUNITY): Payer: 59 | Admitting: Licensed Clinical Social Worker

## 2022-12-02 NOTE — Progress Notes (Unsigned)
12/03/2022 [name] [id number] 02/10/1970  Referring provider: [name], MD Primary GI doctor:Dr. Leonides Schanz (Dr. Christella Hartigan)  ASSESSMENT AND PLAN:   Anemia New onset, no menstrual bleeding, some rectal bleeding since July, history of B12 def not on medications -Order labs to investigate cause of anemia. - If iron def, may need to consider EGD/colon  Rectal Bleeding Ongoing since July, associated with mucus in stool and rectal pain, constipation. History of hemorrhoidectomy 15 years ago. Hemorrhoids noted on exam -Sitz baths, increase fiber, increase water -Hydrocortisone supp given and external cream sent in.  -We discussed hemorrhoid banding here in the office for internal hemorrhoids if not improving with conservative treatment. - follow up for evaluation here in the office.   Constipation with large hard stool/impaction on exam Colon 2020 tics, otherwise normal, recall 2027 Bowel movements every 3-4 days, straining with bowel movements, hard stool noted on rectal exam. Patient on tramadol and Ozempic, both of which can contribute to constipation. Miralax not helping -Order Golytely and advise patient to take first half on day 1 and second half on day 2, along with enemas, to address fecal impaction. -Start Linzess after bowel prep to help with bowel movements and abdominal discomfort.  Gastroesophageal Reflux Disease (GERD) with nausea and vomiting Daily symptoms, possibly related to Ozempic. Patient on Prilosec, but still experiencing symptoms. -Increase Prilosec to 40mg  twice daily. - stop NSAIDS -Switch from Zofran to Promethazine for symptom control. -Provide gastroparesis diet information and consider endoscopy depending on lab results. Discussed GLP1 with the patient, mechanism of action and how this can worsen and/or cause nausea by causing gastroparesis.   Discuss with primary care see about potentially getting off this medication.  Follow-up Close  follow-up recommended due to multiple ongoing issues. Patient to message or call if any changes occur.   Patient Care Team: [name], MD as PCP - General (Internal Medicine) Rennis Golden Lisette Abu, MD as PCP - Cardiology (Cardiology)  HISTORY OF PRESENT ILLNESS: 52 y.o. female with a past medical history of HTN, COPD, OSA, HTN, heart failure, diabetes, IBS-C, and others listed below presents for evaluation of   Review of pertinent gastrointestinal problems: 1. Adenomatous polyps in her colon;   colonoscopy in 07/2007 with one polyp that was removed and was a hyperplastic polyp; also had hemorrhoids.  Colonoscopy  04/2015 Dr. Christella Hartigan (minor rectal bleeding); this was an incomplete colonoscopy 2 to BMI 50, I was only able to get to the ascending colon; 4 polyps were removed, 3 of them were adenomatous.  My office arranged barium enema but she never had that done. Colonoscopy 12/31/2018 with Dr. Christella Hartigan good bowel prep diverticulosis left colon no polyps or cancers recall 7 years 2.  Dyspepsia led to upper endoscopy May 2012.  Minor gastritis was noted, biopsies showed no sign of H. Pylori. Ultrasound June 2012 was normal.   HIDA scan June 2012 was also normal.   CT scan August 2012 was essentially normal except for "prominent stool"  08/10/2022 CT abdomen pelvis with contrast for abdominal pain shows mild diverticulosis without diverticulitis Initial CT and pelvis with right lower quadrant abdominal pain showed slightly enlarged appendix without any acute inflammation, suggested repeat 12 to 24 hours 08/10/2022 repeat CT abdomen pelvis with contrast for right lower quadrant abdominal pain showed prominent stool throughout the colon favors constipation, normal appendix, suspected 2.1 cm right uterine fibroid and aortic atherosclerosis  08/19/2022 labs reviewed show hemoglobin 10.6, has had 3 months of normocytic anemia MCV 86.1, platelets  484, normal liver and kidney 09/25/2022 internal medicine visit  with her primary care after multiple falls leading to ER visits, continue abdominal pain low-grade fever.  Patient was being tapered off tramadol for fear of serotonergic effect.  She is going from tramadol 50 mg every 4 hours every 6 hours  Discussed the use of AI scribe software for clinical note transcription with the patient, who gave verbal consent to proceed.  The patient, with a history of diabetes and anemia, presents with ongoing rectal bleeding since July. She reports passing a significant amount of mucus with her stool, which is often accompanied by rectal pain. The bleeding is described as small volume, noted on the stool and toilet paper. The patient also reports a history of hemorrhoids, for which she underwent a procedure approximately 15 years ago.  The patient has been experiencing constipation, with bowel movements occurring once every three to four days. She describes straining during bowel movements and a sensation of incomplete evacuation. She has tried various over-the-counter remedies, including Miralax and Senokot, without significant relief.  In addition to these symptoms, the patient reports ongoing nausea and vomiting, which occur daily and are exacerbated by eating. She has been managing these symptoms with Zofran, but reports that it is no longer effective. She denies dysphagia but reports a sensation of fullness and discomfort in the abdomen.  The patient has been on Ozempic for her diabetes for approximately a year. She reports weight loss since starting this medication, but denies any worsening of her gastrointestinal symptoms. She also takes tramadol for pain and has recently started on naproxen. She has been on Prilosec for reflux and heartburn, which she takes daily, and dicyclomine for abdominal pain. She reports some relief with the dicyclomine.   She  reports that she quit smoking about 3 years ago. Her smoking use included cigarettes. She started smoking about 28  years ago. She has a 2.5 pack-year smoking history. She has never used smokeless tobacco. She reports that she does not currently use alcohol. She reports that she does not currently use drugs after having used the following drugs: Marijuana. Frequency: 3.00 times per week.  RELEVANT LABS AND IMAGING:  Results   RADIOLOGY CT: No significant findings  DIAGNOSTIC Colonoscopy: Not due for another three years Rectal exam: Large hemorrhoids, significant fecal impaction (12/02/2022)      CBC    Component Value Date/Time   WBC 8.3 08/19/2022 1027   RBC 4.04 08/19/2022 1027   HGB 10.6 (L) 08/19/2022 1027   HGB 11.4 08/07/2022 1024   HCT 34.8 (L) 08/19/2022 1027   HCT 34.5 08/07/2022 1024   PLT 484 (H) 08/19/2022 1027   PLT 421 08/07/2022 1024   MCV 86.1 08/19/2022 1027   MCV 83 08/07/2022 1024   MCH 26.2 08/19/2022 1027   MCHC 30.5 08/19/2022 1027   RDW 16.1 (H) 08/19/2022 1027   RDW 14.3 08/07/2022 1024   LYMPHSABS 2.7 08/10/2022 0820   LYMPHSABS 2.0 08/07/2022 1024   MONOABS 0.9 08/10/2022 0820   EOSABS 0.2 08/10/2022 0820   EOSABS 0.2 08/07/2022 1024   BASOSABS 0.0 08/10/2022 0820   BASOSABS 0.0 08/07/2022 1024   Recent Labs    03/16/22 1743 04/23/22 1550 04/29/22 0958 05/16/22 0852 05/16/22 0858 08/07/22 1024 08/09/22 2024 08/10/22 0820 08/11/22 0223 08/19/22 1027  HGB 12.3 12.2 13.1 11.6* 12.2 11.4 10.1* 10.9* 9.3* 10.6*    CMP     Component Value Date/Time   NA 138 08/19/2022 1027  NA 142 08/07/2022 1024   K 4.1 08/19/2022 1027   CL 108 08/19/2022 1027   CO2 22 08/19/2022 1027   GLUCOSE 87 08/19/2022 1027   BUN 7 08/19/2022 1027   BUN 12 08/07/2022 1024   CREATININE 1.01 (H) 08/19/2022 1027   CREATININE 0.88 09/15/2013 1032   CALCIUM 8.6 (L) 08/19/2022 1027   PROT 6.7 08/19/2022 1027   PROT 6.7 08/07/2022 1024   ALBUMIN 3.5 08/19/2022 1027   ALBUMIN 4.3 08/07/2022 1024   AST 12 (L) 08/19/2022 1027   ALT 10 08/19/2022 1027   ALKPHOS 67 08/19/2022  1027   BILITOT 0.2 (L) 08/19/2022 1027   BILITOT <0.2 08/07/2022 1024   GFRNONAA >60 08/19/2022 1027   GFRAA 67 02/02/2020 0933      Latest Ref Rng & Units 08/19/2022   10:27 AM 08/11/2022    2:23 AM 08/09/2022    8:24 PM  Hepatic Function  Total Protein 6.5 - 8.1 g/dL 6.7  5.7  6.4   Albumin 3.5 - 5.0 g/dL 3.5  3.0  3.3   AST 15 - 41 U/L 12  9  9    ALT 0 - 44 U/L 10  10  10    Alk Phosphatase 38 - 126 U/L 67  52  58   Total Bilirubin 0.3 - 1.2 mg/dL 0.2  0.2  0.3       Current Medications:   Current Outpatient Medications (Endocrine & Metabolic):    Semaglutide, 2 MG/DOSE, (OZEMPIC, 2 MG/DOSE,) 8 MG/3ML SOPN, Inject 2 mg into the skin once a week.  Current Outpatient Medications (Cardiovascular):    atorvastatin (LIPITOR) 40 MG tablet, TAKE 1 TABLET BY MOUTH EVERY DAY   metoprolol succinate (TOPROL-XL) 50 MG 24 hr tablet, Take 1 tablet (50 mg total) by mouth daily.   torsemide (DEMADEX) 20 MG tablet, Take 2 tablets (40 mg total) by mouth 2 (two) times daily.  Current Outpatient Medications (Respiratory):    albuterol (PROVENTIL) (2.5 MG/3ML) 0.083% nebulizer solution, Take 3 mLs (2.5 mg total) by nebulization every 6 (six) hours as needed for wheezing or shortness of breath.   albuterol (VENTOLIN HFA) 108 (90 Base) MCG/ACT inhaler, TAKE 2 PUFFS BY MOUTH EVERY 6 HOURS AS NEEDED FOR WHEEZE OR SHORTNESS OF BREATH   azelastine (ASTELIN) 0.1 % nasal spray, Place 1 spray into both nostrils 2 (two) times daily. Use in each nostril as directed   benzonatate (TESSALON PERLES) 100 MG capsule, Take 1 capsule (100 mg total) by mouth every 6 (six) hours as needed for cough.   cetirizine (ZYRTEC) 10 MG tablet, Take 1 tablet (10 mg total) by mouth daily.   fluticasone (FLONASE) 50 MCG/ACT nasal spray, SPRAY 1 SPRAY INTO EACH NOSTRIL DAILY   ipratropium (ATROVENT) 0.06 % nasal spray, PLEASE SEE ATTACHED FOR DETAILED DIRECTIONS   promethazine (PHENERGAN) 25 MG tablet, Take 1 tablet (25 mg total)  by mouth every 6 (six) hours as needed for nausea or vomiting.  Current Outpatient Medications (Analgesics):    naproxen (NAPROSYN) 500 MG tablet, Take 1 tablet (500 mg total) by mouth 2 (two) times daily with a meal. Don't use for more than 3 days continuously.   traMADol (ULTRAM) 50 MG tablet, Take 1 tablet (50 mg total) by mouth every 4 (four) hours as needed.   acetaminophen (TYLENOL) 500 MG tablet, Take 1 tablet (500 mg total) by mouth every 6 (six) hours as needed. (Patient not taking: Reported on 12/03/2022)   SUMAtriptan (IMITREX) 50 MG tablet, Take  1 tablet (50 mg total) by mouth every 2 (two) hours as needed for up to 10 doses for migraine. May repeat in 2 hours if headache persists or recurs. (Patient not taking: Reported on 12/03/2022)   Current Outpatient Medications (Other):    ACCU-CHEK GUIDE test strip, USE ONE TEST STRIP TO CHECK BLOOD SUGARS ONCE A DAY AS NEEDED   Accu-Chek Softclix Lancets lancets, USE ONE LANCET TO CHECK BLOOD SUGAR ONE A DAY AS NEEDED   Blood Glucose Monitoring Suppl (ACCU-CHEK GUIDE ME) w/Device KIT, Dispense one device   [employer] D3 25 MCG (1000 UT) capsule, TAKE 1 CAPSULE BY MOUTH EVERY DAY   hydrocortisone (ANUSOL-HC) 2.5 % rectal cream, Place 1 Application rectally 2 (two) times daily.   hydrocortisone (ANUSOL-HC) 25 MG suppository, Place 1 suppository (25 mg total) rectally 2 (two) times daily.   lamoTRIgine (LAMICTAL) 100 MG tablet, Take 1 tablet (100 mg total) by mouth daily.   linaclotide (LINZESS) 290 MCG CAPS capsule, Take 1 capsule (290 mcg total) by mouth daily before breakfast.   meclizine (ANTIVERT) 25 MG tablet, Take 1 tablet (25 mg total) by mouth 3 (three) times daily as needed for dizziness.   megestrol (MEGACE) 40 MG tablet, Take 2 tablets (80 mg total) by mouth daily.   ondansetron (ZOFRAN-ODT) 8 MG disintegrating tablet, Take 1 tablet (8 mg total) by mouth every 8 (eight) hours as needed.   polyethylene glycol-electrolytes (NULYTELY) 420 g  solution, Do 1/2 of the prep day 1 and the other 1/2 day two   tiZANidine (ZANAFLEX) 4 MG tablet, Take 1 tablet (4 mg total) by mouth every 8 (eight) hours as needed for muscle spasms.   Brexpiprazole (REXULTI) 3 MG TABS, Take 1 tablet (3 mg total) by mouth daily. (Patient not taking: Reported on 12/03/2022)   clindamycin (CLINDAGEL) 1 % gel, APPLY TO AFFECTED AREA TWICE A DAY (Patient not taking: Reported on 12/03/2022)   dicyclomine (BENTYL) 10 MG capsule, Take 2 capsules (20 mg total) by mouth 3 (three) times daily as needed for spasms (abdominal pain).   doxepin (SINEQUAN) 25 MG capsule, Take 1 capsule (25 mg total) by mouth at bedtime. (Patient not taking: Reported on 12/03/2022)   famotidine (PEPCID) 20 MG tablet, Take 1 tablet (20 mg total) by mouth daily. (Patient not taking: Reported on 12/03/2022)   hydrOXYzine (ATARAX) 25 MG tablet, Take 1 tablet (25 mg total) by mouth every 6 (six) hours as needed. (Patient not taking: Reported on 12/03/2022)   omeprazole (PRILOSEC) 40 MG capsule, Take 1 capsule (40 mg total) by mouth 2 (two) times daily.   senna (SENOKOT) 8.6 MG TABS tablet, Take 2 tablets (17.2 mg total) by mouth 2 (two) times daily. (Patient not taking: Reported on 12/03/2022)  Medical History:  Past Medical History:  Diagnosis Date   Acid reflux    Anemia    Iron Def   Anorexia    CHF (congestive heart failure) (HCC)    Chronic kidney disease    Nephrotic syndrome   Colon polyp 2009   Depression with anxiety    Edema leg    Fibroid tumor 04/2022   Fibromyalgia    Hemorrhoids    Hidradenitis suppurativa    Hypertension    IBS (irritable bowel syndrome)    Low back pain    Migraines    Morbidly obese (HCC)    Neuromuscular disorder (HCC)    fibromyalgia   Neuropathy    Panic attacks    Polyp of vocal cord  or larynx    Stroke (HCC)    Tonsil pain    Allergies:  Allergies  Allergen Reactions   Lisinopril Rash and Cough     Surgical History:  She  has a past  surgical history that includes Hemorrhoid surgery; Upper gastrointestinal endoscopy; Axillary hidradenitis excision; Inguinal hidradenitis excision; Tonsillectomy (10/18/2010); Colonoscopy with propofol (N/A, 05/25/2015); and Colonoscopy with propofol (N/A, 12/31/2018). Family History:  Her family history includes Anxiety disorder in her mother and sister; Cancer in her father; Depression in her mother, sister, and sister; Diabetes in her mother; Heart disease in her father and mother; High blood pressure in her mother; Kidney failure in her mother; Learning disabilities in her sister.  REVIEW OF SYSTEMS  : All other systems reviewed and negative except where noted in the History of Present Illness.  PHYSICAL EXAM: BP 118/82   Pulse 92   Ht 5\' 10"  (1.778 m)   Wt (!) 312 lb 6 oz (141.7 kg)   BMI 44.82 kg/m  General Appearance: Well nourished, in no apparent distress. Head:   Normocephalic and atraumatic. Eyes:  sclerae anicteric,conjunctive pink  Respiratory: Respiratory effort normal, BS equal bilaterally without rales, rhonchi, wheezing. Cardio: RRR with no MRGs. Peripheral pulses intact.  Abdomen: Soft,  Obese ,active bowel sounds. mild tenderness in the RUQ and in the lower abdomen. Without guarding and Without rebound. No masses. RECTAL/ANOSCOPY: No fissures observed. No external hemorrhoids observed. Decreased rectal tone. Presence of large hemorrhoids and a large amount of very hard stool. Negative hemoccult.  Musculoskeletal: Full ROM, Normal gait. Without edema. Skin:  Dry and intact without significant lesions or rashes Neuro: Alert and  oriented x4;  No focal deficits. Psych:  Cooperative. Normal mood and affect.    Doree Albee, PA-C 10:02 AM

## 2022-12-03 ENCOUNTER — Encounter: Payer: Self-pay | Admitting: Physician Assistant

## 2022-12-03 ENCOUNTER — Ambulatory Visit (INDEPENDENT_AMBULATORY_CARE_PROVIDER_SITE_OTHER): Payer: 59 | Admitting: Physician Assistant

## 2022-12-03 ENCOUNTER — Encounter: Payer: Self-pay | Admitting: Internal Medicine

## 2022-12-03 ENCOUNTER — Telehealth: Payer: Self-pay

## 2022-12-03 ENCOUNTER — Other Ambulatory Visit (INDEPENDENT_AMBULATORY_CARE_PROVIDER_SITE_OTHER): Payer: 59

## 2022-12-03 VITALS — BP 118/82 | HR 92 | Ht 70.0 in | Wt 312.4 lb

## 2022-12-03 DIAGNOSIS — E538 Deficiency of other specified B group vitamins: Secondary | ICD-10-CM

## 2022-12-03 DIAGNOSIS — R112 Nausea with vomiting, unspecified: Secondary | ICD-10-CM

## 2022-12-03 DIAGNOSIS — K219 Gastro-esophageal reflux disease without esophagitis: Secondary | ICD-10-CM

## 2022-12-03 DIAGNOSIS — M797 Fibromyalgia: Secondary | ICD-10-CM

## 2022-12-03 DIAGNOSIS — K5904 Chronic idiopathic constipation: Secondary | ICD-10-CM | POA: Diagnosis not present

## 2022-12-03 DIAGNOSIS — R42 Dizziness and giddiness: Secondary | ICD-10-CM

## 2022-12-03 DIAGNOSIS — D649 Anemia, unspecified: Secondary | ICD-10-CM

## 2022-12-03 DIAGNOSIS — F1190 Opioid use, unspecified, uncomplicated: Secondary | ICD-10-CM

## 2022-12-03 DIAGNOSIS — K649 Unspecified hemorrhoids: Secondary | ICD-10-CM | POA: Diagnosis not present

## 2022-12-03 LAB — CBC WITH DIFFERENTIAL/PLATELET
Basophils Absolute: 0.1 10*3/uL (ref 0.0–0.1)
Basophils Relative: 0.6 % (ref 0.0–3.0)
Eosinophils Absolute: 0.1 10*3/uL (ref 0.0–0.7)
Eosinophils Relative: 1.6 % (ref 0.0–5.0)
HCT: 36.1 % (ref 36.0–46.0)
Hemoglobin: 11.3 g/dL — ABNORMAL LOW (ref 12.0–15.0)
Lymphocytes Relative: 25.9 % (ref 12.0–46.0)
Lymphs Abs: 2.4 10*3/uL (ref 0.7–4.0)
MCHC: 31.3 g/dL (ref 30.0–36.0)
MCV: 83.9 fL (ref 78.0–100.0)
Monocytes Absolute: 0.7 10*3/uL (ref 0.1–1.0)
Monocytes Relative: 7.7 % (ref 3.0–12.0)
Neutro Abs: 6 10*3/uL (ref 1.4–7.7)
Neutrophils Relative %: 64.2 % (ref 43.0–77.0)
Platelets: 408 10*3/uL — ABNORMAL HIGH (ref 150.0–400.0)
RBC: 4.3 Mil/uL (ref 3.87–5.11)
RDW: 14.4 % (ref 11.5–15.5)
WBC: 9.3 10*3/uL (ref 4.0–10.5)

## 2022-12-03 LAB — COMPREHENSIVE METABOLIC PANEL
ALT: 6 U/L (ref 0–35)
AST: 6 U/L (ref 0–37)
Albumin: 4.1 g/dL (ref 3.5–5.2)
Alkaline Phosphatase: 54 U/L (ref 39–117)
BUN: 14 mg/dL (ref 6–23)
CO2: 28 meq/L (ref 19–32)
Calcium: 9.2 mg/dL (ref 8.4–10.5)
Chloride: 105 meq/L (ref 96–112)
Creatinine, Ser: 1.02 mg/dL (ref 0.40–1.20)
GFR: 63.07 mL/min (ref >60.00)
Glucose, Bld: 94 mg/dL (ref 70–99)
Potassium: 3.9 meq/L (ref 3.5–5.1)
Sodium: 142 meq/L (ref 135–145)
Total Bilirubin: 0.4 mg/dL (ref 0.2–1.2)
Total Protein: 7.3 g/dL (ref 6.0–8.3)

## 2022-12-03 LAB — SEDIMENTATION RATE: Sed Rate: 48 mm/h — ABNORMAL HIGH (ref 0–30)

## 2022-12-03 LAB — IBC + FERRITIN
Ferritin: 86.1 ng/mL (ref 10.0–291.0)
Iron: 45 ug/dL (ref 42–145)
Saturation Ratios: 16.2 % — ABNORMAL LOW (ref 20.0–50.0)
TIBC: 278.6 ug/dL (ref 250.0–450.0)
Transferrin: 199 mg/dL — ABNORMAL LOW (ref 212.0–360.0)

## 2022-12-03 LAB — TSH: TSH: 0.1 u[IU]/mL — ABNORMAL LOW (ref 0.35–5.50)

## 2022-12-03 LAB — VITAMIN B12: Vitamin B-12: 446 pg/mL (ref 211–911)

## 2022-12-03 MED ORDER — DICYCLOMINE HCL 10 MG PO CAPS: 20.0000 mg | ORAL_CAPSULE | Freq: Three times a day (TID) | ORAL | 1 refills | Status: DC | PRN

## 2022-12-03 MED ORDER — LINACLOTIDE 290 MCG PO CAPS: 290.0000 ug | ORAL_CAPSULE | Freq: Every day | ORAL | 0 refills | Status: DC

## 2022-12-03 MED ORDER — HYDROCORTISONE ACETATE 25 MG RE SUPP: 25.0000 mg | Freq: Two times a day (BID) | RECTAL | 0 refills | Status: DC

## 2022-12-03 MED ORDER — PROMETHAZINE HCL 25 MG PO TABS: 25.0000 mg | ORAL_TABLET | Freq: Four times a day (QID) | ORAL | 0 refills | Status: DC | PRN

## 2022-12-03 MED ORDER — HYDROCORTISONE (PERIANAL) 2.5 % EX CREA: 1.0000 | TOPICAL_CREAM | Freq: Two times a day (BID) | CUTANEOUS | 2 refills | Status: DC

## 2022-12-03 MED ORDER — PEG 3350-KCL-NA BICARB-NACL 420 G PO SOLR: ORAL | 0 refills | Status: DC

## 2022-12-03 MED ORDER — OMEPRAZOLE 40 MG PO CPDR: 40.0000 mg | DELAYED_RELEASE_CAPSULE | Freq: Two times a day (BID) | ORAL | 3 refills | Status: DC

## 2022-12-03 NOTE — Telephone Encounter (Addendum)
Patient called she is scheduled to see you tomorrow,she stated she had a appointment with her GI doctor and she is supposed to start taking a supplement for her bowels. Patient wants to know if she can do a telehealth visit or if she reschedules her appointment for the next available which will be in January, is she able to get her medications refilled until she is seen then.

## 2022-12-03 NOTE — Patient Instructions (Addendum)
Your provider has requested that you go to the basement level for lab work before leaving today. Press "B" on the elevator. The lab is located at the first door on the left as you exit the elevator.  You have been scheduled for an abdominal ultrasound at [facility] (1st floor of hospital) on 12/09/2022 at 9:00am. Please arrive 15 minutes prior to your appointment for registration. Make certain not to have anything to eat or drink after midnight prior to your appointment. Should you need to reschedule your appointment, please contact radiology at [phone]. This test typically takes about 30 minutes to perform.  Take golytely: Do 6 oz every 30 mins until the impaction passes, can do 1/2 gallon on first day and 1/2 gallon on 2nd day.  Can continue 2 fleets enemas a day until it resolves.  Go to ER if any severe AB pain, vomiting/unable to hold down food/drink, blood in stool, or unable to pass gas.  AFTER the prep, can start on the linzess 290 mcg samples Linzess  *IBS-C patients may begin to experience relief from belly pain and overall abdominal symptoms (pain, discomfort, and bloating) in about 1 week,  with symptoms typically improving over 12 weeks.  Take at least 30 minutes before the first meal of the day on an empty stomach You can have a loose stool if you eat a high-fat breakfast. Give it at least 7 days, may have more bowel movements during that time.   The diarrhea should go away and you should start having normal, complete, full bowel movements.  It may be helpful to start treatment when you can be near the comfort of your own bathroom, such as a weekend.  After you are out we can send in a prescription if you did well, there is a prescription card  Please take your proton pump inhibitor medication, protonix 40 mg BID  Please take this medication 30 minutes to 1 hour before meals- this makes it more effective.  Avoid spicy and acidic foods Avoid fatty foods Limit  your intake of coffee, tea, alcohol, and carbonated drinks Work to maintain a healthy weight Keep the head of the bed elevated at least 3 inches with blocks or a wedge pillow if you are having any nighttime symptoms Stay upright for 2 hours after eating Avoid meals and snacks three to four hours before bedtime  Please do sitz baths- these can be found at the pharmacy. It is a Chief Operating Officer that is put in your toliet.  Please increase fiber or add benefiber, increase water and increase acitivity.  Will send in hydrocoritsone suppository, cheapest with GOODRX from [employer], [employer], [employer] or [employer] if your insurance does not pay for it. If the hemorrhoid suppository sent in is too expensive you can do this over the counter trick.  Apply a pea size amount of generic prescription Anusol HC cream that has been sent into your pharmacy to the tip of an over the counter PrepH suppository and insert rectally once every night for at least 7 nights.  If this does not improve there are procedures that can be done.   About Hemorrhoids  Hemorrhoids are swollen veins in the lower rectum and anus.  Also called piles, hemorrhoids are a common problem.  Hemorrhoids may be internal (inside the rectum) or external (around the anus).  Internal Hemorrhoids  Internal hemorrhoids are often painless, but they rarely cause bleeding.  The internal veins may stretch and fall down (prolapse) through the  anus to the outside of the body.  The veins may then become irritated and painful.  External Hemorrhoids  External hemorrhoids can be easily seen or felt around the anal opening.  They are under the skin around the anus.  When the swollen veins are scratched or broken by straining, rubbing or wiping they sometimes bleed.  How Hemorrhoids Occur  Veins in the rectum and around the anus tend to swell under pressure.  Hemorrhoids can result from increased pressure in the veins of your anus or rectum.  Some sources  of pressure are:  Straining to have a bowel movement because of constipation Waiting too long to have a bowel movement Coughing and sneezing often Sitting for extended periods of time, including on the toilet Diarrhea Obesity Trauma or injury to the anus Some liver diseases Stress Family history of hemorrhoids Pregnancy  Pregnant women should try to avoid becoming constipated, because they are more likely to have hemorrhoids during pregnancy.  In the last trimester of pregnancy, the enlarged uterus may press on blood vessels and causes hemorrhoids.  In addition, the strain of childbirth sometimes causes hemorrhoids after the birth.  Symptoms of Hemorrhoids  Some symptoms of hemorrhoids include: Swelling and/or a tender lump around the anus Itching, mild burning and bleeding around the anus Painful bowel movements with or without constipation Bright red blood covering the stool, on toilet paper or in the toilet bowel.   Symptoms usually go away within a few days.  Always talk to your doctor about any bleeding to make sure it is not from some other causes.  Diagnosing and Treating Hemorrhoids  Diagnosis is made by an examination by your healthcare provider.  Special test can be performed by your doctor.    Most cases of hemorrhoids can be treated with: High-fiber diet: Eat more high-fiber foods, which help prevent constipation.  Ask for more detailed fiber information on types and sources of fiber from your healthcare provider. Fluids: Drink plenty of water.  This helps soften bowel movements so they are easier to pass. Sitz baths and cold packs: Sitting in lukewarm water two or three times a day for 15 minutes cleases the anal area and may relieve discomfort.  If the water is too hot, swelling around the anus will get worse.  Placing a cloth-covered ice pack on the anus for ten minutes four times a day can also help reduce selling.  Gently pushing a prolapsed hemorrhoid back inside  after the bath or ice pack can be helpful. Medications: For mild discomfort, your healthcare provider may suggest over-the-counter pain medication or prescribe a cream or ointment for topical use.  The cream may contain witch hazel, zinc oxide or petroleum jelly.  Medicated suppositories are also a treatment option.  Always consult your doctor before applying medications or creams. Procedures and surgeries: There are also a number of procedures and surgeries to shrink or remove hemorrhoids in more serious cases.  Talk to your physician about these options.  You can often prevent hemorrhoids or keep them from becoming worse by maintaining a healthy lifestyle.  Eat a fiber-rich diet of fruits, vegetables and whole grains.  Also, drink plenty of water and exercise regularly.   2007, [employer] Doc.30  Gastroparesis Please do small frequent meals like 4-6 meals a day.  Eat and drink liquids at separate times.  Avoid high fiber foods, cook your vegetables, avoid high fat food.  Suggest spreading protein throughout the day (greek yogurt, glucerna, soft meat, milk,  eggs) Choose soft foods that you can mash with a fork When you are more symptomatic, change to pureed foods foods and liquids.  Consider reading "Living well with Gastroparesis" by Reuel Derby Gastroparesis is a condition in which food takes longer than normal to empty from the stomach. This condition is also known as delayed gastric emptying. It is usually a long-term (chronic) condition. There is no cure, but there are treatments and things that you can do at home to help relieve symptoms. Treating the underlying condition that causes gastroparesis can also help relieve symptoms What are the causes? In many cases, the cause of this condition is not known. Possible causes include: A hormone (endocrine) disorder, such as hypothyroidism or diabetes. A nervous system disease, such as Parkinson's disease or multiple  sclerosis. Cancer, infection, or surgery that affects the stomach or vagus nerve. The vagus nerve runs from your chest, through your neck, and to the lower part of your brain. A connective tissue disorder, such as scleroderma. Certain medicines. What increases the risk? You are more likely to develop this condition if: You have certain disorders or diseases. These may include: An endocrine disorder. An eating disorder. Amyloidosis. Scleroderma. Parkinson's disease. Multiple sclerosis. Cancer or infection of the stomach or the vagus nerve. You have had surgery on your stomach or vagus nerve. You take certain medicines. You are female. What are the signs or symptoms? Symptoms of this condition include: Feeling full after eating very little or a loss of appetite. Nausea, vomiting, or heartburn. Bloating of your abdomen. Inconsistent blood sugar (glucose) levels on blood tests. Unexplained weight loss. Acid from the stomach coming up into the esophagus (gastroesophageal reflux). Sudden tightening (spasm) of the stomach, which can be painful. Symptoms may come and go. Some people may not notice any symptoms. How is this diagnosed? This condition is diagnosed with tests, such as: Tests that check how long it takes food to move through the stomach and intestines. These tests include: Upper gastrointestinal (GI) series. For this test, you drink a liquid that shows up well on X-rays, and then X-rays are taken of your intestines. Gastric emptying scintigraphy. For this test, you eat food that contains a small amount of radioactive material, and then scans are taken. Wireless capsule GI monitoring system. For this test, you swallow a pill (capsule) that records information about how foods and fluid move through your stomach. Gastric manometry. For this test, a tube is passed down your throat and into your stomach to measure electrical and muscular activity. Endoscopy. For this test, a long,  thin tube with a camera and light on the end is passed down your throat and into your stomach to check for problems in your stomach lining. Ultrasound. This test uses sound waves to create images of the inside of your body. This can help rule out gallbladder disease or pancreatitis as a cause of your symptoms. How is this treated? There is no cure for this condition, but treatment and home care may relieve symptoms. Treatment may include: Treating the underlying cause. Managing your symptoms by making changes to your diet and exercise habits. Taking medicines to control nausea and vomiting and to stimulate stomach muscles. Getting food through a feeding tube in the hospital. This may be done in severe cases. Having surgery to insert a device called a gastric electrical stimulator into your body. This device helps improve stomach emptying and control nausea and vomiting. Follow these instructions at home: Take over-the-counter and prescription medicines only as  told by your health care provider. Follow instructions from your health care provider about eating or drinking restrictions. Your health care provider may recommend that you: Eat smaller meals more often. Eat low-fat foods. Eat low-fiber forms of high-fiber foods. For example, eat cooked vegetables instead of raw vegetables. Have only liquid foods instead of solid foods. Liquid foods are easier to digest. Drink enough fluid to keep your urine pale yellow. Exercise as often as told by your health care provider. Keep all follow-up visits. This is important. Contact a health care provider if you: Notice that your symptoms do not improve with treatment. Have new symptoms. Get help right away if you: Have severe pain in your abdomen that does not improve with treatment. Have nausea that is severe or does not go away. Vomit every time you drink fluids. Summary Gastroparesis is a long-term (chronic) condition in which food takes longer than  normal to empty from the stomach. Symptoms include nausea, vomiting, heartburn, bloating of your abdomen, and loss of appetite. Eating smaller portions, low-fat foods, and low-fiber forms of high-fiber foods may help you manage your symptoms. Get help right away if you have severe pain in your abdomen. This information is not intended to replace advice given to you by your health care provider. Make sure you discuss any questions you have with your health care provider. Document Revised: 05/24/2019 Document Reviewed: 05/24/2019 Elsevier Patient Education  2021 [employer].

## 2022-12-03 NOTE — Progress Notes (Signed)
I agree with the assessment and plan as outlined by Ms. Collier. 

## 2022-12-04 ENCOUNTER — Encounter: Payer: 59 | Admitting: Internal Medicine

## 2022-12-04 MED ORDER — TRAMADOL HCL 50 MG PO TABS
50.0000 mg | ORAL_TABLET | Freq: Four times a day (QID) | ORAL | 0 refills | Status: DC | PRN
Start: 2022-12-04 — End: 2023-02-07

## 2022-12-04 MED ORDER — MECLIZINE HCL 25 MG PO TABS
25.0000 mg | ORAL_TABLET | Freq: Three times a day (TID) | ORAL | 2 refills | Status: DC | PRN
Start: 2022-12-04 — End: 2023-01-14

## 2022-12-04 NOTE — Telephone Encounter (Signed)
Called patient and advised her that her medications have been refilled as requested and that she can get the anorectal lidocaine otc.

## 2022-12-04 NOTE — Telephone Encounter (Signed)
Ok to reschedule with me; please send me the meds she needs refilled

## 2022-12-04 NOTE — Telephone Encounter (Signed)
Approved tramadol and meclizine. Decreased tramadol dose to q6h PRN as we discussed at her last visit. Anorectal lidocaine should be over the counter.

## 2022-12-04 NOTE — Telephone Encounter (Signed)
Patient has been rescheduled to 1/15, she is requesting medication refills for Tramadol,Meclizine and Lidocaine Anorectal.

## 2022-12-09 ENCOUNTER — Ambulatory Visit (HOSPITAL_COMMUNITY): Payer: 59

## 2022-12-12 ENCOUNTER — Other Ambulatory Visit: Payer: Self-pay | Admitting: Family Medicine

## 2022-12-12 DIAGNOSIS — N921 Excessive and frequent menstruation with irregular cycle: Secondary | ICD-10-CM

## 2022-12-13 ENCOUNTER — Ambulatory Visit (INDEPENDENT_AMBULATORY_CARE_PROVIDER_SITE_OTHER): Payer: 59 | Admitting: Licensed Clinical Social Worker

## 2022-12-13 DIAGNOSIS — F410 Panic disorder [episodic paroxysmal anxiety] without agoraphobia: Secondary | ICD-10-CM | POA: Diagnosis not present

## 2022-12-13 DIAGNOSIS — F411 Generalized anxiety disorder: Secondary | ICD-10-CM

## 2022-12-13 DIAGNOSIS — F4312 Post-traumatic stress disorder, chronic: Secondary | ICD-10-CM | POA: Diagnosis not present

## 2022-12-13 DIAGNOSIS — F331 Major depressive disorder, recurrent, moderate: Secondary | ICD-10-CM

## 2022-12-13 NOTE — Progress Notes (Signed)
Virtual Visit via Video Note  I connected with Lindsay [name] on 12/13/22 at  8:00 AM EST by a video enabled telemedicine application and verified that I am speaking with the correct person using two identifiers.  Location: Patient: home Provider: home office   I discussed the limitations of evaluation and management by telemedicine and the availability of in person appointments. The patient expressed understanding and agreed to proceed.   I discussed the assessment and treatment plan with the patient. The patient was provided an opportunity to ask questions and all were answered. The patient agreed with the plan and demonstrated an understanding of the instructions.   The patient was advised to call back or seek an in-person evaluation if the symptoms worsen or if the condition fails to improve as anticipated.  I provided 47 minutes of non-face-to-face time during this encounter.  THERAPIST PROGRESS NOTE  Session Time: 8:00 AM to 8:47 AM  Participation Level: Active  Behavioral Response: CasualAlertDysphoric  Type of Therapy: Individual Therapy  Treatment Goals addressed: patient continue to utilize therapy for  supportive and strength-based interventions, provide treatment interventions in the context of patient continuing to seek help and cope with medical issues, anxiety, triggers, coping with mental health symptoms, trauma, utilize therapy as a way for patient to focus on working through current stressors that also helps to distract from pain ProgressTowards Goals: Progressing-patient has ongoing pain issues impacts mental health helpful for coping to have therapy sessions Interventions: Solution Focused, Strength-based, Supportive, and Other: coping  Summary: Lindsay [name] is a 52 y.o. female who presents with haven't been sleep all night in a lot of pain. When talk to Dr. Lolly Mustache tell him help with sleeping, her doctor decreased Tramadol from 4 to every 6 hours. Had to go do GI did  a prep for colonoscopy has only done the prep clean up system a lot of back up next year colonoscopy. Went on Tuesday, supposed to go doctor's Wednesday but doing the prep. Feels pain in knee something ripping apart and back. Lose weight and then knee replacement.  Therapist asked if patient is struggling with anything and patient says "Life is lifying." Same stuff a different day. Therapist pointed out once in awhile some positive. Regular stuff that enjoys. Sometimes different movies stream on different platforms. Watched videos comedians pull up on Youtube different stand ups.  Patient talked about issues with sweat really badly and in the store walking and stomach sweating really bad when go in store anxiety level gets really bad.  Therapist notes she fell could be a trigger before fell August July and June and was sweating. Doesn't know what triggered that. Try to get groceries and get out. The store manager turned around look at him in face increased anxiety already fearing that was going to happen. Every time go to store think going to sweat and 9 out of 10 does and then the manager comes around when sweating and looks at her. 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 Doesn't know why do it connected dots not doing it until see him. Happens when goes to doctor's office. Every since pain management spine place all they did was put film on tongue didn't do anything. Said can't do anything doesn't have time for her too much going on. Didn't say anything because he was "being a bastard".  Therapist noted instances of this happening to other people not such an unusual situation do not feel only  her even though it feels bad not good way of treating people being them the care they need  Reviewed treatment plan patient gave consent to complete virtually assess even in this session patient continues to have medical issues helpful for her to talk about.  Helpful as well  for distraction therapist guiding patient talking about some of the positives in her life that we enjoy patient talking about watching comedian's therapist enjoys that 2.  Patient talked about anxiety and stores could recognize triggers also happens in doctors offices.  Therapist notes she does not get out as much so more hypersensitive patient agrees.  Also doctors can be hot he patient has experience of that happening notes that is possibly source of triggers at the pain management office therapist normalized that experience talk to other patients that happens to.  Talked about the manager who seems to be a trigger therapist talked about how she copes tells herself do not pay attention do not think about it that she is not anybody else's business for them to be concerned about so combination of self talk and refocusing.  Noticed problem solving is well patient going to talk to Dr. About sleeping meds.     Suicidal/Homicidal: No  Plan: Return again in 2 weeks.2.Assess very helpful for patient to get supportive interventions process thoughts and feelings in session  Diagnosis: Major depressive disorder, recurrent, moderate, generalized anxiety disorder, chronic PTSD, panic attacks  Collaboration of Care: Other none needed  Patient/Guardian was advised Release of Information must be obtained prior to any record release in order to collaborate their care with an outside provider. Patient/Guardian was advised if they have not already done so to contact the registration department to sign all necessary forms in order for [name] to release information regarding their care.   Consent: Patient/Guardian gives verbal consent for treatment and assignment of benefits for services provided during this visit. Patient/Guardian expressed understanding and agreed to proceed.   Coolidge Breeze, LCSW 12/13/2022

## 2022-12-17 ENCOUNTER — Telehealth: Payer: Self-pay

## 2022-12-17 NOTE — Patient Outreach (Signed)
Attempted to contact patient regarding MM, DM eye. Left voicemail for patient to return my call at [phone].  Nicholes Rough, CMA Care Guide [employer]

## 2022-12-21 ENCOUNTER — Other Ambulatory Visit: Payer: Self-pay | Admitting: Student

## 2022-12-21 ENCOUNTER — Other Ambulatory Visit (HOSPITAL_COMMUNITY): Payer: Self-pay | Admitting: Psychiatry

## 2022-12-21 DIAGNOSIS — F331 Major depressive disorder, recurrent, moderate: Secondary | ICD-10-CM

## 2022-12-21 DIAGNOSIS — F4312 Post-traumatic stress disorder, chronic: Secondary | ICD-10-CM

## 2022-12-22 ENCOUNTER — Other Ambulatory Visit (HOSPITAL_COMMUNITY): Payer: Self-pay | Admitting: Psychiatry

## 2022-12-22 DIAGNOSIS — F331 Major depressive disorder, recurrent, moderate: Secondary | ICD-10-CM

## 2022-12-23 ENCOUNTER — Other Ambulatory Visit: Payer: Self-pay | Admitting: Internal Medicine

## 2022-12-23 DIAGNOSIS — M797 Fibromyalgia: Secondary | ICD-10-CM

## 2022-12-23 NOTE — Telephone Encounter (Signed)
Declined mobic rx; she just refilled her naproxen. Please tell her not to mix NSAIDs.

## 2022-12-24 ENCOUNTER — Encounter (HOSPITAL_COMMUNITY): Payer: Self-pay | Admitting: Psychiatry

## 2022-12-24 ENCOUNTER — Telehealth (HOSPITAL_COMMUNITY): Payer: 59 | Admitting: Psychiatry

## 2022-12-24 VITALS — Wt 312.0 lb

## 2022-12-24 DIAGNOSIS — F4312 Post-traumatic stress disorder, chronic: Secondary | ICD-10-CM | POA: Diagnosis not present

## 2022-12-24 DIAGNOSIS — F331 Major depressive disorder, recurrent, moderate: Secondary | ICD-10-CM

## 2022-12-24 MED ORDER — REXULTI 3 MG PO TABS: 3.0000 mg | ORAL_TABLET | Freq: Every day | ORAL | 2 refills | Status: DC

## 2022-12-24 MED ORDER — LAMOTRIGINE 100 MG PO TABS: 100.0000 mg | ORAL_TABLET | Freq: Every day | ORAL | 2 refills | Status: DC

## 2022-12-24 MED ORDER — DOXEPIN HCL 25 MG PO CAPS: 25.0000 mg | ORAL_CAPSULE | Freq: Every day | ORAL | 2 refills | Status: DC
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
## 2022-12-25 ENCOUNTER — Ambulatory Visit (HOSPITAL_COMMUNITY): Payer: 59 | Admitting: Licensed Clinical Social Worker

## 2022-12-25 DIAGNOSIS — F411 Generalized anxiety disorder: Secondary | ICD-10-CM | POA: Diagnosis not present

## 2022-12-25 DIAGNOSIS — F331 Major depressive disorder, recurrent, moderate: Secondary | ICD-10-CM | POA: Diagnosis not present

## 2022-12-25 DIAGNOSIS — F4312 Post-traumatic stress disorder, chronic: Secondary | ICD-10-CM | POA: Diagnosis not present

## 2022-12-25 DIAGNOSIS — F410 Panic disorder [episodic paroxysmal anxiety] without agoraphobia: Secondary | ICD-10-CM | POA: Diagnosis not present

## 2022-12-25 NOTE — Progress Notes (Signed)
Virtual Visit via Video Note  I connected with Lindsay [name] on 12/25/22 at  8:00 AM EST by a video enabled telemedicine application and verified that I am speaking with the correct person using two identifiers.  Location: Patient: home Provider: home office   I discussed the limitations of evaluation and management by telemedicine and the availability of in person appointments. The patient expressed understanding and agreed to proceed.   I discussed the assessment and treatment plan with the patient. The patient was provided an opportunity to ask questions and all were answered. The patient agreed with the plan and demonstrated an understanding of the instructions.   The patient was advised to call back or seek an in-person evaluation if the symptoms worsen or if the condition fails to improve as anticipated.  I provided 20 minutes of non-face-to-face time during this encounter.  THERAPIST PROGRESS NOTE  Session Time: 8:00 AM to 8:20 AM   Participation Level: Active  Behavioral Response: CasualAlertappropriate  Type of Therapy: Individual Therapy  Treatment Goals addressed: patient continue to utilize therapy for  supportive and strength-based interventions, provide treatment interventions in the context of patient continuing to seek help and cope with medical issues, anxiety, triggers, coping with mental health symptoms, trauma, utilize therapy as a way for patient to focus on working through current stressors that also helps to distract from pain  ProgressTowards Goals: Progressing-patient stays connected to therapy for supportive strength-based interventions helpful with managing her stressors  Interventions: Solution Focused, Biofeedback, Supportive, and Other: coping  Summary: Lindsay [name] is a 52 y.o. female who presents with doing same. Still dealing with pain knee and last couple of days back hurting. Pain not being effectively managed.  Therapist reviewed she had looked at  doctors note and that was mentioned.  Reviewed other things from his note.  Session was shorter out of conversation as therapist was not feeling well.  Provider use empathetic listening direct questioning discussed current underlying problems.  Therapist and patient wish each as are good holidays.  Suicidal/Homicidal: No  Plan: Return again in 2 weeks.2.Assess very helpful for patient to get supportive interventions process thoughts and feelings in session  Diagnosis: Major depressive disorder, recurrent, moderate, generalized anxiety disorder, chronic PTSD, panic attacks  Collaboration of Care: Medication Management AEB review of Dr. Lolly Mustache note  Patient/Guardian was advised Release of Information must be obtained prior to any record release in order to collaborate their care with an outside provider. Patient/Guardian was advised if they have not already done so to contact the registration department to sign all necessary forms in order for [name] to release information regarding their care.   Consent: Patient/Guardian gives verbal consent for treatment and assignment of benefits for services provided during this visit. Patient/Guardian expressed understanding and agreed to proceed.   Coolidge Breeze, LCSW 12/25/2022

## 2022-12-30 DIAGNOSIS — S92355A Nondisplaced fracture of fifth metatarsal bone, left foot, initial encounter for closed fracture: Secondary | ICD-10-CM | POA: Diagnosis not present

## 2023-01-04 ENCOUNTER — Other Ambulatory Visit: Payer: Self-pay | Admitting: Family Medicine

## 2023-01-04 ENCOUNTER — Other Ambulatory Visit: Payer: Self-pay | Admitting: Physician Assistant

## 2023-01-04 DIAGNOSIS — R102 Pelvic and perineal pain: Secondary | ICD-10-CM

## 2023-01-05 ENCOUNTER — Telehealth: Payer: Self-pay | Admitting: Nurse Practitioner

## 2023-01-05 DIAGNOSIS — K5904 Chronic idiopathic constipation: Secondary | ICD-10-CM

## 2023-01-05 MED ORDER — DICYCLOMINE HCL 10 MG PO CAPS: 10.0000 mg | ORAL_CAPSULE | Freq: Three times a day (TID) | ORAL | 0 refills | Status: DC | PRN

## 2023-01-05 MED ORDER — PROMETHAZINE HCL 12.5 MG PO TABS: 12.5000 mg | ORAL_TABLET | Freq: Two times a day (BID) | ORAL | 0 refills | Status: DC | PRN

## 2023-01-05 NOTE — Telephone Encounter (Signed)
Patient called our weekend on call GI service.  Text message documented patient had severe stomach pain.  I called the patient and she stated she was not having severe stomach pain but reported having pain around her bellybutton to her lower abdomen which is not severe and is similar to the pain she is experienced in the past for which she takes dicyclomine as needed.  She requests a refill of dicyclomine because she ran out of this medication.  She has intermittent recurrent nausea without vomiting and she requested a prescription for Phenergan.  She is tolerating fluids.  Not constipated at this time.  I advised the patient to go to the emergency room if her abdominal pain or nausea worsens and she agreed to do so.  I sent a prescription for dicyclomine 10 mg 1 tab p.o. every 8 hours as needed abdominal pain and Phenergan 12.5 mg 1 tab p.o. twice daily as needed.  I will forward this message to Quentin Mulling, PA-C to contact patient Monday, 01/06/2023 for symptom update and to schedule a follow-up appointment.

## 2023-01-06 NOTE — Progress Notes (Unsigned)
01/06/2023 [name] [id number] [birth year]  Referring provider: [name], MD Primary GI doctor:Dr. Leonides Schanz (Dr. Christella Hartigan)  ASSESSMENT AND PLAN:   Anemia New onset, no menstrual bleeding, some rectal bleeding since July, history of B12 def not on medications -Order labs to investigate cause of anemia. - If iron def, may need to consider EGD/colon  Rectal Bleeding Ongoing since July, associated with mucus in stool and rectal pain, constipation. History of hemorrhoidectomy 15 years ago. Hemorrhoids noted on exam -Sitz baths, increase fiber, increase water -Hydrocortisone supp given and external cream sent in.  -We discussed hemorrhoid banding here in the office for internal hemorrhoids if not improving with conservative treatment. - follow up for evaluation here in the office.   Constipation with large hard stool/impaction on exam Colon 2020 tics, otherwise normal, recall 2027 Bowel movements every 3-4 days, straining with bowel movements, hard stool noted on rectal exam. Patient on tramadol and Ozempic, both of which can contribute to constipation. Miralax not helping -Order Golytely and advise patient to take first half on day 1 and second half on day 2, along with enemas, to address fecal impaction. -Start Linzess after bowel prep to help with bowel movements and abdominal discomfort.  Gastroesophageal Reflux Disease (GERD) with nausea and vomiting Daily symptoms, possibly related to Ozempic. Patient on Prilosec, but still experiencing symptoms. -Increase Prilosec to 40mg  twice daily. - stop NSAIDS -Switch from Zofran to Promethazine for symptom control. -Provide gastroparesis diet information and consider endoscopy depending on lab results. Discussed GLP1 with the patient, mechanism of action and how this can worsen and/or cause nausea by causing gastroparesis.   Discuss with primary care see about potentially getting off this medication.  Follow-up Close  follow-up recommended due to multiple ongoing issues. Patient to message or call if any changes occur.   Patient Care Team: [name], MD as PCP - General (Internal Medicine) Rennis Golden Lisette Abu, MD as PCP - Cardiology (Cardiology)  HISTORY OF PRESENT ILLNESS: 52 y.o. female with a past medical history of HTN, COPD, OSA, HTN, heart failure, diabetes, IBS-C, and others listed below presents for evaluation of   Review of pertinent gastrointestinal problems: 1. Adenomatous polyps in her colon;   colonoscopy in 07/2007 with one polyp that was removed and was a hyperplastic polyp; also had hemorrhoids.  Colonoscopy  04/2015 Dr. Christella Hartigan (minor rectal bleeding); this was an incomplete colonoscopy 2 to BMI 50, I was only able to get to the ascending colon; 4 polyps were removed, 3 of them were adenomatous.  My office arranged barium enema but she never had that done. Colonoscopy 12/31/2018 with Dr. Christella Hartigan good bowel prep diverticulosis left colon no polyps or cancers recall 7 years 2.  Dyspepsia led to upper endoscopy May 2012.  Minor gastritis was noted, biopsies showed no sign of H. Pylori. Ultrasound June 2012 was normal.   HIDA scan June 2012 was also normal.   CT scan August 2012 was essentially normal except for "prominent stool"  08/10/2022 CT abdomen pelvis with contrast for abdominal pain shows mild diverticulosis without diverticulitis Initial CT and pelvis with right lower quadrant abdominal pain showed slightly enlarged appendix without any acute inflammation, suggested repeat 12 to 24 hours 08/10/2022 repeat CT abdomen pelvis with contrast for right lower quadrant abdominal pain showed prominent stool throughout the colon favors constipation, normal appendix, suspected 2.1 cm right uterine fibroid and aortic atherosclerosis  08/19/2022 labs reviewed show hemoglobin 10.6, has had 3 months of normocytic anemia MCV 86.1, platelets  484, normal liver and kidney 09/25/2022 internal medicine visit  with her primary care after multiple falls leading to ER visits, continue abdominal pain low-grade fever.  Patient was being tapered off tramadol for fear of serotonergic effect.  She is going from tramadol 50 mg every 4 hours every 6 hours Patient is complaining of rectal bleeding had history of hemorrhoidectomy with hemorrhoids noted on exam given suppository and discussed possible banding in the office.  Patient had normal colonoscopy 2020 with diverticulosis recall 2027.  Having bowel movement every 3 to 4 days worsening with tramadol and Ozempic MiraLAX not helping given GoLytely and enemas to address fecal impaction appreciated on exam.  Started Linzess after bowel movements.  12/03/2022 office visit with myself for new onset anemia, patient had normal iron ferritin slight decreased saturation ratio we check iron ferritin.  She had normal B12, kidney liver.  Thyroid was in hyperthyroidism range with TSH 0.10 suggest following up with primary care.  Sed rate 48. Will schedule for right upper quadrant ultrasound of this was not done. 01/05/2023 patient called weekend on call GI service and spoke with Alcide Evener, NP continues to is having severe stomach pain bellybutton and lower abdomen and was out of dicyclomine requesting refill.  Said she was not constipated at the time advised to go to the ER if any worsening symptoms  Discussed the use of AI scribe software for clinical note transcription with the patient, who gave verbal consent to proceed.  The patient, with a history of diabetes and anemia, presents with ongoing rectal bleeding since July. She reports passing a significant amount of mucus with her stool, which is often accompanied by rectal pain. The bleeding is described as small volume, noted on the stool and toilet paper. The patient also reports a history of hemorrhoids, for which she underwent a procedure approximately 15 years ago.  The patient has been experiencing  constipation, with bowel movements occurring once every three to four days. She describes straining during bowel movements and a sensation of incomplete evacuation. She has tried various over-the-counter remedies, including Miralax and Senokot, without significant relief.  In addition to these symptoms, the patient reports ongoing nausea and vomiting, which occur daily and are exacerbated by eating. She has been managing these symptoms with Zofran, but reports that it is no longer effective. She denies dysphagia but reports a sensation of fullness and discomfort in the abdomen.  The patient has been on Ozempic for her diabetes for approximately a year. She reports weight loss since starting this medication, but denies any worsening of her gastrointestinal symptoms. She also takes tramadol for pain and has recently started on naproxen. She has been on Prilosec for reflux and heartburn, which she takes daily, and dicyclomine for abdominal pain. She reports some relief with the dicyclomine.   She  reports that she quit smoking about 3 years ago. Her smoking use included cigarettes. She started smoking about 28 years ago. She has a 2.5 pack-year smoking history. She has never used smokeless tobacco. She reports that she does not currently use alcohol. She reports that she does not currently use drugs after having used the following drugs: Marijuana. Frequency: 3.00 times per week.  RELEVANT LABS AND IMAGING:  Results          CBC    Component Value Date/Time   WBC 9.3 12/03/2022 1014   RBC 4.30 12/03/2022 1014   HGB 11.3 (L) 12/03/2022 1014   HGB 11.4 08/07/2022 1024  HCT 36.1 12/03/2022 1014   HCT 34.5 08/07/2022 1024   PLT 408.0 (H) 12/03/2022 1014   PLT 421 08/07/2022 1024   MCV 83.9 12/03/2022 1014   MCV 83 08/07/2022 1024   MCH 26.2 08/19/2022 1027   MCHC 31.3 12/03/2022 1014   RDW 14.4 12/03/2022 1014   RDW 14.3 08/07/2022 1024   LYMPHSABS 2.4 12/03/2022 1014   LYMPHSABS 2.0  08/07/2022 1024   MONOABS 0.7 12/03/2022 1014   EOSABS 0.1 12/03/2022 1014   EOSABS 0.2 08/07/2022 1024   BASOSABS 0.1 12/03/2022 1014   BASOSABS 0.0 08/07/2022 1024   Recent Labs    04/23/22 1550 04/29/22 0958 05/16/22 0852 05/16/22 0858 08/07/22 1024 08/09/22 2024 08/10/22 0820 08/11/22 0223 08/19/22 1027 12/03/22 1014  HGB 12.2 13.1 11.6* 12.2 11.4 10.1* 10.9* 9.3* 10.6* 11.3*    CMP     Component Value Date/Time   NA 142 12/03/2022 1014   NA 142 08/07/2022 1024   K 3.9 12/03/2022 1014   CL 105 12/03/2022 1014   CO2 28 12/03/2022 1014   GLUCOSE 94 12/03/2022 1014   BUN 14 12/03/2022 1014   BUN 12 08/07/2022 1024   CREATININE 1.02 12/03/2022 1014   CREATININE 0.88 09/15/2013 1032   CALCIUM 9.2 12/03/2022 1014   PROT 7.3 12/03/2022 1014   PROT 6.7 08/07/2022 1024   ALBUMIN 4.1 12/03/2022 1014   ALBUMIN 4.3 08/07/2022 1024   AST 6 12/03/2022 1014   ALT 6 12/03/2022 1014   ALKPHOS 54 12/03/2022 1014   BILITOT 0.4 12/03/2022 1014   BILITOT <0.2 08/07/2022 1024   GFRNONAA >60 08/19/2022 1027   GFRAA 67 02/02/2020 0933      Latest Ref Rng & Units 12/03/2022   10:14 AM 08/19/2022   10:27 AM 08/11/2022    2:23 AM  Hepatic Function  Total Protein 6.0 - 8.3 g/dL 7.3  6.7  5.7   Albumin 3.5 - 5.2 g/dL 4.1  3.5  3.0   AST 0 - 37 U/L 6  12  9    ALT 0 - 35 U/L 6  10  10    Alk Phosphatase 39 - 117 U/L 54  67  52   Total Bilirubin 0.2 - 1.2 mg/dL 0.4  0.2  0.2       Current Medications:   Current Outpatient Medications (Endocrine & Metabolic):    Semaglutide, 2 MG/DOSE, (OZEMPIC, 2 MG/DOSE,) 8 MG/3ML SOPN, Inject 2 mg into the skin once a week.  Current Outpatient Medications (Cardiovascular):    atorvastatin (LIPITOR) 40 MG tablet, TAKE 1 TABLET BY MOUTH EVERY DAY   metoprolol succinate (TOPROL-XL) 50 MG 24 hr tablet, Take 1 tablet (50 mg total) by mouth daily.   torsemide (DEMADEX) 20 MG tablet, Take 2 tablets (40 mg total) by mouth 2 (two) times  daily.  Current Outpatient Medications (Respiratory):    albuterol (PROVENTIL) (2.5 MG/3ML) 0.083% nebulizer solution, Take 3 mLs (2.5 mg total) by nebulization every 6 (six) hours as needed for wheezing or shortness of breath.   albuterol (VENTOLIN HFA) 108 (90 Base) MCG/ACT inhaler, TAKE 2 PUFFS BY MOUTH EVERY 6 HOURS AS NEEDED FOR WHEEZE OR SHORTNESS OF BREATH   azelastine (ASTELIN) 0.1 % nasal spray, Place 1 spray into both nostrils 2 (two) times daily. Use in each nostril as directed   benzonatate (TESSALON PERLES) 100 MG capsule, Take 1 capsule (100 mg total) by mouth every 6 (six) hours as needed for cough.   cetirizine (ZYRTEC) 10 MG tablet, Take  1 tablet (10 mg total) by mouth daily.   fluticasone (FLONASE) 50 MCG/ACT nasal spray, SPRAY 1 SPRAY INTO EACH NOSTRIL DAILY   ipratropium (ATROVENT) 0.06 % nasal spray, PLEASE SEE ATTACHED FOR DETAILED DIRECTIONS   promethazine (PHENERGAN) 12.5 MG tablet, Take 1 tablet (12.5 mg total) by mouth every 12 (twelve) hours as needed for nausea or vomiting.   promethazine (PHENERGAN) 25 MG tablet, TAKE 1 TABLET BY MOUTH EVERY 6 HOURS AS NEEDED FOR NAUSEA OR VOMITING.  Current Outpatient Medications (Analgesics):    acetaminophen (TYLENOL) 500 MG tablet, Take 1 tablet (500 mg total) by mouth every 6 (six) hours as needed. (Patient not taking: Reported on 12/03/2022)   naproxen (NAPROSYN) 500 MG tablet, TAKE 1 TABLET (500 MG TOTAL) BY MOUTH 2 (TWO) TIMES DAILY WITH A MEAL. DON'T USE FOR MORE THAN 3 DAYS CONTINUOUSLY.   SUMAtriptan (IMITREX) 50 MG tablet, Take 1 tablet (50 mg total) by mouth every 2 (two) hours as needed for up to 10 doses for migraine. May repeat in 2 hours if headache persists or recurs. (Patient not taking: Reported on 12/03/2022)   traMADol (ULTRAM) 50 MG tablet, Take 1 tablet (50 mg total) by mouth every 6 (six) hours as needed.   Current Outpatient Medications (Other):    ACCU-CHEK GUIDE test strip, USE ONE TEST STRIP TO CHECK BLOOD  SUGARS ONCE A DAY AS NEEDED   Accu-Chek Softclix Lancets lancets, USE ONE LANCET TO CHECK BLOOD SUGAR ONE A DAY AS NEEDED   Blood Glucose Monitoring Suppl (ACCU-CHEK GUIDE ME) w/Device KIT, Dispense one device   Brexpiprazole (REXULTI) 3 MG TABS, Take 1 tablet (3 mg total) by mouth daily.   clindamycin (CLINDAGEL) 1 % gel, APPLY TO AFFECTED AREA TWICE A DAY (Patient not taking: Reported on 12/03/2022)   [employer] D3 25 MCG (1000 UT) capsule, TAKE 1 CAPSULE BY MOUTH EVERY DAY   dicyclomine (BENTYL) 10 MG capsule, Take 2 capsules (20 mg total) by mouth 3 (three) times daily as needed for spasms (abdominal pain).   dicyclomine (BENTYL) 10 MG capsule, Take 1 capsule (10 mg total) by mouth 3 (three) times daily as needed for spasms.   doxepin (SINEQUAN) 25 MG capsule, Take 1 capsule (25 mg total) by mouth at bedtime.   famotidine (PEPCID) 20 MG tablet, Take 1 tablet (20 mg total) by mouth daily. (Patient not taking: Reported on 12/03/2022)   hydrocortisone (ANUSOL-HC) 2.5 % rectal cream, Place 1 Application rectally 2 (two) times daily.   hydrocortisone (ANUSOL-HC) 25 MG suppository, Place 1 suppository (25 mg total) rectally 2 (two) times daily.   lamoTRIgine (LAMICTAL) 100 MG tablet, Take 1 tablet (100 mg total) by mouth daily.   linaclotide (LINZESS) 290 MCG CAPS capsule, Take 1 capsule (290 mcg total) by mouth daily before breakfast.   meclizine (ANTIVERT) 25 MG tablet, Take 1 tablet (25 mg total) by mouth 3 (three) times daily as needed for dizziness.   megestrol (MEGACE) 40 MG tablet, Take 2 tablets (80 mg total) by mouth daily.   omeprazole (PRILOSEC) 40 MG capsule, Take 1 capsule (40 mg total) by mouth 2 (two) times daily.   ondansetron (ZOFRAN-ODT) 8 MG disintegrating tablet, Take 1 tablet (8 mg total) by mouth every 8 (eight) hours as needed.   polyethylene glycol-electrolytes (NULYTELY) 420 g solution, Do 1/2 of the prep day 1 and the other 1/2 day two   tiZANidine (ZANAFLEX) 4 MG tablet, Take 1  tablet (4 mg total) by mouth every 8 (eight) hours as  needed for muscle spasms.  Medical History:  Past Medical History:  Diagnosis Date   Acid reflux    Anemia    Iron Def   Anorexia    CHF (congestive heart failure) (HCC)    Chronic kidney disease    Nephrotic syndrome   Colon polyp 2009   Depression with anxiety    Edema leg    Fibroid tumor 04/2022   Fibromyalgia    Hemorrhoids    Hidradenitis suppurativa    Hypertension    IBS (irritable bowel syndrome)    Low back pain    Migraines    Morbidly obese (HCC)    Neuromuscular disorder (HCC)    fibromyalgia   Neuropathy    Panic attacks    Polyp of vocal cord or larynx    Stroke (HCC)    Tonsil pain    Allergies:  Allergies  Allergen Reactions   Lisinopril Rash and Cough     Surgical History:  She  has a past surgical history that includes Hemorrhoid surgery; Upper gastrointestinal endoscopy; Axillary hidradenitis excision; Inguinal hidradenitis excision; Tonsillectomy (10/18/2010); Colonoscopy with propofol (N/A, 05/25/2015); and Colonoscopy with propofol (N/A, 12/31/2018). Family History:  Her family history includes Anxiety disorder in her mother and sister; Cancer in her father; Depression in her mother, sister, and sister; Diabetes in her mother; Heart disease in her father and mother; High blood pressure in her mother; Kidney failure in her mother; Learning disabilities in her sister.  REVIEW OF SYSTEMS  : All other systems reviewed and negative except where noted in the History of Present Illness.  PHYSICAL EXAM: There were no vitals taken for this visit. General Appearance: Well nourished, in no apparent distress. Head:   Normocephalic and atraumatic. Eyes:  sclerae anicteric,conjunctive pink  Respiratory: Respiratory effort normal, BS equal bilaterally without rales, rhonchi, wheezing. Cardio: RRR with no MRGs. Peripheral pulses intact.  Abdomen: Soft,  Obese ,active bowel sounds. mild tenderness in the RUQ  and in the lower abdomen. Without guarding and Without rebound. No masses. RECTAL/ANOSCOPY: No fissures observed. No external hemorrhoids observed. Decreased rectal tone. Presence of large hemorrhoids and a large amount of very hard stool. Negative hemoccult.  Musculoskeletal: Full ROM, Normal gait. Without edema. Skin:  Dry and intact without significant lesions or rashes Neuro: Alert and  oriented x4;  No focal deficits. Psych:  Cooperative. Normal mood and affect.    Doree Albee, PA-C 12:48 PM

## 2023-01-07 ENCOUNTER — Ambulatory Visit: Payer: 59 | Admitting: Physician Assistant

## 2023-01-07 ENCOUNTER — Other Ambulatory Visit: Payer: Self-pay

## 2023-01-07 DIAGNOSIS — K5904 Chronic idiopathic constipation: Secondary | ICD-10-CM

## 2023-01-07 DIAGNOSIS — R109 Unspecified abdominal pain: Secondary | ICD-10-CM

## 2023-01-07 DIAGNOSIS — R112 Nausea with vomiting, unspecified: Secondary | ICD-10-CM

## 2023-01-07 NOTE — Telephone Encounter (Signed)
Spoke with pt and she states she did not know she had an appt. Appt rescheduled to 02/05/23 at 9am, tried to schedule her sooner but pt states she has to have an am appt due to transportation. [name] and KUB ordered, pt give the phone number [phone] to schedule xray and [name].

## 2023-01-07 NOTE — Telephone Encounter (Signed)
On here patient had an appointment with me today at 830 she is no longer on my schedule. Will get my nurse to call patient and see how she is doing and reschedule appointment for follow-up. Advised to go to the ER if there is any severe abdominal pain, unable to hold down food/water, blood in stool or vomit, chest pain, shortness of breath, or any worsening symptoms.  Patient's was to get abdominal ultrasound, also go ahead and get KUB to evaluate stool burden.

## 2023-01-07 NOTE — Addendum Note (Signed)
Addended by: Quentin Mulling on: 01/07/2023 08:40 AM   Modules accepted: Orders

## 2023-01-10 ENCOUNTER — Ambulatory Visit (INDEPENDENT_AMBULATORY_CARE_PROVIDER_SITE_OTHER): Payer: 59 | Admitting: Licensed Clinical Social Worker

## 2023-01-10 DIAGNOSIS — F331 Major depressive disorder, recurrent, moderate: Secondary | ICD-10-CM

## 2023-01-10 DIAGNOSIS — F411 Generalized anxiety disorder: Secondary | ICD-10-CM | POA: Diagnosis not present

## 2023-01-10 DIAGNOSIS — F410 Panic disorder [episodic paroxysmal anxiety] without agoraphobia: Secondary | ICD-10-CM | POA: Diagnosis not present

## 2023-01-10 DIAGNOSIS — F4312 Post-traumatic stress disorder, chronic: Secondary | ICD-10-CM

## 2023-01-10 NOTE — Progress Notes (Signed)
Virtual Visit via Video Note  I connected with [name] on 01/10/23 at  8:00 AM EST by a video enabled telemedicine application and verified that I am speaking with the correct person using two identifiers.  Location: Patient: home Provider: home office   I discussed the limitations of evaluation and management by telemedicine and the availability of in person appointments. The patient expressed understanding and agreed to proceed.   I discussed the assessment and treatment plan with the patient. The patient was provided an opportunity to ask questions and all were answered. The patient agreed with the plan and demonstrated an understanding of the instructions.   The patient was advised to call back or seek an in-person evaluation if the symptoms worsen or if the condition fails to improve as anticipated.  I provided 40 minutes of non-face-to-face time during this encounter.  THERAPIST PROGRESS NOTE  Session Time: 8:00 AM to 8:40 AM  Participation Level: Active  Behavioral Response: CasualDrowsyappropriate  Type of Therapy: Individual Therapy  Treatment Goals addressed: patient continue to utilize therapy for  supportive and strength-based interventions, provide treatment interventions in the context of patient continuing to seek help and cope with medical issues, anxiety, triggers, coping with mental health symptoms, trauma, utilize therapy as a way for patient to focus on working through current stressors that also helps to distract from pain  ProgressTowards Goals: Progressing-helpful for patient to have sessions to process thoughts and feelings helps with coping  Interventions: Solution Focused, Strength-based, Supportive, and Other: coping  Summary: Debbrah Gradillas is a 52 y.o. female who presents with had her birthday. Didn't do anything but thanked the lord for waking her up.  Therapist shared grateful for the small things to agrees with patient.  Could have plans but too  cold, also aching really bad. Has been up all night couldn't sleep. Has been like that last couple of nights. The pain contributes to not being able to sleep. Birth sign Sagittarius and it said they "don't gave damm". We both decided that it fits her.  Therapist shared there is actually book out and the philosophy is we don't need to care so much helps [name] cope with life therapist has found that to be true in retrospect things not as big a deal as she thought. Patient drowsy because of the medicine. Thanksgiving was ok. Had cornish hens. Talking about her daughter, Estill Bakes. Made up daughter's name because mom's name is Cora.  Therapist noted very pretty name..  Pain is not managed well still. PCP is managing. Doctor told you have to careful with meds because of the breathing issue.  Therapist thought that might be the case. Patient drowsy and fell asleep she woke up when both laughed we figured out her schedule for therapy and finished session. Provider use empathetic listening direct questioning discussed current and underlying problems which in patient's case continue to be medical issues.  Assess helpful for her to have a connection with therapist as her medical issues contribute to lack of mobility helps to have the support connection.  Therapist assisted through strategic reflection process thoughts and feelings in session.  Suicidal/Homicidal: No  Plan: Return again in 5 weeks.(Patient put on waiting list since appointment is further out).  2.  Processed thoughts and feelings in session to help with coping  Diagnosis: Major depressive disorder, recurrent, moderate, generalized anxiety disorder, chronic PTSD, panic attacks  Collaboration of Care: Other none needed  Patient/Guardian was advised Release of Information must be obtained prior to  any record release in order to collaborate their care with an outside provider. Patient/Guardian was advised if they have not already done so to contact the  registration department to sign all necessary forms in order for [name] to release information regarding their care.   Consent: Patient/Guardian gives verbal consent for treatment and assignment of benefits for services provided during this visit. Patient/Guardian expressed understanding and agreed to proceed.   Coolidge Breeze, LCSW 01/10/2023

## 2023-01-12 ENCOUNTER — Emergency Department (HOSPITAL_COMMUNITY): Payer: 59

## 2023-01-12 ENCOUNTER — Observation Stay (HOSPITAL_COMMUNITY)
Admission: EM | Admit: 2023-01-12 | Discharge: 2023-01-14 | Disposition: A | Discharge status: Home / Self Care | Payer: 59 | Attending: Infectious Diseases | Admitting: Infectious Diseases

## 2023-01-12 DIAGNOSIS — I959 Hypotension, unspecified: Secondary | ICD-10-CM | POA: Diagnosis not present

## 2023-01-12 DIAGNOSIS — I5020 Unspecified systolic (congestive) heart failure: Secondary | ICD-10-CM | POA: Insufficient documentation

## 2023-01-12 DIAGNOSIS — R42 Dizziness and giddiness: Secondary | ICD-10-CM | POA: Diagnosis not present

## 2023-01-12 DIAGNOSIS — D500 Iron deficiency anemia secondary to blood loss (chronic): Secondary | ICD-10-CM | POA: Insufficient documentation

## 2023-01-12 DIAGNOSIS — S92902A Unspecified fracture of left foot, initial encounter for closed fracture: Secondary | ICD-10-CM | POA: Diagnosis not present

## 2023-01-12 DIAGNOSIS — Z23 Encounter for immunization: Secondary | ICD-10-CM | POA: Diagnosis not present

## 2023-01-12 DIAGNOSIS — I5023 Acute on chronic systolic (congestive) heart failure: Secondary | ICD-10-CM

## 2023-01-12 DIAGNOSIS — R404 Transient alteration of awareness: Secondary | ICD-10-CM | POA: Diagnosis not present

## 2023-01-12 DIAGNOSIS — Z79899 Other long term (current) drug therapy: Secondary | ICD-10-CM | POA: Diagnosis not present

## 2023-01-12 DIAGNOSIS — M84475A Pathological fracture, left foot, initial encounter for fracture: Secondary | ICD-10-CM

## 2023-01-12 DIAGNOSIS — L732 Hidradenitis suppurativa: Secondary | ICD-10-CM | POA: Diagnosis not present

## 2023-01-12 DIAGNOSIS — I110 Hypertensive heart disease with heart failure: Secondary | ICD-10-CM | POA: Diagnosis not present

## 2023-01-12 DIAGNOSIS — R6889 Other general symptoms and signs: Secondary | ICD-10-CM | POA: Diagnosis not present

## 2023-01-12 DIAGNOSIS — S0990XA Unspecified injury of head, initial encounter: Secondary | ICD-10-CM | POA: Diagnosis not present

## 2023-01-12 DIAGNOSIS — M7732 Calcaneal spur, left foot: Secondary | ICD-10-CM | POA: Diagnosis not present

## 2023-01-12 DIAGNOSIS — W19XXXA Unspecified fall, initial encounter: Principal | ICD-10-CM | POA: Insufficient documentation

## 2023-01-12 DIAGNOSIS — S93302A Unspecified subluxation of left foot, initial encounter: Secondary | ICD-10-CM | POA: Insufficient documentation

## 2023-01-12 DIAGNOSIS — J449 Chronic obstructive pulmonary disease, unspecified: Secondary | ICD-10-CM | POA: Insufficient documentation

## 2023-01-12 DIAGNOSIS — E785 Hyperlipidemia, unspecified: Secondary | ICD-10-CM | POA: Insufficient documentation

## 2023-01-12 DIAGNOSIS — J9811 Atelectasis: Secondary | ICD-10-CM | POA: Diagnosis not present

## 2023-01-12 DIAGNOSIS — M2142 Flat foot [pes planus] (acquired), left foot: Secondary | ICD-10-CM | POA: Diagnosis not present

## 2023-01-12 DIAGNOSIS — Z043 Encounter for examination and observation following other accident: Secondary | ICD-10-CM | POA: Diagnosis not present

## 2023-01-12 DIAGNOSIS — S92302A Fracture of unspecified metatarsal bone(s), left foot, initial encounter for closed fracture: Secondary | ICD-10-CM

## 2023-01-12 DIAGNOSIS — I10 Essential (primary) hypertension: Secondary | ICD-10-CM | POA: Diagnosis not present

## 2023-01-12 DIAGNOSIS — R55 Syncope and collapse: Secondary | ICD-10-CM | POA: Diagnosis not present

## 2023-01-12 DIAGNOSIS — R918 Other nonspecific abnormal finding of lung field: Secondary | ICD-10-CM | POA: Diagnosis not present

## 2023-01-12 DIAGNOSIS — M2012 Hallux valgus (acquired), left foot: Secondary | ICD-10-CM | POA: Diagnosis not present

## 2023-01-12 DIAGNOSIS — I272 Pulmonary hypertension, unspecified: Secondary | ICD-10-CM

## 2023-01-12 LAB — CBC WITH DIFFERENTIAL/PLATELET
Abs Immature Granulocytes: 0.03 10*3/uL (ref 0.00–0.07)
Basophils Absolute: 0 10*3/uL (ref 0.0–0.1)
Basophils Relative: 1 %
Eosinophils Absolute: 0.2 10*3/uL (ref 0.0–0.5)
Eosinophils Relative: 3 %
HCT: 33.3 % — ABNORMAL LOW (ref 36.0–46.0)
Hemoglobin: 10.2 g/dL — ABNORMAL LOW (ref 12.0–15.0)
Immature Granulocytes: 0 %
Lymphocytes Relative: 41 %
Lymphs Abs: 2.8 10*3/uL (ref 0.7–4.0)
MCH: 26.1 pg (ref 26.0–34.0)
MCHC: 30.6 g/dL (ref 30.0–36.0)
MCV: 85.2 fL (ref 80.0–100.0)
Monocytes Absolute: 0.7 10*3/uL (ref 0.1–1.0)
Monocytes Relative: 11 %
Neutro Abs: 3 10*3/uL (ref 1.7–7.7)
Neutrophils Relative %: 44 %
Platelets: 368 10*3/uL (ref 150–400)
RBC: 3.91 MIL/uL (ref 3.87–5.11)
RDW: 16.1 % — ABNORMAL HIGH (ref 11.5–15.5)
WBC: 6.8 10*3/uL (ref 4.0–10.5)
nRBC: 0 % (ref 0.0–0.2)

## 2023-01-12 LAB — COMPREHENSIVE METABOLIC PANEL
ALT: 8 U/L (ref 0–44)
AST: 12 U/L — ABNORMAL LOW (ref 15–41)
Albumin: 3.2 g/dL — ABNORMAL LOW (ref 3.5–5.0)
Alkaline Phosphatase: 60 U/L (ref 38–126)
Anion gap: 9 (ref 5–15)
BUN: 9 mg/dL (ref 6–20)
CO2: 25 mmol/L (ref 22–32)
Calcium: 8.9 mg/dL (ref 8.9–10.3)
Chloride: 105 mmol/L (ref 98–111)
Creatinine, Ser: 1.3 mg/dL — ABNORMAL HIGH (ref 0.44–1.00)
GFR, Estimated: 49 mL/min — ABNORMAL LOW (ref >60)
Glucose, Bld: 96 mg/dL (ref 70–99)
Potassium: 3.9 mmol/L (ref 3.5–5.1)
Sodium: 139 mmol/L (ref 135–145)
Total Bilirubin: 0.5 mg/dL (ref <1.2)
Total Protein: 6.1 g/dL — ABNORMAL LOW (ref 6.5–8.1)

## 2023-01-12 LAB — BRAIN NATRIURETIC PEPTIDE: B Natriuretic Peptide: 36.3 pg/mL (ref 0.0–100.0)

## 2023-01-12 LAB — D-DIMER, QUANTITATIVE: D-Dimer, Quant: 1.7 ug{FEU}/mL — ABNORMAL HIGH (ref 0.00–0.50)

## 2023-01-12 LAB — TROPONIN I (HIGH SENSITIVITY): Troponin I (High Sensitivity): 5 ng/L (ref <18)

## 2023-01-12 MED ORDER — SODIUM CHLORIDE 0.9 % IV BOLUS
1000.0000 mL | Freq: Once | INTRAVENOUS | Status: AC
  Administered 2023-01-12: 1000 mL via INTRAVENOUS

## 2023-01-12 MED ORDER — DOXEPIN HCL 25 MG PO CAPS: 25.0000 mg | ORAL_CAPSULE | Freq: Every day | ORAL | Status: DC

## 2023-01-12 MED ORDER — DICLOFENAC SODIUM 1 % EX GEL
4.0000 g | Freq: Four times a day (QID) | CUTANEOUS | Status: DC
  Administered 2023-01-13 (×2): 4 g via TOPICAL
  Filled 2023-01-12: qty 100

## 2023-01-12 MED ORDER — BREXPIPRAZOLE 2 MG PO TABS
3.0000 mg | ORAL_TABLET | Freq: Every day | ORAL | Status: DC
  Administered 2023-01-13 – 2023-01-14 (×2): 3 mg via ORAL
  Filled 2023-01-12 (×2): qty 1.5

## 2023-01-12 MED ORDER — ATORVASTATIN CALCIUM 40 MG PO TABS
40.0000 mg | ORAL_TABLET | Freq: Every day | ORAL | Status: DC
  Administered 2023-01-12 – 2023-01-13 (×2): 40 mg via ORAL
  Filled 2023-01-12 (×2): qty 1

## 2023-01-12 MED ORDER — FENTANYL CITRATE PF 50 MCG/ML IJ SOSY
50.0000 ug | PREFILLED_SYRINGE | Freq: Once | INTRAMUSCULAR | Status: AC
Start: 2023-01-12 — End: 2023-01-12
  Administered 2023-01-12: 50 ug via INTRAVENOUS
  Filled 2023-01-12: qty 1

## 2023-01-12 MED ORDER — OXYCODONE HCL 5 MG PO TABS
5.0000 mg | ORAL_TABLET | Freq: Four times a day (QID) | ORAL | Status: DC | PRN
  Administered 2023-01-12: 5 mg via ORAL
  Filled 2023-01-12: qty 1

## 2023-01-12 MED ORDER — PANTOPRAZOLE SODIUM 40 MG PO TBEC
40.0000 mg | DELAYED_RELEASE_TABLET | Freq: Every day | ORAL | Status: DC
  Administered 2023-01-12 – 2023-01-14 (×3): 40 mg via ORAL
  Filled 2023-01-12 (×3): qty 1

## 2023-01-12 MED ORDER — ENOXAPARIN SODIUM 80 MG/0.8ML IJ SOSY
70.0000 mg | PREFILLED_SYRINGE | INTRAMUSCULAR | Status: DC
  Administered 2023-01-12 – 2023-01-13 (×2): 70 mg via SUBCUTANEOUS
  Filled 2023-01-12 (×3): qty 0.7

## 2023-01-12 MED ORDER — ACETAMINOPHEN 500 MG PO TABS
1000.0000 mg | ORAL_TABLET | Freq: Three times a day (TID) | ORAL | Status: DC
  Administered 2023-01-12 – 2023-01-14 (×5): 1000 mg via ORAL
  Filled 2023-01-12 (×5): qty 2

## 2023-01-12 MED ORDER — OXYCODONE HCL 5 MG PO TABS
5.0000 mg | ORAL_TABLET | ORAL | Status: DC | PRN
  Administered 2023-01-13 (×2): 5 mg via ORAL
  Filled 2023-01-12 (×2): qty 1

## 2023-01-12 MED ORDER — IOHEXOL 350 MG/ML SOLN
75.0000 mL | Freq: Once | INTRAVENOUS | Status: AC | PRN
  Administered 2023-01-12: 75 mL via INTRAVENOUS

## 2023-01-12 MED ORDER — PANTOPRAZOLE SODIUM 40 MG PO TBEC: 40.0000 mg | DELAYED_RELEASE_TABLET | Freq: Every day | ORAL | Status: DC

## 2023-01-12 MED ORDER — LAMOTRIGINE 100 MG PO TABS
100.0000 mg | ORAL_TABLET | Freq: Every day | ORAL | Status: DC
  Administered 2023-01-13 – 2023-01-14 (×2): 100 mg via ORAL
  Filled 2023-01-12 (×2): qty 1

## 2023-01-12 MED ORDER — SENNOSIDES-DOCUSATE SODIUM 8.6-50 MG PO TABS: 1.0000 | ORAL_TABLET | Freq: Every evening | ORAL | Status: DC | PRN

## 2023-01-12 MED ORDER — ATORVASTATIN CALCIUM 40 MG PO TABS: 40.0000 mg | ORAL_TABLET | Freq: Every day | ORAL | Status: DC

## 2023-01-12 MED ORDER — DOXEPIN HCL 25 MG PO CAPS
25.0000 mg | ORAL_CAPSULE | Freq: Every day | ORAL | Status: DC
  Administered 2023-01-12 – 2023-01-13 (×2): 25 mg via ORAL
  Filled 2023-01-12 (×3): qty 1

## 2023-01-12 NOTE — H&P (Cosign Needed)
Date: 01/12/2023               Patient Name:  Lindsay [name] MRN: [id number]  DOB: November 06, 1970 Age / Sex: 52 y.o., female   PCP: [name], MD         Medical Service: Internal Medicine Teaching Service         Attending Physician: Dr. Ginnie Smart, MD      First Contact: Dr. Laretta Bolster, MD Pager [phone]    Second Contact: Dr. Rana Snare, DO Pager [phone]         After Hours (After 5p/  First Contact Pager: [phone]  weekends / holidays): Second Contact Pager: [phone]   SUBJECTIVE   Chief Complaint: Near syncope  History of Present Illness:  Ms. Jankowski is a 52 year old female with a history of HFrEF last EF 45%, chronic respiratory failure on 2 L of oxygen at baseline, COPD who presented to the ED with a near syncopal episode of the fall.  She was seen in the ED on 08/2022 for a similar fall.  This was thought to be secondary to tramadol use, patient has been weaned off.  She had a fall today around 3pm, while he was moving from bathroom to the bedroom. She fell on the left side causing her to fracture the inner left knee and left toes. She hit the back of her head, but didn't lose consciousness. She is not blood thinners.  She denies prodromal sxs, or sensation of the room spinning.  She had one episode of vomiting yesterday, no nausea or sick contacts. Denies loss of consciousness, but was on the floor for about 10 mins until she was found by her mother. Not endorsing bowel or bladder incontinence.   The patient states that she has not been taking her home diuretic for the past month because she could not find it. She states that she has had some dyspnea and difficulty lying flat. Denies cough or worsening sputum production.    ED Course: BMP normal CBC-hemoglobin 10.2. CMP serum creatinine 1.30. D-dimer 1.70. Chest x-ray-no acute abnormality. Hazy attenuation of the bilateral lung bases. Normal head CT CTA negative for PE.  Enlarged main pulmonary  suggestive of a prior history of pulmonary hypertension.  Past Medical History Acid reflux Anemia CHF Nephrotic syndrome Panic attacks Depression/with anxiety Hidradenitis suppurativa Hypertension Migration .   Meds:  -albuterol PRN -atorvastatin 40 mg -brexpiprazole 3 mg daily -doxepin 25 mg at bedtime  -Entresto 49-51 mg BID -lamotrigine 100 mg  -Linzess 290 mcg daily -meclizine 25 mg TID PRN -megace 80 mg daily -omeprazole 40 mg BID -metoprolol succinate 50 mg daily -torsemide 40 mg BID -Ozempic 2 mg weekly -clindamycin gel BID PRN   Past Surgical History  Past Surgical History:  Procedure Laterality Date   AXILLARY HIDRADENITIS EXCISION     COLONOSCOPY WITH PROPOFOL N/A 05/25/2015   Procedure: COLONOSCOPY WITH PROPOFOL;  Surgeon: Rachael Fee, MD;  Location: [facility];  Service: Endoscopy;  Laterality: N/A;   COLONOSCOPY WITH PROPOFOL N/A 12/31/2018   Procedure: COLONOSCOPY WITH PROPOFOL;  Surgeon: Rachael Fee, MD;  Location: [facility];  Service: Endoscopy;  Laterality: N/A;   HEMORRHOID SURGERY     with Hidradenitis surgery    INGUINAL HIDRADENITIS EXCISION     TONSILLECTOMY  10/18/2010   by Dr. Annalee Genta   UPPER GASTROINTESTINAL ENDOSCOPY      Social:  Lives with her mother in [place]. Occupation: Not working, and daughter. Support: Her mother. Level of  Function: Dependent of ADLs sometimes, and IADLs. PCP: [name], MD Substances: Former smoker, 1/2 ppd x 20 years, denies alcohol or occasional drug use.  Family History:  Hypertension-mother  kidney disease-mother. Stroke-mother.  Allergies: Allergies as of 01/12/2023 - Review Complete 01/12/2023  Allergen Reaction Noted   Lisinopril Rash and Cough 11/06/2015    Review of Systems: A complete ROS was negative except as per HPI.   OBJECTIVE:   Physical Exam: Blood pressure (!) 124/101, pulse 89, temperature 98.1 F (36.7 C), temperature source Oral, resp. rate (!)  22, height 5\' 10"  (1.778 m), weight (!) 141 kg, SpO2 98%.    Constitutional: Obese, not in acute distress. Cardio:Regular rate and rhythm. No murmurs, rubs, or gallops. 2+ bilateral dorsalis pedis pulses. Pulm:Clear to auscultation bilaterally. Normal work of breathing on room air. Abdomen: Soft, non-tender, non-distended, positive bowel sounds. EAV:[id number] for extremity edema.  Dorsum of left foot tender to palpation.  Mild tenderness to medial left thigh. Skin:Warm and dry.   Labs: CBC    Component Value Date/Time   WBC 6.8 01/12/2023 1240   RBC 3.91 01/12/2023 1240   HGB 10.2 (L) 01/12/2023 1240   HGB 11.4 08/07/2022 1024   HCT 33.3 (L) 01/12/2023 1240   HCT 34.5 08/07/2022 1024   PLT 368 01/12/2023 1240   PLT 421 08/07/2022 1024   MCV 85.2 01/12/2023 1240   MCV 83 08/07/2022 1024   MCH 26.1 01/12/2023 1240   MCHC 30.6 01/12/2023 1240   RDW 16.1 (H) 01/12/2023 1240   RDW 14.3 08/07/2022 1024   LYMPHSABS 2.8 01/12/2023 1240   LYMPHSABS 2.0 08/07/2022 1024   MONOABS 0.7 01/12/2023 1240   EOSABS 0.2 01/12/2023 1240   EOSABS 0.2 08/07/2022 1024   BASOSABS 0.0 01/12/2023 1240   BASOSABS 0.0 08/07/2022 1024     CMP     Component Value Date/Time   NA 139 01/12/2023 1240   NA 142 08/07/2022 1024   K 3.9 01/12/2023 1240   CL 105 01/12/2023 1240   CO2 25 01/12/2023 1240   GLUCOSE 96 01/12/2023 1240   BUN 9 01/12/2023 1240   BUN 12 08/07/2022 1024   CREATININE 1.30 (H) 01/12/2023 1240   CREATININE 0.88 09/15/2013 1032   CALCIUM 8.9 01/12/2023 1240   PROT 6.1 (L) 01/12/2023 1240   PROT 6.7 08/07/2022 1024   ALBUMIN 3.2 (L) 01/12/2023 1240   ALBUMIN 4.3 08/07/2022 1024   AST 12 (L) 01/12/2023 1240   ALT 8 01/12/2023 1240   ALKPHOS 60 01/12/2023 1240   BILITOT 0.5 01/12/2023 1240   BILITOT <0.2 08/07/2022 1024   GFRNONAA 49 (L) 01/12/2023 1240   GFRAA 67 02/02/2020 0933    Imaging:  CT Angio Chest PE W and/or Wo Contrast Result Date: 01/12/2023 CLINICAL  DATA:  Pulmonary embolism (PE) suspected, high prob. Acute onset of dizziness precipitating a fall while walking. EXAM: CT ANGIOGRAPHY CHEST WITH CONTRAST TECHNIQUE: Multidetector CT imaging of the chest was performed using the standard protocol during bolus administration of intravenous contrast. Multiplanar CT image reconstructions and MIPs were obtained to evaluate the vascular anatomy. RADIATION DOSE REDUCTION: This exam was performed according to the departmental dose-optimization program which includes automated exposure control, adjustment of the mA and/or kV according to patient size and/or use of iterative reconstruction technique. CONTRAST:  75mL OMNIPAQUE IOHEXOL 350 MG/ML SOLN COMPARISON:  CTA chest/abdomen/pelvis 05/16/2022. FINDINGS: Cardiovascular: Suboptimal timing of the contrast bolus with satisfactory opacification of the pulmonary arteries to the lobar level. No convincing evidence  of pulmonary embolism. Enlarged main pulmonary artery, suggestive of pulmonary hypertension. Mediastinum/Nodes: No enlarged mediastinal, hilar, or axillary lymph nodes. Thyroid gland, trachea, and esophagus demonstrate no significant findings. Lungs/Pleura: Unchanged 5 mm subpleural nodular opacity in the lateral aspect of the superior left lower lobe segment (axial image 59 series 6). Patchy peripheral opacities in the basilar left lower lobe may be of infectious or inflammatory etiology. Subsegmental atelectasis in the lingula. No pleural effusion or pneumothorax. Upper Abdomen: No acute findings. Musculoskeletal: No chest wall abnormality. No acute or significant osseous findings. Review of the MIP images confirms the above findings. IMPRESSION: 1. Suboptimal timing of the contrast bolus with satisfactory opacification of the pulmonary arteries to the lobar level. No convincing evidence of pulmonary embolism. 2. Patchy peripheral opacities in the basilar left lower lobe may be of infectious or inflammatory etiology.  3. Unchanged 5 mm subpleural nodular opacity in the lateral aspect of the superior left lower lobe segment. 4. Enlarged main pulmonary artery, suggestive of pulmonary hypertension. Electronically Signed   By: Orvan Falconer M.D.   On: 01/12/2023 15:10   CT HEAD WO CONTRAST ( ) Result Date: 01/12/2023 CLINICAL DATA:  Head trauma, moderate-severe. Fall with head strike. EXAM: CT HEAD WITHOUT CONTRAST TECHNIQUE: Contiguous axial images were obtained from the base of the skull through the vertex without intravenous contrast. RADIATION DOSE REDUCTION: This exam was performed according to the departmental dose-optimization program which includes automated exposure control, adjustment of the mA and/or kV according to patient size and/or use of iterative reconstruction technique. COMPARISON:  CTA head/neck and MRI brain 05/16/2022. FINDINGS: Brain: No acute intracranial hemorrhage. Gray-white differentiation is preserved. No hydrocephalus or extra-axial collection. No mass effect or midline shift. Vascular: No hyperdense vessel or unexpected calcification. Skull: No calvarial fracture or suspicious bone lesion. Skull base is unremarkable. Sinuses/Orbits: No acute finding. Other: None. IMPRESSION: No acute intracranial abnormality. Electronically Signed   By: Orvan Falconer M.D.   On: 01/12/2023 15:00   DG Foot Complete Left Result Date: 01/12/2023 CLINICAL DATA:  Fall.  Left foot pain. EXAM: LEFT FOOT - COMPLETE 3+ VIEW COMPARISON:  Left foot radiographs 08/26/2014 FINDINGS: Mild hallux valgus is similar to prior. Minimal medial greater than lateral great toe metatarsophalangeal degenerative spurring. There are new linear lucencies indicating acute fractures of the second through fifth metatarsal necks and portions of the metatarsal heads. There is up to 1-2 mm cortical step-off at the lateral aspect of each metatarsal neck. Mild distal medial talar neck degenerative spurring. Mild dorsal talar neck degenerative  osteophytosis with mild adjacent posterior dorsal navicular degenerative osteophytosis. Small plantar and posterior calcaneal heel spurs. Mild pes planus. IMPRESSION: 1. Acute fractures of the second through fifth metatarsal necks and portions of the metatarsal heads. 2. Mild hallux valgus. 3. Mild pes planus. Electronically Signed   By: Neita Garnet M.D.   On: 01/12/2023 14:35   DG Chest Port 1 View Result Date: 01/12/2023 CLINICAL DATA:  Near syncope EXAM: PORTABLE CHEST 1 VIEW COMPARISON:  05/16/2022 FINDINGS: The heart size and mediastinal contours are within normal limits. Hazy attenuation over the bilateral lung bases is favored to be secondary to summation of overlying soft tissues. Mild bibasilar atelectasis or edema are not excluded. No appreciable pleural fluid collection. No pneumothorax. The visualized skeletal structures are unremarkable. IMPRESSION: Hazy attenuation over the bilateral lung bases is favored to be secondary to summation of overlying soft tissues. Mild bibasilar atelectasis or edema are not excluded. Electronically Signed   By: Janyth Pupa  Plundo D.O.   On: 01/12/2023 13:38     EKG: personally reviewed my interpretation is  sinus rhythm, ventricular rate 84, borderline T wave abnormalities, no acute ischemic changes.  ASSESSMENT & PLAN:   Assessment & Plan by Problem: Principal Problem:   Near syncope   Ms. Belluomini is a 51 year old female with a history of HFrEF last EF 45%, chronic respiratory failure on 2 L of oxygen at baseline, COPD who presented to the ED with a near syncopal episode of the fall, and is admitted for further workup of the syncope.  Near syncope  Patient presenting with syncopal episode at home, no loss of consciousness.  On presentation, BP 87/59, otherwise no electrolyte changes on labs.  Imaging with head CT and chest x-ray unremarkable. Patient recurrent history of falls, of unclear etiology.  Differential diagnosis for the syncope includes  hypovolemic vs. cardiac vs. CNS (seizures or stroke).  Low suspicion for cardiac etiology, given the normal EKG, normal heart exam.  Syncope likely multifactorial, in the setting of hypovolemia and deconditioning.  - Cardiac monitoring - Check orthostatics  -Hold meclizine - PT/OT   # Acute left toe fracture.   Trauma to the left toe after fall. X ray of the foot shows acute fracture of the 2nd- 5th metatarsal necks and portions of the metatarsal heads. On my exam she has tenderness over the dorsum of left foot, looks slightly swollen, with mild tenderness to the interior left thighs.  Orthopedics consulted, is yet to see patient.  - Orthopedics following appreciate rec's. - Multimodal pain control.  Heart failure with reduced ejection fraction. Last echo 10/21 showed LVEF of 45 to 50%, with mildly decreased function and left ventricular hypertrophy.  No signs of volume overload on my exam.  For heart failure, she is on metoprolol, torsemide, and Entresto.    -Hold torsemide. -Continue Entresto 41/51. -Continue metoprolol succinate 50 mg.  Acute on chronic anemia Hb 10.2, history of anemia secondary to menorrhagia.  Both iron and ferritin with normal in 11/2022.    -Monitor. -CBC.  Chronic stable medical conditions.  # Depression/anxiety.  - Doxepin 25 mg. - brexpiprazole 3 mg daily  # Type 2 Diabetes.  Stable.  A1c 5.6 about 6 months ago.    Obesity BMI 44.6, she is on weekly Ozempic 2 mg.  # GERD -Omeprazole 40 mg twice daily.     # IBS -Linzess to 290 mcg.  # Hyperlipidemia - Atorvastatin 40 mg.  # Hidradenitis suppurativa -Clindamycin gel twice daily as needed.   Diet: Normal VTE: Enoxaparin IVF: None. Code: Full  Dispo: Admit patient to Observation with expected length of stay less than 2 midnights.  Signed:  Laretta Bolster, M.D.  Internal Medicine Resident, PGY-1 [facility]  Pager: [phone] 5:46 PM, 01/12/2023    **Please contact the on call pager after 5 pm and on weekends at [phone].**

## 2023-01-12 NOTE — Progress Notes (Signed)
Orthopedic Tech Progress Note Patient Details:  Lindsay [name] 08-Aug-1970 [id number]  Ortho Devices Type of Ortho Device: CAM walker Ortho Device/Splint Location: LLE Ortho Device/Splint Interventions: Application, Adjustment, Ordered   Post Interventions Patient Tolerated: Poor Instructions Provided: Care of device, Adjustment of device  Lindsay [name] 01/12/2023, 7:21 PM

## 2023-01-12 NOTE — Hospital Course (Addendum)
-  CHF -CRF on 2-3L [place] at home -near syncope -hypotension, 500 cc bolus -no spinning or dizzy -no LOC, did hit head, negative CT H and CT chest -afebrile, normal WBC  -fell today in bedroom around 3pm  -denies dizziness  -fell back and hit head -no LOC -nausea and vomiting yesterday -felt generalized weak   -denies chest pain, abdominal pain, fever -no sick contacts     -live with *** -1/2 ppd x 20 years, former?*** -denies alcohol or recreational drug use -has home aide to assist with ADLs

## 2023-01-12 NOTE — ED Triage Notes (Signed)
Pt to the ed from home with a CC of fall while walking after lunch. Pt relay she got dizzy while walking and fell. Pt hit back of head, denies LOC, pt co of left leg pain. Pt denies blood thinners, sob, chest pain. Pt relays she wears 2 lpm 02 at the house.

## 2023-01-12 NOTE — ED Notes (Signed)
ED TO INPATIENT HANDOFF REPORT  ED Nurse Name and Phone #: [phone]  S Name/Age/Gender Lindsay [name] 52 y.o. female Room/Bed: 006C/006C  Code Status   Code Status: Full Code  Home/SNF/Other Home Patient oriented to: self, place, time, and situation Is this baseline? Yes   Triage Complete: Triage complete  Chief Complaint Near syncope [R55]  Triage Note Pt to the ed from home with a CC of fall while walking after lunch. Pt relay she got dizzy while walking and fell. Pt hit back of head, denies LOC, pt co of left leg pain. Pt denies blood thinners, sob, chest pain. Pt relays she wears 2 lpm 02 at the house.    Allergies Allergies  Allergen Reactions   Lisinopril Rash and Cough    Level of Care/Admitting Diagnosis ED Disposition     ED Disposition  Admit   Condition  --   Comment  Hospital Area: [facility] [[id number]]  Level of Care: Telemetry Medical [104]  May place patient in observation at [facility] or [facility] if equivalent level of care is available:: No  Covid Evaluation: Asymptomatic - no recent exposure (last 10 days) testing not required  Diagnosis: Near syncope [119147]  Admitting Physician: Ginnie Smart [[id number]]  Attending Physician: Ninetta Lights, JEFFREY C [[id number]]          B Medical/Surgery History Past Medical History:  Diagnosis Date   Acid reflux    Anemia    Iron Def   Anorexia    CHF (congestive heart failure) (HCC)    Chronic kidney disease    Nephrotic syndrome   Colon polyp 2009   Depression with anxiety    Edema leg    Fibroid tumor 04/2022   Fibromyalgia    Hemorrhoids    Hidradenitis suppurativa    Hypertension    IBS (irritable bowel syndrome)    Low back pain    Migraines    Morbidly obese (HCC)    Neuromuscular disorder (HCC)    fibromyalgia   Neuropathy    Panic attacks    Polyp of vocal cord or larynx    Stroke (HCC)    Tonsil pain    Past Surgical History:  Procedure Laterality Date    AXILLARY HIDRADENITIS EXCISION     COLONOSCOPY WITH PROPOFOL N/A 05/25/2015   Procedure: COLONOSCOPY WITH PROPOFOL;  Surgeon: Rachael Fee, MD;  Location: [facility];  Service: Endoscopy;  Laterality: N/A;   COLONOSCOPY WITH PROPOFOL N/A 12/31/2018   Procedure: COLONOSCOPY WITH PROPOFOL;  Surgeon: Rachael Fee, MD;  Location: [facility];  Service: Endoscopy;  Laterality: N/A;   HEMORRHOID SURGERY     with Hidradenitis surgery    INGUINAL HIDRADENITIS EXCISION     TONSILLECTOMY  10/18/2010   by Dr. Annalee Genta   UPPER GASTROINTESTINAL ENDOSCOPY       A IV Location/Drains/Wounds Patient Lines/Drains/Airways Status     Active Line/Drains/Airways     Name Placement date Placement time Site Days   Peripheral IV 01/12/23 20 G Right Antecubital 01/12/23  1249  Antecubital  less than 1            Intake/Output Last 24 hours No intake or output data in the 24 hours ending 01/12/23 1744  Labs/Imaging Results for orders placed or performed during the hospital encounter of 01/12/23 (from the past 48 hours)  CBC WITH DIFFERENTIAL     Status: Abnormal   Collection Time: 01/12/23 12:40 PM  Result Value Ref Range  WBC 6.8 4.0 - 10.5 K/uL   RBC 3.91 3.87 - 5.11 MIL/uL   Hemoglobin 10.2 (L) 12.0 - 15.0 g/dL   HCT 16.1 (L) 09.6 - 04.5 %   MCV 85.2 80.0 - 100.0 fL   MCH 26.1 26.0 - 34.0 pg   MCHC 30.6 30.0 - 36.0 g/dL   RDW 40.9 (H) 81.1 - 91.4 %   Platelets 368 150 - 400 K/uL   nRBC 0.0 0.0 - 0.2 %   Neutrophils Relative % 44 %   Neutro Abs 3.0 1.7 - 7.7 K/uL   Lymphocytes Relative 41 %   Lymphs Abs 2.8 0.7 - 4.0 K/uL   Monocytes Relative 11 %   Monocytes Absolute 0.7 0.1 - 1.0 K/uL   Eosinophils Relative 3 %   Eosinophils Absolute 0.2 0.0 - 0.5 K/uL   Basophils Relative 1 %   Basophils Absolute 0.0 0.0 - 0.1 K/uL   Immature Granulocytes 0 %   Abs Immature Granulocytes 0.03 0.00 - 0.07 K/uL    Comment: Performed at [facility], [address], [place],  [place] [address]  Comprehensive metabolic panel     Status: Abnormal   Collection Time: 01/12/23 12:40 PM  Result Value Ref Range   Sodium 139 135 - 145 mmol/L   Potassium 3.9 3.5 - 5.1 mmol/L   Chloride 105 98 - 111 mmol/L   CO2 25 22 - 32 mmol/L   Glucose, Bld 96 70 - 99 mg/dL    Comment: Glucose reference range applies only to samples taken after fasting for at least 8 hours.   BUN 9 6 - 20 mg/dL   Creatinine, Ser 6.21 (H) 0.44 - 1.00 mg/dL   Calcium 8.9 8.9 - 30.8 mg/dL   Total Protein 6.1 (L) 6.5 - 8.1 g/dL   Albumin 3.2 (L) 3.5 - 5.0 g/dL   AST 12 (L) 15 - 41 U/L   ALT 8 0 - 44 U/L   Alkaline Phosphatase 60 38 - 126 U/L   Total Bilirubin 0.5 <1.2 mg/dL   GFR, Estimated 49 (L) >60 mL/min    Comment: (NOTE) Calculated using the CKD-EPI Creatinine Equation (2021)    Anion gap 9 5 - 15    Comment: Performed at [facility], [address] [place], [place] [address]  Troponin I (High Sensitivity)     Status: None   Collection Time: 01/12/23 12:40 PM  Result Value Ref Range   Troponin I (High Sensitivity) 5 <18 ng/L    Comment: (NOTE) Elevated high sensitivity troponin I (hsTnI) values and significant  changes across serial measurements may suggest ACS but many other  chronic and acute conditions are known to elevate hsTnI results.  Refer to the "Links" section for chest pain algorithms and additional  guidance. Performed at [facility], [address], [place], [place] [address]   Brain natriuretic peptide     Status: None   Collection Time: 01/12/23 12:40 PM  Result Value Ref Range   B Natriuretic Peptide 36.3 0.0 - 100.0 pg/mL    Comment: Performed at [facility], [address], [place], [place] [address]  D-dimer, quantitative     Status: Abnormal   Collection Time: 01/12/23 12:40 PM  Result Value Ref Range   D-Dimer, Quant 1.70 (H) 0.00 - 0.50 ug/mL-FEU    Comment: (NOTE) At the manufacturer cut-off value of 0.5 g/mL FEU, this assay has a negative  predictive value of 95-100%.This assay is intended  for use in conjunction with a clinical pretest probability (PTP) assessment model to exclude pulmonary embolism (PE) and deep venous thrombosis (DVT) in outpatients suspected of PE or DVT. Results should be correlated with clinical presentation. Performed at [facility], [address] [place], [place] [address]    *Note: Due to a large number of results and/or encounters for the requested time period, some results have not been displayed. A complete set of results can be found in Results Review.   CT Angio Chest PE W and/or Wo Contrast Result Date: 01/12/2023 CLINICAL DATA:  Pulmonary embolism (PE) suspected, high prob. Acute onset of dizziness precipitating a fall while walking. EXAM: CT ANGIOGRAPHY CHEST WITH CONTRAST TECHNIQUE: Multidetector CT imaging of the chest was performed using the standard protocol during bolus administration of intravenous contrast. Multiplanar CT image reconstructions and MIPs were obtained to evaluate the vascular anatomy. RADIATION DOSE REDUCTION: This exam was performed according to the departmental dose-optimization program which includes automated exposure control, adjustment of the mA and/or kV according to patient size and/or use of iterative reconstruction technique. CONTRAST:  75mL OMNIPAQUE IOHEXOL 350 MG/ML SOLN COMPARISON:  CTA chest/abdomen/pelvis 05/16/2022. FINDINGS: Cardiovascular: Suboptimal timing of the contrast bolus with satisfactory opacification of the pulmonary arteries to the lobar level. No convincing evidence of pulmonary embolism. Enlarged main pulmonary artery, suggestive of pulmonary hypertension. Mediastinum/Nodes: No enlarged mediastinal, hilar, or axillary lymph nodes. Thyroid gland, trachea, and esophagus demonstrate no significant findings. Lungs/Pleura: Unchanged 5 mm subpleural nodular opacity in the lateral aspect of the superior left lower lobe segment (axial image 59 series  6). Patchy peripheral opacities in the basilar left lower lobe may be of infectious or inflammatory etiology. Subsegmental atelectasis in the lingula. No pleural effusion or pneumothorax. Upper Abdomen: No acute findings. Musculoskeletal: No chest wall abnormality. No acute or significant osseous findings. Review of the MIP images confirms the above findings. IMPRESSION: 1. Suboptimal timing of the contrast bolus with satisfactory opacification of the pulmonary arteries to the lobar level. No convincing evidence of pulmonary embolism. 2. Patchy peripheral opacities in the basilar left lower lobe may be of infectious or inflammatory etiology. 3. Unchanged 5 mm subpleural nodular opacity in the lateral aspect of the superior left lower lobe segment. 4. Enlarged main pulmonary artery, suggestive of pulmonary hypertension. Electronically Signed   By: Orvan Falconer M.D.   On: 01/12/2023 15:10   CT HEAD WO CONTRAST ( ) Result Date: 01/12/2023 CLINICAL DATA:  Head trauma, moderate-severe. Fall with head strike. EXAM: CT HEAD WITHOUT CONTRAST TECHNIQUE: Contiguous axial images were obtained from the base of the skull through the vertex without intravenous contrast. RADIATION DOSE REDUCTION: This exam was performed according to the departmental dose-optimization program which includes automated exposure control, adjustment of the mA and/or kV according to patient size and/or use of iterative reconstruction technique. COMPARISON:  CTA head/neck and MRI brain 05/16/2022. FINDINGS: Brain: No acute intracranial hemorrhage. Gray-white differentiation is preserved. No hydrocephalus or extra-axial collection. No mass effect or midline shift. Vascular: No hyperdense vessel or unexpected calcification. Skull: No calvarial fracture or suspicious bone lesion. Skull base is unremarkable. Sinuses/Orbits: No acute finding. Other: None. IMPRESSION: No acute intracranial abnormality. Electronically Signed   By: Orvan Falconer M.D.    On: 01/12/2023 15:00   DG Foot Complete Left Result Date: 01/12/2023 CLINICAL DATA:  Fall.  Left foot pain. EXAM: LEFT FOOT - COMPLETE 3+ VIEW COMPARISON:  Left foot radiographs 08/26/2014 FINDINGS: Mild hallux valgus is similar to prior. Minimal medial  greater than lateral great toe metatarsophalangeal degenerative spurring. There are new linear lucencies indicating acute fractures of the second through fifth metatarsal necks and portions of the metatarsal heads. There is up to 1-2 mm cortical step-off at the lateral aspect of each metatarsal neck. Mild distal medial talar neck degenerative spurring. Mild dorsal talar neck degenerative osteophytosis with mild adjacent posterior dorsal navicular degenerative osteophytosis. Small plantar and posterior calcaneal heel spurs. Mild pes planus. IMPRESSION: 1. Acute fractures of the second through fifth metatarsal necks and portions of the metatarsal heads. 2. Mild hallux valgus. 3. Mild pes planus. Electronically Signed   By: Neita Garnet M.D.   On: 01/12/2023 14:35   DG Chest Port 1 View Result Date: 01/12/2023 CLINICAL DATA:  Near syncope EXAM: PORTABLE CHEST 1 VIEW COMPARISON:  05/16/2022 FINDINGS: The heart size and mediastinal contours are within normal limits. Hazy attenuation over the bilateral lung bases is favored to be secondary to summation of overlying soft tissues. Mild bibasilar atelectasis or edema are not excluded. No appreciable pleural fluid collection. No pneumothorax. The visualized skeletal structures are unremarkable. IMPRESSION: Hazy attenuation over the bilateral lung bases is favored to be secondary to summation of overlying soft tissues. Mild bibasilar atelectasis or edema are not excluded. Electronically Signed   By: Duanne Guess D.O.   On: 01/12/2023 13:38    Pending Labs Unresulted Labs (From admission, onward)     Start     Ordered   01/13/23 0500  Basic metabolic panel  Tomorrow morning,   R        01/12/23 1736    01/13/23 0500  CBC  Tomorrow morning,   R        01/12/23 1736   01/12/23 1244  Urinalysis, Routine w reflex microscopic -Urine, Clean Catch  ONCE - STAT,   URGENT       Question:  Specimen Source  Answer:  Urine, Clean Catch   01/12/23 1244            Vitals/Pain Today's Vitals   01/12/23 1408 01/12/23 1430 01/12/23 1520 01/12/23 1730  BP: (!) 90/54 93/68 112/77 (!) 124/101  Pulse: 87 79 83 89  Resp: (!) 25 19 (!) 23 (!) 22  Temp:      TempSrc:      SpO2: 97% 94% 98% 98%  Weight:      Height:      PainSc:        Isolation Precautions No active isolations  Medications Medications  enoxaparin (LOVENOX) injection 70 mg (has no administration in time range)  senna-docusate (Senokot-S) tablet 1 tablet (has no administration in time range)  acetaminophen (TYLENOL) tablet 1,000 mg (has no administration in time range)  oxyCODONE (Oxy IR/ROXICODONE) immediate release tablet 5 mg (has no administration in time range)  sodium chloride 0.9 % bolus 1,000 mL (0 mLs Intravenous Stopped 01/12/23 1618)  iohexol (OMNIPAQUE) 350 MG/ML injection 75 mL (75 mLs Intravenous Contrast Given 01/12/23 1443)  fentaNYL (SUBLIMAZE) injection 50 mcg (50 mcg Intravenous Given 01/12/23 1534)    Mobility non-ambulatory     Focused Assessments Cardiac Assessment Handoff:    Lab Results  Component Value Date   TROPONINI <0.03 06/24/2018   Lab Results  Component Value Date   DDIMER 1.70 (H) 01/12/2023   Does the Patient currently have chest pain? No    R Recommendations: See Admitting Provider Note  Report given to:   Additional Notes:

## 2023-01-12 NOTE — ED Provider Notes (Addendum)
[place] EMERGENCY DEPARTMENT AT [facility] Provider Note   CSN: [id number] Arrival date & time: 01/12/23  1233     History  Chief Complaint  Patient presents with   Myrtie Hawk is a 52 y.o. female.   Fall Associated symptoms include shortness of breath.     18 female with medical history significant for CHF last EF 45%, morbid obesity, fibromyalgia, chronic respiratory failure on 2 L O2 at baseline, COPD who presents to the emergency department with a near syncopal episode and fall.  The patient states that she has not been taking her home diuretic for the past month because she could not find it.  She states that she has had some dyspnea and difficulty lying flat.  She also states that she was feeling a sensation of disequilibrium over the past 24 hours.  She felt lightheaded today.  She felt lightheaded while walking today and fell striking the back of her head, she denies any loss of consciousness.  She is not on anticoagulation.  She denies any chest pain.  She denies any lower extremity swelling.  She endorses pain in her left foot.  She denies any current room spinning dizziness or focal neurologic deficits.  Home Medications Prior to Admission medications   Medication Sig Start Date End Date Taking? Authorizing Provider  Brexpiprazole (REXULTI) 3 MG TABS Take 1 tablet (3 mg total) by mouth daily. 12/24/22  Yes [name], Phillips Grout, MD  lamoTRIgine (LAMICTAL) 100 MG tablet Take 1 tablet (100 mg total) by mouth daily. 12/24/22  Yes [name], Phillips Grout, MD  linaclotide [facility]) 290 MCG CAPS capsule Take 1 capsule (290 mcg total) by mouth daily before breakfast. 12/03/22 03/03/23 Yes Quentin Mulling R, PA-C  metoprolol succinate (TOPROL-XL) 50 MG 24 hr tablet Take 1 tablet (50 mg total) by mouth daily. 05/23/21  Yes [name], MD  omeprazole (PRILOSEC) 40 MG capsule Take 1 capsule (40 mg total) by mouth 2 (two) times daily. 12/03/22  Yes Quentin Mulling R, PA-C   promethazine (PHENERGAN) 25 MG tablet TAKE 1 TABLET BY MOUTH EVERY 6 HOURS AS NEEDED FOR NAUSEA OR VOMITING. 01/06/23  Yes Quentin Mulling R, PA-C  tiZANidine (ZANAFLEX) 4 MG tablet Take 1 tablet (4 mg total) by mouth every 8 (eight) hours as needed for muscle spasms. 09/04/22  Yes [name], MD  torsemide (DEMADEX) 20 MG tablet Take 2 tablets (40 mg total) by mouth 2 (two) times daily. 08/05/22  Yes [name], MD  traMADol (ULTRAM) 50 MG tablet Take 1 tablet (50 mg total) by mouth every 6 (six) hours as needed. 12/04/22  Yes [name], MD  ACCU-CHEK GUIDE test strip USE ONE TEST STRIP TO CHECK BLOOD SUGARS ONCE A DAY AS NEEDED 10/16/22   [name], MD  Accu-Chek Softclix Lancets lancets USE ONE LANCET TO CHECK BLOOD SUGAR ONE A DAY AS NEEDED 07/03/22   [name], MD  acetaminophen (TYLENOL) 500 MG tablet Take 1 tablet (500 mg total) by mouth every 6 (six) hours as needed. Patient not taking: Reported on 12/03/2022 09/17/22   Derwood Kaplan, MD  albuterol (PROVENTIL) (2.5 MG/3ML) 0.083% nebulizer solution Take 3 mLs (2.5 mg total) by nebulization every 6 (six) hours as needed for wheezing or shortness of breath. 11/06/22   [name], MD  albuterol (VENTOLIN HFA) 108 (90 Base) MCG/ACT inhaler TAKE 2 PUFFS BY MOUTH EVERY 6 HOURS AS NEEDED FOR WHEEZE OR SHORTNESS OF BREATH 05/20/22   Olalere, Onnie Boer  A, MD  atorvastatin (LIPITOR) 40 MG tablet TAKE 1 TABLET BY MOUTH EVERY DAY 04/30/22   [name], MD  azelastine (ASTELIN) 0.1 % nasal spray Place 1 spray into both nostrils 2 (two) times daily. Use in each nostril as directed 01/17/22   Marrianne Mood, MD  benzonatate (TESSALON PERLES) 100 MG capsule Take 1 capsule (100 mg total) by mouth every 6 (six) hours as needed for cough. 10/07/22 10/07/23  Morrie Sheldon, MD  Blood Glucose Monitoring Suppl (ACCU-CHEK GUIDE ME) w/Device KIT Dispense one device 02/16/20   [name], MD  cetirizine (ZYRTEC) 10 MG tablet  Take 1 tablet (10 mg total) by mouth daily. 08/05/22   [name], MD  clindamycin (CLINDAGEL) 1 % gel APPLY TO AFFECTED AREA TWICE A DAY Patient not taking: Reported on 12/03/2022 08/21/22   [name], MD  [employer] D3 25 MCG (1000 UT) capsule TAKE 1 CAPSULE BY MOUTH EVERY DAY 02/26/22   [name], MD  dicyclomine (BENTYL) 10 MG capsule Take 2 capsules (20 mg total) by mouth 3 (three) times daily as needed for spasms (abdominal pain). 12/03/22   Doree Albee, PA-C  dicyclomine (BENTYL) 10 MG capsule Take 1 capsule (10 mg total) by mouth 3 (three) times daily as needed for spasms. 01/05/23   Arnaldo Natal, NP  doxepin (SINEQUAN) 25 MG capsule Take 1 capsule (25 mg total) by mouth at bedtime. 12/24/22   [name], Phillips Grout, MD  ENTRESTO 49-51 MG Take 1 tablet by mouth 2 (two) times daily.    [provider]  famotidine (PEPCID) 20 MG tablet Take 1 tablet (20 mg total) by mouth daily. Patient not taking: Reported on 12/03/2022 08/05/22   [name], MD  fluticasone [facility]) 50 MCG/ACT nasal spray SPRAY 1 SPRAY INTO EACH NOSTRIL DAILY 09/24/22   [name], MD  hydrocortisone (ANUSOL-HC) 2.5 % rectal cream Place 1 Application rectally 2 (two) times daily. 12/03/22   Doree Albee, PA-C  hydrocortisone (ANUSOL-HC) 25 MG suppository Place 1 suppository (25 mg total) rectally 2 (two) times daily. 12/03/22   Quentin Mulling R, PA-C  ipratropium (ATROVENT) 0.06 % nasal spray PLEASE SEE ATTACHED FOR DETAILED DIRECTIONS 04/14/20   [provider]  meclizine (ANTIVERT) 25 MG tablet Take 1 tablet (25 mg total) by mouth 3 (three) times daily as needed for dizziness. 12/04/22   [name], MD  megestrol (MEGACE) 40 MG tablet Take 2 tablets (80 mg total) by mouth daily. 06/10/22   Reva Bores, MD  naproxen (NAPROSYN) 500 MG tablet TAKE 1 TABLET (500 MG TOTAL) BY MOUTH 2 (TWO) TIMES DAILY WITH A MEAL. DON'T USE FOR MORE THAN 3 DAYS CONTINUOUSLY. 12/23/22  12/23/23  [name], MD  ondansetron (ZOFRAN-ODT) 8 MG disintegrating tablet Take 1 tablet (8 mg total) by mouth every 8 (eight) hours as needed. 11/23/22   Marrianne Mood, MD  polyethylene glycol-electrolytes (NULYTELY) 420 g solution Do 1/2 of the prep day 1 and the other 1/2 day two 12/03/22   Doree Albee, PA-C  promethazine (PHENERGAN) 12.5 MG tablet Take 1 tablet (12.5 mg total) by mouth every 12 (twelve) hours as needed for nausea or vomiting. 01/05/23   Arnaldo Natal, NP  Semaglutide, 2 MG/DOSE, (OZEMPIC, 2 MG/DOSE,) 8 MG/3ML SOPN Inject 2 mg into the skin once a week. 08/05/22   [name], MD  SUMAtriptan (IMITREX) 50 MG tablet Take 1 tablet (50 mg total) by mouth every 2 (two) hours as needed for up to 10  doses for migraine. May repeat in 2 hours if headache persists or recurs. Patient not taking: Reported on 12/03/2022 08/15/22   [name], MD      Allergies    Lisinopril    Review of Systems   Review of Systems  Respiratory:  Positive for shortness of breath.   Neurological:  Positive for light-headedness.  All other systems reviewed and are negative.   Physical Exam Updated Vital Signs BP 112/77 (BP Location: Right Arm)   Pulse 83   Temp 98.1 F (36.7 C) (Oral)   Resp (!) 23   Ht 5\' 10"  (1.778 m)   Wt (!) 141 kg   SpO2 98%   BMI 44.60 kg/m  Physical Exam Vitals and nursing note reviewed.  Constitutional:      General: She is not in acute distress.    Appearance: She is well-developed. She is obese.     Comments: GCS 15, ABC intact  HENT:     Head: Normocephalic and atraumatic.  Eyes:     Extraocular Movements: Extraocular movements intact.     Conjunctiva/sclera: Conjunctivae normal.     Pupils: Pupils are equal, round, and reactive to light.  Neck:     Vascular: No JVD.     Comments: No JVD Cardiovascular:     Rate and Rhythm: Normal rate and regular rhythm.  Pulmonary:     Effort: Pulmonary effort is normal. No  respiratory distress.     Breath sounds: Normal breath sounds.     Comments: Lungs clear Chest:     Comments: Clavicles stable nontender to AP compression.  Chest wall stable and nontender to AP and lateral compression. Abdominal:     Palpations: Abdomen is soft.     Tenderness: There is no abdominal tenderness.     Comments: Pelvis stable to lateral compression  Musculoskeletal:        General: No swelling.     Cervical back: Neck supple.     Comments: No midline tenderness to palpation of the thoracic or lumbar spine.  Extremities atraumatic with intact range of motion with the exception of mild tenderness about the dorsum of the left foot  Skin:    General: Skin is warm and dry.     Capillary Refill: Capillary refill takes less than 2 seconds.  Neurological:     Mental Status: She is alert.     Comments: Cranial nerves II through XII grossly intact.  Moving all 4 extremities spontaneously.  Sensation grossly intact all 4 extremities  Psychiatric:        Mood and Affect: Mood normal.     ED Results / Procedures / Treatments   Labs (all labs ordered are listed, but only abnormal results are displayed) Labs Reviewed  CBC WITH DIFFERENTIAL/PLATELET - Abnormal; Notable for the following components:      Result Value   Hemoglobin 10.2 (*)    HCT 33.3 (*)    RDW 16.1 (*)    All other components within normal limits  COMPREHENSIVE METABOLIC PANEL - Abnormal; Notable for the following components:   Creatinine, Ser 1.30 (*)    Total Protein 6.1 (*)    Albumin 3.2 (*)    AST 12 (*)    GFR, Estimated 49 (*)    All other components within normal limits  D-DIMER, QUANTITATIVE - Abnormal; Notable for the following components:   D-Dimer, Quant 1.70 (*)    All other components within normal limits  BRAIN NATRIURETIC PEPTIDE  URINALYSIS, ROUTINE  W REFLEX MICROSCOPIC  CBG MONITORING, ED  TROPONIN I (HIGH SENSITIVITY)    EKG EKG Interpretation Date/Time:  Sunday January 12 2023  12:43:23 EST Ventricular Rate:  84 PR Interval:  166 QRS Duration:  98 QT Interval:  406 QTC Calculation: 480 R Axis:   50  Text Interpretation: Sinus rhythm Borderline T wave abnormalities No significant change since last tracing Confirmed by Ernie Avena ([id number]) on 01/12/2023 12:52:43 PM  Radiology CT Angio Chest PE W and/or Wo Contrast Result Date: 01/12/2023 CLINICAL DATA:  Pulmonary embolism (PE) suspected, high prob. Acute onset of dizziness precipitating a fall while walking. EXAM: CT ANGIOGRAPHY CHEST WITH CONTRAST TECHNIQUE: Multidetector CT imaging of the chest was performed using the standard protocol during bolus administration of intravenous contrast. Multiplanar CT image reconstructions and MIPs were obtained to evaluate the vascular anatomy. RADIATION DOSE REDUCTION: This exam was performed according to the departmental dose-optimization program which includes automated exposure control, adjustment of the mA and/or kV according to patient size and/or use of iterative reconstruction technique. CONTRAST:  75mL OMNIPAQUE IOHEXOL 350 MG/ML SOLN COMPARISON:  CTA chest/abdomen/pelvis 05/16/2022. FINDINGS: Cardiovascular: Suboptimal timing of the contrast bolus with satisfactory opacification of the pulmonary arteries to the lobar level. No convincing evidence of pulmonary embolism. Enlarged main pulmonary artery, suggestive of pulmonary hypertension. Mediastinum/Nodes: No enlarged mediastinal, hilar, or axillary lymph nodes. Thyroid gland, trachea, and esophagus demonstrate no significant findings. Lungs/Pleura: Unchanged 5 mm subpleural nodular opacity in the lateral aspect of the superior left lower lobe segment (axial image 59 series 6). Patchy peripheral opacities in the basilar left lower lobe may be of infectious or inflammatory etiology. Subsegmental atelectasis in the lingula. No pleural effusion or pneumothorax. Upper Abdomen: No acute findings. Musculoskeletal: No chest wall  abnormality. No acute or significant osseous findings. Review of the MIP images confirms the above findings. IMPRESSION: 1. Suboptimal timing of the contrast bolus with satisfactory opacification of the pulmonary arteries to the lobar level. No convincing evidence of pulmonary embolism. 2. Patchy peripheral opacities in the basilar left lower lobe may be of infectious or inflammatory etiology. 3. Unchanged 5 mm subpleural nodular opacity in the lateral aspect of the superior left lower lobe segment. 4. Enlarged main pulmonary artery, suggestive of pulmonary hypertension. Electronically Signed   By: Orvan Falconer M.D.   On: 01/12/2023 15:10   CT HEAD WO CONTRAST ( ) Result Date: 01/12/2023 CLINICAL DATA:  Head trauma, moderate-severe. Fall with head strike. EXAM: CT HEAD WITHOUT CONTRAST TECHNIQUE: Contiguous axial images were obtained from the base of the skull through the vertex without intravenous contrast. RADIATION DOSE REDUCTION: This exam was performed according to the departmental dose-optimization program which includes automated exposure control, adjustment of the mA and/or kV according to patient size and/or use of iterative reconstruction technique. COMPARISON:  CTA head/neck and MRI brain 05/16/2022. FINDINGS: Brain: No acute intracranial hemorrhage. Gray-white differentiation is preserved. No hydrocephalus or extra-axial collection. No mass effect or midline shift. Vascular: No hyperdense vessel or unexpected calcification. Skull: No calvarial fracture or suspicious bone lesion. Skull base is unremarkable. Sinuses/Orbits: No acute finding. Other: None. IMPRESSION: No acute intracranial abnormality. Electronically Signed   By: Orvan Falconer M.D.   On: 01/12/2023 15:00   DG Foot Complete Left Result Date: 01/12/2023 CLINICAL DATA:  Fall.  Left foot pain. EXAM: LEFT FOOT - COMPLETE 3+ VIEW COMPARISON:  Left foot radiographs 08/26/2014 FINDINGS: Mild hallux valgus is similar to prior. Minimal  medial greater than lateral great toe metatarsophalangeal degenerative spurring. There  are new linear lucencies indicating acute fractures of the second through fifth metatarsal necks and portions of the metatarsal heads. There is up to 1-2 mm cortical step-off at the lateral aspect of each metatarsal neck. Mild distal medial talar neck degenerative spurring. Mild dorsal talar neck degenerative osteophytosis with mild adjacent posterior dorsal navicular degenerative osteophytosis. Small plantar and posterior calcaneal heel spurs. Mild pes planus. IMPRESSION: 1. Acute fractures of the second through fifth metatarsal necks and portions of the metatarsal heads. 2. Mild hallux valgus. 3. Mild pes planus. Electronically Signed   By: Neita Garnet M.D.   On: 01/12/2023 14:35   DG Chest Port 1 View Result Date: 01/12/2023 CLINICAL DATA:  Near syncope EXAM: PORTABLE CHEST 1 VIEW COMPARISON:  05/16/2022 FINDINGS: The heart size and mediastinal contours are within normal limits. Hazy attenuation over the bilateral lung bases is favored to be secondary to summation of overlying soft tissues. Mild bibasilar atelectasis or edema are not excluded. No appreciable pleural fluid collection. No pneumothorax. The visualized skeletal structures are unremarkable. IMPRESSION: Hazy attenuation over the bilateral lung bases is favored to be secondary to summation of overlying soft tissues. Mild bibasilar atelectasis or edema are not excluded. Electronically Signed   By: Duanne Guess D.O.   On: 01/12/2023 13:38    Procedures Procedures    Medications Ordered in ED Medications  sodium chloride 0.9 % bolus 1,000 mL (0 mLs Intravenous Stopped 01/12/23 1618)  iohexol (OMNIPAQUE) 350 MG/ML injection 75 mL (75 mLs Intravenous Contrast Given 01/12/23 1443)  fentaNYL (SUBLIMAZE) injection 50 mcg (50 mcg Intravenous Given 01/12/23 1534)    ED Course/ Medical Decision Making/ A&P                                 Medical  Decision Making Amount and/or Complexity of Data Reviewed Labs: ordered. Radiology: ordered. ECG/medicine tests: ordered.  Risk Prescription drug management. Decision regarding hospitalization.    9 female with medical history significant for CHF last EF 45%, morbid obesity, fibromyalgia, chronic respiratory failure on 2 L O2 at baseline, COPD who presents to the emergency department with a near syncopal episode and fall.  The patient states that she has not been taking her home diuretic for the past month because she could not find it.  She states that she has had some dyspnea and difficulty lying flat.  She also states that she was feeling a sensation of disequilibrium over the past 24 hours.  She felt lightheaded today.  She felt lightheaded while walking today and fell striking the back of her head, she denies any loss of consciousness.  She is not on anticoagulation.  She denies any chest pain.  She denies any lower extremity swelling.  She endorses pain in her left foot.  She denies any current room spinning dizziness or focal neurologic deficits.  On arrival, the patient was afebrile, not tachycardic or tachypneic, hypertensive BP 87/59, saturating 96% on room air.  Patient with an intact neurologic exam.  Having disequilibrium and lightheadedness with mild shortness of breath as well.  Has a history of chronic hypoxic respiratory failure and pulmonary hypertension and is on oxygen 2 L O2 at baseline.  Has a history of heart failure.  Was near syncopal today when she fell.  EKG reveals sinus rhythm, ventricular rate 84, borderline T wave abnormalities, no acute ischemic changes.  CT of the head was performed which revealed no acute intracranial  abnormality.  Patient C-spine cleared by Nexus criteria.  Laboratory evaluation revealed cardiac troponin 5, D-dimer elevated at 1.7, CMP with mildly elevated serum creatinine to 1.3, no other significant electrolyte abnormality, BNP normal, CBC  without a leukocytosis, mild anemia to 10.2 present.  The patient was fluid resuscitated with only 500 cc of NaCl.  She was administered fentanyl for pain control.  CXR: IMPRESSION:  Hazy attenuation over the bilateral lung bases is favored to be  secondary to summation of overlying soft tissues. Mild bibasilar  atelectasis or edema are not excluded.      XR Foot: IMPRESSION:  1. Acute fractures of the second through fifth metatarsal necks and  portions of the metatarsal heads.  2. Mild hallux valgus.  3. Mild pes planus.    CTA PE: IMPRESSION:  1. Suboptimal timing of the contrast bolus with satisfactory  opacification of the pulmonary arteries to the lobar level. No  convincing evidence of pulmonary embolism.  2. Patchy peripheral opacities in the basilar left lower lobe may be  of infectious or inflammatory etiology.  3. Unchanged 5 mm subpleural nodular opacity in the lateral aspect  of the superior left lower lobe segment.  4. Enlarged main pulmonary artery, suggestive of pulmonary  hypertension.    In the setting of the patient's hypotension, lightheadedness and disequilibrium and near syncope with resultant fall, generalized weakness, internal medicine teaching service was consulted for admission.  Regarding the patient's foot fractures, the patient was placed in a cam boot and provided fentanyl for pain control.  Location of fractures too distal along the metatarsal bones to be considered Lisfranc, do not think think CT imaging is warranted at this time, patient would benefit from outpatient orthopedic follow-up, PT/OT while inpatient.   Internal medicine requested orthopedics consult: spoke with Dr. Charlann Boxer who will see the patient in consultation, agreed with plan of care. Patient stable at time of admission.     Final Clinical Impression(s) / ED Diagnoses Final diagnoses:  Fall, initial encounter  Near syncope  Pulmonary hypertension (HCC)  Closed nondisplaced  fracture of metatarsal bone of left foot, unspecified metatarsal, initial encounter  Hypotension, unspecified hypotension type    Rx / DC Orders ED Discharge Orders     None         Ernie Avena, MD 01/12/23 1713    Ernie Avena, MD 01/12/23 1732

## 2023-01-13 ENCOUNTER — Other Ambulatory Visit: Payer: Self-pay

## 2023-01-13 DIAGNOSIS — S92302A Fracture of unspecified metatarsal bone(s), left foot, initial encounter for closed fracture: Secondary | ICD-10-CM

## 2023-01-13 DIAGNOSIS — M84475A Pathological fracture, left foot, initial encounter for fracture: Secondary | ICD-10-CM

## 2023-01-13 DIAGNOSIS — I959 Hypotension, unspecified: Secondary | ICD-10-CM

## 2023-01-13 DIAGNOSIS — R55 Syncope and collapse: Secondary | ICD-10-CM | POA: Diagnosis not present

## 2023-01-13 DIAGNOSIS — S92342A Displaced fracture of fourth metatarsal bone, left foot, initial encounter for closed fracture: Secondary | ICD-10-CM | POA: Diagnosis not present

## 2023-01-13 DIAGNOSIS — S92322A Displaced fracture of second metatarsal bone, left foot, initial encounter for closed fracture: Secondary | ICD-10-CM | POA: Diagnosis not present

## 2023-01-13 DIAGNOSIS — S92332A Displaced fracture of third metatarsal bone, left foot, initial encounter for closed fracture: Secondary | ICD-10-CM | POA: Diagnosis not present

## 2023-01-13 DIAGNOSIS — S92352A Displaced fracture of fifth metatarsal bone, left foot, initial encounter for closed fracture: Secondary | ICD-10-CM | POA: Diagnosis not present

## 2023-01-13 LAB — URINALYSIS, ROUTINE W REFLEX MICROSCOPIC
Bilirubin Urine: NEGATIVE
Glucose, UA: NEGATIVE mg/dL
Hgb urine dipstick: NEGATIVE
Ketones, ur: NEGATIVE mg/dL
Leukocytes,Ua: NEGATIVE
Nitrite: NEGATIVE
Protein, ur: NEGATIVE mg/dL
Specific Gravity, Urine: 1.038 — ABNORMAL HIGH (ref 1.005–1.030)
pH: 5 (ref 5.0–8.0)

## 2023-01-13 LAB — BASIC METABOLIC PANEL
Anion gap: 9 (ref 5–15)
BUN: 7 mg/dL (ref 6–20)
CO2: 21 mmol/L — ABNORMAL LOW (ref 22–32)
Calcium: 8.8 mg/dL — ABNORMAL LOW (ref 8.9–10.3)
Chloride: 105 mmol/L (ref 98–111)
Creatinine, Ser: 1.25 mg/dL — ABNORMAL HIGH (ref 0.44–1.00)
GFR, Estimated: 52 mL/min — ABNORMAL LOW (ref >60)
Glucose, Bld: 105 mg/dL — ABNORMAL HIGH (ref 70–99)
Potassium: 3.7 mmol/L (ref 3.5–5.1)
Sodium: 135 mmol/L (ref 135–145)

## 2023-01-13 LAB — CBC
HCT: 31.6 % — ABNORMAL LOW (ref 36.0–46.0)
Hemoglobin: 10.1 g/dL — ABNORMAL LOW (ref 12.0–15.0)
MCH: 26.8 pg (ref 26.0–34.0)
MCHC: 32 g/dL (ref 30.0–36.0)
MCV: 83.8 fL (ref 80.0–100.0)
Platelets: 330 10*3/uL (ref 150–400)
RBC: 3.77 MIL/uL — ABNORMAL LOW (ref 3.87–5.11)
RDW: 15.9 % — ABNORMAL HIGH (ref 11.5–15.5)
WBC: 8 10*3/uL (ref 4.0–10.5)
nRBC: 0 % (ref 0.0–0.2)

## 2023-01-13 MED ORDER — PROMETHAZINE HCL 25 MG PO TABS
25.0000 mg | ORAL_TABLET | Freq: Four times a day (QID) | ORAL | Status: DC | PRN
  Administered 2023-01-13 – 2023-01-14 (×3): 25 mg via ORAL
  Filled 2023-01-13 (×5): qty 1

## 2023-01-13 MED ORDER — INFLUENZA VIRUS VACC SPLIT PF (FLUZONE) 0.5 ML IM SUSY
0.5000 mL | PREFILLED_SYRINGE | INTRAMUSCULAR | Status: AC
  Administered 2023-01-14: 0.5 mL via INTRAMUSCULAR
  Filled 2023-01-13: qty 0.5

## 2023-01-13 MED ORDER — PNEUMOCOCCAL 20-VAL CONJ VACC 0.5 ML IM SUSY
0.5000 mL | PREFILLED_SYRINGE | INTRAMUSCULAR | Status: AC
  Administered 2023-01-14: 0.5 mL via INTRAMUSCULAR
  Filled 2023-01-13: qty 0.5

## 2023-01-13 MED ORDER — METOPROLOL SUCCINATE ER 50 MG PO TB24
50.0000 mg | ORAL_TABLET | Freq: Every day | ORAL | Status: DC
  Administered 2023-01-13 – 2023-01-14 (×2): 50 mg via ORAL
  Filled 2023-01-13 (×2): qty 1

## 2023-01-13 MED ORDER — OXYCODONE HCL 5 MG PO TABS
5.0000 mg | ORAL_TABLET | ORAL | Status: DC
  Administered 2023-01-13 – 2023-01-14 (×7): 5 mg via ORAL
  Filled 2023-01-13 (×7): qty 1

## 2023-01-13 NOTE — Progress Notes (Signed)
HD#0 SUBJECTIVE:  Patient Summary: Lindsay [name] is a 52 y.o. with a pertinent PMH of HFrEF last EF 45%, chronic respiratory failure on 2 L of oxygen at baseline, COPD who presented to the ED with a near syncopal episode of the fall.   Overnight Events: NAEO  Interim History: Patient was evaluated at bedside. Endorses continued pain in foot but improvement with oxycodone. Denies any chest pain, SOB, troubles breathing, or any other signs or symptoms.   OBJECTIVE:  Vital Signs: Vitals:   01/12/23 1856 01/12/23 2116 01/13/23 0548 01/13/23 0745  BP: 132/72 110/80 126/78 114/69  Pulse: 94 94  95  Resp: 17   18  Temp: 100.2 F (37.9 C) 98.6 F (37 C) 98.7 F (37.1 C) 98.7 F (37.1 C)  TempSrc: Oral Oral Oral   SpO2: 96% 96%  98%  Weight: (!) 142 kg     Height: 5\' 11"  (1.803 m)      Supplemental O2: Nasal Cannula SpO2: 98 % O2 Flow Rate (L/min): 3 L/min  Filed Weights   01/12/23 1240 01/12/23 1856  Weight: (!) 141 kg (!) 142 kg    No intake or output data in the 24 hours ending 01/13/23 1042 Net IO Since Admission: No IO data has been entered for this period [01/13/23 1042]  Physical Exam: Physical Exam Constitutional:      Appearance: Normal appearance.  HENT:     Head: Normocephalic and atraumatic.  Cardiovascular:     Rate and Rhythm: Normal rate and regular rhythm.  Pulmonary:     Effort: Pulmonary effort is normal.     Breath sounds: Normal breath sounds.  Abdominal:     General: Abdomen is flat. Bowel sounds are normal.     Palpations: Abdomen is soft.  Musculoskeletal:     Comments: Dorsum of left foot tender to palpation.  Mild tenderness to medial left thigh. No gross deformity of left forefoot  Neurological:     General: No focal deficit present.     Mental Status: She is alert and oriented to person, place, and time.    ASSESSMENT/PLAN:  Assessment: Principal Problem:   Near syncope  Lindsay [name] is a 52 y.o. with a pertinent PMH of HFrEF last  EF 45%, chronic respiratory failure on 2 L of oxygen at baseline, COPD who presented to the ED with a near syncopal episode of the fall.   Plan: #Near Syncope  Remains normotensive with systolic in 120s-130s. Creatinine stable 1.25. Baseline appears to be ~1. CTA noted enlargement of main pulmonary artery suggestive of pulmonary HTN. Last echo completed in 2021 so will order TTE for further workup. Patient notes that she continues to take her torsemide and that she last took it on Thursday. Etiology remains multifactorial but likely in the setting of hypovolemia and deconditioning  -F/U echo  -Continue to hold meclizine  -F/U PT/OT  #Fracture of 2nd-5th Metatarsal Necks Ortho consulted and does not recommend surgical intervention. Pain remains well controlled with scheduled Tylenol and PRN oxycodone.  -Cam walker boot to cover toes  -WBAT LLE -RTC in 2 weeks with Odis Hollingshead ([facility]) -Continue Tylenol 1000 mg q8hrs and oxycodone 5 mg PRN   #HFrEF Last echo 10/21 showed LVEF of 45-50% w/ left ventricular hypertrophy. Remains euvolemic. Home regiment includes metoprolol, torsemide, Entresto  -Continue to hold torsemide -Continue entrestro 41/51 and metoprolol succinate 50 mg  #Normocytic Anemia  Hgb stable at 10.1 this morning. Baseline appears to be ~10. Denies any symptoms.  -Trend  CBC   Chronic stable medical conditions. #Depression/anxiety.  - Doxepin 25 mg. - brexpiprazole 3 mg daily   #Type 2 Diabetes.  Stable. A1c 5.6 about 6 months ago.     #Obesity BMI 44.6, she is on weekly Ozempic 2 mg.   #GERD -Omeprazole 40 mg twice daily.   #IBS -Linzess to 290 mcg.   #Hyperlipidemia - Atorvastatin 40 mg.  #Hidradenitis suppurativa -Clindamycin gel twice daily as needed.  Best Practice: Diet: Regular diet VTE: Enoxaparin Code: Full AB: None Therapy Recs:  TBD , DME: none DISPO: Anticipated discharge  TBD  to  TBD  pending  medication management  .  Signature: Morrie Sheldon, MD Internal Medicine Resident, PGY-1 [facility]  Pager: [phone]  Please contact the on call pager after 5 pm and on weekends at [phone].

## 2023-01-13 NOTE — Progress Notes (Addendum)
Syncope. Hx of HFrEF, chronic respiratory failure on 2 L of oxygen at baseline, COPD    01/13/23 0948  TOC Brief Assessment  Insurance and Status Reviewed  Patient has primary care physician No  Home environment has been reviewed From home with mom/ daughter. States receives PCS qd for 3 hrs,  [employer].  Prior level of function: PTA  independent with ADL's. States rollator broken @ home. (broken rollator)  Prior/Current Home Services No current home services  Social Drivers of Health Review SDOH reviewed no interventions necessary  Readmission risk has been reviewed No  Transition of care needs transition of care needs identified, TOC will continue to follow   Pt without RX med concerns or transportation issues. PT/OT evaluation pending.  Gae Gallop RN,BSN,CM [phone]

## 2023-01-13 NOTE — Consult Note (Signed)
Reason for Consult: left foot injury Referring Physician: Ninetta Lights, MD   Lindsay [name] is an 52 y.o. female.  HPI: Lindsay [name] is a 52 year old female with a history of HFrEF last EF 45%, chronic respiratory failure on 2 L of oxygen at baseline, COPD who presented to the ED with a near syncopal episode.   She was seen in the ED on 08/2022 for a similar fall.  This was thought to be secondary to tramadol use, and being on chronic opioids.  Tramadol has been tapered off.   Over the past week, the patient has experienced dyspnea and difficulty lying flat. Additionally, for the last 24 hours, she has felt a sensation of disequilibrium. Today, around 2 to 3 PM, she had an unwitnessed fall while moving from the bathroom to the bedroom. Before the fall, she felt lightheaded but denies experiencing any headache or a sensation of the room spinning. She fell backwards, hitting the back of her head, but she denies losing consciousness.  No arm or leg weakness.  The patient is not taking any blood thinners and does not report any bowel or bladder incontinence or tongue biting following the fall. She was found on the floor by her mother about 10 minutes later, and EMS was called.  She has complaints of left foot pain  Past Medical History:  Diagnosis Date   Acid reflux    Anemia    Iron Def   Anorexia    CHF (congestive heart failure) (HCC)    Chronic kidney disease    Nephrotic syndrome   Colon polyp 2009   Depression with anxiety    Edema leg    Fibroid tumor 04/2022   Fibromyalgia    Hemorrhoids    Hidradenitis suppurativa    Hypertension    IBS (irritable bowel syndrome)    Low back pain    Migraines    Morbidly obese (HCC)    Neuromuscular disorder (HCC)    fibromyalgia   Neuropathy    Panic attacks    Polyp of vocal cord or larynx    Stroke (HCC)    Tonsil pain     Past Surgical History:  Procedure Laterality Date   AXILLARY HIDRADENITIS EXCISION     COLONOSCOPY WITH PROPOFOL N/A  05/25/2015   Procedure: COLONOSCOPY WITH PROPOFOL;  Surgeon: Rachael Fee, MD;  Location: [facility];  Service: Endoscopy;  Laterality: N/A;   COLONOSCOPY WITH PROPOFOL N/A 12/31/2018   Procedure: COLONOSCOPY WITH PROPOFOL;  Surgeon: Rachael Fee, MD;  Location: [facility];  Service: Endoscopy;  Laterality: N/A;   HEMORRHOID SURGERY     with Hidradenitis surgery    INGUINAL HIDRADENITIS EXCISION     TONSILLECTOMY  10/18/2010   by Dr. Annalee Genta   UPPER GASTROINTESTINAL ENDOSCOPY      Family History  Problem Relation Age of Onset   Diabetes Mother    Heart disease Mother        valve leak   Anxiety disorder Mother    Depression Mother    High blood pressure Mother    Kidney failure Mother    Cancer Father        prostate   Heart disease Father    Learning disabilities Sister    Depression Sister    Depression Sister    Anxiety disorder Sister    Colon cancer Neg Hx     Social History:  reports that she quit smoking about 3 years ago. Her smoking use included cigarettes. She started  smoking about 28 years ago. She has a 2.5 pack-year smoking history. She has never used smokeless tobacco. She reports that she does not currently use alcohol. She reports that she does not currently use drugs after having used the following drugs: Marijuana. Frequency: 3.00 times per week.  Allergies:  Allergies  Allergen Reactions   Lisinopril Rash and Cough    Medications: I have reviewed the patient's current medications. Scheduled:  acetaminophen  1,000 mg Oral Q8H   atorvastatin  40 mg Oral QHS   brexpiprazole  3 mg Oral Daily   diclofenac Sodium  4 g Topical QID   doxepin  25 mg Oral QHS   enoxaparin (LOVENOX) injection  70 mg Subcutaneous Q24H   lamoTRIgine  100 mg Oral Daily   pantoprazole  40 mg Oral Daily    Results for orders placed or performed during the hospital encounter of 01/12/23 (from the past 24 hours)  CBC WITH DIFFERENTIAL     Status: Abnormal   Collection  Time: 01/12/23 12:40 PM  Result Value Ref Range   WBC 6.8 4.0 - 10.5 K/uL   RBC 3.91 3.87 - 5.11 MIL/uL   Hemoglobin 10.2 (L) 12.0 - 15.0 g/dL   HCT 16.1 (L) 09.6 - 04.5 %   MCV 85.2 80.0 - 100.0 fL   MCH 26.1 26.0 - 34.0 pg   MCHC 30.6 30.0 - 36.0 g/dL   RDW 40.9 (H) 81.1 - 91.4 %   Platelets 368 150 - 400 K/uL   nRBC 0.0 0.0 - 0.2 %   Neutrophils Relative % 44 %   Neutro Abs 3.0 1.7 - 7.7 K/uL   Lymphocytes Relative 41 %   Lymphs Abs 2.8 0.7 - 4.0 K/uL   Monocytes Relative 11 %   Monocytes Absolute 0.7 0.1 - 1.0 K/uL   Eosinophils Relative 3 %   Eosinophils Absolute 0.2 0.0 - 0.5 K/uL   Basophils Relative 1 %   Basophils Absolute 0.0 0.0 - 0.1 K/uL   Immature Granulocytes 0 %   Abs Immature Granulocytes 0.03 0.00 - 0.07 K/uL  Comprehensive metabolic panel     Status: Abnormal   Collection Time: 01/12/23 12:40 PM  Result Value Ref Range   Sodium 139 135 - 145 mmol/L   Potassium 3.9 3.5 - 5.1 mmol/L   Chloride 105 98 - 111 mmol/L   CO2 25 22 - 32 mmol/L   Glucose, Bld 96 70 - 99 mg/dL   BUN 9 6 - 20 mg/dL   Creatinine, Ser 7.82 (H) 0.44 - 1.00 mg/dL   Calcium 8.9 8.9 - 95.6 mg/dL   Total Protein 6.1 (L) 6.5 - 8.1 g/dL   Albumin 3.2 (L) 3.5 - 5.0 g/dL   AST 12 (L) 15 - 41 U/L   ALT 8 0 - 44 U/L   Alkaline Phosphatase 60 38 - 126 U/L   Total Bilirubin 0.5 <1.2 mg/dL   GFR, Estimated 49 (L) >60 mL/min   Anion gap 9 5 - 15  Troponin I (High Sensitivity)     Status: None   Collection Time: 01/12/23 12:40 PM  Result Value Ref Range   Troponin I (High Sensitivity) 5 <18 ng/L  Brain natriuretic peptide     Status: None   Collection Time: 01/12/23 12:40 PM  Result Value Ref Range   B Natriuretic Peptide 36.3 0.0 - 100.0 pg/mL  D-dimer, quantitative     Status: Abnormal   Collection Time: 01/12/23 12:40 PM  Result Value Ref Range  D-Dimer, Quant 1.70 (H) 0.00 - 0.50 ug/mL-FEU  Basic metabolic panel     Status: Abnormal   Collection Time: 01/13/23  3:41 AM  Result Value  Ref Range   Sodium 135 135 - 145 mmol/L   Potassium 3.7 3.5 - 5.1 mmol/L   Chloride 105 98 - 111 mmol/L   CO2 21 (L) 22 - 32 mmol/L   Glucose, Bld 105 (H) 70 - 99 mg/dL   BUN 7 6 - 20 mg/dL   Creatinine, Ser 0.45 (H) 0.44 - 1.00 mg/dL   Calcium 8.8 (L) 8.9 - 10.3 mg/dL   GFR, Estimated 52 (L) >60 mL/min   Anion gap 9 5 - 15  CBC     Status: Abnormal   Collection Time: 01/13/23  3:41 AM  Result Value Ref Range   WBC 8.0 4.0 - 10.5 K/uL   RBC 3.77 (L) 3.87 - 5.11 MIL/uL   Hemoglobin 10.1 (L) 12.0 - 15.0 g/dL   HCT 40.9 (L) 81.1 - 91.4 %   MCV 83.8 80.0 - 100.0 fL   MCH 26.8 26.0 - 34.0 pg   MCHC 32.0 30.0 - 36.0 g/dL   RDW 78.2 (H) 95.6 - 21.3 %   Platelets 330 150 - 400 K/uL   nRBC 0.0 0.0 - 0.2 %  Urinalysis, Routine w reflex microscopic -Urine, Clean Catch     Status: Abnormal   Collection Time: 01/13/23  4:01 AM  Result Value Ref Range   Color, Urine YELLOW YELLOW   APPearance CLEAR CLEAR   Specific Gravity, Urine 1.038 (H) 1.005 - 1.030   pH 5.0 5.0 - 8.0   Glucose, UA NEGATIVE NEGATIVE mg/dL   Hgb urine dipstick NEGATIVE NEGATIVE   Bilirubin Urine NEGATIVE NEGATIVE   Ketones, ur NEGATIVE NEGATIVE mg/dL   Protein, ur NEGATIVE NEGATIVE mg/dL   Nitrite NEGATIVE NEGATIVE   Leukocytes,Ua NEGATIVE NEGATIVE   *Note: Due to a large number of results and/or encounters for the requested time period, some results have not been displayed. A complete set of results can be found in Results Review.     X-ray: CLINICAL DATA:  Fall.  Left foot pain.   EXAM: LEFT FOOT - COMPLETE 3+ VIEW   COMPARISON:  Left foot radiographs 08/26/2014   FINDINGS: Mild hallux valgus is similar to prior. Minimal medial greater than lateral great toe metatarsophalangeal degenerative spurring.   There are new linear lucencies indicating acute fractures of the second through fifth metatarsal necks and portions of the metatarsal heads. There is up to 1-2 mm cortical step-off at the lateral  aspect of each metatarsal neck.   Mild distal medial talar neck degenerative spurring. Mild dorsal talar neck degenerative osteophytosis with mild adjacent posterior dorsal navicular degenerative osteophytosis.   Small plantar and posterior calcaneal heel spurs. Mild pes planus.   IMPRESSION: 1. Acute fractures of the second through fifth metatarsal necks and portions of the metatarsal heads. 2. Mild hallux valgus. 3. Mild pes planus.     Electronically Signed   By: Neita Garnet M.D.  ROS: As noted in her HPI Past Medical History Acid reflux Anemia CHF Nephrotic syndrome Panic attacks Depression/with anxiety Hidradenitis suppurativa Hypertension Migration  Blood pressure 126/78, pulse 94, temperature 98.7 F (37.1 C), temperature source Oral, resp. rate 17, height 5\' 11"  (1.803 m), weight (!) 142 kg, SpO2 96%.  Physical Exam: Constitutional: Obese, not in acute distress. Cardio:Regular rate and rhythm. No murmurs, rubs, or gallops. 2+ bilateral dorsalis pedis pulses. Pulm:Clear to auscultation  bilaterally. Normal work of breathing on room air. Abdomen: Soft, non-tender, non-distended, positive bowel sounds. UEA:[id number] for extremity edema.  Dorsum of left foot tender to palpation.  Mild tenderness to medial left thigh. No gross deformity of left forefoot Neuro: Alert and oriented x3 no focal weakness. Skin:Warm and dry.  Assessment/Plan: Closed left 2-5th metatarsal neck fractures  Plan: Cam walker boot to cover toes WBAT LLE RTC in 2 weeks to follow up with Odis Hollingshead Raechel Chute) Appropriate pain control   Shelda Pal 01/13/2023, 6:53 AM

## 2023-01-13 NOTE — Evaluation (Addendum)
Occupational Therapy Evaluation Patient Details Name: Lindsay [name] MRN: [id number] DOB: 08/01/1970 Today's Date: 01/13/2023   History of Present Illness Lindsay [name] is a 52 y.o. female who presented to the ED after a near syncopal episode and fall. Found to have L 2nd-5th metatarsal neck fxs. PMH of HFrEF last EF 45%, chronic respiratory failure on 2 L of oxygen at baseline, COPD   Clinical Impression   Lindsay [name] was evaluated s/p the above admission list. She lives with family and has a PCA who assist with ADL/IADLs at baseline, she denies other falls and reports intermittent SPC use in the home. Upon evaluation the pt was limited by bilat knee pain, L foot pain, L foot swelling, generalized weakness and limited activity tolerance. Overall she needed up to CGA for transfers and short mobility with heavy use of the RW. Due to the deficits listed below the pt also needs up to min A for LB ADLs and total A to don L CAM boot. Pt will benefit from continued acute OT services and HHOT.  Orthostatic BP taken and found to be negative.         If plan is discharge home, recommend the following: A little help with walking and/or transfers;A little help with bathing/dressing/bathroom;Assistance with cooking/housework;Assist for transportation;Help with stairs or ramp for entrance    Functional Status Assessment  Patient has had a recent decline in their functional status and demonstrates the ability to make significant improvements in function in a reasonable and predictable amount of time.  Equipment Recommendations  Tub/shower seat;Other (comment) (RW)       Precautions / Restrictions Precautions Precautions: Fall Restrictions Weight Bearing Restrictions Per Provider Order: Yes LLE Weight Bearing Per Provider Order: Weight bearing as tolerated (with CAM donned)      Mobility Bed Mobility Overal bed mobility: Needs Assistance Bed Mobility: Supine to Sit, Sit to Supine     Supine to  sit: Supervision Sit to supine: Supervision        Transfers Overall transfer level: Needs assistance Equipment used: Rolling walker (2 wheels) Transfers: Sit to/from Stand Sit to Stand: Contact guard assist                  Balance Overall balance assessment: Needs assistance Sitting-balance support: Feet supported Sitting balance-Leahy Scale: Good     Standing balance support: Single extremity supported, During functional activity Standing balance-Leahy Scale: Fair Standing balance comment: statically                           ADL either performed or assessed with clinical judgement   ADL Overall ADL's : Needs assistance/impaired Eating/Feeding: Independent   Grooming: Set up;Sitting   Upper Body Bathing: Set up;Sitting   Lower Body Bathing: Minimal assistance;Sit to/from stand   Upper Body Dressing : Set up;Sitting   Lower Body Dressing: Minimal assistance;Sit to/from stand   Toilet Transfer: Contact guard assist;Ambulation;Rolling walker (2 wheels)   Toileting- Clothing Manipulation and Hygiene: Supervision/safety;Sitting/lateral lean       Functional mobility during ADLs: Contact guard assist;Rolling walker (2 wheels) General ADL Comments: limited by L foot pain with WBing     Vision Baseline Vision/History: 0 No visual deficits Vision Assessment?: No apparent visual deficits     Perception Perception: Within Functional Limits       Praxis Praxis: WFL       Pertinent Vitals/Pain Pain Assessment Pain Assessment: Faces Faces Pain Scale: Hurts whole lot Pain Location: bilat  knees and L foot Pain Descriptors / Indicators: Discomfort Pain Intervention(s): Limited activity within patient's tolerance, Monitored during session     Extremity/Trunk Assessment Upper Extremity Assessment Upper Extremity Assessment: Overall WFL for tasks assessed   Lower Extremity Assessment Lower Extremity Assessment: Defer to PT evaluation    Cervical / Trunk Assessment Cervical / Trunk Assessment: Other exceptions Cervical / Trunk Exceptions: increased body habitus   Communication Communication Communication: No apparent difficulties   Cognition Arousal: Alert Behavior During Therapy: WFL for tasks assessed/performed Overall Cognitive Status: Within Functional Limits for tasks assessed           General Comments: WFL for basic orientation and command following     General Comments  VSS; BP stable throughout position changes            Home Living Family/patient expects to be discharged to:: Private residence Living Arrangements: Children;Parent Available Help at Discharge: Family;Available 24 hours/day;Personal care attendant (PCA 7 days/wk 3 hr/day) Type of Home: House Home Access: Level entry     Home Layout: One level     Bathroom Shower/Tub: Chief Strategy Officer: Handicapped height Bathroom Accessibility: No   Home Equipment: Rollator (4 wheels);Cane - single point   Additional Comments: per pt, rollator is broken      Prior Functioning/Environment Prior Level of Function : Independent/Modified Independent             Mobility Comments: intermittent use of SPC since rollator has been broken ADLs Comments: PCA assists with bathing, dressing. Family assists with IADLs        OT Problem List: Decreased strength;Decreased range of motion;Decreased activity tolerance;Impaired balance (sitting and/or standing);Decreased safety awareness;Pain      OT Treatment/Interventions: Self-care/ADL training;Therapeutic exercise;DME and/or AE instruction;Therapeutic activities;Patient/family education;Balance training    OT Goals(Current goals can be found in the care plan section) Acute Rehab OT Goals Patient Stated Goal: less pain OT Goal Formulation: With patient Time For Goal Achievement: 01/13/23 Potential to Achieve Goals: Good ADL Goals Pt Will Perform Lower Body Dressing:  with modified independence;sit to/from stand Pt Will Transfer to Toilet: with modified independence;ambulating Additional ADL Goal #1: Pt will tolerate at least 8 minutes of OOB activity to demonstrate improved endurance for ADLs at discharge  OT Frequency: Min 1X/week       AM-PAC OT "6 Clicks" Daily Activity     Outcome Measure Help from another person eating meals?: None Help from another person taking care of personal grooming?: A Little Help from another person toileting, which includes using toliet, bedpan, or urinal?: A Little Help from another person bathing (including washing, rinsing, drying)?: A Little Help from another person to put on and taking off regular upper body clothing?: A Little Help from another person to put on and taking off regular lower body clothing?: A Little 6 Click Score: 19   End of Session Equipment Utilized During Treatment: Rolling walker (2 wheels) (CAM boot) Nurse Communication: Mobility status  Activity Tolerance: Patient tolerated treatment well Patient left: in bed;with call bell/phone within reach;with bed alarm set  OT Visit Diagnosis: Unsteadiness on feet (R26.81);Other abnormalities of gait and mobility (R26.89);Muscle weakness (generalized) (M62.81);Pain                Time: 1308-6578 OT Time Calculation (min): 19 min Charges:  OT General Charges $OT Visit: 1 Visit OT Evaluation $OT Eval Moderate Complexity: 1 Mod  Derenda Mis, OTR/L Acute Rehabilitation Services Office [phone] Secure Chat Communication Preferred   Lindsay Mast  D [name] 01/13/2023, 12:46 PM

## 2023-01-13 NOTE — Plan of Care (Signed)
  Problem: Education: Goal: Knowledge of General Education information will improve Description: Including pain rating scale, medication(s)/side effects and non-pharmacologic comfort measures Outcome: Progressing   Problem: Pain Management: Goal: General experience of comfort will improve Outcome: Progressing

## 2023-01-13 NOTE — Care Management Obs Status (Signed)
MEDICARE OBSERVATION STATUS NOTIFICATION   Patient Details  Name: Lindsay [name] MRN: [id number] Date of Birth: April 01, 1970   Medicare Observation Status Notification Given:  Yes    Ronny Bacon, RN 01/13/2023, 9:24 AM

## 2023-01-13 NOTE — Evaluation (Signed)
Physical Therapy Evaluation Patient Details Name: Lindsay [name] MRN: [id number] DOB: 06-08-1970 Today's Date: 01/13/2023  History of Present Illness  Lindsay [name] is a 52 y.o. female who presented to the ED after a near syncopal episode and fall. Found to have L 2nd-5th metatarsal neck fxs. PMH of HFrEF last EF 45%, chronic respiratory failure on 2 L of oxygen at baseline, COPD  Clinical Impression  PTA, pt lives with her family and has a PCA who assists with ADL's/IADL's at baseline. Pt uses a cane for mobility. Orthostatics assessed by OT prior to session and were negative. No nystagmus noted with smooth pursuits. Pt ambulating room distances with a walker with CAM boot donned. Limited due to left foot pain. Encouraged elevation and ice (applied post session). Will benefit from HHPT at d/c.      If plan is discharge home, recommend the following: A little help with bathing/dressing/bathroom;Assistance with cooking/housework;Assist for transportation;Help with stairs or ramp for entrance   Can travel by private vehicle        Equipment Recommendations Rolling walker (2 wheels)  Recommendations for Other Services       Functional Status Assessment Patient has had a recent decline in their functional status and demonstrates the ability to make significant improvements in function in a reasonable and predictable amount of time.     Precautions / Restrictions Precautions Precautions: Fall Restrictions Weight Bearing Restrictions Per Provider Order: Yes LLE Weight Bearing Per Provider Order: Weight bearing as tolerated (with CAM donned)      Mobility  Bed Mobility Overal bed mobility: Needs Assistance Bed Mobility: Supine to Sit, Sit to Supine     Supine to sit: Supervision Sit to supine: Supervision        Transfers Overall transfer level: Needs assistance Equipment used: Rolling walker (2 wheels) Transfers: Sit to/from Stand Sit to Stand: Contact guard assist                 Ambulation/Gait Ambulation/Gait assistance: Contact guard assist Gait Distance (Feet): 30 Feet Assistive device: Rolling walker (2 wheels) Gait Pattern/deviations: Step-to pattern, Decreased stance time - left, Decreased weight shift to right, Antalgic Gait velocity: decreased Gait velocity interpretation: <1.31 ft/sec, indicative of household ambulator   General Gait Details: Antalgic gait pattern, cueing for walker use and sequencing  Stairs            Wheelchair Mobility     Tilt Bed    Modified Rankin (Stroke Patients Only)       Balance Overall balance assessment: Needs assistance Sitting-balance support: Feet supported Sitting balance-Leahy Scale: Good     Standing balance support: Single extremity supported, During functional activity Standing balance-Leahy Scale: Fair Standing balance comment: statically                             Pertinent Vitals/Pain Pain Assessment Pain Assessment: Faces Faces Pain Scale: Hurts whole lot Pain Location: bilat knees and L foot Pain Descriptors / Indicators: Discomfort Pain Intervention(s): Monitored during session, Premedicated before session, Ice applied    Home Living Family/patient expects to be discharged to:: Private residence Living Arrangements: Children;Parent Available Help at Discharge: Family;Available 24 hours/day;Personal care attendant (PCA 7 days/wk 3 hr/day) Type of Home: House Home Access: Level entry       Home Layout: One level Home Equipment: Rollator (4 wheels);Cane - single point Additional Comments: per pt, rollator is broken    Prior Function Prior Level of Function :  Independent/Modified Independent             Mobility Comments: intermittent use of SPC since rollator has been broken ADLs Comments: PCA assists with bathing, dressing. Family assists with IADLs     Extremity/Trunk Assessment   Upper Extremity Assessment Upper Extremity Assessment:  Overall WFL for tasks assessed    Lower Extremity Assessment Lower Extremity Assessment: Generalized weakness    Cervical / Trunk Assessment Cervical / Trunk Assessment: Other exceptions Cervical / Trunk Exceptions: increased body habitus  Communication   Communication Communication: No apparent difficulties  Cognition Arousal: Alert Behavior During Therapy: WFL for tasks assessed/performed Overall Cognitive Status: Within Functional Limits for tasks assessed                                 General Comments: WFL for basic orientation and command following        General Comments General comments (skin integrity, edema, etc.): VSS; BP stable throughout position changes    Exercises     Assessment/Plan    PT Assessment Patient needs continued PT services  PT Problem List Decreased strength;Decreased activity tolerance;Decreased balance;Decreased mobility;Pain       PT Treatment Interventions DME instruction;Gait training;Functional mobility training;Therapeutic activities;Therapeutic exercise;Balance training;Patient/family education    PT Goals (Current goals can be found in the Care Plan section)  Acute Rehab PT Goals Patient Stated Goal: agreeable to HHPT PT Goal Formulation: With patient Time For Goal Achievement: 01/27/23 Potential to Achieve Goals: Good    Frequency Min 1X/week     Co-evaluation               AM-PAC PT "6 Clicks" Mobility  Outcome Measure Help needed turning from your back to your side while in a flat bed without using bedrails?: None Help needed moving from lying on your back to sitting on the side of a flat bed without using bedrails?: A Little Help needed moving to and from a bed to a chair (including a wheelchair)?: A Little Help needed standing up from a chair using your arms (e.g., wheelchair or bedside chair)?: A Little Help needed to walk in hospital room?: A Little Help needed climbing 3-5 steps with a railing?  : A Lot 6 Click Score: 18    End of Session Equipment Utilized During Treatment: Other (comment) (CAM boot) Activity Tolerance: Patient tolerated treatment well Patient left: in bed;with call bell/phone within reach Nurse Communication: Mobility status PT Visit Diagnosis: Unsteadiness on feet (R26.81);Other abnormalities of gait and mobility (R26.89);History of falling (Z91.81)    Time: 6045-4098 PT Time Calculation (min) (ACUTE ONLY): 23 min   Charges:   PT Evaluation $PT Eval Low Complexity: 1 Low PT Treatments $Gait Training: 8-22 mins PT General Charges $$ ACUTE PT VISIT: 1 Visit         Lillia Pauls, PT, DPT Acute Rehabilitation Services Office [phone]   Norval Morton 01/13/2023, 1:23 PM

## 2023-01-13 NOTE — Progress Notes (Signed)
Transition of Care [facility]) - CAGE-AID Screening   Patient Details  Name: Lindsay [name] MRN: [id number] Date of Birth: 07/04/1970  Hewitt Shorts, RN Trauma Response Nurse Phone Number: 01/13/2023, 7:39 PM      CAGE-AID Screening:    Have You Ever Felt You Ought to Cut Down on Your Drinking or Drug Use?: No Have People Annoyed You By [employer] Your Drinking Or Drug Use?: No Have You Felt Bad Or Guilty About Your Drinking Or Drug Use?: No Have You Ever Had a Drink or Used Drugs First Thing In The Morning to Steady Your Nerves or to Get Rid of a Hangover?: No CAGE-AID Score: 0  Substance Abuse Education Offered: No (no services needed)

## 2023-01-14 ENCOUNTER — Observation Stay (HOSPITAL_BASED_OUTPATIENT_CLINIC_OR_DEPARTMENT_OTHER): Payer: 59

## 2023-01-14 DIAGNOSIS — S92302A Fracture of unspecified metatarsal bone(s), left foot, initial encounter for closed fracture: Secondary | ICD-10-CM | POA: Diagnosis not present

## 2023-01-14 DIAGNOSIS — I272 Pulmonary hypertension, unspecified: Secondary | ICD-10-CM | POA: Diagnosis not present

## 2023-01-14 DIAGNOSIS — R55 Syncope and collapse: Secondary | ICD-10-CM | POA: Diagnosis not present

## 2023-01-14 DIAGNOSIS — I959 Hypotension, unspecified: Secondary | ICD-10-CM

## 2023-01-14 LAB — BASIC METABOLIC PANEL
Anion gap: 9 (ref 5–15)
BUN: 7 mg/dL (ref 6–20)
CO2: 22 mmol/L (ref 22–32)
Calcium: 8.6 mg/dL — ABNORMAL LOW (ref 8.9–10.3)
Chloride: 106 mmol/L (ref 98–111)
Creatinine, Ser: 0.96 mg/dL (ref 0.44–1.00)
GFR, Estimated: >60 mL/min (ref >60)
Glucose, Bld: 112 mg/dL — ABNORMAL HIGH (ref 70–99)
Potassium: 3.7 mmol/L (ref 3.5–5.1)
Sodium: 137 mmol/L (ref 135–145)

## 2023-01-14 LAB — ECHOCARDIOGRAM COMPLETE
Area-P 1/2: 8.07 cm2
Calc EF: 48.4 %
Est EF: 50
Height: 71 in
S' Lateral: 4.2 cm
Single Plane A2C EF: 49.2 %
Single Plane A4C EF: 48.9 %
Weight: 4941.83 [oz_av]

## 2023-01-14 LAB — TSH: TSH: 0.422 u[IU]/mL (ref 0.350–4.500)

## 2023-01-14 MED ORDER — ENTRESTO 24-26 MG PO TABS: 1.0000 | ORAL_TABLET | Freq: Two times a day (BID) | ORAL | 0 refills | Status: DC

## 2023-01-14 MED ORDER — TORSEMIDE 20 MG PO TABS: 20.0000 mg | ORAL_TABLET | Freq: Two times a day (BID) | ORAL | 0 refills | Status: DC

## 2023-01-14 MED ORDER — OXYCODONE HCL 5 MG PO TABS: 5.0000 mg | ORAL_TABLET | ORAL | 0 refills | Status: DC

## 2023-01-14 NOTE — Progress Notes (Signed)
This morning this RN witnessed this patient going to the bathroom.  Patient was not very stable on her feet and is listed as a high fall risk.  This nurse explained to the patient that she needs to call hospital staff to go to the bathroom for safety.  Patient understood and was compliant with these rules.  Patient apparently called the nurses station to request assistance with the bathroom.  This nurse was not aware of this.  Patient got angry for the wait and started yelling.  Threatened to "report all the staff to the president of the hospital."  Patient requesting immediate discharge.  MD aware.  Patient is calm and comfortable now.

## 2023-01-14 NOTE — Progress Notes (Signed)
  Echocardiogram 2D Echocardiogram has been performed.  Leda Roys RDCS 01/14/2023, 10:28 AM

## 2023-01-14 NOTE — Plan of Care (Signed)

## 2023-01-14 NOTE — Progress Notes (Signed)
    Durable Medical Equipment  (From admission, onward)           Start     Ordered   01/14/23 1133  DME Shower stool  Once        01/14/23 1133   01/14/23 1133  DME Walker  Once       Question Answer Comment  Walker: Other   Comments Rolling walker with 2 wheels   Patient needs a walker to treat with the following condition Weakness      01/14/23 1133

## 2023-01-14 NOTE — TOC Transition Note (Signed)
Transition of Care [facility]) - Discharge Note   Patient Details  Name: Lindsay [name] MRN: [id number] Date of Birth: 10-15-1970  Transition of Care [facility]) CM/SW Contact:  Epifanio Lesches, RN Phone Number: 01/14/2023, 12:38 PM   Clinical Narrative:    Pt anxiously ready to leave hospital . Didn't wait for ordered DME. Referral made with [employer] for RW and shower seat. Equipment will be drop shipped to pt's home. Pt agreeable to home health services. Referral made with [facility] and accepted. Post hospital f/u noted on AVS.. Benedetto Goad to provide transportation to home..  Patient Goals and CMS Choice     Choice offered to / list presented to : Patient      Discharge Placement                       Discharge Plan and Services Additional resources added to the After Visit Summary for                  DME Arranged: Walker rolling, Other see comment (shower seat)   Date DME Agency Contacted: 01/14/23 Time DME Agency Contacted: 1233 Representative spoke with at DME Agency: Ian Malkin HH Arranged: PT, OT HH Agency: [facility] Date [facility] Agency Contacted: 01/14/23 Time HH Agency Contacted: 1238 Representative spoke with at [facility] Agency: Tresa Endo  Social Drivers of Health (SDOH) Interventions SDOH Screenings   Food Insecurity: Food Insecurity Present (01/13/2023)  Housing: High Risk (01/13/2023)  Transportation Needs: Unmet Transportation Needs (01/13/2023)  Utilities: At Risk (01/13/2023)  Alcohol Screen: Low Risk  (10/02/2022)  Depression (PHQ2-9): Low Risk  (10/02/2022)  Recent Concern: Depression (PHQ2-9) - High Risk (09/25/2022)  Financial Resource Strain: High Risk (10/02/2022)  Physical Activity: Sufficiently Active (10/02/2022)  Social Connections: Moderately Isolated (10/02/2022)  Stress: Stress Concern Present (10/02/2022)  Tobacco Use: Medium Risk (12/24/2022)  Health Literacy: Adequate Health Literacy (10/02/2022)     Readmission Risk Interventions     No data  to display

## 2023-01-14 NOTE — Discharge Summary (Signed)
Name: Lindsay [name] MRN: [id number] DOB: 1970-11-26 52 y.o. PCP: Lindsay Poll, MD  Date of Admission: 01/12/2023 12:33 PM Date of Discharge:  01/14/23 Attending Physician: Dr. Ninetta Lights  DISCHARGE DIAGNOSIS:  Primary Problem: Near syncope   Hospital Problems: Principal Problem:   Near syncope Active Problems:   Metatarsal fracture, pathologic, left, initial encounter    DISCHARGE MEDICATIONS:   Allergies as of 01/14/2023       Reactions   Lisinopril Rash, Cough        Medication List     STOP taking these medications    Entresto 49-51 MG Generic drug: sacubitril-valsartan Replaced by: Sherryll Burger 24-26 MG   meclizine 25 MG tablet Commonly known as: ANTIVERT       TAKE these medications    Accu-Chek Guide Me w/Device Kit Dispense one device   Accu-Chek Guide test strip Generic drug: glucose blood USE ONE TEST STRIP TO CHECK BLOOD SUGARS ONCE A DAY AS NEEDED   Accu-Chek Softclix Lancets lancets USE ONE LANCET TO CHECK BLOOD SUGAR ONE A DAY AS NEEDED   acetaminophen 500 MG tablet Commonly known as: TYLENOL Take 1 tablet (500 mg total) by mouth every 6 (six) hours as needed.   albuterol 108 (90 Base) MCG/ACT inhaler Commonly known as: VENTOLIN HFA TAKE 2 PUFFS BY MOUTH EVERY 6 HOURS AS NEEDED FOR WHEEZE OR SHORTNESS OF BREATH   albuterol (2.5 MG/3ML) 0.083% nebulizer solution Commonly known as: PROVENTIL Take 3 mLs (2.5 mg total) by nebulization every 6 (six) hours as needed for wheezing or shortness of breath.   atorvastatin 40 MG tablet Commonly known as: LIPITOR TAKE 1 TABLET BY MOUTH EVERY DAY   azelastine 0.1 % nasal spray Commonly known as: ASTELIN Place 1 spray into both nostrils 2 (two) times daily. Use in each nostril as directed   cetirizine 10 MG tablet Commonly known as: ZYRTEC Take 1 tablet (10 mg total) by mouth daily.   dicyclomine 10 MG capsule Commonly known as: BENTYL Take 1 capsule (10 mg total) by mouth 3 (three)  times daily as needed for spasms.   doxepin 25 MG capsule Commonly known as: SINEQUAN Take 1 capsule (25 mg total) by mouth at bedtime.   Entresto 24-26 MG Generic drug: sacubitril-valsartan Take 1 tablet by mouth 2 (two) times daily. Replaces: Entresto 49-51 MG   fluticasone 50 MCG/ACT nasal spray Commonly known as: FLONASE SPRAY 1 SPRAY INTO EACH NOSTRIL DAILY   ipratropium 0.06 % nasal spray Commonly known as: ATROVENT PLEASE SEE ATTACHED FOR DETAILED DIRECTIONS   lamoTRIgine 100 MG tablet Commonly known as: LaMICtal Take 1 tablet (100 mg total) by mouth daily.   linaclotide 290 MCG Caps capsule Commonly known as: LINZESS Take 1 capsule (290 mcg total) by mouth daily before breakfast.   megestrol 40 MG tablet Commonly known as: MEGACE Take 2 tablets (80 mg total) by mouth daily.   metoprolol succinate 50 MG 24 hr tablet Commonly known as: TOPROL-XL Take 1 tablet (50 mg total) by mouth daily.   omeprazole 40 MG capsule Commonly known as: PRILOSEC Take 1 capsule (40 mg total) by mouth 2 (two) times daily.   ondansetron 8 MG disintegrating tablet Commonly known as: ZOFRAN-ODT Take 1 tablet (8 mg total) by mouth every 8 (eight) hours as needed.   oxyCODONE 5 MG immediate release tablet Commonly known as: Oxy IR/ROXICODONE Take 1 tablet (5 mg total) by mouth every 4 (four) hours.   Ozempic (2 MG/DOSE) 8 MG/3ML Sopn Generic drug: Semaglutide (2  MG/DOSE) Inject 2 mg into the skin once a week.   promethazine 25 MG tablet Commonly known as: PHENERGAN TAKE 1 TABLET BY MOUTH EVERY 6 HOURS AS NEEDED FOR NAUSEA OR VOMITING.   Rexulti 3 MG Tabs Generic drug: Brexpiprazole Take 1 tablet (3 mg total) by mouth daily.   SUMAtriptan 50 MG tablet Commonly known as: Imitrex Take 1 tablet (50 mg total) by mouth every 2 (two) hours as needed for up to 10 doses for migraine. May repeat in 2 hours if headache persists or recurs.   tiZANidine 4 MG tablet Commonly known as:  Zanaflex Take 1 tablet (4 mg total) by mouth every 8 (eight) hours as needed for muscle spasms.   torsemide 20 MG tablet Commonly known as: DEMADEX Take 1 tablet (20 mg total) by mouth 2 (two) times daily. What changed: how much to take   traMADol 50 MG tablet Commonly known as: Ultram Take 1 tablet (50 mg total) by mouth every 6 (six) hours as needed.               Durable Medical Equipment  (From admission, onward)           Start     Ordered   01/14/23 1133  DME Shower stool  Once        01/14/23 1133   01/14/23 1133  DME Walker  Once       Question Answer Comment  Walker: Other   Comments Rolling walker with 2 wheels   Patient needs a walker to treat with the following condition Weakness      01/14/23 1133            DISPOSITION AND FOLLOW-UP:  [name] was discharged from [facility] in [place] condition. At the hospital follow up visit please address:  Follow-up Recommendations: Consults: Ortho Labs:  T4 and free T3 (pending at discharge) , CBC, BMP Studies: None Medications: Meclizine, torsemide, Entresto   Follow-up Appointments:  Follow-up Information     Lindsay Cedars, MD Follow up in 2 week(s).   Specialty: Orthopedic Surgery Why: Call for appointment to have follow up Xrays Contact information: [address], [address] [place] [address] [phone]         Lindsay Poll, MD Follow up.   Specialty: Internal Medicine Contact information: [address] [place] [address] [phone]                 HOSPITAL COURSE:  Patient Summary: #Near Syncope Presented to the ED after near syncopal episode at home.  On presentation to the ED, hypotensive at 87/59.  Head CT and chest x-ray unremarkable.  Noted to have a history of falls with most recent in 08/2022.  Etiology likely multifactorial in context of hypovolemia and deconditioning.  CTA noted enlargement of main pulmonary artery  suggestive of pulmonary hypertension so echo was ordered but final read pending at discharge.  Meclizine held.  Recommended to have home health PT and OT.  #Acute Left Toe Fracture  Found to have fracture through fifth metatarsal necks.  Ortho consulted and recommended no surgical intervention at follow-up in 2 weeks.  Also recommended to have a cam walker boot to cover toes and WBAT LLE.   #HFrEF Last echo 10/21 showed LVEF of 45-50% w/ left ventricular hypertrophy. Remains euvolemic. Home regiment includes metoprolol, torsemide, Entresto. Repeat echo demonstrated LEVEF of 50% with global hypokinesis of the LV. Torsemide changed to 20 mg PRN due to persistently  low blood pressures so we will follow-up outpatient regarding initiating medication again.  Entresto dosage halved or low blood pressures and patient continued on TOPROL-XL.  Follow-up outpatient regarding dosages changes of his medications.  #Chronic Normocytic Anemia Hemoglobin stable throughout stay around 10, which appears to be at baseline.  Denied any symptoms and recommend follow-up CBC outpatient.   DISCHARGE INSTRUCTIONS:   Discharge Instructions     (HEART FAILURE PATIENTS) Call MD:  Anytime you have any of the following symptoms: 1) 3 pound weight gain in 24 hours or 5 pounds in 1 week 2) shortness of breath, with or without a dry hacking cough 3) swelling in the hands, feet or stomach 4) if you have to sleep on extra pillows at night in order to breathe.   Complete by: As directed    Call MD for:  difficulty breathing, headache or visual disturbances   Complete by: As directed    Call MD for:  extreme fatigue   Complete by: As directed    Call MD for:  hives   Complete by: As directed    Call MD for:  persistant dizziness or light-headedness   Complete by: As directed    Call MD for:  persistant nausea and vomiting   Complete by: As directed    Call MD for:  redness, tenderness, or signs of infection (pain, swelling,  redness, odor or green/yellow discharge around incision site)   Complete by: As directed    Call MD for:  severe uncontrolled pain   Complete by: As directed    Call MD for:  temperature >100.4   Complete by: As directed    Diet - low sodium heart healthy   Complete by: As directed    Discharge instructions   Complete by: As directed    Ms. Azzara,  You were hospitalized for a fall and foot fracture. This was likely caused by dehydration and decreased strength. For your foot fracture, you were seen by ortho who did not recommend surgery. For your fall, we stopped medications that may have been contributing. We also did a repeat ultrasound of your heart, and we will follow-up with your results at your outpatient visit.   Please *STOP* taking the following medications:  -Meclizine 25 mg TID   Please *CONTINUE* taking the following medications:  -Entresto 24-26 mg two times per day -Torsemide 20 mg as needed  Please continue taking your other medications as directed. You are scheduled for a follow-up with Dr. Antony Contras who may resume these medications at your next visit depending upon how you are doing. If you have any questions, please call the clinic at [phone]. We are glad that you are feeling better!   Increase activity slowly   Complete by: As directed        SUBJECTIVE:  Patient was evaluated at bedside. No concerns overnight. Denies any chest pain, troubles breathing, SOB, or any other signs or symptoms.  Discharge Vitals:   BP 108/80 (BP Location: Right Arm)   Pulse (!) 106   Temp 98.6 F (37 C)   Resp 17   Ht 5\' 11"  (1.803 m)   Wt (!) 140.1 kg   SpO2 99%   BMI 43.08 kg/m   OBJECTIVE:  Physical Exam Constitutional:      Appearance: Normal appearance.  Cardiovascular:     Rate and Rhythm: Normal rate and regular rhythm.     Pulses: Normal pulses.     Heart sounds: Normal heart sounds.  Pulmonary:  Effort: Pulmonary effort is normal.     Breath sounds:  Normal breath sounds.  Abdominal:     General: Abdomen is flat. Bowel sounds are normal.     Palpations: Abdomen is soft.  Musculoskeletal:     Comments: Dorsum of left foot tender to palpation.  Mild tenderness to medial left thigh. No gross deformity of left forefoot  Neurological:     Mental Status: She is alert.     Pertinent Labs, Studies, and Procedures:     Latest Ref Rng & Units 01/13/2023    3:41 AM 01/12/2023   12:40 PM 12/03/2022   10:14 AM  CBC  WBC 4.0 - 10.5 K/uL 8.0  6.8  9.3   Hemoglobin 12.0 - 15.0 g/dL 16.1  09.6  04.5   Hematocrit 36.0 - 46.0 % 31.6  33.3  36.1   Platelets 150 - 400 K/uL 330  368  408.0        Latest Ref Rng & Units 01/14/2023    5:49 AM 01/13/2023    3:41 AM 01/12/2023   12:40 PM  CMP  Glucose 70 - 99 mg/dL 409  811  96   BUN 6 - 20 mg/dL 7  7  9    Creatinine 0.44 - 1.00 mg/dL 9.14  7.82  9.56   Sodium 135 - 145 mmol/L 137  135  139   Potassium 3.5 - 5.1 mmol/L 3.7  3.7  3.9   Chloride 98 - 111 mmol/L 106  105  105   CO2 22 - 32 mmol/L 22  21  25    Calcium 8.9 - 10.3 mg/dL 8.6  8.8  8.9   Total Protein 6.5 - 8.1 g/dL   6.1   Total Bilirubin <1.2 mg/dL   0.5   Alkaline Phos 38 - 126 U/L   60   AST 15 - 41 U/L   12   ALT 0 - 44 U/L   8     CT Angio Chest PE W and/or Wo Contrast Result Date: 01/12/2023 CLINICAL DATA:  Pulmonary embolism (PE) suspected, high prob. Acute onset of dizziness precipitating a fall while walking. EXAM: CT ANGIOGRAPHY CHEST WITH CONTRAST TECHNIQUE: Multidetector CT imaging of the chest was performed using the standard protocol during bolus administration of intravenous contrast. Multiplanar CT image reconstructions and MIPs were obtained to evaluate the vascular anatomy. RADIATION DOSE REDUCTION: This exam was performed according to the departmental dose-optimization program which includes automated exposure control, adjustment of the mA and/or kV according to patient size and/or use of iterative reconstruction  technique. CONTRAST:  75mL OMNIPAQUE IOHEXOL 350 MG/ML SOLN COMPARISON:  CTA chest/abdomen/pelvis 05/16/2022. FINDINGS: Cardiovascular: Suboptimal timing of the contrast bolus with satisfactory opacification of the pulmonary arteries to the lobar level. No convincing evidence of pulmonary embolism. Enlarged main pulmonary artery, suggestive of pulmonary hypertension. Mediastinum/Nodes: No enlarged mediastinal, hilar, or axillary lymph nodes. Thyroid gland, trachea, and esophagus demonstrate no significant findings. Lungs/Pleura: Unchanged 5 mm subpleural nodular opacity in the lateral aspect of the superior left lower lobe segment (axial image 59 series 6). Patchy peripheral opacities in the basilar left lower lobe may be of infectious or inflammatory etiology. Subsegmental atelectasis in the lingula. No pleural effusion or pneumothorax. Upper Abdomen: No acute findings. Musculoskeletal: No chest wall abnormality. No acute or significant osseous findings. Review of the MIP images confirms the above findings. IMPRESSION: 1. Suboptimal timing of the contrast bolus with satisfactory opacification of the pulmonary arteries to the lobar level. No convincing  evidence of pulmonary embolism. 2. Patchy peripheral opacities in the basilar left lower lobe may be of infectious or inflammatory etiology. 3. Unchanged 5 mm subpleural nodular opacity in the lateral aspect of the superior left lower lobe segment. 4. Enlarged main pulmonary artery, suggestive of pulmonary hypertension. Electronically Signed   By: Orvan Falconer M.D.   On: 01/12/2023 15:10   CT HEAD WO CONTRAST ( ) Result Date: 01/12/2023 CLINICAL DATA:  Head trauma, moderate-severe. Fall with head strike. EXAM: CT HEAD WITHOUT CONTRAST TECHNIQUE: Contiguous axial images were obtained from the base of the skull through the vertex without intravenous contrast. RADIATION DOSE REDUCTION: This exam was performed according to the departmental dose-optimization  program which includes automated exposure control, adjustment of the mA and/or kV according to patient size and/or use of iterative reconstruction technique. COMPARISON:  CTA head/neck and MRI brain 05/16/2022. FINDINGS: Brain: No acute intracranial hemorrhage. Gray-white differentiation is preserved. No hydrocephalus or extra-axial collection. No mass effect or midline shift. Vascular: No hyperdense vessel or unexpected calcification. Skull: No calvarial fracture or suspicious bone lesion. Skull base is unremarkable. Sinuses/Orbits: No acute finding. Other: None. IMPRESSION: No acute intracranial abnormality. Electronically Signed   By: Orvan Falconer M.D.   On: 01/12/2023 15:00   DG Foot Complete Left Result Date: 01/12/2023 CLINICAL DATA:  Fall.  Left foot pain. EXAM: LEFT FOOT - COMPLETE 3+ VIEW COMPARISON:  Left foot radiographs 08/26/2014 FINDINGS: Mild hallux valgus is similar to prior. Minimal medial greater than lateral great toe metatarsophalangeal degenerative spurring. There are new linear lucencies indicating acute fractures of the second through fifth metatarsal necks and portions of the metatarsal heads. There is up to 1-2 mm cortical step-off at the lateral aspect of each metatarsal neck. Mild distal medial talar neck degenerative spurring. Mild dorsal talar neck degenerative osteophytosis with mild adjacent posterior dorsal navicular degenerative osteophytosis. Small plantar and posterior calcaneal heel spurs. Mild pes planus. IMPRESSION: 1. Acute fractures of the second through fifth metatarsal necks and portions of the metatarsal heads. 2. Mild hallux valgus. 3. Mild pes planus. Electronically Signed   By: Neita Garnet M.D.   On: 01/12/2023 14:35   DG Chest Port 1 View Result Date: 01/12/2023 CLINICAL DATA:  Near syncope EXAM: PORTABLE CHEST 1 VIEW COMPARISON:  05/16/2022 FINDINGS: The heart size and mediastinal contours are within normal limits. Hazy attenuation over the bilateral lung  bases is favored to be secondary to summation of overlying soft tissues. Mild bibasilar atelectasis or edema are not excluded. No appreciable pleural fluid collection. No pneumothorax. The visualized skeletal structures are unremarkable. IMPRESSION: Hazy attenuation over the bilateral lung bases is favored to be secondary to summation of overlying soft tissues. Mild bibasilar atelectasis or edema are not excluded. Electronically Signed   By: Duanne Guess D.O.   On: 01/12/2023 13:38    Signed: Morrie Sheldon, MD Internal Medicine Resident, PGY-1 [facility]  Pager: [phone]

## 2023-01-14 NOTE — Progress Notes (Addendum)
AVS reviewed with patient.  All questions answered.  Patient excited for DC   As patient was sitting into wheelchair to go down for her [employer] she stated "So what am I going to do about my walker?"  Angela CM advised and aware.  Patient refused to wait for home health services to be set up, or for DME to be delivered to the room.  Patient stated that they were upset and wanted to leave now.

## 2023-01-14 NOTE — Plan of Care (Signed)
  Problem: Education: Goal: Knowledge of General Education information will improve Description: Including pain rating scale, medication(s)/side effects and non-pharmacologic comfort measures Outcome: Progressing   Problem: Health Behavior/Discharge Planning: Goal: Ability to manage health-related needs will improve Outcome: Progressing   Problem: Activity: Goal: Risk for activity intolerance will decrease Outcome: Progressing   Problem: Pain Management: Goal: General experience of comfort will improve Outcome: Progressing

## 2023-01-15 ENCOUNTER — Telehealth: Payer: Self-pay | Admitting: *Deleted

## 2023-01-15 LAB — T3, FREE: T3, Free: 2.9 pg/mL (ref 2.0–4.4)

## 2023-01-15 LAB — T4: T4, Total: 6.2 ug/dL (ref 4.5–12.0)

## 2023-01-15 NOTE — Telephone Encounter (Signed)
Pt called / informed ok to take Oxycodone per Dr Antony Contras.

## 2023-01-15 NOTE — Telephone Encounter (Signed)
Yes that is fine thank you 

## 2023-01-15 NOTE — Telephone Encounter (Signed)
Call from pt stating she broke her foot on Sunday and was in the hospital. And she was given a rx for Oxycodone - she wants to be sure it's ok  with her PCP to take it.

## 2023-01-16 ENCOUNTER — Other Ambulatory Visit: Payer: Self-pay | Admitting: Internal Medicine

## 2023-01-17 ENCOUNTER — Other Ambulatory Visit: Payer: Self-pay | Admitting: Student

## 2023-01-17 DIAGNOSIS — R0982 Postnasal drip: Secondary | ICD-10-CM

## 2023-01-17 NOTE — Telephone Encounter (Signed)
 Next appt scheduled 02/12/23 with PCP.

## 2023-01-18 ENCOUNTER — Other Ambulatory Visit: Payer: Self-pay | Admitting: Student

## 2023-01-18 MED ORDER — OXYCODONE HCL 5 MG PO TABS: 5.0000 mg | ORAL_TABLET | Freq: Four times a day (QID) | ORAL | 0 refills | Status: DC | PRN

## 2023-01-18 NOTE — Progress Notes (Signed)
Received call on after hours line. Pt was recently discharged from our service where she was found to have an acute fracture of her metatarsals on her left foot. She has run out of her pain medication (has been using it appropriately), and is requesting refill. Given that I am familiar with this patient and have evaluated for this before, I will send in a short refill of this, and have patient follow up in our clinic.

## 2023-01-20 ENCOUNTER — Other Ambulatory Visit: Payer: Self-pay | Admitting: Internal Medicine

## 2023-01-20 ENCOUNTER — Other Ambulatory Visit: Payer: Self-pay | Admitting: *Deleted

## 2023-01-20 ENCOUNTER — Other Ambulatory Visit: Payer: Self-pay | Admitting: Student

## 2023-01-20 ENCOUNTER — Encounter: Payer: Self-pay | Admitting: Internal Medicine

## 2023-01-20 DIAGNOSIS — A084 Viral intestinal infection, unspecified: Secondary | ICD-10-CM

## 2023-01-20 MED ORDER — IBUPROFEN 200 MG PO TABS: 400.0000 mg | ORAL_TABLET | Freq: Three times a day (TID) | ORAL | 0 refills | Status: DC | PRN

## 2023-01-20 MED ORDER — DICYCLOMINE HCL 10 MG PO CAPS: 10.0000 mg | ORAL_CAPSULE | Freq: Three times a day (TID) | ORAL | 0 refills | Status: DC | PRN

## 2023-01-21 ENCOUNTER — Other Ambulatory Visit: Payer: Self-pay | Admitting: Student

## 2023-01-21 DIAGNOSIS — I5023 Acute on chronic systolic (congestive) heart failure: Secondary | ICD-10-CM

## 2023-01-23 ENCOUNTER — Telehealth: Payer: Self-pay | Admitting: Student

## 2023-01-23 MED ORDER — OXYCODONE HCL 5 MG PO TABS: 5.0000 mg | ORAL_TABLET | Freq: Four times a day (QID) | ORAL | 0 refills | Status: AC | PRN

## 2023-01-23 NOTE — Telephone Encounter (Signed)
Patient called the after-hours line with continued left foot pain due to multiple metatarsal fractures that were found during recent hospitalization.  Orthopedic surgery did evaluate and recommended nonsurgical management with a cam boot.  She has been using the boot but continues to have pain in her left foot.  This pain has not gotten worse but also has not gotten better.  There is no increased swelling or pain radiating up her leg.  She has been taking Tylenol 650 mg every 8 hours, ibuprofen 400 mg every 12 hours, and as needed oxycodone 5 mg every 6 hours but is about to run out.  We discussed elevation and icing her foot as well as increasing Tylenol to 1000 mg every 8 hours scheduled, ibuprofen 400 mg every 8 hours as needed staggered 4 hours between Tylenol, and I will send in a 3-day supply of oxycodone 5 mg every 6 hours as needed.  She will also call orthopedic surgery office to get follow-up as instructed on hospital discharge.

## 2023-01-25 ENCOUNTER — Other Ambulatory Visit: Payer: Self-pay | Admitting: Internal Medicine

## 2023-01-25 DIAGNOSIS — I5023 Acute on chronic systolic (congestive) heart failure: Secondary | ICD-10-CM

## 2023-01-27 ENCOUNTER — Telehealth: Payer: Self-pay | Admitting: *Deleted

## 2023-01-27 DIAGNOSIS — S92355D Nondisplaced fracture of fifth metatarsal bone, left foot, subsequent encounter for fracture with routine healing: Secondary | ICD-10-CM

## 2023-01-27 MED ORDER — ONDANSETRON 8 MG PO TBDP: 8.0000 mg | ORAL_TABLET | Freq: Three times a day (TID) | ORAL | 0 refills | Status: DC | PRN

## 2023-01-27 MED ORDER — OXYCODONE HCL 5 MG PO TABS: 5.0000 mg | ORAL_TABLET | Freq: Four times a day (QID) | ORAL | 0 refills | Status: DC | PRN

## 2023-01-27 NOTE — Telephone Encounter (Signed)
[facility] [place] for [facility] .  Patient was called and stated that she was told to stay off  her foot due to the break. Will need to see when doctors would like for her to start the PT.

## 2023-01-27 NOTE — Telephone Encounter (Signed)
Call from patient states is having a lot of pain in toes and foot after her fall. Patient said that medications she was told to take do not help. States has been unable to reach the Orthopaedic Surgeon for the follow up appointment .  Patient is also requesting a shower chair and Walker.  Call was made to Dr.  Dennis Bast office to set up the follow up appointment.  Patient initially said  that she was unable to reach the office.  Office to call patient to set up appointment for follow  up.  [name]

## 2023-01-27 NOTE — Telephone Encounter (Signed)
Placed order for DME

## 2023-01-27 NOTE — Telephone Encounter (Signed)
Will call and inform the patient.

## 2023-01-27 NOTE — Telephone Encounter (Signed)
Pharmacy requesting a 90 day supply

## 2023-01-27 NOTE — Telephone Encounter (Signed)
Call to patient to inform her that a prescription for the Oxycodone has been sent to the Pharmacy for her. Will need to discuss other ways to help with her pain instead of using the Opioids.  Patient agreed. Is awaiting call from Ortho for an appointment.

## 2023-01-27 NOTE — Telephone Encounter (Signed)
I have sent a 1 x dose of her oxycodone. Looks like we have already refilled this a few times. Please let her know this will be the last time we are filling this. For non surgical pain she should not require opioids for more than a few days. She might continue to have pain but we will have to figure out how to manage it without opioid medications. Thanks.

## 2023-01-27 NOTE — Telephone Encounter (Signed)
RTC to [place] message was left that patient is able to start her PT. Call to patient to inform her as well. Patient still requesting something for pain as well as the Walker and shower chair.  Patient will call the Orthopaedic office again to see if she has been scheduled as office person was forwarding message to the doctor to see if she can be scheduled earlier.

## 2023-01-27 NOTE — Telephone Encounter (Signed)
Ortho's consult note said "weight bearing as tolerated". Ok to start PT now with same instructions.

## 2023-01-31 NOTE — Progress Notes (Deleted)
 01/31/2023 Lindsay [name] [id number] December 10, 1970  Referring provider: Forest Coy, MD Primary GI doctor:Dr. Federico (Dr. Teressa)  ASSESSMENT AND PLAN:   Anemia New onset, no menstrual bleeding, some rectal bleeding since July, history of B12 def not on medications -Order labs to investigate cause of anemia. - If iron  def, may need to consider EGD/colon  Rectal Bleeding Ongoing since July, associated with mucus in stool and rectal pain, constipation. History of hemorrhoidectomy 15 years ago. Hemorrhoids noted on exam -Sitz baths, increase fiber, increase water -Hydrocortisone  supp given and external cream sent in.  -We discussed hemorrhoid banding here in the office for internal hemorrhoids if not improving with conservative treatment. - follow up for evaluation here in the office.   Constipation with large hard stool/impaction on exam Colon 2020 tics, otherwise normal, recall 2027 Bowel movements every 3-4 days, straining with bowel movements, hard stool noted on rectal exam. Patient on tramadol  and Ozempic , both of which can contribute to constipation. Miralax  not helping -Order Golytely  and advise patient to take first half on day 1 and second half on day 2, along with enemas, to address fecal impaction. -Start Linzess  after bowel prep to help with bowel movements and abdominal discomfort.  Gastroesophageal Reflux Disease (GERD) with nausea and vomiting Daily symptoms, possibly related to Ozempic . Patient on Prilosec, but still experiencing symptoms. -Increase Prilosec to 40mg  twice daily. - stop NSAIDS -Switch from Zofran  to Promethazine  for symptom control. -Provide gastroparesis diet information and consider endoscopy depending on lab results. Discussed GLP1 with the patient, mechanism of action and how this can worsen and/or cause nausea by causing gastroparesis.   Discuss with primary care see about potentially getting off this medication.  Follow-up Close  follow-up recommended due to multiple ongoing issues. Patient to message or call if any changes occur.   Patient Care Team: Lindsay Coy, MD as PCP - General (Internal Medicine) Lindsay Vinie BROCKS, MD as PCP - Cardiology (Cardiology)  HISTORY OF PRESENT ILLNESS: 53 y.o. female with a past medical history of HTN, COPD, OSA, HTN, heart failure, diabetes, IBS-C, and others listed below presents for evaluation of   Review of pertinent gastrointestinal problems: 1. Adenomatous polyps in her colon;   colonoscopy in 07/2007 with one polyp that was removed and was a hyperplastic polyp; also had hemorrhoids.  Colonoscopy  04/2015 Dr. Teressa (minor rectal bleeding); this was an incomplete colonoscopy 2 to BMI 50, I was only able to get to the ascending colon; 4 polyps were removed, 3 of them were adenomatous.  My office arranged barium enema but she never had that done. Colonoscopy 12/31/2018 with Dr. Teressa good bowel prep diverticulosis left colon no polyps or cancers recall 7 years 2.  Dyspepsia led to upper endoscopy May 2012.  Minor gastritis was noted, biopsies showed no sign of H. Pylori. Ultrasound June 2012 was normal.   HIDA scan June 2012 was also normal.   CT scan August 2012 was essentially normal except for prominent stool  08/10/2022 CT abdomen pelvis with contrast for abdominal pain shows mild diverticulosis without diverticulitis Initial CT and pelvis with right lower quadrant abdominal pain showed slightly enlarged appendix without any acute inflammation, suggested repeat 12 to 24 hours 08/10/2022 repeat CT abdomen pelvis with contrast for right lower quadrant abdominal pain showed prominent stool throughout the colon favors constipation, normal appendix, suspected 2.1 cm right uterine fibroid and aortic atherosclerosis  08/19/2022 labs reviewed show hemoglobin 10.6, has had 3 months of normocytic anemia MCV 86.1, platelets  484, normal liver and kidney 09/25/2022 internal medicine visit  with her primary care after multiple falls leading to ER visits, continue abdominal pain low-grade fever.  Patient was being tapered off tramadol  for fear of serotonergic effect.  She is going from tramadol  50 mg every 4 hours every 6 hours Patient is complaining of rectal bleeding had history of hemorrhoidectomy with hemorrhoids noted on exam given suppository and discussed possible banding in the office.  Patient had normal colonoscopy 2020 with diverticulosis recall 2027.  Having bowel movement every 3 to 4 days worsening with tramadol  and Ozempic  MiraLAX  not helping given GoLytely  and enemas to address fecal impaction appreciated on exam.  Started Linzess  after bowel movements.  12/03/2022 office visit with myself for new onset anemia, patient had normal iron  ferritin slight decreased saturation ratio we check iron  ferritin.  She had normal B12, kidney liver.  Thyroid  was in hyperthyroidism range with TSH 0.10 suggest following up with primary care.  Sed rate 48. 12/03/2022 office visit with myself for rectal bleeding, constipation with hard rectal stool and exam, GERD with nausea and vomiting and anemia.  Patient was given GoLytely  and was post to start on Linzess  after bowel prep and enemas for possible fecal impaction. 12/03/2022 Hgb 11.3, platelets 408, iron  45, ferritin normal at 86, normal B12, normal kidney liver Thyroid  with hyperthyroidism sent to primary care. Sedimentation rate 48 but has been elevated for at least the last 6 years  abdominal ultrasound this was not done.  She  reports that she quit smoking about 3 years ago. Her smoking use included cigarettes. She started smoking about 28 years ago. She has a 2.5 pack-year smoking history. She has never used smokeless tobacco. She reports that she does not currently use alcohol. She reports that she does not currently use drugs after having used the following drugs: Marijuana. Frequency: 3.00 times per week.  RELEVANT LABS AND  IMAGING:  Results          CBC    Component Value Date/Time   WBC 8.0 01/13/2023 0341   RBC 3.77 (L) 01/13/2023 0341   HGB 10.1 (L) 01/13/2023 0341   HGB 11.4 08/07/2022 1024   HCT 31.6 (L) 01/13/2023 0341   HCT 34.5 08/07/2022 1024   PLT 330 01/13/2023 0341   PLT 421 08/07/2022 1024   MCV 83.8 01/13/2023 0341   MCV 83 08/07/2022 1024   MCH 26.8 01/13/2023 0341   MCHC 32.0 01/13/2023 0341   RDW 15.9 (H) 01/13/2023 0341   RDW 14.3 08/07/2022 1024   LYMPHSABS 2.8 01/12/2023 1240   LYMPHSABS 2.0 08/07/2022 1024   MONOABS 0.7 01/12/2023 1240   EOSABS 0.2 01/12/2023 1240   EOSABS 0.2 08/07/2022 1024   BASOSABS 0.0 01/12/2023 1240   BASOSABS 0.0 08/07/2022 1024   Recent Labs    05/16/22 0852 05/16/22 0858 08/07/22 1024 08/09/22 2024 08/10/22 0820 08/11/22 0223 08/19/22 1027 12/03/22 1014 01/12/23 1240 01/13/23 0341  HGB 11.6* 12.2 11.4 10.1* 10.9* 9.3* 10.6* 11.3* 10.2* 10.1*    CMP     Component Value Date/Time   NA 137 01/14/2023 0549   NA 142 08/07/2022 1024   K 3.7 01/14/2023 0549   CL 106 01/14/2023 0549   CO2 22 01/14/2023 0549   GLUCOSE 112 (H) 01/14/2023 0549   BUN 7 01/14/2023 0549   BUN 12 08/07/2022 1024   CREATININE 0.96 01/14/2023 0549   CREATININE 0.88 09/15/2013 1032   CALCIUM  8.6 (L) 01/14/2023 0549   PROT 6.1 (L)  01/12/2023 1240   PROT 6.7 08/07/2022 1024   ALBUMIN  3.2 (L) 01/12/2023 1240   ALBUMIN  4.3 08/07/2022 1024   AST 12 (L) 01/12/2023 1240   ALT 8 01/12/2023 1240   ALKPHOS 60 01/12/2023 1240   BILITOT 0.5 01/12/2023 1240   BILITOT <0.2 08/07/2022 1024   GFRNONAA >60 01/14/2023 0549   GFRAA 67 02/02/2020 0933      Latest Ref Rng & Units 01/12/2023   12:40 PM 12/03/2022   10:14 AM 08/19/2022   10:27 AM  Hepatic Function  Total Protein 6.5 - 8.1 g/dL 6.1  7.3  6.7   Albumin  3.5 - 5.0 g/dL 3.2  4.1  3.5   AST 15 - 41 U/L 12  6  12    ALT 0 - 44 U/L 8  6  10    Alk Phosphatase 38 - 126 U/L 60  54  67   Total Bilirubin <1.2  mg/dL 0.5  0.4  0.2       Current Medications:   Current Outpatient Medications (Endocrine & Metabolic):    Semaglutide , 2 MG/DOSE, (OZEMPIC , 2 MG/DOSE,) 8 MG/3ML SOPN, Inject 2 mg into the skin once a week.  Current Outpatient Medications (Cardiovascular):    atorvastatin  (LIPITOR) 40 MG tablet, TAKE 1 TABLET BY MOUTH EVERY DAY   metoprolol  succinate (TOPROL -XL) 50 MG 24 hr tablet, Take 1 tablet (50 mg total) by mouth daily.   sacubitril -valsartan  (ENTRESTO ) 24-26 MG, Take 1 tablet by mouth 2 (two) times daily.   torsemide  (DEMADEX ) 20 MG tablet, TAKE 1 TABLET BY MOUTH TWICE A DAY  Current Outpatient Medications (Respiratory):    albuterol  (PROVENTIL ) (2.5 MG/3ML) 0.083% nebulizer solution, Take 3 mLs (2.5 mg total) by nebulization every 6 (six) hours as needed for wheezing or shortness of breath.   albuterol  (VENTOLIN  HFA) 108 (90 Base) MCG/ACT inhaler, TAKE 2 PUFFS BY MOUTH EVERY 6 HOURS AS NEEDED FOR WHEEZE OR SHORTNESS OF BREATH   Azelastine  HCl 137 MCG/SPRAY SOLN, PLACE 1 SPRAY INTO BOTH NOSTRILS 2 (TWO) TIMES DAILY. USE IN EACH NOSTRIL AS DIRECTED   cetirizine  (ZYRTEC ) 10 MG tablet, Take 1 tablet (10 mg total) by mouth daily.   fluticasone  (FLONASE ) 50 MCG/ACT nasal spray, SPRAY 1 SPRAY INTO EACH NOSTRIL DAILY   ipratropium (ATROVENT ) 0.06 % nasal spray, PLEASE SEE ATTACHED FOR DETAILED DIRECTIONS   promethazine  (PHENERGAN ) 25 MG tablet, TAKE 1 TABLET BY MOUTH EVERY 6 HOURS AS NEEDED FOR NAUSEA OR VOMITING.  Current Outpatient Medications (Analgesics):    acetaminophen  (TYLENOL ) 500 MG tablet, Take 1 tablet (500 mg total) by mouth every 6 (six) hours as needed. (Patient not taking: Reported on 12/03/2022)   ibuprofen  (ADVIL ) 200 MG tablet, Take 2 tablets (400 mg total) by mouth every 8 (eight) hours as needed for moderate pain (pain score 4-6).   oxyCODONE  (OXY IR/ROXICODONE ) 5 MG immediate release tablet, Take 1 tablet (5 mg total) by mouth every 6 (six) hours as needed for severe  pain (pain score 7-10).   SUMAtriptan  (IMITREX ) 50 MG tablet, Take 1 tablet (50 mg total) by mouth every 2 (two) hours as needed for up to 10 doses for migraine. May repeat in 2 hours if headache persists or recurs. (Patient not taking: Reported on 12/03/2022)   traMADol  (ULTRAM ) 50 MG tablet, Take 1 tablet (50 mg total) by mouth every 6 (six) hours as needed.   Current Outpatient Medications (Other):    ACCU-CHEK GUIDE test strip, USE ONE TEST STRIP TO CHECK BLOOD SUGARS ONCE A  DAY AS NEEDED (Patient not taking: Reported on 01/12/2023)   Accu-Chek Softclix Lancets lancets, USE ONE LANCET TO CHECK BLOOD SUGAR ONE A DAY AS NEEDED (Patient not taking: Reported on 01/12/2023)   Blood Glucose Monitoring Suppl (ACCU-CHEK GUIDE ME) w/Device KIT, Dispense one device   Brexpiprazole  (REXULTI ) 3 MG TABS, Take 1 tablet (3 mg total) by mouth daily.   dicyclomine  (BENTYL ) 10 MG capsule, Take 1 capsule (10 mg total) by mouth 3 (three) times daily as needed for spasms.   doxepin  (SINEQUAN ) 25 MG capsule, Take 1 capsule (25 mg total) by mouth at bedtime.   lamoTRIgine  (LAMICTAL ) 100 MG tablet, Take 1 tablet (100 mg total) by mouth daily.   linaclotide  (LINZESS ) 290 MCG CAPS capsule, Take 1 capsule (290 mcg total) by mouth daily before breakfast.   megestrol  (MEGACE ) 40 MG tablet, Take 2 tablets (80 mg total) by mouth daily.   omeprazole  (PRILOSEC) 40 MG capsule, TAKE 1 CAPSULE (40 MG TOTAL) BY MOUTH DAILY.   ondansetron  (ZOFRAN -ODT) 8 MG disintegrating tablet, Take 1 tablet (8 mg total) by mouth every 8 (eight) hours as needed.   tiZANidine  (ZANAFLEX ) 4 MG tablet, Take 1 tablet (4 mg total) by mouth every 8 (eight) hours as needed for muscle spasms.  Medical History:  Past Medical History:  Diagnosis Date   Acid reflux    Anemia    Iron  Def   Anorexia    CHF (congestive heart failure) (HCC)    Chronic kidney disease    Nephrotic syndrome   Colon polyp 2009   Depression with anxiety    Edema leg     Fibroid tumor 04/2022   Fibromyalgia    Hemorrhoids    Hidradenitis suppurativa    Hypertension    IBS (irritable bowel syndrome)    Low back pain    Migraines    Morbidly obese (HCC)    Neuromuscular disorder (HCC)    fibromyalgia   Neuropathy    Panic attacks    Polyp of vocal cord or larynx    Stroke (HCC)    Tonsil pain    Allergies:  Allergies  Allergen Reactions   Lisinopril  Rash and Cough     Surgical History:  She  has a past surgical history that includes Hemorrhoid surgery; Upper gastrointestinal endoscopy; Axillary hidradenitis excision; Inguinal hidradenitis excision; Tonsillectomy (10/18/2010); Colonoscopy with propofol  (N/A, 05/25/2015); and Colonoscopy with propofol  (N/A, 12/31/2018). Family History:  Her family history includes Anxiety disorder in her mother and sister; Cancer in her father; Depression in her mother, sister, and sister; Diabetes in her mother; Heart disease in her father and mother; High blood pressure in her mother; Kidney failure in her mother; Learning disabilities in her sister.  REVIEW OF SYSTEMS  : All other systems reviewed and negative except where noted in the History of Present Illness.  PHYSICAL EXAM: There were no vitals taken for this visit. General Appearance: Well nourished, in no apparent distress. Head:   Normocephalic and atraumatic. Eyes:  sclerae anicteric,conjunctive pink  Respiratory: Respiratory effort normal, BS equal bilaterally without rales, rhonchi, wheezing. Cardio: RRR with no MRGs. Peripheral pulses intact.  Abdomen: Soft,  Obese ,active bowel sounds. mild tenderness in the RUQ and in the lower abdomen. Without guarding and Without rebound. No masses. RECTAL/ANOSCOPY: *** Musculoskeletal: Full ROM, Normal gait. Without edema. Skin:  Dry and intact without significant lesions or rashes Neuro: Alert and  oriented x4;  No focal deficits. Psych:  Cooperative. Normal mood and affect.  Alan JONELLE Coombs, PA-C 1:10  PM

## 2023-02-03 ENCOUNTER — Telehealth: Payer: Self-pay | Admitting: *Deleted

## 2023-02-03 ENCOUNTER — Other Ambulatory Visit: Payer: Self-pay | Admitting: Internal Medicine

## 2023-02-03 DIAGNOSIS — M797 Fibromyalgia: Secondary | ICD-10-CM

## 2023-02-03 DIAGNOSIS — F1190 Opioid use, unspecified, uncomplicated: Secondary | ICD-10-CM

## 2023-02-03 NOTE — Telephone Encounter (Signed)
 Tramadol refill declined, this was tapered off after her recent hospitalization.

## 2023-02-03 NOTE — Telephone Encounter (Signed)
 Patient called said that request for the Tramadol was refused. Wants to know what she will need to do know for her pain?  Has appointment next week.

## 2023-02-03 NOTE — Telephone Encounter (Signed)
 Call to patient states she has tried this before over a weekend.  Stated it did not work and she was using the Tylenol Arthritis 650 mg tablets.  Would like to discuss with PCP as to what she can take.

## 2023-02-03 NOTE — Telephone Encounter (Signed)
 Alternate tylenol 1,000 mg  every 8 hours and Ibuprofen 800 mg every 8 hours (take tylenol, then 4 hours later take ibuprofen, 4 hours later tylenol, etc.)

## 2023-02-04 ENCOUNTER — Encounter: Payer: Self-pay | Admitting: Internal Medicine

## 2023-02-04 NOTE — Telephone Encounter (Signed)
 Pt called again ... In regards to her med  . She stated  the no one called her back so I allowed her to know that the RN did reach out to PCP who in turn sent a message  back and  the Rn did reply she is now waiting on the DR decision  that is why she has not heard nothing back from the RN

## 2023-02-05 ENCOUNTER — Ambulatory Visit: Payer: 59 | Admitting: Physician Assistant

## 2023-02-05 NOTE — Telephone Encounter (Signed)
 Call to patient informed her she will need to have a TeleHealth appointment to manage her pain.  No available until Friday afternoon.  Will schedule for a Friday afternoon call with the doctor.  Patient agreed and will await call to discuss her pain management.

## 2023-02-05 NOTE — Telephone Encounter (Signed)
 You can offer her a next available telehealth appointment with a resident to discuss pain management, otherwise I will see her at her upcoming appointment to address. I do not have anything sooner and this isn't appropriate to continue to address outside of a visit. Thanks.

## 2023-02-06 DIAGNOSIS — E559 Vitamin D deficiency, unspecified: Secondary | ICD-10-CM | POA: Diagnosis not present

## 2023-02-06 DIAGNOSIS — J449 Chronic obstructive pulmonary disease, unspecified: Secondary | ICD-10-CM | POA: Diagnosis not present

## 2023-02-06 DIAGNOSIS — J9610 Chronic respiratory failure, unspecified whether with hypoxia or hypercapnia: Secondary | ICD-10-CM | POA: Diagnosis not present

## 2023-02-06 DIAGNOSIS — M797 Fibromyalgia: Secondary | ICD-10-CM | POA: Diagnosis not present

## 2023-02-06 DIAGNOSIS — E119 Type 2 diabetes mellitus without complications: Secondary | ICD-10-CM | POA: Diagnosis not present

## 2023-02-06 DIAGNOSIS — I110 Hypertensive heart disease with heart failure: Secondary | ICD-10-CM | POA: Diagnosis not present

## 2023-02-06 DIAGNOSIS — G43909 Migraine, unspecified, not intractable, without status migrainosus: Secondary | ICD-10-CM | POA: Diagnosis not present

## 2023-02-06 DIAGNOSIS — E785 Hyperlipidemia, unspecified: Secondary | ICD-10-CM | POA: Diagnosis not present

## 2023-02-06 DIAGNOSIS — N049 Nephrotic syndrome with unspecified morphologic changes: Secondary | ICD-10-CM | POA: Diagnosis not present

## 2023-02-06 DIAGNOSIS — Z9981 Dependence on supplemental oxygen: Secondary | ICD-10-CM | POA: Diagnosis not present

## 2023-02-06 DIAGNOSIS — I5020 Unspecified systolic (congestive) heart failure: Secondary | ICD-10-CM | POA: Diagnosis not present

## 2023-02-06 DIAGNOSIS — K219 Gastro-esophageal reflux disease without esophagitis: Secondary | ICD-10-CM | POA: Diagnosis not present

## 2023-02-06 DIAGNOSIS — K589 Irritable bowel syndrome without diarrhea: Secondary | ICD-10-CM | POA: Diagnosis not present

## 2023-02-06 DIAGNOSIS — M419 Scoliosis, unspecified: Secondary | ICD-10-CM | POA: Diagnosis not present

## 2023-02-06 DIAGNOSIS — I959 Hypotension, unspecified: Secondary | ICD-10-CM | POA: Diagnosis not present

## 2023-02-06 DIAGNOSIS — D649 Anemia, unspecified: Secondary | ICD-10-CM | POA: Diagnosis not present

## 2023-02-06 DIAGNOSIS — Z7985 Long-term (current) use of injectable non-insulin antidiabetic drugs: Secondary | ICD-10-CM | POA: Diagnosis not present

## 2023-02-06 DIAGNOSIS — G4733 Obstructive sleep apnea (adult) (pediatric): Secondary | ICD-10-CM | POA: Diagnosis not present

## 2023-02-06 DIAGNOSIS — S92355D Nondisplaced fracture of fifth metatarsal bone, left foot, subsequent encounter for fracture with routine healing: Secondary | ICD-10-CM | POA: Diagnosis not present

## 2023-02-06 DIAGNOSIS — L732 Hidradenitis suppurativa: Secondary | ICD-10-CM | POA: Diagnosis not present

## 2023-02-06 DIAGNOSIS — E611 Iron deficiency: Secondary | ICD-10-CM | POA: Diagnosis not present

## 2023-02-07 ENCOUNTER — Ambulatory Visit: Payer: 59 | Admitting: Student

## 2023-02-07 ENCOUNTER — Encounter: Payer: Self-pay | Admitting: Internal Medicine

## 2023-02-07 ENCOUNTER — Telehealth (HOSPITAL_COMMUNITY): Payer: Self-pay | Admitting: *Deleted

## 2023-02-07 ENCOUNTER — Encounter (HOSPITAL_COMMUNITY): Payer: Self-pay

## 2023-02-07 ENCOUNTER — Telehealth: Payer: Self-pay | Admitting: *Deleted

## 2023-02-07 DIAGNOSIS — S92355D Nondisplaced fracture of fifth metatarsal bone, left foot, subsequent encounter for fracture with routine healing: Secondary | ICD-10-CM | POA: Diagnosis not present

## 2023-02-07 NOTE — Telephone Encounter (Signed)
 Erroneous encounter

## 2023-02-07 NOTE — Telephone Encounter (Signed)
 He is on multiple medication.  Increasing doxepin can cause postural hypotension.  She should see a therapist to deal with her panic attacks.  She can try over-the-counter melatonin.

## 2023-02-07 NOTE — Telephone Encounter (Signed)
 Per pharmacy too early to fill 25 mg

## 2023-02-07 NOTE — Telephone Encounter (Signed)
 Call from Gracie, PT [facility].  Did assessment on patient on yesterday 02/06/2023.  Would like Verbal Orders for PT 1 time a week for 4 weeks and then 2 times a week for 4 weeks.  Strengthening, Transfers, Safety, Fall Prevention, and Home Exercise Program.  Patient has an appointment with Orthopedics next week will await results from appointment for further needs.   Verbal Approval was given will forward to PCP for approval or denial.

## 2023-02-07 NOTE — Telephone Encounter (Signed)
Approve, thank you.

## 2023-02-07 NOTE — Progress Notes (Signed)
  [facility] Internal Medicine Residency Telephone Encounter Continuity Care Appointment  HPI:  This telephone encounter was created for [name] on 02/07/2023 for the following purpose/cc Foot pain.  Patient endorses continued left sided foot pain since being discharged after 12/15-12/15 admission 2/2 near syncope and fall and was found to have nondisplaced left 2nd-5th metatarsal neck/head fractures.  Since discharge, patient has been managing pain with a walking boot, naproxen /Voltaren  gel, and two 650 mg Tylenol  4 times daily.  Also given 5-day course of oxycodone  on 12/21. Pain is only slightly improved with above.    Past Medical History:  Past Medical History:  Diagnosis Date   Acid reflux    Anemia    Iron  Def   Anorexia    CHF (congestive heart failure) (HCC)    Chronic kidney disease    Nephrotic syndrome   Colon polyp 2009   Depression with anxiety    Edema leg    Fibroid tumor 04/2022   Fibromyalgia    Hemorrhoids    Hidradenitis suppurativa    Hypertension    IBS (irritable bowel syndrome)    Low back pain    Migraines    Morbidly obese (HCC)    Neuromuscular disorder (HCC)    fibromyalgia   Neuropathy    Panic attacks    Polyp of vocal cord or larynx    Stroke (HCC)    Tonsil pain       Assessment / Plan / Recommendations:  Nondisplaced Fracture of Left 2-5th Metatarsal Neck/Heads: Patient admitted 12/15 - 12/17 for near syncope and fall and was found to have nondisplaced fractures as mentioned above.  Patient calls today regarding pain and requesting tramadol  due to pain unrelieved by Tylenol /Naproxen /Voltaren  gel.  Upon chart review and discussion with attending, further opioid prescriptions no longer appropriate as patient has not been compliant with previous opioid contract.  Also given near syncope/fall further opioid prescription could exacerbate this.  Extensive discussion with the patient regarding risks associated with continued opioid therapy.   Patient frustrated but understanding of the situation. Patient advise to alternate between ice and heat and to limit weightbearing to as little as possible in the meantime.  In regards to current pain management, advised patient to limit Tylenol  to a max of 4 g/day. Follow-up scheduled in [facility] next week and Ortho on the 1/21.   Consent and Medical Decision Making:  Patient discussed with Dr. Guilloud This is a telephone encounter between Lindsay [name] and Lindsay [name] on 02/07/2023 for left foot pain. The visit was conducted with the patient located at home and Lindsay [name] at [facility]. The patient's identity was confirmed using their DOB and current address. The patient has consented to being evaluated through a telephone encounter and understands the associated risks (an examination cannot be done and the patient may need to come in for an appointment) / benefits (allows the patient to remain at home, decreasing exposure to coronavirus). I personally spent 20 minutes on medical discussion.

## 2023-02-10 ENCOUNTER — Other Ambulatory Visit: Payer: Self-pay | Admitting: Student

## 2023-02-10 ENCOUNTER — Ambulatory Visit: Payer: 59 | Admitting: Physician Assistant

## 2023-02-11 DIAGNOSIS — I110 Hypertensive heart disease with heart failure: Secondary | ICD-10-CM | POA: Diagnosis not present

## 2023-02-11 DIAGNOSIS — L732 Hidradenitis suppurativa: Secondary | ICD-10-CM | POA: Diagnosis not present

## 2023-02-11 DIAGNOSIS — S92355D Nondisplaced fracture of fifth metatarsal bone, left foot, subsequent encounter for fracture with routine healing: Secondary | ICD-10-CM | POA: Diagnosis not present

## 2023-02-11 DIAGNOSIS — E559 Vitamin D deficiency, unspecified: Secondary | ICD-10-CM | POA: Diagnosis not present

## 2023-02-11 DIAGNOSIS — Z7985 Long-term (current) use of injectable non-insulin antidiabetic drugs: Secondary | ICD-10-CM | POA: Diagnosis not present

## 2023-02-11 DIAGNOSIS — N049 Nephrotic syndrome with unspecified morphologic changes: Secondary | ICD-10-CM | POA: Diagnosis not present

## 2023-02-11 DIAGNOSIS — J9610 Chronic respiratory failure, unspecified whether with hypoxia or hypercapnia: Secondary | ICD-10-CM | POA: Diagnosis not present

## 2023-02-11 DIAGNOSIS — E611 Iron deficiency: Secondary | ICD-10-CM | POA: Diagnosis not present

## 2023-02-11 DIAGNOSIS — I5020 Unspecified systolic (congestive) heart failure: Secondary | ICD-10-CM | POA: Diagnosis not present

## 2023-02-11 DIAGNOSIS — K589 Irritable bowel syndrome without diarrhea: Secondary | ICD-10-CM | POA: Diagnosis not present

## 2023-02-11 DIAGNOSIS — Z9981 Dependence on supplemental oxygen: Secondary | ICD-10-CM | POA: Diagnosis not present

## 2023-02-11 DIAGNOSIS — M419 Scoliosis, unspecified: Secondary | ICD-10-CM | POA: Diagnosis not present

## 2023-02-11 DIAGNOSIS — J449 Chronic obstructive pulmonary disease, unspecified: Secondary | ICD-10-CM | POA: Diagnosis not present

## 2023-02-11 DIAGNOSIS — I959 Hypotension, unspecified: Secondary | ICD-10-CM | POA: Diagnosis not present

## 2023-02-11 DIAGNOSIS — D649 Anemia, unspecified: Secondary | ICD-10-CM | POA: Diagnosis not present

## 2023-02-11 DIAGNOSIS — G4733 Obstructive sleep apnea (adult) (pediatric): Secondary | ICD-10-CM | POA: Diagnosis not present

## 2023-02-11 DIAGNOSIS — E785 Hyperlipidemia, unspecified: Secondary | ICD-10-CM | POA: Diagnosis not present

## 2023-02-11 DIAGNOSIS — E119 Type 2 diabetes mellitus without complications: Secondary | ICD-10-CM | POA: Diagnosis not present

## 2023-02-11 DIAGNOSIS — K219 Gastro-esophageal reflux disease without esophagitis: Secondary | ICD-10-CM | POA: Diagnosis not present

## 2023-02-11 DIAGNOSIS — G43909 Migraine, unspecified, not intractable, without status migrainosus: Secondary | ICD-10-CM | POA: Diagnosis not present

## 2023-02-11 DIAGNOSIS — M797 Fibromyalgia: Secondary | ICD-10-CM | POA: Diagnosis not present

## 2023-02-11 NOTE — Progress Notes (Signed)
 Internal Medicine Clinic Attending  Case discussed with the resident at the time of the visit.  We reviewed the resident's history and exam and pertinent patient test results.  I agree with the assessment, diagnosis, and plan of care documented in the resident's note.

## 2023-02-12 ENCOUNTER — Encounter: Payer: Self-pay | Admitting: Internal Medicine

## 2023-02-12 ENCOUNTER — Ambulatory Visit (HOSPITAL_COMMUNITY): Payer: 59 | Admitting: Licensed Clinical Social Worker

## 2023-02-12 ENCOUNTER — Ambulatory Visit (INDEPENDENT_AMBULATORY_CARE_PROVIDER_SITE_OTHER): Payer: 59 | Admitting: Internal Medicine

## 2023-02-12 VITALS — BP 133/68 | HR 100 | Temp 98.7°F | Ht 70.0 in

## 2023-02-12 DIAGNOSIS — I10 Essential (primary) hypertension: Secondary | ICD-10-CM

## 2023-02-12 DIAGNOSIS — F331 Major depressive disorder, recurrent, moderate: Secondary | ICD-10-CM

## 2023-02-12 DIAGNOSIS — M797 Fibromyalgia: Secondary | ICD-10-CM

## 2023-02-12 DIAGNOSIS — K589 Irritable bowel syndrome without diarrhea: Secondary | ICD-10-CM

## 2023-02-12 DIAGNOSIS — F1190 Opioid use, unspecified, uncomplicated: Secondary | ICD-10-CM

## 2023-02-12 DIAGNOSIS — F4312 Post-traumatic stress disorder, chronic: Secondary | ICD-10-CM

## 2023-02-12 DIAGNOSIS — G894 Chronic pain syndrome: Secondary | ICD-10-CM

## 2023-02-12 DIAGNOSIS — F410 Panic disorder [episodic paroxysmal anxiety] without agoraphobia: Secondary | ICD-10-CM

## 2023-02-12 DIAGNOSIS — S92302D Fracture of unspecified metatarsal bone(s), left foot, subsequent encounter for fracture with routine healing: Secondary | ICD-10-CM

## 2023-02-12 DIAGNOSIS — G8929 Other chronic pain: Secondary | ICD-10-CM | POA: Diagnosis not present

## 2023-02-12 DIAGNOSIS — F411 Generalized anxiety disorder: Secondary | ICD-10-CM

## 2023-02-12 DIAGNOSIS — M79671 Pain in right foot: Secondary | ICD-10-CM | POA: Insufficient documentation

## 2023-02-12 DIAGNOSIS — E0800 Diabetes mellitus due to underlying condition with hyperosmolarity without nonketotic hyperglycemic-hyperosmolar coma (NKHHC): Secondary | ICD-10-CM

## 2023-02-12 MED ORDER — DICYCLOMINE HCL 10 MG PO CAPS: 10.0000 mg | ORAL_CAPSULE | Freq: Three times a day (TID) | ORAL | 0 refills | Status: DC | PRN

## 2023-02-12 NOTE — Progress Notes (Addendum)
Virtual Visit via Video Note  I connected with [name] on 02/21/23 at 10:00 AM EST by a video enabled telemedicine application and verified that I am speaking with the correct person using two identifiers.  Location: Patient: in a taxi Provider: home office   I discussed the limitations of evaluation and management by telemedicine and the availability of in person appointments. The patient expressed understanding and agreed to proceed.   I discussed the assessment and treatment plan with the patient. The patient was provided an opportunity to ask questions and all were answered. The patient agreed with the plan and demonstrated an understanding of the instructions.   The patient was advised to call back or seek an in-person evaluation if the symptoms worsen or if the condition fails to improve as anticipated.  I provided 25 minutes of non-face-to-face time during this encounter.  THERAPIST PROGRESS NOTE  Session Time: 10:00 AM to 10:25 AM  Participation Level: Active  Behavioral Response: CasualAlertIrritable  Type of Therapy: Individual Therapy  Treatment Goals addressed: patient continue to utilize therapy for  supportive and strength-based interventions, provide treatment interventions in the context of patient continuing to seek help and cope with medical issues, anxiety, triggers, coping with mental health symptoms, trauma, utilize therapy as a way for patient to focus on working through current stressors that also helps to distract from pain ProgressTowards Goals: Progressing-Therapist impression was patient frustrated pointed out she seemed angry patient said no not the case reality is reality.  Therapist continues to to be a support we kept the session short as patient went from taxi to grocery store therapist also felt some space between hearing her doctor was leaving and next session may be helpful to process thoughts and feelings  Interventions: Solution Focused,  Strength-based, Supportive, and Other: coping, reframing  Summary: Bahar Shelden is a 53 y.o. female who presents with PCP check in doctor said going to [place]. [name] will refer don't trust anybody so have to look for another doctor. Toe is broke. Record passed out didn't. Panic attacks sent message to doctor didn't respond to her. Have to go emergency ortho on the [address]. Not mad reality is reality is what patient says. Therapist said whatever therapist is saying not helping and thinking to end session early.   Therapist attempted to paraphrase patient's comment that it is reality and not mad by saying it is what it is therapist thought was a paraphrase patient said don't say that to black person is not the same thing.  She did not get labs done today she just had them done a month ago did not get A1c tested today needed to get to this appointment therapist was glad that she has an appointment for her toes that was a positive.  Therapist pointed out has appointment next week may be in a better place to talk she just found out doctors leaving not the best news.  Therapist relayed what she saw in  record Dr. Sheela Stack note is guiding patient to work on panic attacks with therapist giving therapist an idea what we can work on but also also not substitute for doctor's advice. But his note does give [name] some direction.  In a grocery store. Heart beating fast and found out this lady is leaving.  We decided to continue next session which is next week.  Suicidal/Homicidal: No  Plan: Return again in 1 week.2.  Explained to the patient what is nocturnal panic attacks work on panic attacks process  thoughts and feelings in session to help with coping  Diagnosis: Major depressive disorder, recurrent, moderate, generalized anxiety disorder, chronic PTSD, panic attacks   Collaboration of Care: Medication Management AEB review of Dr. Gilmore Laroche message and chart in response patient message  Patient/Guardian was  advised Release of Information must be obtained prior to any record release in order to collaborate their care with an outside provider. Patient/Guardian was advised if they have not already done so to contact the registration department to sign all necessary forms in order for [name] to release information regarding their care.   Consent: Patient/Guardian gives verbal consent for treatment and assignment of benefits for services provided during this visit. Patient/Guardian expressed understanding and agreed to proceed.   Coolidge Breeze, LCSW 02/21/2023

## 2023-02-12 NOTE — Assessment & Plan Note (Signed)
 Diabetes was not addressed today as patient was in a [place] to get to another appointment. Unable to get on scale due to CAM boot and foot fracture. Reports on going weight loss on Ozempic  which she is happy about. Encouraged her to keep up the hard work with her diet. Will need Hgb A1c rechecked at follow up.

## 2023-02-12 NOTE — Assessment & Plan Note (Signed)
 Pain over multiple metatarsal heads since her fall on 12/15, no swelling or bruising. Was not imaged initially.  -- right foot xray

## 2023-02-12 NOTE — Assessment & Plan Note (Signed)
 Chronic and well controlled. Has some changes to her regimen during recent hospitalization due to low BPs. Metoprolol  was discontinued and entresto  was decreased. BP today is at goal. She has been losing weight on Ozempic  and I suspect this is the reason for her improved BP control. Continue current regiment.

## 2023-02-12 NOTE — Progress Notes (Signed)
 Subjective:   Patient ID: Lindsay [name] female   DOB: Apr 15, 1970 53 y.o.   MRN: [id number]  HPI: Lindsay [name] is a 53 y.o. female with past medical history outlined below here for hospital follow up. For further details of today's visit, please refer to the assessment and plan below.   Past Medical History:  Diagnosis Date   Acid reflux    Anemia    Iron  Def   Anorexia    CHF (congestive heart failure) (HCC)    Chronic kidney disease    Nephrotic syndrome   Colon polyp 2009   Depression with anxiety    Edema leg    Fibroid tumor 04/2022   Fibromyalgia    Hemorrhoids    Hidradenitis suppurativa    Hypertension    IBS (irritable bowel syndrome)    Low back pain    Migraines    Morbidly obese (HCC)    Neuromuscular disorder (HCC)    fibromyalgia   Neuropathy    Panic attacks    Polyp of vocal cord or larynx    Stroke (HCC)    Tonsil pain    Current Outpatient Medications  Medication Sig Dispense Refill   ACCU-CHEK GUIDE test strip USE ONE TEST STRIP TO CHECK BLOOD SUGARS ONCE A DAY AS NEEDED (Patient not taking: Reported on 01/12/2023) 100 strip 1   Accu-Chek Softclix Lancets lancets USE ONE LANCET TO CHECK BLOOD SUGAR ONE A DAY AS NEEDED (Patient not taking: Reported on 01/12/2023) 100 each 1   acetaminophen  (TYLENOL ) 500 MG tablet Take 1 tablet (500 mg total) by mouth every 6 (six) hours as needed. (Patient not taking: Reported on 12/03/2022) 30 tablet 0   albuterol  (PROVENTIL ) (2.5 MG/3ML) 0.083% nebulizer solution Take 3 mLs (2.5 mg total) by nebulization every 6 (six) hours as needed for wheezing or shortness of breath. 150 mL 0   albuterol  (VENTOLIN  HFA) 108 (90 Base) MCG/ACT inhaler TAKE 2 PUFFS BY MOUTH EVERY 6 HOURS AS NEEDED FOR WHEEZE OR SHORTNESS OF BREATH 8.5 each 0   atorvastatin  (LIPITOR) 40 MG tablet TAKE 1 TABLET BY MOUTH EVERY DAY 90 tablet 3   Azelastine  HCl 137 MCG/SPRAY SOLN PLACE 1 SPRAY INTO BOTH NOSTRILS 2 (TWO) TIMES DAILY. USE IN EACH NOSTRIL AS  DIRECTED 90 mL 4   Blood Glucose Monitoring Suppl (ACCU-CHEK GUIDE ME) w/Device KIT Dispense one device 1 kit 0   Brexpiprazole  (REXULTI ) 3 MG TABS Take 1 tablet (3 mg total) by mouth daily. 30 tablet 2   cetirizine  (ZYRTEC ) 10 MG tablet Take 1 tablet (10 mg total) by mouth daily. 90 tablet 1   dicyclomine  (BENTYL ) 10 MG capsule Take 1 capsule (10 mg total) by mouth 3 (three) times daily as needed for spasms. 30 capsule 0   doxepin  (SINEQUAN ) 25 MG capsule Take 1 capsule (25 mg total) by mouth at bedtime. 30 capsule 2   ENTRESTO  24-26 MG TAKE 1 TABLET BY MOUTH TWICE A DAY 60 tablet 0   fluticasone  (FLONASE ) 50 MCG/ACT nasal spray SPRAY 1 SPRAY INTO EACH NOSTRIL DAILY 32 mL 1   ibuprofen  (ADVIL ) 200 MG tablet Take 2 tablets (400 mg total) by mouth every 8 (eight) hours as needed for moderate pain (pain score 4-6). 30 tablet 0   ipratropium (ATROVENT ) 0.06 % nasal spray PLEASE SEE ATTACHED FOR DETAILED DIRECTIONS     lamoTRIgine  (LAMICTAL ) 100 MG tablet Take 1 tablet (100 mg total) by mouth daily. 30 tablet 2   linaclotide  (LINZESS ) 290 MCG CAPS  capsule Take 1 capsule (290 mcg total) by mouth daily before breakfast. 90 capsule 0   megestrol  (MEGACE ) 40 MG tablet Take 2 tablets (80 mg total) by mouth daily. 180 tablet 1   metoprolol  succinate (TOPROL -XL) 50 MG 24 hr tablet Take 1 tablet (50 mg total) by mouth daily. 90 tablet 3   omeprazole  (PRILOSEC) 40 MG capsule TAKE 1 CAPSULE (40 MG TOTAL) BY MOUTH DAILY. 90 capsule 0   ondansetron  (ZOFRAN -ODT) 8 MG disintegrating tablet Take 1 tablet (8 mg total) by mouth every 8 (eight) hours as needed. 90 tablet 0   promethazine  (PHENERGAN ) 25 MG tablet TAKE 1 TABLET BY MOUTH EVERY 6 HOURS AS NEEDED FOR NAUSEA OR VOMITING. 30 tablet 0   Semaglutide , 2 MG/DOSE, (OZEMPIC , 2 MG/DOSE,) 8 MG/3ML SOPN Inject 2 mg into the skin once a week. 9 mL 1   SUMAtriptan  (IMITREX ) 50 MG tablet Take 1 tablet (50 mg total) by mouth every 2 (two) hours as needed for up to 10 doses  for migraine. May repeat in 2 hours if headache persists or recurs. (Patient not taking: Reported on 12/03/2022) 10 tablet 0   tiZANidine  (ZANAFLEX ) 4 MG tablet Take 1 tablet (4 mg total) by mouth every 8 (eight) hours as needed for muscle spasms. 90 tablet 5   torsemide  (DEMADEX ) 20 MG tablet TAKE 1 TABLET BY MOUTH TWICE A DAY 180 tablet 1   No current facility-administered medications for this visit.   Family History  Problem Relation Age of Onset   Diabetes Mother    Heart disease Mother        valve leak   Anxiety disorder Mother    Depression Mother    High blood pressure Mother    Kidney failure Mother    Cancer Father        prostate   Heart disease Father    Learning disabilities Sister    Depression Sister    Depression Sister    Anxiety disorder Sister    Colon cancer Neg Hx    Social History   Socioeconomic History   Marital status: Single    Spouse name: Not on file   Number of children: 1   Years of education: 11   Highest education level: Not on file  Occupational History   Occupation: Disabled  Tobacco Use   Smoking status: Former    Current packs/day: 0.00    Average packs/day: 0.1 packs/day for 25.0 years (2.5 ttl pk-yrs)    Types: Cigarettes    Start date: 05/24/1994    Quit date: 05/24/2019    Years since quitting: 3.7   Smokeless tobacco: Never   Tobacco comments:    quit in April.  Vaping Use   Vaping status: Never Used  Substance and Sexual Activity   Alcohol use: Not Currently    Alcohol/week: 0.0 standard drinks of alcohol    Comment: none sine 05/2018   Drug use: Not Currently    Frequency: 3.0 times per week    Types: Marijuana   Sexual activity: Yes    Birth control/protection: Implant    Comment: placed in March 2024  Other Topics Concern   Not on file  Social History Narrative   Lives at home w/ her mother and daughter   Right-handed   Caffeine: about 3 Cokes per week   Social Drivers of Health   Financial Resource Strain:  High Risk (10/02/2022)   Overall Financial Resource Strain (CARDIA)    Difficulty of Paying Living  Expenses: Very hard  Food Insecurity: Food Insecurity Present (01/13/2023)   Hunger Vital Sign    Worried About Running Out of Food in the Last Year: Often true    Ran Out of Food in the Last Year: Often true  Transportation Needs: Unmet Transportation Needs (01/13/2023)   PRAPARE - Administrator, Civil Service (Medical): Yes    Lack of Transportation (Non-Medical): Yes  Physical Activity: Sufficiently Active (10/02/2022)   Exercise Vital Sign    Days of Exercise per Week: 7 days    Minutes of Exercise per Session: 30 min  Stress: Stress Concern Present (10/02/2022)   [employer] of Occupational Health - Occupational Stress Questionnaire    Feeling of Stress : To some extent  Social Connections: Moderately Isolated (10/02/2022)   Social Connection and Isolation Panel [NHANES]    Frequency of Communication with Friends and Family: More than three times a week    Frequency of Social Gatherings with Friends and Family: Never    Attends Religious Services: More than 4 times per year    Active Member of Clubs or Organizations: No    Attends Banker Meetings: Never    Marital Status: Never married     Objective:  Physical Exam:  Vitals:   02/12/23 0917  BP: 133/68  Pulse: 100  Temp: 98.7 F (37.1 C)  TempSrc: Oral  SpO2: 100%  Height: 5\' 10"  (1.778 m)    Constitutional: NAD, well appearing  Abdominal: Soft, non tender Extremities: Left foot in CAM boot, right foot with tenderness to palpation over multiple metatarsal heads, no swelling or bruising. Pulses intact.    Assessment & Plan:   Essential hypertension Chronic and well controlled. Has some changes to her regimen during recent hospitalization due to low BPs. Metoprolol  was discontinued and entresto  was decreased. BP today is at goal. She has been losing weight on Ozempic  and I suspect this is the  reason for her improved BP control. Continue current regiment.  IBS (irritable bowel syndrome) Refilled bentyl .   Closed nondisplaced fracture of metatarsal bone of left foot Patient was recently hospitalized after a fall; found to have closed left 2-5th metatarsal neck fractures. Ortho was consulted and she was managed conservatively, seen in a CAM boot today. Recs were WBAT and she has follow up scheduled with ortho on 1/21. She is having pain in her right foot as well with tenderness over multiple metatarsal heads that was not imaged. Reports difficulty walking and requesting xrays and a walker. I have placed xray orders for her right foot and will go ahead and repeat xrays of the left as well.  -- f/u repeat xrays, ortho scheduled on 1/21 -- DME for rolling walker  Right foot pain Pain over multiple metatarsal heads since her fall on 12/15, no swelling or bruising. Was not imaged initially.  -- right foot xray   Chronic pain Patient has very difficult to treat chronic pain. We have trialed multiple opioids, most recently tramadol  which she was having side effects from. Most worrisome was her recent fall resulting in multiple metatarsal fractures of her left foot. She was tapered off tramadol  during that hospitalization and discharged a short course of oxycodone  which has been refilled a few times. I have declined further refills and informed her that [facility] will no long be prescribing opioids moving forward. We will need to find other modalities to address her pain. I offered to re trial lyrica , gabapentin , or Cymbalta , all of which  she has declined. I have placed a referral to pain management clinic. Enouraged her to continue with physical therapy, she is having good weight loss response to Ozempic .

## 2023-02-12 NOTE — Assessment & Plan Note (Signed)
Refilled bentyl

## 2023-02-12 NOTE — Assessment & Plan Note (Signed)
 Patient was recently hospitalized after a fall; found to have closed left 2-5th metatarsal neck fractures. Ortho was consulted and she was managed conservatively, seen in a CAM boot today. Recs were WBAT and she has follow up scheduled with ortho on 1/21. She is having pain in her right foot as well with tenderness over multiple metatarsal heads that was not imaged. Reports difficulty walking and requesting xrays and a walker. I have placed xray orders for her right foot and will go ahead and repeat xrays of the left as well.  -- f/u repeat xrays, ortho scheduled on 1/21 -- DME for rolling walker

## 2023-02-12 NOTE — Patient Instructions (Signed)
 Ms. Nicotera,  I will call you with the results of your xrays once they result. Please keep your appointment to follow up with the orthopedic surgeon. I have also placed an order for a walker, you should be contacted about this.  Please follow up with us  or another primary care office again in 3 months.   It has been a pleasure taking care of you. I wish you all the best!  Dr. Crissie Dome

## 2023-02-12 NOTE — Assessment & Plan Note (Signed)
 Patient has very difficult to treat chronic pain. We have trialed multiple opioids, most recently tramadol  which she was having side effects from. Most worrisome was her recent fall resulting in multiple metatarsal fractures of her left foot. She was tapered off tramadol  during that hospitalization and discharged a short course of oxycodone  which has been refilled a few times. I have declined further refills and informed her that [facility] will no long be prescribing opioids moving forward. We will need to find other modalities to address her pain. I offered to re trial lyrica , gabapentin , or Cymbalta , all of which she has declined. I have placed a referral to pain management clinic. Enouraged her to continue with physical therapy, she is having good weight loss response to Ozempic .

## 2023-02-13 ENCOUNTER — Telehealth: Payer: Self-pay | Admitting: *Deleted

## 2023-02-13 NOTE — Telephone Encounter (Signed)
Call from [place] [facility] with [facility] to report missed PT visit today.  Stated pt stated she was overwhelmed.

## 2023-02-14 ENCOUNTER — Other Ambulatory Visit (HOSPITAL_COMMUNITY): Payer: Self-pay | Admitting: Psychiatry

## 2023-02-14 DIAGNOSIS — F331 Major depressive disorder, recurrent, moderate: Secondary | ICD-10-CM

## 2023-02-14 DIAGNOSIS — F4312 Post-traumatic stress disorder, chronic: Secondary | ICD-10-CM

## 2023-02-17 ENCOUNTER — Encounter: Payer: Self-pay | Admitting: Internal Medicine

## 2023-02-17 ENCOUNTER — Other Ambulatory Visit (HOSPITAL_COMMUNITY): Payer: Self-pay | Admitting: Psychiatry

## 2023-02-17 ENCOUNTER — Telehealth: Payer: Self-pay | Admitting: Internal Medicine

## 2023-02-17 ENCOUNTER — Other Ambulatory Visit: Payer: Self-pay | Admitting: Internal Medicine

## 2023-02-17 DIAGNOSIS — A084 Viral intestinal infection, unspecified: Secondary | ICD-10-CM

## 2023-02-17 DIAGNOSIS — F331 Major depressive disorder, recurrent, moderate: Secondary | ICD-10-CM

## 2023-02-17 MED ORDER — GABAPENTIN 100 MG PO CAPS: 100.0000 mg | ORAL_CAPSULE | Freq: Three times a day (TID) | ORAL | 2 refills | Status: DC

## 2023-02-17 NOTE — Telephone Encounter (Signed)
Gabapentin [address]  Paged by operator for patient requesting a phone call back. She explains that she is having a lot of pain from her broken foot and that she can't get in with [facility] until February 4  She wants to know what can be done for her pain.  I have reviewed her last OV with Dr. Antony Contras who told her at that visit that the [facility] would no longer be able to provide opioid refills moving forward and a pain clinic referral was placed. She declined lyrica, gabapentin, and cymbalta at that visit. Today she is agreeable to trial of gabapentin.  Will send in gabapentin 100 mg TID with ability to titrate dose for adequate symptom relief.  Champ Mungo, DO

## 2023-02-18 DIAGNOSIS — S92322A Displaced fracture of second metatarsal bone, left foot, initial encounter for closed fracture: Secondary | ICD-10-CM | POA: Diagnosis not present

## 2023-02-20 ENCOUNTER — Telehealth: Payer: Self-pay | Admitting: *Deleted

## 2023-02-20 ENCOUNTER — Telehealth: Payer: Self-pay

## 2023-02-20 ENCOUNTER — Other Ambulatory Visit: Payer: Self-pay | Admitting: Physician Assistant

## 2023-02-20 ENCOUNTER — Other Ambulatory Visit (HOSPITAL_COMMUNITY): Payer: Self-pay

## 2023-02-20 DIAGNOSIS — F331 Major depressive disorder, recurrent, moderate: Secondary | ICD-10-CM

## 2023-02-20 MED ORDER — LAMOTRIGINE 100 MG PO TABS: 100.0000 mg | ORAL_TABLET | Freq: Every day | ORAL | 0 refills | Status: DC

## 2023-02-20 NOTE — Telephone Encounter (Signed)
Pt called requesting an appt  stated that she was told by PCP  to make and appt with an resident  while she is still in the  office  .Lindsay [name] But  PCP is not  attending for the rest of the month that we have openings... I  did try to offer an appt  still but pt is  trying to follow PCP orders

## 2023-02-20 NOTE — Telephone Encounter (Signed)
Call from [place] [place] [place] [facility] called to report that patient refused treatment today.  Patient refused due to foot pain.  [facility] will attempt to PT next week or opt to await start after patient has her Orthopaedic appointment in February.

## 2023-02-20 NOTE — Progress Notes (Signed)
30 day supply of patients Lamictal was sent to the pharmacy. Patient called [facility] for a refill and was told that she was given a 90 day supply in November. Patient swears that it was not a 90 day. I called the pharmacy and was told that yes, they filled for 90 days per the patient request. However they did not fill the other 2 prescriptions that were sent the same day for 90 days. I asked if that was normal for a patient to request 90 days on only one medication. Pharmacist could not give an explanation. They said that they could do a lost prescription over ride if I sent in a new rx. I sent in a 30 day order and called patient to let her know

## 2023-02-21 ENCOUNTER — Ambulatory Visit (INDEPENDENT_AMBULATORY_CARE_PROVIDER_SITE_OTHER): Payer: 59 | Admitting: Licensed Clinical Social Worker

## 2023-02-21 DIAGNOSIS — F410 Panic disorder [episodic paroxysmal anxiety] without agoraphobia: Secondary | ICD-10-CM

## 2023-02-21 DIAGNOSIS — I10 Essential (primary) hypertension: Secondary | ICD-10-CM | POA: Diagnosis not present

## 2023-02-21 DIAGNOSIS — F411 Generalized anxiety disorder: Secondary | ICD-10-CM | POA: Diagnosis not present

## 2023-02-21 DIAGNOSIS — E782 Mixed hyperlipidemia: Secondary | ICD-10-CM | POA: Diagnosis not present

## 2023-02-21 DIAGNOSIS — F4312 Post-traumatic stress disorder, chronic: Secondary | ICD-10-CM

## 2023-02-21 DIAGNOSIS — F331 Major depressive disorder, recurrent, moderate: Secondary | ICD-10-CM | POA: Diagnosis not present

## 2023-02-21 DIAGNOSIS — E1169 Type 2 diabetes mellitus with other specified complication: Secondary | ICD-10-CM | POA: Diagnosis not present

## 2023-02-21 DIAGNOSIS — I5042 Chronic combined systolic (congestive) and diastolic (congestive) heart failure: Secondary | ICD-10-CM | POA: Diagnosis not present

## 2023-02-21 NOTE — Progress Notes (Signed)
Virtual Visit via Video Note  I connected with [name] on 02/21/23 at  8:00 AM EST by a video enabled telemedicine application and verified that I am speaking with the correct person using two identifiers.  Location: Patient: home Provider: home office   I discussed the limitations of evaluation and management by telemedicine and the availability of in person appointments. The patient expressed understanding and agreed to proceed.   I discussed the assessment and treatment plan with the patient. The patient was provided an opportunity to ask questions and all were answered. The patient agreed with the plan and demonstrated an understanding of the instructions.   The patient was advised to call back or seek an in-person evaluation if the symptoms worsen or if the condition fails to improve as anticipated.  I provided 39 minutes of non-face-to-face time during this encounter.  THERAPIST PROGRESS NOTE  Session Time: 8:00 AM to 8:39 AM  Participation Level: Active  Behavioral Response: CasualAlertappropriate  Type of Therapy: Individual Therapy  Treatment Goals addressed: patient continue to utilize therapy for  supportive and strength-based interventions, provide treatment interventions in the context of patient continuing to seek help and cope with medical issues, anxiety, triggers, coping with mental health symptoms, trauma, utilize therapy as a way for patient to focus on working through current stressors that also helps to distract from pain  ProgressTowards Goals: Progressing-therapist assesses patient moving forward with pain management as positive worked on panic today connection with trauma simple exercises to help deactivate panic looking more at coping with trauma symptoms will help with fight/flight  Interventions: CBT, Solution Focused, Strength-based, Supportive, and Other: coping  Summary: Mikayela Deats is a 53 y.o. female who presents with emergency orthopedic  appointment date waiting a month Dr. Now it is rescheduled to February 4 patient's primary care leaving who has been managing her care patient says cried about it. Therapist said don't blame her and understands you relationship with doctor is important particularly given her medical issues. Left message doesn't think anybody can treat her for pain doesn't know what she means by that. PCP schedule full see a resident patient doesn't want to see a resident. Before fell in pain management due to knee torn meniscus then fallen hurts worse than it was before. Broken knee broken toes and foot..Make appointment for resident while PCP supervising them. Leave a message so they know what to do. Has to call back to get appointment with resident. Speaking up for what needs not refusing care. Patient is the one sitting in pain even before she fell. Guesses what she said yesterday pending referral for pain. Had to pay for Lamictal out of pocket because of pharmacy screwed up meds.Lamictal relaxed and keep her balanced especially when doesn't have a panic attack pill. Noted therapy helps with life and what is going on with her every day. Dealing with things more panicky nightmares before going to PCP office told her leaving and patient cried. Panic in the moment couldn't reel herself in so much went down couldn't count the numbers. Couldn't contain her composure until when in grocery store. Reeling herself In the moment techniques don't work it is instant and can't stop it.  Therapist provided more education on panic (see below)  Patient updated therapist about her frustrations with the medical system therapist on ongoing basis validates patient when hears all the roadblocks in problematic situations such as her primary care leaving.  Therapist noted which she sees this time as patient really pushing for  the care sometimes she gets frustrated and does not want to do anything but we have been talking about pain for a long time  and she is really pushing to get her needs met which is helpful.  There may be a pain management referral in place patient has a emergency Ortho appointment although it was pushed out.  Therapist made the comment although it is hard to hear that her doctors leaving and she needs someone who knows what they are doing their other doctors who will be skilled to help her therapist shared her own experience that working with a doctor who got to know her and got better over time.  Therapist provided education on panic that it is fight/flight but it is harmless so deep breathing helps patient in the moment does not know how to do the exercise therapist that can be simple just take some deep breaths so I want to breathe so it is not in your chest but down lower beneath belly.  Grounding helps the situation as well to get out of her head noted fight/flight probably relates to trauma so relaxation and deep breathing help with that grounding helps therapist suggest we work more on skill building around coping with fight/flight for trauma and then see if we wanted take deeper and work on trauma activities.  Therapist provided more education when she got in the grocery store and calm down probably in her brain it was deactivation of her amygdala key strategy for emotional regulation is to slow down pauses so that her smart brain or prefrontal can take over therapist also noted for her as well getting to a new environment stepping away helps to calm down.  Assessed patient really needs the support dealing with some stressors and frustrations in someone who encourages her this therapist to move forward in a positive direction. Suicidal/Homicidal: No  Plan: Return again in 2 weeks.2.  Show patient "on hope look at complex trauma for coping skills for fight/flight  Diagnosis: Major depressive disorder, recurrent, moderate, generalized anxiety disorder, chronic PTSD, panic attacks   Collaboration of Care: Other none  needed  Patient/Guardian was advised Release of Information must be obtained prior to any record release in order to collaborate their care with an outside provider. Patient/Guardian was advised if they have not already done so to contact the registration department to sign all necessary forms in order for [name] to release information regarding their care.   Consent: Patient/Guardian gives verbal consent for treatment and assignment of benefits for services provided during this visit. Patient/Guardian expressed understanding and agreed to proceed.   Coolidge Breeze, LCSW 02/21/2023

## 2023-02-25 ENCOUNTER — Other Ambulatory Visit: Payer: Self-pay

## 2023-02-25 ENCOUNTER — Ambulatory Visit (HOSPITAL_COMMUNITY)
Admission: RE | Admit: 2023-02-25 | Discharge: 2023-02-25 | Disposition: A | Discharge status: Home / Self Care | Payer: 59 | Source: Ambulatory Visit | Attending: Internal Medicine | Admitting: Internal Medicine

## 2023-02-25 ENCOUNTER — Emergency Department (HOSPITAL_COMMUNITY): Payer: 59

## 2023-02-25 ENCOUNTER — Emergency Department (HOSPITAL_COMMUNITY)
Admission: EM | Admit: 2023-02-25 | Discharge: 2023-02-25 | Disposition: A | Discharge status: Home / Self Care | Payer: 59

## 2023-02-25 ENCOUNTER — Encounter: Payer: Self-pay | Admitting: Internal Medicine

## 2023-02-25 ENCOUNTER — Encounter (HOSPITAL_COMMUNITY): Payer: Self-pay

## 2023-02-25 DIAGNOSIS — S92302D Fracture of unspecified metatarsal bone(s), left foot, subsequent encounter for fracture with routine healing: Secondary | ICD-10-CM | POA: Insufficient documentation

## 2023-02-25 DIAGNOSIS — M85872 Other specified disorders of bone density and structure, left ankle and foot: Secondary | ICD-10-CM | POA: Diagnosis not present

## 2023-02-25 DIAGNOSIS — R0602 Shortness of breath: Secondary | ICD-10-CM | POA: Diagnosis not present

## 2023-02-25 DIAGNOSIS — M19071 Primary osteoarthritis, right ankle and foot: Secondary | ICD-10-CM | POA: Diagnosis not present

## 2023-02-25 DIAGNOSIS — S92322D Displaced fracture of second metatarsal bone, left foot, subsequent encounter for fracture with routine healing: Secondary | ICD-10-CM | POA: Diagnosis not present

## 2023-02-25 DIAGNOSIS — R079 Chest pain, unspecified: Secondary | ICD-10-CM | POA: Diagnosis not present

## 2023-02-25 DIAGNOSIS — M19072 Primary osteoarthritis, left ankle and foot: Secondary | ICD-10-CM | POA: Diagnosis not present

## 2023-02-25 DIAGNOSIS — R0789 Other chest pain: Secondary | ICD-10-CM | POA: Diagnosis not present

## 2023-02-25 DIAGNOSIS — M79672 Pain in left foot: Secondary | ICD-10-CM | POA: Diagnosis not present

## 2023-02-25 DIAGNOSIS — S99921A Unspecified injury of right foot, initial encounter: Secondary | ICD-10-CM | POA: Diagnosis not present

## 2023-02-25 DIAGNOSIS — R519 Headache, unspecified: Secondary | ICD-10-CM | POA: Insufficient documentation

## 2023-02-25 DIAGNOSIS — M7752 Other enthesopathy of left foot: Secondary | ICD-10-CM | POA: Diagnosis not present

## 2023-02-25 DIAGNOSIS — M79671 Pain in right foot: Secondary | ICD-10-CM | POA: Insufficient documentation

## 2023-02-25 DIAGNOSIS — M85871 Other specified disorders of bone density and structure, right ankle and foot: Secondary | ICD-10-CM | POA: Diagnosis not present

## 2023-02-25 LAB — BASIC METABOLIC PANEL
Anion gap: 14 (ref 5–15)
BUN: 10 mg/dL (ref 6–20)
CO2: 25 mmol/L (ref 22–32)
Calcium: 8.9 mg/dL (ref 8.9–10.3)
Chloride: 107 mmol/L (ref 98–111)
Creatinine, Ser: 1.11 mg/dL — ABNORMAL HIGH (ref 0.44–1.00)
GFR, Estimated: 59 mL/min — ABNORMAL LOW (ref >60)
Glucose, Bld: 95 mg/dL (ref 70–99)
Potassium: 4.2 mmol/L (ref 3.5–5.1)
Sodium: 146 mmol/L — ABNORMAL HIGH (ref 135–145)

## 2023-02-25 LAB — CBC
HCT: 33.8 % — ABNORMAL LOW (ref 36.0–46.0)
Hemoglobin: 10.4 g/dL — ABNORMAL LOW (ref 12.0–15.0)
MCH: 26.7 pg (ref 26.0–34.0)
MCHC: 30.8 g/dL (ref 30.0–36.0)
MCV: 86.7 fL (ref 80.0–100.0)
Platelets: 430 10*3/uL — ABNORMAL HIGH (ref 150–400)
RBC: 3.9 MIL/uL (ref 3.87–5.11)
RDW: 17.2 % — ABNORMAL HIGH (ref 11.5–15.5)
WBC: 8.2 10*3/uL (ref 4.0–10.5)
nRBC: 0 % (ref 0.0–0.2)

## 2023-02-25 LAB — HCG, SERUM, QUALITATIVE: Preg, Serum: NEGATIVE

## 2023-02-25 LAB — TROPONIN I (HIGH SENSITIVITY)
Troponin I (High Sensitivity): 4 ng/L (ref <18)
Troponin I (High Sensitivity): 4 ng/L (ref <18)

## 2023-02-25 MED ORDER — METOCLOPRAMIDE HCL 10 MG PO TABS
10.0000 mg | ORAL_TABLET | Freq: Once | ORAL | Status: AC
  Administered 2023-02-25: 10 mg via ORAL
  Filled 2023-02-25: qty 1

## 2023-02-25 MED ORDER — OXYCODONE-ACETAMINOPHEN 5-325 MG PO TABS
1.0000 | ORAL_TABLET | Freq: Once | ORAL | Status: AC
  Administered 2023-02-25: 1 via ORAL
  Filled 2023-02-25: qty 1

## 2023-02-25 NOTE — ED Provider Triage Note (Signed)
Emergency Medicine Provider Triage Evaluation Note  Lindsay [name] , a 53 y.o. female  was evaluated in triage.  Pt complains of R sided chest pain. Was at radiology getting follow up imaging after L metatarsal fractures from 12/15. Started having right sided CP last night, does not radiate. Developed headache before hospital arrival, describes pain behind her eyes, no vision changes. No swelling in the legs, but persistent foot pain from injury. No cough, some SOB, on 3 L [place] chronically for OSA/chronic respiratory failure. No hx of blood clots, not on blood thinners.   Review of Systems  Positive: CP, SOB, foot pain, headache Negative: Leg pain/swelling, cough, fever, vision changes  Physical Exam  BP 128/85 (BP Location: Left Arm)   Pulse 97   Temp 99 F (37.2 C)   Resp 19   SpO2 98%  Gen:   Awake, no distress   Resp:  Normal effort, not wearing home oxygen MSK:   Moves extremities without difficulty  Other:  L foot in CAM walker boot, no calf tenderness or swelling  Medical Decision Making  Medically screening exam initiated at 10:57 AM.  Appropriate orders placed.  [name] was informed that the remainder of the evaluation will be completed by another provider, this initial triage assessment does not replace that evaluation, and the importance of remaining in the ED until their evaluation is complete.  Workup initiated   Lakiya Cottam T, PA-C 02/25/23 1058

## 2023-02-25 NOTE — ED Triage Notes (Addendum)
Pt arrives via POV. Pt reports chest discomfort since last night that feels like a pulled muscle and a headache that started this morning. Pt also states she was here to get an xray of her left foot prior to checking into the ED, states xray was completed.

## 2023-02-25 NOTE — Discharge Instructions (Signed)
Please follow-up with your primary doctors.  Return immediately felt fevers, chills, worsening headache, vision changes, facial droop, unilateral weakness, worsening chest pain, shortness of breath, lightheadedness, passout or develop any new or worsening symptoms that are concerning to you.

## 2023-02-25 NOTE — ED Provider Notes (Signed)
[place] EMERGENCY DEPARTMENT AT [facility] Provider Note   CSN: [id number] Arrival date & time: 02/25/23  4098     History  No chief complaint on file.   Lindsay [name] is a 53 y.o. female.  Is a 53 year old female presenting emergency department for chest pain.  Started last night.  Constant.  Sharp.  Worse with movement.  States it started after she reached over to grab her phone.  Also notes that she has headache, gradual onset today.  History of headaches as well.  No neck stiffness, fevers.  Complaining of foot pain, does have metatarsal fracture.        Home Medications Prior to Admission medications   Medication Sig Start Date End Date Taking? Authorizing Provider  ACCU-CHEK GUIDE test strip USE ONE TEST STRIP TO CHECK BLOOD SUGARS ONCE A DAY AS NEEDED Patient not taking: Reported on 01/12/2023 10/16/22   [name], MD  Accu-Chek Softclix Lancets lancets USE ONE LANCET TO CHECK BLOOD SUGAR ONE A DAY AS NEEDED Patient not taking: Reported on 01/12/2023 07/03/22   [name], MD  acetaminophen (TYLENOL) 500 MG tablet Take 1 tablet (500 mg total) by mouth every 6 (six) hours as needed. Patient not taking: Reported on 12/03/2022 09/17/22   Derwood Kaplan, MD  albuterol (PROVENTIL) (2.5 MG/3ML) 0.083% nebulizer solution Take 3 mLs (2.5 mg total) by nebulization every 6 (six) hours as needed for wheezing or shortness of breath. 11/06/22   [name], MD  albuterol (VENTOLIN HFA) 108 (90 Base) MCG/ACT inhaler TAKE 2 PUFFS BY MOUTH EVERY 6 HOURS AS NEEDED FOR WHEEZE OR SHORTNESS OF BREATH 05/20/22   Olalere, Adewale A, MD  atorvastatin (LIPITOR) 40 MG tablet TAKE 1 TABLET BY MOUTH EVERY DAY 04/30/22   [name], MD  Azelastine HCl 137 MCG/SPRAY SOLN PLACE 1 SPRAY INTO BOTH NOSTRILS 2 (TWO) TIMES DAILY. USE IN EACH NOSTRIL AS DIRECTED 01/17/23   [name], MD  Blood Glucose Monitoring Suppl (ACCU-CHEK GUIDE ME) w/Device KIT Dispense one  device 02/16/20   [name], MD  Brexpiprazole (REXULTI) 3 MG TABS Take 1 tablet (3 mg total) by mouth daily. 12/24/22   [name], Phillips Grout, MD  cetirizine (ZYRTEC) 10 MG tablet Take 1 tablet (10 mg total) by mouth daily. 08/05/22   [name], MD  dicyclomine (BENTYL) 10 MG capsule Take 1 capsule (10 mg total) by mouth 3 (three) times daily as needed for spasms. 02/12/23   [name], MD  doxepin (SINEQUAN) 25 MG capsule Take 1 capsule (25 mg total) by mouth at bedtime. 12/24/22   [name], Phillips Grout, MD  ENTRESTO 24-26 MG TAKE 1 TABLET BY MOUTH TWICE A DAY 02/10/23   [name], MD  fluticasone [facility]) 50 MCG/ACT nasal spray SPRAY 1 SPRAY INTO EACH NOSTRIL DAILY 09/24/22   [name], MD  gabapentin (NEURONTIN) 100 MG capsule Take 1 capsule (100 mg total) by mouth 3 (three) times daily. 02/17/23 02/17/24  Champ Mungo, DO  ibuprofen (ADVIL) 200 MG tablet Take 2 tablets (400 mg total) by mouth every 8 (eight) hours as needed for moderate pain (pain score 4-6). 01/20/23   Tyson Alias, MD  ipratropium (ATROVENT) 0.06 % nasal spray PLEASE SEE ATTACHED FOR DETAILED DIRECTIONS 04/14/20   [provider]  lamoTRIgine (LAMICTAL) 100 MG tablet Take 1 tablet (100 mg total) by mouth daily. 02/20/23   [name], Phillips Grout, MD  linaclotide (LINZESS) 290 MCG CAPS capsule Take 1 capsule (290 mcg total) by mouth daily  before breakfast. 12/03/22 03/03/23  Doree Albee, PA-C  megestrol (MEGACE) 40 MG tablet Take 2 tablets (80 mg total) by mouth daily. 06/10/22   Reva Bores, MD  metoprolol succinate (TOPROL-XL) 50 MG 24 hr tablet Take 1 tablet (50 mg total) by mouth daily. 05/23/21   [name], MD  omeprazole (PRILOSEC) 40 MG capsule TAKE 1 CAPSULE (40 MG TOTAL) BY MOUTH DAILY. 01/17/23   Quentin Mulling R, PA-C  ondansetron (ZOFRAN-ODT) 8 MG disintegrating tablet TAKE 1 TABLET BY MOUTH EVERY 8 HOURS AS NEEDED 02/19/23   [name], MD  promethazine (PHENERGAN) 25 MG  tablet TAKE 1 TABLET BY MOUTH EVERY 6 HOURS AS NEEDED FOR NAUSEA OR VOMITING. 02/20/23   Quentin Mulling R, PA-C  Semaglutide, 2 MG/DOSE, (OZEMPIC, 2 MG/DOSE,) 8 MG/3ML SOPN Inject 2 mg into the skin once a week. 08/05/22   [name], MD  SUMAtriptan (IMITREX) 50 MG tablet Take 1 tablet (50 mg total) by mouth every 2 (two) hours as needed for up to 10 doses for migraine. May repeat in 2 hours if headache persists or recurs. Patient not taking: Reported on 12/03/2022 08/15/22   [name], MD  tiZANidine (ZANAFLEX) 4 MG tablet Take 1 tablet (4 mg total) by mouth every 8 (eight) hours as needed for muscle spasms. 09/04/22   [name], MD  torsemide (DEMADEX) 20 MG tablet TAKE 1 TABLET BY MOUTH TWICE A DAY 01/27/23   [name], MD      Allergies    Lisinopril    Review of Systems   Review of Systems  Physical Exam Updated Vital Signs BP (!) 138/97 (BP Location: Left Arm)   Pulse 94   Temp 99.2 F (37.3 C) (Oral)   Resp 18   SpO2 95%  Physical Exam Vitals and nursing note reviewed.  Constitutional:      General: She is not in acute distress.    Appearance: She is obese. She is not toxic-appearing.  HENT:     Head: Normocephalic.     Nose: Nose normal.     Mouth/Throat:     Mouth: Mucous membranes are moist.  Eyes:     Conjunctiva/sclera: Conjunctivae normal.  Cardiovascular:     Rate and Rhythm: Normal rate and regular rhythm.  Pulmonary:     Effort: Pulmonary effort is normal.     Breath sounds: Normal breath sounds.  Abdominal:     General: Abdomen is flat. There is no distension.     Palpations: Abdomen is soft.     Tenderness: There is no abdominal tenderness. There is no guarding.  Musculoskeletal:        General: Normal range of motion.     Cervical back: Normal range of motion.  Skin:    General: Skin is warm.     Capillary Refill: Capillary refill takes less than 2 seconds.  Neurological:     General: No focal deficit present.     Mental  Status: She is alert.  Psychiatric:        Mood and Affect: Mood normal.        Behavior: Behavior normal.     ED Results / Procedures / Treatments   Labs (all labs ordered are listed, but only abnormal results are displayed) Labs Reviewed  BASIC METABOLIC PANEL - Abnormal; Notable for the following components:      Result Value   Sodium 146 (*)    Creatinine, Ser 1.11 (*)    GFR, Estimated 59 (*)  All other components within normal limits  CBC - Abnormal; Notable for the following components:   Hemoglobin 10.4 (*)    HCT 33.8 (*)    RDW 17.2 (*)    Platelets 430 (*)    All other components within normal limits  HCG, SERUM, QUALITATIVE  TROPONIN I (HIGH SENSITIVITY)  TROPONIN I (HIGH SENSITIVITY)    EKG None  Radiology DG Chest 2 View Result Date: 02/25/2023 CLINICAL DATA:  Right side chest pain, shortness of breath. EXAM: CHEST - 2 VIEW COMPARISON:  01/12/2023 FINDINGS: Hazy attenuation again noted over the lung bases felt to reflect soft tissue attenuation. No definite confluent airspace opacity or effusion. Heart mediastinal contours are within normal limits. No acute bony abnormality. IMPRESSION: No acute cardiopulmonary disease. Electronically Signed   By: Charlett Nose M.D.   On: 02/25/2023 11:51    Procedures Procedures    Medications Ordered in ED Medications  oxyCODONE-acetaminophen (PERCOCET/ROXICET) 5-325 MG per tablet 1 tablet (1 tablet Oral Given 02/25/23 1744)  metoCLOPramide (REGLAN) tablet 10 mg (10 mg Oral Given 02/25/23 1744)    ED Course/ Medical Decision Making/ A&P Clinical Course as of 02/25/23 1752  Tue Feb 25, 2023  1641 CTA chest per chart review on 01/12/23: IMPRESSION: 1. Suboptimal timing of the contrast bolus with satisfactory opacification of the pulmonary arteries to the lobar level. No convincing evidence of pulmonary embolism. 2. Patchy peripheral opacities in the basilar left lower lobe may be of infectious or inflammatory  etiology. 3. Unchanged 5 mm subpleural nodular opacity in the lateral aspect of the superior left lower lobe segment. 4. Enlarged main pulmonary artery, suggestive of pulmonary hypertension.  " [TY]  1641 Basic metabolic panel(!) No significant electrolyte abnormalities.  Minor elevation in creatinine, but not consistent with AKI [TY]  1642 Troponin I (High Sensitivity): 4 ACS unlikely [TY]  1642 CBC(!) Stable anemia. [TY]    Clinical Course User Index [TY] Coral Spikes, DO                                 Medical Decision Making Is a 53 year old female present emergency department for chest pain.  She is afebrile nontachycardic hemodynamically stable.  Physical exam reassuring, regular rate, equal pulses.  Lung sounds clear.  No localizing neurodeficits.  No red flags for her headache.  Chest pain atypical.  EKG without ST segment changes to indicate ischemia.  Troponin negative.  Other workup as noted in ED course.  History not consistent with PE; also had CTA last month.  Given Percocet for her foot pain, chest pain which I suspect is MSK and her headache.  Also given Reglan.  I do not feel the patient would benefit from acute hospitalization at this time, but can follow-up outpatient with her primary doctors/cardiology  Amount and/or Complexity of Data Reviewed External Data Reviewed:     Details: Most recent echo : MPRESSIONS     1. Left ventricular ejection fraction, by estimation, is 50%. The left  ventricle has mildly decreased function. The left ventricle demonstrates  global hypokinesis. The left ventricular internal cavity size was mildly  dilated. Left ventricular diastolic  parameters are consistent with Grade I diastolic dysfunction (impaired  relaxation).   2. Right ventricular systolic function is mildly reduced. The right  ventricular size is mildly enlarged. Tricuspid regurgitation signal is  inadequate for assessing PA pressure.   3. Right atrial size was  mildly dilated.  4. The mitral valve is normal in structure. No evidence of mitral valve  regurgitation. No evidence of mitral stenosis.   5. The aortic valve is tricuspid. Aortic valve regurgitation is not  visualized. No aortic stenosis is present.   6. The inferior vena cava is dilated in size with <50% respiratory  variability, suggesting right atrial pressure of 15 mmHg.  " Labs:  Decision-making details documented in ED Course.  Risk Prescription drug management.         Final Clinical Impression(s) / ED Diagnoses Final diagnoses:  None    Rx / DC Orders ED Discharge Orders     None         Coral Spikes, DO 02/25/23 1752

## 2023-02-26 ENCOUNTER — Telehealth: Payer: Self-pay

## 2023-02-26 DIAGNOSIS — Z0001 Encounter for general adult medical examination with abnormal findings: Secondary | ICD-10-CM | POA: Diagnosis not present

## 2023-02-26 DIAGNOSIS — G4733 Obstructive sleep apnea (adult) (pediatric): Secondary | ICD-10-CM | POA: Diagnosis not present

## 2023-02-26 DIAGNOSIS — I5022 Chronic systolic (congestive) heart failure: Secondary | ICD-10-CM | POA: Diagnosis not present

## 2023-02-26 DIAGNOSIS — I10 Essential (primary) hypertension: Secondary | ICD-10-CM | POA: Diagnosis not present

## 2023-02-26 DIAGNOSIS — G894 Chronic pain syndrome: Secondary | ICD-10-CM | POA: Diagnosis not present

## 2023-02-26 DIAGNOSIS — I110 Hypertensive heart disease with heart failure: Secondary | ICD-10-CM | POA: Diagnosis not present

## 2023-02-26 DIAGNOSIS — G43009 Migraine without aura, not intractable, without status migrainosus: Secondary | ICD-10-CM | POA: Diagnosis not present

## 2023-02-26 DIAGNOSIS — E1165 Type 2 diabetes mellitus with hyperglycemia: Secondary | ICD-10-CM | POA: Diagnosis not present

## 2023-02-26 NOTE — Transitions of Care (Post Inpatient/ED Visit) (Signed)
   02/26/2023  Name: Lindsay [name] MRN: [id number] DOB: 1970/10/28  Today's TOC FU Call Status: Today's TOC FU Call Status:: Unsuccessful Call (1st Attempt) Unsuccessful Call (1st Attempt) Date: 02/26/23  Attempted to reach the patient regarding the most recent Inpatient/ED visit.  Follow Up Plan: Additional outreach attempts will be made to reach the patient to complete the Transitions of Care (Post Inpatient/ED visit) call.   Signature Karena Addison, LPN [facility] Nurse Health Advisor Direct Dial [phone]

## 2023-02-27 ENCOUNTER — Telehealth: Payer: Self-pay | Admitting: *Deleted

## 2023-02-27 ENCOUNTER — Other Ambulatory Visit: Payer: Self-pay | Admitting: Internal Medicine

## 2023-02-27 DIAGNOSIS — K219 Gastro-esophageal reflux disease without esophagitis: Secondary | ICD-10-CM

## 2023-02-27 NOTE — Telephone Encounter (Addendum)
Call from [place] PT assistance with [facility] stated pt missed her PT visit this week; pt she wanted to wait until she sees the doctor.

## 2023-02-27 NOTE — Telephone Encounter (Signed)
Medication discontinued on 01/13/2023

## 2023-03-04 ENCOUNTER — Other Ambulatory Visit: Payer: Self-pay | Admitting: Internal Medicine

## 2023-03-04 DIAGNOSIS — K219 Gastro-esophageal reflux disease without esophagitis: Secondary | ICD-10-CM

## 2023-03-04 DIAGNOSIS — S92322A Displaced fracture of second metatarsal bone, left foot, initial encounter for closed fracture: Secondary | ICD-10-CM | POA: Diagnosis not present

## 2023-03-04 DIAGNOSIS — S92332A Displaced fracture of third metatarsal bone, left foot, initial encounter for closed fracture: Secondary | ICD-10-CM | POA: Diagnosis not present

## 2023-03-04 DIAGNOSIS — S92342A Displaced fracture of fourth metatarsal bone, left foot, initial encounter for closed fracture: Secondary | ICD-10-CM | POA: Diagnosis not present

## 2023-03-04 DIAGNOSIS — S92352A Displaced fracture of fifth metatarsal bone, left foot, initial encounter for closed fracture: Secondary | ICD-10-CM | POA: Diagnosis not present

## 2023-03-04 NOTE — Transitions of Care (Post Inpatient/ED Visit) (Signed)
 03/04/2023  Name: Lindsay [name] MRN: [id number] DOB: 1970/09/15  Today's TOC FU Call Status: Today's TOC FU Call Status:: Successful TOC FU Call Completed Unsuccessful Call (1st Attempt) Date: 02/26/23 [facility] FU Call Complete Date: 03/04/23 Patient's Name and Date of Birth confirmed.  Transition Care Management Follow-up Telephone Call Date of Discharge: 02/25/23 Discharge Facility: [facility]) Type of Discharge: Emergency Department Reason for ED Visit: Other: (chest pain) How have you been since you were released from the hospital?: Better Any questions or concerns?: No  Items Reviewed: Did you receive and understand the discharge instructions provided?: Yes Medications obtained,verified, and reconciled?: Yes (Medications Reviewed) Any new allergies since your discharge?: No Dietary orders reviewed?: Yes Do you have support at home?: No  Medications Reviewed Today: Medications Reviewed Today     Reviewed by Emmitt Pan, LPN (Licensed Practical Nurse) on 03/04/23 at 1415  Med List Status: <None>   Medication Order Taking? Sig Documenting Provider Last Dose Status Informant  ACCU-CHEK GUIDE test strip [id number] No USE ONE TEST STRIP TO CHECK BLOOD SUGARS ONCE A DAY AS NEEDED  Patient not taking: Reported on 01/12/2023   Forest Coy, MD Not Taking Active Self  Accu-Chek Softclix Lancets lancets [id number] No USE ONE LANCET TO CHECK BLOOD SUGAR ONE A DAY AS NEEDED  Patient not taking: Reported on 01/12/2023   Guilloud, Carolyn, MD Not Taking Active Self  acetaminophen  (TYLENOL ) 500 MG tablet [id number] No Take 1 tablet (500 mg total) by mouth every 6 (six) hours as needed.  Patient not taking: Reported on 12/03/2022   Charlyn Sora, MD Not Taking Active Self  albuterol  (PROVENTIL ) (2.5 MG/3ML) 0.083% nebulizer solution [id number] No Take 3 mLs (2.5 mg total) by nebulization every 6 (six) hours as needed for wheezing or shortness of breath. Guilloud, Carolyn, MD Past  Week Active Self  albuterol  (VENTOLIN  HFA) 108 (90 Base) MCG/ACT inhaler [id number] No TAKE 2 PUFFS BY MOUTH EVERY 6 HOURS AS NEEDED FOR WHEEZE OR SHORTNESS OF BREATH Olalere, Adewale A, MD Past Week Active Self  atorvastatin  (LIPITOR) 40 MG tablet [id number] No TAKE 1 TABLET BY MOUTH EVERY DAY Guilloud, Carolyn, MD 01/12/2023 Active Self  Azelastine  HCl 137 MCG/SPRAY SOLN [id number]  PLACE 1 SPRAY INTO BOTH NOSTRILS 2 (TWO) TIMES DAILY. USE IN EACH NOSTRIL AS DIRECTED Forest Coy, MD  Active   Blood Glucose Monitoring Suppl (ACCU-CHEK GUIDE ME) w/Device KIT [id number] No Dispense one device Forest Coy, MD Past Week Active Self  Brexpiprazole  (REXULTI ) 3 MG TABS [id number] No Take 1 tablet (3 mg total) by mouth daily. [name], Syed T, MD 01/12/2023 Active Self  cetirizine  (ZYRTEC ) 10 MG tablet [id number] No Take 1 tablet (10 mg total) by mouth daily. Guilloud, Carolyn, MD Past Week Active Self  dicyclomine  (BENTYL ) 10 MG capsule [id number]  Take 1 capsule (10 mg total) by mouth 3 (three) times daily as needed for spasms. Guilloud, Carolyn, MD  Active   doxepin  (SINEQUAN ) 25 MG capsule [id number] No Take 1 capsule (25 mg total) by mouth at bedtime. [name], Syed T, MD 01/12/2023 Active Self  ENTRESTO  24-26 MG [id number]  TAKE 1 TABLET BY MOUTH TWICE A DAY Guilloud, Carolyn, MD  Active   fluticasone  (FLONASE ) 50 MCG/ACT nasal spray [id number] No SPRAY 1 SPRAY INTO EACH NOSTRIL DAILY Guilloud, Carolyn, MD Past Month Active Self  gabapentin  (NEURONTIN ) 100 MG capsule [id number]  Take 1 capsule (100 mg total) by mouth 3 (three) times daily. Addie Perkins, DO  Active   ibuprofen  (ADVIL ) 200 MG  tablet [id number]  Take 2 tablets (400 mg total) by mouth every 8 (eight) hours as needed for moderate pain (pain score 4-6). Jerrell Cleatus Ned, MD  Active   ipratropium (ATROVENT ) 0.06 % nasal spray [id number] No PLEASE SEE ATTACHED FOR DETAILED DIRECTIONS [provider] Past Week Active Self  lamoTRIgine   (LAMICTAL ) 100 MG tablet [id number]  Take 1 tablet (100 mg total) by mouth daily. Curry Leni DASEN, MD  Active   linaclotide  (LINZESS ) 290 MCG CAPS capsule [id number] No Take 1 capsule (290 mcg total) by mouth daily before breakfast. Craig Alan SAUNDERS, PA-C 01/12/2023 Expired 03/03/23 2359 Self  megestrol  (MEGACE ) 40 MG tablet [id number] No Take 2 tablets (80 mg total) by mouth daily. Fredirick Glenys RAMAN, MD 01/11/2023 Active Self  metoprolol  succinate (TOPROL -XL) 50 MG 24 hr tablet [id number] No Take 1 tablet (50 mg total) by mouth daily. Guilloud, Carolyn, MD 01/12/2023 Active Self  omeprazole  (PRILOSEC) 40 MG capsule [id number]  TAKE 1 CAPSULE (40 MG TOTAL) BY MOUTH DAILY. Craig Alan SAUNDERS, PA-C  Active   ondansetron  (ZOFRAN -ODT) 8 MG disintegrating tablet [id number]  TAKE 1 TABLET BY MOUTH EVERY 8 HOURS AS NEEDED Guilloud, Carolyn, MD  Active   promethazine  (PHENERGAN ) 25 MG tablet [id number]  TAKE 1 TABLET BY MOUTH EVERY 6 HOURS AS NEEDED FOR NAUSEA OR VOMITING. Craig Alan SAUNDERS, PA-C  Active   Semaglutide , 2 MG/DOSE, (OZEMPIC , 2 MG/DOSE,) 8 MG/3ML SOPN [id number] No Inject 2 mg into the skin once a week. Guilloud, Carolyn, MD Past Week Active Self  SUMAtriptan  (IMITREX ) 50 MG tablet [id number] No Take 1 tablet (50 mg total) by mouth every 2 (two) hours as needed for up to 10 doses for migraine. May repeat in 2 hours if headache persists or recurs.  Patient not taking: Reported on 12/03/2022   Forest Coy, MD Not Taking Active Self  tiZANidine  (ZANAFLEX ) 4 MG tablet [id number] No Take 1 tablet (4 mg total) by mouth every 8 (eight) hours as needed for muscle spasms. Guilloud, Carolyn, MD 01/12/2023 Active Self  torsemide  (DEMADEX ) 20 MG tablet [id number]  TAKE 1 TABLET BY MOUTH TWICE A DAY Guilloud, Carolyn, MD  Active             Home Care and Equipment/Supplies: Were Home Health Services Ordered?: NA Any new equipment or medical supplies ordered?: NA  Functional Questionnaire: Do you need  assistance with bathing/showering or dressing?: No Do you need assistance with meal preparation?: No Do you need assistance with eating?: No Do you have difficulty maintaining continence: No Do you need assistance with getting out of bed/getting out of a chair/moving?: No Do you have difficulty managing or taking your medications?: No  Follow up appointments reviewed: PCP Follow-up appointment confirmed?: NA Specialist Hospital Follow-up appointment confirmed?: Yes Date of Specialist follow-up appointment?: 02/28/23 Follow-Up Specialty Provider:: cardio Do you need transportation to your follow-up appointment?: No Do you understand care options if your condition(s) worsen?: Yes-patient verbalized understanding    SIGNATURE Julian Lemmings, LPN [facility] Nurse Health Advisor Direct Dial [phone]

## 2023-03-04 NOTE — Telephone Encounter (Signed)
Famotidine discontinued on 01/13/2023 Naproxen discontinued on 07/19/22 Refill request not appropriate.

## 2023-03-07 ENCOUNTER — Ambulatory Visit (INDEPENDENT_AMBULATORY_CARE_PROVIDER_SITE_OTHER): Payer: 59 | Admitting: Licensed Clinical Social Worker

## 2023-03-07 ENCOUNTER — Encounter: Payer: 59 | Admitting: Internal Medicine

## 2023-03-07 DIAGNOSIS — F411 Generalized anxiety disorder: Secondary | ICD-10-CM | POA: Diagnosis not present

## 2023-03-07 DIAGNOSIS — F410 Panic disorder [episodic paroxysmal anxiety] without agoraphobia: Secondary | ICD-10-CM | POA: Diagnosis not present

## 2023-03-07 DIAGNOSIS — F331 Major depressive disorder, recurrent, moderate: Secondary | ICD-10-CM | POA: Diagnosis not present

## 2023-03-07 DIAGNOSIS — F4312 Post-traumatic stress disorder, chronic: Secondary | ICD-10-CM

## 2023-03-07 NOTE — Progress Notes (Signed)
 Virtual Visit via Video Note  I connected with Lindsay [name] on 03/07/23 at  8:00 AM EST by a video enabled telemedicine application and verified that I am speaking with the correct person using two identifiers.  Location: Patient: home Provider: home office   I discussed the limitations of evaluation and management by telemedicine and the availability of in person appointments. The patient expressed understanding and agreed to proceed.   I discussed the assessment and treatment plan with the patient. The patient was provided an opportunity to ask questions and all were answered. The patient agreed with the plan and demonstrated an understanding of the instructions.   The patient was advised to call back or seek an in-person evaluation if the symptoms worsen or if the condition fails to improve as anticipated.  I provided 40 minutes of non-face-to-face time during this encounter.  THERAPIST PROGRESS NOTE  Session Time: 8:00 AM to 8:40 AM  Participation Level: Active  Behavioral Response: CasualAlertfrustrated with ongoing pain issues appropriate in session  Type of Therapy: Individual Therapy  Treatment Goals addressed: patient continue to utilize therapy for  supportive and strength-based interventions, provide treatment interventions in the context of patient continuing to seek help and cope with medical issues, anxiety, triggers, coping with mental health symptoms, trauma, utilize therapy as a way for patient to focus on working through current stressors that also helps to distract from pain  ProgressTowards Goals: Progressing-patient experiencing significant stressors helpful to have an outlet to process as well as therapist provides supportive strength-based intervention  Interventions: Solution Focused, Strength-based, Supportive, and Other: Education on panic and trauma, coping  Summary: Lindsay [name] is a 53 y.o. female who presents with Blah therapist recognizes that  feeling. Went to emergency ortho wants to prescribe something use their pain specialist told it will be a year before heals. Therapist said finally somebody talking about helping and if she feels the way and patient doesn't feel that way. Every time get somewhere they act shitty guesses he will give the run around like everybody. Has a appointment with their pain specialist on Monday. Physical therapy start next week. Patient suggested it asked what the doctor thought and he said that would be great. The PA already said don't need it. Before didn't do physical therapy because didn't want to apply pressure so backed out of it. Toe is broke 2nd one to the end broke off from the foot. Knee hurts worse than before. Ask doctor to give a MRI and leave it at that with the knee. Therapist said moving patient said hopefully in the right direction. Barely bend the leg especially the left one in pain right now. Somebody from [place] going to ask what the aid does and how helps her they are the ones giving  her hours. How many hours they can give with her foot and toe needs help and knee worse than before. Feels the underlying stress always something going on. Tell Dr. [name] with therapy what makes sense to focus on is the here and now.   Patient updated therapist about latest developments with her appointments therapist is encouraged sees pain management on Monday a doctor who wants to prescribe her something in a projectory of a year to heal.  Patient still not enthusiastic used to getting the run around expects the same thing therapist said she cannot blame her for that that is why it has been but does sound from what patient reported to move forward.  Therapist reviewed education on panic  with people with trauma the medulla is overactive so more often to happen.  The work includes deactivating the amygdala with relaxation noted fighter flight responses and trauma include fear and anger and the way to cope is to noticed  it and look at it is data points to understand better.  Therapist talked about processing the memory of trauma restoring the memory is helpful for working as well and panic and trauma symptoms.  Asked patient if she would like to start with that at some point patient said learning about it is good enough for now.  Patient in pain so did not continue with education therapist interested in EMDR will learn more and practiced with patient when more skilled.  Therapist provided support and space for patient to talk about thoughts and feelings in session.  Therapist validated patient bringing up the topic there is always some stress patient agrees there is always something to be stressed about.  Therapist noted a facet of life one of the strongest skills as perseverance helps you get through the stress.  Suicidal/Homicidal: No  Plan: Return again in 2 weeks.2.  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 Patient/Guardian was advised if they have not already done so to contact the registration department to sign all necessary forms in order for us  to release information regarding their care.   Consent: Patient/Guardian gives verbal consent for treatment and assignment of benefits for services provided during this visit. Patient/Guardian expressed understanding and agreed to proceed.   Ronal Sink, LCSW 03/07/2023

## 2023-03-10 DIAGNOSIS — M79672 Pain in left foot: Secondary | ICD-10-CM | POA: Diagnosis not present

## 2023-03-12 ENCOUNTER — Other Ambulatory Visit: Payer: Self-pay | Admitting: Internal Medicine

## 2023-03-17 DIAGNOSIS — Z9981 Dependence on supplemental oxygen: Secondary | ICD-10-CM | POA: Diagnosis not present

## 2023-03-17 DIAGNOSIS — E119 Type 2 diabetes mellitus without complications: Secondary | ICD-10-CM | POA: Diagnosis not present

## 2023-03-17 DIAGNOSIS — I110 Hypertensive heart disease with heart failure: Secondary | ICD-10-CM | POA: Diagnosis not present

## 2023-03-17 DIAGNOSIS — S92355D Nondisplaced fracture of fifth metatarsal bone, left foot, subsequent encounter for fracture with routine healing: Secondary | ICD-10-CM | POA: Diagnosis not present

## 2023-03-17 DIAGNOSIS — G4733 Obstructive sleep apnea (adult) (pediatric): Secondary | ICD-10-CM | POA: Diagnosis not present

## 2023-03-17 DIAGNOSIS — I5020 Unspecified systolic (congestive) heart failure: Secondary | ICD-10-CM | POA: Diagnosis not present

## 2023-03-17 DIAGNOSIS — N049 Nephrotic syndrome with unspecified morphologic changes: Secondary | ICD-10-CM | POA: Diagnosis not present

## 2023-03-17 DIAGNOSIS — M419 Scoliosis, unspecified: Secondary | ICD-10-CM | POA: Diagnosis not present

## 2023-03-17 DIAGNOSIS — Z7985 Long-term (current) use of injectable non-insulin antidiabetic drugs: Secondary | ICD-10-CM | POA: Diagnosis not present

## 2023-03-17 DIAGNOSIS — E785 Hyperlipidemia, unspecified: Secondary | ICD-10-CM | POA: Diagnosis not present

## 2023-03-17 DIAGNOSIS — J9610 Chronic respiratory failure, unspecified whether with hypoxia or hypercapnia: Secondary | ICD-10-CM | POA: Diagnosis not present

## 2023-03-17 DIAGNOSIS — K219 Gastro-esophageal reflux disease without esophagitis: Secondary | ICD-10-CM | POA: Diagnosis not present

## 2023-03-17 DIAGNOSIS — J449 Chronic obstructive pulmonary disease, unspecified: Secondary | ICD-10-CM | POA: Diagnosis not present

## 2023-03-17 DIAGNOSIS — E559 Vitamin D deficiency, unspecified: Secondary | ICD-10-CM | POA: Diagnosis not present

## 2023-03-17 DIAGNOSIS — E611 Iron deficiency: Secondary | ICD-10-CM | POA: Diagnosis not present

## 2023-03-17 DIAGNOSIS — D649 Anemia, unspecified: Secondary | ICD-10-CM | POA: Diagnosis not present

## 2023-03-17 DIAGNOSIS — L732 Hidradenitis suppurativa: Secondary | ICD-10-CM | POA: Diagnosis not present

## 2023-03-17 DIAGNOSIS — M797 Fibromyalgia: Secondary | ICD-10-CM | POA: Diagnosis not present

## 2023-03-17 DIAGNOSIS — K589 Irritable bowel syndrome without diarrhea: Secondary | ICD-10-CM | POA: Diagnosis not present

## 2023-03-17 DIAGNOSIS — I959 Hypotension, unspecified: Secondary | ICD-10-CM | POA: Diagnosis not present

## 2023-03-17 DIAGNOSIS — G43909 Migraine, unspecified, not intractable, without status migrainosus: Secondary | ICD-10-CM | POA: Diagnosis not present

## 2023-03-20 ENCOUNTER — Other Ambulatory Visit: Payer: Self-pay | Admitting: Internal Medicine

## 2023-03-20 ENCOUNTER — Other Ambulatory Visit: Payer: Self-pay | Admitting: Physician Assistant

## 2023-03-20 DIAGNOSIS — M797 Fibromyalgia: Secondary | ICD-10-CM

## 2023-03-21 ENCOUNTER — Ambulatory Visit (HOSPITAL_COMMUNITY): Payer: 59 | Admitting: Licensed Clinical Social Worker

## 2023-03-21 DIAGNOSIS — F331 Major depressive disorder, recurrent, moderate: Secondary | ICD-10-CM

## 2023-03-21 DIAGNOSIS — F410 Panic disorder [episodic paroxysmal anxiety] without agoraphobia: Secondary | ICD-10-CM | POA: Diagnosis not present

## 2023-03-21 DIAGNOSIS — F411 Generalized anxiety disorder: Secondary | ICD-10-CM

## 2023-03-21 DIAGNOSIS — F4312 Post-traumatic stress disorder, chronic: Secondary | ICD-10-CM

## 2023-03-21 NOTE — Progress Notes (Signed)
Technological difficulties more than 50% of session spent on phone  Virtual Visit via Video Note  I connected with Lindsay [name] on 03/21/23 at  8:00 AM EST by a video enabled telemedicine application and verified that I am speaking with the correct person using two identifiers.  Location: Patient: home Provider: home office   I discussed the limitations of evaluation and management by telemedicine and the availability of in person appointments. The patient expressed understanding and agreed to proceed.   I discussed the assessment and treatment plan with the patient. The patient was provided an opportunity to ask questions and all were answered. The patient agreed with the plan and demonstrated an understanding of the instructions.   The patient was advised to call back or seek an in-person evaluation if the symptoms worsen or if the condition fails to improve as anticipated.  I provided 40 minutes of non-face-to-face time during this encounter.  THERAPIST PROGRESS NOTE  Session Time: 8:00 AM to 8:40 AM  Participation Level: Active  Behavioral Response: CasualAlertpatient appropriate but therapist can see his struggles because of pain  Type of Therapy: Individual Therapy  Treatment Goals addressed: patient continue to utilize therapy for  supportive and strength-based interventions, provide treatment interventions in the context of patient continuing to seek help and cope with medical issues, anxiety, triggers, coping with mental health symptoms, trauma, utilize therapy as a way for patient to focus on working through current stressors that also helps to distract from pain  ProgressTowards Goals: Progressing-patient continues to use therapy to work on treatment goals using it for supportive and strength-based interventions therapist utilizing interventions to help with medical issues processing thoughts and feelings a place to process feelings about triggers  Interventions: Solution  Focused, Strength-based, Supportive, and Other: coping  Summary: Lindsay [name] is a 53 y.o. female who presents with review of symptoms today patient says stiff and sore.  She went to pain specialist referred to by emergency orthopedic was told why was she there both patient and therapist relayed patient is just following doctors direction.  She does have small dose of pain meds given to her by orthopedic doctor therapist asked how it affects her she says makes pain palpable only a low-dose only can take 2 times a day still left hurting.  Will see specialist next month started physical therapy this Monday was told would help with healing and therapist agrees.  Patient supposed to see specialist for knee overall pain on March 7 and then pushed out the date patient is going to call to push it out as does not want to overlap to pain specialist at the same time wait till she addresses the pain with the foot. This test better day patient does not move around a lot reviewed pain foot left knee (they say torn meniscus) and back she is a little more comfortable with medication but left knee hurts badly does not do anything for that.  Therapist asked if she is bored during the day she stays busy with filling out forms phone calls.  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  Patient has an inspection on Monday so has to get ready so she will have to move around pick up things has to get to the store to get cleaning supplies will be getting help from other people but challenging for her as it is hard to move with her she is in  pain.  We talked about sleeping doctor does not want to give her an increase in sleep meds because of patient's low blood pressure when she fell when blood pressure was lowered.  Therapist asked about her sleep she says barely sleeps therapist could relate to struggles with sleep.  Looked at strategies patient says  reading helps her to sleep therapist noted this is positive patient explained now at church she will fall asleep listening to the sermon drop her Bible and embarrassing therapist laughed.  She goes to the beauty shop will fall asleep there to and therapist noted this probably is relaxing she knows she will fall asleep when she is sitting under hairdryer.  Therapist heard something thought was accurate getting to sleep involves being bored and at peace why looking at a devotional at night probably would be helpful patient will do that sometimes therapist noted practicing mindfulness being more aware what is going around you to get out of your head with therapist found that inevitably mind goes back to thoughts.  Therapist shared supposed to get up after 20 minutes if you cannot sleep patient did not know that.  Session was a good way to catch up therapist is important support for patient so assess therapeutic.  An upset for example her current medical care they do not know her one of the advantages of therapy is therapist does know patient really well and something patient appreciates.   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 Patient/Guardian was advised if they have not already done so to contact the registration department to sign all necessary forms in order for [name] to release information regarding their care.   Consent: Patient/Guardian gives verbal consent for treatment and assignment of benefits for services provided during this visit.  Patient/Guardian expressed understanding and agreed to proceed.   Coolidge Breeze, [place] 03/21/2023

## 2023-03-23 ENCOUNTER — Other Ambulatory Visit: Payer: Self-pay | Admitting: Internal Medicine

## 2023-03-23 DIAGNOSIS — A084 Viral intestinal infection, unspecified: Secondary | ICD-10-CM

## 2023-03-25 ENCOUNTER — Encounter (HOSPITAL_COMMUNITY): Payer: Self-pay | Admitting: Psychiatry

## 2023-03-25 ENCOUNTER — Telehealth (HOSPITAL_COMMUNITY): Payer: 59 | Admitting: Psychiatry

## 2023-03-25 VITALS — Wt 308.0 lb

## 2023-03-25 DIAGNOSIS — F411 Generalized anxiety disorder: Secondary | ICD-10-CM | POA: Diagnosis not present

## 2023-03-25 DIAGNOSIS — F331 Major depressive disorder, recurrent, moderate: Secondary | ICD-10-CM | POA: Diagnosis not present

## 2023-03-25 DIAGNOSIS — F4312 Post-traumatic stress disorder, chronic: Secondary | ICD-10-CM | POA: Diagnosis not present

## 2023-03-25 MED ORDER — DOXEPIN HCL 25 MG PO CAPS: 25.0000 mg | ORAL_CAPSULE | Freq: Every day | ORAL | 2 refills | Status: DC

## 2023-03-25 MED ORDER — REXULTI 3 MG PO TABS: 3.0000 mg | ORAL_TABLET | Freq: Every day | ORAL | 2 refills | Status: DC

## 2023-03-25 MED ORDER — LAMOTRIGINE 100 MG PO TABS: 50.0000 mg | ORAL_TABLET | Freq: Two times a day (BID) | ORAL | 2 refills | Status: DC
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
## 2023-03-26 MED ORDER — ONDANSETRON 8 MG PO TBDP: 8.0000 mg | ORAL_TABLET | Freq: Three times a day (TID) | ORAL | 0 refills | Status: DC | PRN

## 2023-03-28 ENCOUNTER — Telehealth: Payer: Self-pay

## 2023-03-28 ENCOUNTER — Ambulatory Visit (HOSPITAL_COMMUNITY): Payer: 59 | Admitting: Licensed Clinical Social Worker

## 2023-03-28 DIAGNOSIS — Z9981 Dependence on supplemental oxygen: Secondary | ICD-10-CM | POA: Diagnosis not present

## 2023-03-28 DIAGNOSIS — M419 Scoliosis, unspecified: Secondary | ICD-10-CM | POA: Diagnosis not present

## 2023-03-28 DIAGNOSIS — F410 Panic disorder [episodic paroxysmal anxiety] without agoraphobia: Secondary | ICD-10-CM | POA: Diagnosis not present

## 2023-03-28 DIAGNOSIS — J9610 Chronic respiratory failure, unspecified whether with hypoxia or hypercapnia: Secondary | ICD-10-CM | POA: Diagnosis not present

## 2023-03-28 DIAGNOSIS — I959 Hypotension, unspecified: Secondary | ICD-10-CM | POA: Diagnosis not present

## 2023-03-28 DIAGNOSIS — E559 Vitamin D deficiency, unspecified: Secondary | ICD-10-CM | POA: Diagnosis not present

## 2023-03-28 DIAGNOSIS — I110 Hypertensive heart disease with heart failure: Secondary | ICD-10-CM | POA: Diagnosis not present

## 2023-03-28 DIAGNOSIS — F331 Major depressive disorder, recurrent, moderate: Secondary | ICD-10-CM

## 2023-03-28 DIAGNOSIS — E611 Iron deficiency: Secondary | ICD-10-CM | POA: Diagnosis not present

## 2023-03-28 DIAGNOSIS — J449 Chronic obstructive pulmonary disease, unspecified: Secondary | ICD-10-CM | POA: Diagnosis not present

## 2023-03-28 DIAGNOSIS — F411 Generalized anxiety disorder: Secondary | ICD-10-CM | POA: Diagnosis not present

## 2023-03-28 DIAGNOSIS — G43909 Migraine, unspecified, not intractable, without status migrainosus: Secondary | ICD-10-CM | POA: Diagnosis not present

## 2023-03-28 DIAGNOSIS — N049 Nephrotic syndrome with unspecified morphologic changes: Secondary | ICD-10-CM | POA: Diagnosis not present

## 2023-03-28 DIAGNOSIS — S92355D Nondisplaced fracture of fifth metatarsal bone, left foot, subsequent encounter for fracture with routine healing: Secondary | ICD-10-CM | POA: Diagnosis not present

## 2023-03-28 DIAGNOSIS — E785 Hyperlipidemia, unspecified: Secondary | ICD-10-CM | POA: Diagnosis not present

## 2023-03-28 DIAGNOSIS — E119 Type 2 diabetes mellitus without complications: Secondary | ICD-10-CM | POA: Diagnosis not present

## 2023-03-28 DIAGNOSIS — F4312 Post-traumatic stress disorder, chronic: Secondary | ICD-10-CM | POA: Diagnosis not present

## 2023-03-28 DIAGNOSIS — K219 Gastro-esophageal reflux disease without esophagitis: Secondary | ICD-10-CM | POA: Diagnosis not present

## 2023-03-28 DIAGNOSIS — M797 Fibromyalgia: Secondary | ICD-10-CM | POA: Diagnosis not present

## 2023-03-28 DIAGNOSIS — D649 Anemia, unspecified: Secondary | ICD-10-CM | POA: Diagnosis not present

## 2023-03-28 DIAGNOSIS — G4733 Obstructive sleep apnea (adult) (pediatric): Secondary | ICD-10-CM | POA: Diagnosis not present

## 2023-03-28 DIAGNOSIS — K589 Irritable bowel syndrome without diarrhea: Secondary | ICD-10-CM | POA: Diagnosis not present

## 2023-03-28 DIAGNOSIS — L732 Hidradenitis suppurativa: Secondary | ICD-10-CM | POA: Diagnosis not present

## 2023-03-28 DIAGNOSIS — Z7985 Long-term (current) use of injectable non-insulin antidiabetic drugs: Secondary | ICD-10-CM | POA: Diagnosis not present

## 2023-03-28 DIAGNOSIS — I5020 Unspecified systolic (congestive) heart failure: Secondary | ICD-10-CM | POA: Diagnosis not present

## 2023-03-28 NOTE — Progress Notes (Deleted)
 03/28/2023 [name] [id number] 04-Feb-1970  Referring provider: [name], MD Primary GI doctor:Dr. Leonides Schanz (Dr. Christella Hartigan)  ASSESSMENT AND PLAN:   Anemia New onset, no menstrual bleeding, some rectal bleeding since July, history of B12 def not on medications -Order labs to investigate cause of anemia. - If iron def, may need to consider EGD/colon  Rectal Bleeding Ongoing since July, associated with mucus in stool and rectal pain, constipation. History of hemorrhoidectomy 15 years ago. Hemorrhoids noted on exam -Sitz baths, increase fiber, increase water -Hydrocortisone supp given and external cream sent in.  -We discussed hemorrhoid banding here in the office for internal hemorrhoids if not improving with conservative treatment. - follow up for evaluation here in the office.   Constipation with large hard stool/impaction on exam Colon 2020 tics, otherwise normal, recall 2027 Bowel movements every 3-4 days, straining with bowel movements, hard stool noted on rectal exam. Patient on tramadol and Ozempic, both of which can contribute to constipation. Miralax not helping -Order Golytely and advise patient to take first half on day 1 and second half on day 2, along with enemas, to address fecal impaction. -Start Linzess after bowel prep to help with bowel movements and abdominal discomfort.  Gastroesophageal Reflux Disease (GERD) with nausea and vomiting Daily symptoms, possibly related to Ozempic. Patient on Prilosec, but still experiencing symptoms. -Increase Prilosec to 40mg  twice daily. - stop NSAIDS -Switch from Zofran to Promethazine for symptom control. -Provide gastroparesis diet information and consider endoscopy depending on lab results. Discussed GLP1 with the patient, mechanism of action and how this can worsen and/or cause nausea by causing gastroparesis.   Discuss with primary care see about potentially getting off this medication.  Follow-up Close  follow-up recommended due to multiple ongoing issues. Patient to message or call if any changes occur.   Patient Care Team: [name], MD as PCP - General (Internal Medicine) Rennis Golden Lisette Abu, MD as PCP - Cardiology (Cardiology)  HISTORY OF PRESENT ILLNESS: 53 y.o. female with a past medical history of HTN, COPD, OSA, HTN, heart failure, diabetes, IBS-C, and others listed below presents for evaluation of   Review of pertinent gastrointestinal problems: 1. Adenomatous polyps in her colon;   colonoscopy in 07/2007 with one polyp that was removed and was a hyperplastic polyp; also had hemorrhoids.  Colonoscopy  04/2015 Dr. Christella Hartigan (minor rectal bleeding); this was an incomplete colonoscopy 2 to BMI 50, I was only able to get to the ascending colon; 4 polyps were removed, 3 of them were adenomatous.  My office arranged barium enema but she never had that done. Colonoscopy 12/31/2018 with Dr. Christella Hartigan good bowel prep diverticulosis left colon no polyps or cancers recall 7 years 2.  Dyspepsia led to upper endoscopy May 2012.  Minor gastritis was noted, biopsies showed no sign of H. Pylori. Ultrasound June 2012 was normal.   HIDA scan June 2012 was also normal.   CT scan August 2012 was essentially normal except for "prominent stool"  08/10/2022 CT abdomen pelvis with contrast for abdominal pain shows mild diverticulosis without diverticulitis Initial CT and pelvis with right lower quadrant abdominal pain showed slightly enlarged appendix without any acute inflammation, suggested repeat 12 to 24 hours 08/10/2022 repeat CT abdomen pelvis with contrast for right lower quadrant abdominal pain showed prominent stool throughout the colon favors constipation, normal appendix, suspected 2.1 cm right uterine fibroid and aortic atherosclerosis  08/19/2022 labs reviewed show hemoglobin 10.6, has had 3 months of normocytic anemia MCV 86.1, platelets  484, normal liver and kidney 09/25/2022 internal medicine visit  with her primary care after multiple falls leading to ER visits, continue abdominal pain low-grade fever.  Patient was being tapered off tramadol for fear of serotonergic effect.  She is going from tramadol 50 mg every 4 hours every 6 hours    She  reports that she quit smoking about 3 years ago. Her smoking use included cigarettes. She started smoking about 28 years ago. She has a 2.5 pack-year smoking history. She has never used smokeless tobacco. She reports that she does not currently use alcohol. She reports that she does not currently use drugs after having used the following drugs: Marijuana. Frequency: 3.00 times per week.  RELEVANT LABS AND IMAGING:  Results          CBC    Component Value Date/Time   WBC 8.2 02/25/2023 1055   RBC 3.90 02/25/2023 1055   HGB 10.4 (L) 02/25/2023 1055   HGB 11.4 08/07/2022 1024   HCT 33.8 (L) 02/25/2023 1055   HCT 34.5 08/07/2022 1024   PLT 430 (H) 02/25/2023 1055   PLT 421 08/07/2022 1024   MCV 86.7 02/25/2023 1055   MCV 83 08/07/2022 1024   MCH 26.7 02/25/2023 1055   MCHC 30.8 02/25/2023 1055   RDW 17.2 (H) 02/25/2023 1055   RDW 14.3 08/07/2022 1024   LYMPHSABS 2.8 01/12/2023 1240   LYMPHSABS 2.0 08/07/2022 1024   MONOABS 0.7 01/12/2023 1240   EOSABS 0.2 01/12/2023 1240   EOSABS 0.2 08/07/2022 1024   BASOSABS 0.0 01/12/2023 1240   BASOSABS 0.0 08/07/2022 1024   Recent Labs    05/16/22 0858 08/07/22 1024 08/09/22 2024 08/10/22 0820 08/11/22 0223 08/19/22 1027 12/03/22 1014 01/12/23 1240 01/13/23 0341 02/25/23 1055  HGB 12.2 11.4 10.1* 10.9* 9.3* 10.6* 11.3* 10.2* 10.1* 10.4*    CMP     Component Value Date/Time   NA 146 (H) 02/25/2023 1055   NA 142 08/07/2022 1024   K 4.2 02/25/2023 1055   CL 107 02/25/2023 1055   CO2 25 02/25/2023 1055   GLUCOSE 95 02/25/2023 1055   BUN 10 02/25/2023 1055   BUN 12 08/07/2022 1024   CREATININE 1.11 (H) 02/25/2023 1055   CREATININE 0.88 09/15/2013 1032   CALCIUM 8.9 02/25/2023  1055   PROT 6.1 (L) 01/12/2023 1240   PROT 6.7 08/07/2022 1024   ALBUMIN 3.2 (L) 01/12/2023 1240   ALBUMIN 4.3 08/07/2022 1024   AST 12 (L) 01/12/2023 1240   ALT 8 01/12/2023 1240   ALKPHOS 60 01/12/2023 1240   BILITOT 0.5 01/12/2023 1240   BILITOT <0.2 08/07/2022 1024   GFRNONAA 59 (L) 02/25/2023 1055   GFRAA 67 02/02/2020 0933      Latest Ref Rng & Units 01/12/2023   12:40 PM 12/03/2022   10:14 AM 08/19/2022   10:27 AM  Hepatic Function  Total Protein 6.5 - 8.1 g/dL 6.1  7.3  6.7   Albumin 3.5 - 5.0 g/dL 3.2  4.1  3.5   AST 15 - 41 U/L 12  6  12    ALT 0 - 44 U/L 8  6  10    Alk Phosphatase 38 - 126 U/L 60  54  67   Total Bilirubin <1.2 mg/dL 0.5  0.4  0.2       Current Medications:   Current Outpatient Medications (Endocrine & Metabolic):    Semaglutide, 2 MG/DOSE, (OZEMPIC, 2 MG/DOSE,) 8 MG/3ML SOPN, Inject 2 mg into the skin once a week.  Current Outpatient Medications (Cardiovascular):    atorvastatin (LIPITOR) 40 MG tablet, TAKE 1 TABLET BY MOUTH EVERY DAY   ENTRESTO 24-26 MG, TAKE 1 TABLET BY MOUTH TWICE A DAY   metoprolol succinate (TOPROL-XL) 50 MG 24 hr tablet, Take 1 tablet (50 mg total) by mouth daily.   torsemide (DEMADEX) 20 MG tablet, TAKE 1 TABLET BY MOUTH TWICE A DAY  Current Outpatient Medications (Respiratory):    albuterol (PROVENTIL) (2.5 MG/3ML) 0.083% nebulizer solution, Take 3 mLs (2.5 mg total) by nebulization every 6 (six) hours as needed for wheezing or shortness of breath.   albuterol (VENTOLIN HFA) 108 (90 Base) MCG/ACT inhaler, TAKE 2 PUFFS BY MOUTH EVERY 6 HOURS AS NEEDED FOR WHEEZE OR SHORTNESS OF BREATH   Azelastine HCl 137 MCG/SPRAY SOLN, PLACE 1 SPRAY INTO BOTH NOSTRILS 2 (TWO) TIMES DAILY. USE IN EACH NOSTRIL AS DIRECTED   cetirizine (ZYRTEC) 10 MG tablet, Take 1 tablet (10 mg total) by mouth daily.   fluticasone (FLONASE) 50 MCG/ACT nasal spray, SPRAY 1 SPRAY INTO EACH NOSTRIL DAILY   ipratropium (ATROVENT) 0.06 % nasal spray, PLEASE SEE  ATTACHED FOR DETAILED DIRECTIONS   promethazine (PHENERGAN) 25 MG tablet, TAKE 1 TABLET BY MOUTH EVERY 6 HOURS AS NEEDED FOR NAUSEA OR VOMITING.  Current Outpatient Medications (Analgesics):    acetaminophen (TYLENOL) 500 MG tablet, Take 1 tablet (500 mg total) by mouth every 6 (six) hours as needed. (Patient not taking: Reported on 12/03/2022)   ibuprofen (ADVIL) 200 MG tablet, Take 2 tablets (400 mg total) by mouth every 8 (eight) hours as needed for moderate pain (pain score 4-6).   Current Outpatient Medications (Other):    ACCU-CHEK GUIDE test strip, USE ONE TEST STRIP TO CHECK BLOOD SUGARS ONCE A DAY AS NEEDED (Patient not taking: Reported on 01/12/2023)   Accu-Chek Softclix Lancets lancets, USE ONE LANCET TO CHECK BLOOD SUGAR ONE A DAY AS NEEDED (Patient not taking: Reported on 01/12/2023)   Blood Glucose Monitoring Suppl (ACCU-CHEK GUIDE ME) w/Device KIT, Dispense one device   Brexpiprazole (REXULTI) 3 MG TABS, Take 1 tablet (3 mg total) by mouth daily.   dicyclomine (BENTYL) 10 MG capsule, Take 1 capsule (10 mg total) by mouth 3 (three) times daily as needed for spasms.   doxepin (SINEQUAN) 25 MG capsule, Take 1 capsule (25 mg total) by mouth at bedtime.   lamoTRIgine (LAMICTAL) 100 MG tablet, Take 0.5 tablets (50 mg total) by mouth 2 (two) times daily.   linaclotide (LINZESS) 290 MCG CAPS capsule, Take 1 capsule (290 mcg total) by mouth daily before breakfast.   megestrol (MEGACE) 40 MG tablet, Take 2 tablets (80 mg total) by mouth daily.   omeprazole (PRILOSEC) 40 MG capsule, TAKE 1 CAPSULE (40 MG TOTAL) BY MOUTH DAILY.   ondansetron (ZOFRAN-ODT) 8 MG disintegrating tablet, Take 1 tablet (8 mg total) by mouth every 8 (eight) hours as needed.   tiZANidine (ZANAFLEX) 4 MG tablet, TAKE 1 TABLET (4 MG TOTAL) BY MOUTH EVERY 8 (EIGHT) HOURS AS NEEDED FOR MUSCLE SPASMS  Medical History:  Past Medical History:  Diagnosis Date   Acid reflux    Anemia    Iron Def   Anorexia    CHF  (congestive heart failure) (HCC)    Chronic kidney disease    Nephrotic syndrome   Colon polyp 2009   Depression with anxiety    Edema leg    Fibroid tumor 04/2022   Fibromyalgia    Hemorrhoids    Hidradenitis suppurativa  Hypertension    IBS (irritable bowel syndrome)    Low back pain    Migraines    Morbidly obese (HCC)    Neuromuscular disorder (HCC)    fibromyalgia   Neuropathy    Panic attacks    Polyp of vocal cord or larynx    Stroke (HCC)    Tonsil pain    Allergies:  Allergies  Allergen Reactions   Lisinopril Rash and Cough     Surgical History:  She  has a past surgical history that includes Hemorrhoid surgery; Upper gastrointestinal endoscopy; Axillary hidradenitis excision; Inguinal hidradenitis excision; Tonsillectomy (10/18/2010); Colonoscopy with propofol (N/A, 05/25/2015); and Colonoscopy with propofol (N/A, 12/31/2018). Family History:  Her family history includes Anxiety disorder in her mother and sister; Cancer in her father; Depression in her mother, sister, and sister; Diabetes in her mother; Heart disease in her father and mother; High blood pressure in her mother; Kidney failure in her mother; Learning disabilities in her sister.  REVIEW OF SYSTEMS  : All other systems reviewed and negative except where noted in the History of Present Illness.  PHYSICAL EXAM: There were no vitals taken for this visit. General Appearance: Well nourished, in no apparent distress. Head:   Normocephalic and atraumatic. Eyes:  sclerae anicteric,conjunctive pink  Respiratory: Respiratory effort normal, BS equal bilaterally without rales, rhonchi, wheezing. Cardio: RRR with no MRGs. Peripheral pulses intact.  Abdomen: Soft,  Obese ,active bowel sounds. mild tenderness in the RUQ and in the lower abdomen. Without guarding and Without rebound. No masses. RECTAL/ANOSCOPY: No fissures observed. No external hemorrhoids observed. Decreased rectal tone. Presence of large  hemorrhoids and a large amount of very hard stool. Negative hemoccult.  Musculoskeletal: Full ROM, Normal gait. Without edema. Skin:  Dry and intact without significant lesions or rashes Neuro: Alert and  oriented x4;  No focal deficits. Psych:  Cooperative. Normal mood and affect.    Doree Albee, PA-C 8:13 AM

## 2023-03-28 NOTE — Progress Notes (Signed)
 Virtual Visit via Video Note  I connected with Lindsay [name] on 03/28/23 at  8:00 AM EST by a video enabled telemedicine application and verified that I am speaking with the correct person using two identifiers.  Location: Patient: home Provider: home office   I discussed the limitations of evaluation and management by telemedicine and the availability of in person appointments. The patient expressed understanding and agreed to proceed.   I discussed the assessment and treatment plan with the patient. The patient was provided an opportunity to ask questions and all were answered. The patient agreed with the plan and demonstrated an understanding of the instructions.   The patient was advised to call back or seek an in-person evaluation if the symptoms worsen or if the condition fails to improve as anticipated.  I provided 45 minutes of non-face-to-face time during this encounter.  THERAPIST PROGRESS NOTE  Session Time: 8:00 AM to 8:45 AM  Participation Level: Active  Behavioral Response: CasualAlertappropriate  Type of Therapy: Individual Therapy  Treatment Goals addressed: patient continue to utilize therapy for  supportive and strength-based interventions, provide treatment interventions in the context of patient continuing to seek help and cope with medical issues, anxiety, triggers, coping with mental health symptoms, trauma, utilize therapy as a way for patient to focus on working through current stressors that also helps to distract from pain  ProgressTowards Goals: Progressing-patient using therapy to help coping with stressors of medical care for supportive strength-based interventions talking about various stressors helpful to have an outlet to process thoughts and feelings  Interventions: Solution Focused, Strength-based, Supportive, and Other: coping  Summary: Lindsay [name] is a 53 y.o. female who presents with tired sleepy. Talked about Medicare cutting off video visits  talked about how video visits have helped people get care may affect [name]. Cutting off 80 million Medicaid. Today protest black out economic not supposed to go shopping.  Therapist familiar with patient being sleepy in the morning and know she has trouble with sleeping and patient says that is the source toss and turn at night a lot of pain in knee. Doctor definitely not there patient said she won't be there not scramble to see her won't let patient see her. Needs MRI of knee meniscus torn feels worse worse than when told had it. Doctor not there still need to get in doesn't want to deal with resident who will ask for referral for her from doctor. Wants MRI. They did it two years ago and said ligament torn on its way to healing then fell so doesn't know what is going on. Told her that it was going to heal itself. Already have iron deficiency need iron infusion. Forgot about the low iron very low from blood work. Energy pill thinks help. Talked about supplements interested don't look at if can't afford. Wants to go get some fish to eat. Knows how to clean the fish. All cooking learned from mom. Doesn't want to shop but have to figure out what they are going to eat for lunch and dinner. Might just end up getting pills. PT coming today, home health coming in 21/2 hours. Therapist noted keeping her busy. Have to call social security update ask about working oxygen don't know what to expect don't know what doctor is going say.  Therapist pointed out if anybody needs to be on disability gets patient she even struggles to get out of the house.  Had inspector come violated a bunch of stuff. Something on Mom's window have to  move the other things the landlord has to address. This inspector wrote a bunch of stuff not used to inspectors doing that. Noted it is always something. Has a cyst has flare up need to go somewhere. Priority is knee to get it looked at. Have to get something to eat and pick up meds at the drugstore.  Has  not been on resulting in therapist asked what that helps with helps more clarity in thinking.  Sometimes does not have focus.  Reviewed changes in Medicare they may not pay for video visits which could change our situation patient not being able to do video visits with therapist patient being in a different city.  Therapist has always thought video visits as an advancement for care making available to lots of people like patient who otherwise could not get it so this move would go backwards in terms of improving care.  Patient agrees hard to believe that what happen.  Patient does not know when but put that out there for therapist to be aware.  Reviewed doctors note patient fatigue does need infusion for anemia reviewed other things going on in her life to keep therapist updated that home health care coming in doing PT.  Prioritizing her knee looking at that is what is really bothering her as patient and therapist both say is always something to have to worry about in patient's case the inspection hoping the rent does not go up positive they are finding things the landlord has to take care of but there is the worry of the rent going up.  Talked about ways we are looking to boost energy therapist noting for example vitamin D helps with that patient did not know patient taking a supplement for energy therapist can relate finding the supplements helpful.  Assess helpful for patient to have connection relationship where she is a bit isolated helps her to have supportive interventions.  Therapist talked briefly about fighter flight talking about being more active with trauma for patient's education helpful to do relaxation to help calm the out of whack nervous system to help decrease amygdala activation noted also trauma work helps so we processed the memory just so patient has some idea how trauma symptoms are affecting her.  Therapist provided support and space for patient to talk about thoughts and feelings in  session.  Suicidal/Homicidal: No  Plan: Return again in  weeks.2.  Processed thoughts and feelings to help with coping provide supportive and strength-based intervention  Diagnosis: Major depressive disorder, recurrent, moderate, generalized anxiety disorder, chronic PTSD, panic attacks  Collaboration of Care: Medication Management AEB Dr. Lolly Mustache review of note  Patient/Guardian was advised Release of Information must be obtained prior to any record release in order to collaborate their care with an outside provider. Patient/Guardian was advised if they have not already done so to contact the registration department to sign all necessary forms in order for [name] to release information regarding their care.   Consent: Patient/Guardian gives verbal consent for treatment and assignment of benefits for services provided during this visit. Patient/Guardian expressed understanding and agreed to proceed.   Coolidge Breeze, [place] 03/28/2023

## 2023-03-28 NOTE — Telephone Encounter (Signed)
 Patient calls to cancel her follow up appointment on 03/31/23. She was last seen 12/03/22 for abdominal pain. Abdominal u/s and KUB were scheduled for 12/09/22. Patient was a no show. She also canceled her next 3 appointment for follow up here. She has not rescheduled her imaging. Patient is agreeable to rescheduling the imaging and then  an appointment with [facility] to follow a few days later. If this is okay with you, I will reach out to the radiology department for scheduling.

## 2023-03-31 ENCOUNTER — Ambulatory Visit: Payer: 59 | Admitting: Physician Assistant

## 2023-04-01 ENCOUNTER — Telehealth: Payer: Self-pay | Admitting: *Deleted

## 2023-04-01 DIAGNOSIS — E611 Iron deficiency: Secondary | ICD-10-CM | POA: Diagnosis not present

## 2023-04-01 DIAGNOSIS — I5020 Unspecified systolic (congestive) heart failure: Secondary | ICD-10-CM | POA: Diagnosis not present

## 2023-04-01 DIAGNOSIS — I959 Hypotension, unspecified: Secondary | ICD-10-CM | POA: Diagnosis not present

## 2023-04-01 DIAGNOSIS — I110 Hypertensive heart disease with heart failure: Secondary | ICD-10-CM | POA: Diagnosis not present

## 2023-04-01 DIAGNOSIS — L732 Hidradenitis suppurativa: Secondary | ICD-10-CM | POA: Diagnosis not present

## 2023-04-01 DIAGNOSIS — E559 Vitamin D deficiency, unspecified: Secondary | ICD-10-CM | POA: Diagnosis not present

## 2023-04-01 DIAGNOSIS — K589 Irritable bowel syndrome without diarrhea: Secondary | ICD-10-CM | POA: Diagnosis not present

## 2023-04-01 DIAGNOSIS — S92355D Nondisplaced fracture of fifth metatarsal bone, left foot, subsequent encounter for fracture with routine healing: Secondary | ICD-10-CM | POA: Diagnosis not present

## 2023-04-01 DIAGNOSIS — N049 Nephrotic syndrome with unspecified morphologic changes: Secondary | ICD-10-CM | POA: Diagnosis not present

## 2023-04-01 DIAGNOSIS — J449 Chronic obstructive pulmonary disease, unspecified: Secondary | ICD-10-CM | POA: Diagnosis not present

## 2023-04-01 DIAGNOSIS — M419 Scoliosis, unspecified: Secondary | ICD-10-CM | POA: Diagnosis not present

## 2023-04-01 DIAGNOSIS — G4733 Obstructive sleep apnea (adult) (pediatric): Secondary | ICD-10-CM | POA: Diagnosis not present

## 2023-04-01 DIAGNOSIS — K219 Gastro-esophageal reflux disease without esophagitis: Secondary | ICD-10-CM | POA: Diagnosis not present

## 2023-04-01 DIAGNOSIS — G43909 Migraine, unspecified, not intractable, without status migrainosus: Secondary | ICD-10-CM | POA: Diagnosis not present

## 2023-04-01 DIAGNOSIS — D649 Anemia, unspecified: Secondary | ICD-10-CM | POA: Diagnosis not present

## 2023-04-01 DIAGNOSIS — M797 Fibromyalgia: Secondary | ICD-10-CM | POA: Diagnosis not present

## 2023-04-01 DIAGNOSIS — J9610 Chronic respiratory failure, unspecified whether with hypoxia or hypercapnia: Secondary | ICD-10-CM | POA: Diagnosis not present

## 2023-04-01 DIAGNOSIS — E119 Type 2 diabetes mellitus without complications: Secondary | ICD-10-CM | POA: Diagnosis not present

## 2023-04-01 DIAGNOSIS — E785 Hyperlipidemia, unspecified: Secondary | ICD-10-CM | POA: Diagnosis not present

## 2023-04-01 DIAGNOSIS — Z9981 Dependence on supplemental oxygen: Secondary | ICD-10-CM | POA: Diagnosis not present

## 2023-04-01 DIAGNOSIS — Z7985 Long-term (current) use of injectable non-insulin antidiabetic drugs: Secondary | ICD-10-CM | POA: Diagnosis not present

## 2023-04-01 NOTE — Telephone Encounter (Signed)
 Call from [place].. Out to visit patient for PT.  C/o of feeling tired.  B/P readings were 98/57., 92/60 and 95/60.  Done within 30 minutes.  Patient is currently lying down.  Feels ok when she is lying down.  Patient thinks she may have taken 1 of her old blood pressure pills.  Aide is going to Pharmacy to pick up patients blood pressure medication now. Gracee was unable to see what dosage she took . Is drinking plenty of water and Gatorade.  Was encourage by PT and [name] to go to the ER.  States does not feel she needs to go now. Feels ok.  Will wait and see how she feels and then will go to the ER if needed.

## 2023-04-02 ENCOUNTER — Other Ambulatory Visit: Payer: Self-pay | Admitting: Physician Assistant

## 2023-04-02 NOTE — Telephone Encounter (Signed)
 Patient is scheduled for her imaging on 04/11/23. Called the patient to arrange a follow up appointment. Declines next available on 04/17/23. She wants a morning appointment due to her transportation issues. Agrees to an appointment on April 10 at 9:20 am. Appointment card mailed.

## 2023-04-03 ENCOUNTER — Ambulatory Visit (INDEPENDENT_AMBULATORY_CARE_PROVIDER_SITE_OTHER): Payer: 59 | Admitting: Dermatology

## 2023-04-03 ENCOUNTER — Ambulatory Visit: Payer: Self-pay | Admitting: Internal Medicine

## 2023-04-03 VITALS — BP 134/90

## 2023-04-03 DIAGNOSIS — L669 Cicatricial alopecia, unspecified: Secondary | ICD-10-CM | POA: Diagnosis not present

## 2023-04-03 NOTE — Progress Notes (Signed)
   New Patient Visit   Subjective  Lindsay [name] is a 54 y.o. female who presents for the following: New PT - Alopecia  Patient states she has hair loss located at the scalp that she would like to have examined. Patient reports the areas have been there for 1 year. She reports the areas are bothersome at times but not consistently. Patient rates when she does have irritation (itchy)8 out of 10. She states that the areas have not spread. Patient reports she has not previously been treated for these areas. Patient denied Hx of bx. Patient denied family history of skin cancer(s).   The following portions of the chart were reviewed this encounter and updated as appropriate: medications, allergies, medical history  Review of Systems:  No other skin or systemic complaints except as noted in HPI or Assessment and Plan.  Objective  Well appearing patient in no apparent distress; mood and affect are within normal limits.   A focused examination was performed of the following areas: scalp   Relevant exam findings are noted in the Assessment and Plan.                Assessment & Plan   1. Alopecia - Assessment: Significant hair loss on the vertex and frontal scalp, beginning approximately 10 months ago with rapid progression. Physical exam shows smooth scalp with loss of follicular ostia in affected areas. Periflicular erythema noted on the posterior scalp. Differential diagnosis includes alopecia areata, androgenetic alopecia, central centrifugal cicatricial alopecia (CCCA), and lupus-related alopecia. - Plan:    Perform 4-millimeter punch biopsy to rule out alopecia areata, androgenetic alopecia, CCCA, and lupus    Patient to apply Aquaphor to biopsy site daily    Follow-up appointment in 2 weeks for biopsy result review and suture removal    Treatment plan to be determined pending biopsy results  SCARRING ALOPECIA Mid Frontal Scalp Skin / nail biopsy - Mid Frontal Scalp Type of  biopsy: punch   Informed consent: discussed and consent obtained   Timeout: patient name, date of birth, surgical site, and procedure verified   Procedure prep:  Patient was prepped and draped in usual sterile fashion (the patient was cleaned and prepped) Prep type:  Isopropyl alcohol Anesthesia: the lesion was anesthetized in a standard fashion   Anesthetic:  1% lidocaine w/ epinephrine 1-100,000 buffered w/ 8.4% NaHCO3 Punch size:  3 mm Suture size:  4-0 Suture type: Prolene (polypropylene)   Hemostasis achieved with: suture, pressure and aluminum chloride   Outcome: patient tolerated procedure well   Post-procedure details: sterile dressing applied and wound care instructions given   Dressing type: bandage, petrolatum and pressure dressing   Specimen 1 - Surgical pathology Differential Diagnosis: r/o androgenetic alopecia vs alopecia areata  Check Margins: yes  No follow-ups on file.    Documentation: I have reviewed the above documentation for accuracy and completeness, and I agree with the above.  I, Shirron Marcha Solders, CMA, am acting as scribe for [employer], DO.   Langston Reusing, DO

## 2023-04-03 NOTE — Telephone Encounter (Signed)
 Copied from CRM [id number]. Topic: Referral - Question >> Apr 03, 2023  8:54 AM Philippa Chester F wrote: Reason for CRM: Patient called in concerned that she has to miss her appt in Dermatology for today 04/03/2023 at with Lynnell Catalan due to her BP being low 90/58-60 with accompanied symptoms of lightheadedness and nausea. Patient needs to have her dermatology appt rescheduled. Patient was warm transferred to Nurse Triage.

## 2023-04-03 NOTE — Telephone Encounter (Signed)
 RTC to patient has decided t go to Dermatology appointment this morning.  Unable to schedule appointment with the Clinics at this time for the dizziness.  Will call the Clinics back after she has completed the Dermatology appointment to schedule an appointment in the Clinics.

## 2023-04-03 NOTE — Patient Instructions (Addendum)
 Patient Handout: Wound Care for Skin Biopsy Site  Taking Care of Your Skin Biopsy Site  Proper care of the biopsy site is essential for promoting healing and minimizing scarring. This handout provides instructions on how to care for your biopsy site to ensure optimal recovery.  1. Cleaning the Wound:  Clean the biopsy site daily with gentle soap and water. Gently pat the area dry with a clean, soft towel. Avoid harsh scrubbing or rubbing the area, as this can irritate the skin and delay healing.  2. Applying Aquaphor and Bandage:  After cleaning the wound, apply a thin layer of Aquaphor ointment to the biopsy site. Cover the area with a sterile bandage to protect it from dirt, bacteria, and friction. Change the bandage daily or as needed if it becomes soiled or wet.  3. Continued Care for One Week:  Repeat the cleaning, Aquaphor application, and bandaging process daily for one week following the biopsy procedure. Keeping the wound clean and moist during this initial healing period will help prevent infection and promote optimal healing.  4. Massaging Aquaphor into the Area:  ---After one week, discontinue the use of bandages but continue to apply Aquaphor to the biopsy site. ----Gently massage the Aquaphor into the area using circular motions. ---Massaging the skin helps to promote circulation and prevent the formation of scar tissue.   Additional Tips:  Avoid exposing the biopsy site to direct sunlight during the healing process, as this can cause hyperpigmentation or worsen scarring. If you experience any signs of infection, such as increased redness, swelling, warmth, or drainage from the wound, contact your healthcare provider immediately. Follow any additional instructions provided by your healthcare provider for caring for the biopsy site and managing any discomfort. Conclusion:  Taking proper care of your skin biopsy site is crucial for ensuring optimal healing and  minimizing scarring. By following these instructions for cleaning, applying Aquaphor, and massaging the area, you can promote a smooth and successful recovery. If you have any questions or concerns about caring for your biopsy site, don't hesitate to contact your healthcare provider for guidance.     Important Information  Due to recent changes in healthcare laws, you may see results of your pathology and/or laboratory studies on MyChart before the doctors have had a chance to review them. We understand that in some cases there may be results that are confusing or concerning to you. Please understand that not all results are received at the same time and often the doctors may need to interpret multiple results in order to provide you with the best plan of care or course of treatment. Therefore, we ask that you please give [name] 2 business days to thoroughly review all your results before contacting the office for clarification. Should we see a critical lab result, you will be contacted sooner.   If You Need Anything After Your Visit  If you have any questions or concerns for your doctor, please call our main line at [phone] If no one answers, please leave a voicemail as directed and we will return your call as soon as possible. Messages left after 4 pm will be answered the following business day.   You may also send [name] a message via MyChart. We typically respond to MyChart messages within 1-2 business days.  For prescription refills, please ask your pharmacy to contact our office. Our fax number is [phone].  If you have an urgent issue when the clinic is closed that cannot wait until the next  business day, you can page your doctor at the number below.    Please note that while we do our best to be available for urgent issues outside of office hours, we are not available 24/7.   If you have an urgent issue and are unable to reach [name], you may choose to seek medical care at your doctor's  office, retail clinic, urgent care center, or emergency room.  If you have a medical emergency, please immediately call 911 or go to the emergency department. In the event of inclement weather, please call our main line at [phone] for an update on the status of any delays or closures.  Dermatology Medication Tips: Please keep the boxes that topical medications come in in order to help keep track of the instructions about where and how to use these. Pharmacies typically print the medication instructions only on the boxes and not directly on the medication tubes.   If your medication is too expensive, please contact our office at [phone] or send [name] a message through MyChart.   We are unable to tell what your co-pay for medications will be in advance as this is different depending on your insurance coverage. However, we may be able to find a substitute medication at lower cost or fill out paperwork to get insurance to cover a needed medication.   If a prior authorization is required to get your medication covered by your insurance company, please allow [name] 1-2 business days to complete this process.  Drug prices often vary depending on where the prescription is filled and some pharmacies may offer cheaper prices.  The website www.goodrx.com contains coupons for medications through different pharmacies. The prices here do not account for what the cost may be with help from insurance (it may be cheaper with your insurance), but the website can give you the price if you did not use any insurance.  - You can print the associated coupon and take it with your prescription to the pharmacy.  - You may also stop by our office during regular business hours and pick up a GoodRx coupon card.  - If you need your prescription sent electronically to a different pharmacy, notify our office through [facility] or by phone at [phone]

## 2023-04-03 NOTE — Telephone Encounter (Signed)
 Called pt back, pt hung up the phone. I spoke with nurse Venita Sheffield and have her to call pt back. Will forward this message to triage nurse.

## 2023-04-03 NOTE — Telephone Encounter (Signed)
 Copied from CRM [id number]. Topic: Clinical - Red Word Triage >> Apr 03, 2023  8:49 AM Philippa Chester F wrote: [facility] that prompted transfer to Nurse Triage: Low bp 90/58-60, Lightheaded   Chief Complaint: Lightheadedness  Symptoms: lightheadedness Frequency: since this morning Pertinent Negatives: Patient denies fever, chest pain, vomiting, diarrhea, bleeding Disposition: [] ED /[] Urgent Care (no appt availability in office) / [] Appointment(In office/virtual)/ []  [facility]/ [] Home Care/ [] Refused Recommended Disposition /[] [facility]/ []  Follow-up with PCP Additional Notes: Patient called and advised that she has been feeling lightheaded since this morning.  She states that she has vertigo but she took her meclizine earlier and that didn't help.  Patient states she doesn't feel as bad as she did the other day when her physical therapist was with her, who reported to the office that the patient was having low blood pressures at that time.  Patient states that she doesn't feel as bad as she did that day.  Patient denies any bleeding, diarrhea, fever, chest pain, or vomiting.  She states she might drink some fluids like she did the other day.  Patient did appear frustrated with the triage process so this RN got her connected with the office to be scheduled as soon as possible for patient/customer satisfaction. Attempted to connect patient with the PCP office to be scheduled and patient hung up prior to transfer.  PCP office advised that they would call her back at this time to get her scheduled.   Reason for Disposition  [1] MODERATE dizziness (e.g., interferes with normal activities) AND [2] has NOT been evaluated by doctor (or NP/PA) for this  (Exception: Dizziness caused by heat exposure, sudden standing, or poor fluid intake.)  Answer Assessment - Initial Assessment Questions 1. DESCRIPTION: "Describe your dizziness."     Lightheaded 2. LIGHTHEADED: "Do you feel lightheaded?"  (e.g., somewhat faint, woozy, weak upon standing)     Woozy when standing up 3. VERTIGO: "Do you feel like either you or the room is spinning or tilting?" (i.e. vertigo)     No 4. SEVERITY: "How bad is it?"  "Do you feel like you are going to faint?" "Can you stand and walk?"   - MILD: Feels slightly dizzy, but walking normally.   - MODERATE: Feels unsteady when walking, but not falling; interferes with normal activities (e.g., school, work).   - SEVERE: Unable to walk without falling, or requires assistance to walk without falling; feels like passing out now.      lightheaded 5. ONSET:  "When did the dizziness begin?"     This morning 6. AGGRAVATING FACTORS: "Does anything make it worse?" (e.g., standing, change in head position)     standing 7. HEART RATE: "Can you tell me your heart rate?" "How many beats in 15 seconds?"  (Note: not all patients can do this)       N/a 8. CAUSE: "What do you think is causing the dizziness?"     Unsure maybe low bp 9. RECURRENT SYMPTOM: "Have you had dizziness before?" If Yes, ask: "When was the last time?" "What happened that time?"     Low bp  recently  drank fluids 10. OTHER SYMPTOMS: "Do you have any other symptoms?" (e.g., fever, chest pain, vomiting, diarrhea, bleeding)       no  Protocols used: Dizziness - Lightheadedness-A-AH

## 2023-04-04 ENCOUNTER — Ambulatory Visit (HOSPITAL_COMMUNITY): Payer: 59

## 2023-04-04 DIAGNOSIS — I959 Hypotension, unspecified: Secondary | ICD-10-CM | POA: Diagnosis not present

## 2023-04-04 DIAGNOSIS — K219 Gastro-esophageal reflux disease without esophagitis: Secondary | ICD-10-CM | POA: Diagnosis not present

## 2023-04-04 DIAGNOSIS — M419 Scoliosis, unspecified: Secondary | ICD-10-CM | POA: Diagnosis not present

## 2023-04-04 DIAGNOSIS — I5020 Unspecified systolic (congestive) heart failure: Secondary | ICD-10-CM | POA: Diagnosis not present

## 2023-04-04 DIAGNOSIS — I110 Hypertensive heart disease with heart failure: Secondary | ICD-10-CM | POA: Diagnosis not present

## 2023-04-04 DIAGNOSIS — M797 Fibromyalgia: Secondary | ICD-10-CM | POA: Diagnosis not present

## 2023-04-04 DIAGNOSIS — J449 Chronic obstructive pulmonary disease, unspecified: Secondary | ICD-10-CM | POA: Diagnosis not present

## 2023-04-04 DIAGNOSIS — N049 Nephrotic syndrome with unspecified morphologic changes: Secondary | ICD-10-CM | POA: Diagnosis not present

## 2023-04-04 DIAGNOSIS — D649 Anemia, unspecified: Secondary | ICD-10-CM | POA: Diagnosis not present

## 2023-04-04 DIAGNOSIS — E119 Type 2 diabetes mellitus without complications: Secondary | ICD-10-CM | POA: Diagnosis not present

## 2023-04-04 DIAGNOSIS — S92355D Nondisplaced fracture of fifth metatarsal bone, left foot, subsequent encounter for fracture with routine healing: Secondary | ICD-10-CM | POA: Diagnosis not present

## 2023-04-04 DIAGNOSIS — L732 Hidradenitis suppurativa: Secondary | ICD-10-CM | POA: Diagnosis not present

## 2023-04-04 DIAGNOSIS — J9610 Chronic respiratory failure, unspecified whether with hypoxia or hypercapnia: Secondary | ICD-10-CM | POA: Diagnosis not present

## 2023-04-04 DIAGNOSIS — Z9981 Dependence on supplemental oxygen: Secondary | ICD-10-CM | POA: Diagnosis not present

## 2023-04-04 DIAGNOSIS — G4733 Obstructive sleep apnea (adult) (pediatric): Secondary | ICD-10-CM | POA: Diagnosis not present

## 2023-04-04 DIAGNOSIS — Z7985 Long-term (current) use of injectable non-insulin antidiabetic drugs: Secondary | ICD-10-CM | POA: Diagnosis not present

## 2023-04-04 DIAGNOSIS — E559 Vitamin D deficiency, unspecified: Secondary | ICD-10-CM | POA: Diagnosis not present

## 2023-04-04 DIAGNOSIS — E611 Iron deficiency: Secondary | ICD-10-CM | POA: Diagnosis not present

## 2023-04-04 DIAGNOSIS — E785 Hyperlipidemia, unspecified: Secondary | ICD-10-CM | POA: Diagnosis not present

## 2023-04-04 DIAGNOSIS — K589 Irritable bowel syndrome without diarrhea: Secondary | ICD-10-CM | POA: Diagnosis not present

## 2023-04-04 DIAGNOSIS — G43909 Migraine, unspecified, not intractable, without status migrainosus: Secondary | ICD-10-CM | POA: Diagnosis not present

## 2023-04-07 DIAGNOSIS — M79672 Pain in left foot: Secondary | ICD-10-CM | POA: Diagnosis not present

## 2023-04-07 LAB — SURGICAL PATHOLOGY

## 2023-04-08 ENCOUNTER — Encounter: Payer: Self-pay | Admitting: Dermatology

## 2023-04-08 NOTE — Progress Notes (Signed)
 Bx confirms CCCA scarring alopecia.  I will discuss in detail at her F/U / SRM in 1 week

## 2023-04-09 DIAGNOSIS — M47816 Spondylosis without myelopathy or radiculopathy, lumbar region: Secondary | ICD-10-CM | POA: Diagnosis not present

## 2023-04-09 DIAGNOSIS — G8929 Other chronic pain: Secondary | ICD-10-CM | POA: Diagnosis not present

## 2023-04-09 DIAGNOSIS — M47894 Other spondylosis, thoracic region: Secondary | ICD-10-CM | POA: Diagnosis not present

## 2023-04-09 DIAGNOSIS — M23202 Derangement of unspecified lateral meniscus due to old tear or injury, unspecified knee: Secondary | ICD-10-CM | POA: Diagnosis not present

## 2023-04-09 DIAGNOSIS — M25562 Pain in left knee: Secondary | ICD-10-CM | POA: Diagnosis not present

## 2023-04-09 DIAGNOSIS — M546 Pain in thoracic spine: Secondary | ICD-10-CM | POA: Diagnosis not present

## 2023-04-10 ENCOUNTER — Other Ambulatory Visit: Payer: Self-pay | Admitting: Internal Medicine

## 2023-04-10 DIAGNOSIS — E1121 Type 2 diabetes mellitus with diabetic nephropathy: Secondary | ICD-10-CM

## 2023-04-10 DIAGNOSIS — E785 Hyperlipidemia, unspecified: Secondary | ICD-10-CM

## 2023-04-11 ENCOUNTER — Ambulatory Visit (HOSPITAL_COMMUNITY)

## 2023-04-11 ENCOUNTER — Ambulatory Visit (HOSPITAL_COMMUNITY): Payer: 59 | Admitting: Licensed Clinical Social Worker

## 2023-04-11 DIAGNOSIS — F411 Generalized anxiety disorder: Secondary | ICD-10-CM | POA: Diagnosis not present

## 2023-04-11 DIAGNOSIS — F331 Major depressive disorder, recurrent, moderate: Secondary | ICD-10-CM

## 2023-04-11 DIAGNOSIS — F410 Panic disorder [episodic paroxysmal anxiety] without agoraphobia: Secondary | ICD-10-CM

## 2023-04-11 DIAGNOSIS — F4312 Post-traumatic stress disorder, chronic: Secondary | ICD-10-CM | POA: Diagnosis not present

## 2023-04-11 NOTE — Progress Notes (Signed)
 Virtual Visit via Video Note  I connected with [name] on 04/11/23 at  8:00 AM EDT by a video enabled telemedicine application and verified that I am speaking with the correct person using two identifiers.  Location: Patient: home Provider: home office   I discussed the limitations of evaluation and management by telemedicine and the availability of in person appointments. The patient expressed understanding and agreed to proceed.   I discussed the assessment and treatment plan with the patient. The patient was provided an opportunity to ask questions and all were answered. The patient agreed with the plan and demonstrated an understanding of the instructions.   The patient was advised to call back or seek an in-person evaluation if the symptoms worsen or if the condition fails to improve as anticipated.  I provided 40 minutes of non-face-to-face time during this encounter.  THERAPIST PROGRESS NOTE  Session Time: 8:00 AM to 8:40 AM  Participation Level: Active  Behavioral Response: CasualAlertanxious, very uncomfortable in session with pain issues  Type of Therapy: Individual Therapy  Treatment Goals addressed: patient continue to utilize therapy for  supportive and strength-based interventions, provide treatment interventions in the context of patient continuing to seek help and cope with medical issues, anxiety, triggers, coping with mental health symptoms, trauma, utilize therapy as a way for patient to focus on working through current stressors that also helps to distract from pain  ProgressTowards Goals: Progressing-therapist had looked at note from pain management at [facility] looks like they may be able to help her we will see something positive additionally talked about stressor moving things causing panic reviewed coping for panic problem solved around issue that would help relieve stressor  Interventions: Solution Focused, Strength-based, Supportive, and Other:  coping  Summary: Lindsay [name] is a 53 y.o. female who presents with therapist noting reviewing note from pain management patient shared about the visit the doctor asked why the doctor hadn't done certain things and patient didn't know. She knew the medical record before she came. Back injections next Friday. Ask her to take over with pain. Right now being managed by emergency Orthopedic and want her to take over. She doesn't know why Oxycodone immediate release she would put it with Tylenol or Tramadol. Ortho later this month for feet. Still in pain toes to top of foot then right part of knee where torn meniscus since fall hurting worse than ever did. Don't like the way he was talking. Doesn't know about back injections could be dangerous do injections then burn the nerves. Hurt really bad worse then when she went in shallow breathing keep up at night. Between feet back and knee. Maybe emergency go to room don't go to knee MRI until April 14. Ask for an order she thinks for  [facility] radiology to get the test done sooner. How going to manage pain? She doesn't know. Storage shed has to be emptied out citation more work has to be done. Owner won't fix the shed so stuff has to come out doesn't know where man power will come from doesn't have the money to pay for that. Has had attacks last couple of days. Might be stuff want throw away some stuff want to hold on to. Deadline 23 ask for extension just finding out about this describes symptoms heart beating fast, trembley nervous and can't breath.Not easy fix. Not anything can do has to figure out what to do and what to do with the stuff. Therapist asked if men in the family nobody  patient said  [name] was a resource but now sick can't help. Can't hire labor can't afford that. Only patient and Mom daughter is sick doesn't work like used to do. Cyst on ovaries, fibroid  tumor, place on breast area patient says you know what that could be. Patient needs to lay down hurting  up since Tuesday. Got up this morning sluggish have to get up for therapist.  Therapist could not think of a plan but will reach out patient understand she said at least document what dealing with. Brainstormed with patient difficult to think of a place that would help with manual labor. Maybe charity work but where would she go for that?   Therapist had seen record and saw her something positive and assessment by Dr. Claiborne Billings seem thorough for patient's pain issues giving her different diagnoses that seems like some progress.  Patient agrees.  We talked about the plan will have injections if that does not work we will have nerves burned patient wants an alternative to that we will ask about it.  An MRI on the 14th both patient and therapist think that is too far out for her to wait as visibly and pain in session uncomfortable will probably have an order so she can have it done quicker.  Therapist noted the positive of providers who seem to be paying attention to her this is the first step in addressing what she needs.  Patient talked about main stressor having to clear out stuff from a shack very stressful because there is no one to help her it would be only her mom and her and patient has severe pain and multiple diagnoses the point hard for her to even move.  Talked about the basics of managing panic deep breathing and calming statements so you do not catastrophize but therapist noted this is real stress cannot just erase it so brainstormed about things patient herself stressed because she cannot think of what to do.  Therapist noted will reach out to community resources identified the problem is manual labor that she needs to move the stuff.  Assess helpful dealing with the stress and the pain to have therapy to talk through these things that help with coping.   Suicidal/Homicidal: No  Plan: Return again in 2 weeks.2.  Reach out to providers in community to see if any resources for patient in manual labor. 3.   Patient used session to talk to things helps with coping work on symptoms most currently panic.   Diagnosis: Major depressive disorder, recurrent, moderate, generalized anxiety disorder, chronic PTSD, panic attacks  Collaboration of Care: Other looked over [facility]. Binnie Kand [facility] from [facility] who does pain management  Patient/Guardian was advised Release of Information must be obtained prior to any record release in order to collaborate their care with an outside provider. Patient/Guardian was advised if they have not already done so to contact the registration department to sign all necessary forms in order for [name] to release information regarding their care.   Consent: Patient/Guardian gives verbal consent for treatment and assignment of benefits for services provided during this visit. Patient/Guardian expressed understanding and agreed to proceed.   Coolidge Breeze, LCSW 04/11/2023

## 2023-04-14 NOTE — Telephone Encounter (Signed)
 Can I refill her promethazine one time?

## 2023-04-15 ENCOUNTER — Other Ambulatory Visit: Payer: Self-pay | Admitting: Internal Medicine

## 2023-04-15 DIAGNOSIS — S92322A Displaced fracture of second metatarsal bone, left foot, initial encounter for closed fracture: Secondary | ICD-10-CM | POA: Diagnosis not present

## 2023-04-15 DIAGNOSIS — S92352A Displaced fracture of fifth metatarsal bone, left foot, initial encounter for closed fracture: Secondary | ICD-10-CM | POA: Diagnosis not present

## 2023-04-15 DIAGNOSIS — S92332A Displaced fracture of third metatarsal bone, left foot, initial encounter for closed fracture: Secondary | ICD-10-CM | POA: Diagnosis not present

## 2023-04-15 DIAGNOSIS — S92342A Displaced fracture of fourth metatarsal bone, left foot, initial encounter for closed fracture: Secondary | ICD-10-CM | POA: Diagnosis not present

## 2023-04-15 DIAGNOSIS — R42 Dizziness and giddiness: Secondary | ICD-10-CM

## 2023-04-15 MED ORDER — PROMETHAZINE HCL 25 MG PO TABS: 25.0000 mg | ORAL_TABLET | Freq: Four times a day (QID) | ORAL | 0 refills | Status: DC | PRN

## 2023-04-15 NOTE — Addendum Note (Signed)
 Addended by: Quentin Mulling on: 04/15/2023 07:40 AM   Modules accepted: Orders

## 2023-04-15 NOTE — Telephone Encounter (Signed)
 Left the patient a voicemail to notify her of this.

## 2023-04-15 NOTE — Telephone Encounter (Signed)
 Medication discontinued on 01/14/23

## 2023-04-16 ENCOUNTER — Ambulatory Visit (INDEPENDENT_AMBULATORY_CARE_PROVIDER_SITE_OTHER): Admitting: Dermatology

## 2023-04-16 ENCOUNTER — Encounter: Payer: Self-pay | Admitting: Dermatology

## 2023-04-16 VITALS — BP 128/84 | HR 94

## 2023-04-16 DIAGNOSIS — L6681 Central centrifugal cicatricial alopecia: Secondary | ICD-10-CM

## 2023-04-16 MED ORDER — SAFETY SEAL MISCELLANEOUS MISC: 1.0000 | Freq: Every morning | 6 refills | Status: DC

## 2023-04-16 NOTE — Progress Notes (Signed)
   Follow-Up Visit   Subjective  Lindsay [name] is a 53 y.o. female who presents for the following: CCCA (Central centrifugal cicatricial alopecia)  Patient present today for follow up visit for CCCA. Patient was last evaluated on 04/03/23. At this visit a punch bx was performed. Biopsy results confirmed diagnosis of Central centrifugal cicatricial alopecia (CCCA). Patient reports sxs are unchanged. Patient denies medication changes.  The following portions of the chart were reviewed this encounter and updated as appropriate: medications, allergies, medical history  Review of Systems:  No other skin or systemic complaints except as noted in HPI or Assessment and Plan.  Objective  Well appearing patient in no apparent distress; mood and affect are within normal limits.  A focused examination was performed of the following areas: Frontal Scalp  Biopsy Results Reviewed In Detail  REPORT OF DERMATOPATHOLOGY FINAL DIAGNOSIS and MICROSCOPIC DESCRIPTION Diagnosis Skin (A), mid frontal scalp END STAGE SCARRING ALOPECIA, SEE DESCRIPTION Microscopic Description There is a markedly diminished number of terminal follicles with fibrotic tracts present at the sites of pre-existing follicles. The surrounding dermis shows sparse perivascular inflammation. An elastic tissue stain highlights the areas of scarring by zones of diminished fibers, and a PAS stain with appropriate controls, is negative for fungal hyphae. The findings are not diagnostic but show changes of an end stage scarring alopecia. This may represent an old burnt-out lesion of lichen planopilaris, central centrifugal scarring alopecia or folliculitis decalvans, but this pattern does not further define the etiology.  Relevant exam findings are noted in the Assessment and Plan.              Assessment & Plan   Central centrifugal cicatricial alopecia (CCCA)  Exam: Scarring alopecia characterized by patches of permanent hair loss  that manifest on the vertex or crown of the scalp, progressively spreading outward in a centrifugal pattern  - Assessment: Biopsy results confirmed end-stage scarring alopecia, consistent with lichen planopilaris, central centrifugal scarring alopecia (CCCA), or inflammatory folliculitis. Patient's presentation matches CCCA, which starts at the top of the scalp and spreads to the front. Biopsy showed diminished follicles and significant inflammation. Not all follicles are scarred, indicating potential for treatment to improve hair growth around scarred areas and prevent further progression.  Treatment Plan: - We reviewed the pathology report in detail with patient and provided details education on what CCCA is and its prognosis. - will prescribe AA Gel to [place], to apply daily - Recommended taking daily Vivascal and Vital Protein daily to enhance hair growth  - Advised that if in 4 months topicals and supplements are ineffective we will plan to complete ILK injection - We will plan to follow up in 4 months to re-assess progress  CENTRAL CENTRIFUGAL CICATRICIAL ALOPECIA   Related Medications Safety Seal Miscellaneous MISC Apply 1 Application topically in the morning. Medication Name; AA Gel  Return in 4 months (on 08/16/2023) for CCCAlopecia F/U.  Documentation: I have reviewed the above documentation for accuracy and completeness, and I agree with the above.  Stasia Cavalier, am acting as scribe for Langston Reusing, DO.  Langston Reusing, DO

## 2023-04-16 NOTE — Patient Instructions (Addendum)
 Hello Lindsay [name],  Thank you for visiting my office today. I appreciate your commitment to improving your health and addressing your dermatological concerns.  Here is a summary of the key instructions and information from today's visit:  Diagnosis: Central Centrifugal Cicatricial Alopecia (CCCA)  - Biopsy Results: Your biopsy confirmed end-stage scarring alopecia (CCCA)    - Medication: A compounded prescription from [facility] pharmacy, including clobetasol and minoxidil. Apply every morning to the affected areas. The cost is $45 per bottle, not covered by insurance.  - Supplements: Consider taking Viviscal (two tablets daily) and Vital Proteins collagen powder.  - Follow-Up: Schedule a return visit in four months to assess progress and discuss potential additional treatments, such as clobetasol injections if necessary.  - Expected Timeline: It may take about 4 months to see initial improvements, with a plateau typically reached between one to two years.  Please follow the treatment plan as discussed, and do not hesitate to reach out if you have any questions or concerns in the meantime.  Best regards,  Dr. Lang[facility]         Important Information   Due to recent changes in healthcare laws, you may see results of your pathology and/or laboratory studies on MyChart before the doctors have had a chance to review them. We understand that in some cases there may be results that are confusing or concerning to you. Please understand that not all results are received at the same time and often the doctors may need to interpret multiple results in order to provide you with the best plan of care or course of treatment. Therefore, we ask that you please give [name] 2 business days to thoroughly review all your results before contacting the office for clarification. Should we see a critical lab result, you will be contacted sooner.     If You Need Anything After Your Visit   If you  have any questions or concerns for your doctor, please call our main line at [phone]. If no one answers, please leave a voicemail as directed and we will return your call as soon as possible. Messages left after 4 pm will be answered the following business day.    You may also send [name] a message via MyChart. We typically respond to MyChart messages within 1-2 business days.  For prescription refills, please ask your pharmacy to contact our office. Our fax number is [phone].  If you have an urgent issue when the clinic is closed that cannot wait until the next business day, you can page your doctor at the number below.     Please note that while we do our best to be available for urgent issues outside of office hours, we are not available 24/7.    If you have an urgent issue and are unable to reach [name], you may choose to seek medical care at your doctor's office, retail clinic, urgent care center, or emergency room.   If you have a medical emergency, please immediately call 911 or go to the emergency department. In the event of inclement weather, please call our main line at [phone] for an update on the status of any delays or closures.  Dermatology Medication Tips: Please keep the boxes that topical medications come in in order to help keep track of the instructions about where and how to use these. Pharmacies typically print the medication instructions only on the boxes and not directly on the medication tubes.   If your medication is too expensive,  please contact our office at [phone] or send [name] a message through MyChart.    We are unable to tell what your co-pay for medications will be in advance as this is different depending on your insurance coverage. However, we may be able to find a substitute medication at lower cost or fill out paperwork to get insurance to cover a needed medication.    If a prior authorization is required to get your medication covered by your  insurance company, please allow [name] 1-2 business days to complete this process.   Drug prices often vary depending on where the prescription is filled and some pharmacies may offer cheaper prices.   The website www.goodrx.com contains coupons for medications through different pharmacies. The prices here do not account for what the cost may be with help from insurance (it may be cheaper with your insurance), but the website can give you the price if you did not use any insurance.  - You can print the associated coupon and take it with your prescription to the pharmacy.  - You may also stop by our office during regular business hours and pick up a GoodRx coupon card.  - If you need your prescription sent electronically to a different pharmacy, notify our office through [facility] or by phone at [phone]

## 2023-04-18 DIAGNOSIS — M47814 Spondylosis without myelopathy or radiculopathy, thoracic region: Secondary | ICD-10-CM | POA: Diagnosis not present

## 2023-04-18 DIAGNOSIS — M546 Pain in thoracic spine: Secondary | ICD-10-CM | POA: Diagnosis not present

## 2023-04-18 DIAGNOSIS — E119 Type 2 diabetes mellitus without complications: Secondary | ICD-10-CM | POA: Diagnosis not present

## 2023-04-18 DIAGNOSIS — G8929 Other chronic pain: Secondary | ICD-10-CM | POA: Diagnosis not present

## 2023-04-22 ENCOUNTER — Other Ambulatory Visit: Payer: Self-pay | Admitting: Physician Assistant

## 2023-04-22 DIAGNOSIS — K589 Irritable bowel syndrome without diarrhea: Secondary | ICD-10-CM

## 2023-04-25 ENCOUNTER — Ambulatory Visit (HOSPITAL_COMMUNITY): Payer: 59 | Admitting: Licensed Clinical Social Worker

## 2023-04-25 DIAGNOSIS — F411 Generalized anxiety disorder: Secondary | ICD-10-CM

## 2023-04-25 DIAGNOSIS — F4312 Post-traumatic stress disorder, chronic: Secondary | ICD-10-CM

## 2023-04-25 DIAGNOSIS — F331 Major depressive disorder, recurrent, moderate: Secondary | ICD-10-CM | POA: Diagnosis not present

## 2023-04-25 DIAGNOSIS — F410 Panic disorder [episodic paroxysmal anxiety] without agoraphobia: Secondary | ICD-10-CM

## 2023-04-25 NOTE — Progress Notes (Signed)
 Virtual Visit via Video Note  I connected with [name] on 04/25/23 at  8:00 AM EDT by a video enabled telemedicine application and verified that I am speaking with the correct person using two identifiers.  Location: Patient: home Provider: home office   I discussed the limitations of evaluation and management by telemedicine and the availability of in person appointments. The patient expressed understanding and agreed to proceed.   I discussed the assessment and treatment plan with the patient. The patient was provided an opportunity to ask questions and all were answered. The patient agreed with the plan and demonstrated an understanding of the instructions.   The patient was advised to call back or seek an in-person evaluation if the symptoms worsen or if the condition fails to improve as anticipated.  I provided 45 minutes of non-face-to-face time during this encounter.  THERAPIST PROGRESS NOTE  Session Time: 8:00 AM to 8:45 AM  Participation Level: Active  Behavioral Response: CasualAlertappropriate  Type of Therapy: Individual Therapy Treatment Goals addressed: patient continue to utilize therapy for  supportive and strength-based interventions, provide treatment interventions in the context of patient continuing to seek help and cope with medical issues, anxiety, triggers, coping with mental health symptoms, trauma, utilize therapy as a way for patient to focus on working through current stressors that also helps to distract from pain  ProgressTowards Goals: Progressing-patient has significant stressors assess helpful for coping therapeutic sessions for supportive interventions processing thoughts and feelings as well help with coping with stressors with emotional regulation  Interventions: Solution Focused, Strength-based, Supportive, and Other: coping  Summary: Lindsay [name] is a 53 y.o. female who presents with haven't slept from Tuesday until 2 AM this morning.  Stressed about a Runner, broadcasting/film/video. Staying up because of pain, getting MRI set up for back and torn meniscus, back injections didn't work. Want her to do injections again and then burn nerves in back. Not ready for that concern they will mess up could paralyze something. Has DDD tear something don't need for this procedure to cause more damage. [name] Overwhelming to keep up with appointments also emergency ortho for pain management. When get results from MRI take it from there.  Finally emptied shed they are either going to get rid of it or put un a roof. Still things that needed done for landlord to do. A lot of dumb stuff. Now moving her stuff back in had to move out of the way so inspector could step in there identify the broken glass. Sister had to pay friends moved stuff heavy. If wants to get rid of stuff all at once has to find a way to get to dump. Aid there can't say anything. Not doing things she needs to do in household and sleeps while there. She will be gone shortly had enough of it. Concern about changes for example with social security.  Therapist noted very good news that she was able to get her stuff out of the shed patient said her sister Misty Stanley helped with a friend and a cousin.  There were  was wet and damaged things to throw up and stuff  there though thought were in the house.  Therapist noted the positive of doing the job even though it was stressful thinking how it was getting get done now has to be concerned about garbage will not take it all will take it if it fits into the trash barrel now the thought is how to get it to the dump  but cost money. As we were talking patient said she is stretching her left leg can feel its bad in the knee area torn higher magnitude of pain now orthopedic specialist said pain will last more than a year more than 1 break patient  asked him to document so can get pain management.  On the other hand the pain management at orthopedics said not sure why foot toes are  still in pain it has been like this since December plan still is to switch over to [facility] for pain management therapist noted when the doctor said pain for a year that they will be an end date some point we will be nice to see her have a better quality of life.Other stressors found out some of hair  not going to grow back also has to buy products for the edges that cost a lot of money to take care of parts that will grow.  Patient says she thinks about losing her hair a lot she wears a bonnet on her head she is sick of it.  Therapist brainstormed wigs but those are expensive.  Another issue is denying meds from insurance for things like vertigo for acid reflux.  Talked about frustration of aid.  In the room while she is talking to therapist therapist noted patient needs her privacy not doing things in the house patient will report that.  Patient going to go to primary care daughter goes to positive she found a place at the end of session she went outside therapist noted can be nice in the morning to sit outside patient remembers when she used to smoke pot and notices didn't hurt like this but has not been on marijuana for over 5 years..   Patient has significant stressors therapist focused on positive developments things like finding a primary care that we will go to daughter [place] switching pain management over to [facility] to Dr. Who appears to be attentive to her.  Getting her things out of the shed.Therapist offered emotional support, active listening and validation of patient's emotional experience as appropriate during session.  Therapist helped patient process emotion noted important support therapist provide also focused on strengths and things patient accomplished therapist said it ended the session things were at a standstill and they seem at least now to be moving.  Suicidal/Homicidal: No  Plan: Return again in 2 weeks.2.  Work on ongoing coping of stressors and mental health symptoms  Diagnosis:  Major depressive disorder, recurrent, moderate, generalized anxiety disorder, chronic PTSD, panic attacks  Collaboration of Care: Other none needed  Patient/Guardian was advised Release of Information must be obtained prior to any record release in order to collaborate their care with an outside provider. Patient/Guardian was advised if they have not already done so to contact the registration department to sign all necessary forms in order for [name] to release information regarding their care.   Consent: Patient/Guardian gives verbal consent for treatment and assignment of benefits for services provided during this visit. Patient/Guardian expressed understanding and agreed to proceed.   [place] Breeze, [place] 04/25/2023

## 2023-04-28 ENCOUNTER — Ambulatory Visit (HOSPITAL_COMMUNITY)
Admission: RE | Admit: 2023-04-28 | Discharge: 2023-04-28 | Disposition: A | Discharge status: Home / Self Care | Source: Ambulatory Visit | Attending: Physician Assistant | Admitting: Physician Assistant

## 2023-04-28 ENCOUNTER — Other Ambulatory Visit (HOSPITAL_COMMUNITY): Payer: Self-pay | Admitting: Physician Assistant

## 2023-04-28 DIAGNOSIS — R109 Unspecified abdominal pain: Secondary | ICD-10-CM | POA: Diagnosis not present

## 2023-04-28 DIAGNOSIS — I878 Other specified disorders of veins: Secondary | ICD-10-CM | POA: Diagnosis not present

## 2023-04-28 DIAGNOSIS — R932 Abnormal findings on diagnostic imaging of liver and biliary tract: Secondary | ICD-10-CM

## 2023-04-28 DIAGNOSIS — R112 Nausea with vomiting, unspecified: Secondary | ICD-10-CM | POA: Diagnosis not present

## 2023-04-28 DIAGNOSIS — K5904 Chronic idiopathic constipation: Secondary | ICD-10-CM

## 2023-04-28 DIAGNOSIS — K7689 Other specified diseases of liver: Secondary | ICD-10-CM | POA: Diagnosis not present

## 2023-04-30 ENCOUNTER — Other Ambulatory Visit: Payer: Self-pay | Admitting: Internal Medicine

## 2023-04-30 DIAGNOSIS — A084 Viral intestinal infection, unspecified: Secondary | ICD-10-CM

## 2023-04-30 NOTE — Telephone Encounter (Signed)
 Copied from CRM [id number]. Topic: Clinical - Medication Refill >> Apr 30, 2023 11:33 AM Claudine Mouton wrote: Most Recent Primary Care Visit:  Provider: [name]  Department: [facility]  Visit Type: OPEN ESTABLISHED  Date: 02/12/2023  Medication: ondansetron (ZOFRAN-ODT) 8 MG disintegrating tablet [[id number]] meclizine (ANTIVERT) 25 MG tablet [[id number]]  Has the patient contacted their pharmacy? Yes (Agent: If no, request that the patient contact the pharmacy for the refill. If patient does not wish to contact the pharmacy document the reason why and proceed with request.) (Agent: If yes, when and what did the pharmacy advise?) Patient was directed back to Provider's office.   Is this the correct pharmacy for this prescription? Yes If no, delete pharmacy and type the correct one.  This is the patient's preferred pharmacy:   [id number] [place], [place] - [address] AT [facility] [address] [place] [address] Phone: [phone] Fax: [phone]  Has the prescription been filled recently? No  Is the patient out of the medication? Yes. Patient also   Has the patient been seen for an appointment in the last year OR does the patient have an upcoming appointment? Yes  Can we respond through MyChart? No  Agent: Please be advised that Rx refills may take up to 3 business days. We ask that you follow-up with your pharmacy.

## 2023-05-01 MED ORDER — MECLIZINE HCL 25 MG PO TABS: 25.0000 mg | ORAL_TABLET | Freq: Three times a day (TID) | ORAL | 1 refills | Status: AC | PRN

## 2023-05-01 MED ORDER — ONDANSETRON 8 MG PO TBDP: 8.0000 mg | ORAL_TABLET | Freq: Three times a day (TID) | ORAL | 0 refills | Status: AC | PRN

## 2023-05-02 ENCOUNTER — Other Ambulatory Visit: Payer: Self-pay | Admitting: Physician Assistant

## 2023-05-05 ENCOUNTER — Other Ambulatory Visit (INDEPENDENT_AMBULATORY_CARE_PROVIDER_SITE_OTHER)

## 2023-05-05 DIAGNOSIS — R932 Abnormal findings on diagnostic imaging of liver and biliary tract: Secondary | ICD-10-CM

## 2023-05-05 LAB — HEPATIC FUNCTION PANEL
ALT: 9 U/L (ref 0–35)
AST: 9 U/L (ref 0–37)
Albumin: 4.5 g/dL (ref 3.5–5.2)
Alkaline Phosphatase: 68 U/L (ref 39–117)
Bilirubin, Direct: 0.1 mg/dL (ref 0.0–0.3)
Total Bilirubin: 0.2 mg/dL (ref 0.2–1.2)
Total Protein: 7.6 g/dL (ref 6.0–8.3)

## 2023-05-05 LAB — CBC WITH DIFFERENTIAL/PLATELET
Basophils Absolute: 0.1 10*3/uL (ref 0.0–0.1)
Basophils Relative: 0.7 % (ref 0.0–3.0)
Eosinophils Absolute: 0.1 10*3/uL (ref 0.0–0.7)
Eosinophils Relative: 1.3 % (ref 0.0–5.0)
HCT: 37.2 % (ref 36.0–46.0)
Hemoglobin: 11.8 g/dL — ABNORMAL LOW (ref 12.0–15.0)
Lymphocytes Relative: 33.1 % (ref 12.0–46.0)
Lymphs Abs: 2.7 10*3/uL (ref 0.7–4.0)
MCHC: 31.7 g/dL (ref 30.0–36.0)
MCV: 82.7 fl (ref 78.0–100.0)
Monocytes Absolute: 0.7 10*3/uL (ref 0.1–1.0)
Monocytes Relative: 8.7 % (ref 3.0–12.0)
Neutro Abs: 4.6 10*3/uL (ref 1.4–7.7)
Neutrophils Relative %: 56.2 % (ref 43.0–77.0)
Platelets: 436 10*3/uL — ABNORMAL HIGH (ref 150.0–400.0)
RBC: 4.49 Mil/uL (ref 3.87–5.11)
RDW: 15.2 % (ref 11.5–15.5)
WBC: 8.1 10*3/uL (ref 4.0–10.5)

## 2023-05-05 LAB — BASIC METABOLIC PANEL WITH GFR
BUN: 13 mg/dL (ref 6–23)
CO2: 25 meq/L (ref 19–32)
Calcium: 9.5 mg/dL (ref 8.4–10.5)
Chloride: 106 meq/L (ref 96–112)
Creatinine, Ser: 0.73 mg/dL (ref 0.40–1.20)
GFR: 93.94 mL/min (ref >60.00)
Glucose, Bld: 100 mg/dL — ABNORMAL HIGH (ref 70–99)
Potassium: 4 meq/L (ref 3.5–5.1)
Sodium: 141 meq/L (ref 135–145)

## 2023-05-05 LAB — PROTIME-INR
INR: 1.1 ratio — ABNORMAL HIGH (ref 0.8–1.0)
Prothrombin Time: 11.9 s (ref 9.6–13.1)

## 2023-05-07 NOTE — Progress Notes (Unsigned)
 05/07/2023 [name] [id number] 11-28-1970  Referring provider: [name], MD Primary GI doctor: Dr. Leonides Schanz  ASSESSMENT AND PLAN:   Assessment and Plan          Abnormal abdominal ultrasound of liver 04/28/2023 RUQ Q for abdominal pain showed nodular contour of the liver suggestive of cirrhosis 08/10/2022 CT abdomen pelvis showed liver unremarkable spleen unremarkable Suspected metabolic dysfunction associated seatohepatitis (MASH, formerly NASH)  by history and ultrasound.  Must exclude other chronic causes of hepatocellular inflammation that can mimic fatty liver on ultrasound - Labs to include: hepatitis panel, iron, ferritin, TIBC,  IgG, ANA, Antismooth muscle antibody, AMA, celiac, thyroid, alpha-one-antitrypsin level -will get elastography - need LFTs and CBC monitored every 6 months, revaluation every 2-3 years.  -Continue to work on risk factor modification including diet exercise and control of risk factors including blood sugars.  Constipation with history of stool impaction Colonoscopy 2020 diverticulosis otherwise normal recall 2027 04/28/2023 KUB moderate large stool burden  Reflux with nausea vomiting On Ozempic, tried Prilosec increased 40 mg Instructed to stop NSAIDs Provided with gastroparesis diet  Anemia 05/05/2023  HGB 11.8 MCV 82.7 Platelets 436.0 12/03/2022 Iron 45 Ferritin 86.1 B12 446 Unremarkable iron ferritin but slight decreased saturation ratio will repeat this visit Recent Labs    08/07/22 1024 08/09/22 2024 08/10/22 0820 08/11/22 0223 08/19/22 1027 12/03/22 1014 01/12/23 1240 01/13/23 0341 02/25/23 1055 05/05/23 0856  HGB 11.4 10.1* 10.9* 9.3* 10.6* 11.3* 10.2* 10.1* 10.4* 11.8*     Patient Care Team: [name], MD as PCP - General (Internal Medicine) Rennis Golden Lisette Abu, MD as PCP - Cardiology (Cardiology)  HISTORY OF PRESENT ILLNESS: 53 y.o. female with a past medical history listed below presents for follow up  of imaging.    Discussed the use of AI scribe software for clinical note transcription with the patient, who gave verbal consent to proceed.  History of Present Illness            She  reports that she quit smoking about 3 years ago. Her smoking use included cigarettes. She started smoking about 28 years ago. She has a 2.5 pack-year smoking history. She has never used smokeless tobacco. She reports that she does not currently use alcohol. She reports that she does not currently use drugs after having used the following drugs: Marijuana. Frequency: 3.00 times per week.  RELEVANT GI HISTORY, IMAGING AND LABS: Results          Review of pertinent gastrointestinal problems: 1. Adenomatous polyps in her colon;   colonoscopy in 07/2007 with one polyp that was removed and was a hyperplastic polyp; also had hemorrhoids.  Colonoscopy  04/2015 Dr. Christella Hartigan (minor rectal bleeding); this was an incomplete colonoscopy 2 to BMI 50, I was only able to get to the ascending colon; 4 polyps were removed, 3 of them were adenomatous.  My office arranged barium enema but she never had that done. Colonoscopy 12/31/2018 with Dr. Christella Hartigan good bowel prep diverticulosis left colon no polyps or cancers recall 7 years 2.  Dyspepsia led to upper endoscopy May 2012.  Minor gastritis was noted, biopsies showed no sign of H. Pylori. Ultrasound June 2012 was normal.   HIDA scan June 2012 was also normal.   CT scan August 2012 was essentially normal except for "prominent stool"   08/10/2022 CT abdomen pelvis with contrast for abdominal pain shows mild diverticulosis without diverticulitis Initial CT and pelvis with right lower quadrant abdominal pain showed slightly enlarged appendix  without any acute inflammation, suggested repeat 12 to 24 hours 08/10/2022 repeat CT abdomen pelvis with contrast for right lower quadrant abdominal pain showed prominent stool throughout the colon favors constipation, normal appendix, suspected  2.1 cm right uterine fibroid and aortic atherosclerosis CBC    Component Value Date/Time   WBC 8.1 05/05/2023 0856   RBC 4.49 05/05/2023 0856   HGB 11.8 (L) 05/05/2023 0856   HGB 11.4 08/07/2022 1024   HCT 37.2 05/05/2023 0856   HCT 34.5 08/07/2022 1024   PLT 436.0 (H) 05/05/2023 0856   PLT 421 08/07/2022 1024   MCV 82.7 05/05/2023 0856   MCV 83 08/07/2022 1024   MCH 26.7 02/25/2023 1055   MCHC 31.7 05/05/2023 0856   RDW 15.2 05/05/2023 0856   RDW 14.3 08/07/2022 1024   LYMPHSABS 2.7 05/05/2023 0856   LYMPHSABS 2.0 08/07/2022 1024   MONOABS 0.7 05/05/2023 0856   EOSABS 0.1 05/05/2023 0856   EOSABS 0.2 08/07/2022 1024   BASOSABS 0.1 05/05/2023 0856   BASOSABS 0.0 08/07/2022 1024   Recent Labs    08/07/22 1024 08/09/22 2024 08/10/22 0820 08/11/22 0223 08/19/22 1027 12/03/22 1014 01/12/23 1240 01/13/23 0341 02/25/23 1055 05/05/23 0856  HGB 11.4 10.1* 10.9* 9.3* 10.6* 11.3* 10.2* 10.1* 10.4* 11.8*    CMP     Component Value Date/Time   NA 141 05/05/2023 0856   NA 142 08/07/2022 1024   K 4.0 05/05/2023 0856   CL 106 05/05/2023 0856   CO2 25 05/05/2023 0856   GLUCOSE 100 (H) 05/05/2023 0856   BUN 13 05/05/2023 0856   BUN 12 08/07/2022 1024   CREATININE 0.73 05/05/2023 0856   CREATININE 0.88 09/15/2013 1032   CALCIUM 9.5 05/05/2023 0856   PROT 7.6 05/05/2023 0856   PROT 6.7 08/07/2022 1024   ALBUMIN 4.5 05/05/2023 0856   ALBUMIN 4.3 08/07/2022 1024   AST 9 05/05/2023 0856   ALT 9 05/05/2023 0856   ALKPHOS 68 05/05/2023 0856   BILITOT 0.2 05/05/2023 0856   BILITOT <0.2 08/07/2022 1024   GFRNONAA 59 (L) 02/25/2023 1055   GFRAA 67 02/02/2020 0933      Latest Ref Rng & Units 05/05/2023    8:56 AM 01/12/2023   12:40 PM 12/03/2022   10:14 AM  Hepatic Function  Total Protein 6.0 - 8.3 g/dL 7.6  6.1  7.3   Albumin 3.5 - 5.2 g/dL 4.5  3.2  4.1   AST 0 - 37 U/L 9  12  6    ALT 0 - 35 U/L 9  8  6    Alk Phosphatase 39 - 117 U/L 68  60  54   Total Bilirubin 0.2 -  1.2 mg/dL 0.2  0.5  0.4   Bilirubin, Direct 0.0 - 0.3 mg/dL 0.1         Current Medications:   Current Outpatient Medications (Endocrine & Metabolic):    Semaglutide, 2 MG/DOSE, (OZEMPIC, 2 MG/DOSE,) 8 MG/3ML SOPN, INJECT 2 MG INTO THE SKIN ONCE A WEEK.  Current Outpatient Medications (Cardiovascular):    atorvastatin (LIPITOR) 40 MG tablet, TAKE 1 TABLET BY MOUTH EVERY DAY   ENTRESTO 24-26 MG, TAKE 1 TABLET BY MOUTH TWICE A DAY   metoprolol succinate (TOPROL-XL) 50 MG 24 hr tablet, Take 1 tablet (50 mg total) by mouth daily.   torsemide (DEMADEX) 20 MG tablet, TAKE 1 TABLET BY MOUTH TWICE A DAY  Current Outpatient Medications (Respiratory):    albuterol (PROVENTIL) (2.5 MG/3ML) 0.083% nebulizer solution, Take 3 mLs (2.5  mg total) by nebulization every 6 (six) hours as needed for wheezing or shortness of breath.   albuterol (VENTOLIN HFA) 108 (90 Base) MCG/ACT inhaler, TAKE 2 PUFFS BY MOUTH EVERY 6 HOURS AS NEEDED FOR WHEEZE OR SHORTNESS OF BREATH   Azelastine HCl 137 MCG/SPRAY SOLN, PLACE 1 SPRAY INTO BOTH NOSTRILS 2 (TWO) TIMES DAILY. USE IN EACH NOSTRIL AS DIRECTED   cetirizine (ZYRTEC) 10 MG tablet, Take 1 tablet (10 mg total) by mouth daily.   fluticasone (FLONASE) 50 MCG/ACT nasal spray, SPRAY 1 SPRAY INTO EACH NOSTRIL DAILY   ipratropium (ATROVENT) 0.06 % nasal spray, PLEASE SEE ATTACHED FOR DETAILED DIRECTIONS   promethazine (PHENERGAN) 25 MG tablet, TAKE 1 TABLET BY MOUTH EVERY 6 HOURS AS NEEDED.  Current Outpatient Medications (Analgesics):    acetaminophen (TYLENOL) 500 MG tablet, Take 1 tablet (500 mg total) by mouth every 6 (six) hours as needed.   ibuprofen (ADVIL) 200 MG tablet, Take 2 tablets (400 mg total) by mouth every 8 (eight) hours as needed for moderate pain (pain score 4-6).   oxyCODONE (OXY IR/ROXICODONE) 5 MG immediate release tablet, Take by mouth.   Current Outpatient Medications (Other):    ACCU-CHEK GUIDE test strip, USE ONE TEST STRIP TO CHECK BLOOD  SUGARS ONCE A DAY AS NEEDED   Accu-Chek Softclix Lancets lancets, USE ONE LANCET TO CHECK BLOOD SUGAR ONE A DAY AS NEEDED   Blood Glucose Monitoring Suppl (ACCU-CHEK GUIDE ME) w/Device KIT, Dispense one device   Brexpiprazole (REXULTI) 3 MG TABS, Take 1 tablet (3 mg total) by mouth daily.   dicyclomine (BENTYL) 10 MG capsule, TAKE 2 CAPSULES (20 MG TOTAL) BY MOUTH 3 (THREE) TIMES DAILY AS NEEDED FOR SPASMS (ABDOMINAL PAIN).   doxepin (SINEQUAN) 25 MG capsule, Take 1 capsule (25 mg total) by mouth at bedtime.   lamoTRIgine (LAMICTAL) 100 MG tablet, Take 0.5 tablets (50 mg total) by mouth 2 (two) times daily.   linaclotide (LINZESS) 290 MCG CAPS capsule, Take 1 capsule (290 mcg total) by mouth daily before breakfast.   meclizine (ANTIVERT) 25 MG tablet, Take 1 tablet (25 mg total) by mouth 3 (three) times daily as needed for dizziness.   megestrol (MEGACE) 40 MG tablet, Take 2 tablets (80 mg total) by mouth daily.   omeprazole (PRILOSEC) 40 MG capsule, TAKE 1 CAPSULE (40 MG TOTAL) BY MOUTH DAILY.   ondansetron (ZOFRAN-ODT) 8 MG disintegrating tablet, Take 1 tablet (8 mg total) by mouth every 8 (eight) hours as needed.   Safety Seal Miscellaneous MISC, Apply 1 Application topically in the morning. Medication Name; AA Gel   tiZANidine (ZANAFLEX) 4 MG tablet, TAKE 1 TABLET (4 MG TOTAL) BY MOUTH EVERY 8 (EIGHT) HOURS AS NEEDED FOR MUSCLE SPASMS  Medical History:  Past Medical History:  Diagnosis Date   Acid reflux    Anemia    Iron Def   Anorexia    CHF (congestive heart failure) (HCC)    Chronic kidney disease    Nephrotic syndrome   Colon polyp 2009   Depression with anxiety    Edema leg    Fibroid tumor 04/2022   Fibromyalgia    Hemorrhoids    Hidradenitis suppurativa    Hypertension    IBS (irritable bowel syndrome)    Low back pain    Migraines    Morbidly obese (HCC)    Neuromuscular disorder (HCC)    fibromyalgia   Neuropathy    Panic attacks    Polyp of vocal cord or larynx  Stroke [facility])    Tonsil pain    Allergies:  Allergies  Allergen Reactions   Lisinopril Rash and Cough     Surgical History:  She  has a past surgical history that includes Hemorrhoid surgery; Upper gastrointestinal endoscopy; Axillary hidradenitis excision; Inguinal hidradenitis excision; Tonsillectomy (10/18/2010); Colonoscopy with propofol (N/A, 05/25/2015); and Colonoscopy with propofol (N/A, 12/31/2018). Family History:  Her family history includes Anxiety disorder in her mother and sister; Cancer in her father; Depression in her mother, sister, and sister; Diabetes in her mother; Heart disease in her father and mother; High blood pressure in her mother; Kidney failure in her mother; Learning disabilities in her sister.  REVIEW OF SYSTEMS  : All other systems reviewed and negative except where noted in the History of Present Illness.  PHYSICAL EXAM: There were no vitals taken for this visit. Physical Exam          Doree Albee, PA-C 12:26 PM

## 2023-05-08 ENCOUNTER — Ambulatory Visit: Admitting: Physician Assistant

## 2023-05-08 ENCOUNTER — Encounter: Payer: Self-pay | Admitting: Physician Assistant

## 2023-05-08 ENCOUNTER — Other Ambulatory Visit (INDEPENDENT_AMBULATORY_CARE_PROVIDER_SITE_OTHER)

## 2023-05-08 VITALS — BP 126/80 | HR 104 | Ht 68.0 in | Wt 319.5 lb

## 2023-05-08 DIAGNOSIS — J449 Chronic obstructive pulmonary disease, unspecified: Secondary | ICD-10-CM

## 2023-05-08 DIAGNOSIS — D649 Anemia, unspecified: Secondary | ICD-10-CM

## 2023-05-08 DIAGNOSIS — R112 Nausea with vomiting, unspecified: Secondary | ICD-10-CM

## 2023-05-08 DIAGNOSIS — Z6841 Body Mass Index (BMI) 40.0 and over, adult: Secondary | ICD-10-CM

## 2023-05-08 DIAGNOSIS — R140 Abdominal distension (gaseous): Secondary | ICD-10-CM

## 2023-05-08 DIAGNOSIS — K219 Gastro-esophageal reflux disease without esophagitis: Secondary | ICD-10-CM | POA: Diagnosis not present

## 2023-05-08 DIAGNOSIS — Z87891 Personal history of nicotine dependence: Secondary | ICD-10-CM

## 2023-05-08 DIAGNOSIS — R109 Unspecified abdominal pain: Secondary | ICD-10-CM | POA: Diagnosis not present

## 2023-05-08 DIAGNOSIS — R932 Abnormal findings on diagnostic imaging of liver and biliary tract: Secondary | ICD-10-CM | POA: Diagnosis not present

## 2023-05-08 DIAGNOSIS — G4733 Obstructive sleep apnea (adult) (pediatric): Secondary | ICD-10-CM | POA: Diagnosis not present

## 2023-05-08 DIAGNOSIS — K5900 Constipation, unspecified: Secondary | ICD-10-CM | POA: Diagnosis not present

## 2023-05-08 DIAGNOSIS — K5904 Chronic idiopathic constipation: Secondary | ICD-10-CM

## 2023-05-08 LAB — CERULOPLASMIN: Ceruloplasmin: 28.6 mg/dL (ref 19.0–39.0)

## 2023-05-08 LAB — IBC + FERRITIN
Ferritin: 48.6 ng/mL (ref 10.0–291.0)
Iron: 28 ug/dL — ABNORMAL LOW (ref 42–145)
Saturation Ratios: 10.3 % — ABNORMAL LOW (ref 20.0–50.0)
TIBC: 271.6 ug/dL (ref 250.0–450.0)
Transferrin: 194 mg/dL — ABNORMAL LOW (ref 212.0–360.0)

## 2023-05-08 LAB — ANTI-SMOOTH MUSCLE ANTIBODY, IGG: Smooth Muscle Ab: 7 U (ref 0–19)

## 2023-05-08 LAB — IGA: IgA/Immunoglobulin A, Serum: 98 mg/dL (ref 87–352)

## 2023-05-08 LAB — IGG: IgG (Immunoglobin G), Serum: 1164 mg/dL (ref 586–1602)

## 2023-05-08 LAB — HEPATITIS A ANTIBODY, TOTAL: hep A Total Ab: NEGATIVE

## 2023-05-08 LAB — HEPATITIS B SURFACE ANTIBODY,QUALITATIVE: Hep B Surface Ab, Qual: NONREACTIVE

## 2023-05-08 LAB — HEPATITIS C ANTIBODY: Hep C Virus Ab: NONREACTIVE

## 2023-05-08 LAB — HEPATITIS B SURFACE ANTIGEN: Hepatitis B Surface Ag: NEGATIVE

## 2023-05-08 LAB — TISSUE TRANSGLUTAMINASE, IGA

## 2023-05-08 LAB — ANA: Anti Nuclear Antibody (ANA): NEGATIVE

## 2023-05-08 LAB — ALPHA-1-ANTITRYPSIN: A-1 Antitrypsin: 169 mg/dL (ref 101–187)

## 2023-05-08 LAB — MITOCHONDRIAL ANTIBODIES: Mitochondrial Ab: <20 U (ref 0.0–20.0)

## 2023-05-08 NOTE — Progress Notes (Signed)
 I agree with the assessment and plan as outlined by Ms. Steffanie Dunn.

## 2023-05-08 NOTE — Patient Instructions (Addendum)
 Your provider has requested that you go to the basement level for lab work before leaving today. Press "B" on the elevator. The lab is located at the first door on the left as you exit the elevator.  Please do the following: Purchase a bottle of Miralax over the counter as well as a box of 5 mg dulcolax tablets. Take 4 dulcolax tablets. Wait 1 hour. You will then drink 6-8 capfuls of Miralax mixed in an adequate amount of water/juice/gatorade (you may choose which of these liquids to drink) over the next 2-3 hours. You should expect results within 1 to 6 hours after completing the bowel purge. Go to the er if you have severe AB pain, can not pass gas or stool in over 12 hours, can not hold down any food.   Linzess  *IBS-C patients may begin to experience relief from belly pain and overall abdominal symptoms (pain, discomfort, and bloating) in about 1 week,  with symptoms typically improving over 12 weeks.  Take at least 30 minutes before the first meal of the day on an empty stomach You can have a loose stool if you eat a high-fat breakfast. Give it at least 7 days, may have more bowel movements during that time.   The diarrhea should go away and you should start having normal, complete, full bowel movements.  It may be helpful to start treatment when you can be near the comfort of your own bathroom, such as a weekend.  After you are out we can send in a prescription if you did well, there is a prescription card  Metabolic dysfunction associated seatohepatitis  Now the leading cause of liver failure in the [place].  It is normally from such risk factors as obesity, diabetes, insulin resistance, high cholesterol, or metabolic syndrome.  The only definitive therapy is weight loss and exercise.   Suggest walking 20-30 mins daily.  Decreasing carbohydrates, increasing veggies.    Fatty Liver Fatty liver is the accumulation of fat in liver cells. It is also called hepatosteatosis or  steatohepatitis. It is normal for your liver to contain some fat. If fat is more than 5 to 10% of your liver's weight, you have fatty liver.  There are often no symptoms (problems) for years while damage is still occurring. People often learn about their fatty liver when they have medical tests for other reasons. Fat can damage your liver for years or even decades without causing problems. When it becomes severe, it can cause fatigue, weight loss, weakness, and confusion. This makes you more likely to develop more serious liver problems. The liver is the largest organ in the body. It does a lot of work and often gives no warning signs when it is sick until late in a disease. The liver has many important jobs including: Breaking down foods. Storing vitamins, iron, and other minerals. Making proteins. Making bile for food digestion. Breaking down many products including medications, alcohol and some poisons.  PROGNOSIS  Fatty liver may cause no damage or it can lead to an inflammation of the liver. This is, called steatohepatitis.  Over time the liver may become scarred and hardened. This condition is called cirrhosis. Cirrhosis is serious and may lead to liver failure or cancer. NASH is one of the leading causes of cirrhosis. About 10-20% of Americans have fatty liver and a smaller 2-5% has NASH.  TREATMENT  Weight loss, fat restriction, and exercise in overweight patients produces inconsistent results but is worth trying. Good  control of diabetes may reduce fatty liver. Eat a balanced, healthy diet. Increase your physical activity. There are no medical or surgical treatments for a fatty liver or NASH, but improving your diet and increasing your exercise may help prevent or reverse some of the damage.

## 2023-05-09 ENCOUNTER — Ambulatory Visit (INDEPENDENT_AMBULATORY_CARE_PROVIDER_SITE_OTHER): Payer: 59 | Admitting: Licensed Clinical Social Worker

## 2023-05-09 ENCOUNTER — Telehealth: Payer: Self-pay

## 2023-05-09 ENCOUNTER — Other Ambulatory Visit: Payer: Self-pay

## 2023-05-09 ENCOUNTER — Ambulatory Visit: Payer: Self-pay | Admitting: Internal Medicine

## 2023-05-09 DIAGNOSIS — F331 Major depressive disorder, recurrent, moderate: Secondary | ICD-10-CM | POA: Diagnosis not present

## 2023-05-09 DIAGNOSIS — D509 Iron deficiency anemia, unspecified: Secondary | ICD-10-CM

## 2023-05-09 DIAGNOSIS — F410 Panic disorder [episodic paroxysmal anxiety] without agoraphobia: Secondary | ICD-10-CM | POA: Diagnosis not present

## 2023-05-09 DIAGNOSIS — F4312 Post-traumatic stress disorder, chronic: Secondary | ICD-10-CM | POA: Diagnosis not present

## 2023-05-09 DIAGNOSIS — F411 Generalized anxiety disorder: Secondary | ICD-10-CM

## 2023-05-09 NOTE — Telephone Encounter (Signed)
 [name] [address] [place], [place]  [address] Phone:  [phone]   Fax:  [phone] May 09, 2023     Lindsay [name] [address] [place] [address]       Dear Dr. Sharyn Lull,   We have scheduled the above named patient for an EGD & Colonoscopy on 07/14/23 at [facility] with Dr. Leonides Schanz. Given her cardiac history, please advise on cardiac clearance prior to procedure date.     Please route your response to Sharyon Medicus, RN or fax response to [phone]     Sincerely,   [facility]

## 2023-05-09 NOTE — Progress Notes (Signed)
 Technical problems more than 50% of session spent on phone  Virtual Visit via Video Note  I connected with Lindsay [name] on 05/09/23 at  8:00 AM EDT by a video enabled telemedicine application and verified that I am speaking with the correct person using two identifiers.  Location: Patient: home Provider: home office   I discussed the limitations of evaluation and management by telemedicine and the availability of in person appointments. The patient expressed understanding and agreed to proceed.   I discussed the assessment and treatment plan with the patient. The patient was provided an opportunity to ask questions and all were answered. The patient agreed with the plan and demonstrated an understanding of the instructions.   The patient was advised to call back or seek an in-person evaluation if the symptoms worsen or if the condition fails to improve as anticipated.  I provided 45 minutes of non-face-to-face time during this encounter.  THERAPIST PROGRESS NOTE  Session Time: 8:00 AM to 8:45 AM  Participation Level: Active  Behavioral Response: CasualAlertAnxious  Type of Therapy: Individual Therapy  Treatment Goals addressed: : patient continue to utilize therapy for  supportive and strength-based interventions, provide treatment interventions in the context of patient continuing to seek help and cope with medical issues, anxiety, triggers, coping with mental health symptoms, trauma, utilize therapy as a way for patient to focus on working through current stressors that also helps to distract from pain  ProgressTowards Goals: Progressing-patient has been dealing with significant stressors recently had increase even more stressors process thoughts is in session increase sessions to weekly  Interventions: Solution Focused, Strength-based, Supportive, and Other: coping  Summary: Lindsay [name] is a 53 y.o. female who presents with has an x-ray and ultrasound of abdomen thought could  be cancer it is fatty liver. The doctor is going to monitor it and take more tests. Has to have endoscopy colonoscopy patient has three breathing problems concern with the anesthesia need clearance from pulmonary doctor so referral to them. Patient has iron deficiency. For above procedures also need cleared by heart doctor. Patient points out that being cleared doesn't mean no issue. Had blood work due to iron deficiency, the blood work has a lot of abnormal results. Forgot what doctor said remembered when telling her probably should have recorded it. Sent doctor a message to explain who to call to make appointments?. She is the follow up for the blood work. Overwhelmed. Property management text not happy with things sitting in yard city won't take all have to put in the garage over time. Need to come in and make sure vent not covered see how much stuff in house looking to see if house up to standards. Knocked whole shed down what else could they do with their stuff? Inspector wanted everything repaired he was an "asshole". The repairs make it hard for them. Dr. Lolly Mustache doesn't want to give patient a panic pill that is crazy to patient. Keep appointments with therapist. All breathing techniques can't focus dealing with real stuff. Feels helps to just let therapist know what is going on. Nobody else can tell nobody else trust. Told sister Lindsay [name]. She is the emergency contact. Told her put name down just yesterday. She said glad told somebody needs to know.  Therapist glad she told her to didn't tell to yesterday March 31 to yesterday patient dealing with this on her own. Niece graduating doing sign language. Wants to be psychiatrist.  Scary nodular looked up sitting on liver could turn into  cancer. Scary thinking about this to patient.   Noted patient has increased significant stressors concern about medical issues additional ones from when she has had assess helpful for her to talk it out helps with coping balance  that therapist therapist reorient to help her in ways to cope with being overwhelmed.  Stressed about all the medical issues she has to take care of therapist noted take things 1 at a time communicate with Dr. Stann Mainland be helpful when she does not understand something and she has been doing that.  Assess helpful to have weekly sessions with increase in stressors validated patient on some of the things anxious about therapist providing supportive interventions. therapists use CBT to ask the client open-ended questions about the source of her anxiety therapist use CBT to engage with the client and normalize her emotions compared to the stressor within reason. Suicidal/Homicidal: No  Plan: Return again in 2 weeks.2.Work on ongoing coping of stressors and mental health symptoms   Diagnosis: Major depressive disorder, recurrent, moderate, generalized anxiety disorder, chronic PTSD, panic attacks   Collaboration of Care: Other none needed  Patient/Guardian was advised Release of Information must be obtained prior to any record release in order to collaborate their care with an outside provider. Patient/Guardian was advised if they have not already done so to contact the registration department to sign all necessary forms in order for [name] to release information regarding their care.   Consent: Patient/Guardian gives verbal consent for treatment and assignment of benefits for services provided during this visit. Patient/Guardian expressed understanding and agreed to proceed.   Coolidge Breeze, LCSW 05/09/2023

## 2023-05-09 NOTE — Telephone Encounter (Signed)
 Chief Complaint: Shortness of breath x2 months  Symptoms: "can't really take a good deep breath in" Pertinent Negatives: Patient denies chest pain, wheezing  Disposition: [x] ED   Additional Notes: Pt is out of symbicort and needs a refill. Pt is on 3L of oxygen. This RN advised pt to go to ED and have another adult drive her. Pt states understanding and is agreeable to plan.    Copied from CRM [id number]. Topic: Clinical - Red Word Triage >> May 09, 2023  3:47 PM Gaetano Hawthorne wrote: Red Word that prompted transfer to Nurse Triage: Patient has been experiencing shortness of breath for the past 2 months - Patient of Dr. Suszanne Conners at [facility] office - patient made a follow up for a different purpose in June. Reason for Disposition  [1] MODERATE difficulty breathing (e.g., speaks in phrases, SOB even at rest, pulse 100-120) AND [2] NEW-onset or WORSE than normal  Answer Assessment - Initial Assessment Questions RESPIRATORY STATUS: "Describe your breathing?" (e.g., wheezing, shortness of breath, unable to speak, severe coughing)      Shortness of breath ONSET: "When did this breathing problem begin?"      x2 months PATTERN "Does the difficult breathing come and go, or has it been constant since it started?"      Constant SEVERITY: "How bad is your breathing?" (e.g., mild, moderate, severe)    - MILD: No SOB at rest, mild SOB with walking, speaks normally in sentences, can lie down, no retractions, pulse < 100.    - MODERATE: SOB at rest, SOB with minimal exertion and prefers to sit, cannot lie down flat, speaks in phrases, mild retractions, audible wheezing, pulse 100-120.    - SEVERE: Very SOB at rest, speaks in single words, struggling to breathe, sitting hunched forward, retractions, pulse > 120      Severe O2 SATURATION MONITOR:  "Do you use an oxygen saturation monitor (pulse oximeter) at home?" If Yes, ask: "What is your reading (oxygen level) today?" "What is your usual oxygen saturation  reading?" (e.g., 95%)       Denies being able to check it now  Protocols used: Breathing Difficulty-A-AH

## 2023-05-12 ENCOUNTER — Encounter: Payer: Self-pay | Admitting: Pulmonary Disease

## 2023-05-12 DIAGNOSIS — M79672 Pain in left foot: Secondary | ICD-10-CM | POA: Diagnosis not present

## 2023-05-14 ENCOUNTER — Ambulatory Visit (HOSPITAL_COMMUNITY): Admitting: Licensed Clinical Social Worker

## 2023-05-14 DIAGNOSIS — F411 Generalized anxiety disorder: Secondary | ICD-10-CM

## 2023-05-14 DIAGNOSIS — G894 Chronic pain syndrome: Secondary | ICD-10-CM | POA: Diagnosis not present

## 2023-05-14 DIAGNOSIS — J4540 Moderate persistent asthma, uncomplicated: Secondary | ICD-10-CM | POA: Diagnosis not present

## 2023-05-14 DIAGNOSIS — D75838 Other thrombocytosis: Secondary | ICD-10-CM | POA: Diagnosis not present

## 2023-05-14 DIAGNOSIS — I5022 Chronic systolic (congestive) heart failure: Secondary | ICD-10-CM | POA: Diagnosis not present

## 2023-05-14 DIAGNOSIS — I110 Hypertensive heart disease with heart failure: Secondary | ICD-10-CM | POA: Diagnosis not present

## 2023-05-14 DIAGNOSIS — Z0001 Encounter for general adult medical examination with abnormal findings: Secondary | ICD-10-CM | POA: Diagnosis not present

## 2023-05-14 DIAGNOSIS — D509 Iron deficiency anemia, unspecified: Secondary | ICD-10-CM | POA: Diagnosis not present

## 2023-05-14 DIAGNOSIS — F410 Panic disorder [episodic paroxysmal anxiety] without agoraphobia: Secondary | ICD-10-CM

## 2023-05-14 DIAGNOSIS — E1165 Type 2 diabetes mellitus with hyperglycemia: Secondary | ICD-10-CM | POA: Diagnosis not present

## 2023-05-14 DIAGNOSIS — F331 Major depressive disorder, recurrent, moderate: Secondary | ICD-10-CM | POA: Diagnosis not present

## 2023-05-14 DIAGNOSIS — F4312 Post-traumatic stress disorder, chronic: Secondary | ICD-10-CM

## 2023-05-14 DIAGNOSIS — G43009 Migraine without aura, not intractable, without status migrainosus: Secondary | ICD-10-CM | POA: Diagnosis not present

## 2023-05-14 NOTE — Progress Notes (Signed)
 Virtual Visit via Video Note  I connected with Loriel Dulany on 05/14/23 at 11:00 AM EDT by a video enabled telemedicine application and verified that I am speaking with the correct person using two identifiers.  Location: Patient: home Provider: home office   I discussed the limitations of evaluation and management by telemedicine and the availability of in person appointments. The patient expressed understanding and agreed to proceed.   I discussed the assessment and treatment plan with the patient. The patient was provided an opportunity to ask questions and all were answered. The patient agreed with the plan and demonstrated an understanding of the instructions.   The patient was advised to call back or seek an in-person evaluation if the symptoms worsen or if the condition fails to improve as anticipated.  I provided 45 minutes of non-face-to-face time during this encounter.  THERAPIST PROGRESS NOTE  Session Time: 11:00 AM to 11:45 AM  Participation Level: Active  Behavioral Response: CasualAlertAnxious  Type of Therapy: Individual Therapy  Treatment Goals addressed: patient continue to utilize therapy for  supportive and strength-based interventions, provide treatment interventions in the context of patient continuing to seek help and cope with medical issues, anxiety, triggers, coping with mental health symptoms, trauma, utilize therapy as a way for patient to focus on working through current stressors that also helps to distract from pain  ProgressTowards Goals: Progressing-patient has had an increase of stressors with new medical issues assess helpful to have more regular appointments weekly helpful for her to have an outlet to talk about her stress helps with coping therapist also providing strength-based and supportive intervention.  Interventions: CBT, Solution Focused, Strength-based, Supportive, and Other: coping  Summary: Arpi Diebold is a 53 y.o. female who presents  with procedures and tests coming up scary to her. Endoscopy and colonoscopy for internal bleeding. Sees pulmonary June 11 for clearance  procedure June 16, April 25 will see heart doctor but needs to switch that. So many stuff, property manager has to come for issues related to inspection. One of organs inflamed something about blood work not right organ inflamed and bleeding somewhere. Just came from doctor asking for them to send the records but not asking her to sign a release. Supposedly the woman was a doctor and but a man  telling her where right or wrong no time for that. Therapist understands she needs to make sure she has experienced doctor. Problem with finding a PCP. Patient said was laying there when get up taste of blood in mouth.  After she went to the doctors said internal bleeding.  Therapist noted both positive and negative good to know being looked at at the same time the testing scares her. Testing through GI doctor. Forget what psychiatrist said but needs something for stress. Therapist does help "soften the blow". Therapist noted she had enough medical issues and now more. Afraid of what's happening clearance doesn't make her feel better anesthesia could stop breathing not come out of it. Careful of trusting doctors and therapist pointed out sometimes don't have a choice. Can't watch movies because of what is going on distraction now working. Right now only people talk to is therapist and sister. Friend has not been calling not saying anything not saying anything to patient. If she comes around patient over it, when she starts talking patient won't any time for her. Not calling the way she used to. Haven't heard since last Thursday text her to see if ok. I'm fine thank you for answering.  After all have done for her. Pray, not like should. Watch live broadcasts for sermons even distracted when doing that thinking about her health when services going on. So far away from where should be with  spirituality. Focused on the health part. Does get outside. Therapist said helps patient says depends what doing. Air quality not good don't go out there. Cook yesterday hasn't cooked like would like cooking everyday. Knee, back toes top of foot all causing pain. Ortho for foot not doing any good. Wait for MRI get done switch take it from there to pain specialist she has seen to manage all pain stuff. That doctor is more thorough. It is overwhelming patient says something always going on with her and therapist agrees. Face itching something in eye. Her scalp said inflamed outside and underneath. Bumps where hair and where not hair feels like glass in scalp hurts. Cream $48 has to wait to have money to send for it. Patient said if too much stuff in house may have to move. Wants to order a storage pod get money for that for when they inspect house. They are going to 25th. Rat pack storage unit portable. Haven't looked at it but overwhelmed with health have to deal with moving stuff or will ask them to this leave. Stuff in yard complaint but owner had to come up with money out of nowhere to haul it off.  Therapist impression he is able to do those kind of things therapist noted has been waiting for it to get easier then just got harder.    .  Assess therapy right now really helpful as stressors have been added to patient more medical issues were before what she was dealing with was enough feels overwhelmed every direction she turns there is another stressor.  Patient shares therapy helps soften the below.  Therapist guides patient and think about things in terms of guidance by doctors what is the best option and also recognize even though they do not always make the best call they are the experts no more than's.  Therapist also encouraged patient to consider possibilities with new medical issues could be different things not to go to the worst.  Noted from therapist on health issues it is easy to go to the worst hard  to distract the issue is where afraid we do not know what can happen in the unknown talked about distraction can help but difficult.  Noted patient goes outside therapist pointed out that can be helpful although not solving all the stress she is encountering.  Talked about struggles to get stuff out of the house or landlord will have the move coming on 25th to inspect therapist pointed out they took away her shack and then had no other options put it on her not seeming fair.  Therapist encouraged patient to prioritize this as that is the first thing coming up to deal with the medical issues or further out taking 1 things at a time and therapist also reminded patient of how impressed she is by her all these obstacles that seem very challenging has seen patient worked through them indicating her skills and strengths.  Also read a little passage from [place] to talk about how or biggest struggles are also what creates resilience creation for life Best qualities of her character but we have to get through them and hard when you are in the middle validating where patient is right now with things.  This provided space and support for patient  to talk about thoughts and feelings in session. Suicidal/Homicidal: No  Plan: Return again in 1-2 weeks.2.Work on ongoing coping of stressors and mental health symptoms  Diagnosis: Major depressive disorder, recurrent, moderate, generalized anxiety disorder, chronic PTSD, panic attacks  Collaboration of Care: Other none needed  Patient/Guardian was advised Release of Information must be obtained prior to any record release in order to collaborate their care with an outside provider. Patient/Guardian was advised if they have not already done so to contact the registration department to sign all necessary forms in order for us  to release information regarding their care.   Consent: Patient/Guardian gives verbal consent for treatment and assignment of benefits for services  provided during this visit. Patient/Guardian expressed understanding and agreed to proceed.   Dallie Duel, LCSW 05/14/2023

## 2023-05-15 ENCOUNTER — Other Ambulatory Visit: Payer: Self-pay

## 2023-05-15 ENCOUNTER — Telehealth: Payer: Self-pay | Admitting: Internal Medicine

## 2023-05-15 MED ORDER — PROMETHAZINE HCL 25 MG PO TABS: 25.0000 mg | ORAL_TABLET | Freq: Four times a day (QID) | ORAL | 0 refills | Status: DC | PRN

## 2023-05-15 NOTE — Telephone Encounter (Signed)
 Paperwork to be address at her visit:  Name: Lindsay [name], Dante MRN: [id number]  Date: 05/23/2023 Status: Sch  Time: 9:15 AM Length: 30  Visit Type: OFFICE VISIT [8002] Copay: $0.00  Provider: Malen Scudder, DO Department: [facility]  Referring Provider: Lasandra Points CSN: [id number]  Notes: DME F/U is due for 60 month O2 f/u AND A COMMODE please perform 3 qualification stats and sign form     Copied from CRM [id number]. Topic: General - Other >> May 14, 2023  4:53 PM Adrianna P wrote: Reason for CRM: [employer] called about faxed sent over on 4/11 please reach back out to them at [facility]: [phone]. Fax: [phone]

## 2023-05-15 NOTE — Telephone Encounter (Signed)
Prescription sent to pharmacy.  Pt made aware.

## 2023-05-15 NOTE — Telephone Encounter (Signed)
 Pt made aware to keep pulmonary OV in June prior to procedure & we will send for clearance closer to the time as well. She did request further refills for phenergan. States she is almost out, she is taking it every 6-8 hours consistently, and will run out this weekend.

## 2023-05-15 NOTE — Telephone Encounter (Signed)
 Received fax with copy of cardiac clearance letter from Dr. Glena Landau stating "patient acceptable risk for above procedure from cardiac point of view." Letter will be uploaded to media.

## 2023-05-20 ENCOUNTER — Telehealth: Payer: Self-pay | Admitting: Internal Medicine

## 2023-05-20 NOTE — Telephone Encounter (Signed)
 We have rec'd this form multiple times and have spoken with [employer] again today.    The patient must be seen 1st and is sch on the following day, after she has been seen the form will be addressed.    MRN: [id number]  Date: 05/23/2023 Status: Sch  Time: 9:15 AM Length: 30  Visit Type: OFFICE VISIT [8002] Copay: $0.00  Provider: Malen Scudder, DO     [employer] will call to the inform the patient again of the process and will check back next week.        Copied from CRM [id number]. Topic: General - Other >> May 20, 2023  8:38 AM Blair Bumpers wrote: Reason for CRM: Erby Hatcher, with [employer], calling to check if the faxes they sent over on 04/11, 17th, & 21st has been received? She states it is paperwork to recertify the patient's oxygent equipment. Called [employer] & spoke with Em and she stated that the person who's going through the emails will print them off today if it was sent yesterday. Delmon Ferretti of this information. She would like a call back once the faxes has been printed. She states they are awaiting the signature from the provider. CB #: [id number].

## 2023-05-21 ENCOUNTER — Other Ambulatory Visit: Payer: Self-pay | Admitting: Physician Assistant

## 2023-05-21 DIAGNOSIS — M47814 Spondylosis without myelopathy or radiculopathy, thoracic region: Secondary | ICD-10-CM | POA: Diagnosis not present

## 2023-05-21 DIAGNOSIS — K589 Irritable bowel syndrome without diarrhea: Secondary | ICD-10-CM

## 2023-05-21 DIAGNOSIS — S83232A Complex tear of medial meniscus, current injury, left knee, initial encounter: Secondary | ICD-10-CM | POA: Diagnosis not present

## 2023-05-21 DIAGNOSIS — M23022 Cystic meniscus, posterior horn of medial meniscus, left knee: Secondary | ICD-10-CM | POA: Diagnosis not present

## 2023-05-21 DIAGNOSIS — S83282A Other tear of lateral meniscus, current injury, left knee, initial encounter: Secondary | ICD-10-CM | POA: Diagnosis not present

## 2023-05-22 DIAGNOSIS — M47894 Other spondylosis, thoracic region: Secondary | ICD-10-CM | POA: Diagnosis not present

## 2023-05-22 DIAGNOSIS — M47816 Spondylosis without myelopathy or radiculopathy, lumbar region: Secondary | ICD-10-CM | POA: Diagnosis not present

## 2023-05-22 DIAGNOSIS — M25562 Pain in left knee: Secondary | ICD-10-CM | POA: Diagnosis not present

## 2023-05-22 DIAGNOSIS — M546 Pain in thoracic spine: Secondary | ICD-10-CM | POA: Diagnosis not present

## 2023-05-22 DIAGNOSIS — G8929 Other chronic pain: Secondary | ICD-10-CM | POA: Diagnosis not present

## 2023-05-23 ENCOUNTER — Encounter (HOSPITAL_COMMUNITY): Payer: Self-pay | Admitting: *Deleted

## 2023-05-23 ENCOUNTER — Ambulatory Visit: Payer: Self-pay | Admitting: Internal Medicine

## 2023-05-23 ENCOUNTER — Emergency Department (HOSPITAL_COMMUNITY)

## 2023-05-23 ENCOUNTER — Ambulatory Visit (HOSPITAL_COMMUNITY): Payer: 59 | Admitting: Licensed Clinical Social Worker

## 2023-05-23 ENCOUNTER — Other Ambulatory Visit: Payer: Self-pay

## 2023-05-23 ENCOUNTER — Inpatient Hospital Stay (HOSPITAL_COMMUNITY)
Admission: EM | Admit: 2023-05-23 | Discharge: 2023-05-25 | DRG: 191 | Disposition: A | Discharge status: Home / Self Care | Attending: Internal Medicine | Admitting: Internal Medicine

## 2023-05-23 DIAGNOSIS — Z87441 Personal history of nephrotic syndrome: Secondary | ICD-10-CM

## 2023-05-23 DIAGNOSIS — I5042 Chronic combined systolic (congestive) and diastolic (congestive) heart failure: Secondary | ICD-10-CM

## 2023-05-23 DIAGNOSIS — F331 Major depressive disorder, recurrent, moderate: Secondary | ICD-10-CM | POA: Diagnosis present

## 2023-05-23 DIAGNOSIS — F410 Panic disorder [episodic paroxysmal anxiety] without agoraphobia: Secondary | ICD-10-CM

## 2023-05-23 DIAGNOSIS — F411 Generalized anxiety disorder: Secondary | ICD-10-CM

## 2023-05-23 DIAGNOSIS — G894 Chronic pain syndrome: Secondary | ICD-10-CM | POA: Diagnosis present

## 2023-05-23 DIAGNOSIS — R6889 Other general symptoms and signs: Secondary | ICD-10-CM | POA: Diagnosis not present

## 2023-05-23 DIAGNOSIS — Z888 Allergy status to other drugs, medicaments and biological substances status: Secondary | ICD-10-CM

## 2023-05-23 DIAGNOSIS — Z87891 Personal history of nicotine dependence: Secondary | ICD-10-CM | POA: Diagnosis not present

## 2023-05-23 DIAGNOSIS — L732 Hidradenitis suppurativa: Secondary | ICD-10-CM | POA: Diagnosis present

## 2023-05-23 DIAGNOSIS — Z79818 Long term (current) use of other agents affecting estrogen receptors and estrogen levels: Secondary | ICD-10-CM

## 2023-05-23 DIAGNOSIS — F4312 Post-traumatic stress disorder, chronic: Secondary | ICD-10-CM

## 2023-05-23 DIAGNOSIS — I2721 Secondary pulmonary arterial hypertension: Secondary | ICD-10-CM | POA: Diagnosis present

## 2023-05-23 DIAGNOSIS — M797 Fibromyalgia: Secondary | ICD-10-CM | POA: Diagnosis present

## 2023-05-23 DIAGNOSIS — K219 Gastro-esophageal reflux disease without esophagitis: Secondary | ICD-10-CM | POA: Diagnosis present

## 2023-05-23 DIAGNOSIS — R0689 Other abnormalities of breathing: Secondary | ICD-10-CM | POA: Diagnosis not present

## 2023-05-23 DIAGNOSIS — Z7985 Long-term (current) use of injectable non-insulin antidiabetic drugs: Secondary | ICD-10-CM

## 2023-05-23 DIAGNOSIS — J439 Emphysema, unspecified: Secondary | ICD-10-CM | POA: Diagnosis present

## 2023-05-23 DIAGNOSIS — R509 Fever, unspecified: Secondary | ICD-10-CM | POA: Diagnosis not present

## 2023-05-23 DIAGNOSIS — K589 Irritable bowel syndrome without diarrhea: Secondary | ICD-10-CM | POA: Diagnosis present

## 2023-05-23 DIAGNOSIS — I509 Heart failure, unspecified: Secondary | ICD-10-CM

## 2023-05-23 DIAGNOSIS — I130 Hypertensive heart and chronic kidney disease with heart failure and stage 1 through stage 4 chronic kidney disease, or unspecified chronic kidney disease: Secondary | ICD-10-CM | POA: Diagnosis present

## 2023-05-23 DIAGNOSIS — I700 Atherosclerosis of aorta: Secondary | ICD-10-CM | POA: Diagnosis not present

## 2023-05-23 DIAGNOSIS — Z8249 Family history of ischemic heart disease and other diseases of the circulatory system: Secondary | ICD-10-CM

## 2023-05-23 DIAGNOSIS — R059 Cough, unspecified: Secondary | ICD-10-CM | POA: Diagnosis not present

## 2023-05-23 DIAGNOSIS — Z6841 Body Mass Index (BMI) 40.0 and over, adult: Secondary | ICD-10-CM

## 2023-05-23 DIAGNOSIS — G4733 Obstructive sleep apnea (adult) (pediatric): Secondary | ICD-10-CM | POA: Diagnosis present

## 2023-05-23 DIAGNOSIS — I5032 Chronic diastolic (congestive) heart failure: Secondary | ICD-10-CM | POA: Diagnosis present

## 2023-05-23 DIAGNOSIS — Z8673 Personal history of transient ischemic attack (TIA), and cerebral infarction without residual deficits: Secondary | ICD-10-CM | POA: Diagnosis not present

## 2023-05-23 DIAGNOSIS — Z860100 Personal history of colon polyps, unspecified: Secondary | ICD-10-CM | POA: Diagnosis not present

## 2023-05-23 DIAGNOSIS — Z9981 Dependence on supplemental oxygen: Secondary | ICD-10-CM

## 2023-05-23 DIAGNOSIS — Z79891 Long term (current) use of opiate analgesic: Secondary | ICD-10-CM

## 2023-05-23 DIAGNOSIS — R0602 Shortness of breath: Secondary | ICD-10-CM | POA: Diagnosis present

## 2023-05-23 DIAGNOSIS — Z743 Need for continuous supervision: Secondary | ICD-10-CM | POA: Diagnosis not present

## 2023-05-23 DIAGNOSIS — J9611 Chronic respiratory failure with hypoxia: Secondary | ICD-10-CM | POA: Diagnosis present

## 2023-05-23 DIAGNOSIS — J441 Chronic obstructive pulmonary disease with (acute) exacerbation: Secondary | ICD-10-CM | POA: Diagnosis present

## 2023-05-23 DIAGNOSIS — F4310 Post-traumatic stress disorder, unspecified: Secondary | ICD-10-CM | POA: Diagnosis present

## 2023-05-23 DIAGNOSIS — Z79899 Other long term (current) drug therapy: Secondary | ICD-10-CM

## 2023-05-23 DIAGNOSIS — R000 Tachycardia, unspecified: Secondary | ICD-10-CM | POA: Diagnosis present

## 2023-05-23 DIAGNOSIS — Z818 Family history of other mental and behavioral disorders: Secondary | ICD-10-CM

## 2023-05-23 DIAGNOSIS — R079 Chest pain, unspecified: Secondary | ICD-10-CM | POA: Diagnosis not present

## 2023-05-23 LAB — CBC WITH DIFFERENTIAL/PLATELET
Abs Immature Granulocytes: 0.03 K/uL (ref 0.00–0.07)
Basophils Absolute: 0 K/uL (ref 0.0–0.1)
Basophils Relative: 0 %
Eosinophils Absolute: 0.1 K/uL (ref 0.0–0.5)
Eosinophils Relative: 1 %
HCT: 34.5 % — ABNORMAL LOW (ref 36.0–46.0)
Hemoglobin: 10.8 g/dL — ABNORMAL LOW (ref 12.0–15.0)
Immature Granulocytes: 0 %
Lymphocytes Relative: 26 %
Lymphs Abs: 2.5 K/uL (ref 0.7–4.0)
MCH: 26.3 pg (ref 26.0–34.0)
MCHC: 31.3 g/dL (ref 30.0–36.0)
MCV: 83.9 fL (ref 80.0–100.0)
Monocytes Absolute: 0.9 K/uL (ref 0.1–1.0)
Monocytes Relative: 9 %
Neutro Abs: 6 K/uL (ref 1.7–7.7)
Neutrophils Relative %: 64 %
Platelets: 377 K/uL (ref 150–400)
RBC: 4.11 MIL/uL (ref 3.87–5.11)
RDW: 15.2 % (ref 11.5–15.5)
WBC: 9.6 K/uL (ref 4.0–10.5)
nRBC: 0 % (ref 0.0–0.2)

## 2023-05-23 LAB — COMPREHENSIVE METABOLIC PANEL WITH GFR
ALT: 11 U/L (ref 0–44)
AST: 13 U/L — ABNORMAL LOW (ref 15–41)
Albumin: 3.6 g/dL (ref 3.5–5.0)
Alkaline Phosphatase: 71 U/L (ref 38–126)
Anion gap: 9 (ref 5–15)
BUN: 8 mg/dL (ref 6–20)
CO2: 21 mmol/L — ABNORMAL LOW (ref 22–32)
Calcium: 8.9 mg/dL (ref 8.9–10.3)
Chloride: 111 mmol/L (ref 98–111)
Creatinine, Ser: 0.96 mg/dL (ref 0.44–1.00)
GFR, Estimated: >60 mL/min
Glucose, Bld: 95 mg/dL (ref 70–99)
Potassium: 4 mmol/L (ref 3.5–5.1)
Sodium: 141 mmol/L (ref 135–145)
Total Bilirubin: 0.4 mg/dL (ref 0.0–1.2)
Total Protein: 6.9 g/dL (ref 6.5–8.1)

## 2023-05-23 LAB — BRAIN NATRIURETIC PEPTIDE: B Natriuretic Peptide: 16 pg/mL (ref 0.0–100.0)

## 2023-05-23 LAB — CBG MONITORING, ED: Glucose-Capillary: 103 mg/dL — ABNORMAL HIGH (ref 70–99)

## 2023-05-23 LAB — TROPONIN I (HIGH SENSITIVITY)
Troponin I (High Sensitivity): 3 ng/L (ref <18)
Troponin I (High Sensitivity): <2 ng/L (ref <18)

## 2023-05-23 MED ORDER — ONDANSETRON HCL 4 MG/2ML IJ SOLN
4.0000 mg | Freq: Four times a day (QID) | INTRAMUSCULAR | Status: DC | PRN
  Administered 2023-05-24 – 2023-05-25 (×3): 4 mg via INTRAVENOUS
  Filled 2023-05-23 (×3): qty 2

## 2023-05-23 MED ORDER — POLYETHYLENE GLYCOL 3350 17 G PO PACK: 17.0000 g | PACK | Freq: Every day | ORAL | Status: DC | PRN

## 2023-05-23 MED ORDER — ENOXAPARIN SODIUM 80 MG/0.8ML IJ SOSY
70.0000 mg | PREFILLED_SYRINGE | INTRAMUSCULAR | Status: DC
  Administered 2023-05-23 – 2023-05-24 (×2): 70 mg via SUBCUTANEOUS
  Filled 2023-05-23 (×3): qty 0.7

## 2023-05-23 MED ORDER — TORSEMIDE 20 MG PO TABS
20.0000 mg | ORAL_TABLET | Freq: Two times a day (BID) | ORAL | Status: DC
  Administered 2023-05-23 – 2023-05-25 (×4): 20 mg via ORAL
  Filled 2023-05-23 (×4): qty 1

## 2023-05-23 MED ORDER — ALBUTEROL SULFATE (2.5 MG/3ML) 0.083% IN NEBU
5.0000 mg | INHALATION_SOLUTION | Freq: Once | RESPIRATORY_TRACT | Status: AC
  Administered 2023-05-23: 5 mg via RESPIRATORY_TRACT
  Filled 2023-05-23: qty 6

## 2023-05-23 MED ORDER — SODIUM CHLORIDE 0.9 % IV SOLN
1.0000 g | INTRAVENOUS | Status: DC
  Administered 2023-05-23 – 2023-05-24 (×2): 1 g via INTRAVENOUS
  Filled 2023-05-23 (×2): qty 10

## 2023-05-23 MED ORDER — METHYLPREDNISOLONE SODIUM SUCC 125 MG IJ SOLR
125.0000 mg | Freq: Once | INTRAMUSCULAR | Status: AC
  Administered 2023-05-23: 125 mg via INTRAVENOUS
  Filled 2023-05-23: qty 2

## 2023-05-23 MED ORDER — IPRATROPIUM-ALBUTEROL 0.5-2.5 (3) MG/3ML IN SOLN
3.0000 mL | Freq: Four times a day (QID) | RESPIRATORY_TRACT | Status: DC
  Administered 2023-05-23 – 2023-05-24 (×2): 3 mL via RESPIRATORY_TRACT
  Filled 2023-05-23 (×2): qty 3

## 2023-05-23 MED ORDER — LAMOTRIGINE 100 MG PO TABS
50.0000 mg | ORAL_TABLET | Freq: Two times a day (BID) | ORAL | Status: DC
Start: 2023-05-23 — End: 2023-05-25
  Administered 2023-05-23 – 2023-05-25 (×4): 50 mg via ORAL
  Filled 2023-05-23 (×3): qty 1
  Filled 2023-05-23: qty 2

## 2023-05-23 MED ORDER — OXYCODONE HCL 5 MG PO TABS
5.0000 mg | ORAL_TABLET | Freq: Two times a day (BID) | ORAL | Status: DC | PRN
  Administered 2023-05-23 – 2023-05-25 (×5): 5 mg via ORAL
  Filled 2023-05-23 (×5): qty 1

## 2023-05-23 MED ORDER — IPRATROPIUM BROMIDE 0.02 % IN SOLN
0.5000 mg | Freq: Once | RESPIRATORY_TRACT | Status: AC
  Administered 2023-05-23: 0.5 mg via RESPIRATORY_TRACT
  Filled 2023-05-23: qty 2.5

## 2023-05-23 MED ORDER — IOHEXOL 350 MG/ML SOLN
75.0000 mL | Freq: Once | INTRAVENOUS | Status: AC | PRN
  Administered 2023-05-23: 75 mL via INTRAVENOUS

## 2023-05-23 MED ORDER — SODIUM CHLORIDE 0.9% FLUSH
3.0000 mL | Freq: Two times a day (BID) | INTRAVENOUS | Status: DC
  Administered 2023-05-23 – 2023-05-25 (×4): 3 mL via INTRAVENOUS

## 2023-05-23 MED ORDER — PANTOPRAZOLE SODIUM 40 MG PO TBEC
40.0000 mg | DELAYED_RELEASE_TABLET | Freq: Every day | ORAL | Status: DC
  Administered 2023-05-24 – 2023-05-25 (×2): 40 mg via ORAL
  Filled 2023-05-23 (×2): qty 1

## 2023-05-23 MED ORDER — TIZANIDINE HCL 4 MG PO TABS
4.0000 mg | ORAL_TABLET | Freq: Three times a day (TID) | ORAL | Status: DC | PRN
  Administered 2023-05-24 – 2023-05-25 (×3): 4 mg via ORAL
  Filled 2023-05-23 (×3): qty 1

## 2023-05-23 MED ORDER — DOXEPIN HCL 25 MG PO CAPS
25.0000 mg | ORAL_CAPSULE | Freq: Every day | ORAL | Status: DC
  Administered 2023-05-23 – 2023-05-24 (×2): 25 mg via ORAL
  Filled 2023-05-23 (×3): qty 1

## 2023-05-23 MED ORDER — ACETAMINOPHEN 325 MG PO TABS
650.0000 mg | ORAL_TABLET | Freq: Four times a day (QID) | ORAL | Status: DC | PRN
  Administered 2023-05-23: 650 mg via ORAL
  Filled 2023-05-23: qty 2

## 2023-05-23 MED ORDER — IPRATROPIUM-ALBUTEROL 0.5-2.5 (3) MG/3ML IN SOLN: 3.0000 mL | RESPIRATORY_TRACT | Status: DC | PRN

## 2023-05-23 MED ORDER — GUAIFENESIN 100 MG/5ML PO LIQD
5.0000 mL | ORAL | Status: DC | PRN
  Administered 2023-05-24: 5 mL via ORAL
  Filled 2023-05-23: qty 10

## 2023-05-23 MED ORDER — ONDANSETRON HCL 4 MG PO TABS: 4.0000 mg | ORAL_TABLET | Freq: Four times a day (QID) | ORAL | Status: DC | PRN

## 2023-05-23 MED ORDER — SACUBITRIL-VALSARTAN 24-26 MG PO TABS
1.0000 | ORAL_TABLET | Freq: Two times a day (BID) | ORAL | Status: DC
  Administered 2023-05-23 – 2023-05-25 (×4): 1 via ORAL
  Filled 2023-05-23 (×4): qty 1

## 2023-05-23 MED ORDER — ACETAMINOPHEN 650 MG RE SUPP: 650.0000 mg | Freq: Four times a day (QID) | RECTAL | Status: DC | PRN

## 2023-05-23 MED ORDER — PREDNISONE 20 MG PO TABS
40.0000 mg | ORAL_TABLET | Freq: Every day | ORAL | Status: DC
  Administered 2023-05-25: 40 mg via ORAL
  Filled 2023-05-23: qty 2

## 2023-05-23 MED ORDER — ATORVASTATIN CALCIUM 40 MG PO TABS
40.0000 mg | ORAL_TABLET | Freq: Every day | ORAL | Status: DC
Start: 2023-05-24 — End: 2023-05-25
  Administered 2023-05-24 – 2023-05-25 (×2): 40 mg via ORAL
  Filled 2023-05-23 (×2): qty 1

## 2023-05-23 MED ORDER — METHYLPREDNISOLONE SODIUM SUCC 125 MG IJ SOLR
125.0000 mg | Freq: Two times a day (BID) | INTRAMUSCULAR | Status: AC
  Administered 2023-05-24 (×2): 125 mg via INTRAVENOUS
  Filled 2023-05-23 (×2): qty 2

## 2023-05-23 MED ORDER — BREXPIPRAZOLE 1 MG PO TABS
3.0000 mg | ORAL_TABLET | Freq: Every day | ORAL | Status: DC
  Filled 2023-05-23 (×2): qty 3

## 2023-05-23 NOTE — ED Triage Notes (Signed)
 Patient presents to ED via [facility] from home states she has been having sob since Tues. C/o nasal congestion  c/o chest pain worse with inspiration and cough. C/o being tx. For URI 2 weeks ago.

## 2023-05-23 NOTE — H&P (Signed)
 History and Physical    Lindsay [name] NWG:[id number] DOB: Sep 14, 1970 DOA: 05/23/2023  PCP: Tretha Fu, MD   Patient coming from: Home  Chief Complaint: Cough, SOB  HPI: Lindsay [name] is a pleasant 53 y.o. female with medical history significant for depression, anxiety, PTSD, COPD, OSA, chronic HFpEF, pulmonary hypertension, chronic hypoxic respiratory failure, and history of CVA who presents with 3 to 4 days of worsening shortness of breath and increased cough.  Patient had been experiencing some nasal congestion and cough for at least 2 weeks before developing worsening shortness of breath and increased cough over the past 3 or 4 days.  She has had chest discomfort associated with this, particularly when taking a deep breath or coughing.  ED Course: Upon arrival to the ED, patient is found to be afebrile and saturating well on 2 L/min of supplemental oxygen  with mild tachypnea, mild tachycardia, and stable BP.  Labs are most notable for normal renal function, normal WBC, normal BNP, and negative troponin x 2.  There are no acute findings on chest x-ray or CTA chest.  Patient was treated with 125 mg IV Solu-Medrol , albuterol , and Atrovent  in the ED.  Review of Systems:  All other systems reviewed and apart from HPI, are negative.  Past Medical History:  Diagnosis Date   Acid reflux    Anemia    Iron  Def   Anorexia    CHF (congestive heart failure) (HCC)    Chronic kidney disease    Nephrotic syndrome   Colon polyp 2009   Depression with anxiety    Edema leg    Fibroid tumor 04/2022   Fibromyalgia    Hemorrhoids    Hidradenitis suppurativa    Hypertension    IBS (irritable bowel syndrome)    Low back pain    Migraines    Morbidly obese (HCC)    Neuromuscular disorder (HCC)    fibromyalgia   Neuropathy    Panic attacks    Polyp of vocal cord or larynx    Stroke (HCC)    Tonsil pain     Past Surgical History:  Procedure Laterality Date   AXILLARY HIDRADENITIS  EXCISION     COLONOSCOPY WITH PROPOFOL  N/A 05/25/2015   Procedure: COLONOSCOPY WITH PROPOFOL ;  Surgeon: Janel Medford, MD;  Location: [facility];  Service: Endoscopy;  Laterality: N/A;   COLONOSCOPY WITH PROPOFOL  N/A 12/31/2018   Procedure: COLONOSCOPY WITH PROPOFOL ;  Surgeon: Janel Medford, MD;  Location: [facility];  Service: Endoscopy;  Laterality: N/A;   HEMORRHOID SURGERY     with Hidradenitis surgery    INGUINAL HIDRADENITIS EXCISION     TONSILLECTOMY  10/18/2010   by Dr. Melissa Spring   UPPER GASTROINTESTINAL ENDOSCOPY      Social History:   reports that she quit smoking about 4 years ago. Her smoking use included cigarettes. She started smoking about 29 years ago. She has a 2.5 pack-year smoking history. She has never used smokeless tobacco. She reports that she does not currently use alcohol. She reports that she does not currently use drugs after having used the following drugs: Marijuana. Frequency: 3.00 times per week.  Allergies  Allergen Reactions   Lisinopril  Rash and Cough    Family History  Problem Relation Age of Onset   Diabetes Mother    Heart disease Mother        valve leak   Anxiety disorder Mother    Depression Mother    High blood pressure Mother    Kidney  failure Mother    Cancer Father        prostate   Heart disease Father    Learning disabilities Sister    Depression Sister    Depression Sister    Anxiety disorder Sister    Colon cancer Neg Hx      Prior to Admission medications   Medication Sig Start Date End Date Taking? Authorizing Provider  ACCU-CHEK GUIDE test strip USE ONE TEST STRIP TO CHECK BLOOD SUGARS ONCE A DAY AS NEEDED 10/16/22   Lasandra Points, MD  Accu-Chek Softclix Lancets lancets USE ONE LANCET TO CHECK BLOOD SUGAR ONE A DAY AS NEEDED 07/03/22   Lasandra Points, MD  acetaminophen  (TYLENOL ) 500 MG tablet Take 1 tablet (500 mg total) by mouth every 6 (six) hours as needed. 09/17/22   Deatra Face, MD  albuterol   (PROVENTIL ) (2.5 MG/3ML) 0.083% nebulizer solution Take 3 mLs (2.5 mg total) by nebulization every 6 (six) hours as needed for wheezing or shortness of breath. 11/06/22   Guilloud, Carolyn, MD  albuterol  (VENTOLIN  HFA) 108 (90 Base) MCG/ACT inhaler TAKE 2 PUFFS BY MOUTH EVERY 6 HOURS AS NEEDED FOR WHEEZE OR SHORTNESS OF BREATH 05/20/22   Olalere, Adewale A, MD  atorvastatin  (LIPITOR) 40 MG tablet TAKE 1 TABLET BY MOUTH EVERY DAY 04/14/23   Guilloud, Carolyn, MD  Azelastine  HCl 137 MCG/SPRAY SOLN PLACE 1 SPRAY INTO BOTH NOSTRILS 2 (TWO) TIMES DAILY. USE IN EACH NOSTRIL AS DIRECTED Patient taking differently: as needed. 01/17/23   Guilloud, Carolyn, MD  Blood Glucose Monitoring Suppl (ACCU-CHEK GUIDE ME) w/Device KIT Dispense one device 02/16/20   Lasandra Points, MD  Brexpiprazole  (REXULTI ) 3 MG TABS Take 1 tablet (3 mg total) by mouth daily. 03/25/23   [name], Bronson Canny, MD  cetirizine  (ZYRTEC ) 10 MG tablet Take 1 tablet (10 mg total) by mouth daily. 08/05/22   Guilloud, Carolyn, MD  dicyclomine  (BENTYL ) 10 MG capsule TAKE 2 CAPSULES (20 MG TOTAL) BY MOUTH 3 (THREE) TIMES DAILY AS NEEDED FOR SPASMS (ABDOMINAL PAIN). 05/21/23   Edmonia Gottron, PA-C  doxepin  (SINEQUAN ) 25 MG capsule Take 1 capsule (25 mg total) by mouth at bedtime. 03/25/23   [name], Bronson Canny, MD  ENTRESTO  24-26 MG TAKE 1 TABLET BY MOUTH TWICE A DAY 03/12/23   Guilloud, Carolyn, MD  fluticasone  (FLONASE ) 50 MCG/ACT nasal spray SPRAY 1 SPRAY INTO EACH NOSTRIL DAILY Patient taking differently: as needed. 09/24/22   Guilloud, Carolyn, MD  hydrocortisone  (ANUSOL -HC) 2.5 % rectal cream Place 1 Application rectally as needed. 05/02/23   [provider]  ibuprofen  (ADVIL ) 200 MG tablet Take 2 tablets (400 mg total) by mouth every 8 (eight) hours as needed for moderate pain (pain score 4-6). 01/20/23   Ether Hercules, MD  ipratropium (ATROVENT ) 0.06 % nasal spray PLEASE SEE ATTACHED FOR DETAILED DIRECTIONS 04/14/20   [provider]   lamoTRIgine  (LAMICTAL ) 100 MG tablet Take 0.5 tablets (50 mg total) by mouth 2 (two) times daily. 03/25/23   [name], Bronson Canny, MD  LINZESS  290 MCG CAPS capsule TAKE 1 CAPSULE BY MOUTH DAILY BEFORE BREAKFAST. 05/21/23   Collier, Amanda R, PA-C  meclizine  (ANTIVERT ) 25 MG tablet Take 1 tablet (25 mg total) by mouth 3 (three) times daily as needed for dizziness. 05/01/23 05/31/23  Driscilla George, MD  megestrol  (MEGACE ) 40 MG tablet Take 2 tablets (80 mg total) by mouth daily. 06/10/22   Granville Layer, MD  metoprolol  tartrate (LOPRESSOR ) 25 MG tablet Take 25 mg by mouth  daily.    [provider]  omeprazole  (PRILOSEC) 40 MG capsule TAKE 1 CAPSULE (40 MG TOTAL) BY MOUTH DAILY. 01/17/23   Edmonia Gottron, PA-C  ondansetron  (ZOFRAN -ODT) 8 MG disintegrating tablet Take 1 tablet (8 mg total) by mouth every 8 (eight) hours as needed. 05/01/23   Driscilla George, MD  oxyCODONE  (OXY IR/ROXICODONE ) 5 MG immediate release tablet Take by mouth. 03/10/23   [provider]  promethazine  (PHENERGAN ) 25 MG tablet Take 1 tablet (25 mg total) by mouth every 6 (six) hours as needed. 05/15/23   Edmonia Gottron, PA-C  Safety Seal Miscellaneous MISC Apply 1 Application topically in the morning. Medication Name; AA Gel 04/16/23   Dellar Fenton, DO  Semaglutide , 2 MG/DOSE, (OZEMPIC , 2 MG/DOSE,) 8 MG/3ML SOPN INJECT 2 MG INTO THE SKIN ONCE A WEEK. 04/14/23   Lasandra Points, MD  tiZANidine  (ZANAFLEX ) 4 MG tablet TAKE 1 TABLET (4 MG TOTAL) BY MOUTH EVERY 8 (EIGHT) HOURS AS NEEDED FOR MUSCLE SPASMS 03/20/23   Lasandra Points, MD  torsemide  (DEMADEX ) 20 MG tablet TAKE 1 TABLET BY MOUTH TWICE A DAY 01/27/23   Lasandra Points, MD    Physical Exam: Vitals:   05/23/23 1332 05/23/23 1428 05/23/23 1715 05/23/23 1848  BP: 128/85 129/88 127/87 136/80  Pulse: 94 95  100  Resp: 20 18 (!) 23 17  Temp: 98.2 F (36.8 C) 98.7 F (37.1 C)  99.2 F (37.3 C)  TempSrc:    Oral  SpO2: 94% 100%  97%  Weight:      Height:         Constitutional: NAD, no pallor or diaphoresis  Eyes: PERTLA, lids and conjunctivae normal ENMT: Mucous membranes are moist. Posterior pharynx clear of any exudate or lesions.   Neck: supple, no masses  Respiratory: Speaking full sentences, mild tachypnea. Diminished bilaterally with prolonged expiratory phase.   Cardiovascular: S1 & S2 heard, regular rate and rhythm. Mild lower extremity edema.  Abdomen: No tenderness, soft. Bowel sounds active.  Musculoskeletal: no clubbing / cyanosis. No joint deformity upper and lower extremities.   Skin: no significant rashes, lesions, ulcers. Warm, dry, well-perfused. Neurologic: CN 2-12 grossly intact. Moving all extremities. Alert and oriented.  Psychiatric: Calm. Cooperative.    Labs and Imaging on Admission: I have personally reviewed following labs and imaging studies  CBC: Recent Labs  Lab 05/23/23 0853  WBC 9.6  NEUTROABS 6.0  HGB 10.8*  HCT 34.5*  MCV 83.9  PLT 377   Basic Metabolic Panel: Recent Labs  Lab 05/23/23 0853  NA 141  K 4.0  CL 111  CO2 21*  GLUCOSE 95  BUN 8  CREATININE 0.96  CALCIUM  8.9   GFR: Estimated Creatinine Clearance: 102.7 mL/min (by C-G formula based on SCr of 0.96 mg/dL). Liver Function Tests: Recent Labs  Lab 05/23/23 0853  AST 13*  ALT 11  ALKPHOS 71  BILITOT 0.4  PROT 6.9  ALBUMIN  3.6   No results for input(s): "LIPASE", "AMYLASE" in the last 168 hours. No results for input(s): "AMMONIA" in the last 168 hours. Coagulation Profile: No results for input(s): "INR", "PROTIME" in the last 168 hours. Cardiac Enzymes: No results for input(s): "CKTOTAL", "CKMB", "CKMBINDEX", "TROPONINI" in the last 168 hours. BNP (last 3 results) No results for input(s): "PROBNP" in the last 8760 hours. HbA1C: No results for input(s): "HGBA1C" in the last 72 hours. CBG: Recent Labs  Lab 05/23/23 0854  GLUCAP 103*   Lipid Profile: No results for input(s): "CHOL", "  HDL", "LDLCALC", "TRIG",  "CHOLHDL", "LDLDIRECT" in the last 72 hours. Thyroid  Function Tests: No results for input(s): "TSH", "T4TOTAL", "FREET4", "T3FREE", "THYROIDAB" in the last 72 hours. Anemia Panel: No results for input(s): "VITAMINB12", "FOLATE", "FERRITIN", "TIBC", "IRON ", "RETICCTPCT" in the last 72 hours. Urine analysis:    Component Value Date/Time   COLORURINE YELLOW 01/13/2023 0401   APPEARANCEUR CLEAR 01/13/2023 0401   APPEARANCEUR Clear 04/23/2018 0935   LABSPEC 1.038 (H) 01/13/2023 0401   PHURINE 5.0 01/13/2023 0401   GLUCOSEU NEGATIVE 01/13/2023 0401   GLUCOSEU NEG mg/dL 16/10/9602 5409   HGBUR NEGATIVE 01/13/2023 0401   HGBUR negative 09/06/2008 1005   BILIRUBINUR NEGATIVE 01/13/2023 0401   BILIRUBINUR NEGATIVE 03/13/2022 0919   BILIRUBINUR Negative 04/23/2018 0935   KETONESUR NEGATIVE 01/13/2023 0401   PROTEINUR NEGATIVE 01/13/2023 0401   UROBILINOGEN 0.2 03/13/2022 0919   UROBILINOGEN 0.2 07/17/2014 0735   NITRITE NEGATIVE 01/13/2023 0401   LEUKOCYTESUR NEGATIVE 01/13/2023 0401   Sepsis Labs: @LABRCNTIP (procalcitonin:4,lacticidven:4) )No results found for this or any previous visit (from the past 240 hours).   Radiological Exams on Admission: CT Angio Chest PE W/Cm &/Or Wo Cm Result Date: 05/23/2023 CLINICAL DATA:  Shortness of breath since Tuesday. Chest pain and cough. EXAM: CT ANGIOGRAPHY CHEST WITH CONTRAST TECHNIQUE: Multidetector CT imaging of the chest was performed using the standard protocol during bolus administration of intravenous contrast. Multiplanar CT image reconstructions and MIPs were obtained to evaluate the vascular anatomy. RADIATION DOSE REDUCTION: This exam was performed according to the departmental dose-optimization program which includes automated exposure control, adjustment of the mA and/or kV according to patient size and/or use of iterative reconstruction technique. CONTRAST:  75mL OMNIPAQUE  IOHEXOL  350 MG/ML SOLN COMPARISON:  Chest radiograph of earlier  today. ED note of earlier today. Most recent CT of 01/12/2023. FINDINGS: Cardiovascular: The quality of this exam for evaluation of pulmonary embolism is poor. The bolus is poorly timed, with contrast centered in the SVC. No pulmonary embolism to the lobar level. To the lung bases, no large segmental embolism. Aortic atherosclerosis. Mild cardiomegaly, without pericardial effusion. Pulmonary artery enlargement, outflow tract 3.7 cm. Mediastinum/Nodes: No mediastinal or hilar adenopathy. Lungs/Pleura: No pleural fluid. Mild left hemidiaphragm elevation with subsegmental atelectasis and volume loss at the left lung base. 5 mm left lower lobe pulmonary nodule on 67/6 is unchanged; can be presumed benign and do/does not warrant imaging follow-up per Fleischner criteria. Upper Abdomen: Normal imaged portions of the liver, spleen, stomach, pancreas, adrenal glands, kidneys. Musculoskeletal: No acute osseous abnormality. Mild convex right thoracic spine curvature. Review of the MIP images confirms the above findings. IMPRESSION: 1. Suboptimal bolus timing. No pulmonary embolism with significant limitations above. 2. No explanation for patient's symptoms. 3. Pulmonary artery enlargement suggests pulmonary arterial hypertension. 4.  Aortic Atherosclerosis (ICD10-I70.0). Electronically Signed   By: Lore Rode M.D.   On: 05/23/2023 18:27   DG Chest 2 View Result Date: 05/23/2023 CLINICAL DATA:  Chest pain, shortness of breath. EXAM: CHEST - 2 VIEW COMPARISON:  February 25, 2023. FINDINGS: The heart size and mediastinal contours are within normal limits. Both lungs are clear. The visualized skeletal structures are unremarkable. IMPRESSION: No active cardiopulmonary disease. Electronically Signed   By: Rosalene Colon M.D.   On: 05/23/2023 09:35    EKG: Independently reviewed. Sinus tachycardia, rate 103.   Assessment/Plan   1. COPD exacerbation; OSA; chronic hypoxic respiratory failure  - Culture sputum, start  antibiotic, continue systemic steroid, continue short-acting bronchodilators, continue supplemental O2, CPAP while  sleeping   2. Chronic HFpEF; pulmonary HTN  - Appears compensated  - Continue torsemide , Entresto     3. Depression, anxiety  - Continue Rexulti , doxepin , Lamictal     4. Chronic pain - Prescription database reviewed, plan to continue oxycodone  and tizanidine     DVT prophylaxis: Lovenox   Code Status: Full  Level of Care: Level of care: Telemetry Medical Family Communication: None present   Disposition Plan:  Patient is from: home  Anticipated d/c is to: Home  Anticipated d/c date is: 05/26/23  Patient currently: Pending improved respiratory status  Consults called: none  Admission status: Inpatient     Walton Guppy, MD [facility]  05/23/2023, 7:32 PM

## 2023-05-23 NOTE — ED Provider Notes (Signed)
 Lindsay [name]   CSN: [id number] Arrival date & time: 05/23/23  1914     History  Chief Complaint  Patient presents with   Shortness of Breath    Lindsay [name] is a 53 y.o. female.  53 year old female presents with shortness of breath x 3 days.  History of CHF as well as COPD/emphysema.  States that she has had increasing cough which has been nonproductive.  No fever or chills.  She has noted persistent chest tightness.  Unresponsive to home albuterol .  Patient states that she is chronically on 3 L of oxygen  at baseline.  Notes orthopnea as well as dyspnea on exertion.       Home Medications Prior to Admission medications   Medication Sig Start Date End Date Taking? Authorizing Provider  ACCU-CHEK GUIDE test strip USE ONE TEST STRIP TO CHECK BLOOD SUGARS ONCE A DAY AS NEEDED 10/16/22   Lasandra Points, MD  Accu-Chek Softclix Lancets lancets USE ONE LANCET TO CHECK BLOOD SUGAR ONE A DAY AS NEEDED 07/03/22   Lasandra Points, MD  acetaminophen  (TYLENOL ) 500 MG tablet Take 1 tablet (500 mg total) by mouth every 6 (six) hours as needed. 09/17/22   Deatra Face, MD  albuterol  (PROVENTIL ) (2.5 MG/3ML) 0.083% nebulizer solution Take 3 mLs (2.5 mg total) by nebulization every 6 (six) hours as needed for wheezing or shortness of breath. 11/06/22   Guilloud, Carolyn, MD  albuterol  (VENTOLIN  HFA) 108 (90 Base) MCG/ACT inhaler TAKE 2 PUFFS BY MOUTH EVERY 6 HOURS AS NEEDED FOR WHEEZE OR SHORTNESS OF BREATH 05/20/22   Olalere, Adewale A, MD  atorvastatin  (LIPITOR) 40 MG tablet TAKE 1 TABLET BY MOUTH EVERY DAY 04/14/23   Lasandra Points, MD  Azelastine  HCl 137 MCG/SPRAY SOLN PLACE 1 SPRAY INTO BOTH NOSTRILS 2 (TWO) TIMES DAILY. USE IN EACH NOSTRIL AS DIRECTED Patient taking differently: as needed. 01/17/23   Guilloud, Carolyn, MD  Blood Glucose Monitoring Suppl (ACCU-CHEK GUIDE ME) w/Device KIT Dispense one device 02/16/20   Lasandra Points, MD  Brexpiprazole  (REXULTI ) 3 MG TABS Take 1 tablet (3 mg total) by mouth daily. 03/25/23   [name], Bronson Canny, MD  cetirizine  (ZYRTEC ) 10 MG tablet Take 1 tablet (10 mg total) by mouth daily. 08/05/22   Guilloud, Carolyn, MD  dicyclomine  (BENTYL ) 10 MG capsule TAKE 2 CAPSULES (20 MG TOTAL) BY MOUTH 3 (THREE) TIMES DAILY AS NEEDED FOR SPASMS (ABDOMINAL PAIN). 05/21/23   Edmonia Gottron, PA-C  doxepin  (SINEQUAN ) 25 MG capsule Take 1 capsule (25 mg total) by mouth at bedtime. 03/25/23   [name], Bronson Canny, MD  ENTRESTO  24-26 MG TAKE 1 TABLET BY MOUTH TWICE A DAY 03/12/23   Guilloud, Carolyn, MD  fluticasone  (FLONASE ) 50 MCG/ACT nasal spray SPRAY 1 SPRAY INTO EACH NOSTRIL DAILY Patient taking differently: as needed. 09/24/22   Guilloud, Carolyn, MD  hydrocortisone  (ANUSOL -HC) 2.5 % rectal cream Place 1 Application rectally as needed. 05/02/23   [provider]  ibuprofen  (ADVIL ) 200 MG tablet Take 2 tablets (400 mg total) by mouth every 8 (eight) hours as needed for moderate pain (pain score 4-6). 01/20/23   Ether Hercules, MD  ipratropium (ATROVENT ) 0.06 % nasal spray PLEASE SEE ATTACHED FOR DETAILED DIRECTIONS 04/14/20   [provider]  lamoTRIgine  (LAMICTAL ) 100 MG tablet Take 0.5 tablets (50 mg total) by mouth 2 (two) times daily. 03/25/23   [name], Bronson Canny, MD  LINZESS  290 MCG CAPS capsule TAKE 1 CAPSULE BY MOUTH  DAILY BEFORE BREAKFAST. 05/21/23   Edmonia Gottron, PA-C  meclizine  (ANTIVERT ) 25 MG tablet Take 1 tablet (25 mg total) by mouth 3 (three) times daily as needed for dizziness. 05/01/23 05/31/23  Driscilla George, MD  megestrol  (MEGACE ) 40 MG tablet Take 2 tablets (80 mg total) by mouth daily. 06/10/22   Granville Layer, MD  metoprolol  tartrate (LOPRESSOR ) 25 MG tablet Take 25 mg by mouth daily.    [provider]  omeprazole  (PRILOSEC) 40 MG capsule TAKE 1 CAPSULE (40 MG TOTAL) BY MOUTH DAILY. 01/17/23   Edmonia Gottron, PA-C  ondansetron  (ZOFRAN -ODT) 8 MG  disintegrating tablet Take 1 tablet (8 mg total) by mouth every 8 (eight) hours as needed. 05/01/23   Driscilla George, MD  oxyCODONE  (OXY IR/ROXICODONE ) 5 MG immediate release tablet Take by mouth. 03/10/23   [provider]  promethazine  (PHENERGAN ) 25 MG tablet Take 1 tablet (25 mg total) by mouth every 6 (six) hours as needed. 05/15/23   Edmonia Gottron, PA-C  Safety Seal Miscellaneous MISC Apply 1 Application topically in the morning. Medication Name; AA Gel 04/16/23   Dellar Fenton, DO  Semaglutide , 2 MG/DOSE, (OZEMPIC , 2 MG/DOSE,) 8 MG/3ML SOPN INJECT 2 MG INTO THE SKIN ONCE A WEEK. 04/14/23   Lasandra Points, MD  tiZANidine  (ZANAFLEX ) 4 MG tablet TAKE 1 TABLET (4 MG TOTAL) BY MOUTH EVERY 8 (EIGHT) HOURS AS NEEDED FOR MUSCLE SPASMS 03/20/23   Lasandra Points, MD  torsemide  (DEMADEX ) 20 MG tablet TAKE 1 TABLET BY MOUTH TWICE A DAY 01/27/23   Lasandra Points, MD      Allergies    Lisinopril     Review of Systems   Review of Systems  All other systems reviewed and are negative.   Physical Exam Updated Vital Signs BP 129/88 (BP Location: Right Arm)   Pulse 95   Temp 98.7 F (37.1 C)   Resp 18   Ht 1.753 m (5\' 9" )   Wt (!) 140.6 kg   SpO2 100%   BMI 45.78 kg/m  Physical Exam Vitals and nursing [name] reviewed.  Constitutional:      General: She is not in acute distress.    Appearance: Normal appearance. She is well-developed. She is not toxic-appearing.  HENT:     Head: Normocephalic and atraumatic.  Eyes:     General: Lids are normal.     Conjunctiva/sclera: Conjunctivae normal.     Pupils: Pupils are equal, round, and reactive to light.  Neck:     Thyroid : No thyroid  mass.     Trachea: No tracheal deviation.  Cardiovascular:     Rate and Rhythm: Normal rate and regular rhythm.     Heart sounds: Normal heart sounds. No murmur heard.    No gallop.  Pulmonary:     Effort: Tachypnea, prolonged expiration and respiratory distress present.     Breath sounds: No  stridor. Examination of the right-lower field reveals wheezing. Examination of the left-lower field reveals wheezing. Wheezing present. No decreased breath sounds, rhonchi or rales.  Abdominal:     General: There is no distension.     Palpations: Abdomen is soft.     Tenderness: There is no abdominal tenderness. There is no rebound.  Musculoskeletal:        General: No tenderness. Normal range of motion.     Cervical back: Normal range of motion and neck supple.  Skin:    General: Skin is warm and dry.     Findings: No abrasion  or rash.  Neurological:     Mental Status: She is alert and oriented to person, place, and time. Mental status is at baseline.     GCS: GCS eye subscore is 4. GCS verbal subscore is 5. GCS motor subscore is 6.     Cranial Nerves: No cranial nerve deficit.     Sensory: No sensory deficit.     Motor: Motor function is intact.  Psychiatric:        Attention and Perception: Attention normal.        Speech: Speech normal.        Behavior: Behavior normal.     ED Results / Procedures / Treatments   Labs (all labs ordered are listed, but only abnormal results are displayed) Labs Reviewed  CBC WITH DIFFERENTIAL/PLATELET - Abnormal; Notable for the following components:      Result Value   Hemoglobin 10.8 (*)    HCT 34.5 (*)    All other components within normal limits  COMPREHENSIVE METABOLIC PANEL WITH GFR - Abnormal; Notable for the following components:   CO2 21 (*)    AST 13 (*)    All other components within normal limits  CBG MONITORING, ED - Abnormal; Notable for the following components:   Glucose-Capillary 103 (*)    All other components within normal limits  BRAIN NATRIURETIC PEPTIDE  TROPONIN I (HIGH SENSITIVITY)  TROPONIN I (HIGH SENSITIVITY)    EKG EKG Interpretation Date/Time:  Friday May 23 2023 08:25:08 EDT Ventricular Rate:  103 PR Interval:  142 QRS Duration:  82 QT Interval:  340 QTC Calculation: 445 R Axis:   62  Text  Interpretation: Sinus tachycardia Otherwise normal ECG When compared with ECG of 25-Feb-2023 17:04, PREVIOUS ECG IS PRESENT No significant change since last tracing Confirmed by Zackowski, Scott [phone]) on 05/23/2023 8:38:25 AM  Radiology DG Chest 2 View Result Date: 05/23/2023 CLINICAL DATA:  Chest pain, shortness of breath. EXAM: CHEST - 2 VIEW COMPARISON:  February 25, 2023. FINDINGS: The heart size and mediastinal contours are within normal limits. Both lungs are clear. The visualized skeletal structures are unremarkable. IMPRESSION: No active cardiopulmonary disease. Electronically Signed   By: Rosalene Colon M.D.   On: 05/23/2023 09:35    Procedures Procedures    Medications Ordered in ED Medications  methylPREDNISolone  sodium succinate (SOLU-MEDROL ) 125 mg/2 mL injection 125 mg (has no administration in time range)  albuterol  (PROVENTIL ) (2.5 MG/3ML) 0.083% nebulizer solution 5 mg (has no administration in time range)  ipratropium (ATROVENT ) nebulizer solution 0.5 mg (has no administration in time range)    ED Course/ Medical Decision Making/ A&P                                 Medical Decision Making Amount and/or Complexity of Data Reviewed Labs: ordered. Radiology: ordered.  Risk Prescription drug management.   Patient here with shortness of breath and treated for possible COPD exacerbation with albuterol  along with Atrovent  and Solu-Medrol .  Her EKG shows sinus tachycardia.  Chest x-ray did not show any acute infiltrates.  Patient family tachypneic and concern for possible pulmonary embolus.  CT angio chest showed no signs of PE at this time.  Low suspicion for ACS.  Troponins performed and showed no evidence of this.  Her hemoglobin is stable at 10.8.  Patient's BNP is normal.  Do not think this is CHF.  Doubt anemia as cause of her symptoms.  Suspect pulmonary etiology.  Still tachypneic after treatments and required mission.  Will consult hospitalist team   CRITICAL  CARE Performed by: Eden Goodpasture Total critical care time: 60 minutes Critical care time was exclusive of separately billable procedures and treating other patients. Critical care was necessary to treat or prevent imminent or life-threatening deterioration. Critical care was time spent personally by me on the following activities: development of treatment plan with patient and/or surrogate as well as nursing, discussions with consultants, evaluation of patient's response to treatment, examination of patient, obtaining history from patient or surrogate, ordering and performing treatments and interventions, ordering and review of laboratory studies, ordering and review of radiographic studies, pulse oximetry and re-evaluation of patient's condition.         Final Clinical Impression(s) / ED Diagnoses Final diagnoses:  None    Rx / DC Orders ED Discharge Orders     None         Lind Repine, MD 05/23/23 [phone]

## 2023-05-23 NOTE — ED Provider Triage Note (Addendum)
 Emergency Medicine Provider Triage Evaluation Note  Cesily Cuoco , a 53 y.o. female  was evaluated in triage.  Pt complains of shortness of breath productive cough and having difficulty getting it.  Patient concerned about possible CHF or pneumonia.  Review of Systems  Positive: Shortness of breath chest pain Negative: Difficulty swallowing  Physical Exam  BP (!) 132/94   Pulse (!) 102   Temp 98.4 F (36.9 C)   Resp (!) 22   Ht 1.753 m (5\' 9" )   Wt (!) 140.6 kg   SpO2 97%   BMI 45.78 kg/m  Gen: Awake, no distress nontoxic no acute distress. Resp: Lungs clear to auscultation bilaterally but patient working hard to breathe. MSK: Moves extremities without difficulty  Other: Throat is clear moist uvula is little inflamed but it is midline.  No significant erythema or exudate.  Medical Decision Making  Medically screening exam initiated at 8:39 AM.  Appropriate orders placed.  Merita Staples was informed that the remainder of the evaluation will be completed by another provider, this initial triage assessment does not replace that evaluation, and the importance of remaining in the ED until their evaluation is complete.  Patient's presentation could be consistent with CHF or pneumonia.  Also she is having chest pain.  Will get chest x-ray will get troponins basic labs BMP and patient's EKG was sinus tachycardia but no acute changes compared to previous.  In addition patient just notified us  that she is normally on 3 L of oxygen  at all times.  When I was seeing her she was not on oxygen  but her oxygen  saturations are 97%.  We have started her on oxygen  which makes her feel better.  This may explain why she was a little bit tachypneic.    Deaja Rizo, MD 05/23/23 1610    Erikah Thumm, MD 05/23/23 [phone]

## 2023-05-23 NOTE — Progress Notes (Signed)
 Virtual Visit via Video Note  I connected with Neila Morel on 05/23/23 at  8:00 AM EDT by a video enabled telemedicine application and verified that I am speaking with the correct person using two identifiers.  Location: Patient: ambulance Provider: home office   I discussed the limitations of evaluation and management by telemedicine and the availability of in person appointments. The patient expressed understanding and agreed to proceed.   I discussed the assessment and treatment plan with the patient. The patient was provided an opportunity to ask questions and all were answered. The patient agreed with the plan and demonstrated an understanding of the instructions.   The patient was advised to call back or seek an in-person evaluation if the symptoms worsen or if the condition fails to improve as anticipated.  I provided 20 minutes of non-face-to-face time during this encounter.  THERAPIST PROGRESS NOTE  Session Time: 8:00 AM to 8:20 AM  Participation Level: Active  Behavioral Response: CasualAlertAnxious  Type of Therapy: Individual Therapy  Treatment Goals addressed: patient continue to utilize therapy for  supportive and strength-based interventions, provide treatment interventions in the context of patient continuing to seek help and cope with medical issues, anxiety, triggers, coping with mental health symptoms, trauma, utilize therapy as a way for patient to focus on working through current stressors that also helps to distract from pain  ProgressTowards Goals: Progressing-patient has ongoing medical issues today and an ambulance assess very helpful to have supportive strength-based interventions from therapist  Interventions: Solution Focused, Strength-based, Supportive, and Other: coping  Summary: Laurann Mcmorris is a 53 y.o. female who presents with can't catch her breath in the ambulance difficult to understand what she is saying, has oxygen  mask over her mouth.Therapist  didn't want her to talk couldn't catch her breath.  Short session where provided supportive and strength-based interventions.  Suicidal/Homicidal: No  Plan: Return again in 2 weeks.2.Work on ongoing coping of stressors and mental health symptoms  Diagnosis: Major depressive disorder, recurrent, moderate, generalized anxiety disorder, chronic PTSD, panic attacks  Collaboration of Care: Other none needed  Patient/Guardian was advised Release of Information must be obtained prior to any record release in order to collaborate their care with an outside provider. Patient/Guardian was advised if they have not already done so to contact the registration department to sign all necessary forms in order for us  to release information regarding their care.   Consent: Patient/Guardian gives verbal consent for treatment and assignment of benefits for services provided during this visit. Patient/Guardian expressed understanding and agreed to proceed.   Dallie Duel, LCSW 05/23/2023

## 2023-05-24 DIAGNOSIS — J441 Chronic obstructive pulmonary disease with (acute) exacerbation: Secondary | ICD-10-CM | POA: Diagnosis not present

## 2023-05-24 LAB — CBC
HCT: 35.7 % — ABNORMAL LOW (ref 36.0–46.0)
Hemoglobin: 11.4 g/dL — ABNORMAL LOW (ref 12.0–15.0)
MCH: 26.3 pg (ref 26.0–34.0)
MCHC: 31.9 g/dL (ref 30.0–36.0)
MCV: 82.3 fL (ref 80.0–100.0)
Platelets: 391 10*3/uL (ref 150–400)
RBC: 4.34 MIL/uL (ref 3.87–5.11)
RDW: 15.1 % (ref 11.5–15.5)
WBC: 12.5 10*3/uL — ABNORMAL HIGH (ref 4.0–10.5)
nRBC: 0 % (ref 0.0–0.2)

## 2023-05-24 LAB — HIV ANTIBODY (ROUTINE TESTING W REFLEX): HIV Screen 4th Generation wRfx: NONREACTIVE

## 2023-05-24 LAB — BASIC METABOLIC PANEL WITH GFR
Anion gap: 12 (ref 5–15)
BUN: 9 mg/dL (ref 6–20)
CO2: 21 mmol/L — ABNORMAL LOW (ref 22–32)
Calcium: 9.2 mg/dL (ref 8.9–10.3)
Chloride: 106 mmol/L (ref 98–111)
Creatinine, Ser: 0.9 mg/dL (ref 0.44–1.00)
GFR, Estimated: >60 mL/min (ref >60)
Glucose, Bld: 162 mg/dL — ABNORMAL HIGH (ref 70–99)
Potassium: 4.2 mmol/L (ref 3.5–5.1)
Sodium: 139 mmol/L (ref 135–145)

## 2023-05-24 MED ORDER — IPRATROPIUM-ALBUTEROL 0.5-2.5 (3) MG/3ML IN SOLN
3.0000 mL | Freq: Two times a day (BID) | RESPIRATORY_TRACT | Status: DC
  Administered 2023-05-24 – 2023-05-25 (×3): 3 mL via RESPIRATORY_TRACT
  Filled 2023-05-24 (×2): qty 3

## 2023-05-24 MED ORDER — HYDROXYZINE HCL 25 MG PO TABS
25.0000 mg | ORAL_TABLET | Freq: Once | ORAL | Status: AC
  Administered 2023-05-24: 25 mg via ORAL
  Filled 2023-05-24: qty 1

## 2023-05-24 NOTE — Plan of Care (Signed)
  Problem: Education: Goal: Knowledge of General Education information will improve Description Including pain rating scale, medication(s)/side effects and non-pharmacologic comfort measures Outcome: Progressing   Problem: Health Behavior/Discharge Planning: Goal: Ability to manage health-related needs will improve Outcome: Progressing   Problem: Clinical Measurements: Goal: Ability to maintain clinical measurements within normal limits will improve Outcome: Progressing   Problem: Clinical Measurements: Goal: Respiratory complications will improve Outcome: Progressing   Problem: Activity: Goal: Risk for activity intolerance will decrease Outcome: Progressing   Problem: Nutrition: Goal: Adequate nutrition will be maintained Outcome: Progressing   

## 2023-05-24 NOTE — Plan of Care (Signed)
  Problem: Skin Integrity: Goal: Risk for impaired skin integrity will decrease Outcome: Progressing   Problem: Education: Goal: Knowledge of disease or condition will improve Outcome: Progressing   Problem: Respiratory: Goal: Ability to maintain a clear airway will improve Outcome: Progressing Goal: Levels of oxygenation will improve Outcome: Progressing

## 2023-05-24 NOTE — Progress Notes (Signed)
 PROGRESS NOTE    Lindsay [name]  NWG:[id number] DOB: 04/15/1970 DOA: 05/23/2023 PCP: Tretha Fu, MD    Brief Narrative:  53 year old with history of anxiety depression PTSD, COPD and chronic hypoxemia on 2 to 3 L of oxygen  at home, sleep apnea untreated, pulmonary arterial hypertension who presented with about 4 days of dry cough, low-grade fever and shortness of breath not relieved with home nebulizer therapy.  She has some congestion symptoms for the last 2 weeks.  In the emergency room afebrile.  On 2 L oxygen .  Mildly tachypneic.  She was found to be significantly wheezing despite multiple rounds of nebulizer therapy in the ER so admission requested.  CT scan and chest x-ray without acute findings.  Subjective: Patient seen and examined.  Still wheezing and dry cough but feels slightly better than yesterday.  Afebrile overnight.  No sputum production.  Does not use CPAP at home. Assessment & Plan:   COPD with acute exacerbation: bronchodilator therapy, IV steroids, inhalational steroids, scheduled and as needed bronchodilators, deep breathing exercises, incentive spirometry, chest physiotherapy. Antibiotics due to severity of symptoms.  Started on Rocephin .  Will continue today.  Anticipate 5 days of antibiotics. Supplemental oxygen  to keep saturations more than 90%. Symptoms started more than 2 weeks ago, patient was not screened for COVID, influenza and RSV.  Sleep apnea: Untreated.  Exacerbating current issues.  She is following up with pulmonary for new CPAP.  Chronic heart failure with preserved ejection fraction/pulmonary hypertension: Currently compensated.  On torsemide  and Entresto  that is continued.  Needs to treat her sleep apnea.  Anxiety depression: On Rexulti , doxepin  and Lamictal .  Chronic pain syndrome: Outpatient pain medications oxycodone  and tizanidine .   DVT prophylaxis: Lovenox  subcu   Code Status: Full code Family Communication: None at the  bedside Disposition Plan: Status is: Inpatient Remains inpatient appropriate because: Significant symptoms, inpatient treatment, respiratory therapy     Consultants:  None  Procedures:  None  Antimicrobials:  Rocephin  4/25---     Objective: Vitals:   05/23/23 2318 05/24/23 0407 05/24/23 0744 05/24/23 0834  BP: 135/80 115/74 131/81   Pulse: (!) 102 98 98 92  Resp: 16 17 20 18   Temp: 98.8 F (37.1 C) 98.3 F (36.8 C) 98 F (36.7 C)   TempSrc: Oral Oral Oral   SpO2: 96% 100% 97% 98%  Weight:      Height:       No intake or output data in the 24 hours ending 05/24/23 1128 Filed Weights   05/23/23 0828  Weight: (!) 140.6 kg    Examination:  General exam: Appears fairly comfortable on interview. Respiratory system: Scattered expiratory wheezes.  Good air entry bilateral.  On 2 L oxygen . Cardiovascular system: S1 & S2 heard, RRR.  Gastrointestinal system: Soft.  Nontender.  Bowel sound present. Central nervous system: Alert and oriented. No focal neurological deficits. Extremities: Symmetric 5 x 5 power. Skin: No rashes, lesions or ulcers Psychiatry: Judgement and insight appear normal. Mood & affect appropriate.     Data Reviewed: I have personally reviewed following labs and imaging studies  CBC: Recent Labs  Lab 05/23/23 0853 05/24/23 0642  WBC 9.6 12.5*  NEUTROABS 6.0  --   HGB 10.8* 11.4*  HCT 34.5* 35.7*  MCV 83.9 82.3  PLT 377 391   Basic Metabolic Panel: Recent Labs  Lab 05/23/23 0853 05/24/23 0642  NA 141 139  K 4.0 4.2  CL 111 106  CO2 21* 21*  GLUCOSE 95 162*  BUN  8 9  CREATININE 0.96 0.90  CALCIUM  8.9 9.2   GFR: Estimated Creatinine Clearance: 109.6 mL/min (by C-G formula based on SCr of 0.9 mg/dL). Liver Function Tests: Recent Labs  Lab 05/23/23 0853  AST 13*  ALT 11  ALKPHOS 71  BILITOT 0.4  PROT 6.9  ALBUMIN  3.6   No results for input(s): "LIPASE", "AMYLASE" in the last 168 hours. No results for input(s): "AMMONIA"  in the last 168 hours. Coagulation Profile: No results for input(s): "INR", "PROTIME" in the last 168 hours. Cardiac Enzymes: No results for input(s): "CKTOTAL", "CKMB", "CKMBINDEX", "TROPONINI" in the last 168 hours. BNP (last 3 results) No results for input(s): "PROBNP" in the last 8760 hours. HbA1C: No results for input(s): "HGBA1C" in the last 72 hours. CBG: Recent Labs  Lab 05/23/23 0854  GLUCAP 103*   Lipid Profile: No results for input(s): "CHOL", "HDL", "LDLCALC", "TRIG", "CHOLHDL", "LDLDIRECT" in the last 72 hours. Thyroid  Function Tests: No results for input(s): "TSH", "T4TOTAL", "FREET4", "T3FREE", "THYROIDAB" in the last 72 hours. Anemia Panel: No results for input(s): "VITAMINB12", "FOLATE", "FERRITIN", "TIBC", "IRON ", "RETICCTPCT" in the last 72 hours. Sepsis Labs: No results for input(s): "PROCALCITON", "LATICACIDVEN" in the last 168 hours.  No results found for this or any previous visit (from the past 240 hours).       Radiology Studies: CT Angio Chest PE W/Cm &/Or Wo Cm Result Date: 05/23/2023 CLINICAL DATA:  Shortness of breath since Tuesday. Chest pain and cough. EXAM: CT ANGIOGRAPHY CHEST WITH CONTRAST TECHNIQUE: Multidetector CT imaging of the chest was performed using the standard protocol during bolus administration of intravenous contrast. Multiplanar CT image reconstructions and MIPs were obtained to evaluate the vascular anatomy. RADIATION DOSE REDUCTION: This exam was performed according to the departmental dose-optimization program which includes automated exposure control, adjustment of the mA and/or kV according to patient size and/or use of iterative reconstruction technique. CONTRAST:  75mL OMNIPAQUE  IOHEXOL  350 MG/ML SOLN COMPARISON:  Chest radiograph of earlier today. ED note of earlier today. Most recent CT of 01/12/2023. FINDINGS: Cardiovascular: The quality of this exam for evaluation of pulmonary embolism is poor. The bolus is poorly timed, with  contrast centered in the SVC. No pulmonary embolism to the lobar level. To the lung bases, no large segmental embolism. Aortic atherosclerosis. Mild cardiomegaly, without pericardial effusion. Pulmonary artery enlargement, outflow tract 3.7 cm. Mediastinum/Nodes: No mediastinal or hilar adenopathy. Lungs/Pleura: No pleural fluid. Mild left hemidiaphragm elevation with subsegmental atelectasis and volume loss at the left lung base. 5 mm left lower lobe pulmonary nodule on 67/6 is unchanged; can be presumed benign and do/does not warrant imaging follow-up per Fleischner criteria. Upper Abdomen: Normal imaged portions of the liver, spleen, stomach, pancreas, adrenal glands, kidneys. Musculoskeletal: No acute osseous abnormality. Mild convex right thoracic spine curvature. Review of the MIP images confirms the above findings. IMPRESSION: 1. Suboptimal bolus timing. No pulmonary embolism with significant limitations above. 2. No explanation for patient's symptoms. 3. Pulmonary artery enlargement suggests pulmonary arterial hypertension. 4.  Aortic Atherosclerosis (ICD10-I70.0). Electronically Signed   By: Lore Rode M.D.   On: 05/23/2023 18:27   DG Chest 2 View Result Date: 05/23/2023 CLINICAL DATA:  Chest pain, shortness of breath. EXAM: CHEST - 2 VIEW COMPARISON:  February 25, 2023. FINDINGS: The heart size and mediastinal contours are within normal limits. Both lungs are clear. The visualized skeletal structures are unremarkable. IMPRESSION: No active cardiopulmonary disease. Electronically Signed   By: Rosalene Colon M.D.   On: 05/23/2023  09:35        Scheduled Meds:  atorvastatin   40 mg Oral Daily   brexpiprazole   3 mg Oral Daily   doxepin   25 mg Oral QHS   enoxaparin  (LOVENOX ) injection  70 mg Subcutaneous Q24H   ipratropium-albuterol   3 mL Nebulization BID   lamoTRIgine   50 mg Oral BID   methylPREDNISolone  (SOLU-MEDROL ) injection  125 mg Intravenous Q12H   Followed by   Cecily Cohen ON 05/25/2023]  predniSONE   40 mg Oral Q breakfast   pantoprazole   40 mg Oral Daily   sacubitril -valsartan   1 tablet Oral BID   sodium chloride  flush  3 mL Intravenous Q12H   torsemide   20 mg Oral BID   Continuous Infusions:  cefTRIAXone  (ROCEPHIN )  IV Stopped (05/23/23 2147)     LOS: 1 day    Time spent: 52 minutes    Vada Garibaldi, MD [facility]

## 2023-05-25 DIAGNOSIS — J441 Chronic obstructive pulmonary disease with (acute) exacerbation: Secondary | ICD-10-CM | POA: Diagnosis not present

## 2023-05-25 LAB — CBC
HCT: 34.1 % — ABNORMAL LOW (ref 36.0–46.0)
Hemoglobin: 10.8 g/dL — ABNORMAL LOW (ref 12.0–15.0)
MCH: 26.1 pg (ref 26.0–34.0)
MCHC: 31.7 g/dL (ref 30.0–36.0)
MCV: 82.4 fL (ref 80.0–100.0)
Platelets: 407 10*3/uL — ABNORMAL HIGH (ref 150–400)
RBC: 4.14 MIL/uL (ref 3.87–5.11)
RDW: 15.1 % (ref 11.5–15.5)
WBC: 15 10*3/uL — ABNORMAL HIGH (ref 4.0–10.5)
nRBC: 0 % (ref 0.0–0.2)

## 2023-05-25 LAB — BASIC METABOLIC PANEL WITH GFR
Anion gap: 11 (ref 5–15)
BUN: 12 mg/dL (ref 6–20)
CO2: 27 mmol/L (ref 22–32)
Calcium: 8.6 mg/dL — ABNORMAL LOW (ref 8.9–10.3)
Chloride: 100 mmol/L (ref 98–111)
Creatinine, Ser: 0.95 mg/dL (ref 0.44–1.00)
GFR, Estimated: >60 mL/min (ref >60)
Glucose, Bld: 199 mg/dL — ABNORMAL HIGH (ref 70–99)
Potassium: 3.9 mmol/L (ref 3.5–5.1)
Sodium: 138 mmol/L (ref 135–145)

## 2023-05-25 MED ORDER — IPRATROPIUM-ALBUTEROL 0.5-2.5 (3) MG/3ML IN SOLN: 3.0000 mL | Freq: Two times a day (BID) | RESPIRATORY_TRACT | 0 refills | Status: DC

## 2023-05-25 MED ORDER — PREDNISONE 10 MG PO TABS: ORAL_TABLET | ORAL | 0 refills | Status: DC

## 2023-05-25 MED ORDER — AMOXICILLIN-POT CLAVULANATE 875-125 MG PO TABS
1.0000 | ORAL_TABLET | Freq: Two times a day (BID) | ORAL | 0 refills | Status: AC
Start: 2023-05-25 — End: 2023-05-30

## 2023-05-25 MED ORDER — SUMATRIPTAN SUCCINATE 25 MG PO TABS
25.0000 mg | ORAL_TABLET | Freq: Once | ORAL | Status: AC
  Administered 2023-05-25: 25 mg via ORAL
  Filled 2023-05-25: qty 1

## 2023-05-25 MED ORDER — GUAIFENESIN 100 MG/5ML PO LIQD: 10.0000 mL | Freq: Four times a day (QID) | ORAL | 0 refills | Status: DC | PRN

## 2023-05-25 NOTE — Plan of Care (Signed)

## 2023-05-25 NOTE — Progress Notes (Signed)
 Discharge summary (AVS) provided to pt with instructions. Pt verbalized understanding of instructions NO complaints. Taxi voucher & waiver signed by pt, and placed in the chart. Pt d/c to home as ordered, She remains alert/oriented in no apparent distress. Attending nurse made aware of above.

## 2023-05-25 NOTE — Discharge Summary (Signed)
 Physician Discharge Summary  Lindsay [name] ZOX:[id number] DOB: 1970-03-03 DOA: 05/23/2023  PCP: Tretha Fu, MD  Admit date: 05/23/2023 Discharge date: 05/25/2023  Admitted From: Home Disposition: Home  Recommendations for Outpatient Follow-up:  Follow up with PCP in 1-2 weeks Follow-up with pulmonary, also follow-up with your CPAP provider  Home Health: N/A Equipment/Devices: N/A  Discharge Condition: Stable CODE STATUS: Full code Diet recommendation: Low-salt and low-carb diet  Discharge summary: 53 year old with history of anxiety, depression PTSD, COPD and chronic hypoxemia on 2 to 3 L of oxygen  at home, sleep apnea untreated, pulmonary arterial hypertension who presented with about 4 days of dry cough, low-grade fever and shortness of breath not relieved with home nebulizer therapy.  She has some congestion symptoms for the last 2 weeks.  In the emergency room afebrile.  On 2 L oxygen .  Mildly tachypneic.  She was found to be significantly wheezing despite multiple rounds of nebulizer therapy in the ER so admission requested.  CT scan and chest x-ray without acute findings.   Patient was admitted and treated with bronchodilator therapy and IV steroids, chest physiotherapy with clinical improvement.  Most of her symptoms have improved now.  She has some remaining airway irritation otherwise mostly improved.    COPD with acute exacerbation, multifactorial shortness of breath.  Untreated sleep apnea. Adequately improved. Prednisone  slow taper over next 9 days  Patient is already on Symbicort  that she will take scheduled doses. Patient uses albuterol  inhaler as needed, reported better response with DuoNebs.  Will prescribe DuoNebs twice daily along with her as needed albuterol . Discussed and counseled to continue doing incentive spirometry at home.  Over-the-counter cough medications. Her CPAP machine is broken, she should follow-up with DME provider.  She also has a scheduled  follow-up with [place] [facility].  Chronic medical issues including Chronic heart failure with ejection fraction preserved Anxiety and depression Chronic pain syndrome Largely stable.  Continue home medications.  Medically stable to discharge home with outpatient follow-up.  Discharge Diagnoses:  Principal Problem:   COPD with acute exacerbation (HCC) Active Problems:   Major depressive disorder, recurrent episode, moderate (HCC)   Generalized anxiety disorder   Chronic respiratory failure with hypoxia (HCC)   OSA (obstructive sleep apnea)   Chronic heart failure (HCC)   History of CVA (cerebrovascular accident)    Discharge Instructions  Discharge Instructions     Diet - low sodium heart healthy   Complete by: As directed    Diet Carb Modified   Complete by: As directed    Increase activity slowly   Complete by: As directed       Allergies as of 05/25/2023       Reactions   Lisinopril  Rash, Cough        Medication List     STOP taking these medications    tiZANidine  4 MG tablet Commonly known as: ZANAFLEX        TAKE these medications    Accu-Chek Guide Me w/Device Kit Dispense one device   Accu-Chek Guide test strip Generic drug: glucose blood USE ONE TEST STRIP TO CHECK BLOOD SUGARS ONCE A DAY AS NEEDED   Accu-Chek Softclix Lancets lancets USE ONE LANCET TO CHECK BLOOD SUGAR ONE A DAY AS NEEDED   acetaminophen  500 MG tablet Commonly known as: TYLENOL  Take 1 tablet (500 mg total) by mouth every 6 (six) hours as needed. What changed: reasons to take this   albuterol  108 (90 Base) MCG/ACT inhaler Commonly known as: VENTOLIN  HFA TAKE 2 PUFFS  BY MOUTH EVERY 6 HOURS AS NEEDED FOR WHEEZE OR SHORTNESS OF BREATH   albuterol  (2.5 MG/3ML) 0.083% nebulizer solution Commonly known as: PROVENTIL  Take 3 mLs (2.5 mg total) by nebulization every 6 (six) hours as needed for wheezing or shortness of breath.   amoxicillin -clavulanate 875-125 MG  tablet Commonly known as: AUGMENTIN  Take 1 tablet by mouth 2 (two) times daily for 5 days.   atorvastatin  40 MG tablet Commonly known as: LIPITOR TAKE 1 TABLET BY MOUTH EVERY DAY   Azelastine  HCl 137 MCG/SPRAY Soln PLACE 1 SPRAY INTO BOTH NOSTRILS 2 (TWO) TIMES DAILY. USE IN EACH NOSTRIL AS DIRECTED What changed: See the new instructions.   cetirizine  10 MG tablet Commonly known as: ZYRTEC  Take 1 tablet (10 mg total) by mouth daily. What changed: when to take this   cyclobenzaprine  10 MG tablet Commonly known as: FLEXERIL  Take 10 mg by mouth 3 (three) times daily.   dicyclomine  10 MG capsule Commonly known as: BENTYL  TAKE 2 CAPSULES (20 MG TOTAL) BY MOUTH 3 (THREE) TIMES DAILY AS NEEDED FOR SPASMS (ABDOMINAL PAIN).   doxepin  25 MG capsule Commonly known as: SINEQUAN  Take 1 capsule (25 mg total) by mouth at bedtime.   Entresto  24-26 MG Generic drug: sacubitril -valsartan  TAKE 1 TABLET BY MOUTH TWICE A DAY   fluticasone  50 MCG/ACT nasal spray Commonly known as: FLONASE  SPRAY 1 SPRAY INTO EACH NOSTRIL DAILY What changed: See the new instructions.   guaiFENesin  100 MG/5ML liquid Commonly known as: ROBITUSSIN Take 10 mLs by mouth every 6 (six) hours as needed for cough or to loosen phlegm.   hydrocortisone  2.5 % rectal cream Commonly known as: ANUSOL -HC Place 1 Application rectally daily as needed for hemorrhoids.   ibuprofen  800 MG tablet Commonly known as: ADVIL  Take 800 mg by mouth every 8 (eight) hours as needed for mild pain (pain score 1-3).   ipratropium 0.06 % nasal spray Commonly known as: ATROVENT  Place 2 sprays into both nostrils daily as needed for rhinitis.   ipratropium-albuterol  0.5-2.5 (3) MG/3ML Soln Commonly known as: DUONEB Take 3 mLs by nebulization 2 (two) times daily.   lamoTRIgine  100 MG tablet Commonly known as: LaMICtal  Take 0.5 tablets (50 mg total) by mouth 2 (two) times daily.   Linzess  290 MCG Caps capsule Generic drug:  linaclotide  TAKE 1 CAPSULE BY MOUTH DAILY BEFORE BREAKFAST.   meclizine  25 MG tablet Commonly known as: ANTIVERT  Take 1 tablet (25 mg total) by mouth 3 (three) times daily as needed for dizziness. What changed: when to take this   megestrol  40 MG tablet Commonly known as: MEGACE  Take 2 tablets (80 mg total) by mouth daily. What changed:  when to take this reasons to take this   metoprolol  tartrate 25 MG tablet Commonly known as: LOPRESSOR  Take 25 mg by mouth daily.   omeprazole  40 MG capsule Commonly known as: PRILOSEC TAKE 1 CAPSULE (40 MG TOTAL) BY MOUTH DAILY.   ondansetron  8 MG disintegrating tablet Commonly known as: ZOFRAN -ODT Take 1 tablet (8 mg total) by mouth every 8 (eight) hours as needed. What changed: reasons to take this   oxyCODONE  5 MG immediate release tablet Commonly known as: Oxy IR/ROXICODONE  Take 5 mg by mouth 2 (two) times daily.   Ozempic  (2 MG/DOSE) 8 MG/3ML Sopn Generic drug: Semaglutide  (2 MG/DOSE) INJECT 2 MG INTO THE SKIN ONCE A WEEK.   predniSONE  10 MG tablet Commonly known as: DELTASONE  3 tabs daily for 3 days 2 tabs daily for 3 days 1 tabs  daily for 3 days Start taking on: May 26, 2023   promethazine  25 MG tablet Commonly known as: PHENERGAN  Take 1 tablet (25 mg total) by mouth every 6 (six) hours as needed. What changed: reasons to take this   Rexulti  3 MG Tabs Generic drug: Brexpiprazole  Take 1 tablet (3 mg total) by mouth daily.   Safety Seal Miscellaneous Misc Apply 1 Application topically in the morning. Medication Name; AA Gel   Symbicort  160-4.5 MCG/ACT inhaler Generic drug: budesonide -formoterol  Inhale 2 puffs into the lungs 2 (two) times daily.   torsemide  20 MG tablet Commonly known as: DEMADEX  TAKE 1 TABLET BY MOUTH TWICE A DAY        Allergies  Allergen Reactions   Lisinopril  Rash and Cough    Consultations: None   Procedures/Studies: CT Angio Chest PE W/Cm &/Or Wo Cm Result Date:  05/23/2023 CLINICAL DATA:  Shortness of breath since Tuesday. Chest pain and cough. EXAM: CT ANGIOGRAPHY CHEST WITH CONTRAST TECHNIQUE: Multidetector CT imaging of the chest was performed using the standard protocol during bolus administration of intravenous contrast. Multiplanar CT image reconstructions and MIPs were obtained to evaluate the vascular anatomy. RADIATION DOSE REDUCTION: This exam was performed according to the departmental dose-optimization program which includes automated exposure control, adjustment of the mA and/or kV according to patient size and/or use of iterative reconstruction technique. CONTRAST:  75mL OMNIPAQUE  IOHEXOL  350 MG/ML SOLN COMPARISON:  Chest radiograph of earlier today. ED note of earlier today. Most recent CT of 01/12/2023. FINDINGS: Cardiovascular: The quality of this exam for evaluation of pulmonary embolism is poor. The bolus is poorly timed, with contrast centered in the SVC. No pulmonary embolism to the lobar level. To the lung bases, no large segmental embolism. Aortic atherosclerosis. Mild cardiomegaly, without pericardial effusion. Pulmonary artery enlargement, outflow tract 3.7 cm. Mediastinum/Nodes: No mediastinal or hilar adenopathy. Lungs/Pleura: No pleural fluid. Mild left hemidiaphragm elevation with subsegmental atelectasis and volume loss at the left lung base. 5 mm left lower lobe pulmonary nodule on 67/6 is unchanged; can be presumed benign and do/does not warrant imaging follow-up per Fleischner criteria. Upper Abdomen: Normal imaged portions of the liver, spleen, stomach, pancreas, adrenal glands, kidneys. Musculoskeletal: No acute osseous abnormality. Mild convex right thoracic spine curvature. Review of the MIP images confirms the above findings. IMPRESSION: 1. Suboptimal bolus timing. No pulmonary embolism with significant limitations above. 2. No explanation for patient's symptoms. 3. Pulmonary artery enlargement suggests pulmonary arterial hypertension.  4.  Aortic Atherosclerosis (ICD10-I70.0). Electronically Signed   By: Lore Rode M.D.   On: 05/23/2023 18:27   DG Chest 2 View Result Date: 05/23/2023 CLINICAL DATA:  Chest pain, shortness of breath. EXAM: CHEST - 2 VIEW COMPARISON:  February 25, 2023. FINDINGS: The heart size and mediastinal contours are within normal limits. Both lungs are clear. The visualized skeletal structures are unremarkable. IMPRESSION: No active cardiopulmonary disease. Electronically Signed   By: Rosalene Colon M.D.   On: 05/23/2023 09:35   DG Abd 1 View Result Date: 05/03/2023 CLINICAL DATA:  abdominal pain EXAM: ABDOMEN - 1 VIEW COMPARISON:  August 10, 2022 FINDINGS: Air and stool-filled nondilated loops of bowel. Moderate to large colonic stool burden diffusely throughout the colon. Pelvic phleboliths. Renal contours are obscured by overlapping bowel contents. Degenerative changes of the lumbar spine. Incomplete assessment of the lateral soft tissues. IMPRESSION: 1. Nonobstructive bowel gas pattern. 2. Moderate to large colonic stool burden. Electronically Signed   By: Clancy Crimes M.D.   On: 05/03/2023  15:23   US  Abdomen Limited RUQ (LIVER/GB) Result Date: 04/28/2023 CLINICAL DATA:  Abdominal pain. EXAM: ULTRASOUND ABDOMEN LIMITED RIGHT UPPER QUADRANT COMPARISON:  CT abdomen pelvis August 10, 2022 FINDINGS: Gallbladder: No gallstones or wall thickening visualized. No sonographic Murphy sign noted by sonographer. Common bile duct: Diameter: 6.3 mm. Liver: No focal lesion identified. Increased echotexture with nodular contour. Portal vein is patent on color Doppler imaging with normal direction of blood flow towards the liver. Other: None. IMPRESSION: 1. No acute abnormality identified. 2. Increased echotexture with nodular contour of the liver, suggestive of cirrhosis. Electronically Signed   By: Anna Barnes M.D.   On: 04/28/2023 08:58   (Echo, Carotid, EGD, Colonoscopy, ERCP)    Subjective: Patient seen and  examined.  She has some dry hacking cough otherwise denies any wheezing.  She has episodic shortness of breath especially when talking.  Feels comfortable with plan to go home with outpatient follow-up.   Discharge Exam: Vitals:   05/25/23 0711 05/25/23 0857  BP: 115/70   Pulse: 88   Resp:    Temp: 98.3 F (36.8 C)   SpO2: 100% 94%   Vitals:   05/24/23 1931 05/25/23 0448 05/25/23 0711 05/25/23 0857  BP: 126/77 114/74 115/70   Pulse: 94 (!) 102 88   Resp: 18 18    Temp: 98.2 F (36.8 C) 98.5 F (36.9 C) 98.3 F (36.8 C)   TempSrc: Oral Oral Oral   SpO2: 95% 100% 100% 94%  Weight:      Height:        General: Pt is alert, awake, not in acute distress.  On room air today at rest. Cardiovascular: RRR, S1/S2 +, no rubs, no gallops Respiratory: CTA bilaterally, no wheezing, no rhonchi, no added sounds. Abdominal: Soft, NT, ND, bowel sounds + Extremities: no edema, no cyanosis    The results of significant diagnostics from this hospitalization (including imaging, microbiology, ancillary and laboratory) are listed below for reference.     Microbiology: No results found for this or any previous visit (from the past 240 hours).   Labs: BNP (last 3 results) Recent Labs    01/12/23 1240 05/23/23 0853  BNP 36.3 16.0   Basic Metabolic Panel: Recent Labs  Lab 05/23/23 0853 05/24/23 0642 05/25/23 0434  NA 141 139 138  K 4.0 4.2 3.9  CL 111 106 100  CO2 21* 21* 27  GLUCOSE 95 162* 199*  BUN 8 9 12   CREATININE 0.96 0.90 0.95  CALCIUM  8.9 9.2 8.6*   Liver Function Tests: Recent Labs  Lab 05/23/23 0853  AST 13*  ALT 11  ALKPHOS 71  BILITOT 0.4  PROT 6.9  ALBUMIN  3.6   No results for input(s): "LIPASE", "AMYLASE" in the last 168 hours. No results for input(s): "AMMONIA" in the last 168 hours. CBC: Recent Labs  Lab 05/23/23 0853 05/24/23 0642 05/25/23 0434  WBC 9.6 12.5* 15.0*  NEUTROABS 6.0  --   --   HGB 10.8* 11.4* 10.8*  HCT 34.5* 35.7* 34.1*  MCV  83.9 82.3 82.4  PLT 377 391 407*   Cardiac Enzymes: No results for input(s): "CKTOTAL", "CKMB", "CKMBINDEX", "TROPONINI" in the last 168 hours. BNP: Invalid input(s): "POCBNP" CBG: Recent Labs  Lab 05/23/23 0854  GLUCAP 103*   D-Dimer No results for input(s): "DDIMER" in the last 72 hours. Hgb A1c No results for input(s): "HGBA1C" in the last 72 hours. Lipid Profile No results for input(s): "CHOL", "HDL", "LDLCALC", "TRIG", "CHOLHDL", "LDLDIRECT" in the last  72 hours. Thyroid  function studies No results for input(s): "TSH", "T4TOTAL", "T3FREE", "THYROIDAB" in the last 72 hours.  Invalid input(s): "FREET3" Anemia work up No results for input(s): "VITAMINB12", "FOLATE", "FERRITIN", "TIBC", "IRON ", "RETICCTPCT" in the last 72 hours. Urinalysis    Component Value Date/Time   COLORURINE YELLOW 01/13/2023 0401   APPEARANCEUR CLEAR 01/13/2023 0401   APPEARANCEUR Clear 04/23/2018 0935   LABSPEC 1.038 (H) 01/13/2023 0401   PHURINE 5.0 01/13/2023 0401   GLUCOSEU NEGATIVE 01/13/2023 0401   GLUCOSEU NEG mg/dL 16/10/9602 5409   HGBUR NEGATIVE 01/13/2023 0401   HGBUR negative 09/06/2008 1005   BILIRUBINUR NEGATIVE 01/13/2023 0401   BILIRUBINUR NEGATIVE 03/13/2022 0919   BILIRUBINUR Negative 04/23/2018 0935   KETONESUR NEGATIVE 01/13/2023 0401   PROTEINUR NEGATIVE 01/13/2023 0401   UROBILINOGEN 0.2 03/13/2022 0919   UROBILINOGEN 0.2 07/17/2014 0735   NITRITE NEGATIVE 01/13/2023 0401   LEUKOCYTESUR NEGATIVE 01/13/2023 0401   Sepsis Labs Recent Labs  Lab 05/23/23 0853 05/24/23 0642 05/25/23 0434  WBC 9.6 12.5* 15.0*   Microbiology No results found for this or any previous visit (from the past 240 hours).   Time coordinating discharge: 32 minutes  SIGNED:   Vada Garibaldi, MD  [facility] 05/25/2023, 10:06 AM

## 2023-05-25 NOTE — Progress Notes (Signed)
 AVS printed and placed with chart.  Patient has requested to eat lunch and take shower before discharge. IV removed; site clean, dry with no adverse reactions.

## 2023-05-27 ENCOUNTER — Telehealth: Payer: Self-pay | Admitting: *Deleted

## 2023-05-27 ENCOUNTER — Telehealth: Payer: Self-pay

## 2023-05-27 ENCOUNTER — Ambulatory Visit (INDEPENDENT_AMBULATORY_CARE_PROVIDER_SITE_OTHER): Admitting: Licensed Clinical Social Worker

## 2023-05-27 DIAGNOSIS — M25562 Pain in left knee: Secondary | ICD-10-CM | POA: Diagnosis not present

## 2023-05-27 DIAGNOSIS — F331 Major depressive disorder, recurrent, moderate: Secondary | ICD-10-CM | POA: Diagnosis not present

## 2023-05-27 DIAGNOSIS — J441 Chronic obstructive pulmonary disease with (acute) exacerbation: Secondary | ICD-10-CM

## 2023-05-27 DIAGNOSIS — F410 Panic disorder [episodic paroxysmal anxiety] without agoraphobia: Secondary | ICD-10-CM | POA: Diagnosis not present

## 2023-05-27 DIAGNOSIS — F4312 Post-traumatic stress disorder, chronic: Secondary | ICD-10-CM

## 2023-05-27 DIAGNOSIS — E1121 Type 2 diabetes mellitus with diabetic nephropathy: Secondary | ICD-10-CM

## 2023-05-27 DIAGNOSIS — J9611 Chronic respiratory failure with hypoxia: Secondary | ICD-10-CM

## 2023-05-27 DIAGNOSIS — F411 Generalized anxiety disorder: Secondary | ICD-10-CM

## 2023-05-27 DIAGNOSIS — G8929 Other chronic pain: Secondary | ICD-10-CM | POA: Diagnosis not present

## 2023-05-27 DIAGNOSIS — J449 Chronic obstructive pulmonary disease, unspecified: Secondary | ICD-10-CM

## 2023-05-27 NOTE — Progress Notes (Signed)
 Virtual Visit via Video Note  I connected with Lindsay [name] on 05/27/23 at  8:00 AM EDT by a video enabled telemedicine application and verified that I am speaking with the correct person using two identifiers.  Location: Patient: outside Provider: office   I discussed the limitations of evaluation and management by telemedicine and the availability of in person appointments. The patient expressed understanding and agreed to proceed.   I discussed the assessment and treatment plan with the patient. The patient was provided an opportunity to ask questions and all were answered. The patient agreed with the plan and demonstrated an understanding of the instructions.   The patient was advised to call back or seek an in-person evaluation if the symptoms worsen or if the condition fails to improve as anticipated.  I provided 40 minutes of non-face-to-face time during this encounter.  THERAPIST PROGRESS NOTE  Session Time: 8:00 AM to 8:40 AM  Participation Level: Active  Behavioral Response: CasualAlertAnxious and in session positive affect  Type of Therapy: Individual Therapy  Treatment Goals addressed: patient continue to utilize therapy for  supportive and strength-based interventions, provide treatment interventions in the context of patient continuing to seek help and cope with medical issues, anxiety, triggers, coping with mental health symptoms, trauma, utilize therapy as a way for patient to focus on working through current stressors that also helps to distract from pain  ProgressTowards Goals: Progressing-with all patient's ongoing medical issues assess therapeutic for patient to have supportive strength-based intervention, therapist look for ways to reframe her possible positive the new team at [facility] is moving faster addressing issues early on as a positive, positive she is going to look at torn meniscus today also took care of items that needed storage  Interventions: Solution  Focused, Strength-based, Supportive, Reframing, and Other: coping  Summary: Lindsay [name] is a 53 y.o. female who presents with when therapist saw her in ambulance on Friday difficult to know what is going on so therapist asked patient to explain and give therapist an update.  Patient says gradually losing breath since early last week and coughing and noticed a month ago short of breath. Doctor said lungs having hypertension artery trying to work and put out oxygen . The recommendations were make sure using machine for sleep apnea, steroid, antibiotics and breathing treatment. Plans to go to pulmonary doctor sooner. Panky in hospital a guy yelling somebody help him asked to please shut her door. Why not give him something to calm down. Didn't want to leave until 100% but with that going on decided to leave they were also ready to discharge her. Today has to call pulmonary doctor to get an appointment. Sister bought a Production designer, theatre/television/film. In terms of the lung the doctor said take a long time to clear up. With the GI doctor need clearance from cardiac doctor. Cleared her but has an appointment on May 9. Therapist and patient both agree for something like this good to see him in person. Also has to get clearance from pulmonary doctor. Bleeding internally too much why GI doctor ordered tests. Scary with all this. Scared with all this going on and then this man hollering in the hospital. Pain issues two torn places in knee and used to be one. [facility] lady is taken over the pain. Went to her last Thursday urine and nothing only OxyContin  which is the only thing supposed to be in there. She is going to order medicine OxyContin  Tylenol  to start on the 9th. Therapist shared her  perspective that it is positive torn meniscus looking at today [facility] Orthopedic surgeon. Pain doctor sent her today. Also want to treat back pain.  Therapist noted torn meniscus has been going on a long time so getting it looked at seems to her  something positive. Annual examination at [facility] didn't sign papers for exchange information between [facility] and [place]. Not happy about that.  Lenon Radar Brunson pain doctor. Therapist noted looking at torn meniscus as positive asked patient how she feels positive but has to see what happens. Early on some positive signs but still early on to say.     Suicidal/Homicidal: No  Plan: Return again in 1 weeks.2.  Therapist continue to be updated with medical issues provide supportive strength-based interventions highlight any progress is to help patient with coping  Diagnosis: Major depressive disorder, recurrent, moderate, generalized anxiety disorder, chronic PTSD, panic attacks  Collaboration of Care: Other none needed  Patient/Guardian was advised Release of Information must be obtained prior to any record release in order to collaborate their care with an outside provider. Patient/Guardian was advised if they have not already done so to contact the registration department to sign all necessary forms in order for us  to release information regarding their care.   Consent: Patient/Guardian gives verbal consent for treatment and assignment of benefits for services provided during this visit. Patient/Guardian expressed understanding and agreed to proceed.   Dallie Duel, LCSW 05/27/2023

## 2023-05-27 NOTE — Progress Notes (Signed)
   Telephone encounter was:  Successful.  Complex Care Management Note Care Guide Note  05/27/2023 Name: Lindsay [name] MRN: [id number] DOB: 1970/07/17  Lindsay [name] is a 53 y.o. year old female who is a primary care patient of Osei-Bonsu, Caretha Chapel, MD . The community resource team was consulted for assistance with  Moms Meals  SDOH screenings and interventions completed:  No        Care guide performed the following interventions: Patient provided with information about care guide support team and interviewed to confirm resource needs.Pt requesting information for moms meals. I explained the process and pt stated she will call them back to set that up.   Follow Up Plan:  No further follow up planned at this time. The patient has been provided with needed resources.  Encounter Outcome:  Patient Visit Completed   Azell Leopard [facility]  [facility] Guide, Phone: [phone] Fax: [phone] Website: [email or web address]

## 2023-05-27 NOTE — Progress Notes (Signed)
 Complex Care Management Note  Care Guide Note 05/27/2023 Name: Lindsay [name] MRN: [id number] DOB: 03/13/1970  Lindsay [name] is a 53 y.o. year old female who sees Osei-Bonsu, Caretha Chapel, MD for primary care. I reached out to Merita Staples by phone today to offer complex care management services.  Lindsay [name] was given information about Complex Care Management services today including:   The Complex Care Management services include support from the care team which includes your Nurse Care Manager, Clinical Social Worker, or Pharmacist.  The Complex Care Management team is here to help remove barriers to the health concerns and goals most important to you. Complex Care Management services are voluntary, and the patient may decline or stop services at any time by request to their care team member.   Complex Care Management Consent Status: Patient agreed to services and verbal consent obtained.   Follow up plan:  Telephone appointment with complex care management team member scheduled for:  4/30  Encounter Outcome:  Patient Scheduled  Barnie Bora  [facility]  [facility], [facility] Guide  Direct Dial: [phone]  Fax [phone]

## 2023-05-28 ENCOUNTER — Other Ambulatory Visit: Payer: Self-pay | Admitting: *Deleted

## 2023-05-28 ENCOUNTER — Other Ambulatory Visit: Payer: Self-pay

## 2023-05-28 DIAGNOSIS — J441 Chronic obstructive pulmonary disease with (acute) exacerbation: Secondary | ICD-10-CM

## 2023-05-28 NOTE — Patient Outreach (Signed)
 Complex Care Management   Visit Note  05/28/2023  Name:  Lindsay [name] MRN: [id number] DOB: 02-22-1970  Situation: Referral received for Complex Care Management related to Heart Failure, COPD, and fall with broken toes  I obtained verbal consent from Patient.  Visit completed with patient  on the phone  Patient report she is still having some shortness of breath, able to hold conversation today.  She does not feel she should have left the hospital as early as she did, has called pulmonology office to get appointment scheduled.  She will need at least a 3 day notice as she uses Medicaid transportation. State she is using her oxygen  as ordered, need new prescription sent to DME company, it's been 5 years since original order. She has had issues with this in the past, with no one coming back to pick her up after her appointments.  She has addressed this with Medicaid directly, will do so again.  She think she was active with [facility] in the past, would like to be reconnected.    She called [employer] when she was discharged to get mom's meals, but was told they needed proof she was hospitalized.  She still having issues with foot pain due to broken toes (follow up with [facility] in June), chronic back/knee pain (received knee injection yesterday and will sign pain management contract with [facility]), and yeast due to antibiotic use.   Background:   Past Medical History:  Diagnosis Date   Acid reflux    Anemia    Iron  Def   Anorexia    CHF (congestive heart failure) (HCC)    Chronic kidney disease    Nephrotic syndrome   Colon polyp 2009   Depression with anxiety    Edema leg    Fibroid tumor 04/2022   Fibromyalgia    Hemorrhoids    Hidradenitis suppurativa    Hypertension    IBS (irritable bowel syndrome)    Low back pain    Migraines    Morbidly obese (HCC)    Neuromuscular disorder (HCC)    fibromyalgia   Neuropathy    Panic attacks    Polyp of vocal cord or larynx    Stroke (HCC)     Tonsil pain     Assessment: Patient Reported Symptoms:  Cognitive Cognitive Status: Alert and oriented to person, place, and time, Normal speech and language skills Cognitive/Intellectual Conditions Management [RPT]: None reported or documented in medical history or problem list   Health Maintenance Behaviors: Annual physical exam, Healthy diet, Stress management Healing Pattern: Unsure Health Facilitated by: Pain control, Rest, Stress management  Neurological Neurological Review of Symptoms: No symptoms reported    HEENT HEENT Symptoms Reported: No symptoms reported      Cardiovascular Cardiovascular Symptoms Reported: Swelling in legs or feet Does patient have uncontrolled Hypertension?: No Cardiovascular Conditions: Heart failure  Respiratory Respiratory Symptoms Reported: Shortness of breath, Dry cough Respiratory Conditions: COPD, Shortness of breath Respiratory Self-Management Outcome: 3 (uncertain)  Endocrine Patient reports the following symptoms related to hypoglycemia or hyperglycemia : No symptoms reported    Gastrointestinal Gastrointestinal Symptoms Reported: Nausea, Constipation Additional Gastrointestinal Details: chronic constipation, on linzess , has zofran  for chronic nausea Gastrointestinal Conditions: Reflux/heartburn, Constipation Gastrointestinal Management Strategies: Medication therapy, Diet modification Gastrointestinal Self-Management Outcome: 4 (good) Nutrition Risk Screen (CP): Difficulty chewing/swallowing  Genitourinary Genitourinary Symptoms Reported: No symptoms reported    Integumentary Integumentary Symptoms Reported: No symptoms reported    Musculoskeletal Musculoskelatal Symptoms Reviewed: Difficulty walking, Unsteady gait  Additional Musculoskeletal Details: broken toes from fall in December Musculoskeletal Conditions: Back pain Musculoskeletal Management Strategies: Medical device Musculoskeletal Self-Management Outcome: 3  (uncertain) Falls in the past year?: Yes Number of falls in past year: 1 or less Was there an injury with Fall?: Yes Fall Risk Category Calculator: 2 Patient Fall Risk Level: Moderate Fall Risk Patient at Risk for Falls Due to: History of fall(s)  Psychosocial Psychosocial Symptoms Reported: Depression - if selected complete PHQ 2-9 Behavioral Health Conditions: Depression Major Change/Loss/Stressor/Fears (CP): Medical condition, self Quality of Family Relationships: involved Do you feel physically threatened by others?: No      05/28/2023    1:32 PM  Depression screen PHQ 2/9  Decreased Interest 1  Down, Depressed, Hopeless 1  PHQ - 2 Score 2  Altered sleeping 2  Tired, decreased energy 2  Change in appetite 1  Feeling bad or failure about yourself  0  Trouble concentrating 1  Moving slowly or fidgety/restless 1  Suicidal thoughts 0  PHQ-9 Score 9  Difficult doing work/chores Somewhat difficult    There were no vitals filed for this visit.  Medications Reviewed Today     Reviewed by Holland Lundborg, RN (Registered Nurse) on 05/28/23 at 1326  Med List Status: <None>   Medication Order Taking? Sig Documenting Provider Last Dose Status Informant  ACCU-CHEK GUIDE test strip [id number] Yes USE ONE TEST STRIP TO CHECK BLOOD SUGARS ONCE A DAY AS NEEDED Lasandra Points, MD Taking Active Self  Accu-Chek Softclix Lancets lancets [id number] Yes USE ONE LANCET TO CHECK BLOOD SUGAR ONE A DAY AS NEEDED Lasandra Points, MD Taking Active Self  acetaminophen  (TYLENOL ) 500 MG tablet [id number] Yes Take 1 tablet (500 mg total) by mouth every 6 (six) hours as needed.  Patient taking differently: Take 500 mg by mouth every 6 (six) hours as needed for mild pain (pain score 1-3).   Deatra Face, MD Taking Active Self  albuterol  (PROVENTIL ) (2.5 MG/3ML) 0.083% nebulizer solution [id number] Yes Take 3 mLs (2.5 mg total) by nebulization every 6 (six) hours as needed for wheezing or shortness of  breath. Guilloud, Carolyn, MD Taking Active Self  albuterol  (VENTOLIN  HFA) 108 (90 Base) MCG/ACT inhaler [id number] Yes TAKE 2 PUFFS BY MOUTH EVERY 6 HOURS AS NEEDED FOR WHEEZE OR SHORTNESS OF BREATH Olalere, Adewale A, MD Taking Active Self  amoxicillin -clavulanate (AUGMENTIN ) 875-125 MG tablet [id number] Yes Take 1 tablet by mouth 2 (two) times daily for 5 days. Vada Garibaldi, MD Taking Active   atorvastatin  (LIPITOR) 40 MG tablet [id number] Yes TAKE 1 TABLET BY MOUTH EVERY DAY Lasandra Points, MD Taking Active Self  Azelastine  HCl 137 MCG/SPRAY SOLN [id number] Yes PLACE 1 SPRAY INTO BOTH NOSTRILS 2 (TWO) TIMES DAILY. USE IN EACH NOSTRIL AS DIRECTED  Patient taking differently: Place 1 spray into both nostrils daily as needed (for allergies).   Lasandra Points, MD Taking Active Self           Med Note (SATTERFIELD, DARIUS E   Fri May 23, 2023  8:27 PM) Still has on hand   Blood Glucose Monitoring Suppl (ACCU-CHEK GUIDE ME) w/Device KIT [id number] Yes Dispense one device Lasandra Points, MD Taking Active Self  Brexpiprazole  (REXULTI ) 3 MG TABS [id number] Yes Take 1 tablet (3 mg total) by mouth daily. [name], Syed T, MD Taking Active Self  cetirizine  (ZYRTEC ) 10 MG tablet [id number] Yes Take 1 tablet (10 mg total) by mouth daily.  Patient taking differently: Take 10 mg by mouth every other day.  Guilloud, Carolyn, MD Taking Active Self  cyclobenzaprine  (FLEXERIL ) 10 MG tablet [id number] Yes Take 10 mg by mouth 3 (three) times daily. [provider] Taking Active Self  dicyclomine  (BENTYL ) 10 MG capsule [id number] Yes TAKE 2 CAPSULES (20 MG TOTAL) BY MOUTH 3 (THREE) TIMES DAILY AS NEEDED FOR SPASMS (ABDOMINAL PAIN). Edmonia Gottron, PA-C Taking Active Self  doxepin  (SINEQUAN ) 25 MG capsule [id number] Yes Take 1 capsule (25 mg total) by mouth at bedtime. Arturo Late, MD Taking Active Self  ENTRESTO  24-26 MG [id number] Yes TAKE 1 TABLET BY MOUTH TWICE A DAY Guilloud, Carolyn, MD Taking  Active Self  fluticasone  (FLONASE ) 50 MCG/ACT nasal spray [id number] Yes SPRAY 1 SPRAY INTO EACH NOSTRIL DAILY  Patient taking differently: Place 1 spray into both nostrils daily as needed for allergies.   Guilloud, Carolyn, MD Taking Active Self           Med Note (SATTERFIELD, Ken Patty   Fri May 23, 2023  8:23 PM) Still has on hand   guaiFENesin  (ROBITUSSIN) 100 MG/5ML liquid [id number] Yes Take 10 mLs by mouth every 6 (six) hours as needed for cough or to loosen phlegm. Vada Garibaldi, MD Taking Active   hydrocortisone  (ANUSOL -HC) 2.5 % rectal cream [id number] Yes Place 1 Application rectally daily as needed for hemorrhoids. [provider] Taking Active Self           Med Note (SATTERFIELD, DARIUS E   Fri May 23, 2023  8:23 PM) Still has on hand   ibuprofen  (ADVIL ) 800 MG tablet [id number] Yes Take 800 mg by mouth every 8 (eight) hours as needed for mild pain (pain score 1-3). [provider] Taking Active Self  ipratropium (ATROVENT ) 0.06 % nasal spray [id number] Yes Place 2 sprays into both nostrils daily as needed for rhinitis. [provider] Taking Active Self           Med Note (SATTERFIELD, DARIUS E   Fri May 23, 2023  8:25 PM) Still has on hand   ipratropium-albuterol  (DUONEB) 0.5-2.5 (3) MG/3ML SOLN [id number] Yes Take 3 mLs by nebulization 2 (two) times daily. Vada Garibaldi, MD Taking Active   lamoTRIgine  (LAMICTAL ) 100 MG tablet [id number] Yes Take 0.5 tablets (50 mg total) by mouth 2 (two) times daily. Arturo Late, MD Taking Active Self  LINZESS  290 MCG CAPS capsule [id number] Yes TAKE 1 CAPSULE BY MOUTH DAILY BEFORE BREAKFAST. Edmonia Gottron, PA-C Taking Active Self  meclizine  (ANTIVERT ) 25 MG tablet [id number] Yes Take 1 tablet (25 mg total) by mouth 3 (three) times daily as needed for dizziness.  Patient taking differently: Take 25 mg by mouth 3 (three) times daily.   Driscilla George, MD Taking Active Self           Med Note (SATTERFIELD, DARIUS E   Fri  May 23, 2023  8:37 PM) Patient states she is taking every day   megestrol  (MEGACE ) 40 MG tablet [id number] Yes Take 2 tablets (80 mg total) by mouth daily.  Patient taking differently: Take 80 mg by mouth daily as needed (appetite).   Granville Layer, MD Taking Active Self  metoprolol  tartrate (LOPRESSOR ) 25 MG tablet [id number] Yes Take 25 mg by mouth daily. [provider] Taking Active Self  omeprazole  (PRILOSEC) 40 MG capsule [id number] Yes TAKE 1 CAPSULE (40 MG TOTAL) BY MOUTH DAILY. Edmonia Gottron, PA-C Taking Active Self  ondansetron  (ZOFRAN -ODT) 8 MG disintegrating tablet [id number] Yes Take 1 tablet (8 mg total) by mouth every  8 (eight) hours as needed.  Patient taking differently: Take 8 mg by mouth every 8 (eight) hours as needed for nausea or vomiting.   Driscilla George, MD Taking Active Self  oxyCODONE  (OXY IR/ROXICODONE ) 5 MG immediate release tablet [id number] Yes Take 5 mg by mouth 2 (two) times daily. [provider] Taking Active Self           Med Note (SATTERFIELD, Lovella Rubin E   Fri May 23, 2023  8:35 PM) Patient verified she is taking every day   predniSONE  (DELTASONE ) 10 MG tablet [id number] Yes 3 tabs daily for 3 days 2 tabs daily for 3 days 1 tabs daily for 3 days Vada Garibaldi, MD Taking Active   promethazine  (PHENERGAN ) 25 MG tablet [id number] Yes Take 1 tablet (25 mg total) by mouth every 6 (six) hours as needed.  Patient taking differently: Take 25 mg by mouth every 6 (six) hours as needed for nausea or vomiting.   Edmonia Gottron, [place] Taking Active Self  Safety Seal Miscellaneous MISC [id number] Yes Apply 1 Application topically in the morning. Medication Name; AA Gel Dellar Fenton, DO Taking Active Self  Semaglutide , 2 MG/DOSE, (OZEMPIC , 2 MG/DOSE,) 8 MG/3ML SOPN [id number] Yes INJECT 2 MG INTO THE SKIN ONCE A WEEK. Guilloud, Carolyn, MD Taking Active Self           Med Note (SATTERFIELD, Ken Patty   Fri May 23, 2023  8:02 PM) Take on Fridays  SYMBICORT   160-4.5 MCG/ACT inhaler [id number] Yes Inhale 2 puffs into the lungs 2 (two) times daily. [provider] Taking Active Self  torsemide  (DEMADEX ) 20 MG tablet [id number] Yes TAKE 1 TABLET BY MOUTH TWICE A DAY Guilloud, Carolyn, MD Taking Active Self            Recommendation:   Specialty provider follow-up cardiology 5/5, she has call out to pulmonary office to have appointment sooner than 6/11 Referral to: community resource care guide for transportation resources Home Health requests: Physical therapy  Follow Up Plan:   Telephone follow up appointment date/time:  5/8 at 2pm Will collaborate with [employer] to obtain clinical information needed for new CPAP and renew oxygen .  Will collaborate with [facility] to send Mom's Meals Will send dental resources. Will reach out to MD to order diflucan  for complaints of yeast with antibiotic therapy.  Holland Lundborg, RN, MSN, CCM [facility], [facility] RN Care Coordinator Direct Dial: [phone] / Main [phone] Fax [phone] Email: [email or web address]: [email or web address]

## 2023-05-28 NOTE — Patient Instructions (Addendum)
 Visit Information  Thank you for taking time to visit with me today. Please don't hesitate to contact me if I can be of assistance to you before our next scheduled appointment.  Our next appointment is by telephone on 5/8 at 2pm  Please call the care guide team at [phone] if you need to cancel or reschedule your appointment.   See dental options: [facility] [place] [address] [place], [place] [address] [phone]   [facility] Department of Health - [facility] [address] [place], [place] [address] [phone]   [facility] Department of Health - [facility] [address], 2nd floor [place], [place] [address] [phone]   [facility] [address] [place], [place] [address] [phone]   [facility] [address] [facility] [phone]  Following is a copy of your care plan:   Goals Addressed             This Visit's Progress    VBCI RN Care Plan       Problems:  Chronic Disease Management support and education needs related to CHF, COPD, and Fall with broken toes Financial Constraints. Transportation barriers  Goal: Over the next 45 days the Patient will attend all scheduled medical appointments: Cardiology  5/5, mammogram and bone density on 5/8, pulmonary on 6/11 (call office back to get sooner appt) as evidenced by completed office visit in EMR        continue to work with RN Care Manager and/or Social Worker to address care management and care coordination needs related to CHF, COPD, and fall as evidenced by adherence to care management team scheduled appointments     experience decrease in ED visits as evidenced by electronic medical record review; ED visits in last 6 months = 1 not experience hospital admission as evidenced by review of electronic medical record. Hospital Admissions in last 6 months = 2 take all medications exactly as prescribed and will call  provider for medication related questions as evidenced by patient reported adherence    work with community resource care guide to address needs related to Transportation as evidenced by patient and/or community resource care guide support     Interventions:   Heart Failure Interventions: Provided education on low sodium diet Reviewed Heart Failure Action Plan in depth and provided written copy Assessed need for readable accurate scales in home Reviewed role of diuretics in prevention of fluid overload and management of heart failure; Screening for signs and symptoms of depression related to chronic disease state  Assessed social determinant of health barriers   COPD Interventions: Advised patient to track and manage COPD triggers Provided written and verbal instructions on pursed lip breathing and utilized returned demonstration as teach back Advised patient to self assesses COPD action plan zone and make appointment with provider if in the yellow zone for 48 hours without improvement Discussed the importance of adequate rest and management of fatigue with COPD   Falls Interventions: Provided written and verbal education re: potential causes of falls and Fall prevention strategies Assessed for falls since last encounter  Patient Self-Care Activities:  keep legs up while sitting use salt in moderation watch for swelling in feet, ankles and legs every day weigh myself daily identify and avoid work-related triggers identify and remove indoor air pollutants do breathing exercises every day develop a rescue plan  Plan:  The patient has been provided with contact information for the care management  team and has been advised to call with any health related questions or concerns.              Please call the Suicide and Crisis Lifeline: 988 call the [place]  National Suicide Prevention Lifeline: (431) 409-3562 or TTY: 276 068 2767 TTY (260) 716-7311) to talk to a trained  counselor call 1-800-273-TALK (toll free, 24 hour hotline) call 911 if you are experiencing a Mental Health or Behavioral Health Crisis or need someone to talk to.  Patient verbalizes understanding of instructions and care plan provided today and agrees to view in MyChart. Active MyChart status and patient understanding of how to access instructions and care plan via MyChart confirmed with patient.     Holland Lundborg, RN, MSN, CCM [facility], [facility] RN Care Coordinator Direct Dial: [phone] / Main [phone] Fax [phone] Email: [email or web address]: [email or web address]

## 2023-05-29 ENCOUNTER — Ambulatory Visit: Payer: Self-pay | Admitting: Pulmonary Disease

## 2023-05-29 NOTE — Telephone Encounter (Signed)
 Copied from CRM [id number]. Topic: Clinical - Red Word Triage >> May 29, 2023 12:46 PM Eveleen Hinds B wrote: [facility] that prompted transfer to Nurse Triage:  Diff breathing  TRIAGE SUMMARY NOTE: Pt reporting that she was discharged from hospital admission for COPD exacerbation on Sunday afternoon, been "getting out of breath again" since discharge, confirms was "not 100%" at discharge but feeling a bit worse, little more SOB than usual, "different from my normal," confirms breathing "sort of" faster than usual but no heart racing today. Pt speaking in full sentences, but reporting that she gets SOB when talking a while or with exertion. Pt confirms she has been taking steroid, antibx, and duoneb nebulizer as prescribed by hospital, not been using albuterol  inhaler/nebulizer, pt been on 3L oxygen  but confirms she has felt need to titrate up but not been told she could. Advised pt use her albuterol  every 6 hours as needed, advised pt be examined today, pt refusing due to lack of transportation today, advised pt continue meds as prescribed, sending HP message to pulm for call back with further recommendations and other appt options, advised call back or seek immediate care if worsening. Pt verbalized understanding. Pt also requesting new CPAP machine and needs an updated prescription letter for her oxygen  therapy. Please advise.  E2C2 Pulmonary Triage - Initial Assessment Questions "Chief Complaint (e.g., cough, sob, wheezing, fever, chills, sweat or additional symptoms) *Go to specific symptom protocol after initial questions. Discharged on Sunday afternoon Going go back toward that route, getting out of breath again, feeling worse at discharge though not 100% at discharge No chest pain, dizziness, weakness Just a little bit SOB, different from my normal SOB Steroids and antibx Duoneb nebulizer Need new CPAP machine, need updated script letter for oxygen  therapy Breathing faster sort of than usual No heart  racing today Tad bit when talk, out of breath when walking, some when leaning over to get something on bed Speaking in full sentences No transportation, Medicaid needs advanced notice  "How long have symptoms been present?" Even beginning of March, but last Tuesday went haywire, started worsening yesterday after hospital  "Have you used your inhalers/maintenance medication?" Yes If yes, "What medications?" Symbicort , duoneb 2x/day, not used albuterol  inhaler/nebulizer yet  OXYGEN : "Do you wear supplemental oxygen ?" Yes If yes, "How many liters are you supposed to use?" 3L, has felt the need to go up on oxygen  but unsure if able to go up  "Do you monitor your oxygen  levels?" No If yes, "What is your reading (oxygen  level) today?" Unable to take today, no batteries for pulse ox  "What is your usual oxygen  saturation reading?"  (Note: Pulmonary O2 sats should be 90% or greater) 80s to 95% before went to hospital, in hospital 90-something  Reason for Disposition  [1] MILD difficulty breathing (e.g., minimal/no SOB at rest, SOB with walking) AND [2] worse than when discharged from hospital  Answer Assessment - Initial Assessment Questions 3. BETTER-SAME-WORSE: "Are you getting better, staying the same, or getting worse compared to the day you were discharged?"     A little worse SOB 4. DISCHARGE DATE: "What date were you discharged from the hospital?"     Sunday afternoon 7. DISCHARGE MEDICINES: "Did the doctor who discharged you order any new medicines for you to use? If yes, have you filled the prescription and started taking the medicine?"      Prednisone , antibx, recommended tussin cough syrup but can't afford  Protocols used: COPD Post-Hospitalization Follow-up Call-A-AH

## 2023-05-29 NOTE — Telephone Encounter (Signed)
 Pt scheduled for ov with Dr Deanna Expose for Monday 06/02/23  She is asking for pred and anx to get her through until then  Dr. Deanna Expose, please see nurse triage note below and advise any recommendations, thanks!

## 2023-05-30 ENCOUNTER — Telehealth: Payer: Self-pay | Admitting: *Deleted

## 2023-05-30 ENCOUNTER — Telehealth: Payer: Self-pay

## 2023-05-30 NOTE — Patient Outreach (Signed)
 Calls placed to [facility] (to follow up on mom's meals) and [employer] (to inquire about updated order for O2 and new CPAP).  Per [facility] and patient, mom's meals has been approved and will be delivered.  Per [employer], patient will need new orders and face to face office notes faxed over.  For CPAP, they are unable to send someone to troubleshoot machine but cause it is now patient owned.  She will need an order for a brand new machine.  Noted per chart that patient has appointment with pulmonary on 5/5, will reach out to MD office after visit is complete to collaborate and make sure all needed information is sent for DME.  Holland Lundborg, RN, MSN, CCM [facility], [facility] RN Care Coordinator Direct Dial: [phone] / Main [phone] Fax [phone] Email: [email or web address]

## 2023-05-30 NOTE — Telephone Encounter (Signed)
 Called pt to see if they were using their cpap,said its broke needs to be seen for new order,was waiting on a  call from yesterday to be sent in for prednisone ,dr,o is out says her breathing is bad and she is shortness of breath and not feeling well and wants a prescription sent it today until her appointment.

## 2023-05-30 NOTE — Telephone Encounter (Signed)
 Pt is aware of below message/recommendations and voiced her understanding.  Nothing further needed.

## 2023-05-30 NOTE — Telephone Encounter (Signed)
 We have not seen her in 2 years. If she is having trouble breathing she needs to go to [facility] or ED for evaluation. Nothing we can do    Make sure she is using Symbicort  160 two puffs morning and evening. Also looks like she was prescribed prednisone  by Dr. Hilton Lucky on 05/26/23

## 2023-06-02 ENCOUNTER — Ambulatory Visit: Admitting: Pulmonary Disease

## 2023-06-02 ENCOUNTER — Encounter: Payer: Self-pay | Admitting: Pulmonary Disease

## 2023-06-02 VITALS — BP 103/71 | HR 108 | Temp 98.5°F | Ht 71.0 in | Wt 320.6 lb

## 2023-06-02 DIAGNOSIS — J440 Chronic obstructive pulmonary disease with acute lower respiratory infection: Secondary | ICD-10-CM

## 2023-06-02 DIAGNOSIS — I5032 Chronic diastolic (congestive) heart failure: Secondary | ICD-10-CM

## 2023-06-02 DIAGNOSIS — J9611 Chronic respiratory failure with hypoxia: Secondary | ICD-10-CM

## 2023-06-02 DIAGNOSIS — G4733 Obstructive sleep apnea (adult) (pediatric): Secondary | ICD-10-CM | POA: Diagnosis not present

## 2023-06-02 DIAGNOSIS — I2729 Other secondary pulmonary hypertension: Secondary | ICD-10-CM

## 2023-06-02 DIAGNOSIS — J441 Chronic obstructive pulmonary disease with (acute) exacerbation: Secondary | ICD-10-CM

## 2023-06-02 DIAGNOSIS — Z87891 Personal history of nicotine dependence: Secondary | ICD-10-CM

## 2023-06-02 MED ORDER — DOXYCYCLINE HYCLATE 100 MG PO TABS
100.0000 mg | ORAL_TABLET | Freq: Two times a day (BID) | ORAL | 0 refills | Status: DC
Start: 2023-06-02 — End: 2023-06-27

## 2023-06-02 MED ORDER — PREDNISONE 20 MG PO TABS: 20.0000 mg | ORAL_TABLET | Freq: Every day | ORAL | 0 refills | Status: DC

## 2023-06-02 NOTE — Progress Notes (Signed)
 Lindsay [name]    [id number]    1971/01/05  Primary Care Physician:Osei-Bonsu, Caretha Chapel, MD  Referring Physician: Tretha Fu, MD [address] [place],  [place] [address]  Chief complaint:   Ongoing shortness of breath, cough, Recently hospitalized for respiratory failure  HPI:  Ongoing shortness of breath for Cough with minimal sputum production still yellow-brown sputum Shortness of breath with minimal activity  Desaturates pretty quickly  Hospitalized for shortness of breath  History of diastolic heart failure, obstructive sleep apnea, obstructive lung disease, chronic respiratory failure  Quit smoking in 2021  Chronically on oxygen  at about 3 L History of PTSD, depression  History of sleep apnea initially diagnosed in 2016  Outpatient Encounter Medications as of 06/02/2023  Medication Sig   ACCU-CHEK GUIDE test strip USE ONE TEST STRIP TO CHECK BLOOD SUGARS ONCE A DAY AS NEEDED   Accu-Chek Softclix Lancets lancets USE ONE LANCET TO CHECK BLOOD SUGAR ONE A DAY AS NEEDED   acetaminophen  (TYLENOL ) 500 MG tablet Take 1 tablet (500 mg total) by mouth every 6 (six) hours as needed. (Patient taking differently: Take 500 mg by mouth every 6 (six) hours as needed for mild pain (pain score 1-3).)   albuterol  (PROVENTIL ) (2.5 MG/3ML) 0.083% nebulizer solution Take 3 mLs (2.5 mg total) by nebulization every 6 (six) hours as needed for wheezing or shortness of breath.   albuterol  (VENTOLIN  HFA) 108 (90 Base) MCG/ACT inhaler TAKE 2 PUFFS BY MOUTH EVERY 6 HOURS AS NEEDED FOR WHEEZE OR SHORTNESS OF BREATH   atorvastatin  (LIPITOR) 40 MG tablet TAKE 1 TABLET BY MOUTH EVERY DAY   Azelastine  HCl 137 MCG/SPRAY SOLN PLACE 1 SPRAY INTO BOTH NOSTRILS 2 (TWO) TIMES DAILY. USE IN EACH NOSTRIL AS DIRECTED (Patient taking differently: Place 1 spray into both nostrils daily as needed (for allergies).)   Blood Glucose Monitoring Suppl (ACCU-CHEK GUIDE ME) w/Device KIT  Dispense one device   Brexpiprazole  (REXULTI ) 3 MG TABS Take 1 tablet (3 mg total) by mouth daily.   cetirizine  (ZYRTEC ) 10 MG tablet Take 1 tablet (10 mg total) by mouth daily. (Patient taking differently: Take 10 mg by mouth every other day.)   cyclobenzaprine  (FLEXERIL ) 10 MG tablet Take 10 mg by mouth 3 (three) times daily.   dicyclomine  (BENTYL ) 10 MG capsule TAKE 2 CAPSULES (20 MG TOTAL) BY MOUTH 3 (THREE) TIMES DAILY AS NEEDED FOR SPASMS (ABDOMINAL PAIN).   doxepin  (SINEQUAN ) 25 MG capsule Take 1 capsule (25 mg total) by mouth at bedtime.   doxycycline  (VIBRA -TABS) 100 MG tablet Take 1 tablet (100 mg total) by mouth 2 (two) times daily.   ENTRESTO  24-26 MG TAKE 1 TABLET BY MOUTH TWICE A DAY   fluticasone  (FLONASE ) 50 MCG/ACT nasal spray SPRAY 1 SPRAY INTO EACH NOSTRIL DAILY (Patient taking differently: Place 1 spray into both nostrils daily as needed for allergies.)   guaiFENesin  (ROBITUSSIN) 100 MG/5ML liquid Take 10 mLs by mouth every 6 (six) hours as needed for cough or to loosen phlegm.   hydrocortisone  (ANUSOL -HC) 2.5 % rectal cream Place 1 Application rectally daily as needed for hemorrhoids.   ibuprofen  (ADVIL ) 800 MG tablet Take 800 mg by mouth every 8 (eight) hours as needed for mild pain (pain score 1-3).   ipratropium (ATROVENT ) 0.06 % nasal spray Place 2 sprays into both nostrils daily as needed for rhinitis.   ipratropium-albuterol  (DUONEB) 0.5-2.5 (3) MG/3ML SOLN Take 3 mLs by nebulization 2 (two) times daily.  lamoTRIgine  (LAMICTAL ) 100 MG tablet Take 0.5 tablets (50 mg total) by mouth 2 (two) times daily.   LINZESS  290 MCG CAPS capsule TAKE 1 CAPSULE BY MOUTH DAILY BEFORE BREAKFAST.   megestrol  (MEGACE ) 40 MG tablet Take 2 tablets (80 mg total) by mouth daily. (Patient taking differently: Take 80 mg by mouth daily as needed (appetite).)   metoprolol  tartrate (LOPRESSOR ) 25 MG tablet Take 25 mg by mouth daily.   omeprazole  (PRILOSEC) 40 MG capsule TAKE 1 CAPSULE (40 MG TOTAL)  BY MOUTH DAILY.   ondansetron  (ZOFRAN -ODT) 8 MG disintegrating tablet Take 1 tablet (8 mg total) by mouth every 8 (eight) hours as needed. (Patient taking differently: Take 8 mg by mouth every 8 (eight) hours as needed for nausea or vomiting.)   oxyCODONE  (OXY IR/ROXICODONE ) 5 MG immediate release tablet Take 5 mg by mouth 2 (two) times daily.   predniSONE  (DELTASONE ) 10 MG tablet 3 tabs daily for 3 days 2 tabs daily for 3 days 1 tabs daily for 3 days   predniSONE  (DELTASONE ) 20 MG tablet Take 1 tablet (20 mg total) by mouth daily with breakfast.   promethazine  (PHENERGAN ) 25 MG tablet Take 1 tablet (25 mg total) by mouth every 6 (six) hours as needed. (Patient taking differently: Take 25 mg by mouth every 6 (six) hours as needed for nausea or vomiting.)   Safety Seal Miscellaneous MISC Apply 1 Application topically in the morning. Medication Name; AA Gel   Semaglutide , 2 MG/DOSE, (OZEMPIC , 2 MG/DOSE,) 8 MG/3ML SOPN INJECT 2 MG INTO THE SKIN ONCE A WEEK.   SYMBICORT  160-4.5 MCG/ACT inhaler Inhale 2 puffs into the lungs 2 (two) times daily.   torsemide  (DEMADEX ) 20 MG tablet TAKE 1 TABLET BY MOUTH TWICE A DAY   No facility-administered encounter medications on file as of 06/02/2023.    Allergies as of 06/02/2023 - Review Complete 06/02/2023  Allergen Reaction Noted   Lisinopril  Rash and Cough 11/06/2015    Past Medical History:  Diagnosis Date   Acid reflux    Anemia    Iron  Def   Anorexia    CHF (congestive heart failure) (HCC)    Chronic kidney disease    Nephrotic syndrome   Colon polyp 2009   Depression with anxiety    Edema leg    Fibroid tumor 04/2022   Fibromyalgia    Hemorrhoids    Hidradenitis suppurativa    Hypertension    IBS (irritable bowel syndrome)    Low back pain    Migraines    Morbidly obese (HCC)    Neuromuscular disorder (HCC)    fibromyalgia   Neuropathy    Panic attacks    Polyp of vocal cord or larynx    Stroke (HCC)    Tonsil pain     Past  Surgical History:  Procedure Laterality Date   AXILLARY HIDRADENITIS EXCISION     COLONOSCOPY WITH PROPOFOL  N/A 05/25/2015   Procedure: COLONOSCOPY WITH PROPOFOL ;  Surgeon: Janel Medford, MD;  Location: [facility];  Service: Endoscopy;  Laterality: N/A;   COLONOSCOPY WITH PROPOFOL  N/A 12/31/2018   Procedure: COLONOSCOPY WITH PROPOFOL ;  Surgeon: Janel Medford, MD;  Location: [facility];  Service: Endoscopy;  Laterality: N/A;   HEMORRHOID SURGERY     with Hidradenitis surgery    INGUINAL HIDRADENITIS EXCISION     TONSILLECTOMY  10/18/2010   by Dr. Melissa Spring   UPPER GASTROINTESTINAL ENDOSCOPY      Family History  Problem Relation Age of Onset  Diabetes Mother    Heart disease Mother        valve leak   Anxiety disorder Mother    Depression Mother    High blood pressure Mother    Kidney failure Mother    Cancer Father        prostate   Heart disease Father    Learning disabilities Sister    Depression Sister    Depression Sister    Anxiety disorder Sister    Colon cancer Neg Hx     Social History   Socioeconomic History   Marital status: Single    Spouse name: Not on file   Number of children: 1   Years of education: 11   Highest education level: Not on file  Occupational History   Occupation: Disabled  Tobacco Use   Smoking status: Former    Current packs/day: 0.00    Average packs/day: 0.1 packs/day for 25.0 years (2.5 ttl pk-yrs)    Types: Cigarettes    Start date: 05/24/1994    Quit date: 05/24/2019    Years since quitting: 4.0   Smokeless tobacco: Never   Tobacco comments:    quit in April.  Vaping Use   Vaping status: Never Used  Substance and Sexual Activity   Alcohol use: Not Currently    Alcohol/week: 0.0 standard drinks of alcohol    Comment: none sine 05/2018   Drug use: Not Currently    Frequency: 3.0 times per week    Types: Marijuana   Sexual activity: Yes    Birth control/protection: Implant    Comment: placed in March 2024  Other  Topics Concern   Not on file  Social History Narrative   Lives at home w/ her mother and daughter   Right-handed   Caffeine: about 3 Cokes per week   Social Drivers of Health   Financial Resource Strain: Medium Risk (05/27/2023)   Received from [facility]   Overall Financial Resource Strain (CARDIA)    Difficulty of Paying Living Expenses: Somewhat hard  Food Insecurity: Food Insecurity Present (05/28/2023)   Hunger Vital Sign    Worried About Running Out of Food in the Last Year: Often true    Ran Out of Food in the Last Year: Often true  Transportation Needs: Unmet Transportation Needs (05/28/2023)   PRAPARE - Administrator, Civil Service (Medical): Yes    Lack of Transportation (Non-Medical): Yes  Physical Activity: Unknown (05/27/2023)   [facility] from [facility]   Exercise Vital Sign    Days of Exercise per Week: 0 days    Minutes of Exercise per Session: Not on file  Stress: Stress Concern Present (05/27/2023)   Received from [facility] - Occupational Stress Questionnaire    Feeling of Stress : To some extent  Social Connections: Socially Isolated (05/27/2023)   [facility]   Social Network    How would you rate your social network (family, work, friends)?: Little participation, lonely and socially isolated  Intimate Partner Violence: Not At Risk (05/28/2023)   Humiliation, Afraid, Rape, and Kick questionnaire    Fear of Current or Ex-Partner: No    Emotionally Abused: No    Physically Abused: No    Sexually Abused: No    Review of Systems  Constitutional:  Positive for fatigue.  Respiratory:  Positive for apnea and shortness of breath.   Musculoskeletal:  Positive for arthralgias.  Psychiatric/Behavioral:  Positive for sleep  disturbance.     Vitals:   06/02/23 0835  BP: 103/71  Pulse: (!) 108  Temp: 98.5 F (36.9 C)  SpO2: 98%     Physical Exam Constitutional:      Appearance:  She is obese.  HENT:     Head: Normocephalic.     Mouth/Throat:     Mouth: Mucous membranes are moist.  Eyes:     General: No scleral icterus. Cardiovascular:     Rate and Rhythm: Normal rate and regular rhythm.     Heart sounds: No murmur heard.    No friction rub.  Pulmonary:     Effort: No respiratory distress.     Breath sounds: No stridor. No wheezing or rhonchi.  Musculoskeletal:     Cervical back: No rigidity or tenderness.  Neurological:     Mental Status: She is alert.  Psychiatric:        Mood and Affect: Mood normal.    Data Reviewed: Recent hospital records reviewed  Recent CT reviewed showing enlarged pulmonary arteries  Previous sleep study reviewed  Download from CPAP machine not available during this visit  Echocardiogram with diastolic dysfunction, right atrial pressure of about 15  Assessment:  Chronic respiratory failure - Continue oxygen  supplementation - Attempted to requalify for oxygen  today She was having so much pain could not walk enough to ascertain desaturations  COPD exacerbation/acute bronchitis - Prescription for doxycycline  and prednisone  sent into pharmacy  Concern for pulmonary hypertension, - Likely multifactorial including heart disease and chronic hypoxemia, obesity hypoventilation - Has appointment with cardiology - Will benefit from right heart catheterization  Chronic obstructive pulmonary disease - Continue bronchodilators  Obstructive sleep apnea - Will place order for new CPAP  Plan/Recommendations: Placed order for new CPAP auto CPAP 5-18 with heated medication with AirFit P10 medium size mask  Continue oxygen  supplementation  Continue Symbicort  inhaler  Prescription for antibiotic sent to pharmacy  Strongly encouraged to make sure she follows up with cardiology, she does have an appointment this week - Needs further evaluation for pulmonary hypertension  She will need a qualifying walk for oxygen -when able  to  Consider pulmonary function test at the next visit  Follow-up will be scheduled for about 6 to 8 weeks  Encouraged to call with significant concerns     Myer Artis MD [facility] 06/02/2023, 8:57 AM  CC: Tretha Fu, MD

## 2023-06-02 NOTE — Patient Instructions (Addendum)
 Prescription for doxycycline  100 p.o. twice daily for 10 days Prescription for prednisone  20 for 7 days  Continue using your Symbicort  inhaler  Make sure you keep your appointment with the heart doctor - Your CT scan shows pulmonary hypertension - You will need a right heart catheterization to figure out the pressures  We will place a prescription for a new CPAP  We will provided prescription for continuing oxygen   I will see you back in about 6 to 8 weeks  It is important to have the workup for the pulmonary hypertension before you undergo endoscopy  We will placed an order to [facility] for a new CPAP auto CPAP 5-18 with heated humidification with mask of choice-AirFit P10 medium size

## 2023-06-03 ENCOUNTER — Telehealth: Payer: Self-pay | Admitting: *Deleted

## 2023-06-03 NOTE — Patient Outreach (Signed)
 Call placed to [employer] to follow up on CPAP order per pulmonary visit yesterday.  Per [employer], they have not received orders as of yet.  Confirmed correct fax number is [phone], will collaborate with pulmonology office to ensure orders for CPAP and renewal of oxygen  are faxed.   Holland Lundborg, RN, MSN, CCM [facility], [facility] RN Care Coordinator Direct Dial: [phone] / Main [phone] Fax [phone] Email: [email or web address]: [email or web address]

## 2023-06-04 DIAGNOSIS — E782 Mixed hyperlipidemia: Secondary | ICD-10-CM | POA: Diagnosis not present

## 2023-06-04 DIAGNOSIS — I5042 Chronic combined systolic (congestive) and diastolic (congestive) heart failure: Secondary | ICD-10-CM | POA: Diagnosis not present

## 2023-06-04 DIAGNOSIS — I10 Essential (primary) hypertension: Secondary | ICD-10-CM | POA: Diagnosis not present

## 2023-06-04 DIAGNOSIS — R0600 Dyspnea, unspecified: Secondary | ICD-10-CM | POA: Diagnosis not present

## 2023-06-05 ENCOUNTER — Other Ambulatory Visit (HOSPITAL_COMMUNITY): Payer: Self-pay

## 2023-06-05 ENCOUNTER — Telehealth: Payer: Self-pay

## 2023-06-05 ENCOUNTER — Other Ambulatory Visit: Payer: Self-pay | Admitting: *Deleted

## 2023-06-05 DIAGNOSIS — F331 Major depressive disorder, recurrent, moderate: Secondary | ICD-10-CM

## 2023-06-05 DIAGNOSIS — F4312 Post-traumatic stress disorder, chronic: Secondary | ICD-10-CM

## 2023-06-05 MED ORDER — LAMOTRIGINE 100 MG PO TABS: 50.0000 mg | ORAL_TABLET | Freq: Two times a day (BID) | ORAL | 0 refills | Status: DC

## 2023-06-05 MED ORDER — DOXEPIN HCL 25 MG PO CAPS
25.0000 mg | ORAL_CAPSULE | Freq: Every day | ORAL | 0 refills | Status: DC
Start: 2023-06-05 — End: 2023-06-25

## 2023-06-05 NOTE — Progress Notes (Signed)
   Telephone encounter was:  Successful.  Complex Care Management Note Care Guide Note  06/05/2023 Name: Lindsay [name] MRN: [id number] DOB: [birth year]  Lindsay [name] is a 53 y.o. year old female who is a primary care patient of Osei-Bonsu, Caretha Chapel, MD . The community resource team was consulted for assistance with Transportation Needs  and Food Insecurity  SDOH screenings and interventions completed:  Yes  Social Drivers of Health From This Encounter   Food Insecurity: Food Insecurity Present (06/05/2023)   Hunger Vital Sign    Worried About Running Out of Food in the Last Year: Often true    Ran Out of Food in the Last Year: Often true  Financial Resource Strain: High Risk (06/05/2023)   Overall Financial Resource Strain (CARDIA)    Difficulty of Paying Living Expenses: Very hard  Transportation Needs: Unmet Transportation Needs (06/05/2023)   PRAPARE - Administrator, Civil Service (Medical): Yes    Lack of Transportation (Non-Medical): Yes    SDOH Interventions Today    Flowsheet Row Most Recent Value  SDOH Interventions   Food Insecurity Interventions Community Resources Provided, [employer] Referral  Transportation Interventions Community Resources Provided, [id number] Referral  Financial Strain Interventions Community Resources Provided, [facility] Referral        Care guide performed the following interventions: Patient provided with information about care guide support team and interviewed to confirm resource needs.Patient is having financial strain and needs assistance with food and transportation  I have emailed, mailed, and added referrals to [facility] Follow Up Plan:  No further follow up planned at this time. The patient has been provided with needed resources.  Encounter Outcome:  Patient Visit Completed   Azell Leopard [facility]  [facility] Guide, Phone: [phone] Fax: [phone] Website: [email or web address]

## 2023-06-05 NOTE — Patient Outreach (Signed)
 Complex Care Management   Visit Note  06/05/2023  Name:  Lindsay [name] MRN: [id number] DOB: 1970-09-23  Situation: Referral received for Complex Care Management related to Heart Failure, COPD, and pulmonary hypertension I obtained verbal consent from Patient.  Visit completed with patient  on the phone  Patient continues to have some shortness of breath, seen by pulmonology and cardiology this week.  Will plan to have heart cath after follow up cardiology appointment next week.   Background:   Past Medical History:  Diagnosis Date   Acid reflux    Anemia    Iron  Def   Anorexia    CHF (congestive heart failure) (HCC)    Chronic kidney disease    Nephrotic syndrome   Colon polyp 2009   Depression with anxiety    Edema leg    Fibroid tumor 04/2022   Fibromyalgia    Hemorrhoids    Hidradenitis suppurativa    Hypertension    IBS (irritable bowel syndrome)    Low back pain    Migraines    Morbidly obese (HCC)    Neuromuscular disorder (HCC)    fibromyalgia   Neuropathy    Panic attacks    Polyp of vocal cord or larynx    Stroke (HCC)    Tonsil pain     Assessment: Patient Reported Symptoms:  Cognitive Cognitive Status: Alert and oriented to person, place, and time, Able to follow simple commands, Normal speech and language skills Cognitive/Intellectual Conditions Management [RPT]: None reported or documented in medical history or problem list      Neurological Neurological Review of Symptoms: No symptoms reported    HEENT HEENT Symptoms Reported: No symptoms reported      Cardiovascular Cardiovascular Symptoms Reported: Palpitations Cardiovascular Conditions: Heart failure (Report she was told she has pulmonary hypertension and will need heart cath) Cardiovascular Management Strategies: Diet modification, Fluid modification, Medication therapy Cardiovascular Self-Management Outcome: 3 (uncertain)  Respiratory Respiratory Symptoms Reported: Shortness of  breath Additional Respiratory Details: Was seen by pulmonology on 5/6, prescribed antibiotics and steroids Respiratory Conditions: COPD, Shortness of breath  Endocrine Patient reports the following symptoms related to hypoglycemia or hyperglycemia : No symptoms reported    Gastrointestinal Gastrointestinal Symptoms Reported: No symptoms reported      Genitourinary Genitourinary Symptoms Reported: No symptoms reported    Integumentary Integumentary Symptoms Reported: No symptoms reported    Musculoskeletal Musculoskelatal Symptoms Reviewed: No symptoms reported        Psychosocial Psychosocial Symptoms Reported: No symptoms reported            05/28/2023    1:32 PM  Depression screen PHQ 2/9  Decreased Interest 1  Down, Depressed, Hopeless 1  PHQ - 2 Score 2  Altered sleeping 2  Tired, decreased energy 2  Change in appetite 1  Feeling bad or failure about yourself  0  Trouble concentrating 1  Moving slowly or fidgety/restless 1  Suicidal thoughts 0  PHQ-9 Score 9  Difficult doing work/chores Somewhat difficult    There were no vitals filed for this visit.  Medications Reviewed Today     Reviewed by Holland Lundborg, RN (Registered Nurse) on 06/05/23 at 1441  Med List Status: <None>   Medication Order Taking? Sig Documenting Provider Last Dose Status Informant  ACCU-CHEK GUIDE test strip [id number] No USE ONE TEST STRIP TO CHECK BLOOD SUGARS ONCE A DAY AS NEEDED Lasandra Points, MD Taking Active Self  Accu-Chek Softclix Lancets lancets [id number] No USE ONE LANCET TO CHECK BLOOD  SUGAR ONE A DAY AS NEEDED Lasandra Points, MD Taking Active Self  acetaminophen  (TYLENOL ) 500 MG tablet [id number] No Take 1 tablet (500 mg total) by mouth every 6 (six) hours as needed.  Patient taking differently: Take 500 mg by mouth every 6 (six) hours as needed for mild pain (pain score 1-3).   Deatra Face, MD Taking Active Self  albuterol  (PROVENTIL ) (2.5 MG/3ML) 0.083% nebulizer  solution [id number] No Take 3 mLs (2.5 mg total) by nebulization every 6 (six) hours as needed for wheezing or shortness of breath. Guilloud, Carolyn, MD Taking Active Self  albuterol  (VENTOLIN  HFA) 108 [phone]) MCG/ACT inhaler [id number] No TAKE 2 PUFFS BY MOUTH EVERY 6 HOURS AS NEEDED FOR WHEEZE OR SHORTNESS OF BREATH Olalere, Adewale A, MD Taking Active Self  atorvastatin  (LIPITOR) 40 MG tablet [id number] No TAKE 1 TABLET BY MOUTH EVERY DAY Lasandra Points, MD Taking Active Self  Azelastine  HCl 137 MCG/SPRAY SOLN [id number] No PLACE 1 SPRAY INTO BOTH NOSTRILS 2 (TWO) TIMES DAILY. USE IN EACH NOSTRIL AS DIRECTED  Patient taking differently: Place 1 spray into both nostrils daily as needed (for allergies).   Lasandra Points, MD Taking Active Self           Med Note (SATTERFIELD, DARIUS E   Fri May 23, 2023  8:27 PM) Still has on hand   Blood Glucose Monitoring Suppl (ACCU-CHEK GUIDE ME) w/Device KIT [id number] No Dispense one device Lasandra Points, MD Taking Active Self  Brexpiprazole  (REXULTI ) 3 MG TABS [id number] No Take 1 tablet (3 mg total) by mouth daily. Arturo Late, MD Taking Active Self  cetirizine  (ZYRTEC ) 10 MG tablet [id number] No Take 1 tablet (10 mg total) by mouth daily.  Patient taking differently: Take 10 mg by mouth every other day.   Guilloud, Carolyn, MD Taking Active Self  cyclobenzaprine  (FLEXERIL ) 10 MG tablet [id number] No Take 10 mg by mouth 3 (three) times daily. [provider] Taking Active Self  dicyclomine  (BENTYL ) 10 MG capsule [id number] No TAKE 2 CAPSULES (20 MG TOTAL) BY MOUTH 3 (THREE) TIMES DAILY AS NEEDED FOR SPASMS (ABDOMINAL PAIN). Edmonia Gottron, PA-C Taking Active Self  doxepin  (SINEQUAN ) 25 MG capsule [id number]  Take 1 capsule (25 mg total) by mouth at bedtime. [name], Syed T, MD  Active   doxycycline  (VIBRA -TABS) 100 MG tablet [id number]  Take 1 tablet (100 mg total) by mouth 2 (two) times daily. Margaretann Sharper, MD  Active   ENTRESTO  24-26  MG [id number] No TAKE 1 TABLET BY MOUTH TWICE A DAY Guilloud, Carolyn, MD Taking Active Self  fluticasone  (FLONASE ) 50 MCG/ACT nasal spray [id number] No SPRAY 1 SPRAY INTO EACH NOSTRIL DAILY  Patient taking differently: Place 1 spray into both nostrils daily as needed for allergies.   Guilloud, Carolyn, MD Taking Active Self           Med Note (SATTERFIELD, Ken Patty   Fri May 23, 2023  8:23 PM) Still has on hand   guaiFENesin  (ROBITUSSIN) 100 MG/5ML liquid [id number] No Take 10 mLs by mouth every 6 (six) hours as needed for cough or to loosen phlegm. Vada Garibaldi, MD Taking Active   hydrocortisone  (ANUSOL -HC) 2.5 % rectal cream [id number] No Place 1 Application rectally daily as needed for hemorrhoids. [provider] Taking Active Self           Med Note (SATTERFIELD, DARIUS E   Fri May 23, 2023  8:23 PM) Still has on hand   ibuprofen  (ADVIL ) 800 MG tablet  [id number] No Take 800 mg by mouth every 8 (eight) hours as needed for mild pain (pain score 1-3). [provider] Taking Active Self  ipratropium (ATROVENT ) 0.06 % nasal spray [id number] No Place 2 sprays into both nostrils daily as needed for rhinitis. [provider] Taking Active Self           Med Note (SATTERFIELD, DARIUS E   Fri May 23, 2023  8:25 PM) Still has on hand   ipratropium-albuterol  (DUONEB) 0.5-2.5 (3) MG/3ML SOLN [id number] No Take 3 mLs by nebulization 2 (two) times daily. Vada Garibaldi, MD Taking Active   lamoTRIgine  (LAMICTAL ) 100 MG tablet [id number]  Take 0.5 tablets (50 mg total) by mouth 2 (two) times daily. Arturo Late, MD  Active   LINZESS  290 MCG CAPS capsule [id number] No TAKE 1 CAPSULE BY MOUTH DAILY BEFORE BREAKFAST. Edmonia Gottron, PA-C Taking Active Self  megestrol  (MEGACE ) 40 MG tablet [id number] No Take 2 tablets (80 mg total) by mouth daily.  Patient taking differently: Take 80 mg by mouth daily as needed (appetite).   Granville Layer, MD Taking Active Self  metoprolol  tartrate  (LOPRESSOR ) 25 MG tablet [id number] No Take 25 mg by mouth daily. [provider] Taking Active Self  omeprazole  (PRILOSEC) 40 MG capsule [id number] No TAKE 1 CAPSULE (40 MG TOTAL) BY MOUTH DAILY. Edmonia Gottron, PA-C Taking Active Self  ondansetron  (ZOFRAN -ODT) 8 MG disintegrating tablet [id number] No Take 1 tablet (8 mg total) by mouth every 8 (eight) hours as needed.  Patient taking differently: Take 8 mg by mouth every 8 (eight) hours as needed for nausea or vomiting.   Driscilla George, MD Taking Active Self  oxyCODONE  (OXY IR/ROXICODONE ) 5 MG immediate release tablet [id number] No Take 5 mg by mouth 2 (two) times daily. [provider] Taking Active Self           Med Note (SATTERFIELD, Lovella Rubin E   Fri May 23, 2023  8:35 PM) Patient verified she is taking every day   predniSONE  (DELTASONE ) 10 MG tablet [id number] No 3 tabs daily for 3 days 2 tabs daily for 3 days 1 tabs daily for 3 days Vada Garibaldi, MD Taking Active   predniSONE  (DELTASONE ) 20 MG tablet [id number]  Take 1 tablet (20 mg total) by mouth daily with breakfast. Myer Artis A, MD  Active   promethazine  (PHENERGAN ) 25 MG tablet [id number] No Take 1 tablet (25 mg total) by mouth every 6 (six) hours as needed.  Patient taking differently: Take 25 mg by mouth every 6 (six) hours as needed for nausea or vomiting.   Edmonia Gottron, [place] Taking Active Self  Safety Seal Miscellaneous MISC [id number] No Apply 1 Application topically in the morning. Medication Name; AA Gel Dellar Fenton, DO Taking Active Self  Semaglutide , 2 MG/DOSE, (OZEMPIC , 2 MG/DOSE,) 8 MG/3ML SOPN [id number] No INJECT 2 MG INTO THE SKIN ONCE A WEEK. Guilloud, Carolyn, MD Taking Active Self           Med Note (SATTERFIELD, Ken Patty   Fri May 23, 2023  8:02 PM) Take on Fridays  SYMBICORT  160-4.5 MCG/ACT inhaler [id number] No Inhale 2 puffs into the lungs 2 (two) times daily. [provider] Taking Active Self  torsemide  (DEMADEX ) 20 MG  tablet [id number] No TAKE 1 TABLET BY MOUTH TWICE A DAY Guilloud, Carolyn, MD Taking Active Self            Recommendation:   PCP Follow-up Specialty provider follow-up cardiology  on 5/12 to address pulmonary hypertension and need for heart cath  Follow Up Plan:   Telephone follow up appointment date/time:  5/15 at 11am Will advised pulmonology office that [employer] has not received orders for CPAP/O2 and will provide fax number.    Holland Lundborg, RN, MSN, CCM [facility], [facility] Health RN Care Coordinator Direct Dial: [phone] / Main [phone] Fax [phone] Email: [email or web address] Website: [email or web address]

## 2023-06-05 NOTE — Patient Instructions (Signed)
 Visit Information  Thank you for taking time to visit with me today. Please don't hesitate to contact me if I can be of assistance to you before our next scheduled appointment.  Your next care management appointment is by telephone on 5/15 at 11am  Please call the care guide team at [phone] if you need to cancel, schedule, or reschedule an appointment.   Please call the Suicide and Crisis Lifeline: 988 call the [place]  National Suicide Prevention Lifeline: 336-541-4649 or TTY: 548-741-7463 TTY 2188833305) to talk to a trained counselor call 1-800-273-TALK (toll free, 24 hour hotline) call 911 if you are experiencing a Mental Health or Behavioral Health Crisis or need someone to talk to.  Holland Lundborg, RN, MSN, CCM [facility], [facility] Health RN Care Coordinator Direct Dial: [phone] / Main [phone] Fax [phone] Email: [email or web address] Website: [email or web address]

## 2023-06-06 ENCOUNTER — Ambulatory Visit (INDEPENDENT_AMBULATORY_CARE_PROVIDER_SITE_OTHER): Admitting: Licensed Clinical Social Worker

## 2023-06-06 DIAGNOSIS — F4312 Post-traumatic stress disorder, chronic: Secondary | ICD-10-CM

## 2023-06-06 DIAGNOSIS — F331 Major depressive disorder, recurrent, moderate: Secondary | ICD-10-CM | POA: Diagnosis not present

## 2023-06-06 DIAGNOSIS — F411 Generalized anxiety disorder: Secondary | ICD-10-CM | POA: Diagnosis not present

## 2023-06-06 DIAGNOSIS — F410 Panic disorder [episodic paroxysmal anxiety] without agoraphobia: Secondary | ICD-10-CM

## 2023-06-06 NOTE — Progress Notes (Signed)
 Virtual Visit via Video Note  I connected with Jahliyah Taboada on 06/06/23 at  8:00 AM EDT by a video enabled telemedicine application and verified that I am speaking with the correct person using two identifiers.  Location: Patient: home Provider: home office   I discussed the limitations of evaluation and management by telemedicine and the availability of in person appointments. The patient expressed understanding and agreed to proceed.   I discussed the assessment and treatment plan with the patient. The patient was provided an opportunity to ask questions and all were answered. The patient agreed with the plan and demonstrated an understanding of the instructions.   The patient was advised to call back or seek an in-person evaluation if the symptoms worsen or if the condition fails to improve as anticipated.  I provided 40 minutes of non-face-to-face time during this encounter.  THERAPIST PROGRESS NOTE  Session Time: 8:00 AM to 8:40 AM  Participation Level: Active  Behavioral Response: CasualAlertAnxious  Type of Therapy: Individual Therapy  Treatment Goals addressed: patient continue to utilize therapy for  supportive and strength-based interventions, provide treatment interventions in the context of patient continuing to seek help and cope with medical issues, anxiety, triggers, coping with mental health symptoms, trauma, utilize therapy as a way for patient to focus on working through current stressors that also helps to distract from pain  ProgressTowards Goals: Progressing-therapist assesses important therapeutic interventions with patient at this point with significant stressors by able to have strength-based supportive interventions also processing thoughts and feelings in session for coping  Interventions: Solution Focused, Strength-based, Supportive, and Other: Coping  Summary: Raichel Cooler is a 53 y.o. female who presents with updating therapist the heart doctor says  need to put a catheter in heart arteries bigger than supposed to be- pulmonary hypertension. Has to get this done first not sure about the GI surgery. Not going to clear her at this point with heart issues still having a little bit of issues with the breathing. The other day really bad why went to the heart doctor because heart racing more. The doctor said don't use inhaler increase heart racing, plan to get C-pap new prescription for oxygen  from the pulmonary doctor. Two fractures in back, tendons in left knee, broken toes steroid not working go in and clear that out. Orthopedic surgeons with [facility]. Pain medications are going to start today. OxyContin . Therapist asked patient how she feels she says all this is scary. Decent doctors doesn't take away from the scariness. Heart doctor doesn't know how much change but telling her to get it done. All of is overwhelming seeing all these doctors pulmonary, heart doctor, orthopedic doctor. What we hope? Catheter is going to help.  Keeps all this to herself why therapy helpful. Hard to distract. Can't not focus on it. Shared her worries of risk with catheter and being put under. Therapist noted ask the questions. Therapist glad get the pain meds today patient agrees take away the pain so it is tolerable. Hope helps feel better.  Therapist noted may help with anxiety.  Therapist encouraged patient with spirituality therapist notes she uses that is one of her main coping strategies.  Processed thoughts and feelings in session of note therapist important support for patient helping her manage the significant stressors.     Suicidal/Homicidal: No  Plan: Return again in 4 weeks.(On waiting list).2 processed thoughts and feelings in session to help patient cope with stressors provide supportive strength-based intervention  Diagnosis: Major depressive disorder,  recurrent, moderate, generalized anxiety disorder, chronic PTSD, panic attacks  Collaboration of Care:  Other none needed  Patient/Guardian was advised Release of Information must be obtained prior to any record release in order to collaborate their care with an outside provider. Patient/Guardian was advised if they have not already done so to contact the registration department to sign all necessary forms in order for us  to release information regarding their care.   Consent: Patient/Guardian gives verbal consent for treatment and assignment of benefits for services provided during this visit. Patient/Guardian expressed understanding and agreed to proceed.   Dallie Duel, LCSW 06/06/2023

## 2023-06-11 ENCOUNTER — Ambulatory Visit: Payer: Self-pay | Admitting: Pulmonary Disease

## 2023-06-11 NOTE — Telephone Encounter (Signed)
 Copied from CRM [id number]. Topic: Clinical - Red Word Triage >> Jun 11, 2023  2:31 PM Lindsay [name] wrote: [facility] that prompted transfer to Nurse Triage: Out of breath, diff breath, can she increase her oxygen    Chief Complaint: Shortness of Breath Symptoms: Shortness of breath Frequency: 1-2 days Pertinent Negatives: Patient denies fever, chest pain, dizziness, vomiting, diarrhea,  Disposition: [] ED /[] Urgent Care (no appt availability in office) / [] Appointment(In office/virtual)/ []  [place] [facility]/ [] Home Care/ [x] Refused Recommended Disposition /[] [place] [facility]/ []  Follow-up with PCP Additional Notes: Patient called and advised that she has not taken any Prednisone  for the past two days because she is out of it. Pulmonology told patient to see her Cardiologist and she is going to see them next week. Patient has turned her home oxygen  up from 3 to 5.  Patient states that she can't get her pulse oximetry to turn on at this time.  This RN recommended for her to try to find a replacement battery so she can keep an eye on her Oxygen  levels. She states that she will try to find a battery.  Patient is advised that the recommendation at this time is that she is seen by a provider in the next 4 hours. Patient states that she is unable to get a ride to any appointments or Urgent Care at this time. She is advised that she can call 911 at any time.  Patient states that she doesn't feel bad to the point she feels like she needs to call 911.  Patient wanted to know if her Pulmonologist would prescribe her Prednisone .  She states that she felt better when she was taking it recently.  Patient states that she feels better now and patient does sound a lot better. She advised that she is going to rest and if she gets worse she will call 911 for an ambulance to take her to the Emergency Room. Patient is advised not to hesitate to call 911 for an ambulance to take her to the Emergency Room if  she gets worse. Patient verbalized understanding.       Reason for Disposition  [1] Longstanding difficulty breathing (e.g., CHF, COPD, emphysema) AND [2] WORSE than normal  Answer Assessment - Initial Assessment Questions E2C2 Pulmonary Triage - Initial Assessment Questions "Chief Complaint (e.g., cough, sob, wheezing, fever, chills, sweat or additional symptoms) *Go to specific symptom protocol after initial questions. Shortness of breath---started yesterday and today is worse  "How long have symptoms been present?" 1-2 days  Have you tested for COVID or Flu? Note: If not, ask patient if a home test can be taken. If so, instruct patient to call back for positive results. No  MEDICINES:   "Have you used any OTC meds to help with symptoms?" Yes If yes, ask "What medications?" Robitussin Tylenol  3 days ago  "Have you used your inhalers/maintenance medication?" Yes If yes, "What medications?" Albuterol --every 6 hours as needed Albuterol  Inhaler--2 puffs every 6 hours Duoneb Nebulizer solution--two times daily  If inhaler, ask "How many puffs and how often?" Note: Review instructions on medication in the chart. Albuterol --every 6 hours as needed Albuterol  Inhaler--2 puffs every 6 hours Duoneb Nebulizer solution--two times daily  OXYGEN : "Do you wear supplemental oxygen ?" Yes If yes, "How many liters are you supposed to use?" 4 but turned it up to 5  "Do you monitor your oxygen  levels?" Yes If yes, "What is your reading (oxygen  level) today?" Patient took her oxygen  off and it  was 86% and then she put her oxygen  back on and it went to 98%  "What is your usual oxygen  saturation reading?"  (Note: Pulmonary O2 sats should be 90% or greater) 90-something per patient    1. RESPIRATORY STATUS: "Describe your breathing?" (e.g., wheezing, shortness of breath, unable to speak, severe coughing)      Shortness of breath 2. ONSET: "When did this breathing problem begin?"       1-2 days ago 3. PATTERN "Does the difficult breathing come and go, or has it been constant since it started?"      Comes and goes 4. SEVERITY: "How bad is your breathing?" (e.g., mild, moderate, severe)    - MILD: No SOB at rest, mild SOB with walking, speaks normally in sentences, can lie down, no retractions, pulse < 100.    - MODERATE: SOB at rest, SOB with minimal exertion and prefers to sit, cannot lie down flat, speaks in phrases, mild retractions, audible wheezing, pulse 100-120.    - SEVERE: Very SOB at rest, speaks in single words, struggling to breathe, sitting hunched forward, retractions, pulse > 120      Patient states not always short of breath 5. RECURRENT SYMPTOM: "Have you had difficulty breathing before?" If Yes, ask: "When was the last time?" and "What happened that time?"      When she was worse than this she was in the hospital 6. CARDIAC HISTORY: "Do you have any history of heart disease?" (e.g., heart attack, angina, bypass surgery, angioplasty)      Congestive heart failure 7. LUNG HISTORY: "Do you have any history of lung disease?"  (e.g., pulmonary embolus, asthma, emphysema)     COPD asthma and chronic respiratory failure and pulmonary hypertension 8. CAUSE: "What do you think is causing the breathing problem?"      Unsure exactly 9. OTHER SYMPTOMS: "Do you have any other symptoms? (e.g., dizziness, runny nose, cough, chest pain, fever)     A slight cough but then it was gone after cough syrup  Protocols used: Breathing Difficulty-A-AH

## 2023-06-12 ENCOUNTER — Other Ambulatory Visit: Payer: Self-pay | Admitting: Pulmonary Disease

## 2023-06-12 ENCOUNTER — Other Ambulatory Visit: Payer: Self-pay | Admitting: *Deleted

## 2023-06-12 ENCOUNTER — Telehealth: Payer: Self-pay

## 2023-06-12 ENCOUNTER — Other Ambulatory Visit: Payer: Self-pay | Admitting: Physician Assistant

## 2023-06-12 ENCOUNTER — Encounter: Payer: Self-pay | Admitting: Pulmonary Disease

## 2023-06-12 DIAGNOSIS — G4733 Obstructive sleep apnea (adult) (pediatric): Secondary | ICD-10-CM

## 2023-06-12 DIAGNOSIS — K589 Irritable bowel syndrome without diarrhea: Secondary | ICD-10-CM

## 2023-06-12 MED ORDER — PREDNISONE 10 MG PO TABS: ORAL_TABLET | ORAL | 0 refills | Status: DC

## 2023-06-12 NOTE — Telephone Encounter (Signed)
 Dr. Gaynell Keeler, Please advise.

## 2023-06-12 NOTE — Patient Instructions (Signed)
 Visit Information  Thank you for taking time to visit with me today. Please don't hesitate to contact me if I can be of assistance to you before our next scheduled appointment.  Telephone follow up appointment date/time:  5/21 at 10am  Please call the care guide team at [phone] if you need to cancel, schedule, or reschedule an appointment.   Please call the Suicide and Crisis Lifeline: 988 call the [place]  National Suicide Prevention Lifeline: 6011071122 or TTY: 360-689-9779 TTY 5711433078) to talk to a trained counselor call 1-800-273-TALK (toll free, 24 hour hotline) call 911 if you are experiencing a Mental Health or Behavioral Health Crisis or need someone to talk to.  Holland Lundborg, RN, MSN, CCM [facility] RN Care Coordinator Direct Dial: [phone] / Main [phone] Fax [phone] Email: [email or web address]

## 2023-06-12 NOTE — Telephone Encounter (Signed)
 Order is pending.  Lm for patient to confirm DME.

## 2023-06-12 NOTE — Patient Outreach (Signed)
 Complex Care Management   Visit Note  06/12/2023  Name:  Lindsay [name] MRN: [id number] DOB: September 15, 1970  Situation: Referral received for Complex Care Management related to COPD I obtained verbal consent from Patient.  Visit completed with patient  on the phone  Background:   Past Medical History:  Diagnosis Date   Acid reflux    Anemia    Iron  Def   Anorexia    CHF (congestive heart failure) (HCC)    Chronic kidney disease    Nephrotic syndrome   Colon polyp 2009   Depression with anxiety    Edema leg    Fibroid tumor 04/2022   Fibromyalgia    Hemorrhoids    Hidradenitis suppurativa    Hypertension    IBS (irritable bowel syndrome)    Low back pain    Migraines    Morbidly obese (HCC)    Neuromuscular disorder (HCC)    fibromyalgia   Neuropathy    Panic attacks    Polyp of vocal cord or larynx    Stroke (HCC)    Tonsil pain     Assessment: Patient Reported Symptoms:  Cognitive Cognitive Status: Alert and oriented to person, place, and time, Normal speech and language skills Cognitive/Intellectual Conditions Management [RPT]: None reported or documented in medical history or problem list      Neurological Neurological Review of Symptoms: No symptoms reported    HEENT HEENT Symptoms Reported: No symptoms reported      Cardiovascular Cardiovascular Symptoms Reported: Palpitations Cardiovascular Comment: Will see cardiology on Monday  Respiratory Respiratory Symptoms Reported: Shortness of breath Additional Respiratory Details: Remains on steroids Respiratory Comment: home O2 increased to 4L, oxygen  saturations 92%  Endocrine Patient reports the following symptoms related to hypoglycemia or hyperglycemia : No symptoms reported    Gastrointestinal Gastrointestinal Symptoms Reported: No symptoms reported      Genitourinary Genitourinary Symptoms Reported: No symptoms reported    Integumentary Integumentary Symptoms Reported: No symptoms reported     Musculoskeletal Musculoskelatal Symptoms Reviewed: No symptoms reported        Psychosocial Psychosocial Symptoms Reported: No symptoms reported            05/28/2023    1:32 PM  Depression screen PHQ 2/9  Decreased Interest 1  Down, Depressed, Hopeless 1  PHQ - 2 Score 2  Altered sleeping 2  Tired, decreased energy 2  Change in appetite 1  Feeling bad or failure about yourself  0  Trouble concentrating 1  Moving slowly or fidgety/restless 1  Suicidal thoughts 0  PHQ-9 Score 9  Difficult doing work/chores Somewhat difficult    There were no vitals filed for this visit.  Medications Reviewed Today     Reviewed by Holland Lundborg, RN (Registered Nurse) on 06/12/23 at 1409  Med List Status: <None>   Medication Order Taking? Sig Documenting Provider Last Dose Status Informant  ACCU-CHEK GUIDE test strip [id number] No USE ONE TEST STRIP TO CHECK BLOOD SUGARS ONCE A DAY AS NEEDED Lasandra Points, MD Taking Active Self  Accu-Chek Softclix Lancets lancets [id number] No USE ONE LANCET TO CHECK BLOOD SUGAR ONE A DAY AS NEEDED Lasandra Points, MD Taking Active Self  acetaminophen  (TYLENOL ) 500 MG tablet [id number] No Take 1 tablet (500 mg total) by mouth every 6 (six) hours as needed.  Patient taking differently: Take 500 mg by mouth every 6 (six) hours as needed for mild pain (pain score 1-3).   Deatra Face, MD Taking Active Self  albuterol  (PROVENTIL ) (2.5 MG/3ML)  0.083% nebulizer solution [id number] No Take 3 mLs (2.5 mg total) by nebulization every 6 (six) hours as needed for wheezing or shortness of breath. Guilloud, Carolyn, MD Taking Active Self  albuterol  (VENTOLIN  HFA) [phone] Base) MCG/ACT inhaler [id number] No TAKE 2 PUFFS BY MOUTH EVERY 6 HOURS AS NEEDED FOR WHEEZE OR SHORTNESS OF BREATH Olalere, Adewale A, MD Taking Active Self  atorvastatin  (LIPITOR) 40 MG tablet [id number] No TAKE 1 TABLET BY MOUTH EVERY DAY Lasandra Points, MD Taking Active Self  Azelastine  HCl  137 MCG/SPRAY SOLN [id number] No PLACE 1 SPRAY INTO BOTH NOSTRILS 2 (TWO) TIMES DAILY. USE IN EACH NOSTRIL AS DIRECTED  Patient taking differently: Place 1 spray into both nostrils daily as needed (for allergies).   Lasandra Points, MD Taking Active Self           Med Note (SATTERFIELD, DARIUS E   Fri May 23, 2023  8:27 PM) Still has on hand   Blood Glucose Monitoring Suppl (ACCU-CHEK GUIDE ME) w/Device KIT [id number] No Dispense one device Lasandra Points, MD Taking Active Self  Brexpiprazole  (REXULTI ) 3 MG TABS [id number] No Take 1 tablet (3 mg total) by mouth daily. [name], Syed T, MD Taking Active Self  cetirizine  (ZYRTEC ) 10 MG tablet [id number] No Take 1 tablet (10 mg total) by mouth daily.  Patient taking differently: Take 10 mg by mouth every other day.   Guilloud, Carolyn, MD Taking Active Self  cyclobenzaprine  (FLEXERIL ) 10 MG tablet [id number] No Take 10 mg by mouth 3 (three) times daily. [provider] Taking Active Self  dicyclomine  (BENTYL ) 10 MG capsule [id number] No TAKE 2 CAPSULES (20 MG TOTAL) BY MOUTH 3 (THREE) TIMES DAILY AS NEEDED FOR SPASMS (ABDOMINAL PAIN). Edmonia Gottron, PA-C Taking Active Self  doxepin  (SINEQUAN ) 25 MG capsule [id number]  Take 1 capsule (25 mg total) by mouth at bedtime. [name], Syed T, MD  Active   doxycycline  (VIBRA -TABS) 100 MG tablet [id number]  Take 1 tablet (100 mg total) by mouth 2 (two) times daily. Margaretann Sharper, MD  Active   ENTRESTO  24-26 MG [id number] No TAKE 1 TABLET BY MOUTH TWICE A DAY Guilloud, Carolyn, MD Taking Active Self  fluticasone  (FLONASE ) 50 MCG/ACT nasal spray [id number] No SPRAY 1 SPRAY INTO EACH NOSTRIL DAILY  Patient taking differently: Place 1 spray into both nostrils daily as needed for allergies.   Guilloud, Carolyn, MD Taking Active Self           Med Note (SATTERFIELD, Ken Patty   Fri May 23, 2023  8:23 PM) Still has on hand   guaiFENesin  (ROBITUSSIN) 100 MG/5ML liquid [id number] No Take 10 mLs by mouth every 6  (six) hours as needed for cough or to loosen phlegm. Vada Garibaldi, MD Taking Active   hydrocortisone  (ANUSOL -HC) 2.5 % rectal cream [id number] No Place 1 Application rectally daily as needed for hemorrhoids. [provider] Taking Active Self           Med Note (SATTERFIELD, DARIUS E   Fri May 23, 2023  8:23 PM) Still has on hand   ibuprofen  (ADVIL ) 800 MG tablet [id number] No Take 800 mg by mouth every 8 (eight) hours as needed for mild pain (pain score 1-3). [provider] Taking Active Self  ipratropium (ATROVENT ) 0.06 % nasal spray [id number] No Place 2 sprays into both nostrils daily as needed for rhinitis. [provider] Taking Active Self           Med Note (SATTERFIELD, DARIUS E  Fri May 23, 2023  8:25 PM) Still has on hand   ipratropium-albuterol  (DUONEB) 0.5-2.5 (3) MG/3ML SOLN [id number] No Take 3 mLs by nebulization 2 (two) times daily. Vada Garibaldi, MD Taking Active   lamoTRIgine  (LAMICTAL ) 100 MG tablet [id number]  Take 0.5 tablets (50 mg total) by mouth 2 (two) times daily. Arturo Late, MD  Active   LINZESS  290 MCG CAPS capsule [id number] No TAKE 1 CAPSULE BY MOUTH DAILY BEFORE BREAKFAST. Edmonia Gottron, PA-C Taking Active Self  megestrol  (MEGACE ) 40 MG tablet [id number] No Take 2 tablets (80 mg total) by mouth daily.  Patient taking differently: Take 80 mg by mouth daily as needed (appetite).   Granville Layer, MD Taking Active Self  metoprolol  tartrate (LOPRESSOR ) 25 MG tablet [id number] No Take 25 mg by mouth daily. [provider] Taking Active Self  omeprazole  (PRILOSEC) 40 MG capsule [id number] No TAKE 1 CAPSULE (40 MG TOTAL) BY MOUTH DAILY. Edmonia Gottron, PA-C Taking Active Self  ondansetron  (ZOFRAN -ODT) 8 MG disintegrating tablet [id number] No Take 1 tablet (8 mg total) by mouth every 8 (eight) hours as needed.  Patient taking differently: Take 8 mg by mouth every 8 (eight) hours as needed for nausea or vomiting.   Driscilla George, MD  Taking Active Self  oxyCODONE  (OXY IR/ROXICODONE ) 5 MG immediate release tablet [id number] No Take 5 mg by mouth 2 (two) times daily. [provider] Taking Active Self           Med Note (SATTERFIELD, Lovella Rubin E   Fri May 23, 2023  8:35 PM) Patient verified she is taking every day   predniSONE  (DELTASONE ) 10 MG tablet [id number] No 3 tabs daily for 3 days 2 tabs daily for 3 days 1 tabs daily for 3 days Vada Garibaldi, MD Taking Active   predniSONE  (DELTASONE ) 10 MG tablet [id number]  20 mg daily for 5 days and then 10 mg daily for 5 days Margaretann Sharper, MD  Active   predniSONE  (DELTASONE ) 20 MG tablet [id number]  Take 1 tablet (20 mg total) by mouth daily with breakfast. Myer Artis A, MD  Active   promethazine  (PHENERGAN ) 25 MG tablet [id number]  Take 1 tablet (25 mg total) by mouth every 6 (six) hours as needed for nausea or vomiting. Devorah Fonder  Active   Safety Seal Miscellaneous MISC [id number] No Apply 1 Application topically in the morning. Medication Name; AA Gel Dellar Fenton, DO Taking Active Self  Semaglutide , 2 MG/DOSE, (OZEMPIC , 2 MG/DOSE,) 8 MG/3ML SOPN [id number] No INJECT 2 MG INTO THE SKIN ONCE A WEEK. Guilloud, Carolyn, MD Taking Active Self           Med Note (SATTERFIELD, Ken Patty   Fri May 23, 2023  8:02 PM) Take on Fridays  SYMBICORT  160-4.5 MCG/ACT inhaler [id number] No Inhale 2 puffs into the lungs 2 (two) times daily. [provider] Taking Active Self  torsemide  (DEMADEX ) 20 MG tablet [id number] No TAKE 1 TABLET BY MOUTH TWICE A DAY Guilloud, Carolyn, MD Taking Active Self            Recommendation:   Specialty provider follow-up Keep and attend cardiology follow up on 5/19 to develop plan for testing and treatment.    Follow Up Plan:   Telephone follow up appointment date/time:  5/21 at 10am Will continue to collaborate with [employer] and pulmonology office to get DME   Holland Lundborg, RN, MSN, CCM [facility], [facility]  RN Care Coordinator Direct Dial: [phone] / Main [phone] Fax [phone] Email: [email or web address]

## 2023-06-12 NOTE — Telephone Encounter (Signed)
-----   Message from Adewale A Olalere sent at 06/12/2023 12:49 PM EDT ----- new CPAP  auto CPAP 5-18 with heated medication with AirFit P10 medium size mask  Thanks, should have been sent in on day of visit  Appreciate  u ----- Message ----- From: Janas Meadows, CMA Sent: 06/12/2023  10:49 AM EDT To: Margaretann Sharper, MD  I do not see an order. I'm happy to send one if you will verify her settings. ----- Message ----- From: Margaretann Sharper, MD Sent: 06/11/2023   9:20 PM EDT To: Janas Meadows, CMA  Kindly check to make sure [employer] received CPAP order   Call placed to [employer] to follow up on CPAP order per pulmonary visit yesterday.  Per [employer], they have not received orders as of yet.  Confirmed correct fax number is [phone], will collaborate with pulmonology office to ensure orders for CPAP and renewal of oxygen  are faxed.    Holland Lundborg, RN, MSN, CCM [facility], [facility] RN Care Coordinator Direct Dial: [phone] / Main [phone] Fax [phone] Email: [email or web address]: [email or web address]

## 2023-06-16 DIAGNOSIS — R0602 Shortness of breath: Secondary | ICD-10-CM | POA: Diagnosis not present

## 2023-06-16 DIAGNOSIS — I2723 Pulmonary hypertension due to lung diseases and hypoxia: Secondary | ICD-10-CM | POA: Diagnosis not present

## 2023-06-16 DIAGNOSIS — J449 Chronic obstructive pulmonary disease, unspecified: Secondary | ICD-10-CM | POA: Diagnosis not present

## 2023-06-16 NOTE — Telephone Encounter (Signed)
Order has been placed. Pt is aware and voiced her understanding. Nothing further needed.

## 2023-06-17 ENCOUNTER — Other Ambulatory Visit: Payer: Self-pay | Admitting: Pulmonary Disease

## 2023-06-17 ENCOUNTER — Ambulatory Visit (INDEPENDENT_AMBULATORY_CARE_PROVIDER_SITE_OTHER): Admitting: Licensed Clinical Social Worker

## 2023-06-17 DIAGNOSIS — F410 Panic disorder [episodic paroxysmal anxiety] without agoraphobia: Secondary | ICD-10-CM

## 2023-06-17 DIAGNOSIS — F4312 Post-traumatic stress disorder, chronic: Secondary | ICD-10-CM | POA: Diagnosis not present

## 2023-06-17 DIAGNOSIS — F411 Generalized anxiety disorder: Secondary | ICD-10-CM | POA: Diagnosis not present

## 2023-06-17 DIAGNOSIS — F331 Major depressive disorder, recurrent, moderate: Secondary | ICD-10-CM | POA: Diagnosis not present

## 2023-06-17 NOTE — Progress Notes (Signed)
 Virtual Visit via Video Note  I connected with Lindsay [name] on 06/17/23 at  9:00 AM EDT by a video enabled telemedicine application and verified that I am speaking with the correct person using two identifiers.  Location: Patient: home Provider: office   I discussed the limitations of evaluation and management by telemedicine and the availability of in person appointments. The patient expressed understanding and agreed to proceed.   I discussed the assessment and treatment plan with the patient. The patient was provided an opportunity to ask questions and all were answered. The patient agreed with the plan and demonstrated an understanding of the instructions.   The patient was advised to call back or seek an in-person evaluation if the symptoms worsen or if the condition fails to improve as anticipated.  I provided 45 minutes of non-face-to-face time during this encounter.  THERAPIST PROGRESS NOTE  Session Time: 9:00 AM to 9:45 AM  Participation Level: Active  Behavioral Response: CasualAlertAnxious and Depressed  Type of Therapy: Individual Therapy  Treatment Goals addressed: Patient reports therapy helps cope with her stressors continue to work on coping strategies to help improve symptoms of her diagnoses, coping in general ProgressTowards Goals: Progressing-therapy right now is important resource for patient helping her with coping with supportive strength-based interventions as she is undergoing significant stressors with medical issues  Interventions: Solution Focused, Strength-based, Supportive, and Other: coping, reframing  Summary: Lindsay [name] is a 53 y.o. female who presents with finding out need catheter on both right and left side, will come in at right wrist, or right side of neck. Get two catheters. Look at doctor like wow. Told her to lose weight will help with breathing. Numb patient not put under and patient said doesn't want to feel anything don't want feel 10 %  of anything. Doctors said don't know what difference it will be. Shortness of breath could be caused by heart. Needs to update medical record for re-certification to get to people who provide health oxygen . When procedure? Calling the hospital see when something is available to have it. Pulmonary doctor need to take care of this first and patient shares with therapist can't breath worse since the hospital.  Therapist agrees need to focus on this first.  Patient says ask them to change medicine Flexeril  not helping stiff like a robot. Did get her pain medication Oxycodone  and acetaminophen  they are supposed to reach out for pain management. Relief 10th of pain, takes 2x a day. Up at night hurting really bad didn't help anything. Tizanidine  wants to be put back on that again.  Therapist wanted to know where she is at right now holding pattern hopes that helps with breathing can't breath. Needs to see stomach doctor feels bloating in stomach. May have to see pulmonary. Did a breathing treatment this morning try to take a deep breath and can't do it.  Looks at procedure and therapist looks at it the same way do what need to be able to breath. Hopes procedure will help pulmonary said should help, heart doctor doesn't see how it would. Sister Edwina Gram went with her. Tell friend crying didn't want to tell her but wants her to know.  Trigger for her she just lost her dad. The after doing the procedure is her mom's internal doctor and heart doctor.  Noted knowing the doctor helps and patient agreed.  Patient vies use therapy she explains to therapist it is about self-care.  Therapist encouraged patient to hold onto spirituality as a good  support and patient says it is hard to do sometimes.  Therapist again noting why she looks for support and strength from therapy.       Reviewing what is going on with patient note therapy right now officer is the support she needs.  Patient keeps a lot of things to herself so it is a place  she can share what is going on plus she has some procedures coming up that are stressful.  As we talked about the medical plan both of us  are taking a big perspective of how though stressful the procedure patient having a hard time breathing so in some sense need to get this done and can help her with breathing positive that pulmonary said should help.  Patient also realizes right now needs to focus on this before looking at other things that come after.  Reviewed treatment plan again patient finds therapy very helpful for coping and managing stress she gave consent to complete virtually in session therapist working on reframing and ways to help reduce stress such as looking up catheter procedure noting it is normally regular 1 but also recognizing patient's medical risks make it scarier but at the same time a necessary thing needs to be done to help breathing.  Therapist provided space and support for patient to process thoughts and feelings in session. Suicidal/Homicidal: No  Plan: Return again in 2 weeks.2.  Continue to provide supportive strength-based interventions perspectives that help with coping  Diagnosis: Major depressive disorder, recurrent, moderate, generalized anxiety disorder, chronic PTSD, panic attacks   Collaboration of Care: Other none needed  Patient/Guardian was advised Release of Information must be obtained prior to any record release in order to collaborate their care with an outside provider. Patient/Guardian was advised if they have not already done so to contact the registration department to sign all necessary forms in order for us  to release information regarding their care.   Consent: Patient/Guardian gives verbal consent for treatment and assignment of benefits for services provided during this visit. Patient/Guardian expressed understanding and agreed to proceed.   Dallie Duel, LCSW 06/17/2023

## 2023-06-18 ENCOUNTER — Other Ambulatory Visit: Payer: Self-pay | Admitting: *Deleted

## 2023-06-18 DIAGNOSIS — G43009 Migraine without aura, not intractable, without status migrainosus: Secondary | ICD-10-CM | POA: Diagnosis not present

## 2023-06-18 DIAGNOSIS — R0602 Shortness of breath: Secondary | ICD-10-CM | POA: Diagnosis not present

## 2023-06-18 DIAGNOSIS — E1165 Type 2 diabetes mellitus with hyperglycemia: Secondary | ICD-10-CM | POA: Diagnosis not present

## 2023-06-18 DIAGNOSIS — I110 Hypertensive heart disease with heart failure: Secondary | ICD-10-CM | POA: Diagnosis not present

## 2023-06-18 DIAGNOSIS — G894 Chronic pain syndrome: Secondary | ICD-10-CM | POA: Diagnosis not present

## 2023-06-18 DIAGNOSIS — I5022 Chronic systolic (congestive) heart failure: Secondary | ICD-10-CM | POA: Diagnosis not present

## 2023-06-18 DIAGNOSIS — D75838 Other thrombocytosis: Secondary | ICD-10-CM | POA: Diagnosis not present

## 2023-06-18 DIAGNOSIS — J4540 Moderate persistent asthma, uncomplicated: Secondary | ICD-10-CM | POA: Diagnosis not present

## 2023-06-18 DIAGNOSIS — D509 Iron deficiency anemia, unspecified: Secondary | ICD-10-CM | POA: Diagnosis not present

## 2023-06-18 NOTE — Patient Instructions (Signed)
 Visit Information  Thank you for taking time to visit with me today. Please don't hesitate to contact me if I can be of assistance to you before our next scheduled appointment.  Telephone follow up appointment date/time:  5/23 at 10am  Please call the care guide team at [phone] if you need to cancel, schedule, or reschedule an appointment.   Please call the Suicide and Crisis Lifeline: 988 call the [place]  National Suicide Prevention Lifeline: (630)349-4205 or TTY: (918)165-0078 TTY 409-782-9043) to talk to a trained counselor call 1-800-273-TALK (toll free, 24 hour hotline) call 911 if you are experiencing a Mental Health or Behavioral Health Crisis or need someone to talk to.  Holland Lundborg, RN, MSN, CCM [facility], [facility] RN Care Coordinator Direct Dial: [phone] / Main [phone] Fax [phone] Email: [email or web address]: [email or web address]

## 2023-06-18 NOTE — Patient Outreach (Signed)
 Complex Care Management   Visit Note  06/18/2023  Name:  Lindsay [name] MRN: [id number] DOB: 04/11/1970  Situation: Referral received for Complex Care Management related to Heart Failure and COPD I obtained verbal consent from Patient.  Visit completed with patient  on the phone  Patient report still having shortness of breath, did not check oxygen  level this morning as she was trying to get to PCP office for urgent visit, state she is waiting to see if she can be seen (she thought the office had urgent care abilities).  State she would like to be seen at her pulmonary office versus going to urgent care or ED, but she will not have transportation to office.  State if she is not seen by PCP today, she will call EMS to be evaluated at the ED.   Background:   Past Medical History:  Diagnosis Date   Acid reflux    Anemia    Iron  Def   Anorexia    CHF (congestive heart failure) (HCC)    Chronic kidney disease    Nephrotic syndrome   Colon polyp 2009   Depression with anxiety    Edema leg    Fibroid tumor 04/2022   Fibromyalgia    Hemorrhoids    Hidradenitis suppurativa    Hypertension    IBS (irritable bowel syndrome)    Low back pain    Migraines    Morbidly obese (HCC)    Neuromuscular disorder (HCC)    fibromyalgia   Neuropathy    Panic attacks    Polyp of vocal cord or larynx    Stroke (HCC)    Tonsil pain     Assessment: Patient Reported Symptoms:  Cognitive Cognitive Status: Alert and oriented to person, place, and time, Normal speech and language skills Cognitive/Intellectual Conditions Management [RPT]: None reported or documented in medical history or problem list      Neurological Neurological Review of Symptoms: No symptoms reported    HEENT HEENT Symptoms Reported: No symptoms reported      Cardiovascular Cardiovascular Symptoms Reported: Palpitations Does patient have uncontrolled Hypertension?: No Cardiovascular Conditions: Heart failure  Respiratory  Respiratory Symptoms Reported: Shortness of breath Respiratory Conditions: Shortness of breath, COPD Respiratory Self-Management Outcome: 2 (bad)  Endocrine Patient reports the following symptoms related to hypoglycemia or hyperglycemia : No symptoms reported    Gastrointestinal Gastrointestinal Symptoms Reported: No symptoms reported      Genitourinary Genitourinary Symptoms Reported: No symptoms reported    Integumentary Integumentary Symptoms Reported: No symptoms reported    Musculoskeletal Musculoskelatal Symptoms Reviewed: No symptoms reported        Psychosocial Psychosocial Symptoms Reported: No symptoms reported            05/28/2023    1:32 PM  Depression screen PHQ 2/9  Decreased Interest 1  Down, Depressed, Hopeless 1  PHQ - 2 Score 2  Altered sleeping 2  Tired, decreased energy 2  Change in appetite 1  Feeling bad or failure about yourself  0  Trouble concentrating 1  Moving slowly or fidgety/restless 1  Suicidal thoughts 0  PHQ-9 Score 9  Difficult doing work/chores Somewhat difficult    There were no vitals filed for this visit.  Medications Reviewed Today     Reviewed by Holland Lundborg, RN (Registered Nurse) on 06/18/23 at 1335  Med List Status: <None>   Medication Order Taking? Sig Documenting Provider Last Dose Status Informant  ACCU-CHEK GUIDE test strip [id number] No USE ONE TEST STRIP TO  CHECK BLOOD SUGARS ONCE A DAY AS NEEDED Lasandra Points, MD Taking Active Self  Accu-Chek Softclix Lancets lancets [id number] No USE ONE LANCET TO CHECK BLOOD SUGAR ONE A DAY AS NEEDED Lasandra Points, MD Taking Active Self  acetaminophen  (TYLENOL ) 500 MG tablet [id number] No Take 1 tablet (500 mg total) by mouth every 6 (six) hours as needed.  Patient taking differently: Take 500 mg by mouth every 6 (six) hours as needed for mild pain (pain score 1-3).   Deatra Face, MD Taking Active Self  albuterol  (PROVENTIL ) (2.5 MG/3ML) 0.083% nebulizer solution  [id number] No Take 3 mLs (2.5 mg total) by nebulization every 6 (six) hours as needed for wheezing or shortness of breath. Guilloud, Carolyn, MD Taking Active Self  albuterol  (VENTOLIN  HFA) [phone] Base) MCG/ACT inhaler [id number] No TAKE 2 PUFFS BY MOUTH EVERY 6 HOURS AS NEEDED FOR WHEEZE OR SHORTNESS OF BREATH Olalere, Adewale A, MD Taking Active Self  atorvastatin  (LIPITOR) 40 MG tablet [id number] No TAKE 1 TABLET BY MOUTH EVERY DAY Lasandra Points, MD Taking Active Self  Azelastine  HCl 137 MCG/SPRAY SOLN [id number] No PLACE 1 SPRAY INTO BOTH NOSTRILS 2 (TWO) TIMES DAILY. USE IN EACH NOSTRIL AS DIRECTED  Patient taking differently: Place 1 spray into both nostrils daily as needed (for allergies).   Lasandra Points, MD Taking Active Self           Med Note (SATTERFIELD, DARIUS E   Fri May 23, 2023  8:27 PM) Still has on hand   Blood Glucose Monitoring Suppl (ACCU-CHEK GUIDE ME) w/Device KIT [id number] No Dispense one device Lasandra Points, MD Taking Active Self  Brexpiprazole  (REXULTI ) 3 MG TABS [id number] No Take 1 tablet (3 mg total) by mouth daily. [name], Syed T, MD Taking Active Self  cetirizine  (ZYRTEC ) 10 MG tablet [id number] No Take 1 tablet (10 mg total) by mouth daily.  Patient taking differently: Take 10 mg by mouth every other day.   Guilloud, Carolyn, MD Taking Active Self  cyclobenzaprine  (FLEXERIL ) 10 MG tablet [id number] No Take 10 mg by mouth 3 (three) times daily. [provider] Taking Active Self  dicyclomine  (BENTYL ) 10 MG capsule [id number]  TAKE 2 CAPSULES (20 MG TOTAL) BY MOUTH 3 (THREE) TIMES DAILY AS NEEDED FOR SPASMS (ABDOMINAL PAIN). Edmonia Gottron, PA-C  Active   doxepin  (SINEQUAN ) 25 MG capsule [id number]  Take 1 capsule (25 mg total) by mouth at bedtime. [name], Syed T, MD  Active   doxycycline  (VIBRA -TABS) 100 MG tablet [id number]  Take 1 tablet (100 mg total) by mouth 2 (two) times daily. Margaretann Sharper, MD  Active   ENTRESTO  24-26 MG [id number] No TAKE  1 TABLET BY MOUTH TWICE A DAY Guilloud, Carolyn, MD Taking Active Self  fluticasone  (FLONASE ) 50 MCG/ACT nasal spray [id number] No SPRAY 1 SPRAY INTO EACH NOSTRIL DAILY  Patient taking differently: Place 1 spray into both nostrils daily as needed for allergies.   Guilloud, Carolyn, MD Taking Active Self           Med Note (SATTERFIELD, Ken Patty   Fri May 23, 2023  8:23 PM) Still has on hand   guaiFENesin  (ROBITUSSIN) 100 MG/5ML liquid [id number] No Take 10 mLs by mouth every 6 (six) hours as needed for cough or to loosen phlegm. Vada Garibaldi, MD Taking Active   hydrocortisone  (ANUSOL -HC) 2.5 % rectal cream [id number] No Place 1 Application rectally daily as needed for hemorrhoids. [provider] Taking Active Self  Med Note (SATTERFIELD, DARIUS E   Fri May 23, 2023  8:23 PM) Still has on hand   ibuprofen  (ADVIL ) 800 MG tablet [id number] No Take 800 mg by mouth every 8 (eight) hours as needed for mild pain (pain score 1-3). [provider] Taking Active Self  ipratropium (ATROVENT ) 0.06 % nasal spray [id number] No Place 2 sprays into both nostrils daily as needed for rhinitis. [provider] Taking Active Self           Med Note (SATTERFIELD, DARIUS E   Fri May 23, 2023  8:25 PM) Still has on hand   ipratropium-albuterol  (DUONEB) 0.5-2.5 (3) MG/3ML SOLN [id number] No Take 3 mLs by nebulization 2 (two) times daily. Vada Garibaldi, MD Taking Active   lamoTRIgine  (LAMICTAL ) 100 MG tablet [id number]  Take 0.5 tablets (50 mg total) by mouth 2 (two) times daily. Arturo Late, MD  Active   LINZESS  290 MCG CAPS capsule [id number] No TAKE 1 CAPSULE BY MOUTH DAILY BEFORE BREAKFAST. Edmonia Gottron, PA-C Taking Active Self  megestrol  (MEGACE ) 40 MG tablet [id number] No Take 2 tablets (80 mg total) by mouth daily.  Patient taking differently: Take 80 mg by mouth daily as needed (appetite).   Granville Layer, MD Taking Active Self  metoprolol  tartrate (LOPRESSOR ) 25 MG tablet  [id number] No Take 25 mg by mouth daily. [provider] Taking Active Self  omeprazole  (PRILOSEC) 40 MG capsule [id number] No TAKE 1 CAPSULE (40 MG TOTAL) BY MOUTH DAILY. Edmonia Gottron, PA-C Taking Active Self  ondansetron  (ZOFRAN -ODT) 8 MG disintegrating tablet [id number] No Take 1 tablet (8 mg total) by mouth every 8 (eight) hours as needed.  Patient taking differently: Take 8 mg by mouth every 8 (eight) hours as needed for nausea or vomiting.   Driscilla George, MD Taking Active Self  oxyCODONE  (OXY IR/ROXICODONE ) 5 MG immediate release tablet [id number] No Take 5 mg by mouth 2 (two) times daily. [provider] Taking Active Self           Med Note (SATTERFIELD, Lovella Rubin E   Fri May 23, 2023  8:35 PM) Patient verified she is taking every day   predniSONE  (DELTASONE ) 10 MG tablet [id number] No 3 tabs daily for 3 days 2 tabs daily for 3 days 1 tabs daily for 3 days Vada Garibaldi, MD Taking Active   predniSONE  (DELTASONE ) 10 MG tablet [id number]  20 mg daily for 5 days and then 10 mg daily for 5 days Margaretann Sharper, MD  Active   predniSONE  (DELTASONE ) 20 MG tablet [id number]  Take 1 tablet (20 mg total) by mouth daily with breakfast. Myer Artis A, MD  Active   promethazine  (PHENERGAN ) 25 MG tablet [id number]  Take 1 tablet (25 mg total) by mouth every 6 (six) hours as needed for nausea or vomiting. Devorah Fonder  Active   Safety Seal Miscellaneous MISC [id number] No Apply 1 Application topically in the morning. Medication Name; AA Gel Dellar Fenton, DO Taking Active Self  Semaglutide , 2 MG/DOSE, (OZEMPIC , 2 MG/DOSE,) 8 MG/3ML SOPN [id number] No INJECT 2 MG INTO THE SKIN ONCE A WEEK. Guilloud, Carolyn, MD Taking Active Self           Med Note (SATTERFIELD, Ken Patty   Fri May 23, 2023  8:02 PM) Take on Fridays  SYMBICORT  160-4.5 MCG/ACT inhaler [id number] No Inhale 2 puffs into the lungs 2 (two) times daily. [provider] Taking Active Self  torsemide   (DEMADEX ) 20 MG  tablet [id number] No TAKE 1 TABLET BY MOUTH TWICE A DAY Guilloud, Carolyn, MD Taking Active Self            Recommendation:   Acute PCP follow-up currently at PCP office trying to be seen for shortness of breath. DME requests:  other oxygen  recert and CPAP order - [employer] has oxygen  orders, but none for CPAP yet Patient still having shortness of breath, if unable to be seen by PCP, advised to go to ED  Follow Up Plan:   Telephone follow up appointment date/time:  5/23 at 10am  Holland Lundborg, RN, MSN, CCM [facility], [facility] Health RN Care Coordinator Direct Dial: [phone] / Main [phone] Fax [phone] Email: [email or web address]

## 2023-06-20 ENCOUNTER — Other Ambulatory Visit: Payer: Self-pay | Admitting: *Deleted

## 2023-06-20 ENCOUNTER — Ambulatory Visit (HOSPITAL_COMMUNITY): Admitting: Licensed Clinical Social Worker

## 2023-06-20 NOTE — Patient Instructions (Signed)
 Visit Information  Thank you for taking time to visit with me today. Please don't hesitate to contact me if I can be of assistance to you before our next scheduled appointment.  Telephone follow up appointment date/time:  5/28 at 1pm  Please call the care guide team at [phone] if you need to cancel, schedule, or reschedule an appointment.   Please call the Suicide and Crisis Lifeline: 988 call the [place]  National Suicide Prevention Lifeline: (915) 623-9936 or TTY: 8076837064 TTY (985) 455-3476) to talk to a trained counselor call 1-800-273-TALK (toll free, 24 hour hotline) call 911 if you are experiencing a Mental Health or Behavioral Health Crisis or need someone to talk to.  Holland Lundborg, RN, MSN, CCM [facility], [facility] Health RN Care Coordinator Direct Dial: [phone] / Main [phone] Fax [phone] Email: [email or web address]

## 2023-06-20 NOTE — Patient Outreach (Signed)
 Complex Care Management   Visit Note  06/20/2023  Name:  Lindsay [name] MRN: [id number] DOB: 13-Dec-1970  Situation: Referral received for Complex Care Management related to COPD I obtained verbal consent from Patient.  Visit completed with patient  on the phone  Report she was seen by the PCP on Tuesday and was advised to go to the ED for her shortness of breath.  She didn't go to be evaluated, state she went home instead and took a breathing treatment.  State she is feeling a little better, but still not "great."  She is still waiting for cardiology to call with date/time for heart cath, denies chest pain.  Advised again to go to ED if shortness of breath does not resolve with oxygen  and nebulizer treatment.    Background:   Past Medical History:  Diagnosis Date   Acid reflux    Anemia    Iron  Def   Anorexia    CHF (congestive heart failure) (HCC)    Chronic kidney disease    Nephrotic syndrome   Colon polyp 2009   Depression with anxiety    Edema leg    Fibroid tumor 04/2022   Fibromyalgia    Hemorrhoids    Hidradenitis suppurativa    Hypertension    IBS (irritable bowel syndrome)    Low back pain    Migraines    Morbidly obese (HCC)    Neuromuscular disorder (HCC)    fibromyalgia   Neuropathy    Panic attacks    Polyp of vocal cord or larynx    Stroke (HCC)    Tonsil pain     Assessment: Patient Reported Symptoms:  Cognitive Cognitive Status: Alert and oriented to person, place, and time, Normal speech and language skills Cognitive/Intellectual Conditions Management [RPT]: None reported or documented in medical history or problem list      Neurological Neurological Review of Symptoms: No symptoms reported    HEENT HEENT Symptoms Reported: No symptoms reported      Cardiovascular Cardiovascular Symptoms Reported: No symptoms reported    Respiratory Respiratory Symptoms Reported: Shortness of breath Respiratory Conditions: COPD Respiratory Comment: oxygen   saturations 86-89% without oxygen , increased to 94% once on O2  Endocrine Patient reports the following symptoms related to hypoglycemia or hyperglycemia : No symptoms reported    Gastrointestinal Gastrointestinal Symptoms Reported: No symptoms reported      Genitourinary Genitourinary Symptoms Reported: No symptoms reported    Integumentary Integumentary Symptoms Reported: No symptoms reported    Musculoskeletal Musculoskelatal Symptoms Reviewed: No symptoms reported        Psychosocial Psychosocial Symptoms Reported: No symptoms reported            05/28/2023    1:32 PM  Depression screen PHQ 2/9  Decreased Interest 1  Down, Depressed, Hopeless 1  PHQ - 2 Score 2  Altered sleeping 2  Tired, decreased energy 2  Change in appetite 1  Feeling bad or failure about yourself  0  Trouble concentrating 1  Moving slowly or fidgety/restless 1  Suicidal thoughts 0  PHQ-9 Score 9  Difficult doing work/chores Somewhat difficult    There were no vitals filed for this visit.  Medications Reviewed Today     Reviewed by Holland Lundborg, RN (Registered Nurse) on 06/20/23 at 1135  Med List Status: <None>   Medication Order Taking? Sig Documenting Provider Last Dose Status Informant  ACCU-CHEK GUIDE test strip [id number] No USE ONE TEST STRIP TO CHECK BLOOD SUGARS ONCE A DAY AS NEEDED  Guilloud, Carolyn, MD Taking Active Self  Accu-Chek Softclix Lancets lancets [id number] No USE ONE LANCET TO CHECK BLOOD SUGAR ONE A DAY AS NEEDED Lasandra Points, MD Taking Active Self  acetaminophen  (TYLENOL ) 500 MG tablet [id number] No Take 1 tablet (500 mg total) by mouth every 6 (six) hours as needed.  Patient taking differently: Take 500 mg by mouth every 6 (six) hours as needed for mild pain (pain score 1-3).   Deatra Face, MD Taking Active Self  albuterol  (PROVENTIL ) (2.5 MG/3ML) 0.083% nebulizer solution [id number] No Take 3 mLs (2.5 mg total) by nebulization every 6 (six) hours as needed for  wheezing or shortness of breath. Guilloud, Carolyn, MD Taking Active Self  albuterol  (VENTOLIN  HFA) 108 (90 Base) MCG/ACT inhaler [id number] No TAKE 2 PUFFS BY MOUTH EVERY 6 HOURS AS NEEDED FOR WHEEZE OR SHORTNESS OF BREATH Olalere, Adewale A, MD Taking Active Self  atorvastatin  (LIPITOR) 40 MG tablet [id number] No TAKE 1 TABLET BY MOUTH EVERY DAY Lasandra Points, MD Taking Active Self  Azelastine  HCl 137 MCG/SPRAY SOLN [id number] No PLACE 1 SPRAY INTO BOTH NOSTRILS 2 (TWO) TIMES DAILY. USE IN EACH NOSTRIL AS DIRECTED  Patient taking differently: Place 1 spray into both nostrils daily as needed (for allergies).   Lasandra Points, MD Taking Active Self           Med Note (SATTERFIELD, DARIUS E   Fri May 23, 2023  8:27 PM) Still has on hand   Blood Glucose Monitoring Suppl (ACCU-CHEK GUIDE ME) w/Device KIT [id number] No Dispense one device Lasandra Points, MD Taking Active Self  Brexpiprazole  (REXULTI ) 3 MG TABS [id number] No Take 1 tablet (3 mg total) by mouth daily. Arturo Late, MD Taking Active Self  cetirizine  (ZYRTEC ) 10 MG tablet [id number] No Take 1 tablet (10 mg total) by mouth daily.  Patient taking differently: Take 10 mg by mouth every other day.   Guilloud, Carolyn, MD Taking Active Self  cyclobenzaprine  (FLEXERIL ) 10 MG tablet [id number] No Take 10 mg by mouth 3 (three) times daily. [provider] Taking Active Self  dicyclomine  (BENTYL ) 10 MG capsule [id number]  TAKE 2 CAPSULES (20 MG TOTAL) BY MOUTH 3 (THREE) TIMES DAILY AS NEEDED FOR SPASMS (ABDOMINAL PAIN). Edmonia Gottron, PA-C  Active   doxepin  (SINEQUAN ) 25 MG capsule [id number]  Take 1 capsule (25 mg total) by mouth at bedtime. [name], Syed T, MD  Active   doxycycline  (VIBRA -TABS) 100 MG tablet [id number]  Take 1 tablet (100 mg total) by mouth 2 (two) times daily. Margaretann Sharper, MD  Active   ENTRESTO  24-26 MG [id number] No TAKE 1 TABLET BY MOUTH TWICE A DAY Guilloud, Carolyn, MD Taking Active Self  fluticasone   (FLONASE ) 50 MCG/ACT nasal spray [id number] No SPRAY 1 SPRAY INTO EACH NOSTRIL DAILY  Patient taking differently: Place 1 spray into both nostrils daily as needed for allergies.   Guilloud, Carolyn, MD Taking Active Self           Med Note (SATTERFIELD, Ken Patty   Fri May 23, 2023  8:23 PM) Still has on hand   guaiFENesin  (ROBITUSSIN) 100 MG/5ML liquid [id number] No Take 10 mLs by mouth every 6 (six) hours as needed for cough or to loosen phlegm. Vada Garibaldi, MD Taking Active   hydrocortisone  (ANUSOL -HC) 2.5 % rectal cream [id number] No Place 1 Application rectally daily as needed for hemorrhoids. [provider] Taking Active Self           Med Note (SATTERFIELD, DARIUS E  Fri May 23, 2023  8:23 PM) Still has on hand   ibuprofen  (ADVIL ) 800 MG tablet [id number] No Take 800 mg by mouth every 8 (eight) hours as needed for mild pain (pain score 1-3). [provider] Taking Active Self  ipratropium (ATROVENT ) 0.06 % nasal spray [id number] No Place 2 sprays into both nostrils daily as needed for rhinitis. [provider] Taking Active Self           Med Note (SATTERFIELD, DARIUS E   Fri May 23, 2023  8:25 PM) Still has on hand   ipratropium-albuterol  (DUONEB) 0.5-2.5 (3) MG/3ML SOLN [id number] No Take 3 mLs by nebulization 2 (two) times daily. Vada Garibaldi, MD Taking Active   lamoTRIgine  (LAMICTAL ) 100 MG tablet [id number]  Take 0.5 tablets (50 mg total) by mouth 2 (two) times daily. Arturo Late, MD  Active   LINZESS  290 MCG CAPS capsule [id number] No TAKE 1 CAPSULE BY MOUTH DAILY BEFORE BREAKFAST. Edmonia Gottron, PA-C Taking Active Self  megestrol  (MEGACE ) 40 MG tablet [id number] No Take 2 tablets (80 mg total) by mouth daily.  Patient taking differently: Take 80 mg by mouth daily as needed (appetite).   Granville Layer, MD Taking Active Self  metoprolol  tartrate (LOPRESSOR ) 25 MG tablet [id number] No Take 25 mg by mouth daily. [provider] Taking Active Self   omeprazole  (PRILOSEC) 40 MG capsule [id number] No TAKE 1 CAPSULE (40 MG TOTAL) BY MOUTH DAILY. Edmonia Gottron, PA-C Taking Active Self  ondansetron  (ZOFRAN -ODT) 8 MG disintegrating tablet [id number] No Take 1 tablet (8 mg total) by mouth every 8 (eight) hours as needed.  Patient taking differently: Take 8 mg by mouth every 8 (eight) hours as needed for nausea or vomiting.   Driscilla George, MD Taking Active Self  oxyCODONE  (OXY IR/ROXICODONE ) 5 MG immediate release tablet [id number] No Take 5 mg by mouth 2 (two) times daily. [provider] Taking Active Self           Med Note (SATTERFIELD, Lovella Rubin E   Fri May 23, 2023  8:35 PM) Patient verified she is taking every day   predniSONE  (DELTASONE ) 10 MG tablet [id number] No 3 tabs daily for 3 days 2 tabs daily for 3 days 1 tabs daily for 3 days Vada Garibaldi, MD Taking Active   predniSONE  (DELTASONE ) 10 MG tablet [id number]  20 mg daily for 5 days and then 10 mg daily for 5 days Margaretann Sharper, MD  Active   predniSONE  (DELTASONE ) 20 MG tablet [id number]  Take 1 tablet (20 mg total) by mouth daily with breakfast. Myer Artis A, MD  Active   promethazine  (PHENERGAN ) 25 MG tablet [id number]  Take 1 tablet (25 mg total) by mouth every 6 (six) hours as needed for nausea or vomiting. Devorah Fonder  Active   Safety Seal Miscellaneous MISC [id number] No Apply 1 Application topically in the morning. Medication Name; AA Gel Dellar Fenton, DO Taking Active Self  Semaglutide , 2 MG/DOSE, (OZEMPIC , 2 MG/DOSE,) 8 MG/3ML SOPN [id number] No INJECT 2 MG INTO THE SKIN ONCE A WEEK. Guilloud, Carolyn, MD Taking Active Self           Med Note (SATTERFIELD, Ken Patty   Fri May 23, 2023  8:02 PM) Take on Fridays  SYMBICORT  160-4.5 MCG/ACT inhaler [id number] No Inhale 2 puffs into the lungs 2 (two) times daily. [provider] Taking Active Self  torsemide  (DEMADEX ) 20 MG tablet [id number] No TAKE 1 TABLET BY MOUTH  TWICE A Lynda Sands,  MD Taking Active Self            Recommendation:   Specialty provider follow-up Advised to call Pulmonology office to schedule sooner appointment if continue to have shortness of breath or seek emergency medical attention  Follow Up Plan:   Telephone follow up appointment date/time:  5/28 at [facility], RN, MSN, CCM [facility], [facility] RN Care Coordinator Direct Dial: [phone] / Main [phone] Fax [phone] Email: [email or web address]

## 2023-06-25 ENCOUNTER — Ambulatory Visit: Payer: Self-pay | Admitting: Pulmonary Disease

## 2023-06-25 ENCOUNTER — Other Ambulatory Visit: Payer: Self-pay | Admitting: Pulmonary Disease

## 2023-06-25 ENCOUNTER — Encounter (HOSPITAL_COMMUNITY): Payer: Self-pay

## 2023-06-25 ENCOUNTER — Other Ambulatory Visit: Payer: Self-pay

## 2023-06-25 ENCOUNTER — Encounter (HOSPITAL_COMMUNITY): Payer: Self-pay | Admitting: Cardiovascular Disease

## 2023-06-25 ENCOUNTER — Observation Stay (HOSPITAL_COMMUNITY)
Admission: AD | Admit: 2023-06-25 | Discharge: 2023-06-27 | Disposition: A | Discharge status: Home / Self Care | Source: Ambulatory Visit | Attending: Cardiovascular Disease | Admitting: Cardiovascular Disease

## 2023-06-25 ENCOUNTER — Other Ambulatory Visit: Payer: Self-pay | Admitting: *Deleted

## 2023-06-25 ENCOUNTER — Other Ambulatory Visit (HOSPITAL_COMMUNITY): Payer: Self-pay

## 2023-06-25 DIAGNOSIS — E669 Obesity, unspecified: Secondary | ICD-10-CM | POA: Insufficient documentation

## 2023-06-25 DIAGNOSIS — M1990 Unspecified osteoarthritis, unspecified site: Secondary | ICD-10-CM | POA: Diagnosis not present

## 2023-06-25 DIAGNOSIS — Z6841 Body Mass Index (BMI) 40.0 and over, adult: Secondary | ICD-10-CM | POA: Diagnosis not present

## 2023-06-25 DIAGNOSIS — F331 Major depressive disorder, recurrent, moderate: Secondary | ICD-10-CM

## 2023-06-25 DIAGNOSIS — Z87891 Personal history of nicotine dependence: Secondary | ICD-10-CM | POA: Diagnosis not present

## 2023-06-25 DIAGNOSIS — I5042 Chronic combined systolic (congestive) and diastolic (congestive) heart failure: Secondary | ICD-10-CM | POA: Insufficient documentation

## 2023-06-25 DIAGNOSIS — I272 Pulmonary hypertension, unspecified: Principal | ICD-10-CM | POA: Insufficient documentation

## 2023-06-25 DIAGNOSIS — I5032 Chronic diastolic (congestive) heart failure: Secondary | ICD-10-CM | POA: Diagnosis not present

## 2023-06-25 DIAGNOSIS — K589 Irritable bowel syndrome without diarrhea: Secondary | ICD-10-CM | POA: Insufficient documentation

## 2023-06-25 DIAGNOSIS — E1122 Type 2 diabetes mellitus with diabetic chronic kidney disease: Secondary | ICD-10-CM | POA: Insufficient documentation

## 2023-06-25 DIAGNOSIS — R079 Chest pain, unspecified: Secondary | ICD-10-CM | POA: Diagnosis present

## 2023-06-25 DIAGNOSIS — I249 Acute ischemic heart disease, unspecified: Secondary | ICD-10-CM | POA: Diagnosis not present

## 2023-06-25 DIAGNOSIS — J449 Chronic obstructive pulmonary disease, unspecified: Secondary | ICD-10-CM | POA: Insufficient documentation

## 2023-06-25 DIAGNOSIS — I420 Dilated cardiomyopathy: Secondary | ICD-10-CM | POA: Diagnosis not present

## 2023-06-25 DIAGNOSIS — Z8673 Personal history of transient ischemic attack (TIA), and cerebral infarction without residual deficits: Secondary | ICD-10-CM | POA: Insufficient documentation

## 2023-06-25 DIAGNOSIS — N189 Chronic kidney disease, unspecified: Secondary | ICD-10-CM | POA: Diagnosis not present

## 2023-06-25 DIAGNOSIS — I130 Hypertensive heart and chronic kidney disease with heart failure and stage 1 through stage 4 chronic kidney disease, or unspecified chronic kidney disease: Secondary | ICD-10-CM | POA: Diagnosis not present

## 2023-06-25 DIAGNOSIS — D631 Anemia in chronic kidney disease: Secondary | ICD-10-CM | POA: Insufficient documentation

## 2023-06-25 DIAGNOSIS — F4312 Post-traumatic stress disorder, chronic: Secondary | ICD-10-CM

## 2023-06-25 DIAGNOSIS — I2723 Pulmonary hypertension due to lung diseases and hypoxia: Secondary | ICD-10-CM | POA: Diagnosis not present

## 2023-06-25 DIAGNOSIS — Z79899 Other long term (current) drug therapy: Secondary | ICD-10-CM | POA: Insufficient documentation

## 2023-06-25 DIAGNOSIS — R072 Precordial pain: Secondary | ICD-10-CM | POA: Diagnosis not present

## 2023-06-25 LAB — CBC WITH DIFFERENTIAL/PLATELET
Abs Immature Granulocytes: 0.09 K/uL — ABNORMAL HIGH (ref 0.00–0.07)
Basophils Absolute: 0.1 K/uL (ref 0.0–0.1)
Basophils Relative: 1 %
Eosinophils Absolute: 0.1 K/uL (ref 0.0–0.5)
Eosinophils Relative: 1 %
HCT: 38.1 % (ref 36.0–46.0)
Hemoglobin: 11.6 g/dL — ABNORMAL LOW (ref 12.0–15.0)
Immature Granulocytes: 1 %
Lymphocytes Relative: 28 %
Lymphs Abs: 2.6 K/uL (ref 0.7–4.0)
MCH: 26 pg (ref 26.0–34.0)
MCHC: 30.4 g/dL (ref 30.0–36.0)
MCV: 85.4 fL (ref 80.0–100.0)
Monocytes Absolute: 0.8 K/uL (ref 0.1–1.0)
Monocytes Relative: 9 %
Neutro Abs: 5.5 K/uL (ref 1.7–7.7)
Neutrophils Relative %: 60 %
Platelets: 418 K/uL — ABNORMAL HIGH (ref 150–400)
RBC: 4.46 MIL/uL (ref 3.87–5.11)
RDW: 16.7 % — ABNORMAL HIGH (ref 11.5–15.5)
WBC: 9 K/uL (ref 4.0–10.5)
nRBC: 0 % (ref 0.0–0.2)

## 2023-06-25 LAB — COMPREHENSIVE METABOLIC PANEL WITH GFR
ALT: 13 U/L (ref 0–44)
AST: 11 U/L — ABNORMAL LOW (ref 15–41)
Albumin: 3.5 g/dL (ref 3.5–5.0)
Alkaline Phosphatase: 74 U/L (ref 38–126)
Anion gap: 12 (ref 5–15)
BUN: 9 mg/dL (ref 6–20)
CO2: 22 mmol/L (ref 22–32)
Calcium: 9.1 mg/dL (ref 8.9–10.3)
Chloride: 107 mmol/L (ref 98–111)
Creatinine, Ser: 0.9 mg/dL (ref 0.44–1.00)
GFR, Estimated: >60 mL/min
Glucose, Bld: 112 mg/dL — ABNORMAL HIGH (ref 70–99)
Potassium: 4.1 mmol/L (ref 3.5–5.1)
Sodium: 141 mmol/L (ref 135–145)
Total Bilirubin: 0.5 mg/dL (ref 0.0–1.2)
Total Protein: 7 g/dL (ref 6.5–8.1)

## 2023-06-25 LAB — TROPONIN I (HIGH SENSITIVITY)
Troponin I (High Sensitivity): 3 ng/L (ref <18)
Troponin I (High Sensitivity): 7 ng/L (ref <18)

## 2023-06-25 LAB — HEMOGLOBIN A1C
Hgb A1c MFr Bld: 5.5 % (ref 4.8–5.6)
Mean Plasma Glucose: 111.15 mg/dL

## 2023-06-25 LAB — BRAIN NATRIURETIC PEPTIDE: B Natriuretic Peptide: 13.1 pg/mL (ref 0.0–100.0)

## 2023-06-25 LAB — GLUCOSE, CAPILLARY: Glucose-Capillary: 127 mg/dL — ABNORMAL HIGH (ref 70–99)

## 2023-06-25 MED ORDER — ASPIRIN 81 MG PO TBEC: 81.0000 mg | DELAYED_RELEASE_TABLET | Freq: Every day | ORAL | Status: DC

## 2023-06-25 MED ORDER — HEPARIN BOLUS VIA INFUSION
5000.0000 [IU] | Freq: Once | INTRAVENOUS | Status: AC
  Administered 2023-06-25: 5000 [IU] via INTRAVENOUS
  Filled 2023-06-25: qty 5000

## 2023-06-25 MED ORDER — DOXEPIN HCL 25 MG PO CAPS
25.0000 mg | ORAL_CAPSULE | Freq: Every day | ORAL | Status: DC
  Administered 2023-06-26: 25 mg via ORAL
  Filled 2023-06-25 (×3): qty 1

## 2023-06-25 MED ORDER — INSULIN ASPART 100 UNIT/ML IJ SOLN: 0.0000 [IU] | Freq: Three times a day (TID) | INTRAMUSCULAR | Status: DC

## 2023-06-25 MED ORDER — HEPARIN (PORCINE) 25000 UT/250ML-% IV SOLN
1600.0000 [IU]/h | INTRAVENOUS | Status: DC
  Administered 2023-06-25 – 2023-06-26 (×2): 1550 [IU]/h via INTRAVENOUS
  Filled 2023-06-25 (×2): qty 250

## 2023-06-25 MED ORDER — LINACLOTIDE 145 MCG PO CAPS
290.0000 ug | ORAL_CAPSULE | Freq: Every day | ORAL | Status: DC
  Administered 2023-06-27: 290 ug via ORAL
  Filled 2023-06-25 (×2): qty 2

## 2023-06-25 MED ORDER — ALBUTEROL SULFATE (2.5 MG/3ML) 0.083% IN NEBU: 2.5000 mg | INHALATION_SOLUTION | Freq: Four times a day (QID) | RESPIRATORY_TRACT | Status: DC | PRN

## 2023-06-25 MED ORDER — ONDANSETRON HCL 4 MG/2ML IJ SOLN
4.0000 mg | Freq: Four times a day (QID) | INTRAMUSCULAR | Status: DC | PRN
  Administered 2023-06-25: 4 mg via INTRAVENOUS
  Filled 2023-06-25: qty 2

## 2023-06-25 MED ORDER — SODIUM CHLORIDE 0.9% FLUSH: 3.0000 mL | INTRAVENOUS | Status: DC | PRN

## 2023-06-25 MED ORDER — SODIUM CHLORIDE 0.9 % IV SOLN
INTRAVENOUS | Status: DC
Start: 2023-06-25 — End: 2023-06-26

## 2023-06-25 MED ORDER — ASPIRIN 81 MG PO CHEW
81.0000 mg | CHEWABLE_TABLET | ORAL | Status: AC
Start: 2023-06-26 — End: 2023-06-27
  Administered 2023-06-26: 81 mg via ORAL
  Filled 2023-06-25: qty 1

## 2023-06-25 MED ORDER — LAMOTRIGINE 100 MG PO TABS
50.0000 mg | ORAL_TABLET | Freq: Two times a day (BID) | ORAL | Status: DC
  Administered 2023-06-25 – 2023-06-27 (×4): 50 mg via ORAL
  Filled 2023-06-25 (×4): qty 1

## 2023-06-25 MED ORDER — SODIUM CHLORIDE 0.9 % IV SOLN: 250.0000 mL | INTRAVENOUS | Status: DC | PRN

## 2023-06-25 MED ORDER — ALPRAZOLAM 0.25 MG PO TABS: 0.2500 mg | ORAL_TABLET | Freq: Two times a day (BID) | ORAL | Status: DC | PRN

## 2023-06-25 MED ORDER — SACUBITRIL-VALSARTAN 24-26 MG PO TABS
1.0000 | ORAL_TABLET | Freq: Two times a day (BID) | ORAL | Status: DC
  Administered 2023-06-26 – 2023-06-27 (×2): 1 via ORAL
  Filled 2023-06-25 (×3): qty 1

## 2023-06-25 MED ORDER — OXYCODONE HCL 5 MG PO TABS
5.0000 mg | ORAL_TABLET | Freq: Two times a day (BID) | ORAL | Status: DC
  Administered 2023-06-25 – 2023-06-27 (×4): 5 mg via ORAL
  Filled 2023-06-25 (×4): qty 1

## 2023-06-25 MED ORDER — BREXPIPRAZOLE 1 MG PO TABS
3.0000 mg | ORAL_TABLET | Freq: Every day | ORAL | Status: DC
  Filled 2023-06-25 (×3): qty 3

## 2023-06-25 MED ORDER — DOXEPIN HCL 25 MG PO CAPS: 25.0000 mg | ORAL_CAPSULE | Freq: Every day | ORAL | 0 refills | Status: DC

## 2023-06-25 MED ORDER — ACETAMINOPHEN 325 MG PO TABS
650.0000 mg | ORAL_TABLET | ORAL | Status: DC | PRN
  Administered 2023-06-27: 650 mg via ORAL
  Filled 2023-06-25: qty 2

## 2023-06-25 MED ORDER — PREDNISONE 10 MG PO TABS: 10.0000 mg | ORAL_TABLET | Freq: Every day | ORAL | 0 refills | Status: DC

## 2023-06-25 MED ORDER — LAMOTRIGINE 100 MG PO TABS: 50.0000 mg | ORAL_TABLET | Freq: Two times a day (BID) | ORAL | 0 refills | Status: DC

## 2023-06-25 MED ORDER — GUAIFENESIN 100 MG/5ML PO LIQD: 10.0000 mL | Freq: Four times a day (QID) | ORAL | Status: DC | PRN

## 2023-06-25 MED ORDER — ATORVASTATIN CALCIUM 40 MG PO TABS
40.0000 mg | ORAL_TABLET | Freq: Every day | ORAL | Status: DC
  Administered 2023-06-25 – 2023-06-27 (×3): 40 mg via ORAL
  Filled 2023-06-25 (×3): qty 1

## 2023-06-25 MED ORDER — TIZANIDINE HCL 4 MG PO TABS
2.0000 mg | ORAL_TABLET | Freq: Three times a day (TID) | ORAL | Status: DC | PRN
  Administered 2023-06-25 – 2023-06-27 (×2): 2 mg via ORAL
  Filled 2023-06-25 (×2): qty 1

## 2023-06-25 MED ORDER — PREDNISONE 10 MG PO TABS
10.0000 mg | ORAL_TABLET | Freq: Every day | ORAL | Status: DC
  Administered 2023-06-26 – 2023-06-27 (×2): 10 mg via ORAL
  Filled 2023-06-25 (×2): qty 1

## 2023-06-25 MED ORDER — METOPROLOL TARTRATE 25 MG PO TABS
25.0000 mg | ORAL_TABLET | Freq: Every day | ORAL | Status: DC
  Administered 2023-06-25 – 2023-06-26 (×2): 25 mg via ORAL
  Filled 2023-06-25 (×2): qty 1

## 2023-06-25 MED ORDER — PROMETHAZINE HCL 25 MG PO TABS: 25.0000 mg | ORAL_TABLET | Freq: Four times a day (QID) | ORAL | Status: DC | PRN

## 2023-06-25 MED ORDER — SODIUM CHLORIDE 0.9% FLUSH
3.0000 mL | Freq: Two times a day (BID) | INTRAVENOUS | Status: DC
  Administered 2023-06-26 – 2023-06-27 (×3): 3 mL via INTRAVENOUS

## 2023-06-25 MED ORDER — PANTOPRAZOLE SODIUM 40 MG PO TBEC
40.0000 mg | DELAYED_RELEASE_TABLET | Freq: Every day | ORAL | Status: DC
  Administered 2023-06-26 – 2023-06-27 (×2): 40 mg via ORAL
  Filled 2023-06-25 (×2): qty 1

## 2023-06-25 MED ORDER — SODIUM CHLORIDE 0.9 % IV SOLN: INTRAVENOUS | Status: AC

## 2023-06-25 MED ORDER — DICYCLOMINE HCL 10 MG PO CAPS
20.0000 mg | ORAL_CAPSULE | Freq: Three times a day (TID) | ORAL | Status: DC
  Administered 2023-06-25 – 2023-06-27 (×6): 20 mg via ORAL
  Filled 2023-06-25 (×6): qty 2

## 2023-06-25 MED ORDER — ZOLPIDEM TARTRATE 5 MG PO TABS
5.0000 mg | ORAL_TABLET | Freq: Every evening | ORAL | Status: DC | PRN
  Administered 2023-06-25 – 2023-06-26 (×2): 5 mg via ORAL
  Filled 2023-06-25 (×2): qty 1

## 2023-06-25 MED ORDER — NITROGLYCERIN 0.4 MG SL SUBL: 0.4000 mg | SUBLINGUAL_TABLET | SUBLINGUAL | Status: DC | PRN

## 2023-06-25 NOTE — Telephone Encounter (Signed)
 Summary: shortness of breath   Copied From CRM [id number]. Reason for Triage: Holland Lundborg mentions she is patients case manager and she is calling on patients behalf stating the patient is experiencing shortness of breath and feeling under the weather, she also mentions patient was on prednisone  and once the medication ran out patient went back to feeling the same, patient was not with case manager at the time of call, please call patient to speak about other options or booking a sooner appointment.         TRIAGE SUMMARY NOTE: Pt reporting that she has been experiencing worsening SOB even at rest, coughing up yellowish sputum, and getting little sleep due to SOB and trouble with CPAP machine (pt confirms case manager states that DME should be calling pt soon about machine). Pt reporting she finished prednisone  2-3 days ago and per Dr. Gaynell Keeler titrated her oxygen  up to 5L 1-2 weeks ago, but pt symptoms started back up Monday night. Pt confirms she is still at 5L oxygen  continuously, pulse ox went down to 87% with exertion without oxygen , but came back up to 98%. Pt reporting that she's using her albuterol  nebulizer every 6 hours and at the beginning of each treatment she is "gasping for air" but not gasping at the end of each treatment. Pt confirms she is not gasping for air right now and "can get more than one word out" when she does feel that way. Advised pt be examined in ED, pt refusing ED, requesting meds. Advised call 911 if worsening, advised sending HP message to pulm office for call back to pt with further recommendations. Please advise.  E2C2 Pulmonary Triage - Initial Assessment Questions "Chief Complaint (e.g., cough, sob, wheezing, fever, chills, sweat or additional symptoms) *Go to specific symptom protocol after initial questions. Cough sometimes Little bit mucus tinge of yellow No chest pain SOB more than normal even at rest Not gasping for air currently Not really constant, was felt like  was getting better but couple days after completed prednisone  Hard to take deep breathe Can get out more than one word Not been able to really get any sleep, needing help with CPAP machine from [employer] per [facility] they're supposed to reach out to pt Trouble lying flat No dizziness, weakness If dizzy then my vertigo, not dizzy today Wheezing a little tiny bit, not a constant thing, not heard it last night or today  "How long have symptoms been present?" Finished prednisone  2-3 days ago, symptoms came back Monday night or something like that  "Have you used your inhalers/maintenance medication?" Yes If yes, "What medications?" Duoneb nebulizer 2x/day Albuterol  nebulizer every 6 hours Albuterol  inhaler 2-3x/day Think so get relief each time, not gasping for air when come off of it so little bit better, when first start nebulizer gasping for air  OXYGEN : "Do you wear supplemental oxygen ?" Yes If yes, "How many liters are you supposed to use?" 24/7 on 5L, went up on it a week or 2 weeks ago when asked Dr. Gaynell Keeler because asked him if could turn it up and he said yes so been at 5L since then  "Do you monitor your oxygen  levels?" Yes If yes, "What is your reading (oxygen  level) today?" Before oxygen  went to bathroom, 87% with HR 98-99 bpm On oxygen  98% HR 109 because had just gotten back from kitchen  "What is your usual oxygen  saturation reading?"  (Note: Pulmonary O2 sats should be 90% or greater) 89-96%  Reason for Disposition  [1]  MODERATE difficulty breathing (e.g., speaks in phrases, SOB even at rest, pulse 100-120) AND [2] NEW-onset or WORSE than normal  Protocols used: Breathing Difficulty-A-AH

## 2023-06-25 NOTE — H&P (Signed)
 Referring Physician: Tretha Fu, MD/M. Glena Landau, MD  Lindsay [name] is an 53 y.o. female.                       Chief Complaint: Chest pain and shortness of breath  HPI: 53 years old black female with PMH of Asthma, COPD, h/o tobacco use disorder, h/o marijuana smoking, Obesity, Hypertension, stroke, Fibromyalgia, Depression, Sleep apnea, IBS, CKD and arthritis has exertional shortness of breath and chest discomfort. She is awaiting RH cath to r/o pulmonary hypertension also.  Past Medical History:  Diagnosis Date   Acid reflux    Anemia    Iron  Def   Anorexia    CHF (congestive heart failure) (HCC)    Chronic kidney disease    Nephrotic syndrome   Colon polyp 2009   Depression with anxiety    Edema leg    Fibroid tumor 04/2022   Fibromyalgia    Hemorrhoids    Hidradenitis suppurativa    Hypertension    IBS (irritable bowel syndrome)    Low back pain    Migraines    Morbidly obese (HCC)    Neuromuscular disorder (HCC)    fibromyalgia   Neuropathy    Panic attacks    Polyp of vocal cord or larynx    Stroke (HCC)    Tonsil pain       Past Surgical History:  Procedure Laterality Date   AXILLARY HIDRADENITIS EXCISION     COLONOSCOPY WITH PROPOFOL  N/A 05/25/2015   Procedure: COLONOSCOPY WITH PROPOFOL ;  Surgeon: Janel Medford, MD;  Location: [facility];  Service: Endoscopy;  Laterality: N/A;   COLONOSCOPY WITH PROPOFOL  N/A 12/31/2018   Procedure: COLONOSCOPY WITH PROPOFOL ;  Surgeon: Janel Medford, MD;  Location: [facility];  Service: Endoscopy;  Laterality: N/A;   HEMORRHOID SURGERY     with Hidradenitis surgery    INGUINAL HIDRADENITIS EXCISION     TONSILLECTOMY  10/18/2010   by Dr. Melissa Spring   UPPER GASTROINTESTINAL ENDOSCOPY      Family History  Problem Relation Age of Onset   Diabetes Mother    Heart disease Mother        valve leak   Anxiety disorder Mother    Depression Mother    High blood pressure Mother    Kidney failure Mother    Cancer  Father        prostate   Heart disease Father    Learning disabilities Sister    Depression Sister    Depression Sister    Anxiety disorder Sister    Colon cancer Neg Hx    Social History:  reports that she quit smoking about 4 years ago. Her smoking use included cigarettes. She started smoking about 29 years ago. She has a 2.5 pack-year smoking history. She has never used smokeless tobacco. She reports that she does not currently use alcohol. She reports that she does not currently use drugs after having used the following drugs: Marijuana. Frequency: 3.00 times per week.  Allergies:  Allergies  Allergen Reactions   Lisinopril  Rash and Cough    Medications Prior to Admission  Medication Sig Dispense Refill   ACCU-CHEK GUIDE test strip USE ONE TEST STRIP TO CHECK BLOOD SUGARS ONCE A DAY AS NEEDED 100 strip 1   Accu-Chek Softclix Lancets lancets USE ONE LANCET TO CHECK BLOOD SUGAR ONE A DAY AS NEEDED 100 each 1   acetaminophen  (TYLENOL ) 500 MG tablet Take 1 tablet (500 mg total) by mouth  every 6 (six) hours as needed. (Patient taking differently: Take 500 mg by mouth every 6 (six) hours as needed for mild pain (pain score 1-3).) 30 tablet 0   albuterol  (PROVENTIL ) (2.5 MG/3ML) 0.083% nebulizer solution Take 3 mLs (2.5 mg total) by nebulization every 6 (six) hours as needed for wheezing or shortness of breath. 150 mL 0   albuterol  (VENTOLIN  HFA) 108 (90 Base) MCG/ACT inhaler TAKE 2 PUFFS BY MOUTH EVERY 6 HOURS AS NEEDED FOR WHEEZE OR SHORTNESS OF BREATH 8.5 each 0   atorvastatin  (LIPITOR) 40 MG tablet TAKE 1 TABLET BY MOUTH EVERY DAY 90 tablet 3   Azelastine  HCl 137 MCG/SPRAY SOLN PLACE 1 SPRAY INTO BOTH NOSTRILS 2 (TWO) TIMES DAILY. USE IN EACH NOSTRIL AS DIRECTED (Patient taking differently: Place 1 spray into both nostrils daily as needed (for allergies).) 90 mL 4   Blood Glucose Monitoring Suppl (ACCU-CHEK GUIDE ME) w/Device KIT Dispense one device 1 kit 0   Brexpiprazole  (REXULTI ) 3 MG  TABS Take 1 tablet (3 mg total) by mouth daily. 30 tablet 2   cetirizine  (ZYRTEC ) 10 MG tablet Take 1 tablet (10 mg total) by mouth daily. (Patient taking differently: Take 10 mg by mouth every other day.) 90 tablet 1   dicyclomine  (BENTYL ) 10 MG capsule TAKE 2 CAPSULES (20 MG TOTAL) BY MOUTH 3 (THREE) TIMES DAILY AS NEEDED FOR SPASMS (ABDOMINAL PAIN). 60 capsule 1   doxepin  (SINEQUAN ) 25 MG capsule Take 1 capsule (25 mg total) by mouth at bedtime. 30 capsule 0   doxycycline  (VIBRA -TABS) 100 MG tablet Take 1 tablet (100 mg total) by mouth 2 (two) times daily. 20 tablet 0   ENTRESTO  24-26 MG TAKE 1 TABLET BY MOUTH TWICE A DAY 60 tablet 0   fluticasone  (FLONASE ) 50 MCG/ACT nasal spray SPRAY 1 SPRAY INTO EACH NOSTRIL DAILY (Patient taking differently: Place 1 spray into both nostrils daily as needed for allergies.) 32 mL 1   guaiFENesin  (ROBITUSSIN) 100 MG/5ML liquid Take 10 mLs by mouth every 6 (six) hours as needed for cough or to loosen phlegm. 120 mL 0   hydrocortisone  (ANUSOL -HC) 2.5 % rectal cream Place 1 Application rectally daily as needed for hemorrhoids.     ibuprofen  (ADVIL ) 800 MG tablet Take 800 mg by mouth every 8 (eight) hours as needed for mild pain (pain score 1-3).     ipratropium (ATROVENT ) 0.06 % nasal spray Place 2 sprays into both nostrils daily as needed for rhinitis.     ipratropium-albuterol  (DUONEB) 0.5-2.5 (3) MG/3ML SOLN Take 3 mLs by nebulization 2 (two) times daily. 180 mL 0   lamoTRIgine  (LAMICTAL ) 100 MG tablet Take 0.5 tablets (50 mg total) by mouth 2 (two) times daily. 60 tablet 0   LINZESS  290 MCG CAPS capsule TAKE 1 CAPSULE BY MOUTH DAILY BEFORE BREAKFAST. 90 capsule 0   megestrol  (MEGACE ) 40 MG tablet Take 2 tablets (80 mg total) by mouth daily. (Patient taking differently: Take 80 mg by mouth daily as needed (appetite).) 180 tablet 1   metoprolol  tartrate (LOPRESSOR ) 25 MG tablet Take 25 mg by mouth daily.     omeprazole  (PRILOSEC) 40 MG capsule TAKE 1 CAPSULE (40  MG TOTAL) BY MOUTH DAILY. 90 capsule 0   ondansetron  (ZOFRAN -ODT) 8 MG disintegrating tablet Take 1 tablet (8 mg total) by mouth every 8 (eight) hours as needed. (Patient taking differently: Take 8 mg by mouth every 8 (eight) hours as needed for nausea or vomiting.) 90 tablet 0   oxyCODONE  (OXY IR/ROXICODONE )  5 MG immediate release tablet Take 5 mg by mouth 2 (two) times daily.     predniSONE  (DELTASONE ) 10 MG tablet 3 tabs daily for 3 days 2 tabs daily for 3 days 1 tabs daily for 3 days 18 tablet 0   predniSONE  (DELTASONE ) 10 MG tablet 20 mg daily for 5 days and then 10 mg daily for 5 days 15 tablet 0   predniSONE  (DELTASONE ) 20 MG tablet Take 1 tablet (20 mg total) by mouth daily with breakfast. 7 tablet 0   promethazine  (PHENERGAN ) 25 MG tablet Take 1 tablet (25 mg total) by mouth every 6 (six) hours as needed for nausea or vomiting. 120 tablet 0   Safety Seal Miscellaneous MISC Apply 1 Application topically in the morning. Medication Name; AA Gel 30 g 6   Semaglutide , 2 MG/DOSE, (OZEMPIC , 2 MG/DOSE,) 8 MG/3ML SOPN INJECT 2 MG INTO THE SKIN ONCE A WEEK. 9 mL 1   SYMBICORT  160-4.5 MCG/ACT inhaler Inhale 2 puffs into the lungs 2 (two) times daily.     torsemide  (DEMADEX ) 20 MG tablet TAKE 1 TABLET BY MOUTH TWICE A DAY 180 tablet 1    No results found. However, due to the size of the patient record, not all encounters were searched. Please check Results Review for a complete set of results. No results found.  Review Of Systems Constitutional: No fever, chills, Chronic weight gain. Eyes: No vision change, wears glasses. No discharge or pain. Ears: No hearing loss, No tinnitus. Respiratory: Positive asthma, COPD, pneumonias, shortness of breath. No hemoptysis. Cardiovascular: Positive chest pain, palpitation, leg edema. Gastrointestinal: Positive nausea, vomiting, constipation. No GI bleed. No hepatitis. Genitourinary: No dysuria, hematuria, kidney stone. No incontinance. Neurological: Positive  headache, stroke, no seizures.  Psychiatry: No psych facility admission for anxiety, depression, suicide. No detox. Skin: No rash. Musculoskeletal: Positive joint pain, fibromyalgia. No neck pain, back pain. Lymphadenopathy: No lymphadenopathy. Hematology: H/O anemia, no easy bruising.   Blood pressure (!) 151/96, pulse 98, temperature 98.2 F (36.8 C), temperature source Oral, resp. rate 18, weight (!) 145.4 kg, SpO2 97%. Body mass index is 44.71 kg/m. General appearance: alert, cooperative, appears stated age and no distress Head: Normocephalic, atraumatic. Eyes: Brown eyes, pink conjunctiva, corneas clear. Neck: No adenopathy, no carotid bruit, no JVD, supple, symmetrical, trachea midline and thyroid  not enlarged. Resp: Clearing to auscultation bilaterally. Cardio: Regular rate and rhythm, S1, S2 normal, II/VI systolic murmur, no click, rub or gallop GI: Soft, non-tender; bowel sounds normal; no organomegaly. Extremities: Trace edema, no cyanosis or clubbing. Skin: Warm and dry.  Neurologic: Alert and oriented X 3, normal strength. Normal coordination and slow gait.  Assessment/Plan Acute coronary syndrome COPD Asthma HTN Obesity Type 2 DM Arthritis IBS Anemia of chronic disease Possible pulmonary HTN  Plan: R + L heart cath in AM. IV heparin. Resume Home meds.  Time spent: Review of old records, Lab, x-rays, EKG, other cardiac tests, examination, discussion with patient/Family over 70 minutes.  Darrold Emms, MD  06/25/2023, 5:31 PM

## 2023-06-25 NOTE — Progress Notes (Signed)
 PHARMACY - ANTICOAGULATION CONSULT NOTE  Pharmacy Consult for heparin Indication: chest pain/ACS  Allergies  Allergen Reactions   Lisinopril  Rash and Cough    Patient Measurements:   06/25/23 wt 320 lbs 83.8 oz  Heparin dosing wt: 106 kg  Vital Signs: Temp: 98.2 F (36.8 C) (05/28 1657) Temp Source: Oral (05/28 1657) BP: 151/96 (05/28 1657) Pulse Rate: 98 (05/28 1657)  Labs: No results for input(s): "HGB", "HCT", "PLT", "APTT", "LABPROT", "INR", "HEPARINUNFRC", "HEPRLOWMOCWT", "CREATININE", "CKTOTAL", "CKMB", "TROPONINIHS" in the last 72 hours.  CrCl cannot be calculated (Patient's most recent lab result is older than the maximum 21 days allowed.).   Medical History: Past Medical History:  Diagnosis Date   Acid reflux    Anemia    Iron  Def   Anorexia    CHF (congestive heart failure) (HCC)    Chronic kidney disease    Nephrotic syndrome   Colon polyp 2009   Depression with anxiety    Edema leg    Fibroid tumor 04/2022   Fibromyalgia    Hemorrhoids    Hidradenitis suppurativa    Hypertension    IBS (irritable bowel syndrome)    Low back pain    Migraines    Morbidly obese (HCC)    Neuromuscular disorder (HCC)    fibromyalgia   Neuropathy    Panic attacks    Polyp of vocal cord or larynx    Stroke (HCC)    Tonsil pain       Assessment: 53 yo W with persistent SOB with suspected ACS event. No anticoagulation prior to admission per chart review and available fill hx. Pharmacy consulted for heparin.    Goal of Therapy:  Heparin level 0.3-0.7 units/ml Monitor platelets by anticoagulation protocol: Yes   Plan:  Heparin 5000 unit bolus then 1550 units/hr F/u 8hr heparin level  Monitor daily heparin level, CBC, signs/symptoms of bleeding    Dorene Gang, PharmD, BCPS, [facility] Clinical Pharmacist  Please check AMION for all [facility] Pharmacy phone numbers After 10:00 PM, call Main Pharmacy [phone]

## 2023-06-25 NOTE — Patient Outreach (Signed)
 Complex Care Management   Visit Note  06/25/2023  Name:  Lindsay [name] MRN: [id number] DOB: 01-15-1971  Situation: Referral received for Complex Care Management related to Heart Failure and COPD I obtained verbal consent from Patient.  Visit completed with Patient  on the phone  Patient still having some shortness of breath, she will wait for call from pulmonology team.  She is concerned about needing clearance from cardiology and pulmonology to have her colonoscopy done next month, she will discuss further with them during call.   Background:   Past Medical History:  Diagnosis Date   Acid reflux    Anemia    Iron  Def   Anorexia    CHF (congestive heart failure) (HCC)    Chronic kidney disease    Nephrotic syndrome   Colon polyp 2009   Depression with anxiety    Edema leg    Fibroid tumor 04/2022   Fibromyalgia    Hemorrhoids    Hidradenitis suppurativa    Hypertension    IBS (irritable bowel syndrome)    Low back pain    Migraines    Morbidly obese (HCC)    Neuromuscular disorder (HCC)    fibromyalgia   Neuropathy    Panic attacks    Polyp of vocal cord or larynx    Stroke (HCC)    Tonsil pain     Assessment: Patient Reported Symptoms:  Cognitive Cognitive Status: Alert and oriented to person, place, and time, Normal speech and language skills, Able to follow simple commands Cognitive/Intellectual Conditions Management [RPT]: None reported or documented in medical history or problem list      Neurological Neurological Review of Symptoms: No symptoms reported    HEENT HEENT Symptoms Reported: No symptoms reported      Cardiovascular Cardiovascular Symptoms Reported: No symptoms reported    Respiratory Respiratory Symptoms Reported: Shortness of breath Additional Respiratory Details: Felt better when on prednisone , worse now that steroid course is complete Respiratory Conditions: COPD  Endocrine Patient reports the following symptoms related to hypoglycemia or  hyperglycemia : No symptoms reported    Gastrointestinal Gastrointestinal Symptoms Reported: No symptoms reported      Genitourinary Genitourinary Symptoms Reported: No symptoms reported    Integumentary Integumentary Symptoms Reported: No symptoms reported    Musculoskeletal Musculoskelatal Symptoms Reviewed: No symptoms reported        Psychosocial Psychosocial Symptoms Reported: No symptoms reported            05/28/2023    1:32 PM  Depression screen PHQ 2/9  Decreased Interest 1  Down, Depressed, Hopeless 1  PHQ - 2 Score 2  Altered sleeping 2  Tired, decreased energy 2  Change in appetite 1  Feeling bad or failure about yourself  0  Trouble concentrating 1  Moving slowly or fidgety/restless 1  Suicidal thoughts 0  PHQ-9 Score 9  Difficult doing work/chores Somewhat difficult    There were no vitals filed for this visit.  Medications Reviewed Today     Reviewed by Holland Lundborg, RN (Registered Nurse) on 06/25/23 at 1330  Med List Status: <None>   Medication Order Taking? Sig Documenting Provider Last Dose Status Informant  ACCU-CHEK GUIDE test strip [id number] No USE ONE TEST STRIP TO CHECK BLOOD SUGARS ONCE A DAY AS NEEDED Lasandra Points, MD Taking Active Self  Accu-Chek Softclix Lancets lancets [id number] No USE ONE LANCET TO CHECK BLOOD SUGAR ONE A DAY AS NEEDED Lasandra Points, MD Taking Active Self  acetaminophen  (TYLENOL ) 500 MG tablet  [id number] No Take 1 tablet (500 mg total) by mouth every 6 (six) hours as needed.  Patient taking differently: Take 500 mg by mouth every 6 (six) hours as needed for mild pain (pain score 1-3).   Deatra Face, MD Taking Active Self  albuterol  (PROVENTIL ) (2.5 MG/3ML) 0.083% nebulizer solution [id number] No Take 3 mLs (2.5 mg total) by nebulization every 6 (six) hours as needed for wheezing or shortness of breath. Guilloud, Carolyn, MD Taking Active Self  albuterol  (VENTOLIN  HFA) 108 (90 Base) MCG/ACT inhaler [id number]  No TAKE 2 PUFFS BY MOUTH EVERY 6 HOURS AS NEEDED FOR WHEEZE OR SHORTNESS OF BREATH Olalere, Adewale A, MD Taking Active Self  atorvastatin  (LIPITOR) 40 MG tablet [id number] No TAKE 1 TABLET BY MOUTH EVERY DAY Lasandra Points, MD Taking Active Self  Azelastine  HCl 137 MCG/SPRAY SOLN [id number] No PLACE 1 SPRAY INTO BOTH NOSTRILS 2 (TWO) TIMES DAILY. USE IN EACH NOSTRIL AS DIRECTED  Patient taking differently: Place 1 spray into both nostrils daily as needed (for allergies).   Lasandra Points, MD Taking Active Self           Med Note (SATTERFIELD, DARIUS E   Fri May 23, 2023  8:27 PM) Still has on hand   Blood Glucose Monitoring Suppl (ACCU-CHEK GUIDE ME) w/Device KIT [id number] No Dispense one device Lasandra Points, MD Taking Active Self  Brexpiprazole  (REXULTI ) 3 MG TABS [id number] No Take 1 tablet (3 mg total) by mouth daily. [name], Syed T, MD Taking Active Self  cetirizine  (ZYRTEC ) 10 MG tablet [id number] No Take 1 tablet (10 mg total) by mouth daily.  Patient taking differently: Take 10 mg by mouth every other day.   Guilloud, Carolyn, MD Taking Active Self  dicyclomine  (BENTYL ) 10 MG capsule [id number]  TAKE 2 CAPSULES (20 MG TOTAL) BY MOUTH 3 (THREE) TIMES DAILY AS NEEDED FOR SPASMS (ABDOMINAL PAIN). Edmonia Gottron, PA-C  Active   doxepin  (SINEQUAN ) 25 MG capsule [id number]  Take 1 capsule (25 mg total) by mouth at bedtime. [name], Syed T, MD  Active   doxycycline  (VIBRA -TABS) 100 MG tablet [id number]  Take 1 tablet (100 mg total) by mouth 2 (two) times daily. Margaretann Sharper, MD  Active   ENTRESTO  24-26 MG [id number] No TAKE 1 TABLET BY MOUTH TWICE A DAY Guilloud, Carolyn, MD Taking Active Self  fluticasone  (FLONASE ) 50 MCG/ACT nasal spray [id number] No SPRAY 1 SPRAY INTO EACH NOSTRIL DAILY  Patient taking differently: Place 1 spray into both nostrils daily as needed for allergies.   Guilloud, Carolyn, MD Taking Active Self           Med Note (SATTERFIELD, Ken Patty   Fri May 23, 2023   8:23 PM) Still has on hand   guaiFENesin  (ROBITUSSIN) 100 MG/5ML liquid [id number] No Take 10 mLs by mouth every 6 (six) hours as needed for cough or to loosen phlegm. Vada Garibaldi, MD Taking Active   hydrocortisone  (ANUSOL -HC) 2.5 % rectal cream [id number] No Place 1 Application rectally daily as needed for hemorrhoids. [provider] Taking Active Self           Med Note (SATTERFIELD, DARIUS E   Fri May 23, 2023  8:23 PM) Still has on hand   ibuprofen  (ADVIL ) 800 MG tablet [id number] No Take 800 mg by mouth every 8 (eight) hours as needed for mild pain (pain score 1-3). [provider] Taking Active Self  ipratropium (ATROVENT ) 0.06 % nasal spray [id number] No Place 2 sprays into both nostrils daily  as needed for rhinitis. [provider] Taking Active Self           Med Note (SATTERFIELD, DARIUS E   Fri May 23, 2023  8:25 PM) Still has on hand   ipratropium-albuterol  (DUONEB) 0.5-2.5 (3) MG/3ML SOLN [id number] No Take 3 mLs by nebulization 2 (two) times daily. Vada Garibaldi, MD Taking Expired 06/24/23 2359   lamoTRIgine  (LAMICTAL ) 100 MG tablet [id number]  Take 0.5 tablets (50 mg total) by mouth 2 (two) times daily. Arturo Late, MD  Active   LINZESS  290 MCG CAPS capsule [id number] No TAKE 1 CAPSULE BY MOUTH DAILY BEFORE BREAKFAST. Edmonia Gottron, PA-C Taking Active Self  megestrol  (MEGACE ) 40 MG tablet [id number] No Take 2 tablets (80 mg total) by mouth daily.  Patient taking differently: Take 80 mg by mouth daily as needed (appetite).   Granville Layer, MD Taking Active Self  metoprolol  tartrate (LOPRESSOR ) 25 MG tablet [id number] No Take 25 mg by mouth daily. [provider] Taking Active Self  omeprazole  (PRILOSEC) 40 MG capsule [id number] No TAKE 1 CAPSULE (40 MG TOTAL) BY MOUTH DAILY. Edmonia Gottron, PA-C Taking Active Self  ondansetron  (ZOFRAN -ODT) 8 MG disintegrating tablet [id number] No Take 1 tablet (8 mg total) by mouth every 8 (eight) hours as  needed.  Patient taking differently: Take 8 mg by mouth every 8 (eight) hours as needed for nausea or vomiting.   Driscilla George, MD Taking Active Self  oxyCODONE  (OXY IR/ROXICODONE ) 5 MG immediate release tablet [id number] No Take 5 mg by mouth 2 (two) times daily. [provider] Taking Active Self           Med Note (SATTERFIELD, Lovella Rubin E   Fri May 23, 2023  8:35 PM) Patient verified she is taking every day   predniSONE  (DELTASONE ) 10 MG tablet [id number] No 3 tabs daily for 3 days 2 tabs daily for 3 days 1 tabs daily for 3 days Vada Garibaldi, MD Taking Active   predniSONE  (DELTASONE ) 10 MG tablet [id number]  20 mg daily for 5 days and then 10 mg daily for 5 days Margaretann Sharper, MD  Active   predniSONE  (DELTASONE ) 20 MG tablet [id number]  Take 1 tablet (20 mg total) by mouth daily with breakfast. Myer Artis A, MD  Active   promethazine  (PHENERGAN ) 25 MG tablet [id number]  Take 1 tablet (25 mg total) by mouth every 6 (six) hours as needed for nausea or vomiting. Devorah Fonder  Active   Safety Seal Miscellaneous MISC [id number] No Apply 1 Application topically in the morning. Medication Name; AA Gel Dellar Fenton, DO Taking Active Self  Semaglutide , 2 MG/DOSE, (OZEMPIC , 2 MG/DOSE,) 8 MG/3ML SOPN [id number] No INJECT 2 MG INTO THE SKIN ONCE A WEEK. Guilloud, Carolyn, MD Taking Active Self           Med Note (SATTERFIELD, Ken Patty   Fri May 23, 2023  8:02 PM) Take on Fridays  SYMBICORT  160-4.5 MCG/ACT inhaler [id number] No Inhale 2 puffs into the lungs 2 (two) times daily. [provider] Taking Active Self  torsemide  (DEMADEX ) 20 MG tablet [id number] No TAKE 1 TABLET BY MOUTH TWICE A DAY Guilloud, Carolyn, MD Taking Active Self            Recommendation:   Specialty provider follow-up pulmonology 6/11  Follow Up Plan:   Telephone follow up appointment date/time:  6/4 at 10am with P. Slusher  Holland Lundborg, RN, MSN, CCM [place]  [facility], [facility] RN Care Coordinator Direct Dial: [phone] / Main [phone] Fax [phone] Email: [email or web address]

## 2023-06-25 NOTE — Patient Instructions (Signed)
 Visit Information  Thank you for taking time to visit with me today. Please don't hesitate to contact me if I can be of assistance to you before our next scheduled appointment.  Telephone follow up appointment date/time:  6/4 at 10am with Floreen Hunger.  Please call the care guide team at [phone] if you need to cancel, schedule, or reschedule an appointment.   Please call the Suicide and Crisis Lifeline: 988 call the [place]  National Suicide Prevention Lifeline: (318) 101-1752 or TTY: 431-516-6927 TTY 515-547-3565) to talk to a trained counselor call 1-800-273-TALK (toll free, 24 hour hotline) go to [facility] [address], [place] [phone]) call 911 if you are experiencing a Mental Health or Behavioral Health Crisis or need someone to talk to.  Holland Lundborg, RN, MSN, CCM [facility], [facility] RN Care Coordinator Direct Dial: [phone] / Main [phone] Fax [phone] Email: [email or web address]

## 2023-06-26 ENCOUNTER — Encounter (HOSPITAL_COMMUNITY): Payer: Self-pay | Admitting: Cardiovascular Disease

## 2023-06-26 ENCOUNTER — Telehealth (HOSPITAL_COMMUNITY): Payer: 59 | Admitting: Psychiatry

## 2023-06-26 ENCOUNTER — Encounter (HOSPITAL_COMMUNITY)
Admission: AD | Disposition: A | Discharge status: Home / Self Care | Payer: Self-pay | Source: Ambulatory Visit | Attending: Cardiovascular Disease

## 2023-06-26 ENCOUNTER — Ambulatory Visit (HOSPITAL_COMMUNITY): Admission: RE | Admit: 2023-06-26 | Source: Home / Self Care | Admitting: Cardiovascular Disease

## 2023-06-26 DIAGNOSIS — D631 Anemia in chronic kidney disease: Secondary | ICD-10-CM | POA: Diagnosis not present

## 2023-06-26 DIAGNOSIS — R072 Precordial pain: Secondary | ICD-10-CM | POA: Diagnosis not present

## 2023-06-26 DIAGNOSIS — Z79899 Other long term (current) drug therapy: Secondary | ICD-10-CM | POA: Diagnosis not present

## 2023-06-26 DIAGNOSIS — Z87891 Personal history of nicotine dependence: Secondary | ICD-10-CM | POA: Diagnosis not present

## 2023-06-26 DIAGNOSIS — Z8673 Personal history of transient ischemic attack (TIA), and cerebral infarction without residual deficits: Secondary | ICD-10-CM | POA: Diagnosis not present

## 2023-06-26 DIAGNOSIS — M1990 Unspecified osteoarthritis, unspecified site: Secondary | ICD-10-CM | POA: Diagnosis not present

## 2023-06-26 DIAGNOSIS — N189 Chronic kidney disease, unspecified: Secondary | ICD-10-CM | POA: Diagnosis not present

## 2023-06-26 DIAGNOSIS — I5042 Chronic combined systolic (congestive) and diastolic (congestive) heart failure: Secondary | ICD-10-CM | POA: Diagnosis not present

## 2023-06-26 DIAGNOSIS — J449 Chronic obstructive pulmonary disease, unspecified: Secondary | ICD-10-CM | POA: Diagnosis not present

## 2023-06-26 DIAGNOSIS — I2723 Pulmonary hypertension due to lung diseases and hypoxia: Secondary | ICD-10-CM | POA: Diagnosis not present

## 2023-06-26 DIAGNOSIS — I420 Dilated cardiomyopathy: Secondary | ICD-10-CM | POA: Diagnosis not present

## 2023-06-26 DIAGNOSIS — I272 Pulmonary hypertension, unspecified: Secondary | ICD-10-CM | POA: Diagnosis not present

## 2023-06-26 DIAGNOSIS — I130 Hypertensive heart and chronic kidney disease with heart failure and stage 1 through stage 4 chronic kidney disease, or unspecified chronic kidney disease: Secondary | ICD-10-CM | POA: Diagnosis not present

## 2023-06-26 DIAGNOSIS — I249 Acute ischemic heart disease, unspecified: Secondary | ICD-10-CM | POA: Diagnosis not present

## 2023-06-26 DIAGNOSIS — I5032 Chronic diastolic (congestive) heart failure: Secondary | ICD-10-CM | POA: Diagnosis not present

## 2023-06-26 DIAGNOSIS — K589 Irritable bowel syndrome without diarrhea: Secondary | ICD-10-CM | POA: Diagnosis not present

## 2023-06-26 DIAGNOSIS — E1122 Type 2 diabetes mellitus with diabetic chronic kidney disease: Secondary | ICD-10-CM | POA: Diagnosis not present

## 2023-06-26 HISTORY — PX: RIGHT/LEFT HEART CATH AND CORONARY ANGIOGRAPHY: CATH118266

## 2023-06-26 LAB — POCT I-STAT 7, (LYTES, BLD GAS, ICA,H+H)
Acid-base deficit: 3 mmol/L — ABNORMAL HIGH (ref 0.0–2.0)
Bicarbonate: 22.6 mmol/L (ref 20.0–28.0)
Calcium, Ion: 1.23 mmol/L (ref 1.15–1.40)
HCT: 35 % — ABNORMAL LOW (ref 36.0–46.0)
Hemoglobin: 11.9 g/dL — ABNORMAL LOW (ref 12.0–15.0)
O2 Saturation: 95 %
Potassium: 4.4 mmol/L (ref 3.5–5.1)
Sodium: 137 mmol/L (ref 135–145)
TCO2: 24 mmol/L (ref 22–32)
pCO2 arterial: 39.7 mmHg (ref 32–48)
pH, Arterial: 7.362 (ref 7.35–7.45)
pO2, Arterial: 77 mmHg — ABNORMAL LOW (ref 83–108)

## 2023-06-26 LAB — POCT I-STAT EG7
Acid-base deficit: 2 mmol/L (ref 0.0–2.0)
Acid-base deficit: 2 mmol/L (ref 0.0–2.0)
Bicarbonate: 24.1 mmol/L (ref 20.0–28.0)
Bicarbonate: 24.1 mmol/L (ref 20.0–28.0)
Calcium, Ion: 1.24 mmol/L (ref 1.15–1.40)
Calcium, Ion: 1.25 mmol/L (ref 1.15–1.40)
HCT: 35 % — ABNORMAL LOW (ref 36.0–46.0)
HCT: 35 % — ABNORMAL LOW (ref 36.0–46.0)
Hemoglobin: 11.9 g/dL — ABNORMAL LOW (ref 12.0–15.0)
Hemoglobin: 11.9 g/dL — ABNORMAL LOW (ref 12.0–15.0)
O2 Saturation: 66 %
O2 Saturation: 69 %
Potassium: 4.4 mmol/L (ref 3.5–5.1)
Potassium: 4.5 mmol/L (ref 3.5–5.1)
Sodium: 137 mmol/L (ref 135–145)
Sodium: 137 mmol/L (ref 135–145)
TCO2: 25 mmol/L (ref 22–32)
TCO2: 25 mmol/L (ref 22–32)
pCO2, Ven: 44.1 mmHg (ref 44–60)
pCO2, Ven: 44.1 mmHg (ref 44–60)
pH, Ven: 7.345 (ref 7.25–7.43)
pH, Ven: 7.346 (ref 7.25–7.43)
pO2, Ven: 36 mmHg (ref 32–45)
pO2, Ven: 38 mmHg (ref 32–45)

## 2023-06-26 LAB — GLUCOSE, CAPILLARY
Glucose-Capillary: 99 mg/dL (ref 70–99)
Glucose-Capillary: 104 mg/dL — ABNORMAL HIGH (ref 70–99)
Glucose-Capillary: 140 mg/dL — ABNORMAL HIGH (ref 70–99)

## 2023-06-26 LAB — BASIC METABOLIC PANEL WITH GFR
Anion gap: 6 (ref 5–15)
BUN: 12 mg/dL (ref 6–20)
CO2: 24 mmol/L (ref 22–32)
Calcium: 8.4 mg/dL — ABNORMAL LOW (ref 8.9–10.3)
Chloride: 107 mmol/L (ref 98–111)
Creatinine, Ser: 1.03 mg/dL — ABNORMAL HIGH (ref 0.44–1.00)
GFR, Estimated: >60 mL/min (ref >60)
Glucose, Bld: 107 mg/dL — ABNORMAL HIGH (ref 70–99)
Potassium: 3.8 mmol/L (ref 3.5–5.1)
Sodium: 137 mmol/L (ref 135–145)

## 2023-06-26 LAB — CBC
HCT: 35.5 % — ABNORMAL LOW (ref 36.0–46.0)
Hemoglobin: 11 g/dL — ABNORMAL LOW (ref 12.0–15.0)
MCH: 26.5 pg (ref 26.0–34.0)
MCHC: 31 g/dL (ref 30.0–36.0)
MCV: 85.5 fL (ref 80.0–100.0)
Platelets: 402 10*3/uL — ABNORMAL HIGH (ref 150–400)
RBC: 4.15 MIL/uL (ref 3.87–5.11)
RDW: 16.7 % — ABNORMAL HIGH (ref 11.5–15.5)
WBC: 11.1 10*3/uL — ABNORMAL HIGH (ref 4.0–10.5)
nRBC: 0 % (ref 0.0–0.2)

## 2023-06-26 LAB — LIPID PANEL
Cholesterol: 133 mg/dL (ref 0–200)
HDL: 66 mg/dL (ref >40)
LDL Cholesterol: 53 mg/dL (ref 0–99)
Total CHOL/HDL Ratio: 2 ratio
Triglycerides: 70 mg/dL (ref <150)
VLDL: 14 mg/dL (ref 0–40)

## 2023-06-26 LAB — HEPARIN LEVEL (UNFRACTIONATED): Heparin Unfractionated: 0.38 [IU]/mL (ref 0.30–0.70)

## 2023-06-26 SURGERY — RIGHT/LEFT HEART CATH AND CORONARY ANGIOGRAPHY
Anesthesia: LOCAL

## 2023-06-26 MED ORDER — METOPROLOL TARTRATE 50 MG PO TABS
50.0000 mg | ORAL_TABLET | Freq: Two times a day (BID) | ORAL | Status: DC
  Administered 2023-06-26 – 2023-06-27 (×2): 50 mg via ORAL
  Filled 2023-06-26 (×2): qty 1

## 2023-06-26 MED ORDER — HYDRALAZINE HCL 20 MG/ML IJ SOLN: 10.0000 mg | INTRAMUSCULAR | Status: AC | PRN

## 2023-06-26 MED ORDER — FERROUS SULFATE 325 (65 FE) MG PO TABS
325.0000 mg | ORAL_TABLET | Freq: Every day | ORAL | Status: DC
  Administered 2023-06-27: 325 mg via ORAL
  Filled 2023-06-26: qty 1

## 2023-06-26 MED ORDER — VERAPAMIL HCL 2.5 MG/ML IV SOLN
INTRAVENOUS | Status: AC
  Filled 2023-06-26: qty 2

## 2023-06-26 MED ORDER — ONDANSETRON HCL 4 MG/2ML IJ SOLN
4.0000 mg | Freq: Four times a day (QID) | INTRAMUSCULAR | Status: DC | PRN
  Administered 2023-06-26 – 2023-06-27 (×2): 4 mg via INTRAVENOUS
  Filled 2023-06-26 (×2): qty 2

## 2023-06-26 MED ORDER — MOMETASONE FURO-FORMOTEROL FUM 200-5 MCG/ACT IN AERO
2.0000 | INHALATION_SPRAY | Freq: Two times a day (BID) | RESPIRATORY_TRACT | Status: DC
  Administered 2023-06-26 – 2023-06-27 (×2): 2 via RESPIRATORY_TRACT
  Filled 2023-06-26: qty 8.8

## 2023-06-26 MED ORDER — LIDOCAINE HCL (PF) 1 % IJ SOLN
INTRAMUSCULAR | Status: AC
  Filled 2023-06-26: qty 30

## 2023-06-26 MED ORDER — HEPARIN (PORCINE) IN NACL 1000-0.9 UT/500ML-% IV SOLN
INTRAVENOUS | Status: DC | PRN
  Administered 2023-06-26 (×2): 500 mL

## 2023-06-26 MED ORDER — LABETALOL HCL 5 MG/ML IV SOLN: 10.0000 mg | INTRAVENOUS | Status: AC | PRN

## 2023-06-26 MED ORDER — HEPARIN SODIUM (PORCINE) 1000 UNIT/ML IJ SOLN
INTRAMUSCULAR | Status: AC
  Filled 2023-06-26: qty 10

## 2023-06-26 MED ORDER — MIDAZOLAM HCL 2 MG/2ML IJ SOLN
INTRAMUSCULAR | Status: AC
  Filled 2023-06-26: qty 2

## 2023-06-26 MED ORDER — MEGESTROL ACETATE 40 MG PO TABS
40.0000 mg | ORAL_TABLET | Freq: Every day | ORAL | Status: DC
  Administered 2023-06-27: 40 mg via ORAL
  Filled 2023-06-26 (×2): qty 1

## 2023-06-26 MED ORDER — LIDOCAINE HCL (PF) 1 % IJ SOLN
INTRAMUSCULAR | Status: DC | PRN
  Administered 2023-06-26 (×2): 5 mL

## 2023-06-26 MED ORDER — VERAPAMIL HCL 2.5 MG/ML IV SOLN
INTRAVENOUS | Status: DC | PRN
  Administered 2023-06-26: 10 mL via INTRA_ARTERIAL

## 2023-06-26 MED ORDER — HEPARIN SODIUM (PORCINE) 1000 UNIT/ML IJ SOLN
INTRAMUSCULAR | Status: DC | PRN
  Administered 2023-06-26: 6000 [IU] via INTRAVENOUS

## 2023-06-26 MED ORDER — FENTANYL CITRATE (PF) 100 MCG/2ML IJ SOLN
INTRAMUSCULAR | Status: DC | PRN
  Administered 2023-06-26: 25 ug via INTRAVENOUS
  Administered 2023-06-26: 50 ug via INTRAVENOUS

## 2023-06-26 MED ORDER — SODIUM CHLORIDE 0.9% FLUSH
3.0000 mL | Freq: Two times a day (BID) | INTRAVENOUS | Status: DC
  Administered 2023-06-26 – 2023-06-27 (×3): 3 mL via INTRAVENOUS

## 2023-06-26 MED ORDER — OXYCODONE HCL 5 MG PO TABS
5.0000 mg | ORAL_TABLET | Freq: Once | ORAL | Status: AC
  Administered 2023-06-26: 5 mg via ORAL
  Filled 2023-06-26: qty 1

## 2023-06-26 MED ORDER — MECLIZINE HCL 25 MG PO TABS
25.0000 mg | ORAL_TABLET | Freq: Three times a day (TID) | ORAL | Status: DC | PRN
  Administered 2023-06-26: 25 mg via ORAL
  Filled 2023-06-26: qty 1

## 2023-06-26 MED ORDER — IOHEXOL 350 MG/ML SOLN
INTRAVENOUS | Status: DC | PRN
  Administered 2023-06-26: 80 mL

## 2023-06-26 MED ORDER — SODIUM CHLORIDE 0.9 % IV SOLN: 250.0000 mL | INTRAVENOUS | Status: DC | PRN

## 2023-06-26 MED ORDER — SODIUM CHLORIDE 0.9% FLUSH
3.0000 mL | INTRAVENOUS | Status: DC | PRN
Start: 2023-06-26 — End: 2023-06-27

## 2023-06-26 MED ORDER — FENTANYL CITRATE (PF) 100 MCG/2ML IJ SOLN
INTRAMUSCULAR | Status: AC
Start: 2023-06-26 — End: ?
  Filled 2023-06-26: qty 2

## 2023-06-26 MED ORDER — MIDAZOLAM HCL 2 MG/2ML IJ SOLN
INTRAMUSCULAR | Status: DC | PRN
  Administered 2023-06-26: 1 mg via INTRAVENOUS

## 2023-06-26 SURGICAL SUPPLY — 15 items
CATH 5FR JL3.5 JR4 ANG PIG MP (CATHETERS) IMPLANT
CATH BALLN WEDGE 5F 110CM (CATHETERS) IMPLANT
CATH SWAN GANZ 7F STRAIGHT (CATHETERS) IMPLANT
DEVICE RAD COMP TR BAND LRG (VASCULAR PRODUCTS) IMPLANT
GLIDESHEATH SLEND SS 6F .021 (SHEATH) IMPLANT
GLIDESHEATH SLENDER 7FR .021G (SHEATH) IMPLANT
GUIDEWIRE .025 260CM (WIRE) IMPLANT
GUIDEWIRE INQWIRE 1.5J.035X260 (WIRE) IMPLANT
KIT HEART LEFT (KITS) IMPLANT
PACK CARDIAC CATHETERIZATION (CUSTOM PROCEDURE TRAY) ×1 IMPLANT
SHEATH GLIDE SLENDER 4/5FR (SHEATH) IMPLANT
SHEATH PROBE COVER 6X72 (BAG) IMPLANT
TRANSDUCER W/STOPCOCK (MISCELLANEOUS) IMPLANT
TUBING ART PRESS 72 MALE/FEM (TUBING) IMPLANT
WIRE EMERALD 3MM-J .025X260CM (WIRE) IMPLANT

## 2023-06-26 NOTE — Progress Notes (Signed)
 Ref: Tretha Fu, MD   Subjective:  Awake.  Pulmonary f/u recommended right heart catheterization. Chest and shortness of breath with HTN, Obesity and significant past tobacco use she will benefit for coronary angiography also.  Objective:  Vital Signs in the last 24 hours: Temp:  [98.2 F (36.8 C)-98.6 F (37 C)] 98.4 F (36.9 C) (05/29 0824) Pulse Rate:  [81-98] 81 (05/29 0824) Cardiac Rhythm: Normal sinus rhythm (05/29 0713) Resp:  [18] 18 (05/29 0824) BP: (120-151)/(75-100) 128/89 (05/29 0824) SpO2:  [97 %-100 %] 100 % (05/29 0824) Weight:  [145.4 kg] 145.4 kg (05/28 1700)  Physical Exam: BP Readings from Last 1 Encounters:  06/26/23 128/89     Wt Readings from Last 1 Encounters:  06/25/23 (!) 145.4 kg    Weight change:  Body mass index is 44.71 kg/m. HEENT: [place]/AT, Eyes-Brown, Conjunctiva-Pink, Sclera-Non-icteric Neck: No JVD, No bruit, Trachea midline. Lungs:  Clearing, Bilateral. Cardiac:  Regular rhythm, normal S1 and S2, no S3. II/VI systolic murmur. Abdomen:  Soft, non-tender. BS present. Extremities:  Trace edema present. No cyanosis. No clubbing. CNS: AxOx3, Cranial nerves grossly intact, moves all 4 extremities.  Skin: Warm and dry.   Intake/Output from previous day: 05/28 0701 - 05/29 0700 In: 1063.5 [P.O.:480; I.V.:583.5] Out: -     Lab Results: BMET    Component Value Date/Time   NA 137 06/26/2023 0436   NA 141 06/25/2023 1806   NA 138 05/25/2023 0434   NA 142 08/07/2022 1024   NA 143 01/09/2022 1020   NA 142 07/18/2021 0934   K 3.8 06/26/2023 0436   K 4.1 06/25/2023 1806   K 3.9 05/25/2023 0434   CL 107 06/26/2023 0436   CL 107 06/25/2023 1806   CL 100 05/25/2023 0434   CO2 24 06/26/2023 0436   CO2 22 06/25/2023 1806   CO2 27 05/25/2023 0434   GLUCOSE 107 (H) 06/26/2023 0436   GLUCOSE 112 (H) 06/25/2023 1806   GLUCOSE 199 (H) 05/25/2023 0434   BUN 12 06/26/2023 0436   BUN 9 06/25/2023 1806   BUN 12 05/25/2023 0434   BUN 12  08/07/2022 1024   BUN 8 01/09/2022 1020   BUN 10 07/18/2021 0934   CREATININE 1.03 (H) 06/26/2023 0436   CREATININE 0.90 06/25/2023 1806   CREATININE 0.95 05/25/2023 0434   CREATININE 0.88 09/15/2013 1032   CREATININE 0.86 04/05/2011 1403   CALCIUM  8.4 (L) 06/26/2023 0436   CALCIUM  9.1 06/25/2023 1806   CALCIUM  8.6 (L) 05/25/2023 0434   GFRNONAA >60 06/26/2023 0436   GFRNONAA >60 06/25/2023 1806   GFRNONAA >60 05/25/2023 0434   GFRAA 67 02/02/2020 0933   GFRAA 59 (L) 01/10/2020 1012   GFRAA 81 12/31/2019 1027   CBC    Component Value Date/Time   WBC 11.1 (H) 06/26/2023 0436   RBC 4.15 06/26/2023 0436   HGB 11.0 (L) 06/26/2023 0436   HGB 11.4 08/07/2022 1024   HCT 35.5 (L) 06/26/2023 0436   HCT 34.5 08/07/2022 1024   PLT 402 (H) 06/26/2023 0436   PLT 421 08/07/2022 1024   MCV 85.5 06/26/2023 0436   MCV 83 08/07/2022 1024   MCH 26.5 06/26/2023 0436   MCHC 31.0 06/26/2023 0436   RDW 16.7 (H) 06/26/2023 0436   RDW 14.3 08/07/2022 1024   LYMPHSABS 2.6 06/25/2023 1806   LYMPHSABS 2.0 08/07/2022 1024   MONOABS 0.8 06/25/2023 1806   EOSABS 0.1 06/25/2023 1806   EOSABS 0.2 08/07/2022 1024   BASOSABS 0.1 06/25/2023  1806   BASOSABS 0.0 08/07/2022 1024   HEPATIC Function Panel Recent Labs    05/05/23 0856 05/23/23 0853 06/25/23 1806  PROT 7.6 6.9 7.0  ALBUMIN  4.5 3.6 3.5  AST 9 13* 11*  ALT 9 11 13   ALKPHOS 68 71 74  BILIDIR 0.1  --   --    HEMOGLOBIN A1C Lab Results  Component Value Date   MPG 111.15 06/25/2023   CARDIAC ENZYMES Lab Results  Component Value Date   TROPONINI <0.03 06/24/2018   BNP No results for input(s): "PROBNP" in the last 8760 hours. TSH Recent Labs    12/03/22 1014 01/14/23 0549  TSH 0.10* 0.422   CHOLESTEROL Recent Labs    06/26/23 0436  CHOL 133    Scheduled Meds:  atorvastatin   40 mg Oral Daily   brexpiprazole   3 mg Oral Daily   dicyclomine   20 mg Oral TID AC & HS   doxepin   25 mg Oral QHS   [START ON 06/27/2023]  ferrous sulfate   325 mg Oral Q breakfast   insulin  aspart  0-9 Units Subcutaneous TID WC   lamoTRIgine   50 mg Oral BID   linaclotide   290 mcg Oral QAC breakfast   megestrol   40 mg Oral Daily   metoprolol  tartrate  50 mg Oral BID   mometasone -formoterol   2 puff Inhalation BID   oxyCODONE   5 mg Oral BID   pantoprazole   40 mg Oral Daily   predniSONE   10 mg Oral Q breakfast   sacubitril -valsartan   1 tablet Oral BID   sodium chloride  flush  3 mL Intravenous Q12H   Continuous Infusions:  sodium chloride  40 mL/hr at 06/25/23 1817   sodium chloride      sodium chloride  40 mL/hr at 06/26/23 0431   heparin 1,600 Units/hr (06/26/23 0643)   PRN Meds:.sodium chloride , acetaminophen , albuterol , ALPRAZolam, guaiFENesin , nitroGLYCERIN, promethazine , sodium chloride  flush, tiZANidine , zolpidem  Assessment/Plan: Acute coronary syndrome COPD Asthma HTN Obesity Type 2 DM Arthritis IBS Anemia of chronic disease Possible pulmonary HTN  Plan: R + L heart catheterization.   LOS: 0 days   Time spent including chart review, lab review, examination, discussion with patient :  min   Pasqual Bone  MD  06/26/2023, 10:21 AM

## 2023-06-26 NOTE — Progress Notes (Signed)
 PHARMACY - ANTICOAGULATION CONSULT NOTE  Pharmacy Consult for heparin  Indication: chest pain/ACS  Allergies  Allergen Reactions   Lisinopril  Rash and Cough    Patient Measurements: Weight: (!) 145.4 kg (320 lb 8.8 oz) 06/25/23 wt 320 lbs 83.8 oz  Heparin  dosing wt: 106 kg  Vital Signs: Temp: 98.4 F (36.9 C) (05/29 0453) Temp Source: Oral (05/29 0453) BP: 129/88 (05/29 0453)  Labs: Recent Labs    06/25/23 1806 06/25/23 1951 06/26/23 0436  HGB 11.6*  --  11.0*  HCT 38.1  --  35.5*  PLT 418*  --  402*  HEPARINUNFRC  --   --  0.38  CREATININE 0.90  --   --   TROPONINIHS 3 7  --     Estimated Creatinine Clearance: 114.8 mL/min (by C-G formula based on SCr of 0.9 mg/dL).   Medical History: Past Medical History:  Diagnosis Date   Acid reflux    Anemia    Iron  Def   Anorexia    CHF (congestive heart failure) (HCC)    Chronic kidney disease    Nephrotic syndrome   Colon polyp 2009   Depression with anxiety    Edema leg    Fibroid tumor 04/2022   Fibromyalgia    Hemorrhoids    Hidradenitis suppurativa    Hypertension    IBS (irritable bowel syndrome)    Low back pain    Migraines    Morbidly obese (HCC)    Neuromuscular disorder (HCC)    fibromyalgia   Neuropathy    Panic attacks    Polyp of vocal cord or larynx    Stroke (HCC)    Tonsil pain       Assessment: 53 yo W with persistent SOB with suspected ACS event. No anticoagulation prior to admission per chart review and available fill hx. Pharmacy consulted for heparin .    Heparin  level 0.38 - at low end of therapeutic range. No issues with the infusion or bleeding reported per RN. Hgb stable at 11, Plts 401.  Goal of Therapy:  Heparin  level 0.3-0.7 units/ml Monitor platelets by anticoagulation protocol: Yes   Plan:  Increase heparin  infusion to 1600 units/hr F/u 6hr heparin  level  Monitor daily heparin  level, CBC, signs/symptoms of bleeding    Armanda Bern, PharmD, BCPS Clinical  Pharmacist  Please check AMION for all [facility] Pharmacy phone numbers After 10:00 PM, call Main Pharmacy [phone]

## 2023-06-26 NOTE — Plan of Care (Signed)
  Problem: Education: Goal: Knowledge of General Education information will improve Description: Including pain rating scale, medication(s)/side effects and non-pharmacologic comfort measures Outcome: Progressing   Problem: Health Behavior/Discharge Planning: Goal: Ability to manage health-related needs will improve Outcome: Progressing   Problem: Activity: Goal: Risk for activity intolerance will decrease Outcome: Progressing   Problem: Nutrition: Goal: Adequate nutrition will be maintained Outcome: Progressing   Problem: Coping: Goal: Level of anxiety will decrease Outcome: Progressing   Problem: Elimination: Goal: Will not experience complications related to bowel motility Outcome: Progressing Goal: Will not experience complications related to urinary retention Outcome: Progressing   Problem: Pain Managment: Goal: General experience of comfort will improve and/or be controlled Outcome: Progressing   Problem: Safety: Goal: Ability to remain free from injury will improve Outcome: Progressing   Problem: Skin Integrity: Goal: Risk for impaired skin integrity will decrease Outcome: Progressing   Problem: Education: Goal: Understanding of cardiac disease, CV risk reduction, and recovery process will improve Outcome: Progressing Goal: Individualized Educational Video(s) Outcome: Progressing   Problem: Activity: Goal: Ability to tolerate increased activity will improve Outcome: Progressing   Problem: Cardiac: Goal: Ability to achieve and maintain adequate cardiovascular perfusion will improve Outcome: Progressing   Problem: Health Behavior/Discharge Planning: Goal: Ability to safely manage health-related needs after discharge will improve Outcome: Progressing   Problem: Education: Goal: Ability to describe self-care measures that may prevent or decrease complications (Diabetes Survival Skills Education) will improve Outcome: Progressing Goal: Individualized  Educational Video(s) Outcome: Progressing   Problem: Coping: Goal: Ability to adjust to condition or change in health will improve Outcome: Progressing   Problem: Fluid Volume: Goal: Ability to maintain a balanced intake and output will improve Outcome: Progressing   Problem: Health Behavior/Discharge Planning: Goal: Ability to identify and utilize available resources and services will improve Outcome: Progressing Goal: Ability to manage health-related needs will improve Outcome: Progressing   Problem: Metabolic: Goal: Ability to maintain appropriate glucose levels will improve Outcome: Progressing   Problem: Nutritional: Goal: Maintenance of adequate nutrition will improve Outcome: Progressing Goal: Progress toward achieving an optimal weight will improve Outcome: Progressing   Problem: Skin Integrity: Goal: Risk for impaired skin integrity will decrease Outcome: Progressing   Problem: Tissue Perfusion: Goal: Adequacy of tissue perfusion will improve Outcome: Progressing   Problem: Education: Goal: Understanding of CV disease, CV risk reduction, and recovery process will improve Outcome: Progressing Goal: Individualized Educational Video(s) Outcome: Progressing   Problem: Activity: Goal: Ability to return to baseline activity level will improve Outcome: Progressing   Problem: Cardiovascular: Goal: Ability to achieve and maintain adequate cardiovascular perfusion will improve Outcome: Progressing Goal: Vascular access site(s) Level 0-1 will be maintained Outcome: Progressing   Problem: Health Behavior/Discharge Planning: Goal: Ability to safely manage health-related needs after discharge will improve Outcome: Progressing

## 2023-06-27 DIAGNOSIS — J449 Chronic obstructive pulmonary disease, unspecified: Secondary | ICD-10-CM | POA: Diagnosis not present

## 2023-06-27 DIAGNOSIS — R072 Precordial pain: Secondary | ICD-10-CM | POA: Diagnosis not present

## 2023-06-27 DIAGNOSIS — Z8673 Personal history of transient ischemic attack (TIA), and cerebral infarction without residual deficits: Secondary | ICD-10-CM | POA: Diagnosis not present

## 2023-06-27 DIAGNOSIS — E669 Obesity, unspecified: Secondary | ICD-10-CM | POA: Diagnosis not present

## 2023-06-27 DIAGNOSIS — E1122 Type 2 diabetes mellitus with diabetic chronic kidney disease: Secondary | ICD-10-CM | POA: Diagnosis not present

## 2023-06-27 DIAGNOSIS — Z79899 Other long term (current) drug therapy: Secondary | ICD-10-CM | POA: Diagnosis not present

## 2023-06-27 DIAGNOSIS — I272 Pulmonary hypertension, unspecified: Secondary | ICD-10-CM | POA: Diagnosis not present

## 2023-06-27 DIAGNOSIS — I420 Dilated cardiomyopathy: Secondary | ICD-10-CM | POA: Diagnosis not present

## 2023-06-27 DIAGNOSIS — K589 Irritable bowel syndrome without diarrhea: Secondary | ICD-10-CM | POA: Diagnosis not present

## 2023-06-27 DIAGNOSIS — M1990 Unspecified osteoarthritis, unspecified site: Secondary | ICD-10-CM | POA: Diagnosis not present

## 2023-06-27 DIAGNOSIS — Z87891 Personal history of nicotine dependence: Secondary | ICD-10-CM | POA: Diagnosis not present

## 2023-06-27 DIAGNOSIS — I5032 Chronic diastolic (congestive) heart failure: Secondary | ICD-10-CM | POA: Diagnosis not present

## 2023-06-27 DIAGNOSIS — N189 Chronic kidney disease, unspecified: Secondary | ICD-10-CM | POA: Diagnosis not present

## 2023-06-27 DIAGNOSIS — I2723 Pulmonary hypertension due to lung diseases and hypoxia: Secondary | ICD-10-CM | POA: Diagnosis not present

## 2023-06-27 DIAGNOSIS — I249 Acute ischemic heart disease, unspecified: Secondary | ICD-10-CM | POA: Diagnosis not present

## 2023-06-27 DIAGNOSIS — I5042 Chronic combined systolic (congestive) and diastolic (congestive) heart failure: Secondary | ICD-10-CM | POA: Diagnosis not present

## 2023-06-27 DIAGNOSIS — I130 Hypertensive heart and chronic kidney disease with heart failure and stage 1 through stage 4 chronic kidney disease, or unspecified chronic kidney disease: Secondary | ICD-10-CM | POA: Diagnosis not present

## 2023-06-27 DIAGNOSIS — D631 Anemia in chronic kidney disease: Secondary | ICD-10-CM | POA: Diagnosis not present

## 2023-06-27 LAB — CBC
HCT: 35 % — ABNORMAL LOW (ref 36.0–46.0)
Hemoglobin: 10.9 g/dL — ABNORMAL LOW (ref 12.0–15.0)
MCH: 26.6 pg (ref 26.0–34.0)
MCHC: 31.1 g/dL (ref 30.0–36.0)
MCV: 85.4 fL (ref 80.0–100.0)
Platelets: 394 10*3/uL (ref 150–400)
RBC: 4.1 MIL/uL (ref 3.87–5.11)
RDW: 16.7 % — ABNORMAL HIGH (ref 11.5–15.5)
WBC: 10.2 10*3/uL (ref 4.0–10.5)
nRBC: 0 % (ref 0.0–0.2)

## 2023-06-27 LAB — GLUCOSE, CAPILLARY
Glucose-Capillary: 130 mg/dL — ABNORMAL HIGH (ref 70–99)
Glucose-Capillary: 147 mg/dL — ABNORMAL HIGH (ref 70–99)
Glucose-Capillary: 115 mg/dL — ABNORMAL HIGH (ref 70–99)

## 2023-06-27 LAB — LIPOPROTEIN A (LPA): Lipoprotein (a): 273.7 nmol/L — ABNORMAL HIGH (ref <75.0)

## 2023-06-27 MED ORDER — SUMATRIPTAN SUCCINATE 25 MG PO TABS
25.0000 mg | ORAL_TABLET | Freq: Once | ORAL | Status: AC
  Administered 2023-06-27: 25 mg via ORAL
  Filled 2023-06-27 (×2): qty 1

## 2023-06-27 MED ORDER — METOPROLOL SUCCINATE ER 100 MG PO TB24
100.0000 mg | ORAL_TABLET | Freq: Every day | ORAL | 0 refills | Status: DC
Start: 2023-06-27 — End: 2023-10-10

## 2023-06-27 MED ORDER — METOPROLOL SUCCINATE ER 100 MG PO TB24: 100.0000 mg | ORAL_TABLET | Freq: Every day | ORAL | Status: DC

## 2023-06-27 MED ORDER — FERROUS SULFATE 325 (65 FE) MG PO TABS
325.0000 mg | ORAL_TABLET | Freq: Every day | ORAL | 3 refills | Status: DC
Start: 2023-06-28 — End: 2023-09-01

## 2023-06-27 MED ORDER — SUMATRIPTAN SUCCINATE 25 MG PO TABS: 25.0000 mg | ORAL_TABLET | Freq: Once | ORAL | 0 refills | Status: AC

## 2023-06-27 MED ORDER — DILTIAZEM HCL ER COATED BEADS 120 MG PO CP24
120.0000 mg | ORAL_CAPSULE | Freq: Every day | ORAL | Status: DC
  Administered 2023-06-27: 120 mg via ORAL
  Filled 2023-06-27: qty 1

## 2023-06-27 MED ORDER — DILTIAZEM HCL ER COATED BEADS 120 MG PO CP24: 120.0000 mg | ORAL_CAPSULE | Freq: Every day | ORAL | 6 refills | Status: AC

## 2023-06-27 NOTE — Plan of Care (Signed)
 Patient is discharged to home. PIV removed. All personal belongings returned. DC instruction provided.   Problem: Education: Goal: Knowledge of General Education information will improve Description: Including pain rating scale, medication(s)/side effects and non-pharmacologic comfort measures Outcome: Completed/Met   Problem: Health Behavior/Discharge Planning: Goal: Ability to manage health-related needs will improve Outcome: Completed/Met   Problem: Clinical Measurements: Goal: Ability to maintain clinical measurements within normal limits will improve Outcome: Completed/Met Goal: Will remain free from infection Outcome: Completed/Met Goal: Diagnostic test results will improve Outcome: Completed/Met Goal: Respiratory complications will improve Outcome: Completed/Met Goal: Cardiovascular complication will be avoided Outcome: Completed/Met   Problem: Activity: Goal: Risk for activity intolerance will decrease Outcome: Completed/Met   Problem: Nutrition: Goal: Adequate nutrition will be maintained Outcome: Completed/Met   Problem: Coping: Goal: Level of anxiety will decrease Outcome: Completed/Met   Problem: Elimination: Goal: Will not experience complications related to bowel motility Outcome: Completed/Met Goal: Will not experience complications related to urinary retention Outcome: Completed/Met   Problem: Pain Managment: Goal: General experience of comfort will improve and/or be controlled Outcome: Completed/Met   Problem: Safety: Goal: Ability to remain free from injury will improve Outcome: Completed/Met   Problem: Skin Integrity: Goal: Risk for impaired skin integrity will decrease Outcome: Completed/Met   Problem: Education: Goal: Understanding of cardiac disease, CV risk reduction, and recovery process will improve Outcome: Completed/Met Goal: Individualized Educational Video(s) Outcome: Completed/Met   Problem: Activity: Goal: Ability to tolerate  increased activity will improve Outcome: Completed/Met   Problem: Cardiac: Goal: Ability to achieve and maintain adequate cardiovascular perfusion will improve Outcome: Completed/Met   Problem: Health Behavior/Discharge Planning: Goal: Ability to safely manage health-related needs after discharge will improve Outcome: Completed/Met   Problem: Education: Goal: Ability to describe self-care measures that may prevent or decrease complications (Diabetes Survival Skills Education) will improve Outcome: Completed/Met Goal: Individualized Educational Video(s) Outcome: Completed/Met   Problem: Coping: Goal: Ability to adjust to condition or change in health will improve Outcome: Completed/Met   Problem: Fluid Volume: Goal: Ability to maintain a balanced intake and output will improve Outcome: Completed/Met   Problem: Health Behavior/Discharge Planning: Goal: Ability to identify and utilize available resources and services will improve Outcome: Completed/Met Goal: Ability to manage health-related needs will improve Outcome: Completed/Met   Problem: Metabolic: Goal: Ability to maintain appropriate glucose levels will improve Outcome: Completed/Met   Problem: Nutritional: Goal: Maintenance of adequate nutrition will improve Outcome: Completed/Met Goal: Progress toward achieving an optimal weight will improve Outcome: Completed/Met   Problem: Skin Integrity: Goal: Risk for impaired skin integrity will decrease Outcome: Completed/Met   Problem: Tissue Perfusion: Goal: Adequacy of tissue perfusion will improve Outcome: Completed/Met   Problem: Education: Goal: Understanding of CV disease, CV risk reduction, and recovery process will improve Outcome: Completed/Met Goal: Individualized Educational Video(s) Outcome: Completed/Met   Problem: Activity: Goal: Ability to return to baseline activity level will improve Outcome: Completed/Met   Problem: Cardiovascular: Goal:  Ability to achieve and maintain adequate cardiovascular perfusion will improve Outcome: Completed/Met Goal: Vascular access site(s) Level 0-1 will be maintained Outcome: Completed/Met   Problem: Health Behavior/Discharge Planning: Goal: Ability to safely manage health-related needs after discharge will improve Outcome: Completed/Met

## 2023-06-27 NOTE — Plan of Care (Signed)
   Problem: Education: Goal: Knowledge of General Education information will improve Description Including pain rating scale, medication(s)/side effects and non-pharmacologic comfort measures Outcome: Progressing

## 2023-06-27 NOTE — Discharge Summary (Signed)
 Physician Discharge Summary  Patient ID: Analeigh Aries MRN: [id number] DOB/AGE: 1970/12/12 53 y.o.  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  She underwent right and left heart cath showing normal coronaries and mild pulmonary hypertension with dilated LV and RV. A low dose diltiazem was added and instructions were for heart healthy diet, activity to lose weight and do breathing exercises to improve oxygenation and lung function. Small dose Entresto  can be retried with diuretic, beta-blocker and atorvastatin . Her right radial artery cath site and right brachial vein access site were stable. She will see me in 1 week, Dr. Glena Landau in 2 weeks and Primary care Dr. Sherlene Diss in 1 month.  Consults: cardiology  Significant Diagnostic Studies: labs: CBC is near normal with mild anemia BMET was near normal with mild hyperglycemia. Lipid panel was very good with LDl cholesterol of 53 mg. And HDL of 66 mg. Triglycerides was 70 mg. Lipoprotein A was elevated at 273 nmol/L HgbA1C was 5.5%. BNP was 13.1 pg.and Troponin I was normal.  EKG was normal.  CXR was unremarkable 1 month ago.  Echocardiogram showed mild LV and RV dilatation with mild hypokinesia 1 month  ago.  Cardiac cath. Showed normal coronaries with mildly elevated LVEDP, RVEDP, RA and Wedge pressures with mild pulmonary HTN  Treatments: cardiac meds: Atorvastatin , Entresto , metoprolol , diltiazem, and Torsemide .  Discharge Exam: Blood pressure 108/71, pulse (!) 106, temperature 98.5 F (36.9 C), temperature source Oral, resp. rate 18, height 5\' 11"  (1.803 m), weight (!) 145.4 kg, SpO2 97%. General appearance: alert, cooperative and appears stated age. Head: Normocephalic, atraumatic. Eyes: Brown eyes, pink conjunctiva, corneas clear. .  Neck: No adenopathy, no carotid bruit, no JVD, supple, symmetrical, trachea midline and thyroid  not enlarged. Resp: Clear to auscultation bilaterally. Cardio: Regular rate and rhythm, S1, S2 normal, II/VI systolic murmur, no click, rub or gallop. GI: Soft, non-tender; bowel sounds normal; no organomegaly. Extremities: No edema, cyanosis or clubbing.Right radial cath site and right elbow venous accesssite were stable. Skin: Warm and dry.  Neurologic: Alert and oriented X 3, normal strength and tone. Normal coordination and slow gait.  Disposition: Discharge disposition: 01-Home or Self Care        Allergies as of 06/27/2023       Reactions   Lisinopril  Rash, Cough        Medication List     STOP taking these medications    doxycycline  100 MG tablet Commonly known as: VIBRA -TABS   ibuprofen  800 MG tablet Commonly known as: ADVIL    metoprolol  tartrate 25 MG tablet Commonly known as: LOPRESSOR    predniSONE  10 MG tablet Commonly known as: DELTASONE    predniSONE  20 MG tablet Commonly known as: DELTASONE        TAKE these medications    Accu-Chek Guide Me w/Device Kit Dispense one device   Accu-Chek Guide test strip Generic drug: glucose blood USE ONE TEST STRIP TO  CHECK BLOOD SUGARS ONCE A DAY AS NEEDED   Accu-Chek Softclix Lancets lancets USE ONE LANCET TO CHECK BLOOD SUGAR ONE A DAY AS NEEDED   acetaminophen  500 MG  tablet Commonly known as: TYLENOL  Take 1 tablet (500 mg total) by mouth every 6 (six) hours as needed. What changed: reasons to take this   albuterol  108 (90 Base) MCG/ACT inhaler Commonly known as: VENTOLIN  HFA TAKE 2 PUFFS BY MOUTH EVERY 6 HOURS AS NEEDED FOR WHEEZE OR SHORTNESS OF BREATH   albuterol  (2.5 MG/3ML) 0.083% nebulizer solution Commonly known as: PROVENTIL  Take 3 mLs (2.5 mg total) by nebulization every 6 (six) hours as needed for wheezing or shortness of breath.   atorvastatin  40 MG tablet Commonly known as: LIPITOR TAKE 1 TABLET BY MOUTH EVERY DAY   Azelastine  HCl 137 MCG/SPRAY Soln PLACE 1 SPRAY INTO BOTH NOSTRILS 2 (TWO) TIMES DAILY. USE IN EACH NOSTRIL AS DIRECTED What changed: See the new instructions.   cetirizine  10 MG tablet Commonly known as: ZYRTEC  Take 1 tablet (10 mg total) by mouth daily.   dicyclomine  10 MG capsule Commonly known as: BENTYL  TAKE 2 CAPSULES (20 MG TOTAL) BY MOUTH 3 (THREE) TIMES DAILY AS NEEDED FOR SPASMS (ABDOMINAL PAIN). What changed: See the new instructions.   diltiazem  120 MG 24 hr capsule Commonly known as: CARDIZEM  CD Take 1 capsule (120 mg total) by mouth daily.   doxepin  25 MG capsule Commonly known as: SINEQUAN  Take 1 capsule (25 mg total) by mouth at bedtime.   Entresto  24-26 MG Generic drug: sacubitril -valsartan  TAKE 1 TABLET BY MOUTH TWICE A DAY   ferrous sulfate  325 (65 FE) MG tablet Take 1 tablet (325 mg total) by mouth daily with breakfast. Start taking on: Jun 28, 2023   fluticasone  50 MCG/ACT nasal spray Commonly known as: FLONASE  SPRAY 1 SPRAY INTO EACH NOSTRIL DAILY What changed: See the new instructions.   guaiFENesin  100 MG/5ML liquid Commonly known as: ROBITUSSIN Take 10 mLs by mouth every 6 (six) hours as needed for cough or to loosen phlegm.   hydrocortisone  2.5 % rectal cream Commonly known as: ANUSOL -HC Place 1 Application rectally daily as needed for hemorrhoids.   ipratropium 0.06 %  nasal spray Commonly known as: ATROVENT  Place 2 sprays into both nostrils daily as needed for rhinitis.   ipratropium-albuterol  0.5-2.5 (3) MG/3ML Soln Commonly known as: DUONEB Take 3 mLs by nebulization 2 (two) times daily.   lamoTRIgine  100 MG tablet Commonly known as: LaMICtal  Take 0.5 tablets (50 mg total) by mouth 2 (two) times daily.   Linzess  290 MCG Caps capsule Generic drug: linaclotide  TAKE 1 CAPSULE BY MOUTH DAILY BEFORE BREAKFAST.   meclizine  25 MG tablet Commonly known as: ANTIVERT  Take 25 mg by mouth 3 (three) times daily.   megestrol  40 MG tablet Commonly known as: MEGACE  Take 2 tablets (80 mg total) by mouth daily. What changed:  when to take this reasons to take this   metoprolol  succinate 100 MG 24 hr tablet Commonly known as: TOPROL -XL Take 1 tablet (100 mg total) by mouth at bedtime. What changed: when to take this   omeprazole  40 MG capsule Commonly known as: PRILOSEC TAKE 1 CAPSULE (40 MG TOTAL) BY MOUTH DAILY.   ondansetron  8 MG disintegrating tablet Commonly known as: ZOFRAN -ODT Take 1 tablet (8 mg total) by mouth every 8 (eight) hours as needed. What changed: reasons to take this   oxyCODONE  5 MG immediate release tablet Commonly known as: Oxy IR/ROXICODONE  Take 5 mg by  mouth 2 (two) times daily.   Ozempic  (2 MG/DOSE) 8 MG/3ML Sopn Generic drug: Semaglutide  (2 MG/DOSE) INJECT 2 MG INTO THE SKIN ONCE A WEEK.   promethazine  25 MG tablet Commonly known as: PHENERGAN  Take 1 tablet (25 mg total) by mouth every 6 (six) hours as needed for nausea or vomiting.   Rexulti  3 MG Tabs Generic drug: Brexpiprazole  Take 1 tablet (3 mg total) by mouth daily.   Safety Seal Miscellaneous Misc Apply 1 Application topically in the morning. Medication Name; AA Gel   SUMAtriptan  25 MG tablet Commonly known as: IMITREX  Take 1 tablet (25 mg total) by mouth once for 1 dose. May repeat in 2 hours if headache persists or recurs.   Symbicort  160-4.5  MCG/ACT inhaler Generic drug: budesonide -formoterol  Inhale 2 puffs into the lungs 2 (two) times daily.   tiZANidine  4 MG tablet Commonly known as: ZANAFLEX  Take 4 mg by mouth every 8 (eight) hours as needed for muscle spasms.   torsemide  20 MG tablet Commonly known as: DEMADEX  TAKE 1 TABLET BY MOUTH TWICE A DAY        Follow-up Information     Osei-Bonsu, George, MD Follow up in 1 month(s).   Specialty: Internal Medicine Contact information: [address] [place] [address] [phone]         Pasqual Bone, MD Follow up in 1 week(s).   Specialty: Cardiology Contact information: [address] [place] [address] [phone]         Chapman Commodore, MD Follow up in 2 week(s).   Specialty: Cardiology Contact information: [address] [place] [address] [phone]                 Time spent: Review of old chart, current chart, lab, x-ray, cardiac tests and discussion with patient over 60 minutes.  Signed: Darrold Emms 06/27/2023, 10:26 AM

## 2023-06-27 NOTE — Telephone Encounter (Signed)
**Note De-identified  [place] Obfuscation** Please advise 

## 2023-06-27 NOTE — Progress Notes (Signed)
 Patient verbalized understanding of instructions. All belongings and paperwork given to patient.

## 2023-06-30 ENCOUNTER — Telehealth: Payer: Self-pay

## 2023-06-30 NOTE — Transitions of Care (Post Inpatient/ED Visit) (Signed)
   06/30/2023  Name: Alanya Vukelich MRN: [id number] DOB: 1970/11/11  Today's TOC FU Call Status: Today's TOC FU Call Status:: Unsuccessful Call (1st Attempt) Unsuccessful Call (1st Attempt) Date: 06/30/23  Attempted to reach the patient regarding the most recent Inpatient/ED visit.  Follow Up Plan: Additional outreach attempts will be made to reach the patient to complete the Transitions of Care (Post Inpatient/ED visit) call.  Darrall Ellison, LPN [facility] Nurse Health Advisor Direct Dial [phone]  Signature Darrall Ellison, LPN [facility] Nurse Health Advisor Direct Dial [phone]

## 2023-07-01 DIAGNOSIS — G8929 Other chronic pain: Secondary | ICD-10-CM | POA: Diagnosis not present

## 2023-07-01 DIAGNOSIS — M25562 Pain in left knee: Secondary | ICD-10-CM | POA: Diagnosis not present

## 2023-07-01 DIAGNOSIS — M47894 Other spondylosis, thoracic region: Secondary | ICD-10-CM | POA: Diagnosis not present

## 2023-07-01 NOTE — Transitions of Care (Post Inpatient/ED Visit) (Signed)
 07/01/2023  Name: Lindsay [name] MRN: [id number] DOB: [birth year]  Today's TOC FU Call Status: Today's TOC FU Call Status:: Successful TOC FU Call Completed Unsuccessful Call (1st Attempt) Date: 06/30/23 [facility] FU Call Complete Date: 07/01/23 Patient's Name and Date of Birth confirmed.  Transition Care Management Follow-up Telephone Call Date of Discharge: 06/27/23 Discharge Facility: [facility]) Type of Discharge: Inpatient Admission How have you been since you were released from the hospital?: Better  Items Reviewed: Did you receive and understand the discharge instructions provided?: Yes Medications obtained,verified, and reconciled?: Yes (Medications Reviewed) Any new allergies since your discharge?: No Dietary orders reviewed?: Yes Do you have support at home?: Yes Name of Support/Comfort Primary Source: caregiver  Medications Reviewed Today: Medications Reviewed Today     Reviewed by Darrall Ellison, LPN (Licensed Practical Nurse) on 07/01/23 at 1427  Med List Status: <None>   Medication Order Taking? Sig Documenting Provider Last Dose Status Informant  ACCU-CHEK GUIDE test strip [id number] No USE ONE TEST STRIP TO CHECK BLOOD SUGARS ONCE A DAY AS NEEDED Lasandra Points, MD Taking Active Self  Accu-Chek Softclix Lancets lancets [id number] No USE ONE LANCET TO CHECK BLOOD SUGAR ONE A DAY AS NEEDED Lasandra Points, MD Taking Active Self  acetaminophen  (TYLENOL ) 500 MG tablet [id number] No Take 1 tablet (500 mg total) by mouth every 6 (six) hours as needed.  Patient taking differently: Take 500 mg by mouth every 6 (six) hours as needed for mild pain (pain score 1-3).   Deatra Face, MD 06/25/2023 Morning Active Self  albuterol  (PROVENTIL ) (2.5 MG/3ML) 0.083% nebulizer solution [id number] No Take 3 mLs (2.5 mg total) by nebulization every 6 (six) hours as needed for wheezing or shortness of breath. Guilloud, Carolyn, MD 06/24/2023 Morning Active Self  albuterol  (VENTOLIN  HFA)  108 (90 Base) MCG/ACT inhaler [id number] No TAKE 2 PUFFS BY MOUTH EVERY 6 HOURS AS NEEDED FOR WHEEZE OR SHORTNESS OF Arlean Bellow, Adewale A, MD 06/24/2023 Morning Active Self  atorvastatin  (LIPITOR) 40 MG tablet [id number] No TAKE 1 TABLET BY MOUTH EVERY DAY Guilloud, Carolyn, MD 06/25/2023 Evening Active Self  Azelastine  HCl 137 MCG/SPRAY SOLN [id number] No PLACE 1 SPRAY INTO BOTH NOSTRILS 2 (TWO) TIMES DAILY. USE IN EACH NOSTRIL AS DIRECTED  Patient taking differently: Place 1 spray into both nostrils daily as needed (for allergies).   Guilloud, Carolyn, MD Past Month Active Self           Med Note (SATTERFIELD, DARIUS E   Fri May 23, 2023  8:27 PM) Still has on hand   Blood Glucose Monitoring Suppl (ACCU-CHEK GUIDE ME) w/Device KIT [id number] No Dispense one device Lasandra Points, MD Taking Active Self  Brexpiprazole  (REXULTI ) 3 MG TABS [id number] No Take 1 tablet (3 mg total) by mouth daily. [name], Syed T, MD 06/23/2023 Active Self  cetirizine  (ZYRTEC ) 10 MG tablet [id number] No Take 1 tablet (10 mg total) by mouth daily.  Patient taking differently: Take 10 mg by mouth every other day.   Guilloud, Carolyn, MD 06/23/2023 Active Self  dicyclomine  (BENTYL ) 10 MG capsule [id number] No TAKE 2 CAPSULES (20 MG TOTAL) BY MOUTH 3 (THREE) TIMES DAILY AS NEEDED FOR SPASMS (ABDOMINAL PAIN).  Patient taking differently: Take 10 mg by mouth 3 (three) times daily.   Edmonia Gottron, PA-C 06/25/2023 Morning Active Self           Med Note (SATTERFIELD, DARIUS E   Wed Jun 25, 2023  9:17 PM) Patient verified dose is correct and she is  taking every day   diltiazem  (CARDIZEM  CD) 120 MG 24 hr capsule [id number]  Take 1 capsule (120 mg total) by mouth daily. Pasqual Bone, MD  Active   doxepin  (SINEQUAN ) 25 MG capsule [id number] No Take 1 capsule (25 mg total) by mouth at bedtime. [name], Syed T, MD 06/24/2023 Bedtime Active Self  ENTRESTO  24-26 MG [id number] No TAKE 1 TABLET BY MOUTH TWICE A DAY  Patient not taking:  Reported on 06/25/2023   Guilloud, Carolyn, MD Not Taking Active Self  ferrous sulfate  325 (65 FE) MG tablet [id number]  Take 1 tablet (325 mg total) by mouth daily with breakfast. Pasqual Bone, MD  Active   fluticasone  (FLONASE ) 50 MCG/ACT nasal spray [id number] No SPRAY 1 SPRAY INTO EACH NOSTRIL DAILY  Patient taking differently: Place 1 spray into both nostrils daily as needed for allergies.   Guilloud, Carolyn, MD Past Week Active Self           Med Note (SATTERFIELD, Ken Patty   Fri May 23, 2023  8:23 PM) Still has on hand   guaiFENesin  (ROBITUSSIN) 100 MG/5ML liquid [id number] No Take 10 mLs by mouth every 6 (six) hours as needed for cough or to loosen phlegm. Vada Garibaldi, MD Past Month Active Self           Med Note (SATTERFIELD, Ken Patty   Wed Jun 25, 2023  9:13 PM) Still has on hand   hydrocortisone  (ANUSOL -HC) 2.5 % rectal cream [id number] No Place 1 Application rectally daily as needed for hemorrhoids. [provider] Past Month Active Self           Med Note (SATTERFIELD, Ken Patty   Wed Jun 25, 2023  9:13 PM) Still has on hand   ipratropium (ATROVENT ) 0.06 % nasal spray [id number] No Place 2 sprays into both nostrils daily as needed for rhinitis. [provider] Unknown Active Self           Med Note (SATTERFIELD, DARIUS E   Fri May 23, 2023  8:25 PM) Still has on hand   ipratropium-albuterol  (DUONEB) 0.5-2.5 (3) MG/3ML SOLN [id number] No Take 3 mLs by nebulization 2 (two) times daily. Vada Garibaldi, MD Taking Expired 06/24/23 2359   lamoTRIgine  (LAMICTAL ) 100 MG tablet [id number] No Take 0.5 tablets (50 mg total) by mouth 2 (two) times daily. [name], Syed T, MD 06/25/2023 Morning Active Self  LINZESS  290 MCG CAPS capsule [id number] No TAKE 1 CAPSULE BY MOUTH DAILY BEFORE BREAKFAST. Edmonia Gottron, PA-C 06/25/2023 Morning Active Self  meclizine  (ANTIVERT ) 25 MG tablet [id number] No Take 25 mg by mouth 3 (three) times daily. [provider] 06/25/2023 Morning  Active Self  megestrol  (MEGACE ) 40 MG tablet [id number] No Take 2 tablets (80 mg total) by mouth daily.  Patient taking differently: Take 80 mg by mouth daily as needed.   Granville Layer, MD 06/24/2023 Morning Active Self  metoprolol  succinate (TOPROL -XL) 100 MG 24 hr tablet [id number]  Take 1 tablet (100 mg total) by mouth at bedtime. Pasqual Bone, MD  Active   omeprazole  (PRILOSEC) 40 MG capsule [id number] No TAKE 1 CAPSULE (40 MG TOTAL) BY MOUTH DAILY. Edmonia Gottron, PA-C 06/25/2023 Morning Active Self  ondansetron  (ZOFRAN -ODT) 8 MG disintegrating tablet [id number] No Take 1 tablet (8 mg total) by mouth every 8 (eight) hours as needed.  Patient taking differently: Take 8 mg by mouth every 8 (eight) hours as needed for nausea or vomiting.   Driscilla George, MD Past Week Active Self  oxyCODONE  (  OXY IR/ROXICODONE ) 5 MG immediate release tablet [id number] No Take 5 mg by mouth 2 (two) times daily. [provider] 06/24/2023 Evening Active Self           Med Note (SATTERFIELD, DARIUS E   Wed Jun 25, 2023  9:16 PM) Patient verified she is taking every day   promethazine  (PHENERGAN ) 25 MG tablet [id number] No Take 1 tablet (25 mg total) by mouth every 6 (six) hours as needed for nausea or vomiting. Collier, Amanda R, PA-C 06/25/2023 Morning Active Self  Safety Seal Miscellaneous MISC [id number] No Apply 1 Application topically in the morning. Medication Name; AA Gel Dellar Fenton, DO Taking Active Self  Semaglutide , 2 MG/DOSE, (OZEMPIC , 2 MG/DOSE,) 8 MG/3ML SOPN [id number] No INJECT 2 MG INTO THE SKIN ONCE A WEEK. Lasandra Points, MD 06/20/2023 Active Self           Med Note (SATTERFIELD, DARIUS E   Fri May 23, 2023  8:02 PM) Take on Fridays  SUMAtriptan  (IMITREX ) 25 MG tablet [id number]  Take 1 tablet (25 mg total) by mouth once for 1 dose. May repeat in 2 hours if headache persists or recurs. Pasqual Bone, MD  Expired 06/27/23 2359   SYMBICORT  160-4.5 MCG/ACT inhaler [id number] No Inhale 2 puffs  into the lungs 2 (two) times daily. [provider] 06/25/2023  3:00 PM Active Self  tiZANidine  (ZANAFLEX ) 4 MG tablet [id number] No Take 4 mg by mouth every 8 (eight) hours as needed for muscle spasms. [provider] 06/25/2023 Morning Active Self  torsemide  (DEMADEX ) 20 MG tablet [id number] No TAKE 1 TABLET BY MOUTH TWICE A DAY Guilloud, Carolyn, MD 06/25/2023  8:00 AM Active Self            Home Care and Equipment/Supplies: Were Home Health Services Ordered?: NA Any new equipment or medical supplies ordered?: NA  Functional Questionnaire: Do you need assistance with bathing/showering or dressing?: Yes Do you need assistance with meal preparation?: Yes Do you need assistance with eating?: No Do you have difficulty maintaining continence: No Do you need assistance with getting out of bed/getting out of a chair/moving?: No Do you have difficulty managing or taking your medications?: No  Follow up appointments reviewed: PCP Follow-up appointment confirmed?: NA Specialist Hospital Follow-up appointment confirmed?: No Reason Specialist Follow-Up Not Confirmed: Patient has Specialist Provider Number and will Call for Appointment Do you need transportation to your follow-up appointment?: No Do you understand care options if your condition(s) worsen?: Yes-patient verbalized understanding    SIGNATURE Darrall Ellison, LPN [facility] Nurse Health Advisor Direct Dial [phone]

## 2023-07-02 ENCOUNTER — Telehealth

## 2023-07-02 ENCOUNTER — Encounter: Payer: Self-pay | Admitting: Pulmonary Disease

## 2023-07-04 ENCOUNTER — Ambulatory Visit (HOSPITAL_COMMUNITY): Admitting: Licensed Clinical Social Worker

## 2023-07-04 ENCOUNTER — Other Ambulatory Visit: Payer: Self-pay | Admitting: Pulmonary Disease

## 2023-07-04 DIAGNOSIS — F4312 Post-traumatic stress disorder, chronic: Secondary | ICD-10-CM | POA: Diagnosis not present

## 2023-07-04 DIAGNOSIS — F411 Generalized anxiety disorder: Secondary | ICD-10-CM | POA: Diagnosis not present

## 2023-07-04 DIAGNOSIS — F331 Major depressive disorder, recurrent, moderate: Secondary | ICD-10-CM | POA: Diagnosis not present

## 2023-07-04 DIAGNOSIS — F410 Panic disorder [episodic paroxysmal anxiety] without agoraphobia: Secondary | ICD-10-CM

## 2023-07-04 NOTE — Telephone Encounter (Signed)
 Oxygen  should not be discontinued as patient does have a history of chronic respiratory failure, pulmonary hypertension  During the last visit in the office, patient was unable to ambulate because of significant pain and discomfort--we did attempt to do a walk  She has an upcoming appointment  Oxygen  should not be discontinued prior to that-this places the patient at significant risk of harm

## 2023-07-04 NOTE — Progress Notes (Signed)
 Virtual Visit via Video Note  I connected with Lindsay [name] on 07/04/23 at  9:00 AM EDT by a video enabled telemedicine application and verified that I am speaking with the correct person using two identifiers.  Location: Patient: home Provider: home office   I discussed the limitations of evaluation and management by telemedicine and the availability of in person appointments. The patient expressed understanding and agreed to proceed.  I discussed the assessment and treatment plan with the patient. The patient was provided an opportunity to ask questions and all were answered. The patient agreed with the plan and demonstrated an understanding of the instructions.   The patient was advised to call back or seek an in-person evaluation if the symptoms worsen or if the condition fails to improve as anticipated.  I provided 40 minutes of non-face-to-face time during this encounter.  THERAPIST PROGRESS NOTE  Session Time: 9:00 AM to 9:40 AM  Participation Level: Active  Behavioral Response: CasualAlertAnxious and Irritable  Type of Therapy: Individual Therapy  Treatment Goals addressed: Patient reports therapy helps cope with her stressors continue to work on coping strategies to help improve symptoms of her diagnoses, coping in general  ProgressTowards Goals: Progressing-patient going through significant stressors particularly medical assess very helpful for her to have a place to process thoughts and feelings to be going through this with her provide supportive interventions.  Interventions: Solution Focused, Strength-based, Supportive, and Other: coping  Summary: Lindsay [name] is a 53 y.o. female who presents with procedure not sedated at all. It was torture. It was crazy. Still not breathing well on steroids for past month. Have to go to pulmonary doctor have endoscopy and colonoscopy on 15th still not cleared. Both heart and pulmonary haven't cleared. Not going to do this until  know what kind of anesthesia going to do. During procedure started crying, didn't want her to move patient said "fuck that" she is the one going through it. The procedure was very scary. Has to take deep breaths and oxygen  helps. Has to go to emergency ortho-toes and foot broken, [facility]-back and pain management, torn meniscus two fractures in back. Jeffry Minister managing pain. Paperwork fill out so doesn't terminate for housing. A stressor can't do this. Had to get a hold of social security and was on hold for 180 minutes. A lot of crazy stuff happened in the hospital mistaking her from someone was in from 28-30. Not feeling good about doctors and how they are managing things in general. Not feeling good about anything lot of pain toe, right knee, back did surgery from right wrist not supposed to use. Hurts as well. Haven't been sleeping for two days. Hard to get to sleep with pain in right arm. Why did they have to do the right? Therapist said we need June to be behind us . Trying to get pain and muscle relaxant on the phone with pharmacy. Aid is going to get it. Short of breath don't understand it. Need to ask aid to give her the oxygen .   Therapist trying to reframe to positive hard patient going through a lot assess more about her talking about it which she is going through talking about her anxiety and frustration therapist validating her at the same time continue to be encouraging for steps patient is taking to move forward noted past the catheter procedure even though verbal ordeal for patient.  Says helpful for patient to have someone to talk to about all this particular when she is going through  a lot a lot of medical issues.  Dealing with different systems that are difficult to navigate as well being very frustrating and therapist is here for support.  Support to help patient get through this therapist provided support and space for patient to talk about thoughts and feelings in session.   .    Suicidal/Homicidal: No  Plan: Return again in 1 week.2.  Patient going through significant stressors helpful to have supportive strength-based therapy  Diagnosis: Major depressive disorder, recurrent, moderate, generalized anxiety disorder, chronic PTSD, panic attacks  Collaboration of Care: Other none needed  Patient/Guardian was advised Release of Information must be obtained prior to any record release in order to collaborate their care with an outside provider. Patient/Guardian was advised if they have not already done so to contact the registration department to sign all necessary forms in order for us  to release information regarding their care.   Consent: Patient/Guardian gives verbal consent for treatment and assignment of benefits for services provided during this visit. Patient/Guardian expressed understanding and agreed to proceed.   Dallie Duel, LCSW 07/04/2023

## 2023-07-07 ENCOUNTER — Encounter (HOSPITAL_COMMUNITY): Payer: Self-pay | Admitting: Internal Medicine

## 2023-07-07 ENCOUNTER — Encounter (HOSPITAL_COMMUNITY): Payer: Self-pay | Admitting: Physician Assistant

## 2023-07-07 ENCOUNTER — Telehealth: Payer: Self-pay

## 2023-07-07 NOTE — Telephone Encounter (Signed)
 Procedure:colon Procedure date: 07/14/23 Procedure location: [facility] Arrival Time: 845am Spoke with the patient Y/N: Pt states she had a heart cath on  06/25/23. She states she has some trouble breathing and has a appointment tomorrow with the heart DR. She will call later this week to confirm her procedure.  Any prep concerns? n  Has the patient obtained the prep from the pharmacy ? y Do you have a care partner and transportation: y Any additional concerns? n

## 2023-07-07 NOTE — Progress Notes (Signed)
 Attempted to obtain medical history for pre op call via telephone, unable to reach at this time. HIPAA compliant voicemail message left requesting return call to pre surgical testing department.

## 2023-07-07 NOTE — Progress Notes (Signed)
 Lindsay [name]  PCP-Osei-Bonsu MD Cardiologist-Kadakia MD d/t see tomorrow PulmonologistTharon Finder MD  EKG-06/27/23 Echo-01/14/23 Cath-06/26/23 [name] ICD/PM-ICD GLP1-Ozempic  last dose 6/6, hold 1 week Blood Thinner- n/a   HX: CHF,HTN, Stroke, Hidradenitis, Polyp on vocal cord, COPD,. Was d/c from hospital 5/30 d/t pulm HTN/ COPD exacerbation. She is due to see her cardiologist tomorrow and due to see pulm 6/11 for hospital f/u and clearance. Per patient she is on 4L 02 and still finding herself SOB with activity, walking in house makes her SOB. Does use breathing treatments, no other equipment use other than 02. Anesthesia Review- Yes- Needs pulm and cardiac clearance, office notified 6/9.

## 2023-07-07 NOTE — Telephone Encounter (Signed)
 [facility] Pre-operative Risk Assessment     Request for surgical clearance:     Endoscopy Procedure  What type of surgery is being performed?     EGD/ Colonoscopy   When is this surgery scheduled?     07/14/2023  What type of clearance is required ?   Cardiac   Are there any medications that need to be held prior to surgery and how long? N/A  Practice name and name of physician performing surgery?      [facility]/Dr. Leonides Schanz  What is your office phone and fax number?      Phone- [phone]  Fax- [phone]  Anesthesia type (None, local, MAC, general) ?       MAC   Please route your response to Emeline Darling RN

## 2023-07-08 ENCOUNTER — Other Ambulatory Visit: Payer: Self-pay

## 2023-07-08 DIAGNOSIS — R0602 Shortness of breath: Secondary | ICD-10-CM | POA: Diagnosis not present

## 2023-07-08 DIAGNOSIS — J449 Chronic obstructive pulmonary disease, unspecified: Secondary | ICD-10-CM | POA: Diagnosis not present

## 2023-07-08 DIAGNOSIS — I2723 Pulmonary hypertension due to lung diseases and hypoxia: Secondary | ICD-10-CM | POA: Diagnosis not present

## 2023-07-08 NOTE — Telephone Encounter (Signed)
 Pt has OV today

## 2023-07-09 ENCOUNTER — Telehealth: Payer: Self-pay

## 2023-07-09 ENCOUNTER — Other Ambulatory Visit: Payer: Self-pay | Admitting: Pulmonary Disease

## 2023-07-09 ENCOUNTER — Ambulatory Visit: Admitting: Pulmonary Disease

## 2023-07-09 ENCOUNTER — Encounter: Payer: Self-pay | Admitting: Pulmonary Disease

## 2023-07-09 VITALS — BP 126/84 | HR 106 | Ht 71.0 in | Wt 335.2 lb

## 2023-07-09 DIAGNOSIS — R0602 Shortness of breath: Secondary | ICD-10-CM

## 2023-07-09 MED ORDER — BREZTRI AEROSPHERE 160-9-4.8 MCG/ACT IN AERO: 2.0000 | INHALATION_SPRAY | Freq: Two times a day (BID) | RESPIRATORY_TRACT | 3 refills | Status: DC

## 2023-07-09 NOTE — Patient Outreach (Signed)
 Complex Care Management   Visit Note  07/09/2023  Name:  Lindsay [name] MRN: [id number] DOB: Dec 14, 1970  Situation: Referral received for Complex Care Management related to Heart Failure, COPD, and SDOH Barriers:  Transportation I obtained verbal consent from Patient.  Visit completed with Lindsay [name]  on the phone  Background:   Past Medical History:  Diagnosis Date   Acid reflux    Anemia    Iron  Def   Anorexia    CHF (congestive heart failure) (HCC)    Chronic kidney disease    Nephrotic syndrome   Colon polyp 2009   Depression with anxiety    Edema leg    Fibroid tumor 04/2022   Fibromyalgia    Hemorrhoids    Hidradenitis suppurativa    Hypertension    IBS (irritable bowel syndrome)    Low back pain    Migraines    Morbidly obese (HCC)    Neuromuscular disorder (HCC)    fibromyalgia   Neuropathy    Panic attacks    Polyp of vocal cord or larynx    Stroke (HCC)    Tonsil pain     Assessment: Patient Reported Symptoms:  Cognitive Cognitive Status: Alert and oriented to person, place, and time, Insightful and able to interpret abstract concepts, Normal speech and language skills      Neurological Neurological Review of Symptoms: No symptoms reported    HEENT HEENT Symptoms Reported: No symptoms reported      Cardiovascular Cardiovascular Symptoms Reported: No symptoms reported Does patient have uncontrolled Hypertension?: No    Respiratory Respiratory Symptoms Reported: Shortness of breath    Endocrine Patient reports the following symptoms related to hypoglycemia or hyperglycemia : No symptoms reported    Gastrointestinal Gastrointestinal Symptoms Reported: Constipation Additional Gastrointestinal Details: Takes Linzess       Genitourinary Genitourinary Symptoms Reported: No symptoms reported    Integumentary Integumentary Symptoms Reported: No symptoms reported (Cardiac Cath, right wrist incision is closed.  Cardiology aware.)    Musculoskeletal  Musculoskelatal Symptoms Reviewed: No symptoms reported        Psychosocial Psychosocial Symptoms Reported: No symptoms reported            05/28/2023    1:32 PM  Depression screen PHQ 2/9  Decreased Interest 1  Down, Depressed, Hopeless 1  PHQ - 2 Score 2  Altered sleeping 2  Tired, decreased energy 2  Change in appetite 1  Feeling bad or failure about yourself  0  Trouble concentrating 1  Moving slowly or fidgety/restless 1  Suicidal thoughts 0  PHQ-9 Score 9  Difficult doing work/chores Somewhat difficult    There were no vitals filed for this visit.  Medications Reviewed Today   Medications were not reviewed in this encounter     Recommendation:   -Specialty provider follow-up : Pulmonology 07/09/23 DME requests:  other CPAP - she will discuss CPAP with Pulmonologist at 07/09/23 appt - current CPAP doesn't work, Statistician checked insurance, she would have to pay out of pocket for a new CPAP, insurance will provide after April 2026.  She will inquire if her current CPAP can be repaired.  -Patient will call [employer] to inquire about fan give-away program, and she will call [employer] to inquire about [employer].  -Patient had cardiac cath on 06/25/23, at f/u on 07/08/23 she was prescribed Keflex due to pain/swelling at cath insertion site to right wrist.  She will take antibiotic as prescribed, will report worsening pain/swelling/redness/drainage/bleeding.   Follow Up  Plan:   Telephone follow-up two weeks.   Jurline Olmsted BSN, CCM [place]  VBCI Population Health RN Care Manager Direct Dial: [phone]  Fax: [phone]

## 2023-07-09 NOTE — Telephone Encounter (Signed)
 Cardiac Clearance received for pt. Sent to be scanned into Epic.   Pt had pulmonary Office Visit  today and notes were reviewed with Dr. Rosaline Coma.  Documentation form office note today from Dr. Gaynell Keeler stated the following. I do not believe patient is stable enough for endoscopy at present with a degree of shortness of breath   Pt to be rescheduled at a later date for her procedures.  Pt made aware.  Procedure that were scheduled on 07/14/2023 canceled. Pt made aware.  Pt added to wait list.   Pt verbalized understanding with all questions answered.

## 2023-07-09 NOTE — Telephone Encounter (Signed)
 Cardiac Clearance received from Dr. Sharyn Deforest, Dr. Rosaline Coma made aware. Copy made and original sent to be scanned into Epic.   Pt chart was reviewed with Dr. Rosaline Coma. Additional documentation  documented in alternate  phone note.

## 2023-07-09 NOTE — Telephone Encounter (Signed)
 I reviewed with the preop APP today Lindsay Duncan, NP as to cardiologist pt is following. Per Lindsay Duncan, NP: looks like she belongs to [facility]   Me: oh ok, so they need to send request there then correct   Lindsay [name]: correct, looks like pulm appt today   I will update the requesting office to reach out to Dr. Glena Landau office for preop clearance.

## 2023-07-09 NOTE — Telephone Encounter (Signed)
 Lindsay [name] [birth year] [id number]  07/09/2023   Dear Dr. Gaynell Keeler:  We have scheduled the above named patient for a colonoscopy and endoscopy at [facility] on 07/14/2023. We are requesting medical clearance from pulmonology at this time.  Please route your response to Edra Govern, RN or fax response to [phone].  Sincerely,  [facility]

## 2023-07-09 NOTE — Telephone Encounter (Signed)
 Pulmonology OV reviewed. Endoscopic procedures will be cancelled at this time.

## 2023-07-09 NOTE — Patient Instructions (Signed)
 Will change you from Symbicort  to Breztri Breztri's 2 puffs twice a day  Continue nebulizer medications  Will reach out to your cardiologist regarding the pulmonary hypertension and your degree of shortness of breath  We will schedule you for breathing study  Adjust your oxygen  to keep saturations above 90%  Follow-up in about 6 to 8 weeks

## 2023-07-09 NOTE — Progress Notes (Signed)
 Lindsay [name]    [id number]    1970-02-13  Primary Care Physician:Osei-Bonsu, Caretha Chapel, MD  Referring Physician: Lasandra Points, MD [address] [place],  [place] [address]  Chief complaint:   Ongoing shortness of breath, cough, Recently hospitalized for respiratory failure  HPI:  Ongoing shortness of breath Not really coughing or bringing up any secretions at present but very short of breath with minimal activity  She does desaturate quickly  Uses oxygen  at about 5 L  Has a history of diastolic heart failure, obstructive sleep apnea, obstructive lung disease, chronic respiratory failure  She quit smoking in 2021  History of depression, PTSD  Sleep apnea diagnosed in 2016 Currently not on CPAP therapy, insurance not paying for CPAP  Recently had cardiac catheterization showing mild pulmonary hypertension with a mean pulmonary artery pressure of 27  She has obstructive lung disease, compliant with inhalers  Outpatient Encounter Medications as of 07/09/2023  Medication Sig   ACCU-CHEK GUIDE test strip USE ONE TEST STRIP TO CHECK BLOOD SUGARS ONCE A DAY AS NEEDED   Accu-Chek Softclix Lancets lancets USE ONE LANCET TO CHECK BLOOD SUGAR ONE A DAY AS NEEDED   acetaminophen  (TYLENOL ) 500 MG tablet Take 1 tablet (500 mg total) by mouth every 6 (six) hours as needed. (Patient taking differently: Take 500 mg by mouth every 6 (six) hours as needed for mild pain (pain score 1-3).)   albuterol  (PROVENTIL ) (2.5 MG/3ML) 0.083% nebulizer solution Take 3 mLs (2.5 mg total) by nebulization every 6 (six) hours as needed for wheezing or shortness of breath.   albuterol  (VENTOLIN  HFA) 108 (90 Base) MCG/ACT inhaler TAKE 2 PUFFS BY MOUTH EVERY 6 HOURS AS NEEDED FOR WHEEZE OR SHORTNESS OF BREATH   atorvastatin  (LIPITOR) 40 MG tablet TAKE 1 TABLET BY MOUTH EVERY DAY   Azelastine  HCl 137 MCG/SPRAY SOLN PLACE 1 SPRAY INTO BOTH NOSTRILS 2 (TWO) TIMES DAILY. USE IN EACH NOSTRIL AS DIRECTED  (Patient taking differently: Place 1 spray into both nostrils daily as needed (for allergies).)   Blood Glucose Monitoring Suppl (ACCU-CHEK GUIDE ME) w/Device KIT Dispense one device   Brexpiprazole  (REXULTI ) 3 MG TABS Take 1 tablet (3 mg total) by mouth daily.   cetirizine  (ZYRTEC ) 10 MG tablet Take 1 tablet (10 mg total) by mouth daily. (Patient taking differently: Take 10 mg by mouth every other day.)   dicyclomine  (BENTYL ) 10 MG capsule TAKE 2 CAPSULES (20 MG TOTAL) BY MOUTH 3 (THREE) TIMES DAILY AS NEEDED FOR SPASMS (ABDOMINAL PAIN). (Patient taking differently: Take 10 mg by mouth 3 (three) times daily.)   diltiazem  (CARDIZEM  CD) 120 MG 24 hr capsule Take 1 capsule (120 mg total) by mouth daily.   doxepin  (SINEQUAN ) 25 MG capsule Take 1 capsule (25 mg total) by mouth at bedtime.   ENTRESTO  24-26 MG TAKE 1 TABLET BY MOUTH TWICE A DAY   ferrous sulfate  325 (65 FE) MG tablet Take 1 tablet (325 mg total) by mouth daily with breakfast.   fluticasone  (FLONASE ) 50 MCG/ACT nasal spray SPRAY 1 SPRAY INTO EACH NOSTRIL DAILY (Patient taking differently: Place 1 spray into both nostrils daily as needed for allergies.)   guaiFENesin  (ROBITUSSIN) 100 MG/5ML liquid Take 10 mLs by mouth every 6 (six) hours as needed for cough or to loosen phlegm.   hydrocortisone  (ANUSOL -HC) 2.5 % rectal cream Place 1 Application rectally daily as needed for hemorrhoids.   ipratropium (ATROVENT ) 0.06 % nasal spray Place 2 sprays into both  nostrils daily as needed for rhinitis.   lamoTRIgine  (LAMICTAL ) 100 MG tablet Take 0.5 tablets (50 mg total) by mouth 2 (two) times daily.   LINZESS  290 MCG CAPS capsule TAKE 1 CAPSULE BY MOUTH DAILY BEFORE BREAKFAST.   meclizine  (ANTIVERT ) 25 MG tablet Take 25 mg by mouth 3 (three) times daily.   megestrol  (MEGACE ) 40 MG tablet Take 2 tablets (80 mg total) by mouth daily. (Patient taking differently: Take 80 mg by mouth daily as needed.)   metoprolol  succinate (TOPROL -XL) 100 MG 24 hr  tablet Take 1 tablet (100 mg total) by mouth at bedtime.   omeprazole  (PRILOSEC) 40 MG capsule TAKE 1 CAPSULE (40 MG TOTAL) BY MOUTH DAILY.   ondansetron  (ZOFRAN -ODT) 8 MG disintegrating tablet Take 1 tablet (8 mg total) by mouth every 8 (eight) hours as needed. (Patient taking differently: Take 8 mg by mouth every 8 (eight) hours as needed for nausea or vomiting.)   oxyCODONE  (OXY IR/ROXICODONE ) 5 MG immediate release tablet Take 5 mg by mouth 2 (two) times daily.   promethazine  (PHENERGAN ) 25 MG tablet Take 1 tablet (25 mg total) by mouth every 6 (six) hours as needed for nausea or vomiting.   Safety Seal Miscellaneous MISC Apply 1 Application topically in the morning. Medication Name; AA Gel   Semaglutide , 2 MG/DOSE, (OZEMPIC , 2 MG/DOSE,) 8 MG/3ML SOPN INJECT 2 MG INTO THE SKIN ONCE A WEEK.   SYMBICORT  160-4.5 MCG/ACT inhaler Inhale 2 puffs into the lungs 2 (two) times daily.   tiZANidine  (ZANAFLEX ) 4 MG tablet Take 4 mg by mouth every 8 (eight) hours as needed for muscle spasms.   torsemide  (DEMADEX ) 20 MG tablet TAKE 1 TABLET BY MOUTH TWICE A DAY   ipratropium-albuterol  (DUONEB) 0.5-2.5 (3) MG/3ML SOLN Take 3 mLs by nebulization 2 (two) times daily.   SUMAtriptan  (IMITREX ) 25 MG tablet Take 1 tablet (25 mg total) by mouth once for 1 dose. May repeat in 2 hours if headache persists or recurs.   No facility-administered encounter medications on file as of 07/09/2023.    Allergies as of 07/09/2023 - Review Complete 07/07/2023  Allergen Reaction Noted   Lisinopril  Rash and Cough 11/06/2015    Past Medical History:  Diagnosis Date   Acid reflux    Anemia    Iron  Def   Anorexia    CHF (congestive heart failure) (HCC)    Chronic kidney disease    Nephrotic syndrome   Colon polyp 2009   Depression with anxiety    Edema leg    Fibroid tumor 04/2022   Fibromyalgia    Hemorrhoids    Hidradenitis suppurativa    Hypertension    IBS (irritable bowel syndrome)    Low back pain     Migraines    Morbidly obese (HCC)    Neuromuscular disorder (HCC)    fibromyalgia   Neuropathy    Panic attacks    Polyp of vocal cord or larynx    Stroke (HCC)    Tonsil pain     Past Surgical History:  Procedure Laterality Date   AXILLARY HIDRADENITIS EXCISION     COLONOSCOPY WITH PROPOFOL  N/A 05/25/2015   Procedure: COLONOSCOPY WITH PROPOFOL ;  Surgeon: Janel Medford, MD;  Location: [facility];  Service: Endoscopy;  Laterality: N/A;   COLONOSCOPY WITH PROPOFOL  N/A 12/31/2018   Procedure: COLONOSCOPY WITH PROPOFOL ;  Surgeon: Janel Medford, MD;  Location: [facility];  Service: Endoscopy;  Laterality: N/A;   HEMORRHOID SURGERY     with Hidradenitis  surgery    INGUINAL HIDRADENITIS EXCISION     RIGHT/LEFT HEART CATH AND CORONARY ANGIOGRAPHY N/A 06/26/2023   Procedure: RIGHT/LEFT HEART CATH AND CORONARY ANGIOGRAPHY;  Surgeon: Pasqual Bone, MD;  Location: [facility];  Service: Cardiovascular;  Laterality: N/A;   TONSILLECTOMY  10/18/2010   by Dr. Melissa Spring   UPPER GASTROINTESTINAL ENDOSCOPY      Family History  Problem Relation Age of Onset   Diabetes Mother    Heart disease Mother        valve leak   Anxiety disorder Mother    Depression Mother    High blood pressure Mother    Kidney failure Mother    Cancer Father        prostate   Heart disease Father    Learning disabilities Sister    Depression Sister    Depression Sister    Anxiety disorder Sister    Colon cancer Neg Hx     Social History   Socioeconomic History   Marital status: Single    Spouse name: Not on file   Number of children: 1   Years of education: 11   Highest education level: Not on file  Occupational History   Occupation: Disabled  Tobacco Use   Smoking status: Former    Current packs/day: 0.00    Average packs/day: 0.1 packs/day for 25.0 years (2.5 ttl pk-yrs)    Types: Cigarettes    Start date: 05/24/1994    Quit date: 05/24/2019    Years since quitting: 4.1   Smokeless  tobacco: Never   Tobacco comments:    quit in April.  Vaping Use   Vaping status: Never Used  Substance and Sexual Activity   Alcohol use: Not Currently    Alcohol/week: 0.0 standard drinks of alcohol    Comment: none sine 05/2018   Drug use: Not Currently    Frequency: 3.0 times per week    Types: Marijuana   Sexual activity: Yes    Birth control/protection: Implant    Comment: placed in March 2024  Other Topics Concern   Not on file  Social History Narrative   Lives at home w/ her mother and daughter   Right-handed   Caffeine: about 3 Cokes per week   Social Drivers of Health   Financial Resource Strain: High Risk (06/05/2023)   Overall Financial Resource Strain (CARDIA)    Difficulty of Paying Living Expenses: Very hard  Food Insecurity: Food Insecurity Present (06/25/2023)   Hunger Vital Sign    Worried About Running Out of Food in the Last Year: Often true    Ran Out of Food in the Last Year: Often true  Transportation Needs: Unmet Transportation Needs (06/25/2023)   PRAPARE - Administrator, Civil Service (Medical): Yes    Lack of Transportation (Non-Medical): Yes  Physical Activity: Unknown (05/27/2023)   Received from [facility]   Exercise Vital Sign    Days of Exercise per Week: 0 days    Minutes of Exercise per Session: Not on file  Stress: Stress Concern Present (05/27/2023)   Received from [facility] of Occupational Health - Occupational Stress Questionnaire    Feeling of Stress : To some extent  Social Connections: Socially Isolated (05/27/2023)   Received from [facility]   Social Network    How would you rate your social network (family, work, friends)?: Little participation, lonely and socially isolated  Intimate Partner Violence: Not At Risk (06/25/2023)  Humiliation, Afraid, Rape, and Kick questionnaire    Fear of Current or Ex-Partner: No    Emotionally Abused: No    Physically Abused: No    Sexually Abused: No     Review of Systems  Constitutional:  Positive for fatigue.  Respiratory:  Positive for apnea and shortness of breath.   Musculoskeletal:  Positive for arthralgias.  Psychiatric/Behavioral:  Positive for sleep disturbance.     There were no vitals filed for this visit.    Physical Exam Constitutional:      Appearance: She is obese.  HENT:     Head: Normocephalic.     Mouth/Throat:     Mouth: Mucous membranes are moist.  Eyes:     General: No scleral icterus. Cardiovascular:     Rate and Rhythm: Normal rate and regular rhythm.     Heart sounds: No murmur heard.    No friction rub.  Pulmonary:     Effort: No respiratory distress.     Breath sounds: No stridor. No wheezing or rhonchi.  Musculoskeletal:     Cervical back: No rigidity or tenderness.  Neurological:     Mental Status: She is alert.  Psychiatric:        Mood and Affect: Mood normal.    Data Reviewed: Recent hospital records reviewed  Recent CT reviewed showing enlarged pulmonary arteries  Records from recent cardiac catheterization reviewed-elevated mean pulmonary artery pressure  Sleep study report reviewed  Last PFT was in 2020. - Showing moderately severe obstructive disease with severe reduction in diffusing capacity  Echocardiogram with diastolic dysfunction, right atrial pressure of about 15  Assessment:  Chronic respiratory failure - Continue oxygen  supplementation - Continues on oxygen  supplementation at about 5 L  Struggling with pain and discomfort as well  Chronic obstructive pulmonary disease chronic bronchitis - Continue bronchodilator treatments - Will escalate to Breztri  Pulmonary hypertension Mild on recent cardiac catheterization but patient is very symptomatic - She does not have an appointment to follow-up with Dr. Glena Landau in at least 2 weeks to follow-up - I believe she may benefit from treatment of pulmonary hypertension  Underlying diastolic heart failure - Appears  to be euvolemic - On Demadex   Obstructive sleep apnea - Will benefit from CPAP therapy - Insurance coverage not paying for CPAP at present   Plan/Recommendations: Placed order for new CPAP auto CPAP 5-18 with heated medication with AirFit P10 medium size mask-not covered for at present  Continue oxygen  supplement nasal  Escalated to Breztri from Symbicort   Scheduled for pulmonary function test  I do not believe patient is stable enough for endoscopy at present with a degree of shortness of breath  Therapeutic options for pulmonary hypertension when she sees cardiology - Consider referral to heart failure clinic  Follow-up in about 6 to 8 weeks  Encouraged to call with significant concerns   Not stable for endoscopy at present   Myer Artis MD [facility] 07/09/2023, 8:59 AM  CC: Lasandra Points, MD

## 2023-07-09 NOTE — Patient Instructions (Signed)
 Visit Information  Thank you for taking time to visit with me today. Please don't hesitate to contact me if I can be of assistance to you before our next scheduled appointment.  Your next care management appointment is by telephone on Monday, June 23rd at 9:00am.  I will follow up with you regarding Pulmonary visit, status of CPAP, transportation [facility] I-[facility] [phone]), and fan ([employer] at [phone]).   Please call the care guide team at [phone] if you need to cancel, schedule, or reschedule an appointment.   A reminder to ALL patients/family/friends, please call the [place]  National Suicide Prevention Lifeline: 772-024-3163 or TTY: (430) 033-3262 TTY 787-314-2544) to talk to a trained counselor if you are experiencing a Mental Health or Behavioral Health Crisis or need someone to talk to.  Jurline Olmsted BSN, CCM [place]  VBCI Population Health RN Care Manager Direct Dial: [phone]  Fax: [phone]

## 2023-07-09 NOTE — Telephone Encounter (Signed)
-----   Message from Nurse Archer Kobs C sent at 05/15/2023 12:30 PM EDT ----- Send pulmonology clearance, patient has OV 6/11 with pulm & procedure with us  on 6/16.

## 2023-07-10 DIAGNOSIS — J9620 Acute and chronic respiratory failure, unspecified whether with hypoxia or hypercapnia: Secondary | ICD-10-CM | POA: Diagnosis not present

## 2023-07-10 DIAGNOSIS — G4733 Obstructive sleep apnea (adult) (pediatric): Secondary | ICD-10-CM | POA: Diagnosis not present

## 2023-07-10 NOTE — Telephone Encounter (Signed)
 Added patient to Dr. Les Rao waitlist. Pt has pulm visit again in 08/22/23. Will continue to follow up with patient.

## 2023-07-11 ENCOUNTER — Ambulatory Visit (INDEPENDENT_AMBULATORY_CARE_PROVIDER_SITE_OTHER): Admitting: Licensed Clinical Social Worker

## 2023-07-11 DIAGNOSIS — F410 Panic disorder [episodic paroxysmal anxiety] without agoraphobia: Secondary | ICD-10-CM

## 2023-07-11 DIAGNOSIS — F4312 Post-traumatic stress disorder, chronic: Secondary | ICD-10-CM

## 2023-07-11 DIAGNOSIS — F411 Generalized anxiety disorder: Secondary | ICD-10-CM

## 2023-07-11 DIAGNOSIS — F331 Major depressive disorder, recurrent, moderate: Secondary | ICD-10-CM

## 2023-07-11 NOTE — Progress Notes (Signed)
 More than 50% of session done by phone due to technological difficulties  Virtual Visit via Video Note  I connected with Lindsay [name] on 07/11/23 at  8:00 AM EDT by a video enabled telemedicine application and verified that I am speaking with the correct person using two identifiers.  Location: Patient: home Provider: home office   I discussed the limitations of evaluation and management by telemedicine and the availability of in person appointments. The patient expressed understanding and agreed to proceed.   I discussed the assessment and treatment plan with the patient. The patient was provided an opportunity to ask questions and all were answered. The patient agreed with the plan and demonstrated an understanding of the instructions.   The patient was advised to call back or seek an in-person evaluation if the symptoms worsen or if the condition fails to improve as anticipated.  I provided 40 minutes of non-face-to-face time during this encounter.  THERAPIST PROGRESS NOTE  Session Time: 8:00 AM to 8:40 AM  Participation Level: Active  Behavioral Response: CasualAlertAnxious and Depressed  Type of Therapy: Individual Therapy  Treatment Goals addressed: Patient reports therapy helps cope with her stressors continue to work on coping strategies to help improve symptoms of her diagnoses, coping in general  ProgressTowards Goals: Progressing-patient has serious medical issues therapist utilize intervention for her to be an advocate for herself given the severity of issues assessed positive patient made motivational statements that she will be an advocate also benefits from processing thoughts and feelings therapist providing supportive strength-based intervention  Interventions: Solution Focused, Strength-based, Supportive, and Other: coping  Summary: Lindsay [name] is a 53 y.o. female who presents with updated therapist not cleared for colonoscopy pulmonary did not clear her also  got news from pulmonary something wrong with heart and heart doctor saying something wrong with lungs.  Patient asking what about this internal bleeding and no one is doing anything about it not giving her any options.  Patient feels she sitting there dying scared for her life nobody doing anything does not know what to do when to focus on her weight and not focus directly on her concerns which is she cannot breathe.  Therapist encouraged patient to push and was glad when patient said she is going to push a motivational statement as where she has to find answers is with the doctors for these medical concerns.  She has an appointment with heart doctor on Monday and she will advocate asking the doctor to help her since he is a heart doctor especially Tia's to help her with heart issues.  Even advocate for other things to be looked into when she talks to doctors.  Therapist notes she thinks the not breathing is serious so this needs to be brought to doctors attention to to address it.  Patient will advocate to go to the heart doctor take it from there she is getting an inhaler today see what that does related not can be able to get CPAP until next year was told qualify and then not qualify for Meals on Wheels.  Therapist asked what she does with her day and she says sits and cries cannot focus on other things with all this going on does not talk to family knows there is nothing they can do again helpful for therapy she says it helps therapist can see that her therapist is one of her main supports. Therapist offered emotional support, active listening and validation of patient's emotional experience as appropriate during session.  Therapist engaged in open questions as therapist assesses problem solving with these medical issues his main focus for interventions along with supportive strength-based intervention therapist noting patient is advocated all her life and she is good at it so use the skills now when she needs  them.  Suicidal/Homicidal: No  Plan: Return again in 1 week.2.  Processed thoughts and feelings in session to help with coping therapist's significant support for patient so continue to listen provide supportive and strength-based intervention  Diagnosis: Major depressive disorder, recurrent, moderate, generalized anxiety disorder, chronic PTSD, panic attacks  Collaboration of Care: Other none needed  Patient/Guardian was advised Release of Information must be obtained prior to any record release in order to collaborate their care with an outside provider. Patient/Guardian was advised if they have not already done so to contact the registration department to sign all necessary forms in order for us  to release information regarding their care.   Consent: Patient/Guardian gives verbal consent for treatment and assignment of benefits for services provided during this visit. Patient/Guardian expressed understanding and agreed to proceed.   Dallie Duel, LCSW 07/11/2023

## 2023-07-14 ENCOUNTER — Encounter (HOSPITAL_COMMUNITY): Admission: RE | Payer: Self-pay | Source: Home / Self Care

## 2023-07-14 ENCOUNTER — Ambulatory Visit (HOSPITAL_COMMUNITY): Admission: RE | Admit: 2023-07-14 | Source: Home / Self Care | Admitting: Internal Medicine

## 2023-07-14 DIAGNOSIS — I5042 Chronic combined systolic (congestive) and diastolic (congestive) heart failure: Secondary | ICD-10-CM | POA: Diagnosis not present

## 2023-07-14 DIAGNOSIS — E1122 Type 2 diabetes mellitus with diabetic chronic kidney disease: Secondary | ICD-10-CM | POA: Diagnosis not present

## 2023-07-14 DIAGNOSIS — E782 Mixed hyperlipidemia: Secondary | ICD-10-CM | POA: Diagnosis not present

## 2023-07-14 DIAGNOSIS — I10 Essential (primary) hypertension: Secondary | ICD-10-CM | POA: Diagnosis not present

## 2023-07-14 SURGERY — COLONOSCOPY
Anesthesia: Monitor Anesthesia Care

## 2023-07-15 ENCOUNTER — Encounter: Payer: Self-pay | Admitting: Family Medicine

## 2023-07-15 ENCOUNTER — Other Ambulatory Visit: Payer: Self-pay

## 2023-07-15 ENCOUNTER — Telehealth: Payer: Self-pay

## 2023-07-15 ENCOUNTER — Encounter: Payer: Self-pay | Admitting: Pulmonary Disease

## 2023-07-15 DIAGNOSIS — D509 Iron deficiency anemia, unspecified: Secondary | ICD-10-CM

## 2023-07-15 DIAGNOSIS — K589 Irritable bowel syndrome without diarrhea: Secondary | ICD-10-CM

## 2023-07-15 MED ORDER — PROMETHAZINE HCL 25 MG PO TABS: 25.0000 mg | ORAL_TABLET | Freq: Four times a day (QID) | ORAL | 1 refills | Status: DC | PRN

## 2023-07-15 MED ORDER — DICYCLOMINE HCL 10 MG PO CAPS: 10.0000 mg | ORAL_CAPSULE | Freq: Three times a day (TID) | ORAL | 2 refills | Status: DC | PRN

## 2023-07-15 MED ORDER — IPRATROPIUM-ALBUTEROL 0.5-2.5 (3) MG/3ML IN SOLN: 3.0000 mL | Freq: Two times a day (BID) | RESPIRATORY_TRACT | 1 refills | Status: DC

## 2023-07-15 NOTE — Telephone Encounter (Signed)
Ambulatory referral to Hematology in epic.

## 2023-07-15 NOTE — Telephone Encounter (Signed)
 Dr Gaynell Keeler,   Please advise. I also sent a RX for the Duoneb solution.

## 2023-07-15 NOTE — Addendum Note (Signed)
 Addended by: Vitaly Wanat N on: 07/15/2023 02:08 PM   Modules accepted: Orders

## 2023-07-18 ENCOUNTER — Other Ambulatory Visit: Payer: Self-pay | Admitting: Pulmonary Disease

## 2023-07-18 ENCOUNTER — Ambulatory Visit (HOSPITAL_COMMUNITY): Admitting: Licensed Clinical Social Worker

## 2023-07-18 DIAGNOSIS — F331 Major depressive disorder, recurrent, moderate: Secondary | ICD-10-CM

## 2023-07-18 DIAGNOSIS — F4312 Post-traumatic stress disorder, chronic: Secondary | ICD-10-CM | POA: Diagnosis not present

## 2023-07-18 DIAGNOSIS — J4540 Moderate persistent asthma, uncomplicated: Secondary | ICD-10-CM

## 2023-07-18 DIAGNOSIS — F410 Panic disorder [episodic paroxysmal anxiety] without agoraphobia: Secondary | ICD-10-CM | POA: Diagnosis not present

## 2023-07-18 DIAGNOSIS — F411 Generalized anxiety disorder: Secondary | ICD-10-CM

## 2023-07-18 NOTE — Progress Notes (Signed)
 More than 50% of session done by phone due to technological difficulties  Virtual Visit via Video Note  I connected with Lindsay [name] on 07/18/23 at  8:00 AM EDT by a video enabled telemedicine application and verified that I am speaking with the correct person using two identifiers.  Location: Patient: home Provider: home office   I discussed the limitations of evaluation and management by telemedicine and the availability of in person appointments. The patient expressed understanding and agreed to proceed.   I discussed the assessment and treatment plan with the patient. The patient was provided an opportunity to ask questions and all were answered. The patient agreed with the plan and demonstrated an understanding of the instructions.   The patient was advised to call back or seek an in-person evaluation if the symptoms worsen or if the condition fails to improve as anticipated.  I provided 48 minutes of non-face-to-face time during this encounter.  THERAPIST PROGRESS NOTE  Session Time: 8:00 AM to 8:48 AM  Participation Level: Active  Behavioral Response: CasualAlertanxious  Type of Therapy: Individual Therapy  Treatment Goals addressed: Patient reports therapy helps cope with her stressors continue to work on coping strategies to help improve symptoms of her diagnoses, coping in general  ProgressTowards Goals: Progressing-patient has medical issues and anxiety therapy helping her manage anxiety helping through supportive strength-based interventions through checking in for updates to encourage patient on problem solving, processing thoughts and feelings in session  Interventions: Solution Focused, Strength-based, Supportive, and Other: coping  Summary: Lindsay [name] is a 53 y.o. female who presents with struggling with medical issues frustration with doctors as finds relief and talking to therapist.  Therapist wanted to know about appointment with heart doctor that she was  supposed to do after a session.  Therapist impression and patient agreed did not seem to notice all her issues for example said she did not have pulmonary hypertension and she said she did doctor saying some of her issues not significant and patient saying with her what may not seem significant is making her sick least little thing doctors do not understand could be a source of her problems.  Again patient looks like the crazy person she telling heart doctor the pulmonary doctor says something wrong with the heart and heart doctor saying nothing wrong he told her to have him call him and therapist noted wise patient being the 1 to have to tell the doctor to call him patient also agrees stupid stuff doctors waiting for 1 to call the other.  Doctor was concerned with rapid heartbeat increased her medication inhaler increases her heart rate.  Pulmonary telling her to use it coronary doctor saying do not use it so they are contradicting each other.  Upsetting for patient.  Patient says she is grasping for air and he is telling patient to have him call her pisses her off cannot.  Body aches.  Noted there is a steroid in the nebulizer asked the GI doctor what can they do what test for internal bleeding very concerned about this cannot put her to sleep.  Taking medications for nausea and stomach spasms internal bleeding is serious to her.  Therapist shared her understanding they were going to put that off and the patient says that is the case but concerned about this issue and do not have any answers.  Talked about her pain issues orthopedic surgeon looking at her broken toes to fractures in her back which are hurting her today going to a  spine specialist at [facility] for that when takes deep breath her back hurts.  Plan to have her have another ablation do not want another rescheduling her appointment but going on that day that scheduled she is going to see her primary care doctor. Repas returned to asking about her  breathing OBS study in 6 weeks patient's make sure that she is on his schedule.  Patient noted fluid build has helped stop treatments really she needed them the treatment helps the nebulizer and inhaler 2 times a day albuterol  patient thinks the combination of things not grasping for air but not significant amount of difference.  Therapist noted this is positive any little bit of positive news likes to hear. Concerned about her daughter and getting SSI she has not had 12 consecutive weeks of not working attorney does not want to represent her hearing in October her daughter has significant medical issues and mental health patient says not to let herself worry about this therapist thinks that is a way to cope there is nothing more she can do not going to worry about it for now Therapist asked something positive somebody answer that every day patient said above the ground grasping for air is all she could come up with. Reviewed what she needs to do continue with breathing treatments added also taking a pill to stop menstrual cycle but builds up fluid so can asked to stop that another thing to deal with.  In summary appointment with pulmonary doctor appointment with primary care doctor appointment with pain specialist have the doctors talk to each other. Therapist reviewed session patient finds relief often therapist validating patient able to share what is going on have somebody who understands can be empathetic and validating.  In general processed thoughts and feelings in session to help with coping.  Providing supportive strength-based intervention think it is helpful for therapist to of work with patient for a long time now someone who understands issues and think she has been going through helps value of therapeutic interventions..   Suicidal/Homicidal: No  Plan: Return again in 1 week.Processed thoughts and feelings in session to help with coping therapist's significant support for patient so continue to  listen provide supportive and strength-based intervention.  Diagnosis: Major depressive disorder, recurrent, moderate, generalized anxiety disorder, chronic PTSD, panic attacks  Collaboration of Care: Other none needed  Patient/Guardian was advised Release of Information must be obtained prior to any record release in order to collaborate their care with an outside provider. Patient/Guardian was advised if they have not already done so to contact the registration department to sign all necessary forms in order for us  to release information regarding their care.   Consent: Patient/Guardian gives verbal consent for treatment and assignment of benefits for services provided during this visit. Patient/Guardian expressed understanding and agreed to proceed.   Dallie Duel, LCSW 07/18/2023

## 2023-07-21 ENCOUNTER — Other Ambulatory Visit: Payer: Self-pay

## 2023-07-21 NOTE — Patient Outreach (Addendum)
 Complex Care Management   Visit Note  07/21/2023  Name:  Lindsay [name] MRN: [id number] DOB: Apr 24, 1970  Situation: Referral received for Complex Care Management related to Heart Failure and COPD I obtained verbal consent from Patient.  Visit completed with Lindsay [name]  on the phone. States she is not feeling well today, continues to be short of breath, on 5L O2, reports she had a fever this weekend of 102F, was back down to 36F last night. Also report feeling of sloshing in her abdomen.   Background:   Past Medical History:  Diagnosis Date   Acid reflux    Anemia    Iron  Def   Anorexia    CHF (congestive heart failure) (HCC)    Chronic kidney disease    Nephrotic syndrome   Colon polyp 2009   Depression with anxiety    Edema leg    Fibroid tumor 04/2022   Fibromyalgia    Hemorrhoids    Hidradenitis suppurativa    Hypertension    IBS (irritable bowel syndrome)    Low back pain    Migraines    Morbidly obese (HCC)    Neuromuscular disorder (HCC)    fibromyalgia   Neuropathy    Panic attacks    Polyp of vocal cord or larynx    Stroke (HCC)    Tonsil pain     Assessment: Patient Reported Symptoms:  Cognitive Cognitive Status: Alert and oriented to person, place, and time, Insightful and able to interpret abstract concepts, Normal speech and language skills      Neurological Neurological Review of Symptoms: No symptoms reported    HEENT HEENT Symptoms Reported: Not assessed      Cardiovascular Cardiovascular Symptoms Reported:  (Reports she feels her abdomen is full, when I get up off the bed, I feel fluid sloshing around in my stomach area and I haven't drank anything yet today.) Cardiovascular Conditions: Heart failure Cardiovascular Self-Management Outcome: 3 (uncertain)  Respiratory Respiratory Symptoms Reported: Shortness of breath Respiratory Comment: She is on 5L O2.  Saw Pulmonary 07/09/23, MD aware, ordered Breztri  inhaler, d/c symbicort . Patient states she  has not seen an improvement in her breathing as of yet.  Endocrine Patient reports the following symptoms related to hypoglycemia or hyperglycemia : Not assessed    Gastrointestinal Gastrointestinal Symptoms Reported: Not assessed      Genitourinary Genitourinary Symptoms Reported: Not assessed    Integumentary Integumentary Symptoms Reported: Not assessed    Musculoskeletal Musculoskelatal Symptoms Reviewed: No symptoms reported        Psychosocial Psychosocial Symptoms Reported: Not assessed            05/28/2023    1:32 PM  Depression screen PHQ 2/9  Decreased Interest 1  Down, Depressed, Hopeless 1  PHQ - 2 Score 2  Altered sleeping 2  Tired, decreased energy 2  Change in appetite 1  Feeling bad or failure about yourself  0  Trouble concentrating 1  Moving slowly or fidgety/restless 1  Suicidal thoughts 0  PHQ-9 Score 9  Difficult doing work/chores Somewhat difficult    There were no vitals filed for this visit.  Medications Reviewed Today     Reviewed by Lucian [name] LABOR, RN (Registered Nurse) on 07/21/23 at 0945  Med List Status: <None>   Medication Order Taking? Sig Documenting Provider Last Dose Status Informant  ACCU-CHEK GUIDE test strip [id number]  USE ONE TEST STRIP TO CHECK BLOOD SUGARS ONCE A DAY AS NEEDED Forest Coy, MD  Active Self  Accu-Chek Softclix Lancets lancets [id number]  USE ONE LANCET TO CHECK BLOOD SUGAR ONE A DAY AS NEEDED Forest Coy, MD  Active Self  acetaminophen  (TYLENOL ) 500 MG tablet [id number]  Take 1 tablet (500 mg total) by mouth every 6 (six) hours as needed.  Patient taking differently: Take 500 mg by mouth every 6 (six) hours as needed for mild pain (pain score 1-3).   Charlyn Sora, MD  Active Self  albuterol  (PROVENTIL ) (2.5 MG/3ML) 0.083% nebulizer solution [id number]  Take 3 mLs (2.5 mg total) by nebulization every 6 (six) hours as needed for wheezing or shortness of breath. Guilloud, Carolyn, MD  Active Self   albuterol  (VENTOLIN  HFA) 108 (90 Base) MCG/ACT inhaler [id number]  2 PUFFS INHALED EVERY 6 HOURS AS NEEDED FOR 30 DAYS Olalere, Adewale A, MD  Active   atorvastatin  (LIPITOR) 40 MG tablet [id number]  TAKE 1 TABLET BY MOUTH EVERY DAY Forest Coy, MD  Active Self  Azelastine  HCl 137 MCG/SPRAY SOLN [id number]  PLACE 1 SPRAY INTO BOTH NOSTRILS 2 (TWO) TIMES DAILY. USE IN EACH NOSTRIL AS DIRECTED  Patient taking differently: Place 1 spray into both nostrils daily as needed (for allergies).   Guilloud, Carolyn, MD  Active Self           Med Note (SATTERFIELD, DARIUS E   Fri May 23, 2023  8:27 PM) Still has on hand   Blood Glucose Monitoring Suppl (ACCU-CHEK GUIDE ME) w/Device KIT [id number]  Dispense one device Forest Coy, MD  Active Self  Brexpiprazole  (REXULTI ) 3 MG TABS [id number]  Take 1 tablet (3 mg total) by mouth daily. [name], Syed T, MD  Active Self  budesonide -glycopyrrolate-formoterol  (BREZTRI  AEROSPHERE) 160-9-4.8 MCG/ACT AERO inhaler [id number] Yes Inhale 2 puffs into the lungs in the morning and at bedtime. Neda Jennet LABOR, MD  Active   cetirizine  (ZYRTEC ) 10 MG tablet [id number]  Take 1 tablet (10 mg total) by mouth daily.  Patient taking differently: Take 10 mg by mouth every other day.   Guilloud, Carolyn, MD  Active Self  dicyclomine  (BENTYL ) 10 MG capsule [id number]  Take 1 capsule (10 mg total) by mouth 3 (three) times daily as needed for spasms (AB pain, diarrhea). Craig Alan SAUNDERS, PA-C  Active   diltiazem  (CARDIZEM  CD) 120 MG 24 hr capsule [id number]  Take 1 capsule (120 mg total) by mouth daily. Claudene Pacific, MD  Active   doxepin  (SINEQUAN ) 25 MG capsule [id number]  Take 1 capsule (25 mg total) by mouth at bedtime. [name], Syed T, MD  Active Self  ENTRESTO  24-26 MG [id number]  TAKE 1 TABLET BY MOUTH TWICE A DAY Guilloud, Carolyn, MD  Active Self  ferrous sulfate  325 (65 FE) MG tablet [id number]  Take 1 tablet (325 mg total) by mouth daily with breakfast. Claudene Pacific, MD   Active   fluticasone  (FLONASE ) 50 MCG/ACT nasal spray [id number]  SPRAY 1 SPRAY INTO EACH NOSTRIL DAILY  Patient taking differently: Place 1 spray into both nostrils daily as needed for allergies.   Guilloud, Carolyn, MD  Active Self           Med Note (SATTERFIELD, DARIUS E   Fri May 23, 2023  8:23 PM) Still has on hand   guaiFENesin  (ROBITUSSIN) 100 MG/5ML liquid [id number]  Take 10 mLs by mouth every 6 (six) hours as needed for cough or to loosen phlegm. Raenelle Coria, MD  Active Self           Med Note (SATTERFIELD, DARIUS E   Wed Jun 25, 2023  9:13 PM) Still has on hand   hydrocortisone  (ANUSOL -HC) 2.5 % rectal cream [id number]  Place 1 Application rectally daily as needed for hemorrhoids. [provider]  Active Self           Med Note (SATTERFIELD, DARIUS E   Wed Jun 25, 2023  9:13 PM) Still has on hand   ipratropium (ATROVENT ) 0.06 % nasal spray [id number]  Place 2 sprays into both nostrils daily as needed for rhinitis. [provider]  Active Self           Med Note (SATTERFIELD, DARIUS E   Fri May 23, 2023  8:25 PM) Still has on hand   ipratropium-albuterol  (DUONEB) 0.5-2.5 (3) MG/3ML SOLN [id number]  Take 3 mLs by nebulization 2 (two) times daily. Neda Jennet LABOR, MD  Active   lamoTRIgine  (LAMICTAL ) 100 MG tablet [id number]  Take 0.5 tablets (50 mg total) by mouth 2 (two) times daily. Curry Leni DASEN, MD  Active Self  LINZESS  290 MCG CAPS capsule [id number]  TAKE 1 CAPSULE BY MOUTH DAILY BEFORE BREAKFAST. Craig Alan SAUNDERS, PA-C  Active Self  meclizine  (ANTIVERT ) 25 MG tablet [id number]  Take 25 mg by mouth 3 (three) times daily. [provider]  Active Self  megestrol  (MEGACE ) 40 MG tablet [id number] Yes Take 2 tablets (80 mg total) by mouth daily as needed. Fredirick Glenys RAMAN, MD  Active   metoprolol  succinate (TOPROL -XL) 100 MG 24 hr tablet [id number]  Take 1 tablet (100 mg total) by mouth at bedtime. Claudene Pacific, MD  Active   omeprazole  (PRILOSEC) 40 MG capsule  [id number]  TAKE 1 CAPSULE (40 MG TOTAL) BY MOUTH DAILY. Craig Alan SAUNDERS, PA-C  Active Self  ondansetron  (ZOFRAN -ODT) 8 MG disintegrating tablet [id number]  Take 1 tablet (8 mg total) by mouth every 8 (eight) hours as needed.  Patient taking differently: Take 8 mg by mouth every 8 (eight) hours as needed for nausea or vomiting.   Lovie Clarity, MD  Active Self  oxyCODONE  (OXY IR/ROXICODONE ) 5 MG immediate release tablet [id number]  Take 5 mg by mouth 2 (two) times daily. [provider]  Active Self           Med Note (SATTERFIELD, TEENA BRAVO   Wed Jun 25, 2023  9:16 PM) Patient verified she is taking every day   promethazine  (PHENERGAN ) 25 MG tablet [id number]  Take 1 tablet (25 mg total) by mouth every 6 (six) hours as needed for nausea or vomiting. Craig Alan SAUNDERS DEVONNA  Active   Safety Seal Miscellaneous MISC [id number]  Apply 1 Application topically in the morning. Medication Name; AA Gel Alm Delon SAILOR, DO  Active Self  Semaglutide , 2 MG/DOSE, (OZEMPIC , 2 MG/DOSE,) 8 MG/3ML SOPN [id number]  INJECT 2 MG INTO THE SKIN ONCE A WEEK. Guilloud, Carolyn, MD  Active Self           Med Note (SATTERFIELD, TEENA BRAVO   Fri May 23, 2023  8:02 PM) Take on Fridays  SUMAtriptan  (IMITREX ) 25 MG tablet [id number]  Take 1 tablet (25 mg total) by mouth once for 1 dose. May repeat in 2 hours if headache persists or recurs. Claudene Pacific, MD  Expired 06/27/23 2359   SYMBICORT  160-4.5 MCG/ACT inhaler [id number]  Inhale 2 puffs into the lungs 2 (two) times daily. [provider]  Active Self  tiZANidine  (ZANAFLEX ) 4 MG tablet [id number]  Take 4 mg by mouth every 8 (eight) hours as needed for muscle spasms. [provider]  Active Self  torsemide  (DEMADEX ) 20 MG tablet [id number] Yes TAKE 1 TABLET BY MOUTH TWICE A DAY Guilloud, Carolyn, MD  Active Self            Recommendation:   -Specialty provider follow-up : she will call Cardiology this morning to report increased shortness of breath,  feelings of sloshing in her abdomen, denies increased edema in legs/feet/ankles.   -She saw Pulmonary on 07/09/23, plan is to start on Breztri , stop symbicort .  She has been taking Breztri  but hasn't noticed improved breathing as of yet, she is on 5L O2.  -she will make appt with PCP or go to Urgent Care to be assessed due to running a fever this weekend.   Follow Up Plan:   Telephone follow-up in 1 day  [name] Stamp BSN, CCM [place]  [facility] Population Health RN Care Manager Direct Dial: [phone]  Fax: [phone]

## 2023-07-21 NOTE — Patient Instructions (Signed)
 Visit Information  [name], as we discussed, please call your CARDIOLOGIST to report increased shortness of breath, sloshing feeling in your abdomen.  Call your primary MD to been seen for your fever OR go to URGENT CARE to have your lungs and vitals checked.    Thank you for taking time to visit with me today. Please don't hesitate to contact me if I can be of assistance to you before our next scheduled appointment.  Your next care management appointment is by telephone on Tuesday, June 24th  at 10:45am.   Please call the care guide team at [phone] if you need to cancel, schedule, or reschedule an appointment.   Please call the [place]  National Suicide Prevention Lifeline: 925-545-4006 or TTY: 314-364-0646 TTY (980) 723-4680) to talk to a trained counselor if you are experiencing a Mental Health or Behavioral Health Crisis or need someone to talk to.  Lindsay [name] BSN, CCM [place]  [facility] Population Health RN Care Manager Direct Dial: [phone]  Fax: [phone]

## 2023-07-22 ENCOUNTER — Telehealth: Payer: Self-pay | Admitting: Pulmonary Disease

## 2023-07-22 ENCOUNTER — Encounter: Payer: Self-pay | Admitting: Pulmonary Disease

## 2023-07-22 ENCOUNTER — Other Ambulatory Visit: Payer: Self-pay | Admitting: Pulmonary Disease

## 2023-07-22 ENCOUNTER — Other Ambulatory Visit: Payer: Self-pay

## 2023-07-22 MED ORDER — PREDNISONE 20 MG PO TABS: 20.0000 mg | ORAL_TABLET | Freq: Every day | ORAL | 0 refills | Status: DC

## 2023-07-22 NOTE — Telephone Encounter (Signed)
**Note De-identified  [place] Obfuscation** Please advise 

## 2023-07-22 NOTE — Telephone Encounter (Signed)
 Lindsay [name], can you help me look at whether Lindsay [name] can be scheduled in the next 6 to 8 weeks.  If not, lets just make sure she has an appointment to come up soon

## 2023-07-23 NOTE — Telephone Encounter (Signed)
 Spoke with patient regarding prior message . Patient has been scheduled with Dr.Olalere for a 6-8 week f/u   Patient's voice was understanding.Nothing else further needed.

## 2023-07-25 ENCOUNTER — Ambulatory Visit (INDEPENDENT_AMBULATORY_CARE_PROVIDER_SITE_OTHER): Admitting: Licensed Clinical Social Worker

## 2023-07-25 DIAGNOSIS — F331 Major depressive disorder, recurrent, moderate: Secondary | ICD-10-CM | POA: Diagnosis not present

## 2023-07-25 DIAGNOSIS — F4312 Post-traumatic stress disorder, chronic: Secondary | ICD-10-CM

## 2023-07-25 DIAGNOSIS — F411 Generalized anxiety disorder: Secondary | ICD-10-CM | POA: Diagnosis not present

## 2023-07-25 DIAGNOSIS — F410 Panic disorder [episodic paroxysmal anxiety] without agoraphobia: Secondary | ICD-10-CM | POA: Diagnosis not present

## 2023-07-25 NOTE — Progress Notes (Signed)
 Virtual Visit via Video Note  I connected with Lindsay [name] on 07/25/23 at 10:00 AM EDT by a video enabled telemedicine application and verified that I am speaking with the correct person using two identifiers.  Location: Patient: Passenger in a car Provider: home office   I discussed the limitations of evaluation and management by telemedicine and the availability of in person appointments. The patient expressed understanding and agreed to proceed.   I discussed the assessment and treatment plan with the patient. The patient was provided an opportunity to ask questions and all were answered. The patient agreed with the plan and demonstrated an understanding of the instructions.   The patient was advised to call back or seek an in-person evaluation if the symptoms worsen or if the condition fails to improve as anticipated.  I provided 40 minutes of non-face-to-face time during this encounter.  THERAPIST PROGRESS NOTE  Session Time: 10:00 AM to 10:40 AM  Participation Level: Active  Behavioral Response: CasualAlertAnxious and Dysphoric  Type of Therapy: Individual Therapy  Treatment Goals addressed: Patient reports therapy helps cope with her stressors continue to work on coping strategies to help improve symptoms of her diagnoses, coping in general  ProgressTowards Goals: Progressing-patient has significant medical issues helpful to have an outlet to talk about her stressors therapist provide supportive strength-based intervention  Interventions: Solution Focused, Strength-based, Supportive, and Other: coping  Summary: Lindsay [name] is a 53 y.o. female who presents with at the drug store. Therapist asked what she is getting? One is the inhalers another one she needs. She has 2-3 inhaler 2-3 solutions for nebulizer. Mask put on. Not helping that much. Out of breath just walked to parking spot from drug store. What has been on her mind? Health condition. Both doctors told patient to  tell the doctor to call the other. What has led to nobody calling anybody.Therapist noted shouldn't have the patient do the ground work have the nurse call.  Did some problem-solving noted get a hold of the nurse will take care of things may help facilitate this.  Testing July 24 testing to how bad breathing the doctor wants the lung number, see what the lung function is. Had this test already was moderate not good at all, wants to compare numbers. Therapist noted doctor's don't see to be on the same page. They ask patient to get the doctor's to call each other-Not her job. Therapist asks how makes her mad and sad. What is the next thing juggling so many need to get C-Pap now. Insurance not going to give her one until April. Therapist noted they are not the ones to say need it they are not treating her.  Therapist ask other questions to help focus on important issues patient though given her situation did not have much more to say. How she manages stress she answers she doesn't know how to manage stress. Nothing can say. These are things can't control. Therapist noted the only one who has some control is doctor's. What is her favorite way to relax? can't relax. Explored way to get mind get off of it distractions. Patient said doesn't work constantly thinking about health. What can she do? Get the doctors to call each other. Apply for assistance for C-Pap.  Therapist noted her finding a place to do that very resourceful.  Therapist noted with this is this patient in crisis getting through what needs to get through to get on to next thing.  That is why difficult for her to  elaborate on responses when in crisis.  Therapist asked questions to distract patient couldn't answer can't distract from what is going on. She is in a high state of anxiety. What is she going to do when home? Lung treatments sometimes doesn't help at all. Doesn't see Ledon she calls, do for mom recently can do mom is 19 has a lot of health  issues like blood pressure, back surgery congestive heart failure. Therapist noted wish she could fall asleep patient does too. Not sure who the next doctor appointment is. No plan for weekend. Does pray when remembers. Should be more. What going to do with these hours. Can't lay there all those hours she does.  Therapist notes.  The only distraction she gets is therapy sessions so helpful for that reason also in looking at questions therapist asked her who listens and she does think therapist listens.  Given significant stressors therapist assesses very important for her to have therapeutic connection for her to process feelings to be validated for supportive interventions to have someone know what is going on who cares and wants patient to do what she can to move forward in addressing her issues.  Suicidal/Homicidal: No  Plan: Return again in 1 week.2.  Continue to process thoughts and feelings to help with coping  Diagnosis: Major depressive disorder, recurrent, moderate, generalized anxiety disorder, chronic PTSD, panic attacks  Collaboration of Care: Other none needed  Patient/Guardian was advised Release of Information must be obtained prior to any record release in order to collaborate their care with an outside provider. Patient/Guardian was advised if they have not already done so to contact the registration department to sign all necessary forms in order for us  to release information regarding their care.   Consent: Patient/Guardian gives verbal consent for treatment and assignment of benefits for services provided during this visit. Patient/Guardian expressed understanding and agreed to proceed.   Ronal Sink, LCSW 07/25/2023

## 2023-07-28 ENCOUNTER — Other Ambulatory Visit: Payer: Self-pay

## 2023-07-28 NOTE — Patient Instructions (Addendum)
 Visit Information  Thank you for taking time to visit with me today. Please don't hesitate to contact me if I can be of assistance to you before our next scheduled appointment.  Your next care management appointment is by telephone: Monday, July 28th at 10:30am.     Please call the care guide team at [phone] if you need to cancel, schedule, or reschedule an appointment.   A reminder to ALL patients/family/friends, please call the [place]  National Suicide Prevention Lifeline: (636)187-7042 or TTY: (929)009-7131 TTY (818) 461-2943) to talk to a trained counselor if you are experiencing a Mental Health or Behavioral Health Crisis or need someone to talk to.  [name] Stamp BSN, CCM [place]  VBCI Population Health RN Care Manager Direct Dial: [phone]  Fax: [phone]

## 2023-07-28 NOTE — Patient Outreach (Signed)
 Complex Care Management   Visit Note  07/28/2023  Name:  Lindsay [name] MRN: [id number] DOB: Jul 07, 1970  Situation: Referral received for Complex Care Management related to Heart Failure and COPD I obtained verbal consent from Patient.  Visit completed with [name]  on the phone. Call was brief due to call cut off, not able to reach patient when calling back, left voicemail for return call.  While on the call, patient states, I'm feeling much better than last week. She is taking prednisone  as ordered by Pulmonology week of 07/21/23.   Background:   Past Medical History:  Diagnosis Date   Acid reflux    Anemia    Iron  Def   Anorexia    CHF (congestive heart failure) (HCC)    Chronic kidney disease    Nephrotic syndrome   Colon polyp 2009   Depression with anxiety    Edema leg    Fibroid tumor 04/2022   Fibromyalgia    Hemorrhoids    Hidradenitis suppurativa    Hypertension    IBS (irritable bowel syndrome)    Low back pain    Migraines    Morbidly obese (HCC)    Neuromuscular disorder (HCC)    fibromyalgia   Neuropathy    Panic attacks    Polyp of vocal cord or larynx    Stroke (HCC)    Tonsil pain     Assessment: Patient Reported Symptoms:  Cognitive Cognitive Status: Alert and oriented to person, place, and time, Normal speech and language skills      Neurological Neurological Review of Symptoms: Not assessed    HEENT HEENT Symptoms Reported: Not assessed      Cardiovascular Cardiovascular Symptoms Reported: Not assessed    Respiratory Respiratory Symptoms Reported: Other: Other Respiratory Symptoms: Reports shortness of breath has improved, feeling better now she is taking Prednisone  as prescribed by Pulmonolgy. Respiratory Self-Management Outcome: 3 (uncertain)  Endocrine Endocrine Symptoms Reported: Not assessed    Gastrointestinal Gastrointestinal Symptoms Reported: Not assessed      Genitourinary Genitourinary Symptoms Reported: Not assessed     Integumentary Integumentary Symptoms Reported: Not assessed    Musculoskeletal Musculoskelatal Symptoms Reviewed: Not assessed        Psychosocial Psychosocial Symptoms Reported: Not assessed            05/28/2023    1:32 PM  Depression screen PHQ 2/9  Decreased Interest 1  Down, Depressed, Hopeless 1  PHQ - 2 Score 2  Altered sleeping 2  Tired, decreased energy 2  Change in appetite 1  Feeling bad or failure about yourself  0  Trouble concentrating 1  Moving slowly or fidgety/restless 1  Suicidal thoughts 0  PHQ-9 Score 9  Difficult doing work/chores Somewhat difficult    There were no vitals filed for this visit.  Medications Reviewed Today   Medications were not reviewed in this encounter     Recommendation:   Take Prednisone  as prescribed by Pulmonolgy  Follow Up Plan:   Telephone follow-up two weeks.  [name] Stamp BSN, CCM [place]  VBCI Population Health RN Care Manager Direct Dial: [phone]  Fax: [phone]

## 2023-07-30 ENCOUNTER — Ambulatory Visit (INDEPENDENT_AMBULATORY_CARE_PROVIDER_SITE_OTHER): Admitting: Licensed Clinical Social Worker

## 2023-07-30 DIAGNOSIS — F331 Major depressive disorder, recurrent, moderate: Secondary | ICD-10-CM | POA: Diagnosis not present

## 2023-07-30 DIAGNOSIS — F4312 Post-traumatic stress disorder, chronic: Secondary | ICD-10-CM | POA: Diagnosis not present

## 2023-07-30 DIAGNOSIS — F410 Panic disorder [episodic paroxysmal anxiety] without agoraphobia: Secondary | ICD-10-CM

## 2023-07-30 DIAGNOSIS — F411 Generalized anxiety disorder: Secondary | ICD-10-CM | POA: Diagnosis not present

## 2023-07-30 NOTE — Progress Notes (Signed)
 Virtual Visit via Video Note  I connected with Lindsay [name] on 07/30/23 at  9:00 AM EDT by a video enabled telemedicine application and verified that I am speaking with the correct person using two identifiers.  Location: Patient: home Provider: home office   I discussed the limitations of evaluation and management by telemedicine and the availability of in person appointments. The patient expressed understanding and agreed to proceed.   I discussed the assessment and treatment plan with the patient. The patient was provided an opportunity to ask questions and all were answered. The patient agreed with the plan and demonstrated an understanding of the instructions.   The patient was advised to call back or seek an in-person evaluation if the symptoms worsen or if the condition fails to improve as anticipated.  I provided 20 minutes of non-face-to-face time during this encounter.   THERAPIST PROGRESS NOTE  Session Time: 9:00 AM to 9:20 AM  Participation Level: Active  Behavioral Response: CasualAlertpatient struggling with breathing mood dysphoric  Type of Therapy: Individual Therapy  Treatment Goals addressed: Patient reports therapy helps cope with her stressors continue to work on coping strategies to help improve symptoms of her diagnoses, coping in general  ProgressTowards Goals: Progressing-therapy is a significant support progressing because helps her with coping as she deals with difficult medical issues therapist continues to provide interventions that are helpful such as patient needs to to go in the hospital also emphasizing her strengths as a resource that will help with healing  Interventions: Solution Focused, Strength-based, Supportive, and Other: coping  Summary: Lindsay [name] is a 53 y.o. female who presents with short of breath. She has a mask underneath the mask is oxygen , on top is the breathing treatment. Since yesterday in morning trouble with breathing always  has the oxygen  on but not the breathing treatment do every 6 hours and also has inhalers.  Therapist asked if it helps and she relates doesn't know the difference.  Therapist asked about her day encouraged her to rest may help with breathing patient says she is going to make the bed, go the store. What going to store for? Some rice.  Therapist notices her strength and resilience to push through how that actually helps the resilience with symptoms.  Therapist showed her briefly seeking safety coping skills emphasizing when she sees them patient to never never never never never never never never give up.  Therapist emphasizing this is key to getting well. Therapist started to mention a couple more such as cry if you need to what last forever do the best with with what you have.  Did not elaborate will elaborate further patient is short of breath and therapist does not want engage her in conversation.  Therapist also used session to talk about some options she thinks about that could be helpful has a relative who had breathing problems went into the hospital it was a long process there was healing though to provide hope for the patient.  Therapist sees this is a safety net and that is a positive things if it gets that bad could go in the hospital and therapist has seen the track going to the hospital has worked a can take a long time to get better.  Agree with patient's frustration of the slow movement of things doctors are going to do can be frustrating.  Processed thoughts and feelings in session patient relies on therapy as a main support we both laughed when we recall she was in an  ambulance and still needed her therapy so she did go into the hospital she would do therapy a nice later moment therapist pointing out probably the only time she has done therapy in a ambulance.  That got patient to laugh as well.  Therapist continues to emphasize patient's strengths resources resiliency to help her with coping with  significant stressors..   Suicidal/Homicidal: No  Plan: Return again in 2 weeks.2.  Look at seeking safety coping skills processed thoughts and feelings in session help with coping therapist's significant support helps her with her stressors  Diagnosis: Major depressive disorder, recurrent, moderate, generalized anxiety disorder, chronic PTSD, panic attacks  Collaboration of Care: Other none needed  Patient/Guardian was advised Release of Information must be obtained prior to any record release in order to collaborate their care with an outside provider. Patient/Guardian was advised if they have not already done so to contact the registration department to sign all necessary forms in order for us  to release information regarding their care.   Consent: Patient/Guardian gives verbal consent for treatment and assignment of benefits for services provided during this visit. Patient/Guardian expressed understanding and agreed to proceed.   Ronal Sink, LCSW 07/30/2023

## 2023-08-05 ENCOUNTER — Other Ambulatory Visit (HOSPITAL_COMMUNITY): Payer: Self-pay

## 2023-08-05 DIAGNOSIS — F4312 Post-traumatic stress disorder, chronic: Secondary | ICD-10-CM

## 2023-08-05 DIAGNOSIS — F331 Major depressive disorder, recurrent, moderate: Secondary | ICD-10-CM

## 2023-08-05 MED ORDER — DOXEPIN HCL 25 MG PO CAPS
25.0000 mg | ORAL_CAPSULE | Freq: Every day | ORAL | 0 refills | Status: DC
Start: 2023-08-05 — End: 2023-08-29

## 2023-08-05 MED ORDER — LAMOTRIGINE 100 MG PO TABS
50.0000 mg | ORAL_TABLET | Freq: Two times a day (BID) | ORAL | 0 refills | Status: DC
Start: 2023-08-05 — End: 2023-08-29

## 2023-08-05 MED ORDER — REXULTI 3 MG PO TABS: 3.0000 mg | ORAL_TABLET | Freq: Every day | ORAL | 0 refills | Status: DC

## 2023-08-06 ENCOUNTER — Ambulatory Visit (INDEPENDENT_AMBULATORY_CARE_PROVIDER_SITE_OTHER): Admitting: Licensed Clinical Social Worker

## 2023-08-06 DIAGNOSIS — F411 Generalized anxiety disorder: Secondary | ICD-10-CM

## 2023-08-06 DIAGNOSIS — F331 Major depressive disorder, recurrent, moderate: Secondary | ICD-10-CM

## 2023-08-06 DIAGNOSIS — F4312 Post-traumatic stress disorder, chronic: Secondary | ICD-10-CM | POA: Diagnosis not present

## 2023-08-06 DIAGNOSIS — F410 Panic disorder [episodic paroxysmal anxiety] without agoraphobia: Secondary | ICD-10-CM

## 2023-08-06 NOTE — Progress Notes (Signed)
 Virtual Visit via Video Note  I connected with Crytal Jarriel on 08/06/23 at  2:00 PM EDT by a video enabled telemedicine application and verified that I am speaking with the correct person using two identifiers.  Location: Patient: home Provider: home office   I discussed the limitations of evaluation and management by telemedicine and the availability of in person appointments. The patient expressed understanding and agreed to proceed.   I discussed the assessment and treatment plan with the patient. The patient was provided an opportunity to ask questions and all were answered. The patient agreed with the plan and demonstrated an understanding of the instructions.   The patient was advised to call back or seek an in-person evaluation if the symptoms worsen or if the condition fails to improve as anticipated.  I provided 40 minutes of non-face-to-face time during this encounter.  THERAPIST PROGRESS NOTE  Session Time: 2:00 PM to 2:40 PM  Participation Level: Active  Behavioral Response: CasualAlertAnxious  Type of Therapy: Individual Therapy  Treatment Goals addressed: Patient reports therapy helps cope with her stressors continue to work on coping strategies to help improve symptoms of her diagnoses, coping in general  ProgressTowards Goals: Progressing-nurse clinician and with patient while we are doing her therapy session and assess helpful for treatment intervention as she sees things need to be addressed more quickly with patient's issues and will talk to doctors assess patient responding by being listened to which provides some help  Interventions: Solution Focused, Strength-based, Supportive, and Other: coping  Summary: Keturah Yerby is a 53 y.o. female who presents with [facility] nurse is here to do an annual month/biannual session.  She talked to patient yesterday can see breathing issues she says need to come up with some answers. Need to be seen in office sooner.  She got 5 liter oxygen  yesterday out of breath. Something going on needs to look at meds work up, Nurse practitioner says if don't hear from doctors after she talks to them to call 911 they will give her a work up, Need something like CT to look at lung to get to bottom of what is going on. March April is when the heavy breathing started. Patient has COPD asthma having a hard time nurse says need to do something with medicine not just 5 liters of oxygen  not enough for what is going on. Patient's pulmonologist at [facility]. PCP not spending time to deal with health concerns patient feels. Nurse said interventions could be prednisone  for breathing, treatment, change meds, work up. Get Info to pulmonologist. The nurse recommends if can't see pulmonologist after talking to her yesterday there is a big difference from last year big change in her breathing if needs to advocate for herself call 911 if need to. May sit in ER for awhile see her for the work up. Patient says doesn't want to be dependent on oxygen . Waiting for breathing function test why everything on hold nurse says can't wait until then. Episode again go to ER. Doctors need to check on her, makes sense to nurse, not ignore her this has been ongoing and tell the doctors that too. Nurse is going to communicate this to doctors. Needs  investigation not just exacerbation. Hospital stays one in April in May because heart cauterization to do blood work and paperwork day before procedure and kept for observation. Doctors think could be weight patient says 400 lbs never be out of breath like this. Two puffs twice a day. Albuterol  solution in  nebulizer need a higher dose. Anxiety crazy worried about everything things like internal bleeding and doctor saying keep an eye on that not reassuring. All these medical issues. Relieve anxiety talking to therapist, praying. Feeling of not breathing will cause anxiety. Patient said could cause a panic attack. It was crazy her  breathing took a breathing treatment and stopped an hour later. Mom is with her. Carrying water and cantaloupes this morning out of breath. Yesterday was out of breath but not moving at all. Past three months two ER visits. Take care of breathing priority.  Patient says taking a bath needs to do it even when it is not there and brings oxygen  in with her. Nurse says doesn't want her to be passed out so bring it with her. Every day depressed, hopeless, lost pleasure several days. Not able to sleep. Back, knee hurting sometimes breathing. Worry about breathing not going to sleep concern about health keeps her up. Energy is low doesn't have any.  Nurse asked her about an echocardiogram at the end whether she has had 1 she said something like liquid build up could relate to not breathing.  Therapist thought helpful to be in session felt a good treatment intervention was happening as nurses finding out more and wanting to intervene to help patient  Assess helpful nurse practitioner in session therapist stayed while she was doing either annual or biannual assessment.  Helpful because she realizes how serious patient's mental health symptoms and they are directly related to her medical issues.  Patient told her what is going on and she verbalized she does not think the doctors are listening and she will call them and say to them she needs to be seen right away see what is going on assess this is a very helpful intervention as a nurse practitioner may help get things moving for patient.  Therapist assesses the way nurse practitioner is doctors not listening something needs done more rapid.  Also good advice to go to emergency room if out of breath for no reason like yesterday therapist pointed out to patient she seems mood more bright patient agrees related to having somebody who seems to know about medical issues and willing to speak up about it.  Therapist spoke up about patient's anxiety how she cannot find any way to  help decrease it so it has had a high level only thing that helps is talking to therapist related to surround worried about her medical issues different ones such as internal breathing not being able to breathe.  Other way patient copes is praying hopefully more medical interventions will help her with symptoms     Suicidal/Homicidal: No  Plan: Return again in 3 weeks.2.  Processed thoughts and feelings in session help with supportive interventions help with coping with significant anxiety  Diagnosis: Major depressive disorder, recurrent, moderate, generalized anxiety disorder, chronic PTSD, panic attacks  Collaboration of Care: Other none needed  Patient/Guardian was advised Release of Information must be obtained prior to any record release in order to collaborate their care with an outside provider. Patient/Guardian was advised if they have not already done so to contact the registration department to sign all necessary forms in order for us  to release information regarding their care.   Consent: Patient/Guardian gives verbal consent for treatment and assignment of benefits for services provided during this visit. Patient/Guardian expressed understanding and agreed to proceed.   Ronal Sink, LCSW 08/06/2023

## 2023-08-07 ENCOUNTER — Other Ambulatory Visit: Payer: Self-pay

## 2023-08-07 ENCOUNTER — Telehealth: Payer: Self-pay

## 2023-08-07 NOTE — Telephone Encounter (Signed)
 Copied from CRM [id number]. Topic: Clinical - Medical Advice >> Aug 06, 2023  3:41 PM Russell PARAS wrote: Reason for CRM:   Alfredia, an NP with [employer] through [employer], is contacting clinic regarding symptoms pt is experiencing.  She spoke with pt yesterday, when the pt sounded out of breath and was gasping for air. The NP asked if she was doing any strenuous activity prior to her call, pt denied and said she was laying in bed resting. Pt reports she has intermittent episodes where she feels she is gasping for air. Pt reported she is currently on 5 Ls of oxygen  continuously throughout the day, using Breztri  2 x day, and using nebulizer/albuterol  several times a day as well. With all treatments she is still experiencing these episodes. Soni spoke with pt again today and the pt reported she was not experiencing any breathing issues. She advised the pt if she began to experience another episode to seek care at the ER.   Alfredia is concerned and believes she may need to be seen sooner than the scheduled 08/19 visit w/provider. Pt was admitted to hospital in 04/2023 due to significant breathing issues and 05/2023 for cardiac issues. Of note pt has required several courses of prednisone  in the past few months.   If provider has any questions concerning her visit with the pt, Soni's CB# is   [phone], is a secure line and can be contacted if needed.       Spoke w/ pt she did not request for NP from [facility] to reach out to us . And she would rather keep her appt. W/ Dr. MALVA .  Advise her if she feels the need to be seen sooner please reach out to us .   NFN

## 2023-08-12 ENCOUNTER — Other Ambulatory Visit: Payer: Self-pay | Admitting: Pulmonary Disease

## 2023-08-12 ENCOUNTER — Other Ambulatory Visit: Payer: Self-pay | Admitting: Physician Assistant

## 2023-08-12 MED ORDER — ALBUTEROL SULFATE (2.5 MG/3ML) 0.083% IN NEBU
2.5000 mg | INHALATION_SOLUTION | Freq: Four times a day (QID) | RESPIRATORY_TRACT | 0 refills | Status: DC | PRN
Start: 2023-08-12 — End: 2023-09-02

## 2023-08-12 MED ORDER — IPRATROPIUM-ALBUTEROL 0.5-2.5 (3) MG/3ML IN SOLN: 3.0000 mL | Freq: Two times a day (BID) | RESPIRATORY_TRACT | 1 refills | Status: DC

## 2023-08-13 ENCOUNTER — Ambulatory Visit (INDEPENDENT_AMBULATORY_CARE_PROVIDER_SITE_OTHER): Admitting: Licensed Clinical Social Worker

## 2023-08-13 DIAGNOSIS — F4312 Post-traumatic stress disorder, chronic: Secondary | ICD-10-CM

## 2023-08-13 DIAGNOSIS — F411 Generalized anxiety disorder: Secondary | ICD-10-CM | POA: Diagnosis not present

## 2023-08-13 DIAGNOSIS — F331 Major depressive disorder, recurrent, moderate: Secondary | ICD-10-CM

## 2023-08-13 DIAGNOSIS — F410 Panic disorder [episodic paroxysmal anxiety] without agoraphobia: Secondary | ICD-10-CM | POA: Diagnosis not present

## 2023-08-13 NOTE — Progress Notes (Signed)
 More than 50% of session done by phone due to technological difficulties  Virtual Visit via Video Note  I connected with Lindsay [name] on 08/13/23 at  8:00 AM EDT by a video enabled telemedicine application and verified that I am speaking with the correct person using two identifiers.  Location: Patient: home Provider: home office   I discussed the limitations of evaluation and management by telemedicine and the availability of in person appointments. The patient expressed understanding and agreed to proceed.   I discussed the assessment and treatment plan with the patient. The patient was provided an opportunity to ask questions and all were answered. The patient agreed with the plan and demonstrated an understanding of the instructions.   The patient was advised to call back or seek an in-person evaluation if the symptoms worsen or if the condition fails to improve as anticipated.  I provided 40 minutes of non-face-to-face time during this encounter.  THERAPIST PROGRESS NOTE  Session Time: 8:00 AM to 8:40 AM  Participation Level: Active  Behavioral Response: CasualAlertAnxious and Dysphoric  Type of Therapy: Individual Therapy  Treatment Goals addressed: Patient reports therapy helps cope with her stressors continue to work on coping strategies to help improve symptoms of her diagnoses, coping in general  ProgressTowards Goals: Progressing-medical issues continue to be main issue therapist being an important support so assist therapeutic for patient to utilize session for that support strength-based as well as processing thoughts and feelings in session to help her cope and be proactive of connecting with medical resources  Interventions: Solution Focused, Strength-based, Supportive, and Other: coping  Summary: Teasha Murrillo is a 53 y.o. female who presents with chills and high temperature body aches.  It has been like that since catheterization a month and a half ago goes on and  off.  She is thinking of emergency room she is about 50-50 therapist encourages her to go symptoms need addressed specially with her having different medical conditions her aide could take her.  This is the second night like this therapist was encouraged by the nurse who visited her last week who knew something needed done she has followed up with patient and got a call from the lung doctor already wants to make an appointment although not made yet the receptionist has to check with Dr. To see when scheduled so again is being addressed at a slower pace but still glad there is some involvement by a nurse practitioner from insurance who sees patients condition see this is different from the way it was/year not just a escalation of symptoms with something different.  Patient does not know about Dr Even the nurse last week noted at a lower dosage than she was before for her nebulizer therapist noted she gets for patients coming from but at this point needs to see any doctor something sooner than later she can consider other options in the meantime but this doctor will see her sooner probably then other doctor she may consider can look at that for longer-term or while she is also being seen by this doctor.  Noted some positive for therapist that not having trouble with breathing this has been a serious issue glad to hear that she has got some relief from that always looking for some positive along with the struggle she is having.  Patient utilizes session as a main support for her somebody who is an advocate for her encouraging her to utilize health resources even though she gets discouraged therapist connecting with medical  resources are her best option of alleviating her symptoms.  Noted the positive of the nurse coming last week to get things moving reinforcing this is a very positive thing in the process of helping her connect with resources..   Suicidal/Homicidal: No  Plan: Return again in 3 weeks.2.Processed  thoughts and feelings in session help with supportive interventions help with coping with significant anxiety  Diagnosis: Major depressive disorder, recurrent, moderate, generalized anxiety disorder, chronic PTSD, panic attacks  Collaboration of Care: Other none needed  Patient/Guardian was advised Release of Information must be obtained prior to any record release in order to collaborate their care with an outside provider. Patient/Guardian was advised if they have not already done so to contact the registration department to sign all necessary forms in order for us  to release information regarding their care.   Consent: Patient/Guardian gives verbal consent for treatment and assignment of benefits for services provided during this visit. Patient/Guardian expressed understanding and agreed to proceed.   Ronal Sink, LCSW 08/13/2023

## 2023-08-15 ENCOUNTER — Ambulatory Visit (HOSPITAL_COMMUNITY): Admitting: Licensed Clinical Social Worker

## 2023-08-22 ENCOUNTER — Ambulatory Visit: Admitting: Pulmonary Disease

## 2023-08-22 ENCOUNTER — Ambulatory Visit (INDEPENDENT_AMBULATORY_CARE_PROVIDER_SITE_OTHER): Admitting: Licensed Clinical Social Worker

## 2023-08-22 DIAGNOSIS — R0602 Shortness of breath: Secondary | ICD-10-CM

## 2023-08-22 DIAGNOSIS — F4312 Post-traumatic stress disorder, chronic: Secondary | ICD-10-CM | POA: Diagnosis not present

## 2023-08-22 DIAGNOSIS — F331 Major depressive disorder, recurrent, moderate: Secondary | ICD-10-CM | POA: Diagnosis not present

## 2023-08-22 DIAGNOSIS — F411 Generalized anxiety disorder: Secondary | ICD-10-CM | POA: Diagnosis not present

## 2023-08-22 DIAGNOSIS — F410 Panic disorder [episodic paroxysmal anxiety] without agoraphobia: Secondary | ICD-10-CM

## 2023-08-22 LAB — PULMONARY FUNCTION TEST
DL/VA % pred: 77 %
DL/VA: 3.14 ml/min/mmHg/L
DLCO cor % pred: 52 %
DLCO cor: 13.72 ml/min/mmHg
DLCO unc % pred: 52 %
DLCO unc: 13.72 ml/min/mmHg
FEF 25-75 Post: 0.47 L/s
FEF 25-75 Pre: 1.14 L/s
FEF2575-%Change-Post: -58 %
FEF2575-%Pred-Post: 15 %
FEF2575-%Pred-Pre: 37 %
FEV1-%Change-Post: -15 %
FEV1-%Pred-Post: 44 %
FEV1-%Pred-Pre: 53 %
FEV1-Post: 1.54 L
FEV1-Pre: 1.82 L
FEV1FVC-%Change-Post: 0 %
FEV1FVC-%Pred-Pre: 91 %
FEV6-%Change-Post: -15 %
FEV6-%Pred-Post: 49 %
FEV6-%Pred-Pre: 58 %
FEV6-Post: 2.1 L
FEV6-Pre: 2.5 L
FEV6FVC-%Change-Post: 0 %
FEV6FVC-%Pred-Post: 102 %
FEV6FVC-%Pred-Pre: 102 %
FVC-%Change-Post: -16 %
FVC-%Pred-Post: 47 %
FVC-%Pred-Pre: 57 %
FVC-Post: 2.1 L
FVC-Pre: 2.52 L
Post FEV1/FVC ratio: 73 %
Post FEV6/FVC ratio: 100 %
Pre FEV1/FVC ratio: 72 %
Pre FEV6/FVC Ratio: 99 %
RV % pred: 121 %
RV: 2.67 L
TLC % pred: 88 %
TLC: 5.4 L

## 2023-08-22 NOTE — Progress Notes (Signed)
 Virtual Visit via Video Note  I connected with Lindsay [name] on 08/22/23 at  8:00 AM EDT by a video enabled telemedicine application and verified that I am speaking with the correct person using two identifiers.  Location: Patient: home Provider: home office   I discussed the limitations of evaluation and management by telemedicine and the availability of in person appointments. The patient expressed understanding and agreed to proceed.  History of Present Illness:    Observations/Objective:   Assessment and Plan:   Follow Up Instructions:    I discussed the assessment and treatment plan with the patient. The patient was provided an opportunity to ask questions and all were answered. The patient agreed with the plan and demonstrated an understanding of the instructions.   The patient was advised to call back or seek an in-person evaluation if the symptoms worsen or if the condition fails to improve as anticipated.  I provided 40 minutes of non-face-to-face time during this encounter.   THERAPIST PROGRESS NOTE  Session Time: 8:00 AM to 8:40 AM  Participation Level: Active  Behavioral Response: CasualAlertAnxious  Type of Therapy: Individual Therapy  Treatment Goals addressed:  Patient reports therapy helps cope with her stressors continue to work on coping strategies to help improve symptoms of her diagnoses, coping in general  ProgressTowards Goals: Progressing-patient has ongoing medical issues assess helpful for therapist to have inside information as continue to follow patient closely this offers her supportive and strength-based interventions as well as a place to process thoughts and feelings to help with coping  Interventions: Solution Focused, Strength-based, Supportive, Reframing, and Other: coping  Summary: Lindsay [name] is a 53 y.o. female who presents with feels like crap, went to emergency room 4-5 hours gasping for air a lot of people ahead so left. Doesn't  know what is triggering the breathing thing. Breathing issues and a little coughing.  Went to primary care said was a crazy experience sent in a lady with oxygen . Patient was there not for breathing but for  fever and chills, lab work abnormal, temp 99 degrees system off feels the effect. The doctor cared about breathing and blood work and he was out. He called her yesterday and misunderstood. It was crazy. Ask about coughing and trouble in breathing, he wants an x-ray if keep coughing have been coughing all weekend patient asked shouldn't that be enough. Probably go. Pulmonary doctor changed the dosage of the nebulizer has a function pulmonary test 9:15 AM. Can't see him until August 19. Patient says she feels passed around and nobody doing anything. Therapist can see that. 3 AM breathing terrible. Don't know what they are waiting on? Can't do a test where they put you to sleep. Excited about appointments but then nothing happens. [facility] scholarship program they will pay if appointment with therapist same day as doctor want to look into that. Have to go to hematologist supposed to get iron  infusion, has to talk to cancer doctor what are they saying?  Does look briefly at notes not in-depth not sure why? Reviewing her diagnosis a lot of  breathing issues but as patient says not really doing anything. At least try something patient says. If don't hear will contact doctor through my chart. With doctors patient says If don't know ask. Feels check for doctors even doctor's of color. Africans don't act like the way her doctors are acting makes her feel don't care she is just a check. Aid is taking her to the appointment takes to appointment  but nothing else but sleep. Don't want to see that so wakes her up tell her need to go somewhere other wise don't say anything. She is supposed to do housework and help in shower. Make sure clean after, make her bed. About a point to say something. The transportation is big though.  Right now doctor's appointment is the priority in pain but focusing on gasping for air when breath hurting in back, sharp pain in back where put numbing medicine, other meds inhale when breath hurts where injection is and injection close to lungs, muscle and nerve where meds at. Supposed to go but chill and temperature they said not to come. She is getting back injections done [facility] with pain management. Pain doctor saw on June 3. Had impression had to go every month, going to make appointment.  Talked about going to do things in [place] going when younger to science center and plantarium. That is inspiring to do things like that. Talked about plantarium sitting under stars was relaxing and fell asleep.       Patient has a lot of stressors and issues with doctors medical system.  Therapist can see what she is saying at the same time reframing some things that glad to hear her primary doctor is going to get x-ray of lung going for lung function test these are priority things she needs right now her breathing needing to be addressed so positive these appointments are about these issues.  As patient says which they would do something and therapist agrees coming up with a plan patient does have a lot of breathing issues so remain hopeful these appointments will move things forward patient will also be proactive check back with doctors to help get things moving which may be needed in this case.  As noted goal of getting her better so she can start to enjoy things again which will be really good for her mental health talked about things she enjoyed such as planetarium and science center those kind of things or anything else she would find enjoyable could be being outside enjoying the outdoors.  Talked about her frustration with aid at the same time priority to get to appointments right now therapist thinks and patient agrees.  Assessed positive patient was getting to appointments and that she has appointments as main  issue right now are her medical issues hopefully these appointments will get things moving for her.  Therapist processed thoughts and feelings in session help with coping also positive through conversation noting some of the positive people patient is encountered in health system somebody who actually went into a grocery store for her to buy her family things.  Noting truly blessed work when people do things without sharing with everybody do just to do good things Suicidal/Homicidal: No  Plan: Return again in 2 weeks.2. Processe thoughts and feelings in session help with supportive interventions help with coping with significant anxiety  Diagnosis: .Major depressive disorder, recurrent, moderate, generalized anxiety disorder, chronic PTSD, panic attacks   Collaboration of Care: Other none needed  Patient/Guardian was advised Release of Information must be obtained prior to any record release in order to collaborate their care with an outside provider. Patient/Guardian was advised if they have not already done so to contact the registration department to sign all necessary forms in order for us  to release information regarding their care.   Consent: Patient/Guardian gives verbal consent for treatment and assignment of benefits for services provided during this visit. Patient/Guardian expressed understanding  and agreed to proceed.   Ronal Sink, LCSW 08/22/2023

## 2023-08-22 NOTE — Progress Notes (Signed)
 Full PFT performed today.

## 2023-08-22 NOTE — Patient Instructions (Signed)
 Full PFT performed today.

## 2023-08-24 ENCOUNTER — Other Ambulatory Visit: Payer: Self-pay | Admitting: Physician Assistant

## 2023-08-24 DIAGNOSIS — A084 Viral intestinal infection, unspecified: Secondary | ICD-10-CM

## 2023-08-25 ENCOUNTER — Other Ambulatory Visit: Payer: Self-pay

## 2023-08-25 ENCOUNTER — Telehealth: Payer: Self-pay

## 2023-08-25 NOTE — Patient Instructions (Signed)
 Visit Information  Thank you for taking time to visit with me today. Please don't hesitate to contact me if I can be of assistance to you before our next scheduled appointment.  Your next care management appointment is by telephone on Monday, August 11th at 10:30am.    Please call the care guide team at [phone] if you need to cancel, schedule, or reschedule an appointment.   A reminder to ALL patients/family/friends, please call the [place]  National Suicide Prevention Lifeline: 502-808-8939 or TTY: (315)246-7707 TTY 279-300-9931) to talk to a trained counselor if you are experiencing a Mental Health or Behavioral Health Crisis or need someone to talk to.  [name] Stamp BSN, CCM [place]  [facility] Population Health RN Care Manager Direct Dial: [phone]  Fax: [phone]

## 2023-08-25 NOTE — Patient Outreach (Signed)
 Called [facility], left message for return call on nurse line regarding [facility] having difficulty getting response from office about refilling ondansetron  8mg  disintegrating tablets.

## 2023-08-25 NOTE — Patient Outreach (Signed)
 Complex Care Management   Visit Note  08/25/2023  Name:  Lindsay [name] MRN: [id number] DOB: 01-21-1971  Situation: Referral received for Complex Care Management related to Heart Failure and COPD I obtained verbal consent from Patient.  Visit completed with Lindsay [name]  on the phone. Main concern is increased shortness of breath, she is using 5L O2, taking inhalers as prescribed.  She is awaiting lab work to be read by PCP taken approx 08/22/23 regarding her SHOB.  Awaiting results of PFT taken 08/22/23 ordered by Pulmonary. Needs new CPAP due to not being able to locate her machine after a move/storage. Would like Ondansetron  disintegrating tablets instead of IR tabs.   Background:   Past Medical History:  Diagnosis Date   Acid reflux    Anemia    Iron  Def   Anorexia    CHF (congestive heart failure) (HCC)    Chronic kidney disease    Nephrotic syndrome   Colon polyp 2009   Depression with anxiety    Edema leg    Fibroid tumor 04/2022   Fibromyalgia    Hemorrhoids    Hidradenitis suppurativa    Hypertension    IBS (irritable bowel syndrome)    Low back pain    Migraines    Morbidly obese (HCC)    Neuromuscular disorder (HCC)    fibromyalgia   Neuropathy    Panic attacks    Polyp of vocal cord or larynx    Stroke (HCC)    Tonsil pain     Assessment: Patient Reported Symptoms:  Cognitive Cognitive Status: Alert and oriented to person, place, and time, Insightful and able to interpret abstract concepts, Normal speech and language skills      Neurological Neurological Review of Symptoms: Weakness    HEENT HEENT Symptoms Reported: Not assessed      Cardiovascular Cardiovascular Symptoms Reported: Fatigue    Respiratory Respiratory Symptoms Reported: Shortness of breath Other Respiratory Symptoms: Patient is having increased shortness of breath, had PFT on 7/25, saw PCP on 7/23 for iron /CBS labs, awaiting results.  Taking Breztri , nebulizer, inhalers as ordered, has had to  turn her oxygen  up to 5L this morning.  she has appt with Oncology on 8/5. Respiratory Self-Management Outcome: 3 (uncertain)  Endocrine Endocrine Symptoms Reported: Not assessed    Gastrointestinal Gastrointestinal Symptoms Reported: Nausea (Occasional nausea, needs refill for ondansetron  disintegrating tablets, not the regular tablets. States pharmacy has sent a message twice to PCP office with no response.)      Genitourinary Genitourinary Symptoms Reported: Not assessed    Integumentary Integumentary Symptoms Reported: Not assessed    Musculoskeletal Musculoskelatal Symptoms Reviewed: Weakness        Psychosocial Psychosocial Symptoms Reported: Not assessed            05/28/2023    1:32 PM  Depression screen PHQ 2/9  Decreased Interest 1  Down, Depressed, Hopeless 1  PHQ - 2 Score 2  Altered sleeping 2  Tired, decreased energy 2  Change in appetite 1  Feeling bad or failure about yourself  0  Trouble concentrating 1  Moving slowly or fidgety/restless 1  Suicidal thoughts 0  PHQ-9 Score 9  Difficult doing work/chores Somewhat difficult    There were no vitals filed for this visit.  Medications Reviewed Today     Reviewed by Lucian [name] LABOR, RN (Registered Nurse) on 08/25/23 at 1504  Med List Status: <None>   Medication Order Taking? Sig Documenting Provider Last Dose Status Informant  ACCU-CHEK  GUIDE test strip [id number]  USE ONE TEST STRIP TO CHECK BLOOD SUGARS ONCE A DAY AS NEEDED Forest Coy, MD  Active Self  Accu-Chek Softclix Lancets lancets [id number]  USE ONE LANCET TO CHECK BLOOD SUGAR ONE A DAY AS NEEDED Forest Coy, MD  Active Self  acetaminophen  (TYLENOL ) 500 MG tablet [id number]  Take 1 tablet (500 mg total) by mouth every 6 (six) hours as needed.  Patient taking differently: Take 500 mg by mouth every 6 (six) hours as needed for mild pain (pain score 1-3).   Charlyn Sora, MD  Active Self  albuterol  (PROVENTIL ) (2.5 MG/3ML) 0.083%  nebulizer solution [id number]  Take 3 mLs (2.5 mg total) by nebulization every 6 (six) hours as needed for wheezing or shortness of breath. Neda Jennet LABOR, MD  Active   albuterol  (VENTOLIN  HFA) 108 (90 Base) MCG/ACT inhaler [id number]  2 PUFFS INHALED EVERY 6 HOURS AS NEEDED FOR 30 DAYS Olalere, Adewale A, MD  Active   atorvastatin  (LIPITOR) 40 MG tablet [id number]  TAKE 1 TABLET BY MOUTH EVERY DAY Forest Coy, MD  Active Self  Azelastine  HCl 137 MCG/SPRAY SOLN [id number]  PLACE 1 SPRAY INTO BOTH NOSTRILS 2 (TWO) TIMES DAILY. USE IN EACH NOSTRIL AS DIRECTED  Patient taking differently: Place 1 spray into both nostrils daily as needed (for allergies).   Guilloud, Carolyn, MD  Active Self           Med Note (SATTERFIELD, DARIUS E   Fri May 23, 2023  8:27 PM) Still has on hand   Blood Glucose Monitoring Suppl (ACCU-CHEK GUIDE ME) w/Device KIT [id number]  Dispense one device Forest Coy, MD  Active Self  Brexpiprazole  (REXULTI ) 3 MG TABS [id number]  Take 1 tablet (3 mg total) by mouth daily. [name], Syed T, MD  Active   budesonide -glycopyrrolate-formoterol  (BREZTRI  AEROSPHERE) 160-9-4.8 MCG/ACT AERO inhaler [id number]  Inhale 2 puffs into the lungs in the morning and at bedtime. Neda Jennet LABOR, MD  Active   cetirizine  (ZYRTEC ) 10 MG tablet [id number]  Take 1 tablet (10 mg total) by mouth daily.  Patient taking differently: Take 10 mg by mouth every other day.   Guilloud, Carolyn, MD  Active Self  dicyclomine  (BENTYL ) 10 MG capsule [id number]  Take 1 capsule (10 mg total) by mouth 3 (three) times daily as needed for spasms (AB pain, diarrhea). Craig Alan SAUNDERS, PA-C  Active   diltiazem  (CARDIZEM  CD) 120 MG 24 hr capsule [id number]  Take 1 capsule (120 mg total) by mouth daily. Claudene Pacific, MD  Active   doxepin  (SINEQUAN ) 25 MG capsule [id number]  Take 1 capsule (25 mg total) by mouth at bedtime. [name], Syed T, MD  Active   ENTRESTO  24-26 MG [id number]  TAKE 1 TABLET BY MOUTH TWICE A DAY  Guilloud, Carolyn, MD  Active Self  ferrous sulfate  325 (65 FE) MG tablet [id number]  Take 1 tablet (325 mg total) by mouth daily with breakfast. Claudene Pacific, MD  Active   fluticasone  (FLONASE ) 50 MCG/ACT nasal spray [id number]  SPRAY 1 SPRAY INTO EACH NOSTRIL DAILY  Patient taking differently: Place 1 spray into both nostrils daily as needed for allergies.   Guilloud, Carolyn, MD  Active Self           Med Note (SATTERFIELD, DARIUS E   Fri May 23, 2023  8:23 PM) Still has on hand   guaiFENesin  (ROBITUSSIN) 100 MG/5ML liquid [id number]  Take 10 mLs by mouth every 6 (six) hours as needed for cough or to loosen  phlegm. Raenelle Coria, MD  Active Self           Med Note (SATTERFIELD, TEENA BRAVO   Wed Jun 25, 2023  9:13 PM) Still has on hand   hydrocortisone  (ANUSOL -HC) 2.5 % rectal cream [id number]  PLACE 1 APPLICATION RECTALLY 2 (TWO) TIMES DAILY. Collier, Amanda R, PA-C  Active   ipratropium (ATROVENT ) 0.06 % nasal spray [id number]  Place 2 sprays into both nostrils daily as needed for rhinitis. [provider]  Active Self           Med Note (SATTERFIELD, DARIUS E   Fri May 23, 2023  8:25 PM) Still has on hand   ipratropium-albuterol  (DUONEB) 0.5-2.5 (3) MG/3ML SOLN [id number]  Take 3 mLs by nebulization 2 (two) times daily. Neda Jennet LABOR, MD  Active   lamoTRIgine  (LAMICTAL ) 100 MG tablet [id number]  Take 0.5 tablets (50 mg total) by mouth 2 (two) times daily. Curry Leni DASEN, MD  Active   LINZESS  290 MCG CAPS capsule [id number]  TAKE 1 CAPSULE BY MOUTH DAILY BEFORE BREAKFAST. Craig Alan SAUNDERS, PA-C  Active   meclizine  (ANTIVERT ) 25 MG tablet [id number]  Take 25 mg by mouth 3 (three) times daily. [provider]  Active Self  megestrol  (MEGACE ) 40 MG tablet [id number]  Take 2 tablets (80 mg total) by mouth daily as needed. Fredirick Glenys RAMAN, MD  Active   metoprolol  succinate (TOPROL -XL) 100 MG 24 hr tablet [id number]  Take 1 tablet (100 mg total) by mouth at bedtime. Claudene Pacific, MD   Active   omeprazole  (PRILOSEC) 40 MG capsule [id number]  TAKE 1 CAPSULE (40 MG TOTAL) BY MOUTH DAILY. Craig Alan SAUNDERS, PA-C  Active Self  ondansetron  (ZOFRAN -ODT) 8 MG disintegrating tablet [id number] Yes Take 1 tablet (8 mg total) by mouth every 8 (eight) hours as needed. Lovie Clarity, MD  Active Self  oxyCODONE  (OXY IR/ROXICODONE ) 5 MG immediate release tablet [id number]  Take 5 mg by mouth 2 (two) times daily. [provider]  Active Self           Med Note (SATTERFIELD, TEENA BRAVO   Wed Jun 25, 2023  9:16 PM) Patient verified she is taking every day   predniSONE  (DELTASONE ) 20 MG tablet [id number]  TAKE 1 TABLET BY MOUTH DAILY WITH BREAKFAST Olalere, Adewale A, MD  Active   promethazine  (PHENERGAN ) 25 MG tablet [id number]  Take 1 tablet (25 mg total) by mouth every 6 (six) hours as needed for nausea or vomiting. Craig Alan SAUNDERS DEVONNA  Active   Safety Seal Miscellaneous MISC [id number]  Apply 1 Application topically in the morning. Medication Name; AA Gel Alm Delon SAILOR, DO  Active Self  Semaglutide , 2 MG/DOSE, (OZEMPIC , 2 MG/DOSE,) 8 MG/3ML SOPN [id number]  INJECT 2 MG INTO THE SKIN ONCE A WEEK. Guilloud, Carolyn, MD  Active Self           Med Note (SATTERFIELD, TEENA BRAVO   Fri May 23, 2023  8:02 PM) Take on Fridays  SUMAtriptan  (IMITREX ) 25 MG tablet [id number]  Take 1 tablet (25 mg total) by mouth once for 1 dose. May repeat in 2 hours if headache persists or recurs. Claudene Pacific, MD  Expired 06/27/23 2359   SYMBICORT  160-4.5 MCG/ACT inhaler [id number]  Inhale 2 puffs into the lungs 2 (two) times daily.  Patient not taking: Reported on 07/21/2023   [provider]  Active Self  tiZANidine  (ZANAFLEX ) 4 MG tablet [id number]  Take 4 mg by mouth every 8 (eight) hours  as needed for muscle spasms. [provider]  Active Self  torsemide  (DEMADEX ) 20 MG tablet [id number]  TAKE 1 TABLET BY MOUTH TWICE A DAY Guilloud, Carolyn, MD  Active Self            Recommendation:    Specialty provider follow-up :Oncology appt 09/01/23, Pulmonary 09/16/23 DME requests:  other CPAP - Called [facility], left message with staff that patient doesn't have her CPAP at all, staff will send message to MD.  Staff states [employer] MAY loan a CPAP and to call for information.   -This RNCM left message with PCP office regarding refill for Ondansetron  8mg  disintegrating tablets, instead of the IR tabs.   Follow Up Plan:   Telephone follow-up in 1 day  [name] Stamp BSN, CCM [place]  [facility] Population Health RN Care Manager Direct Dial: [phone]  Fax: [phone]

## 2023-08-26 ENCOUNTER — Telehealth: Payer: Self-pay

## 2023-08-26 ENCOUNTER — Other Ambulatory Visit: Payer: Self-pay | Admitting: Pulmonary Disease

## 2023-08-26 NOTE — Telephone Encounter (Signed)
 Copied from CRM [id number]. Topic: Clinical - Order For Equipment >> Aug 25, 2023  4:40 PM Rilla B wrote: Reason for CRM: Patient lost her CPAP machine. [place] Case Manager calling to say insurance will not pay for another machine until 04/2024. Are there any options available for patient? Lucyann said renting may be an option and suggested calling [employer] or [employer] loss adjuster (local). Want to make sure Dr Neda is aware that patient does not have CPAP.   Spoke with patient regarding prior message.Patient stated she could not find her CPAP machine in storage and patient's insurance will not cover a new machine until 04/2024. Patient also asked if there was patient assistance program for CPAP machine ? Patient stated she is unable to lay down with out  having SOB and trouble breathing .  Dr.Olalere can you please advise  Thank you

## 2023-08-26 NOTE — Telephone Encounter (Signed)
 Not much I can do to help with the situation if insurance will not pay for a new machine as of now  She should contact medical supply company and see about rental if this is an option

## 2023-08-27 NOTE — Telephone Encounter (Signed)
 I called and spoke with the pt and notified of response from Dr. Neda. Pt verbalized understanding. Nothing further needed.

## 2023-08-28 ENCOUNTER — Inpatient Hospital Stay (HOSPITAL_COMMUNITY)
Admission: EM | Admit: 2023-08-28 | Discharge: 2023-08-28 | DRG: 158 | Disposition: A | Discharge status: Home / Self Care | Attending: Internal Medicine | Admitting: Internal Medicine

## 2023-08-28 ENCOUNTER — Emergency Department (HOSPITAL_COMMUNITY)

## 2023-08-28 ENCOUNTER — Other Ambulatory Visit: Payer: Self-pay

## 2023-08-28 ENCOUNTER — Telehealth: Payer: Self-pay | Admitting: Pulmonary Disease

## 2023-08-28 DIAGNOSIS — Z79899 Other long term (current) drug therapy: Secondary | ICD-10-CM

## 2023-08-28 DIAGNOSIS — I130 Hypertensive heart and chronic kidney disease with heart failure and stage 1 through stage 4 chronic kidney disease, or unspecified chronic kidney disease: Secondary | ICD-10-CM | POA: Diagnosis present

## 2023-08-28 DIAGNOSIS — Z7952 Long term (current) use of systemic steroids: Secondary | ICD-10-CM | POA: Diagnosis not present

## 2023-08-28 DIAGNOSIS — R229 Localized swelling, mass and lump, unspecified: Secondary | ICD-10-CM | POA: Diagnosis present

## 2023-08-28 DIAGNOSIS — K029 Dental caries, unspecified: Secondary | ICD-10-CM | POA: Diagnosis present

## 2023-08-28 DIAGNOSIS — I509 Heart failure, unspecified: Secondary | ICD-10-CM | POA: Diagnosis present

## 2023-08-28 DIAGNOSIS — K589 Irritable bowel syndrome without diarrhea: Secondary | ICD-10-CM | POA: Diagnosis present

## 2023-08-28 DIAGNOSIS — Z888 Allergy status to other drugs, medicaments and biological substances status: Secondary | ICD-10-CM | POA: Diagnosis not present

## 2023-08-28 DIAGNOSIS — K047 Periapical abscess without sinus: Principal | ICD-10-CM | POA: Diagnosis present

## 2023-08-28 DIAGNOSIS — M79605 Pain in left leg: Secondary | ICD-10-CM | POA: Diagnosis present

## 2023-08-28 DIAGNOSIS — Z7985 Long-term (current) use of injectable non-insulin antidiabetic drugs: Secondary | ICD-10-CM | POA: Diagnosis not present

## 2023-08-28 DIAGNOSIS — K922 Gastrointestinal hemorrhage, unspecified: Secondary | ICD-10-CM | POA: Diagnosis present

## 2023-08-28 DIAGNOSIS — Z7951 Long term (current) use of inhaled steroids: Secondary | ICD-10-CM

## 2023-08-28 DIAGNOSIS — N189 Chronic kidney disease, unspecified: Secondary | ICD-10-CM | POA: Diagnosis present

## 2023-08-28 DIAGNOSIS — Z79891 Long term (current) use of opiate analgesic: Secondary | ICD-10-CM

## 2023-08-28 DIAGNOSIS — J4521 Mild intermittent asthma with (acute) exacerbation: Secondary | ICD-10-CM | POA: Diagnosis present

## 2023-08-28 DIAGNOSIS — G4733 Obstructive sleep apnea (adult) (pediatric): Secondary | ICD-10-CM

## 2023-08-28 LAB — CBC WITH DIFFERENTIAL/PLATELET
Abs Immature Granulocytes: 0.15 K/uL — ABNORMAL HIGH (ref 0.00–0.07)
Basophils Absolute: 0.1 K/uL (ref 0.0–0.1)
Basophils Relative: 0 %
Eosinophils Absolute: 0.1 K/uL (ref 0.0–0.5)
Eosinophils Relative: 1 %
HCT: 36.2 % (ref 36.0–46.0)
Hemoglobin: 11.3 g/dL — ABNORMAL LOW (ref 12.0–15.0)
Immature Granulocytes: 1 %
Lymphocytes Relative: 20 %
Lymphs Abs: 2.6 K/uL (ref 0.7–4.0)
MCH: 26.1 pg (ref 26.0–34.0)
MCHC: 31.2 g/dL (ref 30.0–36.0)
MCV: 83.6 fL (ref 80.0–100.0)
Monocytes Absolute: 1.1 K/uL — ABNORMAL HIGH (ref 0.1–1.0)
Monocytes Relative: 9 %
Neutro Abs: 8.7 K/uL — ABNORMAL HIGH (ref 1.7–7.7)
Neutrophils Relative %: 69 %
Platelets: 438 K/uL — ABNORMAL HIGH (ref 150–400)
RBC: 4.33 MIL/uL (ref 3.87–5.11)
RDW: 14.9 % (ref 11.5–15.5)
WBC: 12.7 K/uL — ABNORMAL HIGH (ref 4.0–10.5)
nRBC: 0 % (ref 0.0–0.2)

## 2023-08-28 LAB — TROPONIN I (HIGH SENSITIVITY)
Troponin I (High Sensitivity): 5 ng/L (ref <18)
Troponin I (High Sensitivity): 3 ng/L (ref <18)

## 2023-08-28 LAB — COMPREHENSIVE METABOLIC PANEL WITH GFR
ALT: 11 U/L (ref 0–44)
AST: 12 U/L — ABNORMAL LOW (ref 15–41)
Albumin: 3.3 g/dL — ABNORMAL LOW (ref 3.5–5.0)
Alkaline Phosphatase: 63 U/L (ref 38–126)
Anion gap: 7 (ref 5–15)
BUN: 8 mg/dL (ref 6–20)
CO2: 23 mmol/L (ref 22–32)
Calcium: 8.7 mg/dL — ABNORMAL LOW (ref 8.9–10.3)
Chloride: 107 mmol/L (ref 98–111)
Creatinine, Ser: 1.07 mg/dL — ABNORMAL HIGH (ref 0.44–1.00)
GFR, Estimated: >60 mL/min (ref >60)
Glucose, Bld: 113 mg/dL — ABNORMAL HIGH (ref 70–99)
Potassium: 4.1 mmol/L (ref 3.5–5.1)
Sodium: 137 mmol/L (ref 135–145)
Total Bilirubin: 0.2 mg/dL (ref 0.0–1.2)
Total Protein: 6.6 g/dL (ref 6.5–8.1)

## 2023-08-28 LAB — BRAIN NATRIURETIC PEPTIDE: B Natriuretic Peptide: 12.9 pg/mL (ref 0.0–100.0)

## 2023-08-28 LAB — D-DIMER, QUANTITATIVE: D-Dimer, Quant: <0.27 ug{FEU}/mL (ref 0.00–0.50)

## 2023-08-28 MED ORDER — OXYCODONE-ACETAMINOPHEN 5-325 MG PO TABS
1.0000 | ORAL_TABLET | Freq: Once | ORAL | Status: AC
  Administered 2023-08-28: 1 via ORAL
  Filled 2023-08-28: qty 1

## 2023-08-28 MED ORDER — FENTANYL CITRATE PF 50 MCG/ML IJ SOSY: 12.5000 ug | PREFILLED_SYRINGE | INTRAMUSCULAR | Status: DC | PRN

## 2023-08-28 MED ORDER — ALBUTEROL SULFATE (2.5 MG/3ML) 0.083% IN NEBU
5.0000 mg | INHALATION_SOLUTION | Freq: Once | RESPIRATORY_TRACT | Status: AC
  Administered 2023-08-28: 5 mg via RESPIRATORY_TRACT
  Filled 2023-08-28: qty 6

## 2023-08-28 MED ORDER — ALBUTEROL SULFATE (2.5 MG/3ML) 0.083% IN NEBU: 2.5000 mg | INHALATION_SOLUTION | RESPIRATORY_TRACT | Status: DC | PRN

## 2023-08-28 MED ORDER — ONDANSETRON HCL 4 MG/2ML IJ SOLN: 4.0000 mg | Freq: Four times a day (QID) | INTRAMUSCULAR | Status: DC | PRN

## 2023-08-28 MED ORDER — PANTOPRAZOLE SODIUM 40 MG IV SOLR: 40.0000 mg | INTRAVENOUS | Status: DC

## 2023-08-28 MED ORDER — CLINDAMYCIN HCL 300 MG PO CAPS: 300.0000 mg | ORAL_CAPSULE | Freq: Four times a day (QID) | ORAL | 0 refills | Status: DC

## 2023-08-28 MED ORDER — CLINDAMYCIN HCL 150 MG PO CAPS
300.0000 mg | ORAL_CAPSULE | Freq: Once | ORAL | Status: AC
  Administered 2023-08-28: 300 mg via ORAL
  Filled 2023-08-28: qty 2

## 2023-08-28 MED ORDER — ONDANSETRON HCL 4 MG PO TABS: 4.0000 mg | ORAL_TABLET | Freq: Four times a day (QID) | ORAL | Status: DC | PRN

## 2023-08-28 MED ORDER — HYDROCODONE-ACETAMINOPHEN 5-325 MG PO TABS: 2.0000 | ORAL_TABLET | ORAL | 0 refills | Status: DC | PRN

## 2023-08-28 NOTE — ED Notes (Signed)
 This RN responded to patient call light. Patient upset and states she would just like to be discharged. Pt states she would like to speak EDP. EDP and primary RN notified.

## 2023-08-28 NOTE — Telephone Encounter (Signed)
 Copied from CRM [id number]. Topic: Clinical - Prescription Issue >> Aug 27, 2023  5:25 PM Rilla B wrote: Reason for CRM: [facility] calling.  Patient lost her CPAP and [employer] is out of network. [employer] is in network and would be covered for patient's CPAP and all supplies. Please include with signed or sleep study, chart notes, and demographics.    Please send order to: FAX: [phone]

## 2023-08-28 NOTE — Discharge Instructions (Signed)
 Take the antibiotics as discussed.  Follow-up with your oral surgeon as discussed.  Return to the emergency room if you have any worsening symptoms.

## 2023-08-28 NOTE — ED Provider Notes (Signed)
 [place] [facility] AT [facility] Provider Note   CSN: [id number] Arrival date & time: 08/28/23  9065     Patient presents with: Dental Problem, Facial Swelling, and Shortness of Breath   Lindsay [name] is a 53 y.o. female.   Patient is a 53 year old who presents with 2 complaints.  She has pain in her right upper tooth.  It has been going on for about a week.  She has had some swelling over the last few days.  She saw her dentist 2 days ago and was started on amoxicillin .  She has been on amoxicillin  for about 24 hours.  She says the swelling has gotten a bit worse.  She denies any trouble swallowing.  She did have a fever she said about 101 yesterday evening.  She also has increased shortness of breath.  She has a history of asthma, hypertension, IBS, chronic kidney disease, CHF.  She says she has had some increased shortness of breath for the last couple months but seems to be a little worse over the last 2 to 3 days.  She has a nonproductive cough.  She is on baseline oxygen  at 3 L/min but today has had to turn it up to 5 L/min.  She has had some intermittent tightness across her chest, most recently yesterday.  She denies any increased leg swelling although has had some calf pain in her left leg but says that that happens from time to time and is not very unusual.  She has been referred to an oral surgeon regarding her tooth but has not been able to follow-up yet.       Prior to Admission medications   Medication Sig Start Date End Date Taking? Authorizing Provider  amoxicillin  (AMOXIL ) 500 MG capsule Take 500 mg by mouth 3 (three) times daily. 08/26/23  Yes [provider]  clindamycin  (CLEOCIN ) 300 MG capsule Take 1 capsule (300 mg total) by mouth 4 (four) times daily. X 7 days 08/28/23  Yes Lenor Hollering, MD  fluconazole  (DIFLUCAN ) 150 MG tablet Take 150 mg by mouth 2 (two) times daily. 08/13/23  Yes [provider]  HYDROcodone -acetaminophen   (NORCO/VICODIN) 5-325 MG tablet Take 2 tablets by mouth every 4 (four) hours as needed. 08/28/23  Yes Lenor Hollering, MD  ACCU-CHEK GUIDE test strip USE ONE TEST STRIP TO CHECK BLOOD SUGARS ONCE A DAY AS NEEDED 10/16/22   Forest Coy, MD  Accu-Chek Softclix Lancets lancets USE ONE LANCET TO CHECK BLOOD SUGAR ONE A DAY AS NEEDED 07/03/22   Forest Coy, MD  acetaminophen  (TYLENOL ) 500 MG tablet Take 1 tablet (500 mg total) by mouth every 6 (six) hours as needed. Patient taking differently: Take 500 mg by mouth every 6 (six) hours as needed for mild pain (pain score 1-3). 09/17/22   Charlyn Sora, MD  albuterol  (PROVENTIL ) (2.5 MG/3ML) 0.083% nebulizer solution Take 3 mLs (2.5 mg total) by nebulization every 6 (six) hours as needed for wheezing or shortness of breath. 08/12/23   Olalere, Jennet LABOR, MD  albuterol  (VENTOLIN  HFA) 108 (90 Base) MCG/ACT inhaler 2 PUFFS INHALED EVERY 6 HOURS AS NEEDED FOR 30 DAYS 07/18/23   Olalere, Adewale A, MD  atorvastatin  (LIPITOR) 40 MG tablet TAKE 1 TABLET BY MOUTH EVERY DAY 04/14/23   Forest Coy, MD  Azelastine  HCl 137 MCG/SPRAY SOLN PLACE 1 SPRAY INTO BOTH NOSTRILS 2 (TWO) TIMES DAILY. USE IN EACH NOSTRIL AS DIRECTED Patient taking differently: Place 1 spray into both nostrils daily as needed (  for allergies). 01/17/23   Forest Coy, MD  Brexpiprazole  (REXULTI ) 3 MG TABS Take 1 tablet (3 mg total) by mouth daily. 08/05/23   [name], Leni DASEN, MD  budesonide -glycopyrrolate-formoterol  (BREZTRI  AEROSPHERE) 160-9-4.8 MCG/ACT AERO inhaler Inhale 2 puffs into the lungs in the morning and at bedtime. 07/09/23   Olalere, Jennet LABOR, MD  cetirizine  (ZYRTEC ) 10 MG tablet Take 1 tablet (10 mg total) by mouth daily. Patient taking differently: Take 10 mg by mouth every other day. 08/05/22   Guilloud, Carolyn, MD  dicyclomine  (BENTYL ) 10 MG capsule Take 1 capsule (10 mg total) by mouth 3 (three) times daily as needed for spasms (AB pain, diarrhea). 07/15/23   Craig Alan SAUNDERS, PA-C  diltiazem  (CARDIZEM  CD) 120 MG 24 hr capsule Take 1 capsule (120 mg total) by mouth daily. 06/27/23   Claudene Pacific, MD  doxepin  (SINEQUAN ) 25 MG capsule Take 1 capsule (25 mg total) by mouth at bedtime. 08/05/23   [name], Leni DASEN, MD  ENTRESTO  24-26 MG TAKE 1 TABLET BY MOUTH TWICE A DAY 03/12/23   Guilloud, Carolyn, MD  ferrous sulfate  325 (65 FE) MG tablet Take 1 tablet (325 mg total) by mouth daily with breakfast. 06/28/23   Claudene Pacific, MD  fluticasone  (FLONASE ) 50 MCG/ACT nasal spray SPRAY 1 SPRAY INTO EACH NOSTRIL DAILY Patient taking differently: Place 1 spray into both nostrils daily as needed for allergies. 09/24/22   Guilloud, Carolyn, MD  guaiFENesin  (ROBITUSSIN) 100 MG/5ML liquid Take 10 mLs by mouth every 6 (six) hours as needed for cough or to loosen phlegm. 05/25/23   Raenelle Coria, MD  hydrocortisone  (ANUSOL -HC) 2.5 % rectal cream PLACE 1 APPLICATION RECTALLY 2 (TWO) TIMES DAILY. 08/12/23   Craig Alan SAUNDERS, PA-C  ipratropium (ATROVENT ) 0.06 % nasal spray Place 2 sprays into both nostrils daily as needed for rhinitis. 04/14/20   [provider]  ipratropium-albuterol  (DUONEB) 0.5-2.5 (3) MG/3ML SOLN Take 3 mLs by nebulization 2 (two) times daily. 08/12/23 09/11/23  Neda Jennet LABOR, MD  lamoTRIgine  (LAMICTAL ) 100 MG tablet Take 0.5 tablets (50 mg total) by mouth 2 (two) times daily. 08/05/23   [name], Leni DASEN, MD  LINZESS  290 MCG CAPS capsule TAKE 1 CAPSULE BY MOUTH DAILY BEFORE BREAKFAST. 08/12/23   Craig Alan SAUNDERS, PA-C  meclizine  (ANTIVERT ) 25 MG tablet Take 25 mg by mouth 3 (three) times daily. 06/01/23   [provider]  megestrol  (MEGACE ) 40 MG tablet Take 2 tablets (80 mg total) by mouth daily as needed. 07/18/23   Fredirick Glenys RAMAN, MD  metoprolol  succinate (TOPROL -XL) 100 MG 24 hr tablet Take 1 tablet (100 mg total) by mouth at bedtime. 06/27/23   Claudene Pacific, MD  omeprazole  (PRILOSEC) 40 MG capsule TAKE 1 CAPSULE (40 MG TOTAL) BY MOUTH DAILY. 01/17/23    Craig Alan SAUNDERS, PA-C  ondansetron  (ZOFRAN -ODT) 8 MG disintegrating tablet Take 1 tablet (8 mg total) by mouth every 8 (eight) hours as needed. 05/01/23   Lovie Clarity, MD  oxyCODONE  (OXY IR/ROXICODONE ) 5 MG immediate release tablet Take 5 mg by mouth 2 (two) times daily. 03/10/23   [provider]  oxyCODONE -acetaminophen  (PERCOCET/ROXICET) 5-325 MG tablet Take by mouth.    [provider]  predniSONE  (DELTASONE ) 20 MG tablet TAKE 1 TABLET BY MOUTH DAILY WITH BREAKFAST 08/12/23   Olalere, Adewale A, MD  promethazine  (PHENERGAN ) 25 MG tablet Take 1 tablet (25 mg total) by mouth every 6 (six) hours as needed for nausea or vomiting. 07/15/23   Craig Alan SAUNDERS,  PA-C  Safety Seal Miscellaneous MISC Apply 1 Application topically in the morning. Medication Name; AA Gel 04/16/23   Alm Delon SAILOR, DO  Semaglutide , 2 MG/DOSE, (OZEMPIC , 2 MG/DOSE,) 8 MG/3ML SOPN INJECT 2 MG INTO THE SKIN ONCE A WEEK. 04/14/23   Forest Coy, MD  SUMAtriptan  (IMITREX ) 25 MG tablet Take 1 tablet (25 mg total) by mouth once for 1 dose. May repeat in 2 hours if headache persists or recurs. 06/27/23 06/27/23  Claudene Pacific, MD  SYMBICORT  160-4.5 MCG/ACT inhaler Inhale 2 puffs into the lungs 2 (two) times daily. Patient not taking: Reported on 07/21/2023 05/20/23   [provider]  tiZANidine  (ZANAFLEX ) 4 MG tablet Take 4 mg by mouth every 8 (eight) hours as needed for muscle spasms. 06/04/23   [provider]  torsemide  (DEMADEX ) 20 MG tablet TAKE 1 TABLET BY MOUTH TWICE A DAY 01/27/23   Forest Coy, MD    Allergies: Lisinopril     Review of Systems  Constitutional:  Positive for fever. Negative for chills, diaphoresis and fatigue.  HENT:  Positive for dental problem and facial swelling. Negative for congestion, rhinorrhea and sneezing.   Eyes: Negative.   Respiratory:  Positive for cough, chest tightness and shortness of breath.   Cardiovascular:  Negative for leg swelling.   Gastrointestinal:  Negative for abdominal pain, blood in stool, diarrhea, nausea and vomiting.  Genitourinary:  Negative for difficulty urinating, flank pain and frequency.  Musculoskeletal:  Negative for arthralgias and back pain.  Skin:  Negative for rash.  Neurological:  Negative for dizziness, speech difficulty, weakness, numbness and headaches.    Updated Vital Signs BP 100/85   Pulse (!) 105   Temp 98.6 F (37 C)   Resp (!) 24   Ht 5' 11 (1.803 m)   Wt 106.6 kg   LMP 08/20/2023   SpO2 100%   BMI 32.78 kg/m   Physical Exam Constitutional:      Appearance: She is well-developed.  HENT:     Head: Normocephalic and atraumatic.     Mouth/Throat:     Comments: Positive decayed teeth, there is broken, decayed teeth involving the left upper back molars.  There is some swelling around the back molar.  No trismus, uvula is midline, no elevation of the tongue Eyes:     Pupils: Pupils are equal, round, and reactive to light.  Cardiovascular:     Rate and Rhythm: Normal rate and regular rhythm.     Heart sounds: Normal heart sounds.  Pulmonary:     Effort: Pulmonary effort is normal. No respiratory distress.     Breath sounds: No wheezing or rales.     Comments: Decreased breath sounds bilaterally, mild tachypnea Chest:     Chest wall: No tenderness.  Abdominal:     General: Bowel sounds are normal.     Palpations: Abdomen is soft.     Tenderness: There is no abdominal tenderness. There is no guarding or rebound.  Musculoskeletal:        General: Normal range of motion.     Cervical back: Normal range of motion and neck supple.     Comments: Trace edema to lower extremities bilaterally  Lymphadenopathy:     Cervical: No cervical adenopathy.  Skin:    General: Skin is warm and dry.     Findings: No rash.  Neurological:     Mental Status: She is alert and oriented to person, place, and time.     (all labs ordered are listed,  but only abnormal results are  displayed) Labs Reviewed  COMPREHENSIVE METABOLIC PANEL WITH GFR - Abnormal; Notable for the following components:      Result Value   Glucose, Bld 113 (*)    Creatinine, Ser 1.07 (*)    Calcium  8.7 (*)    Albumin  3.3 (*)    AST 12 (*)    All other components within normal limits  CBC WITH DIFFERENTIAL/PLATELET - Abnormal; Notable for the following components:   WBC 12.7 (*)    Hemoglobin 11.3 (*)    Platelets 438 (*)    Neutro Abs 8.7 (*)    Monocytes Absolute 1.1 (*)    Abs Immature Granulocytes 0.15 (*)    All other components within normal limits  D-DIMER, QUANTITATIVE  BRAIN NATRIURETIC PEPTIDE  TROPONIN I (HIGH SENSITIVITY)  TROPONIN I (HIGH SENSITIVITY)    EKG: EKG Interpretation Date/Time:  Thursday August 28 2023 10:30:07 EDT Ventricular Rate:  98 PR Interval:  148 QRS Duration:  86 QT Interval:  368 QTC Calculation: 470 R Axis:   44  Text Interpretation: Sinus rhythm since last tracing no significant change Confirmed by Lenor Hollering [phone]) on 08/28/2023 11:04:35 AM  Radiology: [facility] Chest Port 1 View Result Date: 08/28/2023 CLINICAL DATA:  SOB EXAM: PORTABLE CHEST - 1 VIEW COMPARISON:  May 23, 2023 FINDINGS: Low lung volumes with bronchovascular crowding. No focal airspace consolidation, pleural effusion, or pneumothorax. No cardiomegaly. No acute fracture or destructive lesion. IMPRESSION: Low lung volumes.  Otherwise, no acute cardiopulmonary abnormality. Electronically Signed   By: Rogelia Myers M.D.   On: 08/28/2023 10:58     Procedures   Medications Ordered in the ED  albuterol  (PROVENTIL ) (2.5 MG/3ML) 0.083% nebulizer solution 5 mg (5 mg Nebulization Given 08/28/23 1346)  oxyCODONE -acetaminophen  (PERCOCET/ROXICET) 5-325 MG per tablet 1 tablet (1 tablet Oral Given 08/28/23 1330)  clindamycin  (CLEOCIN ) capsule 300 mg (300 mg Oral Given 08/28/23 1330)                                    Medical Decision Making Amount and/or Complexity of Data  Reviewed Labs: ordered. Radiology: ordered.  Risk Prescription drug management.   This patient presents to the ED for concern of tooth pain, shortness of breath, this involves an extensive number of treatment options, and is a complaint that carries with it a high risk of complications and morbidity.  I considered the following differential and admission for this acute, potentially life threatening condition.  The differential diagnosis includes dental abscess, dental decay, Ludwig angina, retropharyngeal abscess, pneumonia, asthma, PE, pulmonary edema, pneumothorax, ACS  MDM:    Patient is a 53 year old who has 2 complaints.  She has some pain and swelling around her right upper back molar.  She has decayed teeth and what appears to be a small periapical abscess.  She has been on amoxicillin  which has not been improving it.  Will switch her to clindamycin .  I do not see any other deep tissue infections.  She is not febrile.  No concerns for sepsis.  She also has some shortness of breath.  She does have a history of asthma.  Chest x-ray does not reveal any evidence of pneumonia.  She does not have other symptoms that would be more concerning for PE.  No suggestions of fluid overload.  Her D-dimer is normal.  Her troponins are normal.  She was given nebulizer treatment and her  symptoms resolved.  She is currently on her baseline oxygen  of 3 L/min without any hypoxia.  No increased work of breathing.  She was discharged home in good condition.  Was encouraged to follow-up with her PCP.  Will also follow-up with her oral surgeon regarding her tooth.  Return precautions were given.  (Labs, imaging, consults)  Labs: I Ordered, and personally interpreted labs.  The pertinent results include: Normal D-dimer, normal troponins, normal BNP, WBC count mildly elevated  Imaging Studies ordered: I ordered imaging studies including chest x-ray I independently visualized and interpreted imaging. I agree with  the radiologist interpretation  Additional history obtained from chart.  External records from outside source obtained and reviewed including prior notes  Cardiac Monitoring: The patient was maintained on a cardiac monitor.  If on the cardiac monitor, I personally viewed and interpreted the cardiac monitored which showed an underlying rhythm of: Sinus rhythm  Reevaluation: After the interventions noted above, I reevaluated the patient and found that they have :improved  Social Determinants of Health:    Disposition: Discharged to home  Co morbidities that complicate the patient evaluation  Past Medical History:  Diagnosis Date   Acid reflux    Anemia    Iron  Def   Anorexia    CHF (congestive heart failure) (HCC)    Chronic kidney disease    Nephrotic syndrome   Colon polyp 2009   Depression with anxiety    Edema leg    Fibroid tumor 04/2022   Fibromyalgia    Hemorrhoids    Hidradenitis suppurativa    Hypertension    IBS (irritable bowel syndrome)    Low back pain    Migraines    Morbidly obese (HCC)    Neuromuscular disorder (HCC)    fibromyalgia   Neuropathy    Panic attacks    Polyp of vocal cord or larynx    Stroke (HCC)    Tonsil pain      Medicines Meds ordered this encounter  Medications   DISCONTD: fentaNYL  (SUBLIMAZE ) injection 12.5-50 mcg   DISCONTD: ondansetron  (ZOFRAN ) tablet 4 mg   DISCONTD: ondansetron  (ZOFRAN ) injection 4 mg   DISCONTD: albuterol  (PROVENTIL ) (2.5 MG/3ML) 0.083% nebulizer solution 2.5 mg   DISCONTD: pantoprazole  (PROTONIX ) injection 40 mg   albuterol  (PROVENTIL ) (2.5 MG/3ML) 0.083% nebulizer solution 5 mg   oxyCODONE -acetaminophen  (PERCOCET/ROXICET) 5-325 MG per tablet 1 tablet    Refill:  0   clindamycin  (CLEOCIN ) capsule 300 mg   clindamycin  (CLEOCIN ) 300 MG capsule    Sig: Take 1 capsule (300 mg total) by mouth 4 (four) times daily. X 7 days    Dispense:  28 capsule    Refill:  0   HYDROcodone -acetaminophen   (NORCO/VICODIN) 5-325 MG tablet    Sig: Take 2 tablets by mouth every 4 (four) hours as needed.    Dispense:  10 tablet    Refill:  0    I have reviewed the patients home medicines and have made adjustments as needed  Problem List / ED Course: Problem List Items Addressed This Visit   None Visit Diagnoses       Dental abscess    -  Primary     Mild intermittent asthma with exacerbation       Relevant Medications   albuterol  (PROVENTIL ) (2.5 MG/3ML) 0.083% nebulizer solution 5 mg (Completed)                Final diagnoses:  Dental abscess  Mild intermittent asthma with exacerbation  ED Discharge Orders          Ordered    clindamycin  (CLEOCIN ) 300 MG capsule  4 times daily        08/28/23 1512    HYDROcodone -acetaminophen  (NORCO/VICODIN) 5-325 MG tablet  Every 4 hours PRN        08/28/23 1512               Lenor Hollering, MD 08/28/23 1524

## 2023-08-28 NOTE — ED Triage Notes (Signed)
 Pt. Stated, Lindsay [name] had a tooth that broke off and My dentist told me to come here. When I did a virtual visit they sent me an antibiotic of Amoxicillin  on Tuesday  evening. I probably need a stronger antibiotic. Also my pulmonary Dr, said to come here cause Im having some lung excerebration. I normally are on 3 and have to move up to 5L

## 2023-08-29 ENCOUNTER — Encounter (HOSPITAL_COMMUNITY): Payer: Self-pay | Admitting: Psychiatry

## 2023-08-29 ENCOUNTER — Telehealth (HOSPITAL_COMMUNITY): Admitting: Psychiatry

## 2023-08-29 ENCOUNTER — Ambulatory Visit (HOSPITAL_COMMUNITY): Admitting: Licensed Clinical Social Worker

## 2023-08-29 VITALS — Wt 235.0 lb

## 2023-08-29 DIAGNOSIS — F331 Major depressive disorder, recurrent, moderate: Secondary | ICD-10-CM | POA: Diagnosis not present

## 2023-08-29 DIAGNOSIS — F4312 Post-traumatic stress disorder, chronic: Secondary | ICD-10-CM

## 2023-08-29 DIAGNOSIS — F411 Generalized anxiety disorder: Secondary | ICD-10-CM

## 2023-08-29 MED ORDER — LAMOTRIGINE 150 MG PO TABS: ORAL_TABLET | ORAL | 2 refills | Status: DC

## 2023-08-29 MED ORDER — REXULTI 3 MG PO TABS: 3.0000 mg | ORAL_TABLET | Freq: Every day | ORAL | 2 refills | Status: DC

## 2023-08-29 MED ORDER — DOXEPIN HCL 25 MG PO CAPS
25.0000 mg | ORAL_CAPSULE | Freq: Every day | ORAL | 2 refills | Status: DC
Start: 2023-08-29 — End: 2023-12-08

## 2023-08-29 NOTE — Transitions of Care (Post Inpatient/ED Visit) (Signed)
 08/29/2023  Patient ID: Lindsay [name], female   DOB: 04-28-1970, 53 y.o.   MRN: [id number]  Chart Review for transitions of care. Patient was not admitted to hospital.   Jamariyah Johannsen J. Sylar Voong RN, MSN [facility], [facility] RN Care Manager Direct Dial: [phone]  Fax: [phone] Website: [email or web address]

## 2023-08-29 NOTE — Progress Notes (Signed)
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  Homicidal Thoughts:  No  Memory:  Immediate;   Good Recent;   Good Remote;   Fair  Judgement:  Intact  Insight:  Shallow  Psychomotor Activity:  Decreased  Concentration:  Concentration: Fair and Attention Span: Fair  Recall:  [employer] of Knowledge:  Fair  Language:  Good  Akathisia:  No  Handed:  Right  AIMS (if indicated):     Assets:  Communication Skills Desire for Improvement Social Support  ADL's:  Intact  Cognition:  WNL  Sleep:  fair       05/28/2023    1:32 PM 10/02/2022    8:30 AM 09/25/2022   10:28 AM 08/07/2022   10:11 AM 08/07/2022    9:44 AM  Depression screen PHQ 2/9  Decreased Interest 1 1 1 1 1   Down,  Depressed, Hopeless 1 1 1 1 1   PHQ - 2 Score 2 2 2 2 2   Altered sleeping 2 0 2 2   Tired, decreased energy 2 1 2 2    Change in appetite 1 0 3 3   Feeling bad or failure about yourself  0 0 0 0   Trouble concentrating 1 0 2 2   Moving slowly or fidgety/restless 1 0 1 1   Suicidal thoughts 0 0 0 0  PHQ-9 Score 9 3 12 12    Difficult doing work/chores Somewhat difficult Somewhat difficult Very difficult Very difficult     Assessment/Plan: Major depressive disorder, recurrent episode, moderate (HCC) - Plan: doxepin  (SINEQUAN ) 25 MG capsule, lamoTRIgine  (LAMICTAL ) 150 MG tablet, Brexpiprazole  (REXULTI ) 3 MG TABS  Chronic post-traumatic stress disorder (PTSD) - Plan: doxepin  (SINEQUAN ) 25 MG capsule, Brexpiprazole  (REXULTI ) 3 MG TABS  Generalized anxiety disorder - Plan: doxepin  (SINEQUAN ) 25 MG capsule  Patient is 53 year old African-American female with multiple chronic health issues including sleep apnea, COPD, IBS, chronic kidney disease, hypertension, migraine, CHF, chronic pain, major depressive disorder, chronic PTSD and generalized anxiety disorder.  Patient is on multiple medication including blood thinner and recently seen in the emergency room.  Review collateral information from other provider, blood work results, current medication.  Will try lamotrigine  75 mg twice a day, currently taking 50 mg twice a day.  Continue Sinequan  25 mg at bedtime and Rexulti  3 mg daily.  Encouraged to keep appointment with Ronal Sink.  Encouraged to keep all her appointment with her GI, cardiology, primary care and hematology.  Her last hemoglobin A1c normal.  She is on Ozempic  which is helping her blood sugar and weight.  Recommend to call back if she has any question or any concern.  Discussed interaction of psychotropic medication with pain medicine.  Will follow-up in 3 months unless patient needed sooner appointment.   Follow Up Instructions:     I discussed the assessment and treatment plan with  the patient. The patient was provided an opportunity to ask questions and all were answered. The patient agreed with the plan and demonstrated an understanding of the instructions.   The patient was advised to call back or seek an in-person evaluation if the symptoms worsen or if the condition fails to improve as anticipated.    Collaboration of Care: Other provider involved in patient's care AEB notes are available in epic to review  Patient/Guardian was advised Release of Information must be obtained prior to any record release in order to collaborate their care with an outside provider. Patient/Guardian was advised if they have not already done so to contact the registration department to sign all necessary forms in order for us  to release information regarding their care.   Consent: Patient/Guardian gives verbal consent for treatment and assignment of benefits for services provided during this visit. Patient/Guardian expressed understanding and agreed to proceed.     Total encounter time 28 minutes which includes face-to-face time, chart reviewed, care coordination, order entry and documentation during this encounter.   Note: This document was prepared by [employer] voice dictation technology and any errors that results from this process are unintentional.    Leni ONEIDA Client, MD 08/29/2023

## 2023-09-01 ENCOUNTER — Inpatient Hospital Stay: Attending: Hematology and Oncology | Admitting: Hematology and Oncology

## 2023-09-01 ENCOUNTER — Encounter: Payer: Self-pay | Admitting: Hematology and Oncology

## 2023-09-01 ENCOUNTER — Other Ambulatory Visit

## 2023-09-01 ENCOUNTER — Telehealth: Payer: Self-pay

## 2023-09-01 VITALS — BP 139/95 | HR 91 | Temp 99.0°F | Resp 18 | Ht 71.0 in | Wt 339.8 lb

## 2023-09-01 DIAGNOSIS — Z87891 Personal history of nicotine dependence: Secondary | ICD-10-CM | POA: Insufficient documentation

## 2023-09-01 DIAGNOSIS — E538 Deficiency of other specified B group vitamins: Secondary | ICD-10-CM | POA: Insufficient documentation

## 2023-09-01 DIAGNOSIS — D509 Iron deficiency anemia, unspecified: Secondary | ICD-10-CM | POA: Diagnosis present

## 2023-09-01 DIAGNOSIS — E611 Iron deficiency: Secondary | ICD-10-CM

## 2023-09-01 NOTE — Progress Notes (Signed)
  [facility]cer Center CONSULT NOTE  Patient Care Team: Catalina Bare, MD as PCP - General (Internal Medicine) Levern Hutching, MD as PCP - Cardiology (Cardiology) Slusher, [name] LABOR, RN as Registered Nurse  ASSESSMENT & PLAN:  Iron  deficiency The most likely cause of her anemia is due to chronic blood loss/malabsorption syndrome and possibly poor dietary oral iron  intake. We discussed some of the risks, benefits, and alternatives of intravenous iron  infusions. The patient is symptomatic from anemia and the iron  level is critically low. She tolerated oral iron  supplement poorly and desires to achieved higher levels of iron  faster for adequate hematopoesis. Some of the side-effects to be expected including risks of infusion reactions, phlebitis, headaches, nausea and fatigue.  The patient is willing to proceed. Patient education material was dispensed.  Goal is to keep ferritin level greater than 50 and resolution of anemia She tolerated intravenous Feraheme  well in the past I recommend 2 doses in I plan to see her again in 3 months for further follow-up  Vitamin B12 deficiency She had prior history of vitamin B12 deficiency I plan to recheck B12 level in her next visit Orders Placed This Encounter  Procedures   Ferritin    Standing Status:   Future    Expiration Date:   08/31/2024   Iron  and Iron  Binding Capacity ([facility] only)    Standing Status:   Future    Expiration Date:   08/31/2024   CBC with Differential (Cancer Center Only)    Standing Status:   Future    Expiration Date:   08/31/2024   Vitamin B12    Standing Status:   Future    Expiration Date:   08/31/2024    All questions were answered. The patient knows to call the clinic with any problems, questions or concerns.  The total time spent in the appointment was 60 minutes encounter with patients including review of chart and various tests results, discussions about plan of care and coordination of care plan  Almarie Bedford,  MD 8/4/202512:01 PM   CHIEF COMPLAINTS/PURPOSE OF CONSULTATION:  Anemia  HISTORY OF PRESENTING ILLNESS:  Lindsay [name] 53 y.o. female is here because of anemia  She was found to have abnormal CBC from recent blood work She has diagnosis of anemia dating back to many years I have the opportunity to review her medical records dated back to 2008 She was noted to have iron  deficiency anemia in 2020 and received 2 doses of intravenous iron  with dramatic improvement of his blood count, as high as 15 in 2021 Between end of 2021 to present, her hemoglobin fluctuated up and down Her blood count could be as low as 9.3 on August 11, 2022 but improved to 11.9 on Jun 26, 2023 In the past, the patient has menorrhagia She has Nexplanon  implanted approximately a year ago and she denies further menorrhagia She is chronic oxygen  dependent and has chronic shortness of breath at rest  She had not noticed any recent bleeding such as epistaxis, hematuria or hematochezia The patient denies over the counter NSAID ingestion. She is not on antiplatelets agents. She was not able to get colonoscopy done due to her pulmonary status She had no prior history or diagnosis of cancer. Her age appropriate screening programs are up-to-date. She denies any pica and eats a variety of diet.  However, in going through her diet history, the patient has very low iron  rich food intake She never donated blood or received blood transfusion The patient was prescribed  oral iron  supplements and she takes them for a while but it caused a lot of GI upset including constipation  MEDICAL HISTORY:  Past Medical History:  Diagnosis Date   Acid reflux    Anemia    Iron  Def   Anorexia    CHF (congestive heart failure) (HCC)    Chronic kidney disease    Nephrotic syndrome   Colon polyp 2009   Depression with anxiety    Edema leg    Fibroid tumor 04/2022   Fibromyalgia    Hemorrhoids    Hidradenitis suppurativa    Hypertension     IBS (irritable bowel syndrome)    Low back pain    Migraines    Morbidly obese (HCC)    Neuromuscular disorder (HCC)    fibromyalgia   Neuropathy    Panic attacks    Polyp of vocal cord or larynx    Stroke (HCC)    Tonsil pain     SURGICAL HISTORY: Past Surgical History:  Procedure Laterality Date   AXILLARY HIDRADENITIS EXCISION     COLONOSCOPY WITH PROPOFOL  N/A 05/25/2015   Procedure: COLONOSCOPY WITH PROPOFOL ;  Surgeon: Toribio SHAUNNA Cedar, MD;  Location: [facility];  Service: Endoscopy;  Laterality: N/A;   COLONOSCOPY WITH PROPOFOL  N/A 12/31/2018   Procedure: COLONOSCOPY WITH PROPOFOL ;  Surgeon: Cedar Toribio SHAUNNA, MD;  Location: [facility];  Service: Endoscopy;  Laterality: N/A;   HEMORRHOID SURGERY     with Hidradenitis surgery    INGUINAL HIDRADENITIS EXCISION     RIGHT/LEFT HEART CATH AND CORONARY ANGIOGRAPHY N/A 06/26/2023   Procedure: RIGHT/LEFT HEART CATH AND CORONARY ANGIOGRAPHY;  Surgeon: Claudene Pacific, MD;  Location: [facility];  Service: Cardiovascular;  Laterality: N/A;   TONSILLECTOMY  10/18/2010   by Dr. Mable   UPPER GASTROINTESTINAL ENDOSCOPY      SOCIAL HISTORY: Social History   Socioeconomic History   Marital status: Single    Spouse name: Not on file   Number of children: 1   Years of education: 11   Highest education level: Not on file  Occupational History   Occupation: Disabled  Tobacco Use   Smoking status: Former    Current packs/day: 0.00    Average packs/day: 0.1 packs/day for 25.0 years (2.5 ttl pk-yrs)    Types: Cigarettes    Start date: 05/24/1994    Quit date: 05/24/2019    Years since quitting: 4.2   Smokeless tobacco: Never   Tobacco comments:    quit in April.  Vaping Use   Vaping status: Never Used  Substance and Sexual Activity   Alcohol use: Not Currently    Alcohol/week: 0.0 standard drinks of alcohol    Comment: none sine 05/2018   Drug use: Not Currently    Frequency: 3.0 times per week    Types: Marijuana    Sexual activity: Yes    Birth control/protection: Implant    Comment: placed in March 2024  Other Topics Concern   Not on file  Social History Narrative   Lives at home w/ her mother and daughter   Right-handed   Caffeine: about 3 Cokes per week   Social Drivers of Health   Financial Resource Strain: High Risk (06/05/2023)   Overall Financial Resource Strain (CARDIA)    Difficulty of Paying Living Expenses: Very hard  Food Insecurity: Food Insecurity Present (06/25/2023)   Hunger Vital Sign    Worried About Running Out of Food in the Last Year: Often true  Ran Out of Food in the Last Year: Often true  Transportation Needs: Unmet Transportation Needs (06/25/2023)   PRAPARE - Transportation    Lack of Transportation (Medical): Yes    Lack of Transportation (Non-Medical): Yes  Physical Activity: Unknown (05/27/2023)   Received from [facility]   Exercise Vital Sign    On average, how many days per week do you engage in moderate to strenuous exercise (like a brisk walk)?: 0 days    Minutes of Exercise per Session: Not on file  Stress: Stress Concern Present (05/27/2023)   Received from [facility] - Occupational Stress Questionnaire    Feeling of Stress : To some extent  Social Connections: Socially Isolated (05/27/2023)   Received from [facility]   Social Network    How would you rate your social network (family, work, friends)?: Little participation, lonely and socially isolated  Intimate Partner Violence: Not At Risk (06/25/2023)   Humiliation, Afraid, Rape, and Kick questionnaire    Fear of Current or Ex-Partner: No    Emotionally Abused: No    Physically Abused: No    Sexually Abused: No    FAMILY HISTORY: Family History  Problem Relation Age of Onset   Diabetes Mother    Heart disease Mother        valve leak   Anxiety disorder Mother    Depression Mother    High blood pressure Mother    Kidney failure Mother     Cancer Father        prostate   Heart disease Father    Learning disabilities Sister    Depression Sister    Depression Sister    Anxiety disorder Sister    Colon cancer Neg Hx     ALLERGIES:  is allergic to lisinopril .  MEDICATIONS:  Current Outpatient Medications  Medication Sig Dispense Refill   ACCU-CHEK GUIDE test strip USE ONE TEST STRIP TO CHECK BLOOD SUGARS ONCE A DAY AS NEEDED 100 strip 1   Accu-Chek Softclix Lancets lancets USE ONE LANCET TO CHECK BLOOD SUGAR ONE A DAY AS NEEDED 100 each 1   acetaminophen  (TYLENOL ) 500 MG tablet Take 1 tablet (500 mg total) by mouth every 6 (six) hours as needed. (Patient taking differently: Take 500 mg by mouth every 6 (six) hours as needed for mild pain (pain score 1-3).) 30 tablet 0   albuterol  (PROVENTIL ) (2.5 MG/3ML) 0.083% nebulizer solution Take 3 mLs (2.5 mg total) by nebulization every 6 (six) hours as needed for wheezing or shortness of breath. 150 mL 0   albuterol  (VENTOLIN  HFA) 108 (90 Base) MCG/ACT inhaler 2 PUFFS INHALED EVERY 6 HOURS AS NEEDED FOR 30 DAYS 6.7 each 1   amoxicillin  (AMOXIL ) 500 MG capsule Take 500 mg by mouth 3 (three) times daily.     atorvastatin  (LIPITOR) 40 MG tablet TAKE 1 TABLET BY MOUTH EVERY DAY 90 tablet 3   Azelastine  HCl 137 MCG/SPRAY SOLN PLACE 1 SPRAY INTO BOTH NOSTRILS 2 (TWO) TIMES DAILY. USE IN EACH NOSTRIL AS DIRECTED (Patient taking differently: Place 1 spray into both nostrils daily as needed (for allergies).) 90 mL 4   Brexpiprazole  (REXULTI ) 3 MG TABS Take 1 tablet (3 mg total) by mouth daily. 30 tablet 2   budesonide -glycopyrrolate-formoterol  (BREZTRI  AEROSPHERE) 160-9-4.8 MCG/ACT AERO inhaler Inhale 2 puffs into the lungs in the morning and at bedtime. 10.7 g 3   cetirizine  (ZYRTEC ) 10 MG tablet Take 1 tablet (10 mg total) by mouth  daily. (Patient taking differently: Take 10 mg by mouth every other day.) 90 tablet 1   clindamycin  (CLEOCIN ) 300 MG capsule Take 1 capsule (300 mg total) by mouth 4  (four) times daily. X 7 days 28 capsule 0   dicyclomine  (BENTYL ) 10 MG capsule Take 1 capsule (10 mg total) by mouth 3 (three) times daily as needed for spasms (AB pain, diarrhea). 90 capsule 2   diltiazem  (CARDIZEM  CD) 120 MG 24 hr capsule Take 1 capsule (120 mg total) by mouth daily. 30 capsule 6   doxepin  (SINEQUAN ) 25 MG capsule Take 1 capsule (25 mg total) by mouth at bedtime. 30 capsule 2   ENTRESTO  24-26 MG TAKE 1 TABLET BY MOUTH TWICE A DAY 60 tablet 0   ferrous sulfate  325 (65 FE) MG tablet Take 1 tablet (325 mg total) by mouth daily with breakfast. 30 tablet 3   fluconazole  (DIFLUCAN ) 150 MG tablet Take 150 mg by mouth 2 (two) times daily.     fluticasone  (FLONASE ) 50 MCG/ACT nasal spray SPRAY 1 SPRAY INTO EACH NOSTRIL DAILY (Patient taking differently: Place 1 spray into both nostrils daily as needed for allergies.) 32 mL 1   guaiFENesin  (ROBITUSSIN) 100 MG/5ML liquid Take 10 mLs by mouth every 6 (six) hours as needed for cough or to loosen phlegm. 120 mL 0   HYDROcodone -acetaminophen  (NORCO/VICODIN) 5-325 MG tablet Take 2 tablets by mouth every 4 (four) hours as needed. 10 tablet 0   hydrocortisone  (ANUSOL -HC) 2.5 % rectal cream PLACE 1 APPLICATION RECTALLY 2 (TWO) TIMES DAILY. 30 g 2   ipratropium (ATROVENT ) 0.06 % nasal spray Place 2 sprays into both nostrils daily as needed for rhinitis.     ipratropium-albuterol  (DUONEB) 0.5-2.5 (3) MG/3ML SOLN Take 3 mLs by nebulization 2 (two) times daily. 180 mL 1   lamoTRIgine  (LAMICTAL ) 150 MG tablet Take 1/2 tab twice  daily 30 tablet 2   LINZESS  290 MCG CAPS capsule TAKE 1 CAPSULE BY MOUTH DAILY BEFORE BREAKFAST. 90 capsule 0   meclizine  (ANTIVERT ) 25 MG tablet Take 25 mg by mouth 3 (three) times daily.     metoprolol  succinate (TOPROL -XL) 100 MG 24 hr tablet Take 1 tablet (100 mg total) by mouth at bedtime. 30 tablet 0   omeprazole  (PRILOSEC) 40 MG capsule TAKE 1 CAPSULE (40 MG TOTAL) BY MOUTH DAILY. 90 capsule 0   ondansetron  (ZOFRAN -ODT) 8  MG disintegrating tablet Take 1 tablet (8 mg total) by mouth every 8 (eight) hours as needed. 90 tablet 0   oxyCODONE  (OXY IR/ROXICODONE ) 5 MG immediate release tablet Take 5 mg by mouth 2 (two) times daily.     oxyCODONE -acetaminophen  (PERCOCET/ROXICET) 5-325 MG tablet Take by mouth.     predniSONE  (DELTASONE ) 20 MG tablet TAKE 1 TABLET BY MOUTH DAILY WITH BREAKFAST 7 tablet 0   promethazine  (PHENERGAN ) 25 MG tablet Take 1 tablet (25 mg total) by mouth every 6 (six) hours as needed for nausea or vomiting. 120 tablet 1   Safety Seal Miscellaneous MISC Apply 1 Application topically in the morning. Medication Name; AA Gel 30 g 6   Semaglutide , 2 MG/DOSE, (OZEMPIC , 2 MG/DOSE,) 8 MG/3ML SOPN INJECT 2 MG INTO THE SKIN ONCE A WEEK. 9 mL 1   SUMAtriptan  (IMITREX ) 25 MG tablet Take 1 tablet (25 mg total) by mouth once for 1 dose. May repeat in 2 hours if headache persists or recurs. 10 tablet 0   SYMBICORT  160-4.5 MCG/ACT inhaler Inhale 2 puffs into the lungs 2 (two) times daily. (Patient  not taking: Reported on 07/21/2023)     tiZANidine  (ZANAFLEX ) 4 MG tablet Take 4 mg by mouth every 8 (eight) hours as needed for muscle spasms.     torsemide  (DEMADEX ) 20 MG tablet TAKE 1 TABLET BY MOUTH TWICE A DAY 180 tablet 1   No current facility-administered medications for this visit.    REVIEW OF SYSTEMS:   All other systems were reviewed with the patient and are negative.  PHYSICAL EXAMINATION: ECOG PERFORMANCE STATUS: 2 - Symptomatic, <50% confined to bed  Vitals:   09/01/23 0942  BP: (!) 139/95  Pulse: 91  Resp: 18  Temp: 99 F (37.2 C)  SpO2: 95%   Filed Weights   09/01/23 0942  Weight: (!) 339 lb 12.8 oz (154.1 kg)    GENERAL:alert, no distress and comfortable.  Limited examination due to body habitus and sitting on the wheelchair SKIN: skin color, texture, turgor are normal, no rashes or significant lesions EYES: normal, conjunctiva are pink and non-injected, sclera clear OROPHARYNX:no  exudate, no erythema and lips, buccal mucosa, and tongue normal  NECK: supple, thyroid  normal size, non-tender, without nodularity LYMPH:  no palpable lymphadenopathy in the cervical, axillary or inguinal LUNGS: clear to auscultation and percussion with normal breathing effort HEART: regular rate & rhythm and no murmurs and no lower extremity edema ABDOMEN:abdomen soft, non-tender and normal bowel sounds Musculoskeletal:no cyanosis of digits and no clubbing  PSYCH: alert & oriented x 3 with fluent speech NEURO: no focal motor/sensory deficits  RADIOGRAPHIC STUDIES: I have personally reviewed the radiological images as listed and agreed with the findings in the report. DG Chest Port 1 View Result Date: 08/28/2023 CLINICAL DATA:  SOB EXAM: PORTABLE CHEST - 1 VIEW COMPARISON:  May 23, 2023 FINDINGS: Low lung volumes with bronchovascular crowding. No focal airspace consolidation, pleural effusion, or pneumothorax. No cardiomegaly. No acute fracture or destructive lesion. IMPRESSION: Low lung volumes.  Otherwise, no acute cardiopulmonary abnormality. Electronically Signed   By: Rogelia Myers M.D.   On: 08/28/2023 10:58

## 2023-09-01 NOTE — Assessment & Plan Note (Signed)
 She had prior history of vitamin B12 deficiency I plan to recheck B12 level in her next visit

## 2023-09-01 NOTE — Telephone Encounter (Signed)
 Dr. Lonn, patient will be scheduled as soon as possible.  Auth Submission: NO AUTH NEEDED Site of care: Site of care: [facility] Payer: [employer] dual complete medicare Medication & CPT/J Code(s) submitted: Feraheme  (ferumoxytol ) [id number] Diagnosis Code:  Route of submission (phone, fax, portal):  Phone # Fax # Auth type: Buy/Bill PB Units/visits requested: 510mg  x 2 doses Reference number:  Approval from: 09/01/23 to 01/01/24

## 2023-09-01 NOTE — Assessment & Plan Note (Signed)
 The most likely cause of her anemia is due to chronic blood loss/malabsorption syndrome and possibly poor dietary oral iron  intake. We discussed some of the risks, benefits, and alternatives of intravenous iron  infusions. The patient is symptomatic from anemia and the iron  level is critically low. She tolerated oral iron  supplement poorly and desires to achieved higher levels of iron  faster for adequate hematopoesis. Some of the side-effects to be expected including risks of infusion reactions, phlebitis, headaches, nausea and fatigue.  The patient is willing to proceed. Patient education material was dispensed.  Goal is to keep ferritin level greater than 50 and resolution of anemia She tolerated intravenous Feraheme  well in the past I recommend 2 doses in I plan to see her again in 3 months for further follow-up

## 2023-09-02 ENCOUNTER — Other Ambulatory Visit: Payer: Self-pay | Admitting: Pulmonary Disease

## 2023-09-05 ENCOUNTER — Ambulatory Visit (HOSPITAL_COMMUNITY): Admitting: Licensed Clinical Social Worker

## 2023-09-05 DIAGNOSIS — F410 Panic disorder [episodic paroxysmal anxiety] without agoraphobia: Secondary | ICD-10-CM | POA: Diagnosis not present

## 2023-09-05 DIAGNOSIS — F4312 Post-traumatic stress disorder, chronic: Secondary | ICD-10-CM

## 2023-09-05 DIAGNOSIS — F411 Generalized anxiety disorder: Secondary | ICD-10-CM | POA: Diagnosis not present

## 2023-09-05 DIAGNOSIS — F331 Major depressive disorder, recurrent, moderate: Secondary | ICD-10-CM | POA: Diagnosis not present
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
## 2023-09-07 ENCOUNTER — Other Ambulatory Visit: Payer: Self-pay | Admitting: Physician Assistant

## 2023-09-08 ENCOUNTER — Other Ambulatory Visit: Payer: Self-pay

## 2023-09-08 ENCOUNTER — Telehealth: Payer: Self-pay

## 2023-09-08 NOTE — Telephone Encounter (Signed)
 penny nurse case manager with [facility] trying to get a cpap for ms Briseno . They sent a order over to [facility] and [facility] is saying they didn't get new order  have not received a new order . penny is requesting a new order be sent over to [facility] for patient to receive cpap  [facility] call back  [phone]

## 2023-09-08 NOTE — Patient Outreach (Addendum)
 Contacted a representative from [employer], states they do not show an order for CPAP in the system.

## 2023-09-08 NOTE — Patient Outreach (Addendum)
 Complex Care Management   Visit Note  09/08/2023  Name:  Lindsay [name] MRN: [id number] DOB: August 22, 1970  Situation: Referral received for Complex Care Management related to Heart Failure and COPD I obtained verbal consent from Patient.  Visit completed with Lindsay [name]  on the phone. Main concern was getting CPAP ordered, tooth pain, vaginal itching.   Background:   Past Medical History:  Diagnosis Date   Acid reflux    Anemia    Iron  Def   Anorexia    CHF (congestive heart failure) (HCC)    Chronic kidney disease    Nephrotic syndrome   Colon polyp 2009   Depression with anxiety    Edema leg    Fibroid tumor 04/2022   Fibromyalgia    Hemorrhoids    Hidradenitis suppurativa    Hypertension    IBS (irritable bowel syndrome)    Low back pain    Migraines    Morbidly obese (HCC)    Neuromuscular disorder (HCC)    fibromyalgia   Neuropathy    Panic attacks    Polyp of vocal cord or larynx    Stroke (HCC)    Tonsil pain     Assessment: Patient Reported Symptoms:  Cognitive Cognitive Status: Alert and oriented to person, place, and time, Normal speech and language skills, Insightful and able to interpret abstract concepts      Neurological Neurological Review of Symptoms: Weakness    HEENT HEENT Symptoms Reported: No symptoms reported, Mouth or teeth pain HEENT Comment: Went to ED 08/28/23 for tooth pain, prescribed clindamycin  300mg  4x day She already has oxycodone /APAP prescribed.    Cardiovascular Cardiovascular Symptoms Reported: Fatigue    Respiratory Respiratory Symptoms Reported: Shortness of breath Other Respiratory Symptoms: Continues to have shortness of breath, went to ED 08/28/23, xray of lungs showed Low lung volumes.  Otherwise, no acute cardiopulmonary abnormality.  Instructed to call PCP for F/U appointment, encouraged patient to call.    Endocrine Endocrine Symptoms Reported: Not assessed    Gastrointestinal Gastrointestinal Symptoms Reported: Not  assessed      Genitourinary Genitourinary Symptoms Reported: Itching or irritation Additional Genitourinary Details: Patient was given clindamycin  at ED visit, states she feels she now has a yeast infection and is requesting a script for fluconazole .  Instructed patient to call PCP office because they may require an in-office visit before prescribing.    Integumentary Integumentary Symptoms Reported: Not assessed    Musculoskeletal Musculoskelatal Symptoms Reviewed: Not assessed        Psychosocial Psychosocial Symptoms Reported: Not assessed            05/28/2023    1:32 PM  Depression screen PHQ 2/9  Decreased Interest 1  Down, Depressed, Hopeless 1  PHQ - 2 Score 2  Altered sleeping 2  Tired, decreased energy 2  Change in appetite 1  Feeling bad or failure about yourself  0  Trouble concentrating 1  Moving slowly or fidgety/restless 1  Suicidal thoughts 0  PHQ-9 Score 9  Difficult doing work/chores Somewhat difficult    There were no vitals filed for this visit.  Medications Reviewed Today   Medications were not reviewed in this encounter     Recommendation:  Acute PCP follow up: needs to call and inquire about an order for fluconazole  due to symptom of vaginal itching after taking antibiotic. She is aware PCP office may need to have face-to-face visit.  Specialty provider follow-up :Pulmonary 09/16/23 - needs to discuss CPAP.  -This RNCM called  [facility] for status of CPAP, staff states they did not receive an order.  Called and left message at [facility] office to request they refax CPAP order to [facility].   Follow Up Plan:   Telephone follow-up three days.  [name] Stamp BSN, CCM [place]  VBCI Population Health RN Care Manager Direct Dial: [phone]  Fax: [phone]

## 2023-09-08 NOTE — Patient Outreach (Signed)
 Spoke with staff, Whitney, left message that [facility] states they didn't receive the order for CPAP for Ms. Yates, please refax information.

## 2023-09-08 NOTE — Patient Outreach (Signed)
 Contacted staff Microbiologist) at [facility], left message to inform [facility] did not receive order for CPAP, please refax information. Left my contact information.

## 2023-09-08 NOTE — Patient Instructions (Signed)
 Visit Information  Thank you for taking time to visit with me today. Please don't hesitate to contact me if I can be of assistance to you before our next scheduled appointment.  Your next care management appointment is by telephone on Monday, August 25th at 11:00am.    Please call the care guide team at [phone] if you need to cancel, schedule, or reschedule an appointment.   A reminder to ALL patients/family/friends, please call the [place]  National Suicide Prevention Lifeline: (412) 881-8434 or TTY: 867-710-3372 TTY 503-020-0249) to talk to a trained counselor if you are experiencing a Mental Health or Behavioral Health Crisis or need someone to talk to.  [name] Stamp BSN, CCM [place]  VBCI Population Health RN Care Manager Direct Dial: [phone]  Fax: [phone]

## 2023-09-09 ENCOUNTER — Encounter: Payer: Self-pay | Admitting: Pulmonary Disease

## 2023-09-12 ENCOUNTER — Other Ambulatory Visit: Payer: Self-pay

## 2023-09-12 ENCOUNTER — Encounter: Payer: Self-pay | Admitting: Hematology and Oncology

## 2023-09-12 ENCOUNTER — Emergency Department (HOSPITAL_COMMUNITY): Admission: EM | Admit: 2023-09-12 | Discharge: 2023-09-12 | Disposition: A | Discharge status: Home / Self Care

## 2023-09-12 ENCOUNTER — Emergency Department (HOSPITAL_COMMUNITY)

## 2023-09-12 ENCOUNTER — Encounter (HOSPITAL_COMMUNITY): Payer: Self-pay

## 2023-09-12 ENCOUNTER — Ambulatory Visit (INDEPENDENT_AMBULATORY_CARE_PROVIDER_SITE_OTHER): Admitting: Licensed Clinical Social Worker

## 2023-09-12 DIAGNOSIS — F410 Panic disorder [episodic paroxysmal anxiety] without agoraphobia: Secondary | ICD-10-CM

## 2023-09-12 DIAGNOSIS — F4312 Post-traumatic stress disorder, chronic: Secondary | ICD-10-CM | POA: Diagnosis not present

## 2023-09-12 DIAGNOSIS — F331 Major depressive disorder, recurrent, moderate: Secondary | ICD-10-CM | POA: Diagnosis not present

## 2023-09-12 DIAGNOSIS — R0602 Shortness of breath: Secondary | ICD-10-CM | POA: Diagnosis present

## 2023-09-12 DIAGNOSIS — F411 Generalized anxiety disorder: Secondary | ICD-10-CM | POA: Diagnosis not present

## 2023-09-12 DIAGNOSIS — Z794 Long term (current) use of insulin: Secondary | ICD-10-CM | POA: Diagnosis not present

## 2023-09-12 DIAGNOSIS — Z7951 Long term (current) use of inhaled steroids: Secondary | ICD-10-CM | POA: Diagnosis not present

## 2023-09-12 DIAGNOSIS — J449 Chronic obstructive pulmonary disease, unspecified: Secondary | ICD-10-CM | POA: Diagnosis not present

## 2023-09-12 DIAGNOSIS — I5030 Unspecified diastolic (congestive) heart failure: Secondary | ICD-10-CM | POA: Insufficient documentation

## 2023-09-12 DIAGNOSIS — U071 COVID-19: Secondary | ICD-10-CM | POA: Diagnosis not present

## 2023-09-12 LAB — COMPREHENSIVE METABOLIC PANEL WITH GFR
ALT: 12 U/L (ref 0–44)
AST: 15 U/L (ref 15–41)
Albumin: 3.7 g/dL (ref 3.5–5.0)
Alkaline Phosphatase: 72 U/L (ref 38–126)
Anion gap: 11 (ref 5–15)
BUN: 7 mg/dL (ref 6–20)
CO2: 22 mmol/L (ref 22–32)
Calcium: 9.2 mg/dL (ref 8.9–10.3)
Chloride: 104 mmol/L (ref 98–111)
Creatinine, Ser: 1.01 mg/dL — ABNORMAL HIGH (ref 0.44–1.00)
GFR, Estimated: >60 mL/min (ref >60)
Glucose, Bld: 93 mg/dL (ref 70–99)
Potassium: 3.6 mmol/L (ref 3.5–5.1)
Sodium: 137 mmol/L (ref 135–145)
Total Bilirubin: 0.5 mg/dL (ref 0.0–1.2)
Total Protein: 7.3 g/dL (ref 6.5–8.1)

## 2023-09-12 LAB — CBC WITH DIFFERENTIAL/PLATELET
Abs Immature Granulocytes: 0.02 K/uL (ref 0.00–0.07)
Basophils Absolute: 0 K/uL (ref 0.0–0.1)
Basophils Relative: 1 %
Eosinophils Absolute: 0 K/uL (ref 0.0–0.5)
Eosinophils Relative: 0 %
HCT: 38.2 % (ref 36.0–46.0)
Hemoglobin: 12 g/dL (ref 12.0–15.0)
Immature Granulocytes: 0 %
Lymphocytes Relative: 23 %
Lymphs Abs: 1.4 K/uL (ref 0.7–4.0)
MCH: 25.9 pg — ABNORMAL LOW (ref 26.0–34.0)
MCHC: 31.4 g/dL (ref 30.0–36.0)
MCV: 82.3 fL (ref 80.0–100.0)
Monocytes Absolute: 1 K/uL (ref 0.1–1.0)
Monocytes Relative: 16 %
Neutro Abs: 3.7 K/uL (ref 1.7–7.7)
Neutrophils Relative %: 60 %
Platelets: 387 K/uL (ref 150–400)
RBC: 4.64 MIL/uL (ref 3.87–5.11)
RDW: 15.1 % (ref 11.5–15.5)
WBC: 6.1 K/uL (ref 4.0–10.5)
nRBC: 0 % (ref 0.0–0.2)

## 2023-09-12 LAB — RESP PANEL BY RT-PCR (RSV, FLU A&B, COVID)  RVPGX2
Influenza A by PCR: NEGATIVE
Influenza B by PCR: NEGATIVE
Resp Syncytial Virus by PCR: NEGATIVE
SARS Coronavirus 2 by RT PCR: POSITIVE — AB

## 2023-09-12 LAB — CBG MONITORING, ED: Glucose-Capillary: 99 mg/dL (ref 70–99)

## 2023-09-12 LAB — TYPE AND SCREEN
ABO/RH(D): O POS
Antibody Screen: NEGATIVE

## 2023-09-12 LAB — LIPASE, BLOOD: Lipase: 23 U/L (ref 11–51)

## 2023-09-12 LAB — TROPONIN I (HIGH SENSITIVITY)
Troponin I (High Sensitivity): 3 ng/L (ref <18)
Troponin I (High Sensitivity): 3 ng/L (ref <18)

## 2023-09-12 LAB — I-STAT CG4 LACTIC ACID, ED: Lactic Acid, Venous: 0.6 mmol/L (ref 0.5–1.9)

## 2023-09-12 LAB — TSH: TSH: 0.255 u[IU]/mL — ABNORMAL LOW (ref 0.350–4.500)

## 2023-09-12 LAB — BRAIN NATRIURETIC PEPTIDE: B Natriuretic Peptide: 34.3 pg/mL (ref 0.0–100.0)

## 2023-09-12 MED ORDER — ACETAMINOPHEN 500 MG PO TABS
1000.0000 mg | ORAL_TABLET | Freq: Once | ORAL | Status: AC
  Administered 2023-09-12: 1000 mg via ORAL

## 2023-09-12 MED ORDER — BUMETANIDE 0.25 MG/ML IJ SOLN
2.0000 mg | Freq: Once | INTRAMUSCULAR | Status: AC
  Administered 2023-09-12: 2 mg via INTRAVENOUS
  Filled 2023-09-12: qty 4
  Filled 2023-09-12: qty 8

## 2023-09-12 MED ORDER — ONDANSETRON HCL 4 MG/2ML IJ SOLN
4.0000 mg | Freq: Once | INTRAMUSCULAR | Status: AC
  Administered 2023-09-12: 4 mg via INTRAVENOUS
  Filled 2023-09-12: qty 2

## 2023-09-12 NOTE — ED Provider Triage Note (Signed)
 Emergency Medicine Provider Triage Evaluation Note  Lindsay [name] , a 53 y.o. female  was evaluated in triage.  Pt complains of several days of fevers, chills, productive cough, shortness of breath, hypoxia on her home 5 L, malaise, fatigue, and dark tarry stools.  Review of Systems  Positive: Productive cough, shortness of breath, hypoxia of 80% on home 5 L, aching and soreness, nausea, fatigue, dark stools Negative: Chest pain, abdominal pain, back pain, flank pain  Physical Exam  BP (!) 153/86   Pulse (!) 106   Temp 99.9 F (37.7 C)   Resp 16   Ht 5' 11 (1.803 m)   Wt (!) 149.7 kg   LMP 08/20/2023   SpO2 97%   BMI 46.03 kg/m  Gen:   Awake, no distress, diaphoretic and has increased work of breathing Resp:  Slightly tachypneic, some faint rhonchi, diaphoresis MSK:   Moves extremities without difficulty  Other:  Patient slightly ill-appearing and diaphoretic  Medical Decision Making  Medically screening exam initiated at 9:29 AM.  Appropriate orders placed.  [name] was informed that the remainder of the evaluation will be completed by another provider, this initial triage assessment does not replace that evaluation, and the importance of remaining in the ED until their evaluation is complete.  Lindsay [name] is a 52 y.o. female with a past medical history significant for hypertension, anxiety, migraines, Pharm allergy, COPD on 5 L home oxygen , CHF, previous stroke, and hidradenitis who presents with fever of 102.6, malaise, fatigue, chills, diaphoresis, shortness of breath, hypoxia at home, productive cough, and dark tarry stools.  Patient reports that for the last 2 days she has had the symptoms that started with fevers and chills.  She denies any sick contacts and does not know anyone else that has COVID or flu around her.  She reports no leg pain or leg swelling and does not feel like she holding onto extra fluid but is had worsened breathing.  She reports her oxygen   saturations were 80% on her home 5 L.  She has had productive cough with phlegm but no hemoptysis reported.  She denies any chest pain or palpitations.  She reports some nausea no vomiting.  No constipation, diarrhea, or urinary changes but she does report she has had some dark tarry stools recently.  No reported history of GI bleeds.  She is not reporting any headache or neck pain or neck stiffness.    On exam, patient does have some faint rhonchi she is warm to the touch and is tachycardic.  She was intermittently tachypneic but she is technically afebrile on oral temperature.  Chest is nontender abdomen is nontender but she is diaphoretic.  Clinically I am concerned about viral infection such as COVID flu RSV versus pneumonia versus other chronic lung disease exacerbation.  She was not significantly wheezing but I am concerned about her reporting 80% oxygen  on her home 5 L.  She will get x-ray look for pneumonia, some labs, viral swab, and with her diaphoresis and shortness of breath worsened will also get a troponin.  She was reporting dark tarry stools will get a type and screen.  Will defer to further examining team if she needs a fecal occult test.  Due to her reported hypoxia and symptoms, dissipate disposition determination after workup.         Lorrene Graef, Lonni PARAS, MD 09/12/23 [phone]

## 2023-09-12 NOTE — Discharge Instructions (Signed)
 Thank you for allowing us  to care for you today.  You were diagnosed with COVID.  Please be sure to follow-up with your primary care provider and return to the emergency department if you experience worsening chest pain, shortness of breath, inability to tolerate anything by mouth secondary to vomiting, passout or believe you need emergent medical care

## 2023-09-12 NOTE — Telephone Encounter (Addendum)
 Patient's order was sent to [facility] and not [facility]. The order has been faxed over to [facility].

## 2023-09-12 NOTE — ED Triage Notes (Signed)
 Pt c/o SHOB that has worsened past 2 days. Pt c/o fevers and body aching. Pt diaphoretic w/labored breathing on arrival. Pt states she wears 5L [place] at home.

## 2023-09-12 NOTE — ED Notes (Signed)
 Received verbal order from Dr Stevens that only one set of cultures is sufficient due to no elevation in WBC, and patient testing positive for covid.

## 2023-09-12 NOTE — ED Notes (Signed)
 Bumex  sent from main pharmacy, but only one vial sent, ordered for 2. Pharm called and second vial requested.

## 2023-09-12 NOTE — ED Provider Notes (Signed)
 [place] [facility]PARTMENT AT [facility] Provider Note   CSN: [id number] Arrival date & time: 09/12/23  9078     Patient presents with: Shortness of Breath   Lindsay [name] is a 53 y.o. female past medical history significant for HFpEF, OSA, obstructive lung disease, COPD, chronic respiratory failure on baseline 5 L nasal cannula, depression, PTSD who presents emergency department for shortness of breath, cough and muscle aches with associated nausea.  Patient states that since Wednesday she has had muscle aches, cough, worsening shortness of breath and continuous nausea.  She states that because of this nausea she has been unable to take her oral diuretics at home over the past 24 hours.  Patient denies associated chest pain.  She does endorse fevers at home.  Endorses decreased p.o. intake.   =   Shortness of Breath      Prior to Admission medications   Medication Sig Start Date End Date Taking? Authorizing Provider  ACCU-CHEK GUIDE test strip USE ONE TEST STRIP TO CHECK BLOOD SUGARS ONCE A DAY AS NEEDED 10/16/22   Forest Coy, MD  Accu-Chek Softclix Lancets lancets USE ONE LANCET TO CHECK BLOOD SUGAR ONE A DAY AS NEEDED 07/03/22   Forest Coy, MD  acetaminophen  (TYLENOL ) 500 MG tablet Take 1 tablet (500 mg total) by mouth every 6 (six) hours as needed. Patient taking differently: Take 500 mg by mouth every 6 (six) hours as needed for mild pain (pain score 1-3). 09/17/22   Charlyn Sora, MD  albuterol  (PROVENTIL ) (2.5 MG/3ML) 0.083% nebulizer solution INHALE 3 ML BY NEBULIZATION EVERY 6 HOURS AS NEEDED FOR WHEEZING OR SHORTNESS OF BREATH 09/02/23   Olalere, Adewale A, MD  albuterol  (VENTOLIN  HFA) 108 (90 Base) MCG/ACT inhaler 2 PUFFS INHALED EVERY 6 HOURS AS NEEDED FOR 30 DAYS 07/18/23   Olalere, Adewale A, MD  amoxicillin  (AMOXIL ) 500 MG capsule Take 500 mg by mouth 3 (three) times daily. 08/26/23   [provider]  atorvastatin  (LIPITOR) 40 MG tablet  TAKE 1 TABLET BY MOUTH EVERY DAY 04/14/23   Forest Coy, MD  Azelastine  HCl 137 MCG/SPRAY SOLN PLACE 1 SPRAY INTO BOTH NOSTRILS 2 (TWO) TIMES DAILY. USE IN EACH NOSTRIL AS DIRECTED Patient taking differently: Place 1 spray into both nostrils daily as needed (for allergies). 01/17/23   Forest Coy, MD  Brexpiprazole  (REXULTI ) 3 MG TABS Take 1 tablet (3 mg total) by mouth daily. 08/29/23   [name], Leni DASEN, MD  budesonide -glycopyrrolate-formoterol  (BREZTRI  AEROSPHERE) 160-9-4.8 MCG/ACT AERO inhaler Inhale 2 puffs into the lungs in the morning and at bedtime. 07/09/23   Neda Jennet LABOR, MD  cetirizine  (ZYRTEC ) 10 MG tablet Take 1 tablet (10 mg total) by mouth daily. Patient taking differently: Take 10 mg by mouth every other day. 08/05/22   Guilloud, Carolyn, MD  clindamycin  (CLEOCIN ) 300 MG capsule Take 1 capsule (300 mg total) by mouth 4 (four) times daily. X 7 days 08/28/23   Lenor Hollering, MD  dicyclomine  (BENTYL ) 10 MG capsule Take 1 capsule (10 mg total) by mouth 3 (three) times daily as needed for spasms (AB pain, diarrhea). 07/15/23   Craig Alan SAUNDERS, PA-C  diltiazem  (CARDIZEM  CD) 120 MG 24 hr capsule Take 1 capsule (120 mg total) by mouth daily. 06/27/23   Claudene Pacific, MD  doxepin  (SINEQUAN ) 25 MG capsule Take 1 capsule (25 mg total) by mouth at bedtime. 08/29/23   [name], Syed T, MD  ENTRESTO  24-26 MG TAKE 1 TABLET BY MOUTH TWICE A DAY 03/12/23  Guilloud, Carolyn, MD  fluconazole  (DIFLUCAN ) 150 MG tablet Take 150 mg by mouth 2 (two) times daily. 08/13/23   [provider]  fluticasone  (FLONASE ) 50 MCG/ACT nasal spray SPRAY 1 SPRAY INTO EACH NOSTRIL DAILY Patient taking differently: Place 1 spray into both nostrils daily as needed for allergies. 09/24/22   Guilloud, Carolyn, MD  guaiFENesin  (ROBITUSSIN) 100 MG/5ML liquid Take 10 mLs by mouth every 6 (six) hours as needed for cough or to loosen phlegm. 05/25/23   Raenelle Coria, MD  HYDROcodone -acetaminophen  (NORCO/VICODIN) 5-325 MG  tablet Take 2 tablets by mouth every 4 (four) hours as needed. 08/28/23   Lenor Hollering, MD  hydrocortisone  (ANUSOL -HC) 2.5 % rectal cream PLACE 1 APPLICATION RECTALLY 2 (TWO) TIMES DAILY. 08/12/23   Craig Alan SAUNDERS, PA-C  ipratropium (ATROVENT ) 0.06 % nasal spray Place 2 sprays into both nostrils daily as needed for rhinitis. 04/14/20   [provider]  ipratropium-albuterol  (DUONEB) 0.5-2.5 (3) MG/3ML SOLN Take 3 mLs by nebulization 2 (two) times daily. 08/12/23 09/11/23  Neda Jennet LABOR, MD  lamoTRIgine  (LAMICTAL ) 150 MG tablet Take 1/2 tab twice  daily 08/29/23   [name], Leni DASEN, MD  LINZESS  290 MCG CAPS capsule TAKE 1 CAPSULE BY MOUTH DAILY BEFORE BREAKFAST. 08/12/23   Craig Alan SAUNDERS, PA-C  meclizine  (ANTIVERT ) 25 MG tablet Take 25 mg by mouth 3 (three) times daily. 06/01/23   [provider]  metoprolol  succinate (TOPROL -XL) 100 MG 24 hr tablet Take 1 tablet (100 mg total) by mouth at bedtime. 06/27/23   Claudene Pacific, MD  omeprazole  (PRILOSEC) 40 MG capsule TAKE 1 CAPSULE (40 MG TOTAL) BY MOUTH DAILY. 01/17/23   Craig Alan SAUNDERS, PA-C  ondansetron  (ZOFRAN -ODT) 8 MG disintegrating tablet Take 1 tablet (8 mg total) by mouth every 8 (eight) hours as needed. 05/01/23   Lovie Clarity, MD  oxyCODONE  (OXY IR/ROXICODONE ) 5 MG immediate release tablet Take 5 mg by mouth 2 (two) times daily. 03/10/23   [provider]  oxyCODONE -acetaminophen  (PERCOCET/ROXICET) 5-325 MG tablet Take by mouth.    [provider]  predniSONE  (DELTASONE ) 20 MG tablet TAKE 1 TABLET BY MOUTH DAILY WITH BREAKFAST 08/12/23   Olalere, Adewale A, MD  promethazine  (PHENERGAN ) 25 MG tablet TAKE 1 TABLET BY MOUTH EVERY 6 HOURS AS NEEDED FOR NAUSEA OR VOMITING. 09/08/23   Craig Alan SAUNDERS, PA-C  Safety Seal Miscellaneous MISC Apply 1 Application topically in the morning. Medication Name; AA Gel 04/16/23   Alm Delon SAILOR, DO  Semaglutide , 2 MG/DOSE, (OZEMPIC , 2 MG/DOSE,) 8 MG/3ML SOPN INJECT 2 MG INTO  THE SKIN ONCE A WEEK. 04/14/23   Forest Coy, MD  SUMAtriptan  (IMITREX ) 25 MG tablet Take 1 tablet (25 mg total) by mouth once for 1 dose. May repeat in 2 hours if headache persists or recurs. 06/27/23 06/27/23  Claudene Pacific, MD  SYMBICORT  160-4.5 MCG/ACT inhaler Inhale 2 puffs into the lungs 2 (two) times daily. Patient not taking: Reported on 07/21/2023 05/20/23   [provider]  tiZANidine  (ZANAFLEX ) 4 MG tablet Take 4 mg by mouth every 8 (eight) hours as needed for muscle spasms. 06/04/23   [provider]  torsemide  (DEMADEX ) 20 MG tablet TAKE 1 TABLET BY MOUTH TWICE A DAY 01/27/23   Forest Coy, MD    Allergies: Lisinopril     Review of Systems  Respiratory:  Positive for shortness of breath.     Updated Vital Signs BP 111/71 (BP Location: Right Arm)   Pulse 97   Temp 98.4 F (36.9  C) (Oral)   Resp (!) 22   Ht 5' 11 (1.803 m)   Wt (!) 149.7 kg   LMP 08/20/2023   SpO2 94%   BMI 46.03 kg/m   Physical Exam Vitals reviewed.  Constitutional:      General: She is not in acute distress.    Appearance: She is not ill-appearing.  HENT:     Head: Normocephalic.  Cardiovascular:     Rate and Rhythm: Regular rhythm. Tachycardia present.     Pulses:          Radial pulses are 2+ on the right side and 2+ on the left side.     Heart sounds: Normal heart sounds. No murmur heard. Pulmonary:     Effort: Tachypnea present.     Breath sounds: Examination of the right-lower field reveals rales. Examination of the left-lower field reveals rales. Rales present.  Abdominal:     General: There is no distension.     Palpations: Abdomen is soft.     Tenderness: There is generalized abdominal tenderness.  Musculoskeletal:     Cervical back: Full passive range of motion without pain and normal range of motion.     Comments: Spontaneous movement of bilateral upper and lower extremities  Neurological:     Mental Status: She is alert.     (all labs ordered are  listed, but only abnormal results are displayed) Labs Reviewed  RESP PANEL BY RT-PCR (RSV, FLU A&B, COVID)  RVPGX2 - Abnormal; Notable for the following components:      Result Value   SARS Coronavirus 2 by RT PCR POSITIVE (*)    All other components within normal limits  CBC WITH DIFFERENTIAL/PLATELET - Abnormal; Notable for the following components:   MCH 25.9 (*)    All other components within normal limits  COMPREHENSIVE METABOLIC PANEL WITH GFR - Abnormal; Notable for the following components:   Creatinine, Ser 1.01 (*)    All other components within normal limits  TSH - Abnormal; Notable for the following components:   TSH 0.255 (*)    All other components within normal limits  CULTURE, BLOOD (ROUTINE X 2)  CULTURE, BLOOD (ROUTINE X 2)  BRAIN NATRIURETIC PEPTIDE  LIPASE, BLOOD  CBG MONITORING, ED  I-STAT CG4 LACTIC ACID, ED  I-STAT CG4 LACTIC ACID, ED  TYPE AND SCREEN  TROPONIN I (HIGH SENSITIVITY)  TROPONIN I (HIGH SENSITIVITY)    EKG: EKG Interpretation Date/Time:  Friday September 12 2023 10:02:06 EDT Ventricular Rate:  108 PR Interval:  156 QRS Duration:  87 QT Interval:  351 QTC Calculation: 471 R Axis:   61  Text Interpretation: Sinus tachycardia Probable anteroseptal infarct, old Confirmed by Neysa Clap [phone]) on 09/12/2023 10:11:00 AM  Radiology: DG Chest Portable 1 View Result Date: 09/12/2023 CLINICAL DATA:  Shortness of breath EXAM: PORTABLE CHEST 1 VIEW COMPARISON:  08/28/2023. FINDINGS: Stable mild cardiomegaly. No overt edema, focal consolidation, sizeable pleural effusion, or pneumothorax. No acute osseous abnormality. IMPRESSION: No acute cardiopulmonary findings.  Stable mild cardiomegaly. Electronically Signed   By: Harrietta Sherry M.D.   On: 09/12/2023 10:51     Procedures   Medications Ordered in the ED  ondansetron  (ZOFRAN ) injection 4 mg (4 mg Intravenous Given 09/12/23 1033)  bumetanide  (BUMEX ) injection 2 mg (2 mg Intravenous Given  09/12/23 1047)  acetaminophen  (TYLENOL ) tablet 1,000 mg (1,000 mg Oral Given 09/12/23 1305)    Clinical Course as of 09/12/23 1443  Fri Sep 12, 2023  1052 SARS Coronavirus  2 by RT PCR(!): POSITIVE [AG]    Clinical Course User Index [AG] Nada Chroman, DO                                 Medical Decision Making Amount and/or Complexity of Data Reviewed Labs:  Decision-making details documented in ED Course. Radiology: ordered.  Risk OTC drugs. Prescription drug management.   On initial evaluation patient is saturating well on room air however placed on nasal cannula for comfort as patient is normally on supplemental oxygen  at baseline.  Based on patient's history and physical examination findings differential diagnosis includes pneumonia, pneumothorax, pulmonary edema, HFpEF exacerbation, acute viral illness, pulmonary embolism.  Will obtain laboratory studies as well as chest x-ray imaging.  Based upon patient's history of HFpEF with bibasilar rales will give home Bumex  as patient has not been able to obtain secondary to inability to tolerate p.o. in the setting of nausea and vomiting.  Laboratory studies without evidence of significant leukocytosis, anemia, electrolyte abnormality, renal impairment and troponin and BNP within normal limits.  Patient is SARS-CoV-2 positive.  At this time do not believe patient's presentation is secondary to pulmonary embolism as patient is not hypoxic and shortness of breath did not improve after Bumex  therefore do not believe CT PE necessary at this time.  Chest x-ray imaging without evidence of pneumonia or pneumothorax.  Less likely ACS/MI as patient EKG is nonischemic, patient denies chest pain and troponins are within normal limits.  Patient was ambulatory without desaturations.  On reevaluation patient still endorses muscle aches however is resting comfortably not in acute distress.  At this time believe patient is safe to be discharged with strict return  precautions and recommendations to follow-up with patient's primary care provider.     Final diagnoses:  COVID    ED Discharge Orders     None      Chroman Nada DO Emergency Medicine PGY2    Nada Chroman, DO 09/12/23 1443    Neysa Caron PARAS, DO 09/12/23 [phone]

## 2023-09-12 NOTE — ED Notes (Signed)
 Per provider, 2nd lactic not needed

## 2023-09-12 NOTE — ED Notes (Signed)
 Patient ambulated on room air around nurses station x 2. Patient states she is not any more short of breath than normal. O2 did not drop below 95% during ambulation.

## 2023-09-12 NOTE — Telephone Encounter (Signed)
 Okay to send in order for nebulizer supplies  Can order a home sleep test

## 2023-09-12 NOTE — Progress Notes (Signed)
 Virtual Visit via Video Note  I connected with Lindsay [name] on 09/12/23 at  8:00 AM EDT by a video enabled telemedicine application and verified that I am speaking with the correct person using two identifiers.  Location: Patient: home Provider: home office   I discussed the limitations of evaluation and management by telemedicine and the availability of in person appointments. The patient expressed understanding and agreed to proceed.   I discussed the assessment and treatment plan with the patient. The patient was provided an opportunity to ask questions and all were answered. The patient agreed with the plan and demonstrated an understanding of the instructions.   The patient was advised to call back or seek an in-person evaluation if the symptoms worsen or if the condition fails to improve as anticipated.  I provided 20 minutes of non-face-to-face time during this encounter.  THERAPIST PROGRESS NOTE  Session Time: 8:00 AM to 8:20 AM  Participation Level: Active  Behavioral Response: CasualAlertAnxious and Dysphoric  Type of Therapy: Individual Therapy  Treatment Goals addressed:  Patient reports therapy helps cope with her stressors continue to work on coping strategies to help improve symptoms of her diagnoses, coping in general  ProgressTowards Goals: Progressing-processed patient's feelings in session assessing patient needs the support strength-based intervention needs to get to the emergency room things helpful to check in with therapist  Interventions: Solution Focused, Strength-based, Supportive, and Other: coping  Summary: Lindsay [name] is a 53 y.o. female who presents with going to go to emergency room, temperature go up 102.8, ache and it is a lot. She was going to call ambulance a couple of hours ago but thought it was too loud and wake everyone up. Therapist related just call. She is going to do that now. Not sure if she wants to wait for the aid. Going to pack a bag  in case they keep her. Therapist encouraged this as thinks need to stay for a minute to figure out what is going on. Therapist offered emotional support, active listening and validation of patient's emotional experience. We did not have long session as patient at this point needs to emergency room.       Suicidal/Homicidal: No  Plan: Return again in 1 week.2.  Provide supportive strength-based intervention patient dealing with medical issues helpful to have outlets to talk about and therapist to be empathetic and supportive  Diagnosis: Major depressive disorder, recurrent, moderate, generalized anxiety disorder, chronic PTSD, panic attacks   Collaboration of Care: Other none needed  Patient/Guardian was advised Release of Information must be obtained prior to any record release in order to collaborate their care with an outside provider. Patient/Guardian was advised if they have not already done so to contact the registration department to sign all necessary forms in order for us  to release information regarding their care.   Consent: Patient/Guardian gives verbal consent for treatment and assignment of benefits for services provided during this visit. Patient/Guardian expressed understanding and agreed to proceed.   Ronal Sink, LCSW 09/12/2023

## 2023-09-15 ENCOUNTER — Ambulatory Visit

## 2023-09-16 ENCOUNTER — Telehealth (INDEPENDENT_AMBULATORY_CARE_PROVIDER_SITE_OTHER): Admitting: Pulmonary Disease

## 2023-09-16 ENCOUNTER — Ambulatory Visit: Admitting: Pulmonary Disease

## 2023-09-16 DIAGNOSIS — J410 Simple chronic bronchitis: Secondary | ICD-10-CM

## 2023-09-16 DIAGNOSIS — U071 COVID-19: Secondary | ICD-10-CM | POA: Diagnosis not present

## 2023-09-16 DIAGNOSIS — G4733 Obstructive sleep apnea (adult) (pediatric): Secondary | ICD-10-CM

## 2023-09-16 DIAGNOSIS — J449 Chronic obstructive pulmonary disease, unspecified: Secondary | ICD-10-CM | POA: Diagnosis not present

## 2023-09-16 DIAGNOSIS — A545 Gonococcal pharyngitis: Secondary | ICD-10-CM

## 2023-09-16 DIAGNOSIS — J441 Chronic obstructive pulmonary disease with (acute) exacerbation: Secondary | ICD-10-CM

## 2023-09-16 DIAGNOSIS — J9611 Chronic respiratory failure with hypoxia: Secondary | ICD-10-CM

## 2023-09-16 MED ORDER — PREDNISONE 20 MG PO TABS: 20.0000 mg | ORAL_TABLET | Freq: Every day | ORAL | 0 refills | Status: DC

## 2023-09-16 MED ORDER — AZITHROMYCIN 250 MG PO TABS
ORAL_TABLET | ORAL | 0 refills | Status: DC
Start: 2023-09-16 — End: 2023-10-10

## 2023-09-16 NOTE — Patient Instructions (Signed)
 Prescription for azithromycin  and prednisone  sent to pharmacy for you  We will schedule you for a sleep study -Sleep study is to be done in the sleep lab for anybody who is already on oxygen  supplementation  DME referral for nebulization machine and supplies  Tentative follow-up in 3 months

## 2023-09-16 NOTE — Progress Notes (Signed)
 Virtual Visit via Video Note  I connected with Lindsay [name] on 09/16/23 at 11:15 AM EDT by a video enabled telemedicine application and verified that I am speaking with the correct person using two identifiers.  Location: Patient: Patient at home Provider: [address]N/A office   I discussed the limitations of evaluation and management by telemedicine and the availability of in person appointments. The patient expressed understanding and agreed to proceed.  History of Present Illness: Recently tested positive for COVID  Cough, shortness of breath, increased oxygen  requirement  Still awaiting nebulizer supplies  We did discuss recent pulmonary function test consistent with chronic obstructive pulmonary disease  Recently been in contact with medical supply company, not able to get CPAP machine needs a new sleep study Has some chest discomfort, with persistent coughing   Observations/Objective:  She looks well, on oxygen  supplementation, did not appear in extremis  Pulmonary function test reviewed showing moderately severe obstruction, probable restriction, moderately severe diffusing capacity, progressive from study from 5 years previous  Assessment and Plan: Chronic obstructive pulmonary disease - Continue bronchodilators - Continue Breztri  - Continue nebulization treatments  Obstructive sleep apnea - Will schedule for a sleep study, split-night study  COVID infection - Prescription for Paxlovid already called into patient  Follow Up Instructions:  Will call in a course of prednisone  and azithromycin  -Encouraged to keep us  updated if she is not feeling any better  Schedule for in-lab sleep study-she is on oxygen  supplementation around-the-clock    I discussed the assessment and treatment plan with the patient. The patient was provided an opportunity to ask questions and all were answered. The patient agreed with the plan and demonstrated an understanding of the  instructions.   The patient was advised to call back or seek an in-person evaluation if the symptoms worsen or if the condition fails to improve as anticipated.  I provided 22 minutes of non-face-to-face time during this encounter.   Jennet DELENA Epley, MD

## 2023-09-17 ENCOUNTER — Other Ambulatory Visit: Payer: Self-pay | Admitting: Physician Assistant

## 2023-09-17 ENCOUNTER — Other Ambulatory Visit: Payer: Self-pay | Admitting: Pulmonary Disease

## 2023-09-17 DIAGNOSIS — J4540 Moderate persistent asthma, uncomplicated: Secondary | ICD-10-CM

## 2023-09-17 DIAGNOSIS — K589 Irritable bowel syndrome without diarrhea: Secondary | ICD-10-CM

## 2023-09-17 LAB — CULTURE, BLOOD (ROUTINE X 2)
Culture: NO GROWTH
Special Requests: ADEQUATE

## 2023-09-18 ENCOUNTER — Ambulatory Visit (INDEPENDENT_AMBULATORY_CARE_PROVIDER_SITE_OTHER): Admitting: Licensed Clinical Social Worker

## 2023-09-18 DIAGNOSIS — F410 Panic disorder [episodic paroxysmal anxiety] without agoraphobia: Secondary | ICD-10-CM | POA: Diagnosis not present

## 2023-09-18 DIAGNOSIS — F4312 Post-traumatic stress disorder, chronic: Secondary | ICD-10-CM | POA: Diagnosis not present

## 2023-09-18 DIAGNOSIS — F331 Major depressive disorder, recurrent, moderate: Secondary | ICD-10-CM

## 2023-09-18 DIAGNOSIS — F411 Generalized anxiety disorder: Secondary | ICD-10-CM | POA: Diagnosis not present

## 2023-09-18 NOTE — Progress Notes (Signed)
 Virtual Visit via Video Note  I connected with Lindsay [name] on 09/18/23 at  9:00 AM EDT by a video enabled telemedicine application and verified that I am speaking with the correct person using two identifiers.  Location: Patient: home Provider: home office   I discussed the limitations of evaluation and management by telemedicine and the availability of in person appointments. The patient expressed understanding and agreed to proceed.   I discussed the assessment and treatment plan with the patient. The patient was provided an opportunity to ask questions and all were answered. The patient agreed with the plan and demonstrated an understanding of the instructions.   The patient was advised to call back or seek an in-person evaluation if the symptoms worsen or if the condition fails to improve as anticipated.  I provided 44 minutes of non-face-to-face time during this encounter.   THERAPIST PROGRESS NOTE  Session Time: 9:00 AM to 9:44 AM  Participation Level: Active  Behavioral Response: CasualAlertAnxious  Type of Therapy: Individual Therapy  Treatment Goals addressed:  Patient reports therapy helps cope with her stressors continue to work on coping strategies to help improve symptoms of her diagnoses, coping in general  ProgressTowards Goals: Progressing-patient has significant medical stressors life stressors assess helpful for patient to have resources of therapy to help her with coping processing thoughts and feelings, providing supportive and interventions knowing what is going on with patient from regular appointments helpful for patient and coping.   Interventions: Solution Focused, Strength-based, Supportive, and Other: coping  Summary: Lindsay [name] is a 53 y.o. female who presents with had COVID, primary doesn't know why didn't keep her at the hospital as she has has major medical diagnosis.  He was mad and therapist said she is frustrated as well they did not keep her.  Gave her meds Zofran , IV two Tylenol , Lasix  IV. PCP called in medications for COVID.  Did get to pulmonary therapist positive about this is this is been a long time waiting for the appointment gave her a steroid and antibody told numbers little worse than last time took pulmonary test.  Want her to get sleep study to get C-pap. She can't get new get supplies for new nebulizer until he writes another order. Went ok can't remember what he said should have recorded it what saying so remember what he said.  Was told bad numbers but not majorly bad not as good as last slightly off.  Doctor explained one of the reasons can't breath that air cut off something collapsing when laying down why needs CPAP. Why air cuts off can't remember the word but something collapse.  Therapist noted this is something that could be positive some explanation that may help when she gets the CPAP.  Patient does not remember everything so is going to write a MyChart for review and ask about the plan therapist encouraged her with that. What do we do from here? He answered but can't remember right after the session. Couldn't understand with accent have to keep asking. He told her why 5 liters at home told can't remember what he said.  Was coughing for an hour last night. Not sleeping cough for an hour it was crazy couldn't get under control. Took different meds and took an hour to get under control 3-4 in the morning happened again like tickle didn't go as long.  Patient talked about not wanting to miss appointments and shares wife so important she describes it as a Engineer, mining.  Therapist can see  a place where she can talk about what is going on and therapist up to date as well that is being helpful. Needs to schedule sleep study canceled appointment for iron  infusion keep the one for Sept 3 week and will have to go to part 2 of iron  infusion. Emergency ortho about MRI for foot. Has to change because has an appointment. Sleep study.  Talking to  [name] has appointments doesn't do things to help.  Thinks she got connected with patient through hospital. Asked her to look into CPAP machine doesn't do anything. Doesn't want to be part of that working nerves. Talking to her when has other appointments.  Back and knee issues , spine doctor same office and pain management in the same office. Orthopedic part of [facility] put shots in knee two months until see someone who else can she see don't want to wait, last time April and didn't feel anything doesn't want to get it as severe as was persistent to get appointment.    Therapist reviewed update of events last time patient going to the emergency room so therapist wanted to know things that happened after we had her session.  Patient said did not keep her primary upset about that and therapist was as well found out she had COVID has all these underlying medical conditions so needed to keep her.  Assess helpful patient to have therapy in relationship with therapist as therapist was frustrated as well therapist being an advocate for patient as well as support and a place patient can process her thoughts and feelings a resource unique as she does not talk to anyone else about issues therapist is involved and updated as we see each other regularly.  Therapist assesses she gets support from this relationship that is helpful for mental health.  Frustrated did not keep her at the hospital thought it was positive primary care was advocating for her.  Therapist impression where she is doing better but she does not feel better talked about difficulties of symptoms with COVID having a cough that would not stop is difficult.  We reviewed she does not remember everything lung doctor said so she is going to do a MyChart problem solve so she will get a review of the assessment and plan noted another positive was the doctor seeming to think a CPAP would help her with her breathing so that was something that may help her as a  positive.  Reviewed as well other open events needs an iron  infusion, needs to go to the orthopedic for an MRI, needs a sleep study, wants to get shots for back and knee.  A lot of things patient has to keep track of a noted she has 3 different MyCharts which is a lot.  Noted the positive [facility] taking over for her pain and how they have been helpful.  The pain management and spine doctor in the same office.  Patient also used session talk about her frustrations with healthcare system think it helps her to get it out helps her with processing emotions being validated and therapist providing support and space for patient to talk about her thoughts and feelings as it is a lot for her to go through managing medical conditions. Suicidal/Homicidal: No  Plan: Return again in 4 weeks.2.  Look at emotional regulation sheets for trauma process thoughts and feelings in session to help with coping  Diagnosis: Major depressive disorder, recurrent, moderate, generalized anxiety disorder, chronic PTSD, panic attacks   Collaboration of  Care: Other none needed  Patient/Guardian was advised Release of Information must be obtained prior to any record release in order to collaborate their care with an outside provider. Patient/Guardian was advised if they have not already done so to contact the registration department to sign all necessary forms in order for us  to release information regarding their care.   Consent: Patient/Guardian gives verbal consent for treatment and assignment of benefits for services provided during this visit. Patient/Guardian expressed understanding and agreed to proceed.   Ronal Sink, LCSW 09/18/2023

## 2023-09-22 ENCOUNTER — Other Ambulatory Visit: Payer: Self-pay

## 2023-09-22 ENCOUNTER — Ambulatory Visit

## 2023-09-22 NOTE — Patient Instructions (Signed)
 Visit Information  Thank you for taking time to visit with me today. Please don't hesitate to contact me if I can be of assistance to you before our next scheduled appointment.  Your next care management appointment is by telephone on Monday, September 8th at 11:00am.    Please call the care guide team at [phone] if you need to cancel, schedule, or reschedule an appointment.   A reminder to ALL patients/family/friends, please call the [place]  National Suicide Prevention Lifeline: 727-530-1602 or TTY: 678 163 1335 TTY 404-082-6758) to talk to a trained counselor if you are experiencing a Mental Health or Behavioral Health Crisis or need someone to talk to.  [name] Stamp BSN, CCM [place]  VBC[facility] Population Health RN Care Manager Direct Dial: [phone]  Fax: [phone]

## 2023-09-22 NOTE — Patient Outreach (Signed)
 Complex Care Management   Visit Note  09/22/2023  Name:  Lindsay [name] MRN: [id number] DOB: April 01, 1970  Situation: Referral received for Complex Care Management related to Heart Failure and COPD I obtained verbal consent from Patient.  Visit completed with Patient  on the phone. Main concern: getting scheduled for sleep study so she can get another CPAP, health insurance is requesting another study before they will supply CPAP.  She was diagnosed with COVID, states she is slightly better but still coughing.  Last check for fever was yesterday: 100.61F.   Background:   Past Medical History:  Diagnosis Date   Acid reflux    Anemia    Iron  Def   Anorexia    CHF (congestive heart failure) (HCC)    Chronic kidney disease    Nephrotic syndrome   Colon polyp 2009   Depression with anxiety    Edema leg    Fibroid tumor 04/2022   Fibromyalgia    Hemorrhoids    Hidradenitis suppurativa    Hypertension    IBS (irritable bowel syndrome)    Low back pain    Migraines    Morbidly obese (HCC)    Neuromuscular disorder (HCC)    fibromyalgia   Neuropathy    Panic attacks    Polyp of vocal cord or larynx    Stroke (HCC)    Tonsil pain     Assessment: Patient Reported Symptoms:  Cognitive Cognitive Status: No symptoms reported, Insightful and able to interpret abstract concepts, Alert and oriented to person, place, and time, Normal speech and language skills      Neurological Neurological Review of Symptoms: Weakness    HEENT HEENT Symptoms Reported: No symptoms reported      Cardiovascular Cardiovascular Symptoms Reported: Fatigue    Respiratory Respiratory Symptoms Reported: Productive cough Other Respiratory Symptoms: Diagnosed with COVID 09/12/23 at ED. She is taking Paxlovid. Has Tessalon  Perles for cough, ran out, couldn't afford refill, drinking tea with honey to relieve cough. On 09/18/23, Pulmonology sent referral for in-lab sleep study so patient can obtain new CPAP.   [employer] won't pay for a new one until she gets an updated sleep study).    Endocrine Endocrine Symptoms Reported: Not assessed    Gastrointestinal Gastrointestinal Symptoms Reported: Not assessed      Genitourinary Genitourinary Symptoms Reported: Not assessed    Integumentary Integumentary Symptoms Reported: Not assessed    Musculoskeletal Musculoskelatal Symptoms Reviewed: Not assessed        Psychosocial Psychosocial Symptoms Reported: Not assessed          09/22/2023    PHQ2-9 Depression Screening   Little interest or pleasure in doing things    Feeling down, depressed, or hopeless    PHQ-2 - Total Score    Trouble falling or staying asleep, or sleeping too much    Feeling tired or having little energy    Poor appetite or overeating     Feeling bad about yourself - or that you are a failure or have let yourself or your family down    Trouble concentrating on things, such as reading the newspaper or watching television    Moving or speaking so slowly that other people could have noticed.  Or the opposite - being so fidgety or restless that you have been moving around a lot more than usual    Thoughts that you would be better off dead, or hurting yourself in some way    PHQ2-9 Total Score    If  you checked off any problems, how difficult have these problems made it for you to do your work, take care of things at home, or get along with other people    Depression Interventions/Treatment      There were no vitals filed for this visit.  Medications Reviewed Today   Medications were not reviewed in this encounter     Recommendation:   Specialty provider follow-up : [name] needs to reschedule iron  infusion, had to cancel due to COVID diagnosis.  Educated on being fever free for 24 hours without fever-reducing meds in order to reschedule appointments.   She will check back with Pulmonary regarding getting scheduled for in-lab sleep study.  Educated on making a wedge in  her bed for added respiratory support when she sleeps until CPAP is obtained.  She is currently using O2 [place]ords as ordered.   Follow Up Plan:   Telephone follow up appointment date/time:  two weeks.  [name] Stamp BSN, CCM [place]  VBC[facility] Population Health RN Care Manager Direct Dial: [phone]  Fax: [phone]

## 2023-09-25 ENCOUNTER — Encounter: Payer: Self-pay | Admitting: Pulmonary Disease

## 2023-09-26 ENCOUNTER — Ambulatory Visit: Payer: Self-pay | Admitting: Pulmonary Disease

## 2023-09-26 ENCOUNTER — Telehealth: Payer: Self-pay

## 2023-09-26 NOTE — Telephone Encounter (Signed)
 This has been sent to Dr. Neda in Mychart message.

## 2023-09-26 NOTE — Telephone Encounter (Signed)
 Dr Neda, please advise on a medication to help pt's cough. See Mychart messages.

## 2023-09-26 NOTE — Telephone Encounter (Signed)
 FYI Only or Action Required?: Action required by provider: patient is continuously cough with recent COVID dx on 8/15. Patient is asking for medication as she is getting no relief.  Patient is followed in Pulmonology for chronic respiratory failure, last seen on 09/16/2023 by Neda Jennet LABOR, MD.  Called Nurse Triage reporting Cough.  Symptoms began 09/10/2023.  Interventions attempted: Rescue inhaler, Maintenance inhaler, Nebulizer treatments, and Home oxygen  use.  Symptoms are: unchanged.  Triage Disposition: See Physician Within 24 Hours-asking for a call back from office. Recommended an acute visit today but no transportation as patient utilizes Medicaid transport. Did recommend UC/ED but patient refused also due to no transportation.    Patient/caregiver understands and will follow disposition?:   Copied from CRM [id number]. Topic: Clinical - Red Word Triage >> Sep 26, 2023  9:44 AM Lavanda D wrote: Red Word that prompted transfer to Nurse Triage: Discolored Mucus/Uncontrollable Coughing/Covid: Diagnosed with Covid on August 15th, she thought it was getting better but it's getting worse again - she said she feels like she's back at square 1. Uncontrollable coughing, yellow mucus, etc. Reason for Disposition  SEVERE coughing spells (e.g., whooping sound after coughing, vomiting after coughing)  Answer Assessment - Initial Assessment Questions 1. ONSET: When did the cough begin?      Started 8/13-was diagnosed on 8/15 with COVID 2. SEVERITY: How bad is the cough today?      9 out of 10 3. SPUTUM: Describe the color of your sputum (e.g., none, dry cough; clear, white, yellow, green)     Yellow mucus 4. HEMOPTYSIS: Are you coughing up any blood? If Yes, ask: How much? (e.g., flecks, streaks, tablespoons, etc.)     no 5. DIFFICULTY BREATHING: Are you having difficulty breathing? If Yes, ask: How bad is it? (e.g., mild, moderate, severe)      Baseline shortness of  breath-doesn't believe it is worse than normal 6. FEVER: Do you have a fever? If Yes, ask: What is your temperature, how was it measured, and when did it start?     No current fever but had a fever of 101 this past Monday 7. CARDIAC HISTORY: Do you have any history of heart disease? (e.g., heart attack, congestive heart failure)      CHF 8. LUNG HISTORY: Do you have any history of lung disease?  (e.g., pulmonary embolus, asthma, emphysema)     Chronic respiratory failure 9. PE RISK FACTORS: Do you have a history of blood clots? (or: recent major surgery, recent prolonged travel, bedridden)     no 10. OTHER SYMPTOMS: Do you have any other symptoms? (e.g., runny nose, wheezing, chest pain)       Runny nose, pain with coughing 12. TRAVEL: Have you traveled out of the country in the last month? (e.g., travel history, exposures)       No  COVID positive on 09/12/2023. Patient is actively coughing on the phone with Nurse triage. States the medication she had been given by PCP wasn't working. Attempted to schedule an acute appointment today but patient needs Medicaid transportation to get her to any appointment. Patient is recommended to UC or ED but patient states she doesn't have any transportation. Encouraged patient to see if someone can take her to the ED for evaluation. Patient is asking for medication for her cough from the office.  Protocols used: Cough - Acute Productive-A-AH

## 2023-09-26 NOTE — Telephone Encounter (Signed)
 Dr. Neda please see nurse triage encounter 8/29

## 2023-10-01 ENCOUNTER — Ambulatory Visit

## 2023-10-06 ENCOUNTER — Telehealth: Payer: Self-pay

## 2023-10-08 ENCOUNTER — Other Ambulatory Visit (HOSPITAL_COMMUNITY): Payer: Self-pay | Admitting: Psychiatry

## 2023-10-08 DIAGNOSIS — F331 Major depressive disorder, recurrent, moderate: Secondary | ICD-10-CM

## 2023-10-09 ENCOUNTER — Telehealth: Payer: Self-pay | Admitting: Physician Assistant

## 2023-10-09 ENCOUNTER — Other Ambulatory Visit: Payer: Self-pay | Admitting: Family Medicine

## 2023-10-09 ENCOUNTER — Ambulatory Visit (INDEPENDENT_AMBULATORY_CARE_PROVIDER_SITE_OTHER)

## 2023-10-09 VITALS — BP 122/81 | HR 92 | Temp 98.2°F | Resp 18 | Ht 71.0 in | Wt 339.2 lb

## 2023-10-09 DIAGNOSIS — E611 Iron deficiency: Secondary | ICD-10-CM | POA: Diagnosis not present

## 2023-10-09 MED ORDER — DIPHENHYDRAMINE HCL 25 MG PO CAPS
25.0000 mg | ORAL_CAPSULE | Freq: Once | ORAL | Status: AC
  Administered 2023-10-09: 25 mg via ORAL
  Filled 2023-10-09: qty 1

## 2023-10-09 MED ORDER — ACETAMINOPHEN 325 MG PO TABS
650.0000 mg | ORAL_TABLET | Freq: Once | ORAL | Status: AC
  Administered 2023-10-09: 325 mg via ORAL
  Filled 2023-10-09: qty 2

## 2023-10-09 MED ORDER — SODIUM CHLORIDE 0.9 % IV SOLN
510.0000 mg | Freq: Once | INTRAVENOUS | Status: AC
  Administered 2023-10-09: 510 mg via INTRAVENOUS
  Filled 2023-10-09: qty 17

## 2023-10-09 NOTE — Patient Instructions (Signed)

## 2023-10-09 NOTE — Telephone Encounter (Signed)
 Inbound call from pt stating that she has noticed that her stomach has been growing everyday and she is now looking like she is pregnant. Patient was advised that our soonest availability was until October. Patient does not want to wait until then to be seen. Patient is now requesting to be advised by the nurse if she is needing to go to the ED. Please advise.

## 2023-10-09 NOTE — Progress Notes (Unsigned)
 10/10/2023 [name] [id number] 07/02/1970  Referring provider: Catalina Bare, MD Primary GI doctor: Dr. Federico  ASSESSMENT AND PLAN:  Complains of worsening DOE x 2 weeks with AB swelling/leg swelling Cough with brown mucus, clearing throat, chills Since cardiac catheterization  Had COVID in August 15 Had tooth abscess with facial swelling with amoxicillin  and clindamycin , had zpak and prednisone  08/19 - Order abdominal ultrasound to evaluate for ascites. - Order liver function tests and INR. - Order chest X-ray for fluid overload, possible COPD exacerbation. - Order BNP. - Order urinalysis with patients symptoms and KUB  - Advised ER visit if dyspnea worsens, or if chest pain, weakness, or fever occur  Anemia being followed by hematology, not a candidate for endoscopy secondary to pulmonary HTN 09/12/2023  HGB 12.0 MCV 82.3 Platelets 387 05/08/2023 Iron  28 Ferritin 48.6 B12 446 Recent Labs    05/23/23 0853 05/24/23 0642 05/25/23 0434 06/25/23 1806 06/26/23 0436 06/26/23 1341 06/26/23 1342 06/27/23 0408 08/28/23 1052 09/12/23 0935  HGB 10.8* 11.4* 10.8* 11.6* 11.0* 11.9* 11.9*  11.9* 10.9* 11.3* 12.0  Continue follow up hematology Continue to monitor pulmonary status Recent COVID, DOE, not a candidate for endoscopy unless severe anemia or overt GI bleeding  Early Cirrhosis versus severe MASLD, no evidence of portal hypertension AST 15 ALT 12 Alkphos 72 TBili 0.5 INR 05/05/2023 1.1  MELD 3.0: 8 at 05/05/2023  8:56 AM MELD-Na: 7 at 05/05/2023  8:56 AM Calculated from: Serum Creatinine: 0.73 mg/dL (Using min of 1 mg/dL) at 05/29/7972  1:43 AM Serum Sodium: 141 mEq/L (Using max of 137 mEq/L) at 05/05/2023  8:56 AM Total Bilirubin: 0.2 mg/dL (Using min of 1 mg/dL) at 05/29/7972  1:43 AM Serum Albumin : 4.5 g/dL (Using max of 3.5 g/dL) at 05/29/7972  1:43 AM INR(ratio): 1.1 ratio at 05/05/2023  8:56 AM Age at listing (hypothetical): 52 years Sex: Female at 05/05/2023  8:56  AM 05/05/23 negative hepatocellular workup  needs hepatitis A and B vaccination. 04/28/2023 RUQ US   nodular contour of the liver suggestive of cirrhosis 08/10/2022 CT AB showed liver unremarkable spleen unremarkable, no portal hypertension Suspected metabolic dysfunction associated seatohepatitis (MASH, formerly NASH)  by history and ultrasound.  Fib4 0.36  advanced fibrosis excluded, INR normal, no HE -Repeat CBC, CMET, INR, AB US  - need LFTs and CBC monitored every 6 months, revaluation every 2-3 years.  -Continue to work on risk factor modification including diet exercise and control of risk factors including blood sugars.  COPD/OSA/pulmonary HTN 06/26/2023 cardiac catheterization ejection fraction 5055% mild pulmonary hypertension On chronic oxygen , has pulmonary HTN See TE 07/09/23, per pulmonary, unable to undergo endoscopy Recent COVID, DOE, not a candidate unless severe anemia or overt GI bleeding  Constipation with history of stool impaction, bloating and AB discomfort Colonoscopy 2020 diverticulosis otherwise normal recall 2027 04/28/2023 KUB moderate large stool burden -continue linzess  290, significantly improved  Reflux with nausea vomiting On Ozempic  once a week, tried Prilosec increased 40 mg Instructed to stop NSAIDs, not on at this time -Provided with gastroparesis diet -Increase prilosec 40 mg BID  Morbid obesity  Body mass index is 46.44 kg/m.  -Patient has been advised to make an attempt to improve diet and exercise patterns to aid in weight loss. -Recommended diet heavy in fruits and veggies and low in animal meats, cheeses, and dairy products, appropriate calorie intake  Patient Care Team: Catalina Bare, MD as PCP - General (Internal Medicine) Levern Hutching, MD as PCP - Cardiology (Cardiology) Slusher, [name]  A, RN as Registered Nurse  HISTORY OF PRESENT ILLNESS: 53 y.o. female with a past medical history listed below presents for follow up of  imaging.  Patient last seen in the office 05/08/2023 by myself for cirrhosis versus severe MASLD, constipation and reflux.  Discussed the use of AI scribe software for clinical note transcription with the patient, who gave verbal consent to proceed.  History of Present Illness   Asmara Backs is a 53 year old female with pulmonary hypertension and fatty liver disease who presents with abdominal swelling and shortness of breath.  She has experienced significant abdominal swelling and shortness of breath over the past month, with symptoms worsening in the last two weeks. Her abdomen has been enlarging, which is concerning as it has never been this way before, even when she weighed 415 pounds. Shortness of breath has been present since her cardiac catheterization in May, which revealed pulmonary hypertension.  In August, she was diagnosed with COVID-19, which coincided with increased shortness of breath and an episode of melena. She also had a sore right side of her face due to a dental abscess, treated with clindamycin  and amoxicillin . She experiences chills and sweating at night, similar to when she had COVID-19, and is concerned about whether the infection has fully resolved.  She denies current diarrhea, constipation, or hematochezia, noting regular bowel movements since starting Linzess . She is on chronic oxygen  therapy and uses inhalers for her pulmonary condition. Her medications include omeprazole  twice daily, Ozempic , and torsemide  once daily, previously taken twice daily.  She reports occasional dysuria, attributing it to irritation from antibiotics. She mentions mild leg swelling, with right abdominal and flank pain. No significant bloating, gas, or urinary issues beyond the burning sensation.        She  reports that she quit smoking about 4 years ago. Her smoking use included cigarettes. She started smoking about 29 years ago. She has a 2.5 pack-year smoking history. She has never used  smokeless tobacco. She reports that she does not currently use alcohol. She reports that she does not currently use drugs after having used the following drugs: Marijuana. Frequency: 3.00 times per week.  RELEVANT GI HISTORY, IMAGING AND LABS: Results   DIAGNOSTIC Cardiac catheterization: Pulmonary hypertension (05/2023)      Review of pertinent gastrointestinal problems: 1. Adenomatous polyps in her colon;   colonoscopy in 07/2007 with one polyp that was removed and was a hyperplastic polyp; also had hemorrhoids.  Colonoscopy  04/2015 Dr. Teressa (minor rectal bleeding); this was an incomplete colonoscopy 2 to BMI 50, I was only able to get to the ascending colon; 4 polyps were removed, 3 of them were adenomatous.  My office arranged barium enema but she never had that done. Colonoscopy 12/31/2018 with Dr. Teressa good bowel prep diverticulosis left colon no polyps or cancers recall 7 years 2.  Dyspepsia led to upper endoscopy May 2012.  Minor gastritis was noted, biopsies showed no sign of H. Pylori. Ultrasound June 2012 was normal.   HIDA scan June 2012 was also normal.   CT scan August 2012 was essentially normal except for prominent stool   08/10/2022 CT abdomen pelvis with contrast for abdominal pain shows mild diverticulosis without diverticulitis Initial CT and pelvis with right lower quadrant abdominal pain showed slightly enlarged appendix without any acute inflammation, suggested repeat 12 to 24 hours 08/10/2022 repeat CT abdomen pelvis with contrast for right lower quadrant abdominal pain showed prominent stool throughout the colon favors constipation, normal appendix,  suspected 2.1 cm right uterine fibroid and aortic atherosclerosis CBC    Component Value Date/Time   WBC 6.1 09/12/2023 0935   RBC 4.64 09/12/2023 0935   HGB 12.0 09/12/2023 0935   HGB 11.4 08/07/2022 1024   HCT 38.2 09/12/2023 0935   HCT 34.5 08/07/2022 1024   PLT 387 09/12/2023 0935   PLT 421 08/07/2022 1024    MCV 82.3 09/12/2023 0935   MCV 83 08/07/2022 1024   MCH 25.9 (L) 09/12/2023 0935   MCHC 31.4 09/12/2023 0935   RDW 15.1 09/12/2023 0935   RDW 14.3 08/07/2022 1024   LYMPHSABS 1.4 09/12/2023 0935   LYMPHSABS 2.0 08/07/2022 1024   MONOABS 1.0 09/12/2023 0935   EOSABS 0.0 09/12/2023 0935   EOSABS 0.2 08/07/2022 1024   BASOSABS 0.0 09/12/2023 0935   BASOSABS 0.0 08/07/2022 1024   Recent Labs    05/23/23 0853 05/24/23 0642 05/25/23 0434 06/25/23 1806 06/26/23 0436 06/26/23 1341 06/26/23 1342 06/27/23 0408 08/28/23 1052 09/12/23 0935  HGB 10.8* 11.4* 10.8* 11.6* 11.0* 11.9* 11.9*  11.9* 10.9* 11.3* 12.0    CMP     Component Value Date/Time   NA 137 09/12/2023 0935   NA 142 08/07/2022 1024   K 3.6 09/12/2023 0935   CL 104 09/12/2023 0935   CO2 22 09/12/2023 0935   GLUCOSE 93 09/12/2023 0935   BUN 7 09/12/2023 0935   BUN 12 08/07/2022 1024   CREATININE 1.01 (H) 09/12/2023 0935   CREATININE 0.88 09/15/2013 1032   CALCIUM  9.2 09/12/2023 0935   PROT 7.3 09/12/2023 0935   PROT 6.7 08/07/2022 1024   ALBUMIN  3.7 09/12/2023 0935   ALBUMIN  4.3 08/07/2022 1024   AST 15 09/12/2023 0935   ALT 12 09/12/2023 0935   ALKPHOS 72 09/12/2023 0935   BILITOT 0.5 09/12/2023 0935   BILITOT <0.2 08/07/2022 1024   GFRNONAA >60 09/12/2023 0935   GFRAA 67 02/02/2020 0933      Latest Ref Rng & Units 09/12/2023    9:35 AM 08/28/2023   10:52 AM 06/25/2023    6:06 PM  Hepatic Function  Total Protein 6.5 - 8.1 g/dL 7.3  6.6  7.0   Albumin  3.5 - 5.0 g/dL 3.7  3.3  3.5   AST 15 - 41 U/L 15  12  11    ALT 0 - 44 U/L 12  11  13    Alk Phosphatase 38 - 126 U/L 72  63  74   Total Bilirubin 0.0 - 1.2 mg/dL 0.5  0.2  0.5       Current Medications:   Current Outpatient Medications (Endocrine & Metabolic):    Semaglutide , 2 MG/DOSE, (OZEMPIC , 2 MG/DOSE,) 8 MG/3ML SOPN, INJECT 2 MG INTO THE SKIN ONCE A WEEK.  Current Outpatient Medications (Cardiovascular):    atorvastatin  (LIPITOR) 40 MG  tablet, TAKE 1 TABLET BY MOUTH EVERY DAY   diltiazem  (CARDIZEM  CD) 120 MG 24 hr capsule, Take 1 capsule (120 mg total) by mouth daily.   torsemide  (DEMADEX ) 20 MG tablet, TAKE 1 TABLET BY MOUTH TWICE A DAY  Current Outpatient Medications (Respiratory):    albuterol  (PROVENTIL ) (2.5 MG/3ML) 0.083% nebulizer solution, INHALE 3 ML BY NEBULIZATION EVERY 6 HOURS AS NEEDED FOR WHEEZING OR SHORTNESS OF BREATH   albuterol  (VENTOLIN  HFA) 108 (90 Base) MCG/ACT inhaler, 2 PUFFS INHALED EVERY 6 HOURS AS NEEDED FOR 30 DAYS   budesonide -glycopyrrolate-formoterol  (BREZTRI  AEROSPHERE) 160-9-4.8 MCG/ACT AERO inhaler, Inhale 2 puffs into the lungs in the morning and at bedtime.  fluticasone  (FLONASE ) 50 MCG/ACT nasal spray, SPRAY 1 SPRAY INTO EACH NOSTRIL DAILY (Patient taking differently: Place 1 spray into both nostrils daily as needed for allergies.)   ipratropium-albuterol  (DUONEB) 0.5-2.5 (3) MG/3ML SOLN, Take 3 mLs by nebulization 2 (two) times daily.   promethazine  (PHENERGAN ) 25 MG tablet, TAKE 1 TABLET BY MOUTH EVERY 6 HOURS AS NEEDED FOR NAUSEA OR VOMITING.  Current Outpatient Medications (Analgesics):    acetaminophen  (TYLENOL ) 500 MG tablet, Take 1 tablet (500 mg total) by mouth every 6 (six) hours as needed. (Patient taking differently: Take 500 mg by mouth every 6 (six) hours as needed for mild pain (pain score 1-3).)   oxyCODONE -acetaminophen  (PERCOCET/ROXICET) 5-325 MG tablet, Take by mouth.   SUMAtriptan  (IMITREX ) 25 MG tablet, Take 1 tablet (25 mg total) by mouth once for 1 dose. May repeat in 2 hours if headache persists or recurs.   Current Outpatient Medications (Other):    OXYGEN , Inhale into the lungs. 5 LITERS AT HOME, 3 LITERS WHEN OUTSIDE HOME   ACCU-CHEK GUIDE test strip, USE ONE TEST STRIP TO CHECK BLOOD SUGARS ONCE A DAY AS NEEDED   Accu-Chek Softclix Lancets lancets, USE ONE LANCET TO CHECK BLOOD SUGAR ONE A DAY AS NEEDED   Brexpiprazole  (REXULTI ) 3 MG TABS, Take 1 tablet (3 mg  total) by mouth daily.   dicyclomine  (BENTYL ) 10 MG capsule, TAKE 1 CAPSULE (10 MG TOTAL) BY MOUTH 3 (THREE) TIMES DAILY AS NEEDED FOR SPASMS (AB PAIN, DIARRHEA).   doxepin  (SINEQUAN ) 25 MG capsule, Take 1 capsule (25 mg total) by mouth at bedtime.   fluconazole  (DIFLUCAN ) 150 MG tablet, Take 150 mg by mouth 2 (two) times daily.   hydrocortisone  (ANUSOL -HC) 2.5 % rectal cream, PLACE 1 APPLICATION RECTALLY 2 (TWO) TIMES DAILY.   lamoTRIgine  (LAMICTAL ) 150 MG tablet, Take 1/2 tab twice  daily   LINZESS  290 MCG CAPS capsule, TAKE 1 CAPSULE BY MOUTH DAILY BEFORE BREAKFAST.   meclizine  (ANTIVERT ) 25 MG tablet, Take 25 mg by mouth 3 (three) times daily.   omeprazole  (PRILOSEC) 40 MG capsule, TAKE 1 CAPSULE (40 MG TOTAL) BY MOUTH DAILY.   ondansetron  (ZOFRAN -ODT) 8 MG disintegrating tablet, Take 1 tablet (8 mg total) by mouth every 8 (eight) hours as needed.   Safety Seal Miscellaneous MISC, Apply 1 Application topically in the morning. Medication Name; AA Gel   tiZANidine  (ZANAFLEX ) 4 MG tablet, Take 4 mg by mouth every 8 (eight) hours as needed for muscle spasms.  Medical History:  Past Medical History:  Diagnosis Date   Acid reflux    Anemia    Iron  Def   Anorexia    CHF (congestive heart failure) (HCC)    Chronic kidney disease    Nephrotic syndrome   Colon polyp 2009   Depression with anxiety    Edema leg    Fibroid tumor 04/2022   Fibromyalgia    Hemorrhoids    Hidradenitis suppurativa    Hypertension    IBS (irritable bowel syndrome)    Low back pain    Migraines    Morbidly obese (HCC)    Neuromuscular disorder (HCC)    fibromyalgia   Neuropathy    Panic attacks    Polyp of vocal cord or larynx    Stroke (HCC)    Tonsil pain    Allergies:  Allergies  Allergen Reactions   Lisinopril  Rash and Cough     Surgical History:  She  has a past surgical history that includes Hemorrhoid surgery; Upper gastrointestinal  endoscopy; Axillary hidradenitis excision; Inguinal  hidradenitis excision; Tonsillectomy (10/18/2010); Colonoscopy with propofol  (N/A, 05/25/2015); Colonoscopy with propofol  (N/A, 12/31/2018); and RIGHT/LEFT HEART CATH AND CORONARY ANGIOGRAPHY (N/A, 06/26/2023). Family History:  Her family history includes Anxiety disorder in her mother and sister; Cancer in her father; Depression in her mother, sister, and sister; Diabetes in her mother; Heart disease in her father and mother; High blood pressure in her mother; Kidney failure in her mother; Learning disabilities in her sister.  REVIEW OF SYSTEMS  : All other systems reviewed and negative except where noted in the History of Present Illness.  PHYSICAL EXAM: BP 130/72   Pulse (!) 101   Ht 5' 11 (1.803 m)   Wt (!) 333 lb (151 kg)   BMI 46.44 kg/m  Physical Exam   GENERAL APPEARANCE: Well nourished, in no apparent distress. HEENT: No cervical lymphadenopathy, unremarkable thyroid , sclerae anicteric, conjunctiva pink. RESPIRATORY: Respiratory effort normal, breath sounds equal bilaterally without rales, rhonchi, or wheezing. CARDIO: Regular rhythm with occasional extra beat, no murmurs, rubs, or gallops, peripheral pulses intact. ABDOMEN: Soft, non-distended, decreased bowel sounds, no tenderness to palpation, no rebound, no mass appreciated. RECTAL: Declines. MUSCULOSKELETAL: Full range of motion, normal gait, without edema. SKIN: Dry, intact without rashes or lesions. No jaundice. NEURO: Alert, oriented, no focal deficits. PSYCH: Cooperative, normal mood and affect. EXTREMITIES: Mild edema in legs.      Alan JONELLE Coombs, PA-C 10:27 AM

## 2023-10-09 NOTE — Progress Notes (Signed)
 Diagnosis: Iron  Deficiency Anemia  Provider:  Praveen Mannam MD  Procedure: IV Infusion  IV Type: Peripheral, IV Location: L Forearm  Actemra (Tocilizumab), Feraheme  (Ferumoxytol ), Dose: 510 mg  Infusion Start Time: 1002  Infusion Stop Time: 1019  Post Infusion IV Care: Observation period completed and Peripheral IV Discontinued  Discharge: Condition: Good, Destination: Home . AVS Provided  Performed by:  Adem Costlow, RN

## 2023-10-09 NOTE — Telephone Encounter (Signed)
 Called and spoke with patient. Patient needs in office assessment for possible ascites. Patient states that she has not consumed alcohol in 4 years. Patient is taking Torsemide  for CHF. Patient has been scheduled for an appt with Alan Coombs, PA tomorrow at 9:20 am.  PJ, CMA notified of add on.

## 2023-10-10 ENCOUNTER — Encounter: Payer: Self-pay | Admitting: Physician Assistant

## 2023-10-10 ENCOUNTER — Ambulatory Visit
Admission: RE | Admit: 2023-10-10 | Discharge: 2023-10-10 | Disposition: A | Discharge status: Home / Self Care | Source: Ambulatory Visit | Attending: Physician Assistant | Admitting: Physician Assistant

## 2023-10-10 ENCOUNTER — Ambulatory Visit: Payer: Self-pay | Admitting: Physician Assistant

## 2023-10-10 ENCOUNTER — Other Ambulatory Visit (INDEPENDENT_AMBULATORY_CARE_PROVIDER_SITE_OTHER)

## 2023-10-10 ENCOUNTER — Ambulatory Visit (INDEPENDENT_AMBULATORY_CARE_PROVIDER_SITE_OTHER): Admitting: Physician Assistant

## 2023-10-10 VITALS — BP 130/72 | HR 101 | Ht 71.0 in | Wt 333.0 lb

## 2023-10-10 DIAGNOSIS — K7460 Unspecified cirrhosis of liver: Secondary | ICD-10-CM

## 2023-10-10 DIAGNOSIS — K5900 Constipation, unspecified: Secondary | ICD-10-CM | POA: Diagnosis not present

## 2023-10-10 DIAGNOSIS — K589 Irritable bowel syndrome without diarrhea: Secondary | ICD-10-CM

## 2023-10-10 DIAGNOSIS — G4733 Obstructive sleep apnea (adult) (pediatric): Secondary | ICD-10-CM

## 2023-10-10 DIAGNOSIS — Z6841 Body Mass Index (BMI) 40.0 and over, adult: Secondary | ICD-10-CM

## 2023-10-10 DIAGNOSIS — R109 Unspecified abdominal pain: Secondary | ICD-10-CM

## 2023-10-10 DIAGNOSIS — D509 Iron deficiency anemia, unspecified: Secondary | ICD-10-CM

## 2023-10-10 DIAGNOSIS — E538 Deficiency of other specified B group vitamins: Secondary | ICD-10-CM

## 2023-10-10 DIAGNOSIS — E669 Obesity, unspecified: Secondary | ICD-10-CM

## 2023-10-10 DIAGNOSIS — K2970 Gastritis, unspecified, without bleeding: Secondary | ICD-10-CM | POA: Diagnosis not present

## 2023-10-10 DIAGNOSIS — R601 Generalized edema: Secondary | ICD-10-CM

## 2023-10-10 DIAGNOSIS — I272 Pulmonary hypertension, unspecified: Secondary | ICD-10-CM

## 2023-10-10 DIAGNOSIS — K5904 Chronic idiopathic constipation: Secondary | ICD-10-CM

## 2023-10-10 DIAGNOSIS — R1900 Intra-abdominal and pelvic swelling, mass and lump, unspecified site: Secondary | ICD-10-CM

## 2023-10-10 DIAGNOSIS — R0609 Other forms of dyspnea: Secondary | ICD-10-CM

## 2023-10-10 DIAGNOSIS — Z860100 Personal history of colon polyps, unspecified: Secondary | ICD-10-CM

## 2023-10-10 DIAGNOSIS — J449 Chronic obstructive pulmonary disease, unspecified: Secondary | ICD-10-CM

## 2023-10-10 DIAGNOSIS — R112 Nausea with vomiting, unspecified: Secondary | ICD-10-CM

## 2023-10-10 DIAGNOSIS — K219 Gastro-esophageal reflux disease without esophagitis: Secondary | ICD-10-CM

## 2023-10-10 DIAGNOSIS — Z87442 Personal history of urinary calculi: Secondary | ICD-10-CM

## 2023-10-10 DIAGNOSIS — R0602 Shortness of breath: Secondary | ICD-10-CM

## 2023-10-10 LAB — CBC WITH DIFFERENTIAL/PLATELET
Basophils Absolute: 0.1 K/uL (ref 0.0–0.1)
Basophils Relative: 0.9 % (ref 0.0–3.0)
Eosinophils Absolute: 0.2 K/uL (ref 0.0–0.7)
Eosinophils Relative: 1.7 % (ref 0.0–5.0)
HCT: 39.8 % (ref 36.0–46.0)
Hemoglobin: 12.9 g/dL (ref 12.0–15.0)
Lymphocytes Relative: 25.1 % (ref 12.0–46.0)
Lymphs Abs: 2.5 K/uL (ref 0.7–4.0)
MCHC: 32.5 g/dL (ref 30.0–36.0)
MCV: 79.1 fl (ref 78.0–100.0)
Monocytes Absolute: 0.9 K/uL (ref 0.1–1.0)
Monocytes Relative: 9.3 % (ref 3.0–12.0)
Neutro Abs: 6.3 K/uL (ref 1.4–7.7)
Neutrophils Relative %: 63 % (ref 43.0–77.0)
Platelets: 402 K/uL — ABNORMAL HIGH (ref 150.0–400.0)
RBC: 5.02 Mil/uL (ref 3.87–5.11)
RDW: 15.5 % (ref 11.5–15.5)
WBC: 10 K/uL (ref 4.0–10.5)

## 2023-10-10 LAB — COMPREHENSIVE METABOLIC PANEL WITH GFR
ALT: 8 U/L (ref 0–35)
AST: 10 U/L (ref 0–37)
Albumin: 4.7 g/dL (ref 3.5–5.2)
Alkaline Phosphatase: 81 U/L (ref 39–117)
BUN: 15 mg/dL (ref 6–23)
CO2: 28 meq/L (ref 19–32)
Calcium: 9.8 mg/dL (ref 8.4–10.5)
Chloride: 99 meq/L (ref 96–112)
Creatinine, Ser: 1.61 mg/dL — ABNORMAL HIGH (ref 0.40–1.20)
GFR: 36.25 mL/min — ABNORMAL LOW (ref >60.00)
Glucose, Bld: 117 mg/dL — ABNORMAL HIGH (ref 70–99)
Potassium: 4.4 meq/L (ref 3.5–5.1)
Sodium: 140 meq/L (ref 135–145)
Total Bilirubin: 0.4 mg/dL (ref 0.2–1.2)
Total Protein: 8.4 g/dL — ABNORMAL HIGH (ref 6.0–8.3)

## 2023-10-10 LAB — PROTIME-INR
INR: 1.2 ratio — ABNORMAL HIGH (ref 0.8–1.0)
Prothrombin Time: 12.8 s (ref 9.6–13.1)

## 2023-10-10 NOTE — Patient Instructions (Signed)
 Your provider has requested that you go to the basement level for lab work before leaving today. Press B on the elevator. The lab is located at the first door on the left as you exit the elevator.  Your provider has requested that you have an abdominal x ray before leaving today. Please go to the basement floor to our Radiology department for the test.  Advised to go to the ER if there is any severe weakness, severe abdominal pain, vomit blood, dark red blood in your bowel movement, shortness of breath or chest pain.   Follow up pulmonary  You have been scheduled for an abdominal ultrasound at [facility] (1st floor of hospital) on 10/20/23 at 9:30 am. Please arrive 30 minutes prior to your appointment for registration. Make certain not to have anything to eat or drink 8 hours prior to your appointment. Should you need to reschedule your appointment, please contact radiology at [phone]. This test typically takes about 30 minutes to perform.  Gastroparesis Gastroparesis is a condition in which food takes longer than normal to empty from the stomach.  This condition is also known as delayed gastric emptying. It is usually a long-term (chronic) condition.  What are the signs or symptoms? Symptoms of this condition include: Feeling full after eating very little or a loss of appetite. Nausea, vomiting, or heartburn. Bloating of your abdomen. Inconsistent blood sugar (glucose) levels on blood tests. Unexplained weight loss. Acid from the stomach coming up into the esophagus (gastroesophageal reflux). Sudden tightening (spasm) of the stomach, which can be painful. Symptoms may come and go. Some people may not notice any symptoms.  What increases the risk? You are more likely to develop this condition if: You have certain disorders or diseases. These may include: An endocrine disorder. An eating disorder. Amyloidosis. Scleroderma. Parkinson's disease. Multiple  sclerosis. Cancer or infection of the stomach or the vagus nerve. You have had surgery on your stomach or vagus nerve. You take certain medicines. You are female.  Things you can do: Please do small frequent meals like 4-6 meals a day.  Eat and drink liquids at separate times.  Avoid high fiber foods, cook your vegetables, avoid high fat food.  Suggest spreading protein throughout the day (greek yogurt, glucerna, soft meat, milk, eggs) Choose soft foods that you can mash with a fork When you are more symptomatic, change to pureed foods foods and liquids.  Consider reading Living well with Gastroparesis by [facility] Check out this link to a diet online [email or web address]

## 2023-10-11 LAB — PRO B NATRIURETIC PEPTIDE: NT-Pro BNP: <36 pg/mL (ref 0–249)

## 2023-10-12 NOTE — Progress Notes (Signed)
 I agree with the assessment and plan as outlined by Ms. Craig. With possible cirrhosis, would suggest HCC screening every 6 months. Agree with upcoming abdominal ultrasound.

## 2023-10-14 ENCOUNTER — Telehealth: Payer: Self-pay | Admitting: *Deleted

## 2023-10-14 NOTE — Telephone Encounter (Signed)
  Dr.O there is an unanswered nurse triage message from 09/26/23 re: cough. Please advise.  T, CMA    09/26/23 10:00 AM Note Dr [name], please advise on a medication to help pt's cough. See Mychart messages.      [name] to P [facility] (supporting Lindsay DELENA Neda, MD)     09/26/23  9:40 AM Dr. MALVA,      Yesterday Dr. Fidela Lares was trying to find a cough syrup paid for by insurance. Tussin Dm and chest and congestion I have had the [facility] brand. I told him it was over the counter. I have also had mucinex  which he wanted me to try . I had that last weekend.  He asked If I knew what to take for cough that Insurance will pay for . I explained I wasn't aware of what to take. He then re directs me to contact you. I received this call from him after 5 pm yesterday.  Can you please give me something for cough that isn't over the counter. I borrowed money to purchase tesslon perles the over a week ago. It worked for at least 2 days then I was back at square one.  [employer] call in a refill of this.  Couldn't afford it. The insurance company needs a letter of authorization.  And that it's for covid diagnostic not just for cough. Smh i have been waiting for over a week for [employer] ( Lindsay [name] ) to complete the authorization.  No one has done that. He keeps pushing me to contact you I'm getting neglected at this point by [employer] o feel.  I was diagnosed on the 15th of August with Covid and still dealing with the symptoms.  Patient also wanted to know about sleep study: Sleep study scheduled for 12/02/23.

## 2023-10-16 ENCOUNTER — Other Ambulatory Visit: Payer: Self-pay | Admitting: Pulmonary Disease

## 2023-10-16 ENCOUNTER — Ambulatory Visit

## 2023-10-16 MED ORDER — GUAIFENESIN-DM 100-10 MG/5ML PO SYRP: 10.0000 mL | ORAL_SOLUTION | ORAL | 0 refills | Status: DC | PRN

## 2023-10-16 NOTE — Telephone Encounter (Signed)
 Addressed.

## 2023-10-17 ENCOUNTER — Ambulatory Visit (HOSPITAL_COMMUNITY): Admitting: Licensed Clinical Social Worker

## 2023-10-17 DIAGNOSIS — F331 Major depressive disorder, recurrent, moderate: Secondary | ICD-10-CM

## 2023-10-17 DIAGNOSIS — F410 Panic disorder [episodic paroxysmal anxiety] without agoraphobia: Secondary | ICD-10-CM | POA: Diagnosis not present

## 2023-10-17 DIAGNOSIS — F411 Generalized anxiety disorder: Secondary | ICD-10-CM

## 2023-10-17 DIAGNOSIS — F4312 Post-traumatic stress disorder, chronic: Secondary | ICD-10-CM | POA: Diagnosis not present

## 2023-10-17 NOTE — Progress Notes (Addendum)
 Call 911 or report locally for any urgent concerns or suicidal toughtsVirtual Visit via Video Note  I connected with Lindsay [name] on 10/17/23 at 11:00 AM EDT by a video enabled telemedicine application and verified that I am speaking with the correct person using two identifiers.  Location: Patient: home Provider: home office   I discussed the limitations of evaluation and management by telemedicine and the availability of in person appointments. The patient expressed understanding and agreed to proceed.   I discussed the assessment and treatment plan with the patient. The patient was provided an opportunity to ask questions and all were answered. The patient agreed with the plan and demonstrated an understanding of the instructions.   The patient was advised to call back or seek an in-person evaluation if the symptoms worsen or if the condition fails to improve as anticipated.  I provided 45 minutes of non-face-to-face time during this encounter.  THERAPIST PROGRESS NOTE  Session Time: 11:00 AM to 11:45 AM  Participation Level: Active  Behavioral Response: CasualAlertAnxious and appropriate in sessions assess helps patient expressing frustrations to help regulate emotions  Type of Therapy: Individual Therapy  Treatment Goals addressed: Patient reports therapy helps cope with her stressors continue to work on coping strategies to help improve symptoms of her diagnoses, coping in general  ProgressTowards Goals: Progressing-assessed helps patient with coping medical stressors and navigating systems very frustrating therapist validating as well as helping patient cope by processing frustrations in session by expressing thoughts feelings  Interventions: Solution Focused, Strength-based, Supportive, and Other: coping  Summary: Lindsay [name] is a 53 y.o. female who presents with not going up on oxygen  because higher dosage not helping concern what it is doing to her body. They just set up  an appointment sleep study on Nov 4 whatever is collapsing cut off airways really bad yesterday and a couple of times when hadn't talked to therapist bad. From August 19 to last week when made appointment pissed her off crazy. Therapist noted she need a priority contact them and let them know. Pulmonary doctor said need to address sleep apnea.  Cough still lingering PCP and pulmonologist over with them the pulmonologist prescribed Robitussin DM an over the counter med patient says if had the money would pay for it.  She needs new prescription nebulizer doesn't work right and prescription for a new mask not working right mashing her face in can't move or can't breath. What gave her not adult masks lady said ordered adult patient said not adult. That was the supplier patient was talking to.  Told them feels like being neglected.  Therapist was positive she spoke up like that. Sick of everybody, woman [name] tag along supposed to be Economist not doing anything. [name] promoted left in hands of [name] not helping at all. Comes from [name] don't know position don't know what doing. See how sleep study will be and if CPAP will help her. Collapsing sitting up and still doing it. Breathing is still difficult, not good at all. Past 21/2 3 weeks stomach got bigger as if pregnant never been that big before had a GI appointment. Went there to see why is this happening? She tested fluid on lungs and patient says need to test why stomach like this. Asked her if she was dying doctor look at her and patient had to hold herself back from cursing out. She will have ultrasound Monday coughing so bad. Supposed to have colonoscopy and endoscopy couldn't be put to  sleep. Stomach pushing up without moving pushing lungs up compromising how breath maybe can address so stomach down so can breath. Was at 415 lbs now 339 lbs not the weight determined that it is. Patient said maybe go to another system maybe they would know  what it is going on.  Want people she says like therapist that listen and work on solving the problem back injections the other day. Pain management and spine specialist at [facility]. Orthopedic with [facility] on 30th finally shots in knee in August felt the pian not going to have it cleaned out healing process not going to nursing home not doing that at all. Emergency ortho didn't take MRI sick so didn't go coughing so bad sent message Sept 2 didn't get back with her until last Friday. MRI and go back to him on 11th. Didn't address until last Friday will go forward with them. Patient says everyone is overbooked next week appointments all week. The worst is the breathing. See what is wrong with stomach so can breath. Next week has a lot of appointments dermatologist, ultrasound, pain doctor.  Started August 15 with COVID feels like spiraling out. Overwhelming feels she has to talk to therapist communicate and listen to her not bullshit.  At the end of session patient said things she is anxious about cannot talk about because she is in the kitchen. She was able to say worried about mom and dementia therapist guided patient go to primary care get a memory test probably refer to a specialist and that currently there are meds that help stop the progression not sure how well they work but they are out there and encouraged patient to look at that online and she said she will.     As patient herself notes most effective thing for therapy is someone who listens and helps her problem solve.  Frustrations with doctors not listening not getting appointments when she needs them as therapist notes she is a priority that should be noted given her medical issues to get her in faster.  Looked at strategy patient going to look at sleep apnea may help with breathing additionally going to have ultrasound to see what is going on with her stomach that would help her breathing as well.  Therapist validating additionally patient able to  process thoughts and feelings therapy provides emotional release as a resource for supportive interventions and strength-based interventions.  Clarity and insight as well to have her navigate her stressors.  Positive the patient speaks up for herself needed in this situation.  Therapist showed motivational video on anxiety noting some anchors help with anxiety when we get to the core beliefs underneath different core belief drive anxiety showed patient 1 of uncertainty and underestimating her ability to handle them by saying what ever happens today I can handle it as being a powerful statement for coping.  Referred patient to more videos by Michalene Simpers they can help with coping.  Assessed frustration from navigating medical systems and patient says without therapy she will spiral out helpful her for her to express her frustrations and get validation from therapist. Suicidal/Homicidal: No  Plan: Return again in 1 week.2.Look at emotional regulation sheets for trauma process thoughts and feelings in session to help with coping  Diagnosis: Major depressive disorder, recurrent, moderate, generalized anxiety disorder, chronic PTSD, panic attacks  Collaboration of Care: Other none needed  Patient/Guardian was advised Release of Information must be obtained prior to any record release in order to collaborate  their care with an outside provider. Patient/Guardian was advised if they have not already done so to contact the registration department to sign all necessary forms in order for us  to release information regarding their care.   Consent: Patient/Guardian gives verbal consent for treatment and assignment of benefits for services provided during this visit. Patient/Guardian expressed understanding and agreed to proceed.   Ronal Sink, LCSW 10/17/2023

## 2023-10-20 ENCOUNTER — Encounter: Payer: Self-pay | Admitting: Dermatology

## 2023-10-20 ENCOUNTER — Ambulatory Visit (HOSPITAL_COMMUNITY)

## 2023-10-21 ENCOUNTER — Ambulatory Visit: Admitting: Dermatology

## 2023-10-21 NOTE — Telephone Encounter (Signed)
 I see order for nebulizer, supplies and mask sent to [facility] last month. Patient is still awaiting delivery. She says there is a problem with order. Can you check status please?

## 2023-10-21 NOTE — Telephone Encounter (Signed)
 Spoke to [place] with [employer]. She is reaching back out to the patient to make sure everything is ok with her order. NFN

## 2023-10-21 NOTE — Telephone Encounter (Signed)
 Left a message with Avelina from [employer] to check the status of this order.

## 2023-10-24 ENCOUNTER — Ambulatory Visit (INDEPENDENT_AMBULATORY_CARE_PROVIDER_SITE_OTHER): Admitting: Licensed Clinical Social Worker

## 2023-10-24 DIAGNOSIS — F331 Major depressive disorder, recurrent, moderate: Secondary | ICD-10-CM | POA: Diagnosis not present

## 2023-10-24 DIAGNOSIS — F410 Panic disorder [episodic paroxysmal anxiety] without agoraphobia: Secondary | ICD-10-CM | POA: Diagnosis not present

## 2023-10-24 DIAGNOSIS — F4312 Post-traumatic stress disorder, chronic: Secondary | ICD-10-CM | POA: Diagnosis not present

## 2023-10-24 DIAGNOSIS — F411 Generalized anxiety disorder: Secondary | ICD-10-CM | POA: Diagnosis not present

## 2023-10-24 NOTE — Progress Notes (Signed)
  Virtual Visit via Video Note  I connected with Lindsay [name] on 10/24/23 at  9:00 AM EDT by a video enabled telemedicine application and verified that I am speaking with the correct person using two identifiers.  Location: Patient: driving to appointment with her transportation Provider: home office   I discussed the limitations of evaluation and management by telemedicine and the availability of in person appointments. The patient expressed understanding and agreed to proceed.   I discussed the assessment and treatment plan with the patient. The patient was provided an opportunity to ask questions and all were answered. The patient agreed with the plan and demonstrated an understanding of the instructions.   The patient was advised to call back or seek an in-person evaluation if the symptoms worsen or if the condition fails to improve as anticipated.  I provided 20 minutes of non-face-to-face time during this encounter.  THERAPIST PROGRESS NOTE  Session Time: 9:00 AM to 9:20 AM  Participation Level: Active  Behavioral Response: CasualAlertappropriate  Type of Therapy: Individual Therapy  Treatment Goals addressed:  Patient reports therapy helps cope with her stressors continue to work on coping strategies to help improve symptoms of her diagnoses, coping in general  ProgressTowards Goals: Progressing-checked in with patient on her way to appointment noted positive news of getting to her tests needing to be done to find out cause of some of her symptoms therapist continues to be important support for patient as she navigates medical issues and medical system  Interventions: Solution Focused, Strength-based, Supportive, and Other: Coping  Summary: Lindsay [name] is a 53 y.o. female who presents with did back injections with spine specialist last week, going today to check in with pain management not as much pain in back. Ultrasound Wed, Nov 4 sleep specialist.  Therapist pointed out  positive she has these appointments checked about her breathing still not good.  Patient in transport to her appointment so session was shorter.  Patient made therapist laugh that she never wants to miss her appointments indicating they are important to her.  Therapist checked in and reviewed symptoms recent events providing supportive strength-based intervention. Therapist offered emotional support, active listening and validation of patient's emotional experience as appropriate during session.   Suicidal/Homicidal: No  Plan: Return again in 1 week.2.Look at emotional regulation sheets for trauma process thoughts and feelings in session to help with coping  Diagnosis: Major depressive disorder, recurrent, moderate, generalized anxiety disorder, chronic PTSD, panic attacks  Collaboration of Care: Other none needed  Patient/Guardian was advised Release of Information must be obtained prior to any record release in order to collaborate their care with an outside provider. Patient/Guardian was advised if they have not already done so to contact the registration department to sign all necessary forms in order for us  to release information regarding their care.   Consent: Patient/Guardian gives verbal consent for treatment and assignment of benefits for services provided during this visit. Patient/Guardian expressed understanding and agreed to proceed.   Ronal Sink, LCSW 10/24/2023

## 2023-10-27 ENCOUNTER — Ambulatory Visit (HOSPITAL_COMMUNITY)
Admission: RE | Admit: 2023-10-27 | Discharge: 2023-10-27 | Disposition: A | Discharge status: Home / Self Care | Source: Ambulatory Visit | Attending: Physician Assistant | Admitting: Physician Assistant

## 2023-10-27 DIAGNOSIS — R109 Unspecified abdominal pain: Secondary | ICD-10-CM | POA: Diagnosis present

## 2023-10-27 DIAGNOSIS — R0609 Other forms of dyspnea: Secondary | ICD-10-CM | POA: Diagnosis present

## 2023-10-27 DIAGNOSIS — R1900 Intra-abdominal and pelvic swelling, mass and lump, unspecified site: Secondary | ICD-10-CM | POA: Diagnosis present

## 2023-10-27 DIAGNOSIS — K7460 Unspecified cirrhosis of liver: Secondary | ICD-10-CM | POA: Insufficient documentation

## 2023-10-28 ENCOUNTER — Other Ambulatory Visit: Payer: Self-pay | Admitting: Physician Assistant

## 2023-10-29 ENCOUNTER — Encounter (HOSPITAL_BASED_OUTPATIENT_CLINIC_OR_DEPARTMENT_OTHER): Payer: Self-pay | Admitting: Pulmonary Disease

## 2023-10-30 ENCOUNTER — Ambulatory Visit

## 2023-10-30 ENCOUNTER — Ambulatory Visit (INDEPENDENT_AMBULATORY_CARE_PROVIDER_SITE_OTHER): Admitting: Licensed Clinical Social Worker

## 2023-10-30 DIAGNOSIS — F331 Major depressive disorder, recurrent, moderate: Secondary | ICD-10-CM

## 2023-10-30 DIAGNOSIS — F4312 Post-traumatic stress disorder, chronic: Secondary | ICD-10-CM | POA: Diagnosis not present

## 2023-10-30 DIAGNOSIS — F410 Panic disorder [episodic paroxysmal anxiety] without agoraphobia: Secondary | ICD-10-CM

## 2023-10-30 DIAGNOSIS — F411 Generalized anxiety disorder: Secondary | ICD-10-CM

## 2023-10-30 NOTE — Progress Notes (Addendum)
 Virtual Visit via Video Note  I connected with Lindsay [name] on 10/30/23 at  8:00 AM EDT by a video enabled telemedicine application and verified that I am speaking with the correct person using two identifiers.  Location: Patient: home Provider: home office   I discussed the limitations of evaluation and management by telemedicine and the availability of in person appointments. The patient expressed understanding and agreed to proceed.   I discussed the assessment and treatment plan with the patient. The patient was provided an opportunity to ask questions and all were answered. The patient agreed with the plan and demonstrated an understanding of the instructions.   The patient was advised to call back or seek an in-person evaluation if the symptoms worsen or if the condition fails to improve as anticipated.  I provided 40 minutes of non-face-to-face time during this encounter.  THERAPIST PROGRESS NOTE  Session Time: 8:00 AM to 8:40 AM  Participation Level: Active  Behavioral Response: CasualAlertAnxious and Irritable  Type of Therapy: Individual Therapy  Treatment Goals addressed: Patient reports therapy helps cope with her stressors continue to work on coping strategies to help improve symptoms of her diagnoses, coping in general ProgressTowards Goals: Progressing-Navigating medical system is frustrating and particularly hard for patient with different issues and some doctors in the system not being attentive to her needs not being timely in addressing needs  Interventions: Solution Focused, Strength-based, Supportive, and Other: coping  Summary: Lindsay [name] is a 53 y.o. female who presents with coughing tired of it had since August when she said had COVID. A little more than a week ago went to PCP got a medicine, helped, asked for refill and doctor said contact the pulmonary doctor hasn't said anything, haven't heard from him. Doesn't know why she could have refilled but instead  refer to pulmonary who doesn't get back to you. The pulmonary doctor is supposed to write a prescription for the mask and nebulizer and still hasn't done that.  Couldn't turn up machine the company sent somebody out the person who cam out said not  trouble shooting. There is a ticket for nebulizer not trouble shooting brought a new one in. therapist can see so much frustration with navigating system so many roadblocks, doctors not getting back to her so when there is a positive therapist reinforces reinforce that she got a new machine and patient agreed.Stayed up all night with cough.  This gave therapist perspective how much she is struggling with this.  Patient still has expanded belly PCP says Ozempic  patient doesn't believe that is it. Because of way feeling canceling iron  infusion today also not going to eye doctor today.    Pain management frustration saw coughing and didn't address it all.  Regrets not asking but realizes also is well probably not can prescribe that she is gena have to go back to the prescriber.  Doctor only comment was that she hopes she gets something better to treat it.   Had ultra sound done. Same results as April. Early states of cirrhosis before they had said fatty liver. Nodule on liver. Patient says she is sick everybody. Did back injection for ablation can't be put to sleep office worker does not seem to be aware of this saying she could do it in the office.  Pulmonary said could not be put to sleep and therapist noted as she remembers heart doctor said that as well.  Therapist wanted to know who was in charge of this and it is all  under pain management. Talking about burning nerve endings patient expression indicates and comment indicates something she would not elect to do..  Doesn't want to deal with pulmonary. Plan to be switched. Doctor not on top getting prescriptions filled. Robitussin called in does he think she is stupid and would do that if could pay for it.  Therapist  putting in words in other words not doing very much to help.  Pain has better than have been she is going to increase the milligrams not times because of breathing. She has to take into consideration her health and medication. Still needs a MRI for foot crazy hours give her to go.  As we talked patient came up with an idea do an urgent visit but do it by video for cough syrup.  Therapist pointed out through conversation one of the things that therapy helps with his insight patient had the inside but dialogue to help to get her there. A lot of pain couldn't take meds didn't have them. They could fill if change date they don't even have medicine until Sunday.  Therapist notes could add list of so many frustrations Therapist noted a lot of issues and patient realizes that need focus on some relief been up all night because of the cough.  Plan for coping Urgent care, switch pulmonary and in therapy talking about frustrations.  We explore the source of frustrations to address problems and patient explains talking things fall into place and comes up what she needs to do and ask describes that as a [place] moment therapist talked about appreciating that it provides value for both of us .  Patient reviewed has been 2 months of coughing.  Therapist assesses patient really benefits from having an outlet to talk about her frustrations as well as a support who is validating and also encouraging her to problem solve to continue to navigate the medical system.  Suicidal/Homicidal: No  Plan: Return again in 1 week.2.  And you to support patient to help her making progress to continue to address medical issues also processed thoughts and feelings in session to help with coping  Diagnosis: Major depressive disorder, recurrent, moderate, generalized anxiety disorder, chronic PTSD, panic attacks  Collaboration of Care: Other none needed  Patient/Guardian was advised Release of Information must be obtained prior to any  record release in order to collaborate their care with an outside provider. Patient/Guardian was advised if they have not already done so to contact the registration department to sign all necessary forms in order for us  to release information regarding their care.   Consent: Patient/Guardian gives verbal consent for treatment and assignment of benefits for services provided during this visit. Patient/Guardian expressed understanding and agreed to proceed.   Ronal Sink, LCSW 10/30/2023

## 2023-11-04 ENCOUNTER — Other Ambulatory Visit: Payer: Self-pay | Admitting: Pulmonary Disease

## 2023-11-06 ENCOUNTER — Other Ambulatory Visit (HOSPITAL_BASED_OUTPATIENT_CLINIC_OR_DEPARTMENT_OTHER): Payer: Self-pay | Admitting: Pulmonary Disease

## 2023-11-06 DIAGNOSIS — R051 Acute cough: Secondary | ICD-10-CM

## 2023-11-06 NOTE — Telephone Encounter (Addendum)
 SABRA

## 2023-11-06 NOTE — Telephone Encounter (Signed)
 Please advise if this refill is appropriate.  I do not see this medication in recent office visit notes.  Thank you

## 2023-11-07 ENCOUNTER — Ambulatory Visit (INDEPENDENT_AMBULATORY_CARE_PROVIDER_SITE_OTHER): Admitting: Licensed Clinical Social Worker

## 2023-11-07 DIAGNOSIS — F410 Panic disorder [episodic paroxysmal anxiety] without agoraphobia: Secondary | ICD-10-CM

## 2023-11-07 DIAGNOSIS — F411 Generalized anxiety disorder: Secondary | ICD-10-CM | POA: Diagnosis not present

## 2023-11-07 DIAGNOSIS — F331 Major depressive disorder, recurrent, moderate: Secondary | ICD-10-CM

## 2023-11-07 DIAGNOSIS — F4312 Post-traumatic stress disorder, chronic: Secondary | ICD-10-CM | POA: Diagnosis not present

## 2023-11-07 NOTE — Progress Notes (Signed)
 Virtual Visit via Video Note  I connected with Lindsay [name] on 11/07/23 at  8:00 AM EDT by a video enabled telemedicine application and verified that I am speaking with the correct person using two identifiers.  Location: Patient: home Provider: home office   I discussed the limitations of evaluation and management by telemedicine and the availability of in person appointments. The patient expressed understanding and agreed to proceed.  I discussed the assessment and treatment plan with the patient. The patient was provided an opportunity to ask questions and all were answered. The patient agreed with the plan and demonstrated an understanding of the instructions.   The patient was advised to call back or seek an in-person evaluation if the symptoms worsen or if the condition fails to improve as anticipated.  I provided 50 minutes of non-face-to-face time during this encounter.  THERAPIST PROGRESS NOTE  Session Time: 8:00 AM to 8:50 AM  Participation Level: Active  Behavioral Response: CasualAlertappropriate  Type of Therapy: Individual Therapy  Treatment Goals addressed: Patient reports therapy helps cope with her stressors continue to work on coping strategies to help improve symptoms of her diagnoses, coping in general  ProgressTowards Goals: Progressing-assessed supportive for patient to have therapist who follows her regularly knows what is going on with her medically making therapist a good support helpful as well to talk about patient's strengths from the past her awareness assess helps her with coping  Interventions: Solution Focused, Strength-based, Supportive, and Other: coping  Summary: Lindsay [name] is a 53 y.o. female who checked in with therapist asking for update for medical issues therapist knows she had issues with cough patient says taking Nyqil Cap.  Therapist asked if she had gone to urgent care and patient said no she went through service with insurance [place]  [facility], [facility]-can get treatment 24/7.  Therapist pleased to hear she can get that kind of care she was given antibiotics, cough pereles. Thinking about changing pulmonary hasn't said anything yet. Patient did get new neutralizer and two masks change out the machine. Need prescription still for neutralizer sending someone who is coming back it is too high can't turn it down. Cough, swollen belly, breathing issues. 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 Seems fast progressive since fatty liver in April. Another thing could be is Linzess . MRI told her nodule there. Scarring and different texture on liver. Monday MRI emergency ortho. For foot hurting bad seen him a month ago monitor it feels like it feels like when first broke he looked at it pigmentation, took a picture, do a MRI after result make an appointment that was the plan but was sick since then. Patient backed up with appointments.  So now going for MRI on Monday. Doesn't know if can get flu shot doctor may give one and concerned shouldn't have it. Has to do shingles shot.  Talked about advocating for daughter when younger with education system.  No one attempting to appeal decision of school board. Congratulation to her and daughter doing something like that. Assistant superintendent Darina Levers is the one who said that, met superintendent Koren Essex shake her hand woman of color patient says let kids go through cracks fighting for her daughter all these years. State superintendent shared information with him to help her advocate in [place] school [place]. Talked to [employer] related to patient what he said get themselves together or out  the door. Patient did relate that to people on school board. Sherida Hoard. Skip Massie was the [employer]. Aunt like the grandmother because mom's mom passed when  40 she was 15. Met grandfather remembers seeing him a taxi asked mom who was that mom said father. Taken back 84/55 before Dad died. Kept I ncontact wiht mom came down on weekend had girlfriend lived 20 walk from them. Used to walk to house and see her visit her. Through Ancestry died of cirrhosis she used to drink. Mom doesn't know what happened.  Patient talked about history of having charges court locked up. Called former DA gave her numbers somebody from [place]  called former DA and went from there. He made the payments for patient. Got a second chance from man in [place]  and from former DA never met personally kept up with his cases he did different things for different people. Even as DA helpful. He did for patient because a good person did things to get medical things for daughter shouldn't penalize her do that rubbed them the wrong way. Patient was making up checks on computer. A guy who knew set her up trouble with law decided to set her up he would get away no charges patient was told he got a lot of time cash most of checks. Patient generated didn't sign. Therapist processed thoughts and feelings updating medical issues as that is significant both for her and also for somebody who cares and wants updated.  Patient shared history which indicates her strengths advocating for her daughter she became very animated talking about the past and was good reason she was a good advocate she also got a help from different people on the way and wanted to share that with therapist.  Patient processed feelings released information therapist provided supportive and strength-based intervention.         Suicidal/Homicidal: No  Plan: Return again in 3 weeks.2.  Patient processed thoughts and feelings in session to help cope with stressors particularly medical as well as an outlet for release of emotion supportive and strength-based interventions  Diagnosis: Major depressive disorder, recurrent,  moderate, generalized anxiety disorder, chronic PTSD, panic attacks  Collaboration of Care: Other none needed  Patient/Guardian was advised Release of Information must be obtained prior to any record release in order to collaborate their care with an outside provider. Patient/Guardian was advised if they have not already done so to contact the registration department to sign all necessary forms in order for us  to release information regarding their care.   Consent: Patient/Guardian gives verbal consent for treatment and assignment of benefits for services provided during this visit. Patient/Guardian expressed understanding and agreed to proceed.   Ronal Sink, LCSW 11/07/2023

## 2023-11-12 ENCOUNTER — Other Ambulatory Visit: Payer: Self-pay | Admitting: Pulmonary Disease

## 2023-11-12 DIAGNOSIS — J4540 Moderate persistent asthma, uncomplicated: Secondary | ICD-10-CM

## 2023-11-23 ENCOUNTER — Other Ambulatory Visit (HOSPITAL_COMMUNITY): Payer: Self-pay | Admitting: Psychiatry

## 2023-11-23 ENCOUNTER — Other Ambulatory Visit: Payer: Self-pay | Admitting: Pulmonary Disease

## 2023-11-23 DIAGNOSIS — F331 Major depressive disorder, recurrent, moderate: Secondary | ICD-10-CM

## 2023-11-23 DIAGNOSIS — R051 Acute cough: Secondary | ICD-10-CM

## 2023-11-24 ENCOUNTER — Other Ambulatory Visit: Payer: Self-pay

## 2023-11-24 MED ORDER — BREZTRI AEROSPHERE 160-9-4.8 MCG/ACT IN AERO: 2.0000 | INHALATION_SPRAY | Freq: Two times a day (BID) | RESPIRATORY_TRACT | 3 refills | Status: AC

## 2023-11-25 ENCOUNTER — Other Ambulatory Visit (HOSPITAL_COMMUNITY): Payer: Self-pay

## 2023-11-25 DIAGNOSIS — F331 Major depressive disorder, recurrent, moderate: Secondary | ICD-10-CM

## 2023-11-25 MED ORDER — LAMOTRIGINE 150 MG PO TABS: ORAL_TABLET | ORAL | 0 refills | Status: DC

## 2023-11-26 ENCOUNTER — Other Ambulatory Visit: Payer: Self-pay | Admitting: Internal Medicine

## 2023-11-26 ENCOUNTER — Ambulatory Visit: Payer: Self-pay | Admitting: Pulmonary Disease

## 2023-11-26 ENCOUNTER — Ambulatory Visit: Admitting: Adult Health

## 2023-11-26 ENCOUNTER — Encounter: Payer: Self-pay | Admitting: Adult Health

## 2023-11-26 VITALS — BP 144/80 | HR 92 | Temp 100.9°F | Ht 71.0 in | Wt 339.0 lb

## 2023-11-26 DIAGNOSIS — R053 Chronic cough: Secondary | ICD-10-CM

## 2023-11-26 DIAGNOSIS — R058 Other specified cough: Secondary | ICD-10-CM

## 2023-11-26 DIAGNOSIS — J9611 Chronic respiratory failure with hypoxia: Secondary | ICD-10-CM | POA: Diagnosis not present

## 2023-11-26 DIAGNOSIS — U099 Post covid-19 condition, unspecified: Secondary | ICD-10-CM

## 2023-11-26 DIAGNOSIS — J441 Chronic obstructive pulmonary disease with (acute) exacerbation: Secondary | ICD-10-CM

## 2023-11-26 MED ORDER — AMOXICILLIN-POT CLAVULANATE 875-125 MG PO TABS: 1.0000 | ORAL_TABLET | Freq: Two times a day (BID) | ORAL | 0 refills | Status: DC

## 2023-11-26 MED ORDER — PREDNISONE 20 MG PO TABS: 20.0000 mg | ORAL_TABLET | Freq: Every day | ORAL | 0 refills | Status: DC

## 2023-11-26 NOTE — Patient Instructions (Addendum)
 Begin Augmentin  875mg  Twice daily  for 1 week, take with food.  Begin Prednsione 20mg  daily for 5 days  Delsym 2 tsp Twice daily  As needed  cough  Continue on Breztri  2 puffs Twice daily rinse after use.  Albuterol  inhaler or neb As needed   CT chest as planned per PCP.  Continue on Oxygen  5l/m with activity.  Follow up in 1 week in person for evaluation and As needed   Please contact office for sooner follow up if symptoms do not improve or worsen or seek emergency care

## 2023-11-26 NOTE — Telephone Encounter (Signed)
 Noted, appointment with Tammy at 11 am (video visit)

## 2023-11-26 NOTE — Progress Notes (Signed)
 Virtual Visit via Video Note  I connected with Lindsay [name] on 11/26/23 at 11:00 AM EDT by a video enabled telemedicine application and verified that I am speaking with the correct person using two identifiers.  Location: Patient: Waiting room -PCP   Provider: Office     I discussed the limitations of evaluation and management by telemedicine and the availability of in person appointments. The patient expressed understanding and agreed to proceed.  History of Present Illness: 53 yo female followed for COPD, Chronic Respiratory Failure on Oxygen  and OSA not on CPAP , Pulmonary HTN Medical history significant for Diastolic CHF, Depression, PTSD  Today's video visit is an acute office visit for cough, fever, shortness of breath.  Discussed the use of AI scribe software for clinical note transcription with the patient, who gave verbal consent to proceed.  History of Present Illness Lindsay [name] is a 53 year old female with COPD and chronic respiratory failure on oxygen  who presents with a persistent cough post-COVID-19 infection from August 2025  She has been experiencing a persistent cough since contracting COVID-19 two months ago. The cough temporarily subsides with medication but returns after three to four days. Sputum is sometimes brownish or yellow, although it is not consistently present. No significant fever, with the highest recorded temperature being 100.29F. She says over the last week she has been feeling worse with low-grade fever, productive cough wheezing and shortness of breath.  She was seen by her primary care provider this morning recommended to see her pulmonologist.  She has been recommended for CT chest that is pending.  She has a history of COPD and is currently on Breztri  twice a day. She uses a albuterol  nebulizer at home and is on 5 liters of oxygen , which she conserves when outside due to limited supply. A chest x-ray was performed about a month ago that showed no  acute process.  She was called and prednisone  earlier this week but did not receive it.  Her past medical history includes sleep apnea, for which she is not currently using a CPAP machine due to needing a repeat sleep study  Denies chest pain, orthopnea, calf pain or increased edema.  Appetite is fair.   Observations/Objective: PFT 07/09/23 FEV1 53%, ratio 72, FVC 57%, no BD response, DLCO 52%  11/26/2023 appears in no acute distress positive cough throughout visit  Assessment and Plan:  Assessment & Plan Persistent cough following COVID-19 infection   She has experienced a persistent cough for two months post-COVID-19 infection, occasionally producing brownish or yellow sputum. A chest x-ray was performed last month that was unremarkable and a CT chest scan without contrast is ordered for further evaluation and pending from primary care. Prescribe Augmentin  875 mg twice daily for 1 week and prednisone  20 mg daily for 5 days. Recommend Delsym twice a day for cough. Continue Breztri  inhaler twice a day.  Follow-up in 1 week and as needed.  Acute COPD exacerbation -slow to resolve after COVID-19 infection several weeks ago -begin Augmentin  x 1 week, prednisone  20 mg daily for 5 days continue on Breztri  twice daily Albuterol  inhaler or nebulizer as needed Chronic respiratory failure continue on oxygen  to maintain O2 saturations greater than 88 to 9%  Patient needs an in office visit.  Will have her follow-up in 1 week if symptoms or not improving she is to seek urgent care or emergency room evaluation  Patient Instructions  Begin Augmentin  875mg  Twice daily  for 1 week, take with food.  Begin Prednsione 20mg  daily for 5 days  Delsym 2 tsp Twice daily  As needed  cough  Continue on Breztri  2 puffs Twice daily rinse after use.  Albuterol  inhaler or neb As needed   CT chest as planned per PCP.  Continue on Oxygen  5l/m with activity.  Follow up in 1 week in person for evaluation and As needed    Please contact office for sooner follow up if symptoms do not improve or worsen or seek emergency care        Follow Up Instructions:    I discussed the assessment and treatment plan with the patient. The patient was provided an opportunity to ask questions and all were answered. The patient agreed with the plan and demonstrated an understanding of the instructions.   The patient was advised to call back or seek an in-person evaluation if the symptoms worsen or if the condition fails to improve as anticipated.  I provided 30  minutes of non-face-to-face time during this encounter.   Madelin Stank, NP

## 2023-11-26 NOTE — Telephone Encounter (Signed)
 FYI Only or Action Required?: FYI only for provider: appointment scheduled on 10/29.  Patient is followed in Pulmonology for COPD, last seen on 09/16/2023 by Neda Jennet LABOR, MD.  Called Nurse Triage reporting Cough.  Symptoms began 2-3 months ago.  Symptoms are: gradually worsening.  Triage Disposition: See Physician Within 24 Hours  Patient/caregiver understands and will follow disposition?: Yes    Copied from CRM #[id number]. Topic: Clinical - Red Word Triage >> Nov 26, 2023  9:50 AM Rozanna MATSU wrote: Red Word that prompted transfer to Nurse Triage: coughing discolored      Reason for Disposition  [1] Known COPD or other severe lung disease (i.e., bronchiectasis, cystic fibrosis, lung surgery) AND [2] symptoms getting worse (i.e., increased sputum purulence or amount, increased breathing difficulty  Answer Assessment - Initial Assessment Questions Patient states she does not have transportation to come in for an appointment. Video appointment scheduled per her request.       1. ONSET: When did the cough begin?      2 months  2. SEVERITY: How bad is the cough today?      Moderate  3. SPUTUM: Describe the color of your sputum (e.g., none, dry cough; clear, white, yellow, green)     Brown/Yellow  4. HEMOPTYSIS: Are you coughing up any blood? If Yes, ask: How much? (e.g., flecks, streaks, tablespoons, etc.)     No 5. DIFFICULTY BREATHING: Are you having difficulty breathing? If Yes, ask: How bad is it? (e.g., mild, moderate, severe)      Some difficulty breathing due to the cough  6. FEVER: Do you have a fever? If Yes, ask: What is your temperature, how was it measured, and when did it start?     99.8 F to 100.2 F 7. CARDIAC HISTORY: Do you have any history of heart disease? (e.g., heart attack, congestive heart failure)      Yes 8. LUNG HISTORY: Do you have any history of lung disease?  (e.g., pulmonary embolus, asthma, emphysema)     COPD 9. PE  RISK FACTORS: Do you have a history of blood clots? (or: recent major surgery, recent prolonged travel, bedridden)     No 10. OTHER SYMPTOMS: Do you have any other symptoms? (e.g., runny nose, wheezing, chest pain)       Nasal congestion  Protocols used: Cough - Acute Productive-A-AH

## 2023-11-28 ENCOUNTER — Ambulatory Visit (INDEPENDENT_AMBULATORY_CARE_PROVIDER_SITE_OTHER): Admitting: Licensed Clinical Social Worker

## 2023-11-28 DIAGNOSIS — F4312 Post-traumatic stress disorder, chronic: Secondary | ICD-10-CM

## 2023-11-28 DIAGNOSIS — F331 Major depressive disorder, recurrent, moderate: Secondary | ICD-10-CM | POA: Diagnosis not present

## 2023-11-28 DIAGNOSIS — F411 Generalized anxiety disorder: Secondary | ICD-10-CM | POA: Diagnosis not present

## 2023-11-28 DIAGNOSIS — F410 Panic disorder [episodic paroxysmal anxiety] without agoraphobia: Secondary | ICD-10-CM | POA: Diagnosis not present

## 2023-11-28 NOTE — Progress Notes (Signed)
 Virtual Visit via Video Note  I connected with Ree Moncrieffe on 11/28/23 at  9:00 AM EDT by a video enabled telemedicine application and verified that I am speaking with the correct person using two identifiers.  Location: Patient: home Provider: home office   I discussed the limitations of evaluation and management by telemedicine and the availability of in person appointments. The patient expressed understanding and agreed to proceed.   I discussed the assessment and treatment plan with the patient. The patient was provided an opportunity to ask questions and all were answered. The patient agreed with the plan and demonstrated an understanding of the instructions.   The patient was advised to call back or seek an in-person evaluation if the symptoms worsen or if the condition fails to improve as anticipated.  I provided 45 minutes of non-face-to-face time during this encounter.  THERAPIST PROGRESS NOTE  Session Time: 9:00 AM to 9:45 AM  Participation Level: Active  Behavioral Response: CasualAlertAnxious  Type of Therapy: Individual Therapy  Treatment Goals addressed: Patient reports therapy helps cope with her stressors continue to work on coping strategies to help improve symptoms of her diagnoses, coping in general  ProgressTowards Goals: Progressing-patient has significant medical issues assess helpful for her to have someone who falls that knows what is going on listens is supportive helps her with coping strength of therapeutic relationship very helpful for therapeutic interventions.  Interventions: Solution Focused, Strength-based, Supportive, Reframing, and Other: coping  Summary: Sundus Pete is a 53 y.o. female who presents with headache full blown can't shake. Aching because of muscles looked up the medicine she takes for that affects something with brain and nervous system, muscles tight. Really bad never happened before haven't taken since heart cauterization took  yesterday headache, kind of going away, up all night. Video visit in PCP examination room with pulmonologist  Concerned about cough. Told her don't come with sick as patient has a cough, patient says already sick, follow up in place, cough and swollen stomach she didn't say anything about cough, wanted him to reach out GI to advocate cirrhosis liver disease.  Therapist used reframing frustrated with her primary care doctor but really glad she got into see pulmonologist as breathing has been the most critical issue.  Patient says has to find someone else for PCP. Think she got through with pulmonary because of primary care asking for that.  Therapist had reviewed the note happy that she is getting some medications patient does not notice a difference yet.  He was the one asking to call ended up patient being the one to call and then after that he is asking what they want?  Patient sees is crazy.  Also pulmonologist is his superior he starts going crazy and patient asks what does that have to do with her health.  From earlier this year to now went from fatty liver to cirrhosis need to see a doctor. Argument in office about getting the CT. patient explaining does not have transportation to get there patient imitated how he interacted it.  As if he was being bossy with her in the office.  Argument about getting CT patient ripped the paper because of frustration does not feel listening.  She is done has to find somebody fine who is in network but not easy how far away difficult a lot of things going on in her life. Going to ask for somebody to fit her in for GI. Want them to drain the fluid. Drain the abdomen  non invasive can't be put to sleep. Not listen to her started to cry in the office thought to herself fuck this African didn't have Lamictal  which keeps her balanced. Stomach swollen push up on lungs affects her breathing. Has coming up appointment for iron  infusion, lab, hematologist have to go go back to [place]  sleep study at night this is all on 4th thinks may cancel the appointment in morning.  This does think it is a lot for her to do and can see what she would want to do that.  May change [facility] doctor retired earlier this year. Fed up with PCP and GI not listening.  Therapist asked her also about pain issues another medical concern patient says pain some days better than others.  Assessed patient really benefits from therapist listening following her care able to voice her frustrations therapist providing supportive interventions to encourage patient with getting care for her medical issues.       Suicidal/Homicidal: No  Plan: Return again in 2 weeks.2.  Processed thoughts and feelings in session to help with coping with patient's stressors  Diagnosis: Major depressive disorder, recurrent, moderate, generalized anxiety disorder, chronic PTSD, panic attacks  Collaboration of Care: Other Pulmonology note by Madelin Stank NP on 11/26/23   Patient/Guardian was advised Release of Information must be obtained prior to any record release in order to collaborate their care with an outside provider. Patient/Guardian was advised if they have not already done so to contact the registration department to sign all necessary forms in order for us  to release information regarding their care.   Consent: Patient/Guardian gives verbal consent for treatment and assignment of benefits for services provided during this visit. Patient/Guardian expressed understanding and agreed to proceed.   Ronal Sink, LCSW 11/28/2023

## 2023-11-29 ENCOUNTER — Other Ambulatory Visit (HOSPITAL_COMMUNITY): Payer: Self-pay | Admitting: Psychiatry

## 2023-11-29 DIAGNOSIS — F4312 Post-traumatic stress disorder, chronic: Secondary | ICD-10-CM

## 2023-11-29 DIAGNOSIS — F331 Major depressive disorder, recurrent, moderate: Secondary | ICD-10-CM

## 2023-11-29 DIAGNOSIS — F411 Generalized anxiety disorder: Secondary | ICD-10-CM

## 2023-12-01 ENCOUNTER — Other Ambulatory Visit: Payer: Self-pay | Admitting: Pulmonary Disease

## 2023-12-01 DIAGNOSIS — G4733 Obstructive sleep apnea (adult) (pediatric): Secondary | ICD-10-CM

## 2023-12-02 ENCOUNTER — Inpatient Hospital Stay: Attending: Hematology and Oncology

## 2023-12-02 ENCOUNTER — Ambulatory Visit (HOSPITAL_BASED_OUTPATIENT_CLINIC_OR_DEPARTMENT_OTHER): Attending: Pulmonary Disease | Admitting: Pulmonary Disease

## 2023-12-02 ENCOUNTER — Inpatient Hospital Stay: Admitting: Hematology and Oncology

## 2023-12-02 DIAGNOSIS — Z79899 Other long term (current) drug therapy: Secondary | ICD-10-CM | POA: Diagnosis not present

## 2023-12-02 DIAGNOSIS — G4733 Obstructive sleep apnea (adult) (pediatric): Secondary | ICD-10-CM | POA: Diagnosis present

## 2023-12-02 DIAGNOSIS — Z9981 Dependence on supplemental oxygen: Secondary | ICD-10-CM | POA: Diagnosis not present

## 2023-12-04 ENCOUNTER — Ambulatory Visit: Payer: Self-pay | Admitting: Adult Health

## 2023-12-04 ENCOUNTER — Ambulatory Visit: Admitting: Adult Health

## 2023-12-04 ENCOUNTER — Ambulatory Visit (INDEPENDENT_AMBULATORY_CARE_PROVIDER_SITE_OTHER)

## 2023-12-04 ENCOUNTER — Encounter: Payer: Self-pay | Admitting: Adult Health

## 2023-12-04 VITALS — BP 122/78 | HR 112 | Temp 98.6°F | Ht 71.0 in | Wt 352.6 lb

## 2023-12-04 DIAGNOSIS — E119 Type 2 diabetes mellitus without complications: Secondary | ICD-10-CM

## 2023-12-04 DIAGNOSIS — R058 Other specified cough: Secondary | ICD-10-CM | POA: Diagnosis not present

## 2023-12-04 DIAGNOSIS — J181 Lobar pneumonia, unspecified organism: Secondary | ICD-10-CM

## 2023-12-04 DIAGNOSIS — J9611 Chronic respiratory failure with hypoxia: Secondary | ICD-10-CM

## 2023-12-04 DIAGNOSIS — N189 Chronic kidney disease, unspecified: Secondary | ICD-10-CM

## 2023-12-04 DIAGNOSIS — J441 Chronic obstructive pulmonary disease with (acute) exacerbation: Secondary | ICD-10-CM

## 2023-12-04 DIAGNOSIS — B379 Candidiasis, unspecified: Secondary | ICD-10-CM

## 2023-12-04 DIAGNOSIS — Z7985 Long-term (current) use of injectable non-insulin antidiabetic drugs: Secondary | ICD-10-CM

## 2023-12-04 DIAGNOSIS — G4733 Obstructive sleep apnea (adult) (pediatric): Secondary | ICD-10-CM

## 2023-12-04 LAB — BASIC METABOLIC PANEL WITH GFR
BUN: 12 mg/dL (ref 6–23)
CO2: 29 meq/L (ref 19–32)
Calcium: 9 mg/dL (ref 8.4–10.5)
Chloride: 100 meq/L (ref 96–112)
Creatinine, Ser: 1.07 mg/dL (ref 0.40–1.20)
GFR: 59.13 mL/min — ABNORMAL LOW (ref >60.00)
Glucose, Bld: 100 mg/dL — ABNORMAL HIGH (ref 70–99)
Potassium: 4.1 meq/L (ref 3.5–5.1)
Sodium: 137 meq/L (ref 135–145)

## 2023-12-04 LAB — CBC WITH DIFFERENTIAL/PLATELET
Basophils Absolute: 0 K/uL (ref 0.0–0.1)
Basophils Relative: 0.7 % (ref 0.0–3.0)
Eosinophils Absolute: 0.2 K/uL (ref 0.0–0.7)
Eosinophils Relative: 2.9 % (ref 0.0–5.0)
HCT: 37.5 % (ref 36.0–46.0)
Hemoglobin: 12 g/dL (ref 12.0–15.0)
Lymphocytes Relative: 36.9 % (ref 12.0–46.0)
Lymphs Abs: 2.5 K/uL (ref 0.7–4.0)
MCHC: 31.9 g/dL (ref 30.0–36.0)
MCV: 81 fl (ref 78.0–100.0)
Monocytes Absolute: 0.7 K/uL (ref 0.1–1.0)
Monocytes Relative: 9.9 % (ref 3.0–12.0)
Neutro Abs: 3.3 K/uL (ref 1.4–7.7)
Neutrophils Relative %: 49.6 % (ref 43.0–77.0)
Platelets: 376 K/uL (ref 150.0–400.0)
RBC: 4.63 Mil/uL (ref 3.87–5.11)
RDW: 17 % — ABNORMAL HIGH (ref 11.5–15.5)
WBC: 6.7 K/uL (ref 4.0–10.5)

## 2023-12-04 LAB — D-DIMER, QUANTITATIVE: D-Dimer, Quant: 0.34 ug{FEU}/mL (ref <0.50)

## 2023-12-04 LAB — BRAIN NATRIURETIC PEPTIDE: Pro B Natriuretic peptide (BNP): 11 pg/mL (ref 0.0–100.0)

## 2023-12-04 MED ORDER — BENZONATATE 200 MG PO CAPS: 200.0000 mg | ORAL_CAPSULE | Freq: Three times a day (TID) | ORAL | 1 refills | Status: AC | PRN

## 2023-12-04 MED ORDER — HYDROCODONE BIT-HOMATROP MBR 5-1.5 MG/5ML PO SOLN: 5.0000 mL | Freq: Every evening | ORAL | 0 refills | Status: DC | PRN

## 2023-12-04 MED ORDER — AMOXICILLIN-POT CLAVULANATE 875-125 MG PO TABS: 1.0000 | ORAL_TABLET | Freq: Two times a day (BID) | ORAL | 0 refills | Status: AC

## 2023-12-04 MED ORDER — FLUCONAZOLE 150 MG PO TABS: 150.0000 mg | ORAL_TABLET | Freq: Every day | ORAL | 0 refills | Status: AC

## 2023-12-04 MED ORDER — PREDNISONE 10 MG PO TABS: ORAL_TABLET | ORAL | 0 refills | Status: DC

## 2023-12-04 MED ORDER — ALBUTEROL SULFATE (2.5 MG/3ML) 0.083% IN NEBU
2.5000 mg | INHALATION_SOLUTION | Freq: Once | RESPIRATORY_TRACT | Status: AC
  Administered 2023-12-04: 2.5 mg via RESPIRATORY_TRACT

## 2023-12-04 MED ORDER — METHYLPREDNISOLONE ACETATE 80 MG/ML IJ SUSP
120.0000 mg | Freq: Once | INTRAMUSCULAR | Status: AC
  Administered 2023-12-04: 120 mg via INTRAMUSCULAR

## 2023-12-04 MED ORDER — HYDROCODONE BIT-HOMATROP MBR 5-1.5 MG/5ML PO SOLN: 5.0000 mL | Freq: Four times a day (QID) | ORAL | 0 refills | Status: AC | PRN

## 2023-12-04 NOTE — Progress Notes (Signed)
 @Patient  ID: Lindsay [name], female    DOB: 02/11/1970, 53 y.o.   MRN: [id number]  Chief Complaint  Patient presents with   COPD    F/U    Referring provider: Catalina Bare, MD  HPI: 53 year old female former smoker followed for COPD, chronic respiratory failure on oxygen  and obstructive sleep apnea not on CPAP, pulmonary hypertension Medical history significant for diastolic heart failure, depression and PTSD    TEST/EVENTS : Reviewed 12/04/2023  PFTs August 22, 2023 sleep showed moderate restriction and obstruction with FEV1 at 53%, ratio 72, FVC 57%, severe mid flow obstruction, no significant bronchodilator response, DLCO 52%.,  Current weight 337 pounds.  CT chest May 23, 2023 atelectasis left base, mild left hemidiaphragm elevation, unchanged 5 mm left lower lobe pulmonary nodule, negative PE  CT chest January 12, 2023 5 mm left lower lobe nodule, patchy left lower lobe opacity, negative PE  Right and left heart coronary angiography- EF adequate, mild pulmonary hypertension   Discussed the use of AI scribe software for clinical note transcription with the patient, who gave verbal consent to proceed.  History of Present Illness Lindsay [name] is a 53 year old female who presents with persistent cough post-COVID infection that she had in August 2025  She has experienced a persistent, continuous cough since August 2025  following a COVID-19 infection, significantly impacting her daily life. She uses Breztri , albuterol  inhaler and nebulizer but continues to have ongoing cough that seems to be worsening.  Patient was seen 1 week ago for a severe COPD exacerbation and significant coughing.  She was having low-grade fever along with productive cough with brown-yellow mucus.  She was started on Augmentin  x 7 days which she completed yesterday along with prednisone  20 mg daily for 5 days.  Patient says she has not improved continues to have ongoing cough congestion and some  blood-tinged mucus.  She denies any calf pain or chest pain.  No fever.  Initially treated for COVID-19 in August was given antiviral therapy.  Chest x-ray August and September were clear.  Patient was set up for chest x-ray today that shows patchy right middle lobe airspace opacity consistent with pneumonia.   She uses oxygen  at home,five liters, to maintain adequate oxygen  levels. No history of blood clots and she is not on blood thinners. She takes metoprolol  25 mg at night for blood pressure management   She has a history of diabetes, managed with Ozempic , but has paused this medication due to abdominal issues. She also has cirrhosis of the liver and chronic kidney disease, which fluctuates in severity.  A sleep study was completed recently, results are pending . She uses a fluid pill, torsemide  . she also takes oxycodone  as needed, approximately half a pill a week, and is cautious about using it with other sedating medications.     Allergies  Allergen Reactions   Lisinopril  Rash and Cough    Immunization History  Administered Date(s) Administered   Influenza Split 11/27/2010   Influenza Whole 11/23/2008   Influenza, Seasonal, Injecte, Preservative Fre 01/14/2023   Influenza,inj,Quad PF,6+ Mos 10/20/2014, 10/09/2015, 11/04/2016, 11/09/2017, 11/18/2018, 11/26/2019, 11/08/2020, 01/09/2022   PFIZER(Purple Top)SARS-COV-2 Vaccination 04/17/2019, 05/08/2019, 03/28/2020   PNEUMOCOCCAL CONJUGATE-20 01/14/2023   Pneumococcal Polysaccharide-23 08/12/2018, 11/26/2019    Past Medical History:  Diagnosis Date   Acid reflux    Anemia    Iron  Def   Anorexia    CHF (congestive heart failure) (HCC)    Chronic kidney disease    Nephrotic  syndrome   Colon polyp 2009   Depression with anxiety    Edema leg    Fibroid tumor 04/2022   Fibromyalgia    Hemorrhoids    Hidradenitis suppurativa    Hypertension    IBS (irritable bowel syndrome)    Low back pain    Migraines    Morbidly obese  (HCC)    Neuromuscular disorder (HCC)    fibromyalgia   Neuropathy    Panic attacks    Polyp of vocal cord or larynx    Stroke (HCC)    Tonsil pain     Tobacco History: Social History   Tobacco Use  Smoking Status Former   Current packs/day: 0.00   Average packs/day: 0.1 packs/day for 25.0 years (2.5 ttl pk-yrs)   Types: Cigarettes   Start date: 05/24/1994   Quit date: 05/24/2019   Years since quitting: 4.5  Smokeless Tobacco Never  Tobacco Comments   quit in April.   Counseling given: Not Answered Tobacco comments: quit in April.   Outpatient Medications Prior to Visit  Medication Sig Dispense Refill   ACCU-CHEK GUIDE test strip USE ONE TEST STRIP TO CHECK BLOOD SUGARS ONCE A DAY AS NEEDED 100 strip 1   Accu-Chek Softclix Lancets lancets USE ONE LANCET TO CHECK BLOOD SUGAR ONE A DAY AS NEEDED 100 each 1   acetaminophen  (TYLENOL ) 500 MG tablet Take 1 tablet (500 mg total) by mouth every 6 (six) hours as needed. 30 tablet 0   albuterol  (PROVENTIL ) (2.5 MG/3ML) 0.083% nebulizer solution INHALE 3 ML BY NEBULIZATION EVERY 6 HOURS AS NEEDED FOR WHEEZING OR SHORTNESS OF BREATH 360 mL 11   albuterol  (VENTOLIN  HFA) 108 (90 Base) MCG/ACT inhaler 2 PUFFS INHALED EVERY 6 HOURS AS NEEDED FOR 30 DAYS 6.7 each 1   amoxicillin -clavulanate (AUGMENTIN ) 875-125 MG tablet Take 1 tablet by mouth 2 (two) times daily. 14 tablet 0   atorvastatin  (LIPITOR) 40 MG tablet TAKE 1 TABLET BY MOUTH EVERY DAY 90 tablet 3   Brexpiprazole  (REXULTI ) 3 MG TABS Take 1 tablet (3 mg total) by mouth daily. 30 tablet 2   budesonide -glycopyrrolate-formoterol  (BREZTRI  AEROSPHERE) 160-9-4.8 MCG/ACT AERO inhaler Inhale 2 puffs into the lungs in the morning and at bedtime. 10.7 g 3   dicyclomine  (BENTYL ) 10 MG capsule TAKE 1 CAPSULE (10 MG TOTAL) BY MOUTH 3 (THREE) TIMES DAILY AS NEEDED FOR SPASMS (AB PAIN, DIARRHEA). 270 capsule 1   diltiazem  (CARDIZEM  CD) 120 MG 24 hr capsule Take 1 capsule (120 mg total) by mouth daily.  30 capsule 6   doxepin  (SINEQUAN ) 25 MG capsule Take 1 capsule (25 mg total) by mouth at bedtime. 30 capsule 2   guaiFENesin -dextromethorphan (ROBITUSSIN DM) 100-10 MG/5ML syrup Take 10 mLs by mouth every 4 (four) hours as needed for cough. 236 mL 0   hydrocortisone  (ANUSOL -HC) 2.5 % rectal cream PLACE 1 APPLICATION RECTALLY 2 (TWO) TIMES DAILY. 30 g 2   ipratropium-albuterol  (DUONEB) 0.5-2.5 (3) MG/3ML SOLN TAKE 3 MLS BY NEBULIZATION 2 (TWO) TIMES DAILY. 180 mL 1   lamoTRIgine  (LAMICTAL ) 150 MG tablet Take 1/2 tab twice  daily 30 tablet 0   LINZESS  290 MCG CAPS capsule TAKE 1 CAPSULE BY MOUTH DAILY BEFORE BREAKFAST. 90 capsule 0   meclizine  (ANTIVERT ) 25 MG tablet Take 25 mg by mouth 3 (three) times daily.     omeprazole  (PRILOSEC) 40 MG capsule TAKE 1 CAPSULE (40 MG TOTAL) BY MOUTH DAILY. 90 capsule 0   ondansetron  (ZOFRAN -ODT) 8 MG disintegrating tablet Take  1 tablet (8 mg total) by mouth every 8 (eight) hours as needed. 90 tablet 0   oxyCODONE -acetaminophen  (PERCOCET/ROXICET) 5-325 MG tablet Take by mouth. (Patient taking differently: Take by mouth.)     OXYGEN  Inhale into the lungs. 5 LITERS AT HOME, 3 LITERS WHEN OUTSIDE HOME     promethazine  (PHENERGAN ) 25 MG tablet TAKE 1 TABLET BY MOUTH EVERY 6 HOURS AS NEEDED FOR NAUSEA OR VOMITING. 120 tablet 1   Safety Seal Miscellaneous MISC Apply 1 Application topically in the morning. Medication Name; AA Gel 30 g 6   SUMAtriptan  (IMITREX ) 25 MG tablet Take 1 tablet (25 mg total) by mouth once for 1 dose. May repeat in 2 hours if headache persists or recurs. 10 tablet 0   tiZANidine  (ZANAFLEX ) 4 MG tablet Take 4 mg by mouth every 8 (eight) hours as needed for muscle spasms.     torsemide  (DEMADEX ) 20 MG tablet TAKE 1 TABLET BY MOUTH TWICE A DAY 180 tablet 1   fluconazole  (DIFLUCAN ) 150 MG tablet Take 150 mg by mouth 2 (two) times daily. (Patient not taking: Reported on 12/04/2023)     fluticasone  (FLONASE ) 50 MCG/ACT nasal spray SPRAY 1 SPRAY INTO EACH  NOSTRIL DAILY (Patient not taking: Reported on 12/04/2023) 32 mL 1   predniSONE  (DELTASONE ) 20 MG tablet Take 1 tablet (20 mg total) by mouth daily with breakfast. (Patient not taking: Reported on 12/04/2023) 5 tablet 0   promethazine -dextromethorphan (PROMETHAZINE -DM) 6.25-15 MG/5ML syrup TAKE 5 ML BY MOUTH EVERY 6 HOURS (Patient not taking: Reported on 12/04/2023) 200 mL 0   Semaglutide , 2 MG/DOSE, (OZEMPIC , 2 MG/DOSE,) 8 MG/3ML SOPN INJECT 2 MG INTO THE SKIN ONCE A WEEK. (Patient not taking: Reported on 12/04/2023) 9 mL 1   No facility-administered medications prior to visit.     Review of Systems:   Constitutional:   No  weight loss, night sweats,  Fevers, chills,+ fatigue, or  lassitude.  HEENT:   No headaches,  Difficulty swallowing,  Tooth/dental problems, or  Sore throat,                No sneezing, itching, ear ache, nasal congestion, post nasal drip,   CV:  No chest pain,  Orthopnea, PND, swelling in lower extremities, anasarca, dizziness, palpitations, syncope.   GI  No heartburn, indigestion, abdominal pain, nausea, vomiting, diarrhea, change in bowel habits, loss of appetite, bloody stools.   Resp: .  No chest wall deformity  Skin: no rash or lesions.  GU: no dysuria, change in color of urine, no urgency or frequency.  No flank pain, no hematuria   MS:  No joint pain or swelling.  No decreased range of motion.  No back pain.    Physical Exam  BP 122/78   Pulse (!) 112   Temp 98.6 F (37 C)   Ht 5' 11 (1.803 m) Comment: per pt  Wt (!) 352 lb 9.6 oz (159.9 kg)   SpO2 97% Comment: 5L cont  BMI 49.18 kg/m   GEN: A/Ox3; pleasant , NAD, well nourished ++ cough    HEENT:  [place]/AT,   NOSE-clear, THROAT-clear, no lesions, no postnasal drip or exudate noted.   NECK:  Supple w/ fair ROM; no JVD; normal carotid impulses w/o bruits; no thyromegaly or nodules palpated; no lymphadenopathy.    RESP few trace rhonchi . no accessory muscle use, no dullness to percussion  CARD:   RRR, no m/r/g, no peripheral edema, pulses intact, no cyanosis or clubbing.  GI:  Soft & nt; nml bowel sounds; no organomegaly or masses detected.   Musco: Warm bil, no deformities or joint swelling noted.   Neuro: alert, no focal deficits noted.    Skin: Warm, no lesions or rashes    Lab Results:Reviewed 12/04/2023   CBC    Component Value Date/Time   WBC 10.0 10/10/2023 1017   RBC 5.02 10/10/2023 1017   HGB 12.9 10/10/2023 1017   HGB 11.4 08/07/2022 1024   HCT 39.8 10/10/2023 1017   HCT 34.5 08/07/2022 1024   PLT 402.0 (H) 10/10/2023 1017   PLT 421 08/07/2022 1024   MCV 79.1 10/10/2023 1017   MCV 83 08/07/2022 1024   MCH 25.9 (L) 09/12/2023 0935   MCHC 32.5 10/10/2023 1017   RDW 15.5 10/10/2023 1017   RDW 14.3 08/07/2022 1024   LYMPHSABS 2.5 10/10/2023 1017   LYMPHSABS 2.0 08/07/2022 1024   MONOABS 0.9 10/10/2023 1017   EOSABS 0.2 10/10/2023 1017   EOSABS 0.2 08/07/2022 1024   BASOSABS 0.1 10/10/2023 1017   BASOSABS 0.0 08/07/2022 1024    BMET    Component Value Date/Time   NA 140 10/10/2023 1017   NA 142 08/07/2022 1024   K 4.4 10/10/2023 1017   CL 99 10/10/2023 1017   CO2 28 10/10/2023 1017   GLUCOSE 117 (H) 10/10/2023 1017   BUN 15 10/10/2023 1017   BUN 12 08/07/2022 1024   CREATININE 1.61 (H) 10/10/2023 1017   CREATININE 0.88 09/15/2013 1032   CALCIUM  9.8 10/10/2023 1017   GFRNONAA >60 09/12/2023 0935   GFRAA 67 02/02/2020 0933    BNP    Component Value Date/Time   BNP 34.3 09/12/2023 0935    ProBNP    Component Value Date/Time   PROBNP <36 10/10/2023 1017    Imaging: No results found.        Latest Ref Rng & Units 08/22/2023    9:14 AM 02/09/2018    8:23 AM  PFT Results  FVC-Pre L 2.52  2.28   FVC-Predicted Pre % 57  60   FVC-Post L 2.10  2.13   FVC-Predicted Post % 47  56   Pre FEV1/FVC % % 72  76   Post FEV1/FCV % % 73  78   FEV1-Pre L 1.82  1.74   FEV1-Predicted Pre % 53  57   FEV1-Post L 1.54  1.67   DLCO uncorrected  ml/min/mmHg 13.72  13.67   DLCO UNC% % 52  40   DLCO corrected ml/min/mmHg 13.72    DLCO COR %Predicted % 52    DLVA Predicted % 77  60   TLC L 5.40  4.55   TLC % Predicted % 88  74   RV % Predicted % 121  112     No results found for: NITRICOXIDE      No data to display              Assessment & Plan:   Assessment and Plan Assessment & Plan Right middle lobe pneumonia  -significant symptom burden Chest x-ray shows right middle lobe opacity consistent with pneumonia and a persistent cough since August post-COVID infection. Extend Augmentin  for an additional 7 days, totaling 14 days of therapy. Administered Depo-Medrol  120 mg IM injection and initiated prednisone  taper starting tomorrow. Ordered blood work including D-dimer. Continue oxygen  therapy at 5 liters to maintain oxygen  levels between 88-90%.  Chronic obstructive pulmonary disease with acute exacerbation   COPD exacerbation with persistent cough and oxygen  requirement. She  is currently on Breztri  and albuterol  nebulizer. Administered albuterol  nebulizer treatment in the office. Continue Breztri  and albuterol  nebulizer as prescribed.  Prednisone  taper over the next week Depo-Medrol  injection was given today.  She is to start prednisone  tomorrow.  Advise if symptoms or not improving will need to come back sooner or seek emergency room care.  Lab work with D-dimer is pending if positive most likely will need to do a VQ scan as kidney function will not allow for contrasted CT.   Chronic cough   Persistent cough since August post-COVID infection. Current medications include Breztri  and albuterol  nebulizer, but cough not relieved. Prescribed Tessalon  Perles and Hydromet  cough syrup for severe coughing. Advised against using cough syrup with other sedating medications or narcotics.  Continue GERD and chronic rhinitis trigger maintenance regimen.  Oral candidiasis (yeast infection) acute Complaints of yeast infection.  Prescribed Diflucan  150 mg tablet to be taken once.  Chronic kidney disease  -worsening Fluctuating kidney function. Kidney function will be monitored with upcoming labs. Ordered blood work to assess kidney function.  Type 2 diabetes mellitus  -under control with recent A1c at 5.5 Type 2 diabetes managed with Ozempic , but currently not taking it due to abdominal issues and cirrhosis.  Advised of elevated blood sugars on steroids.  Continue to monitor closely.   Sleep apnea, history of  -remains symptomatic History of sleep apnea with recent sleep study completed, results pending. Not currently on CPAP. Await sleep study results to determine if CPAP is indicated.  Morbid obesity-increasing weight Continue on healthy diet and weight loss program   Plan  Patient Instructions  Extend  Augmentin  875mg  Twice daily  for 1 week Prednisone  taper over next week.  Delsym 2 tsp Twice daily  As needed  cough  Tessalon  Three times a day  for cough As needed   Hydromet 1 tsp At bedtime for severe cough  (do not use with pain medications , causes sedation, use with caution)  Continue on Breztri  2 puffs Twice daily rinse after use.  Albuterol  inhaler or neb As needed   Continue on Oxygen  5l/m with activity.  Labs today.  After labs decide on CT scan.  Follow up in 2-3 week in person for evaluation and As needed   Please contact office for sooner follow up if symptoms do not improve or worsen or seek emergency care        Camika Marsico, NP 12/04/2023  I spent 45  minutes dedicated to the care of this patient on the date of this encounter to include pre-visit review of records, face-to-face time with the patient discussing conditions above, post visit ordering of testing, clinical documentation with the electronic health record, making appropriate referrals as documented, and communicating necessary findings to members of the patients care team.

## 2023-12-04 NOTE — Progress Notes (Signed)
 I called and spoke with patient,provided results/recommendations per Madelin Stank NP.  She verbalized understanding.  Scheduled her for f/u on 11/24 at 1 pm.  Nothing further needed.

## 2023-12-04 NOTE — Addendum Note (Signed)
 Addended byBETHA JESSICA BOUCHARD S on: 12/04/2023 01:07 PM   Modules accepted: Orders

## 2023-12-04 NOTE — Patient Instructions (Addendum)
 Extend  Augmentin  875mg  Twice daily  for 1 week Prednisone  taper over next week.  Delsym 2 tsp Twice daily  As needed  cough  Tessalon  Three times a day  for cough As needed   Hydromet 1 tsp At bedtime for severe cough  (do not use with pain medications , causes sedation, use with caution)  Continue on Breztri  2 puffs Twice daily rinse after use.  Albuterol  inhaler or neb As needed   Continue on Oxygen  5l/m with activity.  Labs today.  After labs decide on CT scan.  Follow up in 2-3 week in person for evaluation and As needed   Please contact office for sooner follow up if symptoms do not improve or worsen or seek emergency care

## 2023-12-05 ENCOUNTER — Telehealth: Payer: Self-pay

## 2023-12-05 ENCOUNTER — Encounter: Payer: Self-pay | Admitting: Hematology and Oncology

## 2023-12-05 ENCOUNTER — Telehealth (HOSPITAL_COMMUNITY): Admitting: Psychiatry

## 2023-12-05 ENCOUNTER — Ambulatory Visit: Payer: Self-pay | Admitting: Pulmonary Disease

## 2023-12-05 ENCOUNTER — Other Ambulatory Visit (HOSPITAL_COMMUNITY): Payer: Self-pay

## 2023-12-05 NOTE — Telephone Encounter (Signed)
 PA needed for tessalon 

## 2023-12-05 NOTE — Telephone Encounter (Signed)
 Patient informed me the pa is needed for the hydromet cough syrup

## 2023-12-05 NOTE — Telephone Encounter (Signed)
 Copied from CRM #[id number]. Topic: Clinical - Prescription Issue >> Dec 05, 2023 10:36 AM Rilla NOVAK wrote: Reason for CRM: Patient calling in to state she needs prior auth for the cough medicine. Patient states she really needs to the med so she don't cough all weekend. Please call patient [phone].

## 2023-12-05 NOTE — Telephone Encounter (Signed)
 FYI Only or Action Required?: Action required by provider: update on patient condition.  Patient is followed in Pulmonology for COPD, last seen on 12/04/2023 by Parrett, Madelin RAMAN, NP.  Called Nurse Triage reporting Medication Problem.  Triage Disposition: Call PCP Now  Patient/caregiver understands and will follow disposition?: Yes      Copied from CRM [id number]. Topic: Clinical - Red Word Triage >> Dec 05, 2023  5:00 PM Joesph PARAS wrote: Red Word that prompted transfer to Nurse Triage: Patient is having severe coughing and distinct inability to breathe audible over the phone.     Reason for Disposition  [1] Caller has URGENT medicine question about med that primary care doctor (or NP/PA) or specialist prescribed AND [2] triager unable to answer question  Answer Assessment - Initial Assessment Questions Patient calling again about her cough suppressants. Patient was advised of discount prescription cards and coupons available online. Patient continuously coughing throughout the call. ED and urgent care precautions discussed. Patient verbalized understanding and ended the call.  Protocols used: Medication Question Call-A-AH

## 2023-12-08 ENCOUNTER — Telehealth (HOSPITAL_BASED_OUTPATIENT_CLINIC_OR_DEPARTMENT_OTHER): Admitting: Psychiatry

## 2023-12-08 ENCOUNTER — Encounter (HOSPITAL_COMMUNITY): Payer: Self-pay | Admitting: Psychiatry

## 2023-12-08 VITALS — Wt 352.0 lb

## 2023-12-08 DIAGNOSIS — F411 Generalized anxiety disorder: Secondary | ICD-10-CM | POA: Diagnosis not present

## 2023-12-08 DIAGNOSIS — F4312 Post-traumatic stress disorder, chronic: Secondary | ICD-10-CM

## 2023-12-08 DIAGNOSIS — F331 Major depressive disorder, recurrent, moderate: Secondary | ICD-10-CM

## 2023-12-08 MED ORDER — LAMOTRIGINE 100 MG PO TABS: ORAL_TABLET | ORAL | 2 refills | Status: DC

## 2023-12-08 MED ORDER — DOXEPIN HCL 25 MG PO CAPS: 25.0000 mg | ORAL_CAPSULE | Freq: Every day | ORAL | 2 refills | Status: DC

## 2023-12-08 MED ORDER — REXULTI 3 MG PO TABS: 3.0000 mg | ORAL_TABLET | Freq: Every day | ORAL | 2 refills | Status: AC
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
## 2023-12-08 NOTE — Telephone Encounter (Signed)
 Copied from CRM [id number]. Topic: Clinical - Medication Question >> Dec 08, 2023  8:47 AM Isabell A wrote: Reason for CRM: Patient returning phone call, states yes she was able to use her discount card.

## 2023-12-08 NOTE — Telephone Encounter (Signed)
?   Was she able to use discount card for the cough syrup- called her and there was no answer- LMTCB.

## 2023-12-09 NOTE — Telephone Encounter (Signed)
 Patient used discount card everything in order nothing else needed,patient has sleep study down on 11/4 needs to be resulted for appointment,will send to Dr.O to review

## 2023-12-11 ENCOUNTER — Ambulatory Visit (INDEPENDENT_AMBULATORY_CARE_PROVIDER_SITE_OTHER): Admitting: Licensed Clinical Social Worker

## 2023-12-11 DIAGNOSIS — F410 Panic disorder [episodic paroxysmal anxiety] without agoraphobia: Secondary | ICD-10-CM | POA: Diagnosis not present

## 2023-12-11 DIAGNOSIS — F4312 Post-traumatic stress disorder, chronic: Secondary | ICD-10-CM

## 2023-12-11 DIAGNOSIS — F411 Generalized anxiety disorder: Secondary | ICD-10-CM | POA: Diagnosis not present

## 2023-12-11 DIAGNOSIS — F331 Major depressive disorder, recurrent, moderate: Secondary | ICD-10-CM | POA: Diagnosis not present

## 2023-12-11 NOTE — Progress Notes (Signed)
 More than 50% of call was by telephone due to technological difficulties  Virtual Visit via Video Note  I connected with Lindsay [name] on 12/11/23 at  8:00 AM EST by a video enabled telemedicine application and verified that I am speaking with the correct person using two identifiers.  Location: Patient: home  Provider: home office   I discussed the limitations of evaluation and management by telemedicine and the availability of in person appointments. The patient expressed understanding and agreed to proceed.   I discussed the assessment and treatment plan with the patient. The patient was provided an opportunity to ask questions and all were answered. The patient agreed with the plan and demonstrated an understanding of the instructions.   The patient was advised to call back or seek an in-person evaluation if the symptoms worsen or if the condition fails to improve as anticipated.  I provided 30 minutes of non-face-to-face time during this encounter.  THERAPIST PROGRESS NOTE  Session Time: 8:00 AM to 8:30 AM  Participation Level: Active  Behavioral Response: CasualAlertAnxious  Type of Therapy: Individual Therapy  Treatment Goals addressed: Patient reports therapy helps cope with her stressors continue to work on coping strategies to help improve symptoms of her diagnoses, coping in general  ProgressTowards Goals: Progressing-therapist assesses patient uses therapy as an important component of coping dealing with pneumonia charges against her daughter processed thoughts and feelings in session engaged in supportive interventions as well as problem solving  Interventions: Solution Focused, Strength-based, and Other: coping  Summary: Lindsay [name] is a 53 y.o. female who presents with feel like crap. Has pneumonia. Went to pulmonary doctor suspected it and later when home confirmed. Therapist asked what is the treatment? She gave her antibiotic, steroids, two different medicines.  Sleeping it off. Patient wanted to share something confidential on October 31 daughter picked up embezzlement charge picked up from work. People getting arrested. Other people taking money doesn't know if she is guilty. Patient says shouldn't have signed paper they gave her and she told her daughter that that they weren't law enforcement don't just do what a person says to do. Daughter says was intimidated in the situation. Patient worried about where the money is going to come from and who is going to be attorney. She is not  in jail now. She had been back to 3 weeks had surgery.  Could be she is being falsely charged  doesn't know what to do don't know what they are saying. Told her don't talk to people at job. 25th preliminary hearing. Knows they will ask basic questions like who is her representation. Both therapist and patient agree that attorney will figure it out. Her attorney had retired. This was the attorney who told cops when came to their house don't come here for stupid crap. This attorney knew everyone.  The attorney was able to recommend use the man who works with him.  Therapist glad that she has a referral like that that her former attorney recommends.  The attorney who retired had helped her with other matters earlier on. Therapist pointed out that she needs someone to look evidence and stop this from moving forward if there is nothing there. Cousin said if she did something not something in the amount of 1000's of dollars wasn't there long enough.  Therapist asked if she could do something like this and patient does not know she has been with boyfriend Mom and doing stupid stuff been there a long time rarely been there  with her.  Patient stressed about a lot of stuff and therapist knows she is based on her sessions and what she is already dealing with.  Therapist went on to say though if she did it she has to deal with consequences. Patient said if she did it doesn't want her locked up. Therapist  noted needs to find what piece of paper signed. She doesn't know and therapist noted attorney will need to ask for that. Therapist also feels like a good chance she didn't do. Not equal power dynamic easier to be taken advantage of. Patient reiterated  If consequences don't want her to go to jail. Therapist feedback was that easy in work environment to target someone need someone, an attorney, who can find out what is going on can look into. Patient said If did it and there are consequences if did or not still mom.  Therapist validating wanting to be supportive with patient dealing with extra stress processed thoughts and feelings in session both to help with coping also for patient to be cognizant of therapist as a support.  Suicidal/Homicidal: No  Plan: Return again in 1 week.2.  Provide supportive interventions help patient with coping as amount of stressors has now increased for patient  Diagnosis: Major depressive disorder, recurrent, moderate, generalized anxiety disorder, chronic PTSD, panic attacks   Collaboration of Care: Other none needed  Patient/Guardian was advised Release of Information must be obtained prior to any record release in order to collaborate their care with an outside provider. Patient/Guardian was advised if they have not already done so to contact the registration department to sign all necessary forms in order for us  to release information regarding their care.   Consent: Patient/Guardian gives verbal consent for treatment and assignment of benefits for services provided during this visit. Patient/Guardian expressed understanding and agreed to proceed.   Ronal Sink, LCSW 12/11/2023

## 2023-12-12 ENCOUNTER — Ambulatory Visit (HOSPITAL_COMMUNITY)

## 2023-12-14 ENCOUNTER — Telehealth: Payer: Self-pay | Admitting: Pulmonary Disease

## 2023-12-14 DIAGNOSIS — G4730 Sleep apnea, unspecified: Secondary | ICD-10-CM

## 2023-12-14 NOTE — Procedures (Signed)
 [facility] [address] [place], [place] [address] Tel: [phone]   Fax: [phone]  Polysomnography Interpretation  Patient Name:  STARSHA, MORNING Date:  12/02/2023 Referring Physician:  JENNET EPLEY [phone]) %%startinterp%% Indications for Polysomnography The patient is a 53 year old Female who is 5' 11 and weighs 340.0 lbs. Her BMI equals 47.6.  A full night polysomnogram was performed to evaluate for -.  Patient reported taking her medications at 9:10 pm.  Extra strength Tylenol   Tizanidine   Sumatriptan   Oxycodone -acetaminophen   Zofran -ODT   Polysomnogram Data A full night polysomnogram recorded the standard physiologic parameters including EEG, EOG, EMG, EKG, nasal and oral airflow.  Respiratory parameters of chest and abdominal movements were recorded with Respiratory Inductance Plethysmography belts.  Oxygen  saturation was recorded by pulse oximetry.   Sleep Architecture The total recording time of the polysomnogram was 396.8 minutes.  The total sleep time was 342.5 minutes.  The patient spent 3.6% of total sleep time in Stage N1, 85.3% in Stage N2, 0.0% in Stages N3, and 11.1% in REM.  Sleep latency was 17.9 minutes.  REM latency was 61.0 minutes.  Sleep Efficiency was 86.3%.  Wake after Sleep Onset time was 36.0 minutes.  Respiratory Events The polysomnogram revealed a presence of 1 obstructive, - central, and - mixed apneas resulting in an Apnea index of 0.2 events per hour.  There were 17 hypopneas (>=3% desaturation and/or arousal) resulting in an Apnea\Hypopnea Index (AHI >=3% desaturation and/or arousal) of 3.2 events per hour.  There were 11 hypopneas (>=4% desaturation) resulting in an Apnea\Hypopnea Index (AHI >=4% desaturation) of 2.1 events per hour.  There were 30 Respiratory Effort Related Arousals resulting in a RERA index of 5.3 events per hour. The Respiratory Disturbance Index is 8.4 events per hour.  The snore  index was - events per hour.  Mean oxygen  saturation was 97.4%.  The lowest oxygen  saturation during sleep was 78.0%.  Time spent <=88% oxygen  saturation was 1.9 minutes (0.5%).  Limb Activity There were - total limb movements recorded, of this total, - were classified as PLMs.  PLM index was - per hour and PLM associated with Arousals index was - per hour.  Cardiac Summary The average pulse rate was 74.1 bpm.  The minimum pulse rate was 62.0 bpm while the maximum pulse rate was 92.0 bpm.  Cardiac rhythm was normal/abnormal.  Comments: Patient did not meet threshold for a split-night study, study was completed as a diagnostic study on patient's usual oxygen  supplementation at 3 L  Diagnosis:  Negative study for significant sleep disordered breathing Study was performed on patient's usual oxygen  supplementation at 3 L AHI of 3.2 with oxygen  nadir of 75%.  Saturations below 88% for 1.9 minutes Sleep efficiency was good with fair sleep architecture No significant periodic limb movement Cardiac rhythm was sinus  Recommendations: Continue oxygen  supplementation at 3 L  Clinical follow-up of symptoms  Optimize sleep hygiene   This study was personally reviewed and electronically signed by: Dr. Jennet Epley Accredited Board Certified in Sleep Medicine Date/Time:   12/14/23 %%endinterp%%   Diagnostic PSG Report  Patient Name: MIRCA, YALE Study Date: 12/02/2023  Date of Birth: 05/02/1970 Study Type: Diagnostic  Age: 12 year MRN #: [id number]  Sex: Female Interpreting Physician: EPLEY JENNET, [id number]  Height: 5' 11 Referring Physician: JENNET EPLEY [phone])  Weight: 340.0 lbs Recording Tech: Orie Sires RRT RPSGT RST  BMI: 47.6 Scoring Tech: Orie Sires RRT RPSGT RST  ESS: 10 Neck  Size: 17.5  Mask Type [employer] FFM Simplus    Mask Size: Medium Supplemental O2: 3 LPM   Study Overview  Lights Off: 10:48:44 PM  Count Index  Lights On: 05:25:30 AM  Awakenings: 23 4.0  Time in Bed: 396.8 min. Arousals: 57 10.0  Total Sleep Time: 342.5 min. AHI (>=3% Desat and/or Ar.): 18 3.2   Sleep Efficiency: 86.3% AHI (>=4% Desat): 12 2.1   Sleep Latency: 17.9 min. Limb Movements: - -  Wake After Sleep Onset: 36.0 min. Snore: - -  REM Latency from Sleep Onset: 61.0 min. Desaturations: 16 2.8     Minimum SpO2 TST: 78.0%    Sleep Architecture  % of Time in Bed Stages Time (mins) % Sleep Time  Wake 54.5   Stage N1 12.5 3.6%  Stage N2 292.0 85.3%  Stage N3 0.0 0.0%  REM 38.0 11.1%   Arousal Summary   NREM REM Sleep Index  Respiratory Arousals 11 24 35 6.1  PLM Arousals - - - -  Isolated Limb Movement Arousals - - - -  Snore Arousals - - - -  Spontaneous Arousals 21 1 22  3.9  Total 32 25 57 10.0   Limb Movement Summary   Count Index  Isolated Limb Movements - -  Periodic Limb Movements (PLMs) - -  Total Limb Movements - -    Respiratory Summary   By Sleep Stage By Body Position Total   NREM REM Supine Non-Supine   Time (min) 304.5 38.0 238.5 104.0 342.5         Obstructive Apnea - 1 - 1 1  Mixed Apnea - - - - -  Central Apnea - - - - -  Total Apneas - 1 - 1 1  Total Apnea Index - 1.6 - 0.6 0.2         Hypopneas (>=3% Desat and/or Ar.) 5 12 13 4 17   AHI (>=3% Desat and/or Ar.) 1.0 20.5 3.3 2.9 3.2         Hypopneas (>=4% Desat) 2 9 9 2 11   AHI (>=4% Desat) 0.4 15.8 2.3 1.7 2.1          RERAs 13 17 15 15 30   RERA Index 2.6 26.8 3.8 8.7 5.3         RDI 3.5 47.4 7.0 11.5 8.4    Respiratory Event Type Index  Central Apneas -  Obstructive Apneas 0.2  Mixed Apneas -  Central Hypopneas -  Obstructive Hypopneas 3.0  Central Apnea + Hypopnea (CAHI) -  Obstructive Apnea + Hypopnea (OAHI) 3.2   Respiratory Event Durations   Apnea Hypopnea   NREM REM NREM REM  Average (seconds) - 22.2 37.0 45.0  Maximum (seconds) - 22.2 73.9 88.0    Oxygen  Saturation Summary   Wake NREM REM TST TIB  Average SpO2 (%) 97.0% 97.3%  98.1% 97.4% 97.4%  Minimum SpO2 (%) 75.0% 78.0% 81.0% 78.0% 75.0%  Maximum SpO2 (%) 100.0% 100.0% 100.0% 100.0% 100.0%   Oxygen  Saturation Distribution  Range (%) Time in range (min) Time in range (%)  90.0 - 100.0 392.1 99.3%  80.0 - 90.0 2.3 0.6%  70.0 - 80.0 0.3 0.1%  60.0 - 70.0 - -  50.0 - 60.0 - -  0.0 - 50.0 - -  Time Spent <=88% SpO2  Range (%) Time in range (min) Time in range (%)  0.0 - 88.0 1.9 0.5%      Count Index  Desaturations 16 2.8  Cardiac Summary   Wake NREM REM Sleep Total  Average Pulse Rate (BPM) 79.0 73.6 72.2 73.4 74.1  Minimum Pulse Rate (BPM) 64.0 64.0 62.0 62.0 62.0  Maximum Pulse Rate (BPM) 92.0 89.0 81.0 89.0 92.0   Pulse Rate Distribution:  Range (bpm) Time in range (min) Time in range (%)  0.0 - 40.0 - -  40.0 - 60.0 - -  60.0 - 80.0 362.9 91.8%  80.0 - 100.0 25.0 6.3%  100.0 - 120.0 - -  120.0 - 140.0 - -  140.0 - 200.0 - -   Supplemental O2 Summary  O2 Level Time (min) Wake (min) NREM (min) REM (min) Supine TST (min) Sleep Eff% OA# CA# MA# Hyp# (>=3%) AHI (>=3%) Hyp# (>=4%) AHI (>=%4) RERA RDI SpO2 <=88% (min) Min SpO2 Mean SpO2 Ar. Index  O2: 3 397.0 54.5 304.5 38.0  238.5 86.3% 1 - - 17 3.2 11  2.1 30  8.4  1.5 78.0 97.4 10.0   Hypnograms                      Technologist Comments  Patient was ordered as a split night study. Patient is a 53 year old African American female who was sent to the sleep center for OSA. Patient did not meet split night criteria per standing protocol. Patient did not have sufficient respiratory events to warrant CPAP trial per standing protocol. Patient arrived at sleep center on oxygen  via nasal cannula at 3 LPM. Patient stated that she wears oxygen  24/7 at 3-5 LPM. Thus, per lab protocol patient study was done with patient on oxygen  at 3 LPM. Patient reported taking her medications at 9:10 pm. A few PLMA's/PLMA's were noted. Questionable cardiac arrhythmias were noted see  epochs for examples:43, 183, 185, 249, 416, etc. No restroom visit was noted. Study was performed in room # 4. Mild to loud snoring was noted during the night. Patient was fitted with a Fisher [employer]kel Simplus nasal/oral full-face mask size medium.

## 2023-12-14 NOTE — Telephone Encounter (Signed)
 Call patient  Sleep study result  Date of study: 12/02/2023  Impression: Negative study for significant sleep disordered breathing, study was performed on patient's usual oxygen  supplementation at 3 L AHI of 3.2 with O2 nadir of 75%  Recommendation:  Continue oxygen  supplementation at 3 L Clinical follow-up of symptoms Optimize sleep hygiene  Encourage weight loss measures

## 2023-12-14 NOTE — Procedures (Signed)
  Indications for Polysomnography The patient is a 53 year old Female who is 5' 11 and weighs 340.0 lbs. Her BMI equals 47.6.  A full night polysomnogram was performed to evaluate for -.  Patient reported taking her medications at 9:10 pm.Extra strength TylenolTizanidineSumatriptanOxycodone-acetaminophenZofran-ODT Polysomnogram Data A full night polysomnogram recorded the standard physiologic parameters including EEG, EOG, EMG, EKG, nasal and oral airflow.  Respiratory parameters of chest and abdominal movements were recorded with Respiratory Inductance Plethysmography belts.   Oxygen  saturation was recorded by pulse oximetry.  Sleep Architecture The total recording time of the polysomnogram was 396.8 minutes.  The total sleep time was 342.5 minutes.  The patient spent 3.6% of total sleep time in Stage N1, 85.3% in Stage N2, 0.0% in Stages N3, and 11.1% in REM.  Sleep latency was 17.9 minutes.   REM latency was 61.0 minutes.  Sleep Efficiency was 86.3%.  Wake after Sleep Onset time was 36.0 minutes.  Respiratory Events The polysomnogram revealed a presence of 1 obstructive, - central, and - mixed apneas resulting in an Apnea index of 0.2 events per hour.  There were 17 hypopneas (GreaterEqual to3% desaturation and/or arousal) resulting in an Apnea\Hypopnea Index (AHI  GreaterEqual to3% desaturation and/or arousal) of 3.2 events per hour.  There were 11 hypopneas (GreaterEqual to4% desaturation) resulting in an Apnea\Hypopnea Index (AHI GreaterEqual to4% desaturation) of 2.1 events per hour.  There were 30 Respiratory  Effort Related Arousals resulting in a RERA index of 5.3 events per hour. The Respiratory Disturbance Index is 8.4 events per hour.  The snore index was - events per hour.  Mean oxygen  saturation was 97.4%.  The lowest oxygen  saturation during sleep was 78.0%.  Time spent LessEqual to88% oxygen  saturation was  minutes ().  Limb Activity There were - total limb movements recorded,  of this total, - were classified as PLMs.  PLM index was - per hour and PLM associated with Arousals index was - per hour.  Cardiac Summary The average pulse rate was 74.1 bpm.  The minimum pulse rate was 62.0 bpm while the maximum pulse rate was 92.0 bpm.  Cardiac rhythm was normal/abnormal.  Comments:  Diagnosis:  Recommendations:   This study was personally reviewed and electronically signed by: Dr. Jennet Epley Accredited Board Certified in Sleep Medicine Date/Time:

## 2023-12-15 NOTE — Telephone Encounter (Signed)
 Called patient to inform of results,patient states she used 5L while sleeping,wants to know if this means that she cannot get a cpap machine,states the 3L does nothing for her and needs to be on 5L.,please advise for further clarification

## 2023-12-16 ENCOUNTER — Ambulatory Visit: Admitting: Adult Health

## 2023-12-17 ENCOUNTER — Ambulatory Visit (INDEPENDENT_AMBULATORY_CARE_PROVIDER_SITE_OTHER): Admitting: Licensed Clinical Social Worker

## 2023-12-17 DIAGNOSIS — F4312 Post-traumatic stress disorder, chronic: Secondary | ICD-10-CM | POA: Diagnosis not present

## 2023-12-17 DIAGNOSIS — F331 Major depressive disorder, recurrent, moderate: Secondary | ICD-10-CM

## 2023-12-17 DIAGNOSIS — F411 Generalized anxiety disorder: Secondary | ICD-10-CM

## 2023-12-17 DIAGNOSIS — F410 Panic disorder [episodic paroxysmal anxiety] without agoraphobia: Secondary | ICD-10-CM

## 2023-12-17 NOTE — Progress Notes (Signed)
 Difficulty with technology so switched to phone more than 50% of session done by phone  Virtual Visit via Video Note  I connected with Lindsay [name] on 12/17/23 at  8:00 AM EST by a video enabled telemedicine application and verified that I am speaking with the correct person using two identifiers.  Location: Patient: home Provider: home office   I discussed the limitations of evaluation and management by telemedicine and the availability of in person appointments. The patient expressed understanding and agreed to proceed.   I discussed the assessment and treatment plan with the patient. The patient was provided an opportunity to ask questions and all were answered. The patient agreed with the plan and demonstrated an understanding of the instructions.   The patient was advised to call back or seek an in-person evaluation if the symptoms worsen or if the condition fails to improve as anticipated.  I provided 45 minutes of non-face-to-face time during this encounter.  THERAPIST PROGRESS NOTE  Session Time: 8:00 AM to 8:45 AM  Participation Level: Active  Behavioral Response: CasualAlertAnxious  Type of Therapy: Individual Therapy  Treatment Goals addressed: Patient reports therapy helps cope with her stressors continue to work on coping strategies to help improve symptoms of her diagnoses, coping in general  ProgressTowards Goals: Progressing-reviewed treatment goals virtually patient gave consent to complete via video reviewed progress patient was a lot more anxious before session sessions helped her to decrease anxiety therapist assesses helpful for patient to have somebody following medical caregiver support think about the next step as well as processing thoughts and feelings to help with coping  Interventions: Solution Focused, Strength-based, Supportive, and Other: coping  Summary: Lindsay [name] is a 53 y.o. female who presents with giving therapist update.  Therapist first  complemented patient how gracious she is about going part-time and wanted to let patient know a sign of empathetic person not everybody reacting is positively wanted patient to know these positive qualities about herself.   Patient says don't want to go to emergency room keep putting off.  New prescription can't afford $1-$2 a pill would be $60 still coughing. CT scan coming up 21th. Pulmonologist said keep doing what you are doing. What does that mean stay on oxygen  but can't breath.  If keep doing what she is doing nothing is getting better needs some type of treatment intervention. Not sure if getting cpap.  The person who called was office worker did not have that kind of clinical information patient will follow-up with supposed to hear back and has not heard back.  Plan is to have CT scan see if something on lung. Said pneumonia on right lung. Hasn't gone away. Oxygen  helping but patient herself has to help get her  breath along with oxygen . Told her to turn up and didn't help.  One of the doctors always goes to her being heavy and patient said has been heavy never felt like this before. When N.P. prescribed meds cleared helped when done with meds over went back to coughing and nose running. The medicine can't afford is Tessalon .  When therapy is here is that think something wrong with the medical system when patient cannot get meds to help feel better. Emergency doesn't do much. N.P. if meds don't help go to emergency room.  Patient noted they do not do much as well as transportation is difficult for her so holding off for emergency room.  Patient did say nurse practitioner has been more attentive to her during with  patient's symptoms. With pulmonologist why can't  medical professional call? Asked about CPAP got no answer just to keep doing what doing and patient says can't breath. Patient said plan is to go get CT go from there and go to medical doctors appointments for her meds. Also follow up about CPAP  machine.  Lindsay [name]'s court date is 25th next week. Patient needs to call attorneys. Patient will  go with her to the hearing she is going to say select a arts administrator while patient looking for other attorney. Patient referred to [name] her attorney who is retired recommended him.  Patient talked about this attorney things he did for other people helped other people.   Patient talked about helping her and she looked at it like got a second chance working with him how he helped Therapist noted if she said second change not a small thing. His name is [name] call [name].(Therapist laughed like the comedian)  We talked about daily life patient lays she ways she is working on developing a better relationship with God lay there or praying. Less and less watching television watch movies over the weekend. Look at phone. Audible might do original. She wants one where they do all the different voices all that.  Therapist noted reading has really helped her so encouraging patient with that one of the biggest coping strategies for therapist gets a lot out of it think patient might as well. Reviewed session and how it helped patient said panicky before session does not feel that way now sessions worked better than a pill for panic attack and therapist very appreciated that feedback about her sessions.  Patient says somebody she feels can talk to and that is important in terms of therapeutic invention interventions that can be effective.    Suicidal/Homicidal: No  Plan: Return again in 2 weeks.2.Provide supportive interventions help patient with coping as amount of stressors has now increased for patient, helps to process thoughts and feelings in session helps with coping  Diagnosis: Major depressive disorder, recurrent, moderate, generalized anxiety disorder, chronic PTSD, panic attacks   Collaboration of Care: Other none needed  Patient/Guardian was advised Release of Information must be obtained  prior to any record release in order to collaborate their care with an outside provider. Patient/Guardian was advised if they have not already done so to contact the registration department to sign all necessary forms in order for us  to release information regarding their care.   Consent: Patient/Guardian gives verbal consent for treatment and assignment of benefits for services provided during this visit. Patient/Guardian expressed understanding and agreed to proceed.   Ronal Sink, LCSW 12/17/2023

## 2023-12-18 ENCOUNTER — Ambulatory Visit (HOSPITAL_COMMUNITY): Admitting: Licensed Clinical Social Worker

## 2023-12-18 ENCOUNTER — Other Ambulatory Visit: Payer: Self-pay | Admitting: Physician Assistant

## 2023-12-19 ENCOUNTER — Telehealth: Payer: Self-pay | Admitting: Pulmonary Disease

## 2023-12-19 ENCOUNTER — Ambulatory Visit (HOSPITAL_COMMUNITY)
Admission: RE | Admit: 2023-12-19 | Discharge: 2023-12-19 | Disposition: A | Discharge status: Home / Self Care | Source: Ambulatory Visit | Attending: Adult Health | Admitting: Adult Health

## 2023-12-19 ENCOUNTER — Encounter (HOSPITAL_COMMUNITY): Payer: Self-pay

## 2023-12-19 ENCOUNTER — Other Ambulatory Visit: Payer: Self-pay

## 2023-12-19 ENCOUNTER — Emergency Department (HOSPITAL_COMMUNITY)

## 2023-12-19 ENCOUNTER — Telehealth: Payer: Self-pay | Admitting: Adult Health

## 2023-12-19 ENCOUNTER — Emergency Department (HOSPITAL_COMMUNITY)
Admission: EM | Admit: 2023-12-19 | Discharge: 2023-12-19 | Disposition: A | Discharge status: Home / Self Care | Attending: Emergency Medicine | Admitting: Emergency Medicine

## 2023-12-19 DIAGNOSIS — Z8616 Personal history of COVID-19: Secondary | ICD-10-CM | POA: Diagnosis not present

## 2023-12-19 DIAGNOSIS — R053 Chronic cough: Secondary | ICD-10-CM | POA: Insufficient documentation

## 2023-12-19 DIAGNOSIS — R058 Other specified cough: Secondary | ICD-10-CM | POA: Diagnosis present

## 2023-12-19 DIAGNOSIS — J441 Chronic obstructive pulmonary disease with (acute) exacerbation: Secondary | ICD-10-CM | POA: Diagnosis present

## 2023-12-19 LAB — CBC WITH DIFFERENTIAL/PLATELET
Abs Immature Granulocytes: 0.09 K/uL — ABNORMAL HIGH (ref 0.00–0.07)
Basophils Absolute: 0.1 K/uL (ref 0.0–0.1)
Basophils Relative: 1 %
Eosinophils Absolute: 0.2 K/uL (ref 0.0–0.5)
Eosinophils Relative: 1 %
HCT: 43.2 % (ref 36.0–46.0)
Hemoglobin: 13.3 g/dL (ref 12.0–15.0)
Immature Granulocytes: 1 %
Lymphocytes Relative: 28 %
Lymphs Abs: 3.4 K/uL (ref 0.7–4.0)
MCH: 25.8 pg — ABNORMAL LOW (ref 26.0–34.0)
MCHC: 30.8 g/dL (ref 30.0–36.0)
MCV: 83.9 fL (ref 80.0–100.0)
Monocytes Absolute: 1 K/uL (ref 0.1–1.0)
Monocytes Relative: 9 %
Neutro Abs: 7.3 K/uL (ref 1.7–7.7)
Neutrophils Relative %: 60 %
Platelets: 397 K/uL (ref 150–400)
RBC: 5.15 MIL/uL — ABNORMAL HIGH (ref 3.87–5.11)
RDW: 17 % — ABNORMAL HIGH (ref 11.5–15.5)
WBC: 12 K/uL — ABNORMAL HIGH (ref 4.0–10.5)
nRBC: 0 % (ref 0.0–0.2)

## 2023-12-19 LAB — BASIC METABOLIC PANEL WITH GFR
Anion gap: 12 (ref 5–15)
BUN: 8 mg/dL (ref 6–20)
CO2: 23 mmol/L (ref 22–32)
Calcium: 8.7 mg/dL — ABNORMAL LOW (ref 8.9–10.3)
Chloride: 103 mmol/L (ref 98–111)
Creatinine, Ser: 0.93 mg/dL (ref 0.44–1.00)
GFR, Estimated: >60 mL/min (ref >60)
Glucose, Bld: 85 mg/dL (ref 70–99)
Potassium: 4.1 mmol/L (ref 3.5–5.1)
Sodium: 138 mmol/L (ref 135–145)

## 2023-12-19 MED ORDER — IOHEXOL 350 MG/ML SOLN
75.0000 mL | Freq: Once | INTRAVENOUS | Status: AC | PRN
  Administered 2023-12-19: 75 mL via INTRAVENOUS

## 2023-12-19 MED ORDER — OXYCODONE-ACETAMINOPHEN 5-325 MG PO TABS
2.0000 | ORAL_TABLET | Freq: Once | ORAL | Status: AC
  Administered 2023-12-19: 2 via ORAL
  Filled 2023-12-19: qty 2

## 2023-12-19 NOTE — Telephone Encounter (Signed)
 Thanks so much. D Dimer was negative on 12/04/23.

## 2023-12-19 NOTE — ED Triage Notes (Signed)
 Pt c/o coughing since August when she was diagnosed w/ COVID.  [facility] sent her here for a CTA and repeat D-dimer.  C/o intermittent burning in central chest w/ coughing.  Pain score 8/10.

## 2023-12-19 NOTE — Discharge Instructions (Signed)
 Negative for pulmonary embolism.  Make an appointment to follow back up with pulmonary medicine continue your current medications.  Return for any new or worse symptoms.

## 2023-12-19 NOTE — ED Notes (Signed)
 Patient given printed discharge instructions. Patient verbalized understanding. Patient taken to ER entrance via wheel chair.

## 2023-12-19 NOTE — Telephone Encounter (Signed)
 Harlene from [facility]iology called to report Critical findings:  CT Chest- interstitial lung disease  Enlarged pulmonary artery and questionable intraluminal hypodensity of bilateral main pulmonary trunk at the hilar region, may represent artifact however pulmonary emboli can not be completely excluded. Evaluation is suboptimal on current noncontrast CT. Recommend dedicated CT angiogram with contrast and D-dimer test for further evaluation.

## 2023-12-19 NOTE — Telephone Encounter (Signed)
 Critical Radiology finding reported to On-Call provider Dr Theophilus.  BETHA Blackbird from [facility]iology called to report Critical findings:   CT Chest- interstitial lung disease   Enlarged pulmonary artery and questionable intraluminal hypodensity of bilateral main pulmonary trunk at the hilar region, may represent artifact however pulmonary emboli can not be completely excluded. Evaluation is suboptimal on current noncontrast CT. Recommend dedicated CT angiogram with contrast and D-dimer test for further evaluation.

## 2023-12-19 NOTE — Telephone Encounter (Signed)
 PCCM result note  Received call report on CT high-resolution done today 12/19/2023 Enlarged pulmonary artery with possible intraluminal hyperdensity suggestive of pulmonary emboli  I called patient and discussed report and advised to go to the emergency room immediately for evaluation Patient is driving to [facility].  I called the charge nurse at [facility], ED to let her know that the patient is on the way.  Lonna Coder MD [place] [facility] 12/19/2023, 3:44 PM

## 2023-12-19 NOTE — ED Provider Notes (Addendum)
 [place] EMERGENCY DEPARTMENT AT [facility] Provider Note   CSN: [id number] Arrival date & time: 12/19/23  1735     Patient presents with: No chief complaint on file.   Lindsay [name] is a 53 y.o. female.   Patient followed by pulmonary medicine for persistent cough after having COVID in August.  They did a high-resolution CT chest today which raise concerns about a pulmonary embolus and she was contacted and told to come in.  Patient does complain of some burning in the chest with persistent coughing this been going on for a while.  Nothing new in the last 2 days.  Oxygen  saturation here on room air is 100% heart rate is 16 temp 99.4 respirations 22 blood pressure 140/103.  Patient states that she is on oxycodone  at home for the pain.  Also was at 1 point prescribed Hydromet that that was helpful.  That is a hydrocodone  syrup.       Prior to Admission medications   Medication Sig Start Date End Date Taking? Authorizing Provider  ACCU-CHEK GUIDE test strip USE ONE TEST STRIP TO CHECK BLOOD SUGARS ONCE A DAY AS NEEDED 10/16/22   Forest Coy, MD  Accu-Chek Softclix Lancets lancets USE ONE LANCET TO CHECK BLOOD SUGAR ONE A DAY AS NEEDED 07/03/22   Forest Coy, MD  acetaminophen  (TYLENOL ) 500 MG tablet Take 1 tablet (500 mg total) by mouth every 6 (six) hours as needed. 09/17/22   Charlyn Sora, MD  albuterol  (PROVENTIL ) (2.5 MG/3ML) 0.083% nebulizer solution INHALE 3 ML BY NEBULIZATION EVERY 6 HOURS AS NEEDED FOR WHEEZING OR SHORTNESS OF BREATH 09/02/23   Olalere, Adewale A, MD  albuterol  (VENTOLIN  HFA) 108 (90 Base) MCG/ACT inhaler 2 PUFFS INHALED EVERY 6 HOURS AS NEEDED FOR 30 DAYS 11/12/23   Olalere, Jennet A, MD  amoxicillin -clavulanate (AUGMENTIN ) 875-125 MG tablet Take 1 tablet by mouth 2 (two) times daily. 12/04/23   Parrett, Madelin RAMAN, NP  atorvastatin  (LIPITOR) 40 MG tablet TAKE 1 TABLET BY MOUTH EVERY DAY 04/14/23   Forest Coy, MD  benzonatate  (TESSALON )  200 MG capsule Take 1 capsule (200 mg total) by mouth 3 (three) times daily as needed. 12/04/23 12/03/24  Parrett, Madelin RAMAN, NP  Brexpiprazole  (REXULTI ) 3 MG TABS Take 1 tablet (3 mg total) by mouth daily. 12/08/23   [name], Leni DASEN, MD  budesonide -glycopyrrolate-formoterol  (BREZTRI  AEROSPHERE) 160-9-4.8 MCG/ACT AERO inhaler Inhale 2 puffs into the lungs in the morning and at bedtime. 11/24/23   Olalere, Jennet A, MD  dicyclomine  (BENTYL ) 10 MG capsule TAKE 1 CAPSULE (10 MG TOTAL) BY MOUTH 3 (THREE) TIMES DAILY AS NEEDED FOR SPASMS (AB PAIN, DIARRHEA). 09/17/23   Craig Alan SAUNDERS, PA-C  diltiazem  (CARDIZEM  CD) 120 MG 24 hr capsule Take 1 capsule (120 mg total) by mouth daily. 06/27/23   Claudene Pacific, MD  doxepin  (SINEQUAN ) 25 MG capsule Take 1 capsule (25 mg total) by mouth at bedtime. 12/08/23   [name], Leni DASEN, MD  fluconazole  (DIFLUCAN ) 150 MG tablet Take 1 tablet (150 mg total) by mouth daily. 12/04/23   Parrett, Madelin RAMAN, NP  fluticasone  (FLONASE ) 50 MCG/ACT nasal spray SPRAY 1 SPRAY INTO EACH NOSTRIL DAILY Patient not taking: Reported on 12/04/2023 09/24/22   Guilloud, Carolyn, MD  guaiFENesin -dextromethorphan (ROBITUSSIN DM) 100-10 MG/5ML syrup Take 10 mLs by mouth every 4 (four) hours as needed for cough. 10/16/23   Olalere, Jennet LABOR, MD  HYDROcodone  bit-homatropine (HYDROMET) 5-1.5 MG/5ML syrup Take 5 mLs by mouth every 6 (six) hours  as needed for cough. 12/04/23   Parrett, Madelin RAMAN, NP  hydrocortisone  (ANUSOL -HC) 2.5 % rectal cream PLACE 1 APPLICATION RECTALLY 2 (TWO) TIMES DAILY. 08/12/23   Craig Alan SAUNDERS, PA-C  ipratropium-albuterol  (DUONEB) 0.5-2.5 (3) MG/3ML SOLN TAKE 3 MLS BY NEBULIZATION 2 (TWO) TIMES DAILY. 11/04/23 12/04/23  Neda Jennet LABOR, MD  lamoTRIgine  (LAMICTAL ) 100 MG tablet Take 1/2 tab twice  daily 12/08/23   [name], Leni DASEN, MD  LINZESS  290 MCG CAPS capsule TAKE 1 CAPSULE BY MOUTH DAILY BEFORE BREAKFAST. 10/29/23   Craig Alan SAUNDERS, PA-C  meclizine  (ANTIVERT ) 25 MG tablet Take 25  mg by mouth 3 (three) times daily. 06/01/23   [provider]  omeprazole  (PRILOSEC) 40 MG capsule TAKE 1 CAPSULE BY MOUTH TWICE A DAY 12/18/23   Craig Alan SAUNDERS, PA-C  ondansetron  (ZOFRAN -ODT) 8 MG disintegrating tablet Take 1 tablet (8 mg total) by mouth every 8 (eight) hours as needed. 05/01/23   Lovie Clarity, MD  oxyCODONE -acetaminophen  (PERCOCET/ROXICET) 5-325 MG tablet Take by mouth. Patient taking differently: Take by mouth.    [provider]  OXYGEN  Inhale into the lungs. 5 LITERS AT HOME, 3 LITERS WHEN OUTSIDE HOME    [provider]  predniSONE  (DELTASONE ) 10 MG tablet 4 tabs for 2 days, then 3 tabs for 2 days, 2 tabs for 2 days, then 1 tab for 2 days, then stop 12/04/23   Parrett, Tammy S, NP  promethazine  (PHENERGAN ) 25 MG tablet TAKE 1 TABLET BY MOUTH EVERY 6 HOURS AS NEEDED FOR NAUSEA OR VOMITING. 10/29/23   Craig Alan R, PA-C  promethazine -dextromethorphan (PROMETHAZINE -DM) 6.25-15 MG/5ML syrup TAKE 5 ML BY MOUTH EVERY 6 HOURS Patient not taking: Reported on 12/04/2023 11/07/23   Neda Jennet LABOR, MD  Safety Seal Miscellaneous MISC Apply 1 Application topically in the morning. Medication Name; AA Gel 04/16/23   Alm Delon SAILOR, DO  Semaglutide , 2 MG/DOSE, (OZEMPIC , 2 MG/DOSE,) 8 MG/3ML SOPN INJECT 2 MG INTO THE SKIN ONCE A WEEK. Patient not taking: Reported on 12/04/2023 04/14/23   Guilloud, Carolyn, MD  SUMAtriptan  (IMITREX ) 25 MG tablet Take 1 tablet (25 mg total) by mouth once for 1 dose. May repeat in 2 hours if headache persists or recurs. 06/27/23 12/04/23  Claudene Pacific, MD  tiZANidine  (ZANAFLEX ) 4 MG tablet Take 4 mg by mouth every 8 (eight) hours as needed for muscle spasms. 06/04/23   [provider]  torsemide  (DEMADEX ) 20 MG tablet TAKE 1 TABLET BY MOUTH TWICE A DAY 01/27/23   Forest Coy, MD    Allergies: Lisinopril     Review of Systems  Constitutional:  Negative for chills and fever.  HENT:  Negative for ear pain and sore  throat.   Eyes:  Negative for pain and visual disturbance.  Respiratory:  Positive for cough. Negative for shortness of breath.   Cardiovascular:  Positive for chest pain. Negative for palpitations.  Gastrointestinal:  Negative for abdominal pain and vomiting.  Genitourinary:  Negative for dysuria and hematuria.  Musculoskeletal:  Negative for arthralgias and back pain.  Skin:  Negative for color change and rash.  Neurological:  Negative for seizures and syncope.  All other systems reviewed and are negative.   Updated Vital Signs BP 101/81   Pulse 95   Temp 99.4 F (37.4 C) (Oral)   Resp 13   Ht 1.803 m (5' 11)   Wt (!) 147.4 kg   SpO2 99%   BMI 45.33 kg/m   Physical Exam Vitals and nursing note reviewed.  Constitutional:      General: She is not in acute distress.    Appearance: She is well-developed. She is obese. She is not ill-appearing.  HENT:     Head: Normocephalic and atraumatic.  Eyes:     Conjunctiva/sclera: Conjunctivae normal.  Cardiovascular:     Rate and Rhythm: Normal rate and regular rhythm.     Heart sounds: No murmur heard. Pulmonary:     Effort: Pulmonary effort is normal. No respiratory distress.     Breath sounds: Normal breath sounds.  Abdominal:     Palpations: Abdomen is soft.     Tenderness: There is no abdominal tenderness.  Musculoskeletal:        General: No swelling.     Cervical back: Neck supple.  Skin:    General: Skin is warm and dry.     Capillary Refill: Capillary refill takes less than 2 seconds.  Neurological:     General: No focal deficit present.     Mental Status: She is alert and oriented to person, place, and time.  Psychiatric:        Mood and Affect: Mood normal.     (all labs ordered are listed, but only abnormal results are displayed) Labs Reviewed  CBC WITH DIFFERENTIAL/PLATELET  BASIC METABOLIC PANEL WITH GFR    EKG: None  Radiology: CT CHEST HIGH RESOLUTION Result Date: 12/19/2023 CLINICAL DATA:   Interstitial lung disease, COPD with acute exacerbation post viral cough syndrome EXAM: CT CHEST WITHOUT CONTRAST TECHNIQUE: Multidetector CT imaging of the chest was performed following the standard protocol without intravenous contrast. High resolution imaging of the lungs, as well as inspiratory and expiratory imaging, was performed. RADIATION DOSE REDUCTION: This exam was performed according to the departmental dose-optimization program which includes automated exposure control, adjustment of the mA and/or kV according to patient size and/or use of iterative reconstruction technique. COMPARISON:  Chest radiograph December 04, 2023. CT chest angiogram May 23, 2023. FINDINGS: Cardiovascular: The heart size is normal. Pulmonary artery is enlarged measuring up to 3.4 cm. Questionable intraluminal hypodensity as bilateral hilar regions raises the concern for possible pulmonary emboli. Exam is suboptimal given the lack of contrast. No pericardial fluid. Atherosclerotic calcifications of aorta and coronary arteries. Mediastinum/Nodes: Bilateral axillary and subpectoral prominent subcentimeter and pericentimeter lymph nodes measuring up to 11 mm (2/29, 36). No suspicious mediastinal lymphadenopathy. Mildly patulous esophagus. Lungs/Pleura: Left lower lobe solid noncalcified nodule measuring 5 mm (10/80). Right upper lobe calcified micronodule (10/63). No significant emphysematous changes, honeycombing or architectural distortion. Interval resolution of consolidations on recent chest radiograph overlying the right middle and lower lobes. Previously seen patchy consolidations in left lower lobe on prior CT is resolved. No interlobular septal thickening or fibrosis. Trachea and major airways are patent. Mild bronchial wall thickening . No evidence of mosaic ground-glass attenuation on expiratory phase to suggest air trapping. No pleural fluid. Upper Abdomen: Unremarkable Musculoskeletal: Multilevel degenerative changes of  the spine and mild dextroconvex scoliosis of the thoracic spine. IMPRESSION: Enlarged pulmonary artery and questionable intraluminal hypodensity of bilateral main pulmonary trunk at the hilar region, may represent artifact however pulmonary emboli can not be completely excluded. Evaluation is suboptimal on current noncontrast CT. Recommend dedicated CT angiogram with contrast and D-dimer test for further evaluation. A few scattered pulmonary nodules up to 5 mm, stable to prior. Follow-up in 1 year to ensure stability. No new consolidation. No significant emphysematous changes, fibrosis or evidence of air trapping to suggest interstitial lung disease. Mild bronchial and  bronchiolar wall thickening suggestive of medium and large size airway disease. Atherosclerotic calcifications of aorta and coronary arteries. Electronically Signed   By: Megan  Zare M.D.   On: 12/19/2023 13:10     Procedures   Medications Ordered in the ED  oxyCODONE -acetaminophen  (PERCOCET/ROXICET) 5-325 MG per tablet 2 tablet (has no administration in time range)                                    Medical Decision Making Amount and/or Complexity of Data Reviewed Labs: ordered. Radiology: ordered.  Risk Prescription drug management.   Patient's CT high-resolution raising concerns about PE.  Will going get labs and get CT angio chest to rule in or rule it out.  Patient nontoxic no acute distress.  Will give some oxycodone  to help her with the chest discomfort that is baseline and also for the cough which is baseline.  CBC white blood cell count 12 hemoglobin 13.1 platelets 397.  Basic metabolic panel normal including renal function.  CT angio chest no evidence of pulmonary embolism.  There is a stable 6 mm pulmonary nodule left lower lung no further follow-up required.  No other acute findings.  Will refer patient back to pulmonary medicine for her post-COVID chronic cough.  Final diagnoses:  Chronic cough    ED  Discharge Orders     None          Geraldene Hamilton, MD 12/19/23 JACKEY Geraldene Hamilton, MD 12/19/23 2223

## 2023-12-21 ENCOUNTER — Other Ambulatory Visit (HOSPITAL_COMMUNITY): Payer: Self-pay | Admitting: Psychiatry

## 2023-12-21 DIAGNOSIS — F331 Major depressive disorder, recurrent, moderate: Secondary | ICD-10-CM

## 2023-12-22 ENCOUNTER — Encounter: Payer: Self-pay | Admitting: Adult Health

## 2023-12-22 ENCOUNTER — Telehealth: Admitting: Adult Health

## 2023-12-22 ENCOUNTER — Ambulatory Visit: Admitting: Adult Health

## 2023-12-22 DIAGNOSIS — J9611 Chronic respiratory failure with hypoxia: Secondary | ICD-10-CM | POA: Diagnosis not present

## 2023-12-22 DIAGNOSIS — R058 Other specified cough: Secondary | ICD-10-CM

## 2023-12-22 DIAGNOSIS — J441 Chronic obstructive pulmonary disease with (acute) exacerbation: Secondary | ICD-10-CM | POA: Diagnosis not present

## 2023-12-22 DIAGNOSIS — I2729 Other secondary pulmonary hypertension: Secondary | ICD-10-CM

## 2023-12-22 MED ORDER — PREDNISONE 10 MG PO TABS: ORAL_TABLET | ORAL | 0 refills | Status: AC

## 2023-12-22 NOTE — Progress Notes (Signed)
 Virtual Visit via Video Note  I connected with Lindsay [name] on 12/22/23 at  1:00 PM EST by a video enabled telemedicine application and verified that I am speaking with the correct person using two identifiers.  Location: Patient: Home  Provider: Office    I discussed the limitations of evaluation and management by telemedicine and the availability of in person appointments. The patient expressed understanding and agreed to proceed.  History of Present Illness: 53 year old female former smoker followed for COPD, chronic respiratory failure on oxygen  and pulmonary hypertension. History of sleep apnea-repeat testing negative for sleep apnea Medical history significant for diastolic heart failure, depression and PTSD Today's video visit is a 2-week follow-up.  Last visit patient was having ongoing complaints of a increased cough and congestion following COVID-19 infection experienced in August 2025.  She was treated for a slow to resolve COPD exacerbation. With possible superimposed pneumonia.  Chest x-ray showed right middle lobe opacity. Lab work showed a normal white blood cell count, negative D-dimer and BNP.  She was treated with extended antibiotics with Augmentin  for total 14 days of therapy.  Given a Depo-Medrol  120 IM injection.  And started on a prednisone  taper.  She was set up for high-resolution CT chest that was completed on December 19, 2023 that was negative for interstitial lung disease, resolved right middle and lower lobe consolidation, scattered pulmonary nodules measuring up to 5 mm.  Incidental finding of possible intraluminal hypodensity in the pulmonary artery.  Subsequent CT chest angioWas negative was negative for PE, stable pulmonary nodule without change since 2021.  Patient says she is improved but continues to have ongoing coughing.  She is using Mucinex  DM or Tustin DM over-the-counter for cough.  Continues to use oxygen  at 5 L.  Patient had a recent sleep study done  earlier this month that was negative for sleep apnea.  Sleep study was performed on 3 L of oxygen .  We discussed setting up an overnight oximetry test on oxygen  at 3 L at bedtime.  She denies any fever, hemoptysis, orthopnea or increased edema.    Observations/Objective: Patient appears in no acute distress.  Lying in bed with oxygen  on.  Positive cough throughout the exam  PFTs August 22, 2023  showed moderate restriction and obstruction with FEV1 at 53%, ratio 72, FVC 57%, severe mid flow obstruction, no significant bronchodilator response, DLCO 52%.,  Current weight 337 pounds.   CT chest May 23, 2023 atelectasis left base, mild left hemidiaphragm elevation, unchanged 5 mm left lower lobe pulmonary nodule, negative PE   CT chest January 12, 2023 5 mm left lower lobe nodule, patchy left lower lobe opacity, negative PE  Right and left heart coronary angiography- EF adequate, mild pulmonary hypertension   NPSG December 02, 2023-negative for obstructive sleep apnea, sleep study performed on at 3 L of O2.    Assessment and Plan: Slow to resolve COPD exacerbation with ongoing chronic cough-slowly improving but with ongoing upper airway cough syndrome.  Continue on Breztri  twice daily.   Continue on aggressive cough suppression regimen with Tustin DM, Tessalon  Perles.  Will use slow prednisone  taper over the next 10 days low-dose.  Add in Pepcid  20 mg at bedtime for possible GERD component.  Continue on daily antihistamine with Zyrtec  daily.  Add in chlor tabs 4 mg every 8 hours as needed for postnasal drip.  Postviral cough-chronic cough present since COVID-19 in August 2025.  High-resolution CT chest negative for ILD or postinflammatory changes. Continue with aggressive  cough suppression regimen.  Continue on GERD and chronic rhinitis trigger prevention.  Chronic respiratory failure-stable on oxygen . Continue on oxygen  5 L with activity.  Decrease oxygen  to 3 L at bedtime.  Check overnight  oximetry test on 3 L.  Recent abnormal CT chest with possible density in the pulmonary artery.  Follow-up CT angio PE protocol negative for PE.  Lung nodule-stable on recent CT chest dating back to 2019.  Morbid obesity.  Continue with healthy weight loss.  BMI at 45  History of sleep apnea.  Repeat sleep study earlier this month negative for sleep apnea.  Continue on nocturnal oxygen  at 3 L.  Mild pulmonary hypertension noted on right heart cath May 2025-continue on oxygen  to maintain O2 saturations greater than 88 to 90%.  Repeat sleep study negative for sleep apnea.   Plan  Patient Instructions  Prednisone  20mg  daily for 5 days, then 10mg  daily for 5 days and stop.  Change to Tussin DM 1-2 tsp every 4hr As needed  cough.  Tessalon  Three times a day  for cough As needed   Continue on Breztri  2 puffs Twice daily rinse after use.  Albuterol  inhaler or neb As needed   Continue on Omeprazole  daily  Continue on Zyrtec  daily.  Add Pepcid  20mg  At bedtime   May Try Chlorpheniramine 4mg  every 8 hr as needed for tickle in throat, sinus drainage- use with caution as may make you sleepy.  (Chlor tab)  Continue on Oxygen  5l/m with activity.  Decrease Oxygen  3l/m At bedtime   Set up Overnight oximetry test to check oxygen  on 3l/m At bedtime   Follow up in 6 weeks with Neda or Kyeshia Zinn NP  Please contact office for sooner follow up if symptoms do not improve or worsen or seek emergency care     Follow Up Instructions:    I discussed the assessment and treatment plan with the patient. The patient was provided an opportunity to ask questions and all were answered. The patient agreed with the plan and demonstrated an understanding of the instructions.   The patient was advised to call back or seek an in-person evaluation if the symptoms worsen or if the condition fails to improve as anticipated.  I provided 30  minutes of non-face-to-face time during this encounter.   Madelin Stank,  NP

## 2023-12-22 NOTE — Patient Instructions (Addendum)
 Prednisone  20mg  daily for 5 days, then 10mg  daily for 5 days and stop.  Change to Tussin DM 1-2 tsp every 4hr As needed  cough.  Tessalon  Three times a day  for cough As needed   Continue on Breztri  2 puffs Twice daily rinse after use.  Albuterol  inhaler or neb As needed   Continue on Omeprazole  daily  Continue on Zyrtec  daily.  Add Pepcid  20mg  At bedtime   May Try Chlorpheniramine 4mg  every 8 hr as needed for tickle in throat, sinus drainage- use with caution as may make you sleepy.  (Chlor tab)  Continue on Oxygen  5l/m with activity.  Decrease Oxygen  3l/m At bedtime   Set up Overnight oximetry test to check oxygen  on 3l/m At bedtime   Follow up in 6 weeks with Neda or Lithzy Bernard NP  Please contact office for sooner follow up if symptoms do not improve or worsen or seek emergency care

## 2024-01-02 ENCOUNTER — Ambulatory Visit (INDEPENDENT_AMBULATORY_CARE_PROVIDER_SITE_OTHER): Admitting: Licensed Clinical Social Worker

## 2024-01-02 DIAGNOSIS — F410 Panic disorder [episodic paroxysmal anxiety] without agoraphobia: Secondary | ICD-10-CM

## 2024-01-02 DIAGNOSIS — F411 Generalized anxiety disorder: Secondary | ICD-10-CM

## 2024-01-02 DIAGNOSIS — F331 Major depressive disorder, recurrent, moderate: Secondary | ICD-10-CM

## 2024-01-02 DIAGNOSIS — F4312 Post-traumatic stress disorder, chronic: Secondary | ICD-10-CM

## 2024-01-02 NOTE — Progress Notes (Signed)
 Virtual Visit via Video Note  I connected with Lindsay [name] on 01/02/24 at  8:00 AM EST by a video enabled telemedicine application and verified that I am speaking with the correct person using two identifiers.  Location: Patient: home Provider: home office   I discussed the limitations of evaluation and management by telemedicine and the availability of in person appointments. The patient expressed understanding and agreed to proceed.   I discussed the assessment and treatment plan with the patient. The patient was provided an opportunity to ask questions and all were answered. The patient agreed with the plan and demonstrated an understanding of the instructions.   The patient was advised to call back or seek an in-person evaluation if the symptoms worsen or if the condition fails to improve as anticipated.  I provided 30 minutes of non-face-to-face time during this encounter.  THERAPIST PROGRESS NOTE  Session Time: 8:00 AM to 8:30 AM  Participation Level: Active  Behavioral Response: CasualAlertDysphoric  Type of Therapy: Individual Therapy  Treatment Goals addressed: Patient reports therapy helps cope with her stressors continue to work on coping strategies to help improve symptoms of her diagnoses, coping in general  ProgressTowards Goals: Progressing-patient has significant stressors therapy helps with coping not feeling well with restart of Ozempic  so shorter session today but still therapist support think helpful for patient  Interventions: Solution Focused, Strength-based, Supportive, and Other: coping  Summary: Lindsay [name] is a 53 y.o. female who presents with ok amongst the living. Therapist asked why up all night? Restarted Ozempic  sick to stomach took at higher dose what talking before forget impact to stomach to letting it wear off she chewed and swallowed ginger. Therapist asked if that helped and she said it did. Stop talking thought reason stomach swelling it  can cause that just started back up. Therapist shared her impression of patient that seems to be doing better than last time.  Patient herself not affirming better. They thought blood clot on lung rushed to hospital no blood clot on lung. Not eligible for CPAP now has to take a test, has to put an Oximeter on because can't breath difficult see if can breath with 3L or not.  Therapist ask again what else she is doing she has nebulizer and inhaler not doing the trick. Pneumonia said clearing up. Internal bleeding a lot of stuff going on.  Patient shared frustration with medical providers. Feel terrible want to throw up feels very hot and window up.  Was okay not feeling well for shorter session patient shared had birthday or the other day and rather than talk about it we will talk about next session because she is not feeling well.  Therapist provided supportive interventions active listening think help patient with mental health symptoms and coping.   Suicidal/Homicidal: No  Plan: Return again in 1 week.2.  Processed thoughts and feelings in session help with coping  Diagnosis: Major depressive disorder, recurrent, moderate, generalized anxiety disorder, chronic PTSD, panic attacks  Collaboration of Care: Other none needed  Patient/Guardian was advised Release of Information must be obtained prior to any record release in order to collaborate their care with an outside provider. Patient/Guardian was advised if they have not already done so to contact the registration department to sign all necessary forms in order for us  to release information regarding their care.   Consent: Patient/Guardian gives verbal consent for treatment and assignment of benefits for services provided during this visit. Patient/Guardian expressed understanding and agreed to proceed.  Ronal Sink, LCSW 01/02/2024

## 2024-01-03 ENCOUNTER — Other Ambulatory Visit: Payer: Self-pay | Admitting: Pulmonary Disease

## 2024-01-03 ENCOUNTER — Other Ambulatory Visit: Payer: Self-pay | Admitting: Physician Assistant

## 2024-01-07 ENCOUNTER — Ambulatory Visit (INDEPENDENT_AMBULATORY_CARE_PROVIDER_SITE_OTHER): Admitting: Licensed Clinical Social Worker

## 2024-01-07 DIAGNOSIS — F411 Generalized anxiety disorder: Secondary | ICD-10-CM

## 2024-01-07 DIAGNOSIS — F331 Major depressive disorder, recurrent, moderate: Secondary | ICD-10-CM

## 2024-01-07 DIAGNOSIS — F4312 Post-traumatic stress disorder, chronic: Secondary | ICD-10-CM

## 2024-01-07 DIAGNOSIS — F410 Panic disorder [episodic paroxysmal anxiety] without agoraphobia: Secondary | ICD-10-CM

## 2024-01-07 NOTE — Progress Notes (Signed)
 Virtual Visit via Video Note  I connected with Lindsay [name] on 01/07/24 at  1:00 PM EST by a video enabled telemedicine application and verified that I am speaking with the correct person using two identifiers.  Location: Patient: home Provider: home office   I discussed the limitations of evaluation and management by telemedicine and the availability of in person appointments. The patient expressed understanding and agreed to proceed.  I discussed the assessment and treatment plan with the patient. The patient was provided an opportunity to ask questions and all were answered. The patient agreed with the plan and demonstrated an understanding of the instructions.   The patient was advised to call back or seek an in-person evaluation if the symptoms worsen or if the condition fails to improve as anticipated.  I provided 47 minutes of non-face-to-face time during this encounter.  THERAPIST PROGRESS NOTE  Session Time: 1:00 PM to 1:47 PM  Participation Level: Active  Behavioral Response: CasualAlertAnxious and appropriate  Type of Therapy: Individual Therapy  Treatment Goals addressed: Patient reports therapy helps cope with her stressors continue to work on coping strategies to help improve symptoms of her diagnoses, coping in general  ProgressTowards Goals: Progressing-helpful to process thoughts and feelings in session as patient has various stressors helps with coping reinforcing positive attitude positive developments also helpful for coping  Interventions: Solution Focused, Strength-based, Supportive, and Other: coping  Summary: Lindsay [name] is a 53 y.o. female who presents with therapist having her check-in and she relates she says doing I guess was sick to stomach a lot better than when saw therapist. Back on Oxempic because best for A1C began to be so starved wanting to eat massive amount of food since on not have the appetite back to taming appetite. Oximeter can't do  until fix machine it is going full blast has to do test with 3 L two nights in a row at night when sleep. With sleep specialist told should be a 5 L and put 3 L patient asks why? The test is because need accurate measure sleep study didn't get the accurate data. Something collapsing at night sleep specialist buying into what pulmonary anything else but pulmonary such as weight, acid reflux when patient says it is pulmonary issues. At night jump something cuts off her airway. Most of the time sit up sleeping afraid of cutting air. Something collapsing back there. Maybe not CPAP but something not breathing another diagnosis patient said and are two things very similar. Go back to pulmonary the 22. Want to talk to doctor face to face not responding to her My Chart. Will see beginning of year. Stomach swollen Linzess  can do it, Ozmepic can cause that. Ruled out took all medicines that could cause it. Doctor need to tell her what is going on. GI doctor. One who is following is nurse practitioner did everything else but GI, blood work, worried about the cough patient wants her to focus on GI issues. Internal bleeding nobody said anything about it. Cirrhosis may be a reason for swollen stomach she is not doing anything why need to be with somebody else. Patient says in terms of her medical issues coughing so many things going on. PCP send her to places doesn't speak up to the doctors has different doctors and patient has concerns want him to call them. He says no you go and tell them. Patient was at office with issues and he is yelling at her because she came there sick when there wants  a different PCP. Patient says her breathing is the priority among all the medical issues. Patient says mental health have to be able to communicate if can't communicate then nobody. What need to laugh and to live can't live because worried about these different things has to advocate for herself and only sane way is to talk to therapist and  God nobody listen besides therapist and God. If don't then things go to left cuss somebody else out. Is going to cuss somebody and not kidding but only thing stopping therapist and God. Panic attack and the meds help her be laid back and calm doesn't stop from cussing. Keep balanced maintain composure most of the time can if with doctor going nowhere fast will tell them about themselves. Go there not ghetto tell them know about themselves have to to stand up for herself.  Therapist agrees needs to remain an advocate stand up for self if she needs to.  Patient says when first talked to therapist not disciplined in prayer still have to faith asked God to have a close relationship pray more. At 12:01 thank God for seeing another day. When praying and talking to God comes back situation has a positive to it, see that it comes through. Communicate the best way you can in your way patient suggests. Own niche find own way to connect with God. Wants a better relationship and patient said she has found that is keep talking and praising God. Tell people can breath, some people can work same time other people on respiratory worse off then her could be worse for her. 53 a lot has to change even including how approach and how handle things. Can see cussing redirect by finding another provider. Talking to therapist and God if not patient says some shit would have gone down.  Learned a long time ago pastor go to a place where only you and God can meet up you know that place learned if something going on prayer and God is going to reach her and in that spot. Shit happens go to where her and God meet and start praying. Find a place where you can connect stand in front of someone no matter what happens is handled. Had to do that sometimes in order to do that tell therapist and God. Only where you and God can meet. No matter where you out feel calmness. Look at life and God there to help there you got through this and  that. Social security came through for her daughter went to court in October. Stressed about it but approved the case. Other legal stuff has to look for good attorney. Patient has to address health issues. Mom has stomach issues scared what they are going to say at GI doctor about her mom. 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 A business giving back not a limit of what can get. Overwhelming for patient. Some type of real estate agency. On their website say thank you. Took her picture with patient.  Therapist said this is what this is all about and also good to hear there are some good people remind us  they are good people when you hear things like that.     Suicidal/Homicidal: No  Plan: Return again in 1 week.2.  Both therapist and patient look for inspirational things to help with coping. 3.  Therapist continue to provide supportive interventions while processing thoughts and feelings in session help patient with coping  Diagnosis: Major depressive disorder, recurrent, moderate, generalized  anxiety disorder, chronic PTSD, panic attacks  Collaboration of Care: Other none needed  Patient/Guardian was advised Release of Information must be obtained prior to any record release in order to collaborate their care with an outside provider. Patient/Guardian was advised if they have not already done so to contact the registration department to sign all necessary forms in order for us  to release information regarding their care.   Consent: Patient/Guardian gives verbal consent for treatment and assignment of benefits for services provided during this visit. Patient/Guardian expressed understanding and agreed to proceed.   Ronal Sink, LCSW 01/07/2024

## 2024-01-13 ENCOUNTER — Other Ambulatory Visit: Payer: Self-pay | Admitting: Pulmonary Disease

## 2024-01-13 DIAGNOSIS — J4540 Moderate persistent asthma, uncomplicated: Secondary | ICD-10-CM

## 2024-01-14 ENCOUNTER — Ambulatory Visit (INDEPENDENT_AMBULATORY_CARE_PROVIDER_SITE_OTHER): Admitting: Licensed Clinical Social Worker

## 2024-01-14 ENCOUNTER — Other Ambulatory Visit: Payer: Self-pay | Admitting: Pulmonary Disease

## 2024-01-14 DIAGNOSIS — R051 Acute cough: Secondary | ICD-10-CM

## 2024-01-14 DIAGNOSIS — F331 Major depressive disorder, recurrent, moderate: Secondary | ICD-10-CM

## 2024-01-14 DIAGNOSIS — F411 Generalized anxiety disorder: Secondary | ICD-10-CM | POA: Diagnosis not present

## 2024-01-14 DIAGNOSIS — F410 Panic disorder [episodic paroxysmal anxiety] without agoraphobia: Secondary | ICD-10-CM | POA: Diagnosis not present

## 2024-01-14 DIAGNOSIS — F4312 Post-traumatic stress disorder, chronic: Secondary | ICD-10-CM

## 2024-01-14 NOTE — Progress Notes (Signed)
 Virtual Visit via Video Note  I connected with Shahidah Heathcock on 01/14/2024 at  2:00 PM EST by a video enabled telemedicine application and verified that I am speaking with the correct person using two identifiers.  Location: Patient: home Provider: office    I discussed the limitations of evaluation and management by telemedicine and the availability of in person appointments. The patient expressed understanding and agreed to proceed.  I discussed the assessment and treatment plan with the patient. The patient was provided an opportunity to ask questions and all were answered. The patient agreed with the plan and demonstrated an understanding of the instructions.   The patient was advised to call back or seek an in-person evaluation if the symptoms worsen or if the condition fails to improve as anticipated.  I provided 20 minutes of non-face-to-face time during this encounter.  THERAPIST PROGRESS NOTE  Session Time: 2:00 PM to 2:20 PM  Participation Level: Active  Behavioral Response: CasualAlertsubdued  Type of Therapy: Individual Therapy  Treatment Goals addressed: Patient reports therapy helps cope with her stressors continue to work on coping strategies to help improve symptoms of her diagnoses, coping in general  ProgressTowards Goals: Progressing-patient benefits from therapy from supportive intervention strong therapeutic relationship checked in with therapist but shorter as she would cough every time she talked  Interventions: Solution Focused, Strength-based, Supportive, and Other: Coping  Summary: Dorette Hartel is a 53 y.o. female who presents with was going to come this morning which took therapist place surprises reflected on the relationship you are both surprised we have been doing therapy for 5-1/2 years.  Therapist noted that indicates to her in itself a strong relationship.  Patient said all her connection has been video and phone feels we need in person given the  dedication we have put to therapy sessions.  Thought the session was this morning and got sick started coughing and then gagging. Asked pulmonologist for cough syrup prescription strength I have not got yet.  Patient plans to try again to come in person next week was able to do check it therapist provided active listening supportive interventions.  Suicidal/Homicidal: No  Plan: Return again in 1 week.2.Both therapist and patient look for inspirational things to help with coping. 3.  Therapist continue to provide supportive interventions while processing thoughts and feelings in session help patient with coping  Diagnosis: Major depressive disorder, recurrent, moderate, generalized anxiety disorder, chronic PTSD, panic attacks  Collaboration of Care: Other none needed  Patient/Guardian was advised Release of Information must be obtained prior to any record release in order to collaborate their care with an outside provider. Patient/Guardian was advised if they have not already done so to contact the registration department to sign all necessary forms in order for us  to release information regarding their care.   Consent: Patient/Guardian gives verbal consent for treatment and assignment of benefits for services provided during this visit. Patient/Guardian expressed understanding and agreed to proceed.   Ronal Sink, LCSW 01/14/2024

## 2024-01-14 NOTE — Telephone Encounter (Unsigned)
 Copied from CRM #[id number]. Topic: Clinical - Medication Refill >> Jan 14, 2024 11:21 AM Essie A wrote: Medication: promethazine -dextromethorphan (PROMETHAZINE -DM) 6.25-15 MG/5ML syrup  Has the patient contacted their pharmacy? Yes (Agent: If no, request that the patient contact the pharmacy for the refill. If patient does not wish to contact the pharmacy document the reason why and proceed with request.) (Agent: If yes, when and what did the pharmacy advise?)  This is the patient's preferred pharmacy:  [id number] [place], [place] - [address] [facility] [address] [place] [address] Phone: [phone] Fax: [phone]  Is this the correct pharmacy for this prescription? Yes If no, delete pharmacy and type the correct one.   Has the prescription been filled recently? Yes  Is the patient out of the medication? Yes  Has the patient been seen for an appointment in the last year OR does the patient have an upcoming appointment? Yes  Can we respond through MyChart? Yes  Agent: Please be advised that Rx refills may take up to 3 business days. We ask that you follow-up with your pharmacy.

## 2024-01-14 NOTE — Progress Notes (Signed)
 SABRA

## 2024-01-19 ENCOUNTER — Ambulatory Visit: Admitting: Adult Health

## 2024-01-19 ENCOUNTER — Ambulatory Visit: Payer: Self-pay | Admitting: Pulmonary Disease

## 2024-01-19 DIAGNOSIS — R051 Acute cough: Secondary | ICD-10-CM

## 2024-01-19 NOTE — Telephone Encounter (Signed)
 FYI Only or Action Required?: Action required by provider: request for appointment, clinical question for provider, and update on patient condition.  Patient is followed in Pulmonology for COPD, last seen on 12/22/2023 by Lindsay [name], Lindsay RAMAN, NP.  Called Nurse Triage reporting Cough.  Symptoms began several weeks ago.  Interventions attempted: Rescue inhaler, Maintenance inhaler, Nebulizer treatments, and Home oxygen  use.  Symptoms are: unchanged.  Triage Disposition: See Physician Within 24 Hours  Patient/caregiver understands and will follow disposition?: No, wishes to speak with PCP  E2C2 Pulmonary Triage - Initial Assessment Questions Chief Complaint (e.g., cough, sob, wheezing, fever, chills, sweat or additional symptoms) *Go to specific symptom protocol after initial questions. Patient reports productive cough with yellow sputum with streaks of blood at times. Patient is reporting wheezing at times. Shortness of breath at baseline. Patient missed her follow up appointment today due to [name] transport not coming. Patient is coughing throughout the call. Requesting prescription cough syrup and asking for a refill on her duoneb treatments. Reports she only has six vials lefts. Patient is asking for an appointment in Jan as her insurance will change and having better ride options. Asking for a call back from the office.   How long have symptoms been present? Cough has been going on since August when she was dx with COVID  Have you tested for COVID or Flu? Note: If not, ask patient if a home test can be taken. If so, instruct patient to call back for positive results. No  MEDICINES:   Have you used any OTC meds to help with symptoms? No If yes, ask What medications?   Have you used your inhalers/maintenance medication? Yes If yes, What medications? Albuterol  inhaler/nebulizer Breztri  Duoneb  If inhaler, ask How many puffs and how often? Note: Review instructions on  medication in the chart. Albuterol  inhaler 2 puffs Q6H PRN Breztri  2 puffs BID  OXYGEN : Do you wear supplemental oxygen ? Yes If yes, How many liters are you supposed to use? 3-5L  Do you monitor your oxygen  levels? Yes If yes, What is your reading (oxygen  level) today? Patient unable to say what her level is  What is your usual oxygen  saturation reading?  (Note: Pulmonary O2 sats should be 90% or greater)    Copied from CRM #[id number]. Topic: Clinical - Red Word Triage >> Jan 19, 2024 10:33 AM Lindsay [name] wrote: Red Word that prompted transfer to Nurse Triage: Patient [phone] states Medicaid transportation did not show up today and patient missed appointment 01/19/24 at 10 am with NP, Lindsay [name]. Patient still wants to be seen for cough, has been coughing since August 2025, and wants to rescheduled in January 2026. Patient was last seen 12/22/23 with NP, Lindsay [name] and Dr. Neda 09/16/23. Patient states cough is not better, phelgm is yellowish and tiny streak of pink, shortness of breath is not more than usual, wheezing, low grade fever. Patient denies pain, nor dizziness. Patient did not use overnight oximetry test to test oxygen  because the company did provide instructions. Please advise.   [id number] [place], [place] - [address] AT [address] [place] [address] Phone: [phone] Fax: [phone]     ----------------------------------------------------------------------- From previous Reason for Contact - Scheduling: Patient/patient representative is calling to schedule an appointment. Refer to attachments for appointment information. >> Jan 19, 2024 10:40 AM Chantha C wrote: Patient states cannot hold any longer and wants NT to call patient back. Please advise and call back.  Reason for Disposition  SEVERE coughing spells (e.g., whooping sound after coughing, vomiting after coughing)  Answer Assessment - Initial Assessment Questions 7.  CARDIAC HISTORY: Do you have any history of heart disease? (e.g., heart attack, congestive heart failure)      yes 8. LUNG HISTORY: Do you have any history of lung disease?  (e.g., pulmonary embolus, asthma, emphysema)     yes 12. TRAVEL: Have you traveled out of the country in the last month? (e.g., travel history, exposures)       no  Protocols used: Cough - Chronic-A-AH

## 2024-01-19 NOTE — Telephone Encounter (Signed)
 I called and spoke with patient, she missed her apointment with Tammy this morning d/t transportation not showing up.  She is requesting a refill of her duoneb and the Promethazine  DM cough medication.  At her last OV with Tammy, she was instructed to use Albuter ol inhaler or albuterol  neb.  I scheduled her a f/u with Tammy for 02/26/24 per patient request, this will be after the completion of her ONO.  I let her know I would send a message to Dr. Neda and once we hear back, we would call back with his recommendations.  She verbalized understanding.  Dr. Neda, Please advise regarding refilling duoneb and promethazine  DM cough syrup. Thank you.

## 2024-01-20 ENCOUNTER — Telehealth: Payer: Self-pay

## 2024-01-20 ENCOUNTER — Other Ambulatory Visit: Payer: Self-pay | Admitting: Pulmonary Disease

## 2024-01-20 MED ORDER — GUAIFENESIN-DM 100-10 MG/5ML PO SYRP: 10.0000 mL | ORAL_SOLUTION | ORAL | 0 refills | Status: DC | PRN

## 2024-01-20 MED ORDER — IPRATROPIUM-ALBUTEROL 0.5-2.5 (3) MG/3ML IN SOLN: 3.0000 mL | Freq: Two times a day (BID) | RESPIRATORY_TRACT | 1 refills | Status: AC

## 2024-01-20 NOTE — Telephone Encounter (Signed)
 Copied from CRM #[id number]. Topic: Clinical - Red Word Triage >> Jan 19, 2024 10:33 AM Leila BROCKS wrote: Red Word that prompted transfer to Nurse Triage: Patient [phone] states Medicaid transportation did not show up today and patient missed appointment 01/19/24 at 10 am with NP, Parrett. Patient still wants to be seen for cough, has been coughing since August 2025, and wants to rescheduled in January 2026. Patient was last seen 12/22/23 with NP, Parrett and Dr. Neda 09/16/23. Patient states cough is not better, phelgm is yellowish and tiny streak of pink, shortness of breath is not more than usual, wheezing, low grade fever. Patient denies pain, nor dizziness. Patient did not use overnight oximetry test to test oxygen  because the company did provide instructions. Please advise.   [id number] [place], [place] - [address] AT [address] [place] [address] [phone]:[phone]     ----------------------------------------------------------------------- From previous Reason for Contact - Scheduling: Patient/patient representative is calling to schedule an appointment. Refer to attachments for appointment information. >> Jan 20, 2024  5:42 PM Rozanna G wrote: Pt calling back stating the cough medicine that the provider stated in the my chart message is not the same that was sent to the pharmacy can someone on this and contact the pt please. Pt stated she never rec'd a back on yesterday fromt he nurse. Thanks  >> Jan 19, 2024 10:40 AM Chantha C wrote: Patient states cannot hold any longer and wants NT to call patient back. Please advise and call back.

## 2024-01-21 ENCOUNTER — Ambulatory Visit (HOSPITAL_COMMUNITY): Admitting: Licensed Clinical Social Worker

## 2024-01-21 DIAGNOSIS — F411 Generalized anxiety disorder: Secondary | ICD-10-CM

## 2024-01-21 DIAGNOSIS — F331 Major depressive disorder, recurrent, moderate: Secondary | ICD-10-CM

## 2024-01-21 DIAGNOSIS — F4312 Post-traumatic stress disorder, chronic: Secondary | ICD-10-CM | POA: Diagnosis not present

## 2024-01-21 DIAGNOSIS — F410 Panic disorder [episodic paroxysmal anxiety] without agoraphobia: Secondary | ICD-10-CM | POA: Diagnosis not present

## 2024-01-21 MED ORDER — PROMETHAZINE-DM 6.25-15 MG/5ML PO SYRP: 5.0000 mL | ORAL_SOLUTION | Freq: Four times a day (QID) | ORAL | 0 refills | Status: AC | PRN

## 2024-01-21 NOTE — Telephone Encounter (Signed)
 Tammy can you advise regarding this. Pt is requesting Promethazine  cough syrup and Robitussin was sent in instead.  Thank you!
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
## 2024-01-21 NOTE — Telephone Encounter (Signed)
Pt notified. Mychart.

## 2024-01-21 NOTE — Telephone Encounter (Signed)
See 01/30/20 encounter

## 2024-01-21 NOTE — Telephone Encounter (Signed)
 Copied from CRM #[id number]. Topic: Clinical - Red Word Triage >> Jan 21, 2024 10:09 AM Rozanna MATSU wrote: Pt calling back about the meds Dr Neda sent on 12/22 pt is stating it still is not reflecting the Promethazine . Thanks  >> Jan 20, 2024  5:42 PM Rozanna G wrote: Pt calling back stating the cough medicine that the provider stated in the my chart message is not the same that was sent to the pharmacy can someone on this and contact the pt please. Pt stated she never rec'd a back on yesterday fromt he nurse. Thanks    ATC x1. Went straight to vm, left message that we have sent another message to Dr. Neda.

## 2024-01-21 NOTE — Telephone Encounter (Signed)
 Rx sent in as requested .  Keep follow up ov  Please contact office for sooner follow up if symptoms do not improve or worsen or seek emergency care

## 2024-01-26 ENCOUNTER — Telehealth: Payer: Self-pay

## 2024-01-26 NOTE — Patient Outreach (Signed)
 Complex Care Management   Visit Note  01/26/2024  Name:  Lindsay [name] MRN: [id number] DOB: 10-03-1970  Situation: Referral received for Complex Care Management related to Heart Failure and COPD I obtained verbal consent from Patient.  Visit completed with Lindsay [name]  on the phone. Offered to connect Lindsay [name] with her Lindsay [name], states she already has spoken to her once but hasn't heard back in a while.  She needs a DM foot exam.  Background:   Past Medical History:  Diagnosis Date   Acid reflux    Anemia    Iron  Def   Anorexia    CHF (congestive heart failure) (HCC)    Chronic kidney disease    Nephrotic syndrome   Colon polyp 2009   Depression with anxiety    Edema leg    Fibroid tumor 04/2022   Fibromyalgia    Hemorrhoids    Hidradenitis suppurativa    Hypertension    IBS (irritable bowel syndrome)    Low back pain    Migraines    Morbidly obese (HCC)    Neuromuscular disorder (HCC)    fibromyalgia   Neuropathy    Panic attacks    Polyp of vocal cord or larynx    Stroke (HCC)    Tonsil pain     Assessment: Patient Reported Symptoms:  Cognitive Cognitive Status: Alert and oriented to person, place, and time, Insightful and able to interpret abstract concepts, Normal speech and language skills      Neurological Neurological Review of Symptoms: Not assessed    HEENT HEENT Symptoms Reported: Not assessed      Cardiovascular Cardiovascular Symptoms Reported: Fatigue    Respiratory Respiratory Symptoms Reported: Productive cough Additional Respiratory Details: Pulmonary 12/19/23 for Chronic Cough, CT scan prompted Pulmonary to send patient to ED to rule out PE, negative for PE.  Will follow up on 02/26/2024.  New sleep study performed, negative for sleep apnea at this time but Pulmonary has ordered overnight oximetry test on oxygen  3L at bedtime due to patient reports symptom of airway closing off at night    Endocrine Endocrine Symptoms  Reported: Not assessed Is patient diabetic?: Yes Endocrine Comment: A1C 5.5% on 06/25/23  Gastrointestinal Gastrointestinal Symptoms Reported: Not assessed      Genitourinary Genitourinary Symptoms Reported: Not assessed    Integumentary Integumentary Symptoms Reported: Skin changes Additional Integumentary Details: Reports left foot is dry and cracked, was bleeding at one time. This RNCM left VM with nurseline at [facility] for Podiatry referral. Skin Self-Management Outcome: 3 (uncertain)  Musculoskeletal Musculoskelatal Symptoms Reviewed: Not assessed        Psychosocial Psychosocial Symptoms Reported: Other Other Psychosocial Conditions: Patient continues to speak to LCSW at [facility], NEXT appt 01/27/24          01/26/2024    PHQ2-9 Depression Screening   Little interest or pleasure in doing things    Feeling down, depressed, or hopeless    PHQ-2 - Total Score    Trouble falling or staying asleep, or sleeping too much    Feeling tired or having little energy    Poor appetite or overeating     Feeling bad about yourself - or that you are a failure or have let yourself or your family down    Trouble concentrating on things, such as reading the newspaper or watching television    Moving or speaking so slowly that other people could have noticed.  Or the opposite - being so fidgety or restless that you have been moving around a lot more than usual    Thoughts that you would be better off dead, or hurting yourself in some way    PHQ2-9 Total Score    If you checked off any problems, how difficult have these problems made it for you to do your work, take care of things at home, or get along with other people    Depression Interventions/Treatment      There were no vitals filed for this visit.    MEDICATIONS: Patient denies issues with medications today, confirms she is using Breztri  twice daily, using albuterol  as rescue inhaler.   Using promethazine -DM for chronic cough.    Recommendation:   Specialty provider follow-up : Behavioral Health 02/27/23; Pulmonary 02/26/24 Referral to: Podiatry - this RNCM left voicemail on nurseline at PCP office for referral.   Follow Up Plan:   Telephone follow-up in 1 week  [name] Stamp BSN, CCM [place]  [facility] Population Health RN Care Manager Direct Dial: [phone]  Fax: [phone]

## 2024-01-26 NOTE — Patient Instructions (Signed)
 Visit Information  Thank you for taking time to visit with me today. Please don't hesitate to contact me if I can be of assistance to you before our next scheduled appointment.  Your next care management appointment is by telephone on Wednesday, January 7th at 2:30pm.    Please call the care guide team at [phone] if you need to cancel, schedule, or reschedule an appointment.   Please call the [place]  National Suicide Prevention Lifeline: (802)724-9514 or TTY: (716)508-4704 TTY (858)793-9275) to talk to a trained counselor if you are experiencing a Mental Health or Behavioral Health Crisis or need someone to talk to.  [name] Stamp BSN, CCM [place]  [facility] Population Health RN Care Manager Direct Dial: [phone]  Fax: [phone]]

## 2024-01-27 ENCOUNTER — Ambulatory Visit (HOSPITAL_COMMUNITY): Admitting: Licensed Clinical Social Worker

## 2024-01-27 DIAGNOSIS — F4312 Post-traumatic stress disorder, chronic: Secondary | ICD-10-CM

## 2024-01-27 DIAGNOSIS — F331 Major depressive disorder, recurrent, moderate: Secondary | ICD-10-CM

## 2024-01-27 DIAGNOSIS — F411 Generalized anxiety disorder: Secondary | ICD-10-CM | POA: Diagnosis not present

## 2024-01-27 DIAGNOSIS — F410 Panic disorder [episodic paroxysmal anxiety] without agoraphobia: Secondary | ICD-10-CM

## 2024-01-27 NOTE — Progress Notes (Signed)
 " Virtual Visit via Video Note  I connected with Lindsay [name] on 01/27/2024 at  8:00 AM EST by a video enabled telemedicine application and verified that I am speaking with the correct person using two identifiers.  Location: Patient: home Provider: home office   I discussed the limitations of evaluation and management by telemedicine and the availability of in person appointments. The patient expressed understanding and agreed to proceed.   I discussed the assessment and treatment plan with the patient. The patient was provided an opportunity to ask questions and all were answered. The patient agreed with the plan and demonstrated an understanding of the instructions.   The patient was advised to call back or seek an in-person evaluation if the symptoms worsen or if the condition fails to improve as anticipated.  I provided 45 minutes of non-face-to-face time during this encounter.  THERAPIST PROGRESS NOTE  Session Time: 8:00 AM to 8:45 AM  Participation Level: Active  Behavioral Response: CasualAlertappropriate  Type of Therapy: Individual Therapy  Treatment Goals addressed: Patient reports therapy helps cope with her stressors continue to work on coping strategies to help improve symptoms of her diagnoses, coping in general  ProgressTowards Goals: Progressing-therapist notes progress patient connecting with providers who are listening and proactive helping her with medical issues, therapy continues to be helpful for patient processing thoughts and feelings to help with coping  Interventions: Solution Focused, Strength-based, Supportive, and Other: coping  Summary: Lindsay [name] is a 53 y.o. female who presents with going to drugstore wants to get a different type of neosporin went to pain management [facility] meant to ask about the foot two cuts on the bottom. In general thought very helpful. Going to [facility] for primary care. Told them about issues with pulmonary think will be  referring has to have a test first. When coughing in the office wrote a prescription for cough medicine. To patient's provider not make sense every time come  coughing and nothing is being done got prescription cough medication. A nurse [name] called out of the blue airway cut off but not eligible for CPAP another reason to go to new pulmonary patient believes not eligible didn't stop breathing for a certain amount of time. Pain management listens most of the time if not answer then not direct in the right direction. Therapist feedback was happy providers are proactive in helping her. Trying to get stuff done Dorothe won't be here for holidays doesn't think realize patient supposed to get care aid weekends and holidays as well. Don't say anything about the holidays. Wants to go to store have to cook some things collards shared symbolizes money and black eye peas still the money, the wealth of all, first person through the door brings good luck supposed to be a female. This is her tradition for [employer]. Therapist asked how her holiday was. Patient says ay glad is over. Why? Cooked Christmas eve so could relax on the holiday. A little tinge of her wanted to  celebrate next year wants to get a head start want white and blue Christmas the tree looks like has white slow and blue ornaments. Talked about Santa's around the world patient interested about how they  dress differently. Patient says don't like how it turned away from spirituality and it becomes a man made holiday. Patient says with other people they can see God the way they want become political. And she can see God the way she wants.  Therapist asked her to remind her  how to pray really likes the way she prays. She is at a place where only her and God, he knows talking but not aloud when something going on when pray God meet you where you are. Therapist likes this way of praying that God can meet you where you are. Praying but not where people can verbally  hear you. Focus on God and what you need to say basically praying out loud. When things crazy that is when you can call god why seeking God in the moment. This is what she does with panic attacks when triggered begin praying may help to not effect her sometimes can can get a hold of herself before triggered and panic. Therapist noted can see this as one of her main tools.  Talked about self-care things want to start doing. Talking to a guy regularly he is from [place] [address] to [facility] video talk he laughs things about things not want to eat. Patient said wants to make Tik Tok video things she is not eating that something exotic. Not adventurous like that, about what she eats. Therapist noted it would be funny because thinks patient is funny. Talked about in general things to do for self-care. Therapist going on a trip and patient made the comment will feel like Linus without blanket. Therapist thought very sweet and funny.            Suicidal/Homicidal: No  Plan: Return again in 02/23/24 (therapist on vacation) 2.continue to process thoughts and feelings to help enhance mood and with stressors.  Diagnosis: Major depressive disorder, recurrent, moderate, generalized anxiety disorder, chronic PTSD, panic attacks   Collaboration of Care: Other none needed  Patient/Guardian was advised Release of Information must be obtained prior to any record release in order to collaborate their care with an outside provider. Patient/Guardian was advised if they have not already done so to contact the registration department to sign all necessary forms in order for us  to release information regarding their care.   Consent: Patient/Guardian gives verbal consent for treatment and assignment of benefits for services provided during this visit. Patient/Guardian expressed understanding and agreed to proceed.   Ronal Sink, LCSW 01/27/2024  "

## 2024-02-02 ENCOUNTER — Other Ambulatory Visit: Payer: Self-pay | Admitting: Physician Assistant

## 2024-02-04 ENCOUNTER — Other Ambulatory Visit: Payer: Self-pay

## 2024-02-04 NOTE — Patient Outreach (Signed)
 Contacted patient to make sure she was connected with her [facility] Dual Complete Care Navigator, confirms she has a navigator assigned to her. She has also confirmed [facility] doesn't require a referral to Podiatry, she will go online to choose an Occupational Psychologist. She is also changing PCP's, going to [facility] on 02/19/24.  No other needs today.

## 2024-02-04 NOTE — Patient Instructions (Addendum)
 Visit Information  Thank you for taking time to visit with me today. If you need further assistance, please contact [employer] Dual Complete Care Navigator at [phone] (this number is on the back of your [facility] insurance card).  You may also contact me if I can be of assistance to you.    Your next care management appointment is no further scheduled appointments.    Closing From: Complex Care Management.   Please call the [place]  National Suicide Prevention Lifeline: 301-686-5794 or TTY: 804-248-9388 TTY 980-839-0090) to talk to a trained counselor if you are experiencing a Mental Health or Behavioral Health Crisis or need someone to talk to.  [name] Stamp BSN, CCM [place]  [facility] Population Health RN Care Manager Direct Dial: [phone]  Fax: [phone]

## 2024-02-05 ENCOUNTER — Encounter: Payer: Self-pay | Admitting: Dermatology

## 2024-02-05 ENCOUNTER — Ambulatory Visit: Admitting: Dermatology

## 2024-02-05 VITALS — BP 139/90

## 2024-02-05 DIAGNOSIS — L6681 Central centrifugal cicatricial alopecia: Secondary | ICD-10-CM

## 2024-02-05 DIAGNOSIS — R238 Other skin changes: Secondary | ICD-10-CM

## 2024-02-05 MED ORDER — SAFETY SEAL MISCELLANEOUS MISC: 1.0000 | Freq: Every morning | 6 refills | Status: AC

## 2024-02-05 MED ORDER — TRIAMCINOLONE ACETONIDE 10 MG/ML IJ SUSP
10.0000 mg | Freq: Once | INTRAMUSCULAR | Status: AC
  Administered 2024-02-05: 10 mg via INTRADERMAL

## 2024-02-05 NOTE — Progress Notes (Signed)
" ° °  Follow-Up Visit   Subjective  Lindsay [name] is a 54 y.o. female who presents for the following: [facility] follow up - It seems like she is losing more hair. She was using Minoxidil/Clobetasol but she ran out about a month ago. She also ran out of Viviscal and Vital Proteins and has not been able get more. Last OV was 04/16/2023.   The following portions of the chart were reviewed this encounter and updated as appropriate: medications, allergies, medical history  Review of Systems:  No other skin or systemic complaints except as noted in HPI or Assessment and Plan.  Objective  Well appearing patient in no apparent distress; mood and affect are within normal limits.   A focused examination was performed of the following areas: Scalp   Relevant exam findings are noted in the Assessment and Plan.  Scalp scarring alopecia at vertex/crown of scalp  Assessment & Plan   Central centrifugal cicatricial alopecia Scarring alopecia with stable top of the scalp and new growth on the sides. Burning sensation indicates active inflammation. Scarred follicles will not regrow, but goal is to prevent further spread and promote regrowth in non-scarred areas. - Administered Kenalog  10 mg, 0.3 cc injections to affected areas to reduce inflammation and promote hair regrowth. - Refilled Med Roc prescription. - Recommended continuation of Viviscal and vital proteins supplements. - Recommended collagen peptides as a supplement to promote stronger hair regrowth. - Scheduled follow-up appointment in May for another set of injections. CENTRAL CENTRIFUGAL CICATRICIAL ALOPECIA   This Visit - triamcinolone  acetonide (KENALOG ) 10 MG/ML injection 10 mg SCALP IRRITATION Scalp Restart AA Gel (Minoxidil/Clobetasol) daily - sent to [facility]  Restart Viviscal and Vital Proteins Collagen Peptides - Intralesional injection - Scalp Location: scalp  Informed Consent: Discussed risks (infection, pain,  bleeding, bruising, thinning of the skin, loss of skin pigment, lack of resolution, and recurrence of lesion) and benefits of the procedure, as well as the alternatives. Informed consent was obtained. Preparation: The area was prepared a standard fashion.  Anesthesia: none  Procedure Details: An intralesional injection was performed with Kenalog  10 mg/cc. 0.3 cc in total were injected.  Total number of injections: 10  Plan: The patient was instructed on post-op care. Recommend OTC analgesia as needed for pain.   This Visit - Safety Seal Miscellaneous MISC - Apply 1 Application topically in the morning. Medication Name; AA Gel  Return in about 4 months (around 06/04/2024) for Alopecia.  I, Roseline Hutchinson, CMA, am acting as scribe for [employer], DO .   Documentation: I have reviewed the above documentation for accuracy and completeness, and I agree with the above.  Delon Lenis, DO    "

## 2024-02-05 NOTE — Patient Instructions (Addendum)
 VISIT SUMMARY:  Today, we discussed your scarring alopecia and the burning and itching sensations you have been experiencing on your scalp. We reviewed your current treatment plan and made some adjustments to help manage your symptoms and promote hair regrowth.  YOUR PLAN:  -CENTRAL CENTRIFUGAL CICATRICIAL ALOPECIA:  This is a type of scarring alopecia that causes hair loss and scarring on the scalp. The goal is to prevent further spread and promote regrowth in non-scarred areas.   Today, you received Kenalog  injections to reduce inflammation and promote hair regrowth.   Your prescription for the topical compound with clobetasol and minoxidil was refilled. You should continue taking Viviscal and vital proteins collagen supplements, and consider adding collagen peptides to your regimen to promote stronger hair regrowth.  INSTRUCTIONS:  Please schedule a follow-up appointment in May for another set of injections. Continue using your prescribed medications and supplements as discussed.   Important Information  Due to recent changes in healthcare laws, you may see results of your pathology and/or laboratory studies on MyChart before the doctors have had a chance to review them. We understand that in some cases there may be results that are confusing or concerning to you. Please understand that not all results are received at the same time and often the doctors may need to interpret multiple results in order to provide you with the best plan of care or course of treatment. Therefore, we ask that you please give us  2 business days to thoroughly review all your results before contacting the office for clarification. Should we see a critical lab result, you will be contacted sooner.   If You Need Anything After Your Visit  If you have any questions or concerns for your doctor, please call our main line at [phone] If no one answers, please leave a voicemail as directed and we will return your  call as soon as possible. Messages left after 4 pm will be answered the following business day.   You may also send us  a message via MyChart. We typically respond to MyChart messages within 1-2 business days.  For prescription refills, please ask your pharmacy to contact our office. Our fax number is [phone].  If you have an urgent issue when the clinic is closed that cannot wait until the next business day, you can page your doctor at the number below.    Please note that while we do our best to be available for urgent issues outside of office hours, we are not available 24/7.   If you have an urgent issue and are unable to reach us , you may choose to seek medical care at your doctor's office, retail clinic, urgent care center, or emergency room.  If you have a medical emergency, please immediately call 911 or go to the emergency department. In the event of inclement weather, please call our main line at [phone] for an update on the status of any delays or closures.  Dermatology Medication Tips: Please keep the boxes that topical medications come in in order to help keep track of the instructions about where and how to use these. Pharmacies typically print the medication instructions only on the boxes and not directly on the medication tubes.   If your medication is too expensive, please contact our office at [phone] or send us  a message through MyChart.   We are unable to tell what your co-pay for medications will be in advance as this is different depending on your insurance coverage. However, we may be  able to find a substitute medication at lower cost or fill out paperwork to get insurance to cover a needed medication.   If a prior authorization is required to get your medication covered by your insurance company, please allow us  1-2 business days to complete this process.  Drug prices often vary depending on where the prescription is filled and some pharmacies may offer  cheaper prices.  The website www.goodrx.com contains coupons for medications through different pharmacies. The prices here do not account for what the cost may be with help from insurance (it may be cheaper with your insurance), but the website can give you the price if you did not use any insurance.  - You can print the associated coupon and take it with your prescription to the pharmacy.  - You may also stop by our office during regular business hours and pick up a GoodRx coupon card.  - If you need your prescription sent electronically to a different pharmacy, notify our office through [facility] or by phone at [phone]

## 2024-02-23 ENCOUNTER — Ambulatory Visit (HOSPITAL_COMMUNITY): Admitting: Licensed Clinical Social Worker

## 2024-02-23 ENCOUNTER — Other Ambulatory Visit: Payer: Self-pay | Admitting: Physician Assistant

## 2024-02-23 DIAGNOSIS — F4312 Post-traumatic stress disorder, chronic: Secondary | ICD-10-CM | POA: Diagnosis not present

## 2024-02-23 DIAGNOSIS — F410 Panic disorder [episodic paroxysmal anxiety] without agoraphobia: Secondary | ICD-10-CM

## 2024-02-23 DIAGNOSIS — F331 Major depressive disorder, recurrent, moderate: Secondary | ICD-10-CM

## 2024-02-23 DIAGNOSIS — F411 Generalized anxiety disorder: Secondary | ICD-10-CM

## 2024-02-23 NOTE — Progress Notes (Signed)
 Virtual Visit via Video Note  I connected with Lorrinda Hildebran on 02/23/24 at  8:00 AM EST by a video enabled telemedicine application and verified that I am speaking with the correct person using two identifiers.  Location: Patient: home Provider: home office   I discussed the limitations of evaluation and management by telemedicine and the availability of in person appointments. The patient expressed understanding and agreed to proceed.   I discussed the assessment and treatment plan with the patient. The patient was provided an opportunity to ask questions and all were answered. The patient agreed with the plan and demonstrated an understanding of the instructions.   The patient was advised to call back or seek an in-person evaluation if the symptoms worsen or if the condition fails to improve as anticipated.  I provided 46 minutes of non-face-to-face time during this encounter.  THERAPIST PROGRESS NOTE  Session Time: 8:00 AM to 8:46 AM  Participation Level: Active  Behavioral Response: CasualAlertappropriate  Type of Therapy: Individual Therapy  Treatment Goals addressed: Patient reports therapy helps cope with her stressors continue to work on coping strategies to help improve symptoms of her diagnoses, coping in general  ProgressTowards Goals: Progressing-processed thoughts and feelings help with coping worked on current stressors  Interventions: Solution Focused, Strength-based, Supportive, and Other: coping  Summary: Trystin Hargrove is a 54 y.o. female who presents with check in with therapist because therapist has been on vacation. She said hasn't slept in the last couple nights, feels stuffed up not sure what it is, sinuses? It hurts behind her eyes. Her face swollen up, happened one time abscess luckily had cloves, ginger. Same problem in July a woman she knows does natural stuff, numbs the pain. Laying there had nasty taste in mouth it busted. For sinus take cold medicine  usually don't have sinus issues like that. Why not sleeping? Different things on mind for a long period of time. Doze off and crazy dreams. Scary but not scary. If doze off wake up and heart beat fast. Therapist talked about the neuroscience and negative networks and the idea is to get out of them can do with cognitive restructuring but hard. [place] health issues patient POA see what is going on. They are telling her a lot of different stuff going to find out like what has happened to patient patient drawing from her experience nobody knows what is going on thinks what it means. Main thing in reference to her is the court issues. Therapist noted this situation is a perfect set up for anxiety.   Already went to PCP at [facility] tests asks patient about plan and patient said to advocate with other specialists. Find ou there baseline what is going on stomach main issue is swollen and breathing. There were tests can't put her to sleep. PCP referring her to GI doctor didn't understand patient diagnosis of cirrhosis of liver and plan to monitor didn't set well with him. She is not doing anything. Therapist noted that as her impression looking at things over a period of time. Patient likes heart doctor. Therapist noted seems like heart doctor listening to her.  As patient finds therapy helpful therapist noted one of the best ways to deal with it is say it out loud. Aunt come up in several dreams past two months since 12-03-24. Know died but in dream telling patient something to do or passed away. In dream one time a kid and one time an adult. Patient was her caregiver ulcers on  leg dressings changed three times a day. She was 94/5 mom had to get surgery Achille's tendon crutches for many years, cast on couldn't move around to different parts of the town. Back and forth from aunt's to mom. Mom acute tendonitis of foot, bone spurs surgery didn't do any good back and forth every day. Stressful. How old was she? 90's. When  Mom got better worked at [employer] 16 hours on weekends then Tuesday. Other sister was there. Therapist noted that being a caretaker becomes all consuming patient said more aunt than mom. Therapist asked what patient thinks about dreams, if they mean something and patient thinks they do. Therapist asked what does think dreams about aunt mean. Has dream books but patient just prayed.  Therapist remembers something that patient said that stuck with her that you can go to a place where you and god meet no matter what going on and where at. Place where God and you meet up only you and God know, your own personal journey. Can meet him anywhere praying in place and praying to him he is there, know he is there and pray there. Therapist noted this experience happening to her in nature and helps to just listen. Confused since last time talk to therapist and doesn't know why. Things supposed to know about and don't. Family says she doesn't remember really bad. Therapist noted for brain engagement good for brain, positive activities good. Patient said maybe talk to doctor figure out why don't remember things, dazed, dizzy. Can't remember things, confused more has to say something. Going in three weeks. Can't keep going on like this this direction patient said could be health issues. Therapist noted the mind body connection what happens in body affects brain. Interacting is a good other activities good for brain.  Therapist was able to review events reconnect with patient provide support and space for patient to process thoughts and feelings in session.     Suicidal/Homicidal: No  Plan: Return again in 1 month. 2.  Current stressors utilize session processed thoughts and feelings help with coping  Diagnosis: Major depressive disorder, recurrent, moderate, generalized anxiety disorder, chronic PTSD, panic attacks   Collaboration of Care: Other none needed  Patient/Guardian was advised Release of Information must be  obtained prior to any record release in order to collaborate their care with an outside provider. Patient/Guardian was advised if they have not already done so to contact the registration department to sign all necessary forms in order for us  to release information regarding their care.   Consent: Patient/Guardian gives verbal consent for treatment and assignment of benefits for services provided during this visit. Patient/Guardian expressed understanding and agreed to proceed.   Ronal Sink, LCSW 02/23/2024

## 2024-02-24 ENCOUNTER — Other Ambulatory Visit (HOSPITAL_COMMUNITY): Payer: Self-pay | Admitting: Psychiatry

## 2024-02-24 DIAGNOSIS — F331 Major depressive disorder, recurrent, moderate: Secondary | ICD-10-CM

## 2024-02-26 ENCOUNTER — Other Ambulatory Visit (HOSPITAL_COMMUNITY): Payer: Self-pay | Admitting: *Deleted

## 2024-02-26 ENCOUNTER — Other Ambulatory Visit (HOSPITAL_COMMUNITY): Payer: Self-pay | Admitting: Psychiatry

## 2024-02-26 ENCOUNTER — Ambulatory Visit: Admitting: Adult Health

## 2024-02-26 DIAGNOSIS — F331 Major depressive disorder, recurrent, moderate: Secondary | ICD-10-CM

## 2024-02-26 DIAGNOSIS — F4312 Post-traumatic stress disorder, chronic: Secondary | ICD-10-CM

## 2024-02-26 DIAGNOSIS — F411 Generalized anxiety disorder: Secondary | ICD-10-CM

## 2024-02-26 MED ORDER — LAMOTRIGINE 100 MG PO TABS: ORAL_TABLET | ORAL | 0 refills | Status: AC

## 2024-02-26 MED ORDER — DOXEPIN HCL 25 MG PO CAPS: 25.0000 mg | ORAL_CAPSULE | Freq: Every day | ORAL | 0 refills | Status: AC

## 2024-02-28 ENCOUNTER — Other Ambulatory Visit: Payer: Self-pay | Admitting: Physician Assistant

## 2024-03-08 ENCOUNTER — Telehealth (HOSPITAL_COMMUNITY): Admitting: Psychiatry

## 2024-03-10 ENCOUNTER — Ambulatory Visit: Admitting: Pulmonary Disease

## 2024-03-22 ENCOUNTER — Ambulatory Visit (HOSPITAL_COMMUNITY): Admitting: Licensed Clinical Social Worker

## 2024-04-06 ENCOUNTER — Ambulatory Visit (HOSPITAL_COMMUNITY): Admitting: Licensed Clinical Social Worker

## 2024-04-20 ENCOUNTER — Ambulatory Visit (HOSPITAL_COMMUNITY): Admitting: Licensed Clinical Social Worker

## 2024-05-03 ENCOUNTER — Ambulatory Visit (HOSPITAL_COMMUNITY): Admitting: Licensed Clinical Social Worker

## 2024-06-14 ENCOUNTER — Ambulatory Visit: Admitting: Dermatology
